# Patient Record
Sex: Female | Born: 1940
Health system: Southern US, Community
[De-identification: ages and names within clinical notes are randomized; demographics above are authoritative.]

## PROBLEM LIST (undated history)

## (undated) DIAGNOSIS — K449 Diaphragmatic hernia without obstruction or gangrene: Secondary | ICD-10-CM

## (undated) DIAGNOSIS — E222 Syndrome of inappropriate secretion of antidiuretic hormone: Secondary | ICD-10-CM

## (undated) DIAGNOSIS — Z87442 Personal history of urinary calculi: Secondary | ICD-10-CM

## (undated) DIAGNOSIS — K589 Irritable bowel syndrome without diarrhea: Secondary | ICD-10-CM

## (undated) DIAGNOSIS — K279 Peptic ulcer, site unspecified, unspecified as acute or chronic, without hemorrhage or perforation: Secondary | ICD-10-CM

## (undated) DIAGNOSIS — F419 Anxiety disorder, unspecified: Secondary | ICD-10-CM

## (undated) DIAGNOSIS — I720 Aneurysm of carotid artery: Secondary | ICD-10-CM

## (undated) DIAGNOSIS — J439 Emphysema, unspecified: Secondary | ICD-10-CM

## (undated) DIAGNOSIS — R519 Headache, unspecified: Secondary | ICD-10-CM

## (undated) DIAGNOSIS — C7931 Secondary malignant neoplasm of brain: Secondary | ICD-10-CM

## (undated) DIAGNOSIS — T4145XA Adverse effect of unspecified anesthetic, initial encounter: Secondary | ICD-10-CM

## (undated) DIAGNOSIS — C3490 Malignant neoplasm of unspecified part of unspecified bronchus or lung: Secondary | ICD-10-CM

## (undated) DIAGNOSIS — J449 Chronic obstructive pulmonary disease, unspecified: Secondary | ICD-10-CM

## (undated) DIAGNOSIS — J189 Pneumonia, unspecified organism: Secondary | ICD-10-CM

## (undated) DIAGNOSIS — R0602 Shortness of breath: Secondary | ICD-10-CM

## (undated) DIAGNOSIS — Z9981 Dependence on supplemental oxygen: Secondary | ICD-10-CM

## (undated) DIAGNOSIS — D500 Iron deficiency anemia secondary to blood loss (chronic): Secondary | ICD-10-CM

## (undated) DIAGNOSIS — I5189 Other ill-defined heart diseases: Secondary | ICD-10-CM

## (undated) DIAGNOSIS — F329 Major depressive disorder, single episode, unspecified: Secondary | ICD-10-CM

## (undated) DIAGNOSIS — C801 Malignant (primary) neoplasm, unspecified: Secondary | ICD-10-CM

## (undated) DIAGNOSIS — K5790 Diverticulosis of intestine, part unspecified, without perforation or abscess without bleeding: Secondary | ICD-10-CM

## (undated) DIAGNOSIS — IMO0002 Reserved for concepts with insufficient information to code with codable children: Secondary | ICD-10-CM

## (undated) DIAGNOSIS — I719 Aortic aneurysm of unspecified site, without rupture: Secondary | ICD-10-CM

## (undated) DIAGNOSIS — E039 Hypothyroidism, unspecified: Secondary | ICD-10-CM

## (undated) DIAGNOSIS — F32A Depression, unspecified: Secondary | ICD-10-CM

## (undated) DIAGNOSIS — G500 Trigeminal neuralgia: Secondary | ICD-10-CM

## (undated) HISTORY — PX: CHOLECYSTECTOMY: SHX55

## (undated) HISTORY — DX: Emphysema, unspecified: J43.9

## (undated) HISTORY — DX: Peptic ulcer, site unspecified, unspecified as acute or chronic, without hemorrhage or perforation: K27.9

## (undated) HISTORY — DX: Other ill-defined heart diseases: I51.89

## (undated) HISTORY — DX: Irritable bowel syndrome, unspecified: K58.9

## (undated) HISTORY — DX: Chronic obstructive pulmonary disease, unspecified: J44.9

## (undated) HISTORY — PX: FRACTURE SURGERY: SHX138

## (undated) HISTORY — DX: Malignant neoplasm of unspecified part of unspecified bronchus or lung: C34.90

## (undated) HISTORY — PX: OTHER SURGICAL HISTORY: SHX169

## (undated) HISTORY — DX: Aneurysm of carotid artery: I72.0

## (undated) HISTORY — DX: Diverticulosis of intestine, part unspecified, without perforation or abscess without bleeding: K57.90

## (undated) HISTORY — DX: Syndrome of inappropriate secretion of antidiuretic hormone: E22.2

## (undated) HISTORY — DX: Trigeminal neuralgia: G50.0

## (undated) HISTORY — DX: Depression, unspecified: F32.A

## (undated) HISTORY — DX: Anxiety disorder, unspecified: F41.9

## (undated) HISTORY — PX: CATARACT EXTRACTION: SUR2

## (undated) HISTORY — DX: Aortic aneurysm of unspecified site, without rupture: I71.9

## (undated) HISTORY — DX: Hypothyroidism, unspecified: E03.9

## (undated) HISTORY — PX: BRAIN SURGERY: SHX531

## (undated) HISTORY — PX: ABDOMINAL HYSTERECTOMY: SHX81

## (undated) HISTORY — DX: Malignant (primary) neoplasm, unspecified: C80.1

## (undated) HISTORY — DX: Diaphragmatic hernia without obstruction or gangrene: K44.9

## (undated) HISTORY — PX: TUBAL LIGATION: SHX77

## (undated) HISTORY — DX: Iron deficiency anemia secondary to blood loss (chronic): D50.0

## (undated) HISTORY — PX: KNEE SURGERY: SHX244

## (undated) HISTORY — DX: Major depressive disorder, single episode, unspecified: F32.9

## (undated) HISTORY — DX: Secondary malignant neoplasm of brain: C79.31

---

## 2001-04-17 ENCOUNTER — Other Ambulatory Visit: Admission: RE | Admit: 2001-04-17 | Discharge: 2001-04-17 | Payer: Self-pay | Admitting: Family Medicine

## 2007-08-16 ENCOUNTER — Emergency Department (HOSPITAL_COMMUNITY): Admission: EM | Admit: 2007-08-16 | Discharge: 2007-08-16 | Payer: Self-pay | Admitting: Emergency Medicine

## 2008-12-06 LAB — HM DEXA SCAN

## 2009-06-18 IMAGING — CR DG WRIST COMPLETE 3+V*L*
4 series · 4 of 4 positions shown · non-contrast
Comparison: none

CLINICAL DATA: Fall.   Left wrist injury and pain.
LEFT WRIST:

[view not recorded (1 of 4)]
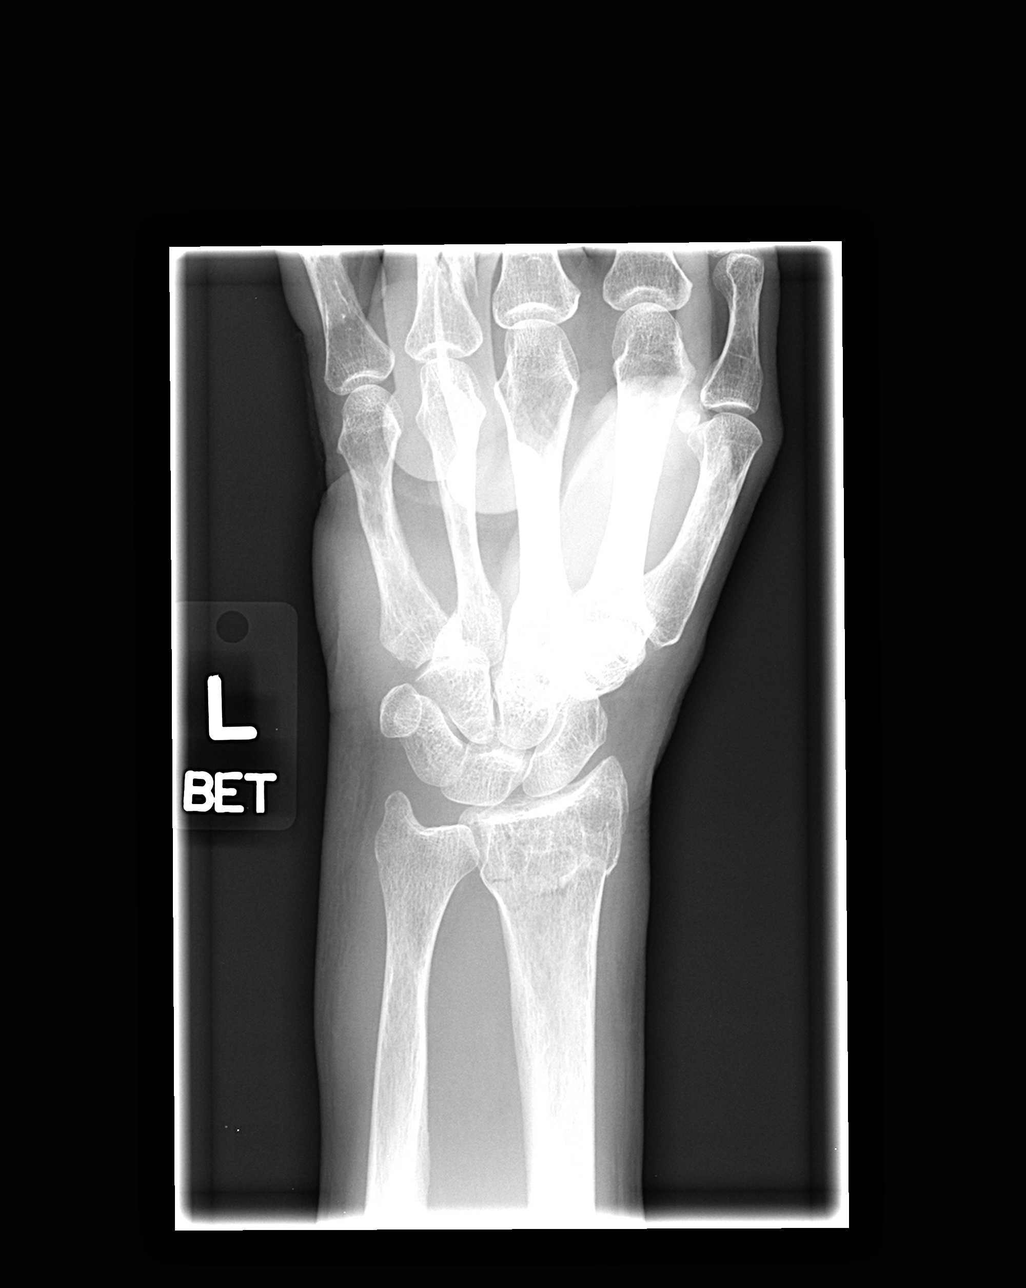

[view not recorded (2 of 4)]
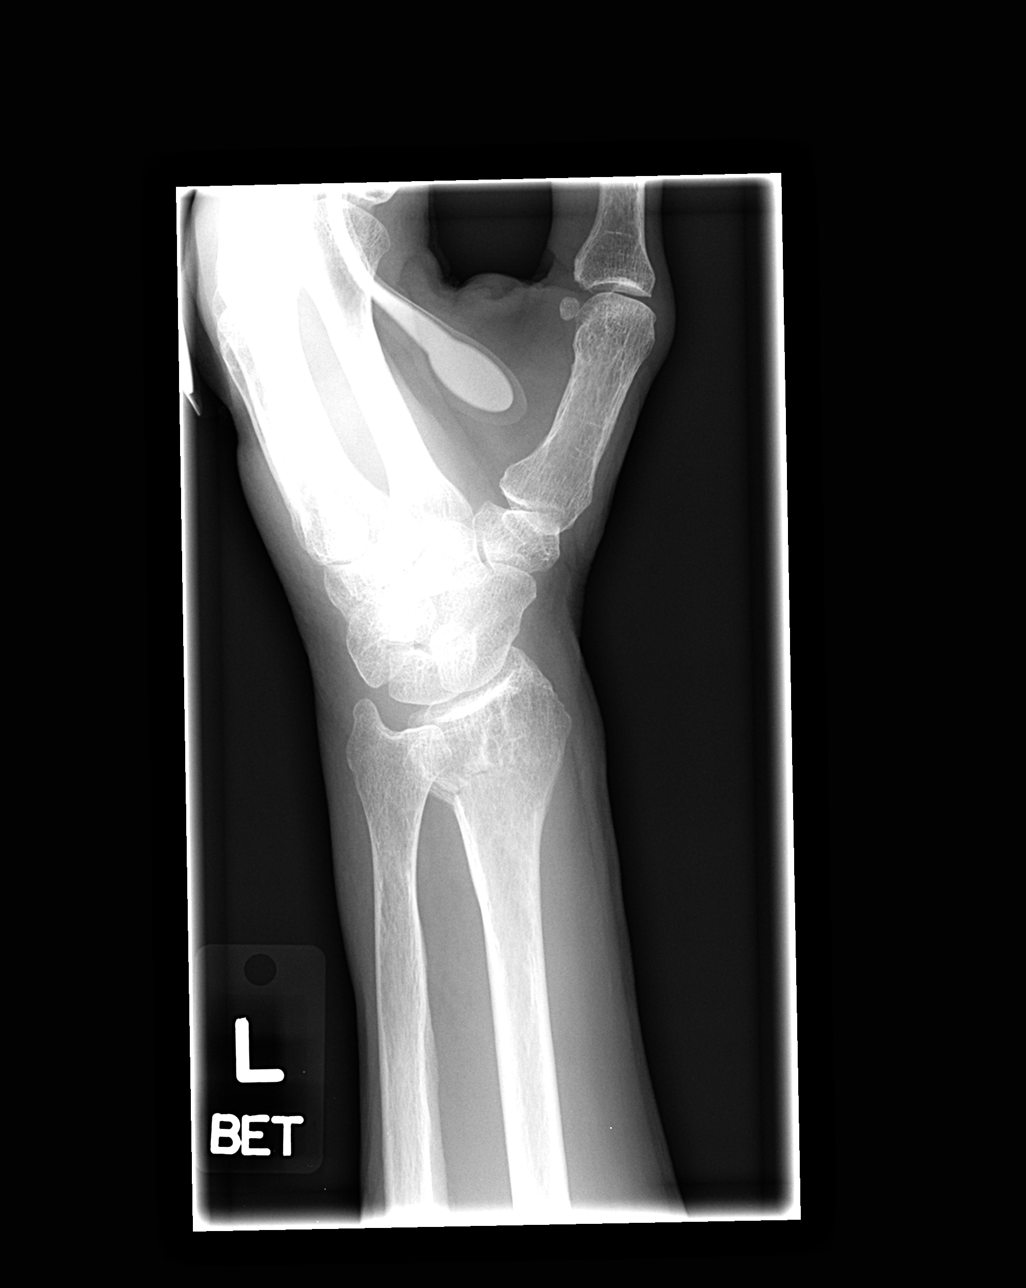

[view not recorded (3 of 4)]
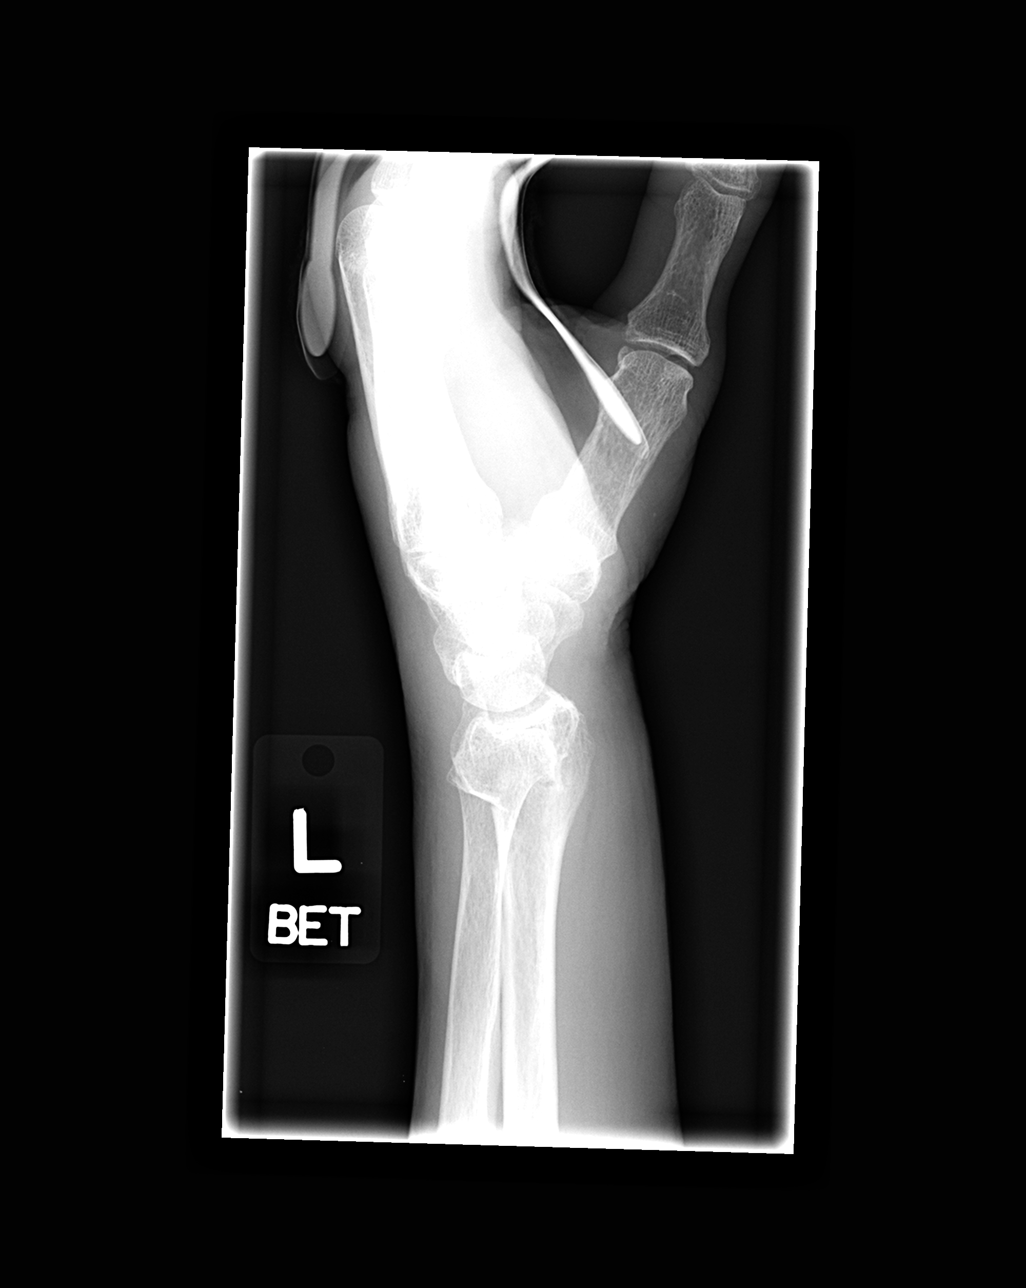

[view not recorded (4 of 4)]
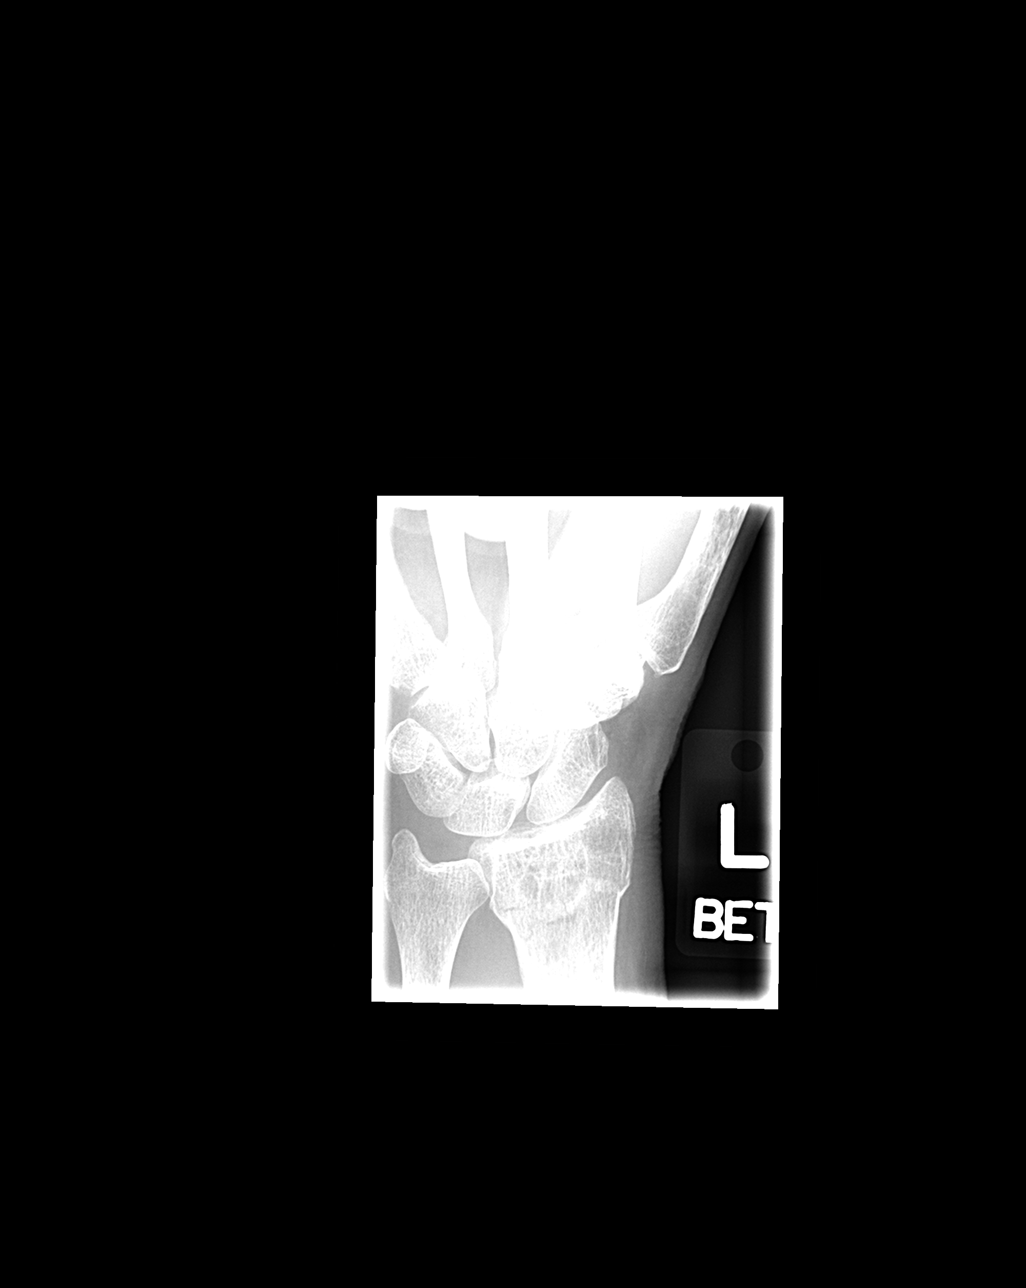

[4 of 4 positions shown; findings below may reference images not displayed]

FINDINGS: A fracture of the distal radial metaphysis is seen with extension into the distal radial and ulnar joint.  There is mild dorsal angulation of the distal articular surface of the radius.  No other fractures are identified and there is no evidence of dislocation.
IMPRESSION: Distal radial metaphyseal fracture, with involvement of the distal radial-ulnar joint and dorsal angulation.

## 2009-06-18 IMAGING — CR DG NASAL BONES 3+V
3 series · 3 of 3 positions shown · non-contrast
Comparison: none

CLINICAL DATA: Fall. Nasal trauma and pain.
 NASAL BONES ? 3 VIEW:

[view not recorded (1 of 3)]
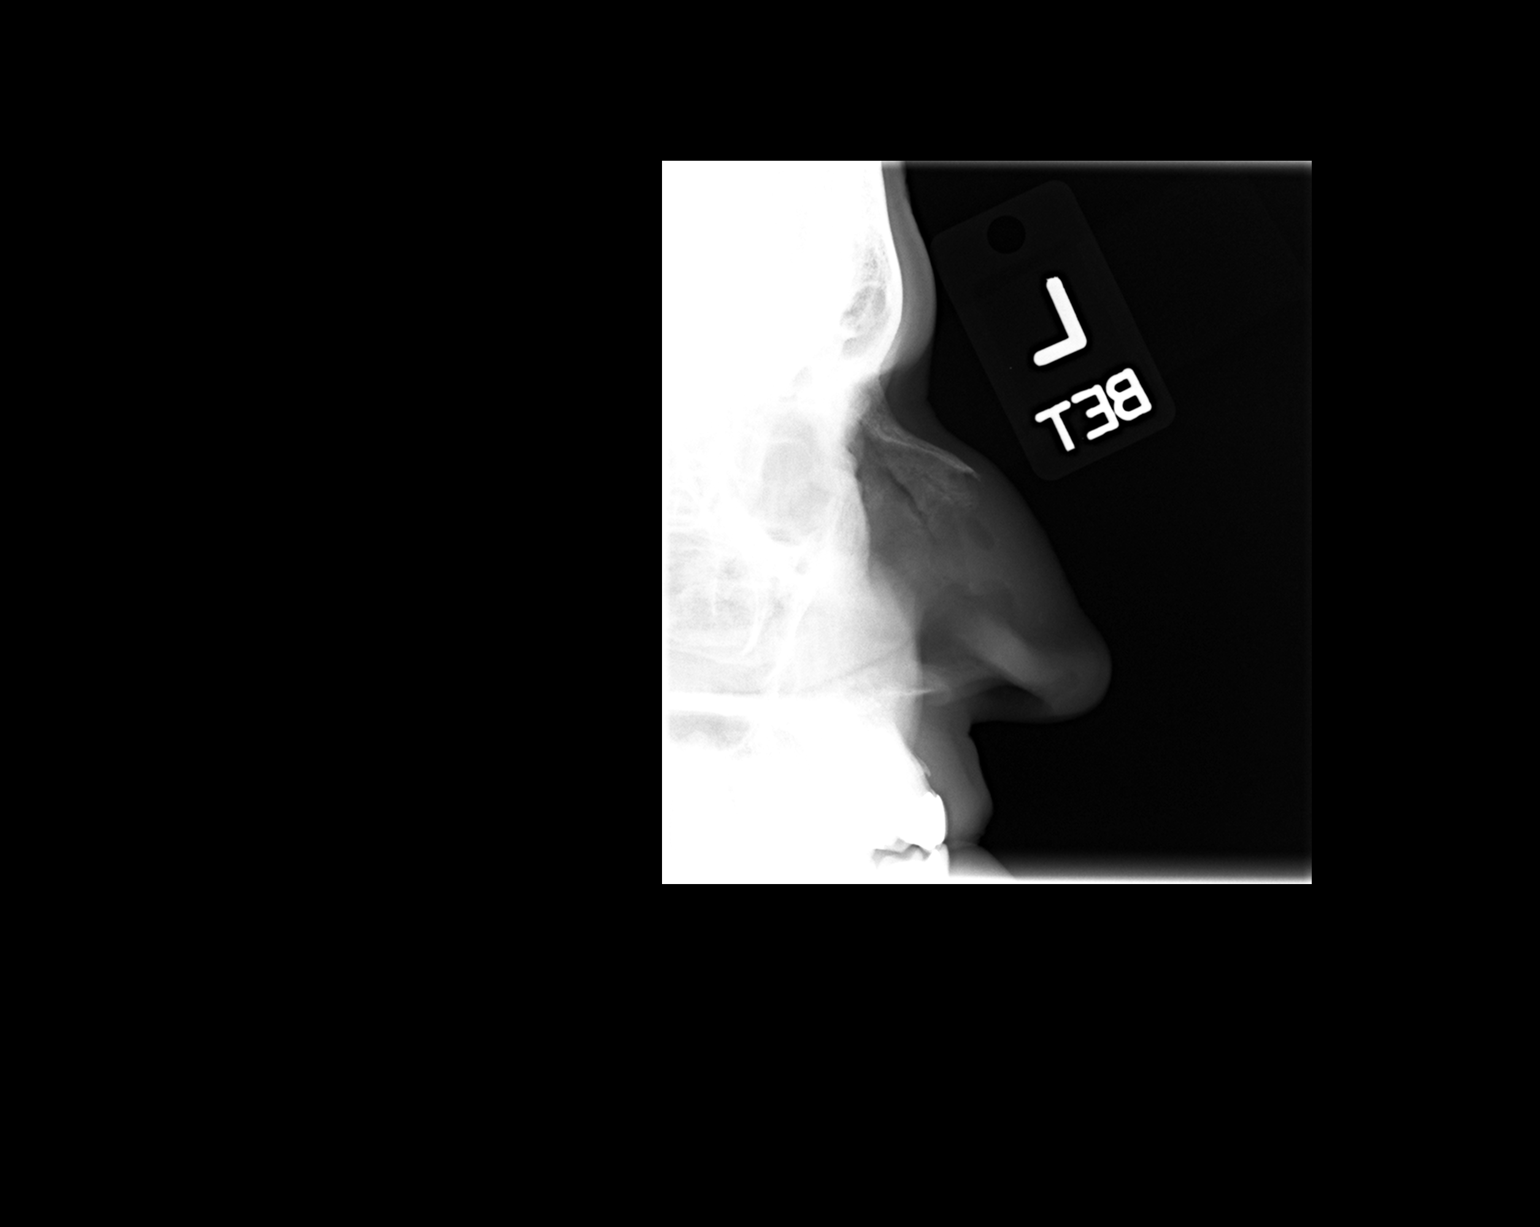

[view not recorded (2 of 3)]
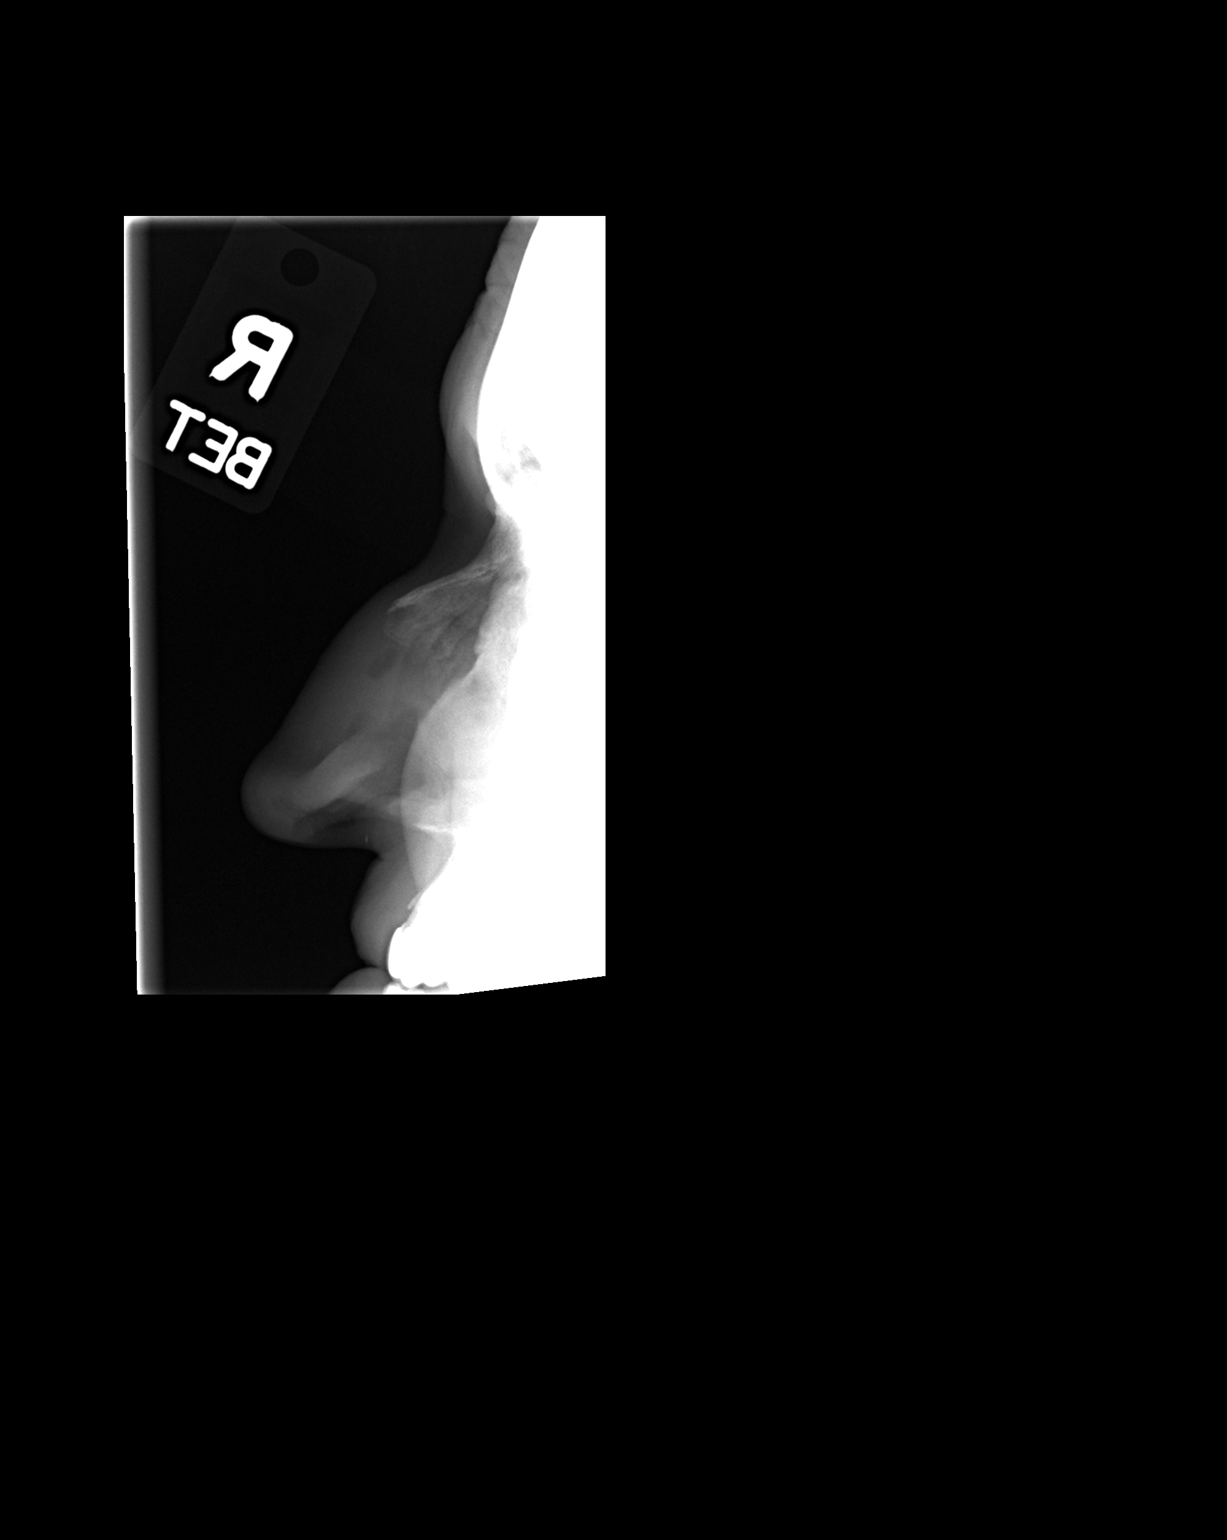

[view not recorded (3 of 3)]
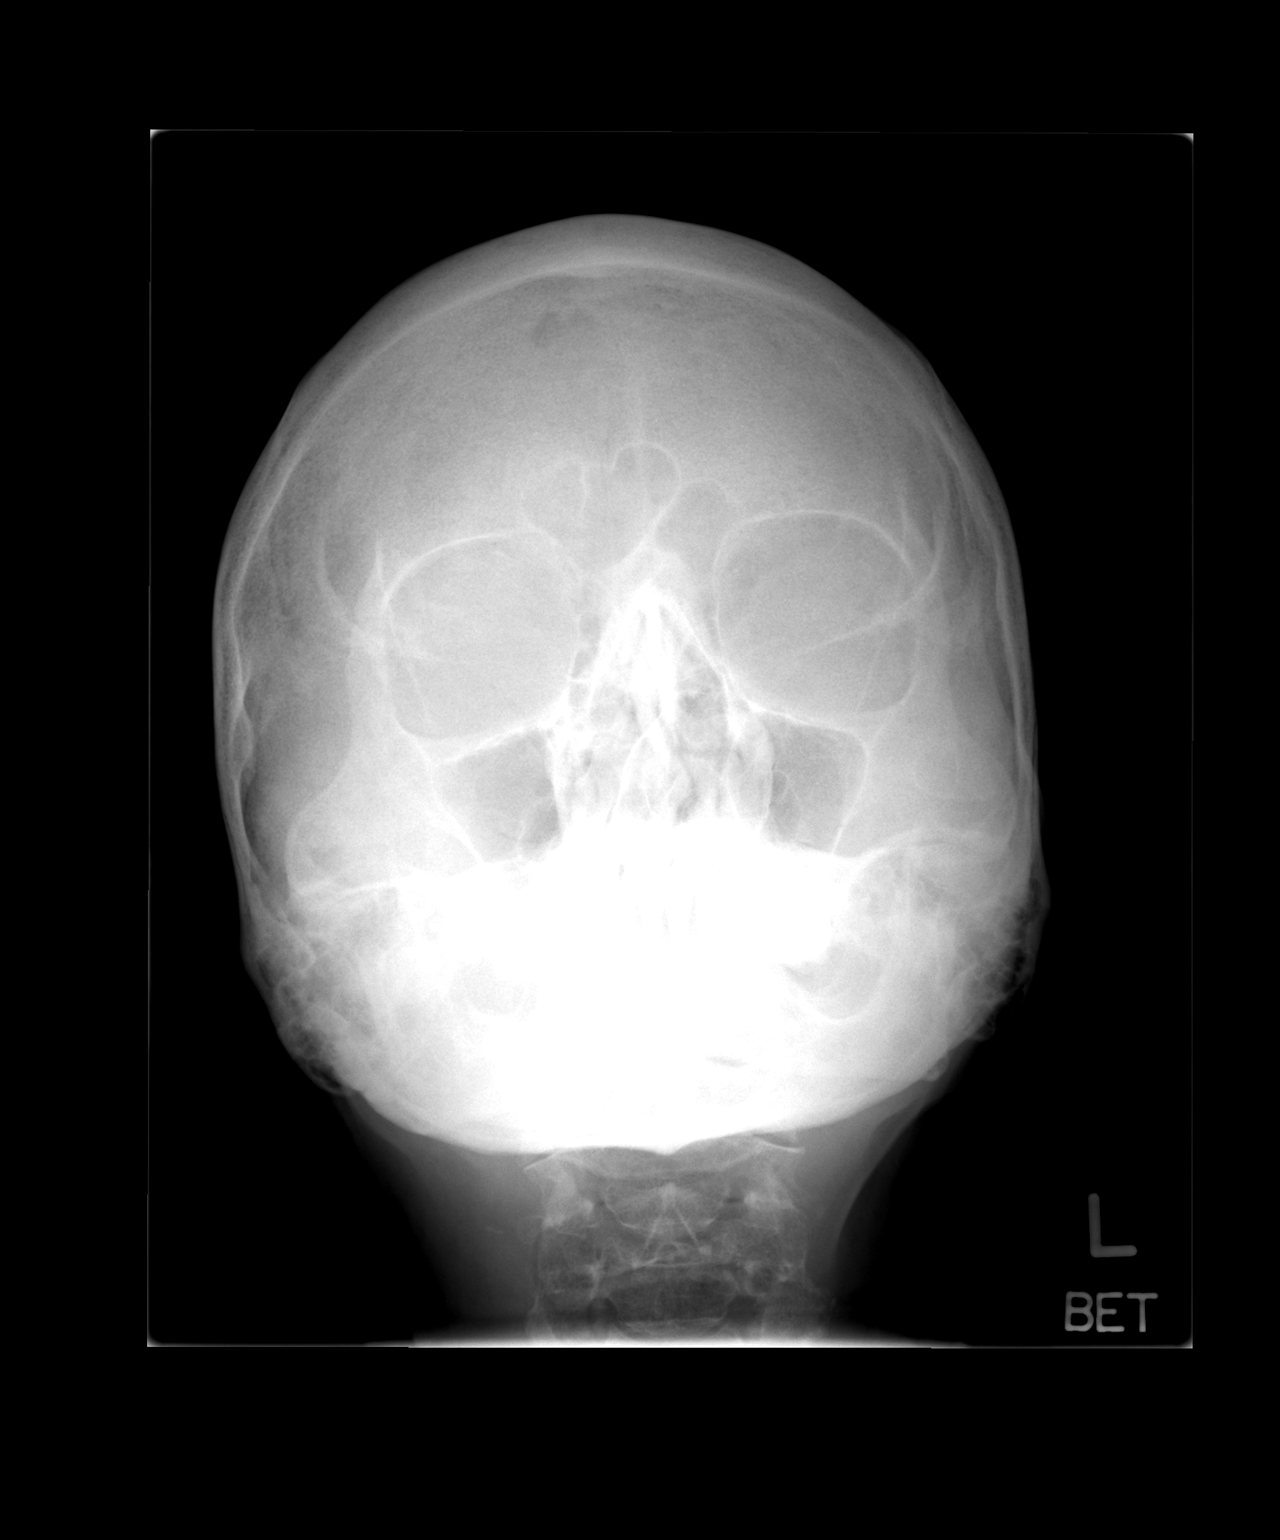

[3 of 3 positions shown; findings below may reference images not displayed]

FINDINGS: There is no evidence of nasal bone fracture.  Anterior maxillary spine is intact.
IMPRESSION: No evidence of fracture.

## 2009-07-08 LAB — HM MAMMOGRAPHY

## 2009-10-08 DIAGNOSIS — T8859XA Other complications of anesthesia, initial encounter: Secondary | ICD-10-CM

## 2009-10-08 DIAGNOSIS — T4145XA Adverse effect of unspecified anesthetic, initial encounter: Secondary | ICD-10-CM

## 2009-10-08 HISTORY — DX: Adverse effect of unspecified anesthetic, initial encounter: T41.45XA

## 2009-10-08 HISTORY — DX: Other complications of anesthesia, initial encounter: T88.59XA

## 2009-10-08 HISTORY — PX: COLONOSCOPY: SHX174

## 2009-11-01 ENCOUNTER — Ambulatory Visit: Payer: Self-pay | Admitting: Gastroenterology

## 2009-11-01 DIAGNOSIS — R1310 Dysphagia, unspecified: Secondary | ICD-10-CM | POA: Insufficient documentation

## 2009-11-09 ENCOUNTER — Encounter: Payer: Self-pay | Admitting: Gastroenterology

## 2009-11-11 ENCOUNTER — Ambulatory Visit (HOSPITAL_COMMUNITY): Admission: RE | Admit: 2009-11-11 | Discharge: 2009-11-11 | Payer: Self-pay | Admitting: Gastroenterology

## 2009-11-11 ENCOUNTER — Ambulatory Visit: Payer: Self-pay | Admitting: Gastroenterology

## 2009-11-13 HISTORY — PX: ESOPHAGOGASTRODUODENOSCOPY: SHX1529

## 2009-11-14 ENCOUNTER — Encounter: Payer: Self-pay | Admitting: Gastroenterology

## 2009-11-18 ENCOUNTER — Telehealth (INDEPENDENT_AMBULATORY_CARE_PROVIDER_SITE_OTHER): Payer: Self-pay

## 2009-11-21 ENCOUNTER — Ambulatory Visit (HOSPITAL_COMMUNITY): Admission: RE | Admit: 2009-11-21 | Discharge: 2009-11-21 | Payer: Self-pay | Admitting: Gastroenterology

## 2009-11-21 ENCOUNTER — Ambulatory Visit: Payer: Self-pay | Admitting: Gastroenterology

## 2009-11-21 HISTORY — PX: ESOPHAGOGASTRODUODENOSCOPY: SHX1529

## 2009-12-30 ENCOUNTER — Ambulatory Visit: Payer: Self-pay | Admitting: Gastroenterology

## 2009-12-30 DIAGNOSIS — Z91041 Radiographic dye allergy status: Secondary | ICD-10-CM | POA: Insufficient documentation

## 2009-12-30 DIAGNOSIS — Z8719 Personal history of other diseases of the digestive system: Secondary | ICD-10-CM | POA: Insufficient documentation

## 2010-01-06 ENCOUNTER — Telehealth (INDEPENDENT_AMBULATORY_CARE_PROVIDER_SITE_OTHER): Payer: Self-pay

## 2010-03-28 ENCOUNTER — Telehealth (INDEPENDENT_AMBULATORY_CARE_PROVIDER_SITE_OTHER): Payer: Self-pay

## 2010-03-28 ENCOUNTER — Encounter: Payer: Self-pay | Admitting: Gastroenterology

## 2010-08-03 ENCOUNTER — Ambulatory Visit: Payer: Self-pay | Admitting: Gastroenterology

## 2010-08-22 ENCOUNTER — Telehealth (INDEPENDENT_AMBULATORY_CARE_PROVIDER_SITE_OTHER): Payer: Self-pay

## 2010-10-31 ENCOUNTER — Encounter
Admission: RE | Admit: 2010-10-31 | Discharge: 2010-10-31 | Payer: Self-pay | Source: Home / Self Care | Attending: Family Medicine | Admitting: Family Medicine

## 2010-11-01 ENCOUNTER — Encounter
Admission: RE | Admit: 2010-11-01 | Discharge: 2010-11-01 | Payer: Self-pay | Source: Home / Self Care | Attending: Family Medicine | Admitting: Family Medicine

## 2010-11-07 ENCOUNTER — Other Ambulatory Visit: Payer: Self-pay | Admitting: Thoracic Surgery

## 2010-11-07 ENCOUNTER — Ambulatory Visit
Admission: RE | Admit: 2010-11-07 | Discharge: 2010-11-07 | Payer: Self-pay | Source: Home / Self Care | Attending: Thoracic Surgery | Admitting: Thoracic Surgery

## 2010-11-07 DIAGNOSIS — R918 Other nonspecific abnormal finding of lung field: Secondary | ICD-10-CM

## 2010-11-08 NOTE — Letter (Signed)
November 07, 2010  Priscille Heidelberg. Pamalee Leyden, MD [address] [place], [place] [address]  Re:  BARBETTE, MCGLAUN                 DOB:  03-21-1941  Dear Dr. Tanya Nones:  I appreciate the opportunity of seeing this 70 year old smoker, had a history of left chest pain and cough, and no fever or chills, and was thought to have a left-sided pleurisy.  She underwent a chest x-ray which was normal on November 06, 2010 , but because of continued pain she had a CT scan to rule out pulmonary embolus and showed a 2-cm spiculated mass in the left lower lobe.  She is referred here for evaluation.  She has had no hemoptysis, fever, chills, excessive sputum.  PAST MEDICAL HISTORY:  Significant for trigeminal neuralgia on the left side and hypothyroidism.  She has vitamin D deficiency.  She also has history of tobacco abuse, diverticulitis.  MEDICATIONS: 1. Levothyroxine 25 mcg day. 2. Fosamax 70 mg weekly. 3. Celexa 40 mg daily. 4. Albuterol p.r.n.  ALLERGIES:  She is allergic to sulfa and contrast dyes.  SOCIAL HISTORY:  She had a previous hysterectomy, cholecystectomy, and a Bartholin gland cyst.  FAMILY HISTORY:  Noncontributory.  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 Smokes half to one pack a day.  Does not drink alcohol on a regular basis.  REVIEW OF SYSTEMS:  GENERAL:  She is 5 feet 5-1/2 inches.  In general, her weight has been stable. CARDIAC:  She has no angina or atrial fibrillation. PULMONARY:  See history of present illness.  No hemoptysis. GI:  No nausea, vomiting, constipation, or diarrhea. GU:  No kidney disease, dysuria, or frequent urination. VASCULAR:  No claudication, DVT, TIAs. NEUROLOGIC:  No dizziness, headaches, blackouts, or seizures. MUSCULOSKELETAL:  No arthritis. PSYCHIATRIC:  No depression or nervousness. ENT:  No change in eyesight or hearing. HEMATOLOGIC:  No problems with bleeding, clotting disorders, or anemia.  PHYSICAL EXAMINATION:   General:  She had a thin Caucasian female in no acute distress.  Vital Signs:  Her blood pressure is 115/68, pulse 76, respirations 20, sats were 92% on room air.  Head, Eyes, Ears, Nose, and Throat:  Unremarkable.  Neck:  Supple without thyromegaly.  Chest: There are bilateral wheezes and increased AP diameter and distant breath sounds.  Heart:  Regular sinus rhythm.  No murmurs.  Abdomen:  Soft. Extremities:  Pulses are 2+.  There is no clubbing or edema. Neurologic:  She is oriented x3.  Sensory and motor intact.  Cranial nerves intact.  ASSESSMENT AND PLAN:  I am somewhat concerned about this left lower lobe nodule, particularly in an ongoing smoker.  I think to workup we need to do a PET scan and a pulmonary function test with DLCO.  I will see her back again after that to make further recommendations.  I appreciate the opportunity of seeing the patient.  Sincerely,  Ines Bloomer, M.D. Electronically Signed  DPB/MEDQ  D:  11/07/2010  T:  11/08/2010  Job:  [id number]

## 2010-11-09 NOTE — Letter (Signed)
Summary: TCS/EGD ORDER  TCS/EGD ORDER   Imported By: Ricard Dillon 11/09/2009 12:42:09  _____________________________________________________________________  External Attachment:    Type:   Image     Comment:   External Document

## 2010-11-09 NOTE — Progress Notes (Signed)
Summary: omeprazole rx  Phone Note Call from Patient Call back at Home Phone [phone] Call back at [phone]   Caller: Patient Summary of Call: pt called- she is requesting her omeprazole Rx sent to [facility] for mail order. The fax number for them is [phone] Initial call taken by: Hendricks Limes LPN,  August 22, 2010 11:57 AM     Appended Document: omeprazole rx    Prescriptions: OMEPRAZOLE 20 MG CPDR (OMEPRAZOLE) 1 by mouth every morning  #90 x 3   Entered and Authorized by:   Joselyn Arrow FNP-BC   Signed by:   Joselyn Arrow FNP-BC on 08/22/2010   Method used:   Printed then faxed to ...       [facility]  [address] [phone]* (retail)       [address]       [place], [place]  [address]       Ph: [phone] or [phone]       Fax: [phone]   RxID:   [id number]     Appended Document: omeprazole rx rx faxed to fax number given by pt.

## 2010-11-09 NOTE — Miscellaneous (Signed)
Summary: Orders Update  Clinical Lists Changes  Orders: Added new Test order of T-Helicobactor Pylori Antigen Stool ([id number]) - Signed 

## 2010-11-09 NOTE — Assessment & Plan Note (Signed)
Summary: DYSPHAGIA, SCREENING   Visit Type:  Follow-up Visit Primary Care Provider:  Dr. Tanya Nones  Chief Complaint:  dysphagia.  History of Present Illness: Problems swallowing-don't do bread or dry meat well. Problems: none if doesn't eat bread.  Eating cheerios instead ground flax seed. no problems with heartburn. Takign OMP once a day.   Current Medications (verified): 1)  Levothyroxine Sodium 25 Mcg Tabs (Levothyroxine Sodium) .... Once Daily 2)  Celexa 20 Mg Tabs (Citalopram Hydrobromide) .... Once Daily 3)  Vitamin D 4)  Fosamax 70 Mg Tabs (Alendronate Sodium) .... Q Week 5)  Omeprazole 20 Mg Cpdr (Omeprazole) .Marland Kitchen.. 1 By Mouth Every Morning  Allergies (verified): 1)  ! Sulfa 2)  * Contrast Dye  Past History:  Past Medical History: COPD Anxiety Disorder, ON CELEXA FOR 2 YEARS Hypothyroidism PUD 30 years TCS 1997-no polyps, or problems TCS 2011-hyperplastic polyp DYSPHAGIA **ESOPHAGEAL WEB-EGD/DIL 2011  Vital Signs:  Patient profile:   70 year old female Height:      65 inches Weight:      133.50 pounds BMI:     22.30 Temp:     98.6 degrees F oral Pulse rate:   72 / minute BP sitting:   120 / 80  (left arm) Cuff size:   regular  Vitals Entered By: Cloria Spring LPN (August 03, 2010 4:16 PM)  Physical Exam  General:  Well developed, well nourished, no acute distress. Head:  Normocephalic and atraumatic. Lungs:  Clear throughout to auscultation. Heart:  Regular rate and rhythm; no murmurs. Abdomen:  Soft, nontender and nondistended. Normal bowel sounds.  Impression & Recommendations:  Problem # 1:  DYSPHAGIA UNSPECIFIED (ICD-787.20)  Avoid bread and dry meat. Continue OMP. Consider BPE if Sx get worse. OPV in 6 mos.   Orders: Est. Patient Level II ([address])  Problem # 2:  SCREENING, COLON CANCER (ICD-V76.51) Assessment: Comment Only TCS in 10-15 years in benefits outweigh the risks.  CC: PCP  Appended Document: DYSPHAGIA, SCREENING TCS REMINDER IS  IN THE COMPUTER

## 2010-11-09 NOTE — Assessment & Plan Note (Signed)
Summary: H. PYLORI, DYSPHAGIA   Visit Type:  Follow-up Visit Primary Care Provider:  Tanya Nones, M.D.  Chief Complaint:  follow up.  History of Present Illness: Swallowing is better. Stomach is better. Sometimes she feels soo empty. Some days worse than others. Eats 3 meals a days and ?something in middle. Started Fosamax.   Current Medications (verified): 1)  Levothyroxine Sodium 25 Mcg Tabs (Levothyroxine Sodium) .... Once Daily 2)  Celexa 20 Mg Tabs (Citalopram Hydrobromide) .... Once Daily 3)  Vitamin D 4)  Fosamax 70 Mg Tabs (Alendronate Sodium) .... Q Week  Allergies (verified): 1)  ! Sulfa 2)  * Contrast Dye  Past History:  Past Medical History: COPD Anxiety Disorder, ON CELEXA FOR 2 YEARS Hypothyroidism PUD 30 years TCS 1997-no polyps, or problems TCS 2011-hyperplastic polyp ESOPHAGEAL WEB-EGD/DIL 2011  Vital Signs:  Patient profile:   70 year old female Height:      65 inches Weight:      122 pounds BMI:     20.38 Temp:     97.7 degrees F oral Pulse rate:   72 / minute BP sitting:   110 / 70  (left arm) Cuff size:   regular  Vitals Entered By: Hendricks Limes LPN (December 30, 2009 9:36 AM)  Physical Exam  General:  Well developed, well nourished, no acute distress. Head:  Normocephalic and atraumatic. Lungs:  Clear throughout to auscultation. Heart:  Regular rate and rhythm; no murmurs. Abdomen:  Soft, nontender and nondistended. Normal bowel sounds. Neurologic:  Alert and  oriented x4;  grossly normal neurologically.  Impression & Recommendations:  Problem # 1:  HELICOBACTER PYLORI GASTRITIS, HX OF (ICD-V12.79) Assessment Improved  Finished Abx. Continue OMP daily. OPV in 6 mos. encouarged pt to snack between meals to maintain weight.  Orders: Est. Patient Level II ([address])  Problem # 2:  DYSPHAGIA UNSPECIFIED (ICD-787.20) Assessment: Improved  2o to esophageal web. PT HAD LOW BP WITH EGD THE FIRST TIME: DEM 75/V3, BUT NOT THE SECOND TIME: DEM  25/V3. Repeat dilations as needed. Drink plenty of water with Fosamax and stay upright for 30 minutes. OPV in 6 mos.  CC: PCP  Orders: Est. Patient Level II ([address])  Problem # 3:  SCREENING, COLON CANCER (ICD-V76.51) Assessment: Comment Only HYPERPLASTIC POLYP 2011. TCS IN 10-15 YEARS IF BENEFITS OUTWEIGH THE RISKS. Prescriptions: OMEPRAZOLE 20 MG CPDR (OMEPRAZOLE) 1 by mouth every morning  #30 x 5   Entered and Authorized by:   West Bali MD   Signed by:   West Bali MD on 12/30/2009   Method used:   [facility] to        [facility]. [phone]* (retail)       [address]       [place], [place]  [address]       Ph: [phone] or [phone]       Fax: [phone]   RxID:   [id number]   Appended Document: H. PYLORI, DYSPHAGIA NOV 2010 123 LBS, PMHX: IBS     CR 0.65 ALB 4.6 AST 17 ALT 12 HB 14.9 PLT 331 TSH 4.050

## 2010-11-09 NOTE — Progress Notes (Signed)
Summary: abd pain  Phone Note Call from Patient Call back at Home Phone [phone]   Caller: Patient Summary of Call: pt called- stated she was having alot of abd pain. wants to know if there are any other recomendations that she can try? change in meds? please advise Initial call taken by: Hendricks Limes LPN,  March 28, 2010 1:08 PM     Appended Document: abd pain Please call pt. She should continue omeprazole two times a day and avoid gastric irritants. Will check an H. pylori stool antigen to make sure her stomach infection is gone. She may use Maalox or Mylanta prn for additonal pain relief.  Appended Document: abd pain pt aware, stool container and order at front desk. Included copy of gastric irritant HO.

## 2010-11-09 NOTE — Progress Notes (Signed)
Summary: Pt concerned about BP drop when had last EGD  Phone Note Call from Patient   Caller: Patient Summary of Call: Pt called and is very anxious about having another EGD. She said her BP dropped quite a bit last time and she is worried about having another one. Said she has not felt good since the procedure. She said she had some chest congestion, and i recommended she see her PCP for that. She wants to know does Dr. Darrick Penna still recommend her doing it Monday, and what to expect about BP dropping again. Please advise. OK to leave message on machine. Initial call taken by: Cloria Spring LPN,  November 18, 2009 10:31 AM     Appended Document: Pt concerned about BP drop when had last EGD Called pt. She's in with her doctor. Will call to discuss.   Appended Document: Pt concerned about BP drop when had last EGD Spoke with pt. Reassured her we will monitor her BP and give her plenty of lfuids. Low BP likely 2o to to combined procedure, meds, and mild volume depletion. EGD MON.

## 2010-11-09 NOTE — Letter (Signed)
Summary: EGD ORDER  EGD ORDER   Imported By: Ave Filter 11/14/2009 09:14:49  _____________________________________________________________________  External Attachment:    Type:   Image     Comment:   External Document

## 2010-11-09 NOTE — Progress Notes (Signed)
Summary: abd pain  Phone Note Call from Patient Call back at Home Phone [phone]   Caller: Patient Summary of Call: pt called- has started a new job on Monday and ever since she started it she has been having alot of pain in her stomach everytime she eats with some nausea.She said it just started this week and that she was fine last week when she had her OV. Pt wants to know if there is anything else she can do?please advise Initial call taken by: Hendricks Limes LPN,  January 06, 2010 11:56 AM     Appended Document: abd pain Please call pt. She should continue omeprazole and avoid gastric irritants.  Appended Document: abd pain left detailed message on machine. pt stated it was ok to do so.

## 2010-11-09 NOTE — Assessment & Plan Note (Signed)
Summary: DYSPHAGIA   Visit Type:  Initial Consult Primary Care Provider:  Tanya Nones, M.D.  Chief Complaint:  difficulty swallowing and needs tcs too.  History of Present Illness: Been on Fosamax for 2 years. Problems swallowing bread and meats since  1 years. Right now a little bit more. No weight loss since daughter had cancer. No heartburn or indigestion.  Only pain if feel like food gets stuck. Christmas morning nausea. No vomiting.  Pt had pain after eating in upper abd approximately 1 month ago. Last TCS 13 years ago. Congested for pat 2-3 weeks  Preventive Screening-Counseling & Management  Alcohol-Tobacco     Smoking Status: current  Current Medications (verified): 1)  Levothyroxine Sodium 25 Mcg Tabs (Levothyroxine Sodium) .... Once Daily 2)  Celexa 20 Mg Tabs (Citalopram Hydrobromide) .... Once Daily 3)  Vitamin D 4)  Fosamax 70 Mg Tabs (Alendronate Sodium) .... Q Week  Allergies (verified): 1)  ! Sulfa  Past History:  Past Medical History: COPD Anxiety Disorder, ON CELEXA FOR 2 YEARS Hypothyroidism PUD 30 years TCS 1997-no polyps, or problems  Family History: No FH of Colon Cancer OR POLYPS Mom had osetoporosis Sister had liver CA Family History of Breast Cancer: daughter No Family History of Ovarian Cancer: No Family History of Pancreatic Cancer: No Family History of Stomach Cancer: No Family History of Uterine Cancer:  Social History: Occupation: unemployed, worked at [employer] now laid off from [employer] Patient currently smokes: 1 pk/d to 1+ Divorced, 3 kids: youngest: 55 yo twins Smoking Status:  current  Review of Systems  The patient denies chest pain, melena, and hematochezia.         Per HPI otherwise all systems negative.  Vital Signs:  Patient profile:   70 year old female Height:      65 inches Weight:      121 pounds BMI:     20.21 Temp:     98.0 degrees F oral Pulse rate:   72 / minute BP sitting:   102 / 60  (right arm) Cuff  size:   regular  Vitals Entered By: Hendricks Limes LPN (November 01, 2009 3:18 PM)  Physical Exam  General:  Well developed, well nourished, no acute distress. Head:  Normocephalic and atraumatic. Eyes:  PERRLA, no icterus. Mouth:  No deformity or lesions. Neck:  Supple; no masses. Lungs:  Clear throughout to auscultation. Heart:  Regular rate and rhythm; no murmurs Abdomen:  Soft, nontender and nondistended.  Normal bowel sounds. thin.   Extremities:  No edema or deformities noted. Neurologic:  Alert and  oriented x4;  grossly normal neurologically.  Impression & Recommendations:  Problem # 1:  DYSPHAGIA UNSPECIFIED (ICD-787.20) Assessment Unchanged  Solid dysphagia-differential include mass, stricture, ring or 1o esophageal motility disorder.  OPV in 3 mos.  CC: PCP  Orders: New Patient Level III ([address])  Problem # 2:  SCREENING, COLON CANCER (ICD-V76.51) Assessment: Unchanged  TCS next week. Prep instruction given.   Orders: New Patient Level III [phone])

## 2010-11-13 ENCOUNTER — Ambulatory Visit (HOSPITAL_COMMUNITY)
Admission: RE | Admit: 2010-11-13 | Discharge: 2010-11-13 | Disposition: A | Discharge status: Home / Self Care | Payer: Medicare Other | Source: Ambulatory Visit | Attending: Thoracic Surgery | Admitting: Thoracic Surgery

## 2010-11-13 DIAGNOSIS — J984 Other disorders of lung: Secondary | ICD-10-CM | POA: Insufficient documentation

## 2010-11-13 DIAGNOSIS — R918 Other nonspecific abnormal finding of lung field: Secondary | ICD-10-CM

## 2010-11-13 LAB — GLUCOSE, CAPILLARY: Glucose-Capillary: 85 mg/dL (ref 70–99)

## 2010-11-13 MED ORDER — FLUDEOXYGLUCOSE F - 18 (FDG) INJECTION: 18.9000 | Freq: Once | INTRAVENOUS | Status: AC | PRN

## 2010-11-15 ENCOUNTER — Ambulatory Visit (INDEPENDENT_AMBULATORY_CARE_PROVIDER_SITE_OTHER): Payer: MEDICARE | Admitting: Thoracic Surgery

## 2010-11-15 DIAGNOSIS — D491 Neoplasm of unspecified behavior of respiratory system: Secondary | ICD-10-CM

## 2010-11-17 NOTE — Letter (Signed)
November 15, 2010  Priscille Heidelberg. Pamalee Leyden, MD [address] [place], [place] [address]  Re:  [name], [name]                 DOB:  [birth year]  Dear Dr. Tanya Nones:  I appreciate the opportunity of seeing the patient.  We saw her back today after her PET scan, pulmonary function tests.  Her pulmonary function tests showed an FVC of 2.32, 71% predicted, but FEV-1 was 1.05 or 42% predicted, and a diffusion capacity corrected is 41%.  So, she has severe obstructive pulmonary disease.  Her PET scan was positive in this left lower lobe superior segmental lesion, but it was not a high rate of activity.  There also was increased uptake in her seventh and eighth ribs on the left side.  She gives no history of direct trauma to that, but this is the area where she first started having pain in her left, pain which led to her subsequent workup.  I think the next step is to do a bronchoscopy and we have generally scheduled this under electromagnetic navigation, and we will do it on November 27, 2010, at [facility].  I appreciate the opportunity of seeing the patient and I will keep you informed as her workup progresses.  Ines Bloomer, M.D. Electronically Signed  DPB/MEDQ  D:  11/15/2010  T:  11/16/2010  Job:  [id number]

## 2010-11-23 ENCOUNTER — Encounter (HOSPITAL_COMMUNITY)
Admission: RE | Admit: 2010-11-23 | Discharge: 2010-11-23 | Disposition: A | Discharge status: Home / Self Care | Payer: MEDICARE | Source: Ambulatory Visit | Attending: Thoracic Surgery | Admitting: Thoracic Surgery

## 2010-11-23 DIAGNOSIS — Z01812 Encounter for preprocedural laboratory examination: Secondary | ICD-10-CM | POA: Insufficient documentation

## 2010-11-23 DIAGNOSIS — Z01810 Encounter for preprocedural cardiovascular examination: Secondary | ICD-10-CM | POA: Insufficient documentation

## 2010-11-23 LAB — COMPREHENSIVE METABOLIC PANEL
ALT: 15 U/L (ref 0–35)
AST: 19 U/L (ref 0–37)
Albumin: 4.2 g/dL (ref 3.5–5.2)
Alkaline Phosphatase: 65 U/L (ref 39–117)
BUN: 5 mg/dL — ABNORMAL LOW (ref 6–23)
CO2: 31 mEq/L (ref 19–32)
Calcium: 10.1 mg/dL (ref 8.4–10.5)
Chloride: 99 mEq/L (ref 96–112)
Creatinine, Ser: 0.61 mg/dL (ref 0.4–1.2)
GFR calc Af Amer: >60 mL/min (ref >60)
GFR calc non Af Amer: >60 mL/min (ref >60)
Glucose, Bld: 86 mg/dL (ref 70–99)
Potassium: 4.5 mEq/L (ref 3.5–5.1)
Sodium: 138 mEq/L (ref 135–145)
Total Bilirubin: 0.3 mg/dL (ref 0.3–1.2)
Total Protein: 7 g/dL (ref 6.0–8.3)

## 2010-11-23 LAB — PROTIME-INR
INR: 0.96 (ref 0.00–1.49)
Prothrombin Time: 13 seconds (ref 11.6–15.2)

## 2010-11-23 LAB — CBC
HCT: 41.4 % (ref 36.0–46.0)
Hemoglobin: 14 g/dL (ref 12.0–15.0)
MCH: 29.9 pg (ref 26.0–34.0)
MCHC: 33.8 g/dL (ref 30.0–36.0)
MCV: 88.5 fL (ref 78.0–100.0)
Platelets: 331 10*3/uL (ref 150–400)
RBC: 4.68 MIL/uL (ref 3.87–5.11)
RDW: 13.2 % (ref 11.5–15.5)
WBC: 6.9 10*3/uL (ref 4.0–10.5)

## 2010-11-23 LAB — SURGICAL PCR SCREEN
MRSA, PCR: NEGATIVE
Staphylococcus aureus: NEGATIVE

## 2010-11-23 LAB — APTT: aPTT: 31 seconds (ref 24–37)

## 2010-11-27 ENCOUNTER — Other Ambulatory Visit: Payer: Self-pay | Admitting: Thoracic Surgery

## 2010-11-27 ENCOUNTER — Ambulatory Visit (HOSPITAL_COMMUNITY)
Admission: RE | Admit: 2010-11-27 | Discharge: 2010-11-27 | Disposition: A | Discharge status: Home / Self Care | Payer: MEDICARE | Source: Ambulatory Visit | Attending: Thoracic Surgery | Admitting: Thoracic Surgery

## 2010-11-27 ENCOUNTER — Ambulatory Visit (HOSPITAL_COMMUNITY): Payer: MEDICARE

## 2010-11-27 DIAGNOSIS — Z01818 Encounter for other preprocedural examination: Secondary | ICD-10-CM | POA: Insufficient documentation

## 2010-11-27 DIAGNOSIS — G500 Trigeminal neuralgia: Secondary | ICD-10-CM | POA: Insufficient documentation

## 2010-11-27 DIAGNOSIS — R918 Other nonspecific abnormal finding of lung field: Secondary | ICD-10-CM

## 2010-11-27 DIAGNOSIS — K219 Gastro-esophageal reflux disease without esophagitis: Secondary | ICD-10-CM | POA: Insufficient documentation

## 2010-11-27 DIAGNOSIS — K589 Irritable bowel syndrome without diarrhea: Secondary | ICD-10-CM | POA: Insufficient documentation

## 2010-11-27 DIAGNOSIS — F17200 Nicotine dependence, unspecified, uncomplicated: Secondary | ICD-10-CM | POA: Insufficient documentation

## 2010-11-27 DIAGNOSIS — C3430 Malignant neoplasm of lower lobe, unspecified bronchus or lung: Secondary | ICD-10-CM | POA: Insufficient documentation

## 2010-11-27 DIAGNOSIS — J45909 Unspecified asthma, uncomplicated: Secondary | ICD-10-CM | POA: Insufficient documentation

## 2010-11-27 DIAGNOSIS — D381 Neoplasm of uncertain behavior of trachea, bronchus and lung: Secondary | ICD-10-CM

## 2010-11-28 ENCOUNTER — Ambulatory Visit (INDEPENDENT_AMBULATORY_CARE_PROVIDER_SITE_OTHER): Payer: MEDICARE | Admitting: Thoracic Surgery

## 2010-11-28 DIAGNOSIS — C3430 Malignant neoplasm of lower lobe, unspecified bronchus or lung: Secondary | ICD-10-CM

## 2010-11-28 NOTE — Letter (Signed)
November 28, 2010  Priscille Heidelberg. Pamalee Leyden, MD [address] [place], [place] [address]  Re:  ANATALIA, KRONK                 DOB:  1940-11-08  Dear Dr. Tanya Nones:  I saw the patient back today.  Her blood pressure was 124/65, pulse 70, respirations 20, and sats were 94% on room air.  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  Her pulmonary function tests were satisfactory for a segmentectomy, although I would not recommend that she get anything more than that.  I plan to do this on December 21, 2010, at [facility].  I appreciate the opportunity of seeing the patient.  Sincerely,  Ines Bloomer, M.D. Electronically Signed  DPB/MEDQ  D:  11/28/2010  T:  11/28/2010  Job:  [id number]

## 2010-11-29 LAB — CULTURE, RESPIRATORY W GRAM STAIN
Culture: NO GROWTH
Culture: NO GROWTH

## 2010-11-29 LAB — CULTURE, RESPIRATORY

## 2010-11-29 NOTE — Op Note (Signed)
  [name], [name]                 ACCOUNT NO.:  [id number]  MEDICAL RECORD NO.:  [id number]           PATIENT TYPE:  O  LOCATION:  XRAY                         FACILITY:  [facility]  PHYSICIAN:  Ines Bloomer, M.D. DATE OF BIRTH:  1941-02-08  DATE OF PROCEDURE:  11/27/2010 DATE OF DISCHARGE:  11/27/2010                              OPERATIVE REPORT   PREOPERATIVE DIAGNOSIS:  Superior segmental left lower lobe lesion.  POSTOPERATIVE DIAGNOSIS:  Superior segmental left lower lobe lesion.  OPERATION PERFORMED:  Fiberoptic bronchoscopy with endobronchial ultrasound.  DESCRIPTION OF PROCEDURE:  After general anesthesia, the video bronchoscope was passed through the endotracheal tube.  The carina was in the midline.  The right upper lobe, right middle lobe, and right lower lobe orifices were normal.  The left mainstem, left upper lobe, and left lower lobe orifices were normal.  This patient had irregular lesions superior segment of left lower lobe.  It was through the video scope was passed the locatable guide with the extended working channel and we did automatic registration starting in the trachea, then going the carina and then going to all branches on the right side and all branches on the left side and automatic registration was done.  We then started the navigation. Irrigation down to the superior segment and there were 3 branches in the superior segment and the inferior one at the right choice.  We then navigated out to be in 1 to 1.5 cm from the mass.  We then pulled locatable guide and passed the needle brush twice and then sent that for frozen for on-site cytology.  After that had been done, we then used the biopsy forceps and 2 multiple biopsies of the area.  We put in the locatable guide back in to be sure we twice more to be sure overall within 1 to 1.5 cm of lesion.  Finally we passed the needle brush out through this extended working channel and 2 brushings with that  for permanent section.  After this had been completed, BAL was done with 30 mL saline, scoring in the saline and then withdrawn back in resection and sending that for cytology and culture.  Initial report was adenocarcinoma of the rapid cytology.  The extended working channel was then removed and the bronchoscope was removed and the patient was awakened and returned to the recovery room.     Ines Bloomer, M.D.     DPB/MEDQ  D:  11/27/2010  T:  11/28/2010  Job:  [id number]  Electronically Signed by Jovita Gamma M.D. on 11/29/2010 09:15:30 AM

## 2010-12-07 DIAGNOSIS — C3490 Malignant neoplasm of unspecified part of unspecified bronchus or lung: Secondary | ICD-10-CM | POA: Insufficient documentation

## 2010-12-07 HISTORY — PX: LUNG CANCER SURGERY: SHX702

## 2010-12-07 HISTORY — DX: Malignant neoplasm of unspecified part of unspecified bronchus or lung: C34.90

## 2010-12-19 ENCOUNTER — Encounter (HOSPITAL_COMMUNITY)
Admission: RE | Admit: 2010-12-19 | Discharge: 2010-12-19 | Disposition: A | Discharge status: Home / Self Care | Payer: Medicare Other | Source: Ambulatory Visit | Attending: Thoracic Surgery | Admitting: Thoracic Surgery

## 2010-12-19 ENCOUNTER — Ambulatory Visit (HOSPITAL_COMMUNITY)
Admission: RE | Admit: 2010-12-19 | Discharge: 2010-12-19 | Disposition: A | Discharge status: Home / Self Care | Payer: Medicare Other | Source: Ambulatory Visit | Attending: Thoracic Surgery | Admitting: Thoracic Surgery

## 2010-12-19 ENCOUNTER — Other Ambulatory Visit: Payer: Self-pay | Admitting: Thoracic Surgery

## 2010-12-19 DIAGNOSIS — Z01812 Encounter for preprocedural laboratory examination: Secondary | ICD-10-CM | POA: Insufficient documentation

## 2010-12-19 DIAGNOSIS — Z01818 Encounter for other preprocedural examination: Secondary | ICD-10-CM | POA: Insufficient documentation

## 2010-12-19 DIAGNOSIS — R911 Solitary pulmonary nodule: Secondary | ICD-10-CM

## 2010-12-19 DIAGNOSIS — Z01810 Encounter for preprocedural cardiovascular examination: Secondary | ICD-10-CM | POA: Insufficient documentation

## 2010-12-19 LAB — BLOOD GAS, ARTERIAL
Acid-Base Excess: 4.1 mmol/L — ABNORMAL HIGH (ref 0.0–2.0)
Bicarbonate: 28.4 mEq/L — ABNORMAL HIGH (ref 20.0–24.0)
Drawn by: 206361
FIO2: 0.21 %
O2 Saturation: 93.6 %
Patient temperature: 98.6
TCO2: 29.8 mmol/L (ref 0–100)
pCO2 arterial: 45.1 mmHg — ABNORMAL HIGH (ref 35.0–45.0)
pH, Arterial: 7.416 — ABNORMAL HIGH (ref 7.350–7.400)
pO2, Arterial: 65.6 mmHg — ABNORMAL LOW (ref 80.0–100.0)

## 2010-12-19 LAB — URINALYSIS, ROUTINE W REFLEX MICROSCOPIC
Bilirubin Urine: NEGATIVE
Glucose, UA: NEGATIVE mg/dL
Hgb urine dipstick: NEGATIVE
Ketones, ur: NEGATIVE mg/dL
Nitrite: NEGATIVE
Protein, ur: NEGATIVE mg/dL
Specific Gravity, Urine: 1.015 (ref 1.005–1.030)
Urobilinogen, UA: 0.2 mg/dL (ref 0.0–1.0)
pH: 6 (ref 5.0–8.0)

## 2010-12-19 LAB — COMPREHENSIVE METABOLIC PANEL
ALT: 14 U/L (ref 0–35)
AST: 18 U/L (ref 0–37)
Albumin: 4 g/dL (ref 3.5–5.2)
Alkaline Phosphatase: 67 U/L (ref 39–117)
BUN: 7 mg/dL (ref 6–23)
CO2: 31 mEq/L (ref 19–32)
Calcium: 9.4 mg/dL (ref 8.4–10.5)
Chloride: 99 mEq/L (ref 96–112)
Creatinine, Ser: 0.81 mg/dL (ref 0.4–1.2)
GFR calc Af Amer: >60 mL/min (ref >60)
GFR calc non Af Amer: >60 mL/min (ref >60)
Glucose, Bld: 103 mg/dL — ABNORMAL HIGH (ref 70–99)
Potassium: 4 mEq/L (ref 3.5–5.1)
Sodium: 137 mEq/L (ref 135–145)
Total Bilirubin: 0.2 mg/dL — ABNORMAL LOW (ref 0.3–1.2)
Total Protein: 6.5 g/dL (ref 6.0–8.3)

## 2010-12-19 LAB — CBC
HCT: 41.1 % (ref 36.0–46.0)
Hemoglobin: 13.8 g/dL (ref 12.0–15.0)
MCH: 29.7 pg (ref 26.0–34.0)
MCHC: 33.6 g/dL (ref 30.0–36.0)
MCV: 88.4 fL (ref 78.0–100.0)
Platelets: 327 10*3/uL (ref 150–400)
RBC: 4.65 MIL/uL (ref 3.87–5.11)
RDW: 13.3 % (ref 11.5–15.5)
WBC: 8.5 10*3/uL (ref 4.0–10.5)

## 2010-12-19 LAB — APTT: aPTT: 33 seconds (ref 24–37)

## 2010-12-19 LAB — PROTIME-INR
INR: 0.95 (ref 0.00–1.49)
Prothrombin Time: 12.9 seconds (ref 11.6–15.2)

## 2010-12-19 LAB — ABO/RH: ABO/RH(D): O NEG

## 2010-12-21 ENCOUNTER — Other Ambulatory Visit: Payer: Self-pay | Admitting: Thoracic Surgery

## 2010-12-21 ENCOUNTER — Inpatient Hospital Stay (HOSPITAL_COMMUNITY)
Admission: RE | Admit: 2010-12-21 | Discharge: 2010-12-26 | DRG: 164 | Disposition: A | Discharge status: Home / Self Care | Payer: Medicare Other | Source: Ambulatory Visit | Attending: Thoracic Surgery | Admitting: Thoracic Surgery

## 2010-12-21 ENCOUNTER — Inpatient Hospital Stay (HOSPITAL_COMMUNITY): Payer: Medicare Other

## 2010-12-21 DIAGNOSIS — G500 Trigeminal neuralgia: Secondary | ICD-10-CM | POA: Diagnosis present

## 2010-12-21 DIAGNOSIS — C3430 Malignant neoplasm of lower lobe, unspecified bronchus or lung: Secondary | ICD-10-CM

## 2010-12-21 DIAGNOSIS — J4489 Other specified chronic obstructive pulmonary disease: Secondary | ICD-10-CM | POA: Diagnosis present

## 2010-12-21 DIAGNOSIS — F17200 Nicotine dependence, unspecified, uncomplicated: Secondary | ICD-10-CM | POA: Diagnosis present

## 2010-12-21 DIAGNOSIS — K219 Gastro-esophageal reflux disease without esophagitis: Secondary | ICD-10-CM | POA: Diagnosis present

## 2010-12-21 DIAGNOSIS — K5732 Diverticulitis of large intestine without perforation or abscess without bleeding: Secondary | ICD-10-CM | POA: Diagnosis present

## 2010-12-21 DIAGNOSIS — E039 Hypothyroidism, unspecified: Secondary | ICD-10-CM | POA: Diagnosis present

## 2010-12-21 DIAGNOSIS — J449 Chronic obstructive pulmonary disease, unspecified: Secondary | ICD-10-CM | POA: Diagnosis present

## 2010-12-21 DIAGNOSIS — E559 Vitamin D deficiency, unspecified: Secondary | ICD-10-CM | POA: Diagnosis present

## 2010-12-21 LAB — MRSA PCR SCREENING: MRSA by PCR: NEGATIVE

## 2010-12-22 ENCOUNTER — Inpatient Hospital Stay (HOSPITAL_COMMUNITY): Payer: Medicare Other

## 2010-12-22 LAB — POCT I-STAT 3, ART BLOOD GAS (G3+)
Acid-Base Excess: 3 mmol/L — ABNORMAL HIGH (ref 0.0–2.0)
Bicarbonate: 29.7 mEq/L — ABNORMAL HIGH (ref 20.0–24.0)
O2 Saturation: 96 %
Patient temperature: 98.3
TCO2: 31 mmol/L (ref 0–100)
pCO2 arterial: 54.4 mmHg — ABNORMAL HIGH (ref 35.0–45.0)
pH, Arterial: 7.344 — ABNORMAL LOW (ref 7.350–7.400)
pO2, Arterial: 91 mmHg (ref 80.0–100.0)

## 2010-12-22 LAB — BASIC METABOLIC PANEL
BUN: 6 mg/dL (ref 6–23)
CO2: 28 mEq/L (ref 19–32)
Calcium: 8 mg/dL — ABNORMAL LOW (ref 8.4–10.5)
Chloride: 100 mEq/L (ref 96–112)
Creatinine, Ser: 0.5 mg/dL (ref 0.4–1.2)
GFR calc Af Amer: >60 mL/min (ref >60)
GFR calc non Af Amer: >60 mL/min (ref >60)
Glucose, Bld: 138 mg/dL — ABNORMAL HIGH (ref 70–99)
Potassium: 4.1 mEq/L (ref 3.5–5.1)
Sodium: 133 mEq/L — ABNORMAL LOW (ref 135–145)

## 2010-12-22 LAB — CBC
HCT: 34.6 % — ABNORMAL LOW (ref 36.0–46.0)
Hemoglobin: 11.4 g/dL — ABNORMAL LOW (ref 12.0–15.0)
MCH: 29.5 pg (ref 26.0–34.0)
MCHC: 32.9 g/dL (ref 30.0–36.0)
MCV: 89.4 fL (ref 78.0–100.0)
Platelets: 258 10*3/uL (ref 150–400)
RBC: 3.87 MIL/uL (ref 3.87–5.11)
RDW: 13.5 % (ref 11.5–15.5)
WBC: 12.6 10*3/uL — ABNORMAL HIGH (ref 4.0–10.5)

## 2010-12-23 ENCOUNTER — Inpatient Hospital Stay (HOSPITAL_COMMUNITY): Payer: Medicare Other

## 2010-12-23 LAB — COMPREHENSIVE METABOLIC PANEL
ALT: 15 U/L (ref 0–35)
AST: 22 U/L (ref 0–37)
Albumin: 2.7 g/dL — ABNORMAL LOW (ref 3.5–5.2)
Alkaline Phosphatase: 50 U/L (ref 39–117)
BUN: <1 mg/dL — ABNORMAL LOW (ref 6–23)
CO2: 32 mEq/L (ref 19–32)
Calcium: 8.3 mg/dL — ABNORMAL LOW (ref 8.4–10.5)
Chloride: 98 mEq/L (ref 96–112)
Creatinine, Ser: 0.47 mg/dL (ref 0.4–1.2)
GFR calc Af Amer: >60 mL/min (ref >60)
GFR calc non Af Amer: >60 mL/min (ref >60)
Glucose, Bld: 145 mg/dL — ABNORMAL HIGH (ref 70–99)
Potassium: 3.9 mEq/L (ref 3.5–5.1)
Sodium: 133 mEq/L — ABNORMAL LOW (ref 135–145)
Total Bilirubin: 0.4 mg/dL (ref 0.3–1.2)
Total Protein: 5.2 g/dL — ABNORMAL LOW (ref 6.0–8.3)

## 2010-12-23 LAB — CBC
HCT: 32.8 % — ABNORMAL LOW (ref 36.0–46.0)
Hemoglobin: 10.8 g/dL — ABNORMAL LOW (ref 12.0–15.0)
MCH: 29.6 pg (ref 26.0–34.0)
MCHC: 32.9 g/dL (ref 30.0–36.0)
MCV: 89.9 fL (ref 78.0–100.0)
Platelets: 227 10*3/uL (ref 150–400)
RBC: 3.65 MIL/uL — ABNORMAL LOW (ref 3.87–5.11)
RDW: 13.5 % (ref 11.5–15.5)
WBC: 10 10*3/uL (ref 4.0–10.5)

## 2010-12-23 LAB — TYPE AND SCREEN
ABO/RH(D): O NEG
Antibody Screen: NEGATIVE
Unit division: 0
Unit division: 0

## 2010-12-23 NOTE — H&P (Signed)
[name], [name]                 ACCOUNT NO.:  [id number]  MEDICAL RECORD NO.:  [id number]           PATIENT TYPE:  I  LOCATION:  [place]                         FACILITY:  [facility]  PHYSICIAN:  Ines Bloomer, M.D. DATE OF BIRTH:  1941-04-06  DATE OF ADMISSION: DATE OF DISCHARGE:                             HISTORY & PHYSICAL   CHIEF COMPLAINT:  Left lower lobe mass.  HISTORY OF PRESENT ILLNESS:  This 70 year old smoker has a history of cough and chest pain and found to have left-sided pleurisy.  Chest x-ray which was normal but she had continued pain.  A CT scan was found to rule out pulmonary embolus which showed a 2-cm spiculated mass on the left lower lobe in the superior segment.  She has no hemoptysis, fever, chills, or sputum.  She underwent electromagnetic navigation bronchoscopy which revealed cancer and a PET scan showed no evidence of any spread.  She has a significant history of trigeminal neuralgia on the left side and hypothyroidism and vitamin D deficiency.  She also history of tobacco abuse and diverticulitis.  MEDICATION:  Levothyroxine for hypothyroidism, Celexa, albuterol, and Fosamax.  ALLERGIES:  She is allergic to SULFA and IVP DYES.  PAST SURGICAL HISTORY:  She had previous hysterectomy, cholecystectomy, and a Bartholin gland cyst.  FAMILY HISTORY:  Noncontributory.  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  She smokes half a pack of cigarettes a day and has tried to stop.  She does not drink alcohol on a regular basis.  REVIEW OF SYSTEMS:  She is 5 feet and half and her weight has been stable.  Cardiac:  No angina or atrial fibrillation.  Pulmonary:  See history of present illness.  No hemoptysis.  GI:  No nausea, vomiting, constipation, or diarrhea.  GU:  No kidney disease, dysuria, or frequent urination.  Vascular:  No claudication, DVT, TIAs.  Neurologic:  No dizziness, headaches, blackouts, or seizures.   Musculoskeletal: Arthritis.  PHYSICAL EXAMINATION:  GENERAL:  She is a thin Caucasian female in no acute distress. VITAL SIGNS:  Her blood pressure is 115/68, pulse 76, respirations 20, sats were 92%. HEENT:  Head is atraumatic.  Eyes, pupils equal and react to light and accommodation.  Extraocular movements are normal.  Ears, tympanic membranes intact.  Nose, there is no septal deviation.  Throat without lesion. NECK:  Supple without thyromegaly. CHEST:  There is some bilateral wheezes.  Increased AP diameter and distal breath sounds. HEART:  Regular sinus rhythm.  No murmur. ABDOMEN:  Soft.  There is no hepatosplenomegaly. EXTREMITIES:  Pulses 2+.  There is no clubbing or edema. NEUROLOGIC:  She is oriented x3.  Sensory and motor intact.  Cranial nerves intact.  IMPRESSION: 1. Adenocarcinoma, left lower lobe superior segment. 2. Chronic obstructive pulmonary disease.  She has a FVC of 2.32 and     FEV-1 of 1.05, and a diffusion capacity of 40%, which corrects to     42%. 3. Hypothyroidism. 4. Vitamin D deficiency. 5. Trigeminal neuralgia, left. 6. Diverticulitis.  PLAN:  Left lower lobe superior segmentectomy.     Dorinda Hill  Delrae Alfred, M.D.     DPB/MEDQ  D:  12/18/2010  T:  12/19/2010  Job:  [id number]  Electronically Signed by Jovita Gamma M.D. on 12/23/2010 10:19:57 AM

## 2010-12-23 NOTE — Op Note (Signed)
[name], [name]                 ACCOUNT NO.:  [id number]  MEDICAL RECORD NO.:  [id number]           PATIENT TYPE:  I  LOCATION:  2312                         FACILITY:  [facility]  PHYSICIAN:  Ines Bloomer, M.D. 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  OPERATION PERFORMED:  Left VATS, minithoracotomy.  Left lower lobe superior segmentectomy.  SURGEON:  Ines Bloomer, MD.  FIRST ASSISTANT:  Zadie Rhine, PA-C.  ANESTHESIA:  General anesthesia.  This patient underwent electromagnetic navigational biopsy and had a biopsy-proven adenocarcinoma in the superior segment of left lower lobe. Was brought to the operating room for resection.  On pulmonary function test, had a FEV1 of only 1.05 or about 50% of predicted, so we wanted to do a conservative procedure.  PROCEDURE:  After general anesthesia, patient was turned in left lateral thoracotomy position, was prepped and draped in usual sterile manner. Two trocar sites made in the anterior and posterior axillary line.  At the seventh intercostal space, two trocars were inserted and the lung was deflated.  We made a third incision of the triangle of auscultation, partially dividing the latissimus and reflecting the serratus anterior and entered in the fifth intercostal space.  A small tube was placed in the interspace, and the lesion was palpated in the superior segment. Dissection was started in the segment.  We dissected down to the pulmonary artery and multiple inflammatory nodes.  We dissected out several 11L nodes.  We then divided the fissure with a Covidien in order to use it as [employer].   After this had been done, we then dissected out several more 10L and 11Ll nodes and then dissected into the AP window, getting some five nodes.  Attention was then turned to the two superior tunneled branches, and both were ligated proximally with zero silk, clipped and divided.  Exposure then made that was around the superior segmental bronchus, and this was dissected out as a 11L node.  The bronchus was stable with a Ethicon 80B35 stapler.  Finally, the superior pulmonary vein was doubly clipped and divided.  We then came across the fissure line with a Covidien 60 stapler with Seamguard reinforcements with two applications.  We checked for an air leak and did not have any major air leak, but applied CoSeal to the staple line. A #24 chest tube was brought in anteriorly and sutured in place with 0 silk and a 28 chest tube posteriorly and sutured in place with 0 silk. A Marcaine block with intercostal nerve block was done in the usual fashion.  Single On-Q inserted in the usual fashion.  We then drilled through the sixth rib and passed the sixth riband passed pericostals through the sixth rib and around the fifth rib; #1 Vicryl in muscle layer, 2-0 Vicryl in subcutaneous tissue, and Dermabond for the skin.  The patient  was transferred to recovery room in stable condition.     Ines Bloomer, M.D.     DPB/MEDQ  D:  12/21/2010  T:  12/22/2010  Job:  [id number]  Electronically Signed by Jovita Gamma M.D. on 12/23/2010 10:20:03 AM

## 2010-12-24 ENCOUNTER — Inpatient Hospital Stay (HOSPITAL_COMMUNITY): Payer: Medicare Other

## 2010-12-25 ENCOUNTER — Inpatient Hospital Stay (HOSPITAL_COMMUNITY): Payer: Medicare Other

## 2010-12-25 LAB — BASIC METABOLIC PANEL
BUN: 5 mg/dL — ABNORMAL LOW (ref 6–23)
CO2: 33 mEq/L — ABNORMAL HIGH (ref 19–32)
Calcium: 8.6 mg/dL (ref 8.4–10.5)
Chloride: 96 mEq/L (ref 96–112)
Creatinine, Ser: 0.43 mg/dL (ref 0.4–1.2)
GFR calc Af Amer: >60 mL/min (ref >60)
GFR calc non Af Amer: >60 mL/min (ref >60)
Glucose, Bld: 99 mg/dL (ref 70–99)
Potassium: 3.8 mEq/L (ref 3.5–5.1)
Sodium: 134 mEq/L — ABNORMAL LOW (ref 135–145)

## 2010-12-25 LAB — CBC
HCT: 32.3 % — ABNORMAL LOW (ref 36.0–46.0)
Hemoglobin: 10.7 g/dL — ABNORMAL LOW (ref 12.0–15.0)
MCH: 29.6 pg (ref 26.0–34.0)
MCHC: 33.1 g/dL (ref 30.0–36.0)
MCV: 89.2 fL (ref 78.0–100.0)
Platelets: 277 10*3/uL (ref 150–400)
RBC: 3.62 MIL/uL — ABNORMAL LOW (ref 3.87–5.11)
RDW: 13.4 % (ref 11.5–15.5)
WBC: 7.2 10*3/uL (ref 4.0–10.5)

## 2010-12-26 ENCOUNTER — Inpatient Hospital Stay (HOSPITAL_COMMUNITY): Payer: Medicare Other

## 2010-12-26 LAB — FUNGUS CULTURE W SMEAR: Fungal Smear: NONE SEEN

## 2010-12-31 NOTE — Discharge Summary (Signed)
[name], [name]                 ACCOUNT NO.:  [id number]  MEDICAL RECORD NO.:  [id number]           PATIENT TYPE:  LOCATION:                                 FACILITY:  PHYSICIAN:  Ines Bloomer, M.D. DATE OF BIRTH:  04-26-1941  DATE OF ADMISSION: DATE OF DISCHARGE:                              DISCHARGE SUMMARY   FINAL DIAGNOSIS:  Left lower lobe mass, positive for adenocarcinoma with bronchioalveolar features, well differentiated, T1b N0.  SECONDARY DIAGNOSES: 1. Hypothyroidism. 2. History of trigeminal neuralgia on the left side. 3. Vitamin D deficiency. 4. History of tobacco abuse. 5. Diverticulitis. 6. Status post hysterectomy. 7. Status post cholecystectomy. 8. Status post removal of Bartholin gland cyst.  IN-HOSPITAL OPERATIONS AND PROCEDURES:  Left video-assisted thoracoscopic surgery with left thoracotomy, left lower lobe superior segmentectomy with lymph node biopsy.  HISTORY AND PHYSICAL AND HOSPITAL COURSE:  The patient is a 70 year old smoker who has a history of cough and chest pain who was found to have left-sided pleurisy.  Chest x-ray obtained was normal.  The patient continued to have pain.  CT scan done for follow up to rule out pulmonary embolism showed a 2 cm spiculated mass on the left lower lobe in the superior segment.  The patient has no hemoptysis, fever, chills, or sputum.  She underwent electromagnetic navigation bronchoscopy which revealed a cancer.  PET scan done showed no evidence of any spread.  The patient had significant history of trigeminal neuralgia on the left side and hypothyroidism and vitamin D deficiency.  Dr. Edwyna Shell discussed with the patient undergoing left thoracotomy for left lower lobe superior segmentectomy.  He discussed risks and benefits with the patient.  The patient acknowledged her understanding and agreed to proceed.  Surgery was scheduled for December 21, 2010.  For further details of the patient's past medical  history and physical exam, please see dictated H and P.  The patient was taken to the operating room on December 21, 2010, where she underwent left video-assisted thoracoscopic surgery with left thoracotomy, left lower lobe superior segmentectomy with lymph node biopsy.  The patient tolerated this procedure well and was transferred to the intensive care unit in stable condition.  Postoperatively, the patient was able to be extubated following surgery.  Postextubation, she was noted to be alert and oriented x4, neuro intact.  The patient was noted to be hemodynamically stable postoperatively.  The patient's pathology report came back positive for adenocarcinoma with bronchioalveolar features, well differentiated, T1b N0.  During the patient's postoperative course, daily chest x-rays were obtained.  Chest x-rays noted to be stable with no pneumothorax noted.  No air leak noted.  One chest tube was discontinued postop day 2 and remaining chest tube placed to water seal at that time.  Followup chest x-ray remained stable with no pneumothorax.  The patient had no air leak and remaining chest tube was discontinued postop day 3.  The patient's most recent chest x-ray, December 25, 2010, remained stable with no pneumothorax. During this time, the patient was encouraged to use her incentive spirometer.  She is currently on 1 L nasal cannula  and will continue to work to wean off to maintain O2 saturations greater than 88% on room air.  Vital signs continued to be followed postoperatively.  She has remained afebrile in normal sinus rhythm.  Blood pressure stable.  She has been up ambulating well without difficulty.  She is tolerating diet well.  No nausea or vomiting noted.  All incisions are clean, dry, and intact and healing well.  On December 25, 2010, postop day 4, the patient's lab work shows white blood cell count 7.2, hemoglobin 10.7, hematocrit 32.3, platelet count 277,000.  Sodium 134, potassium  3.8, chloride of 96, bicarbonate 33, BUN of 5, creatinine 0.43, glucose of 99.  The patient is tentatively ready for discharge to home in the a.m. pending she remains stable.  FOLLOWUP APPOINTMENTS:  A followup appointment has been arranged with Dr. Edwyna Shell for January 02, 2011, at 2:30 p.m.  The patient will need to obtain PA and lateral chest x-ray 45 minutes prior to this appointment.  ACTIVITY:  The patient instructed no driving until released to do so, no lifting over 10 pounds.  She is told to ambulate 3-4 times per day, progress as tolerated, and continue her breathing exercises.  INCISIONAL CARE:  The patient is told to shower washing her incisions using soap and water.  She is to contact the office if she develops any drainage or opening from any of her incision sites.  DIET:  The patient educated on diet to be low fat, low salt.  DISCHARGE MEDICATIONS: 1. Percocet 5/325 one to two tablets q.4 p.r.n. pain. 2. Citalopram 40 mg, take half a tablet in the evening. 3. Calcium and vitamin D daily. 4. Levoxyl 25 mcg daily. 5. Mucinex 400 mg half a tablet in the morning and 1 tablet every     evening. 6. Omeprazole 20 mg daily. 7. Ventolin inhaler 90 mcg 2 puffs q.4 h. p.r.n. 8. Vitamin D3 5000 units b.i.d. 9. Zyrtec over-the-counter 1 tablet daily.     Sol Blazing, PA   ______________________________ Ines Bloomer, M.D.    KMD/MEDQ  D:  12/25/2010  T:  12/26/2010  Job:  [id number]  Electronically Signed by Cameron Proud PA on 12/27/2010 01:36:22 PM Electronically Signed by Jovita Gamma M.D. on 12/31/2010 09:51:38 AM

## 2011-01-01 ENCOUNTER — Other Ambulatory Visit: Payer: Self-pay | Admitting: Thoracic Surgery

## 2011-01-01 DIAGNOSIS — C3430 Malignant neoplasm of lower lobe, unspecified bronchus or lung: Secondary | ICD-10-CM

## 2011-01-02 ENCOUNTER — Ambulatory Visit (INDEPENDENT_AMBULATORY_CARE_PROVIDER_SITE_OTHER): Payer: Self-pay | Admitting: Thoracic Surgery

## 2011-01-02 ENCOUNTER — Ambulatory Visit
Admission: RE | Admit: 2011-01-02 | Discharge: 2011-01-02 | Disposition: A | Discharge status: Home / Self Care | Payer: Medicare Other | Source: Ambulatory Visit | Attending: Thoracic Surgery | Admitting: Thoracic Surgery

## 2011-01-02 DIAGNOSIS — C3490 Malignant neoplasm of unspecified part of unspecified bronchus or lung: Secondary | ICD-10-CM

## 2011-01-02 DIAGNOSIS — C3430 Malignant neoplasm of lower lobe, unspecified bronchus or lung: Secondary | ICD-10-CM

## 2011-01-03 NOTE — Assessment & Plan Note (Signed)
OFFICE VISIT  Ruth, Sanford DOB:  09/19/1941                                        January 02, 2011 CHART #:  [id number]  HISTORY:  The patient is a 69 year old female who is a former smoker, having quit recently, who was diagnosed with a left lower lobe lung mass and admitted for resection on December 21, 2010.  At that time, she underwent a left video-assisted thoracoscopic surgery with minithoracotomy and left lower lobe superior segmentectomy done by Dr. Edwyna Shell.  Pathology revealed this to be an adenocarcinoma with bronchial alveolar features, well differentiated, spanning 2.2 cm.  The surgical resection margins appeared negative for carcinoma and lymph nodes at multiple levels were all negative for carcinoma.  The patient was staged as a T1 lesion.  Currently, she does have some moderate pain at times. This is mostly in the region of the incision but there is some anterior left-sided chest discomfort as well at times.  She has some mild shortness of breath with exertion.  She is returning to her activities slowly but steadily.  She has had no fevers, chills, or other constitutional symptoms.  Chest x-ray was obtained and shows a left-sided effusion which is moderate in size but appeared stable.  She has no evidence of congestive failure or new acute findings.  PHYSICAL EXAMINATION:  VITAL SIGNS:  Blood pressure 113/73, pulse 84, respirations 18, oxygen saturation is 90-92% on room air. GENERAL:  Well-developed adult female, in no acute distress. CHEST:  Incisions were inspected.  They are healing well without evidence of infection.  Chest tube sutures were removed by myself on today's date.  They appeared to be healing well.  Pulmonary examination reveals diminished breath sounds in the left base, otherwise clear.  No wheezes, no rhonchi. CARDIAC:  Regular rate and rhythm.  Normal S1 and S2.  ASSESSMENT:  [name] is stable and making steady  progress.  We will refer her to Dr. Mariel Sanford for Oncology opinion but at this time it appears as though she will probably not require any adjuvant therapy. Dr. Edwyna Shell will see her again in 2 weeks with a BP, chest x-ray to follow up on her surgical recovery.  She can start driving next week which we informed her and she has a continued lifting restriction of less than 20 pounds.  [name], P.A.-C.  [name]  D:  01/02/2011  T:  01/03/2011  Job:  [id number]  cc:   [name]. Ruth Sleet, MD

## 2011-01-09 LAB — AFB CULTURE WITH SMEAR (NOT AT ARMC): Acid Fast Smear: NONE SEEN

## 2011-01-16 ENCOUNTER — Other Ambulatory Visit (HOSPITAL_COMMUNITY): Payer: Self-pay | Admitting: Oncology

## 2011-01-16 ENCOUNTER — Encounter (HOSPITAL_COMMUNITY): Payer: Medicare Other | Attending: Oncology | Admitting: Oncology

## 2011-01-16 ENCOUNTER — Other Ambulatory Visit: Payer: Self-pay | Admitting: Thoracic Surgery

## 2011-01-16 ENCOUNTER — Ambulatory Visit: Payer: Medicare Other | Admitting: Thoracic Surgery

## 2011-01-16 DIAGNOSIS — C3430 Malignant neoplasm of lower lobe, unspecified bronchus or lung: Secondary | ICD-10-CM

## 2011-01-16 DIAGNOSIS — C3490 Malignant neoplasm of unspecified part of unspecified bronchus or lung: Secondary | ICD-10-CM

## 2011-01-17 ENCOUNTER — Ambulatory Visit
Admission: RE | Admit: 2011-01-17 | Discharge: 2011-01-17 | Disposition: A | Discharge status: Home / Self Care | Payer: Medicare Other | Source: Ambulatory Visit | Attending: Thoracic Surgery | Admitting: Thoracic Surgery

## 2011-01-17 ENCOUNTER — Ambulatory Visit (INDEPENDENT_AMBULATORY_CARE_PROVIDER_SITE_OTHER): Payer: Self-pay | Admitting: Thoracic Surgery

## 2011-01-17 DIAGNOSIS — C3430 Malignant neoplasm of lower lobe, unspecified bronchus or lung: Secondary | ICD-10-CM

## 2011-01-17 DIAGNOSIS — C3490 Malignant neoplasm of unspecified part of unspecified bronchus or lung: Secondary | ICD-10-CM

## 2011-01-18 NOTE — Assessment & Plan Note (Signed)
OFFICE VISIT  ADELHEID, [name] DOB:  1941-08-14                                        January 17, 2011 CHART #:  [id number]  HISTORY:  The patient is a 70 year old female smoker who underwent left video-assisted thoracoscopic surgery with mini thoracotomy and left lower lobe superior segmentectomy by Dr. Edwyna Shell on December 21, 2010. Pathology revealed this to be adenocarcinoma with bronchioalveolar features, well differentiated, spanning 2.2 cm.  The surgical resection margins were negative for carcinoma, and there was no evidence of carcinoma and the multiple lymph node levels stations taken.  She is a T1 code.  She has been seen by Dr. Mariel Sleet and his current plan per the patient is to get a repeat CT scan in approximately 6 months. Currently, she overall feels fairly well.  Her breathing is pretty comfortable, but she does have some significant amount of hoarseness that does bother her some.  It is not improving.  It is fairly stable throughout the day and has no significant changes that she can relate.  Chest x-ray was obtained on today's date.  It shows some surgical changes in the left lower lung fields, but no acute infiltrates or masses.  There may be some pleural effusion in the left base as well.  PHYSICAL EXAMINATION:  VITAL SIGNS:  Blood pressure 110/70, pulse 82, respirations 16, oxygen saturation is 93% on room air. GENERAL APPEARANCE:  Well-developed well-developed female in no acute distress.  She is hoarse when talking. PULMONARY:  Slightly diminished breath sounds in the left base, greater than the right base, otherwise fairly clear. CARDIAC:  Regular rate and rhythm.  Incisions are inspected well-healed without evidence of infection.  ASSESSMENT:  [name] is making continued improvement.  She is still using some pain medication.  Hoarseness is her primary issue, which she feels require dressing, and we will make arrangements for her to  be seen by an ENT physician to rule out the possibility of recurrent nerve injury and an evaluation for hoarseness.  She will be seen by [place] in approximately 4 weeks with a repeat chest x-ray.  Rowe Clack, P.A.-C.  [name]  D:  01/17/2011  T:  01/18/2011  Job:  [id number]  cc:   Ladona Horns. Mariel Sleet, MD

## 2011-01-22 DIAGNOSIS — Z0279 Encounter for issue of other medical certificate: Secondary | ICD-10-CM

## 2011-02-01 ENCOUNTER — Encounter (HOSPITAL_COMMUNITY)
Admission: RE | Admit: 2011-02-01 | Discharge: 2011-02-01 | Disposition: A | Discharge status: Home / Self Care | Payer: Medicare Other | Source: Ambulatory Visit | Attending: Otolaryngology | Admitting: Otolaryngology

## 2011-02-01 ENCOUNTER — Ambulatory Visit (HOSPITAL_COMMUNITY)
Admission: RE | Admit: 2011-02-01 | Discharge: 2011-02-01 | Disposition: A | Discharge status: Home / Self Care | Payer: Medicare Other | Source: Ambulatory Visit | Attending: Otolaryngology | Admitting: Otolaryngology

## 2011-02-01 ENCOUNTER — Ambulatory Visit: Payer: Medicare Other | Admitting: Gastroenterology

## 2011-02-01 ENCOUNTER — Other Ambulatory Visit (HOSPITAL_COMMUNITY): Payer: Self-pay | Admitting: Otolaryngology

## 2011-02-01 DIAGNOSIS — R490 Dysphonia: Secondary | ICD-10-CM

## 2011-02-01 DIAGNOSIS — F17200 Nicotine dependence, unspecified, uncomplicated: Secondary | ICD-10-CM | POA: Insufficient documentation

## 2011-02-01 DIAGNOSIS — J438 Other emphysema: Secondary | ICD-10-CM | POA: Insufficient documentation

## 2011-02-01 DIAGNOSIS — Z01811 Encounter for preprocedural respiratory examination: Secondary | ICD-10-CM | POA: Insufficient documentation

## 2011-02-01 DIAGNOSIS — Z01812 Encounter for preprocedural laboratory examination: Secondary | ICD-10-CM | POA: Insufficient documentation

## 2011-02-01 LAB — CBC
HCT: 39.5 % (ref 36.0–46.0)
Hemoglobin: 13.1 g/dL (ref 12.0–15.0)
MCH: 28.9 pg (ref 26.0–34.0)
MCHC: 33.2 g/dL (ref 30.0–36.0)
MCV: 87.2 fL (ref 78.0–100.0)
Platelets: 377 10*3/uL (ref 150–400)
RBC: 4.53 MIL/uL (ref 3.87–5.11)
RDW: 12.7 % (ref 11.5–15.5)
WBC: 7.1 10*3/uL (ref 4.0–10.5)

## 2011-02-01 LAB — BASIC METABOLIC PANEL
BUN: 4 mg/dL — ABNORMAL LOW (ref 6–23)
CO2: 32 mEq/L (ref 19–32)
Calcium: 10 mg/dL (ref 8.4–10.5)
Chloride: 98 mEq/L (ref 96–112)
Creatinine, Ser: 0.54 mg/dL (ref 0.4–1.2)
GFR calc Af Amer: >60 mL/min (ref >60)
GFR calc non Af Amer: >60 mL/min (ref >60)
Glucose, Bld: 86 mg/dL (ref 70–99)
Potassium: 4.8 mEq/L (ref 3.5–5.1)
Sodium: 135 mEq/L (ref 135–145)

## 2011-02-01 LAB — SURGICAL PCR SCREEN
MRSA, PCR: NEGATIVE
Staphylococcus aureus: NEGATIVE

## 2011-02-05 ENCOUNTER — Observation Stay (HOSPITAL_COMMUNITY)
Admission: RE | Admit: 2011-02-05 | Discharge: 2011-02-06 | Disposition: A | Discharge status: Home / Self Care | Payer: Medicare Other | Source: Ambulatory Visit | Attending: Otolaryngology | Admitting: Otolaryngology

## 2011-02-05 DIAGNOSIS — R3589 Other polyuria: Principal | ICD-10-CM | POA: Insufficient documentation

## 2011-02-05 DIAGNOSIS — J381 Polyp of vocal cord and larynx: Secondary | ICD-10-CM | POA: Insufficient documentation

## 2011-02-05 DIAGNOSIS — J3801 Paralysis of vocal cords and larynx, unilateral: Secondary | ICD-10-CM | POA: Insufficient documentation

## 2011-02-05 DIAGNOSIS — R358 Other polyuria: Secondary | ICD-10-CM | POA: Insufficient documentation

## 2011-02-05 DIAGNOSIS — J438 Other emphysema: Secondary | ICD-10-CM | POA: Insufficient documentation

## 2011-02-05 DIAGNOSIS — K219 Gastro-esophageal reflux disease without esophagitis: Secondary | ICD-10-CM | POA: Insufficient documentation

## 2011-02-05 DIAGNOSIS — Z01818 Encounter for other preprocedural examination: Secondary | ICD-10-CM | POA: Insufficient documentation

## 2011-02-05 DIAGNOSIS — Z01812 Encounter for preprocedural laboratory examination: Secondary | ICD-10-CM | POA: Insufficient documentation

## 2011-02-06 HISTORY — PX: OTHER SURGICAL HISTORY: SHX169

## 2011-02-07 NOTE — Op Note (Signed)
NAMEHYDIE, [name] NO.:  [id number]  MEDICAL RECORD NO.:  [id number]           PATIENT TYPE:  O  LOCATION:  5157                         FACILITY:  [facility]  PHYSICIAN:  Antony Contras, MD     DATE OF BIRTH:  03-08-1941  DATE OF PROCEDURE:  02/05/2011 DATE OF DISCHARGE:                              OPERATIVE REPORT   PREOPERATIVE DIAGNOSES: 1. Hoarseness. 2. Left vocal cord paralysis.  POSTOPERATIVE DIAGNOSES: 1. Hoarseness. 2. Left vocal cord paralysis. 3. Left vocal cord polyp.  PROCEDURE: 1. Suspended micro direct laryngoscopy with bilateral vocal cord     Radiesse injections. 2. Left vocal cord biopsy.  SURGEON:  Antony Contras, MD  ANESTHESIA:  General jet Venturi ventilation anesthesia.  COMPLICATIONS:  None.  INDICATIONS:  The patient is a 70 year old white female who underwent left thoracotomy on December 21, 2010, to remove a tumor in the chest, and has had a breathy dysphonia ever since.  She can get strangled when swallowing as well.  In the office, she was found to have an immobile and thin left vocal cord with a glottic gap with phonation.  Thus, she presents to the operating room for surgical management.  FINDINGS:  The left vocal cord was found to be thin in muscle tone compared to the right.  There was a small polyp on the superior surface of the mid cord as well.  DESCRIPTION OF PROCEDURE:  The patient was identified in the holding room and informed consent having been obtained including discussion of risks, benefits, alternatives, the patient was brought to the operative suite and put on the operating table in supine position.  Anesthesia was induced and the patient was maintained via mask ventilation.  The patient was given intravenous steroids during the case.  The eyes were taped closed and the bed was turned 90 degrees from Anesthesia.  Once under anesthesia, a tooth guard was placed and a Storz laryngoscope was placed in a  supraglottic position and suspended on a Mayo stand using a Lewy arm.  Jet Venturi ventilation was then initiated and maintained through the remainder of the case.  A 0-degree telescope was used to make a preoperative photograph.  The operating microscope was brought into view and epinephrine soaked pledget was placed against the left vocal cord for a minute or so and then removed.  The superior surface polyp was then removed with a cup forceps and passed to Nursing for pathology.  The epinephrine pledget was held against the vocal cord again to help with bleeding.  After this, the Radiesse injection was then used starting on the left side and then adding some to the right side as well.  The left side totaled 0.5 mL and the right side totaled 0.2 mL.  About to thirds of the injection was placed posteriorly and about one third anteriorly in each vocal cord.  This resulted in what appeared to be a good filling of the vocal cords.  Thus, the airway was suctioned and then sprayed with topical lidocaine.  A postoperative photograph was made with a 0-degree telescope.  Laryngoscope was  taken off suspension and removed from the patient's mouth while suctioning the airway.  She was turned back to Anesthesia under mask ventilation and moved to the recovery room in stable condition.     Antony Contras, MD     DDB/MEDQ  D:  02/05/2011  T:  02/06/2011  Job:  [id number]  Electronically Signed by Christia Reading MD on 02/07/2011 06:23:41 PM

## 2011-02-13 ENCOUNTER — Other Ambulatory Visit: Payer: Self-pay | Admitting: Thoracic Surgery

## 2011-02-13 DIAGNOSIS — C3430 Malignant neoplasm of lower lobe, unspecified bronchus or lung: Secondary | ICD-10-CM

## 2011-02-14 ENCOUNTER — Ambulatory Visit
Admission: RE | Admit: 2011-02-14 | Discharge: 2011-02-14 | Disposition: A | Discharge status: Home / Self Care | Payer: Medicare Other | Source: Ambulatory Visit | Attending: Thoracic Surgery | Admitting: Thoracic Surgery

## 2011-02-14 ENCOUNTER — Ambulatory Visit (INDEPENDENT_AMBULATORY_CARE_PROVIDER_SITE_OTHER): Payer: Self-pay | Admitting: Thoracic Surgery

## 2011-02-14 DIAGNOSIS — C3430 Malignant neoplasm of lower lobe, unspecified bronchus or lung: Secondary | ICD-10-CM

## 2011-02-14 DIAGNOSIS — C3490 Malignant neoplasm of unspecified part of unspecified bronchus or lung: Secondary | ICD-10-CM

## 2011-02-15 NOTE — Assessment & Plan Note (Signed)
OFFICE VISIT  Ruth, Sanford DOB:  May 14, 1941                                        Feb 14, 2011 CHART #:  [id number]  HISTORY OF PRESENTING ILLNESS:  This is a 70 year old Caucasian female who is status post left lower lobe superior segmentectomy for adenocarcinoma.  Lymph nodes were negative for carcinoma.  The patient was last seen in the office on January 17, 2011.  Her only complaint was occasional pain and hoarseness.  She has been seen and evaluated by Dr. Charlsie Quest regarding hoarseness.  She was found to have left vocal cord paralysis and a left vocal cord polyp.  She underwent a laryngoscopy with bilateral vocal cord Radiesse injections and a left vocal cord biopsy on February 05, 2011.  The patient states that her hoarseness has improved.  She is able to cough and swallow better.  She denies any fever, chills, chest pain or shortness of breath.  She has recently returned to work as well.  PHYSICAL EXAMINATION:  General:  This is a 70 year old Caucasian female who is in no acute distress, who is alert, oriented, and cooperative. Latest vital signs are as follows:  BP 117/74, heart rate 73, respiration rate 20, O2 saturation 92% on room air.  Cardiovascular: Regular rate and rhythm.  No murmurs, gallops, or rubs.  Pulmonary: Clear to auscultation bilaterally.  No rales, wheezes, or rhonchi.  Left posterior chest wound is clean, dry, and well healed.  Chest x-ray done showed stable postsurgical changes of the left lower lobe, stable emphysema.  No acute or metastatic disease findings.  IMPRESSION AND PLAN:  The patient has no evidence of recurrence of her left lung cancer.  She is going to return to see Dr. Edwyna Shell in the office in 2 months with a chest x-ray.  She is to contact the office in the interim if there are any questions, problems, or concerns.  She also has an appointment to see Dr. Charlsie Quest next week regarding followup from the laryngoscopy,  etc.  Ruth Fudge, PA  DZ/MEDQ  D:  02/14/2011  T:  02/15/2011  Job:  [id number]  cc:   Dr. Twana First. Mariel Sleet, MD

## 2011-02-20 ENCOUNTER — Encounter: Payer: Self-pay | Admitting: Gastroenterology

## 2011-02-21 ENCOUNTER — Encounter: Payer: Self-pay | Admitting: Gastroenterology

## 2011-02-21 ENCOUNTER — Ambulatory Visit (INDEPENDENT_AMBULATORY_CARE_PROVIDER_SITE_OTHER): Payer: Medicare Other | Admitting: Gastroenterology

## 2011-02-21 DIAGNOSIS — R1013 Epigastric pain: Secondary | ICD-10-CM

## 2011-02-21 DIAGNOSIS — R1310 Dysphagia, unspecified: Secondary | ICD-10-CM | POA: Insufficient documentation

## 2011-02-21 DIAGNOSIS — R1319 Other dysphagia: Secondary | ICD-10-CM

## 2011-02-21 DIAGNOSIS — R1314 Dysphagia, pharyngoesophageal phase: Secondary | ICD-10-CM

## 2011-02-21 NOTE — Assessment & Plan Note (Signed)
Esophageal dysphagia with history of prior esophageal web/stricture. Last EGD a little over one year ago. Recommend barium study with pill. Patient in agreement and prefers noninvasive studies if possible. In the meantime she should chew her food thoroughly and be careful when taking Fosamax as well as other pills. Sit upright at least thirty minutes after pills and take with at least one glass of water.

## 2011-02-21 NOTE — Assessment & Plan Note (Signed)
Given h/o PUD and epigastric pain. UGI at time of BPE. Continue omeprazole.

## 2011-02-21 NOTE — Progress Notes (Signed)
Primary Care Physician: [name], MD, MD  Primary Gastroenterologist:    Chief Complaint  Patient presents with  . Gastrophageal Reflux    HPI: [name] is a 70 y.o. female here for further evaluation of dysphagia. Since her last office visit here she was diagnosed with adenocarcinoma of the lung. She underwent left VATS with minithoracotomy, left lower lobe superior segmentectomy. She states she developed vocal cord paralysis related to the surgery. Couple weeks ago she had a laryngoscopy with bilateral vocal cord Radiesse injections. Vocal cords are still swollen and too early to tell if injections are working. She states since her vocal cord paralysis she's had concerns about her swallowing. She was told she would not be able to cough or sneeze. She is concerned that she will get strangled or choke. These type symptoms have been better since her injections a couple weeks ago. She notes in the past couple months she's had increasing difficulty with food getting lodged when she eats. Especially true with breads and meats. Denies any odynophagia. She does have some epigastric pain when she. No nausea or vomiting. Bowel movements are fairly good at this point. No constipation or diarrhea. No melena or rectal bleeding.  Lung cancer followed by Dr. Mariel Sleet. No XRT or chemo needed.    Current Outpatient Prescriptions  Medication Sig Dispense Refill  . citalopram (CELEXA) 20 MG tablet Take 20 mg by mouth daily.        Marland Kitchen levothyroxine (SYNTHROID, LEVOTHROID) 25 MCG tablet Take 25 mcg by mouth daily.        Marland Kitchen omeprazole (PRILOSEC) 20 MG capsule Take 20 mg by mouth daily.        Marland Kitchen alendronate (FOSAMAX) 70 MG tablet Take 70 mg by mouth every 7 (seven) days. Take with a full glass of water on an empty stomach.         Allergies as of 02/21/2011 - Review Complete 02/21/2011  Allergen Reaction Noted  . Iohexol  10/31/2010  . Sulfonamide derivatives      ROS:  General: Negative for  anorexia, weight loss, fever, chills, fatigue, weakness. ENT: Negative for nasal congestion. See HPI. CV: Negative for chest pain, angina, palpitations, dyspnea on exertion, peripheral edema.  Respiratory: Negative for dyspnea at rest, dyspnea on exertion, cough, sputum, wheezing.  GI: See history of present illness. GU:  Negative for dysuria, hematuria, urinary incontinence, urinary frequency, nocturnal urination.  Endo: Negative for unusual weight change.    Physical Examination:   BP 95/60  Pulse 69  Temp(Src) 97.4 F (36.3 C) (Temporal)  Ht 5\' 5"  (1.651 m)  Wt 128 lb 12.8 oz (58.423 kg)  BMI 21.43 kg/m2  General: Well-nourished, well-developed in no acute distress.  Eyes: No icterus. Mouth: Oropharyngeal mucosa moist and pink , no lesions erythema or exudate. Lungs: Clear to auscultation bilaterally.  Heart: Regular rate and rhythm, no murmurs rubs or gallops.  Abdomen: Bowel sounds are normal, nontender, nondistended, no hepatosplenomegaly or masses, no abdominal bruits or hernia , no rebound or guarding.   Extremities: No lower extremity edema.  Neuro: Alert and oriented x 4   Skin: Warm and dry, no jaundice.   Psych: Alert and cooperative, normal mood and affect.

## 2011-02-22 NOTE — Progress Notes (Signed)
Cc to PCP 

## 2011-02-27 ENCOUNTER — Ambulatory Visit (HOSPITAL_COMMUNITY)
Admission: RE | Admit: 2011-02-27 | Discharge: 2011-02-27 | Disposition: A | Discharge status: Home / Self Care | Payer: Medicare Other | Source: Ambulatory Visit | Attending: Gastroenterology | Admitting: Gastroenterology

## 2011-02-27 DIAGNOSIS — K449 Diaphragmatic hernia without obstruction or gangrene: Secondary | ICD-10-CM | POA: Insufficient documentation

## 2011-02-27 DIAGNOSIS — R1013 Epigastric pain: Secondary | ICD-10-CM

## 2011-02-27 DIAGNOSIS — R0789 Other chest pain: Secondary | ICD-10-CM | POA: Insufficient documentation

## 2011-03-13 ENCOUNTER — Encounter: Payer: Self-pay | Admitting: Gastroenterology

## 2011-03-14 ENCOUNTER — Telehealth: Payer: Self-pay

## 2011-03-14 NOTE — Telephone Encounter (Signed)
Pt called and said she would like to know what to do. She had UGI and BPE  And they were OK. Pt says she feels a choking sensation/discomfort when she tries to eat. Just cannot hardly eat anything. She says she is taking the Omeprazole once a day. Please advise!

## 2011-03-15 ENCOUNTER — Other Ambulatory Visit: Payer: Self-pay

## 2011-03-15 ENCOUNTER — Telehealth: Payer: Self-pay | Admitting: Gastroenterology

## 2011-03-15 DIAGNOSIS — Z539 Procedure and treatment not carried out, unspecified reason: Secondary | ICD-10-CM

## 2011-03-15 NOTE — Telephone Encounter (Signed)
Pt informed. Scheduled for 1:00 pm on 03/23/2011 per Dr. Darrick Penna.

## 2011-03-15 NOTE — Telephone Encounter (Signed)
Please call pt. If she is having problems swallowing, schedule for Egd/dil on June 15. She should follow a soft diet. If she continues with swallowing difficulty after dil then she will need a MBS and/or a referral to the ENT doc.

## 2011-03-15 NOTE — Telephone Encounter (Signed)
Pt called to let Dr. Darrick Penna know that she had part of a lung removed earlier this year and her vocal cord was damaged. She is already seeing ENT Dr. Jenne Pane in [place].

## 2011-03-15 NOTE — Telephone Encounter (Signed)
Pt informed. Scheduled with Kim at 1:00 pm on 03/23/2011 per Dr. Darrick Penna

## 2011-03-19 ENCOUNTER — Telehealth: Payer: Self-pay

## 2011-03-19 NOTE — Telephone Encounter (Signed)
Pt called and left message with Darl Pikes that she would not be able to keep appt on Fri for the EGD because of work schedule. Said she will reschedule later.

## 2011-03-21 NOTE — Telephone Encounter (Signed)
Pt cancelled EGD/dil.

## 2011-03-21 NOTE — Progress Notes (Signed)
No order needed. Pt decided not to do EGD now.

## 2011-03-23 ENCOUNTER — Encounter: Payer: Medicare Other | Admitting: Gastroenterology

## 2011-04-16 ENCOUNTER — Ambulatory Visit: Payer: Medicare Other | Admitting: Gastroenterology

## 2011-04-16 ENCOUNTER — Other Ambulatory Visit: Payer: Self-pay | Admitting: Thoracic Surgery

## 2011-04-16 DIAGNOSIS — C3430 Malignant neoplasm of lower lobe, unspecified bronchus or lung: Secondary | ICD-10-CM

## 2011-04-17 ENCOUNTER — Ambulatory Visit (INDEPENDENT_AMBULATORY_CARE_PROVIDER_SITE_OTHER): Payer: Medicare Other | Admitting: Thoracic Surgery

## 2011-04-17 ENCOUNTER — Ambulatory Visit
Admission: RE | Admit: 2011-04-17 | Discharge: 2011-04-17 | Disposition: A | Discharge status: Home / Self Care | Payer: Medicare Other | Source: Ambulatory Visit | Attending: Thoracic Surgery | Admitting: Thoracic Surgery

## 2011-04-17 DIAGNOSIS — C3430 Malignant neoplasm of lower lobe, unspecified bronchus or lung: Secondary | ICD-10-CM

## 2011-04-17 DIAGNOSIS — C3490 Malignant neoplasm of unspecified part of unspecified bronchus or lung: Secondary | ICD-10-CM

## 2011-04-17 NOTE — Assessment & Plan Note (Signed)
OFFICE VISIT  [name], [name] DOB:  08-20-1941                                        April 17, 2011 CHART #:  [id number]  The patient returns today and her chest x-ray looks good.  She has resolution of just normal postoperative changes.  Her blood pressure is 104/62, pulse 72, respirations 16, sats were 94%.  Plan to see her back again in 2 months in October when she gets her CT scan for final check. Her only problem is mild swelling of the ankles, and I told her to see her medical doctor if that got worse.  Her lungs were clear to auscultation and percussion.   Dictation ended at this point.  Ines Bloomer, M.D. Electronically Signed  DPB/MEDQ  D:  04/17/2011  T:  04/17/2011  Job:  [id number]

## 2011-05-11 ENCOUNTER — Encounter: Payer: Self-pay | Admitting: Family Medicine

## 2011-05-11 DIAGNOSIS — G500 Trigeminal neuralgia: Secondary | ICD-10-CM | POA: Insufficient documentation

## 2011-06-22 NOTE — Discharge Summary (Signed)
  [name], [name] NO.:  [id number]  MEDICAL RECORD NO.:  [id number]  LOCATION:  5157                         FACILITY:  [facility]  PHYSICIAN:  Antony Contras, MD     DATE OF BIRTH:  07/11/1941  DATE OF ADMISSION:  02/05/2011 DATE OF DISCHARGE:  02/06/2011                              DISCHARGE SUMMARY   ADMISSION DIAGNOSES:  Hoarseness and left vocal cord paralysis.  DISCHARGE DIAGNOSES:  Hoarseness and left vocal cord paralysis.  PROCEDURE:  Suspended microdirect laryngoscopy with Radiesse vocal cord injections.  HISTORY OF PRESENT ILLNESS:  The patient is a 70 year old female who presented to the operating room for vocal fold Radiesse injections due to left vocal cord paralysis.  She has breathy dysphonia.  HOSPITAL COURSE:  The patient was taken to the operating room for the above procedure.  For details of the procedure, please see the dictated operative note.  After surgery, she was observed overnight in the hospital.  She was kept on continuous pulse ox monitoring.  She was started on a regular diet, but kept on complete voice rest.  She is breathing well after surgery and did well overnight.  On the following day, she felt good with comfortable breathing and a good cough.  She was tolerating oral intake very well and was felt stable for discharge home.  INSTRUCTIONS:  The patient was asked to increase activity slowly.  She had no dietary restrictions.  She was asked not to talk for 2 days and then use gentle voice only.  DISCHARGE MEDICATIONS:  Home medicines.  FOLLOWUP:  She will follow up with Dr. Jenne Pane in 2 weeks.     Antony Contras, MD     DDB/MEDQ  D:  05/02/2011  T:  05/03/2011  Job:  [id number]  Electronically Signed by Christia Reading MD on 06/22/2011 12:31:27 PM

## 2011-07-18 ENCOUNTER — Other Ambulatory Visit (HOSPITAL_COMMUNITY): Payer: Self-pay | Admitting: Oncology

## 2011-07-18 ENCOUNTER — Other Ambulatory Visit (HOSPITAL_COMMUNITY): Payer: Self-pay | Admitting: *Deleted

## 2011-07-18 ENCOUNTER — Encounter (HOSPITAL_COMMUNITY): Payer: Medicare Other | Attending: Oncology

## 2011-07-18 DIAGNOSIS — C3490 Malignant neoplasm of unspecified part of unspecified bronchus or lung: Secondary | ICD-10-CM | POA: Insufficient documentation

## 2011-07-18 DIAGNOSIS — C3430 Malignant neoplasm of lower lobe, unspecified bronchus or lung: Secondary | ICD-10-CM

## 2011-07-18 LAB — CBC
HCT: 39.7 % (ref 36.0–46.0)
Hemoglobin: 13.1 g/dL (ref 12.0–15.0)
MCH: 28.9 pg (ref 26.0–34.0)
MCHC: 33 g/dL (ref 30.0–36.0)
MCV: 87.4 fL (ref 78.0–100.0)
Platelets: 353 10*3/uL (ref 150–400)
RBC: 4.54 MIL/uL (ref 3.87–5.11)
RDW: 13.3 % (ref 11.5–15.5)
WBC: 7.6 10*3/uL (ref 4.0–10.5)

## 2011-07-18 LAB — DIFFERENTIAL
Basophils Absolute: 0 10*3/uL (ref 0.0–0.1)
Basophils Relative: 0 % (ref 0–1)
Eosinophils Absolute: 0.1 10*3/uL (ref 0.0–0.7)
Eosinophils Relative: 1 % (ref 0–5)
Lymphocytes Relative: 21 % (ref 12–46)
Lymphs Abs: 1.6 10*3/uL (ref 0.7–4.0)
Monocytes Absolute: 0.5 10*3/uL (ref 0.1–1.0)
Monocytes Relative: 7 % (ref 3–12)
Neutro Abs: 5.3 10*3/uL (ref 1.7–7.7)
Neutrophils Relative %: 70 % (ref 43–77)

## 2011-07-18 LAB — COMPREHENSIVE METABOLIC PANEL
ALT: 10 U/L (ref 0–35)
AST: 17 U/L (ref 0–37)
Albumin: 3.9 g/dL (ref 3.5–5.2)
Alkaline Phosphatase: 69 U/L (ref 39–117)
BUN: 11 mg/dL (ref 6–23)
CO2: 30 mEq/L (ref 19–32)
Calcium: 9.4 mg/dL (ref 8.4–10.5)
Chloride: 99 mEq/L (ref 96–112)
Creatinine, Ser: 0.52 mg/dL (ref 0.50–1.10)
GFR calc Af Amer: >90 mL/min (ref >90)
GFR calc non Af Amer: >90 mL/min (ref >90)
Glucose, Bld: 94 mg/dL (ref 70–99)
Potassium: 4.1 mEq/L (ref 3.5–5.1)
Sodium: 135 mEq/L (ref 135–145)
Total Bilirubin: 0.3 mg/dL (ref 0.3–1.2)
Total Protein: 6.7 g/dL (ref 6.0–8.3)

## 2011-07-18 NOTE — Progress Notes (Signed)
Labs drawn today for cbc,diff,cmp 

## 2011-07-19 ENCOUNTER — Ambulatory Visit (HOSPITAL_COMMUNITY)
Admission: RE | Admit: 2011-07-19 | Discharge: 2011-07-19 | Disposition: A | Discharge status: Home / Self Care | Payer: Medicare Other | Source: Ambulatory Visit | Attending: Oncology | Admitting: Oncology

## 2011-07-19 ENCOUNTER — Encounter (HOSPITAL_COMMUNITY): Payer: Self-pay

## 2011-07-19 ENCOUNTER — Other Ambulatory Visit (HOSPITAL_COMMUNITY): Payer: Medicare Other

## 2011-07-19 ENCOUNTER — Ambulatory Visit (HOSPITAL_COMMUNITY): Admission: RE | Admit: 2011-07-19 | Payer: Medicare Other | Source: Ambulatory Visit

## 2011-07-19 DIAGNOSIS — J438 Other emphysema: Secondary | ICD-10-CM | POA: Insufficient documentation

## 2011-07-19 DIAGNOSIS — C3490 Malignant neoplasm of unspecified part of unspecified bronchus or lung: Secondary | ICD-10-CM | POA: Insufficient documentation

## 2011-07-19 DIAGNOSIS — Z902 Acquired absence of lung [part of]: Secondary | ICD-10-CM | POA: Insufficient documentation

## 2011-07-23 ENCOUNTER — Encounter (HOSPITAL_COMMUNITY): Payer: Self-pay

## 2011-07-23 ENCOUNTER — Encounter (HOSPITAL_BASED_OUTPATIENT_CLINIC_OR_DEPARTMENT_OTHER): Payer: Medicare Other

## 2011-07-23 VITALS — BP 113/70 | HR 71 | Temp 97.7°F | Wt 128.0 lb

## 2011-07-23 DIAGNOSIS — C3490 Malignant neoplasm of unspecified part of unspecified bronchus or lung: Secondary | ICD-10-CM

## 2011-07-23 NOTE — Progress Notes (Signed)
This office note has been dictated.

## 2011-07-23 NOTE — Progress Notes (Signed)
CC:   Claude Manges, MD Ines Bloomer, M.D.  IDENTIFYING STATEMENT:  The patient is a 70 year old woman with lung cancer who presents for followup and to discuss results of recent scans.  INTERVAL HISTORY:  The patient reports that since her last followup visit she has had no major health issues.  She is a little stressed at home.  She has had no loss in weight.  She denies shortness of breath, cough, hemoptysis or chest pain.  We reviewed results of a recent CT chest performed on 07/19/2011.  This showed surgical clips involving the superior segment of the left lower lobe without suspicious pulmonary nodules.  Diffuse emphysematous changes were noted.  Mediastinum had stable appearance with no significant pleural effusion evident.  The pleura was mildly thickened medially in the left hemithorax adjacent to the resection site.  The visualized upper abdomen had stable appearance.  No suspicious osseous lesions were identified.  A T8 compression fracture appeared unchanged.  MEDICATIONS:  Reviewed and recorded.  ALLERGIES:  Cipro, contrast dye, sulfonamides, Prozac.  PMH, FAMILY AND SOCIAL HISTORY:  Unchanged.  REVIEW OF SYSTEMS:  Ten-point review of systems negative.  PHYSICAL EXAMINATION:  The patient is well-appearing, well-nourished woman in no distress.  Vitals:  Pulse 71, blood pressure 113/70, temperature 97.7, respirations 20, weight 128.1 pounds.  HEENT:  Head is atraumatic, normocephalic.  Sclerae anicteric.  Mouth moist.  Neck: Supple.  Chest:  Clear.  Well-healed thoracotomy scar.  Abdomen:  Soft, nontender.  Bowel sounds present.  Lymph Nodes:  No palpable cervical, inguinal or axillary adenopathy.  Extremities:  No calf tenderness. Pulses present and symmetrical.  CNS:  Nonfocal.  LABORATORY DATA:  On 07/18/2011, white cell count 7.6, hemoglobin 13.1, hematocrit 39.7, platelets 353.  Sodium 135, potassium 4.1, chloride 99, CO2 of 30, BUN 11, creatinine  0.52, glucose 94.  T bilirubin 0.3, alkaline phosphatase 69, AST 17, ALT 10, calcium 9.4.  IMPRESSION AND PLAN:  Mrs. 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  The tumor was 2.2 cm with no visual space invasion or margin involvement.  There was no LVI invasion.  None of 14 lymph nodes sampled had evidence of malignancy. The patient was not offered adjuvant chemotherapy.  Recent CT scan of the chest indicates no evidence of recurrence.  The patient follows up in 6 months' time with a non contrast CT scan. She has history of chronic obstructive pulmonary disease.  She also has mild depression for which she takes Celexa.  I spent more than half the time reviewing the patient's results and answering questions.    ______________________________ Laurice Record, M.D. LIO/MEDQ  D:  07/23/2011  T:  07/23/2011  Job:  [id number]

## 2011-07-23 NOTE — Patient Instructions (Signed)
[facility]  Discharge Instructions  RECOMMENDATIONS MADE BY THE CONSULTANT AND ANY TEST RESULTS WILL BE SENT TO YOUR REFERRING DOCTOR.   EXAM FINDINGS BY MD TODAY AND SIGNS AND SYMPTOMS TO REPORT TO CLINIC OR PRIMARY MD:  Exam per Dr. Dalene Carrow.   MEDICATIONS PRESCRIBED: n/a  INSTRUCTIONS GIVEN AND DISCUSSED: Other  Return in 6 months.  SPECIAL INSTRUCTIONS/FOLLOW-UP: Xray Studies Needed  Before next visit and labs also.   I acknowledge that I have been informed and understand all the instructions given to me and received a copy. I do not have any more questions at this time, but understand that I may call the Specialty Clinic at [facility] at [phone] during business hours should I have any further questions or need assistance in obtaining follow-up care.    __________________________________________  _____________  __________ Signature of Patient or Authorized Representative            Date                   Time    __________________________________________ Nurse's Signature

## 2011-07-25 ENCOUNTER — Ambulatory Visit (INDEPENDENT_AMBULATORY_CARE_PROVIDER_SITE_OTHER): Payer: Medicare Other | Admitting: Thoracic Surgery

## 2011-07-25 ENCOUNTER — Encounter: Payer: Self-pay | Admitting: Thoracic Surgery

## 2011-07-25 VITALS — BP 106/65 | HR 74 | Resp 18 | Ht 65.5 in | Wt 128.6 lb

## 2011-07-25 DIAGNOSIS — C3430 Malignant neoplasm of lower lobe, unspecified bronchus or lung: Secondary | ICD-10-CM

## 2011-07-25 NOTE — Progress Notes (Signed)
HPI the patient returns today for followup. CT scan in 6 months since her resection he is negative for recurrence. 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 She will be followed by Dr. Laurie Panda. I will see her again if she has any future problems.   Current Outpatient Prescriptions  Medication Sig Dispense Refill  . ALPRAZolam (XANAX) 0.5 MG tablet Take 0.5 mg by mouth 3 (three) times daily as needed. As needed for anxiety      . calcium citrate-vitamin D (CITRACAL+D) 315-200 MG-UNIT per tablet Take 1 tablet by mouth daily.        . citalopram (CELEXA) 20 MG tablet Take 20 mg by mouth daily.        Marland Kitchen levothyroxine (SYNTHROID, LEVOTHROID) 25 MCG tablet Take 25 mcg by mouth daily.        Marland Kitchen omeprazole (PRILOSEC) 20 MG capsule Take 20 mg by mouth as needed. For gastric reflux         Review of Systems: Unchanged  Physical Exam  Cardiovascular: Normal rate, regular rhythm and normal heart sounds.   Pulmonary/Chest: Effort normal and breath sounds normal.     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 Mariel Sleet

## 2011-11-13 ENCOUNTER — Telehealth (HOSPITAL_COMMUNITY): Payer: Self-pay | Admitting: Oncology

## 2011-12-14 ENCOUNTER — Other Ambulatory Visit: Payer: Self-pay | Admitting: Physician Assistant

## 2011-12-14 ENCOUNTER — Ambulatory Visit
Admission: RE | Admit: 2011-12-14 | Discharge: 2011-12-14 | Disposition: A | Discharge status: Home / Self Care | Payer: Medicare Other | Source: Ambulatory Visit | Attending: Physician Assistant | Admitting: Physician Assistant

## 2011-12-14 DIAGNOSIS — Z85118 Personal history of other malignant neoplasm of bronchus and lung: Secondary | ICD-10-CM

## 2012-01-07 ENCOUNTER — Telehealth (HOSPITAL_COMMUNITY): Payer: Self-pay | Admitting: Oncology

## 2012-01-08 ENCOUNTER — Other Ambulatory Visit (HOSPITAL_COMMUNITY): Payer: Self-pay

## 2012-01-23 ENCOUNTER — Encounter (HOSPITAL_COMMUNITY): Payer: Medicare Other | Attending: Hematology and Oncology

## 2012-01-23 DIAGNOSIS — Z09 Encounter for follow-up examination after completed treatment for conditions other than malignant neoplasm: Secondary | ICD-10-CM | POA: Insufficient documentation

## 2012-01-23 DIAGNOSIS — Z85118 Personal history of other malignant neoplasm of bronchus and lung: Secondary | ICD-10-CM | POA: Insufficient documentation

## 2012-01-23 DIAGNOSIS — C3490 Malignant neoplasm of unspecified part of unspecified bronchus or lung: Secondary | ICD-10-CM

## 2012-01-23 DIAGNOSIS — C3430 Malignant neoplasm of lower lobe, unspecified bronchus or lung: Secondary | ICD-10-CM

## 2012-01-23 LAB — COMPREHENSIVE METABOLIC PANEL
ALT: 10 U/L (ref 0–35)
AST: 14 U/L (ref 0–37)
Albumin: 4.1 g/dL (ref 3.5–5.2)
Alkaline Phosphatase: 80 U/L (ref 39–117)
BUN: 9 mg/dL (ref 6–23)
CO2: 29 mEq/L (ref 19–32)
Calcium: 10 mg/dL (ref 8.4–10.5)
Chloride: 101 mEq/L (ref 96–112)
Creatinine, Ser: 0.62 mg/dL (ref 0.50–1.10)
GFR calc Af Amer: >90 mL/min (ref >90)
GFR calc non Af Amer: 89 mL/min — ABNORMAL LOW (ref >90)
Glucose, Bld: 99 mg/dL (ref 70–99)
Potassium: 4.8 mEq/L (ref 3.5–5.1)
Sodium: 138 mEq/L (ref 135–145)
Total Bilirubin: 0.5 mg/dL (ref 0.3–1.2)
Total Protein: 6.8 g/dL (ref 6.0–8.3)

## 2012-01-23 LAB — CBC
HCT: 41 % (ref 36.0–46.0)
Hemoglobin: 13.4 g/dL (ref 12.0–15.0)
MCH: 29.3 pg (ref 26.0–34.0)
MCHC: 32.7 g/dL (ref 30.0–36.0)
MCV: 89.5 fL (ref 78.0–100.0)
Platelets: 352 10*3/uL (ref 150–400)
RBC: 4.58 MIL/uL (ref 3.87–5.11)
RDW: 14 % (ref 11.5–15.5)
WBC: 7.7 10*3/uL (ref 4.0–10.5)

## 2012-01-23 LAB — DIFFERENTIAL
Basophils Absolute: 0.1 10*3/uL (ref 0.0–0.1)
Basophils Relative: 1 % (ref 0–1)
Eosinophils Absolute: 0.2 10*3/uL (ref 0.0–0.7)
Eosinophils Relative: 2 % (ref 0–5)
Lymphocytes Relative: 17 % (ref 12–46)
Lymphs Abs: 1.3 10*3/uL (ref 0.7–4.0)
Monocytes Absolute: 0.5 10*3/uL (ref 0.1–1.0)
Monocytes Relative: 6 % (ref 3–12)
Neutro Abs: 5.8 10*3/uL (ref 1.7–7.7)
Neutrophils Relative %: 75 % (ref 43–77)

## 2012-01-24 ENCOUNTER — Ambulatory Visit (HOSPITAL_COMMUNITY)
Admission: RE | Admit: 2012-01-24 | Discharge: 2012-01-24 | Disposition: A | Discharge status: Home / Self Care | Payer: Medicare Other | Source: Ambulatory Visit | Attending: Hematology and Oncology | Admitting: Hematology and Oncology

## 2012-01-24 ENCOUNTER — Encounter (HOSPITAL_COMMUNITY): Payer: Self-pay

## 2012-01-24 DIAGNOSIS — J4489 Other specified chronic obstructive pulmonary disease: Secondary | ICD-10-CM | POA: Insufficient documentation

## 2012-01-24 DIAGNOSIS — J449 Chronic obstructive pulmonary disease, unspecified: Secondary | ICD-10-CM | POA: Insufficient documentation

## 2012-01-24 DIAGNOSIS — N200 Calculus of kidney: Secondary | ICD-10-CM | POA: Insufficient documentation

## 2012-01-24 DIAGNOSIS — C3490 Malignant neoplasm of unspecified part of unspecified bronchus or lung: Secondary | ICD-10-CM

## 2012-01-24 DIAGNOSIS — K5730 Diverticulosis of large intestine without perforation or abscess without bleeding: Secondary | ICD-10-CM | POA: Insufficient documentation

## 2012-01-28 ENCOUNTER — Encounter (HOSPITAL_BASED_OUTPATIENT_CLINIC_OR_DEPARTMENT_OTHER): Payer: Medicare Other | Admitting: Oncology

## 2012-01-28 VITALS — BP 119/75 | HR 80 | Temp 97.9°F | Wt 127.3 lb

## 2012-01-28 DIAGNOSIS — C3430 Malignant neoplasm of lower lobe, unspecified bronchus or lung: Secondary | ICD-10-CM

## 2012-01-28 DIAGNOSIS — C3490 Malignant neoplasm of unspecified part of unspecified bronchus or lung: Secondary | ICD-10-CM

## 2012-01-28 NOTE — Patient Instructions (Signed)
[name]  [id number] 1941/09/01 Dr. Glenford Peers   [facility]  Discharge Instructions  RECOMMENDATIONS MADE BY THE CONSULTANT AND ANY TEST RESULTS WILL BE SENT TO YOUR REFERRING DOCTOR.   EXAM FINDINGS BY MD TODAY AND SIGNS AND SYMPTOMS TO REPORT TO CLINIC OR PRIMARY MD: You are doing well.  Scans and blood work were good.  MEDICATIONS PRESCRIBED: none   INSTRUCTIONS GIVEN AND DISCUSSED: Other :  Report increased shortness of breath, blood in sputum, bone pain, etc.  SPECIAL INSTRUCTIONS/FOLLOW-UP: Lab work Needed in October, Xray Studies Needed in October and Return to Clinic for follow-up after labs and scans.   I acknowledge that I have been informed and understand all the instructions given to me and received a copy. I do not have any more questions at this time, but understand that I may call the Specialty Clinic at [facility] at [phone] during business hours should I have any further questions or need assistance in obtaining follow-up care.    __________________________________________  _____________  __________ Signature of Patient or Authorized Representative            Date                   Time    __________________________________________ Nurse's Signature

## 2012-01-28 NOTE — Progress Notes (Signed)
This office note has been dictated.

## 2012-01-28 NOTE — Progress Notes (Signed)
Labs drawn

## 2012-01-28 NOTE — Progress Notes (Signed)
CC:   [name], M.D. 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  Primary was 2.2 cm.  No lymphovascular space invasion was noted.  Margins were clear.  No perineural space invasion was noted, and 14 nodes were negative.  She is here for routine follow-up.  Her CT scan the other day including chest, abdomen and pelvis were all negative for recurrent disease or new disease.  Her labs were also excellent the other day.  She occasionally has a little chest discomfort in the site of the scar area.  It comes and goes.  It does not keep her from doing what she wants and she remains fully functional.  She just did not like the barium that she had to swallow the other day for the scan.  It gave her nausea, diarrhea and abdominal discomfort. That has now passed.  PHYSICAL EXAMINATION:  Her vital signs are all very stable.  She looks great.  She is asymptomatic.  She has no lymphadenopathy in the cervical, supraclavicular, infraclavicular, axillary, or inguinal areas. Her lungs are clear.  Heart shows a regular rhythm and rate without murmur, rub or gallop.  Breast exam is negative.  Abdomen is soft and nontender without organomegaly.  Bowel sounds are normal.  She has not had mammography for 2 years, so we will set that up.  We will see her back in 6 months with an unenhanced CT of the chest and some blood work.    ______________________________ Ladona Horns. Mariel Sleet, MD ESN/MEDQ  D:  01/28/2012  T:  01/28/2012  Job:  [id number]

## 2012-02-06 ENCOUNTER — Other Ambulatory Visit: Payer: Self-pay | Admitting: Family Medicine

## 2012-02-06 DIAGNOSIS — Z139 Encounter for screening, unspecified: Secondary | ICD-10-CM

## 2012-02-11 ENCOUNTER — Ambulatory Visit (INDEPENDENT_AMBULATORY_CARE_PROVIDER_SITE_OTHER): Payer: Medicare Other | Admitting: Gastroenterology

## 2012-02-11 ENCOUNTER — Encounter: Payer: Self-pay | Admitting: Gastroenterology

## 2012-02-11 ENCOUNTER — Ambulatory Visit (HOSPITAL_COMMUNITY)
Admission: RE | Admit: 2012-02-11 | Discharge: 2012-02-11 | Disposition: A | Discharge status: Home / Self Care | Payer: Medicare Other | Source: Ambulatory Visit | Attending: Family Medicine | Admitting: Family Medicine

## 2012-02-11 VITALS — BP 105/61 | HR 79 | Temp 97.0°F | Ht 65.0 in | Wt 123.4 lb

## 2012-02-11 DIAGNOSIS — R1013 Epigastric pain: Secondary | ICD-10-CM

## 2012-02-11 DIAGNOSIS — R1310 Dysphagia, unspecified: Secondary | ICD-10-CM

## 2012-02-11 DIAGNOSIS — K3189 Other diseases of stomach and duodenum: Secondary | ICD-10-CM

## 2012-02-11 DIAGNOSIS — R1314 Dysphagia, pharyngoesophageal phase: Secondary | ICD-10-CM

## 2012-02-11 DIAGNOSIS — Z1231 Encounter for screening mammogram for malignant neoplasm of breast: Secondary | ICD-10-CM | POA: Insufficient documentation

## 2012-02-11 DIAGNOSIS — R1319 Other dysphagia: Secondary | ICD-10-CM

## 2012-02-11 DIAGNOSIS — I729 Aneurysm of unspecified site: Secondary | ICD-10-CM

## 2012-02-11 DIAGNOSIS — Z139 Encounter for screening, unspecified: Secondary | ICD-10-CM

## 2012-02-11 MED ORDER — PANTOPRAZOLE SODIUM 40 MG PO TBEC: 40.0000 mg | DELAYED_RELEASE_TABLET | Freq: Every day | ORAL | Status: DC

## 2012-02-11 NOTE — Assessment & Plan Note (Signed)
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 Appears present from Feb 2012, with measurements at that time: right common iliac artery measuring 2 cm and the infrarenal  abdominal aorta measuring 3.0 x 2.8 cm. Now, right common iliac artery aneurysm measuring 2.3 cm, mildly increased, infrarenal abdominal aortic aneurysm 3.4, mild increase.  Unclear if pt is being followed by vascular specialists for this. Unaware of this until after pt left. Will contact her to find out.

## 2012-02-11 NOTE — Progress Notes (Signed)
Pt has evidence of aortic and iliac artery aneurysms on CT. This is not a new finding. Can we see if she is aware of this, and if anyone is following her for this?

## 2012-02-11 NOTE — Progress Notes (Signed)
Referring Provider: [name], MD Primary Care Physician:  [name], MD, MD Primary Gastroenterologist: Dr. Darrick Penna   Chief Complaint  Patient presents with  . Abdominal Pain    HPI:   70 year old female last seen one year ago. Here for f/u of dysphagia, epigastric pain. Hx of PUD in past; most recent EGD Feb 2011 with +H.pylori, gastric ulceration, s/p dilation. UGI/BPE performed after last visit, tiny sliding hiatal hernia, otherwise negative. Was actually set up for an EGD due to continued dysphagia in June 2012, yet this was cancelled. For unclear reasons, decided to cancel.  Notes epigastric pain, worse with eating, flares intermittently. Poor to no appetite. + early satiety. Intermittent dysphagia, down 5 lbs from May 2012. Occasional odynophagia depending on types of food. Gave up bread and careful with potatoes. Ran out of omeprazole few weeks ago. Was taking 1-2 times per day.   Hx of lung cancer, s/p surgery as outlined below. Followed by Dr. Mariel Sleet. April 2013 CT chest/abd:   CT chest IMPRESSION:  Postoperative changes in the left lower lobe. No change. No  evidence of recurrent or metastatic disease.  CT abdomen IMPRESSION:  No evidence of metastatic disease on this unenhanced study.  Colonic diverticulosis.  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  Right nephrolithiasis.   Past Medical History  Diagnosis Date  . COPD (chronic obstructive pulmonary disease)   . Anxiety disorder   . Hypothyroidism   . PUD (peptic ulcer disease)     100yrs  . Dysphagia   . IBS (irritable bowel syndrome)   . Diverticulosis   . Trigeminal neuralgia   . Diastolic dysfunction   . Depression   . Allergic rhinitis   . Adenocarcinoma of lung 12/2010    left lung/surg only    Past Surgical History  Procedure Date  . 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 Esophagogastroduodenoscopy 11/13/2009    w/dilation to  14mm, gastric ulceration (H.Pylori) s/p treatment  . Esophagogastroduodenoscopy 11/21/2009    distal esophageal web, gastritis  . Lung cancer surgery 12/2010    Left VATS, minithoracotomy, LLL superior segmentectomy  . Vocal cord surgery 02/06/2011    laryngoscopy with bilateral vocal cord Radiesse injection for vocal cord paralysis  . Abdominal hysterectomy   . Cholecystectomy   . Tubal ligation   . Cataract extraction   . Fatty tumor removal from lt groin     Current Outpatient Prescriptions  Medication Sig Dispense Refill  . ALPRAZolam (XANAX) 0.5 MG tablet Take 0.5 mg by mouth 3 (three) times daily as needed. As needed for anxiety      . calcium citrate-vitamin D (CITRACAL+D) 315-200 MG-UNIT per tablet Take 1 tablet by mouth daily.        . fish oil-omega-3 fatty acids 1000 MG capsule Take 2 g by mouth daily.      Marland Kitchen levothyroxine (SYNTHROID, LEVOTHROID) 25 MCG tablet Take 25 mcg by mouth daily.        . pravastatin (PRAVACHOL) 20 MG tablet Take 20 mg by mouth daily.      . citalopram (CELEXA) 20 MG tablet Take 20 mg by mouth daily.        . pantoprazole (PROTONIX) 40 MG tablet Take 1 tablet (40 mg total) by mouth daily.  30 tablet  1    Allergies as of 02/11/2012 - Review Complete 02/11/2012  Allergen Reaction Noted  . Ciprofloxacin hcl Hives 07/23/2011  . Iohexol  10/31/2010  .  Sulfonamide derivatives    . Prozac (fluoxetine hcl) Rash 05/11/2011    Family History  Problem Relation Age of Onset  . Liver cancer Sister   . Pulmonary embolism Mother   . Heart attack Father     History   Social History  . Marital Status: Single    Spouse Name: N/A    Number of Children: N/A  . Years of Education: N/A   Social History Main Topics  . Smoking status: Former Smoker    Quit date: 12/21/2010  . Smokeless tobacco: Never Used  . Alcohol Use: No  . Drug Use: No  . Sexually Active: None   Other Topics Concern  . None   Social History Narrative  . None    Review of  Systems: Gen: SEE HPI  CV: Denies chest pain, palpitations, syncope, peripheral edema, and claudication. Resp: Denies dyspnea at rest, cough, wheezing, coughing up blood, and pleurisy. GI: SEE HPI Derm: Denies rash, itching, dry skin Psych: Denies depression, anxiety, memory loss, confusion. No homicidal or suicidal ideation.  Heme: Denies bruising, bleeding, and enlarged lymph nodes.  Physical Exam: BP 105/61  Pulse 79  Temp(Src) 97 F (36.1 C) (Temporal)  Ht 5\' 5"  (1.651 m)  Wt 123 lb 6.4 oz (55.974 kg)  BMI 20.53 kg/m2 General:   Alert and oriented. No distress noted. Pleasant and cooperative.  Head:  Normocephalic and atraumatic. Eyes:  Conjuctiva clear without scleral icterus. Mouth:  Oral mucosa pink and moist.  Neck:  Supple, without mass or thyromegaly. Heart:  S1, S2 present without murmurs, rubs, or gallops. Regular rate and rhythm. Abdomen:  +BS, soft, non-tender and non-distended. No rebound or guarding. No HSM or masses noted. Msk:  Symmetrical without gross deformities. Normal posture. Extremities:  Without edema. Neurologic:  Alert and  oriented x4;  grossly normal neurologically. Skin:  Intact without significant lesions or rashes. Cervical Nodes:  No significant cervical adenopathy. Psych:  Alert and cooperative. Normal mood and affect.

## 2012-02-11 NOTE — Assessment & Plan Note (Signed)
Hx of dilations in past. Last EGD Feb 2011. Proceed with dilation at time of EGD.

## 2012-02-11 NOTE — Patient Instructions (Signed)
Stop Omeprazole. Start taking Protonix each morning, 30 minutes before breakfast. 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 The prescription has been sent to your pharmacy.  Please call us back with good times for you to do the upper endoscopy. We need to do this in the very near future due to your symptoms.  Further recommendations after the endoscopy is completed.

## 2012-02-11 NOTE — Assessment & Plan Note (Addendum)
71 year old female with hx of PUD in remote past, most recent EGD in Feb 2011 with + gastric ulcer, + h.pylori, s/p dilation for dysphagia. UGI/BPE subsequently performed in 2012 without significant findings. Pt was set up for EGD due to continued dysphagia, but she cancelled for unclear reasons. Returns today with + early satiety, epigastric pain worsened with eating X 3 weeks. Down 5 lbs from last visit in May 2012. Needs upper GI evaluation to assess for ulcers, gastritis, esophageal web, ring, or stricture. Low likelihood of malignancy.   Proceed with upper endoscopy/dilation in the near future with Dr. Darrick Penna. The risks, benefits, and alternatives have been discussed in detail with patient. They have stated understanding and desire to proceed.  Stop Prilosec. Pt ran out recently, will trial Protonix (possible interaction with Prilosec and Celexa)

## 2012-02-12 NOTE — Progress Notes (Signed)
Pt said she was not aware of the aneurysms. She said she knew that her PCP was not aware.

## 2012-02-12 NOTE — Progress Notes (Signed)
Faxed to PCP

## 2012-02-12 NOTE — Progress Notes (Signed)
Doris, Don't know if you sent a note with your addendum:) Just wanted to see if pt was aware of aneurysm hx, and if she is being followed by anyone?

## 2012-02-13 ENCOUNTER — Telehealth: Payer: Self-pay

## 2012-02-13 NOTE — Telephone Encounter (Signed)
Pt left VM and I returned her call. She is very concerned about the aneurysms because she had a friend die in her 59's with an aneurysm. She said that she is so glad that Gerrit Halls, NP is following up on it, because it has not been mentioned to her. She has appt with Dr. Tanya Nones on Friday. She just wanted Tobi Bastos to know she is very concerned and she is so appreciative that she is following up. She will wait to hear back from [place] today.

## 2012-02-13 NOTE — Telephone Encounter (Signed)
Called and informed Pt. She is feeling better about it. She does want to see Dr. Edwyna Shell or someone in that group. She is aware that Crystal will be contacting her.

## 2012-02-13 NOTE — Progress Notes (Signed)
Pt to be referred to cardiology for assessment, following.

## 2012-02-13 NOTE — Telephone Encounter (Signed)
Appears she has been seen by Dr. Edwyna Shell, who I believe is a cardiothoracic surgeon.  At this point, we can refer to a cardiologist or vascular specialist. I am unsure if Dr. Edwyna Shell follows this or not. We can set her up to see cardiology, because they will do serial scans/ultrasounds to monitor the aneurysms. They are not large enough to require surgery. Also, reassure her they have not progressed much since first seen on a scan.

## 2012-02-14 ENCOUNTER — Telehealth: Payer: Self-pay | Admitting: Gastroenterology

## 2012-02-14 NOTE — Telephone Encounter (Signed)
Referral faxed [facility]

## 2012-02-14 NOTE — Telephone Encounter (Signed)
Open in error

## 2012-02-14 NOTE — Telephone Encounter (Signed)
Pt needs to be referred to [facility]- records faxed- tried to call pt but no answer

## 2012-02-18 ENCOUNTER — Telehealth: Payer: Self-pay

## 2012-02-18 NOTE — Telephone Encounter (Signed)
Called pt to schedule- she stated that she had been thinking about it and would like to wait until after she sees the surgeon- will call me back next week if she wants to proceed with the EGD

## 2012-02-18 NOTE — Telephone Encounter (Signed)
Pt informed. Said not to schedule on Mon or tues next week.

## 2012-02-18 NOTE — Telephone Encounter (Signed)
Pt called and said she is having some nausea. It was very bad over the week-end. She did not eat that much of anything either. She is taking the Protonix qd and said that is helping more than the Omeprazole. She said she had H. Pylori x 2 years ago and wonders if she might have it again. She still has discomfort when swallowing foods and she does not have much of an appetite. ( She has appt with [facility] next week).  Please advise !

## 2012-02-18 NOTE — Telephone Encounter (Signed)
On my office visit, I had wanted her scheduled for an upper endoscopy with dilation. Not sure what happened to this? That is what she needs.

## 2012-02-18 NOTE — Telephone Encounter (Signed)
Continue Protonix daily.  Avoid fatty, greasy foods.  Chew foods well, take small bites. Sit upright after eating for at least an hour.  Ultimately needs EGD. Please let pt know this is what we recommend, but I understand her wanting to wait until she sees cardiologist.

## 2012-02-18 NOTE — Telephone Encounter (Signed)
Pt informed

## 2012-02-25 ENCOUNTER — Encounter: Payer: Self-pay | Admitting: Vascular Surgery

## 2012-02-26 ENCOUNTER — Encounter: Payer: Self-pay | Admitting: Vascular Surgery

## 2012-02-26 ENCOUNTER — Ambulatory Visit (INDEPENDENT_AMBULATORY_CARE_PROVIDER_SITE_OTHER): Payer: Medicare Other | Admitting: Vascular Surgery

## 2012-02-26 VITALS — BP 108/56 | HR 107 | Temp 98.0°F | Ht 65.5 in | Wt 123.4 lb

## 2012-02-26 DIAGNOSIS — I714 Abdominal aortic aneurysm, without rupture: Secondary | ICD-10-CM | POA: Insufficient documentation

## 2012-02-26 NOTE — Progress Notes (Signed)
[facility]  Vascular Consult Note    Patient name: [name] MRN: [id number] DOB: 1941-01-19 Sex: female   Referred by: Tanya Nones  Reason for referral: AAA  HISTORY OF PRESENT ILLNESS: The patient has today for discussion of her abdominal aortic aneurysm. As was discovered incidentally with imaging for evaluation of metastatic workup for lung cancer. 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 She is here for further discussion of this. This had been seen on prior scans with slight continued dilatation. She denies history of cardiac disease denies history of stroke. He has had resection for lung cancer and March 2012  Past Medical History  Diagnosis Date  . COPD (chronic obstructive pulmonary disease)   . Anxiety disorder   . Hypothyroidism   . PUD (peptic ulcer disease)     37yrs  . Dysphagia   . IBS (irritable bowel syndrome)   . Diverticulosis   . Trigeminal neuralgia   . Diastolic dysfunction   . Depression   . Allergic rhinitis   . Adenocarcinoma of lung 12/2010    left lung/surg only    Past Surgical History  Procedure Date  . 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 Esophagogastroduodenoscopy 11/13/2009    w/dilation to 14mm, gastric ulceration (H.Pylori) s/p treatment  . Esophagogastroduodenoscopy 11/21/2009    distal esophageal web, gastritis  . Lung cancer surgery 12/2010    Left VATS, minithoracotomy, LLL superior segmentectomy  . Vocal cord surgery 02/06/2011    laryngoscopy with bilateral vocal cord Radiesse injection for vocal cord paralysis  . Abdominal hysterectomy   . Cholecystectomy   . Tubal ligation   . Cataract extraction   . Fatty tumor removal from lt groin     History   Social History  . Marital Status: Single    Spouse Name: N/A    Number of Children: N/A  . Years of Education: N/A    Occupational History  . Not on file.   Social History Main Topics  . Smoking status: Current Everyday Smoker -- 0.5 packs/day for 20 years    Types: Cigarettes  . Smokeless tobacco: Never Used   Comment: smoking cessation info given and reviewed   . Alcohol Use: Yes     occasional  . Drug Use: No  . Sexually Active: Not on file   Other Topics Concern  . Not on file   Social History Narrative  . No narrative on file    Family History  Problem Relation Age of Onset  . Liver cancer Sister   . Pulmonary embolism Mother   . Heart attack Father   . Hypertension Father     Allergies  Allergen Reactions  . Ciprofloxacin Hcl Hives  . Iohexol      Desc: Per [facility], pt is allergic to IV contrast, no type of reaction was available. Pt cannot remember but first reacted around 15 yrs ago from an IVP. Notes were date from 2009 at urology center., Onset Date: [id number]   . Sulfonamide Derivatives   . Prozac (Fluoxetine Hcl) Rash    Prior to Admission medications   Medication Sig Start Date End Date Taking? Authorizing Provider  ALPRAZolam Prudy Feeler) 0.5 MG tablet Take 0.5 mg by mouth 3 (three) times daily as needed. As needed for anxiety   Yes Historical Provider, MD  calcium citrate-vitamin D (CITRACAL+D) 315-200  MG-UNIT per tablet Take 1 tablet by mouth daily.     Yes Historical Provider, MD  citalopram (CELEXA) 20 MG tablet Take 20 mg by mouth daily.     Yes Historical Provider, MD  fish oil-omega-3 fatty acids 1000 MG capsule Take 2 g by mouth daily.   Yes Historical Provider, MD  levothyroxine (SYNTHROID, LEVOTHROID) 25 MCG tablet Take 25 mcg by mouth daily.     Yes Historical Provider, MD  pantoprazole (PROTONIX) 40 MG tablet Take 1 tablet (40 mg total) by mouth daily. 02/11/12 02/10/13 Yes Nira Retort, NP  pravastatin (PRAVACHOL) 20 MG tablet Take 20 mg by mouth daily.   Yes Historical Provider, MD     REVIEW OF SYSTEMS: Negative except for a past medical  history PHYSICAL EXAMINATION:  Filed Vitals:   02/26/12 0903  BP: 108/56  Pulse: 107  Temp: 98 F (36.7 C)    General: The patient appears their stated age. Pulmonary: There is a good air exchange bilaterally without wheezing or rales. Abdomen: Soft and non-tender with normal pitch bowel sounds. Prominent aortic pulsation no tenderness Musculoskeletal: There are no major deformities.  There is no significant extremity pain. Neurologic: No focal weakness or paresthesias are detected, Skin: There are no ulcer or rashes noted. Psychiatric: The patient has normal affect. Cardiovascular: There is a regular rate and rhythm without significant murmur appreciated.  Diagnostic Studies: CT scan reviewed as outlined above   Medication Changes: None  Assessment:  Infrarenal abdominal aortic aneurysm and iliac artery aneurysm  Plan: I discussed this at length with the patient. I explained that these were small and that we would recommend yearly surveillance. If she does have other imaging studies for followup of her lung cancer then we would defer the ultrasound if we get this information. I explained symptoms of leaking aneurysm and she knows to report immediately to the emergency room should this occur. I explained this would be quite unlikely at the small size of her current aortic and right common iliac artery aneurysm. Pain otherwise she should have no restrictions other than good blood pressure control. She actually has low blood pressure. We will see her again in one year for continued followup  Jarica Plass 5/21/201311:26 AM

## 2012-02-27 NOTE — Progress Notes (Signed)
Addended by: Sharee Pimple on: 02/27/2012 09:37 AM   Modules accepted: Orders

## 2012-02-29 ENCOUNTER — Other Ambulatory Visit: Payer: Self-pay | Admitting: Gastroenterology

## 2012-02-29 DIAGNOSIS — R1013 Epigastric pain: Secondary | ICD-10-CM

## 2012-03-10 ENCOUNTER — Encounter (HOSPITAL_COMMUNITY): Payer: Self-pay | Admitting: Pharmacy Technician

## 2012-03-13 MED ORDER — SODIUM CHLORIDE 0.45 % IV SOLN
Freq: Once | INTRAVENOUS | Status: AC
  Administered 2012-03-14: 11:00:00 via INTRAVENOUS

## 2012-03-14 ENCOUNTER — Encounter (HOSPITAL_COMMUNITY): Payer: Self-pay | Admitting: *Deleted

## 2012-03-14 ENCOUNTER — Telehealth: Payer: Self-pay | Admitting: Gastroenterology

## 2012-03-14 ENCOUNTER — Encounter (HOSPITAL_COMMUNITY)
Admission: RE | Disposition: A | Discharge status: Home / Self Care | Payer: Self-pay | Source: Ambulatory Visit | Attending: Gastroenterology

## 2012-03-14 ENCOUNTER — Ambulatory Visit (HOSPITAL_COMMUNITY)
Admission: RE | Admit: 2012-03-14 | Discharge: 2012-03-14 | Disposition: A | Discharge status: Home / Self Care | Payer: Medicare Other | Source: Ambulatory Visit | Attending: Gastroenterology | Admitting: Gastroenterology

## 2012-03-14 DIAGNOSIS — K2990 Gastroduodenitis, unspecified, without bleeding: Secondary | ICD-10-CM

## 2012-03-14 DIAGNOSIS — A048 Other specified bacterial intestinal infections: Secondary | ICD-10-CM | POA: Insufficient documentation

## 2012-03-14 DIAGNOSIS — R1013 Epigastric pain: Secondary | ICD-10-CM

## 2012-03-14 DIAGNOSIS — R1310 Dysphagia, unspecified: Secondary | ICD-10-CM | POA: Insufficient documentation

## 2012-03-14 DIAGNOSIS — K2970 Gastritis, unspecified, without bleeding: Secondary | ICD-10-CM

## 2012-03-14 DIAGNOSIS — K2940 Chronic atrophic gastritis without bleeding: Secondary | ICD-10-CM | POA: Insufficient documentation

## 2012-03-14 DIAGNOSIS — K222 Esophageal obstruction: Secondary | ICD-10-CM

## 2012-03-14 DIAGNOSIS — J4489 Other specified chronic obstructive pulmonary disease: Secondary | ICD-10-CM | POA: Insufficient documentation

## 2012-03-14 DIAGNOSIS — J449 Chronic obstructive pulmonary disease, unspecified: Secondary | ICD-10-CM | POA: Insufficient documentation

## 2012-03-14 DIAGNOSIS — K449 Diaphragmatic hernia without obstruction or gangrene: Secondary | ICD-10-CM | POA: Insufficient documentation

## 2012-03-14 HISTORY — PX: ESOPHAGOGASTRODUODENOSCOPY: SHX1529

## 2012-03-14 SURGERY — ESOPHAGOGASTRODUODENOSCOPY (EGD) WITH ESOPHAGEAL DILATION
Anesthesia: Moderate Sedation

## 2012-03-14 MED ORDER — MINERAL OIL PO OIL
TOPICAL_OIL | ORAL | Status: AC
  Filled 2012-03-14: qty 60

## 2012-03-14 MED ORDER — STERILE WATER FOR IRRIGATION IR SOLN
Status: DC | PRN
  Administered 2012-03-14: 11:00:00

## 2012-03-14 MED ORDER — BUTAMBEN-TETRACAINE-BENZOCAINE 2-2-14 % EX AERO
INHALATION_SPRAY | CUTANEOUS | Status: DC | PRN
  Administered 2012-03-14: 2 via TOPICAL

## 2012-03-14 MED ORDER — MIDAZOLAM HCL 5 MG/5ML IJ SOLN
INTRAMUSCULAR | Status: DC | PRN
  Administered 2012-03-14: 2 mg via INTRAVENOUS

## 2012-03-14 MED ORDER — MEPERIDINE HCL 100 MG/ML IJ SOLN
INTRAMUSCULAR | Status: AC
  Filled 2012-03-14: qty 1

## 2012-03-14 MED ORDER — MIDAZOLAM HCL 5 MG/5ML IJ SOLN
INTRAMUSCULAR | Status: AC
  Filled 2012-03-14: qty 10

## 2012-03-14 MED ORDER — MEPERIDINE HCL 100 MG/ML IJ SOLN
INTRAMUSCULAR | Status: DC | PRN
  Administered 2012-03-14 (×2): 25 mg via INTRAVENOUS

## 2012-03-14 NOTE — Op Note (Signed)
[facility] [address] [place], [place]  [address]  ENDOSCOPY PROCEDURE REPORT  PATIENT:  [name], [name]  MR#:  [id number] BIRTHDATE:  [birth year], 70 yrs. old  GENDER:  female  ENDOSCOPIST:  Jonette Eva, MD ASSISTANT: Referred by:  Lynnea Ferrier, M.D.  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  The EG-[id number] ([id number]) endoscope was introduced through the mouth and advanced to the second portion of the duodenum.  The instrument was slowly withdrawn as the mucosa was carefully examined.  Prior to withdrawal of the scope, the guidwire was placed.  The esophagus was dilated successfully.  The patient was recovered in endoscopy and discharged home in satisfactory condition. <<PROCEDUREIMAGES>>  A stricture was found in the distal esophagus.  A 2-3 CM hiatal hernia was found.  Mild gastritis was found & BIOPSIED VIA COLD FORCEPS. NL DUODENUM. Dilation was then performed at the total esophagus.  1) Dilator:  Savary over guidewire  Size(s):  12.8-16 MM Resistance:  minimal  Heme:  none Appearance:  COMPLICATIONS:  None  ENDOSCOPIC IMPRESSION: 1) Stricture in the distal esophagus 2) SMALL Hiatal hernia 3) Mild gastritis  RECOMMENDATIONS: AWAIT BIOPSIES LOW FAT DIET PROTONIX 30 MINUTES PRIOR TO MEALS OPV IN 3 MOS  REPEAT EXAM:  No  ______________________________ Jonette Eva, MD  CC:  n. eSIGNED:   Nyashia Raney at 03/14/2012 01:33 PM  Unice Cobble, [phone]

## 2012-03-14 NOTE — Discharge Instructions (Signed)
I dilated your esophagus. You have a stricture near the base of your esophagus. You have a small hiatal hernia. You have gastritis. I biopsied your stomach.   CONTINUE PROTONIX. TAKE 30 MINUTES PRIOR TO YOUR FIRST MEAL.  FOLLOW A LOW FAT DIET. SEE INFO BELOW.  YOUR BIOPSY WILL BE BACK IN 7 DAYS.  FOLLOW UP IN 3 MOS.   UPPER ENDOSCOPY AFTER CARE Read the instructions outlined below and refer to this sheet in the next week. These discharge instructions provide you with general information on caring for yourself after you leave the hospital. While your treatment has been planned according to the most current medical practices available, unavoidable complications occasionally occur. If you have any problems or questions after discharge, call DR. Akhila Mahnken, [phone].  ACTIVITY  You may resume your regular activity, but move at a slower pace for the next 24 hours.   Take frequent rest periods for the next 24 hours.   Walking will help get rid of the air and reduce the bloated feeling in your belly (abdomen).   No driving for 24 hours (because of the medicine (anesthesia) used during the test).   You may shower.   Do not sign any important legal documents or operate any machinery for 24 hours (because of the anesthesia used during the test).    NUTRITION  Drink plenty of fluids.   You may resume your normal diet as instructed by your doctor.   Begin with a light meal and progress to your normal diet. Heavy or fried foods are harder to digest and may make you feel sick to your stomach (nauseated).   Avoid alcoholic beverages for 24 hours or as instructed.    MEDICATIONS  You may resume your normal medications.   WHAT YOU CAN EXPECT TODAY  Some feelings of bloating in the abdomen.   Passage of more gas than usual.    IF YOU HAD A BIOPSY TAKEN DURING THE UPPER ENDOSCOPY:  Eat a soft diet IF YOU HAVE NAUSEA, BLOATING, ABDOMINAL PAIN, OR VOMITING.    FINDING OUT THE  RESULTS OF YOUR TEST Not all test results are available during your visit. DR. Darrick Penna WILL CALL YOU WITHIN 7 DAYS OF YOUR PROCEDUE WITH YOUR RESULTS. Do not assume everything is normal if you have not heard from DR. Babe Clenney IN ONE WEEK, CALL HER OFFICE AT [phone].  SEEK IMMEDIATE MEDICAL ATTENTION AND CALL THE OFFICE: [phone] IF:  You have more than a spotting of blood in your stool.   Your belly is swollen (abdominal distention).   You are nauseated or vomiting.   You have a temperature over 101F.   You have abdominal pain or discomfort that is severe or gets worse throughout the day.    Low-Fat Diet BREADS, CEREALS, PASTA, RICE, DRIED PEAS, AND BEANS These products are high in carbohydrates and most are low in fat. Therefore, they can be increased in the diet as substitutes for fatty foods. They too, however, contain calories and should not be eaten in excess. Cereals can be eaten for snacks as well as for breakfast.  Include foods that contain fiber (fruits, vegetables, whole grains, and legumes). Research shows that fiber may lower blood cholesterol levels, especially the water-soluble fiber found in fruits, vegetables, oat products, and legumes. FRUITS AND VEGETABLES It is good to eat fruits and vegetables. Besides being sources of fiber, both are rich in vitamins and some minerals. They help you get the daily allowances of these nutrients. Fruits and  vegetables can be used for snacks and desserts. MEATS Limit lean meat, chicken, [place], and fish to no more than 6 ounces per day. Beef, Pork, and Lamb Use lean cuts of beef, pork, and lamb. Lean cuts include:  Extra-lean ground beef.  Arm roast.  Sirloin tip.  Center-cut ham.  Round steak.  Loin chops.  Rump roast.  Tenderloin.  Trim all fat off the outside of meats before cooking. It is not necessary to severely decrease the intake of red meat, but lean choices should be made. Lean meat is rich in protein and contains  a highly absorbable form of iron. Premenopausal women, in particular, should avoid reducing lean red meat because this could increase the risk for low red blood cells (iron-deficiency anemia).  Chicken and [place] These are good sources of protein. The fat of poultry can be reduced by removing the skin and underlying fat layers before cooking. Chicken and [place] can be substituted for lean red meat in the diet. Poultry should not be fried or covered with high-fat sauces. Fish and Shellfish Fish is a good source of protein. Shellfish contain cholesterol, but they usually are low in saturated fatty acids. The preparation of fish is important. Like chicken and [place], they should not be fried or covered with high-fat sauces. EGGS Egg whites contain no fat or cholesterol. They can be eaten often. Try 1 to 2 egg whites instead of whole eggs in recipes or use egg substitutes that do not contain yolk.  MILK AND DAIRY PRODUCTS Use skim or 1% milk instead of 2% or whole milk. Decrease whole milk, natural, and processed cheeses. Use nonfat or low-fat (2%) cottage cheese or low-fat cheeses made from vegetable oils. Choose nonfat or low-fat (1 to 2%) yogurt. Experiment with evaporated skim milk in recipes that call for heavy cream. Substitute low-fat yogurt or low-fat cottage cheese for sour cream in dips and salad dressings. Have at least 2 servings of low-fat dairy products, such as 2 glasses of skim (or 1%) milk each day to help get your daily calcium intake.  FATS AND OILS Butterfat, lard, and beef fats are high in saturated fat and cholesterol. These should be avoided.Vegetable fats do not contain cholesterol. AVOID coconut oil, palm oil, and palm kernel oil, WHICH are very high in saturated fats. These should be limited. These fats are often used in bakery goods, processed foods, popcorn, oils, and nondairy creamers. Vegetable shortenings and some peanut butters contain hydrogenated oils, which are also  saturated fats. Read the labels on these foods and check for saturated vegetable oils.  Desirable liquid vegetable oils are corn oil, cottonseed oil, olive oil, canola oil, safflower oil, soybean oil, and sunflower oil. Peanut oil is not as good, but small amounts are acceptable. Buy a heart-healthy tub margarine that has no partially hydrogenated oils in the ingredients. AVOID Mayonnaise and salad dressings often are made from unsaturated fats.  OTHER EATING TIPS Snacks  Most sweets should be limited as snacks. They tend to be rich in calories and fats, and their caloric content outweighs their nutritional value. Some good choices in snacks are graham crackers, melba toast, soda crackers, bagels (no egg), English muffins, fruits, and vegetables. These snacks are preferable to snack crackers, [place] fries, and chips. Popcorn should be air-popped or cooked in small amounts of liquid vegetable oil.  Desserts Eat fruit, low-fat yogurt, and fruit ices instead of pastries, cake, and cookies. Sherbet, angel food cake, gelatin dessert, frozen low-fat yogurt, or other frozen  products that do not contain saturated fat (pure fruit juice bars, frozen ice pops) are also acceptable.   COOKING METHODS Choose those methods that use little or no fat. They include: Poaching.  Braising.  Steaming.  Grilling.  Baking.  Stir-frying.  Broiling.  Microwaving.  Foods can be cooked in a nonstick pan without added fat, or use a nonfat cooking spray in regular cookware. Limit fried foods and avoid frying in saturated fat. Add moisture to lean meats by using water, broth, cooking wines, and other nonfat or low-fat sauces along with the cooking methods mentioned above. Soups and stews should be chilled after cooking. The fat that forms on top after a few hours in the refrigerator should be skimmed off. When preparing meals, avoid using excess salt. Salt can contribute to raising blood pressure in some people.  EATING  AWAY FROM HOME Order entres, potatoes, and vegetables without sauces or butter. When meat exceeds the size of a deck of cards (3 to 4 ounces), the rest can be taken home for another meal. Choose vegetable or fruit salads and ask for low-calorie salad dressings to be served on the side. Use dressings sparingly. Limit high-fat toppings, such as bacon, crumbled eggs, cheese, sunflower seeds, and olives. Ask for heart-healthy tub margarine instead of butter.   Gastritis  Gastritis is an inflammation (the body's way of reacting to injury and/or infection) of the stomach. It is often caused by viral or bacterial (germ) infections. It can also be caused BY ASPIRIN, BC/GOODY POWDER'S, (IBUPROFEN) MOTRIN, OR ALEVE (NAPROXEN), chemicals (including alcohol), SPICY FOODS, and medications. This illness may be associated with generalized malaise (feeling tired, not well), UPPER ABDOMINAL STOMACH cramps, and fever. One common bacterial cause of gastritis is an organism known as H. Pylori. This can be treated with antibiotics.   Hiatal Hernia A hiatal hernia occurs when a part of the stomach slides above the diaphragm. The diaphragm is the thin muscle separating the belly (abdomen) from the chest. A hiatal hernia can be something you are born with or develop over time. Hiatal hernias may allow stomach acid to flow back into your esophagus, the tube which carries food from your mouth to your stomach. If this acid causes problems it is called GERD (gastro-esophageal reflux disease).   SYMPTOMS Common symptoms of GERD are heartburn (burning in your chest). This is worse when lying down or bending over. It may also cause belching and indigestion. Some of the things which make GERD worse are:  Increased weight pushes on stomach making acid rise more easily.   Smoking markedly increases acid production.   Alcohol decreases lower esophageal sphincter pressure (valve between stomach and esophagus), allowing acid from  stomach into esophagus.   Late evening meals and going to bed with a full stomach increases pressure.   Anything that causes an increase in acid production.    HOME CARE INSTRUCTIONS  Try to achieve and maintain an ideal body weight.   Avoid drinking alcoholic beverages.   DO NOT smokE.   Do not wear tight clothing around your chest or stomach.   Eat smaller meals and eat more frequently. This keeps your stomach from getting too full. Eat slowly.   Do not lie down for 2 or 3 hours after eating. Do not eat or drink anything 1 to 2 hours before going to bed.   Avoid caffeine beverages (colas, coffee, cocoa, tea), fatty foods, citrus fruits and all other foods and drinks that contain acid and that  seem to increase the problems.   Avoid bending over, especially after eating OR STRAINING. Anything that increases the pressure in your belly increases the amount of acid that may be pushed up into your esophagus.    ESOPHAGEAL STRICTURE  Esophageal strictures can be caused by stomach acid backing up into the tube that carries food from the mouth down to the stomach (lower esophagus).  TREATMENT There are a number of non-prescription medicines used to treat reflux/stricture, including: Antacids.  Proton-pump inhibitors: PROTONIX   HOME CARE INSTRUCTIONS Eat 2-3 hours before going to bed.  Try to reach and maintain a healthy weight.  Do not eat just a few very large meals. Instead, eat 4 TO 6 smaller meals throughout the day.  Try to identify foods and beverages that make your symptoms worse, and avoid these.  Avoid tight clothing.  Do not exercise right after eating.

## 2012-03-14 NOTE — Telephone Encounter (Signed)
Darl Pikes from endo called Friday afternoon. Patient needs 3 month f/u with [facility] only E30 visit. Please schedule.

## 2012-03-14 NOTE — H&P (Signed)
Primary Care Physician:  [name], MD, MD Primary Gastroenterologist:  Dr. Darrick Penna  Pre-Procedure History & Physical: HPI:  [name] is a 71 y.o. female here for DYSPHAGIA.   Past Medical History  Diagnosis Date  . COPD (chronic obstructive pulmonary disease)   . Anxiety disorder   . Hypothyroidism   . PUD (peptic ulcer disease)     57yrs  . Dysphagia   . IBS (irritable bowel syndrome)   . Diverticulosis   . Trigeminal neuralgia   . Diastolic dysfunction   . Depression   . Allergic rhinitis   . Adenocarcinoma of lung 12/2010    left lung/surg only    Past Surgical History  Procedure Date  . 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 Esophagogastroduodenoscopy 11/13/2009    w/dilation to 14mm, gastric ulceration (H.Pylori) s/p treatment  . Esophagogastroduodenoscopy 11/21/2009    distal esophageal web, gastritis  . Lung cancer surgery 12/2010    Left VATS, minithoracotomy, LLL superior segmentectomy  . Vocal cord surgery 02/06/2011    laryngoscopy with bilateral vocal cord Radiesse injection for vocal cord paralysis  . Abdominal hysterectomy   . Cholecystectomy   . Tubal ligation   . Cataract extraction   . Fatty tumor removal from lt groin     Prior to Admission medications   Medication Sig Start Date End Date Taking? Authorizing Provider  ALPRAZolam Prudy Feeler) 0.5 MG tablet Take 0.25 mg by mouth daily as needed. Anxiety   Yes Historical Provider, MD  calcium citrate-vitamin D (CITRACAL+D) 315-200 MG-UNIT per tablet Take 1 tablet by mouth daily.    Yes Historical Provider, MD  fish oil-omega-3 fatty acids 1000 MG capsule Take 1 g by mouth daily.    Yes Historical Provider, MD  levothyroxine (SYNTHROID, LEVOTHROID) 25 MCG tablet Take 25 mcg by mouth daily.     Yes Historical Provider, MD  pantoprazole (PROTONIX) 40 MG tablet Take 1 tablet (40 mg total) by mouth daily. 02/11/12 02/10/13 Yes Nira Retort, NP  pravastatin (PRAVACHOL)  20 MG tablet Take 20 mg by mouth daily.   Yes Historical Provider, MD    Allergies as of 02/29/2012 - Review Complete 02/26/2012  Allergen Reaction Noted  . Ciprofloxacin hcl Hives 07/23/2011  . Iohexol  10/31/2010  . Sulfonamide derivatives    . Prozac (fluoxetine hcl) Rash 05/11/2011    Family History  Problem Relation Age of Onset  . Liver cancer Sister   . Pulmonary embolism Mother   . Heart attack Father   . Hypertension Father     History   Social History  . Marital Status: Divorced    Spouse Name: N/A    Number of Children: N/A  . Years of Education: N/A   Occupational History  . Not on file.   Social History Main Topics  . Smoking status: Current Everyday Smoker -- 0.5 packs/day for 20 years    Types: Cigarettes  . Smokeless tobacco: Never Used   Comment: smoking cessation info given and reviewed   . Alcohol Use: Yes     occasional  . Drug Use: No  . Sexually Active: Not on file   Other Topics Concern  . Not on file   Social History Narrative  . No narrative on file    Review of Systems: See HPI, otherwise negative ROS   Physical Exam: BP 113/71  Pulse 67  Temp(Src) 97.4 F (36.3 C) (Oral)  Resp 20  Ht 5' 5.5" (  1.664 m)  Wt 123 lb (55.792 kg)  BMI 20.16 kg/m2  SpO2 93% General:   Alert,  pleasant and cooperative in NAD Head:  Normocephalic and atraumatic. Neck:  Supple;  Lungs:  Clear throughout to auscultation.    Heart:  Regular rate and rhythm. Abdomen:  Soft, nontender and nondistended. Normal bowel sounds, without guarding, and without rebound.   Neurologic:  Alert and  oriented x4;  grossly normal neurologically.  Impression/Plan:     DYSPHAGIA  PLAN:  EGD/DIL TODAY

## 2012-03-18 ENCOUNTER — Telehealth: Payer: Self-pay | Admitting: Gastroenterology

## 2012-03-18 NOTE — Telephone Encounter (Signed)
CALL PT. She has STILL HAS H. Pylori gastritis. She has FAILED THERAPY WITH THE OTHER ABX REGIMEN. SHE needs PYLERA 3 PILLS QID FOR 10 DAYS, #QS, RFX0. She should take PROTONIX BID for 10 days the once daily. The meds cAn cause nausea, vomiting, abd cramps, loose stools, black colored stools, and metallic taste in her mouth. THE PYLERA CAN BE EXPENSIVE, BUT IT IS THE ONLY DRUG THAT WILL CLEAR UP HER INFECTION.  OPV IN 3 MOS 3 30 DYSPHAGIA/H PYLORI.

## 2012-03-18 NOTE — Telephone Encounter (Signed)
Pt will have pharmacist fax over request for refill.

## 2012-03-18 NOTE — Telephone Encounter (Signed)
Results Cc to PCP & f/u opv is in the computer 

## 2012-03-18 NOTE — Telephone Encounter (Signed)
Reminder in epic to follow up with SF in 3 months in E30 

## 2012-03-18 NOTE — Telephone Encounter (Signed)
Called and informed pt. Called Rx to [place] at [facility].

## 2012-03-18 NOTE — Telephone Encounter (Signed)
Pt called to see if her results were back and she also had questions about if she was suppose to continue with protonix. If so, she is out and asked for samples. Pt can be reached at [phone]

## 2012-03-18 NOTE — Telephone Encounter (Signed)
Reminder in epic to follow up in 3 months in E30 with SF/dysphagia/H Pylori

## 2012-03-19 ENCOUNTER — Telehealth: Payer: Self-pay | Admitting: Gastroenterology

## 2012-03-19 MED ORDER — BISMUTH SUBSALICYLATE 262 MG PO CHEW: CHEWABLE_TABLET | ORAL | Status: AC

## 2012-03-19 MED ORDER — TETRACYCLINE HCL 500 MG PO CAPS: ORAL_CAPSULE | ORAL | Status: AC

## 2012-03-19 MED ORDER — METRONIDAZOLE 500 MG PO TABS: ORAL_TABLET | ORAL | Status: AC

## 2012-03-19 NOTE — Telephone Encounter (Signed)
Patient stating that her insurance does NOT pay for Pyleria and she cant afford it so what else can she do please advise?

## 2012-03-19 NOTE — Telephone Encounter (Signed)
Called pt and told her the Rx's have been sent to [facility]. Call if problems.

## 2012-03-19 NOTE — Telephone Encounter (Signed)
PLEASE CALL PT. SHE NEEDS THE ABX CONTAINED IN THE PYLERA. THERE ARE NO OTHER ALTERNATIVES. SHE WAS TREATED WITH AMOXICILLIN AND BIAXIN AND FAILED TO GET RID OF THE BACTERIA. SHE NEEDS TO GET THE REGIMEN FROM [facility]. THEY CAN DISPENSE THE COMPONENTS INDIVIDUALLY AND IT SHOULD BE CHEAPER. THEY ALSO DELIVER.  SHE WILL NEED TO TAKE PROTONIX TWICE DAILY FOR 14 DAYS WHILE TAKING THE ABX. IT IS IMPORTANT THAT SHE TAKES ALL THE ABX AS PRESCRIBED. SHE SHOULD CALL WITH QUESTIONS OR CONCERNS.  QUADRUPLE rX PER UP TO DATE GUIDELINES.

## 2012-03-25 ENCOUNTER — Telehealth: Payer: Self-pay | Admitting: Gastroenterology

## 2012-03-25 NOTE — Telephone Encounter (Signed)
Spoke to pharmacist at [facility], he said pt was given the 500 mg Flagyl . It was their error, they typed only one daily. Pt is aware to take one tab bid.

## 2012-03-25 NOTE — Telephone Encounter (Signed)
Pt called this morning about her medication. She said the math doesn't add up. She has 28 pills and says take one a day for 14 days. She wants to clarify if she should be take two a day. Please call her at [phone]

## 2012-03-25 NOTE — Telephone Encounter (Signed)
REVIEWED.  

## 2012-03-25 NOTE — Telephone Encounter (Signed)
Called pt. She said it just dawned on her. The Flagyl bottle said one daily for 14 days and she has 28 pills. I told her it should be bid x 14 days. She started it on Friday and has only been taking one tablet daily.

## 2012-04-03 ENCOUNTER — Telehealth: Payer: Self-pay | Admitting: Gastroenterology

## 2012-04-03 MED ORDER — PANTOPRAZOLE SODIUM 40 MG PO TBEC: 40.0000 mg | DELAYED_RELEASE_TABLET | Freq: Every day | ORAL | Status: DC

## 2012-04-03 NOTE — Telephone Encounter (Signed)
I wrote for once daily. Twice daily only while on hpylori treatment. Please fax rx as I could not find [facility] in epic.

## 2012-04-03 NOTE — Telephone Encounter (Signed)
Patient called about getting more RF thru [facility] mail order. She is needing more protonix. She also had other questions regarding her other medications about h pylori. Please advise and call her back at [phone] or [phone]

## 2012-04-03 NOTE — Telephone Encounter (Signed)
Faxed prescription to [facility].

## 2012-04-03 NOTE — Addendum Note (Signed)
Addended by: Tiffany Kocher on: 04/03/2012 04:08 PM   Modules accepted: Orders

## 2012-04-03 NOTE — Telephone Encounter (Signed)
Pt needs the prescription for Protonix faxed to the [facility] Order at [phone].  ( she said she is completing the meds for H. Pylori.

## 2012-04-16 NOTE — Progress Notes (Signed)
EGD/DIL MAY 2013 H PYLORI GASTRITIS FAILED ABO RX -->PYLERA COMPONENTS FOR 10 DAYS

## 2012-06-16 ENCOUNTER — Encounter: Payer: Self-pay | Admitting: Gastroenterology

## 2012-07-28 ENCOUNTER — Encounter (HOSPITAL_COMMUNITY): Payer: Medicare Other | Attending: Oncology

## 2012-07-28 DIAGNOSIS — C3430 Malignant neoplasm of lower lobe, unspecified bronchus or lung: Secondary | ICD-10-CM

## 2012-07-28 DIAGNOSIS — J4489 Other specified chronic obstructive pulmonary disease: Secondary | ICD-10-CM | POA: Insufficient documentation

## 2012-07-28 DIAGNOSIS — C3490 Malignant neoplasm of unspecified part of unspecified bronchus or lung: Secondary | ICD-10-CM

## 2012-07-28 DIAGNOSIS — R911 Solitary pulmonary nodule: Secondary | ICD-10-CM | POA: Insufficient documentation

## 2012-07-28 DIAGNOSIS — Z85118 Personal history of other malignant neoplasm of bronchus and lung: Secondary | ICD-10-CM | POA: Insufficient documentation

## 2012-07-28 DIAGNOSIS — J449 Chronic obstructive pulmonary disease, unspecified: Secondary | ICD-10-CM | POA: Insufficient documentation

## 2012-07-28 DIAGNOSIS — Z09 Encounter for follow-up examination after completed treatment for conditions other than malignant neoplasm: Secondary | ICD-10-CM | POA: Insufficient documentation

## 2012-07-28 LAB — CBC
HCT: 42.8 % (ref 36.0–46.0)
Hemoglobin: 14.8 g/dL (ref 12.0–15.0)
MCH: 30.6 pg (ref 26.0–34.0)
MCHC: 34.6 g/dL (ref 30.0–36.0)
MCV: 88.4 fL (ref 78.0–100.0)
Platelets: 318 10*3/uL (ref 150–400)
RBC: 4.84 MIL/uL (ref 3.87–5.11)
RDW: 13.4 % (ref 11.5–15.5)
WBC: 8.4 10*3/uL (ref 4.0–10.5)

## 2012-07-28 LAB — COMPREHENSIVE METABOLIC PANEL
ALT: 11 U/L (ref 0–35)
AST: 15 U/L (ref 0–37)
Albumin: 4.1 g/dL (ref 3.5–5.2)
Alkaline Phosphatase: 64 U/L (ref 39–117)
BUN: 10 mg/dL (ref 6–23)
CO2: 28 mEq/L (ref 19–32)
Calcium: 9.6 mg/dL (ref 8.4–10.5)
Chloride: 97 mEq/L (ref 96–112)
Creatinine, Ser: 0.63 mg/dL (ref 0.50–1.10)
GFR calc Af Amer: >90 mL/min (ref >90)
GFR calc non Af Amer: 89 mL/min — ABNORMAL LOW (ref >90)
Glucose, Bld: 102 mg/dL — ABNORMAL HIGH (ref 70–99)
Potassium: 4.2 mEq/L (ref 3.5–5.1)
Sodium: 136 mEq/L (ref 135–145)
Total Bilirubin: 0.4 mg/dL (ref 0.3–1.2)
Total Protein: 7 g/dL (ref 6.0–8.3)

## 2012-07-28 NOTE — Progress Notes (Signed)
Labs drawn today for cbc,cmp 

## 2012-07-29 ENCOUNTER — Ambulatory Visit (HOSPITAL_COMMUNITY)
Admission: RE | Admit: 2012-07-29 | Discharge: 2012-07-29 | Disposition: A | Discharge status: Home / Self Care | Payer: Medicare Other | Source: Ambulatory Visit | Attending: Oncology | Admitting: Oncology

## 2012-07-29 DIAGNOSIS — J449 Chronic obstructive pulmonary disease, unspecified: Secondary | ICD-10-CM | POA: Insufficient documentation

## 2012-07-29 DIAGNOSIS — J4489 Other specified chronic obstructive pulmonary disease: Secondary | ICD-10-CM | POA: Insufficient documentation

## 2012-07-29 DIAGNOSIS — C3490 Malignant neoplasm of unspecified part of unspecified bronchus or lung: Secondary | ICD-10-CM | POA: Insufficient documentation

## 2012-07-29 DIAGNOSIS — I7090 Unspecified atherosclerosis: Secondary | ICD-10-CM | POA: Insufficient documentation

## 2012-08-01 ENCOUNTER — Encounter (HOSPITAL_BASED_OUTPATIENT_CLINIC_OR_DEPARTMENT_OTHER): Payer: Medicare Other | Admitting: Oncology

## 2012-08-01 ENCOUNTER — Encounter (HOSPITAL_COMMUNITY): Payer: Self-pay | Admitting: Oncology

## 2012-08-01 VITALS — BP 119/70 | HR 64 | Temp 97.4°F | Resp 18 | Wt 122.8 lb

## 2012-08-01 DIAGNOSIS — R911 Solitary pulmonary nodule: Secondary | ICD-10-CM

## 2012-08-01 DIAGNOSIS — C3490 Malignant neoplasm of unspecified part of unspecified bronchus or lung: Secondary | ICD-10-CM

## 2012-08-01 DIAGNOSIS — C3430 Malignant neoplasm of lower lobe, unspecified bronchus or lung: Secondary | ICD-10-CM

## 2012-08-01 NOTE — Patient Instructions (Addendum)
[facility]  Discharge Instructions  RECOMMENDATIONS MADE BY THE CONSULTANT AND ANY TEST RESULTS WILL BE SENT TO YOUR REFERRING DOCTOR.   Return to clinic in 3 months for lab work, then CT scan, then to see MD. Report any issues/concerns to clinic prior to appointment if necessary.   I acknowledge that I have been informed and understand all the instructions given to me and received a copy. I do not have any more questions at this time, but understand that I may call the Specialty Clinic at [facility] at [phone] during business hours should I have any further questions or need assistance in obtaining follow-up care.    __________________________________________  _____________  __________ Signature of Patient or Authorized Representative            Date                   Time    __________________________________________ Nurse's Signature

## 2012-08-01 NOTE — Progress Notes (Signed)
Ruth Grosser, MD [address] [place] [address]  1. Adenocarcinoma of lung     CURRENT THERAPY: Observation  INTERVAL HISTORY: [name] 71 y.o. 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 Primary was 2.2 cm. No lymphovascular space invasion was noted. Margins were clear. No perineural space invasion was noted, and 14 nodes were negative.  [name] is here to review her most recent CT scan.  I personally reviewed and went over radiographic studies with the patient.  Unfortunately, her CT scan showed a new nonspecific 8 mm subpleural nodule that is new in the exterior aspect of the left lower lobe.  Therefore, she will require a CT scan in approximately 3 months time for close followup of this new nodule. I spent some time discussing this new diagnosis with the patient. She understands and 96% time lung nodules are benign, but with her strong history in this new finding, will require close attention on followup. I will schedule her for CT of the chest without contrast in 12 weeks time for followup of his pulmonary nodule.  I personally reviewed and went over laboratory results with the patient.  I reviewed the patient's laboratory work with her. Her laboratory work is truly unremarkable with a complete blood count within normal limits. Her complete metabolic panel is unremarkable as well.  She denies any complaints.  She denies any cough or hemoptysis.  She denies any pain.    Complete ROS questioning is negative.     Past Medical History  Diagnosis Date  . COPD (chronic obstructive pulmonary disease)   . Anxiety disorder   . Hypothyroidism   . PUD (peptic ulcer disease)     53yrs  . Dysphagia   . IBS (irritable bowel syndrome)   . Diverticulosis   . Trigeminal neuralgia   . Diastolic dysfunction   . Depression   . Allergic rhinitis     . 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     is allergic to ciprofloxacin hcl; iohexol; prozac; and sulfonamide derivatives.  [name] had no medications administered during this visit.  Past Surgical History  Procedure Date  . 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 Esophagogastroduodenoscopy 11/13/2009    w/dilation to 14mm, gastric ulceration (H.Pylori) s/p treatment  . Esophagogastroduodenoscopy 11/21/2009    distal esophageal web, gastritis  . Lung cancer surgery 12/2010    Left VATS, minithoracotomy, LLL superior segmentectomy  . Vocal cord surgery 02/06/2011    laryngoscopy with bilateral vocal cord Radiesse injection for vocal cord paralysis  . Abdominal hysterectomy   . Cholecystectomy   . Tubal ligation   . Cataract extraction   . Fatty tumor removal from lt groin     Denies any headaches, dizziness, double vision, fevers, chills, night sweats, nausea, vomiting, diarrhea, constipation, chest pain, heart palpitations, shortness of breath, blood in stool, black tarry stool, urinary pain, urinary burning, urinary frequency, hematuria.   PHYSICAL EXAMINATION  ECOG PERFORMANCE STATUS: 0 - Asymptomatic  Filed Vitals:   08/01/12 1000  BP: 119/70  Pulse: 64  Temp: 97.4 F (36.3 C)  Resp: 18    GENERAL:alert, no  distress, well nourished, well developed, comfortable, cooperative and smiling SKIN: skin color, texture, turgor are normal, no rashes or significant lesions HEAD: Normocephalic, No masses, lesions, tenderness or abnormalities EYES: normal, Conjunctiva are pink and non-injected EARS: External ears normal OROPHARYNX:lips, buccal mucosa, and tongue normal and mucous membranes are moist  NECK: supple, no  adenopathy, thyroid normal size, non-tender, without nodularity, no stridor, non-tender, trachea midline LYMPH:  no palpable lymphadenopathy BREAST:not examined LUNGS: clear to auscultation and percussion HEART: regular rate & rhythm, no murmurs, no gallops, S1 normal and S2 normal ABDOMEN:abdomen soft, non-tender and normal bowel sounds BACK: Back symmetric, no curvature., No CVA tenderness EXTREMITIES:less then 2 second capillary refill, no joint deformities, effusion, or inflammation, no edema, no skin discoloration, no clubbing, no cyanosis  NEURO: alert & oriented x 3 with fluent speech, no focal motor/sensory deficits, gait normal   LABORATORY DATA: CBC    Component Value Date/Time   WBC 8.4 07/28/2012 0844   RBC 4.84 07/28/2012 0844   HGB 14.8 07/28/2012 0844   HCT 42.8 07/28/2012 0844   PLT 318 07/28/2012 0844   MCV 88.4 07/28/2012 0844   MCH 30.6 07/28/2012 0844   MCHC 34.6 07/28/2012 0844   RDW 13.4 07/28/2012 0844   LYMPHSABS 1.3 01/23/2012 0918   MONOABS 0.5 01/23/2012 0918   EOSABS 0.2 01/23/2012 0918   BASOSABS 0.1 01/23/2012 0918      Chemistry      Component Value Date/Time   NA 136 07/28/2012 0844   K 4.2 07/28/2012 0844   CL 97 07/28/2012 0844   CO2 28 07/28/2012 0844   BUN 10 07/28/2012 0844   CREATININE 0.63 07/28/2012 0844      Component Value Date/Time   CALCIUM 9.6 07/28/2012 0844   ALKPHOS 64 07/28/2012 0844   AST 15 07/28/2012 0844   ALT 11 07/28/2012 0844   BILITOT 0.4 07/28/2012 0844       RADIOGRAPHIC STUDIES:  07/29/2012  *RADIOLOGY REPORT*  Clinical Data: History of lung adenocarcinoma status post surgical  resection. Follow-up study.  CT CHEST WITHOUT CONTRAST  Technique: Multidetector CT imaging of the chest was performed  following the standard protocol without IV contrast.  Comparison: Chest CT 01/24/2012.  Findings:  Mediastinum: Heart size is normal. A small amount of pericardial  fluid and/or thickening, similar to prior  examination, unlikely to  be of hemodynamic significance at this time. No associated  pericardial calcification. There is atherosclerosis of the thoracic  aorta, the great vessels of the mediastinum and the coronary  arteries, including calcified atherosclerotic plaque in the the  left anterior descending coronary artery. No pathologically  enlarged mediastinal or hilar lymph nodes. Please note that  accurate exclusion of hilar adenopathy is limited on noncontrast CT  scans. Esophagus is unremarkable in appearance.  Lungs/Pleura: Postoperative changes of wedge resection are noted in  the superior segment of the left lower lobe. There is an area of  linear scarring in the posteromedial aspect of the left lower lobe  that is similar to the prior examination. However, there is a new  8 mm nodule along this region that is subpleural in location (image  39 of series 3). Along the suture line itself there is no definite  focal nodularity. 3 mm nodule in the medial aspect of the left  lower lobe (image 50 of series 3) is highly nonspecific, and is  similar in retrospect compared to prior examinations. No acute  consolidative airspace disease. No pleural effusions. Scarring in  the inferior segment of the lingula and lateral segment of the  right middle lobe. A background of mild diffuse bronchial wall  thickening with moderate centrilobular and mild paraseptal  emphysema.  Upper Abdomen: Atherosclerosis.  Musculoskeletal: There are no aggressive appearing lytic or blastic  lesions noted in the visualized portions of the skeleton. Old  compression fracture at T8 with approximately 50% loss of anterior  vertebral body height is unchanged.  IMPRESSION:  1. Interval development of an 8 mm subpleural nodule in the  posterior aspect of the left lower lobe. This is nonspecific,  however metastatic disease or a second primary lesion is not  excluded and close attention on follow-up studies is  recommended.  At this time, a follow-up noncontrast chest CT in 3 months is  recommended.  2. Atherosclerosis, including left anterior descending coronary  artery disease. Assessment for potential risk factor modification,  dietary therapy or pharmacologic therapy may be warranted, if  clinically indicated.  3. Diffuse bronchial wall thickening with moderate centrilobular  and mild paraseptal emphysema; findings compatible with underlying  COPD.  4. Additional incidental findings, as above.  These results were called by telephone on 07/29/2012 at 09:33 a.m.  to Dr. Mariel Sleet, who verbally acknowledged these results.  Original Report Authenticated By: Florencia Reasons, M.D.     ASSESSMENT:  1. 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 Primary was 2.2 cm. No lymphovascular space invasion was noted. Margins were clear. No perineural space invasion was noted, and 14 nodes were negative.   PLAN:  1. I personally reviewed and went over laboratory results with the patient. 2. I personally reviewed and went over radiographic studies with the patient. 3. CT scan of chest without contrast in 12 weeks to follow-up on new pulmonary nodule.  4. Lab work in 3 months: CBC diff, CMET 5. Return in 3 months following CT scan for follow-up    All questions were answered. The patient knows to call the clinic with any problems, questions or concerns. We can certainly see the patient much sooner if necessary.  The patient and plan discussed with Glenford Peers, MD and he is in agreement with the aforementioned.  Jasmin Winberry

## 2012-08-11 ENCOUNTER — Other Ambulatory Visit: Payer: Self-pay | Admitting: Family Medicine

## 2012-08-11 DIAGNOSIS — M810 Age-related osteoporosis without current pathological fracture: Secondary | ICD-10-CM

## 2012-08-18 ENCOUNTER — Ambulatory Visit (HOSPITAL_COMMUNITY)
Admission: RE | Admit: 2012-08-18 | Discharge: 2012-08-18 | Disposition: A | Discharge status: Home / Self Care | Payer: Medicare Other | Source: Ambulatory Visit | Attending: Family Medicine | Admitting: Family Medicine

## 2012-08-18 DIAGNOSIS — J449 Chronic obstructive pulmonary disease, unspecified: Secondary | ICD-10-CM | POA: Insufficient documentation

## 2012-08-18 DIAGNOSIS — J4489 Other specified chronic obstructive pulmonary disease: Secondary | ICD-10-CM | POA: Insufficient documentation

## 2012-08-18 DIAGNOSIS — Z85118 Personal history of other malignant neoplasm of bronchus and lung: Secondary | ICD-10-CM | POA: Insufficient documentation

## 2012-08-18 DIAGNOSIS — M810 Age-related osteoporosis without current pathological fracture: Secondary | ICD-10-CM | POA: Insufficient documentation

## 2012-08-26 ENCOUNTER — Encounter (HOSPITAL_COMMUNITY): Payer: Self-pay | Admitting: Pharmacy Technician

## 2012-08-28 ENCOUNTER — Encounter (HOSPITAL_COMMUNITY)
Admission: RE | Admit: 2012-08-28 | Discharge: 2012-08-28 | Disposition: A | Discharge status: Home / Self Care | Payer: Medicare Other | Source: Ambulatory Visit | Attending: Ophthalmology | Admitting: Ophthalmology

## 2012-08-28 ENCOUNTER — Encounter (HOSPITAL_COMMUNITY): Payer: Self-pay

## 2012-08-28 LAB — BASIC METABOLIC PANEL
BUN: 8 mg/dL (ref 6–23)
CO2: 33 mEq/L — ABNORMAL HIGH (ref 19–32)
Calcium: 10.7 mg/dL — ABNORMAL HIGH (ref 8.4–10.5)
Chloride: 97 mEq/L (ref 96–112)
Creatinine, Ser: 0.66 mg/dL (ref 0.50–1.10)
GFR calc Af Amer: >90 mL/min (ref >90)
GFR calc non Af Amer: 87 mL/min — ABNORMAL LOW (ref >90)
Glucose, Bld: 69 mg/dL — ABNORMAL LOW (ref 70–99)
Potassium: 4.5 mEq/L (ref 3.5–5.1)
Sodium: 135 mEq/L (ref 135–145)

## 2012-08-28 LAB — HEMOGLOBIN AND HEMATOCRIT, BLOOD
HCT: 42.7 % (ref 36.0–46.0)
Hemoglobin: 14.6 g/dL (ref 12.0–15.0)

## 2012-08-28 MED ORDER — CYCLOPENTOLATE-PHENYLEPHRINE 0.2-1 % OP SOLN: 1.0000 [drp] | OPHTHALMIC | Status: DC

## 2012-08-28 MED ORDER — LIDOCAINE HCL 3.5 % OP GEL: 1.0000 "application " | Freq: Once | OPHTHALMIC | Status: DC

## 2012-08-28 MED ORDER — TETRACAINE HCL 0.5 % OP SOLN: 1.0000 [drp] | OPHTHALMIC | Status: DC

## 2012-08-28 MED ORDER — PHENYLEPHRINE HCL 2.5 % OP SOLN: 1.0000 [drp] | OPHTHALMIC | Status: DC

## 2012-08-28 NOTE — Patient Instructions (Addendum)
Your procedure is scheduled on:  09/02/2012  Report to [facility] at  7:30    AM.  Call this number if you have problems the morning of surgery: [phone]   Remember:   Do not eat or drink :After Midnight.    Take these medicines the morning of surgery with A SIP OF WATER: Levothyroxine   Do not wear jewelry, make-up or nail polish.  Do not wear lotions, powders, or perfumes. You may wear deodorant.  Do not shave 48 hours prior to surgery.  Do not bring valuables to the hospital.  Contacts, dentures or bridgework may not be worn into surgery.  Patients discharged the day of surgery will not be allowed to drive home.  Name and phone number of your driver:    Please read over the following fact sheets that you were given: Pain Booklet, Surgical Site Infection Prevention, Anesthesia Post-op Instructions and Care and Recovery After Surgery  Cataract Surgery  A cataract is a clouding of the lens of the eye. When a lens becomes cloudy, vision is reduced based on the degree and nature of the clouding. Surgery may be needed to improve vision. Surgery removes the cloudy lens and usually replaces it with a substitute lens (intraocular lens, IOL). LET YOUR EYE DOCTOR KNOW ABOUT:  Allergies to food or medicine.   Medicines taken including herbs, eyedrops, over-the-counter medicines, and creams.   Use of steroids (by mouth or creams).   Previous problems with anesthetics or numbing medicine.   History of bleeding problems or blood clots.   Previous surgery.   Other health problems, including diabetes and kidney problems.   Possibility of pregnancy, if this applies.  RISKS AND COMPLICATIONS  Infection.   Inflammation of the eyeball (endophthalmitis) that can spread to both eyes (sympathetic ophthalmia).   Poor wound healing.   If an IOL is inserted, it can later fall out of proper position. This is very uncommon.   Clouding of the part of your eye that holds an IOL in place.  This is called an "after-cataract." These are uncommon, but easily treated.  BEFORE THE PROCEDURE  Do not eat or drink anything except small amounts of water for 8 to 12 before your surgery, or as directed by your caregiver.   Unless you are told otherwise, continue any eyedrops you have been prescribed.   Talk to your primary caregiver about all other medicines that you take (both prescription and non-prescription). In some cases, you may need to stop or change medicines near the time of your surgery. This is most important if you are taking blood-thinning medicine.Do not stop medicines unless you are told to do so.   Arrange for someone to drive you to and from the procedure.   Do not put contact lenses in either eye on the day of your surgery.  PROCEDURE There is more than one method for safely removing a cataract. Your doctor can explain the differences and help determine which is best for you. Phacoemulsification surgery is the most common form of cataract surgery.  An injection is given behind the eye or eyedrops are given to make this a painless procedure.   A small cut (incision) is made on the edge of the clear, dome-shaped surface that covers the front of the eye (cornea).   A tiny probe is painlessly inserted into the eye. This device gives off ultrasound waves that soften and break up the cloudy center of the lens. This makes it easier for  the cloudy lens to be removed by suction.   An IOL may be implanted.   The normal lens of the eye is covered by a clear capsule. Part of that capsule is intentionally left in the eye to support the IOL.   Your surgeon may or may not use stitches to close the incision.  There are other forms of cataract surgery that require a larger incision and stiches to close the eye. This approach is taken in cases where the doctor feels that the cataract cannot be easily removed using phacoemulsification. AFTER THE PROCEDURE  When an IOL is  implanted, it does not need care. It becomes a permanent part of your eye and cannot be seen or felt.   Your doctor will schedule follow-up exams to check on your progress.   Review your other medicines with your doctor to see which can be resumed after surgery.   Use eyedrops or take medicine as prescribed by your doctor.  Document Released: 09/13/2011 Document Reviewed: 09/10/2011 [facility] Patient Information 2012 [place], [place].  .Cataract Surgery Care After Refer to this sheet in the next few weeks. These instructions provide you with information on caring for yourself after your procedure. Your caregiver may also give you more specific instructions. Your treatment has been planned according to current medical practices, but problems sometimes occur. Call your caregiver if you have any problems or questions after your procedure.  HOME CARE INSTRUCTIONS   Avoid strenuous activities as directed by your caregiver.   Ask your caregiver when you can resume driving.   Use eyedrops or other medicines to help healing and control pressure inside your eye as directed by your caregiver.   Only take over-the-counter or prescription medicines for pain, discomfort, or fever as directed by your caregiver.   Do not to touch or rub your eyes.   You may be instructed to use a protective shield during the first few days and nights after surgery. If not, wear sunglasses to protect your eyes. This is to protect the eye from pressure or from being accidentally bumped.   Keep the area around your eye clean and dry. Avoid swimming or allowing water to hit you directly in the face while showering. Keep soap and shampoo out of your eyes.   Do not bend or lift heavy objects. Bending increases pressure in the eye. You can walk, climb stairs, and do light household chores.   Do not put a contact lens into the eye that had surgery until your caregiver says it is okay to do so.   Ask your doctor when you can  return to work. This will depend on the kind of work that you do. If you work in a dusty environment, you may be advised to wear protective eyewear for a period of time.   Ask your caregiver when it will be safe to engage in sexual activity.   Continue with your regular eye exams as directed by your caregiver.  What to expect:  It is normal to feel itching and mild discomfort for a few days after cataract surgery. Some fluid discharge is also common, and your eye may be sensitive to light and touch.   After 1 to 2 days, even moderate discomfort should disappear. In most cases, healing will take about 6 weeks.   If you received an intraocular lens (IOL), you may notice that colors are very bright or have a blue tinge. Also, if you have been in bright sunlight, everything may appear  reddish for a few hours. If you see these color tinges, it is because your lens is clear and no longer cloudy. Within a few months after receiving an IOL, these extra colors should go away. When you have healed, you will probably need new glasses.  SEEK MEDICAL CARE IF:   You have increased bruising around your eye.   You have discomfort not helped by medicine.  SEEK IMMEDIATE MEDICAL CARE IF:   You have a fever.   You have a worsening or sudden vision loss.   You have redness, swelling, or increasing pain in the eye.   You have a thick discharge from the eye that had surgery.  MAKE SURE YOU:  Understand these instructions.   Will watch your condition.   Will get help right away if you are not doing well or get worse.  Document Released: 04/13/2005 Document Revised: 09/13/2011 Document Reviewed: 05/18/2011 [facility] Patient Information 2012 [place], [place].

## 2012-09-01 MED ORDER — TETRACAINE HCL 0.5 % OP SOLN
OPHTHALMIC | Status: AC
  Filled 2012-09-01: qty 2

## 2012-09-01 MED ORDER — FLURBIPROFEN SODIUM 0.03 % OP SOLN
OPHTHALMIC | Status: AC
  Filled 2012-09-01: qty 2.5

## 2012-09-01 MED ORDER — CYCLOPENTOLATE-PHENYLEPHRINE 0.2-1 % OP SOLN
OPHTHALMIC | Status: AC
  Filled 2012-09-01: qty 2

## 2012-09-01 MED ORDER — PHENYLEPHRINE HCL 2.5 % OP SOLN
OPHTHALMIC | Status: AC
  Filled 2012-09-01: qty 2

## 2012-09-02 ENCOUNTER — Encounter (HOSPITAL_COMMUNITY)
Admission: RE | Disposition: A | Discharge status: Home / Self Care | Payer: Self-pay | Source: Ambulatory Visit | Attending: Ophthalmology

## 2012-09-02 ENCOUNTER — Ambulatory Visit (HOSPITAL_COMMUNITY)
Admission: RE | Admit: 2012-09-02 | Discharge: 2012-09-02 | Disposition: A | Discharge status: Home / Self Care | Payer: Medicare Other | Source: Ambulatory Visit | Attending: Ophthalmology | Admitting: Ophthalmology

## 2012-09-02 ENCOUNTER — Encounter (HOSPITAL_COMMUNITY): Payer: Self-pay | Admitting: Anesthesiology

## 2012-09-02 ENCOUNTER — Ambulatory Visit (HOSPITAL_COMMUNITY): Payer: Medicare Other | Admitting: Anesthesiology

## 2012-09-02 ENCOUNTER — Encounter (HOSPITAL_COMMUNITY): Payer: Self-pay | Admitting: *Deleted

## 2012-09-02 DIAGNOSIS — F17200 Nicotine dependence, unspecified, uncomplicated: Secondary | ICD-10-CM | POA: Insufficient documentation

## 2012-09-02 DIAGNOSIS — Z01810 Encounter for preprocedural cardiovascular examination: Secondary | ICD-10-CM | POA: Insufficient documentation

## 2012-09-02 DIAGNOSIS — H2510 Age-related nuclear cataract, unspecified eye: Secondary | ICD-10-CM | POA: Insufficient documentation

## 2012-09-02 DIAGNOSIS — J449 Chronic obstructive pulmonary disease, unspecified: Secondary | ICD-10-CM | POA: Insufficient documentation

## 2012-09-02 DIAGNOSIS — Z01812 Encounter for preprocedural laboratory examination: Secondary | ICD-10-CM | POA: Insufficient documentation

## 2012-09-02 DIAGNOSIS — J4489 Other specified chronic obstructive pulmonary disease: Secondary | ICD-10-CM | POA: Insufficient documentation

## 2012-09-02 HISTORY — PX: CATARACT EXTRACTION W/PHACO: SHX586

## 2012-09-02 SURGERY — PHACOEMULSIFICATION, CATARACT, WITH IOL INSERTION
Anesthesia: Monitor Anesthesia Care | Site: Eye | Laterality: Left | Wound class: Clean

## 2012-09-02 MED ORDER — FLURBIPROFEN SODIUM 0.03 % OP SOLN
1.0000 [drp] | OPHTHALMIC | Status: AC
  Administered 2012-09-02 (×3): 1 [drp] via OPHTHALMIC

## 2012-09-02 MED ORDER — CYCLOPENTOLATE-PHENYLEPHRINE 0.2-1 % OP SOLN
1.0000 [drp] | OPHTHALMIC | Status: AC
  Administered 2012-09-02 (×3): 1 [drp] via OPHTHALMIC

## 2012-09-02 MED ORDER — EPINEPHRINE HCL 1 MG/ML IJ SOLN
INTRAOCULAR | Status: DC | PRN
  Administered 2012-09-02: 09:00:00

## 2012-09-02 MED ORDER — PHENYLEPHRINE HCL 2.5 % OP SOLN
1.0000 [drp] | OPHTHALMIC | Status: AC
  Administered 2012-09-02 (×3): 1 [drp] via OPHTHALMIC

## 2012-09-02 MED ORDER — MIDAZOLAM HCL 2 MG/2ML IJ SOLN
INTRAMUSCULAR | Status: AC
  Filled 2012-09-02: qty 2

## 2012-09-02 MED ORDER — MIDAZOLAM HCL 2 MG/2ML IJ SOLN
1.0000 mg | INTRAMUSCULAR | Status: DC | PRN
  Administered 2012-09-02: 2 mg via INTRAVENOUS

## 2012-09-02 MED ORDER — LACTATED RINGERS IV SOLN
INTRAVENOUS | Status: DC
  Administered 2012-09-02: 08:00:00 via INTRAVENOUS

## 2012-09-02 MED ORDER — PROVISC 10 MG/ML IO SOLN
INTRAOCULAR | Status: DC | PRN
  Administered 2012-09-02: 8.5 mg via INTRAOCULAR

## 2012-09-02 MED ORDER — TETRACAINE HCL 0.5 % OP SOLN
1.0000 [drp] | OPHTHALMIC | Status: AC
  Administered 2012-09-02 (×3): 1 [drp] via OPHTHALMIC

## 2012-09-02 MED ORDER — BSS IO SOLN
INTRAOCULAR | Status: DC | PRN
  Administered 2012-09-02: 15 mL via INTRAOCULAR

## 2012-09-02 SURGICAL SUPPLY — 23 items
CAPSULAR TENSION RING-AMO (OPHTHALMIC RELATED) IMPLANT
CLOTH BEACON ORANGE TIMEOUT ST (SAFETY) ×1 IMPLANT
EYE SHIELD UNIVERSAL CLEAR (GAUZE/BANDAGES/DRESSINGS) ×1 IMPLANT
GLOVE BIO SURGEON STRL SZ 6.5 (GLOVE) ×1 IMPLANT
GLOVE ECLIPSE 6.5 STRL STRAW (GLOVE) IMPLANT
GLOVE ECLIPSE 7.0 STRL STRAW (GLOVE) IMPLANT
GLOVE EXAM NITRILE LRG STRL (GLOVE) IMPLANT
GLOVE EXAM NITRILE MD LF STRL (GLOVE) ×1 IMPLANT
GLOVE SKINSENSE NS SZ6.5 (GLOVE)
GLOVE SKINSENSE STRL SZ6.5 (GLOVE) IMPLANT
HEALON 5 0.6 ML (INTRAOCULAR LENS) IMPLANT
KIT VITRECTOMY (OPHTHALMIC RELATED) IMPLANT
PAD ARMBOARD 7.5X6 YLW CONV (MISCELLANEOUS) ×1 IMPLANT
PROC W NO LENS (INTRAOCULAR LENS)
PROC W SPEC LENS (INTRAOCULAR LENS)
PROCESS W NO LENS (INTRAOCULAR LENS) IMPLANT
PROCESS W SPEC LENS (INTRAOCULAR LENS) IMPLANT
RING MALYGIN (MISCELLANEOUS) IMPLANT
SIGHTPATH CAT PROC W REG LENS (Ophthalmic Related) ×2 IMPLANT
TAPE SURG TRANSPORE 1 IN (GAUZE/BANDAGES/DRESSINGS) IMPLANT
TAPE SURGICAL TRANSPORE 1 IN (GAUZE/BANDAGES/DRESSINGS) ×1
VISCOELASTIC ADDITIONAL (OPHTHALMIC RELATED) IMPLANT
WATER STERILE IRR 250ML POUR (IV SOLUTION) ×1 IMPLANT

## 2012-09-02 NOTE — Anesthesia Postprocedure Evaluation (Signed)
  Anesthesia Post-op Note  Patient: [name]  Procedure(s) Performed: Procedure(s) (LRB) with comments: CATARACT EXTRACTION PHACO AND INTRAOCULAR LENS PLACEMENT (IOC) (Left) - CDE:10.35  Patient Location: PACU and Short Stay  Anesthesia Type:MAC  Level of Consciousness: awake, alert  and oriented  Airway and Oxygen Therapy: Patient Spontanous Breathing  Post-op Pain: none  Post-op Assessment: Post-op Vital signs reviewed, Patient's Cardiovascular Status Stable, Respiratory Function Stable, Patent Airway and No signs of Nausea or vomiting  Post-op Vital Signs: Reviewed and stable  Complications: No apparent anesthesia complications

## 2012-09-02 NOTE — H&P (Signed)
The patient was re examined and there is no change in the patients condition since the original H and P. 

## 2012-09-02 NOTE — Transfer of Care (Signed)
Immediate Anesthesia Transfer of Care Note  Patient: [name]  Procedure(s) Performed: Procedure(s) (LRB) with comments: CATARACT EXTRACTION PHACO AND INTRAOCULAR LENS PLACEMENT (IOC) (Left) - CDE:10.35  Patient Location: PACU and Short Stay  Anesthesia Type:MAC  Level of Consciousness: awake  Airway & Oxygen Therapy: Patient Spontanous Breathing  Post-op Assessment: Report given to PACU RN  Post vital signs: Reviewed  Complications: No apparent anesthesia complications

## 2012-09-02 NOTE — Op Note (Signed)
Patient brought to the operating room and prepped and draped in the usual manner.  Lid speculum inserted in left eye.  Stab incision made at the twelve o'clock position.  Provisc instilled in the anterior chamber.   A 2.4 mm. Stab incision was made temporally.  An anterior capsulotomy was done with a bent 25 gauge needle.  The nucleus was hydrodissected.  The Phaco tip was inserted in the anterior chamber and the nucleus was emulsified.  CDE was 10.35.  The cortical material was then removed with the I and A tip.  Posterior capsule was the polished.  The anterior chamber was deepened with Provisc.  A 20.0 Diopter Rayner 570C IOL was then inserted in the capsular bag.  Provisc was then removed with the I and A tip.  The wound was then hydrated.  Patient sent to the Recovery Room in good condition with follow up in my office.

## 2012-09-02 NOTE — Anesthesia Preprocedure Evaluation (Signed)
Anesthesia Evaluation  Patient identified by MRN, date of birth, ID band Patient awake    Reviewed: Allergy & Precautions, H&P , NPO status , Patient's Chart, lab work & pertinent test results  Airway Mallampati: II      Dental  (+) Teeth Intact   Pulmonary COPD (emphysema, Lung Cancer)Current Smoker,  breath sounds clear to auscultation        Cardiovascular Rhythm:Regular Rate:Normal     Neuro/Psych PSYCHIATRIC DISORDERS Depression  Neuromuscular disease    GI/Hepatic PUD, GERD-  Medicated,  Endo/Other  Hypothyroidism   Renal/GU      Musculoskeletal   Abdominal   Peds  Hematology   Anesthesia Other Findings   Reproductive/Obstetrics                           Anesthesia Physical Anesthesia Plan  ASA: III  Anesthesia Plan: MAC   Post-op Pain Management:    Induction: Intravenous  Airway Management Planned: Nasal Cannula  Additional Equipment:   Intra-op Plan:   Post-operative Plan:   Informed Consent: I have reviewed the patients History and Physical, chart, labs and discussed the procedure including the risks, benefits and alternatives for the proposed anesthesia with the patient or authorized representative who has indicated his/her understanding and acceptance.     Plan Discussed with:   Anesthesia Plan Comments:         Anesthesia Quick Evaluation

## 2012-09-03 ENCOUNTER — Encounter (HOSPITAL_COMMUNITY): Payer: Self-pay | Admitting: Ophthalmology

## 2012-09-03 IMAGING — CT CT ANGIO CHEST
3 of 6 series · 14 of 36 positions shown · IV contrast ([ID] OMNI [id number])
Comparison: None.

CLINICAL DATA: Shortness of breath.  Question PE.

CT ANGIOGRAPHY CHEST WITH CONTRAST
TECHNIQUE: Multidetector CT imaging of the chest was performed
using the standard protocol during bolus administration of
intravenous contrast.  Multiplanar CT image reconstructions
including MIPs were obtained to evaluate the vascular anatomy.

[Series 4: pe study (id) · axial · 0.66mm/px · z∈[-197,-47]mm · 3 of 122 slices shown]
[im 31/122  lung]
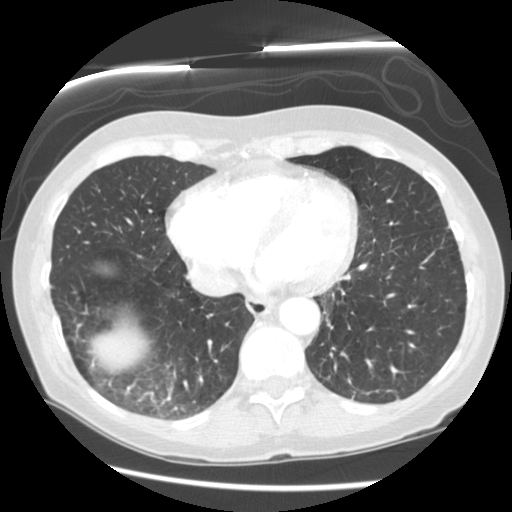
[im 61/122  lung]
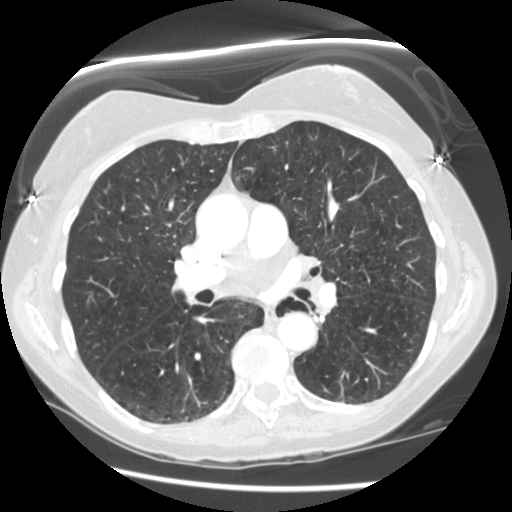
[im 91/122  lung]
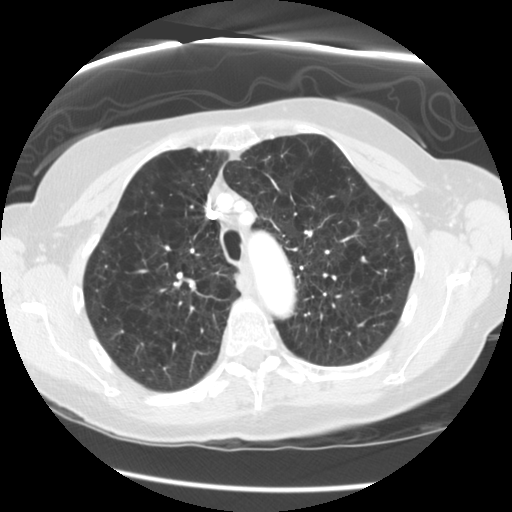

[Series 6: pe thin 1.25 · axial · 0.66mm/px · z∈[-240,-4]mm · 8 of 243 slices shown]
[im 27/243  lung]
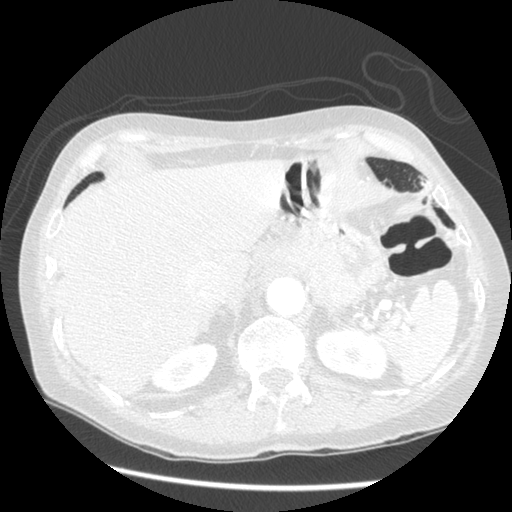
[im 54/243  mediastinal]
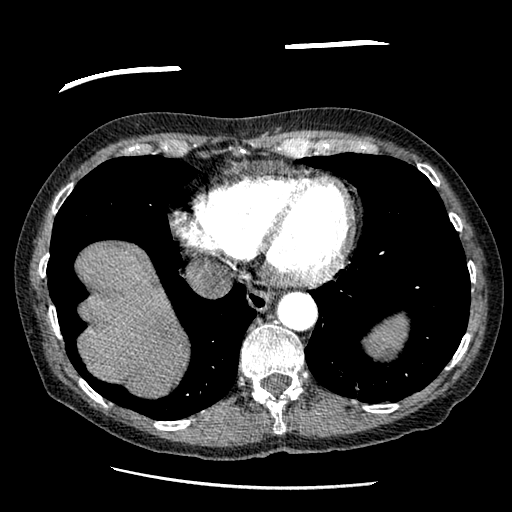
[im 81/243  lung]
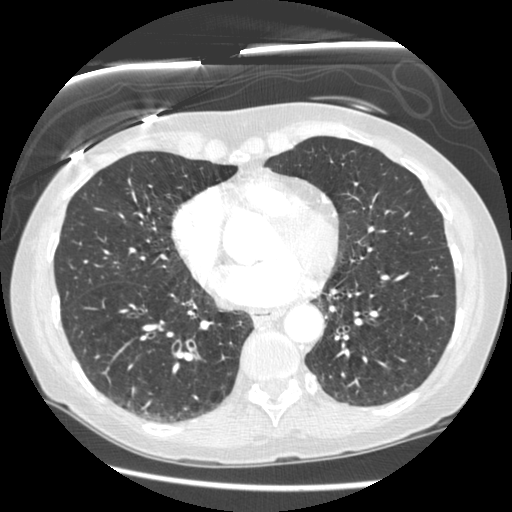
[im 108/243  mediastinal]
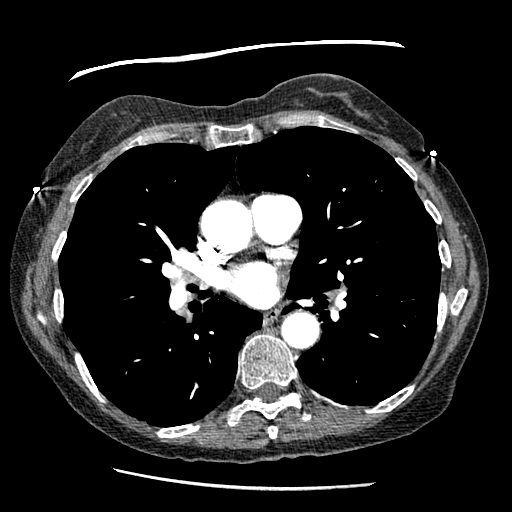
[im 135/243  lung]
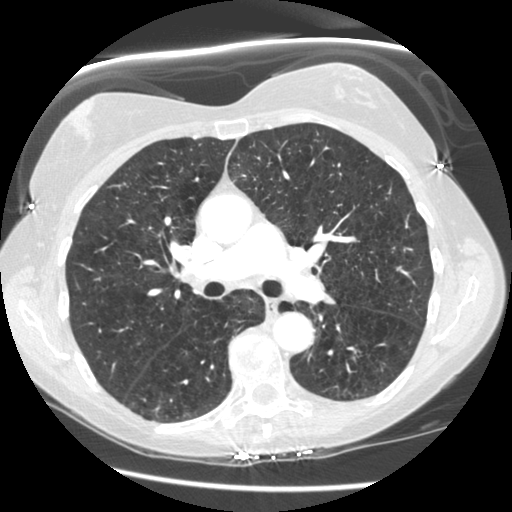
[im 162/243  mediastinal]
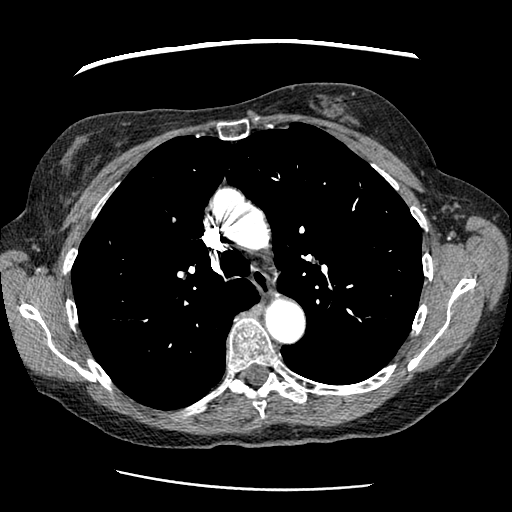
[im 189/243  lung]
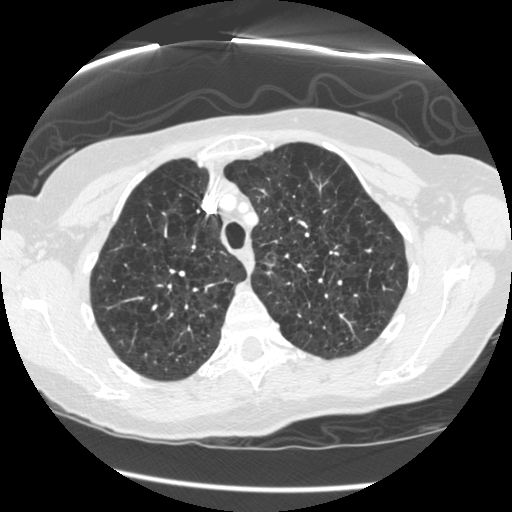
[im 216/243  mediastinal]
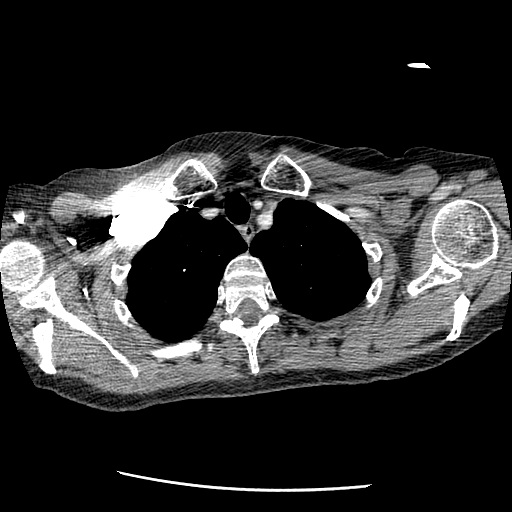

[Series 600: coronals · coronal · 0.66mm/px · 3 of 109 slices shown]
[im 22/109  mediastinal]
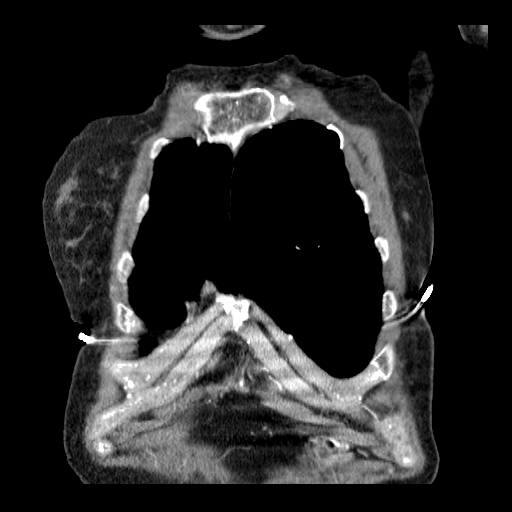
[im 44/109  mediastinal]
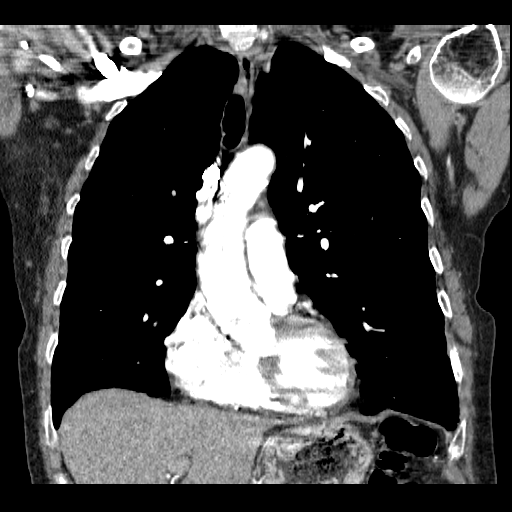
[im 65/109  mediastinal]
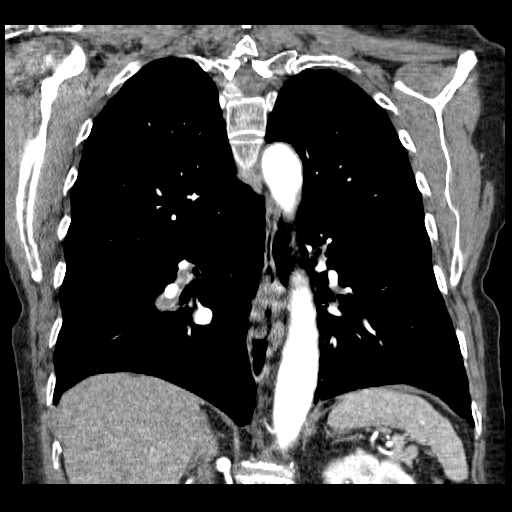

[14 of 36 positions shown; findings below may reference images not displayed]

Contrast:  100 ml Vmnipaque-2MM

Note:  The patient has a known contrast allergy.  The patient
received an appropriate 13 hour steroid premedication regimen and
the contrast bolus was well tolerated without complicating
features.

BUN and creatinine were obtained on site at [HOSPITAL] at
[HOSPITAL].
Results:  BUN 12 mg/dL,  Creatinine 0.5 mg/dL.
FINDINGS: There is no filling defect in the opacified pulmonary
arteries to suggest the presence of an acute pulmonary embolus.  No
thoracic aortic aneurysm.  There is no dissection of the thoracic
aorta.

No axillary lymphadenopathy.  No mediastinal lymphadenopathy.
There is a increased volume of lymphoid tissue in the hilar regions
bilaterally.

The heart size is normal.  There is trace anterior pericardial
fluid.  No evidence for pleural effusion.

Lung windows reveal advanced emphysema bilaterally.  No focal
airspace consolidation to suggest pyogenic pneumonia.  The patient
does have a 2.0 cm focus of spiculated focal soft tissue prominence
in the superior segment of the left lower lobe.

Imaging through the upper abdomen reveals a 9 mm hypervascular
lesion in the subcapsular right liver on image 122. Bone windows
reveal no worrisome lytic or sclerotic osseous lesions.

Review of the MIP images confirms the above findings.
IMPRESSION: No CT evidence for acute pulmonary embolus.

2.0 cm spiculated opacity the superior segment the left lower lobe.
This could be related to scarring, but given the focal appearance
and the lack of similar findings elsewhere in the markedly
emphysematous lungs, neoplasm is a distinct concern.  PET CT may
prove helpful to further evaluate.

9 mm hypervascular lesion in the right liver cannot be further
characterized.  This may well be a benign finding such as cavernous
hemangioma, but the etiology for this lesion cannot be confirmed on
today's exam.  If warranted, dedicated abdominal MRI without and
with contrast may prove helpful to further characterize.

## 2012-09-15 IMAGING — PT NM PET TUM IMG INITIAL (PI) SKULL BASE T - THIGH
6 series · 25 of 25 positions shown · non-contrast
Comparison: Chest CT 11/01/2010

CLINICAL DATA: Initial treatment strategy for left lower lobe
spiculated nodule.

NUCLEAR MEDICINE PET CT INITIAL (PI) SKULL BASE TO THIGH
TECHNIQUE: 18.9 mCi F-18 FDG was injected intravenously via the
right antecubital IV site.  Full-ring PET imaging was performed
from the skull base through the mid-thighs 53  minutes after
injection.  CT data was obtained and used for attenuation
correction and anatomic localization only.  (This was not acquired
as a diagnostic CT examination.)
Fasting Blood Glucose:  85

[Series 1: pet ac · axial · 3.3mm · 4.69mm/px · z∈[-870,+0]mm · 5 of 267 slices shown]
[im 1/267]
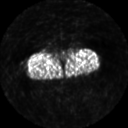
[im 67/267]
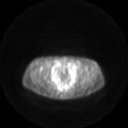
[im 134/267]
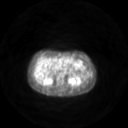
[im 200/267]
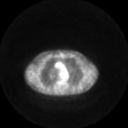
[im 267/267]
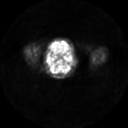

[Series 2: pet nac · axial · 3.3mm · 4.69mm/px · z∈[-870,+0]mm · 6 of 267 slices shown]
[im 1/267]
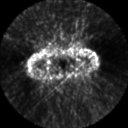
[im 54/267]
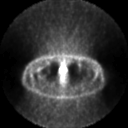
[im 107/267]
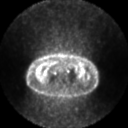
[im 160/267]
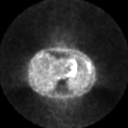
[im 213/267]
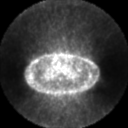
[im 267/267]
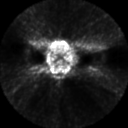

[Series 2: ct images · axial · 3.8mm · 0.98mm/px · z∈[-870,+0]mm · 5 of 266 slices shown]
[im 1/266]
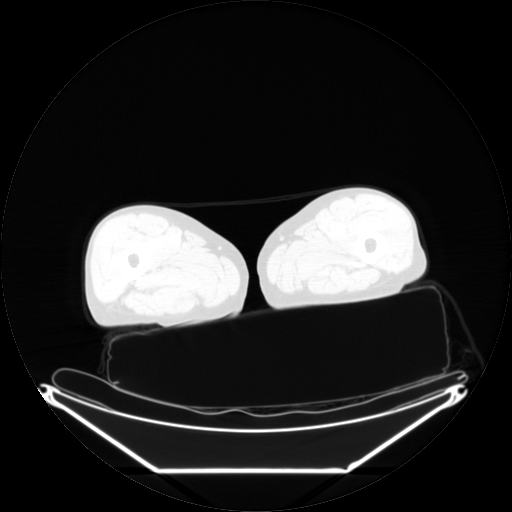
[im 67/266]
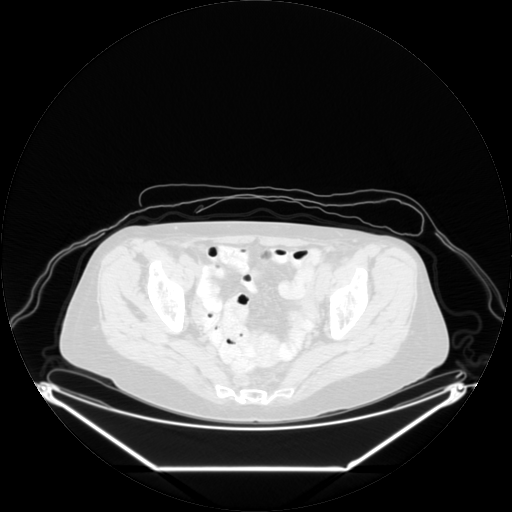
[im 133/266]
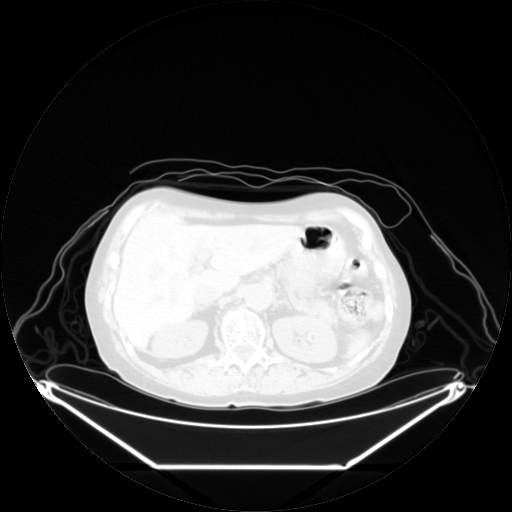
[im 199/266]
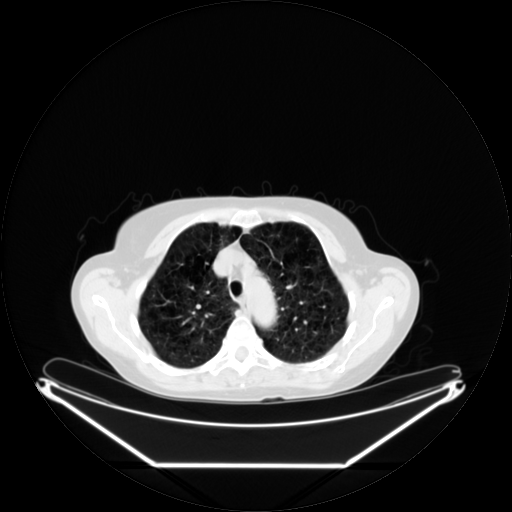
[im 266/266  brain]
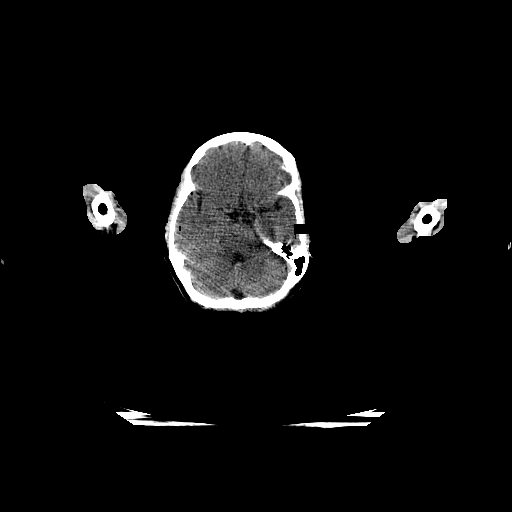

[Series 123: mip · coronal · 3.3mm · 4.69mm/px · 1 of 30 slices shown]
[im 1/30]
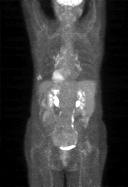

[Series 151: reformatted · axial · 3.3mm · 3.91mm/px · z∈[-870,+0]mm · 6 of 267 slices shown (1 of 2)]
[im 1/267]
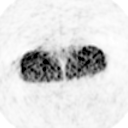
[im 54/267]
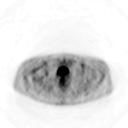
[im 107/267]
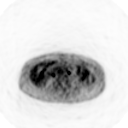
[im 160/267]
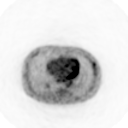
[im 213/267]
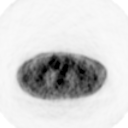
[im 267/267]
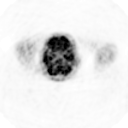

[Series 153: reformatted · coronal · 4.7mm · 6.98mm/px · 2 of 71 slices shown (2 of 2)]
[im 1/71]
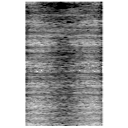
[im 71/71]
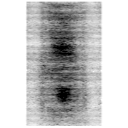

[25 of 25 positions shown; findings below may reference images not displayed]

FINDINGS: The previously reported left lower lobe spiculated
nodular opacity demonstrates low-level F D G uptake, S U V maximum
2.4.

There is low-level F D G uptake within the left lateral seventh and
eighth ribs, S U V maximum 4.3.

Physiologic F D G uptake is noted within the nondilated renal
collecting systems and bowel.  No area of abnormal F D G uptake is
noted within the neck, abdomen, or pelvis.

Review CT images obtained for attenuation correction purposes
demonstrates extensive atherosclerotic aortic calcification and
other vascular calcification with aneurysmal dilatation of the
right common iliac artery measuring 2 cm and the infrarenal
abdominal aorta measuring 3.0 x 2.8 cm.  2 mm nonobstructive right
mid renal calculus or cortical calcification is incidentally noted.
IMPRESSION: Low-level F D G uptake within the left lower lobe area of pulmonary
parenchymal nodularity, which may be seen with infectious or
neoplastic etiologies.

Moderate F D G uptake within the left lateral seventh and eighth
ribs.  On retrospective review of the recent diagnostic exam, there
is minimal cortical irregularity involving the left lateral eighth
rib, for which the differential considerations would primarily
include fracture or less likely neoplasm.  Correlate with any
history of trauma to this area.

## 2012-09-29 IMAGING — CR DG CHEST 1V PORT
1 series · 1 of 1 positions shown · non-contrast
Comparison: Chest x-ray dated 11/27/2010

CLINICAL DATA: Post bronchoscopy.  Left lower lobe pulmonary
nodule.

PORTABLE CHEST - 1 VIEW

[view not recorded]
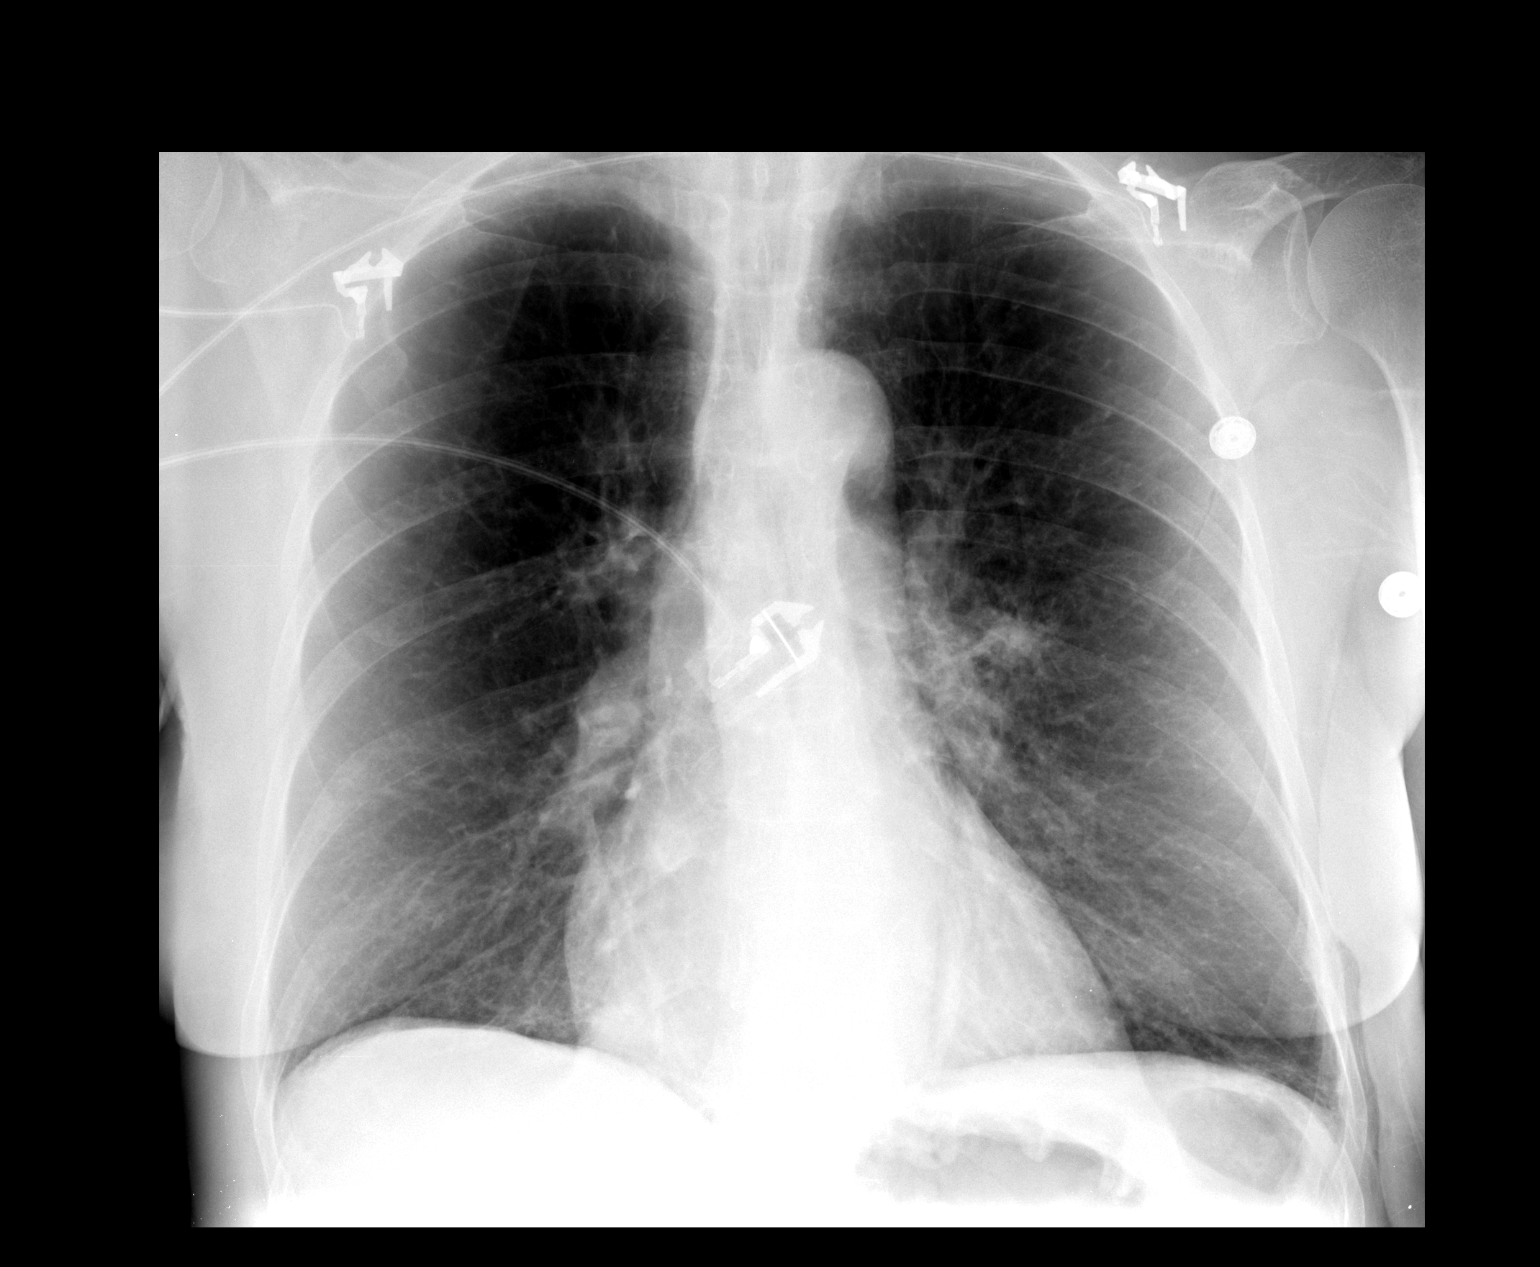

[1 of 1 positions shown; findings below may reference images not displayed]

FINDINGS: There is no pneumothorax or parenchymal hemorrhage.
There is slight increased density just lateral to the left hilum
which appears to be new but this probably a confluence of vascular
structures which overlapped because of the slight  lordotic view.

Heart size and vascularity are normal.
IMPRESSION: No acute abnormality.  No hemorrhage or pneumothorax.  The
previously visualized ill-defined density in the left midzone is
not appreciable on this study.

## 2012-09-29 IMAGING — CR DG CHEST 2V
2 series · 2 of 2 positions shown · non-contrast
Comparison: None.

CLINICAL DATA: Preop.  Lung mass.

CHEST - 2 VIEW

[view not recorded (1 of 2)]
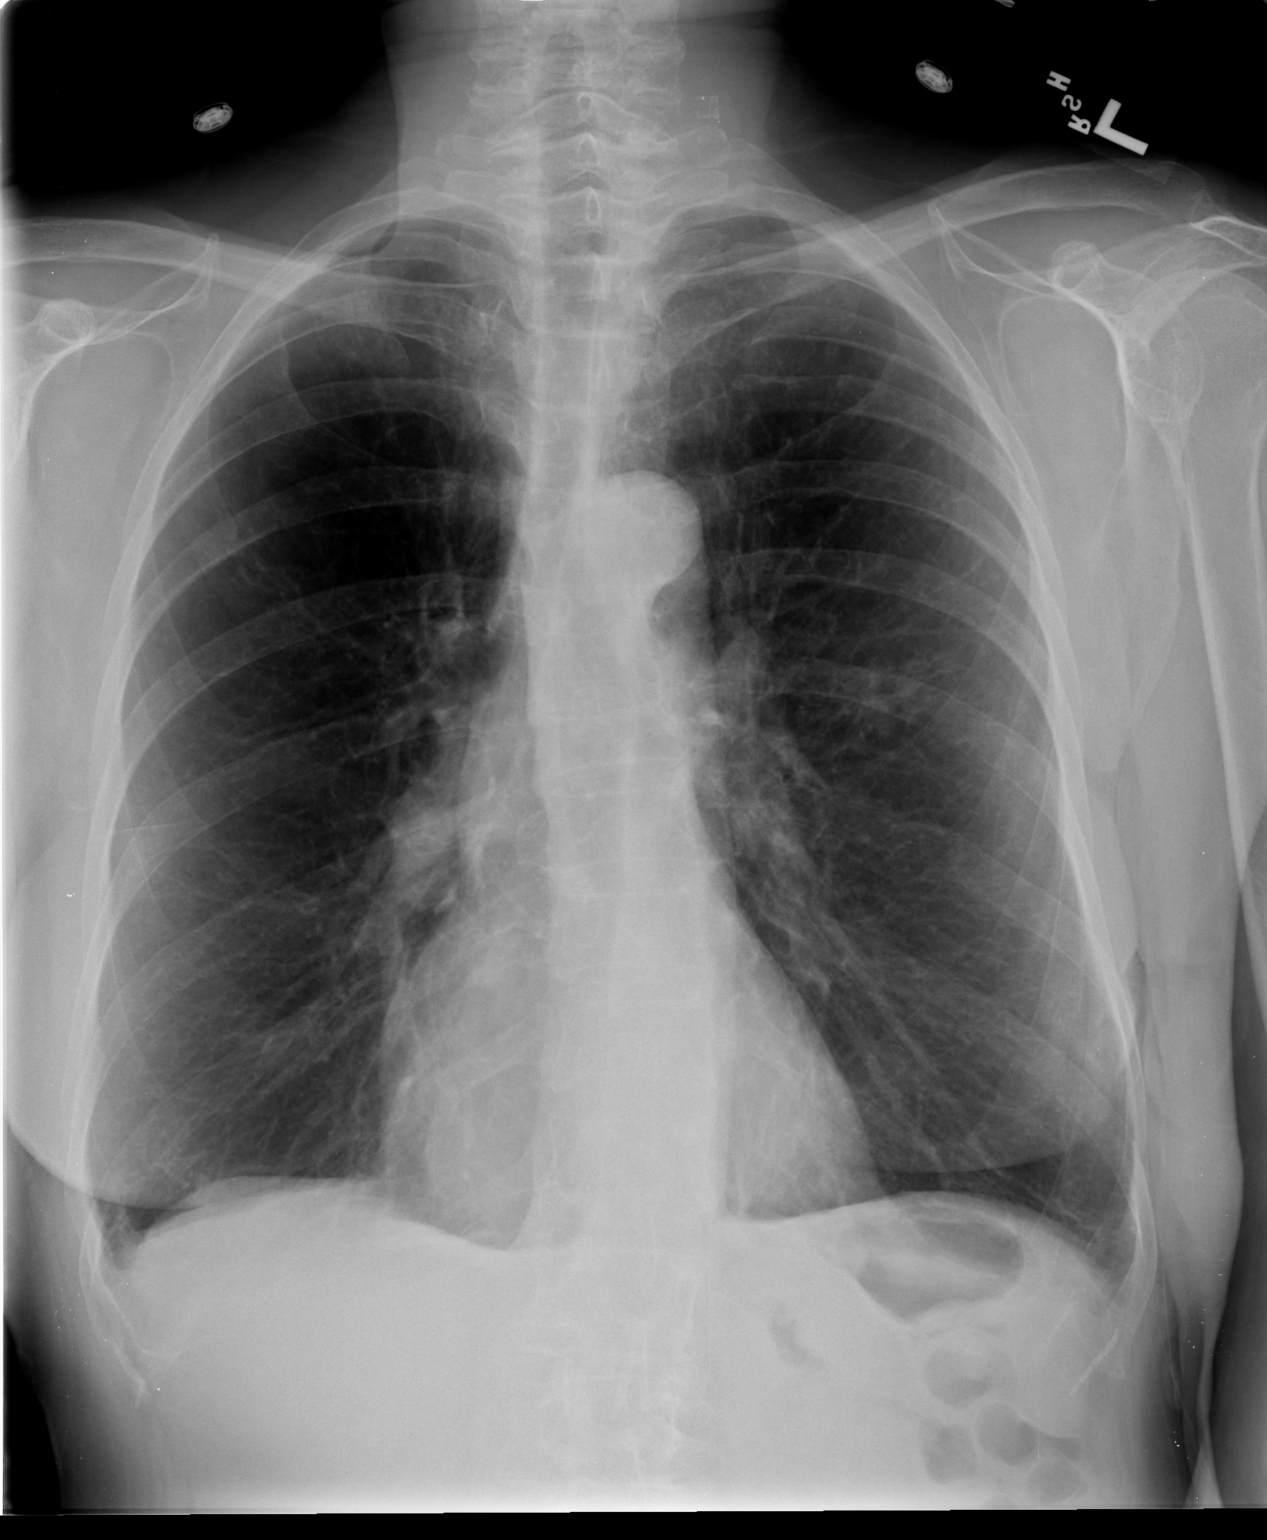

[view not recorded (2 of 2)]
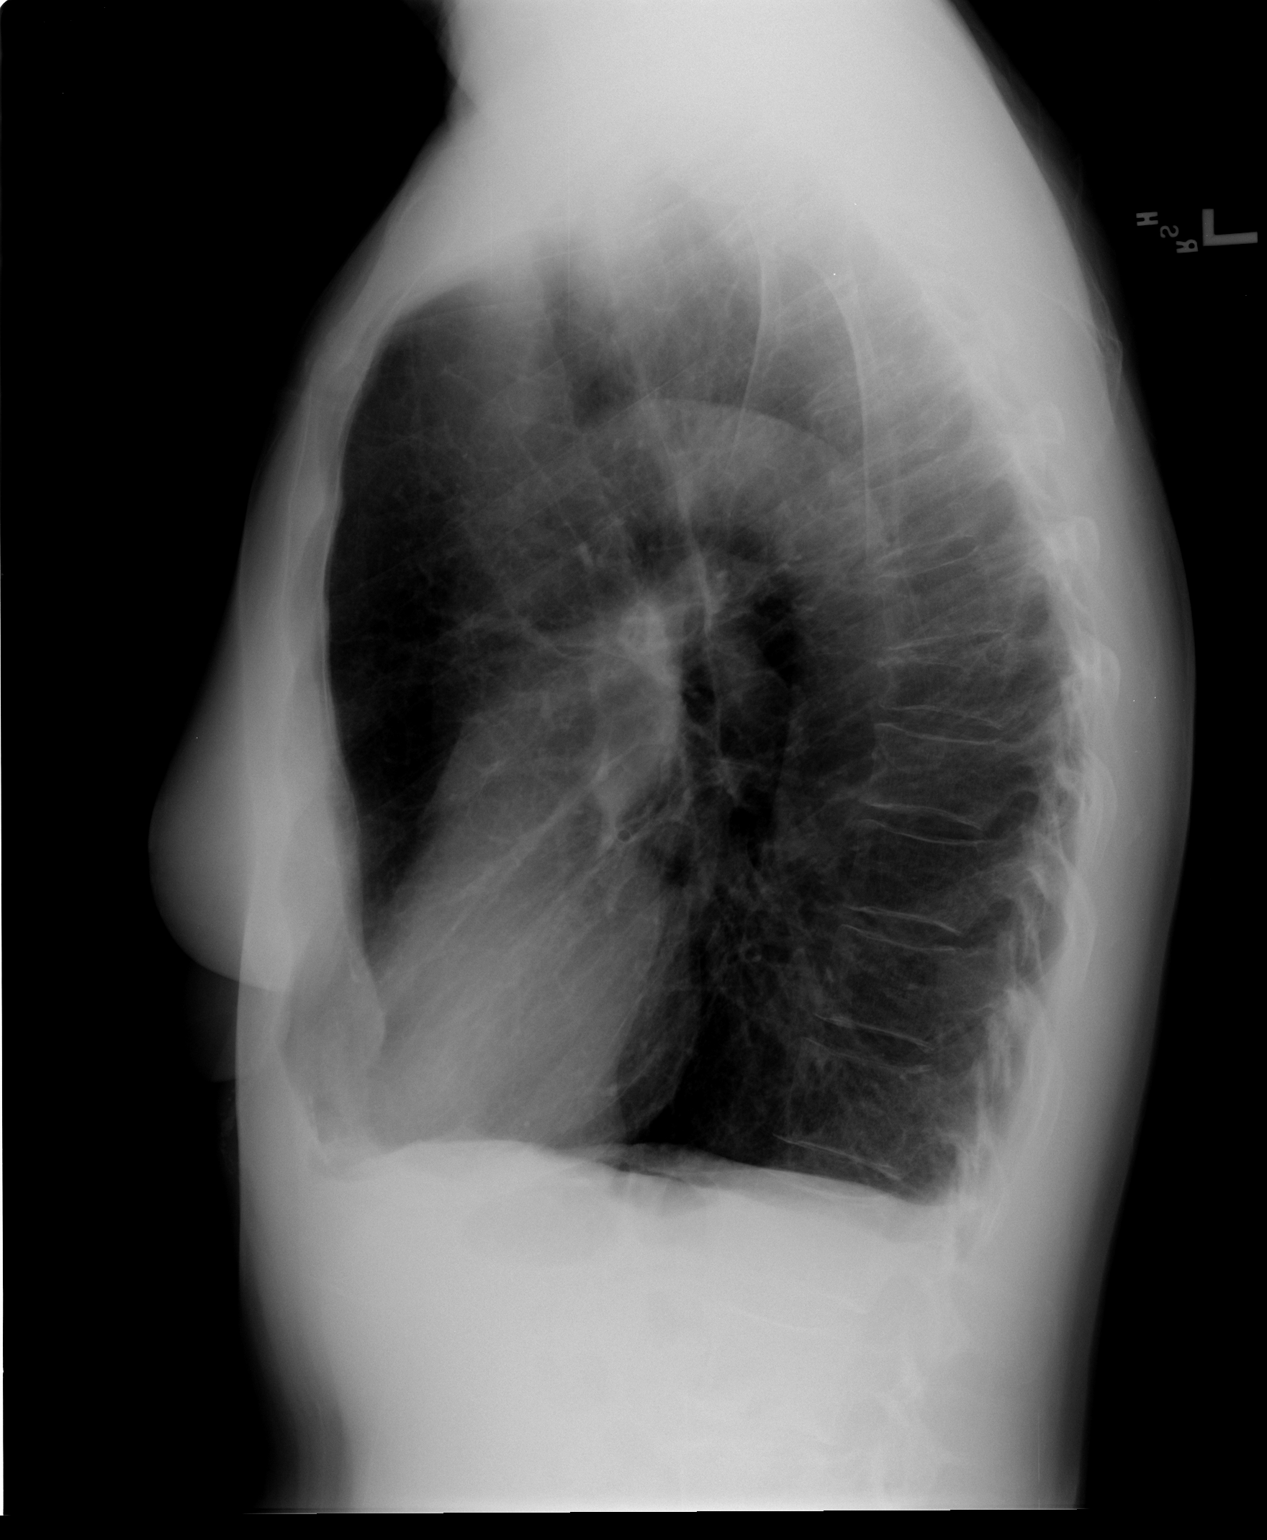

[2 of 2 positions shown; findings below may reference images not displayed]

FINDINGS: There is an ill-defined patchy density in the left
perihilar region corresponding to the known lung mass.  Otherwise,
clear lungs.  No pneumothorax.  No pleural effusion.
Hyperaeration.  Emphysema.
IMPRESSION: Findings related to COPD.  Left lower lobe lung mass is noted.

Stable mid level thoracic compression deformity.

## 2012-10-23 IMAGING — CR DG CHEST 1V PORT
1 series · 1 of 1 positions shown · non-contrast
Comparison: Chest x-ray of 12/19/2010

CLINICAL DATA: Postop left VATS

PORTABLE CHEST - 1 VIEW

[AP]
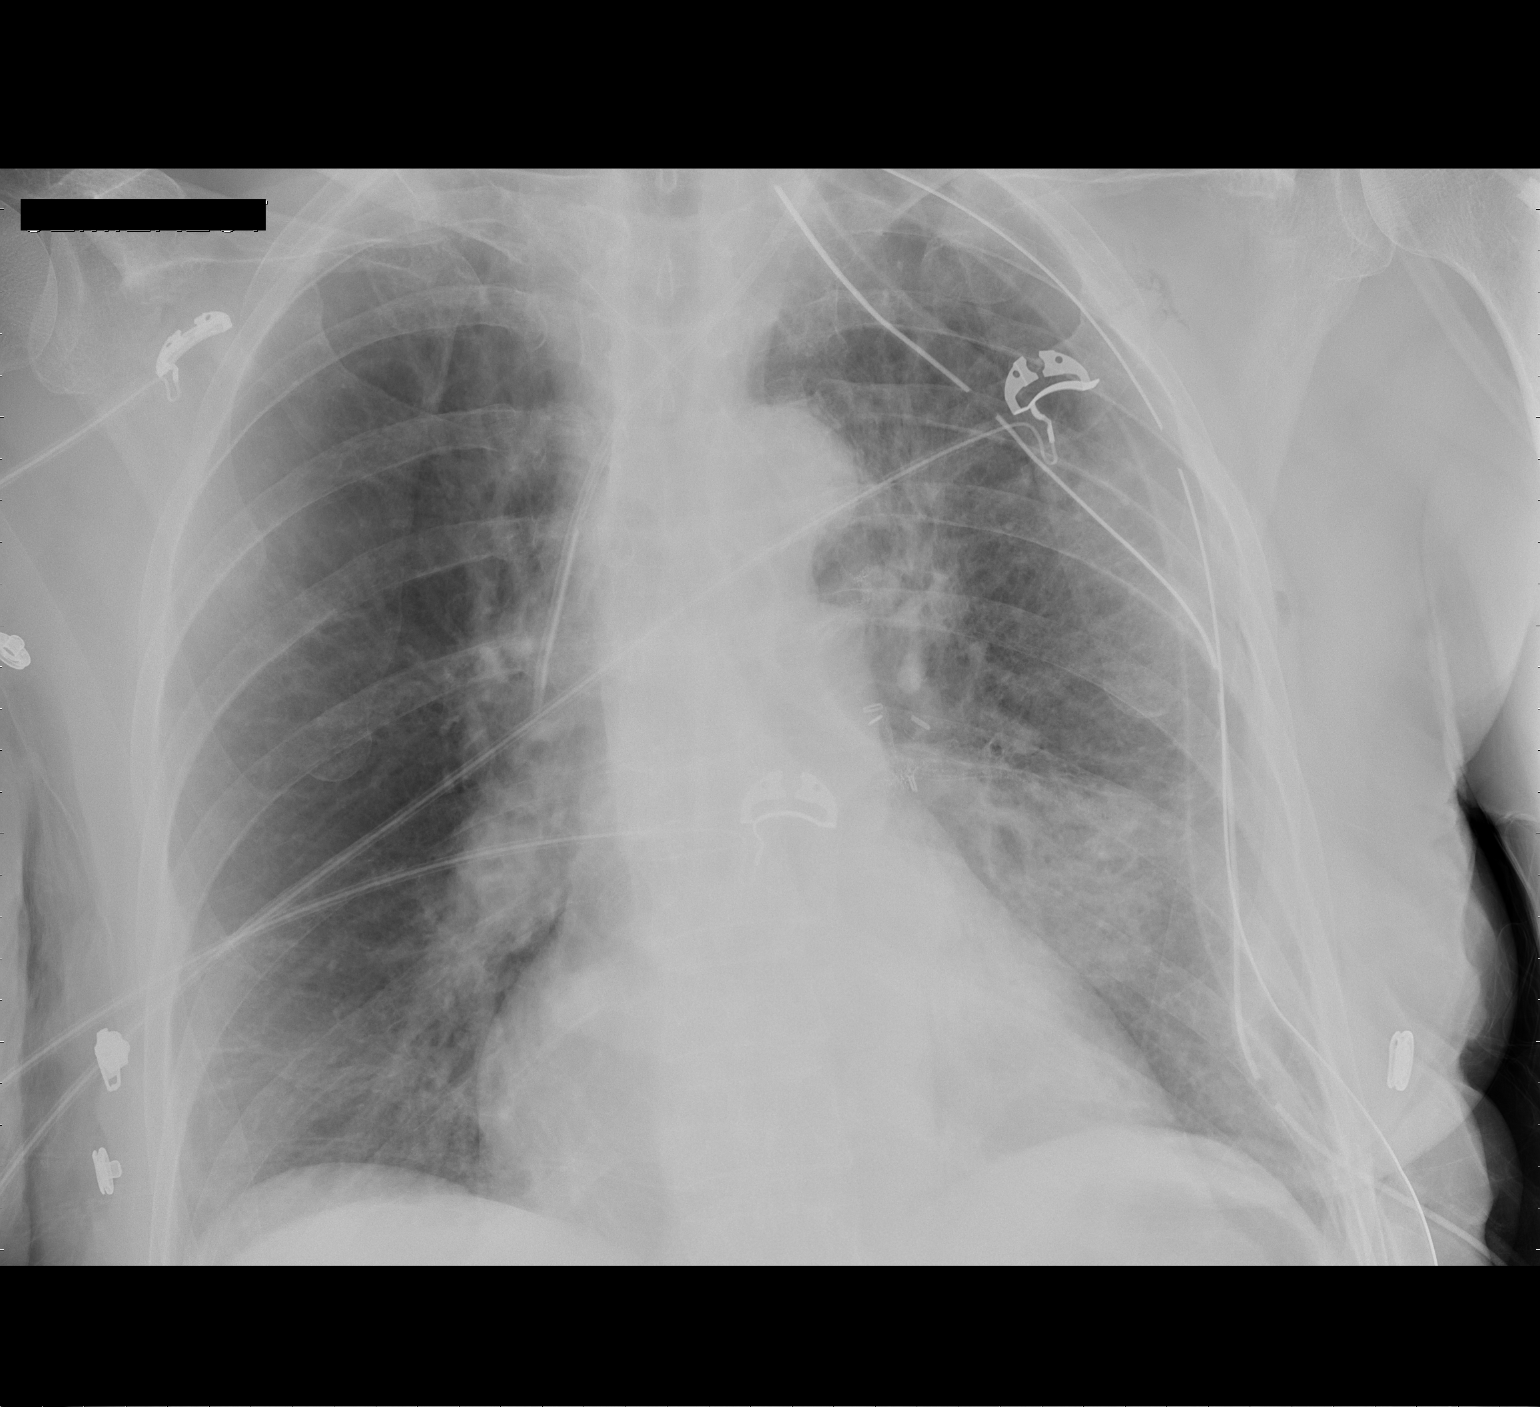

[1 of 1 positions shown; findings below may reference images not displayed]

FINDINGS: The left lung lesion has been resected and two left chest
tubes are present.  No pneumothorax is seen.  There is however a
new opacity at the left lung base which may be postoperative in
nature but pneumonia cannot be excluded.  The right lung is clear.
Left IJ central venous catheter tip is seen to the mid SVC.  Heart
size is stable.
IMPRESSION: 1.  Status post left VATS with two left chest tubes present.  No
pneumothorax.
2.  New opacity at the left base presumably postoperative.  Cannot
exclude pneumonia .
3.  Left IJ central venous catheter tip in lower SVC.

## 2012-10-24 IMAGING — CR DG CHEST 1V PORT
1 series · 1 of 1 positions shown · non-contrast
Comparison: 12/21/2010.

CLINICAL DATA: Post left VATS.

PORTABLE CHEST - 1 VIEW

[AP]
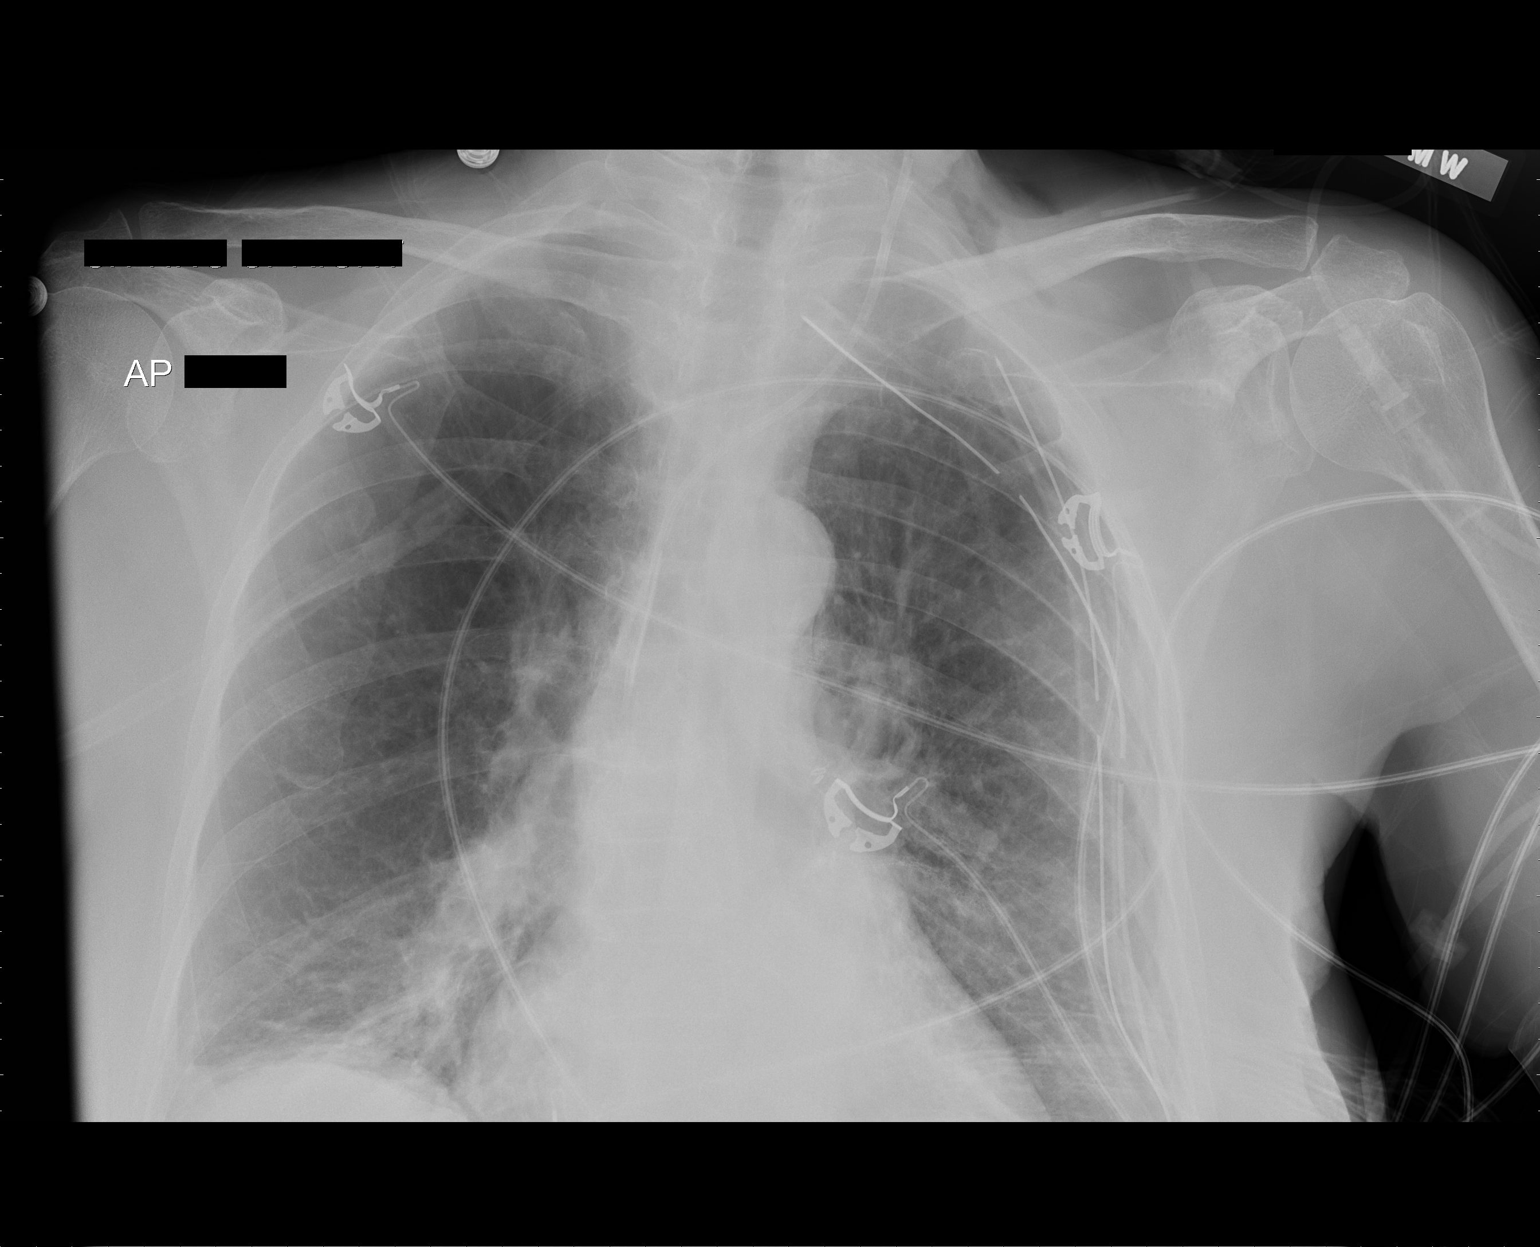

[1 of 1 positions shown; findings below may reference images not displayed]

FINDINGS: Two left chest tubes are in place.  No pneumothorax is
evident.  Tip of left internal jugular venous catheter is in the
lower mid superior vena cava.  Subcutaneous air is seen in the
lower left neck and upper lateral left hemithorax.  There is
elevation of the right hemidiaphragm with increased atelectasis in
the right lower lung.  Minimal left basilar atelectasis is seen.
The infiltrative density in the lower left midlung zone on prior
study is no longer seen.  There is stable minimal cardiac
silhouette enlargement.
IMPRESSION: 2 left chest tubes are in place.  No pneumothorax.  Tip of left
internal jugular venous catheter is in lower mid superior vena
cava. Subcutaneous air is present in the lower left neck and upper
lateral left hemithorax.

Increased atelectasis and infiltrate in right lower lung.  Minimal
left basilar atelectasis.  Patchy infiltrative density in lower
left midlung area on previous study is no longer evident.

## 2012-10-25 IMAGING — CR DG CHEST 1V PORT
1 series · 1 of 1 positions shown · non-contrast
Comparison: 12/22/2010.

CLINICAL DATA: Follow-up radiograph.  Evaluate support apparatus.
VATS.

PORTABLE CHEST - 1 VIEW

[view not recorded]
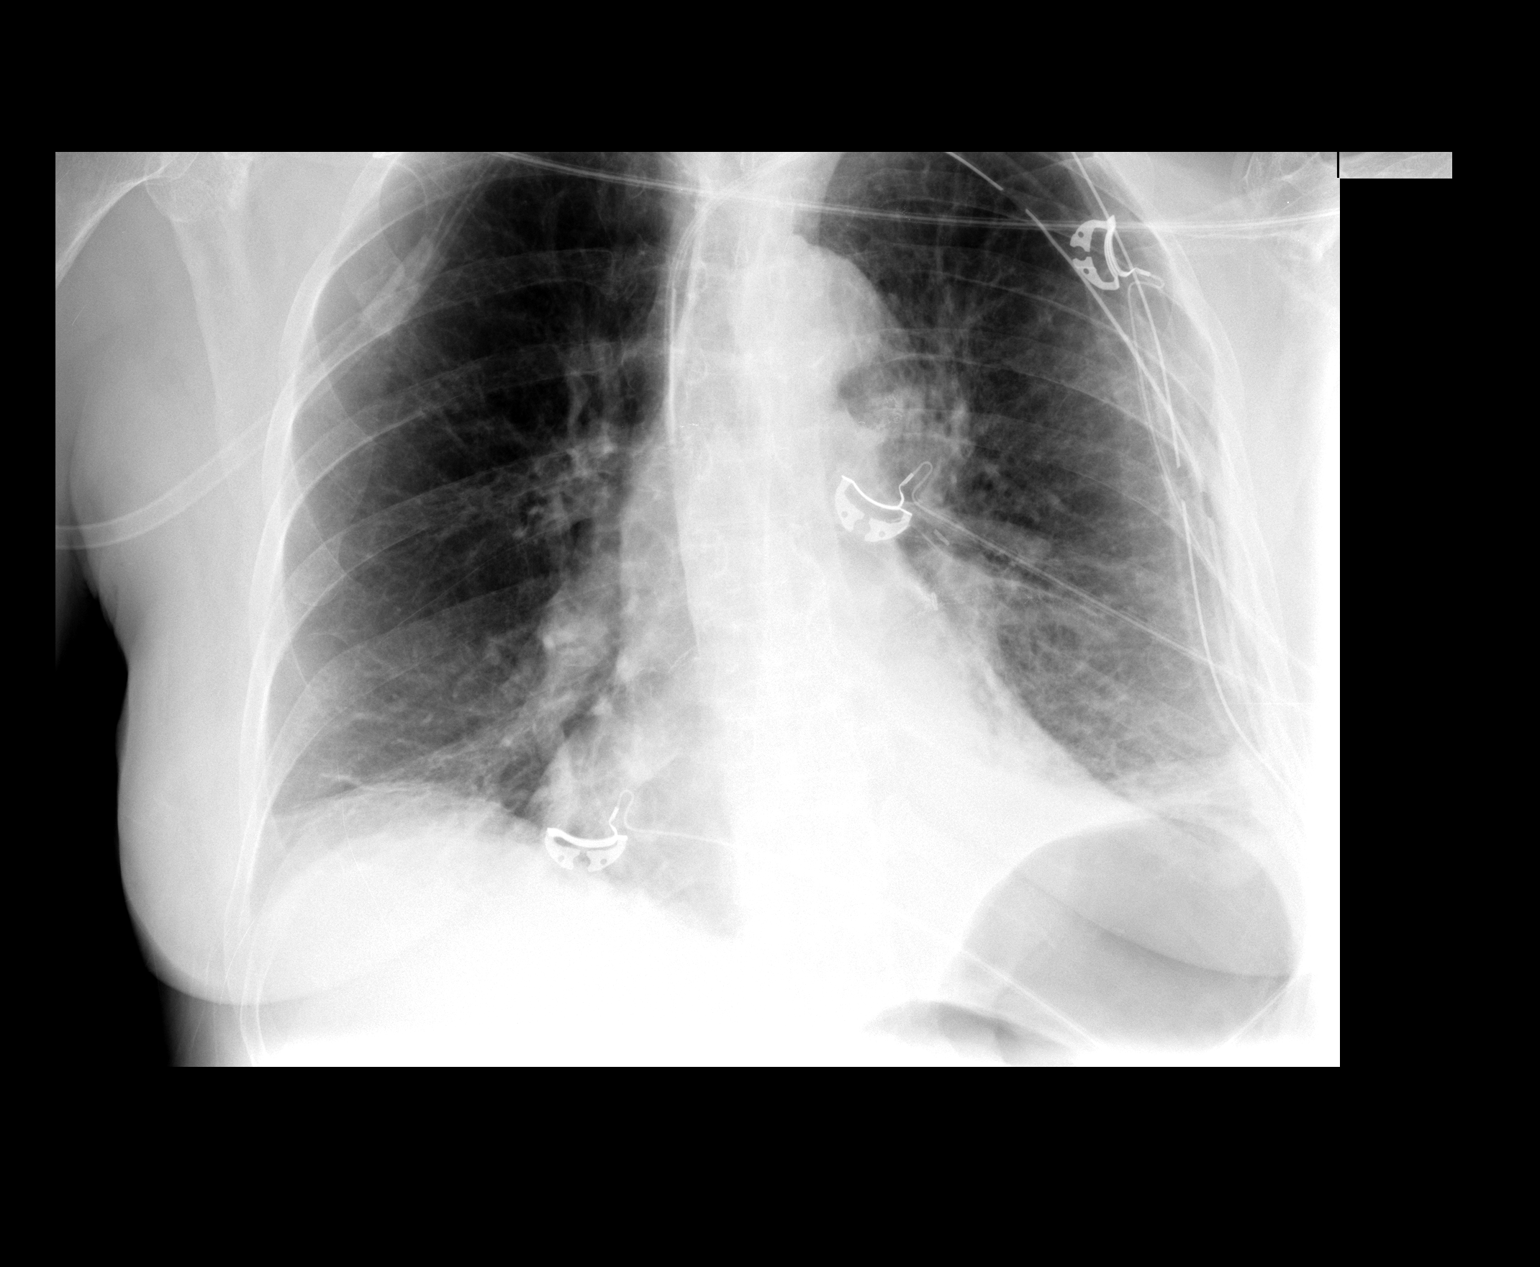

[1 of 1 positions shown; findings below may reference images not displayed]

FINDINGS: A tiny left apical pneumothorax with dual left
thoracostomy tubes directed apically.  Left IJ central line
terminates in the SVC.  Right lung appears better aerated at the
base with decreasing atelectasis.  There is increasing atelectasis
at the left lung base and in the retrocardiac region.
IMPRESSION: 1.  Tiny left apical pneumothorax status post VATS with dual left
thoracostomy tubes.
2.  Increasing left basilar atelectasis and improving right basilar
atelectasis.
3.  Unchanged left IJ central line.

## 2012-10-26 IMAGING — CR DG CHEST 1V PORT
1 series · 1 of 1 positions shown · non-contrast
Comparison: Yesterday

CLINICAL DATA: Postop

PORTABLE CHEST - 1 VIEW

[view not recorded]
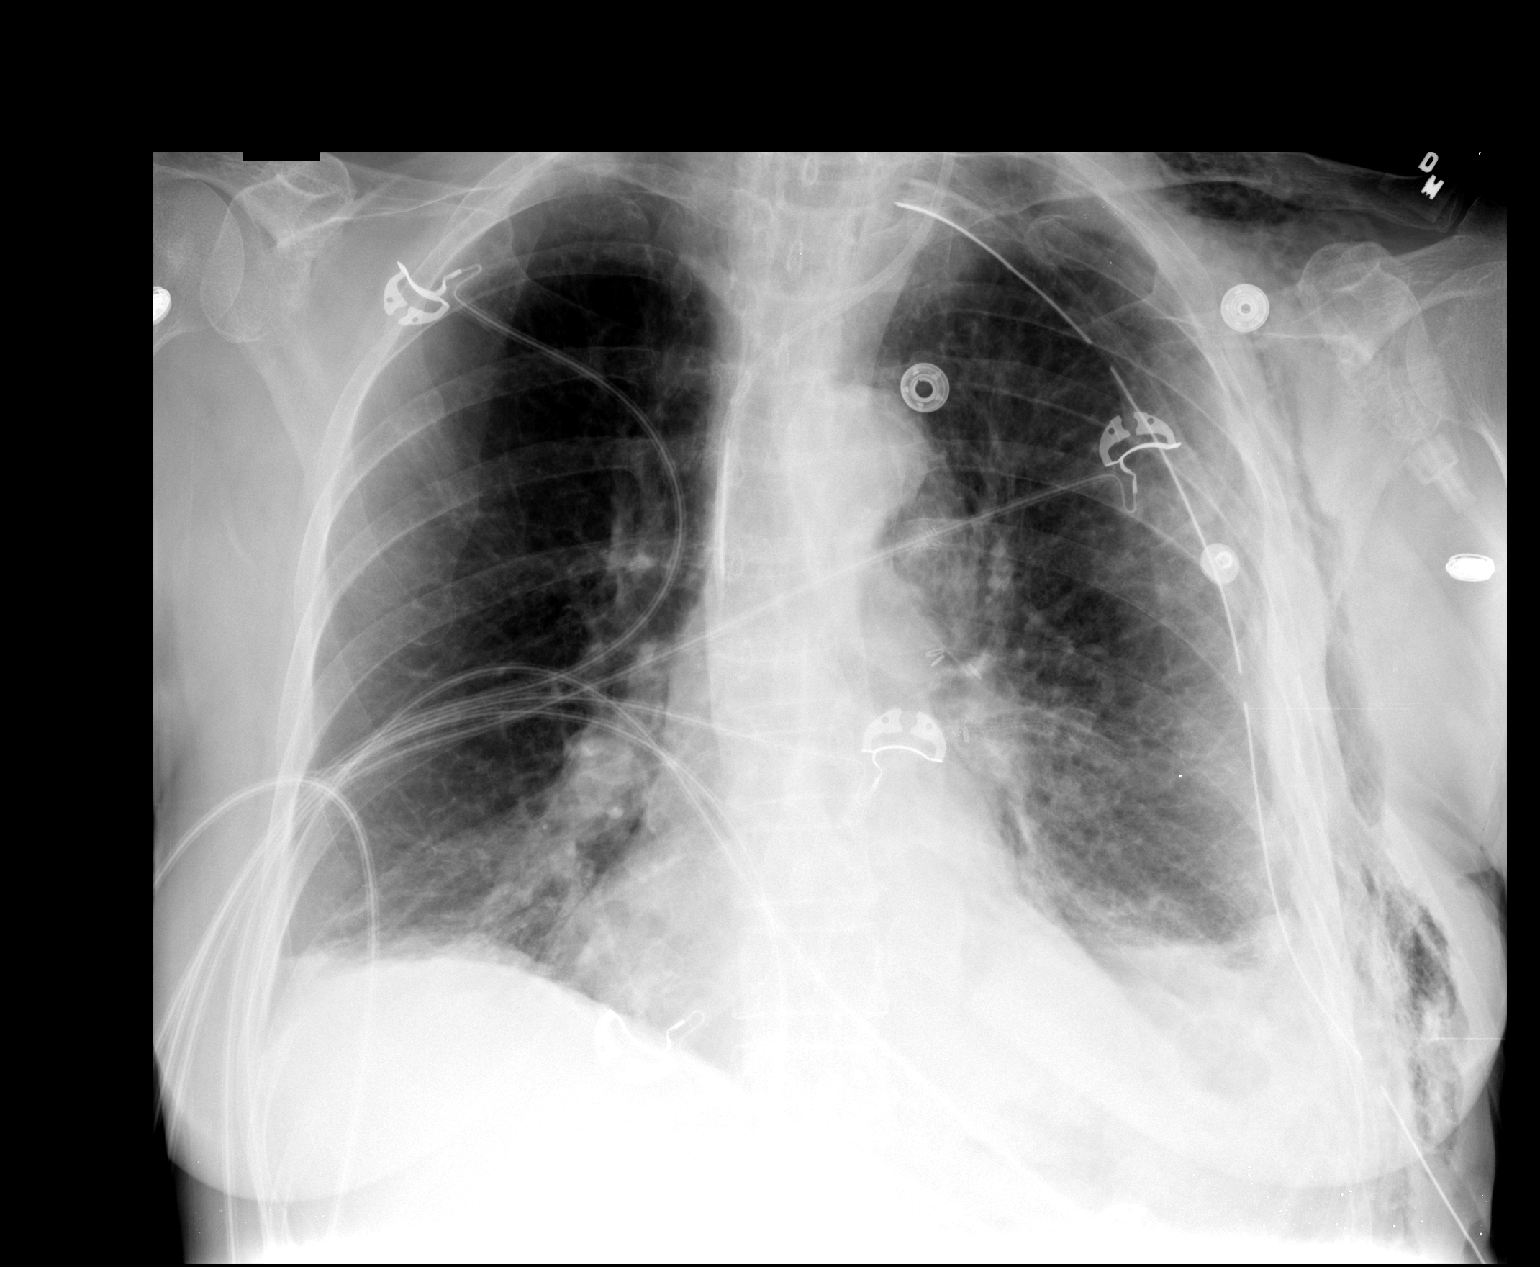

[1 of 1 positions shown; findings below may reference images not displayed]

FINDINGS: Tiny left pneumothorax persists.  Stable left chest tube.
Bibasilar opacities unchanged.  Upper normal heart size.  Left neck
venous catheter unchanged.
IMPRESSION: Stable basilar opacities and tiny left pneumothorax.

## 2012-10-27 IMAGING — CR DG CHEST 2V
2 series · 2 of 2 positions shown · non-contrast
Comparison: 12/24/2010

CLINICAL DATA: Post surgery for left upper lobe mass - chest tube
removed

CHEST - 2 VIEW

[w chest pa]
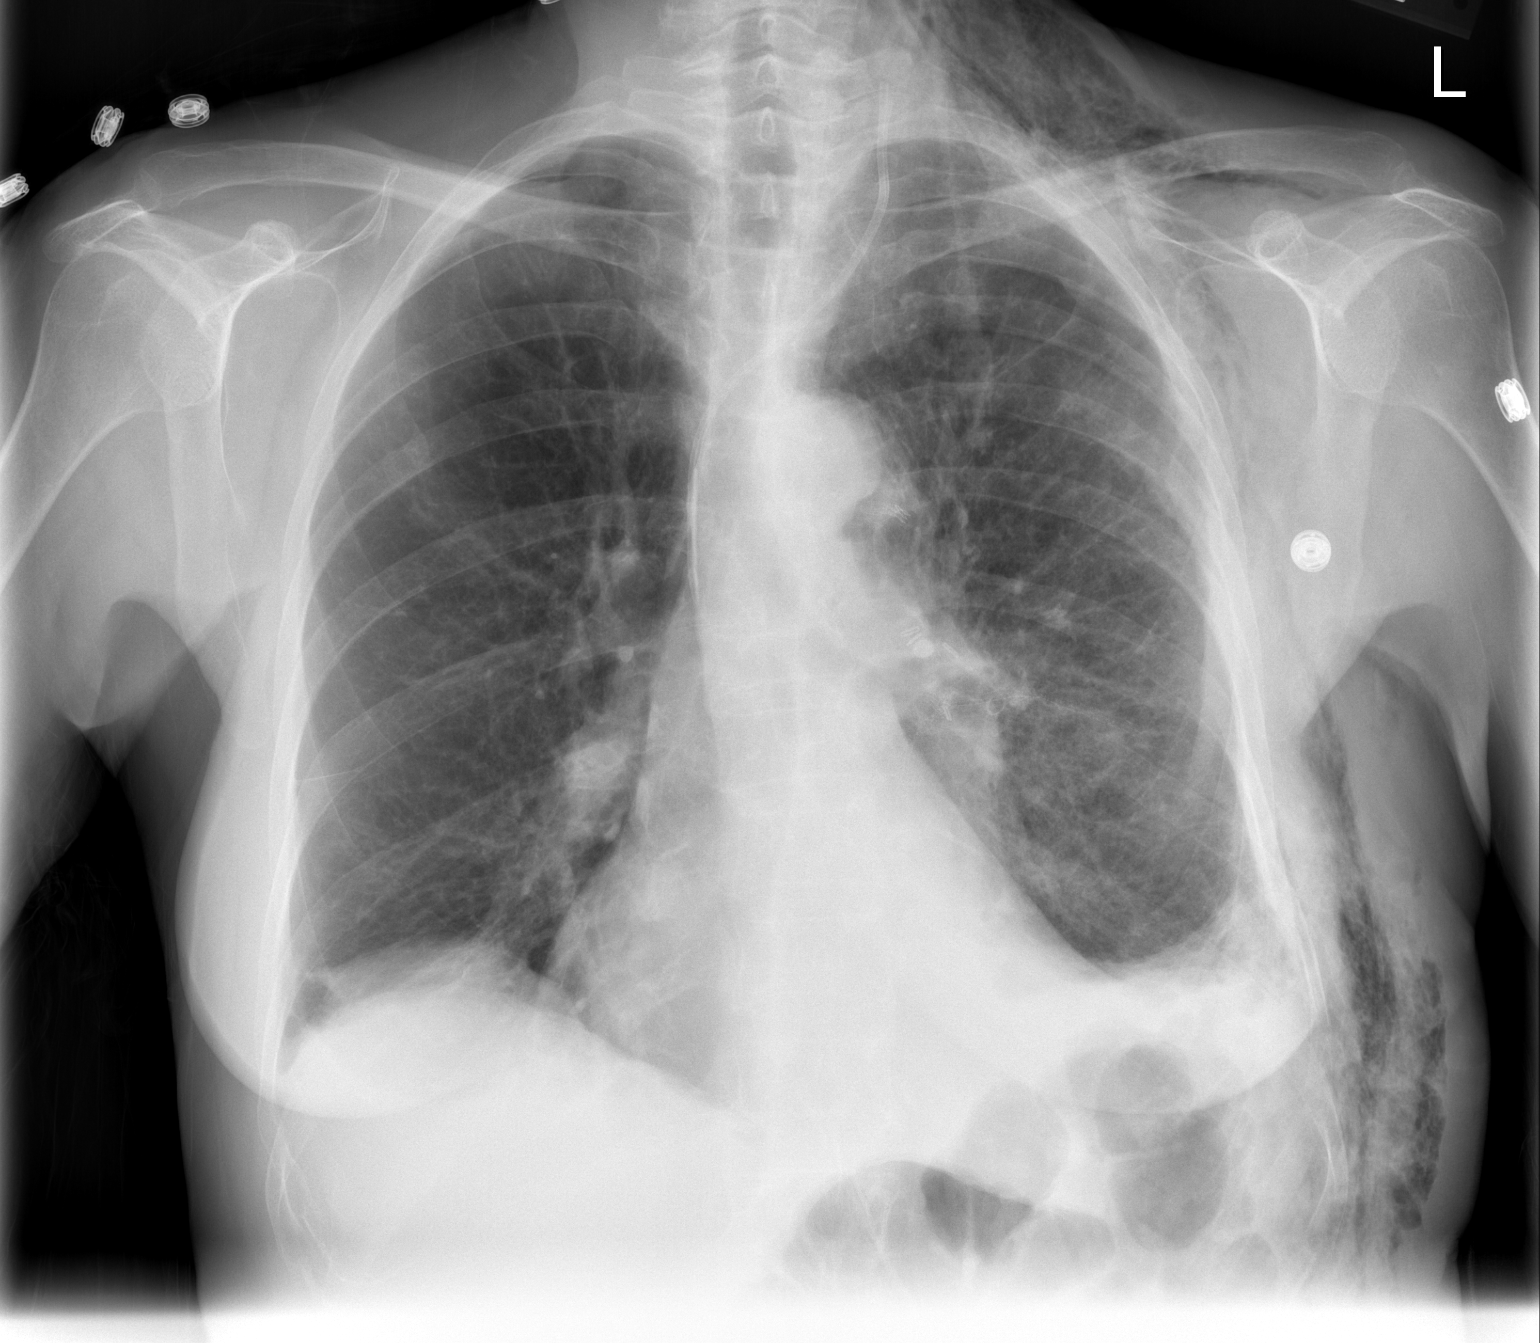

[w chest lat]
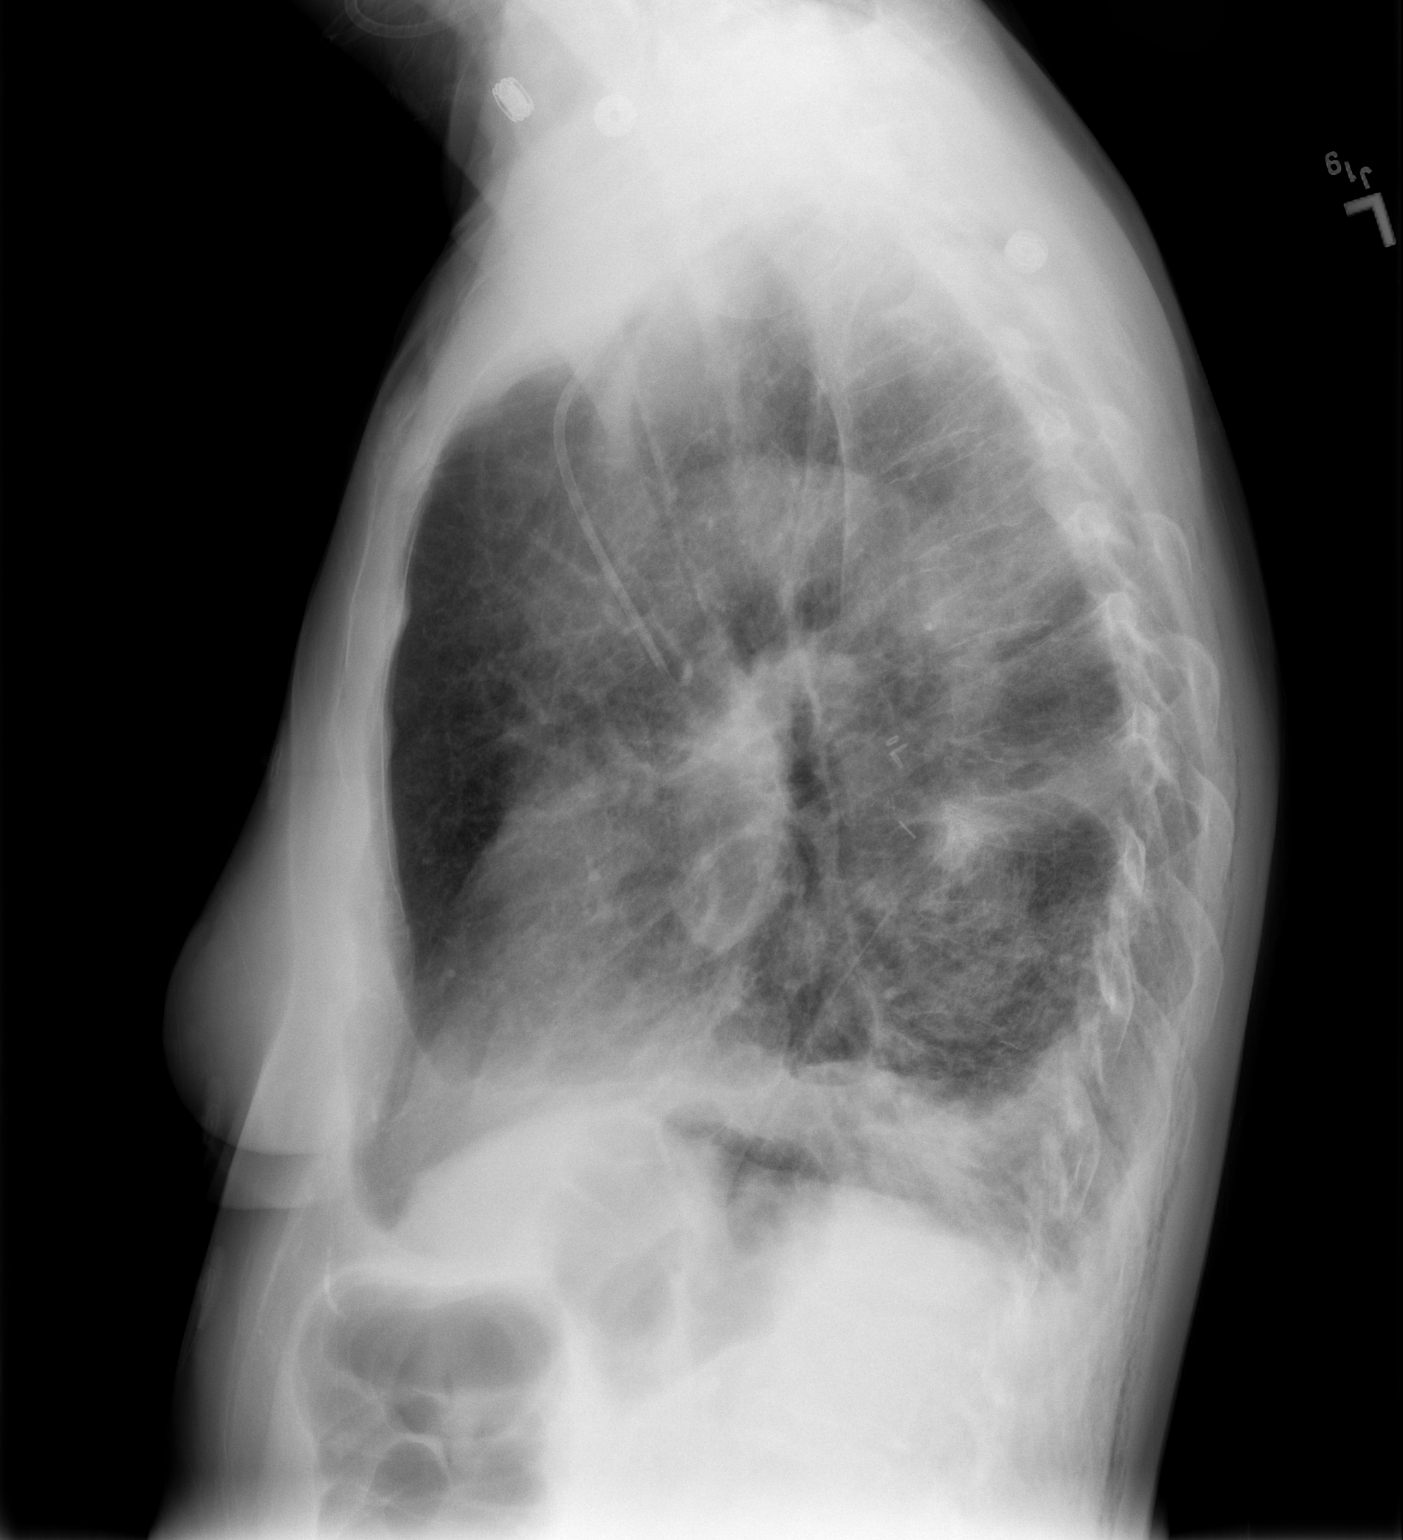

[2 of 2 positions shown; findings below may reference images not displayed]

FINDINGS: Left chest tube removed.  There is subcutaneous emphysema
again noted.  No visible pneumothorax.  There is still some left
lung airspace density and pleural reaction.  Right lung clear.
IMPRESSION: 1.  No visible pneumothorax following left chest tube removal.
Subcutaneous emphysema about the same.

2.  No new findings.

## 2012-10-28 IMAGING — CR DG CHEST 2V
2 series · 2 of 2 positions shown · non-contrast
Comparison: Chest x-ray 12/25/2010.

CLINICAL DATA: Left lung lesion resection.

CHEST - 2 VIEW

[w chest pa]
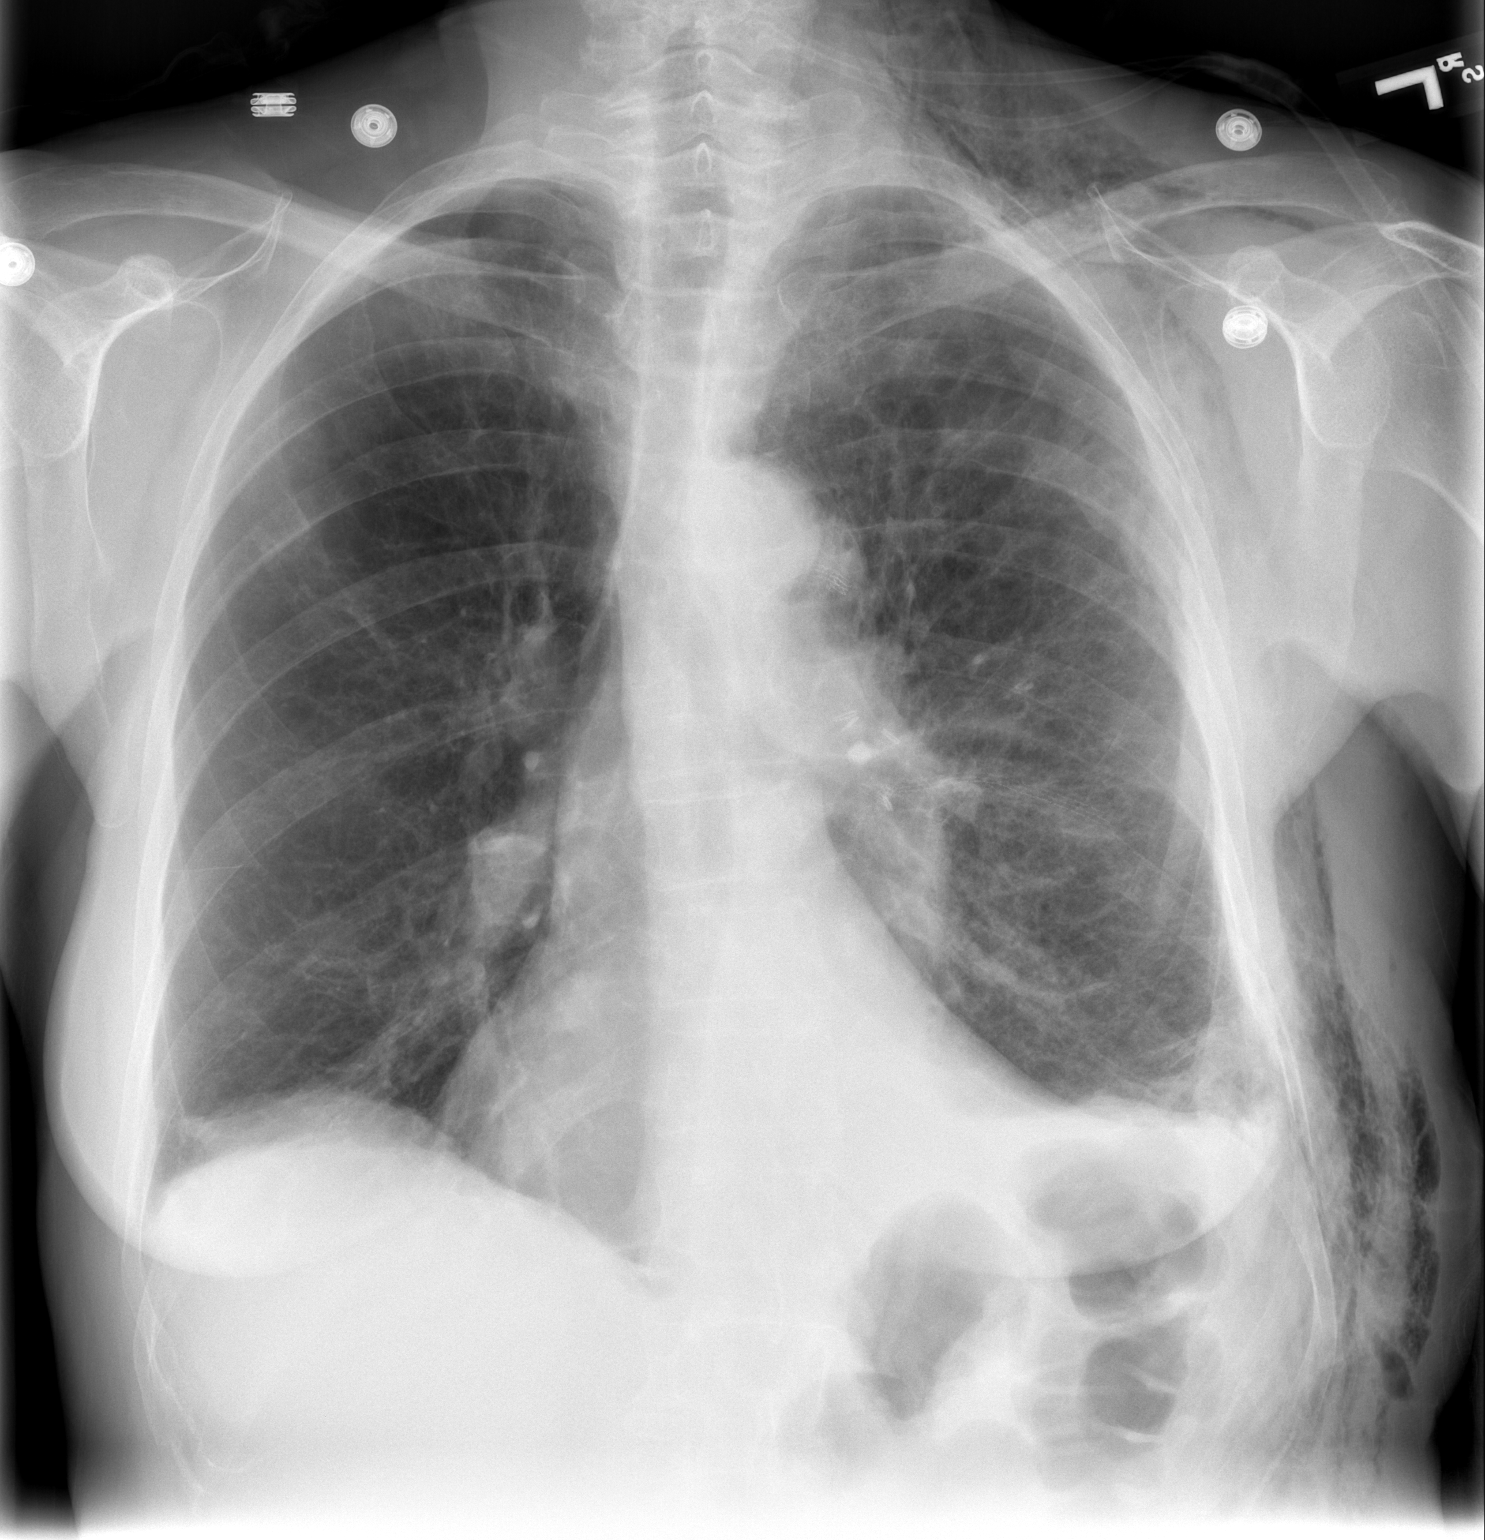

[w chest lat]
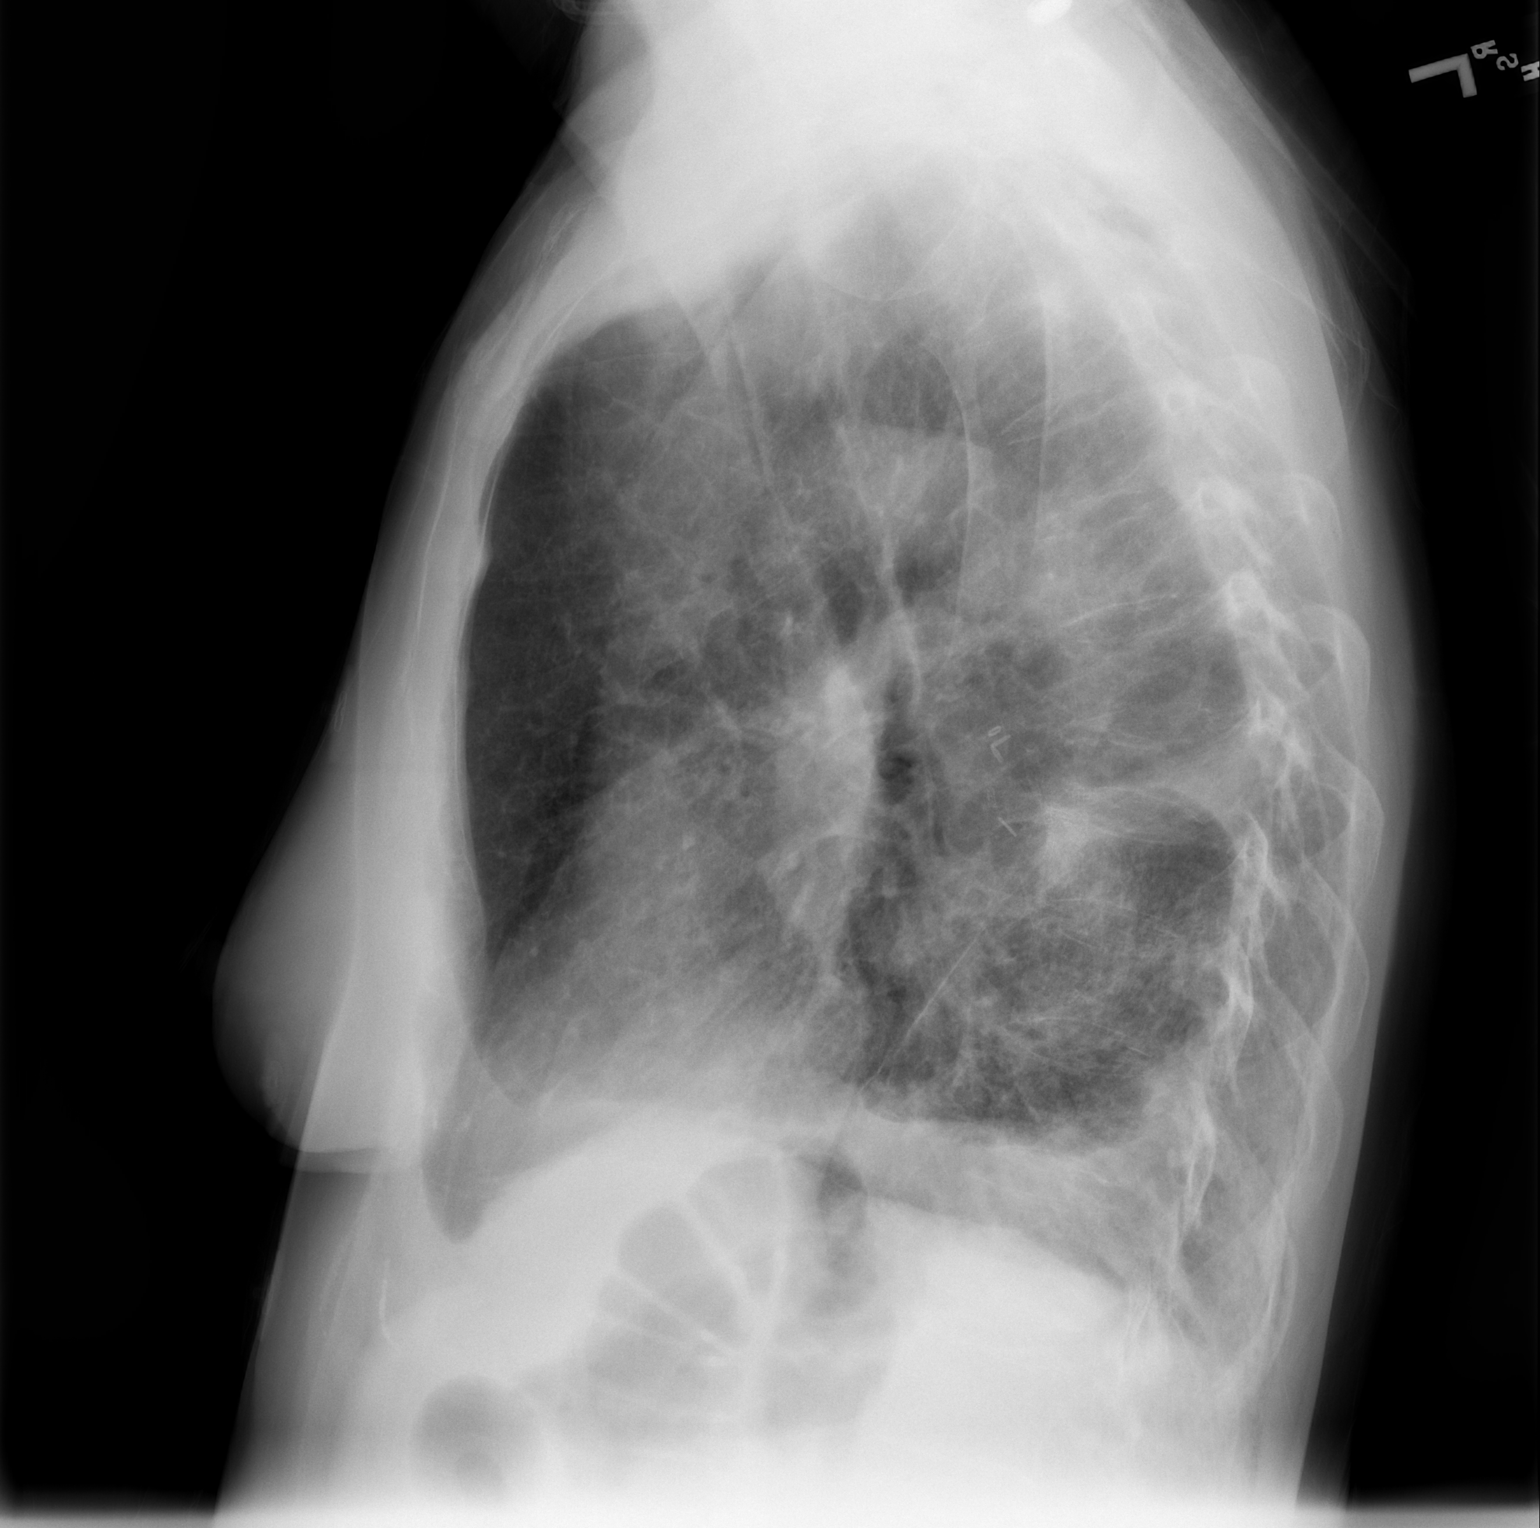

[2 of 2 positions shown; findings below may reference images not displayed]

FINDINGS: The left IJ catheter has been removed.  Persistent
subcutaneous emphysema.  No obvious left-sided pneumothorax.  Lung
aeration is stable with streaky bibasilar atelectasis.  Stable
postoperative changes in the left lung.
IMPRESSION: 1.  Removal of left IJ catheter.
2.  Persistent subcutaneous emphysema without obvious pneumothorax.
3.  Patchy bibasilar atelectasis.

## 2012-10-31 ENCOUNTER — Ambulatory Visit (HOSPITAL_COMMUNITY)
Admission: RE | Admit: 2012-10-31 | Discharge: 2012-10-31 | Disposition: A | Discharge status: Home / Self Care | Payer: Medicare Other | Source: Ambulatory Visit | Attending: Oncology | Admitting: Oncology

## 2012-10-31 ENCOUNTER — Encounter (HOSPITAL_COMMUNITY): Payer: Medicare Other | Attending: Oncology

## 2012-10-31 DIAGNOSIS — C3490 Malignant neoplasm of unspecified part of unspecified bronchus or lung: Secondary | ICD-10-CM

## 2012-10-31 DIAGNOSIS — R911 Solitary pulmonary nodule: Secondary | ICD-10-CM | POA: Insufficient documentation

## 2012-10-31 DIAGNOSIS — Z85118 Personal history of other malignant neoplasm of bronchus and lung: Secondary | ICD-10-CM | POA: Insufficient documentation

## 2012-10-31 DIAGNOSIS — Z09 Encounter for follow-up examination after completed treatment for conditions other than malignant neoplasm: Secondary | ICD-10-CM | POA: Insufficient documentation

## 2012-10-31 LAB — CBC WITH DIFFERENTIAL/PLATELET
Basophils Absolute: 0.1 10*3/uL (ref 0.0–0.1)
Basophils Relative: 1 % (ref 0–1)
Eosinophils Absolute: 0.2 10*3/uL (ref 0.0–0.7)
Eosinophils Relative: 4 % (ref 0–5)
HCT: 44.7 % (ref 36.0–46.0)
Hemoglobin: 15 g/dL (ref 12.0–15.0)
Lymphocytes Relative: 22 % (ref 12–46)
Lymphs Abs: 1.4 10*3/uL (ref 0.7–4.0)
MCH: 30.5 pg (ref 26.0–34.0)
MCHC: 33.6 g/dL (ref 30.0–36.0)
MCV: 91 fL (ref 78.0–100.0)
Monocytes Absolute: 0.4 10*3/uL (ref 0.1–1.0)
Monocytes Relative: 7 % (ref 3–12)
Neutro Abs: 4.4 10*3/uL (ref 1.7–7.7)
Neutrophils Relative %: 67 % (ref 43–77)
Platelets: 348 10*3/uL (ref 150–400)
RBC: 4.91 MIL/uL (ref 3.87–5.11)
RDW: 13.3 % (ref 11.5–15.5)
WBC: 6.6 10*3/uL (ref 4.0–10.5)

## 2012-10-31 LAB — COMPREHENSIVE METABOLIC PANEL
ALT: 12 U/L (ref 0–35)
AST: 21 U/L (ref 0–37)
Albumin: 4.2 g/dL (ref 3.5–5.2)
Alkaline Phosphatase: 64 U/L (ref 39–117)
BUN: 10 mg/dL (ref 6–23)
CO2: 31 mEq/L (ref 19–32)
Calcium: 10.4 mg/dL (ref 8.4–10.5)
Chloride: 99 mEq/L (ref 96–112)
Creatinine, Ser: 0.66 mg/dL (ref 0.50–1.10)
GFR calc Af Amer: >90 mL/min (ref >90)
GFR calc non Af Amer: 87 mL/min — ABNORMAL LOW (ref >90)
Glucose, Bld: 107 mg/dL — ABNORMAL HIGH (ref 70–99)
Potassium: 3.9 mEq/L (ref 3.5–5.1)
Sodium: 138 mEq/L (ref 135–145)
Total Bilirubin: 0.4 mg/dL (ref 0.3–1.2)
Total Protein: 7.5 g/dL (ref 6.0–8.3)

## 2012-10-31 NOTE — Progress Notes (Signed)
Labs drawn today for cbc/diff,cmp 

## 2012-11-04 ENCOUNTER — Encounter (HOSPITAL_COMMUNITY): Payer: Self-pay | Admitting: Oncology

## 2012-11-04 ENCOUNTER — Encounter (HOSPITAL_BASED_OUTPATIENT_CLINIC_OR_DEPARTMENT_OTHER): Payer: Medicare Other | Admitting: Oncology

## 2012-11-04 VITALS — BP 110/65 | HR 67 | Temp 97.8°F | Resp 18 | Wt 126.0 lb

## 2012-11-04 DIAGNOSIS — Z85118 Personal history of other malignant neoplasm of bronchus and lung: Secondary | ICD-10-CM

## 2012-11-04 DIAGNOSIS — C3490 Malignant neoplasm of unspecified part of unspecified bronchus or lung: Secondary | ICD-10-CM

## 2012-11-04 IMAGING — CR DG CHEST 2V
2 series · 2 of 2 positions shown · non-contrast
Comparison: 12/26/2010

CLINICAL DATA: Lung cancer.  Shortness of breath and pain.

CHEST - 2 VIEW

[w chest pa]
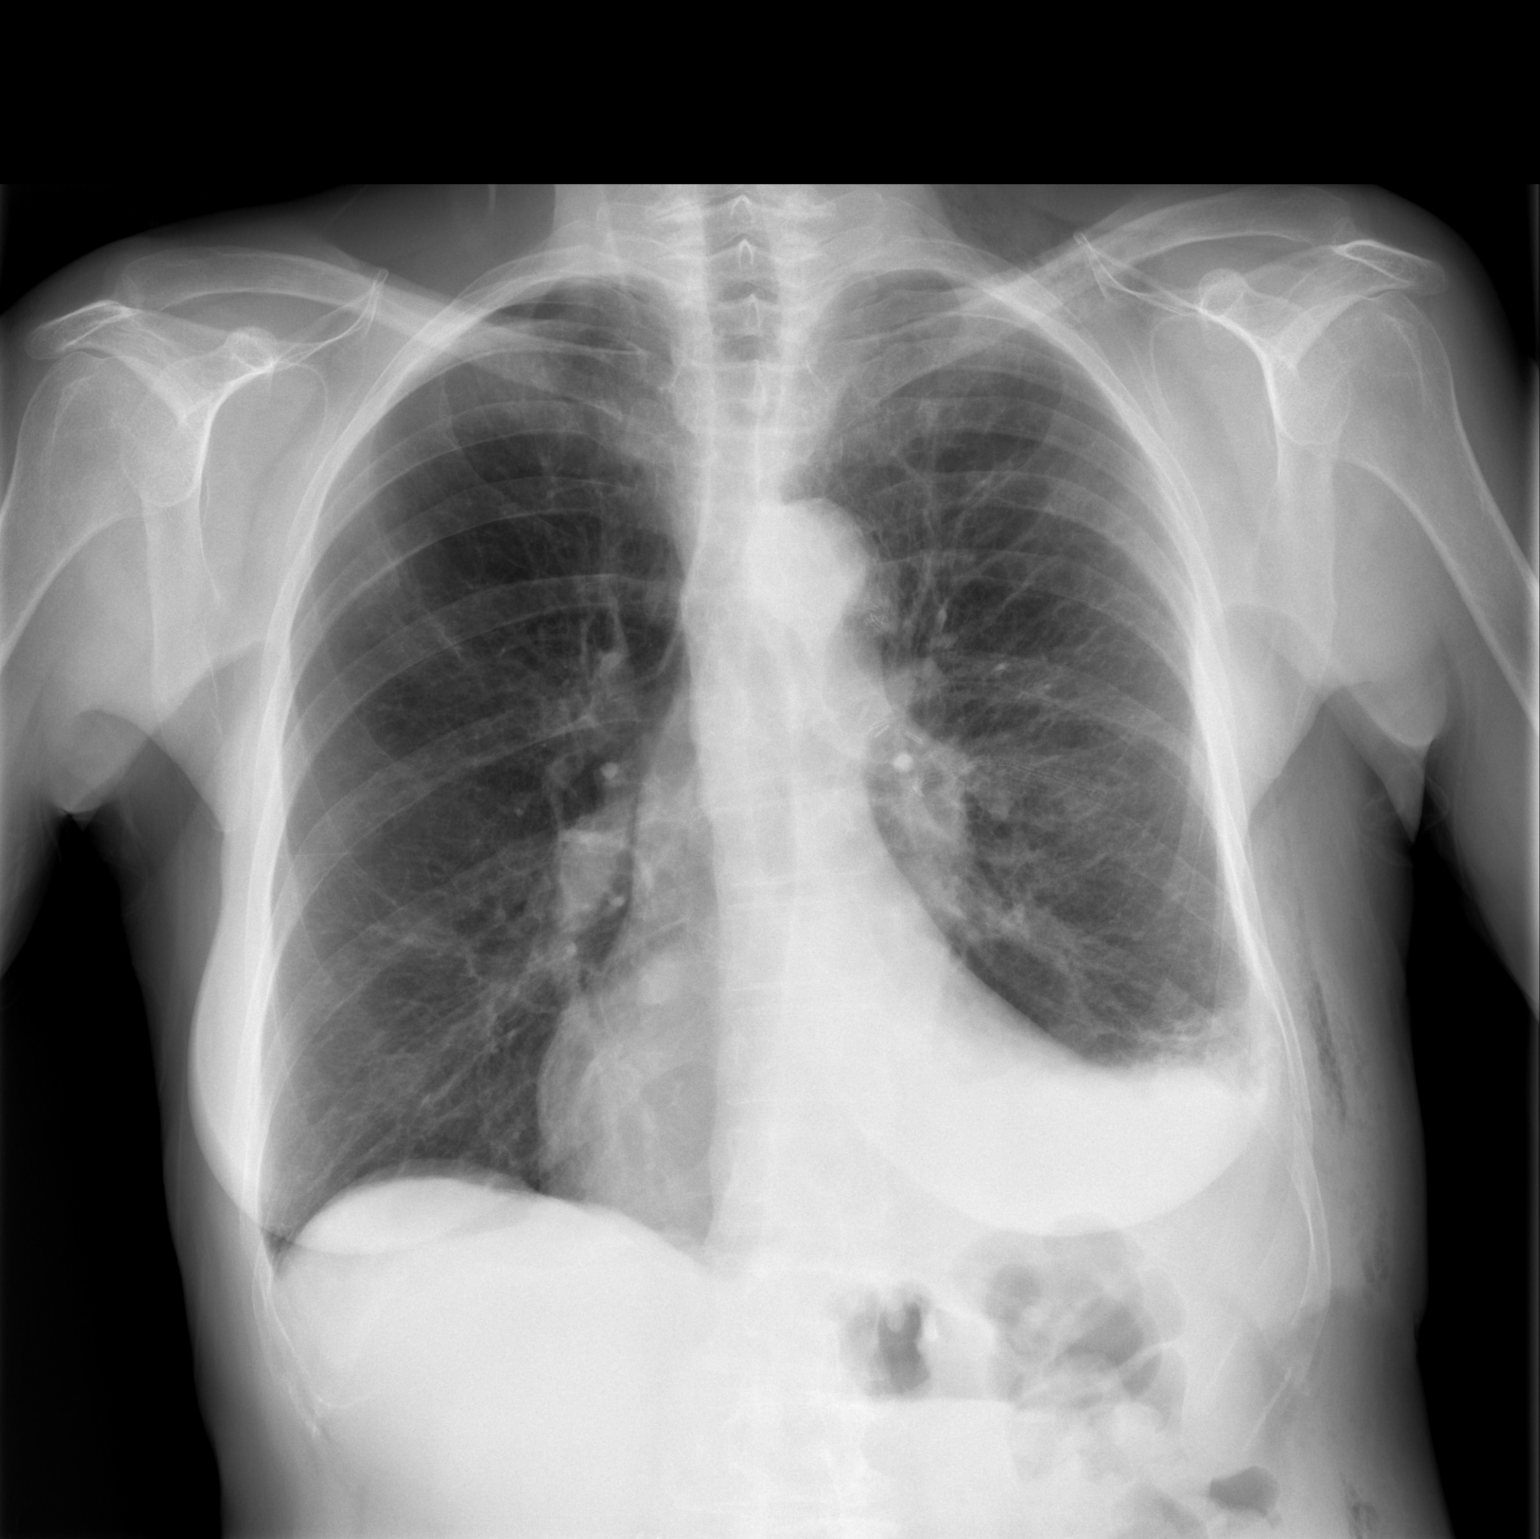

[w chest lat]
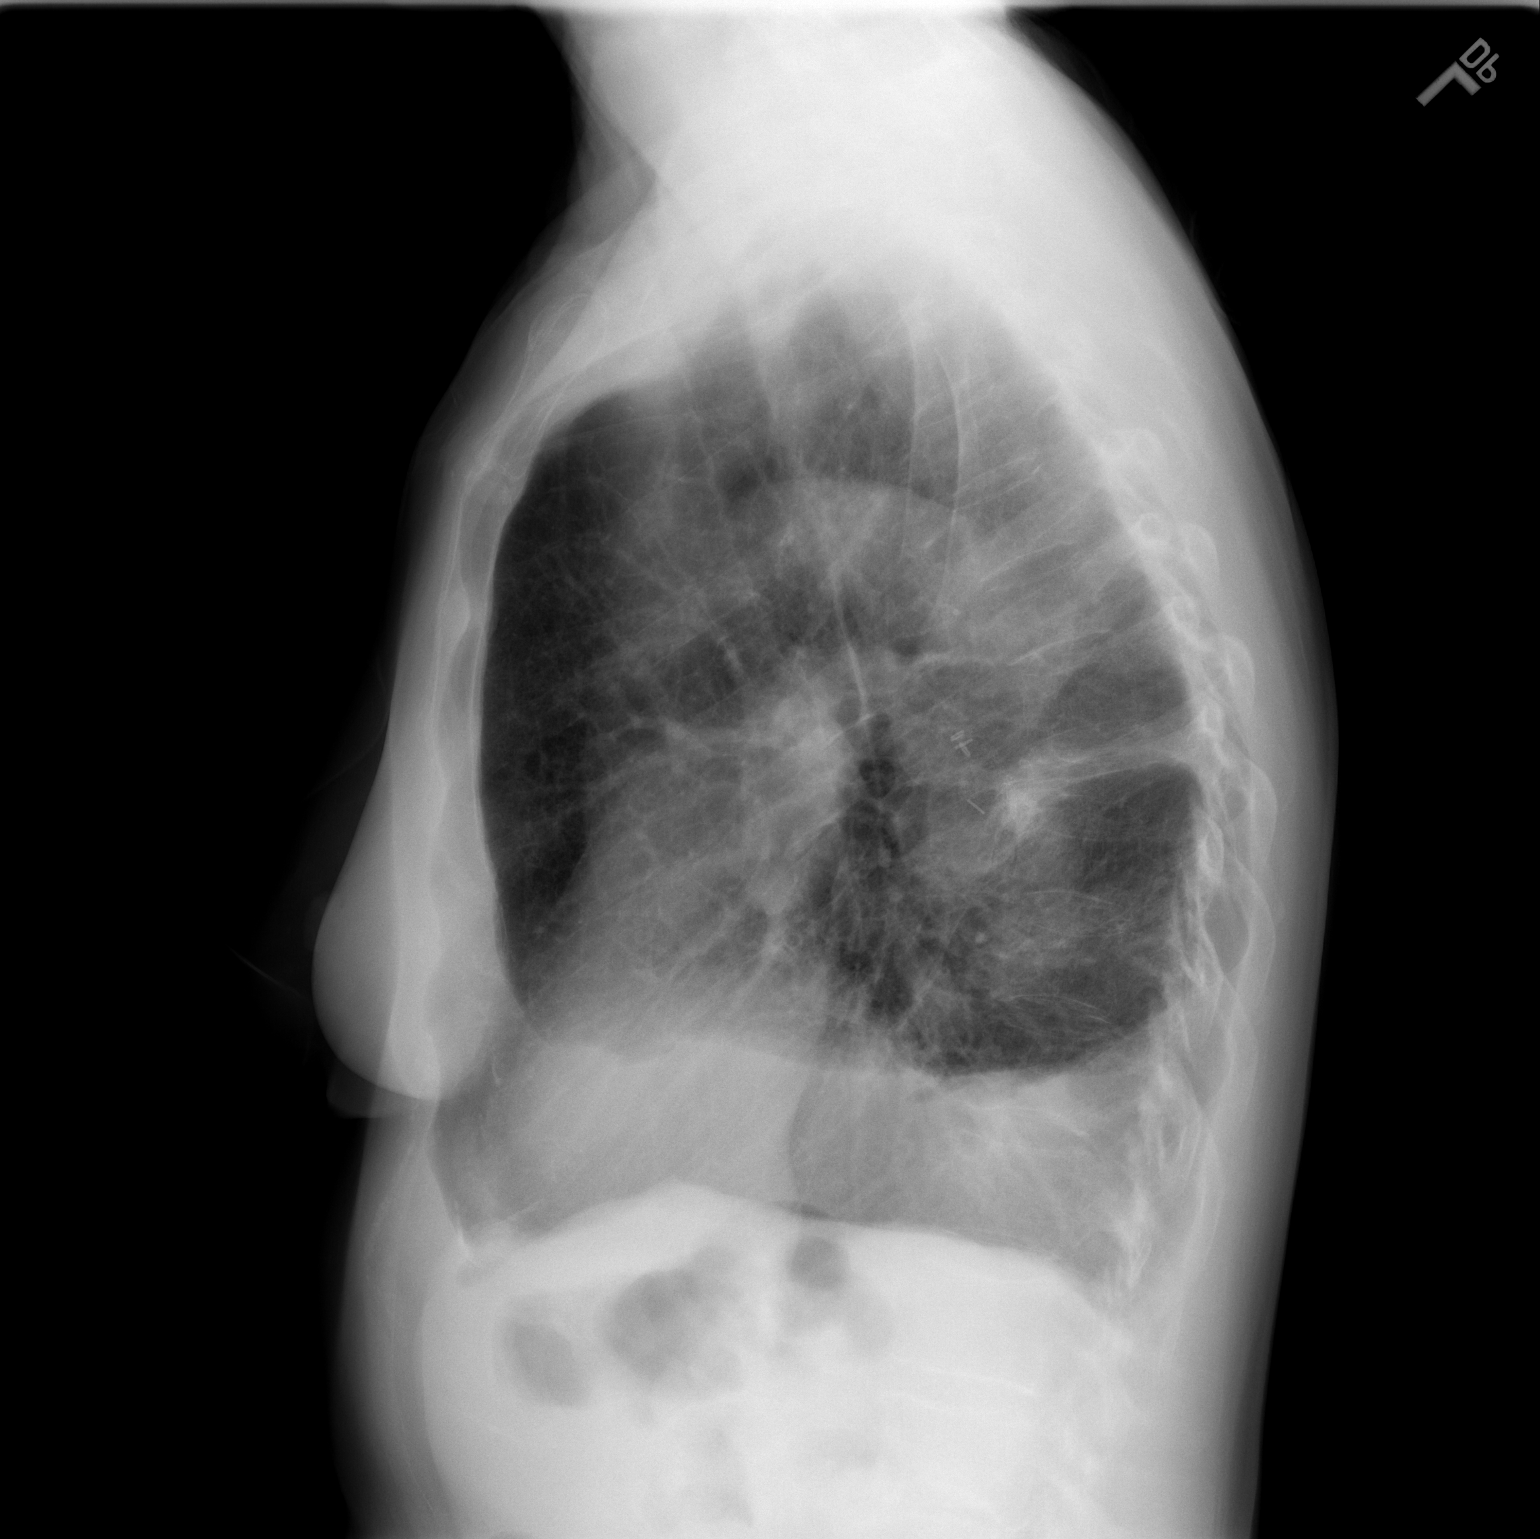

[2 of 2 positions shown; findings below may reference images not displayed]

FINDINGS: Postsurgical changes are seen in the left hemithorax with
surgical clips seen in the hilum.  There is a left pleural effusion
which has increased in size slightly compared to most recent exam.
No pneumothorax is seen.  The right lung is clear and previously
described atelectasis has resolved.  Decreasing subcutaneous
emphysema is noted along the left hemithorax compared to most
recent exam.  The upper abdomen is unchanged.  The bones remain
osteopenic and compression fractures are seen in the spine.
IMPRESSION: Postsurgical changes noted in the left hemithorax with slight
interval increase in the size of the left pleural effusion.  No
pneumothorax is seen and there is decreasing subcutaneous emphysema
along the left chest wall.

## 2012-11-04 NOTE — Patient Instructions (Addendum)
[facility] Discharge Instructions  RECOMMENDATIONS MADE BY THE CONSULTANT AND ANY TEST RESULTS WILL BE SENT TO YOUR REFERRING PHYSICIAN.  EXAM FINDINGS BY THE PHYSICIAN TODAY AND SIGNS OR SYMPTOMS TO REPORT TO CLINIC OR PRIMARY PHYSICIAN: Exam and discussion by MD.  [name] are doing well.  Scans were good.  MEDICATIONS PRESCRIBED:  none  INSTRUCTIONS GIVEN AND DISCUSSED: Report unusual shortness of breath, blood on your sputum, bone pain, etc.  SPECIAL INSTRUCTIONS/FOLLOW-UP: 1 year for labs, CT Scan and to see PA.  Thank you for choosing [facility] to provide your oncology and hematology care.  To afford each patient quality time with our providers, please arrive at least 15 minutes before your scheduled appointment time.  With your help, our goal is to use those 15 minutes to complete the necessary work-up to ensure our physicians have the information they need to help with your evaluation and healthcare recommendations.    Effective January 1st, 2014, we ask that you re-schedule your appointment with our physicians should you arrive 10 or more minutes late for your appointment.  We strive to give you quality time with our providers, and arriving late affects you and other patients whose appointments are after yours.    Again, thank you for choosing [facility].  Our hope is that these requests will decrease the amount of time that you wait before being seen by our physicians.       _____________________________________________________________  Should you have questions after your visit to [facility], please contact our office at [phone] between the hours of 8:30 a.m. and 5:00 p.m.  Voicemails left after 4:30 p.m. will not be returned until the following business day.  For prescription refill requests, have your pharmacy contact our office with your prescription refill request.

## 2012-11-04 NOTE — Progress Notes (Signed)
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 Thus far she has no evidence for recurrent disease or new disease. She had 14 lymph nodes negative and no perineural space invasion with clear margins and no LV I.  Her laboratory work her CAT scan the other day were outstanding. Her oncology review of systems remains negative. Her weight is stable. Her mammography in 2013 was also negative.  BP 110/65  Pulse 67  Temp 97.8 F (36.6 C) (Oral)  Resp 18  Wt 126 lb (57.153 kg)  She is in no acute distress. Lungs still show hyperresonance to percussion but clear breath sounds. She does have diminished breath sounds. Skin exam shows benign seborrheic keratosis left upper shoulder that skin color. She has no lymphadenopathy in the cervical, subclavicular, infraclavicular, axillary or inguinal areas. Breast exam is negative for masses. Heart shows a regular rhythm and rate without murmur rub or gallop. Abdomen remains soft and nontender without hepatosplenomegaly. She has no peripheral edema the arms or legs.  We will bring her back in one year for lab work and a CT scan of the chest and routine followup.

## 2012-11-19 IMAGING — CR DG CHEST 2V
2 series · 2 of 2 positions shown · non-contrast
Comparison: 01/02/2011

CLINICAL DATA: Lung cancer, shortness of breath.

CHEST - 2 VIEW

[view not recorded (1 of 2)]
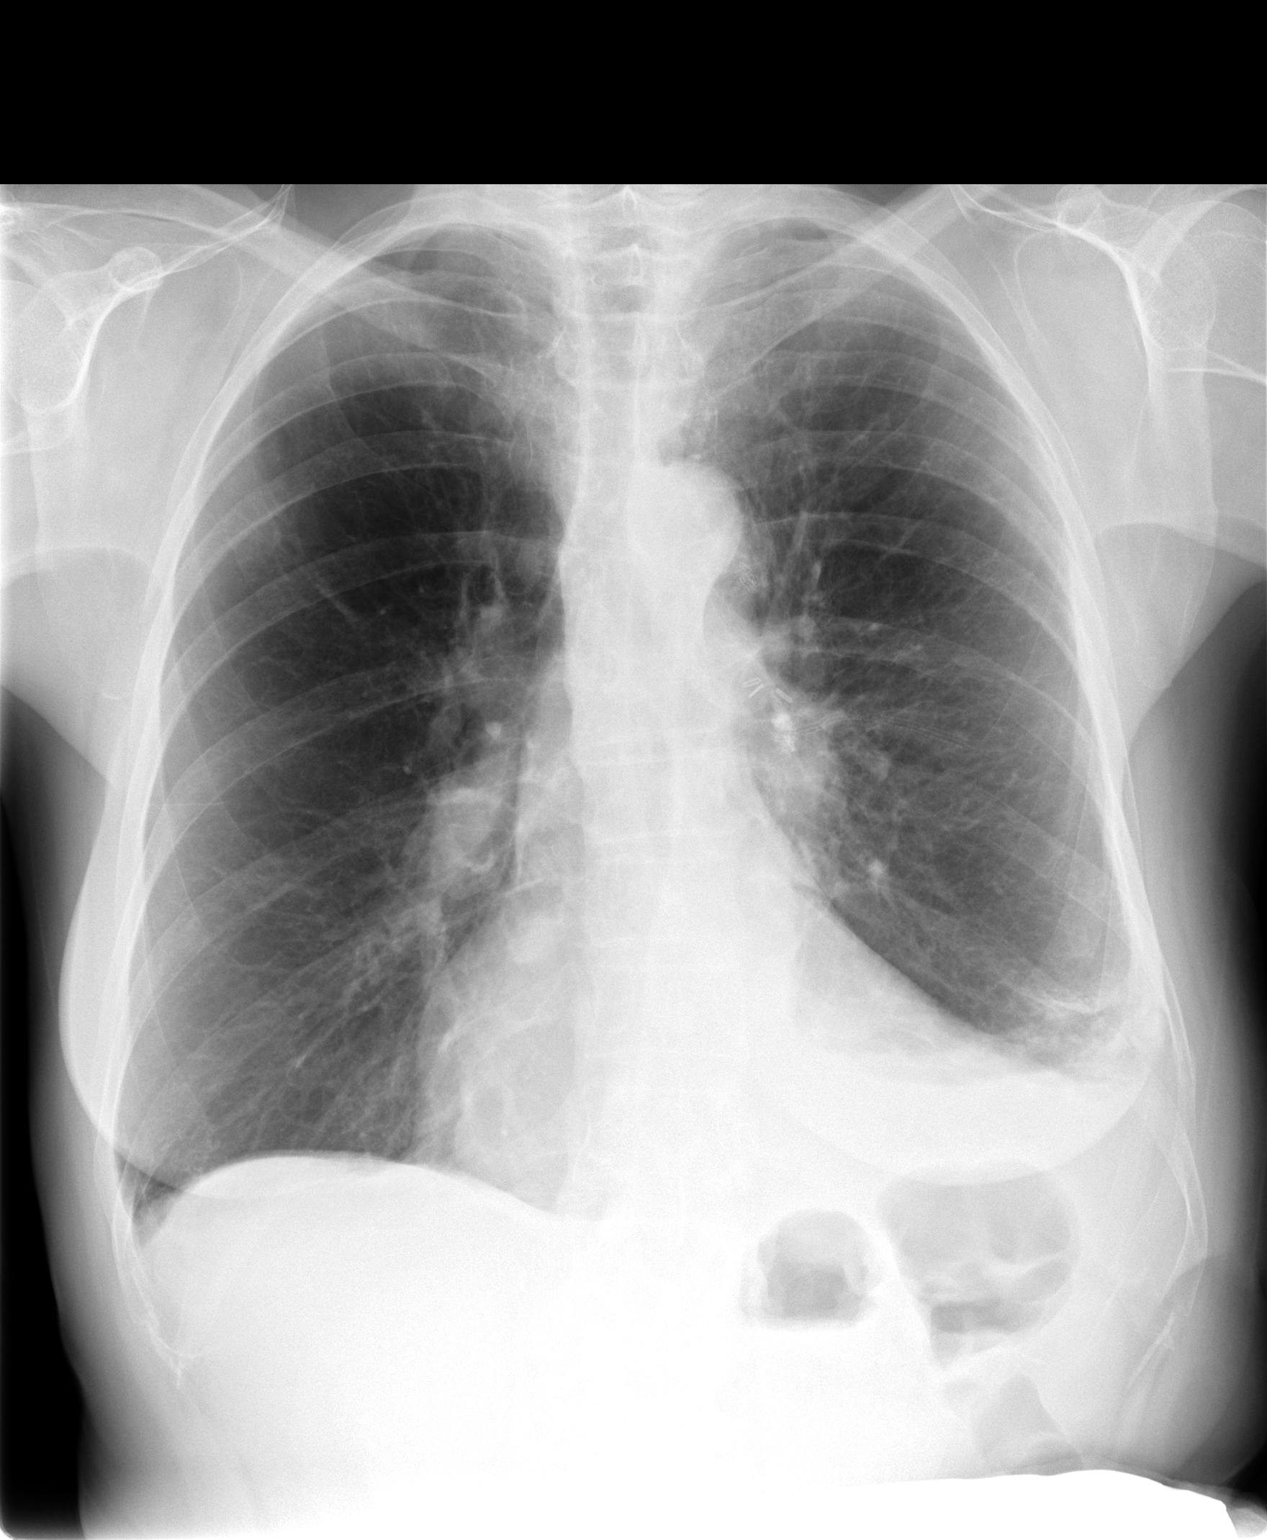

[view not recorded (2 of 2)]
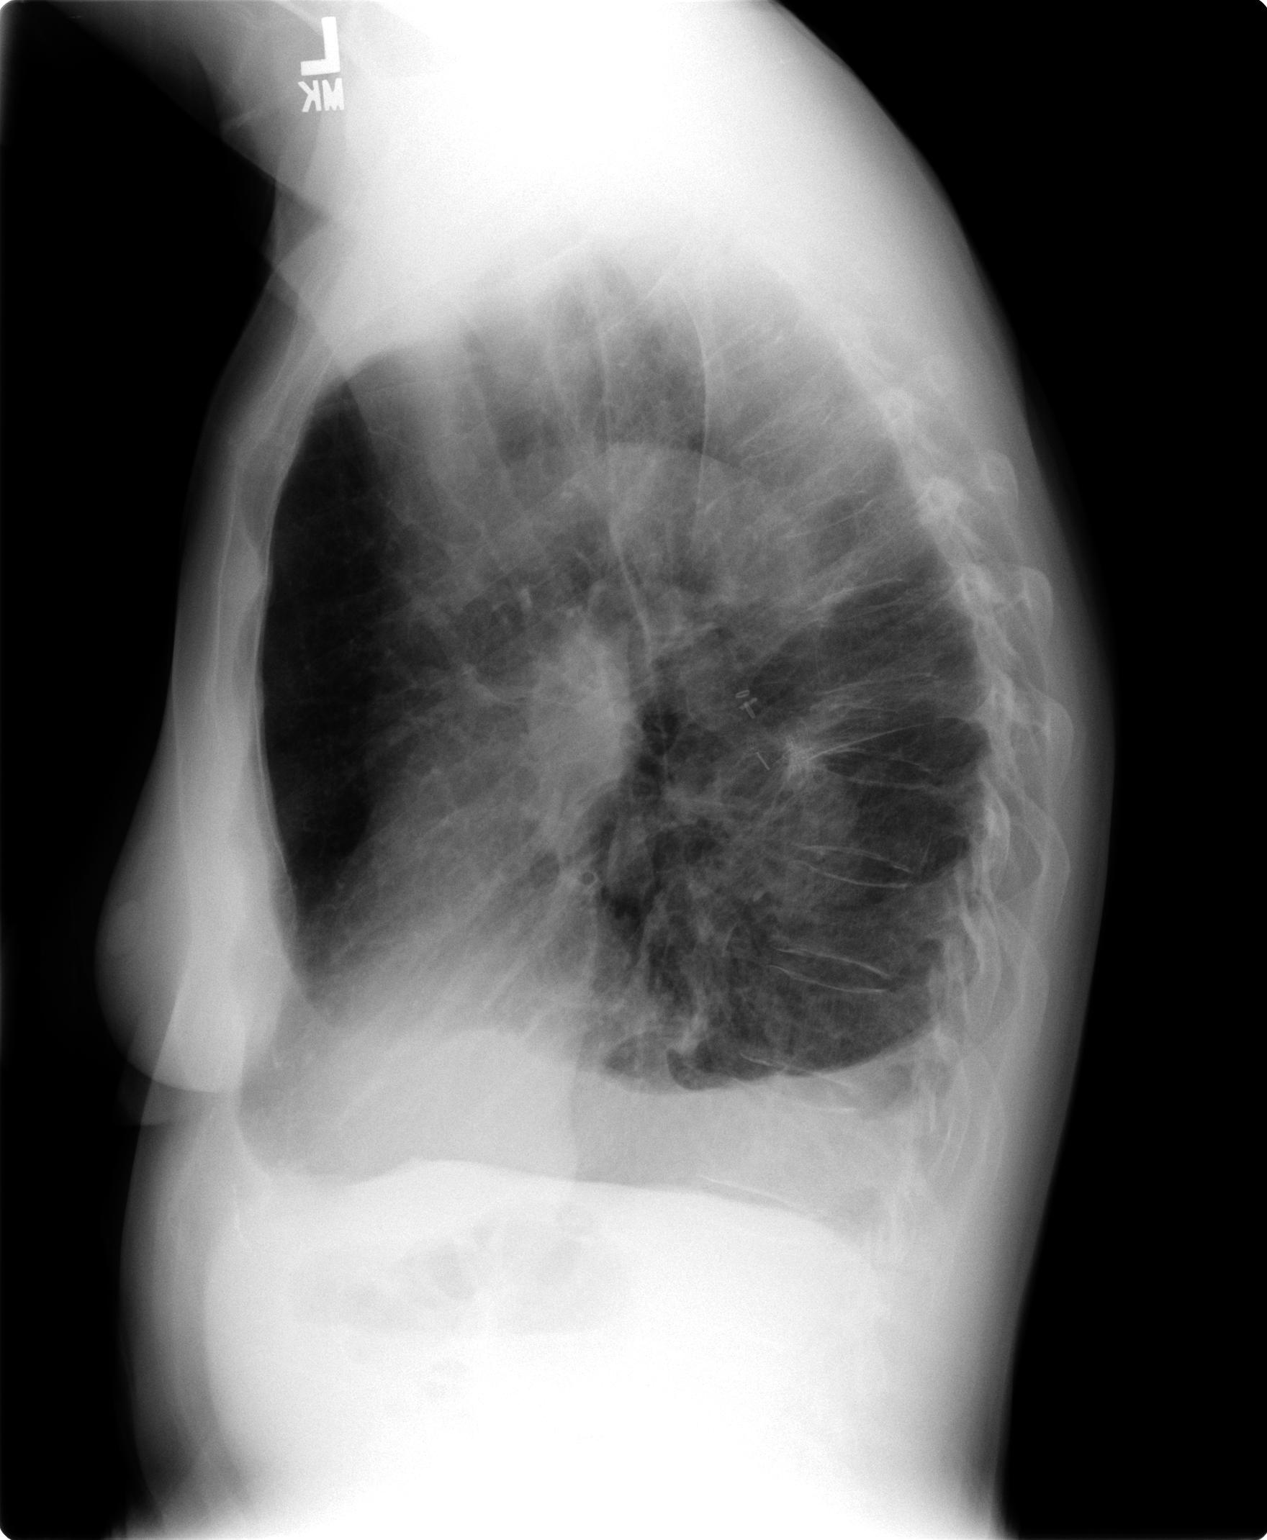

[2 of 2 positions shown; findings below may reference images not displayed]

FINDINGS: Trachea is midline.  Heart size stable.  There are
postoperative changes and volume loss in the left hemithorax.
Small left pleural effusion has decreased slightly in size in the
interval.  Left basilar airspace disease is mild. Mid thoracic
compression fracture again noted.
IMPRESSION: 1.  Small left pleural effusion, slightly decreased from
01/02/2011.
2. Mild left basilar airspace disease, stable.

## 2012-12-04 IMAGING — CR DG CHEST 2V
2 series · 2 of 2 positions shown · non-contrast
Comparison: 01/17/2011

CLINICAL DATA: Preop.

CHEST - 2 VIEW

[view not recorded (1 of 2)]
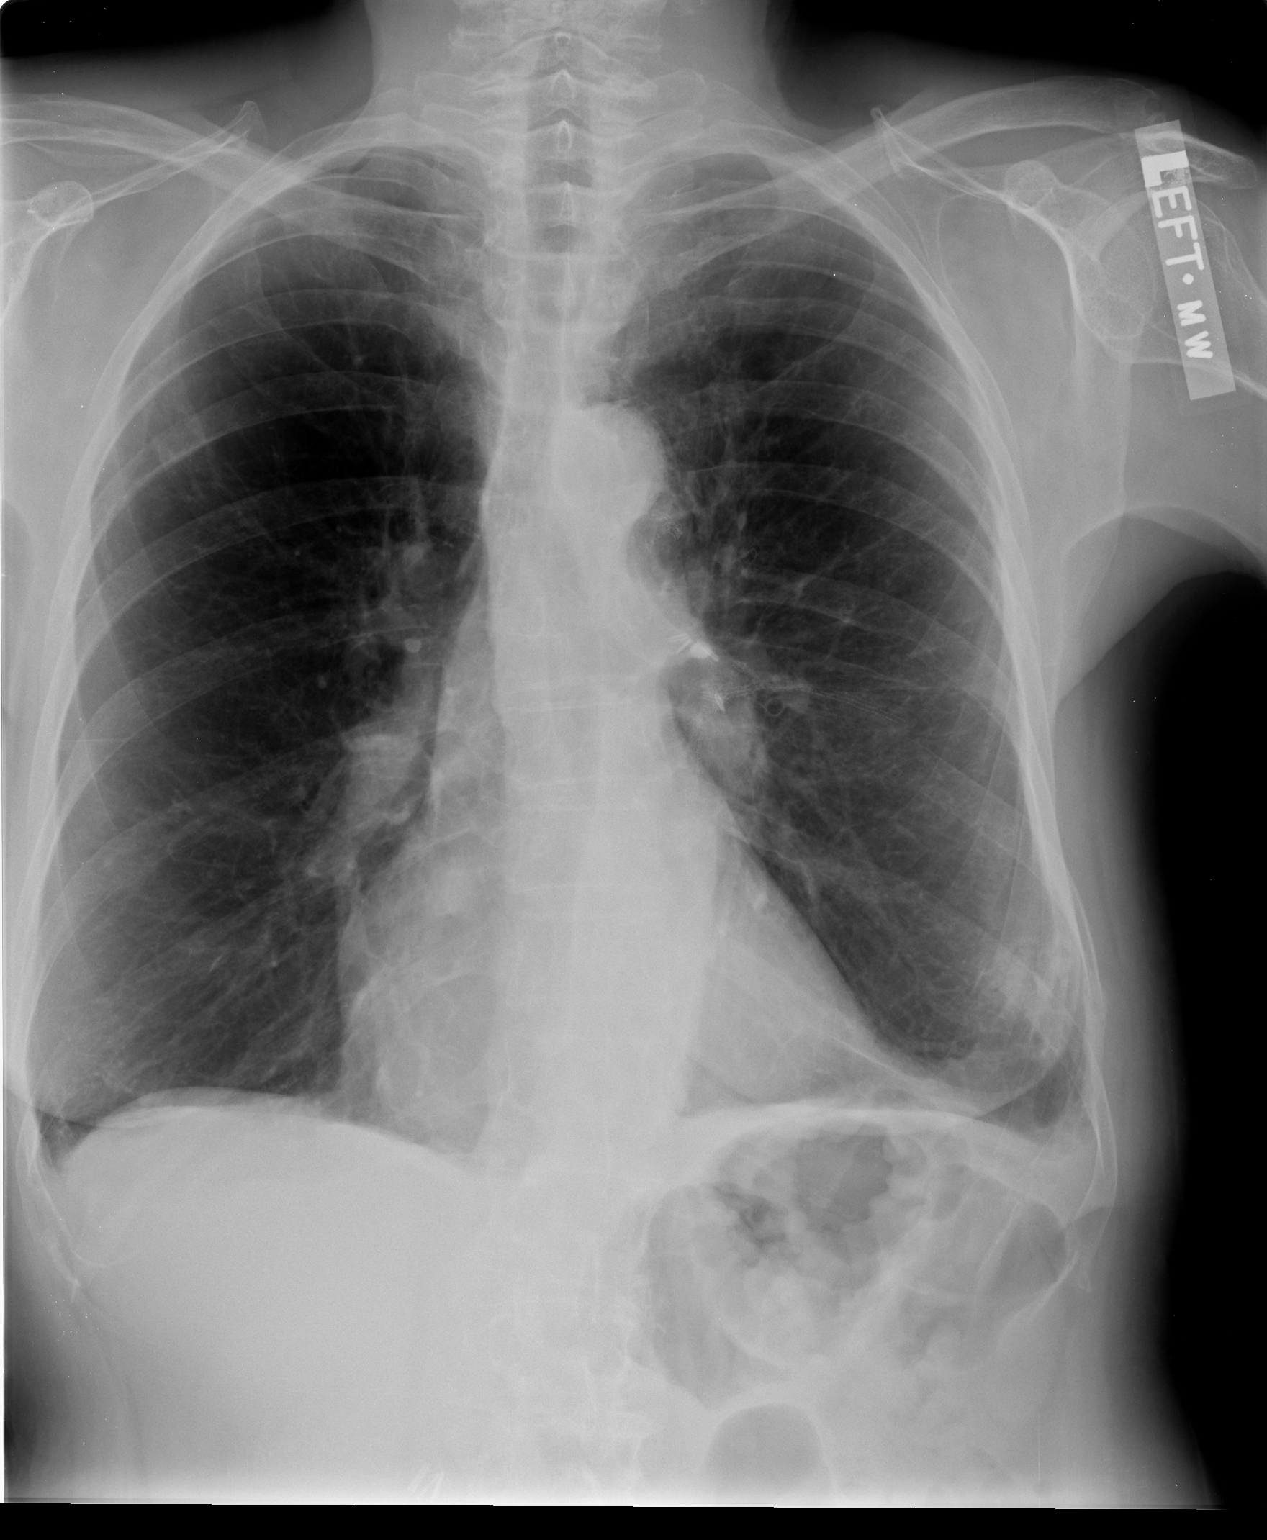

[view not recorded (2 of 2)]
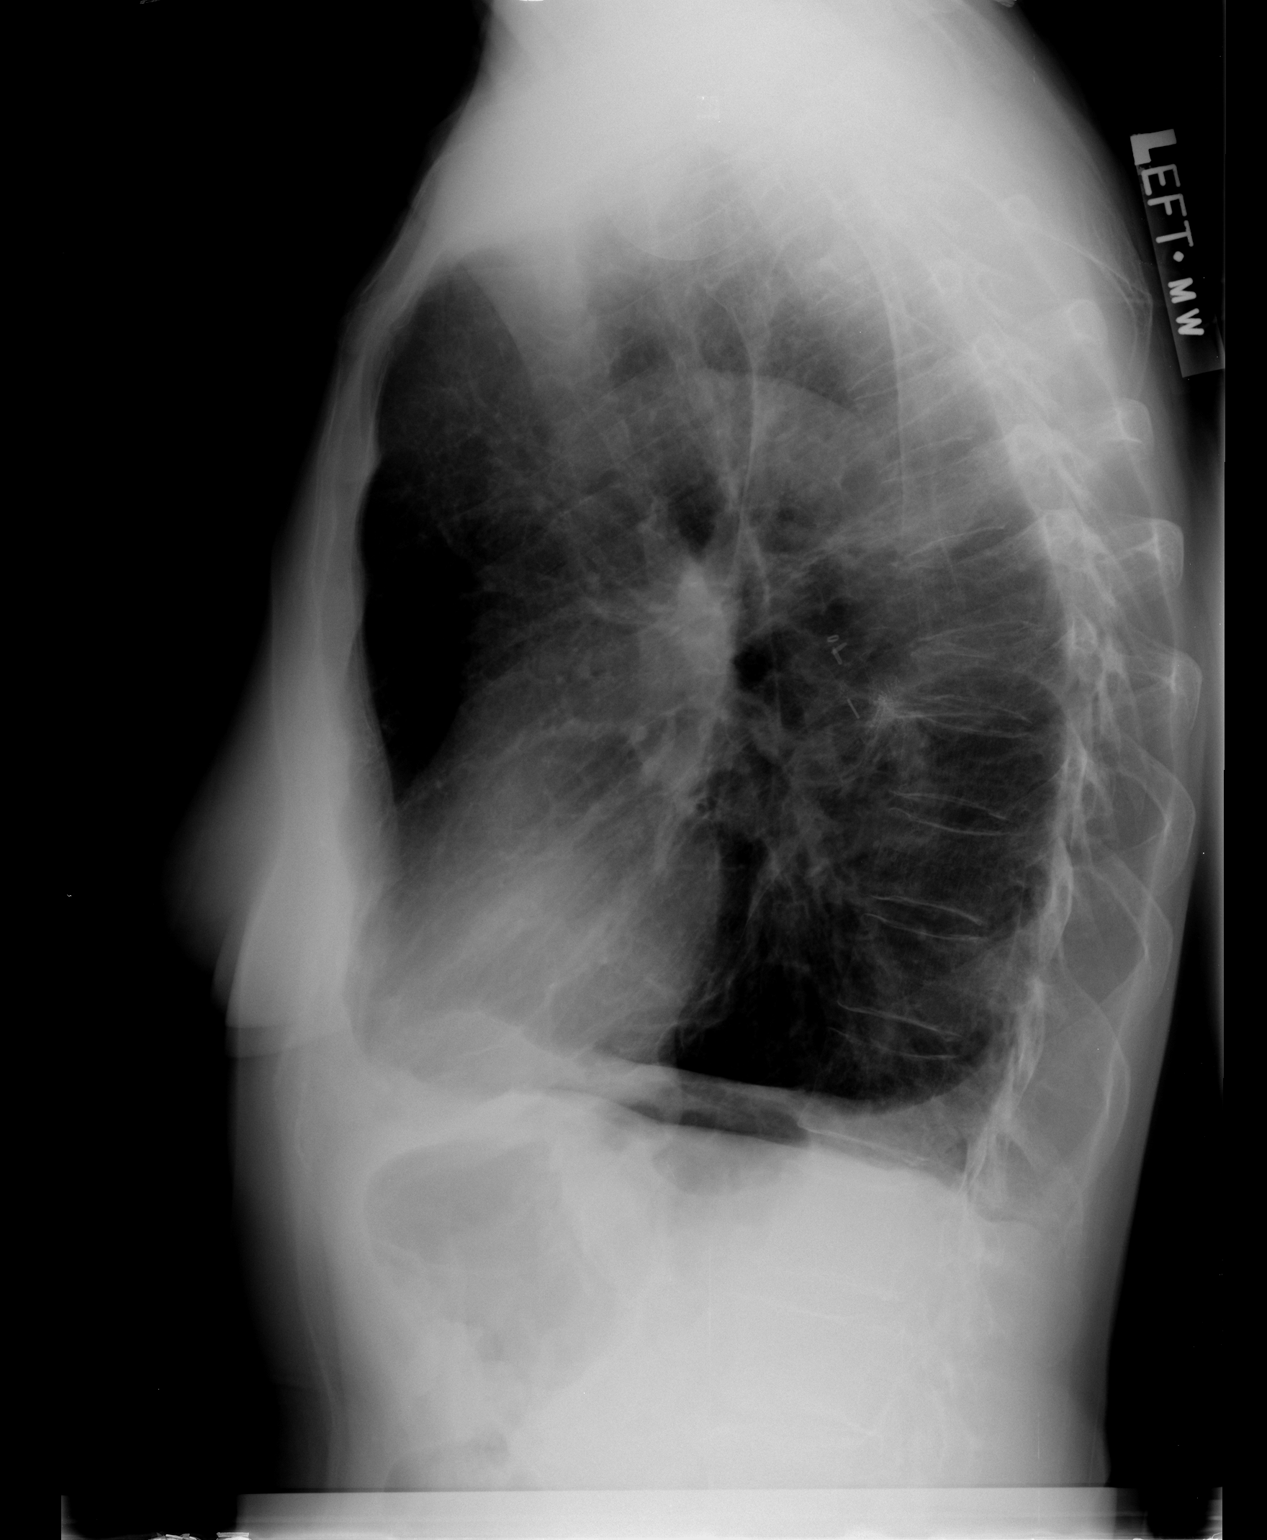

[2 of 2 positions shown; findings below may reference images not displayed]

FINDINGS: Trachea is midline.  Heart size normal.  There are
postoperative changes and volume loss in the left hemithorax, with
some residual atelectasis and pleural fluid at the left
costophrenic angle. Emphysema.  Mid thoracic fashion fracture
appears stable.
IMPRESSION: Postoperative changes in the left hemithorax with residual
atelectasis and pleural fluid at the left costophrenic angle.
Developing pleural parenchymal scarring is also considered.

## 2012-12-17 IMAGING — CR DG CHEST 2V
2 series · 2 of 2 positions shown · non-contrast
Comparison: [HOSPITAL] nuclear medicine PET-CT
11/13/2010, [HOSPITAL] chest x-ray 02/01/2011.

CLINICAL DATA: Left lower lobe cancer post surgery [DATE].

CHEST - 2 VIEW

[view not recorded (1 of 2)]
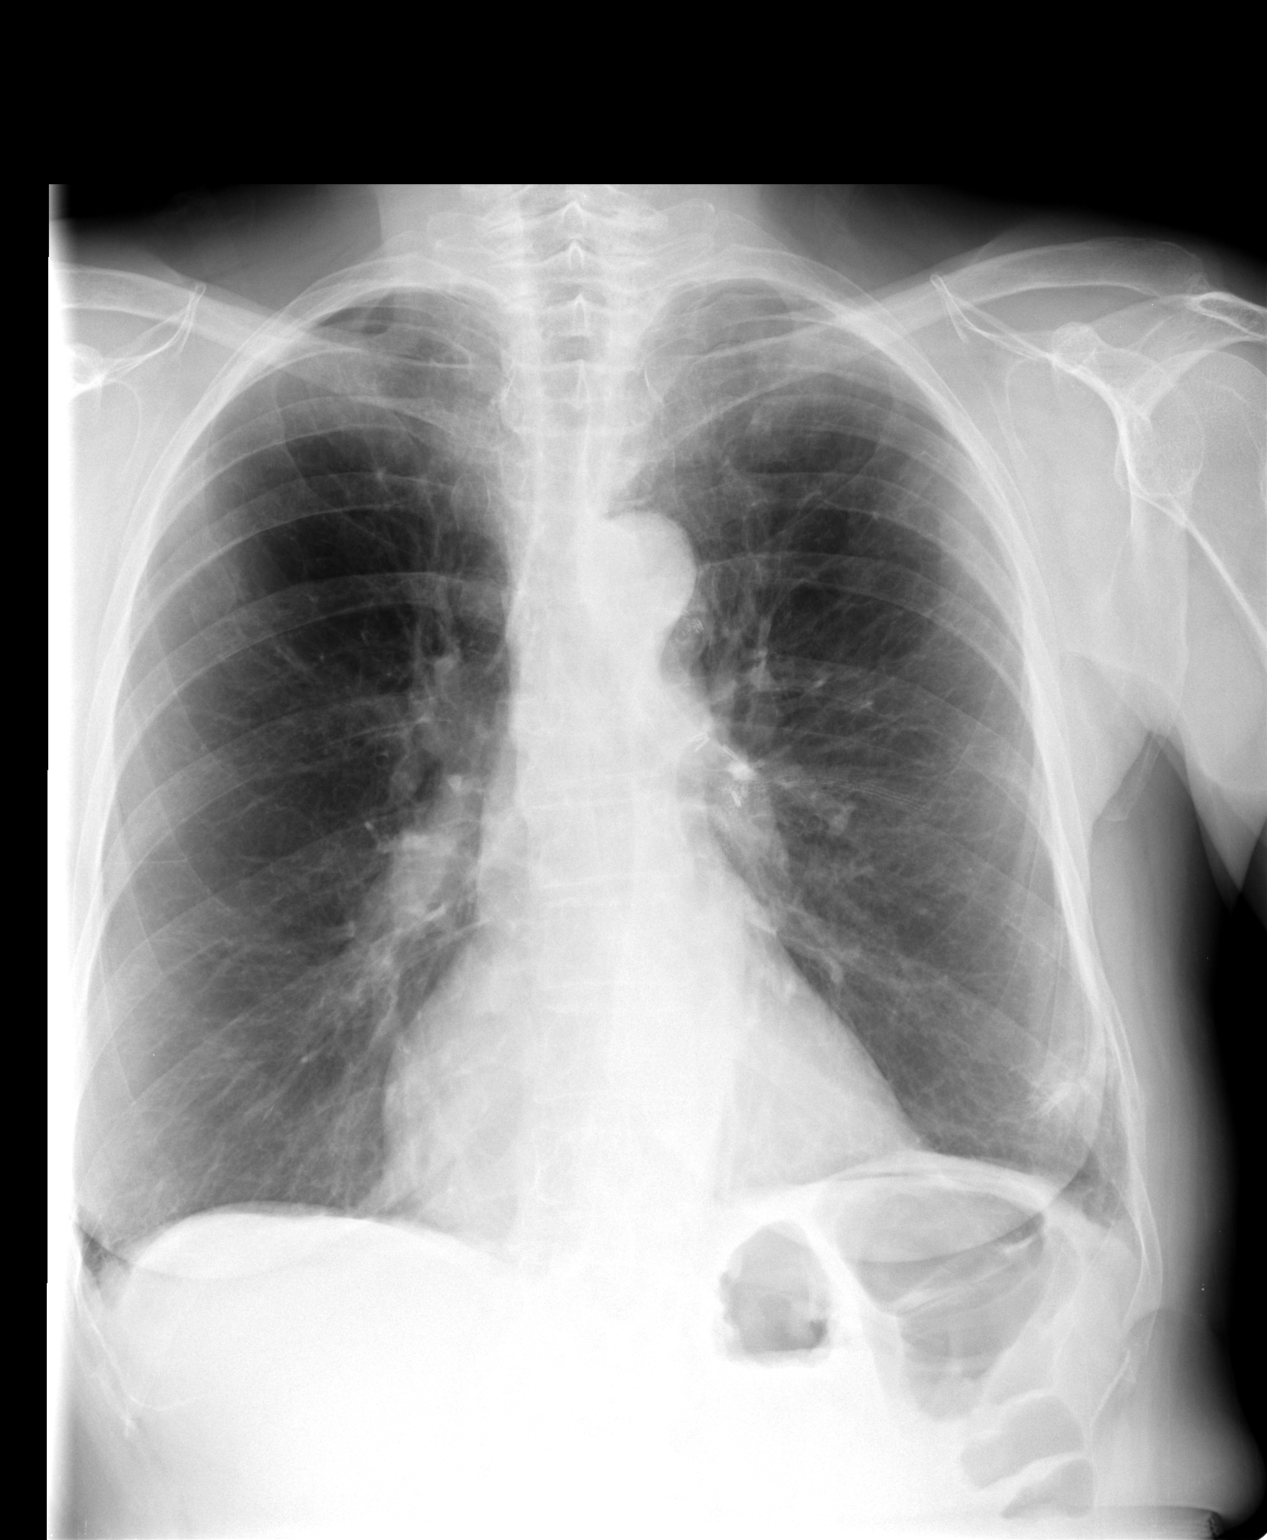

[view not recorded (2 of 2)]
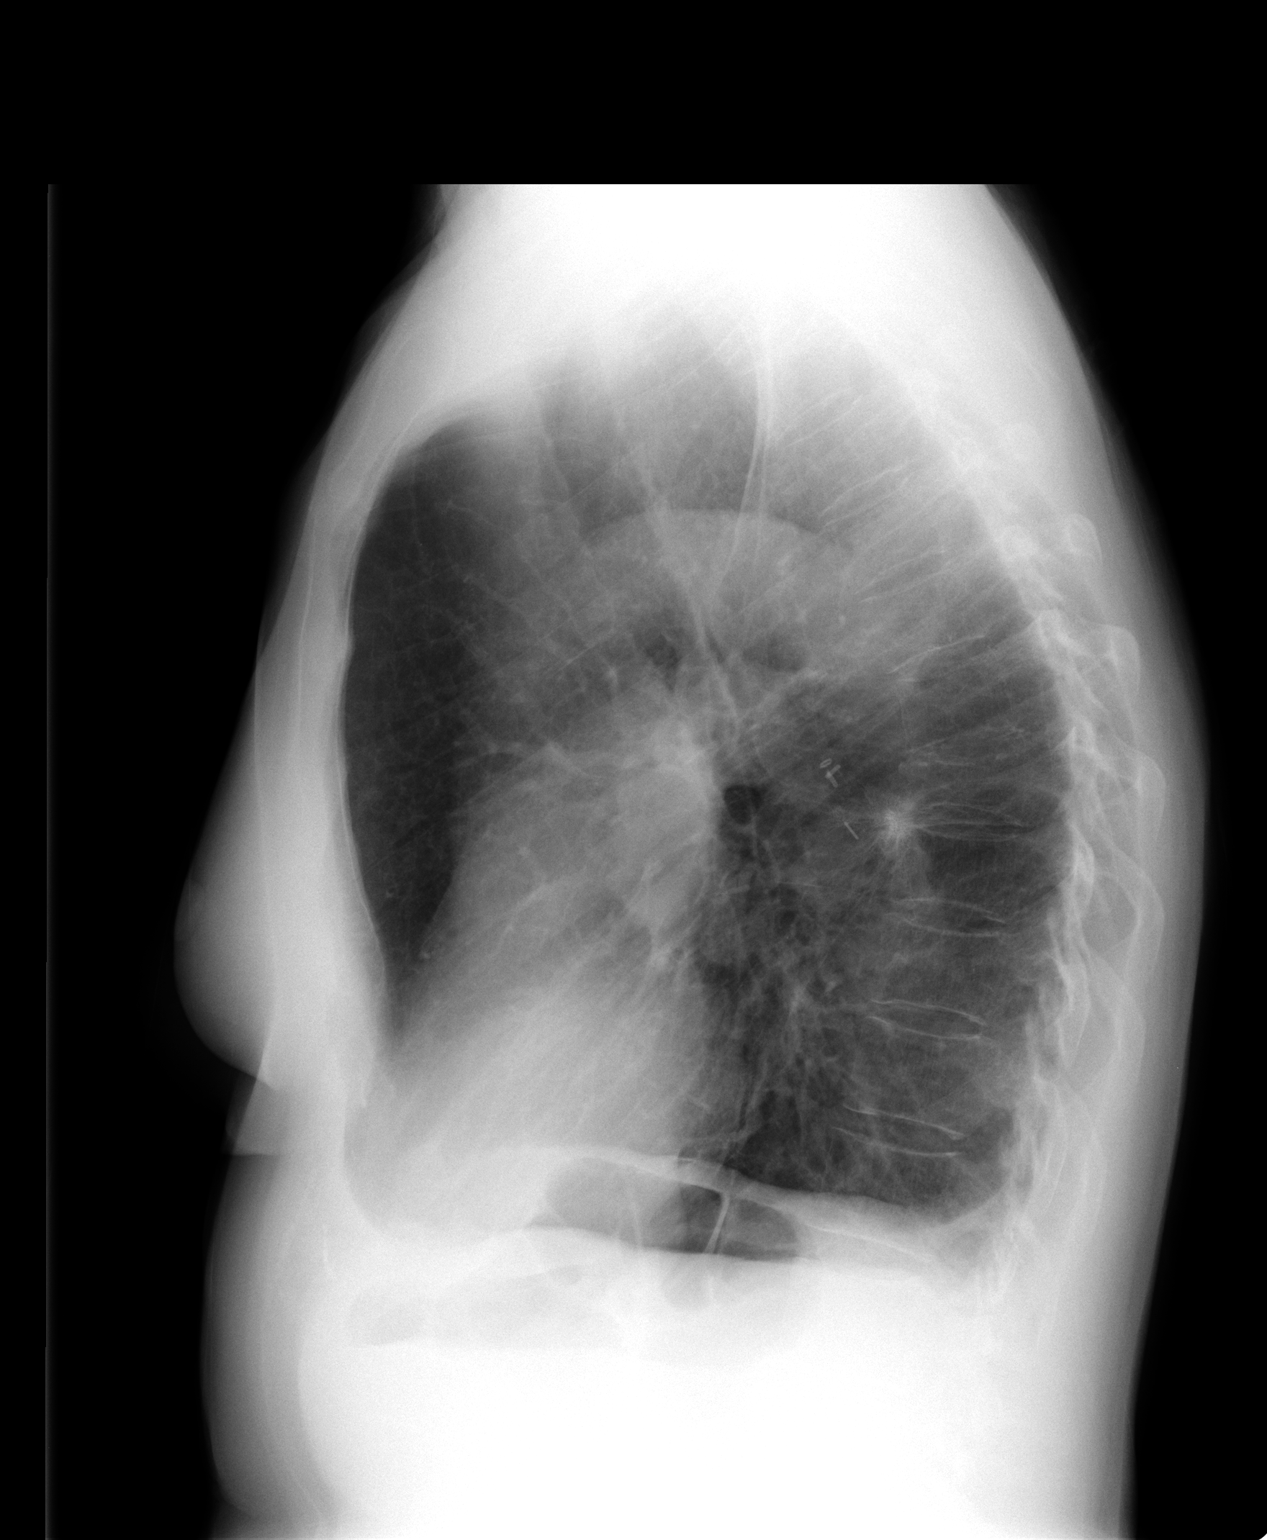

[2 of 2 positions shown; findings below may reference images not displayed]

FINDINGS: Stable left lower lobe postsurgical change with slight
linear scarring lateral left lung base and focal scarring in region
of previous lesion superior segment left lower lobe.

Stable emphysema.

Lungs otherwise clear.

Stable heart size upper limits of normal.

Mediastinum, hila, remaining pleura, dorsal osteopenia with slight
compression fracture mid dorsal vertebra without retropulsion,
slight pectus excavatum are otherwise unremarkable.
IMPRESSION: 1.  Since 02/01/2011, stable left lower lobe postsurgical change
and two areas of scarring (left lateral lung base and previous
superior segment left lower lobe region).
2.  Stable emphysema.
3.  No additional acute or metastatic disease findings.

## 2012-12-30 IMAGING — RF DG UGI W/ HIGH DENSITY W/KUB
16 of 24 series · 16 of 24 positions shown · non-contrast
Comparison: None

CLINICAL DATA: Epigastric pain, history ulcer disease, mid chest
pain, question mid thoracic esophageal dysphagia

UPPER GI SERIES WITH AIR/HIGH DENSITY BARIUM AND KUB:
TECHNIQUE: Routine air contrast upper GI series performed following ingestion
of effervescent granules and high density barium.  Scout image was
performed.
Fluoroscopy time:  2.7 minutes

[Series 1: run · 1 of 1 slices shown (1 of 16)]
[im 1/1]
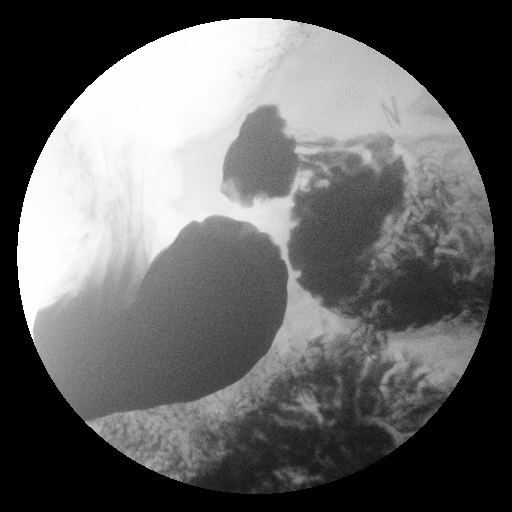

[Series 3: run · 1 of 1 slices shown (2 of 16)]
[im 1/1]
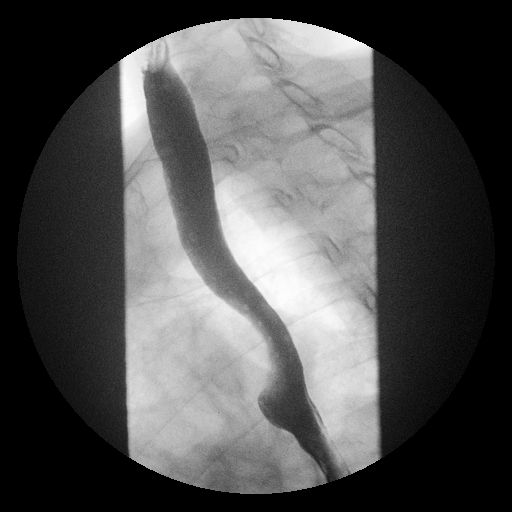

[Series 4: run · 1 of 1 slices shown (3 of 16)]
[im 1/1]
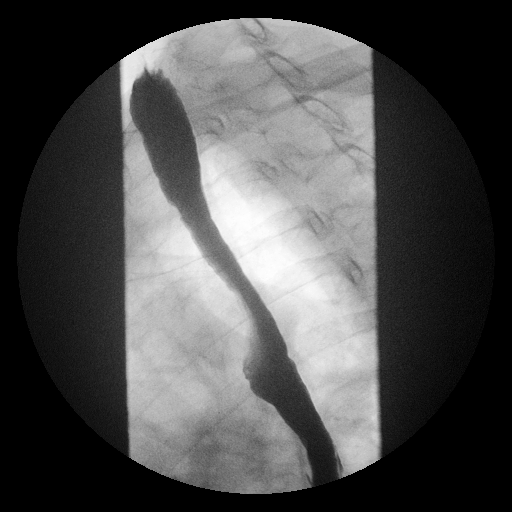

[Series 6: run · 1 of 1 slices shown (4 of 16)]
[im 1/1]
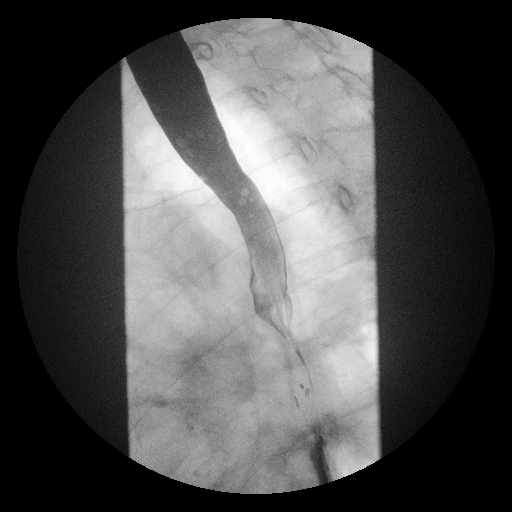

[Series 7: run · 1 of 1 slices shown (5 of 16)]
[im 1/1]
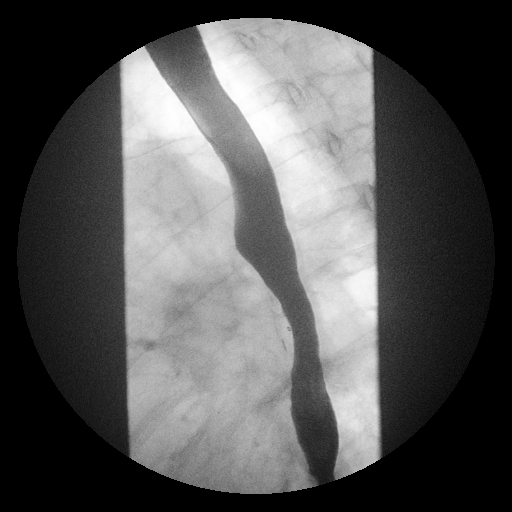

[Series 9: run · 1 of 1 slices shown (6 of 16)]
[im 1/1]
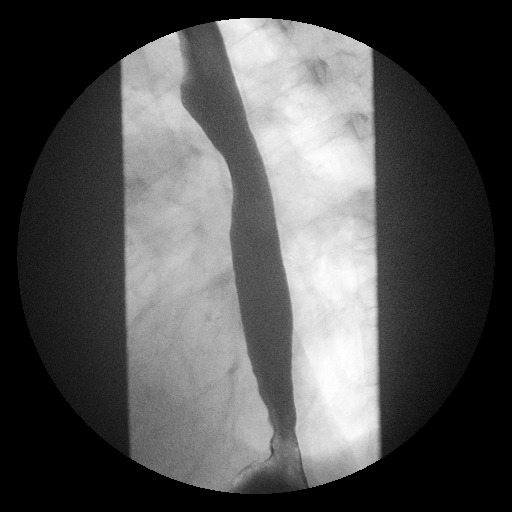

[Series 10: run · 1 of 1 slices shown (7 of 16)]
[im 1/1]
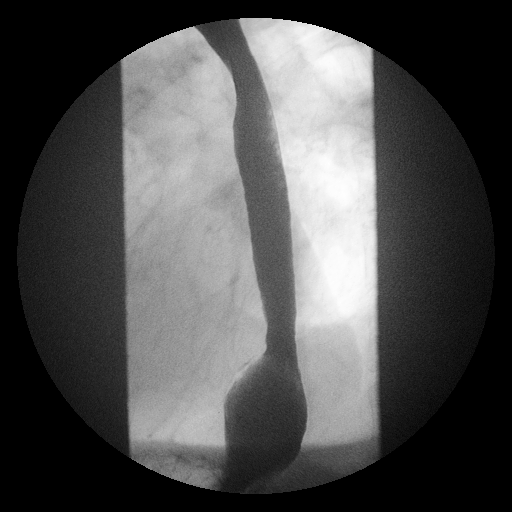

[Series 12: run · 1 of 1 slices shown (8 of 16)]
[im 1/1]
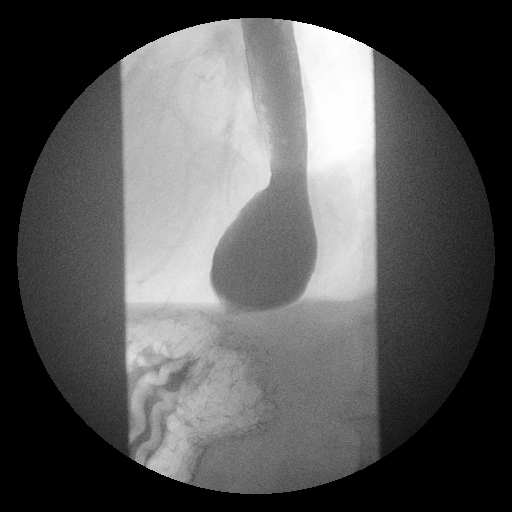

[Series 13: run · 1 of 1 slices shown (9 of 16)]
[im 1/1]
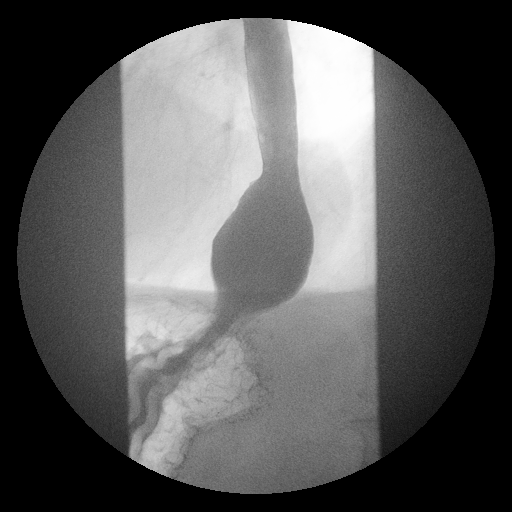

[Series 15: run · 1 of 1 slices shown (10 of 16)]
[im 1/1]
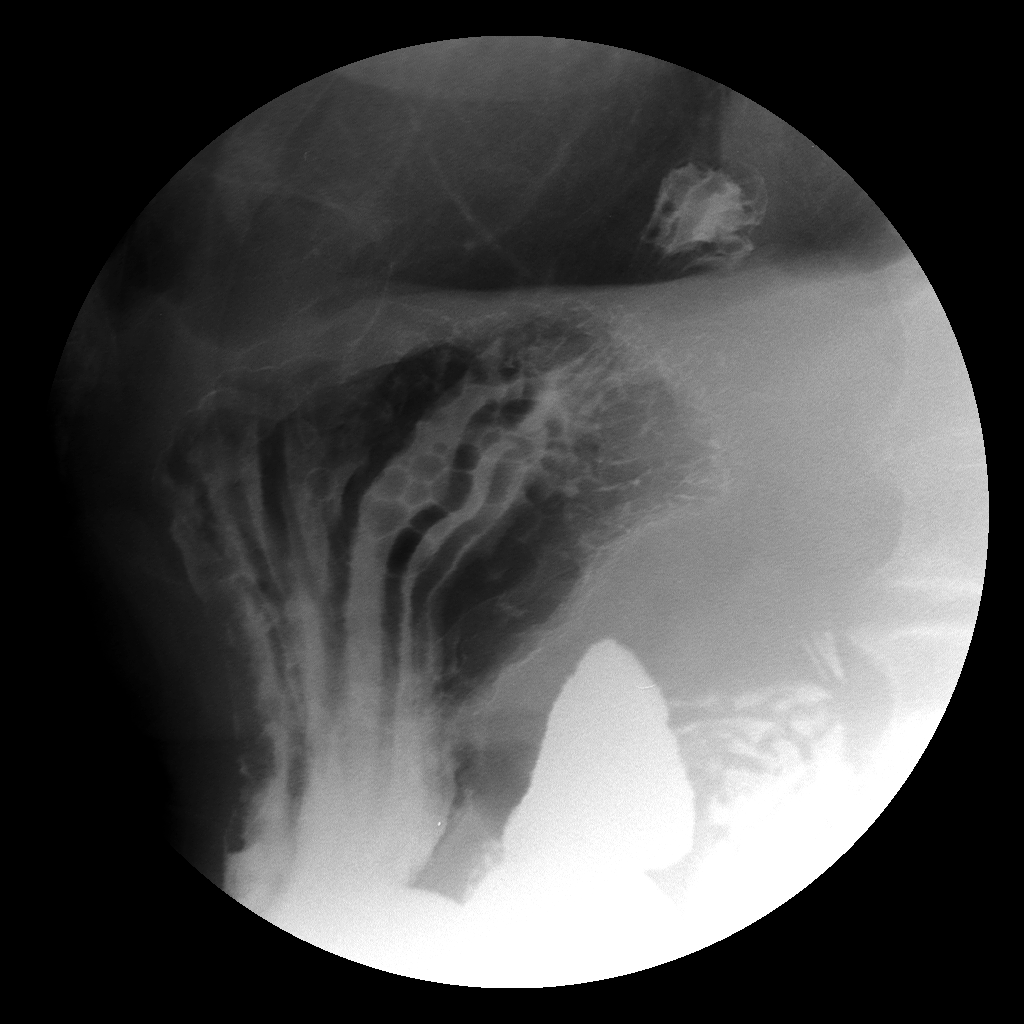

[Series 16: run · 1 of 1 slices shown (11 of 16)]
[im 1/1]
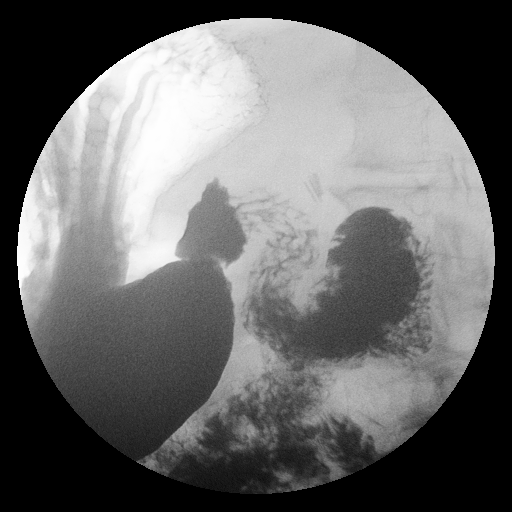

[Series 18: run · 1 of 1 slices shown (12 of 16)]
[im 1/1]
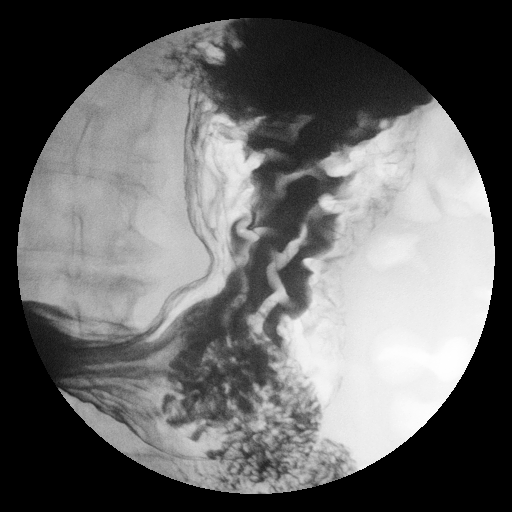

[Series 19: run · 1 of 1 slices shown (13 of 16)]
[im 1/1]
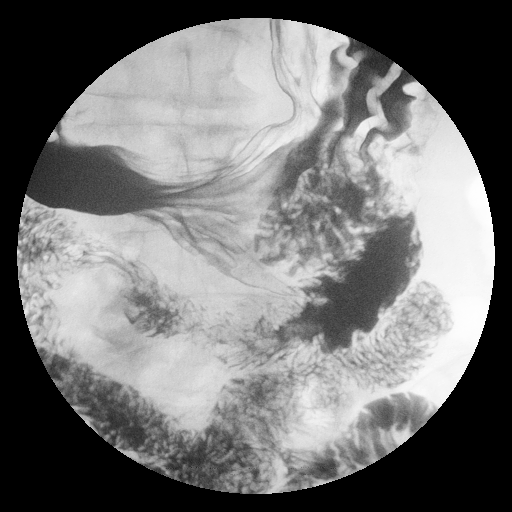

[Series 21: run · 1 of 1 slices shown (14 of 16)]
[im 1/1]
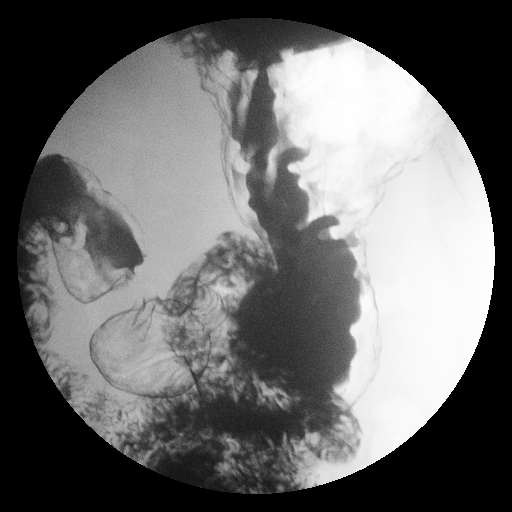

[Series 22: run · 1 of 1 slices shown (15 of 16)]
[im 1/1]
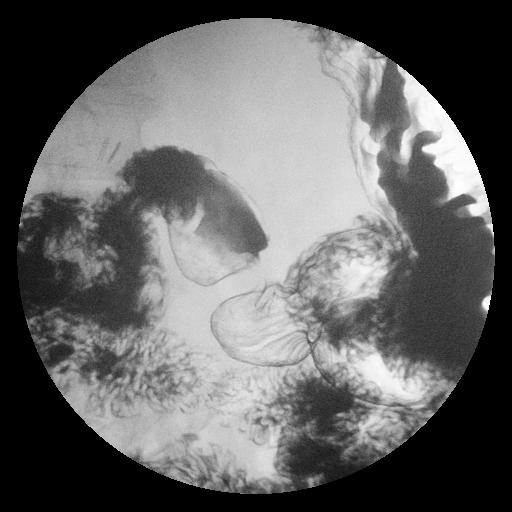

[Series 24: run · 1 of 1 slices shown (16 of 16)]
[im 1/1]
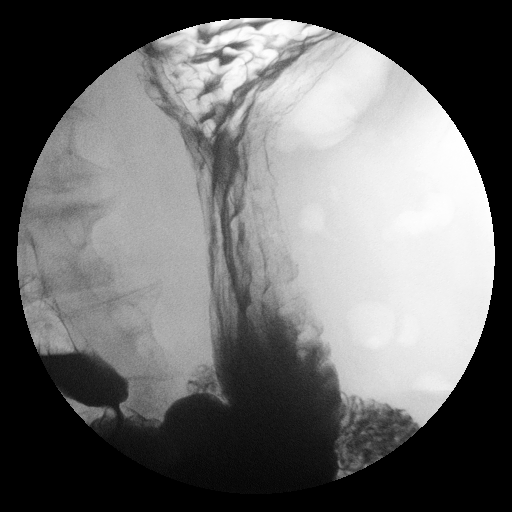

[16 of 24 positions shown; findings below may reference images not displayed]

FINDINGS: Normal esophageal distention and motility.
No esophageal mass or stricture.
Few scattered tertiary and secondary waves noted.
12.5 mm diameter barium tablet passes from oral cavity stomach
without delay.
Small sliding hiatal hernia noted.
Stomach distends with normal rugal fold pattern.
No gastric mass or ulcer ulceration definitely visualized.
No gastric outlet obstruction.
Duodenal bulb and sweep normal appearance.
Visualized jejunal loops unremarkable.
No gastroesophageal reflux identified during exam.
Targeted rapid sequence imaging of the cervical esophagus and
hypopharynx shows no laryngeal penetration or aspiration.
IMPRESSION: Tiny sliding hiatal hernia.
Otherwise negative exam.

## 2013-02-17 IMAGING — CR DG CHEST 2V
2 series · 2 of 2 positions shown · non-contrast
Comparison: Chest x-ray of 02/14/2011

CLINICAL DATA: Left lung surgery in December 2010, follow-up

CHEST - 2 VIEW

[w chest pa]
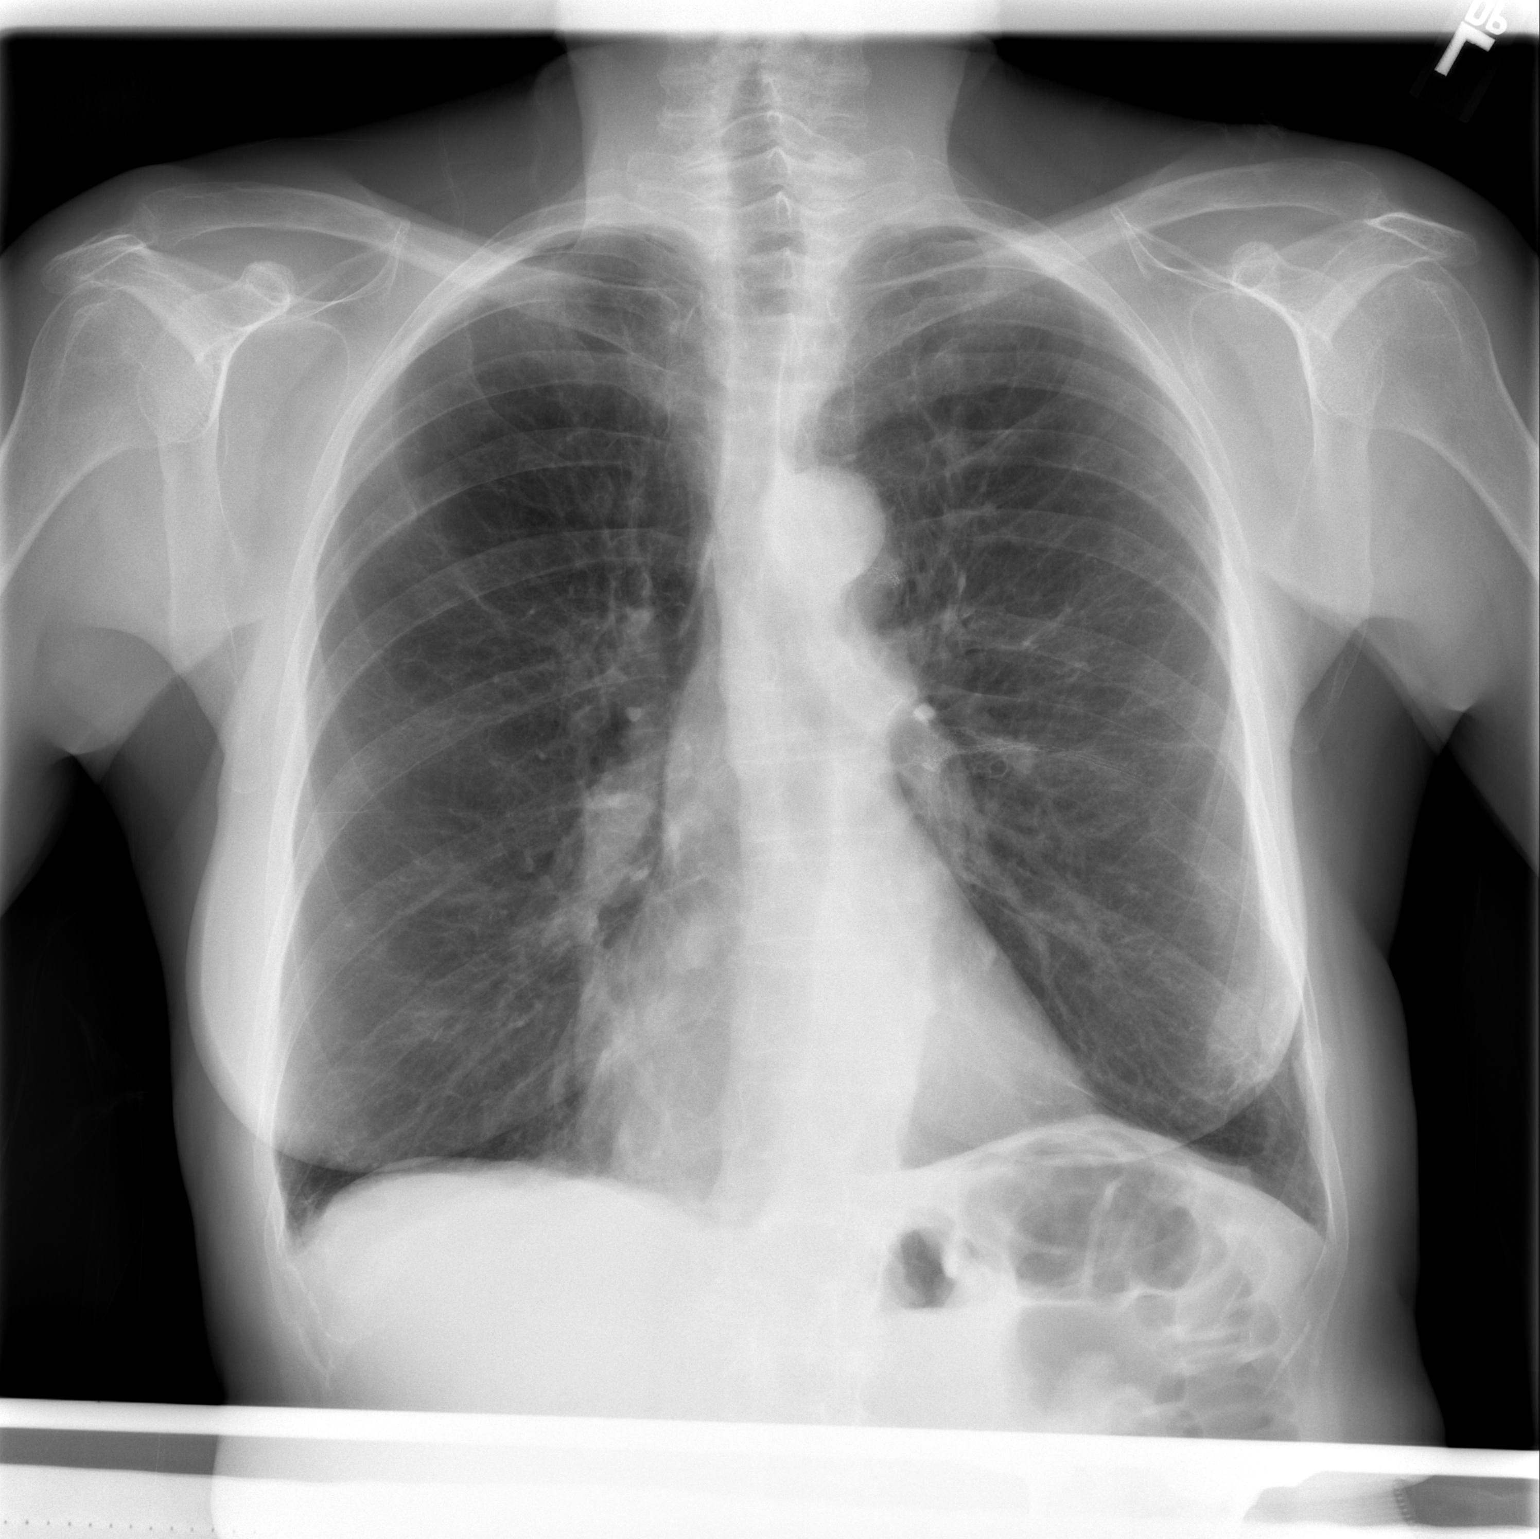

[w chest lat]
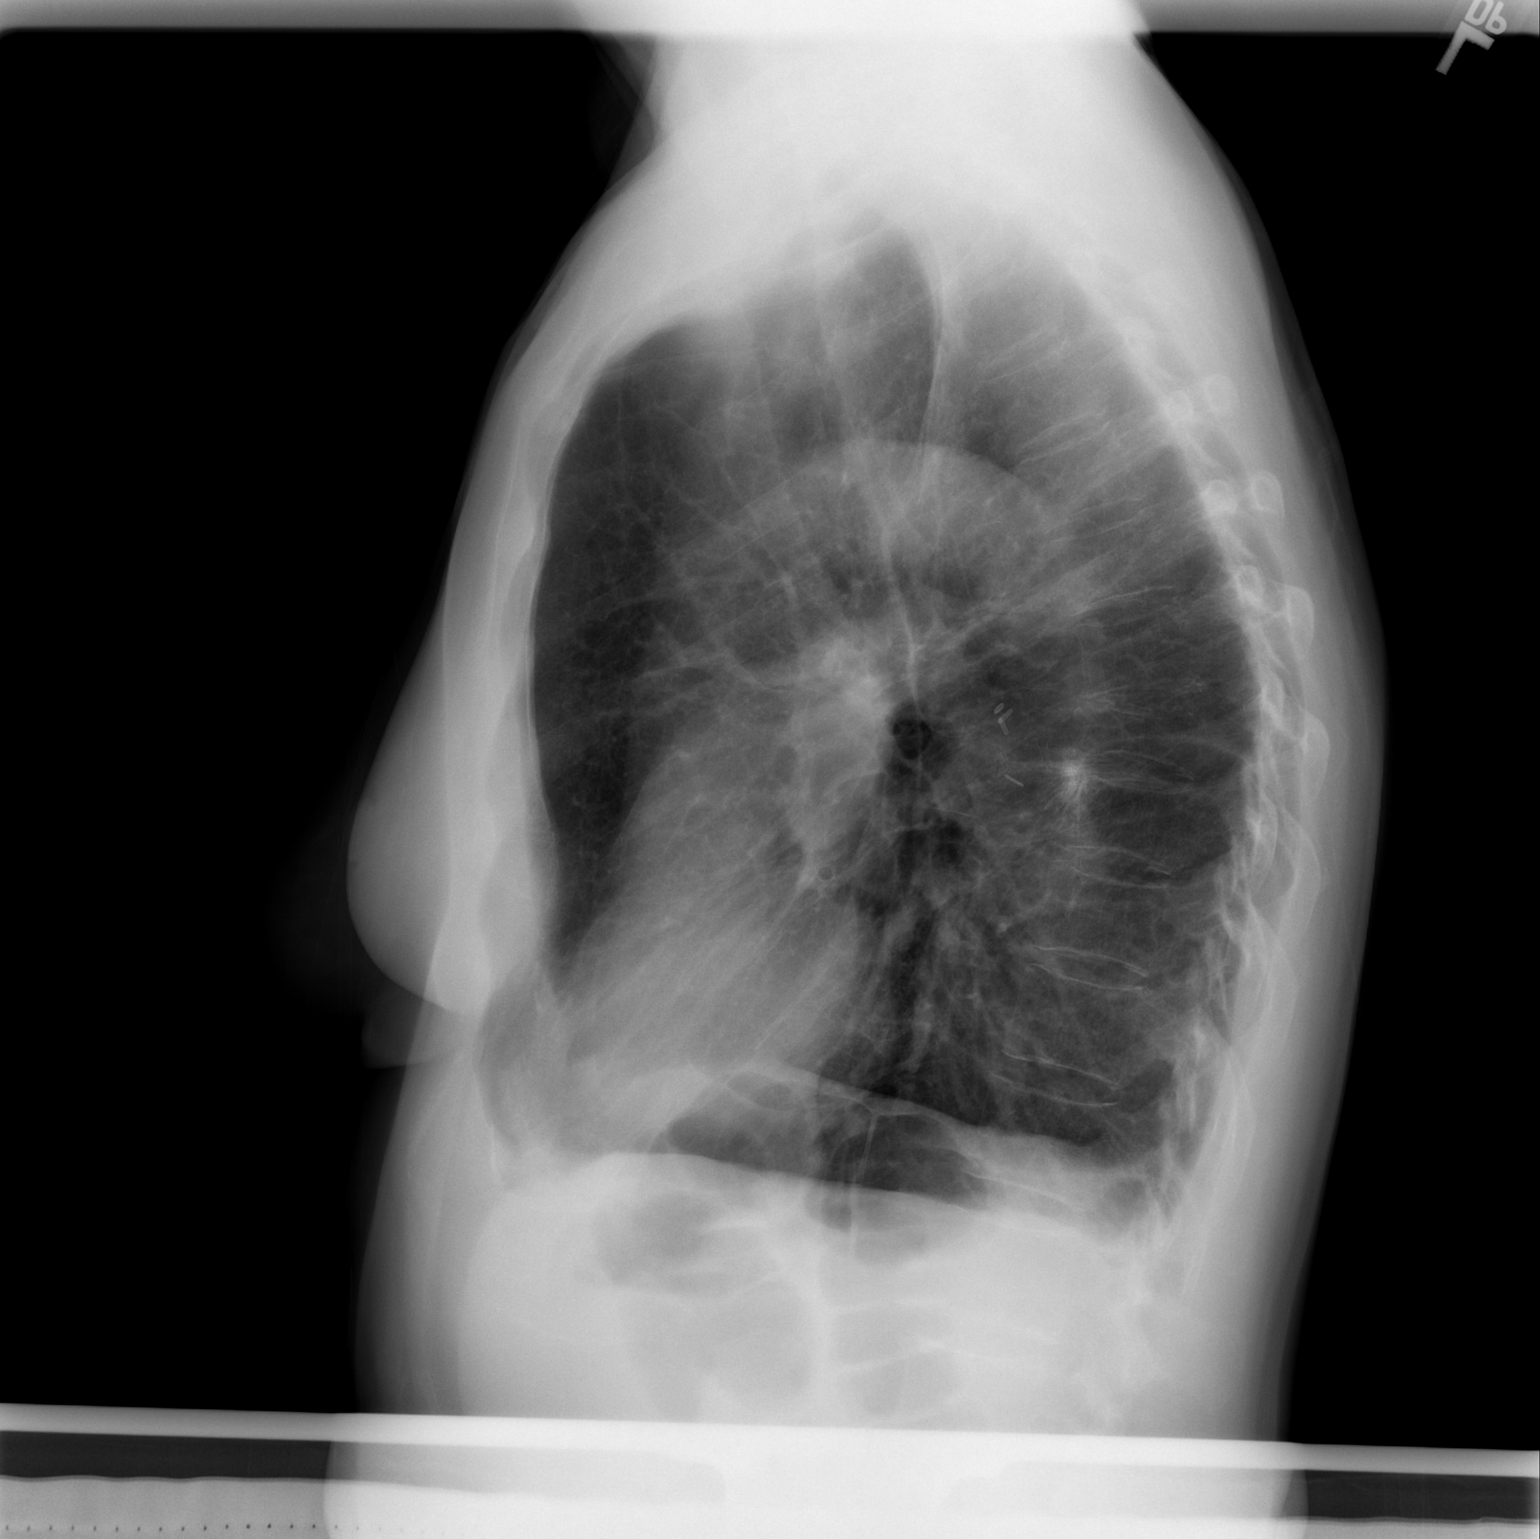

[2 of 2 positions shown; findings below may reference images not displayed]

FINDINGS: The lungs remain clear but hyperaerated.  A vague opacity
peripherally at the left lung base is stable and most consistent
with scarring.  Surgical clips overlie the left hilum.  The heart
is within upper limits of normal.  A compressed mid thoracic
vertebral body appears stable.
IMPRESSION: COPD.  Stable probable scarring at the left lung base.

## 2013-03-03 ENCOUNTER — Ambulatory Visit: Payer: Medicare Other | Admitting: Neurosurgery

## 2013-03-03 ENCOUNTER — Encounter (INDEPENDENT_AMBULATORY_CARE_PROVIDER_SITE_OTHER): Payer: Medicare Other | Admitting: *Deleted

## 2013-03-03 DIAGNOSIS — I714 Abdominal aortic aneurysm, without rupture: Secondary | ICD-10-CM

## 2013-03-04 ENCOUNTER — Other Ambulatory Visit: Payer: Self-pay | Admitting: *Deleted

## 2013-03-04 DIAGNOSIS — I714 Abdominal aortic aneurysm, without rupture: Secondary | ICD-10-CM

## 2013-03-09 ENCOUNTER — Encounter: Payer: Self-pay | Admitting: Vascular Surgery

## 2013-03-11 ENCOUNTER — Other Ambulatory Visit: Payer: Self-pay | Admitting: Family Medicine

## 2013-03-11 DIAGNOSIS — Z139 Encounter for screening, unspecified: Secondary | ICD-10-CM

## 2013-03-13 ENCOUNTER — Ambulatory Visit (HOSPITAL_COMMUNITY)
Admission: RE | Admit: 2013-03-13 | Discharge: 2013-03-13 | Disposition: A | Discharge status: Home / Self Care | Payer: Medicare Other | Source: Ambulatory Visit | Attending: Family Medicine | Admitting: Family Medicine

## 2013-03-13 DIAGNOSIS — Z139 Encounter for screening, unspecified: Secondary | ICD-10-CM

## 2013-03-13 DIAGNOSIS — Z1231 Encounter for screening mammogram for malignant neoplasm of breast: Secondary | ICD-10-CM | POA: Insufficient documentation

## 2013-03-31 ENCOUNTER — Telehealth: Payer: Self-pay | Admitting: Family Medicine

## 2013-03-31 ENCOUNTER — Telehealth: Payer: Self-pay

## 2013-03-31 MED ORDER — CITALOPRAM HYDROBROMIDE 40 MG PO TABS: 40.0000 mg | ORAL_TABLET | Freq: Every day | ORAL | Status: DC

## 2013-03-31 NOTE — Telephone Encounter (Signed)
Rx Refilled  

## 2013-03-31 NOTE — Telephone Encounter (Signed)
Pt called to say the Protonix helped a lot. She has been out of it for awhile, but she would like to have it to keep on hand. Please send script to her mail order pharmacy which is listed.

## 2013-04-01 MED ORDER — PANTOPRAZOLE SODIUM 40 MG PO TBEC: 40.0000 mg | DELAYED_RELEASE_TABLET | Freq: Every day | ORAL | Status: DC

## 2013-04-01 NOTE — Telephone Encounter (Signed)
Done

## 2013-05-05 ENCOUNTER — Telehealth: Payer: Self-pay | Admitting: Family Medicine

## 2013-05-06 MED ORDER — CITALOPRAM HYDROBROMIDE 40 MG PO TABS: 40.0000 mg | ORAL_TABLET | Freq: Every day | ORAL | Status: DC

## 2013-05-06 NOTE — Telephone Encounter (Signed)
Filled for 90 day supply.

## 2013-05-07 ENCOUNTER — Ambulatory Visit (INDEPENDENT_AMBULATORY_CARE_PROVIDER_SITE_OTHER): Payer: Medicare Other | Admitting: Family Medicine

## 2013-05-07 ENCOUNTER — Encounter: Payer: Self-pay | Admitting: Family Medicine

## 2013-05-07 VITALS — BP 130/78 | HR 80 | Temp 98.2°F | Resp 16 | Wt 129.0 lb

## 2013-05-07 DIAGNOSIS — L209 Atopic dermatitis, unspecified: Secondary | ICD-10-CM

## 2013-05-07 DIAGNOSIS — L2089 Other atopic dermatitis: Secondary | ICD-10-CM

## 2013-05-07 MED ORDER — TRIAMCINOLONE ACETONIDE 0.1 % EX CREA: TOPICAL_CREAM | Freq: Two times a day (BID) | CUTANEOUS | Status: DC

## 2013-05-07 NOTE — Progress Notes (Signed)
Subjective:    Patient ID: [name], female    DOB: [birth year], 72 y.o.   MRN: [id number]  HPI Patient reports a one-month history of a scaly erythematous rash that is disseminated all over her legs and thighs. There possibly 1 cm erythematous patches with fine white scale. It is worse behind her knees. It is itchy.  She has tried soaking in salt water and using moisturizers without benefit.  It has the appearance of DSAP. Past Medical History  Diagnosis Date  . COPD (chronic obstructive pulmonary disease)   . Anxiety disorder   . Hypothyroidism   . PUD (peptic ulcer disease)     49yrs  . Dysphagia   . IBS (irritable bowel syndrome)   . Diverticulosis   . Trigeminal neuralgia   . Diastolic dysfunction   . Depression   . Allergic rhinitis   . 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 Hiatal hernia   . Aortic aneurysm    Current Outpatient Prescriptions on File Prior to Visit  Medication Sig Dispense Refill  . ALPRAZolam (XANAX) 0.5 MG tablet Take 0.25 mg by mouth daily as needed. Anxiety      . calcium citrate-vitamin D (CITRACAL+D) 315-200 MG-UNIT per tablet Take 1 tablet by mouth daily.       . Cholecalciferol (VITAMIN D-3) 5000 UNITS TABS Take 1 tablet by mouth daily.      . citalopram (CELEXA) 40 MG tablet Take 1 tablet (40 mg total) by mouth daily.  90 tablet  3  . fish oil-omega-3 fatty acids 1000 MG capsule Take 1 g by mouth daily.       Marland Kitchen levothyroxine (SYNTHROID, LEVOTHROID) 25 MCG tablet Take 25 mcg by mouth daily.        . Multiple Vitamin (MULTIVITAMIN WITH MINERALS) TABS Take 1 tablet by mouth daily.      . pantoprazole (PROTONIX) 40 MG tablet Take 1 tablet (40 mg total) by mouth daily.  90 tablet  3   No current facility-administered medications on file prior to visit.   Allergies  Allergen Reactions  . Ciprofloxacin Hcl Hives  . Iohexol      Desc: Per [facility], pt is allergic to IV contrast, no type of reaction was available. Pt  cannot remember but first reacted around 15 yrs ago from an IVP. Notes were date from 2009 at urology center., Onset Date: [id number]   . Prozac (Fluoxetine Hcl) Rash  . Sulfonamide Derivatives Hives and Rash   History   Social History  . Marital Status: Divorced    Spouse Name: N/A    Number of Children: N/A  . Years of Education: N/A   Occupational History  . Not on file.   Social History Main Topics  . Smoking status: Current Every Day Smoker -- 0.50 packs/day for 20 years    Types: Cigarettes  . Smokeless tobacco: Never Used     Comment: smoking cessation info given and reviewed   . Alcohol Use: Yes     Comment: occasional  . Drug Use: No  . Sexually Active: Yes    Birth Control/ Protection: Surgical   Other Topics Concern  . Not on file   Social History Narrative  . No narrative on file      Review of Systems  All other systems reviewed and are negative.       Objective:   Physical Exam  Vitals reviewed. Constitutional: She appears well-developed and well-nourished.  Cardiovascular:  Normal rate, regular rhythm and normal heart sounds.   No murmur heard. Pulmonary/Chest: Effort normal and breath sounds normal. No respiratory distress. She has no wheezes. She has no rales.  Abdominal: Soft. Bowel sounds are normal. She exhibits no distension. There is no tenderness. There is no rebound.  Skin: Rash noted.   1 cm abdomen is tools with fine white scale on thighs and shins and behind both knees.        Assessment & Plan:  1. Atopic dermatitis Eczema versus DSAP.  Trial of triamcinolone 0.1% cream apply twice a day for 10 days. Recheck in 2 weeks. - triamcinolone cream (KENALOG) 0.1 %; Apply topically 2 (two) times daily.  Dispense: 45 g; Refill: 1

## 2013-05-21 IMAGING — CT CT CHEST W/O CM
2 of 3 series · 15 of 36 positions shown, 18 images · non-contrast
Comparison: Chest CT 11/01/2010 and PET CT 11/13/2010.

CLINICAL DATA: Lung cancer status post partial lung resection.

CT CHEST WITHOUT CONTRAST
TECHNIQUE: Multidetector CT imaging of the chest was performed
following the standard protocol without IV contrast.

[Series 2: chestroutine 5.0 b40f · axial · 0.62mm/px · z∈[-344,-74]mm · 12 of 64 slices shown, 15 images]
[im 5/64  mediastinal]
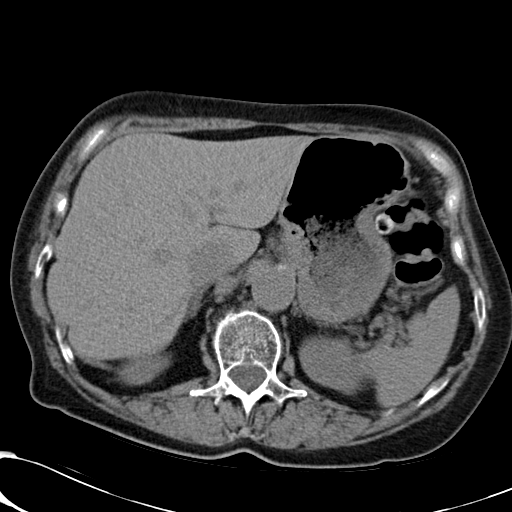
[im 5/64  lung]
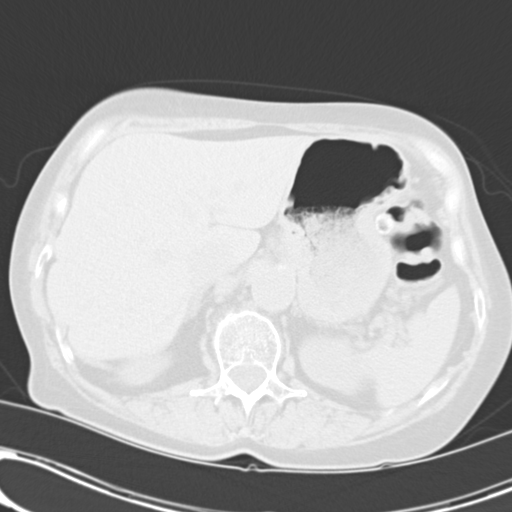
[im 10/64  lung]
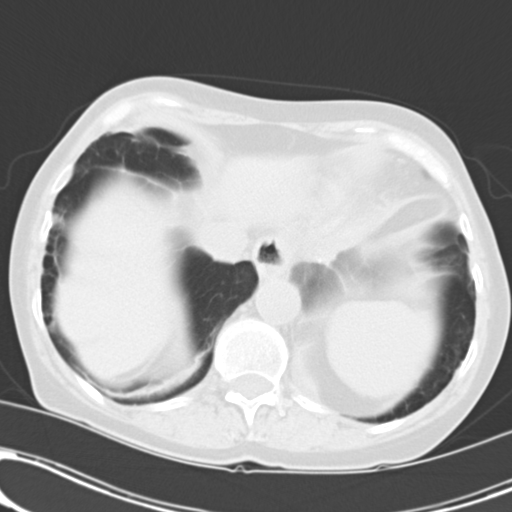
[im 15/64  lung]
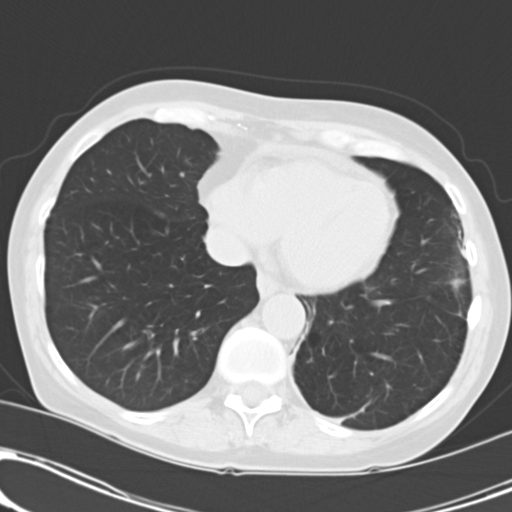
[im 19/64  lung]
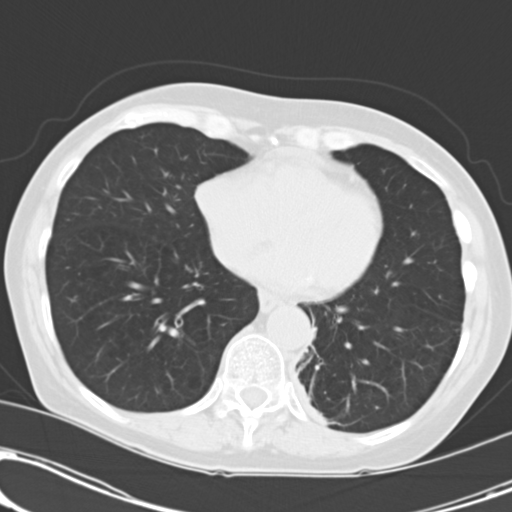
[im 24/64  mediastinal]
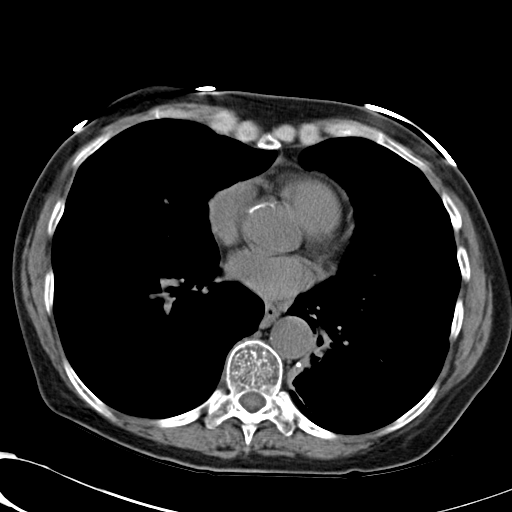
[im 24/64  lung]
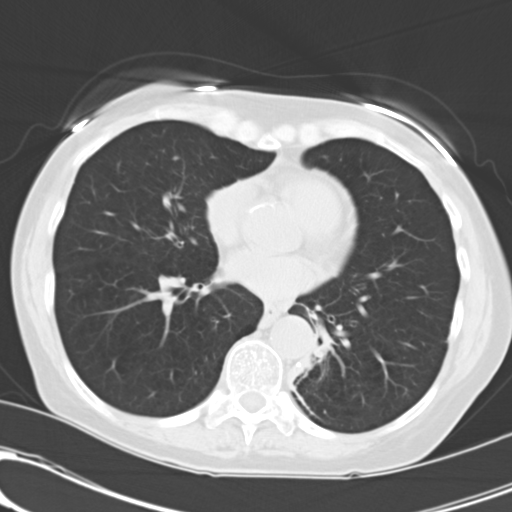
[im 29/64  lung]
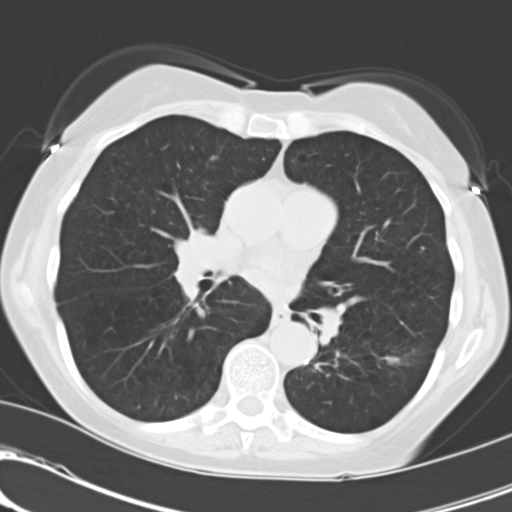
[im 36/64  lung]
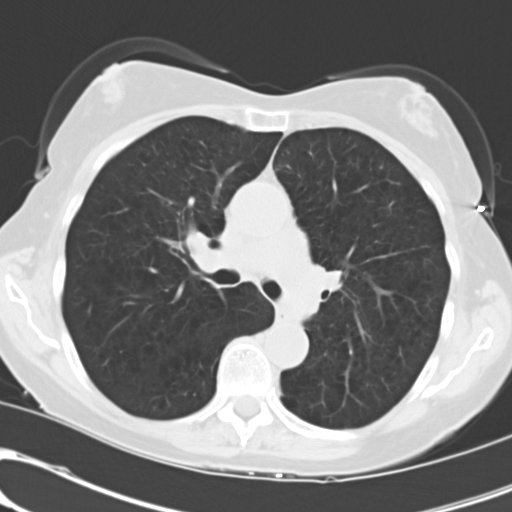
[im 40/64  lung]
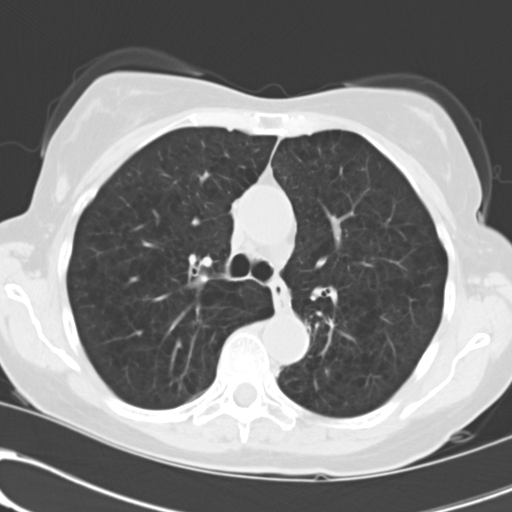
[im 45/64  mediastinal]
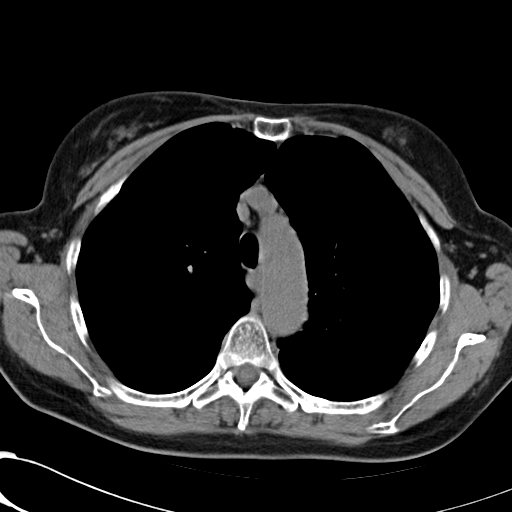
[im 45/64  lung]
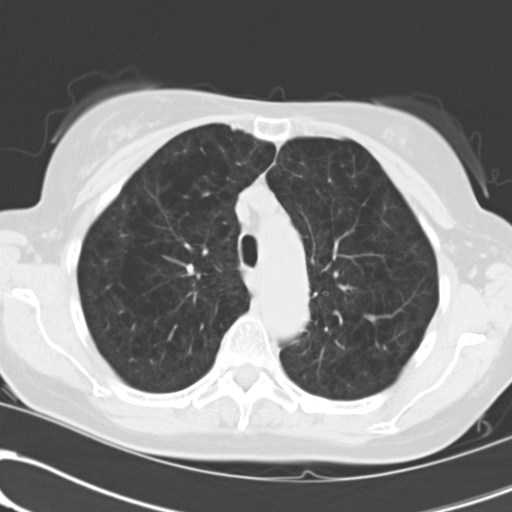
[im 50/64  lung]
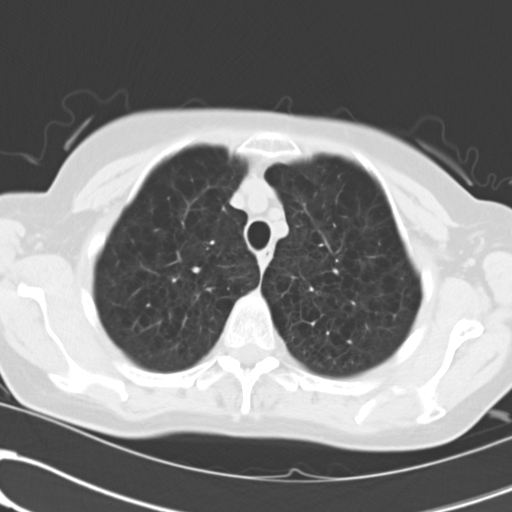
[im 54/64  lung]
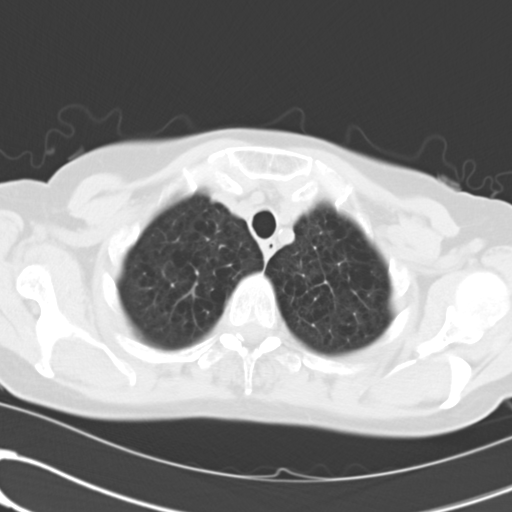
[im 59/64  lung]
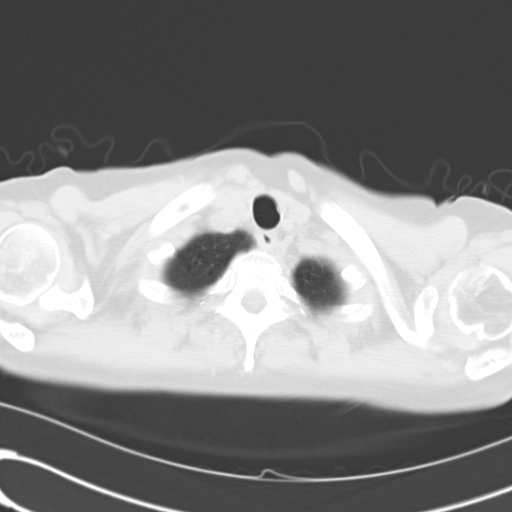

[Series 4: mpr coro 3mm · coronal · 0.63mm/px · 3 of 82 slices shown]
[im 17/82  lung]
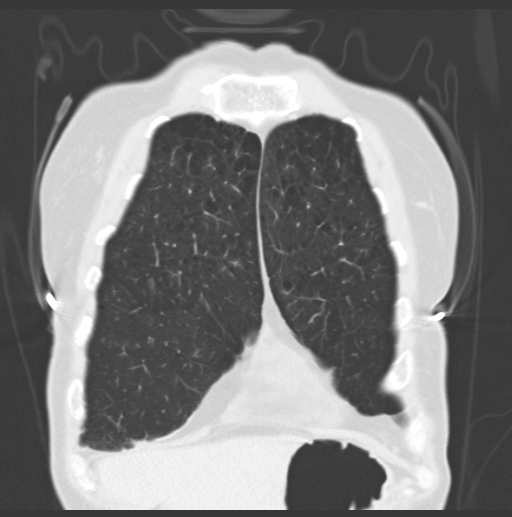
[im 33/82  lung]
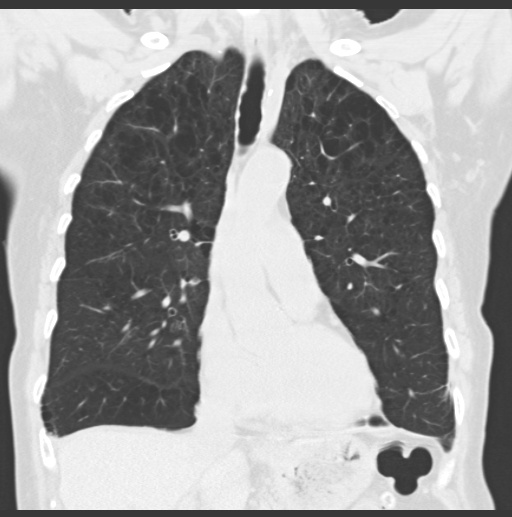
[im 49/82  lung]
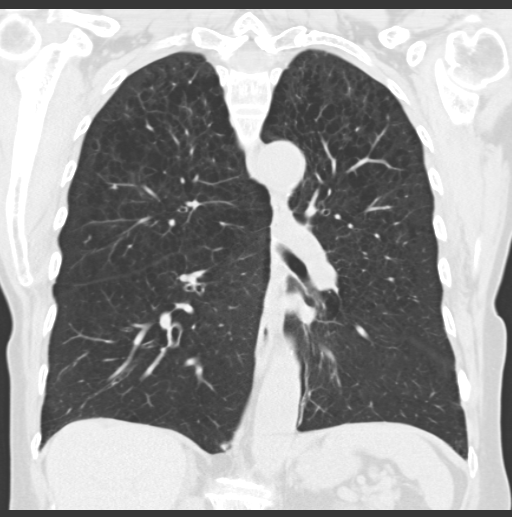

[15 of 36 positions shown; findings below may reference images not displayed]

FINDINGS: The patient has undergone apparent wedge resection of the
irregular nodule involving the superior segment left lower lobe.
There is mild linear scarring adjacent to the surgical clips.
There are no suspicious pulmonary nodules.  Diffuse emphysematous
changes are noted.

Within the limitations of noncontrast technique, the mediastinum
has a stable appearance.  Lymphoid tissue in the hilar regions
appears unchanged.  There is a small amount of pericardial fluid.
No significant pleural effusion is present.  There is mild pleural
thickening medially in the left hemithorax adjacent to the
resection site.

The visualized upper abdomen has a stable appearance.  There is a
small hiatal hernia.  No suspicious osseous lesions are identified.
T8 compression fracture appears unchanged.
IMPRESSION: 1.  Interval wedge resection of left lower lobe spiculated nodule.
2.  There is mild scarring within the operative bed.  No signs of
local recurrence or metastatic disease.
3.  Diffuse emphysema.
4.  Stable T8 compression fracture.

## 2013-05-25 ENCOUNTER — Telehealth: Payer: Self-pay | Admitting: Family Medicine

## 2013-05-26 NOTE — Telephone Encounter (Signed)
Yes she can keep using TAC 0.1% cream bid for up to 2 more weeks, but I would not use it daily for 3 months.

## 2013-05-26 NOTE — Telephone Encounter (Signed)
Pt aware.

## 2013-06-02 ENCOUNTER — Other Ambulatory Visit: Payer: Self-pay | Admitting: Family Medicine

## 2013-06-02 ENCOUNTER — Telehealth: Payer: Self-pay | Admitting: Family Medicine

## 2013-06-02 MED ORDER — CARBAMAZEPINE ER 100 MG PO TB12: 100.0000 mg | ORAL_TABLET | Freq: Two times a day (BID) | ORAL | Status: DC

## 2013-06-02 NOTE — Telephone Encounter (Signed)
Patient aware and appt made 

## 2013-06-02 NOTE — Telephone Encounter (Signed)
Ok to refill 

## 2013-06-02 NOTE — Telephone Encounter (Signed)
ok 

## 2013-06-02 NOTE — Telephone Encounter (Signed)
Try tegretol 100 mg pobid and recheck here in 3 weeks

## 2013-06-03 NOTE — Telephone Encounter (Signed)
Meds refilled.

## 2013-06-22 ENCOUNTER — Ambulatory Visit (INDEPENDENT_AMBULATORY_CARE_PROVIDER_SITE_OTHER): Payer: Medicare Other | Admitting: Family Medicine

## 2013-06-22 ENCOUNTER — Encounter: Payer: Self-pay | Admitting: Family Medicine

## 2013-06-22 VITALS — BP 110/76 | HR 68 | Temp 97.9°F | Resp 16 | Wt 133.0 lb

## 2013-06-22 DIAGNOSIS — G500 Trigeminal neuralgia: Secondary | ICD-10-CM

## 2013-06-22 DIAGNOSIS — G4700 Insomnia, unspecified: Secondary | ICD-10-CM

## 2013-06-22 NOTE — Progress Notes (Signed)
Subjective:    Patient ID: [name], female    DOB: Nov 30, 1940, 72 y.o.   MRN: [id number]  HPI  Patient called last week complaining of pain in the left side of her face along the distribution of the trigeminal nerve. She has a history of trigeminal neuralgia. I gave her a prescription for Tegretol extended release 100 mg by mouth twice a day. She states that helped numb the pain but it also made her very disoriented, somnolent, and confused. Therefore, she stopped the medication. She states that she has not slept for over a year. She can only sleep one to 2 hours at the time. She never gets a full night sleep. She wonders if this could be causing some mild confusion and lethargy.  Unfortunately the pain associate with her trigeminal neuralgia seems to be worsening. She is hesitant to take Tegretol because of confusion and disorientation the cause. However at that time she also was not sleeping. Past Medical History  Diagnosis Date  . COPD (chronic obstructive pulmonary disease)   . Anxiety disorder   . Hypothyroidism   . PUD (peptic ulcer disease)     46yrs  . Dysphagia   . IBS (irritable bowel syndrome)   . Diverticulosis   . Trigeminal neuralgia   . Diastolic dysfunction   . Depression   . Allergic rhinitis   . 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 Hiatal hernia   . Aortic aneurysm    Current Outpatient Prescriptions on File Prior to Visit  Medication Sig Dispense Refill  . ALPRAZolam (XANAX) 0.5 MG tablet TAKE 1 TABLET EVERY 8 HOURS AS NEEDED  30 tablet  2  . calcium citrate-vitamin D (CITRACAL+D) 315-200 MG-UNIT per tablet Take 1 tablet by mouth daily.       . Cholecalciferol (VITAMIN D-3) 5000 UNITS TABS Take 1 tablet by mouth daily.      . citalopram (CELEXA) 40 MG tablet Take 1 tablet (40 mg total) by mouth daily.  90 tablet  3  . fish oil-omega-3 fatty acids 1000 MG capsule Take 1 g by mouth daily.       Marland Kitchen levothyroxine (SYNTHROID, LEVOTHROID) 25 MCG  tablet Take 25 mcg by mouth daily.        . Multiple Vitamin (MULTIVITAMIN WITH MINERALS) TABS Take 1 tablet by mouth daily.      . pantoprazole (PROTONIX) 40 MG tablet Take 1 tablet (40 mg total) by mouth daily.  90 tablet  3  . triamcinolone cream (KENALOG) 0.1 % Apply topically 2 (two) times daily.  45 g  1  . carbamazepine (TEGRETOL-XR) 100 MG 12 hr tablet Take 1 tablet (100 mg total) by mouth 2 (two) times daily.  60 tablet  0   No current facility-administered medications on file prior to visit.   Past Surgical History  Procedure Laterality Date  . 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 Esophagogastroduodenoscopy  11/13/2009    w/dilation to 14mm, gastric ulceration (H.Pylori) s/p treatment  . Esophagogastroduodenoscopy  11/21/2009    distal esophageal web, gastritis  . Lung cancer surgery  12/2010    Left VATS, minithoracotomy, LLL superior segmentectomy  . Vocal cord surgery  02/06/2011    laryngoscopy with bilateral vocal cord Radiesse injection for vocal cord paralysis  . Abdominal hysterectomy    . Cholecystectomy    . Tubal ligation    . Cataract extraction    .  Fatty tumor removal from lt groin    . Cataract extraction w/phaco  09/02/2012    Procedure: CATARACT EXTRACTION PHACO AND INTRAOCULAR LENS PLACEMENT (IOC);  Surgeon: Loraine Leriche T. Nile Riggs, MD;  Location: [facility];  Service: Ophthalmology;  Laterality: Left;  CDE:10.35   Allergies  Allergen Reactions  . Ciprofloxacin Hcl Hives  . Iohexol      Desc: Per [facility], pt is allergic to IV contrast, no type of reaction was available. Pt cannot remember but first reacted around 15 yrs ago from an IVP. Notes were date from 2009 at urology center., Onset Date: [id number]   . Prozac [Fluoxetine Hcl] Rash  . Sulfonamide Derivatives Hives and Rash   History   Social History  . Marital Status: Divorced    Spouse Name: N/A    Number of Children: N/A  . Years of Education: N/A    Occupational History  . Not on file.   Social History Main Topics  . Smoking status: Current Every Day Smoker -- 0.50 packs/day for 20 years    Types: Cigarettes  . Smokeless tobacco: Never Used     Comment: smoking cessation info given and reviewed   . Alcohol Use: Yes     Comment: occasional  . Drug Use: No  . Sexual Activity: Yes    Birth Control/ Protection: Surgical   Other Topics Concern  . Not on file   Social History Narrative  . No narrative on file     Review of Systems  All other systems reviewed and are negative.       Objective:   Physical Exam  Vitals reviewed. Constitutional: She is oriented to person, place, and time.  Cardiovascular: Normal rate, regular rhythm and normal heart sounds.  Exam reveals no gallop and no friction rub.   No murmur heard. Pulmonary/Chest: Effort normal and breath sounds normal. No respiratory distress. She has no wheezes. She has no rales.  Abdominal: Soft. Bowel sounds are normal. She exhibits no distension. There is no tenderness. There is no rebound and no guarding.  Neurological: She is alert and oriented to person, place, and time. She has normal reflexes. She displays normal reflexes. No cranial nerve deficit. She exhibits normal muscle tone. Coordination normal.          Assessment & Plan:  1. Trigeminal neuralgia I will obtain an MRI of the brain to rule out a lesion on the left trigeminal nerve.  I will also try to treat the patient's insomnia and see that, if after she receives a good week of sleep, can we reintroduce the Tegretol to treat the pain - MR Brain W Wo Contrast; Future  2. Insomnia Xanax 0.5 mg by mouth each bedtime as needed for insomnia. Recheck in one week.

## 2013-06-25 ENCOUNTER — Other Ambulatory Visit: Payer: Self-pay | Admitting: Family Medicine

## 2013-06-25 ENCOUNTER — Ambulatory Visit (HOSPITAL_COMMUNITY)
Admission: RE | Admit: 2013-06-25 | Discharge: 2013-06-25 | Disposition: A | Discharge status: Home / Self Care | Payer: Medicare Other | Source: Ambulatory Visit | Attending: Family Medicine | Admitting: Family Medicine

## 2013-06-25 DIAGNOSIS — Z85118 Personal history of other malignant neoplasm of bronchus and lung: Secondary | ICD-10-CM | POA: Insufficient documentation

## 2013-06-25 DIAGNOSIS — R220 Localized swelling, mass and lump, head: Secondary | ICD-10-CM | POA: Insufficient documentation

## 2013-06-25 DIAGNOSIS — G500 Trigeminal neuralgia: Secondary | ICD-10-CM

## 2013-06-25 DIAGNOSIS — I720 Aneurysm of carotid artery: Secondary | ICD-10-CM

## 2013-06-25 DIAGNOSIS — H9209 Otalgia, unspecified ear: Secondary | ICD-10-CM | POA: Insufficient documentation

## 2013-06-25 LAB — POCT I-STAT CREATININE: Creatinine, Ser: 0.9 mg/dL (ref 0.50–1.10)

## 2013-06-25 MED ORDER — GADOBENATE DIMEGLUMINE 529 MG/ML IV SOLN
12.0000 mL | Freq: Once | INTRAVENOUS | Status: AC | PRN
  Administered 2013-06-25: 12 mL via INTRAVENOUS

## 2013-07-02 ENCOUNTER — Ambulatory Visit (HOSPITAL_COMMUNITY): Admission: RE | Admit: 2013-07-02 | Payer: Medicare Other | Source: Ambulatory Visit

## 2013-07-03 ENCOUNTER — Telehealth: Payer: Self-pay | Admitting: Family Medicine

## 2013-07-03 NOTE — Telephone Encounter (Signed)
Pt said that her left ear lobe is swollen. She said that a CMA noticed it when she was helping her with her neighbor. She wants to know if this has something to do with her trigeminal neuralgia. She also wants to know if there is anything to worry about with it.

## 2013-07-05 ENCOUNTER — Telehealth: Payer: Self-pay | Admitting: Family Medicine

## 2013-07-05 MED ORDER — ACETYLCYSTEINE 20 % IN SOLN: RESPIRATORY_TRACT | Status: DC

## 2013-07-05 NOTE — Telephone Encounter (Signed)
Pt called, has CT scheduled for Monday , pharmacy had not received script for mucomyst Called multiple pharmacies only available at [facility] Discussed with pt, will start Sunday 9/28

## 2013-07-05 NOTE — Telephone Encounter (Signed)
Not due to trigeminal neuralgia.  NTBS if worse.

## 2013-07-06 ENCOUNTER — Other Ambulatory Visit: Payer: Self-pay | Admitting: Family Medicine

## 2013-07-06 ENCOUNTER — Ambulatory Visit (HOSPITAL_COMMUNITY)
Admission: RE | Admit: 2013-07-06 | Discharge: 2013-07-06 | Disposition: A | Discharge status: Home / Self Care | Payer: Medicare Other | Source: Ambulatory Visit | Attending: Family Medicine | Admitting: Family Medicine

## 2013-07-06 DIAGNOSIS — I720 Aneurysm of carotid artery: Secondary | ICD-10-CM

## 2013-07-06 MED ORDER — DIPHENHYDRAMINE HCL 50 MG PO CAPS: 50.0000 mg | ORAL_CAPSULE | Freq: Four times a day (QID) | ORAL | Status: DC | PRN

## 2013-07-06 MED ORDER — PREDNISONE 50 MG PO TABS: ORAL_TABLET | ORAL | Status: DC

## 2013-07-06 NOTE — Telephone Encounter (Signed)
Pt aware and will call back if she wants an appt

## 2013-07-06 NOTE — Telephone Encounter (Signed)
Meds sent to pharm for prior use to CT

## 2013-07-08 ENCOUNTER — Ambulatory Visit (HOSPITAL_COMMUNITY)
Admission: RE | Admit: 2013-07-08 | Discharge: 2013-07-08 | Disposition: A | Discharge status: Home / Self Care | Payer: Medicare Other | Source: Ambulatory Visit | Attending: Family Medicine | Admitting: Family Medicine

## 2013-07-08 DIAGNOSIS — I720 Aneurysm of carotid artery: Secondary | ICD-10-CM | POA: Insufficient documentation

## 2013-07-08 DIAGNOSIS — R220 Localized swelling, mass and lump, head: Secondary | ICD-10-CM | POA: Insufficient documentation

## 2013-07-08 MED ORDER — IOHEXOL 350 MG/ML SOLN
100.0000 mL | Freq: Once | INTRAVENOUS | Status: AC | PRN
  Administered 2013-07-08: 80 mL via INTRAVENOUS

## 2013-07-08 NOTE — Progress Notes (Signed)
PT IS ALLERGIC TO IV CONTRAST. PATIENT WAS GIVEN 13 HOUR PRE MED REGIMEN FOR CONTRAST ALLERGY.

## 2013-07-09 ENCOUNTER — Other Ambulatory Visit: Payer: Self-pay | Admitting: Family Medicine

## 2013-07-09 DIAGNOSIS — I720 Aneurysm of carotid artery: Secondary | ICD-10-CM

## 2013-07-10 ENCOUNTER — Telehealth: Payer: Self-pay | Admitting: Family Medicine

## 2013-07-10 NOTE — Telephone Encounter (Signed)
Spoke to pt and answered questions

## 2013-07-14 ENCOUNTER — Telehealth: Payer: Self-pay | Admitting: Family Medicine

## 2013-07-14 NOTE — Telephone Encounter (Signed)
She has questions about her MRI results.Please call

## 2013-07-15 NOTE — Telephone Encounter (Signed)
Questions answered.

## 2013-08-04 ENCOUNTER — Other Ambulatory Visit: Payer: Self-pay | Admitting: Family Medicine

## 2013-08-04 MED ORDER — ALPRAZOLAM 0.5 MG PO TABS: 0.5000 mg | ORAL_TABLET | Freq: Three times a day (TID) | ORAL | Status: DC | PRN

## 2013-08-04 NOTE — Addendum Note (Signed)
Addended by: Donne Anon on: 08/04/2013 01:08 PM   Modules accepted: Orders

## 2013-08-04 NOTE — Telephone Encounter (Signed)
Xanax refill printed for faxing to mail order

## 2013-08-04 NOTE — Telephone Encounter (Signed)
Wants all through mail order.  ?? Alprazolam??  Do you want to order 90 day supply of that??  Over due for routine office visit and lab work.  Does have appt 11/4. OK 90 day mail order refills??

## 2013-08-06 ENCOUNTER — Encounter: Payer: Medicare Other | Admitting: Vascular Surgery

## 2013-08-10 ENCOUNTER — Encounter: Payer: Self-pay | Admitting: Vascular Surgery

## 2013-08-11 ENCOUNTER — Encounter (INDEPENDENT_AMBULATORY_CARE_PROVIDER_SITE_OTHER): Payer: Self-pay

## 2013-08-11 ENCOUNTER — Other Ambulatory Visit: Payer: Self-pay | Admitting: *Deleted

## 2013-08-11 ENCOUNTER — Ambulatory Visit: Payer: Medicare Other | Admitting: Family Medicine

## 2013-08-11 ENCOUNTER — Ambulatory Visit (INDEPENDENT_AMBULATORY_CARE_PROVIDER_SITE_OTHER): Payer: Medicare Other | Admitting: Vascular Surgery

## 2013-08-11 ENCOUNTER — Encounter: Payer: Self-pay | Admitting: Vascular Surgery

## 2013-08-11 VITALS — BP 104/77 | HR 69 | Ht 65.5 in | Wt 134.0 lb

## 2013-08-11 DIAGNOSIS — I720 Aneurysm of carotid artery: Secondary | ICD-10-CM | POA: Insufficient documentation

## 2013-08-11 NOTE — Progress Notes (Signed)
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 I seen her in the past for evaluation of his small abdominal aortic aneurysm and this is been followed with serial ultrasounds. Size of this is just over 3 cm. She recently was undergoing workup for trigeminal neuralgia with an MR I have her brain. 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 The trigeminal neuralgia is on the left side. She had no known history of carotid aneurysm and has had no symptoms related to this. Specifically no amaurosis fugax transient ischemic attack or stroke. She has no neck pain on the right side. She is right-handed. She does have a history of prior left lung surgery for cancer and has had no evidence of recurrent disease. 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  Past Medical History  Diagnosis Date  . COPD (chronic obstructive pulmonary disease)   . Anxiety disorder   . Hypothyroidism   . PUD (peptic ulcer disease)     43yrs  . Dysphagia   . IBS (irritable bowel syndrome)   . Diverticulosis   . Trigeminal neuralgia   . Diastolic dysfunction   . Depression   . Allergic rhinitis   . 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 Hiatal hernia   . Aortic aneurysm     History  Substance Use Topics  . Smoking status: Former Smoker -- 0.50 packs/day for 20 years    Types: Cigarettes    Quit date: 12/21/2010  . Smokeless tobacco: Never Used     Comment: smoking cessation info given and reviewed   . Alcohol Use: No     Comment: occasional    Family History  Problem Relation Age of Onset  . Liver cancer Sister   . Cancer Sister   . Pulmonary embolism Mother   . Heart attack Father   . Hypertension Father   . Cancer Brother   . Cancer Daughter     Allergies  Allergen Reactions  . Ciprofloxacin Hcl Hives  . Iohexol      Desc: [facility], pt is allergic to IV contrast, no type of reaction was available. Pt cannot remember but first reacted around 15 yrs ago from an IVP. Notes were date from 2009 at urology center., Onset Date: 16109604   . Prozac [Fluoxetine Hcl] Rash  . Sulfonamide Derivatives Hives and Rash    Current outpatient prescriptions:acetylcysteine (MUCOMYST) 20 % nebulizer solution, 600mg  BID 1 day prior to CT and day of, Disp: 9 mL, Rfl: 0;  alendronate (FOSAMAX) 70 MG tablet, Take 1 tablet by mouth  weekly, Disp: 12 tablet, Rfl: 3;  ALPRAZolam (XANAX) 0.5 MG tablet, Take 1 tablet (0.5 mg total) by mouth 3 (three) times daily as needed for anxiety., Disp: 30 tablet, Rfl: 2 calcium citrate-vitamin D (CITRACAL+D) 315-200 MG-UNIT per tablet, Take 1 tablet by mouth daily. , Disp: , Rfl: ;  carbamazepine (TEGRETOL-XR) 100 MG 12 hr tablet, Take 1 tablet (100 mg total) by mouth 2 (two) times daily., Disp: 60 tablet, Rfl: 0;  Cholecalciferol (VITAMIN D-3) 5000 UNITS TABS, Take 1 tablet by mouth daily., Disp: , Rfl: ;  citalopram (CELEXA) 40 MG tablet, Take 1 tablet (40 mg total) by mouth daily., Disp: 90 tablet, Rfl: 3 diphenhydrAMINE (BENADRYL) 50 MG capsule, Take 1 capsule (50 mg total) by mouth every  6 (six) hours as needed for itching., Disp: 30 capsule, Rfl: 0;  fish oil-omega-3 fatty acids 1000 MG capsule, Take 1 g by mouth daily. , Disp: , Rfl: ;  levothyroxine (SYNTHROID, LEVOTHROID) 25 MCG tablet, Take 1 tablet by mouth  every day. Equivalent to Levoxyl, Disp: 90 tablet, Rfl: 3 Multiple Vitamin (MULTIVITAMIN WITH MINERALS) TABS, Take 1 tablet by mouth daily., Disp: , Rfl: ;  pantoprazole (PROTONIX) 40 MG tablet, Take 1 tablet (40 mg total) by mouth daily., Disp: 90 tablet, Rfl: 3;  predniSONE (DELTASONE) 50 MG tablet, 1 tab po 13 hours, 7 hours and 1 hour prior to CT scan., Disp: 3 tablet, Rfl: 0;  triamcinolone cream (KENALOG) 0.1 %, Apply topically 2 (two) times daily., Disp: 45 g, Rfl: 1  BP 104/77  Pulse 69  Ht  5' 5.5" (1.664 m)  Wt 134 lb (60.782 kg)  BMI 21.95 kg/m2  SpO2 94%  Body mass index is 21.95 kg/(m^2).       Physical exam: Well-developed well-nourished thin white female no acute distress Her aneurysm is palpable at the angle of her jaw on the right and is nontender. She has no bruits bilaterally. Neurologically she is grossly intact Respirations equal and nonlabored with no wheezing Heart regular rate and rhythm Abdomen soft nontender no aneurysm palpable Pulse status 2+ radial and 2+ dorsalis pedis pulses bilaterally Skin without ulcers or rashes  Angiogram of her neck was reviewed which was ordered following the MRI. This does show a 3 cm saccular aneurysm. She is extremely tortuous internal carotid artery after the bifurcation and this aneurysm actually arises off a lower loop of the tortuous carotid artery. 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 I do not feel comfortable with observation of this to explain the only option for treatment would be surgery. I explained that with her extreme redundancy that I feel very likely that we could exclude the aneurysm with end-to-end anastomosis of the normal internal carotid artery. I explained a slight risk of stroke in 1-2% a slight risk of cranial nerve injury as well. She understands and wished to proceed as soon as possible. We have scheduled her surgery for November 14th

## 2013-08-13 ENCOUNTER — Encounter (HOSPITAL_COMMUNITY): Payer: Self-pay | Admitting: Pharmacist

## 2013-08-13 ENCOUNTER — Other Ambulatory Visit: Payer: Self-pay

## 2013-08-17 ENCOUNTER — Ambulatory Visit (INDEPENDENT_AMBULATORY_CARE_PROVIDER_SITE_OTHER): Payer: Medicare Other | Admitting: Family Medicine

## 2013-08-17 ENCOUNTER — Encounter: Payer: Self-pay | Admitting: Family Medicine

## 2013-08-17 VITALS — BP 110/72 | HR 78 | Temp 98.0°F | Resp 18 | Wt 134.0 lb

## 2013-08-17 DIAGNOSIS — J209 Acute bronchitis, unspecified: Secondary | ICD-10-CM

## 2013-08-17 MED ORDER — AZITHROMYCIN 250 MG PO TABS: ORAL_TABLET | ORAL | Status: DC

## 2013-08-17 NOTE — Progress Notes (Signed)
Subjective:    Patient ID: [name], female    DOB: Jun 22, 1941, 72 y.o.   MRN: [id number]  HPI Patient two-day history of rhinorrhea cough productive of yellow sputum and mild shortness of breath. She denies any fever or pleurisy. She denies any sore throat or otalgia or sinus pain. 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 Past Medical History  Diagnosis Date  . COPD (chronic obstructive pulmonary disease)   . Anxiety disorder   . Hypothyroidism   . PUD (peptic ulcer disease)     82yrs  . Dysphagia   . IBS (irritable bowel syndrome)   . Diverticulosis   . Trigeminal neuralgia   . Diastolic dysfunction   . Depression   . Allergic rhinitis   . 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 Hiatal hernia   . Aortic aneurysm    Past Surgical History  Procedure Laterality Date  . 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 Esophagogastroduodenoscopy  11/13/2009    w/dilation to 14mm, gastric ulceration (H.Pylori) s/p treatment  . Esophagogastroduodenoscopy  11/21/2009    distal esophageal web, gastritis  . Lung cancer surgery  12/2010    Left VATS, minithoracotomy, LLL superior segmentectomy  . Vocal cord surgery  02/06/2011    laryngoscopy with bilateral vocal cord Radiesse injection for vocal cord paralysis  . Abdominal hysterectomy    . Cholecystectomy    . Tubal ligation    . Cataract extraction    . Fatty tumor removal from lt groin    . Cataract extraction w/phaco  09/02/2012    Procedure: CATARACT EXTRACTION PHACO AND INTRAOCULAR LENS PLACEMENT (IOC);  Surgeon: Loraine Leriche T. Nile Riggs, MD;  Location: [facility];  Service: Ophthalmology;  Laterality: Left;  CDE:10.35   Current Outpatient Prescriptions on File Prior to Visit  Medication Sig Dispense Refill  . alendronate (FOSAMAX) 70 MG tablet Take 70 mg by mouth once a week. Take with a full glass of water on an empty stomach. Take  on sundays      . ALPRAZolam (XANAX) 0.5 MG tablet Take 0.5 mg by mouth at bedtime as needed for sleep.      . calcium citrate-vitamin D (CITRACAL+D) 315-200 MG-UNIT per tablet Take 1 tablet by mouth daily.       . Cholecalciferol (VITAMIN D-3) 5000 UNITS TABS Take 5,000 Units by mouth daily.       . citalopram (CELEXA) 40 MG tablet Take 40 mg by mouth at bedtime.      . diphenhydrAMINE (BENADRYL) 25 mg capsule Take 25 mg by mouth at bedtime as needed for sleep.      . fish oil-omega-3 fatty acids 1000 MG capsule Take 1 g by mouth daily.       Marland Kitchen ibuprofen (ADVIL,MOTRIN) 200 MG tablet Take 400 mg by mouth every 6 (six) hours as needed (for pain).      Marland Kitchen levothyroxine (SYNTHROID, LEVOTHROID) 25 MCG tablet Take 25 mcg by mouth daily before breakfast.      . Multiple Vitamin (MULTIVITAMIN WITH MINERALS) TABS Take 1 tablet by mouth daily.       No current facility-administered medications on file prior to visit.   Allergies  Allergen Reactions  . Ciprofloxacin Hcl Hives  . Iohexol      Desc: Per [facility], pt is allergic to IV contrast, no type of reaction was available. Pt  cannot remember but first reacted around 15 yrs ago from an IVP. Notes were date from 2009 at urology center., Onset Date: [id number]   . Prozac [Fluoxetine Hcl] Rash  . Sulfonamide Derivatives Hives and Rash   History   Social History  . Marital Status: Divorced    Spouse Name: N/A    Number of Children: N/A  . Years of Education: N/A   Occupational History  . Not on file.   Social History Main Topics  . Smoking status: Former Smoker -- 0.50 packs/day for 20 years    Types: Cigarettes    Quit date: 12/21/2010  . Smokeless tobacco: Never Used     Comment: smoking cessation info given and reviewed   . Alcohol Use: No     Comment: occasional  . Drug Use: No  . Sexual Activity: Yes    Birth Control/ Protection: Surgical   Other Topics Concern  . Not on file   Social History Narrative  . No narrative  on file      Review of Systems  All other systems reviewed and are negative.       Objective:   Physical Exam  Vitals reviewed. HENT:  Right Ear: External ear normal.  Left Ear: External ear normal.  Nose: Nose normal.  Mouth/Throat: Oropharynx is clear and moist. No oropharyngeal exudate.  Eyes: Conjunctivae are normal.  Neck: Neck supple.  Cardiovascular: Normal rate, regular rhythm and normal heart sounds.   Pulmonary/Chest: Effort normal and breath sounds normal. No respiratory distress. She has no wheezes. She has no rales.  Abdominal: Soft. Bowel sounds are normal.  Lymphadenopathy:    She has no cervical adenopathy.          Assessment & Plan:  1. Acute bronchitis I believe this is most likely viral. Therefore I recommended tincture of time. I recommended the patient get Mucinex DM and take 1 tablet every 12 hours as needed for cough. I anticipate self limited resolution within one week. If the patient gets worse or develops fever, I did give her a prescription for Z-Pak due to the upcoming surgery with instructions on when to fil l. If she develops a fever she should call the surgeon can postpone the procedure. - azithromycin (ZITHROMAX) 250 MG tablet; 2 tabs poqday 1, 1 tab poqday 2-5  Dispense: 6 tablet; Refill: 0

## 2013-08-19 ENCOUNTER — Telehealth: Payer: Self-pay

## 2013-08-19 ENCOUNTER — Encounter (HOSPITAL_COMMUNITY)
Admission: RE | Admit: 2013-08-19 | Discharge: 2013-08-19 | Disposition: A | Discharge status: Home / Self Care | Payer: Medicare Other | Source: Ambulatory Visit | Attending: Vascular Surgery | Admitting: Vascular Surgery

## 2013-08-19 ENCOUNTER — Encounter (HOSPITAL_COMMUNITY): Payer: Self-pay | Admitting: Pharmacy Technician

## 2013-08-19 ENCOUNTER — Encounter (HOSPITAL_COMMUNITY): Payer: Self-pay

## 2013-08-19 ENCOUNTER — Encounter (HOSPITAL_COMMUNITY)
Admission: RE | Admit: 2013-08-19 | Discharge: 2013-08-19 | Disposition: A | Discharge status: Home / Self Care | Payer: Medicare Other | Source: Ambulatory Visit | Attending: Anesthesiology | Admitting: Anesthesiology

## 2013-08-19 DIAGNOSIS — Z01812 Encounter for preprocedural laboratory examination: Secondary | ICD-10-CM | POA: Insufficient documentation

## 2013-08-19 DIAGNOSIS — Z01818 Encounter for other preprocedural examination: Secondary | ICD-10-CM | POA: Insufficient documentation

## 2013-08-19 HISTORY — DX: Adverse effect of unspecified anesthetic, initial encounter: T41.45XA

## 2013-08-19 HISTORY — DX: Shortness of breath: R06.02

## 2013-08-19 LAB — CBC
HCT: 39.8 % (ref 36.0–46.0)
Hemoglobin: 13.8 g/dL (ref 12.0–15.0)
MCH: 30.9 pg (ref 26.0–34.0)
MCHC: 34.7 g/dL (ref 30.0–36.0)
MCV: 89.2 fL (ref 78.0–100.0)
Platelets: 286 10*3/uL (ref 150–400)
RBC: 4.46 MIL/uL (ref 3.87–5.11)
RDW: 13 % (ref 11.5–15.5)
WBC: 7.7 10*3/uL (ref 4.0–10.5)

## 2013-08-19 LAB — URINALYSIS, ROUTINE W REFLEX MICROSCOPIC
Glucose, UA: NEGATIVE mg/dL
Hgb urine dipstick: NEGATIVE
Ketones, ur: 15 mg/dL — AB
Leukocytes, UA: NEGATIVE
Nitrite: NEGATIVE
Protein, ur: NEGATIVE mg/dL
Specific Gravity, Urine: 1.025 (ref 1.005–1.030)
Urobilinogen, UA: 1 mg/dL (ref 0.0–1.0)
pH: 7 (ref 5.0–8.0)

## 2013-08-19 LAB — COMPREHENSIVE METABOLIC PANEL
ALT: 16 U/L (ref 0–35)
AST: 18 U/L (ref 0–37)
Albumin: 3.8 g/dL (ref 3.5–5.2)
Alkaline Phosphatase: 61 U/L (ref 39–117)
BUN: 13 mg/dL (ref 6–23)
CO2: 29 mEq/L (ref 19–32)
Calcium: 9.5 mg/dL (ref 8.4–10.5)
Chloride: 95 mEq/L — ABNORMAL LOW (ref 96–112)
Creatinine, Ser: 0.62 mg/dL (ref 0.50–1.10)
GFR calc Af Amer: >90 mL/min (ref >90)
GFR calc non Af Amer: 88 mL/min — ABNORMAL LOW (ref >90)
Glucose, Bld: 103 mg/dL — ABNORMAL HIGH (ref 70–99)
Potassium: 4 mEq/L (ref 3.5–5.1)
Sodium: 134 mEq/L — ABNORMAL LOW (ref 135–145)
Total Bilirubin: 0.2 mg/dL — ABNORMAL LOW (ref 0.3–1.2)
Total Protein: 7.2 g/dL (ref 6.0–8.3)

## 2013-08-19 LAB — TYPE AND SCREEN
ABO/RH(D): O NEG
Antibody Screen: NEGATIVE

## 2013-08-19 LAB — PROTIME-INR
INR: 0.93 (ref 0.00–1.49)
Prothrombin Time: 12.3 seconds (ref 11.6–15.2)

## 2013-08-19 LAB — APTT: aPTT: 36 seconds (ref 24–37)

## 2013-08-19 LAB — SURGICAL PCR SCREEN
MRSA, PCR: NEGATIVE
Staphylococcus aureus: NEGATIVE

## 2013-08-19 NOTE — Progress Notes (Addendum)
PCP: Dr. Loa [facility]  Pt. States Sunday she started having chest congestion, no fever,with yellow tinged sputum. On Monday Dr. Tanya Nones called in a Z-Pack for her to start taking for 5 days. States the sputum is thin, loosen and slight yellow and states she feels better. Left message for triage nurse at Dr. Bosie Helper office to give them this update on pt.  Carol Pullium from Dr. Bosie Helper office called, stated she received the message I left and will follow up with him.

## 2013-08-19 NOTE — Telephone Encounter (Addendum)
Rec'd phone call from [place], [place] at Preadmission Testing at [facility].  Reported pt. has been treated by her PCP for chest congestion and productive cough of yellow-tinged sputum; stated did not have fever.  Is presently taking a  5 day course of Z-Pak, per order PCP.  Pt. reported to the Pre-op nurse she felt the mucus was more loose, thinner, and lighter in color.  Call placed to Pre-op nurse; stated that a chest xray had been done today.  Will monitor for final results of CXR, and will make Dr. Arbie Cookey aware of symptoms.

## 2013-08-19 NOTE — Pre-Procedure Instructions (Signed)
Ayva CHISA KUSHNER  08/19/2013   Your procedure is scheduled on:  Friday Nov. 14  Report to [facility] Main Entrance "A" at 5:30 AM.  Call this number if you have problems the morning of surgery: [phone]   Remember:   Do not eat food or drink liquids after midnight.   Take these medicines the morning of surgery with A SIP OF WATER: levothyroxine   Do not wear jewelry, make-up or nail polish.  Do not wear lotions, powders, or perfumes. You may wear deodorant.  Do not shave 48 hours prior to surgery. Men may shave face and neck.  Do not bring valuables to the hospital.  [facility] is not responsible  for any belongings or valuables.               Contacts, dentures or bridgework may not be worn into surgery.  Leave suitcase in the car. After surgery it may be brought to your room.  For patients admitted to the hospital, discharge time is determined by your                treatment team.               Patients discharged the day of surgery will not be allowed to drive  home.  Name and phone number of your driver:   Special Instructions: Shower using CHG 2 nights before surgery and the night before surgery.  If you shower the day of surgery use CHG.  Use special wash - you have one bottle of CHG for all showers.  You should use approximately 1/3 of the bottle for each shower.   Please read over the following fact sheets that you were given: Pain Booklet, Coughing and Deep Breathing, MRSA Information and Surgical Site Infection Prevention

## 2013-08-20 MED ORDER — DEXTROSE 5 % IV SOLN
1.5000 g | INTRAVENOUS | Status: AC
  Administered 2013-08-21: 1.5 g via INTRAVENOUS
  Filled 2013-08-20: qty 1.5

## 2013-08-20 NOTE — Telephone Encounter (Signed)
Chest xray results reviewed with Dr. Arbie Cookey; "no evidence of pneumonia or CHF or other acute cardiopulmonary abnormality."  Dr. Arbie Cookey made aware that pt. hd previous symptoms of chest congestion and rec'd a 5 day Z-pak per PCP, and verbalized that symptoms were improving.  Plan is proceed with surgery tomorrow.  Notified pt. Of  plan to proceed with resection of right carotid aneurysm 11/14.  Verb. understanding.

## 2013-08-20 NOTE — Progress Notes (Signed)
Anesthesia chart review:  Patient is a 72 year old female scheduled for right carotid aneurysm resection on 08/21/13 by Dr. Arbie Cookey.  Other history includes former smoker, COPD, hypothyroidism, IBS, lung adenocarcinoma s/p LLL segmentectomy '12, PUD, diverticulosis, depression, hiatal hernia, dysphagia, diastolic dysfunction, anxiety, hypotension during colonoscopy, AAA (3.1 cm) and right CIA aneurysm (2.3 cm) by ultrasound 03/03/13. Recent Zpack for bronchitis.  PCP is Dr. Lynnea Ferrier.  EKG on 08/28/12 showed NSR, anteroseptal infarct (cited on prior 11/23/10 EKG), non-specific T wave abnormality.  CXR on 08/19/13 showed: There is hyperinflation consistent with COPD. There is no evidence of pneumonia or CHF or other acute cardiopulmonary abnormality. There are no findings suspicious for recurrent malignancy.   Preoperative labs noted.   At her PAT appointment patient reported feeling improvement in URI symptoms.  Dr. Bosie Helper office was notified.  CXR unremarkable, WBC WNL, afebrile.  Dr Arbie Cookey would like to proceed as planned.  Patient will be further evaluated by Dr. Arbie Cookey and her assigned anesthesiologist on the day of surgery.  Velna [facility] Short Stay Center/Anesthesiology Phone [phone] 08/20/2013 9:53 AM

## 2013-08-21 ENCOUNTER — Encounter (HOSPITAL_COMMUNITY)
Admission: RE | Disposition: A | Discharge status: Home / Self Care | Payer: Self-pay | Source: Ambulatory Visit | Attending: Vascular Surgery

## 2013-08-21 ENCOUNTER — Encounter (HOSPITAL_COMMUNITY): Payer: Self-pay | Admitting: *Deleted

## 2013-08-21 ENCOUNTER — Inpatient Hospital Stay (HOSPITAL_COMMUNITY): Payer: Medicare Other | Admitting: Certified Registered"

## 2013-08-21 ENCOUNTER — Encounter (HOSPITAL_COMMUNITY): Payer: Medicare Other | Admitting: Vascular Surgery

## 2013-08-21 ENCOUNTER — Inpatient Hospital Stay (HOSPITAL_COMMUNITY)
Admission: RE | Admit: 2013-08-21 | Discharge: 2013-08-25 | DRG: 253 | Disposition: A | Discharge status: Home / Self Care | Payer: Medicare Other | Source: Ambulatory Visit | Attending: Vascular Surgery | Admitting: Vascular Surgery

## 2013-08-21 DIAGNOSIS — Z79899 Other long term (current) drug therapy: Secondary | ICD-10-CM

## 2013-08-21 DIAGNOSIS — F329 Major depressive disorder, single episode, unspecified: Secondary | ICD-10-CM | POA: Diagnosis present

## 2013-08-21 DIAGNOSIS — Z87891 Personal history of nicotine dependence: Secondary | ICD-10-CM

## 2013-08-21 DIAGNOSIS — Z01818 Encounter for other preprocedural examination: Secondary | ICD-10-CM

## 2013-08-21 DIAGNOSIS — Z01812 Encounter for preprocedural laboratory examination: Secondary | ICD-10-CM

## 2013-08-21 DIAGNOSIS — E039 Hypothyroidism, unspecified: Secondary | ICD-10-CM | POA: Diagnosis present

## 2013-08-21 DIAGNOSIS — B370 Candidal stomatitis: Secondary | ICD-10-CM | POA: Diagnosis not present

## 2013-08-21 DIAGNOSIS — Z8711 Personal history of peptic ulcer disease: Secondary | ICD-10-CM

## 2013-08-21 DIAGNOSIS — J449 Chronic obstructive pulmonary disease, unspecified: Secondary | ICD-10-CM | POA: Diagnosis present

## 2013-08-21 DIAGNOSIS — J4489 Other specified chronic obstructive pulmonary disease: Secondary | ICD-10-CM | POA: Diagnosis present

## 2013-08-21 DIAGNOSIS — G500 Trigeminal neuralgia: Secondary | ICD-10-CM | POA: Diagnosis present

## 2013-08-21 DIAGNOSIS — I959 Hypotension, unspecified: Secondary | ICD-10-CM | POA: Diagnosis not present

## 2013-08-21 DIAGNOSIS — F3289 Other specified depressive episodes: Secondary | ICD-10-CM | POA: Diagnosis present

## 2013-08-21 DIAGNOSIS — I720 Aneurysm of carotid artery: Principal | ICD-10-CM | POA: Diagnosis present

## 2013-08-21 DIAGNOSIS — F411 Generalized anxiety disorder: Secondary | ICD-10-CM | POA: Diagnosis present

## 2013-08-21 DIAGNOSIS — Z85118 Personal history of other malignant neoplasm of bronchus and lung: Secondary | ICD-10-CM

## 2013-08-21 HISTORY — PX: ENDARTERECTOMY: SHX5162

## 2013-08-21 LAB — CREATININE, SERUM
Creatinine, Ser: 0.55 mg/dL (ref 0.50–1.10)
GFR calc Af Amer: >90 mL/min (ref >90)
GFR calc non Af Amer: >90 mL/min (ref >90)

## 2013-08-21 LAB — CBC
HCT: 35.7 % — ABNORMAL LOW (ref 36.0–46.0)
Hemoglobin: 12.2 g/dL (ref 12.0–15.0)
MCH: 31 pg (ref 26.0–34.0)
MCHC: 34.2 g/dL (ref 30.0–36.0)
MCV: 90.6 fL (ref 78.0–100.0)
Platelets: 254 10*3/uL (ref 150–400)
RBC: 3.94 MIL/uL (ref 3.87–5.11)
RDW: 13.2 % (ref 11.5–15.5)
WBC: 8.1 10*3/uL (ref 4.0–10.5)

## 2013-08-21 SURGERY — ENDARTERECTOMY, CAROTID
Anesthesia: General | Site: Neck | Laterality: Right | Wound class: Clean

## 2013-08-21 MED ORDER — ALPRAZOLAM 0.25 MG PO TABS
0.5000 mg | ORAL_TABLET | Freq: Every evening | ORAL | Status: DC | PRN
  Administered 2013-08-24: 0.5 mg via ORAL
  Filled 2013-08-21: qty 2

## 2013-08-21 MED ORDER — ACETAMINOPHEN 325 MG PO TABS
325.0000 mg | ORAL_TABLET | ORAL | Status: DC | PRN
  Administered 2013-08-22 – 2013-08-24 (×4): 650 mg via ORAL
  Filled 2013-08-21 (×4): qty 2

## 2013-08-21 MED ORDER — SODIUM CHLORIDE 0.9 % IV SOLN: 500.0000 mL | Freq: Once | INTRAVENOUS | Status: AC | PRN

## 2013-08-21 MED ORDER — NEOSTIGMINE METHYLSULFATE 1 MG/ML IJ SOLN
INTRAMUSCULAR | Status: DC | PRN
  Administered 2013-08-21: 4 mg via INTRAVENOUS

## 2013-08-21 MED ORDER — SENNOSIDES-DOCUSATE SODIUM 8.6-50 MG PO TABS
1.0000 | ORAL_TABLET | Freq: Every evening | ORAL | Status: DC | PRN
  Filled 2013-08-21: qty 1

## 2013-08-21 MED ORDER — SODIUM CHLORIDE 0.9 % IV SOLN
INTRAVENOUS | Status: DC
  Administered 2013-08-21: 21:00:00 via INTRAVENOUS

## 2013-08-21 MED ORDER — HEPARIN SODIUM (PORCINE) 1000 UNIT/ML IJ SOLN
INTRAMUSCULAR | Status: DC | PRN
  Administered 2013-08-21: 6000 [IU] via INTRAVENOUS

## 2013-08-21 MED ORDER — LIDOCAINE HCL (PF) 1 % IJ SOLN
INTRAMUSCULAR | Status: AC
  Filled 2013-08-21: qty 30

## 2013-08-21 MED ORDER — HYDROMORPHONE HCL PF 1 MG/ML IJ SOLN
INTRAMUSCULAR | Status: AC
  Administered 2013-08-21: 0.25 mg via INTRAVENOUS
  Filled 2013-08-21: qty 1

## 2013-08-21 MED ORDER — SODIUM CHLORIDE 0.9 % IR SOLN
Status: DC | PRN
  Administered 2013-08-21: 08:00:00

## 2013-08-21 MED ORDER — PROMETHAZINE HCL 25 MG/ML IJ SOLN
12.5000 mg | Freq: Four times a day (QID) | INTRAMUSCULAR | Status: DC | PRN
  Administered 2013-08-21 – 2013-08-22 (×2): 12.5 mg via INTRAVENOUS
  Administered 2013-08-23: 25 mg via INTRAVENOUS
  Filled 2013-08-21 (×3): qty 1

## 2013-08-21 MED ORDER — MAGNESIUM SULFATE 40 MG/ML IJ SOLN
2.0000 g | Freq: Once | INTRAMUSCULAR | Status: AC | PRN
  Filled 2013-08-21: qty 50

## 2013-08-21 MED ORDER — GLYCOPYRROLATE 0.2 MG/ML IJ SOLN
INTRAMUSCULAR | Status: DC | PRN
  Administered 2013-08-21: 0.2 mg via INTRAVENOUS
  Administered 2013-08-21: 0.6 mg via INTRAVENOUS

## 2013-08-21 MED ORDER — PHENYLEPHRINE HCL 10 MG/ML IJ SOLN
10.0000 mg | INTRAVENOUS | Status: DC | PRN
  Administered 2013-08-21: 10 ug/min via INTRAVENOUS

## 2013-08-21 MED ORDER — OXYCODONE HCL 5 MG/5ML PO SOLN: 5.0000 mg | Freq: Once | ORAL | Status: DC | PRN

## 2013-08-21 MED ORDER — PHENOL 1.4 % MT LIQD: 1.0000 | OROMUCOSAL | Status: DC | PRN

## 2013-08-21 MED ORDER — HYDRALAZINE HCL 20 MG/ML IJ SOLN: 10.0000 mg | INTRAMUSCULAR | Status: DC | PRN

## 2013-08-21 MED ORDER — ROCURONIUM BROMIDE 100 MG/10ML IV SOLN
INTRAVENOUS | Status: DC | PRN
  Administered 2013-08-21: 40 mg via INTRAVENOUS

## 2013-08-21 MED ORDER — LIDOCAINE HCL (CARDIAC) 20 MG/ML IV SOLN
INTRAVENOUS | Status: DC | PRN
  Administered 2013-08-21: 80 mg via INTRAVENOUS

## 2013-08-21 MED ORDER — OXYCODONE HCL 5 MG PO TABS: 5.0000 mg | ORAL_TABLET | Freq: Once | ORAL | Status: DC | PRN

## 2013-08-21 MED ORDER — MIDAZOLAM HCL 2 MG/2ML IJ SOLN: 1.0000 mg | INTRAMUSCULAR | Status: DC | PRN

## 2013-08-21 MED ORDER — LACTATED RINGERS IV SOLN
INTRAVENOUS | Status: DC | PRN
  Administered 2013-08-21 (×2): via INTRAVENOUS

## 2013-08-21 MED ORDER — PROTAMINE SULFATE 10 MG/ML IV SOLN
INTRAVENOUS | Status: DC | PRN
  Administered 2013-08-21: 20 mg via INTRAVENOUS
  Administered 2013-08-21: 10 mg via INTRAVENOUS
  Administered 2013-08-21: 20 mg via INTRAVENOUS

## 2013-08-21 MED ORDER — METOPROLOL TARTRATE 1 MG/ML IV SOLN: 2.0000 mg | INTRAVENOUS | Status: DC | PRN

## 2013-08-21 MED ORDER — ONDANSETRON HCL 4 MG/2ML IJ SOLN
4.0000 mg | Freq: Four times a day (QID) | INTRAMUSCULAR | Status: DC | PRN
  Administered 2013-08-21 – 2013-08-24 (×3): 4 mg via INTRAVENOUS
  Filled 2013-08-21 (×3): qty 2

## 2013-08-21 MED ORDER — HYDROMORPHONE HCL PF 1 MG/ML IJ SOLN
0.2500 mg | INTRAMUSCULAR | Status: DC | PRN
  Administered 2013-08-21 (×2): 0.25 mg via INTRAVENOUS

## 2013-08-21 MED ORDER — 0.9 % SODIUM CHLORIDE (POUR BTL) OPTIME
TOPICAL | Status: DC | PRN
  Administered 2013-08-21: 2000 mL

## 2013-08-21 MED ORDER — DOCUSATE SODIUM 100 MG PO CAPS
100.0000 mg | ORAL_CAPSULE | Freq: Every day | ORAL | Status: DC
  Administered 2013-08-22 – 2013-08-25 (×4): 100 mg via ORAL
  Filled 2013-08-21 (×4): qty 1

## 2013-08-21 MED ORDER — OXYCODONE-ACETAMINOPHEN 5-325 MG PO TABS: 1.0000 | ORAL_TABLET | ORAL | Status: DC | PRN

## 2013-08-21 MED ORDER — ALUM & MAG HYDROXIDE-SIMETH 200-200-20 MG/5ML PO SUSP: 15.0000 mL | ORAL | Status: DC | PRN

## 2013-08-21 MED ORDER — DIPHENHYDRAMINE HCL 25 MG PO CAPS: 25.0000 mg | ORAL_CAPSULE | Freq: Every evening | ORAL | Status: DC | PRN

## 2013-08-21 MED ORDER — ACETAMINOPHEN 650 MG RE SUPP: 325.0000 mg | RECTAL | Status: DC | PRN

## 2013-08-21 MED ORDER — DOPAMINE-DEXTROSE 3.2-5 MG/ML-% IV SOLN: 3.0000 ug/kg/min | INTRAVENOUS | Status: DC

## 2013-08-21 MED ORDER — ADULT MULTIVITAMIN W/MINERALS CH
1.0000 | ORAL_TABLET | Freq: Every day | ORAL | Status: DC
  Administered 2013-08-22 – 2013-08-25 (×4): 1 via ORAL
  Filled 2013-08-21 (×5): qty 1

## 2013-08-21 MED ORDER — ASPIRIN EC 325 MG PO TBEC
325.0000 mg | DELAYED_RELEASE_TABLET | Freq: Every day | ORAL | Status: DC
  Administered 2013-08-22 – 2013-08-25 (×4): 325 mg via ORAL
  Filled 2013-08-21 (×4): qty 1

## 2013-08-21 MED ORDER — GUAIFENESIN-DM 100-10 MG/5ML PO SYRP: 15.0000 mL | ORAL_SOLUTION | ORAL | Status: DC | PRN

## 2013-08-21 MED ORDER — PROPOFOL 10 MG/ML IV BOLUS
INTRAVENOUS | Status: DC | PRN
  Administered 2013-08-21: 100 mg via INTRAVENOUS

## 2013-08-21 MED ORDER — PANTOPRAZOLE SODIUM 40 MG PO TBEC
40.0000 mg | DELAYED_RELEASE_TABLET | Freq: Every day | ORAL | Status: DC
  Administered 2013-08-22 – 2013-08-25 (×4): 40 mg via ORAL
  Filled 2013-08-21 (×4): qty 1

## 2013-08-21 MED ORDER — SODIUM CHLORIDE 0.9 % IV SOLN: INTRAVENOUS | Status: DC

## 2013-08-21 MED ORDER — MORPHINE SULFATE 2 MG/ML IJ SOLN
2.0000 mg | INTRAMUSCULAR | Status: DC | PRN
  Administered 2013-08-21 – 2013-08-22 (×2): 2 mg via INTRAVENOUS
  Filled 2013-08-21 (×2): qty 1

## 2013-08-21 MED ORDER — FENTANYL CITRATE 0.05 MG/ML IJ SOLN
INTRAMUSCULAR | Status: DC | PRN
  Administered 2013-08-21: 100 ug via INTRAVENOUS
  Administered 2013-08-21: 50 ug via INTRAVENOUS

## 2013-08-21 MED ORDER — FENTANYL CITRATE 0.05 MG/ML IJ SOLN: 50.0000 ug | Freq: Once | INTRAMUSCULAR | Status: DC

## 2013-08-21 MED ORDER — LABETALOL HCL 5 MG/ML IV SOLN
10.0000 mg | INTRAVENOUS | Status: DC | PRN
  Filled 2013-08-21: qty 4

## 2013-08-21 MED ORDER — ARTIFICIAL TEARS OP OINT
TOPICAL_OINTMENT | OPHTHALMIC | Status: DC | PRN
  Administered 2013-08-21: 1 via OPHTHALMIC

## 2013-08-21 MED ORDER — IBUPROFEN 400 MG PO TABS
400.0000 mg | ORAL_TABLET | Freq: Four times a day (QID) | ORAL | Status: DC | PRN
  Filled 2013-08-21: qty 1

## 2013-08-21 MED ORDER — ENOXAPARIN SODIUM 30 MG/0.3ML ~~LOC~~ SOLN
30.0000 mg | SUBCUTANEOUS | Status: DC
  Administered 2013-08-21 – 2013-08-24 (×4): 30 mg via SUBCUTANEOUS
  Filled 2013-08-21 (×5): qty 0.3

## 2013-08-21 MED ORDER — POTASSIUM CHLORIDE CRYS ER 20 MEQ PO TBCR: 20.0000 meq | EXTENDED_RELEASE_TABLET | Freq: Once | ORAL | Status: AC | PRN

## 2013-08-21 MED ORDER — BISACODYL 10 MG RE SUPP: 10.0000 mg | Freq: Every day | RECTAL | Status: DC | PRN

## 2013-08-21 MED ORDER — ZOLPIDEM TARTRATE 5 MG PO TABS: 5.0000 mg | ORAL_TABLET | Freq: Every evening | ORAL | Status: DC | PRN

## 2013-08-21 MED ORDER — LEVOTHYROXINE SODIUM 25 MCG PO TABS
25.0000 ug | ORAL_TABLET | Freq: Every day | ORAL | Status: DC
  Administered 2013-08-23 – 2013-08-25 (×3): 25 ug via ORAL
  Filled 2013-08-21 (×5): qty 1

## 2013-08-21 MED ORDER — DEXTROSE 5 % IV SOLN
1.5000 g | Freq: Two times a day (BID) | INTRAVENOUS | Status: AC
  Administered 2013-08-21 – 2013-08-22 (×2): 1.5 g via INTRAVENOUS
  Filled 2013-08-21 (×2): qty 1.5

## 2013-08-21 MED ORDER — ONDANSETRON HCL 4 MG/2ML IJ SOLN
INTRAMUSCULAR | Status: DC | PRN
  Administered 2013-08-21: 4 mg via INTRAVENOUS

## 2013-08-21 MED ORDER — OXYCODONE-ACETAMINOPHEN 5-325 MG PO TABS
1.0000 | ORAL_TABLET | ORAL | Status: DC | PRN
  Administered 2013-08-21: 2 via ORAL
  Administered 2013-08-22: 1 via ORAL
  Administered 2013-08-22: 2 via ORAL
  Administered 2013-08-23: 1 via ORAL
  Administered 2013-08-23: 2 via ORAL
  Filled 2013-08-21: qty 2
  Filled 2013-08-21 (×2): qty 1
  Filled 2013-08-21 (×2): qty 2

## 2013-08-21 MED ORDER — CITALOPRAM HYDROBROMIDE 40 MG PO TABS
40.0000 mg | ORAL_TABLET | Freq: Every day | ORAL | Status: DC
  Administered 2013-08-21 – 2013-08-24 (×4): 40 mg via ORAL
  Filled 2013-08-21 (×5): qty 1

## 2013-08-21 SURGICAL SUPPLY — 46 items
APL SKNCLS STERI-STRIP NONHPOA (GAUZE/BANDAGES/DRESSINGS) ×1
BENZOIN TINCTURE PRP APPL 2/3 (GAUZE/BANDAGES/DRESSINGS) ×2 IMPLANT
CANISTER SUCTION 2500CC (MISCELLANEOUS) ×2 IMPLANT
CATH ROBINSON RED A/P 18FR (CATHETERS) ×2 IMPLANT
CLIP LIGATING EXTRA MED SLVR (CLIP) ×2 IMPLANT
CLIP LIGATING EXTRA SM BLUE (MISCELLANEOUS) ×2 IMPLANT
COVER SURGICAL LIGHT HANDLE (MISCELLANEOUS) ×2 IMPLANT
CRADLE DONUT ADULT HEAD (MISCELLANEOUS) ×2 IMPLANT
DECANTER SPIKE VIAL GLASS SM (MISCELLANEOUS) IMPLANT
DRAIN HEMOVAC 1/8 X 5 (WOUND CARE) IMPLANT
DRAPE WARM FLUID 44X44 (DRAPE) ×2 IMPLANT
DRSG COVADERM 4X8 (GAUZE/BANDAGES/DRESSINGS) ×1 IMPLANT
ELECT REM PT RETURN 9FT ADLT (ELECTROSURGICAL) ×2
ELECTRODE REM PT RTRN 9FT ADLT (ELECTROSURGICAL) ×1 IMPLANT
EVACUATOR SILICONE 100CC (DRAIN) IMPLANT
GEL ULTRASOUND 20GR AQUASONIC (MISCELLANEOUS) IMPLANT
GLOVE BIOGEL PI IND STRL 6.5 (GLOVE) IMPLANT
GLOVE BIOGEL PI IND STRL 7.5 (GLOVE) IMPLANT
GLOVE BIOGEL PI INDICATOR 6.5 (GLOVE) ×1
GLOVE BIOGEL PI INDICATOR 7.5 (GLOVE) ×1
GLOVE SS BIOGEL STRL SZ 7 (GLOVE) IMPLANT
GLOVE SS BIOGEL STRL SZ 7.5 (GLOVE) ×1 IMPLANT
GLOVE SUPERSENSE BIOGEL SZ 7 (GLOVE) ×1
GLOVE SUPERSENSE BIOGEL SZ 7.5 (GLOVE) ×1
GLOVE SURG SS PI 6.5 STRL IVOR (GLOVE) ×1 IMPLANT
GOWN STRL NON-REIN LRG LVL3 (GOWN DISPOSABLE) ×6 IMPLANT
KIT BASIN OR (CUSTOM PROCEDURE TRAY) ×2 IMPLANT
KIT ROOM TURNOVER OR (KITS) ×2 IMPLANT
NEEDLE 22X1 1/2 (OR ONLY) (NEEDLE) IMPLANT
NS IRRIG 1000ML POUR BTL (IV SOLUTION) ×4 IMPLANT
PACK CAROTID (CUSTOM PROCEDURE TRAY) ×2 IMPLANT
PAD ARMBOARD 7.5X6 YLW CONV (MISCELLANEOUS) ×4 IMPLANT
SHUNT CAROTID BYPASS 10 (VASCULAR PRODUCTS) IMPLANT
SHUNT CAROTID BYPASS 12FRX15.5 (VASCULAR PRODUCTS) IMPLANT
STRIP CLOSURE SKIN 1/2X4 (GAUZE/BANDAGES/DRESSINGS) ×2 IMPLANT
SUT ETHILON 3 0 PS 1 (SUTURE) IMPLANT
SUT PROLENE 6 0 CC (SUTURE) ×2 IMPLANT
SUT SILK 3 0 (SUTURE)
SUT SILK 3-0 18XBRD TIE 12 (SUTURE) IMPLANT
SUT VIC AB 3-0 SH 27 (SUTURE) ×4
SUT VIC AB 3-0 SH 27X BRD (SUTURE) ×2 IMPLANT
SUT VICRYL 4-0 PS2 18IN ABS (SUTURE) ×2 IMPLANT
SYR CONTROL 10ML LL (SYRINGE) IMPLANT
TOWEL OR 17X24 6PK STRL BLUE (TOWEL DISPOSABLE) ×2 IMPLANT
TOWEL OR 17X26 10 PK STRL BLUE (TOWEL DISPOSABLE) ×2 IMPLANT
WATER STERILE IRR 1000ML POUR (IV SOLUTION) ×2 IMPLANT

## 2013-08-21 NOTE — Transfer of Care (Signed)
Immediate Anesthesia Transfer of Care Note  Patient: [name]  Procedure(s) Performed: Procedure(s): RIGHT CAROTID ANEURYSM RESECTION (Right)  Patient Location: PACU  Anesthesia Type:General  Level of Consciousness: awake, alert , oriented and patient cooperative  Airway & Oxygen Therapy: Patient connected to face mask oxygen  Post-op Assessment: Report given to PACU RN  Post vital signs: stable  Complications: No apparent anesthesia complications

## 2013-08-21 NOTE — Op Note (Signed)
OPERATIVE REPORT  DATE OF SURGERY: 08/21/2013  PATIENT: [name], 72 y.o. female MRN: [id number]  DOB: 1941/06/27  PRE-OPERATIVE DIAGNOSIS: Right internal carotid 3 cm aneurysm  POST-OPERATIVE DIAGNOSIS:  Same  PROCEDURE: Resection of right internal carotid aneurysm with primary end-to-end repair  SURGEON:  Gretta Began, M.D.  PHYSICIAN ASSISTANT: Collins  ANESTHESIA:  Gen.  EBL: Minimal ml  Total I/O In: 1000 [I.V.:1000] Out: -   BLOOD ADMINISTERED: None  DRAINS: None  SPECIMEN: Right carotid aneurysm  COUNTS CORRECT:  YES  PLAN OF CARE: PACU neurologically intact   PATIENT DISPOSITION:  PACU - hemodynamically stable  PROCEDURE DETAILS: The patient is a 72 year old female who had an MRI for evaluation of left trigeminal neuralgia. MRI showed right internal carotid aneurysm she subsequently underwent a CT scan for further evaluation of this and saw me for further recommendation. Patient had a large 3 cm artery aneurysm that arose off tortuosity of her internal carotid artery. Do to the size I felt that observation was not appropriate. There was no option for any endovascular treatment of this. I recommended resection and restore continuity with end-to-end internal carotid anastomosis due to the tortuosity.  The patient was taken to the operating room placed supine position where the area of the right neck was prepped and draped in usual sterile fashion an incision was made just below the earlobe anterior sternocleidomastoid and carried down to the platysma with electrocautery. Surgical mass effect posteriorly and the carotid sheath was opened. The internal carotid external carotid and common carotid arteries were identified. The external and internal carotid were circled with vessel loops. There was minimal atherosclerotic changes in the carotid arteries. The patient had a large aneurysm and this was exposed. The hypoglossal and vagus nerves were divided and  preserved. The internal carotid could be identified entering aneurysm as was seen on CT scan on the posterior aspect of the aneurysm. The internal carotid exit of the aneurysm just anterior to this on the lower pole of the aneurysm as well. This then looped around the aneurysm. The internal carotid was controlled distal to the aneurysm as well. The caliber was smaller on the internal carotid distal to the aneurysm. The patient was given 7 6000 intravenous heparin and after adequate circulation time the internal carotid artery was occluded at the bifurcation and also above the aneurysm. Care was taken to keep the blood pressure and adequate level. The internal carotid artery was divided as it entered the aneurysm and as it exited the aneurysm. The aneurysm was opened in the lower portion was resected. It was no backbleeding from any other branch in the aneurysm. The internal carotid distally as was the smaller with some spasm. A 2-1/2 and 3 dilator passed easily and there was good backbleeding. The the distal internal carotid was spatulated. The internal carotid above the bifurcation was cut to the appropriate length and was also spatulated and sewn end to end to the distal internal carotid artery with a running 6-0 Prolene suture. Prior to completion of the closure the usual flushing maneuvers were undertaken. Anastomosis was completed and flow is restored to the internal carotid artery. Carotid occlusion time was 14 minutes. There was excellent hemostasis. The patient was given 50 mg of protamine to reverse the heparin. Wound irrigated with saline. Hemostasis daily cautery. Wounds were closed with several 3-0 Vicryl sutures reapproximated sternocleidomastoid over the carotid sheath. The platysma was closed running through buccal suture and the skin was closed with 4 subcuticular  Vicryl stitch. Benzoin and Steri-Strips were applied. The patient was taken to the recovery room after being awakened in the operating  room neurologically intact.   Gretta Began, M.D. 08/21/2013 9:30 AM

## 2013-08-21 NOTE — Anesthesia Preprocedure Evaluation (Addendum)
Anesthesia Evaluation  Patient identified by MRN, date of birth, ID band Patient awake    Reviewed: Allergy & Precautions, H&P , NPO status , Patient's Chart, lab work & pertinent test results, reviewed documented beta blocker date and time   History of Anesthesia Complications (+) AWARENESS UNDER ANESTHESIA  Airway Mallampati: I TM Distance: >3 FB Neck ROM: Full    Dental   Pulmonary shortness of breath and with exertion, COPDformer smoker,  + rhonchi         Cardiovascular + Peripheral Vascular Disease Rhythm:Regular Rate:Normal     Neuro/Psych Depression  Neuromuscular disease    GI/Hepatic hiatal hernia, PUD,   Endo/Other  Hypothyroidism   Renal/GU      Musculoskeletal   Abdominal   Peds  Hematology   Anesthesia Other Findings   Reproductive/Obstetrics                         Anesthesia Physical Anesthesia Plan  ASA: III  Anesthesia Plan: General   Post-op Pain Management:    Induction: Intravenous  Airway Management Planned: Oral ETT  Additional Equipment: Arterial line  Intra-op Plan:   Post-operative Plan: Extubation in OR  Informed Consent: I have reviewed the patients History and Physical, chart, labs and discussed the procedure including the risks, benefits and alternatives for the proposed anesthesia with the patient or authorized representative who has indicated his/her understanding and acceptance.     Plan Discussed with: CRNA and Surgeon  Anesthesia Plan Comments:         Anesthesia Quick Evaluation

## 2013-08-21 NOTE — H&P (View-Only) (Signed)
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 I seen her in the past for evaluation of his small abdominal aortic aneurysm and this is been followed with serial ultrasounds. Size of this is just over 3 cm. She recently was undergoing workup for trigeminal neuralgia with an MR I have her brain. 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 The trigeminal neuralgia is on the left side. She had no known history of carotid aneurysm and has had no symptoms related to this. Specifically no amaurosis fugax transient ischemic attack or stroke. She has no neck pain on the right side. She is right-handed. She does have a history of prior left lung surgery for cancer and has had no evidence of recurrent disease. 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  Past Medical History  Diagnosis Date  . COPD (chronic obstructive pulmonary disease)   . Anxiety disorder   . Hypothyroidism   . PUD (peptic ulcer disease)     30yrs  . Dysphagia   . IBS (irritable bowel syndrome)   . Diverticulosis   . Trigeminal neuralgia   . Diastolic dysfunction   . Depression   . Allergic rhinitis   . 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 Hiatal hernia   . Aortic aneurysm     History  Substance Use Topics  . Smoking status: Former Smoker -- 0.50 packs/day for 20 years    Types: Cigarettes    Quit date: 12/21/2010  . Smokeless tobacco: Never Used     Comment: smoking cessation info given and reviewed   . Alcohol Use: No     Comment: occasional    Family History  Problem Relation Age of Onset  . Liver cancer Sister   . Cancer Sister   . Pulmonary embolism Mother   . Heart attack Father   . Hypertension Father   . Cancer Brother   . Cancer Daughter     Allergies  Allergen Reactions  . Ciprofloxacin Hcl Hives  . Iohexol      Desc: [facility], pt is allergic to IV contrast, no type of reaction was available. Pt cannot remember but first reacted around 15 yrs ago from an IVP. Notes were date from 2009 at urology center., Onset Date: [id number]   . Prozac [Fluoxetine Hcl] Rash  . Sulfonamide Derivatives Hives and Rash    Current outpatient prescriptions:acetylcysteine (MUCOMYST) 20 % nebulizer solution, 600mg BID 1 day prior to CT and day of, Disp: 9 mL, Rfl: 0;  alendronate (FOSAMAX) 70 MG tablet, Take 1 tablet by mouth  weekly, Disp: 12 tablet, Rfl: 3;  ALPRAZolam (XANAX) 0.5 MG tablet, Take 1 tablet (0.5 mg total) by mouth 3 (three) times daily as needed for anxiety., Disp: 30 tablet, Rfl: 2 calcium citrate-vitamin D (CITRACAL+D) 315-200 MG-UNIT per tablet, Take 1 tablet by mouth daily. , Disp: , Rfl: ;  carbamazepine (TEGRETOL-XR) 100 MG 12 hr tablet, Take 1 tablet (100 mg total) by mouth 2 (two) times daily., Disp: 60 tablet, Rfl: 0;  Cholecalciferol (VITAMIN D-3) 5000 UNITS TABS, Take 1 tablet by mouth daily., Disp: , Rfl: ;  citalopram (CELEXA) 40 MG tablet, Take 1 tablet (40 mg total) by mouth daily., Disp: 90 tablet, Rfl: 3 diphenhydrAMINE (BENADRYL) 50 MG capsule, Take 1 capsule (50 mg total) by mouth every   6 (six) hours as needed for itching., Disp: 30 capsule, Rfl: 0;  fish oil-omega-3 fatty acids 1000 MG capsule, Take 1 g by mouth daily. , Disp: , Rfl: ;  levothyroxine (SYNTHROID, LEVOTHROID) 25 MCG tablet, Take 1 tablet by mouth  every day. Equivalent to Levoxyl, Disp: 90 tablet, Rfl: 3 Multiple Vitamin (MULTIVITAMIN WITH MINERALS) TABS, Take 1 tablet by mouth daily., Disp: , Rfl: ;  pantoprazole (PROTONIX) 40 MG tablet, Take 1 tablet (40 mg total) by mouth daily., Disp: 90 tablet, Rfl: 3;  predniSONE (DELTASONE) 50 MG tablet, 1 tab po 13 hours, 7 hours and 1 hour prior to CT scan., Disp: 3 tablet, Rfl: 0;  triamcinolone cream (KENALOG) 0.1 %, Apply topically 2 (two) times daily., Disp: 45 g, Rfl: 1  BP 104/77  Pulse 69  Ht  5' 5.5" (1.664 m)  Wt 134 lb (60.782 kg)  BMI 21.95 kg/m2  SpO2 94%  Body mass index is 21.95 kg/(m^2).       Physical exam: Well-developed well-nourished thin white female no acute distress Her aneurysm is palpable at the angle of her jaw on the right and is nontender. She has no bruits bilaterally. Neurologically she is grossly intact Respirations equal and nonlabored with no wheezing Heart regular rate and rhythm Abdomen soft nontender no aneurysm palpable Pulse status 2+ radial and 2+ dorsalis pedis pulses bilaterally Skin without ulcers or rashes  Angiogram of her neck was reviewed which was ordered following the MRI. This does show a 3 cm saccular aneurysm. She is extremely tortuous internal carotid artery after the bifurcation and this aneurysm actually arises off a lower loop of the tortuous carotid artery. 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 I do not feel comfortable with observation of this to explain the only option for treatment would be surgery. I explained that with her extreme redundancy that I feel very likely that we could exclude the aneurysm with end-to-end anastomosis of the normal internal carotid artery. I explained a slight risk of stroke in 1-2% a slight risk of cranial nerve injury as well. She understands and wished to proceed as soon as possible. We have scheduled her surgery for November 14th 

## 2013-08-21 NOTE — Interval H&P Note (Signed)
History and Physical Interval Note:  08/21/2013 7:19 AM  [name]  has presented today for surgery, with the diagnosis of RIGHT CAROTID ANEURYSM  The various methods of treatment have been discussed with the patient and family. After consideration of risks, benefits and other options for treatment, the patient has consented to  Procedure(s): RIGHT CAROTID ANEURYSM RESECTION (Right) as a surgical intervention .  The patient's history has been reviewed, patient examined, no change in status, stable for surgery.  I have reviewed the patient's chart and labs.  Questions were answered to the patient's satisfaction.     [name]

## 2013-08-21 NOTE — Anesthesia Postprocedure Evaluation (Signed)
  Anesthesia Post-op Note  Patient: [name]  Procedure(s) Performed: Procedure(s): RIGHT CAROTID ANEURYSM RESECTION (Right)  Patient Location: PACU  Anesthesia Type:General  Level of Consciousness: awake and alert   Airway and Oxygen Therapy: Patient Spontanous Breathing  Post-op Pain: mild  Post-op Assessment: Post-op Vital signs reviewed, Patient's Cardiovascular Status Stable, Respiratory Function Stable, Patent Airway, No signs of Nausea or vomiting and Pain level controlled  Post-op Vital Signs: Reviewed and stable  Complications: No apparent anesthesia complications

## 2013-08-21 NOTE — Anesthesia Procedure Notes (Signed)
Procedure Name: Intubation Date/Time: 08/21/2013 7:34 AM Performed by: Ellin Goodie Pre-anesthesia Checklist: Patient identified, Emergency Drugs available, Suction available, Patient being monitored and Timeout performed Patient Re-evaluated:Patient Re-evaluated prior to inductionOxygen Delivery Method: Circle system utilized Preoxygenation: Pre-oxygenation with 100% oxygen Intubation Type: IV induction Ventilation: Mask ventilation without difficulty Laryngoscope Size: Mac and 3 Grade View: Grade I Tube type: Oral Tube size: 7.5 mm Number of attempts: 1 Airway Equipment and Method: Stylet Placement Confirmation: ETT inserted through vocal cords under direct vision,  positive ETCO2 and breath sounds checked- equal and bilateral Secured at: 21 cm Tube secured with: Tape Dental Injury: Teeth and Oropharynx as per pre-operative assessment  Comments: Easy atraumatic induction and intubation with MAC 3 blade.  Dr. Gypsy Balsam verified placement of ETT.  Carlynn Herald, CRNA

## 2013-08-21 NOTE — Progress Notes (Signed)
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 female who is Day of Surgery . Patient is doing well.  Patient denies headache; Patient denies difficulty swallowing; denies weakness in upper or lower extremities; Pt. denies other symptoms of stroke or TIA.   Significant Diagnostic Studies: CBC Lab Results  Component Value Date   WBC 7.7 08/19/2013   HGB 13.8 08/19/2013   HCT 39.8 08/19/2013   MCV 89.2 08/19/2013   PLT 286 08/19/2013    BMET    Component Value Date/Time   NA 134* 08/19/2013 1202   K 4.0 08/19/2013 1202   CL 95* 08/19/2013 1202   CO2 29 08/19/2013 1202   GLUCOSE 103* 08/19/2013 1202   BUN 13 08/19/2013 1202   CREATININE 0.62 08/19/2013 1202   CALCIUM 9.5 08/19/2013 1202   GFRNONAA 88* 08/19/2013 1202   GFRAA >90 08/19/2013 1202    COAG Lab Results  Component Value Date   INR 0.93 08/19/2013   INR 0.95 12/19/2010   INR 0.96 11/23/2010   No results found for this basename: PTT      Intake/Output Summary (Last 24 hours) at 08/21/13 1610 Last data filed at 08/21/13 1600  Gross per 24 hour  Intake   1760 ml  Output      0 ml  Net   1760 ml    Physical Exam:  BP Readings from Last 3 Encounters:  08/21/13 116/57  08/21/13 116/57  08/19/13 98/59   Temp Readings from Last 3 Encounters:  08/21/13 98.2 F (36.8 C) Oral  08/21/13 98.2 F (36.8 C) Oral  08/19/13 98.1 F (36.7 C)    SpO2 Readings from Last 3 Encounters:  08/21/13 99%  08/21/13 99%  08/19/13 92%   Pulse Readings from Last 3 Encounters:  08/21/13 70  08/21/13 70  08/19/13 78    Pt is A&O x 3 Speech is fluent right Neck Wound is soft with dressing intact  Patient with Positive tongue deviation and Negative facial droop Pt has good and equal strength in all extremities  Assessment/Plan::  [name] is a 72 y.o. 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

## 2013-08-21 NOTE — Progress Notes (Signed)
   CARE MANAGEMENT NOTE 08/21/2013  Patient:  [name], [name]   Account Number:  [id number]  Date Initiated:  08/21/2013  Documentation initiated by:  Darlyne Russian  Subjective/Objective Assessment:   Patient admitted with right carotid aneurysm.     Action/Plan:   08/21/2013 to surgery for resection of right carotid endarectomy  NCM to follow for discharg planning needs.   Anticipated DC Date:     Anticipated DC Plan:           Choice offered to / List presented to:             Status of service:  In process, will continue to follow Medicare Important Message given?   (If response is "NO", the following Medicare IM given date fields will be blank) Date Medicare IM given:   Date Additional Medicare IM given:    Discharge Disposition:    Per UR Regulation:  Reviewed for med. necessity/level of care/duration of stay  If discussed at Long Length of Stay Meetings, dates discussed:    Comments:

## 2013-08-21 NOTE — Progress Notes (Signed)
Utilization review completed.  

## 2013-08-22 LAB — CBC
HCT: 33.9 % — ABNORMAL LOW (ref 36.0–46.0)
Hemoglobin: 11.7 g/dL — ABNORMAL LOW (ref 12.0–15.0)
MCH: 31.3 pg (ref 26.0–34.0)
MCHC: 34.5 g/dL (ref 30.0–36.0)
MCV: 90.6 fL (ref 78.0–100.0)
Platelets: 258 10*3/uL (ref 150–400)
RBC: 3.74 MIL/uL — ABNORMAL LOW (ref 3.87–5.11)
RDW: 13.3 % (ref 11.5–15.5)
WBC: 6 10*3/uL (ref 4.0–10.5)

## 2013-08-22 LAB — BASIC METABOLIC PANEL
BUN: 3 mg/dL — ABNORMAL LOW (ref 6–23)
CO2: 27 mEq/L (ref 19–32)
Calcium: 8.2 mg/dL — ABNORMAL LOW (ref 8.4–10.5)
Chloride: 103 mEq/L (ref 96–112)
Creatinine, Ser: 0.56 mg/dL (ref 0.50–1.10)
GFR calc Af Amer: >90 mL/min (ref >90)
GFR calc non Af Amer: >90 mL/min (ref >90)
Glucose, Bld: 86 mg/dL (ref 70–99)
Potassium: 4.2 mEq/L (ref 3.5–5.1)
Sodium: 137 mEq/L (ref 135–145)

## 2013-08-22 LAB — GLUCOSE, CAPILLARY: Glucose-Capillary: 114 mg/dL — ABNORMAL HIGH (ref 70–99)

## 2013-08-22 MED ORDER — SODIUM CHLORIDE 0.9 % IV SOLN
INTRAVENOUS | Status: AC
  Administered 2013-08-22: 1000 mL via INTRAVENOUS

## 2013-08-22 NOTE — Progress Notes (Addendum)
[facility]  Daily Progress Note  Assessment/Planning: POD #1 s/p R carotid aneurysm excision and primary repair of carotid artery   Hypotensive this AM: will bolus fluid and add dopamine drip as needed to temporarize likely carotid body mediated hypotension  Home once hemodynamically stable  Subjective  - 1 Day Post-Op  No complaints  Objective Filed Vitals:   08/22/13 0404 08/22/13 0645 08/22/13 0655 08/22/13 0800  BP: 84/45  89/46   Pulse: 63  70   Temp: 98.5 F (36.9 C)   98.3 F (36.8 C)  TempSrc: Oral   Oral  Resp: 15  19   Height:      Weight:      SpO2: 96% 89% 86%     Intake/Output Summary (Last 24 hours) at 08/22/13 0854 Last data filed at 08/22/13 0800  Gross per 24 hour  Intake 3427.5 ml  Output   2075 ml  Net 1352.5 ml    PULM  CTAB CV  RRR GI  soft, NTND NECK R neck soft, bandaged NEURO CN gross intact, min R tongue deviation, Motor intact and symmetric  Laboratory CBC    Component Value Date/Time   WBC 6.0 08/22/2013 0421   HGB 11.7* 08/22/2013 0421   HCT 33.9* 08/22/2013 0421   PLT 258 08/22/2013 0421    BMET    Component Value Date/Time   NA 137 08/22/2013 0421   K 4.2 08/22/2013 0421   CL 103 08/22/2013 0421   CO2 27 08/22/2013 0421   GLUCOSE 86 08/22/2013 0421   BUN 3* 08/22/2013 0421   CREATININE 0.56 08/22/2013 0421   CALCIUM 8.2* 08/22/2013 0421   GFRNONAA >90 08/22/2013 0421   GFRAA >90 08/22/2013 0421    Leonides Sake, MD [facility] [place] Office: [phone] Pager: [phone]  08/22/2013, 8:54 AM  Addendum  Ceasar Mons Vitals:   08/23/13 0009 08/23/13 0428 08/23/13 0801 08/23/13 1258  BP: 90/49 99/58 90/51  104/67  Pulse: 76 74 63 100  Temp: 97.8 F (36.6 C) 98 F (36.7 C) 98.3 F (36.8 C) 97.3 F (36.3 C)  TempSrc: Oral Oral Oral Oral  Resp: 13 19 13 18   Height:      Weight:      SpO2: 96% 92% 91% 90%    I rechecked this patient's BP with an  appropriately size BP cuff and she has a normal BP (SBP 123) and off oxygen at this time.  I will D/C her at this time.  Leonides Sake, MD [facility] Office: [phone] Pager: [phone]  08/23/2013, 2:33 PM

## 2013-08-22 NOTE — Progress Notes (Signed)
Called son Reuel Boom to update him on patient's status.  Will continue to monitor.  Vivi Martens RN

## 2013-08-23 NOTE — Progress Notes (Signed)
[facility]  Daily Progress Note  Assessment/Planning: POD #2 s/p R carotid aneurysm resection, primary carotid repair   BP more stable last 24 hours: SBP ~100, if stable throughout afternoon, will d/c in late afternoon  Transfer to 2W while waiting  Subjective  - 2 Days Post-Op  No complaints  Objective Filed Vitals:   08/22/13 1917 08/23/13 0009 08/23/13 0428 08/23/13 0801  BP: 99/54 90/49 99/58  90/51  Pulse: 81 76 74 63  Temp: 98.5 F (36.9 C) 97.8 F (36.6 C) 98 F (36.7 C)   TempSrc: Oral Oral Oral   Resp: 22 13 19 13   Height:      Weight:      SpO2: 93% 96% 92% 91%    Intake/Output Summary (Last 24 hours) at 08/23/13 0809 Last data filed at 08/23/13 0800  Gross per 24 hour  Intake    360 ml  Output   1600 ml  Net  -1240 ml    PULM  CTAB, SaO2 > 92% on RA CV  RRR GI  soft, NTND NECK Bandaged, no obvious hematoma NEURO MS 5/5 sym, mild tongue deviation to R  Laboratory CBC    Component Value Date/Time   WBC 6.0 08/22/2013 0421   HGB 11.7* 08/22/2013 0421   HCT 33.9* 08/22/2013 0421   PLT 258 08/22/2013 0421    BMET    Component Value Date/Time   NA 137 08/22/2013 0421   K 4.2 08/22/2013 0421   CL 103 08/22/2013 0421   CO2 27 08/22/2013 0421   GLUCOSE 86 08/22/2013 0421   BUN 3* 08/22/2013 0421   CREATININE 0.56 08/22/2013 0421   CALCIUM 8.2* 08/22/2013 0421   GFRNONAA >90 08/22/2013 0421   GFRAA >90 08/22/2013 0421    Leonides Sake, MD [facility] [place] Office: [phone] Pager: [phone]  08/23/2013, 8:09 AM

## 2013-08-23 NOTE — Progress Notes (Signed)
Pt tx 2 west per MD oder, pt VSS, pt verbalized understanding of tx, pt tol well, report called to receiving RN, all questions answered

## 2013-08-23 NOTE — Progress Notes (Signed)
Pt with continued nausea despite admin of ordered PRN, and other comfort measures.  Pt does not wish to go home alone this evening.  MD Imogene Burn agreeable to Pt staying another night.  Will con't to monitor and intervene.

## 2013-08-24 ENCOUNTER — Encounter (HOSPITAL_COMMUNITY): Payer: Self-pay | Admitting: Vascular Surgery

## 2013-08-24 ENCOUNTER — Telehealth: Payer: Self-pay | Admitting: Vascular Surgery

## 2013-08-24 MED ORDER — FLUTICASONE PROPIONATE 50 MCG/ACT NA SUSP
2.0000 | Freq: Two times a day (BID) | NASAL | Status: DC | PRN
  Administered 2013-08-24: 2 via NASAL
  Filled 2013-08-24 (×2): qty 16

## 2013-08-24 NOTE — Progress Notes (Signed)
08/24/2013 1500 Nursing note  Incentive spirometry education provided to patient with teach back. Pt. Performed returned demonstration of correct procedure. IS achieved, 1000. Encouraged use with attempts to wean o2. Will continue to monitor patient.  Tayah Idrovo, Blanchard Kelch

## 2013-08-24 NOTE — Progress Notes (Signed)
08/24/2013 1200 Nursing note  Attempted to wean pt. To room air. Pt. Oxygen saturation at rest on room air 86%. Placed back on 1L [place]. Oxygen saturation raised to 92%.  Zerah Hilyer, Blanchard Kelch

## 2013-08-24 NOTE — Telephone Encounter (Addendum)
Message copied by Fredrich Birks on Mon Aug 24, 2013  9:23 AM ------      Message from: Marlowe Shores      Created: Fri Aug 21, 2013  3:52 PM       2 week F/U carotid aneurysm resection - Early ------  08/24/13: lm for patient, dpm

## 2013-08-24 NOTE — Progress Notes (Signed)
  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 Passing flatus.   PHYSICAL EXAM: BP Readings from Last 3 Encounters:  08/24/13 112/59  08/24/13 112/59  08/19/13 98/59   Temp Readings from Last 3 Encounters:  08/24/13 98.2 F (36.8 C) Oral  08/24/13 98.2 F (36.8 C) Oral  08/19/13 98.1 F (36.7 C)    Pulse Readings from Last 3 Encounters:  08/24/13 80  08/24/13 80  08/19/13 78   SpO2 Readings from Last 3 Encounters:  08/24/13 91%  08/24/13 91%  08/19/13 92%     Intake/Output Summary (Last 24 hours) at 08/24/13 0908 Last data filed at 08/23/13 1700  Gross per 24 hour  Intake    360 ml  Output      0 ml  Net    360 ml    Neck: incision healing well,no hematoma Neuro Exam: upper and lower extremities 5/5 motor, no facial assymmetry, speech fluent Lungs with some rhonchi  LABS: Lab Results  Component Value Date   WBC 6.0 08/22/2013   HGB 11.7* 08/22/2013   HCT 33.9* 08/22/2013   MCV 90.6 08/22/2013   PLT 258 08/22/2013   Lab Results  Component Value Date   CREATININE 0.56 08/22/2013   Lab Results  Component Value Date   INR 0.93 08/19/2013     ASSESSMENT: PLAN 3 Days Post-Op 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 Will accept O2 SAT 90  IS DC percocet and just try tylenol Ambulate  [name] 08/24/2013 9:08 AM

## 2013-08-25 MED ORDER — NYSTATIN 100000 UNIT/ML MT SUSP: 5.0000 mL | Freq: Four times a day (QID) | OROMUCOSAL | Status: DC

## 2013-08-25 NOTE — Discharge Summary (Signed)
[facility] Discharge Summary   Patient ID:  [name] MRN: [id number] DOB/AGE: [birth year] 72 y.o.  Admit date: 08/21/2013 Discharge date: 08/25/2013 Date of Surgery: 08/21/2013 Surgeon: Surgeon(s): Larina Earthly, MD  Admission Diagnosis: rIGHT CAROTID ANEURYSM  Discharge Diagnoses:  rIGHT CAROTID ANEURYSM nausea  Secondary Diagnoses: Past Medical History  Diagnosis Date  . COPD (chronic obstructive pulmonary disease)   . Anxiety disorder   . Hypothyroidism   . PUD (peptic ulcer disease)     27yrs  . Dysphagia   . IBS (irritable bowel syndrome)   . Diverticulosis   . Trigeminal neuralgia   . Diastolic dysfunction   . Depression   . Allergic rhinitis   . 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 Hiatal hernia   . Aortic aneurysm   . Complication of anesthesia 2011    bloodpressure dropped during colonoscopy, not problems since  . Shortness of breath     with exertion    Procedure(s): RIGHT CAROTID ANEURYSM RESECTION  Discharged Condition: good  HPI:  [name] is a 72 y.o. female was seen by Dr Early for evaluation of right carotid aneurysm. She was seen  in the past for evaluation of her small abdominal aortic aneurysm and this is been followed with serial ultrasounds. Size of this is just over 3 cm. She recently was undergoing workup for trigeminal neuralgia with an MR of her brain. 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 The trigeminal neuralgia is on the left side. She had no known history of carotid aneurysm and has had no symptoms related to this. Specifically no amaurosis fugax transient ischemic attack or stroke. She has no neck pain on the right side. She is right-handed. She does have a history of prior left lung surgery for cancer and has had no evidence of recurrent disease. 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 She is admitted for resection of right carotid aneurysm.  Hospital Course:  [name] is a 71 y.o. female is S/P  RIGHT CAROTID ANEURYSM RESECTION Extubated: POD # 0 Post-op the pt developed nausea and O2 sats were down into 80's without O2. Her nausea resolved after percocet was discontinued o2 Sat improved to 90 on RA Physical exam: Pt is A&O x 3 Gait is normal Speech is fluent right Neck Wound is healed Positive tongue deviation is resolving Negative facial droop Pt has good and equal strength in all extremities  Post-op wounds healing well Pt. Ambulating, voiding and taking PO diet without difficulty. Pt pain controlled with PO pain meds. Labs as below Complications:thrush in mouth - nystatin ordered  Consults:     Significant Diagnostic Studies: CBC Lab Results  Component Value Date   WBC 6.0 08/22/2013   HGB 11.7* 08/22/2013   HCT 33.9* 08/22/2013   MCV 90.6 08/22/2013   PLT 258 08/22/2013    BMET    Component Value Date/Time   NA 137 08/22/2013 0421   K 4.2 08/22/2013 0421   CL 103 08/22/2013 0421   CO2 27 08/22/2013 0421   GLUCOSE 86 08/22/2013 0421   BUN 3* 08/22/2013 0421   CREATININE 0.56 08/22/2013 0421   CALCIUM 8.2* 08/22/2013 0421   GFRNONAA >90 08/22/2013 0421   GFRAA >90 08/22/2013 0421   COAG Lab Results  Component Value Date   INR  0.93 08/19/2013   INR 0.95 12/19/2010   INR 0.96 11/23/2010     Disposition:  Discharge to :Home Discharge Orders   Future Appointments Provider Department Dept Phone   08/27/2013 10:15 AM [name], MD [facility] [phone]   09/08/2013 8:45 AM Larina Earthly, MD [facility] [phone]   10/26/2013 9:00 AM [facility] [phone]   10/26/2013 10:15 AM [facility] [address] IMAGING [phone]   Patient to arrive 15 minutes prior to appointment time.   10/28/2013 11:30 AM Ellouise Newer, PA-C [facility] [phone]   03/09/2014 8:30 AM [facility] [address] [phone]   Eat a light meal the night before the exam but please avoid gaseous foods.   Nothing to eat or drink for at least 8 hours prior to the exam. No gum chewing or smoking the morning of the exam. Please take your morning medications with small sips of water, especially blood pressure medication. If you have several vascular lab exams and will see physician, please bring a snack with you.   03/09/2014 9:30 AM Larina Earthly, MD [facility] [phone]   Future Orders Complete By Expires   Call MD for:  redness, tenderness, or signs of infection (pain, swelling, bleeding, redness, odor or green/yellow discharge around incision site)  As directed    Call MD for:  redness, tenderness, or signs of infection (pain, swelling, bleeding, redness, odor or green/yellow discharge around incision site)  As directed    Call MD for:  severe or increased pain, loss or decreased feeling  in affected limb(s)  As directed    Call MD for:  severe or increased pain, loss or decreased feeling  in affected limb(s)  As directed    Call MD for:  temperature >100.5  As directed    Call MD for:  temperature >100.5  As directed    CAROTID Sugery: Call MD for difficulty swallowing or speaking; weakness in arms or legs that is a new symtom; severe headache.  If you have increased swelling in the neck and/or  are having difficulty breathing, CALL 911  As directed    Driving Restrictions  As directed    Comments:     No driving for 2 weeks   Driving Restrictions  As directed    Comments:     No driving while on narcotics   Increase activity slowly  As directed    Comments:     Walk with assistance use walker or cane as needed   Increase activity slowly  As directed    Comments:     Walk with assistance use walker or cane as needed   Lifting restrictions  As directed    Comments:     No  lifting for 4 weeks   Lifting restrictions  As directed    Comments:     No lifting for 2 weeks   May shower   As directed    Scheduling Instructions:     Sunday   may wash over wound with mild soap and water  As directed    No dressing needed  As directed    Remove dressing in 24 hours  As directed    Resume previous diet  As directed    Resume previous diet  As directed        Medication List  alendronate 70 MG tablet  Commonly known as:  FOSAMAX  Take 70 mg by mouth once a week. Take with a full glass of water on an empty stomach. Take on sundays     ALPRAZolam 0.5 MG tablet  Commonly known as:  XANAX  Take 0.5 mg by mouth at bedtime as needed for sleep.     azithromycin 250 MG tablet  Commonly known as:  ZITHROMAX  2 tabs poqday 1, 1 tab poqday 2-5     calcium citrate-vitamin D 315-200 MG-UNIT per tablet  Commonly known as:  CITRACAL+D  Take 1 tablet by mouth daily.     citalopram 40 MG tablet  Commonly known as:  CELEXA  Take 40 mg by mouth at bedtime.     diphenhydrAMINE 25 mg capsule  Commonly known as:  BENADRYL  Take 25 mg by mouth at bedtime as needed for sleep.     fish oil-omega-3 fatty acids 1000 MG capsule  Take 1 g by mouth daily.     ibuprofen 200 MG tablet  Commonly known as:  ADVIL,MOTRIN  Take 400 mg by mouth every 6 (six) hours as needed (for pain).     levothyroxine 25 MCG tablet  Commonly known as:  SYNTHROID, LEVOTHROID  Take 25 mcg by mouth daily before breakfast.     multivitamin with minerals Tabs tablet  Take 1 tablet by mouth daily.     nystatin 100000 UNIT/ML suspension  Commonly known as:  MYCOSTATIN  Take 5 mLs (500,000 Units total) by mouth 4 (four) times daily.     oxyCODONE-acetaminophen 5-325 MG per tablet  Commonly known as:  PERCOCET/ROXICET  Take 1-2 tablets by mouth every 4 (four) hours as needed for moderate pain.     Vitamin D-3 5000 UNITS Tabs  Take 5,000 Units by mouth daily.       Verbal and  written Discharge instructions given to the patient. Wound care per Discharge AVS     Follow-up Information   Follow up with EARLY, TODD, MD In 2 weeks. (office will arrange)    Specialty:  Vascular Surgery   Contact information:   [address] [place] [address] [phone]       Signed: Marlowe Shores 08/25/2013, 10:48 AM

## 2013-08-25 NOTE — Progress Notes (Signed)
08/25/2013 12:17 PM Nursing note Discharge avs form, medications already taken today and those due this evening given and explained to patient. rx given and reviewed with patient. Follow up appointments, incision site care and when to call MD reviewed. D/c iv line. D/c tele. D/c home with family per orders.  Josiah Nieto, Blanchard Kelch

## 2013-08-25 NOTE — Progress Notes (Addendum)
08/25/2013 11:50 AM Nursing note Pt. Resting room air saturation initially noted at 89% with PA at bedside. RN called to room asked by pt. To recheck on room air. Oxygen saturation 84% on room air. Pt. Ambulated in hallway on room air, saturations for majority of walk remained between 88-89% on room air. Saturations at end of walk dropped to 85%. 2LNC [employer] and increased to 92%. Della Goo [facility] paged and made aware. No orders received. Verbal orders ok to discharge patient on room air without oxygen. Pt updated on plan.   Apollonia Amini, Blanchard Kelch

## 2013-08-25 NOTE — Progress Notes (Addendum)
[facility] [place]  Subjective  - I'm feeling a lot better.  No nausea since Yesterday morning.     Objective 123/72 69 98.5 F (36.9 C) (Oral) 18 95%  Intake/Output Summary (Last 24 hours) at 08/25/13 0745 Last data filed at 08/24/13 1700  Gross per 24 hour  Intake    480 ml  Output      0 ml  Net    480 ml    5/5 strength all four extremities. Right neck incision clean and dry No facial droop, no tongue deviation.   Assessment/Planning: Procedure(s): RIGHT CAROTID ANEURYSM RESECTION  4 Days Post-OpSurgeon(s): Larina Earthly, MD O2 1/2 liter Nursing will wean and ambulate later this am.  IS is being utilized. Possible D/C home today.     Clinton Gallant [facility] 08/25/2013 7:45 AM --  Laboratory Lab Results: No results found for this basename: WBC, HGB, HCT, PLT,  in the last 72 hours BMET No results found for this basename: NA, K, CL, CO2, GLUCOSE, BUN, CREATININE, CALCIUM,  in the last 72 hours  COAG Lab Results  Component Value Date   INR 0.93 08/19/2013   INR 0.95 12/19/2010   INR 0.96 11/23/2010   No results found for this basename: PTT   Addendum  I have independently interviewed and examined the patient, and I agree with the physician assistant's findings.  Pt has severe COPD and prior pulm surg.  I suspect her baseline Sa02 is decreased.  Pt has no dyspnea with ambulation.  She can be d/c today.  Her neuro exam is completely unchanged.  Leonides Sake, MD [facility] [place] Office: [phone] Pager: [phone]  08/25/2013, 10:59 AM

## 2013-08-27 ENCOUNTER — Encounter: Payer: Medicare Other | Admitting: Family Medicine

## 2013-09-07 ENCOUNTER — Encounter: Payer: Self-pay | Admitting: Vascular Surgery

## 2013-09-08 ENCOUNTER — Ambulatory Visit (INDEPENDENT_AMBULATORY_CARE_PROVIDER_SITE_OTHER): Payer: Medicare Other | Admitting: Vascular Surgery

## 2013-09-08 ENCOUNTER — Encounter: Payer: Self-pay | Admitting: Vascular Surgery

## 2013-09-08 VITALS — BP 110/67 | HR 74 | Ht 66.0 in | Wt 136.0 lb

## 2013-09-08 DIAGNOSIS — Z48812 Encounter for surgical aftercare following surgery on the circulatory system: Secondary | ICD-10-CM

## 2013-09-08 DIAGNOSIS — I720 Aneurysm of carotid artery: Secondary | ICD-10-CM

## 2013-09-08 NOTE — Progress Notes (Signed)
The patient presents today for followup of her right carotid artery aneurysm resection on 08/21/2013. She did quite well was discharged home on postoperative day #1. She denies any neurologic deficits. Her peri-incisional numbness is resolving.  On physical exam her neck incision is well healed. She is grossly intact neurologically. She does have soft bilateral carotid bruits.  Impression and plan stable followup after carotid aneurysm resection. Will be seen again in 6 months with carotid duplex. Patient also has a known small abdominal aortic aneurysm and will have ultrasound followup as scheduled at the same setting. He will notify us should she develop any neurologic deficits or other difficulties

## 2013-09-08 NOTE — Addendum Note (Signed)
Addended by: Adria Dill L on: 09/08/2013 10:53 AM   Modules accepted: Orders

## 2013-09-17 ENCOUNTER — Encounter: Payer: Self-pay | Admitting: *Deleted

## 2013-09-21 ENCOUNTER — Telehealth: Payer: Self-pay

## 2013-09-21 NOTE — Telephone Encounter (Signed)
Pt is going to have a CT scan in January to follow up on lung cancer. She would like to know if she should have the hernia x-ray also. Please advise

## 2013-09-24 NOTE — Telephone Encounter (Signed)
PLEASE CALL PT. DR. Sabah Zucco DOES NOT NO ADDITIONAL XRAYS.

## 2013-09-29 NOTE — Telephone Encounter (Signed)
Called and informed pt.  

## 2013-10-15 ENCOUNTER — Other Ambulatory Visit (HOSPITAL_COMMUNITY): Payer: Self-pay | Admitting: Oncology

## 2013-10-15 DIAGNOSIS — C3490 Malignant neoplasm of unspecified part of unspecified bronchus or lung: Secondary | ICD-10-CM

## 2013-10-16 IMAGING — CR DG CHEST 2V
2 series · 2 of 2 positions shown · non-contrast
Comparison: CT chest dated 07/19/2011

CLINICAL DATA: Left chest pain, history of lung cancer

CHEST - 2 VIEW

[w chest pa]
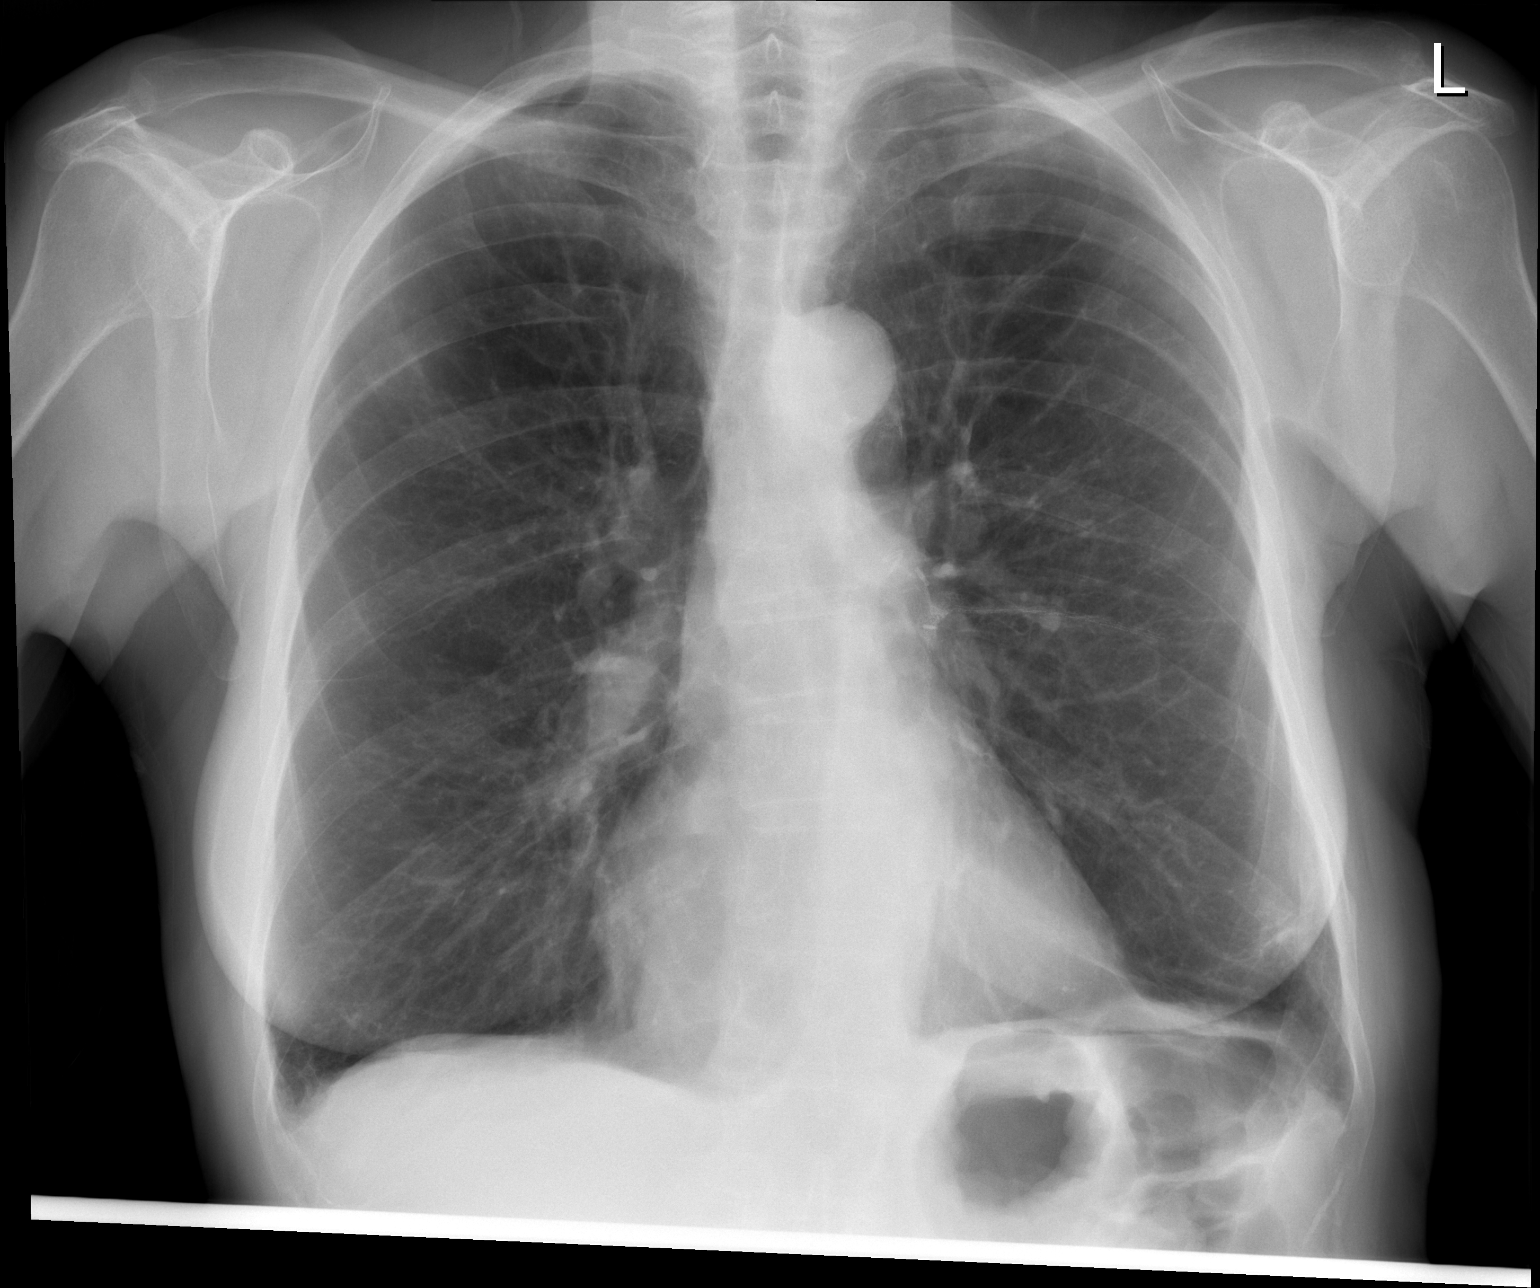

[w chest lat]
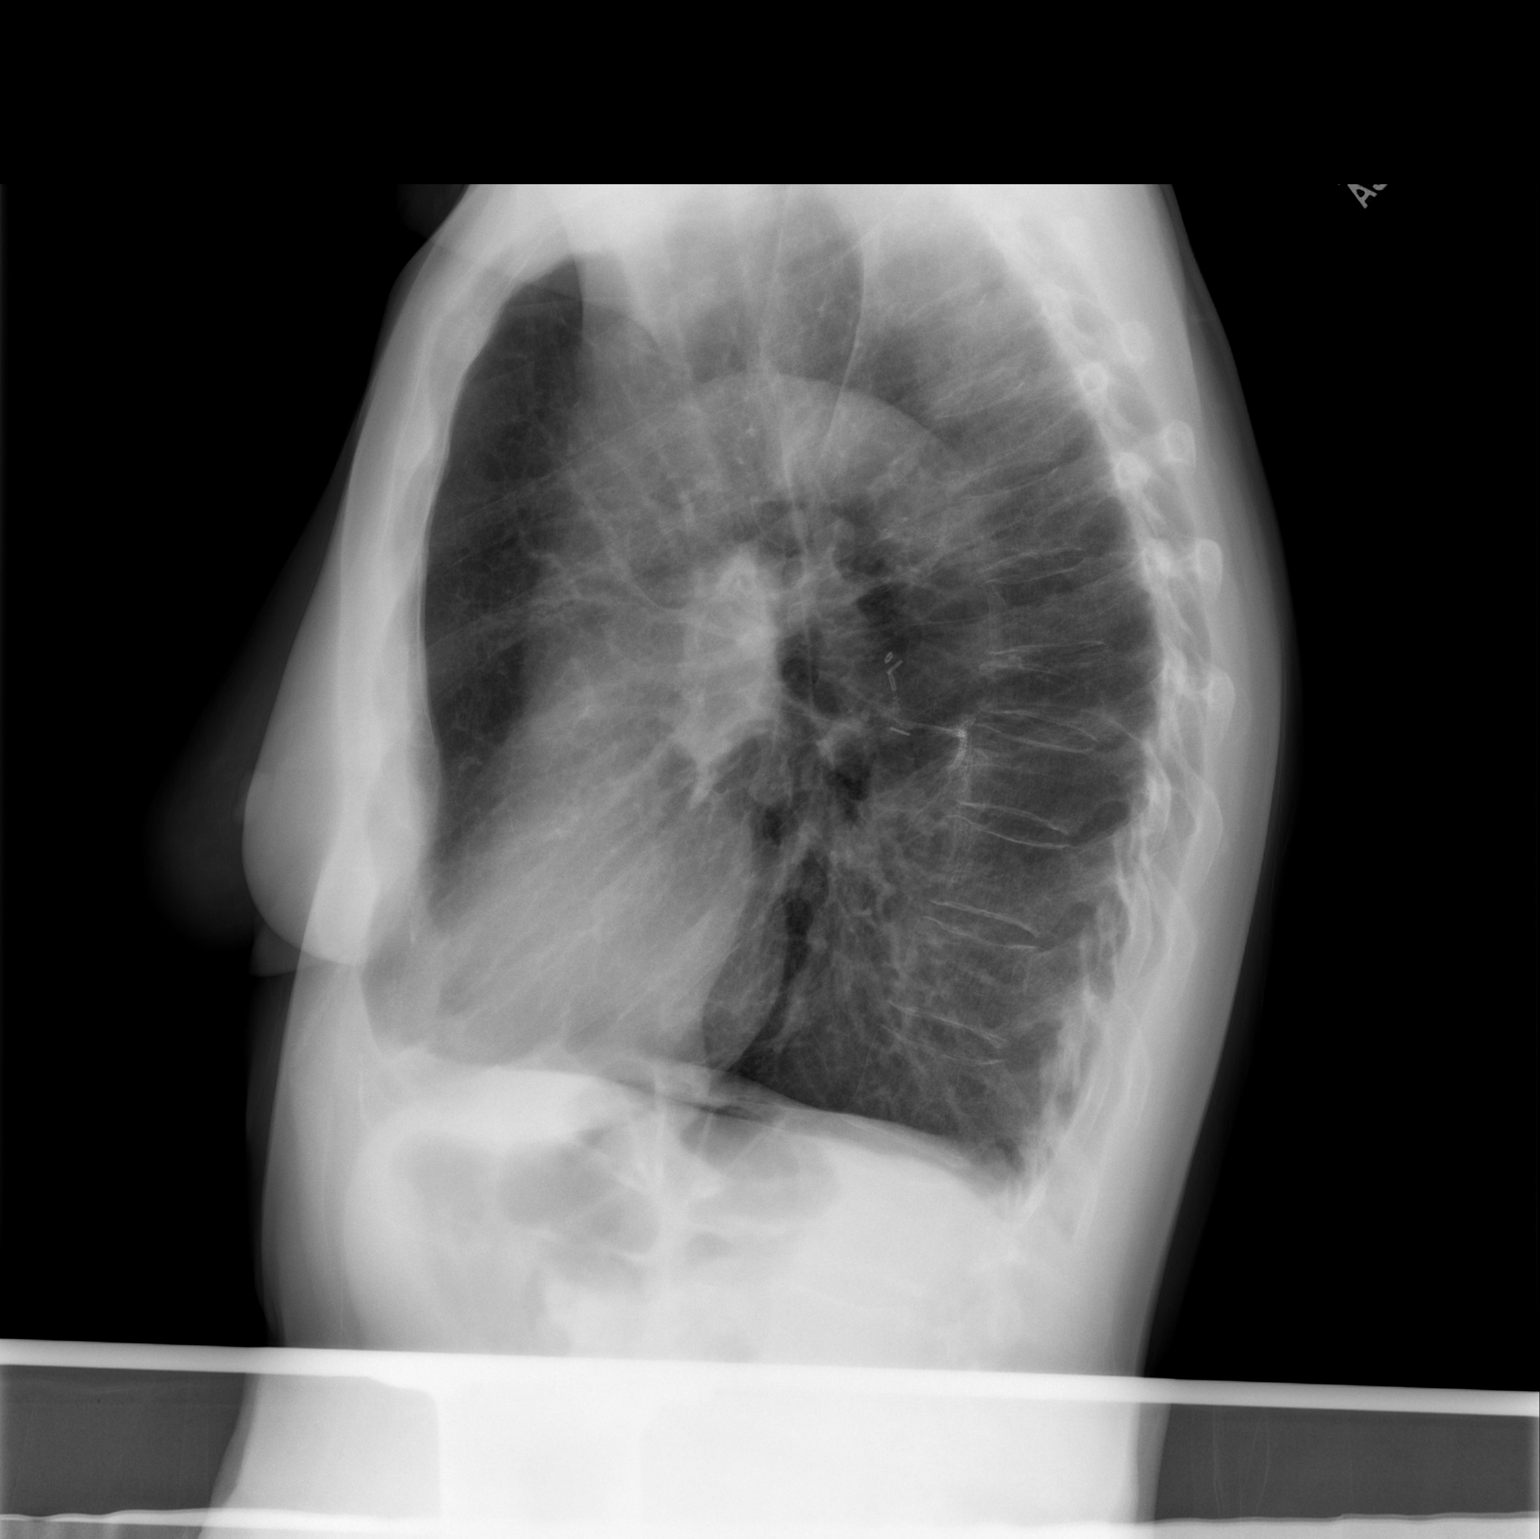

[2 of 2 positions shown; findings below may reference images not displayed]

FINDINGS: Postsurgical changes in the left mid lung.  Chronic
interstitial markings/emphysematous changes. No pleural effusion or
pneumothorax.

Cardiomediastinal silhouette is within normal limits.  Surgical
clips overlying the left hilum.

Moderate compression deformity at T8, unchanged.
IMPRESSION: Postsurgical changes in the left mid lung.

Chronic interstitial markings/emphysematous changes.

## 2013-10-26 ENCOUNTER — Other Ambulatory Visit (HOSPITAL_COMMUNITY): Payer: Self-pay | Admitting: Hematology and Oncology

## 2013-10-26 ENCOUNTER — Other Ambulatory Visit (HOSPITAL_COMMUNITY): Payer: Self-pay | Admitting: Oncology

## 2013-10-26 ENCOUNTER — Encounter (HOSPITAL_COMMUNITY): Payer: Medicare HMO | Attending: Hematology and Oncology

## 2013-10-26 ENCOUNTER — Ambulatory Visit (HOSPITAL_COMMUNITY)
Admission: RE | Admit: 2013-10-26 | Discharge: 2013-10-26 | Disposition: A | Discharge status: Home / Self Care | Payer: Medicare HMO | Source: Ambulatory Visit | Attending: Oncology | Admitting: Oncology

## 2013-10-26 DIAGNOSIS — Z09 Encounter for follow-up examination after completed treatment for conditions other than malignant neoplasm: Secondary | ICD-10-CM | POA: Insufficient documentation

## 2013-10-26 DIAGNOSIS — Z9889 Other specified postprocedural states: Secondary | ICD-10-CM | POA: Insufficient documentation

## 2013-10-26 DIAGNOSIS — C3490 Malignant neoplasm of unspecified part of unspecified bronchus or lung: Secondary | ICD-10-CM

## 2013-10-26 DIAGNOSIS — Z85118 Personal history of other malignant neoplasm of bronchus and lung: Secondary | ICD-10-CM | POA: Insufficient documentation

## 2013-10-26 DIAGNOSIS — C3430 Malignant neoplasm of lower lobe, unspecified bronchus or lung: Secondary | ICD-10-CM

## 2013-10-26 LAB — COMPREHENSIVE METABOLIC PANEL
ALT: 10 U/L (ref 0–35)
AST: 16 U/L (ref 0–37)
Albumin: 3.8 g/dL (ref 3.5–5.2)
Alkaline Phosphatase: 65 U/L (ref 39–117)
BUN: 11 mg/dL (ref 6–23)
CO2: 29 mEq/L (ref 19–32)
Calcium: 9.6 mg/dL (ref 8.4–10.5)
Chloride: 102 mEq/L (ref 96–112)
Creatinine, Ser: 0.75 mg/dL (ref 0.50–1.10)
GFR calc Af Amer: >90 mL/min (ref >90)
GFR calc non Af Amer: 83 mL/min — ABNORMAL LOW (ref >90)
Glucose, Bld: 97 mg/dL (ref 70–99)
Potassium: 4.6 mEq/L (ref 3.7–5.3)
Sodium: 140 mEq/L (ref 137–147)
Total Bilirubin: 0.4 mg/dL (ref 0.3–1.2)
Total Protein: 7.1 g/dL (ref 6.0–8.3)

## 2013-10-26 LAB — CBC WITH DIFFERENTIAL/PLATELET
Basophils Absolute: 0 10*3/uL (ref 0.0–0.1)
Basophils Relative: 1 % (ref 0–1)
Eosinophils Absolute: 0.2 10*3/uL (ref 0.0–0.7)
Eosinophils Relative: 4 % (ref 0–5)
HCT: 41.5 % (ref 36.0–46.0)
Hemoglobin: 14.6 g/dL (ref 12.0–15.0)
Lymphocytes Relative: 30 % (ref 12–46)
Lymphs Abs: 1.7 10*3/uL (ref 0.7–4.0)
MCH: 31.6 pg (ref 26.0–34.0)
MCHC: 35.2 g/dL (ref 30.0–36.0)
MCV: 89.8 fL (ref 78.0–100.0)
Monocytes Absolute: 0.4 10*3/uL (ref 0.1–1.0)
Monocytes Relative: 6 % (ref 3–12)
Neutro Abs: 3.4 10*3/uL (ref 1.7–7.7)
Neutrophils Relative %: 60 % (ref 43–77)
Platelets: 270 10*3/uL (ref 150–400)
RBC: 4.62 MIL/uL (ref 3.87–5.11)
RDW: 13.1 % (ref 11.5–15.5)
WBC: 5.7 10*3/uL (ref 4.0–10.5)

## 2013-10-26 NOTE — Progress Notes (Signed)
Labs drawn today for cbc/diff,cmp 

## 2013-10-27 NOTE — Progress Notes (Signed)
[name], MD [address] [place] [address]  Adenocarcinoma of lung - Plan: CBC with Differential, Comprehensive metabolic panel, CT Chest Wo Contrast  CURRENT THERAPY: Surveillance per NCCN guidelines  INTERVAL HISTORY: Mattingly S Belanger 73 y.o. female returns for  regular  visit for followup of Stage IB bronchoalveolar carcinoma,. Well-differentiated the left lower lobe status post resection on 12/21/2010.  She had 14 lymph nodes negative and no perineural space invasion with clear margins and no LV I. Thus far she has no evidence for recurrent disease or new disease.      Adenocarcinoma of lung   12/21/2010 Initial Diagnosis S/P left lower lobe resection for a well-differentiated Stage IB bronchoalveolar cancer. 0/14 lymph nodes.  No perineural invasion or LVI.  negative margins.    She is S/P a resection of right internal carotid aneurysm with primary end-to-end repair by Dr. Donnetta Hutching on 08/21/2013.  I personally reviewed and went over laboratory results with the patient.  Results follow below.   I personally reviewed and went over radiographic studies with the patient.  CT of chest is negative for any intrathoracic indications of recurrence or new malignancy.  The full report follows below.  NCCN guidelines for Non-Small Cell Lung Cancer Surveillance are as follows:  A. H+P every 6-12 months  B. CT chest every 6-12 months x 2 years and then annually   C. PET scan not typically indicated for surveillance  She asks if her Ct of chest evaluated her abdominal aortic aneurysm and she was educated that the CT of chest does not image that far down into the abdomen and it only catches the very most superior section of the abdomen.  As a result, I cannot update her on her AAA.  She explains that she sees Dr. Donnetta Hutching this coming summer for follow-up on her arterectomy previously performed and to follow-up on her AAA.  Oncologically, she denies any complaints and ROS  questioning is negative.   Past Medical History  Diagnosis Date  . COPD (chronic obstructive pulmonary disease)   . Anxiety disorder   . Hypothyroidism   . PUD (peptic ulcer disease)     40yrs  . Dysphagia   . IBS (irritable bowel syndrome)   . Diverticulosis   . Trigeminal neuralgia   . Diastolic dysfunction   . Depression   . Allergic rhinitis   . 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 Hiatal hernia   . Aortic aneurysm   . Complication of anesthesia 2011    bloodpressure dropped during colonoscopy, not problems since  . Shortness of breath     with exertion  . Aneurysm of carotid artery     corrected by surgery 08/21/13    has CONTRAST DYE ALLERGY; Epigastric pain; Esophageal dysphagia; Trigeminal neuralgia; Adenocarcinoma of lung; Dyspepsia; Aneurysm; Abdominal aneurysm without mention of rupture; and Aneurysm of neck on her problem list.     is allergic to ciprofloxacin hcl; iohexol; prozac; and sulfonamide derivatives.  Ms. Derryberry had no medications administered during this visit.  Past Surgical History  Procedure Laterality Date  . 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 Esophagogastroduodenoscopy  11/13/2009    w/dilation to 36mm, gastric ulceration (H.Pylori) s/p treatment  . Esophagogastroduodenoscopy  11/21/2009    distal esophageal web, gastritis  . Lung cancer surgery  12/2010    Left VATS, minithoracotomy, LLL superior segmentectomy  . Vocal  cord surgery  02/06/2011    laryngoscopy with bilateral vocal cord Radiesse injection for vocal cord paralysis  . Abdominal hysterectomy    . Cholecystectomy    . Tubal ligation    . Cataract extraction    . Fatty tumor removal from lt groin    . Cataract extraction w/phaco  09/02/2012    Procedure: CATARACT EXTRACTION PHACO AND INTRAOCULAR LENS PLACEMENT (IOC);  Surgeon: Elta Guadeloupe T. Gershon Crane, MD;  Location: [facility];  Service: Ophthalmology;  Laterality: Left;  CDE:10.35    . Endarterectomy Right 08/21/2013    Procedure: RIGHT CAROTID ANEURYSM RESECTION;  Surgeon: Rosetta Posner, MD;  Location: [facility] OR;  Service: Vascular;  Laterality: Right;    Denies any headaches, dizziness, double vision, fevers, chills, night sweats, nausea, vomiting, diarrhea, constipation, chest pain, heart palpitations, shortness of breath, blood in stool, black tarry stool, urinary pain, urinary burning, urinary frequency, hematuria.   PHYSICAL EXAMINATION  ECOG PERFORMANCE STATUS: 0 - Asymptomatic  Filed Vitals:   10/28/13 1125  BP: 118/79  Pulse: 67  Temp: 97.4 F (36.3 C)  Resp: 20    GENERAL:alert, healthy, no distress, well nourished, well developed, comfortable, cooperative and smiling SKIN: skin color, texture, turgor are normal, no rashes or significant lesions HEAD: Normocephalic, No masses, lesions, tenderness or abnormalities EYES: normal, PERRLA, EOMI, Conjunctiva are pink and non-injected EARS: External ears normal OROPHARYNX:mucous membranes are moist  NECK: supple, no adenopathy, thyroid normal size, non-tender, without nodularity, no stridor, non-tender, trachea midline, right surgical incision is well healed LYMPH:  no palpable lymphadenopathy BREAST:not examined LUNGS: clear to auscultation and percussion HEART: regular rate & rhythm, no murmurs, no gallops, S1 normal and S2 normal ABDOMEN:non-tender and normal bowel sounds BACK: Back symmetric, no curvature. EXTREMITIES:less then 2 second capillary refill, no joint deformities, effusion, or inflammation, no edema, no skin discoloration, no clubbing, no cyanosis  NEURO: alert & oriented x 3 with fluent speech, no focal motor/sensory deficits, gait normal    LABORATORY DATA: CBC    Component Value Date/Time   WBC 5.7 10/26/2013 0841   RBC 4.62 10/26/2013 0841   HGB 14.6 10/26/2013 0841   HCT 41.5 10/26/2013 0841   PLT 270 10/26/2013 0841   MCV 89.8 10/26/2013 0841   MCH 31.6 10/26/2013 0841   MCHC 35.2  10/26/2013 0841   RDW 13.1 10/26/2013 0841   LYMPHSABS 1.7 10/26/2013 0841   MONOABS 0.4 10/26/2013 0841   EOSABS 0.2 10/26/2013 0841   BASOSABS 0.0 10/26/2013 0841      Chemistry      Component Value Date/Time   NA 140 10/26/2013 0841   K 4.6 10/26/2013 0841   CL 102 10/26/2013 0841   CO2 29 10/26/2013 0841   BUN 11 10/26/2013 0841   CREATININE 0.75 10/26/2013 0841      Component Value Date/Time   CALCIUM 9.6 10/26/2013 0841   ALKPHOS 65 10/26/2013 0841   AST 16 10/26/2013 0841   ALT 10 10/26/2013 0841   BILITOT 0.4 10/26/2013 0841       RADIOGRAPHIC STUDIES:  10/26/2013  CLINICAL DATA: Status post partial left lower lobectomy for lung  cancer 2012. Restaging.  EXAM:  CT CHEST WITHOUT CONTRAST  TECHNIQUE:  Multidetector CT imaging of the chest was performed following the  standard protocol without IV contrast.  COMPARISON: Chest radiograph 08/19/2013, chest CT 10/31/2012,  07/19/2011 and 11/01/2010.  FINDINGS:  Emphysematous changes are reidentified with evidence of partial left  lower lobectomy. Scarring at the left lung base  image 35 is stable.  Subpleural curvilinear dependent atelectasis and or scarring is  noted at the left lower lobe, with apparent interval resolution of  the previously seen 8 mm nodular area of more focal presumed  scarring or atelectasis medially. Curvilinear right lower lobe  subpleural scarring/ atelectasis again noted. No new pulmonary  nodule, mass, or consolidation.  No new lytic or sclerotic osseous lesion. T8 compression deformity  reidentified. Mild atheromatous aortic calcification without  aneurysm. Small pericardial fluid is stable. Great vessels are  normal in caliber. No mediastinal or hilar lymphadenopathy allowing  for decreased conspicuity of lymph nodes due to lack of contrast.  IMPRESSION:  Resolution of nodular component of presumed left lower lobe  subpleural scarring/ atelectasis status post partial left lower  lobectomy. No new  CT evidence for recurrent malignancy or acute  intrathoracic abnormality.  Electronically Signed  By: Conchita Paris M.D.  On: 10/26/2013 12:03     ASSESSMENT:  1. Stage IB bronchoalveolar carcinoma,. Well-differentiated the left lower lobe status post resection on 12/21/2010.  She had 14 lymph nodes negative and no perineural space invasion with clear margins and no LV I. Thus far she has no evidence for recurrent disease or new disease.   Patient Active Problem List   Diagnosis Date Noted  . Aneurysm of neck 08/11/2013  . 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 Dyspepsia 02/11/2012  . Aneurysm 02/11/2012  . Trigeminal neuralgia   . Epigastric pain 02/21/2011  . Esophageal dysphagia 02/21/2011  . Adenocarcinoma of lung 12/07/2010  . CONTRAST DYE ALLERGY 12/30/2009    PLAN:  1. I personally reviewed and went over laboratory results with the patient. 2. I personally reviewed and went over radiographic studies with the patient. 3. Next screening mammogram is due in June 2015 4. Labs in 1 year: CBC diff, CMET 5. CT chest without contrast in 1 year 6. Return in 1 year for follow-up.   THERAPY PLAN:  We will follow NCCN guidelines for surveillance os NSCLC. NCCN guidelines for Non-Small Cell Lung Cancer Surveillance are as follows:  A. H+P every 6-12 months  B. CT chest every 6-12 months x 2 years and then annually   C. PET scan not typically indicated for surveillance   All questions were answered. The patient knows to call the clinic with any problems, questions or concerns. We can certainly see the patient much sooner if necessary.  Patient and plan discussed with Dr. Farrel Gobble and he is in agreement with the aforementioned.   Mariapaula Krist

## 2013-10-28 ENCOUNTER — Encounter (HOSPITAL_COMMUNITY): Payer: Self-pay | Admitting: Oncology

## 2013-10-28 ENCOUNTER — Encounter (HOSPITAL_BASED_OUTPATIENT_CLINIC_OR_DEPARTMENT_OTHER): Payer: Medicare HMO | Admitting: Oncology

## 2013-10-28 VITALS — BP 118/79 | HR 67 | Temp 97.4°F | Resp 20 | Wt 139.1 lb

## 2013-10-28 DIAGNOSIS — Z85118 Personal history of other malignant neoplasm of bronchus and lung: Secondary | ICD-10-CM

## 2013-10-28 DIAGNOSIS — C3490 Malignant neoplasm of unspecified part of unspecified bronchus or lung: Secondary | ICD-10-CM

## 2013-10-28 NOTE — Patient Instructions (Signed)
[facility] Discharge Instructions  RECOMMENDATIONS MADE BY THE CONSULTANT AND ANY TEST RESULTS WILL BE SENT TO YOUR REFERRING PHYSICIAN.  Return in one year for doctor's appointment and lab work. CT Scan 1 year.    Thank you for choosing [facility] to provide your oncology and hematology care.  To afford each patient quality time with our providers, please arrive at least 15 minutes before your scheduled appointment time.  With your help, our goal is to use those 15 minutes to complete the necessary work-up to ensure our physicians have the information they need to help with your evaluation and healthcare recommendations.    Effective January 1st, 2014, we ask that you re-schedule your appointment with our physicians should you arrive 10 or more minutes late for your appointment.  We strive to give you quality time with our providers, and arriving late affects you and other patients whose appointments are after yours.    Again, thank you for choosing [facility].  Our hope is that these requests will decrease the amount of time that you wait before being seen by our physicians.       _____________________________________________________________  Should you have questions after your visit to [facility], please contact our office at [phone] between the hours of 8:30 a.m. and 5:00 p.m.  Voicemails left after 4:30 p.m. will not be returned until the following business day.  For prescription refill requests, have your pharmacy contact our office with your prescription refill request.

## 2013-11-02 ENCOUNTER — Telehealth (HOSPITAL_COMMUNITY): Payer: Self-pay | Admitting: Oncology

## 2013-11-02 NOTE — Telephone Encounter (Signed)
MAILED 2014 CHARGES TO PT AT HER REQUEST

## 2013-11-26 IMAGING — CT CT ABDOMEN W/O CM
2 of 3 series · 16 of 46 positions shown, 18 images · non-contrast
Comparison: 07/19/2011 chest CT.  PET CT 11/13/2010.

CT CHEST

CLINICAL DATA: Lung cancer.  Staging.

CT CHEST AND ABDOMEN WITHOUT CONTRAST
TECHNIQUE: Multidetector CT imaging of the chest and abdomen was p
erformed following the standard
protocol without intravenous contrast.

[Series 2: cap w/o 5.0 b40f · axial · non-contrast · 0.69mm/px · z∈[+486,+876]mm · 13 of 90 slices shown, 15 images]
[im 6/90  soft-tissue]
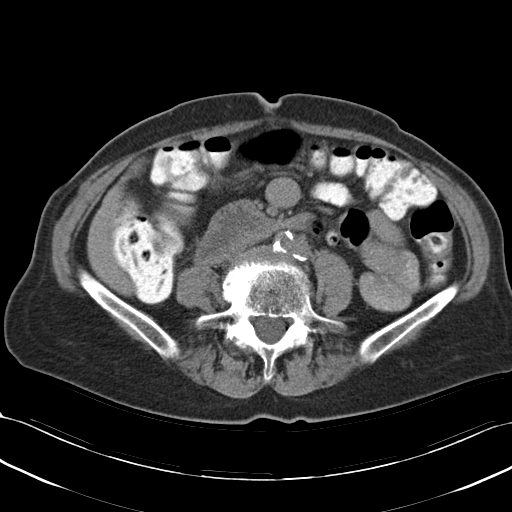
[im 6/90  bone]
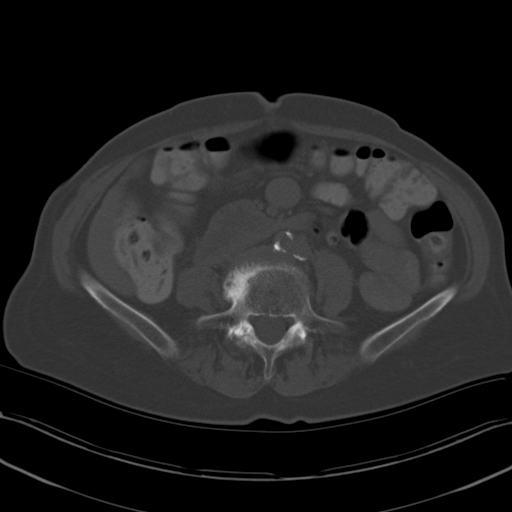
[im 12/90  soft-tissue]
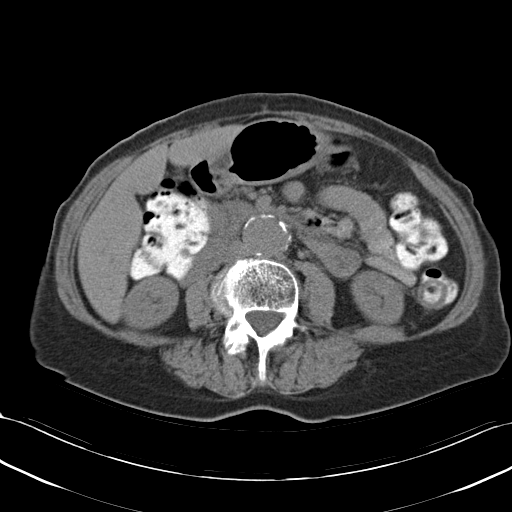
[im 18/90  soft-tissue]
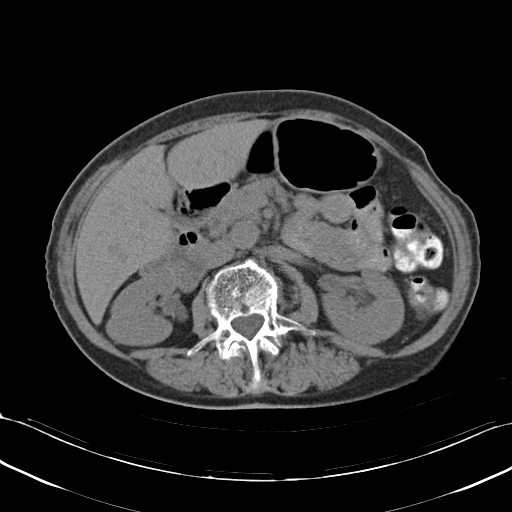
[im 26/90  soft-tissue]
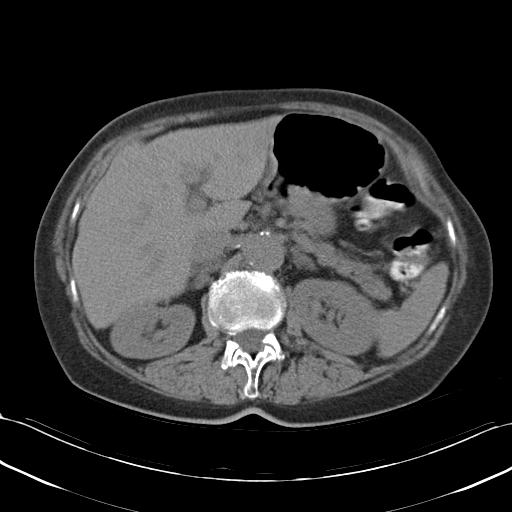
[im 32/90  soft-tissue]
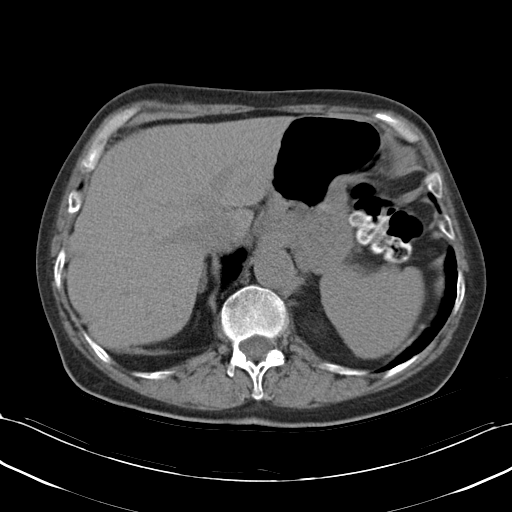
[im 38/90  soft-tissue]
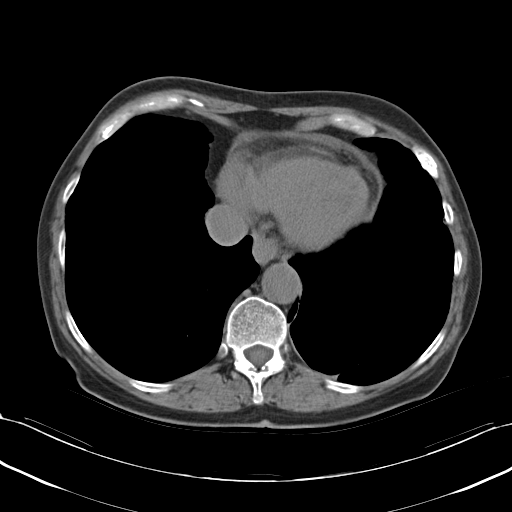
[im 46/90  soft-tissue]
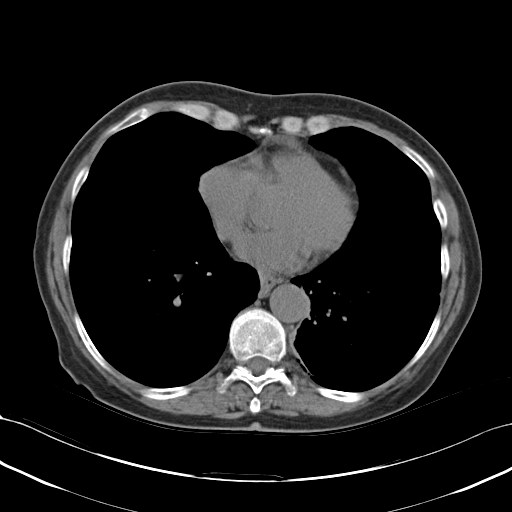
[im 52/90  soft-tissue]
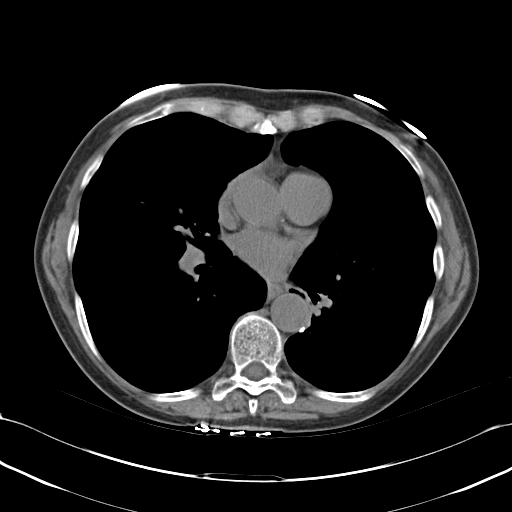
[im 58/90  soft-tissue]
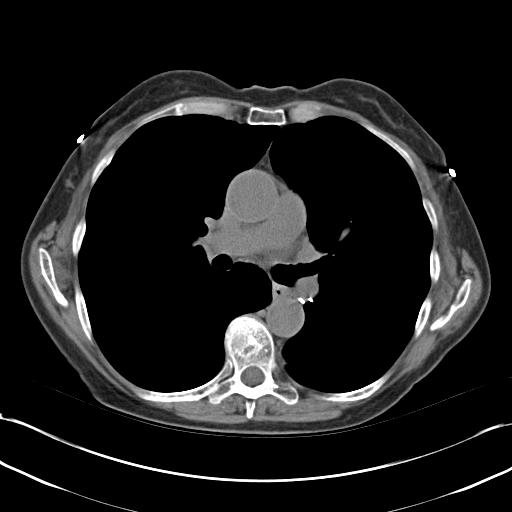
[im 58/90  bone]
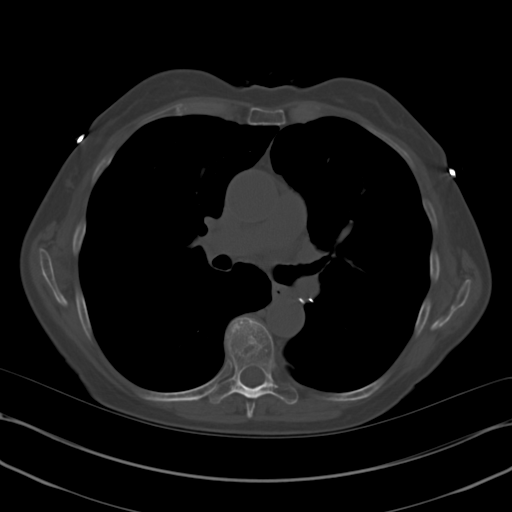
[im 64/90  soft-tissue]
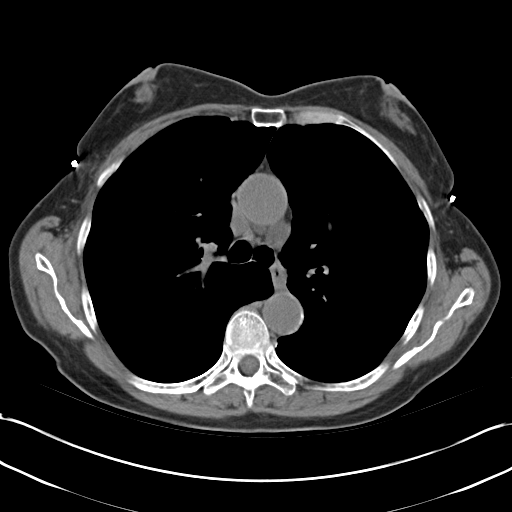
[im 72/90  soft-tissue]
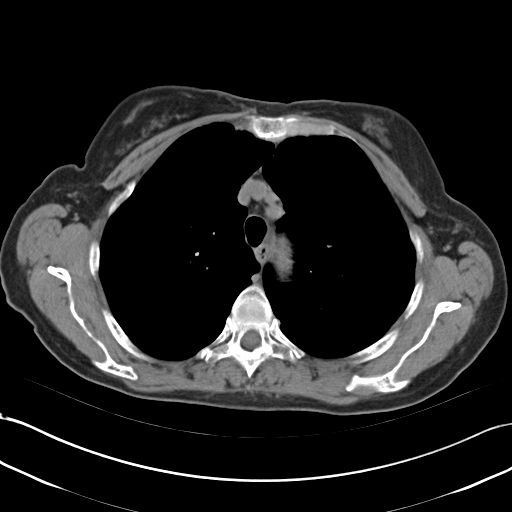
[im 78/90  soft-tissue]
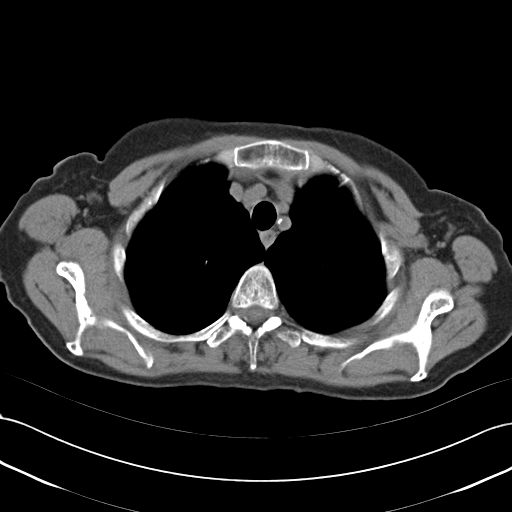
[im 84/90  soft-tissue]
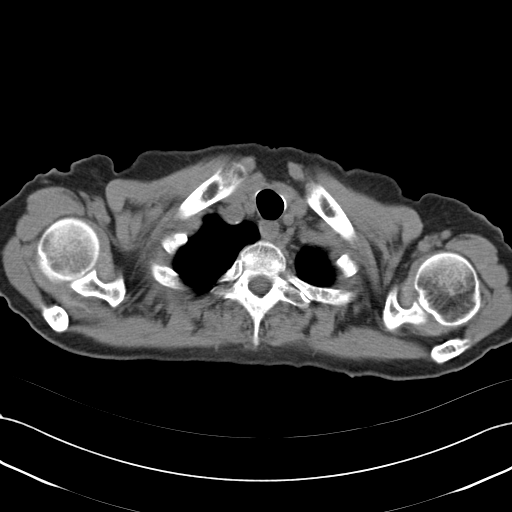

[Series 4: mpr coro cap (id) · coronal · 0.67mm/px · 3 of 78 slices shown]
[im 26/78  soft-tissue]
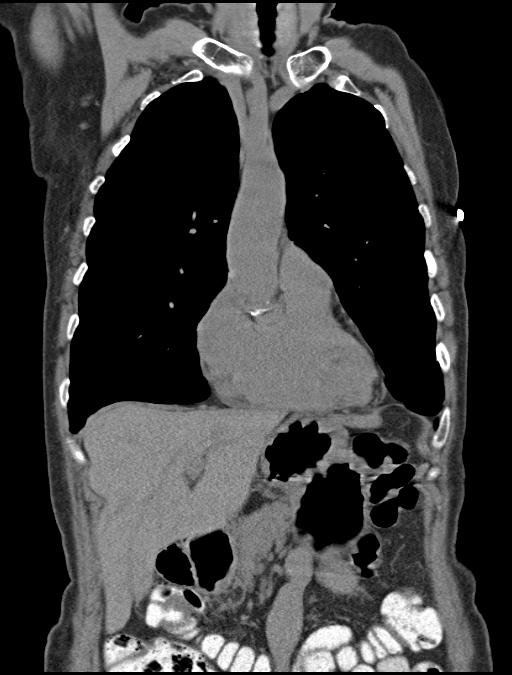
[im 35/78  soft-tissue]
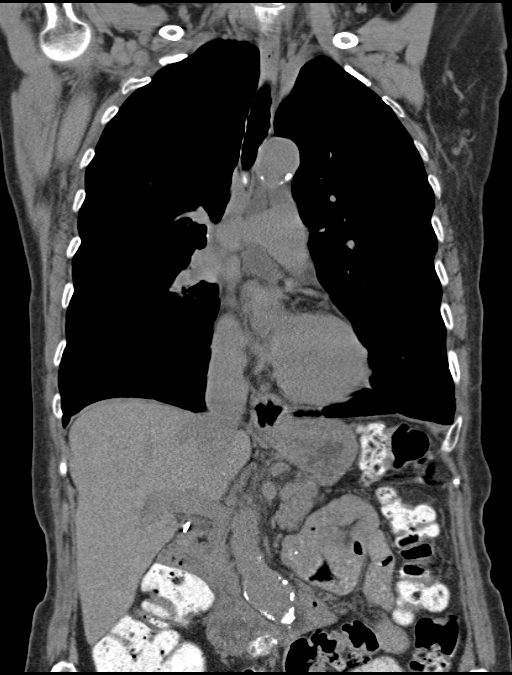
[im 43/78  soft-tissue]
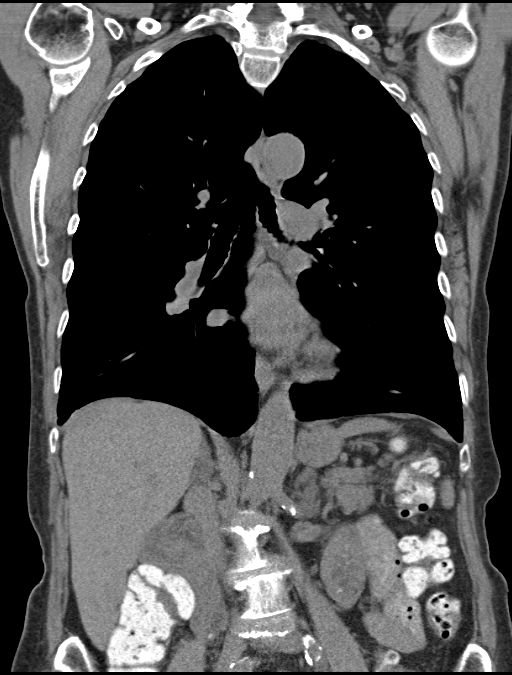

[16 of 46 positions shown; findings below may reference images not displayed]

FINDINGS: Changes of prior wedge resection of the left lower lobe
nodule.  Areas of scarring in the left lower lobe are stable when
compared to prior study.  No suspicious pulmonary nodule to suggest
recurrent or residual disease.  COPD changes.  Right lung is clear.

Heart is normal size. Aorta is normal caliber.  Stable mild
pericardial fluid or pericardial thickening.  Small hiatal hernia.

No mediastinal, hilar, or axillary adenopathy.  Visualized thyroid
and chest wall soft tissues unremarkable.

No acute bony abnormality.  Stable mild compression fracture the
mid thoracic spine.
IMPRESSION: Postoperative changes in the left lower lobe.  No change.  No
evidence of recurrent or metastatic disease.

COPD.

CT ABDOMEN
FINDINGS: Prior cholecystectomy.  The unenhanced appearance of the
liver, spleen, pancreas, adrenals and left kidney are unremarkable.
Sensitivity for detecting small or subtle lesions is decreased
without IV contrast.  Small nonobstructing stone in the mid pole of
the right kidney.  No hydronephrosis.

Mild infrarenal abdominal aortic aneurysm, measuring up to 3.4 cm.
Right common iliac artery aneurysm measures 2.3 cm.

Descending colonic diverticulosis.  No active diverticulitis.
Small bowel is decompressed.  No free fluid, free air or
adenopathy.  No acute bony abnormality.  Degenerative changes in
the lumbar spine.
IMPRESSION: No evidence of metastatic disease on this unenhanced study.

Colonic diverticulosis.

Abdominal aortic and right common iliac artery aneurysms.

Right nephrolithiasis.

## 2013-11-30 ENCOUNTER — Encounter: Payer: Self-pay | Admitting: Family Medicine

## 2013-12-01 ENCOUNTER — Encounter: Payer: Self-pay | Admitting: Family Medicine

## 2013-12-02 ENCOUNTER — Other Ambulatory Visit: Payer: Self-pay | Admitting: Family Medicine

## 2013-12-02 ENCOUNTER — Encounter: Payer: Self-pay | Admitting: Family Medicine

## 2013-12-02 MED ORDER — LEVOTHYROXINE SODIUM 25 MCG PO TABS: 25.0000 ug | ORAL_TABLET | Freq: Every day | ORAL | Status: DC

## 2013-12-02 MED ORDER — ALENDRONATE SODIUM 70 MG PO TABS
70.0000 mg | ORAL_TABLET | ORAL | Status: DC
Start: 2013-12-02 — End: 2015-03-22

## 2013-12-02 MED ORDER — CITALOPRAM HYDROBROMIDE 40 MG PO TABS: 40.0000 mg | ORAL_TABLET | Freq: Every day | ORAL | Status: DC

## 2013-12-12 ENCOUNTER — Encounter (HOSPITAL_COMMUNITY): Payer: Self-pay | Admitting: Emergency Medicine

## 2013-12-12 ENCOUNTER — Emergency Department (HOSPITAL_COMMUNITY): Payer: Medicare HMO

## 2013-12-12 ENCOUNTER — Emergency Department (HOSPITAL_COMMUNITY)
Admission: EM | Admit: 2013-12-12 | Discharge: 2013-12-12 | Disposition: A | Discharge status: Home / Self Care | Payer: Medicare HMO | Attending: Emergency Medicine | Admitting: Emergency Medicine

## 2013-12-12 DIAGNOSIS — IMO0002 Reserved for concepts with insufficient information to code with codable children: Secondary | ICD-10-CM

## 2013-12-12 DIAGNOSIS — J4489 Other specified chronic obstructive pulmonary disease: Secondary | ICD-10-CM | POA: Insufficient documentation

## 2013-12-12 DIAGNOSIS — S32009A Unspecified fracture of unspecified lumbar vertebra, initial encounter for closed fracture: Secondary | ICD-10-CM | POA: Insufficient documentation

## 2013-12-12 DIAGNOSIS — Y93E9 Activity, other interior property and clothing maintenance: Secondary | ICD-10-CM | POA: Insufficient documentation

## 2013-12-12 DIAGNOSIS — J449 Chronic obstructive pulmonary disease, unspecified: Secondary | ICD-10-CM | POA: Insufficient documentation

## 2013-12-12 DIAGNOSIS — Z87891 Personal history of nicotine dependence: Secondary | ICD-10-CM | POA: Insufficient documentation

## 2013-12-12 DIAGNOSIS — R296 Repeated falls: Secondary | ICD-10-CM | POA: Insufficient documentation

## 2013-12-12 DIAGNOSIS — Z79899 Other long term (current) drug therapy: Secondary | ICD-10-CM | POA: Insufficient documentation

## 2013-12-12 DIAGNOSIS — Y929 Unspecified place or not applicable: Secondary | ICD-10-CM | POA: Insufficient documentation

## 2013-12-12 DIAGNOSIS — E039 Hypothyroidism, unspecified: Secondary | ICD-10-CM | POA: Insufficient documentation

## 2013-12-12 DIAGNOSIS — F329 Major depressive disorder, single episode, unspecified: Secondary | ICD-10-CM | POA: Insufficient documentation

## 2013-12-12 DIAGNOSIS — Z8719 Personal history of other diseases of the digestive system: Secondary | ICD-10-CM | POA: Insufficient documentation

## 2013-12-12 DIAGNOSIS — Z7983 Long term (current) use of bisphosphonates: Secondary | ICD-10-CM | POA: Insufficient documentation

## 2013-12-12 DIAGNOSIS — Z8711 Personal history of peptic ulcer disease: Secondary | ICD-10-CM | POA: Insufficient documentation

## 2013-12-12 DIAGNOSIS — F3289 Other specified depressive episodes: Secondary | ICD-10-CM | POA: Insufficient documentation

## 2013-12-12 MED ORDER — ACETAMINOPHEN 325 MG PO TABS
650.0000 mg | ORAL_TABLET | Freq: Once | ORAL | Status: AC
  Administered 2013-12-12: 650 mg via ORAL
  Filled 2013-12-12: qty 2

## 2013-12-12 MED ORDER — HYDROCODONE-ACETAMINOPHEN 5-325 MG PO TABS: 1.0000 | ORAL_TABLET | Freq: Four times a day (QID) | ORAL | Status: DC | PRN

## 2013-12-12 MED ORDER — OXYCODONE-ACETAMINOPHEN 5-325 MG PO TABS
1.0000 | ORAL_TABLET | Freq: Once | ORAL | Status: DC
  Filled 2013-12-12: qty 1

## 2013-12-12 NOTE — ED Provider Notes (Signed)
CSN: [id number]     Arrival date & time 12/12/13  1022 History  This chart was scribed for Merryl Hacker, MD by Roxan Diesel, ED scribe.  This patient was seen in room [facility]17/[facility]17 and the patient's care was started at 10:53 AM.   Chief Complaint  Patient presents with  . Back Pain    The history is provided by the patient. No language interpreter was used.    HPI Comments: [name] is a 73 y.o. female who presents to the Emergency Department complaining of back pain that began this morning.  Pt reports she was trying to move a love seat and fell onto her buttocks and suddenly developed constant moderate-to-severe pain to her lower back. Pain is 6-7/10 at rest but becomes 10/10 at its worst.  It is worsened by flattening her legs and certain movements.  Pain does not radiate into legs.  She has been able to walk.  She denies weakness or numbness in legs or bowel or bladder dysfunction.  She denies previous h/o back injuries.    She denies hitting her head or any other injury.    Past Medical History  Diagnosis Date  . COPD (chronic obstructive pulmonary disease)   . Anxiety disorder   . Hypothyroidism   . PUD (peptic ulcer disease)     21yrs  . Dysphagia   . IBS (irritable bowel syndrome)   . Diverticulosis   . Trigeminal neuralgia   . Diastolic dysfunction   . Depression   . Allergic rhinitis   . 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 Hiatal hernia   . Aortic aneurysm   . Complication of anesthesia 2011    bloodpressure dropped during colonoscopy, not problems since  . Shortness of breath     with exertion  . Aneurysm of carotid artery     corrected by surgery 08/21/13    Past Surgical History  Procedure Laterality Date  . 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 Esophagogastroduodenoscopy  11/13/2009    w/dilation to 35mm, gastric ulceration (H.Pylori) s/p treatment  . Esophagogastroduodenoscopy   11/21/2009    distal esophageal web, gastritis  . Lung cancer surgery  12/2010    Left VATS, minithoracotomy, LLL superior segmentectomy  . Vocal cord surgery  02/06/2011    laryngoscopy with bilateral vocal cord Radiesse injection for vocal cord paralysis  . Abdominal hysterectomy    . Cholecystectomy    . Tubal ligation    . Cataract extraction    . Fatty tumor removal from lt groin    . Cataract extraction w/phaco  09/02/2012    Procedure: CATARACT EXTRACTION PHACO AND INTRAOCULAR LENS PLACEMENT (IOC);  Surgeon: Elta Guadeloupe T. Gershon Crane, MD;  Location: [facility];  Service: Ophthalmology;  Laterality: Left;  CDE:10.35  . Endarterectomy Right 08/21/2013    Procedure: RIGHT CAROTID ANEURYSM RESECTION;  Surgeon: Rosetta Posner, MD;  Location: [facility] OR;  Service: Vascular;  Laterality: Right;    Family History  Problem Relation Age of Onset  . Liver cancer Sister   . Cancer Sister   . Pulmonary embolism Mother   . Heart attack Father   . Hypertension Father   . Cancer Brother   . Cancer Daughter     History  Substance Use Topics  . Smoking status: Former Smoker -- 0.50 packs/day for 20 years    Types: Cigarettes    Quit date: 12/21/2010  .  Smokeless tobacco: Never Used     Comment: smoking cessation info given and reviewed   . Alcohol Use: Yes     Comment: seldom    OB History   Grav Para Term Preterm Abortions TAB SAB Ect Mult Living                   Review of Systems  Respiratory: Negative for chest tightness and shortness of breath.   Cardiovascular: Negative for chest pain.  Gastrointestinal: Negative for abdominal pain.  Genitourinary: Negative for dysuria and decreased urine volume.  Musculoskeletal: Positive for back pain.  Skin: Negative for wound.  Neurological: Negative for headaches.  Psychiatric/Behavioral: Negative for confusion.  All other systems reviewed and are negative.      Allergies  Ciprofloxacin hcl; Iohexol; Prozac; and Sulfonamide derivatives  Home  Medications   Current Outpatient Rx  Name  Route  Sig  Dispense  Refill  . alendronate (FOSAMAX) 70 MG tablet   Oral   Take 1 tablet (70 mg total) by mouth once a week. Take with a full glass of water on an empty stomach. Take on sundays   12 tablet   4   . calcium citrate-vitamin D (CITRACAL+D) 315-200 MG-UNIT per tablet   Oral   Take 1 tablet by mouth daily.          . Cholecalciferol (VITAMIN D-3) 5000 UNITS TABS   Oral   Take 5,000 Units by mouth daily.          . citalopram (CELEXA) 40 MG tablet   Oral   Take 1 tablet (40 mg total) by mouth at bedtime.   90 tablet   4   . diphenhydrAMINE (BENADRYL) 25 mg capsule   Oral   Take 25 mg by mouth at bedtime as needed for sleep.         . fish oil-omega-3 fatty acids 1000 MG capsule   Oral   Take 1 g by mouth daily.          Marland Kitchen ibuprofen (ADVIL,MOTRIN) 200 MG tablet   Oral   Take 400 mg by mouth every 6 (six) hours as needed (for pain).         Marland Kitchen levothyroxine (SYNTHROID, LEVOTHROID) 25 MCG tablet   Oral   Take 1 tablet (25 mcg total) by mouth daily before breakfast.   90 tablet   3   . Multiple Vitamin (MULTIVITAMIN WITH MINERALS) TABS   Oral   Take 1 tablet by mouth daily.         Marland Kitchen senna-docusate (SENOKOT-S) 8.6-50 MG per tablet   Oral   Take 1 tablet by mouth at bedtime as needed for mild constipation.         Marland Kitchen HYDROcodone-acetaminophen (NORCO/VICODIN) 5-325 MG per tablet   Oral   Take 1-2 tablets by mouth every 6 (six) hours as needed for moderate pain.   30 tablet   0    BP 116/62  Pulse 91  Temp(Src) 98.2 F (36.8 C) (Oral)  Resp 20  Ht 5\' 6"  (1.676 m)  Wt 139 lb (63.05 kg)  BMI 22.45 kg/m2  SpO2 91%  Physical Exam  Nursing note and vitals reviewed. Constitutional: She is oriented to person, place, and time. She appears well-developed and well-nourished. No distress.  Elderly  HENT:  Head: Normocephalic and atraumatic.  Neck: Normal range of motion.  No midline C-spine  tenderness  Cardiovascular: Normal rate, regular rhythm and normal heart sounds.   Pulmonary/Chest:  Effort normal and breath sounds normal. No respiratory distress. She has no wheezes.  Abdominal: Soft. Bowel sounds are normal. There is no tenderness. There is no rebound.  Musculoskeletal:  There is midline upper lumbar tenderness without step off or deformity,   Neurological: She is alert and oriented to person, place, and time.  5 out of 5 strength in hip flexion and knee extension, normal deep tendon reflexes bilateral lower extremities  Skin: Skin is warm and dry.  Psychiatric: She has a normal mood and affect.    ED Course  Procedures (including critical care time)    COORDINATION OF CARE: 10:57 AM-Discussed treatment plan which includes x-ray with pt at bedside and pt agreed to plan.     Labs Review Labs Reviewed - No data to display  Imaging Review Dg Lumbar Spine Complete  12/12/2013   CLINICAL DATA:  Back pain and flank pain  EXAM: LUMBAR SPINE - COMPLETE 4+ VIEW  COMPARISON:  None  FINDINGS: There is a scoliosis deformity involving the lumbar spine. Multi level disc space narrowing and ventral endplate spurring is identified compatible with degenerative disc disease. T12 compression deformity appears new from the previous examination. No retropulsion of fracture fragments identified. There is calcified atherosclerotic disease involving the abdominal aorta. Aneurysmal dilatation of the aorta measures up to 4.1 cm.  IMPRESSION: 1. Scoliosis and multilevel degenerative disc disease. 2. T12 compression deformity is age indeterminate but is new from 08/19/2013 3. Atherosclerotic disease with abdominal aortic aneurysm. Recommend follow up by [place] in 1year. This recommendation follows ACR consensus guidelines: White Paper of the ACR Incidental Findings Committee II on Vascular Findings. Joellyn Rued [id number]; 24:[id number].   Electronically Signed   By: Kerby Moors M.D.   On: 12/12/2013  11:56   Dg Pelvis 1-2 Views  12/12/2013   CLINICAL DATA:  Hip pain after fall.  EXAM: PELVIS - 1-2 VIEW  COMPARISON:  11/13/2010  FINDINGS: Pelvic bony ring is intact. No gross abnormality to the SI joints. Disc space changes at L4-L5. Sclerosis at the right hip joint and unclear if this is related to the acetabulum or femoral head. No evidence for a gross fracture or dislocation. Mild irregularity along the superior aspect of the right femoral head and neck appears to be degenerative in etiology.  IMPRESSION: No acute bone abnormality to the pelvis.  Nonspecific sclerosis in the right hip. This may be better characterized with a dedicated right hip examination.   Electronically Signed   By: Markus Daft M.D.   On: 12/12/2013 12:00     EKG Interpretation None      MDM   Final diagnoses:  Compression fracture    Patient presents with back pain following an injury. She is nontoxic and nonfocal on exam. She has been ambulatory. Exam notable for tenderness at the upper L/ lower T-spine without step off or deformity. Patient was given pain medications. X-rays suggestive of subacute versus acute compression fracture. Given patient's tenderness, suspect this occurred today.  Discussed diagnosis with patient. Patient will be given pain medications. I also wrote her a prescription for a corset brace for comfort.  She is to followup with her primary care physician. She was also referred to neurosurgery if she has continued pain.  After history, exam, and medical workup I feel the patient has been appropriately medically screened and is safe for discharge home. Pertinent diagnoses were discussed with the patient. Patient was given return precautions.   I personally performed the services described  in this documentation, which was scribed in my presence. The recorded information has been reviewed and is accurate.    Merryl Hacker, MD 12/13/13 [phone]

## 2013-12-12 NOTE — ED Notes (Signed)
Pt reports was moving a love seat this morning and fell onto concrete floor.  C/O pain to lower back.

## 2013-12-12 NOTE — ED Notes (Signed)
Pt alert & oriented x4, stable gait. Patient given discharge instructions, paperwork & prescription(s). Patient  instructed to stop at the registration desk to finish any additional paperwork. Patient verbalized understanding. Pt left department w/ no further questions. 

## 2013-12-12 NOTE — Discharge Instructions (Signed)
Back, Compression Fracture °A compression fracture happens when a force is put upon the length of your spine. Slipping and falling on your bottom are examples of such a force. When this happens, sometimes the force is great enough to compress the building blocks (vertebral bodies) of your spine. Although this causes a lot of pain, this can usually be treated at home, unless your caregiver feels hospitalization is needed for pain control. °Your backbone (spinal column) is made up of 24 main vertebral bodies in addition to the sacrum and coccyx (see illustration). These are held together by tough fibrous tissues (ligaments) and by support of your muscles. Nerve roots pass through the openings between the vertebrae. A sudden wrenching move, injury, or a fall may cause a compression fracture of one of the vertebral bodies. This may result in back pain or spread of pain into the belly (abdomen), the buttocks, and down the leg into the foot. Pain may also be created by muscle spasm alone. °Large studies have been undertaken to determine the best possible course of action to help your back following injury and also to prevent future problems. The recommendations are as follows. °FOLLOWING A COMPRESSION FRACTURE: °Do the following only if advised by your caregiver.  °· If a back brace has been suggested or provided, wear it as directed. °· DO NOT stop wearing the back brace unless instructed by your caregiver. °· When allowed to return to regular activities, avoid a sedentary life style. Actively exercise. Sporadic weekend binges of tennis, racquetball, water skiing, may actually aggravate or create problems, especially if you are not in condition for that activity. °· Avoid sports requiring sudden body movements until you are in condition for them. Swimming and walking are safer activities. °· Maintain good posture. °· Avoid obesity. °· If not already done, you should have a DEXA scan. Based on the results, be treated for  osteoporosis. °FOLLOWING ACUTE (SUDDEN) INJURY: °· Only take over-the-counter or prescription medicines for pain, discomfort, or fever as directed by your caregiver. °· Use bed rest for only the most extreme acute episode. Prolonged bed rest may aggravate your condition. Ice used for acute conditions is effective. Use a large plastic bag filled with ice. Wrap it in a towel. This also provides excellent pain relief. This may be continuous. Or use it for 30 minutes every 2 hours during acute phase, then as needed. Heat for 30 minutes prior to activities is helpful. °· As soon as the acute phase (the time when your back is too painful for you to do normal activities) is over, it is important to resume normal activities and work hardening programs. Back injuries can cause potentially marked changes in lifestyle. So it is important to attack these problems aggressively. °· See your caregiver for continued problems. He or she can help or refer you for appropriate exercises, physical therapy and work hardening if needed. °· If you are given narcotic medications for your condition, for the next 24 hours DO NOT: °· Drive °· Operate machinery or power tools. °· Sign legal documents. °· DO NOT drink alcohol, take sleeping pills or other medications that may interfere with treatment. °If your caregiver has given you a follow-up appointment, it is very important to keep that appointment. Not keeping the appointment could result in a chronic or permanent injury, pain, and disability. If there is any problem keeping the appointment, you must call back to this facility for assistance.  °SEEK IMMEDIATE MEDICAL CARE IF: °· You develop numbness,   tingling, weakness, or problems with the use of your arms or legs. °· You develop severe back pain not relieved with medications. °· You have changes in bowel or bladder control. °· You have increasing pain in any areas of the body. °Document Released: 09/24/2005 Document Revised: 12/17/2011  Document Reviewed: 04/28/2008 °[employer]® Patient Information ©2014 [employer]. ° °

## 2013-12-13 ENCOUNTER — Emergency Department (HOSPITAL_COMMUNITY): Payer: Medicare HMO

## 2013-12-13 ENCOUNTER — Encounter (HOSPITAL_COMMUNITY): Payer: Self-pay | Admitting: Emergency Medicine

## 2013-12-13 ENCOUNTER — Observation Stay (HOSPITAL_COMMUNITY)
Admission: EM | Admit: 2013-12-13 | Discharge: 2013-12-15 | Disposition: A | Discharge status: Home / Self Care | Payer: Medicare HMO | Attending: Family Medicine | Admitting: Family Medicine

## 2013-12-13 DIAGNOSIS — S22000A Wedge compression fracture of unspecified thoracic vertebra, initial encounter for closed fracture: Secondary | ICD-10-CM

## 2013-12-13 DIAGNOSIS — Z91041 Radiographic dye allergy status: Secondary | ICD-10-CM

## 2013-12-13 DIAGNOSIS — I729 Aneurysm of unspecified site: Secondary | ICD-10-CM

## 2013-12-13 DIAGNOSIS — E039 Hypothyroidism, unspecified: Secondary | ICD-10-CM | POA: Insufficient documentation

## 2013-12-13 DIAGNOSIS — I5189 Other ill-defined heart diseases: Secondary | ICD-10-CM | POA: Diagnosis present

## 2013-12-13 DIAGNOSIS — G500 Trigeminal neuralgia: Secondary | ICD-10-CM | POA: Insufficient documentation

## 2013-12-13 DIAGNOSIS — R1013 Epigastric pain: Secondary | ICD-10-CM

## 2013-12-13 DIAGNOSIS — Z87891 Personal history of nicotine dependence: Secondary | ICD-10-CM | POA: Insufficient documentation

## 2013-12-13 DIAGNOSIS — R55 Syncope and collapse: Secondary | ICD-10-CM

## 2013-12-13 DIAGNOSIS — J9601 Acute respiratory failure with hypoxia: Secondary | ICD-10-CM

## 2013-12-13 DIAGNOSIS — C3490 Malignant neoplasm of unspecified part of unspecified bronchus or lung: Secondary | ICD-10-CM

## 2013-12-13 DIAGNOSIS — R1319 Other dysphagia: Secondary | ICD-10-CM

## 2013-12-13 DIAGNOSIS — K5900 Constipation, unspecified: Secondary | ICD-10-CM | POA: Insufficient documentation

## 2013-12-13 DIAGNOSIS — J4489 Other specified chronic obstructive pulmonary disease: Secondary | ICD-10-CM | POA: Insufficient documentation

## 2013-12-13 DIAGNOSIS — Z8711 Personal history of peptic ulcer disease: Secondary | ICD-10-CM | POA: Insufficient documentation

## 2013-12-13 DIAGNOSIS — F411 Generalized anxiety disorder: Secondary | ICD-10-CM | POA: Insufficient documentation

## 2013-12-13 DIAGNOSIS — J309 Allergic rhinitis, unspecified: Secondary | ICD-10-CM | POA: Insufficient documentation

## 2013-12-13 DIAGNOSIS — K449 Diaphragmatic hernia without obstruction or gangrene: Secondary | ICD-10-CM | POA: Insufficient documentation

## 2013-12-13 DIAGNOSIS — F3289 Other specified depressive episodes: Secondary | ICD-10-CM | POA: Insufficient documentation

## 2013-12-13 DIAGNOSIS — T148XXA Other injury of unspecified body region, initial encounter: Secondary | ICD-10-CM

## 2013-12-13 DIAGNOSIS — Z9849 Cataract extraction status, unspecified eye: Secondary | ICD-10-CM | POA: Insufficient documentation

## 2013-12-13 DIAGNOSIS — I5030 Unspecified diastolic (congestive) heart failure: Secondary | ICD-10-CM | POA: Insufficient documentation

## 2013-12-13 DIAGNOSIS — F329 Major depressive disorder, single episode, unspecified: Secondary | ICD-10-CM | POA: Insufficient documentation

## 2013-12-13 DIAGNOSIS — E871 Hypo-osmolality and hyponatremia: Secondary | ICD-10-CM | POA: Insufficient documentation

## 2013-12-13 DIAGNOSIS — I951 Orthostatic hypotension: Secondary | ICD-10-CM

## 2013-12-13 DIAGNOSIS — IMO0002 Reserved for concepts with insufficient information to code with codable children: Secondary | ICD-10-CM

## 2013-12-13 DIAGNOSIS — K5730 Diverticulosis of large intestine without perforation or abscess without bleeding: Secondary | ICD-10-CM | POA: Insufficient documentation

## 2013-12-13 DIAGNOSIS — I509 Heart failure, unspecified: Secondary | ICD-10-CM | POA: Insufficient documentation

## 2013-12-13 DIAGNOSIS — J9600 Acute respiratory failure, unspecified whether with hypoxia or hypercapnia: Principal | ICD-10-CM | POA: Insufficient documentation

## 2013-12-13 DIAGNOSIS — I720 Aneurysm of carotid artery: Secondary | ICD-10-CM

## 2013-12-13 DIAGNOSIS — J449 Chronic obstructive pulmonary disease, unspecified: Secondary | ICD-10-CM

## 2013-12-13 DIAGNOSIS — W19XXXA Unspecified fall, initial encounter: Secondary | ICD-10-CM | POA: Insufficient documentation

## 2013-12-13 DIAGNOSIS — I519 Heart disease, unspecified: Secondary | ICD-10-CM

## 2013-12-13 DIAGNOSIS — Z961 Presence of intraocular lens: Secondary | ICD-10-CM | POA: Insufficient documentation

## 2013-12-13 DIAGNOSIS — R0902 Hypoxemia: Secondary | ICD-10-CM

## 2013-12-13 DIAGNOSIS — R1310 Dysphagia, unspecified: Secondary | ICD-10-CM | POA: Insufficient documentation

## 2013-12-13 DIAGNOSIS — S22009A Unspecified fracture of unspecified thoracic vertebra, initial encounter for closed fracture: Secondary | ICD-10-CM | POA: Insufficient documentation

## 2013-12-13 DIAGNOSIS — I714 Abdominal aortic aneurysm, without rupture, unspecified: Secondary | ICD-10-CM | POA: Insufficient documentation

## 2013-12-13 DIAGNOSIS — K589 Irritable bowel syndrome without diarrhea: Secondary | ICD-10-CM | POA: Insufficient documentation

## 2013-12-13 DIAGNOSIS — Z85118 Personal history of other malignant neoplasm of bronchus and lung: Secondary | ICD-10-CM | POA: Insufficient documentation

## 2013-12-13 LAB — CBC WITH DIFFERENTIAL/PLATELET
Basophils Absolute: 0 10*3/uL (ref 0.0–0.1)
Basophils Relative: 0 % (ref 0–1)
Eosinophils Absolute: 0.1 10*3/uL (ref 0.0–0.7)
Eosinophils Relative: 2 % (ref 0–5)
HCT: 42.2 % (ref 36.0–46.0)
Hemoglobin: 14.2 g/dL (ref 12.0–15.0)
Lymphocytes Relative: 9 % — ABNORMAL LOW (ref 12–46)
Lymphs Abs: 0.8 10*3/uL (ref 0.7–4.0)
MCH: 30.2 pg (ref 26.0–34.0)
MCHC: 33.6 g/dL (ref 30.0–36.0)
MCV: 89.8 fL (ref 78.0–100.0)
Monocytes Absolute: 0.5 10*3/uL (ref 0.1–1.0)
Monocytes Relative: 6 % (ref 3–12)
Neutro Abs: 6.6 10*3/uL (ref 1.7–7.7)
Neutrophils Relative %: 83 % — ABNORMAL HIGH (ref 43–77)
Platelets: 233 10*3/uL (ref 150–400)
RBC: 4.7 MIL/uL (ref 3.87–5.11)
RDW: 13.4 % (ref 11.5–15.5)
WBC: 8 10*3/uL (ref 4.0–10.5)

## 2013-12-13 LAB — URINALYSIS, ROUTINE W REFLEX MICROSCOPIC
Bilirubin Urine: NEGATIVE
Glucose, UA: NEGATIVE mg/dL
Hgb urine dipstick: NEGATIVE
Leukocytes, UA: NEGATIVE
Nitrite: NEGATIVE
Protein, ur: NEGATIVE mg/dL
Specific Gravity, Urine: >1.03 — ABNORMAL HIGH (ref 1.005–1.030)
Urobilinogen, UA: 0.2 mg/dL (ref 0.0–1.0)
pH: 6 (ref 5.0–8.0)

## 2013-12-13 LAB — BASIC METABOLIC PANEL
BUN: 13 mg/dL (ref 6–23)
CO2: 30 mEq/L (ref 19–32)
Calcium: 9.9 mg/dL (ref 8.4–10.5)
Chloride: 97 mEq/L (ref 96–112)
Creatinine, Ser: 0.67 mg/dL (ref 0.50–1.10)
GFR calc Af Amer: >90 mL/min (ref >90)
GFR calc non Af Amer: 86 mL/min — ABNORMAL LOW (ref >90)
Glucose, Bld: 114 mg/dL — ABNORMAL HIGH (ref 70–99)
Potassium: 4.5 mEq/L (ref 3.7–5.3)
Sodium: 136 mEq/L — ABNORMAL LOW (ref 137–147)

## 2013-12-13 LAB — LACTIC ACID, PLASMA: Lactic Acid, Venous: 0.8 mmol/L (ref 0.5–2.2)

## 2013-12-13 LAB — TROPONIN I: Troponin I: <0.3 ng/mL (ref <0.30)

## 2013-12-13 MED ORDER — SODIUM CHLORIDE 0.9 % IV SOLN
INTRAVENOUS | Status: DC
  Administered 2013-12-13 – 2013-12-14 (×2): via INTRAVENOUS

## 2013-12-13 MED ORDER — IPRATROPIUM-ALBUTEROL 0.5-2.5 (3) MG/3ML IN SOLN
3.0000 mL | Freq: Once | RESPIRATORY_TRACT | Status: AC
  Administered 2013-12-13: 3 mL via RESPIRATORY_TRACT
  Filled 2013-12-13: qty 3

## 2013-12-13 MED ORDER — SODIUM CHLORIDE 0.9 % IV BOLUS (SEPSIS)
250.0000 mL | Freq: Once | INTRAVENOUS | Status: AC
  Administered 2013-12-13: 250 mL via INTRAVENOUS

## 2013-12-13 MED ORDER — ALBUTEROL SULFATE (2.5 MG/3ML) 0.083% IN NEBU
2.5000 mg | INHALATION_SOLUTION | Freq: Once | RESPIRATORY_TRACT | Status: AC
Start: 2013-12-13 — End: 2013-12-13
  Administered 2013-12-13: 2.5 mg via RESPIRATORY_TRACT
  Filled 2013-12-13: qty 3

## 2013-12-13 NOTE — ED Notes (Signed)
Patient reports seen yesterday in ED and diagnosed with compression fracture. Complaining of feeling lightheaded. Also reports oxygen saturation at home was reading in upper 70s-80s.

## 2013-12-13 NOTE — ED Provider Notes (Signed)
CSN: [id number]     Arrival date & time 12/13/13  1936 History   First MD Initiated Contact with Patient 12/13/13 1951     Chief Complaint  Patient presents with  . Fatigue  . Dizziness     HPI Pt was seen at 2000. Per pt and her family, c/o gradual onset and worsening of persistent generalized weakness and fatigue that began earlier today. Has been associated with lightheadedness/near syncope. Pt states she "just didn't feel well today" so she checked her O2 Sat and "it was in the 70's." States she "usually runs low, but not that low." Family confirms this. Pt states she did take "some pain medicine" today for a new compression fracture in her back "after I fell yesterday." Denies CP/palpitations, no SOB/cough, no abd pain, no N/V/D, no fevers.       Past Medical History  Diagnosis Date  . COPD (chronic obstructive pulmonary disease)   . Anxiety disorder   . Hypothyroidism   . PUD (peptic ulcer disease)     89yrs  . Dysphagia   . IBS (irritable bowel syndrome)   . Diverticulosis   . Trigeminal neuralgia   . Diastolic dysfunction   . Depression   . Allergic rhinitis   . 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 Hiatal hernia   . Aortic aneurysm   . Complication of anesthesia 2011    bloodpressure dropped during colonoscopy, not problems since  . Shortness of breath     with exertion  . Aneurysm of carotid artery     corrected by surgery 08/21/13   Past Surgical History  Procedure Laterality Date  . 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 Esophagogastroduodenoscopy  11/13/2009    w/dilation to 47mm, gastric ulceration (H.Pylori) s/p treatment  . Esophagogastroduodenoscopy  11/21/2009    distal esophageal web, gastritis  . Lung cancer surgery  12/2010    Left VATS, minithoracotomy, LLL superior segmentectomy  . 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 Abdominal hysterectomy    . Cholecystectomy    . Tubal ligation    . Cataract extraction    . Fatty tumor removal from lt groin    . Cataract extraction w/phaco  09/02/2012    Procedure: CATARACT EXTRACTION PHACO AND INTRAOCULAR LENS PLACEMENT (IOC);  Surgeon: Elta Guadeloupe T. Gershon Crane, MD;  Location: [facility];  Service: Ophthalmology;  Laterality: Left;  CDE:10.35  . Endarterectomy Right 08/21/2013    Procedure: RIGHT CAROTID ANEURYSM RESECTION;  Surgeon: Rosetta Posner, MD;  Location: [facility] OR;  Service: Vascular;  Laterality: Right;   Family History  Problem Relation Age of Onset  . Liver cancer Sister   . Cancer Sister   . Pulmonary embolism Mother   . Heart attack Father   . Hypertension Father   . Cancer Brother   . Cancer Daughter    History  Substance Use Topics  . Smoking status: Former Smoker -- 0.50 packs/day for 20 years    Types: Cigarettes    Quit date: 12/21/2010  . Smokeless tobacco: Never Used     Comment: smoking cessation info given and reviewed   . Alcohol Use: Yes     Comment: seldom    Review of Systems ROS: Statement: All systems negative except as marked or noted in the HPI; Constitutional: Negative for fever and chills. +generalized weakness/fatigue. ; ;  Eyes: Negative for eye pain, redness and discharge. ; ; ENMT: Negative for ear pain, hoarseness, nasal congestion, sinus pressure and sore throat. ; ; Cardiovascular: Negative for chest pain, palpitations, diaphoresis, dyspnea and peripheral edema. ; ; Respiratory: Negative for cough, wheezing and stridor. ; ; Gastrointestinal: Negative for nausea, vomiting, diarrhea, abdominal pain, blood in stool, hematemesis, jaundice and rectal bleeding. . ; ; Genitourinary: Negative for dysuria, flank pain and hematuria. ; ; Musculoskeletal: Negative for neck pain. Negative for swelling and trauma.; ; Skin: Negative for pruritus, rash, abrasions, blisters, bruising and skin lesion.; ; Neuro: +lightheadedness, near syncope.  Negative for headache and neck stiffness. Negative for weakness, altered level of consciousness , altered mental status, extremity weakness, paresthesias, involuntary movement, seizure and syncope.      Allergies  Ciprofloxacin hcl; Iohexol; Prozac; and Sulfonamide derivatives  Home Medications   Current Outpatient Rx  Name  Route  Sig  Dispense  Refill  . alendronate (FOSAMAX) 70 MG tablet   Oral   Take 1 tablet (70 mg total) by mouth once a week. Take with a full glass of water on an empty stomach. Take on sundays   12 tablet   4   . calcium citrate-vitamin D (CITRACAL+D) 315-200 MG-UNIT per tablet   Oral   Take 1 tablet by mouth daily.          . Cholecalciferol (VITAMIN D-3) 5000 UNITS TABS   Oral   Take 5,000 Units by mouth daily.          . citalopram (CELEXA) 40 MG tablet   Oral   Take 1 tablet (40 mg total) by mouth at bedtime.   90 tablet   4   . diphenhydrAMINE (BENADRYL) 25 mg capsule   Oral   Take 25 mg by mouth at bedtime as needed for sleep.         . fish oil-omega-3 fatty acids 1000 MG capsule   Oral   Take 1 g by mouth daily.          Marland Kitchen HYDROcodone-acetaminophen (NORCO/VICODIN) 5-325 MG per tablet   Oral   Take 1-2 tablets by mouth every 6 (six) hours as needed for moderate pain.   30 tablet   0   . ibuprofen (ADVIL,MOTRIN) 200 MG tablet   Oral   Take 400 mg by mouth every 6 (six) hours as needed (for pain).         Marland Kitchen levothyroxine (SYNTHROID, LEVOTHROID) 25 MCG tablet   Oral   Take 1 tablet (25 mcg total) by mouth daily before breakfast.   90 tablet   3   . Multiple Vitamin (MULTIVITAMIN WITH MINERALS) TABS   Oral   Take 1 tablet by mouth daily.         Marland Kitchen senna-docusate (SENOKOT-S) 8.6-50 MG per tablet   Oral   Take 1 tablet by mouth at bedtime as needed for mild constipation.          BP 107/62  Pulse 87  Temp(Src) 98.7 F (37.1 C) (Oral)  Resp 16  Ht 5\' 6"  (1.676 m)  Wt 139 lb (63.05 kg)  BMI 22.45 kg/m2  SpO2  95% Filed Vitals:   12/13/13 2007 12/13/13 2126 12/13/13 2127 12/13/13 2129  BP: 107/62     Pulse: 87     Temp:      TempSrc:      Resp:      Height:      Weight:      SpO2:  79% 95% 95%    Physical Exam 2005: Physical examination:  Nursing notes reviewed; Vital signs and O2 SAT reviewed;  Constitutional: Thin, frail. In no acute distress; Head:  Normocephalic, atraumatic; Eyes: EOMI, PERRL, No scleral icterus; ENMT: Mouth and pharynx normal, Mucous membranes dry; Neck: Supple, Full range of motion, No lymphadenopathy; Cardiovascular: Regular rate and rhythm, No gallop; Respiratory: Breath sounds diminished & equal bilaterally, No wheezes.  Speaking full sentences, Normal respiratory effort/excursion. No retrax or access mm use.; Chest: Nontender, Movement normal; Abdomen: Soft, Nontender, Nondistended, Normal bowel sounds; Genitourinary: No CVA tenderness; Extremities: Pulses normal, No tenderness, No edema, No calf edema or asymmetry.; Neuro: AA&Ox3, Major CN grossly intact. No facial droop. Speech clear. Grips equal. Strength 5/5 equal bilat UE's and LE's. Moves all extremities spontaneously without apparent gross focal motor deficits.; Skin: Color normal, Warm, Dry.    ED Course  Procedures      EKG Interpretation   Date/Time:  Sunday December 13 2013 19:50:44 EDT Ventricular Rate:  78 PR Interval:  152 QRS Duration: 88 QT Interval:  364 QTC Calculation: 414 R Axis:   41 Text Interpretation:  Normal sinus rhythm Baseline wander Possible  Anterior infarct (cited on or before 28-Aug-2012) Abnormal ECG When  compared with ECG of 28-Aug-2012 10:16, Nonspecific T wave abnormality no  longer evident in Anterior leads Confirmed by [facility]  MD, Nunzio Cory  [phone]) on 12/13/2013 8:25:54 PM      MDM  MDM Reviewed: previous chart, nursing note and vitals Reviewed previous: labs, ECG and x-ray Interpretation: labs, ECG and x-ray    Results for orders placed during the hospital  encounter of 12/13/13  URINALYSIS, ROUTINE W REFLEX MICROSCOPIC      Result Value Ref Range   Color, Urine YELLOW  YELLOW   APPearance CLEAR  CLEAR   Specific Gravity, Urine >1.030 (*) 1.005 - 1.030   pH 6.0  5.0 - 8.0   Glucose, UA NEGATIVE  NEGATIVE mg/dL   Hgb urine dipstick NEGATIVE  NEGATIVE   Bilirubin Urine NEGATIVE  NEGATIVE   Ketones, ur TRACE (*) NEGATIVE mg/dL   Protein, ur NEGATIVE  NEGATIVE mg/dL   Urobilinogen, UA 0.2  0.0 - 1.0 mg/dL   Nitrite NEGATIVE  NEGATIVE   Leukocytes, UA NEGATIVE  NEGATIVE  CBC WITH DIFFERENTIAL      Result Value Ref Range   WBC 8.0  4.0 - 10.5 K/uL   RBC 4.70  3.87 - 5.11 MIL/uL   Hemoglobin 14.2  12.0 - 15.0 g/dL   HCT 42.2  36.0 - 46.0 %   MCV 89.8  78.0 - 100.0 fL   MCH 30.2  26.0 - 34.0 pg   MCHC 33.6  30.0 - 36.0 g/dL   RDW 13.4  11.5 - 15.5 %   Platelets 233  150 - 400 K/uL   Neutrophils Relative % 83 (*) 43 - 77 %   Neutro Abs 6.6  1.7 - 7.7 K/uL   Lymphocytes Relative 9 (*) 12 - 46 %   Lymphs Abs 0.8  0.7 - 4.0 K/uL   Monocytes Relative 6  3 - 12 %   Monocytes Absolute 0.5  0.1 - 1.0 K/uL   Eosinophils Relative 2  0 - 5 %   Eosinophils Absolute 0.1  0.0 - 0.7 K/uL   Basophils Relative 0  0 - 1 %   Basophils Absolute 0.0  0.0 - 0.1 K/uL  BASIC METABOLIC PANEL      Result Value Ref Range  Sodium 136 (*) 137 - 147 mEq/L   Potassium 4.5  3.7 - 5.3 mEq/L   Chloride 97  96 - 112 mEq/L   CO2 30  19 - 32 mEq/L   Glucose, Bld 114 (*) 70 - 99 mg/dL   BUN 13  6 - 23 mg/dL   Creatinine, Ser 0.67  0.50 - 1.10 mg/dL   Calcium 9.9  8.4 - 10.5 mg/dL   GFR calc non Af Amer 86 (*) >90 mL/min   GFR calc Af Amer >90  >90 mL/min  TROPONIN I      Result Value Ref Range   Troponin I <0.30  <0.30 ng/mL  LACTIC ACID, PLASMA      Result Value Ref Range   Lactic Acid, Venous 0.8  0.5 - 2.2 mmol/L   Dg Lumbar Spine Complete 12/12/2013   CLINICAL DATA:  Back pain and flank pain  EXAM: LUMBAR SPINE - COMPLETE 4+ VIEW  COMPARISON:  None   FINDINGS: There is a scoliosis deformity involving the lumbar spine. Multi level disc space narrowing and ventral endplate spurring is identified compatible with degenerative disc disease. T12 compression deformity appears new from the previous examination. No retropulsion of fracture fragments identified. There is calcified atherosclerotic disease involving the abdominal aorta. Aneurysmal dilatation of the aorta measures up to 4.1 cm.  IMPRESSION: 1. Scoliosis and multilevel degenerative disc disease. 2. T12 compression deformity is age indeterminate but is new from 08/19/2013 3. Atherosclerotic disease with abdominal aortic aneurysm. Recommend follow up by [place] in 1year. This recommendation follows ACR consensus guidelines: White Paper of the ACR Incidental Findings Committee II on Vascular Findings. Joellyn Rued [id number]; 28:315-176.   Electronically Signed   By: Kerby Moors M.D.   On: 12/12/2013 11:56   Dg Chest Port 1 View 12/13/2013   CLINICAL DATA:  Short of breath.  Hypoxia.  EXAM: PORTABLE CHEST - 1 VIEW  COMPARISON:  08/19/2013  FINDINGS: Lungs are hyperexpanded. There is prominence of the bronchovascular markings, but no consolidation or edema. Scarring is noted in the left lung base, stable. No pleural effusion or pneumothorax.  There are surgical vascular clips along the left hilum, stable.  Cardiac silhouette is normal in size. No mediastinal or hilar masses.  Bony thorax is demineralized but intact.  IMPRESSION: No acute cardiopulmonary disease.  COPD.   Electronically Signed   By: Lajean Manes M.D.   On: 12/13/2013 20:44     2145:  Pt orthostatic on VS, unsteady with standing; will dose gentle IVF. O2 Sat progressively drops as pt moves from lying, sitting, standing, and walking. Walking O2 Sats dropped to 72% R/A. Pt does not have N/C O2 at home. Will dose neb, as pt does have hx of COPD, and maintain O2 N/C. Dx and testing d/w pt and family.  Questions answered.  Verb understanding, agreeable  to observation admit.  T/C to [facility] Dr. Shanon Brow, case discussed, including:  HPI, pertinent PM/SHx, VS/PE, dx testing, ED course and treatment:  Agreeable to observation admit, requests to write temporary orders, obtain medical bed to team 1.       Alfonzo Feller, DO 12/14/13 [phone]

## 2013-12-13 NOTE — H&P (Signed)
PCP:   PICKARD,WARREN TOM, MD   Chief Complaint:  o2 sats low at home  HPI: 73 yo female h/o lung cancer s/p resection, copd, diastolic chf comes in today because she was getting some dizziness with activity.  Checked her oxygen sats and it was mid 35s.  Denies any sob or feveres or cough.  No swelling.  Pt was seen in ED yesterday dx with compression fracture after a fall and started on narcotics.  This is helping her pain, but does make her sedated.  She is chronically not on oxygen, and has never been told she needs it.    Review of Systems:  Positive and negative as per HPI otherwise all other systems are negative  Past Medical History: Past Medical History  Diagnosis Date  . COPD (chronic obstructive pulmonary disease)   . Anxiety disorder   . Hypothyroidism   . PUD (peptic ulcer disease)     50yrs  . Dysphagia   . IBS (irritable bowel syndrome)   . Diverticulosis   . Trigeminal neuralgia   . Diastolic dysfunction   . Depression   . Allergic rhinitis   . 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 Hiatal hernia   . Aortic aneurysm   . Complication of anesthesia 2011    bloodpressure dropped during colonoscopy, not problems since  . Shortness of breath     with exertion  . Aneurysm of carotid artery     corrected by surgery 08/21/13   Past Surgical History  Procedure Laterality Date  . 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 Esophagogastroduodenoscopy  11/13/2009    w/dilation to 41mm, gastric ulceration (H.Pylori) s/p treatment  . Esophagogastroduodenoscopy  11/21/2009    distal esophageal web, gastritis  . Lung cancer surgery  12/2010    Left VATS, minithoracotomy, LLL superior segmentectomy  . Vocal cord surgery  02/06/2011    laryngoscopy with bilateral vocal cord Radiesse injection for vocal cord paralysis  . Abdominal hysterectomy    . Cholecystectomy    . Tubal ligation    . Cataract extraction    . Fatty  tumor removal from lt groin    . Cataract extraction w/phaco  09/02/2012    Procedure: CATARACT EXTRACTION PHACO AND INTRAOCULAR LENS PLACEMENT (IOC);  Surgeon: Elta Guadeloupe T. Gershon Crane, MD;  Location: [facility];  Service: Ophthalmology;  Laterality: Left;  CDE:10.35  . Endarterectomy Right 08/21/2013    Procedure: RIGHT CAROTID ANEURYSM RESECTION;  Surgeon: Rosetta Posner, MD;  Location: [facility];  Service: Vascular;  Laterality: Right;    Medications: Prior to Admission medications   Medication Sig Start Date End Date Taking? Authorizing Provider  alendronate (FOSAMAX) 70 MG tablet Take 1 tablet (70 mg total) by mouth once a week. Take with a full glass of water on an empty stomach. Take on sundays 12/02/13  Yes [name], MD  calcium citrate-vitamin D (CITRACAL+D) 315-200 MG-UNIT per tablet Take 1 tablet by mouth daily.    Yes Historical Provider, MD  Cholecalciferol (VITAMIN D-3) 5000 UNITS TABS Take 5,000 Units by mouth daily.    Yes Historical Provider, MD  citalopram (CELEXA) 40 MG tablet Take 1 tablet (40 mg total) by mouth at bedtime. 12/02/13  Yes [name], MD  diphenhydrAMINE (BENADRYL) 25 mg capsule Take 25 mg by mouth at bedtime as needed for sleep.   Yes Historical Provider, MD  fish oil-omega-3 fatty acids 1000 MG  capsule Take 1 g by mouth daily.    Yes Historical Provider, MD  HYDROcodone-acetaminophen (NORCO/VICODIN) 5-325 MG per tablet Take 1-2 tablets by mouth every 6 (six) hours as needed for moderate pain. 12/12/13  Yes Merryl Hacker, MD  ibuprofen (ADVIL,MOTRIN) 200 MG tablet Take 400 mg by mouth every 6 (six) hours as needed (for pain).   Yes Historical Provider, MD  levothyroxine (SYNTHROID, LEVOTHROID) 25 MCG tablet Take 1 tablet (25 mcg total) by mouth daily before breakfast. 12/02/13  Yes [name], MD  Multiple Vitamin (MULTIVITAMIN WITH MINERALS) TABS Take 1 tablet by mouth daily.   Yes Historical Provider, MD  senna-docusate (SENOKOT-S) 8.6-50 MG per tablet Take  1 tablet by mouth at bedtime as needed for mild constipation.   Yes Historical Provider, MD    Allergies:   Allergies  Allergen Reactions  . Ciprofloxacin Hcl Hives  . Iohexol      Desc: Per [facility], pt is allergic to IV contrast, no type of reaction was available. Pt cannot remember but first reacted around 15 yrs ago from an IVP. Notes were date from 2009 at urology center., Onset Date: [id number]   . Prozac [Fluoxetine Hcl] Rash  . Sulfonamide Derivatives Hives and Rash    Social History:  reports that she quit smoking about 2 years ago. Her smoking use included Cigarettes. She has a 10 pack-year smoking history. She has never used smokeless tobacco. She reports that she drinks alcohol. She reports that she does not use illicit drugs.  Family History: Family History  Problem Relation Age of Onset  . Liver cancer Sister   . Cancer Sister   . Pulmonary embolism Mother   . Heart attack Father   . Hypertension Father   . Cancer Brother   . Cancer Daughter     Physical Exam: Filed Vitals:   12/13/13 2007 12/13/13 2126 12/13/13 2127 12/13/13 2129  BP: 107/62     Pulse: 87     Temp:      TempSrc:      Resp:      Height:      Weight:      SpO2:  79% 95% 95%   General appearance: alert, cooperative and no distress Head: Normocephalic, without obvious abnormality, atraumatic Eyes: negative Nose: Nares normal. Septum midline. Mucosa normal. No drainage or sinus tenderness. Neck: no JVD and supple, symmetrical, trachea midline Lungs: clear to auscultation bilaterally Heart: regular rate and rhythm, S1, S2 normal, no murmur, click, rub or gallop Abdomen: soft, non-tender; bowel sounds normal; no masses,  no organomegaly Extremities: extremities normal, atraumatic, no cyanosis or edema Pulses: 2+ and symmetric Skin: Skin color, texture, turgor normal. No rashes or lesions Neurologic: Grossly normal   Labs on Admission:   Recent Labs  12/13/13 2030  NA 136*  K  4.5  CL 97  CO2 30  GLUCOSE 114*  BUN 13  CREATININE 0.67  CALCIUM 9.9    Recent Labs  12/13/13 2030  WBC 8.0  NEUTROABS 6.6  HGB 14.2  HCT 42.2  MCV 89.8  PLT 233    Recent Labs  12/13/13 2030  TROPONINI <0.30   Radiolgical Exams on Admission: Dg Lumbar Spine Complete  12/12/2013   CLINICAL DATA:  Back pain and flank pain  EXAM: LUMBAR SPINE - COMPLETE 4+ VIEW  COMPARISON:  None  FINDINGS: There is a scoliosis deformity involving the lumbar spine. Multi level disc space narrowing and ventral endplate spurring is identified compatible with  degenerative disc disease. T12 compression deformity appears new from the previous examination. No retropulsion of fracture fragments identified. There is calcified atherosclerotic disease involving the abdominal aorta. Aneurysmal dilatation of the aorta measures up to 4.1 cm.  IMPRESSION: 1. Scoliosis and multilevel degenerative disc disease. 2. T12 compression deformity is age indeterminate but is new from 08/19/2013 3. Atherosclerotic disease with abdominal aortic aneurysm. Recommend follow up by [place] in 1year. This recommendation follows ACR consensus guidelines: White Paper of the ACR Incidental Findings Committee II on Vascular Findings. Joellyn Rued [id number]; 36:067-703.   Electronically Signed   By: Kerby Moors M.D.   On: 12/12/2013 11:56   Dg Pelvis 1-2 Views  12/12/2013   CLINICAL DATA:  Hip pain after fall.  EXAM: PELVIS - 1-2 VIEW  COMPARISON:  11/13/2010  FINDINGS: Pelvic bony ring is intact. No gross abnormality to the SI joints. Disc space changes at L4-L5. Sclerosis at the right hip joint and unclear if this is related to the acetabulum or femoral head. No evidence for a gross fracture or dislocation. Mild irregularity along the superior aspect of the right femoral head and neck appears to be degenerative in etiology.  IMPRESSION: No acute bone abnormality to the pelvis.  Nonspecific sclerosis in the right hip. This may be better  characterized with a dedicated right hip examination.   Electronically Signed   By: Markus Daft M.D.   On: 12/12/2013 12:00   Dg Chest Port 1 View  12/13/2013   CLINICAL DATA:  Short of breath.  Hypoxia.  EXAM: PORTABLE CHEST - 1 VIEW  COMPARISON:  08/19/2013  FINDINGS: Lungs are hyperexpanded. There is prominence of the bronchovascular markings, but no consolidation or edema. Scarring is noted in the left lung base, stable. No pleural effusion or pneumothorax.  There are surgical vascular clips along the left hilum, stable.  Cardiac silhouette is normal in size. No mediastinal or hilar masses.  Bony thorax is demineralized but intact.  IMPRESSION: No acute cardiopulmonary disease.  COPD.   Electronically Signed   By: Lajean Manes M.D.   On: 12/13/2013 20:44    Assessment/Plan  73 yo female with hypoxia likely due to decrease in respiratory drive from recent use of narcotics with h/o copd and lung resection  Principal Problem:   Hypoxia-  Highly suspect this is due to pain meds that was just started yesterday.  She says she has not been eating or drinking well either since she tried to move her couch, fell and hurt her back.  No signs of infection or edema.  May  Need home oxygen temporarily, or could try managing pain with just nsaids.  obs overnight.  Will give gentle ivf for mild dehydration.  Active Problems:   Adenocarcinoma of lung   Diastolic dysfunction   Compression fracture    Yida Hyams A 12/13/2013, 9:48 PM

## 2013-12-14 ENCOUNTER — Encounter (HOSPITAL_COMMUNITY): Payer: Self-pay | Admitting: *Deleted

## 2013-12-14 DIAGNOSIS — K5900 Constipation, unspecified: Secondary | ICD-10-CM | POA: Diagnosis present

## 2013-12-14 DIAGNOSIS — S22000A Wedge compression fracture of unspecified thoracic vertebra, initial encounter for closed fracture: Secondary | ICD-10-CM

## 2013-12-14 DIAGNOSIS — E871 Hypo-osmolality and hyponatremia: Secondary | ICD-10-CM | POA: Diagnosis present

## 2013-12-14 DIAGNOSIS — J9600 Acute respiratory failure, unspecified whether with hypoxia or hypercapnia: Principal | ICD-10-CM

## 2013-12-14 DIAGNOSIS — S22009A Unspecified fracture of unspecified thoracic vertebra, initial encounter for closed fracture: Secondary | ICD-10-CM

## 2013-12-14 DIAGNOSIS — J449 Chronic obstructive pulmonary disease, unspecified: Secondary | ICD-10-CM

## 2013-12-14 DIAGNOSIS — J9601 Acute respiratory failure with hypoxia: Secondary | ICD-10-CM | POA: Diagnosis present

## 2013-12-14 LAB — TROPONIN I
Troponin I: <0.3 ng/mL (ref <0.30)
Troponin I: <0.3 ng/mL (ref <0.30)
Troponin I: <0.3 ng/mL (ref <0.30)

## 2013-12-14 LAB — URINE CULTURE
Colony Count: NO GROWTH
Culture: NO GROWTH

## 2013-12-14 LAB — BASIC METABOLIC PANEL
BUN: 11 mg/dL (ref 6–23)
CO2: 30 mEq/L (ref 19–32)
Calcium: 9.4 mg/dL (ref 8.4–10.5)
Chloride: 97 mEq/L (ref 96–112)
Creatinine, Ser: 0.59 mg/dL (ref 0.50–1.10)
GFR calc Af Amer: >90 mL/min (ref >90)
GFR calc non Af Amer: 89 mL/min — ABNORMAL LOW (ref >90)
Glucose, Bld: 144 mg/dL — ABNORMAL HIGH (ref 70–99)
Potassium: 3.9 mEq/L (ref 3.7–5.3)
Sodium: 136 mEq/L — ABNORMAL LOW (ref 137–147)

## 2013-12-14 LAB — CBC
HCT: 39.3 % (ref 36.0–46.0)
Hemoglobin: 13.2 g/dL (ref 12.0–15.0)
MCH: 30 pg (ref 26.0–34.0)
MCHC: 33.6 g/dL (ref 30.0–36.0)
MCV: 89.3 fL (ref 78.0–100.0)
Platelets: 221 10*3/uL (ref 150–400)
RBC: 4.4 MIL/uL (ref 3.87–5.11)
RDW: 13.4 % (ref 11.5–15.5)
WBC: 8.6 10*3/uL (ref 4.0–10.5)

## 2013-12-14 MED ORDER — DIPHENHYDRAMINE HCL 25 MG PO CAPS
25.0000 mg | ORAL_CAPSULE | Freq: Every evening | ORAL | Status: DC | PRN
  Administered 2013-12-14: 25 mg via ORAL
  Filled 2013-12-14: qty 1

## 2013-12-14 MED ORDER — SENNOSIDES-DOCUSATE SODIUM 8.6-50 MG PO TABS
1.0000 | ORAL_TABLET | Freq: Every evening | ORAL | Status: DC | PRN
  Administered 2013-12-14: 1 via ORAL
  Filled 2013-12-14: qty 1

## 2013-12-14 MED ORDER — HYDROCODONE-ACETAMINOPHEN 5-325 MG PO TABS
1.0000 | ORAL_TABLET | Freq: Four times a day (QID) | ORAL | Status: DC | PRN
  Administered 2013-12-14: 1 via ORAL
  Filled 2013-12-14: qty 1

## 2013-12-14 MED ORDER — HYDROGEN PEROXIDE 3 % EX SOLN
CUTANEOUS | Status: AC
  Filled 2013-12-14: qty 473

## 2013-12-14 MED ORDER — MAGNESIUM HYDROXIDE 400 MG/5ML PO SUSP
30.0000 mL | Freq: Every day | ORAL | Status: DC | PRN
  Administered 2013-12-14: 30 mL via ORAL
  Filled 2013-12-14: qty 30

## 2013-12-14 MED ORDER — IPRATROPIUM BROMIDE 0.02 % IN SOLN: 0.5000 mg | Freq: Four times a day (QID) | RESPIRATORY_TRACT | Status: DC

## 2013-12-14 MED ORDER — CITALOPRAM HYDROBROMIDE 20 MG PO TABS
40.0000 mg | ORAL_TABLET | Freq: Every day | ORAL | Status: DC
  Administered 2013-12-14: 40 mg via ORAL
  Filled 2013-12-14: qty 2

## 2013-12-14 MED ORDER — IPRATROPIUM BROMIDE 0.02 % IN SOLN
RESPIRATORY_TRACT | Status: AC
  Administered 2013-12-14: 0.5 mg
  Filled 2013-12-14: qty 2.5

## 2013-12-14 MED ORDER — ALBUTEROL SULFATE (2.5 MG/3ML) 0.083% IN NEBU: 2.5000 mg | INHALATION_SOLUTION | Freq: Four times a day (QID) | RESPIRATORY_TRACT | Status: DC

## 2013-12-14 MED ORDER — IPRATROPIUM-ALBUTEROL 0.5-2.5 (3) MG/3ML IN SOLN
3.0000 mL | Freq: Four times a day (QID) | RESPIRATORY_TRACT | Status: DC
  Administered 2013-12-14 (×3): 3 mL via RESPIRATORY_TRACT
  Filled 2013-12-14 (×3): qty 3

## 2013-12-14 MED ORDER — LEVOTHYROXINE SODIUM 25 MCG PO TABS
25.0000 ug | ORAL_TABLET | Freq: Every day | ORAL | Status: DC
  Administered 2013-12-14 – 2013-12-15 (×2): 25 ug via ORAL
  Filled 2013-12-14 (×2): qty 1

## 2013-12-14 MED ORDER — SODIUM CHLORIDE 0.9 % IV SOLN: INTRAVENOUS | Status: DC

## 2013-12-14 MED ORDER — ALBUTEROL SULFATE (2.5 MG/3ML) 0.083% IN NEBU: 2.5000 mg | INHALATION_SOLUTION | RESPIRATORY_TRACT | Status: DC | PRN

## 2013-12-14 MED ORDER — HYDROCODONE-ACETAMINOPHEN 5-325 MG PO TABS
1.0000 | ORAL_TABLET | Freq: Four times a day (QID) | ORAL | Status: DC | PRN
  Administered 2013-12-14 (×2): 1 via ORAL
  Filled 2013-12-14 (×2): qty 1

## 2013-12-14 MED ORDER — CITALOPRAM HYDROBROMIDE 20 MG PO TABS
20.0000 mg | ORAL_TABLET | Freq: Every day | ORAL | Status: DC
  Administered 2013-12-14: 20 mg via ORAL
  Filled 2013-12-14: qty 1

## 2013-12-14 MED ORDER — ALBUTEROL SULFATE (2.5 MG/3ML) 0.083% IN NEBU
INHALATION_SOLUTION | RESPIRATORY_TRACT | Status: AC
  Administered 2013-12-14: 2.5 mg
  Filled 2013-12-14: qty 3

## 2013-12-14 MED ORDER — IBUPROFEN 400 MG PO TABS
400.0000 mg | ORAL_TABLET | Freq: Four times a day (QID) | ORAL | Status: DC | PRN
Start: 2013-12-14 — End: 2013-12-15

## 2013-12-14 MED ORDER — IPRATROPIUM-ALBUTEROL 0.5-2.5 (3) MG/3ML IN SOLN
3.0000 mL | Freq: Three times a day (TID) | RESPIRATORY_TRACT | Status: DC
  Administered 2013-12-15 (×2): 3 mL via RESPIRATORY_TRACT
  Filled 2013-12-14: qty 3

## 2013-12-14 MED ORDER — ADULT MULTIVITAMIN W/MINERALS CH
1.0000 | ORAL_TABLET | Freq: Every day | ORAL | Status: DC
  Administered 2013-12-14 – 2013-12-15 (×2): 1 via ORAL
  Filled 2013-12-14 (×2): qty 1

## 2013-12-14 MED ORDER — ALBUTEROL SULFATE (2.5 MG/3ML) 0.083% IN NEBU: 2.5000 mg | INHALATION_SOLUTION | RESPIRATORY_TRACT | Status: DC

## 2013-12-14 NOTE — Progress Notes (Signed)
Patient ambulated in hallway with assistance. O2 saturation 90% at start of ambulation. Sats dropped to 84% with 2L [place]. Patient tolerated ambulation fair.

## 2013-12-14 NOTE — Progress Notes (Signed)
[facility] PROGRESS NOTE  [name] VPX:[id number] DOB: December 23, 1940 DOA: 12/13/2013 PCP: [name], MD  Assessment/Plan: Principal Problem:  Acute respiratory failure with hypoxia- Highly suspect this is due to pain meds recently started and taken every 6 hours. Pt with hx COPD (not on home oxygen, lung ca s/p resection 2012 and hypoxia after carotid aneurysm surgery i11/14. Continues with hypoxia with ambulation and oxygen supplemenation. Will provide incentive spirometry. Will control back pain without oversedating by decreasing narcotics. Consider nebs. Wells score 1.5. Doubt PE. Monitor   Active Problems:  Compression fracture: T12 compression deformity age indeterminate but new from 11/14. Pain management as above. PT evaluation. May need short term snf.   Hyponatremia: mild. Likely related to decreased po intake over last few day. Will gently hydrate. Recheck in am.   Diastolic dysfunction: chart review unable to find recent echo. Not on medication at home. Monitor intake and output. Appears stable  Adenocarcinoma of lung: s/p resection 2012. Recent follow up 12/14 remains cancer free.   Abdominal aortic aneurysm: stable. Follow up US 6 months.   COPD: not on home oxygen or inhalers. No wheezing on exam. Hx of some hypoxia with exertion noted in chart. Monitor.    Code Status: full Family Communication: none present Disposition Plan: home when ready   Consultants:  none  Procedures:  none  Antibiotics:  none  HPI/Subjective: Lying in bed and reports feeling "a very little bit better". Continues to complain of pain with movement and breathing and coughing.   Objective: Filed Vitals:   12/14/13 0405  BP: 103/67  Pulse: 84  Temp: 98.1 F (36.7 C)  Resp: 18    Intake/Output Summary (Last 24 hours) at 12/14/13 1223 Last data filed at 12/14/13 0847  Gross per 24 hour  Intake 1167.5 ml  Output      1 ml  Net 1166.5 ml   Filed Weights   12/13/13 1940 12/14/13 0405  Weight: 63.05 kg (139 lb) 61.1 kg (134 lb 11.2 oz)    Exam:   General:  Thin frail NAD  Cardiovascular: RRR No MGR no LE edema  Respiratory: normal effort but slightly shallow. Poor cough effort. BS diminished throughout. No wheeze no crackles  Abdomen: flat soft +BS non-tender to palpation  Musculoskeletal: no clubbing or cyanosis   Data Reviewed: Basic Metabolic Panel:  Recent Labs Lab 12/13/13 2030 12/14/13 0152  NA 136* 136*  K 4.5 3.9  CL 97 97  CO2 30 30  GLUCOSE 114* 144*  BUN 13 11  CREATININE 0.67 0.59  CALCIUM 9.9 9.4   Liver Function Tests: No results found for this basename: AST, ALT, ALKPHOS, BILITOT, PROT, ALBUMIN,  in the last 168 hours No results found for this basename: LIPASE, AMYLASE,  in the last 168 hours No results found for this basename: AMMONIA,  in the last 168 hours CBC:  Recent Labs Lab 12/13/13 2030 12/14/13 0152  WBC 8.0 8.6  NEUTROABS 6.6  --   HGB 14.2 13.2  HCT 42.2 39.3  MCV 89.8 89.3  PLT 233 221   Cardiac Enzymes:  Recent Labs Lab 12/13/13 2030 12/14/13 0152 12/14/13 0816  TROPONINI <0.30 <0.30 <0.30   BNP (last 3 results) No results found for this basename: PROBNP,  in the last 8760 hours CBG: No results found for this basename: GLUCAP,  in the last 168 hours  No results found for this or any previous visit (from the past 240 hour(s)).   Studies: Dg Chest [facility]  1 View  12/13/2013   CLINICAL DATA:  Short of breath.  Hypoxia.  EXAM: PORTABLE CHEST - 1 VIEW  COMPARISON:  08/19/2013  FINDINGS: Lungs are hyperexpanded. There is prominence of the bronchovascular markings, but no consolidation or edema. Scarring is noted in the left lung base, stable. No pleural effusion or pneumothorax.  There are surgical vascular clips along the left hilum, stable.  Cardiac silhouette is normal in size. No mediastinal or hilar masses.  Bony thorax is demineralized but intact.  IMPRESSION: No acute  cardiopulmonary disease.  COPD.   Electronically Signed   By: Lajean Manes M.D.   On: 12/13/2013 20:44    Scheduled Meds: . citalopram  40 mg Oral QHS  . ipratropium-albuterol  3 mL Nebulization Q6H  . levothyroxine  25 mcg Oral QAC breakfast  . multivitamin with minerals  1 tablet Oral Daily   Continuous Infusions: . sodium chloride 50 mL/hr at 12/14/13 1130    Principal Problem:   Acute respiratory failure with hypoxia Active Problems:   Adenocarcinoma of lung   Abdominal aneurysm without mention of rupture   Hypoxia   Diastolic dysfunction   Compression fracture   Constipation   Hyponatremia    Time spent: 35 minutes    [facility] Pager [phone]. If 7PM-7AM, please contact night-coverage at [email or web address], password [facility] 12/14/2013, 12:23 PM  LOS: 1 day

## 2013-12-14 NOTE — Progress Notes (Signed)
Patient seen, independently examined and chart reviewed. I agree with exam, assessment and plan discussed with Ruth Carrel, NP.  Very pleasant 73 year old woman with history of apparently subclinical COPD, lung cancer status post resection 2012, not on home oxygen or inhalers presented to the emergency department with history of dizziness and generalized weakness, checked her oxygen at home and found it was quite low in the 70s. She fell 3/7 and sustained a vertebral compression fracture and was discharged home on pain medication which she has been taking regularly. She was noted to be orthostatic in significantly hypoxic in the emergency department without any history to suggest acute pulmonary abnormality. No chest pain.  PMH COPD, lung cancer status post lung resection  Subjective: She feels better today. Pain is manageable. She has not had any narcotics since this morning. Breathing fine.  Objective: Appears calm and comfortable. Speech fluent and clear. 92% on 2 L. Cardiovascular regular rate and rhythm. Respiratory clear to auscultation bilaterally. No wheezes, rales or rhonchi. Normal respiratory effort. No lower extremity edema.  Basic metabolic panel unremarkable. Troponins negative. CBC unremarkable. Urine culture pending but urinalysis was unremarkable. Lumbar spine films from day prior to admission showed T12 compression deformity without complicating features. Atherosclerotic disease with abdominal aortic aneurysm was also seen. Chest x-ray no acute disease. COPD seen. EKG showed normal sinus rhythm, anterior infarct, old compared to previous study 08/28/2012.  Acute respiratory failure with hypoxia seems to be improving. There are no signs or symptoms of infection or acute pulmonary process. Well's score = 1 (h/o malignancy). Her history is most suggestive of acute on chronic hypoxia secondary to COPD and history of lung surgery (see EDP HPI) complicated by scheduled administration of  narcotics. She is clinically improving. Plan to lower the dose of narcotics, encouraged and so spirometry, wean oxygen as tolerated. She may require home oxygen because of her underlying COPD. Recheck in the morning and likely discharge him at that time.  Her pain seems to be better controlled now and she is unable to ambulate. Outpatient referral to PT recommended if pain does not improve could consider followup with neurosurgery or interventional radiology.  Ruth Hodgkins, MD [facility] [phone]

## 2013-12-14 NOTE — Progress Notes (Addendum)
PT lying in bed no resp distress , no wheezes noted , FiO2 on 4 lpm sat 91 percent.

## 2013-12-14 NOTE — Progress Notes (Signed)
Patient ambulated in hallway with NT. O2 saturation 91% RA prior to ambulation. O2 dropped to 84%. Returned patient to room and reapplied O2 @ 2L [place].

## 2013-12-14 NOTE — Progress Notes (Signed)
UR completed 

## 2013-12-14 NOTE — Care Management Note (Signed)
    Page 1 of 1   12/14/2013     2:02:58 PM   CARE MANAGEMENT NOTE 12/14/2013  Patient:  [name], [name]   Account Number:  [id number]  Date Initiated:  12/14/2013  Documentation initiated by:  Theophilus Kinds  Subjective/Objective Assessment:   Pt admitted from home with hypoxia. Pt lives alone in a senior citizen apartment building and has neighbors who are nearby to help as needed. Pt is independent with ADL'.s     Action/Plan:   Will continue to follow for discharge planning needs.   Anticipated DC Date:  12/15/2013   Anticipated DC Plan:  [place]  CM consult      Choice offered to / List presented to:             Status of service:  Completed, signed off Medicare Important Message given?   (If response is "NO", the following Medicare IM given date fields will be blank) Date Medicare IM given:   Date Additional Medicare IM given:    Discharge Disposition:  HOME/SELF CARE  Per UR Regulation:    If discussed at Long Length of Stay Meetings, dates discussed:    Comments:  12/14/13 [place], RN BSN CM

## 2013-12-15 DIAGNOSIS — J449 Chronic obstructive pulmonary disease, unspecified: Secondary | ICD-10-CM

## 2013-12-15 DIAGNOSIS — J4489 Other specified chronic obstructive pulmonary disease: Secondary | ICD-10-CM

## 2013-12-15 DIAGNOSIS — Z9981 Dependence on supplemental oxygen: Secondary | ICD-10-CM

## 2013-12-15 HISTORY — DX: Dependence on supplemental oxygen: Z99.81

## 2013-12-15 LAB — BASIC METABOLIC PANEL
BUN: 7 mg/dL (ref 6–23)
CO2: 27 mEq/L (ref 19–32)
Calcium: 9.4 mg/dL (ref 8.4–10.5)
Chloride: 98 mEq/L (ref 96–112)
Creatinine, Ser: 0.45 mg/dL — ABNORMAL LOW (ref 0.50–1.10)
GFR calc Af Amer: >90 mL/min (ref >90)
GFR calc non Af Amer: >90 mL/min (ref >90)
Glucose, Bld: 112 mg/dL — ABNORMAL HIGH (ref 70–99)
Potassium: 4.4 mEq/L (ref 3.7–5.3)
Sodium: 136 mEq/L — ABNORMAL LOW (ref 137–147)

## 2013-12-15 MED ORDER — DSS 100 MG PO CAPS: 100.0000 mg | ORAL_CAPSULE | Freq: Two times a day (BID) | ORAL | Status: DC

## 2013-12-15 MED ORDER — POLYETHYLENE GLYCOL 3350 17 G PO PACK
17.0000 g | PACK | Freq: Two times a day (BID) | ORAL | Status: DC
  Administered 2013-12-15: 17 g via ORAL
  Filled 2013-12-15: qty 1

## 2013-12-15 MED ORDER — DOCUSATE SODIUM 100 MG PO CAPS
100.0000 mg | ORAL_CAPSULE | Freq: Two times a day (BID) | ORAL | Status: DC
  Administered 2013-12-15: 100 mg via ORAL
  Filled 2013-12-15: qty 1

## 2013-12-15 MED ORDER — HYDROCODONE-ACETAMINOPHEN 5-325 MG PO TABS: 1.0000 | ORAL_TABLET | Freq: Four times a day (QID) | ORAL | Status: DC | PRN

## 2013-12-15 MED ORDER — POLYETHYLENE GLYCOL 3350 17 G PO PACK: 17.0000 g | PACK | Freq: Two times a day (BID) | ORAL | Status: DC

## 2013-12-15 NOTE — Evaluation (Addendum)
Physical Therapy Evaluation Patient Details Name: [name] MRN: [id number] DOB: 01-02-1941 Today's Date: 12/15/2013 Time: 1829-9371 PT Time Calculation (min): 26 min  PT Assessment / Plan / Recommendation History of Present Illness  Pt was admitted on 12-13-13 with acute respiratory failure.  She has a hx of lung CA and has had a resection of the right lung.  She has a T12 compression fx which is age indeterminate. She stated that last Saturday she tried to move a sofa and threw herself off balance, landing on her back. Her  back began to hurt and she was seen in the ED at that time.  She was sent home with pain med.  Her pain is well controlled with pain med.  She lives independently in a [place] complex.  She is active there and maintains herself on an exercise program.  Clinical Impression   Pt was seen for evaluation.  She is very pleasant and cooperative, no back pain at this time.  She is independent with all mobility and gait is stable with no assistive device.  She was on supplemental O2 at the time of my visit, 2 L/min.  Her O2 sat=94%.  Off of O2, on RA her O2 stabilized at 91%.  However, after gait, her O2 sat dropped to 80%.  The O2 was replaced at 2 L/min and it took about 2 min for the O2 sat to rebound to 92%.  She had no dyspnea during gait.  I recommended that she not try to move any more pieces of furniture by herself.    PT Assessment  Patent does not need any further PT services    Follow Up Recommendations  No PT follow up               Equipment Recommendations  None recommended by PT             Precautions / Restrictions Precautions Precautions: None Restrictions Weight Bearing Restrictions: No         Mobility  Bed Mobility Overal bed mobility: Modified Independent Transfers Overall transfer level: Independent Equipment used: None Ambulation/Gait Ambulation/Gait assistance: Independent Ambulation Distance (Feet): 200  Feet Assistive device: None Gait Pattern/deviations: WFL(Within Functional Limits) Gait velocity interpretation: at or above normal speed for age/gender            Acute Rehab PT Goals PT Goal Formulation: No goals set, d/c therapy  Visit Information  Last PT Received On: 12/15/13 History of Present Illness: Pt was admitted on 12-13-13 with acute bronchitis.  She has a hx of lung CA and has had a resection of the right lung.  She has a T12 compression fx which is age indeterminate....this fx causes intermittent discomfort.  She lives independently in a [place] complex.       Prior [place] expects to be discharged to:: Private residence Living Arrangements: Alone Available Help at Discharge: Friend(s);Available 24 hours/day Type of Home: Apartment Home Access: Level entry Home Layout: One level Home Equipment: None Prior Function Level of Independence: Independent Communication Communication: No difficulties    Cognition  Cognition Arousal/Alertness: Awake/alert Behavior During Therapy: WFL for tasks assessed/performed Overall Cognitive Status: Within Functional Limits for tasks assessed    Extremity/Trunk Assessment Lower Extremity Assessment Lower Extremity Assessment: Overall WFL for tasks assessed   Balance Balance Overall balance assessment: Independent  End of Session PT - End of Session Equipment Utilized During Treatment: Gait belt Activity Tolerance: Patient tolerated treatment  well Patient left: in bed;with call bell/phone within reach  GP Functional Assessment Tool Used: clin ical judgement Functional Limitation: Mobility: Walking and moving around Mobility: Walking and Moving Around Current Status (F9012): 0 percent impaired, limited or restricted Mobility: Walking and Moving Around Goal Status (Q2411): 0 percent impaired, limited or restricted Mobility: Walking and Moving Around Discharge Status [phone]): 0  percent impaired, limited or restricted   Demetrios Isaacs L 12/15/2013, 9:07 AM

## 2013-12-15 NOTE — Discharge Summary (Signed)
Physician Discharge Summary  [name] FYB:[id number] DOB: 1940/11/07 DOA: 12/13/2013  PCP: [name], MD  Admit date: 12/13/2013 Discharge date: 12/15/2013  Time spent: 40 minutes minutes  Recommendations for Outpatient Follow-up:  1. Follow up with PCP 1 week for evaluation of respiratory status and pain management.    Discharge Diagnoses:  Principal Problem:   Acute respiratory failure with hypoxia Active Problems:   Adenocarcinoma of lung   Abdominal aneurysm without mention of rupture   Hypoxia   Diastolic dysfunction   Compression fracture   Constipation   Hyponatremia   COPD (chronic obstructive pulmonary disease)   Compression fracture of thoracic vertebra   Discharge Condition: stable  Diet recommendation: heart healthy carb modified  Filed Weights   12/13/13 1940 12/14/13 0405 12/15/13 5277  Weight: 63.05 kg (139 lb) 61.1 kg (134 lb 11.2 oz) 62.1 kg (136 lb 14.5 oz)    History of present illness:  73 yo female h/o lung cancer s/p resection, copd, diastolic chf presented on 12/13/13 because she was getting some dizziness with activity. Checked her oxygen sats and it was mid 48s. Denied any sob or feveres or cough. No swelling. Pt was seen in ED 12/12/13 dx with compression fracture after a fall and started on narcotics. This was helping her pain, but does make her sedated as she was taking 2 tabs every 6 hours. She was not on oxygen chronically at home, and has never been told she needs it   Hospital Course:  Principal Problem:  Acute respiratory failure with hypoxia- Highly suspect this is due to pain meds recently started and taken every 6 hours. 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 She was admitted and provided with oxygen supplementation and incentive spirometry and narcotics held initially. She was also provided with nebs. No signs of infection and doubt PE.  She quickly  improved. She likely has some underlying chronic lung disease as well.  Continued with hypoxia with ambulation. She will be discharged with home oxygen. She has been instructed to use pain medication judiciously and the dose has been decreased.  Recommend close follow up with PCP for evaluation of respiratory status.   Active Problems:  Compression fracture: T12 compression deformity age indeterminate but new from 11/14. Pain management as above. PT evaluation without HH recommendation. Able to ambulate at discharge. If fails to continue to improve or pain difficult to manage may consider OP follow up with IR or neurosurgery.    Hyponatremia: mild. Likely related to decreased po intake over last few day. Provided with IV fluids.   Diastolic dysfunction: chart review unable to find recent echo. Not on medication at home. Remained stable at discharge.   Adenocarcinoma of lung: s/p resection 2012. Recent follow up 12/14 remains cancer free.   Abdominal aortic aneurysm: stable. Follow up US 6 months.   COPD: not on home oxygen or inhalers. See #1.  No wheezing on exam. Hx of some hypoxia with exertion noted in chart. Will discharge on home oxygen. Recommend close OP follow up to monitor respiratory status.     Procedures: none Consultations:  none  Discharge Exam: Filed Vitals:   12/15/13 0638  BP: 120/76  Pulse: 77  Temp: 97.3 F (36.3 C)  Resp: 20    General: thin NAD Cardiovascular: RR No MGR No LE edema PPP Respiratory: normal effort. BS distant but clear bilaterally no wheeze  Discharge Instructions  Discharge  Orders   Future Appointments Provider Department Dept Phone   03/16/2014 9:30 AM [facility] [place] [facility] [address] [phone]   Eat a light meal the night before the exam. Nothing to eat or drink for at least 8 hours before exam. No gum chewing, or smoking the morning of the exam. Please take your morning medications with small sips of water,  especially blood pressure medication *Very Important* Please wear 2 piece clothing   03/16/2014 10:30 AM [facility] [place] [facility] [address] [phone]   03/16/2014 11:30 AM Rosetta Posner, MD [facility] [phone]   10/28/2014 10:40 AM [place] [phone]   10/28/2014 11:00 AM [facility] [address] [facility] [phone]   Liquids only 4 hours prior to your exam. Any medications can be taken as usual. Please arrive 15 min prior to your scheduled exam time.   11/01/2014 11:00 AM [facility] Covering Provider [place] [phone]   Future Orders Complete By Expires   Diet - low sodium heart healthy  As directed    Discharge instructions  As directed    Comments:     Take pain medicine ONLY as needed.  Take other medications as directed.  Follow up with PCP 1-2 weeks for evaluation of pain management   Increase activity slowly  As directed        Medication List         alendronate 70 MG tablet  Commonly known as:  FOSAMAX  Take 1 tablet (70 mg total) by mouth once a week. Take with a full glass of water on an empty stomach. Take on sundays     calcium citrate-vitamin D 315-200 MG-UNIT per tablet  Commonly known as:  CITRACAL+D  Take 1 tablet by mouth daily.     citalopram 40 MG tablet  Commonly known as:  CELEXA  Take 1 tablet (40 mg total) by mouth at bedtime.     diphenhydrAMINE 25 mg capsule  Commonly known as:  BENADRYL  Take 25 mg by mouth at bedtime as needed for sleep.     DSS 100 MG Caps  Take 100 mg by mouth 2 (two) times daily.     fish oil-omega-3 fatty acids 1000 MG capsule  Take 1 g by mouth daily.     HYDROcodone-acetaminophen 5-325 MG per tablet  Commonly known as:  NORCO/VICODIN  Take 1 tablet by mouth every 6 (six) hours as needed for moderate pain.     ibuprofen 200 MG tablet  Commonly known as:  ADVIL,MOTRIN  Take 400 mg by mouth every 6 (six) hours as needed (for  pain).     levothyroxine 25 MCG tablet  Commonly known as:  SYNTHROID, LEVOTHROID  Take 1 tablet (25 mcg total) by mouth daily before breakfast.     multivitamin with minerals Tabs tablet  Take 1 tablet by mouth daily.     polyethylene glycol packet  Commonly known as:  MIRALAX / GLYCOLAX  Take 17 g by mouth 2 (two) times daily.     senna-docusate 8.6-50 MG per tablet  Commonly known as:  Senokot-S  Take 1 tablet by mouth at bedtime as needed for mild constipation.     Vitamin D-3 5000 UNITS Tabs  Take 5,000 Units by mouth daily.       Allergies  Allergen Reactions  . Ciprofloxacin Hcl Hives  . Iohexol      Desc: Per [facility], pt  is allergic to IV contrast, no type of reaction was available. Pt cannot remember but first reacted around 15 yrs ago from an IVP. Notes were date from 2009 at urology center., [place] Date: [id number]   . Prozac [Fluoxetine Hcl] Rash  . Sulfonamide Derivatives Hives and Rash       Follow-up Information   Follow up with [facility] TOM, MD. Schedule an appointment as soon as possible for a visit in 1 week. (for evaluation of respiratory status and pain managemtne)    Specialty:  Family Medicine   Contact information:   [address] [place] [address] [phone]        The results of significant diagnostics from this hospitalization (including imaging, microbiology, ancillary and laboratory) are listed below for reference.    Significant Diagnostic Studies: Dg Lumbar Spine Complete  12/12/2013   CLINICAL DATA:  Back pain and flank pain  EXAM: LUMBAR SPINE - COMPLETE 4+ VIEW  COMPARISON:  None  FINDINGS: There is a scoliosis deformity involving the lumbar spine. Multi level disc space narrowing and ventral endplate spurring is identified compatible with degenerative disc disease. T12 compression deformity appears new from the previous examination. No retropulsion of fracture fragments identified. There is calcified  atherosclerotic disease involving the abdominal aorta. Aneurysmal dilatation of the aorta measures up to 4.1 cm.  IMPRESSION: 1. Scoliosis and multilevel degenerative disc disease. 2. T12 compression deformity is age indeterminate but is new from 08/19/2013 3. Atherosclerotic disease with abdominal aortic aneurysm. Recommend follow up by [place] in 1year. This recommendation follows ACR consensus guidelines: White Paper of the ACR Incidental Findings Committee II on Vascular Findings. Joellyn Rued [id number]; 03:546-568.   Electronically Signed   By: Kerby Moors M.D.   On: 12/12/2013 11:56   Dg Pelvis 1-2 Views  12/12/2013   CLINICAL DATA:  Hip pain after fall.  EXAM: PELVIS - 1-2 VIEW  COMPARISON:  11/13/2010  FINDINGS: Pelvic bony ring is intact. No gross abnormality to the SI joints. Disc space changes at L4-L5. Sclerosis at the right hip joint and unclear if this is related to the acetabulum or femoral head. No evidence for a gross fracture or dislocation. Mild irregularity along the superior aspect of the right femoral head and neck appears to be degenerative in etiology.  IMPRESSION: No acute bone abnormality to the pelvis.  Nonspecific sclerosis in the right hip. This may be better characterized with a dedicated right hip examination.   Electronically Signed   By: Markus Daft M.D.   On: 12/12/2013 12:00   Dg Chest Port 1 View  12/13/2013   CLINICAL DATA:  Short of breath.  Hypoxia.  EXAM: PORTABLE CHEST - 1 VIEW  COMPARISON:  08/19/2013  FINDINGS: Lungs are hyperexpanded. There is prominence of the bronchovascular markings, but no consolidation or edema. Scarring is noted in the left lung base, stable. No pleural effusion or pneumothorax.  There are surgical vascular clips along the left hilum, stable.  Cardiac silhouette is normal in size. No mediastinal or hilar masses.  Bony thorax is demineralized but intact.  IMPRESSION: No acute cardiopulmonary disease.  COPD.   Electronically Signed   By: Lajean Manes  M.D.   On: 12/13/2013 20:44    Microbiology: Recent Results (from the past 240 hour(s))  URINE CULTURE     Status: None   Collection Time    12/13/13  8:45 PM      Result Value Ref Range Status   Specimen Description  URINE, CATHETERIZED   Final   Special Requests NONE   Final   Culture  Setup Time     Final   Value: 12/13/2013 23:30     Performed at [facility] Count     Final   Value: NO GROWTH     Performed at [facility]   Culture     Final   Value: NO GROWTH     Performed at [facility]   Report Status 12/14/2013 FINAL   Final     Labs: Basic Metabolic Panel:  Recent Labs Lab 12/13/13 2030 12/14/13 0152 12/15/13 0534  NA 136* 136* 136*  K 4.5 3.9 4.4  CL 97 97 98  CO2 30 30 27   GLUCOSE 114* 144* 112*  BUN 13 11 7   CREATININE 0.67 0.59 0.45*  CALCIUM 9.9 9.4 9.4   Liver Function Tests: No results found for this basename: AST, ALT, ALKPHOS, BILITOT, PROT, ALBUMIN,  in the last 168 hours No results found for this basename: LIPASE, AMYLASE,  in the last 168 hours No results found for this basename: AMMONIA,  in the last 168 hours CBC:  Recent Labs Lab 12/13/13 2030 12/14/13 0152  WBC 8.0 8.6  NEUTROABS 6.6  --   HGB 14.2 13.2  HCT 42.2 39.3  MCV 89.8 89.3  PLT 233 221   Cardiac Enzymes:  Recent Labs Lab 12/13/13 2030 12/14/13 0152 12/14/13 0816 12/14/13 1419  TROPONINI <0.30 <0.30 <0.30 <0.30   BNP: BNP (last 3 results) No results found for this basename: PROBNP,  in the last 8760 hours CBG: No results found for this basename: GLUCAP,  in the last 168 hours     Signed:  Radene Gunning  [facility] 12/15/2013, 12:55 PM

## 2013-12-15 NOTE — Discharge Summary (Signed)
Patient seen, independently examined and chart reviewed. I agree with exam, assessment and plan discussed with Ruth Carrel, NP.  Subjective: She feels better. Back pain is much better. Ambulating without difficulty. Was noted to be hypoxic. No paresthesias, no weakness. Does note some constipation, chronic problem.  Objective: Afebrile, vital signs are stable. Stable hypoxia. Cardiovascular regular rate and rhythm no murmur, rub or gallop. Respiratory clear to auscultation bilaterally. No wheezes, rales or rhonchi. Normal respiratory effort. Excellent bilateral lower choice strength.  Basic metabolic panel unremarkable.  Plan discharge home today. In regard to her vertebral fracture, she has minimal pain, ambulates well and at this point is stable for discharge. As previously documented, [name] has subclinical COPD and h/o lung cancer with resection. In past she has had hypoxia (after surgery) and keeps an oximeter at home, she reports O2 sat in 80s with exertion in past. There are no signs or symptoms to suggest acute pulmonary pathology, rather it appears she simply has a component of chronic hypoxia related to her underlying illness. Plan discharge home with home oxygen.  Murray Hodgkins, MD [facility] [phone]

## 2013-12-17 ENCOUNTER — Emergency Department (HOSPITAL_COMMUNITY)
Admission: EM | Admit: 2013-12-17 | Discharge: 2013-12-17 | Disposition: A | Discharge status: Home / Self Care | Payer: Medicare HMO | Attending: Emergency Medicine | Admitting: Emergency Medicine

## 2013-12-17 ENCOUNTER — Emergency Department (HOSPITAL_COMMUNITY): Payer: Medicare HMO

## 2013-12-17 ENCOUNTER — Encounter: Payer: Self-pay | Admitting: Family Medicine

## 2013-12-17 ENCOUNTER — Encounter (HOSPITAL_COMMUNITY): Payer: Self-pay | Admitting: Emergency Medicine

## 2013-12-17 DIAGNOSIS — F329 Major depressive disorder, single episode, unspecified: Secondary | ICD-10-CM | POA: Insufficient documentation

## 2013-12-17 DIAGNOSIS — Z9981 Dependence on supplemental oxygen: Secondary | ICD-10-CM | POA: Insufficient documentation

## 2013-12-17 DIAGNOSIS — E039 Hypothyroidism, unspecified: Secondary | ICD-10-CM | POA: Insufficient documentation

## 2013-12-17 DIAGNOSIS — R252 Cramp and spasm: Secondary | ICD-10-CM | POA: Insufficient documentation

## 2013-12-17 DIAGNOSIS — Z85118 Personal history of other malignant neoplasm of bronchus and lung: Secondary | ICD-10-CM | POA: Insufficient documentation

## 2013-12-17 DIAGNOSIS — Z8719 Personal history of other diseases of the digestive system: Secondary | ICD-10-CM | POA: Insufficient documentation

## 2013-12-17 DIAGNOSIS — J449 Chronic obstructive pulmonary disease, unspecified: Secondary | ICD-10-CM | POA: Insufficient documentation

## 2013-12-17 DIAGNOSIS — Z79899 Other long term (current) drug therapy: Secondary | ICD-10-CM | POA: Insufficient documentation

## 2013-12-17 DIAGNOSIS — Z8679 Personal history of other diseases of the circulatory system: Secondary | ICD-10-CM | POA: Insufficient documentation

## 2013-12-17 DIAGNOSIS — Z8781 Personal history of (healed) traumatic fracture: Secondary | ICD-10-CM | POA: Insufficient documentation

## 2013-12-17 DIAGNOSIS — J4489 Other specified chronic obstructive pulmonary disease: Secondary | ICD-10-CM | POA: Insufficient documentation

## 2013-12-17 DIAGNOSIS — Z87891 Personal history of nicotine dependence: Secondary | ICD-10-CM | POA: Insufficient documentation

## 2013-12-17 DIAGNOSIS — F411 Generalized anxiety disorder: Secondary | ICD-10-CM | POA: Insufficient documentation

## 2013-12-17 DIAGNOSIS — Z8711 Personal history of peptic ulcer disease: Secondary | ICD-10-CM | POA: Insufficient documentation

## 2013-12-17 DIAGNOSIS — F3289 Other specified depressive episodes: Secondary | ICD-10-CM | POA: Insufficient documentation

## 2013-12-17 HISTORY — DX: Dependence on supplemental oxygen: Z99.81

## 2013-12-17 HISTORY — DX: Reserved for concepts with insufficient information to code with codable children: IMO0002

## 2013-12-17 LAB — CBC WITH DIFFERENTIAL/PLATELET
Basophils Absolute: 0 10*3/uL (ref 0.0–0.1)
Basophils Relative: 0 % (ref 0–1)
Eosinophils Absolute: 0.1 10*3/uL (ref 0.0–0.7)
Eosinophils Relative: 1 % (ref 0–5)
HCT: 41.1 % (ref 36.0–46.0)
Hemoglobin: 13.9 g/dL (ref 12.0–15.0)
Lymphocytes Relative: 12 % (ref 12–46)
Lymphs Abs: 0.9 10*3/uL (ref 0.7–4.0)
MCH: 30.4 pg (ref 26.0–34.0)
MCHC: 33.8 g/dL (ref 30.0–36.0)
MCV: 89.9 fL (ref 78.0–100.0)
Monocytes Absolute: 0.5 10*3/uL (ref 0.1–1.0)
Monocytes Relative: 7 % (ref 3–12)
Neutro Abs: 5.9 10*3/uL (ref 1.7–7.7)
Neutrophils Relative %: 80 % — ABNORMAL HIGH (ref 43–77)
Platelets: 276 10*3/uL (ref 150–400)
RBC: 4.57 MIL/uL (ref 3.87–5.11)
RDW: 13.2 % (ref 11.5–15.5)
WBC: 7.3 10*3/uL (ref 4.0–10.5)

## 2013-12-17 LAB — COMPREHENSIVE METABOLIC PANEL
ALT: 13 U/L (ref 0–35)
AST: 18 U/L (ref 0–37)
Albumin: 4 g/dL (ref 3.5–5.2)
Alkaline Phosphatase: 70 U/L (ref 39–117)
BUN: 19 mg/dL (ref 6–23)
CO2: 30 mEq/L (ref 19–32)
Calcium: 10 mg/dL (ref 8.4–10.5)
Chloride: 94 mEq/L — ABNORMAL LOW (ref 96–112)
Creatinine, Ser: 0.52 mg/dL (ref 0.50–1.10)
GFR calc Af Amer: >90 mL/min (ref >90)
GFR calc non Af Amer: >90 mL/min (ref >90)
Glucose, Bld: 101 mg/dL — ABNORMAL HIGH (ref 70–99)
Potassium: 4.9 mEq/L (ref 3.7–5.3)
Sodium: 136 mEq/L — ABNORMAL LOW (ref 137–147)
Total Bilirubin: 0.4 mg/dL (ref 0.3–1.2)
Total Protein: 8 g/dL (ref 6.0–8.3)

## 2013-12-17 LAB — URINALYSIS, ROUTINE W REFLEX MICROSCOPIC
Bilirubin Urine: NEGATIVE
Glucose, UA: NEGATIVE mg/dL
Hgb urine dipstick: NEGATIVE
Ketones, ur: 15 mg/dL — AB
Leukocytes, UA: NEGATIVE
Nitrite: NEGATIVE
Protein, ur: NEGATIVE mg/dL
Specific Gravity, Urine: 1.02 (ref 1.005–1.030)
Urobilinogen, UA: 0.2 mg/dL (ref 0.0–1.0)
pH: 6 (ref 5.0–8.0)

## 2013-12-17 LAB — CK: Total CK: 82 U/L (ref 7–177)

## 2013-12-17 LAB — TROPONIN I: Troponin I: <0.3 ng/mL (ref <0.30)

## 2013-12-17 MED ORDER — TECHNETIUM TC 99M DIETHYLENETRIAME-PENTAACETIC ACID
40.0000 | Freq: Once | INTRAVENOUS | Status: AC | PRN
  Administered 2013-12-17: 40 via INTRAVENOUS

## 2013-12-17 MED ORDER — TECHNETIUM TO 99M ALBUMIN AGGREGATED
6.0000 | Freq: Once | INTRAVENOUS | Status: AC | PRN
  Administered 2013-12-17: 6 via INTRAVENOUS

## 2013-12-17 MED ORDER — HYDROCODONE-ACETAMINOPHEN 5-325 MG PO TABS
2.0000 | ORAL_TABLET | Freq: Once | ORAL | Status: AC
  Administered 2013-12-17: 2 via ORAL
  Filled 2013-12-17: qty 2

## 2013-12-17 NOTE — ED Provider Notes (Signed)
CSN: [id number]     Arrival date & time 12/17/13  1230 History   First MD Initiated Contact with Patient 12/17/13 1509     Chief Complaint  Patient presents with  . Back Pain     (Consider location/radiation/quality/duration/timing/severity/associated sxs/prior Treatment) HPI Comments: Patient presents from home with bilateral lower extremity spasms worsening for the past 2 days. She was admitted to the hospital and discharged 2 days ago for a thoracic compression fracture and found to be hypoxic and discharge on home oxygen. She reports intermittent cramping in her bilateral thighs lasting a few seconds at a time it comes and goes. Today she called EMS because she couldn't get up the pain was so bad. His been hesitant to take her pain medication because she thinks it makes her confused and sleepy. She denies any back pain currently. She denies any focal weakness, numbness or tingling. No chest pain or shortness of breath. She feels her breathing is at baseline. She was hypoxic in triage but was not wearing her oxygen. She denies any new falls.  The history is provided by the patient.    Past Medical History  Diagnosis Date  . COPD (chronic obstructive pulmonary disease)   . Anxiety disorder   . Hypothyroidism   . PUD (peptic ulcer disease)     27yrs  . Dysphagia   . IBS (irritable bowel syndrome)   . Diverticulosis   . Trigeminal neuralgia   . Diastolic dysfunction   . Depression   . Allergic rhinitis   . 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 Hiatal hernia   . Aortic aneurysm   . Complication of anesthesia 2011    bloodpressure dropped during colonoscopy, not problems since  . Shortness of breath     with exertion  . Aneurysm of carotid artery     corrected by surgery 08/21/13  . Compression fracture   . On home O2 12/15/2013    chronic hypoxia   Past Surgical History  Procedure Laterality Date  . Colonoscopy  2011    hyperplastic polyp, 3-4 small cecal  AVMs, nonbleeding  . Esophagogastroduodenoscopy  11/13/2009    w/dilation to 39mm, gastric ulceration (H.Pylori) s/p treatment  . Esophagogastroduodenoscopy  11/21/2009    distal esophageal web, gastritis  . Lung cancer surgery  12/2010    Left VATS, minithoracotomy, LLL superior segmentectomy  . Vocal cord surgery  02/06/2011    laryngoscopy with bilateral vocal cord Radiesse injection for vocal cord paralysis  . Abdominal hysterectomy    . Cholecystectomy    . Tubal ligation    . Cataract extraction    . Fatty tumor removal from lt groin    . Cataract extraction w/phaco  09/02/2012    Procedure: CATARACT EXTRACTION PHACO AND INTRAOCULAR LENS PLACEMENT (IOC);  Surgeon: Elta Guadeloupe T. Gershon Crane, MD;  Location: [facility];  Service: Ophthalmology;  Laterality: Left;  CDE:10.35  . Endarterectomy Right 08/21/2013    Procedure: RIGHT CAROTID ANEURYSM RESECTION;  Surgeon: Rosetta Posner, MD;  Location: [facility] OR;  Service: Vascular;  Laterality: Right;   Family History  Problem Relation Age of Onset  . Liver cancer Sister   . Cancer Sister   . Pulmonary embolism Mother   . Heart attack Father   . Hypertension Father   . Cancer Brother   . Cancer Daughter    History  Substance Use Topics  . Smoking status: Former Smoker -- 0.50 packs/day for 20 years  Types: Cigarettes    Quit date: 12/21/2010  . Smokeless tobacco: Former Systems developer     Comment: smoking cessation info given and reviewed   . Alcohol Use: Yes     Comment: seldom   OB History   Grav Para Term Preterm Abortions TAB SAB Ect Mult Living   2 2 2       2      Review of Systems  Constitutional: Positive for activity change. Negative for fever and appetite change.  HENT: Negative for congestion and rhinorrhea.   Respiratory: Negative for cough, chest tightness and shortness of breath.   Cardiovascular: Negative for chest pain.  Gastrointestinal: Negative for nausea, vomiting and abdominal pain.  Genitourinary: Negative for dysuria, hematuria,  vaginal bleeding and vaginal discharge.  Musculoskeletal: Positive for back pain. Negative for arthralgias and myalgias.  Skin: Negative for rash.  Neurological: Positive for weakness. Negative for dizziness and headaches.  A complete 10 system review of systems was obtained and all systems are negative except as noted in the HPI and PMH.      Allergies  Ciprofloxacin hcl; Iohexol; Prozac; and Sulfonamide derivatives  Home Medications   Current Outpatient Rx  Name  Route  Sig  Dispense  Refill  . alendronate (FOSAMAX) 70 MG tablet   Oral   Take 1 tablet (70 mg total) by mouth once a week. Take with a full glass of water on an empty stomach. Take on sundays   12 tablet   4   . calcium citrate-vitamin D (CITRACAL+D) 315-200 MG-UNIT per tablet   Oral   Take 1 tablet by mouth daily.          . Cholecalciferol (VITAMIN D-3) 5000 UNITS TABS   Oral   Take 5,000 Units by mouth daily.          . citalopram (CELEXA) 40 MG tablet   Oral   Take 1 tablet (40 mg total) by mouth at bedtime.   90 tablet   4   . diphenhydrAMINE (BENADRYL) 25 mg capsule   Oral   Take 25 mg by mouth at bedtime as needed for sleep.         Marland Kitchen docusate sodium 100 MG CAPS   Oral   Take 100 mg by mouth 2 (two) times daily.   10 capsule   0   . fish oil-omega-3 fatty acids 1000 MG capsule   Oral   Take 1 g by mouth daily.          Marland Kitchen HYDROcodone-acetaminophen (NORCO/VICODIN) 5-325 MG per tablet   Oral   Take 1 tablet by mouth every 6 (six) hours as needed for moderate pain.   30 tablet   0   . levothyroxine (SYNTHROID, LEVOTHROID) 25 MCG tablet   Oral   Take 1 tablet (25 mcg total) by mouth daily before breakfast.   90 tablet   3   . Multiple Vitamin (MULTIVITAMIN WITH MINERALS) TABS   Oral   Take 1 tablet by mouth daily.         . polyethylene glycol (MIRALAX / GLYCOLAX) packet   Oral   Take 17 g by mouth 2 (two) times daily.   14 each   0   . senna-docusate (SENOKOT-S) 8.6-50  MG per tablet   Oral   Take 1 tablet by mouth at bedtime as needed for mild constipation.         Marland Kitchen ibuprofen (ADVIL,MOTRIN) 200 MG tablet   Oral   Take 400  mg by mouth every 6 (six) hours as needed (for pain).          BP 113/60  Pulse 86  Temp(Src) 98.3 F (36.8 C) (Oral)  Resp 18  Ht 5\' 6"  (1.676 m)  Wt 134 lb (60.782 kg)  BMI 21.64 kg/m2  SpO2 91% Physical Exam  Constitutional: She is oriented to person, place, and time. She appears well-developed and well-nourished. No distress.  HENT:  Head: Normocephalic and atraumatic.  Mouth/Throat: Oropharynx is clear and moist. No oropharyngeal exudate.  Eyes: Conjunctivae and EOM are normal. Pupils are equal, round, and reactive to light.  Neck: Normal range of motion. Neck supple.  Cardiovascular: Normal rate, regular rhythm and normal heart sounds.   Pulmonary/Chest: Effort normal and breath sounds normal. No respiratory distress.  Abdominal: Soft. There is no tenderness. There is no rebound and no guarding.  Musculoskeletal: Normal range of motion. She exhibits no edema and no tenderness.  No midline T or L tenderness. 5/5 strength in bilateral lower extremities. Ankle plantar and dorsiflexion intact. Great toe extension intact bilaterally. +2 DP and PT pulses. +2 patellar reflexes bilaterally. Normal gait.   Neurological: She is alert and oriented to person, place, and time. No cranial nerve deficit. She exhibits normal muscle tone. Coordination normal.  Skin: Skin is warm.    ED Course  Procedures (including critical care time) Labs Review Labs Reviewed  CBC WITH DIFFERENTIAL - Abnormal; Notable for the following:    Neutrophils Relative % 80 (*)    All other components within normal limits  COMPREHENSIVE METABOLIC PANEL - Abnormal; Notable for the following:    Sodium 136 (*)    Chloride 94 (*)    Glucose, Bld 101 (*)    All other components within normal limits  URINALYSIS, ROUTINE W REFLEX MICROSCOPIC -  Abnormal; Notable for the following:    Ketones, ur 15 (*)    All other components within normal limits  TROPONIN I  CK   Imaging Review Dg Chest 1 View  12/17/2013   CLINICAL DATA:  Back pain weakness shortness of breath  EXAM: CHEST - 1 VIEW  COMPARISON:  DG CHEST 1V PORT dated 12/13/2013 Mild cardiac enlargement stable.  FINDINGS: Mild background interstitial change stable. No consolidation or effusion.  IMPRESSION: No acute findings.  Mild chronic interstitial lung disease.   Electronically Signed   By: Skipper Cliche M.D.   On: 12/17/2013 17:03   Nm Pulmonary Perf And Vent  12/17/2013   CLINICAL DATA:  Hypoxia.  History of lung carcinoma.  EXAM: NUCLEAR MEDICINE VENTILATION - PERFUSION LUNG SCAN  TECHNIQUE: Ventilation images were obtained in multiple projections using inhaled aerosol technetium 99 M DTPA. Perfusion images were obtained in multiple projections after intravenous injection of Tc-11m MAA.  RADIOPHARMACEUTICALS:  40 MCi Tc-62m DTPA aerosol and 6 mCi Tc-28m MAA  COMPARISON:  Chest radiograph 12/17/2013  FINDINGS: Ventilation: There is patchy lung uptake has fairly diffuse and symmetric bilaterally. No discrete or dominant ventilatory defect is seen.  Perfusion: There is mild patchy accumulation radiotracer the lungs, with small, non segmental defects, less prominent than the defects seen on ventilation.  IMPRESSION: 1. Low probability ventilation perfusion study for pulmonary thromboembolism. 2. There are patchy areas of relative decreased profusion with smaller patchy areas of bilateral decreased ambulation. This suggests small airways disease.   Electronically Signed   By: Lajean Manes M.D.   On: 12/17/2013 17:54   US Venous Img Lower Bilateral  12/17/2013   CLINICAL  DATA:  Lower extremity pain  EXAM: BILATERAL LOWER EXTREMITY VENOUS DOPPLER ULTRASOUND  TECHNIQUE: Gray-scale sonography with graded compression, as well as color Doppler and duplex ultrasound, were performed to evaluate  the deep venous system from the level of the common femoral vein through the popliteal and proximal calf veins. Spectral Doppler was utilized to evaluate flow at rest and with distal augmentation maneuvers.  COMPARISON:  None.  FINDINGS: Thrombus within deep veins:  None visualized.  Compressibility of deep veins:  Normal.  Duplex waveform respiratory phasicity:  Normal.  Duplex waveform response to augmentation:  Normal.  Venous reflux:  None visualized.  Other findings:  None visualized.  IMPRESSION: Negative for DVT   Electronically Signed   By: Daryll Brod M.D.   On: 12/17/2013 16:38     EKG Interpretation None      MDM   Final diagnoses:  Leg cramps   Lower extremity cramping for the past 2 days. Recent diagnosis of lumbar compression fracture. No focal weakness on exam  Electrolytes normal. Dopplers negative for DVT. Distal pulses intact.  Normal strength and sensation.  CK normal. VQ scan low probability for PE. Patient stable on home oxygen without hypoxia.  She denies any leg cramps currently. She is able to ambulate. There is no evidence of thrombosis or arterial insufficiency. No evidence of cauda equina or cord compression. She is encouraged to take her pain medication she has at home. She has a history of ulcers so she is unable to tolerate anti-inflammatories. Hesitant to prescribed muscle relaxers given her age.  No cramps currently, able to ambulate. Increase fluid intake, follow up with PCP.  BP 113/60  Pulse 86  Temp(Src) 98.3 F (36.8 C) (Oral)  Resp 18  Ht 5\' 6"  (1.676 m)  Wt 134 lb (60.782 kg)  BMI 21.64 kg/m2  SpO2 91%    Ezequiel Essex, MD 12/17/13 2035

## 2013-12-17 NOTE — Discharge Instructions (Signed)
Leg Cramps Increase your fluid intake and take your pain medication as prescribed. Follow up with Dr. Dennard Schaumann. Return to the ED if you develop new or worsening symptoms. Leg cramps that occur during exercise can be caused by poor circulation or dehydration. However, muscle cramps that occur at rest or during the night are usually not due to any serious medical problem. Heat cramps may cause muscle spasms during hot weather.  CAUSES There is no clear cause for muscle cramps. However, dehydration may be a factor for those who do not drink enough fluids and those who exercise in the heat. Imbalances in the level of sodium, potassium, calcium or magnesium in the muscle tissue may also be a factor. Some medications, such as water pills (diuretics), may cause loss of chemicals that the body needs (like sodium and potassium) and cause muscle cramps. TREATMENT   Make sure your diet has enough fluids and essential minerals for the muscle to work normally.  Avoid strenuous exercise for several days if you have been having frequent leg cramps.  Stretch and massage the cramped muscle for several minutes.  Some medicines may be helpful in some patients with night cramps. Only take over-the-counter or prescription medicines as directed by your caregiver. SEEK IMMEDIATE MEDICAL CARE IF:   Your leg cramps become worse.  Your foot becomes cold, numb, or blue. Document Released: 11/01/2004 Document Revised: 12/17/2011 Document Reviewed: 10/19/2008 [facility] Patient Information 2014 [place].

## 2013-12-17 NOTE — ED Notes (Addendum)
Patient brought in via EMS from home. Alert and oriented. Airway patent. Patient c/o lower back pain and spasms of upper legs. Per patient was here on 12/12/2013 after fall in which she had compression fractures and was admitted. Per patient was discharged on Tuesday. Patient reports being unable to get up to go to bathroom today due to pain. Patient 02 sat 85% on room air. Per patient was sent home with oxygen when discharged Tuesday. Denies any shortness of breath. Patient placed on 2 liters of oxygen via [place] in triage.

## 2013-12-18 ENCOUNTER — Telehealth: Payer: Self-pay | Admitting: Family Medicine

## 2013-12-18 MED ORDER — CYCLOBENZAPRINE HCL 10 MG PO TABS: 10.0000 mg | ORAL_TABLET | Freq: Three times a day (TID) | ORAL | Status: DC | PRN

## 2013-12-18 NOTE — Telephone Encounter (Signed)
Med sent to pharm and pt aware per vm. Pt has an appt 12/22/13

## 2013-12-18 NOTE — Telephone Encounter (Signed)
Sure flexeril 10 mg poq 8 hrs prn pain. If no better, needs to be seen

## 2013-12-18 NOTE — Telephone Encounter (Signed)
Pt was seen in er yesterday for severe leg pain and cramping. They did dopplers of her legs and she has no blockage or clots and they released her to home. She is still having a lot of leg pain/cramps and wants to know if we can call her in a muscle relaxer to see if that will help? She was very tearful on the phone.

## 2013-12-21 ENCOUNTER — Ambulatory Visit (INDEPENDENT_AMBULATORY_CARE_PROVIDER_SITE_OTHER): Payer: Medicare HMO | Admitting: Family Medicine

## 2013-12-21 ENCOUNTER — Encounter: Payer: Self-pay | Admitting: Family Medicine

## 2013-12-21 VITALS — BP 100/68 | HR 98 | Temp 97.6°F | Resp 16

## 2013-12-21 DIAGNOSIS — M545 Low back pain, unspecified: Secondary | ICD-10-CM

## 2013-12-21 DIAGNOSIS — M5430 Sciatica, unspecified side: Secondary | ICD-10-CM

## 2013-12-21 MED ORDER — PREDNISONE 20 MG PO TABS: ORAL_TABLET | ORAL | Status: DC

## 2013-12-21 MED ORDER — DIAZEPAM 10 MG PO TABS: 10.0000 mg | ORAL_TABLET | Freq: Three times a day (TID) | ORAL | Status: DC | PRN

## 2013-12-21 NOTE — Progress Notes (Signed)
Subjective:    Patient ID: [name], female    DOB: 1941-08-26, 73 y.o.   MRN: [id number]  HPI Patient fell March 7 and move a couch. She sustained a T12 compression fracture. However she does complain of severe pain in her lower lumbar spine that is radiating down both legs. She is complaining of pain in the posterior and anterior portion of both legs with standing. She denies any saddle anesthesia. She denies any leg weakness or numbness. She does complain of radiating pain that shoots from her hip to her feet. This occurs in both legs. She was tried hydrocodone, Flexeril, without relief. She denies any neurogenic claudication. I have reviewed the x-rays of her pelvis and her lumbar spine films from the hospital. Past Medical History  Diagnosis Date  . COPD (chronic obstructive pulmonary disease)   . Anxiety disorder   . Hypothyroidism   . PUD (peptic ulcer disease)     53yrs  . Dysphagia   . IBS (irritable bowel syndrome)   . Diverticulosis   . Trigeminal neuralgia   . Diastolic dysfunction   . Depression   . Allergic rhinitis   . 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 Hiatal hernia   . Aortic aneurysm   . Complication of anesthesia 2011    bloodpressure dropped during colonoscopy, not problems since  . Shortness of breath     with exertion  . Aneurysm of carotid artery     corrected by surgery 08/21/13  . Compression fracture   . On home O2 12/15/2013    chronic hypoxia   Current Outpatient Prescriptions on File Prior to Visit  Medication Sig Dispense Refill  . alendronate (FOSAMAX) 70 MG tablet Take 1 tablet (70 mg total) by mouth once a week. Take with a full glass of water on an empty stomach. Take on sundays  12 tablet  4  . calcium citrate-vitamin D (CITRACAL+D) 315-200 MG-UNIT per tablet Take 1 tablet by mouth daily.       . Cholecalciferol (VITAMIN D-3) 5000 UNITS TABS Take 5,000 Units by mouth daily.       . citalopram (CELEXA) 40 MG tablet  Take 1 tablet (40 mg total) by mouth at bedtime.  90 tablet  4  . cyclobenzaprine (FLEXERIL) 10 MG tablet Take 1 tablet (10 mg total) by mouth every 8 (eight) hours as needed for muscle spasms.  30 tablet  0  . diphenhydrAMINE (BENADRYL) 25 mg capsule Take 25 mg by mouth at bedtime as needed for sleep.      Marland Kitchen docusate sodium 100 MG CAPS Take 100 mg by mouth 2 (two) times daily.  10 capsule  0  . fish oil-omega-3 fatty acids 1000 MG capsule Take 1 g by mouth daily.       Marland Kitchen HYDROcodone-acetaminophen (NORCO/VICODIN) 5-325 MG per tablet Take 1 tablet by mouth every 6 (six) hours as needed for moderate pain.  30 tablet  0  . ibuprofen (ADVIL,MOTRIN) 200 MG tablet Take 400 mg by mouth every 6 (six) hours as needed (for pain).      Marland Kitchen levothyroxine (SYNTHROID, LEVOTHROID) 25 MCG tablet Take 1 tablet (25 mcg total) by mouth daily before breakfast.  90 tablet  3  . Multiple Vitamin (MULTIVITAMIN WITH MINERALS) TABS Take 1 tablet by mouth daily.      . polyethylene glycol (MIRALAX / GLYCOLAX) packet Take 17 g by mouth 2 (two) times daily.  14 each  0  .  senna-docusate (SENOKOT-S) 8.6-50 MG per tablet Take 1 tablet by mouth at bedtime as needed for mild constipation.       No current facility-administered medications on file prior to visit.   Allergies  Allergen Reactions  . Ciprofloxacin Hcl Hives  . Iohexol      Desc: Per [facility], pt is allergic to IV contrast, no type of reaction was available. Pt cannot remember but first reacted around 15 yrs ago from an IVP. Notes were date from 2009 at urology center., Onset Date: [id number]   . Prozac [Fluoxetine Hcl] Rash  . Sulfonamide Derivatives Hives and Rash   History   Social History  . Marital Status: Divorced    Spouse Name: N/A    Number of Children: N/A  . Years of Education: N/A   Occupational History  . Not on file.   Social History Main Topics  . Smoking status: Former Smoker -- 0.50 packs/day for 20 years    Types: Cigarettes     Quit date: 12/21/2010  . Smokeless tobacco: Former Systems developer     Comment: smoking cessation info given and reviewed   . Alcohol Use: Yes     Comment: seldom  . Drug Use: No  . Sexual Activity: Yes    Birth Control/ Protection: Surgical   Other Topics Concern  . Not on file   Social History Narrative  . No narrative on file      Review of Systems  All other systems reviewed and are negative.       Objective:   Physical Exam  Vitals reviewed. Constitutional: She is oriented to person, place, and time.  Cardiovascular: Normal rate, regular rhythm and normal heart sounds.   Pulmonary/Chest: Effort normal and breath sounds normal. No respiratory distress. She has no wheezes. She has no rales.  Musculoskeletal:       Thoracic back: She exhibits decreased range of motion and tenderness.       Lumbar back: She exhibits decreased range of motion, tenderness, pain and spasm. She exhibits no bony tenderness.  Neurological: She is alert and oriented to person, place, and time. She has normal reflexes. She displays normal reflexes. No cranial nerve deficit. She exhibits normal muscle tone. Coordination normal.          Assessment & Plan:  Sciatica - Plan: predniSONE (DELTASONE) 20 MG tablet, diazepam (VALIUM) 10 MG tablet  Low back pain  I am concerned the patient may have suffered a slipped disc around the level of L4-L5 causing radiating pain into her legs. I instructed patient on a prednisone taper pack. I have given her Valium 5-10 mg every 8 hours as needed for muscle spasms. She is to recheck with me later this week if her symptoms continue to worsen or recommend an MRI of the lumbar spine

## 2013-12-22 ENCOUNTER — Inpatient Hospital Stay: Payer: Medicare HMO | Admitting: Family Medicine

## 2013-12-24 ENCOUNTER — Telehealth: Payer: Self-pay | Admitting: Family Medicine

## 2013-12-24 ENCOUNTER — Encounter: Payer: Self-pay | Admitting: Family Medicine

## 2013-12-24 DIAGNOSIS — M545 Low back pain, unspecified: Secondary | ICD-10-CM

## 2013-12-24 NOTE — Telephone Encounter (Signed)
Please schedule MRI of l-spine.

## 2013-12-24 NOTE — Telephone Encounter (Signed)
The leg spams are doing better but, cant get up without a walker. Should they get a MRI? And she she thinks they should get one. Has more pain in leg since spasm stopped  Call [phone]

## 2013-12-24 NOTE — Telephone Encounter (Signed)
MRI ordered and pt aware

## 2013-12-25 ENCOUNTER — Telehealth: Payer: Self-pay | Admitting: Family Medicine

## 2013-12-25 NOTE — Telephone Encounter (Signed)
ok 

## 2013-12-25 NOTE — Telephone Encounter (Signed)
Call back number is [phone] Pt is needing a refill on her pain medication and she is not sure of the name of it.

## 2013-12-25 NOTE — Telephone Encounter (Signed)
?   OK to Refill  

## 2013-12-26 ENCOUNTER — Other Ambulatory Visit: Payer: Self-pay | Admitting: Family Medicine

## 2013-12-28 ENCOUNTER — Ambulatory Visit (HOSPITAL_COMMUNITY)
Admission: RE | Admit: 2013-12-28 | Discharge: 2013-12-28 | Disposition: A | Discharge status: Home / Self Care | Payer: Medicare HMO | Source: Ambulatory Visit | Attending: Family Medicine | Admitting: Family Medicine

## 2013-12-28 DIAGNOSIS — W19XXXA Unspecified fall, initial encounter: Secondary | ICD-10-CM | POA: Insufficient documentation

## 2013-12-28 DIAGNOSIS — I719 Aortic aneurysm of unspecified site, without rupture: Secondary | ICD-10-CM | POA: Insufficient documentation

## 2013-12-28 DIAGNOSIS — M48061 Spinal stenosis, lumbar region without neurogenic claudication: Secondary | ICD-10-CM | POA: Insufficient documentation

## 2013-12-28 DIAGNOSIS — M545 Low back pain, unspecified: Secondary | ICD-10-CM | POA: Insufficient documentation

## 2013-12-28 DIAGNOSIS — S32009A Unspecified fracture of unspecified lumbar vertebra, initial encounter for closed fracture: Secondary | ICD-10-CM | POA: Insufficient documentation

## 2013-12-28 DIAGNOSIS — M5126 Other intervertebral disc displacement, lumbar region: Secondary | ICD-10-CM | POA: Insufficient documentation

## 2013-12-28 DIAGNOSIS — M439 Deforming dorsopathy, unspecified: Secondary | ICD-10-CM | POA: Insufficient documentation

## 2013-12-28 MED ORDER — HYDROCODONE-ACETAMINOPHEN 5-325 MG PO TABS: 1.0000 | ORAL_TABLET | Freq: Four times a day (QID) | ORAL | Status: DC | PRN

## 2013-12-28 MED ORDER — CYCLOBENZAPRINE HCL 10 MG PO TABS
10.0000 mg | ORAL_TABLET | Freq: Three times a day (TID) | ORAL | Status: DC | PRN
Start: 2013-12-28 — End: 2014-05-31

## 2013-12-28 NOTE — Telephone Encounter (Signed)
Meds refilled.

## 2013-12-29 ENCOUNTER — Ambulatory Visit (HOSPITAL_COMMUNITY): Payer: Medicare HMO

## 2013-12-29 ENCOUNTER — Telehealth: Payer: Self-pay | Admitting: Family Medicine

## 2013-12-29 DIAGNOSIS — M545 Low back pain, unspecified: Secondary | ICD-10-CM

## 2013-12-29 NOTE — Telephone Encounter (Signed)
I received a call from radiology at [facility].  The results are dictated as follows: T11 12: 7 mm retropulsed bony fragments result in moderate canal  stenosis, conus medullaris deformity. Lateral recess effacement may  affect the traversing L1 nerves. Mild neural foraminal narrowing.  T12-L1: No disc bulge. Mild facet arthropathy without canal stenosis  or neural foraminal narrowing. T2 bright facet effusions are likely  reactive.  L1-2: Annular bulging, mild facet arthropathy without canal stenosis  or neural foraminal narrowing.  L2-3: 4 mm broad-based disc bulge asymmetric to the left. Minimal  annular fissure. Mild facet arthropathy and ligamentum flavum  redundancy without canal stenosis though slight left lateral recess  effacement may affect the traversing left L3 nerve. No neural  foraminal narrowing.  L3-4: 3 mm broad-based disc bulge, moderate facet arthropathy and  ligamentum flavum redundancy. Mild canal stenosis including partial  effacement of the right lateral recess which may affect the  traversing right L4 nerve. Moderate right, minimal left neural  foraminal narrowing.  L4-5: 2 mm broad-based disc bulge with superimposed moderate right  subarticular to extra foraminal broad-based disc protrusion which  appears to encroach upon the exited right L4 nerve. Moderate to  severe right, mild to moderate left facet arthropathy with  ligamentum flavum redundancy. No canal stenosis. Moderate to severe  right, mild left neural foraminal narrowing.  L5-S1: Approximate 3 x 3 mm left central disc extrusion with 3 mm of  subligamentous superior extent, this may reflect free fragment,  sagittal 10/15. Disc material appears to contact the traversing left  S1 nerve. No canal stenosis. Moderate facet arthropathy. Mild right  neural foraminal narrowing.  IMPRESSION:  Acute to subacute severe L1 burst fracture with 7 mm retropulsed  bony fragments resulting in moderate canal stenosis  and conus  medullaris deformity without convincing evidence of spinal cord  edema.   I contacted the patient immediately.  Patient states that she is having pain around the level of L5 in her lower back. She is also having burning pain radiating down her right leg into her right foot. Due to the pain she is having to walk using a walker.  She denies any pain in the left leg. She denies any weakness in the left leg. Patient states that she is able to move her right leg without difficulty. She denies any weakness in the right leg at the present time. She denies any saddle anesthesia. She denies any bowel or bladder incontinence. She denies any symptoms of cauda equina syndrome.  I explained the situation to the patient. I explained that if she begins to develop any symptoms of cauda equina syndrome including weakness in the legs, saddle anesthesia, bowel or bladder incontinence she is to go immediately to the emergency room. Otherwise I am going to schedule her for a neurosurgical consultation as an outpatient as soon as possible.  At the present time the patient does not have any symptoms that require ER evaluation for emergent neurosurgery.  Patient understands the situation and agrees go to the emergency room if symptoms worsen or she develops symptoms consistent with cauda equina syndrome.  We will schedule a neurosurgical consultation as soon as possible as an outpatient.

## 2013-12-30 ENCOUNTER — Encounter: Payer: Self-pay | Admitting: Family Medicine

## 2013-12-31 DIAGNOSIS — S22001A Stable burst fracture of unspecified thoracic vertebra, initial encounter for closed fracture: Secondary | ICD-10-CM | POA: Insufficient documentation

## 2014-01-01 ENCOUNTER — Other Ambulatory Visit: Payer: Self-pay | Admitting: Neurosurgery

## 2014-01-05 ENCOUNTER — Other Ambulatory Visit: Payer: Self-pay | Admitting: Family Medicine

## 2014-01-06 MED ORDER — HYDROCODONE-ACETAMINOPHEN 5-325 MG PO TABS: 1.0000 | ORAL_TABLET | Freq: Four times a day (QID) | ORAL | Status: DC | PRN

## 2014-01-06 NOTE — Telephone Encounter (Signed)
RX printed, left up front and patient aware to pick up via mychart 

## 2014-01-07 ENCOUNTER — Encounter (HOSPITAL_COMMUNITY)
Admission: RE | Admit: 2014-01-07 | Discharge: 2014-01-07 | Disposition: A | Discharge status: Home / Self Care | Payer: Medicare HMO | Source: Ambulatory Visit | Attending: Neurosurgery | Admitting: Neurosurgery

## 2014-01-07 ENCOUNTER — Encounter (HOSPITAL_COMMUNITY): Payer: Self-pay

## 2014-01-07 ENCOUNTER — Other Ambulatory Visit (HOSPITAL_COMMUNITY): Payer: Self-pay | Admitting: *Deleted

## 2014-01-07 DIAGNOSIS — Z01812 Encounter for preprocedural laboratory examination: Secondary | ICD-10-CM | POA: Insufficient documentation

## 2014-01-07 LAB — SURGICAL PCR SCREEN
MRSA, PCR: NEGATIVE
Staphylococcus aureus: NEGATIVE

## 2014-01-07 LAB — CBC
HCT: 40.1 % (ref 36.0–46.0)
Hemoglobin: 13.7 g/dL (ref 12.0–15.0)
MCH: 30.4 pg (ref 26.0–34.0)
MCHC: 34.2 g/dL (ref 30.0–36.0)
MCV: 89.1 fL (ref 78.0–100.0)
Platelets: 327 10*3/uL (ref 150–400)
RBC: 4.5 MIL/uL (ref 3.87–5.11)
RDW: 13.3 % (ref 11.5–15.5)
WBC: 5.9 10*3/uL (ref 4.0–10.5)

## 2014-01-07 LAB — BASIC METABOLIC PANEL
BUN: 8 mg/dL (ref 6–23)
CO2: 27 mEq/L (ref 19–32)
Calcium: 10 mg/dL (ref 8.4–10.5)
Chloride: 99 mEq/L (ref 96–112)
Creatinine, Ser: 0.52 mg/dL (ref 0.50–1.10)
GFR calc Af Amer: >90 mL/min (ref >90)
GFR calc non Af Amer: >90 mL/min (ref >90)
Glucose, Bld: 101 mg/dL — ABNORMAL HIGH (ref 70–99)
Potassium: 4.5 mEq/L (ref 3.7–5.3)
Sodium: 138 mEq/L (ref 137–147)

## 2014-01-07 NOTE — Pre-Procedure Instructions (Signed)
[place]  01/07/2014   Your procedure is scheduled on:  Wednesday, January 13, 2014 at 12:55 PM.   Report to [facility] Entrance "A" Admitting Office at 9:55 AM.   Call this number if you have problems the morning of surgery: [phone]   Remember:   Do not eat food or drink liquids after midnight Tuesday, 01/12/14.   Take these medicines the morning of surgery with A SIP OF WATER: levothyroxine (SYNTHROID, LEVOTHROID), predniSONE (DELTASONE), HYDROcodone-acetaminophen (NORCO/VICODIN) - if needed, diazepam (VALIUM) - if needed, cyclobenzaprine (FLEXERIL) - if needed.  Stop Vitamins, Fish Oil and Ibuprofen as of today.               Do not wear jewelry, make-up or nail polish.  Do not wear lotions, powders, or perfumes. You may wear deodorant.  Do not shave 48 hours prior to surgery.   Do not bring valuables to the hospital.  [facility] is not responsible                  for any belongings or valuables.               Contacts, dentures or bridgework may not be worn into surgery.  Leave suitcase in the car. After surgery it may be brought to your room.  For patients admitted to the hospital, discharge time is determined by your                treatment team.            Special Instructions: [facility] - Preparing for Surgery  Before surgery, you can play an important role.  Because skin is not sterile, your skin needs to be as free of germs as possible.  You can reduce the number of germs on you skin by washing with CHG (chlorahexidine gluconate) soap before surgery.  CHG is an antiseptic cleaner which kills germs and bonds with the skin to continue killing germs even after washing.  Please DO NOT use if you have an allergy to CHG or antibacterial soaps.  If your skin becomes reddened/irritated stop using the CHG and inform your nurse when you arrive at Short Stay.  Do not shave (including legs and underarms) for at least 48 hours prior to the first CHG shower.  You may  shave your face.  Please follow these instructions carefully:   1.  Shower with CHG Soap the night before surgery and the                                morning of Surgery.  2.  If you choose to wash your hair, wash your hair first as usual with your       normal shampoo.  3.  After you shampoo, rinse your hair and body thoroughly to remove the                      Shampoo.  4.  Use CHG as you would any other liquid soap.  You can apply chg directly       to the skin and wash gently with scrungie or a clean washcloth.  5.  Apply the CHG Soap to your body ONLY FROM THE NECK DOWN.        Do not use on open wounds or open sores.  Avoid contact with your eyes, ears, mouth and genitals (private parts).  Wash genitals (private parts) with your normal soap.  6.  Wash thoroughly, paying special attention to the area where your surgery        will be performed.  7.  Thoroughly rinse your body with warm water from the neck down.  8.  DO NOT shower/wash with your normal soap after using and rinsing off       the CHG Soap.  9.  Pat yourself dry with a clean towel.            10.  Wear clean pajamas.            11.  Place clean sheets on your bed the night of your first shower and do not        sleep with pets.  Day of Surgery  Do not apply any lotions the morning of surgery.  Please wear clean clothes to the hospital/surgery center.     Please read over the following fact sheets that you were given: Pain Booklet, Coughing and Deep Breathing, MRSA Information and Surgical Site Infection Prevention

## 2014-01-07 NOTE — Progress Notes (Signed)
Pt's PCP is Dr. Dennard Schaumann and oncologist is Dr. Sheldon Silvan.

## 2014-01-08 ENCOUNTER — Other Ambulatory Visit: Payer: Self-pay | Admitting: Family Medicine

## 2014-01-08 DIAGNOSIS — M5430 Sciatica, unspecified side: Secondary | ICD-10-CM

## 2014-01-11 MED ORDER — DIAZEPAM 10 MG PO TABS: 10.0000 mg | ORAL_TABLET | Freq: Three times a day (TID) | ORAL | Status: DC | PRN

## 2014-01-11 NOTE — Telephone Encounter (Signed)
rx was printed and faxed to pharmacy 

## 2014-01-12 ENCOUNTER — Other Ambulatory Visit: Payer: Medicare HMO

## 2014-01-12 ENCOUNTER — Other Ambulatory Visit: Payer: Self-pay | Admitting: Neurosurgery

## 2014-01-12 DIAGNOSIS — S22009A Unspecified fracture of unspecified thoracic vertebra, initial encounter for closed fracture: Secondary | ICD-10-CM

## 2014-01-12 MED ORDER — CEFAZOLIN SODIUM-DEXTROSE 2-3 GM-% IV SOLR
2.0000 g | INTRAVENOUS | Status: AC
  Administered 2014-01-13 (×2): 2 g via INTRAVENOUS

## 2014-01-13 ENCOUNTER — Ambulatory Visit
Admission: RE | Admit: 2014-01-13 | Discharge: 2014-01-13 | Disposition: A | Discharge status: Home / Self Care | Payer: Medicare HMO | Source: Ambulatory Visit | Attending: Neurosurgery | Admitting: Neurosurgery

## 2014-01-13 ENCOUNTER — Encounter (HOSPITAL_COMMUNITY): Payer: Self-pay | Admitting: Surgery

## 2014-01-13 ENCOUNTER — Encounter (HOSPITAL_COMMUNITY): Payer: Medicare HMO | Admitting: Anesthesiology

## 2014-01-13 ENCOUNTER — Encounter (HOSPITAL_COMMUNITY)
Admission: RE | Disposition: A | Discharge status: Skilled Nursing Facility | Payer: Self-pay | Source: Ambulatory Visit | Attending: Neurosurgery

## 2014-01-13 ENCOUNTER — Inpatient Hospital Stay (HOSPITAL_COMMUNITY): Payer: Medicare HMO

## 2014-01-13 ENCOUNTER — Inpatient Hospital Stay (HOSPITAL_COMMUNITY)
Admission: RE | Admit: 2014-01-13 | Discharge: 2014-01-18 | DRG: 460 | Disposition: A | Discharge status: Skilled Nursing Facility | Payer: Medicare HMO | Source: Ambulatory Visit | Attending: Neurosurgery | Admitting: Neurosurgery

## 2014-01-13 ENCOUNTER — Inpatient Hospital Stay (HOSPITAL_COMMUNITY): Payer: Medicare HMO | Admitting: Anesthesiology

## 2014-01-13 DIAGNOSIS — S22009A Unspecified fracture of unspecified thoracic vertebra, initial encounter for closed fracture: Secondary | ICD-10-CM

## 2014-01-13 DIAGNOSIS — F329 Major depressive disorder, single episode, unspecified: Secondary | ICD-10-CM | POA: Diagnosis present

## 2014-01-13 DIAGNOSIS — I739 Peripheral vascular disease, unspecified: Secondary | ICD-10-CM | POA: Diagnosis present

## 2014-01-13 DIAGNOSIS — Z881 Allergy status to other antibiotic agents status: Secondary | ICD-10-CM

## 2014-01-13 DIAGNOSIS — W19XXXA Unspecified fall, initial encounter: Secondary | ICD-10-CM | POA: Diagnosis present

## 2014-01-13 DIAGNOSIS — R339 Retention of urine, unspecified: Secondary | ICD-10-CM | POA: Diagnosis not present

## 2014-01-13 DIAGNOSIS — S22081A Stable burst fracture of T11-T12 vertebra, initial encounter for closed fracture: Secondary | ICD-10-CM | POA: Diagnosis present

## 2014-01-13 DIAGNOSIS — Z9981 Dependence on supplemental oxygen: Secondary | ICD-10-CM

## 2014-01-13 DIAGNOSIS — F3289 Other specified depressive episodes: Secondary | ICD-10-CM | POA: Diagnosis present

## 2014-01-13 DIAGNOSIS — G9741 Accidental puncture or laceration of dura during a procedure: Secondary | ICD-10-CM | POA: Diagnosis not present

## 2014-01-13 DIAGNOSIS — IMO0002 Reserved for concepts with insufficient information to code with codable children: Secondary | ICD-10-CM | POA: Diagnosis not present

## 2014-01-13 DIAGNOSIS — J4489 Other specified chronic obstructive pulmonary disease: Secondary | ICD-10-CM | POA: Diagnosis present

## 2014-01-13 DIAGNOSIS — Z888 Allergy status to other drugs, medicaments and biological substances status: Secondary | ICD-10-CM

## 2014-01-13 DIAGNOSIS — Z87891 Personal history of nicotine dependence: Secondary | ICD-10-CM

## 2014-01-13 DIAGNOSIS — J449 Chronic obstructive pulmonary disease, unspecified: Secondary | ICD-10-CM | POA: Diagnosis present

## 2014-01-13 DIAGNOSIS — K279 Peptic ulcer, site unspecified, unspecified as acute or chronic, without hemorrhage or perforation: Secondary | ICD-10-CM | POA: Diagnosis present

## 2014-01-13 DIAGNOSIS — I959 Hypotension, unspecified: Secondary | ICD-10-CM | POA: Diagnosis not present

## 2014-01-13 DIAGNOSIS — E039 Hypothyroidism, unspecified: Secondary | ICD-10-CM | POA: Diagnosis present

## 2014-01-13 HISTORY — PX: BACK SURGERY: SHX140

## 2014-01-13 LAB — TYPE AND SCREEN
ABO/RH(D): O NEG
Antibody Screen: NEGATIVE

## 2014-01-13 SURGERY — POSTERIOR LUMBAR FUSION 3 LEVEL
Anesthesia: General

## 2014-01-13 MED ORDER — ARTIFICIAL TEARS OP OINT
TOPICAL_OINTMENT | OPHTHALMIC | Status: AC
  Filled 2014-01-13: qty 3.5

## 2014-01-13 MED ORDER — LIDOCAINE-EPINEPHRINE 1 %-1:100000 IJ SOLN
INTRAMUSCULAR | Status: DC | PRN
  Administered 2014-01-13: 7.5 mL via INTRADERMAL

## 2014-01-13 MED ORDER — SODIUM CHLORIDE 0.9 % IJ SOLN
3.0000 mL | Freq: Two times a day (BID) | INTRAMUSCULAR | Status: DC
  Administered 2014-01-15 – 2014-01-17 (×3): 3 mL via INTRAVENOUS

## 2014-01-13 MED ORDER — DIAZEPAM 5 MG PO TABS
10.0000 mg | ORAL_TABLET | Freq: Three times a day (TID) | ORAL | Status: DC | PRN
  Administered 2014-01-16 – 2014-01-18 (×3): 10 mg via ORAL
  Filled 2014-01-13 (×3): qty 2

## 2014-01-13 MED ORDER — ROCURONIUM BROMIDE 100 MG/10ML IV SOLN
INTRAVENOUS | Status: DC | PRN
  Administered 2014-01-13 (×8): 10 mg via INTRAVENOUS
  Administered 2014-01-13: 40 mg via INTRAVENOUS

## 2014-01-13 MED ORDER — CITALOPRAM HYDROBROMIDE 40 MG PO TABS
40.0000 mg | ORAL_TABLET | Freq: Every day | ORAL | Status: DC
  Administered 2014-01-13 – 2014-01-17 (×5): 40 mg via ORAL
  Filled 2014-01-13 (×7): qty 1

## 2014-01-13 MED ORDER — ONDANSETRON HCL 4 MG/2ML IJ SOLN
4.0000 mg | INTRAMUSCULAR | Status: DC | PRN
  Administered 2014-01-14 (×2): 4 mg via INTRAVENOUS
  Filled 2014-01-13 (×2): qty 2

## 2014-01-13 MED ORDER — MEPERIDINE HCL 25 MG/ML IJ SOLN: 6.2500 mg | INTRAMUSCULAR | Status: DC | PRN

## 2014-01-13 MED ORDER — PROMETHAZINE HCL 25 MG/ML IJ SOLN: 6.2500 mg | INTRAMUSCULAR | Status: DC | PRN

## 2014-01-13 MED ORDER — PHENYLEPHRINE 40 MCG/ML (10ML) SYRINGE FOR IV PUSH (FOR BLOOD PRESSURE SUPPORT)
PREFILLED_SYRINGE | INTRAVENOUS | Status: AC
  Filled 2014-01-13: qty 10

## 2014-01-13 MED ORDER — MORPHINE SULFATE 2 MG/ML IJ SOLN
1.0000 mg | INTRAMUSCULAR | Status: DC | PRN
  Administered 2014-01-14: 2 mg via INTRAVENOUS
  Filled 2014-01-13: qty 1

## 2014-01-13 MED ORDER — ONDANSETRON HCL 4 MG/2ML IJ SOLN
INTRAMUSCULAR | Status: AC
  Filled 2014-01-13: qty 2

## 2014-01-13 MED ORDER — PROPOFOL 10 MG/ML IV BOLUS
INTRAVENOUS | Status: AC
  Filled 2014-01-13: qty 20

## 2014-01-13 MED ORDER — PHENOL 1.4 % MT LIQD: 1.0000 | OROMUCOSAL | Status: DC | PRN

## 2014-01-13 MED ORDER — OXYCODONE HCL 5 MG/5ML PO SOLN: 5.0000 mg | Freq: Once | ORAL | Status: DC | PRN

## 2014-01-13 MED ORDER — CYCLOBENZAPRINE HCL 10 MG PO TABS
ORAL_TABLET | ORAL | Status: AC
  Filled 2014-01-13: qty 1

## 2014-01-13 MED ORDER — NEOSTIGMINE METHYLSULFATE 1 MG/ML IJ SOLN
INTRAMUSCULAR | Status: DC | PRN
  Administered 2014-01-13: 3 mg via INTRAVENOUS

## 2014-01-13 MED ORDER — ALENDRONATE SODIUM 70 MG PO TABS: 70.0000 mg | ORAL_TABLET | ORAL | Status: DC

## 2014-01-13 MED ORDER — CYCLOBENZAPRINE HCL 10 MG PO TABS
10.0000 mg | ORAL_TABLET | Freq: Three times a day (TID) | ORAL | Status: DC | PRN
  Administered 2014-01-13 – 2014-01-18 (×7): 10 mg via ORAL
  Filled 2014-01-13 (×6): qty 1

## 2014-01-13 MED ORDER — LACTATED RINGERS IV SOLN
INTRAVENOUS | Status: DC | PRN
  Administered 2014-01-13 (×3): via INTRAVENOUS

## 2014-01-13 MED ORDER — CEFAZOLIN SODIUM-DEXTROSE 2-3 GM-% IV SOLR
INTRAVENOUS | Status: AC
  Filled 2014-01-13: qty 50

## 2014-01-13 MED ORDER — PHENYLEPHRINE HCL 10 MG/ML IJ SOLN
INTRAMUSCULAR | Status: DC | PRN
  Administered 2014-01-13: 80 ug via INTRAVENOUS

## 2014-01-13 MED ORDER — OXYCODONE HCL 5 MG/5ML PO SOLN: 5.0000 mg | Freq: Once | ORAL | Status: AC | PRN

## 2014-01-13 MED ORDER — GLYCOPYRROLATE 0.2 MG/ML IJ SOLN
INTRAMUSCULAR | Status: DC | PRN
  Administered 2014-01-13: 0.4 mg via INTRAVENOUS

## 2014-01-13 MED ORDER — LIDOCAINE HCL (CARDIAC) 20 MG/ML IV SOLN
INTRAVENOUS | Status: DC | PRN
  Administered 2014-01-13: 30 mg via INTRAVENOUS

## 2014-01-13 MED ORDER — ROCURONIUM BROMIDE 50 MG/5ML IV SOLN
INTRAVENOUS | Status: AC
  Filled 2014-01-13: qty 1

## 2014-01-13 MED ORDER — ACETAMINOPHEN 650 MG RE SUPP: 650.0000 mg | RECTAL | Status: DC | PRN

## 2014-01-13 MED ORDER — VITAMIN D3 25 MCG (1000 UNIT) PO TABS
5000.0000 [IU] | ORAL_TABLET | Freq: Every day | ORAL | Status: DC
  Administered 2014-01-14 – 2014-01-16 (×3): 5000 [IU] via ORAL
  Filled 2014-01-13 (×5): qty 5

## 2014-01-13 MED ORDER — SODIUM CHLORIDE 0.9 % IV SOLN: 250.0000 mL | INTRAVENOUS | Status: DC

## 2014-01-13 MED ORDER — MENTHOL 3 MG MT LOZG: 1.0000 | LOZENGE | OROMUCOSAL | Status: DC | PRN

## 2014-01-13 MED ORDER — FENTANYL CITRATE 0.05 MG/ML IJ SOLN
INTRAMUSCULAR | Status: DC | PRN
  Administered 2014-01-13: 150 ug via INTRAVENOUS
  Administered 2014-01-13: 25 ug via INTRAVENOUS
  Administered 2014-01-13: 50 ug via INTRAVENOUS
  Administered 2014-01-13: 25 ug via INTRAVENOUS
  Administered 2014-01-13 (×4): 50 ug via INTRAVENOUS

## 2014-01-13 MED ORDER — HYDROMORPHONE HCL PF 1 MG/ML IJ SOLN
0.2500 mg | INTRAMUSCULAR | Status: DC | PRN
  Administered 2014-01-13 (×2): 0.5 mg via INTRAVENOUS

## 2014-01-13 MED ORDER — OXYCODONE HCL 5 MG PO TABS
5.0000 mg | ORAL_TABLET | Freq: Once | ORAL | Status: AC | PRN
  Administered 2014-01-13: 5 mg via ORAL

## 2014-01-13 MED ORDER — SUCCINYLCHOLINE CHLORIDE 20 MG/ML IJ SOLN
INTRAMUSCULAR | Status: AC
  Filled 2014-01-13: qty 1

## 2014-01-13 MED ORDER — PHENYLEPHRINE HCL 10 MG/ML IJ SOLN
10.0000 mg | INTRAMUSCULAR | Status: DC | PRN
  Administered 2014-01-13: 50 ug/min via INTRAVENOUS

## 2014-01-13 MED ORDER — LIDOCAINE HCL (CARDIAC) 20 MG/ML IV SOLN
INTRAVENOUS | Status: AC
  Filled 2014-01-13: qty 5

## 2014-01-13 MED ORDER — SODIUM CHLORIDE 0.9 % IJ SOLN: 3.0000 mL | INTRAMUSCULAR | Status: DC | PRN

## 2014-01-13 MED ORDER — SURGIFOAM 100 EX MISC
CUTANEOUS | Status: DC | PRN
  Administered 2014-01-13: 17:00:00 via TOPICAL

## 2014-01-13 MED ORDER — OXYCODONE-ACETAMINOPHEN 5-325 MG PO TABS
1.0000 | ORAL_TABLET | ORAL | Status: DC | PRN
  Administered 2014-01-13: 2 via ORAL
  Administered 2014-01-14 – 2014-01-18 (×9): 1 via ORAL
  Filled 2014-01-13 (×7): qty 1
  Filled 2014-01-13: qty 2
  Filled 2014-01-13 (×2): qty 1

## 2014-01-13 MED ORDER — 0.9 % SODIUM CHLORIDE (POUR BTL) OPTIME
TOPICAL | Status: DC | PRN
  Administered 2014-01-13: 1000 mL

## 2014-01-13 MED ORDER — HEPARIN SODIUM (PORCINE) 5000 UNIT/ML IJ SOLN
5000.0000 [IU] | Freq: Three times a day (TID) | INTRAMUSCULAR | Status: DC
  Administered 2014-01-14 – 2014-01-18 (×14): 5000 [IU] via SUBCUTANEOUS
  Filled 2014-01-13 (×16): qty 1

## 2014-01-13 MED ORDER — HYDROMORPHONE HCL PF 1 MG/ML IJ SOLN
INTRAMUSCULAR | Status: AC
  Filled 2014-01-13: qty 1

## 2014-01-13 MED ORDER — OXYCODONE HCL 5 MG PO TABS
ORAL_TABLET | ORAL | Status: AC
  Filled 2014-01-13: qty 1

## 2014-01-13 MED ORDER — ACETAMINOPHEN 325 MG PO TABS
650.0000 mg | ORAL_TABLET | ORAL | Status: DC | PRN
  Administered 2014-01-16 – 2014-01-17 (×2): 650 mg via ORAL
  Filled 2014-01-13 (×2): qty 2

## 2014-01-13 MED ORDER — CEFAZOLIN SODIUM 1-5 GM-% IV SOLN
1.0000 g | Freq: Three times a day (TID) | INTRAVENOUS | Status: AC
  Administered 2014-01-14 (×2): 1 g via INTRAVENOUS
  Filled 2014-01-13 (×2): qty 50

## 2014-01-13 MED ORDER — SODIUM CHLORIDE 0.9 % IV SOLN
INTRAVENOUS | Status: DC
Start: 2014-01-13 — End: 2014-01-18
  Administered 2014-01-13: 23:00:00 via INTRAVENOUS

## 2014-01-13 MED ORDER — FENTANYL CITRATE 0.05 MG/ML IJ SOLN
INTRAMUSCULAR | Status: AC
  Filled 2014-01-13: qty 5

## 2014-01-13 MED ORDER — HYDROMORPHONE HCL PF 1 MG/ML IJ SOLN: 0.2500 mg | INTRAMUSCULAR | Status: DC | PRN

## 2014-01-13 MED ORDER — SENNOSIDES-DOCUSATE SODIUM 8.6-50 MG PO TABS: 1.0000 | ORAL_TABLET | Freq: Every evening | ORAL | Status: DC | PRN

## 2014-01-13 MED ORDER — BUPIVACAINE HCL (PF) 0.5 % IJ SOLN
INTRAMUSCULAR | Status: DC | PRN
  Administered 2014-01-13: 7.5 mL

## 2014-01-13 MED ORDER — LEVOTHYROXINE SODIUM 25 MCG PO TABS
25.0000 ug | ORAL_TABLET | Freq: Every day | ORAL | Status: DC
  Administered 2014-01-14 – 2014-01-18 (×5): 25 ug via ORAL
  Filled 2014-01-13 (×6): qty 1

## 2014-01-13 MED ORDER — POLYETHYLENE GLYCOL 3350 17 G PO PACK
17.0000 g | PACK | Freq: Two times a day (BID) | ORAL | Status: DC
  Administered 2014-01-13 – 2014-01-18 (×10): 17 g via ORAL
  Filled 2014-01-13 (×11): qty 1

## 2014-01-13 MED ORDER — DOCUSATE SODIUM 100 MG PO CAPS
100.0000 mg | ORAL_CAPSULE | Freq: Two times a day (BID) | ORAL | Status: DC
  Administered 2014-01-13 – 2014-01-18 (×10): 100 mg via ORAL
  Filled 2014-01-13 (×9): qty 1

## 2014-01-13 MED ORDER — LACTATED RINGERS IV SOLN
INTRAVENOUS | Status: DC
  Administered 2014-01-13: 11:00:00 via INTRAVENOUS

## 2014-01-13 MED ORDER — ADULT MULTIVITAMIN W/MINERALS CH
1.0000 | ORAL_TABLET | Freq: Every day | ORAL | Status: DC
  Administered 2014-01-14 – 2014-01-16 (×3): 1 via ORAL
  Filled 2014-01-13 (×5): qty 1

## 2014-01-13 MED ORDER — SODIUM CHLORIDE 0.9 % IR SOLN
Status: DC | PRN
  Administered 2014-01-13: 17:00:00

## 2014-01-13 MED ORDER — CALCIUM CARBONATE-VITAMIN D 500-200 MG-UNIT PO TABS
1.0000 | ORAL_TABLET | Freq: Every day | ORAL | Status: DC
  Administered 2014-01-14 – 2014-01-18 (×4): 1 via ORAL
  Filled 2014-01-13 (×6): qty 1

## 2014-01-13 MED ORDER — PROPOFOL 10 MG/ML IV BOLUS
INTRAVENOUS | Status: DC | PRN
  Administered 2014-01-13: 100 mg via INTRAVENOUS

## 2014-01-13 MED ORDER — ONDANSETRON HCL 4 MG/2ML IJ SOLN
INTRAMUSCULAR | Status: DC | PRN
  Administered 2014-01-13: 4 mg via INTRAVENOUS

## 2014-01-13 MED ORDER — THROMBIN 5000 UNITS EX SOLR
OROMUCOSAL | Status: DC | PRN
  Administered 2014-01-13 (×2): via TOPICAL

## 2014-01-13 MED ORDER — OXYCODONE HCL 5 MG PO TABS: 5.0000 mg | ORAL_TABLET | Freq: Once | ORAL | Status: DC | PRN

## 2014-01-13 MED ORDER — SENNA 8.6 MG PO TABS
1.0000 | ORAL_TABLET | Freq: Two times a day (BID) | ORAL | Status: DC
  Administered 2014-01-13 – 2014-01-18 (×10): 8.6 mg via ORAL
  Filled 2014-01-13 (×14): qty 1

## 2014-01-13 MED ORDER — ALBUMIN HUMAN 5 % IV SOLN
INTRAVENOUS | Status: DC | PRN
  Administered 2014-01-13 (×2): via INTRAVENOUS

## 2014-01-13 MED ORDER — DOCUSATE SODIUM 100 MG PO CAPS: 100.0000 mg | ORAL_CAPSULE | Freq: Two times a day (BID) | ORAL | Status: DC

## 2014-01-13 SURGICAL SUPPLY — 85 items
ADH SKN CLS APL DERMABOND .7 (GAUZE/BANDAGES/DRESSINGS) ×2
APL SKNCLS STERI-STRIP NONHPOA (GAUZE/BANDAGES/DRESSINGS) ×1
APL SRG 60D 8 XTD TIP BNDBL (TIP) ×1
BAG DECANTER FOR FLEXI CONT (MISCELLANEOUS) ×2 IMPLANT
BENZOIN TINCTURE PRP APPL 2/3 (GAUZE/BANDAGES/DRESSINGS) ×1 IMPLANT
BLADE SURG 11 STRL SS (BLADE) ×2 IMPLANT
BLADE SURG ROTATE 9660 (MISCELLANEOUS) IMPLANT
BUR MATCHSTICK NEURO 3.0 LAGG (BURR) ×2 IMPLANT
CANISTER SUCT 3000ML (MISCELLANEOUS) ×2 IMPLANT
CATH FOLEY 2WAY SLVR  5CC 14FR (CATHETERS)
CATH FOLEY 2WAY SLVR 5CC 14FR (CATHETERS) ×1 IMPLANT
CONT SPEC 4OZ CLIKSEAL STRL BL (MISCELLANEOUS) ×2 IMPLANT
COVER BACK TABLE 24X17X13 BIG (DRAPES) IMPLANT
COVER TABLE BACK 60X90 (DRAPES) ×2 IMPLANT
DECANTER SPIKE VIAL GLASS SM (MISCELLANEOUS) ×2 IMPLANT
DERMABOND ADVANCED (GAUZE/BANDAGES/DRESSINGS) ×2
DERMABOND ADVANCED .7 DNX12 (GAUZE/BANDAGES/DRESSINGS) IMPLANT
DRAPE C-ARM 42X72 X-RAY (DRAPES) ×3 IMPLANT
DRAPE C-ARMOR (DRAPES) ×2 IMPLANT
DRAPE LAPAROTOMY 100X72X124 (DRAPES) ×2 IMPLANT
DRAPE MICROSCOPE LEICA (MISCELLANEOUS) ×1 IMPLANT
DRAPE POUCH INSTRU U-SHP 10X18 (DRAPES) ×2 IMPLANT
DRAPE SURG 17X23 STRL (DRAPES) ×2 IMPLANT
DRSG OPSITE POSTOP 4X10 (GAUZE/BANDAGES/DRESSINGS) ×1 IMPLANT
DURAPREP 26ML APPLICATOR (WOUND CARE) ×2 IMPLANT
DURASEAL APPLICATOR TIP (TIP) ×1 IMPLANT
DURASEAL SPINE SEALANT 3ML (MISCELLANEOUS) ×1 IMPLANT
ELECT REM PT RETURN 9FT ADLT (ELECTROSURGICAL) ×2
ELECTRODE REM PT RTRN 9FT ADLT (ELECTROSURGICAL) ×1 IMPLANT
GAUZE SPONGE 4X4 16PLY XRAY LF (GAUZE/BANDAGES/DRESSINGS) ×1 IMPLANT
GLOVE BIO SURGEON STRL SZ8 (GLOVE) ×1 IMPLANT
GLOVE BIOGEL PI IND STRL 7.5 (GLOVE) ×1 IMPLANT
GLOVE BIOGEL PI INDICATOR 7.5 (GLOVE) ×2
GLOVE ECLIPSE 7.0 STRL STRAW (GLOVE) ×3 IMPLANT
GLOVE ECLIPSE 7.5 STRL STRAW (GLOVE) ×3 IMPLANT
GLOVE EXAM NITRILE LRG STRL (GLOVE) IMPLANT
GLOVE EXAM NITRILE MD LF STRL (GLOVE) ×1 IMPLANT
GLOVE EXAM NITRILE XL STR (GLOVE) IMPLANT
GLOVE EXAM NITRILE XS STR PU (GLOVE) IMPLANT
GOWN BRE IMP SLV AUR LG STRL (GOWN DISPOSABLE) ×2 IMPLANT
GOWN BRE IMP SLV AUR XL STRL (GOWN DISPOSABLE) IMPLANT
GOWN STRL REIN 2XL LVL4 (GOWN DISPOSABLE) IMPLANT
GOWN STRL REUS W/ TWL LRG LVL3 (GOWN DISPOSABLE) IMPLANT
GOWN STRL REUS W/ TWL XL LVL3 (GOWN DISPOSABLE) IMPLANT
GOWN STRL REUS W/TWL LRG LVL3 (GOWN DISPOSABLE) ×4
GOWN STRL REUS W/TWL XL LVL3 (GOWN DISPOSABLE) ×4
HEMOSTAT POWDER KIT SURGIFOAM (HEMOSTASIS) ×2 IMPLANT
HEMOSTAT POWDER SURGIFOAM 1G (HEMOSTASIS) ×2 IMPLANT
KIT BASIN OR (CUSTOM PROCEDURE TRAY) ×2 IMPLANT
KIT ROOM TURNOVER OR (KITS) ×2 IMPLANT
MARKER SKIN DUAL TIP RULER LAB (MISCELLANEOUS) ×1 IMPLANT
NDL HYPO 18GX1.5 BLUNT FILL (NEEDLE) IMPLANT
NDL HYPO 25X1 1.5 SAFETY (NEEDLE) ×1 IMPLANT
NDL SPNL 18GX3.5 QUINCKE PK (NEEDLE) IMPLANT
NEEDLE HYPO 18GX1.5 BLUNT FILL (NEEDLE) IMPLANT
NEEDLE HYPO 25X1 1.5 SAFETY (NEEDLE) ×2 IMPLANT
NEEDLE SPNL 18GX3.5 QUINCKE PK (NEEDLE) ×2 IMPLANT
NEEDLE TARGETING (NEEDLE) ×8 IMPLANT
NS IRRIG 1000ML POUR BTL (IV SOLUTION) ×2 IMPLANT
PACK LAMINECTOMY NEURO (CUSTOM PROCEDURE TRAY) ×2 IMPLANT
PAD ARMBOARD 7.5X6 YLW CONV (MISCELLANEOUS) ×6 IMPLANT
ROD PERC PREBENT 160MM (Rod) ×1 IMPLANT
ROD STRAIGHT PERC 180MM (Rod) ×1 IMPLANT
RUBBERBAND STERILE (MISCELLANEOUS) ×4 IMPLANT
SCREW PATHFINDER 5.5X35 (Screw) ×2 IMPLANT
SCREW PATHFINDER 5.5X40MM (Screw) ×2 IMPLANT
SCREW PATHFINDER MIS 5.5X45 (Screw) ×4 IMPLANT
SPONGE GAUZE 4X4 12PLY (GAUZE/BANDAGES/DRESSINGS) IMPLANT
SPONGE LAP 4X18 X RAY DECT (DISPOSABLE) IMPLANT
SPONGE SURGIFOAM ABS GEL 100 (HEMOSTASIS) ×2 IMPLANT
STAPLER SKIN PROX WIDE 3.9 (STAPLE) ×1 IMPLANT
STRIP CLOSURE SKIN 1/2X4 (GAUZE/BANDAGES/DRESSINGS) IMPLANT
SUT VIC AB 0 CT1 18XCR BRD8 (SUTURE) ×2 IMPLANT
SUT VIC AB 0 CT1 8-18 (SUTURE) ×8
SUT VIC AB 2-0 CT1 18 (SUTURE) IMPLANT
SUT VIC AB 3-0 FS2 27 (SUTURE) ×1 IMPLANT
SUT VIC AB 3-0 SH 8-18 (SUTURE) ×1 IMPLANT
SUT VICRYL 3-0 RB1 18 ABS (SUTURE) ×4 IMPLANT
SYR 20ML ECCENTRIC (SYRINGE) ×2 IMPLANT
SYR 3ML LL SCALE MARK (SYRINGE) IMPLANT
TOP CLSR SEQUOIA (Orthopedic Implant) ×8 IMPLANT
TOWEL OR 17X24 6PK STRL BLUE (TOWEL DISPOSABLE) ×2 IMPLANT
TOWEL OR 17X26 10 PK STRL BLUE (TOWEL DISPOSABLE) ×2 IMPLANT
TRAY FOLEY CATH 14FRSI W/METER (CATHETERS) ×1 IMPLANT
WATER STERILE IRR 1000ML POUR (IV SOLUTION) ×2 IMPLANT

## 2014-01-13 NOTE — Transfer of Care (Signed)
Immediate Anesthesia Transfer of Care Note  Patient: [name]  Procedure(s) Performed: Procedure(s) with comments: Thoracic Tweleve Laminectomy Transpedicular Decompression with Thoracic Ten to Lumbar Two Pedicle Screw Stabilization (N/A) - Thoracic Tweleve Laminectomy Transpedicular Decompression with Thoracic Ten to Lumbar Two Pedicle Screw Stabilization  Patient Location: PACU  Anesthesia Type:General  Level of Consciousness: awake, alert  and oriented  Airway & Oxygen Therapy: Patient Spontanous Breathing and Patient connected to nasal cannula oxygen  Post-op Assessment: Report given to PACU RN, Post -op Vital signs reviewed and stable and Patient moving all extremities  Post vital signs: Reviewed and stable  Complications: No apparent anesthesia complications

## 2014-01-13 NOTE — Anesthesia Preprocedure Evaluation (Addendum)
Anesthesia Evaluation  Patient identified by MRN, date of birth, ID band Patient awake    Reviewed: Allergy & Precautions, H&P , NPO status , Patient's Chart, lab work & pertinent test results  History of Anesthesia Complications Negative for: history of anesthetic complications  Airway Mallampati: II TM Distance: >3 FB Neck ROM: Full    Dental  (+) Chipped, Poor Dentition, Dental Advisory Given   Pulmonary shortness of breath and Long-Term Oxygen Therapy, COPD oxygen dependent, former smoker (quit '12),  S/p LLLobectomy for adenoca breath sounds clear to auscultation  + decreased breath sounds      Cardiovascular - angina+ Peripheral Vascular Disease (s/p CEA, AAA 3.1cm) Rhythm:Regular Rate:Normal     Neuro/Psych Depression  Neuromuscular disease    GI/Hepatic Neg liver ROS, hiatal hernia, PUD,   Endo/Other  Hypothyroidism   Renal/GU negative Renal ROS     Musculoskeletal   Abdominal   Peds  Hematology   Anesthesia Other Findings   Reproductive/Obstetrics                         Anesthesia Physical Anesthesia Plan  ASA: III  Anesthesia Plan: General   Post-op Pain Management:    Induction: Intravenous  Airway Management Planned: Oral ETT  Additional Equipment:   Intra-op Plan:   Post-operative Plan: Extubation in OR  Informed Consent: I have reviewed the patients History and Physical, chart, labs and discussed the procedure including the risks, benefits and alternatives for the proposed anesthesia with the patient or authorized representative who has indicated his/her understanding and acceptance.   Dental advisory given  Plan Discussed with: Surgeon and CRNA  Anesthesia Plan Comments: (Plan routine monitors, GETA)        Anesthesia Quick Evaluation

## 2014-01-13 NOTE — H&P (Signed)
History of Present Illness:  1. back pain  [name] is a 73 year old woman initially seen in the office. She comes in after experiencing a fall at the beginning of this month while trying to move a love seat. She says while trying to move it she fell backwards onto her butt, and since then has had fairly significant back pain. She says the pain also radiates to the inside of her right leg and she does get some burning on the lateral aspect of her right calf. She has several similar symptoms on the left side, however the right is much worse than left. Initially she was walking with a walker, however because of the significant pain she is really not been walking much at all. When she is up, she says her legs feel like they're going to give out and she will fall. She has not had any changes in bladder function. She has no upper extremity symptoms.   PAST MEDICAL/SURGICAL HISTORY (Detailed)  Disease/disorder Onset Date Management Date Comments    Lung surgery  CRR 12/31/2013 -    Hysterectomy, total  CRR 12/31/2013 -    Cholecystectomy      Aneurysm clipping/resection    Cancer, lung    CRR 12/31/2013 -  COPD       PAST MEDICAL HISTORY, SURGICAL HISTORY, FAMILY HISTORY, SOCIAL HISTORY AND REVIEW OF SYSTEMS  I have reviewed the patient's past medical, surgical, family and social history as well as the comprehensive review of systems as included on the [facility] history form dated 12/31/2013, which I have signed.  Family History (Detailed)  Relationship Family Member Name Deceased Age at Death Condition Onset Age Cause of Death  Father  Y 88 Heart disease  Y  Mother  Y 91 Broken hip  N  SOCIAL HISTORY (Detailed)  Tobacco use reviewed.  Preferred language is Unknown.  Smoking status: Former smoker.  SMOKING STATUS  Use Status Type Smoking Status Usage Per Day Years Used Total Pack Years  yes Cigarette Former smoker  20   CESSATION  Type Date Quit Longest Tobacco  Free Cessation Method  Cigarette 10/08/1992    HOME ENVIRONMENT/SAFETY  The Patient has fallen 1 times in the last year.  The fall(s) resulted in injury. Details: Back injury.   MEDICATIONS(added, continued or stopped this visit):    Started Medication Directions Instruction Stopped   citalopram 40 mg tablet take 1 tablet by oral route every bedtime     hydrocodone 5 mg-acetaminophen 325 mg tablet take 1 tablet by oral route every 6 hours as needed for pain     levothyroxine 25 mcg tablet take 1 tablet by oral route every day     Valium 10 mg tablet take 1/2 tablet by oral route every 8 hours as needed for muscle spasms    ALLERGIES:  Ingredient Reaction Medication Name Comment  IOVERSOL Unknown    SULFA (SULFONAMIDE ANTIBIOTICS) Hives    CIPROFLOXACIN Hives    Reviewed, updated.  REVIEW OF SYSTEMS  System Neg/Pos Details  Constitutional Negative Chills, fatigue, fever, malaise, night sweats, weight gain and weight loss.  ENMT Positive Wear glasses.  ENMT Negative Ear drainage, hearing loss, nasal drainage, otalgia, sinus pressure and sore throat.  Eyes Negative Eye discharge, eye pain and vision changes.  Respiratory Positive Emphysema.  Respiratory Negative Chronic cough, cough, dyspnea, known TB exposure and wheezing.  Cardio Negative Chest pain, claudication, edema and irregular heartbeat/palpitations.  GI Positive Abdominal pain.  GI  Negative Blood in stool, change in stool pattern, constipation, decreased appetite, diarrhea, heartburn, nausea and vomiting.  GU Negative Dysuria, hematuria, hot flashes, irregular menses, polyuria, urinary frequency, urinary incontinence and urinary retention.  Endocrine Positive Thyroid disease.  Endocrine Negative Cold intolerance, heat intolerance, polydipsia and polyphagia.  Neuro Negative Dizziness, extremity weakness, gait disturbance, headache, memory impairment, numbness in extremity, seizures and tremors.  Psych Negative Anxiety,  depression and insomnia.  Integumentary Negative Brittle hair, brittle nails, change in shape/size of mole(s), hair loss, hirsutism, hives, pruritus, rash and skin lesion.  MS Negative Back pain, joint pain, joint swelling, muscle weakness and neck pain.  Hema/Lymph Negative Easy bleeding, easy bruising and lymphadenopathy.  Allergic/Immuno Negative Contact allergy, environmental allergies, food allergies and seasonal allergies.  Reproductive Negative Breast discharge, breast lump(s), dysmenorrhea, dyspareunia, history of abnormal PAP smear and vaginal discharge.  Vitals  Date Temp F BP Pulse Ht In Wt Lb BMI BSA Pain Score  12/31/2013  107/69 84 66 100 16.14  8/10   PHYSICAL EXAM  Awake, alert, oriented Speech fluent CN grossly intact Motor exam: 4/5 Hip flexors bilaterally 5/5 knee extensors bilaterally 4+/5 PF bilaterally 2-3/5 DF bilaterally   DIAGNOSTIC RESULTS  MRI of the thoracic spine demonstrates a burst fracture of T12 with a retropulsed bone fragment causing significant spinal canal compromise. There is approximately 80-90% loss of vertebral body height at T12. There is slight kyphosis of the thoracic spine above this level.  MRI also incidentally demonstrates a 3 x 3.5 cm infrarenal abdominal aortic aneurysm.   CT also demonstrates the T12 fracture with development of some kyphosis at this level. There is also wedge compression fracture of T8.   IMPRESSION  73 year old woman with T12 burst fracture and retropulsed bone fragment, with clinical symptoms suggestive of myelopathy and physical exam findings of proximal weakness versus giveaway weakness.   PLAN - Transpedicular T12 decompression and stabilization with pedicle screws extending from T10 to L2   MRI findings were reviewed in detail with the patient and her friend. I explained that her symptoms are likely a result of the retropulsed bone fragment causing compression of the conus. Also given the slight kyphosis and  degree of compression, I do believe that she does require stabilization of the thoracolumbar junction. I therefore recommended the surgery above. The risks and benefits of the surgery were explained in detail to the patient and her friend including the risk of spinal cord injury causing worsening weakness or paralysis, bladder dysfunction, bleeding and infection, and need for future surgery. The patient and her friend understood our discussion and are willing to proceed. All their questions were answered.

## 2014-01-13 NOTE — Progress Notes (Signed)
Patient on call light and requested that her pulse ox be checked as patient felt her oxygen was low. Patient stated "I wear oxygen at home only when I feel like I need it, I don't wear it all the time." Nurse checked O2 and sats were 89% on room air. Nurse placed patient on 2 liters of O2 [place] and sats increased to 95%. Patient denied having any current shortness of breath. Son at chair side. Will continue to closely monitor.

## 2014-01-13 NOTE — Progress Notes (Signed)
PHARMACIST - PHYSICIAN COMMUNICATION  CONCERNING: P&T Medication Policy Regarding Oral Bisphosphonates  RECOMMENDATION: Your order for alendronate (Fosamax) has been discontinued at this time.  If the patient's post-hospital medical condition warrants safe use of this class of drugs, please resume the pre-hospital regimen upon discharge.  DESCRIPTION:  Alendronate (Fosamax), ibandronate (Boniva), and risedronate (Actonel) can cause severe esophageal erosions in patients who are unable to remain upright at least 30 minutes after taking this medication.   Since brief interruptions in therapy are thought to have minimal impact on bone mineral density, the Pharmacy & Therapeutics Committee has established that bisphosphonate orders should be routinely discontinued during hospitalization.   To override this safety policy and permit administration of Boniva, Fosamax, or Actonel in the hospital, prescribers must write "DO NOT HOLD" in the comments section when placing the order for this class of medications.    

## 2014-01-13 NOTE — Anesthesia Procedure Notes (Signed)
Procedure Name: Intubation Date/Time: 01/13/2014 4:02 PM Performed by: Trixie Deis A Pre-anesthesia Checklist: Patient identified, Timeout performed, Emergency Drugs available, Suction available and Patient being monitored Patient Re-evaluated:Patient Re-evaluated prior to inductionOxygen Delivery Method: Circle system utilized Preoxygenation: Pre-oxygenation with 100% oxygen Intubation Type: IV induction Ventilation: Mask ventilation without difficulty Laryngoscope Size: Mac and 3 Grade View: Grade I Tube type: Oral Tube size: 7.5 mm Number of attempts: 1 Airway Equipment and Method: Stylet Placement Confirmation: ETT inserted through vocal cords under direct vision,  positive ETCO2 and breath sounds checked- equal and bilateral Secured at: 21 cm Tube secured with: Tape Dental Injury: Teeth and Oropharynx as per pre-operative assessment

## 2014-01-13 NOTE — Op Note (Signed)
PREOP DIAGNOSIS: T12 Burst Fracture   POSTOP DIAGNOSIS: Same  PROCEDURE: 1. T12 laminectomy with bilateral facetectomy and bilateral transpedicular decompression of thecal sac 2. Nonsegmental pedicle screw instrumentation at T10, T11, L1, L2 3. Use of intraoperative microscope for microdissection  SURGEON: Dr. Consuella Lose, MD  ASSISTANT: Dr. Sherley Bounds M.D.  ANESTHESIA: General Endotracheal  EBL: 500cc  SPECIMENS: None  DRAINS: None  COMPLICATIONS: Small durotomy on the lateral edge of the right T12 nerve root without active CSF leak seen  CONDITION: Hemodynamically stable to post anesthesia care unit  HISTORY: [name] is a 73 y.o. female who initially presented to the outpatient clinic with back pain several weeks after a fall. She had undergone MRI scan of the lumbar spine which demonstrated a T12 burst fracture with a significant portion of retropulsed bone into the canal. Her exam demonstrated some weakness of both lower extremities, and she stated her legs were giving out and was therefore getting around in a wheelchair. Given these findings, operative decompression stabilization was indicated. The risks and benefits of the surgery were explained in detail to the patient. After all her questions were answered, consent was obtained.  PROCEDURE IN DETAIL: After informed consent was obtained and witnessed, the patient was brought to the operating room. After induction of general anesthesia, the patient was positioned on the operative table in the prone position. All pressure points were meticulously padded. A midline skin incision extending from the mid thoracic spine to the lumbar spine was marked out and prepped and draped in the usual sterile fashion.  After timeout was conducted, skin was infiltrated with local anesthetic. Skin incision was then opened sharply and Bovie electrocautery was used to dissect the cutaneous tissue until the lumbodorsal fascia was  identified. The skin was then elevated in the suprafascial plane. Finder needle was then introduced and intraoperative fluoroscopy was used to confirm placement at the level of the T12 pedicle. The fascia was then opened and subperiosteal dissection was carried out on the T12 lamina. The T12 bilateral transverse processes were identified. Self-retaining retractor was then placed. The microscope was then draped sterilely and brought into the field, and the decompression was done under the microscope using microdissection.  Using a high-speed drill and Kerrison rongeurs, a complete T12 laminectomy and facetectomy was completed. The lateral edge of the thecal sac was then identified, and the right-sided T12 pedicle was drilled out flush with the posterior edge of the vertebral body. Large pieces of retropulsed bone were easily identified significantly compromising the spinal canal. Using curettes, these bone fragments were tapped anteriorly to free up the spinal canal. After this was done, there did appear to be more bone fragments past the midline which were not able to be decompressed from the left-sided approach. At this point, a similar procedure was carried out to complete a right-sided pediculectomy. Injuring out the pedicle, a small durotomy was created on the shoulder of the right T12 nerve root. No significant CSF leak however was seen. Once the pedicle was removed, a similar procedure using curettes was used to decompress the thecal sac of retropulsed bone fragments.  Having completed the decompression, the microscope was removed and under intraoperative fluoroscopy guidance, Jamshidi needles were used to cannulate the pedicles at bilateral T10 and T11. K wires were then introduced, and under lateral fluoroscopy the pedicles were tapped and 5.5 x 35 screws were placed at T10 and T11. Using a similar procedure, Jamshidi needles were introduced into the pedicles of L1  and L2. Again under lateral  fluoroscopy, these pedicles were tapped and 5.5 x 45 mm screws were placed in L1 and L2.   At this point, the decompression site was inspected, and good hemostasis was achieved using morcellized Gelfoam. A layer of DuraSeal was then applied because of the small right-sided T12 durotomy.  Rods were then selected and bent to have a slight kyphotic shape, and these are then passed underneath the fascia through the screw heads. Setscrews were then placed and final tightened. Towers were then removed.  At this point, the wound is irrigated with copious amounts of bacitracin irrigation. The fascial openings for the screws as was the decompression were closed using interrupted 0 Vicryl stitches. The skin was then reapproximated using interrupted 0 Vicryl stitches, and skin was closed with a subcutaneous layer of interrupted 3-0 Vicryl stitches. A layered Dermabond was then applied, and a sterile dressing was placed. The patient was then transferred to the stretcher, extubated, and taken to the postanesthesia care unit in stable hemodynamic condition.  At the end of the case all sponge, needle, and instrument counts were correct.

## 2014-01-14 MED ORDER — SODIUM CHLORIDE 0.9 % IV BOLUS (SEPSIS)
500.0000 mL | Freq: Once | INTRAVENOUS | Status: AC
  Administered 2014-01-14: 500 mL via INTRAVENOUS

## 2014-01-14 MED ORDER — PANTOPRAZOLE SODIUM 40 MG PO TBEC
40.0000 mg | DELAYED_RELEASE_TABLET | Freq: Every day | ORAL | Status: DC
  Administered 2014-01-14 – 2014-01-18 (×5): 40 mg via ORAL
  Filled 2014-01-14 (×5): qty 1

## 2014-01-14 NOTE — Evaluation (Addendum)
Occupational Therapy Evaluation Patient Details Name: [name] MRN: [id number] DOB: 08/30/1941 Today's Date: 01/14/2014    History of Present Illness T12 laminectomy with bilateral facetectomy and bilateral transpedicular decompression of thecal sac; Nonsegmental pedicle screw instrumentation at T10, T11, L1, L2 due to T12 burst fracture with a significant portion of retropulsed bone into the canal which she sustained when sustaining a fall while moving a loveseat.   Clinical Impression   Pt admitted with above. She demonstrates the below listed deficits and will benefit from continued OT to maximize safety and independence with BADLs.  Bed level evaluation completed with OT as pt waiting on son to bring TLSO.  Pt instructed in back precautions and log rolling technique - pain is currently 8/10 in abdomen.  Assisted pt with repositioning and RN notified.  Pt lives alone and has been managing at home since fall ~ 5 wks ago and has been managing her TLSO at home with min A from neighbors.  Anticipate she will progress well with OT, however, will wait until able to progress to OOB activity to make definitive discharge recommendations.      Follow Up Recommendations  Supervision/Assistance - 24 hour ; ? SNF   Equipment Recommendations  3 in 1 bedside comode;Tub/shower bench    Recommendations for Other Services       Precautions / Restrictions Precautions Precautions: Back Precaution Comments: Pt was instructed in back precautions  Required Braces or Orthoses: Spinal Brace Spinal Brace: Thoracolumbosacral orthotic Other Brace/Splint: Son currently has TLSO and pt is waiting on him to bring it      Mobility Bed Mobility Overal bed mobility: Needs Assistance Bed Mobility: Rolling Rolling: Min assist         General bed mobility comments: Pt instructed in log rolling.  Reliant on bed rails for rolling  Transfers                 General transfer comment: Not attempted  at this time due to waiting on TLSO    Balance                                            ADL Overall ADL's : Needs assistance/impaired Eating/Feeding: Set up;Bed level   Grooming: Wash/dry hands;Wash/dry face;Oral care;Brushing hair;Set up;Bed level   Upper Body Bathing: Moderate assistance;Bed level   Lower Body Bathing: Maximal assistance;Bed level   Upper Body Dressing : Maximal assistance;Bed level   Lower Body Dressing: Total assistance;Bed level   Toilet Transfer: Total assistance   Toileting- Clothing Manipulation and Hygiene: Total assistance;Bed level         General ADL Comments: Pt does not have brace in room at this time.  Assisted pt with bed mobility in attempts to reduce pain and increase comfort.  Instructed pt in back precautions and log rolling      Vision                     Perception     Praxis      Pertinent Vitals/Pain 8/10 abdomen.  RN notified and pt repositioned     Hand Dominance Right   Extremity/Trunk Assessment Upper Extremity Assessment Upper Extremity Assessment: Overall WFL for tasks assessed   Lower Extremity Assessment Lower Extremity Assessment: Defer to PT evaluation       Communication Communication Communication: No difficulties  Cognition Arousal/Alertness: Awake/alert Behavior During Therapy: WFL for tasks assessed/performed Overall Cognitive Status: Within Functional Limits for tasks assessed                     General Comments       Exercises       Shoulder Instructions      Home Living Family/patient expects to be discharged to:: Private residence Living Arrangements: Alone Available Help at Discharge: Friend(s);Available PRN/intermittently Type of Home: Apartment Home Access: Stairs to enter Entrance Stairs-Number of Steps: 1   Home Layout: One level     Bathroom Shower/Tub: Tub/shower unit;Curtain Shower/tub characteristics: Architectural technologist:  Handicapped height     Home Equipment: Environmental consultant - 2 wheels;Wheelchair - manual   Additional Comments: Pt lives in a retirement community      Prior Functioning/Environment Level of Independence: Independent with assistive device(s)        Comments: She has been ambulating with walker x 5 weeks due to pain, and has been taking sponge bathes due to fear of another fall and pain.  Prior to this fall, pt was fully independent    OT Diagnosis: Generalized weakness;Acute pain   OT Problem List: Decreased strength;Decreased activity tolerance;Decreased knowledge of use of DME or AE;Decreased knowledge of precautions;Pain   OT Treatment/Interventions: Self-care/ADL training;DME and/or AE instruction;Therapeutic activities;Patient/family education    OT Goals(Current goals can be found in the care plan section) Acute Rehab OT Goals Patient Stated Goal: to get better OT Goal Formulation: With patient Time For Goal Achievement: 01/21/14 Potential to Achieve Goals: Good ADL Goals Pt Will Perform Grooming: with supervision;standing Pt Will Perform Upper Body Bathing: with set-up;sitting Pt Will Perform Lower Body Bathing: with supervision;sit to/from stand;with adaptive equipment Pt Will Perform Upper Body Dressing: with supervision;sitting Pt Will Perform Lower Body Dressing: with supervision;with adaptive equipment;sit to/from stand Pt Will Transfer to Toilet: with supervision;ambulating;regular height toilet;bedside commode;grab bars Pt Will Perform Toileting - Clothing Manipulation and hygiene: with supervision;with adaptive equipment;sit to/from stand Pt Will Perform Tub/Shower Transfer: Tub transfer;with min assist;tub bench;rolling walker;ambulating Additional ADL Goal #1: Pt will be independent with back precautions during BADLs and functional mobility   OT Frequency: Min 2X/week   Barriers to D/C: Decreased caregiver support          Co-evaluation              End of  Session Nurse Communication: Mobility status;Patient requests pain meds  Activity Tolerance: Patient limited by pain;Other (comment) (no having TLSO) Patient left: in chair;with call bell/phone within reach;with bed alarm set   Time: 5945-8592 OT Time Calculation (min): 20 min Charges:  OT General Charges $OT Visit: 1 Procedure OT Evaluation $Initial OT Evaluation Tier I: 1 Procedure OT Treatments $Therapeutic Activity: 8-22 mins G-Codes:    Annye Forrey M Gabrianna Fassnacht 2014/01/22, 1:25 PM

## 2014-01-14 NOTE — Progress Notes (Signed)
No issues overnight. Pt has appropriate back pain controlled with current meds. No new c/o, still has "soreness" in the legs.  EXAM:  BP 92/58  Pulse 99  Temp(Src) 97.7 F (36.5 C) (Oral)  Resp 18  Ht 5\' 6"  (1.676 m)  Wt 59.421 kg (131 lb)  BMI 21.15 kg/m2  SpO2 100%  Awake, alert, oriented  Speech fluent, appropriate  CN grossly intact  5/5 BUE 4/5 Bilat IP 4+/5 bilat Q 5/5 bilat PF 1-2/5 bilat DF Wound dressing clean, dry   IMPRESSION:  73 y.o. female POD#1 s/p T12 decompression, T10-L2 stabilization - Neurologically at baseline with BLE weakness - Slightly hypotensive but asymptomatic  PLAN: - D/C foley today - OOB with PT/OT - 500 cc fluid bolus

## 2014-01-14 NOTE — Progress Notes (Signed)
Patient complaining of epigastric pain and feels like there could be a blockage. She is taking stool softeners and laxatives but states she has not had a bowel movement in about a week. She also states that she is burping a lot. Rande Brunt Romelo Sciandra

## 2014-01-14 NOTE — Evaluation (Signed)
Physical Therapy Evaluation Patient Details Name: [name] MRN: [id number] DOB: December 28, 1940 Today's Date: 01/14/2014   History of Present Illness  T12 laminectomy with bilateral facetectomy and bilateral transpedicular decompression of thecal sac; Nonsegmental pedicle screw instrumentation at T10, T11, L1, L2 due to T12 burst fracture with a significant portion of retropulsed bone into the canal which she sustained when sustaining a fall while moving a loveseat.  Clinical Impression  Pt very painful with any movement and requires A for all movements.  Pt only able to tolerate sitting EOB today 2/2 pain and seeming anxious.  Spoke with RN about clarifying brace orders as pt states she was allowed to put brace on prior to surgery, however no orders for brace use after surgery.  At this time feel SNF is safest D/C option for pt instead of home where she will be alone at times.  Will continue to follow.      Follow Up Recommendations SNF    Equipment Recommendations   (TBD)    Recommendations for Other Services       Precautions / Restrictions Precautions Precautions: Back Precaution Comments: Pt was instructed in back precautions  Required Braces or Orthoses: Spinal Brace Spinal Brace: Thoracolumbosacral orthotic Other Brace/Splint: pt states she can take brace on/off in sitting, though states she typicall left brace on and slept in brace.  Spoke with RN about getting MD to clarify brace orders.   Restrictions Weight Bearing Restrictions: No      Mobility  Bed Mobility Overal bed mobility: Needs Assistance Bed Mobility: Rolling;Sidelying to Sit;Sit to Sidelying Rolling: Min assist Sidelying to sit: Mod assist     Sit to sidelying: Mod assist General bed mobility comments: cues for sequencing and log roll.  pt relies heavily on bed rails to A with bringing trunk up to sitting.  A with movement of LEs and A with bringing trunk up to sitting.    Transfers                  General transfer comment: Not attempted at this time due to waiting on TLSO  Ambulation/Gait                Stairs            Wheelchair Mobility    Modified Rankin (Stroke Patients Only)       Balance Overall balance assessment: Needs assistance;History of Falls Sitting-balance support: Bilateral upper extremity supported;Feet supported Sitting balance-Leahy Scale: Poor Sitting balance - Comments: pt with posterior lean and relies on Bil UEs to hold self in sitting.  Attempted to have pt wt shift anteriorly, but pt states too painful.  Attempted to don brace sitting EOB however pt states too painful to fasten bottom strap.                                       Pertinent Vitals/Pain 10/10 with mobility.  Premedicated.      Home Living Family/patient expects to be discharged to:: Private residence Living Arrangements: Alone Available Help at Discharge: Friend(s);Available PRN/intermittently Type of Home: Apartment Home Access: Stairs to enter   Entrance Stairs-Number of Steps: 1 Home Layout: One level Home Equipment: Walker - 2 wheels;Wheelchair - manual Additional Comments: Pt lives in a retirement community    Prior Function Level of Independence: Independent with assistive device(s)         Comments: She  has been ambulating with walker x 5 weeks due to pain, and has been taking sponge bathes due to fear of another fall and pain.  Prior to this fall, pt was fully independent     Hand Dominance   Dominant Hand: Right    Extremity/Trunk Assessment   Upper Extremity Assessment: Defer to OT evaluation           Lower Extremity Assessment:  (Limited by pain)      Cervical / Trunk Assessment: Normal  Communication   Communication: No difficulties  Cognition Arousal/Alertness: Awake/alert Behavior During Therapy: Anxious Overall Cognitive Status: Within Functional Limits for tasks assessed                       General Comments      Exercises        Assessment/Plan    PT Assessment Patient needs continued PT services  PT Diagnosis Difficulty walking;Acute pain   PT Problem List Decreased strength;Decreased activity tolerance;Decreased balance;Decreased mobility;Decreased knowledge of use of DME;Decreased knowledge of precautions;Pain  PT Treatment Interventions DME instruction;Gait training;Stair training;Functional mobility training;Therapeutic activities;Therapeutic exercise;Balance training;Neuromuscular re-education;Patient/family education   PT Goals (Current goals can be found in the Care Plan section) Acute Rehab PT Goals Patient Stated Goal: to get better PT Goal Formulation: With patient Time For Goal Achievement: 01/28/14 Potential to Achieve Goals: Good    Frequency Min 3X/week   Barriers to discharge Decreased caregiver support      Co-evaluation               End of Session Equipment Utilized During Treatment: Back brace;Oxygen Activity Tolerance: Patient limited by pain Patient left: in bed;with call bell/phone within reach Nurse Communication: Mobility status         Time: 2811-8867 PT Time Calculation (min): 41 min   Charges:   PT Evaluation $Initial PT Evaluation Tier I: 1 Procedure PT Treatments $Therapeutic Activity: 23-37 mins   PT G CodesCatarina Sanford, [place] [phone] 01/14/2014, 3:33 PM

## 2014-01-14 NOTE — Progress Notes (Signed)
Utilization review completed. Najla Aughenbaugh, RN, BSN. 

## 2014-01-15 MED ORDER — BISACODYL 10 MG RE SUPP
10.0000 mg | Freq: Once | RECTAL | Status: AC
  Administered 2014-01-15: 10 mg via RECTAL
  Filled 2014-01-15: qty 1

## 2014-01-15 NOTE — Progress Notes (Signed)
Patient was due to void at [place]. Bladder scanner showed 522mL. In and out catheter received 548mL at 0115. Patient will have six more hours to void before a foley catheter will need to be placed. Will continue to monitor. Rande Brunt Gerren Hoffmeier

## 2014-01-15 NOTE — Progress Notes (Signed)
Physical Therapy Treatment Patient Details Name: [name] MRN: [id number] DOB: 11/05/1940 Today's Date: 01/15/2014    History of Present Illness T12 laminectomy with bilateral facetectomy and bilateral transpedicular decompression of thecal sac; Nonsegmental pedicle screw instrumentation at T10, T11, L1, L2 due to T12 burst fracture with a significant portion of retropulsed bone into the canal which she sustained when sustaining a fall while moving a loveseat.    PT Comments    Patient motivated for therapy today. Patient required A for OOB and with standing. Once patient started gait training patient became fatigue. Oxygen was monitored during treatment. Patient requires A with mobility for mobility and safe technique. Patient would benefit from SNF to help progress towards goals.    Follow Up Recommendations  SNF     Equipment Recommendations       Recommendations for Other Services       Precautions / Restrictions Precautions Precautions: Back Required Braces or Orthoses: Spinal Brace Spinal Brace: Thoracolumbosacral orthotic Other Brace/Splint: Patient requires A donning/doffing brace. Patient applies brace in sitting Restrictions Weight Bearing Restrictions: No    Mobility  Bed Mobility Overal bed mobility: Needs Assistance Bed Mobility: Rolling;Sidelying to Sit;Sit to Sidelying Rolling: Min assist Sidelying to sit: Mod assist     Sit to sidelying: Mod assist General bed mobility comments: Patient requires mod A for bed mobility and to bring upright from sidleying. Patient relies on bed rails to A.   Transfers Overall transfer level: Needs assistance Equipment used: Rolling walker (2 wheeled) Transfers: Sit to/from Stand Sit to Stand: Mod assist         General transfer comment: Mod + 2 for physical A  sit to stand. Patient requires A to power up and cues for hand placement  Ambulation/Gait Ambulation/Gait assistance: Mod assist Ambulation Distance  (Feet): 20 Feet Assistive device: Rolling walker (2 wheeled) Gait Pattern/deviations: Step-to pattern;Decreased stride length     General Gait Details: patient limited by fatigue. Patient required mod A for gait technique and steering RW   Stairs            Wheelchair Mobility    Modified Rankin (Stroke Patients Only)       Balance Overall balance assessment: Needs assistance;History of Falls Sitting-balance support: Bilateral upper extremity supported;Feet supported Sitting balance-Leahy Scale: Poor Sitting balance - Comments: Patient required Bilateral UE support while brace being donned.   Standing balance support: Bilateral upper extremity supported Standing balance-Leahy Scale: Poor Standing balance comment: patient relies heavily on RW with UE while in standing position                    Cognition Arousal/Alertness: Awake/alert Behavior During Therapy: WFL for tasks assessed/performed Overall Cognitive Status: Within Functional Limits for tasks assessed                      Exercises      General Comments        Pertinent Vitals/Pain Patient denies pain. O2 monitored throughout treatment.     Home Living                      Prior Function            PT Goals (current goals can now be found in the care plan section) Progress towards PT goals: Progressing toward goals    Frequency  Min 3X/week    PT Plan Current plan remains appropriate    Co-evaluation  End of Session Equipment Utilized During Treatment: Gait belt;Back brace Activity Tolerance: Patient limited by fatigue Patient left: in chair;with call bell/phone within reach;with chair alarm set;with nursing/sitter in room     Time: 1355-1428 PT Time Calculation (min): 33 min  Charges:                       G Codes:      [place], SPTA 01/15/2014, 2:59 PM

## 2014-01-15 NOTE — Progress Notes (Signed)
Seen and agreed 01/15/2014 Tonia Brooms Nelissa Bolduc PTA [phone] pager [phone] office

## 2014-01-15 NOTE — Progress Notes (Signed)
Occupational Therapy Treatment Patient Details Name: [name] MRN: [id number] DOB: [birth year] Today's Date: 01/15/2014    History of present illness 73 y.o amditted for T12 laminectomy with bilateral facetectomy and bilateral transpedicular decompression of thecal sac; Nonsegmental pedicle screw instrumentation at T10, T11, L1, L2 due to T12 burst fracture with a significant portion of retropulsed bone into the canal which she sustained when sustaining a fall while moving a loveseat.  Pt with h/o COPD and lung CA   OT comments  Pt making slow progress with OT due to pain.  She requires max - total A with LB ADLs.  Recommend SNF, and she is agreeable.   Follow Up Recommendations  SNF;Supervision/Assistance - 24 hour    Equipment Recommendations  3 in 1 bedside comode;Tub/shower bench    Recommendations for Other Services      Precautions / Restrictions Precautions Precautions: Back Required Braces or Orthoses: Spinal Brace Spinal Brace: Thoracolumbosacral orthotic Other Brace/Splint: Patient requires A donning/doffing brace. Patient applies brace in sitting Restrictions Weight Bearing Restrictions: No       Mobility Bed Mobility Overal bed mobility: Needs Assistance Bed Mobility: Sit to Sidelying Rolling: Min assist Sidelying to sit: Mod assist     Sit to sidelying: Max assist General bed mobility comments: Max Assist for sit to sidelying due to pain.   Transfers Overall transfer level: Needs assistance Equipment used: Rolling walker (2 wheeled) Transfers: Sit to/from [facility] Sit to Stand: Mod assist Stand pivot transfers: Min assist       General transfer comment: min A and incrased time.  Cues for proper hand placement    Balance Overall balance assessment: Needs assistance Sitting-balance support: Bilateral upper extremity supported;Feet supported Sitting balance-Leahy Scale: Poor Sitting balance - Comments: Patient required Bilateral  UE support while brace being donned.   Standing balance support: Bilateral upper extremity supported Standing balance-Leahy Scale: Poor Standing balance comment: Pt relies pushes hard through RW with UEs                   ADL               Lower Body Bathing: Maximal assistance;Sit to/from stand       Lower Body Dressing: Total assistance;Sit to/from stand                 General ADL Comments: Pt unable to access feet by crossing ankles over knees.  Discussed need for further rehab at discharge and she agrees.       Vision                     Perception     Praxis      Cognition   Behavior During Therapy: Anxious Overall Cognitive Status: Within Functional Limits for tasks assessed                       Extremity/Trunk Assessment               Exercises     Shoulder Instructions       General Comments      Pertinent Vitals/ Pain       8/10 back pain with movement.  Pt repositioned.   Home Living  Prior Functioning/Environment              Frequency Min 2X/week     Progress Toward Goals  OT Goals(current goals can now be found in the care plan section)  Progress towards OT goals: Progressing toward goals  ADL Goals Pt Will Perform Grooming: with supervision;standing Pt Will Perform Upper Body Bathing: with set-up;sitting Pt Will Perform Lower Body Bathing: with supervision;sit to/from stand;with adaptive equipment Pt Will Perform Upper Body Dressing: with supervision;sitting Pt Will Perform Lower Body Dressing: with supervision;with adaptive equipment;sit to/from stand Pt Will Transfer to Toilet: with supervision;ambulating;regular height toilet;bedside commode;grab bars Pt Will Perform Toileting - Clothing Manipulation and hygiene: with supervision;with adaptive equipment;sit to/from stand Pt Will Perform Tub/Shower Transfer: Tub transfer;with min  assist;tub bench;rolling walker;ambulating Additional ADL Goal #1: Pt will be independent with back precautions during BADLs and functional mobility   Plan Discharge plan remains appropriate    Co-evaluation                 End of Session Equipment Utilized During Treatment: Back brace;Rolling walker;Oxygen   Activity Tolerance Patient limited by pain   Patient Left in bed;with call bell/phone within reach   Nurse Communication Mobility status        Time: 7026-3785 OT Time Calculation (min): 31 min  Charges: OT General Charges $OT Visit: 1 Procedure OT Treatments $Therapeutic Activity: 23-37 mins  Kreig Parson M Roby Donaway 01/15/2014, 3:37 PM

## 2014-01-15 NOTE — Anesthesia Postprocedure Evaluation (Signed)
  Anesthesia Post-op Note  Patient: Coreena S Zadrozny  Procedure(s) Performed: Procedure(s) with comments: Thoracic Tweleve Laminectomy Transpedicular Decompression with Thoracic Ten to Lumbar Two Pedicle Screw Stabilization (N/A) - Thoracic Tweleve Laminectomy Transpedicular Decompression with Thoracic Ten to Lumbar Two Pedicle Screw Stabilization  Patient Location: PACU  Anesthesia Type:General  Level of Consciousness: awake  Airway and Oxygen Therapy: Patient Spontanous Breathing  Post-op Pain: mild  Post-op Assessment: Post-op Vital signs reviewed, Patient's Cardiovascular Status Stable, Respiratory Function Stable, Patent Airway, No signs of Nausea or vomiting and Pain level controlled  Post-op Vital Signs: Reviewed and stable  Last Vitals:  Filed Vitals:   01/15/14 0600  BP: 101/54  Pulse: 95  Temp: 37.1 C  Resp: 18    Complications: No apparent anesthesia complications

## 2014-01-15 NOTE — Progress Notes (Signed)
No issues overnight. Foley d/c'ed yesterday however pt unable to void and bladder scan demonstrated >500cc urine so Foley replaced. She has been up with assistance today.  EXAM:  BP 99/47  Pulse 117  Temp(Src) 97.8 F (36.6 C) (Oral)  Resp 20  Ht 5\' 6"  (1.676 m)  Wt 59.421 kg (131 lb)  BMI 21.15 kg/m2  SpO2 85%  Awake, alert, oriented  Speech fluent, appropriate  CN grossly intact  4/5 proximal BLE 5/5 PF 1-2/5 DF Wound c/d/i  IMPRESSION:  73 y.o. female POD#2 T12 decompression, T10-L2 stabilization - Neurologically at baseline with BLE weakness - Urinary retention postop  PLAN: - Cont to mobilize with PT/OT.  - Plan on placement in SNF when available - Keep foley for weekend, can attempt trial of void again on Monday.

## 2014-01-16 MED ORDER — FLEET ENEMA 7-19 GM/118ML RE ENEM
1.0000 | ENEMA | Freq: Once | RECTAL | Status: AC
  Administered 2014-01-16: 1 via RECTAL
  Filled 2014-01-16: qty 1

## 2014-01-16 MED ORDER — FLUCONAZOLE 200 MG PO TABS
200.0000 mg | ORAL_TABLET | ORAL | Status: DC
  Administered 2014-01-16 – 2014-01-18 (×3): 200 mg via ORAL
  Filled 2014-01-16 (×4): qty 1

## 2014-01-16 MED ORDER — MAGIC MOUTHWASH
30.0000 mL | Freq: Four times a day (QID) | ORAL | Status: DC | PRN
  Administered 2014-01-16 – 2014-01-17 (×3): 30 mL via ORAL
  Filled 2014-01-16 (×2): qty 30

## 2014-01-16 NOTE — Progress Notes (Signed)
Overall stable. Patient reports better pain control today. Still has some weakness in both feet and legs. Foley is been replaced.  Afebrile. Vital stable.  Motor and sensory examination stable. Wound clean and dry. Chest and abdomen benign.  Status post thoracic/lumbar decompression and fusion. Continue efforts at mobilization. Patient will require skilled nursing facility placement versus inpatient rehabilitation for further convalescence.

## 2014-01-17 MED ORDER — ENSURE COMPLETE PO LIQD
237.0000 mL | Freq: Two times a day (BID) | ORAL | Status: DC
  Administered 2014-01-17 – 2014-01-18 (×3): 237 mL via ORAL

## 2014-01-17 NOTE — Progress Notes (Signed)
No new issues or problems overnight. Patient's pain stable.  Afebrile. Vitals are stable. Motor examination stable. Dressing clean and dry. Chest and abdomen benign.  Progressing slowly. Continue efforts at rehabilitation.

## 2014-01-18 MED ORDER — FLUCONAZOLE 200 MG PO TABS: 200.0000 mg | ORAL_TABLET | Freq: Every day | ORAL | Status: DC

## 2014-01-18 MED ORDER — OXYCODONE-ACETAMINOPHEN 5-325 MG/5ML PO SOLN: 7.5000 mL | Freq: Four times a day (QID) | ORAL | Status: DC | PRN

## 2014-01-18 NOTE — Progress Notes (Signed)
UR complete.  Theo Reither RN, MSN 

## 2014-01-18 NOTE — Progress Notes (Signed)
Report given to [facility] from [place] in [place]. All belongs sent with patient and family. Patient appears in stable condition for discharge. Transport to facility by family.   Ave Filter, RN

## 2014-01-18 NOTE — Progress Notes (Signed)
Clinical Social Work Department BRIEF PSYCHOSOCIAL ASSESSMENT 01/18/2014  Patient:  [name], [name]     Account Number:  [id number]     Admit date:  11/01/2014  Clinical Social Worker:  Pete Pelt, [place]  Date/Time:  01/18/2014 10:41 AM  Referred by:  Physician  Date Referred:  01/16/2014 Referred for  SNF Placement   Other Referral:  A Interview type:  Patient Other interview type:    PSYCHOSOCIAL DATA Living Status:  ALONE Admitted from facility:   Level of care:   Primary support name:  Donja Tipping [phone] Primary support relationship to patient:  CHILD, ADULT Degree of support available:   Pt stated that she has a good support system from her family, friends, and neighbors.    CURRENT CONCERNS Current Concerns  Post-Acute Placement   Other Concerns:    SOCIAL WORK ASSESSMENT / PLAN CSW met with the Pt at the bedside to discuss the Pt's d/c planning to SNF facility. CSW introduced self and d/c planning role. Pt was sepaking with MD and were expecting CSW. CSW then met with the Pt for d/c planning. Pt stated that she is aware that rehabiliation is needed and is agreeable to CSW searching for SNF placement. Pt stated that "she is unsure of which county to search in as she is from [facility] and her son lives in [place]. CSW explained that CSW could Pt information to both counties and Pt could then choose when offers come in. Pt agreed to duel search. CSW will proceed to [employer] and share offers when available.   Assessment/plan status:  Information/Referral to [employer] Other assessment/ plan:   Information/referral to community resources:   CSW provided a SNF listing for the surrounding areas and will update with bed offers when available.    PATIENT'S/FAMILY'S RESPONSE TO PLAN OF CARE: Pt is unsure about which county but is leaning toward [facility]. Pt is appreciative for assistance and stated "I just  need to get better." CSW will assist with d/c planning to SNF today.        [facility]  4N 1-16;  [phone] Phone: [phone]

## 2014-01-18 NOTE — Progress Notes (Addendum)
Pt to be d/c today to [facility] in [place].   Pt and family agreeable. Confirmed plans with facility.  Plan transfer via personal vehicle. CSW confirmed from Pt that Pt feels she would be able to transport safely.   No further Needs at this time.     [facility]  4N 1-16;  [phone] Phone: [phone]

## 2014-01-18 NOTE — Discharge Summary (Signed)
Physician Discharge Summary  Patient ID: [name] MRN: [id number] DOB/AGE: Apr 27, 1941 73 y.o.  Admit date: 01/13/2014 Discharge date: 01/18/2014  Admission Diagnoses: T12 burst fracture  Discharge Diagnoses: Same Active Problems:   T12 burst fracture   Discharged Condition: Stable  Hospital Course:  [name] is a 73 y.o. female electively admitted after undergoing surgical decompression and stabilization of her T12 burst fracture suffered after a fall several weeks ago. The procedure was done without complication, and she was found to be at her neurologic baseline postoperatively. She did have bilateral lower extremity weakness both proximally and distally preop which was stable postop. She convalesced essentially without palpitation, was seen by physical and occupational therapy and was deemed a good candidate for placement of skilled nursing facility. She did have an issue with urinary retention for which a Foley was reinserted. She was otherwise stable for transfer on POD# 5, tolerating diet with pain under control.  Treatments: Surgery - T12 laminectomy and transpedicular decompression, T10-L2 stabilization  Discharge Exam: Blood pressure 123/73, pulse 81, temperature 97.4 F (36.3 C), temperature source Oral, resp. rate 18, height 5\' 6"  (1.676 m), weight 59.421 kg (131 lb), SpO2 98.00%. Awake, alert, oriented Speech fluent, appropriate CN grossly intact 4/5 bilat IP, 5/5 Knee ext, 5/5 Bilat PF, 1-2/5 bilat DF Wound c/d/i  Follow-up: Follow-up in my office [facility] [phone]) in 2-3 weeks  Disposition: 01-Home or Self Care   Future Appointments Provider Department Dept Phone   03/16/2014 9:30 AM [facility] [place] [phone]   Eat a light meal the night before the exam. Nothing to eat or drink for at least 8 hours before exam. No gum chewing, or smoking the morning of the exam. Please take your morning  medications with small sips of water, especially blood pressure medication *Very Important* Please wear 2 piece clothing   03/16/2014 10:30 AM [facility] [place] [facility] [address] [phone]   03/16/2014 11:30 AM Rosetta Posner, MD [facility] [phone]   10/28/2014 10:40 AM [place] [phone]   10/28/2014 11:00 AM [facility] [address] IMAGING [phone]   Liquids only 4 hours prior to your exam. Any medications can be taken as usual. Please arrive 15 min prior to your scheduled exam time.   11/01/2014 11:00 AM [facility] Covering Provider [place] [phone]       Medication List    STOP taking these medications       HYDROcodone-acetaminophen 5-325 MG per tablet  Commonly known as:  NORCO/VICODIN     predniSONE 20 MG tablet  Commonly known as:  DELTASONE      TAKE these medications       alendronate 70 MG tablet  Commonly known as:  FOSAMAX  Take 1 tablet (70 mg total) by mouth once a week. Take with a full glass of water on an empty stomach. Take on sundays     calcium citrate-vitamin D 315-200 MG-UNIT per tablet  Commonly known as:  CITRACAL+D  Take 1 tablet by mouth daily.     citalopram 40 MG tablet  Commonly known as:  CELEXA  Take 1 tablet (40 mg total) by mouth at bedtime.     cyclobenzaprine 10 MG tablet  Commonly known as:  FLEXERIL  Take 1 tablet (10 mg total) by mouth every 8 (eight) hours as needed for muscle spasms.     diazepam  10 MG tablet  Commonly known as:  VALIUM  Take 1 tablet (10 mg total) by mouth every 8 (eight) hours as needed (for muscle spasms).     diphenhydrAMINE 25 mg capsule  Commonly known as:  BENADRYL  Take 25 mg by mouth at bedtime as needed for sleep.     DSS 100 MG Caps  Take 100 mg by mouth 2 (two) times daily.     fish oil-omega-3 fatty acids 1000 MG capsule  Take 1 g by mouth daily.     fluconazole 200 MG tablet  Commonly known as:   DIFLUCAN  Take 1 tablet (200 mg total) by mouth daily.     ibuprofen 200 MG tablet  Commonly known as:  ADVIL,MOTRIN  Take 400 mg by mouth every 6 (six) hours as needed (for pain).     levothyroxine 25 MCG tablet  Commonly known as:  SYNTHROID, LEVOTHROID  Take 1 tablet (25 mcg total) by mouth daily before breakfast.     multivitamin with minerals Tabs tablet  Take 1 tablet by mouth daily.     oxyCODONE-acetaminophen 5-325 MG/5ML solution  Commonly known as:  ROXICET  Take 7.5 mLs by mouth every 6 (six) hours as needed for severe pain.     polyethylene glycol packet  Commonly known as:  MIRALAX / GLYCOLAX  Take 17 g by mouth 2 (two) times daily.     senna-docusate 8.6-50 MG per tablet  Commonly known as:  Senokot-S  Take 1 tablet by mouth at bedtime as needed for mild constipation.     Vitamin D-3 5000 UNITS Tabs  Take 5,000 Units by mouth daily.         SignedConsuella Lose 01/18/2014, 9:03 AM

## 2014-01-18 NOTE — Progress Notes (Signed)
No issues overnight. Pts pain is under reasonable control. She does c/o some left facial twitching occasionally but otherwise is ok.   EXAM:  BP 123/73  Pulse 81  Temp(Src) 97.4 F (36.3 C) (Oral)  Resp 18  Ht 5\' 6"  (1.676 m)  Wt 59.421 kg (131 lb)  BMI 21.15 kg/m2  SpO2 98%  Awake, alert, oriented  Speech fluent, appropriate  CN grossly intact  BLE weakness is stable Wound c/d/i  IMPRESSION:  73 y.o. female POD# 5 s/p T12 fracture decompression / stabilization - stable BLE weakness - will need placement at SNF - Uriniary retention postop requiring Foley re-insertion  PLAN: - D/C foley, attempt trial of void again. If unable, can replace foley and d/c with leg-bag to SNF - Cont mobilization with PT/OT. - Medically stable for transfer to SNF

## 2014-01-18 NOTE — Progress Notes (Addendum)
Clinical Social Work Department CLINICAL SOCIAL WORK PLACEMENT NOTE 01/18/2014  Patient:  [name], [name]  Account Number:  [id number] [place] date:  11/01/2014  Clinical Social Worker:  Pete Pelt, CLINICAL SOCIAL WORKER  Date/time:  01/18/2014 10:52 AM  Clinical Social Work is seeking post-discharge placement for this patient at the following level of care:   [place]   (*CSW will update this form in Epic as items are completed)   01/18/2014  Patient/family provided with [place] Department of Clinical Social Work's list of facilities offering this level of care within the geographic area requested by the patient (or if unable, by the patient's family).  01/18/2014  Patient/family informed of their freedom to choose among providers that offer the needed level of care, that participate in Medicare, Medicaid or managed care program needed by the patient, have an available bed and are willing to accept the patient.  01/18/2014  Patient/family informed of [facility]' ownership interest in [facility], as well as of the fact that they are under no obligation to receive care at this facility.  PASARR submitted to EDS on 01/18/2014 PASARR number received from EDS on 01/18/2014  FL2 transmitted to all facilities in geographic area requested by pt/family on  01/18/2014 FL2 transmitted to all facilities within larger geographic area on 01/18/2014  Patient informed that his/her managed care company has contracts with or will negotiate with  certain facilities, including the following:     Patient/family informed of bed offers received:  01/18/2014 Patient chooses bed at [place] Physician recommends and patient chooses bed at    Patient to be transferred to [place] on  01/18/2014 Patient to be transferred to facility by Personal Vehicle via family  The following physician request were entered in Epic:   Additional Comments:    [place]  [place] 1-16;  [id number] Phone: [phone]

## 2014-01-18 NOTE — Progress Notes (Signed)
Pt seen. Assessment to follow, however CSW received a call from the Pt and Pt has spoken with [facility] in [place] and Pt would like to be considered for that facility. Facility stated they could possibly take Pt today and will work on authorization. Facility will contact CSW as soon as Josem Kaufmann is received.     CSW will continue to follow Pt for d/c planning.    [facility]  4N 1-16;  [phone] Phone: [phone]

## 2014-02-18 ENCOUNTER — Encounter: Payer: Self-pay | Admitting: Family Medicine

## 2014-02-18 NOTE — Telephone Encounter (Signed)
This encounter was created in error - please disregard.

## 2014-02-18 NOTE — Telephone Encounter (Signed)
When that pt was in the rehab place they doubled her levothyroxine (SYNTHROID, LEVOTHROID) 25 MCG tablet  I think she has a question about it  [phone]

## 2014-03-09 ENCOUNTER — Encounter: Payer: Self-pay | Admitting: Family Medicine

## 2014-03-09 ENCOUNTER — Ambulatory Visit: Payer: Medicare Other | Admitting: Vascular Surgery

## 2014-03-09 ENCOUNTER — Telehealth: Payer: Self-pay | Admitting: Family Medicine

## 2014-03-09 ENCOUNTER — Other Ambulatory Visit (HOSPITAL_COMMUNITY): Payer: Medicare Other

## 2014-03-09 NOTE — Telephone Encounter (Signed)
Discontinue O2 letter sent to [facility].  Left pt message that they will be coming to pick up equipment

## 2014-03-09 NOTE — Telephone Encounter (Signed)
ok 

## 2014-03-09 NOTE — Telephone Encounter (Signed)
Pt has had Oxygen since leaving hospital.  She has not been using.  It is costing her over $50/mth to have.  [place]  nurse states her respiration and O2 levels have been fine.  Can we send D/C order to DME company.  [facility]  Fax # [phone]

## 2014-03-15 ENCOUNTER — Ambulatory Visit (INDEPENDENT_AMBULATORY_CARE_PROVIDER_SITE_OTHER): Payer: Medicare HMO | Admitting: Family Medicine

## 2014-03-15 ENCOUNTER — Encounter: Payer: Self-pay | Admitting: Family Medicine

## 2014-03-15 ENCOUNTER — Encounter: Payer: Self-pay | Admitting: Vascular Surgery

## 2014-03-15 VITALS — BP 90/60 | HR 82 | Temp 96.9°F | Resp 18 | Ht 66.0 in | Wt 125.0 lb

## 2014-03-15 DIAGNOSIS — Z13220 Encounter for screening for lipoid disorders: Secondary | ICD-10-CM

## 2014-03-15 DIAGNOSIS — Z09 Encounter for follow-up examination after completed treatment for conditions other than malignant neoplasm: Secondary | ICD-10-CM

## 2014-03-15 DIAGNOSIS — R300 Dysuria: Secondary | ICD-10-CM

## 2014-03-15 LAB — LIPID PANEL
Cholesterol: 248 mg/dL — ABNORMAL HIGH (ref 0–200)
HDL: 62 mg/dL (ref >39)
LDL Cholesterol: 163 mg/dL — ABNORMAL HIGH (ref 0–99)
Total CHOL/HDL Ratio: 4 Ratio
Triglycerides: 115 mg/dL (ref <150)
VLDL: 23 mg/dL (ref 0–40)

## 2014-03-15 LAB — URINALYSIS, MICROSCOPIC ONLY
Casts: NONE SEEN
Crystals: NONE SEEN

## 2014-03-15 LAB — URINALYSIS, ROUTINE W REFLEX MICROSCOPIC
Bilirubin Urine: NEGATIVE
Glucose, UA: NEGATIVE mg/dL
Ketones, ur: NEGATIVE mg/dL
Leukocytes, UA: NEGATIVE
Nitrite: NEGATIVE
Protein, ur: NEGATIVE mg/dL
Specific Gravity, Urine: 1.02 (ref 1.005–1.030)
Urobilinogen, UA: 0.2 mg/dL (ref 0.0–1.0)
pH: 6 (ref 5.0–8.0)

## 2014-03-15 LAB — CBC WITH DIFFERENTIAL/PLATELET
Basophils Absolute: 0.1 10*3/uL (ref 0.0–0.1)
Basophils Relative: 1 % (ref 0–1)
Eosinophils Absolute: 0.2 10*3/uL (ref 0.0–0.7)
Eosinophils Relative: 3 % (ref 0–5)
HCT: 40.5 % (ref 36.0–46.0)
Hemoglobin: 13.4 g/dL (ref 12.0–15.0)
Lymphocytes Relative: 27 % (ref 12–46)
Lymphs Abs: 1.6 10*3/uL (ref 0.7–4.0)
MCH: 29.3 pg (ref 26.0–34.0)
MCHC: 33.1 g/dL (ref 30.0–36.0)
MCV: 88.4 fL (ref 78.0–100.0)
Monocytes Absolute: 0.4 10*3/uL (ref 0.1–1.0)
Monocytes Relative: 7 % (ref 3–12)
Neutro Abs: 3.7 10*3/uL (ref 1.7–7.7)
Neutrophils Relative %: 62 % (ref 43–77)
Platelets: 337 10*3/uL (ref 150–400)
RBC: 4.58 MIL/uL (ref 3.87–5.11)
RDW: 13.5 % (ref 11.5–15.5)
WBC: 5.9 10*3/uL (ref 4.0–10.5)

## 2014-03-15 LAB — COMPLETE METABOLIC PANEL WITH GFR
ALT: 10 U/L (ref 0–35)
AST: 17 U/L (ref 0–37)
Albumin: 4.2 g/dL (ref 3.5–5.2)
Alkaline Phosphatase: 68 U/L (ref 39–117)
BUN: 10 mg/dL (ref 6–23)
CO2: 30 mEq/L (ref 19–32)
Calcium: 9.7 mg/dL (ref 8.4–10.5)
Chloride: 101 mEq/L (ref 96–112)
Creat: 0.52 mg/dL (ref 0.50–1.10)
GFR, Est African American: >89 mL/min
GFR, Est Non African American: >89 mL/min
Glucose, Bld: 76 mg/dL (ref 70–99)
Potassium: 4.9 mEq/L (ref 3.5–5.3)
Sodium: 137 mEq/L (ref 135–145)
Total Bilirubin: 0.4 mg/dL (ref 0.2–1.2)
Total Protein: 6.9 g/dL (ref 6.0–8.3)

## 2014-03-15 LAB — TSH: TSH: 4.226 u[IU]/mL (ref 0.350–4.500)

## 2014-03-15 NOTE — Addendum Note (Signed)
Addended by: WRAY, [place] on: 03/15/2014 12:52 PM   Modules accepted: Orders

## 2014-03-15 NOTE — Progress Notes (Signed)
Subjective:    Patient ID: [name], female    DOB: 13-Mar-1941, 73 y.o.   MRN: [id number]  HPI  Patient was admitted to the hospital for surgery in April for a T12 burst vertebral fracture.  I have copied relevant portions of the discharge summary below: Admit date: 01/13/2014  Discharge date: 01/18/2014  Admission Diagnoses: T12 burst fracture  Discharge Diagnoses: Same  Active Problems:  T12 burst fracture  Discharged Condition: Stable  Hospital Course:  [name] is a 73 y.o. female electively admitted after undergoing surgical decompression and stabilization of her T12 burst fracture suffered after a fall several weeks ago. The procedure was done without complication, and she was found to be at her neurologic baseline postoperatively. She did have bilateral lower extremity weakness both proximally and distally preop which was stable postop. She convalesced essentially without palpitation, was seen by physical and occupational therapy and was deemed a good candidate for placement of skilled nursing facility. She did have an issue with urinary retention for which a Foley was reinserted. She was otherwise stable for transfer on POD# 5, tolerating diet with pain under control.  Treatments: Surgery - T12 laminectomy and transpedicular decompression, T10-L2 stabilization  Discharge Exam:  Blood pressure 123/73, pulse 81, temperature 97.4 F (36.3 C), temperature source Oral, resp. rate 18, height 5\' 6"  (1.676 m), weight 59.421 kg (131 lb), SpO2 98.00%.  Awake, alert, oriented  Speech fluent, appropriate  CN grossly intact  4/5 bilat IP, 5/5 Knee ext, 5/5 Bilat PF, 1-2/5 bilat DF  Wound c/d/i  Follow-up: Follow-up in my office [facility] [phone]) in 2-3 weeks  Disposition: 01-Home or Self Care  Patient has now been home for 3 weeks.  She is requiring very little pain medication. Physical therapy is coming to her home health from rehabilitation. She is  voiding without difficulty. She requiring stool softeners but is going to the bathroom a daily basis. She denies any medical problems other than dysuria and foul-smelling urine. This is been going on for the last 2 days. She denies any hematuria or fevers. Past Medical History  Diagnosis Date  . COPD (chronic obstructive pulmonary disease)   . Anxiety disorder   . Hypothyroidism   . PUD (peptic ulcer disease)     67yrs  . Dysphagia   . IBS (irritable bowel syndrome)   . Diverticulosis   . Trigeminal neuralgia   . Diastolic dysfunction   . Allergic rhinitis   . 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 Hiatal hernia   . Aortic aneurysm   . Complication of anesthesia 2011    bloodpressure dropped during colonoscopy, not problems since  . Shortness of breath     with exertion  . Aneurysm of carotid artery     corrected by surgery 08/21/13  . Compression fracture   . On home O2 12/15/2013    chronic hypoxia  . Depression    Current Outpatient Prescriptions on File Prior to Visit  Medication Sig Dispense Refill  . alendronate (FOSAMAX) 70 MG tablet Take 1 tablet (70 mg total) by mouth once a week. Take with a full glass of water on an empty stomach. Take on sundays  12 tablet  4  . calcium citrate-vitamin D (CITRACAL+D) 315-200 MG-UNIT per tablet Take 1 tablet by mouth daily.       . Cholecalciferol (VITAMIN D-3) 5000 UNITS TABS Take 5,000 Units by mouth daily.       Marland Kitchen  citalopram (CELEXA) 40 MG tablet Take 1 tablet (40 mg total) by mouth at bedtime.  90 tablet  4  . cyclobenzaprine (FLEXERIL) 10 MG tablet Take 1 tablet (10 mg total) by mouth every 8 (eight) hours as needed for muscle spasms.  30 tablet  0  . diphenhydrAMINE (BENADRYL) 25 mg capsule Take 25 mg by mouth at bedtime as needed for sleep.      Marland Kitchen docusate sodium 100 MG CAPS Take 100 mg by mouth 2 (two) times daily.  10 capsule  0  . fish oil-omega-3 fatty acids 1000 MG capsule Take 1 g by mouth daily.       Marland Kitchen  ibuprofen (ADVIL,MOTRIN) 200 MG tablet Take 400 mg by mouth every 6 (six) hours as needed (for pain).      Marland Kitchen levothyroxine (SYNTHROID, LEVOTHROID) 25 MCG tablet Take 1 tablet (25 mcg total) by mouth daily before breakfast.  90 tablet  3  . Multiple Vitamin (MULTIVITAMIN WITH MINERALS) TABS Take 1 tablet by mouth daily.      . polyethylene glycol (MIRALAX / GLYCOLAX) packet Take 17 g by mouth 2 (two) times daily.  14 each  0  . senna-docusate (SENOKOT-S) 8.6-50 MG per tablet Take 1 tablet by mouth at bedtime as needed for mild constipation.      . diazepam (VALIUM) 10 MG tablet Take 1 tablet (10 mg total) by mouth every 8 (eight) hours as needed (for muscle spasms).  30 tablet  0   No current facility-administered medications on file prior to visit.   Allergies  Allergen Reactions  . Ciprofloxacin Hcl Hives  . Iohexol      Desc: Per [facility], pt is allergic to IV contrast, no type of reaction was available. Pt cannot remember but first reacted around 15 yrs ago from an IVP. Notes were date from 2009 at urology center., Onset Date: [id number]   . Prozac [Fluoxetine Hcl] Rash  . Sulfonamide Derivatives Hives and Rash   History   Social History  . Marital Status: Divorced    Spouse Name: N/A    Number of Children: N/A  . Years of Education: N/A   Occupational History  . Not on file.   Social History Main Topics  . Smoking status: Former Smoker -- 0.50 packs/day for 20 years    Types: Cigarettes    Quit date: 12/21/2010  . Smokeless tobacco: Never Used     Comment: smoking cessation info given and reviewed   . Alcohol Use: Yes     Comment: seldom  . Drug Use: No  . Sexual Activity: Yes    Birth Control/ Protection: Surgical   Other Topics Concern  . Not on file   Social History Narrative  . No narrative on file      Review of Systems  All other systems reviewed and are negative.      Objective:   Physical Exam  Vitals reviewed. Constitutional: She is  oriented to person, place, and time. She appears well-developed and well-nourished.  Neck: Neck supple. No JVD present. No thyromegaly present.  Cardiovascular: Normal rate, regular rhythm, normal heart sounds and intact distal pulses.   Pulmonary/Chest: Effort normal and breath sounds normal. No respiratory distress. She has no wheezes. She has no rales. She exhibits no tenderness.  Abdominal: Soft. Bowel sounds are normal. She exhibits no distension. There is no tenderness. There is no rebound and no guarding.  Musculoskeletal: She exhibits no edema.  Lymphadenopathy:    She has  no cervical adenopathy.  Neurological: She is alert and oriented to person, place, and time. She has normal reflexes. She displays normal reflexes. No cranial nerve deficit. She exhibits normal muscle tone. Coordination normal.  Psychiatric: She has a normal mood and affect. Her behavior is normal. Judgment and thought content normal.          Assessment & Plan:  1. Hospital discharge follow-up Patient is overdue for fasting lab work including a CBC, CMP, TSH to evaluate her hypothyroidism, and a fasting lipid panel.  Otherwise she is doing well. Her pain is well controlled. She is progressing with her physical therapy. The remainder of her review of systems is negative. - COMPLETE METABOLIC PANEL WITH GFR - TSH - CBC with Differential  2. Dysuria I will check urinalysis rule out urinary tract infection. Urinalysis only shows a trace of blood but no evidence of UTI. Therefore no indication for treatment. - Urinalysis, Routine w reflex microscopic  3. Screening cholesterol level - Lipid panel

## 2014-03-16 ENCOUNTER — Ambulatory Visit (HOSPITAL_COMMUNITY)
Admission: RE | Admit: 2014-03-16 | Discharge: 2014-03-16 | Disposition: A | Discharge status: Home / Self Care | Payer: Medicare HMO | Source: Ambulatory Visit | Attending: Vascular Surgery | Admitting: Vascular Surgery

## 2014-03-16 ENCOUNTER — Ambulatory Visit (INDEPENDENT_AMBULATORY_CARE_PROVIDER_SITE_OTHER): Payer: Medicare HMO | Admitting: Vascular Surgery

## 2014-03-16 ENCOUNTER — Encounter: Payer: Self-pay | Admitting: Vascular Surgery

## 2014-03-16 ENCOUNTER — Ambulatory Visit (INDEPENDENT_AMBULATORY_CARE_PROVIDER_SITE_OTHER)
Admission: RE | Admit: 2014-03-16 | Discharge: 2014-03-16 | Disposition: A | Discharge status: Home / Self Care | Payer: Medicare HMO | Source: Ambulatory Visit | Attending: Vascular Surgery | Admitting: Vascular Surgery

## 2014-03-16 VITALS — BP 107/69 | HR 69 | Resp 14 | Ht 65.0 in | Wt 121.4 lb

## 2014-03-16 DIAGNOSIS — I714 Abdominal aortic aneurysm, without rupture, unspecified: Secondary | ICD-10-CM

## 2014-03-16 DIAGNOSIS — I720 Aneurysm of carotid artery: Secondary | ICD-10-CM

## 2014-03-16 DIAGNOSIS — I6529 Occlusion and stenosis of unspecified carotid artery: Secondary | ICD-10-CM

## 2014-03-16 DIAGNOSIS — Z48812 Encounter for surgical aftercare following surgery on the circulatory system: Secondary | ICD-10-CM

## 2014-03-16 LAB — URINE CULTURE
Colony Count: NO GROWTH
Organism ID, Bacteria: NO GROWTH

## 2014-03-16 NOTE — Progress Notes (Signed)
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 Since my last visit with her she has suffered a fall and a spinal fracture requiring instrumentation. She is walking with a walker and is slowly recovering from this.  Past Medical History  Diagnosis Date  . COPD (chronic obstructive pulmonary disease)   . Anxiety disorder   . Hypothyroidism   . PUD (peptic ulcer disease)     21yrs  . Dysphagia   . IBS (irritable bowel syndrome)   . Diverticulosis   . Trigeminal neuralgia   . Diastolic dysfunction   . Allergic rhinitis   . 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 Hiatal hernia   . Aortic aneurysm   . Complication of anesthesia 2011    bloodpressure dropped during colonoscopy, not problems since  . Shortness of breath     with exertion  . Aneurysm of carotid artery     corrected by surgery 08/21/13  . Compression fracture   . On home O2 12/15/2013    chronic hypoxia  . Depression     History  Substance Use Topics  . Smoking status: Former Smoker -- 0.50 packs/day for 20 years    Types: Cigarettes    Quit date: 12/21/2010  . Smokeless tobacco: Never Used     Comment: smoking cessation info given and reviewed   . Alcohol Use: Yes     Comment: seldom    Family History  Problem Relation Age of Onset  . Liver cancer Sister   . Cancer Sister   . Pulmonary embolism Mother   . Heart attack Father   . Hypertension Father   . Cancer Brother   . Cancer Daughter     Allergies  Allergen Reactions  . Ciprofloxacin Hcl Hives  . Iohexol      Desc: Per [facility], pt is allergic to IV contrast, no type of reaction was available. Pt cannot remember but first reacted around 15 yrs ago from an IVP. Notes were date from 2009 at urology center., Onset Date: [id number]   . Prozac [Fluoxetine Hcl] Rash  . Sulfonamide Derivatives Hives and Rash     Current outpatient prescriptions:alendronate (FOSAMAX) 70 MG tablet, Take 1 tablet (70 mg total) by mouth once a week. Take with a full glass of water on an empty stomach. Take on sundays, Disp: 12 tablet, Rfl: 4;  calcium citrate-vitamin D (CITRACAL+D) 315-200 MG-UNIT per tablet, Take 1 tablet by mouth daily. , Disp: , Rfl: ;  Cholecalciferol (VITAMIN D-3) 5000 UNITS TABS, Take 5,000 Units by mouth daily. , Disp: , Rfl:  citalopram (CELEXA) 40 MG tablet, Take 1 tablet (40 mg total) by mouth at bedtime., Disp: 90 tablet, Rfl: 4;  fish oil-omega-3 fatty acids 1000 MG capsule, Take 1 g by mouth daily. , Disp: , Rfl: ;  ibuprofen (ADVIL,MOTRIN) 200 MG tablet, Take 400 mg by mouth every 6 (six) hours as needed (for pain)., Disp: , Rfl:  levothyroxine (SYNTHROID, LEVOTHROID) 25 MCG tablet, Take 1 tablet (25 mcg total) by mouth daily before breakfast., Disp: 90 tablet, Rfl: 3;  Multiple Vitamin (MULTIVITAMIN WITH MINERALS) TABS, Take 1 tablet by mouth daily., Disp: , Rfl: ;  polyethylene glycol (MIRALAX / GLYCOLAX) packet, Take 17 g by mouth 2 (two) times daily., Disp: 14 each, Rfl: 0 senna-docusate (SENOKOT-S) 8.6-50 MG per tablet, Take 1 tablet by mouth at bedtime as  needed for mild constipation., Disp: , Rfl: ;  cyclobenzaprine (FLEXERIL) 10 MG tablet, Take 1 tablet (10 mg total) by mouth every 8 (eight) hours as needed for muscle spasms., Disp: 30 tablet, Rfl: 0;  diazepam (VALIUM) 10 MG tablet, Take 1 tablet (10 mg total) by mouth every 8 (eight) hours as needed (for muscle spasms)., Disp: 30 tablet, Rfl: 0 diphenhydrAMINE (BENADRYL) 25 mg capsule, Take 25 mg by mouth at bedtime as needed for sleep., Disp: , Rfl: ;  docusate sodium 100 MG CAPS, Take 100 mg by mouth 2 (two) times daily., Disp: 10 capsule, Rfl: 0  BP 107/69  Pulse 69  Resp 14  Ht 5\' 5"  (1.651 m)  Wt 121 lb 6.4 oz (55.067 kg)  BMI 20.20 kg/m2  Body mass index is 20.2 kg/(m^2).       Physical exam well-developed well-nourished  thin white female in no acute distress Her right neck incision is well-healed she has no bruits present Abdomen is soft with non-tense no tenderness and no evidence of large aneurysm Grossly intact neurologically Skin without ulcers or rashes  Carotid duplex today was reviewed with the patient in our office. This shows no evidence of stenosis at the resection site and no evidence of recurrent aneurysm in her carotid artery  Duplex of her aorta reveals no enlargement with an aneurysm is quite small at 3.2 cm  Impression and plan stable followup of resection of carotid aneurysm November 2014 and stable followup of small aneurysm. We will see her again in one year for repeat carotid and aortic duplex.

## 2014-03-16 NOTE — Addendum Note (Signed)
Addended by: Mena Goes on: 03/16/2014 04:27 PM   Modules accepted: Orders

## 2014-03-18 ENCOUNTER — Encounter: Payer: Self-pay | Admitting: Family Medicine

## 2014-03-18 MED ORDER — ROPINIROLE HCL 0.5 MG PO TABS: 0.5000 mg | ORAL_TABLET | Freq: Three times a day (TID) | ORAL | Status: DC

## 2014-03-18 MED ORDER — ATORVASTATIN CALCIUM 20 MG PO TABS: 20.0000 mg | ORAL_TABLET | Freq: Every day | ORAL | Status: DC

## 2014-03-19 ENCOUNTER — Telehealth: Payer: Self-pay | Admitting: *Deleted

## 2014-03-19 NOTE — Telephone Encounter (Signed)
Agnes from [employer] called inquiring about pts medications Flexeril, states high risk medication for elderly pts, but pt stated that she is not taking but as needed, Citalopram pt is on 40 mg and agnes states she is taking too much max recommended dose should be 20mg . And diazepam states it is harmful to elderly, we went over the med list for confirmation and she states will get to pharmacist and let him know exactly what pt is taking and what the medications is for.

## 2014-03-25 ENCOUNTER — Encounter: Payer: Self-pay | Admitting: Family Medicine

## 2014-03-26 ENCOUNTER — Encounter: Payer: Self-pay | Admitting: Family Medicine

## 2014-03-26 ENCOUNTER — Other Ambulatory Visit: Payer: Self-pay | Admitting: Family Medicine

## 2014-03-26 NOTE — Telephone Encounter (Signed)
Refill appropriate and filled per protocol. 

## 2014-04-08 ENCOUNTER — Observation Stay (HOSPITAL_COMMUNITY)
Admission: EM | Admit: 2014-04-08 | Discharge: 2014-04-10 | Disposition: A | Discharge status: Home Health | Payer: Medicare HMO | Attending: Orthopedic Surgery | Admitting: Orthopedic Surgery

## 2014-04-08 ENCOUNTER — Emergency Department (HOSPITAL_COMMUNITY): Payer: Medicare HMO

## 2014-04-08 ENCOUNTER — Encounter (HOSPITAL_COMMUNITY): Payer: Self-pay | Admitting: Emergency Medicine

## 2014-04-08 DIAGNOSIS — E871 Hypo-osmolality and hyponatremia: Secondary | ICD-10-CM | POA: Insufficient documentation

## 2014-04-08 DIAGNOSIS — S0083XA Contusion of other part of head, initial encounter: Secondary | ICD-10-CM | POA: Insufficient documentation

## 2014-04-08 DIAGNOSIS — Z9981 Dependence on supplemental oxygen: Secondary | ICD-10-CM | POA: Insufficient documentation

## 2014-04-08 DIAGNOSIS — C3490 Malignant neoplasm of unspecified part of unspecified bronchus or lung: Secondary | ICD-10-CM

## 2014-04-08 DIAGNOSIS — J9610 Chronic respiratory failure, unspecified whether with hypoxia or hypercapnia: Secondary | ICD-10-CM | POA: Insufficient documentation

## 2014-04-08 DIAGNOSIS — I76 Septic arterial embolism: Secondary | ICD-10-CM | POA: Insufficient documentation

## 2014-04-08 DIAGNOSIS — J4489 Other specified chronic obstructive pulmonary disease: Secondary | ICD-10-CM | POA: Insufficient documentation

## 2014-04-08 DIAGNOSIS — I714 Abdominal aortic aneurysm, without rupture, unspecified: Secondary | ICD-10-CM | POA: Insufficient documentation

## 2014-04-08 DIAGNOSIS — Z85118 Personal history of other malignant neoplasm of bronchus and lung: Secondary | ICD-10-CM | POA: Insufficient documentation

## 2014-04-08 DIAGNOSIS — S62109A Fracture of unspecified carpal bone, unspecified wrist, initial encounter for closed fracture: Secondary | ICD-10-CM

## 2014-04-08 DIAGNOSIS — J449 Chronic obstructive pulmonary disease, unspecified: Secondary | ICD-10-CM | POA: Insufficient documentation

## 2014-04-08 DIAGNOSIS — J9621 Acute and chronic respiratory failure with hypoxia: Secondary | ICD-10-CM

## 2014-04-08 DIAGNOSIS — F411 Generalized anxiety disorder: Secondary | ICD-10-CM | POA: Insufficient documentation

## 2014-04-08 DIAGNOSIS — J42 Unspecified chronic bronchitis: Secondary | ICD-10-CM

## 2014-04-08 DIAGNOSIS — S52509A Unspecified fracture of the lower end of unspecified radius, initial encounter for closed fracture: Principal | ICD-10-CM | POA: Insufficient documentation

## 2014-04-08 DIAGNOSIS — I739 Peripheral vascular disease, unspecified: Secondary | ICD-10-CM | POA: Insufficient documentation

## 2014-04-08 DIAGNOSIS — K449 Diaphragmatic hernia without obstruction or gangrene: Secondary | ICD-10-CM | POA: Insufficient documentation

## 2014-04-08 DIAGNOSIS — I519 Heart disease, unspecified: Secondary | ICD-10-CM | POA: Insufficient documentation

## 2014-04-08 DIAGNOSIS — F329 Major depressive disorder, single episode, unspecified: Secondary | ICD-10-CM | POA: Insufficient documentation

## 2014-04-08 DIAGNOSIS — M62838 Other muscle spasm: Secondary | ICD-10-CM | POA: Insufficient documentation

## 2014-04-08 DIAGNOSIS — K5900 Constipation, unspecified: Secondary | ICD-10-CM | POA: Insufficient documentation

## 2014-04-08 DIAGNOSIS — F3289 Other specified depressive episodes: Secondary | ICD-10-CM | POA: Insufficient documentation

## 2014-04-08 DIAGNOSIS — K5730 Diverticulosis of large intestine without perforation or abscess without bleeding: Secondary | ICD-10-CM | POA: Insufficient documentation

## 2014-04-08 DIAGNOSIS — S52609A Unspecified fracture of lower end of unspecified ulna, initial encounter for closed fracture: Principal | ICD-10-CM

## 2014-04-08 DIAGNOSIS — W19XXXA Unspecified fall, initial encounter: Secondary | ICD-10-CM

## 2014-04-08 DIAGNOSIS — Z79899 Other long term (current) drug therapy: Secondary | ICD-10-CM | POA: Insufficient documentation

## 2014-04-08 DIAGNOSIS — W101XXA Fall (on)(from) sidewalk curb, initial encounter: Secondary | ICD-10-CM | POA: Insufficient documentation

## 2014-04-08 DIAGNOSIS — Z902 Acquired absence of lung [part of]: Secondary | ICD-10-CM | POA: Insufficient documentation

## 2014-04-08 DIAGNOSIS — I5189 Other ill-defined heart diseases: Secondary | ICD-10-CM

## 2014-04-08 DIAGNOSIS — S1093XA Contusion of unspecified part of neck, initial encounter: Secondary | ICD-10-CM

## 2014-04-08 DIAGNOSIS — E039 Hypothyroidism, unspecified: Secondary | ICD-10-CM | POA: Insufficient documentation

## 2014-04-08 DIAGNOSIS — K589 Irritable bowel syndrome without diarrhea: Secondary | ICD-10-CM | POA: Insufficient documentation

## 2014-04-08 DIAGNOSIS — Z87891 Personal history of nicotine dependence: Secondary | ICD-10-CM | POA: Insufficient documentation

## 2014-04-08 DIAGNOSIS — S0003XA Contusion of scalp, initial encounter: Secondary | ICD-10-CM | POA: Insufficient documentation

## 2014-04-08 DIAGNOSIS — I9589 Other hypotension: Secondary | ICD-10-CM | POA: Insufficient documentation

## 2014-04-08 DIAGNOSIS — D62 Acute posthemorrhagic anemia: Secondary | ICD-10-CM | POA: Insufficient documentation

## 2014-04-08 DIAGNOSIS — S62102B Fracture of unspecified carpal bone, left wrist, initial encounter for open fracture: Secondary | ICD-10-CM

## 2014-04-08 NOTE — ED Notes (Signed)
Pt. missed her step and fell on pavement this evening , denies LOC , alert and oriented / respirations unlabored , presents with pain / swelling /deformity at left wrist.

## 2014-04-09 ENCOUNTER — Encounter (HOSPITAL_COMMUNITY)
Admission: EM | Disposition: A | Discharge status: Home Health | Payer: Self-pay | Source: Home / Self Care | Attending: Emergency Medicine

## 2014-04-09 ENCOUNTER — Emergency Department (HOSPITAL_COMMUNITY): Payer: Medicare HMO

## 2014-04-09 ENCOUNTER — Encounter (HOSPITAL_COMMUNITY): Payer: Self-pay | Admitting: Anesthesiology

## 2014-04-09 ENCOUNTER — Observation Stay (HOSPITAL_COMMUNITY): Payer: Medicare HMO

## 2014-04-09 ENCOUNTER — Observation Stay (HOSPITAL_COMMUNITY): Payer: Medicare HMO | Admitting: Anesthesiology

## 2014-04-09 ENCOUNTER — Encounter (HOSPITAL_COMMUNITY): Payer: Medicare HMO | Admitting: Anesthesiology

## 2014-04-09 DIAGNOSIS — C3490 Malignant neoplasm of unspecified part of unspecified bronchus or lung: Secondary | ICD-10-CM

## 2014-04-09 DIAGNOSIS — S62109A Fracture of unspecified carpal bone, unspecified wrist, initial encounter for closed fracture: Secondary | ICD-10-CM

## 2014-04-09 DIAGNOSIS — W19XXXA Unspecified fall, initial encounter: Secondary | ICD-10-CM

## 2014-04-09 HISTORY — PX: ORIF WRIST FRACTURE: SHX2133

## 2014-04-09 HISTORY — PX: PERCUTANEOUS PINNING: SHX2209

## 2014-04-09 LAB — BASIC METABOLIC PANEL
Anion gap: 14 (ref 5–15)
BUN: 12 mg/dL (ref 6–23)
CO2: 24 mEq/L (ref 19–32)
Calcium: 9.8 mg/dL (ref 8.4–10.5)
Chloride: 98 mEq/L (ref 96–112)
Creatinine, Ser: 0.46 mg/dL — ABNORMAL LOW (ref 0.50–1.10)
GFR calc Af Amer: >90 mL/min (ref >90)
GFR calc non Af Amer: >90 mL/min (ref >90)
Glucose, Bld: 107 mg/dL — ABNORMAL HIGH (ref 70–99)
Potassium: 4.4 mEq/L (ref 3.7–5.3)
Sodium: 136 mEq/L — ABNORMAL LOW (ref 137–147)

## 2014-04-09 LAB — CBC
HCT: 37.6 % (ref 36.0–46.0)
Hemoglobin: 12.5 g/dL (ref 12.0–15.0)
MCH: 28.8 pg (ref 26.0–34.0)
MCHC: 33.2 g/dL (ref 30.0–36.0)
MCV: 86.6 fL (ref 78.0–100.0)
Platelets: 315 10*3/uL (ref 150–400)
RBC: 4.34 MIL/uL (ref 3.87–5.11)
RDW: 13.3 % (ref 11.5–15.5)
WBC: 10 10*3/uL (ref 4.0–10.5)

## 2014-04-09 LAB — URINALYSIS, ROUTINE W REFLEX MICROSCOPIC
Bilirubin Urine: NEGATIVE
Glucose, UA: NEGATIVE mg/dL
Hgb urine dipstick: NEGATIVE
Ketones, ur: NEGATIVE mg/dL
Leukocytes, UA: NEGATIVE
Nitrite: NEGATIVE
Protein, ur: NEGATIVE mg/dL
Specific Gravity, Urine: 1.016 (ref 1.005–1.030)
Urobilinogen, UA: 0.2 mg/dL (ref 0.0–1.0)
pH: 6 (ref 5.0–8.0)

## 2014-04-09 SURGERY — OPEN REDUCTION INTERNAL FIXATION (ORIF) WRIST FRACTURE
Anesthesia: Monitor Anesthesia Care | Laterality: Left

## 2014-04-09 MED ORDER — IPRATROPIUM-ALBUTEROL 0.5-2.5 (3) MG/3ML IN SOLN
3.0000 mL | Freq: Four times a day (QID) | RESPIRATORY_TRACT | Status: DC
  Administered 2014-04-09: 3 mL via RESPIRATORY_TRACT
  Filled 2014-04-09: qty 3

## 2014-04-09 MED ORDER — METOCLOPRAMIDE HCL 5 MG/ML IJ SOLN: 5.0000 mg | Freq: Three times a day (TID) | INTRAMUSCULAR | Status: DC | PRN

## 2014-04-09 MED ORDER — METOCLOPRAMIDE HCL 10 MG PO TABS: 5.0000 mg | ORAL_TABLET | Freq: Three times a day (TID) | ORAL | Status: DC | PRN

## 2014-04-09 MED ORDER — OXYCODONE HCL 5 MG PO TABS
10.0000 mg | ORAL_TABLET | ORAL | Status: DC | PRN
  Administered 2014-04-09 – 2014-04-10 (×5): 10 mg via ORAL
  Filled 2014-04-09 (×4): qty 2

## 2014-04-09 MED ORDER — TETANUS-DIPHTHERIA TOXOIDS TD 5-2 LFU IM INJ
0.5000 mL | INJECTION | Freq: Once | INTRAMUSCULAR | Status: DC
  Filled 2014-04-09: qty 0.5

## 2014-04-09 MED ORDER — PHENYLEPHRINE HCL 10 MG/ML IJ SOLN
INTRAMUSCULAR | Status: DC | PRN
  Administered 2014-04-09 (×5): 80 ug via INTRAVENOUS

## 2014-04-09 MED ORDER — SODIUM CHLORIDE 0.9 % IV BOLUS (SEPSIS)
1000.0000 mL | Freq: Once | INTRAVENOUS | Status: AC
  Administered 2014-04-09: 1000 mL via INTRAVENOUS

## 2014-04-09 MED ORDER — FENTANYL CITRATE 0.05 MG/ML IJ SOLN
INTRAMUSCULAR | Status: AC
  Filled 2014-04-09: qty 5

## 2014-04-09 MED ORDER — FENTANYL CITRATE 0.05 MG/ML IJ SOLN
50.0000 ug | Freq: Once | INTRAMUSCULAR | Status: AC
  Administered 2014-04-09: 50 ug via INTRAVENOUS
  Filled 2014-04-09: qty 2

## 2014-04-09 MED ORDER — TETANUS-DIPHTH-ACELL PERTUSSIS 5-2.5-18.5 LF-MCG/0.5 IM SUSP
0.5000 mL | Freq: Once | INTRAMUSCULAR | Status: AC
  Administered 2014-04-09: 0.5 mL via INTRAMUSCULAR

## 2014-04-09 MED ORDER — LEVOTHYROXINE SODIUM 25 MCG PO TABS
25.0000 ug | ORAL_TABLET | Freq: Every day | ORAL | Status: DC
  Administered 2014-04-10: 25 ug via ORAL
  Filled 2014-04-09 (×3): qty 1

## 2014-04-09 MED ORDER — FENTANYL CITRATE 0.05 MG/ML IJ SOLN
50.0000 ug | Freq: Once | INTRAMUSCULAR | Status: AC
  Administered 2014-04-09: 50 ug via INTRAVENOUS

## 2014-04-09 MED ORDER — LIDOCAINE HCL (CARDIAC) 20 MG/ML IV SOLN
INTRAVENOUS | Status: DC | PRN
  Administered 2014-04-09: 30 mg via INTRAVENOUS

## 2014-04-09 MED ORDER — FENTANYL CITRATE 0.05 MG/ML IJ SOLN
INTRAMUSCULAR | Status: AC
  Administered 2014-04-09: 50 ug via INTRAVENOUS
  Filled 2014-04-09: qty 2

## 2014-04-09 MED ORDER — ROPINIROLE HCL 0.5 MG PO TABS
0.5000 mg | ORAL_TABLET | Freq: Three times a day (TID) | ORAL | Status: DC
  Administered 2014-04-09 – 2014-04-10 (×2): 0.5 mg via ORAL
  Filled 2014-04-09 (×6): qty 1

## 2014-04-09 MED ORDER — CEFAZOLIN SODIUM-DEXTROSE 2-3 GM-% IV SOLR
INTRAVENOUS | Status: DC | PRN
  Administered 2014-04-09: 2 g via INTRAVENOUS

## 2014-04-09 MED ORDER — ONDANSETRON HCL 4 MG PO TABS: 4.0000 mg | ORAL_TABLET | Freq: Three times a day (TID) | ORAL | Status: DC | PRN

## 2014-04-09 MED ORDER — DOCUSATE SODIUM 100 MG PO CAPS: 100.0000 mg | ORAL_CAPSULE | Freq: Two times a day (BID) | ORAL | Status: DC

## 2014-04-09 MED ORDER — DOCUSATE SODIUM 100 MG PO CAPS
100.0000 mg | ORAL_CAPSULE | Freq: Every day | ORAL | Status: DC
  Administered 2014-04-10: 100 mg via ORAL
  Filled 2014-04-09: qty 1

## 2014-04-09 MED ORDER — ONDANSETRON HCL 4 MG PO TABS: 4.0000 mg | ORAL_TABLET | Freq: Four times a day (QID) | ORAL | Status: DC | PRN

## 2014-04-09 MED ORDER — PNEUMOCOCCAL VAC POLYVALENT 25 MCG/0.5ML IJ INJ
0.5000 mL | INJECTION | INTRAMUSCULAR | Status: DC
  Filled 2014-04-09: qty 0.5

## 2014-04-09 MED ORDER — DEXTROSE-NACL 5-0.45 % IV SOLN: INTRAVENOUS | Status: AC

## 2014-04-09 MED ORDER — PROPOFOL INFUSION 10 MG/ML OPTIME
INTRAVENOUS | Status: DC | PRN
  Administered 2014-04-09: 50 ug/kg/min via INTRAVENOUS

## 2014-04-09 MED ORDER — FENTANYL CITRATE 0.05 MG/ML IJ SOLN
INTRAMUSCULAR | Status: DC | PRN
  Administered 2014-04-09: 25 ug via INTRAVENOUS

## 2014-04-09 MED ORDER — LACTATED RINGERS IV SOLN
INTRAVENOUS | Status: DC | PRN
  Administered 2014-04-09: 13:00:00 via INTRAVENOUS

## 2014-04-09 MED ORDER — POLYETHYLENE GLYCOL 3350 17 G PO PACK
17.0000 g | PACK | Freq: Every day | ORAL | Status: DC
  Administered 2014-04-10: 17 g via ORAL
  Filled 2014-04-09 (×2): qty 1

## 2014-04-09 MED ORDER — FENTANYL CITRATE 0.05 MG/ML IJ SOLN
25.0000 ug | INTRAMUSCULAR | Status: DC | PRN
Start: 2014-04-09 — End: 2014-04-10

## 2014-04-09 MED ORDER — PROPOFOL 10 MG/ML IV BOLUS
INTRAVENOUS | Status: AC
  Filled 2014-04-09: qty 20

## 2014-04-09 MED ORDER — ROPIVACAINE HCL 5 MG/ML IJ SOLN
INTRAMUSCULAR | Status: DC | PRN
  Administered 2014-04-09: 35 mL via EPIDURAL

## 2014-04-09 MED ORDER — CEFAZOLIN SODIUM-DEXTROSE 2-3 GM-% IV SOLR
2.0000 g | Freq: Four times a day (QID) | INTRAVENOUS | Status: AC
  Administered 2014-04-09 – 2014-04-10 (×3): 2 g via INTRAVENOUS
  Filled 2014-04-09 (×3): qty 50

## 2014-04-09 MED ORDER — CITALOPRAM HYDROBROMIDE 40 MG PO TABS
40.0000 mg | ORAL_TABLET | Freq: Every day | ORAL | Status: DC
  Administered 2014-04-09: 40 mg via ORAL
  Filled 2014-04-09 (×2): qty 1

## 2014-04-09 MED ORDER — IPRATROPIUM-ALBUTEROL 0.5-2.5 (3) MG/3ML IN SOLN: 3.0000 mL | RESPIRATORY_TRACT | Status: DC | PRN

## 2014-04-09 MED ORDER — OXYCODONE HCL 5 MG PO TABS: 10.0000 mg | ORAL_TABLET | ORAL | Status: DC | PRN

## 2014-04-09 MED ORDER — OXYCODONE HCL 5 MG PO TABS
ORAL_TABLET | ORAL | Status: AC
  Administered 2014-04-09: 10 mg via ORAL
  Filled 2014-04-09: qty 2

## 2014-04-09 MED ORDER — ETOMIDATE 2 MG/ML IV SOLN
10.0000 mg | Freq: Once | INTRAVENOUS | Status: DC
  Filled 2014-04-09: qty 10

## 2014-04-09 MED ORDER — ATORVASTATIN CALCIUM 20 MG PO TABS
20.0000 mg | ORAL_TABLET | Freq: Every day | ORAL | Status: DC
  Administered 2014-04-10: 20 mg via ORAL
  Filled 2014-04-09 (×2): qty 1

## 2014-04-09 MED ORDER — ONDANSETRON HCL 4 MG/2ML IJ SOLN: 4.0000 mg | Freq: Four times a day (QID) | INTRAMUSCULAR | Status: DC | PRN

## 2014-04-09 MED ORDER — LACTATED RINGERS IV SOLN
INTRAVENOUS | Status: DC
  Administered 2014-04-09: 12:00:00 via INTRAVENOUS
  Administered 2014-04-10: 10 mL/h via INTRAVENOUS

## 2014-04-09 MED ORDER — SENNOSIDES-DOCUSATE SODIUM 8.6-50 MG PO TABS
1.0000 | ORAL_TABLET | Freq: Every day | ORAL | Status: DC
  Administered 2014-04-09: 1 via ORAL
  Filled 2014-04-09: qty 1

## 2014-04-09 MED ORDER — MORPHINE SULFATE 2 MG/ML IJ SOLN
2.0000 mg | INTRAMUSCULAR | Status: DC | PRN
  Administered 2014-04-09 – 2014-04-10 (×4): 2 mg via INTRAVENOUS
  Filled 2014-04-09 (×4): qty 1

## 2014-04-09 MED ORDER — ETOMIDATE 2 MG/ML IV SOLN
INTRAVENOUS | Status: AC | PRN
  Administered 2014-04-09: 10 mg via INTRAVENOUS

## 2014-04-09 MED ORDER — ONDANSETRON HCL 4 MG/2ML IJ SOLN
INTRAMUSCULAR | Status: DC | PRN
  Administered 2014-04-09: 4 mg via INTRAVENOUS

## 2014-04-09 SURGICAL SUPPLY — 64 items
APL SKNCLS STERI-STRIP NONHPOA (GAUZE/BANDAGES/DRESSINGS) ×1
BANDAGE ELASTIC 3 VELCRO ST LF (GAUZE/BANDAGES/DRESSINGS) ×2 IMPLANT
BANDAGE ELASTIC 4 VELCRO ST LF (GAUZE/BANDAGES/DRESSINGS) ×2 IMPLANT
BENZOIN TINCTURE PRP APPL 2/3 (GAUZE/BANDAGES/DRESSINGS) ×2 IMPLANT
BIT DRILL 2.2 SS TIBIAL (BIT) ×2 IMPLANT
BLADE SURG ROTATE 9660 (MISCELLANEOUS) IMPLANT
BNDG CMPR 9X4 STRL LF SNTH (GAUZE/BANDAGES/DRESSINGS) ×1
BNDG COHESIVE 4X5 TAN STRL (GAUZE/BANDAGES/DRESSINGS) ×2 IMPLANT
BNDG ESMARK 4X9 LF (GAUZE/BANDAGES/DRESSINGS) ×2 IMPLANT
CORDS BIPOLAR (ELECTRODE) ×2 IMPLANT
COVER SURGICAL LIGHT HANDLE (MISCELLANEOUS) ×4 IMPLANT
CUFF TOURNIQUET SINGLE 18IN (TOURNIQUET CUFF) ×2 IMPLANT
CUFF TOURNIQUET SINGLE 24IN (TOURNIQUET CUFF) IMPLANT
DECANTER SPIKE VIAL GLASS SM (MISCELLANEOUS) IMPLANT
DRAPE IMP U-DRAPE 54X76 (DRAPES) ×1 IMPLANT
DRAPE OEC MINIVIEW 54X84 (DRAPES) ×1 IMPLANT
DRAPE U-SHAPE 47X51 STRL (DRAPES) ×2 IMPLANT
ELECT REM PT RETURN 9FT ADLT (ELECTROSURGICAL)
ELECTRODE REM PT RTRN 9FT ADLT (ELECTROSURGICAL) IMPLANT
GAUZE XEROFORM 1X8 LF (GAUZE/BANDAGES/DRESSINGS) ×1 IMPLANT
GLOVE BIO SURGEON STRL SZ7.5 (GLOVE) ×3 IMPLANT
GLOVE BIOGEL PI IND STRL 8 (GLOVE) ×1 IMPLANT
GLOVE BIOGEL PI INDICATOR 8 (GLOVE) ×2
GOWN STRL REUS W/ TWL LRG LVL3 (GOWN DISPOSABLE) ×1 IMPLANT
GOWN STRL REUS W/ TWL XL LVL3 (GOWN DISPOSABLE) IMPLANT
GOWN STRL REUS W/TWL LRG LVL3 (GOWN DISPOSABLE) ×2
GOWN STRL REUS W/TWL XL LVL3 (GOWN DISPOSABLE)
K-WIRE 1.6 (WIRE) ×4
K-WIRE FX5X1.6XNS BN SS (WIRE) ×2
KIT BASIN OR (CUSTOM PROCEDURE TRAY) ×2 IMPLANT
KIT ROOM TURNOVER OR (KITS) ×2 IMPLANT
KWIRE FX5X1.6XNS BN SS (WIRE) IMPLANT
MANIFOLD NEPTUNE II (INSTRUMENTS) ×1 IMPLANT
NDL HYPO 25GX1X1/2 BEV (NEEDLE) IMPLANT
NEEDLE HYPO 25GX1X1/2 BEV (NEEDLE) ×2 IMPLANT
NS IRRIG 1000ML POUR BTL (IV SOLUTION) ×4 IMPLANT
PACK ORTHO EXTREMITY (CUSTOM PROCEDURE TRAY) ×2 IMPLANT
PAD ARMBOARD 7.5X6 YLW CONV (MISCELLANEOUS) ×4 IMPLANT
PAD CAST 4YDX4 CTTN HI CHSV (CAST SUPPLIES) ×1 IMPLANT
PADDING CAST COTTON 4X4 STRL (CAST SUPPLIES) ×2
PEG LOCKING SMOOTH 2.2X14 (Peg) ×2 IMPLANT
PEG LOCKING SMOOTH 2.2X16 (Screw) ×1 IMPLANT
PEG LOCKING SMOOTH 2.2X18 (Peg) ×2 IMPLANT
PEG LOCKING SMOOTH 2.2X20 (Screw) ×3 IMPLANT
PLATE STANDARD DVR LEFT (Plate) ×2 IMPLANT
PLATE STD DVR LT 24X51 (Plate) IMPLANT
SCREW  LP NL 2.7X13MM (Screw) ×1 IMPLANT
SCREW 2.7X14MM (Screw) ×2 IMPLANT
SCREW BN 14X2.7XNONLOCK 3 LD (Screw) IMPLANT
SCREW LP NL 2.7X13MM (Screw) IMPLANT
SCREW NLOCK 2.7X14 (Screw) ×2 IMPLANT
SPONGE GAUZE 4X4 12PLY (GAUZE/BANDAGES/DRESSINGS) ×2 IMPLANT
SPONGE LAP 4X18 X RAY DECT (DISPOSABLE) ×1 IMPLANT
STRIP CLOSURE SKIN 1/2X4 (GAUZE/BANDAGES/DRESSINGS) ×2 IMPLANT
SUCTION FRAZIER TIP 8 FR DISP (SUCTIONS) ×1
SUCTION TUBE FRAZIER 8FR DISP (SUCTIONS) IMPLANT
SUT MNCRL AB 4-0 PS2 18 (SUTURE) ×2 IMPLANT
SYR CONTROL 10ML LL (SYRINGE) IMPLANT
TOWEL OR 17X24 6PK STRL BLUE (TOWEL DISPOSABLE) ×2 IMPLANT
TOWEL OR 17X26 10 PK STRL BLUE (TOWEL DISPOSABLE) ×2 IMPLANT
TOWEL OR NON WOVEN STRL DISP B (DISPOSABLE) ×2 IMPLANT
TUBE CONNECTING 12X1/4 (SUCTIONS) ×2 IMPLANT
WATER STERILE IRR 1000ML POUR (IV SOLUTION) ×2 IMPLANT
YANKAUER SUCT BULB TIP NO VENT (SUCTIONS) IMPLANT

## 2014-04-09 NOTE — Transfer of Care (Signed)
Immediate Anesthesia Transfer of Care Note  Patient: [name]  Procedure(s) Performed: Procedure(s): OPEN REDUCTION INTERNAL FIXATION (ORIF) WRIST FRACTURE (Left) PERCUTANEOUS PINNING EXTREMITY (Left)  Patient Location: PACU  Anesthesia Type:General and Regional  Level of Consciousness: awake, alert , oriented and sedated  Airway & Oxygen Therapy: Patient Spontanous Breathing and Patient connected to nasal cannula oxygen  Post-op Assessment: Report given to PACU RN, Post -op Vital signs reviewed and stable and Patient moving all extremities  Post vital signs: Reviewed and stable  Complications: No apparent anesthesia complications

## 2014-04-09 NOTE — Consult Note (Signed)
[facility] Initial Consult Note  [name]  KGU:[id number]  DOB: 14-Dec-1940  DOA: 04/08/2014 DOS: the patient was seen and examined on 04/09/2014   Referring physician: Dr. Percell Miller PCP: [name], MD   Reason for consult: Medical management  HPI: [name] is a 73 y.o. female with Past medical history of  COPD, anxiety disorder, hypothyroidism, adenocarcinoma of the lung, aortic aneurysm, carotid aneurysm, recent vertebral compression fracture. Patient presented with complaints of a mechanical fall. She was walking and when she was climbing down the curb she lost her balance and fell down. She had injury to her wrist. She denies any injury to her head or neck. She denies any new focal neurological deficit. She denies any chest pain dizziness lightheadedness shortness of breath cough nausea vomiting diarrhea or burning urination. She denies any recent change in her medication.  Review of Systems: as mentioned in the history of present illness.  A Comprehensive review of the other systems is negative.  Past Medical History  Diagnosis Date  . COPD (chronic obstructive pulmonary disease)   . Anxiety disorder   . Hypothyroidism   . PUD (peptic ulcer disease)     82yrs  . Dysphagia   . IBS (irritable bowel syndrome)   . Diverticulosis   . Trigeminal neuralgia   . Diastolic dysfunction   . Allergic rhinitis   . 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 Hiatal hernia   . Aortic aneurysm   . Complication of anesthesia 2011    bloodpressure dropped during colonoscopy, not problems since  . Shortness of breath     with exertion  . Aneurysm of carotid artery     corrected by surgery 08/21/13  . Compression fracture   . On home O2 12/15/2013    chronic hypoxia  . Depression    Past Surgical History  Procedure Laterality Date  . 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 Esophagogastroduodenoscopy  11/13/2009     w/dilation to 75mm, gastric ulceration (H.Pylori) s/p treatment  . Esophagogastroduodenoscopy  11/21/2009    distal esophageal web, gastritis  . Lung cancer surgery  12/2010    Left VATS, minithoracotomy, LLL superior segmentectomy  . Vocal cord surgery  02/06/2011    laryngoscopy with bilateral vocal cord Radiesse injection for vocal cord paralysis  . Abdominal hysterectomy    . Cholecystectomy    . Tubal ligation    . Cataract extraction    . Fatty tumor removal from lt groin    . Cataract extraction w/phaco  09/02/2012    Procedure: CATARACT EXTRACTION PHACO AND INTRAOCULAR LENS PLACEMENT (IOC);  Surgeon: Elta Guadeloupe T. Gershon Crane, MD;  Location: [facility];  Service: Ophthalmology;  Laterality: Left;  CDE:10.35  . Endarterectomy Right 08/21/2013    Procedure: RIGHT CAROTID ANEURYSM RESECTION;  Surgeon: Rosetta Posner, MD;  Location: [facility] OR;  Service: Vascular;  Laterality: Right;  . Fracture surgery Left     hand and left ring finger   Social History:  reports that she quit smoking about 3 years ago. Her smoking use included Cigarettes. She has a 10 pack-year smoking history. She has never used smokeless tobacco. She reports that she drinks alcohol. She reports that she does not use illicit drugs. Patient is coming from home  Allergies  Allergen Reactions  . Ciprofloxacin Hcl Hives  . Iohexol      Desc: Per [facility], pt is allergic to IV  contrast, no type of reaction was available. Pt cannot remember but first reacted around 15 yrs ago from an IVP. Notes were date from 2009 at urology center., Onset Date: [id number]   . Prozac [Fluoxetine Hcl] Rash  . Sulfonamide Derivatives Hives and Rash    Family History  Problem Relation Age of Onset  . Liver cancer Sister   . Cancer Sister   . Pulmonary embolism Mother   . Heart attack Father   . Hypertension Father   . Cancer Brother   . Cancer Daughter     Prior to Admission medications   Medication Sig Start Date End Date Taking? Authorizing  Provider  alendronate (FOSAMAX) 70 MG tablet Take 1 tablet (70 mg total) by mouth once a week. Take with a full glass of water on an empty stomach. Take on sundays 12/02/13  Yes [name], MD  atorvastatin (LIPITOR) 20 MG tablet Take 1 tablet (20 mg total) by mouth daily. 03/18/14  Yes [name], MD  calcium citrate-vitamin D (CITRACAL+D) 315-200 MG-UNIT per tablet Take 1 tablet by mouth daily.    Yes Historical Provider, MD  Cholecalciferol (VITAMIN D-3) 5000 UNITS TABS Take 5,000 Units by mouth daily.    Yes Historical Provider, MD  citalopram (CELEXA) 40 MG tablet Take 1 tablet (40 mg total) by mouth at bedtime. 12/02/13  Yes [name], MD  cyclobenzaprine (FLEXERIL) 10 MG tablet Take 1 tablet (10 mg total) by mouth every 8 (eight) hours as needed for muscle spasms. 12/28/13  Yes [name], MD  docusate sodium (COLACE) 100 MG capsule Take 100 mg by mouth daily.   Yes Historical Provider, MD  fish oil-omega-3 fatty acids 1000 MG capsule Take 1 g by mouth daily.    Yes Historical Provider, MD  ibuprofen (ADVIL,MOTRIN) 200 MG tablet Take 400 mg by mouth every 6 (six) hours as needed (for pain).   Yes Historical Provider, MD  levothyroxine (SYNTHROID, LEVOTHROID) 25 MCG tablet Take 1 tablet (25 mcg total) by mouth daily before breakfast. 12/02/13  Yes [name], MD  Multiple Vitamin (MULTIVITAMIN WITH MINERALS) TABS Take 1 tablet by mouth daily.   Yes Historical Provider, MD  polyethylene glycol (MIRALAX / GLYCOLAX) packet Take 17 g by mouth daily.   Yes Historical Provider, MD  rOPINIRole (REQUIP) 0.5 MG tablet Take 1 tablet (0.5 mg total) by mouth 3 (three) times daily. 03/18/14  Yes [name], MD  senna-docusate (SENOKOT-S) 8.6-50 MG per tablet Take 1 tablet by mouth at bedtime.    Yes Historical Provider, MD    Physical Exam: Filed Vitals:   04/09/14 0430 04/09/14 0445 04/09/14 0500 04/09/14 0530  BP: 119/71  112/66 129/77  Pulse: 82  82 89  Temp:    98.5  F (36.9 C)  TempSrc:    Oral  Resp:    20  SpO2: 93% 95%  94%    General: Alert, Awake and Oriented to Time, Place and Person. Appear in mild distress Eyes: PERRL ENT: Oral Mucosa clearmoist. Neck: No JVD,   Cardiovascular: S1 and S2 Present, no Murmur, Peripheral Pulses Present Respiratory: Bilateral Air entry equal and Decreased, Clear to Auscultation,  No Crackles, no wheezes Abdomen: Bowel Sound Present, Soft and Non tender  Skin: No Rash Extremities: No Pedal edema, no calf tenderness Left wrist wrapped Neurologic: Grossly Unremarkable.  Labs:  Basic Metabolic Panel:  Recent Labs Lab 04/09/14 0445  NA 136*  K 4.4  CL 98  CO2  24  GLUCOSE 107*  BUN 12  CREATININE 0.46*  CALCIUM 9.8   Liver Function Tests: No results found for this basename: AST, ALT, ALKPHOS, BILITOT, PROT, ALBUMIN,  in the last 168 hours No results found for this basename: LIPASE, AMYLASE,  in the last 168 hours No results found for this basename: AMMONIA,  in the last 168 hours CBC:  Recent Labs Lab 04/09/14 0445  WBC 10.0  HGB 12.5  HCT 37.6  MCV 86.6  PLT 315   Cardiac Enzymes: No results found for this basename: CKTOTAL, CKMB, CKMBINDEX, TROPONINI,  in the last 168 hours  BNP (last 3 results) No results found for this basename: PROBNP,  in the last 8760 hours CBG: No results found for this basename: GLUCAP,  in the last 168 hours  Radiological Exams: Dg Wrist Complete Left  04/09/2014   CLINICAL DATA:  post reduction  EXAM: LEFT WRIST - COMPLETE 3+ VIEW  COMPARISON:  Prior radiograph from 04/08/2014  FINDINGS: Splinting material now seen overlying the left wrist, somewhat limiting evaluation for fine osseous detail.  Comminuted fractures of the distal left radius and ulna are again seen, overall improved in alignment as compared to prior study. There remains approximately in 9 mm of radial displacement at the distal radial fracture. There is approximately 2 mm of radial displacement  at the distal ulnar fracture. Slight dorsal angulation is present. Comminuted fracture fragments again seen. Diffuse soft tissue swelling present. No new fracture.  IMPRESSION: Improved alignment about previously identified comminuted distal radial and ulnar fractures status post splinting and reduction.   Electronically Signed   By: Jeannine Boga M.D.   On: 04/09/2014 03:59   Dg Wrist Complete Left  04/08/2014   CLINICAL DATA:  Distal radius pain after fall  EXAM: LEFT WRIST - COMPLETE 3+ VIEW  COMPARISON:  08/16/2007  FINDINGS: There are acute fractures to the distal radius and ulna with displacement at the level of the metaphysis by 100%. The distal fragments are displaced posteriorly and radially. Radiocarpal alignment is maintained. Osteopenia.  IMPRESSION: Markedly displaced distal radius and ulna fractures.   Electronically Signed   By: Jorje Guild M.D.   On: 04/08/2014 22:37   Ct Head Wo Contrast  04/09/2014   CLINICAL DATA:  Fall  EXAM: CT HEAD WITHOUT CONTRAST  TECHNIQUE: Contiguous axial images were obtained from the base of the skull through the vertex without intravenous contrast.  COMPARISON:  Prior CT from 07/08/2013  FINDINGS: Diffuse prominence of the CSF containing spaces is compatible with generalized cerebral atrophy. Mild chronic microvascular ischemic changes present.  There is no acute intracranial hemorrhage or infarct. No mass lesion or midline shift. Gray-white matter differentiation is well maintained. Ventricles are normal in size without evidence of hydrocephalus. CSF containing spaces are within normal limits. No extra-axial fluid collection.  The calvarium is intact.  Orbital soft tissues are within normal limits.  The paranasal sinuses are clear.  Small left mastoid effusion noted.  Small left forehead contusion is present.  IMPRESSION: 1. No acute intracranial process. 2. Small left forehead contusion. 3. Generalized cerebral atrophy with mild chronic microvascular  ischemic disease. 4. Small left mastoid effusion.   Electronically Signed   By: Jeannine Boga M.D.   On: 04/09/2014 03:20    Assessment/Plan Active Problems:   Wrist fracture   1. Fall  Wrist fracture Patient is presenting with an open wrist fracture. At the time of my evaluation the wrist was wrapped. Orthopedic is a primary  admitting him for the patient. Further recommendation by orthopedic. CT of the head negative for any acute abnormality. Patient doesn't appear to have any focal neurological deficit. We'll continue to monitor.  2. COPD Patient appears to have borderline hypoxia. At present she does not appear in any respiratory distress. Add incentive spirometry and Combivent. And she continues to remain hypoxic she may require oxygen evaluation at the time of the discharge.  3. Hypothyroidism Continue Synthroid  4. constipation Continue bowel regimen  We will follow the patient.  Author: Berle Mull, MD [facility] Pager: [phone] 04/09/2014 6:53 AM    If 7PM-7AM, please contact night-coverage [email or web address] Password [email or web address]

## 2014-04-09 NOTE — Anesthesia Procedure Notes (Addendum)
Anesthesia Regional Block:  Supraclavicular block  Pre-Anesthetic Checklist: ,, timeout performed, Correct Patient, Correct Site, Correct Laterality, Correct Procedure, Correct Position, site marked, Risks and benefits discussed, pre-op evaluation, post-op pain management  Laterality: Left  Prep: Maximum Sterile Barrier Precautions used and chloraprep       Needles:  Injection technique: Single-shot  Needle Type: Echogenic Stimulator Needle     Needle Length: 5cm 5 cm Needle Gauge: 22 and 22 G    Additional Needles:  Procedures: ultrasound guided (picture in chart) Supraclavicular block Narrative:  Start time: 04/09/2014 12:48 PM End time: 04/09/2014 1:00 PM Injection made incrementally with aspirations every 5 mL. Anesthesiologist: Fitzgerald,MD  Additional Notes: 2% Lidocaine skin wheel. Intercostobrachial block with 8cc of 0.5% Ropivicaine plain.   Procedure Name: MAC Performed by: Scheryl Darter Pre-anesthesia Checklist: Patient identified, Emergency Drugs available, Suction available, Patient being monitored and Timeout performed Patient Re-evaluated:Patient Re-evaluated prior to inductionOxygen Delivery Method: Simple face mask Intubation Type: IV induction Placement Confirmation: positive ETCO2

## 2014-04-09 NOTE — Discharge Summary (Signed)
Physician Discharge Summary  Patient ID: [name] MRN: [id number] DOB/AGE: Mar 26, 1941 73 y.o.  Admit date: 04/08/2014 Discharge date: 04/09/2014  Admission Diagnoses:  <principal problem not specified>  Discharge Diagnoses:  Active Problems:   Wrist fracture   Past Medical History  Diagnosis Date  . COPD (chronic obstructive pulmonary disease)   . Anxiety disorder   . Hypothyroidism   . PUD (peptic ulcer disease)     6yrs  . Dysphagia   . IBS (irritable bowel syndrome)   . Diverticulosis   . Trigeminal neuralgia   . Diastolic dysfunction   . Allergic rhinitis   . 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 Hiatal hernia   . Aortic aneurysm   . Complication of anesthesia 2011    bloodpressure dropped during colonoscopy, not problems since  . Shortness of breath     with exertion  . Aneurysm of carotid artery     corrected by surgery 08/21/13  . Compression fracture   . On home O2 12/15/2013    chronic hypoxia  . Depression     Surgeries: Procedure(s): OPEN REDUCTION INTERNAL FIXATION (ORIF) WRIST FRACTURE PERCUTANEOUS PINNING EXTREMITY on 04/08/2014 - 04/09/2014   Consultants (if any): Treatment Team:  Renette Butters, MD Sharyon Medicus, MD  Discharged Condition: Improved  Hospital Course: [name] is an 73 y.o. female who was admitted 04/08/2014 with a diagnosis of <principal problem not specified> and went to the operating room on 04/08/2014 - 04/09/2014 and underwent the above named procedures.    She was given perioperative antibiotics:  Anti-infectives   None    .  She was given sequential compression devices, early ambulation, and otherwise not indicated for DVT prophylaxis.  She benefited maximally from the hospital stay and there were no complications.    Recent vital signs:  Filed Vitals:   04/09/14 1315  BP:   Pulse: 85  Temp:   Resp: 18    Recent laboratory studies:  Lab Results  Component Value Date   HGB 12.5  04/09/2014   HGB 13.4 03/15/2014   HGB 13.7 01/07/2014   Lab Results  Component Value Date   WBC 10.0 04/09/2014   PLT 315 04/09/2014   Lab Results  Component Value Date   INR 0.93 08/19/2013   Lab Results  Component Value Date   NA 136* 04/09/2014   K 4.4 04/09/2014   CL 98 04/09/2014   CO2 24 04/09/2014   BUN 12 04/09/2014   CREATININE 0.46* 04/09/2014   GLUCOSE 107* 04/09/2014    Discharge Medications:     Medication List         alendronate 70 MG tablet  Commonly known as:  FOSAMAX  Take 1 tablet (70 mg total) by mouth once a week. Take with a full glass of water on an empty stomach. Take on sundays     atorvastatin 20 MG tablet  Commonly known as:  LIPITOR  Take 1 tablet (20 mg total) by mouth daily.     calcium citrate-vitamin D 315-200 MG-UNIT per tablet  Commonly known as:  CITRACAL+D  Take 1 tablet by mouth daily.     citalopram 40 MG tablet  Commonly known as:  CELEXA  Take 1 tablet (40 mg total) by mouth at bedtime.     cyclobenzaprine 10 MG tablet  Commonly known as:  FLEXERIL  Take 1 tablet (10 mg total) by mouth every 8 (eight) hours as needed for muscle spasms.  docusate sodium 100 MG capsule  Commonly known as:  COLACE  Take 100 mg by mouth daily.     docusate sodium 100 MG capsule  Commonly known as:  COLACE  Take 1 capsule (100 mg total) by mouth 2 (two) times daily.     fish oil-omega-3 fatty acids 1000 MG capsule  Take 1 g by mouth daily.     ibuprofen 200 MG tablet  Commonly known as:  ADVIL,MOTRIN  Take 400 mg by mouth every 6 (six) hours as needed (for pain).     levothyroxine 25 MCG tablet  Commonly known as:  SYNTHROID, LEVOTHROID  Take 1 tablet (25 mcg total) by mouth daily before breakfast.     multivitamin with minerals Tabs tablet  Take 1 tablet by mouth daily.     ondansetron 4 MG tablet  Commonly known as:  ZOFRAN  Take 1 tablet (4 mg total) by mouth every 8 (eight) hours as needed for nausea or vomiting.     oxyCODONE 5 MG  immediate release tablet  Commonly known as:  Oxy IR/ROXICODONE  Take 2 tablets (10 mg total) by mouth every 4 (four) hours as needed for severe pain.     polyethylene glycol packet  Commonly known as:  MIRALAX / GLYCOLAX  Take 17 g by mouth daily.     rOPINIRole 0.5 MG tablet  Commonly known as:  REQUIP  Take 1 tablet (0.5 mg total) by mouth 3 (three) times daily.     senna-docusate 8.6-50 MG per tablet  Commonly known as:  Senokot-S  Take 1 tablet by mouth at bedtime.     Vitamin D-3 5000 UNITS Tabs  Take 5,000 Units by mouth daily.        Diagnostic Studies: Dg Wrist Complete Left  05-08-2014   CLINICAL DATA:  post reduction  EXAM: LEFT WRIST - COMPLETE 3+ VIEW  COMPARISON:  Prior radiograph from 04/08/2014  FINDINGS: Splinting material now seen overlying the left wrist, somewhat limiting evaluation for fine osseous detail.  Comminuted fractures of the distal left radius and ulna are again seen, overall improved in alignment as compared to prior study. There remains approximately in 9 mm of radial displacement at the distal radial fracture. There is approximately 2 mm of radial displacement at the distal ulnar fracture. Slight dorsal angulation is present. Comminuted fracture fragments again seen. Diffuse soft tissue swelling present. No new fracture.  IMPRESSION: Improved alignment about previously identified comminuted distal radial and ulnar fractures status post splinting and reduction.   Electronically Signed   By: Jeannine Boga M.D.   On: May 08, 2014 03:59   Dg Wrist Complete Left  04/08/2014   CLINICAL DATA:  Distal radius pain after fall  EXAM: LEFT WRIST - COMPLETE 3+ VIEW  COMPARISON:  08/16/2007  FINDINGS: There are acute fractures to the distal radius and ulna with displacement at the level of the metaphysis by 100%. The distal fragments are displaced posteriorly and radially. Radiocarpal alignment is maintained. Osteopenia.  IMPRESSION: Markedly displaced distal radius  and ulna fractures.   Electronically Signed   By: Jorje Guild M.D.   On: 04/08/2014 22:37   Ct Head Wo Contrast  May 08, 2014   CLINICAL DATA:  Fall  EXAM: CT HEAD WITHOUT CONTRAST  TECHNIQUE: Contiguous axial images were obtained from the base of the skull through the vertex without intravenous contrast.  COMPARISON:  Prior CT from 07/08/2013  FINDINGS: Diffuse prominence of the CSF containing spaces is compatible with generalized cerebral atrophy. Mild  chronic microvascular ischemic changes present.  There is no acute intracranial hemorrhage or infarct. No mass lesion or midline shift. Gray-white matter differentiation is well maintained. Ventricles are normal in size without evidence of hydrocephalus. CSF containing spaces are within normal limits. No extra-axial fluid collection.  The calvarium is intact.  Orbital soft tissues are within normal limits.  The paranasal sinuses are clear.  Small left mastoid effusion noted.  Small left forehead contusion is present.  IMPRESSION: 1. No acute intracranial process. 2. Small left forehead contusion. 3. Generalized cerebral atrophy with mild chronic microvascular ischemic disease. 4. Small left mastoid effusion.   Electronically Signed   By: Jeannine Boga M.D.   On: 04/09/2014 03:20    Disposition: 03-Skilled Nursing Facility        Follow-up Information   Follow up with Edmonia Lynch, D, MD In 2 weeks.   Specialty:  Orthopedic Surgery   Contact information:   [address] [place] [address] [phone]        Signed: Renette Butters 04/09/2014, 3:57 PM

## 2014-04-09 NOTE — Op Note (Signed)
04/08/2014 - 04/09/2014  3:51 PM  PATIENT:  [name]    PRE-OPERATIVE DIAGNOSIS:  left wrist fracture  POST-OPERATIVE DIAGNOSIS:  Same  PROCEDURE:  OPEN REDUCTION INTERNAL FIXATION (ORIF) WRIST FRACTURE, PERCUTANEOUS PINNING EXTREMITY  SURGEON:  Shereda Graw, D, MD  ASSISTANT: Joya Gaskins OPA  ANESTHESIA:   regional  PREOPERATIVE INDICATIONS:  [name] is a  73 y.o. female with a diagnosis of left wrist fracture who failed conservative measures and elected for surgical management.    The risks benefits and alternatives were discussed with the patient preoperatively including but not limited to the risks of infection, bleeding, nerve injury, cardiopulmonary complications, the need for revision surgery, among others, and the patient was willing to proceed.  OPERATIVE IMPLANTS: DVR plate  OPERATIVE FINDINGS: stable  BLOOD LOSS: min  COMPLICATIONS: none  TOURNIQUET TIME: 62min  OPERATIVE PROCEDURE:  Patient was identified in the preoperative holding area and site was marked by me She was transported to the operating theater and placed on the table in supine position taking care to pad all bony prominences. After a preincinduction time out anesthesia was induced. The left upper extremity was prepped and draped in normal sterile fashion and a pre-incision timeout was performed. She received ancef for preoperative antibiotics.   I made a 6 cm incision centered over her FCR tendon dissected down carefully to the level of the extensor tendon sheath and incise this longitudinally and retracted the FCR radially and incised the dorsal aspect of the sheath. I bluntly dissected the FPL muscle belly away from the brachioradialis and then sharply incised the quadratus tendon from the distal radius and from the wrist capsule. Elevated this off the bone the fractures medially visible. I released the brachioradialis from its insertion. I then debrided the fracture and performed a manual  reduction.  There was significant comminution of the dorsal cortex as well as 3 articular pieces. I reduced and pinned all of these into place.   I selected a standard plate and I places on the bone. In place required some moving but eventually was happy with the position of it I then placed 2 pegs and then 2 shaft screws stabilizing it there. I used the plate to assist in obtaining volar tilt. As again happy with fluoroscopic imaging and I placed the remainder of the distal pegs and shaft screws.    the ulna was unstable at this point side elected to have pain the ulnar head onto the shaft. I placed a K wire in retrograde fashion and was happy with his placement.  I thoroughly irrigated the wound and closed the pronator over top of the plate and then closed the skin in layers with absorbable stitch. Sterile dressing was applied using the PACU in stable condition.  POST OPERATIVE PLAN: Splint full-time elevate.

## 2014-04-09 NOTE — Progress Notes (Signed)
RT assessment doe at this time. Patient stated she takes MDIs PRN at home, and feels like that is all she needs here. BBS coarse, but good air movement, SAT 96% on 2 LNC. Changed to q4 PRN. Patient stated she would call as needed

## 2014-04-09 NOTE — ED Notes (Signed)
Paged Ortho for [place]

## 2014-04-09 NOTE — Discharge Instructions (Signed)
Keep your dressing clean, dry and intact.   Elevate your left arm

## 2014-04-09 NOTE — H&P (Signed)
ORTHOPAEDIC H&P  REQUESTING PHYSICIAN: Ninetta Lights, MD  Chief Complaint: left wrist injury  HPI: [name] is a 73 y.o. female who complains of  Mechanical fall yesterday evening. She had a 54m volar inside out hole. The wrist was irrigated and reduced/splinted in the ED. She had some mild tingling that has recovered  Past Medical History  Diagnosis Date  . COPD (chronic obstructive pulmonary disease)   . Anxiety disorder   . Hypothyroidism   . PUD (peptic ulcer disease)     89yr . Dysphagia   . IBS (irritable bowel syndrome)   . Diverticulosis   . Trigeminal neuralgia   . Diastolic dysfunction   . Allergic rhinitis   . 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 Hiatal hernia   . Aortic aneurysm   . Complication of anesthesia 2011    bloodpressure dropped during colonoscopy, not problems since  . Shortness of breath     with exertion  . Aneurysm of carotid artery     corrected by surgery 08/21/13  . Compression fracture   . On home O2 12/15/2013    chronic hypoxia  . Depression    Past Surgical History  Procedure Laterality Date  . 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 Esophagogastroduodenoscopy  11/13/2009    w/dilation to 1425mgastric ulceration (H.Pylori) s/p treatment  . Esophagogastroduodenoscopy  11/21/2009    distal esophageal web, gastritis  . Lung cancer surgery  12/2010    Left VATS, minithoracotomy, LLL superior segmentectomy  . Vocal cord surgery  02/06/2011    laryngoscopy with bilateral vocal cord Radiesse injection for vocal cord paralysis  . Abdominal hysterectomy    . Cholecystectomy    . Tubal ligation    . Cataract extraction    . Fatty tumor removal from lt groin    . Cataract extraction w/phaco  09/02/2012    Procedure: CATARACT EXTRACTION PHACO AND INTRAOCULAR LENS PLACEMENT (IOC);  Surgeon: MarElta Guadeloupe ShaGershon CraneD;  Location: [facility];  Service: Ophthalmology;  Laterality: Left;   CDE:10.35  . Endarterectomy Right 08/21/2013    Procedure: RIGHT CAROTID ANEURYSM RESECTION;  Surgeon: TodRosetta PosnerD;  Location: [facility];  Service: Vascular;  Laterality: Right;  . Fracture surgery Left     hand and left ring finger   History   Social History  . Marital Status: Divorced    Spouse Name: N/A    Number of Children: N/A  . Years of Education: N/A   Social History Main Topics  . Smoking status: Former Smoker -- 0.50 packs/day for 20 years    Types: Cigarettes    Quit date: 12/21/2010  . Smokeless tobacco: Never Used     Comment: smoking cessation info given and reviewed   . Alcohol Use: Yes     Comment: seldom  . Drug Use: No  . Sexual Activity: Yes    Birth Control/ Protection: Surgical   Other Topics Concern  . None   Social History Narrative  . None   Family History  Problem Relation Age of Onset  . Liver cancer Sister   . Cancer Sister   . Pulmonary embolism Mother   . Heart attack Father   . Hypertension Father   . Cancer Brother   . Cancer Daughter    Allergies  Allergen Reactions  . Ciprofloxacin Hcl Hives  . Iohexol  Desc: Per [facility], pt is allergic to IV contrast, no type of reaction was available. Pt cannot remember but first reacted around 15 yrs ago from an IVP. Notes were date from 2009 at urology center., Onset Date: [id number]   . Prozac [Fluoxetine Hcl] Rash  . Sulfonamide Derivatives Hives and Rash   Prior to Admission medications   Medication Sig Start Date End Date Taking? Authorizing Provider  alendronate (FOSAMAX) 70 MG tablet Take 1 tablet (70 mg total) by mouth once a week. Take with a full glass of water on an empty stomach. Take on sundays 12/02/13  Yes [name], MD  atorvastatin (LIPITOR) 20 MG tablet Take 1 tablet (20 mg total) by mouth daily. 03/18/14  Yes [name], MD  calcium citrate-vitamin D (CITRACAL+D) 315-200 MG-UNIT per tablet Take 1 tablet by mouth daily.    Yes Historical Provider, MD    Cholecalciferol (VITAMIN D-3) 5000 UNITS TABS Take 5,000 Units by mouth daily.    Yes Historical Provider, MD  citalopram (CELEXA) 40 MG tablet Take 1 tablet (40 mg total) by mouth at bedtime. 12/02/13  Yes [name], MD  cyclobenzaprine (FLEXERIL) 10 MG tablet Take 1 tablet (10 mg total) by mouth every 8 (eight) hours as needed for muscle spasms. 12/28/13  Yes [name], MD  docusate sodium (COLACE) 100 MG capsule Take 100 mg by mouth daily.   Yes Historical Provider, MD  fish oil-omega-3 fatty acids 1000 MG capsule Take 1 g by mouth daily.    Yes Historical Provider, MD  ibuprofen (ADVIL,MOTRIN) 200 MG tablet Take 400 mg by mouth every 6 (six) hours as needed (for pain).   Yes Historical Provider, MD  levothyroxine (SYNTHROID, LEVOTHROID) 25 MCG tablet Take 1 tablet (25 mcg total) by mouth daily before breakfast. 12/02/13  Yes [name], MD  Multiple Vitamin (MULTIVITAMIN WITH MINERALS) TABS Take 1 tablet by mouth daily.   Yes Historical Provider, MD  polyethylene glycol (MIRALAX / GLYCOLAX) packet Take 17 g by mouth daily.   Yes Historical Provider, MD  rOPINIRole (REQUIP) 0.5 MG tablet Take 1 tablet (0.5 mg total) by mouth 3 (three) times daily. 03/18/14  Yes [name], MD  senna-docusate (SENOKOT-S) 8.6-50 MG per tablet Take 1 tablet by mouth at bedtime.    Yes Historical Provider, MD   Dg Wrist Complete Left  04/09/2014   CLINICAL DATA:  post reduction  EXAM: LEFT WRIST - COMPLETE 3+ VIEW  COMPARISON:  Prior radiograph from 04/08/2014  FINDINGS: Splinting material now seen overlying the left wrist, somewhat limiting evaluation for fine osseous detail.  Comminuted fractures of the distal left radius and ulna are again seen, overall improved in alignment as compared to prior study. There remains approximately in 9 mm of radial displacement at the distal radial fracture. There is approximately 2 mm of radial displacement at the distal ulnar fracture. Slight dorsal angulation  is present. Comminuted fracture fragments again seen. Diffuse soft tissue swelling present. No new fracture.  IMPRESSION: Improved alignment about previously identified comminuted distal radial and ulnar fractures status post splinting and reduction.   Electronically Signed   By: Jeannine Boga M.D.   On: 04/09/2014 03:59   Dg Wrist Complete Left  04/08/2014   CLINICAL DATA:  Distal radius pain after fall  EXAM: LEFT WRIST - COMPLETE 3+ VIEW  COMPARISON:  08/16/2007  FINDINGS: There are acute fractures to the distal radius and ulna with displacement at the level of the  metaphysis by 100%. The distal fragments are displaced posteriorly and radially. Radiocarpal alignment is maintained. Osteopenia.  IMPRESSION: Markedly displaced distal radius and ulna fractures.   Electronically Signed   By: Jorje Guild M.D.   On: 04/08/2014 22:37   Ct Head Wo Contrast  04/09/2014   CLINICAL DATA:  Fall  EXAM: CT HEAD WITHOUT CONTRAST  TECHNIQUE: Contiguous axial images were obtained from the base of the skull through the vertex without intravenous contrast.  COMPARISON:  Prior CT from 07/08/2013  FINDINGS: Diffuse prominence of the CSF containing spaces is compatible with generalized cerebral atrophy. Mild chronic microvascular ischemic changes present.  There is no acute intracranial hemorrhage or infarct. No mass lesion or midline shift. Gray-white matter differentiation is well maintained. Ventricles are normal in size without evidence of hydrocephalus. CSF containing spaces are within normal limits. No extra-axial fluid collection.  The calvarium is intact.  Orbital soft tissues are within normal limits.  The paranasal sinuses are clear.  Small left mastoid effusion noted.  Small left forehead contusion is present.  IMPRESSION: 1. No acute intracranial process. 2. Small left forehead contusion. 3. Generalized cerebral atrophy with mild chronic microvascular ischemic disease. 4. Small left mastoid effusion.    Electronically Signed   By: Jeannine Boga M.D.   On: 04/09/2014 03:20    Positive ROS: All other systems have been reviewed and were otherwise negative with the exception of those mentioned in the HPI and as above.  Labs cbc  Recent Labs  04/09/14 0445  WBC 10.0  HGB 12.5  HCT 37.6  PLT 315    Labs inflam No results found for this basename: ESR, CRP,  in the last 72 hours  Labs coag No results found for this basename: INR, PT, PTT,  in the last 72 hours   Recent Labs  04/09/14 0445  NA 136*  K 4.4  CL 98  CO2 24  GLUCOSE 107*  BUN 12  CREATININE 0.46*  CALCIUM 9.8    Physical Exam: Filed Vitals:   04/09/14 0530  BP: 129/77  Pulse: 89  Temp: 98.5 F (36.9 C)  Resp: 20   General: Alert, no acute distress Cardiovascular: No pedal edema Respiratory: No cyanosis, no use of accessory musculature GI: No organomegaly, abdomen is soft and non-tender Neurologic: Sensation intact distally Psychiatric: Patient is competent for consent with normal mood and affect Lymphatic: No axillary or cervical lymphadenopathy  MUSCULOSKELETAL:  LUE: splint intact, Fingers are NVI, +EPL/FPL/IO Other extremities are atraumatic with painless ROM and NVI.  Assessment: Left distal radius fx and distal ulna fx  Plan: ORIF of wrist today and Irrigation of wound Weight Bearing Status: NWB PT VTE px: SCD's and ambulation   Edmonia Lynch, D, MD Cell [phone]   04/09/2014 7:20 AM

## 2014-04-09 NOTE — Anesthesia Postprocedure Evaluation (Signed)
Anesthesia Post Note  Patient: [name]  Procedure(s) Performed: Procedure(s) (LRB): OPEN REDUCTION INTERNAL FIXATION (ORIF) WRIST FRACTURE (Left) PERCUTANEOUS PINNING EXTREMITY (Left)  Anesthesia type: MAC  Patient location: PACU  Post pain: Pain level controlled and Adequate analgesia  Post assessment: Post-op Vital signs reviewed, Patient's Cardiovascular Status Stable and Respiratory Function Stable  Last Vitals:  Filed Vitals:   04/09/14 1630  BP: 104/58  Pulse: 67  Temp:   Resp: 16    Post vital signs: Reviewed and stable  Level of consciousness: awake, alert  and oriented  Complications: No apparent anesthesia complications

## 2014-04-09 NOTE — Anesthesia Preprocedure Evaluation (Addendum)
Anesthesia Evaluation  Patient identified by MRN, date of birth, ID band Patient awake    Reviewed: Allergy & Precautions, H&P , NPO status , Patient's Chart, lab work & pertinent test results  Airway Mallampati: II TM Distance: >3 FB Neck ROM: Full    Dental no notable dental hx. (+) Teeth Intact, Dental Advisory Given   Pulmonary shortness of breath, COPD oxygen dependent, former smoker,  breath sounds clear to auscultation  Pulmonary exam normal       Cardiovascular + Peripheral Vascular Disease negative cardio ROS  Rhythm:Regular Rate:Normal     Neuro/Psych Anxiety Depression negative neurological ROS  negative psych ROS   GI/Hepatic Neg liver ROS, hiatal hernia, PUD,   Endo/Other  negative endocrine ROSHypothyroidism   Renal/GU negative Renal ROS  negative genitourinary   Musculoskeletal   Abdominal   Peds  Hematology negative hematology ROS (+)   Anesthesia Other Findings   Reproductive/Obstetrics negative OB ROS                          Anesthesia Physical Anesthesia Plan  ASA: III  Anesthesia Plan: MAC and Regional   Post-op Pain Management:    Induction: Intravenous  Airway Management Planned: Simple Face Mask  Additional Equipment:   Intra-op Plan:   Post-operative Plan:   Informed Consent: I have reviewed the patients History and Physical, chart, labs and discussed the procedure including the risks, benefits and alternatives for the proposed anesthesia with the patient or authorized representative who has indicated his/her understanding and acceptance.   Dental advisory given  Plan Discussed with: CRNA and Surgeon  Anesthesia Plan Comments:        Anesthesia Quick Evaluation

## 2014-04-09 NOTE — ED Provider Notes (Signed)
CSN: [id number]     Arrival date & time 04/08/14  2114 History   First MD Initiated Contact with Patient 04/09/14 0012     Chief Complaint  Patient presents with  . Fall     (Consider location/radiation/quality/duration/timing/severity/associated sxs/prior Treatment) HPI This patient is a 73 yo woman with multiple chronic medical problems who presents after a fall. The patient was ambulating while using her umbrella as a make shift cane. She tried to step off of a curb and tumbled, landing on her left side. She struck her head but, denies LOC. She is not on antiplatelet or anticoagulation therapy.   Her greatest complaint is pain and deformity of the left wrist. She has 10/10 pain here which is aching and nonradiating but made worse with any attemps to move the left forearm or wrist or hand. Denies paresthesias and motor weakness. Denies history of similar sx.   Past Medical History  Diagnosis Date  . COPD (chronic obstructive pulmonary disease)   . Anxiety disorder   . Hypothyroidism   . PUD (peptic ulcer disease)     50yrs  . Dysphagia   . IBS (irritable bowel syndrome)   . Diverticulosis   . Trigeminal neuralgia   . Diastolic dysfunction   . Allergic rhinitis   . 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 Hiatal hernia   . Aortic aneurysm   . Complication of anesthesia 2011    bloodpressure dropped during colonoscopy, not problems since  . Shortness of breath     with exertion  . Aneurysm of carotid artery     corrected by surgery 08/21/13  . Compression fracture   . On home O2 12/15/2013    chronic hypoxia  . Depression    Past Surgical History  Procedure Laterality Date  . 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 Esophagogastroduodenoscopy  11/13/2009    w/dilation to 44mm, gastric ulceration (H.Pylori) s/p treatment  . Esophagogastroduodenoscopy  11/21/2009    distal esophageal web, gastritis  . Lung cancer surgery   12/2010    Left VATS, minithoracotomy, LLL superior segmentectomy  . Vocal cord surgery  02/06/2011    laryngoscopy with bilateral vocal cord Radiesse injection for vocal cord paralysis  . Abdominal hysterectomy    . Cholecystectomy    . Tubal ligation    . Cataract extraction    . Fatty tumor removal from lt groin    . Cataract extraction w/phaco  09/02/2012    Procedure: CATARACT EXTRACTION PHACO AND INTRAOCULAR LENS PLACEMENT (IOC);  Surgeon: Elta Guadeloupe T. Gershon Crane, MD;  Location: [facility];  Service: Ophthalmology;  Laterality: Left;  CDE:10.35  . Endarterectomy Right 08/21/2013    Procedure: RIGHT CAROTID ANEURYSM RESECTION;  Surgeon: Rosetta Posner, MD;  Location: [facility] OR;  Service: Vascular;  Laterality: Right;  . Fracture surgery Left     hand and left ring finger   Family History  Problem Relation Age of Onset  . Liver cancer Sister   . Cancer Sister   . Pulmonary embolism Mother   . Heart attack Father   . Hypertension Father   . Cancer Brother   . Cancer Daughter    History  Substance Use Topics  . Smoking status: Former Smoker -- 0.50 packs/day for 20 years    Types: Cigarettes    Quit date: 12/21/2010  . Smokeless tobacco: Never Used     Comment: smoking cessation info given and  reviewed   . Alcohol Use: Yes     Comment: seldom   OB History   Grav Para Term Preterm Abortions TAB SAB Ect Mult Living   2 2 2       2      Review of Systems  Ten point review of symptoms performed and is negative with the exception of symptoms noted above.     Allergies  Ciprofloxacin hcl; Iohexol; Prozac; and Sulfonamide derivatives  Home Medications   Prior to Admission medications   Medication Sig Start Date End Date Taking? Authorizing Provider  alendronate (FOSAMAX) 70 MG tablet Take 1 tablet (70 mg total) by mouth once a week. Take with a full glass of water on an empty stomach. Take on sundays 12/02/13  Yes [name], MD  atorvastatin (LIPITOR) 20 MG tablet Take 1 tablet (20  mg total) by mouth daily. 03/18/14  Yes [name], MD  calcium citrate-vitamin D (CITRACAL+D) 315-200 MG-UNIT per tablet Take 1 tablet by mouth daily.    Yes Historical Provider, MD  Cholecalciferol (VITAMIN D-3) 5000 UNITS TABS Take 5,000 Units by mouth daily.    Yes Historical Provider, MD  citalopram (CELEXA) 40 MG tablet Take 1 tablet (40 mg total) by mouth at bedtime. 12/02/13  Yes [name], MD  cyclobenzaprine (FLEXERIL) 10 MG tablet Take 1 tablet (10 mg total) by mouth every 8 (eight) hours as needed for muscle spasms. 12/28/13  Yes [name], MD  docusate sodium (COLACE) 100 MG capsule Take 100 mg by mouth daily.   Yes Historical Provider, MD  fish oil-omega-3 fatty acids 1000 MG capsule Take 1 g by mouth daily.    Yes Historical Provider, MD  ibuprofen (ADVIL,MOTRIN) 200 MG tablet Take 400 mg by mouth every 6 (six) hours as needed (for pain).   Yes Historical Provider, MD  levothyroxine (SYNTHROID, LEVOTHROID) 25 MCG tablet Take 1 tablet (25 mcg total) by mouth daily before breakfast. 12/02/13  Yes [name], MD  Multiple Vitamin (MULTIVITAMIN WITH MINERALS) TABS Take 1 tablet by mouth daily.   Yes Historical Provider, MD  polyethylene glycol (MIRALAX / GLYCOLAX) packet Take 17 g by mouth daily.   Yes Historical Provider, MD  rOPINIRole (REQUIP) 0.5 MG tablet Take 1 tablet (0.5 mg total) by mouth 3 (three) times daily. 03/18/14  Yes [name], MD  senna-docusate (SENOKOT-S) 8.6-50 MG per tablet Take 1 tablet by mouth at bedtime.    Yes Historical Provider, MD   BP 115/65  Pulse 82  Temp(Src) 98.3 F (36.8 C) (Oral)  Resp 16  SpO2 96% Physical Exam Gen: well developed and well nourished appearing Head: NCAT Eyes: PERL, EOMI Nose: no epistaixis or rhinorrhea Mouth/throat: mucosa is moist and pink Neck: supple, no stridor Lungs: CTA B, no wheezing, rhonchi or rales CV: RRR, no murmur, extremities appear well perfused.  Abd: soft, notender,  nondistended Back: no ttp, no cva ttp Skin: warm and dry Ext: normal to inspection, no dependent edema Neuro: CN ii-xii grossly intact, no focal deficits Psyche; normal affect,  calm and cooperative.  ED Course  Procedural sedation Date/Time: 04/09/2014 4:36 AM Performed by: Elyn Peers Authorized by: Elyn Peers Consent: Verbal consent obtained. written consent obtained. Risks and benefits: risks, benefits and alternatives were discussed Consent given by: patient Patient understanding: patient states understanding of the procedure being performed Patient consent: the patient's understanding of the procedure matches consent given Procedure consent: procedure consent matches procedure scheduled Relevant documents: relevant  documents present and verified Test results: test results available and properly labeled Site marked: the operative site was not marked Imaging studies: imaging studies available Patient identity confirmed: verbally with patient, arm band and hospital-assigned identification number Time out: Immediately prior to procedure a "time out" was called to verify the correct patient, procedure, equipment, support staff and site/side marked as required. Local anesthesia used: no Patient sedated: yes Sedatives: etomidate Vitals: Vital signs were monitored during sedation. Patient tolerance: Patient tolerated the procedure well with no immediate complications. Comments: Please see RN notes for stop and start times.   Reduction of dislocation Date/Time: 04/09/2014 4:37 AM Performed by: Elyn Peers Authorized by: Elyn Peers Consent: Verbal consent obtained. written consent obtained. Risks and benefits: risks, benefits and alternatives were discussed Consent given by: patient Patient understanding: patient states understanding of the procedure being performed Patient consent: the patient's understanding of the procedure matches consent given Procedure consent: procedure  consent matches procedure scheduled Relevant documents: relevant documents present and verified Test results: test results available and properly labeled Site marked: the operative site was not marked Imaging studies: imaging studies available Patient identity confirmed: verbally with patient, arm band and hospital-assigned identification number Time out: Immediately prior to procedure a "time out" was called to verify the correct patient, procedure, equipment, support staff and site/side marked as required. Local anesthesia used: no Patient sedated: yes Sedatives: etomidate Analgesia: fentanyl Vitals: Vital signs were monitored during sedation. Patient tolerance: Patient tolerated the procedure well with no immediate complications. Comments: Please see RN notes for start and stop times of procedure.  Post reduction film shows excellent reduction.    (including critical care time) Labs Review Labs Reviewed - No data to display  Imaging Review Dg Wrist Complete Left  04/08/2014   CLINICAL DATA:  Distal radius pain after fall  EXAM: LEFT WRIST - COMPLETE 3+ VIEW  COMPARISON:  08/16/2007  FINDINGS: There are acute fractures to the distal radius and ulna with displacement at the level of the metaphysis by 100%. The distal fragments are displaced posteriorly and radially. Radiocarpal alignment is maintained. Osteopenia.  IMPRESSION: Markedly displaced distal radius and ulna fractures.   Electronically Signed   By: Jorje Guild M.D.   On: 04/08/2014 22:37      MDM   Patient s/p mechanical fall with hematoma of the head and open left wrist fracture which has been reduced in the ED. We have treated prophylactically with Ancef. Case discussed with Dr. Percell Miller who will admit.     Elyn Peers, MD 04/09/14 [phone]

## 2014-04-10 ENCOUNTER — Observation Stay (HOSPITAL_COMMUNITY): Payer: Medicare HMO

## 2014-04-10 DIAGNOSIS — J42 Unspecified chronic bronchitis: Secondary | ICD-10-CM

## 2014-04-10 DIAGNOSIS — R0902 Hypoxemia: Secondary | ICD-10-CM

## 2014-04-10 DIAGNOSIS — I519 Heart disease, unspecified: Secondary | ICD-10-CM

## 2014-04-10 DIAGNOSIS — J9620 Acute and chronic respiratory failure, unspecified whether with hypoxia or hypercapnia: Secondary | ICD-10-CM

## 2014-04-10 DIAGNOSIS — S62109B Fracture of unspecified carpal bone, unspecified wrist, initial encounter for open fracture: Secondary | ICD-10-CM

## 2014-04-10 DIAGNOSIS — J9621 Acute and chronic respiratory failure with hypoxia: Secondary | ICD-10-CM

## 2014-04-10 LAB — BASIC METABOLIC PANEL
Anion gap: 12 (ref 5–15)
BUN: 9 mg/dL (ref 6–23)
CO2: 26 mEq/L (ref 19–32)
Calcium: 8.7 mg/dL (ref 8.4–10.5)
Chloride: 94 mEq/L — ABNORMAL LOW (ref 96–112)
Creatinine, Ser: 0.42 mg/dL — ABNORMAL LOW (ref 0.50–1.10)
GFR calc Af Amer: >90 mL/min (ref >90)
GFR calc non Af Amer: >90 mL/min (ref >90)
Glucose, Bld: 106 mg/dL — ABNORMAL HIGH (ref 70–99)
Potassium: 4.3 mEq/L (ref 3.7–5.3)
Sodium: 132 mEq/L — ABNORMAL LOW (ref 137–147)

## 2014-04-10 LAB — CBC
HCT: 35.5 % — ABNORMAL LOW (ref 36.0–46.0)
Hemoglobin: 11.6 g/dL — ABNORMAL LOW (ref 12.0–15.0)
MCH: 28.4 pg (ref 26.0–34.0)
MCHC: 32.7 g/dL (ref 30.0–36.0)
MCV: 86.8 fL (ref 78.0–100.0)
Platelets: 262 10*3/uL (ref 150–400)
RBC: 4.09 MIL/uL (ref 3.87–5.11)
RDW: 13.2 % (ref 11.5–15.5)
WBC: 8.7 10*3/uL (ref 4.0–10.5)

## 2014-04-10 LAB — PRO B NATRIURETIC PEPTIDE: Pro B Natriuretic peptide (BNP): 307.5 pg/mL — ABNORMAL HIGH (ref 0–125)

## 2014-04-10 LAB — TROPONIN I: Troponin I: <0.3 ng/mL (ref <0.30)

## 2014-04-10 NOTE — Care Management Note (Addendum)
CARE MANAGEMENT NOTE 04/10/2014  Patient:  [name], [name]   Account Number:  [id number]  Date Initiated:  04/10/2014  Documentation initiated by:  Ricki Miller  Subjective/Objective Assessment:   73 yr old female s/p left wrist ORIF     Action/Plan:   Patient will require oxygen for home. Sats 77-88% on room air.CM contacted patient's pharmacy- [facility]-[phone]. They will deliver to patient's room. Case Manager faxed order to them @ [phone]   Anticipated DC Date:  04/10/2014   Anticipated DC Plan:  [place]  CM consult      PAC Choice  DURABLE MEDICAL EQUIPMENT   Choice offered to / List presented to:     DME arranged  [facility]      DME agency  [facility]     HH arranged  NA      Status of service:  Completed, signed off Medicare Important Message given?   (If response is "NO", the following Medicare IM given date fields will be blank) Date Medicare IM given:   Medicare IM given by:   Date Additional Medicare IM given:   Additional Medicare IM given by:    Discharge Disposition:  HOME/SELF CARE

## 2014-04-10 NOTE — Consult Note (Addendum)
[facility] Medical Consultation  PIETRINA JAGODZINSKI SJG:[id number] DOB: [birth year] DOA: 04/08/2014 PCP: [name], MD   Requesting physician: Biagio Borg Date of consultation: 04/10/2014 Reason for consultation: hypoxia  Impression/Recommendations Active Problems:   Wrist fracture   Acute versus chronic hypoxic respiratory failure. Ddx includes COPD (previously on home oxygen), acute diastolic heart failure, although this is less likely  -  Chest x-ray:  Scarring at bilateral bases, otherwise no acute problems -  BMP and CBC:  With mild hyponatremia due to dehydration and mild acute blood loss anemia -  BNP only 307 -  Troponin neg -  ECG:  NSR without ischemic changes -  Walking pulse ox to check oxygen levels:  Needs 2L  -  Agree that if the above work up is unremarkable, she will likely need to be discharged with home oxygen.  Case management consulted and order for home oxygen placed. -  I will follow up these tests this afternoon.    Left wrist fractures -  Per orthopedics  -  Would minimize narcotic pain medications if possible  Depression/anxiety, hypothyroid, PUD, remain stable.    Acute blood loss anemia, repeat CBC in 1-2 weeks and if still anemic, further evaluation if necessary  Hyponatremia, mild and asymptomatic and likely due to post-operative dehydration.  Encourage PO intake  Patient very unsteady on her feet.  Will offer home health PT/OT or inpatient consults before discharge if she prefers.    Patient stable for discharge once PT/OT and oxygen arranged.  Chief Complaint:  Fall with left wrist fracture  HPI:  The patient is a 73 y.o. year-old female with history of COPD on home oxygen, lung cancer s/p resection, diastolic dysfunction, depression/ anxiety disorder, hypothyroidism, peptic ulcer disease, abdominal aortic aneurysm, who presents with left wrist injury.  She has been on home oxygen several times previously, once right after her  carotid artery surgery in the fall 2014, then again after a fall with vertebral fx in March 2015. She was on 2L O2 for a while but was able to transition to room air and has been doing well since then.  She denies recent wheezing, cough, SOB, worsened DOE, lower extremity edema, orthopnea, PND, or fevers.  She as recently transitioned from using a walker to a cane one week prior to admission.  She was using her cane to walk down some steps and had a fall on outstretched left hand which resulted in left distal radius and distal ulnar fractures.  She underwent ORIF of the left wrist in pinning on 7/3 by Doctor Edmonia Lynch. Postoperatively, she had some mild hypotension to 82/48 and her blood pressures have remained mildly low.  Although she was initially placed on nasal cannula, they attempted to transition her to room air and her oxygen saturations decreased to 83-88%. She was replaced on oxygen and internal medicine has been called for consultation.  She denies any change to the way she feels she is breathing.  She states every time she has been prescribed pain medication or had a procedure, her oxygen levels drop.  Denies CP, cough, wheeze, fever, sensation of SOB.     Review of Systems:  12-point ROS negative except for persistent left hand pain and as noted in HPI.    Past Medical History  Diagnosis Date  . COPD (chronic obstructive pulmonary disease)   . Anxiety disorder   . Hypothyroidism   . PUD (peptic ulcer disease)     65yrs  . Dysphagia   .  IBS (irritable bowel syndrome)   . Diverticulosis   . Trigeminal neuralgia   . Diastolic dysfunction   . Allergic rhinitis   . 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 Hiatal hernia   . Aortic aneurysm   . Complication of anesthesia 2011    bloodpressure dropped during colonoscopy, not problems since  . Shortness of breath     with exertion  . Aneurysm of carotid artery     corrected by surgery 08/21/13  . Compression  fracture   . On home O2 12/15/2013    chronic hypoxia  . Depression    Past Surgical History  Procedure Laterality Date  . 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 Esophagogastroduodenoscopy  11/13/2009    w/dilation to 39mm, gastric ulceration (H.Pylori) s/p treatment  . Esophagogastroduodenoscopy  11/21/2009    distal esophageal web, gastritis  . Lung cancer surgery  12/2010    Left VATS, minithoracotomy, LLL superior segmentectomy  . Vocal cord surgery  02/06/2011    laryngoscopy with bilateral vocal cord Radiesse injection for vocal cord paralysis  . Abdominal hysterectomy    . Cholecystectomy    . Tubal ligation    . Cataract extraction    . Fatty tumor removal from lt groin    . Cataract extraction w/phaco  09/02/2012    Procedure: CATARACT EXTRACTION PHACO AND INTRAOCULAR LENS PLACEMENT (IOC);  Surgeon: Elta Guadeloupe T. Gershon Crane, MD;  Location: [facility];  Service: Ophthalmology;  Laterality: Left;  CDE:10.35  . Endarterectomy Right 08/21/2013    Procedure: RIGHT CAROTID ANEURYSM RESECTION;  Surgeon: Rosetta Posner, MD;  Location: [facility];  Service: Vascular;  Laterality: Right;  . Fracture surgery Left     hand and left ring finger   Social History:  reports that she quit smoking about 3 years ago. Her smoking use included Cigarettes. She has a 10 pack-year smoking history. She has never used smokeless tobacco. She reports that she drinks alcohol. She reports that she does not use illicit drugs.  Allergies  Allergen Reactions  . Ciprofloxacin Hcl Hives  . Iohexol      Desc: Per [facility], pt is allergic to IV contrast, no type of reaction was available. Pt cannot remember but first reacted around 15 yrs ago from an IVP. Notes were date from 2009 at urology center., Onset Date: [id number]   . Prozac [Fluoxetine Hcl] Rash  . Sulfonamide Derivatives Hives and Rash   Family History  Problem Relation Age of Onset  . Liver cancer Sister   . Cancer Sister    . Pulmonary embolism Mother   . Heart attack Father   . Hypertension Father   . Cancer Brother   . Cancer Daughter     Prior to Admission medications   Medication Sig Start Date End Date Taking? Authorizing Provider  alendronate (FOSAMAX) 70 MG tablet Take 1 tablet (70 mg total) by mouth once a week. Take with a full glass of water on an empty stomach. Take on sundays 12/02/13  Yes [name], MD  atorvastatin (LIPITOR) 20 MG tablet Take 1 tablet (20 mg total) by mouth daily. 03/18/14  Yes [name], MD  calcium citrate-vitamin D (CITRACAL+D) 315-200 MG-UNIT per tablet Take 1 tablet by mouth daily.    Yes Historical Provider, MD  Cholecalciferol (VITAMIN D-3) 5000 UNITS TABS Take 5,000 Units by mouth daily.    Yes Historical Provider, MD  citalopram (  CELEXA) 40 MG tablet Take 1 tablet (40 mg total) by mouth at bedtime. 12/02/13  Yes [name], MD  cyclobenzaprine (FLEXERIL) 10 MG tablet Take 1 tablet (10 mg total) by mouth every 8 (eight) hours as needed for muscle spasms. 12/28/13  Yes [name], MD  docusate sodium (COLACE) 100 MG capsule Take 100 mg by mouth daily.   Yes Historical Provider, MD  fish oil-omega-3 fatty acids 1000 MG capsule Take 1 g by mouth daily.    Yes Historical Provider, MD  ibuprofen (ADVIL,MOTRIN) 200 MG tablet Take 400 mg by mouth every 6 (six) hours as needed (for pain).   Yes Historical Provider, MD  levothyroxine (SYNTHROID, LEVOTHROID) 25 MCG tablet Take 1 tablet (25 mcg total) by mouth daily before breakfast. 12/02/13  Yes [name], MD  Multiple Vitamin (MULTIVITAMIN WITH MINERALS) TABS Take 1 tablet by mouth daily.   Yes Historical Provider, MD  polyethylene glycol (MIRALAX / GLYCOLAX) packet Take 17 g by mouth daily.   Yes Historical Provider, MD  rOPINIRole (REQUIP) 0.5 MG tablet Take 1 tablet (0.5 mg total) by mouth 3 (three) times daily. 03/18/14  Yes [name], MD  senna-docusate (SENOKOT-S) 8.6-50 MG per tablet Take 1  tablet by mouth at bedtime.    Yes Historical Provider, MD  docusate sodium (COLACE) 100 MG capsule Take 1 capsule (100 mg total) by mouth 2 (two) times daily. 04/09/14   Renette Butters, MD  ondansetron (ZOFRAN) 4 MG tablet Take 1 tablet (4 mg total) by mouth every 8 (eight) hours as needed for nausea or vomiting. 04/09/14   Renette Butters, MD  oxyCODONE (OXY IR/ROXICODONE) 5 MG immediate release tablet Take 2 tablets (10 mg total) by mouth every 4 (four) hours as needed for severe pain. 04/09/14   Renette Butters, MD   Physical Exam: Blood pressure 93/55, pulse 75, temperature 97.9 F (36.6 C), temperature source Oral, resp. rate 18, height 5\' 5"  (1.651 m), weight 56.7 kg (125 lb), SpO2 88.00%. Filed Vitals:   04/10/14 0204 04/10/14 0603 04/10/14 0900 04/10/14 0910  BP: 97/58 93/55    Pulse: 68 75    Temp: 97.7 F (36.5 C) 97.9 F (36.6 C)    TempSrc: Oral Oral    Resp: 18 18    Height:      Weight:      SpO2: 95% 96% 83% 88%     General:  CF, NAD  Eyes: PERRL, anicteric, noninjected  ENT: NCAT, MMM  Neck: no JVP, no thyromegaly or nodules  Cardiovascular: RRR, no mrg, 2+ pulses, warm extremities  Respiratory: Diminished bilateral BS with dry rare rales at the bilateral bases, no rhonchi, wheeze, or increased WOB  Abdomen: NABS, soft, ND/NT  Skin: ecchymoses of the left digits, otherwise no rash or ulceration  Musculoskeletal: normal tone and bulk, left arm splinted  Psychiatric: A&Ox4, normal affected  Neurologic: CN II-XII grossly intact, strength 5/5 except left arm not tested, SITLT  Labs on Admission:  Basic Metabolic Panel:  Recent Labs Lab 04/09/14 0445  NA 136*  K 4.4  CL 98  CO2 24  GLUCOSE 107*  BUN 12  CREATININE 0.46*  CALCIUM 9.8   Liver Function Tests: No results found for this basename: AST, ALT, ALKPHOS, BILITOT, PROT, ALBUMIN,  in the last 168 hours No results found for this basename: LIPASE, AMYLASE,  in the last 168 hours No results  found for this basename: AMMONIA,  in the last 168  hours CBC:  Recent Labs Lab 04/09/14 0445  WBC 10.0  HGB 12.5  HCT 37.6  MCV 86.6  PLT 315   Cardiac Enzymes: No results found for this basename: CKTOTAL, CKMB, CKMBINDEX, TROPONINI,  in the last 168 hours BNP: No components found with this basename: POCBNP,  CBG: No results found for this basename: GLUCAP,  in the last 168 hours  Radiological Exams on Admission:  Wrist 2 Views Left  04/09/2014   CLINICAL DATA:  Postop radial fracture fixation  EXAM: LEFT WRIST - 2 VIEW  COMPARISON:  None.  FINDINGS: Interval ORIF of a comminuted and displaced distal radial metaphysis fracture transfixed by a malleable side plate and multiple interlocking screws. The alignment is near anatomic.  There is a K-wire transfixing a distal ulnar arm metaphysis fracture.  There is no other fracture or dislocation.  IMPRESSION: 1. Interval ORIF of a distal radial metaphysis fracture. 2. Interval K-wire placement transfixing a distal ulnar metaphysis fracture.   Electronically Signed   By: Kathreen Devoid   On: 04/09/2014 16:30   Dg Wrist Complete Left  04/09/2014   CLINICAL DATA:  post reduction  EXAM: LEFT WRIST - COMPLETE 3+ VIEW  COMPARISON:  Prior radiograph from 04/08/2014  FINDINGS: Splinting material now seen overlying the left wrist, somewhat limiting evaluation for fine osseous detail.  Comminuted fractures of the distal left radius and ulna are again seen, overall improved in alignment as compared to prior study. There remains approximately in 9 mm of radial displacement at the distal radial fracture. There is approximately 2 mm of radial displacement at the distal ulnar fracture. Slight dorsal angulation is present. Comminuted fracture fragments again seen. Diffuse soft tissue swelling present. No new fracture.  IMPRESSION: Improved alignment about previously identified comminuted distal radial and ulnar fractures status post splinting and reduction.    Electronically Signed   By: Jeannine Boga M.D.   On: 04/09/2014 03:59   Dg Wrist Complete Left  04/08/2014   CLINICAL DATA:  Distal radius pain after fall  EXAM: LEFT WRIST - COMPLETE 3+ VIEW  COMPARISON:  08/16/2007  FINDINGS: There are acute fractures to the distal radius and ulna with displacement at the level of the metaphysis by 100%. The distal fragments are displaced posteriorly and radially. Radiocarpal alignment is maintained. Osteopenia.  IMPRESSION: Markedly displaced distal radius and ulna fractures.   Electronically Signed   By: Jorje Guild M.D.   On: 04/08/2014 22:37   Ct Head Wo Contrast  04/09/2014   CLINICAL DATA:  Fall  EXAM: CT HEAD WITHOUT CONTRAST  TECHNIQUE: Contiguous axial images were obtained from the base of the skull through the vertex without intravenous contrast.  COMPARISON:  Prior CT from 07/08/2013  FINDINGS: Diffuse prominence of the CSF containing spaces is compatible with generalized cerebral atrophy. Mild chronic microvascular ischemic changes present.  There is no acute intracranial hemorrhage or infarct. No mass lesion or midline shift. Gray-white matter differentiation is well maintained. Ventricles are normal in size without evidence of hydrocephalus. CSF containing spaces are within normal limits. No extra-axial fluid collection.  The calvarium is intact.  Orbital soft tissues are within normal limits.  The paranasal sinuses are clear.  Small left mastoid effusion noted.  Small left forehead contusion is present.  IMPRESSION: 1. No acute intracranial process. 2. Small left forehead contusion. 3. Generalized cerebral atrophy with mild chronic microvascular ischemic disease. 4. Small left mastoid effusion.   Electronically Signed   By: Pincus Badder.D.  On: 04/09/2014 03:20   Dg Chest Port 1 View  04/10/2014   CLINICAL DATA:  hypoxia, SOB  EXAM: PORTABLE CHEST - 1 VIEW  COMPARISON:  Portable chest radiograph 12/17/2013.  FINDINGS: Low lung volumes.  Cardiac silhouette stable. Bibasilar linear densities left greater than right. No regions of consolidation nor focal infiltrates. No acute osseous abnormalities.  IMPRESSION: Bibasilar scarring versus atelectasis left greater than right. Otherwise no acute cardiopulmonary disease.   Electronically Signed   By: Margaree Mackintosh M.D.   On: 04/10/2014 11:04    EKG: Independently reviewed. pending  Time spent: 52  Janece Canterbury [facility] Pager [phone]  If 7PM-7AM, please contact night-coverage [email or web address] Password [email or web address] 04/10/2014, 11:15 AM

## 2014-04-10 NOTE — Progress Notes (Signed)
Subjective: 1 Day Post-Op Procedure(s) (LRB): OPEN REDUCTION INTERNAL FIXATION (ORIF) WRIST FRACTURE (Left) PERCUTANEOUS PINNING EXTREMITY (Left) Patient reports pain as moderate.    Objective: Vital signs in last 24 hours: Temp:  [97.7 F (36.5 C)-98.2 F (36.8 C)] 97.9 F (36.6 C) (07/04 0603) Pulse Rate:  [65-87] 75 (07/04 0603) Resp:  [10-24] 18 (07/04 0603) BP: (82-122)/(46-69) 93/55 mmHg (07/04 0603) SpO2:  [83 %-97 %] 88 % (07/04 0910) Weight:  [56.7 kg (125 lb)] 56.7 kg (125 lb) (07/03 2205)  Intake/Output from previous day: 07/03 0701 - 07/04 0700 In: 1300 [P.O.:600; I.V.:700] Out: 1975 [OBSJG:2836; Blood:50] Intake/Output this shift:     Recent Labs  04/09/14 0445  HGB 12.5    Recent Labs  04/09/14 0445  WBC 10.0  RBC 4.34  HCT 37.6  PLT 315    Recent Labs  04/09/14 0445  NA 136*  K 4.4  CL 98  CO2 24  BUN 12  CREATININE 0.46*  GLUCOSE 107*  CALCIUM 9.8   No results found for this basename: LABPT, INR,  in the last 72 hours  Neurovascular intact Sensation intact distally cap refill less than 2 seconds.  Moving all fingers but painful as expected.  Some swelling of finger but joint creases present.  Assessment/Plan: 1 Day Post-Op Procedure(s) (LRB): OPEN REDUCTION INTERNAL FIXATION (ORIF) WRIST FRACTURE (Left) PERCUTANEOUS PINNING EXTREMITY (Left) Advance diet Discharge home if cleared by hospitalist.  May need home O2 to be determined by consultant.  [name] 04/10/2014, 10:28 AM

## 2014-04-10 NOTE — Progress Notes (Addendum)
SATURATION QUALIFICATIONS: (This note is used to comply with regulatory documentation for home oxygen)  Patient Saturations on Room Air at Rest = 83%  Patient Saturations on Room Air while Ambulating (standing) = 88%  Patient Saturations on 2 Liters of oxygen while Ambulating (standing) = 96%  Please briefly explain why patient needs home oxygen: Patient has history of COPD and has used home oxygen in the past. She was unable to ambulate during this assessment due to shortness of breath, but was able to stand for 2-3 minutes at a time. With deep breathing sitting and standing, saturation never went above 88% on room air. Patient required 2L of oxygen to maintain a saturation above 88%. PRIDDY, [name]

## 2014-04-12 ENCOUNTER — Encounter (HOSPITAL_COMMUNITY): Payer: Self-pay | Admitting: Orthopedic Surgery

## 2014-04-30 ENCOUNTER — Encounter: Payer: Self-pay | Admitting: Family Medicine

## 2014-04-30 MED ORDER — TRIAMCINOLONE ACETONIDE 0.1 % EX CREA: 1.0000 "application " | TOPICAL_CREAM | Freq: Two times a day (BID) | CUTANEOUS | Status: DC

## 2014-04-30 NOTE — Telephone Encounter (Signed)
Med sent to phar and pt aware

## 2014-05-19 DIAGNOSIS — G959 Disease of spinal cord, unspecified: Secondary | ICD-10-CM | POA: Insufficient documentation

## 2014-05-23 ENCOUNTER — Encounter: Payer: Self-pay | Admitting: Family Medicine

## 2014-05-24 ENCOUNTER — Encounter: Payer: Self-pay | Admitting: Family Medicine

## 2014-05-27 ENCOUNTER — Encounter: Payer: Self-pay | Admitting: Family Medicine

## 2014-05-30 ENCOUNTER — Encounter: Payer: Self-pay | Admitting: Family Medicine

## 2014-05-31 MED ORDER — CYCLOBENZAPRINE HCL 10 MG PO TABS: 10.0000 mg | ORAL_TABLET | Freq: Three times a day (TID) | ORAL | Status: DC | PRN

## 2014-05-31 NOTE — Telephone Encounter (Signed)
Prescription sent to pharmacy.

## 2014-06-01 IMAGING — CT CT CHEST W/O CM
2 of 3 series · 15 of 36 positions shown, 18 images · non-contrast
Comparison: Chest CT 01/24/2012.

CLINICAL DATA: History of lung adenocarcinoma status post surgical
resection.  Follow-up study.

CT CHEST WITHOUT CONTRAST
TECHNIQUE: Multidetector CT imaging of the chest was performed
following the standard protocol without IV contrast.

[Series 2: chestroutine 5.0 b40f · axial · 0.63mm/px · z∈[-334,-68]mm · 12 of 63 slices shown, 15 images]
[im 5/63  mediastinal]
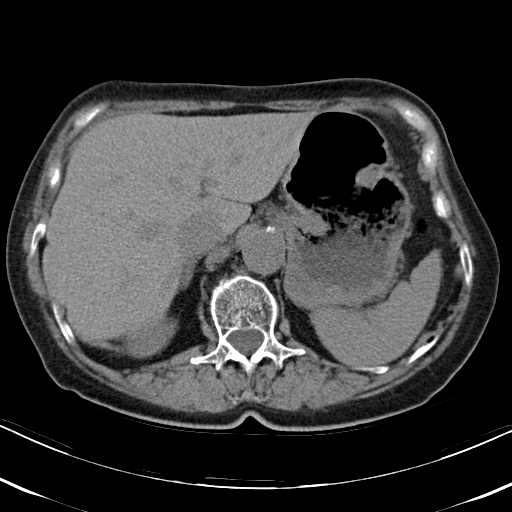
[im 5/63  lung]
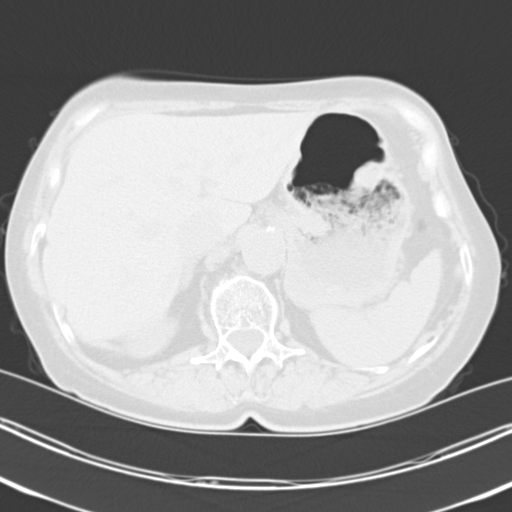
[im 10/63  lung]
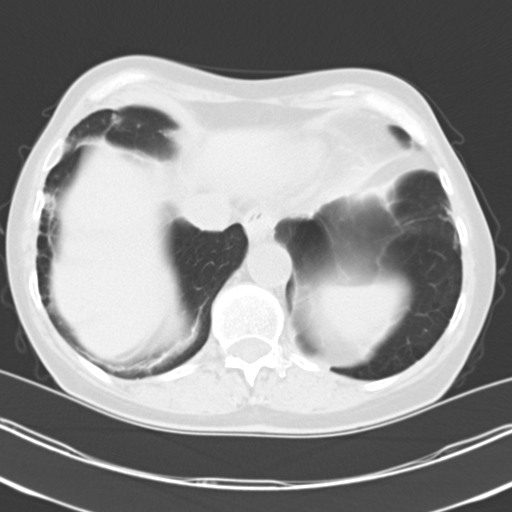
[im 14/63  lung]
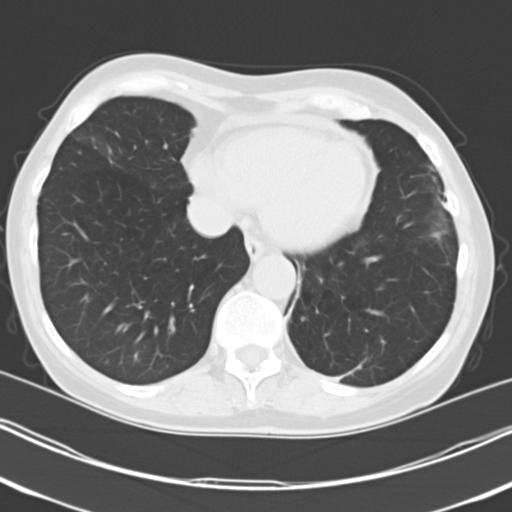
[im 19/63  lung]
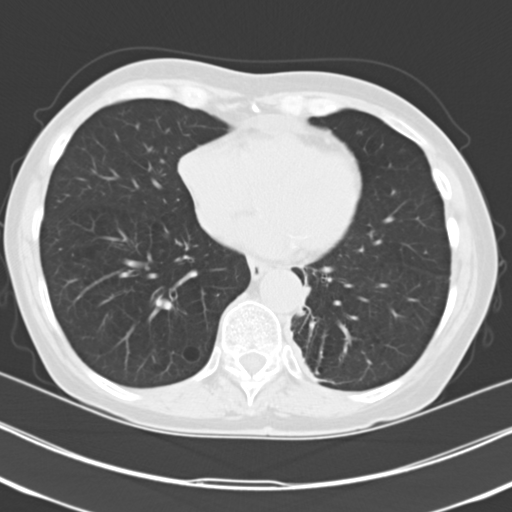
[im 23/63  mediastinal]
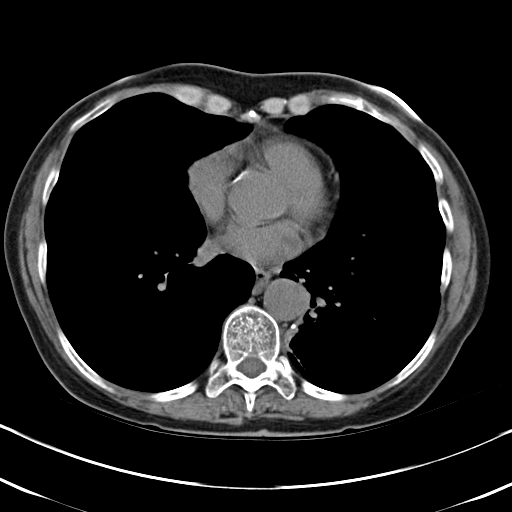
[im 23/63  lung]
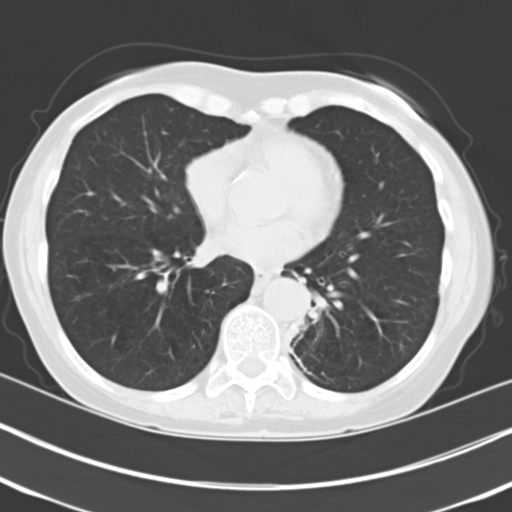
[im 28/63  lung]
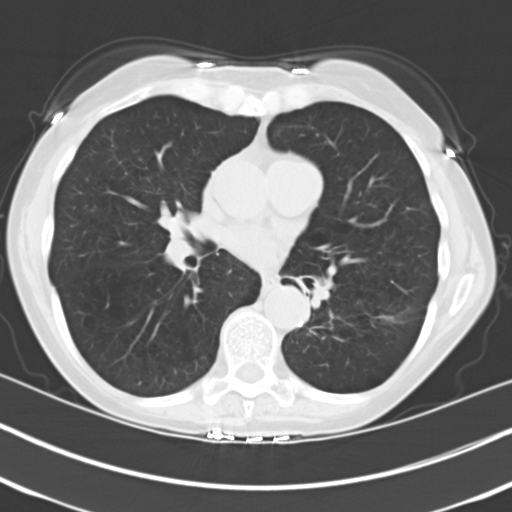
[im 35/63  lung]
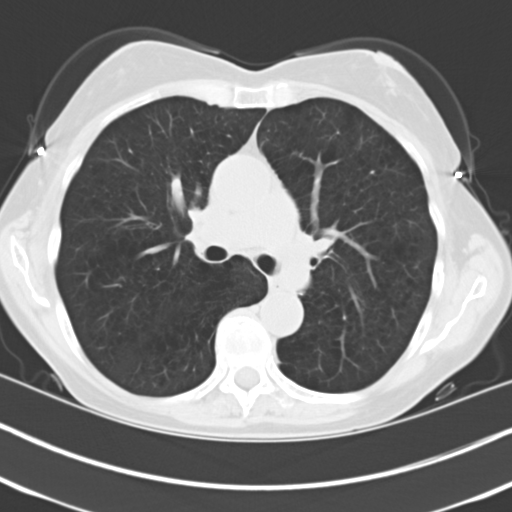
[im 40/63  lung]
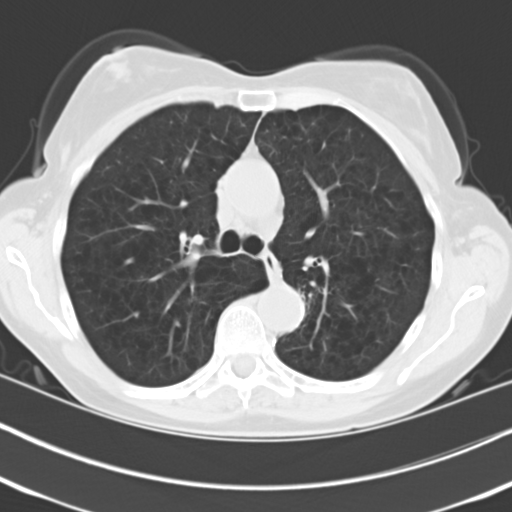
[im 44/63  mediastinal]
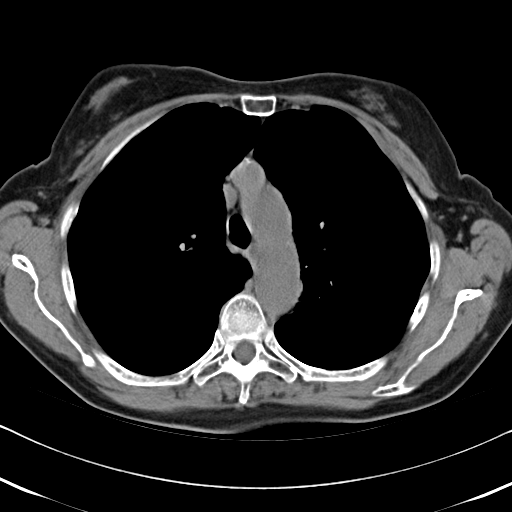
[im 44/63  lung]
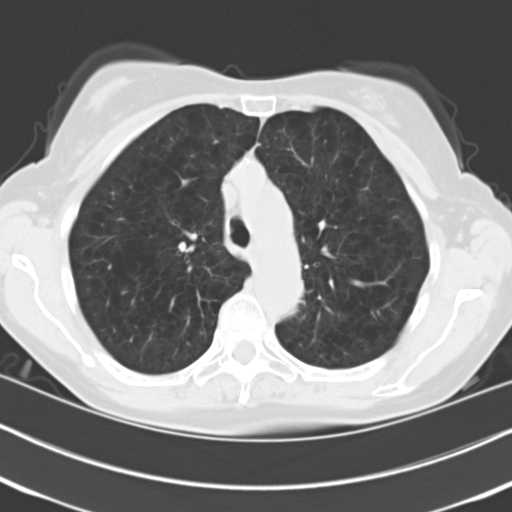
[im 49/63  lung]
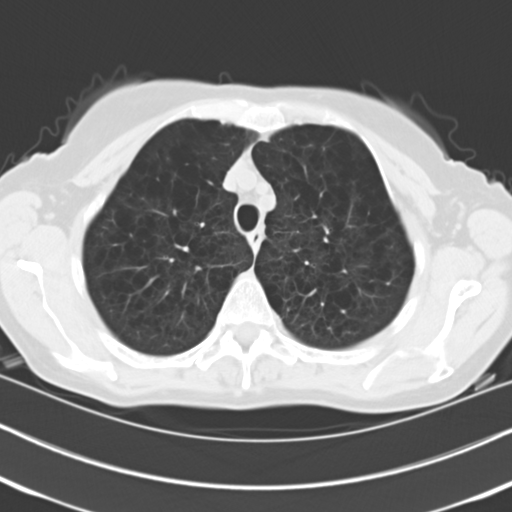
[im 53/63  lung]
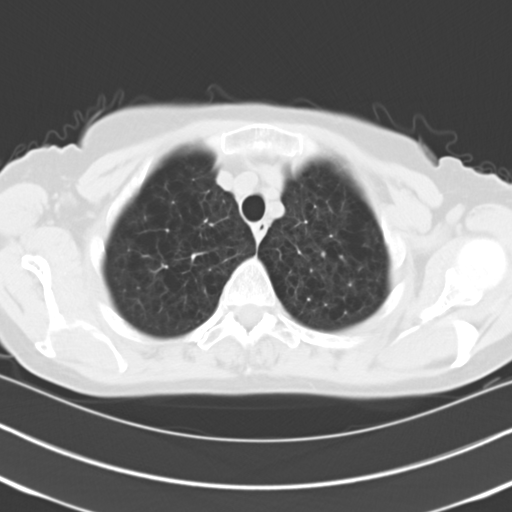
[im 58/63  lung]
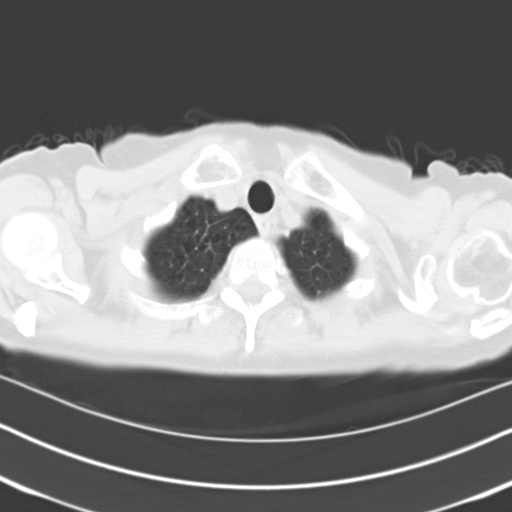

[Series 4: mpr coro 3mm · coronal · 0.62mm/px · 3 of 74 slices shown]
[im 15/74  lung]
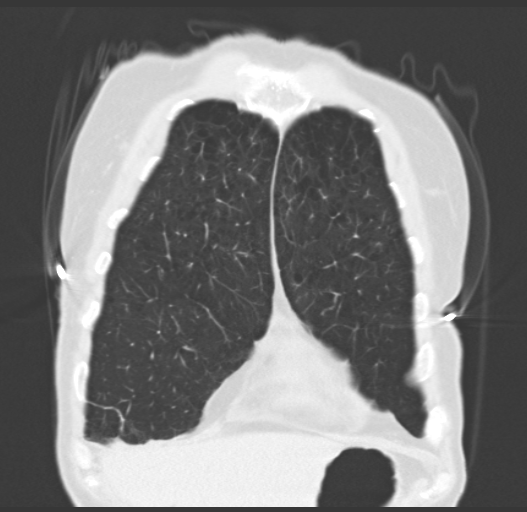
[im 30/74  lung]
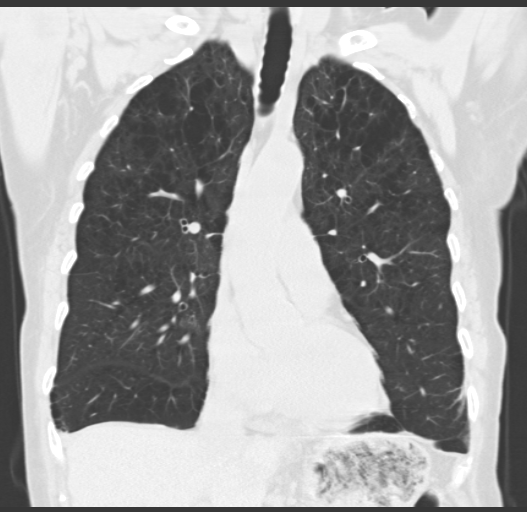
[im 44/74  lung]
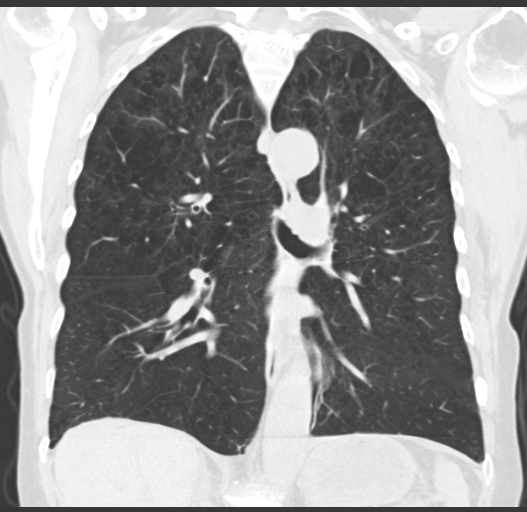

[15 of 36 positions shown; findings below may reference images not displayed]

FINDINGS: Mediastinum: Heart size is normal. A small amount of pericardial
fluid and/or thickening, similar to prior examination, unlikely to
be of hemodynamic significance at this time.  No associated
pericardial calcification. There is atherosclerosis of the thoracic
aorta, the great vessels of the mediastinum and the coronary
arteries, including calcified atherosclerotic plaque in the the
left anterior descending coronary artery. No pathologically
enlarged mediastinal or hilar lymph nodes. Please note that
accurate exclusion of hilar adenopathy is limited on noncontrast CT
scans.  Esophagus is unremarkable in appearance.

Lungs/Pleura: Postoperative changes of wedge resection are noted in
the superior segment of the left lower lobe.  There is an area of
linear scarring in the posteromedial aspect of the left lower lobe
that is similar to the prior examination.  However, there is a new
8 mm nodule along this region that is subpleural in location (image
39 of series 3).  Along the suture line itself there is no definite
focal nodularity.  3 mm nodule in the medial aspect of the left
lower lobe (image 50 of series 3) is highly nonspecific, and is
similar in retrospect compared to prior examinations.  No acute
consolidative airspace disease.  No pleural effusions.  Scarring in
the inferior segment of the lingula and lateral segment of the
right middle lobe.  A background of mild diffuse bronchial wall
thickening with moderate centrilobular and mild paraseptal
emphysema.

Upper Abdomen: Atherosclerosis.

Musculoskeletal: There are no aggressive appearing lytic or blastic
lesions noted in the visualized portions of the skeleton.  Old
compression fracture at T8 with approximately 50% loss of anterior
vertebral body height is unchanged.
IMPRESSION: 1.  Interval development of an 8 mm subpleural nodule in the
posterior aspect of the left lower lobe.  This is nonspecific,
however metastatic disease or a second primary lesion is not
excluded and close attention on follow-up studies is recommended.
At this time, a follow-up noncontrast chest CT in 3 months is
recommended.
2. Atherosclerosis, including left anterior descending coronary
artery disease.  Assessment for potential risk factor modification,
dietary therapy or pharmacologic therapy may be warranted, if
clinically indicated.
3.  Diffuse bronchial wall thickening with moderate centrilobular
and mild paraseptal emphysema; findings compatible with underlying
COPD.
4.  Additional incidental findings, as above.

to Dr. Giro, who verbally acknowledged these results.

## 2014-06-16 ENCOUNTER — Encounter: Payer: Self-pay | Admitting: Family Medicine

## 2014-07-01 ENCOUNTER — Other Ambulatory Visit: Payer: Self-pay | Admitting: Family Medicine

## 2014-07-01 DIAGNOSIS — Z139 Encounter for screening, unspecified: Secondary | ICD-10-CM

## 2014-07-19 ENCOUNTER — Ambulatory Visit (INDEPENDENT_AMBULATORY_CARE_PROVIDER_SITE_OTHER): Payer: Medicare HMO | Admitting: *Deleted

## 2014-07-19 DIAGNOSIS — Z23 Encounter for immunization: Secondary | ICD-10-CM

## 2014-07-22 ENCOUNTER — Encounter: Payer: Self-pay | Admitting: Family Medicine

## 2014-07-22 ENCOUNTER — Other Ambulatory Visit: Payer: Self-pay | Admitting: Family Medicine

## 2014-07-22 MED ORDER — CYCLOBENZAPRINE HCL 10 MG PO TABS: 10.0000 mg | ORAL_TABLET | Freq: Three times a day (TID) | ORAL | Status: DC | PRN

## 2014-07-28 ENCOUNTER — Ambulatory Visit (HOSPITAL_COMMUNITY)
Admission: RE | Admit: 2014-07-28 | Discharge: 2014-07-28 | Disposition: A | Discharge status: Home / Self Care | Payer: Medicare HMO | Source: Ambulatory Visit | Attending: Family Medicine | Admitting: Family Medicine

## 2014-07-28 ENCOUNTER — Encounter: Payer: Self-pay | Admitting: Gastroenterology

## 2014-07-28 ENCOUNTER — Ambulatory Visit (INDEPENDENT_AMBULATORY_CARE_PROVIDER_SITE_OTHER): Payer: Medicare HMO | Admitting: Gastroenterology

## 2014-07-28 ENCOUNTER — Other Ambulatory Visit: Payer: Self-pay

## 2014-07-28 VITALS — BP 117/69 | HR 81 | Temp 98.4°F | Ht 65.0 in | Wt 131.8 lb

## 2014-07-28 DIAGNOSIS — Z1231 Encounter for screening mammogram for malignant neoplasm of breast: Secondary | ICD-10-CM | POA: Diagnosis present

## 2014-07-28 DIAGNOSIS — K2970 Gastritis, unspecified, without bleeding: Principal | ICD-10-CM

## 2014-07-28 DIAGNOSIS — B9681 Helicobacter pylori [H. pylori] as the cause of diseases classified elsewhere: Secondary | ICD-10-CM

## 2014-07-28 DIAGNOSIS — K5900 Constipation, unspecified: Secondary | ICD-10-CM

## 2014-07-28 DIAGNOSIS — R1013 Epigastric pain: Secondary | ICD-10-CM

## 2014-07-28 DIAGNOSIS — Z139 Encounter for screening, unspecified: Secondary | ICD-10-CM

## 2014-07-28 MED ORDER — LUBIPROSTONE 8 MCG PO CAPS: 8.0000 ug | ORAL_CAPSULE | Freq: Two times a day (BID) | ORAL | Status: DC

## 2014-07-28 NOTE — Patient Instructions (Signed)
Please complete the breath test when you are able.   Start taking Amitiza 1 gelcap WITH FOOD twice a day. I have provided samples for you and sent a prescription.   We have scheduled you for an xray to further evaluate your esophagus and stomach.  I would like to see you back in 3 months!

## 2014-07-28 NOTE — Progress Notes (Signed)
Referring Provider: [name], MD Primary Care Physician:  [name], MD Primary GI: Dr. Oneida Alar   Chief Complaint  Patient presents with  . Dysphagia  . Constipation  . Abdominal Pain    HPI:   [name] presents today with history of dysphagia, requiring several dilations in the past. Most recently in 2013 with stricture in distal esophagus. +H.pylori at that time, treated with Pylera. Unable to have a BM without help. Miralax and stool softeners without much relief. Has a "Buddha belly". History of H.pylori gastritis, failing treatment in the past, requiring Pylera last round. Finished Pylera.  Has had 3 major surgeries this year. Feels like a lot of pressure in abdomen. Feels like she may have a large hiatal hernia. When certain foods get to stomach, it is uncomfortable. No problems with veggies, soft foods. Breads are difficult. No rectal bleeding. No PPI.   Had noticed historically a tenderness in LLQ but has improved with increased laxative use.   Past Medical History  Diagnosis Date  . COPD (chronic obstructive pulmonary disease)   . Anxiety disorder   . Hypothyroidism   . PUD (peptic ulcer disease)     86yrs  . Dysphagia   . IBS (irritable bowel syndrome)   . Diverticulosis   . Trigeminal neuralgia   . Diastolic dysfunction   . Allergic rhinitis   . 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 Hiatal hernia   . Aortic aneurysm   . Complication of anesthesia 2011    bloodpressure dropped during colonoscopy, not problems since  . Shortness of breath     with exertion  . Aneurysm of carotid artery     corrected by surgery 08/21/13  . Compression fracture   . On home O2 12/15/2013    chronic hypoxia  . Depression     Past Surgical History  Procedure Laterality Date  . 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 Esophagogastroduodenoscopy  11/13/2009    w/dilation to 75mm, gastric ulceration  (H.Pylori) s/p treatment  . Esophagogastroduodenoscopy  11/21/2009    distal esophageal web, gastritis  . Lung cancer surgery  12/2010    Left VATS, minithoracotomy, LLL superior segmentectomy  . Vocal cord surgery  02/06/2011    laryngoscopy with bilateral vocal cord Radiesse injection for vocal cord paralysis  . Abdominal hysterectomy    . Cholecystectomy    . Tubal ligation    . Cataract extraction    . Fatty tumor removal from lt groin    . Cataract extraction w/phaco  09/02/2012    Procedure: CATARACT EXTRACTION PHACO AND INTRAOCULAR LENS PLACEMENT (IOC);  Surgeon: Elta Guadeloupe T. Gershon Crane, MD;  Location: [facility];  Service: Ophthalmology;  Laterality: Left;  CDE:10.35  . Endarterectomy Right 08/21/2013    Procedure: RIGHT CAROTID ANEURYSM RESECTION;  Surgeon: Rosetta Posner, MD;  Location: [facility] OR;  Service: Vascular;  Laterality: Right;  . Fracture surgery Left     hand and left ring finger  . Orif wrist fracture Left 04/09/2014    Procedure: OPEN REDUCTION INTERNAL FIXATION (ORIF) WRIST FRACTURE;  Surgeon: Renette Butters, MD;  Location: [place];  Service: Orthopedics;  Laterality: Left;  . Percutaneous pinning Left 04/09/2014    Procedure: PERCUTANEOUS PINNING EXTREMITY;  Surgeon: Renette Butters, MD;  Location: [place];  Service: Orthopedics;  Laterality: Left;  . Esophagogastroduodenoscopy  03/14/12    OMV:[id number] in the  distal esophagus/Mild gastritis/small HH. + H.pylori, prescribed Pylera. Finished treatment.   . Back surgery  January 13, 2014    Current Outpatient Prescriptions  Medication Sig Dispense Refill  . alendronate (FOSAMAX) 70 MG tablet Take 1 tablet (70 mg total) by mouth once a week. Take with a full glass of water on an empty stomach. Take on sundays  12 tablet  4  . atorvastatin (LIPITOR) 20 MG tablet Take 1 tablet (20 mg total) by mouth daily.  90 tablet  3  . calcium citrate-vitamin D (CITRACAL+D) 315-200 MG-UNIT per tablet Take 1 tablet by mouth daily.       . Cholecalciferol  (VITAMIN D-3) 5000 UNITS TABS Take 5,000 Units by mouth daily.       . citalopram (CELEXA) 40 MG tablet Take 1 tablet (40 mg total) by mouth at bedtime.  90 tablet  4  . cyclobenzaprine (FLEXERIL) 10 MG tablet Take 1 tablet (10 mg total) by mouth every 8 (eight) hours as needed for muscle spasms.  30 tablet  1  . docusate sodium (COLACE) 100 MG capsule Take 100 mg by mouth daily.      . fish oil-omega-3 fatty acids 1000 MG capsule Take 1 g by mouth daily.       Marland Kitchen ibuprofen (ADVIL,MOTRIN) 200 MG tablet Take 400 mg by mouth every 6 (six) hours as needed (for pain).      Marland Kitchen levothyroxine (SYNTHROID, LEVOTHROID) 25 MCG tablet Take 1 tablet (25 mcg total) by mouth daily before breakfast.  90 tablet  3  . Multiple Vitamin (MULTIVITAMIN WITH MINERALS) TABS Take 1 tablet by mouth daily.      . polyethylene glycol (MIRALAX / GLYCOLAX) packet Take 17 g by mouth daily.      Marland Kitchen senna-docusate (SENOKOT-S) 8.6-50 MG per tablet Take 1 tablet by mouth at bedtime.       . docusate sodium (COLACE) 100 MG capsule Take 1 capsule (100 mg total) by mouth 2 (two) times daily.  60 capsule  0  . lubiprostone (AMITIZA) 8 MCG capsule Take 1 capsule (8 mcg total) by mouth 2 (two) times daily with a meal.  60 capsule  3   No current facility-administered medications for this visit.    Allergies as of 07/28/2014 - Review Complete 07/28/2014  Allergen Reaction Noted  . Ciprofloxacin hcl Hives 07/23/2011  . Iohexol  10/31/2010  . Prozac [fluoxetine hcl] Rash 05/11/2011  . Sulfonamide derivatives Hives and Rash     Family History  Problem Relation Age of Onset  . Liver cancer Sister   . Cancer Sister   . Pulmonary embolism Mother   . Heart attack Father   . Hypertension Father   . Cancer Brother   . Cancer Daughter   . Colon cancer Neg Hx     History   Social History  . Marital Status: Divorced    Spouse Name: N/A    Number of Children: N/A  . Years of Education: N/A   Social History Main Topics  . Smoking  status: Former Smoker -- 0.50 packs/day for 20 years    Types: Cigarettes    Quit date: 12/21/2010  . Smokeless tobacco: Never Used     Comment: smoking cessation info given and reviewed   . Alcohol Use: Yes     Comment: seldom  . Drug Use: No  . Sexual Activity: Yes    Birth Control/ Protection: Surgical   Other Topics Concern  . None   Social History  Narrative  . None    Review of Systems: As mentioned in HPI  Physical Exam: BP 117/69  Pulse 81  Temp(Src) 98.4 F (36.9 C) (Oral)  Ht 5\' 5"  (1.651 m)  Wt 131 lb 12.8 oz (59.784 kg)  BMI 21.93 kg/m2 General:   Alert and oriented. No distress noted. Pleasant and cooperative.  Head:  Normocephalic and atraumatic. Eyes:  Conjuctiva clear without scleral icterus. Mouth:  Oral mucosa pink and moist. Good dentition. No lesions. Heart:  S1, S2 present without murmurs, rubs, or gallops. Regular rate and rhythm. Abdomen:  +BS, soft, non-tender and non-distended. No rebound or guarding. No HSM or masses noted. Msk:  Symmetrical without gross deformities. Normal posture. Extremities:  Without edema. Neurologic:  Alert and  oriented x4;  grossly normal neurologically. Skin:  Intact without significant lesions or rashes. Psych:  Alert and cooperative. Normal mood and affect.

## 2014-07-30 ENCOUNTER — Encounter: Payer: Self-pay | Admitting: Gastroenterology

## 2014-07-30 DIAGNOSIS — K2970 Gastritis, unspecified, without bleeding: Principal | ICD-10-CM

## 2014-07-30 DIAGNOSIS — B9681 Helicobacter pylori [H. pylori] as the cause of diseases classified elsewhere: Secondary | ICD-10-CM | POA: Insufficient documentation

## 2014-07-30 NOTE — Assessment & Plan Note (Signed)
With known history of gastritis. Currently not on a PPI. Likely add PPI after urea breath test. Notes "pressure" and sensation of food "sticking" in epigastric region. NO true esophageal dysphagia. Proceed with BPE to further assess esophagus, stomach, ? Hiatal hernia.

## 2014-07-30 NOTE — Assessment & Plan Note (Signed)
Start Amitiza 8 mcg po BID. Samples and prescription provided. 3 month follow-up.

## 2014-07-30 NOTE — Assessment & Plan Note (Signed)
Treatment failure in remote past, +H.pylori in 2013 requiring Pylera. No PPI currently, so we will proceed with urea breath test to document eradication.

## 2014-08-02 ENCOUNTER — Ambulatory Visit (HOSPITAL_COMMUNITY)
Admission: RE | Admit: 2014-08-02 | Discharge: 2014-08-02 | Disposition: A | Discharge status: Home / Self Care | Payer: Medicare HMO | Source: Ambulatory Visit | Attending: Gastroenterology | Admitting: Gastroenterology

## 2014-08-02 DIAGNOSIS — R1310 Dysphagia, unspecified: Secondary | ICD-10-CM | POA: Diagnosis not present

## 2014-08-02 DIAGNOSIS — K449 Diaphragmatic hernia without obstruction or gangrene: Secondary | ICD-10-CM | POA: Diagnosis not present

## 2014-08-02 DIAGNOSIS — B9681 Helicobacter pylori [H. pylori] as the cause of diseases classified elsewhere: Secondary | ICD-10-CM

## 2014-08-02 DIAGNOSIS — K2970 Gastritis, unspecified, without bleeding: Secondary | ICD-10-CM

## 2014-08-03 NOTE — Progress Notes (Signed)
cc'ed to pcp °

## 2014-08-04 ENCOUNTER — Encounter: Payer: Self-pay | Admitting: Family Medicine

## 2014-08-04 ENCOUNTER — Other Ambulatory Visit: Payer: Self-pay | Admitting: Family Medicine

## 2014-08-04 LAB — H. PYLORI BREATH TEST: H. pylori Breath Test: DETECTED — AB

## 2014-08-05 ENCOUNTER — Encounter: Payer: Self-pay | Admitting: Family Medicine

## 2014-08-05 MED ORDER — CYCLOBENZAPRINE HCL 10 MG PO TABS: 10.0000 mg | ORAL_TABLET | Freq: Three times a day (TID) | ORAL | Status: DC | PRN

## 2014-08-05 NOTE — Telephone Encounter (Signed)
Med sent to pharm and pt aware via mychart

## 2014-08-09 ENCOUNTER — Encounter: Payer: Self-pay | Admitting: Gastroenterology

## 2014-08-09 ENCOUNTER — Telehealth: Payer: Self-pay | Admitting: Gastroenterology

## 2014-08-09 NOTE — Telephone Encounter (Signed)
PATIENT CALLED INQUIRING RESULTS FROM PROCEDURE LAST WEEK.

## 2014-08-10 NOTE — Telephone Encounter (Signed)
Pt returned call and is aware.

## 2014-08-10 NOTE — Telephone Encounter (Signed)
LMOM to call. Samples of the Amitiza 24 mcg at front to take bid with food.

## 2014-08-10 NOTE — Progress Notes (Signed)
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 NO mass or obstruction. Needs to be on a PPI. However, I need to discuss with Infectious Disease a good regimen for treatment of H.pylori, as she has failed the second round of treatment. ______

## 2014-08-10 NOTE — Telephone Encounter (Signed)
Routing to Laban Emperor, NP for results.

## 2014-08-10 NOTE — Telephone Encounter (Signed)
Please see result notes for further details. However, she does need to increase Amitiza to 24 mcg po BID. Please provide samples and let her know when they are available to be picked up. I sent an email to her regarding this.

## 2014-08-10 NOTE — Telephone Encounter (Signed)
Routing to Doris 

## 2014-08-10 NOTE — Progress Notes (Signed)
Quick Note:  H.pylori breath test positive. Failed treatment X 2 (last with Pylera). Will reach out to ID. ______

## 2014-08-10 NOTE — Progress Notes (Signed)
Quick Note:  LMOM to call. ______ 

## 2014-08-10 NOTE — Progress Notes (Signed)
Quick Note:  PT is aware and that we will call her when Vicente Males discusses with Infectious Disease. ______

## 2014-08-16 ENCOUNTER — Encounter: Payer: Self-pay | Admitting: Gastroenterology

## 2014-08-17 ENCOUNTER — Encounter: Payer: Self-pay | Admitting: Gastroenterology

## 2014-08-18 ENCOUNTER — Telehealth: Payer: Self-pay

## 2014-08-18 MED ORDER — PANTOPRAZOLE SODIUM 40 MG PO TBEC: 40.0000 mg | DELAYED_RELEASE_TABLET | Freq: Two times a day (BID) | ORAL | Status: DC

## 2014-08-18 MED ORDER — RIFABUTIN 150 MG PO CAPS: 300.0000 mg | ORAL_CAPSULE | Freq: Two times a day (BID) | ORAL | Status: DC

## 2014-08-18 MED ORDER — AMOXICILLIN 500 MG PO TABS: 1000.0000 mg | ORAL_TABLET | Freq: Two times a day (BID) | ORAL | Status: DC

## 2014-08-18 NOTE — Telephone Encounter (Signed)
Pt called and asked if Laban Emperor, NP has had a chance to discuss the H. Pylori with Infectious Disease. She is waiting on an answer.  She also said that sometimes she gets shortness of breath and her O2 is normal and she is wondering if the hiatal hernia could be contributing to that.   She said OK to call her back or send her a message in [place].

## 2014-08-18 NOTE — Telephone Encounter (Signed)
Spoke with ID. Options somewhat limited due to fluoroquinolone allergy. Reviewed up-to-date. Due to 2 prior treatment failures, we will go with Rifabutin 300 mg po BID, Amoxicillin 1 g BID, and Protonix 40 mg po BID for 10 days total. I sent this to her pharmacy.

## 2014-08-18 NOTE — Telephone Encounter (Signed)
Yes. Please let her know that I spoke with ID, and I am trying to figure out the best regimen due to her drug allergies. I will be addressing it today. Thanks!  Also, as for the SOB, I would follow-up with PCP.

## 2014-08-18 NOTE — Telephone Encounter (Signed)
I called and informed pt.

## 2014-08-18 NOTE — Addendum Note (Signed)
Addended by: Orvil Feil on: 08/18/2014 03:14 PM   Modules accepted: Orders

## 2014-08-18 NOTE — Telephone Encounter (Signed)
LMOM to call.

## 2014-08-18 NOTE — Telephone Encounter (Signed)
Pt is aware.  

## 2014-08-23 NOTE — Progress Notes (Signed)
Addressed with ID. See separate documentation.

## 2014-08-24 ENCOUNTER — Telehealth: Payer: Self-pay | Admitting: Gastroenterology

## 2014-08-24 MED ORDER — ONDANSETRON HCL 4 MG PO TABS: 4.0000 mg | ORAL_TABLET | Freq: Three times a day (TID) | ORAL | Status: DC | PRN

## 2014-08-24 NOTE — Telephone Encounter (Signed)
I sent in Zofran. It is very important she complete the entire course of abx. Hopefully, the Zofran will help.

## 2014-08-24 NOTE — Addendum Note (Signed)
Addended by: Orvil Feil on: 08/24/2014 01:21 PM   Modules accepted: Orders

## 2014-08-24 NOTE — Telephone Encounter (Signed)
Pt is aware.  

## 2014-08-24 NOTE — Telephone Encounter (Signed)
Pt has been taking antibiotics and is starting to feel queasy and wondered if the antibiotics could be causing that. Please call patient at [phone]

## 2014-08-24 NOTE — Telephone Encounter (Signed)
Pt is taking the antibiotics with food and still feeling sick to the stomach. Please advise!

## 2014-08-25 ENCOUNTER — Telehealth: Payer: Self-pay

## 2014-08-25 MED ORDER — PROMETHAZINE HCL 12.5 MG PO TABS: 12.5000 mg | ORAL_TABLET | Freq: Four times a day (QID) | ORAL | Status: DC | PRN

## 2014-08-25 NOTE — Telephone Encounter (Signed)
Pt left a Vm and said the Zofran would cost her $17.00 but the insurance will cover phenergan and that is what she was given in the hospital. Can we went  Prescription for phenergan?

## 2014-08-25 NOTE — Telephone Encounter (Signed)
Pt is aware.  

## 2014-08-25 NOTE — Telephone Encounter (Signed)
I sent in Phenergan 12.5 mg every 6-8 hours prn nausea.

## 2014-08-25 NOTE — Telephone Encounter (Signed)
Pt is calling to see if we can call in Phenergan in place of the Zofran. Please advise

## 2014-09-03 IMAGING — CT CT CHEST W/O CM
2 of 3 series · 15 of 36 positions shown, 18 images · non-contrast
Comparison: 07/29/2012

CLINICAL DATA: Follow-up left pulmonary nodule.  Prior history of
lung adenocarcinoma.

CT CHEST WITHOUT CONTRAST
TECHNIQUE: Multidetector CT imaging of the chest was performed
following the standard protocol without IV contrast.

[Series 3: chestroutine 5.0 b40f · axial · 0.61mm/px · z∈[-374,-109]mm · 12 of 63 slices shown, 15 images]
[im 5/63  mediastinal]
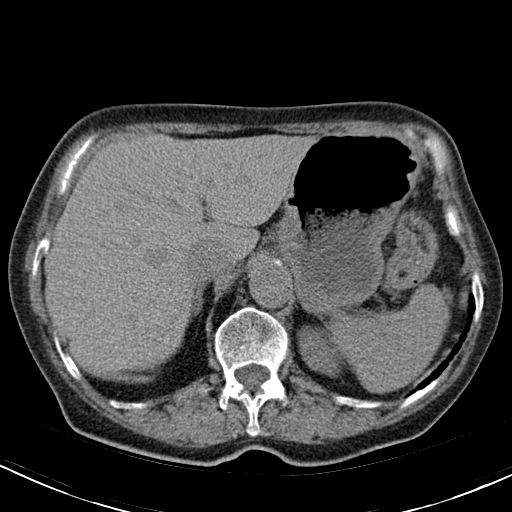
[im 5/63  lung]
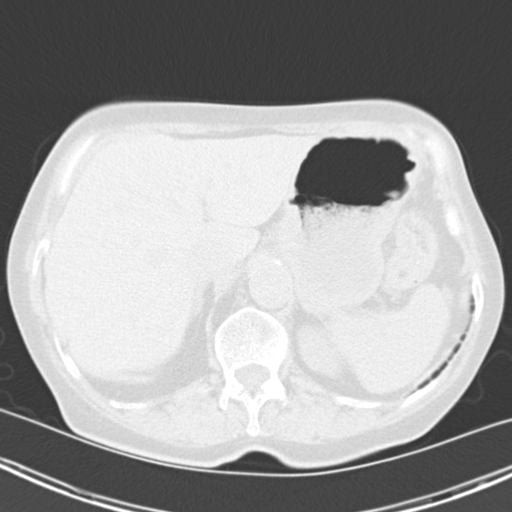
[im 10/63  lung]
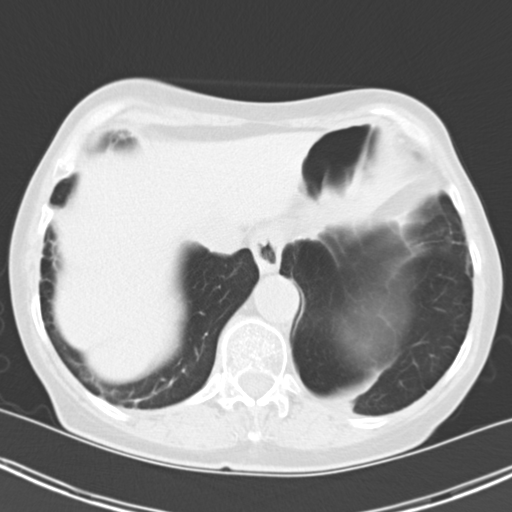
[im 14/63  lung]
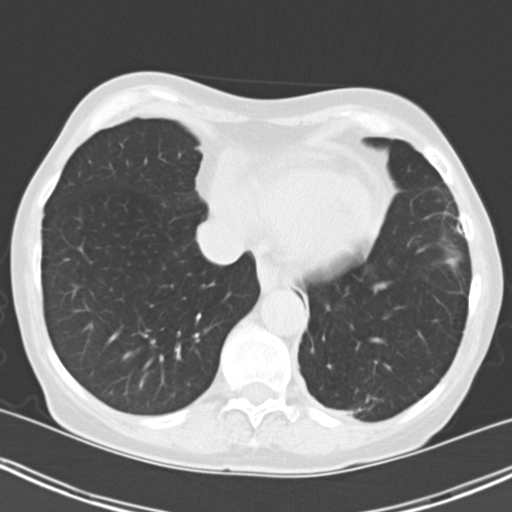
[im 19/63  lung]
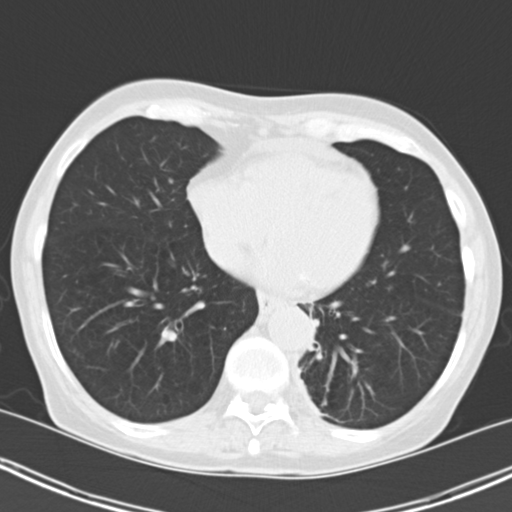
[im 23/63  mediastinal]
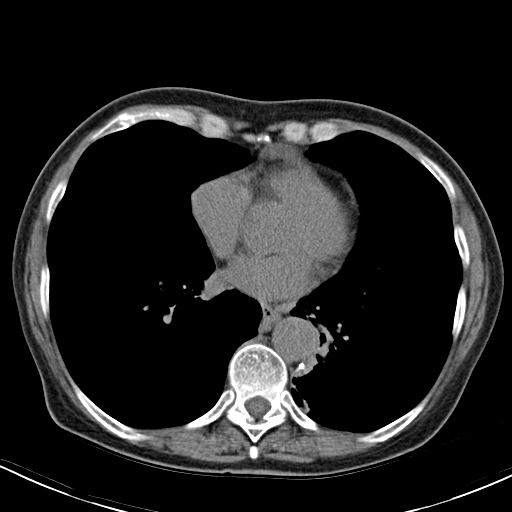
[im 23/63  lung]
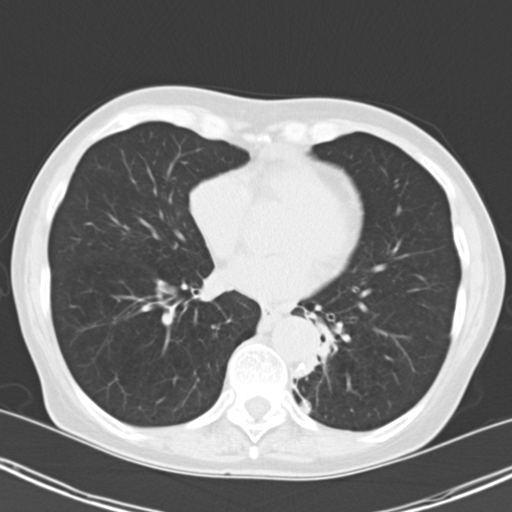
[im 28/63  lung]
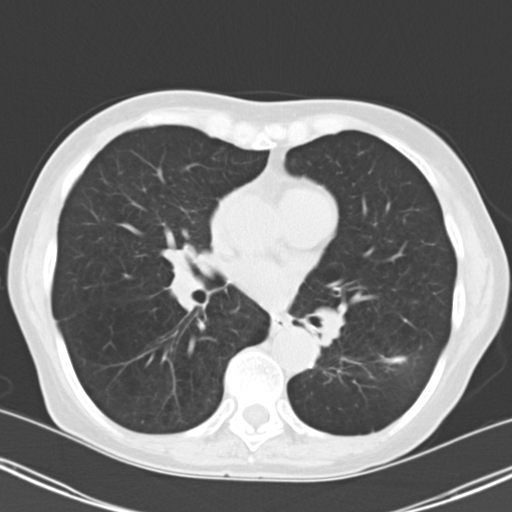
[im 35/63  lung]
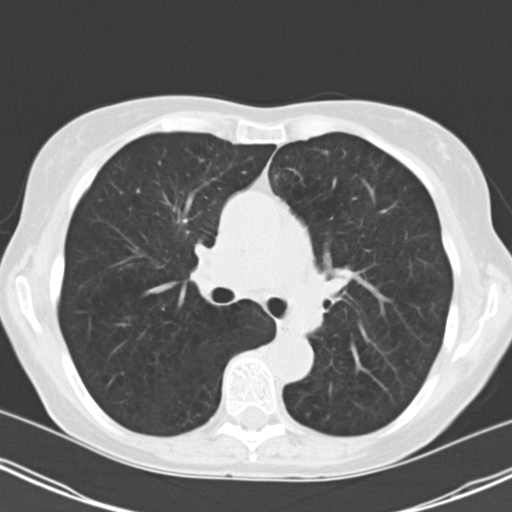
[im 40/63  lung]
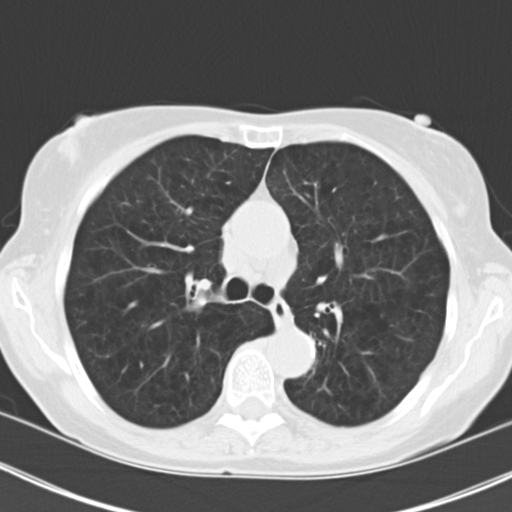
[im 44/63  mediastinal]
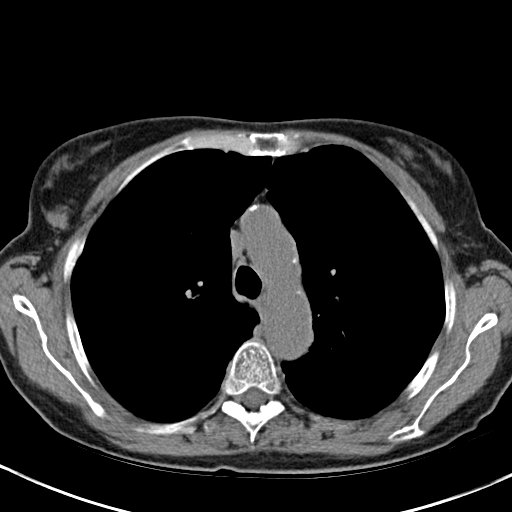
[im 44/63  lung]
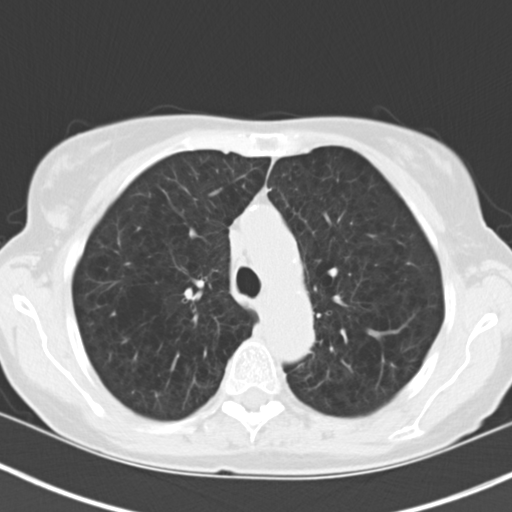
[im 49/63  lung]
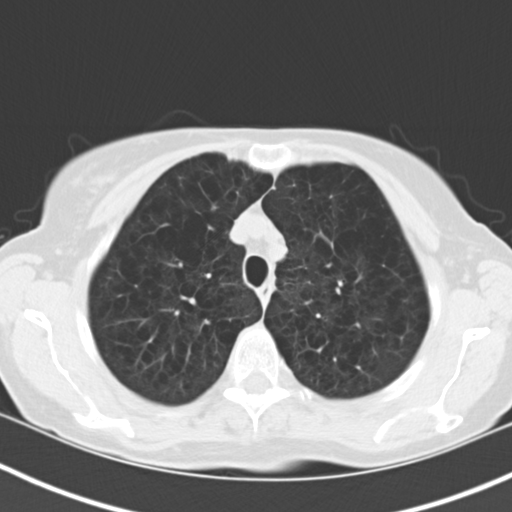
[im 53/63  lung]
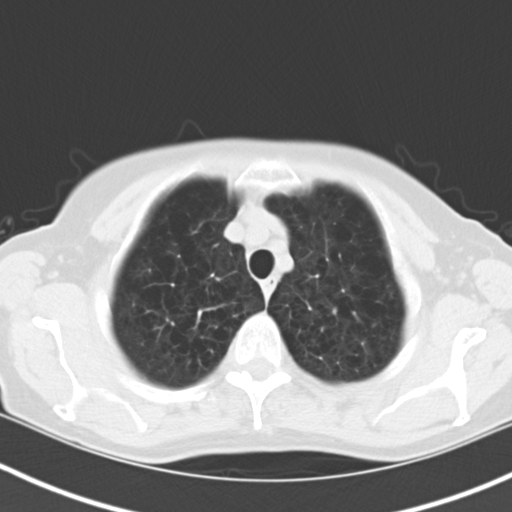
[im 58/63  lung]
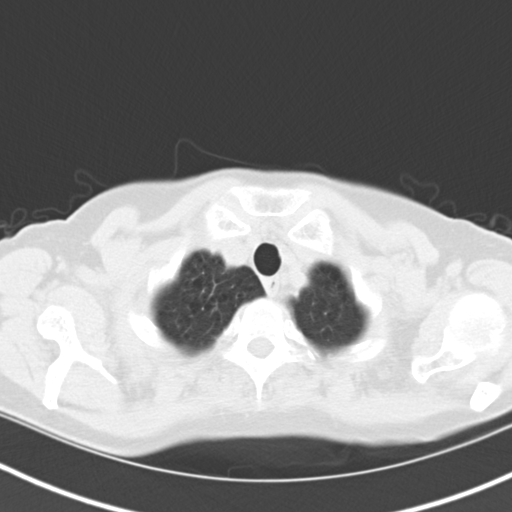

[Series 5: mpr coro 3mm · coronal · 0.64mm/px · 3 of 78 slices shown]
[im 16/78  lung]
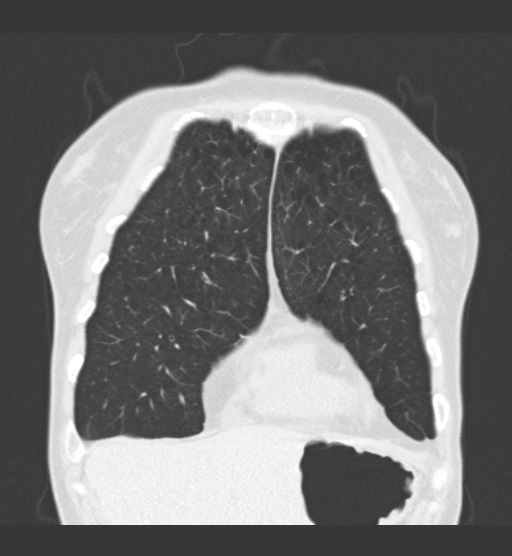
[im 31/78  lung]
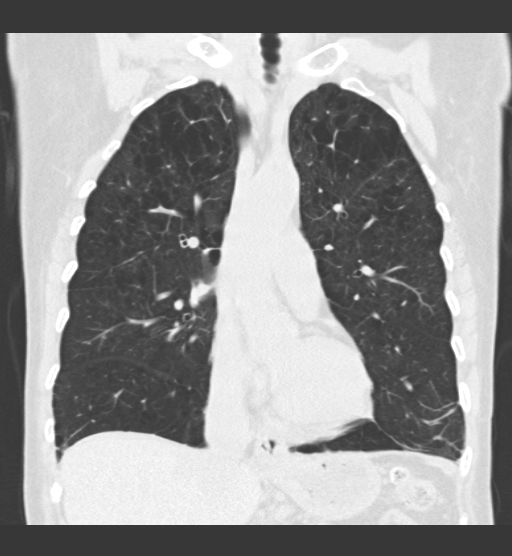
[im 47/78  lung]
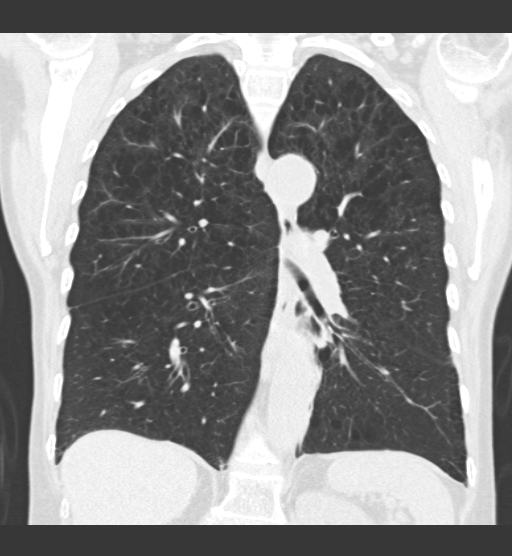

[15 of 36 positions shown; findings below may reference images not displayed]

FINDINGS: Severe pulmonary emphysema again noted.  Pleural -
parenchymal scarring in both lung bases is stable.

A peripheral nodule is again seen in the superior segment of the
left lower lobe on image 41 and is stable.  No new, enlarging, are
otherwise suspicious pulmonary nodules are identified.

There is no evidence of pulmonary infiltrate or central
endobronchial lesion.  No evidence of pleural or pericardial
effusion.  Mild pericardial thickening is stable.  No evidence of
hilar mediastinal masses on this noncontrast study.  No adenopathy
seen elsewhere within the thorax.

Adrenal glands are incompletely visualized on the study.  No
suspicious bone lesions are identified.
IMPRESSION: 1.  Stable 8 mm subpleural pulmonary nodule in the superior segment
of the left lower lobe.
2.  No new or progressive disease.
3.  Stable bibasilar scarring and severe pulmonary emphysema.

## 2014-09-21 ENCOUNTER — Telehealth: Payer: Self-pay | Admitting: *Deleted

## 2014-09-21 NOTE — Telephone Encounter (Signed)
Received call from patient.   Reports that she stumped her toe on coffee table and fell into wall. Reports that she thinks she sprained muscle in side when she fell.   Reports that she now has noted that her oxygen saturation is dropping while she is up moving about, but when she sits down and rests, it returns to normal.   Requested to have MD advise.

## 2014-09-23 NOTE — Telephone Encounter (Signed)
NTBS to evaluate for possible cause.

## 2014-09-23 NOTE — Telephone Encounter (Signed)
left patient message the she needs to be seen

## 2014-09-27 ENCOUNTER — Encounter: Payer: Self-pay | Admitting: Family Medicine

## 2014-09-27 ENCOUNTER — Encounter: Payer: Self-pay | Admitting: Gastroenterology

## 2014-09-28 ENCOUNTER — Telehealth (HOSPITAL_COMMUNITY): Payer: Self-pay | Admitting: *Deleted

## 2014-09-28 ENCOUNTER — Telehealth: Payer: Self-pay

## 2014-09-28 NOTE — Telephone Encounter (Signed)
Patient reports urgent care visit for congestion/cough. Chest x-ray verbally reported  to her that it showed scarring vs pneumonia treated with antibiotic. She is scheduled for CT scan in January 21st

## 2014-09-28 NOTE — Telephone Encounter (Signed)
I called pt. She has not had a good BM for one week. She just has little balls some on some days. She is taking Amitiza 24 mcg bid.  She no longer takes the Miralax. Please advise!

## 2014-09-28 NOTE — Telephone Encounter (Signed)
Pt would like for DS to call her. She has question about her constipation. Her call back number is [phone]

## 2014-09-28 NOTE — Telephone Encounter (Signed)
Add supplemental fiber such as Metamucil or Benefiber daily. Drink at least 8 glasses of water a day. Start a probiotic. Add Miralax once to twice per day in addition to Amitiza. Non-urgent OV.

## 2014-09-29 ENCOUNTER — Encounter: Payer: Self-pay | Admitting: Gastroenterology

## 2014-09-29 NOTE — Telephone Encounter (Signed)
Pt is aware. Routing to [place] to schedule OV appt.

## 2014-09-29 NOTE — Telephone Encounter (Signed)
APPOINTMENT MADE AND LETTER SENT °

## 2014-10-03 ENCOUNTER — Encounter: Payer: Self-pay | Admitting: Family Medicine

## 2014-10-04 ENCOUNTER — Other Ambulatory Visit: Payer: Self-pay | Admitting: Family Medicine

## 2014-10-04 ENCOUNTER — Encounter: Payer: Self-pay | Admitting: Family Medicine

## 2014-10-04 MED ORDER — NYSTATIN 100000 UNIT/ML MT SUSP: 5.0000 mL | Freq: Four times a day (QID) | OROMUCOSAL | Status: DC

## 2014-10-05 ENCOUNTER — Encounter: Payer: Self-pay | Admitting: Family Medicine

## 2014-10-05 ENCOUNTER — Ambulatory Visit (INDEPENDENT_AMBULATORY_CARE_PROVIDER_SITE_OTHER): Payer: Medicare HMO | Admitting: Family Medicine

## 2014-10-05 VITALS — BP 108/64 | HR 80 | Temp 98.0°F | Resp 18 | Ht 66.0 in | Wt 133.0 lb

## 2014-10-05 DIAGNOSIS — J441 Chronic obstructive pulmonary disease with (acute) exacerbation: Secondary | ICD-10-CM

## 2014-10-05 MED ORDER — PREDNISONE 20 MG PO TABS: ORAL_TABLET | ORAL | Status: DC

## 2014-10-05 MED ORDER — ALBUTEROL SULFATE HFA 108 (90 BASE) MCG/ACT IN AERS: 2.0000 | INHALATION_SPRAY | Freq: Four times a day (QID) | RESPIRATORY_TRACT | Status: DC | PRN

## 2014-10-05 MED ORDER — LEVOFLOXACIN 500 MG PO TABS: 500.0000 mg | ORAL_TABLET | Freq: Every day | ORAL | Status: DC

## 2014-10-05 NOTE — Progress Notes (Signed)
Subjective:    Patient ID: [name], female    DOB: [birth year], 73 y.o.   MRN: [id number]  HPI Patient has a history of lung cancer her left lower lobe. In 2012 she underwent left lower lobe lung resection for treatment of this cancer. She also has a 30+ pack year history of smoking although she is quit. Patient states that she has had dyspnea on exertion for quite some time. However it is acutely worsened since her fall that she experienced in July. She reports oxygen desaturations with any activity. On room air today she is 88%. With ambulation she quickly drops to 82% walking less than 30 feet. On 2 L via nasal cannula oxygen level rises to 95% at rest. She was recently seen in urgent care and was diagnosed with left lower lobe pneumonia and was started on Levaquin for 10 days. Her cough is improving however on examination today she has bibasilar crackles and rales, decreased breath sounds and occasional expiratory wheezing. Past Medical History  Diagnosis Date  . COPD (chronic obstructive pulmonary disease)   . Anxiety disorder   . Hypothyroidism   . PUD (peptic ulcer disease)     68yrs  . Dysphagia   . IBS (irritable bowel syndrome)   . Diverticulosis   . Trigeminal neuralgia   . Diastolic dysfunction   . Allergic rhinitis   . 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 Hiatal hernia   . Aortic aneurysm   . Complication of anesthesia 2011    bloodpressure dropped during colonoscopy, not problems since  . Shortness of breath     with exertion  . Aneurysm of carotid artery     corrected by surgery 08/21/13  . Compression fracture   . On home O2 12/15/2013    chronic hypoxia  . Depression    Past Surgical History  Procedure Laterality Date  . 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 Esophagogastroduodenoscopy  11/13/2009    w/dilation to 14mm, gastric ulceration (H.Pylori) s/p treatment  . Esophagogastroduodenoscopy   11/21/2009    distal esophageal web, gastritis  . Lung cancer surgery  12/2010    Left VATS, minithoracotomy, LLL superior segmentectomy  . Vocal cord surgery  02/06/2011    laryngoscopy with bilateral vocal cord Radiesse injection for vocal cord paralysis  . Abdominal hysterectomy    . Cholecystectomy    . Tubal ligation    . Cataract extraction    . Fatty tumor removal from lt groin    . Cataract extraction w/phaco  09/02/2012    Procedure: CATARACT EXTRACTION PHACO AND INTRAOCULAR LENS PLACEMENT (IOC);  Surgeon: Elta Guadeloupe T. Gershon Crane, MD;  Location: [facility];  Service: Ophthalmology;  Laterality: Left;  CDE:10.35  . Endarterectomy Right 08/21/2013    Procedure: RIGHT CAROTID ANEURYSM RESECTION;  Surgeon: Rosetta Posner, MD;  Location: [facility] OR;  Service: Vascular;  Laterality: Right;  . Fracture surgery Left     hand and left ring finger  . Orif wrist fracture Left 04/09/2014    Procedure: OPEN REDUCTION INTERNAL FIXATION (ORIF) WRIST FRACTURE;  Surgeon: Renette Butters, MD;  Location: [place];  Service: Orthopedics;  Laterality: Left;  . Percutaneous pinning Left 04/09/2014    Procedure: PERCUTANEOUS PINNING EXTREMITY;  Surgeon: Renette Butters, MD;  Location: [place];  Service: Orthopedics;  Laterality: Left;  . Esophagogastroduodenoscopy  03/14/12    WUJ:[id number] in the distal esophagus/Mild gastritis/small HH. +  H.pylori, prescribed Pylera. Finished treatment.   . Back surgery  January 13, 2014   Current Outpatient Prescriptions on File Prior to Visit  Medication Sig Dispense Refill  . alendronate (FOSAMAX) 70 MG tablet Take 1 tablet (70 mg total) by mouth once a week. Take with a full glass of water on an empty stomach. Take on sundays 12 tablet 4  . atorvastatin (LIPITOR) 20 MG tablet Take 1 tablet (20 mg total) by mouth daily. 90 tablet 3  . calcium citrate-vitamin D (CITRACAL+D) 315-200 MG-UNIT per tablet Take 1 tablet by mouth daily.     . Cholecalciferol (VITAMIN D-3) 5000 UNITS TABS Take 5,000  Units by mouth daily.     . citalopram (CELEXA) 40 MG tablet Take 1 tablet (40 mg total) by mouth at bedtime. 90 tablet 4  . cyclobenzaprine (FLEXERIL) 10 MG tablet Take 1 tablet (10 mg total) by mouth every 8 (eight) hours as needed for muscle spasms. 30 tablet 4  . docusate sodium (COLACE) 100 MG capsule Take 100 mg by mouth daily.    . fish oil-omega-3 fatty acids 1000 MG capsule Take 1 g by mouth daily.     Marland Kitchen ibuprofen (ADVIL,MOTRIN) 200 MG tablet Take 400 mg by mouth every 6 (six) hours as needed (for pain).    Marland Kitchen levothyroxine (SYNTHROID, LEVOTHROID) 25 MCG tablet Take 1 tablet (25 mcg total) by mouth daily before breakfast. 90 tablet 3  . lubiprostone (AMITIZA) 8 MCG capsule Take 1 capsule (8 mcg total) by mouth 2 (two) times daily with a meal. 60 capsule 3  . Multiple Vitamin (MULTIVITAMIN WITH MINERALS) TABS Take 1 tablet by mouth daily.    Marland Kitchen nystatin (MYCOSTATIN) 100000 UNIT/ML suspension Take 5 mLs (500,000 Units total) by mouth 4 (four) times daily. 120 mL 0  . ondansetron (ZOFRAN) 4 MG tablet Take 1 tablet (4 mg total) by mouth every 8 (eight) hours as needed for nausea or vomiting. 30 tablet 1  . pantoprazole (PROTONIX) 40 MG tablet Take 1 tablet (40 mg total) by mouth 2 (two) times daily before a meal. With your antibiotics for 10 days. 20 tablet 0  . polyethylene glycol (MIRALAX / GLYCOLAX) packet Take 17 g by mouth daily.    . promethazine (PHENERGAN) 12.5 MG tablet Take 1 tablet (12.5 mg total) by mouth every 6 (six) hours as needed for nausea or vomiting. 30 tablet 0  . rifabutin (MYCOBUTIN) 150 MG capsule Take 2 capsules (300 mg total) by mouth 2 (two) times daily. Take with Amoxicllin and Protonix twice a day. 40 capsule 0  . senna-docusate (SENOKOT-S) 8.6-50 MG per tablet Take 1 tablet by mouth at bedtime.      No current facility-administered medications on file prior to visit.   Allergies  Allergen Reactions  . Ciprofloxacin Hcl Hives  . Iohexol      Desc: [facility], pt is allergic to IV contrast, no type of reaction was available. Pt cannot remember but first reacted around 15 yrs ago from an IVP. Notes were date from 2009 at urology center., Onset Date: [id number]   . Prozac [Fluoxetine Hcl] Rash  . Sulfonamide Derivatives Hives and Rash   History   Social History  . Marital Status: Divorced    Spouse Name: N/A    Number of Children: N/A  . Years of Education: N/A   Occupational History  . Not on file.   Social History Main Topics  . Smoking status: Former Smoker -- 0.50 packs/day for  20 years    Types: Cigarettes    Quit date: 12/21/2010  . Smokeless tobacco: Never Used     Comment: smoking cessation info given and reviewed   . Alcohol Use: Yes     Comment: seldom  . Drug Use: No  . Sexual Activity: Yes    Birth Control/ Protection: Surgical   Other Topics Concern  . Not on file   Social History Narrative      Review of Systems  All other systems reviewed and are negative.      Objective:   Physical Exam  Cardiovascular: Normal rate, regular rhythm and normal heart sounds.   Pulmonary/Chest: She has decreased breath sounds. She has wheezes. She has rales.  Abdominal: Soft. Bowel sounds are normal.  Musculoskeletal: She exhibits no edema.  Vitals reviewed.   Pulmonary function tests show an FEV1 to FVC ratio of 29%. FEV1 is 0.77 L consistent with 36% of predicted. This would qualify the patient for borderline stage IV COPD      Assessment & Plan:  COPD exacerbation - Plan: predniSONE (DELTASONE) 20 MG tablet, levofloxacin (LEVAQUIN) 500 MG tablet, albuterol (PROVENTIL HFA;VENTOLIN HFA) 108 (90 BASE) MCG/ACT inhaler  Patient has hypoxia and apparently a COPD exacerbation. I will start the patient on a prednisone taper pack in addition I will continue her Levaquin for another 7 days. I will start the patient on albuterol 2 puffs inhaled every 4-6 hours as needed. Also arranged for the patient to have 2 L of oxygen  via nasal cannula at home. Recheck in one week or immediately if worse.

## 2014-10-11 ENCOUNTER — Ambulatory Visit (INDEPENDENT_AMBULATORY_CARE_PROVIDER_SITE_OTHER): Payer: PPO | Admitting: Family Medicine

## 2014-10-11 ENCOUNTER — Encounter: Payer: Self-pay | Admitting: Family Medicine

## 2014-10-11 VITALS — BP 120/70 | HR 78 | Temp 97.7°F | Resp 18 | Ht 66.0 in | Wt 131.0 lb

## 2014-10-11 DIAGNOSIS — J441 Chronic obstructive pulmonary disease with (acute) exacerbation: Secondary | ICD-10-CM

## 2014-10-11 NOTE — Progress Notes (Signed)
Subjective:    Patient ID: [name], female    DOB: 04/15/1941, 74 y.o.   MRN: [id number]  HPI 10/05/14 Patient has a history of lung cancer her left lower lobe. In 2012 she underwent left lower lobe lung resection for treatment of this cancer. She also has a 30+ pack year history of smoking although she is quit. Patient states that she has had dyspnea on exertion for quite some time. However it is acutely worsened since her fall that she experienced in July. She reports oxygen desaturations with any activity. On room air today she is 88%. With ambulation she quickly drops to 82% walking less than 30 feet. On 2 L via nasal cannula oxygen level rises to 95% at rest. She was recently seen in urgent care and was diagnosed with left lower lobe pneumonia and was started on Levaquin for 10 days. Her cough is improving however on examination today she has bibasilar crackles and rales, decreased breath sounds and occasional expiratory wheezing.  At that time, my plan was:  Patient has hypoxia and apparently a COPD exacerbation. I will start the patient on a prednisone taper pack in addition I will continue her Levaquin for another 7 days. I will start the patient on albuterol 2 puffs inhaled every 4-6 hours as needed. Also arranged for the patient to have 2 L of oxygen via nasal cannula at home. Recheck in one week or immediately if worse.  10/11/14 Patient's breathing seems much better today. Pulse oximetry shows a SPO2 of 95% on 2 L at rest. She is 93% on room air at rest. With ambulation over 120 feet her pulse oximetry drops to 87% on room air but this is much improved compared to last week when she dropped to 82% after only 30 feet. She still has some faint Rales in the left lower lobe but it has much improved.  Past Medical History  Diagnosis Date  . COPD (chronic obstructive pulmonary disease)   . Anxiety disorder   . Hypothyroidism   . PUD (peptic ulcer disease)     95yrs  . Dysphagia   .  IBS (irritable bowel syndrome)   . Diverticulosis   . Trigeminal neuralgia   . Diastolic dysfunction   . Allergic rhinitis   . 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 Hiatal hernia   . Aortic aneurysm   . Complication of anesthesia 2011    bloodpressure dropped during colonoscopy, not problems since  . Shortness of breath     with exertion  . Aneurysm of carotid artery     corrected by surgery 08/21/13  . Compression fracture   . On home O2 12/15/2013    chronic hypoxia  . Depression    Past Surgical History  Procedure Laterality Date  . 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 Esophagogastroduodenoscopy  11/13/2009    w/dilation to 62mm, gastric ulceration (H.Pylori) s/p treatment  . Esophagogastroduodenoscopy  11/21/2009    distal esophageal web, gastritis  . Lung cancer surgery  12/2010    Left VATS, minithoracotomy, LLL superior segmentectomy  . Vocal cord surgery  02/06/2011    laryngoscopy with bilateral vocal cord Radiesse injection for vocal cord paralysis  . Abdominal hysterectomy    . Cholecystectomy    . Tubal ligation    . Cataract extraction    . Fatty tumor removal from lt groin    . Cataract extraction  w/phaco  09/02/2012    Procedure: CATARACT EXTRACTION PHACO AND INTRAOCULAR LENS PLACEMENT (IOC);  Surgeon: Elta Guadeloupe T. Gershon Crane, MD;  Location: [facility];  Service: Ophthalmology;  Laterality: Left;  CDE:10.35  . Endarterectomy Right 08/21/2013    Procedure: RIGHT CAROTID ANEURYSM RESECTION;  Surgeon: Rosetta Posner, MD;  Location: [facility];  Service: Vascular;  Laterality: Right;  . Fracture surgery Left     hand and left ring finger  . Orif wrist fracture Left 04/09/2014    Procedure: OPEN REDUCTION INTERNAL FIXATION (ORIF) WRIST FRACTURE;  Surgeon: Renette Butters, MD;  Location: [place];  Service: Orthopedics;  Laterality: Left;  . Percutaneous pinning Left 04/09/2014    Procedure: PERCUTANEOUS PINNING EXTREMITY;  Surgeon:  Renette Butters, MD;  Location: [place];  Service: Orthopedics;  Laterality: Left;  . Esophagogastroduodenoscopy  03/14/12    YDX:AJOINOMVE in the distal esophagus/Mild gastritis/small HH. + H.pylori, prescribed Pylera. Finished treatment.   . Back surgery  January 13, 2014   Current Outpatient Prescriptions on File Prior to Visit  Medication Sig Dispense Refill  . albuterol (PROVENTIL HFA;VENTOLIN HFA) 108 (90 BASE) MCG/ACT inhaler Inhale 2 puffs into the lungs every 6 (six) hours as needed for wheezing or shortness of breath. 1 Inhaler 0  . alendronate (FOSAMAX) 70 MG tablet Take 1 tablet (70 mg total) by mouth once a week. Take with a full glass of water on an empty stomach. Take on sundays 12 tablet 4  . atorvastatin (LIPITOR) 20 MG tablet Take 1 tablet (20 mg total) by mouth daily. 90 tablet 3  . calcium citrate-vitamin D (CITRACAL+D) 315-200 MG-UNIT per tablet Take 1 tablet by mouth daily.     . Cholecalciferol (VITAMIN D-3) 5000 UNITS TABS Take 5,000 Units by mouth daily.     . citalopram (CELEXA) 40 MG tablet Take 1 tablet (40 mg total) by mouth at bedtime. 90 tablet 4  . cyclobenzaprine (FLEXERIL) 10 MG tablet Take 1 tablet (10 mg total) by mouth every 8 (eight) hours as needed for muscle spasms. 30 tablet 4  . docusate sodium (COLACE) 100 MG capsule Take 100 mg by mouth daily.    . fish oil-omega-3 fatty acids 1000 MG capsule Take 1 g by mouth daily.     Marland Kitchen ibuprofen (ADVIL,MOTRIN) 200 MG tablet Take 400 mg by mouth every 6 (six) hours as needed (for pain).    Marland Kitchen levofloxacin (LEVAQUIN) 500 MG tablet Take 1 tablet (500 mg total) by mouth daily. 7 tablet 0  . levothyroxine (SYNTHROID, LEVOTHROID) 25 MCG tablet Take 1 tablet (25 mcg total) by mouth daily before breakfast. 90 tablet 3  . lubiprostone (AMITIZA) 8 MCG capsule Take 1 capsule (8 mcg total) by mouth 2 (two) times daily with a meal. 60 capsule 3  . Multiple Vitamin (MULTIVITAMIN WITH MINERALS) TABS Take 1 tablet by mouth daily.    Marland Kitchen  nystatin (MYCOSTATIN) 100000 UNIT/ML suspension Take 5 mLs (500,000 Units total) by mouth 4 (four) times daily. 120 mL 0  . ondansetron (ZOFRAN) 4 MG tablet Take 1 tablet (4 mg total) by mouth every 8 (eight) hours as needed for nausea or vomiting. 30 tablet 1  . pantoprazole (PROTONIX) 40 MG tablet Take 1 tablet (40 mg total) by mouth 2 (two) times daily before a meal. With your antibiotics for 10 days. 20 tablet 0  . polyethylene glycol (MIRALAX / GLYCOLAX) packet Take 17 g by mouth daily.    . promethazine (PHENERGAN) 12.5 MG tablet Take 1 tablet (  12.5 mg total) by mouth every 6 (six) hours as needed for nausea or vomiting. 30 tablet 0  . rifabutin (MYCOBUTIN) 150 MG capsule Take 2 capsules (300 mg total) by mouth 2 (two) times daily. Take with Amoxicllin and Protonix twice a day. 40 capsule 0  . senna-docusate (SENOKOT-S) 8.6-50 MG per tablet Take 1 tablet by mouth at bedtime.      No current facility-administered medications on file prior to visit.   Allergies  Allergen Reactions  . Ciprofloxacin Hcl Hives  . Iohexol      Desc: Per [facility], pt is allergic to IV contrast, no type of reaction was available. Pt cannot remember but first reacted around 15 yrs ago from an IVP. Notes were date from 2009 at urology center., Onset Date: [id number]   . Prozac [Fluoxetine Hcl] Rash  . Sulfonamide Derivatives Hives and Rash   History   Social History  . Marital Status: Divorced    Spouse Name: N/A    Number of Children: N/A  . Years of Education: N/A   Occupational History  . Not on file.   Social History Main Topics  . Smoking status: Former Smoker -- 0.50 packs/day for 20 years    Types: Cigarettes    Quit date: 12/21/2010  . Smokeless tobacco: Never Used     Comment: smoking cessation info given and reviewed   . Alcohol Use: Yes     Comment: seldom  . Drug Use: No  . Sexual Activity: Yes    Birth Control/ Protection: Surgical   Other Topics Concern  . Not on file    Social History Narrative      Review of Systems  All other systems reviewed and are negative.      Objective:   Physical Exam  Cardiovascular: Normal rate, regular rhythm and normal heart sounds.   Pulmonary/Chest: She has no decreased breath sounds. She has no wheezes. She has rales in the left lower field.  Abdominal: Soft. Bowel sounds are normal.  Musculoskeletal: She exhibits no edema.  Vitals reviewed.  10/05/14 Pulmonary function tests show an FEV1 to FVC ratio of 29%. FEV1 is 0.77 L consistent with 36% of predicted. This would qualify the patient for borderline stage IV COPD  10/11/13 Repeat pulmonary function test performed today with a patient in a more stable condition reveal     FEV1 to FVC ratio 33% with an FEV1 of 0.85 L consistent with 40% of predicted. Therefore the patient has stage III COPD Assessment & Plan:  COPD exacerbation  Patient has improved dramatically. She still requires oxygen with ambulation she drops to 87% on room air with ambulation. However her trajectory shows significant improvement in just one week. I would like to continue oxygen for an additional 1-2 weeks with ambulation and then I believe she may be able to discontinue the oxygen altogether. I will recheck the patient 2 weeks. Based on the results of her pulmonary function test, I will start the patient on stiolto 2 inh qday.

## 2014-10-15 ENCOUNTER — Encounter: Payer: Self-pay | Admitting: Family Medicine

## 2014-10-20 ENCOUNTER — Encounter: Payer: Self-pay | Admitting: Family Medicine

## 2014-10-25 ENCOUNTER — Encounter: Payer: Self-pay | Admitting: Family Medicine

## 2014-10-25 ENCOUNTER — Ambulatory Visit (INDEPENDENT_AMBULATORY_CARE_PROVIDER_SITE_OTHER): Payer: PPO | Admitting: Family Medicine

## 2014-10-25 VITALS — BP 110/68 | HR 74 | Temp 98.0°F | Resp 20 | Ht 66.0 in | Wt 133.0 lb

## 2014-10-25 DIAGNOSIS — K5900 Constipation, unspecified: Secondary | ICD-10-CM

## 2014-10-25 DIAGNOSIS — K5909 Other constipation: Secondary | ICD-10-CM

## 2014-10-25 DIAGNOSIS — J441 Chronic obstructive pulmonary disease with (acute) exacerbation: Secondary | ICD-10-CM

## 2014-10-25 LAB — LIPID PANEL
Cholesterol: 217 mg/dL — ABNORMAL HIGH (ref 0–200)
HDL: 73 mg/dL (ref >39)
LDL Cholesterol: 127 mg/dL — ABNORMAL HIGH (ref 0–99)
Total CHOL/HDL Ratio: 3 Ratio
Triglycerides: 86 mg/dL (ref <150)
VLDL: 17 mg/dL (ref 0–40)

## 2014-10-25 LAB — CBC WITH DIFFERENTIAL/PLATELET
Basophils Absolute: 0.1 10*3/uL (ref 0.0–0.1)
Basophils Relative: 1 % (ref 0–1)
Eosinophils Absolute: 0.3 10*3/uL (ref 0.0–0.7)
Eosinophils Relative: 4 % (ref 0–5)
HCT: 40.9 % (ref 36.0–46.0)
Hemoglobin: 13.2 g/dL (ref 12.0–15.0)
Lymphocytes Relative: 23 % (ref 12–46)
Lymphs Abs: 1.4 10*3/uL (ref 0.7–4.0)
MCH: 28.8 pg (ref 26.0–34.0)
MCHC: 32.3 g/dL (ref 30.0–36.0)
MCV: 89.3 fL (ref 78.0–100.0)
MPV: 8.8 fL (ref 8.6–12.4)
Monocytes Absolute: 0.5 10*3/uL (ref 0.1–1.0)
Monocytes Relative: 8 % (ref 3–12)
Neutro Abs: 4 10*3/uL (ref 1.7–7.7)
Neutrophils Relative %: 64 % (ref 43–77)
Platelets: 338 10*3/uL (ref 150–400)
RBC: 4.58 MIL/uL (ref 3.87–5.11)
RDW: 14.1 % (ref 11.5–15.5)
WBC: 6.3 10*3/uL (ref 4.0–10.5)

## 2014-10-25 LAB — TSH: TSH: 3.596 u[IU]/mL (ref 0.350–4.500)

## 2014-10-25 LAB — COMPLETE METABOLIC PANEL WITH GFR
ALT: 11 U/L (ref 0–35)
AST: 16 U/L (ref 0–37)
Albumin: 4 g/dL (ref 3.5–5.2)
Alkaline Phosphatase: 91 U/L (ref 39–117)
BUN: 7 mg/dL (ref 6–23)
CO2: 29 mEq/L (ref 19–32)
Calcium: 9.7 mg/dL (ref 8.4–10.5)
Chloride: 97 mEq/L (ref 96–112)
Creat: 0.62 mg/dL (ref 0.50–1.10)
GFR, Est African American: >89 mL/min
GFR, Est Non African American: >89 mL/min
Glucose, Bld: 78 mg/dL (ref 70–99)
Potassium: 4.5 mEq/L (ref 3.5–5.3)
Sodium: 135 mEq/L (ref 135–145)
Total Bilirubin: 0.6 mg/dL (ref 0.2–1.2)
Total Protein: 7 g/dL (ref 6.0–8.3)

## 2014-10-25 NOTE — Progress Notes (Signed)
Subjective:    Patient ID: [name], female    DOB: 29-Jun-1941, 74 y.o.   MRN: [id number]  HPI 10/05/14 Patient has a history of lung cancer her left lower lobe. In 2012 she underwent left lower lobe lung resection for treatment of this cancer. She also has a 30+ pack year history of smoking although she is quit. Patient states that she has had dyspnea on exertion for quite some time. However it is acutely worsened since her fall that she experienced in July. She reports oxygen desaturations with any activity. On room air today she is 88%. With ambulation she quickly drops to 82% walking less than 30 feet. On 2 L via nasal cannula oxygen level rises to 95% at rest. She was recently seen in urgent care and was diagnosed with left lower lobe pneumonia and was started on Levaquin for 10 days. Her cough is improving however on examination today she has bibasilar crackles and rales, decreased breath sounds and occasional expiratory wheezing.  At that time, my plan was:  Patient has hypoxia and apparently a COPD exacerbation. I will start the patient on a prednisone taper pack in addition I will continue her Levaquin for another 7 days. I will start the patient on albuterol 2 puffs inhaled every 4-6 hours as needed. Also arranged for the patient to have 2 L of oxygen via nasal cannula at home. Recheck in one week or immediately if worse.  10/11/14 Patient's breathing seems much better today. Pulse oximetry shows a SPO2 of 95% on 2 L at rest. She is 93% on room air at rest. With ambulation over 120 feet her pulse oximetry drops to 87% on room air but this is much improved compared to last week when she dropped to 82% after only 30 feet. She still has some faint Rales in the left lower lobe but it has much improved.   At that time, my plan was: Patient has improved dramatically. She still requires oxygen with ambulation she drops to 87% on room air with ambulation. However her trajectory shows significant  improvement in just one week. I would like to continue oxygen for an additional 1-2 weeks with ambulation and then I believe she may be able to discontinue the oxygen altogether. I will recheck the patient 2 weeks. Based on the results of her pulmonary function test, I will start the patient on stiolto 2 inh qday.   10/25/13 Patient's progress has plateaued. She is 92% on room air. However with ambulation she quickly drops to 86%. On examination today she continues to have faint left basilar crackles Rales. However there is no wheezing to explain the patient's hypoxia such as though was at her previous office visits. She has not been completely compliant with stiolto but she denies any benefit on this medication. She is scheduled to have a CT scan of her lungs on Thursday as a follow-up annually for her lung cancer. She also complains of chronic constipation for several months. She is only able to go if she takes stimulant laxatives. She also complains of left lower quadrant abdominal pain. She denies any melena or hematochezia. She has tried and failed Amitiza. She is tried and failed MiraLAX. Past Medical History  Diagnosis Date  . COPD (chronic obstructive pulmonary disease)   . Anxiety disorder   . Hypothyroidism   . PUD (peptic ulcer disease)     53yrs  . Dysphagia   . IBS (irritable bowel syndrome)   . Diverticulosis   .  Trigeminal neuralgia   . Diastolic dysfunction   . Allergic rhinitis   . 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 Hiatal hernia   . Aortic aneurysm   . Complication of anesthesia 2011    bloodpressure dropped during colonoscopy, not problems since  . Shortness of breath     with exertion  . Aneurysm of carotid artery     corrected by surgery 08/21/13  . Compression fracture   . On home O2 12/15/2013    chronic hypoxia  . Depression    Past Surgical History  Procedure Laterality Date  . Colonoscopy  2011    hyperplastic polyp, 3-4 small cecal  AVMs, nonbleeding  . Esophagogastroduodenoscopy  11/13/2009    w/dilation to 37mm, gastric ulceration (H.Pylori) s/p treatment  . Esophagogastroduodenoscopy  11/21/2009    distal esophageal web, gastritis  . Lung cancer surgery  12/2010    Left VATS, minithoracotomy, LLL superior segmentectomy  . Vocal cord surgery  02/06/2011    laryngoscopy with bilateral vocal cord Radiesse injection for vocal cord paralysis  . Abdominal hysterectomy    . Cholecystectomy    . Tubal ligation    . Cataract extraction    . Fatty tumor removal from lt groin    . Cataract extraction w/phaco  09/02/2012    Procedure: CATARACT EXTRACTION PHACO AND INTRAOCULAR LENS PLACEMENT (IOC);  Surgeon: Elta Guadeloupe T. Gershon Crane, MD;  Location: [facility];  Service: Ophthalmology;  Laterality: Left;  CDE:10.35  . Endarterectomy Right 08/21/2013    Procedure: RIGHT CAROTID ANEURYSM RESECTION;  Surgeon: Rosetta Posner, MD;  Location[facility] OR;  Service: Vascular;  Laterality: Right;  . Fracture surgery Left     hand and left ring finger  . Orif wrist fracture Left 04/09/2014    Procedure: OPEN REDUCTION INTERNAL FIXATION (ORIF) WRIST FRACTURE;  Surgeon: Renette Butters, MD;  Location: [place];  Service: Orthopedics;  Laterality: Left;  . Percutaneous pinning Left 04/09/2014    Procedure: PERCUTANEOUS PINNING EXTREMITY;  Surgeon: Renette Butters, MD;  Location: [place];  Service: Orthopedics;  Laterality: Left;  . Esophagogastroduodenoscopy  03/14/12    EXB:[id number] in the distal esophagus/Mild gastritis/small HH. + H.pylori, prescribed Pylera. Finished treatment.   . Back surgery  January 13, 2014   Current Outpatient Prescriptions on File Prior to Visit  Medication Sig Dispense Refill  . albuterol (PROVENTIL HFA;VENTOLIN HFA) 108 (90 BASE) MCG/ACT inhaler Inhale 2 puffs into the lungs every 6 (six) hours as needed for wheezing or shortness of breath. 1 Inhaler 0  . calcium citrate-vitamin D (CITRACAL+D) 315-200 MG-UNIT per tablet Take 1 tablet by mouth  daily.     . Cholecalciferol (VITAMIN D-3) 5000 UNITS TABS Take 5,000 Units by mouth daily.     . citalopram (CELEXA) 40 MG tablet Take 1 tablet (40 mg total) by mouth at bedtime. 90 tablet 4  . cyclobenzaprine (FLEXERIL) 10 MG tablet Take 1 tablet (10 mg total) by mouth every 8 (eight) hours as needed for muscle spasms. 30 tablet 4  . docusate sodium (COLACE) 100 MG capsule Take 100 mg by mouth daily.    . fish oil-omega-3 fatty acids 1000 MG capsule Take 1 g by mouth daily.     Marland Kitchen ibuprofen (ADVIL,MOTRIN) 200 MG tablet Take 400 mg by mouth every 6 (six) hours as needed (for pain).    Marland Kitchen levothyroxine (SYNTHROID, LEVOTHROID) 25 MCG tablet Take 1 tablet (25 mcg total) by mouth daily before breakfast. 90 tablet  3  . Multiple Vitamin (MULTIVITAMIN WITH MINERALS) TABS Take 1 tablet by mouth daily.    Marland Kitchen alendronate (FOSAMAX) 70 MG tablet Take 1 tablet (70 mg total) by mouth once a week. Take with a full glass of water on an empty stomach. Take on sundays (Patient not taking: Reported on 10/25/2014) 12 tablet 4  . pantoprazole (PROTONIX) 40 MG tablet Take 1 tablet (40 mg total) by mouth 2 (two) times daily before a meal. With your antibiotics for 10 days. (Patient not taking: Reported on 10/25/2014) 20 tablet 0  . promethazine (PHENERGAN) 12.5 MG tablet Take 1 tablet (12.5 mg total) by mouth every 6 (six) hours as needed for nausea or vomiting. (Patient not taking: Reported on 10/25/2014) 30 tablet 0   No current facility-administered medications on file prior to visit.   Allergies  Allergen Reactions  . Ciprofloxacin Hcl Hives  . Iohexol      Desc: Per [facility], pt is allergic to IV contrast, no type of reaction was available. Pt cannot remember but first reacted around 15 yrs ago from an IVP. Notes were date from 2009 at urology center., Onset Date: [id number]   . Prozac [Fluoxetine Hcl] Rash  . Sulfonamide Derivatives Hives and Rash   History   Social History  . Marital Status: Divorced     Spouse Name: N/A    Number of Children: N/A  . Years of Education: N/A   Occupational History  . Not on file.   Social History Main Topics  . Smoking status: Former Smoker -- 0.50 packs/day for 20 years    Types: Cigarettes    Quit date: 12/21/2010  . Smokeless tobacco: Never Used     Comment: smoking cessation info given and reviewed   . Alcohol Use: Yes     Comment: seldom  . Drug Use: No  . Sexual Activity: Yes    Birth Control/ Protection: Surgical   Other Topics Concern  . Not on file   Social History Narrative      Review of Systems  All other systems reviewed and are negative.      Objective:   Physical Exam  Cardiovascular: Normal rate, regular rhythm and normal heart sounds.   Pulmonary/Chest: She has no decreased breath sounds. She has no wheezes. She has rales in the left lower field.  Abdominal: Soft. Bowel sounds are normal.  Musculoskeletal: She exhibits no edema.  Vitals reviewed.  10/05/14 Pulmonary function tests show an FEV1 to FVC ratio of 29%. FEV1 is 0.77 L consistent with 36% of predicted. This would qualify the patient for borderline stage IV COPD  10/11/13 Repeat pulmonary function test performed today with a patient in a more stable condition reveal     FEV1 to FVC ratio 33% with an FEV1 of 0.85 L consistent with 40% of predicted. Therefore the patient has stage III COPD Assessment & Plan:  Chronic constipation - Plan: COMPLETE METABOLIC PANEL WITH GFR, CBC with Differential, Lipid panel, TSH  COPD exacerbation  I believe her left lower quadrant pain is likely chronic constipation. I will check her thyroid tests today to make sure that her levothyroxine is appropriately dosed. I will increase linzess to 290 mcg poqday.  Hopefully if I can get the patient to go the restroom regularly, her abdominal pain will subside. If not we will proceed with a CT scan of the abdomen and possibly a colonoscopy. I would like the patient to get a CT scan of her  chest because  on examination today there is no definitive cause for her hypoxia. If her chest x-ray is stable, her hypoxia is due to COPD and also her previous lung resection for lung cancer and she will need chronic oxygen therapy.

## 2014-10-26 ENCOUNTER — Encounter: Payer: Self-pay | Admitting: *Deleted

## 2014-10-27 ENCOUNTER — Telehealth: Payer: Self-pay | Admitting: *Deleted

## 2014-10-27 NOTE — Telephone Encounter (Signed)
Received a fax from [facility] mgmt with [facility] for [facility]-Thorax W/O dye that ordered by Dr. Annia Belt authorization number [id number]  Procedure: CT- 27517- CT Thorax w/o dye  Dx: C34.90-Malignant neoplasm of unsp part of unsp bronchus or lung  Number of visits: 1  Start Date: 10/28/14 expires on 04/26/2015  [facility] and Dr. Philmore Pali has received a fax also.

## 2014-10-28 ENCOUNTER — Ambulatory Visit (HOSPITAL_COMMUNITY)
Admission: RE | Admit: 2014-10-28 | Discharge: 2014-10-28 | Disposition: A | Discharge status: Home / Self Care | Payer: PPO | Source: Ambulatory Visit | Attending: Oncology | Admitting: Oncology

## 2014-10-28 ENCOUNTER — Other Ambulatory Visit (HOSPITAL_COMMUNITY): Payer: Medicare HMO

## 2014-10-28 ENCOUNTER — Telehealth: Payer: Self-pay | Admitting: Family Medicine

## 2014-10-28 ENCOUNTER — Encounter: Payer: Self-pay | Admitting: Family Medicine

## 2014-10-28 DIAGNOSIS — Z923 Personal history of irradiation: Secondary | ICD-10-CM | POA: Insufficient documentation

## 2014-10-28 DIAGNOSIS — C3490 Malignant neoplasm of unspecified part of unspecified bronchus or lung: Secondary | ICD-10-CM | POA: Insufficient documentation

## 2014-10-28 DIAGNOSIS — Z08 Encounter for follow-up examination after completed treatment for malignant neoplasm: Secondary | ICD-10-CM | POA: Diagnosis not present

## 2014-10-28 DIAGNOSIS — J432 Centrilobular emphysema: Secondary | ICD-10-CM | POA: Insufficient documentation

## 2014-10-28 DIAGNOSIS — Z9221 Personal history of antineoplastic chemotherapy: Secondary | ICD-10-CM | POA: Insufficient documentation

## 2014-10-28 NOTE — Telephone Encounter (Signed)
Patient aware of results via mychart

## 2014-10-28 NOTE — Telephone Encounter (Signed)
Show severe copd.  Continue stiolto and oxygen.  Scarring on the left pleura.  No cancer.

## 2014-10-28 NOTE — Telephone Encounter (Signed)
Pt would like for you to review her CT scan done on 10/28/14 by [facility]. She will call their office as well for results but stated you told her you wanted to look at it.

## 2014-10-28 NOTE — Telephone Encounter (Signed)
Patient is calling to see if ct scan results were in  [phone]

## 2014-10-30 NOTE — Progress Notes (Signed)
-  Rescheduled-  [name]  

## 2014-10-30 NOTE — Assessment & Plan Note (Addendum)
Stage IB bronchoalveolar carcinoma,. Well-differentiated the left lower lobe status post resection on 12/21/2010. She had 14 lymph nodes negative and no perineural space invasion with clear margins and no LV I.  Thus far she has no evidence for recurrent disease or new disease. Recent CT chest on 10/28/2014 demonstrates NED but with left pleural thickening favored to benign.  Repeat imaging in 6 months and return in 6 months for follow-up.  If re-imaging of chest in 6 months is negative, will return to annual CT imaging per NCCN guidelines and and annual follow-up.

## 2014-11-01 ENCOUNTER — Encounter: Payer: Self-pay | Admitting: Family Medicine

## 2014-11-01 ENCOUNTER — Encounter (HOSPITAL_COMMUNITY): Payer: Self-pay | Admitting: Oncology

## 2014-11-01 ENCOUNTER — Ambulatory Visit (HOSPITAL_COMMUNITY): Payer: Medicare HMO | Admitting: Oncology

## 2014-11-02 ENCOUNTER — Telehealth: Payer: Self-pay

## 2014-11-02 NOTE — Telephone Encounter (Signed)
Pt is aware.  

## 2014-11-02 NOTE — Telephone Encounter (Signed)
Will need to discuss this more at her next visit. Could trial the Linzess once each morning with Miralax each evening.

## 2014-11-02 NOTE — Telephone Encounter (Signed)
Opened in error

## 2014-11-02 NOTE — Telephone Encounter (Signed)
Pt called and said she has had a lot of trouble with constipation. She has tried Amitiza 8 and 24 mcg and that did not help. PCP gave her samples of Linzess 290 mcg and that did not help.  She said the only thing that she can get to work is the [employer] and she has to take 2 of those at the time.   She has not had a good BM in several days and hates to keep doing the Correctol.  Her tummy feels sore.   She has recently had pneumonia and is on oxygen again.   She has appt with Laban Emperor, NP on 11/10/2014 at 10:00 AM.  Please advise!

## 2014-11-08 ENCOUNTER — Encounter (HOSPITAL_COMMUNITY): Payer: PPO | Attending: Oncology | Admitting: Oncology

## 2014-11-08 ENCOUNTER — Encounter (HOSPITAL_COMMUNITY): Payer: Self-pay | Admitting: Oncology

## 2014-11-08 VITALS — BP 102/65 | HR 78 | Temp 97.6°F | Resp 18 | Wt 138.5 lb

## 2014-11-08 DIAGNOSIS — Z85118 Personal history of other malignant neoplasm of bronchus and lung: Secondary | ICD-10-CM

## 2014-11-08 DIAGNOSIS — C3490 Malignant neoplasm of unspecified part of unspecified bronchus or lung: Secondary | ICD-10-CM

## 2014-11-08 DIAGNOSIS — J432 Centrilobular emphysema: Secondary | ICD-10-CM

## 2014-11-08 DIAGNOSIS — J439 Emphysema, unspecified: Secondary | ICD-10-CM

## 2014-11-08 HISTORY — DX: Emphysema, unspecified: J43.9

## 2014-11-08 NOTE — Patient Instructions (Signed)
[place] at [facility]  Discharge Instructions:  I reviewed your CT scan with you.  I printed a copy of the report for you. Repeat CT of chest in 6 months.  Return for follow-up in 6 months to review CT scan results.  _______________________________________________________________  Thank you for choosing [place] at [facility] to provide your oncology and hematology care.  To afford each patient quality time with our providers, please arrive at least 15 minutes before your scheduled appointment.  You need to re-schedule your appointment if you arrive 10 or more minutes late.  We strive to give you quality time with our providers, and arriving late affects you and other patients whose appointments are after yours.  Also, if you no show three or more times for appointments you may be dismissed from the clinic.  Again, thank you for choosing [place] [facility] [place] hope is that these requests will allow you access to exceptional care and in a timely manner. _______________________________________________________________  If you have questions after your visit, please contact our office at [phone] between the hours of 8:30 a.m. and 5:00 p.m. Voicemails left after 4:30 p.m. will not be returned until the following business day. _______________________________________________________________  For prescription refill requests, have your pharmacy contact our office. _______________________________________________________________  Recommendations made by the consultant and any test results will be sent to your referring physician. _______________________________________________________________

## 2014-11-08 NOTE — Progress Notes (Signed)
Ruth Fraction, MD [address] [place] [address]  Adenocarcinoma of lung, unspecified laterality - Plan: CT Chest Wo Contrast  CURRENT THERAPY: Surveillance per NCCN guidelines  INTERVAL HISTORY: [name] 74 y.o. female returns for followup of Stage IB bronchoalveolar carcinoma,. Well-differentiated the left lower lobe status post resection on 12/21/2010. She had 14 lymph nodes negative and no perineural space invasion with clear margins and no LV I. Thus far she has no evidence for recurrent disease or new disease.      Adenocarcinoma of lung   12/21/2010 Initial Diagnosis S/P left lower lobe resection for a well-differentiated Stage IB bronchoalveolar cancer. 0/14 lymph nodes.  No perineural invasion or LVI.  negative margins.   I personally reviewed and went over laboratory results with the patient.  The results are noted within this dictation.  I personally reviewed and went over radiographic studies with the patient.  The results are noted within this dictation.  CT of chest demonstrates increased nodular pleural parenchymal thickening at the left lung base is favored to represent benign pleural reaction.  NED.  With this information and her history of NSCLC, we will perform a CT of chest in 6 months.  If all is well at that time, then we will go back to annual surveillance per NCCN guidelines.   The patient is agreeable to this plan.  Unfortunately, she has had a bad 2015 year with multiple issues.  I hope 2016 is better for the patient.   Oncologically, she denies any complaints and ROS questioning is negative.  Past Medical History  Diagnosis Date  . COPD (chronic obstructive pulmonary disease)   . Anxiety disorder   . Hypothyroidism   . PUD (peptic ulcer disease)     79yrs  . Dysphagia   . IBS (irritable bowel syndrome)   . Diverticulosis   . Trigeminal neuralgia   . Diastolic dysfunction   . Allergic rhinitis   . Adenocarcinoma  of lung 12/2010    left lung/surg only  . Hiatal hernia   . Aortic aneurysm   . Complication of anesthesia 2011    bloodpressure dropped during colonoscopy, not problems since  . Shortness of breath     with exertion  . Aneurysm of carotid artery     corrected by surgery 08/21/13  . Compression fracture   . On home O2 12/15/2013    chronic hypoxia  . Depression     has CONTRAST DYE ALLERGY; Epigastric pain; Esophageal dysphagia; Trigeminal neuralgia; Adenocarcinoma of lung; Dyspepsia; Aneurysm; Abdominal aneurysm without mention of rupture; Aneurysm of neck; Hypoxia; Diastolic dysfunction; Compression fracture; Constipation; Acute respiratory failure with hypoxia; Hyponatremia; COPD (chronic obstructive pulmonary disease); Compression fracture of thoracic vertebra; T12 burst fracture; Occlusion and stenosis of carotid artery without mention of cerebral infarction; Wrist fracture; Acute on chronic respiratory failure with hypoxia; and Helicobacter pylori gastritis on her problem list.     is allergic to ciprofloxacin hcl; iohexol; prozac; and sulfonamide derivatives.  [name] does not currently have medications on file.  Past Surgical History  Procedure Laterality Date  . 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 Esophagogastroduodenoscopy  11/13/2009    w/dilation to 35mm, gastric ulceration (H.Pylori) s/p treatment  . Esophagogastroduodenoscopy  11/21/2009    distal esophageal web, gastritis  . Lung cancer surgery  12/2010    Left VATS, minithoracotomy, LLL superior segmentectomy  . Vocal cord  surgery  02/06/2011    laryngoscopy with bilateral vocal cord Radiesse injection for vocal cord paralysis  . Abdominal hysterectomy    . Cholecystectomy    . Tubal ligation    . Cataract extraction    . Fatty tumor removal from lt groin    . Cataract extraction w/phaco  09/02/2012    Procedure: CATARACT EXTRACTION PHACO AND INTRAOCULAR LENS PLACEMENT (IOC);   Surgeon: Elta Guadeloupe T. Gershon Crane, MD;  Location: [facility];  Service: Ophthalmology;  Laterality: Left;  CDE:10.35  . Endarterectomy Right 08/21/2013    Procedure: RIGHT CAROTID ANEURYSM RESECTION;  Surgeon: Rosetta Posner, MD;  Location: [facility] OR;  Service: Vascular;  Laterality: Right;  . Fracture surgery Left     hand and left ring finger  . Orif wrist fracture Left 04/09/2014    Procedure: OPEN REDUCTION INTERNAL FIXATION (ORIF) WRIST FRACTURE;  Surgeon: Renette Butters, MD;  Location: [facility];  Service: Orthopedics;  Laterality: Left;  . Percutaneous pinning Left 04/09/2014    Procedure: PERCUTANEOUS PINNING EXTREMITY;  Surgeon: Renette Butters, MD;  Location: [facility];  Service: Orthopedics;  Laterality: Left;  . Esophagogastroduodenoscopy  03/14/12    XFG:HWEXHBZJI in the distal esophagus/Mild gastritis/small HH. + H.pylori, prescribed Pylera. Finished treatment.   . Back surgery  January 13, 2014    Denies any headaches, dizziness, double vision, fevers, chills, night sweats, nausea, vomiting, diarrhea, constipation, chest pain, heart palpitations, shortness of breath, blood in stool, black tarry stool, urinary pain, urinary burning, urinary frequency, hematuria.   PHYSICAL EXAMINATION  ECOG PERFORMANCE STATUS: 1 - Symptomatic but completely ambulatory  Filed Vitals:   11/08/14 1100  BP: 102/65  Pulse: 78  Temp: 97.6 F (36.4 C)  Resp: 18    GENERAL:alert, no distress, well nourished, well developed, comfortable, cooperative and smiling, Diamond in place giving O2. SKIN: skin color, texture, turgor are normal, no rashes or significant lesions HEAD: Normocephalic, No masses, lesions, tenderness or abnormalities EYES: normal, PERRLA, EOMI, Conjunctiva are pink and non-injected EARS: External ears normal OROPHARYNX:lips, buccal mucosa, and tongue normal and mucous membranes are moist  NECK: supple, thyroid normal size, non-tender, without nodularity, no stridor, non-tender, trachea midline LYMPH:  not  examined BREAST:not examined LUNGS: clear to auscultation  HEART: regular rate & rhythm, no murmurs and no gallops ABDOMEN:abdomen soft and normal bowel sounds BACK: Back symmetric, no curvature. EXTREMITIES:less then 2 second capillary refill, no joint deformities, effusion, or inflammation, no cyanosis  NEURO: alert & oriented x 3 with fluent speech, no focal motor/sensory deficits, gait normal with a rolling walker for stability.    LABORATORY DATA: CBC    Component Value Date/Time   WBC 6.3 10/25/2014 1127   RBC 4.58 10/25/2014 1127   HGB 13.2 10/25/2014 1127   HCT 40.9 10/25/2014 1127   PLT 338 10/25/2014 1127   MCV 89.3 10/25/2014 1127   MCH 28.8 10/25/2014 1127   MCHC 32.3 10/25/2014 1127   RDW 14.1 10/25/2014 1127   LYMPHSABS 1.4 10/25/2014 1127   MONOABS 0.5 10/25/2014 1127   EOSABS 0.3 10/25/2014 1127   BASOSABS 0.1 10/25/2014 1127      Chemistry      Component Value Date/Time   NA 135 10/25/2014 1127   K 4.5 10/25/2014 1127   CL 97 10/25/2014 1127   CO2 29 10/25/2014 1127   BUN 7 10/25/2014 1127   CREATININE 0.62 10/25/2014 1127   CREATININE 0.42* 04/10/2014 1130      Component Value Date/Time   CALCIUM  9.7 10/25/2014 1127   ALKPHOS 91 10/25/2014 1127   AST 16 10/25/2014 1127   ALT 11 10/25/2014 1127   BILITOT 0.6 10/25/2014 1127       RADIOGRAPHIC STUDIES:  Ct Chest Wo Contrast  10/28/2014   CLINICAL DATA:  Annual surveillance for non-small cell lung cancer. Surgery 2012. No new chemotherapy. No radiation therapy. Subsequent treatment strategy.  EXAM: CT CHEST WITHOUT CONTRAST  TECHNIQUE: Multidetector CT imaging of the chest was performed following the standard protocol without IV contrast.  COMPARISON:  Chest CT 10/26/2013  FINDINGS: Mediastinum/Nodes: No axillary supraclavicular lymphadenopathy. No mediastinal hilar lymphadenopathy. No pericardial discussed at trace pericardial effusion is unchanged. The distal esophagus is gas-filled and not  changed from prior.  Lungs/Pleura: Extensive centrilobular emphysema. No suspicious pulmonary nodules. There is linear scarring and pleural parenchymal thickening at the left lung base which is similar prior. One focus of nodular pleural parenchymal thickening is increased compared to prior measuring 9 mm (image 46, series 3). Surgical clips in the left infrahilar region.  Upper abdomen: Limited view of the liver, kidneys, pancreas are unremarkable. Normal adrenal glands.  Musculoskeletal: No aggressive osseous lesion. Posterior thoracic and lumbar fusion spanning a severe compression fracture at T12 with no interval change.  IMPRESSION: 1. Increased nodular pleural parenchymal thickening at the left lung base is favored to represent benign pleural reaction. Recommend attention on follow-up. 2. Stable postsurgical change in the left infrahilar lung. 3. Severe centrilobular emphysema. 4. No mediastinal adenopathy.   Electronically Signed   By: Suzy Bouchard M.D.   On: 10/28/2014 12:45      ASSESSMENT AND PLAN:  Adenocarcinoma of lung Stage IB bronchoalveolar carcinoma,. Well-differentiated the left lower lobe status post resection on 12/21/2010. She had 14 lymph nodes negative and no perineural space invasion with clear margins and no LV I.  Thus far she has no evidence for recurrent disease or new disease. Recent CT chest on 10/28/2014 demonstrates NED but with left pleural thickening favored to benign.  Repeat imaging in 6 months and return in 6 months for follow-up.  If re-imaging of chest in 6 months is negative, will return to annual CT imaging per NCCN guidelines and and annual follow-up.   THERAPY PLAN:  Due to increased nodular pleural parenchymal thickening at the left lung base, I will repeat CT imaging in 6 months (compared to one year).  In 6 months, if CT shows NED, will revert back to annual CT imaging per NCCN guidelines.   NCCN guidelines for Non-Small Cell Lung Cancer Surveillance are  as follows:  A. H+P every 6-12 months and CT of chest every 6 months for the first two years  B. Low-dose spiral CT chest annually after first two years of surveillance CTs   C. PET scan not typically indicated for surveillance  D. Smoking cessation advice, counseling, and pharmacotherapy    All questions were answered. The patient knows to call the clinic with any problems, questions or concerns. We can certainly see the patient much sooner if necessary.  Patient and plan discussed with Dr. Ancil Linsey and she is in agreement with the aforementioned.   Wells Mabe 11/08/2014

## 2014-11-08 NOTE — Assessment & Plan Note (Signed)
Stage IB bronchoalveolar carcinoma,. Well-differentiated the left lower lobe status post resection on 12/21/2010. She had 14 lymph nodes negative and no perineural space invasion with clear margins and no LV I.  Thus far she has no evidence for recurrent disease or new disease. Recent CT chest on 10/28/2014 demonstrates NED but with left pleural thickening favored to benign.  Repeat imaging in 6 months and return in 6 months for follow-up.  If re-imaging of chest in 6 months is negative, will return to annual CT imaging per NCCN guidelines and and annual follow-up.

## 2014-11-10 ENCOUNTER — Ambulatory Visit (INDEPENDENT_AMBULATORY_CARE_PROVIDER_SITE_OTHER): Payer: PPO | Admitting: Gastroenterology

## 2014-11-10 ENCOUNTER — Other Ambulatory Visit: Payer: Self-pay

## 2014-11-10 ENCOUNTER — Encounter: Payer: Self-pay | Admitting: Gastroenterology

## 2014-11-10 VITALS — BP 112/61 | HR 76 | Temp 97.4°F | Ht 65.0 in | Wt 138.8 lb

## 2014-11-10 DIAGNOSIS — R194 Change in bowel habit: Secondary | ICD-10-CM

## 2014-11-10 DIAGNOSIS — K2970 Gastritis, unspecified, without bleeding: Secondary | ICD-10-CM

## 2014-11-10 DIAGNOSIS — R1032 Left lower quadrant pain: Secondary | ICD-10-CM

## 2014-11-10 DIAGNOSIS — B9681 Helicobacter pylori [H. pylori] as the cause of diseases classified elsewhere: Secondary | ICD-10-CM

## 2014-11-10 MED ORDER — PREDNISONE 50 MG PO TABS: ORAL_TABLET | ORAL | Status: DC

## 2014-11-10 MED ORDER — PEG-KCL-NACL-NASULF-NA ASC-C 100 G PO SOLR: 1.0000 | ORAL | Status: DC

## 2014-11-10 NOTE — Patient Instructions (Signed)
I have scheduled you for a CT scan of your abdomen.   Instructions for pre-medication prior to CT:  Take Prednisone 50 mg 13 hours before scan Take Prednisone 50 mg 7 hours before scan Take Prednisone 50 mg one hour before scan ALONG WITH Benadryl 50 mg (this is over the counter).   We have scheduled you for a colonoscopy with Dr. Oneida Alar.

## 2014-11-10 NOTE — Progress Notes (Signed)
Referring Provider: [name], MD Primary Care Physician:  [name], MD  Primary GI: Dr. Oneida Alar   Chief Complaint  Patient presents with  . Constipation    HPI:   [name] is a 74 y.o. female presenting today with a history of dysphagia requiring multiple dilatations, constipation. History of H.pylori failing treatment with Prevpac and Pylera. Recent breath test positive, treated with Rifabutin, Amoxicillin, and Protonix for 10 days total. Will need breath test at some point in the future.     Been on oxygen prn since the end of December 2015.   Best thing that works is Environmental consultant. No improvement with Amitiza 24 mcg and Linzess 290 mcg. Entire abdomen is tender, LLQ now radiates across entire lower abdomen. Sometimes upper abdominal spasms. No rectal bleeding. Last colonoscopy in 2011.    Past Medical History  Diagnosis Date  . COPD (chronic obstructive pulmonary disease)   . Anxiety disorder   . Hypothyroidism   . PUD (peptic ulcer disease)     52yr  . Dysphagia   . IBS (irritable bowel syndrome)   . Diverticulosis   . Trigeminal neuralgia   . Diastolic dysfunction   . Allergic rhinitis   . 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 Hiatal hernia   . Aortic aneurysm   . Complication of anesthesia 2011    bloodpressure dropped during colonoscopy, not problems since  . Shortness of breath     with exertion  . Aneurysm of carotid artery     corrected by surgery 08/21/13  . Compression fracture   . On home O2 12/15/2013    chronic hypoxia  . Depression   . Emphysema lung 11/08/2014    Past Surgical History  Procedure Laterality Date  . 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 Esophagogastroduodenoscopy  11/13/2009    w/dilation to 116m gastric ulceration (H.Pylori) s/p treatment  . Esophagogastroduodenoscopy  11/21/2009    distal esophageal web, gastritis  . Lung cancer  surgery  12/2010    Left VATS, minithoracotomy, LLL superior segmentectomy  . Vocal cord surgery  02/06/2011    laryngoscopy with bilateral vocal cord Radiesse injection for vocal cord paralysis  . Abdominal hysterectomy    . Cholecystectomy    . Tubal ligation    . Cataract extraction    . Fatty tumor removal from lt groin    . Cataract extraction w/phaco  09/02/2012    Procedure: CATARACT EXTRACTION PHACO AND INTRAOCULAR LENS PLACEMENT (IOC);  Surgeon: MaElta Guadeloupe. ShGershon CraneMD;  Location: [facility];  Service: Ophthalmology;  Laterality: Left;  CDE:10.35  . Endarterectomy Right 08/21/2013    Procedure: RIGHT CAROTID ANEURYSM RESECTION;  Surgeon: ToRosetta PosnerMD;  [facility];  Service: Vascular;  Laterality: Right;  . Fracture surgery Left     hand and left ring finger  . Orif wrist fracture Left 04/09/2014    Procedure: OPEN REDUCTION INTERNAL FIXATION (ORIF) WRIST FRACTURE;  Surgeon: TiRenette ButtersMD;  [facility] Service: [facility];  Laterality: Left;  . Percutaneous pinning Left 04/09/2014    Procedure: PERCUTANEOUS PINNING EXTREMITY;  Surgeon: TiRenette ButtersMD;  [facility] Service: [facility];  Laterality: Left;  . Esophagogastroduodenoscopy  03/14/12    SLRSW:NIOEVOJJKn the distal esophagus/Mild gastritis/small HH. + H.pylori, prescribed Pylera. Finished treatment.   . Back surgery  January 13, 2014  Current Outpatient Prescriptions  Medication Sig Dispense Refill  . alendronate (FOSAMAX) 70 MG tablet Take 1 tablet (70 mg total) by mouth once a week. Take with a full glass of water on an empty stomach. Take on sundays 12 tablet 4  . calcium citrate-vitamin D (CITRACAL+D) 315-200 MG-UNIT per tablet Take 1 tablet by mouth daily.     . Cholecalciferol (VITAMIN D-3) 5000 UNITS TABS Take 5,000 Units by mouth daily.     . citalopram (CELEXA) 40 MG tablet Take 1 tablet (40 mg total) by mouth at bedtime. 90 tablet 4  . cyclobenzaprine (FLEXERIL) 10 MG tablet Take 1 tablet (10  mg total) by mouth every 8 (eight) hours as needed for muscle spasms. (Patient taking differently: Take 10 mg by mouth at bedtime. ) 30 tablet 4  . fish oil-omega-3 fatty acids 1000 MG capsule Take 1 g by mouth daily.     Marland Kitchen ibuprofen (ADVIL,MOTRIN) 200 MG tablet Take 400 mg by mouth every 6 (six) hours as needed (for pain).    Marland Kitchen levothyroxine (SYNTHROID, LEVOTHROID) 25 MCG tablet Take 1 tablet (25 mcg total) by mouth daily before breakfast. 90 tablet 3  . Multiple Vitamin (MULTIVITAMIN WITH MINERALS) TABS Take 1 tablet by mouth daily.    . Oxcarbazepine (TRILEPTAL) 300 MG tablet Take 300 mg by mouth 2 (two) times daily.    . peg 3350 powder (MOVIPREP) 100 G SOLR Take 1 kit (200 g total) by mouth as directed. 1 kit 0  . predniSONE (DELTASONE) 50 MG tablet Take 1 tablet 13 hours before scan, 7 hours before scan, and 1 hour before scan. 3 tablet 0   No current facility-administered medications for this visit.    Allergies as of 11/10/2014 - Review Complete 11/10/2014  Allergen Reaction Noted  . Ciprofloxacin hcl Hives 07/23/2011  . Iohexol  10/31/2010  . Prozac [fluoxetine hcl] Rash 05/11/2011  . Sulfonamide derivatives Hives and Rash     Family History  Problem Relation Age of Onset  . Liver cancer Sister   . Cancer Sister   . Pulmonary embolism Mother   . Heart attack Father   . Hypertension Father   . Cancer Brother   . Cancer Daughter   . Colon cancer Neg Hx     History   Social History  . Marital Status: Divorced    Spouse Name: N/A    Number of Children: N/A  . Years of Education: N/A   Social History Main Topics  . Smoking status: Former Smoker -- 0.50 packs/day for 20 years    Types: Cigarettes    Quit date: 12/21/2010  . Smokeless tobacco: Never Used     Comment: smoking cessation info given and reviewed   . Alcohol Use: Yes     Comment: seldom  . Drug Use: No  . Sexual Activity: Yes    Birth Control/ Protection: Surgical   Other Topics Concern  . None    Social History Narrative    Review of Systems: Gen: Denies fever, chills, anorexia. Denies fatigue, weakness, weight loss.  CV: occasional palpitations Resp: occasional DOE  GI: see HPI Derm: Denies rash, itching, dry skin Psych: Denies depression, anxiety, memory loss, confusion. No homicidal or suicidal ideation.  Heme: Denies bruising, bleeding, and enlarged lymph nodes.  Physical Exam: BP 112/61 mmHg  Pulse 76  Temp(Src) 97.4 F (36.3 C)  Ht 5' 5"  (1.651 m)  Wt 138 lb 12.8 oz (62.959 kg)  BMI 23.10 kg/m2 General:   Alert  and oriented. No distress noted. Pleasant and cooperative.  Head:  Normocephalic and atraumatic. Eyes:  Conjuctiva clear without scleral icterus. Mouth:  Oral mucosa pink and moist.  Heart:  S1, S2 present without murmurs, rubs, or gallops.  Abdomen:  +BS, soft, mild TTP LLQ and non-distended. No rebound or guarding. No HSM or masses noted. Msk:  Symmetrical without gross deformities. Normal posture. Extremities:  Without edema. Neurologic:  Alert and  oriented x4;  grossly normal neurologically. Skin:  Intact without significant lesions or rashes. Psych:  Alert and cooperative. Normal mood and affect.

## 2014-11-11 ENCOUNTER — Telehealth: Payer: Self-pay | Admitting: *Deleted

## 2014-11-11 ENCOUNTER — Telehealth: Payer: Self-pay

## 2014-11-11 NOTE — Telephone Encounter (Signed)
Called pt to notify her of her CT appt. She is scheduled for 11/19/2014. PA # [id number] Valid 11/19/2014-05/20/2015.   Pt know to pick up oral contrast

## 2014-11-11 NOTE — Telephone Encounter (Signed)
Received fax from [facility] care mgmt with authorization for pt to have a CT scan that was placed by Dr. Barney Drain, MD Gastroenterologist with authorization number [id number]   DX: R19.4-change in bowel habit        R10.32- Left lower quadrant pain  Number of visits: 1  Procedure: 74177=CT Abd&Pelv w/Contrast  Start Date: 11/19/14- expires on 05/20/15  Treating provider: Barney Drain, MD  Place of service: [facility]  Dr. Oneida Alar and [name] also received [facility] authorization

## 2014-11-12 ENCOUNTER — Encounter: Payer: Self-pay | Admitting: Gastroenterology

## 2014-11-14 NOTE — Assessment & Plan Note (Signed)
74 year old female with persistent, worsening constipation despite more aggressive bowel regimen to include trial of Amitiza and Linzess. Abdominal discomfort worsening but without rectal bleeding. Last colonoscopy in 2011. Doubt occult malignancy, but as symptoms are worsening/persisting, will proceed with early interval colonoscopy.   Proceed with colonoscopy with Dr. Oneida Alar in the near future. The risks, benefits, and alternatives have been discussed in detail with the patient. They state understanding and desire to proceed.

## 2014-11-14 NOTE — Assessment & Plan Note (Signed)
LLQ pain worsening, no acute physical findings on exam. CT now. Premedicate with Prednisone and Benadryl due to dye allergy.

## 2014-11-14 NOTE — Assessment & Plan Note (Signed)
Treatment failure with Prevpac and Pylera. Recently completed third round of treatment. Will need breath test at some point in the future.

## 2014-11-15 ENCOUNTER — Encounter: Payer: Self-pay | Admitting: Gastroenterology

## 2014-11-15 ENCOUNTER — Other Ambulatory Visit: Payer: Self-pay

## 2014-11-15 MED ORDER — DIPHENHYDRAMINE HCL 50 MG PO TABS: 50.0000 mg | ORAL_TABLET | Freq: Every evening | ORAL | Status: DC | PRN

## 2014-11-15 MED ORDER — DIPHENHYDRAMINE HCL 50 MG PO TABS: 50.0000 mg | ORAL_TABLET | Freq: Once | ORAL | Status: DC

## 2014-11-16 NOTE — Progress Notes (Signed)
cc'ed to pcp °

## 2014-11-17 ENCOUNTER — Ambulatory Visit (HOSPITAL_COMMUNITY): Payer: PPO

## 2014-11-19 ENCOUNTER — Ambulatory Visit (HOSPITAL_COMMUNITY)
Admission: RE | Admit: 2014-11-19 | Discharge: 2014-11-19 | Disposition: A | Discharge status: Home / Self Care | Payer: PPO | Source: Ambulatory Visit | Attending: Gastroenterology | Admitting: Gastroenterology

## 2014-11-19 ENCOUNTER — Telehealth: Payer: Self-pay | Admitting: Family Medicine

## 2014-11-19 DIAGNOSIS — K449 Diaphragmatic hernia without obstruction or gangrene: Secondary | ICD-10-CM | POA: Insufficient documentation

## 2014-11-19 DIAGNOSIS — R194 Change in bowel habit: Secondary | ICD-10-CM

## 2014-11-19 DIAGNOSIS — N200 Calculus of kidney: Secondary | ICD-10-CM | POA: Insufficient documentation

## 2014-11-19 DIAGNOSIS — R10819 Abdominal tenderness, unspecified site: Secondary | ICD-10-CM | POA: Diagnosis present

## 2014-11-19 MED ORDER — IOHEXOL 300 MG/ML  SOLN
100.0000 mL | Freq: Once | INTRAMUSCULAR | Status: AC | PRN
  Administered 2014-11-19: 100 mL via INTRAVENOUS

## 2014-11-19 NOTE — Telephone Encounter (Signed)
[phone] [facility] sent patient to get ct of her stomach today, she would like for dr pickard to check to see if results were in and she needs to be concerned about anything

## 2014-11-21 ENCOUNTER — Encounter: Payer: Self-pay | Admitting: Gastroenterology

## 2014-11-23 ENCOUNTER — Encounter (HOSPITAL_COMMUNITY)
Admission: RE | Disposition: A | Discharge status: Home / Self Care | Payer: Self-pay | Source: Ambulatory Visit | Attending: Gastroenterology

## 2014-11-23 ENCOUNTER — Encounter: Payer: Self-pay | Admitting: Family Medicine

## 2014-11-23 ENCOUNTER — Ambulatory Visit (HOSPITAL_COMMUNITY)
Admission: RE | Admit: 2014-11-23 | Discharge: 2014-11-23 | Disposition: A | Discharge status: Home / Self Care | Payer: PPO | Source: Ambulatory Visit | Attending: Gastroenterology | Admitting: Gastroenterology

## 2014-11-23 ENCOUNTER — Encounter (HOSPITAL_COMMUNITY): Payer: Self-pay | Admitting: *Deleted

## 2014-11-23 DIAGNOSIS — Z9981 Dependence on supplemental oxygen: Secondary | ICD-10-CM | POA: Diagnosis not present

## 2014-11-23 DIAGNOSIS — F329 Major depressive disorder, single episode, unspecified: Secondary | ICD-10-CM | POA: Insufficient documentation

## 2014-11-23 DIAGNOSIS — K3189 Other diseases of stomach and duodenum: Secondary | ICD-10-CM | POA: Insufficient documentation

## 2014-11-23 DIAGNOSIS — E039 Hypothyroidism, unspecified: Secondary | ICD-10-CM | POA: Insufficient documentation

## 2014-11-23 DIAGNOSIS — Z888 Allergy status to other drugs, medicaments and biological substances status: Secondary | ICD-10-CM | POA: Insufficient documentation

## 2014-11-23 DIAGNOSIS — R194 Change in bowel habit: Secondary | ICD-10-CM | POA: Diagnosis present

## 2014-11-23 DIAGNOSIS — E785 Hyperlipidemia, unspecified: Secondary | ICD-10-CM | POA: Insufficient documentation

## 2014-11-23 DIAGNOSIS — K5730 Diverticulosis of large intestine without perforation or abscess without bleeding: Secondary | ICD-10-CM | POA: Insufficient documentation

## 2014-11-23 DIAGNOSIS — Z882 Allergy status to sulfonamides status: Secondary | ICD-10-CM | POA: Insufficient documentation

## 2014-11-23 DIAGNOSIS — F17210 Nicotine dependence, cigarettes, uncomplicated: Secondary | ICD-10-CM | POA: Insufficient documentation

## 2014-11-23 DIAGNOSIS — K648 Other hemorrhoids: Secondary | ICD-10-CM | POA: Diagnosis not present

## 2014-11-23 DIAGNOSIS — J449 Chronic obstructive pulmonary disease, unspecified: Secondary | ICD-10-CM | POA: Insufficient documentation

## 2014-11-23 DIAGNOSIS — Z85118 Personal history of other malignant neoplasm of bronchus and lung: Secondary | ICD-10-CM | POA: Insufficient documentation

## 2014-11-23 HISTORY — PX: COLONOSCOPY: SHX5424

## 2014-11-23 SURGERY — COLONOSCOPY
Anesthesia: Moderate Sedation

## 2014-11-23 MED ORDER — MIDAZOLAM HCL 5 MG/5ML IJ SOLN
INTRAMUSCULAR | Status: DC
Start: 2014-11-23 — End: 2014-11-23
  Filled 2014-11-23: qty 10

## 2014-11-23 MED ORDER — MEPERIDINE HCL 100 MG/ML IJ SOLN
INTRAMUSCULAR | Status: DC | PRN
  Administered 2014-11-23: 25 mg via INTRAVENOUS

## 2014-11-23 MED ORDER — MEPERIDINE HCL 100 MG/ML IJ SOLN
INTRAMUSCULAR | Status: AC
  Filled 2014-11-23: qty 2

## 2014-11-23 MED ORDER — MIDAZOLAM HCL 5 MG/5ML IJ SOLN
INTRAMUSCULAR | Status: DC | PRN
  Administered 2014-11-23: 2 mg via INTRAVENOUS
  Administered 2014-11-23: 1 mg via INTRAVENOUS

## 2014-11-23 MED ORDER — SODIUM CHLORIDE 0.9 % IV SOLN
INTRAVENOUS | Status: DC
  Administered 2014-11-23: 08:00:00 via INTRAVENOUS

## 2014-11-23 MED ORDER — STERILE WATER FOR IRRIGATION IR SOLN
Status: DC | PRN
  Administered 2014-11-23: 09:00:00

## 2014-11-23 NOTE — Op Note (Addendum)
[facility] [address] [place], [address]   COLONOSCOPY PROCEDURE REPORT  PATIENT: [name], [name]  MR#: [id number] BIRTHDATE: 01/20/1941 , 72  yrs. old GENDER: female ENDOSCOPIST: Danie Binder, MD REFERRED [id number] Dennard Schaumann, M.D. PROCEDURE DATE:  Dec 04, 2014 PROCEDURE:   Colonoscopy, diagnostic INDICATIONS:change in bowel habits. MEDICATIONS: Demerol 25 mg IV and Versed 3 mg IV  DESCRIPTION OF PROCEDURE:    Physical exam was performed.  Informed consent was obtained from the patient after explaining the benefits, risks, and alternatives to procedure.  The patient was connected to monitor and placed in left lateral position. Continuous oxygen was provided by nasal cannula and IV medicine administered through an indwelling cannula.  After administration of sedation and rectal exam, the patients rectum was intubated and the EC-[id number] ([id number])  colonoscope was advanced under direct visualization to the ileum.  The scope was removed slowly by carefully examining the color, texture, anatomy, and integrity mucosa on the way out.  The patient was recovered in endoscopy and discharged home in satisfactory condition.    COLON FINDINGS: The colon was redundant.  , There was moderate diverticulosis noted in the sigmoid colon with associated muscular hypertrophy and tortuosity.  , and Moderate sized internal hemorrhoids were found.  PREP QUALITY: good.  CECAL W/D TIME: 18       minutes COMPLICATIONS: None  ENDOSCOPIC IMPRESSION: 1.   The LEFT colon IS redundant. CHANGE IN BOWEL HABITS MOTS LIKELY DUE TO FUNCTIONAL CONSTIPATION. 2.   Moderate diverticulosis in the sigmoid colon 3.   Moderate sized internal hemorrhoids  RECOMMENDATIONS: INSTRUCTIONS GIVEN TO MANAGE CONSTIPATION. USE [employer] STOOL SOFTENER TO HAVE A BM 2-3 TIMES A WEEK. DRINK WATER TO KEEP URINE LIGHT YELLOW. FOLLOW A HIGH FIBER DIET. FOLLOW UP IN 6 MOS.  Next colonoscopy in 10-15 years IF THE  BENEFITS OUTWEIGH THE RISKS.   _______________________________ Lorrin MaisDanie Binder, MD Dec 04, 2014 3:04 PM   CPT CODES: ICD CODES:  The ICD and CPT codes recommended by this software are interpretations from the data that the clinical staff has captured with the software.  The verification of the translation of this report to the ICD and CPT codes and modifiers is the sole responsibility of the health care institution and practicing physician where this report was generated.  [employer]. will not be held responsible for the validity of the ICD and CPT codes included on this report.  AMA assumes no liability for data contained or not contained herein. CPT is a Designer, television/film set of the [employer].

## 2014-11-23 NOTE — H&P (Signed)
Primary Care Physician:  [name], MD Primary Gastroenterologist:  Dr. Oneida Alar  Pre-Procedure History & Physical: HPI:  KELSE PLOCH is a 74 y.o. female here for Change in bowel habits.  Past Medical History  Diagnosis Date  . COPD (chronic obstructive pulmonary disease)   . Anxiety disorder   . Hypothyroidism   . PUD (peptic ulcer disease)     52yr  . Dysphagia   . IBS (irritable bowel syndrome)   . Diverticulosis   . Trigeminal neuralgia   . Diastolic dysfunction   . Allergic rhinitis   . 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 Hiatal hernia   . Aortic aneurysm   . Complication of anesthesia 2011    bloodpressure dropped during colonoscopy, not problems since  . Shortness of breath     with exertion  . Aneurysm of carotid artery     corrected by surgery 08/21/13  . Compression fracture   . On home O2 12/15/2013    chronic hypoxia  . Depression   . Emphysema lung 11/08/2014    Past Surgical History  Procedure Laterality Date  . 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 Esophagogastroduodenoscopy  11/13/2009    w/dilation to 126m gastric ulceration (H.Pylori) s/p treatment  . Esophagogastroduodenoscopy  11/21/2009    distal esophageal web, gastritis  . Lung cancer surgery  12/2010    Left VATS, minithoracotomy, LLL superior segmentectomy  . Vocal cord surgery  02/06/2011    laryngoscopy with bilateral vocal cord Radiesse injection for vocal cord paralysis  . Abdominal hysterectomy    . Cholecystectomy    . Tubal ligation    . Cataract extraction    . Fatty tumor removal from lt groin    . Cataract extraction w/phaco  09/02/2012    Procedure: CATARACT EXTRACTION PHACO AND INTRAOCULAR LENS PLACEMENT (IOC);  Surgeon: MaElta Guadeloupe. ShGershon CraneMD;  Location: [facility];  Service: Ophthalmology;  Laterality: Left;  CDE:10.35  . Endarterectomy Right 08/21/2013    Procedure: RIGHT CAROTID ANEURYSM RESECTION;  Surgeon: ToRosetta PosnerMD;  [facility];  Service: Vascular;  Laterality: Right;  . Fracture surgery Left     hand and left ring finger  . Orif wrist fracture Left 04/09/2014    Procedure: OPEN REDUCTION INTERNAL FIXATION (ORIF) WRIST FRACTURE;  Surgeon: TiRenette ButtersMD;  [facility];  Laterality: Left;  . Percutaneous pinning Left 04/09/2014    Procedure: PERCUTANEOUS PINNING EXTREMITY;  Surgeon: TiRenette ButtersMD;  [facility] [facility];  Laterality: Left;  . Esophagogastroduodenoscopy  03/14/12    SLJME:[id number] the distal esophagus/Mild gastritis/small HH. + H.pylori, prescribed Pylera. Finished treatment.   . Back surgery  January 13, 2014    Prior to Admission medications   Medication Sig Start Date End Date Taking? Authorizing Provider  alendronate (FOSAMAX) 70 MG tablet Take 1 tablet (70 mg total) by mouth once a week. Take with a full glass of water on an empty stomach. Take on sundays 12/02/13  Yes WaSusy FrizzleMD  calcium citrate-vitamin D (CITRACAL+D) 315-200 MG-UNIT per tablet Take 1 tablet by mouth daily.    Yes Historical Provider, MD  Cholecalciferol (VITAMIN D-3) 5000 UNITS TABS Take 5,000 Units by mouth daily.    Yes Historical Provider, MD  citalopram (CELEXA) 40 MG tablet Take 1 tablet (40 mg total) by mouth at bedtime. 12/02/13  Yes  [name], MD  cyclobenzaprine (FLEXERIL) 10 MG tablet Take 1 tablet (10 mg total) by mouth every 8 (eight) hours as needed for muscle spasms. Patient taking differently: Take 10 mg by mouth at bedtime.  08/05/14  Yes [name], MD  diphenhydrAMINE (BENADRYL) 50 MG tablet Take 1 tablet (50 mg total) by mouth once. Take 1 hour prior to CT 11/15/14  Yes Orvil Feil, NP  diphenhydrAMINE (BENADRYL) 50 MG tablet Take 1 tablet (50 mg total) by mouth at bedtime as needed for itching. Take 1 tablet 1 hour prior to study 11/15/14  Yes Danie Binder, MD  fish oil-omega-3 fatty acids 1000 MG capsule Take 1 g  by mouth daily.    Yes Historical Provider, MD  ibuprofen (ADVIL,MOTRIN) 200 MG tablet Take 400 mg by mouth every 6 (six) hours as needed (for pain).   Yes Historical Provider, MD  levothyroxine (SYNTHROID, LEVOTHROID) 25 MCG tablet Take 1 tablet (25 mcg total) by mouth daily before breakfast. 12/02/13  Yes [name], MD  Multiple Vitamin (MULTIVITAMIN WITH MINERALS) TABS Take 1 tablet by mouth daily.   Yes Historical Provider, MD  Oxcarbazepine (TRILEPTAL) 300 MG tablet Take 300 mg by mouth 2 (two) times daily. 08/11/14  Yes Historical Provider, MD  peg 3350 powder (MOVIPREP) 100 G SOLR Take 1 kit (200 g total) by mouth as directed. 11/10/14  Yes Danie Binder, MD  predniSONE (DELTASONE) 50 MG tablet Take 1 tablet 13 hours before scan, 7 hours before scan, and 1 hour before scan. 11/10/14  Yes Orvil Feil, NP    Allergies as of 11/10/2014 - Review Complete 11/10/2014  Allergen Reaction Noted  . Ciprofloxacin hcl Hives 07/23/2011  . Iohexol  10/31/2010  . Prozac [fluoxetine hcl] Rash 05/11/2011  . Sulfonamide derivatives Hives and Rash     Family History  Problem Relation Age of Onset  . Liver cancer Sister   . Cancer Sister   . Pulmonary embolism Mother   . Heart attack Father   . Hypertension Father   . Cancer Brother   . Cancer Daughter   . Colon cancer Neg Hx     History   Social History  . Marital Status: Divorced    Spouse Name: N/A  . Number of Children: N/A  . Years of Education: N/A   Occupational History  . Not on file.   Social History Main Topics  . Smoking status: Former Smoker -- 0.50 packs/day for 20 years    Types: Cigarettes    Quit date: 12/21/2010  . Smokeless tobacco: Never Used     Comment: smoking cessation info given and reviewed   . Alcohol Use: Yes     Comment: seldom  . Drug Use: No  . Sexual Activity: Yes    Birth Control/ Protection: Surgical   Other Topics Concern  . Not on file   Social History Narrative    Review of  Systems: See HPI, otherwise negative ROS   Physical Exam: BP 111/74 mmHg  Pulse 73  Temp(Src) 97.9 F (36.6 C) (Oral)  Resp 18  SpO2 94% General:   Alert,  pleasant and cooperative in NAD Head:  Normocephalic and atraumatic. Neck:  Supple; Lungs:  Clear throughout to auscultation.    Heart:  Regular rate and rhythm. Abdomen:  Soft, nontender and nondistended. Normal bowel sounds, without guarding, and without rebound.   Neurologic:  Alert and  oriented x4;  grossly normal neurologically.  Impression/Plan:  Change in bowel habits  PLAN:  1. TCS TODAY

## 2014-11-23 NOTE — Progress Notes (Addendum)
REVIEWED-RESULTS OF CT DISCUSSED IN ENDO.

## 2014-11-23 NOTE — Telephone Encounter (Signed)
No dangerous findings.  Does show large stool burden and possible constipation and some coincidental findings of a small aneurysm and hernia

## 2014-11-23 NOTE — Discharge Instructions (Signed)
You DID NOT HAVE ANY POLYPS. YOU HAVE DIVERTICULOSIS IN YOUR LEFT. You have LARGE EXTERNAL hemorrhoids.   SEE INSTRUCTIONS BELOW TO MANAGE YOUR CONSTIPATION.  TAKE [employer] STOOL SOFTENER TO HAVE A BM 2-3 TIMES A WEEK.  DRINK WATER TO KEEP URINE LIGHT YELLOW.  FOLLOW A HIGH FIBER DIET. SEE INFO BELOW.  FOLLOW UP IN 6 MOS.  Next colonoscopy in 10-15 years IF THE BENEFITS OUTWEIGH THE RISKS.   Colonoscopy Care After Read the instructions outlined below and refer to this sheet in the next week. These discharge instructions provide you with general information on caring for yourself after you leave the hospital. While your treatment has been planned according to the most current medical practices available, unavoidable complications occasionally occur. If you have any problems or questions after discharge, call DR. Lizbet Cirrincione, [phone].  ACTIVITY  You may resume your regular activity, but move at a slower pace for the next 24 hours.   Take frequent rest periods for the next 24 hours.   Walking will help get rid of the air and reduce the bloated feeling in your belly (abdomen).   No driving for 24 hours (because of the medicine (anesthesia) used during the test).   You may shower.   Do not sign any important legal documents or operate any machinery for 24 hours (because of the anesthesia used during the test).    NUTRITION  Drink plenty of fluids.   You may resume your normal diet as instructed by your doctor.   Begin with a light meal and progress to your normal diet. Heavy or fried foods are harder to digest and may make you feel sick to your stomach (nauseated).   Avoid alcoholic beverages for 24 hours or as instructed.    MEDICATIONS  You may resume your normal medications.   WHAT YOU CAN EXPECT TODAY  Some feelings of bloating in the abdomen.   Passage of more gas than usual.   Spotting of blood in your stool or on the toilet paper  .  IF YOU HAD POLYPS  REMOVED DURING THE COLONOSCOPY:  Eat a soft diet IF YOU HAVE NAUSEA, BLOATING, ABDOMINAL PAIN, OR VOMITING.    FINDING OUT THE RESULTS OF YOUR TEST Not all test results are available during your visit. DR. Oneida Alar WILL CALL YOU WITHIN 7 DAYS OF YOUR PROCEDUE WITH YOUR RESULTS. Do not assume everything is normal if you have not heard from DR. Cristo Ausburn IN ONE WEEK, CALL HER OFFICE AT [phone].  SEEK IMMEDIATE MEDICAL ATTENTION AND CALL THE OFFICE: [phone] IF:  You have more than a spotting of blood in your stool.   Your belly is swollen (abdominal distention).   You are nauseated or vomiting.   You have a temperature over 101F.   You have abdominal pain or discomfort that is severe or gets worse throughout the day.   Constipation in Adults Constipation is having fewer than 2 bowel movements per week. Usually, the stools are hard. As we grow older, constipation is more common. If you try to fix constipation with laxatives, the problem may get worse. This is because laxatives taken over a long period of time make the colon muscles weaker. A low-fiber diet, not taking in enough fluids, and taking some medicines may make these problems worse.  HOME CARE INSTRUCTIONS  Constipation is usually best cared for without medicines. Increasing dietary fiber and eating more fruits and vegetables is the best way to manage constipation.   Slowly increase fiber intake  to 25 to 38 grams per day. Whole grains, fruits, vegetables, and legumes are good sources of fiber. A dietitian can further help you incorporate high-fiber foods into your diet.   Drink enough water and fluids to keep your urine clear or pale yellow.   A fiber supplement may be added to your diet if you cannot get enough fiber from foods.   Increasing your activities also helps improve regularity.   Stronger measures, such as magnesium sulfate, should be avoided if possible. This may cause uncontrollable diarrhea. Using magnesium  sulfate may not allow you time to make it to the bathroom.    High-Fiber Diet A high-fiber diet changes your normal diet to include more whole grains, legumes, fruits, and vegetables. Changes in the diet involve replacing refined carbohydrates with unrefined foods. The calorie level of the diet is essentially unchanged. The Dietary Reference Intake (recommended amount) for adult males is 38 grams per day. For adult females, it is 25 grams per day. Pregnant and lactating women should consume 28 grams of fiber per day. Fiber is the intact part of a plant that is not broken down during digestion. Functional fiber is fiber that has been isolated from the plant to provide a beneficial effect in the body. PURPOSE  Increase stool bulk.   Ease and regulate bowel movements.   Lower cholesterol.  INDICATIONS THAT YOU NEED MORE FIBER  Constipation and hemorrhoids.   Uncomplicated diverticulosis (intestine condition) and irritable bowel syndrome.   Weight management.   As a protective measure against hardening of the arteries (atherosclerosis), diabetes, and cancer.   GUIDELINES FOR INCREASING FIBER IN THE DIET  Start adding fiber to the diet slowly. A gradual increase of about 5 more grams (2 slices of whole-wheat bread, 2 servings of most fruits or vegetables, or 1 bowl of high-fiber cereal) per day is best. Too rapid an increase in fiber may result in constipation, flatulence, and bloating.   Drink enough water and fluids to keep your urine clear or pale yellow. Water, juice, or caffeine-free drinks are recommended. Not drinking enough fluid may cause constipation.   Eat a variety of high-fiber foods rather than one type of fiber.   Try to increase your intake of fiber through using high-fiber foods rather than fiber pills or supplements that contain small amounts of fiber.   The goal is to change the types of food eaten. Do not supplement your present diet with high-fiber foods, but replace  foods in your present diet.  INCLUDE A VARIETY OF FIBER SOURCES  Replace refined and processed grains with whole grains, canned fruits with fresh fruits, and incorporate other fiber sources. White rice, white breads, and most bakery goods contain little or no fiber.   Brown whole-grain rice, buckwheat oats, and many fruits and vegetables are all good sources of fiber. These include: broccoli, Brussels sprouts, cabbage, cauliflower, beets, sweet potatoes, white potatoes (skin on), carrots, tomatoes, eggplant, squash, berries, fresh fruits, and dried fruits.   Cereals appear to be the richest source of fiber. Cereal fiber is found in whole grains and bran. Bran is the fiber-rich outer coat of cereal grain, which is largely removed in refining. In whole-grain cereals, the bran remains. In breakfast cereals, the largest amount of fiber is found in those with "bran" in their names. The fiber content is sometimes indicated on the label.   You may need to include additional fruits and vegetables each day.   In baking, for 1 cup white flour, you  may use the following substitutions:   1 cup whole-wheat flour minus 2 tablespoons.   1/2 cup white flour plus 1/2 cup whole-wheat flour.   Diverticulosis Diverticulosis is a common condition that develops when small pouches (diverticula) form in the wall of the colon. The risk of diverticulosis increases with age. It happens more often in people who eat a low-fiber diet. Most individuals with diverticulosis have no symptoms. Those individuals with symptoms usually experience belly (abdominal) pain, constipation, or loose stools (diarrhea).  HOME CARE INSTRUCTIONS  Increase the amount of fiber in your diet as directed by your caregiver or dietician. This may reduce symptoms of diverticulosis.   Drink at least 6 to 8 glasses of water each day to prevent constipation.   Try not to strain when you have a bowel movement.   THERE IS NO NEED TO Avoid nuts and  seeds to prevent complications.   FOODS HAVING HIGH FIBER CONTENT INCLUDE:  Fruits. Apple, peach, pear, tangerine, raisins, prunes.   Vegetables. Brussels sprouts, asparagus, broccoli, cabbage, carrot, cauliflower, romaine lettuce, spinach, summer squash, tomato, winter squash, zucchini.   Starchy Vegetables. Baked beans, kidney beans, lima beans, split peas, lentils, potatoes (with skin).   Grains. Whole wheat bread, brown rice, bran flake cereal, plain oatmeal, white rice, shredded wheat, bran muffins.    Hemorrhoids Hemorrhoids are dilated (enlarged) veins around the rectum. Sometimes clots will form in the veins. This makes them swollen and painful. These are called thrombosed hemorrhoids. Causes of hemorrhoids include:  Constipation.   Straining to have a bowel movement.   HEAVY LIFTING HOME CARE INSTRUCTIONS  Eat a well balanced diet and drink 6 to 8 glasses of water every day to avoid constipation. You may also use a bulk laxative.   Avoid straining to have bowel movements.   Keep anal area dry and clean.   Do not use a donut shaped pillow or sit on the toilet for long periods. This increases blood pooling and pain.   Move your bowels when your body has the urge; this will require less straining and will decrease pain and pressure.

## 2014-11-23 NOTE — Telephone Encounter (Signed)
Pt had colonoscopy this morning. Will send message through mychart to make sure they went over her results.

## 2014-11-24 ENCOUNTER — Encounter: Payer: Self-pay | Admitting: Family Medicine

## 2014-11-24 ENCOUNTER — Encounter (HOSPITAL_COMMUNITY): Payer: Self-pay | Admitting: Gastroenterology

## 2014-11-25 ENCOUNTER — Encounter: Payer: Self-pay | Admitting: Family Medicine

## 2014-12-01 ENCOUNTER — Encounter: Payer: Self-pay | Admitting: Family Medicine

## 2014-12-01 ENCOUNTER — Telehealth: Payer: Self-pay

## 2014-12-01 NOTE — Telephone Encounter (Signed)
Pt is aware.  

## 2014-12-01 NOTE — Telephone Encounter (Signed)
PLEASE CALL PT. 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 It is not likely she will benefit from the supplement.

## 2014-12-01 NOTE — Telephone Encounter (Signed)
Pt called and asked if the info on Leaky gut from Dr. Irena Cords was legitimate. She said it reminds her of her symptoms of constipation. Sore belly, and a lot of gas. He recommends some supplement and she would like to know Dr. Oneida Alar opinion on it. Please advise!

## 2014-12-01 NOTE — Telephone Encounter (Signed)
LMOM to call.

## 2014-12-03 ENCOUNTER — Encounter: Payer: Self-pay | Admitting: Family Medicine

## 2014-12-04 ENCOUNTER — Encounter: Payer: Self-pay | Admitting: Family Medicine

## 2014-12-05 ENCOUNTER — Encounter: Payer: Self-pay | Admitting: Family Medicine

## 2014-12-06 ENCOUNTER — Encounter: Payer: Self-pay | Admitting: Family Medicine

## 2014-12-06 MED ORDER — CILIDINIUM-CHLORDIAZEPOXIDE 2.5-5 MG PO CAPS: 1.0000 | ORAL_CAPSULE | Freq: Three times a day (TID) | ORAL | Status: DC

## 2014-12-06 MED ORDER — LEVOTHYROXINE SODIUM 25 MCG PO TABS: 25.0000 ug | ORAL_TABLET | Freq: Every day | ORAL | Status: DC

## 2014-12-06 MED ORDER — CITALOPRAM HYDROBROMIDE 40 MG PO TABS: 40.0000 mg | ORAL_TABLET | Freq: Every day | ORAL | Status: DC

## 2014-12-06 NOTE — Telephone Encounter (Signed)
Levothyroxine and Celexa sent to [facility]

## 2014-12-07 ENCOUNTER — Encounter: Payer: Self-pay | Admitting: Family Medicine

## 2014-12-08 ENCOUNTER — Encounter: Payer: Self-pay | Admitting: Family Medicine

## 2014-12-08 ENCOUNTER — Telehealth: Payer: Self-pay | Admitting: Family Medicine

## 2014-12-08 NOTE — Telephone Encounter (Signed)
Pt says pharmacy does not have the Librax.  I called pharmacy, they did have. BUT under Medicare Part D plans it falls under a category of medications that are just not covered in any way.  Her cost is almost $100.  She does not have that. Do you have nay other recommendations?

## 2014-12-09 MED ORDER — HYOSCYAMINE SULFATE 0.125 MG PO TABS: 0.1250 mg | ORAL_TABLET | Freq: Four times a day (QID) | ORAL | Status: DC | PRN

## 2014-12-09 NOTE — Telephone Encounter (Signed)
Try levsin 0.125 mg po q 6 hrs prn abd pain.

## 2014-12-09 NOTE — Telephone Encounter (Signed)
Pt called and made aware of med change.  Rx to pharmacy

## 2014-12-10 ENCOUNTER — Encounter: Payer: Self-pay | Admitting: Family Medicine

## 2014-12-13 ENCOUNTER — Encounter: Payer: Self-pay | Admitting: Family Medicine

## 2014-12-15 ENCOUNTER — Encounter: Payer: Self-pay | Admitting: Family Medicine

## 2014-12-16 ENCOUNTER — Encounter: Payer: Self-pay | Admitting: Family Medicine

## 2014-12-17 ENCOUNTER — Encounter: Payer: Self-pay | Admitting: Family Medicine

## 2014-12-17 MED ORDER — HYOSCYAMINE SULFATE 0.125 MG PO TABS: 0.1250 mg | ORAL_TABLET | Freq: Four times a day (QID) | ORAL | Status: DC | PRN

## 2014-12-17 NOTE — Telephone Encounter (Signed)
Med sent to pharm and pt aware 

## 2014-12-20 ENCOUNTER — Encounter: Payer: Self-pay | Admitting: Family Medicine

## 2014-12-20 MED ORDER — OXCARBAZEPINE 300 MG PO TABS: 300.0000 mg | ORAL_TABLET | Freq: Two times a day (BID) | ORAL | Status: DC

## 2014-12-22 ENCOUNTER — Telehealth: Payer: Self-pay | Admitting: Family Medicine

## 2014-12-22 ENCOUNTER — Encounter: Payer: Self-pay | Admitting: Family Medicine

## 2014-12-22 NOTE — Telephone Encounter (Signed)
Patient is calling to say that the pharmacy is not filling her hyoscyamine because of insurance, and she fell again would like to talk about this as well  [phone]

## 2014-12-23 NOTE — Telephone Encounter (Signed)
See mychart messages

## 2014-12-24 ENCOUNTER — Encounter: Payer: Self-pay | Admitting: Family Medicine

## 2014-12-27 ENCOUNTER — Encounter: Payer: Self-pay | Admitting: Family Medicine

## 2014-12-27 ENCOUNTER — Encounter (HOSPITAL_COMMUNITY): Payer: Self-pay | Admitting: Oncology

## 2014-12-27 ENCOUNTER — Encounter: Payer: Self-pay | Admitting: Vascular Surgery

## 2014-12-27 ENCOUNTER — Encounter: Payer: Self-pay | Admitting: Gastroenterology

## 2015-01-05 ENCOUNTER — Encounter: Payer: Self-pay | Admitting: Family Medicine

## 2015-01-06 ENCOUNTER — Encounter: Payer: Self-pay | Admitting: Family Medicine

## 2015-01-12 ENCOUNTER — Encounter: Payer: Self-pay | Admitting: Gastroenterology

## 2015-01-19 ENCOUNTER — Encounter: Payer: Self-pay | Admitting: Family Medicine

## 2015-01-19 ENCOUNTER — Other Ambulatory Visit (HOSPITAL_COMMUNITY): Payer: Self-pay | Admitting: Otolaryngology

## 2015-01-19 DIAGNOSIS — R1314 Dysphagia, pharyngoesophageal phase: Secondary | ICD-10-CM

## 2015-01-20 ENCOUNTER — Telehealth: Payer: Self-pay | Admitting: Family Medicine

## 2015-01-20 NOTE — Telephone Encounter (Signed)
PA submitted.

## 2015-01-20 NOTE — Telephone Encounter (Signed)
Pt needs Prior Auth for her Cyclobenzaprine (Flexeril). (not Oxcarbazepine (Trileptal))  Juliann Pulse from [facility] has called.  You can contact them at [phone]  Or online at [email or web address].

## 2015-01-25 ENCOUNTER — Encounter: Payer: Self-pay | Admitting: Family Medicine

## 2015-01-25 MED ORDER — HYOSCYAMINE SULFATE 0.125 MG PO TABS: 0.1250 mg | ORAL_TABLET | Freq: Four times a day (QID) | ORAL | Status: DC | PRN

## 2015-01-26 ENCOUNTER — Ambulatory Visit (HOSPITAL_COMMUNITY)
Admission: RE | Admit: 2015-01-26 | Discharge: 2015-01-26 | Disposition: A | Discharge status: Home / Self Care | Payer: PPO | Source: Ambulatory Visit | Attending: Otolaryngology | Admitting: Otolaryngology

## 2015-01-26 ENCOUNTER — Other Ambulatory Visit (HOSPITAL_COMMUNITY): Payer: Self-pay | Admitting: Otolaryngology

## 2015-01-26 DIAGNOSIS — R1314 Dysphagia, pharyngoesophageal phase: Secondary | ICD-10-CM

## 2015-01-26 DIAGNOSIS — Z8711 Personal history of peptic ulcer disease: Secondary | ICD-10-CM | POA: Diagnosis not present

## 2015-01-26 DIAGNOSIS — Z85118 Personal history of other malignant neoplasm of bronchus and lung: Secondary | ICD-10-CM | POA: Diagnosis not present

## 2015-01-26 DIAGNOSIS — E039 Hypothyroidism, unspecified: Secondary | ICD-10-CM | POA: Insufficient documentation

## 2015-01-26 DIAGNOSIS — K449 Diaphragmatic hernia without obstruction or gangrene: Secondary | ICD-10-CM | POA: Diagnosis not present

## 2015-01-26 DIAGNOSIS — J439 Emphysema, unspecified: Secondary | ICD-10-CM | POA: Diagnosis not present

## 2015-01-26 NOTE — Progress Notes (Signed)
Speech Language Pathology Patient Details Name: [name] MRN: [id number] DOB: [birth year] Today's Date: 01/26/2015 Time: 7494-4967 SLP Time Calculation (min) (ACUTE ONLY): 25 min       MBS completed. For results and recommendations, go to chart review, then imaging and click on DG swallow function.      Orbie Pyo Blaine.[employer] [phone]

## 2015-01-26 NOTE — Telephone Encounter (Signed)
PA approved from 01/21/15 until 10/08/2015 - will fax to pharm

## 2015-01-28 ENCOUNTER — Encounter: Payer: Self-pay | Admitting: Family Medicine

## 2015-01-28 MED ORDER — CYCLOBENZAPRINE HCL 10 MG PO TABS: 10.0000 mg | ORAL_TABLET | Freq: Three times a day (TID) | ORAL | Status: DC | PRN

## 2015-02-06 ENCOUNTER — Encounter: Payer: Self-pay | Admitting: Family Medicine

## 2015-02-07 ENCOUNTER — Encounter: Payer: Self-pay | Admitting: Family Medicine

## 2015-02-08 ENCOUNTER — Encounter: Payer: Self-pay | Admitting: Family Medicine

## 2015-02-08 ENCOUNTER — Ambulatory Visit (INDEPENDENT_AMBULATORY_CARE_PROVIDER_SITE_OTHER): Payer: PPO | Admitting: Family Medicine

## 2015-02-08 VITALS — BP 130/86 | HR 78 | Temp 98.1°F | Resp 18 | Ht 66.0 in | Wt 133.0 lb

## 2015-02-08 DIAGNOSIS — J438 Other emphysema: Secondary | ICD-10-CM

## 2015-02-08 MED ORDER — TIOTROPIUM BROMIDE-OLODATEROL 2.5-2.5 MCG/ACT IN AERS: 2.0000 | INHALATION_SPRAY | Freq: Every day | RESPIRATORY_TRACT | Status: DC

## 2015-02-08 NOTE — Progress Notes (Signed)
Subjective:    Patient ID: [name], female    DOB: [birth year], 74 y.o.   MRN: [id number]  HPI 10/05/14 Patient has a history of lung cancer her left lower lobe. In 2012 she underwent left lower lobe lung resection for treatment of this cancer. She also has a 30+ pack year history of smoking although she is quit. Patient states that she has had dyspnea on exertion for quite some time. However it is acutely worsened since her fall that she experienced in July. She reports oxygen desaturations with any activity. On room air today she is 88%. With ambulation she quickly drops to 82% walking less than 30 feet. On 2 L via nasal cannula oxygen level rises to 95% at rest. She was recently seen in urgent care and was diagnosed with left lower lobe pneumonia and was started on Levaquin for 10 days. Her cough is improving however on examination today she has bibasilar crackles and rales, decreased breath sounds and occasional expiratory wheezing.  At that time, my plan was:  Patient has hypoxia and apparently a COPD exacerbation. I will start the patient on a prednisone taper pack in addition I will continue her Levaquin for another 7 days. I will start the patient on albuterol 2 puffs inhaled every 4-6 hours as needed. Also arranged for the patient to have 2 L of oxygen via nasal cannula at home. Recheck in one week or immediately if worse.  10/11/14 Patient's breathing seems much better today. Pulse oximetry shows a SPO2 of 95% on 2 L at rest. She is 93% on room air at rest. With ambulation over 120 feet her pulse oximetry drops to 87% on room air but this is much improved compared to last week when she dropped to 82% after only 30 feet. She still has some faint Rales in the left lower lobe but it has much improved.   At that time, my plan was: Patient has improved dramatically. She still requires oxygen with ambulation she drops to 87% on room air with ambulation. However her trajectory shows significant  improvement in just one week. I would like to continue oxygen for an additional 1-2 weeks with ambulation and then I believe she may be able to discontinue the oxygen altogether. I will recheck the patient 2 weeks. Based on the results of her pulmonary function test, I will start the patient on stiolto 2 inh qday.   10/25/13 Patient's progress has plateaued. She is 92% on room air. However with ambulation she quickly drops to 86%. On examination today she continues to have faint left basilar crackles Rales. However there is no wheezing to explain the patient's hypoxia such as though was at her previous office visits. She has not been completely compliant with stiolto but she denies any benefit on this medication. She is scheduled to have a CT scan of her lungs on Thursday as a follow-up annually for her lung cancer. She also complains of chronic constipation for several months. She is only able to go if she takes stimulant laxatives. She also complains of left lower quadrant abdominal pain. She denies any melena or hematochezia. She has tried and failed Amitiza. She is tried and failed MiraLAX.  At that time, my plan was: I believe her left lower quadrant pain is likely chronic constipation. I will check her thyroid tests today to make sure that her levothyroxine is appropriately dosed. I will increase linzess to 290 mcg poqday.  Hopefully if I can get the  patient to go the restroom regularly, her abdominal pain will subside. If not we will proceed with a CT scan of the abdomen and possibly a colonoscopy. I would like the patient to get a CT scan of her chest because on examination today there is no definitive cause for her hypoxia. If her chest x-ray is stable, her hypoxia is due to COPD and also her previous lung resection for lung cancer and she will need chronic oxygen therapy.  02/08/15 With ambulation today, her SpO2 was 83 % on room air.  At rest she is 93% on room air.  She is not using stiolto regularly.   She has not been using her oxygen. She states that she feels fine.  However today on ambulation she is proved to be hypoxic. On examination she has bibasilar crackles consistent with atelectasis. She has kyphosis and poor posture. She sits in the chair stooped over drastically reducing her lung volumes. Past Medical History  Diagnosis Date  . COPD (chronic obstructive pulmonary disease)   . Anxiety disorder   . Hypothyroidism   . PUD (peptic ulcer disease)     27yr  . Dysphagia   . IBS (irritable bowel syndrome)   . Diverticulosis   . Trigeminal neuralgia   . Diastolic dysfunction   . Allergic rhinitis   . 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 Hiatal hernia   . Aortic aneurysm   . Complication of anesthesia 2011    bloodpressure dropped during colonoscopy, not problems since  . Shortness of breath     with exertion  . Aneurysm of carotid artery     corrected by surgery 08/21/13  . Compression fracture   . On home O2 12/15/2013    chronic hypoxia  . Depression   . Emphysema lung 11/08/2014   Past Surgical History  Procedure Laterality Date  . 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 Esophagogastroduodenoscopy  11/13/2009    w/dilation to 137m gastric ulceration (H.Pylori) s/p treatment  . Esophagogastroduodenoscopy  11/21/2009    distal esophageal web, gastritis  . Lung cancer surgery  12/2010    Left VATS, minithoracotomy, LLL superior segmentectomy  . Vocal cord surgery  02/06/2011    laryngoscopy with bilateral vocal cord Radiesse injection for vocal cord paralysis  . Abdominal hysterectomy    . Cholecystectomy    . Tubal ligation    . Cataract extraction    . Fatty tumor removal from lt groin    . Cataract extraction w/phaco  09/02/2012    Procedure: CATARACT EXTRACTION PHACO AND INTRAOCULAR LENS PLACEMENT (IOC);  Surgeon: MaElta Guadeloupe. ShGershon CraneMD;  Location: [facility];  Service: Ophthalmology;  Laterality: Left;  CDE:10.35    . Endarterectomy Right 08/21/2013    Procedure: RIGHT CAROTID ANEURYSM RESECTION;  Surgeon: ToRosetta PosnerMD;  [facility];  Service: Vascular;  Laterality: Right;  . Fracture surgery Left     hand and left ring finger  . Orif wrist fracture Left 04/09/2014    Procedure: OPEN REDUCTION INTERNAL FIXATION (ORIF) WRIST FRACTURE;  Surgeon: TiRenette ButtersMD;  [facility];  Laterality: Left;  . Percutaneous pinning Left 04/09/2014    Procedure: PERCUTANEOUS PINNING EXTREMITY;  Surgeon: TiRenette ButtersMD;  [facility];  Laterality: Left;  . Esophagogastroduodenoscopy  03/14/12    SLQQV:[id number] the distal esophagus/Mild gastritis/small HH. + H.pylori, prescribed Pylera.  Finished treatment.   . Back surgery  January 13, 2014  . Colonoscopy N/A 11/23/2014    Procedure: COLONOSCOPY;  Surgeon: Danie Binder, MD;  [facility]: [facility];  Service: Endoscopy;  Laterality: N/A;  930    Current Outpatient Prescriptions on File Prior to Visit  Medication Sig Dispense Refill  . alendronate (FOSAMAX) 70 MG tablet Take 1 tablet (70 mg total) by mouth once a week. Take with a full glass of water on an empty stomach. Take on sundays 12 tablet 4  . calcium citrate-vitamin D (CITRACAL+D) 315-200 MG-UNIT per tablet Take 1 tablet by mouth daily.     . Cholecalciferol (VITAMIN D-3) 5000 UNITS TABS Take 5,000 Units by mouth daily.     . citalopram (CELEXA) 40 MG tablet Take 1 tablet (40 mg total) by mouth at bedtime. 90 tablet 4  . cyclobenzaprine (FLEXERIL) 10 MG tablet Take 1 tablet (10 mg total) by mouth every 8 (eight) hours as needed for muscle spasms. 90 tablet 4  . diphenhydrAMINE (BENADRYL) 50 MG tablet Take 1 tablet (50 mg total) by mouth at bedtime as needed for itching. Take 1 tablet 1 hour prior to study 1 tablet 0  . fish oil-omega-3 fatty acids 1000 MG capsule Take 1 g by mouth daily.     . hyoscyamine (LEVSIN, ANASPAZ) 0.125 MG tablet Take 1  tablet (0.125 mg total) by mouth every 6 (six) hours as needed. 120 tablet 5  . ibuprofen (ADVIL,MOTRIN) 200 MG tablet Take 400 mg by mouth every 6 (six) hours as needed (for pain).    Marland Kitchen levothyroxine (SYNTHROID, LEVOTHROID) 25 MCG tablet Take 1 tablet (25 mcg total) by mouth daily before breakfast. 90 tablet 4  . Multiple Vitamin (MULTIVITAMIN WITH MINERALS) TABS Take 1 tablet by mouth daily.    . Oxcarbazepine (TRILEPTAL) 300 MG tablet Take 1 tablet (300 mg total) by mouth 2 (two) times daily. 180 tablet 3   No current facility-administered medications on file prior to visit.   Allergies  Allergen Reactions  . Ciprofloxacin Hcl Hives  . Iohexol      Desc: Per [facility], pt is allergic to IV contrast, no type of reaction was available. Pt cannot remember but first reacted around 15 yrs ago from an IVP. Notes were date from 2009 at urology center., Onset Date: [id number]   . Prozac [Fluoxetine Hcl] Rash  . Sulfonamide Derivatives Hives and Rash   History   Social History  . Marital Status: Divorced    Spouse Name: N/A  . Number of Children: N/A  . Years of Education: N/A   Occupational History  . Not on file.   Social History Main Topics  . Smoking status: Former Smoker -- 0.50 packs/day for 20 years    Types: Cigarettes    Quit date: 12/21/2010  . Smokeless tobacco: Never Used     Comment: smoking cessation info given and reviewed   . Alcohol Use: Yes     Comment: seldom  . Drug Use: No  . Sexual Activity: Yes    Birth Control/ Protection: Surgical   Other Topics Concern  . Not on file   Social History Narrative      Review of Systems  All other systems reviewed and are negative.      Objective:   Physical Exam  Cardiovascular: Normal rate, regular rhythm and normal heart sounds.   Pulmonary/Chest: She has no decreased breath sounds. She has no wheezes. She has rales in the left  lower field.  Abdominal: Soft. Bowel sounds are normal.  Musculoskeletal:  She exhibits no edema.  Vitals reviewed.  10/05/14 Pulmonary function tests show an FEV1 to FVC ratio of 29%. FEV1 is 0.77 L consistent with 36% of predicted. This would qualify the patient for borderline stage IV COPD  10/11/13 Repeat pulmonary function test performed today with a patient in a more stable condition reveal     FEV1 to FVC ratio 33% with an FEV1 of 0.85 L consistent with 40% of predicted. Therefore the patient has stage III COPD Assessment & Plan:  Other emphysema - Plan: Tiotropium Bromide-Olodaterol (STIOLTO RESPIMAT) 2.5-2.5 MCG/ACT AERS   I explained to the patient that she has stage III COPD. She is oxygen dependent. I recommended 2 L via nasal cannula with ambulation permanently. I recommended using a back brace to help improve her posture. I recommended incentive spirometry exercises at home to help clear the atelectasis and prevent infection. I recommended compliance with stiolto to prevent future COPD exacerbations and improve lung function.   Recheck in 6 months or as needed

## 2015-02-25 ENCOUNTER — Telehealth: Payer: Self-pay | Admitting: Family Medicine

## 2015-02-25 ENCOUNTER — Encounter: Payer: Self-pay | Admitting: Family Medicine

## 2015-02-25 NOTE — Telephone Encounter (Signed)
Noted  

## 2015-02-25 NOTE — Telephone Encounter (Signed)
Patient calling to let you know that she is still having the back pain with no relief, wants to discuss some type of pain medication [phone]

## 2015-02-25 NOTE — Telephone Encounter (Signed)
[name], [name] call to let you know that she found some muscle relaxer medication that she is going to try, and will contact you if that does not work, you don't have to call her back [phone]

## 2015-02-28 ENCOUNTER — Encounter: Payer: Self-pay | Admitting: Family Medicine

## 2015-03-01 ENCOUNTER — Encounter: Payer: Self-pay | Admitting: Family Medicine

## 2015-03-04 ENCOUNTER — Encounter: Payer: Self-pay | Admitting: Family Medicine

## 2015-03-04 MED ORDER — TRAMADOL HCL 50 MG PO TABS: 50.0000 mg | ORAL_TABLET | Freq: Three times a day (TID) | ORAL | Status: DC | PRN

## 2015-03-04 NOTE — Telephone Encounter (Signed)
Med called to pharm and pt aware via mychart

## 2015-03-07 ENCOUNTER — Encounter: Payer: Self-pay | Admitting: Family Medicine

## 2015-03-08 ENCOUNTER — Encounter: Payer: Self-pay | Admitting: Family Medicine

## 2015-03-09 ENCOUNTER — Encounter: Payer: Self-pay | Admitting: Family Medicine

## 2015-03-10 ENCOUNTER — Other Ambulatory Visit: Payer: Self-pay | Admitting: Family Medicine

## 2015-03-10 ENCOUNTER — Encounter: Payer: Self-pay | Admitting: Family Medicine

## 2015-03-10 DIAGNOSIS — M545 Low back pain, unspecified: Secondary | ICD-10-CM

## 2015-03-14 ENCOUNTER — Ambulatory Visit (HOSPITAL_COMMUNITY)
Admission: RE | Admit: 2015-03-14 | Discharge: 2015-03-14 | Disposition: A | Discharge status: Home / Self Care | Payer: PPO | Source: Ambulatory Visit | Attending: Family Medicine | Admitting: Family Medicine

## 2015-03-14 ENCOUNTER — Encounter: Payer: Self-pay | Admitting: Family Medicine

## 2015-03-14 ENCOUNTER — Ambulatory Visit (INDEPENDENT_AMBULATORY_CARE_PROVIDER_SITE_OTHER): Payer: PPO | Admitting: Family Medicine

## 2015-03-14 VITALS — BP 128/76 | HR 84 | Temp 97.8°F | Resp 18 | Ht 66.0 in | Wt 143.0 lb

## 2015-03-14 DIAGNOSIS — J449 Chronic obstructive pulmonary disease, unspecified: Secondary | ICD-10-CM | POA: Insufficient documentation

## 2015-03-14 DIAGNOSIS — M545 Low back pain, unspecified: Secondary | ICD-10-CM

## 2015-03-14 DIAGNOSIS — M419 Scoliosis, unspecified: Secondary | ICD-10-CM | POA: Diagnosis not present

## 2015-03-14 DIAGNOSIS — R296 Repeated falls: Secondary | ICD-10-CM | POA: Diagnosis not present

## 2015-03-14 DIAGNOSIS — R918 Other nonspecific abnormal finding of lung field: Secondary | ICD-10-CM | POA: Insufficient documentation

## 2015-03-14 DIAGNOSIS — R0600 Dyspnea, unspecified: Secondary | ICD-10-CM

## 2015-03-14 DIAGNOSIS — R42 Dizziness and giddiness: Secondary | ICD-10-CM | POA: Insufficient documentation

## 2015-03-14 DIAGNOSIS — R29898 Other symptoms and signs involving the musculoskeletal system: Secondary | ICD-10-CM | POA: Diagnosis not present

## 2015-03-14 DIAGNOSIS — R0602 Shortness of breath: Secondary | ICD-10-CM | POA: Insufficient documentation

## 2015-03-14 NOTE — Progress Notes (Signed)
Subjective:    Patient ID: [name], female    DOB: [birth year], 74 y.o.   MRN: [id number]  HPI Patient presents today complaining of dyspnea on exertion for last 2 weeks. She has a history of stage III COPD. She is compliant using stiolto.  She has been fairly compliant wearing oxygen at home. As her shortness of breath has worsened, she has been less and less active. This is lead to deconditioning and weakness in both legs. The deconditioning and weakness in both legs is subsequently led to several falls. She also reports intermittent pain in her lower back after a fall. Although this is gradually improved over the last week. Today on examination she has no tenderness to palpation over the spinous processes in the lumbar thoracic spine. However she has tremendous weakness upon standing and great difficulty standing without holding onto the desk or a walker. She is ambulating using a walker. She was not doing this several weeks ago. On examination she also has diminished breath sounds throughout with a neck support wheezes and faint left basilar crackles Past Medical History  Diagnosis Date  . COPD (chronic obstructive pulmonary disease)   . Anxiety disorder   . Hypothyroidism   . PUD (peptic ulcer disease)     2yr  . Dysphagia   . IBS (irritable bowel syndrome)   . Diverticulosis   . Trigeminal neuralgia   . Diastolic dysfunction   . Allergic rhinitis   . 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 Hiatal hernia   . Aortic aneurysm   . Complication of anesthesia 2011    bloodpressure dropped during colonoscopy, not problems since  . Shortness of breath     with exertion  . Aneurysm of carotid artery     corrected by surgery 08/21/13  . Compression fracture   . On home O2 12/15/2013    chronic hypoxia  . Depression   . Emphysema lung 11/08/2014   Past Surgical History  Procedure Laterality Date  . Colonoscopy  2011    hyperplastic polyp, 3-4 small cecal AVMs,  nonbleeding  . Esophagogastroduodenoscopy  11/13/2009    w/dilation to 14m gastric ulceration (H.Pylori) s/p treatment  . Esophagogastroduodenoscopy  11/21/2009    distal esophageal web, gastritis  . Lung cancer surgery  12/2010    Left VATS, minithoracotomy, LLL superior segmentectomy  . Vocal cord surgery  02/06/2011    laryngoscopy with bilateral vocal cord Radiesse injection for vocal cord paralysis  . Abdominal hysterectomy    . Cholecystectomy    . Tubal ligation    . Cataract extraction    . Fatty tumor removal from lt groin    . Cataract extraction w/phaco  09/02/2012    Procedure: CATARACT EXTRACTION PHACO AND INTRAOCULAR LENS PLACEMENT (IOC);  Surgeon: MaElta Guadeloupe. ShGershon CraneMD;  Location: [facility];  Service: Ophthalmology;  Laterality: Left;  CDE:10.35  . Endarterectomy Right 08/21/2013    Procedure: RIGHT CAROTID ANEURYSM RESECTION;  Surgeon: ToRosetta PosnerMD;  [facility];  Service: Vascular;  Laterality: Right;  . Fracture surgery Left     hand and left ring finger  . Orif wrist fracture Left 04/09/2014    Procedure: OPEN REDUCTION INTERNAL FIXATION (ORIF) WRIST FRACTURE;  Surgeon: TiRenette ButtersMD;  [facility];  Laterality: Left;  . Percutaneous pinning Left 04/09/2014    Procedure: PERCUTANEOUS PINNING EXTREMITY;  Surgeon: TiRenette ButtersMD;  [facility]  Service: Orthopedics;  Laterality: Left;  . Esophagogastroduodenoscopy  03/14/12    WEX:[id number] in the distal esophagus/Mild gastritis/small HH. + H.pylori, prescribed Pylera. Finished treatment.   . Back surgery  January 13, 2014  . Colonoscopy N/A 11/23/2014    Procedure: COLONOSCOPY;  Surgeon: Danie Binder, MD;  [facility];  Service: Endoscopy;  Laterality: N/A;  930    Current Outpatient Prescriptions on File Prior to Visit  Medication Sig Dispense Refill  . alendronate (FOSAMAX) 70 MG tablet Take 1 tablet (70 mg total) by mouth once a week. Take with a full glass of water  on an empty stomach. Take on sundays 12 tablet 4  . calcium citrate-vitamin D (CITRACAL+D) 315-200 MG-UNIT per tablet Take 1 tablet by mouth daily.     . Cholecalciferol (VITAMIN D-3) 5000 UNITS TABS Take 5,000 Units by mouth daily.     . citalopram (CELEXA) 40 MG tablet Take 1 tablet (40 mg total) by mouth at bedtime. 90 tablet 4  . cyclobenzaprine (FLEXERIL) 10 MG tablet Take 1 tablet (10 mg total) by mouth every 8 (eight) hours as needed for muscle spasms. 90 tablet 4  . diphenhydrAMINE (BENADRYL) 50 MG tablet Take 1 tablet (50 mg total) by mouth at bedtime as needed for itching. Take 1 tablet 1 hour prior to study 1 tablet 0  . fish oil-omega-3 fatty acids 1000 MG capsule Take 1 g by mouth daily.     . hyoscyamine (LEVSIN, ANASPAZ) 0.125 MG tablet Take 1 tablet (0.125 mg total) by mouth every 6 (six) hours as needed. 120 tablet 5  . ibuprofen (ADVIL,MOTRIN) 200 MG tablet Take 400 mg by mouth every 6 (six) hours as needed (for pain).    Marland Kitchen levothyroxine (SYNTHROID, LEVOTHROID) 25 MCG tablet Take 1 tablet (25 mcg total) by mouth daily before breakfast. 90 tablet 4  . Multiple Vitamin (MULTIVITAMIN WITH MINERALS) TABS Take 1 tablet by mouth daily.    . Oxcarbazepine (TRILEPTAL) 300 MG tablet Take 1 tablet (300 mg total) by mouth 2 (two) times daily. 180 tablet 3  . Tiotropium Bromide-Olodaterol (STIOLTO RESPIMAT) 2.5-2.5 MCG/ACT AERS Inhale 2 Inhalers into the lungs daily. 1 Inhaler 5  . traMADol (ULTRAM) 50 MG tablet Take 1 tablet (50 mg total) by mouth every 8 (eight) hours as needed. 30 tablet 0   No current facility-administered medications on file prior to visit.   Allergies  Allergen Reactions  . Ciprofloxacin Hcl Hives  . Iohexol      Desc: Per [facility], pt is allergic to IV contrast, no type of reaction was available. Pt cannot remember but first reacted around 15 yrs ago from an IVP. Notes were date from 2009 at urology center., Onset Date: [id number]   . Prozac [Fluoxetine Hcl]  Rash  . Sulfonamide Derivatives Hives and Rash   History   Social History  . Marital Status: Divorced    Spouse Name: N/A  . Number of Children: N/A  . Years of Education: N/A   Occupational History  . Not on file.   Social History Main Topics  . Smoking status: Former Smoker -- 0.50 packs/day for 20 years    Types: Cigarettes    Quit date: 12/21/2010  . Smokeless tobacco: Never Used     Comment: smoking cessation info given and reviewed   . Alcohol Use: Yes     Comment: seldom  . Drug Use: No  . Sexual Activity: Yes    Birth Control/ Protection: Surgical  Other Topics Concern  . Not on file   Social History Narrative      Review of Systems  All other systems reviewed and are negative.      Objective:   Physical Exam  Constitutional: She appears well-developed and well-nourished.  Neck: Neck supple.  Cardiovascular: Normal rate, regular rhythm and normal heart sounds.   Pulmonary/Chest: Effort normal. She has decreased breath sounds. She has wheezes.  Abdominal: Soft. Bowel sounds are normal.  Vitals reviewed.         Assessment & Plan:  Dyspnea - Plan: DG Chest 2 View  Weakness of both legs  Falls frequently  I believe patient's dyspnea has led to deconditioning and weakness in both legs. I believe the deconditioning and weakness in her legs has led to falls due to disequilibrium. I believe the falls strained an injured her lower back. I believe therefore we need to 2 puffs inhaled every 6 hours and prednisone for 7 days. Once her breathing is better, I would recommend home physical therapy to try to improve the strength in her legs and to correct her deconditioning. Hopefully this will help with her disequilibrium and poor balance.  We'll await the results of the chest x-ray

## 2015-03-15 ENCOUNTER — Encounter: Payer: Self-pay | Admitting: Family Medicine

## 2015-03-15 ENCOUNTER — Other Ambulatory Visit: Payer: Self-pay | Admitting: Family Medicine

## 2015-03-15 MED ORDER — PREDNISONE 20 MG PO TABS: 40.0000 mg | ORAL_TABLET | Freq: Every day | ORAL | Status: DC

## 2015-03-15 MED ORDER — ALBUTEROL SULFATE HFA 108 (90 BASE) MCG/ACT IN AERS: 2.0000 | INHALATION_SPRAY | Freq: Four times a day (QID) | RESPIRATORY_TRACT | Status: DC | PRN

## 2015-03-17 ENCOUNTER — Encounter: Payer: Self-pay | Admitting: Family Medicine

## 2015-03-18 ENCOUNTER — Encounter: Payer: Self-pay | Admitting: Vascular Surgery

## 2015-03-18 ENCOUNTER — Encounter: Payer: Self-pay | Admitting: Family Medicine

## 2015-03-21 ENCOUNTER — Encounter: Payer: Self-pay | Admitting: Family Medicine

## 2015-03-22 ENCOUNTER — Encounter: Payer: Self-pay | Admitting: Family Medicine

## 2015-03-22 ENCOUNTER — Ambulatory Visit (INDEPENDENT_AMBULATORY_CARE_PROVIDER_SITE_OTHER)
Admission: RE | Admit: 2015-03-22 | Discharge: 2015-03-22 | Disposition: A | Discharge status: Home / Self Care | Payer: PPO | Source: Ambulatory Visit | Attending: Vascular Surgery | Admitting: Vascular Surgery

## 2015-03-22 ENCOUNTER — Ambulatory Visit (INDEPENDENT_AMBULATORY_CARE_PROVIDER_SITE_OTHER): Payer: PPO | Admitting: Vascular Surgery

## 2015-03-22 ENCOUNTER — Encounter: Payer: Self-pay | Admitting: Vascular Surgery

## 2015-03-22 ENCOUNTER — Ambulatory Visit (INDEPENDENT_AMBULATORY_CARE_PROVIDER_SITE_OTHER): Payer: PPO | Admitting: Family Medicine

## 2015-03-22 ENCOUNTER — Ambulatory Visit (HOSPITAL_COMMUNITY)
Admission: RE | Admit: 2015-03-22 | Discharge: 2015-03-22 | Disposition: A | Discharge status: Home / Self Care | Payer: PPO | Source: Ambulatory Visit | Attending: Vascular Surgery | Admitting: Vascular Surgery

## 2015-03-22 VITALS — BP 106/68 | HR 72 | Temp 98.3°F | Resp 18 | Ht 66.0 in | Wt 140.0 lb

## 2015-03-22 VITALS — BP 121/74 | HR 76 | Ht 66.0 in | Wt 137.5 lb

## 2015-03-22 DIAGNOSIS — I723 Aneurysm of iliac artery: Secondary | ICD-10-CM | POA: Insufficient documentation

## 2015-03-22 DIAGNOSIS — I720 Aneurysm of carotid artery: Secondary | ICD-10-CM

## 2015-03-22 DIAGNOSIS — I714 Abdominal aortic aneurysm, without rupture, unspecified: Secondary | ICD-10-CM

## 2015-03-22 DIAGNOSIS — R29898 Other symptoms and signs involving the musculoskeletal system: Secondary | ICD-10-CM | POA: Diagnosis not present

## 2015-03-22 DIAGNOSIS — I6523 Occlusion and stenosis of bilateral carotid arteries: Secondary | ICD-10-CM | POA: Diagnosis not present

## 2015-03-22 NOTE — Progress Notes (Signed)
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 Also for follow-up of known small infrarenal abdominal aortic aneurysm. She is walking with a walker today. She has had a prior back fracture and has some weakness associated with this. She reports that after my last visit with her she had a fall resulting in a left wrist fracture requiring patent plating. She has no symptoms are full to visit her aneurysm in his had no neurologic deficits  Past Medical History  Diagnosis Date  . COPD (chronic obstructive pulmonary disease)   . Anxiety disorder   . Hypothyroidism   . PUD (peptic ulcer disease)     40yr  . Dysphagia   . IBS (irritable bowel syndrome)   . Diverticulosis   . Trigeminal neuralgia   . Diastolic dysfunction   . Allergic rhinitis   . 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 Hiatal hernia   . Aortic aneurysm   . Complication of anesthesia 2011    bloodpressure dropped during colonoscopy, not problems since  . Shortness of breath     with exertion  . Aneurysm of carotid artery     corrected by surgery 08/21/13  . Compression fracture   . On home O2 12/15/2013    chronic hypoxia  . Depression   . Emphysema lung 11/08/2014    History  Substance Use Topics  . Smoking status: Former Smoker -- 0.50 packs/day for 20 years    Types: Cigarettes    Quit date: 12/21/2010  . Smokeless tobacco: Never Used     Comment: smoking cessation info given and reviewed   . Alcohol Use: 0.0 oz/week    0 Standard drinks or equivalent per week     Comment: seldom    Family History  Problem Relation Age of Onset  . Liver cancer Sister   . Cancer Sister   . Pulmonary embolism Mother   . Heart attack Father   . Hypertension Father   . Cancer Brother   . Cancer Daughter   . Colon cancer Neg Hx     Allergies  Allergen Reactions  . Ciprofloxacin Hcl Hives  . Iohexol      Desc: Per [facility], pt is allergic to IV contrast, no type of  reaction was available. Pt cannot remember but first reacted around 15 yrs ago from an IVP. Notes were date from 2009 at urology center., Onset Date: [id number]  . Prozac [Fluoxetine Hcl] Rash  . Sulfonamide Derivatives Hives and Rash     Current outpatient prescriptions:  .  calcium citrate-vitamin D (CITRACAL+D) 315-200 MG-UNIT per tablet, Take 1 tablet by mouth daily. , Disp: , Rfl:  .  Cholecalciferol (VITAMIN D-3) 5000 UNITS TABS, Take 5,000 Units by mouth daily. , Disp: , Rfl:  .  citalopram (CELEXA) 40 MG tablet, Take 1 tablet (40 mg total) by mouth at bedtime., Disp: 90 tablet, Rfl: 4 .  cyclobenzaprine (FLEXERIL) 10 MG tablet, Take 1 tablet (10 mg total) by mouth every 8 (eight) hours as needed for muscle spasms., Disp: 90 tablet, Rfl: 4 .  fish oil-omega-3 fatty acids 1000 MG capsule, Take 1 g by mouth daily. , Disp: , Rfl:  .  hyoscyamine (LEVSIN, ANASPAZ) 0.125 MG tablet, Take 1 tablet (0.125 mg total) by mouth every 6 (six) hours as needed., Disp: 120 tablet, Rfl: 5 .  levothyroxine (SYNTHROID, LEVOTHROID) 25 MCG tablet, Take 1 tablet (25 mcg total) by mouth daily before  breakfast., Disp: 90 tablet, Rfl: 4 .  Multiple Vitamin (MULTIVITAMIN WITH MINERALS) TABS, Take 1 tablet by mouth daily., Disp: , Rfl:  .  Oxcarbazepine (TRILEPTAL) 300 MG tablet, Take 1 tablet (300 mg total) by mouth 2 (two) times daily., Disp: 180 tablet, Rfl: 3 .  predniSONE (DELTASONE) 20 MG tablet, Take 2 tablets (40 mg total) by mouth daily with breakfast. X 7 days, Disp: 14 tablet, Rfl: 0 .  Tiotropium Bromide-Olodaterol (STIOLTO RESPIMAT) 2.5-2.5 MCG/ACT AERS, Inhale 2 Inhalers into the lungs daily., Disp: 1 Inhaler, Rfl: 5 .  traMADol (ULTRAM) 50 MG tablet, Take 1 tablet (50 mg total) by mouth every 8 (eight) hours as needed., Disp: 30 tablet, Rfl: 0 .  albuterol (PROVENTIL HFA;VENTOLIN HFA) 108 (90 BASE) MCG/ACT inhaler, Inhale 2 puffs into the lungs every 6 (six) hours as needed for wheezing or shortness of  breath. (Patient not taking: Reported on 03/22/2015), Disp: 1 Inhaler, Rfl: 2 .  alendronate (FOSAMAX) 70 MG tablet, Take 1 tablet (70 mg total) by mouth once a week. Take with a full glass of water on an empty stomach. Take on sundays (Patient not taking: Reported on 03/22/2015), Disp: 12 tablet, Rfl: 4 .  diphenhydrAMINE (BENADRYL) 50 MG tablet, Take 1 tablet (50 mg total) by mouth at bedtime as needed for itching. Take 1 tablet 1 hour prior to study (Patient not taking: Reported on 03/22/2015), Disp: 1 tablet, Rfl: 0 .  ibuprofen (ADVIL,MOTRIN) 200 MG tablet, Take 400 mg by mouth every 6 (six) hours as needed (for pain)., Disp: , Rfl:   Filed Vitals:   03/22/15 1121  BP: 121/74  Pulse: 76  Height: '5\' 6"'$  (1.676 m)  Weight: 137 lb 8 oz (62.37 kg)  SpO2: 94%    Body mass index is 22.2 kg/(m^2).       On physical exam will well-nourished female no acute distress Grossly intact neurologically Right neck Incision is well-healed with normal ulceration and no carotid bruits bilaterally Abdomen soft and nontender. No bruits present and do not palpate an aneurysm Easily palpable dorsalis pedis pulses bilaterally  She underwent noninvasive vascular laboratory studies neuropathy today and I reviewed them with her. This shows maximal diameter of the infrarenal aorta at 3.3 cm with no significant change since her study from one year ago. Carotid duplex shows widely patent area of carotid resection from her prior aneurysm and no evidence of stenosis or aneurysmal left carotid system  Pression and plan stable from resection of right carotid aneurysm and no change in infrarenal abdominal aortic aneurysm.  She was reassured these findings will be seen again in 2 years with repeat ultrasound and physical exam

## 2015-03-22 NOTE — Progress Notes (Signed)
Subjective:    Patient ID: [name], female    DOB: [birth year], 74 y.o.   MRN: [id number]  HPI 03/14/15 Patient presents today complaining of dyspnea on exertion for last 2 weeks. She has a history of stage III COPD. She is compliant using stiolto.  She has been fairly compliant wearing oxygen at home. As her shortness of breath has worsened, she has been less and less active. This has lead to deconditioning and weakness in both legs. The deconditioning and weakness in both legs is subsequently led to several falls. She also reports intermittent pain in her lower back after a fall. Although this is gradually improved over the last week. Today on examination she has no tenderness to palpation over the spinous processes in the lumbar thoracic spine. However she has tremendous weakness upon standing and great difficulty standing without holding onto the desk or a walker. She is ambulating using a walker. She was not doing this several weeks ago. On examination she also has diminished breath sounds throughout with wheezes and faint left basilar crackles.    At that time, my plan was: I believe patient's dyspnea has led to deconditioning and weakness in both legs. I believe the deconditioning and weakness in her legs has led to falls due to disequilibrium. I believe the falls strained an injured her lower back. I believe therefore we need to focus on treating COPD by using albuterol 2 puffs inhaled every 6 hours and prednisone for 7 days. Once her breathing is better, I would recommend home physical therapy to try to improve the strength in her legs and to correct her deconditioning. Hopefully this will help with her disequilibrium and poor balance.  We'll await the results of the chest x-ray.   Patient is here today for follow-up. Her breathing is better. The chest x-ray revealed no infiltrate. The patient is now off the prednisone. Her breathing is back to her baseline COPD. She still very weak. She  still is trace deconditioning. Muscle strength in her hip flexors and hip extensors is 3-4 over 5 at best. She has a difficult time standing from a chair without holding on. She has to hold onto a walker to walk. She has fallen several times at home recently Past Medical History  Diagnosis Date  . COPD (chronic obstructive pulmonary disease)   . Anxiety disorder   . Hypothyroidism   . PUD (peptic ulcer disease)     63yr  . Dysphagia   . IBS (irritable bowel syndrome)   . Diverticulosis   . Trigeminal neuralgia   . Diastolic dysfunction   . Allergic rhinitis   . 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 Hiatal hernia   . Aortic aneurysm   . Complication of anesthesia 2011    bloodpressure dropped during colonoscopy, not problems since  . Shortness of breath     with exertion  . Aneurysm of carotid artery     corrected by surgery 08/21/13  . Compression fracture   . On home O2 12/15/2013    chronic hypoxia  . Depression   . Emphysema lung 11/08/2014   Past Surgical History  Procedure Laterality Date  . 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 Esophagogastroduodenoscopy  11/13/2009    w/dilation to 179m gastric ulceration (H.Pylori) s/p treatment  . Esophagogastroduodenoscopy  11/21/2009    distal esophageal web, gastritis  . Lung cancer surgery  12/2010  Left VATS, minithoracotomy, LLL superior segmentectomy  . Vocal cord surgery  02/06/2011    laryngoscopy with bilateral vocal cord Radiesse injection for vocal cord paralysis  . Abdominal hysterectomy    . Cholecystectomy    . Tubal ligation    . Cataract extraction    . Fatty tumor removal from lt groin    . Cataract extraction w/phaco  09/02/2012    Procedure: CATARACT EXTRACTION PHACO AND INTRAOCULAR LENS PLACEMENT (IOC);  Surgeon: Elta Guadeloupe T. Gershon Crane, MD;  Location: [facility];  Service: Ophthalmology;  Laterality: Left;  CDE:10.35  . Endarterectomy Right 08/21/2013     Procedure: RIGHT CAROTID ANEURYSM RESECTION;  Surgeon: Rosetta Posner, MD;  Location: [facility] OR;  Service: Vascular;  Laterality: Right;  . Fracture surgery Left     hand and left ring finger  . Orif wrist fracture Left 04/09/2014    Procedure: OPEN REDUCTION INTERNAL FIXATION (ORIF) WRIST FRACTURE;  Surgeon: Renette Butters, MD;  Location: [facility];  Service: Orthopedics;  Laterality: Left;  . Percutaneous pinning Left 04/09/2014    Procedure: PERCUTANEOUS PINNING EXTREMITY;  Surgeon: Renette Butters, MD;  Location: [facility];  Service: Orthopedics;  Laterality: Left;  . Esophagogastroduodenoscopy  03/14/12    BMW:[id number] in the distal esophagus/Mild gastritis/small HH. + H.pylori, prescribed Pylera. Finished treatment.   . Back surgery  January 13, 2014  . Colonoscopy N/A 11/23/2014    Procedure: COLONOSCOPY;  Surgeon: Danie Binder, MD;  Location: [facility];  Service: Endoscopy;  Laterality: N/A;  930    Current Outpatient Prescriptions on File Prior to Visit  Medication Sig Dispense Refill  . calcium citrate-vitamin D (CITRACAL+D) 315-200 MG-UNIT per tablet Take 1 tablet by mouth daily.     . Cholecalciferol (VITAMIN D-3) 5000 UNITS TABS Take 5,000 Units by mouth daily.     . citalopram (CELEXA) 40 MG tablet Take 1 tablet (40 mg total) by mouth at bedtime. 90 tablet 4  . cyclobenzaprine (FLEXERIL) 10 MG tablet Take 1 tablet (10 mg total) by mouth every 8 (eight) hours as needed for muscle spasms. 90 tablet 4  . diphenhydrAMINE (BENADRYL) 50 MG tablet Take 1 tablet (50 mg total) by mouth at bedtime as needed for itching. Take 1 tablet 1 hour prior to study 1 tablet 0  . fish oil-omega-3 fatty acids 1000 MG capsule Take 1 g by mouth daily.     . hyoscyamine (LEVSIN, ANASPAZ) 0.125 MG tablet Take 1 tablet (0.125 mg total) by mouth every 6 (six) hours as needed. 120 tablet 5  . ibuprofen (ADVIL,MOTRIN) 200 MG tablet Take 400 mg by mouth every 6 (six) hours as needed (for pain).    Marland Kitchen levothyroxine  (SYNTHROID, LEVOTHROID) 25 MCG tablet Take 1 tablet (25 mcg total) by mouth daily before breakfast. 90 tablet 4  . Multiple Vitamin (MULTIVITAMIN WITH MINERALS) TABS Take 1 tablet by mouth daily.    . Oxcarbazepine (TRILEPTAL) 300 MG tablet Take 1 tablet (300 mg total) by mouth 2 (two) times daily. 180 tablet 3  . predniSONE (DELTASONE) 20 MG tablet Take 2 tablets (40 mg total) by mouth daily with breakfast. X 7 days 14 tablet 0  . Tiotropium Bromide-Olodaterol (STIOLTO RESPIMAT) 2.5-2.5 MCG/ACT AERS Inhale 2 Inhalers into the lungs daily. 1 Inhaler 5  . traMADol (ULTRAM) 50 MG tablet Take 1 tablet (50 mg total) by mouth every 8 (eight) hours as needed. 30 tablet 0   No current facility-administered medications on file prior to visit.  Allergies  Allergen Reactions  . Ciprofloxacin Hcl Hives  . Iohexol      Desc: Per [facility], pt is allergic to IV contrast, no type of reaction was available. Pt cannot remember but first reacted around 15 yrs ago from an IVP. Notes were date from 2009 at urology center., Onset Date: [id number]   . Prozac [Fluoxetine Hcl] Rash  . Sulfonamide Derivatives Hives and Rash   History   Social History  . Marital Status: Divorced    Spouse Name: N/A  . Number of Children: N/A  . Years of Education: N/A   Occupational History  . Not on file.   Social History Main Topics  . Smoking status: Former Smoker -- 0.50 packs/day for 20 years    Types: Cigarettes    Quit date: 12/21/2010  . Smokeless tobacco: Never Used     Comment: smoking cessation info given and reviewed   . Alcohol Use: 0.0 oz/week    0 Standard drinks or equivalent per week     Comment: seldom  . Drug Use: No  . Sexual Activity: Yes    Birth Control/ Protection: Surgical   Other Topics Concern  . Not on file   Social History Narrative      Review of Systems  All other systems reviewed and are negative.      Objective:   Physical Exam  Constitutional: She appears  well-developed and well-nourished.  Neck: Neck supple.  Cardiovascular: Normal rate, regular rhythm and normal heart sounds.   Pulmonary/Chest: Effort normal. She has decreased breath sounds. She has wheezes.  Abdominal: Soft. Bowel sounds are normal.  Vitals reviewed.         Assessment & Plan:  Leg weakness, bilateral - Plan: Ambulatory referral to Physical Therapy  I believe the patient's dyspnea is now at her baseline. Now we need to focus on physical therapy to improve her leg strength. I believe this will help her disequilibrium and help prevent future falls. I will schedule her to receive home health physical therapy.

## 2015-03-25 ENCOUNTER — Telehealth: Payer: Self-pay | Admitting: Family Medicine

## 2015-03-25 NOTE — Telephone Encounter (Signed)
[name] from care [place] calling regarding this patient  Please call her at [phone]

## 2015-03-29 ENCOUNTER — Encounter: Payer: Self-pay | Admitting: Family Medicine

## 2015-03-29 NOTE — Telephone Encounter (Signed)
lmtrc

## 2015-03-30 ENCOUNTER — Other Ambulatory Visit: Payer: Self-pay

## 2015-03-30 MED ORDER — LUBIPROSTONE 24 MCG PO CAPS: 24.0000 ug | ORAL_CAPSULE | Freq: Two times a day (BID) | ORAL | Status: DC

## 2015-05-09 ENCOUNTER — Encounter: Payer: Self-pay | Admitting: Family Medicine

## 2015-05-09 ENCOUNTER — Ambulatory Visit: Payer: PPO | Admitting: Gastroenterology

## 2015-05-09 ENCOUNTER — Telehealth: Payer: Self-pay | Admitting: *Deleted

## 2015-05-09 ENCOUNTER — Ambulatory Visit (HOSPITAL_COMMUNITY)
Admission: RE | Admit: 2015-05-09 | Discharge: 2015-05-09 | Disposition: A | Discharge status: Home / Self Care | Payer: PPO | Source: Ambulatory Visit | Attending: Oncology | Admitting: Oncology

## 2015-05-09 DIAGNOSIS — J439 Emphysema, unspecified: Secondary | ICD-10-CM | POA: Insufficient documentation

## 2015-05-09 DIAGNOSIS — R918 Other nonspecific abnormal finding of lung field: Secondary | ICD-10-CM | POA: Diagnosis not present

## 2015-05-09 DIAGNOSIS — Z08 Encounter for follow-up examination after completed treatment for malignant neoplasm: Secondary | ICD-10-CM | POA: Diagnosis present

## 2015-05-09 DIAGNOSIS — C3490 Malignant neoplasm of unspecified part of unspecified bronchus or lung: Secondary | ICD-10-CM | POA: Diagnosis not present

## 2015-05-09 DIAGNOSIS — Z902 Acquired absence of lung [part of]: Secondary | ICD-10-CM | POA: Insufficient documentation

## 2015-05-09 NOTE — Telephone Encounter (Signed)
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

## 2015-05-11 ENCOUNTER — Encounter (HOSPITAL_COMMUNITY): Payer: PPO | Admitting: Hematology & Oncology

## 2015-05-11 ENCOUNTER — Encounter (HOSPITAL_COMMUNITY): Payer: Self-pay | Admitting: Hematology & Oncology

## 2015-05-11 VITALS — BP 112/74 | HR 70 | Temp 98.2°F | Resp 20 | Wt 138.2 lb

## 2015-05-11 DIAGNOSIS — Z1239 Encounter for other screening for malignant neoplasm of breast: Secondary | ICD-10-CM

## 2015-05-11 IMAGING — CT CT ANGIO NECK
1 of 7 series · 2 of 33 positions shown · IV contrast (Omnipaque 300)
Comparison: MRI 06/25/2013. CT 11/13/2010.

CLINICAL DATA: Vascular lesion of the right neck demonstrated at
MRI. History of lung cancer.

EXAM:
CT ANGIOGRAPHY NECK
TECHNIQUE: Multidetector CT imaging of the neck was performed using the
standard protocol during bolus administration of intravenous
contrast. Multiplanar CT image reconstructions including MIPs were
obtained to evaluate the vascular anatomy. Carotid stenosis
measurements (when applicable) are obtained utilizing NASCET
criteria, using the distal internal carotid diameter as the
denominator.
CONTRAST:  80mL OMNIPAQUE IOHEXOL 350 MG/ML SOLN

[Series 6: cta neck cp portal/pac · axial · portal-venous · 0.34mm/px · z∈[+101,+181]mm · 2 of 121 slices shown]
[im 41/121  soft-tissue]
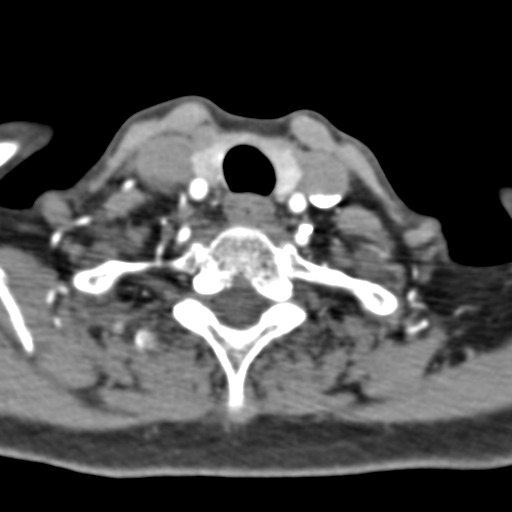
[im 81/121  bone]
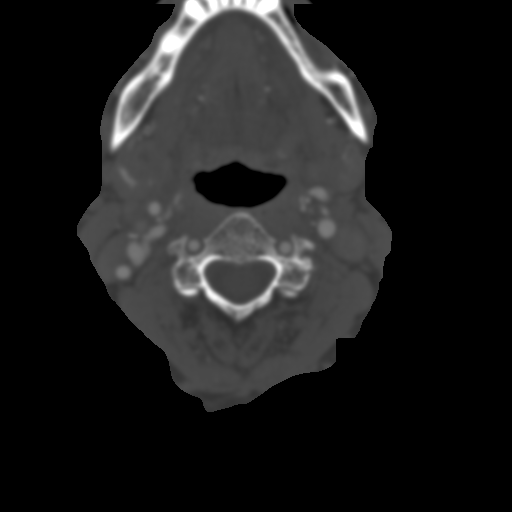

[2 of 33 positions shown; findings below may reference images not displayed]

FINDINGS: The aortic arch appears normal. Branching pattern of the
brachiocephalic vessels from the arch is normal. No origin stenoses.
Lung apices show advanced emphysema.

The right common carotid artery is widely patent to the bifurcation.
No bifurcation stenosis or irregularity. Beginning 3 cm from the
bifurcation, there is a large aneurysm of the internal carotid
artery which measures 2.8 cm cephalocaudal, 2 cm front to back and
2.2 cm right to left. The entire internal carotid artery empties
into this aneurysm. The ICA outflow is from the inferior aspect of
the aneurysm. There is mild aneurysmal dilatation and irregularity
of the vessel extending [DATE] cm from the outflow. Beyond
that, the cervical internal carotid artery is normal. There external
carotid artery is normal.

The left common carotid artery is widely patent to the bifurcation.
There is mild atherosclerotic calcification at the carotid
bifurcation but no stenosis or irregularity. The cervical internal
carotid artery is tortuous beneath the skullbase but there is no
aneurysm or irregularity.

Both vertebral artery origins are widely patent. Both vertebral
arteries are widely patent through the cervical region without
aneurysm or irregularity.

No subclavian stenosis is seen.

Review of the MIP images confirms the above findings.
IMPRESSION: Large saccular aneurysm of the right internal carotid artery
beginning 3 cm distal to the bifurcation. The entire vessel empties
into the aneurysm, with the outflow vessel at the inferior aspect of
the aneurysm. The 1st 1 cm of the outflow portion of the ICA does
show some irregularity and minor aneurysmal dilatation.

## 2015-05-11 NOTE — Progress Notes (Signed)
Ruth Fraction, MD [address] [place] [address]  Breast cancer screening - Plan: MM SCREENING BREAST TOMO BILATERAL, CANCELED: MM Digital Screening  CURRENT THERAPY: Surveillance per NCCN guidelines  INTERVAL HISTORY: [name] 74 y.o. female returns for followup of Stage IB bronchoalveolar carcinoma,. Well-differentiated the left lower lobe status post resection on 12/21/2010. She had 14 lymph nodes negative and no perineural space invasion with clear margins and no LV I. Thus far she has no evidence for recurrent disease or new disease.      Adenocarcinoma of lung   12/21/2010 Initial Diagnosis S/P left lower lobe resection for a well-differentiated Stage IB bronchoalveolar cancer. 0/14 lymph nodes.  No perineural invasion or LVI.  negative margins.    Oncologically, she denies any complaints and ROS questioning is negative.   She has no complaints today. Last CT of the chest was on August 1 of this year. He is here today to review the results. She denies any change in her breathing. She denies any new pain. He denies any significant change in her appetite or energy level.  Past Medical History  Diagnosis Date  . COPD (chronic obstructive pulmonary disease)   . Anxiety disorder   . Hypothyroidism   . PUD (peptic ulcer disease)     19yr  . Dysphagia   . IBS (irritable bowel syndrome)   . Diverticulosis   . Trigeminal neuralgia   . Diastolic dysfunction   . Allergic rhinitis   . 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 Hiatal hernia   . Aortic aneurysm   . Complication of anesthesia 2011    bloodpressure dropped during colonoscopy, not problems since  . Shortness of breath     with exertion  . Aneurysm of carotid artery     corrected by surgery 08/21/13  . Compression fracture   . On home O2 12/15/2013    chronic hypoxia  . Depression   . Emphysema lung 11/08/2014    has CONTRAST DYE ALLERGY; Epigastric pain;  Esophageal dysphagia; Trigeminal neuralgia; Adenocarcinoma of lung; Dyspepsia; Aneurysm; Abdominal aneurysm without mention of rupture; Aneurysm of neck; Hypoxia; Diastolic dysfunction; Compression fracture; Constipation; Acute respiratory failure with hypoxia; Hyponatremia; COPD (chronic obstructive pulmonary disease); Compression fracture of thoracic vertebra; T12 burst fracture; Occlusion and stenosis of carotid artery without mention of cerebral infarction; Wrist fracture; Acute on chronic respiratory failure with hypoxia; Helicobacter pylori gastritis; Emphysema lung; Change in bowel habits; Abdominal pain, left lower quadrant; and HLD (hyperlipidemia) on her problem list.     is allergic to ciprofloxacin hcl; iohexol; prozac; and sulfonamide derivatives.  [name] not currently have medications on file.  Past Surgical History  Procedure Laterality Date  . 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 Esophagogastroduodenoscopy  11/13/2009    w/dilation to 135m gastric ulceration (H.Pylori) s/p treatment  . Esophagogastroduodenoscopy  11/21/2009    distal esophageal web, gastritis  . Lung cancer surgery  12/2010    Left VATS, minithoracotomy, LLL superior segmentectomy  . Vocal cord surgery  02/06/2011    laryngoscopy with bilateral vocal cord Radiesse injection for vocal cord paralysis  . Abdominal hysterectomy    . Cholecystectomy    . Tubal ligation    . Cataract extraction    . Fatty tumor removal from lt groin    . Cataract extraction w/phaco  09/02/2012    Procedure:  CATARACT EXTRACTION PHACO AND INTRAOCULAR LENS PLACEMENT (IOC);  Surgeon: Elta Guadeloupe T. Gershon Crane, MD;  Location: [facility];  Service: Ophthalmology;  Laterality: Left;  CDE:10.35  . Endarterectomy Right 08/21/2013    Procedure: RIGHT CAROTID ANEURYSM RESECTION;  Surgeon: Rosetta Posner, MD;  Location: [facility] OR;  Service: Vascular;  Laterality: Right;  . Fracture surgery Left     hand and left ring  finger  . Orif wrist fracture Left 04/09/2014    Procedure: OPEN REDUCTION INTERNAL FIXATION (ORIF) WRIST FRACTURE;  Surgeon: Renette Butters, MD;  Location: [place];  Service: Orthopedics;  Laterality: Left;  . Percutaneous pinning Left 04/09/2014    Procedure: PERCUTANEOUS PINNING EXTREMITY;  Surgeon: Renette Butters, MD;  Location: [place];  Service: Orthopedics;  Laterality: Left;  . Esophagogastroduodenoscopy  03/14/12    [id number] in the distal esophagus/Mild gastritis/small HH. + H.pylori, prescribed Pylera. Finished treatment.   . Back surgery  January 13, 2014  . Colonoscopy N/A 11/23/2014    Procedure: COLONOSCOPY;  Surgeon: Danie Binder, MD;  [facility];  Service: Endoscopy;  Laterality: N/A;  930     Denies any headaches, dizziness, double vision, fevers, chills, night sweats, nausea, vomiting, diarrhea, constipation, chest pain, heart palpitations, shortness of breath, blood in stool, black tarry stool, urinary pain, urinary burning, urinary frequency, hematuria. 14 point review of systems was performed and is negative except as detailed under history of present illness and above    PHYSICAL EXAMINATION  ECOG PERFORMANCE STATUS: 1 - Symptomatic but completely ambulatory  Filed Vitals:   05/11/15 1100  BP: 112/74  Pulse: 70  Temp: 98.2 F (36.8 C)  Resp: 20    GENERAL:alert, no distress, well nourished, well developed, comfortable, cooperative and smiling, [place] in place giving O2. SKIN: skin color, texture, turgor are normal, no rashes or significant lesions HEAD: Normocephalic, No masses, lesions, tenderness or abnormalities EYES: normal, PERRLA, EOMI, Conjunctiva are pink and non-injected EARS: External ears normal OROPHARYNX:lips, buccal mucosa, and tongue normal and mucous membranes are moist  NECK: supple, thyroid normal size, non-tender, without nodularity, no stridor, non-tender, trachea midline LYMPH:  No palpable adenopathy in the neck or  supraclavicular regions LUNGS: clear to auscultation  HEART: regular rate & rhythm, no murmurs and no gallops ABDOMEN:abdomen soft and normal bowel sounds BACK: Back symmetric, no curvature. EXTREMITIES:less then 2 second capillary refill, no joint deformities, effusion, or inflammation, no cyanosis  NEURO: alert & oriented x 3 with fluent speech, no focal motor/sensory deficits, gait normal with a rolling walker for stability.   LABORATORY DATA: CBC    Component Value Date/Time   WBC 5.6 06/01/2015 0823   RBC 4.40 06/01/2015 0823   HGB 12.9 06/01/2015 0823   HCT 37.6 06/01/2015 0823   PLT 289 06/01/2015 0823   MCV 85.5 06/01/2015 0823   MCH 29.3 06/01/2015 0823   MCHC 34.3 06/01/2015 0823   RDW 12.4 06/01/2015 0823   LYMPHSABS 1.4 10/25/2014 1127   MONOABS 0.5 10/25/2014 1127   EOSABS 0.3 10/25/2014 1127   BASOSABS 0.1 10/25/2014 1127      Chemistry      Component Value Date/Time   NA 132* 06/14/2015 1140   K 5.1 06/14/2015 1140   CL 94* 06/14/2015 1140   CO2 30 06/14/2015 1140   BUN 11 06/14/2015 1140   CREATININE 0.49* 06/14/2015 1140   CREATININE 0.65 06/01/2015 0823      Component Value Date/Time   CALCIUM 10.2 06/14/2015 1140   ALKPHOS  91 10/25/2014 1127   AST 16 10/25/2014 1127   ALT 11 10/25/2014 1127   BILITOT 0.6 10/25/2014 1127       RADIOGRAPHIC STUDIES:  CLINICAL DATA: 74 year old female former smoker with a history of lung cancer (left lower lobe adenocarcinoma) diagnosed in 2012, presenting for restaging. Nodular pleural parenchymal thickening at the left lung base on prior CT study.  EXAM: CT CHEST WITHOUT CONTRAST  TECHNIQUE: Multidetector CT imaging of the chest was performed following the standard protocol without IV contrast.  COMPARISON: Most recent chest CT from 10/28/2014. Chest radiograph from 03/14/2015.  IMPRESSION: 1. Status post left lower lobe wedge resection. Benign scarring in the medial/basilar left lower lobe.  No evidence of local tumor recurrence or metastatic disease in the chest. 2. Irregular 0.5 cm superior segment right lower lobe pulmonary nodule, stable since at least 10/31/2012. 3. Moderate to severe centrilobular emphysema.   Electronically Signed  By: Ilona Sorrel M.D.  On: 05/09/2015 12:19   ASSESSMENT AND PLAN:  Stage IB bronchoalveolar carcinoma,. Well-differentiated the left lower lobe status post resection on 12/21/2010. Former smoker Centrilobular emphysema   I reviewed her CT imaging with her. I advised her that there is no evidence of recurrent disease. She is aware that she has COPD. I have recommended return for ongoing observation and physical exam in 6 months to which she is agreeable.  NCCN guidelines for Non-Small Cell Lung Cancer Surveillance are as follows:  A. H+P every 6-12 months and CT of chest every 6 months for the first two years  B. Low-dose spiral CT chest annually after first two years of surveillance CTs   C. PET scan not typically indicated for surveillance  D. Smoking cessation advice, counseling, and pharmacotherapy   All questions were answered. The patient knows to call the clinic with any problems, questions or concerns. We can certainly see the patient much sooner if necessary. //  This document serves as a record of services personally performed by Ancil Linsey, MD. It was created on her behalf by Janace Hoard, a trained medical scribe. The creation of this record is based on the scribe's personal observations and the provider's statements to them. This document has been checked and approved by the attending provider.  I have reviewed the above documentation for accuracy and completeness, and I agree with the above.  This note was electronically signed.  Kelby Fam. Whitney Muse, MD

## 2015-05-11 NOTE — Patient Instructions (Signed)
..  [place] at [facility] Discharge Instructions  RECOMMENDATIONS MADE BY THE CONSULTANT AND ANY TEST RESULTS WILL BE SENT TO YOUR REFERRING PHYSICIAN.  Seen and discussion with Dr. Whitney Muse. Call the cancer center with any questions and/or concerns that you have.  Follow up visit in 6 months.    Thank you for choosing [place] at [facility] to provide your oncology and hematology care.  To afford each patient quality time with our provider, please arrive at least 15 minutes before your scheduled appointment time.    You need to re-schedule your appointment should you arrive 10 or more minutes late.  We strive to give you quality time with our providers, and arriving late affects you and other patients whose appointments are after yours.  Also, if you no show three or more times for appointments you may be dismissed from the clinic at the providers discretion.     Again, thank you for choosing [facility].  Our hope is that these requests will decrease the amount of time that you wait before being seen by our physicians.       _____________________________________________________________  Should you have questions after your visit to [facility], please contact our office at [phone] between the hours of 8:30 a.m. and 4:30 p.m.  Voicemails left after 4:30 p.m. will not be returned until the following business day.  For prescription refill requests, have your pharmacy contact our office.

## 2015-05-16 ENCOUNTER — Ambulatory Visit: Payer: PPO | Admitting: Gastroenterology

## 2015-06-01 ENCOUNTER — Emergency Department (HOSPITAL_COMMUNITY)
Admission: EM | Admit: 2015-06-01 | Discharge: 2015-06-01 | Disposition: A | Discharge status: Home / Self Care | Payer: PPO | Attending: Emergency Medicine | Admitting: Emergency Medicine

## 2015-06-01 ENCOUNTER — Emergency Department (HOSPITAL_COMMUNITY): Payer: PPO

## 2015-06-01 ENCOUNTER — Encounter (HOSPITAL_COMMUNITY): Payer: Self-pay | Admitting: Emergency Medicine

## 2015-06-01 DIAGNOSIS — Z85118 Personal history of other malignant neoplasm of bronchus and lung: Secondary | ICD-10-CM | POA: Diagnosis not present

## 2015-06-01 DIAGNOSIS — Z8719 Personal history of other diseases of the digestive system: Secondary | ICD-10-CM | POA: Diagnosis not present

## 2015-06-01 DIAGNOSIS — F329 Major depressive disorder, single episode, unspecified: Secondary | ICD-10-CM | POA: Diagnosis not present

## 2015-06-01 DIAGNOSIS — R110 Nausea: Secondary | ICD-10-CM | POA: Diagnosis not present

## 2015-06-01 DIAGNOSIS — R531 Weakness: Secondary | ICD-10-CM | POA: Diagnosis not present

## 2015-06-01 DIAGNOSIS — E039 Hypothyroidism, unspecified: Secondary | ICD-10-CM | POA: Insufficient documentation

## 2015-06-01 DIAGNOSIS — R55 Syncope and collapse: Secondary | ICD-10-CM | POA: Insufficient documentation

## 2015-06-01 DIAGNOSIS — Z8781 Personal history of (healed) traumatic fracture: Secondary | ICD-10-CM | POA: Insufficient documentation

## 2015-06-01 DIAGNOSIS — Z79899 Other long term (current) drug therapy: Secondary | ICD-10-CM | POA: Insufficient documentation

## 2015-06-01 DIAGNOSIS — F419 Anxiety disorder, unspecified: Secondary | ICD-10-CM | POA: Insufficient documentation

## 2015-06-01 DIAGNOSIS — Z8679 Personal history of other diseases of the circulatory system: Secondary | ICD-10-CM | POA: Insufficient documentation

## 2015-06-01 DIAGNOSIS — Z87891 Personal history of nicotine dependence: Secondary | ICD-10-CM | POA: Diagnosis not present

## 2015-06-01 DIAGNOSIS — J449 Chronic obstructive pulmonary disease, unspecified: Secondary | ICD-10-CM | POA: Insufficient documentation

## 2015-06-01 DIAGNOSIS — Z9981 Dependence on supplemental oxygen: Secondary | ICD-10-CM | POA: Insufficient documentation

## 2015-06-01 LAB — CBC
HCT: 37.6 % (ref 36.0–46.0)
Hemoglobin: 12.9 g/dL (ref 12.0–15.0)
MCH: 29.3 pg (ref 26.0–34.0)
MCHC: 34.3 g/dL (ref 30.0–36.0)
MCV: 85.5 fL (ref 78.0–100.0)
Platelets: 289 K/uL (ref 150–400)
RBC: 4.4 MIL/uL (ref 3.87–5.11)
RDW: 12.4 % (ref 11.5–15.5)
WBC: 5.6 K/uL (ref 4.0–10.5)

## 2015-06-01 LAB — URINALYSIS, ROUTINE W REFLEX MICROSCOPIC
Bilirubin Urine: NEGATIVE
Glucose, UA: NEGATIVE mg/dL
Hgb urine dipstick: NEGATIVE
Ketones, ur: NEGATIVE mg/dL
Leukocytes, UA: NEGATIVE
Nitrite: NEGATIVE
Protein, ur: NEGATIVE mg/dL
Specific Gravity, Urine: 1.015 (ref 1.005–1.030)
Urobilinogen, UA: 0.2 mg/dL (ref 0.0–1.0)
pH: 5.5 (ref 5.0–8.0)

## 2015-06-01 LAB — BASIC METABOLIC PANEL
Anion gap: 5 (ref 5–15)
BUN: 9 mg/dL (ref 6–20)
CO2: 30 mmol/L (ref 22–32)
Calcium: 9.6 mg/dL (ref 8.9–10.3)
Chloride: 91 mmol/L — ABNORMAL LOW (ref 101–111)
Creatinine, Ser: 0.65 mg/dL (ref 0.44–1.00)
GFR calc Af Amer: >60 mL/min (ref >60)
GFR calc non Af Amer: >60 mL/min (ref >60)
Glucose, Bld: 126 mg/dL — ABNORMAL HIGH (ref 65–99)
Potassium: 4.8 mmol/L (ref 3.5–5.1)
Sodium: 126 mmol/L — ABNORMAL LOW (ref 135–145)

## 2015-06-01 LAB — TROPONIN I: Troponin I: <0.03 ng/mL

## 2015-06-01 LAB — TSH: TSH: 4.232 u[IU]/mL (ref 0.350–4.500)

## 2015-06-01 LAB — T4, FREE: Free T4: 0.97 ng/dL (ref 0.61–1.12)

## 2015-06-01 MED ORDER — SODIUM CHLORIDE 0.9 % IV BOLUS (SEPSIS)
1000.0000 mL | Freq: Once | INTRAVENOUS | Status: AC
  Administered 2015-06-01: 1000 mL via INTRAVENOUS

## 2015-06-01 NOTE — ED Notes (Signed)
Patient arrives via EMS from home with c/o near syncope at home. Patient was up to restroom when she suddenly felt dizzy and sat on commode and could not get back up. Palpated BP in 61P systolic. Patient alert/oriented x 4.

## 2015-06-01 NOTE — ED Provider Notes (Signed)
CSN: [id number]     Arrival date & time 06/01/15  0806 History  This chart was scribed for Carmin Muskrat, MD by Terressa Koyanagi, ED Scribe. This patient was seen in room APA01/APA01 and the patient's care was started at 8:41 AM.   Chief Complaint  Patient presents with  . Near Syncope   The history is provided by the patient. No language interpreter was used.   PCP: [name], MD HPI Comments: [name] is a 74 y.o. female brought in by ambulance, with PMHx noted below including stage III COPD, aortic aneurysm, chronic hypoxia and on 2L of O2 at home, emphysema lung, and ambualate with walker since 01/2015, who presents to the Emergency Department complaining of near syncope with associated lightheadedness and nausea onset this morning. Pt specifies the chronology of events: (1) pt was watching television when she began to feel nauseas; (2) pt then went to the restroom and began to feel light headed; (3) thereafter, pt sat down on the commode and was unable to get up from the commode. Pt reports having a bowel movement while in the commode and denies any blood in stool. Pt further complains of recurrent weakness over the past few weeks. Pt denies chest pain, vomiting, any new vision changes (pt reports vision changes since April 2016 after pt suffered a fall which resulted in head trauma), fever, or any other Sx at this time.   Past Medical History  Diagnosis Date  . COPD (chronic obstructive pulmonary disease)   . Anxiety disorder   . Hypothyroidism   . PUD (peptic ulcer disease)     16yr  . Dysphagia   . IBS (irritable bowel syndrome)   . Diverticulosis   . Trigeminal neuralgia   . Diastolic dysfunction   . Allergic rhinitis   . 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 Hiatal hernia   . Aortic aneurysm   . Complication of anesthesia 2011    bloodpressure dropped during colonoscopy, not problems since  . Shortness of breath     with exertion  . Aneurysm  of carotid artery     corrected by surgery 08/21/13  . Compression fracture   . On home O2 12/15/2013    chronic hypoxia  . Depression   . Emphysema lung 11/08/2014   Past Surgical History  Procedure Laterality Date  . 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 Esophagogastroduodenoscopy  11/13/2009    w/dilation to 175m gastric ulceration (H.Pylori) s/p treatment  . Esophagogastroduodenoscopy  11/21/2009    distal esophageal web, gastritis  . Lung cancer surgery  12/2010    Left VATS, minithoracotomy, LLL superior segmentectomy  . Vocal cord surgery  02/06/2011    laryngoscopy with bilateral vocal cord Radiesse injection for vocal cord paralysis  . Abdominal hysterectomy    . Cholecystectomy    . Tubal ligation    . Cataract extraction    . Fatty tumor removal from lt groin    . Cataract extraction w/phaco  09/02/2012    Procedure: CATARACT EXTRACTION PHACO AND INTRAOCULAR LENS PLACEMENT (IOC);  Surgeon: MaElta Guadeloupe. ShGershon CraneMD;  Location: [facility];  Service: Ophthalmology;  Laterality: Left;  CDE:10.35  . Endarterectomy Right 08/21/2013    Procedure: RIGHT CAROTID ANEURYSM RESECTION;  Surgeon: ToRosetta PosnerMD;  [facility];  Service: Vascular;  Laterality: Right;  . Fracture surgery Left     hand and left ring finger  .  Orif wrist fracture Left 04/09/2014    Procedure: OPEN REDUCTION INTERNAL FIXATION (ORIF) WRIST FRACTURE;  Surgeon: Renette Butters, MD;  Location: [place];  Service: Orthopedics;  Laterality: Left;  . Percutaneous pinning Left 04/09/2014    Procedure: PERCUTANEOUS PINNING EXTREMITY;  Surgeon: Renette Butters, MD;  Location: [place];  Service: Orthopedics;  Laterality: Left;  . Esophagogastroduodenoscopy  03/14/12    LSL:[id number] in the distal esophagus/Mild gastritis/small HH. + H.pylori, prescribed Pylera. Finished treatment.   . Back surgery  January 13, 2014  . Colonoscopy N/A 11/23/2014    Procedure: COLONOSCOPY;  Surgeon: Danie Binder,  MD;  [facility];  Service: Endoscopy;  Laterality: N/A;  930    Family History  Problem Relation Age of Onset  . Liver cancer Sister   . Cancer Sister   . Pulmonary embolism Mother   . Heart attack Father   . Hypertension Father   . Cancer Brother   . Cancer Daughter   . Colon cancer Neg Hx    Social History  Substance Use Topics  . Smoking status: Former Smoker -- 0.50 packs/day for 20 years    Types: Cigarettes    Quit date: 12/21/2010  . Smokeless tobacco: Never Used     Comment: smoking cessation info given and reviewed   . Alcohol Use: 0.0 oz/week    0 Standard drinks or equivalent per week     Comment: seldom   OB History    Gravida Para Term Preterm AB TAB SAB Ectopic Multiple Living   '2 2 2       2     '$ Review of Systems  Constitutional: Negative for fever.       Per HPI, otherwise negative  HENT:       Per HPI, otherwise negative  Eyes: Negative for visual disturbance.  Respiratory:       Per HPI, otherwise negative  Cardiovascular: Negative for chest pain.       Per HPI, otherwise negative  Gastrointestinal: Positive for nausea. Negative for vomiting and blood in stool.  Endocrine:       Negative aside from HPI  Genitourinary:       Neg aside from HPI   Musculoskeletal:       Per HPI, otherwise negative  Skin: Negative.   Neurological: Positive for weakness and light-headedness. Negative for syncope (near syncope ).   Allergies  Ciprofloxacin hcl; Iohexol; Prozac; and Sulfonamide derivatives  Home Medications   Prior to Admission medications   Medication Sig Start Date End Date Taking? Authorizing Provider  calcium citrate-vitamin D (CITRACAL+D) 315-200 MG-UNIT per tablet Take 1 tablet by mouth daily.     Historical Provider, MD  Cholecalciferol (VITAMIN D-3) 5000 UNITS TABS Take 5,000 Units by mouth daily.     Historical Provider, MD  citalopram (CELEXA) 40 MG tablet Take 1 tablet (40 mg total) by mouth at bedtime. 12/06/14   [name], MD  cyclobenzaprine (FLEXERIL) 10 MG tablet Take 1 tablet (10 mg total) by mouth every 8 (eight) hours as needed for muscle spasms. Patient not taking: Reported on 05/11/2015 01/28/15   [name], MD  diphenhydrAMINE (BENADRYL) 50 MG tablet Take 1 tablet (50 mg total) by mouth at bedtime as needed for itching. Take 1 tablet 1 hour prior to study 11/15/14   Danie Binder, MD  fish oil-omega-3 fatty acids 1000 MG capsule Take 1 g by mouth daily.     Historical Provider, MD  hyoscyamine (LEVSIN, ANASPAZ) 0.125 MG tablet Take 1 tablet (0.125 mg total) by mouth every 6 (six) hours as needed. 01/25/15   [name], MD  ibuprofen (ADVIL,MOTRIN) 200 MG tablet Take 400 mg by mouth every 6 (six) hours as needed (for pain).    Historical Provider, MD  levothyroxine (SYNTHROID, LEVOTHROID) 25 MCG tablet Take 1 tablet (25 mcg total) by mouth daily before breakfast. 12/06/14   [name], MD  lubiprostone (AMITIZA) 24 MCG capsule Take 1 capsule (24 mcg total) by mouth 2 (two) times daily with a meal. 03/30/15   Carlis Stable, NP  Multiple Vitamin (MULTIVITAMIN WITH MINERALS) TABS Take 1 tablet by mouth daily.    Historical Provider, MD  Oxcarbazepine (TRILEPTAL) 300 MG tablet Take 1 tablet (300 mg total) by mouth 2 (two) times daily. 12/20/14   [name], MD  predniSONE (DELTASONE) 20 MG tablet Take 2 tablets (40 mg total) by mouth daily with breakfast. X 7 days Patient not taking: Reported on 05/11/2015 03/15/15   [name], MD  Tiotropium Bromide-Olodaterol (STIOLTO RESPIMAT) 2.5-2.5 MCG/ACT AERS Inhale 2 Inhalers into the lungs daily. 02/08/15   [name], MD  traMADol (ULTRAM) 50 MG tablet Take 1 tablet (50 mg total) by mouth every 8 (eight) hours as needed. Patient not taking: Reported on 05/11/2015 03/04/15   [name], MD   Triage Vitals: BP 102/34 mmHg  Pulse 63  Temp(Src) 97.4 F (36.3 C) (Oral)  Resp 18  Ht '5\' 6"'$  (1.676 m)  Wt 133 lb (60.328 kg)  BMI 21.48  kg/m2  SpO2 93% Physical Exam  Constitutional: She is oriented to person, place, and time. She appears well-developed and well-nourished. No distress.  HENT:  Head: Normocephalic and atraumatic.  Eyes: Conjunctivae and EOM are normal.  Cardiovascular: Normal rate and regular rhythm.   Pulmonary/Chest: Effort normal. No stridor. No respiratory distress.  Clear but diminished breath sounds.   Abdominal: She exhibits no distension.  Musculoskeletal: She exhibits no edema.  Neurological: She is alert and oriented to person, place, and time. No cranial nerve deficit.  Patient speaks in full goal oriented sentences. DTRs normal and symmetric. Equal grip strength bilateral with 5/5 strength against resistance in upper and lower extremities. No sensory or motor deficits appreciated.   Skin: Skin is warm and dry.  Psychiatric: She has a normal mood and affect.  Nursing note and vitals reviewed.   ED Course  Procedures (including critical care time) COORDINATION OF CARE: 8:47 AM: Discussed treatment plan which includes fluids, labs, with pt at bedside; patient verbalizes understanding and agrees with treatment plan.  Patient hypotensive, 86/52, IV fluids started  Results for orders placed or performed during the hospital encounter of 06/01/15  CBC  Result Value Ref Range   WBC 5.6 4.0 - 10.5 K/uL   RBC 4.40 3.87 - 5.11 MIL/uL   Hemoglobin 12.9 12.0 - 15.0 g/dL   HCT 37.6 36.0 - 46.0 %   MCV 85.5 78.0 - 100.0 fL   MCH 29.3 26.0 - 34.0 pg   MCHC 34.3 30.0 - 36.0 g/dL   RDW 12.4 11.5 - 15.5 %   Platelets 289 150 - 400 K/uL    1:20 PM Patient awake and alert, sitting upright, eating. Blood pressure 329 systolic. We discussed all findings at length  MDM  Patient presents after an episode of near syncope, in the context of generalized weakness that has been persistent for some time. Patient's multiple Corbitt's, including  COPD, decreased in her capacity at baseline. Patient had no  evidence for trauma here, no evidence for infection, and lung sounds were clear, though diminished. Patient's evaluation was largely reassuring, and she improved substantially with IV fluids. There is some suspicion for dehydration as contributory, with no other evidence for acute systemic pathology. Given her improvement, she was discharged in stable condition to primary care follow-up  I personally performed the services described in this documentation, which was scribed in my presence. The recorded information has been reviewed and is accurate.    Carmin Muskrat, MD 06/01/15 1322

## 2015-06-01 NOTE — ED Notes (Signed)
Pt ambulated with 1 person assist. States she uses a walker at home.

## 2015-06-01 NOTE — Discharge Instructions (Signed)
As discussed, your evaluation today has been largely reassuring.  But, it is important that you monitor your condition carefully, and do not hesitate to return to the ED if you develop new, or concerning changes in your condition.  Otherwise, please follow-up with your physician for appropriate ongoing care.   Near-Syncope Near-syncope (commonly known as near fainting) is sudden weakness, dizziness, or feeling like you might pass out. During an episode of near-syncope, you may also develop pale skin, have tunnel vision, or feel sick to your stomach (nauseous). Near-syncope may occur when getting up after sitting or while standing for a long time. It is caused by a sudden decrease in blood flow to the brain. This decrease can result from various causes or triggers, most of which are not serious. However, because near-syncope can sometimes be a sign of something serious, a medical evaluation is required. The specific cause is often not determined. HOME CARE INSTRUCTIONS  Monitor your condition for any changes. The following actions may help to alleviate any discomfort you are experiencing:  Have someone stay with you until you feel stable.  Lie down right away and prop your feet up if you start feeling like you might faint. Breathe deeply and steadily. Wait until all the symptoms have passed. Most of these episodes last only a few minutes. You may feel tired for several hours.   Drink enough fluids to keep your urine clear or pale yellow.   If you are taking blood pressure or heart medicine, get up slowly when seated or lying down. Take several minutes to sit and then stand. This can reduce dizziness.  Follow up with your health care provider as directed. SEEK IMMEDIATE MEDICAL CARE IF:   You have a severe headache.   You have unusual pain in the chest, abdomen, or back.   You are bleeding from the mouth or rectum, or you have black or tarry stool.   You have an irregular or very fast  heartbeat.   You have repeated fainting or have seizure-like jerking during an episode.   You faint when sitting or lying down.   You have confusion.   You have difficulty walking.   You have severe weakness.   You have vision problems.  MAKE SURE YOU:   Understand these instructions.  Will watch your condition.  Will get help right away if you are not doing well or get worse. Document Released: 09/24/2005 Document Revised: 09/29/2013 Document Reviewed: 02/27/2013 [facility] Patient Information 2015 [place], [place]. This information is not intended to replace advice given to you by your health care provider. Make sure you discuss any questions you have with your health care provider.

## 2015-06-03 ENCOUNTER — Ambulatory Visit (INDEPENDENT_AMBULATORY_CARE_PROVIDER_SITE_OTHER): Payer: PPO | Admitting: Family Medicine

## 2015-06-03 ENCOUNTER — Encounter: Payer: Self-pay | Admitting: Family Medicine

## 2015-06-03 VITALS — BP 110/72 | HR 68 | Temp 97.9°F | Resp 18 | Ht 66.0 in | Wt 140.0 lb

## 2015-06-03 DIAGNOSIS — E871 Hypo-osmolality and hyponatremia: Secondary | ICD-10-CM | POA: Diagnosis not present

## 2015-06-03 LAB — BASIC METABOLIC PANEL WITH GFR
BUN: 9 mg/dL (ref 7–25)
CO2: 27 mmol/L (ref 20–31)
Calcium: 9.5 mg/dL (ref 8.6–10.4)
Chloride: 90 mmol/L — ABNORMAL LOW (ref 98–110)
Creat: 0.51 mg/dL — ABNORMAL LOW (ref 0.60–0.93)
GFR, Est African American: >89 mL/min (ref >=60)
GFR, Est Non African American: >89 mL/min (ref >=60)
Glucose, Bld: 81 mg/dL (ref 70–99)
Potassium: 4.7 mmol/L (ref 3.5–5.3)
Sodium: 127 mmol/L — ABNORMAL LOW (ref 135–146)

## 2015-06-03 MED ORDER — SODIUM BICARBONATE 650 MG PO TABS: 650.0000 mg | ORAL_TABLET | Freq: Two times a day (BID) | ORAL | Status: DC

## 2015-06-03 NOTE — Progress Notes (Signed)
Subjective:    Patient ID: [name], female    DOB: [birth year], 74 y.o.   MRN: [id number]  HPI Patient went to the emergency room earlier this week. She states that while she was in the bathroom, she became extremely weak and lightheaded and dizzy like she was going to pass out and they called EMS and per the patient's report her systolic blood pressure was only in the 50s. She was taken to the hospital. The first documented blood pressure I can find in the hospital, her systolic blood pressure was in the 82X with her diastolic blood pressure in the 50s. She was given IV fluids and her blood pressure improved. However she was found also to be hyponatremic with a sodium level of 126. Her dizziness and presyncope was attributed to dehydration and she was discharged home. She is here today for follow-up. Patient states she continues to feel dizzy. She denies any presyncope but mainly complains of dizziness and weakness. Her sodium levels have always been in the 135 range previously. Past Medical History  Diagnosis Date  . COPD (chronic obstructive pulmonary disease)   . Anxiety disorder   . Hypothyroidism   . PUD (peptic ulcer disease)     49yr  . Dysphagia   . IBS (irritable bowel syndrome)   . Diverticulosis   . Trigeminal neuralgia   . Diastolic dysfunction   . Allergic rhinitis   . 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 Hiatal hernia   . Aortic aneurysm   . Complication of anesthesia 2011    bloodpressure dropped during colonoscopy, not problems since  . Shortness of breath     with exertion  . Aneurysm of carotid artery     corrected by surgery 08/21/13  . Compression fracture   . On home O2 12/15/2013    chronic hypoxia  . Depression   . Emphysema lung 11/08/2014   Past Surgical History  Procedure Laterality Date  . 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 Esophagogastroduodenoscopy  11/13/2009    w/dilation to 114m  gastric ulceration (H.Pylori) s/p treatment  . Esophagogastroduodenoscopy  11/21/2009    distal esophageal web, gastritis  . Lung cancer surgery  12/2010    Left VATS, minithoracotomy, LLL superior segmentectomy  . Vocal cord surgery  02/06/2011    laryngoscopy with bilateral vocal cord Radiesse injection for vocal cord paralysis  . Abdominal hysterectomy    . Cholecystectomy    . Tubal ligation    . Cataract extraction    . Fatty tumor removal from lt groin    . Cataract extraction w/phaco  09/02/2012    Procedure: CATARACT EXTRACTION PHACO AND INTRAOCULAR LENS PLACEMENT (IOC);  Surgeon: MaElta Guadeloupe. ShGershon CraneMD;  [facility]: [facility];  Service: Ophthalmology;  Laterality: Left;  CDE:10.35  . Endarterectomy Right 08/21/2013    Procedure: RIGHT CAROTID ANEURYSM RESECTION;  Surgeon: ToRosetta PosnerMD;  [facility];  Service: Vascular;  Laterality: Right;  . Fracture surgery Left     hand and left ring finger  . Orif wrist fracture Left 04/09/2014    Procedure: OPEN REDUCTION INTERNAL FIXATION (ORIF) WRIST FRACTURE;  Surgeon: TiRenette ButtersMD;  [facility]: [facility];  Laterality: Left;  . Percutaneous pinning Left 04/09/2014    Procedure: PERCUTANEOUS PINNING EXTREMITY;  Surgeon: TiRenette ButtersMD;  [facility]: [facility];  Laterality: Left;  .  Esophagogastroduodenoscopy  03/14/12    ZCH:[id number] in the distal esophagus/Mild gastritis/small HH. + H.pylori, prescribed Pylera. Finished treatment.   . Back surgery  January 13, 2014  . Colonoscopy N/A 11/23/2014    Procedure: COLONOSCOPY;  Surgeon: Danie Binder, MD;  [facility];  Service: Endoscopy;  Laterality: N/A;  930    Current Outpatient Prescriptions on File Prior to Visit  Medication Sig Dispense Refill  . calcium citrate-vitamin D (CITRACAL+D) 315-200 MG-UNIT per tablet Take 1 tablet by mouth daily.     . citalopram (CELEXA) 40 MG tablet Take 1 tablet (40 mg total) by mouth at bedtime. 90 tablet  4  . dextromethorphan-guaiFENesin (MUCINEX DM) 30-600 MG per 12 hr tablet Take 1 tablet by mouth 2 (two) times daily as needed for cough.    . Flaxseed, Linseed, (FLAXSEED OIL) OIL Take 1 capsule by mouth daily.    . hyoscyamine (LEVSIN, ANASPAZ) 0.125 MG tablet Take 1 tablet (0.125 mg total) by mouth every 6 (six) hours as needed. (Patient taking differently: Take 0.125 mg by mouth every 6 (six) hours as needed (stomach). ) 120 tablet 5  . ibuprofen (ADVIL,MOTRIN) 200 MG tablet Take 400 mg by mouth every 6 (six) hours as needed (for pain).    Marland Kitchen lactobacillus acidophilus (BACID) TABS tablet Take 1 tablet by mouth daily.    Marland Kitchen levothyroxine (SYNTHROID, LEVOTHROID) 25 MCG tablet Take 1 tablet (25 mcg total) by mouth daily before breakfast. 90 tablet 4  . loperamide (IMODIUM) 2 MG capsule Take 2 mg by mouth as needed for diarrhea or loose stools.    . lubiprostone (AMITIZA) 24 MCG capsule Take 1 capsule (24 mcg total) by mouth 2 (two) times daily with a meal. 60 capsule 3  . Methenamine-Sodium Salicylate (CYSTEX PO) Take 1 tablet by mouth daily as needed (UTI).     . Multiple Vitamin (MULTIVITAMIN WITH MINERALS) TABS Take 1 tablet by mouth daily.    Marland Kitchen omeprazole (PRILOSEC) 40 MG capsule Take 40 mg by mouth daily.    . Oxcarbazepine (TRILEPTAL) 300 MG tablet Take 1 tablet (300 mg total) by mouth 2 (two) times daily. 180 tablet 3  . Tiotropium Bromide-Olodaterol (STIOLTO RESPIMAT) 2.5-2.5 MCG/ACT AERS Inhale 2 Inhalers into the lungs daily. 1 Inhaler 5   No current facility-administered medications on file prior to visit.   Allergies  Allergen Reactions  . Ciprofloxacin Hcl Hives  . Iohexol      Desc: Per [facility], pt is allergic to IV contrast, no type of reaction was available. Pt cannot remember but first reacted around 15 yrs ago from an IVP. Notes were date from 2009 at urology center., Onset Date: [id number]   . Prozac [Fluoxetine Hcl] Rash  . Sulfonamide Derivatives Hives and Rash    Social History   Social History  . Marital Status: Divorced    Spouse Name: N/A  . Number of Children: N/A  . Years of Education: N/A   Occupational History  . Not on file.   Social History Main Topics  . Smoking status: Former Smoker -- 0.50 packs/day for 20 years    Types: Cigarettes    Quit date: 12/21/2010  . Smokeless tobacco: Never Used     Comment: smoking cessation info given and reviewed   . Alcohol Use: 0.0 oz/week    0 Standard drinks or equivalent per week     Comment: seldom  . Drug Use: No  . Sexual Activity: Yes    Birth Control/ Protection:  Surgical   Other Topics Concern  . Not on file   Social History Narrative      Review of Systems  All other systems reviewed and are negative.      Objective:   Physical Exam  Constitutional: She appears well-developed and well-nourished.  Neck: Neck supple. No thyromegaly present.  Cardiovascular: Normal rate, regular rhythm and normal heart sounds.   Pulmonary/Chest: Effort normal and breath sounds normal. No respiratory distress. She has no wheezes. She has no rales.  Abdominal: Soft. Bowel sounds are normal. She exhibits no distension. There is no tenderness. There is no rebound and no guarding.  Musculoskeletal: She exhibits no edema.  Lymphadenopathy:    She has no cervical adenopathy.  Vitals reviewed.         Assessment & Plan:  Hyponatremia - Plan: sodium bicarbonate 650 MG tablet, BASIC METABOLIC PANEL WITH GFR, Osmolality, Osmolality, urine, Sodium, urine, random, Cortisol  Patient's presyncope certainly seems associated with her low blood pressure. 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 Therefore I'm concerned about possible adrenal insufficiency if she truly had hypovolemic hyponatremia. Therefore I will check a random cortisol. If her cortisol level is low, her sodium load, and her potassium elevated, we may need to undertake further testing for  adrenal insufficiency and possibly start the patient on mineralocorticoid. Meanwhile I will treat the patient for hyponatremia with sodium bicarbonate 650 mg by mouth twice a day and recheck her next week to see if her dizziness improves. I will also evaluate for SIADH with serum osmolarity, urine osmolarity, serum sodium, and a urine sodium.

## 2015-06-04 LAB — CORTISOL: Cortisol, Plasma: 7.9 ug/dL

## 2015-06-04 LAB — OSMOLALITY: Osmolality: 263 mOsm/kg — ABNORMAL LOW (ref 275–300)

## 2015-06-04 LAB — OSMOLALITY, URINE: Osmolality, Ur: 655 mOsm/kg (ref 390–1090)

## 2015-06-04 LAB — SODIUM, URINE, RANDOM: Sodium, Ur: 99 mmol/L (ref 28–272)

## 2015-06-06 ENCOUNTER — Encounter: Payer: Self-pay | Admitting: Family Medicine

## 2015-06-07 ENCOUNTER — Encounter: Payer: Self-pay | Admitting: Family Medicine

## 2015-06-07 ENCOUNTER — Ambulatory Visit (INDEPENDENT_AMBULATORY_CARE_PROVIDER_SITE_OTHER): Payer: PPO | Admitting: Family Medicine

## 2015-06-07 ENCOUNTER — Ambulatory Visit (HOSPITAL_COMMUNITY)
Admission: RE | Admit: 2015-06-07 | Discharge: 2015-06-07 | Disposition: A | Discharge status: Home / Self Care | Payer: PPO | Source: Ambulatory Visit | Attending: Family Medicine | Admitting: Family Medicine

## 2015-06-07 VITALS — BP 128/74 | HR 76 | Temp 97.9°F | Resp 20 | Ht 66.0 in | Wt 134.0 lb

## 2015-06-07 DIAGNOSIS — R0989 Other specified symptoms and signs involving the circulatory and respiratory systems: Secondary | ICD-10-CM | POA: Diagnosis not present

## 2015-06-07 DIAGNOSIS — E871 Hypo-osmolality and hyponatremia: Secondary | ICD-10-CM

## 2015-06-07 DIAGNOSIS — J449 Chronic obstructive pulmonary disease, unspecified: Secondary | ICD-10-CM | POA: Insufficient documentation

## 2015-06-07 DIAGNOSIS — R05 Cough: Secondary | ICD-10-CM | POA: Diagnosis not present

## 2015-06-07 DIAGNOSIS — R918 Other nonspecific abnormal finding of lung field: Secondary | ICD-10-CM

## 2015-06-07 LAB — BASIC METABOLIC PANEL WITH GFR
BUN: 9 mg/dL (ref 7–25)
CO2: 28 mmol/L (ref 20–31)
Calcium: 9.7 mg/dL (ref 8.6–10.4)
Chloride: 89 mmol/L — ABNORMAL LOW (ref 98–110)
Creat: 0.58 mg/dL — ABNORMAL LOW (ref 0.60–0.93)
GFR, Est African American: >89 mL/min (ref >=60)
GFR, Est Non African American: >89 mL/min (ref >=60)
Glucose, Bld: 70 mg/dL (ref 70–99)
Potassium: 4.7 mmol/L (ref 3.5–5.3)
Sodium: 126 mmol/L — ABNORMAL LOW (ref 135–146)

## 2015-06-07 NOTE — Progress Notes (Signed)
Subjective:    Patient ID: [name], female    DOB: Apr 02, 1941, 74 y.o.   MRN: [id number]  HPI 06/03/15 Patient went to the emergency room earlier this week. She states that while she was in the bathroom, she became extremely weak and lightheaded and dizzy like she was going to pass out and they called EMS and per the patient's report her systolic blood pressure was only in the 50s. She was taken to the hospital. The first documented blood pressure I can find in the hospital, her systolic blood pressure was in the 40J with her diastolic blood pressure in the 50s. She was given IV fluids and her blood pressure improved. However she was found also to be hyponatremic with a sodium level of 126. Her dizziness and presyncope was attributed to dehydration and she was discharged home. She is here today for follow-up. Patient states she continues to feel dizzy. She denies any presyncope but mainly complains of dizziness and weakness. Her sodium levels have always been in the 135 range previously.  At that time, my plan was: Patient's presyncope certainly seems associated with her low blood pressure. 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 Therefore I'm concerned about possible adrenal insufficiency if she truly had hypovolemic hyponatremia. Therefore I will check a random cortisol. If her cortisol level is low, her sodium load, and her potassium elevated, we may need to undertake further testing for adrenal insufficiency and possibly start the patient on mineralocorticoid. Meanwhile I will treat the patient for hyponatremia with sodium bicarbonate 650 mg by mouth twice a day and recheck her next week to see if her dizziness improves. I will also evaluate for SIADH with serum osmolarity, urine osmolarity, serum sodium, and a urine sodium.  06/07/15  labs revealed persistent hyponatremia with a sodium level of 127. Urine osmolarity was elevated at greater than 650 with a  serum osmolality less than 260. This is consistent with SIADH. Cortisol level was normal suggesting against adrenal insufficiency. I also thought the patient was hypovolemic. Therefore I started the patient on sodium bicarbonate in addition to asking her to drink fluids. Her blood pressure today is better at 128/74 although she is still complaining of dizziness and fatigue. She is also developing a worsening cough. On pulmonary exam today there are persistent left basilar crackles which are chronic for this patient given her previous lung surgery. However she also has some faint right basilar crackles that are new and were not present in there earlier this week Past Medical History  Diagnosis Date  . COPD (chronic obstructive pulmonary disease)   . Anxiety disorder   . Hypothyroidism   . PUD (peptic ulcer disease)     20yr  . Dysphagia   . IBS (irritable bowel syndrome)   . Diverticulosis   . Trigeminal neuralgia   . Diastolic dysfunction   . Allergic rhinitis   . 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 Hiatal hernia   . Aortic aneurysm   . Complication of anesthesia 2011    bloodpressure dropped during colonoscopy, not problems since  . Shortness of breath     with exertion  . Aneurysm of carotid artery     corrected by surgery 08/21/13  . Compression fracture   . On home O2 12/15/2013    chronic hypoxia  . Depression   . Emphysema lung 11/08/2014   Past Surgical History  Procedure Laterality Date  .  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 Esophagogastroduodenoscopy  11/13/2009    w/dilation to 54m, gastric ulceration (H.Pylori) s/p treatment  . Esophagogastroduodenoscopy  11/21/2009    distal esophageal web, gastritis  . Lung cancer surgery  12/2010    Left VATS, minithoracotomy, LLL superior segmentectomy  . Vocal cord surgery  02/06/2011    laryngoscopy with bilateral vocal cord Radiesse injection for vocal cord paralysis  .  Abdominal hysterectomy    . Cholecystectomy    . Tubal ligation    . Cataract extraction    . Fatty tumor removal from lt groin    . Cataract extraction w/phaco  09/02/2012    Procedure: CATARACT EXTRACTION PHACO AND INTRAOCULAR LENS PLACEMENT (IOC);  Surgeon: MElta GuadeloupeT. SGershon Crane MD;  [facility];  Service: Ophthalmology;  Laterality: Left;  CDE:10.35  . Endarterectomy Right 08/21/2013    Procedure: RIGHT CAROTID ANEURYSM RESECTION;  Surgeon: TRosetta Posner MD;  [facility];  Service: Vascular;  Laterality: Right;  . Fracture surgery Left     hand and left ring finger  . Orif wrist fracture Left 04/09/2014    Procedure: OPEN REDUCTION INTERNAL FIXATION (ORIF) WRIST FRACTURE;  Surgeon: TRenette Butters MD;  [place] [facility]  Service: Orthopedics;  Laterality: Left;  . Percutaneous pinning Left 04/09/2014    Procedure: PERCUTANEOUS PINNING EXTREMITY;  Surgeon: TRenette Butters MD;  [place] [facility]  Service: Orth[place]ics;  Laterality: Left;  . Esophagogastroduodenoscopy  03/14/12    SDVV:[id number] the distal esophagus/Mild gastritis/small HH. + H.pylori, prescribed Pylera. Finished treatment.   . Back surgery  January 13, 2014  . Colonoscopy N/A 11/23/2014    Procedure: COLONOSCOPY;  Surgeon: SDanie Binder MD;  [facility];  Service: Endoscopy;  Laterality: N/A;  930    Current Outpatient Prescriptions on File Prior to Visit  Medication Sig Dispense Refill  . calcium citrate-vitamin D (CITRACAL+D) 315-200 MG-UNIT per tablet Take 1 tablet by mouth daily.     . citalopram (CELEXA) 40 MG tablet Take 1 tablet (40 mg total) by mouth at bedtime. 90 tablet 4  . dextromethorphan-guaiFENesin (MUCINEX DM) 30-600 MG per 12 hr tablet Take 1 tablet by mouth 2 (two) times daily as needed for cough.    . Flaxseed, Linseed, (FLAXSEED OIL) OIL Take 1 capsule by mouth daily.    . hyoscyamine (LEVSIN, ANASPAZ) 0.125 MG tablet Take 1 tablet (0.125 mg total) by mouth every 6 (six) hours as needed.  (Patient taking differently: Take 0.125 mg by mouth every 6 (six) hours as needed (stomach). ) 120 tablet 5  . ibuprofen (ADVIL,MOTRIN) 200 MG tablet Take 400 mg by mouth every 6 (six) hours as needed (for pain).    .Marland Kitchenlactobacillus acidophilus (BACID) TABS tablet Take 1 tablet by mouth daily.    .Marland Kitchenlevothyroxine (SYNTHROID, LEVOTHROID) 25 MCG tablet Take 1 tablet (25 mcg total) by mouth daily before breakfast. 90 tablet 4  . loperamide (IMODIUM) 2 MG capsule Take 2 mg by mouth as needed for diarrhea or loose stools.    . lubiprostone (AMITIZA) 24 MCG capsule Take 1 capsule (24 mcg total) by mouth 2 (two) times daily with a meal. 60 capsule 3  . Methenamine-Sodium Salicylate (CYSTEX PO) Take 1 tablet by mouth daily as needed (UTI).     . Multiple Vitamin (MULTIVITAMIN WITH MINERALS) TABS Take 1 tablet by mouth daily.    .Marland Kitchenomeprazole (PRILOSEC) 40 MG capsule Take 40  mg by mouth daily.    . Oxcarbazepine (TRILEPTAL) 300 MG tablet Take 1 tablet (300 mg total) by mouth 2 (two) times daily. 180 tablet 3  . sodium bicarbonate 650 MG tablet Take 1 tablet (650 mg total) by mouth 2 (two) times daily. 60 tablet 1  . Tiotropium Bromide-Olodaterol (STIOLTO RESPIMAT) 2.5-2.5 MCG/ACT AERS Inhale 2 Inhalers into the lungs daily. 1 Inhaler 5   No current facility-administered medications on file prior to visit.   Allergies  Allergen Reactions  . Ciprofloxacin Hcl Hives  . Iohexol      Desc: Per [facility], pt is allergic to IV contrast, no type of reaction was available. Pt cannot remember but first reacted around 15 yrs ago from an IVP. Notes were date from 2009 at urology center., Onset Date: [id number]   . Prozac [Fluoxetine Hcl] Rash  . Sulfonamide Derivatives Hives and Rash   Social History   Social History  . Marital Status: Divorced    Spouse Name: N/A  . Number of Children: N/A  . Years of Education: N/A   Occupational History  . Not on file.   Social History Main Topics  . Smoking  status: Former Smoker -- 0.50 packs/day for 20 years    Types: Cigarettes    Quit date: 12/21/2010  . Smokeless tobacco: Never Used     Comment: smoking cessation info given and reviewed   . Alcohol Use: 0.0 oz/week    0 Standard drinks or equivalent per week     Comment: seldom  . Drug Use: No  . Sexual Activity: Yes    Birth Control/ Protection: Surgical   Other Topics Concern  . Not on file   Social History Narrative      Review of Systems  All other systems reviewed and are negative.      Objective:   Physical Exam  Constitutional: She appears well-developed and well-nourished.  Neck: Neck supple. No thyromegaly present.  Cardiovascular: Normal rate, regular rhythm and normal heart sounds.   Pulmonary/Chest: Effort normal and breath sounds normal. No respiratory distress. She has no wheezes. She has no rales.  Abdominal: Soft. Bowel sounds are normal. She exhibits no distension. There is no tenderness. There is no rebound and no guarding.  Musculoskeletal: She exhibits no edema.  Lymphadenopathy:    She has no cervical adenopathy.  Vitals reviewed.         Assessment & Plan:  Hyponatremia - Plan: BASIC METABOLIC PANEL WITH GFR  Lung field abnormal finding on examination - Plan: DG Chest 2 View  I believe the patient's dizziness if fatigue is related to hyponatremia. I will recheck a BMP today to monitor her sodium level on the sodium bicarbonate. Is still hyponatremic, we may need to institute a fluid restriction. Today on examination the patient does not appear to be dehydrated. I am concerned that there is a new abnormal finding on her pulmonary exam. Therefore I'll obtain a chest x-ray. I do not feel that the patient has pulmonary edema but I am concerned about a possible early lung infection given her severe COPD

## 2015-06-08 ENCOUNTER — Encounter: Payer: Self-pay | Admitting: Family Medicine

## 2015-06-09 ENCOUNTER — Encounter: Payer: Self-pay | Admitting: Family Medicine

## 2015-06-10 ENCOUNTER — Encounter: Payer: Self-pay | Admitting: Family Medicine

## 2015-06-14 ENCOUNTER — Other Ambulatory Visit: Payer: PPO

## 2015-06-14 DIAGNOSIS — R7989 Other specified abnormal findings of blood chemistry: Secondary | ICD-10-CM

## 2015-06-14 LAB — BASIC METABOLIC PANEL
BUN: 11 mg/dL (ref 7–25)
CO2: 30 mmol/L (ref 20–31)
Calcium: 10.2 mg/dL (ref 8.6–10.4)
Chloride: 94 mmol/L — ABNORMAL LOW (ref 98–110)
Creat: 0.49 mg/dL — ABNORMAL LOW (ref 0.60–0.93)
Glucose, Bld: 82 mg/dL (ref 70–99)
Potassium: 5.1 mmol/L (ref 3.5–5.3)
Sodium: 132 mmol/L — ABNORMAL LOW (ref 135–146)

## 2015-06-16 ENCOUNTER — Encounter: Payer: Self-pay | Admitting: Family Medicine

## 2015-06-18 ENCOUNTER — Encounter (HOSPITAL_COMMUNITY): Payer: Self-pay | Admitting: Hematology & Oncology

## 2015-06-22 IMAGING — CR DG CHEST 2V
2 series · 2 of 2 positions shown · non-contrast
Comparison: CT scan of the chest dated October 31, 2012 and PA and
lateral chest x-ray dated December 14, 2011.

CLINICAL DATA: Preop right carotid aneurysm resection.

EXAM:
CHEST  2 VIEW

[w chest pa]
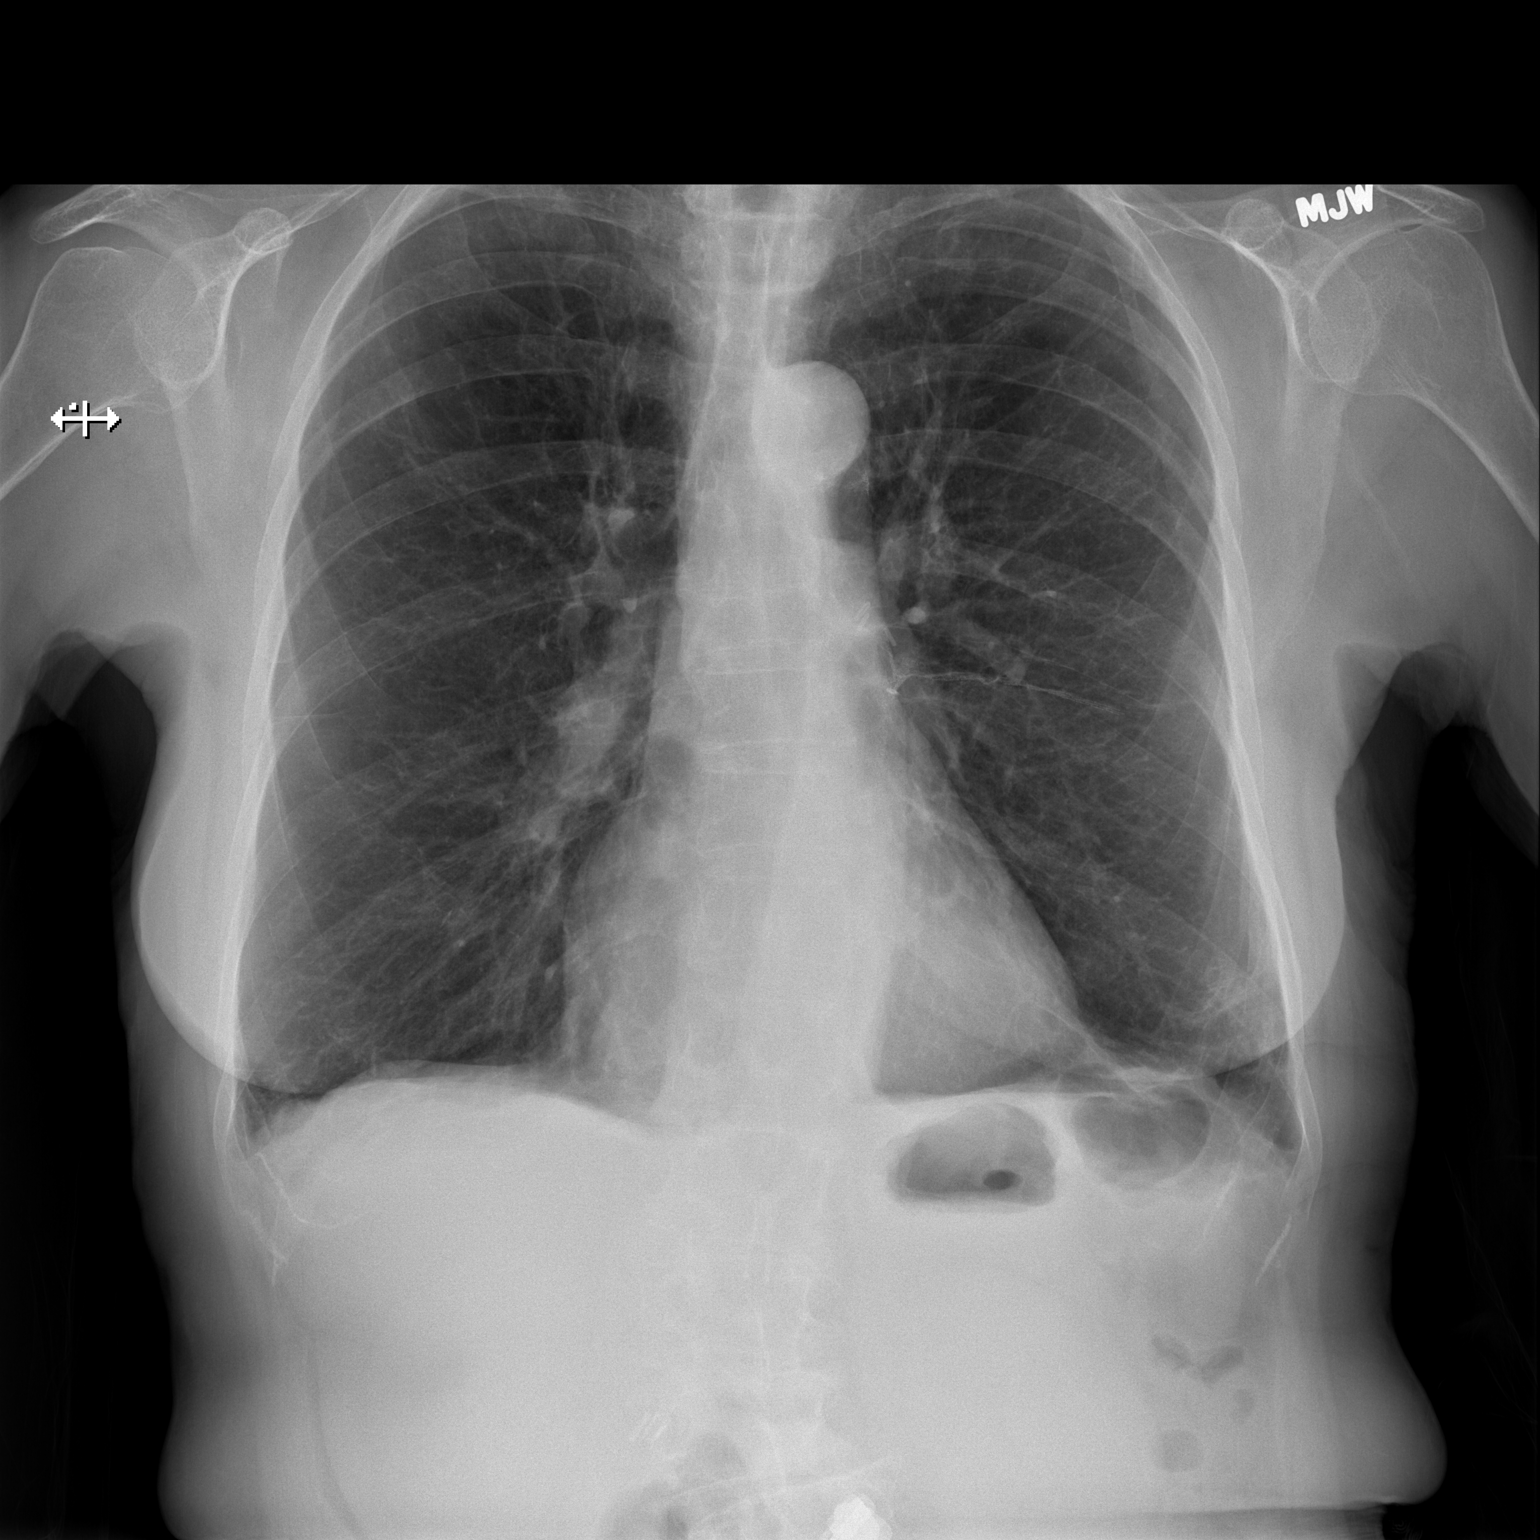

[w chest lat]
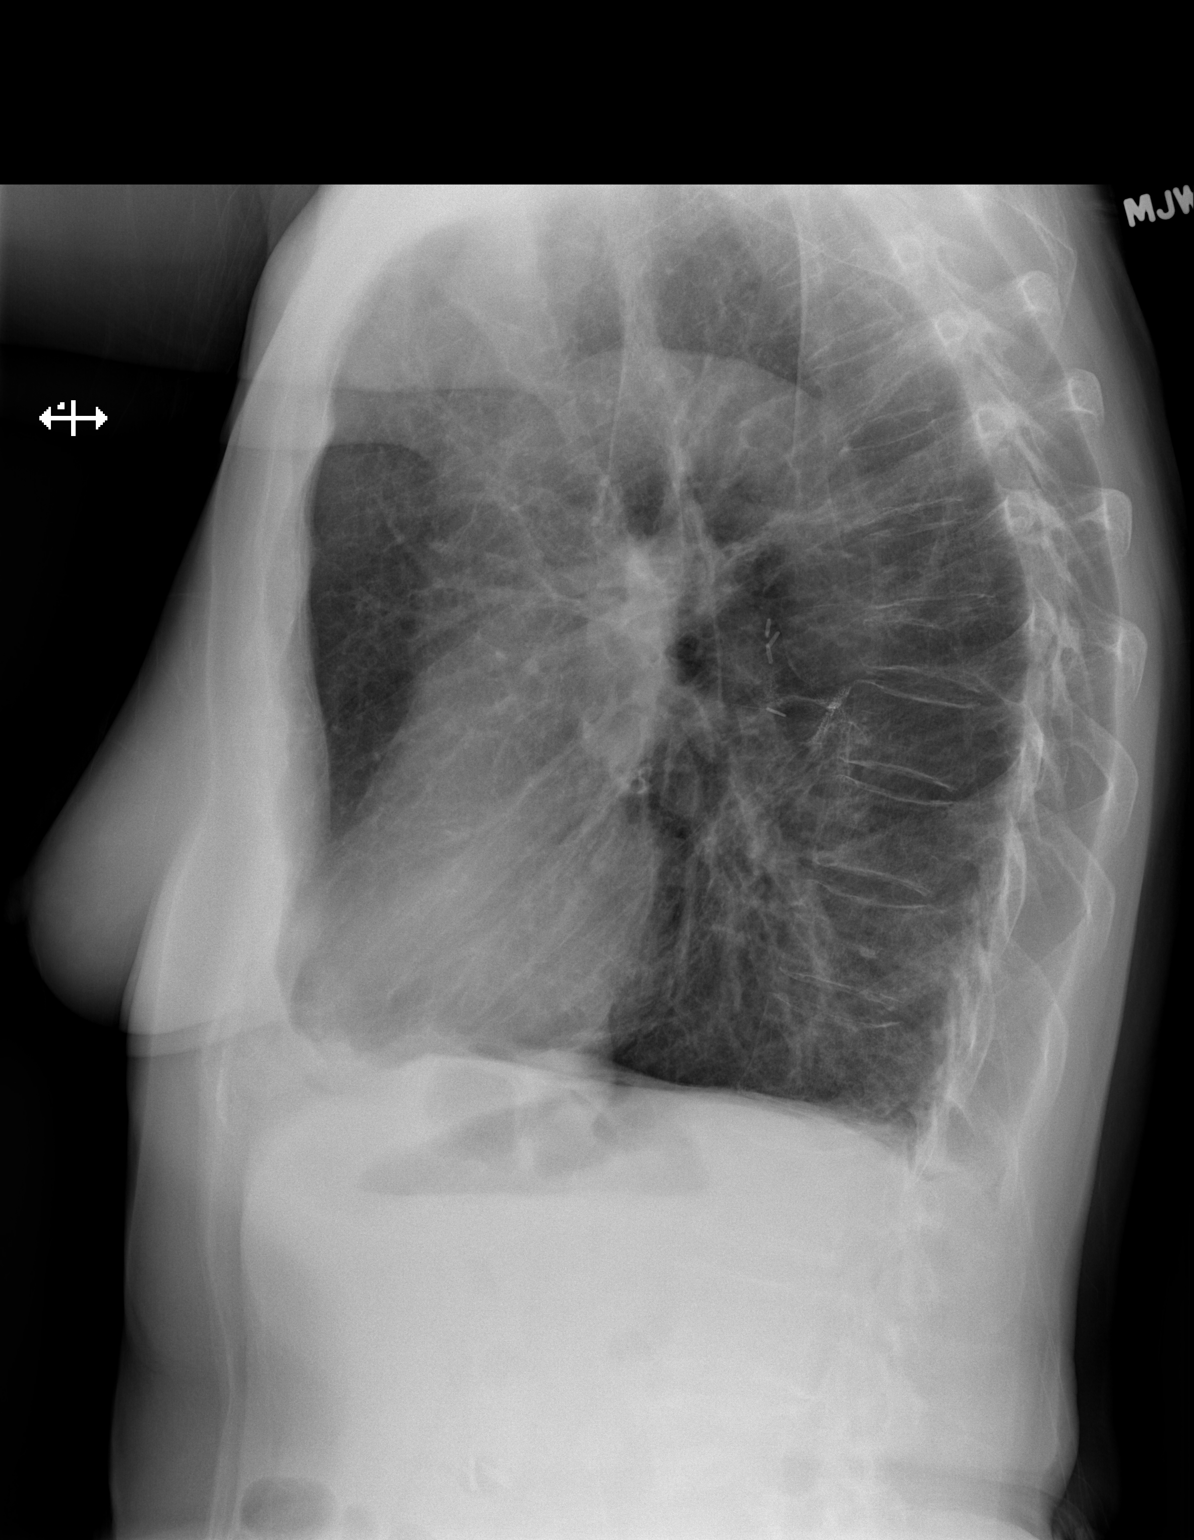

[2 of 2 positions shown; findings below may reference images not displayed]

FINDINGS: The lungs are hyperinflated with hemidiaphragm flattening consistent
with COPD. There are surgical clips on the left that lie posterior
to that hilar structures there is no evidence of a pneumo thorax or
pneumomediastinum. There is minimal fibrotic change at the left lung
base which appears stable. The cardiac silhouette is normal in size.
The pulmonary vascularity is not engorged. There is mild stable
wedge compression of a mid thoracic vertebral body.
IMPRESSION: There is hyperinflation consistent with COPD. There is no evidence
of pneumonia or CHF or other acute cardiopulmonary abnormality.
There are no findings suspicious for recurrent malignancy.

## 2015-07-03 ENCOUNTER — Encounter: Payer: Self-pay | Admitting: Family Medicine

## 2015-07-12 ENCOUNTER — Encounter: Payer: Self-pay | Admitting: Family Medicine

## 2015-07-12 ENCOUNTER — Ambulatory Visit (INDEPENDENT_AMBULATORY_CARE_PROVIDER_SITE_OTHER): Payer: PPO | Admitting: Family Medicine

## 2015-07-12 VITALS — BP 128/80 | HR 78 | Temp 97.9°F | Resp 16 | Ht 66.0 in | Wt 134.0 lb

## 2015-07-12 DIAGNOSIS — Z23 Encounter for immunization: Secondary | ICD-10-CM | POA: Diagnosis not present

## 2015-07-12 DIAGNOSIS — E871 Hypo-osmolality and hyponatremia: Secondary | ICD-10-CM | POA: Diagnosis not present

## 2015-07-12 LAB — BASIC METABOLIC PANEL WITH GFR
BUN: 13 mg/dL (ref 7–25)
CO2: 27 mmol/L (ref 20–31)
Calcium: 9.9 mg/dL (ref 8.6–10.4)
Chloride: 92 mmol/L — ABNORMAL LOW (ref 98–110)
Creat: 0.55 mg/dL — ABNORMAL LOW (ref 0.60–0.93)
GFR, Est African American: >89 mL/min (ref >=60)
GFR, Est Non African American: >89 mL/min (ref >=60)
Glucose, Bld: 79 mg/dL (ref 70–99)
Potassium: 4.8 mmol/L (ref 3.5–5.3)
Sodium: 128 mmol/L — ABNORMAL LOW (ref 135–146)

## 2015-07-12 MED ORDER — LUBIPROSTONE 24 MCG PO CAPS: 24.0000 ug | ORAL_CAPSULE | Freq: Two times a day (BID) | ORAL | Status: DC

## 2015-07-12 MED ORDER — OMEPRAZOLE 40 MG PO CPDR: 40.0000 mg | DELAYED_RELEASE_CAPSULE | Freq: Every day | ORAL | Status: DC

## 2015-07-12 NOTE — Progress Notes (Signed)
Subjective:    Patient ID: [name], female    DOB: 12/06/1940, 74 y.o.   MRN: [id number]  HPI 06/03/15 Patient went to the emergency room earlier this week. She states that while she was in the bathroom, she became extremely weak and lightheaded and dizzy like she was going to pass out and they called EMS and per the patient's report her systolic blood pressure was only in the 50s. She was taken to the hospital. The first documented blood pressure I can find in the hospital, her systolic blood pressure was in the 16B with her diastolic blood pressure in the 50s. She was given IV fluids and her blood pressure improved. However she was found also to be hyponatremic with a sodium level of 126. Her dizziness and presyncope was attributed to dehydration and she was discharged home. She is here today for follow-up. Patient states she continues to feel dizzy. She denies any presyncope but mainly complains of dizziness and weakness. Her sodium levels have always been in the 135 range previously.  At that time, my plan was: Patient's presyncope certainly seems associated with her low blood pressure. 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 Therefore I'm concerned about possible adrenal insufficiency if she truly had hypovolemic hyponatremia. Therefore I will check a random cortisol. If her cortisol level is low, her sodium load, and her potassium elevated, we may need to undertake further testing for adrenal insufficiency and possibly start the patient on mineralocorticoid. Meanwhile I will treat the patient for hyponatremia with sodium bicarbonate 650 mg by mouth twice a day and recheck her next week to see if her dizziness improves. I will also evaluate for SIADH with serum osmolarity, urine osmolarity, serum sodium, and a urine sodium.  06/07/15  labs revealed persistent hyponatremia with a sodium level of 127. Urine osmolarity was elevated at greater than 650 with a  serum osmolality less than 260. This is consistent with SIADH. Cortisol level was normal suggesting against adrenal insufficiency. I also thought the patient was hypovolemic. Therefore I started the patient on sodium bicarbonate in addition to asking her to drink fluids. Her blood pressure today is better at 128/74 although she is still complaining of dizziness and fatigue. She is also developing a worsening cough. On pulmonary exam today there are persistent left basilar crackles which are chronic for this patient given her previous lung surgery. However she also has some faint right basilar crackles that are new and were not present in there earlier this week.  At that time, my plan was: I believe the patient's dizziness and fatigue is related to hyponatremia. I will recheck a BMP today to monitor her sodium level on the sodium bicarbonate. Is still hyponatremic, we may need to institute a fluid restriction. Today on examination the patient does not appear to be dehydrated. I am concerned that there is a new abnormal finding on her pulmonary exam. Therefore I'll obtain a chest x-ray. I do not feel that the patient has pulmonary edema but I am concerned about a possible early lung infection given her severe COPD  07/12/15 Patient is restricting her fluid to less than 1200 mL per day. She is also trying to consume a higher sodium diet. She discontinued the sodium bicarbonate tablets. She feels much better with fluid restriction. She denies any dizziness or falls. Her fatigue has improved. She refuses a flu shot even though I strongly recommended that given her significant pulmonary past medical  history including COPD requiring oxygen as well as lung cancer. Today she is 94% on room air without oxygen which is a marked improvement for this patient Past Medical History  Diagnosis Date  . COPD (chronic obstructive pulmonary disease)   . Anxiety disorder   . Hypothyroidism   . PUD (peptic ulcer disease)      70yr  . Dysphagia   . IBS (irritable bowel syndrome)   . Diverticulosis   . Trigeminal neuralgia   . Diastolic dysfunction   . Allergic rhinitis   . 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 Hiatal hernia   . Aortic aneurysm   . Complication of anesthesia 2011    bloodpressure dropped during colonoscopy, not problems since  . Shortness of breath     with exertion  . Aneurysm of carotid artery     corrected by surgery 08/21/13  . Compression fracture   . On home O2 12/15/2013    chronic hypoxia  . Depression   . Emphysema lung 11/08/2014   Past Surgical History  Procedure Laterality Date  . 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 Esophagogastroduodenoscopy  11/13/2009    w/dilation to 124m gastric ulceration (H.Pylori) s/p treatment  . Esophagogastroduodenoscopy  11/21/2009    distal esophageal web, gastritis  . Lung cancer surgery  12/2010    Left VATS, minithoracotomy, LLL superior segmentectomy  . Vocal cord surgery  02/06/2011    laryngoscopy with bilateral vocal cord Radiesse injection for vocal cord paralysis  . Abdominal hysterectomy    . Cholecystectomy    . Tubal ligation    . Cataract extraction    . Fatty tumor removal from lt groin    . Cataract extraction w/phaco  09/02/2012    Procedure: CATARACT EXTRACTION PHACO AND INTRAOCULAR LENS PLACEMENT (IOC);  Surgeon: MaElta Guadeloupe. ShGershon CraneMD;  Location: [facility];  Service: Ophthalmology;  Laterality: Left;  CDE:10.35  . Endarterectomy Right 08/21/2013    Procedure: RIGHT CAROTID ANEURYSM RESECTION;  Surgeon: ToRosetta PosnerMD;  [facility];  Service: Vascular;  Laterality: Right;  . Fracture surgery Left     hand and left ring finger  . Orif wrist fracture Left 04/09/2014    Procedure: OPEN REDUCTION INTERNAL FIXATION (ORIF) WRIST FRACTURE;  Surgeon: TiRenette ButtersMD;  [facility];  Laterality: Left;  . Percutaneous pinning Left 04/09/2014     Procedure: PERCUTANEOUS PINNING EXTREMITY;  Surgeon: TiRenette ButtersMD;  [facility];  Laterality: Left;  . Esophagogastroduodenoscopy  03/14/12    [id number] the distal esophagus/Mild gastritis/small HH. + H.pylori, prescribed Pylera. Finished treatment.   . Back surgery  January 13, 2014  . Colonoscopy N/A 11/23/2014    Procedure: COLONOSCOPY;  Surgeon: SaDanie BinderMD;  [facility];  Service: Endoscopy;  Laterality: N/A;  930    Current Outpatient Prescriptions on File Prior to Visit  Medication Sig Dispense Refill  . calcium citrate-vitamin D (CITRACAL+D) 315-200 MG-UNIT per tablet Take 1 tablet by mouth daily.     . citalopram (CELEXA) 40 MG tablet Take 1 tablet (40 mg total) by mouth at bedtime. 90 tablet 4  . dextromethorphan-guaiFENesin (MUCINEX DM) 30-600 MG per 12 hr tablet Take 1 tablet by mouth 2 (two) times daily as needed for cough.    . Flaxseed, Linseed, (FLAXSEED OIL) OIL Take 1 capsule by mouth daily.    .Marland Kitchen  hyoscyamine (LEVSIN, ANASPAZ) 0.125 MG tablet Take 1 tablet (0.125 mg total) by mouth every 6 (six) hours as needed. (Patient taking differently: Take 0.125 mg by mouth every 6 (six) hours as needed (stomach). ) 120 tablet 5  . ibuprofen (ADVIL,MOTRIN) 200 MG tablet Take 400 mg by mouth every 6 (six) hours as needed (for pain).    Marland Kitchen lactobacillus acidophilus (BACID) TABS tablet Take 1 tablet by mouth daily.    Marland Kitchen levothyroxine (SYNTHROID, LEVOTHROID) 25 MCG tablet Take 1 tablet (25 mcg total) by mouth daily before breakfast. 90 tablet 4  . loperamide (IMODIUM) 2 MG capsule Take 2 mg by mouth as needed for diarrhea or loose stools.    . lubiprostone (AMITIZA) 24 MCG capsule Take 1 capsule (24 mcg total) by mouth 2 (two) times daily with a meal. 60 capsule 3  . Methenamine-Sodium Salicylate (CYSTEX PO) Take 1 tablet by mouth daily as needed (UTI).     . Multiple Vitamin (MULTIVITAMIN WITH MINERALS) TABS Take 1 tablet by mouth daily.     Marland Kitchen omeprazole (PRILOSEC) 40 MG capsule Take 40 mg by mouth daily.    . Oxcarbazepine (TRILEPTAL) 300 MG tablet Take 1 tablet (300 mg total) by mouth 2 (two) times daily. 180 tablet 3  . sodium bicarbonate 650 MG tablet Take 1 tablet (650 mg total) by mouth 2 (two) times daily. 60 tablet 1  . Tiotropium Bromide-Olodaterol (STIOLTO RESPIMAT) 2.5-2.5 MCG/ACT AERS Inhale 2 Inhalers into the lungs daily. 1 Inhaler 5   No current facility-administered medications on file prior to visit.   Allergies  Allergen Reactions  . Ciprofloxacin Hcl Hives  . Iohexol      Desc: Per [facility], pt is allergic to IV contrast, no type of reaction was available. Pt cannot remember but first reacted around 15 yrs ago from an IVP. Notes were date from 2009 at urology center., Onset Date: [id number]   . Prozac [Fluoxetine Hcl] Rash  . Sulfonamide Derivatives Hives and Rash   Social History   Social History  . Marital Status: Divorced    Spouse Name: N/A  . Number of Children: N/A  . Years of Education: N/A   Occupational History  . Not on file.   Social History Main Topics  . Smoking status: Former Smoker -- 0.50 packs/day for 20 years    Types: Cigarettes    Quit date: 12/21/2010  . Smokeless tobacco: Never Used     Comment: smoking cessation info given and reviewed   . Alcohol Use: 0.0 oz/week    0 Standard drinks or equivalent per week     Comment: seldom  . Drug Use: No  . Sexual Activity: Yes    Birth Control/ Protection: Surgical   Other Topics Concern  . Not on file   Social History Narrative      Review of Systems  All other systems reviewed and are negative.      Objective:   Physical Exam  Constitutional: She appears well-developed and well-nourished.  Neck: Neck supple. No thyromegaly present.  Cardiovascular: Normal rate, regular rhythm and normal heart sounds.   Pulmonary/Chest: Effort normal and breath sounds normal. No respiratory distress. She has no wheezes.  She has no rales.  Abdominal: Soft. Bowel sounds are normal. She exhibits no distension. There is no tenderness. There is no rebound and no guarding.  Musculoskeletal: She exhibits no edema.  Lymphadenopathy:    She has no cervical adenopathy.  Vitals reviewed.  Assessment & Plan:   Hyponatremia - Plan: BASIC METABOLIC PANEL WITH GFR  I will recheck a BMP today. If the patient's sodium level is greater than 131 fluid restriction, I will continue the fluid restriction and discontinue the sodium bicarbonate tablets. Patient seems to be doing well otherwise. I strongly recommended a flu shot but she declined today.

## 2015-07-12 NOTE — Addendum Note (Signed)
Addended by: Shary Decamp B on: 07/12/2015 11:28 AM   Modules accepted: Orders

## 2015-07-14 ENCOUNTER — Encounter: Payer: Self-pay | Admitting: Family Medicine

## 2015-07-14 DIAGNOSIS — E871 Hypo-osmolality and hyponatremia: Secondary | ICD-10-CM

## 2015-07-14 MED ORDER — SODIUM BICARBONATE 650 MG PO TABS: 650.0000 mg | ORAL_TABLET | Freq: Two times a day (BID) | ORAL | Status: DC

## 2015-07-19 ENCOUNTER — Encounter: Payer: Self-pay | Admitting: Family Medicine

## 2015-07-26 ENCOUNTER — Encounter: Payer: Self-pay | Admitting: Family Medicine

## 2015-08-01 ENCOUNTER — Other Ambulatory Visit (HOSPITAL_COMMUNITY): Payer: Self-pay | Admitting: Hematology & Oncology

## 2015-08-01 ENCOUNTER — Ambulatory Visit (HOSPITAL_COMMUNITY)
Admission: RE | Admit: 2015-08-01 | Discharge: 2015-08-01 | Disposition: A | Discharge status: Home / Self Care | Payer: PPO | Source: Ambulatory Visit | Attending: Family Medicine | Admitting: Family Medicine

## 2015-08-01 ENCOUNTER — Encounter: Payer: Self-pay | Admitting: Family Medicine

## 2015-08-01 ENCOUNTER — Ambulatory Visit (INDEPENDENT_AMBULATORY_CARE_PROVIDER_SITE_OTHER): Payer: PPO | Admitting: Family Medicine

## 2015-08-01 ENCOUNTER — Ambulatory Visit (HOSPITAL_COMMUNITY): Payer: PPO

## 2015-08-01 ENCOUNTER — Ambulatory Visit (HOSPITAL_COMMUNITY)
Admission: RE | Admit: 2015-08-01 | Discharge: 2015-08-01 | Disposition: A | Discharge status: Home / Self Care | Payer: PPO | Source: Ambulatory Visit | Attending: Hematology & Oncology | Admitting: Hematology & Oncology

## 2015-08-01 VITALS — BP 124/80 | HR 68 | Temp 98.3°F | Resp 16 | Ht 66.0 in | Wt 134.0 lb

## 2015-08-01 DIAGNOSIS — Z85118 Personal history of other malignant neoplasm of bronchus and lung: Secondary | ICD-10-CM | POA: Diagnosis not present

## 2015-08-01 DIAGNOSIS — Z1231 Encounter for screening mammogram for malignant neoplasm of breast: Secondary | ICD-10-CM

## 2015-08-01 DIAGNOSIS — R091 Pleurisy: Secondary | ICD-10-CM

## 2015-08-01 DIAGNOSIS — R0781 Pleurodynia: Secondary | ICD-10-CM | POA: Diagnosis not present

## 2015-08-01 NOTE — Progress Notes (Signed)
Subjective:    Patient ID: [name], female    DOB: 05-Jul-1941, 74 y.o.   MRN: [id number]  HPI Starting last week, the patient developed pain underneath her right scapula. It would hurt worse to take a deep breath in or to cough hard. She has full range of motion in her right shoulder. She has no pain with abduction. She has a negative empty can sign. She has negative Hawkins sign. She has negative O'Brien sign. She has no pain with internal or external rotation of her right shoulder. However on exam today she does have faint right basilar and right mid lung field crackles which are not present anywhere else on her pulmonary exam. This is a new finding. She does have a history of lung cancer but that surgery was performed in her left lung. Past Medical History  Diagnosis Date  . COPD (chronic obstructive pulmonary disease) ([place])   . Anxiety disorder   . Hypothyroidism   . PUD (peptic ulcer disease)     62yr  . Dysphagia   . IBS (irritable bowel syndrome)   . Diverticulosis   . Trigeminal neuralgia   . Diastolic dysfunction   . Allergic rhinitis   . [place] ([facility]    left lung/surg only  . Hiatal hernia   . Aortic aneurysm ([facility]   . Complication of anesthesia 2011    bloodpressure dropped during colonoscopy, not problems since  . Shortness of breath     with exertion  . 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[facility]     corrected by surgery 08/21/13  . Compression fracture   . On home O2 12/15/2013    chronic hypoxia  . Depression   . [place]physe[place] lung ([facility] 11/08/2014   Past Surgical History  Procedure Laterality Date  . 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 Esophagogastroduodenoscopy  11/13/2009    w/dilation to 176m gastric ulceration (H.Pylori) s/p treatment  . Esophagogastroduodenoscopy  11/21/2009    distal esophageal web, gastritis  . Lung cancer surgery  12/2010    Left VATS, minithoracotomy, LLL superior segmentectomy    . Vocal cord surgery  02/06/2011    laryngoscopy with bilateral vocal cord Radiesse injection for vocal cord paralysis  . Abdominal hysterectomy    . Cholecystectomy    . Tubal ligation    . Cataract extraction    . Fatty tumor removal from lt groin    . Cataract extraction w/phaco  09/02/2012    Procedure: CATARACT EXTRACTION PHACO AND INTRAOCULAR LENS PLACEMENT (IOC);  Surgeon: MaElta Guadeloupe. ShGershon CraneMD;  [facility];  Service: Ophthalmology;  Laterality: Left;  CDE:10.35  . Endarterectomy Right 08/21/2013    Procedure: RIGHT CAROTID ANEURYSM RESECTION;  Surgeon: ToRosetta PosnerMD;  [facility];  Service: Vascular;  Laterality: Right;  . Fracture surgery Left     hand and left ring finger  . Orif wrist fracture Left 04/09/2014    Procedure: OPEN REDUCTION INTERNAL FIXATION (ORIF) WRIST FRACTURE;  Surgeon: TiRenette ButtersMD;  [facility]: [facility];  Laterality: Left;  . Percutaneous pinning Left 04/09/2014    Procedure: PERCUTANEOUS PINNING EXTREMITY;  Surgeon: TiRenette ButtersMD;  [place] [facility] [facility]: [facility];  Laterality: Left;  . Esophagogastroduodenoscopy  03/14/12    [id number] the distal esophagus/Mild gastritis/small HH. + H.pylori, prescribed Pylera. Finished treatment.   . Back surgery  January 13, 2014  . Colonoscopy  N/A 11/23/2014    Procedure: COLONOSCOPY;  Surgeon: Danie Binder, MD;  [facility];  Service: Endoscopy;  Laterality: N/A;  930    Current Outpatient Prescriptions on File Prior to Visit  Medication Sig Dispense Refill  . calcium citrate-vitamin D (CITRACAL+D) 315-200 MG-UNIT per tablet Take 1 tablet by mouth daily.     . citalopram (CELEXA) 40 MG tablet Take 1 tablet (40 mg total) by mouth at bedtime. 90 tablet 4  . dextromethorphan-guaiFENesin (MUCINEX DM) 30-600 MG per 12 hr tablet Take 1 tablet by mouth 2 (two) times daily as needed for cough.    . Flaxseed, Linseed, (FLAXSEED OIL) OIL Take 1 capsule by mouth daily.     . hyoscyamine (LEVSIN, ANASPAZ) 0.125 MG tablet Take 1 tablet (0.125 mg total) by mouth every 6 (six) hours as needed. (Patient taking differently: Take 0.125 mg by mouth every 6 (six) hours as needed (stomach). ) 120 tablet 5  . ibuprofen (ADVIL,MOTRIN) 200 MG tablet Take 400 mg by mouth every 6 (six) hours as needed (for pain).    Marland Kitchen lactobacillus acidophilus (BACID) TABS tablet Take 1 tablet by mouth daily.    Marland Kitchen levothyroxine (SYNTHROID, LEVOTHROID) 25 MCG tablet Take 1 tablet (25 mcg total) by mouth daily before breakfast. 90 tablet 4  . loperamide (IMODIUM) 2 MG capsule Take 2 mg by mouth as needed for diarrhea or loose stools.    . lubiprostone (AMITIZA) 24 MCG capsule Take 1 capsule (24 mcg total) by mouth 2 (two) times daily with a meal. 180 capsule 3  . Methenamine-Sodium Salicylate (CYSTEX PO) Take 1 tablet by mouth daily as needed (UTI).     . Multiple Vitamin (MULTIVITAMIN WITH MINERALS) TABS Take 1 tablet by mouth daily.    Marland Kitchen omeprazole (PRILOSEC) 40 MG capsule Take 40 mg by mouth daily.    Marland Kitchen omeprazole (PRILOSEC) 40 MG capsule Take 1 capsule (40 mg total) by mouth daily. 90 capsule 3  . Oxcarbazepine (TRILEPTAL) 300 MG tablet Take 1 tablet (300 mg total) by mouth 2 (two) times daily. 180 tablet 3  . sodium bicarbonate 650 MG tablet Take 1 tablet (650 mg total) by mouth 2 (two) times daily. 60 tablet 1  . Tiotropium Bromide-Olodaterol (STIOLTO RESPIMAT) 2.5-2.5 MCG/ACT AERS Inhale 2 Inhalers into the lungs daily. 1 Inhaler 5   No current facility-administered medications on file prior to visit.   Allergies  Allergen Reactions  . Ciprofloxacin Hcl Hives  . Iohexol      Desc: Per [facility], pt is allergic to IV contrast, no type of reaction was available. Pt cannot remember but first reacted around 15 yrs ago from an IVP. Notes were date from 2009 at urology center., Onset Date: [id number]   . Prozac [Fluoxetine Hcl] Rash  . Sulfonamide Derivatives Hives and Rash   Social  History   Social History  . Marital Status: Divorced    Spouse Name: N/A  . Number of Children: N/A  . Years of Education: N/A   Occupational History  . Not on file.   Social History Main Topics  . Smoking status: Former Smoker -- 0.50 packs/day for 20 years    Types: Cigarettes    Quit date: 12/21/2010  . Smokeless tobacco: Never Used     Comment: smoking cessation info given and reviewed   . Alcohol Use: 0.0 oz/week    0 Standard drinks or equivalent per week     Comment: seldom  . Drug Use: No  .  Sexual Activity: Yes    Birth Control/ Protection: Surgical   Other Topics Concern  . Not on file   Social History Narrative      Review of Systems  All other systems reviewed and are negative.      Objective:   Physical Exam  Constitutional: She appears well-developed and well-nourished.  HENT:  Right Ear: External ear normal.  Left Ear: External ear normal.  Mouth/Throat: Oropharynx is clear and moist.  Neck: Neck supple.  Cardiovascular: Normal rate, regular rhythm and normal heart sounds.   Pulmonary/Chest: Effort normal. No stridor. No respiratory distress. She has no wheezes. She has rales. She exhibits no tenderness.  Abdominal: Soft. Bowel sounds are normal.  Lymphadenopathy:    She has no cervical adenopathy.  Vitals reviewed.         Assessment & Plan:  Pleurisy - Plan: DG Chest 2 View  It could certainly be possible that this could be a pulled intercostal muscle on the right side. However I am unable to reproduce the pain with any muscle challenge test. Given the abnormal pulmonary exam in the right basilar crackles in the right mid lung field crackles, I feel it is important we get a chest x-ray to immediately rule out walking pneumonia, etc. The patient will go for the chest x-ray now.

## 2015-08-02 ENCOUNTER — Encounter: Payer: Self-pay | Admitting: Family Medicine

## 2015-08-03 ENCOUNTER — Ambulatory Visit (HOSPITAL_COMMUNITY): Payer: PPO

## 2015-08-15 ENCOUNTER — Other Ambulatory Visit: Payer: PPO

## 2015-08-15 DIAGNOSIS — E871 Hypo-osmolality and hyponatremia: Secondary | ICD-10-CM

## 2015-08-15 LAB — BASIC METABOLIC PANEL
BUN: 12 mg/dL (ref 7–25)
CO2: 26 mmol/L (ref 20–31)
Calcium: 9.5 mg/dL (ref 8.6–10.4)
Chloride: 98 mmol/L (ref 98–110)
Creat: 0.48 mg/dL — ABNORMAL LOW (ref 0.60–0.93)
Glucose, Bld: 81 mg/dL (ref 70–99)
Potassium: 4.6 mmol/L (ref 3.5–5.3)
Sodium: 135 mmol/L (ref 135–146)

## 2015-08-16 ENCOUNTER — Other Ambulatory Visit: Payer: Self-pay | Admitting: Family Medicine

## 2015-08-16 NOTE — Telephone Encounter (Signed)
Refill appropriate and filled per protocol. 

## 2015-08-29 IMAGING — CT CT CHEST W/O CM
2 of 3 series · 15 of 36 positions shown, 18 images · non-contrast
Comparison: Chest radiograph 08/19/2013, chest CT 10/31/2012,
07/19/2011 and 11/01/2010.

CLINICAL DATA: Status post partial left lower lobectomy for lung
cancer 1211. Restaging.

EXAM:
CT CHEST WITHOUT CONTRAST
TECHNIQUE: Multidetector CT imaging of the chest was performed following the
standard protocol without IV contrast.

[Series 2: chestroutine 5.0 b40f · axial · 0.63mm/px · z∈[-317,-52]mm · 12 of 63 slices shown, 15 images]
[im 5/63  mediastinal]
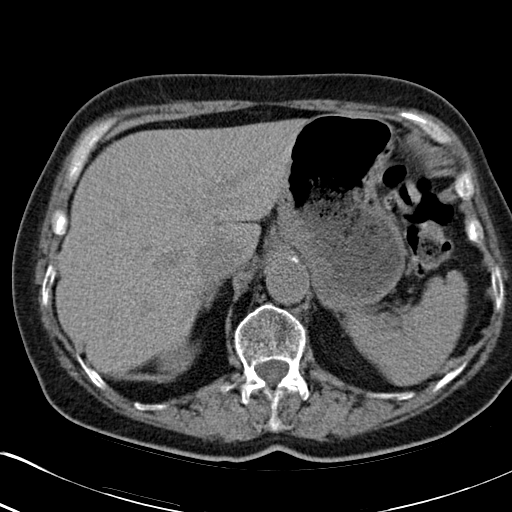
[im 5/63  lung]
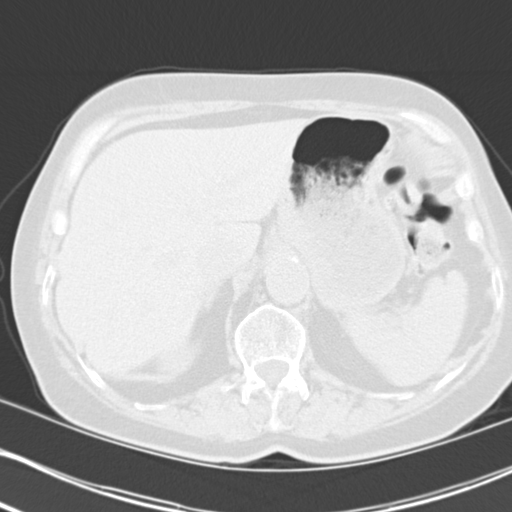
[im 10/63  lung]
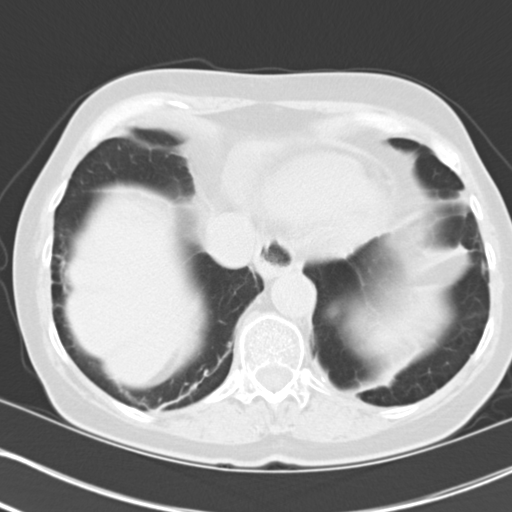
[im 14/63  lung]
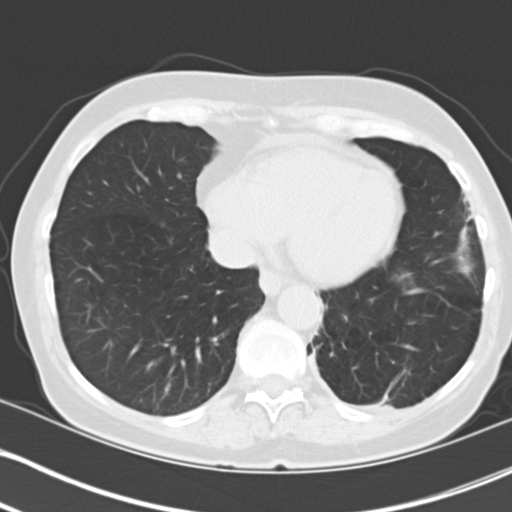
[im 19/63  lung]
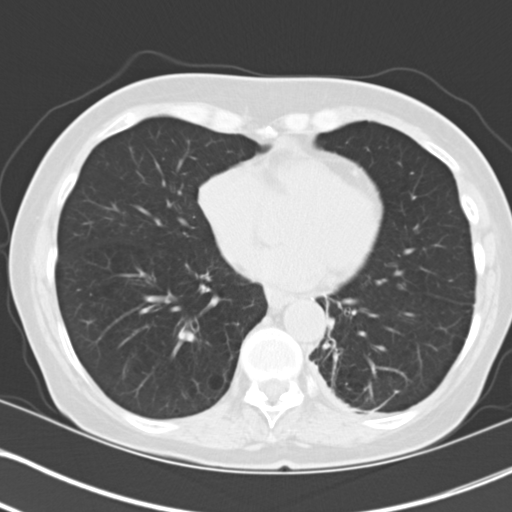
[im 23/63  mediastinal]
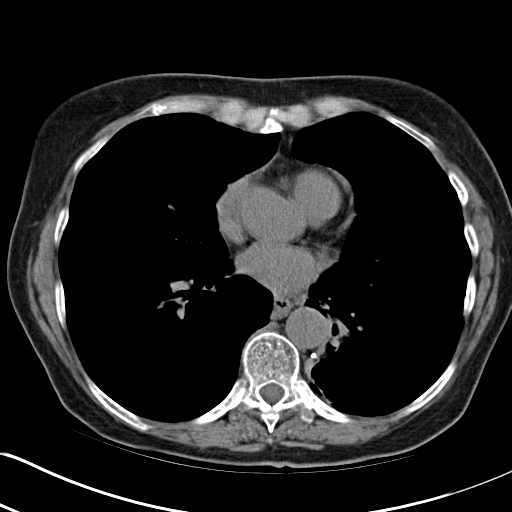
[im 23/63  lung]
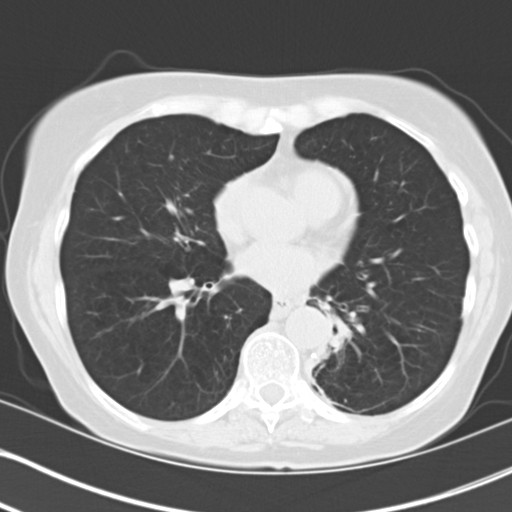
[im 28/63  lung]
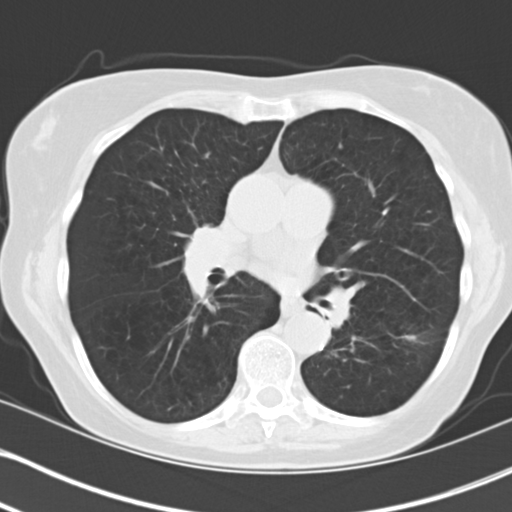
[im 35/63  lung]
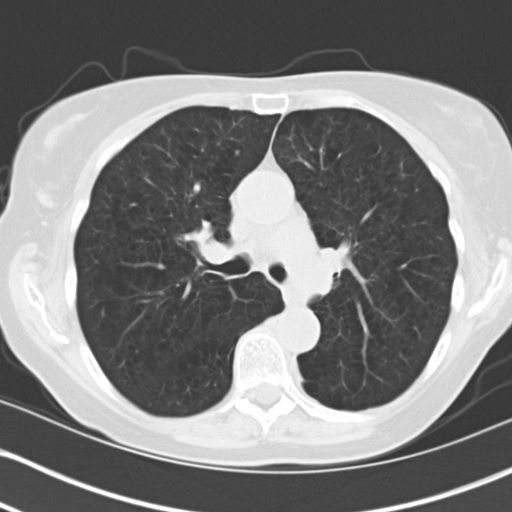
[im 40/63  lung]
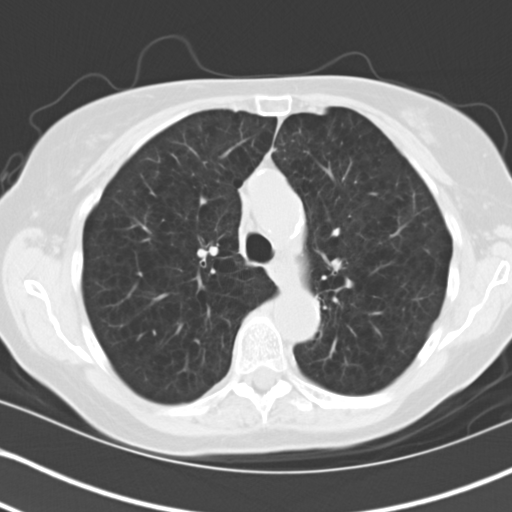
[im 44/63  mediastinal]
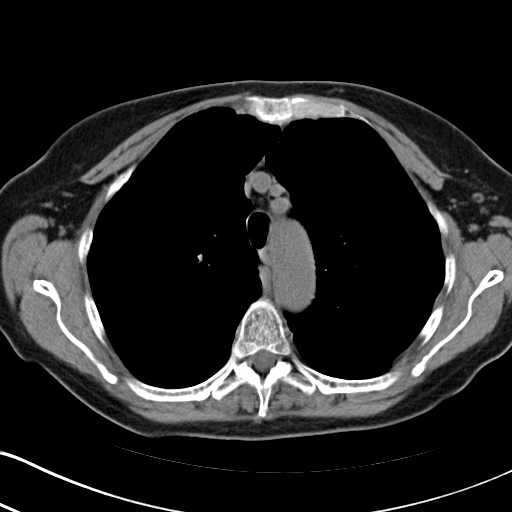
[im 44/63  lung]
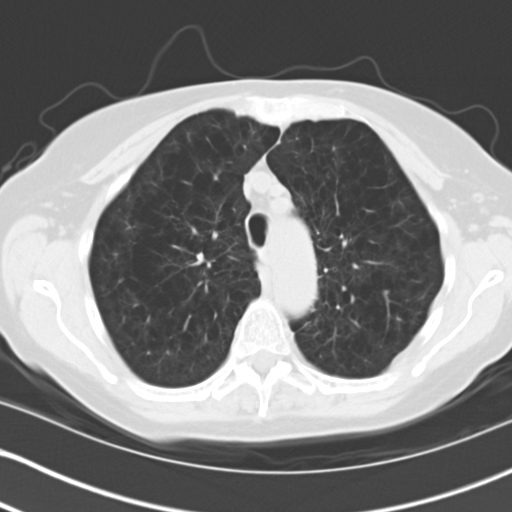
[im 49/63  lung]
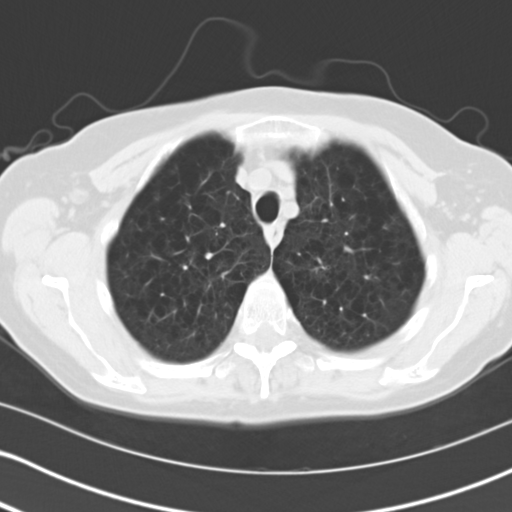
[im 53/63  lung]
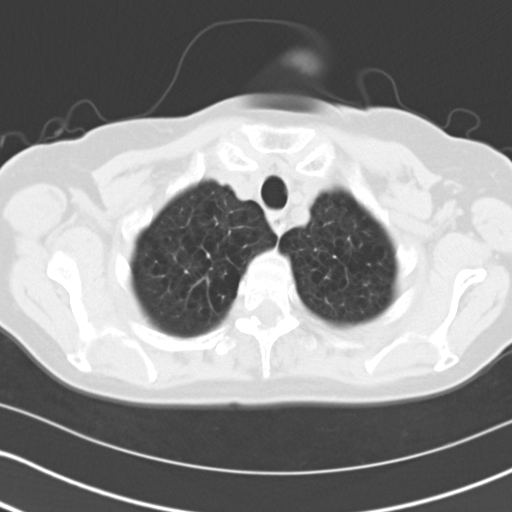
[im 58/63  lung]
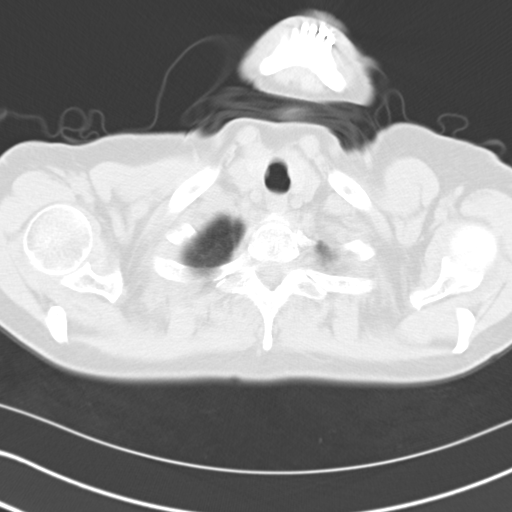

[Series 4: mpr coro 3mm · coronal · 0.62mm/px · 3 of 80 slices shown]
[im 16/80  lung]
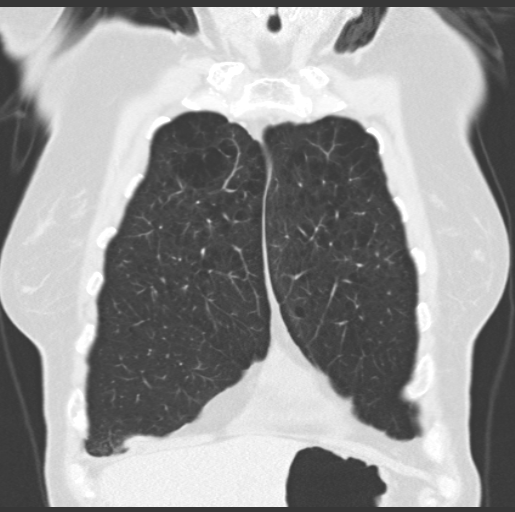
[im 32/80  lung]
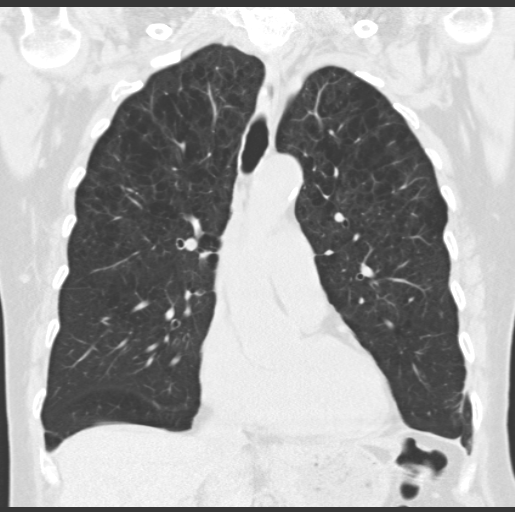
[im 48/80  lung]
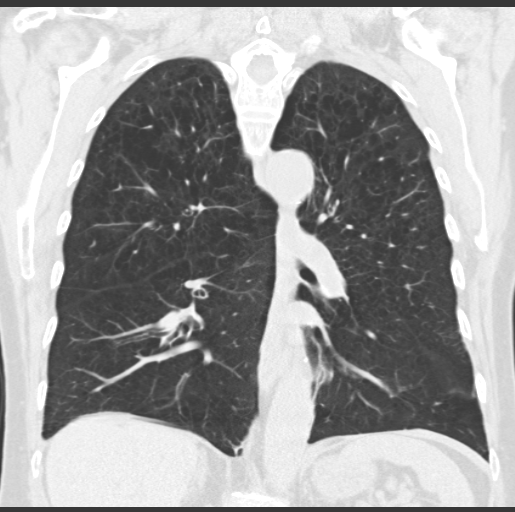

[15 of 36 positions shown; findings below may reference images not displayed]

FINDINGS: Emphysematous changes are reidentified with evidence of partial left
lower lobectomy. Scarring at the left lung base image 35 is stable.
Subpleural curvilinear dependent atelectasis and or scarring is
noted at the left lower lobe, with apparent interval resolution of
the previously seen 8 mm nodular area of more focal presumed
scarring or atelectasis medially. Curvilinear right lower lobe
subpleural scarring/ atelectasis again noted. No new pulmonary
nodule, mass, or consolidation.

No new lytic or sclerotic osseous lesion. T8 compression deformity
reidentified. Mild atheromatous aortic calcification without
aneurysm. Small pericardial fluid is stable. Great vessels are
normal in caliber. No mediastinal or hilar lymphadenopathy allowing
for decreased conspicuity of lymph nodes due to lack of contrast.
IMPRESSION: Resolution of nodular component of presumed left lower lobe
subpleural scarring/ atelectasis status post partial left lower
lobectomy. No new CT evidence for recurrent malignancy or acute
intrathoracic abnormality.

## 2015-09-20 ENCOUNTER — Other Ambulatory Visit: Payer: PPO

## 2015-09-20 ENCOUNTER — Encounter: Payer: PPO | Admitting: Family Medicine

## 2015-09-20 ENCOUNTER — Encounter (HOSPITAL_COMMUNITY)
Admission: RE | Disposition: A | Discharge status: Home / Self Care | Payer: Self-pay | Source: Ambulatory Visit | Attending: Ophthalmology

## 2015-09-20 ENCOUNTER — Ambulatory Visit (HOSPITAL_COMMUNITY)
Admission: RE | Admit: 2015-09-20 | Discharge: 2015-09-20 | Disposition: A | Discharge status: Home / Self Care | Payer: PPO | Source: Ambulatory Visit | Attending: Ophthalmology | Admitting: Ophthalmology

## 2015-09-20 DIAGNOSIS — Z79899 Other long term (current) drug therapy: Secondary | ICD-10-CM

## 2015-09-20 DIAGNOSIS — E871 Hypo-osmolality and hyponatremia: Secondary | ICD-10-CM

## 2015-09-20 DIAGNOSIS — H26492 Other secondary cataract, left eye: Secondary | ICD-10-CM | POA: Diagnosis not present

## 2015-09-20 HISTORY — PX: YAG LASER APPLICATION: SHX6189

## 2015-09-20 LAB — COMPREHENSIVE METABOLIC PANEL
ALT: 15 U/L (ref 6–29)
AST: 18 U/L (ref 10–35)
Albumin: 4.3 g/dL (ref 3.6–5.1)
Alkaline Phosphatase: 77 U/L (ref 33–130)
BUN: 11 mg/dL (ref 7–25)
CO2: 31 mmol/L (ref 20–31)
Calcium: 10.1 mg/dL (ref 8.6–10.4)
Chloride: 96 mmol/L — ABNORMAL LOW (ref 98–110)
Creat: 0.56 mg/dL — ABNORMAL LOW (ref 0.60–0.93)
Glucose, Bld: 86 mg/dL (ref 70–99)
Potassium: 5 mmol/L (ref 3.5–5.3)
Sodium: 134 mmol/L — ABNORMAL LOW (ref 135–146)
Total Bilirubin: 0.5 mg/dL (ref 0.2–1.2)
Total Protein: 6.8 g/dL (ref 6.1–8.1)

## 2015-09-20 SURGERY — TREATMENT, USING YAG LASER
Anesthesia: LOCAL | Laterality: Left

## 2015-09-20 MED ORDER — TROPICAMIDE 1 % OP SOLN
1.0000 [drp] | OPHTHALMIC | Status: AC
  Administered 2015-09-20 (×3): 1 [drp] via OPHTHALMIC

## 2015-09-20 NOTE — Op Note (Signed)
[name] T. Gershon Crane, MD  Procedure: Yag Capsulotomy  Yag Laser Self Test Completedyes. Procedure: Posterior Capsulotomy, Eye Protection Worn by Staff yes. Laser In Use Sign on Door yes.  Laser: Nd:YAG Spot Size: Fixed Burst Mode: III Power Setting: 3.4 mJ/burst Number of shots: 17 Total energy delivered: 57.21 mJ   The patient tolerated the procedure without difficulty. No complications were encountered.   The patient was discharged home with the instructions to continue all her current glaucoma medications, if any.   Patient instructed to go to office at 0100 for intraocular pressure check.  Patient verbalizes understanding of discharge instructions Yes.  .     Pre-Operative Diagnosis: After-Cataract, obscuring vision, 366.53 OS Post-Operative Diagnosis: After-Cataract, obscuring vision, 366.53 OS Date of Cataract Surgery: 09/02/2013

## 2015-09-20 NOTE — Discharge Instructions (Signed)
[name]  09/20/2015     Instructions    Activity: No Restrictions.   Diet: Resume Diet you were on at home.   Pain Medication: Tylenol if Needed.   CONTACT YOUR DOCTOR IF YOU HAVE PAIN, REDNESS IN YOUR EYE, OR DECREASED VISION.   Follow-up:today with Rutherford Guys, MD.   Dr. Gershon Crane: [phone]  Dr. Iona Hansen: [phone]  Dr. Geoffry Paradise: [phone]   If you find that you cannot contact your physician, but feel that your signs and   Symptoms warrant a physician's attention, call the Emergency Room at   [phone] [phone].   OtherFollow up with Dr. Gershon Crane today at 1:00 PM..

## 2015-09-20 NOTE — H&P (Signed)
The patient was re examined and there is no change in the patients condition since the original H and P. 

## 2015-09-21 ENCOUNTER — Encounter (HOSPITAL_COMMUNITY): Payer: Self-pay | Admitting: Ophthalmology

## 2015-09-22 ENCOUNTER — Encounter: Payer: Self-pay | Admitting: Family Medicine

## 2015-09-23 ENCOUNTER — Encounter: Payer: Self-pay | Admitting: Family Medicine

## 2015-09-23 ENCOUNTER — Encounter: Payer: Self-pay | Admitting: Vascular Surgery

## 2015-09-26 ENCOUNTER — Encounter: Payer: Self-pay | Admitting: Family Medicine

## 2015-10-15 IMAGING — CR DG PELVIS 1-2V
1 series · 1 of 1 positions shown · non-contrast
Comparison: 11/13/2010

CLINICAL DATA: Hip pain after fall.

EXAM:
PELVIS - 1-2 VIEW

[view not recorded]
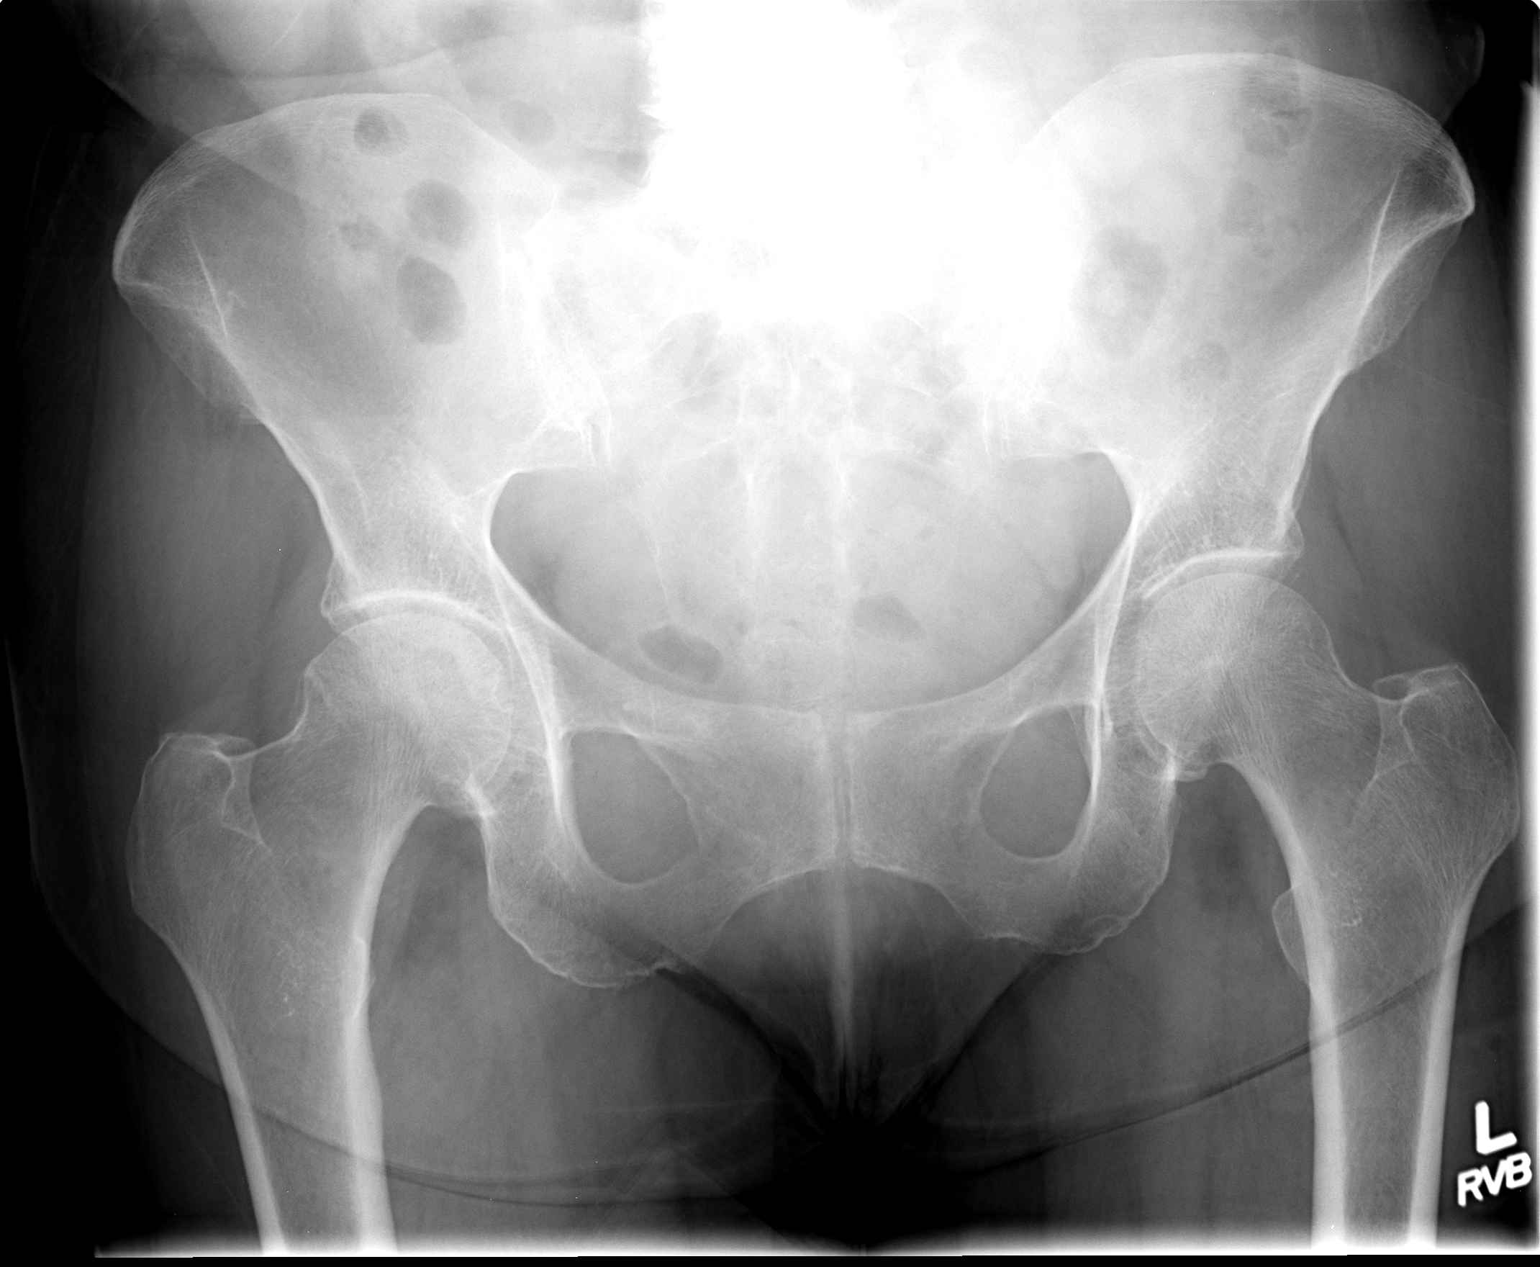

[1 of 1 positions shown; findings below may reference images not displayed]

FINDINGS: Pelvic bony ring is intact. No gross abnormality to the SI joints.
Disc space changes at L4-L5. Sclerosis at the right hip joint and
unclear if this is related to the acetabulum or femoral head. No
evidence for a gross fracture or dislocation. Mild irregularity
along the superior aspect of the right femoral head and neck appears
to be degenerative in etiology.
IMPRESSION: No acute bone abnormality to the pelvis.

Nonspecific sclerosis in the right hip. This may be better
characterized with a dedicated right hip examination.

## 2015-10-15 IMAGING — CR DG LUMBAR SPINE COMPLETE 4+V
5 series · 5 of 5 positions shown · non-contrast
Comparison: None

CLINICAL DATA: Back pain and flank pain

EXAM:
LUMBAR SPINE - COMPLETE 4+ VIEW

[view not recorded (1 of 5)]
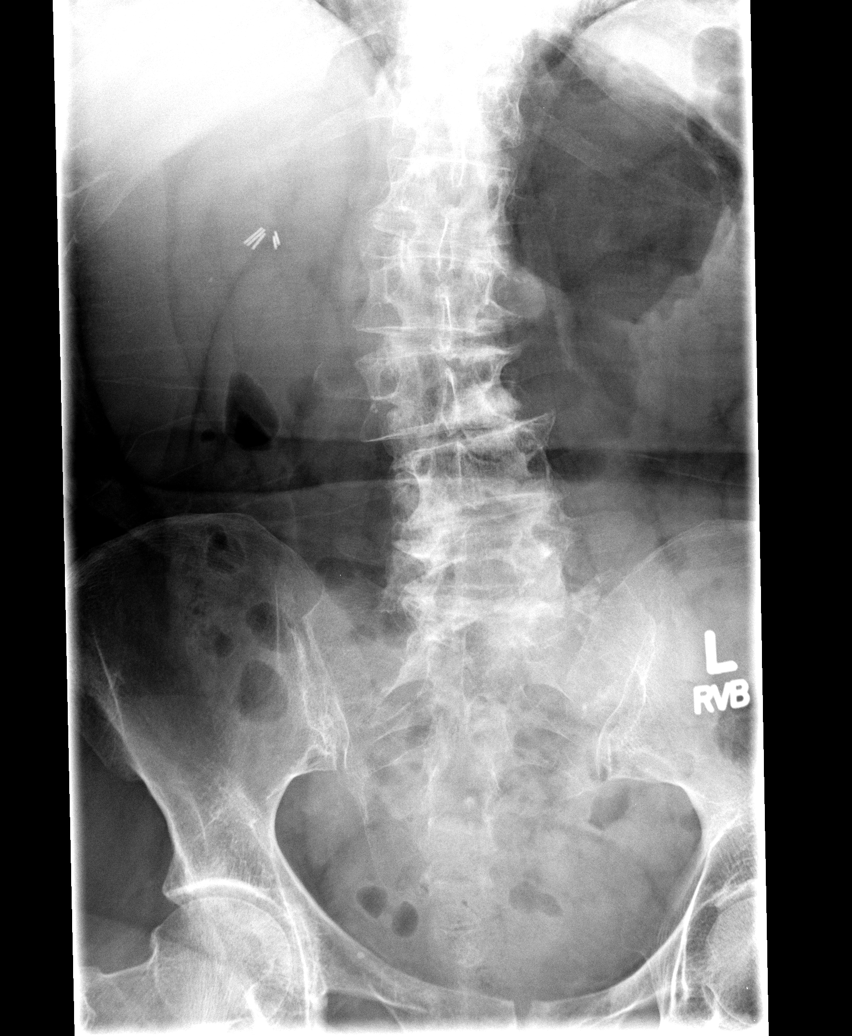

[view not recorded (2 of 5)]
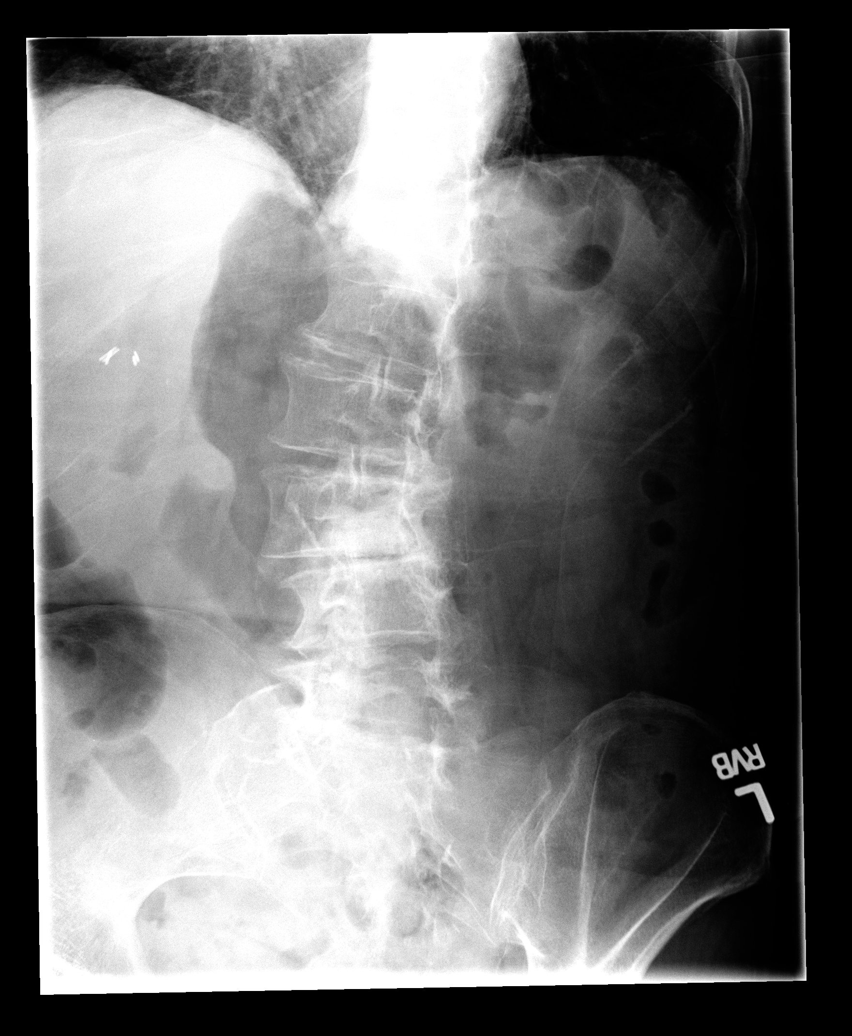

[view not recorded (3 of 5)]
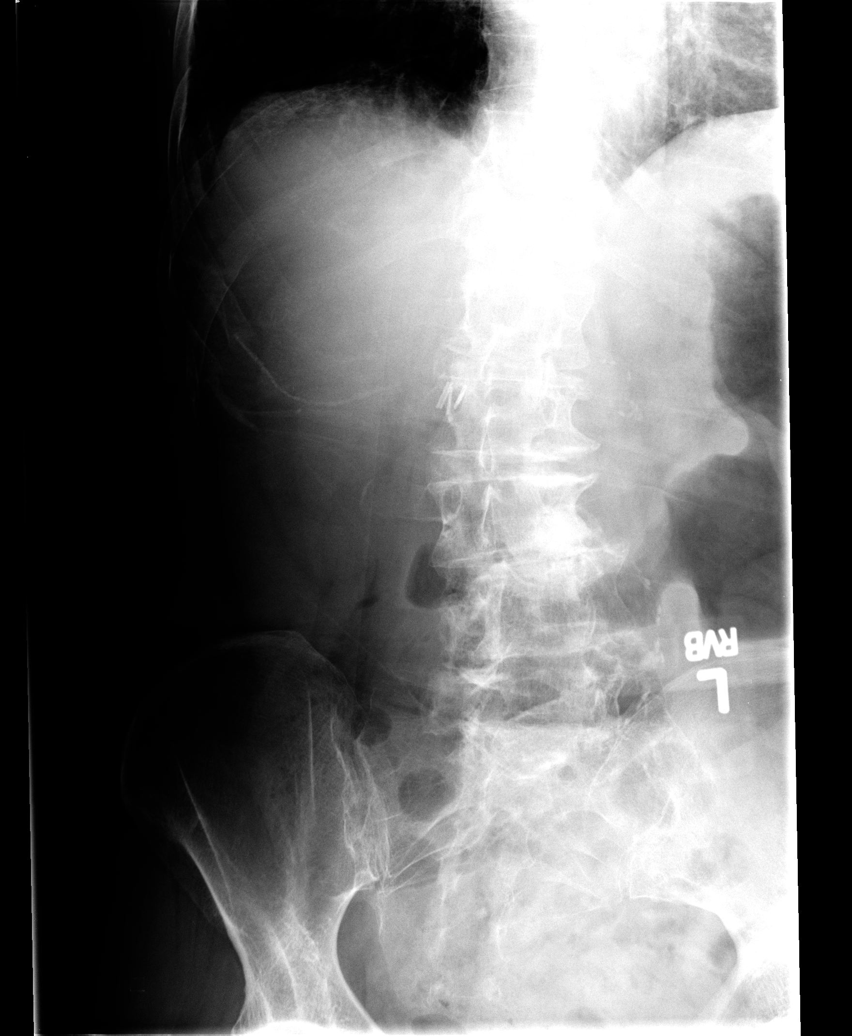

[view not recorded (4 of 5)]
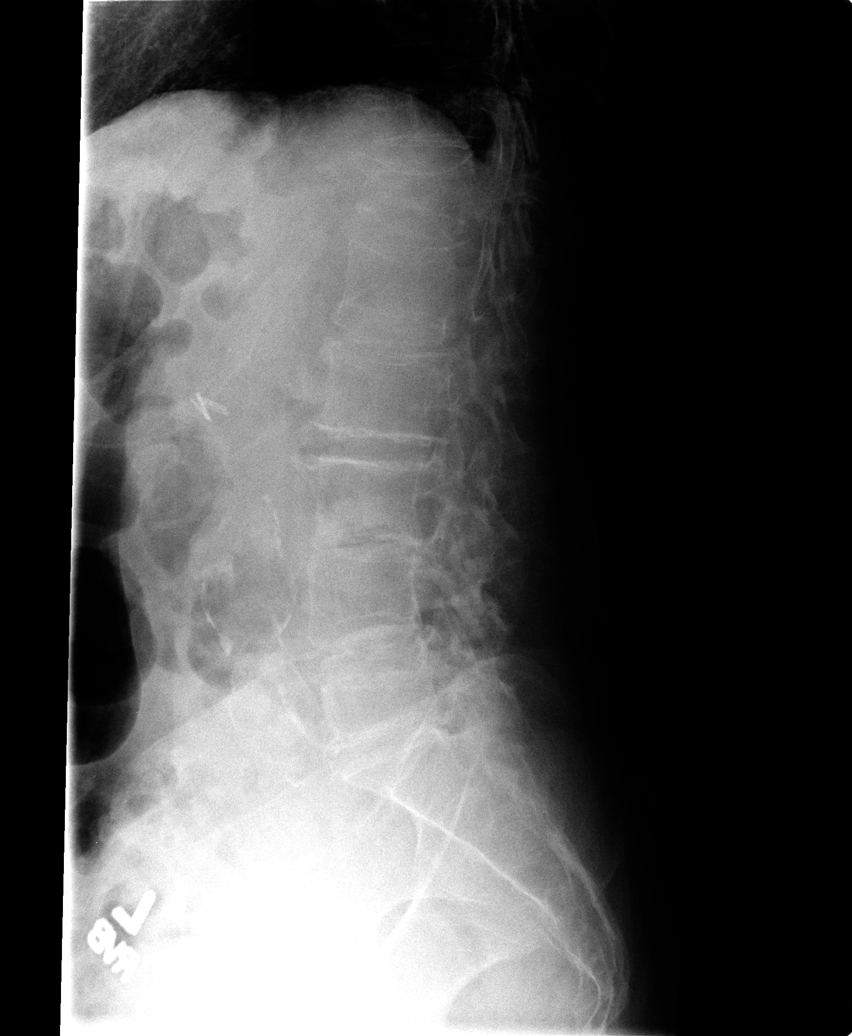

[view not recorded (5 of 5)]
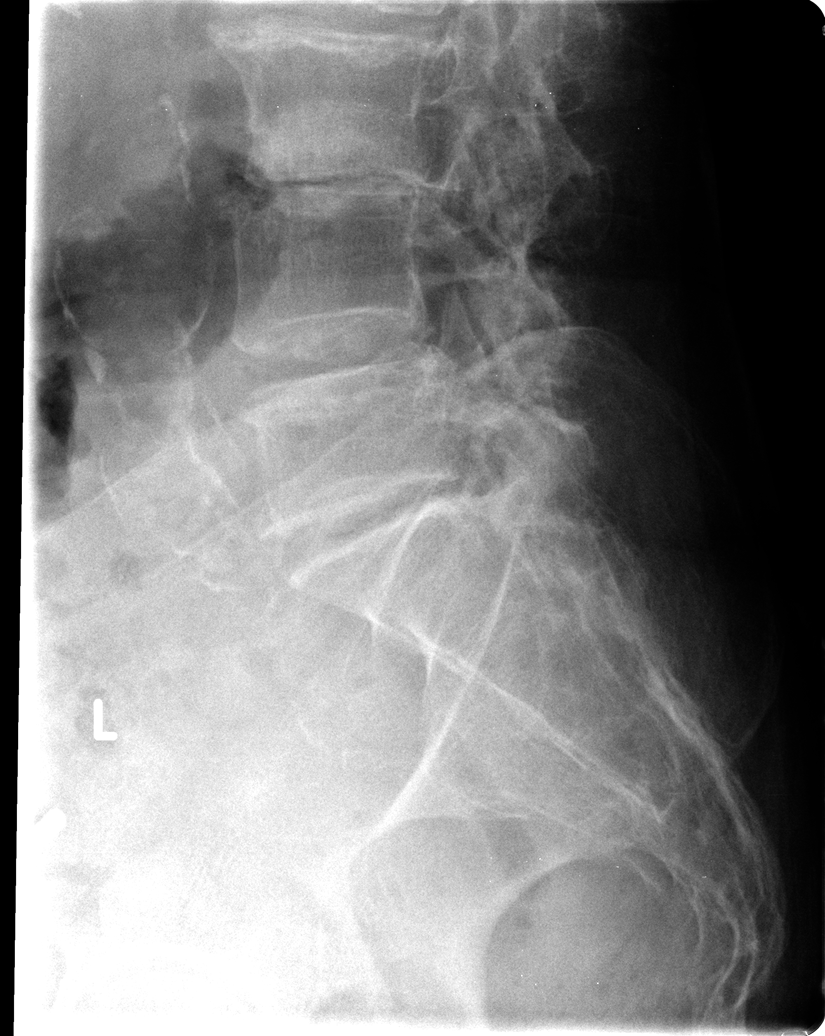

[5 of 5 positions shown; findings below may reference images not displayed]

FINDINGS: There is a scoliosis deformity involving the lumbar spine. Multi
level disc space narrowing and ventral endplate spurring is
identified compatible with degenerative disc disease. T12
compression deformity appears new from the previous examination. No
retropulsion of fracture fragments identified. There is calcified
atherosclerotic disease involving the abdominal aorta. Aneurysmal
dilatation of the aorta measures up to 4.1 cm.
IMPRESSION: 1. Scoliosis and multilevel degenerative disc disease.
2. T12 compression deformity is age indeterminate but is new from
08/19/2013
3. Atherosclerotic disease with abdominal aortic aneurysm. Recommend
follow up by US in 8year. This recommendation follows ACR consensus
guidelines: White Paper of the ACR Incidental Findings Committee II

## 2015-10-16 IMAGING — CR DG CHEST 1V PORT
1 series · 1 of 1 positions shown · non-contrast
Comparison: 08/19/2013

CLINICAL DATA: Short of breath.  Hypoxia.

EXAM:
PORTABLE CHEST - 1 VIEW

[portable]
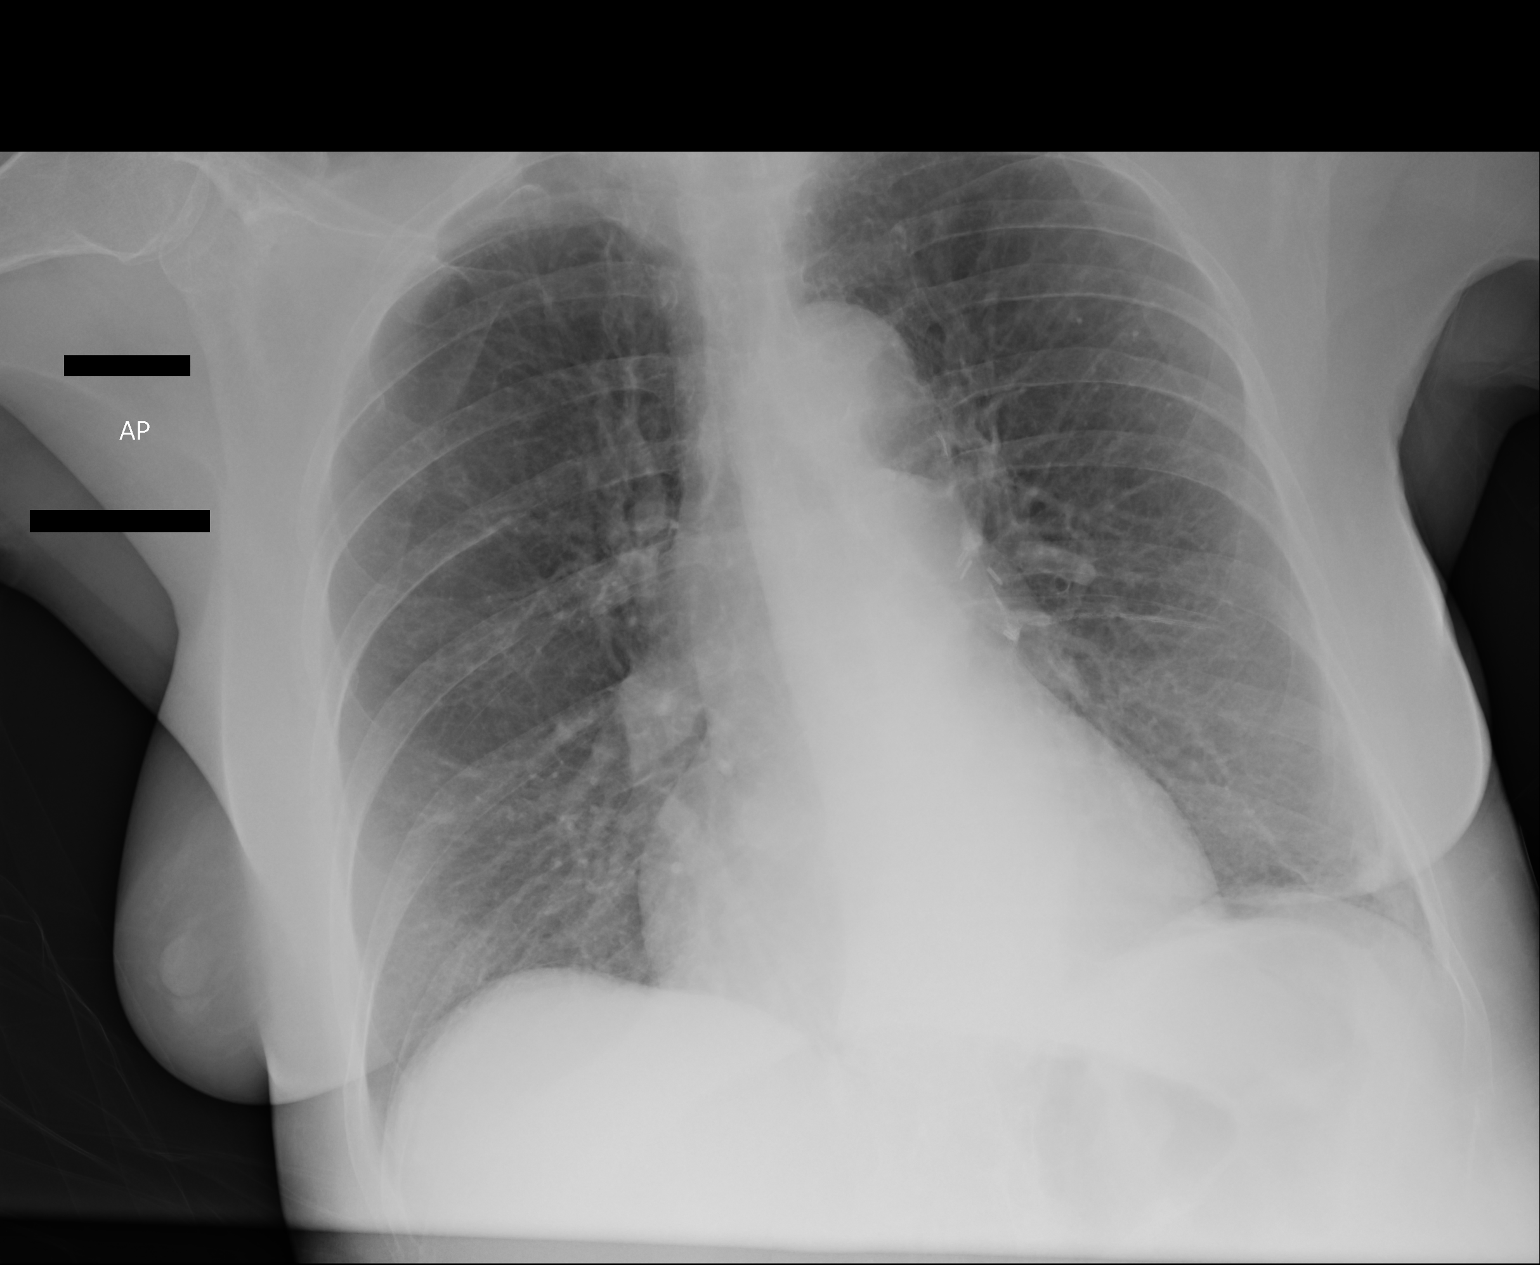

[1 of 1 positions shown; findings below may reference images not displayed]

FINDINGS: Lungs are hyperexpanded. There is prominence of the bronchovascular
markings, but no consolidation or edema. Scarring is noted in the
left lung base, stable. No pleural effusion or pneumothorax.

There are surgical vascular clips along the left hilum, stable.

Cardiac silhouette is normal in size. No mediastinal or hilar
masses.

Bony thorax is demineralized but intact.
IMPRESSION: No acute cardiopulmonary disease.  COPD.

## 2015-10-20 IMAGING — US US EXTREM LOW VENOUS BILAT
1 series · 14 of 24 positions shown · non-contrast
Comparison: None.

CLINICAL DATA: Lower extremity pain

EXAM:
BILATERAL LOWER EXTREMITY VENOUS DOPPLER ULTRASOUND
TECHNIQUE: Gray-scale sonography with graded compression, as well as color
Doppler and duplex ultrasound, were performed to evaluate the deep
venous system from the level of the common femoral vein through the
popliteal and proximal calf veins. Spectral Doppler was utilized to
evaluate flow at rest and with distal augmentation maneuvers.

[Series 1: us extrem low venous bilat · 0.05mm/px · 14 of 69 slices shown]
[im 1/69]
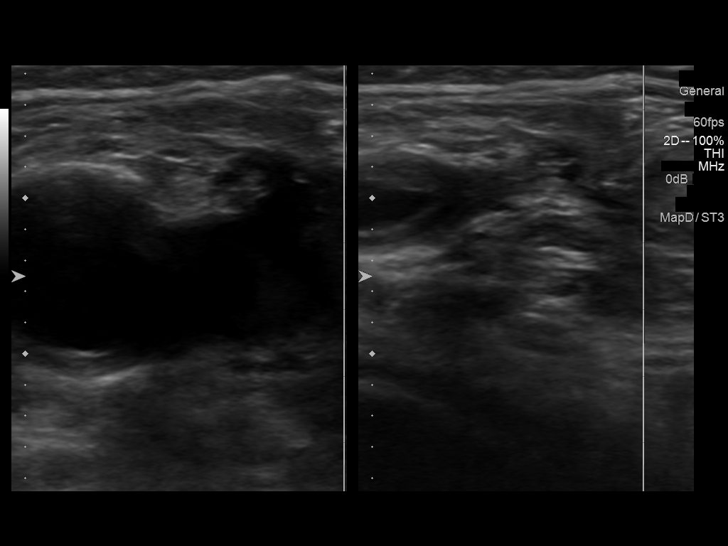
[im 6/69]
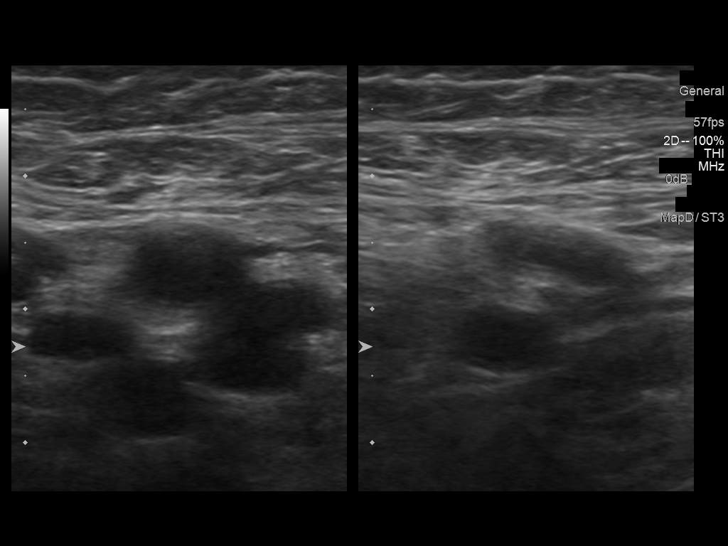
[im 12/69]
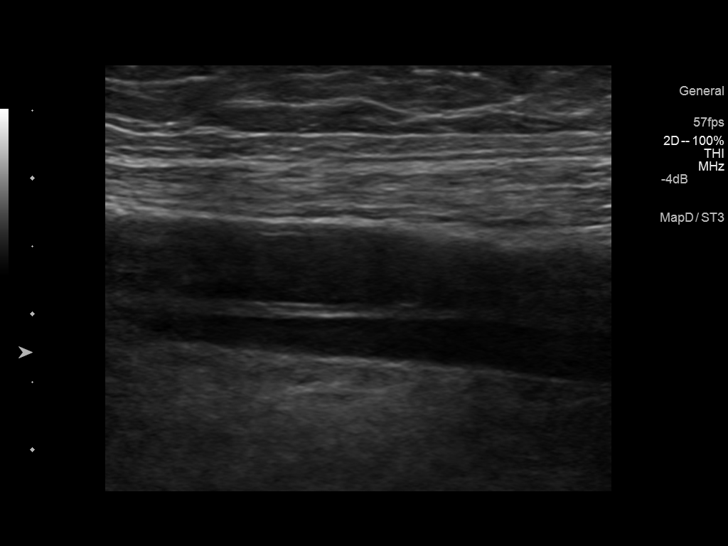
[im 18/69]
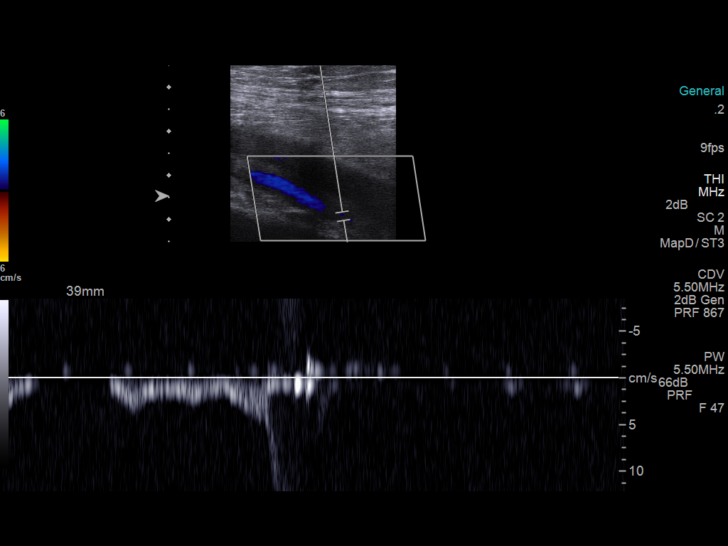
[im 21/69]
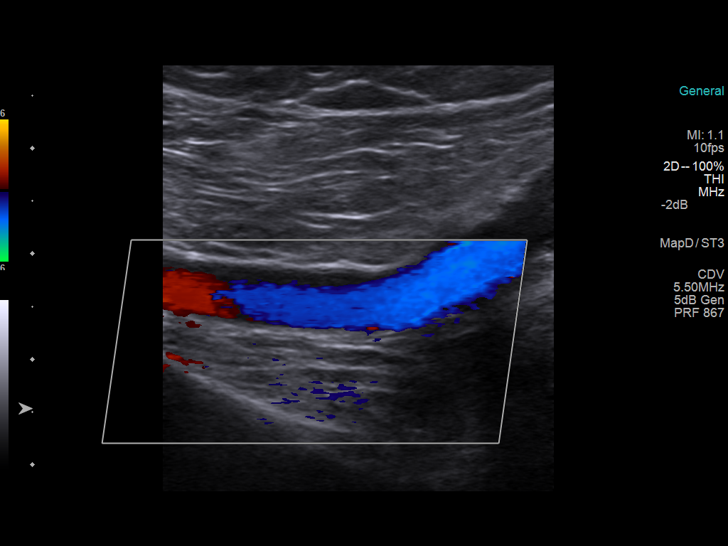
[im 27/69]
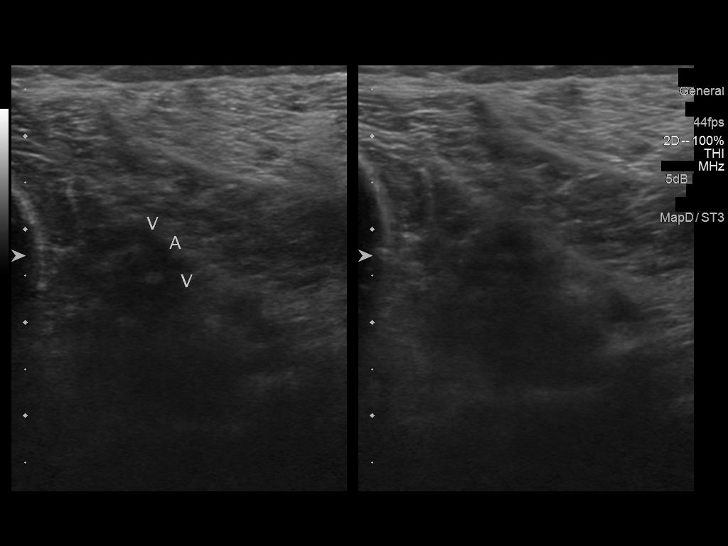
[im 33/69]
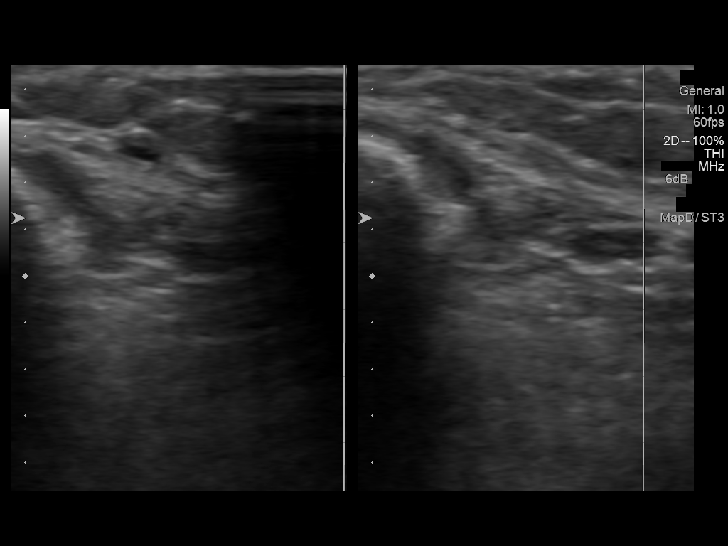
[im 36/69]
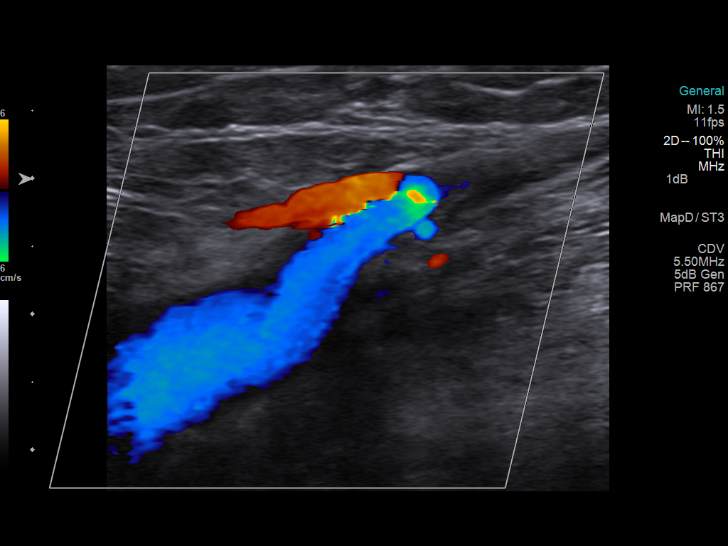
[im 42/69]
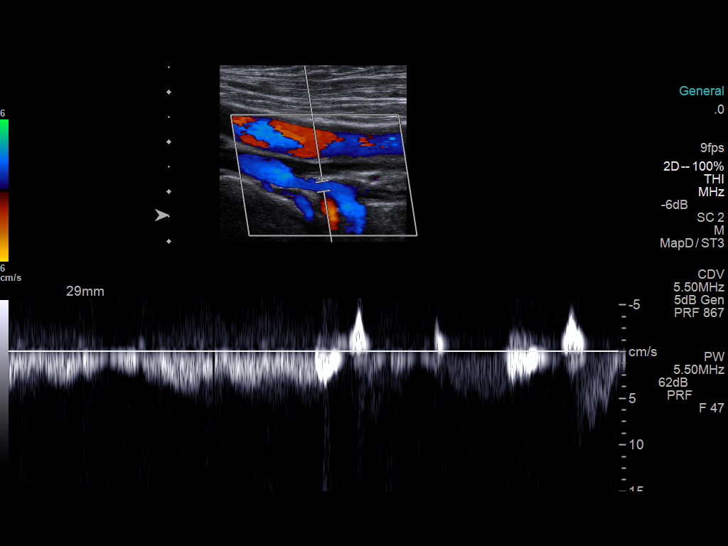
[im 48/69]
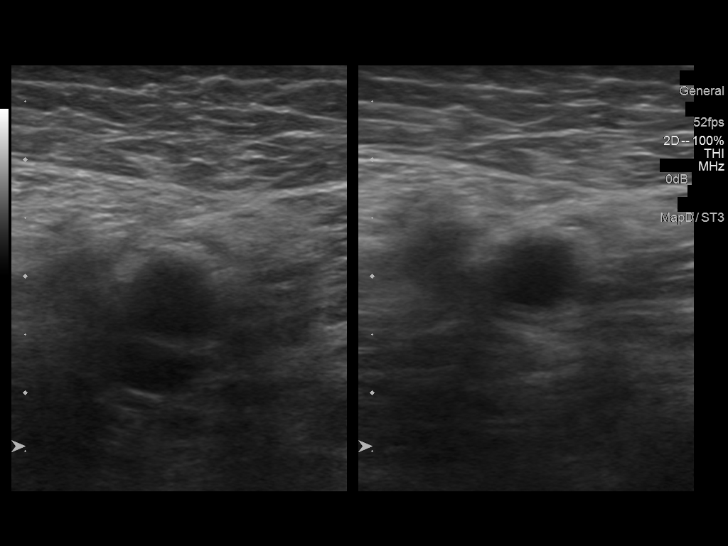
[im 54/69]
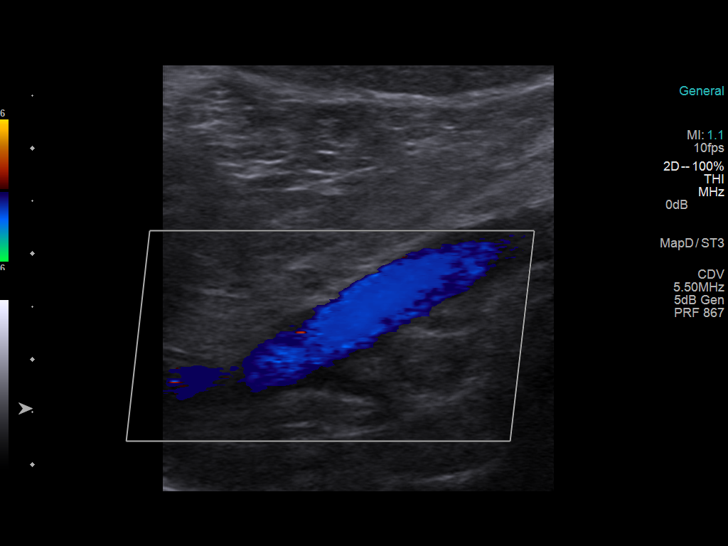
[im 57/69]
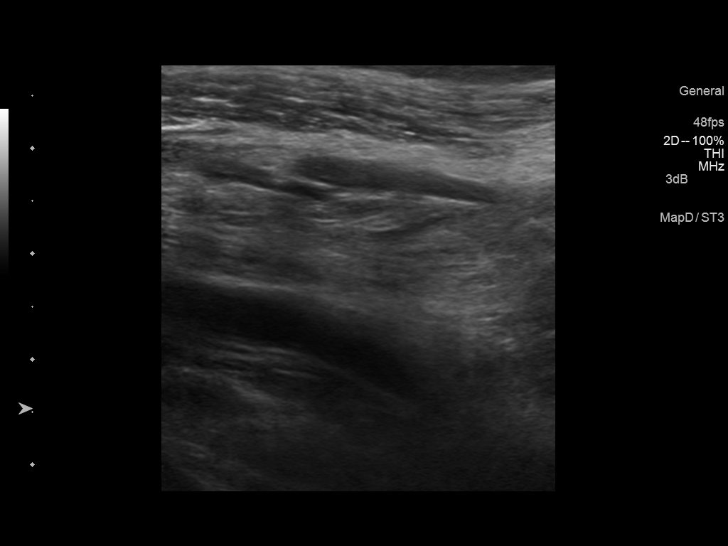
[im 63/69]
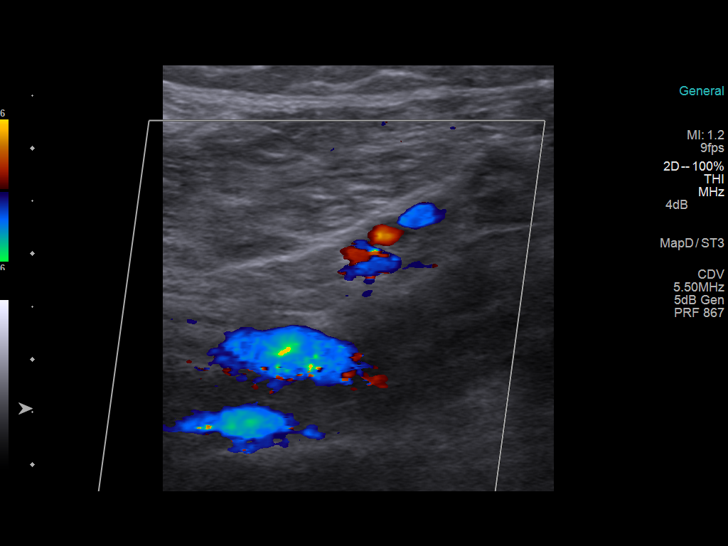
[im 69/69]
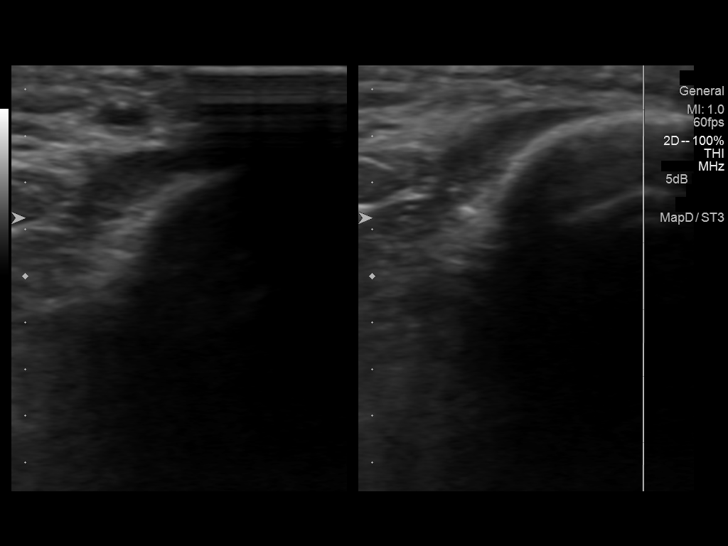

[14 of 24 positions shown; findings below may reference images not displayed]

FINDINGS: Thrombus within deep veins:  None visualized.

Compressibility of deep veins:  Normal.

Duplex waveform respiratory phasicity:  Normal.

Duplex waveform response to augmentation:  Normal.

Venous reflux:  None visualized.

Other findings:  None visualized.
IMPRESSION: Negative for DVT

## 2015-10-20 IMAGING — CR DG CHEST 1V
1 series · 1 of 1 positions shown · non-contrast
Comparison: DG CHEST 1V PORT dated 12/13/2013 Mild cardiac
enlargement stable.

CLINICAL DATA: Back pain weakness shortness of breath

EXAM:
CHEST - 1 VIEW

[ap]
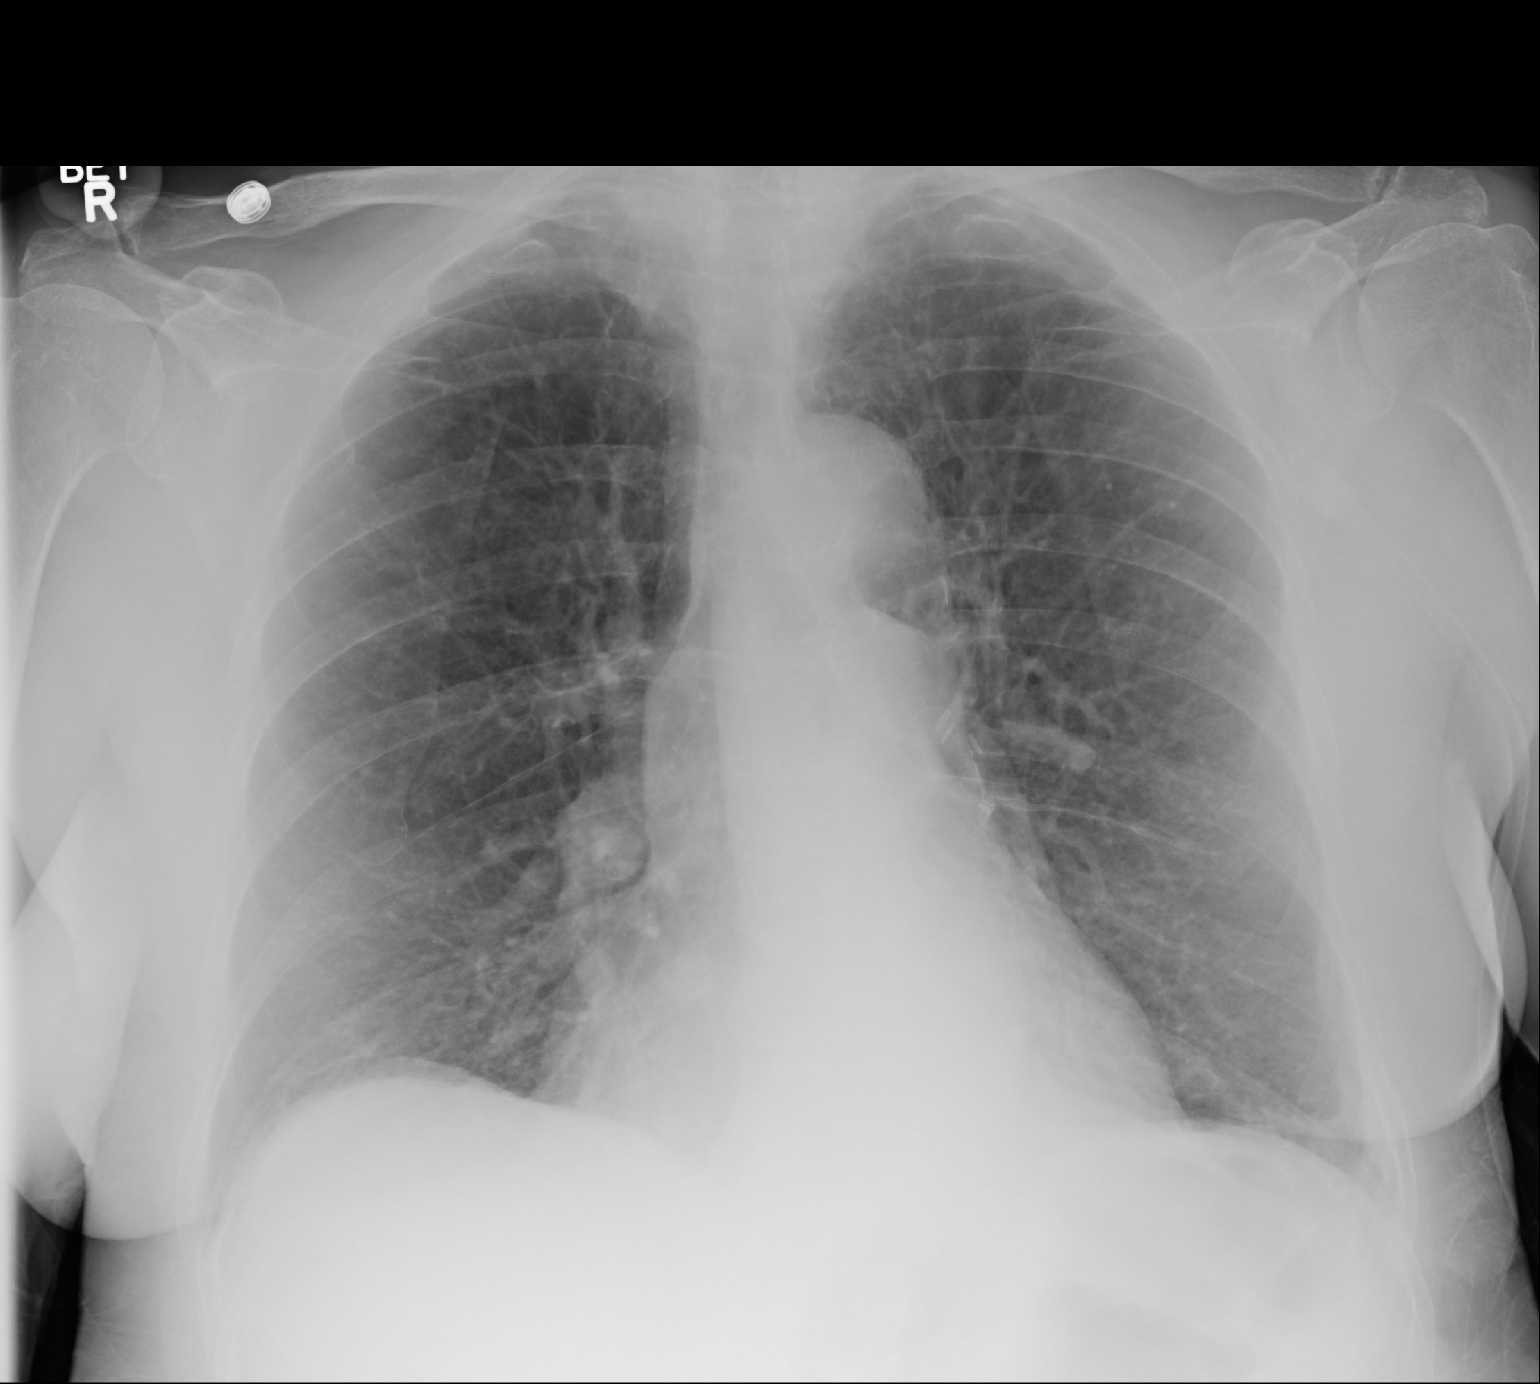

[1 of 1 positions shown; findings below may reference images not displayed]

FINDINGS: Mild background interstitial change stable. No consolidation or
effusion.
IMPRESSION: No acute findings.  Mild chronic interstitial lung disease.

## 2015-10-21 ENCOUNTER — Other Ambulatory Visit: Payer: PPO

## 2015-10-21 DIAGNOSIS — E871 Hypo-osmolality and hyponatremia: Secondary | ICD-10-CM | POA: Diagnosis not present

## 2015-10-21 LAB — BASIC METABOLIC PANEL
BUN: 10 mg/dL (ref 7–25)
CO2: 30 mmol/L (ref 20–31)
Calcium: 9.9 mg/dL (ref 8.6–10.4)
Chloride: 95 mmol/L — ABNORMAL LOW (ref 98–110)
Creat: 0.5 mg/dL — ABNORMAL LOW (ref 0.60–0.93)
Glucose, Bld: 76 mg/dL (ref 70–99)
Potassium: 4.7 mmol/L (ref 3.5–5.3)
Sodium: 133 mmol/L — ABNORMAL LOW (ref 135–146)

## 2015-10-24 ENCOUNTER — Encounter: Payer: Self-pay | Admitting: Family Medicine

## 2015-10-31 IMAGING — MR MR LUMBAR SPINE W/O CM
4 of 5 series · 11 of 48 positions shown · non-contrast
Comparison: DG LUMBAR SPINE COMPLETE dated 12/12/2013; CT ABDOMEN W/O
CM dated 01/24/2012

CLINICAL DATA: Recent fall, low back pain.

EXAM:
MRI LUMBAR SPINE WITHOUT CONTRAST
TECHNIQUE: Multiplanar, multisequence MR imaging was performed. No intravenous
contrast was administered.

[Series 3: T2 · sagittal · 4.0mm · 0.51mm/px · 3 of 15 slices shown (1 of 3)]
[im 3/15]
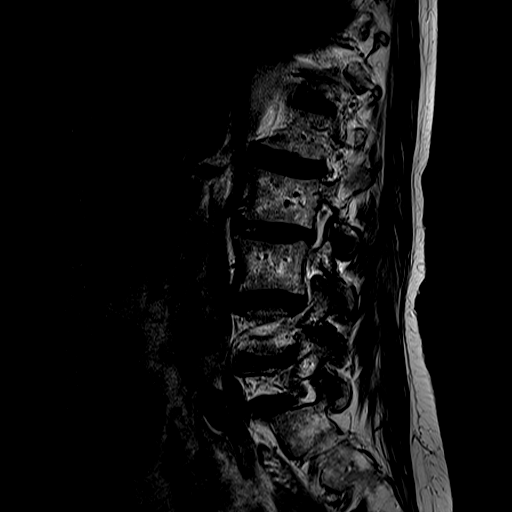
[im 9/15]
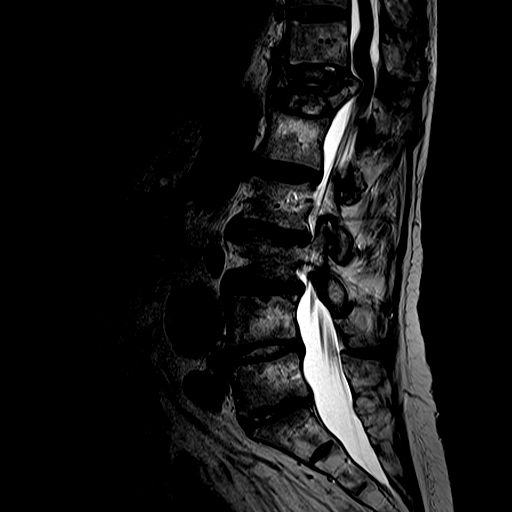
[im 15/15]
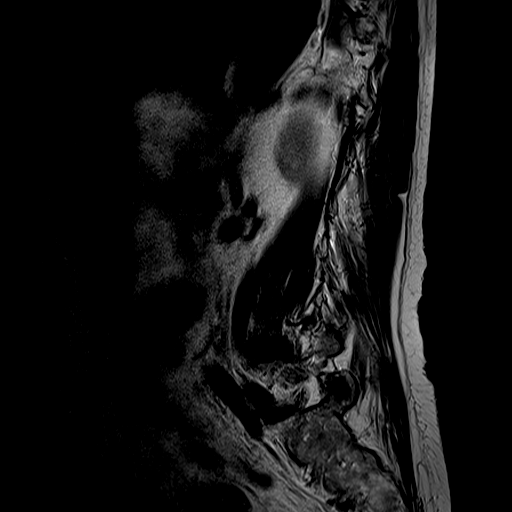

[Series 4: T1 · sagittal · 4.0mm · 0.32mm/px · 2 of 15 slices shown]
[im 1/15]
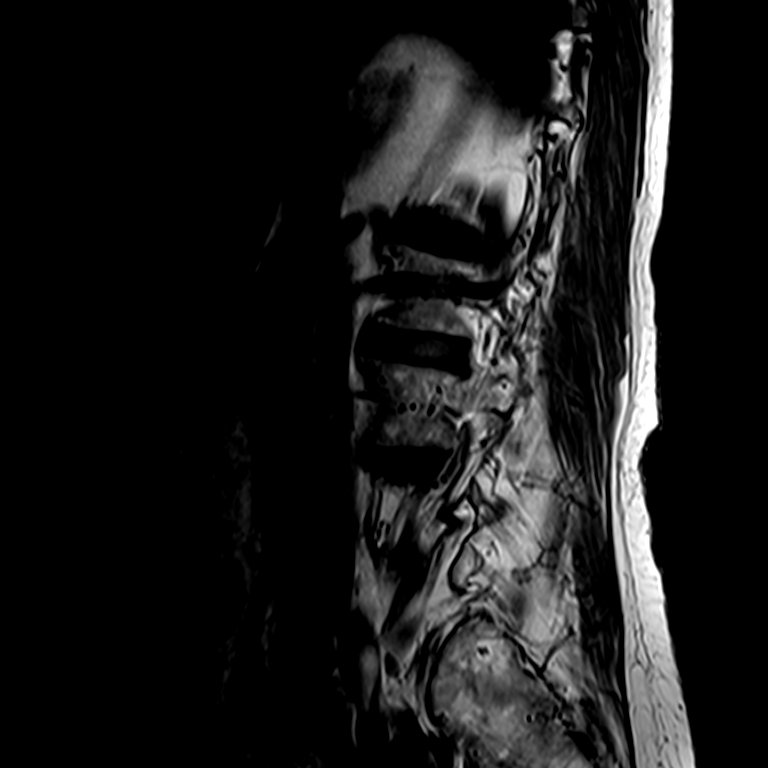
[im 8/15]
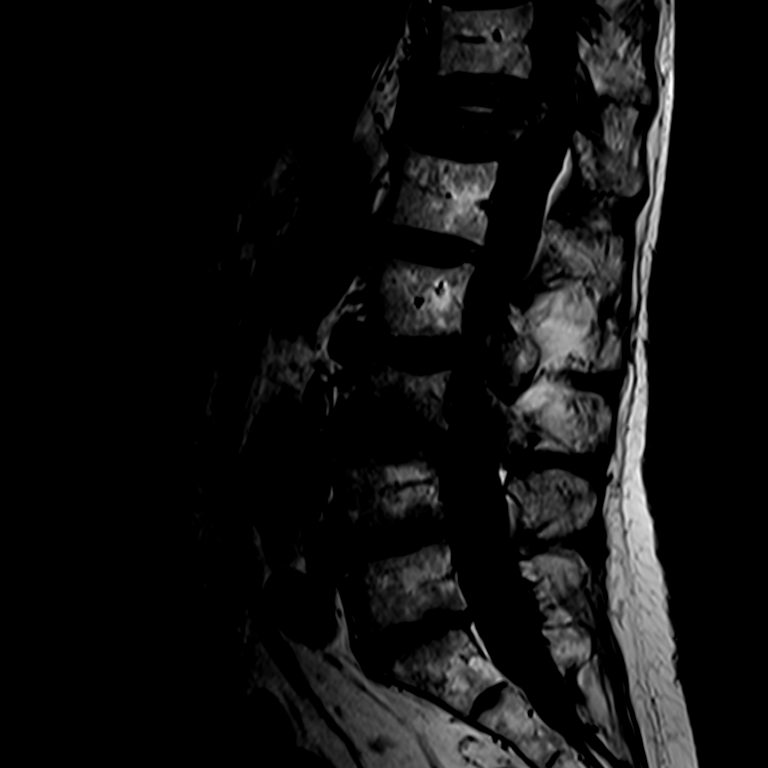

[Series 5: T2 · sagittal · 4.0mm · 0.41mm/px · 3 of 20 slices shown (2 of 3)]
[im 4/20]
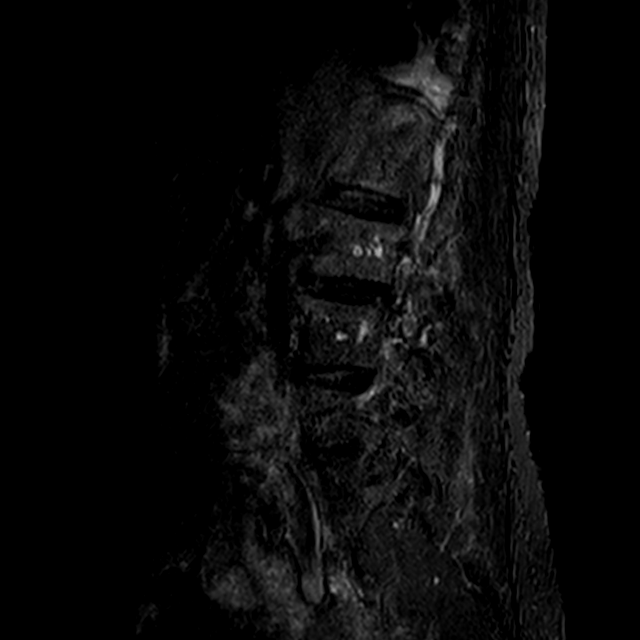
[im 10/20]
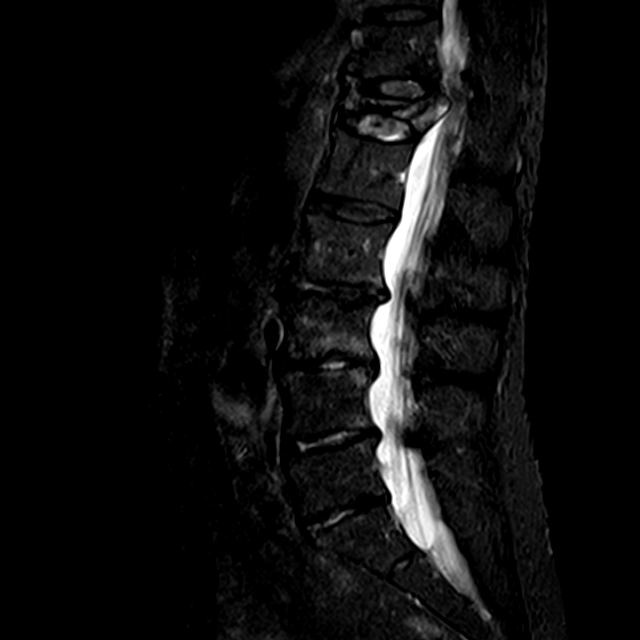
[im 16/20]
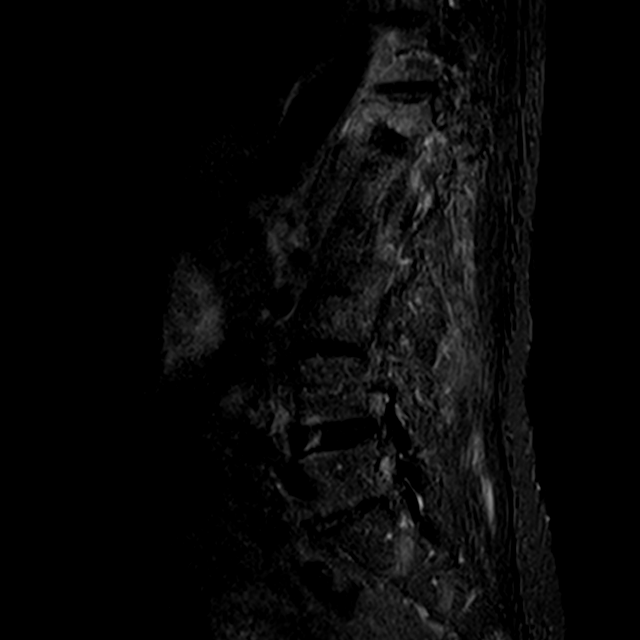

[Series 6: T2 · axial · 4.0mm · 0.25mm/px · z∈[-34,+117]mm · 3 of 44 slices shown (3 of 3)]
[im 7/44]
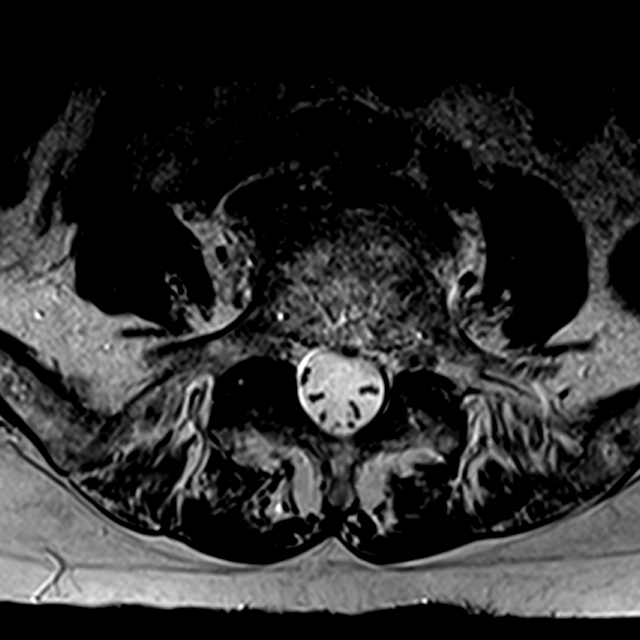
[im 22/44]
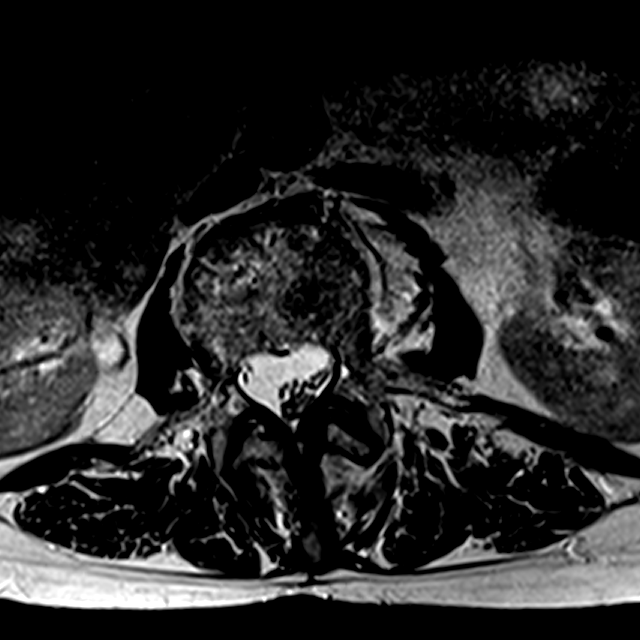
[im 37/44]
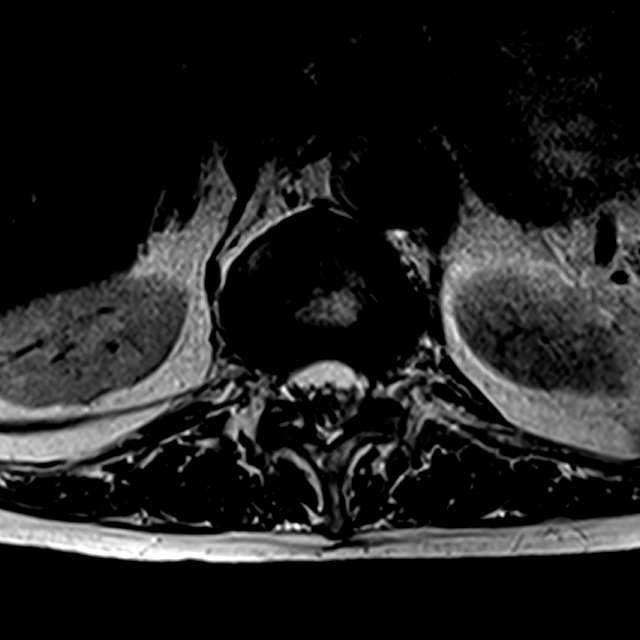

[11 of 48 positions shown; findings below may reference images not displayed]

FINDINGS: Using the reference level of the last well-formed intervertebral
disc as L5-S1, severe T12 burst fracture as seen on prior lumbar
radiograph demonstrates intermediate to low T1 signal, intermediate
to bright T2 signal suggesting acute to subacute fracture.
Retropulsed bony fragments result in conus medullaris deformity
without MR findings of cord edema or syrinx. No malalignment. Mid
lumbar dextroscoliosis inferred on the axial images.

Low T1 signal within the inferior endplate of L3 towards the left
may reflect scratch acute discogenic endplate changes. Moderate to
severe L3-4 degenerative disc disease, moderate L2-3 and L5-S1
degenerative disc disease. Moderate chronic discogenic endplate
change at L2-3, mild at L4-5 and L5-S1.

Cauda equina is nonsuspicious. Infrarenal aortic aneurysm measures 3
x 3.5 cm (AP by transverse). Aneurysm appears to extend into the
right Common iliac artery. Small hiatal hernia Moderate paraspinal
muscle atrophy.

Level by level evaluation:

T11 12: 7 mm retropulsed bony fragments result in moderate canal
stenosis, conus medullaris deformity. Lateral recess effacement may
affect the traversing L1 nerves. Mild neural foraminal narrowing.

T12-L1: No disc bulge. Mild facet arthropathy without canal stenosis
or neural foraminal narrowing. T2 bright facet effusions are likely
reactive.

L1-2: Annular bulging, mild facet arthropathy without canal stenosis
or neural foraminal narrowing.

L2-3: 4 mm broad-based disc bulge asymmetric to the left. Minimal
annular fissure. Mild facet arthropathy and ligamentum flavum
redundancy without canal stenosis though slight left lateral recess
effacement may affect the traversing left L3 nerve. No neural
foraminal narrowing.

L3-4: 3 mm broad-based disc bulge, moderate facet arthropathy and
ligamentum flavum redundancy. Mild canal stenosis including partial
effacement of the right lateral recess which may affect the
traversing right L4 nerve. Moderate right, minimal left neural
foraminal narrowing.

L4-5: 2 mm broad-based disc bulge with superimposed moderate right
subarticular to extra foraminal broad-based disc protrusion which
appears to encroach upon the exited right L4 nerve. Moderate to
severe right, mild to moderate left facet arthropathy with
ligamentum flavum redundancy. No canal stenosis. Moderate to severe
right, mild left neural foraminal narrowing.

L5-S1: Approximate 3 x 3 mm left central disc extrusion with 3 mm of
subligamentous superior extent, this may reflect free fragment,
sagittal [DATE]. Disc material appears to contact the traversing left
S1 nerve. No canal stenosis. Moderate facet arthropathy. Mild right
neural foraminal narrowing.
IMPRESSION: Acute to subacute severe L1 burst fracture with 7 mm retropulsed
bony fragments resulting in moderate canal stenosis and conus
medullaris deformity without convincing evidence of spinal cord
edema.

No malalignment. Dextroscoliosis on this nonweightbearing
examination.

Degenerative change of the lumbar spine. Multilevel canal stenosis,
worst at T11-12. Neural foraminal narrowing T11-12, L3-4 through
L5-S1: Moderate to severe on the right at L4-5 associated with
moderate disc protrusion.

Apparent 3 x 3 mm left central L5-S1 disc extrusion may reflect free
fragment and, could affect the traversing left S1 nerve.

3 x 3.5 cm infrarenal aortic aneurysm appears to extend to the right
Common iliac artery, recommend abdominal aortic ultrasound for
further characterization.

These results will be called to the ordering clinician or
representative by the Radiologist Assistant, and communication
documented in the PACS Dashboard.

  By: Edones Capuder

## 2015-11-04 ENCOUNTER — Encounter: Payer: Self-pay | Admitting: Family Medicine

## 2015-11-04 ENCOUNTER — Encounter (HOSPITAL_COMMUNITY): Payer: Self-pay | Admitting: Hematology & Oncology

## 2015-11-05 DIAGNOSIS — C3490 Malignant neoplasm of unspecified part of unspecified bronchus or lung: Secondary | ICD-10-CM | POA: Diagnosis not present

## 2015-11-05 DIAGNOSIS — J9621 Acute and chronic respiratory failure with hypoxia: Secondary | ICD-10-CM | POA: Diagnosis not present

## 2015-11-05 DIAGNOSIS — R0902 Hypoxemia: Secondary | ICD-10-CM | POA: Diagnosis not present

## 2015-11-05 DIAGNOSIS — R0602 Shortness of breath: Secondary | ICD-10-CM | POA: Diagnosis not present

## 2015-11-14 ENCOUNTER — Encounter (HOSPITAL_COMMUNITY): Payer: PPO | Attending: Hematology & Oncology | Admitting: Hematology & Oncology

## 2015-11-14 ENCOUNTER — Other Ambulatory Visit (HOSPITAL_COMMUNITY): Payer: Self-pay | Admitting: Oncology

## 2015-11-14 ENCOUNTER — Encounter (HOSPITAL_COMMUNITY): Payer: Self-pay | Admitting: Hematology & Oncology

## 2015-11-14 VITALS — BP 126/81 | HR 65 | Temp 97.8°F | Resp 16 | Wt 135.6 lb

## 2015-11-14 DIAGNOSIS — C3432 Malignant neoplasm of lower lobe, left bronchus or lung: Secondary | ICD-10-CM | POA: Diagnosis not present

## 2015-11-14 DIAGNOSIS — J432 Centrilobular emphysema: Secondary | ICD-10-CM

## 2015-11-14 DIAGNOSIS — C3492 Malignant neoplasm of unspecified part of left bronchus or lung: Secondary | ICD-10-CM

## 2015-11-14 NOTE — Progress Notes (Signed)
[name], MD [place] [id number]  No diagnosis found.  CURRENT THERAPY: Surveillance per NCCN guidelines  INTERVAL HISTORY: [name] 75 y.o. female returns for followup of Stage IB bronchoalveolar carcinoma,. Well-differentiated the left lower lobe status post resection on 12/21/2010. She had 14 lymph nodes negative and no perineural space invasion with clear margins and no LV I. Thus far she has no evidence for recurrent disease or new disease.      Adenocarcinoma of lung ([place])   12/21/2010 Initial Diagnosis S/P left lower lobe resection for a well-differentiated Stage IB bronchoalveolar cancer. 0/14 lymph nodes.  No perineural invasion or LVI.  negative margins.    Oncologically, she denies any complaints and ROS questioning is negative.   [name] is here alone and ambulates with the assistance of a walker. I personally reviewed and discussed imaging studies with the patient, last CT was in August of last year. She will be due for another CT this upcoming August.   She has been feeling and eating well. She is afraid to walk without her walker until she is much stronger. She does not see a pulmonologist, only her PCP Dr. Dennard Schaumann. She continues to use oxygen at home, noting that she is able to go periods of time without it. She does still have episodes of shortness of breath. She does not walk outside because she lives in an apartment complex, instead she walks on a glider 1 mile/day. Denies current bowel issues, however she does admit some bowel problems when taking antibiotics with prior pneumonia.  Her last mammogram was performed on 08/01/15.   Past Medical History  Diagnosis Date  . COPD (chronic obstructive pulmonary disease) ([place])   . Anxiety disorder   . Hypothyroidism   . PUD (peptic ulcer disease)     85yr  . Dysphagia   . IBS (irritable bowel syndrome)   . Diverticulosis   . Trigeminal neuralgia   . Diastolic  dysfunction   . Allergic rhinitis   . Adenocarcinoma of lung ([place] 12/2010    left lung/surg only  . Hiatal hernia   . Aortic aneurysm ([place]   . Complication of anesthesia 2011    bloodpressure dropped during colonoscopy, not problems since  . Shortness of breath     with exertion  . 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 Compression fracture   . On home O2 12/15/2013    chronic hypoxia  . Depression   . Emphysema lung ([facility] 11/08/2014    has CONTRAST DYE ALLERGY; Epigastric pain; Esophageal dysphagia; Trigeminal neuralgia; Adenocarcinoma of lung ([place]; Dyspepsia; Aneurysm ([place]; Abdominal aneurysm without mention of rupture; 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[place]; Hypoxia; Diastolic dysfunction; Compression fracture; Constipation; Acute respiratory failure with hypoxia ([facility]; Hyponatremia; COPD (chronic obstructive pulmonary disease) ([place]; Compression fracture of thoracic vertebra ([place]; T12 burst fracture ([place]; Occlusion and stenosis of carotid artery without mention of cerebral infarction; Wrist fracture; Acute on chronic respiratory failure with hypoxia ([facility]; Helicobacter pylori gastritis; Emphysema lung ([place]; Change in bowel habits; Abdominal pain, left lower quadrant; and HLD (hyperlipidemia) on her problem list.     is allergic to ciprofloxacin hcl; iohexol; prozac; and sulfonamide derivatives.  Ms. BAguirredoes not currently have medications on file.  Past Surgical History  Procedure Laterality Date  . 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 Esophagogastroduodenoscopy  11/13/2009  w/dilation to 34m, gastric ulceration (H.Pylori) s/p treatment  . Esophagogastroduodenoscopy  11/21/2009    distal esophageal web, gastritis  . Lung cancer surgery  12/2010    Left VATS, minithoracotomy, LLL superior segmentectomy  . Vocal cord surgery  02/06/2011    laryngoscopy with bilateral vocal cord Radiesse injection for vocal cord paralysis    . Abdominal hysterectomy    . Cholecystectomy    . Tubal ligation    . Cataract extraction    . Fatty tumor removal from lt groin    . Cataract extraction w/phaco  09/02/2012    Procedure: CATARACT EXTRACTION PHACO AND INTRAOCULAR LENS PLACEMENT (IOC);  Surgeon: MElta GuadeloupeT. SGershon Crane MD;  [facility]: [facility];  Service: Ophthalmology;  Laterality: Left;  CDE:10.35  . Endarterectomy Right 08/21/2013    Procedure: RIGHT CAROTID ANEURYSM RESECTION;  Surgeon: TRosetta Posner MD;  [facility];  Service: Vascular;  Laterality: Right;  . Fracture surgery Left     hand and left ring finger  . Orif wrist fracture Left 04/09/2014    Procedure: OPEN REDUCTION INTERNAL FIXATION (ORIF) WRIST FRACTURE;  Surgeon: TRenette Butters MD;  [facility]  Service: Orthopedics;  Laterality: Left;  . Percutaneous pinning Left 04/09/2014    Procedure: PERCUTANEOUS PINNING EXTREMITY;  Surgeon: TRenette Butters MD;  [facility]  Service: Orthopedics;  Laterality: Left;  . Esophagogastroduodenoscopy  03/14/12    SDGU:YQIHKVQQVin the distal esophagus/Mild gastritis/small HH. + H.pylori, prescribed Pylera. Finished treatment.   . Back surgery  January 13, 2014  . Colonoscopy N/A 11/23/2014    Procedure: COLONOSCOPY;  Surgeon: SDanie Binder MD;  [facility];  Service: Endoscopy;  Laterality: N/A;  930   . Yag laser application Left [id number]   Procedure: YAG LASER APPLICATION;  Surgeon: MRutherford Guys MD;  [facility];  Service: Ophthalmology;  Laterality: Left;    Denies any headaches, dizziness, double vision, fevers, chills, night sweats, nausea, vomiting, diarrhea, constipation, chest pain, heart palpitations, shortness of breath, blood in stool, black tarry stool, urinary pain, urinary burning, urinary frequency, hematuria. 14 point review of systems was performed and is negative except as detailed under history of present illness and above    PHYSICAL EXAMINATION  ECOG PERFORMANCE STATUS: 1 -  Symptomatic but completely ambulatory  Filed Vitals:   11/14/15 1214  BP: 126/81  Pulse: 65  Temp: 97.8 F (36.6 C)  Resp: 16   GENERAL:alert, no distress, well nourished, well developed, comfortable, cooperative and smiling, [place] in place giving O2. SKIN: skin color, texture, turgor are normal, no rashes or significant lesions HEAD: Normocephalic, No masses, lesions, tenderness or abnormalities EYES: normal, PERRLA, EOMI, Conjunctiva are pink and non-injected EARS: External ears normal OROPHARYNX:lips, buccal mucosa, and tongue normal and mucous membranes are moist  NECK: supple, thyroid normal size, non-tender, without nodularity, no stridor, non-tender, trachea midline LYMPH:  No palpable adenopathy in the neck or supraclavicular regions LUNGS: clear to auscultation  HEART: regular rate & rhythm, no murmurs and no gallops ABDOMEN:abdomen soft and normal bowel sounds BACK: Back symmetric, no curvature. EXTREMITIES:less then 2 second capillary refill, no joint deformities, effusion, or inflammation, no cyanosis  NEURO: alert & oriented x 3 with fluent speech, no focal motor/sensory deficits, gait normal with a rolling walker for stability.   LABORATORY DATA: I have reviewed the data as listed. CBC    Component Value Date/Time   WBC 5.6 06/01/2015 0823   RBC 4.40 06/01/2015 0823  HGB 12.9 06/01/2015 0823   HCT 37.6 06/01/2015 0823   PLT 289 06/01/2015 0823   MCV 85.5 06/01/2015 0823   MCH 29.3 06/01/2015 0823   MCHC 34.3 06/01/2015 0823   RDW 12.4 06/01/2015 0823   LYMPHSABS 1.4 10/25/2014 1127   MONOABS 0.5 10/25/2014 1127   EOSABS 0.3 10/25/2014 1127   BASOSABS 0.1 10/25/2014 1127      Chemistry      Component Value Date/Time   NA 133* 10/21/2015 1247   K 4.7 10/21/2015 1247   CL 95* 10/21/2015 1247   CO2 30 10/21/2015 1247   BUN 10 10/21/2015 1247   CREATININE 0.50* 10/21/2015 1247   CREATININE 0.65 06/01/2015 0823      Component Value Date/Time   CALCIUM  9.9 10/21/2015 1247   ALKPHOS 77 09/20/2015 0956   AST 18 09/20/2015 0956   ALT 15 09/20/2015 0956   BILITOT 0.5 09/20/2015 0956       RADIOGRAPHIC STUDIES: I have personally reviewed the radiological images as listed and agreed with the findings in the report.  Study Result     CLINICAL DATA: Screening.  EXAM: DIGITAL SCREENING BILATERAL MAMMOGRAM WITH 3D TOMO WITH CAD  COMPARISON: Previous exam(s).  ACR Breast Density Category c: The breast tissue is heterogeneously dense, which may obscure small masses.  FINDINGS: There are no findings suspicious for malignancy. Images were processed with CAD.  IMPRESSION: No mammographic evidence of malignancy. A result letter of this screening mammogram will be mailed directly to the patient.  RECOMMENDATION: Screening mammogram in one year. (Code:SM-B-01Y)  BI-RADS CATEGORY 1: Negative.   Electronically Signed  By: Lovey Newcomer M.D.  On: 08/04/2015 10:50    Study Result     CLINICAL DATA: Right-sided pleuritic type chest pain. History of previous lung carcinoma  EXAM: CHEST 2 VIEW  COMPARISON: June 07, 2015  FINDINGS: There is scarring in the lung bases which is stable. There is no edema or consolidation. There is postoperative change on the left with evidence of previous left lower lobectomy. No new opacity. Heart size and pulmonary vascularity are normal. No adenopathy. There is postoperative change at multiple levels in the lower thoracic and visualized upper lumbar region. There is marked collapse of the T12 vertebral body, stable. There is moderate anterior wedging of the T8 vertebral body, stable. No pneumothorax.  IMPRESSION: Stable bibasilar lung scarring. No edema or consolidation. No change in cardiac silhouette.   Electronically Signed  By: Lowella Grip III M.D.  On: 08/01/2015 15:40   CLINICAL DATA: 75 year old female former smoker with a history of lung  cancer (left lower lobe adenocarcinoma) diagnosed in 2012, presenting for restaging. Nodular pleural parenchymal thickening at the left lung base on prior CT study.  EXAM: CT CHEST WITHOUT CONTRAST  TECHNIQUE: Multidetector CT imaging of the chest was performed following the standard protocol without IV contrast.  COMPARISON: Most recent chest CT from 10/28/2014. Chest radiograph from 03/14/2015.  IMPRESSION: 1. Status post left lower lobe wedge resection. Benign scarring in the medial/basilar left lower lobe. No evidence of local tumor recurrence or metastatic disease in the chest. 2. Irregular 0.5 cm superior segment right lower lobe pulmonary nodule, stable since at least 10/31/2012. 3. Moderate to severe centrilobular emphysema.   Electronically Signed  By: Ilona Sorrel M.D.  On: 05/09/2015 12:19   ASSESSMENT AND PLAN:  Stage IB bronchoalveolar carcinoma,. Well-differentiated the left lower lobe status post resection on 12/21/2010. Former smoker Centrilobular emphysema   I reviewed her CT imaging  with her. I advised her that there is no evidence of recurrent disease. She is aware that she has COPD. I have recommended return for ongoing observation and physical exam in 6 months to which she is agreeable. She will be due for repeat CT imaging in August. We have ordered this for her today. She will follow-up with [place] post imaging to review.   NCCN guidelines for Non-Small Cell Lung Cancer Surveillance are as follows:  A. H+P every 6-12 months and CT of chest every 6 months for the first two years  B. Low-dose spiral CT chest annually after first two years of surveillance CTs   C. PET scan not typically indicated for surveillance  D. Smoking cessation advice, counseling, and pharmacotherapy  All questions were answered. The patient knows to call the clinic with any problems, questions or concerns. We can certainly see the patient much sooner if necessary.   This  document serves as a record of services personally performed by Ancil Linsey, MD. It was created on her behalf by Arlyce Harman, a trained medical scribe. The creation of this record is based on the scribe's personal observations and the provider's statements to them. This document has been checked and approved by the attending provider.  I have reviewed the above documentation for accuracy and completeness, and I agree with the above.  This note was electronically signed.  Kelby Fam. Whitney Muse, MD

## 2015-11-14 NOTE — Patient Instructions (Addendum)
[place] at [facility] Discharge Instructions  RECOMMENDATIONS MADE BY THE CONSULTANT AND ANY TEST RESULTS WILL BE SENT TO YOUR REFERRING PHYSICIAN.   Exam and discussion by Dr Whitney Muse today Chest CT scan scheduled do not need this until august. You have a stable small stable nodule in the right lower lung. Return to see the doctor after scans in august  Please call the clinic if you have any questions or concerns    Thank you for choosing [place] at [facility] to provide your oncology and hematology care.  To afford each patient quality time with our provider, please arrive at least 15 minutes before your scheduled appointment time.   Beginning January 23rd 2017 lab work for the [facility] will be done in the  Main lab at [facility] on 1st floor. If you have a lab appointment with the [facility] please come in thru the  Main Entrance and check in at the main information desk  You need to re-schedule your appointment should you arrive 10 or more minutes late.  We strive to give you quality time with our providers, and arriving late affects you and other patients whose appointments are after yours.  Also, if you no show three or more times for appointments you may be dismissed from the clinic at the providers discretion.     Again, thank you for choosing [facility].  Our hope is that these requests will decrease the amount of time that you wait before being seen by our physicians.       _____________________________________________________________  Should you have questions after your visit to [facility], please contact our office at [phone] between the hours of 8:30 a.m. and 4:30 p.m.  Voicemails left after 4:30 p.m. will not be returned until the following business day.  For prescription refill requests, have your pharmacy contact our office.

## 2015-11-16 IMAGING — CT CT T SPINE W/O CM
4 of 5 series · 17 of 34 positions shown, 19 images · non-contrast
Comparison: CT chest 10/26/2013, radiographs 12/31/2013

CLINICAL DATA: T12 burst fracture. Back pain. Fall 4 weeks ago.
History of lung cancer.

EXAM:
CT THORACIC SPINE WITHOUT CONTRAST
TECHNIQUE: Multidetector CT imaging of the thoracic spine was performed without
intravenous contrast administration. Multiplanar CT image
reconstructions were also generated.

[Series 3: t spine bone · axial · 0.32mm/px · z∈[-270,-68]mm · 5 of 123 slices shown, 7 images]
[im 21/123  soft-tissue]
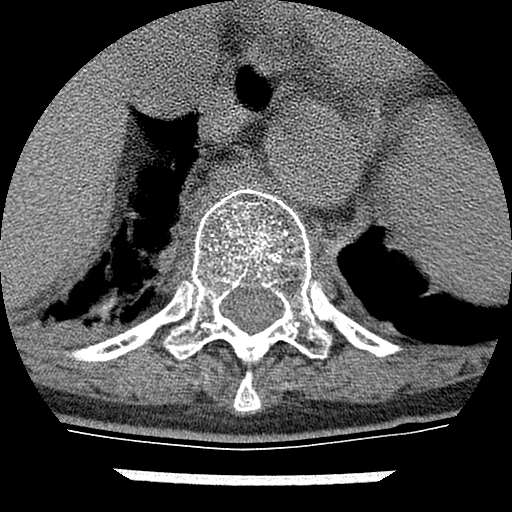
[im 21/123  bone]
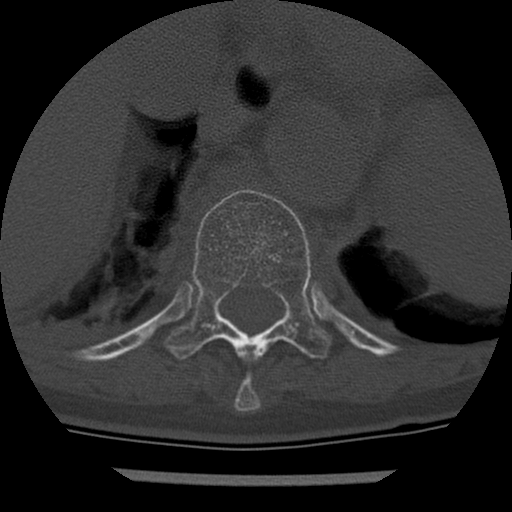
[im 41/123  bone]
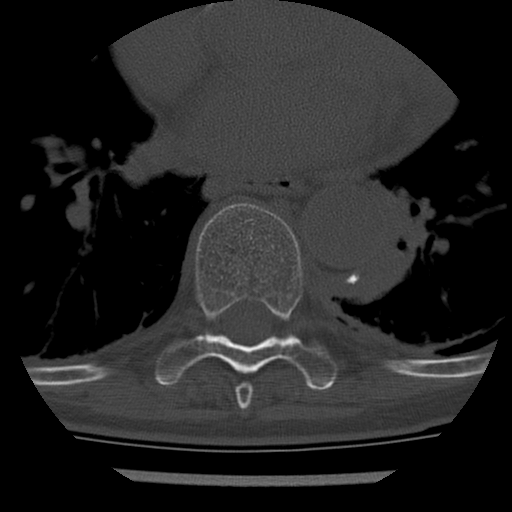
[im 62/123  bone]
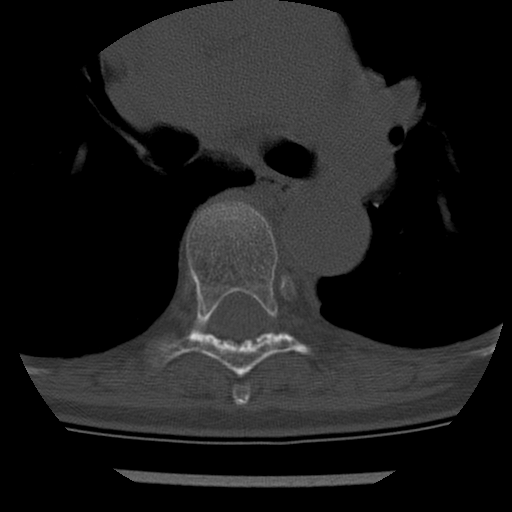
[im 82/123  bone]
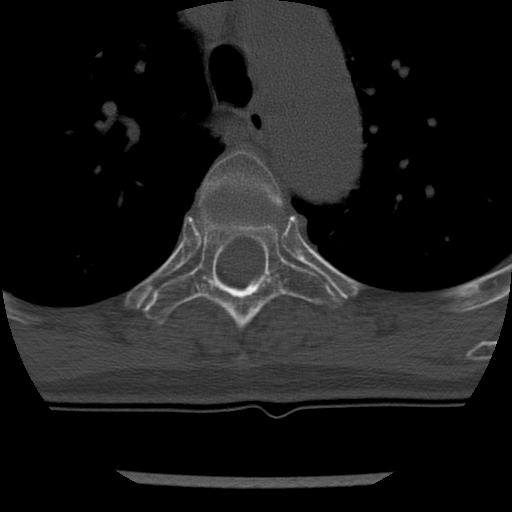
[im 102/123  soft-tissue]
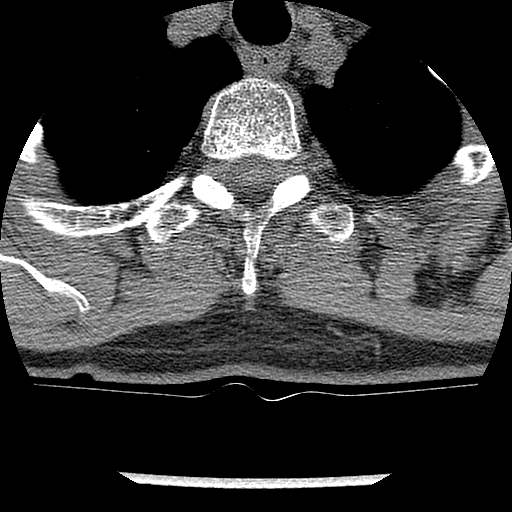
[im 102/123  bone]
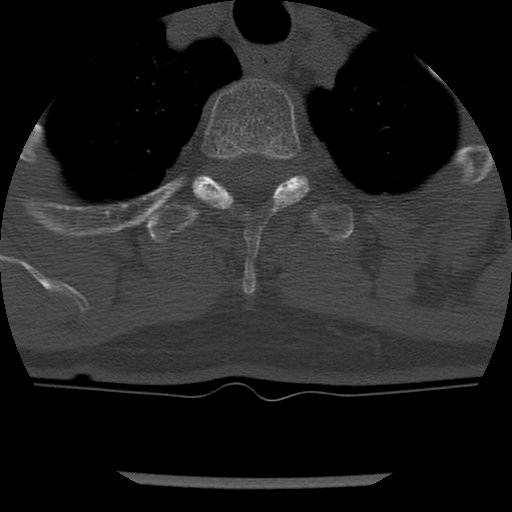

[Series 4: t spine soft · axial · 0.32mm/px · z∈[-270,-168]mm · 3 of 123 slices shown]
[im 21/123  soft-tissue]
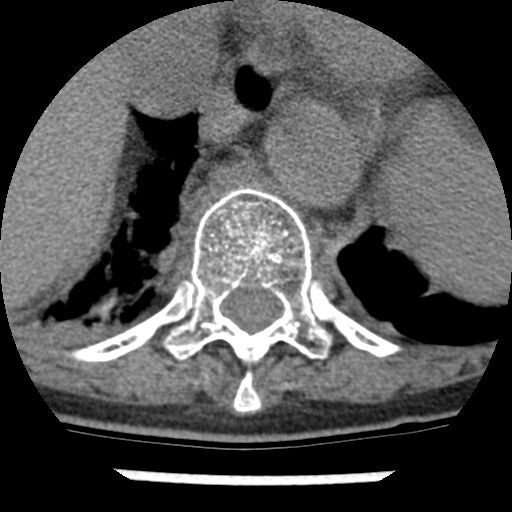
[im 41/123  soft-tissue]
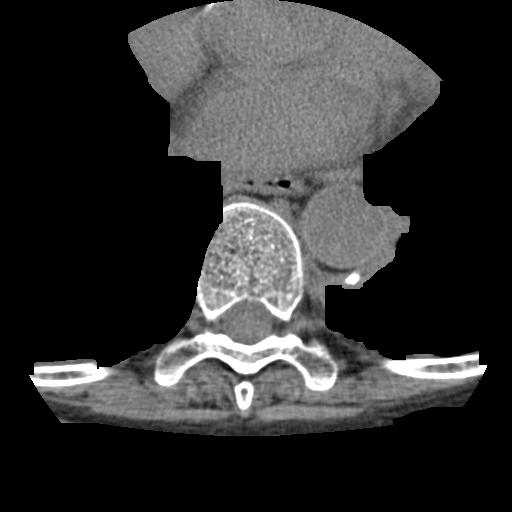
[im 62/123  soft-tissue]
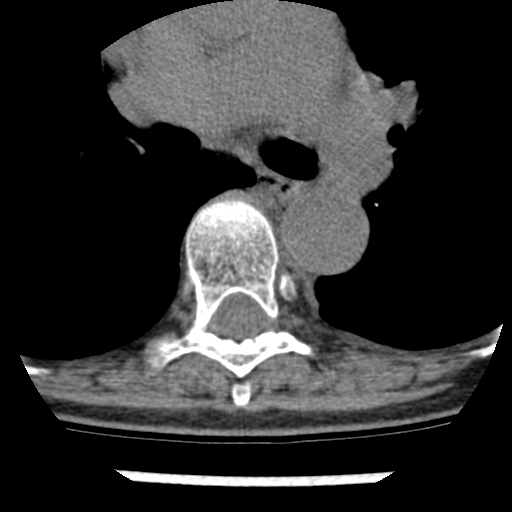

[Series 200: cor · coronal · 0.61mm/px · 3 of 74 slices shown]
[im 15/74  bone]
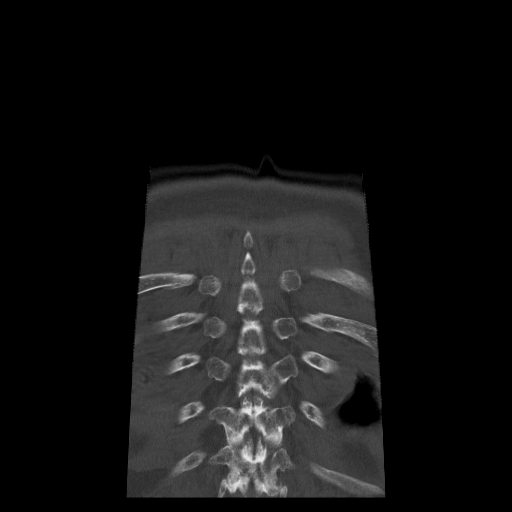
[im 30/74  bone]
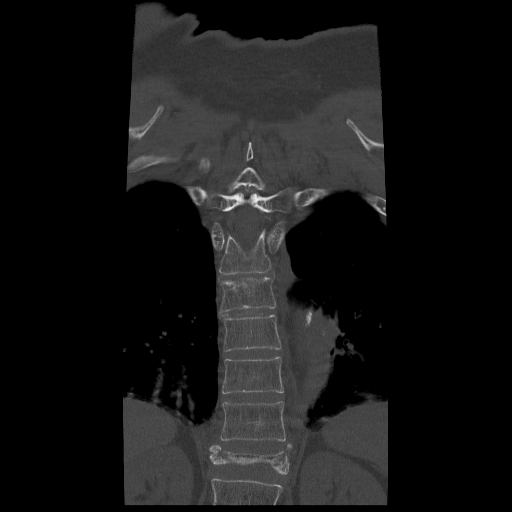
[im 44/74  bone]
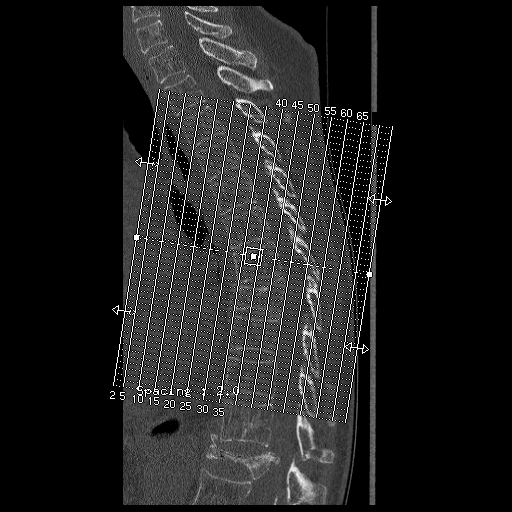

[Series 201: cor 2 · coronal · 0.61mm/px · 6 of 60 slices shown]
[im 9/60  bone]
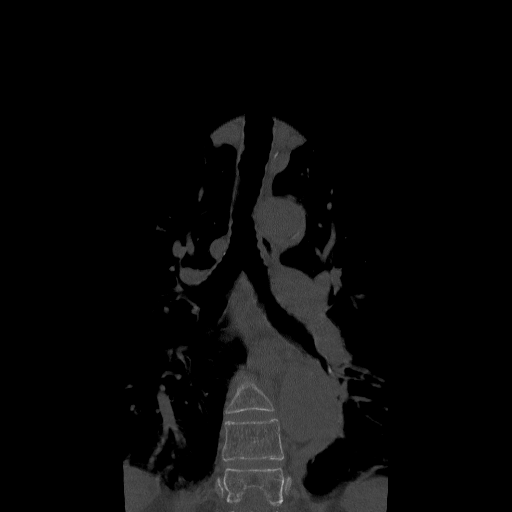
[im 17/60  bone]
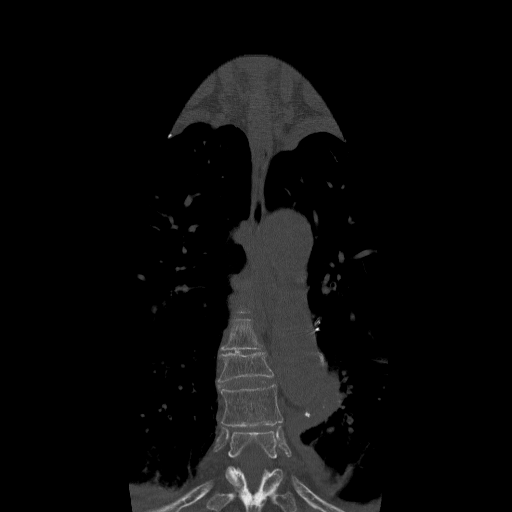
[im 18/60  soft-tissue]
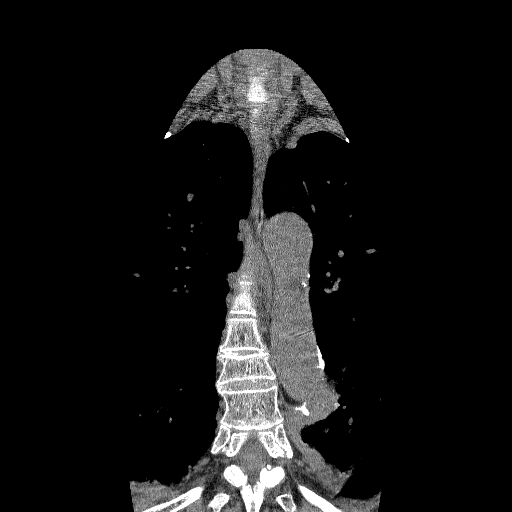
[im 26/60  bone]
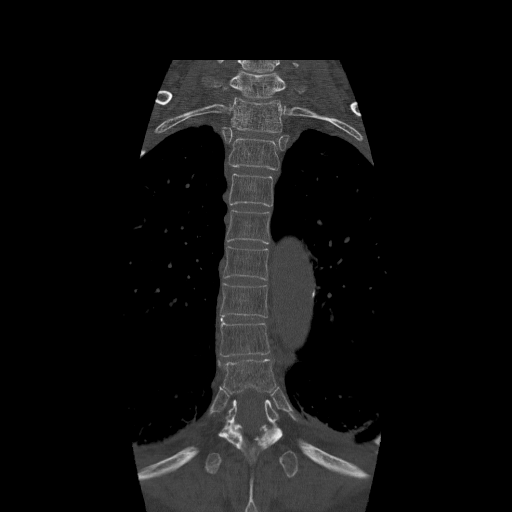
[im 34/60  bone]
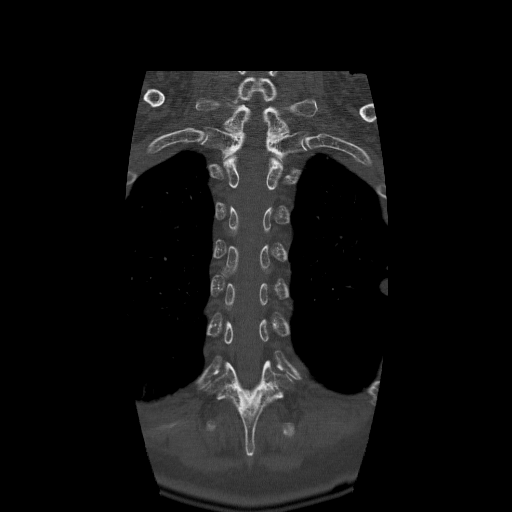
[im 43/60  bone]
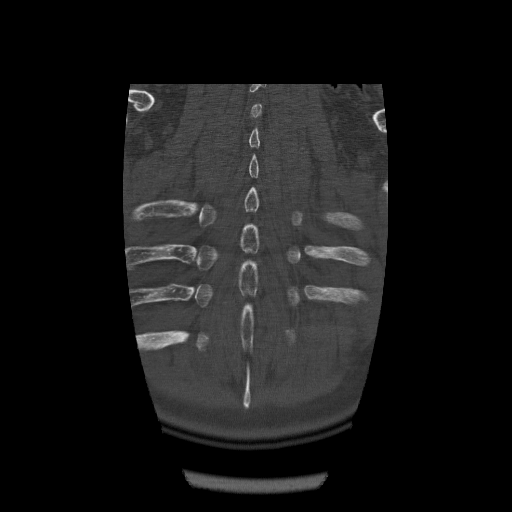

[17 of 34 positions shown; findings below may reference images not displayed]

FINDINGS: Advanced changes of emphysema. Mild bibasilar atelectasis or
scarring.

Mild to moderate compression fracture T8 is unchanged from the CT of
10/26/2013. No retropulsion into the spinal canal. This appears
benign.

Severe burst fracture T12, not present on the prior CT. There is
retropulsion of bone into the canal causing marked spinal stenosis.
There is some sclerotic change in the vertebral body indicated
healing. No definite tumor mass identified. Bone is retropulsed into
the canal by 12 mm. There is approximately 65% stenosis of the
spinal canal due to bony retropulsion.

No other fracture or metastatic disease identified. No other levels
of spinal stenosis are identified.
IMPRESSION: Severe burst fracture T12. Bony retropulsion into the canal narrows
the canal by 65% diameter stenosis.

Benign chronic fracture of T8.

Severe emphysema.

## 2015-11-17 ENCOUNTER — Encounter: Payer: Self-pay | Admitting: Family Medicine

## 2015-11-18 ENCOUNTER — Encounter: Payer: Self-pay | Admitting: Family Medicine

## 2015-11-18 DIAGNOSIS — J449 Chronic obstructive pulmonary disease, unspecified: Secondary | ICD-10-CM

## 2015-12-06 DIAGNOSIS — C3490 Malignant neoplasm of unspecified part of unspecified bronchus or lung: Secondary | ICD-10-CM | POA: Diagnosis not present

## 2015-12-06 DIAGNOSIS — R0902 Hypoxemia: Secondary | ICD-10-CM | POA: Diagnosis not present

## 2015-12-06 DIAGNOSIS — R0602 Shortness of breath: Secondary | ICD-10-CM | POA: Diagnosis not present

## 2015-12-06 DIAGNOSIS — J9621 Acute and chronic respiratory failure with hypoxia: Secondary | ICD-10-CM | POA: Diagnosis not present

## 2015-12-20 ENCOUNTER — Other Ambulatory Visit: Payer: PPO

## 2015-12-20 ENCOUNTER — Encounter: Payer: Self-pay | Admitting: Pulmonary Disease

## 2015-12-20 ENCOUNTER — Ambulatory Visit (INDEPENDENT_AMBULATORY_CARE_PROVIDER_SITE_OTHER): Payer: PPO | Admitting: Pulmonary Disease

## 2015-12-20 VITALS — BP 108/78 | HR 89 | Ht 66.0 in | Wt 134.8 lb

## 2015-12-20 DIAGNOSIS — J449 Chronic obstructive pulmonary disease, unspecified: Secondary | ICD-10-CM | POA: Diagnosis not present

## 2015-12-20 DIAGNOSIS — R0609 Other forms of dyspnea: Secondary | ICD-10-CM

## 2015-12-20 DIAGNOSIS — C3492 Malignant neoplasm of unspecified part of left bronchus or lung: Secondary | ICD-10-CM | POA: Diagnosis not present

## 2015-12-20 DIAGNOSIS — R0600 Dyspnea, unspecified: Secondary | ICD-10-CM | POA: Insufficient documentation

## 2015-12-20 DIAGNOSIS — R0902 Hypoxemia: Secondary | ICD-10-CM

## 2015-12-20 MED ORDER — BUDESONIDE-FORMOTEROL FUMARATE 160-4.5 MCG/ACT IN AERO: 2.0000 | INHALATION_SPRAY | Freq: Two times a day (BID) | RESPIRATORY_TRACT | Status: DC

## 2015-12-20 MED ORDER — ALBUTEROL SULFATE HFA 108 (90 BASE) MCG/ACT IN AERS: 2.0000 | INHALATION_SPRAY | Freq: Four times a day (QID) | RESPIRATORY_TRACT | Status: DC | PRN

## 2015-12-20 NOTE — Assessment & Plan Note (Addendum)
Patient with severe COPD. Spirometry (January 2016) FEV1 0.85 or 40%. Chest x-ray (October 2016) severe COPD changes. Slowly getting worse the last year. On oxygen 2 L 24/7 since Christmas 2015. Only  uses albuterol when necessary. Plan : 1. Start Symbicort, 160/4.5, 2 puffs twice a day. Rinse mouth with each use. 2. Continue albuterol only as when necessary. She was using albuterol as her maintenance medicine. 3. Needs PFT. We'll need ABG later on. 4. We'll need chest x-ray on follow-up. 5. Check alpha-1 level. 6. Mentioned about pulmonary rehabilitation. She tells me she tries to walk everyday at least a mile. Something to consider on follow-up. She lives in [place]. 7. Up-to-date with pneumonia vaccine. Got the flu shot 2016. 8. She was asking about stem cell transplant with copd. told her we need to maximize her medications. Mentioned about lung reduction surgery with copd.

## 2015-12-20 NOTE — Patient Instructions (Signed)
1. We will start you on Symbicort, 160/4.5,2 puffs twice a day. Rinse mouth after each use. 2. Continue with albuterol,  2 puffs,every4 hours as needed for dyspna. 3.  We will do blood work today to check if you have an inherited form of copd. 4. We will schedule you for a breathing test. 5. Continue using oxygen  to keep your oxygen level more than 88%.  Return to clinic in 2 mos.

## 2015-12-20 NOTE — Assessment & Plan Note (Signed)
Likely from severe COPD. Will consider cardiac w/u on f/u. May need echo.

## 2015-12-20 NOTE — Assessment & Plan Note (Signed)
Stage IB bronchoalveolar carcinoma,. Well-differentiated the left lower lobe status post resection on 12/21/2010. She had 14 lymph nodes negative and no perineural space invasion with clear margins and no LV I.  Thus far she has no evidence for recurrent disease or new disease.   Chest x-ray October 2016-no obvious mass.  We'll need a repeat x-ray middle of 2017. Might need a chest CT scan.

## 2015-12-20 NOTE — Progress Notes (Signed)
Subjective:    Patient ID: [name], female    DOB: 23-Jan-1941, 75 y.o.   MRN: [id number]  HPI  This is the case of [name], 75 y.o. Female, who was referred by Dr. Jenna Luo  in consultation regarding her COPD.   As you very well know, patient has a 60-pack-year smoking history, quit  in 2012  when she was diagnosd wih lung cancer.  She had left lower lobe lobectomy in 2012.Cancer free.  Patient was diagnosed with COPD in 20s. She was started on oxygen in Christmas of 2015. Her dyspnea has slowly gotten worse the last year. Has not been admitted nor has had a flare of COPD the last year.  She only uses albuterol daily for symptoms. Tried nebulizer before. She prefers MDIs.  Uses oxygen, 2 L, 24/7, since Christmas 2015. She has a pulse oximeter.   Patient had spirometry done in  January 2016 with an FEV1  of 0.85 or 40%. Chest x-ray in October/2016  Showing severe COPD changes.   Review of Systems  Constitutional: Negative.  Negative for fever and unexpected weight change.  HENT: Positive for congestion, postnasal drip and rhinorrhea. Negative for dental problem, ear pain, nosebleeds, sinus pressure, sneezing, sore throat and trouble swallowing.   Eyes: Negative.  Negative for redness and itching.  Respiratory: Positive for cough, shortness of breath and wheezing.   Cardiovascular: Positive for palpitations and leg swelling.  Gastrointestinal: Negative.  Negative for nausea and vomiting.  Endocrine: Negative.   Genitourinary: Negative.  Negative for dysuria.  Musculoskeletal: Positive for joint swelling and arthralgias.  Skin: Negative.  Negative for rash.  Allergic/Immunologic: Negative.   Neurological: Negative.  Negative for headaches.  Hematological: Negative.  Does not bruise/bleed easily.  Psychiatric/Behavioral: Negative.  Negative for dysphoric mood. The patient is not nervous/anxious.   All other systems reviewed and are negative.  Past Medical  History  Diagnosis Date  . COPD (chronic obstructive pulmonary disease) ([place])   . Anxiety disorder   . Hypothyroidism   . PUD (peptic ulcer disease)     51yr  . Dysphagia   . IBS (irritable bowel syndrome)   . Diverticulosis   . Trigeminal neuralgia   . Diastolic dysfunction   . Allergic rhinitis   . Adenocarcinoma of lung ([facility] 12[place]2010    left lung/surg only  . Hiatal hernia   . [place]ortic aneurysm ([facility] [place]   . Complication of anesthesia 2011    bloodpressure dropped during colonoscopy, not problems since  . Shortness of breath     with exertion  . 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[facility]     corrected by surgery 08/21/13  . Compression fracture   . On home O2 12/15/2013    chronic hypoxia  . Depression   . Emphysema lung ([facility] 11/08/2014     Family History  Problem Relation Age of Onset  . Liver cancer Sister   . Cancer Sister   . Pulmonary embolism Mother   . Heart attack Father   . Hypertension Father   . Cancer Brother   . Cancer Daughter   . Colon cancer Neg Hx      Past Surgical History  Procedure Laterality Date  . 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 Esophagogastroduodenoscopy  11/13/2009    w/dilation to 128m gastric ulceration (H.Pylori) s/p treatment  . Esophagogastroduodenoscopy  11/21/2009    distal esophageal web, gastritis  . Lung cancer surgery  12/2010    Left VATS, minithoracotomy, LLL superior segmentectomy  . Vocal cord surgery  02/06/2011    laryngoscopy with bilateral vocal cord Radiesse injection for vocal cord paralysis  . Abdominal hysterectomy    . Cholecystectomy    . Tubal ligation    . Cataract extraction    . Fatty tumor removal from lt groin    . Cataract extraction w/phaco  09/02/2012    Procedure: CATARACT EXTRACTION PHACO AND INTRAOCULAR LENS PLACEMENT (IOC);  Surgeon: Elta Guadeloupe T. Gershon Crane, MD;  Location: [facility];  Service: Ophthalmology;  Laterality: Left;  CDE:10.35  . Endarterectomy Right 08/21/2013     Procedure: RIGHT CAROTID ANEURYSM RESECTION;  Surgeon: Rosetta Posner, MD;  Location[facility] OR;  Service: Vascular;  Laterality: Right;  . Fracture surgery Left     hand and left ring finger  . Orif wrist fracture Left 04/09/2014    Procedure: OPEN REDUCTION INTERNAL FIXATION (ORIF) WRIST FRACTURE;  Surgeon: Renette Butters, MD;  Location: [place];  Service: Orthopedics;  Laterality: Left;  . Percutaneous pinning Left 04/09/2014    Procedure: PERCUTANEOUS PINNING EXTREMITY;  Surgeon: Renette Butters, MD;  Location: [place];  Service: Orthopedics;  Laterality: Left;  . Esophagogastroduodenoscopy  03/14/12    OZD:[id number] in the distal esophagus/Mild gastritis/small HH. + H.pylori, prescribed Pylera. Finished treatment.   . Back surgery  January 13, 2014  . Colonoscopy N/A 11/23/2014    Procedure: COLONOSCOPY;  Surgeon: Danie Binder, MD;  [facility];  Service: Endoscopy;  Laterality: N/A;  930   . Yag laser application Left 25/95/6387    Procedure: YAG LASER APPLICATION;  Surgeon: Rutherford Guys, MD;  Location: [facility];  Service: Ophthalmology;  Laterality: Left;    Social History   Social History  . Marital Status: Divorced    Spouse Name: N/A  . Number of Children: N/A  . Years of Education: N/A   Occupational History  . Not on file.   Social History Main Topics  . Smoking status: Former Smoker -- 0.50 packs/day for 20 years    Types: Cigarettes    Quit date: 12/21/2010  . Smokeless tobacco: Never Used     Comment: smoking cessation info given and reviewed   . Alcohol Use: 0.0 oz/week    0 Standard drinks or equivalent per week     Comment: seldom  . Drug Use: No  . Sexual Activity: Yes    Birth Control/ Protection: Surgical   Other Topics Concern  . 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 (-) ETOH.   Allergies  Allergen Reactions  . Ciprofloxacin Hcl Hives  . Iohexol      Desc: Per [facility], pt is allergic to IV contrast, no  type of reaction was available. Pt cannot remember but first reacted around 15 yrs ago from an IVP. Notes were date from 2009 at urology center., Onset Date: [id number]   . Prozac [Fluoxetine Hcl] Rash  . Sulfonamide Derivatives Hives and Rash     Outpatient Prescriptions Prior to Visit  Medication Sig Dispense Refill  . calcium citrate-vitamin D (CITRACAL+D) 315-200 MG-UNIT per tablet Take 1 tablet by mouth daily.     . citalopram (CELEXA) 40 MG tablet Take 1 tablet (40 mg total) by mouth at bedtime. 90 tablet 4  . Flaxseed, Linseed, (FLAXSEED OIL) OIL Take 1 capsule by mouth daily.    Marland Kitchen levothyroxine (SYNTHROID, LEVOTHROID) 25 MCG tablet Take  1 tablet (25 mcg total) by mouth daily before breakfast. 90 tablet 4  . omeprazole (PRILOSEC) 20 MG capsule Take 20 mg by mouth daily.    . sodium bicarbonate 650 MG tablet TAKE (1) TABLET BY MOUTH TWICE DAILY. (Patient not taking: Reported on 11/14/2015) 60 tablet 0   No facility-administered medications prior to visit.   Meds ordered this encounter  Medications  . AMITIZA 24 MCG capsule    Sig: Take 1 tablet by mouth 2 (two) times daily.  Marland Kitchen omeprazole (PRILOSEC) 40 MG capsule    Sig: Take 1 capsule by mouth daily.  . budesonide-formoterol (SYMBICORT) 160-4.5 MCG/ACT inhaler    Sig: Inhale 2 puffs into the lungs 2 (two) times daily.    Dispense:  1 Inhaler    Refill:  6  . albuterol (PROVENTIL HFA;VENTOLIN HFA) 108 (90 Base) MCG/ACT inhaler    Sig: Inhale 2 puffs into the lungs every 6 (six) hours as needed for wheezing or shortness of breath.    Dispense:  1 Inhaler    Refill:  6          Objective:   Physical Exam   Vitals:  Filed Vitals:   12/20/15 1117  BP: 108/78  Pulse: 89  Height: '5\' 6"'$  (1.676 m)  Weight: 134 lb 12.8 oz (61.145 kg)  SpO2: 91%    Constitutional/General:  Pleasant, well-nourished, well-developed, not in any distress,  Comfortably seating.  Well kempt. Chronically ill appearing.   Body mass index is 21.77  kg/(m^2). Wt Readings from Last 3 Encounters:  12/20/15 134 lb 12.8 oz (61.145 kg)  11/14/15 135 lb 9.6 oz (61.508 kg)  08/01/15 134 lb (60.782 kg)     HEENT: Pupils equal and reactive to light and accommodation. Anicteric sclerae. Normal nasal mucosa.   No oral  lesions,  mouth clear,  oropharynx clear, no postnasal drip. (-) Oral thrush. No dental caries.  Airway - Mallampati class III  Neck: No masses. Midline trachea. No JVD, (-) LAD. (-) bruits appreciated.  Respiratory/Chest: Grossly normal chest. (-) deformity. (-) Accessory muscle use.  Symmetric expansion. (-) Tenderness on palpation.  Resonant on percussion.  Diminished BS on both lower lung zones. occl wheezing,some bibasilar  crackles, (-) rhonchi (-) egophony  Cardiovascular: Regular rate and  rhythm, heart sounds normal, no murmur or gallops, no peripheral edema  Gastrointestinal:  Normal bowel sounds. Soft, non-tender. No hepatosplenomegaly.  (-) masses.   Musculoskeletal:  Normal muscle tone. Normal gait.   Extremities: Grossly normal. (-) clubbing, cyanosis.  (-) edema  Skin: (-) rash,lesions seen.   Neurological/Psychiatric : alert, oriented to time, place, person. Normal mood and affect           Assessment & Plan:  COPD (chronic obstructive pulmonary disease) Patient with severe COPD. Spirometry (January 2016) FEV1 0.85 or 40%. Chest x-ray (October 2016) severe COPD changes. Slowly getting worse the last year. On oxygen 2 L 24/7 since Christmas 2015. Only  uses albuterol when necessary. Plan : 1. Start Symbicort, 160/4.5, 2 puffs twice a day. Rinse mouth with each use. 2. Continue albuterol only as when necessary. She was using albuterol as her maintenance medicine. 3. Needs PFT. We'll need ABG later on. 4. We'll need chest x-ray on follow-up. 5. Check alpha-1 level. 6. Mentioned about pulmonary rehabilitation. She tells me she tries to walk everyday at least a mile. Something to consider  on follow-up. She lives in [place]. 7. Up-to-date with pneumonia vaccine. Got the flu shot 2016. 8. She  was asking about stem cell transplant with copd. told her we need to maximize her medications. Mentioned about lung reduction surgery with copd.   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 Well-differentiated the left lower lobe status post resection on 12/21/2010. She had 14 lymph nodes negative and no perineural space invasion with clear margins and no LV I.  Thus far she has no evidence for recurrent disease or new disease.   Chest x-ray October 2016-no obvious mass.  We'll need a repeat x-ray middle of 2017. Might need a chest CT scan.  Hypoxia Patient uses 2 L oxygen, nasal cannula, 24/7. DME is [facility]. Patient has a pulse oximeter. Keep O2 sats ration more than 88%. I anticipate, she'll probably just need oxygen with exertion. Her O2 sats on room air sitting was 92%.  Exertional dyspnea Likely from severe COPD. Will consider cardiac w/u on f/u. May need echo.     Thank you very much for letting me participate in this patient's care. Please do not hesitate to give me a call if you have any questions or concerns regarding the treatment plan.   Patient will follow up with me in 2 mos.    Monica Becton, MD Pulmonary and [place] Pager: [phone] Office: [phone], Fax: [phone]

## 2015-12-20 NOTE — Assessment & Plan Note (Signed)
Patient uses 2 L oxygen, nasal cannula, 24/7. DME is [facility]. Patient has a pulse oximeter. Keep O2 sats ration more than 88%. I anticipate, she'll probably just need oxygen with exertion. Her O2 sats on room air sitting was 92%.

## 2015-12-23 ENCOUNTER — Encounter: Payer: Self-pay | Admitting: Family Medicine

## 2015-12-24 LAB — ALPHA-1 ANTITRYPSIN PHENOTYPE: A-1 Antitrypsin: 153 mg/dL (ref 83–199)

## 2016-01-03 DIAGNOSIS — R0602 Shortness of breath: Secondary | ICD-10-CM | POA: Diagnosis not present

## 2016-01-03 DIAGNOSIS — J9621 Acute and chronic respiratory failure with hypoxia: Secondary | ICD-10-CM | POA: Diagnosis not present

## 2016-01-03 DIAGNOSIS — C3490 Malignant neoplasm of unspecified part of unspecified bronchus or lung: Secondary | ICD-10-CM | POA: Diagnosis not present

## 2016-01-03 DIAGNOSIS — R0902 Hypoxemia: Secondary | ICD-10-CM | POA: Diagnosis not present

## 2016-01-10 ENCOUNTER — Ambulatory Visit (INDEPENDENT_AMBULATORY_CARE_PROVIDER_SITE_OTHER): Payer: PPO | Admitting: Pulmonary Disease

## 2016-01-10 DIAGNOSIS — J449 Chronic obstructive pulmonary disease, unspecified: Secondary | ICD-10-CM

## 2016-01-10 LAB — PULMONARY FUNCTION TEST
DL/VA % pred: 43 %
DL/VA: 2.07 ml/min/mmHg/L
DLCO cor % pred: 36 %
DLCO cor: 8.53 ml/min/mmHg
DLCO unc % pred: 38 %
DLCO unc: 9.01 ml/min/mmHg
FEF 25-75 Post: 0.49 L/sec
FEF 25-75 Pre: 0.42 L/sec
FEF2575-%Change-Post: 17 %
FEF2575-%Pred-Post: 29 %
FEF2575-%Pred-Pre: 25 %
FEV1-%Change-Post: 6 %
FEV1-%Pred-Post: 48 %
FEV1-%Pred-Pre: 45 %
FEV1-Post: 1.01 L
FEV1-Pre: 0.95 L
FEV1FVC-%Change-Post: 0 %
FEV1FVC-%Pred-Pre: 54 %
FEV6-%Change-Post: 8 %
FEV6-%Pred-Post: 90 %
FEV6-%Pred-Pre: 83 %
FEV6-Post: 2.39 L
FEV6-Pre: 2.21 L
FEV6FVC-%Change-Post: 1 %
FEV6FVC-%Pred-Post: 100 %
FEV6FVC-%Pred-Pre: 99 %
FVC-%Change-Post: 6 %
FVC-%Pred-Post: 90 %
FVC-%Pred-Pre: 84 %
FVC-Post: 2.49 L
FVC-Pre: 2.34 L
Post FEV1/FVC ratio: 40 %
Post FEV6/FVC ratio: 96 %
Pre FEV1/FVC ratio: 41 %
Pre FEV6/FVC Ratio: 95 %
RV % pred: 140 %
RV: 3.17 L
TLC % pred: 118 %
TLC: 5.89 L

## 2016-01-10 NOTE — Progress Notes (Signed)
PFT done today. 

## 2016-01-18 ENCOUNTER — Other Ambulatory Visit: Payer: Self-pay | Admitting: Family Medicine

## 2016-01-18 MED ORDER — CITALOPRAM HYDROBROMIDE 40 MG PO TABS: 40.0000 mg | ORAL_TABLET | Freq: Every day | ORAL | Status: DC

## 2016-01-18 NOTE — Telephone Encounter (Signed)
Medication called/sent to requested pharmacy  

## 2016-01-19 ENCOUNTER — Other Ambulatory Visit: Payer: Self-pay | Admitting: *Deleted

## 2016-01-19 MED ORDER — CITALOPRAM HYDROBROMIDE 40 MG PO TABS: 40.0000 mg | ORAL_TABLET | Freq: Every day | ORAL | Status: DC

## 2016-01-19 NOTE — Telephone Encounter (Signed)
Received fax requesting refill on celexa.   Refill appropriate and filled per protocol.

## 2016-02-03 DIAGNOSIS — J9621 Acute and chronic respiratory failure with hypoxia: Secondary | ICD-10-CM | POA: Diagnosis not present

## 2016-02-03 DIAGNOSIS — C3490 Malignant neoplasm of unspecified part of unspecified bronchus or lung: Secondary | ICD-10-CM | POA: Diagnosis not present

## 2016-02-03 DIAGNOSIS — R0902 Hypoxemia: Secondary | ICD-10-CM | POA: Diagnosis not present

## 2016-02-03 DIAGNOSIS — R0602 Shortness of breath: Secondary | ICD-10-CM | POA: Diagnosis not present

## 2016-02-06 DIAGNOSIS — M545 Low back pain: Secondary | ICD-10-CM | POA: Diagnosis not present

## 2016-02-09 IMAGING — CR DG WRIST COMPLETE 3+V*L*
4 series · 4 of 4 positions shown · non-contrast
Comparison: 08/16/2007

CLINICAL DATA: Distal radius pain after fall

EXAM:
LEFT WRIST - COMPLETE 3+ VIEW

[x wrist pa left]
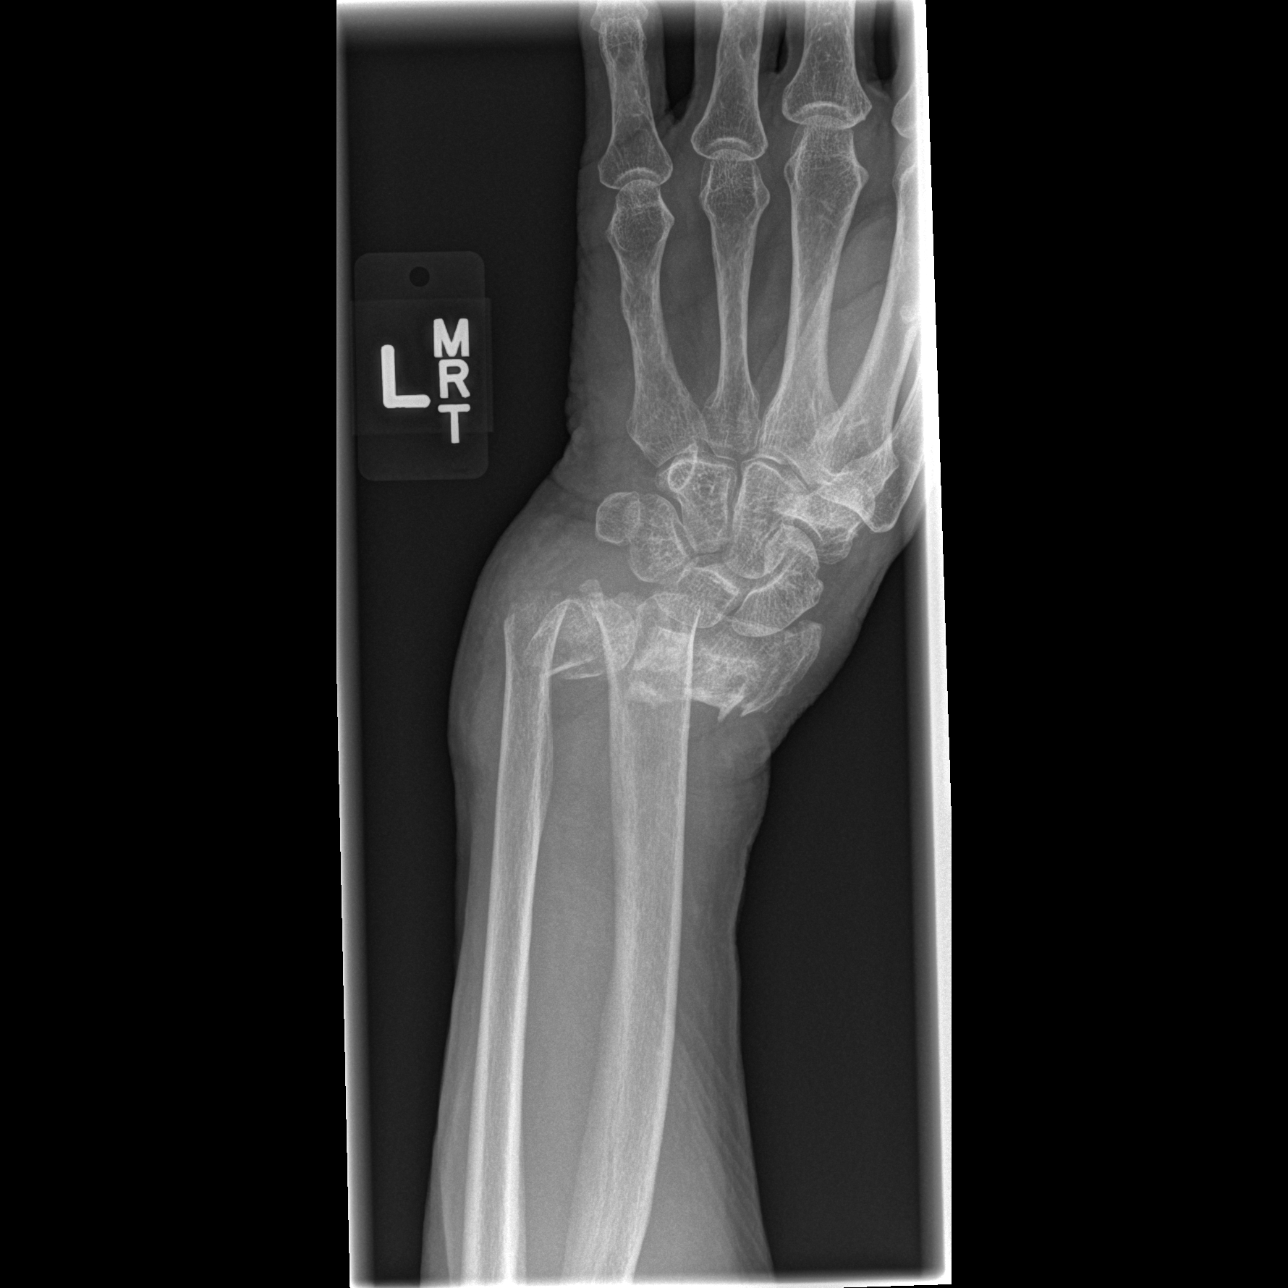

[x wrist obl left]
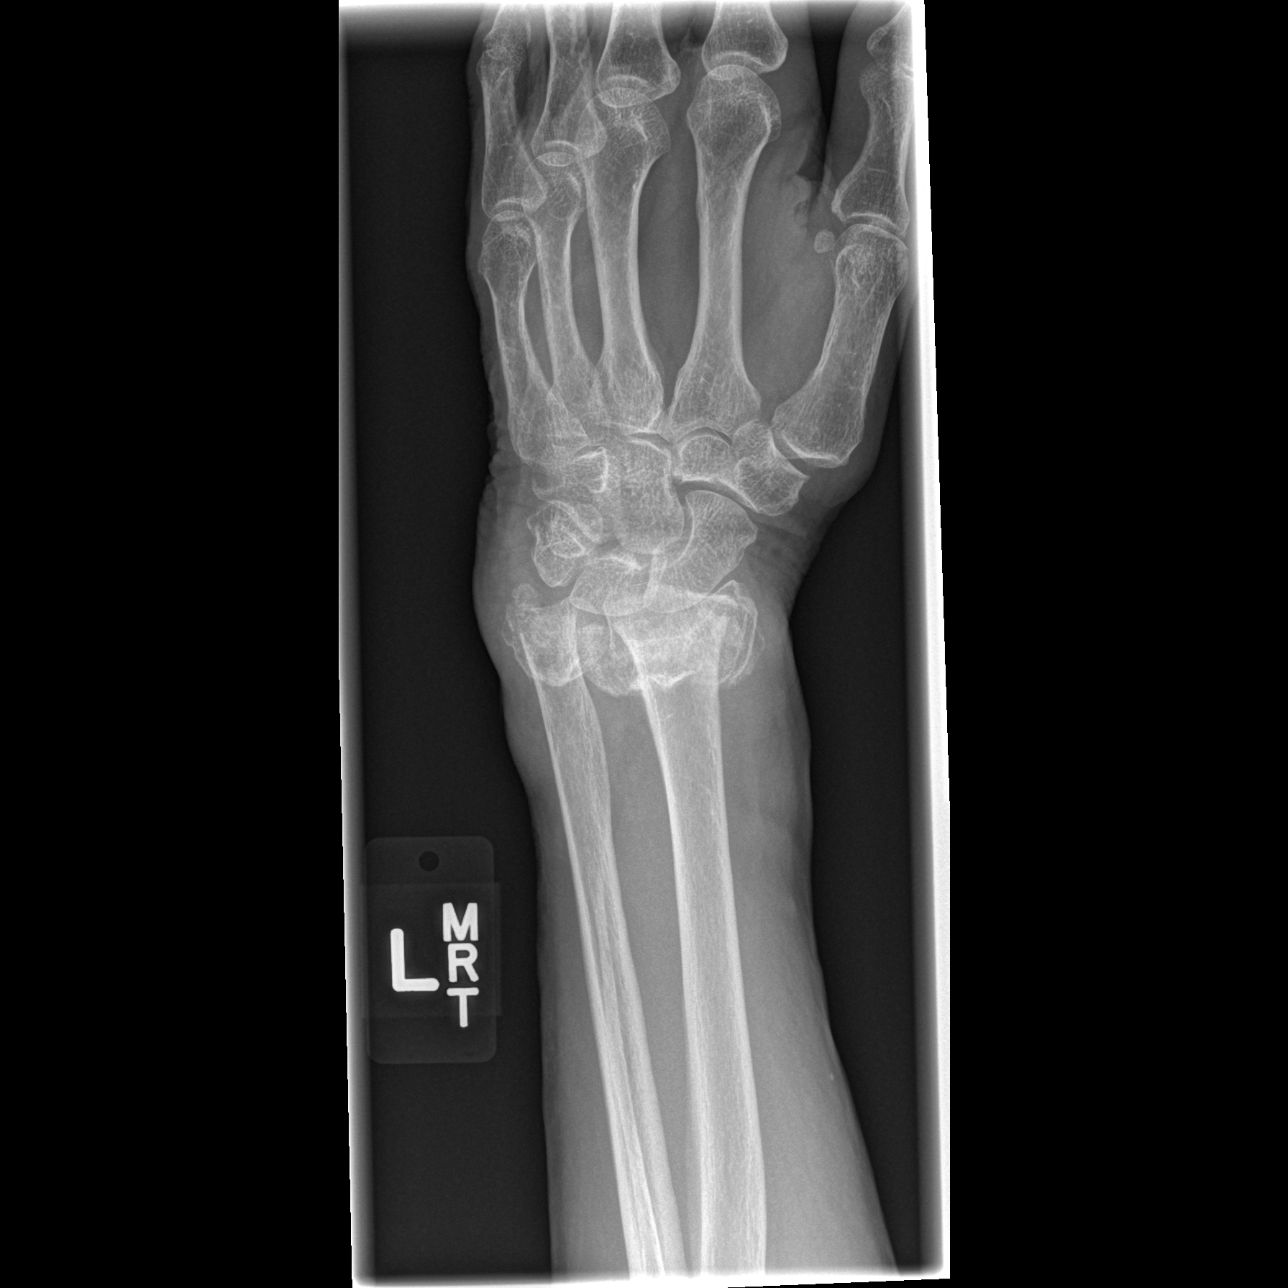

[x wrist lat left]
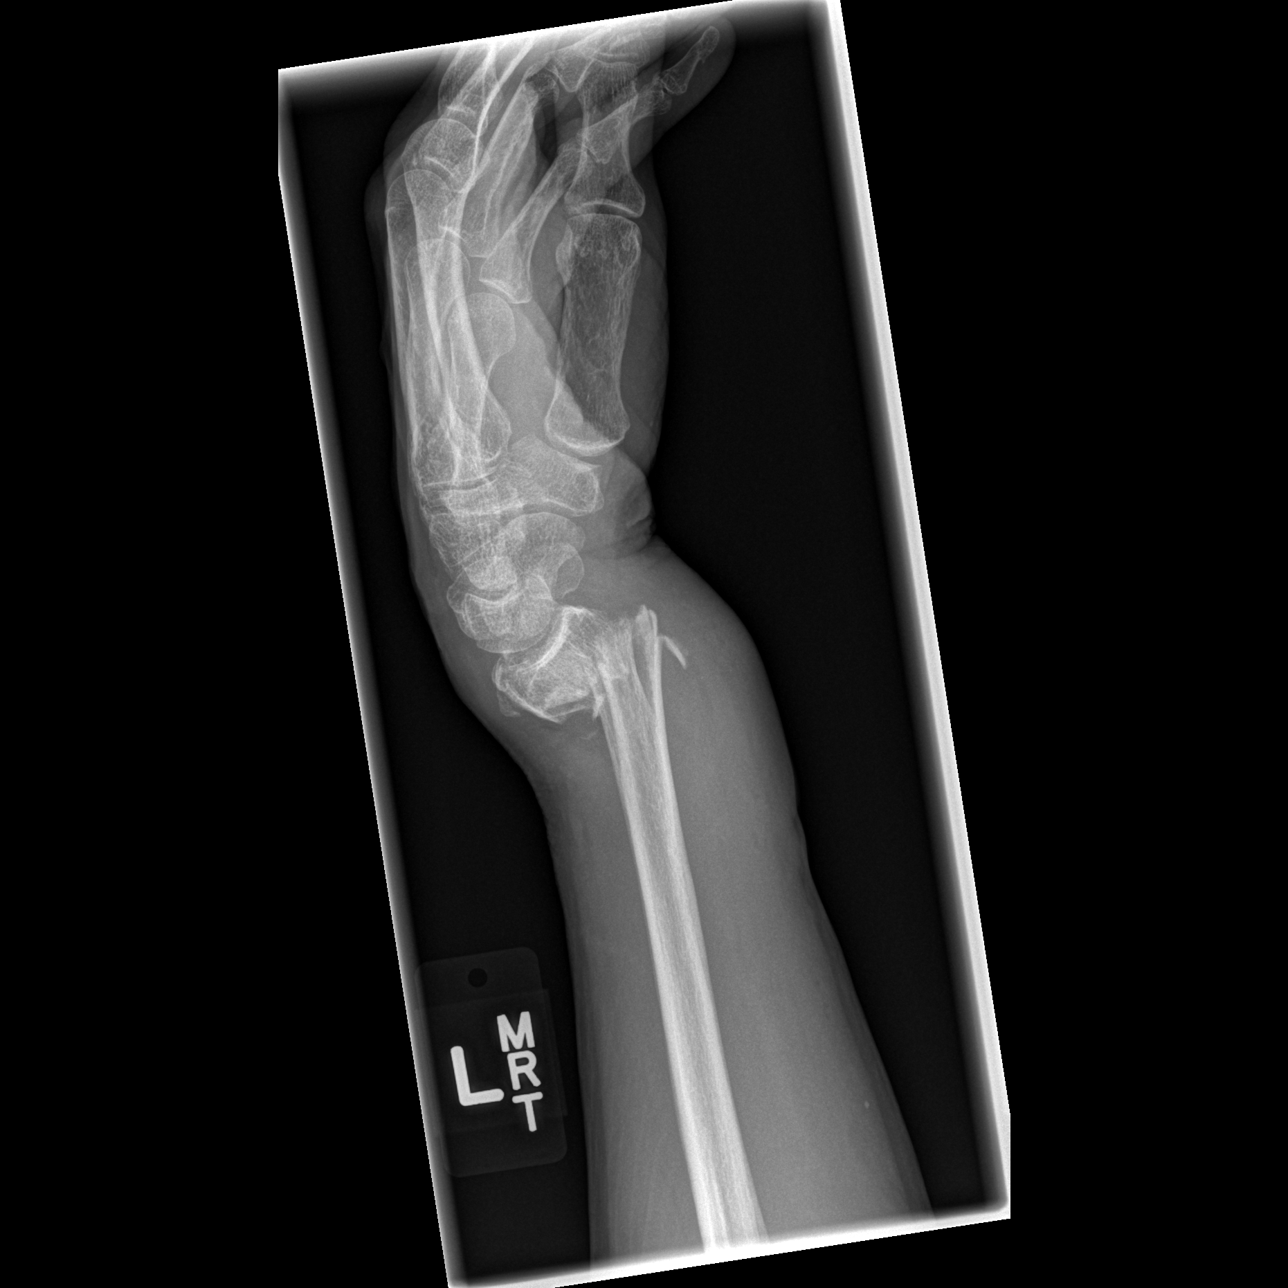

[x navicular]
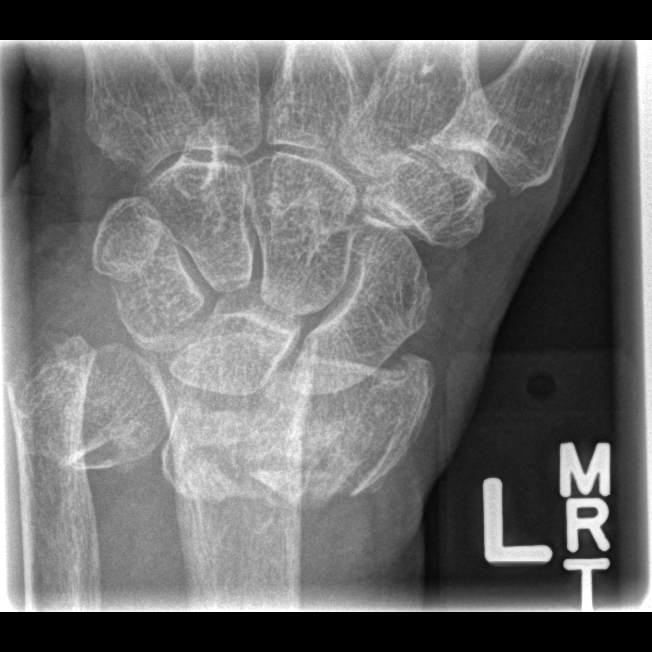

[4 of 4 positions shown; findings below may reference images not displayed]

FINDINGS: There are acute fractures to the distal radius and ulna with
displacement at the level of the metaphysis by 100%. The distal
fragments are displaced posteriorly and radially. Radiocarpal
alignment is maintained. Osteopenia.
IMPRESSION: Markedly displaced distal radius and ulna fractures.

## 2016-02-10 IMAGING — CT CT HEAD W/O CM
1 of 2 series · 15 of 30 positions shown, 19 images · non-contrast
Comparison: Prior CT from 07/08/2013

CLINICAL DATA: Fall

EXAM:
CT HEAD WITHOUT CONTRAST
TECHNIQUE: Contiguous axial images were obtained from the base of the skull
through the vertex without intravenous contrast.

[Series 3: head 2.0 h70h · axial · 0.44mm/px · z∈[-97,+37]mm · 15 of 75 slices shown, 19 images]
[im 4/75  brain]
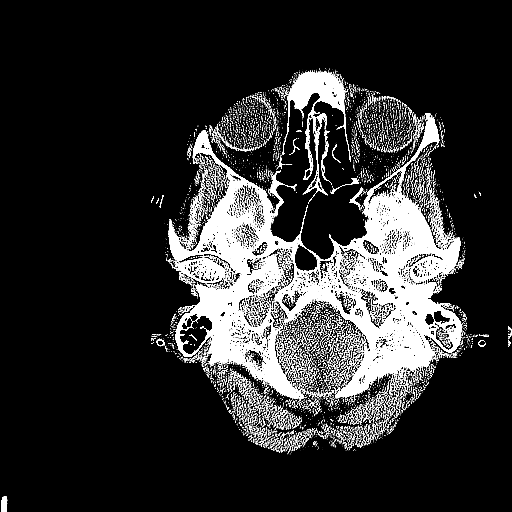
[im 4/75  bone]
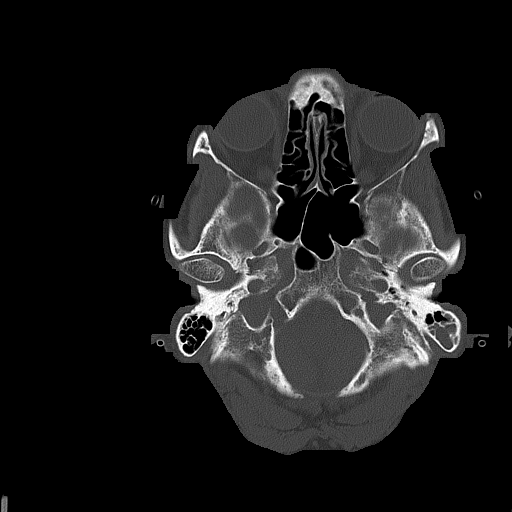
[im 8/75  brain]
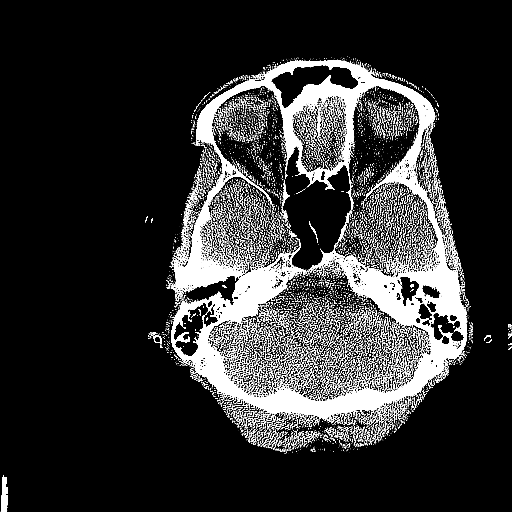
[im 15/75  brain]
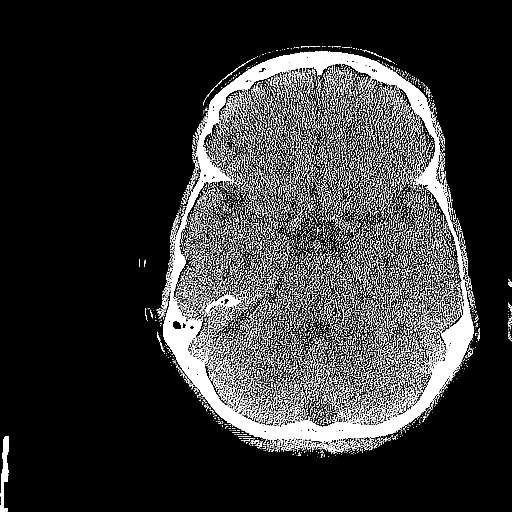
[im 19/75  brain]
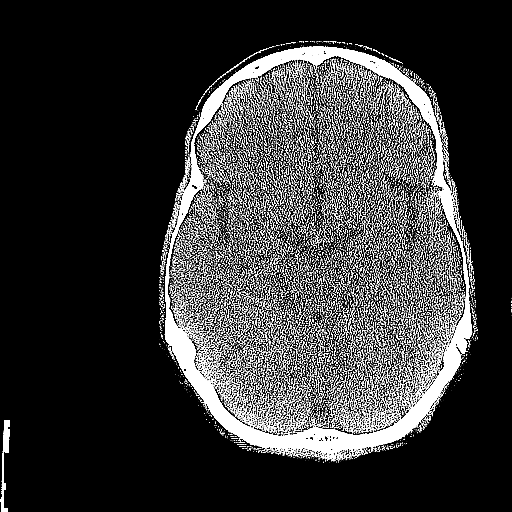
[im 23/75  brain]
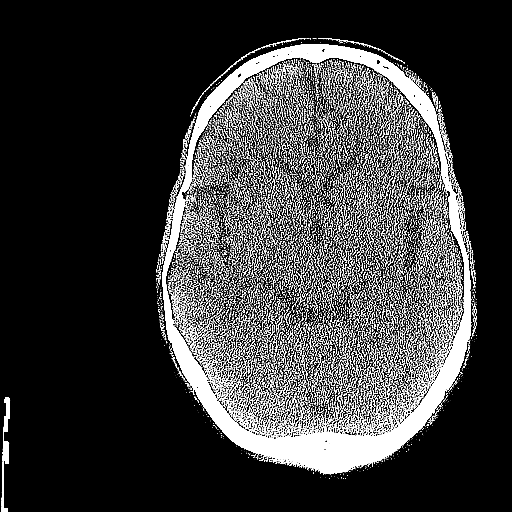
[im 23/75  bone]
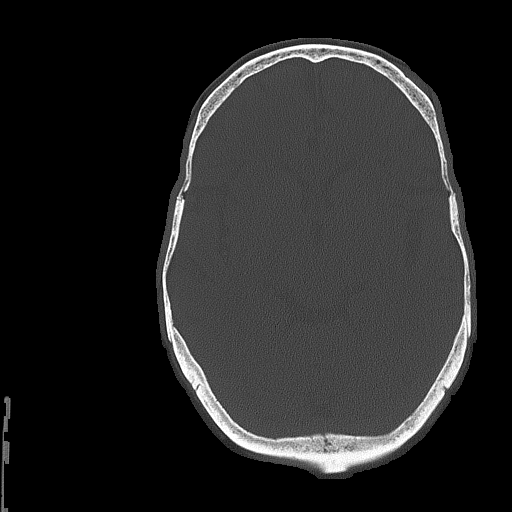
[im 26/75  brain]
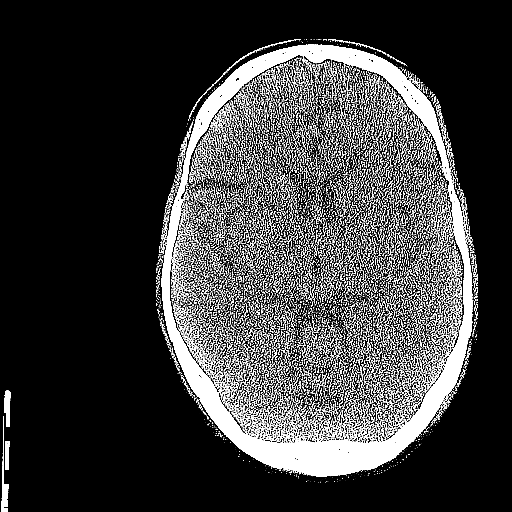
[im 34/75  brain]
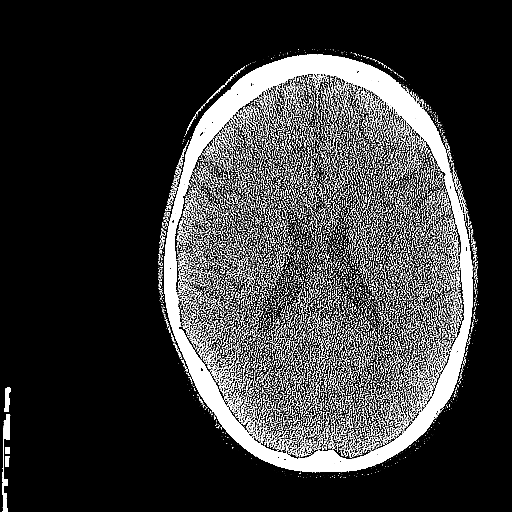
[im 38/75  brain]
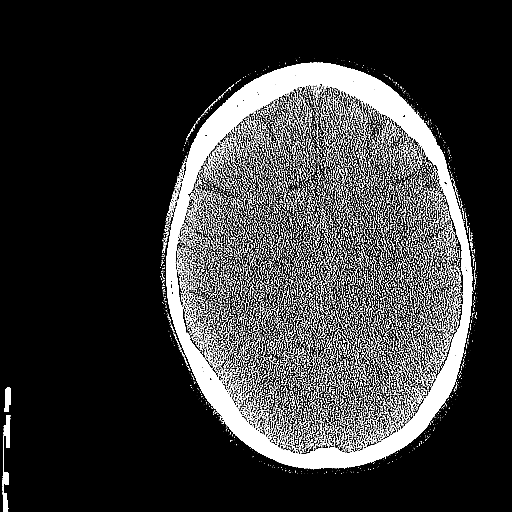
[im 41/75  brain]
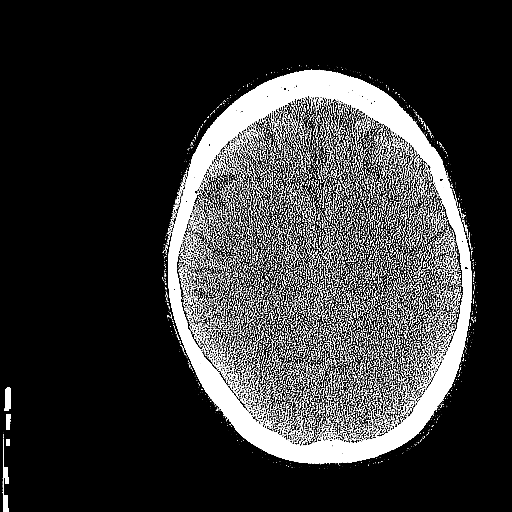
[im 41/75  bone]
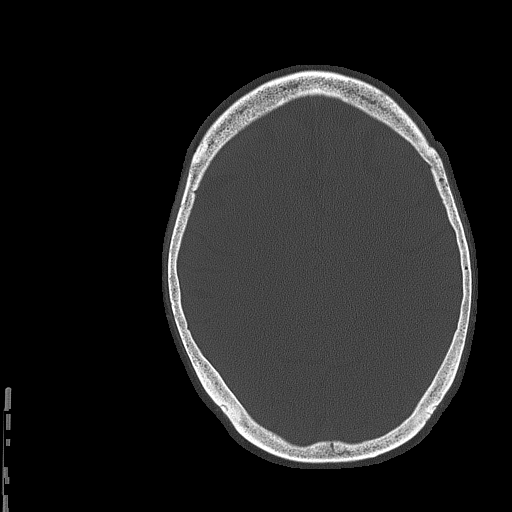
[im 49/75  brain]
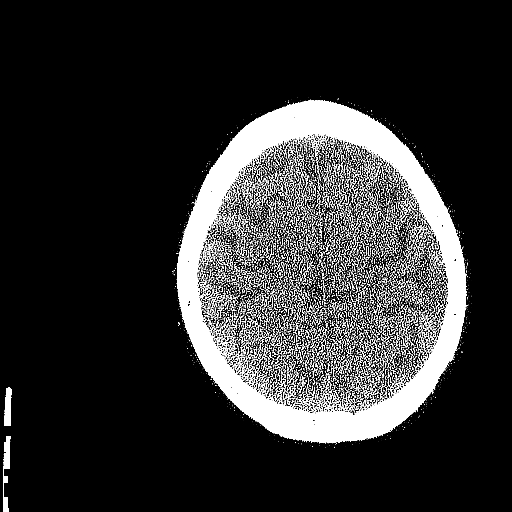
[im 52/75  brain]
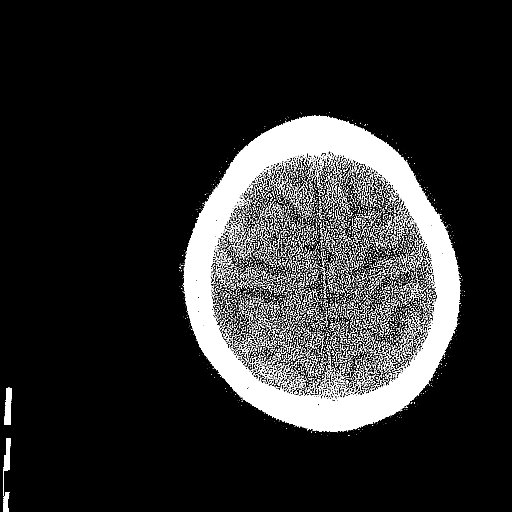
[im 56/75  brain]
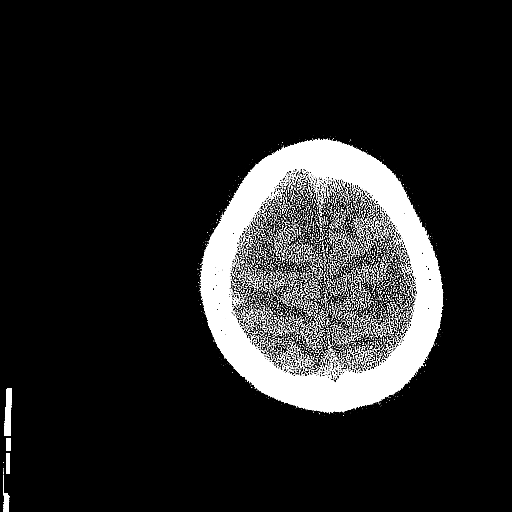
[im 60/75  brain]
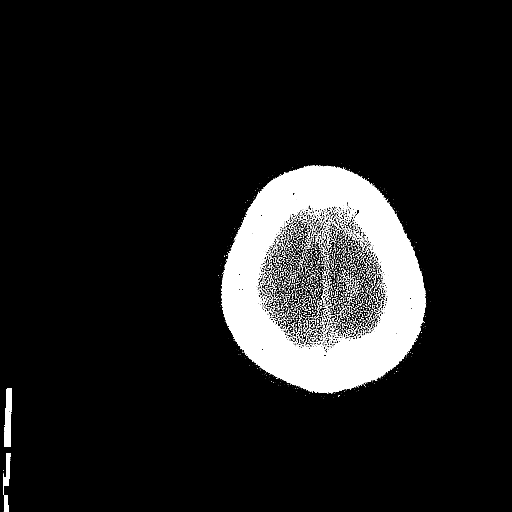
[im 60/75  bone]
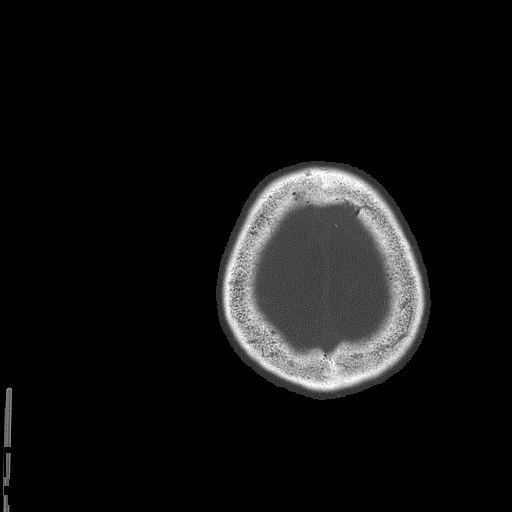
[im 67/75  brain]
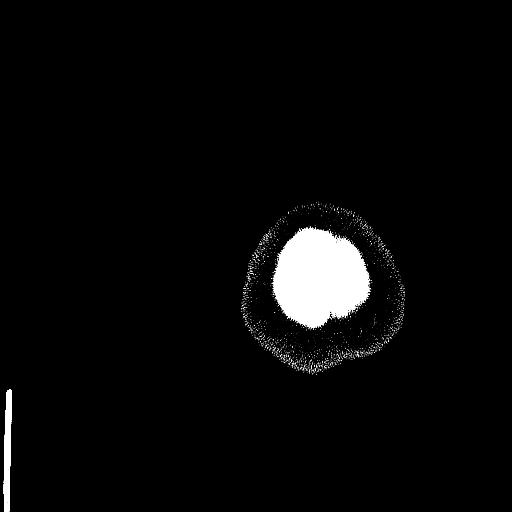
[im 71/75  brain]
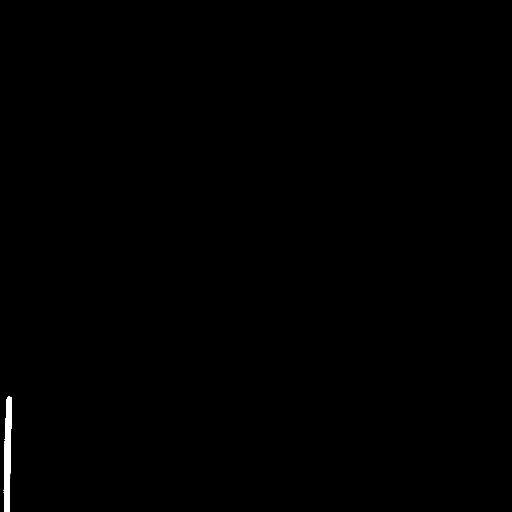

[15 of 30 positions shown; findings below may reference images not displayed]

FINDINGS: Diffuse prominence of the CSF containing spaces is compatible with
generalized cerebral atrophy. Mild chronic microvascular ischemic
changes present.

There is no acute intracranial hemorrhage or infarct. No mass lesion
or midline shift. Gray-white matter differentiation is well
maintained. Ventricles are normal in size without evidence of
hydrocephalus. CSF containing spaces are within normal limits. No
extra-axial fluid collection.

The calvarium is intact.

Orbital soft tissues are within normal limits.

The paranasal sinuses are clear.  Small left mastoid effusion noted.

Small left forehead contusion is present.
IMPRESSION: 1. No acute intracranial process.
2. Small left forehead contusion.
3. Generalized cerebral atrophy with mild chronic microvascular
ischemic disease.
4. Small left mastoid effusion.

## 2016-02-10 IMAGING — CR DG WRIST 2V*L*
2 series · 2 of 2 positions shown · non-contrast
Comparison: None.

CLINICAL DATA: Postop radial fracture fixation

EXAM:
LEFT WRIST - 2 VIEW

[no exam (1 of 2)]
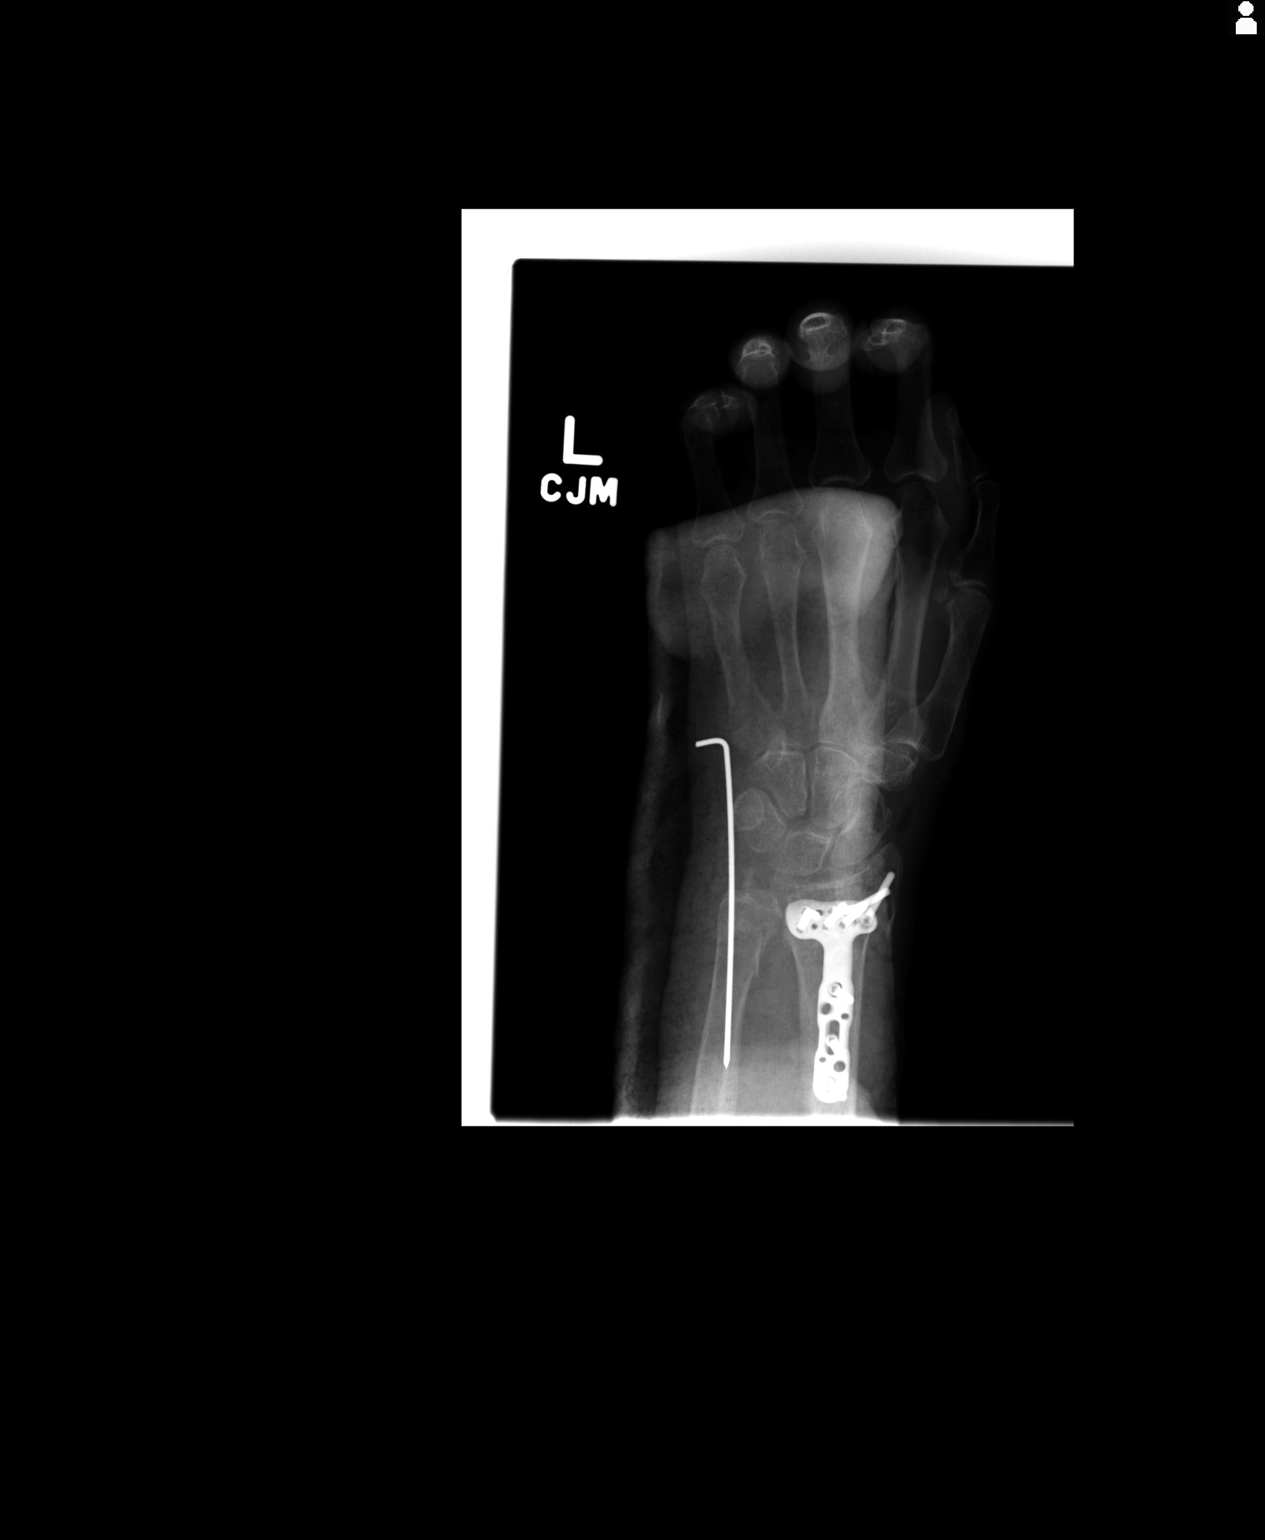

[no exam (2 of 2)]
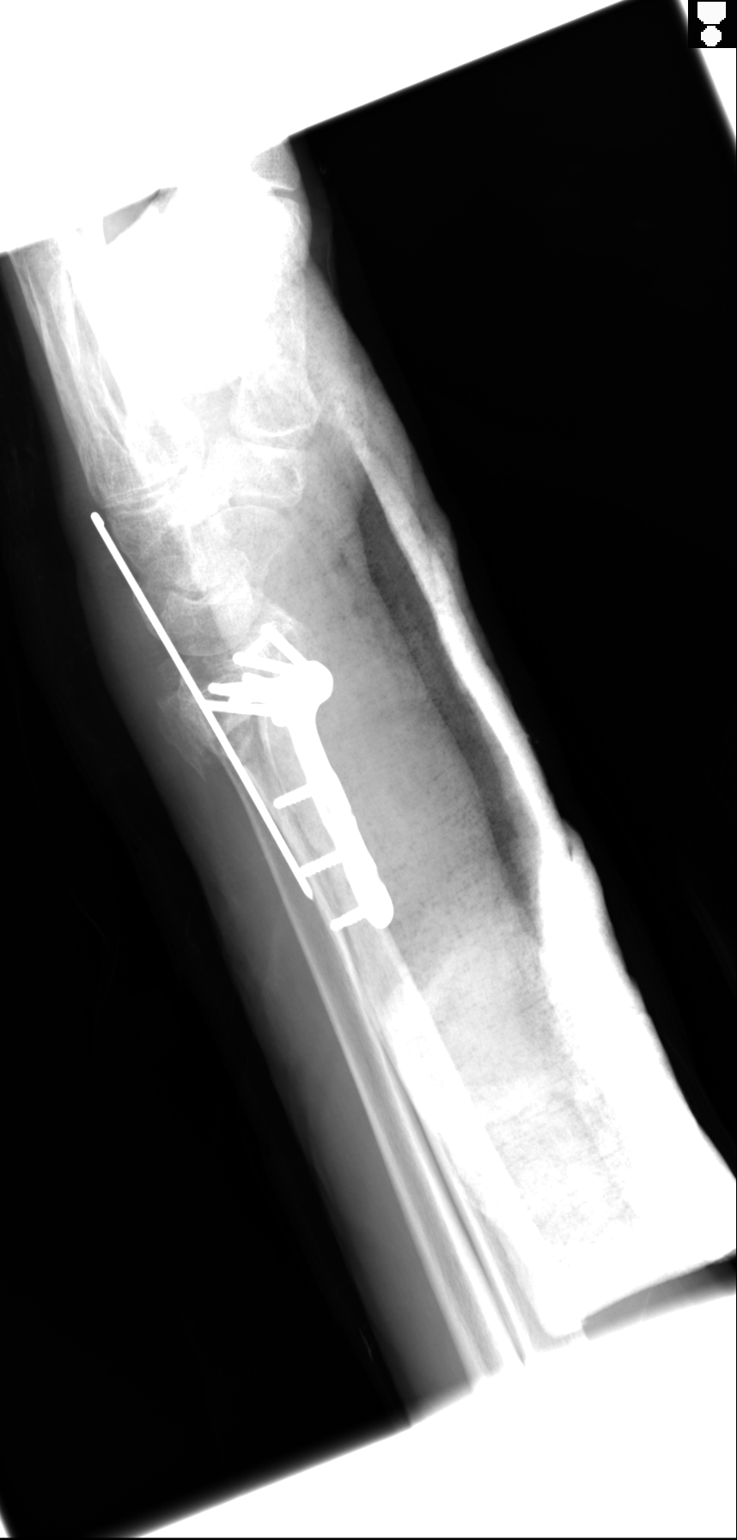

[2 of 2 positions shown; findings below may reference images not displayed]

FINDINGS: Interval ORIF of a comminuted and displaced distal radial metaphysis
fracture transfixed by a malleable side plate and multiple
interlocking screws. The alignment is near anatomic.

There is a K-wire transfixing a distal ulnar arm metaphysis
fracture.

There is no other fracture or dislocation.
IMPRESSION: 1. Interval ORIF of a distal radial metaphysis fracture.
2. Interval K-wire placement transfixing a distal ulnar metaphysis
fracture.

## 2016-02-10 IMAGING — CR DG WRIST COMPLETE 3+V*L*
3 series · 3 of 3 positions shown · non-contrast
Comparison: Prior radiograph from 04/08/2014

CLINICAL DATA: post reduction

EXAM:
LEFT WRIST - COMPLETE 3+ VIEW

[x wrist pa left]
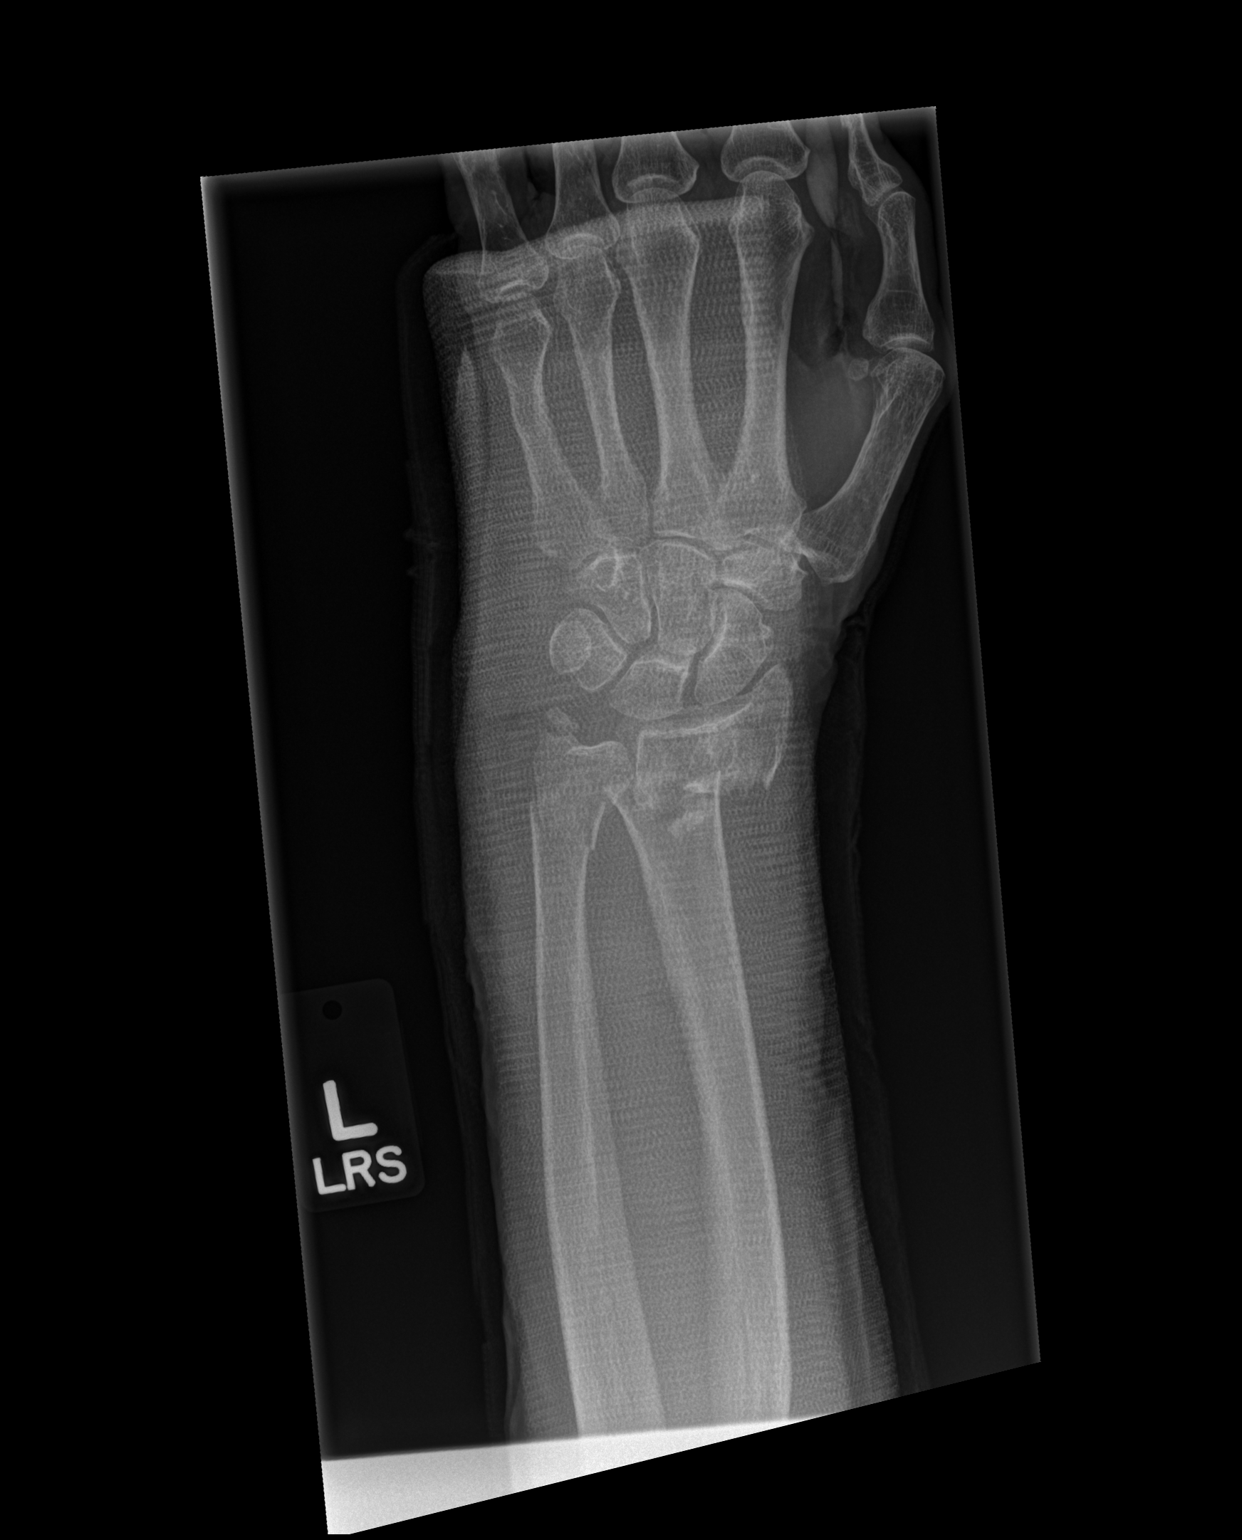

[x wrist obl left]
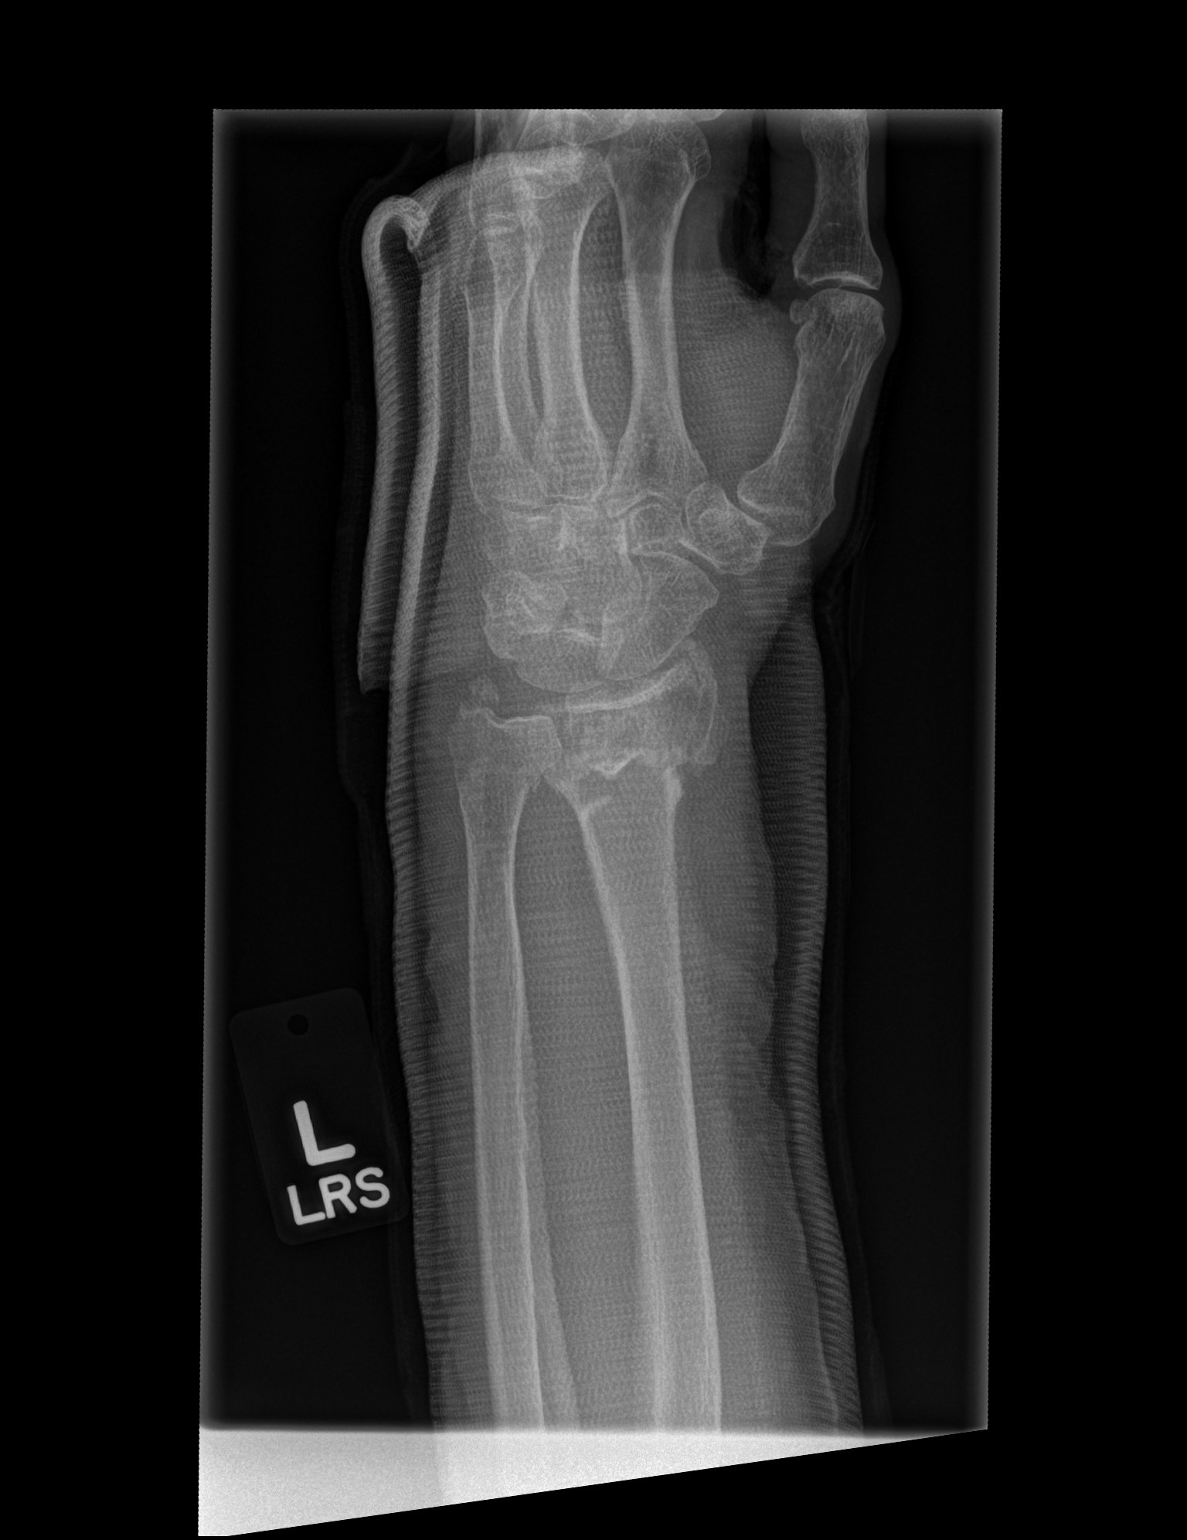

[x wrist lat left]
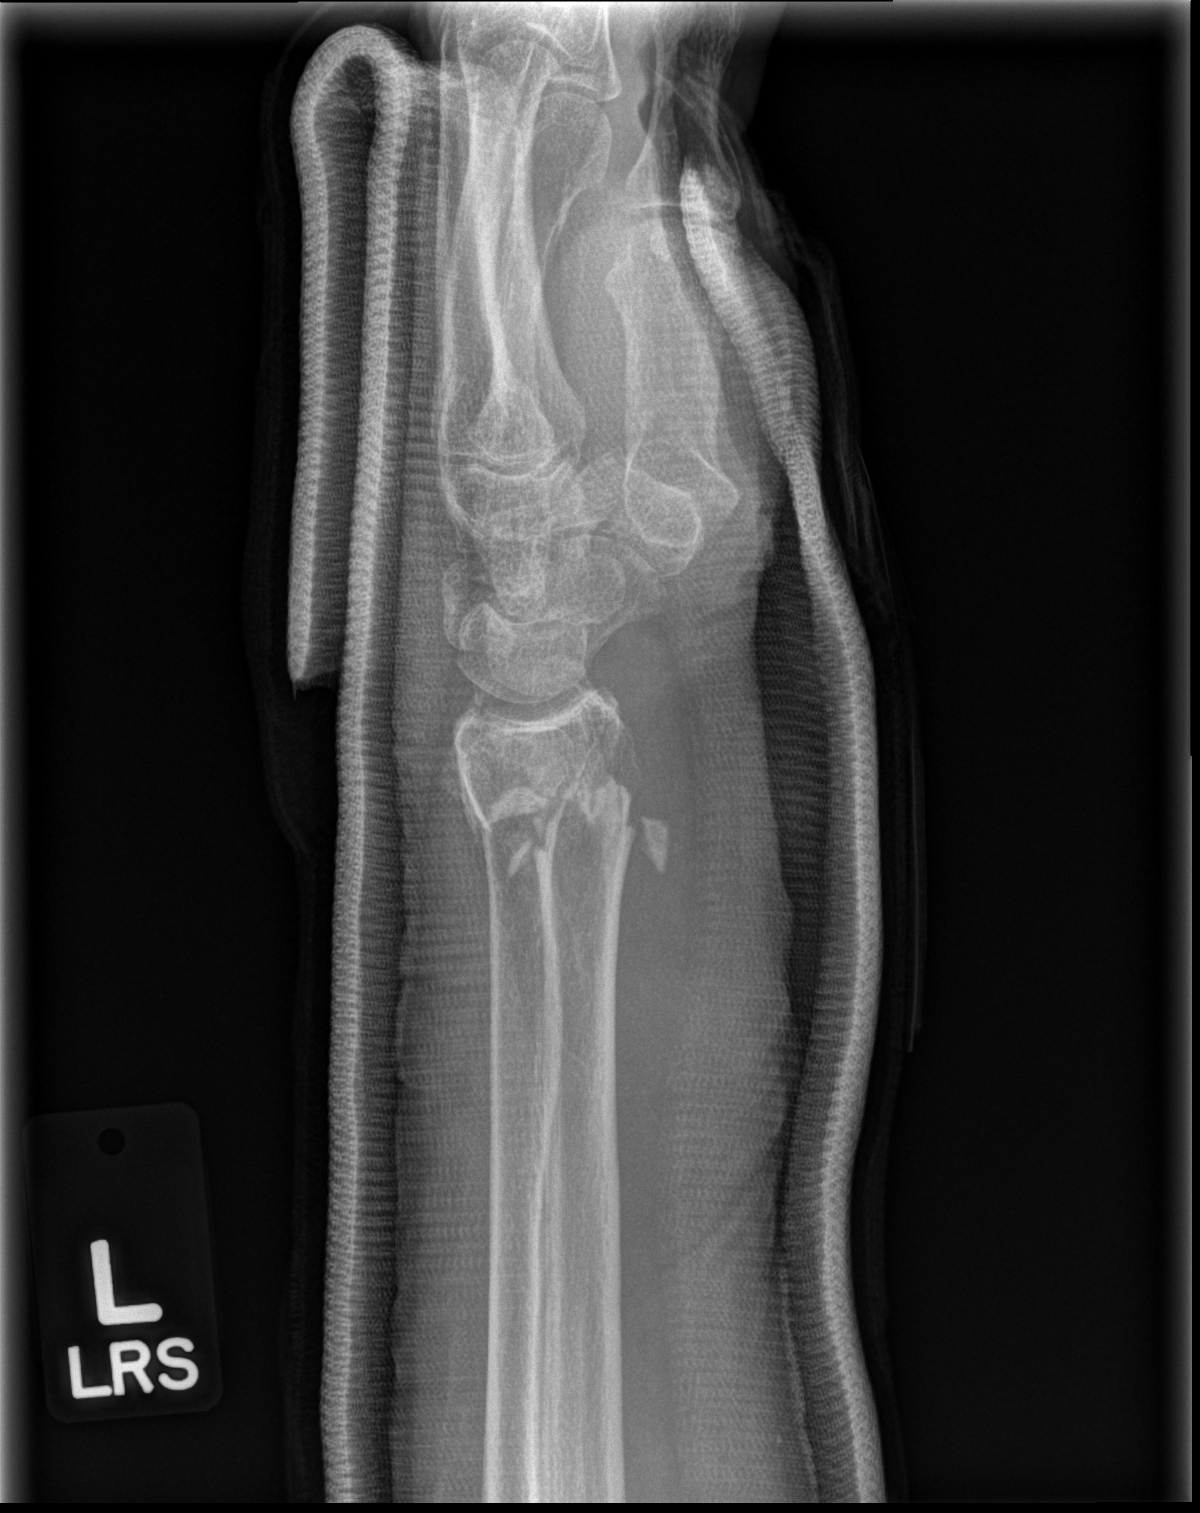

[3 of 3 positions shown; findings below may reference images not displayed]

FINDINGS: Splinting material now seen overlying the left wrist, somewhat
limiting evaluation for fine osseous detail.

Comminuted fractures of the distal left radius and ulna are again
seen, overall improved in alignment as compared to prior study.
There remains approximately in 9 mm of radial displacement at the
distal radial fracture. There is approximately 2 mm of radial
displacement at the distal ulnar fracture. Slight dorsal angulation
is present. Comminuted fracture fragments again seen. Diffuse soft
tissue swelling present. No new fracture.
IMPRESSION: Improved alignment about previously identified comminuted distal
radial and ulnar fractures status post splinting and reduction.

## 2016-02-11 IMAGING — CR DG CHEST 1V PORT
1 series · 1 of 1 positions shown · non-contrast
Comparison: Portable chest radiograph 12/17/2013.

CLINICAL DATA: hypoxia, SOB

EXAM:
PORTABLE CHEST - 1 VIEW

[portable]
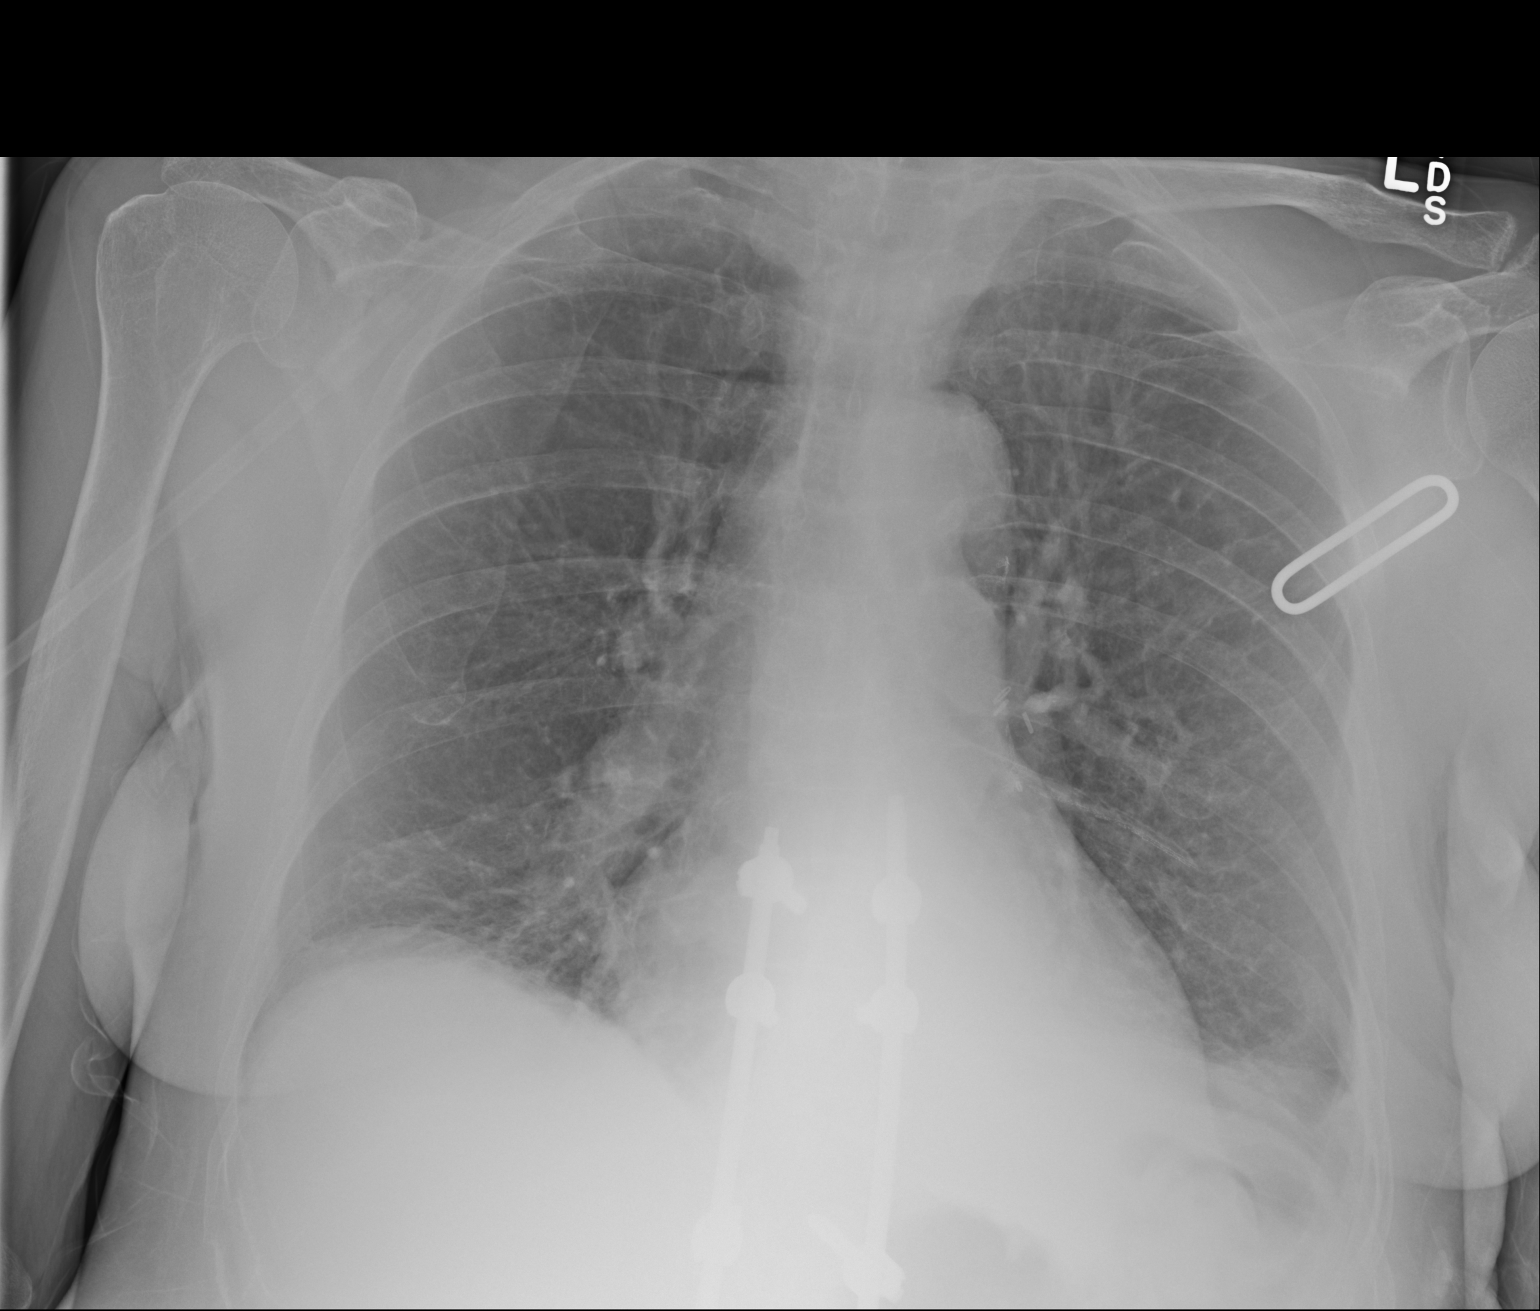

[1 of 1 positions shown; findings below may reference images not displayed]

FINDINGS: Low lung volumes. Cardiac silhouette stable. Bibasilar linear
densities left greater than right. No regions of consolidation nor
focal infiltrates. No acute osseous abnormalities.
IMPRESSION: Bibasilar scarring versus atelectasis left greater than right.
Otherwise no acute cardiopulmonary disease.

## 2016-02-16 ENCOUNTER — Encounter: Payer: Self-pay | Admitting: Pulmonary Disease

## 2016-02-16 ENCOUNTER — Other Ambulatory Visit (INDEPENDENT_AMBULATORY_CARE_PROVIDER_SITE_OTHER): Payer: PPO

## 2016-02-16 ENCOUNTER — Ambulatory Visit (INDEPENDENT_AMBULATORY_CARE_PROVIDER_SITE_OTHER): Payer: PPO | Admitting: Pulmonary Disease

## 2016-02-16 ENCOUNTER — Telehealth: Payer: Self-pay | Admitting: Pulmonary Disease

## 2016-02-16 VITALS — BP 102/68 | HR 90 | Ht 66.0 in | Wt 131.0 lb

## 2016-02-16 DIAGNOSIS — J209 Acute bronchitis, unspecified: Secondary | ICD-10-CM | POA: Insufficient documentation

## 2016-02-16 DIAGNOSIS — J9621 Acute and chronic respiratory failure with hypoxia: Secondary | ICD-10-CM | POA: Diagnosis not present

## 2016-02-16 DIAGNOSIS — R0602 Shortness of breath: Secondary | ICD-10-CM | POA: Diagnosis not present

## 2016-02-16 DIAGNOSIS — C3490 Malignant neoplasm of unspecified part of unspecified bronchus or lung: Secondary | ICD-10-CM | POA: Diagnosis not present

## 2016-02-16 DIAGNOSIS — J439 Emphysema, unspecified: Secondary | ICD-10-CM

## 2016-02-16 DIAGNOSIS — R5383 Other fatigue: Secondary | ICD-10-CM

## 2016-02-16 DIAGNOSIS — C3492 Malignant neoplasm of unspecified part of left bronchus or lung: Secondary | ICD-10-CM

## 2016-02-16 DIAGNOSIS — R0902 Hypoxemia: Secondary | ICD-10-CM | POA: Diagnosis not present

## 2016-02-16 DIAGNOSIS — J449 Chronic obstructive pulmonary disease, unspecified: Secondary | ICD-10-CM | POA: Diagnosis not present

## 2016-02-16 DIAGNOSIS — G4710 Hypersomnia, unspecified: Secondary | ICD-10-CM

## 2016-02-16 LAB — CBC WITH DIFFERENTIAL/PLATELET
Basophils Absolute: 0 10*3/uL (ref 0.0–0.1)
Basophils Relative: 0.4 % (ref 0.0–3.0)
Eosinophils Absolute: 0.2 10*3/uL (ref 0.0–0.7)
Eosinophils Relative: 1.9 % (ref 0.0–5.0)
HCT: 34 % — ABNORMAL LOW (ref 36.0–46.0)
Hemoglobin: 11.1 g/dL — ABNORMAL LOW (ref 12.0–15.0)
Lymphocytes Relative: 14.7 % (ref 12.0–46.0)
Lymphs Abs: 1.4 10*3/uL (ref 0.7–4.0)
MCHC: 32.6 g/dL (ref 30.0–36.0)
MCV: 80.7 fl (ref 78.0–100.0)
Monocytes Absolute: 0.6 10*3/uL (ref 0.1–1.0)
Monocytes Relative: 6.4 % (ref 3.0–12.0)
Neutro Abs: 7.2 10*3/uL (ref 1.4–7.7)
Neutrophils Relative %: 76.6 % (ref 43.0–77.0)
Platelets: 560 10*3/uL — ABNORMAL HIGH (ref 150.0–400.0)
RBC: 4.22 Mil/uL (ref 3.87–5.11)
RDW: 14.6 % (ref 11.5–15.5)
WBC: 9.4 10*3/uL (ref 4.0–10.5)

## 2016-02-16 LAB — BASIC METABOLIC PANEL
BUN: 13 mg/dL (ref 6–23)
CO2: 27 mEq/L (ref 19–32)
Calcium: 9.9 mg/dL (ref 8.4–10.5)
Chloride: 95 mEq/L — ABNORMAL LOW (ref 96–112)
Creatinine, Ser: 0.58 mg/dL (ref 0.40–1.20)
GFR: 107.85 mL/min (ref >60.00)
Glucose, Bld: 102 mg/dL — ABNORMAL HIGH (ref 70–99)
Potassium: 4.5 mEq/L (ref 3.5–5.1)
Sodium: 131 mEq/L — ABNORMAL LOW (ref 135–145)

## 2016-02-16 LAB — TSH: TSH: 3.63 u[IU]/mL (ref 0.35–4.50)

## 2016-02-16 MED ORDER — PREDNISONE 20 MG PO TABS: 20.0000 mg | ORAL_TABLET | Freq: Every day | ORAL | Status: DC

## 2016-02-16 MED ORDER — AEROCHAMBER MV MISC: Status: DC

## 2016-02-16 MED ORDER — DOXYCYCLINE HYCLATE 100 MG PO TABS: 100.0000 mg | ORAL_TABLET | Freq: Two times a day (BID) | ORAL | Status: DC

## 2016-02-16 NOTE — Assessment & Plan Note (Signed)
Recent congestion, cough, sputum. No fever. Doxy for 1 week and pred '20mg'$ /d x 5 d if not better. Pt to call if worse.

## 2016-02-16 NOTE — Assessment & Plan Note (Addendum)
Patient with severe COPD. PFT (01/2016)  FEV1  0.95  45%, DLCO 38%.  Spirometry (January 2016) FEV1 0.85 or 40%. Chest x-ray (October 2016) severe COPD changes. Better with symbicort.  Only  uses albuterol when necessary. Plan : 1. cont Symbicort, 160/4.5, 2 puffs twice a day. Rinse mouth with each use. Add spacer.  2. Continue albuterol only as when necessary. She was using albuterol as her maintenance medicine. 3.check alpha one level today 4. We'll need chest x-ray on follow-up. 5. Mentioned about pulmonary rehabilitation. She tells me she tries to walk everyday at least a mile. Something to consider on follow-up. She lives in [place]. 6. Up-to-date with pneumonia vaccine. Got the flu shot 2016. 7. Recent congestion/inc mucus. No fever. Will trx pred and abx if she worsens. Pt to call if not better.  8. Needs ONO on RA. If sig desatn, will need sleep study. If not, resume O2 at HS.

## 2016-02-16 NOTE — Assessment & Plan Note (Signed)
Stage IB bronchoalveolar carcinoma,. Well-differentiated the left lower lobe status post resection on 12/21/2010. She had 14 lymph nodes negative and no perineural space invasion with clear margins and no LV I.  Thus far she has no evidence for recurrent disease or new disease.   Chest x-ray October 2016-no obvious mass.  We'll need a repeat x-ray middle of 2017. Might need a chest CT scan.

## 2016-02-16 NOTE — Telephone Encounter (Signed)
Spoke with patient-she states she was told that someone from our office will contact her about the ONO order from [facility]. Pt is aware that order has been placed to [facility] but no information provided as to which DME she can expect to hear from about the ONO. Pt is aware that I will send message to [facility] to advise on company name.

## 2016-02-16 NOTE — Patient Instructions (Signed)
1. Continue Symbicort, 2 puffs twice a day. We will give you spacer for you to  use each time use your Symbicort. 2. Continue albuterol, 2 puffs every 4 hours as needed for dyspnea. 3. Check your oxygen level. Keep more than 88%. 4. We will prescribe you doxycycline, 100 mg per tablet, twice a day for 7 days and prednisone, 20 mg per day for 5 days if your symptoms worsen the next couple days. Please keep [place] informed. 5. We will order an overnight test to determine if she'll need oxygen when he sleepy at night. 6. We will do blood work today and we'll call you with results.  Return to clinic in 45mo.

## 2016-02-16 NOTE — Assessment & Plan Note (Signed)
Patient uses 2 L oxygen, nasal cannula, 24/7. DME is [facility]. Patient has a pulse oximeter. Keep O2 sats ration more than 88%. I anticipate, she'll probably just need oxygen with exertion. Her O2 sats on room air sitting was 92%. Plan for ONO on RA. May need sleep study.

## 2016-02-16 NOTE — Progress Notes (Signed)
Subjective:    Patient ID: [name], female    DOB: 1941/06/30, 75 y.o.   MRN: [id number]  HPI  This is the case of [name], 75 y.o. Female, who was referred by Dr. Jenna Sanford  in consultation regarding her COPD.   As you very well know, patient has a 60-pack-year smoking history, quit  in 2012  when she was diagnosd wih lung cancer.  She had left lower lobe lobectomy in 2012.Cancer free.  Patient was diagnosed with COPD in 19s. She was started on oxygen in Christmas of 2015. Her dyspnea has slowly gotten worse the last year. Has not been admitted nor has had a flare of COPD the last year.  She only uses albuterol daily for symptoms. Tried nebulizer before. She prefers MDIs.  Uses oxygen, 2 L, 24/7, since Christmas 2015. She has a pulse oximeter.   Patient had spirometry done in  January 2016 with an FEV1  of 0.85 or 40%. Chest x-ray in October/2016  Showing severe COPD changes.  DATA PFT (01/2016)  FEV1  0.95  45%,  DLCO 38%.   ROV (02/16/2016). Pt is here for f/u on her copd and sob. Since last seen, she states she feels better. Uses her symbicort. No issues. Recent more cough and congestion. No sick contacts. Checks O2 at daytime > usually > 88%. Uses O2 prn. Have not been admitted nor been on abx since last seen.   Review of Systems  Constitutional: Negative.  Negative for fever and unexpected weight change.  HENT: Positive for congestion, postnasal drip and rhinorrhea. Negative for dental problem, ear pain, nosebleeds, sinus pressure, sneezing, sore throat and trouble swallowing.   Eyes: Negative.  Negative for redness and itching.  Respiratory: Positive for cough, shortness of breath and wheezing.   Cardiovascular: Positive for palpitations and leg swelling.  Gastrointestinal: Negative.  Negative for nausea and vomiting.  Endocrine: Negative.   Genitourinary: Negative.  Negative for dysuria.  Musculoskeletal: Positive for joint swelling and arthralgias.    Skin: Negative.  Negative for rash.  Allergic/Immunologic: Negative.   Neurological: Negative.  Negative for headaches.  Hematological: Negative.  Does not bruise/bleed easily.  Psychiatric/Behavioral: Negative.  Negative for dysphoric mood. The patient is not nervous/anxious.   All other systems reviewed and are negative.    Objective:   Physical Exam   Vitals:  Filed Vitals:   02/16/16 1104  BP: 102/68  Pulse: 90  Height: '5\' 6"'$  (1.676 m)  Weight: 131 lb (59.421 kg)  SpO2: 91%    Constitutional/General:  Pleasant, well-nourished, well-developed, not in any distress,  Comfortably seating.  Well kempt. Chronically ill appearing.   Body mass index is 21.15 kg/(m^2). Wt Readings from Last 3 Encounters:  02/16/16 131 lb (59.421 kg)  12/20/15 134 lb 12.8 oz (61.145 kg)  11/14/15 135 lb 9.6 oz (61.508 kg)     HEENT: Pupils equal and reactive to light and accommodation. Anicteric sclerae. Normal nasal mucosa.   No oral  lesions,  mouth clear,  oropharynx clear, no postnasal drip. (-) Oral thrush. No dental caries.  Airway - Mallampati class III  Neck: No masses. Midline trachea. No JVD, (-) LAD. (-) bruits appreciated.  Respiratory/Chest: Grossly normal chest. (-) deformity. (-) Accessory muscle use.  Symmetric expansion. (-) Tenderness on palpation.  Resonant on percussion.  Diminished BS on both lower lung zones. occl wheezing,some bibasilar  crackles, (-) rhonchi (-) egophony  Cardiovascular: Regular rate and  rhythm, heart sounds normal, no  murmur or gallops, no peripheral edema  Gastrointestinal:  Normal bowel sounds. Soft, non-tender. No hepatosplenomegaly.  (-) masses.   Musculoskeletal:  Normal muscle tone. Normal gait.   Extremities: Grossly normal. (-) clubbing, cyanosis.  (-) edema  Skin: (-) rash,lesions seen.   Neurological/Psychiatric : alert, oriented to time, place, person. Normal mood and affect           Assessment & Plan:  COPD  (chronic obstructive pulmonary disease) Patient with severe COPD. PFT (01/2016)  FEV1  0.95  45%, DLCO 38%.  Spirometry (January 2016) FEV1 0.85 or 40%. Chest x-ray (October 2016) severe COPD changes. Better with symbicort.  Only  uses albuterol when necessary. Plan : 1. cont Symbicort, 160/4.5, 2 puffs twice a day. Rinse mouth with each use. Add spacer.  2. Continue albuterol only as when necessary. She was using albuterol as her maintenance medicine. 3.check alpha one level today 4. We'll need chest x-ray on follow-up. 5. Mentioned about pulmonary rehabilitation. She tells me she tries to walk everyday at least a mile. Something to consider on follow-up. She lives in [place]. 6. Up-to-date with pneumonia vaccine. Got the flu shot 2016. 7. Recent congestion/inc mucus. No fever. Will trx pred and abx if she worsens. Pt to call if not better.  8. Needs ONO on RA. If sig desatn, will need sleep study. If not, resume O2 at HS.    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 Well-differentiated the left lower lobe status post resection on 12/21/2010. She had 14 lymph nodes negative and no perineural space invasion with clear margins and no LV I.  Thus far she has no evidence for recurrent disease or new disease.   Chest x-ray October 2016-no obvious mass.  We'll need a repeat x-ray middle of 2017. Might need a chest CT scan.    Hypoxia Patient uses 2 L oxygen, nasal cannula, 24/7. DME is [facility]. Patient has a pulse oximeter. Keep O2 sats ration more than 88%. I anticipate, she'll probably just need oxygen with exertion. Her O2 sats on room air sitting was 92%. Plan for ONO on RA. May need sleep study.     Hypersomnia Has hypersomnia, snoring fatigue. Plan for ONO. If sig desatn, will need ONO.   Acute bronchitis Recent congestion, cough, sputum. No fever. Doxy for 1 week and pred '20mg'$ /d x 5 d if not better. Pt to call if worse.    I spent at  least 25 minutes with the patient today and more than 50% was spent counseling the patient regarding disease and management and facilitating labs and medications.          Patient will follow up with me in 4 os.    Monica Becton, MD Pulmonary and [place] Pager: [phone] Office: [phone], Fax: [phone]

## 2016-02-16 NOTE — Assessment & Plan Note (Signed)
Has hypersomnia, snoring fatigue. Plan for ONO. If sig desatn, will need ONO.

## 2016-02-17 ENCOUNTER — Telehealth: Payer: Self-pay | Admitting: Pulmonary Disease

## 2016-02-17 DIAGNOSIS — J209 Acute bronchitis, unspecified: Secondary | ICD-10-CM

## 2016-02-17 MED ORDER — DOXYCYCLINE HYCLATE 100 MG PO TABS: 100.0000 mg | ORAL_TABLET | Freq: Two times a day (BID) | ORAL | Status: DC

## 2016-02-17 MED ORDER — PREDNISONE 20 MG PO TABS: 20.0000 mg | ORAL_TABLET | Freq: Every day | ORAL | Status: AC

## 2016-02-17 NOTE — Telephone Encounter (Signed)
Pt was seen by AD on 02/16/16:  Patient Instructions     1. Continue Symbicort, 2 puffs twice a day. We will give you spacer for you to use each time use your Symbicort. 2. Continue albuterol, 2 puffs every 4 hours as needed for dyspnea. 3. Check your oxygen level. Keep more than 88%. 4. We will prescribe you doxycycline, 100 mg per tablet, twice a day for 7 days and prednisone, 20 mg per day for 5 days if your symptoms worsen the next couple days. Please keep [place] informed. 5. We will order an overnight test to determine if she'll need oxygen when he sleepy at night. 6. We will do blood work today and we'll call you with results.  Return to clinic in 5mo.   ------  Spoke with pt.  [facility]  [facility]  [facility]    Called [facility] spoke with NMerrilee Seashore  Doxy and prednisone rxs cancelled.  Nicked verbalized understanding and voiced no further questions or concerns at this time.  [facility] and voiced no further questions or concerns at this time.

## 2016-02-17 NOTE — Telephone Encounter (Signed)
I called and spoke with the patient.  She is aware that the Order was sent to [facility] (d/t her ins) and that if she has not heard from them the first part of next week to contact them.  I provided their phone number to the patient.

## 2016-02-22 LAB — ALPHA-1 ANTITRYPSIN PHENOTYPE: A-1 Antitrypsin: 197 mg/dL (ref 83–199)

## 2016-02-23 ENCOUNTER — Encounter: Payer: Self-pay | Admitting: Family Medicine

## 2016-02-29 ENCOUNTER — Emergency Department (HOSPITAL_COMMUNITY)
Admission: EM | Admit: 2016-02-29 | Discharge: 2016-02-29 | Disposition: A | Discharge status: Home / Self Care | Payer: PPO | Attending: Emergency Medicine | Admitting: Emergency Medicine

## 2016-02-29 ENCOUNTER — Emergency Department (HOSPITAL_COMMUNITY): Payer: PPO

## 2016-02-29 ENCOUNTER — Encounter (HOSPITAL_COMMUNITY): Payer: Self-pay | Admitting: *Deleted

## 2016-02-29 DIAGNOSIS — Z8679 Personal history of other diseases of the circulatory system: Secondary | ICD-10-CM | POA: Diagnosis not present

## 2016-02-29 DIAGNOSIS — E039 Hypothyroidism, unspecified: Secondary | ICD-10-CM | POA: Insufficient documentation

## 2016-02-29 DIAGNOSIS — Z8781 Personal history of (healed) traumatic fracture: Secondary | ICD-10-CM | POA: Diagnosis not present

## 2016-02-29 DIAGNOSIS — Z8711 Personal history of peptic ulcer disease: Secondary | ICD-10-CM | POA: Diagnosis not present

## 2016-02-29 DIAGNOSIS — J439 Emphysema, unspecified: Secondary | ICD-10-CM | POA: Insufficient documentation

## 2016-02-29 DIAGNOSIS — Z87891 Personal history of nicotine dependence: Secondary | ICD-10-CM | POA: Diagnosis not present

## 2016-02-29 DIAGNOSIS — R197 Diarrhea, unspecified: Secondary | ICD-10-CM | POA: Insufficient documentation

## 2016-02-29 DIAGNOSIS — Z8719 Personal history of other diseases of the digestive system: Secondary | ICD-10-CM | POA: Insufficient documentation

## 2016-02-29 DIAGNOSIS — Z85118 Personal history of other malignant neoplasm of bronchus and lung: Secondary | ICD-10-CM | POA: Insufficient documentation

## 2016-02-29 DIAGNOSIS — F329 Major depressive disorder, single episode, unspecified: Secondary | ICD-10-CM | POA: Diagnosis not present

## 2016-02-29 DIAGNOSIS — Z9981 Dependence on supplemental oxygen: Secondary | ICD-10-CM | POA: Insufficient documentation

## 2016-02-29 DIAGNOSIS — Z79899 Other long term (current) drug therapy: Secondary | ICD-10-CM | POA: Insufficient documentation

## 2016-02-29 DIAGNOSIS — R0981 Nasal congestion: Secondary | ICD-10-CM | POA: Diagnosis not present

## 2016-02-29 DIAGNOSIS — F419 Anxiety disorder, unspecified: Secondary | ICD-10-CM | POA: Insufficient documentation

## 2016-02-29 DIAGNOSIS — Z7951 Long term (current) use of inhaled steroids: Secondary | ICD-10-CM | POA: Diagnosis not present

## 2016-02-29 DIAGNOSIS — Z8669 Personal history of other diseases of the nervous system and sense organs: Secondary | ICD-10-CM | POA: Diagnosis not present

## 2016-02-29 DIAGNOSIS — J40 Bronchitis, not specified as acute or chronic: Secondary | ICD-10-CM

## 2016-02-29 DIAGNOSIS — R000 Tachycardia, unspecified: Secondary | ICD-10-CM | POA: Diagnosis not present

## 2016-02-29 MED ORDER — AZITHROMYCIN 250 MG PO TABS: ORAL_TABLET | ORAL | Status: DC

## 2016-02-29 NOTE — ED Notes (Signed)
Pt here with c/o productive cough and feeling bad.  Pt advises she has completed a course of Doxycycline and isn't any better.

## 2016-02-29 NOTE — ED Notes (Signed)
Pt here with complaint of congestion and productive cough since May 11th.  Pt on O2 prn and has been using it more frequently.

## 2016-02-29 NOTE — ED Provider Notes (Signed)
CSN: [id number]     Arrival date & time 02/29/16  9323 History   First MD Initiated Contact with Patient 02/29/16 1000     Chief Complaint  Patient presents with  . Nasal Congestion  . Respiratory Distress     (Consider location/radiation/quality/duration/timing/severity/associated sxs/prior Treatment) HPI Patient is a 75 year old female with a history of COPD, chronic respiratory failure, adenocarcinoma of the lung status post left lower lobe lobectomy that presents to the ED because her pulse ox read in the 70s this morning. Patient states that she was recently seen by her pulmonologist a few weeks ago and was given a course of doxycycline to combat a probable early case of COPD exacerbation. She states that she took the antibiotics which helped initially, but then symptoms returned 2 days ago. She is having increased coughing with sputum production. She reports no shortness of breath or chest pain. She hasn't no nausea vomiting and reports some diarrhea after taking the antibiotic.   Past Medical History  Diagnosis Date  . COPD (chronic obstructive pulmonary disease) ([place])   . Anxiety disorder   . Hypothyroidism   . PUD (peptic ulcer disease)     53yr  . Dysphagia   . IBS (irritable bowel syndrome)   . Diverticulosis   . Trigeminal neuralgia   . Diastolic dysfunction   . Allergic rhinitis   . [place] lung ([place]    left lung/surg only  . Hiatal hernia   . Aortic aneurysm ([place]   . Complication of anesthesia 2011    bloodpressure dropped during colonoscopy, not problems since  . Shortness of breath     with exertion  . 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 Compression fracture   . On home O2 12/15/2013    chronic hypoxia  . Depression   . Emphysema lung ([place] 11/08/2014   Past Surgical History  Procedure Laterality Date  . 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  Esophagogastroduodenoscopy  11/13/2009    w/dilation to 156m gastric ulceration (H.Pylori) s/p treatment  . Esophagogastroduodenoscopy  11/21/2009    distal esophageal web, gastritis  . Lung cancer surgery  12/2010    Left VATS, minithoracotomy, LLL superior segmentectomy  . Vocal cord surgery  02/06/2011    laryngoscopy with bilateral vocal cord Radiesse injection for vocal cord paralysis  . Abdominal hysterectomy    . Cholecystectomy    . Tubal ligation    . Cataract extraction    . Fatty tumor removal from lt groin    . Cataract extraction w/phaco  09/02/2012    Procedure: CATARACT EXTRACTION PHACO AND INTRAOCULAR LENS PLACEMENT (IOC);  Surgeon: MaElta Guadeloupe. ShGershon CraneMD;  Location: [facility];  Service: Ophthalmology;  Laterality: Left;  CDE:10.35  . Endarterectomy Right 08/21/2013    Procedure: RIGHT CAROTID ANEURYSM RESECTION;  Surgeon: ToRosetta PosnerMD;  [facility];  Service: Vascular;  Laterality: Right;  . Fracture surgery Left     hand and left ring finger  . Orif wrist fracture Left 04/09/2014    Procedure: OPEN REDUCTION INTERNAL FIXATION (ORIF) WRIST FRACTURE;  Surgeon: TiRenette ButtersMD;  [facility];  Laterality: Left;  . Percutaneous pinning Left 04/09/2014    Procedure: PERCUTANEOUS PINNING EXTREMITY;  Surgeon: TiRenette ButtersMD;  [facility];  Laterality: Left;  . Esophagogastroduodenoscopy  03/14/12    SLFTD:DUKGURKYH  in the distal esophagus/Mild gastritis/small HH. + H.pylori, prescribed Pylera. Finished treatment.   . Back surgery  January 13, 2014  . Colonoscopy N/A 11/23/2014    Procedure: COLONOSCOPY;  Surgeon: Danie Binder, MD;  [facility];  Service: Endoscopy;  Laterality: N/A;  930   . Yag laser application Left 34/28/7681    Procedure: YAG LASER APPLICATION;  Surgeon: Rutherford Guys, MD;  Location: [facility];  Service: Ophthalmology;  Laterality: Left;   Family History  Problem Relation Age of Onset  .  Liver cancer Sister   . Cancer Sister   . Pulmonary embolism Mother   . Heart attack Father   . Hypertension Father   . Cancer Brother   . Cancer Daughter   . Colon cancer Neg Hx    Social History  Substance Use Topics  . Smoking status: Former Smoker -- 0.50 packs/day for 20 years    Types: Cigarettes    Quit date: 12/21/2010  . Smokeless tobacco: Never Used     Comment: smoking cessation info given and reviewed   . Alcohol Use: 0.0 oz/week    0 Standard drinks or equivalent per week     Comment: seldom   OB History    Gravida Para Term Preterm AB TAB SAB Ectopic Multiple Living   '2 2 2       2     '$ Review of Systems  Constitutional: Negative for fever, chills and diaphoresis.  Respiratory: Positive for cough. Negative for chest tightness, shortness of breath and wheezing.   Cardiovascular: Negative for chest pain and leg swelling.  Gastrointestinal: Positive for diarrhea. Negative for nausea, vomiting and constipation.  Neurological: Negative for dizziness, syncope and light-headedness.  All other systems reviewed and are negative.     Allergies  Ciprofloxacin hcl; Iohexol; Prozac; and Sulfonamide derivatives  Home Medications   Prior to Admission medications   Medication Sig Start Date End Date Taking? Authorizing Provider  albuterol (PROVENTIL HFA;VENTOLIN HFA) 108 (90 Base) MCG/ACT inhaler Inhale 2 puffs into the lungs every 6 (six) hours as needed for wheezing or shortness of breath. 12/20/15  Yes Jose Angelo A Corrie Dandy, MD  budesonide-formoterol [facility]) 160-4.5 MCG/ACT inhaler Inhale 2 puffs into the lungs 2 (two) times daily. 12/20/15  Yes Jose Shirl Harris, MD  calcium citrate-vitamin D (CITRACAL+D) 315-200 MG-UNIT per tablet Take 1 tablet by mouth daily.    Yes Historical Provider, MD  citalopram (CELEXA) 40 MG tablet Take 1 tablet (40 mg total) by mouth at bedtime. 01/19/16  Yes [name], MD  cyclobenzaprine (FLEXERIL) 10 MG tablet Take 10 mg by  mouth 3 (three) times daily as needed for muscle spasms.   Yes Historical Provider, MD  Flaxseed, Linseed, (FLAXSEED OIL) OIL Take 1 capsule by mouth daily.   Yes Historical Provider, MD  levothyroxine (SYNTHROID, LEVOTHROID) 25 MCG tablet Take 1 tablet (25 mcg total) by mouth daily before breakfast. 12/06/14  Yes [name], MD  omeprazole (PRILOSEC) 40 MG capsule Take 40 mg by mouth daily.  10/11/15  Yes Historical Provider, MD  azithromycin (ZITHROMAX) 250 MG tablet Take 2 pills ('500mg'$ ) today. Then take 1 pill ('250mg'$ ) daily for four days starting tomorrow. 02/29/16   Mariel Aloe, MD  Spacer/Aero-Holding Chambers (AEROCHAMBER MV) inhaler Use as instructed 02/16/16   Rush Landmark, MD   BP 127/92 mmHg  Pulse 99  Temp(Src) 99.4 F (37.4 C) (Oral)  Resp 18  Ht '5\' 5"'$  (  1.651 m)  Wt 58.968 kg  BMI 21.63 kg/m2  SpO2 98% Physical Exam  Constitutional: She is oriented to person, place, and time. She appears well-developed and well-nourished.  Eyes: Conjunctivae and EOM are normal. Pupils are equal, round, and reactive to light.  Neck: Normal range of motion. Neck supple.  Cardiovascular: Normal rate, regular rhythm and normal pulses.  Exam reveals distant heart sounds.   No murmur heard. Pulmonary/Chest: Effort normal. She has decreased breath sounds in the left lower field.  Abdominal: Soft. Bowel sounds are normal. She exhibits no distension. There is no tenderness. There is no rebound.  Musculoskeletal: Normal range of motion.  Lymphadenopathy:    She has no cervical adenopathy.  Neurological: She is alert and oriented to person, place, and time.  Skin: Skin is warm.    ED Course  Procedures (including critical care time) Labs Review Labs Reviewed - No data to display  Imaging Review Dg Chest 2 View  02/29/2016  CLINICAL DATA:  Hypoxia and tachycardia EXAM: CHEST  2 VIEW COMPARISON:  08/01/2015 FINDINGS: Cardiac shadow is stable. A small hiatal hernia is noted. Fixation  rods in the thoracolumbar spine are again seen. Chronic interstitial changes are noted without focal infiltrate or sizable effusion. Compression deformity at T8 is again noted and stable. IMPRESSION: No active cardiopulmonary disease. Electronically Signed   By: Inez Catalina M.D.   On: 02/29/2016 10:52   I have personally reviewed and evaluated these images and lab results as part of my medical decision-making.   EKG Interpretation   Date/Time:  Wednesday Feb 29 2016 09:50:26 EDT Ventricular Rate:  99 PR Interval:  148 QRS Duration: 94 QT Interval:  350 QTC Calculation: 449 R Axis:   -7 Text Interpretation:  Sinus rhythm Anterior infarct, old Nonspecific T  wave abnormality, improved in Inferolateral leads Otherwise no significant  change Confirmed by KNOTT MD, DANIEL ([id number]) on 02/29/2016 10:15:13 AM      MDM   Final diagnoses:  Bronchitis    Patient symptoms consistent with bronchitis/COPD exacerbation. Currently not wheezing on exam. Patient was oxygenating very well on home 2 L of oxygen. No shortness of breath or chest pain. Chest x-ray with nonsignificant and stable findings. EKG unremarkable and stable. Wells PE score of 0. Will treat patient with a 5 day course of antibiotics. Recommend follow-up with pulmonologist.    Mariel Aloe, MD 02/29/16 1700  [name], MD 02/29/16 [phone]

## 2016-02-29 NOTE — Discharge Instructions (Signed)
We evaluated to for your coughing and low oxygen level. We will treat you with a round of antibiotics. Please take Azithromycin for 5 days. Please follow up with her pulmonologist for continued management of her pulmonary disease. If symptoms worsen, please do not hesitate to return for reevaluation.

## 2016-03-01 ENCOUNTER — Emergency Department (HOSPITAL_COMMUNITY)
Admission: EM | Admit: 2016-03-01 | Discharge: 2016-03-01 | Disposition: A | Discharge status: Home / Self Care | Payer: PPO | Attending: Emergency Medicine | Admitting: Emergency Medicine

## 2016-03-01 ENCOUNTER — Encounter (HOSPITAL_COMMUNITY): Payer: Self-pay | Admitting: Emergency Medicine

## 2016-03-01 DIAGNOSIS — E039 Hypothyroidism, unspecified: Secondary | ICD-10-CM | POA: Diagnosis not present

## 2016-03-01 DIAGNOSIS — J40 Bronchitis, not specified as acute or chronic: Secondary | ICD-10-CM | POA: Diagnosis not present

## 2016-03-01 DIAGNOSIS — Z87891 Personal history of nicotine dependence: Secondary | ICD-10-CM | POA: Diagnosis not present

## 2016-03-01 DIAGNOSIS — J449 Chronic obstructive pulmonary disease, unspecified: Secondary | ICD-10-CM | POA: Insufficient documentation

## 2016-03-01 DIAGNOSIS — F329 Major depressive disorder, single episode, unspecified: Secondary | ICD-10-CM | POA: Insufficient documentation

## 2016-03-01 DIAGNOSIS — R0602 Shortness of breath: Secondary | ICD-10-CM | POA: Diagnosis not present

## 2016-03-01 MED ORDER — ALBUTEROL SULFATE (2.5 MG/3ML) 0.083% IN NEBU: 2.5000 mg | INHALATION_SOLUTION | Freq: Four times a day (QID) | RESPIRATORY_TRACT | Status: DC | PRN

## 2016-03-01 MED ORDER — PREDNISONE 20 MG PO TABS: 20.0000 mg | ORAL_TABLET | Freq: Every day | ORAL | Status: DC

## 2016-03-01 MED ORDER — IPRATROPIUM-ALBUTEROL 0.5-2.5 (3) MG/3ML IN SOLN
3.0000 mL | Freq: Once | RESPIRATORY_TRACT | Status: AC
  Administered 2016-03-01: 3 mL via RESPIRATORY_TRACT
  Filled 2016-03-01: qty 3

## 2016-03-01 MED ORDER — PREDNISONE 20 MG PO TABS
40.0000 mg | ORAL_TABLET | Freq: Once | ORAL | Status: AC
  Administered 2016-03-01: 40 mg via ORAL
  Filled 2016-03-01: qty 2

## 2016-03-01 NOTE — ED Notes (Signed)
Pt c/o increasing shortness of breath over the last month with productive cough.  States she was tx with Doxycycline by her pulmonologist and was seen at [facility] ED yesterday and dx with bronchitis, but seems no better.  Pt is on home o2 at 2L.

## 2016-03-01 NOTE — Discharge Instructions (Signed)
Continue your antibiotic and inhaler. I will also prescribe prednisone and a nebulizer machine with the appropriate medication. You may find this helpful for your breathing and wheezing

## 2016-03-01 NOTE — ED Provider Notes (Signed)
CSN: [id number]     Arrival date & time 03/01/16  0718 History   First MD Initiated Contact with Patient 03/01/16 0730     Chief Complaint  Patient presents with  . Shortness of Breath     (Consider location/radiation/quality/duration/timing/severity/associated sxs/prior Treatment) HPI...Marland KitchenMarland KitchenProductive cough for 3 weeks. Patient was originally prescribed doxycycline which seemed to help, but this was ultimately discontinued. She was started on Zithromax yesterday at [facility]. She has been using her inhalers. Chest x-ray at that time was negative for pneumonia. She lives by herself in an apartment and is oxygen dependent. No fevers, sweats, chills. She is still eating and ambulatory. Severity of symptoms is moderate.  Past Medical History  Diagnosis Date  . COPD (chronic obstructive pulmonary disease) ([place])   . Anxiety disorder   . Hypothyroidism   . PUD (peptic ulcer disease)     52yr  . Dysphagia   . IBS (irritable bowel syndrome)   . Diverticulosis   . Trigeminal neuralgia   . Diastolic dysfunction   . Allergic rhinitis   . Adenocarcinoma of lung ([place] 12/[place]    left lung/surg only  . Hiatal hernia   . [place] ([place]   . Complication of anesthesia 2011    bloodpressure dropped during colonoscopy, not problems since  . Shortness of breath     with exertion  . 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 Compression fracture   . On home O2 12/15/2013    chronic hypoxia  . Depression   . Emphysema lung ([place] 11/08/2014   Past Surgical History  Procedure Laterality Date  . 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 Esophagogastroduodenoscopy  11/13/2009    w/dilation to 119m gastric ulceration (H.Pylori) s/p treatment  . Esophagogastroduodenoscopy  11/21/2009    distal esophageal web, gastritis  . Lung cancer surgery  12/2010    Left VATS, minithoracotomy, LLL superior segmentectomy  . Vocal cord surgery   02/06/2011    laryngoscopy with bilateral vocal cord Radiesse injection for vocal cord paralysis  . Abdominal hysterectomy    . Cholecystectomy    . Tubal ligation    . Cataract extraction    . Fatty tumor removal from lt groin    . Cataract extraction w/phaco  09/02/2012    Procedure: CATARACT EXTRACTION PHACO AND INTRAOCULAR LENS PLACEMENT (IOC);  Surgeon: MaElta Guadeloupe. ShGershon CraneMD;  [facility]: [facility];  Service: Ophthalmology;  Laterality: Left;  CDE:10.35  . Endarterectomy Right 08/21/2013    Procedure: RIGHT CAROTID ANEURYSM RESECTION;  Surgeon: ToRosetta PosnerMD;  [facility];  Service: Vascular;  Laterality: Right;  . Fracture surgery Left     hand and left ring finger  . Orif wrist fracture Left 04/09/2014    Procedure: OPEN REDUCTION INTERNAL FIXATION (ORIF) WRIST FRACTURE;  Surgeon: TiRenette ButtersMD;  [facility]: [facility]ics;  Laterality: Left;  . Percutaneous pinning Left 04/09/2014    Procedure: PERCUTANEOUS PINNING EXTREMITY;  Surgeon: TiRenette ButtersMD;  [facility]: [facility];  Laterality: Left;  . Esophagogastroduodenoscopy  03/14/12    SLVHQ:[id number] the distal esophagus/Mild gastritis/small HH. + H.pylori, prescribed Pylera. Finished treatment.   . Back surgery  January 13, 2014  . Colonoscopy N/A 11/23/2014    Procedure: COLONOSCOPY;  Surgeon: SaDanie BinderMD;  [facility];  Service: Endoscopy;  Laterality: N/A;  930   .  Yag laser application Left 91/47/8295    Procedure: YAG LASER APPLICATION;  Surgeon: Rutherford Guys, MD;  Location: [facility];  Service: Ophthalmology;  Laterality: Left;   Family History  Problem Relation Age of Onset  . Liver cancer Sister   . Cancer Sister   . Pulmonary embolism Mother   . Heart attack Father   . Hypertension Father   . Cancer Brother   . Cancer Daughter   . Colon cancer Neg Hx    Social History  Substance Use Topics  . Smoking status: Former Smoker -- 0.50 packs/day for 20 years     Types: Cigarettes    Quit date: 12/21/2010  . Smokeless tobacco: Never Used     Comment: smoking cessation info given and reviewed   . Alcohol Use: 0.0 oz/week    0 Standard drinks or equivalent per week     Comment: seldom   OB History    Gravida Para Term Preterm AB TAB SAB Ectopic Multiple Living   '2 2 2       2     '$ Review of Systems  All other systems reviewed and are negative.     Allergies  Ciprofloxacin hcl; Iohexol; Prozac; and Sulfonamide derivatives  Home Medications   Prior to Admission medications   Medication Sig Start Date End Date Taking? Authorizing Provider  albuterol (PROVENTIL HFA;VENTOLIN HFA) 108 (90 Base) MCG/ACT inhaler Inhale 2 puffs into the lungs every 6 (six) hours as needed for wheezing or shortness of breath. 12/20/15   [place], MD  albuterol (PROVENTIL) (2.5 MG/3ML) 0.083% nebulizer solution Take 3 mLs (2.5 mg total) by nebulization every 6 (six) hours as needed for wheezing or shortness of breath. 03/01/16   Nat Christen, MD  azithromycin (ZITHROMAX) 250 MG tablet Take 2 pills ('500mg'$ ) today. Then take 1 pill ('250mg'$ ) daily for four days starting tomorrow. 02/29/16   Mariel Aloe, MD  budesonide-formoterol [facility]) 160-4.5 MCG/ACT inhaler Inhale 2 puffs into the lungs 2 (two) times daily. 12/20/15   Jose Shirl Harris, MD  calcium citrate-vitamin D (CITRACAL+D) 315-200 MG-UNIT per tablet Take 1 tablet by mouth daily.     Historical Provider, MD  citalopram (CELEXA) 40 MG tablet Take 1 tablet (40 mg total) by mouth at bedtime. 01/19/16   [name], MD  cyclobenzaprine (FLEXERIL) 10 MG tablet Take 10 mg by mouth 3 (three) times daily as needed for muscle spasms.    Historical Provider, MD  Flaxseed, Linseed, (FLAXSEED OIL) OIL Take 1 capsule by mouth daily.    Historical Provider, MD  levothyroxine (SYNTHROID, LEVOTHROID) 25 MCG tablet Take 1 tablet (25 mcg total) by mouth daily before breakfast. 12/06/14   [name], MD   omeprazole (PRILOSEC) 40 MG capsule Take 40 mg by mouth daily.  10/11/15   Historical Provider, MD  predniSONE (DELTASONE) 20 MG tablet Take 1 tablet (20 mg total) by mouth daily with breakfast. 03/01/16   Nat Christen, MD  Spacer/Aero-Holding Chambers (AEROCHAMBER MV) inhaler Use as instructed 02/16/16   Rush Landmark, MD   BP 112/60 mmHg  Pulse 83  Temp(Src) 98.3 F (36.8 C) (Oral)  Resp 21  Ht '5\' 3"'$  (1.6 m)  Wt 130 lb (58.968 kg)  BMI 23.03 kg/m2  SpO2 96% Physical Exam  Constitutional: She is oriented to person, place, and time.  No respiratory distress  HENT:  Head: Normocephalic and atraumatic.  Eyes: Conjunctivae and EOM are normal. Pupils are  equal, round, and reactive to light.  Neck: Normal range of motion. Neck supple.  Cardiovascular: Normal rate and regular rhythm.   Pulmonary/Chest: Effort normal and breath sounds normal.  Abdominal: Soft. Bowel sounds are normal.  Musculoskeletal: Normal range of motion.  Neurological: She is alert and oriented to person, place, and time.  Skin: Skin is warm and dry.  Psychiatric: She has a normal mood and affect. Her behavior is normal.  Nursing note and vitals reviewed.   ED Course  Procedures (including critical care time) Labs Review Labs Reviewed  CULTURE, EXPECTORATED SPUTUM-ASSESSMENT    Imaging Review Dg Chest 2 View  02/29/2016  CLINICAL DATA:  Hypoxia and tachycardia EXAM: CHEST  2 VIEW COMPARISON:  08/01/2015 FINDINGS: Cardiac shadow is stable. A small hiatal hernia is noted. Fixation rods in the thoracolumbar spine are again seen. Chronic interstitial changes are noted without focal infiltrate or sizable effusion. Compression deformity at T8 is again noted and stable. IMPRESSION: No active cardiopulmonary disease. Electronically Signed   By: Inez Catalina M.D.   On: 02/29/2016 10:52   I have personally reviewed and evaluated these images and lab results as part of my medical decision-making.   EKG  Interpretation   Date/Time:  Thursday Mar 01 2016 07:35:14 EDT Ventricular Rate:  102 PR Interval:  152 QRS Duration: 98 QT Interval:  333 QTC Calculation: 434 R Axis:   -8 Text Interpretation:  Sinus tachycardia Anterior infarct, old Borderline  repolarization abnormality Baseline wander in lead(s) V6 Confirmed by Inez Stantz   MD, Moira Umholtz ([id number]) on 03/01/2016 8:15:00 AM      MDM   Final diagnoses:  Bronchitis    Patient is hemodynamically stable. Her vital signs are acceptable. Chest x-ray reviewed from yesterday. No pneumonia noted. She was given a nebulizer treatment in the ED and prednisone orally. Discharge medication prednisone and albuterol nebulizer via machine    Nat Christen, MD 03/01/16 [phone]

## 2016-03-02 ENCOUNTER — Encounter: Payer: Self-pay | Admitting: Family Medicine

## 2016-03-04 DIAGNOSIS — R0602 Shortness of breath: Secondary | ICD-10-CM | POA: Diagnosis not present

## 2016-03-04 DIAGNOSIS — R0902 Hypoxemia: Secondary | ICD-10-CM | POA: Diagnosis not present

## 2016-03-04 DIAGNOSIS — J9621 Acute and chronic respiratory failure with hypoxia: Secondary | ICD-10-CM | POA: Diagnosis not present

## 2016-03-04 DIAGNOSIS — C3490 Malignant neoplasm of unspecified part of unspecified bronchus or lung: Secondary | ICD-10-CM | POA: Diagnosis not present

## 2016-03-04 LAB — CULTURE, RESPIRATORY W GRAM STAIN

## 2016-03-05 ENCOUNTER — Telehealth (HOSPITAL_BASED_OUTPATIENT_CLINIC_OR_DEPARTMENT_OTHER): Payer: Self-pay | Admitting: Emergency Medicine

## 2016-03-05 NOTE — Telephone Encounter (Signed)
Post ED Visit - Positive Culture Follow-up  Culture report reviewed by antimicrobial stewardship pharmacist:  '[]'$  Elenor Quinones, Pharm.D. '[]'$  Heide Guile, Pharm.D., BCPS '[]'$  Parks Neptune, Pharm.D. '[]'$  Alycia Rossetti, Pharm.D., BCPS '[x]'$  [place], [place].D., BCPS, AAHIVP '[]'$  Legrand Como, Pharm.D., BCPS, AAHIVP '[]'$  Milus Glazier, Pharm.D. '[]'$  Stephens November, [place].D.  Positive respiratory culture Treated with azthromycin ,organism sensitive to the same and no further patient follow-up is required at this time.  Hazle Nordmann 03/05/2016, 9:14 AM

## 2016-03-06 ENCOUNTER — Telehealth: Payer: Self-pay | Admitting: Pulmonary Disease

## 2016-03-06 NOTE — Telephone Encounter (Signed)
Patient states that she had ONO test done last week, calling to get results.   Sent message to [facility] to send ONO results. Awaiting results.

## 2016-03-08 ENCOUNTER — Encounter: Payer: Self-pay | Admitting: Pulmonary Disease

## 2016-03-08 NOTE — Telephone Encounter (Signed)
Called [facility] to have ONO results faxed to our office- was on hold X10 minutes while rep was looking for results.  Wcb.

## 2016-03-08 NOTE — Telephone Encounter (Signed)
Patient called back for results. She can be reached at [phone]

## 2016-03-08 NOTE — Telephone Encounter (Signed)
Results not in chart. Called [facility] and had to leave message with answering service to request results be faxed over. Spoke with pt and advised. Also let her know that if she feels her O2 levels are significantly low she can call for an appt to be seen and evaluated. Pt voiced understanding.  Will hold in triage to check on ONO results.

## 2016-03-09 NOTE — Telephone Encounter (Signed)
ONO results not in AD's folder up front, or in his cubby. Called [facility] X3 for ONO results.  These are being refaxed to verifiex fax # at [facility].   Will continue to hold in triage to follow up on results.

## 2016-03-12 ENCOUNTER — Encounter: Payer: Self-pay | Admitting: Pulmonary Disease

## 2016-03-12 NOTE — Telephone Encounter (Signed)
Patient called regarding results -prm

## 2016-03-12 NOTE — Telephone Encounter (Signed)
Attempted to call the pt three separate times. Line was busy. ONO results have been placed in AD's look at folder to address when he returns to the office on 03/13/16.

## 2016-03-13 ENCOUNTER — Telehealth: Payer: Self-pay | Admitting: Pulmonary Disease

## 2016-03-13 DIAGNOSIS — H524 Presbyopia: Secondary | ICD-10-CM | POA: Diagnosis not present

## 2016-03-13 DIAGNOSIS — Z961 Presence of intraocular lens: Secondary | ICD-10-CM | POA: Diagnosis not present

## 2016-03-13 NOTE — Telephone Encounter (Signed)
Please advise Dr Corrie Dandy on ONO results. These are in your look at folder. Thanks.

## 2016-03-13 NOTE — Telephone Encounter (Signed)
   I saw ONO.  Vita Barley will call pt with results.   AD

## 2016-03-13 NOTE — Telephone Encounter (Signed)
LMTCB

## 2016-03-13 NOTE — Telephone Encounter (Signed)
LMTCB  Jose Shirl Harris, MD at 03/13/2016 10:44 AM     Status: Signed       Expand All Collapse All     ONO on RA : O2 sats <= 88% was 8 mins 30 sec. ONO pattern suggestive of mild OSA.  1. pls tell pt she needs to be wearing her O2 2L at hs. She is on O2 2L 24/7.  2. We will hold off on sleep study for now unless she gets more symptomatic -- has fatigue, sleepiness, tiredness. At that point, we can order a sleep study, unless she is symptomatic now.   Thanks.  AD

## 2016-03-13 NOTE — Telephone Encounter (Signed)
Patient returning our call-prm

## 2016-03-13 NOTE — Telephone Encounter (Signed)
Do you have these results? Will you call pt?

## 2016-03-13 NOTE — Telephone Encounter (Signed)
Patient notified of ONO results. Patient states she would like to hold off on the Sleep Study for now.  Patient states she will call us back if she decides to do Sleep Study. Nothing further needed.

## 2016-03-13 NOTE — Telephone Encounter (Signed)
   ONO on RA : O2 sats <= 88% was 8 mins 30 sec. ONO pattern suggestive of mild OSA.  1. pls tell pt she needs to be wearing her O2 2L at hs.  She is on O2 2L 24/7.  2.  We will hold off on sleep study for now unless she gets more symptomatic -- has fatigue, sleepiness, tiredness. At that point, we can order a sleep study, unless she is symptomatic now.   Thanks.  AD

## 2016-03-14 ENCOUNTER — Other Ambulatory Visit: Payer: Self-pay

## 2016-03-14 MED ORDER — LEVOTHYROXINE SODIUM 25 MCG PO TABS: 25.0000 ug | ORAL_TABLET | Freq: Every day | ORAL | Status: DC

## 2016-03-14 NOTE — Telephone Encounter (Signed)
Attempted to contact pt. Received a message that my call could not be completed as dialed. Will try back later.

## 2016-03-15 NOTE — Telephone Encounter (Signed)
ATC pt, line busy, wcb

## 2016-03-16 ENCOUNTER — Encounter: Payer: Self-pay | Admitting: Family Medicine

## 2016-03-16 MED ORDER — LEVOTHYROXINE SODIUM 25 MCG PO TABS: 25.0000 ug | ORAL_TABLET | Freq: Every day | ORAL | Status: DC

## 2016-03-16 NOTE — Telephone Encounter (Signed)
ATC x 3 - line busy.  WCB

## 2016-03-19 NOTE — Telephone Encounter (Signed)
Attempted to call pt. Line was busy. Will try back. 

## 2016-03-20 NOTE — Telephone Encounter (Signed)
[name], CMA at 03/13/2016 5:12 PM     Status: Signed       Expand All Collapse All   Patient notified of ONO results. Patient states she would like to hold off on the Sleep Study for now.  Patient states she will call us back if she decides to do Sleep Study. Nothing further needed.

## 2016-03-22 ENCOUNTER — Encounter: Payer: Self-pay | Admitting: Family Medicine

## 2016-03-26 MED ORDER — ALPRAZOLAM 0.5 MG PO TABS: 0.5000 mg | ORAL_TABLET | Freq: Two times a day (BID) | ORAL | Status: DC | PRN

## 2016-03-26 NOTE — Telephone Encounter (Signed)
Medication called/sent to requested pharmacy  

## 2016-03-30 ENCOUNTER — Encounter: Payer: Self-pay | Admitting: Family Medicine

## 2016-03-30 ENCOUNTER — Ambulatory Visit (INDEPENDENT_AMBULATORY_CARE_PROVIDER_SITE_OTHER): Payer: PPO | Admitting: Family Medicine

## 2016-03-30 VITALS — BP 110/68 | HR 74 | Temp 98.1°F | Resp 18 | Ht 66.0 in | Wt 126.0 lb

## 2016-03-30 DIAGNOSIS — E871 Hypo-osmolality and hyponatremia: Secondary | ICD-10-CM | POA: Diagnosis not present

## 2016-03-30 DIAGNOSIS — J441 Chronic obstructive pulmonary disease with (acute) exacerbation: Secondary | ICD-10-CM

## 2016-03-30 LAB — CBC WITH DIFFERENTIAL/PLATELET
Basophils Absolute: 77 cells/uL (ref 0–200)
Basophils Relative: 1 %
Eosinophils Absolute: 616 cells/uL — ABNORMAL HIGH (ref 15–500)
Eosinophils Relative: 8 %
HCT: 38.3 % (ref 35.0–45.0)
Hemoglobin: 11.7 g/dL — ABNORMAL LOW (ref 12.0–15.0)
Lymphocytes Relative: 23 %
Lymphs Abs: 1771 cells/uL (ref 850–3900)
MCH: 24.7 pg — ABNORMAL LOW (ref 27.0–33.0)
MCHC: 30.5 g/dL — ABNORMAL LOW (ref 32.0–36.0)
MCV: 80.8 fL (ref 80.0–100.0)
MPV: 8.6 fL (ref 7.5–12.5)
Monocytes Absolute: 770 cells/uL (ref 200–950)
Monocytes Relative: 10 %
Neutro Abs: 4466 cells/uL (ref 1500–7800)
Neutrophils Relative %: 58 %
Platelets: 508 10*3/uL — ABNORMAL HIGH (ref 140–400)
RBC: 4.74 MIL/uL (ref 3.80–5.10)
RDW: 15.8 % — ABNORMAL HIGH (ref 11.0–15.0)
WBC: 7.7 10*3/uL (ref 3.8–10.8)

## 2016-03-30 LAB — COMPLETE METABOLIC PANEL WITH GFR
ALT: 17 U/L (ref 6–29)
AST: 22 U/L (ref 10–35)
Albumin: 4.4 g/dL (ref 3.6–5.1)
Alkaline Phosphatase: 86 U/L (ref 33–130)
BUN: 6 mg/dL — ABNORMAL LOW (ref 7–25)
CO2: 27 mmol/L (ref 20–31)
Calcium: 9.7 mg/dL (ref 8.6–10.4)
Chloride: 95 mmol/L — ABNORMAL LOW (ref 98–110)
Creat: 0.54 mg/dL — ABNORMAL LOW (ref 0.60–0.93)
GFR, Est African American: >89 mL/min (ref >=60)
GFR, Est Non African American: >89 mL/min (ref >=60)
Glucose, Bld: 88 mg/dL (ref 70–99)
Potassium: 4.9 mmol/L (ref 3.5–5.3)
Sodium: 137 mmol/L (ref 135–146)
Total Bilirubin: 0.4 mg/dL (ref 0.2–1.2)
Total Protein: 7.4 g/dL (ref 6.1–8.1)

## 2016-03-30 MED ORDER — AZITHROMYCIN 250 MG PO TABS: ORAL_TABLET | ORAL | Status: DC

## 2016-03-30 MED ORDER — CEFUROXIME AXETIL 500 MG PO TABS: 500.0000 mg | ORAL_TABLET | Freq: Two times a day (BID) | ORAL | Status: DC

## 2016-03-30 MED ORDER — PREDNISONE 20 MG PO TABS: ORAL_TABLET | ORAL | Status: DC

## 2016-03-30 NOTE — Progress Notes (Signed)
Subjective:    Patient ID: [name], female    DOB: [birth year], 75 y.o.   MRN: [id number]  HPI Patient reports worsening cough over the last 4 weeks. She's been treated for bronchitis with doxycycline, prednisone, and a Z-Pak without improvement. She reports increasing shortness of breath and wheezing. She is back on her oxygen. Her pulmonologist just started her on Symbicort. On examination today she has diminished breath sounds bilaterally with faint bibasilar crackles and rhonchorous breath sounds and some expiratory wheezing. In late May had a sputum culture that revealed Haemophilus influenza. Also reports right-sided sciatica with pain radiating from her posterior hip down to her knee. Overdue to recheck a sodium level Past Medical History  Diagnosis Date  . COPD (chronic obstructive pulmonary disease) ([place])   . Anxiety disorder   . Hypothyroidism   . PUD (peptic ulcer disease)     22yr  . Dysphagia   . IBS (irritable bowel syndrome)   . Diverticulosis   . Trigeminal neuralgia   . Diastolic dysfunction   . Allergic rhinitis   . Adenocarcinoma of lung ([place] 12[place]    left lung/surg only  . Hiatal hernia   . Aortic aneurysm ([place]   . Complication of anesthesia 2011    bloodpressure dropped during colonoscopy, not problems since  . Shortness of breath     with exertion  . 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[place]     corrected by surgery 08/21/13  . Compression fracture   . On home O2 12/15/2013    chronic hypoxia  . Depression   . [place]physema lung ([place] 11/08/2014   Past Surgical History  Procedure Laterality Date  . 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 Esophagogastroduodenoscopy  11/13/2009    w/dilation to 156m gastric ulceration (H.Pylori) s/p treatment  . Esophagogastroduodenoscopy  11/21/2009    distal esophageal web, gastritis  . Lung cancer surgery  12/2010    Left VATS, minithoracotomy, LLL superior segmentectomy  . Vocal cord  surgery  02/06/2011    laryngoscopy with bilateral vocal cord Radiesse injection for vocal cord paralysis  . Abdominal hysterectomy    . Cholecystectomy    . Tubal ligation    . Cataract extraction    . Fatty tumor removal from lt groin    . Cataract extraction w/phaco  09/02/2012    Procedure: CATARACT EXTRACTION PHACO AND INTRAOCULAR LENS PLACEMENT (IOC);  Surgeon: MaElta Guadeloupe. ShGershon CraneMD;  Location: [facility];  Service: Ophthalmology;  Laterality: Left;  CDE:10.35  . Endarterectomy Right 08/21/2013    Procedure: RIGHT CAROTID ANEURYSM RESECTION;  Surgeon: ToRosetta PosnerMD;  [facility];  Service: Vascular;  Laterality: Right;  . Fracture surgery Left     hand and left ring finger  . Orif wrist fracture Left 04/09/2014    Procedure: OPEN REDUCTION INTERNAL FIXATION (ORIF) WRIST FRACTURE;  Surgeon: TiRenette ButtersMD;  [facility]: [facility];  Laterality: Left;  . Percutaneous pinning Left 04/09/2014    Procedure: PERCUTANEOUS PINNING EXTREMITY;  Surgeon: TiRenette ButtersMD;  [facility]: [facility];  Laterality: Left;  . Esophagogastroduodenoscopy  03/14/12    SLBOF:BPZWCHENIn the distal esophagus/Mild gastritis/small HH. + H.pylori, prescribed Pylera. Finished treatment.   . Back surgery  January 13, 2014  . Colonoscopy N/A 11/23/2014    Procedure: COLONOSCOPY;  Surgeon: SaDanie BinderMD;  [facility];  Service: Endoscopy;  Laterality: N/A;  930   . Yag laser application Left 92/42/6834    Procedure: YAG LASER APPLICATION;  Surgeon: Rutherford Guys, MD;  Location: [facility];  Service: Ophthalmology;  Laterality: Left;   Current Outpatient Prescriptions on File Prior to Visit  Medication Sig Dispense Refill  . albuterol (PROVENTIL HFA;VENTOLIN HFA) 108 (90 Base) MCG/ACT inhaler Inhale 2 puffs into the lungs every 6 (six) hours as needed for wheezing or shortness of breath. 1 Inhaler 6  . albuterol (PROVENTIL) (2.5 MG/3ML) 0.083% nebulizer solution Take 3  mLs (2.5 mg total) by nebulization every 6 (six) hours as needed for wheezing or shortness of breath. 75 mL 12  . ALPRAZolam (XANAX) 0.5 MG tablet Take 1 tablet (0.5 mg total) by mouth 2 (two) times daily as needed for anxiety or sleep. 60 tablet 1  . budesonide-formoterol (SYMBICORT) 160-4.5 MCG/ACT inhaler Inhale 2 puffs into the lungs 2 (two) times daily. 1 Inhaler 6  . calcium citrate-vitamin D (CITRACAL+D) 315-200 MG-UNIT per tablet Take 1 tablet by mouth daily.     . citalopram (CELEXA) 40 MG tablet Take 1 tablet (40 mg total) by mouth at bedtime. 90 tablet 4  . cyclobenzaprine (FLEXERIL) 10 MG tablet Take 10 mg by mouth 3 (three) times daily as needed for muscle spasms.    . Flaxseed, Linseed, (FLAXSEED OIL) OIL Take 1 capsule by mouth daily.    Marland Kitchen levothyroxine (SYNTHROID, LEVOTHROID) 25 MCG tablet Take 1 tablet (25 mcg total) by mouth daily before breakfast. 90 tablet 4  . omeprazole (PRILOSEC) 40 MG capsule Take 40 mg by mouth daily.     Marland Kitchen Spacer/Aero-Holding Chambers (AEROCHAMBER MV) inhaler Use as instructed 1 each 0   No current facility-administered medications on file prior to visit.   Allergies  Allergen Reactions  . Ciprofloxacin Hcl Hives  . Iohexol      Desc: Per [facility], pt is allergic to IV contrast, no type of reaction was available. Pt cannot remember but first reacted around 15 yrs ago from an IVP. Notes were date from 2009 at urology center., Onset Date: [id number]   . Prozac [Fluoxetine Hcl] Rash  . Sulfonamide Derivatives Hives and Rash   Social History   Social History  . Marital Status: Divorced    Spouse Name: N/A  . Number of Children: N/A  . Years of Education: N/A   Occupational History  . Not on file.   Social History Main Topics  . Smoking status: Former Smoker -- 0.50 packs/day for 20 years    Types: Cigarettes    Quit date: 12/21/2010  . Smokeless tobacco: Never Used     Comment: smoking cessation info given and reviewed   . Alcohol  Use: 0.0 oz/week    0 Standard drinks or equivalent per week     Comment: seldom  . Drug Use: No  . Sexual Activity: Yes    Birth Control/ Protection: Surgical   Other Topics Concern  . Not on file   Social History Narrative      Review of Systems  All other systems reviewed and are negative.      Objective:   Physical Exam  Neck: No JVD present.  Cardiovascular: Normal rate, regular rhythm and normal heart sounds.   Pulmonary/Chest: Effort normal. She has wheezes. She has rales.  Abdominal: Soft. Bowel sounds are normal.  Musculoskeletal: She exhibits no edema.  Lymphadenopathy:    She has no cervical adenopathy.  Vitals reviewed.         Assessment &  Plan:  Hyponatremia - Plan: CBC with Differential/Platelet, COMPLETE METABOLIC PANEL WITH GFR  COPD exacerbation (HCC) - Plan: cefUROXime (CEFTIN) 500 MG tablet, azithromycin (ZITHROMAX) 250 MG tablet, predniSONE (DELTASONE) 20 MG tablet  Given her history of hyponatremia will monitor her sodium level. She has a COPD exacerbation and possibly community-acquired pneumonia. Given her history of allergies to fluoroquinolones, I will treat the patient with Ceftin 500 mg by mouth twice a day for 10 days in addition to a Z-Pak to cover atypicals. I will also treat her for a COPD exacerbation with prednisone 40 mg a day for 7 days. Recheck in one week or sooner if worse. I believe the prednisone will likely help her sciatica

## 2016-04-01 DIAGNOSIS — R0602 Shortness of breath: Secondary | ICD-10-CM | POA: Diagnosis not present

## 2016-04-01 DIAGNOSIS — J449 Chronic obstructive pulmonary disease, unspecified: Secondary | ICD-10-CM | POA: Diagnosis not present

## 2016-04-02 ENCOUNTER — Encounter: Payer: Self-pay | Admitting: Pulmonary Disease

## 2016-04-02 ENCOUNTER — Encounter: Payer: Self-pay | Admitting: Family Medicine

## 2016-04-04 DIAGNOSIS — R0602 Shortness of breath: Secondary | ICD-10-CM | POA: Diagnosis not present

## 2016-04-04 DIAGNOSIS — C3490 Malignant neoplasm of unspecified part of unspecified bronchus or lung: Secondary | ICD-10-CM | POA: Diagnosis not present

## 2016-04-04 DIAGNOSIS — J9621 Acute and chronic respiratory failure with hypoxia: Secondary | ICD-10-CM | POA: Diagnosis not present

## 2016-04-04 DIAGNOSIS — R0902 Hypoxemia: Secondary | ICD-10-CM | POA: Diagnosis not present

## 2016-04-09 ENCOUNTER — Encounter: Payer: Self-pay | Admitting: Family Medicine

## 2016-04-09 ENCOUNTER — Ambulatory Visit: Payer: PPO | Admitting: Family Medicine

## 2016-04-09 ENCOUNTER — Ambulatory Visit (INDEPENDENT_AMBULATORY_CARE_PROVIDER_SITE_OTHER): Payer: PPO | Admitting: Family Medicine

## 2016-04-09 VITALS — BP 110/62 | HR 98 | Temp 98.3°F | Resp 16 | Ht 66.0 in | Wt 127.0 lb

## 2016-04-09 DIAGNOSIS — M25551 Pain in right hip: Secondary | ICD-10-CM

## 2016-04-09 DIAGNOSIS — J449 Chronic obstructive pulmonary disease, unspecified: Secondary | ICD-10-CM | POA: Diagnosis not present

## 2016-04-09 NOTE — Progress Notes (Signed)
Subjective:    Patient ID: [name], female    DOB: 01-12-1941, 75 y.o.   MRN: [id number]  HPI 03/30/16 Patient reports worsening cough over the last 4 weeks. She's been treated for bronchitis with doxycycline, prednisone, and a Z-Pak without improvement. She reports increasing shortness of breath and wheezing. She is back on her oxygen. Her pulmonologist just started her on Symbicort. On examination today she has diminished breath sounds bilaterally with faint bibasilar crackles and rhonchorous breath sounds and some expiratory wheezing. In late May had a sputum culture that revealed Haemophilus influenza. Also reports right-sided sciatica with pain radiating from her posterior hip down to her knee. Overdue to recheck a sodium level.  At that time, my plan was: Given her history of hyponatremia will monitor her sodium level. She has a COPD exacerbation and possibly community-acquired pneumonia. Given her history of allergies to fluoroquinolones, I will treat the patient with Ceftin 500 mg by mouth twice a day for 10 days in addition to a Z-Pak to cover atypicals. I will also treat her for a COPD exacerbation with prednisone 40 mg a day for 7 days. Recheck in one week or sooner if worse. I believe the prednisone will likely help her sciatica  04/09/16 Patient is here today for follow-up. Her cough has resolved. Her breathing is better although she still reports dyspnea on exertion and shortness of breath with minimal activity. She continues to require oxygen. She is currently on Symbicort. She also continues to complain of right posterior hip pain that radiates into her medial thigh. This did not improve on prednisone  Past Medical History  Diagnosis Date  . COPD (chronic obstructive pulmonary disease) ([place])   . Anxiety disorder   . Hypothyroidism   . PUD (peptic ulcer disease)     41yr  . Dysphagia   . IBS (irritable bowel syndrome)   . Diverticulosis   . Trigeminal neuralgia   .  Diastolic dysfunction   . Allergic rhinitis   . [place]    left lung/surg only  . Hiatal hernia   . [place]ortic [place] ([place]   . Complication of anesthesia 2011    bloodpressure dropped during colonoscopy, not problems since  . Shortness of breath     with exertion  . 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[place]     corrected by surgery 08/21/13  . Compression fracture   . On home O2 12/15/2013    chronic hypoxia  . Depression   . [place] lung [place] 11/08/2014   Past Surgical History  Procedure Laterality Date  . 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 Esophagogastroduodenoscopy  11/13/2009    w/dilation to 11m gastric ulceration (H.Pylori) s/p treatment  . Esophagogastroduodenoscopy  11/21/2009    distal esophageal web, gastritis  . Lung cancer surgery  12/2010    Left VATS, minithoracotomy, LLL superior segmentectomy  . Vocal cord surgery  02/06/2011    laryngoscopy with bilateral vocal cord Radiesse injection for vocal cord paralysis  . Abdominal hysterectomy    . Cholecystectomy    . Tubal ligation    . Cataract extraction    . Fatty tumor removal from lt groin    . Cataract extraction w/phaco  09/02/2012    Procedure: CATARACT EXTRACTION PHACO AND INTRAOCULAR LENS PLACEMENT (IOC);  Surgeon: MaElta Guadeloupe. ShGershon CraneMD;  [facility];  Service: Ophthalmology;  Laterality: Left;  CDE:10.35  . Endarterectomy Right 08/21/2013  Procedure: RIGHT CAROTID ANEURYSM RESECTION;  Surgeon: Rosetta Posner, MD;  Location[facility] OR;  Service: Vascular;  Laterality: Right;  . Fracture surgery Left     hand and left ring finger  . Orif wrist fracture Left 04/09/2014    Procedure: OPEN REDUCTION INTERNAL FIXATION (ORIF) WRIST FRACTURE;  Surgeon: Renette Butters, MD;  Location: [place];  Service: Orthopedics;  Laterality: Left;  . Percutaneous pinning Left 04/09/2014    Procedure: PERCUTANEOUS PINNING EXTREMITY;  Surgeon: Renette Butters, MD;  Location:  [place];  Service: Orthopedics;  Laterality: Left;  . Esophagogastroduodenoscopy  03/14/12    [id number] in the distal esophagus/Mild gastritis/small HH. + H.pylori, prescribed Pylera. Finished treatment.   . Back surgery  January 13, 2014  . Colonoscopy N/A 11/23/2014    Procedure: COLONOSCOPY;  Surgeon: Danie Binder, MD;  [facility];  Service: Endoscopy;  Laterality: N/A;  930   . Yag laser application Left 25/95/6387    Procedure: YAG LASER APPLICATION;  Surgeon: Rutherford Guys, MD;  Location: [facility];  Service: Ophthalmology;  Laterality: Left;   Current Outpatient Prescriptions on File Prior to Visit  Medication Sig Dispense Refill  . albuterol (PROVENTIL HFA;VENTOLIN HFA) 108 (90 Base) MCG/ACT inhaler Inhale 2 puffs into the lungs every 6 (six) hours as needed for wheezing or shortness of breath. 1 Inhaler 6  . albuterol (PROVENTIL) (2.5 MG/3ML) 0.083% nebulizer solution Take 3 mLs (2.5 mg total) by nebulization every 6 (six) hours as needed for wheezing or shortness of breath. 75 mL 12  . ALPRAZolam (XANAX) 0.5 MG tablet Take 1 tablet (0.5 mg total) by mouth 2 (two) times daily as needed for anxiety or sleep. 60 tablet 1  . azithromycin (ZITHROMAX) 250 MG tablet 2 tabs poqday1, 1 tab poqday 2-5 6 tablet 0  . budesonide-formoterol (SYMBICORT) 160-4.5 MCG/ACT inhaler Inhale 2 puffs into the lungs 2 (two) times daily. 1 Inhaler 6  . calcium citrate-vitamin D (CITRACAL+D) 315-200 MG-UNIT per tablet Take 1 tablet by mouth daily.     . cefUROXime (CEFTIN) 500 MG tablet Take 1 tablet (500 mg total) by mouth 2 (two) times daily with a meal. 20 tablet 0  . citalopram (CELEXA) 40 MG tablet Take 1 tablet (40 mg total) by mouth at bedtime. 90 tablet 4  . cyclobenzaprine (FLEXERIL) 10 MG tablet Take 10 mg by mouth 3 (three) times daily as needed for muscle spasms.    . Flaxseed, Linseed, (FLAXSEED OIL) OIL Take 1 capsule by mouth daily.    Marland Kitchen levothyroxine (SYNTHROID, LEVOTHROID) 25 MCG tablet  Take 1 tablet (25 mcg total) by mouth daily before breakfast. 90 tablet 4  . omeprazole (PRILOSEC) 40 MG capsule Take 40 mg by mouth daily.     Marland Kitchen Spacer/Aero-Holding Chambers (AEROCHAMBER MV) inhaler Use as instructed 1 each 0   No current facility-administered medications on file prior to visit.   Allergies  Allergen Reactions  . Ciprofloxacin Hcl Hives  . Iohexol      Desc: Per [facility], pt is allergic to IV contrast, no type of reaction was available. Pt cannot remember but first reacted around 15 yrs ago from an IVP. Notes were date from 2009 at urology center., Onset Date: [id number]   . Prozac [Fluoxetine Hcl] Rash  . Sulfonamide Derivatives Hives and Rash   Social History   Social History  . Marital Status: Divorced    Spouse Name: N/A  . Number of Children: N/A  . Years of Education:  N/A   Occupational History  . Not on file.   Social History Main Topics  . Smoking status: Former Smoker -- 0.50 packs/day for 20 years    Types: Cigarettes    Quit date: 12/21/2010  . Smokeless tobacco: Never Used     Comment: smoking cessation info given and reviewed   . Alcohol Use: 0.0 oz/week    0 Standard drinks or equivalent per week     Comment: seldom  . Drug Use: No  . Sexual Activity: Yes    Birth Control/ Protection: Surgical   Other Topics Concern  . Not on file   Social History Narrative      Review of Systems  All other systems reviewed and are negative.      Objective:   Physical Exam  Neck: No JVD present.  Cardiovascular: Normal rate, regular rhythm and normal heart sounds.   Pulmonary/Chest: Effort normal. She has wheezes. She has rales.  Abdominal: Soft. Bowel sounds are normal.  Musculoskeletal: She exhibits no edema.  Lymphadenopathy:    She has no cervical adenopathy.  Vitals reviewed.         Assessment & Plan:  Right hip pain - Plan: DG HIP UNILAT WITH PELVIS 2-3 VIEWS RIGHT  Chronic obstructive pulmonary disease, unspecified  COPD type ([place])  Discontinue Symbicort and switch the patient to anoro one inhalation a day and recheck in 3 weeks to reassess her breathing on 2 long-acting bronchodilators. 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

## 2016-04-11 ENCOUNTER — Ambulatory Visit (HOSPITAL_COMMUNITY)
Admission: RE | Admit: 2016-04-11 | Discharge: 2016-04-11 | Disposition: A | Discharge status: Home / Self Care | Payer: PPO | Source: Ambulatory Visit | Attending: Family Medicine | Admitting: Family Medicine

## 2016-04-11 DIAGNOSIS — M1611 Unilateral primary osteoarthritis, right hip: Secondary | ICD-10-CM | POA: Insufficient documentation

## 2016-04-11 DIAGNOSIS — M25751 Osteophyte, right hip: Secondary | ICD-10-CM | POA: Insufficient documentation

## 2016-04-11 DIAGNOSIS — M25551 Pain in right hip: Secondary | ICD-10-CM | POA: Insufficient documentation

## 2016-04-13 ENCOUNTER — Encounter: Payer: Self-pay | Admitting: Family Medicine

## 2016-04-13 ENCOUNTER — Ambulatory Visit: Payer: PPO | Admitting: Family Medicine

## 2016-04-18 DIAGNOSIS — M25551 Pain in right hip: Secondary | ICD-10-CM | POA: Diagnosis not present

## 2016-04-18 DIAGNOSIS — M545 Low back pain: Secondary | ICD-10-CM | POA: Diagnosis not present

## 2016-05-01 ENCOUNTER — Ambulatory Visit (INDEPENDENT_AMBULATORY_CARE_PROVIDER_SITE_OTHER): Payer: PPO | Admitting: Family Medicine

## 2016-05-01 VITALS — BP 120/86 | HR 84 | Temp 98.4°F | Resp 20 | Ht 66.0 in | Wt 127.0 lb

## 2016-05-01 DIAGNOSIS — R0602 Shortness of breath: Secondary | ICD-10-CM | POA: Diagnosis not present

## 2016-05-01 DIAGNOSIS — J449 Chronic obstructive pulmonary disease, unspecified: Secondary | ICD-10-CM

## 2016-05-01 MED ORDER — UMECLIDINIUM-VILANTEROL 62.5-25 MCG/INH IN AEPB: 1.0000 | INHALATION_SPRAY | Freq: Every day | RESPIRATORY_TRACT | 5 refills | Status: DC

## 2016-05-01 NOTE — Progress Notes (Signed)
Subjective:    Patient ID: [name], female    DOB: 10/10/1940, 75 y.o.   MRN: [id number]  HPI 03/30/16 Patient reports worsening cough over the last 4 weeks. She's been treated for bronchitis with doxycycline, prednisone, and a Z-Pak without improvement. She reports increasing shortness of breath and wheezing. She is back on her oxygen. Her pulmonologist just started her on Symbicort. On examination today she has diminished breath sounds bilaterally with faint bibasilar crackles and rhonchorous breath sounds and some expiratory wheezing. In late May had a sputum culture that revealed Haemophilus influenza. Also reports right-sided sciatica with pain radiating from her posterior hip down to her knee. Overdue to recheck a sodium level.  At that time, my plan was: Given her history of hyponatremia will monitor her sodium level. She has a COPD exacerbation and possibly community-acquired pneumonia. Given her history of allergies to fluoroquinolones, I will treat the patient with Ceftin 500 mg by mouth twice a day for 10 days in addition to a Z-Pak to cover atypicals. I will also treat her for a COPD exacerbation with prednisone 40 mg a day for 7 days. Recheck in one week or sooner if worse. I believe the prednisone will likely help her sciatica  04/09/16 Patient is here today for follow-up. Her cough has resolved. Her breathing is better although she still reports dyspnea on exertion and shortness of breath with minimal activity. She continues to require oxygen. She is currently on Symbicort. She also continues to complain of right posterior hip pain that radiates into her medial thigh. This did not improve on prednisone.  At that time, my plan was: Discontinue Symbicort and switch the patient to anoro one inhalation a day and recheck in 3 weeks to reassess her breathing on 2 long-acting bronchodilators. Obtain an x-ray of the right hip as her pain now seems more consistent with degenerative joint  disease in the hip  05/01/16 The patient is doing much better on anoro.  Shortness of breath has improved. Her cough has improved. She denies any sputum production. She denies any fevers or chills or pleurisy. She denies any hemoptysis. Past Medical History:  Diagnosis Date  . Adenocarcinoma of lung ([place]) 12/2010   left lung/surg only  . Allergic rhinitis   . 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 Anxiety disorder   . Aortic aneurysm ([place])   . Complication of anesthesia 2011   bloodpressure dropped during colonoscopy, not problems since  . Compression fracture   . COPD (chronic obstructive pulmonary disease) ([place])   . Depression   . Diastolic dysfunction   . Diverticulosis   . Dysphagia   . Emphysema lung ([place]) 11/08/2014  . Hiatal hernia   . Hypothyroidism   . IBS (irritable bowel syndrome)   . On home O2 12/15/2013   chronic hypoxia  . PUD (peptic ulcer disease)    44yr  . Shortness of breath    with exertion  . Trigeminal neuralgia    Past Surgical History:  Procedure Laterality Date  . ABDOMINAL HYSTERECTOMY    . BACK SURGERY  January 13, 2014  . CATARACT EXTRACTION    . CATARACT EXTRACTION W/PHACO  09/02/2012   Procedure: CATARACT EXTRACTION PHACO AND INTRAOCULAR LENS PLACEMENT (IOC);  Surgeon: MElta GuadeloupeT. SGershon Crane MD;  [facility];  Service: Ophthalmology;  Laterality: Left;  CDE:10.35  . CHOLECYSTECTOMY    . COLONOSCOPY  2011   hyperplastic polyp, 3-4 small cecal  AVMs, nonbleeding  . COLONOSCOPY N/A 11/23/2014   Procedure: COLONOSCOPY;  Surgeon: Danie Binder, MD;  [facility];  Service: Endoscopy;  Laterality: N/A;  930   . ENDARTERECTOMY Right 08/21/2013   Procedure: RIGHT CAROTID ANEURYSM RESECTION;  Surgeon: Rosetta Posner, MD;  Location: [facility] OR;  Service: Vascular;  Laterality: Right;  . ESOPHAGOGASTRODUODENOSCOPY  11/13/2009   w/dilation to 56m, gastric ulceration (H.Pylori) s/p treatment  . ESOPHAGOGASTRODUODENOSCOPY   11/21/2009   distal esophageal web, gastritis  . ESOPHAGOGASTRODUODENOSCOPY  03/14/12   SOZH:[id number] the distal esophagus/Mild gastritis/small HH. + H.pylori, prescribed Pylera. Finished treatment.   . fatty tumor removal from lt groin    . FRACTURE SURGERY Left    hand and left ring finger  . LUNG CANCER SURGERY  12/2010   Left VATS, minithoracotomy, LLL superior segmentectomy  . ORIF WRIST FRACTURE Left 04/09/2014   Procedure: OPEN REDUCTION INTERNAL FIXATION (ORIF) WRIST FRACTURE;  Surgeon: TRenette Butters MD;  [place]  Service: Orthopedics;  Laterality: Left;  . PERCUTANEOUS PINNING Left 04/09/2014   Procedure: PERCUTANEOUS PINNING EXTREMITY;  Surgeon: TRenette Butters MD;  [place]  Service: Orthopedics;  Laterality: Left;  . TUBAL LIGATION    . vocal cord surgery  02/06/2011   laryngoscopy with bilateral vocal cord Radiesse injection for vocal cord paralysis  . YAG LASER APPLICATION Left 162/95/2841  Procedure: YAG LASER APPLICATION;  Surgeon: MRutherford Guys MD;  [facility];  Service: Ophthalmology;  Laterality: Left;   Current Outpatient Prescriptions on File Prior to Visit  Medication Sig Dispense Refill  . albuterol (PROVENTIL HFA;VENTOLIN HFA) 108 (90 Base) MCG/ACT inhaler Inhale 2 puffs into the lungs every 6 (six) hours as needed for wheezing or shortness of breath. 1 Inhaler 6  . albuterol (PROVENTIL) (2.5 MG/3ML) 0.083% nebulizer solution Take 3 mLs (2.5 mg total) by nebulization every 6 (six) hours as needed for wheezing or shortness of breath. 75 mL 12  . ALPRAZolam (XANAX) 0.5 MG tablet Take 1 tablet (0.5 mg total) by mouth 2 (two) times daily as needed for anxiety or sleep. 60 tablet 1  . calcium citrate-vitamin D (CITRACAL+D) 315-200 MG-UNIT per tablet Take 1 tablet by mouth daily.     . citalopram (CELEXA) 40 MG tablet Take 1 tablet (40 mg total) by mouth at bedtime. 90 tablet 4  . cyclobenzaprine (FLEXERIL) 10 MG tablet Take 10 mg by mouth 3 (three)  times daily as needed for muscle spasms.    . Flaxseed, Linseed, (FLAXSEED OIL) OIL Take 1 capsule by mouth daily.    .Marland Kitchenlevothyroxine (SYNTHROID, LEVOTHROID) 25 MCG tablet Take 1 tablet (25 mcg total) by mouth daily before breakfast. 90 tablet 4  . omeprazole (PRILOSEC) 40 MG capsule Take 40 mg by mouth daily.     .Marland KitchenSpacer/Aero-Holding Chambers (AEROCHAMBER MV) inhaler Use as instructed 1 each 0   No current facility-administered medications on file prior to visit.    Allergies  Allergen Reactions  . Ciprofloxacin Hcl Hives  . Iohexol      Desc: Per [facility], pt is allergic to IV contrast, no type of reaction was available. Pt cannot remember but first reacted around 15 yrs ago from an IVP. Notes were date from 2009 at urology center., Onset Date: [id number]  . Prozac [Fluoxetine Hcl] Rash  . Sulfonamide Derivatives Hives and Rash   Social History   Social History  . Marital status: Divorced    Spouse name: N/A  .  Number of children: N/A  . Years of education: N/A   Occupational History  . Not on file.   Social History Main Topics  . Smoking status: Former Smoker    Packs/day: 0.50    Years: 20.00    Types: Cigarettes    Quit date: 12/21/2010  . Smokeless tobacco: Never Used     Comment: smoking cessation info given and reviewed   . Alcohol use 0.0 oz/week     Comment: seldom  . Drug use: No  . Sexual activity: Yes    Birth control/ protection: Surgical   Other Topics Concern  . Not on file   Social History Narrative  . No narrative on file      Review of Systems  All other systems reviewed and are negative.      Objective:   Physical Exam  Neck: No JVD present.  Cardiovascular: Normal rate, regular rhythm and normal heart sounds.   Pulmonary/Chest: Effort normal. She has no wheezes. She has no rales.  Abdominal: Soft. Bowel sounds are normal.  Musculoskeletal: She exhibits no edema.  Lymphadenopathy:    She has no cervical adenopathy.  Vitals  reviewed.         Assessment & Plan:  Chronic obstructive pulmonary disease, unspecified COPD type ([place])  Continue anoro inh qday for COPD.

## 2016-05-02 ENCOUNTER — Telehealth: Payer: Self-pay | Admitting: Family Medicine

## 2016-05-02 DIAGNOSIS — M545 Low back pain: Secondary | ICD-10-CM | POA: Diagnosis not present

## 2016-05-02 DIAGNOSIS — M25551 Pain in right hip: Secondary | ICD-10-CM | POA: Diagnosis not present

## 2016-05-02 NOTE — Telephone Encounter (Signed)
Patient called to let [name] know that [name] did Hip injection.   CB# [phone]

## 2016-05-03 ENCOUNTER — Telehealth: Payer: Self-pay | Admitting: *Deleted

## 2016-05-03 NOTE — Telephone Encounter (Signed)
Received request from pharmacy for PA on Anoro.   Patient has tried and failed: Symbicort Stiolto Atrovent Albuterol  PA submitted.   Dx: J44.9- COPD.

## 2016-05-04 ENCOUNTER — Encounter: Payer: Self-pay | Admitting: Family Medicine

## 2016-05-04 DIAGNOSIS — C3490 Malignant neoplasm of unspecified part of unspecified bronchus or lung: Secondary | ICD-10-CM | POA: Diagnosis not present

## 2016-05-04 DIAGNOSIS — J9621 Acute and chronic respiratory failure with hypoxia: Secondary | ICD-10-CM | POA: Diagnosis not present

## 2016-05-04 DIAGNOSIS — R0602 Shortness of breath: Secondary | ICD-10-CM | POA: Diagnosis not present

## 2016-05-04 DIAGNOSIS — R0902 Hypoxemia: Secondary | ICD-10-CM | POA: Diagnosis not present

## 2016-05-08 ENCOUNTER — Other Ambulatory Visit: Payer: Self-pay | Admitting: Family Medicine

## 2016-05-08 MED ORDER — UMECLIDINIUM-VILANTEROL 62.5-25 MCG/INH IN AEPB: 1.0000 | INHALATION_SPRAY | Freq: Every day | RESPIRATORY_TRACT | 3 refills | Status: DC

## 2016-05-09 NOTE — Telephone Encounter (Signed)
PA Approved from 05/04/16-10/07/16  Pharmacy aware

## 2016-05-16 ENCOUNTER — Encounter: Payer: Self-pay | Admitting: Family Medicine

## 2016-05-17 MED ORDER — ALPRAZOLAM 0.5 MG PO TABS: 0.5000 mg | ORAL_TABLET | Freq: Two times a day (BID) | ORAL | 1 refills | Status: DC | PRN

## 2016-05-17 MED ORDER — HYOSCYAMINE SULFATE 0.125 MG PO TABS: 0.1250 mg | ORAL_TABLET | Freq: Four times a day (QID) | ORAL | 1 refills | Status: DC | PRN

## 2016-05-17 MED ORDER — CYCLOBENZAPRINE HCL 10 MG PO TABS: 10.0000 mg | ORAL_TABLET | Freq: Three times a day (TID) | ORAL | 0 refills | Status: DC | PRN

## 2016-05-24 ENCOUNTER — Telehealth: Payer: Self-pay | Admitting: Family Medicine

## 2016-05-24 MED ORDER — HYOSCYAMINE SULFATE 0.125 MG PO TABS: 0.1250 mg | ORAL_TABLET | Freq: Four times a day (QID) | ORAL | 3 refills | Status: DC | PRN

## 2016-05-24 MED ORDER — CYCLOBENZAPRINE HCL 10 MG PO TABS: 10.0000 mg | ORAL_TABLET | Freq: Three times a day (TID) | ORAL | 1 refills | Status: DC | PRN

## 2016-05-24 NOTE — Telephone Encounter (Signed)
meds sent to pharm for 90 day supply

## 2016-05-24 NOTE — Telephone Encounter (Signed)
Ok with 90 day supply

## 2016-05-24 NOTE — Telephone Encounter (Signed)
Pt mail order pharmacy calling to clarify mail order prescriptions for Cyclobenzaprine and Hyoscyamine.  Need more clarified orders and quality to be dispensed for 90 days.  Please call Lattie Haw [phone].

## 2016-05-24 NOTE — Telephone Encounter (Signed)
OK to do these medications for 90 day supply or should this be for 90 day supply

## 2016-05-28 ENCOUNTER — Telehealth (HOSPITAL_COMMUNITY): Payer: Self-pay | Admitting: Oncology

## 2016-05-28 ENCOUNTER — Ambulatory Visit (HOSPITAL_COMMUNITY)
Admission: RE | Admit: 2016-05-28 | Discharge: 2016-05-28 | Disposition: A | Discharge status: Home / Self Care | Payer: PPO | Source: Ambulatory Visit | Attending: Hematology & Oncology | Admitting: Hematology & Oncology

## 2016-05-28 DIAGNOSIS — C3492 Malignant neoplasm of unspecified part of left bronchus or lung: Secondary | ICD-10-CM

## 2016-05-28 NOTE — Telephone Encounter (Signed)
Pt called today upset because Radiology would not perform  a scheduled CT CHEST because the "order was not E signed". I spk with Gershon Mussel K and was advised to reschedule appt,offer RCATS and to let her know that we will get  Harless Nakayama involved because she should have never been turned away for an issue like this.  Spk with pt and she was understanding. Rescheduled appt to 8/28 @ 6pm and she will have her grandchild take her and if that does not work out she will call back to take [place] up on our offer of RCATS.

## 2016-06-04 ENCOUNTER — Ambulatory Visit (HOSPITAL_COMMUNITY)
Admission: RE | Admit: 2016-06-04 | Discharge: 2016-06-04 | Disposition: A | Discharge status: Home / Self Care | Payer: PPO | Source: Ambulatory Visit | Attending: Hematology & Oncology | Admitting: Hematology & Oncology

## 2016-06-04 ENCOUNTER — Encounter: Payer: Self-pay | Admitting: Family Medicine

## 2016-06-04 DIAGNOSIS — K449 Diaphragmatic hernia without obstruction or gangrene: Secondary | ICD-10-CM | POA: Insufficient documentation

## 2016-06-04 DIAGNOSIS — J479 Bronchiectasis, uncomplicated: Secondary | ICD-10-CM | POA: Insufficient documentation

## 2016-06-04 DIAGNOSIS — C3492 Malignant neoplasm of unspecified part of left bronchus or lung: Secondary | ICD-10-CM | POA: Insufficient documentation

## 2016-06-04 DIAGNOSIS — R0602 Shortness of breath: Secondary | ICD-10-CM | POA: Diagnosis not present

## 2016-06-04 DIAGNOSIS — R0902 Hypoxemia: Secondary | ICD-10-CM | POA: Diagnosis not present

## 2016-06-04 DIAGNOSIS — X58XXXA Exposure to other specified factors, initial encounter: Secondary | ICD-10-CM | POA: Insufficient documentation

## 2016-06-04 DIAGNOSIS — M4856XA Collapsed vertebra, not elsewhere classified, lumbar region, initial encounter for fracture: Secondary | ICD-10-CM | POA: Diagnosis not present

## 2016-06-04 DIAGNOSIS — M4854XA Collapsed vertebra, not elsewhere classified, thoracic region, initial encounter for fracture: Secondary | ICD-10-CM | POA: Insufficient documentation

## 2016-06-04 DIAGNOSIS — I2510 Atherosclerotic heart disease of native coronary artery without angina pectoris: Secondary | ICD-10-CM | POA: Insufficient documentation

## 2016-06-04 DIAGNOSIS — I700 Atherosclerosis of aorta: Secondary | ICD-10-CM | POA: Diagnosis not present

## 2016-06-04 DIAGNOSIS — J9621 Acute and chronic respiratory failure with hypoxia: Secondary | ICD-10-CM | POA: Diagnosis not present

## 2016-06-04 DIAGNOSIS — J432 Centrilobular emphysema: Secondary | ICD-10-CM | POA: Diagnosis not present

## 2016-06-04 DIAGNOSIS — C3490 Malignant neoplasm of unspecified part of unspecified bronchus or lung: Secondary | ICD-10-CM | POA: Diagnosis not present

## 2016-06-04 IMAGING — RF DG ESOPHAGUS
19 of 24 series · 19 of 24 positions shown · non-contrast
Comparison: None

CLINICAL DATA: Dysphagia for solids at the level of the distal
esophagus, history of hiatal hernia and prior throat stretching,
personal history of COPD, peptic ulcer disease, irritable bowel
syndrome, former smoker

EXAM:
ESOPHOGRAM / BARIUM SWALLOW / BARIUM TABLET STUDY
TECHNIQUE: Combined double contrast and single contrast examination performed
using effervescent crystals, thick barium liquid, and thin barium
liquid. The patient was observed with fluoroscopy swallowing a 13mm
barium sulphate tablet.
FLUOROSCOPY TIME:  1 min 42 seconds

[Series 1: run · 1 of 1 slices shown (1 of 19)]
[im 1/1]
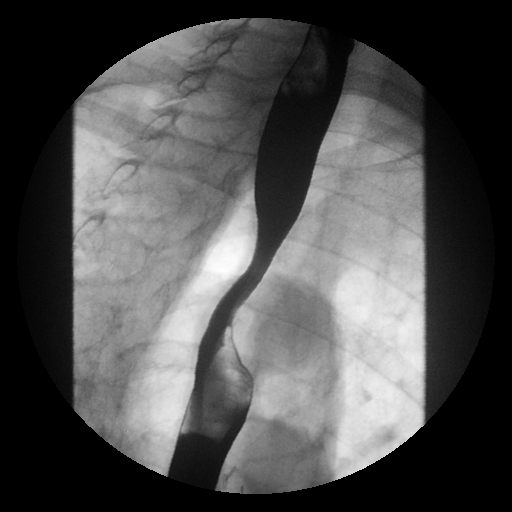

[Series 2: run · 1 of 1 slices shown (2 of 19)]
[im 1/1]
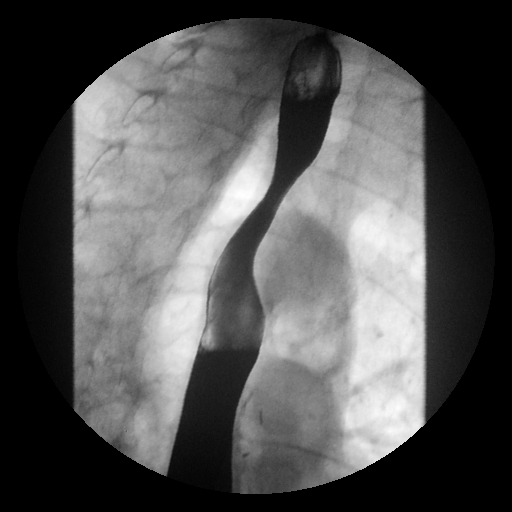

[Series 4: run · 1 of 1 slices shown (3 of 19)]
[im 1/1]
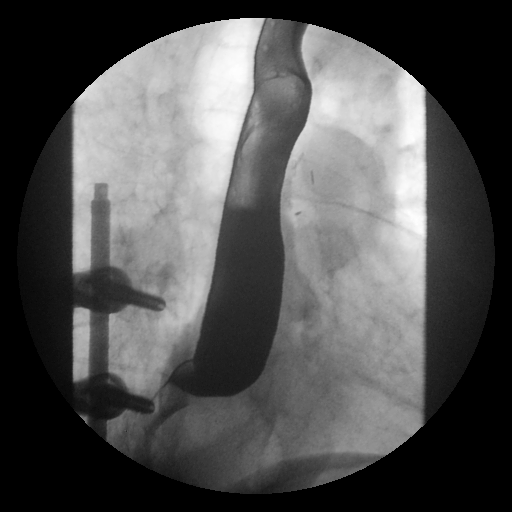

[Series 5: run · 1 of 1 slices shown (4 of 19)]
[im 1/1]
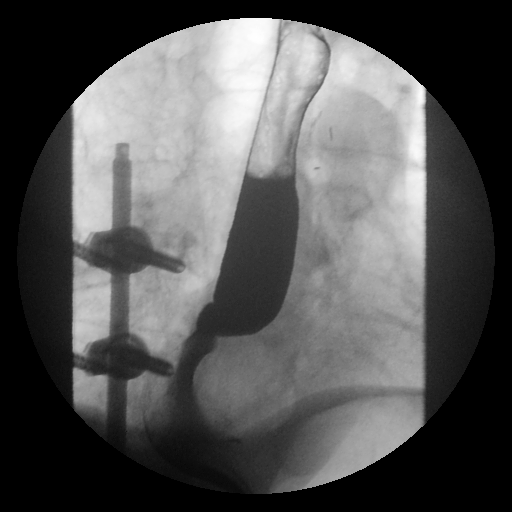

[Series 6: run · 1 of 1 slices shown (5 of 19)]
[im 1/1]
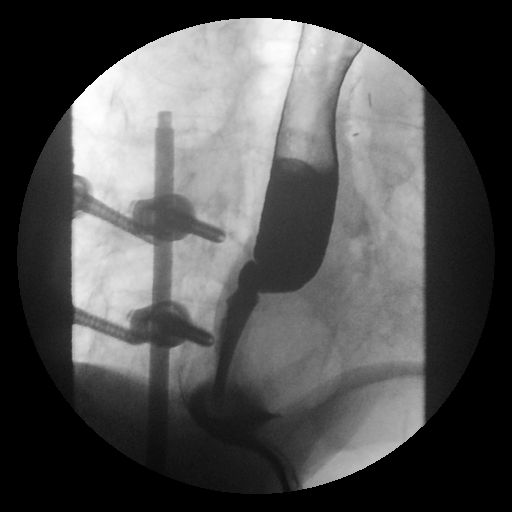

[Series 7: run · 1 of 1 slices shown (6 of 19)]
[im 1/1]
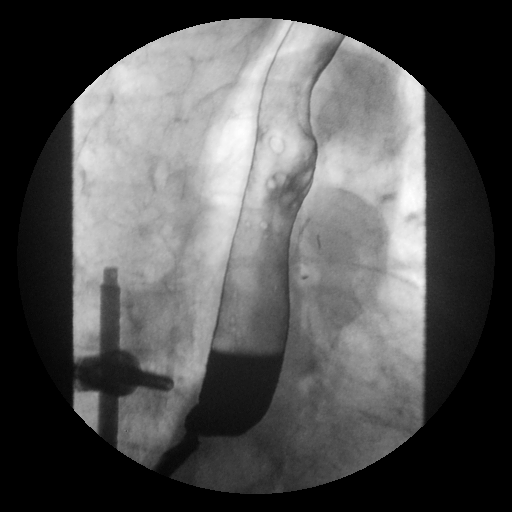

[Series 9: run · 1 of 1 slices shown (7 of 19)]
[im 1/1]
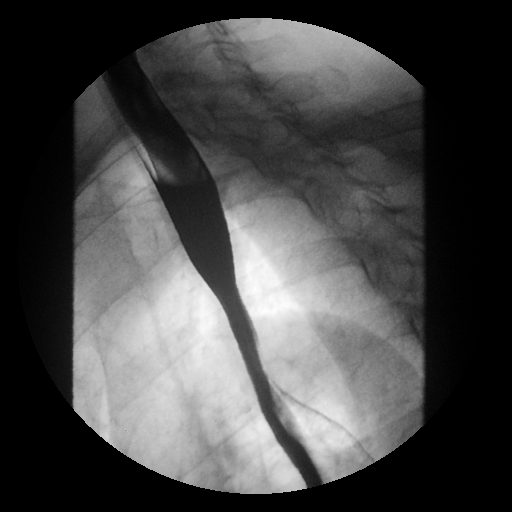

[Series 10: run · 1 of 1 slices shown (8 of 19)]
[im 1/1]
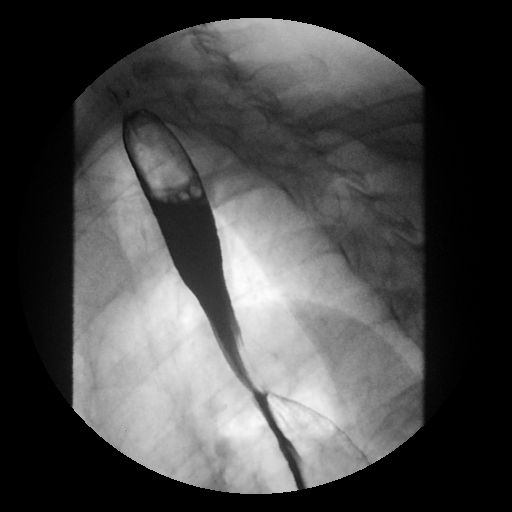

[Series 11: run · 1 of 1 slices shown (9 of 19)]
[im 1/1]
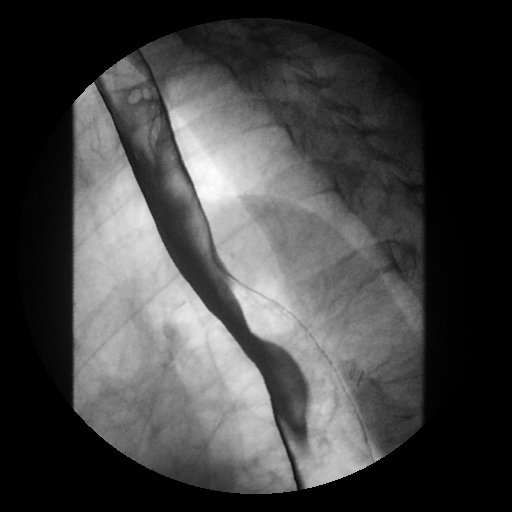

[Series 13: run · 1 of 1 slices shown (10 of 19)]
[im 1/1]
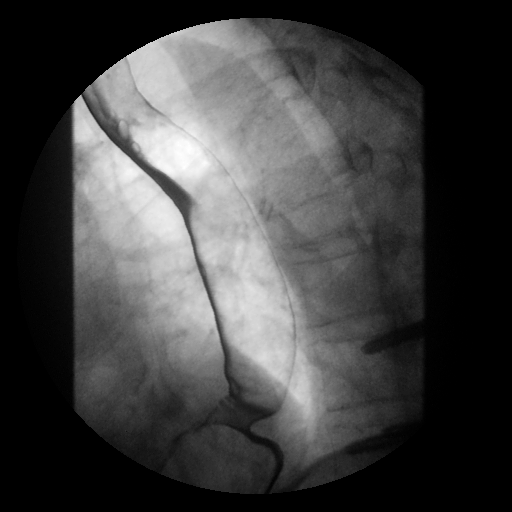

[Series 14: run · 1 of 1 slices shown (11 of 19)]
[im 1/1]
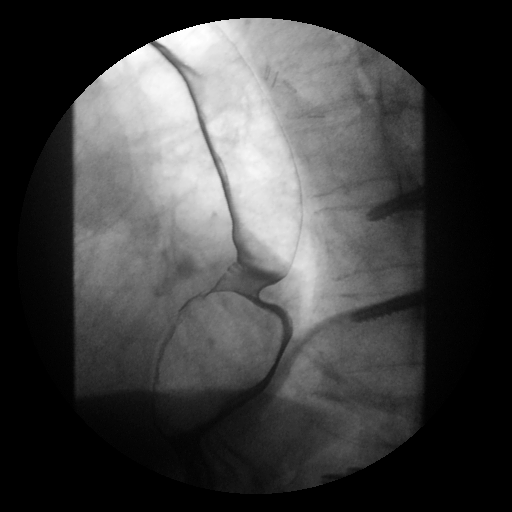

[Series 15: run · 1 of 1 slices shown (12 of 19)]
[im 1/1]
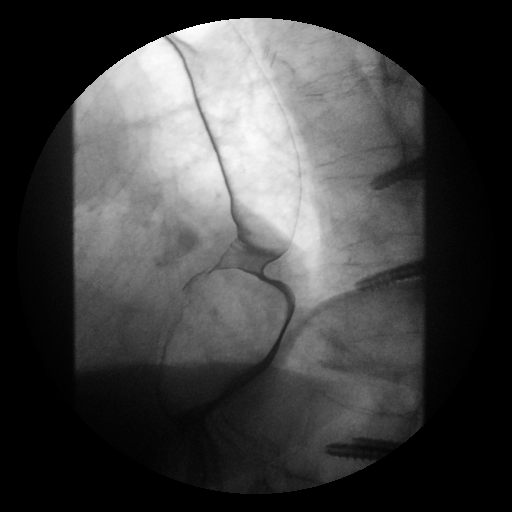

[Series 16: run · 1 of 1 slices shown (13 of 19)]
[im 1/1]
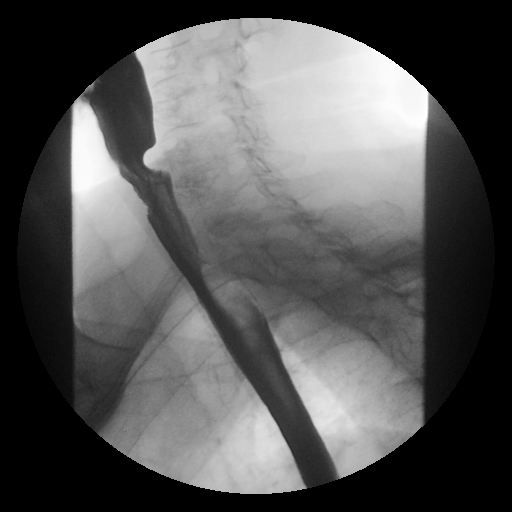

[Series 18: run · 1 of 1 slices shown (14 of 19)]
[im 1/1]
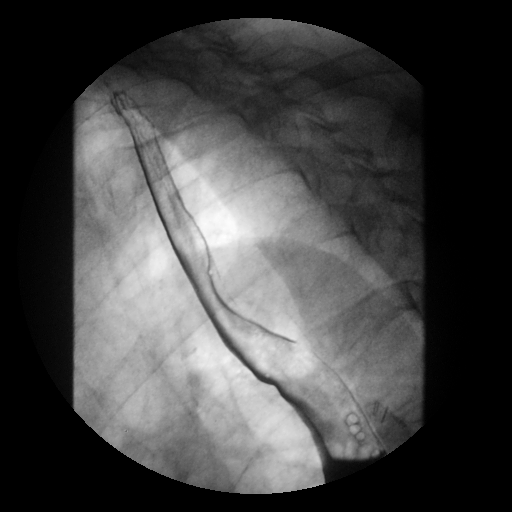

[Series 19: run · 1 of 1 slices shown (15 of 19)]
[im 1/1]
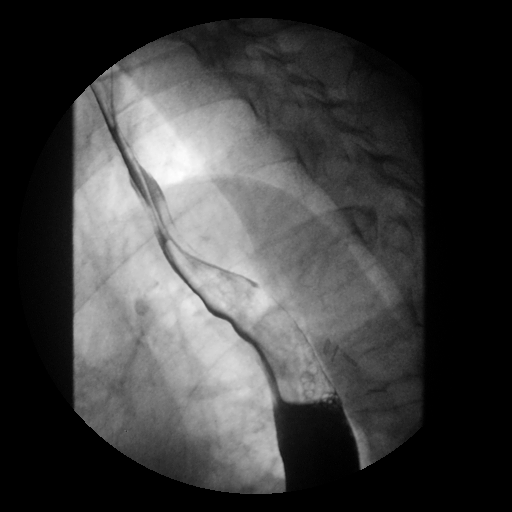

[Series 20: run · 1 of 1 slices shown (16 of 19)]
[im 1/1]
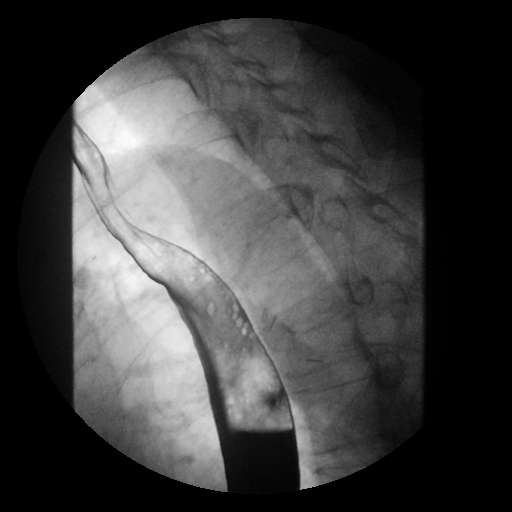

[Series 21: run · 1 of 1 slices shown (17 of 19)]
[im 1/1]
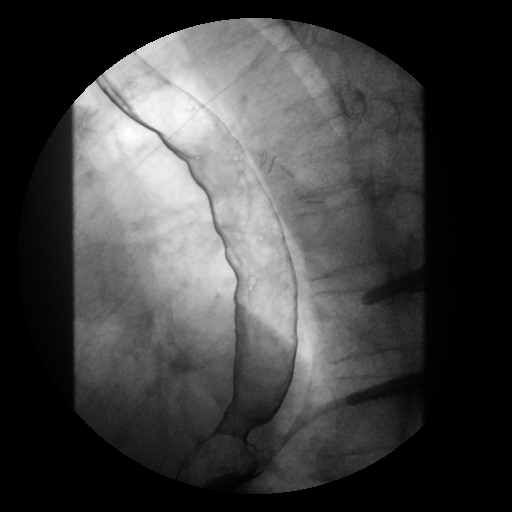

[Series 23: run · 1 of 1 slices shown (18 of 19)]
[im 1/1]
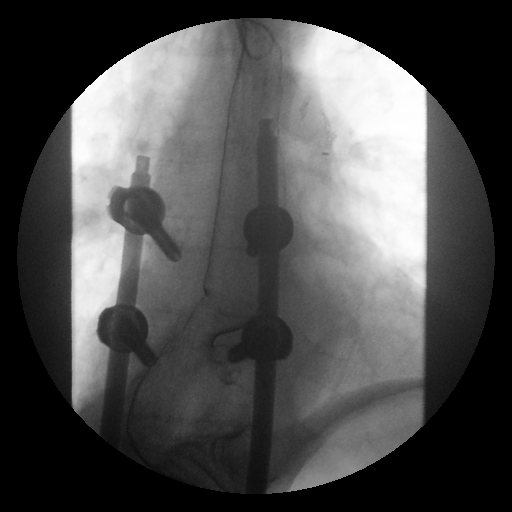

[Series 24: run · 1 of 1 slices shown (19 of 19)]
[im 1/1]
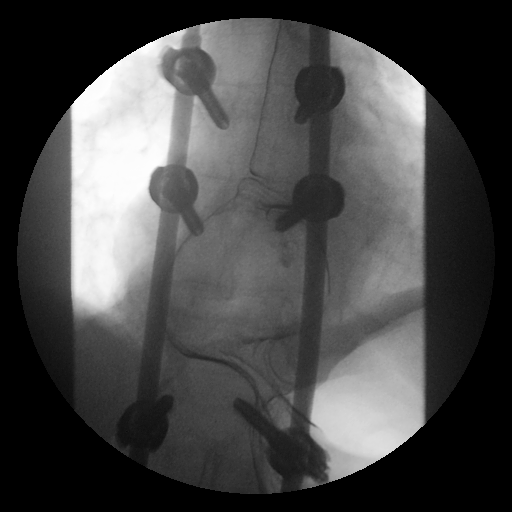

[19 of 24 positions shown; findings below may reference images not displayed]

FINDINGS: Normal esophageal distention.

No esophageal mass or stricture.

12.5 mm diameter barium tablet passes from oral cavity to stomach
without delay.

Indentation of the esophagus by the aortic arch though this is
nonobstructive.

Moderate-sized hiatal hernia.

Minimal age-related esophageal dysmotility.

Smooth appearance of esophageal mucosa on air contrast imaging
without irregularity or ulceration.

Targeted rapid sequence imaging of the cervical esophagus and
hypopharynx showed laryngeal penetration without aspiration.

Bones appear demineralized.

Atherosclerotic changes of the thoracic aorta.
IMPRESSION: No evidence of esophageal mass or obstruction.

Moderate-sized hiatal hernia.

Laryngeal penetration without aspiration.

## 2016-06-04 MED ORDER — LEVOTHYROXINE SODIUM 25 MCG PO TABS: 25.0000 ug | ORAL_TABLET | Freq: Every day | ORAL | 4 refills | Status: DC

## 2016-06-04 MED ORDER — DICYCLOMINE HCL 20 MG PO TABS: 20.0000 mg | ORAL_TABLET | Freq: Three times a day (TID) | ORAL | 1 refills | Status: DC | PRN

## 2016-06-05 ENCOUNTER — Other Ambulatory Visit (HOSPITAL_COMMUNITY): Payer: Self-pay | Admitting: Hematology & Oncology

## 2016-06-05 ENCOUNTER — Encounter (HOSPITAL_COMMUNITY): Payer: PPO | Attending: Hematology & Oncology | Admitting: Hematology & Oncology

## 2016-06-05 ENCOUNTER — Encounter: Payer: Self-pay | Admitting: Family Medicine

## 2016-06-05 ENCOUNTER — Encounter (HOSPITAL_COMMUNITY): Payer: Self-pay | Admitting: Hematology & Oncology

## 2016-06-05 VITALS — BP 133/70 | HR 77 | Temp 97.9°F | Resp 18 | Wt 128.4 lb

## 2016-06-05 DIAGNOSIS — Z1239 Encounter for other screening for malignant neoplasm of breast: Secondary | ICD-10-CM

## 2016-06-05 DIAGNOSIS — S32010A Wedge compression fracture of first lumbar vertebra, initial encounter for closed fracture: Secondary | ICD-10-CM

## 2016-06-05 DIAGNOSIS — Z85118 Personal history of other malignant neoplasm of bronchus and lung: Secondary | ICD-10-CM | POA: Diagnosis not present

## 2016-06-05 DIAGNOSIS — J201 Acute bronchitis due to Hemophilus influenzae: Secondary | ICD-10-CM

## 2016-06-05 DIAGNOSIS — Z1231 Encounter for screening mammogram for malignant neoplasm of breast: Secondary | ICD-10-CM

## 2016-06-05 MED ORDER — AZITHROMYCIN 250 MG PO TABS: ORAL_TABLET | ORAL | 0 refills | Status: DC

## 2016-06-05 NOTE — Progress Notes (Signed)
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 0/14 lymph nodes.  No perineural invasion or LVI.  negative margins.      06/05/2016 Imaging    Although the previously described ground-glass attenuation nodule in the superior segment of the right lower lobe measures slightly larger on today's examination than prior studies, this is favored to be technique related. Today's study should serve as a baseline for future followup examinations. At this time, there is no central solid component to this lesion. No new nodules are noted. 2. Stable postoperative scarring in the left lower lobe and post infectious scarring throughout the visualize lung bases, as above. 3. Areas of cylindrical bronchiectasis throughout the lung bases bilaterally        CURRENT THERAPY: Surveillance per NCCN guidelines  INTERVAL HISTORY: Katrinna S Micucci 75 y.o. female returns for followup of Stage IB bronchoalveolar carcinoma,. Well-differentiated the left lower lobe status post resection on 12/21/2010. She had 14 lymph nodes negative and no perineural space invasion with clear margins and no LV I. Thus far she has no evidence for recurrent disease or new disease.    Oncologically, she denies any complaints and ROS questioning is negative.   Ms. Mikal Plane is here alone and ambulates with the assistance of a walker. I personally reviewed and discussed imaging studies with the patient.  Patient is unaccompanied. Patients oxygen saturation today was noted to be 88%. She is not wearing her O2.   Her CT scans look good other than fracture at L1. She mentioned that she is experiencing low back pain. She states that she has been sleeping on the couch to help with her pain.  However, when she sleeps on her left side she often falls off the couch.  She states that she is feeling congested. She also notes a cough She mentioned that she saw another doctor who diagnosed her with haemophilus influenza and she was treated with z-pak. She felt better initially but it is starting back up. She is currently taking Benadryl.  She has no other concerns today. She will be due for screening mammogram again in October.   Past Medical History:  Diagnosis Date  . 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 Allergic rhinitis   . 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 Anxiety disorder   . Aortic aneurysm ([facility])   . Complication of anesthesia 2011   bloodpressure dropped during colonoscopy, not problems since  . Compression fracture   . COPD (chronic obstructive pulmonary disease) ([facility])   . Depression   . Diastolic dysfunction   . Diverticulosis   . Dysphagia   . Emphysema lung ([facility]) 11/08/2014  . Hiatal hernia   . Hypothyroidism   . IBS (irritable bowel syndrome)   . On home O2 12/15/2013   chronic hypoxia  . PUD (peptic ulcer disease)    38yr  . Shortness of breath    with exertion  . Trigeminal neuralgia     has CONTRAST DYE ALLERGY; Epigastric pain; Esophageal dysphagia; Trigeminal neuralgia; Adenocarcinoma of lung ([facility]; Dyspepsia; Aneurysm ([facility]; Abdominal aneurysm without mention of rupture; 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[facility]; Hypoxia; Diastolic dysfunction; Compression fracture; Constipation; Acute respiratory  failure with hypoxia ([place]); Hyponatremia; COPD (chronic obstructive pulmonary disease) ([place]); Compression fracture of thoracic vertebra ([place]); T12 burst fracture ([place]); Occlusion and stenosis of carotid artery without mention of cerebral infarction; Wrist fracture; Acute on chronic respiratory failure with hypoxia ([place]); Helicobacter pylori gastritis; Emphysema lung ([place]); Change in bowel habits; Abdominal pain, left lower  quadrant; HLD (hyperlipidemia); Exertional dyspnea; Hypersomnia; and Acute bronchitis on her problem list.     is allergic to ciprofloxacin hcl; iohexol; prozac [fluoxetine hcl]; and sulfonamide derivatives.  Ms. Mcmillen does not currently have medications on file.  Past Surgical History:  Procedure Laterality Date  . ABDOMINAL HYSTERECTOMY    . BACK SURGERY  January 13, 2014  . CATARACT EXTRACTION    . CATARACT EXTRACTION W/PHACO  09/02/2012   Procedure: CATARACT EXTRACTION PHACO AND INTRAOCULAR LENS PLACEMENT (IOC);  Surgeon: Elta Guadeloupe T. Gershon Crane, MD;  Location: [facility];  Service: Ophthalmology;  Laterality: Left;  CDE:10.35  . CHOLECYSTECTOMY    . 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 COLONOSCOPY N/A 11/23/2014   Procedure: COLONOSCOPY;  Surgeon: Danie Binder, MD;  Location: [facility];  Service: Endoscopy;  Laterality: N/A;  930   . ENDARTERECTOMY Right 08/21/2013   Procedure: RIGHT CAROTID ANEURYSM RESECTION;  Surgeon: Rosetta Posner, MD;  [facility];  Service: Vascular;  Laterality: Right;  . ESOPHAGOGASTRODUODENOSCOPY  11/13/2009   w/dilation to 46m, gastric ulceration (H.Pylori) s/p treatment  . ESOPHAGOGASTRODUODENOSCOPY  11/21/2009   distal esophageal web, gastritis  . ESOPHAGOGASTRODUODENOSCOPY  03/14/12   SNWG:NFAOZHYQMin the distal esophagus/Mild gastritis/small HH. + H.pylori, prescribed Pylera. Finished treatment.   . fatty tumor removal from lt groin    . FRACTURE SURGERY Left    hand and left ring finger  . LUNG CANCER SURGERY  12/2010   Left VATS, minithoracotomy, LLL superior segmentectomy  . ORIF WRIST FRACTURE Left 04/09/2014   Procedure: OPEN REDUCTION INTERNAL FIXATION (ORIF) WRIST FRACTURE;  Surgeon: TRenette Butters MD;  Location: [facility]  Service: Orthopedics;  Laterality: Left;  . PERCUTANEOUS PINNING Left 04/09/2014   Procedure: PERCUTANEOUS PINNING EXTREMITY;  Surgeon: TRenette Butters MD;  Location: [facility]  Service:  Orthopedics;  Laterality: Left;  . TUBAL LIGATION    . vocal cord surgery  02/06/2011   laryngoscopy with bilateral vocal cord Radiesse injection for vocal cord paralysis  . YAG LASER APPLICATION Left [id number]  Procedure: YAG LASER APPLICATION;  Surgeon: MRutherford Guys MD;  Location: [facility];  Service: Ophthalmology;  Laterality: Left;    Denies any headaches, dizziness, double vision, fevers, chills, night sweats, nausea, vomiting, diarrhea, constipation, chest pain, heart palpitations, shortness of breath, blood in stool, black tarry stool, urinary pain, urinary burning, urinary frequency, hematuria.  Positive for congestion. Positive for joint pain.  14 point review of systems was performed and is negative except as detailed under history of present illness and above     PHYSICAL EXAMINATION  ECOG PERFORMANCE STATUS: 1 - Symptomatic but completely ambulatory  Vitals:   06/05/16 1142  BP: 133/70  Pulse: 77  Resp: 18  Temp: 97.9 F (36.6 C)   GENERAL:alert, no distress, well nourished, well developed, comfortable, cooperative and smiling, ambulates with walker, able to get on exam table with assistance.  SKIN: skin color, texture, turgor are normal, no rashes or significant lesions HEAD: Normocephalic, No masses, lesions, tenderness or abnormalities EYES: normal, PERRLA, EOMI, Conjunctiva are pink and non-injected EARS: External ears normal OROPHARYNX:lips, buccal  mucosa, and tongue normal and mucous membranes are moist  NECK: supple, thyroid normal size, non-tender, without nodularity, no stridor, non-tender, trachea midline LYMPH:  No palpable adenopathy in the neck or supraclavicular regions LUNGS: clear to auscultation with occasional rhonchi that clear with cough HEART: regular rate & rhythm, no murmurs and no gallops ABDOMEN:abdomen soft and normal bowel sounds BACK: Back symmetric, no curvature. EXTREMITIES:less then 2 second capillary refill, no joint deformities,  effusion, or inflammation, no cyanosis  NEURO: alert & oriented x 3 with fluent speech, no focal motor/sensory deficits, gait normal with a rolling walker for stability.   LABORATORY DATA: I have reviewed the data as listed. CBC    Component Value Date/Time   WBC 7.7 03/30/2016 0831   RBC 4.74 03/30/2016 0831   HGB 11.7 (L) 03/30/2016 0831   HCT 38.3 03/30/2016 0831   PLT 508 (H) 03/30/2016 0831   MCV 80.8 03/30/2016 0831   MCH 24.7 (L) 03/30/2016 0831   MCHC 30.5 (L) 03/30/2016 0831   RDW 15.8 (H) 03/30/2016 0831   LYMPHSABS 1,771 03/30/2016 0831   MONOABS 770 03/30/2016 0831   EOSABS 616 (H) 03/30/2016 0831   BASOSABS 77 03/30/2016 0831      Chemistry      Component Value Date/Time   NA 137 03/30/2016 0831   K 4.9 03/30/2016 0831   CL 95 (L) 03/30/2016 0831   CO2 27 03/30/2016 0831   BUN 6 (L) 03/30/2016 0831   CREATININE 0.54 (L) 03/30/2016 0831      Component Value Date/Time   CALCIUM 9.7 03/30/2016 0831   ALKPHOS 86 03/30/2016 0831   AST 22 03/30/2016 0831   ALT 17 03/30/2016 0831   BILITOT 0.4 03/30/2016 0831       RADIOGRAPHIC STUDIES: I have personally reviewed the radiological images as listed and agreed with the findings in the report.  Study Result   CLINICAL DATA:  75 year old female with history of pulmonary nodules. History of lung cancer status post wedge resection in 2012. Followup study. Former smoker. Chronic shortness of breath related to COPD.  EXAM: CT CHEST WITHOUT CONTRAST  TECHNIQUE: Multidetector CT imaging of the chest was performed following the standard protocol without IV contrast.  COMPARISON:  Multiple priors, most recently chest CT 05/09/2015.  FINDINGS: Cardiovascular: Heart size is normal. Small amount of pericardial fluid and/or thickening, unlikely to be of any hemodynamic significance at this time. No associated pericardial calcification. There is aortic atherosclerosis, as well as atherosclerosis of  the great vessels of the mediastinum and the coronary arteries, including calcified atherosclerotic plaque in the left main and left circumflex coronary arteries.  Mediastinum/Nodes: No pathologically enlarged mediastinal or hilar lymph nodes. Please note that accurate exclusion of hilar adenopathy is limited on noncontrast CT scans. Small hiatal hernia. No axillary lymphadenopathy.  Lungs/Pleura: Postoperative changes of superior segmentectomy in the left lower lobe are again noted. There is some postoperative scarring and architectural distortion in the adjacent basilar segments of the left lower lobe which appears similar to prior examinations. Diffuse bronchial wall thickening with moderate to severe centrilobular emphysema. Additional areas of more profound thickening of the peribronchovascular interstitium are noted, predominantly throughout the lung bases. Some areas of architectural distortion are noted throughout the lung bases, and associated mission with areas of very mild cylindrical bronchiectasis, likely to reflect areas of chronic post infectious/inflammatory scarring. The nodule of concern on prior examinations in the superior segment of the right lower lobe appears slightly larger on today's study, currently  measuring 8 x 6 mm (mean diameter of 7 mm), however, this may simply be technique related, as today's images were reconstructed at 2 mm thickness (versus 5 mm thickness on the prior study). No other definite suspicious appearing pulmonary nodules or masses are noted. No acute consolidative airspace disease. No pleural effusions.  Upper Abdomen: Visualized portions are unremarkable.  Musculoskeletal: Chronic compression fractures are noted at T8 and T12, most severe at T12 where there is complete loss of central vertebral body height and approximately 8 mm of retropulsion of fracture fragments, which is similar to the prior examination. New compression  fracture at L1 with approximately 50% loss of central vertebral body height. Orthopedic rod and screw fixation devices in the thoracolumbar spine starting at T10, and extending below the level of imaging at L2. There are no aggressive appearing lytic or blastic lesions noted in the visualized portions of the skeleton.  IMPRESSION: 1. Although the previously described ground-glass attenuation nodule in the superior segment of the right lower lobe measures slightly larger on today's examination than prior studies, this is favored to be technique related. Today's study should serve as a baseline for future followup examinations. At this time, there is no central solid component to this lesion. No new nodules are noted. 2. Stable postoperative scarring in the left lower lobe and post infectious scarring throughout the visualize lung bases, as above. 3. Areas of cylindrical bronchiectasis throughout the lung bases bilaterally. 4. Aortic atherosclerosis, in addition to left main and left circumflex coronary artery disease. Assessment for potential risk factor modification, dietary therapy or pharmacologic therapy may be warranted, if clinically indicated. 5. Interval development of a compression fracture at L1 with approximately 50% loss of central vertebral body height. This is age indeterminate, but new compared to the prior examination. Other compression fractures at T8 and T12 appear similar to the prior examination. 6. Diffuse bronchial wall thickening with moderate to severe centrilobular emphysema; imaging findings compatible with the reported clinical history of COPD. 7. Small hiatal hernia.   Electronically Signed   By: Vinnie Langton M.D.   On: 06/05/2016 08:47     ASSESSMENT AND PLAN:  Stage IB bronchoalveolar carcinoma,. Well-differentiated the left lower lobe status post resection on 12/21/2010. Former smoker Centrilobular emphysema Compression Fracture at  L1 Cough/congestion/bronchitis, haemophilus influenza  I reviewed her CT imaging with her. I advised her that there is no evidence of recurrent disease. She is aware that she has COPD. She continues to follow with pulmonology, she has scheduled followup on September 19th.   I have prescribed her a course of zithromax for her ongoing cough, sputum culture from 5/25 was reviewed.   Will refer her to Dr. Estanislado Pandy for L1 compression fracture.    I have recommended return for ongoing observation and physical exam in 6 months to which she is agreeable. She will be due for repeat CT imaging in August. We have ordered this for her today. She will follow-up with [place] post imaging to review.   NCCN guidelines for Non-Small Cell Lung Cancer Surveillance are as follows:  A. H+P every 6-12 months and CT of chest every 6 months for the first two years  B. Low-dose spiral CT chest annually after first two years of surveillance CTs   C. PET scan not typically indicated for surveillance  D. Smoking cessation advice, counseling, and pharmacotherapy  Mammogram set up for October.  Follow up in 6 months. We discussed annual non contrast surveillance screening. After her next visit,  we will extend her follow up to annually.  All questions were answered. The patient knows to call the clinic with any problems, questions or concerns. We can certainly see the patient much sooner if necessary.   This document serves as a record of services personally performed by Ancil Linsey, MD. It was created on her behalf by Elmyra Ricks, a trained medical scribe. The creation of this record is based on the scribe's personal observations and the provider's statements to them. This document has been checked and approved by the attending provider.  I have reviewed the above documentation for accuracy and completeness, and I agree with the above.  This note was electronically signed.  Kelby Fam. Whitney Muse, MD

## 2016-06-05 NOTE — Patient Instructions (Addendum)
[place] at [facility] Discharge Instructions  RECOMMENDATIONS MADE BY THE CONSULTANT AND ANY TEST RESULTS WILL BE SENT TO YOUR REFERRING PHYSICIAN.  You saw Dr. Whitney Muse today. Mammogram will be scheduled. Follow up in 6 months with Tom. You are being referred to Dr. Estanislado Pandy for the compression fracture at L1. Zithromax was sent to [facility].    Thank you for choosing [place] at [facility] to provide your oncology and hematology care.  To afford each patient quality time with our provider, please arrive at least 15 minutes before your scheduled appointment time.   Beginning January 23rd 2017 lab work for the [facility] will be done in the  Main lab at [facility] on 1st floor. If you have a lab appointment with the [facility] please come in thru the  Main Entrance and check in at the main information desk  You need to re-schedule your appointment should you arrive 10 or more minutes late.  We strive to give you quality time with our providers, and arriving late affects you and other patients whose appointments are after yours.  Also, if you no show three or more times for appointments you may be dismissed from the clinic at the providers discretion.     Again, thank you for choosing [facility].  Our hope is that these requests will decrease the amount of time that you wait before being seen by our physicians.       _____________________________________________________________  Should you have questions after your visit to [facility], please contact our office at [phone] between the hours of 8:30 a.m. and 4:30 p.m.  Voicemails left after 4:30 p.m. will not be returned until the following business day.  For prescription refill requests, have your pharmacy contact our office.         Resources For Cancer Patients and their Caregivers ? American Cancer Society: Can assist with  transportation, wigs, general needs, runs Look Good Feel Better.        225-299-5750 ? Cancer Care: Provides financial assistance, online support groups, medication/co-pay assistance.  1-800-813-HOPE (774) 799-8803) ? [place] Assists [place] cancer patients and their families through emotional , educational and financial support.  763 109 7754 ? [place] DSS Where to apply for food stamps, Medicaid and utility assistance. (207)722-3608 ? [facility]: Transportation to medical appointments. 717-790-0081 ? Social Security Administration: May apply for disability if have a Stage IV cancer. (504)126-3282 937 478 4558 ? [employer], Disability and Transit Services: Assists with nutrition, care and transit needs. [place] Support Programs: '@10RELATIVEDAYS'$ @ > Cancer Support Group  2nd Tuesday of the month 1pm-2pm, Journey Room  > Creative Journey  3rd Tuesday of the month 1130am-1pm, Journey Room  > Look Good Feel Better  1st Wednesday of the month 10am-12 noon, Journey Room (Call [place] to register [phone])

## 2016-06-07 ENCOUNTER — Encounter: Payer: Self-pay | Admitting: Family Medicine

## 2016-06-10 ENCOUNTER — Encounter (HOSPITAL_COMMUNITY): Payer: Self-pay | Admitting: Hematology & Oncology

## 2016-06-12 ENCOUNTER — Ambulatory Visit (HOSPITAL_COMMUNITY): Payer: PPO | Admitting: Hematology & Oncology

## 2016-06-14 ENCOUNTER — Other Ambulatory Visit: Payer: Self-pay | Admitting: Family Medicine

## 2016-06-14 MED ORDER — OMEPRAZOLE 40 MG PO CPDR: 40.0000 mg | DELAYED_RELEASE_CAPSULE | Freq: Every day | ORAL | 3 refills | Status: DC

## 2016-06-19 ENCOUNTER — Other Ambulatory Visit (HOSPITAL_COMMUNITY): Payer: Self-pay | Admitting: Interventional Radiology

## 2016-06-19 ENCOUNTER — Encounter: Payer: Self-pay | Admitting: Family Medicine

## 2016-06-19 DIAGNOSIS — IMO0002 Reserved for concepts with insufficient information to code with codable children: Secondary | ICD-10-CM

## 2016-06-21 ENCOUNTER — Encounter: Payer: Self-pay | Admitting: Family Medicine

## 2016-06-22 ENCOUNTER — Encounter: Payer: Self-pay | Admitting: Family Medicine

## 2016-06-22 DIAGNOSIS — M25551 Pain in right hip: Secondary | ICD-10-CM | POA: Diagnosis not present

## 2016-06-25 ENCOUNTER — Encounter: Payer: Self-pay | Admitting: Family Medicine

## 2016-06-26 ENCOUNTER — Ambulatory Visit: Payer: PPO | Admitting: Pulmonary Disease

## 2016-06-27 ENCOUNTER — Telehealth (HOSPITAL_COMMUNITY): Payer: Self-pay | Admitting: Radiology

## 2016-06-27 NOTE — Telephone Encounter (Signed)
Pt called and wanted to cancel her consult scheduled with Dr. Estanislado Pandy on 07/04/16. She has decided not to pursue this. Appt was canceled. JM

## 2016-07-02 ENCOUNTER — Encounter: Payer: Self-pay | Admitting: Family Medicine

## 2016-07-02 MED ORDER — PROMETHAZINE HCL 25 MG PO TABS: 25.0000 mg | ORAL_TABLET | Freq: Four times a day (QID) | ORAL | 0 refills | Status: DC | PRN

## 2016-07-04 ENCOUNTER — Ambulatory Visit (HOSPITAL_COMMUNITY): Admission: RE | Admit: 2016-07-04 | Payer: PPO | Source: Ambulatory Visit

## 2016-07-05 DIAGNOSIS — R0902 Hypoxemia: Secondary | ICD-10-CM | POA: Diagnosis not present

## 2016-07-05 DIAGNOSIS — C3490 Malignant neoplasm of unspecified part of unspecified bronchus or lung: Secondary | ICD-10-CM | POA: Diagnosis not present

## 2016-07-05 DIAGNOSIS — J9621 Acute and chronic respiratory failure with hypoxia: Secondary | ICD-10-CM | POA: Diagnosis not present

## 2016-07-05 DIAGNOSIS — R0602 Shortness of breath: Secondary | ICD-10-CM | POA: Diagnosis not present

## 2016-07-12 ENCOUNTER — Encounter: Payer: Self-pay | Admitting: Family Medicine

## 2016-07-12 ENCOUNTER — Ambulatory Visit (INDEPENDENT_AMBULATORY_CARE_PROVIDER_SITE_OTHER): Payer: PPO | Admitting: Family Medicine

## 2016-07-12 VITALS — BP 126/68 | HR 76 | Temp 98.1°F | Resp 18 | Ht 66.0 in | Wt 131.0 lb

## 2016-07-12 DIAGNOSIS — G500 Trigeminal neuralgia: Secondary | ICD-10-CM

## 2016-07-12 DIAGNOSIS — Z23 Encounter for immunization: Secondary | ICD-10-CM | POA: Diagnosis not present

## 2016-07-12 MED ORDER — OXCARBAZEPINE 300 MG PO TABS: 300.0000 mg | ORAL_TABLET | Freq: Two times a day (BID) | ORAL | 3 refills | Status: DC

## 2016-07-12 NOTE — Progress Notes (Signed)
Subjective:    Patient ID: [name], female    DOB: September 27, 1941, 75 y.o.   MRN: [id number]  HPI6/23/17 Patient reports worsening cough over the last 4 weeks. She's been treated for bronchitis with doxycycline, prednisone, and a Z-Pak without improvement. She reports increasing shortness of breath and wheezing. She is back on her oxygen. Her pulmonologist just started her on Symbicort. On examination today she has diminished breath sounds bilaterally with faint bibasilar crackles and rhonchorous breath sounds and some expiratory wheezing. In late May had a sputum culture that revealed Haemophilus influenza. Also reports right-sided sciatica with pain radiating from her posterior hip down to her knee. Overdue to recheck a sodium level.  At that time, my plan was: Given her history of hyponatremia will monitor her sodium level. She has a COPD exacerbation and possibly community-acquired pneumonia. Given her history of allergies to fluoroquinolones, I will treat the patient with Ceftin 500 mg by mouth twice a day for 10 days in addition to a Z-Pak to cover atypicals. I will also treat her for a COPD exacerbation with prednisone 40 mg a day for 7 days. Recheck in one week or sooner if worse. I believe the prednisone will likely help her sciatica  04/09/16 Patient is here today for follow-up. Her cough has resolved. Her breathing is better although she still reports dyspnea on exertion and shortness of breath with minimal activity. She continues to require oxygen. She is currently on Symbicort. She also continues to complain of right posterior hip pain that radiates into her medial thigh. This did not improve on prednisone.  At that time, my plan was: Discontinue Symbicort and switch the patient to anoro one inhalation a day and recheck in 3 weeks to reassess her breathing on 2 long-acting bronchodilators. 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  Shortness of breath has improved. Her cough has improved. She denies any sputum production. She denies any fevers or chills or pleurisy. She denies any hemoptysis.  At that time, my plan was: Continue anoro inh qday for COPD.  07/12/16 Patient has a history of trigeminal neuralgia. We tried Tegretol without relief. However she saw benefit from Trileptal. Discontinue the medication last year due to hyponatremia. Over the last month, the pain has returned. The severity is waxing and waning. The pain radiates down the length of her mandible. She also has pain occasionally radiate to her nose area she denies any pain above her eyes. The pain seems to follow the second and third branch of the trigeminal nerve. It is burning and sharp in intensity. She denies any sinus pain. There is no erythema or exudate in the posterior oropharynx. There are no visible abnormalities on the roof of her mouth or on her gum. There is no lymphadenopathy or palpable mass in her neck. Left auditory canal is clear and the tympanic membrane appears clear. There is no visible deformity on the left side of her face that would explain her pain Past Medical History:  Diagnosis Date  . Adenocarcinoma of lung ([place]) 12/2010   left lung/surg only  . Allergic rhinitis   . 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 Anxiety disorder   . Aortic aneurysm ([place])   . Complication of anesthesia 2011   bloodpressure dropped during colonoscopy, not problems since  .  Compression fracture   . COPD (chronic obstructive pulmonary disease) ([place])   . Depression   . Diastolic dysfunction   . Diverticulosis   . Dysphagia   . Emphysema lung ([place]) 11/08/2014  . Hiatal hernia   . Hypothyroidism   . IBS (irritable bowel syndrome)   . On home O2 12/15/2013   chronic hypoxia  . PUD (peptic ulcer disease)    89yr  . Shortness of breath    with exertion  .  Trigeminal neuralgia    Past Surgical History:  Procedure Laterality Date  . ABDOMINAL HYSTERECTOMY    . BACK SURGERY  January 13, 2014  . CATARACT EXTRACTION    . CATARACT EXTRACTION W/PHACO  09/02/2012   Procedure: CATARACT EXTRACTION PHACO AND INTRAOCULAR LENS PLACEMENT (IOC);  Surgeon: MElta GuadeloupeT. SGershon Crane MD;  Location: [facility];  Service: Ophthalmology;  Laterality: Left;  CDE:10.35  . CHOLECYSTECTOMY    . 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 COLONOSCOPY N/A 11/23/2014   Procedure: COLONOSCOPY;  Surgeon: SDanie Binder MD;  Location: [facility] SUITE;  Service: Endoscopy;  Laterality: N/A;  930   . ENDARTERECTOMY Right 08/21/2013   Procedure: RIGHT CAROTID ANEURYSM RESECTION;  Surgeon: TRosetta Posner MD;  [facility];  Service: Vascular;  Laterality: Right;  . ESOPHAGOGASTRODUODENOSCOPY  11/13/2009   w/dilation to 15m gastric ulceration (H.Pylori) s/p treatment  . ESOPHAGOGASTRODUODENOSCOPY  11/21/2009   distal esophageal web, gastritis  . ESOPHAGOGASTRODUODENOSCOPY  03/14/12   SLZWC:HENIDPOEUn the distal esophagus/Mild gastritis/small HH. + H.pylori, prescribed Pylera. Finished treatment.   . fatty tumor removal from lt groin    . FRACTURE SURGERY Left    hand and left ring finger  . LUNG CANCER SURGERY  12/2010   Left VATS, minithoracotomy, LLL superior segmentectomy  . ORIF WRIST FRACTURE Left 04/09/2014   Procedure: OPEN REDUCTION INTERNAL FIXATION (ORIF) WRIST FRACTURE;  Surgeon: TiRenette ButtersMD;  [facility]: [facility];  Laterality: Left;  . PERCUTANEOUS PINNING Left 04/09/2014   Procedure: PERCUTANEOUS PINNING EXTREMITY;  Surgeon: TiRenette ButtersMD;  [facility]: [facility]ics;  Laterality: Left;  . TUBAL LIGATION    . vocal cord surgery  02/06/2011   laryngoscopy with bilateral vocal cord Radiesse injection for vocal cord paralysis  . YAG LASER APPLICATION Left 1223/53/6144 Procedure: YAG LASER APPLICATION;   Surgeon: MaRutherford GuysMD;  Location: [facility];  Service: Ophthalmology;  Laterality: Left;   Current Outpatient Prescriptions on File Prior to Visit  Medication Sig Dispense Refill  . albuterol (PROVENTIL HFA;VENTOLIN HFA) 108 (90 Base) MCG/ACT inhaler Inhale 2 puffs into the lungs every 6 (six) hours as needed for wheezing or shortness of breath. (Patient not taking: Reported on 06/05/2016) 1 Inhaler 6  . albuterol (PROVENTIL) (2.5 MG/3ML) 0.083% nebulizer solution Take 3 mLs (2.5 mg total) by nebulization every 6 (six) hours as needed for wheezing or shortness of breath. (Patient not taking: Reported on 06/05/2016) 75 mL 12  . ALPRAZolam (XANAX) 0.5 MG tablet Take 1 tablet (0.5 mg total) by mouth 2 (two) times daily as needed for anxiety or sleep. 60 tablet 1  . azithromycin (ZITHROMAX) 250 MG tablet Take 2 tabs by mouth on day 1 then 1 tab daily thereafter until finished. 11 each 0  . calcium citrate-vitamin D (CITRACAL+D) 315-200 MG-UNIT per tablet Take 1 tablet by mouth daily.     . citalopram (CELEXA) 40 MG tablet Take  1 tablet (40 mg total) by mouth at bedtime. 90 tablet 4  . cyclobenzaprine (FLEXERIL) 10 MG tablet Take 1 tablet (10 mg total) by mouth 3 (three) times daily as needed for muscle spasms. (Patient not taking: Reported on 06/05/2016) 180 tablet 1  . dicyclomine (BENTYL) 20 MG tablet Take 1 tablet (20 mg total) by mouth every 8 (eight) hours as needed for spasms. 90 tablet 1  . Flaxseed, Linseed, (FLAXSEED OIL) OIL Take 1 capsule by mouth daily.    Marland Kitchen levothyroxine (SYNTHROID, LEVOTHROID) 25 MCG tablet Take 1 tablet (25 mcg total) by mouth daily before breakfast. 90 tablet 4  . omeprazole (PRILOSEC) 40 MG capsule Take 1 capsule (40 mg total) by mouth daily. 90 capsule 3  . promethazine (PHENERGAN) 25 MG tablet Take 1 tablet (25 mg total) by mouth every 6 (six) hours as needed for nausea or vomiting. 20 tablet 0  . Spacer/Aero-Holding Chambers (AEROCHAMBER MV) inhaler Use as instructed 1  each 0  . umeclidinium-vilanterol (ANORO ELLIPTA) 62.5-25 MCG/INH AEPB Inhale 1 puff into the lungs daily. 90 each 3   No current facility-administered medications on file prior to visit.    Allergies  Allergen Reactions  . Ciprofloxacin Hcl Hives  . Iohexol      Desc: Per [facility], pt is allergic to IV contrast, no type of reaction was available. Pt cannot remember but first reacted around 15 yrs ago from an IVP. Notes were date from 2009 at urology center., Onset Date: [id number]   . Prozac [Fluoxetine Hcl] Rash  . Sulfonamide Derivatives Hives and Rash   Social History   Social History  . Marital status: Divorced    Spouse name: N/A  . Number of children: N/A  . Years of education: N/A   Occupational History  . Not on file.   Social History Main Topics  . Smoking status: Former Smoker    Packs/day: 0.50    Years: 20.00    Types: Cigarettes    Quit date: 12/21/2010  . Smokeless tobacco: Never Used     Comment: smoking cessation info given and reviewed   . Alcohol use 0.0 oz/week     Comment: seldom  . Drug use: No  . Sexual activity: Yes    Birth control/ protection: Surgical   Other Topics Concern  . Not on file   Social History Narrative  . No narrative on file      Review of Systems  All other systems reviewed and are negative.      Objective:   Physical Exam  Neck: No JVD present.  Cardiovascular: Normal rate, regular rhythm and normal heart sounds.   Pulmonary/Chest: Effort normal. She has no wheezes. She has no rales.  Abdominal: Soft. Bowel sounds are normal.  Musculoskeletal: She exhibits no edema.  Lymphadenopathy:    She has no cervical adenopathy.  Vitals reviewed.         Assessment & Plan:    Trigeminal neuralgia - Plan: Oxcarbazepine (TRILEPTAL) 300 MG tablet  Resume Trileptal 300 mg by mouth twice a day. Recheck in 2-4 weeks to determine efficacy. Monitor for dizziness. Patient also received her flu shot while she is here

## 2016-07-12 NOTE — Addendum Note (Signed)
Addended by: Shary Decamp B on: 07/12/2016 11:09 AM   Modules accepted: Orders

## 2016-08-04 DIAGNOSIS — J9621 Acute and chronic respiratory failure with hypoxia: Secondary | ICD-10-CM | POA: Diagnosis not present

## 2016-08-04 DIAGNOSIS — R0902 Hypoxemia: Secondary | ICD-10-CM | POA: Diagnosis not present

## 2016-08-04 DIAGNOSIS — R0602 Shortness of breath: Secondary | ICD-10-CM | POA: Diagnosis not present

## 2016-08-04 DIAGNOSIS — C3490 Malignant neoplasm of unspecified part of unspecified bronchus or lung: Secondary | ICD-10-CM | POA: Diagnosis not present

## 2016-08-06 ENCOUNTER — Ambulatory Visit (HOSPITAL_COMMUNITY): Payer: PPO

## 2016-08-13 ENCOUNTER — Encounter: Payer: Self-pay | Admitting: Family Medicine

## 2016-08-16 ENCOUNTER — Encounter: Payer: Self-pay | Admitting: Family Medicine

## 2016-08-19 ENCOUNTER — Encounter: Payer: Self-pay | Admitting: Family Medicine

## 2016-08-20 ENCOUNTER — Ambulatory Visit (HOSPITAL_COMMUNITY)
Admission: RE | Admit: 2016-08-20 | Discharge: 2016-08-20 | Disposition: A | Discharge status: Home / Self Care | Payer: PPO | Source: Ambulatory Visit | Attending: Hematology & Oncology | Admitting: Hematology & Oncology

## 2016-08-20 ENCOUNTER — Encounter: Payer: Self-pay | Admitting: Family Medicine

## 2016-08-20 DIAGNOSIS — Z1231 Encounter for screening mammogram for malignant neoplasm of breast: Secondary | ICD-10-CM

## 2016-08-22 DIAGNOSIS — S32010A Wedge compression fracture of first lumbar vertebra, initial encounter for closed fracture: Secondary | ICD-10-CM | POA: Insufficient documentation

## 2016-08-23 ENCOUNTER — Encounter: Payer: Self-pay | Admitting: Family Medicine

## 2016-08-27 ENCOUNTER — Encounter: Payer: Self-pay | Admitting: Family Medicine

## 2016-08-28 ENCOUNTER — Encounter: Payer: Self-pay | Admitting: Family Medicine

## 2016-08-30 IMAGING — CT CT CHEST W/O CM
2 of 3 series · 15 of 36 positions shown, 18 images · non-contrast
Comparison: Chest CT 10/26/2013

CLINICAL DATA: Annual surveillance for non-small cell lung cancer.
Surgery 3923. No new chemotherapy. No radiation therapy. Subsequent
treatment strategy.

EXAM:
CT CHEST WITHOUT CONTRAST
TECHNIQUE: Multidetector CT imaging of the chest was performed following the
standard protocol without IV contrast..

[Series 2: chestroutine 5.0 b40f · axial · 0.66mm/px · z∈[-418,-143]mm · 12 of 65 slices shown, 15 images]
[im 5/65  mediastinal]
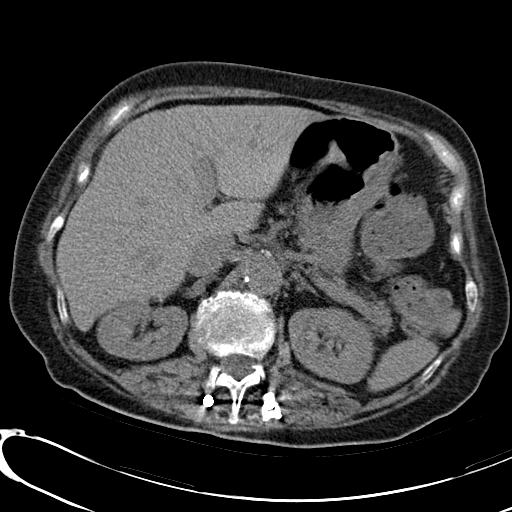
[im 5/65  lung]
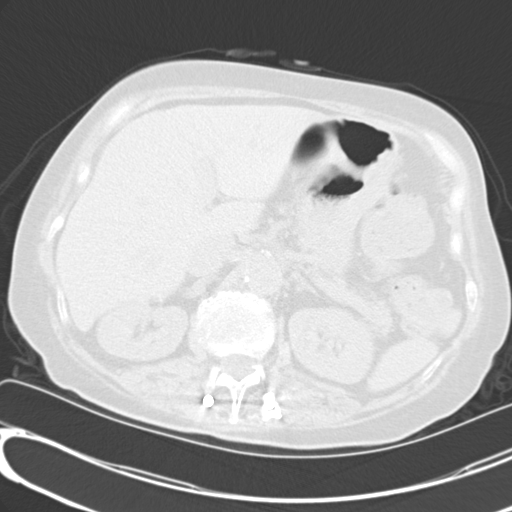
[im 10/65  lung]
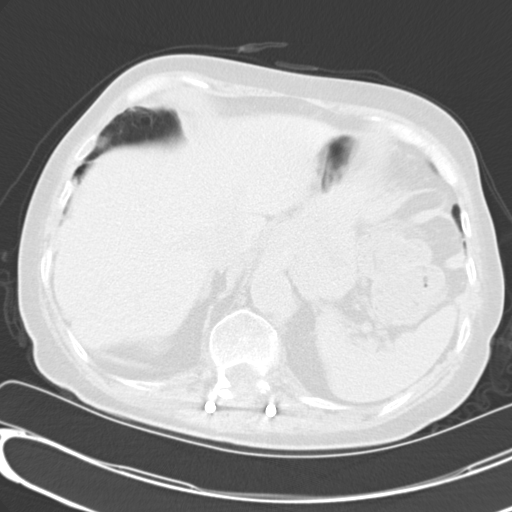
[im 15/65  lung]
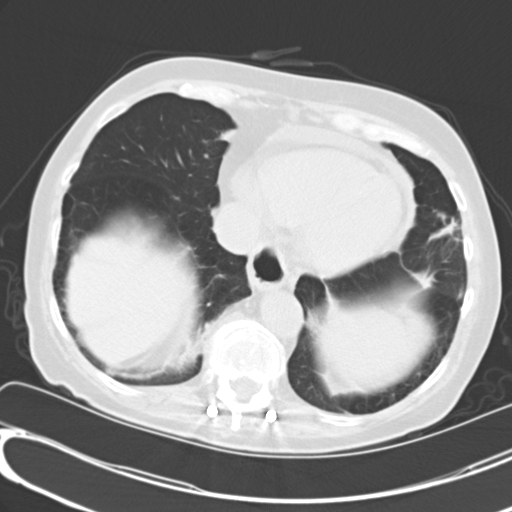
[im 19/65  lung]
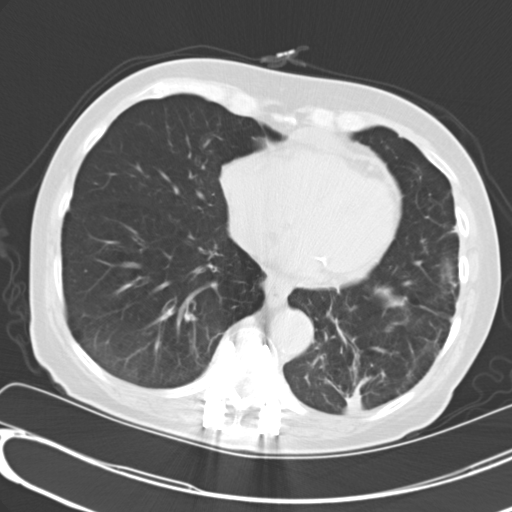
[im 24/65  mediastinal]
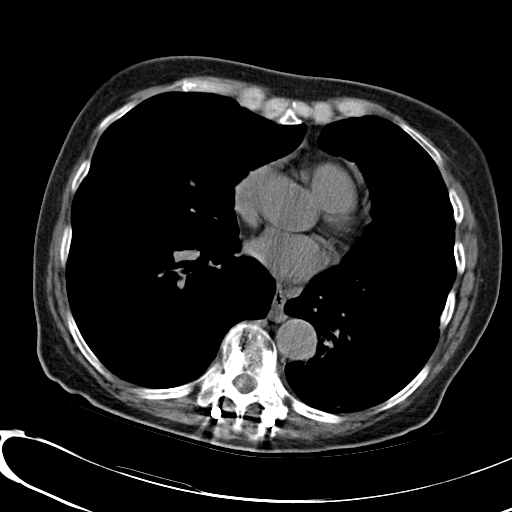
[im 24/65  lung]
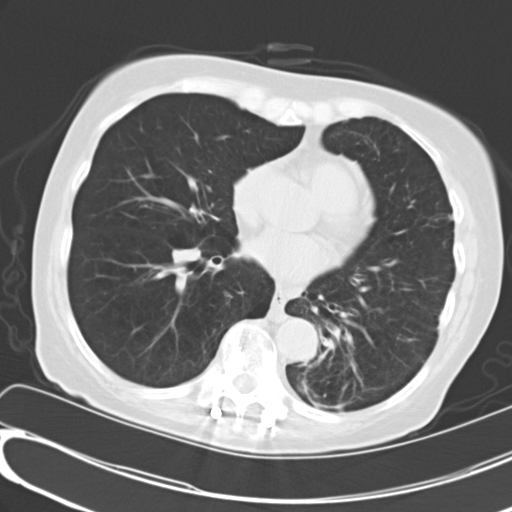
[im 29/65  lung]
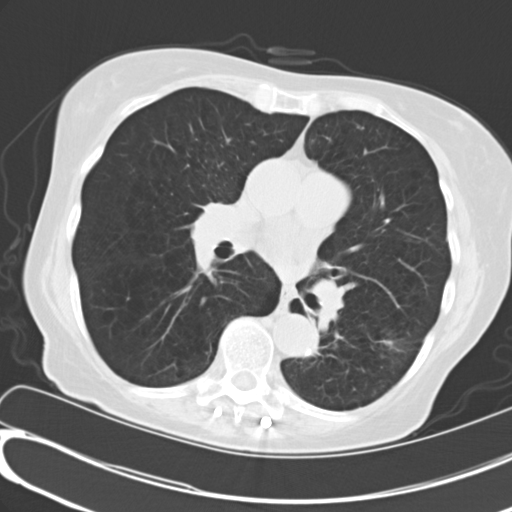
[im 36/65  lung]
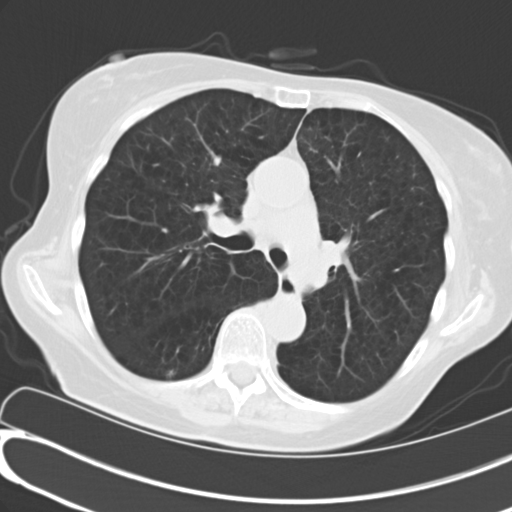
[im 41/65  lung]
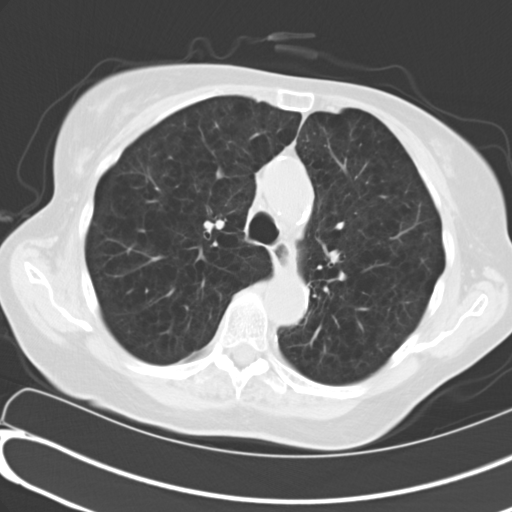
[im 46/65  mediastinal]
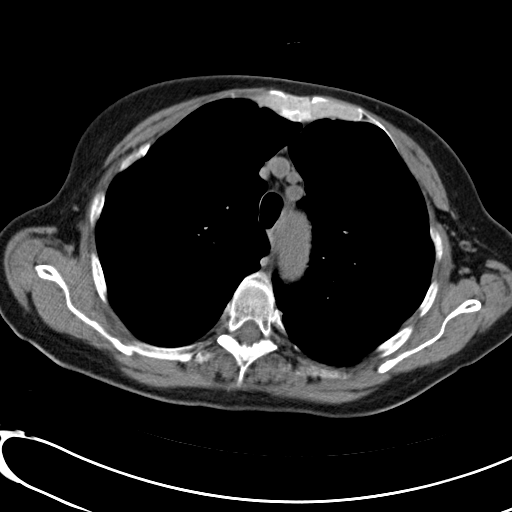
[im 46/65  lung]
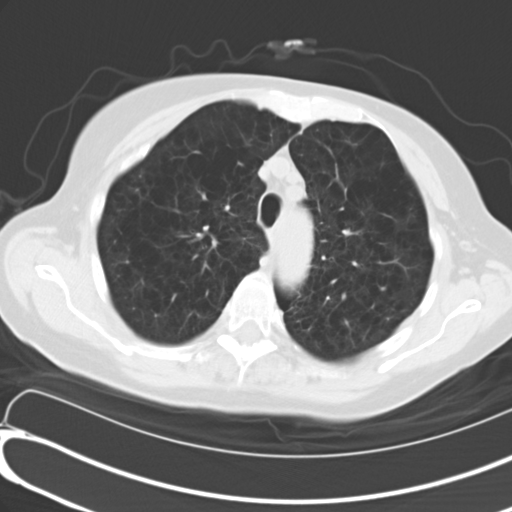
[im 50/65  lung]
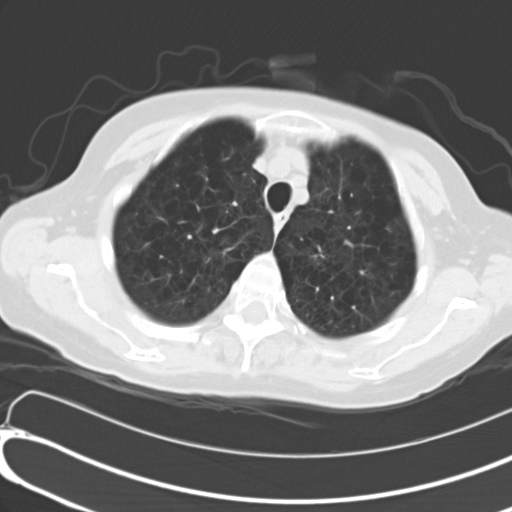
[im 55/65  lung]
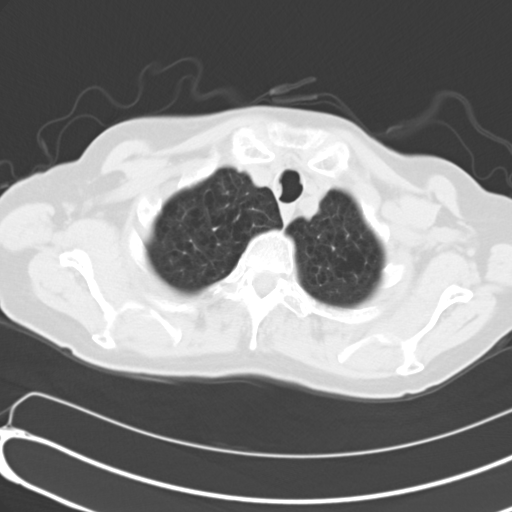
[im 60/65  lung]
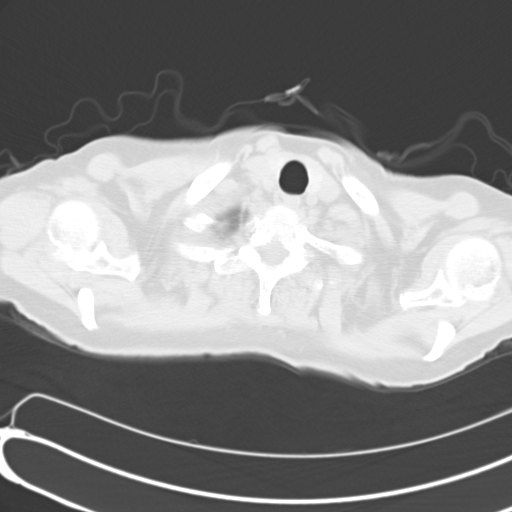

[Series 4: mpr coro 3mm · coronal · 0.60mm/px · 3 of 80 slices shown]
[im 16/80  lung]
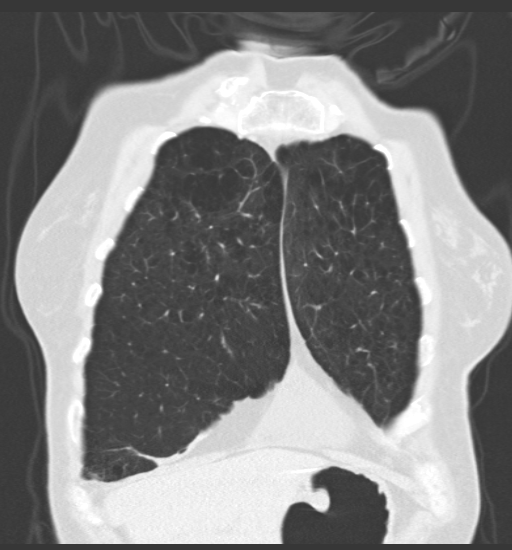
[im 32/80  lung]
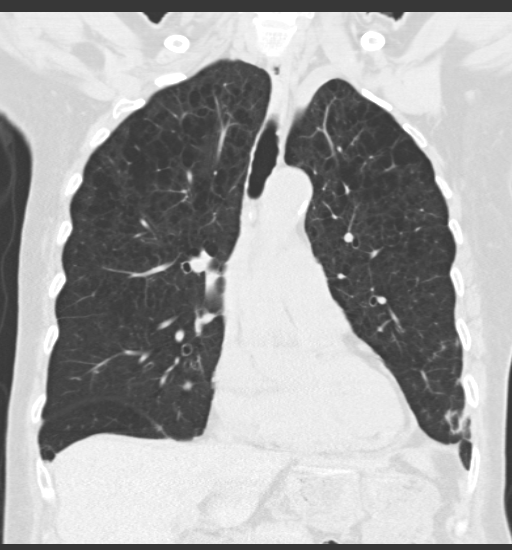
[im 48/80  lung]
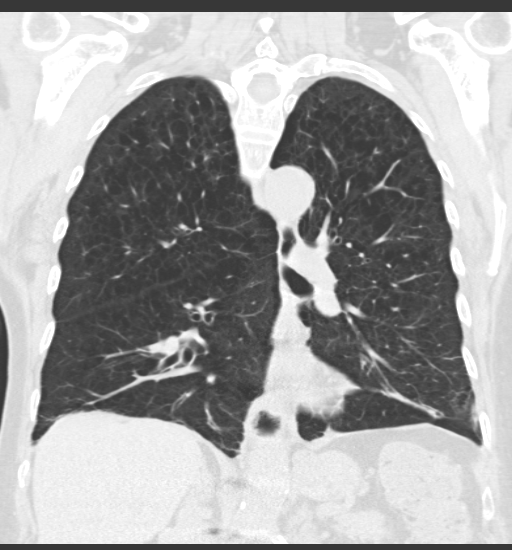

[15 of 36 positions shown; findings below may reference images not displayed]

FINDINGS: Mediastinum/Nodes: No axillary supraclavicular lymphadenopathy. No
mediastinal hilar lymphadenopathy. No pericardial discussed at trace
pericardial effusion is unchanged. The distal esophagus is
gas-filled and not changed from prior.

Lungs/Pleura: Extensive centrilobular emphysema. No suspicious
pulmonary nodules. There is linear scarring and pleural parenchymal
thickening at the left lung base which is similar prior. One focus
of nodular pleural parenchymal thickening is increased compared to
prior measuring 9 mm (image 46, series 3). Surgical clips in the
left infrahilar region.

Upper abdomen: Limited view of the liver, kidneys, pancreas are
unremarkable. Normal adrenal glands.

Musculoskeletal: No aggressive osseous lesion. Posterior thoracic
and lumbar fusion spanning a severe compression fracture at T12 with
no interval change.
IMPRESSION: 1. Increased nodular pleural parenchymal thickening at the left lung
base is favored to represent benign pleural reaction. Recommend
attention on follow-up.
2. Stable postsurgical change in the left infrahilar lung.
3. Severe centrilobular emphysema.
4. No mediastinal adenopathy.

## 2016-09-04 DIAGNOSIS — C3490 Malignant neoplasm of unspecified part of unspecified bronchus or lung: Secondary | ICD-10-CM | POA: Diagnosis not present

## 2016-09-04 DIAGNOSIS — J9621 Acute and chronic respiratory failure with hypoxia: Secondary | ICD-10-CM | POA: Diagnosis not present

## 2016-09-04 DIAGNOSIS — R0902 Hypoxemia: Secondary | ICD-10-CM | POA: Diagnosis not present

## 2016-09-04 DIAGNOSIS — R0602 Shortness of breath: Secondary | ICD-10-CM | POA: Diagnosis not present

## 2016-09-10 ENCOUNTER — Emergency Department (HOSPITAL_COMMUNITY): Payer: PPO

## 2016-09-10 ENCOUNTER — Observation Stay (HOSPITAL_COMMUNITY): Payer: PPO

## 2016-09-10 ENCOUNTER — Inpatient Hospital Stay (HOSPITAL_COMMUNITY)
Admission: EM | Admit: 2016-09-10 | Discharge: 2016-09-14 | DRG: 312 | Disposition: A | Discharge status: Home Health | Payer: PPO | Attending: Internal Medicine | Admitting: Internal Medicine

## 2016-09-10 ENCOUNTER — Encounter (HOSPITAL_COMMUNITY): Payer: Self-pay

## 2016-09-10 ENCOUNTER — Observation Stay (HOSPITAL_BASED_OUTPATIENT_CLINIC_OR_DEPARTMENT_OTHER): Payer: PPO

## 2016-09-10 DIAGNOSIS — Z85118 Personal history of other malignant neoplasm of bronchus and lung: Secondary | ICD-10-CM

## 2016-09-10 DIAGNOSIS — R9431 Abnormal electrocardiogram [ECG] [EKG]: Secondary | ICD-10-CM | POA: Diagnosis not present

## 2016-09-10 DIAGNOSIS — J441 Chronic obstructive pulmonary disease with (acute) exacerbation: Secondary | ICD-10-CM | POA: Diagnosis not present

## 2016-09-10 DIAGNOSIS — G8929 Other chronic pain: Secondary | ICD-10-CM | POA: Diagnosis present

## 2016-09-10 DIAGNOSIS — I519 Heart disease, unspecified: Secondary | ICD-10-CM | POA: Diagnosis not present

## 2016-09-10 DIAGNOSIS — Z9049 Acquired absence of other specified parts of digestive tract: Secondary | ICD-10-CM

## 2016-09-10 DIAGNOSIS — E039 Hypothyroidism, unspecified: Secondary | ICD-10-CM | POA: Diagnosis not present

## 2016-09-10 DIAGNOSIS — W06XXXA Fall from bed, initial encounter: Secondary | ICD-10-CM | POA: Diagnosis present

## 2016-09-10 DIAGNOSIS — I5189 Other ill-defined heart diseases: Secondary | ICD-10-CM | POA: Diagnosis present

## 2016-09-10 DIAGNOSIS — J449 Chronic obstructive pulmonary disease, unspecified: Secondary | ICD-10-CM | POA: Diagnosis present

## 2016-09-10 DIAGNOSIS — F419 Anxiety disorder, unspecified: Secondary | ICD-10-CM | POA: Diagnosis not present

## 2016-09-10 DIAGNOSIS — G4710 Hypersomnia, unspecified: Secondary | ICD-10-CM | POA: Diagnosis not present

## 2016-09-10 DIAGNOSIS — C3492 Malignant neoplasm of unspecified part of left bronchus or lung: Secondary | ICD-10-CM | POA: Diagnosis not present

## 2016-09-10 DIAGNOSIS — J209 Acute bronchitis, unspecified: Secondary | ICD-10-CM | POA: Diagnosis not present

## 2016-09-10 DIAGNOSIS — M6281 Muscle weakness (generalized): Secondary | ICD-10-CM

## 2016-09-10 DIAGNOSIS — J438 Other emphysema: Secondary | ICD-10-CM | POA: Diagnosis not present

## 2016-09-10 DIAGNOSIS — S22000A Wedge compression fracture of unspecified thoracic vertebra, initial encounter for closed fracture: Secondary | ICD-10-CM | POA: Diagnosis present

## 2016-09-10 DIAGNOSIS — Z9981 Dependence on supplemental oxygen: Secondary | ICD-10-CM

## 2016-09-10 DIAGNOSIS — I714 Abdominal aortic aneurysm, without rupture: Secondary | ICD-10-CM | POA: Diagnosis present

## 2016-09-10 DIAGNOSIS — J439 Emphysema, unspecified: Secondary | ICD-10-CM

## 2016-09-10 DIAGNOSIS — J440 Chronic obstructive pulmonary disease with acute lower respiratory infection: Secondary | ICD-10-CM | POA: Diagnosis present

## 2016-09-10 DIAGNOSIS — Z91041 Radiographic dye allergy status: Secondary | ICD-10-CM

## 2016-09-10 DIAGNOSIS — I729 Aneurysm of unspecified site: Secondary | ICD-10-CM | POA: Diagnosis not present

## 2016-09-10 DIAGNOSIS — Z882 Allergy status to sulfonamides status: Secondary | ICD-10-CM

## 2016-09-10 DIAGNOSIS — Z87891 Personal history of nicotine dependence: Secondary | ICD-10-CM

## 2016-09-10 DIAGNOSIS — Z79899 Other long term (current) drug therapy: Secondary | ICD-10-CM

## 2016-09-10 DIAGNOSIS — F513 Sleepwalking [somnambulism]: Secondary | ICD-10-CM | POA: Diagnosis not present

## 2016-09-10 DIAGNOSIS — F329 Major depressive disorder, single episode, unspecified: Secondary | ICD-10-CM | POA: Diagnosis not present

## 2016-09-10 DIAGNOSIS — S3992XA Unspecified injury of lower back, initial encounter: Secondary | ICD-10-CM | POA: Diagnosis not present

## 2016-09-10 DIAGNOSIS — E871 Hypo-osmolality and hyponatremia: Secondary | ICD-10-CM | POA: Diagnosis not present

## 2016-09-10 DIAGNOSIS — J9611 Chronic respiratory failure with hypoxia: Secondary | ICD-10-CM | POA: Diagnosis present

## 2016-09-10 DIAGNOSIS — R51 Headache: Secondary | ICD-10-CM | POA: Diagnosis not present

## 2016-09-10 DIAGNOSIS — I110 Hypertensive heart disease with heart failure: Secondary | ICD-10-CM | POA: Diagnosis not present

## 2016-09-10 DIAGNOSIS — M4854XA Collapsed vertebra, not elsewhere classified, thoracic region, initial encounter for fracture: Secondary | ICD-10-CM | POA: Diagnosis not present

## 2016-09-10 DIAGNOSIS — K589 Irritable bowel syndrome without diarrhea: Secondary | ICD-10-CM | POA: Diagnosis not present

## 2016-09-10 DIAGNOSIS — R55 Syncope and collapse: Secondary | ICD-10-CM | POA: Diagnosis not present

## 2016-09-10 DIAGNOSIS — S3993XA Unspecified injury of pelvis, initial encounter: Secondary | ICD-10-CM | POA: Diagnosis not present

## 2016-09-10 DIAGNOSIS — I5032 Chronic diastolic (congestive) heart failure: Secondary | ICD-10-CM | POA: Diagnosis not present

## 2016-09-10 DIAGNOSIS — M545 Low back pain: Secondary | ICD-10-CM | POA: Diagnosis present

## 2016-09-10 DIAGNOSIS — R079 Chest pain, unspecified: Secondary | ICD-10-CM | POA: Diagnosis not present

## 2016-09-10 DIAGNOSIS — R0789 Other chest pain: Secondary | ICD-10-CM | POA: Diagnosis present

## 2016-09-10 DIAGNOSIS — R102 Pelvic and perineal pain: Secondary | ICD-10-CM | POA: Diagnosis not present

## 2016-09-10 DIAGNOSIS — Z881 Allergy status to other antibiotic agents status: Secondary | ICD-10-CM

## 2016-09-10 DIAGNOSIS — Z8249 Family history of ischemic heart disease and other diseases of the circulatory system: Secondary | ICD-10-CM | POA: Diagnosis not present

## 2016-09-10 DIAGNOSIS — R519 Headache, unspecified: Secondary | ICD-10-CM

## 2016-09-10 DIAGNOSIS — C3490 Malignant neoplasm of unspecified part of unspecified bronchus or lung: Secondary | ICD-10-CM | POA: Diagnosis present

## 2016-09-10 DIAGNOSIS — S299XXA Unspecified injury of thorax, initial encounter: Secondary | ICD-10-CM | POA: Diagnosis not present

## 2016-09-10 DIAGNOSIS — R0781 Pleurodynia: Secondary | ICD-10-CM

## 2016-09-10 DIAGNOSIS — M546 Pain in thoracic spine: Secondary | ICD-10-CM | POA: Diagnosis not present

## 2016-09-10 LAB — CBC WITH DIFFERENTIAL/PLATELET
Basophils Absolute: 0 10*3/uL (ref 0.0–0.1)
Basophils Relative: 0 %
Eosinophils Absolute: 0.2 10*3/uL (ref 0.0–0.7)
Eosinophils Relative: 2 %
HCT: 31.6 % — ABNORMAL LOW (ref 36.0–46.0)
Hemoglobin: 10 g/dL — ABNORMAL LOW (ref 12.0–15.0)
Lymphocytes Relative: 11 %
Lymphs Abs: 0.8 10*3/uL (ref 0.7–4.0)
MCH: 24.3 pg — ABNORMAL LOW (ref 26.0–34.0)
MCHC: 31.6 g/dL (ref 30.0–36.0)
MCV: 76.7 fL — ABNORMAL LOW (ref 78.0–100.0)
Monocytes Absolute: 0.8 10*3/uL (ref 0.1–1.0)
Monocytes Relative: 10 %
Neutro Abs: 5.7 10*3/uL (ref 1.7–7.7)
Neutrophils Relative %: 77 %
Platelets: 393 10*3/uL (ref 150–400)
RBC: 4.12 MIL/uL (ref 3.87–5.11)
RDW: 16.3 % — ABNORMAL HIGH (ref 11.5–15.5)
WBC: 7.5 10*3/uL (ref 4.0–10.5)

## 2016-09-10 LAB — ECHOCARDIOGRAM COMPLETE
E decel time: 458 msec
E/e' ratio: 9.39
FS: 41 % (ref 28–44)
Height: 65 in
IVS/LV PW RATIO, ED: 0.87
LA ID, A-P, ES: 31 mm
LA diam end sys: 31 mm
LA diam index: 1.82 cm/m2
LA vol A4C: 35.7 ml
LA vol index: 24.5 mL/m2
LA vol: 41.7 mL
LV E/e' medial: 9.39
LV E/e'average: 9.39
LV PW d: 11 mm — AB (ref 0.6–1.1)
LV dias vol index: 28 mL/m2
LV dias vol: 48 mL (ref 46–106)
LV e' LATERAL: 7.18 cm/s
LV sys vol index: 8 mL/m2
LV sys vol: 14 mL (ref 14–42)
LVOT SV: 87 mL
LVOT VTI: 25 cm
LVOT area: 3.46 cm2
LVOT diameter: 21 mm
LVOT peak grad rest: 7 mmHg
LVOT peak vel: 134 cm/s
Lateral S' vel: 15.8 cm/s
MV Dec: 458
MV pk A vel: 109 m/s
MV pk E vel: 67.4 m/s
RV sys press: 44 mmHg
Reg peak vel: 300 cm/s
Simpson's disk: 71
Stroke v: 34 ml
TAPSE: 20.7 mm
TDI e' lateral: 7.18
TDI e' medial: 7.51
TR max vel: 300 cm/s
Weight: 2215.18 oz

## 2016-09-10 LAB — URINALYSIS, ROUTINE W REFLEX MICROSCOPIC
Bilirubin Urine: NEGATIVE
Glucose, UA: NEGATIVE mg/dL
Hgb urine dipstick: NEGATIVE
Ketones, ur: NEGATIVE mg/dL
Leukocytes, UA: NEGATIVE
Nitrite: NEGATIVE
Protein, ur: NEGATIVE mg/dL
Specific Gravity, Urine: <1.005 — ABNORMAL LOW (ref 1.005–1.030)
pH: 6 (ref 5.0–8.0)

## 2016-09-10 LAB — BASIC METABOLIC PANEL
Anion gap: 7 (ref 5–15)
Anion gap: 7 (ref 5–15)
BUN: 7 mg/dL (ref 6–20)
BUN: 6 mg/dL (ref 6–20)
CO2: 29 mmol/L (ref 22–32)
CO2: 27 mmol/L (ref 22–32)
Calcium: 9.4 mg/dL (ref 8.9–10.3)
Calcium: 8.8 mg/dL — ABNORMAL LOW (ref 8.9–10.3)
Chloride: 88 mmol/L — ABNORMAL LOW (ref 101–111)
Chloride: 93 mmol/L — ABNORMAL LOW (ref 101–111)
Creatinine, Ser: 0.47 mg/dL (ref 0.44–1.00)
Creatinine, Ser: 0.39 mg/dL — ABNORMAL LOW (ref 0.44–1.00)
GFR calc Af Amer: >60 mL/min (ref >60)
GFR calc Af Amer: >60 mL/min (ref >60)
GFR calc non Af Amer: >60 mL/min (ref >60)
GFR calc non Af Amer: >60 mL/min (ref >60)
Glucose, Bld: 98 mg/dL (ref 65–99)
Glucose, Bld: 96 mg/dL (ref 65–99)
Potassium: 4.3 mmol/L (ref 3.5–5.1)
Potassium: 4 mmol/L (ref 3.5–5.1)
Sodium: 124 mmol/L — ABNORMAL LOW (ref 135–145)
Sodium: 127 mmol/L — ABNORMAL LOW (ref 135–145)

## 2016-09-10 LAB — CBC
HCT: 30.7 % — ABNORMAL LOW (ref 36.0–46.0)
Hemoglobin: 9.6 g/dL — ABNORMAL LOW (ref 12.0–15.0)
MCH: 23.7 pg — ABNORMAL LOW (ref 26.0–34.0)
MCHC: 31.3 g/dL (ref 30.0–36.0)
MCV: 75.8 fL — ABNORMAL LOW (ref 78.0–100.0)
Platelets: 461 10*3/uL — ABNORMAL HIGH (ref 150–400)
RBC: 4.05 MIL/uL (ref 3.87–5.11)
RDW: 16 % — ABNORMAL HIGH (ref 11.5–15.5)
WBC: 9.1 10*3/uL (ref 4.0–10.5)

## 2016-09-10 LAB — GLUCOSE, CAPILLARY: Glucose-Capillary: 86 mg/dL (ref 65–99)

## 2016-09-10 LAB — POC OCCULT BLOOD, ED: Fecal Occult Bld: NEGATIVE

## 2016-09-10 LAB — TROPONIN I: Troponin I: <0.03 ng/mL (ref <0.03)

## 2016-09-10 MED ORDER — SODIUM CHLORIDE 0.9 % IV SOLN
Freq: Once | INTRAVENOUS | Status: AC
  Administered 2016-09-10: 04:00:00 via INTRAVENOUS

## 2016-09-10 MED ORDER — HYDROCODONE-ACETAMINOPHEN 5-325 MG PO TABS
1.0000 | ORAL_TABLET | Freq: Once | ORAL | Status: AC
  Administered 2016-09-10: 1 via ORAL
  Filled 2016-09-10: qty 1

## 2016-09-10 MED ORDER — HYDROCODONE-ACETAMINOPHEN 5-325 MG PO TABS
1.0000 | ORAL_TABLET | Freq: Four times a day (QID) | ORAL | Status: DC | PRN
  Administered 2016-09-10 – 2016-09-12 (×6): 1 via ORAL
  Filled 2016-09-10 (×7): qty 1

## 2016-09-10 MED ORDER — SODIUM CHLORIDE 0.9 % IV SOLN: 250.0000 mL | INTRAVENOUS | Status: DC | PRN

## 2016-09-10 MED ORDER — SODIUM CHLORIDE 0.9 % IV BOLUS (SEPSIS)
500.0000 mL | Freq: Once | INTRAVENOUS | Status: AC
  Administered 2016-09-10: 500 mL via INTRAVENOUS

## 2016-09-10 MED ORDER — DICYCLOMINE HCL 10 MG PO CAPS
20.0000 mg | ORAL_CAPSULE | Freq: Three times a day (TID) | ORAL | Status: DC | PRN
  Administered 2016-09-10: 20 mg via ORAL
  Filled 2016-09-10: qty 2

## 2016-09-10 MED ORDER — DICYCLOMINE HCL 20 MG PO TABS
20.0000 mg | ORAL_TABLET | Freq: Three times a day (TID) | ORAL | Status: DC | PRN
  Filled 2016-09-10: qty 1

## 2016-09-10 MED ORDER — SODIUM CHLORIDE 0.9% FLUSH
3.0000 mL | Freq: Two times a day (BID) | INTRAVENOUS | Status: DC
  Administered 2016-09-10 – 2016-09-14 (×6): 3 mL via INTRAVENOUS

## 2016-09-10 MED ORDER — SODIUM CHLORIDE 0.9% FLUSH
3.0000 mL | Freq: Two times a day (BID) | INTRAVENOUS | Status: DC
  Administered 2016-09-12 – 2016-09-14 (×4): 3 mL via INTRAVENOUS

## 2016-09-10 MED ORDER — SODIUM CHLORIDE 0.9 % IV SOLN: INTRAVENOUS | Status: DC

## 2016-09-10 MED ORDER — CITALOPRAM HYDROBROMIDE 20 MG PO TABS
40.0000 mg | ORAL_TABLET | Freq: Every day | ORAL | Status: DC
  Administered 2016-09-10 – 2016-09-12 (×3): 40 mg via ORAL
  Filled 2016-09-10 (×3): qty 2

## 2016-09-10 MED ORDER — SODIUM CHLORIDE 0.9% FLUSH: 3.0000 mL | INTRAVENOUS | Status: DC | PRN

## 2016-09-10 MED ORDER — SODIUM CHLORIDE 0.9 % IV SOLN
INTRAVENOUS | Status: AC
  Administered 2016-09-10: 05:00:00 via INTRAVENOUS

## 2016-09-10 MED ORDER — PANTOPRAZOLE SODIUM 40 MG PO TBEC
40.0000 mg | DELAYED_RELEASE_TABLET | Freq: Every day | ORAL | Status: DC
  Administered 2016-09-10 – 2016-09-14 (×5): 40 mg via ORAL
  Filled 2016-09-10 (×5): qty 1

## 2016-09-10 MED ORDER — LEVOTHYROXINE SODIUM 25 MCG PO TABS
25.0000 ug | ORAL_TABLET | Freq: Every day | ORAL | Status: DC
  Administered 2016-09-11 – 2016-09-14 (×4): 25 ug via ORAL
  Filled 2016-09-10 (×5): qty 1

## 2016-09-10 NOTE — Care Management Note (Signed)
Case Management Note  Patient Details  Name: [name] MRN: [id number] Date of Birth: 19-Nov-1940  Subjective/Objective:                  Pt admitted with syncope. She is from home, lives alone and has a granddaughter and neighbors for support. She uses walker with ambulation and has neb and home oxygen at home PTA. She has PCP, transportation to appointments and no difficulty affording medications. She plans to return home at [place]. She is agreeable to [facility]. She has used [facility] in the past and would like to use them again. She is aware [facility] is now known as [facility].  Action/Plan: Pt plans to return home with [facility] serivces.  Vicky, of [facility], is aware of referral and will obtain pt info from Chart.  Will cont to follow.   Expected Discharge Date:      09/11/2016            Expected Discharge Plan:  [place]  In-House Referral:  NA  Discharge planning Services  CM Consult  Post Acute Care Choice:  Home Health Choice offered to:  Patient  HH Arranged:  RN, PT [facility] Agency:  [facility] (know known as [facility])  Status of Service:  In process, will continue to follow  Sherald Barge, RN 09/10/2016, 2:58 PM

## 2016-09-10 NOTE — Care Management Obs Status (Signed)
[facility] NOTIFICATION   Patient Details  Name: ALFREDIA DESANCTIS MRN: [id number] Date of Birth: 1941/03/23   Medicare Observation Status Notification Given:  Yes    Sherald Barge, RN 09/10/2016, 1:46 PM

## 2016-09-10 NOTE — Progress Notes (Signed)
*  PRELIMINARY RESULTS* Echocardiogram 2D Echocardiogram has been performed.  Samuel Germany 09/10/2016, 10:36 AM

## 2016-09-10 NOTE — Evaluation (Signed)
Physical Therapy Evaluation Patient Details Name: [name] MRN: [id number] DOB: July 27, 1941 Today's Date: 09/10/2016   History of Present Illness  75 y.o. female with medical history significant of Copd on home oxygen, AAA, diastolic CHF, HTN comes in after possibly passing out after going to the toilet in the middle of the night. Pt reports when she takes her flexeril at night it makes her very sleepy and loopy.  Tonight she got up to go to the bathroom and she was sitting on the toilet when she could barely stay awake, she got up with her walker as she was afraid to fall and on the way back to her bed she collapsed to the floor.  She is unsure if she passed out, as she was very somunlent and does not remember.  She was able to get some help from her neighbor.  She denies any chest pain, she has chronic back pain, she has been able to ambulate several times since arrival to the ED.  She only takes APAP for her chronic pain at home.  Pt referred for admission for possible syncope.  Thoracic XR shows question of mild new compression deformity of vertebral body T6  Clinical Impression  Pt received in bed, and is agreeable to PT evaluation.  Pt expressed that she is normally modified independent with all functional mobility with the use of a Rollator walker.  Pt states she uses O2 at home.  She does not drive anymore, but has assistance from her family and neighbors to run errands and get to the grocery store.  During today's evaluation, she required supervision for log roll to get out of bed, and Min A for sit<>stand with vc's for correct technique and hand placement.  She was able to ambulate about 9f with RW and Min A, but she is limited due to increased pain.  Discussed d/c planning with pt, and she states that her granddaughter would be able to help out if she needs someone to be with her at home.  Also recommend HHPT upon d/c to continue to increased strength, endurance, and functional mobility.      Follow Up Recommendations Home health PT;Supervision/Assistance - 24 hour;Other (comment) (Pt stated that her granddaughter is a CNA, and may be able to assist with this.)    Equipment Recommendations  None recommended by PT    Recommendations for Other Services       Precautions / Restrictions Precautions Precautions: Fall Precaution Comments: 3-4 falls in the past 6 months.  Spinal Precautions - log roll Restrictions Weight Bearing Restrictions: No      Mobility  Bed Mobility Overal bed mobility: Needs Assistance Bed Mobility: Rolling;Supine to Sit Rolling: Supervision   Supine to sit: Supervision;HOB elevated (vc's for log roll technique. )        Transfers Overall transfer level: Needs assistance Equipment used: Rolling walker (2 wheeled) Transfers: Sit to/from Stand Sit to Stand: Min assist            Ambulation/Gait Ambulation/Gait assistance: Min assist;Min guard Ambulation Distance (Feet): 8 Feet Assistive device: Rolling walker (2 wheeled) Gait Pattern/deviations: Step-to pattern;Trunk flexed     General Gait Details: distance limited due to increased pain today.  RN present during PT tx to administer pain medication.   Stairs            Wheelchair Mobility    Modified Rankin (Stroke Patients Only)       Balance Overall balance assessment: Needs assistance Sitting-balance support: Bilateral  upper extremity supported;Feet supported Sitting balance-Leahy Scale: Good     Standing balance support: Bilateral upper extremity supported Standing balance-Leahy Scale: Fair                               Pertinent Vitals/Pain Pain Assessment: 0-10 Pain Score: 4  Pain Location: Thoracic area Pain Descriptors / Indicators: Constant Pain Intervention(s): Limited activity within patient's tolerance;Monitored during session;Repositioned;RN gave pain meds during session    Home Living   Living Arrangements: Alone Available Help  at Discharge: Available PRN/intermittently (Pt states she has peopel in her neighborhood that assist her. ) Type of Home: Apartment ([place]) Home Access: Stairs to enter   [employer] of Steps: 1 Home Layout: One level Home Equipment: Grab bars - tub/shower;Walker - 2 wheels;Walker - 4 wheels;Cane - single point;Shower seat      Prior Function Level of Independence: Independent with assistive device(s)   Gait / Transfers Assistance Needed: Pt uses rollator for ambulation.    ADL's / Homemaking Assistance Needed: Pt does not have a car anymore. Granddaughter and a few other neighbors assist her with running errands.          Hand Dominance   Dominant Hand: Right    Extremity/Trunk Assessment   Upper Extremity Assessment: Generalized weakness           Lower Extremity Assessment: Generalized weakness         Communication   Communication: No difficulties  Cognition Arousal/Alertness: Awake/alert Behavior During Therapy: WFL for tasks assessed/performed Overall Cognitive Status: Within Functional Limits for tasks assessed                      General Comments      Exercises     Assessment/Plan    PT Assessment Patient needs continued PT services  PT Problem List Decreased strength;Decreased activity tolerance;Decreased balance;Decreased mobility;Decreased knowledge of use of DME;Decreased safety awareness;Decreased knowledge of precautions;Cardiopulmonary status limiting activity;Pain          PT Treatment Interventions DME instruction;Gait training;Functional mobility training;Therapeutic activities;Therapeutic exercise;Balance training;Patient/family education    PT Goals (Current goals can be found in the Care Plan section)  Acute Rehab PT Goals Patient Stated Goal: Pt wants to go home.  PT Goal Formulation: With patient Time For Goal Achievement: 09/17/16 Potential to Achieve Goals: Good    Frequency Min 3X/week    Barriers to discharge Decreased caregiver support      Co-evaluation               End of Session Equipment Utilized During Treatment: Gait belt;Oxygen Activity Tolerance: Patient limited by pain Patient left: in chair;with call bell/phone within reach Nurse Communication: Mobility status Jenny Reichmann, RN notified of pt's location, and mobility status, as well as rec's for log roll due to possible acute compresison fx.  )    Functional Assessment Tool Used: [employer] "6-clicks"  Functional Limitation: Mobility: Walking and moving around Mobility: Walking and Moving Around Current Status [phone]): At least 40 percent but less than 60 percent impaired, limited or restricted Mobility: Walking and Moving Around Goal Status [phone]): At least 20 percent but less than 40 percent impaired, limited or restricted    Time: 1500-1529 PT Time Calculation (min) (ACUTE ONLY): 29 min   Charges:   PT Evaluation $PT Eval Low Complexity: 1 Procedure PT Treatments $Therapeutic Activity: 8-22 mins   PT G Codes:  PT G-Codes **NOT FOR INPATIENT CLASS** Functional Assessment Tool Used: [employer] "6-clicks"  Functional Limitation: Mobility: Walking and moving around Mobility: Walking and Moving Around Current Status [phone]): At least 40 percent but less than 60 percent impaired, limited or restricted Mobility: Walking and Moving Around Goal Status [phone]): At least 20 percent but less than 40 percent impaired, limited or restricted    Beth Nixie Laube, PT, DPT X: [phone]

## 2016-09-10 NOTE — Progress Notes (Signed)
Subjective: Patient admitted this morning, see detailed H&P by Dr. Shanon Brow 75 y.o. female with medical history significant of Copd on home oxygen, AAA, diastolic CHF, HTN comes in after possibly passing out after going to the toilet in the middle of the night. Pt reports when she takes her flexeril at night it makes her very sleepy and loopy.  Tonight she got up to go to the bathroom and she was sitting on the toilet when she could barely stay awake, she got up with her walker as she was afraid to fall and on the way back to her bed she collapsed to the floor.  She is unsure if she passed out, as she was very somunlent and does not remember.  She was able to get some help from her neighbor.  She denies any chest pain, she has chronic back pain, she has been able to ambulate several times since arrival to the ED.  She only takes APAP for her chronic pain at home.  Pt referred for admission for possible syncope.  This morning patient  less drowsy, complains of left-sided rib pain on deep breathing.  Vitals:   09/10/16 0350 09/10/16 0445  BP: 137/89 125/66  Pulse: 76 78  Resp: 17 20  Temp:  97.7 F (36.5 C)      A/P Syncope versus hypersomnolence from polypharmacy Left-sided chest pain  History seems more like hypersomnolence as patient has been on Flexeril, Trileptal, alprazolam, Celexa, Phenergan. Will restart Celexa, Vicodin when necessary for pain We'll check left-sided rib series x-ray    [place] Pager- [phone]

## 2016-09-10 NOTE — ED Provider Notes (Signed)
[facility] DEPT Provider Note   CSN: [id number] Arrival date & time: 09/10/16  0116     History   Chief Complaint Chief Complaint  Patient presents with  . Fall    HPI [name] is a 75 y.o. female.  Patient with history of COPD on home oxygen, lung cancer s/p resection, AAA, chronic back pain presenting with diffuse back pain after having a fall tonight while "sleepwalking".  States she got up to use the bathroom, remembers exiting the bathroom with her walker and the next thing she knows, she was on the floor.  She was able to get up and call her neighbor. Patient believes she was "sleepwalking" because she took Flexeril around 9 PM which she does not take on a regular basis and this has caused sleepwalking before. She denies any dizziness or lightheadedness. She denies any chest pain or shortness of breath. She complains of diffuse back pain and has a hard time determining what is new pain and what is old pain. States her entire back hurts. She denies loss of consciousness but states she did hit her head. She does not take any blood thinners. She has some rib pain with deep breathing. No focal weakness, numbness or tingling. No bowel or bladder incontinence. No tongue biting.   The history is provided by the patient.  Fall  Pertinent negatives include no chest pain, no abdominal pain, no headaches and no shortness of breath.    Past Medical History:  Diagnosis Date  . Adenocarcinoma of lung ([place]) 12/2010   left lung/surg only  . Allergic rhinitis   . 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 Anxiety disorder   . Aortic aneurysm ([place])   . Complication of anesthesia 2011   bloodpressure dropped during colonoscopy, not problems since  . Compression fracture   . COPD (chronic obstructive pulmonary disease) ([place])   . Depression   . Diastolic dysfunction   . Diverticulosis   . Dysphagia   . Emphysema lung ([place]) 11/08/2014  . Hiatal hernia   .  Hypothyroidism   . IBS (irritable bowel syndrome)   . On home O2 12/15/2013   chronic hypoxia  . PUD (peptic ulcer disease)    16yr  . Shortness of breath    with exertion  . Trigeminal neuralgia     Patient Active Problem List   Diagnosis Date Noted  . Hypersomnia 02/16/2016  . Acute bronchitis 02/16/2016  . Exertional dyspnea 12/20/2015  . HLD (hyperlipidemia) 11/23/2014  . Change in bowel habits 11/10/2014  . Abdominal pain, left lower quadrant 11/10/2014  . Emphysema lung ([place] 11/08/2014  . Helicobacter pylori gastritis 07/30/2014  . Acute on chronic respiratory failure with hypoxia ([place] 04/10/2014  . Wrist fracture 04/09/2014  . 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 T12 burst fracture ([place] 01/13/2014  . Constipation 12/14/2013  . Acute respiratory failure with hypoxia ([place] 12/14/2013  . Hyponatremia 12/14/2013  . COPD (chronic obstructive pulmonary disease) ([place] 12/14/2013  . Compression fracture of thoracic vertebra ([place] 12/14/2013  . Hypoxia 12/13/2013  . Compression fracture 12/13/2013  . Diastolic dysfunction   . 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 Dyspepsia 02/11/2012  . Aneurysm ([place] 02/11/2012  . Trigeminal neuralgia   . Epigastric pain 02/21/2011  . Esophageal dysphagia 02/21/2011  . Adenocarcinoma of lung ([place] 12/07/2010  . CONTRAST DYE ALLERGY  12/30/2009    Past Surgical History:  Procedure Laterality Date  . ABDOMINAL HYSTERECTOMY    . BACK SURGERY  January 13, 2014  . CATARACT EXTRACTION    . CATARACT EXTRACTION W/PHACO  09/02/2012   Procedure: CATARACT EXTRACTION PHACO AND INTRAOCULAR LENS PLACEMENT (IOC);  Surgeon: Elta Guadeloupe T. Gershon Crane, MD;  Location: [facility]S;  Service: Ophthalmology;  Laterality: Left;  CDE:10.35  . CHOLECYSTECTOMY    . 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 COLONOSCOPY N/A  11/23/2014   Procedure: COLONOSCOPY;  Surgeon: Danie Binder, MD;  Location: [facility] END[facility] SUITE;  Service: Endoscopy;  Laterality: N/A;  930   . ENDARTERECTOMY Right 08/21/2013   Procedure: RIGHT CAROTID ANEURYSM RESECTION;  Surgeon: Rosetta Posner, MD;  Location: [facility] OR;  Service: Vascular;  Laterality: Right;  . ESOPHAGOGASTRODUODENOSCOPY  11/13/2009   w/dilation to 62m, gastric ulceration (H.Pylori) s/p treatment  . ESOPHAGOGASTRODUODENOSCOPY  11/21/2009   distal esophageal web, gastritis  . ESOPHAGOGASTRODUODENOSCOPY  03/14/12   STKW:IOXBDZHGDin the distal esophagus/Mild gastritis/small HH. + H.pylori, prescribed Pylera. Finished treatment.   . fatty tumor removal from lt groin    . FRACTURE SURGERY Left    hand and left ring finger  . LUNG CANCER SURGERY  12/2010   Left VATS, minithoracotomy, LLL superior segmentectomy  . ORIF WRIST FRACTURE Left 04/09/2014   Procedure: OPEN REDUCTION INTERNAL FIXATION (ORIF) WRIST FRACTURE;  Surgeon: TRenette Butters MD;  [facility]  Service: Orthopedics;  Laterality: Left;  . PERCUTANEOUS PINNING Left 04/09/2014   Procedure: PERCUTANEOUS PINNING EXTREMITY;  Surgeon: TRenette Butters MD;  [place] [facility]  Service: Orthopedics;  Laterality: Left;  . TUBAL LIGATION    . vocal cord surgery  02/06/2011   laryngoscopy with bilateral vocal cord Radiesse injection for vocal cord paralysis  . YAG LASER APPLICATION Left [id number]  Procedure: YAG LASER APPLICATION;  Surgeon: MRutherford Guys MD;  Location: [facility] ORS;  Service: Ophthalmology;  Laterality: Left;    OB History    Gravida Para Term Preterm AB Living   '2 2 2     2   '$ SAB TAB Ectopic Multiple Live Births                   Home Medications    Prior to Admission medications   Medication Sig Start Date End Date Taking? Authorizing Provider  albuterol (PROVENTIL HFA;VENTOLIN HFA) 108 (90 Base) MCG/ACT inhaler Inhale 2 puffs into the lungs every 6 (six) hours as needed for wheezing or shortness of breath.  12/20/15   JOneida MD  albuterol (PROVENTIL) (2.5 MG/3ML) 0.083% nebulizer solution Take 3 mLs (2.5 mg total) by nebulization every 6 (six) hours as needed for wheezing or shortness of breath. 03/01/16   BNat Christen MD  ALPRAZolam (Duanne Moron 0.5 MG tablet Take 1 tablet (0.5 mg total) by mouth 2 (two) times daily as needed for anxiety or sleep. 05/17/16   WSusy Frizzle MD  AMITIZA 24 MCG capsule Take 24 mcg by mouth 2 (two) times daily with a meal.  06/22/16   Historical Provider, MD  calcium citrate-vitamin D (CITRACAL+D) 315-200 MG-UNIT per tablet Take 1 tablet by mouth daily.     Historical Provider, MD  citalopram (CELEXA) 40 MG tablet Take 1 tablet (40 mg total) by mouth at bedtime. 01/19/16   WSusy Frizzle MD  cyclobenzaprine (FLEXERIL) 10 MG tablet Take 1 tablet (10 mg total) by mouth  3 (three) times daily as needed for muscle spasms. 05/24/16   [name], MD  dicyclomine (BENTYL) 20 MG tablet Take 1 tablet (20 mg total) by mouth every 8 (eight) hours as needed for spasms. 06/04/16   [name], MD  Flaxseed, Linseed, (FLAXSEED OIL) OIL Take 1 capsule by mouth daily.    Historical Provider, MD  levothyroxine (SYNTHROID, LEVOTHROID) 25 MCG tablet Take 1 tablet (25 mcg total) by mouth daily before breakfast. 06/04/16   [name], MD  omeprazole (PRILOSEC) 40 MG capsule Take 1 capsule (40 mg total) by mouth daily. 06/14/16   [name], MD  Oxcarbazepine (TRILEPTAL) 300 MG tablet Take 1 tablet (300 mg total) by mouth 2 (two) times daily. 07/12/16   [name], MD  promethazine (PHENERGAN) 25 MG tablet Take 1 tablet (25 mg total) by mouth every 6 (six) hours as needed for nausea or vomiting. 07/02/16   [name], MD  Spacer/Aero-Holding Chambers (AEROCHAMBER MV) inhaler Use as instructed 02/16/16   Rush Landmark, MD  umeclidinium-vilanterol [facility] ELLIPTA) 62.5-25 MCG/INH AEPB Inhale 1 puff into the lungs daily. 05/08/16   [name], MD     Family History Family History  Problem Relation Age of Onset  . Pulmonary embolism Mother   . Heart attack Father   . Hypertension Father   . Liver cancer Sister   . Cancer Sister   . Cancer Brother   . Cancer Daughter   . Colon cancer Neg Hx     Social History Social History  Substance Use Topics  . Smoking status: Former Smoker    Packs/day: 0.50    Years: 20.00    Types: Cigarettes    Quit date: 12/21/2010  . Smokeless tobacco: Never Used     Comment: smoking cessation info given and reviewed   . Alcohol use 0.0 oz/week     Comment: seldom     Allergies   Ciprofloxacin hcl; Iohexol; Prozac [fluoxetine hcl]; and Sulfonamide derivatives   Review of Systems Review of Systems  Constitutional: Negative for activity change, appetite change and fever.  HENT: Negative for congestion and rhinorrhea.   Eyes: Negative for visual disturbance.  Respiratory: Negative for cough, chest tightness and shortness of breath.   Cardiovascular: Negative for chest pain and palpitations.  Gastrointestinal: Negative for abdominal pain, nausea and vomiting.  Genitourinary: Negative for dysuria, vaginal bleeding and vaginal discharge.  Musculoskeletal: Positive for arthralgias, back pain and myalgias. Negative for neck pain.  Neurological: Negative for dizziness, weakness, light-headedness and headaches.   A complete 10 system review of systems was obtained and all systems are negative except as noted in the HPI and PMH.    Physical Exam Updated Vital Signs BP 130/73 (BP Location: Left Arm)   Pulse 76   Temp 97.9 F (36.6 C) (Oral)   Resp 22   Ht '5\' 5"'$  (1.651 m)   Wt 130 lb (59 kg)   SpO2 97%   BMI 21.63 kg/m   Physical Exam  Constitutional: She is oriented to person, place, and time. She appears well-developed and well-nourished. No distress.  uncomfortable  HENT:  Head: Normocephalic and atraumatic.  Mouth/Throat: Oropharynx is clear and moist. No oropharyngeal exudate.   No scalp hematoma or lacerations  Eyes: Conjunctivae and EOM are normal. Pupils are equal, round, and reactive to light.  Neck: Normal range of motion. Neck supple.  No C spine tenderness  Cardiovascular: Normal rate, regular rhythm, normal  heart sounds and intact distal pulses.   No murmur heard. Pulmonary/Chest: Effort normal and breath sounds normal. No respiratory distress. She has no wheezes. She exhibits no tenderness.  Abdominal: Soft. There is no tenderness. There is no rebound and no guarding.  Musculoskeletal: Normal range of motion. She exhibits tenderness. She exhibits no edema.  Well healed TL incision.  Diffuse tenderness down T and L spine. No stepoffs. FROM hips without pain. Pelvis stable. 5/5 strength in bilateral lower extremities. Ankle plantar and dorsiflexion intact. Great toe extension intact bilaterally. +2 DP and PT pulses. +2 patellar reflexes bilaterally.    Neurological: She is alert and oriented to person, place, and time. No cranial nerve deficit. She exhibits normal muscle tone. Coordination normal.   5/5 strength throughout. CN 2-12 intact.Equal grip strength.   Skin: Skin is warm. Capillary refill takes less than 2 seconds.  Psychiatric: She has a normal mood and affect. Her behavior is normal.  Nursing note and vitals reviewed.    ED Treatments / Results  Labs (all labs ordered are listed, but only abnormal results are displayed) Labs Reviewed  CBC WITH DIFFERENTIAL/PLATELET - Abnormal; Notable for the following:       Result Value   Hemoglobin 10.0 (*)    HCT 31.6 (*)    MCV 76.7 (*)    MCH 24.3 (*)    RDW 16.3 (*)    All other components within normal limits  BASIC METABOLIC PANEL - Abnormal; Notable for the following:    Sodium 124 (*)    Chloride 88 (*)    All other components within normal limits  TROPONIN I  URINALYSIS, ROUTINE W REFLEX MICROSCOPIC (NOT AT [facility])  BASIC METABOLIC PANEL  CBC  POC OCCULT BLOOD, ED    EKG  EKG  Interpretation  Date/Time:  Monday September 10 2016 02:00:59 EST Ventricular Rate:  69 PR Interval:    QRS Duration: 111 QT Interval:  458 QTC Calculation: 491 R Axis:   -17 Text Interpretation:  Sinus rhythm Anterior infarct, old Nonspecific T abnormalities, lateral leads No significant change was found Confirmed by Wyvonnia Dusky  MD, Deldrick Linch [phone]) on 09/10/2016 2:07:14 AM       Radiology Dg Chest 2 View  Result Date: 09/10/2016 CLINICAL DATA:  Status post fall backwards, with concern for chest injury. Initial encounter. EXAM: CHEST  2 VIEW COMPARISON:  Chest radiograph performed 02/29/2016, and CT of the chest performed 06/04/2016 FINDINGS: The lungs are well-aerated. Mild bibasilar atelectasis is noted. Known pulmonary nodules are better characterized on prior CT. There is no evidence of pleural effusion or pneumothorax. The heart is mildly enlarged. No acute osseous abnormalities are seen. Thoracolumbar spinal fusion hardware is partially imaged. Multiple chronic compression deformities are noted along the thoracic and upper lumbar spine. IMPRESSION: 1. No displaced rib fracture seen. Multiple chronic compression deformities along the thoracic and upper lumbar spine. 2. Mild bibasilar atelectasis noted. Known pulmonary nodules are better characterized on prior CT. Electronically Signed   By: Garald Balding M.D.   On: 09/10/2016 03:10   Dg Thoracic Spine 2 View  Result Date: 09/10/2016 CLINICAL DATA:  Acute onset of upper back pain, after fall backwards. Initial encounter. EXAM: THORACIC SPINE 2 VIEWS COMPARISON:  Chest radiograph performed 02/29/2016, and CT of the chest performed 06/04/2016 FINDINGS: There is question of mild new compression deformity of vertebral body T6, which could be acute or subacute in nature. Mild compression deformity of T8 is stable in appearance. Compression deformities of T12  and L1 are also unchanged. Underlying thoracolumbar spinal fusion hardware is again noted  extending inferiorly from T10. Intervertebral disc spaces are grossly preserved. The visualized portions of both lungs are clear. The mediastinum is unremarkable in appearance. IMPRESSION: Question of mild new compression deformity of vertebral body T6, which could be acute or subacute in nature. Chronic compression deformities of T8, T12 and L1 are unchanged, with underlying thoracolumbar spinal fusion hardware. Electronically Signed   By: Garald Balding M.D.   On: 09/10/2016 03:13   Dg Lumbar Spine Complete  Result Date: 09/10/2016 CLINICAL DATA:  Acute onset of lower back pain, after fall backwards. Initial encounter. EXAM: LUMBAR SPINE - COMPLETE 4+ VIEW COMPARISON:  Lumbar spine radiographs performed 03/14/2015, and CT of the chest performed 06/04/2016 FINDINGS: Compression deformities of vertebral bodies at T12 and L1 are relatively stable from the prior CT. Mild degenerative change is noted along the lower lumbar spine. Thoracolumbar spinal fusion hardware is again noted at T10-L2. 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 Clips are noted within the right upper quadrant, reflecting prior cholecystectomy. IMPRESSION: 1. No evidence of acute fracture or subluxation along the lumbar spine. 2. Chronic compression deformities of T12 and L1 are relatively stable from the prior CT. Mild degenerative change along the lower lumbar spine. Thoracolumbar spinal fusion hardware at T10-L2. 3. **An incidental finding of potential clinical significance has been found. 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 This has increased in size from 3.4 cm in 2016. Recommend followup by ultrasound in 2 years. This recommendation follows ACR consensus guidelines: White Paper of the ACR Incidental Findings Committee II on Vascular Findings. J Am Coll Radiol 2013;  10:789-794.** Electronically Signed   By: Garald Balding M.D.   On: 09/10/2016 03:17   Dg Pelvis 1-2 Views  Result Date: 09/10/2016 CLINICAL DATA:  Status post fall, with pelvic pain. Initial encounter. EXAM: PELVIS - 1-2 VIEW COMPARISON:  CT of the abdomen and pelvis from 11/19/2014 FINDINGS: There is no evidence of fracture or dislocation. Axial joint space narrowing is noted at the right hip, with mild underlying sclerosis. The left hip joint is unremarkable in appearance. Mild degenerative change is noted at the lower lumbar spine. The sacroiliac joints are grossly unremarkable. The visualized bowel gas pattern is unremarkable in appearance. IMPRESSION: 1. No evidence of fracture or dislocation. 2. Axial joint space narrowing at the right hip, with mild associated sclerosis. 3. Mild degenerative change at the lower lumbar spine. Electronically Signed   By: Garald Balding M.D.   On: 09/10/2016 03:18    Procedures Procedures (including critical care time)  Medications Ordered in ED Medications  HYDROcodone-acetaminophen (NORCO/VICODIN) 5-325 MG per tablet 1 tablet (not administered)     Initial Impression / Assessment and Plan / ED Course  I have reviewed the triage vital signs and the nursing notes.  Pertinent labs & imaging results that were available during my care of the patient were reviewed by me and considered in my medical decision making (see chart for details).  Patient with back pain after fall while "sleepwalking".  She remembers walking out of bathroom. Did not have any dizziness or lightheadedness.  Question episode of syncope.  EKG is unchanged. No Brugada, no prolonged QT. Denies LOC, denies headache. No blood thinner use.   Labs show hyponatremia of 124. Patient states she's had issues with this  in the past and had medications adjustments. X-ray shows stable hardware. Multiple stable compression fractures. Possible new compression fracture at T6. Patient informed of  enlarging aortic aneurysm need for ultrasound follow-up. Hemoglobin slightly decreased. FOBT negative.  Concern for syncope without prodrome. We'll hydrate for hyponatremia Observation admission d/w Dr. Shanon Brow.   Final Clinical Impressions(s) / ED Diagnoses   Final diagnoses:  Syncope, unspecified syncope type  Hyponatremia    New Prescriptions New Prescriptions   No medications on file     Ezequiel Essex, MD 09/10/16 [phone]

## 2016-09-10 NOTE — ED Triage Notes (Signed)
Patient complaining of pain in her back and in her hips.  States that she fell around [employer] while sleep walking.  Got up to have a bowel movement and was sitting on the toilet and fell asleep.  Got up and was walking back to bed with the use of the walker.  Fell asleep walking and fell straight backwards.

## 2016-09-10 NOTE — H&P (Signed)
History and Physical    [name] WCB:[id number] DOB: Feb 01, 1941 DOA: 09/10/2016  PCP: [name], MD  Patient coming from:  home  Chief Complaint:   Passed out  HPI: [name] is a 75 y.o. female with medical history significant of Copd on home oxygen, AAA, diastolic CHF, HTN comes in after possibly passing out after going to the toilet in the middle of the night. Pt reports when she takes her flexeril at night it makes her very sleepy and loopy.  Tonight she got up to go to the bathroom and she was sitting on the toilet when she could barely stay awake, she got up with her walker as she was afraid to fall and on the way back to her bed she collapsed to the floor.  She is unsure if she passed out, as she was very somunlent and does not remember.  She was able to get some help from her neighbor.  She denies any chest pain, she has chronic back pain, she has been able to ambulate several times since arrival to the ED.  She only takes APAP for her chronic pain at home.  Pt referred for admission for possible syncope.  Review of Systems: As per HPI otherwise 10 point review of systems negative.   Past Medical History:  Diagnosis Date  . Adenocarcinoma of lung ([place]) 12/2010   left lung/surg only  . Allergic rhinitis   . 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 Anxiety disorder   . Aortic aneurysm ([place])   . Complication of anesthesia 2011   bloodpressure dropped during colonoscopy, not problems since  . Compression fracture   . COPD (chronic obstructive pulmonary disease) ([place])   . Depression   . Diastolic dysfunction   . Diverticulosis   . Dysphagia   . Emphysema lung ([place]) 11/08/2014  . Hiatal hernia   . Hypothyroidism   . IBS (irritable bowel syndrome)   . On home O2 12/15/2013   chronic hypoxia  . PUD (peptic ulcer disease)    63yr  . Shortness of breath    with exertion  . Trigeminal neuralgia     Past Surgical History:  Procedure  Laterality Date  . ABDOMINAL HYSTERECTOMY    . BACK SURGERY  January 13, 2014  . CATARACT EXTRACTION    . CATARACT EXTRACTION W/PHACO  09/02/2012   Procedure: CATARACT EXTRACTION PHACO AND INTRAOCULAR LENS PLACEMENT (IOC);  Surgeon: MElta GuadeloupeT. SGershon Crane MD;  Location: [facility];  Service: Ophthalmology;  Laterality: Left;  CDE:10.35  . CHOLECYSTECTOMY    . 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 COLONOSCOPY N/A 11/23/2014   Procedure: COLONOSCOPY;  Surgeon: SDanie Binder MD;  Location: [facility];  Service: Endoscopy;  Laterality: N/A;  930   . ENDARTERECTOMY Right 08/21/2013   Procedure: RIGHT CAROTID ANEURYSM RESECTION;  Surgeon: TRosetta Posner MD;  [facility];  Service: Vascular;  Laterality: Right;  . ESOPHAGOGASTRODUODENOSCOPY  11/13/2009   w/dilation to 127m gastric ulceration (H.Pylori) s/p treatment  . ESOPHAGOGASTRODUODENOSCOPY  11/21/2009   distal esophageal web, gastritis  . ESOPHAGOGASTRODUODENOSCOPY  03/14/12   SLOHY:[id number] the distal esophagus/Mild gastritis/small HH. + H.pylori, prescribed Pylera. Finished treatment.   . fatty tumor removal from lt groin    . FRACTURE SURGERY Left    hand and left ring finger  . LUNG CANCER SURGERY  12/2010   Left VATS, minithoracotomy, LLL  superior segmentectomy  . ORIF WRIST FRACTURE Left 04/09/2014   Procedure: OPEN REDUCTION INTERNAL FIXATION (ORIF) WRIST FRACTURE;  Surgeon: Renette Butters, MD;  Location: [facility];  Service: Orthopedics;  Laterality: Left;  . PERCUTANEOUS PINNING Left 04/09/2014   Procedure: PERCUTANEOUS PINNING EXTREMITY;  Surgeon: Renette Butters, MD;  Location: [facility];  Service: Orthopedics;  Laterality: Left;  . TUBAL LIGATION    . vocal cord surgery  02/06/2011   laryngoscopy with bilateral vocal cord Radiesse injection for vocal cord paralysis  . YAG LASER APPLICATION Left 95/18/8416   Procedure: YAG LASER APPLICATION;  Surgeon: Rutherford Guys, MD;  Location: [facility];  Service:  Ophthalmology;  Laterality: Left;     reports that she quit smoking about 5 years ago. Her smoking use included Cigarettes. She has a 10.00 pack-year smoking history. She has never used smokeless tobacco. She reports that she drinks alcohol. She reports that she does not use drugs.  Allergies  Allergen Reactions  . Ciprofloxacin Hcl Hives  . Iohexol      Desc: Per [facility], pt is allergic to IV contrast, no type of reaction was available. Pt cannot remember but first reacted around 15 yrs ago from an IVP. Notes were date from 2009 at urology center., Onset Date: [id number]   . Prozac [Fluoxetine Hcl] Rash  . Sulfonamide Derivatives Hives and Rash    Family History  Problem Relation Age of Onset  . Pulmonary embolism Mother   . Heart attack Father   . Hypertension Father   . Liver cancer Sister   . Cancer Sister   . Cancer Brother   . Cancer Daughter   . Colon cancer Neg Hx     Prior to Admission medications   Medication Sig Start Date End Date Taking? Authorizing Provider  albuterol (PROVENTIL HFA;VENTOLIN HFA) 108 (90 Base) MCG/ACT inhaler Inhale 2 puffs into the lungs every 6 (six) hours as needed for wheezing or shortness of breath. 12/20/15   [place], MD  albuterol (PROVENTIL) (2.5 MG/3ML) 0.083% nebulizer solution Take 3 mLs (2.5 mg total) by nebulization every 6 (six) hours as needed for wheezing or shortness of breath. 03/01/16   Nat Christen, MD  ALPRAZolam Duanne Moron) 0.5 MG tablet Take 1 tablet (0.5 mg total) by mouth 2 (two) times daily as needed for anxiety or sleep. 05/17/16   [name], MD  AMITIZA 24 MCG capsule Take 24 mcg by mouth 2 (two) times daily with a meal.  06/22/16   Historical Provider, MD  calcium citrate-vitamin D (CITRACAL+D) 315-200 MG-UNIT per tablet Take 1 tablet by mouth daily.     Historical Provider, MD  citalopram (CELEXA) 40 MG tablet Take 1 tablet (40 mg total) by mouth at bedtime. 01/19/16   [name], MD    cyclobenzaprine (FLEXERIL) 10 MG tablet Take 1 tablet (10 mg total) by mouth 3 (three) times daily as needed for muscle spasms. 05/24/16   [name], MD  dicyclomine (BENTYL) 20 MG tablet Take 1 tablet (20 mg total) by mouth every 8 (eight) hours as needed for spasms. 06/04/16   [name], MD  Flaxseed, Linseed, (FLAXSEED OIL) OIL Take 1 capsule by mouth daily.    Historical Provider, MD  levothyroxine (SYNTHROID, LEVOTHROID) 25 MCG tablet Take 1 tablet (25 mcg total) by mouth daily before breakfast. 06/04/16   [name], MD  omeprazole (PRILOSEC) 40 MG capsule Take 1 capsule (40 mg total) by mouth daily. 06/14/16  [name], MD  Oxcarbazepine (TRILEPTAL) 300 MG tablet Take 1 tablet (300 mg total) by mouth 2 (two) times daily. 07/12/16   [name], MD  promethazine (PHENERGAN) 25 MG tablet Take 1 tablet (25 mg total) by mouth every 6 (six) hours as needed for nausea or vomiting. 07/02/16   [name], MD  Spacer/Aero-Holding Chambers (AEROCHAMBER MV) inhaler Use as instructed 02/16/16   Rush Landmark, MD  umeclidinium-vilanterol [facility] ELLIPTA) 62.5-25 MCG/INH AEPB Inhale 1 puff into the lungs daily. 05/08/16   [name], MD    Physical Exam: Vitals:   09/10/16 0137 09/10/16 0138 09/10/16 0350  BP:  130/73 137/89  Pulse:  76 76  Resp:  22 17  Temp:  97.9 F (36.6 C)   TempSrc:  Oral   SpO2:  97% 95%  Weight: 59 kg (130 lb)    Height: '5\' 5"'$  (1.651 m)     Constitutional: NAD, calm, comfortable Vitals:   09/10/16 0137 09/10/16 0138 09/10/16 0350  BP:  130/73 137/89  Pulse:  76 76  Resp:  22 17  Temp:  97.9 F (36.6 C)   TempSrc:  Oral   SpO2:  97% 95%  Weight: 59 kg (130 lb)    Height: '5\' 5"'$  (1.651 m)     Eyes: PERRL, lids and conjunctivae normal ENMT: Mucous membranes are moist. Posterior pharynx clear of any exudate or lesions.Normal dentition.  Neck: normal, supple, no masses, no thyromegaly Respiratory: clear to auscultation  bilaterally, no wheezing, no crackles. Normal respiratory effort. No accessory muscle use.  Cardiovascular: Regular rate and rhythm, no murmurs / rubs / gallops. No extremity edema. 2+ pedal pulses. No carotid bruits.  Abdomen: no tenderness, no masses palpated. No hepatosplenomegaly. Bowel sounds positive.  Musculoskeletal: no clubbing / cyanosis. No joint deformity upper and lower extremities. Good ROM, no contractures. Normal muscle tone.  Skin: no rashes, lesions, ulcers. No induration Neurologic: CN 2-12 grossly intact. Sensation intact, DTR normal. Strength 5/5 in all 4.  Psychiatric: Normal judgment and insight. Alert and oriented x 3. Normal mood.    Labs on Admission: I have personally reviewed following labs and imaging studies  CBC:  Recent Labs Lab 09/10/16 0226  WBC 7.5  NEUTROABS 5.7  HGB 10.0*  HCT 31.6*  MCV 76.7*  PLT 932   Basic Metabolic Panel:  Recent Labs Lab 09/10/16 0226  NA 124*  K 4.3  CL 88*  CO2 29  GLUCOSE 98  BUN 7  CREATININE 0.47  CALCIUM 9.4   GFR: Estimated Creatinine Clearance: 54.7 mL/min (by C-G formula based on SCr of 0.47 mg/dL).  Cardiac Enzymes:  Recent Labs Lab 09/10/16 0226  TROPONINI <0.03    Urine analysis:    Component Value Date/Time   COLORURINE YELLOW 06/01/2015 1145   APPEARANCEUR CLEAR 06/01/2015 1145   LABSPEC 1.015 06/01/2015 1145   PHURINE 5.5 06/01/2015 1145   GLUCOSEU NEGATIVE 06/01/2015 1145   HGBUR NEGATIVE 06/01/2015 1145   BILIRUBINUR NEGATIVE 06/01/2015 1145   KETONESUR NEGATIVE 06/01/2015 1145   PROTEINUR NEGATIVE 06/01/2015 1145   UROBILINOGEN 0.2 06/01/2015 1145   NITRITE NEGATIVE 06/01/2015 1145   LEUKOCYTESUR NEGATIVE 06/01/2015 1145     Radiological Exams on Admission: Dg Chest 2 View  Result Date: 09/10/2016 CLINICAL DATA:  Status post fall backwards, with concern for chest injury. Initial encounter. EXAM: CHEST  2 VIEW COMPARISON:  Chest radiograph performed 02/29/2016, and CT of  the chest performed 06/04/2016 FINDINGS: The  lungs are well-aerated. Mild bibasilar atelectasis is noted. Known pulmonary nodules are better characterized on prior CT. There is no evidence of pleural effusion or pneumothorax. The heart is mildly enlarged. No acute osseous abnormalities are seen. Thoracolumbar spinal fusion hardware is partially imaged. Multiple chronic compression deformities are noted along the thoracic and upper lumbar spine. IMPRESSION: 1. No displaced rib fracture seen. Multiple chronic compression deformities along the thoracic and upper lumbar spine. 2. Mild bibasilar atelectasis noted. Known pulmonary nodules are better characterized on prior CT. Electronically Signed   By: Garald Balding M.D.   On: 09/10/2016 03:10   Dg Thoracic Spine 2 View  Result Date: 09/10/2016 CLINICAL DATA:  Acute onset of upper back pain, after fall backwards. Initial encounter. EXAM: THORACIC SPINE 2 VIEWS COMPARISON:  Chest radiograph performed 02/29/2016, and CT of the chest performed 06/04/2016 FINDINGS: There is question of mild new compression deformity of vertebral body T6, which could be acute or subacute in nature. Mild compression deformity of T8 is stable in appearance. Compression deformities of T12 and L1 are also unchanged. Underlying thoracolumbar spinal fusion hardware is again noted extending inferiorly from T10. Intervertebral disc spaces are grossly preserved. The visualized portions of both lungs are clear. The mediastinum is unremarkable in appearance. IMPRESSION: Question of mild new compression deformity of vertebral body T6, which could be acute or subacute in nature. Chronic compression deformities of T8, T12 and L1 are unchanged, with underlying thoracolumbar spinal fusion hardware. Electronically Signed   By: Garald Balding M.D.   On: 09/10/2016 03:13   Dg Lumbar Spine Complete  Result Date: 09/10/2016 CLINICAL DATA:  Acute onset of lower back pain, after fall backwards. Initial  encounter. EXAM: LUMBAR SPINE - COMPLETE 4+ VIEW COMPARISON:  Lumbar spine radiographs performed 03/14/2015, and CT of the chest performed 06/04/2016 FINDINGS: Compression deformities of vertebral bodies at T12 and L1 are relatively stable from the prior CT. Mild degenerative change is noted along the lower lumbar spine. Thoracolumbar spinal fusion hardware is again noted at T10-L2. 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 Clips are noted within the right upper quadrant, reflecting prior cholecystectomy. IMPRESSION: 1. No evidence of acute fracture or subluxation along the lumbar spine. 2. Chronic compression deformities of T12 and L1 are relatively stable from the prior CT. Mild degenerative change along the lower lumbar spine. Thoracolumbar spinal fusion hardware at T10-L2. 3. **An incidental finding of potential clinical significance has been found. 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 This has increased in size from 3.4 cm in 2016. Recommend followup by ultrasound in 2 years. This recommendation follows ACR consensus guidelines: White Paper of the ACR Incidental Findings Committee II on Vascular Findings. J Am Coll Radiol 2013; 10:789-794.** Electronically Signed   By: Garald Balding M.D.   On: 09/10/2016 03:17   Dg Pelvis 1-2 Views  Result Date: 09/10/2016 CLINICAL DATA:  Status post fall, with pelvic pain. Initial encounter. EXAM: PELVIS - 1-2 VIEW COMPARISON:  CT of the abdomen and pelvis from 11/19/2014 FINDINGS: There is no evidence of fracture or dislocation. Axial joint space narrowing is noted at the right hip, with mild underlying sclerosis. The left hip joint is unremarkable in appearance. Mild degenerative change is noted at the lower lumbar spine. The sacroiliac joints are grossly unremarkable. The visualized bowel gas pattern is  unremarkable in appearance. IMPRESSION: 1. No evidence  of fracture or dislocation. 2. Axial joint space narrowing at the right hip, with mild associated sclerosis. 3. Mild degenerative change at the lower lumbar spine. Electronically Signed   By: Garald Balding M.D.   On: 09/10/2016 03:18    EKG: Independently reviewed.  nsr no acute issues   Assessment/Plan 75 yo female with multiple medical issues comes in after possible syncopal episode /fall  Principal Problem:   Syncope possible - sounds like per history this is due to medication affect.  Pt not orthostatic.  ekg nsr.  Trop neg.  She has not had an echo in years here, will order echo but this is probably low yield.  Pt with no focal neurological deficits now, so will not proceed with any neuro imaging.  Cont on tele monitoring tonight.  She has ambulated several times here without any difficulty with resolution of her symptoms by this time.    Active Problems:   Adenocarcinoma of lung ([place])- noted   Aneurysm ([place])- noted and stable   Diastolic dysfunction- compensated and stable at this time   Hyponatremia- check ua, provide some gentle ivf overnight.   COPD (chronic obstructive pulmonary disease) ([place])- noted, stable   Compression fracture of thoracic vertebra ([place])- may be one that is new, pt not having a significant amount of pain at this time   clarify home meds  DVT prophylaxis: scds  Code Status:  full Family Communication: none  Disposition Plan: per day team Consults called:  none Admission status:  observation   Ambur Province A MD [facility]  If 7PM-7AM, please contact night-coverage [email or web address] Password [email or web address]  09/10/2016, 4:32 AM

## 2016-09-11 ENCOUNTER — Observation Stay (HOSPITAL_COMMUNITY): Payer: PPO

## 2016-09-11 ENCOUNTER — Ambulatory Visit: Payer: PPO | Admitting: Family Medicine

## 2016-09-11 ENCOUNTER — Telehealth: Payer: Self-pay | Admitting: Family Medicine

## 2016-09-11 DIAGNOSIS — F329 Major depressive disorder, single episode, unspecified: Secondary | ICD-10-CM | POA: Diagnosis not present

## 2016-09-11 DIAGNOSIS — J441 Chronic obstructive pulmonary disease with (acute) exacerbation: Secondary | ICD-10-CM | POA: Diagnosis not present

## 2016-09-11 DIAGNOSIS — E039 Hypothyroidism, unspecified: Secondary | ICD-10-CM | POA: Diagnosis not present

## 2016-09-11 DIAGNOSIS — R51 Headache: Secondary | ICD-10-CM | POA: Diagnosis not present

## 2016-09-11 DIAGNOSIS — W06XXXA Fall from bed, initial encounter: Secondary | ICD-10-CM | POA: Diagnosis not present

## 2016-09-11 DIAGNOSIS — Z9981 Dependence on supplemental oxygen: Secondary | ICD-10-CM | POA: Diagnosis not present

## 2016-09-11 DIAGNOSIS — J438 Other emphysema: Secondary | ICD-10-CM | POA: Diagnosis not present

## 2016-09-11 DIAGNOSIS — J209 Acute bronchitis, unspecified: Secondary | ICD-10-CM | POA: Diagnosis not present

## 2016-09-11 DIAGNOSIS — I729 Aneurysm of unspecified site: Secondary | ICD-10-CM | POA: Diagnosis not present

## 2016-09-11 DIAGNOSIS — M545 Low back pain: Secondary | ICD-10-CM | POA: Diagnosis not present

## 2016-09-11 DIAGNOSIS — Z8249 Family history of ischemic heart disease and other diseases of the circulatory system: Secondary | ICD-10-CM | POA: Diagnosis not present

## 2016-09-11 DIAGNOSIS — J449 Chronic obstructive pulmonary disease, unspecified: Secondary | ICD-10-CM | POA: Diagnosis not present

## 2016-09-11 DIAGNOSIS — Z85118 Personal history of other malignant neoplasm of bronchus and lung: Secondary | ICD-10-CM | POA: Diagnosis not present

## 2016-09-11 DIAGNOSIS — I110 Hypertensive heart disease with heart failure: Secondary | ICD-10-CM | POA: Diagnosis not present

## 2016-09-11 DIAGNOSIS — J439 Emphysema, unspecified: Secondary | ICD-10-CM | POA: Diagnosis not present

## 2016-09-11 DIAGNOSIS — Z79899 Other long term (current) drug therapy: Secondary | ICD-10-CM | POA: Diagnosis not present

## 2016-09-11 DIAGNOSIS — G8929 Other chronic pain: Secondary | ICD-10-CM | POA: Diagnosis not present

## 2016-09-11 DIAGNOSIS — Z87891 Personal history of nicotine dependence: Secondary | ICD-10-CM | POA: Diagnosis not present

## 2016-09-11 DIAGNOSIS — K589 Irritable bowel syndrome without diarrhea: Secondary | ICD-10-CM | POA: Diagnosis not present

## 2016-09-11 DIAGNOSIS — I5032 Chronic diastolic (congestive) heart failure: Secondary | ICD-10-CM | POA: Diagnosis not present

## 2016-09-11 DIAGNOSIS — E871 Hypo-osmolality and hyponatremia: Secondary | ICD-10-CM | POA: Diagnosis not present

## 2016-09-11 DIAGNOSIS — C3492 Malignant neoplasm of unspecified part of left bronchus or lung: Secondary | ICD-10-CM | POA: Diagnosis not present

## 2016-09-11 DIAGNOSIS — I714 Abdominal aortic aneurysm, without rupture: Secondary | ICD-10-CM | POA: Diagnosis not present

## 2016-09-11 DIAGNOSIS — R55 Syncope and collapse: Secondary | ICD-10-CM | POA: Diagnosis not present

## 2016-09-11 DIAGNOSIS — M4854XA Collapsed vertebra, not elsewhere classified, thoracic region, initial encounter for fracture: Secondary | ICD-10-CM | POA: Diagnosis not present

## 2016-09-11 DIAGNOSIS — J9611 Chronic respiratory failure with hypoxia: Secondary | ICD-10-CM | POA: Diagnosis not present

## 2016-09-11 DIAGNOSIS — F513 Sleepwalking [somnambulism]: Secondary | ICD-10-CM | POA: Diagnosis not present

## 2016-09-11 DIAGNOSIS — G4710 Hypersomnia, unspecified: Secondary | ICD-10-CM | POA: Diagnosis not present

## 2016-09-11 DIAGNOSIS — J440 Chronic obstructive pulmonary disease with acute lower respiratory infection: Secondary | ICD-10-CM | POA: Diagnosis not present

## 2016-09-11 DIAGNOSIS — M6281 Muscle weakness (generalized): Secondary | ICD-10-CM | POA: Diagnosis not present

## 2016-09-11 DIAGNOSIS — Z9049 Acquired absence of other specified parts of digestive tract: Secondary | ICD-10-CM | POA: Diagnosis not present

## 2016-09-11 DIAGNOSIS — F419 Anxiety disorder, unspecified: Secondary | ICD-10-CM | POA: Diagnosis not present

## 2016-09-11 LAB — GLUCOSE, CAPILLARY: Glucose-Capillary: 109 mg/dL — ABNORMAL HIGH (ref 65–99)

## 2016-09-11 MED ORDER — GUAIFENESIN ER 600 MG PO TB12
1200.0000 mg | ORAL_TABLET | Freq: Two times a day (BID) | ORAL | Status: DC
  Administered 2016-09-11 – 2016-09-14 (×7): 1200 mg via ORAL
  Filled 2016-09-11 (×7): qty 2

## 2016-09-11 MED ORDER — ONDANSETRON HCL 4 MG/2ML IJ SOLN
4.0000 mg | Freq: Four times a day (QID) | INTRAMUSCULAR | Status: DC | PRN
  Administered 2016-09-11 – 2016-09-12 (×5): 4 mg via INTRAVENOUS
  Filled 2016-09-11 (×5): qty 2

## 2016-09-11 MED ORDER — IPRATROPIUM-ALBUTEROL 0.5-2.5 (3) MG/3ML IN SOLN
3.0000 mL | Freq: Four times a day (QID) | RESPIRATORY_TRACT | Status: DC
  Administered 2016-09-11: 3 mL via RESPIRATORY_TRACT
  Filled 2016-09-11 (×2): qty 3

## 2016-09-11 MED ORDER — METHYLPREDNISOLONE SODIUM SUCC 125 MG IJ SOLR
60.0000 mg | Freq: Four times a day (QID) | INTRAMUSCULAR | Status: DC
  Administered 2016-09-11 – 2016-09-13 (×9): 60 mg via INTRAVENOUS
  Filled 2016-09-11 (×9): qty 2

## 2016-09-11 MED ORDER — ALPRAZOLAM 0.5 MG PO TABS
0.5000 mg | ORAL_TABLET | Freq: Two times a day (BID) | ORAL | Status: DC | PRN
  Administered 2016-09-11 – 2016-09-13 (×2): 0.5 mg via ORAL
  Filled 2016-09-11 (×2): qty 1

## 2016-09-11 MED ORDER — ENOXAPARIN SODIUM 40 MG/0.4ML ~~LOC~~ SOLN
40.0000 mg | SUBCUTANEOUS | Status: DC
  Administered 2016-09-11 – 2016-09-13 (×3): 40 mg via SUBCUTANEOUS
  Filled 2016-09-11 (×3): qty 0.4

## 2016-09-11 NOTE — Progress Notes (Addendum)
[facility]  PROGRESS NOTE  [name] OZH:[id number] DOB: 1941-04-14 DOA: 09/10/2016 PCP: [name], MD   Brief HPI:   *75 y.o.femalewith medical history significant of Copd on home oxygen, AAA, diastolic CHF, HTN comes in after possiblypassing out after going to the toilet in the middle of the night. Pt reports when she takes her flexeril at night it makes her very sleepy and loopy. Tonight she got up to go to the bathroom and she was sitting on the toilet when she could barely stay awake, she got up with her walker as she was afraid to fall and on the way back to her bed she collapsed to the floor. She is unsure if she passed out, as she was very somunlent and does not remember. She was able to get some help from her neighbor. She denies any chest pain, she has chronic back pain, she has been able to ambulate several times since arrival to the ED. She only takes APAP for her chronic pain at home. Pt referred for admission for possible syncope.    Subjective   Patient feels more alert this morning, but started having cough with yellow phlegm. Also had one episode of vomiting last night. Rib series x-rays was negative for fracture   Assessment/Plan:     1. Syncope versus mechanical fall from hypersomnolence- patient was on multiple medications at home which was causing hypersomnolence. She was on Flexeril, Trileptal, alprazolam, Celexa, Phenergan. We restarted Celexa and Vicodin for pain. No further episodes of falls in the hospital. Telemetry showing normal sinus rhythm 2. Hyponatremia- sodium was 124 improved to 127. Will check BMP in a.m. 3. Acute bronchitis/COPD exacerbation- patient coughing up yellow colored phlegm, will start DuoNeb's, salmeterol, Mucinex 4. Headache- patient complains of headache, hit her head on the floor at home. CT head obtained today was negative for acute abnormality. 5. Left side chest pain- pain started after fall at home rib series  x-ray showed no acute fracture. Continue Vicodin when necessary for left-sided chest pain.     DVT prophylaxis: Lovenox  Code Status: Full code  Family Communication: Discussed with patient   Disposition Plan: Home when medically stable   Consultants:  None  Procedures:  None  Continuous infusions  KVO    Antibiotics:   Anti-infectives    None       Objective   Vitals:   09/10/16 2200 09/11/16 0200 09/11/16 0600 09/11/16 1433  BP: 128/62 120/68 101/67 125/72  Pulse: 82 83 96 89  Resp: '20 20 20 16  '$ Temp: 98 F (36.7 C) 97.9 F (36.6 C) 98.4 F (36.9 C) 98 F (36.7 C)  TempSrc: Oral Oral Oral Oral  SpO2: 97% 95% 92% 96%  Weight:   63.4 kg (139 lb 12.4 oz)   Height:        Intake/Output Summary (Last 24 hours) at 09/11/16 1602 Last data filed at 09/11/16 0300  Gross per 24 hour  Intake                3 ml  Output             2375 ml  Net            -2372 ml   Filed Weights   09/10/16 0137 09/10/16 0445 09/11/16 0600  Weight: 59 kg (130 lb) 62.8 kg (138 lb 7.2 oz) 63.4 kg (139 lb 12.4 oz)     Physical Examination:  General exam: Appears calm and comfortable. Respiratory  system: Bilateral rhonchi on auscultation Cardiovascular system:  RRR. No  murmurs, rubs, gallops. No pedal edema. GI system: Abdomen is nondistended, soft and nontender. No organomegaly.  Central nervous system. No focal neurological deficits. 5 x 5 power in all extremities. Skin: No rashes, lesions or ulcers. Psychiatry: Alert, oriented x 3.Judgement and insight appear normal. Affect normal.    Data Reviewed: I have personally reviewed following labs and imaging studies  CBG:  Recent Labs Lab 09/10/16 0755 09/11/16 0812  GLUCAP 86 109*    CBC:  Recent Labs Lab 09/10/16 0226 09/10/16 0602  WBC 7.5 9.1  NEUTROABS 5.7  --   HGB 10.0* 9.6*  HCT 31.6* 30.7*  MCV 76.7* 75.8*  PLT 393 461*    Basic Metabolic Panel:  Recent Labs Lab 09/10/16 0226  09/10/16 0602  NA 124* 127*  K 4.3 4.0  CL 88* 93*  CO2 29 27  GLUCOSE 98 96  BUN 7 6  CREATININE 0.47 0.39*  CALCIUM 9.4 8.8*    No results found for this or any previous visit (from the past 240 hour(s)).   Liver Function Tests: No results for input(s): AST, ALT, ALKPHOS, BILITOT, PROT, ALBUMIN in the last 168 hours. No results for input(s): LIPASE, AMYLASE in the last 168 hours. No results for input(s): AMMONIA in the last 168 hours.  Cardiac Enzymes:  Recent Labs Lab 09/10/16 0226  TROPONINI <0.03      Studies: Dg Chest 2 View  Result Date: 09/10/2016 CLINICAL DATA:  Status post fall backwards, with concern for chest injury. Initial encounter. EXAM: CHEST  2 VIEW COMPARISON:  Chest radiograph performed 02/29/2016, and CT of the chest performed 06/04/2016 FINDINGS: The lungs are well-aerated. Mild bibasilar atelectasis is noted. Known pulmonary nodules are better characterized on prior CT. There is no evidence of pleural effusion or pneumothorax. The heart is mildly enlarged. No acute osseous abnormalities are seen. Thoracolumbar spinal fusion hardware is partially imaged. Multiple chronic compression deformities are noted along the thoracic and upper lumbar spine. IMPRESSION: 1. No displaced rib fracture seen. Multiple chronic compression deformities along the thoracic and upper lumbar spine. 2. Mild bibasilar atelectasis noted. Known pulmonary nodules are better characterized on prior CT. Electronically Signed   By: Garald Balding M.D.   On: 09/10/2016 03:10   Dg Ribs Unilateral Left  Result Date: 09/10/2016 CLINICAL DATA:  Fall from bed this morning with left-sided chest pain, initial encounter EXAM: LEFT RIBS - 2 VIEW COMPARISON:  None. FINDINGS: No fracture or other bone lesions are seen involving the ribs. Bibasilar atelectatic changes are again seen. IMPRESSION: No acute rib abnormality noted. Electronically Signed   By: Inez Catalina M.D.   On: 09/10/2016 11:03   Dg  Thoracic Spine 2 View  Result Date: 09/10/2016 CLINICAL DATA:  Acute onset of upper back pain, after fall backwards. Initial encounter. EXAM: THORACIC SPINE 2 VIEWS COMPARISON:  Chest radiograph performed 02/29/2016, and CT of the chest performed 06/04/2016 FINDINGS: There is question of mild new compression deformity of vertebral body T6, which could be acute or subacute in nature. Mild compression deformity of T8 is stable in appearance. Compression deformities of T12 and L1 are also unchanged. Underlying thoracolumbar spinal fusion hardware is again noted extending inferiorly from T10. Intervertebral disc spaces are grossly preserved. The visualized portions of both lungs are clear. The mediastinum is unremarkable in appearance. IMPRESSION: Question of mild new compression deformity of vertebral body T6, which could be acute or subacute in nature. Chronic compression  deformities of T8, T12 and L1 are unchanged, with underlying thoracolumbar spinal fusion hardware. Electronically Signed   By: Garald Balding M.D.   On: 09/10/2016 03:13   Dg Lumbar Spine Complete  Result Date: 09/10/2016 CLINICAL DATA:  Acute onset of lower back pain, after fall backwards. Initial encounter. EXAM: LUMBAR SPINE - COMPLETE 4+ VIEW COMPARISON:  Lumbar spine radiographs performed 03/14/2015, and CT of the chest performed 06/04/2016 FINDINGS: Compression deformities of vertebral bodies at T12 and L1 are relatively stable from the prior CT. Mild degenerative change is noted along the lower lumbar spine. Thoracolumbar spinal fusion hardware is again noted at T10-L2. 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 Clips are noted within the right upper quadrant, reflecting prior cholecystectomy. IMPRESSION: 1. No evidence of acute fracture or subluxation along the lumbar spine. 2. Chronic compression deformities of T12 and L1 are relatively stable from the  prior CT. Mild degenerative change along the lower lumbar spine. Thoracolumbar spinal fusion hardware at T10-L2. 3. **An incidental finding of potential clinical significance has been found. 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 This has increased in size from 3.4 cm in 2016. Recommend followup by ultrasound in 2 years. This recommendation follows ACR consensus guidelines: White Paper of the ACR Incidental Findings Committee II on Vascular Findings. J Am Coll Radiol 2013; 10:789-794.** Electronically Signed   By: Garald Balding M.D.   On: 09/10/2016 03:17   Dg Pelvis 1-2 Views  Result Date: 09/10/2016 CLINICAL DATA:  Status post fall, with pelvic pain. Initial encounter. EXAM: PELVIS - 1-2 VIEW COMPARISON:  CT of the abdomen and pelvis from 11/19/2014 FINDINGS: There is no evidence of fracture or dislocation. Axial joint space narrowing is noted at the right hip, with mild underlying sclerosis. The left hip joint is unremarkable in appearance. Mild degenerative change is noted at the lower lumbar spine. The sacroiliac joints are grossly unremarkable. The visualized bowel gas pattern is unremarkable in appearance. IMPRESSION: 1. No evidence of fracture or dislocation. 2. Axial joint space narrowing at the right hip, with mild associated sclerosis. 3. Mild degenerative change at the lower lumbar spine. Electronically Signed   By: Garald Balding M.D.   On: 09/10/2016 03:18   Ct Head Wo Contrast  Result Date: 09/11/2016 CLINICAL DATA:  Headache and nausea. EXAM: CT HEAD WITHOUT CONTRAST TECHNIQUE: Contiguous axial images were obtained from the base of the skull through the vertex without intravenous contrast. COMPARISON:  04/09/2014 FINDINGS: Brain: No evidence of acute infarction, hemorrhage, hydrocephalus, extra-axial collection or mass lesion/mass effect. Vascular: No hyperdense vessel or unexpected calcification. Skull: Normal.  Negative for fracture or focal lesion. Sinuses/Orbits: No acute finding. Other: None. IMPRESSION: 1. No acute intracranial abnormalities.  Normal brain. Electronically Signed   By: Kerby Moors M.D.   On: 09/11/2016 11:29    Scheduled Meds: . citalopram  40 mg Oral QHS  . guaiFENesin  1,200 mg Oral BID  . ipratropium-albuterol  3 mL Nebulization Q6H  . levothyroxine  25 mcg Oral QAC breakfast  . methylPREDNISolone (SOLU-MEDROL) injection  60 mg Intravenous Q6H  . pantoprazole  40 mg Oral Daily  . sodium chloride flush  3 mL Intravenous Q12H  . sodium chloride flush  3 mL Intravenous Q12H      Time spent: 25 min  [facility] Pager [phone]. If 7PM-7AM, please contact  night-coverage at [email or web address], Office  [phone]  password [email or web address] 09/11/2016, 4:02 PM  LOS: 0 days

## 2016-09-11 NOTE — Telephone Encounter (Signed)
rec'd approval from [place]  # [id number] for  G0299 - [facility]/Hospice on Rn ea 15 min  1 visit 09/12/16-11/10/16  G0151 -  hhcp-Serv of tp, ea 15 min  1 visit  09/12/16-2/3-18  Dx J44.9 COPD, I50.30 CHF and I10 HTN

## 2016-09-12 ENCOUNTER — Encounter: Payer: Self-pay | Admitting: Family Medicine

## 2016-09-12 DIAGNOSIS — I729 Aneurysm of unspecified site: Secondary | ICD-10-CM

## 2016-09-12 LAB — CBC
HCT: 34.4 % — ABNORMAL LOW (ref 36.0–46.0)
Hemoglobin: 10.8 g/dL — ABNORMAL LOW (ref 12.0–15.0)
MCH: 24.3 pg — ABNORMAL LOW (ref 26.0–34.0)
MCHC: 31.4 g/dL (ref 30.0–36.0)
MCV: 77.5 fL — ABNORMAL LOW (ref 78.0–100.0)
Platelets: 455 10*3/uL — ABNORMAL HIGH (ref 150–400)
RBC: 4.44 MIL/uL (ref 3.87–5.11)
RDW: 16.8 % — ABNORMAL HIGH (ref 11.5–15.5)
WBC: 13 10*3/uL — ABNORMAL HIGH (ref 4.0–10.5)

## 2016-09-12 LAB — BASIC METABOLIC PANEL
Anion gap: 8 (ref 5–15)
BUN: 11 mg/dL (ref 6–20)
CO2: 31 mmol/L (ref 22–32)
Calcium: 9.6 mg/dL (ref 8.9–10.3)
Chloride: 91 mmol/L — ABNORMAL LOW (ref 101–111)
Creatinine, Ser: 0.39 mg/dL — ABNORMAL LOW (ref 0.44–1.00)
GFR calc Af Amer: >60 mL/min (ref >60)
GFR calc non Af Amer: >60 mL/min (ref >60)
Glucose, Bld: 140 mg/dL — ABNORMAL HIGH (ref 65–99)
Potassium: 4.1 mmol/L (ref 3.5–5.1)
Sodium: 130 mmol/L — ABNORMAL LOW (ref 135–145)

## 2016-09-12 LAB — GLUCOSE, CAPILLARY: Glucose-Capillary: 140 mg/dL — ABNORMAL HIGH (ref 65–99)

## 2016-09-12 MED ORDER — DOCUSATE SODIUM 100 MG PO CAPS
100.0000 mg | ORAL_CAPSULE | Freq: Every day | ORAL | Status: DC
  Administered 2016-09-12 – 2016-09-14 (×3): 100 mg via ORAL
  Filled 2016-09-12 (×3): qty 1

## 2016-09-12 MED ORDER — SENNA 8.6 MG PO TABS
1.0000 | ORAL_TABLET | Freq: Every day | ORAL | Status: DC
  Administered 2016-09-12 – 2016-09-14 (×3): 8.6 mg via ORAL
  Filled 2016-09-12 (×3): qty 1

## 2016-09-12 MED ORDER — IPRATROPIUM-ALBUTEROL 0.5-2.5 (3) MG/3ML IN SOLN
3.0000 mL | Freq: Four times a day (QID) | RESPIRATORY_TRACT | Status: DC
  Administered 2016-09-12 – 2016-09-14 (×9): 3 mL via RESPIRATORY_TRACT
  Filled 2016-09-12 (×10): qty 3

## 2016-09-12 MED ORDER — IPRATROPIUM-ALBUTEROL 0.5-2.5 (3) MG/3ML IN SOLN
3.0000 mL | RESPIRATORY_TRACT | Status: DC | PRN
  Administered 2016-09-12: 3 mL via RESPIRATORY_TRACT
  Filled 2016-09-12: qty 3

## 2016-09-12 NOTE — Care Management (Signed)
CM met with pt today and discussed plan to return home with [facility] service's. Pt discussed not having 24/7 supervision but will have neighbors to check in on her often and she can call them if needed. Pt encouraged to get up and ambulate while here in hospital to better be prepared for DC home. CM will cont to follow.

## 2016-09-12 NOTE — Progress Notes (Signed)
PT Cancellation Note  Patient Details Name: [name] MRN: [id number] DOB: 1940-12-17   Cancelled Treatment:    Reason Eval/Treat Not Completed: Fatigue/lethargy limiting ability to participate (Pt stated she has already been on walk with nurse tech and sat up for 2 hours earlier, c/o nausea following lunch.  RN aware)  Attempted PT session.  Pt laying supine in bed stated she feels nauseous currently following lunch.  Pt stated she had been on walk earlier with nurse tech and has sat up for 2 hours earlier.  Did not wish to participate with therapy currently due to nausea and fatigue.  [address] LPTA; [place]  Aldona Lento 09/12/2016, 2:05 PM

## 2016-09-12 NOTE — Progress Notes (Signed)
PROGRESS NOTE    [name]  XJO:[id number] DOB: 24-Sep-1941 DOA: 09/10/2016 PCP: [name], MD   Brief Narrative:  75 y.o.femalewith medical history significant of Copd on home oxygen, AAA, diastolic CHF, HTN comes in after possiblypassing out after going to the toilet in the middle of the night. Pt reports when she takes her flexeril at night it makes her very sleepy and loopy. Tonight she got up to go to the bathroom and she was sitting on the toilet when she could barely stay awake, she got up with her walker as she was afraid to fall and on the way back to her bed she collapsed to the floor. She is unsure if she passed out, as she was very somunlent and does not remember. She was able to get some help from her neighbor. She denies any chest pain, she has chronic back pain, she has been able to ambulate several times since arrival to the ED. She only takes APAP for her chronic pain at home. Pt referred for admission for possible syncope.   Assessment & Plan:   Principal Problem:   Syncope Active Problems:   Adenocarcinoma of lung (HCC)   Aneurysm (HCC)   Diastolic dysfunction   Hyponatremia   COPD (chronic obstructive pulmonary disease) (HCC)   Compression fracture of thoracic vertebra (HCC)   Rib pain on left side  Syncope versus mechanical fall from hypersomnolence - patient was on multiple medications at home which was causing hypersomnolence - previously was on Flexeril, Trileptal, alprazolam, Celexa, Phenergan - restarted Celexa and Vicodin for pain - No further episodes of falls in the hospital - Telemetry showing normal sinus rhythm but upon review showing few PVC's and missed beats  Hyponatremia - sodium 124>>127>>130 - Will check BMP in a.m  Acute bronchitis/COPD exacerbation - patient coughing up yellow colored phlegm - will start DuoNeb's, salmeterol, Mucinex - patient states breathing has improved - on 3L [place] at home- currently on  3.5L  Headache - patient complains of headache - hit her head on the floor at home - CT head obtained today was negative for acute abnormality - no headache noted today  Left side chest pain - pain started after fall at home rib series x-ray showed no acute fracture - Continue Vicodin when necessary for left-sided chest pain.   DVT prophylaxis: Lovenox Code Status: Full code Family Communication: Discussed with patient Disposition Plan: likely discharge tomorrow   Consultants:   None  Procedures:   None  Antimicrobials:   None    Subjective: Patient discussed at length with me her medical history.  Has had a great deal of stress recently concerning her best friends death.  Says she is worried about her own health.  Has not had a syncopal episode since admission.  Still feels very weak; has history of hyponatremia.  Unclear if she has any cardiac history- was told once she had early heartbeats.  Objective: Vitals:   09/12/16 1005 09/12/16 1157 09/12/16 1410 09/12/16 1502  BP: 102/64  112/69   Pulse: 95  92   Resp: 16  16   Temp: 97.5 F (36.4 C)  97.4 F (36.3 C)   TempSrc: Oral  Oral   SpO2: 94% 90% 96% 90%  Weight:      Height:        Intake/Output Summary (Last 24 hours) at 09/12/16 1549 Last data filed at 09/12/16 1300  Gross per 24 hour  Intake  380 ml  Output             1450 ml  Net            -1070 ml   Filed Weights   09/10/16 0445 09/11/16 0600 09/12/16 0600  Weight: 62.8 kg (138 lb 7.2 oz) 63.4 kg (139 lb 12.4 oz) 63.1 kg (139 lb 1.8 oz)   Telemetry: frequent PVC's  Examination:  General exam: Appears calm and comfortable  Respiratory system: Respiratory effort normal. Faint wheezing on exhalation, soft breath sounds Cardiovascular system: S1 & S2 heard, RRR. No JVD, murmurs, rubs, gallops or clicks. No pedal edema. Gastrointestinal system: Abdomen is nondistended, soft and nontender. No organomegaly or masses felt. Normal  bowel sounds heard. Central nervous system: Alert and oriented. No focal neurological deficits. Extremities: Symmetric 5 x 5 power. Skin: No rashes, lesions or ulcers Psychiatry: Judgement and insight appear normal. Mood & affect appropriate.     Data Reviewed: I have personally reviewed following labs and imaging studies  CBC:  Recent Labs Lab 09/10/16 0226 09/10/16 0602 09/12/16 0624  WBC 7.5 9.1 13.0*  NEUTROABS 5.7  --   --   HGB 10.0* 9.6* 10.8*  HCT 31.6* 30.7* 34.4*  MCV 76.7* 75.8* 77.5*  PLT 393 461* 315*   Basic Metabolic Panel:  Recent Labs Lab 09/10/16 0226 09/10/16 0602 09/12/16 0624  NA 124* 127* 130*  K 4.3 4.0 4.1  CL 88* 93* 91*  CO2 '29 27 31  '$ GLUCOSE 98 96 140*  BUN '7 6 11  '$ CREATININE 0.47 0.39* 0.39*  CALCIUM 9.4 8.8* 9.6   GFR: Estimated Creatinine Clearance: 54.7 mL/min (by C-G formula based on SCr of 0.39 mg/dL (L)). Liver Function Tests: No results for input(s): AST, ALT, ALKPHOS, BILITOT, PROT, ALBUMIN in the last 168 hours. No results for input(s): LIPASE, AMYLASE in the last 168 hours. No results for input(s): AMMONIA in the last 168 hours. Coagulation Profile: No results for input(s): INR, PROTIME in the last 168 hours. Cardiac Enzymes:  Recent Labs Lab 09/10/16 0226  TROPONINI <0.03   BNP (last 3 results) No results for input(s): PROBNP in the last 8760 hours. HbA1C: No results for input(s): HGBA1C in the last 72 hours. CBG:  Recent Labs Lab 09/10/16 0755 09/11/16 0812 09/12/16 0740  GLUCAP 86 109* 140*   Lipid Profile: No results for input(s): CHOL, HDL, LDLCALC, TRIG, CHOLHDL, LDLDIRECT in the last 72 hours. Thyroid Function Tests: No results for input(s): TSH, T4TOTAL, FREET4, T3FREE, THYROIDAB in the last 72 hours. Anemia Panel: No results for input(s): VITAMINB12, FOLATE, FERRITIN, TIBC, IRON, RETICCTPCT in the last 72 hours. Sepsis Labs: No results for input(s): PROCALCITON, LATICACIDVEN in the last 168  hours.  No results found for this or any previous visit (from the past 240 hour(s)).       Radiology Studies: Ct Head Wo Contrast  Result Date: 09/11/2016 CLINICAL DATA:  Headache and nausea. EXAM: CT HEAD WITHOUT CONTRAST TECHNIQUE: Contiguous axial images were obtained from the base of the skull through the vertex without intravenous contrast. COMPARISON:  04/09/2014 FINDINGS: Brain: No evidence of acute infarction, hemorrhage, hydrocephalus, extra-axial collection or mass lesion/mass effect. Vascular: No hyperdense vessel or unexpected calcification. Skull: Normal. Negative for fracture or focal lesion. Sinuses/Orbits: No acute finding. Other: None. IMPRESSION: 1. No acute intracranial abnormalities.  Normal brain. Electronically Signed   By: Kerby Moors M.D.   On: 09/11/2016 11:29        Scheduled Meds: . citalopram  40 mg Oral QHS  . docusate sodium  100 mg Oral Daily  . enoxaparin (LOVENOX) injection  40 mg Subcutaneous Q24H  . guaiFENesin  1,200 mg Oral BID  . ipratropium-albuterol  3 mL Nebulization QID  . levothyroxine  25 mcg Oral QAC breakfast  . methylPREDNISolone (SOLU-MEDROL) injection  60 mg Intravenous Q6H  . pantoprazole  40 mg Oral Daily  . senna  1 tablet Oral Daily  . sodium chloride flush  3 mL Intravenous Q12H  . sodium chloride flush  3 mL Intravenous Q12H   Continuous Infusions:   LOS: 1 day    Time spent: 35 minutes    Loretha Stapler, MD [facility] Pager [phone]  If 7PM-7AM, please contact night-coverage [email or web address] Password [facility] 09/12/2016, 3:49 PM

## 2016-09-12 NOTE — Progress Notes (Signed)
The patient was ambulated by staff approx. 50 feet and she sat up int chair to eat her lunch. Patient tolerated the activity without difficulty.  Notified Dr. Adair Patter about the activity and she verbalized understanding.

## 2016-09-12 NOTE — Care Management Important Message (Signed)
Important Message  Patient Details  Name: [name] MRN: [id number] Date of Birth: August 12, 1941   Medicare Important Message Given:  Yes    Sherald Barge, RN 09/12/2016, 2:58 PM

## 2016-09-13 DIAGNOSIS — J438 Other emphysema: Secondary | ICD-10-CM

## 2016-09-13 LAB — GLUCOSE, CAPILLARY: Glucose-Capillary: 137 mg/dL — ABNORMAL HIGH (ref 65–99)

## 2016-09-13 MED ORDER — SODIUM CHLORIDE 0.9 % IV SOLN
INTRAVENOUS | Status: DC
  Administered 2016-09-13: 17:00:00 via INTRAVENOUS

## 2016-09-13 MED ORDER — ACETAMINOPHEN ER 650 MG PO TBCR: 650.0000 mg | EXTENDED_RELEASE_TABLET | Freq: Three times a day (TID) | ORAL | 0 refills | Status: DC | PRN

## 2016-09-13 MED ORDER — CITALOPRAM HYDROBROMIDE 20 MG PO TABS
20.0000 mg | ORAL_TABLET | Freq: Every day | ORAL | Status: DC
  Administered 2016-09-13: 20 mg via ORAL
  Filled 2016-09-13: qty 1

## 2016-09-13 NOTE — Progress Notes (Signed)
PROGRESS NOTE    [name]  XNA:[id number] DOB: [birth year] DOA: 09/10/2016 PCP: [name], MD   Brief Narrative:  75 y.o.femalewith medical history significant of Copd on home oxygen, AAA, diastolic CHF, HTN comes in after possiblypassing out after going to the toilet in the middle of the night. Pt reports when she takes her flexeril at night it makes her very sleepy and loopy. Tonight she got up to go to the bathroom and she was sitting on the toilet when she could barely stay awake, she got up with her walker as she was afraid to fall and on the way back to her bed she collapsed to the floor. She is unsure if she passed out, as she was very somunlent and does not remember. She was able to get some help from her neighbor. She denies any chest pain, she has chronic back pain, she has been able to ambulate several times since arrival to the ED. She only takes APAP for her chronic pain at home. Pt referred for admission for possible syncope.   Assessment & Plan:   Principal Problem:   Syncope Active Problems:   Adenocarcinoma of lung (HCC)   Aneurysm (HCC)   Diastolic dysfunction   Hyponatremia   COPD (chronic obstructive pulmonary disease) (HCC)   Compression fracture of thoracic vertebra (HCC)   Rib pain on left side  Syncope versus mechanical fall from hypersomnolence - patient was on multiple medications at home which was causing hypersomnolence - previously was on Flexeril, Trileptal, alprazolam, Celexa, Phenergan - restarted Celexa and Vicodin for pain - No further episodes of falls in the hospital - Telemetry showing normal sinus rhythm but upon review showing few PVC's and missed beats -she is mildly orthostatic, on ivf  Hyponatremia - sodium 124>>127>>130 celexa decreased from '40mg'$  to '20mg'$  - Will check BMP in a.m -tsh level and cortisol level pending, cortisol level may not be reliable since patient has been on steroids  Acute bronchitis/COPD  exacerbation/chronic hypoxic respiratory failure on home o2 - patient coughing up yellow colored phlegm initially on presentation, no culture obtained - she is started on DuoNeb's, salmeterol, Mucinex, she does not have fever, abx was not given. - patient states breathing has improved - on 3L [place] at home- currently on 3.5L  Headache - patient complains of headache - hit her head on the floor at home - CT head obtained today was negative for acute abnormality - no headache noted today  Left side chest pain - pain started after fall at home rib series x-ray showed no acute fracture - Continue Vicodin when necessary for left-sided chest pain.  Anxiety/polypharmacy: Most of her home psych meds has stopped due to significant drowsy on presentation  she is on decreased dose of celexa.  DVT prophylaxis: Lovenox Code Status: Full code Family Communication: Discussed with patient Disposition Plan: likely discharge tomorrow with home health   Consultants:   None  Procedures:   None  Antimicrobials:   None    Subjective: Patient is very anxious, report still feeling weak.  Objective: Vitals:   09/13/16 0728 09/13/16 0900 09/13/16 1106 09/13/16 1321  BP:  118/60  125/60  Pulse:  97  90  Resp:  18  18  Temp:  97.8 F (36.6 C)  97.9 F (36.6 C)  TempSrc:  Oral  Oral  SpO2: 95% 93% 93% 90%  Weight:      Height:        Intake/Output Summary (Last 24 hours) at  09/13/16 1423 Last data filed at 09/13/16 1029  Gross per 24 hour  Intake              240 ml  Output              900 ml  Net             -660 ml   Filed Weights   09/10/16 0445 09/11/16 0600 09/12/16 0600  Weight: 62.8 kg (138 lb 7.2 oz) 63.4 kg (139 lb 12.4 oz) 63.1 kg (139 lb 1.8 oz)   Telemetry: frequent PVC's  Examination:  General exam: anxious Respiratory system: Respiratory effort normal. Faint wheezing on exhalation, soft breath sounds Cardiovascular system: S1 & S2 heard, RRR. No JVD, murmurs,  rubs, gallops or clicks. No pedal edema. Gastrointestinal system: Abdomen is nondistended, soft and nontender. No organomegaly or masses felt. Normal bowel sounds heard. Central nervous system: Alert and oriented. No focal neurological deficits. Extremities: Symmetric 5 x 5 power. Skin: No rashes, lesions or ulcers Psychiatry: Judgement and insight appear normal. Mood & affect appropriate.     Data Reviewed: I have personally reviewed following labs and imaging studies  CBC:  Recent Labs Lab 09/10/16 0226 09/10/16 0602 09/12/16 0624  WBC 7.5 9.1 13.0*  NEUTROABS 5.7  --   --   HGB 10.0* 9.6* 10.8*  HCT 31.6* 30.7* 34.4*  MCV 76.7* 75.8* 77.5*  PLT 393 461* 737*   Basic Metabolic Panel:  Recent Labs Lab 09/10/16 0226 09/10/16 0602 09/12/16 0624  NA 124* 127* 130*  K 4.3 4.0 4.1  CL 88* 93* 91*  CO2 '29 27 31  '$ GLUCOSE 98 96 140*  BUN '7 6 11  '$ CREATININE 0.47 0.39* 0.39*  CALCIUM 9.4 8.8* 9.6   GFR: Estimated Creatinine Clearance: 54.7 mL/min (by C-G formula based on SCr of 0.39 mg/dL (L)). Liver Function Tests: No results for input(s): AST, ALT, ALKPHOS, BILITOT, PROT, ALBUMIN in the last 168 hours. No results for input(s): LIPASE, AMYLASE in the last 168 hours. No results for input(s): AMMONIA in the last 168 hours. Coagulation Profile: No results for input(s): INR, PROTIME in the last 168 hours. Cardiac Enzymes:  Recent Labs Lab 09/10/16 0226  TROPONINI <0.03   BNP (last 3 results) No results for input(s): PROBNP in the last 8760 hours. HbA1C: No results for input(s): HGBA1C in the last 72 hours. CBG:  Recent Labs Lab 09/10/16 0755 09/11/16 0812 09/12/16 0740 09/13/16 0750  GLUCAP 86 109* 140* 137*   Lipid Profile: No results for input(s): CHOL, HDL, LDLCALC, TRIG, CHOLHDL, LDLDIRECT in the last 72 hours. Thyroid Function Tests: No results for input(s): TSH, T4TOTAL, FREET4, T3FREE, THYROIDAB in the last 72 hours. Anemia Panel: No results for  input(s): VITAMINB12, FOLATE, FERRITIN, TIBC, IRON, RETICCTPCT in the last 72 hours. Sepsis Labs: No results for input(s): PROCALCITON, LATICACIDVEN in the last 168 hours.  No results found for this or any previous visit (from the past 240 hour(s)).       Radiology Studies: No results found.      Scheduled Meds: . citalopram  20 mg Oral QHS  . docusate sodium  100 mg Oral Daily  . enoxaparin (LOVENOX) injection  40 mg Subcutaneous Q24H  . guaiFENesin  1,200 mg Oral BID  . ipratropium-albuterol  3 mL Nebulization QID  . levothyroxine  25 mcg Oral QAC breakfast  . pantoprazole  40 mg Oral Daily  . senna  1 tablet Oral Daily  . sodium chloride flush  3 mL Intravenous Q12H  . sodium chloride flush  3 mL Intravenous Q12H   Continuous Infusions:   LOS: 2 days    Time spent: 35 minutes    Asiana Benninger, MD PhD [facility] Pager [phone]  If 7PM-7AM, please contact night-coverage [email or web address] Password [email or web address] 09/13/2016, 2:23 PM

## 2016-09-14 DIAGNOSIS — M6281 Muscle weakness (generalized): Secondary | ICD-10-CM

## 2016-09-14 DIAGNOSIS — Z79899 Other long term (current) drug therapy: Secondary | ICD-10-CM

## 2016-09-14 DIAGNOSIS — R55 Syncope and collapse: Principal | ICD-10-CM

## 2016-09-14 DIAGNOSIS — E871 Hypo-osmolality and hyponatremia: Secondary | ICD-10-CM

## 2016-09-14 DIAGNOSIS — J449 Chronic obstructive pulmonary disease, unspecified: Secondary | ICD-10-CM

## 2016-09-14 LAB — CBC
HCT: 29.4 % — ABNORMAL LOW (ref 36.0–46.0)
Hemoglobin: 9.1 g/dL — ABNORMAL LOW (ref 12.0–15.0)
MCH: 23.9 pg — ABNORMAL LOW (ref 26.0–34.0)
MCHC: 31 g/dL (ref 30.0–36.0)
MCV: 77.4 fL — ABNORMAL LOW (ref 78.0–100.0)
Platelets: 494 10*3/uL — ABNORMAL HIGH (ref 150–400)
RBC: 3.8 MIL/uL — ABNORMAL LOW (ref 3.87–5.11)
RDW: 17.3 % — ABNORMAL HIGH (ref 11.5–15.5)
WBC: 9.2 10*3/uL (ref 4.0–10.5)

## 2016-09-14 LAB — BASIC METABOLIC PANEL
Anion gap: 8 (ref 5–15)
BUN: 12 mg/dL (ref 6–20)
CO2: 30 mmol/L (ref 22–32)
Calcium: 8.8 mg/dL — ABNORMAL LOW (ref 8.9–10.3)
Chloride: 94 mmol/L — ABNORMAL LOW (ref 101–111)
Creatinine, Ser: 0.43 mg/dL — ABNORMAL LOW (ref 0.44–1.00)
GFR calc Af Amer: >60 mL/min (ref >60)
GFR calc non Af Amer: >60 mL/min (ref >60)
Glucose, Bld: 93 mg/dL (ref 65–99)
Potassium: 3.9 mmol/L (ref 3.5–5.1)
Sodium: 132 mmol/L — ABNORMAL LOW (ref 135–145)

## 2016-09-14 LAB — CORTISOL: Cortisol, Plasma: 4.4 ug/dL

## 2016-09-14 LAB — MAGNESIUM: Magnesium: 1.9 mg/dL (ref 1.7–2.4)

## 2016-09-14 LAB — GLUCOSE, CAPILLARY: Glucose-Capillary: 98 mg/dL (ref 65–99)

## 2016-09-14 LAB — TSH: TSH: 6.882 u[IU]/mL — ABNORMAL HIGH (ref 0.350–4.500)

## 2016-09-14 MED ORDER — CITALOPRAM HYDROBROMIDE 40 MG PO TABS: 20.0000 mg | ORAL_TABLET | Freq: Every day | ORAL | 4 refills | Status: DC

## 2016-09-14 MED ORDER — CYCLOBENZAPRINE HCL 10 MG PO TABS: 10.0000 mg | ORAL_TABLET | Freq: Two times a day (BID) | ORAL | 1 refills | Status: DC | PRN

## 2016-09-14 NOTE — Care Management Note (Signed)
Case Management Note  Patient Details  Name: [name] MRN: [id number] Date of Birth: 1941/06/20  Expected Discharge Date:  09/14/16               Expected Discharge Plan:  [place]  In-House Referral:  NA  Discharge planning Services  CM Consult  Post Acute Care Choice:  Home Health Choice offered to:  Patient  HH Arranged:  RN, PT [place] Agency:  [facility] (know known as [facility])  Status of Service:  Completed, signed off  Additional Comments: Pt discharging home today with [facility] services through [facility]. Pt is aware that [facility] has 48hrs to initiate services. Vickie, of [facility], is aware of DC today and will obtain pt info from chart. PT has port oxygen to return home with.   Sherald Barge, RN 09/14/2016, 11:08 AM

## 2016-09-14 NOTE — Progress Notes (Signed)
Pt's IV catheter removed and intact. Pt's IV site clean dry and intact. Discharge instructions including medications and follow up appointments were reviewed and discussed with patient. Pt verbalized understanding of discharge instructions including medications and follow up appointments. All questions were answered and no further questions at this time. Pt in stable condition and in no acute distress at time of discharge. Pt will be escorted by nurse tech.

## 2016-09-14 NOTE — Discharge Summary (Signed)
Physician Discharge Summary  [name] XFG:[id number] DOB: 10/16/1940 DOA: 09/10/2016  PCP: [name], MD  Admit date: 09/10/2016 Discharge date: 09/14/2016  Admitted From: home Disposition:  home  Recommendations for Outpatient Follow-up:  1. Follow up with PCP in 1-2 weeks   Home Health:RN Equipment/Devices:Home Oxygen 3-3.5 L.  Discharge Condition:Stable CODE STATUS: Full code Diet recommendation: Heart healthy    Discharge Diagnoses:  Principal Problem:   Syncope   Active Problems:   Hyponatremia   Adenocarcinoma of lung (HCC)   Aneurysm (HCC)   Diastolic dysfunction   COPD (chronic obstructive pulmonary disease) (HCC)   Compression fracture of thoracic vertebra (HCC)   Rib pain on left side   Polypharmacy  Brief narrative/history of present illness 75 y.o.femalewith medical history of COPD on home oxygen, AAA, diastolic CHF, HTN presented with possible syncope while going to the bathroom in the mill of the night. Pt reports when she takes her flexeril at night it makes her very sleepy and loopy. Tonight she got up to go to the bathroom and she was sitting on the toilet when she could barely stay awake, she got up with her walker as she was afraid to fall and on the way back to her bed she collapsed to the floor. Patient not sure if she passed out since she was very somnolent. She was able to get some help from her neighbor. She denies any chest pain,  has chronic back pain, she has been able to ambulate several times since arrival to the ED. She only takes APAP for her chronic pain at home. Pt referred for admission for possible syncope.  Hospital course Vasovagal syncope versus mechanical fall --Patient is on multiple home medications including Flexeril,? Trileptal, alprazolam, Celexa and Phenergan which can contribute to hypersomnolence. -Patient has been stable on telemetry (except for a few PVCs). Was also mildly orthostatic for which he  received IV fluids. -CT on admission was unremarkable for acute injury. 2-D echo showed normal EF with grade 1 diastolic dysfunction, mild LVH. -I have reduced her Flexeril to twice daily (was 3 times a day), reduced dose of Celexa to 20 mg (was 40 mg) and discontinue Phenergan. -May resume alprazolam. I do not see Trileptal on her home medication list. -Patient has been ambulatory in the hallway without any help. I will arrange home health to monitor her medications.  ? Acute bronchitis/mild COPD exacerbation.  reportedly was coughing up yellow phlegm on admission. Received DuoNeb's and Mucinex. Maintaining O2 sat on 3-2.5 L. Stable to be discharged home.  Left-sided chest pain and headache Secondary to fall. Head CT unremarkable. Chest x-ray negative for any fractures.  Anxiety and polypharmacy Home medications adjusted/minimized.  Patient stable to be discharged home. She lives alone and informs that her granddaughter will be staying with her for some time.  Family communication: None at bedside  consults: None  Discharge Instructions     Medication List    STOP taking these medications   promethazine 25 MG tablet Commonly known as:  PHENERGAN     TAKE these medications   acetaminophen 650 MG CR tablet Commonly known as:  TYLENOL Take 1 tablet (650 mg total) by mouth every 8 (eight) hours as needed for pain. What changed:  how much to take   albuterol 108 (90 Base) MCG/ACT inhaler Commonly known as:  PROVENTIL HFA;VENTOLIN HFA Inhale 2 puffs into the lungs every 6 (six) hours as needed for wheezing or shortness of breath.   albuterol (  2.5 MG/3ML) 0.083% nebulizer solution Commonly known as:  PROVENTIL Take 3 mLs (2.5 mg total) by nebulization every 6 (six) hours as needed for wheezing or shortness of breath.   ALPRAZolam 0.5 MG tablet Commonly known as:  XANAX Take 1 tablet (0.5 mg total) by mouth 2 (two) times daily as needed for anxiety or sleep.   AMITIZA 24  MCG capsule Generic drug:  lubiprostone Take 48 mcg by mouth 2 (two) times daily with a meal.   calcium citrate-vitamin D 315-200 MG-UNIT tablet Commonly known as:  CITRACAL+D Take 1 tablet by mouth daily.   citalopram 40 MG tablet Commonly known as:  CELEXA Take 0.5 tablets (20 mg total) by mouth at bedtime. What changed:  how much to take   cyclobenzaprine 10 MG tablet Commonly known as:  FLEXERIL Take 1 tablet (10 mg total) by mouth 3 times/day as needed-between meals & bedtime for muscle spasms. What changed:  when to take this   dicyclomine 20 MG tablet Commonly known as:  BENTYL Take 1 tablet (20 mg total) by mouth every 8 (eight) hours as needed for spasms.   levothyroxine 25 MCG tablet Commonly known as:  SYNTHROID, LEVOTHROID Take 1 tablet (25 mcg total) by mouth daily before breakfast.   multivitamin with minerals Tabs tablet Take 1 tablet by mouth daily.   omeprazole 40 MG capsule Commonly known as:  PRILOSEC Take 1 capsule (40 mg total) by mouth daily.   umeclidinium-vilanterol 62.5-25 MCG/INH Aepb Commonly known as:  ANORO ELLIPTA Inhale 1 puff into the lungs daily.      Follow-up Information    PICKARD,WARREN TOM, MD Follow up in 1 week(s).   Specialty:  Family Medicine Why:  hospital discharge follow up, repeat sodium level at follow up Contact information: [address] [place] [address] [phone]          Allergies  Allergen Reactions  . Ciprofloxacin Hcl Hives  . Iohexol      Desc: Per [facility], pt is allergic to IV contrast, no type of reaction was available. Pt cannot remember but first reacted around 15 yrs ago from an IVP. Notes were date from 2009 at urology center., Onset Date: [id number]   . Prozac [Fluoxetine Hcl] Rash  . Sulfonamide Derivatives Hives and Rash      Procedures/Studies: Dg Chest 2 View  Result Date: 09/10/2016 CLINICAL DATA:  Status post fall backwards, with concern for chest injury.  Initial encounter. EXAM: CHEST  2 VIEW COMPARISON:  Chest radiograph performed 02/29/2016, and CT of the chest performed 06/04/2016 FINDINGS: The lungs are well-aerated. Mild bibasilar atelectasis is noted. Known pulmonary nodules are better characterized on prior CT. There is no evidence of pleural effusion or pneumothorax. The heart is mildly enlarged. No acute osseous abnormalities are seen. Thoracolumbar spinal fusion hardware is partially imaged. Multiple chronic compression deformities are noted along the thoracic and upper lumbar spine. IMPRESSION: 1. No displaced rib fracture seen. Multiple chronic compression deformities along the thoracic and upper lumbar spine. 2. Mild bibasilar atelectasis noted. Known pulmonary nodules are better characterized on prior CT. Electronically Signed   By: Garald Balding M.D.   On: 09/10/2016 03:10   Dg Ribs Unilateral Left  Result Date: 09/10/2016 CLINICAL DATA:  Fall from bed this morning with left-sided chest pain, initial encounter EXAM: LEFT RIBS - 2 VIEW COMPARISON:  None. FINDINGS: No fracture or other bone lesions are seen involving the ribs. Bibasilar atelectatic changes are again seen. IMPRESSION: No  acute rib abnormality noted. Electronically Signed   By: Inez Catalina M.D.   On: 09/10/2016 11:03   Dg Thoracic Spine 2 View  Result Date: 09/10/2016 CLINICAL DATA:  Acute onset of upper back pain, after fall backwards. Initial encounter. EXAM: THORACIC SPINE 2 VIEWS COMPARISON:  Chest radiograph performed 02/29/2016, and CT of the chest performed 06/04/2016 FINDINGS: There is question of mild new compression deformity of vertebral body T6, which could be acute or subacute in nature. Mild compression deformity of T8 is stable in appearance. Compression deformities of T12 and L1 are also unchanged. Underlying thoracolumbar spinal fusion hardware is again noted extending inferiorly from T10. Intervertebral disc spaces are grossly preserved. The visualized portions  of both lungs are clear. The mediastinum is unremarkable in appearance. IMPRESSION: Question of mild new compression deformity of vertebral body T6, which could be acute or subacute in nature. Chronic compression deformities of T8, T12 and L1 are unchanged, with underlying thoracolumbar spinal fusion hardware. Electronically Signed   By: Garald Balding M.D.   On: 09/10/2016 03:13   Dg Lumbar Spine Complete  Result Date: 09/10/2016 CLINICAL DATA:  Acute onset of lower back pain, after fall backwards. Initial encounter. EXAM: LUMBAR SPINE - COMPLETE 4+ VIEW COMPARISON:  Lumbar spine radiographs performed 03/14/2015, and CT of the chest performed 06/04/2016 FINDINGS: Compression deformities of vertebral bodies at T12 and L1 are relatively stable from the prior CT. Mild degenerative change is noted along the lower lumbar spine. Thoracolumbar spinal fusion hardware is again noted at T10-L2. 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 Clips are noted within the right upper quadrant, reflecting prior cholecystectomy. IMPRESSION: 1. No evidence of acute fracture or subluxation along the lumbar spine. 2. Chronic compression deformities of T12 and L1 are relatively stable from the prior CT. Mild degenerative change along the lower lumbar spine. Thoracolumbar spinal fusion hardware at T10-L2. 3. **An incidental finding of potential clinical significance has been found. 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 This has increased in size from 3.4 cm in 2016. Recommend followup by ultrasound in 2 years. This recommendation follows ACR consensus guidelines: White Paper of the ACR Incidental Findings Committee II on Vascular Findings. J Am Coll Radiol 2013; 10:789-794.** Electronically Signed   By: Garald Balding M.D.   On: 09/10/2016 03:17   Dg Pelvis 1-2  Views  Result Date: 09/10/2016 CLINICAL DATA:  Status post fall, with pelvic pain. Initial encounter. EXAM: PELVIS - 1-2 VIEW COMPARISON:  CT of the abdomen and pelvis from 11/19/2014 FINDINGS: There is no evidence of fracture or dislocation. Axial joint space narrowing is noted at the right hip, with mild underlying sclerosis. The left hip joint is unremarkable in appearance. Mild degenerative change is noted at the lower lumbar spine. The sacroiliac joints are grossly unremarkable. The visualized bowel gas pattern is unremarkable in appearance. IMPRESSION: 1. No evidence of fracture or dislocation. 2. Axial joint space narrowing at the right hip, with mild associated sclerosis. 3. Mild degenerative change at the lower lumbar spine. Electronically Signed   By: Garald Balding M.D.   On: 09/10/2016 03:18   Ct Head Wo Contrast  Result Date: 09/11/2016 CLINICAL DATA:  Headache and nausea. EXAM: CT HEAD WITHOUT CONTRAST TECHNIQUE: Contiguous axial images were obtained from the base of the skull through the vertex without intravenous contrast. COMPARISON:  04/09/2014 FINDINGS: Brain: No  evidence of acute infarction, hemorrhage, hydrocephalus, extra-axial collection or mass lesion/mass effect. Vascular: No hyperdense vessel or unexpected calcification. Skull: Normal. Negative for fracture or focal lesion. Sinuses/Orbits: No acute finding. Other: None. IMPRESSION: 1. No acute intracranial abnormalities.  Normal brain. Electronically Signed   By: Kerby Moors M.D.   On: 09/11/2016 11:29   Mm Screening Breast Tomo Bilateral  Result Date: 08/22/2016 CLINICAL DATA:  Screening. EXAM: 2D DIGITAL SCREENING BILATERAL MAMMOGRAM WITH CAD AND ADJUNCT TOMO COMPARISON:  Previous exam(s). ACR Breast Density Category b: There are scattered areas of fibroglandular density. FINDINGS: There are no findings suspicious for malignancy. Images were processed with CAD. IMPRESSION: No mammographic evidence of malignancy. A result  letter of this screening mammogram will be mailed directly to the patient. RECOMMENDATION: Screening mammogram in one year. (Code:SM-B-01Y) BI-RADS CATEGORY  1: Negative. Electronically Signed   By: Lajean Manes M.D.   On: 08/22/2016 16:48    2d echo Study Conclusions  - Left ventricle: The cavity size was normal. Wall thickness was   increased in a pattern of mild LVH. Systolic function was normal.   The estimated ejection fraction was in the range of 60% to 65%.   Wall motion was normal; there were no regional wall motion   abnormalities. Doppler parameters are consistent with abnormal   left ventricular relaxation (grade 1 diastolic dysfunction). - Aortic valve: Mildly calcified annulus. Trileaflet. - Mitral valve: Systolic bowing without prolapse. There was no   significant regurgitation. - Right atrium: Central venous pressure (est): 3 mm Hg. - Atrial septum: The interatrial septum was hypermobile. No defect   or patent foramen ovale was identified. - Tricuspid valve: There was trivial regurgitation. - Pulmonary arteries: PA peak pressure: 39 mm Hg (S). - Pericardium, extracardiac: There was no pericardial effusion.  Impressions:  - Mild LVH with LVEF 60-65%. Grade 1 diastolic dysfunction.   Systolic bowing of the mitral valve without definitive prolapse.   No significant mitral regurgitation. Trivial tricuspid   regurgitation with PASP estimated 39 mmHg. Hypermobile   interatrial septum without obvious PFO or ASD.   Subjective: No overnight issues, feels better   Discharge Exam: Vitals:   09/13/16 2134 09/14/16 0412  BP: 116/62 122/69  Pulse: 91 82  Resp: 20 18  Temp: 97.7 F (36.5 C) 97.4 F (36.3 C)   Vitals:   09/13/16 1911 09/13/16 2134 09/14/16 0412 09/14/16 0739  BP:  116/62 122/69   Pulse:  91 82   Resp:  20 18   Temp:  97.7 F (36.5 C) 97.4 F (36.3 C)   TempSrc:  Oral Oral   SpO2: 95% 94% 92% (!) 1%  Weight:   61.1 kg (134 lb 12.8 oz)    Height:        General:  not in acute distress HEENT: moist mucosa, supple neck Cardiovascular: RRR, S1/S2 +, no rubs, no gallops Respiratory: clear b/l, no added sounds GI: Soft, NT, ND Extremities: Warm, no edema,    The results of significant diagnostics from this hospitalization (including imaging, microbiology, ancillary and laboratory) are listed below for reference.     Microbiology: No results found for this or any previous visit (from the past 240 hour(s)).   Labs: BNP (last 3 results) No results for input(s): BNP in the last 8760 hours. Basic Metabolic Panel:  Recent Labs Lab 09/10/16 0226 09/10/16 0602 09/12/16 0624 09/14/16 0600  NA 124* 127* 130* 132*  K 4.3 4.0 4.1 3.9  CL 88* 93* 91* 94*  CO2 '29 27 31 30  '$ GLUCOSE 98 96 140* 93  BUN '7 6 11 12  '$ CREATININE 0.47 0.39* 0.39* 0.43*  CALCIUM 9.4 8.8* 9.6 8.8*  MG  --   --   --  1.9   Liver Function Tests: No results for input(s): AST, ALT, ALKPHOS, BILITOT, PROT, ALBUMIN in the last 168 hours. No results for input(s): LIPASE, AMYLASE in the last 168 hours. No results for input(s): AMMONIA in the last 168 hours. CBC:  Recent Labs Lab 09/10/16 0226 09/10/16 0602 09/12/16 0624 09/14/16 0600  WBC 7.5 9.1 13.0* 9.2  NEUTROABS 5.7  --   --   --   HGB 10.0* 9.6* 10.8* 9.1*  HCT 31.6* 30.7* 34.4* 29.4*  MCV 76.7* 75.8* 77.5* 77.4*  PLT 393 461* 455* 494*   Cardiac Enzymes:  Recent Labs Lab 09/10/16 0226  TROPONINI <0.03   BNP: Invalid input(s): POCBNP CBG:  Recent Labs Lab 09/10/16 0755 09/11/16 0812 09/12/16 0740 09/13/16 0750 09/14/16 0734  GLUCAP 86 109* 140* 137* 98   D-Dimer No results for input(s): DDIMER in the last 72 hours. Hgb A1c No results for input(s): HGBA1C in the last 72 hours. Lipid Profile No results for input(s): CHOL, HDL, LDLCALC, TRIG, CHOLHDL, LDLDIRECT in the last 72 hours. Thyroid function studies  Recent Labs  09/14/16 0600  TSH 6.882*   Anemia work  up No results for input(s): VITAMINB12, FOLATE, FERRITIN, TIBC, IRON, RETICCTPCT in the last 72 hours. Urinalysis    Component Value Date/Time   COLORURINE STRAW (A) 09/10/2016 1831   APPEARANCEUR CLEAR 09/10/2016 1831   LABSPEC <1.005 (L) 09/10/2016 1831   PHURINE 6.0 09/10/2016 1831   GLUCOSEU NEGATIVE 09/10/2016 1831   HGBUR NEGATIVE 09/10/2016 1831   BILIRUBINUR NEGATIVE 09/10/2016 1831   KETONESUR NEGATIVE 09/10/2016 1831   PROTEINUR NEGATIVE 09/10/2016 1831   UROBILINOGEN 0.2 06/01/2015 1145   NITRITE NEGATIVE 09/10/2016 1831   LEUKOCYTESUR NEGATIVE 09/10/2016 1831   Sepsis Labs Invalid input(s): PROCALCITONIN,  WBC,  LACTICIDVEN Microbiology No results found for this or any previous visit (from the past 240 hour(s)).   Time coordinating discharge: < 30 minutes  SIGNED:   Louellen Molder, MD  [facility] 09/14/2016, 10:30 AM Pager   If 7PM-7AM, please contact night-coverage [email or web address] Password [email or web address]

## 2016-09-16 ENCOUNTER — Encounter: Payer: Self-pay | Admitting: Family Medicine

## 2016-09-17 ENCOUNTER — Encounter: Payer: Self-pay | Admitting: Family Medicine

## 2016-09-18 ENCOUNTER — Telehealth: Payer: Self-pay | Admitting: Family Medicine

## 2016-09-18 NOTE — Telephone Encounter (Signed)
Pt called [facility] wanting to know if she could get something for her back pain?

## 2016-09-18 NOTE — Telephone Encounter (Signed)
Tramadol 50 q 6 hrs prn pain (30)

## 2016-09-19 ENCOUNTER — Telehealth: Payer: Self-pay | Admitting: Family Medicine

## 2016-09-19 MED ORDER — TRAMADOL HCL 50 MG PO TABS: 50.0000 mg | ORAL_TABLET | Freq: Four times a day (QID) | ORAL | 0 refills | Status: DC | PRN

## 2016-09-19 NOTE — Telephone Encounter (Signed)
Received a call from [facility] from [facility] stating that she did go out to pt's house and assess her situation and informed pt of cost as she does have a copay for the services, she qualified for services but denied services due to cost of copay and she just wanted to let us know that she will not be receiving services for home health.

## 2016-09-19 NOTE — Telephone Encounter (Signed)
Medication called/sent to requested pharmacy and pt aware 

## 2016-09-21 ENCOUNTER — Ambulatory Visit (INDEPENDENT_AMBULATORY_CARE_PROVIDER_SITE_OTHER): Payer: PPO | Admitting: Family Medicine

## 2016-09-21 ENCOUNTER — Ambulatory Visit (HOSPITAL_COMMUNITY)
Admission: RE | Admit: 2016-09-21 | Discharge: 2016-09-21 | Disposition: A | Discharge status: Home / Self Care | Payer: PPO | Source: Ambulatory Visit | Attending: Family Medicine | Admitting: Family Medicine

## 2016-09-21 ENCOUNTER — Encounter: Payer: Self-pay | Admitting: Family Medicine

## 2016-09-21 VITALS — BP 126/78 | HR 86 | Temp 98.8°F | Resp 20 | Ht 66.0 in | Wt 136.0 lb

## 2016-09-21 DIAGNOSIS — I700 Atherosclerosis of aorta: Secondary | ICD-10-CM | POA: Diagnosis not present

## 2016-09-21 DIAGNOSIS — J189 Pneumonia, unspecified organism: Secondary | ICD-10-CM | POA: Diagnosis not present

## 2016-09-21 DIAGNOSIS — J984 Other disorders of lung: Secondary | ICD-10-CM | POA: Diagnosis not present

## 2016-09-21 DIAGNOSIS — J449 Chronic obstructive pulmonary disease, unspecified: Secondary | ICD-10-CM | POA: Insufficient documentation

## 2016-09-21 DIAGNOSIS — M8000XA Age-related osteoporosis with current pathological fracture, unspecified site, initial encounter for fracture: Secondary | ICD-10-CM | POA: Diagnosis not present

## 2016-09-21 DIAGNOSIS — J441 Chronic obstructive pulmonary disease with (acute) exacerbation: Secondary | ICD-10-CM | POA: Diagnosis not present

## 2016-09-21 DIAGNOSIS — R0989 Other specified symptoms and signs involving the circulatory and respiratory systems: Secondary | ICD-10-CM | POA: Diagnosis not present

## 2016-09-21 DIAGNOSIS — R05 Cough: Secondary | ICD-10-CM | POA: Diagnosis not present

## 2016-09-21 IMAGING — CT CT ABD-PELV W/ CM
2 of 4 series · 17 of 46 positions shown, 19 images · IV contrast (Omnipaque 300)
Comparison: CT chest, abdomen and pelvis 01/24/2012.

CLINICAL DATA: Left-sided abdominal tenderness and blow June 2014.

EXAM:
CT ABDOMEN AND PELVIS WITH CONTRAST
TECHNIQUE: Multidetector CT imaging of the abdomen and pelvis was performed
using the standard protocol following bolus administration of
intravenous contrast.
CONTRAST:  100 mL OMNIPAQUE IOHEXOL 300 MG/ML  SOLN

[Series 2: abd_pel_with 5.0 b40f · axial · 0.66mm/px · z∈[+610,+980]mm · 14 of 82 slices shown, 16 images]
[im 4/82  soft-tissue]
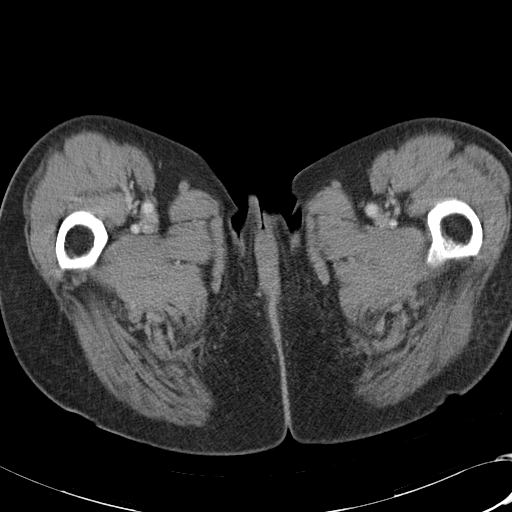
[im 4/82  bone]
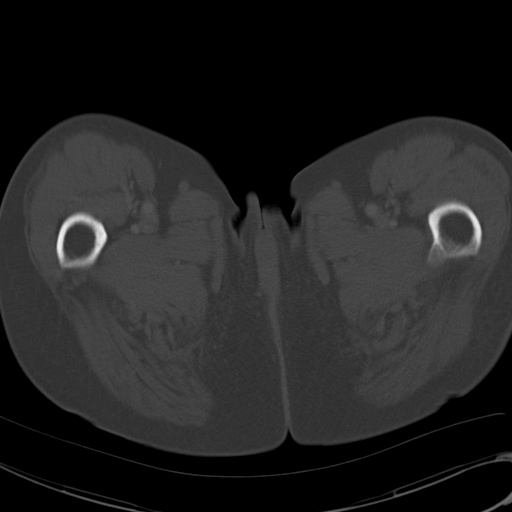
[im 12/82  soft-tissue]
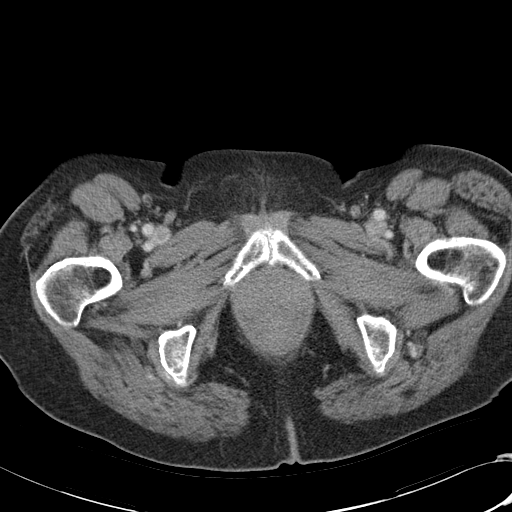
[im 16/82  soft-tissue]
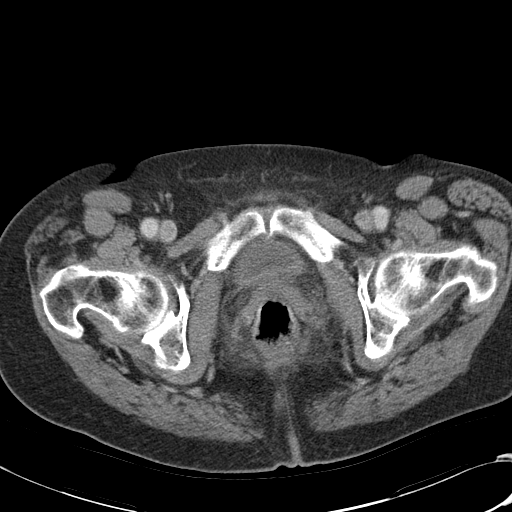
[im 24/82  soft-tissue]
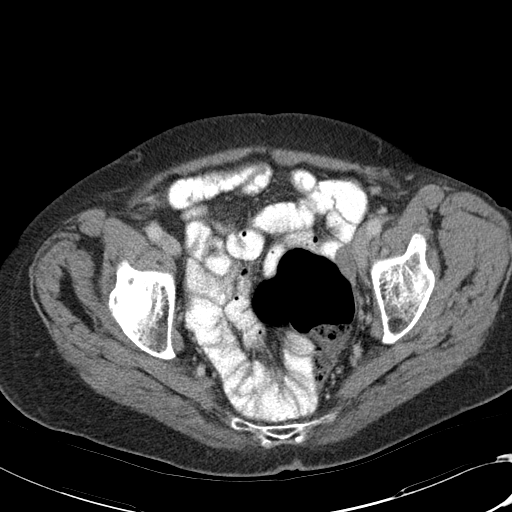
[im 28/82  soft-tissue]
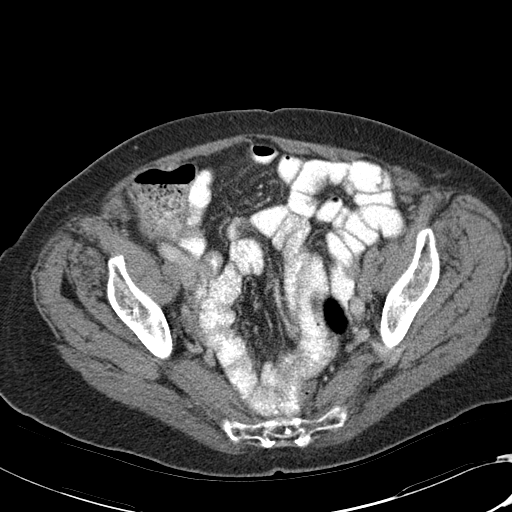
[im 31/82  soft-tissue]
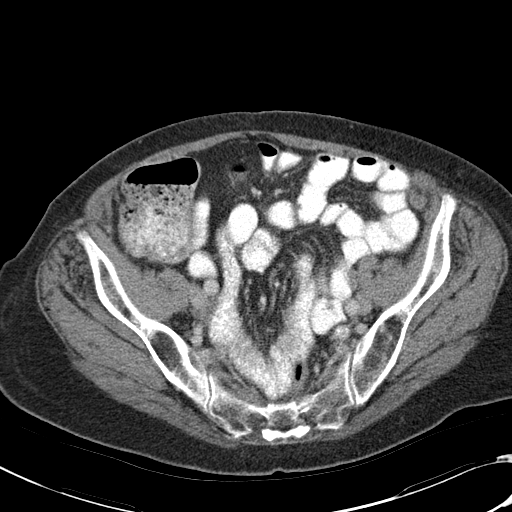
[im 39/82  soft-tissue]
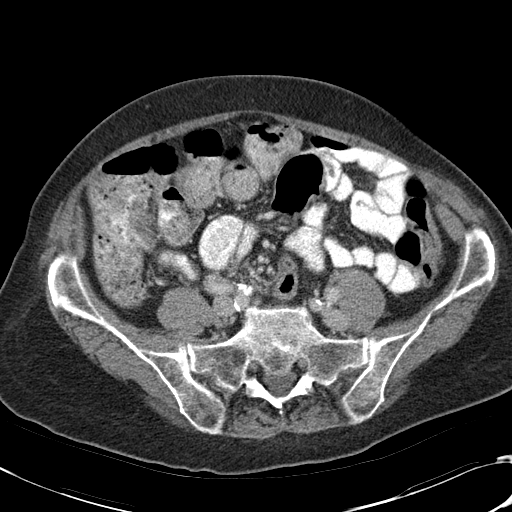
[im 43/82  soft-tissue]
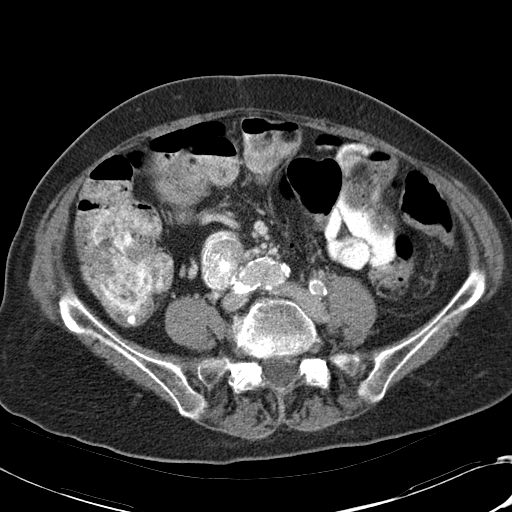
[im 51/82  soft-tissue]
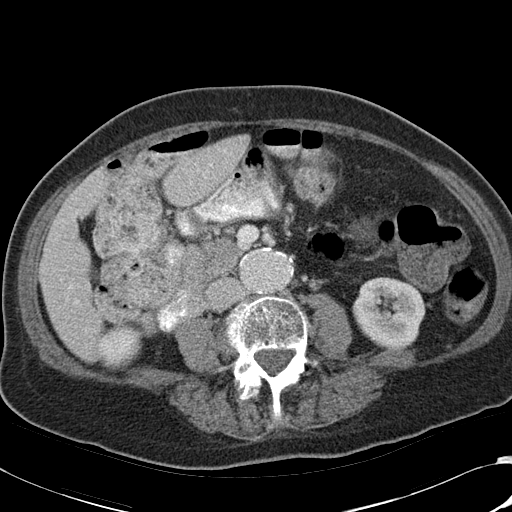
[im 51/82  bone]
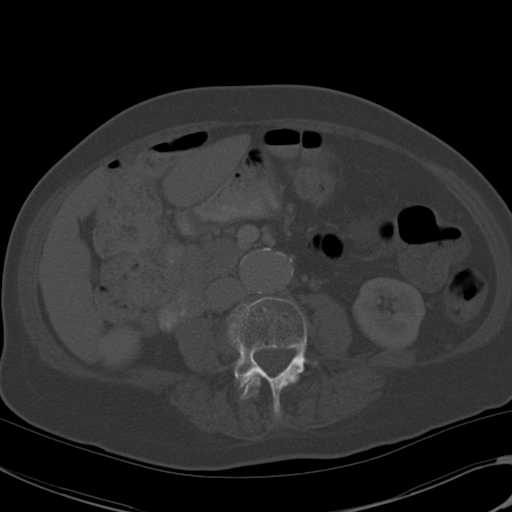
[im 55/82  soft-tissue]
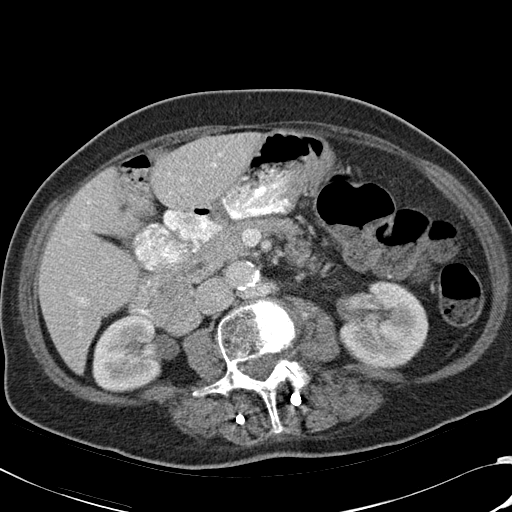
[im 62/82  soft-tissue]
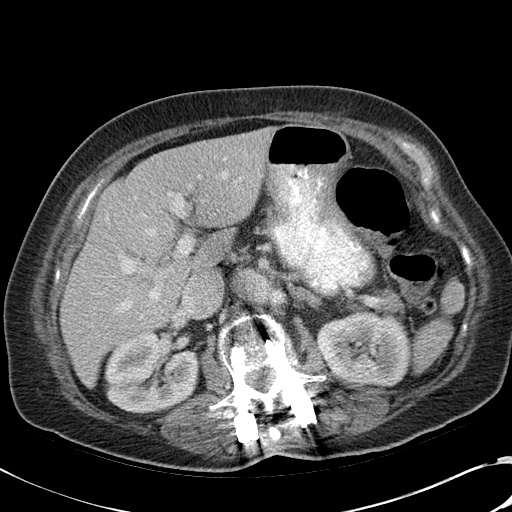
[im 66/82  soft-tissue]
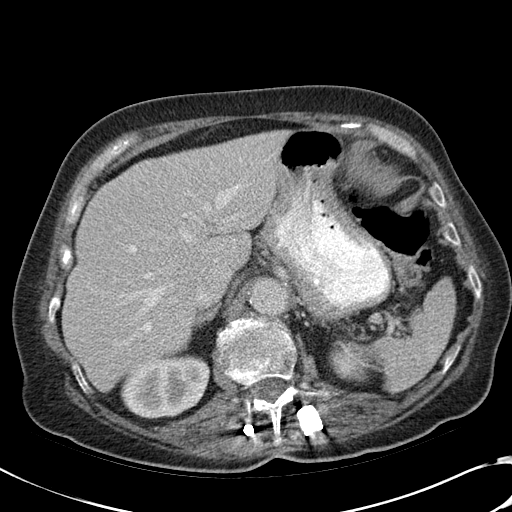
[im 70/82  soft-tissue]
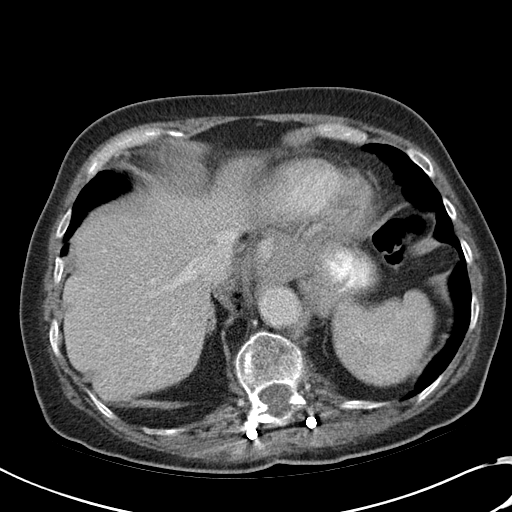
[im 78/82  soft-tissue]
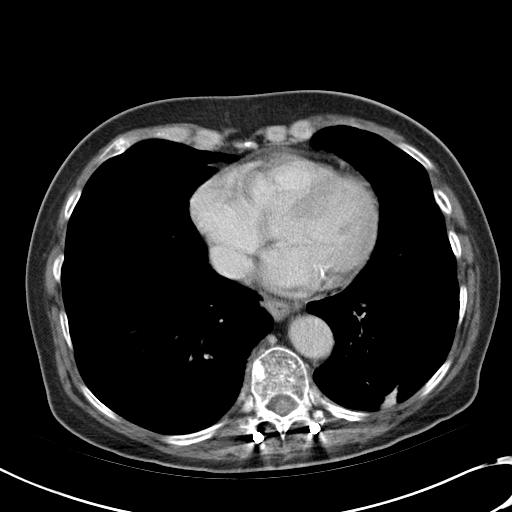

[Series 3: abd_pel_with 3.0 spo cor · coronal · 0.75mm/px · 3 of 85 slices shown]
[im 29/85  soft-tissue]
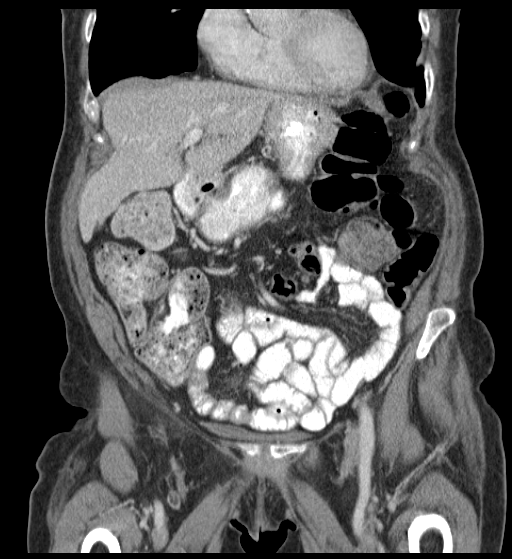
[im 38/85  soft-tissue]
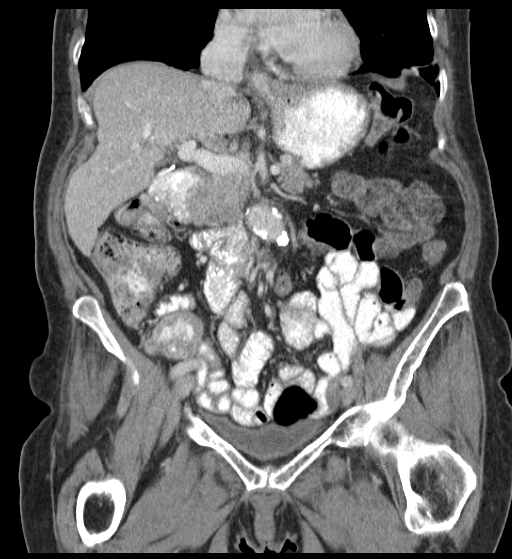
[im 47/85  soft-tissue]
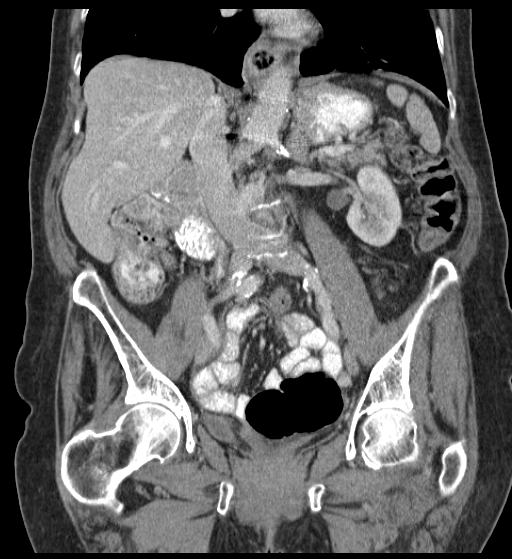

[17 of 46 positions shown; findings below may reference images not displayed]

FINDINGS: Mild bibasilar atelectasis or scar is identified. There is a small
hiatal hernia. Small pericardial effusion is identified. No pleural
effusion.

The patient is status post cholecystectomy. The liver, spleen,
pancreas, adrenal glands and left kidney all appear normal. Punctate
nonobstructing stone midpole right kidney is noted. Infrarenal
abdominal aorta aneurysm measuring 3.4 cm and right common iliac
artery aneurysm measuring 2.3 cm are unchanged.

There is a very large volume of stool in the ascending and
transverse colon. The stomach and small bowel are unremarkable. No
lymphadenopathy or fluid is identified.

Again seen is postoperative change of partial thoracolumbar fusion
for a T12 compression fracture. Convex right lumbar scoliosis and
multilevel spondylosis are noted. No worrisome bony lesion is seen.
IMPRESSION: No acute abnormality.

Large volume of stool ascending and transverse colon.

No change in 3.4 cm infrarenal aortic and 2.3 cm right common iliac
artery aneurysms.

Punctate nonobstructing stone right kidney, unchanged.

Small hiatal hernia.

## 2016-09-21 MED ORDER — CEFUROXIME AXETIL 500 MG PO TABS: 500.0000 mg | ORAL_TABLET | Freq: Two times a day (BID) | ORAL | 0 refills | Status: DC

## 2016-09-21 MED ORDER — LEVOTHYROXINE SODIUM 50 MCG PO TABS: 25.0000 ug | ORAL_TABLET | Freq: Every day | ORAL | 3 refills | Status: DC

## 2016-09-21 MED ORDER — AZITHROMYCIN 1 G PO PACK: 1.0000 g | PACK | Freq: Once | ORAL | Status: DC

## 2016-09-21 MED ORDER — LEVOTHYROXINE SODIUM 50 MCG PO TABS: 50.0000 ug | ORAL_TABLET | Freq: Every day | ORAL | 3 refills | Status: DC

## 2016-09-21 NOTE — Progress Notes (Signed)
Subjective:    Patient ID: [name], female    DOB: June 03, 1941, 75 y.o.   MRN: [id number]  HPI Patient was recently admitted to the hospital after a possible syncopal episode. X-rays revealed a new compression fracture at T6, 3 old compression fracture seen in the thoracic spine. Otherwise workup was unremarkable. They recommended decreasing her Flexeril twice a day. TSH obtained in the hospital was elevated at 6.6. No other medication changes were made. She is here today for follow-up. Patient appears fatigued and tired. She reports pain in the middle of her back with inspiration. On examination today she has prominent bibasilar crackles. Recent x-ray in the hospital did show mild atelectasis however her exam findings or pronounced and do not seem consistent with atelectasis. It makes be concerned that due to possible poor inspiration secondary to her recent vertebral fracture, she may have developed a secondary pneumonia in the regions of atelectasis. She does report a cough productive of green mucus and worsening shortness of breath even though she is on 2 L. She quit bisphosphonate therapy under the direction of her dentist. Past Medical History:  Diagnosis Date  . 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 Allergic rhinitis   . 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 Anxiety disorder   . Aortic aneurysm ([place])   . Complication of anesthesia 2011   bloodpressure dropped during colonoscopy, not problems since  . Compression fracture   . COPD (chronic obstructive pulmonary disease) ([place])   . Depression   . Diastolic dysfunction   . Diverticulosis   . Dysphagia   . Emphysema lung ([place]) 11/08/2014  . Hiatal hernia   . Hypothyroidism   . IBS (irritable bowel syndrome)   . On home O2 12/15/2013   chronic hypoxia  . PUD (peptic ulcer disease)    72yr  . Shortness of breath    with exertion  . Trigeminal neuralgia    Past Surgical  History:  Procedure Laterality Date  . ABDOMINAL HYSTERECTOMY    . BACK SURGERY  January 13, 2014  . CATARACT EXTRACTION    . CATARACT EXTRACTION W/PHACO  09/02/2012   Procedure: CATARACT EXTRACTION PHACO AND INTRAOCULAR LENS PLACEMENT (IOC);  Surgeon: MElta GuadeloupeT. SGershon Crane MD;  Location: [facility];  Service: Ophthalmology;  Laterality: Left;  CDE:10.35  . CHOLECYSTECTOMY    . 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 COLONOSCOPY N/A 11/23/2014   Procedure: COLONOSCOPY;  Surgeon: SDanie Binder MD;  [facility]: [facility];  Service: Endoscopy;  Laterality: N/A;  930   . ENDARTERECTOMY Right 08/21/2013   Procedure: RIGHT CAROTID ANEURYSM RESECTION;  Surgeon: TRosetta Posner MD;  [facility];  Service: Vascular;  Laterality: Right;  . ESOPHAGOGASTRODUODENOSCOPY  11/13/2009   w/dilation to 175m gastric ulceration (H.Pylori) s/p treatment  . ESOPHAGOGASTRODUODENOSCOPY  11/21/2009   distal esophageal web, gastritis  . ESOPHAGOGASTRODUODENOSCOPY  03/14/12   SLYWV:[id number] the distal esophagus/Mild gastritis/small HH. + H.pylori, prescribed Pylera. Finished treatment.   . fatty tumor removal from lt groin    . FRACTURE SURGERY Left    hand and left ring finger  . LUNG CANCER SURGERY  12/2010   Left VATS, minithoracotomy, LLL superior segmentectomy  . ORIF WRIST FRACTURE Left 04/09/2014   Procedure: OPEN REDUCTION INTERNAL FIXATION (ORIF) WRIST FRACTURE;  Surgeon: TiRenette ButtersMD;  [facility] Service: Orth[facility]ics;  Laterality: Left;  . PERCUTANEOUS PINNING Left 04/09/2014   Procedure: PERCUTANEOUS PINNING EXTREMITY;  Surgeon: Renette Butters, MD;  Location: [facility];  Service: Orthopedics;  Laterality: Left;  . TUBAL LIGATION    . vocal cord surgery  02/06/2011   laryngoscopy with bilateral vocal cord Radiesse injection for vocal cord paralysis  . YAG LASER APPLICATION Left 95/63/8756   Procedure: YAG LASER APPLICATION;  Surgeon: Rutherford Guys, MD;  [facility]: [facility];  Service: Ophthalmology;  Laterality: Left;   Current Outpatient Prescriptions on File Prior to Visit  Medication Sig Dispense Refill  . acetaminophen (TYLENOL) 650 MG CR tablet Take 1 tablet (650 mg total) by mouth every 8 (eight) hours as needed for pain. 30 tablet 0  . albuterol (PROVENTIL HFA;VENTOLIN HFA) 108 (90 Base) MCG/ACT inhaler Inhale 2 puffs into the lungs every 6 (six) hours as needed for wheezing or shortness of breath. 1 Inhaler 6  . albuterol (PROVENTIL) (2.5 MG/3ML) 0.083% nebulizer solution Take 3 mLs (2.5 mg total) by nebulization every 6 (six) hours as needed for wheezing or shortness of breath. 75 mL 12  . ALPRAZolam (XANAX) 0.5 MG tablet Take 1 tablet (0.5 mg total) by mouth 2 (two) times daily as needed for anxiety or sleep. 60 tablet 1  . AMITIZA 24 MCG capsule Take 48 mcg by mouth 2 (two) times daily with a meal.     . calcium citrate-vitamin D (CITRACAL+D) 315-200 MG-UNIT per tablet Take 1 tablet by mouth daily.     . citalopram (CELEXA) 40 MG tablet Take 0.5 tablets (20 mg total) by mouth at bedtime. 90 tablet 4  . Multiple Vitamin (MULTIVITAMIN WITH MINERALS) TABS tablet Take 1 tablet by mouth daily.    Marland Kitchen omeprazole (PRILOSEC) 40 MG capsule Take 1 capsule (40 mg total) by mouth daily. 90 capsule 3  . traMADol (ULTRAM) 50 MG tablet Take 1 tablet (50 mg total) by mouth every 6 (six) hours as needed. 30 tablet 0  . umeclidinium-vilanterol (ANORO ELLIPTA) 62.5-25 MCG/INH AEPB Inhale 1 puff into the lungs daily. 90 each 3   No current facility-administered medications on file prior to visit.    Allergies  Allergen Reactions  . Ciprofloxacin Hcl Hives  . Iohexol      Desc: Per [facility], pt is allergic to IV contrast, no type of reaction was available. Pt cannot remember but first reacted around 15 yrs ago from an IVP. Notes were date from 2009 at urology center., [place] Date: [id number]   . Prozac [Fluoxetine Hcl] Rash  . Sulfonamide Derivatives Hives and  Rash   Social History   Social History  . Marital status: Divorced    Spouse name: N/A  . Number of children: N/A  . Years of education: N/A   Occupational History  . Not on file.   Social History Main Topics  . Smoking status: Former Smoker    Packs/day: 0.50    Years: 20.00    Types: Cigarettes    Quit date: 12/21/2010  . Smokeless tobacco: Never Used     Comment: smoking cessation info given and reviewed   . Alcohol use 0.0 oz/week     Comment: seldom  . Drug use: No  . Sexual activity: Yes    Birth control/ protection: Surgical   Other Topics Concern  . Not on file   Social History Narrative  . No narrative on file      Review of Systems  All other systems reviewed and are negative.  Objective:   Physical Exam  Constitutional: She appears well-developed and well-nourished. No distress.  HENT:  Mouth/Throat: No oropharyngeal exudate.  Eyes: Conjunctivae are normal.  Neck: Neck supple. No JVD present.  Cardiovascular: Normal rate, regular rhythm and normal heart sounds.   Pulmonary/Chest: No accessory muscle usage. No tachypnea. No respiratory distress. She has no decreased breath sounds. She has rhonchi in the right middle field, the right lower field, the left middle field and the left lower field.  Abdominal: Soft. Bowel sounds are normal. She exhibits no distension. There is no tenderness. There is no rebound and no guarding.  Musculoskeletal: She exhibits no edema.  Skin: She is not diaphoretic.  Vitals reviewed.         Assessment & Plan:  Community acquired pneumonia, unspecified laterality - Plan: azithromycin (ZITHROMAX) powder 1 g, cefUROXime (CEFTIN) 500 MG tablet, DG Chest 2 View  Patient has vertebral fracture at T6 that is new. I believe this is causing her mid back pain. As a result of this, the patient has not been taking deep inspirations. She has underlying moderate to severe COPD. I believe all these factors together have possibly  produce community-acquired pneumonia explaining her exam findings. I will the patient to go immediately and get a chest x-ray. Meanwhile begin Ceftin 500 mg by mouth twice a day for 10 days and a Z-Pak to cover possible community-acquired pneumonia. Discontinue Flexeril and Bentyl. She's been using these medicines sparingly anyway but in case sedation was the cause of her syncope on to clean up her medicine list. Increase levothyroxine to 50 g a day and recheck TSH in 6 weeks. I will see the patient back on Monday. She is to seek medical attention immediately if symptoms worsen. Also discussed with the patient possibly starting her back on bisphosphonate therapy. She would prefer Prolia. Given the 4 vertebral fractures that she is suffered, I believe that she would be an excellent candidate for Prolia to prevent future osteoporotic fractures. I will have the scheduled

## 2016-09-24 ENCOUNTER — Ambulatory Visit (INDEPENDENT_AMBULATORY_CARE_PROVIDER_SITE_OTHER): Payer: PPO | Admitting: Family Medicine

## 2016-09-24 ENCOUNTER — Encounter: Payer: Self-pay | Admitting: Family Medicine

## 2016-09-24 VITALS — BP 110/78 | HR 90 | Temp 97.9°F | Resp 90 | Ht 66.0 in | Wt 133.0 lb

## 2016-09-24 DIAGNOSIS — J441 Chronic obstructive pulmonary disease with (acute) exacerbation: Secondary | ICD-10-CM

## 2016-09-24 DIAGNOSIS — J189 Pneumonia, unspecified organism: Secondary | ICD-10-CM

## 2016-09-24 MED ORDER — ALBUTEROL SULFATE HFA 108 (90 BASE) MCG/ACT IN AERS: 2.0000 | INHALATION_SPRAY | Freq: Four times a day (QID) | RESPIRATORY_TRACT | 6 refills | Status: DC | PRN

## 2016-09-24 MED ORDER — PREDNISONE 20 MG PO TABS: ORAL_TABLET | ORAL | 0 refills | Status: DC

## 2016-09-24 NOTE — Progress Notes (Signed)
Subjective:    Patient ID: [name], female    DOB: [birth year], 75 y.o.   MRN: [id number]  HPI  09/21/16 Patient was recently admitted to the hospital after a possible syncopal episode. X-rays revealed a new compression fracture at T6, 3 old compression fracture seen in the thoracic spine. Otherwise workup was unremarkable. They recommended decreasing her Flexeril twice a day. TSH obtained in the hospital was elevated at 6.6. No other medication changes were made. She is here today for follow-up. Patient appears fatigued and tired. She reports pain in the middle of her back with inspiration. On examination today she has prominent bibasilar crackles. Recent x-ray in the hospital did show mild atelectasis however her exam findings or pronounced and do not seem consistent with atelectasis. It makes be concerned that due to possible poor inspiration secondary to her recent vertebral fracture, she may have developed a secondary pneumonia in the regions of atelectasis. She does report a cough productive of green mucus and worsening shortness of breath even though she is on 2 L. She quit bisphosphonate therapy under the direction of her dentist.  At that time, my plan was: Patient has vertebral fracture at T6 that is new. I believe this is causing her mid back pain. As a result of this, the patient has not been taking deep inspirations. She has underlying moderate to severe COPD. I believe all these factors together have possibly produce community-acquired pneumonia explaining her exam findings. I will the patient to go immediately and get a chest x-ray. Meanwhile begin Ceftin 500 mg by mouth twice a day for 10 days and a Z-Pak to cover possible community-acquired pneumonia. Discontinue Flexeril and Bentyl. She's been using these medicines sparingly anyway but in case sedation was the cause of her syncope on to clean up her medicine list. Increase levothyroxine to 50 g a day and recheck TSH in 6 weeks. I  will see the patient back on Monday. She is to seek medical attention immediately if symptoms worsen. Also discussed with the patient possibly starting her back on bisphosphonate therapy. She would prefer Prolia. Given the 4 vertebral fractures that she is suffered, I believe that she would be an excellent candidate for Prolia to prevent future osteoporotic fractures. I will have the scheduled  09/24/16 Chest x-ray did not show any definite infiltrate although it did show worsening atelectasis in the right lower lobe. However the patient's breathing is worsening. Today she is wheezing audibly on exam. She has diminished breath sounds. Pulse oximetry is 89% at rest on 2 L. She continues to have prominent bibasilar crackles right greater than left. She denies any chest pain although she does report bilateral pleurisy Past Medical History:  Diagnosis Date  . 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 Allergic rhinitis   . 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 Anxiety disorder   . Aortic aneurysm ([place])   . Complication of anesthesia 2011   bloodpressure dropped during colonoscopy, not problems since  . Compression fracture   . COPD (chronic obstructive pulmonary disease) ([place])   . Depression   . Diastolic dysfunction   . Diverticulosis   . Dysphagia   . Emphysema lung ([place]) 11/08/2014  . Hiatal hernia   . Hypothyroidism   . IBS (irritable bowel syndrome)   . On home O2 12/15/2013   chronic hypoxia  . PUD (peptic ulcer disease)    12yr  .  Shortness of breath    with exertion  . Trigeminal neuralgia    Past Surgical History:  Procedure Laterality Date  . ABDOMINAL HYSTERECTOMY    . BACK SURGERY  January 13, 2014  . CATARACT EXTRACTION    . CATARACT EXTRACTION W/PHACO  09/02/2012   Procedure: CATARACT EXTRACTION PHACO AND INTRAOCULAR LENS PLACEMENT (IOC);  Surgeon: Elta Guadeloupe T. Gershon Crane, MD;  Location: [facility];  Service: Ophthalmology;  Laterality:  Left;  CDE:10.35  . CHOLECYSTECTOMY    . 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 COLONOSCOPY N/A 11/23/2014   Procedure: COLONOSCOPY;  Surgeon: Danie Binder, MD;  [facility];  Service: Endoscopy;  Laterality: N/A;  930   . ENDARTERECTOMY Right 08/21/2013   Procedure: RIGHT CAROTID ANEURYSM RESECTION;  Surgeon: Rosetta Posner, MD;  [facility];  Service: Vascular;  Laterality: Right;  . ESOPHAGOGASTRODUODENOSCOPY  11/13/2009   w/dilation to 75m, gastric ulceration (H.Pylori) s/p treatment  . ESOPHAGOGASTRODUODENOSCOPY  11/21/2009   distal esophageal web, gastritis  . ESOPHAGOGASTRODUODENOSCOPY  03/14/12   SSEG:[id number] the distal esophagus/Mild gastritis/small HH. + H.pylori, prescribed Pylera. Finished treatment.   . fatty tumor removal from lt groin    . FRACTURE SURGERY Left    hand and left ring finger  . LUNG CANCER SURGERY  12/2010   Left VATS, minithoracotomy, LLL superior segmentectomy  . ORIF WRIST FRACTURE Left 04/09/2014   Procedure: OPEN REDUCTION INTERNAL FIXATION (ORIF) WRIST FRACTURE;  Surgeon: TRenette Butters MD;  [facility]  Service: Orthopedics;  Laterality: Left;  . PERCUTANEOUS PINNING Left 04/09/2014   Procedure: PERCUTANEOUS PINNING EXTREMITY;  Surgeon: TRenette Butters MD;  [place]  Service: Orthopedics;  Laterality: Left;  . TUBAL LIGATION    . vocal cord surgery  02/06/2011   laryngoscopy with bilateral vocal cord Radiesse injection for vocal cord paralysis  . YAG LASER APPLICATION Left 173/71/0626  Procedure: YAG LASER APPLICATION;  Surgeon: MRutherford Guys MD;  [facility];  Service: Ophthalmology;  Laterality: Left;   Current Outpatient Prescriptions on File Prior to Visit  Medication Sig Dispense Refill  . acetaminophen (TYLENOL) 650 MG CR tablet Take 1 tablet (650 mg total) by mouth every 8 (eight) hours as needed for pain. 30 tablet 0  . albuterol (PROVENTIL) (2.5 MG/3ML) 0.083% nebulizer  solution Take 3 mLs (2.5 mg total) by nebulization every 6 (six) hours as needed for wheezing or shortness of breath. 75 mL 12  . ALPRAZolam (XANAX) 0.5 MG tablet Take 1 tablet (0.5 mg total) by mouth 2 (two) times daily as needed for anxiety or sleep. 60 tablet 1  . AMITIZA 24 MCG capsule Take 48 mcg by mouth 2 (two) times daily with a meal.     . calcium citrate-vitamin D (CITRACAL+D) 315-200 MG-UNIT per tablet Take 1 tablet by mouth daily.     . cefUROXime (CEFTIN) 500 MG tablet Take 1 tablet (500 mg total) by mouth 2 (two) times daily with a meal. 20 tablet 0  . citalopram (CELEXA) 40 MG tablet Take 0.5 tablets (20 mg total) by mouth at bedtime. 90 tablet 4  . levothyroxine (SYNTHROID, LEVOTHROID) 50 MCG tablet Take 1 tablet (50 mcg total) by mouth daily before breakfast. 90 tablet 3  . Multiple Vitamin (MULTIVITAMIN WITH MINERALS) TABS tablet Take 1 tablet by mouth daily.    .Marland Kitchenomeprazole (PRILOSEC) 40 MG capsule Take 1 capsule (40 mg total) by mouth daily. 90 capsule  3  . traMADol (ULTRAM) 50 MG tablet Take 1 tablet (50 mg total) by mouth every 6 (six) hours as needed. 30 tablet 0  . umeclidinium-vilanterol (ANORO ELLIPTA) 62.5-25 MCG/INH AEPB Inhale 1 puff into the lungs daily. 90 each 3   No current facility-administered medications on file prior to visit.    Allergies  Allergen Reactions  . Ciprofloxacin Hcl Hives  . Iohexol      Desc: Per [facility], pt is allergic to IV contrast, no type of reaction was available. Pt cannot remember but first reacted around 15 yrs ago from an IVP. Notes were date from 2009 at urology center., [place] Date: [id number]   . Prozac [Fluoxetine Hcl] Rash  . Sulfonamide Derivatives Hives and Rash   Social History   Social History  . Marital status: Divorced    Spouse name: N/A  . Number of children: N/A  . Years of education: N/A   Occupational History  . Not on file.   Social History Main Topics  . Smoking status: Former Smoker     Packs/day: 0.50    Years: 20.00    Types: Cigarettes    Quit date: 12/21/2010  . Smokeless tobacco: Never Used     Comment: smoking cessation info given and reviewed   . Alcohol use 0.0 oz/week     Comment: seldom  . Drug use: No  . Sexual activity: Yes    Birth control/ protection: Surgical   Other Topics Concern  . Not on file   Social History Narrative  . No narrative on file      Review of Systems  All other systems reviewed and are negative.      Objective:   Physical Exam  Constitutional: She appears well-developed and well-nourished. No distress.  HENT:  Mouth/Throat: No oropharyngeal exudate.  Eyes: Conjunctivae are normal.  Neck: Neck supple. No JVD present.  Cardiovascular: Normal rate, regular rhythm and normal heart sounds.   Pulmonary/Chest: No accessory muscle usage. No tachypnea. No respiratory distress. She has no decreased breath sounds. She has wheezes. She has rhonchi in the right middle field, the right lower field, the left middle field and the left lower field.  Abdominal: Soft. Bowel sounds are normal. She exhibits no distension. There is no tenderness. There is no rebound and no guarding.  Musculoskeletal: She exhibits no edema.  Skin: She is not diaphoretic.  Vitals reviewed.         Assessment & Plan:  Community acquired pneumonia, unspecified laterality  COPD exacerbation ([place])  Continue Ceftin and azithromycin. Supplement with prednisone taper pack as I believe she is now having COPD exacerbation on top of the community-acquired pneumonia. Add albuterol 2 puffs inhaled every 6 hours. Recheck here on Friday or immediately if worse. She can also go to the hospital immediately if worse

## 2016-09-26 ENCOUNTER — Encounter: Payer: Self-pay | Admitting: Family Medicine

## 2016-09-28 ENCOUNTER — Ambulatory Visit (INDEPENDENT_AMBULATORY_CARE_PROVIDER_SITE_OTHER): Payer: PPO | Admitting: Family Medicine

## 2016-09-28 ENCOUNTER — Encounter: Payer: Self-pay | Admitting: Family Medicine

## 2016-09-28 VITALS — BP 112/84 | HR 94 | Temp 97.8°F | Resp 20 | Ht 66.0 in

## 2016-09-28 DIAGNOSIS — J441 Chronic obstructive pulmonary disease with (acute) exacerbation: Secondary | ICD-10-CM | POA: Diagnosis not present

## 2016-09-28 DIAGNOSIS — J189 Pneumonia, unspecified organism: Secondary | ICD-10-CM

## 2016-09-28 NOTE — Progress Notes (Signed)
Subjective:    Patient ID: [name], female    DOB: [birth year], 75 y.o.   MRN: [id number]  HPI  09/21/16 Patient was recently admitted to the hospital after a possible syncopal episode. X-rays revealed a new compression fracture at T6, 3 old compression fracture seen in the thoracic spine. Otherwise workup was unremarkable. They recommended decreasing her Flexeril twice a day. TSH obtained in the hospital was elevated at 6.6. No other medication changes were made. She is here today for follow-up. Patient appears fatigued and tired. She reports pain in the middle of her back with inspiration. On examination today she has prominent bibasilar crackles. Recent x-ray in the hospital did show mild atelectasis however her exam findings or pronounced and do not seem consistent with atelectasis. It makes be concerned that due to possible poor inspiration secondary to her recent vertebral fracture, she may have developed a secondary pneumonia in the regions of atelectasis. She does report a cough productive of green mucus and worsening shortness of breath even though she is on 2 L. She quit bisphosphonate therapy under the direction of her dentist.  At that time, my plan was: Patient has vertebral fracture at T6 that is new. I believe this is causing her mid back pain. As a result of this, the patient has not been taking deep inspirations. She has underlying moderate to severe COPD. I believe all these factors together have possibly produce community-acquired pneumonia explaining her exam findings. I will the patient to go immediately and get a chest x-ray. Meanwhile begin Ceftin 500 mg by mouth twice a day for 10 days and a Z-Pak to cover possible community-acquired pneumonia. Discontinue Flexeril and Bentyl. She's been using these medicines sparingly anyway but in case sedation was the cause of her syncope on to clean up her medicine list. Increase levothyroxine to 50 g a day and recheck TSH in 6 weeks. I  will see the patient back on Monday. She is to seek medical attention immediately if symptoms worsen. Also discussed with the patient possibly starting her back on bisphosphonate therapy. She would prefer Prolia. Given the 4 vertebral fractures that she is suffered, I believe that she would be an excellent candidate for Prolia to prevent future osteoporotic fractures. I will have the scheduled  09/24/16 Chest x-ray did not show any definite infiltrate although it did show worsening atelectasis in the right lower lobe. However the patient's breathing is worsening. Today she is wheezing audibly on exam. She has diminished breath sounds. Pulse oximetry is 89% at rest on 2 L. She continues to have prominent bibasilar crackles right greater than left. She denies any chest pain although she does report bilateral pleurisy.   At that time, my plan was: Continue Ceftin and azithromycin. Supplement with prednisone taper pack as I believe she is now having COPD exacerbation on top of the community-acquired pneumonia. Add albuterol 2 puffs inhaled every 6 hours. Recheck here on Friday or immediately if worse. She can also go to the hospital immediately if worse  09/28/16 Patient's breathing is much better. Pulse oximetry has gone from 89% on 2 L up to 93% on 2 L. Her cough is no longer productive of green or yellow sputum. The cough has improved dramatically and her shortness of breath is better. On examination there are no longer any expiratory wheezes. The right basilar crackles disappeared. Past Medical History:  Diagnosis Date  . 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 Allergic  rhinitis   . 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 Anxiety disorder   . Aortic aneurysm ([place])   . Complication of anesthesia 2011   bloodpressure dropped during colonoscopy, not problems since  . Compression fracture   . COPD (chronic obstructive pulmonary disease) ([place])   .  Depression   . Diastolic dysfunction   . Diverticulosis   . Dysphagia   . Emphysema lung ([place]) 11/08/2014  . Hiatal hernia   . Hypothyroidism   . IBS (irritable bowel syndrome)   . On home O2 12/15/2013   chronic hypoxia  . PUD (peptic ulcer disease)    27yr  . Shortness of breath    with exertion  . Trigeminal neuralgia    Past Surgical History:  Procedure Laterality Date  . ABDOMINAL HYSTERECTOMY    . BACK SURGERY  January 13, 2014  . CATARACT EXTRACTION    . CATARACT EXTRACTION W/PHACO  09/02/2012   Procedure: CATARACT EXTRACTION PHACO AND INTRAOCULAR LENS PLACEMENT (IOC);  Surgeon: MElta GuadeloupeT. SGershon Crane MD;  Location: [facility];  Service: Ophthalmology;  Laterality: Left;  CDE:10.35  . CHOLECYSTECTOMY    . 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 COLONOSCOPY N/A 11/23/2014   Procedure: COLONOSCOPY;  Surgeon: SDanie Binder MD;  Location: [facility];  Service: Endoscopy;  Laterality: N/A;  930   . ENDARTERECTOMY Right 08/21/2013   Procedure: RIGHT CAROTID ANEURYSM RESECTION;  Surgeon: TRosetta Posner MD;  [facility];  Service: Vascular;  Laterality: Right;  . ESOPHAGOGASTRODUODENOSCOPY  11/13/2009   w/dilation to 145m gastric ulceration (H.Pylori) s/p treatment  . ESOPHAGOGASTRODUODENOSCOPY  11/21/2009   distal esophageal web, gastritis  . ESOPHAGOGASTRODUODENOSCOPY  03/14/12   [id number] the distal esophagus/Mild gastritis/small HH. + H.pylori, prescribed Pylera. Finished treatment.   . fatty tumor removal from lt groin    . FRACTURE SURGERY Left    hand and left ring finger  . LUNG CANCER SURGERY  12/2010   Left VATS, minithoracotomy, LLL superior segmentectomy  . ORIF WRIST FRACTURE Left 04/09/2014   Procedure: OPEN REDUCTION INTERNAL FIXATION (ORIF) WRIST FRACTURE;  Surgeon: TiRenette ButtersMD;  [facility] Service: Orthopedics;  Laterality: Left;  . PERCUTANEOUS PINNING Left 04/09/2014   Procedure: PERCUTANEOUS PINNING EXTREMITY;   Surgeon: TiRenette ButtersMD;  [facility];  Laterality: Left;  . TUBAL LIGATION    . vocal cord surgery  02/06/2011   laryngoscopy with bilateral vocal cord Radiesse injection for vocal cord paralysis  . YAG LASER APPLICATION Left 1281/19/1478 Procedure: YAG LASER APPLICATION;  Surgeon: MaRutherford GuysMD;  Location: [facility];  Service: Ophthalmology;  Laterality: Left;   Current Outpatient Prescriptions on File Prior to Visit  Medication Sig Dispense Refill  . acetaminophen (TYLENOL) 650 MG CR tablet Take 1 tablet (650 mg total) by mouth every 8 (eight) hours as needed for pain. 30 tablet 0  . albuterol (PROVENTIL HFA;VENTOLIN HFA) 108 (90 Base) MCG/ACT inhaler Inhale 2 puffs into the lungs every 6 (six) hours as needed for wheezing or shortness of breath. 1 Inhaler 6  . albuterol (PROVENTIL) (2.5 MG/3ML) 0.083% nebulizer solution Take 3 mLs (2.5 mg total) by nebulization every 6 (six) hours as needed for wheezing or shortness of breath. 75 mL 12  . ALPRAZolam (XANAX) 0.5 MG tablet Take 1 tablet (0.5 mg total) by mouth 2 (two) times daily as needed  for anxiety or sleep. 60 tablet 1  . AMITIZA 24 MCG capsule Take 48 mcg by mouth 2 (two) times daily with a meal.     . calcium citrate-vitamin D (CITRACAL+D) 315-200 MG-UNIT per tablet Take 1 tablet by mouth daily.     . cefUROXime (CEFTIN) 500 MG tablet Take 1 tablet (500 mg total) by mouth 2 (two) times daily with a meal. 20 tablet 0  . citalopram (CELEXA) 40 MG tablet Take 0.5 tablets (20 mg total) by mouth at bedtime. 90 tablet 4  . levothyroxine (SYNTHROID, LEVOTHROID) 50 MCG tablet Take 1 tablet (50 mcg total) by mouth daily before breakfast. 90 tablet 3  . Multiple Vitamin (MULTIVITAMIN WITH MINERALS) TABS tablet Take 1 tablet by mouth daily.    Marland Kitchen omeprazole (PRILOSEC) 40 MG capsule Take 1 capsule (40 mg total) by mouth daily. 90 capsule 3  . predniSONE (DELTASONE) 20 MG tablet 3 tabs poqday 1-2, 2 tabs poqday 3-4, 1  tab poqday 5-6 12 tablet 0  . traMADol (ULTRAM) 50 MG tablet Take 1 tablet (50 mg total) by mouth every 6 (six) hours as needed. 30 tablet 0  . umeclidinium-vilanterol (ANORO ELLIPTA) 62.5-25 MCG/INH AEPB Inhale 1 puff into the lungs daily. 90 each 3   No current facility-administered medications on file prior to visit.    Allergies  Allergen Reactions  . Ciprofloxacin Hcl Hives  . Iohexol      Desc: Per [facility], pt is allergic to IV contrast, no type of reaction was available. Pt cannot remember but first reacted around 15 yrs ago from an IVP. Notes were date from 2009 at urology center., [place] Date: [id number]   . Prozac [Fluoxetine Hcl] Rash  . Sulfonamide Derivatives Hives and Rash   Social History   Social History  . Marital status: Divorced    Spouse name: N/A  . Number of children: N/A  . Years of education: N/A   Occupational History  . Not on file.   Social History Main Topics  . Smoking status: Former Smoker    Packs/day: 0.50    Years: 20.00    Types: Cigarettes    Quit date: 12/21/2010  . Smokeless tobacco: Never Used     Comment: smoking cessation info given and reviewed   . Alcohol use 0.0 oz/week     Comment: seldom  . Drug use: No  . Sexual activity: Yes    Birth control/ protection: Surgical   Other Topics Concern  . Not on file   Social History Narrative  . No narrative on file      Review of Systems  All other systems reviewed and are negative.      Objective:   Physical Exam  Constitutional: She appears well-developed and well-nourished. No distress.  HENT:  Mouth/Throat: No oropharyngeal exudate.  Eyes: Conjunctivae are normal.  Neck: Neck supple. No JVD present.  Cardiovascular: Normal rate, regular rhythm and normal heart sounds.   Pulmonary/Chest: No accessory muscle usage. No tachypnea. No respiratory distress. She has no decreased breath sounds. She has no wheezes. She has no rhonchi. She has no rales.  Abdominal: Soft.  Bowel sounds are normal. She exhibits no distension. There is no tenderness. There is no rebound and no guarding.  Musculoskeletal: She exhibits no edema.  Skin: She is not diaphoretic.  Vitals reviewed.         Assessment & Plan:  Community acquired pneumonia, unspecified laterality  COPD exacerbation ([place])  Clinically the patient is much  better today than she was. Finished prednisone and antibiotics. Use albuterol every 6 hours only as needed. I will schedule the patient for Prolia given her history of osteoporosis and multiple vertebral fractures

## 2016-10-02 ENCOUNTER — Encounter: Payer: Self-pay | Admitting: Family Medicine

## 2016-10-03 ENCOUNTER — Encounter: Payer: Self-pay | Admitting: Family Medicine

## 2016-10-03 ENCOUNTER — Other Ambulatory Visit: Payer: Self-pay | Admitting: Family Medicine

## 2016-10-03 NOTE — Telephone Encounter (Signed)
Ok to refill 

## 2016-10-03 NOTE — Telephone Encounter (Signed)
ok 

## 2016-10-04 ENCOUNTER — Encounter: Payer: Self-pay | Admitting: Family Medicine

## 2016-10-04 ENCOUNTER — Other Ambulatory Visit: Payer: Self-pay | Admitting: Family Medicine

## 2016-10-04 DIAGNOSIS — R0602 Shortness of breath: Secondary | ICD-10-CM | POA: Diagnosis not present

## 2016-10-04 DIAGNOSIS — C3490 Malignant neoplasm of unspecified part of unspecified bronchus or lung: Secondary | ICD-10-CM | POA: Diagnosis not present

## 2016-10-04 DIAGNOSIS — R0902 Hypoxemia: Secondary | ICD-10-CM | POA: Diagnosis not present

## 2016-10-04 DIAGNOSIS — J9621 Acute and chronic respiratory failure with hypoxia: Secondary | ICD-10-CM | POA: Diagnosis not present

## 2016-10-04 MED ORDER — ALPRAZOLAM 0.5 MG PO TABS: 0.5000 mg | ORAL_TABLET | Freq: Two times a day (BID) | ORAL | 1 refills | Status: DC | PRN

## 2016-10-09 ENCOUNTER — Encounter: Payer: Self-pay | Admitting: Family Medicine

## 2016-10-11 ENCOUNTER — Encounter: Payer: Self-pay | Admitting: Family Medicine

## 2016-10-13 ENCOUNTER — Other Ambulatory Visit: Payer: Self-pay | Admitting: Family Medicine

## 2016-10-15 ENCOUNTER — Encounter: Payer: Self-pay | Admitting: Family Medicine

## 2016-10-16 ENCOUNTER — Encounter: Payer: Self-pay | Admitting: Family Medicine

## 2016-10-19 ENCOUNTER — Encounter (HOSPITAL_COMMUNITY): Payer: Self-pay | Admitting: Emergency Medicine

## 2016-10-19 ENCOUNTER — Observation Stay (HOSPITAL_COMMUNITY)
Admission: EM | Admit: 2016-10-19 | Discharge: 2016-10-20 | Disposition: A | Discharge status: Home / Self Care | Payer: PPO | Attending: Internal Medicine | Admitting: Internal Medicine

## 2016-10-19 ENCOUNTER — Emergency Department (HOSPITAL_COMMUNITY): Payer: PPO

## 2016-10-19 DIAGNOSIS — F419 Anxiety disorder, unspecified: Secondary | ICD-10-CM | POA: Insufficient documentation

## 2016-10-19 DIAGNOSIS — I714 Abdominal aortic aneurysm, without rupture: Secondary | ICD-10-CM | POA: Insufficient documentation

## 2016-10-19 DIAGNOSIS — J449 Chronic obstructive pulmonary disease, unspecified: Secondary | ICD-10-CM | POA: Insufficient documentation

## 2016-10-19 DIAGNOSIS — Z888 Allergy status to other drugs, medicaments and biological substances status: Secondary | ICD-10-CM | POA: Diagnosis not present

## 2016-10-19 DIAGNOSIS — Z9981 Dependence on supplemental oxygen: Secondary | ICD-10-CM | POA: Diagnosis not present

## 2016-10-19 DIAGNOSIS — Z9842 Cataract extraction status, left eye: Secondary | ICD-10-CM | POA: Insufficient documentation

## 2016-10-19 DIAGNOSIS — E871 Hypo-osmolality and hyponatremia: Secondary | ICD-10-CM | POA: Insufficient documentation

## 2016-10-19 DIAGNOSIS — G500 Trigeminal neuralgia: Secondary | ICD-10-CM | POA: Insufficient documentation

## 2016-10-19 DIAGNOSIS — I110 Hypertensive heart disease with heart failure: Secondary | ICD-10-CM | POA: Insufficient documentation

## 2016-10-19 DIAGNOSIS — K589 Irritable bowel syndrome without diarrhea: Secondary | ICD-10-CM | POA: Diagnosis not present

## 2016-10-19 DIAGNOSIS — R0602 Shortness of breath: Secondary | ICD-10-CM | POA: Diagnosis not present

## 2016-10-19 DIAGNOSIS — Z9889 Other specified postprocedural states: Secondary | ICD-10-CM | POA: Insufficient documentation

## 2016-10-19 DIAGNOSIS — Z881 Allergy status to other antibiotic agents status: Secondary | ICD-10-CM | POA: Insufficient documentation

## 2016-10-19 DIAGNOSIS — K449 Diaphragmatic hernia without obstruction or gangrene: Secondary | ICD-10-CM | POA: Insufficient documentation

## 2016-10-19 DIAGNOSIS — I5032 Chronic diastolic (congestive) heart failure: Secondary | ICD-10-CM | POA: Diagnosis not present

## 2016-10-19 DIAGNOSIS — E860 Dehydration: Secondary | ICD-10-CM | POA: Insufficient documentation

## 2016-10-19 DIAGNOSIS — Z882 Allergy status to sulfonamides status: Secondary | ICD-10-CM | POA: Diagnosis not present

## 2016-10-19 DIAGNOSIS — Z85118 Personal history of other malignant neoplasm of bronchus and lung: Secondary | ICD-10-CM | POA: Diagnosis not present

## 2016-10-19 DIAGNOSIS — Z8711 Personal history of peptic ulcer disease: Secondary | ICD-10-CM | POA: Insufficient documentation

## 2016-10-19 DIAGNOSIS — Z86010 Personal history of colonic polyps: Secondary | ICD-10-CM | POA: Insufficient documentation

## 2016-10-19 DIAGNOSIS — F329 Major depressive disorder, single episode, unspecified: Secondary | ICD-10-CM | POA: Insufficient documentation

## 2016-10-19 DIAGNOSIS — A084 Viral intestinal infection, unspecified: Principal | ICD-10-CM | POA: Insufficient documentation

## 2016-10-19 DIAGNOSIS — Z87891 Personal history of nicotine dependence: Secondary | ICD-10-CM | POA: Diagnosis not present

## 2016-10-19 DIAGNOSIS — E039 Hypothyroidism, unspecified: Secondary | ICD-10-CM | POA: Insufficient documentation

## 2016-10-19 LAB — CBC WITH DIFFERENTIAL/PLATELET
Basophils Absolute: 0 10*3/uL (ref 0.0–0.1)
Basophils Relative: 1 %
Eosinophils Absolute: 0.1 10*3/uL (ref 0.0–0.7)
Eosinophils Relative: 1 %
HCT: 31.1 % — ABNORMAL LOW (ref 36.0–46.0)
Hemoglobin: 10.2 g/dL — ABNORMAL LOW (ref 12.0–15.0)
Lymphocytes Relative: 13 %
Lymphs Abs: 0.9 10*3/uL (ref 0.7–4.0)
MCH: 24.8 pg — ABNORMAL LOW (ref 26.0–34.0)
MCHC: 32.8 g/dL (ref 30.0–36.0)
MCV: 75.5 fL — ABNORMAL LOW (ref 78.0–100.0)
Monocytes Absolute: 0.5 10*3/uL (ref 0.1–1.0)
Monocytes Relative: 7 %
Neutro Abs: 5 10*3/uL (ref 1.7–7.7)
Neutrophils Relative %: 78 %
Platelets: 399 10*3/uL (ref 150–400)
RBC: 4.12 MIL/uL (ref 3.87–5.11)
RDW: 16.6 % — ABNORMAL HIGH (ref 11.5–15.5)
WBC: 6.5 10*3/uL (ref 4.0–10.5)

## 2016-10-19 LAB — COMPREHENSIVE METABOLIC PANEL
ALT: 17 U/L (ref 14–54)
AST: 21 U/L (ref 15–41)
Albumin: 4.1 g/dL (ref 3.5–5.0)
Alkaline Phosphatase: 82 U/L (ref 38–126)
Anion gap: 8 (ref 5–15)
BUN: 10 mg/dL (ref 6–20)
CO2: 27 mmol/L (ref 22–32)
Calcium: 9.4 mg/dL (ref 8.9–10.3)
Chloride: 91 mmol/L — ABNORMAL LOW (ref 101–111)
Creatinine, Ser: 0.43 mg/dL — ABNORMAL LOW (ref 0.44–1.00)
GFR calc Af Amer: >60 mL/min (ref >60)
GFR calc non Af Amer: >60 mL/min (ref >60)
Glucose, Bld: 125 mg/dL — ABNORMAL HIGH (ref 65–99)
Potassium: 3.9 mmol/L (ref 3.5–5.1)
Sodium: 126 mmol/L — ABNORMAL LOW (ref 135–145)
Total Bilirubin: 0.2 mg/dL — ABNORMAL LOW (ref 0.3–1.2)
Total Protein: 7 g/dL (ref 6.5–8.1)

## 2016-10-19 LAB — TROPONIN I: Troponin I: <0.03 ng/mL (ref <0.03)

## 2016-10-19 MED ORDER — ONDANSETRON HCL 4 MG/2ML IJ SOLN
4.0000 mg | Freq: Once | INTRAMUSCULAR | Status: AC
  Administered 2016-10-19: 4 mg via INTRAVENOUS
  Filled 2016-10-19: qty 2

## 2016-10-19 MED ORDER — ALBUTEROL SULFATE (2.5 MG/3ML) 0.083% IN NEBU
5.0000 mg | INHALATION_SOLUTION | Freq: Once | RESPIRATORY_TRACT | Status: AC
Start: 2016-10-19 — End: 2016-10-19
  Administered 2016-10-19: 5 mg via RESPIRATORY_TRACT
  Filled 2016-10-19: qty 6

## 2016-10-19 MED ORDER — SODIUM CHLORIDE 0.9 % IV BOLUS (SEPSIS)
1000.0000 mL | Freq: Once | INTRAVENOUS | Status: AC
  Administered 2016-10-19: 1000 mL via INTRAVENOUS

## 2016-10-19 MED ORDER — IPRATROPIUM BROMIDE 0.02 % IN SOLN
0.5000 mg | RESPIRATORY_TRACT | Status: AC
  Administered 2016-10-19: 0.5 mg via RESPIRATORY_TRACT
  Filled 2016-10-19: qty 2.5

## 2016-10-19 MED ORDER — ONDANSETRON HCL 4 MG/2ML IJ SOLN
4.0000 mg | Freq: Once | INTRAMUSCULAR | Status: AC | PRN
  Administered 2016-10-19: 4 mg via INTRAVENOUS
  Filled 2016-10-19: qty 2

## 2016-10-19 NOTE — ED Provider Notes (Signed)
[facility] DEPT Provider Note   CSN: [id number] Arrival date & time: 10/19/16  1947   By signing my name below, I, Charolotte Eke, attest that this documentation has been prepared under the direction and in the presence of Noemi Chapel, MD. Electronically Signed: Charolotte Eke, Scribe. 10/19/16. 8:42 PM.   History   Chief Complaint Chief Complaint  Patient presents with  . Shortness of Breath    HPI [name] is a 76 y.o. female with h/o COPD and CA who presents to the Emergency Department complaining of worsening, gradual, weakness that began since her last hospitalization on 09/10/16 from a compression fracture. Pt has associated dry heaves, hot flashes, and shortness of breath. She is on 2 lpm Oxygen at home all the time. 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 She denies fevers, chills, and leg swelling. She is a former smoker.  The history is provided by the patient and a relative. No language interpreter was used.    Past Medical History:  Diagnosis Date  . Adenocarcinoma of lung ([place]) 12/2010   left lung/surg only  . Allergic rhinitis   . 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 Anxiety disorder   . Aortic aneurysm ([place])   . Complication of anesthesia 2011   bloodpressure dropped during colonoscopy, not problems since  . Compression fracture   . COPD (chronic obstructive pulmonary disease) ([place])   . Depression   . Diastolic dysfunction   . Diverticulosis   . Dysphagia   . Emphysema lung ([place]) 11/08/2014  . Hiatal hernia   . Hypothyroidism   . IBS (irritable bowel syndrome)   . On home O2 12/15/2013   chronic hypoxia  . PUD (peptic ulcer disease)    45yr  . Shortness of breath    with exertion  . Trigeminal neuralgia     Patient Active Problem List   Diagnosis Date Noted  . Polypharmacy 09/14/2016  . Muscle weakness (generalized)   . Syncope 09/10/2016  . Rib  pain on left side   . Hypersomnia 02/16/2016  . Acute bronchitis 02/16/2016  . Exertional dyspnea 12/20/2015  . HLD (hyperlipidemia) 11/23/2014  . Change in bowel habits 11/10/2014  . Abdominal pain, left lower quadrant 11/10/2014  . Emphysema lung ([facility] 11/08/2014  . Helicobacter pylori gastritis 07/30/2014  . Acute on chronic respiratory failure with hypoxia ([facility] 04/10/2014  . Wrist fracture 04/09/2014  . 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 T12 burst fracture ([place] 01/13/2014  . Constipation 12/14/2013  . Acute respiratory failure with hypoxia ([facility] 12/14/2013  . Hyponatremia 12/14/2013  . COPD (chronic obstructive pulmonary disease) ([place] 12/14/2013  . Compression fracture of thoracic vertebra ([facility] 12/14/2013  . Hypoxia 12/13/2013  . Compression fracture 12/13/2013  . Diastolic dysfunction   . 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 Dyspepsia 02/11/2012  . Aneurysm ([facility] 02/11/2012  . Trigeminal neuralgia   . Epigastric pain 02/21/2011  . Esophageal dysphagia 02/21/2011  . Adenocarcinoma of lung ([facility] 12/07/2010  . CONTRAST DYE ALLERGY 12/30/2009    Past Surgical History:  Procedure Laterality Date  . ABDOMINAL HYSTERECTOMY    . BACK SURGERY  January 13, 2014  . CATARACT EXTRACTION    . CATARACT EXTRACTION W/PHACO  09/02/2012   Procedure: CATARACT EXTRACTION PHACO AND INTRAOCULAR LENS PLACEMENT (  [place]);  Surgeon: Elta Guadeloupe T. Gershon Crane, MD;  Location: [facility];  Service: Ophthalmology;  Laterality: Left;  CDE:10.35  . CHOLECYSTECTOMY    . 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 COLONOSCOPY N/A 11/23/2014   Procedure: COLONOSCOPY;  Surgeon: Danie Binder, MD;  Location: [facility] SUITE;  Service: Endoscopy;  Laterality: N/A;  930   . ENDARTERECTOMY Right 08/21/2013   Procedure: RIGHT CAROTID ANEURYSM RESECTION;  Surgeon: Rosetta Posner, MD;  Location: [facility] OR;  Service: Vascular;  Laterality: Right;  . ESOPHAGOGASTRODUODENOSCOPY  11/13/2009   w/dilation to 55m, gastric ulceration (H.Pylori) s/p treatment  . ESOPHAGOGASTRODUODENOSCOPY  11/21/2009   distal esophageal web, gastritis  . ESOPHAGOGASTRODUODENOSCOPY  03/14/12   SION:[id number] the distal esophagus/Mild gastritis/small HH. + H.pylori, prescribed Pylera. Finished treatment.   . fatty tumor removal from lt groin    . FRACTURE SURGERY Left    hand and left ring finger  . LUNG CANCER SURGERY  12/2010   Left VATS, minithoracotomy, LLL superior segmentectomy  . ORIF WRIST FRACTURE Left 04/09/2014   Procedure: OPEN REDUCTION INTERNAL FIXATION (ORIF) WRIST FRACTURE;  Surgeon: TRenette Butters MD;  [facility]  Service: Orthopedics;  Laterality: Left;  . PERCUTANEOUS PINNING Left 04/09/2014   Procedure: PERCUTANEOUS PINNING EXTREMITY;  Surgeon: TRenette Butters MD;  [facility]  Service: Orthopedics;  Laterality: Left;  . TUBAL LIGATION    . vocal cord surgery  02/06/2011   laryngoscopy with bilateral vocal cord Radiesse injection for vocal cord paralysis  . YAG LASER APPLICATION Left 124/40/1027  Procedure: YAG LASER APPLICATION;  Surgeon: MRutherford Guys MD;  Location: [facility];  Service: Ophthalmology;  Laterality: Left;    OB History    Gravida Para Term Preterm AB Living   '2 2 2     2   '$ SAB TAB Ectopic Multiple Live Births                   Home Medications    Prior to Admission medications   Medication Sig Start Date End Date Taking? Authorizing Provider  acetaminophen (TYLENOL) 650 MG CR tablet Take 1 tablet (650 mg total) by mouth every 8 (eight) hours as needed for pain. Patient taking differently: Take 500-650 mg by mouth every 8 (eight) hours as needed for pain.  09/13/16  Yes FFlorencia Reasons MD  albuterol (PROVENTIL HFA;VENTOLIN HFA) 108 (90 Base) MCG/ACT inhaler Inhale 2 puffs into the lungs every 6 (six) hours as needed for wheezing or shortness of  breath. 09/24/16  Yes WSusy Frizzle MD  albuterol (PROVENTIL) (2.5 MG/3ML) 0.083% nebulizer solution Take 3 mLs (2.5 mg total) by nebulization every 6 (six) hours as needed for wheezing or shortness of breath. 03/01/16  Yes BNat Christen MD  ALPRAZolam (Duanne Moron 0.5 MG tablet Take 1 tablet (0.5 mg total) by mouth 2 (two) times daily as needed for anxiety or sleep. Patient taking differently: Take 0.5 mg by mouth at bedtime as needed for anxiety or sleep.  10/04/16  Yes WSusy Frizzle MD  calcium citrate-vitamin D (CITRACAL+D) 315-200 MG-UNIT per tablet Take 1 tablet by mouth daily.    Yes Historical Provider, MD  citalopram (CELEXA) 40 MG tablet Take 0.5 tablets (20 mg total) by mouth at bedtime. 09/14/16  Yes Nishant Dhungel, MD  levothyroxine (SYNTHROID, LEVOTHROID) 50 MCG tablet Take 1 tablet (50 mcg total) by mouth daily before breakfast. 09/21/16  Yes WSusy Frizzle MD  Multiple Vitamin (MULTIVITAMIN WITH MINERALS) TABS tablet Take 1 tablet by mouth daily.   Yes Historical Provider, MD  omeprazole (PRILOSEC) 40 MG capsule Take 1 capsule (40 mg total) by mouth daily. 06/14/16  Yes [name], MD  Oxcarbazepine (TRILEPTAL) 300 MG tablet Take 300 mg by mouth 2 (two) times daily. 09/22/16  Yes Historical Provider, MD  traMADol (ULTRAM) 50 MG tablet TAKE (1) TABLET BY MOUTH EVERY 6 HOURS AS NEEDED. 10/04/16  Yes [name], MD  umeclidinium-vilanterol [facility] ELLIPTA) 62.5-25 MCG/INH AEPB Inhale 1 puff into the lungs daily. 05/08/16  Yes [name], MD  cefUROXime (CEFTIN) 500 MG tablet Take 1 tablet (500 mg total) by mouth 2 (two) times daily with a meal. Patient not taking: Reported on 10/19/2016 09/21/16   [name], MD    Family History Family History  Problem Relation Age of Onset  . Pulmonary embolism Mother   . Heart attack Father   . Hypertension Father   . Liver cancer Sister   . Cancer Sister   . Cancer Brother   . Cancer Daughter   . Colon cancer Neg Hx      Social History Social History  Substance Use Topics  . Smoking status: Former Smoker    Packs/day: 0.50    Years: 20.00    Types: Cigarettes    Quit date: 12/21/2010  . Smokeless tobacco: Never Used     Comment: smoking cessation info given and reviewed   . Alcohol use 0.0 oz/week     Comment: seldom     Allergies   Ciprofloxacin hcl; Iohexol; Prozac [fluoxetine hcl]; and Sulfonamide derivatives   Review of Systems Review of Systems  Constitutional: Negative for chills and fever.       Hot flashes.  Respiratory: Positive for shortness of breath.   Cardiovascular: Negative for leg swelling.  Neurological: Positive for weakness.  All other systems reviewed and are negative.    Physical Exam Updated Vital Signs BP 151/86 (BP Location: Left Arm)   Pulse 82   Temp 98.5 F (36.9 C) (Oral)   Resp 23   Ht '5\' 5"'$  (1.651 m)   Wt 132 lb (59.9 kg)   SpO2 93%   BMI 21.97 kg/m   Physical Exam  Constitutional: She appears well-developed and well-nourished. No distress.  HENT:  Head: Normocephalic and atraumatic.  Mouth/Throat: Oropharynx is clear and moist. No oropharyngeal exudate.  Clear oropharynx.  Eyes: Conjunctivae and EOM are normal. Pupils are equal, round, and reactive to light. Right eye exhibits no discharge. Left eye exhibits no discharge. No scleral icterus.  Normal conjunctiva.  Neck: Normal range of motion. Neck supple. No JVD present. No thyromegaly present.  Cardiovascular: Normal rate and regular rhythm.   Pulmonary/Chest: She has wheezes.  Expiratory wheezing all lung fields. Rales bilaterally. She speaks in single long one breath sentences without use of accessory muscles.  Abdominal: Soft. Bowel sounds are normal. She exhibits no distension and no mass. There is no tenderness.  Musculoskeletal: Normal range of motion. She exhibits no edema or tenderness.  Lymphadenopathy:    She has no cervical adenopathy.  Neurological: She is alert.  Coordination normal.  Skin: Skin is warm and dry. No rash noted. No erythema.  Psychiatric: She has a normal mood and affect. Her behavior is normal.  Nursing note and vitals reviewed.    ED Treatments / Results   DIAGNOSTIC STUDIES: Oxygen Saturation is 93% on [place] at 2 lpm, low by my  interpretation.    COORDINATION OF CARE: 8:39 PM Discussed treatment plan with pt at bedside and pt agreed to plan.   Labs (all labs ordered are listed, but only abnormal results are displayed) Labs Reviewed  CBC WITH DIFFERENTIAL/PLATELET - Abnormal; Notable for the following:       Result Value   Hemoglobin 10.2 (*)    HCT 31.1 (*)    MCV 75.5 (*)    MCH 24.8 (*)    RDW 16.6 (*)    All other components within normal limits  COMPREHENSIVE METABOLIC PANEL - Abnormal; Notable for the following:    Sodium 126 (*)    Chloride 91 (*)    Glucose, Bld 125 (*)    Creatinine, Ser 0.43 (*)    Total Bilirubin 0.2 (*)    All other components within normal limits  TROPONIN I  URINALYSIS, ROUTINE W REFLEX MICROSCOPIC    EKG  EKG Interpretation  Date/Time:  Friday October 19 2016 20:19:13 EST Ventricular Rate:  85 PR Interval:    QRS Duration: 97 QT Interval:  396 QTC Calculation: 471 R Axis:   -49 Text Interpretation:  Sinus rhythm LAD, consider left anterior fascicular block Anterior infarct, old since last tracing no significant change Confirmed by Teneisha Gignac  MD, Malloree Raboin ([id number]) on 10/19/2016 9:08:21 PM       Radiology Dg Chest 2 View  Result Date: 10/19/2016 CLINICAL DATA:  Increased shortness of breath with weakness today. History of lung cancer. EXAM: CHEST  2 VIEW COMPARISON:  09/21/2016 FINDINGS: Shallow inspiration with underlying emphysematous changes. Linear fibrosis or atelectasis in the lung bases similar previous study. Normal heart size and pulmonary vascularity. No focal airspace disease or consolidation the lungs. Surgical clips in the left mid lung. No blunting of costophrenic  angles. No pneumothorax. Calcified and tortuous aorta. Esophageal hiatal hernia behind the heart. Postoperative changes in the thoracolumbar spine. Multiple thoracic vertebral compression deformities, similar to prior study. IMPRESSION: Unchanged appearance since prior study. Emphysematous changes in the lungs with linear atelectasis or fibrosis in the lung bases. Esophageal hiatal hernia behind the heart. No focal consolidation. Electronically Signed   By: Lucienne Capers M.D.   On: 10/19/2016 21:37    Procedures Procedures (including critical care time)  Medications Ordered in ED Medications  sodium chloride 0.9 % bolus 1,000 mL (not administered)  ondansetron (ZOFRAN) injection 4 mg (not administered)  albuterol (PROVENTIL) (2.5 MG/3ML) 0.083% nebulizer solution 5 mg (5 mg Nebulization Given 10/19/16 2047)  ipratropium (ATROVENT) nebulizer solution 0.5 mg (0.5 mg Nebulization Given 10/19/16 2047)  ondansetron (ZOFRAN) injection 4 mg (4 mg Intravenous Given 10/19/16 2112)     Initial Impression / Assessment and Plan / ED Course  I have reviewed the triage vital signs and the nursing notes.  Pertinent labs & imaging results that were available during my care of the patient were reviewed by me and considered in my medical decision making (see chart for details).  Clinical Course     The patient does have fairly severe COPD however she had minimal wheezing. She was also noted to have some rales however her x-ray explained this with what appears to be some fibrosis of the lungs. There is no signs of pneumonia, no significant leukocytosis, she did have hyponatremia which doesn't part explain her generalized weakness. The urinalysis is pending, I discussed her care with the hospitalist who will admit the patient to the hospital. She feels unsafe going home with her imbalance and generalized weakness. She still  continues to be nauseated despite Zofran that she has improved slightly.  Final  Clinical Impressions(s) / ED Diagnoses   Final diagnoses:  Hyponatremia    New Prescriptions New Prescriptions   No medications on file   I personally performed the services described in this documentation, which was scribed in my presence. The recorded information has been reviewed and is accurate.        Noemi Chapel, MD 10/19/16 [phone]

## 2016-10-19 NOTE — ED Triage Notes (Signed)
Pt with COPD that has experienced increased shortness of breath today. Pt also c/o generalized weakness and dry heaves.

## 2016-10-19 NOTE — Progress Notes (Signed)
Patient had nausea and vomiting at end of neb she finished  tx.

## 2016-10-20 DIAGNOSIS — E871 Hypo-osmolality and hyponatremia: Secondary | ICD-10-CM | POA: Diagnosis not present

## 2016-10-20 LAB — COMPREHENSIVE METABOLIC PANEL
ALT: 15 U/L (ref 14–54)
AST: 19 U/L (ref 15–41)
Albumin: 3.9 g/dL (ref 3.5–5.0)
Alkaline Phosphatase: 82 U/L (ref 38–126)
Anion gap: 6 (ref 5–15)
BUN: 7 mg/dL (ref 6–20)
CO2: 29 mmol/L (ref 22–32)
Calcium: 9.1 mg/dL (ref 8.9–10.3)
Chloride: 96 mmol/L — ABNORMAL LOW (ref 101–111)
Creatinine, Ser: 0.41 mg/dL — ABNORMAL LOW (ref 0.44–1.00)
GFR calc Af Amer: >60 mL/min (ref >60)
GFR calc non Af Amer: >60 mL/min (ref >60)
Glucose, Bld: 119 mg/dL — ABNORMAL HIGH (ref 65–99)
Potassium: 3.9 mmol/L (ref 3.5–5.1)
Sodium: 131 mmol/L — ABNORMAL LOW (ref 135–145)
Total Bilirubin: 0.4 mg/dL (ref 0.3–1.2)
Total Protein: 6.6 g/dL (ref 6.5–8.1)

## 2016-10-20 LAB — URINALYSIS, ROUTINE W REFLEX MICROSCOPIC
Bilirubin Urine: NEGATIVE
Glucose, UA: NEGATIVE mg/dL
Hgb urine dipstick: NEGATIVE
Ketones, ur: NEGATIVE mg/dL
Leukocytes, UA: NEGATIVE
Nitrite: NEGATIVE
Protein, ur: NEGATIVE mg/dL
Specific Gravity, Urine: 1.013 (ref 1.005–1.030)
pH: 7 (ref 5.0–8.0)

## 2016-10-20 LAB — CBC
HCT: 30.5 % — ABNORMAL LOW (ref 36.0–46.0)
Hemoglobin: 9.9 g/dL — ABNORMAL LOW (ref 12.0–15.0)
MCH: 24.5 pg — ABNORMAL LOW (ref 26.0–34.0)
MCHC: 32.5 g/dL (ref 30.0–36.0)
MCV: 75.5 fL — ABNORMAL LOW (ref 78.0–100.0)
Platelets: 427 10*3/uL — ABNORMAL HIGH (ref 150–400)
RBC: 4.04 MIL/uL (ref 3.87–5.11)
RDW: 16.5 % — ABNORMAL HIGH (ref 11.5–15.5)
WBC: 8.3 10*3/uL (ref 4.0–10.5)

## 2016-10-20 LAB — OSMOLALITY, URINE: Osmolality, Ur: 300 mOsm/kg (ref 300–900)

## 2016-10-20 MED ORDER — CITALOPRAM HYDROBROMIDE 20 MG PO TABS: 20.0000 mg | ORAL_TABLET | Freq: Every day | ORAL | Status: DC

## 2016-10-20 MED ORDER — ENOXAPARIN SODIUM 40 MG/0.4ML ~~LOC~~ SOLN: 40.0000 mg | SUBCUTANEOUS | Status: DC

## 2016-10-20 MED ORDER — SODIUM CHLORIDE 0.9 % IV BOLUS (SEPSIS)
500.0000 mL | Freq: Once | INTRAVENOUS | Status: AC
  Administered 2016-10-20: 500 mL via INTRAVENOUS

## 2016-10-20 MED ORDER — PANTOPRAZOLE SODIUM 40 MG PO TBEC
40.0000 mg | DELAYED_RELEASE_TABLET | Freq: Every day | ORAL | Status: DC
  Administered 2016-10-20: 40 mg via ORAL
  Filled 2016-10-20: qty 1

## 2016-10-20 MED ORDER — ACETAMINOPHEN 325 MG PO TABS
650.0000 mg | ORAL_TABLET | Freq: Three times a day (TID) | ORAL | Status: DC | PRN
  Administered 2016-10-20: 650 mg via ORAL
  Filled 2016-10-20: qty 2

## 2016-10-20 MED ORDER — LEVOTHYROXINE SODIUM 50 MCG PO TABS
50.0000 ug | ORAL_TABLET | Freq: Every day | ORAL | Status: DC
  Administered 2016-10-20: 50 ug via ORAL
  Filled 2016-10-20: qty 1

## 2016-10-20 MED ORDER — OXCARBAZEPINE 300 MG PO TABS
ORAL_TABLET | ORAL | Status: AC
  Filled 2016-10-20: qty 1

## 2016-10-20 MED ORDER — OXCARBAZEPINE 300 MG PO TABS
300.0000 mg | ORAL_TABLET | Freq: Two times a day (BID) | ORAL | Status: DC
  Administered 2016-10-20 (×2): 300 mg via ORAL
  Filled 2016-10-20 (×6): qty 1

## 2016-10-20 MED ORDER — LORAZEPAM 2 MG/ML IJ SOLN
0.5000 mg | Freq: Four times a day (QID) | INTRAMUSCULAR | Status: DC | PRN
  Administered 2016-10-20: 0.5 mg via INTRAVENOUS
  Filled 2016-10-20: qty 1

## 2016-10-20 MED ORDER — ALBUTEROL SULFATE (2.5 MG/3ML) 0.083% IN NEBU: 2.5000 mg | INHALATION_SOLUTION | Freq: Four times a day (QID) | RESPIRATORY_TRACT | Status: DC | PRN

## 2016-10-20 MED ORDER — ONDANSETRON HCL 4 MG PO TABS: 4.0000 mg | ORAL_TABLET | Freq: Four times a day (QID) | ORAL | 0 refills | Status: DC | PRN

## 2016-10-20 MED ORDER — ONDANSETRON HCL 4 MG/2ML IJ SOLN
4.0000 mg | Freq: Once | INTRAMUSCULAR | Status: AC
  Administered 2016-10-20: 4 mg via INTRAVENOUS

## 2016-10-20 MED ORDER — IPRATROPIUM-ALBUTEROL 0.5-2.5 (3) MG/3ML IN SOLN: 3.0000 mL | Freq: Four times a day (QID) | RESPIRATORY_TRACT | Status: DC | PRN

## 2016-10-20 MED ORDER — ONDANSETRON HCL 4 MG/2ML IJ SOLN
INTRAMUSCULAR | Status: AC
  Administered 2016-10-20: 2 mg
  Filled 2016-10-20: qty 2

## 2016-10-20 MED ORDER — ONDANSETRON HCL 4 MG PO TABS: 4.0000 mg | ORAL_TABLET | Freq: Four times a day (QID) | ORAL | Status: DC | PRN

## 2016-10-20 MED ORDER — ONDANSETRON HCL 4 MG/2ML IJ SOLN
4.0000 mg | Freq: Four times a day (QID) | INTRAMUSCULAR | Status: DC | PRN
  Administered 2016-10-20: 4 mg via INTRAVENOUS
  Filled 2016-10-20: qty 2

## 2016-10-20 MED ORDER — SODIUM CHLORIDE 0.9 % IV SOLN
INTRAVENOUS | Status: DC
  Administered 2016-10-20: 100 mL/h via INTRAVENOUS

## 2016-10-20 NOTE — H&P (Signed)
TRH H&P    Patient Demographics:    [name], is a 76 y.o. female  MRN: [id number]  DOB - Feb 15, 1941  Admit Date - 10/19/2016  Referring MD/NP/PA: Dr. Sabra Heck  Outpatient Primary MD for the patient is [facility] TOM, MD  Patient coming from: Home  Chief Complaint  Patient presents with  . Shortness of Breath      HPI:    [name]  is a 76 y.o. female, With history of COPD on home O2, AAA, diastolic CHF, hypertension who came to the hospital with intractable nausea and vomiting. Patient also complains of mild shortness of breath , she is on chronic home O2. 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 No chemotherapy or radiation treatment 5 years ago. She denies fever chills. She denies abdominal pain.  In the ED level for sodium 126.    Review of systems:    In addition to the HPI above,   No Headache, No changes with Vision or hearing, No problems swallowing food or Liquids, No Chest pain,  No Blood in stool or Urine, No dysuria, No new skin rashes or bruises, No new joints pains-aches,  No new weakness, tingling, numbness in any extremity, No recent weight gain or loss, No polyuria, polydypsia or polyphagia, No significant Mental Stressors.  A full 10 point Review of Systems was done, except as stated above, all other Review of Systems were negative.   With Past History of the following :    Past Medical History:  Diagnosis Date  . Adenocarcinoma of lung ([place]) 12/2010   left lung/surg only  . Allergic rhinitis   . 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 Anxiety disorder   . Aortic aneurysm ([place])   . Complication of anesthesia 2011   bloodpressure dropped during colonoscopy, not problems since  . Compression fracture   . COPD (chronic obstructive pulmonary disease) ([place])   . Depression   . Diastolic  dysfunction   . Diverticulosis   . Dysphagia   . Emphysema lung ([place]) 11/08/2014  . Hiatal hernia   . Hypothyroidism   . IBS (irritable bowel syndrome)   . On home O2 12/15/2013   chronic hypoxia  . PUD (peptic ulcer disease)    8yr  . Shortness of breath    with exertion  . Trigeminal neuralgia       Past Surgical History:  Procedure Laterality Date  . ABDOMINAL HYSTERECTOMY    . BACK SURGERY  January 13, 2014  . CATARACT EXTRACTION    . CATARACT EXTRACTION W/PHACO  09/02/2012   Procedure: CATARACT EXTRACTION PHACO AND INTRAOCULAR LENS PLACEMENT (IOC);  Surgeon: MElta GuadeloupeT. SGershon Crane MD;  Location: [facility]S;  Service: Ophthalmology;  Laterality: Left;  CDE:10.35  . CHOLECYSTECTOMY    . 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 COLONOSCOPY N/A 11/23/2014   Procedure: COLONOSCOPY;  Surgeon: SDanie Binder MD;  Location: [facility] END[facility] SUITE;  Service: Endoscopy;  Laterality: N/A;  930   . ENDARTERECTOMY Right 08/21/2013   Procedure: RIGHT CAROTID ANEURYSM RESECTION;  Surgeon: Rosetta Posner, MD;  Location: [facility] OR;  Service: Vascular;  Laterality: Right;  . ESOPHAGOGASTRODUODENOSCOPY  11/13/2009   w/dilation to 19m, gastric ulceration (H.Pylori) s/p treatment  . ESOPHAGOGASTRODUODENOSCOPY  11/21/2009   distal esophageal web, gastritis  . ESOPHAGOGASTRODUODENOSCOPY  03/14/12   SUXL:KGMWNUUVOin the distal esophagus/Mild gastritis/small HH. + H.pylori, prescribed Pylera. Finished treatment.   . fatty tumor removal from lt groin    . FRACTURE SURGERY Left    hand and left ring finger  . LUNG CANCER SURGERY  12/2010   Left VATS, minithoracotomy, LLL superior segmentectomy  . ORIF WRIST FRACTURE Left 04/09/2014   Procedure: OPEN REDUCTION INTERNAL FIXATION (ORIF) WRIST FRACTURE;  Surgeon: TRenette Butters MD;  Location: [facility]  Service: Orthopedics;  Laterality: Left;  . PERCUTANEOUS PINNING Left 04/09/2014   Procedure: PERCUTANEOUS PINNING EXTREMITY;  Surgeon: TRenette Butters MD;  [facility]  Service: Orthopedics;  Laterality: Left;  . TUBAL LIGATION    . vocal cord surgery  02/06/2011   laryngoscopy with bilateral vocal cord Radiesse injection for vocal cord paralysis  . YAG LASER APPLICATION Left [id number]/4403  Procedure: YAG LASER APPLICATION;  Surgeon: MRutherford Guys MD;  Location: [facility];  Service: Ophthalmology;  Laterality: Left;      Social History:      Social History  Substance Use Topics  . Smoking status: Former Smoker    Packs/day: 0.50    Years: 20.00    Types: Cigarettes    Quit date: 12/21/2010  . Smokeless tobacco: Never Used     Comment: smoking cessation info given and reviewed   . Alcohol use 0.0 oz/week     Comment: seldom       Family History :     Family History  Problem Relation Age of Onset  . Pulmonary embolism Mother   . Heart attack Father   . Hypertension Father   . Liver cancer Sister   . Cancer Sister   . Cancer Brother   . Cancer Daughter   . Colon cancer Neg Hx       Home Medications:   Prior to Admission medications   Medication Sig Start Date End Date Taking? Authorizing Provider  acetaminophen (TYLENOL) 650 MG CR tablet Take 1 tablet (650 mg total) by mouth every 8 (eight) hours as needed for pain. Patient taking differently: Take 500-650 mg by mouth every 8 (eight) hours as needed for pain.  09/13/16  Yes FFlorencia Reasons MD  albuterol (PROVENTIL HFA;VENTOLIN HFA) 108 (90 Base) MCG/ACT inhaler Inhale 2 puffs into the lungs every 6 (six) hours as needed for wheezing or shortness of breath. 09/24/16  Yes WSusy Frizzle MD  albuterol (PROVENTIL) (2.5 MG/3ML) 0.083% nebulizer solution Take 3 mLs (2.5 mg total) by nebulization every 6 (six) hours as needed for wheezing or shortness of breath. 03/01/16  Yes BNat Christen MD  ALPRAZolam (Duanne Moron 0.5 MG tablet Take 1 tablet (0.5 mg total) by mouth 2 (two) times daily as needed for anxiety or sleep. Patient taking differently: Take 0.5 mg by mouth at bedtime as  needed for anxiety or sleep.  10/04/16  Yes WSusy Frizzle MD  calcium citrate-vitamin D (CITRACAL+D) 315-200 MG-UNIT per tablet Take 1 tablet by mouth daily.    Yes Historical Provider, MD  citalopram (CELEXA) 40 MG tablet Take 0.5 tablets (20 mg total) by mouth at  bedtime. 09/14/16  Yes Nishant Dhungel, MD  levothyroxine (SYNTHROID, LEVOTHROID) 50 MCG tablet Take 1 tablet (50 mcg total) by mouth daily before breakfast. 09/21/16  Yes [name], MD  Multiple Vitamin (MULTIVITAMIN WITH MINERALS) TABS tablet Take 1 tablet by mouth daily.   Yes Historical Provider, MD  omeprazole (PRILOSEC) 40 MG capsule Take 1 capsule (40 mg total) by mouth daily. 06/14/16  Yes [name], MD  Oxcarbazepine (TRILEPTAL) 300 MG tablet Take 300 mg by mouth 2 (two) times daily. 09/22/16  Yes Historical Provider, MD  traMADol (ULTRAM) 50 MG tablet TAKE (1) TABLET BY MOUTH EVERY 6 HOURS AS NEEDED. 10/04/16  Yes [name], MD  umeclidinium-vilanterol [facility] ELLIPTA) 62.5-25 MCG/INH AEPB Inhale 1 puff into the lungs daily. 05/08/16  Yes [name], MD  cefUROXime (CEFTIN) 500 MG tablet Take 1 tablet (500 mg total) by mouth 2 (two) times daily with a meal. Patient not taking: Reported on 10/19/2016 09/21/16   [name], MD     Allergies:     Allergies  Allergen Reactions  . Ciprofloxacin Hcl Hives  . Iohexol      Desc: Per [facility], pt is allergic to IV contrast, no type of reaction was available. Pt cannot remember but first reacted around 15 yrs ago from an IVP. Notes were date from 2009 at urology center., Onset Date: [id number]   . Prozac [Fluoxetine Hcl] Rash  . Sulfonamide Derivatives Hives and Rash     Physical Exam:   Vitals  Blood pressure 126/68, pulse 99, temperature 98.5 F (36.9 C), temperature source Oral, resp. rate 24, height '5\' 5"'$  (1.651 m), weight 59.9 kg (132 lb), SpO2 (!) 88 %.  1.  General: Caucasian female in no acute distress  2. Psychiatric:   Intact judgement and  insight, awake alert, oriented x 3.  3. Neurologic: No focal neurological deficits, all cranial nerves intact.Strength 5/5 all 4 extremities, sensation intact all 4 extremities, plantars down going.  4. Eyes :  anicteric sclerae, moist conjunctivae with no lid lag. PERRLA.  5. ENMT:  Oropharynx clear with moist mucous membranes and good dentition  6. Neck:  supple, no cervical lymphadenopathy appriciated, No thyromegaly  7. Respiratory : Normal respiratory effort, good air movement bilaterally,clear to  auscultation bilaterally  8. Cardiovascular : RRR, no gallops, rubs or murmurs, no leg edema  9. Gastrointestinal:  Positive bowel sounds, abdomen soft, non-tender to palpation,no hepatosplenomegaly, no rigidity or guarding       10. Skin:  No cyanosis, normal texture and turgor, no rash, lesions or ulcers  11.Musculoskeletal:  Good muscle tone,  joints appear normal , no effusions,  normal range of motion    Data Review:    CBC  Recent Labs Lab 10/19/16 2057  WBC 6.5  HGB 10.2*  HCT 31.1*  PLT 399  MCV 75.5*  MCH 24.8*  MCHC 32.8  RDW 16.6*  LYMPHSABS 0.9  MONOABS 0.5  EOSABS 0.1  BASOSABS 0.0   ------------------------------------------------------------------------------------------------------------------  Chemistries   Recent Labs Lab 10/19/16 2057  NA 126*  K 3.9  CL 91*  CO2 27  GLUCOSE 125*  BUN 10  CREATININE 0.43*  CALCIUM 9.4  AST 21  ALT 17  ALKPHOS 82  BILITOT 0.2*   ------------------------------------------------------------------------------------------------------------------  ------------------------------------------------------------------------------------------------------------------ GFR: Estimated Creatinine Clearance: 54.7 mL/min (by C-G formula based on SCr of 0.43 mg/dL (L)). Liver Function Tests:  Recent Labs Lab 10/19/16 2057  AST 21  ALT 17  ALKPHOS 82  BILITOT 0.2*  PROT 7.0    ALBUMIN 4.1   No results for input(s): LIPASE, AMYLASE in the last 168 hours. No results for input(s): AMMONIA in the last 168 hours. Coagulation Profile: No results for input(s): INR, PROTIME in the last 168 hours. Cardiac Enzymes:  Recent Labs Lab 10/19/16 2057  TROPONINI <0.03    --------------------------------------------------------------------------------------------------------------- Urine analysis:    Component Value Date/Time   COLORURINE YELLOW 10/20/2016 0143   APPEARANCEUR HAZY (A) 10/20/2016 0143   LABSPEC 1.013 10/20/2016 0143   PHURINE 7.0 10/20/2016 0143   GLUCOSEU NEGATIVE 10/20/2016 0143   HGBUR NEGATIVE 10/20/2016 0143   BILIRUBINUR NEGATIVE 10/20/2016 0143   KETONESUR NEGATIVE 10/20/2016 0143   PROTEINUR NEGATIVE 10/20/2016 0143   UROBILINOGEN 0.2 06/01/2015 1145   NITRITE NEGATIVE 10/20/2016 0143   LEUKOCYTESUR NEGATIVE 10/20/2016 0143      Imaging Results:    Dg Chest 2 View  Result Date: 10/19/2016 CLINICAL DATA:  Increased shortness of breath with weakness today. History of lung cancer. EXAM: CHEST  2 VIEW COMPARISON:  09/21/2016 FINDINGS: Shallow inspiration with underlying emphysematous changes. Linear fibrosis or atelectasis in the lung bases similar previous study. Normal heart size and pulmonary vascularity. No focal airspace disease or consolidation the lungs. Surgical clips in the left mid lung. No blunting of costophrenic angles. No pneumothorax. Calcified and tortuous aorta. Esophageal hiatal hernia behind the heart. Postoperative changes in the thoracolumbar spine. Multiple thoracic vertebral compression deformities, similar to prior study. IMPRESSION: Unchanged appearance since prior study. Emphysematous changes in the lungs with linear atelectasis or fibrosis in the lung bases. Esophageal hiatal hernia behind the heart. No focal consolidation. Electronically Signed   By: Lucienne Capers M.D.   On: 10/19/2016 21:37    My personal review  of EKG: Rhythm NSR   Assessment & Plan:    Active Problems:   Hyponatremia   1. Intractable nausea vomiting- likely viral gastroenteritis, continue Zofran when necessary. Start IV normal saline at 100 mL per hour 2. Hyponatremia- patient has history of hyponatremia, sodium 126 secondary to nausea and vomiting. Will check serum and urine osmolality. Follow BMP in a.m. 3. COPD- no wheezing on auscultation, stable. Start DuoNeb nebulizers every 6 hours when necessary. 4. Anxiety-patient takes Xanax at home, will start Ativan 0.5 mg IV every 6 hours when necessary   DVT Prophylaxis-   Lovenox   AM Labs Ordered, also please review Full Orders  Family Communication: No family present at bedside  Code Status: Full code  Admission status: Observation  Time spent in minutes : 60 minutes   Valena Ivanov S M.D on 10/20/2016 at 2:45 AM  Between 7am to 7pm - Pager - [phone]. After 7pm go to [email or web address] - password [facility]  [facility] - Office  [phone]

## 2016-10-20 NOTE — Progress Notes (Signed)
Pt in room resting, door open, call light within reach. Nothing needed at this time.

## 2016-10-20 NOTE — Discharge Summary (Signed)
RAYONNA HELDMAN HYQ:[id number] DOB: Apr 01, 1941 DOA: 10/19/2016  PCP: [name], MD  Admit date: 10/19/2016  Discharge date: 10/20/2016  Admitted From: Home Disposition:  Home   Recommendations for Outpatient Follow-up:   Follow up with PCP in 1-2 weeks  PCP Please obtain BMP/CBC, 2 view CXR in 1week,  (see Discharge instructions)   PCP Please follow up on the following pending results: None   Home Health: None   Equipment/Devices: None  Consultations: None Discharge Condition: Stable   CODE STATUS: Full   Diet Recommendation:  Heart Healthy    Chief Complaint  Patient presents with  . Shortness of Breath     Brief history of present illness from the day of admission and additional interim summary     [name]  is a 76 y.o. female, With history of COPD on home O2, AAA, diastolic CHF, hypertension who came to the hospital with intractable nausea and vomiting. Patient also complains of mild shortness of breath , she is on chronic home O2. 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 No chemotherapy or radiation treatment 5 years ago. She denies fever chills. She denies abdominal pain.  Hospital issues addressed    1.  Intractable nausea vomiting- Due to viral gastroenteritis, resolved with supportive care which included bowel rest, Zofran and IV fluids. Symptom-free tolerating diet will be discharged home with close PCP follow-up. 2. Hyponatremia- acute on chronic due to dehydration from nausea vomiting, much improved after IV fluids PCP to recheck in a week. 3. COPD- no wheezing on auscultation, stable. No acute issues at this time she is not short of breath, continue home regimen as before unchanged.           4.    Anxiety. Stable continue home regimen  unchanged.   Discharge diagnosis     Active Problems:   Hyponatremia    Discharge instructions    Discharge Instructions    Diet - low sodium heart healthy    Complete by:  As directed    Discharge instructions    Complete by:  As directed    Follow with Primary MD Jenna Luo TOM, MD in 7 days   Get CBC, CMP   checked  by Primary MD or SNF MD in 5-7 days ( we routinely change or add medications that can affect your baseline labs and fluid status, therefore we recommend that you get the mentioned basic workup next visit with your PCP, your PCP may decide not to get them or add new tests based on their clinical decision)   Activity: As tolerated with Full fall precautions use walker/cane & assistance as needed   Disposition Home    Diet:     Heart Healthy   For Heart failure patients - Check your Weight same time everyday, if you gain over 2 pounds, or you develop in leg swelling, experience more shortness of breath or chest pain, call your Primary MD immediately. Follow Cardiac Low Salt Diet and 1.5 lit/day fluid  restriction.   On your next visit with your primary care physician please Get Medicines reviewed and adjusted.   Please request your Prim.MD to go over all Hospital Tests and Procedure/Radiological results at the follow up, please get all Hospital records sent to your Prim MD by signing hospital release before you go home.   If you experience worsening of your admission symptoms, develop shortness of breath, life threatening emergency, suicidal or homicidal thoughts you must seek medical attention immediately by calling 911 or calling your MD immediately  if symptoms less severe.  You Must read complete instructions/literature along with all the possible adverse reactions/side effects for all the Medicines you take and that have been prescribed to you. Take any new Medicines after you have completely understood and accpet all the possible adverse reactions/side  effects.   Do not drive, operate heavy machinery, perform activities at heights, swimming or participation in water activities or provide baby sitting services if your were admitted for syncope or siezures until you have seen by Primary MD or a Neurologist and advised to do so again.  Do not drive when taking Pain medications.    Do not take more than prescribed Pain, Sleep and Anxiety Medications  Special Instructions: If you have smoked or chewed Tobacco  in the last 2 yrs please stop smoking, stop any regular Alcohol  and or any Recreational drug use.  Wear Seat belts while driving.   Please note  You were cared for by a hospitalist during your hospital stay. If you have any questions about your discharge medications or the care you received while you were in the hospital after you are discharged, you can call the unit and asked to speak with the hospitalist on call if the hospitalist that took care of you is not available. Once you are discharged, your primary care physician will handle any further medical issues. Please note that NO REFILLS for any discharge medications will be authorized once you are discharged, as it is imperative that you return to your primary care physician (or establish a relationship with a primary care physician if you do not have one) for your aftercare needs so that they can reassess your need for medications and monitor your lab values.   Increase activity slowly    Complete by:  As directed       Discharge Medications   Allergies as of 10/20/2016      Reactions   Ciprofloxacin Hcl Hives   Iohexol     Desc: Per [facility], pt is allergic to IV contrast, no type of reaction was available. Pt cannot remember but first reacted around 15 yrs ago from an IVP. Notes were date from 2009 at urology center., Onset Date: [id number]   Prozac [fluoxetine Hcl] Rash   Sulfonamide Derivatives Hives, Rash      Medication List    TAKE these medications     acetaminophen 650 MG CR tablet Commonly known as:  TYLENOL Take 1 tablet (650 mg total) by mouth every 8 (eight) hours as needed for pain. What changed:  how much to take   albuterol (2.5 MG/3ML) 0.083% nebulizer solution Commonly known as:  PROVENTIL Take 3 mLs (2.5 mg total) by nebulization every 6 (six) hours as needed for wheezing or shortness of breath.   albuterol 108 (90 Base) MCG/ACT inhaler Commonly known as:  PROVENTIL HFA;VENTOLIN HFA Inhale 2 puffs into the lungs every 6 (six) hours as needed for wheezing or shortness of breath.  ALPRAZolam 0.5 MG tablet Commonly known as:  XANAX Take 1 tablet (0.5 mg total) by mouth 2 (two) times daily as needed for anxiety or sleep. What changed:  when to take this   calcium citrate-vitamin D 315-200 MG-UNIT tablet Commonly known as:  CITRACAL+D Take 1 tablet by mouth daily.   cefUROXime 500 MG tablet Commonly known as:  CEFTIN Take 1 tablet (500 mg total) by mouth 2 (two) times daily with a meal.   citalopram 40 MG tablet Commonly known as:  CELEXA Take 0.5 tablets (20 mg total) by mouth at bedtime.   levothyroxine 50 MCG tablet Commonly known as:  SYNTHROID, LEVOTHROID Take 1 tablet (50 mcg total) by mouth daily before breakfast.   multivitamin with minerals Tabs tablet Take 1 tablet by mouth daily.   omeprazole 40 MG capsule Commonly known as:  PRILOSEC Take 1 capsule (40 mg total) by mouth daily.   ondansetron 4 MG tablet Commonly known as:  ZOFRAN Take 1 tablet (4 mg total) by mouth every 6 (six) hours as needed for nausea.   Oxcarbazepine 300 MG tablet Commonly known as:  TRILEPTAL Take 300 mg by mouth 2 (two) times daily.   traMADol 50 MG tablet Commonly known as:  ULTRAM TAKE (1) TABLET BY MOUTH EVERY 6 HOURS AS NEEDED.   umeclidinium-vilanterol 62.5-25 MCG/INH Aepb Commonly known as:  ANORO ELLIPTA Inhale 1 puff into the lungs daily.       Follow-up Information    PICKARD,WARREN TOM, MD. Schedule  an appointment as soon as possible for a visit in 1 week(s).   Specialty:  Family Medicine Contact information: [address] [place] [address] [phone]           Major procedures and Radiology Reports - PLEASE review detailed and final reports thoroughly  -       Dg Chest 2 View  Result Date: 10/19/2016 CLINICAL DATA:  Increased shortness of breath with weakness today. History of lung cancer. EXAM: CHEST  2 VIEW COMPARISON:  09/21/2016 FINDINGS: Shallow inspiration with underlying emphysematous changes. Linear fibrosis or atelectasis in the lung bases similar previous study. Normal heart size and pulmonary vascularity. No focal airspace disease or consolidation the lungs. Surgical clips in the left mid lung. No blunting of costophrenic angles. No pneumothorax. Calcified and tortuous aorta. Esophageal hiatal hernia behind the heart. Postoperative changes in the thoracolumbar spine. Multiple thoracic vertebral compression deformities, similar to prior study. IMPRESSION: Unchanged appearance since prior study. Emphysematous changes in the lungs with linear atelectasis or fibrosis in the lung bases. Esophageal hiatal hernia behind the heart. No focal consolidation. Electronically Signed   By: Lucienne Capers M.D.   On: 10/19/2016 21:37     Micro Results     No results found for this or any previous visit (from the past 240 hour(s)).  Today   Subjective    Marquetta Nathaniel today has no headache,no chest abdominal pain,no new weakness tingling or numbness, feels much better wants to go home today.     Objective   Blood pressure (!) 117/53, pulse 98, temperature 98.5 F (36.9 C), temperature source Oral, resp. rate (!) 24, height '5\' 5"'$  (1.651 m), weight 60.8 kg (134 lb 0.6 oz), SpO2 97 %.   Intake/Output Summary (Last 24 hours) at 10/20/16 0825 Last data filed at 10/20/16 0700  Gross per 24 hour  Intake          1358.33 ml  Output  0 ml  Net           1358.33 ml    Exam Awake Alert, Oriented x 3, No new F.N deficits, Normal affect [place].AT,PERRAL Supple Neck,No JVD, No cervical lymphadenopathy appriciated.  Symmetrical Chest wall movement, Good air movement bilaterally, CTAB RRR,No Gallops,Rubs or new Murmurs, No Parasternal Heave +ve B.Sounds, Abd Soft, Non tender, No organomegaly appriciated, No rebound -guarding or rigidity. No Cyanosis, Clubbing or edema, No new Rash or bruise   Data Review   CBC w Diff: Lab Results  Component Value Date   WBC 8.3 10/20/2016   HGB 9.9 (L) 10/20/2016   HCT 30.5 (L) 10/20/2016   PLT 427 (H) 10/20/2016   LYMPHOPCT 13 10/19/2016   MONOPCT 7 10/19/2016   EOSPCT 1 10/19/2016   BASOPCT 1 10/19/2016    CMP: Lab Results  Component Value Date   NA 131 (L) 10/20/2016   K 3.9 10/20/2016   CL 96 (L) 10/20/2016   CO2 29 10/20/2016   BUN 7 10/20/2016   CREATININE 0.41 (L) 10/20/2016   CREATININE 0.54 (L) 03/30/2016   PROT 6.6 10/20/2016   ALBUMIN 3.9 10/20/2016   BILITOT 0.4 10/20/2016   ALKPHOS 82 10/20/2016   AST 19 10/20/2016   ALT 15 10/20/2016  .   Total Time in preparing paper work, data evaluation and todays exam - 35 minutes  Thurnell Lose M.D on 10/20/2016 at 8:25 AM  [facility]   Office  [phone]

## 2016-10-20 NOTE — Discharge Instructions (Signed)
Follow with Primary MD Jenna Luo TOM, MD in 7 days   Get CBC, CMP   checked  by Primary MD or SNF MD in 5-7 days ( we routinely change or add medications that can affect your baseline labs and fluid status, therefore we recommend that you get the mentioned basic workup next visit with your PCP, your PCP may decide not to get them or add new tests based on their clinical decision)   Activity: As tolerated with Full fall precautions use walker/cane & assistance as needed   Disposition Home    Diet:     Heart Healthy   For Heart failure patients - Check your Weight same time everyday, if you gain over 2 pounds, or you develop in leg swelling, experience more shortness of breath or chest pain, call your Primary MD immediately. Follow Cardiac Low Salt Diet and 1.5 lit/day fluid restriction.   On your next visit with your primary care physician please Get Medicines reviewed and adjusted.   Please request your Prim.MD to go over all Hospital Tests and Procedure/Radiological results at the follow up, please get all Hospital records sent to your Prim MD by signing hospital release before you go home.   If you experience worsening of your admission symptoms, develop shortness of breath, life threatening emergency, suicidal or homicidal thoughts you must seek medical attention immediately by calling 911 or calling your MD immediately  if symptoms less severe.  You Must read complete instructions/literature along with all the possible adverse reactions/side effects for all the Medicines you take and that have been prescribed to you. Take any new Medicines after you have completely understood and accpet all the possible adverse reactions/side effects.   Do not drive, operate heavy machinery, perform activities at heights, swimming or participation in water activities or provide baby sitting services if your were admitted for syncope or siezures until you have seen by Primary MD or a Neurologist and  advised to do so again.  Do not drive when taking Pain medications.    Do not take more than prescribed Pain, Sleep and Anxiety Medications  Special Instructions: If you have smoked or chewed Tobacco  in the last 2 yrs please stop smoking, stop any regular Alcohol  and or any Recreational drug use.  Wear Seat belts while driving.   Please note  You were cared for by a hospitalist during your hospital stay. If you have any questions about your discharge medications or the care you received while you were in the hospital after you are discharged, you can call the unit and asked to speak with the hospitalist on call if the hospitalist that took care of you is not available. Once you are discharged, your primary care physician will handle any further medical issues. Please note that NO REFILLS for any discharge medications will be authorized once you are discharged, as it is imperative that you return to your primary care physician (or establish a relationship with a primary care physician if you do not have one) for your aftercare needs so that they can reassess your need for medications and monitor your lab values.

## 2016-10-20 NOTE — Progress Notes (Addendum)
Pt resting in bed with door open and bed in lowest position. Nothing needed at this time.

## 2016-10-20 NOTE — Progress Notes (Signed)
Pt being discharged home. Instructions including medications and F/U appts given to pt; understanding verbalized. PIV removed; no complications. Pt is currently awaiting son to pick her up.

## 2016-10-22 ENCOUNTER — Encounter: Payer: Self-pay | Admitting: Family Medicine

## 2016-10-22 DIAGNOSIS — R109 Unspecified abdominal pain: Secondary | ICD-10-CM

## 2016-10-22 LAB — OSMOLALITY: Osmolality: 274 mOsm/kg — ABNORMAL LOW (ref 275–295)

## 2016-11-01 ENCOUNTER — Other Ambulatory Visit: Payer: Self-pay | Admitting: Family Medicine

## 2016-11-01 ENCOUNTER — Ambulatory Visit (INDEPENDENT_AMBULATORY_CARE_PROVIDER_SITE_OTHER): Payer: PPO | Admitting: Family Medicine

## 2016-11-01 ENCOUNTER — Encounter: Payer: Self-pay | Admitting: Family Medicine

## 2016-11-01 VITALS — BP 112/68 | HR 84 | Temp 98.1°F | Resp 20 | Ht 66.0 in | Wt 132.0 lb

## 2016-11-01 DIAGNOSIS — J449 Chronic obstructive pulmonary disease, unspecified: Secondary | ICD-10-CM

## 2016-11-01 DIAGNOSIS — Z09 Encounter for follow-up examination after completed treatment for conditions other than malignant neoplasm: Secondary | ICD-10-CM

## 2016-11-01 DIAGNOSIS — R5381 Other malaise: Secondary | ICD-10-CM

## 2016-11-01 DIAGNOSIS — J439 Emphysema, unspecified: Secondary | ICD-10-CM

## 2016-11-01 DIAGNOSIS — E222 Syndrome of inappropriate secretion of antidiuretic hormone: Secondary | ICD-10-CM | POA: Insufficient documentation

## 2016-11-01 DIAGNOSIS — E039 Hypothyroidism, unspecified: Secondary | ICD-10-CM

## 2016-11-01 DIAGNOSIS — J9621 Acute and chronic respiratory failure with hypoxia: Secondary | ICD-10-CM

## 2016-11-01 DIAGNOSIS — D649 Anemia, unspecified: Secondary | ICD-10-CM | POA: Diagnosis not present

## 2016-11-01 DIAGNOSIS — I5189 Other ill-defined heart diseases: Secondary | ICD-10-CM

## 2016-11-01 DIAGNOSIS — E871 Hypo-osmolality and hyponatremia: Secondary | ICD-10-CM

## 2016-11-01 DIAGNOSIS — I6529 Occlusion and stenosis of unspecified carotid artery: Secondary | ICD-10-CM

## 2016-11-01 LAB — CBC WITH DIFFERENTIAL/PLATELET
Basophils Absolute: 0 cells/uL (ref 0–200)
Basophils Relative: 0 %
Eosinophils Absolute: 222 cells/uL (ref 15–500)
Eosinophils Relative: 3 %
HCT: 32.9 % — ABNORMAL LOW (ref 35.0–45.0)
Hemoglobin: 10.2 g/dL — ABNORMAL LOW (ref 12.0–15.0)
Lymphocytes Relative: 19 %
Lymphs Abs: 1406 cells/uL (ref 850–3900)
MCH: 24.1 pg — ABNORMAL LOW (ref 27.0–33.0)
MCHC: 31 g/dL — ABNORMAL LOW (ref 32.0–36.0)
MCV: 77.8 fL — ABNORMAL LOW (ref 80.0–100.0)
MPV: 8.2 fL (ref 7.5–12.5)
Monocytes Absolute: 518 cells/uL (ref 200–950)
Monocytes Relative: 7 %
Neutro Abs: 5254 cells/uL (ref 1500–7800)
Neutrophils Relative %: 71 %
Platelets: 497 10*3/uL — ABNORMAL HIGH (ref 140–400)
RBC: 4.23 MIL/uL (ref 3.80–5.10)
RDW: 16.5 % — ABNORMAL HIGH (ref 11.0–15.0)
WBC: 7.4 10*3/uL (ref 3.8–10.8)

## 2016-11-01 LAB — COMPLETE METABOLIC PANEL WITH GFR
ALT: 11 U/L (ref 6–29)
AST: 15 U/L (ref 10–35)
Albumin: 4.3 g/dL (ref 3.6–5.1)
Alkaline Phosphatase: 78 U/L (ref 33–130)
BUN: 9 mg/dL (ref 7–25)
CO2: 25 mmol/L (ref 20–31)
Calcium: 9.7 mg/dL (ref 8.6–10.4)
Chloride: 96 mmol/L — ABNORMAL LOW (ref 98–110)
Creat: 0.4 mg/dL — ABNORMAL LOW (ref 0.60–0.93)
GFR, Est African American: >89 mL/min (ref >=60)
GFR, Est Non African American: >89 mL/min (ref >=60)
Glucose, Bld: 94 mg/dL (ref 70–99)
Potassium: 4.6 mmol/L (ref 3.5–5.3)
Sodium: 132 mmol/L — ABNORMAL LOW (ref 135–146)
Total Bilirubin: 0.3 mg/dL (ref 0.2–1.2)
Total Protein: 6.7 g/dL (ref 6.1–8.1)

## 2016-11-01 LAB — TSH: TSH: 1.25 mIU/L

## 2016-11-01 NOTE — Progress Notes (Signed)
Subjective:    Patient ID: [name], female    DOB: Aug 14, 1941, 76 y.o.   MRN: [id number]  HPI Patient is here today for hospital follow-up. She was recently admitted to the hospital. I have copied relevant portions of the discharge summary and included them below for my reference:  Admit date: 10/19/2016  Discharge date: 10/20/2016  Admitted From: Home Disposition:  Home   Recommendations for Outpatient Follow-up:   Follow up with PCP in 1-2 weeks  PCP Please obtain BMP/CBC, 2 view CXR in 1week,  (see Discharge instructions)   PCP Please follow up on the following pending results: None   Home Health: None   Equipment/Devices: None  Consultations: None Discharge Condition: Stable   CODE STATUS: Full   Diet Recommendation:  Heart Healthy       Chief Complaint  Patient presents with  . Shortness of Breath     Brief history of present illness from the day of admission and additional interim summary    [name] a 76 y.o.female,With history of COPD on home O2, AAA, diastolic CHF, hypertension who came to the hospital with intractable nausea and vomiting. Patient also complains of mild shortness of breath , she is on chronic home O2. 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 No chemotherapy or radiation treatment 5 years ago. She denies fever chills. She denies abdominal pain.  Hospital issues addressed    1.  Intractable nausea vomiting- Due to viral gastroenteritis, resolved with supportive care which included bowel rest, Zofran and IV fluids. Symptom-free tolerating diet will be discharged home with close PCP follow-up. 2. Hyponatremia- acute on chronic due to dehydration from nausea vomiting, much improved after IV fluids PCP to recheck in a week. 3. COPD-no wheezing on auscultation, stable. No acute issues at this time she is not short of breath, continue home regimen as before unchanged.           4.     Anxiety. Stable continue home regimen unchanged.  11/01/16 Patient is here today for follow-up.  She feels much better. However she is extremely weak. Whenever she stands up to walk her heart begins to race. She denies any chest pain. She denies any angina. She denies any dizziness. She denies any syncope. However she does need to rest. She appears very frail and deconditioned. She is walking with a walker and requiring oxygen. I believe she would be a good candidate for pulmonary rehabilitation/physical therapy to improve her deconditioning. She also appears to be protein calorie malnourished. She states that she start drinking a protein supplement on a daily basis.  Past Medical History:  Diagnosis Date  . Adenocarcinoma of lung ([place]) 12/2010   left lung/surg only  . Allergic rhinitis   . 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 Anxiety disorder   . Aortic aneurysm ([place])   . Complication of anesthesia 2011   bloodpressure dropped during colonoscopy, not problems since  . Compression fracture   . COPD (chronic obstructive pulmonary disease) ([place])   . Depression   . Diastolic dysfunction   . Diverticulosis   . Dysphagia   . Emphysema lung ([place]) 11/08/2014  . Hiatal hernia   . Hypothyroidism   . IBS (irritable bowel syndrome)   . On home O2 12/15/2013   chronic hypoxia  . PUD (peptic ulcer disease)    7yr  . Shortness of breath    with exertion  .  Trigeminal neuralgia    Past Surgical History:  Procedure Laterality Date  . ABDOMINAL HYSTERECTOMY    . BACK SURGERY  January 13, 2014  . CATARACT EXTRACTION    . CATARACT EXTRACTION W/PHACO  09/02/2012   Procedure: CATARACT EXTRACTION PHACO AND INTRAOCULAR LENS PLACEMENT (IOC);  Surgeon: Elta Guadeloupe T. Gershon Crane, MD;  Location: [facility];  Service: Ophthalmology;  Laterality: Left;  CDE:10.35  . CHOLECYSTECTOMY    . 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 COLONOSCOPY N/A 11/23/2014     Procedure: COLONOSCOPY;  Surgeon: Danie Binder, MD;  Location: [facility];  Service: Endoscopy;  Laterality: N/A;  930   . ENDARTERECTOMY Right 08/21/2013   Procedure: RIGHT CAROTID ANEURYSM RESECTION;  Surgeon: Rosetta Posner, MD;  [facility];  Service: Vascular;  Laterality: Right;  . ESOPHAGOGASTRODUODENOSCOPY  11/13/2009   w/dilation to 82m, gastric ulceration (H.Pylori) s/p treatment  . ESOPHAGOGASTRODUODENOSCOPY  11/21/2009   distal esophageal web, gastritis  . ESOPHAGOGASTRODUODENOSCOPY  03/14/12   SALP:[id number] the distal esophagus/Mild gastritis/small HH. + H.pylori, prescribed Pylera. Finished treatment.   . fatty tumor removal from lt groin    . FRACTURE SURGERY Left    hand and left ring finger  . LUNG CANCER SURGERY  12/2010   Left VATS, minithoracotomy, LLL superior segmentectomy  . ORIF WRIST FRACTURE Left 04/09/2014   Procedure: OPEN REDUCTION INTERNAL FIXATION (ORIF) WRIST FRACTURE;  Surgeon: TRenette Butters MD;  Location: [facility]  Service: Orthopedics;  Laterality: Left;  . PERCUTANEOUS PINNING Left 04/09/2014   Procedure: PERCUTANEOUS PINNING EXTREMITY;  Surgeon: TRenette Butters MD;  [facility]  Service: Orthopedics;  Laterality: Left;  . TUBAL LIGATION    . vocal cord surgery  02/06/2011   laryngoscopy with bilateral vocal cord Radiesse injection for vocal cord paralysis  . YAG LASER APPLICATION Left 135/32/9924  Procedure: YAG LASER APPLICATION;  Surgeon: MRutherford Guys MD;  Location: [facility];  Service: Ophthalmology;  Laterality: Left;   Current Outpatient Prescriptions on File Prior to Visit  Medication Sig Dispense Refill  . acetaminophen (TYLENOL) 650 MG CR tablet Take 1 tablet (650 mg total) by mouth every 8 (eight) hours as needed for pain. (Patient taking differently: Take 500-650 mg by mouth every 8 (eight) hours as needed for pain. ) 30 tablet 0  . albuterol (PROVENTIL HFA;VENTOLIN HFA) 108 (90 Base) MCG/ACT inhaler Inhale 2 puffs into the lungs  every 6 (six) hours as needed for wheezing or shortness of breath. 1 Inhaler 6  . albuterol (PROVENTIL) (2.5 MG/3ML) 0.083% nebulizer solution Take 3 mLs (2.5 mg total) by nebulization every 6 (six) hours as needed for wheezing or shortness of breath. 75 mL 12  . ALPRAZolam (XANAX) 0.5 MG tablet Take 1 tablet (0.5 mg total) by mouth 2 (two) times daily as needed for anxiety or sleep. (Patient taking differently: Take 0.5 mg by mouth at bedtime as needed for anxiety or sleep. ) 60 tablet 1  . calcium citrate-vitamin D (CITRACAL+D) 315-200 MG-UNIT per tablet Take 1 tablet by mouth daily.     . cefUROXime (CEFTIN) 500 MG tablet Take 1 tablet (500 mg total) by mouth 2 (two) times daily with a meal. (Patient not taking: Reported on 10/19/2016) 20 tablet 0  . citalopram (CELEXA) 40 MG tablet Take 0.5 tablets (20 mg total) by mouth at bedtime. 90 tablet 4  . levothyroxine (SYNTHROID, LEVOTHROID) 50 MCG tablet Take 1 tablet (50 mcg total) by  mouth daily before breakfast. 90 tablet 3  . Multiple Vitamin (MULTIVITAMIN WITH MINERALS) TABS tablet Take 1 tablet by mouth daily.    Marland Kitchen omeprazole (PRILOSEC) 40 MG capsule Take 1 capsule (40 mg total) by mouth daily. 90 capsule 3  . ondansetron (ZOFRAN) 4 MG tablet Take 1 tablet (4 mg total) by mouth every 6 (six) hours as needed for nausea. 20 tablet 0  . Oxcarbazepine (TRILEPTAL) 300 MG tablet Take 300 mg by mouth 2 (two) times daily.    . traMADol (ULTRAM) 50 MG tablet TAKE (1) TABLET BY MOUTH EVERY 6 HOURS AS NEEDED. 30 tablet 0  . umeclidinium-vilanterol (ANORO ELLIPTA) 62.5-25 MCG/INH AEPB Inhale 1 puff into the lungs daily. 90 each 3   No current facility-administered medications on file prior to visit.    Allergies  Allergen Reactions  . Ciprofloxacin Hcl Hives  . Iohexol      Desc: Per [facility], pt is allergic to IV contrast, no type of reaction was available. Pt cannot remember but first reacted around 15 yrs ago from an IVP. Notes were date from  2009 at urology center., Onset Date: [id number]   . Prozac [Fluoxetine Hcl] Rash  . Sulfonamide Derivatives Hives and Rash   Social History   Social History  . Marital status: Divorced    Spouse name: N/A  . Number of children: N/A  . Years of education: N/A   Occupational History  . Not on file.   Social History Main Topics  . Smoking status: Former Smoker    Packs/day: 0.50    Years: 20.00    Types: Cigarettes    Quit date: 12/21/2010  . Smokeless tobacco: Never Used     Comment: smoking cessation info given and reviewed   . Alcohol use 0.0 oz/week     Comment: seldom  . Drug use: No  . Sexual activity: Yes    Birth control/ protection: Surgical   Other Topics Concern  . Not on file   Social History Narrative  . No narrative on file       Review of Systems  All other systems reviewed and are negative.      Objective:   Physical Exam  Constitutional: She appears well-developed and well-nourished. No distress.  Cardiovascular: Normal rate, regular rhythm and normal heart sounds.   No murmur heard. Pulmonary/Chest: Effort normal. No respiratory distress. She has no wheezes. She has no rales.  Abdominal: Soft. Bowel sounds are normal. She exhibits no distension. There is no tenderness. There is no rebound and no guarding.  Musculoskeletal: She exhibits no edema.  Skin: She is not diaphoretic.  Vitals reviewed.         Assessment & Plan:  Hypothyroidism, unspecified type - Plan: [facility]  Hospital discharge follow-up - Plan: CBC with Differential/Platelet, COMPLETE METABOLIC PANEL WITH GFR  Chronic obstructive pulmonary disease, unspecified COPD type (HCC)  Hyponatremia  SIADH (syndrome of inappropriate ADH production) ([place])  I would like to recheck the patient's sodium level. If the sodium level is above 130 this is pretty typical for this patient. If sodium level is less than 130, we will need to institute fluid restriction to try to help address her  SIADH. I will also monitor a CBC. I will schedule the patient for pulmonary rehabilitation/physical therapy to try to improve her deconditioning. While checking her lab work, I will also check a TSH particular given the palpitations she is experiencing with exercise. However I believe the majority of this patient's  problem is deconditioning. I've encouraged her to drink a can of ensure twice a day to ensure that she is getting adequate protein

## 2016-11-02 ENCOUNTER — Encounter: Payer: Self-pay | Admitting: Family Medicine

## 2016-11-03 LAB — IRON AND TIBC
%SAT: 5 % — ABNORMAL LOW (ref 11–50)
Iron: 23 ug/dL — ABNORMAL LOW (ref 45–160)
TIBC: 505 ug/dL — ABNORMAL HIGH (ref 250–450)
UIBC: 482 ug/dL — ABNORMAL HIGH (ref 125–400)

## 2016-11-04 DIAGNOSIS — C3490 Malignant neoplasm of unspecified part of unspecified bronchus or lung: Secondary | ICD-10-CM | POA: Diagnosis not present

## 2016-11-04 DIAGNOSIS — R0602 Shortness of breath: Secondary | ICD-10-CM | POA: Diagnosis not present

## 2016-11-04 DIAGNOSIS — J9621 Acute and chronic respiratory failure with hypoxia: Secondary | ICD-10-CM | POA: Diagnosis not present

## 2016-11-04 DIAGNOSIS — R0902 Hypoxemia: Secondary | ICD-10-CM | POA: Diagnosis not present

## 2016-11-05 ENCOUNTER — Encounter: Payer: Self-pay | Admitting: Family Medicine

## 2016-11-06 ENCOUNTER — Encounter: Payer: Self-pay | Admitting: Family Medicine

## 2016-11-08 ENCOUNTER — Encounter: Payer: Self-pay | Admitting: Family Medicine

## 2016-11-09 ENCOUNTER — Other Ambulatory Visit: Payer: Self-pay | Admitting: Family Medicine

## 2016-11-13 ENCOUNTER — Encounter: Payer: Self-pay | Admitting: Family Medicine

## 2016-11-14 ENCOUNTER — Encounter: Payer: Self-pay | Admitting: Family Medicine

## 2016-11-14 MED ORDER — ONDANSETRON HCL 4 MG PO TABS: 4.0000 mg | ORAL_TABLET | Freq: Three times a day (TID) | ORAL | 0 refills | Status: DC | PRN

## 2016-11-15 ENCOUNTER — Encounter: Payer: Self-pay | Admitting: Family Medicine

## 2016-11-19 ENCOUNTER — Other Ambulatory Visit: Payer: PPO

## 2016-11-19 DIAGNOSIS — D649 Anemia, unspecified: Secondary | ICD-10-CM

## 2016-11-20 ENCOUNTER — Encounter: Payer: Self-pay | Admitting: Family Medicine

## 2016-11-20 LAB — FECAL OCCULT BLOOD, IMMUNOCHEMICAL
Fecal Occult Blood: NEGATIVE
Fecal Occult Blood: NEGATIVE
Fecal Occult Blood: POSITIVE — AB

## 2016-11-23 ENCOUNTER — Encounter: Payer: Self-pay | Admitting: Family Medicine

## 2016-11-26 ENCOUNTER — Encounter: Payer: Self-pay | Admitting: Family Medicine

## 2016-11-27 ENCOUNTER — Encounter: Payer: Self-pay | Admitting: Family Medicine

## 2016-11-27 ENCOUNTER — Telehealth (HOSPITAL_COMMUNITY): Payer: Self-pay | Admitting: *Deleted

## 2016-11-27 NOTE — Telephone Encounter (Signed)
What can I order for her, if anything? No labs were entered at her last visit.

## 2016-11-27 NOTE — Telephone Encounter (Signed)
None needed  TK

## 2016-11-27 NOTE — Telephone Encounter (Signed)
Patient states she wants to request the following lab: CBC. She states if she has it here it will save her a trip to [facility] [facility]. She has been hospitalized recently and needs her Hgb checked. Can I order this?

## 2016-11-27 NOTE — Telephone Encounter (Signed)
Yes. That will be fine!

## 2016-11-28 ENCOUNTER — Other Ambulatory Visit (HOSPITAL_COMMUNITY): Payer: Self-pay

## 2016-11-28 DIAGNOSIS — C3492 Malignant neoplasm of unspecified part of left bronchus or lung: Secondary | ICD-10-CM

## 2016-11-28 NOTE — Telephone Encounter (Signed)
Appointment made for labs at 10 am on 12/05/16. Patient verbalized understanding.

## 2016-11-29 ENCOUNTER — Encounter: Payer: Self-pay | Admitting: Family Medicine

## 2016-11-30 ENCOUNTER — Encounter: Payer: Self-pay | Admitting: Family Medicine

## 2016-12-02 DIAGNOSIS — J449 Chronic obstructive pulmonary disease, unspecified: Secondary | ICD-10-CM | POA: Diagnosis not present

## 2016-12-02 DIAGNOSIS — R0602 Shortness of breath: Secondary | ICD-10-CM | POA: Diagnosis not present

## 2016-12-04 ENCOUNTER — Encounter: Payer: Self-pay | Admitting: Family Medicine

## 2016-12-05 ENCOUNTER — Encounter (HOSPITAL_COMMUNITY): Payer: PPO

## 2016-12-05 ENCOUNTER — Encounter (HOSPITAL_COMMUNITY): Payer: Self-pay | Admitting: Oncology

## 2016-12-05 ENCOUNTER — Encounter (HOSPITAL_COMMUNITY): Payer: PPO | Attending: Oncology | Admitting: Oncology

## 2016-12-05 VITALS — BP 134/74 | HR 80 | Resp 16 | Ht 65.0 in | Wt 132.0 lb

## 2016-12-05 DIAGNOSIS — D509 Iron deficiency anemia, unspecified: Secondary | ICD-10-CM | POA: Insufficient documentation

## 2016-12-05 DIAGNOSIS — C3492 Malignant neoplasm of unspecified part of left bronchus or lung: Secondary | ICD-10-CM

## 2016-12-05 DIAGNOSIS — C3432 Malignant neoplasm of lower lobe, left bronchus or lung: Secondary | ICD-10-CM | POA: Diagnosis not present

## 2016-12-05 DIAGNOSIS — C3490 Malignant neoplasm of unspecified part of unspecified bronchus or lung: Secondary | ICD-10-CM | POA: Diagnosis not present

## 2016-12-05 DIAGNOSIS — K922 Gastrointestinal hemorrhage, unspecified: Secondary | ICD-10-CM

## 2016-12-05 DIAGNOSIS — D500 Iron deficiency anemia secondary to blood loss (chronic): Secondary | ICD-10-CM | POA: Diagnosis not present

## 2016-12-05 DIAGNOSIS — R0602 Shortness of breath: Secondary | ICD-10-CM | POA: Diagnosis not present

## 2016-12-05 DIAGNOSIS — J9621 Acute and chronic respiratory failure with hypoxia: Secondary | ICD-10-CM | POA: Diagnosis not present

## 2016-12-05 DIAGNOSIS — R0902 Hypoxemia: Secondary | ICD-10-CM | POA: Diagnosis not present

## 2016-12-05 HISTORY — DX: Iron deficiency anemia secondary to blood loss (chronic): D50.0

## 2016-12-05 LAB — CBC WITH DIFFERENTIAL/PLATELET
Basophils Absolute: 0 10*3/uL (ref 0.0–0.1)
Basophils Relative: 1 %
Eosinophils Absolute: 0.1 10*3/uL (ref 0.0–0.7)
Eosinophils Relative: 2 %
HCT: 35.9 % — ABNORMAL LOW (ref 36.0–46.0)
Hemoglobin: 11.7 g/dL — ABNORMAL LOW (ref 12.0–15.0)
Lymphocytes Relative: 21 %
Lymphs Abs: 1.4 10*3/uL (ref 0.7–4.0)
MCH: 25.7 pg — ABNORMAL LOW (ref 26.0–34.0)
MCHC: 32.6 g/dL (ref 30.0–36.0)
MCV: 78.9 fL (ref 78.0–100.0)
Monocytes Absolute: 0.5 10*3/uL (ref 0.1–1.0)
Monocytes Relative: 8 %
Neutro Abs: 4.5 10*3/uL (ref 1.7–7.7)
Neutrophils Relative %: 68 %
Platelets: 417 10*3/uL — ABNORMAL HIGH (ref 150–400)
RBC: 4.55 MIL/uL (ref 3.87–5.11)
RDW: 18.8 % — ABNORMAL HIGH (ref 11.5–15.5)
WBC: 6.5 10*3/uL (ref 4.0–10.5)

## 2016-12-05 LAB — IRON AND TIBC
Iron: 45 ug/dL (ref 28–170)
Saturation Ratios: 10 % — ABNORMAL LOW (ref 10.4–31.8)
TIBC: 442 ug/dL (ref 250–450)
UIBC: 397 ug/dL

## 2016-12-05 LAB — FERRITIN: Ferritin: 59 ng/mL (ref 11–307)

## 2016-12-05 NOTE — Assessment & Plan Note (Addendum)
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  She has been started on PO ferrous sulfate by primary care provider in Jan 2018.  She denies any intolerance issues to PO iron at this time.  She is taking 1 ferrous sulfate 325 mg daily.  I have reviewed the common side effects of this intervention including constipation, dark stool, nausea/vomiting, abdominal pain.  Labs today: CBC diff, iron/TIBC, ferritin.  I personally reviewed and went over laboratory results with the patient.  The results are noted within this dictation.  HGB is significantly improved, but still minimally anemic.  Platelet count is nearly normal as well.  Both indicative of improving iron deficiency.  I will send a copy of lab results to PCP when iron studies are reported.  If there is concern for intolerance issues or inadequate response to oral iron, we will be glad to see the patient back sooner than planned for consideration of IV iron replacement.  Based upon laboratory results today, I suspect she is going to experience a decent response to oral iron replacement therapy.  She denies any intolerance issues at this time.  Her iron studies may not be significantly improved at this point in time but her improvement in hemoglobin and thrombocytosis is certainly indicative of a response with oral iron replacement.

## 2016-12-05 NOTE — Assessment & Plan Note (Addendum)
Stage IB bronchoalveolar carcinoma,. Well-differentiated the left lower lobe status post resection on 12/21/2010. She had 14 lymph nodes negative and no perineural space invasion with clear margins and no LVI. Thus far she has no evidence for recurrent disease or new disease.  Labs today: CBC diff.  I personally reviewed and went over laboratory results with the patient.  The results are noted within this dictation.  I personally reviewed and went over radiographic studies with the patient.  The results are noted within this dictation.  CT chest in August 2017 was negative for any recurrence of disease.  Order is placed for CT chest wo contrast in August 2018 for annual surveillance in accordance with the NCCN guidelines.  Return in 6 months for follow-up.  If CT scan is stable, and the patient is doing well, we will decrease her frequency of follow-up appointments to every 12 months with CT imaging.

## 2016-12-05 NOTE — Progress Notes (Signed)
Ruth Fraction, MD [address] [place] [address]  Adenocarcinoma of left lung [facility]) - Plan: CT Chest Wo Contrast  Iron deficiency anemia due to chronic blood loss - Plan: Iron and TIBC, Ferritin  CURRENT THERAPY: Surveillance per NCCN guidelines  INTERVAL HISTORY: [name] 76 y.o. female returns for followup of Stage IB bronchoalveolar carcinoma,. Well-differentiated the left lower lobe status post resection on 12/21/2010. She had 14 lymph nodes negative and no perineural space invasion with clear margins and no LV I. Thus far she has no evidence for recurrent disease or new disease.      Adenocarcinoma of lung ([name])   12/21/2010 Initial Diagnosis    S/P left lower lobe resection for a well-differentiated Stage IB bronchoalveolar cancer. 0/14 lymph nodes.  No perineural invasion or LVI.  negative margins.      06/05/2016 Imaging    Although the previously described ground-glass attenuation nodule in the superior segment of the right lower lobe measures slightly larger on today's examination than prior studies, this is favored to be technique related. Today's study should serve as a baseline for future followup examinations. At this time, there is no central solid component to this lesion. No new nodules are noted. 2. Stable postoperative scarring in the left lower lobe and post infectious scarring throughout the visualize lung bases, as above. 3. Areas of cylindrical bronchiectasis throughout the lung bases bilaterally        From an oncology standpoint, she is doing well.  Her appetite is fair.  She notes that her appetite is actually improved.  Her weight is stable.  She denies any hemoptysis or cough.  She denies any unintentional weight loss.  She denies any new pains.  She was recently diagnosed with iron deficiency anemia and this is being managed by primary care physician.  She has been started on ferrous sulfate daily and she is  tolerating this well.  She is educated about the common side effects of ferrous sulfate.  She denies any intolerances at this time.  She denies any oncology complaints.  Review of Systems  Constitutional: Negative.  Negative for chills, fever and weight loss.  HENT: Negative.   Eyes: Negative.   Respiratory: Negative.  Negative for cough and hemoptysis.   Cardiovascular: Negative.  Negative for chest pain.  Gastrointestinal: Negative.  Negative for constipation, diarrhea, nausea and vomiting.  Genitourinary: Negative.   Musculoskeletal: Negative.   Skin: Negative.   Neurological: Negative.  Negative for weakness.  Endo/Heme/Allergies: Negative.   Psychiatric/Behavioral: Negative.     Past Medical History:  Diagnosis Date  . Adenocarcinoma of lung ([name]) 12/2010   left lung/surg only  . Allergic rhinitis   . 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 Anxiety disorder   . Aortic aneurysm ([place])   . Complication of anesthesia 2011   bloodpressure dropped during colonoscopy, not problems since  . Compression fracture   . COPD (chronic obstructive pulmonary disease) ([place])   . Depression   . Diastolic dysfunction   . Diverticulosis   . Dysphagia   . Emphysema lung ([place]) 11/08/2014  . Hiatal hernia   . Hypothyroidism   . IBS (irritable bowel syndrome)   . Iron deficiency anemia due to chronic blood loss 12/05/2016  . On home O2 12/15/2013   chronic hypoxia  . PUD (peptic ulcer disease)    40yr  . Shortness of breath  with exertion  . SIADH (syndrome of inappropriate ADH production) ([place])   . Trigeminal neuralgia     Past Surgical History:  Procedure Laterality Date  . ABDOMINAL HYSTERECTOMY    . BACK SURGERY  January 13, 2014  . CATARACT EXTRACTION    . CATARACT EXTRACTION W/PHACO  09/02/2012   Procedure: CATARACT EXTRACTION PHACO AND INTRAOCULAR LENS PLACEMENT (IOC);  Surgeon: Elta Guadeloupe T. Gershon Crane, MD;  Location: [facility] ORS;  Service: Ophthalmology;   Laterality: Left;  CDE:10.35  . CHOLECYSTECTOMY    . 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 COLONOSCOPY N/A 11/23/2014   Procedure: COLONOSCOPY;  Surgeon: Danie Binder, MD;  Location: [facility] ENDO SUITE;  Service: Endoscopy;  Laterality: N/A;  930   . ENDARTERECTOMY Right 08/21/2013   Procedure: RIGHT CAROTID ANEURYSM RESECTION;  Surgeon: Rosetta Posner, MD;  Location: [facility] OR;  Service: Vascular;  Laterality: Right;  . ESOPHAGOGASTRODUODENOSCOPY  11/13/2009   w/dilation to 71m, gastric ulceration (H.Pylori) s/p treatment  . ESOPHAGOGASTRODUODENOSCOPY  11/21/2009   distal esophageal web, gastritis  . ESOPHAGOGASTRODUODENOSCOPY  03/14/12   SBTD:VVOHYWVPXin the distal esophagus/Mild gastritis/small HH. + H.pylori, prescribed Pylera. Finished treatment.   . fatty tumor removal from lt groin    . FRACTURE SURGERY Left    hand and left ring finger  . LUNG CANCER SURGERY  12/2010   Left VATS, minithoracotomy, LLL superior segmentectomy  . ORIF WRIST FRACTURE Left 04/09/2014   Procedure: OPEN REDUCTION INTERNAL FIXATION (ORIF) WRIST FRACTURE;  Surgeon: TRenette Butters MD;  Location: [facility]  Service: Orthopedics;  Laterality: Left;  . PERCUTANEOUS PINNING Left 04/09/2014   Procedure: PERCUTANEOUS PINNING EXTREMITY;  Surgeon: TRenette Butters MD;  [facility]: [facility]  Service: Orthopedics;  Laterality: Left;  . TUBAL LIGATION    . vocal cord surgery  02/06/2011   laryngoscopy with bilateral vocal cord Radiesse injection for vocal cord paralysis  . YAG LASER APPLICATION Left 110/62/6948  Procedure: YAG LASER APPLICATION;  Surgeon: MRutherford Guys MD;  Location: [facility];  Service: Ophthalmology;  Laterality: Left;    Family History  Problem Relation Age of Onset  . Pulmonary embolism Mother   . Heart attack Father   . Hypertension Father   . Liver cancer Sister   . Cancer Sister   . Cancer Brother   . Cancer Daughter   . Colon cancer Neg Hx     Social History    Social History  . Marital status: Divorced    Spouse name: N/A  . Number of children: N/A  . Years of education: N/A   Social History Main Topics  . Smoking status: Former Smoker    Packs/day: 0.50    Years: 20.00    Types: Cigarettes    Quit date: 12/21/2010  . Smokeless tobacco: Never Used     Comment: smoking cessation info given and reviewed   . Alcohol use 0.0 oz/week     Comment: seldom  . Drug use: No  . Sexual activity: Yes    Birth control/ protection: Surgical   Other Topics Concern  . None   Social History Narrative  . None     PHYSICAL EXAMINATION  ECOG PERFORMANCE STATUS: 1 - Symptomatic but completely ambulatory  Vitals:   12/05/16 1204  BP: 134/74  Pulse: 80  Resp: 16    GENERAL:alert, no distress, well nourished, well developed, comfortable, cooperative, smiling and unaccompanied. SKIN: skin color, texture, turgor are normal, no rashes  or significant lesions HEAD: Normocephalic, No masses, lesions, tenderness or abnormalities EYES: normal, EOMI, Conjunctiva are pink and non-injected EARS: External ears normal OROPHARYNX:lips, buccal mucosa, and tongue normal and mucous membranes are moist  NECK: supple, trachea midline LYMPH:  no palpable lymphadenopathy BREAST:not examined LUNGS: clear to auscultation  HEART: regular rate & rhythm ABDOMEN:abdomen soft and normal bowel sounds BACK: Back symmetric, no curvature. EXTREMITIES:less then 2 second capillary refill, no joint deformities, effusion, or inflammation, no skin discoloration, no cyanosis  NEURO: alert & oriented x 3 with fluent speech, no focal motor/sensory deficits, walker for stability.   LABORATORY DATA: CBC    Component Value Date/Time   WBC 6.5 12/05/2016 0955   RBC 4.55 12/05/2016 0955   HGB 11.7 (L) 12/05/2016 0955   HCT 35.9 (L) 12/05/2016 0955   PLT 417 (H) 12/05/2016 0955   MCV 78.9 12/05/2016 0955   MCH 25.7 (L) 12/05/2016 0955   MCHC 32.6 12/05/2016 0955   RDW 18.8  (H) 12/05/2016 0955   LYMPHSABS 1.4 12/05/2016 0955   MONOABS 0.5 12/05/2016 0955   EOSABS 0.1 12/05/2016 0955   BASOSABS 0.0 12/05/2016 0955      Chemistry      Component Value Date/Time   NA 132 (L) 11/01/2016 1056   K 4.6 11/01/2016 1056   CL 96 (L) 11/01/2016 1056   CO2 25 11/01/2016 1056   BUN 9 11/01/2016 1056   CREATININE 0.40 (L) 11/01/2016 1056      Component Value Date/Time   CALCIUM 9.7 11/01/2016 1056   ALKPHOS 78 11/01/2016 1056   AST 15 11/01/2016 1056   ALT 11 11/01/2016 1056   BILITOT 0.3 11/01/2016 1056     Lab Results  Component Value Date   IRON 23 (L) 11/01/2016   TIBC 505 (H) 11/01/2016     PENDING LABS:   RADIOGRAPHIC STUDIES:  No results found.   PATHOLOGY:    ASSESSMENT AND PLAN:  Adenocarcinoma of lung ([place]) Stage IB bronchoalveolar carcinoma,. Well-differentiated the left lower lobe status post resection on 12/21/2010. She had 14 lymph nodes negative and no perineural space invasion with clear margins and no LVI. Thus far she has no evidence for recurrent disease or new disease.  Labs today: CBC diff.  I personally reviewed and went over laboratory results with the patient.  The results are noted within this dictation.  I personally reviewed and went over radiographic studies with the patient.  The results are noted within this dictation.  CT chest in August 2017 was negative for any recurrence of disease.  Order is placed for CT chest wo contrast in August 2018 for annual surveillance in accordance with the NCCN guidelines.  Return in 6 months for follow-up.  If CT scan is stable, and the patient is doing well, we will decrease her frequency of follow-up appointments to every 12 months with CT imaging.  Iron deficiency anemia due to chronic blood loss Iron deficiency anemia, secondary to presumed blood loss with 1 positive fecal occult stool testing in Jan 2018.  She has been started on PO ferrous sulfate by primary care provider in  Jan 2018.  She denies any intolerance issues to PO iron at this time.  She is taking 1 ferrous sulfate 325 mg daily.  I have reviewed the common side effects of this intervention including constipation, dark stool, nausea/vomiting, abdominal pain.  Labs today: CBC diff, iron/TIBC, ferritin.  I personally reviewed and went over laboratory results with the patient.  The results are  noted within this dictation.  HGB is significantly improved, but still minimally anemic.  Platelet count is nearly normal as well.  Both indicative of improving iron deficiency.  I will send a copy of lab results to PCP when iron studies are reported.  If there is concern for intolerance issues or inadequate response to oral iron, we will be glad to see the patient back sooner than planned for consideration of IV iron replacement.  Based upon laboratory results today, I suspect she is going to experience a decent response to oral iron replacement therapy.  She denies any intolerance issues at this time.  Her iron studies may not be significantly improved at this point in time but her improvement in hemoglobin and thrombocytosis is certainly indicative of a response with oral iron replacement.   ORDERS PLACED FOR THIS ENCOUNTER: Orders Placed This Encounter  Procedures  . CT Chest Wo Contrast  . Iron and TIBC  . Ferritin    MEDICATIONS PRESCRIBED THIS ENCOUNTER: No orders of the defined types were placed in this encounter.   THERAPY PLAN:  NCCN guidelines for Non-Small Cell Lung Cancer Surveillance in the setting of clinical/radiographic remission are as follows (5.2017):  A. Stage I-II (primary treatment included surgery +/- chemotherapy):   1. H+P and chest CT +/- contrast every 6 months for 2-3 years, then H+P and low-dose non-contrast-enhanced chest CT annually  B. Stage I-II (primary treatment included RT) or Stage III or Stage IV (oligometastatic with all sites treated with definitive intent)   1. H+P and  chest CT +/- contrast every 3-6 months for 3 years, then H+P and chest CT +/- contrast every 6 months for 2 years, then H+P and low-dose non-contrast-enhanced chest CT annually    A. Residual or new radiographic abnormalities may require more frequent imaging  C. Smoking cessation advice, counseling, and pharmacotherapy  D. PET/CT or Brain MRI is not routinely indicated.   All questions were answered. The patient knows to call the clinic with any problems, questions or concerns. We can certainly see the patient much sooner if necessary.  Patient and plan discussed with Dr. Twana First and she is in agreement with the aforementioned.   This note is electronically signed by: Doy Mince 12/05/2016 1:31 PM

## 2016-12-05 NOTE — Patient Instructions (Signed)
[place] at [facility] Discharge Instructions  RECOMMENDATIONS MADE BY THE CONSULTANT AND ANY TEST RESULTS WILL BE SENT TO YOUR REFERRING PHYSICIAN.  You were seen today by Kirby Crigler PA-C.  CT chest in August of this year. Return in August for follow up and CT results.   Thank you for choosing [place] at [facility] to provide your oncology and hematology care.  To afford each patient quality time with our provider, please arrive at least 15 minutes before your scheduled appointment time.    If you have a lab appointment with the [facility] please come in thru the  Main Entrance and check in at the main information desk  You need to re-schedule your appointment should you arrive 10 or more minutes late.  We strive to give you quality time with our providers, and arriving late affects you and other patients whose appointments are after yours.  Also, if you no show three or more times for appointments you may be dismissed from the clinic at the providers discretion.     Again, thank you for choosing [facility].  Our hope is that these requests will decrease the amount of time that you wait before being seen by our physicians.       _____________________________________________________________  Should you have questions after your visit to [facility], please contact our office at [phone] between the hours of 8:30 a.m. and 4:30 p.m.  Voicemails left after 4:30 p.m. will not be returned until the following business day.  For prescription refill requests, have your pharmacy contact our office.       Resources For Cancer Patients and their Caregivers ? American Cancer Society: Can assist with transportation, wigs, general needs, runs Look Good Feel Better.        (615)255-5994 ? Cancer Care: Provides financial assistance, online support groups, medication/co-pay assistance.  1-800-813-HOPE  (256) 561-6034) ? [place] Assists [place] cancer patients and their families through emotional , educational and financial support.  941-419-5287 ? [place] DSS Where to apply for food stamps, Medicaid and utility assistance. 6617054542 ? [facility]: Transportation to medical appointments. 208-016-2191 ? Social Security Administration: May apply for disability if have a Stage IV cancer. (450) 122-4469 (213)867-2067 ? [employer], Disability and Transit Services: Assists with nutrition, care and transit needs. [place] Support Programs: '@10RELATIVEDAYS'$ @ > Cancer Support Group  2nd Tuesday of the month 1pm-2pm, Journey Room  > Creative Journey  3rd Tuesday of the month 1130am-1pm, Journey Room  > Look Good Feel Better  1st Wednesday of the month 10am-12 noon, Journey Room (Call [place] to register [phone])

## 2016-12-06 ENCOUNTER — Other Ambulatory Visit: Payer: PPO

## 2016-12-07 ENCOUNTER — Encounter: Payer: Self-pay | Admitting: Family Medicine

## 2016-12-07 ENCOUNTER — Other Ambulatory Visit: Payer: PPO

## 2016-12-10 ENCOUNTER — Encounter: Payer: Self-pay | Admitting: Family Medicine

## 2016-12-21 ENCOUNTER — Encounter: Payer: Self-pay | Admitting: Family Medicine

## 2016-12-26 ENCOUNTER — Telehealth: Payer: Self-pay | Admitting: Family Medicine

## 2016-12-26 NOTE — Telephone Encounter (Signed)
Requesting a refill on Flexeril and Xanax - Ok to refill??  Flexeril was DC'd on 09/21/16 but pt states that she still need to take it occasionally. Ok to send to mail order?

## 2016-12-27 ENCOUNTER — Encounter: Payer: Self-pay | Admitting: Family Medicine

## 2016-12-27 MED ORDER — CYCLOBENZAPRINE HCL 10 MG PO TABS: 10.0000 mg | ORAL_TABLET | Freq: Two times a day (BID) | ORAL | 1 refills | Status: DC | PRN

## 2016-12-27 MED ORDER — ALPRAZOLAM 0.5 MG PO TABS: 0.5000 mg | ORAL_TABLET | Freq: Two times a day (BID) | ORAL | 2 refills | Status: DC | PRN

## 2016-12-27 NOTE — Telephone Encounter (Signed)
ok 

## 2016-12-27 NOTE — Telephone Encounter (Signed)
Xanax called to [facility] and pt aware

## 2016-12-27 NOTE — Telephone Encounter (Signed)
Flexeril sent to mail order and message left for pt as to what pharm she would like her xanax called into.

## 2016-12-30 DIAGNOSIS — J449 Chronic obstructive pulmonary disease, unspecified: Secondary | ICD-10-CM | POA: Diagnosis not present

## 2016-12-30 DIAGNOSIS — R0602 Shortness of breath: Secondary | ICD-10-CM | POA: Diagnosis not present

## 2017-01-01 ENCOUNTER — Ambulatory Visit (INDEPENDENT_AMBULATORY_CARE_PROVIDER_SITE_OTHER): Payer: PPO | Admitting: Family Medicine

## 2017-01-01 DIAGNOSIS — M810 Age-related osteoporosis without current pathological fracture: Secondary | ICD-10-CM

## 2017-01-01 MED ORDER — DENOSUMAB 60 MG/ML ~~LOC~~ SOLN
60.0000 mg | Freq: Once | SUBCUTANEOUS | Status: AC
  Administered 2017-01-01: 60 mg via SUBCUTANEOUS

## 2017-01-01 NOTE — Patient Instructions (Signed)
Prolia injection given - left arm - pt waited approx 10 minutes with no obvious reactions.

## 2017-01-02 DIAGNOSIS — C3490 Malignant neoplasm of unspecified part of unspecified bronchus or lung: Secondary | ICD-10-CM | POA: Diagnosis not present

## 2017-01-02 DIAGNOSIS — R0902 Hypoxemia: Secondary | ICD-10-CM | POA: Diagnosis not present

## 2017-01-02 DIAGNOSIS — J9621 Acute and chronic respiratory failure with hypoxia: Secondary | ICD-10-CM | POA: Diagnosis not present

## 2017-01-02 DIAGNOSIS — R0602 Shortness of breath: Secondary | ICD-10-CM | POA: Diagnosis not present

## 2017-01-03 ENCOUNTER — Encounter (HOSPITAL_COMMUNITY): Payer: PPO

## 2017-01-03 ENCOUNTER — Encounter: Payer: Self-pay | Admitting: Vascular Surgery

## 2017-01-04 ENCOUNTER — Encounter: Payer: Self-pay | Admitting: Family Medicine

## 2017-01-09 ENCOUNTER — Encounter: Payer: Self-pay | Admitting: Family Medicine

## 2017-01-14 IMAGING — DX DG CHEST 2V
2 series · 2 of 2 positions shown · non-contrast
Comparison: PA and lateral chest x-ray September 27, 2014

CLINICAL DATA: Shortness of breath and dizziness, abnormal chest
exam

EXAM:
CHEST  2 VIEW

[chest pa]
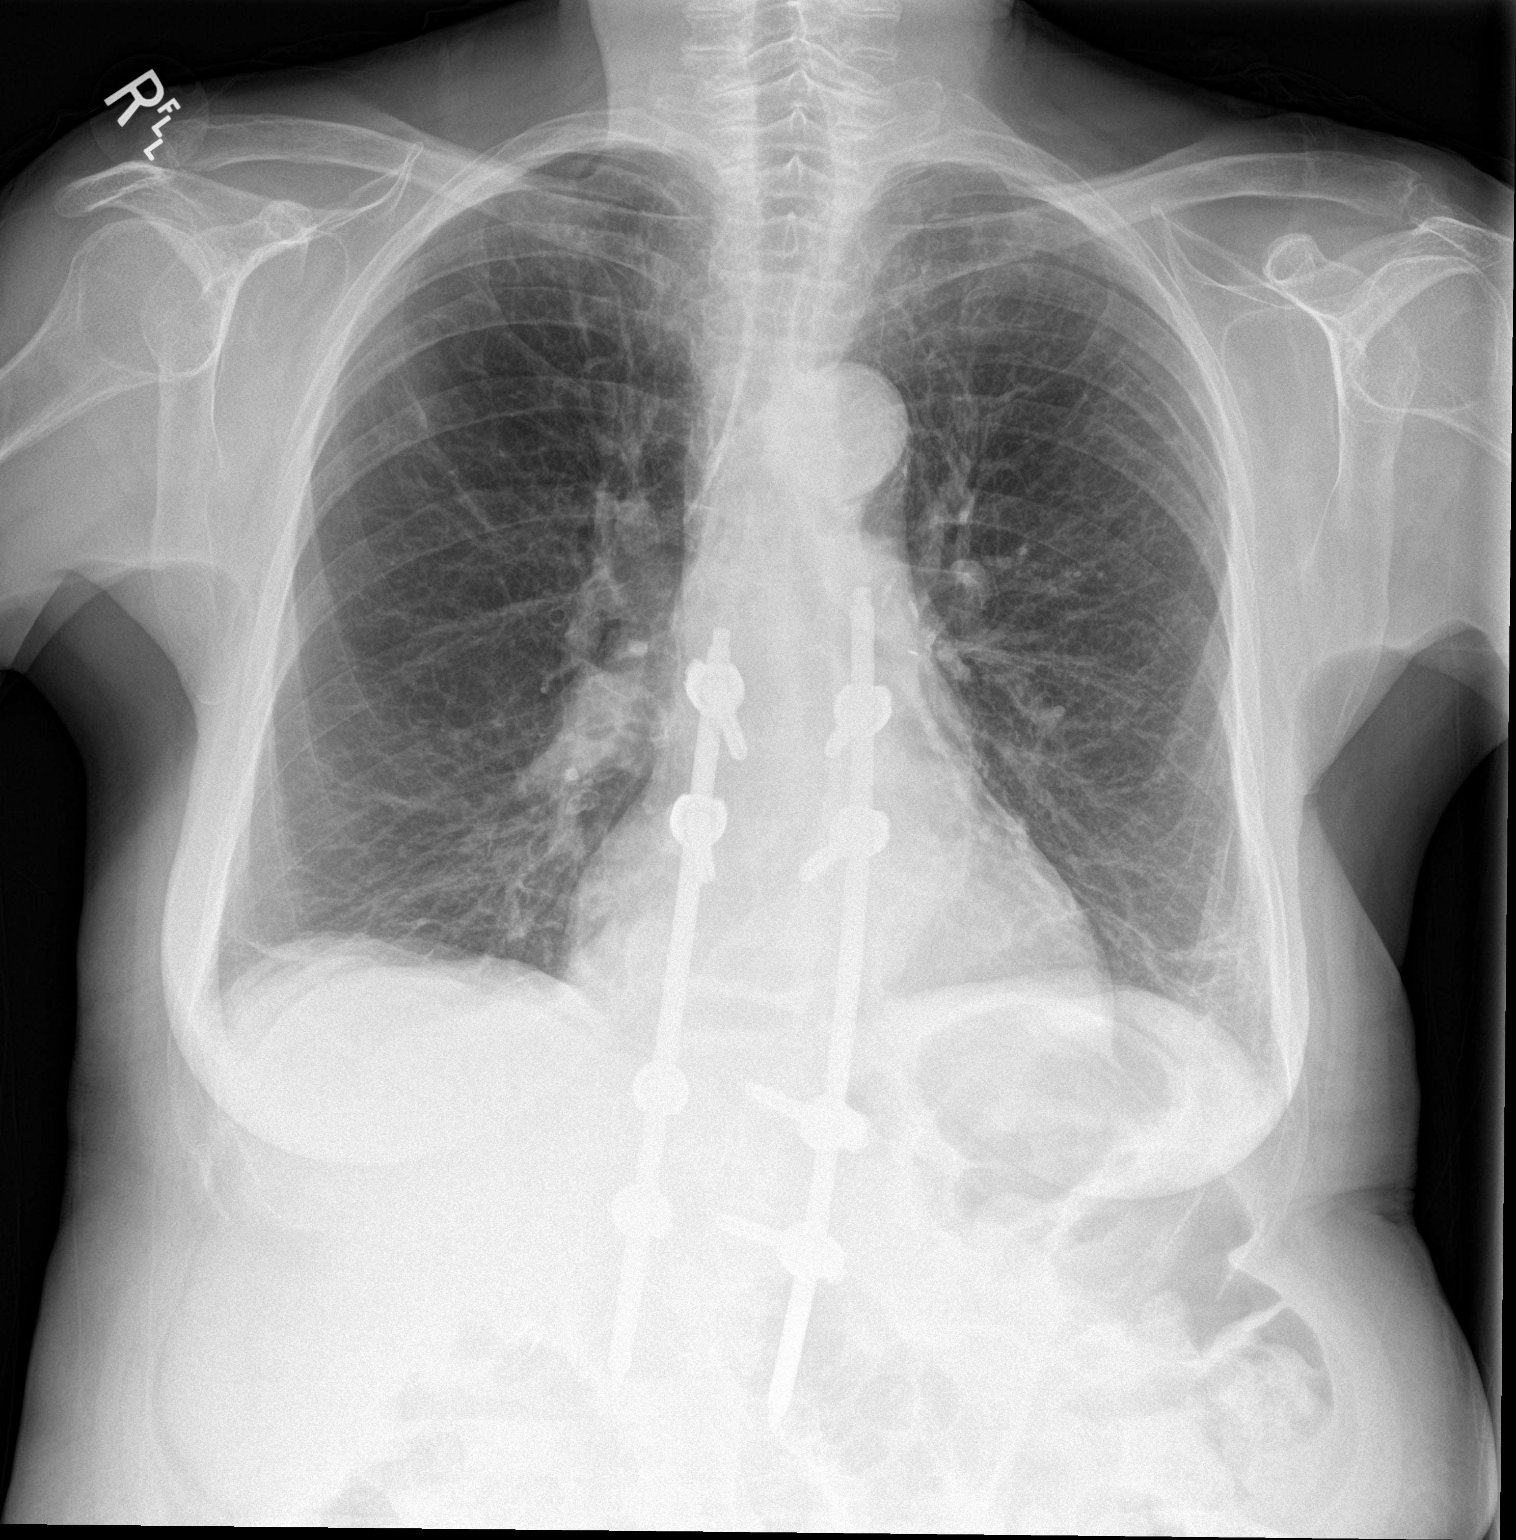

[chest lat]
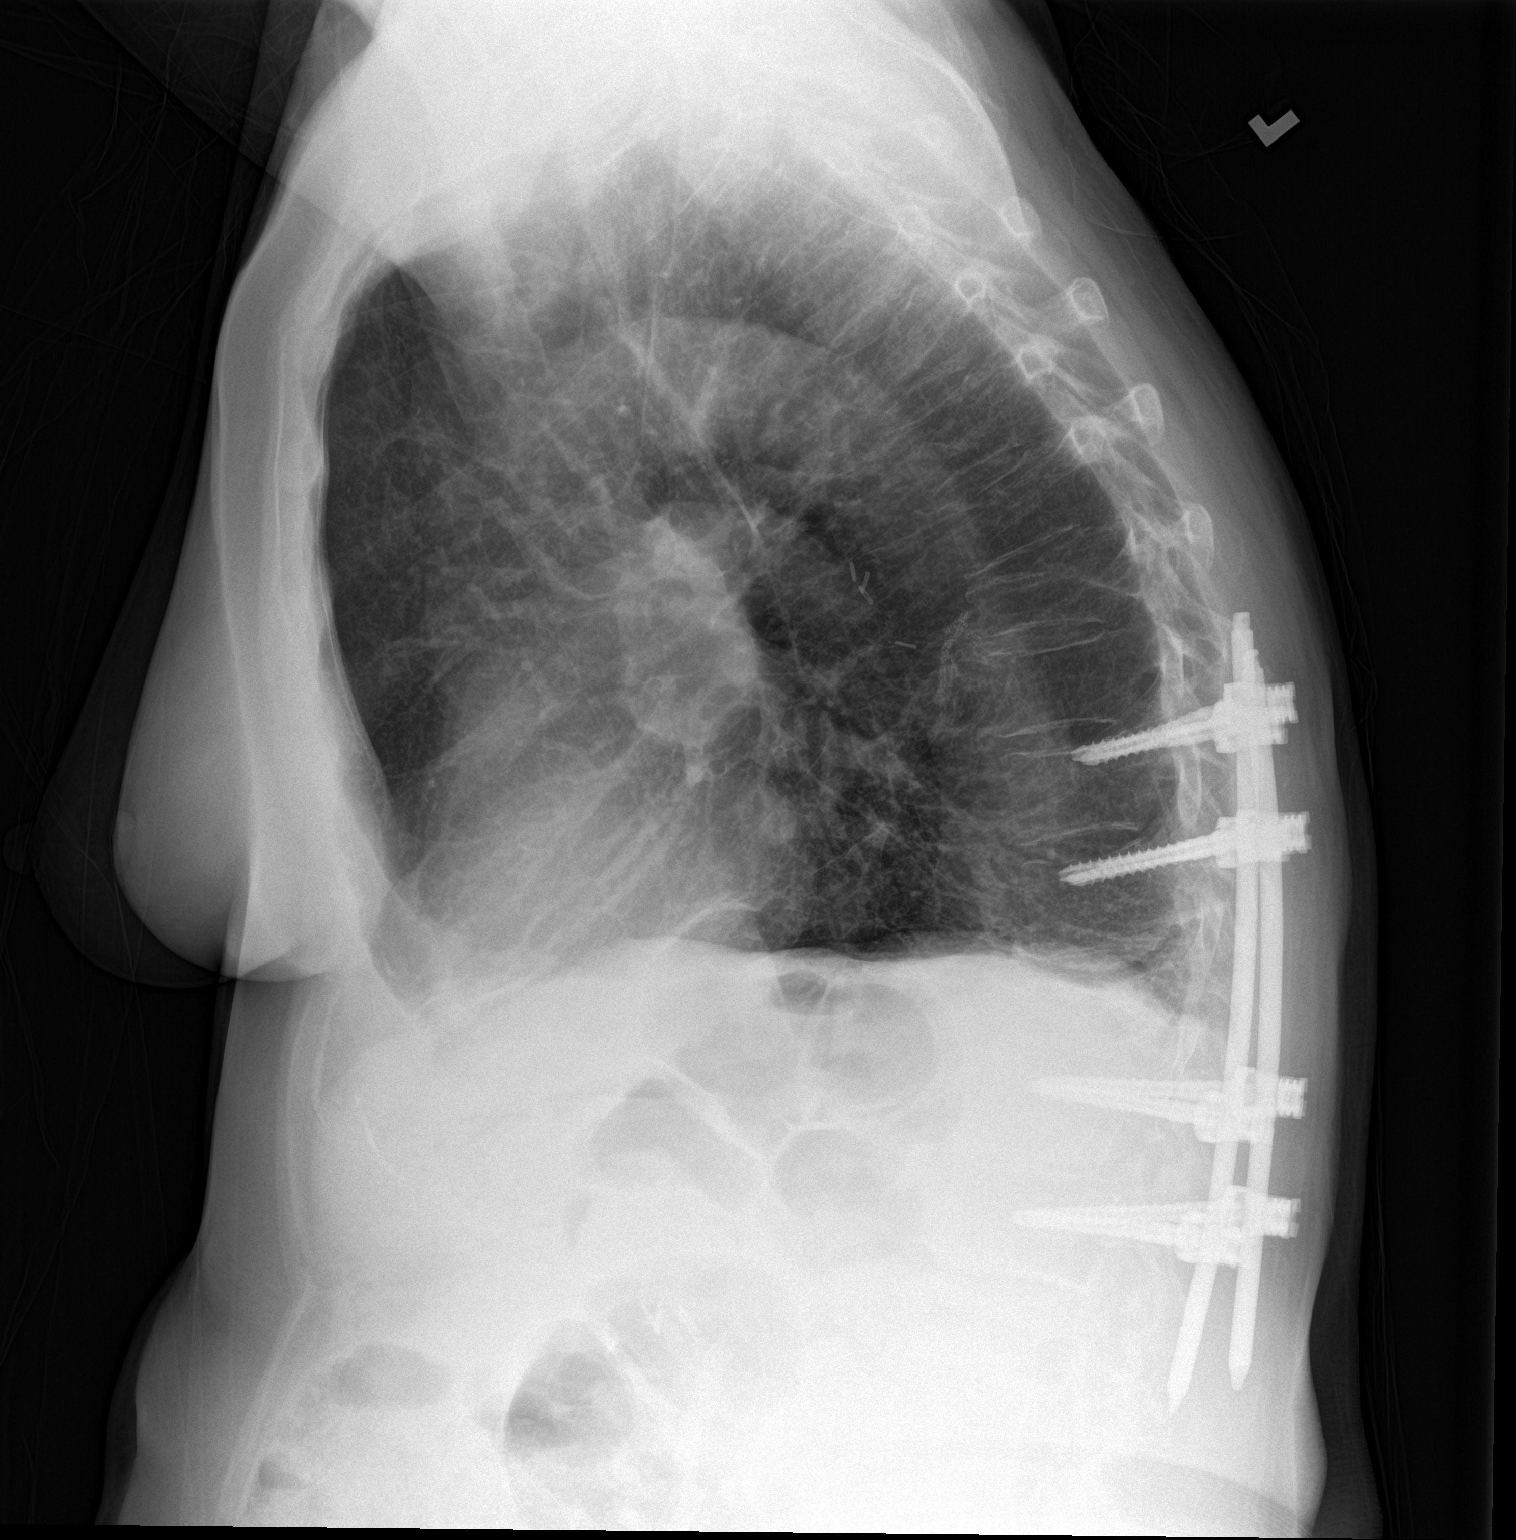

[2 of 2 positions shown; findings below may reference images not displayed]

FINDINGS: The lungs are mildly hyperinflated with hemidiaphragm flattening.
There is minimal scarring versus subsegmental atelectasis at both
lung bases. Previously demonstrated lower lobe infiltrate on the
left has resolved. The heart is normal in size. The pulmonary
vascularity is not engorged. There is tortuosity of the descending
thoracic aorta. The patient has undergone previous posterior fusion
of the thoracolumbar junction from T10- L2 due to T12 wedge
compression. There is a stable unfused wedge compression of
approximately T8.
IMPRESSION: COPD with bibasilar scarring or subsegmental atelectasis. There is
no pneumonia nor CHF.

## 2017-01-14 IMAGING — DX DG LUMBAR SPINE COMPLETE 4+V
5 series · 5 of 5 positions shown · non-contrast
Comparison: 11/19/2014 abdominal pelvic CT. 01/13/2014 and prior
radiographs

CLINICAL DATA: 73-year-old female with lumbar pain. History of
recent falls. Patient with thoracolumbar fusion.

EXAM:
LUMBAR SPINE - COMPLETE 4+ VIEW

[l-spine ap]
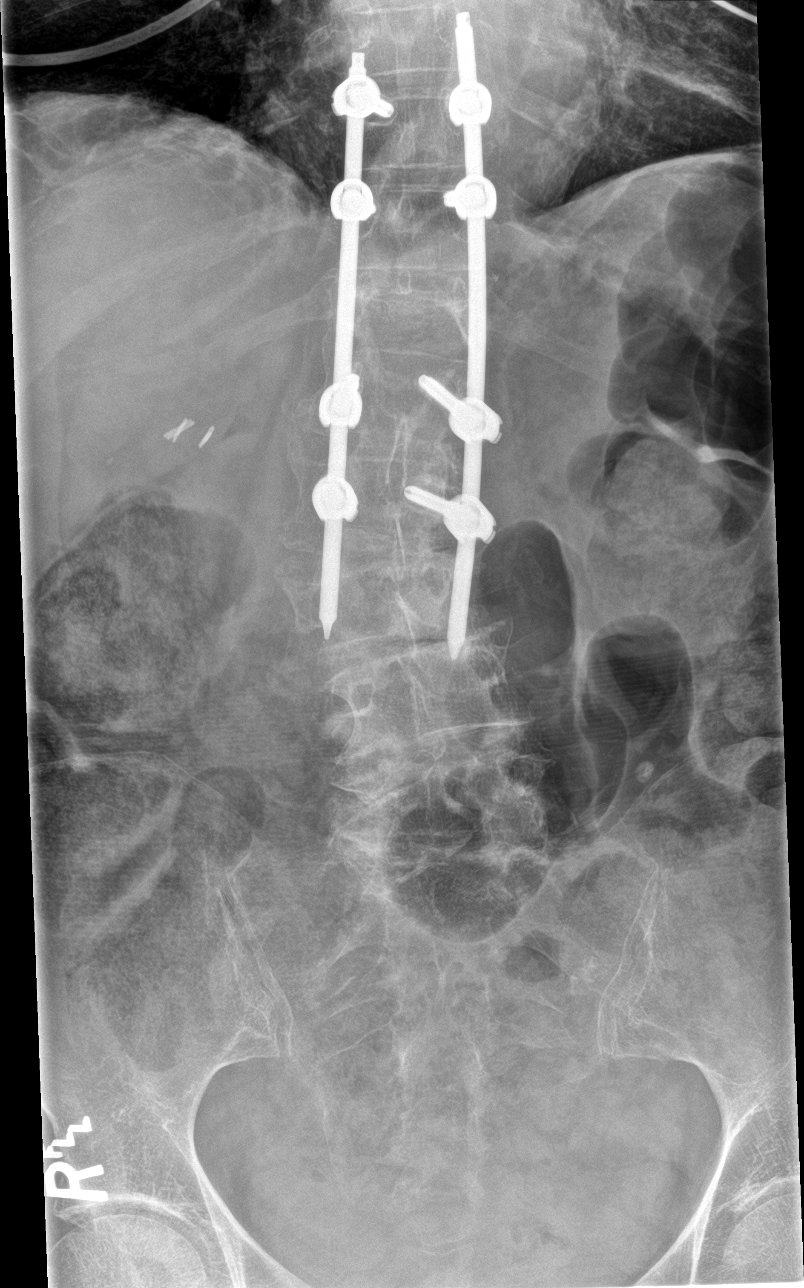

[l-spine obl (1 of 2)]
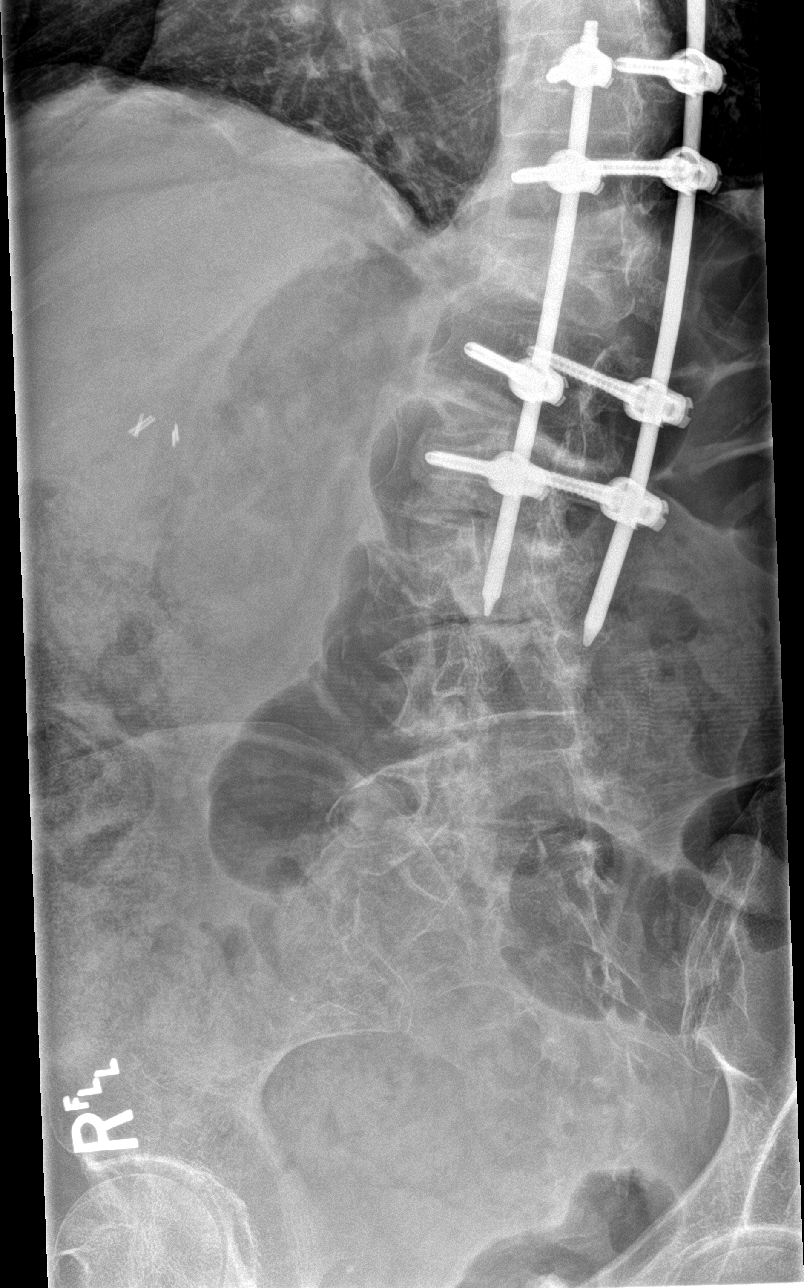

[l-spine obl (2 of 2)]
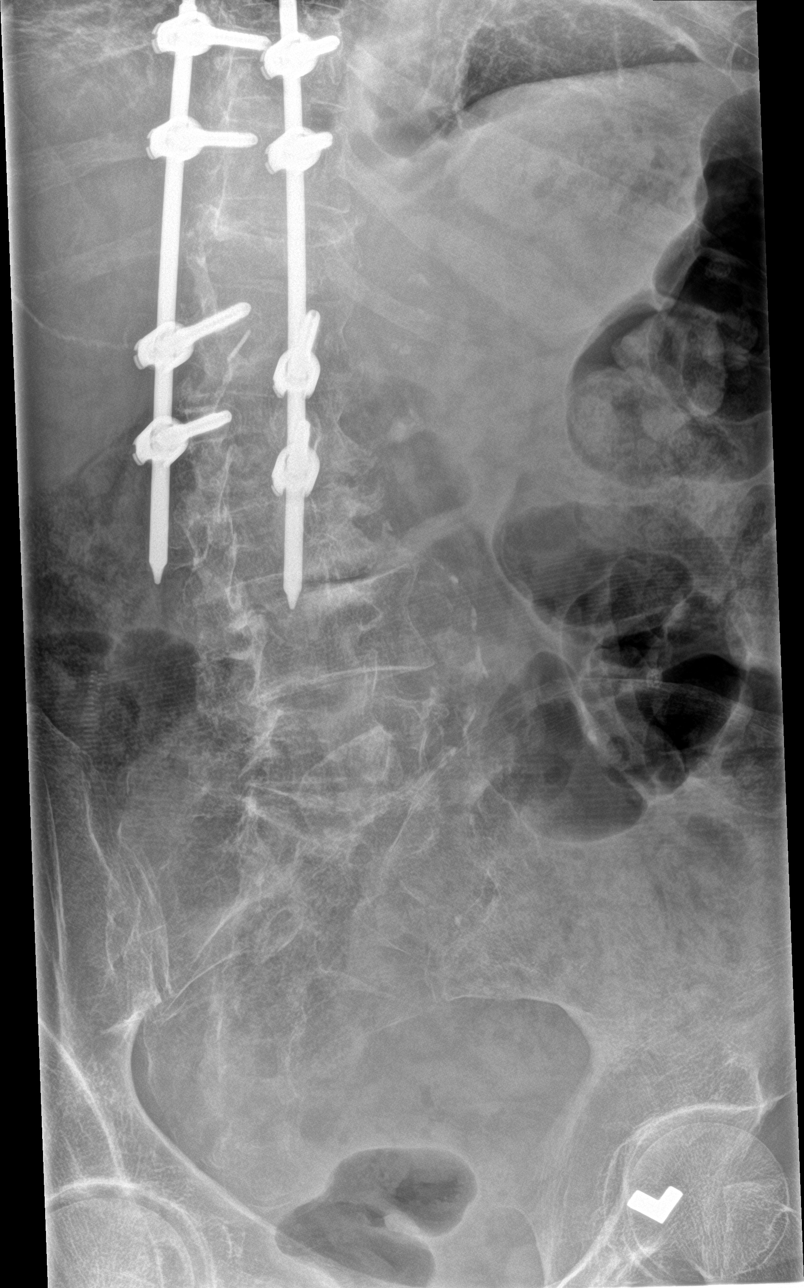

[l-spine lat]
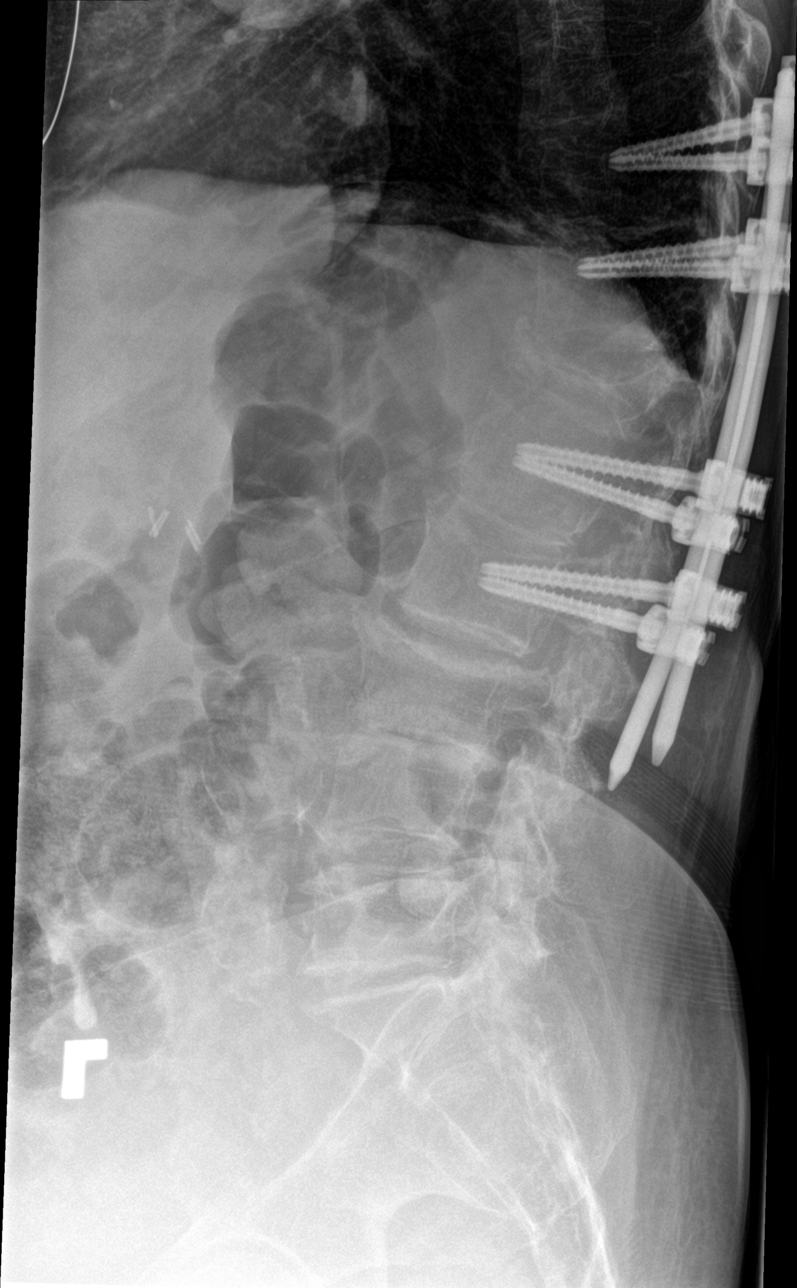

[l-spine spot]
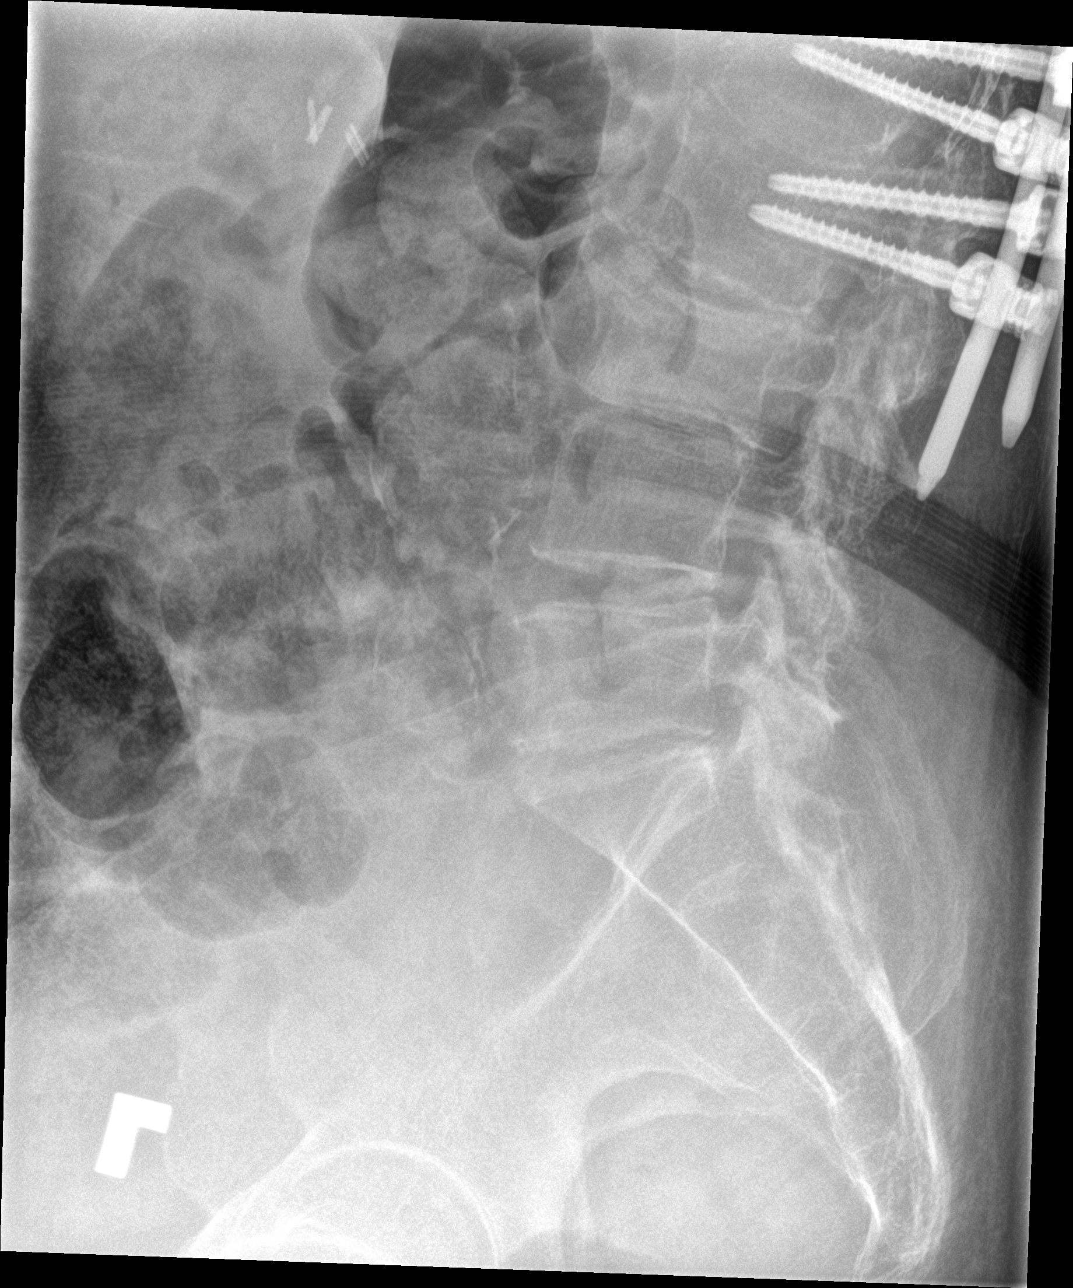

[5 of 5 positions shown; findings below may reference images not displayed]

FINDINGS: Posterior rod and pedicular screw fixation is again identified from
T10-L2.

A 90% T12 compression fracture is unchanged.

Moderate -severe degenerative disc disease at L2-3 and L4-5 again
noted as well as mild- moderate degenerative disc disease/facet
arthropathy within the lower lumbar spine.

There is no evidence of acute fracture.

A moderate apex right scoliosis is noted with unchanged 12 mm right
subluxation of L3 on L4.
IMPRESSION: No evidence of acute abnormality.

Unchanged T12 compression fracture and 12 mm right subluxation of L3
on L4.

Multilevel degenerative changes, lumbar scoliosis and posterior
fusion from T10-L2 again noted.

## 2017-01-30 DIAGNOSIS — J449 Chronic obstructive pulmonary disease, unspecified: Secondary | ICD-10-CM | POA: Diagnosis not present

## 2017-01-30 DIAGNOSIS — R0602 Shortness of breath: Secondary | ICD-10-CM | POA: Diagnosis not present

## 2017-02-02 DIAGNOSIS — R0602 Shortness of breath: Secondary | ICD-10-CM | POA: Diagnosis not present

## 2017-02-02 DIAGNOSIS — J9621 Acute and chronic respiratory failure with hypoxia: Secondary | ICD-10-CM | POA: Diagnosis not present

## 2017-02-02 DIAGNOSIS — C3490 Malignant neoplasm of unspecified part of unspecified bronchus or lung: Secondary | ICD-10-CM | POA: Diagnosis not present

## 2017-02-02 DIAGNOSIS — R0902 Hypoxemia: Secondary | ICD-10-CM | POA: Diagnosis not present

## 2017-02-06 DIAGNOSIS — J3489 Other specified disorders of nose and nasal sinuses: Secondary | ICD-10-CM | POA: Diagnosis not present

## 2017-02-07 ENCOUNTER — Encounter: Payer: Self-pay | Admitting: Pulmonary Disease

## 2017-02-08 NOTE — Telephone Encounter (Signed)
AD please advise.

## 2017-02-13 ENCOUNTER — Encounter: Payer: Self-pay | Admitting: Family Medicine

## 2017-03-01 DIAGNOSIS — R0602 Shortness of breath: Secondary | ICD-10-CM | POA: Diagnosis not present

## 2017-03-01 DIAGNOSIS — J449 Chronic obstructive pulmonary disease, unspecified: Secondary | ICD-10-CM | POA: Diagnosis not present

## 2017-03-04 DIAGNOSIS — R0602 Shortness of breath: Secondary | ICD-10-CM | POA: Diagnosis not present

## 2017-03-04 DIAGNOSIS — C3490 Malignant neoplasm of unspecified part of unspecified bronchus or lung: Secondary | ICD-10-CM | POA: Diagnosis not present

## 2017-03-04 DIAGNOSIS — R0902 Hypoxemia: Secondary | ICD-10-CM | POA: Diagnosis not present

## 2017-03-04 DIAGNOSIS — J9621 Acute and chronic respiratory failure with hypoxia: Secondary | ICD-10-CM | POA: Diagnosis not present

## 2017-03-08 ENCOUNTER — Encounter: Payer: Self-pay | Admitting: Family Medicine

## 2017-03-11 IMAGING — CT CT CHEST W/O CM
2 of 3 series · 15 of 36 positions shown, 18 images · non-contrast
Comparison: Most recent chest CT from 10/28/2014. Chest radiograph
from 03/14/2015.

CLINICAL DATA: 73-year-old female former smoker with a history of
lung cancer (left lower lobe adenocarcinoma) diagnosed in 2152,
presenting for restaging. Nodular pleural parenchymal thickening at
the left lung base on prior CT study.

EXAM:
CT CHEST WITHOUT CONTRAST
TECHNIQUE: Multidetector CT imaging of the chest was performed following the
standard protocol without IV contrast.

[Series 2: chestroutine 5.0 b40f · axial · 0.66mm/px · z∈[+1008,+1273]mm · 12 of 63 slices shown, 15 images]
[im 5/63  mediastinal]
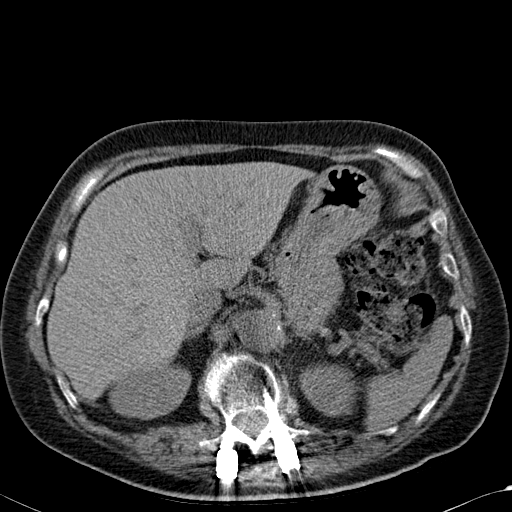
[im 5/63  lung]
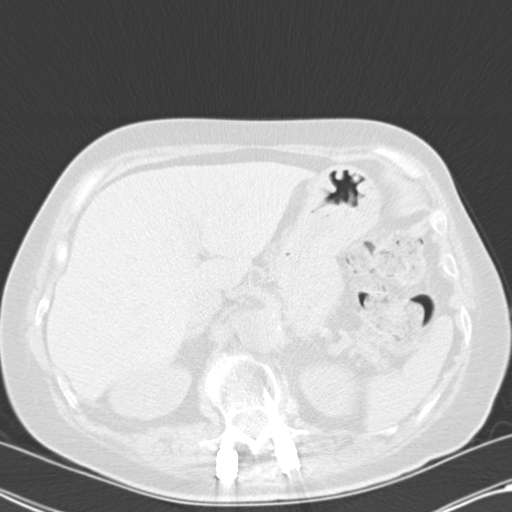
[im 10/63  lung]
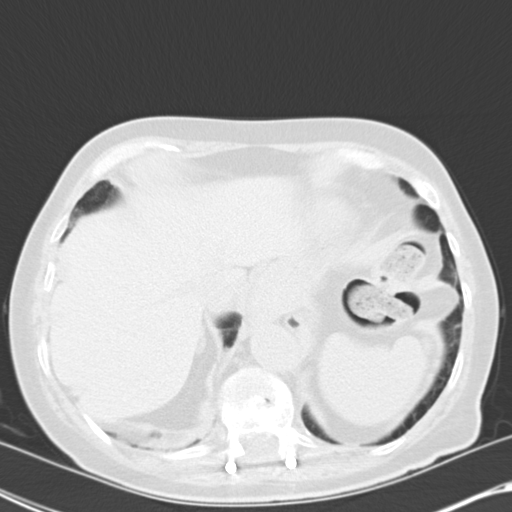
[im 14/63  lung]
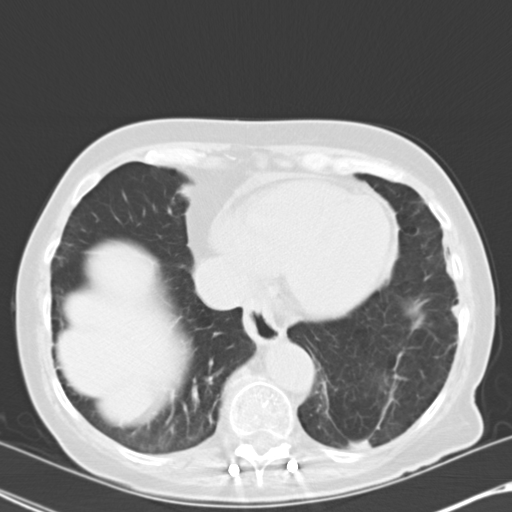
[im 19/63  lung]
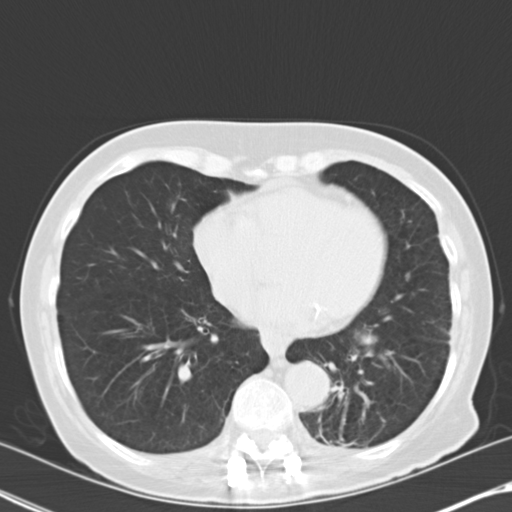
[im 23/63  mediastinal]
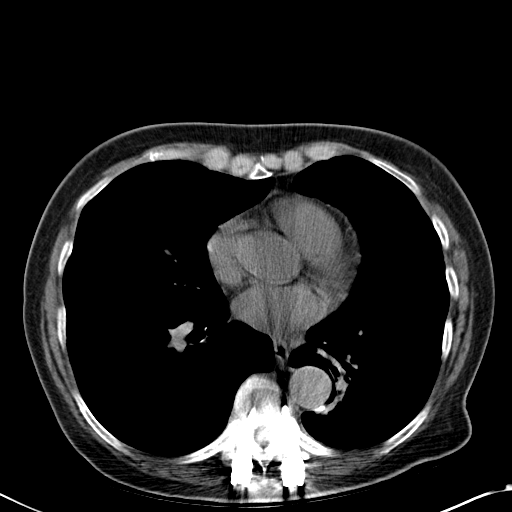
[im 23/63  lung]
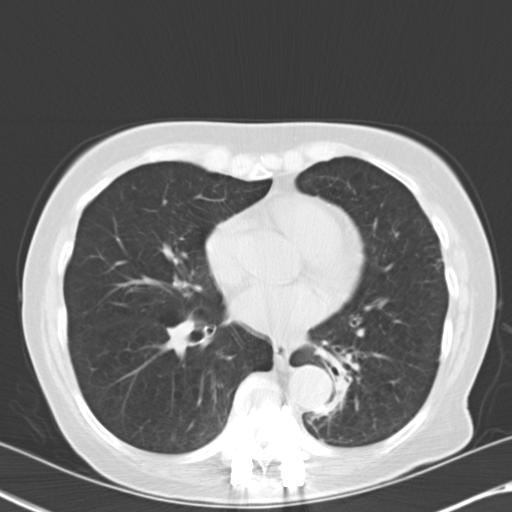
[im 28/63  lung]
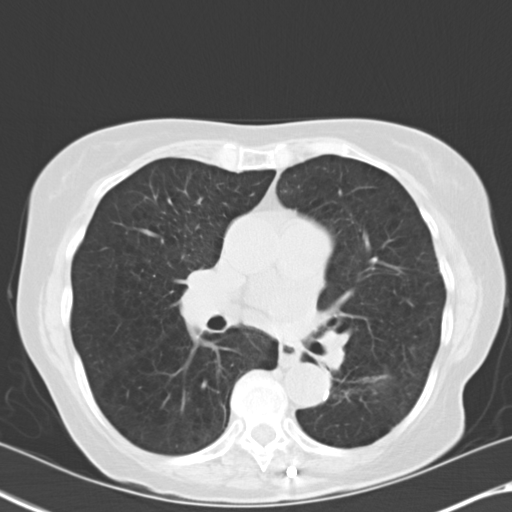
[im 35/63  lung]
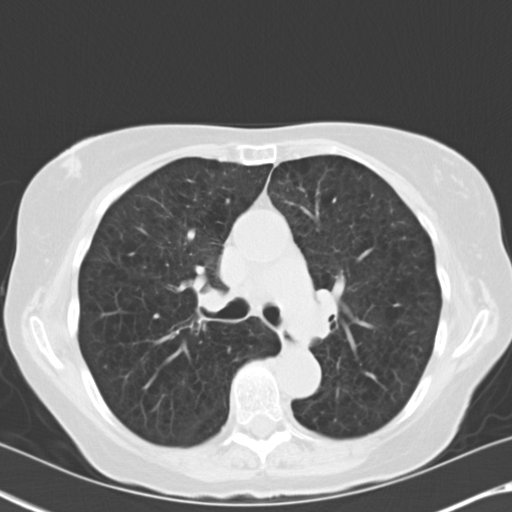
[im 40/63  lung]
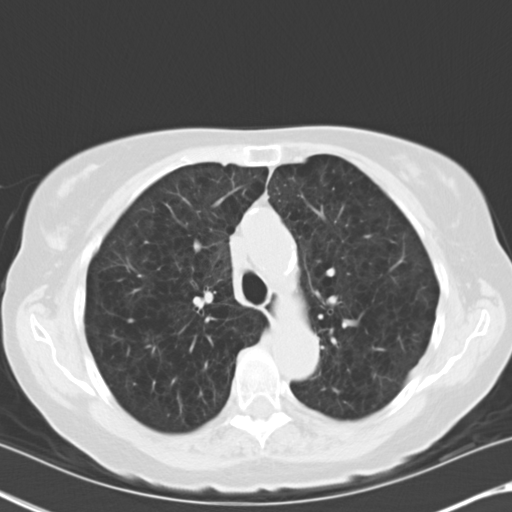
[im 44/63  mediastinal]
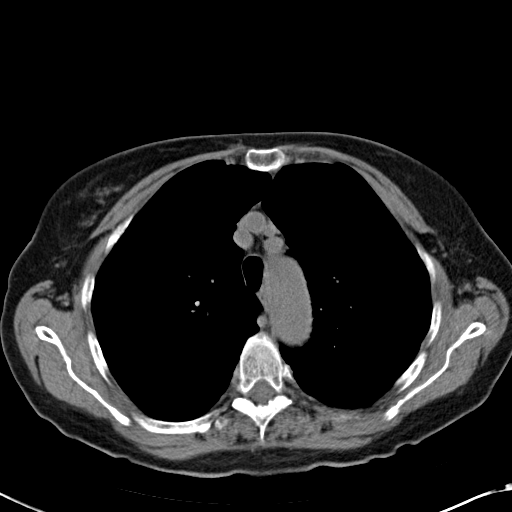
[im 44/63  lung]
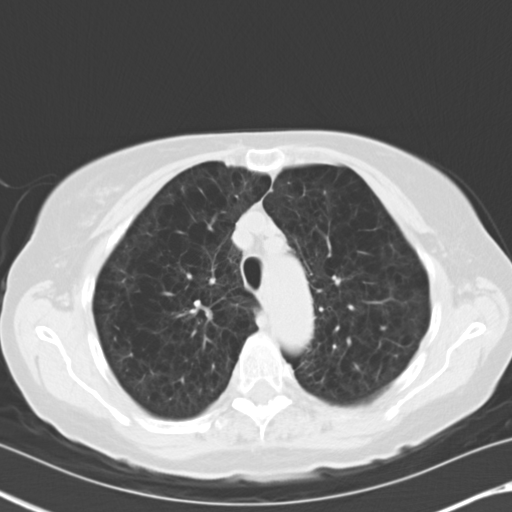
[im 49/63  lung]
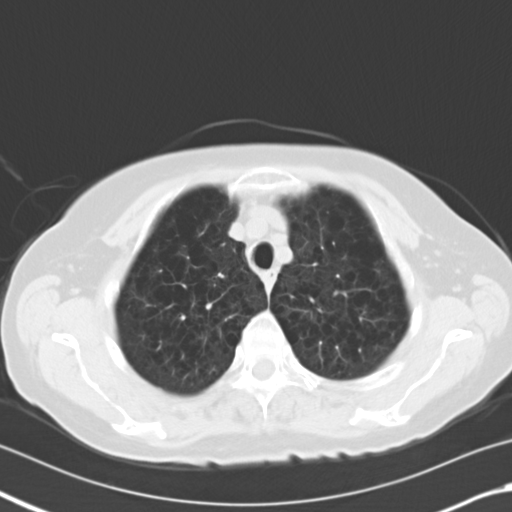
[im 53/63  lung]
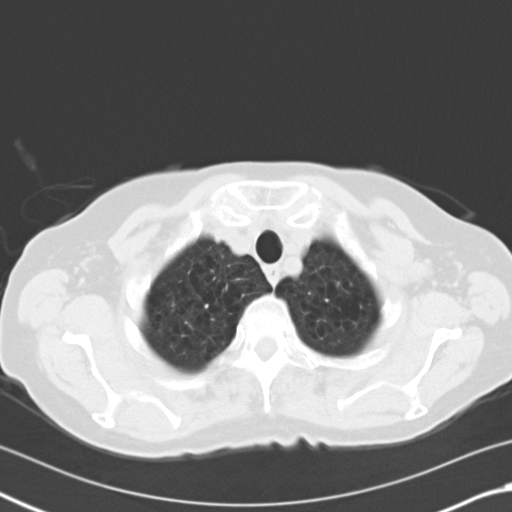
[im 58/63  lung]
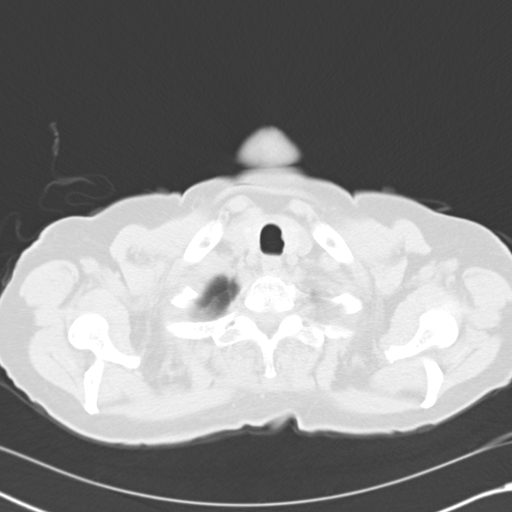

[Series 4: mpr coro 3mm · coronal · 0.61mm/px · 3 of 80 slices shown]
[im 16/80  lung]
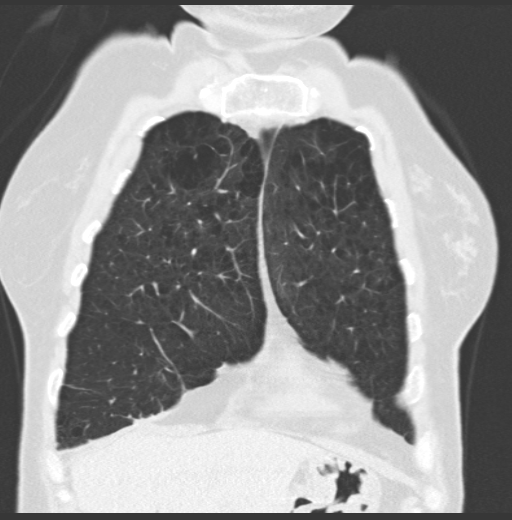
[im 32/80  lung]
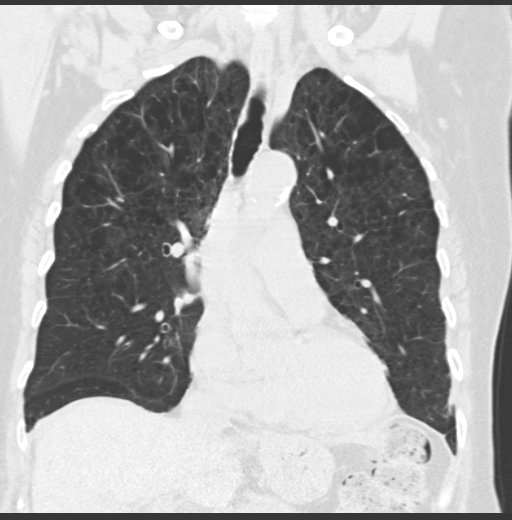
[im 48/80  lung]
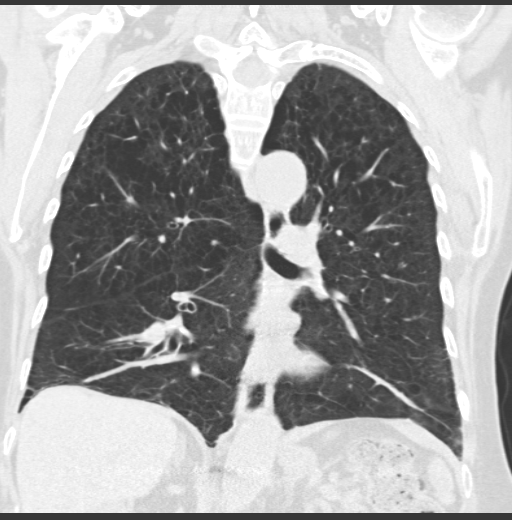

[15 of 36 positions shown; findings below may reference images not displayed]

FINDINGS: Mediastinum/Nodes: Normal heart size. Mild pericardial fluid/
thickening, decreased. Atherosclerotic nonaneurysmal thoracic aorta.
Normal caliber pulmonary arteries. Normal visualized thyroid. Normal
esophagus. No axillary, mediastinal or gross hilar lymphadenopathy,
noting limited sensitivity for the detection of hilar adenopathy on
this noncontrast study.

Lungs/Pleura: No pneumothorax. No pleural effusion. Stable moderate
to severe upper lobe predominant centrilobular emphysema. Irregular
0.5 cm pulmonary nodule in the superior segment right lower lobe
(series 3/image 32), stable since at least 10/31/2012 chest CT.
Stable subsegmental scarring versus atelectasis in the basilar right
lower lobe. Stable scarring versus atelectasis in the lingula. The
patient is status post wedge resection in the superior left lower
lobe. There is curvilinear scarring in the medial and basilar left
lower lobe, which is a less nodular in the interval, in keeping with
benign scarring. No new significant pulmonary nodules or lung
masses. No new focal lung consolidation.

Upper abdomen: Unremarkable.

Musculoskeletal: Stable moderate compression fracture of the T8
vertebral body, with approximately 50% loss of anterior vertebral
body height. Stable severe T12 vertebral body compression fracture
with approximately 90% loss of vertebral body height. Partially
visualized bilateral posterior spinal fusion from the T10 level into
the upper lumbar spine. No suspicious focal osseous lesions. Mild
degenerative changes in the lower thoracic spine.
IMPRESSION: 1. Status post left lower lobe wedge resection. Benign scarring in
the medial/basilar left lower lobe. No evidence of local tumor
recurrence or metastatic disease in the chest.
2. Irregular 0.5 cm superior segment right lower lobe pulmonary
nodule, stable since at least 10/31/2012.
3. Moderate to severe centrilobular emphysema.

## 2017-03-20 DIAGNOSIS — Z961 Presence of intraocular lens: Secondary | ICD-10-CM | POA: Diagnosis not present

## 2017-03-26 ENCOUNTER — Other Ambulatory Visit (HOSPITAL_COMMUNITY): Payer: PPO

## 2017-03-26 ENCOUNTER — Ambulatory Visit: Payer: PPO | Admitting: Vascular Surgery

## 2017-03-26 ENCOUNTER — Encounter (HOSPITAL_COMMUNITY): Payer: PPO

## 2017-04-03 IMAGING — DX DG CHEST 2V
2 series · 2 of 2 positions shown · non-contrast
Comparison: 05/09/2015 and 03/14/2015

CLINICAL DATA: Near syncope.

EXAM:
CHEST  2 VIEW

[chest pa]
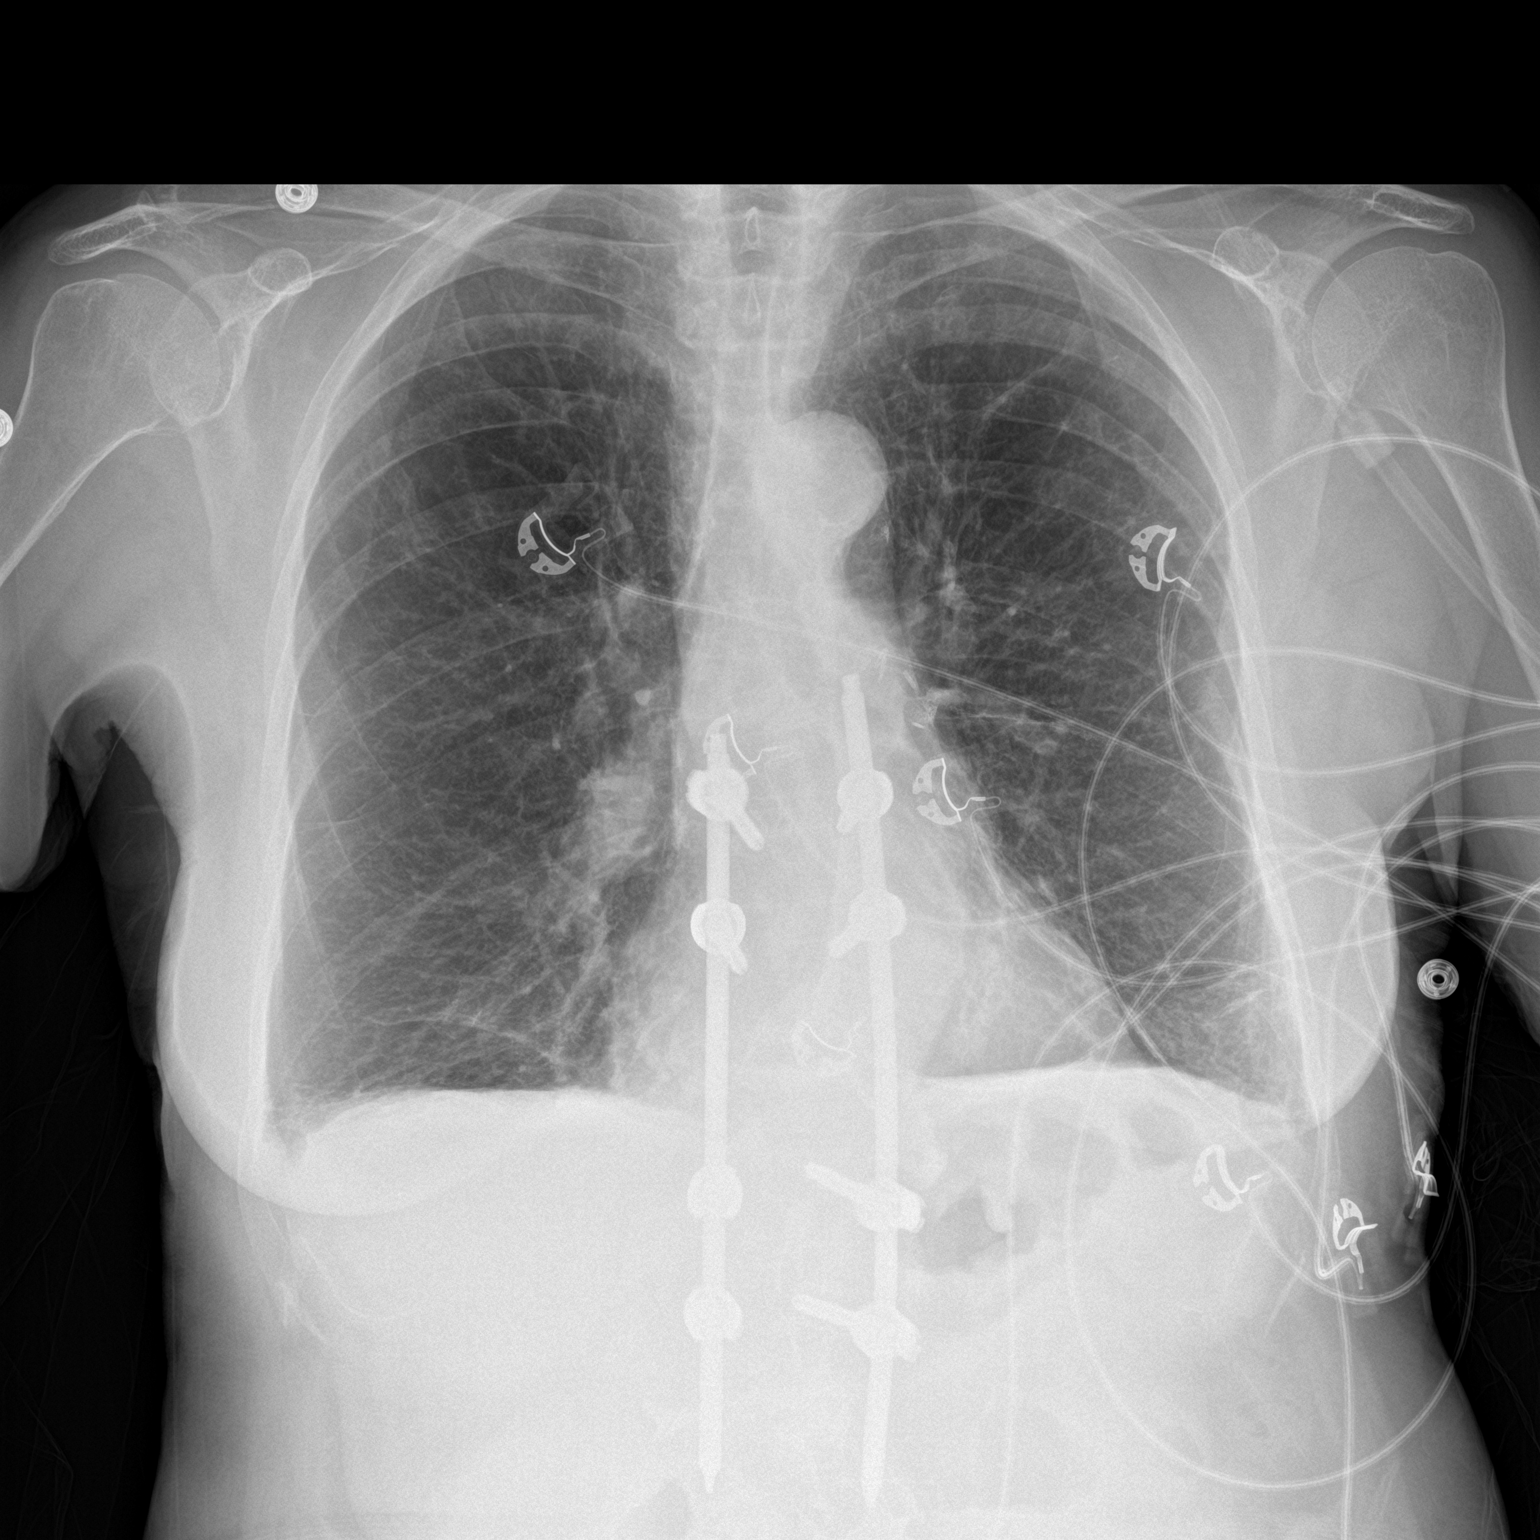

[chest lat]
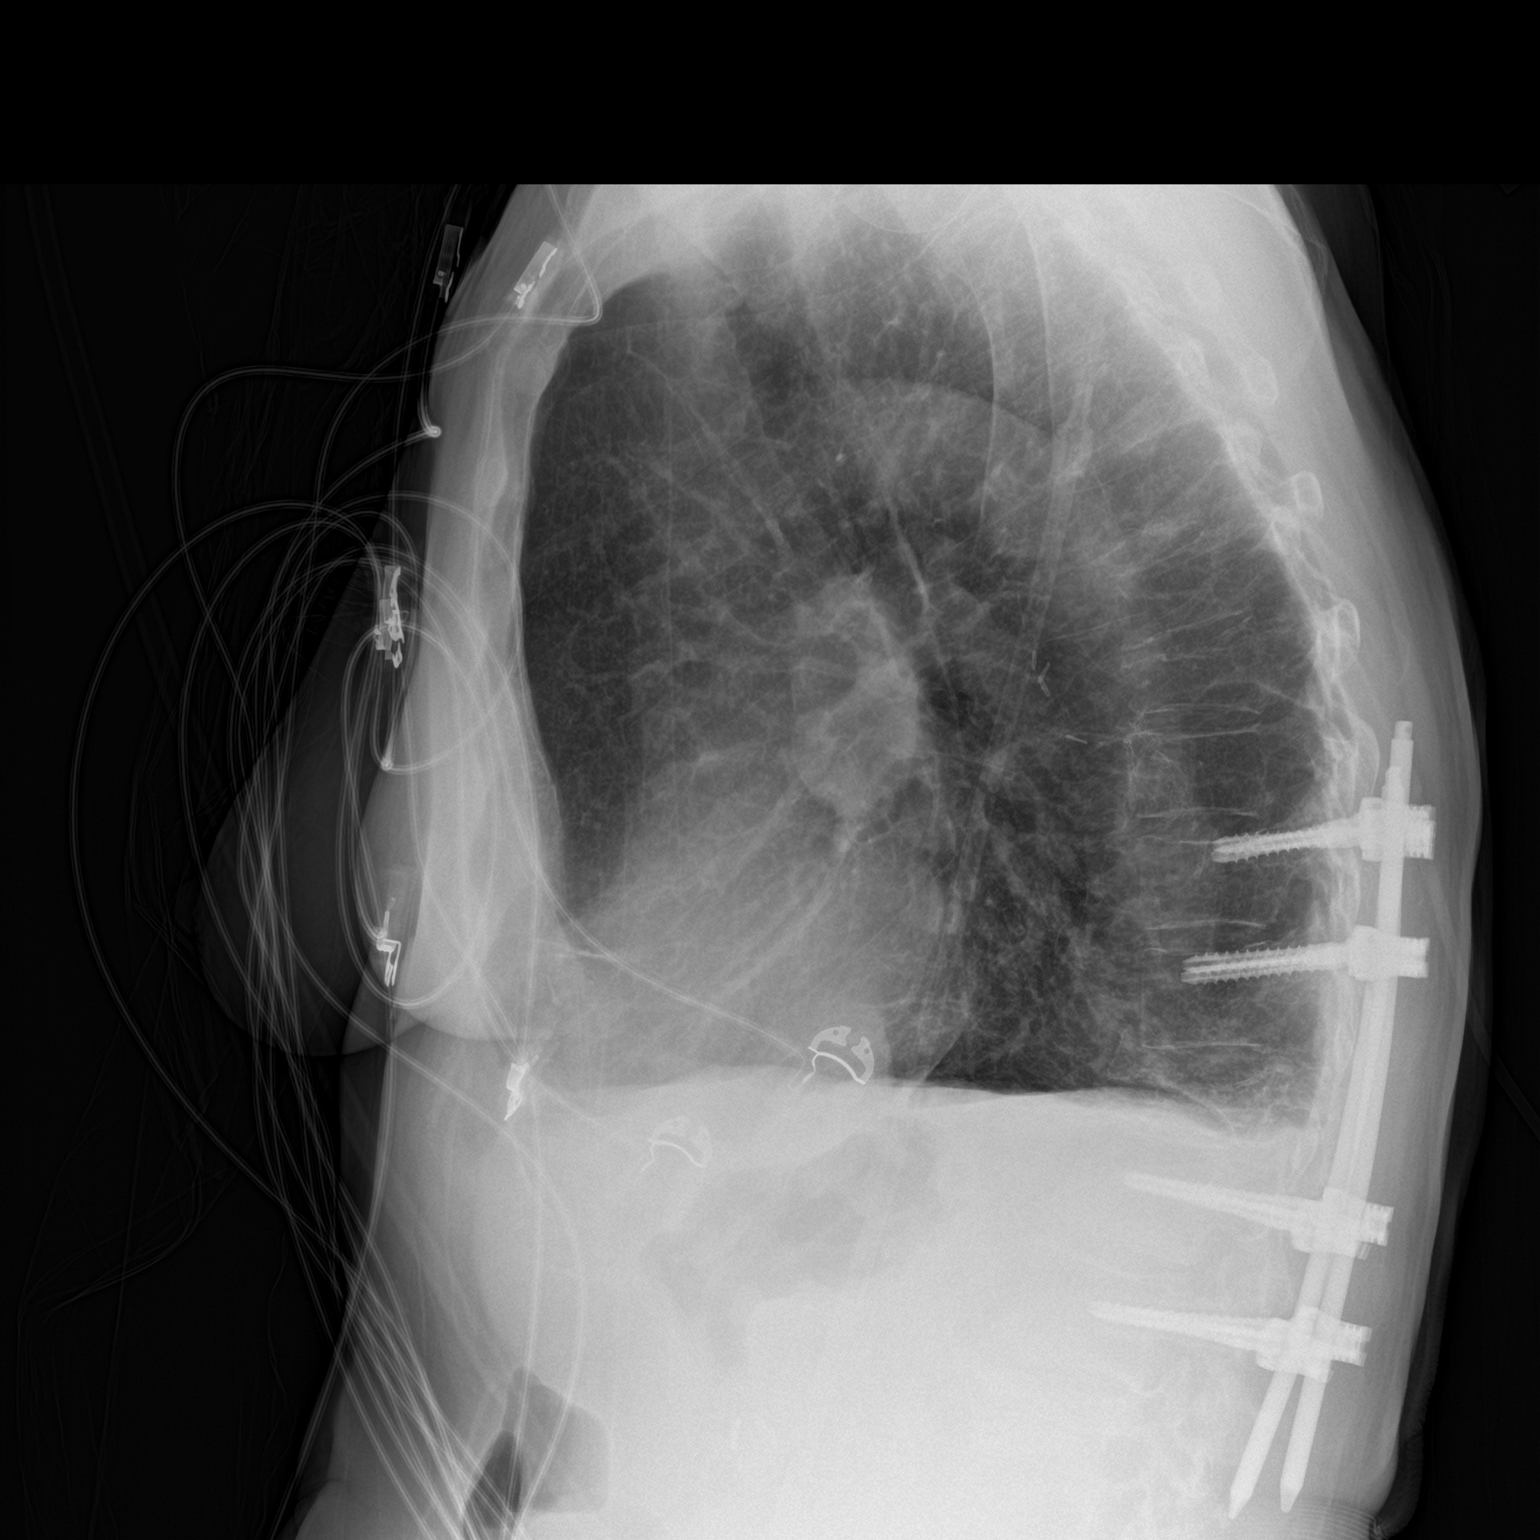

[2 of 2 positions shown; findings below may reference images not displayed]

FINDINGS: Surgical rods in the thoracolumbar spine. Lucency in the upper lungs
compatible with emphysema. Chronic linear and reticular densities at
the left costophrenic angle. Negative for acute airspace disease or
pulmonary edema. Heart size is normal. Stable vertebral body
compression fractures.
IMPRESSION: No active cardiopulmonary disease.

Emphysematous changes.

## 2017-04-04 DIAGNOSIS — R0602 Shortness of breath: Secondary | ICD-10-CM | POA: Diagnosis not present

## 2017-04-04 DIAGNOSIS — J9621 Acute and chronic respiratory failure with hypoxia: Secondary | ICD-10-CM | POA: Diagnosis not present

## 2017-04-04 DIAGNOSIS — C3490 Malignant neoplasm of unspecified part of unspecified bronchus or lung: Secondary | ICD-10-CM | POA: Diagnosis not present

## 2017-04-04 DIAGNOSIS — R0902 Hypoxemia: Secondary | ICD-10-CM | POA: Diagnosis not present

## 2017-04-09 IMAGING — DX DG CHEST 2V
2 series · 2 of 2 positions shown · non-contrast
Comparison: PA and lateral chest x-ray June 01, 2015

CLINICAL DATA: Cough, bibasilar crackles on chest exam, history of
COPD

EXAM:
CHEST  2 VIEW

[chest pa]
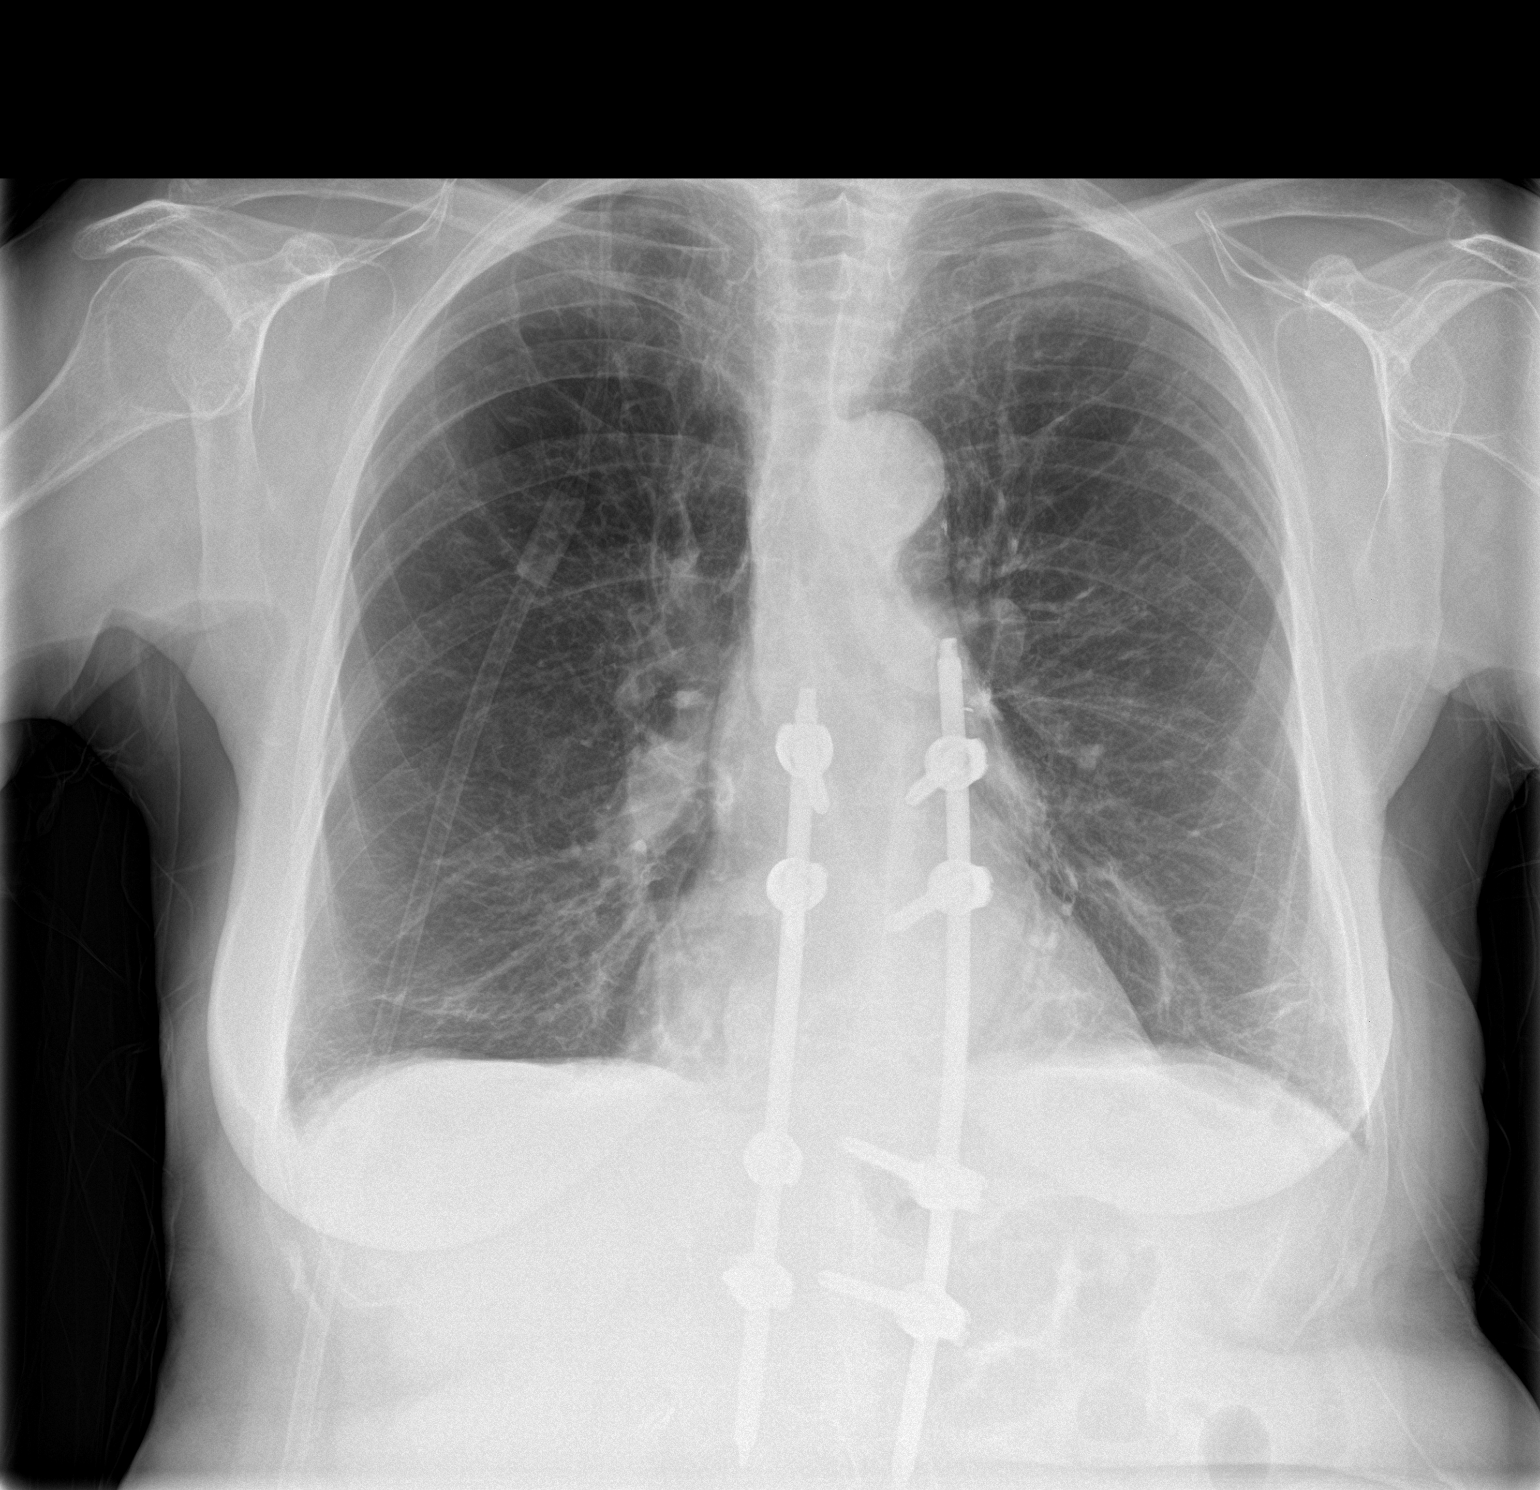

[chest lat]
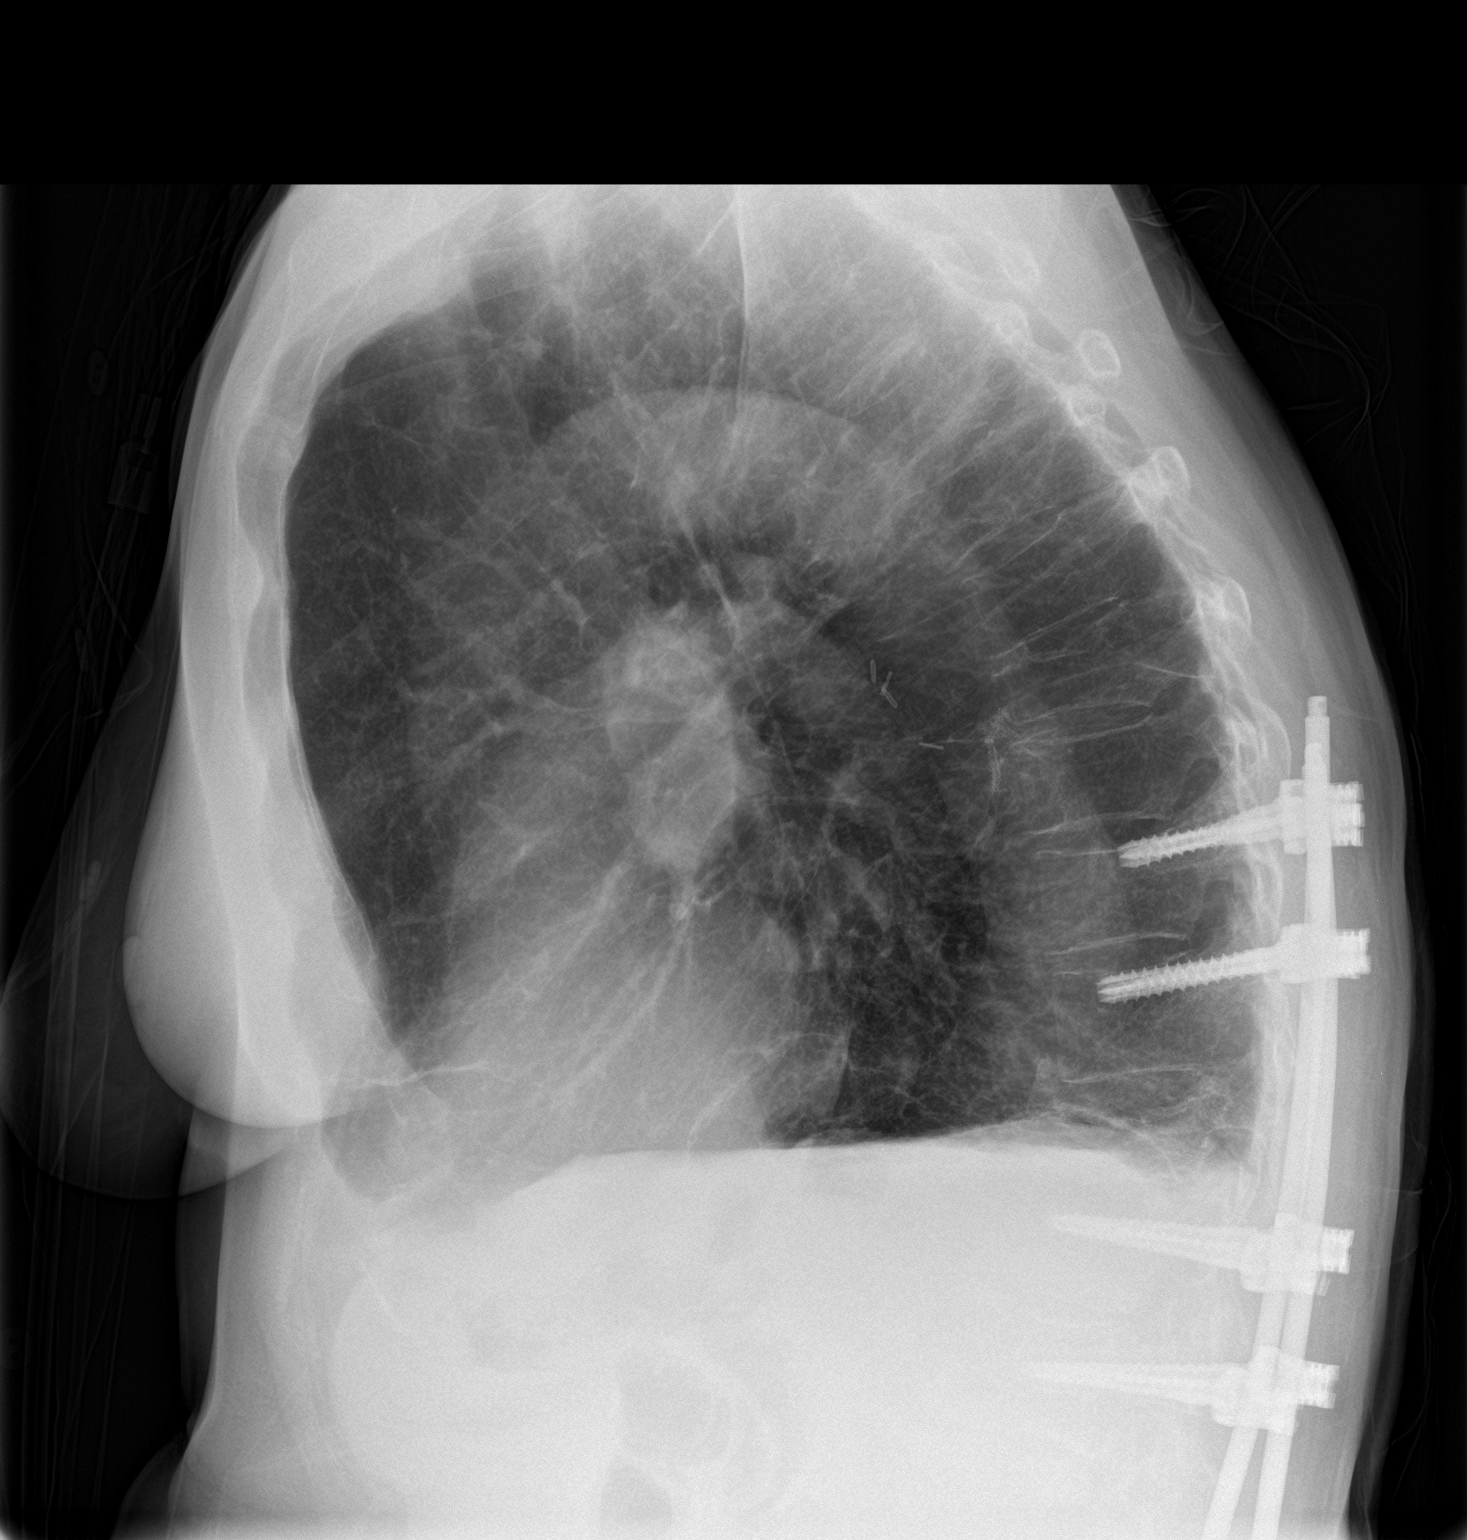

[2 of 2 positions shown; findings below may reference images not displayed]

FINDINGS: The lungs are adequately inflated. The interstitial markings are
chronically increased. There is subsegmental atelectasis versus
scarring at the lung bases. The heart is normal in size. The
pulmonary vascularity is not engorged. The mediastinum is normal in
width. There is no pleural effusion. High-grade T12 compression
fracture is again demonstrated. The patient has undergone posterior
fusion from T10 and T11 into the upper thoracolumbar spine. There is
compression of the body of T8 which is stable.
IMPRESSION: COPD and chronic pulmonary fibrotic change. No acute cardiopulmonary
abnormality is observed.

## 2017-04-22 ENCOUNTER — Telehealth (HOSPITAL_COMMUNITY): Payer: Self-pay | Admitting: *Deleted

## 2017-04-22 NOTE — Telephone Encounter (Signed)
Pt aware that per Kirby Crigler PA-C. That we can move her scan up.

## 2017-04-22 NOTE — Telephone Encounter (Signed)
-----   Message from [facility], PA-C sent at 04/22/2017  3:58 PM EDT ----- Regarding: RE: MOVING CT SCAN UP? Go ahead and move scan up TK ----- Message ----- From: Farley Ly, LPN Sent: 11/07/8655  11:48 AM To: Baird Cancer, PA-C Subject: MOVING CT SCAN UP?                             Tom, pt called stating that she has a CT scheduled for 05/29/17, but that she is concerned and needs to have the CT moved up. Pt states that she is having pain on her right side under her rib cage and has had the pain for about a month. She states that she is concerned because it reminds her of the same pain she had on her left side when she was diagnosed with lung cancer.  I advised the pt that you were out of the clinic and that you would be back Monday and I would speak to about the above then.   Thanks,  Caryl Pina

## 2017-04-25 ENCOUNTER — Other Ambulatory Visit: Payer: Self-pay | Admitting: Family Medicine

## 2017-04-25 MED ORDER — UMECLIDINIUM-VILANTEROL 62.5-25 MCG/INH IN AEPB: 1.0000 | INHALATION_SPRAY | Freq: Every day | RESPIRATORY_TRACT | 3 refills | Status: DC

## 2017-04-30 ENCOUNTER — Encounter: Payer: Self-pay | Admitting: Vascular Surgery

## 2017-05-04 DIAGNOSIS — J9621 Acute and chronic respiratory failure with hypoxia: Secondary | ICD-10-CM | POA: Diagnosis not present

## 2017-05-04 DIAGNOSIS — R0602 Shortness of breath: Secondary | ICD-10-CM | POA: Diagnosis not present

## 2017-05-04 DIAGNOSIS — R0902 Hypoxemia: Secondary | ICD-10-CM | POA: Diagnosis not present

## 2017-05-04 DIAGNOSIS — C3490 Malignant neoplasm of unspecified part of unspecified bronchus or lung: Secondary | ICD-10-CM | POA: Diagnosis not present

## 2017-05-07 ENCOUNTER — Encounter: Payer: Self-pay | Admitting: Family Medicine

## 2017-05-07 ENCOUNTER — Other Ambulatory Visit: Payer: Self-pay

## 2017-05-07 ENCOUNTER — Other Ambulatory Visit: Payer: Self-pay | Admitting: Family Medicine

## 2017-05-07 DIAGNOSIS — I714 Abdominal aortic aneurysm, without rupture, unspecified: Secondary | ICD-10-CM

## 2017-05-07 DIAGNOSIS — R058 Other specified cough: Secondary | ICD-10-CM

## 2017-05-07 DIAGNOSIS — R05 Cough: Secondary | ICD-10-CM

## 2017-05-07 DIAGNOSIS — I720 Aneurysm of carotid artery: Secondary | ICD-10-CM

## 2017-05-09 ENCOUNTER — Ambulatory Visit (HOSPITAL_COMMUNITY): Payer: PPO

## 2017-05-09 ENCOUNTER — Encounter: Payer: Self-pay | Admitting: Family Medicine

## 2017-05-09 ENCOUNTER — Ambulatory Visit (HOSPITAL_COMMUNITY)
Admission: RE | Admit: 2017-05-09 | Discharge: 2017-05-09 | Disposition: A | Discharge status: Home / Self Care | Payer: PPO | Source: Ambulatory Visit | Attending: Oncology | Admitting: Oncology

## 2017-05-09 DIAGNOSIS — J439 Emphysema, unspecified: Secondary | ICD-10-CM | POA: Diagnosis not present

## 2017-05-09 DIAGNOSIS — C3492 Malignant neoplasm of unspecified part of left bronchus or lung: Secondary | ICD-10-CM

## 2017-05-09 DIAGNOSIS — R918 Other nonspecific abnormal finding of lung field: Secondary | ICD-10-CM | POA: Insufficient documentation

## 2017-05-09 DIAGNOSIS — I2510 Atherosclerotic heart disease of native coronary artery without angina pectoris: Secondary | ICD-10-CM | POA: Insufficient documentation

## 2017-05-09 DIAGNOSIS — I700 Atherosclerosis of aorta: Secondary | ICD-10-CM | POA: Diagnosis not present

## 2017-05-09 DIAGNOSIS — R29890 Loss of height: Secondary | ICD-10-CM | POA: Diagnosis not present

## 2017-05-09 DIAGNOSIS — J432 Centrilobular emphysema: Secondary | ICD-10-CM | POA: Diagnosis not present

## 2017-05-09 DIAGNOSIS — J438 Other emphysema: Secondary | ICD-10-CM | POA: Insufficient documentation

## 2017-05-09 DIAGNOSIS — M4854XA Collapsed vertebra, not elsewhere classified, thoracic region, initial encounter for fracture: Secondary | ICD-10-CM | POA: Diagnosis not present

## 2017-05-13 ENCOUNTER — Encounter (HOSPITAL_COMMUNITY): Payer: PPO | Attending: Oncology | Admitting: Oncology

## 2017-05-13 ENCOUNTER — Other Ambulatory Visit (HOSPITAL_COMMUNITY)
Admission: RE | Admit: 2017-05-13 | Discharge: 2017-05-13 | Disposition: A | Discharge status: Home / Self Care | Payer: PPO | Source: Ambulatory Visit | Attending: Family Medicine | Admitting: Family Medicine

## 2017-05-13 ENCOUNTER — Encounter (HOSPITAL_COMMUNITY): Payer: PPO

## 2017-05-13 VITALS — BP 109/59 | HR 87 | Resp 20 | Ht 65.0 in | Wt 131.0 lb

## 2017-05-13 DIAGNOSIS — Z85118 Personal history of other malignant neoplasm of bronchus and lung: Secondary | ICD-10-CM | POA: Diagnosis not present

## 2017-05-13 DIAGNOSIS — R05 Cough: Secondary | ICD-10-CM

## 2017-05-13 DIAGNOSIS — D508 Other iron deficiency anemias: Secondary | ICD-10-CM

## 2017-05-13 DIAGNOSIS — C3490 Malignant neoplasm of unspecified part of unspecified bronchus or lung: Secondary | ICD-10-CM

## 2017-05-13 DIAGNOSIS — D509 Iron deficiency anemia, unspecified: Secondary | ICD-10-CM | POA: Diagnosis not present

## 2017-05-13 DIAGNOSIS — R058 Other specified cough: Secondary | ICD-10-CM

## 2017-05-13 LAB — CBC WITH DIFFERENTIAL/PLATELET
Basophils Absolute: 0 10*3/uL (ref 0.0–0.1)
Basophils Relative: 1 %
Eosinophils Absolute: 0.2 10*3/uL (ref 0.0–0.7)
Eosinophils Relative: 3 %
HCT: 40.4 % (ref 36.0–46.0)
Hemoglobin: 13.8 g/dL (ref 12.0–15.0)
Lymphocytes Relative: 18 %
Lymphs Abs: 1.2 10*3/uL (ref 0.7–4.0)
MCH: 30.7 pg (ref 26.0–34.0)
MCHC: 34.2 g/dL (ref 30.0–36.0)
MCV: 90 fL (ref 78.0–100.0)
Monocytes Absolute: 0.5 10*3/uL (ref 0.1–1.0)
Monocytes Relative: 8 %
Neutro Abs: 4.7 10*3/uL (ref 1.7–7.7)
Neutrophils Relative %: 70 %
Platelets: 270 10*3/uL (ref 150–400)
RBC: 4.49 MIL/uL (ref 3.87–5.11)
RDW: 13 % (ref 11.5–15.5)
WBC: 6.6 10*3/uL (ref 4.0–10.5)

## 2017-05-13 LAB — FERRITIN: Ferritin: 31 ng/mL (ref 11–307)

## 2017-05-13 LAB — COMPREHENSIVE METABOLIC PANEL
ALT: 14 U/L (ref 14–54)
AST: 19 U/L (ref 15–41)
Albumin: 4.4 g/dL (ref 3.5–5.0)
Alkaline Phosphatase: 42 U/L (ref 38–126)
Anion gap: 9 (ref 5–15)
BUN: 11 mg/dL (ref 6–20)
CO2: 28 mmol/L (ref 22–32)
Calcium: 9.8 mg/dL (ref 8.9–10.3)
Chloride: 99 mmol/L — ABNORMAL LOW (ref 101–111)
Creatinine, Ser: 0.49 mg/dL (ref 0.44–1.00)
GFR calc Af Amer: >60 mL/min (ref >60)
GFR calc non Af Amer: >60 mL/min (ref >60)
Glucose, Bld: 107 mg/dL — ABNORMAL HIGH (ref 65–99)
Potassium: 4 mmol/L (ref 3.5–5.1)
Sodium: 136 mmol/L (ref 135–145)
Total Bilirubin: 0.6 mg/dL (ref 0.3–1.2)
Total Protein: 7.5 g/dL (ref 6.5–8.1)

## 2017-05-13 LAB — IRON AND TIBC
Iron: 98 ug/dL (ref 28–170)
Saturation Ratios: 26 % (ref 10.4–31.8)
TIBC: 378 ug/dL (ref 250–450)
UIBC: 280 ug/dL

## 2017-05-13 LAB — TSH: TSH: 1.763 u[IU]/mL (ref 0.350–4.500)

## 2017-05-13 NOTE — Progress Notes (Signed)
[name], MD [address] [place] [address]  Adenocarcinoma of lung, unspecified laterality [facility])  Other iron deficiency anemia - Plan: CBC with Differential, Comprehensive metabolic panel, Iron and TIBC, Ferritin, TSH  CURRENT THERAPY: Surveillance per NCCN guidelines  INTERVAL HISTORY: [name] 76 y.o. female returns for followup of Stage IB bronchoalveolar carcinoma,. Well-differentiated the left lower lobe status post resection on 12/21/2010. She had 14 lymph nodes negative and no perineural space invasion with clear margins and no LV I. Thus far she has no evidence for recurrent disease or new disease.      Adenocarcinoma of lung ([place])   12/21/2010 Initial Diagnosis    S/P left lower lobe resection for a well-differentiated Stage IB bronchoalveolar cancer. 0/14 lymph nodes.  No perineural invasion or LVI.  negative margins.      06/05/2016 Imaging    Although the previously described ground-glass attenuation nodule in the superior segment of the right lower lobe measures slightly larger on today's examination than prior studies, this is favored to be technique related. Today's study should serve as a baseline for future followup examinations. At this time, there is no central solid component to this lesion. No new nodules are noted. 2. Stable postoperative scarring in the left lower lobe and post infectious scarring throughout the visualize lung bases, as above. 3. Areas of cylindrical bronchiectasis throughout the lung bases bilaterally       05/09/2017 Imaging    IMPRESSION: 1. Status post left lower lobe wedge resection. No findings to suggest local recurrence of disease or definite metastatic disease in the thorax. Previously noted ground-glass attenuation nodule in the superior segment of the right lower lobe appears slightly smaller than the prior study, suggesting a benign etiology. 2. Diffuse bronchial wall thickening with  moderate to severe centrilobular and paraseptal emphysema; imaging findings suggestive of underlying COPD. 3. Aortic atherosclerosis, in addition to left main and left circumflex coronary artery disease. Assessment for potential risk factor modification, dietary therapy or pharmacologic therapy may be warranted, if clinically indicated. 4. Increasing anterior pericardial fluid and/or thickening. This is unlikely to be of hemodynamic significance at this time. No associated pericardial calcification. 5. New (but non acute) compression fracture of T6 with approximately 90% loss of anterior vertebral body height, in addition to multiple other chronic compression fractures of the visualize the thoracolumbar spine.       Today for continued follow-up. She states that she's been having back pain that radiates to her ribs on the right side. She has chronic shortness of breath and is oxygen dependent. She continues take her ferrous sulfate daily however she complains of constipation secondary to iron. She denies any chest pain, abdominal pain, focal weakness.  Review of Systems  Constitutional: Negative.  Negative for chills, fever and weight loss.  HENT: Negative.   Eyes: Negative.   Respiratory: Negative.  Negative for cough and hemoptysis.   Cardiovascular: Negative.  Negative for chest pain.  Gastrointestinal: Negative.  Negative for constipation, diarrhea, nausea and vomiting.  Genitourinary: Negative.   Musculoskeletal: Positive for back pain.  Skin: Negative.   Neurological: Negative.  Negative for weakness.  Endo/Heme/Allergies: Negative.   Psychiatric/Behavioral: Negative.     Past Medical History:  Diagnosis Date  . Adenocarcinoma of lung ([place]) 12/2010   left lung/surg only  . Allergic rhinitis   . 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)    corrected by surgery 08/21/13  . Anxiety disorder   .  Aortic aneurysm ([place])   . Complication of anesthesia 2011   bloodpressure dropped  during colonoscopy, not problems since  . Compression fracture   . COPD (chronic obstructive pulmonary disease) ([place])   . Depression   . Diastolic dysfunction   . Diverticulosis   . Dysphagia   . Emphysema lung ([place]) 11/08/2014  . Hiatal hernia   . Hypothyroidism   . IBS (irritable bowel syndrome)   . Iron deficiency anemia due to chronic blood loss 12/05/2016  . On home O2 12/15/2013   chronic hypoxia  . PUD (peptic ulcer disease)    50yrs  . Shortness of breath    with exertion  . SIADH (syndrome of inappropriate ADH production) ([place])   . Trigeminal neuralgia     Past Surgical History:  Procedure Laterality Date  . ABDOMINAL HYSTERECTOMY    . BACK SURGERY  January 13, 2014  . CATARACT EXTRACTION    . CATARACT EXTRACTION W/PHACO  09/02/2012   Procedure: CATARACT EXTRACTION PHACO AND INTRAOCULAR LENS PLACEMENT (IOC);  Surgeon: Elta Guadeloupe T. Gershon Crane, MD;  Location: [facility] ORS;  Service: Ophthalmology;  Laterality: Left;  CDE:10.35  . CHOLECYSTECTOMY    . 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 COLONOSCOPY N/A 11/23/2014   Procedure: COLONOSCOPY;  Surgeon: Danie Binder, MD;  Location: [facility] ENDO SUITE;  Service: Endoscopy;  Laterality: N/A;  930   . ENDARTERECTOMY Right 08/21/2013   Procedure: RIGHT CAROTID ANEURYSM RESECTION;  Surgeon: Rosetta Posner, MD;  Location: [facility] OR;  Service: Vascular;  Laterality: Right;  . ESOPHAGOGASTRODUODENOSCOPY  11/13/2009   w/dilation to 5mm, gastric ulceration (H.Pylori) s/p treatment  . ESOPHAGOGASTRODUODENOSCOPY  11/21/2009   distal esophageal web, gastritis  . ESOPHAGOGASTRODUODENOSCOPY  03/14/12   SEG:[id number] in the distal esophagus/Mild gastritis/small HH. + H.pylori, prescribed Pylera. Finished treatment.   . fatty tumor removal from lt groin    . FRACTURE SURGERY Left    hand and left ring finger  . LUNG CANCER SURGERY  12/2010   Left VATS, minithoracotomy, LLL superior segmentectomy  . ORIF WRIST FRACTURE Left  04/09/2014   Procedure: OPEN REDUCTION INTERNAL FIXATION (ORIF) WRIST FRACTURE;  Surgeon: Renette Butters, MD;  Location: [place];  Service: Orthopedics;  Laterality: Left;  . PERCUTANEOUS PINNING Left 04/09/2014   Procedure: PERCUTANEOUS PINNING EXTREMITY;  Surgeon: Renette Butters, MD;  Location: [place];  Service: Orthopedics;  Laterality: Left;  . TUBAL LIGATION    . vocal cord surgery  02/06/2011   laryngoscopy with bilateral vocal cord Radiesse injection for vocal cord paralysis  . YAG LASER APPLICATION Left 73/71/0626   Procedure: YAG LASER APPLICATION;  Surgeon: Rutherford Guys, MD;  Location: [facility] ORS;  Service: Ophthalmology;  Laterality: Left;    Family History  Problem Relation Age of Onset  . Pulmonary embolism Mother   . Heart attack Father   . Hypertension Father   . Liver cancer Sister   . Cancer Sister   . Cancer Brother   . Cancer Daughter   . Colon cancer Neg Hx     Social History   Social History  . Marital status: Divorced    Spouse name: N/A  . Number of children: N/A  . Years of education: N/A   Social History Main Topics  . Smoking status: Former Smoker    Packs/day: 0.50    Years: 20.00    Types: Cigarettes    Quit date: 12/21/2010  . Smokeless tobacco: Never Used  Comment: smoking cessation info given and reviewed   . Alcohol use 0.0 oz/week     Comment: seldom  . Drug use: No  . Sexual activity: Yes    Birth control/ protection: Surgical   Other Topics Concern  . Not on file   Social History Narrative  . No narrative on file     PHYSICAL EXAMINATION  ECOG PERFORMANCE STATUS: 1 - Symptomatic but completely ambulatory  Vitals:   05/13/17 1401  BP: (!) 109/59  Pulse: 87  Resp: 20    Constitutional: Well-developed, well-nourished, and in no distress.  On nasal cannula oxygen. HENT:  Head: Normocephalic and atraumatic.  Mouth/Throat: No oropharyngeal exudate. Mucosa moist. Eyes: Pupils are equal, round, and reactive to light. Conjunctivae  are normal. No scleral icterus.  Neck: Normal range of motion. Neck supple. No JVD present.  Cardiovascular: Normal rate, regular rhythm and normal heart sounds.  Exam reveals no gallop and no friction rub.   No murmur heard. Pulmonary/Chest: Effort normal and breath sounds normal. No respiratory distress. No wheezes.No rales.  Abdominal: Soft. Bowel sounds are normal. No distension. There is no tenderness. There is no guarding.  Musculoskeletal: No edema or tenderness. Mild kyphosis.  Lymphadenopathy:    No cervical or supraclavicular adenopathy.  Neurological: Alert and oriented to person, place, and time. No cranial nerve deficit.  Skin: Skin is warm and dry. No rash noted. No erythema. No pallor.  Psychiatric: Affect and judgment normal.    LABORATORY DATA: CBC    Component Value Date/Time   WBC 6.5 12/05/2016 0955   RBC 4.55 12/05/2016 0955   HGB 11.7 (L) 12/05/2016 0955   HCT 35.9 (L) 12/05/2016 0955   PLT 417 (H) 12/05/2016 0955   MCV 78.9 12/05/2016 0955   MCH 25.7 (L) 12/05/2016 0955   MCHC 32.6 12/05/2016 0955   RDW 18.8 (H) 12/05/2016 0955   LYMPHSABS 1.4 12/05/2016 0955   MONOABS 0.5 12/05/2016 0955   EOSABS 0.1 12/05/2016 0955   BASOSABS 0.0 12/05/2016 0955      Chemistry      Component Value Date/Time   NA 132 (L) 11/01/2016 1056   K 4.6 11/01/2016 1056   CL 96 (L) 11/01/2016 1056   CO2 25 11/01/2016 1056   BUN 9 11/01/2016 1056   CREATININE 0.40 (L) 11/01/2016 1056      Component Value Date/Time   CALCIUM 9.7 11/01/2016 1056   ALKPHOS 78 11/01/2016 1056   AST 15 11/01/2016 1056   ALT 11 11/01/2016 1056   BILITOT 0.3 11/01/2016 1056     Lab Results  Component Value Date   IRON 45 12/05/2016   TIBC 442 12/05/2016   FERRITIN 59 12/05/2016     PENDING LABS:   RADIOGRAPHIC STUDIES:  Ct Chest Wo Contrast  Result Date: 05/09/2017 CLINICAL DATA:  76 year old female with new right-sided posterior chest pain for the past 2 weeks. History of stage  IB adenocarcinoma of the lung status post wedge resection in March 2012. Followup study. EXAM: CT CHEST WITHOUT CONTRAST TECHNIQUE: Multidetector CT imaging of the chest was performed following the standard protocol without IV contrast. COMPARISON:  Chest CT 05/27/2016. FINDINGS: Cardiovascular: Heart size is normal. Focal area of pericardial fluid and/or thickening anterior to the right ventricle, increased compared to the prior study, now measuring up to 13 mm in thickness. No associated pericardial calcification. There is aortic atherosclerosis, as well as atherosclerosis of the great vessels of the mediastinum and the coronary arteries, including calcified atherosclerotic plaque  in the left main and left circumflex coronary arteries. Calcifications of the mitral annulus. Mediastinum/Nodes: No pathologically enlarged mediastinal or hilar lymph nodes. Please note that accurate exclusion of hilar adenopathy is limited on noncontrast CT scans. Esophagus is unremarkable in appearance. No axillary lymphadenopathy. Lungs/Pleura: Postoperative changes of wedge resection are again noted in the left lower lobe. Previously noted ground-glass attenuation nodule in the superior segment of the right lower lobe has decreased in size compared to the prior examination, currently measuring 6 x 3 mm (axial image 56 of series 4) previously 8 x 6 mm on 05/27/2016. No other new suspicious appearing pulmonary nodules or masses. Diffuse bronchial wall thickening with moderate to severe centrilobular and paraseptal emphysema. No acute consolidative airspace disease. No pleural effusions. Linear scarring in the right middle lobe, inferior segment of the lingula and medial aspect of the left lower lobe. Upper Abdomen: 5 mm nonobstructive calculus in the interpolar collecting system of the right kidney. Aortic atherosclerosis. Musculoskeletal: Orthopedic fixation hardware in the thoracolumbar spine again noted an incompletely imaged.  Multiple compression fractures at T6, T8, T12 and L1, which appear unchanged compared to the prior study, with exception of T6 which is new and associated with 90% loss of anterior vertebral body height. Although this is new compared to the prior CT scan, this was evident in retrospect on prior chest radiographs, and is not an acute finding. There are no aggressive appearing lytic or blastic lesions noted in the visualized portions of the skeleton. IMPRESSION: 1. Status post left lower lobe wedge resection. No findings to suggest local recurrence of disease or definite metastatic disease in the thorax. Previously noted ground-glass attenuation nodule in the superior segment of the right lower lobe appears slightly smaller than the prior study, suggesting a benign etiology. 2. Diffuse bronchial wall thickening with moderate to severe centrilobular and paraseptal emphysema; imaging findings suggestive of underlying COPD. 3. Aortic atherosclerosis, in addition to left main and left circumflex coronary artery disease. Assessment for potential risk factor modification, dietary therapy or pharmacologic therapy may be warranted, if clinically indicated. 4. Increasing anterior pericardial fluid and/or thickening. This is unlikely to be of hemodynamic significance at this time. No associated pericardial calcification. 5. New (but non acute) compression fracture of T6 with approximately 90% loss of anterior vertebral body height, in addition to multiple other chronic compression fractures of the visualize the thoracolumbar spine. Aortic Atherosclerosis (ICD10-I70.0) and Emphysema (ICD10-J43.9). Electronically Signed   By: Vinnie Langton M.D.   On: 05/09/2017 14:10     PATHOLOGY:    ASSESSMENT AND PLAN:  1. Stage IB bronchoalveolar carcinoma,. Well-differentiated the left lower lobe status post resection on 12/21/2010.  -Reviewed patient's CT chest in detail with her today. She has no evidence of recurrent  disease.  2. Iron deficiency anemia -No labs were performed today therefore I will order labs to be done. -Continue oral ferrous sulfate for now. However if she is found to have low ferritin below 100, I have discussed with patient placing her on IV Feraheme since she does have severe constipation with oral iron, and she is agreeable.  Return to clinic in 6 months for follow-up. Labs on her next visit as well.  ORDERS PLACED FOR THIS ENCOUNTER: Orders Placed This Encounter  Procedures  . CBC with Differential  . Comprehensive metabolic panel  . Iron and TIBC  . Ferritin  . TSH    THERAPY PLAN:  NCCN guidelines for Non-Small Cell Lung Cancer Surveillance in the setting of  clinical/radiographic remission are as follows (5.2017):  A. Stage I-II (primary treatment included surgery +/- chemotherapy):   1. H+P and chest CT +/- contrast every 6 months for 2-3 years, then H+P and low-dose non-contrast-enhanced chest CT annually  B. Stage I-II (primary treatment included RT) or Stage III or Stage IV (oligometastatic with all sites treated with definitive intent)   1. H+P and chest CT +/- contrast every 3-6 months for 3 years, then H+P and chest CT +/- contrast every 6 months for 2 years, then H+P and low-dose non-contrast-enhanced chest CT annually    A. Residual or new radiographic abnormalities may require more frequent imaging  C. Smoking cessation advice, counseling, and pharmacotherapy  D. PET/CT or Brain MRI is not routinely indicated.   All questions were answered. The patient knows to call the clinic with any problems, questions or concerns. We can certainly see the patient much sooner if necessary.    This note is electronically signed by: Twana First, MD 05/13/2017 2:20 PM

## 2017-05-14 NOTE — Progress Notes (Signed)
Please call the patient and let her know that her hemoglobin is normal and she does not need extra iron. Tell her that her TSH is normal.

## 2017-05-15 ENCOUNTER — Ambulatory Visit (HOSPITAL_COMMUNITY)
Admission: RE | Admit: 2017-05-15 | Discharge: 2017-05-15 | Disposition: A | Discharge status: Home / Self Care | Payer: PPO | Source: Ambulatory Visit | Attending: Vascular Surgery | Admitting: Vascular Surgery

## 2017-05-15 ENCOUNTER — Ambulatory Visit (INDEPENDENT_AMBULATORY_CARE_PROVIDER_SITE_OTHER): Payer: PPO | Admitting: Vascular Surgery

## 2017-05-15 ENCOUNTER — Encounter: Payer: Self-pay | Admitting: Vascular Surgery

## 2017-05-15 ENCOUNTER — Encounter (HOSPITAL_COMMUNITY): Payer: Self-pay | Admitting: Oncology

## 2017-05-15 VITALS — BP 104/70 | HR 68 | Temp 97.9°F | Resp 18 | Ht 65.0 in | Wt 131.3 lb

## 2017-05-15 DIAGNOSIS — I714 Abdominal aortic aneurysm, without rupture, unspecified: Secondary | ICD-10-CM

## 2017-05-15 DIAGNOSIS — I6529 Occlusion and stenosis of unspecified carotid artery: Secondary | ICD-10-CM

## 2017-05-15 DIAGNOSIS — I720 Aneurysm of carotid artery: Secondary | ICD-10-CM | POA: Insufficient documentation

## 2017-05-15 DIAGNOSIS — I6522 Occlusion and stenosis of left carotid artery: Secondary | ICD-10-CM | POA: Diagnosis not present

## 2017-05-15 LAB — VAS US CAROTID
LEFT ECA DIAS: 13 cm/s
LEFT VERTEBRAL DIAS: -18 cm/s
Left CCA dist dias: 25 cm/s
Left CCA dist sys: 67 cm/s
Left CCA prox dias: 21 cm/s
Left CCA prox sys: 71 cm/s
Left ICA dist dias: -28 cm/s
Left ICA dist sys: -67 cm/s
Left ICA prox dias: -30 cm/s
Left ICA prox sys: -71 cm/s
RIGHT CCA MID DIAS: 24 cm/s
RIGHT ECA DIAS: -14 cm/s
RIGHT VERTEBRAL DIAS: -16 cm/s
Right CCA prox dias: 21 cm/s
Right CCA prox sys: 90 cm/s
Right cca dist sys: -61 cm/s

## 2017-05-15 NOTE — Progress Notes (Signed)
Vascular and Vein Specialist of [facility]  Patient name: [name] MRN: [id number] DOB: 17-Aug-1941 Sex: female  REASON FOR VISIT: Follow-up small abdominal aortic aneurysm and prior resection of right carotid artery aneurysm  HPI: [name] is a 76 y.o. female here for follow-up. She is walking with a walker. She's had multiple falls and multiple compression fractures of her spine. She does look quite good today. She is on oxygen related to her COPD. She has no symptoms referable to her aorta or carotid or neurologic deficits  Past Medical History:  Diagnosis Date  . Adenocarcinoma of lung ([place]) 12/2010   left lung/surg only  . Allergic rhinitis   . 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 Anxiety disorder   . Aortic aneurysm ([place])   . Complication of anesthesia 2011   bloodpressure dropped during colonoscopy, not problems since  . Compression fracture   . COPD (chronic obstructive pulmonary disease) ([place])   . Depression   . Diastolic dysfunction   . Diverticulosis   . Dysphagia   . Emphysema lung ([place]) 11/08/2014  . Hiatal hernia   . Hypothyroidism   . IBS (irritable bowel syndrome)   . Iron deficiency anemia due to chronic blood loss 12/05/2016  . On home O2 12/15/2013   chronic hypoxia  . PUD (peptic ulcer disease)    29yrs  . Shortness of breath    with exertion  . SIADH (syndrome of inappropriate ADH production) ([place])   . Trigeminal neuralgia     Family History  Problem Relation Age of Onset  . Pulmonary embolism Mother   . Heart attack Father   . Hypertension Father   . Liver cancer Sister   . Cancer Sister   . Cancer Brother   . Cancer Daughter   . Colon cancer Neg Hx     SOCIAL HISTORY: Social History  Substance Use Topics  . Smoking status: Former Smoker    Packs/day: 0.50    Years: 20.00    Types: Cigarettes    Quit date: 12/21/2010  . Smokeless tobacco: Never Used   Comment: smoking cessation info given and reviewed   . Alcohol use 0.0 oz/week     Comment: seldom    Allergies  Allergen Reactions  . Ciprofloxacin Hcl Hives  . Iohexol      Desc: Per [facility], pt is allergic to IV contrast, no type of reaction was available. Pt cannot remember but first reacted around 15 yrs ago from an IVP. Notes were date from 2009 at urology center., Onset Date: [id number]   . Prozac [Fluoxetine Hcl] Rash  . Sulfonamide Derivatives Hives and Rash    Current Outpatient Prescriptions  Medication Sig Dispense Refill  . acetaminophen (TYLENOL) 650 MG CR tablet Take 1 tablet (650 mg total) by mouth every 8 (eight) hours as needed for pain. (Patient taking differently: Take 500-650 mg by mouth every 8 (eight) hours as needed for pain. ) 30 tablet 0  . albuterol (PROVENTIL) (2.5 MG/3ML) 0.083% nebulizer solution Take 3 mLs (2.5 mg total) by nebulization every 6 (six) hours as needed for wheezing or shortness of breath. 75 mL 12  . ALPRAZolam (XANAX) 0.5 MG tablet Take 1 tablet (0.5 mg total) by mouth 2 (two) times daily as needed for anxiety or sleep. 60 tablet 2  . calcium citrate-vitamin D (CITRACAL+D) 315-200 MG-UNIT per tablet Take 1 tablet by mouth daily.     . citalopram (  CELEXA) 40 MG tablet Take 0.5 tablets (20 mg total) by mouth at bedtime. 90 tablet 4  . levothyroxine (SYNTHROID, LEVOTHROID) 50 MCG tablet Take 1 tablet (50 mcg total) by mouth daily before breakfast. 90 tablet 3  . Multiple Vitamin (MULTIVITAMIN WITH MINERALS) TABS tablet Take 1 tablet by mouth daily.    [name] omeprazole (PRILOSEC) 40 MG capsule Take 1 capsule (40 mg total) by mouth daily. 90 capsule 3  . ondansetron (ZOFRAN) 4 MG tablet Take 1 tablet (4 mg total) by mouth 3 (three) times daily as needed for nausea or vomiting. 90 tablet 0  . umeclidinium-vilanterol (ANORO ELLIPTA) 62.5-25 MCG/INH AEPB Inhale 1 puff into the lungs daily. 90 each 3  . cyclobenzaprine (FLEXERIL) 10 MG tablet Take 1  tablet (10 mg total) by mouth 3 times/day as needed-between meals & bedtime for muscle spasms. (Patient not taking: Reported on 05/13/2017) 180 tablet 1  . Oxcarbazepine (TRILEPTAL) 300 MG tablet Take 300 mg by mouth 2 (two) times daily.     No current facility-administered medications for this visit.     REVIEW OF SYSTEMS:  [X]  denotes positive finding, [ ]  denotes negative finding Cardiac  Comments:  Chest pain or chest pressure:    Shortness of breath upon exertion: x   Short of breath when lying flat:    Irregular heart rhythm:        Vascular    Pain in calf, thigh, or hip brought on by ambulation: x   Pain in feet at night that wakes you up from your sleep:  x   Blood clot in your veins:    Leg swelling:           PHYSICAL EXAM: Vitals:   05/15/17 1009  BP: 104/70  Pulse: 68  Resp: 18  Temp: 97.9 F (36.6 C)  TempSrc: Oral  SpO2: 91%  Weight: 131 lb 4.8 oz (59.6 kg)  Height: 5\' 5"  (1.651 m)    GENERAL: The patient is a well-nourished female, in no acute distress. The vital signs are documented above. CARDIOVASCULAR: Right neck incision is healed with no bruits in her bilateral carotids. No aneurysm palpable in her abdomen. 2+ dorsalis pedis pulses bilaterally PULMONARY: There is good air exchange  MUSCULOSKELETAL: There are no major deformities or cyanosis. NEUROLOGIC: No focal weakness or paresthesias are detected. SKIN: There are no ulcers or rashes noted. PSYCHIATRIC: The patient has a normal affect.  DATA:  Carotid duplex shows widely patent carotid bilaterally. Tortuosity on the left  Aortic ultrasound reveals no change with maximum diameter 3.3 cm  MEDICAL ISSUES: Patient reassured these findings. Will see [place] again in 2 years with repeat carotid duplex and aortic duplex    Rosetta Posner, MD [facility] of [facility] Tel [phone] Pager [phone]

## 2017-05-16 LAB — CULTURE, RESPIRATORY W GRAM STAIN: Culture: NORMAL

## 2017-05-16 LAB — CULTURE, RESPIRATORY

## 2017-05-17 ENCOUNTER — Other Ambulatory Visit (HOSPITAL_COMMUNITY): Payer: Self-pay | Admitting: Adult Health

## 2017-05-17 ENCOUNTER — Encounter: Payer: Self-pay | Admitting: Family Medicine

## 2017-05-20 ENCOUNTER — Encounter: Payer: Self-pay | Admitting: Family Medicine

## 2017-05-21 MED ORDER — ALPRAZOLAM 0.5 MG PO TABS: 0.5000 mg | ORAL_TABLET | Freq: Two times a day (BID) | ORAL | 2 refills | Status: DC | PRN

## 2017-05-24 NOTE — Addendum Note (Signed)
Addended by: Lianne Cure A on: 05/24/2017 03:44 PM   Modules accepted: Orders

## 2017-05-29 ENCOUNTER — Ambulatory Visit (HOSPITAL_COMMUNITY): Payer: PPO

## 2017-06-03 ENCOUNTER — Ambulatory Visit (HOSPITAL_COMMUNITY): Payer: PPO

## 2017-06-03 IMAGING — DX DG CHEST 2V
2 series · 2 of 2 positions shown · non-contrast
Comparison: June 07, 2015

CLINICAL DATA: Right-sided pleuritic type chest pain. History of
previous lung carcinoma

EXAM:
CHEST  2 VIEW

[chest pa]
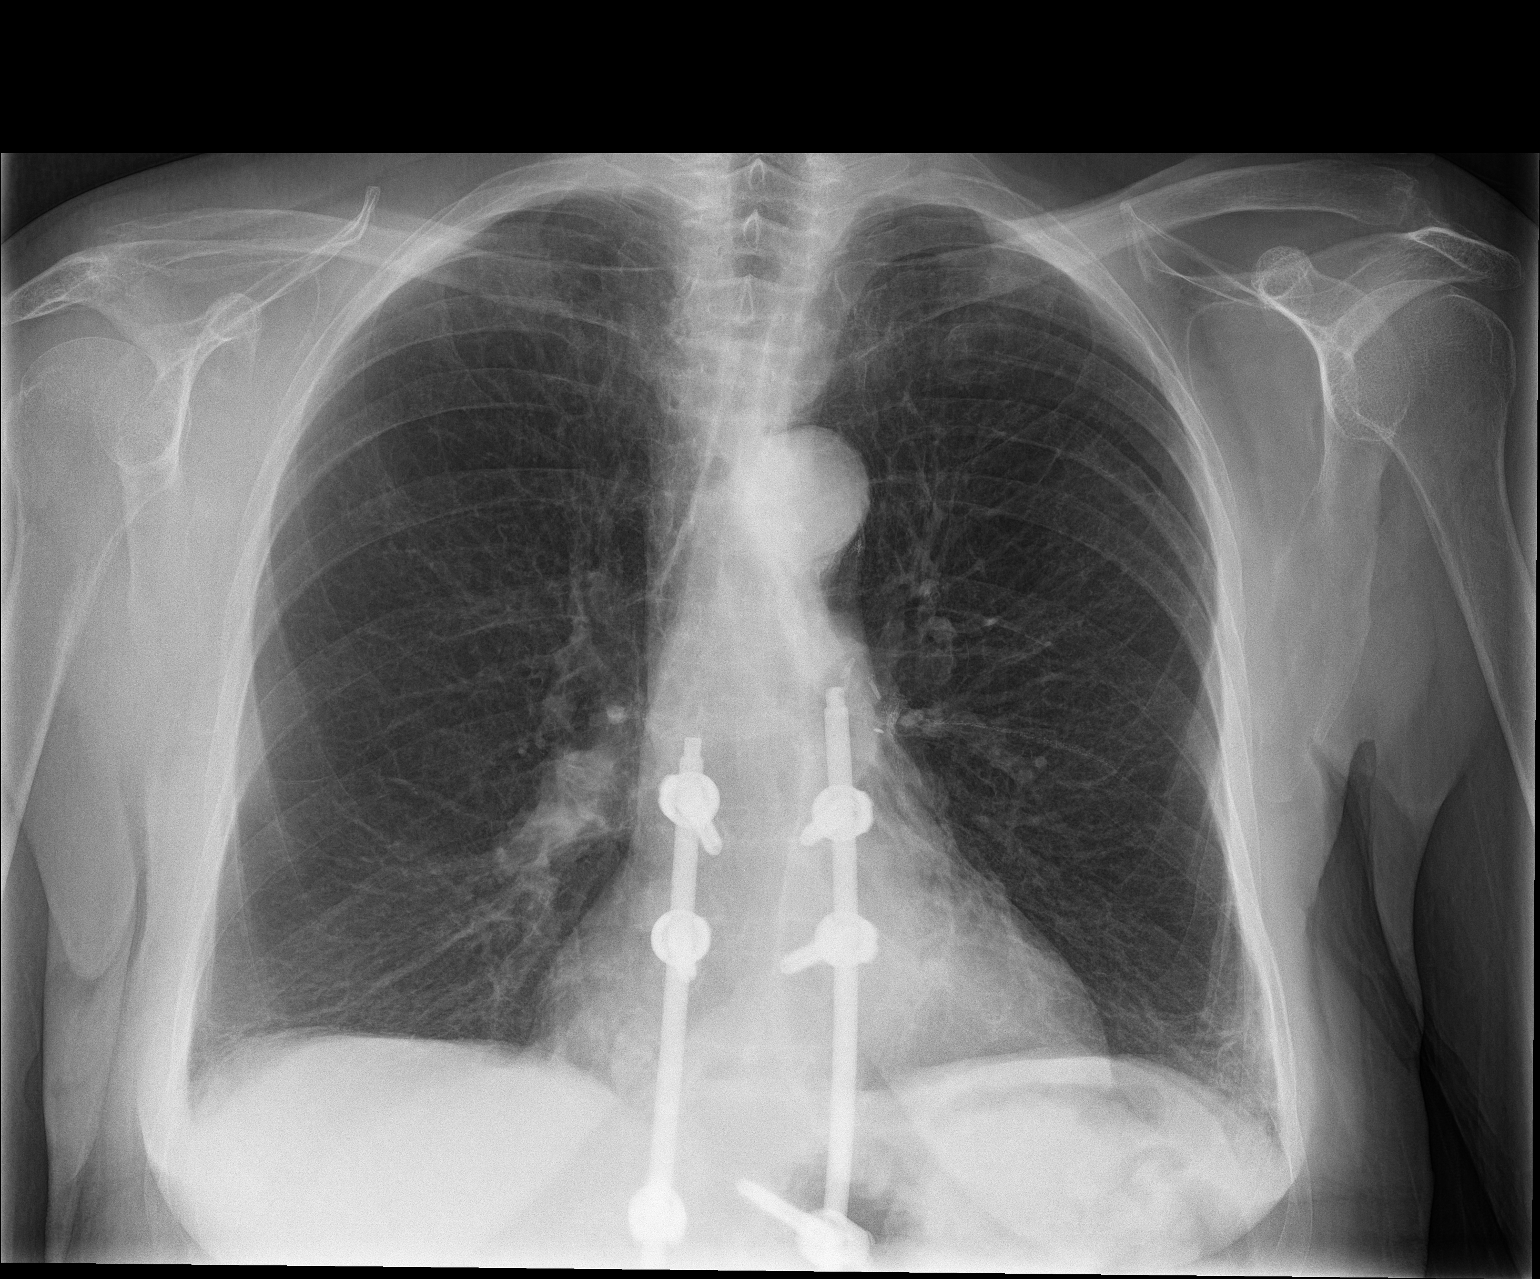

[chest lat]
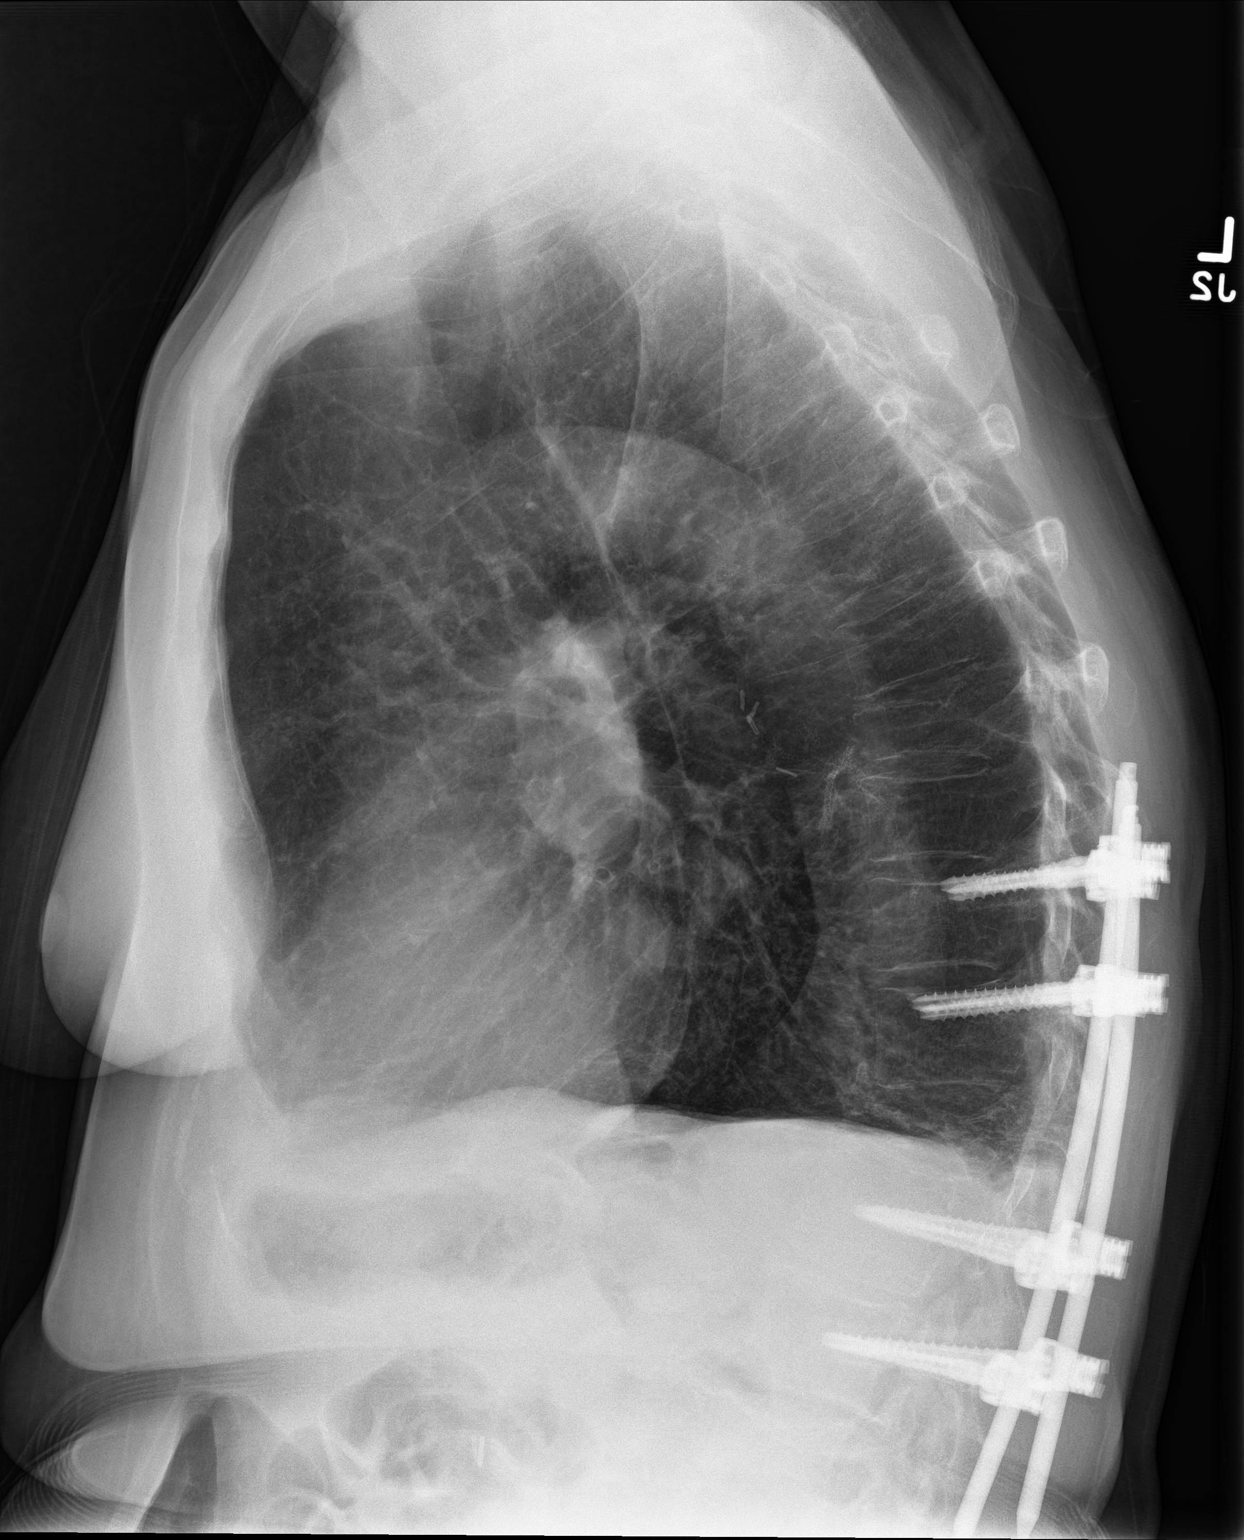

[2 of 2 positions shown; findings below may reference images not displayed]

FINDINGS: There is scarring in the lung bases which is stable. There is no
edema or consolidation. There is postoperative change on the left
with evidence of previous left lower lobectomy. No new opacity.
Heart size and pulmonary vascularity are normal. No adenopathy.
There is postoperative change at multiple levels in the lower
thoracic and visualized upper lumbar region. There is marked
collapse of the T12 vertebral body, stable. There is moderate
anterior wedging of the T8 vertebral body, stable. No pneumothorax.
IMPRESSION: Stable bibasilar lung scarring. No edema or consolidation. No change
in cardiac silhouette.

## 2017-06-04 DIAGNOSIS — C3490 Malignant neoplasm of unspecified part of unspecified bronchus or lung: Secondary | ICD-10-CM | POA: Diagnosis not present

## 2017-06-04 DIAGNOSIS — R0602 Shortness of breath: Secondary | ICD-10-CM | POA: Diagnosis not present

## 2017-06-04 DIAGNOSIS — R0902 Hypoxemia: Secondary | ICD-10-CM | POA: Diagnosis not present

## 2017-06-04 DIAGNOSIS — J9621 Acute and chronic respiratory failure with hypoxia: Secondary | ICD-10-CM | POA: Diagnosis not present

## 2017-06-11 ENCOUNTER — Ambulatory Visit (INDEPENDENT_AMBULATORY_CARE_PROVIDER_SITE_OTHER): Payer: PPO | Admitting: Family Medicine

## 2017-06-11 ENCOUNTER — Encounter: Payer: Self-pay | Admitting: Family Medicine

## 2017-06-11 VITALS — BP 124/60 | HR 70 | Temp 97.9°F | Resp 14 | Ht 65.0 in | Wt 133.0 lb

## 2017-06-11 DIAGNOSIS — I3139 Other pericardial effusion (noninflammatory): Secondary | ICD-10-CM

## 2017-06-11 DIAGNOSIS — I313 Pericardial effusion (noninflammatory): Secondary | ICD-10-CM

## 2017-06-11 DIAGNOSIS — J449 Chronic obstructive pulmonary disease, unspecified: Secondary | ICD-10-CM

## 2017-06-11 DIAGNOSIS — R5381 Other malaise: Secondary | ICD-10-CM

## 2017-06-11 DIAGNOSIS — M810 Age-related osteoporosis without current pathological fracture: Secondary | ICD-10-CM

## 2017-06-11 DIAGNOSIS — Z23 Encounter for immunization: Secondary | ICD-10-CM

## 2017-06-11 DIAGNOSIS — E871 Hypo-osmolality and hyponatremia: Secondary | ICD-10-CM

## 2017-06-11 DIAGNOSIS — E039 Hypothyroidism, unspecified: Secondary | ICD-10-CM

## 2017-06-11 DIAGNOSIS — E222 Syndrome of inappropriate secretion of antidiuretic hormone: Secondary | ICD-10-CM

## 2017-06-11 NOTE — Progress Notes (Signed)
Subjective:    Patient ID: [name], female    DOB: Dec 14, 1940, 76 y.o.   MRN: [id number]  HPI  Patient is here today at my request for a checkup. Past medical history is significant for COPD/emphysema requiring oxygen. She also has a history of osteoporosis. She is tried  prolia in the past but stopped due to cost. She has a history of hypothyroidism. She also has a history of hyperlipidemia and SIADH with hyponatremia.  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 She's been cancer free for many years. Recently was seen at her oncology office where a CT scan was obtained for surveillance. Results are listed below.  IMPRESSION: 1. Status post left lower lobe wedge resection. No findings to suggest local recurrence of disease or definite metastatic disease in the thorax. Previously noted ground-glass attenuation nodule in the superior segment of the right lower lobe appears slightly smaller than the prior study, suggesting a benign etiology. 2. Diffuse bronchial wall thickening with moderate to severe centrilobular and paraseptal emphysema; imaging findings suggestive of underlying COPD. 3. Aortic atherosclerosis, in addition to left main and left circumflex coronary artery disease. Assessment for potential risk factor modification, dietary therapy or pharmacologic therapy may be warranted, if clinically indicated. 4. Increasing anterior pericardial fluid and/or thickening. This is unlikely to be of hemodynamic significance at this time. No associated pericardial calcification. 5. New (but non acute) compression fracture of T6 with approximately 90% loss of anterior vertebral body height, in addition to multiple other chronic compression fractures of the visualize the thoracolumbar spine.  Past Medical History:  Diagnosis Date  . Adenocarcinoma of lung ([place]) 12/2010   left lung/surg only  . Allergic rhinitis   . 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 Anxiety disorder   . Aortic aneurysm ([place])   . Complication of anesthesia 2011   bloodpressure dropped during colonoscopy, not problems since  . Compression fracture   . COPD (chronic obstructive pulmonary disease) ([place])   . Depression   . Diastolic dysfunction   . Diverticulosis   . Dysphagia   . Emphysema lung ([place]) 11/08/2014  . Hiatal hernia   . Hypothyroidism   . IBS (irritable bowel syndrome)   . Iron deficiency anemia due to chronic blood loss 12/05/2016  . On home O2 12/15/2013   chronic hypoxia  . PUD (peptic ulcer disease)    20yr  . Shortness of breath    with exertion  . SIADH (syndrome of inappropriate ADH production) ([place]   . Trigeminal neuralgia    Past Surgical History:  Procedure Laterality Date  . ABDOMINAL HYSTERECTOMY    . BACK SURGERY  January 13, 2014  . CATARACT EXTRACTION    . CATARACT EXTRACTION W/PHACO  09/02/2012   Procedure: CATARACT EXTRACTION PHACO AND INTRAOCULAR LENS PLACEMENT (IOC);  Surgeon: MElta GuadeloupeT. SGershon Crane MD;  Location: [facility];  Service: Ophthalmology;  Laterality: Left;  CDE:10.35  . CHOLECYSTECTOMY    . 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 COLONOSCOPY N/A 11/23/2014   Procedure: COLONOSCOPY;  Surgeon: SDanie Binder MD;  [facility]: [facility];  Service: Endoscopy;  Laterality: N/A;  930   . ENDARTERECTOMY Right 08/21/2013   Procedure: RIGHT CAROTID ANEURYSM RESECTION;  Surgeon: TRosetta Posner MD;  [place] [facility]  Service: Vascular;  Laterality: Right;  . ESOPHAGOGASTRODUODENOSCOPY  11/13/2009  w/dilation to 71m, gastric ulceration (H.Pylori) s/p treatment  . ESOPHAGOGASTRODUODENOSCOPY  11/21/2009   distal esophageal web, gastritis  . ESOPHAGOGASTRODUODENOSCOPY  03/14/12   SLPF:XTKWIOXBDin the distal esophagus/Mild gastritis/small HH. + H.pylori, prescribed Pylera. Finished treatment.   . fatty tumor removal from lt groin    . FRACTURE  SURGERY Left    hand and left ring finger  . LUNG CANCER SURGERY  12/2010   Left VATS, minithoracotomy, LLL superior segmentectomy  . ORIF WRIST FRACTURE Left 04/09/2014   Procedure: OPEN REDUCTION INTERNAL FIXATION (ORIF) WRIST FRACTURE;  Surgeon: TRenette Butters MD;  [facility]  Service: Orthopedics;  Laterality: Left;  . PERCUTANEOUS PINNING Left 04/09/2014   Procedure: PERCUTANEOUS PINNING EXTREMITY;  Surgeon: TRenette Butters MD;  [place] [facility]  Service: Orthopedics;  Laterality: Left;  . TUBAL LIGATION    . vocal cord surgery  02/06/2011   laryngoscopy with bilateral vocal cord Radiesse injection for vocal cord paralysis  . YAG LASER APPLICATION Left 153/29/9242  Procedure: YAG LASER APPLICATION;  Surgeon: MRutherford Guys MD;  Location: [facility];  Service: Ophthalmology;  Laterality: Left;   Current Outpatient Prescriptions on File Prior to Visit  Medication Sig Dispense Refill  . acetaminophen (TYLENOL) 650 MG CR tablet Take 1 tablet (650 mg total) by mouth every 8 (eight) hours as needed for pain. (Patient taking differently: Take 500-650 mg by mouth every 8 (eight) hours as needed for pain. ) 30 tablet 0  . albuterol (PROVENTIL) (2.5 MG/3ML) 0.083% nebulizer solution Take 3 mLs (2.5 mg total) by nebulization every 6 (six) hours as needed for wheezing or shortness of breath. 75 mL 12  . ALPRAZolam (XANAX) 0.5 MG tablet Take 1 tablet (0.5 mg total) by mouth 2 (two) times daily as needed for anxiety or sleep. 60 tablet 2  . calcium citrate-vitamin D (CITRACAL+D) 315-200 MG-UNIT per tablet Take 1 tablet by mouth daily.     . citalopram (CELEXA) 40 MG tablet Take 0.5 tablets (20 mg total) by mouth at bedtime. 90 tablet 4  . levothyroxine (SYNTHROID, LEVOTHROID) 50 MCG tablet Take 1 tablet (50 mcg total) by mouth daily before breakfast. 90 tablet 3  . Multiple Vitamin (MULTIVITAMIN WITH MINERALS) TABS tablet Take 1 tablet by mouth daily.    .Marland Kitchenomeprazole (PRILOSEC) 40 MG capsule Take 1  capsule (40 mg total) by mouth daily. 90 capsule 3  . ondansetron (ZOFRAN) 4 MG tablet Take 1 tablet (4 mg total) by mouth 3 (three) times daily as needed for nausea or vomiting. 90 tablet 0  . umeclidinium-vilanterol (ANORO ELLIPTA) 62.5-25 MCG/INH AEPB Inhale 1 puff into the lungs daily. 90 each 3   No current facility-administered medications on file prior to visit.    Allergies  Allergen Reactions  . Ciprofloxacin Hcl Hives  . Iohexol      Desc: Per [facility], pt is allergic to IV contrast, no type of reaction was available. Pt cannot remember but first reacted around 15 yrs ago from an IVP. Notes were date from 2009 at urology center., Onset Date: [id number]  . Prozac [Fluoxetine Hcl] Rash  . Sulfonamide Derivatives Hives and Rash   Social History   Social History  . Marital status: Divorced    Spouse name: N/A  . Number of children: N/A  . Years of education: N/A   Occupational History  . Not on file.   Social History Main Topics  . Smoking status: Former Smoker    Packs/day: 0.50  Years: 20.00    Types: Cigarettes    Quit date: 12/21/2010  . Smokeless tobacco: Never Used     Comment: smoking cessation info given and reviewed   . Alcohol use 0.0 oz/week     Comment: seldom  . Drug use: No  . Sexual activity: Yes    Birth control/ protection: Surgical   Other Topics Concern  . Not on file   Social History Narrative  . No narrative on file    Review of Systems  All other systems reviewed and are negative.      Objective:   Physical Exam  Constitutional: She appears well-developed and well-nourished. No distress.  Neck: No JVD present. No thyromegaly present.  Cardiovascular: Normal rate, regular rhythm and normal heart sounds.   No murmur heard. Pulmonary/Chest: Effort normal. She has wheezes.  Abdominal: Soft. Bowel sounds are normal. She exhibits no distension. There is no tenderness. There is no rebound and no guarding.  Musculoskeletal: She  exhibits no edema.  Lymphadenopathy:    She has no cervical adenopathy.  Skin: She is not diaphoretic.          Assessment & Plan:  Age related osteoporosis, unspecified pathological fracture presence - Plan: DG Bone Density  Chronic obstructive pulmonary disease, unspecified COPD type ([place])  Hyponatremia - Plan: CBC with Differential/Platelet, COMPLETE METABOLIC PANEL WITH GFR  SIADH (syndrome of inappropriate ADH production) ([place])  Hypothyroidism, unspecified type - Plan: TSH  Physical deconditioning  Need for immunization against influenza - Plan: Flu Vaccine QUAD 36+ mos IM  Pericardial effusion - Plan: ECHOCARDIOGRAM COMPLETE  First, I am concerned about the numerous vertebral fractures and her documented history of osteoporosis. I will have the patient resume prolia in addition to 1200 mg a day of calcium and 1000 units a day of vitamin D. I will also repeat a bone density test. Patient recently had lab work obtained at her oncologist which is listed below: Hospital Outpatient Visit on 05/15/2017  Component Date Value Ref Range Status  . Right CCA prox sys 05/15/2017 90  cm/s Final  . Right CCA prox dias 05/15/2017 21  cm/s Final  . Right cca dist sys 05/15/2017 -61  cm/s Final  . Left CCA prox sys 05/15/2017 71  cm/s Final  . Left CCA prox dias 05/15/2017 21  cm/s Final  . Left CCA dist sys 05/15/2017 67  cm/s Final  . Left CCA dist dias 05/15/2017 25  cm/s Final  . Left ICA prox sys 05/15/2017 -71  cm/s Final  . Left ICA prox dias 05/15/2017 -30  cm/s Final  . Left ICA dist sys 05/15/2017 -67  cm/s Final  . Left ICA dist dias 05/15/2017 -28  cm/s Final  . RIGHT CCA MID DIAS 05/15/2017 24.00  cm/s Final  . RIGHT ECA DIAS 05/15/2017 -14.00  cm/s Final  . RIGHT VERTEBRAL DIAS 05/15/2017 -16.00  cm/s Final  . LEFT ECA DIAS 05/15/2017 13.00  cm/s Final  . LEFT VERTEBRAL DIAS 05/15/2017 -18.00  cm/s Final  Lab on 05/13/2017  Component Date Value Ref Range Status  .  WBC 05/13/2017 6.6  4.0 - 10.5 K/uL Final  . RBC 05/13/2017 4.49  3.87 - 5.11 MIL/uL Final  . Hemoglobin 05/13/2017 13.8  12.0 - 15.0 g/dL Final  . HCT 05/13/2017 40.4  36.0 - 46.0 % Final  . MCV 05/13/2017 90.0  78.0 - 100.0 fL Final  . MCH 05/13/2017 30.7  26.0 - 34.0 pg Final  . MCHC 05/13/2017  34.2  30.0 - 36.0 g/dL Final  . RDW 05/13/2017 13.0  11.5 - 15.5 % Final  . Platelets 05/13/2017 270  150 - 400 K/uL Final  . Neutrophils Relative % 05/13/2017 70  % Final  . Neutro Abs 05/13/2017 4.7  1.7 - 7.7 K/uL Final  . Lymphocytes Relative 05/13/2017 18  % Final  . Lymphs Abs 05/13/2017 1.2  0.7 - 4.0 K/uL Final  . Monocytes Relative 05/13/2017 8  % Final  . Monocytes Absolute 05/13/2017 0.5  0.1 - 1.0 K/uL Final  . Eosinophils Relative 05/13/2017 3  % Final  . Eosinophils Absolute 05/13/2017 0.2  0.0 - 0.7 K/uL Final  . Basophils Relative 05/13/2017 1  % Final  . Basophils Absolute 05/13/2017 0.0  0.0 - 0.1 K/uL Final  . Sodium 05/13/2017 136  135 - 145 mmol/L Final  . Potassium 05/13/2017 4.0  3.5 - 5.1 mmol/L Final  . Chloride 05/13/2017 99* 101 - 111 mmol/L Final  . CO2 05/13/2017 28  22 - 32 mmol/L Final  . Glucose, Bld 05/13/2017 107* 65 - 99 mg/dL Final  . BUN 05/13/2017 11  6 - 20 mg/dL Final  . Creatinine, Ser 05/13/2017 0.49  0.44 - 1.00 mg/dL Final  . Calcium 05/13/2017 9.8  8.9 - 10.3 mg/dL Final  . Total Protein 05/13/2017 7.5  6.5 - 8.1 g/dL Final  . Albumin 05/13/2017 4.4  3.5 - 5.0 g/dL Final  . AST 05/13/2017 19  15 - 41 U/L Final  . ALT 05/13/2017 14  14 - 54 U/L Final  . Alkaline Phosphatase 05/13/2017 42  38 - 126 U/L Final  . Total Bilirubin 05/13/2017 0.6  0.3 - 1.2 mg/dL Final  . GFR calc non Af Amer 05/13/2017 >60  >60 mL/min Final  . GFR calc Af Amer 05/13/2017 >60  >60 mL/min Final   Comment: (NOTE) The eGFR has been calculated using the CKD EPI equation. This calculation has not been validated in all clinical situations. eGFR's persistently <60 mL/min  signify possible Chronic Kidney Disease.   . Anion gap 05/13/2017 9  5 - 15 Final  . Iron 05/13/2017 98  28 - 170 ug/dL Final  . TIBC 05/13/2017 378  250 - 450 ug/dL Final  . Saturation Ratios 05/13/2017 26  10.4 - 31.8 % Final  . UIBC 05/13/2017 280  ug/dL Final   Performed at [facility] [address] [place], [place] [address]  . Ferritin 05/13/2017 31  11 - 307 ng/mL Final   Performed at [facility] [address], [place], [place] [address]  . TSH 05/13/2017 1.763  0.350 - 4.500 uIU/mL Final   Performed by a 3rd Generation assay with a functional sensitivity of <=0.01 uIU/mL.  Marland Kitchen Specimen Description 05/13/2017 SPU   Final  . Special Requests 05/13/2017 NONE   Final  . Gram Stain 05/13/2017    Final                   Value:MODERATE WBC PRESENT, PREDOMINANTLY PMN FEW SQUAMOUS EPITHELIAL CELLS PRESENT FEW GRAM POSITIVE COCCI FEW GRAM POSITIVE RODS RARE GRAM NEGATIVE RODS   . Culture 05/13/2017    Final                   Value:Consistent with normal respiratory flora. Performed at [facility], [place] [address], [place], [place] [address]   . Report Status 05/13/2017 05/16/2017 FINAL   Final   Thyroid is appropriately  dosed on her current dose of levothyroxine. Anemia is stable. Sodium level is normal. Therefore I'm very pleased with her lab work. However I'm concerned by the pericardial effusion seen on CAT scan. She has no history of a myocardial infarction. She has no history of a collagen vascular disease. There is no evidence of a malignancy causing the pericardial infusion. She denies any recent infection. Therefore I'm going to obtain an echocardiogram to quantify the volume of the pericardial effusion as well as to see cardiac output. If there is diminished cardiac output or sizable pericardial effusion, we may need to proceed with cardiology consultation for diagnostic tap. However the present time, the patient is asymptomatic and denies any chest pain or  shortness of breath. There is also coronary artery disease seen on her CT scan but again she is asymptomatic and in less there is diminished cardiac output, I see no reason to send the patient for a stress test of her heart.

## 2017-06-14 ENCOUNTER — Ambulatory Visit (HOSPITAL_COMMUNITY)
Admission: RE | Admit: 2017-06-14 | Discharge: 2017-06-14 | Disposition: A | Discharge status: Home / Self Care | Payer: PPO | Source: Ambulatory Visit | Attending: Family Medicine | Admitting: Family Medicine

## 2017-06-14 ENCOUNTER — Encounter: Payer: Self-pay | Admitting: Family Medicine

## 2017-06-14 DIAGNOSIS — J449 Chronic obstructive pulmonary disease, unspecified: Secondary | ICD-10-CM | POA: Insufficient documentation

## 2017-06-14 DIAGNOSIS — C3490 Malignant neoplasm of unspecified part of unspecified bronchus or lung: Secondary | ICD-10-CM | POA: Diagnosis not present

## 2017-06-14 DIAGNOSIS — Z87891 Personal history of nicotine dependence: Secondary | ICD-10-CM | POA: Insufficient documentation

## 2017-06-14 DIAGNOSIS — I3139 Other pericardial effusion (noninflammatory): Secondary | ICD-10-CM

## 2017-06-14 DIAGNOSIS — I729 Aneurysm of unspecified site: Secondary | ICD-10-CM | POA: Insufficient documentation

## 2017-06-14 DIAGNOSIS — E785 Hyperlipidemia, unspecified: Secondary | ICD-10-CM | POA: Diagnosis not present

## 2017-06-14 DIAGNOSIS — I313 Pericardial effusion (noninflammatory): Secondary | ICD-10-CM | POA: Insufficient documentation

## 2017-06-14 NOTE — Progress Notes (Signed)
*  PRELIMINARY RESULTS* Echocardiogram 2D Echocardiogram has been performed.  Leavy Cella 06/14/2017, 3:21 PM

## 2017-06-17 ENCOUNTER — Encounter: Payer: Self-pay | Admitting: *Deleted

## 2017-06-18 ENCOUNTER — Encounter: Payer: Self-pay | Admitting: Family Medicine

## 2017-06-19 ENCOUNTER — Encounter: Payer: Self-pay | Admitting: Family Medicine

## 2017-06-20 ENCOUNTER — Encounter: Payer: Self-pay | Admitting: Family Medicine

## 2017-06-20 ENCOUNTER — Other Ambulatory Visit: Payer: Self-pay | Admitting: *Deleted

## 2017-06-20 DIAGNOSIS — R5381 Other malaise: Secondary | ICD-10-CM

## 2017-06-24 ENCOUNTER — Telehealth (HOSPITAL_COMMUNITY): Payer: Self-pay | Admitting: Family Medicine

## 2017-06-24 NOTE — Telephone Encounter (Signed)
917pt wanted to change the appt to 07/05/16

## 2017-06-25 ENCOUNTER — Ambulatory Visit (HOSPITAL_COMMUNITY)
Admission: RE | Admit: 2017-06-25 | Discharge: 2017-06-25 | Disposition: A | Discharge status: Home / Self Care | Payer: PPO | Source: Ambulatory Visit | Attending: Family Medicine | Admitting: Family Medicine

## 2017-06-25 DIAGNOSIS — E559 Vitamin D deficiency, unspecified: Secondary | ICD-10-CM | POA: Insufficient documentation

## 2017-06-25 DIAGNOSIS — M810 Age-related osteoporosis without current pathological fracture: Secondary | ICD-10-CM | POA: Diagnosis not present

## 2017-06-25 DIAGNOSIS — Z780 Asymptomatic menopausal state: Secondary | ICD-10-CM | POA: Diagnosis not present

## 2017-06-28 ENCOUNTER — Encounter: Payer: Self-pay | Admitting: Family Medicine

## 2017-07-02 ENCOUNTER — Ambulatory Visit (HOSPITAL_COMMUNITY): Payer: PPO | Admitting: Physical Therapy

## 2017-07-03 ENCOUNTER — Telehealth: Payer: Self-pay | Admitting: Family Medicine

## 2017-07-03 NOTE — Telephone Encounter (Signed)
Patient contacted office yesterday in reference to her Prolia shot. She states that she paid almost $225 for the one she had in March. She states that if she was going to be charged that amount again on Friday that she might not be able to get the shot since she is on a fixed income and her children and grandchildren are holding their hands out for money. I looked at patients statement from last shot and she paid $212.99 for the Prolia and $10 for the admin fee. She states that her insurance company told her that she should have only paid $75 that we must have coded it as an office visit or to her medical plan. I advised patient that we didn't charge an office visit that we charged insurance for the prolia $2100 and admin fee $60.00. Insurance paid all of the prolia except $212.99 and NRP to patient and on the admin fee they NRP $10.00. I told patient that I would check with Maudie Mercury and see what the Summary of Benefits from [facility] stated. She states that Maudie Mercury told her she would owe $85.00 for the injection in March. Maudie Mercury was out of the office until the morning when I will talk with her.

## 2017-07-03 NOTE — Telephone Encounter (Signed)
I spoke with Maudie Mercury this am. She states that she never tells the patient what their portion is since she doesn't know if they have a co-insurance is going to pay and most of the time if she tells the patient that they will owe 20% they won't come in and have shot. She did state that she tells them that if they receive a bill that they can make payments on their account.   I called Mrs. Blow back and advised her of mine and Kim's conversation. I also read the summary benefit for the injection she is scheduled for on Friday. It states that the patient is subject to 20% co-insurance up to $3400 out of pocket max ($625.31 met). Patient states that she wouldn't have just made up numbers an that she is going to call the insurance company again to see if she can get clarification from them since they told her yesterday that she should have only paid $75 on last prolia shot. Patient is going to return my call after she talks with them and let us know if she is going to proceed on Friday for the injection. I also told her that once she receives the bill she can make payments on it she didn't have to pay it all at once. She states she knows that.

## 2017-07-05 ENCOUNTER — Ambulatory Visit: Payer: Self-pay

## 2017-07-05 ENCOUNTER — Encounter: Payer: Self-pay | Admitting: Family Medicine

## 2017-07-05 ENCOUNTER — Ambulatory Visit (INDEPENDENT_AMBULATORY_CARE_PROVIDER_SITE_OTHER): Payer: PPO | Admitting: Family Medicine

## 2017-07-05 ENCOUNTER — Encounter (HOSPITAL_COMMUNITY): Payer: Self-pay

## 2017-07-05 ENCOUNTER — Ambulatory Visit (HOSPITAL_COMMUNITY): Payer: PPO | Attending: Family Medicine

## 2017-07-05 VITALS — BP 130/80 | HR 67 | Temp 98.0°F | Resp 18 | Ht 66.0 in | Wt 136.0 lb

## 2017-07-05 DIAGNOSIS — M810 Age-related osteoporosis without current pathological fracture: Secondary | ICD-10-CM | POA: Diagnosis not present

## 2017-07-05 DIAGNOSIS — J9621 Acute and chronic respiratory failure with hypoxia: Secondary | ICD-10-CM | POA: Diagnosis not present

## 2017-07-05 DIAGNOSIS — M546 Pain in thoracic spine: Secondary | ICD-10-CM

## 2017-07-05 DIAGNOSIS — K047 Periapical abscess without sinus: Secondary | ICD-10-CM

## 2017-07-05 DIAGNOSIS — R2681 Unsteadiness on feet: Secondary | ICD-10-CM | POA: Insufficient documentation

## 2017-07-05 DIAGNOSIS — R293 Abnormal posture: Secondary | ICD-10-CM

## 2017-07-05 DIAGNOSIS — R0902 Hypoxemia: Secondary | ICD-10-CM | POA: Diagnosis not present

## 2017-07-05 DIAGNOSIS — R0602 Shortness of breath: Secondary | ICD-10-CM | POA: Diagnosis not present

## 2017-07-05 DIAGNOSIS — M6281 Muscle weakness (generalized): Secondary | ICD-10-CM | POA: Diagnosis not present

## 2017-07-05 DIAGNOSIS — C3490 Malignant neoplasm of unspecified part of unspecified bronchus or lung: Secondary | ICD-10-CM | POA: Diagnosis not present

## 2017-07-05 MED ORDER — DENOSUMAB 60 MG/ML ~~LOC~~ SOLN
60.0000 mg | Freq: Once | SUBCUTANEOUS | Status: AC
  Administered 2017-07-05: 60 mg via SUBCUTANEOUS

## 2017-07-05 NOTE — Therapy (Signed)
[place], [place], [address] Phone: [phone]   Fax:  [phone]  Physical Therapy Evaluation  Patient Details  Name: [name] MRN: [id number] Date of Birth: November 18, 1940 Referring Provider: Jenna Luo, MD  Encounter Date: 07/05/2017      PT End of Session - 07/05/17 1120    Visit Number 1   Number of Visits 13   Date for PT Re-Evaluation 07/26/17   Authorization Type [employer]   Authorization Time Period 07/05/17 to 08/16/17   Authorization - Visit Number 1   Authorization - Number of Visits 10   PT Start Time 0947   PT Stop Time 1027   PT Time Calculation (min) 40 min   Equipment Utilized During Treatment Gait belt   Activity Tolerance Patient tolerated treatment well;No increased pain   Behavior During Therapy WFL for tasks assessed/performed      Past Medical History:  Diagnosis Date  . Adenocarcinoma of lung ([place]) 12/2010   left lung/surg only  . Allergic rhinitis   . 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 Anxiety disorder   . Aortic aneurysm ([place])   . Complication of anesthesia 2011   bloodpressure dropped during colonoscopy, not problems since  . Compression fracture   . COPD (chronic obstructive pulmonary disease) ([place])   . Depression   . Diastolic dysfunction   . Diverticulosis   . Dysphagia   . Emphysema lung ([place]) 11/08/2014  . Hiatal hernia   . Hypothyroidism   . IBS (irritable bowel syndrome)   . Iron deficiency anemia due to chronic blood loss 12/05/2016  . On home O2 12/15/2013   chronic hypoxia  . PUD (peptic ulcer disease)    26yrs  . Shortness of breath    with exertion  . SIADH (syndrome of inappropriate ADH production) ([place])   . Trigeminal neuralgia     Past Surgical History:  Procedure Laterality Date  . ABDOMINAL HYSTERECTOMY    . BACK SURGERY  January 13, 2014  . CATARACT EXTRACTION    . CATARACT EXTRACTION W/PHACO   09/02/2012   Procedure: CATARACT EXTRACTION PHACO AND INTRAOCULAR LENS PLACEMENT (IOC);  Surgeon: Elta Guadeloupe T. Gershon Crane, MD;  Location: [facility];  Service: Ophthalmology;  Laterality: Left;  CDE:10.35  . CHOLECYSTECTOMY    . 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 COLONOSCOPY N/A 11/23/2014   Procedure: COLONOSCOPY;  Surgeon: Danie Binder, MD;  [facility];  Service: Endoscopy;  Laterality: N/A;  930   . ENDARTERECTOMY Right 08/21/2013   Procedure: RIGHT CAROTID ANEURYSM RESECTION;  Surgeon: Rosetta Posner, MD;  Location: [facility] OR;  Service: Vascular;  Laterality: Right;  . ESOPHAGOGASTRODUODENOSCOPY  11/13/2009   w/dilation to 20mm, gastric ulceration (H.Pylori) s/p treatment  . ESOPHAGOGASTRODUODENOSCOPY  11/21/2009   distal esophageal web, gastritis  . ESOPHAGOGASTRODUODENOSCOPY  03/14/12   VOZ:[id number] in the distal esophagus/Mild gastritis/small HH. + H.pylori, prescribed Pylera. Finished treatment.   . fatty tumor removal from lt groin    . FRACTURE SURGERY Left    hand and left ring finger  . LUNG CANCER SURGERY  12/2010   Left VATS, minithoracotomy, LLL superior segmentectomy  . ORIF WRIST FRACTURE Left 04/09/2014   Procedure: OPEN REDUCTION INTERNAL FIXATION (ORIF) WRIST FRACTURE;  Surgeon: Renette Butters, MD;  Location: [facility];  Service: Orthopedics;  Laterality: Left;  . PERCUTANEOUS PINNING Left 04/09/2014  Procedure: PERCUTANEOUS PINNING EXTREMITY;  Surgeon: Renette Butters, MD;  Location: [facility];  Service: Orthopedics;  Laterality: Left;  . TUBAL LIGATION    . vocal cord surgery  02/06/2011   laryngoscopy with bilateral vocal cord Radiesse injection for vocal cord paralysis  . YAG LASER APPLICATION Left 02/58/5277   Procedure: YAG LASER APPLICATION;  Surgeon: Rutherford Guys, MD;  [facility]: [facility];  Service: Ophthalmology;  Laterality: Left;    There were no vitals filed for this visit.       Subjective Assessment - 07/05/17 0953     Subjective Pt states that 3 years ago, she broke her T12 vertebrae from a fall. Since then she hasn't been as active due to her pain. Her last fall was in December 2017 and broke T6. She states that her her legs are pretty strong, her back has gotten weaker. She is having the most difficulty perfoming household duties (cleaning, cooking, etc.) due to her back muscle spasm. Her R leg hurts a little more than the L. She's been using a RW since her back surgery in 2015; she had gotten back to a [facility] until her last fall.    Limitations Standing;House hold activities;Walking   How long can you sit comfortably? not long   How long can you stand comfortably? 15 mins or <   How long can you walk comfortably? 5 mins with RW   Patient Stated Goals walk without a RW and get over fear of falling   Currently in Pain? Yes   Pain Score 1    Pain Location Back   Pain Orientation Mid;Right   Pain Descriptors / Indicators Aching   Pain Type Chronic pain   Pain Onset More than a month ago   Pain Frequency Intermittent   Aggravating Factors  walking, standing, sitting for too long   Pain Relieving Factors laying flat   Effect of Pain on Daily Activities increases            [facility] PT Assessment - 07/05/17 0001      Assessment   Medical Diagnosis physical deconditioning   Referring Provider Jenna Luo, MD   Onset Date/Surgical Date --  progressively getting worse over last 3 years   Next MD Visit 07/05/17   Prior Therapy rehab and HHPT following back surgery     Precautions   Precautions Fall     Balance Screen   Has the patient fallen in the past 6 months No   Has the patient had a decrease in activity level because of a fear of falling?  Yes   Is the patient reluctant to leave their home because of a fear of falling?  Yes     Prior Function   Level of Independence Independent;Independent with basic ADLs   Vocation Retired   Leisure cook     ROM / Strength   AROM / PROM / Strength  Strength     Strength   Right Hip Flexion 4+/5   Right Hip Extension 4/5   Right Hip ABduction 4-/5   Left Hip Flexion 5/5   Left Hip Extension 4/5   Left Hip ABduction 4/5   Right Knee Flexion 5/5   Right Knee Extension 5/5   Left Knee Flexion 5/5   Left Knee Extension 5/5   Right Ankle Dorsiflexion 4+/5   Left Ankle Dorsiflexion 5/5     Palpation   Spinal mobility not assessed   Palpation comment thoracic and lumbar paraspinals, periscapular musculature  Ambulation/Gait   Ambulation Distance (Feet) 50 Feet  around gym   Assistive device Rolling walker   Gait Pattern Step-through pattern;Decreased arm swing - right;Decreased arm swing - left;Decreased stride length;Decreased hip/knee flexion - right;Decreased hip/knee flexion - left;Trunk flexed  decreased hip extension BLE     6 Minute Walk- Baseline   6 Minute Walk- Baseline --  to be assessed next visit     Balance   Balance Assessed Yes     Static Standing Balance   Static Standing - Balance Support No upper extremity supported   Static Standing Balance -  Activities  Single Leg Stance - Right Leg;Single Leg Stance - Left Leg   Static Standing - Comment/# of Minutes R: 1 sec or <, L: 5 sec or <     Standardized Balance Assessment   Standardized Balance Assessment Five Times Sit to Stand;Timed Up and Go Test;Berg Balance Test   Five times sit to stand comments  16 sec from chair, no UE     Berg Balance Test   Berg comment: to be assessed next visit     Timed Up and Go Test   Normal TUG (seconds) 19.7  RW         Objective measurements completed on examination: See above findings.            PT Education - 07/05/17 1026    Education provided Yes   Education Details exam findings, POC, HEP   Person(s) Educated Patient   Methods Explanation;Demonstration;Handout   Comprehension Verbalized understanding;Returned demonstration          PT Short Term Goals - 07/05/17 1215      PT SHORT  TERM GOAL #1   Title Pt will be independent with HEP and perform consistently in order to maximize overall function.    Time 3   Period Weeks   Status New   Target Date 07/26/17     PT SHORT TERM GOAL #2   Title Pt will have at least 4+/5 MMT of all muscle groups tested in order to maximize gait and decrease risk for falls.   Time 3   Period Weeks   Status New     PT SHORT TERM GOAL #3   Title Pt will be able to demonstrate proper setup and use of lumbar roll while sitting to maximize posture and decrease pain.   Time 3   Period Weeks   Status New           PT Long Term Goals - 07/05/17 1216      PT LONG TERM GOAL #1   Title Pt will report an improved tolerance to standing and walking by at least 50% (to at least 30 mins standing and 10 mins walking) to maximize her ability to perform chores at home.    Time 6   Period Weeks   Status New   Target Date 08/16/17     PT LONG TERM GOAL #2   Title Pt will be able to perform SLS on BLE for at least 10 sec to maximize gait and balance.    Time 6   Period Weeks   Status New     PT LONG TERM GOAL #3   Title Pt will have an improved 6MWT by 175ft or > and with LRAD to demo improve overall tolerance to movement in order to maximize participation at home.    Time 6   Period Weeks   Status New  PT LONG TERM GOAL #4   Title Pt will score 52/56 on BERG to demo improved balance and decrease risk for falls.    Time 6   Period Weeks   Status New                Plan - 07/05/17 1123    Clinical Impression Statement Pt is pleasant 76 YO F who presents to [facility] with c/o mid-back pain and general deconditioning s/p falls in 12/23/13 and December 24, 2015. Pt has T6 compression fracture from the fall in December and had surgery for her T12 compression fracture that stemmed from her fall in December 23, 2013 (per chart review, pt had T12 laminectomy with bilateral facetectomy, non-segmental pedicle screw instrumentation at T10, T11, T12, L1, L2 in 12/23/2013). Pt  currently presents with deficits in BLE strength, especially of proximal musculature, functional strength, balance, gait, posture, and difficulty performing IADLs due to pain. Pt also had increased soft tissue restrictions of bil lumbar and thoracic paraspinals. Pt scored below the average for her age-matched peers for 5xSTS and TUG as evidenced above. Pt needs skilled PT intervention to address these deficits in order to maximize overall function and QOL.   History and Personal Factors relevant to plan of care: on 2L O2; h/o lung cancer, aortic aneurysm, COPD; motivated, was very active prior to falls.   Clinical Presentation Stable   Clinical Presentation due to: compression fracture T6, deficits in posture, gait, balance, MMT, functional strength, difficulty with IADLs; soft tissue restrictions   Clinical Decision Making Low   Rehab Potential Fair   PT Frequency 2x / week   PT Duration 6 weeks   PT Treatment/Interventions ADLs/Self Care Home Management;Cryotherapy;Electrical Stimulation;Moist Heat;DME Instruction;Gait training;Stair training;Functional mobility training;Therapeutic activities;Therapeutic exercise;Balance training;Neuromuscular re-education;Patient/family education;Manual techniques;Passive range of motion;Dry needling;Energy conservation;Taping   PT Next Visit Plan Review goals, HEP; perform 6MWT, BERG; avoid flexion as pt has compression fracture; begin decompression 1-5, functional BLE strengthening, postural strengthening, and balance activities.   PT Home Exercise Plan eval: standing scapular retraction and sitting with lumbar roll   Consulted and Agree with Plan of Care Patient      Patient will benefit from skilled therapeutic intervention in order to improve the following deficits and impairments:  Cardiopulmonary status limiting activity, Decreased activity tolerance, Decreased balance, Decreased endurance, Decreased mobility, Decreased range of motion, Decreased strength,  Hypomobility, Increased muscle spasms, Impaired flexibility, Improper body mechanics, Postural dysfunction, Pain  Visit Diagnosis: Pain in thoracic spine - Plan: PT plan of care cert/re-cert  Abnormal posture - Plan: PT plan of care cert/re-cert  Muscle weakness (generalized) - Plan: PT plan of care cert/re-cert  Unsteadiness on feet - Plan: PT plan of care cert/re-cert      G-Codes - 95/09/32 12-23-1216    Functional Assessment Tool Used (Outpatient Only) clinical judgement, 5xSTS, TUG, SLS, MMT   Functional Limitation Mobility: Walking and moving around   Mobility: Walking and Moving Around Current Status [phone]) At least 60 percent but less than 80 percent impaired, limited or restricted   Mobility: Walking and Moving Around Goal Status [phone]) At least 20 percent but less than 40 percent impaired, limited or restricted       Problem List Patient Active Problem List   Diagnosis Date Noted  . Iron deficiency anemia 12/05/2016  . SIADH (syndrome of inappropriate ADH production) ([place])   . Polypharmacy 09/14/2016  . Muscle weakness (generalized)   . Syncope 09/10/2016  . Rib pain on left side   . Hypersomnia 02/16/2016  .  Acute bronchitis 02/16/2016  . Exertional dyspnea 12/20/2015  . HLD (hyperlipidemia) 11/23/2014  . Change in bowel habits 11/10/2014  . Abdominal pain, left lower quadrant 11/10/2014  . Emphysema lung ([place]) 11/08/2014  . Helicobacter pylori gastritis 07/30/2014  . Acute on chronic respiratory failure with hypoxia ([place]) 04/10/2014  . Wrist fracture 04/09/2014  . 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 T12 burst fracture ([place]) 01/13/2014  . Constipation 12/14/2013  . Acute respiratory failure with hypoxia ([place]) 12/14/2013  . Hyponatremia 12/14/2013  . COPD (chronic obstructive pulmonary disease) ([place]) 12/14/2013  . Compression fracture of thoracic vertebra ([place]) 12/14/2013  . Hypoxia 12/13/2013  .  Compression fracture 12/13/2013  . Diastolic dysfunction   . 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 Dyspepsia 02/11/2012  . Aneurysm ([place]) 02/11/2012  . Trigeminal neuralgia   . Epigastric pain 02/21/2011  . Esophageal dysphagia 02/21/2011  . Adenocarcinoma of lung ([place]) 12/07/2010  . CONTRAST DYE ALLERGY 12/30/2009       [name] PT, DPT  [facility] [address] [place], [place], [address] Phone: [phone]   Fax:  [phone]  Name: [name] MRN: [id number] Date of Birth: [birth year]

## 2017-07-05 NOTE — Progress Notes (Signed)
Subjective:    Patient ID: [name], female    DOB: November 08, 1940, 76 y.o.   MRN: [id number]  HPI  The patient reports pain in her right upper canine tooth/incisor area. The gum above this tooth is erythematous, indurated, tender to the touch, and friable. She called her dentist who started her on amoxicillin. She is yet to follow-up with the dentist. She denies any facial pain or headache Past Medical History:  Diagnosis Date  . 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 Allergic rhinitis   . 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 Anxiety disorder   . Aortic aneurysm ([place])   . Complication of anesthesia 2011   bloodpressure dropped during colonoscopy, not problems since  . Compression fracture   . COPD (chronic obstructive pulmonary disease) ([place])   . Depression   . Diastolic dysfunction   . Diverticulosis   . Dysphagia   . Emphysema lung ([place]) 11/08/2014  . Hiatal hernia   . Hypothyroidism   . IBS (irritable bowel syndrome)   . Iron deficiency anemia due to chronic blood loss 12/05/2016  . On home O2 12/15/2013   chronic hypoxia  . PUD (peptic ulcer disease)    53yrs  . Shortness of breath    with exertion  . SIADH (syndrome of inappropriate ADH production) ([place])   . Trigeminal neuralgia    Past Surgical History:  Procedure Laterality Date  . ABDOMINAL HYSTERECTOMY    . BACK SURGERY  January 13, 2014  . CATARACT EXTRACTION    . CATARACT EXTRACTION W/PHACO  09/02/2012   Procedure: CATARACT EXTRACTION PHACO AND INTRAOCULAR LENS PLACEMENT (IOC);  Surgeon: Elta Guadeloupe T. Gershon Crane, MD;  Location: [facility];  Service: Ophthalmology;  Laterality: Left;  CDE:10.35  . CHOLECYSTECTOMY    . 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 COLONOSCOPY N/A 11/23/2014   Procedure: COLONOSCOPY;  Surgeon: Danie Binder, MD;  [facility];  Service: Endoscopy;  Laterality: N/A;  930   . ENDARTERECTOMY Right  08/21/2013   Procedure: RIGHT CAROTID ANEURYSM RESECTION;  Surgeon: Rosetta Posner, MD;  Location: [facility] OR;  Service: Vascular;  Laterality: Right;  . ESOPHAGOGASTRODUODENOSCOPY  11/13/2009   w/dilation to 47mm, gastric ulceration (H.Pylori) s/p treatment  . ESOPHAGOGASTRODUODENOSCOPY  11/21/2009   distal esophageal web, gastritis  . ESOPHAGOGASTRODUODENOSCOPY  03/14/12   AST:[id number] in the distal esophagus/Mild gastritis/small HH. + H.pylori, prescribed Pylera. Finished treatment.   . fatty tumor removal from lt groin    . FRACTURE SURGERY Left    hand and left ring finger  . LUNG CANCER SURGERY  12/2010   Left VATS, minithoracotomy, LLL superior segmentectomy  . ORIF WRIST FRACTURE Left 04/09/2014   Procedure: OPEN REDUCTION INTERNAL FIXATION (ORIF) WRIST FRACTURE;  Surgeon: Renette Butters, MD;  Location: [facility];  Service: Orthopedics;  Laterality: Left;  . PERCUTANEOUS PINNING Left 04/09/2014   Procedure: PERCUTANEOUS PINNING EXTREMITY;  Surgeon: Renette Butters, MD;  Location: [facility];  Service: Orthopedics;  Laterality: Left;  . TUBAL LIGATION    . vocal cord surgery  02/06/2011   laryngoscopy with bilateral vocal cord Radiesse injection for vocal cord paralysis  . YAG LASER APPLICATION Left 98/92/1194   Procedure: YAG LASER APPLICATION;  Surgeon: Rutherford Guys, MD;  Location: [facility];  Service: Ophthalmology;  Laterality: Left;   Current Outpatient Prescriptions on File Prior to Visit  Medication  Sig Dispense Refill  . acetaminophen (TYLENOL) 650 MG CR tablet Take 1 tablet (650 mg total) by mouth every 8 (eight) hours as needed for pain. (Patient taking differently: Take 500-650 mg by mouth every 8 (eight) hours as needed for pain. ) 30 tablet 0  . albuterol (PROVENTIL) (2.5 MG/3ML) 0.083% nebulizer solution Take 3 mLs (2.5 mg total) by nebulization every 6 (six) hours as needed for wheezing or shortness of breath. 75 mL 12  . ALPRAZolam (XANAX) 0.5 MG tablet Take 1 tablet (0.5 mg total) by mouth 2  (two) times daily as needed for anxiety or sleep. 60 tablet 2  . calcium citrate-vitamin D (CITRACAL+D) 315-200 MG-UNIT per tablet Take 1 tablet by mouth daily.     . citalopram (CELEXA) 40 MG tablet Take 0.5 tablets (20 mg total) by mouth at bedtime. 90 tablet 4  . levothyroxine (SYNTHROID, LEVOTHROID) 50 MCG tablet Take 1 tablet (50 mcg total) by mouth daily before breakfast. 90 tablet 3  . magnesium oxide (MAG-OX) 400 MG tablet Take 400 mg by mouth 2 (two) times daily.    . Multiple Vitamin (MULTIVITAMIN WITH MINERALS) TABS tablet Take 1 tablet by mouth daily.    Marland Kitchen omeprazole (PRILOSEC) 40 MG capsule Take 1 capsule (40 mg total) by mouth daily. 90 capsule 3  . ondansetron (ZOFRAN) 4 MG tablet Take 1 tablet (4 mg total) by mouth 3 (three) times daily as needed for nausea or vomiting. 90 tablet 0  . umeclidinium-vilanterol (ANORO ELLIPTA) 62.5-25 MCG/INH AEPB Inhale 1 puff into the lungs daily. 90 each 3  . VITAMIN K PO Take by mouth.     No current facility-administered medications on file prior to visit.    Allergies  Allergen Reactions  . Ciprofloxacin Hcl Hives  . Iohexol      Desc: Per [facility], pt is allergic to IV contrast, no type of reaction was available. Pt cannot remember but first reacted around 15 yrs ago from an IVP. Notes were date from 2009 at urology center., Onset Date: [id number]   . Prozac [Fluoxetine Hcl] Rash  . Sulfonamide Derivatives Hives and Rash   Social History   Social History  . Marital status: Divorced    Spouse name: N/A  . Number of children: N/A  . Years of education: N/A   Occupational History  . Not on file.   Social History Main Topics  . Smoking status: Former Smoker    Packs/day: 0.50    Years: 20.00    Types: Cigarettes    Quit date: 12/21/2010  . Smokeless tobacco: Never Used     Comment: smoking cessation info given and reviewed   . Alcohol use 0.0 oz/week     Comment: seldom  . Drug use: No  . Sexual activity: Yes    Birth  control/ protection: Surgical   Other Topics Concern  . Not on file   Social History Narrative  . No narrative on file    Review of Systems  All other systems reviewed and are negative.      Objective:   Physical Exam  Constitutional: She appears well-developed and well-nourished. No distress.  Neck: No JVD present. No thyromegaly present.  Cardiovascular: Normal rate, regular rhythm and normal heart sounds.   No murmur heard. Pulmonary/Chest: Effort normal. She has wheezes.  Abdominal: Soft. Bowel sounds are normal. She exhibits no distension. There is no tenderness. There is no rebound and no guarding.  Musculoskeletal: She exhibits no edema.  Lymphadenopathy:  She has no cervical adenopathy.  Skin: She is not diaphoretic.   The gum above her right upper canine is erythematous swollen and friable. It bleeds easily gently being touched with the tongue depressor. It is also tender and painful. There is no purulence that I'm able to express.       Assessment & Plan:  Infected tooth  Patient is already appropriately treated with amoxicillin by her dentist. I recommended she complete the amoxicillin and follow with the dentist next week to discuss having the tooth pulled to prevent future recurrences. She also received her prolia injection today.

## 2017-07-05 NOTE — Patient Instructions (Signed)
  Scapular Retraction  Wrap an elastic band around a door knob or banister. Grab the ends of the band with both hands with your arms extended. With good posture, pull the band backwards and squeeze your shoulder blades together for 3 seconds. Make sure your elbows stay close to your body.    Perform 2x/day, 2-3 sets of 10 reps with red band   Seated Posture with Lumbar Roll  Place the lumbar roll as shown in the picture on the left.  Slide your bottom to the back of the chair and sit up straight so the roll provides support in the natural curve of your lower back.  Keep your shoulders back and head in a neutral position.  Maintain this position at all time when sitting.

## 2017-07-05 NOTE — Addendum Note (Signed)
Addended by: Shary Decamp B on: 07/05/2017 02:18 PM   Modules accepted: Orders

## 2017-07-09 ENCOUNTER — Ambulatory Visit (HOSPITAL_COMMUNITY): Payer: PPO | Attending: Family Medicine

## 2017-07-09 ENCOUNTER — Encounter (HOSPITAL_COMMUNITY): Payer: Self-pay

## 2017-07-09 DIAGNOSIS — R293 Abnormal posture: Secondary | ICD-10-CM | POA: Diagnosis not present

## 2017-07-09 DIAGNOSIS — M6281 Muscle weakness (generalized): Secondary | ICD-10-CM

## 2017-07-09 DIAGNOSIS — M546 Pain in thoracic spine: Secondary | ICD-10-CM

## 2017-07-09 DIAGNOSIS — R2681 Unsteadiness on feet: Secondary | ICD-10-CM

## 2017-07-09 NOTE — Therapy (Signed)
[place], [place], [address] Phone: [phone]   Fax:  [phone]  Physical Therapy Treatment  Patient Details  Name: [name] MRN: [id number] Date of Birth: [birth year] Referring Provider: Jenna Luo, MD  Encounter Date: 07/09/2017      PT End of Session - 07/09/17 1042    Visit Number 2   Number of Visits 13   Date for PT Re-Evaluation 07/26/17   Authorization Type [employer]   Authorization Time Period 07/05/17 to 08/16/17   Authorization - Visit Number 2   Authorization - Number of Visits 10   PT Start Time 6962   PT Stop Time 1123   PT Time Calculation (min) 43 min   Equipment Utilized During Treatment Gait belt   Activity Tolerance Patient tolerated treatment well;No increased pain   Behavior During Therapy WFL for tasks assessed/performed      Past Medical History:  Diagnosis Date  . Adenocarcinoma of lung ([place]) 12/2010   left lung/surg only  . Allergic rhinitis   . 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 Anxiety disorder   . Aortic aneurysm ([place])   . Complication of anesthesia 2011   bloodpressure dropped during colonoscopy, not problems since  . Compression fracture   . COPD (chronic obstructive pulmonary disease) ([place])   . Depression   . Diastolic dysfunction   . Diverticulosis   . Dysphagia   . Emphysema lung ([place]) 11/08/2014  . Hiatal hernia   . Hypothyroidism   . IBS (irritable bowel syndrome)   . Iron deficiency anemia due to chronic blood loss 12/05/2016  . On home O2 12/15/2013   chronic hypoxia  . PUD (peptic ulcer disease)    38yrs  . Shortness of breath    with exertion  . SIADH (syndrome of inappropriate ADH production) ([place])   . Trigeminal neuralgia     Past Surgical History:  Procedure Laterality Date  . ABDOMINAL HYSTERECTOMY    . BACK SURGERY  January 13, 2014  . CATARACT EXTRACTION    . CATARACT EXTRACTION W/PHACO   09/02/2012   Procedure: CATARACT EXTRACTION PHACO AND INTRAOCULAR LENS PLACEMENT (IOC);  Surgeon: Elta Guadeloupe T. Gershon Crane, MD;  Location: [facility];  Service: Ophthalmology;  Laterality: Left;  CDE:10.35  . CHOLECYSTECTOMY    . 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 COLONOSCOPY N/A 11/23/2014   Procedure: COLONOSCOPY;  Surgeon: Danie Binder, MD;  [facility];  Service: Endoscopy;  Laterality: N/A;  930   . ENDARTERECTOMY Right 08/21/2013   Procedure: RIGHT CAROTID ANEURYSM RESECTION;  Surgeon: Rosetta Posner, MD;  Location: [facility] OR;  Service: Vascular;  Laterality: Right;  . ESOPHAGOGASTRODUODENOSCOPY  11/13/2009   w/dilation to 41mm, gastric ulceration (H.Pylori) s/p treatment  . ESOPHAGOGASTRODUODENOSCOPY  11/21/2009   distal esophageal web, gastritis  . ESOPHAGOGASTRODUODENOSCOPY  03/14/12   XBM:[id number] in the distal esophagus/Mild gastritis/small HH. + H.pylori, prescribed Pylera. Finished treatment.   . fatty tumor removal from lt groin    . FRACTURE SURGERY Left    hand and left ring finger  . LUNG CANCER SURGERY  12/2010   Left VATS, minithoracotomy, LLL superior segmentectomy  . ORIF WRIST FRACTURE Left 04/09/2014   Procedure: OPEN REDUCTION INTERNAL FIXATION (ORIF) WRIST FRACTURE;  Surgeon: Renette Butters, MD;  Location: [facility];  Service: Orthopedics;  Laterality: Left;  . PERCUTANEOUS PINNING Left 04/09/2014  Procedure: PERCUTANEOUS PINNING EXTREMITY;  Surgeon: Renette Butters, MD;  Location: [facility];  Service: Orthopedics;  Laterality: Left;  . TUBAL LIGATION    . vocal cord surgery  02/06/2011   laryngoscopy with bilateral vocal cord Radiesse injection for vocal cord paralysis  . YAG LASER APPLICATION Left 14/43/1540   Procedure: YAG LASER APPLICATION;  Surgeon: Rutherford Guys, MD;  [facility]: [facility];  Service: Ophthalmology;  Laterality: Left;    There were no vitals filed for this visit.      Subjective Assessment - 07/09/17 1040     Subjective Pt stated she got spasm on Rt side lower back while putting on shoes today, no reports of pain just tightness and weakness.     Patient Stated Goals walk without a RW and get over fear of falling   Currently in Pain? Yes   Pain Score 1    Pain Location Back   Pain Orientation Lower;Right   Pain Descriptors / Indicators Dull;Tightness   Pain Onset More than a month ago   Pain Frequency Intermittent   Aggravating Factors  walking, standing, sitting for too long   Pain Relieving Factors laying flat    Effect of Pain on Daily Activities increases            [facility] PT Assessment - 07/09/17 0001      6 Minute Walk- Baseline   6 Minute Walk- Baseline yes  678 ft with RW, no rest breaks or LOB noted     Standardized Balance Assessment   Standardized Balance Assessment Berg Balance Test     Berg Balance Test   Sit to Stand Able to stand without using hands and stabilize independently  very cautious   Standing Unsupported Able to stand safely 2 minutes   Sitting with Back Unsupported but Feet Supported on Floor or Stool Able to sit safely and securely 2 minutes   Stand to Sit Sits safely with minimal use of hands  able to complete, cautious, fear of falling   Transfers Able to transfer safely, minor use of hands  able to complete, cautious, fear of falling   Standing Unsupported with Eyes Closed Able to stand 10 seconds safely   Standing Ubsupported with Feet Together Able to place feet together independently and stand 1 minute safely   From Standing, Reach Forward with Outstretched Arm Can reach forward >12 cm safely (5")   From Standing Position, Pick up Object from Floor Able to pick up shoe safely and easily   From Standing Position, Turn to Look Behind Over each Shoulder Turn sideways only but maintains balance   Turn 360 Degrees Able to turn 360 degrees safely but slowly   Standing Unsupported, Alternately Place Feet on Step/Stool Able to complete 4 steps without aid  or supervision  24.15"   Standing Unsupported, One Foot in Front Able to plae foot ahead of the other independently and hold 30 seconds  cautious   Standing on One Leg Tries to lift leg/unable to hold 3 seconds but remains standing independently  Lt 5' Rt 3"   Total Score 45                     [facility] Adult PT Treatment/Exercise - 07/09/17 0001      Exercises   Exercises Lumbar     Lumbar Exercises: Seated   Sit to Stand 5 reps   Sit to Stand Limitations no HHA, eccentric control   Other Seated Lumbar Exercises lumbar  roll support while sitting during session   Other Seated Lumbar Exercises scapular retraciton wiht RTB 10x                  PT Short Term Goals - 07/05/17 1215      PT SHORT TERM GOAL #1   Title Pt will be independent with HEP and perform consistently in order to maximize overall function.    Time 3   Period Weeks   Status New   Target Date 07/26/17     PT SHORT TERM GOAL #2   Title Pt will have at least 4+/5 MMT of all muscle groups tested in order to maximize gait and decrease risk for falls.   Time 3   Period Weeks   Status New     PT SHORT TERM GOAL #3   Title Pt will be able to demonstrate proper setup and use of lumbar roll while sitting to maximize posture and decrease pain.   Time 3   Period Weeks   Status New           PT Long Term Goals - 07/05/17 1216      PT LONG TERM GOAL #1   Title Pt will report an improved tolerance to standing and walking by at least 50% (to at least 30 mins standing and 10 mins walking) to maximize her ability to perform chores at home.    Time 6   Period Weeks   Status New   Target Date 08/16/17     PT LONG TERM GOAL #2   Title Pt will be able to perform SLS on BLE for at least 10 sec to maximize gait and balance.    Time 6   Period Weeks   Status New     PT LONG TERM GOAL #3   Title Pt will have an improved 6MWT by 13ft or > and with LRAD to demo improve overall tolerance to  movement in order to maximize participation at home.    Time 6   Period Weeks   Status New     PT LONG TERM GOAL #4   Title Pt will score 52/56 on BERG to demo improved balance and decrease risk for falls.    Time 6   Period Weeks   Status New               Plan - 07/09/17 1247    Clinical Impression Statement Reviewed goals, assured complaince and copy of eval given to pt.  Pt educated on importance of awareness of posture and compliance with lumbar roll to assist, verballized understanding.  6MWT complete with ability to ambulate 678 feet with RW, no LOB episodes noted though does present with postural weakness with fatigue.  BERG balance assessed with score of 45/52.  Pt presents with increased risk of fall during BERG and any standing NBOS activities.  Min A for safety thorugh session.  No reports of pain, was limited by fatigue at EOS.     Rehab Potential Fair   PT Duration 6 weeks   PT Treatment/Interventions ADLs/Self Care Home Management;Cryotherapy;Electrical Stimulation;Moist Heat;DME Instruction;Gait training;Stair training;Functional mobility training;Therapeutic activities;Therapeutic exercise;Balance training;Neuromuscular re-education;Patient/family education;Manual techniques;Passive range of motion;Dry needling;Energy conservation;Taping   PT Next Visit Plan avoid flexion as pt has compression fracture; Next session begin decompression 1-5, functional BLE strengthening, postural strengthening, and balance activities.   PT Home Exercise Plan eval: standing scapular retraction and sitting with lumbar roll      Patient will  benefit from skilled therapeutic intervention in order to improve the following deficits and impairments:  Cardiopulmonary status limiting activity, Decreased activity tolerance, Decreased balance, Decreased endurance, Decreased mobility, Decreased range of motion, Decreased strength, Hypomobility, Increased muscle spasms, Impaired flexibility, Improper  body mechanics, Postural dysfunction, Pain  Visit Diagnosis: Pain in thoracic spine  Abnormal posture  Muscle weakness (generalized)  Unsteadiness on feet     Problem List Patient Active Problem List   Diagnosis Date Noted  . Iron deficiency anemia 12/05/2016  . SIADH (syndrome of inappropriate ADH production) ([place])   . Polypharmacy 09/14/2016  . Muscle weakness (generalized)   . Syncope 09/10/2016  . Rib pain on left side   . Hypersomnia 02/16/2016  . Acute bronchitis 02/16/2016  . Exertional dyspnea 12/20/2015  . HLD (hyperlipidemia) 11/23/2014  . Change in bowel habits 11/10/2014  . Abdominal pain, left lower quadrant 11/10/2014  . Emphysema lung ([place]) 11/08/2014  . Helicobacter pylori gastritis 07/30/2014  . Acute on chronic respiratory failure with hypoxia ([place]) 04/10/2014  . Wrist fracture 04/09/2014  . 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 T12 burst fracture ([place]) 01/13/2014  . Constipation 12/14/2013  . Acute respiratory failure with hypoxia ([place]) 12/14/2013  . Hyponatremia 12/14/2013  . COPD (chronic obstructive pulmonary disease) ([place]) 12/14/2013  . Compression fracture of thoracic vertebra ([place]) 12/14/2013  . Hypoxia 12/13/2013  . Compression fracture 12/13/2013  . Diastolic dysfunction   . 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 Dyspepsia 02/11/2012  . Aneurysm ([place]) 02/11/2012  . Trigeminal neuralgia   . Epigastric pain 02/21/2011  . Esophageal dysphagia 02/21/2011  . Adenocarcinoma of lung ([place]) 12/07/2010  . CONTRAST DYE ALLERGY 12/30/2009   Ihor Austin, LPTA; [place]  Aldona Lento 07/09/2017, 12:54 PM  [facility] [place], [place], [address] Phone: [phone]   Fax:  [phone]  Name: KAILEA DANNEMILLER MRN: [id number] Date of Birth: [birth year]

## 2017-07-11 ENCOUNTER — Ambulatory Visit (HOSPITAL_COMMUNITY): Payer: PPO

## 2017-07-11 DIAGNOSIS — M546 Pain in thoracic spine: Secondary | ICD-10-CM

## 2017-07-11 DIAGNOSIS — M6281 Muscle weakness (generalized): Secondary | ICD-10-CM

## 2017-07-11 DIAGNOSIS — R293 Abnormal posture: Secondary | ICD-10-CM

## 2017-07-11 DIAGNOSIS — R2681 Unsteadiness on feet: Secondary | ICD-10-CM

## 2017-07-11 NOTE — Therapy (Signed)
[place], [place], [address] Phone: [phone]   Fax:  [phone]  Physical Therapy Treatment  Patient Details  Name: [name] MRN: [id number] Date of Birth: October 07, 1941 Referring Provider: Jenna Luo, MD  Encounter Date: 07/11/2017      PT End of Session - 07/11/17 1029    Visit Number 3   Number of Visits 13   Date for PT Re-Evaluation 07/26/17   Authorization Type [employer]   Authorization Time Period 07/05/17 to 08/16/17   Authorization - Visit Number 3   Authorization - Number of Visits 10   PT Start Time 1030   PT Stop Time 1112   PT Time Calculation (min) 42 min   Equipment Utilized During Treatment Gait belt   Activity Tolerance Patient tolerated treatment well;No increased pain   Behavior During Therapy WFL for tasks assessed/performed      Past Medical History:  Diagnosis Date  . 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 Allergic rhinitis   . 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 Anxiety disorder   . Aortic aneurysm ([place])   . Complication of anesthesia 2011   bloodpressure dropped during colonoscopy, not problems since  . Compression fracture   . COPD (chronic obstructive pulmonary disease) ([place])   . Depression   . Diastolic dysfunction   . Diverticulosis   . Dysphagia   . Emphysema lung ([place]) 11/08/2014  . Hiatal hernia   . Hypothyroidism   . IBS (irritable bowel syndrome)   . Iron deficiency anemia due to chronic blood loss 12/05/2016  . On home O2 12/15/2013   chronic hypoxia  . PUD (peptic ulcer disease)    29yrs  . Shortness of breath    with exertion  . SIADH (syndrome of inappropriate ADH production) ([place])   . Trigeminal neuralgia     Past Surgical History:  Procedure Laterality Date  . ABDOMINAL HYSTERECTOMY    . BACK SURGERY  January 13, 2014  . CATARACT EXTRACTION    . CATARACT EXTRACTION W/PHACO   09/02/2012   Procedure: CATARACT EXTRACTION PHACO AND INTRAOCULAR LENS PLACEMENT (IOC);  Surgeon: Elta Guadeloupe T. Gershon Crane, MD;  [facility];  Service: Ophthalmology;  Laterality: Left;  CDE:10.35  . CHOLECYSTECTOMY    . 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 COLONOSCOPY N/A 11/23/2014   Procedure: COLONOSCOPY;  Surgeon: Danie Binder, MD;  [facility];  Service: Endoscopy;  Laterality: N/A;  930   . ENDARTERECTOMY Right 08/21/2013   Procedure: RIGHT CAROTID ANEURYSM RESECTION;  Surgeon: Rosetta Posner, MD;  Location: [facility] OR;  Service: Vascular;  Laterality: Right;  . ESOPHAGOGASTRODUODENOSCOPY  11/13/2009   w/dilation to 61mm, gastric ulceration (H.Pylori) s/p treatment  . ESOPHAGOGASTRODUODENOSCOPY  11/21/2009   distal esophageal web, gastritis  . ESOPHAGOGASTRODUODENOSCOPY  03/14/12   EHU:[id number] in the distal esophagus/Mild gastritis/small HH. + H.pylori, prescribed Pylera. Finished treatment.   . fatty tumor removal from lt groin    . FRACTURE SURGERY Left    hand and left ring finger  . LUNG CANCER SURGERY  12/2010   Left VATS, minithoracotomy, LLL superior segmentectomy  . ORIF WRIST FRACTURE Left 04/09/2014   Procedure: OPEN REDUCTION INTERNAL FIXATION (ORIF) WRIST FRACTURE;  Surgeon: Renette Butters, MD;  Location: [facility];  Service: Orthopedics;  Laterality: Left;  . PERCUTANEOUS PINNING Left 04/09/2014  Procedure: PERCUTANEOUS PINNING EXTREMITY;  Surgeon: Renette Butters, MD;  Location: [facility];  Service: Orthopedics;  Laterality: Left;  . TUBAL LIGATION    . vocal cord surgery  02/06/2011   laryngoscopy with bilateral vocal cord Radiesse injection for vocal cord paralysis  . YAG LASER APPLICATION Left 31/54/0086   Procedure: YAG LASER APPLICATION;  Surgeon: Rutherford Guys, MD;  [facility];  Service: Ophthalmology;  Laterality: Left;    There were no vitals filed for this visit.      Subjective Assessment - 07/11/17 1030     Subjective Pt states that she is having some low back pain this morning. She reports she felt good following last session.    Patient Stated Goals walk without a RW and get over fear of falling   Currently in Pain? Yes   Pain Score 2    Pain Location Back   Pain Orientation Lower;Right   Pain Descriptors / Indicators Spasm   Pain Type Chronic pain   Pain Onset More than a month ago   Pain Frequency Intermittent   Aggravating Factors  walking, standing, sitting for too long   Pain Relieving Factors laying flat   Effect of Pain on Daily Activities increases            [facility] PT Assessment - 07/11/17 0001      ROM / Strength   AROM / PROM / Strength PROM     PROM   Overall PROM Comments assessed in supine   Right Hip External Rotation  --  WNL   Right Hip Internal Rotation  --  ~20-25 deg, mild reproduction of symptoms   Left Hip External Rotation  --  WNL   Left Hip Internal Rotation  --  WNL             [facility] Adult PT Treatment/Exercise - 07/11/17 0001      Lumbar Exercises: Standing   Other Standing Lumbar Exercises UE flexion on wall 2x10   Other Standing Lumbar Exercises bil staggered stance and UE flexion x10 reps each     Lumbar Exercises: Supine   Bridge 10 reps   Bridge Limitations 2 sets; glute max bridge   Other Supine Lumbar Exercises decompression exercises 1-5 x10 reps each     Lumbar Exercises: Sidelying   Clam 10 reps   Clam Limitations 2 sets, RTB     Manual Therapy   Manual Therapy Joint mobilization   Manual therapy comments completed separate rest of treatment   Joint Mobilization R manual hip distraction 10x10" bouts               PT Education - 07/11/17 1113    Education provided Yes   Education Details added decompression 1-5, bridging, sidelying clams to HEP   Person(s) Educated Patient   Methods Explanation;Demonstration;Handout   Comprehension Verbalized understanding;Returned demonstration          PT Short Term  Goals - 07/05/17 1215      PT SHORT TERM GOAL #1   Title Pt will be independent with HEP and perform consistently in order to maximize overall function.    Time 3   Period Weeks   Status New   Target Date 07/26/17     PT SHORT TERM GOAL #2   Title Pt will have at least 4+/5 MMT of all muscle groups tested in order to maximize gait and decrease risk for falls.   Time 3   Period Weeks   Status  New     PT SHORT TERM GOAL #3   Title Pt will be able to demonstrate proper setup and use of lumbar roll while sitting to maximize posture and decrease pain.   Time 3   Period Weeks   Status New           PT Long Term Goals - 07/05/17 1216      PT LONG TERM GOAL #1   Title Pt will report an improved tolerance to standing and walking by at least 50% (to at least 30 mins standing and 10 mins walking) to maximize her ability to perform chores at home.    Time 6   Period Weeks   Status New   Target Date 08/16/17     PT LONG TERM GOAL #2   Title Pt will be able to perform SLS on BLE for at least 10 sec to maximize gait and balance.    Time 6   Period Weeks   Status New     PT LONG TERM GOAL #3   Title Pt will have an improved 6MWT by 190ft or > and with LRAD to demo improve overall tolerance to movement in order to maximize participation at home.    Time 6   Period Weeks   Status New     PT LONG TERM GOAL #4   Title Pt will score 52/56 on BERG to demo improved balance and decrease risk for falls.    Time 6   Period Weeks   Status New               Plan - 07/11/17 1114    Clinical Impression Statement Introduced pt to decompression exercises 1-5 this date with great tolerance; added to HEP. Assessed pt's R hip which was slightly limited in IR and reproduced her symptoms slightly. Performed manual hip distraction to address this with good response. BLE strengthening performed to end session. Continue POC as planned.   Rehab Potential Fair   PT Duration 6 weeks   PT  Treatment/Interventions ADLs/Self Care Home Management;Cryotherapy;Electrical Stimulation;Moist Heat;DME Instruction;Gait training;Stair training;Functional mobility training;Therapeutic activities;Therapeutic exercise;Balance training;Neuromuscular re-education;Patient/family education;Manual techniques;Passive range of motion;Dry needling;Energy conservation;Taping   PT Next Visit Plan avoid flexion as pt has compression continue functional BLE strengthening, postural strengthening, and balance activities.   PT Home Exercise Plan eval: standing scapular retraction and sitting with lumbar roll; 10/4: decompression 1-5, bridging, sidelying clams   Consulted and Agree with Plan of Care Patient      Patient will benefit from skilled therapeutic intervention in order to improve the following deficits and impairments:  Cardiopulmonary status limiting activity, Decreased activity tolerance, Decreased balance, Decreased endurance, Decreased mobility, Decreased range of motion, Decreased strength, Hypomobility, Increased muscle spasms, Impaired flexibility, Improper body mechanics, Postural dysfunction, Pain  Visit Diagnosis: Pain in thoracic spine  Abnormal posture  Muscle weakness (generalized)  Unsteadiness on feet     Problem List Patient Active Problem List   Diagnosis Date Noted  . Iron deficiency anemia 12/05/2016  . SIADH (syndrome of inappropriate ADH production) ([place])   . Polypharmacy 09/14/2016  . Muscle weakness (generalized)   . Syncope 09/10/2016  . Rib pain on left side   . Hypersomnia 02/16/2016  . Acute bronchitis 02/16/2016  . Exertional dyspnea 12/20/2015  . HLD (hyperlipidemia) 11/23/2014  . Change in bowel habits 11/10/2014  . Abdominal pain, left lower quadrant 11/10/2014  . Emphysema lung ([place]) 11/08/2014  . Helicobacter pylori gastritis 07/30/2014  . Acute on  chronic respiratory failure with hypoxia ([place]) 04/10/2014  . Wrist fracture 04/09/2014  . Occlusion  and stenosis of carotid artery without mention of cerebral infarction 03/16/2014  . T12 burst fracture ([place]) 01/13/2014  . Constipation 12/14/2013  . Acute respiratory failure with hypoxia ([place]) 12/14/2013  . Hyponatremia 12/14/2013  . COPD (chronic obstructive pulmonary disease) ([place]) 12/14/2013  . Compression fracture of thoracic vertebra ([place]) 12/14/2013  . Hypoxia 12/13/2013  . Compression fracture 12/13/2013  . Diastolic dysfunction   . 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 Dyspepsia 02/11/2012  . Aneurysm ([place]) 02/11/2012  . Trigeminal neuralgia   . Epigastric pain 02/21/2011  . Esophageal dysphagia 02/21/2011  . Adenocarcinoma of lung ([place]) 12/07/2010  . CONTRAST DYE ALLERGY 12/30/2009        Geraldine Solar PT, DPT  [facility] [address] [place], [place], [address] Phone: [phone]   Fax:  [phone]  Name: LAEL WETHERBEE MRN: [id number] Date of Birth: 1940/10/17

## 2017-07-11 NOTE — Patient Instructions (Signed)
  BRIDGING  While lying on your back, tighten your lower abdominals, squeeze your buttocks and then raise your buttocks off the floor/bed as creating a "Bridge" with your body. Hold and then lower yourself and repeat.  Perform 1x/day, 2-3 sets of 10 reps   ELASTIC BAND - SIDELYING CLAM - [place]   While lying on your side with your knees bent and an elastic band wrapped around your knees, draw up the top knee while keeping contact of your feet together as shown.   Do not let your pelvis roll back during the lifting movement.    Perform 1x/day, 2-3 sets of 10 reps each side with RTB

## 2017-07-16 ENCOUNTER — Ambulatory Visit (HOSPITAL_COMMUNITY): Payer: PPO | Admitting: Physical Therapy

## 2017-07-16 ENCOUNTER — Telehealth (HOSPITAL_COMMUNITY): Payer: Self-pay

## 2017-07-16 DIAGNOSIS — R2681 Unsteadiness on feet: Secondary | ICD-10-CM

## 2017-07-16 DIAGNOSIS — R293 Abnormal posture: Secondary | ICD-10-CM

## 2017-07-16 DIAGNOSIS — M546 Pain in thoracic spine: Secondary | ICD-10-CM

## 2017-07-16 DIAGNOSIS — M6281 Muscle weakness (generalized): Secondary | ICD-10-CM

## 2017-07-16 NOTE — Therapy (Signed)
[place], [place], [address] Phone: [phone]   Fax:  [phone]  Physical Therapy Treatment  Patient Details  Name: [name] MRN: [id number] Date of Birth: [birth year] Referring Provider: Jenna Luo, MD  Encounter Date: 07/16/2017      PT End of Session - 07/16/17 1103    Visit Number 4   Number of Visits 13   Date for PT Re-Evaluation 07/26/17   Authorization Type [employer]   Authorization Time Period 07/05/17 to 08/16/17   Authorization - Visit Number 4   Authorization - Number of Visits 10   PT Start Time 4166   PT Stop Time 1120   PT Time Calculation (min) 45 min   Equipment Utilized During Treatment Gait belt   Activity Tolerance Patient tolerated treatment well;No increased pain   Behavior During Therapy WFL for tasks assessed/performed      Past Medical History:  Diagnosis Date  . Adenocarcinoma of lung ([place]) 12/2010   left lung/surg only  . Allergic rhinitis   . 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 Anxiety disorder   . Aortic aneurysm ([place])   . Complication of anesthesia 2011   bloodpressure dropped during colonoscopy, not problems since  . Compression fracture   . COPD (chronic obstructive pulmonary disease) ([place])   . Depression   . Diastolic dysfunction   . Diverticulosis   . Dysphagia   . Emphysema lung ([place]) 11/08/2014  . Hiatal hernia   . Hypothyroidism   . IBS (irritable bowel syndrome)   . Iron deficiency anemia due to chronic blood loss 12/05/2016  . On home O2 12/15/2013   chronic hypoxia  . PUD (peptic ulcer disease)    80yrs  . Shortness of breath    with exertion  . SIADH (syndrome of inappropriate ADH production) ([place])   . Trigeminal neuralgia     Past Surgical History:  Procedure Laterality Date  . ABDOMINAL HYSTERECTOMY    . BACK SURGERY  January 13, 2014  . CATARACT EXTRACTION    . CATARACT EXTRACTION W/PHACO   09/02/2012   Procedure: CATARACT EXTRACTION PHACO AND INTRAOCULAR LENS PLACEMENT (IOC);  Surgeon: Elta Guadeloupe T. Gershon Crane, MD;  Location: [facility];  Service: Ophthalmology;  Laterality: Left;  CDE:10.35  . CHOLECYSTECTOMY    . 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 COLONOSCOPY N/A 11/23/2014   Procedure: COLONOSCOPY;  Surgeon: Danie Binder, MD;  [facility];  Service: Endoscopy;  Laterality: N/A;  930   . ENDARTERECTOMY Right 08/21/2013   Procedure: RIGHT CAROTID ANEURYSM RESECTION;  Surgeon: Rosetta Posner, MD;  Location: [facility] OR;  Service: Vascular;  Laterality: Right;  . ESOPHAGOGASTRODUODENOSCOPY  11/13/2009   w/dilation to 35mm, gastric ulceration (H.Pylori) s/p treatment  . ESOPHAGOGASTRODUODENOSCOPY  11/21/2009   distal esophageal web, gastritis  . ESOPHAGOGASTRODUODENOSCOPY  03/14/12   [id number] in the distal esophagus/Mild gastritis/small HH. + H.pylori, prescribed Pylera. Finished treatment.   . fatty tumor removal from lt groin    . FRACTURE SURGERY Left    hand and left ring finger  . LUNG CANCER SURGERY  12/2010   Left VATS, minithoracotomy, LLL superior segmentectomy  . ORIF WRIST FRACTURE Left 04/09/2014   Procedure: OPEN REDUCTION INTERNAL FIXATION (ORIF) WRIST FRACTURE;  Surgeon: Renette Butters, MD;  Location: [facility];  Service: Orthopedics;  Laterality: Left;  . PERCUTANEOUS PINNING Left 04/09/2014  Procedure: PERCUTANEOUS PINNING EXTREMITY;  Surgeon: Renette Butters, MD;  Location: [facility];  Service: Orthopedics;  Laterality: Left;  . TUBAL LIGATION    . vocal cord surgery  02/06/2011   laryngoscopy with bilateral vocal cord Radiesse injection for vocal cord paralysis  . YAG LASER APPLICATION Left 72/53/6644   Procedure: YAG LASER APPLICATION;  Surgeon: Rutherford Guys, MD;  Location: [facility];  Service: Ophthalmology;  Laterality: Left;    There were no vitals filed for this visit.      Subjective Assessment - 07/16/17 1048     Subjective Pt states she's not really in pain she is more in discomfort but it is much better than before.  She could acturally cook at the stove this weekend.     Limitations Standing;House hold activities;Walking   How long can you sit comfortably? not long   How long can you stand comfortably? 15 mins or <   How long can you walk comfortably? 5 mins with RW   Patient Stated Goals walk without a RW and get over fear of falling   Currently in Pain? No/denies   Pain Onset More than a month ago                         [facility] Adult PT Treatment/Exercise - 07/16/17 0001      Lumbar Exercises: Standing   Other Standing Lumbar Exercises UE flexion on wall 10   Other Standing Lumbar Exercises bil staggered stance and UE flexion x10 reps each     Lumbar Exercises: Seated   Other Seated Lumbar Exercises scapular and cervical retraction x 10; feet push into floor raise up in tall posture x 5      Lumbar Exercises: Supine   Bridge 10 reps   Other Supine Lumbar Exercises decompression exercises 1-5 x10 reps each   Other Supine Lumbar Exercises t-band decompression exercisses      Manual Therapy   Manual Therapy --   Manual therapy comments --   Joint Mobilization --                  PT Short Term Goals - 07/05/17 1215      PT SHORT TERM GOAL #1   Title Pt will be independent with HEP and perform consistently in order to maximize overall function.    Time 3   Period Weeks   Status New   Target Date 07/26/17     PT SHORT TERM GOAL #2   Title Pt will have at least 4+/5 MMT of all muscle groups tested in order to maximize gait and decrease risk for falls.   Time 3   Period Weeks   Status New     PT SHORT TERM GOAL #3   Title Pt will be able to demonstrate proper setup and use of lumbar roll while sitting to maximize posture and decrease pain.   Time 3   Period Weeks   Status New           PT Long Term Goals - 07/05/17 1216      PT LONG TERM GOAL  #1   Title Pt will report an improved tolerance to standing and walking by at least 50% (to at least 30 mins standing and 10 mins walking) to maximize her ability to perform chores at home.    Time 6   Period Weeks   Status New   Target Date 08/16/17  PT LONG TERM GOAL #2   Title Pt will be able to perform SLS on BLE for at least 10 sec to maximize gait and balance.    Time 6   Period Weeks   Status New     PT LONG TERM GOAL #3   Title Pt will have an improved 6MWT by 182ft or > and with LRAD to demo improve overall tolerance to movement in order to maximize participation at home.    Time 6   Period Weeks   Status New     PT LONG TERM GOAL #4   Title Pt will score 52/56 on BERG to demo improved balance and decrease risk for falls.    Time 6   Period Weeks   Status New               Plan - 07/16/17 1104    Clinical Impression Statement Added decompression exercises with t-band exercises and reviewed previous exercises.  Worked on pt confidence standing without assistive device.     Rehab Potential Fair   PT Duration 6 weeks   PT Treatment/Interventions ADLs/Self Care Home Management;Cryotherapy;Electrical Stimulation;Moist Heat;DME Instruction;Gait training;Stair training;Functional mobility training;Therapeutic activities;Therapeutic exercise;Balance training;Neuromuscular re-education;Patient/family education;Manual techniques;Passive range of motion;Dry needling;Energy conservation;Taping   PT Next Visit Plan avoid flexion as pt has compression continue functional BLE strengthening, postural strengthening, and balance activities.   PT Home Exercise Plan eval: standing scapular retraction and sitting with lumbar roll; 10/4: decompression 1-5, bridging, sidelying clams   Consulted and Agree with Plan of Care Patient      Patient will benefit from skilled therapeutic intervention in order to improve the following deficits and impairments:  Cardiopulmonary status  limiting activity, Decreased activity tolerance, Decreased balance, Decreased endurance, Decreased mobility, Decreased range of motion, Decreased strength, Hypomobility, Increased muscle spasms, Impaired flexibility, Improper body mechanics, Postural dysfunction, Pain  Visit Diagnosis: Pain in thoracic spine  Abnormal posture  Muscle weakness (generalized)  Unsteadiness on feet     Problem List Patient Active Problem List   Diagnosis Date Noted  . Iron deficiency anemia 12/05/2016  . SIADH (syndrome of inappropriate ADH production) ([place])   . Polypharmacy 09/14/2016  . Muscle weakness (generalized)   . Syncope 09/10/2016  . Rib pain on left side   . Hypersomnia 02/16/2016  . Acute bronchitis 02/16/2016  . Exertional dyspnea 12/20/2015  . HLD (hyperlipidemia) 11/23/2014  . Change in bowel habits 11/10/2014  . Abdominal pain, left lower quadrant 11/10/2014  . Emphysema lung ([place]) 11/08/2014  . Helicobacter pylori gastritis 07/30/2014  . Acute on chronic respiratory failure with hypoxia ([place]) 04/10/2014  . Wrist fracture 04/09/2014  . 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 T12 burst fracture ([place]) 01/13/2014  . Constipation 12/14/2013  . Acute respiratory failure with hypoxia ([place]) 12/14/2013  . Hyponatremia 12/14/2013  . COPD (chronic obstructive pulmonary disease) ([place]) 12/14/2013  . Compression fracture of thoracic vertebra ([place]) 12/14/2013  . Hypoxia 12/13/2013  . Compression fracture 12/13/2013  . Diastolic dysfunction   . 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 Dyspepsia 02/11/2012  . Aneurysm ([place]) 02/11/2012  . Trigeminal neuralgia   . Epigastric pain 02/21/2011  . Esophageal dysphagia 02/21/2011  . Adenocarcinoma of lung ([place]) 12/07/2010  . CONTRAST DYE ALLERGY 12/30/2009    Rayetta Humphrey, PT CLT [phone] 07/16/2017, 11:24 AM  [facility] [address]  [place], [place], [address] Phone: [phone]   Fax:  [phone]  Name: [name] MRN: [id number] Date of Birth: 11-28-1940

## 2017-07-16 NOTE — Telephone Encounter (Signed)
Patient came on the wrong date and will be seen on today

## 2017-07-17 ENCOUNTER — Encounter (HOSPITAL_COMMUNITY): Payer: PPO

## 2017-07-19 ENCOUNTER — Ambulatory Visit (HOSPITAL_COMMUNITY): Payer: PPO

## 2017-07-19 NOTE — Telephone Encounter (Signed)
Spoke to patient this morning to confirm clinic opening. Patient stated that transportation services were not happening today and she is unable to make her appointment. Patient wishes to reschedule.  [place] PT, DPT 9:14 AM, 07/19/17 [phone]

## 2017-07-23 ENCOUNTER — Other Ambulatory Visit: Payer: Self-pay | Admitting: Family Medicine

## 2017-07-23 ENCOUNTER — Encounter (HOSPITAL_COMMUNITY): Payer: Self-pay

## 2017-07-23 ENCOUNTER — Ambulatory Visit (HOSPITAL_COMMUNITY): Payer: PPO

## 2017-07-23 DIAGNOSIS — Z1231 Encounter for screening mammogram for malignant neoplasm of breast: Secondary | ICD-10-CM

## 2017-07-23 DIAGNOSIS — R293 Abnormal posture: Secondary | ICD-10-CM

## 2017-07-23 DIAGNOSIS — M6281 Muscle weakness (generalized): Secondary | ICD-10-CM

## 2017-07-23 DIAGNOSIS — R2681 Unsteadiness on feet: Secondary | ICD-10-CM

## 2017-07-23 DIAGNOSIS — M546 Pain in thoracic spine: Secondary | ICD-10-CM | POA: Diagnosis not present

## 2017-07-23 NOTE — Therapy (Signed)
[place], [place], [address] Phone: [phone]   Fax:  [phone]  Physical Therapy Treatment  Patient Details  Name: [name] MRN: [id number] Date of Birth: March 30, 1941 Referring Provider: Jenna Luo, MD  Encounter Date: 07/23/2017      PT End of Session - 07/23/17 1131    Visit Number 5   Number of Visits 13   Date for PT Re-Evaluation 07/26/17   Authorization Type [employer]   Authorization Time Period 07/05/17 to 08/16/17   Authorization - Visit Number 5   Authorization - Number of Visits 10   PT Start Time 8127   PT Stop Time 1115   PT Time Calculation (min) 40 min   Equipment Utilized During Treatment Gait belt   Activity Tolerance Patient tolerated treatment well;No increased pain   Behavior During Therapy WFL for tasks assessed/performed      Past Medical History:  Diagnosis Date  . 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 Allergic rhinitis   . 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 Anxiety disorder   . Aortic aneurysm ([place])   . Complication of anesthesia 2011   bloodpressure dropped during colonoscopy, not problems since  . Compression fracture   . COPD (chronic obstructive pulmonary disease) ([place])   . Depression   . Diastolic dysfunction   . Diverticulosis   . Dysphagia   . Emphysema lung ([place]) 11/08/2014  . Hiatal hernia   . Hypothyroidism   . IBS (irritable bowel syndrome)   . Iron deficiency anemia due to chronic blood loss 12/05/2016  . On home O2 12/15/2013   chronic hypoxia  . PUD (peptic ulcer disease)    55yrs  . Shortness of breath    with exertion  . SIADH (syndrome of inappropriate ADH production) ([place])   . Trigeminal neuralgia     Past Surgical History:  Procedure Laterality Date  . ABDOMINAL HYSTERECTOMY    . BACK SURGERY  January 13, 2014  . CATARACT EXTRACTION    . CATARACT EXTRACTION W/PHACO   09/02/2012   Procedure: CATARACT EXTRACTION PHACO AND INTRAOCULAR LENS PLACEMENT (IOC);  Surgeon: Elta Guadeloupe T. Gershon Crane, MD;  Location: [facility];  Service: Ophthalmology;  Laterality: Left;  CDE:10.35  . CHOLECYSTECTOMY    . 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 COLONOSCOPY N/A 11/23/2014   Procedure: COLONOSCOPY;  Surgeon: Danie Binder, MD;  [facility];  Service: Endoscopy;  Laterality: N/A;  930   . ENDARTERECTOMY Right 08/21/2013   Procedure: RIGHT CAROTID ANEURYSM RESECTION;  Surgeon: Rosetta Posner, MD;  Location: [facility] OR;  Service: Vascular;  Laterality: Right;  . ESOPHAGOGASTRODUODENOSCOPY  11/13/2009   w/dilation to 85mm, gastric ulceration (H.Pylori) s/p treatment  . ESOPHAGOGASTRODUODENOSCOPY  11/21/2009   distal esophageal web, gastritis  . ESOPHAGOGASTRODUODENOSCOPY  03/14/12   [id number] in the distal esophagus/Mild gastritis/small HH. + H.pylori, prescribed Pylera. Finished treatment.   . fatty tumor removal from lt groin    . FRACTURE SURGERY Left    hand and left ring finger  . LUNG CANCER SURGERY  12/2010   Left VATS, minithoracotomy, LLL superior segmentectomy  . ORIF WRIST FRACTURE Left 04/09/2014   Procedure: OPEN REDUCTION INTERNAL FIXATION (ORIF) WRIST FRACTURE;  Surgeon: Renette Butters, MD;  Location: [facility];  Service: Orthopedics;  Laterality: Left;  . PERCUTANEOUS PINNING Left 04/09/2014  Procedure: PERCUTANEOUS PINNING EXTREMITY;  Surgeon: Renette Butters, MD;  Location: [facility];  Service: Orthopedics;  Laterality: Left;  . TUBAL LIGATION    . vocal cord surgery  02/06/2011   laryngoscopy with bilateral vocal cord Radiesse injection for vocal cord paralysis  . YAG LASER APPLICATION Left 43/32/9518   Procedure: YAG LASER APPLICATION;  Surgeon: Rutherford Guys, MD;  [facility];  Service: Ophthalmology;  Laterality: Left;    There were no vitals filed for this visit.      Subjective Assessment - 07/23/17 1038     Subjective Pt notes she was in a car for four hours and her back is a little more tensed up today.    Currently in Pain? Yes   Pain Score 1    Pain Location Back   Pain Descriptors / Indicators Aching;Spasm                         [facility] Adult PT Treatment/Exercise - 07/23/17 0001      Lumbar Exercises: Standing   Other Standing Lumbar Exercises UE flexion on wall 10   Other Standing Lumbar Exercises bil staggered stance and UE flexion x10 reps each     Lumbar Exercises: Supine   Clam 20 reps   Clam Limitations Red Tband, alternate   Bridge 10 reps   Other Supine Lumbar Exercises decompression exercises 1-5 x10 reps each     Lumbar Exercises: Sidelying   Clam 10 reps   Clam Limitations 2 sets, RTB                  PT Short Term Goals - 07/05/17 1215      PT SHORT TERM GOAL #1   Title Pt will be independent with HEP and perform consistently in order to maximize overall function.    Time 3   Period Weeks   Status New   Target Date 07/26/17     PT SHORT TERM GOAL #2   Title Pt will have at least 4+/5 MMT of all muscle groups tested in order to maximize gait and decrease risk for falls.   Time 3   Period Weeks   Status New     PT SHORT TERM GOAL #3   Title Pt will be able to demonstrate proper setup and use of lumbar roll while sitting to maximize posture and decrease pain.   Time 3   Period Weeks   Status New           PT Long Term Goals - 07/05/17 1216      PT LONG TERM GOAL #1   Title Pt will report an improved tolerance to standing and walking by at least 50% (to at least 30 mins standing and 10 mins walking) to maximize her ability to perform chores at home.    Time 6   Period Weeks   Status New   Target Date 08/16/17     PT LONG TERM GOAL #2   Title Pt will be able to perform SLS on BLE for at least 10 sec to maximize gait and balance.    Time 6   Period Weeks   Status New     PT LONG TERM GOAL #3   Title Pt will have an  improved 6MWT by 137ft or > and with LRAD to demo improve overall tolerance to movement in order to maximize participation at home.    Time 6   Period Weeks  Status New     PT LONG TERM GOAL #4   Title Pt will score 52/56 on BERG to demo improved balance and decrease risk for falls.    Time 6   Period Weeks   Status New               Plan - 07/23/17 1132    Clinical Impression Statement [name] tolerated session well today. She continues to have thoracic mobility deficits noted with exercises in standing. Worked on core activation in supine with hip strengthening exercise to improve support during functional tasks.    Rehab Potential Fair   PT Duration 6 weeks   PT Treatment/Interventions ADLs/Self Care Home Management;Cryotherapy;Electrical Stimulation;Moist Heat;DME Instruction;Gait training;Stair training;Functional mobility training;Therapeutic activities;Therapeutic exercise;Balance training;Neuromuscular re-education;Patient/family education;Manual techniques;Passive range of motion;Dry needling;Energy conservation;Taping   PT Next Visit Plan Next session initiate Hip strenghening in standing. avoid flexion as pt has compression continue functional BLE strengthening, postural strengthening, and balance activities.   PT Home Exercise Plan eval: standing scapular retraction and sitting with lumbar roll; 10/4: decompression 1-5, bridging, sidelying clams   Consulted and Agree with Plan of Care Patient      Patient will benefit from skilled therapeutic intervention in order to improve the following deficits and impairments:  Cardiopulmonary status limiting activity, Decreased activity tolerance, Decreased balance, Decreased endurance, Decreased mobility, Decreased range of motion, Decreased strength, Hypomobility, Increased muscle spasms, Impaired flexibility, Improper body mechanics, Postural dysfunction, Pain  Visit Diagnosis: Pain in thoracic spine  Abnormal posture  Muscle  weakness (generalized)  Unsteadiness on feet     Problem List Patient Active Problem List   Diagnosis Date Noted  . Iron deficiency anemia 12/05/2016  . SIADH (syndrome of inappropriate ADH production) ([place])   . Polypharmacy 09/14/2016  . Muscle weakness (generalized)   . Syncope 09/10/2016  . Rib pain on left side   . Hypersomnia 02/16/2016  . Acute bronchitis 02/16/2016  . Exertional dyspnea 12/20/2015  . HLD (hyperlipidemia) 11/23/2014  . Change in bowel habits 11/10/2014  . Abdominal pain, left lower quadrant 11/10/2014  . Emphysema lung ([place]) 11/08/2014  . Helicobacter pylori gastritis 07/30/2014  . Acute on chronic respiratory failure with hypoxia ([place]) 04/10/2014  . Wrist fracture 04/09/2014  . 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 T12 burst fracture ([place]) 01/13/2014  . Constipation 12/14/2013  . Acute respiratory failure with hypoxia ([place]) 12/14/2013  . Hyponatremia 12/14/2013  . COPD (chronic obstructive pulmonary disease) ([place]) 12/14/2013  . Compression fracture of thoracic vertebra ([place]) 12/14/2013  . Hypoxia 12/13/2013  . Compression fracture 12/13/2013  . Diastolic dysfunction   . 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 Dyspepsia 02/11/2012  . Aneurysm ([place]) 02/11/2012  . Trigeminal neuralgia   . Epigastric pain 02/21/2011  . Esophageal dysphagia 02/21/2011  . Adenocarcinoma of lung ([place]) 12/07/2010  . CONTRAST DYE ALLERGY 12/30/2009   Starr Lake PT, DPT 11:35 AM, 07/23/17 [place], [place], [address] Phone: [phone]   Fax:  [phone]  Name: [name] MRN: [id number] Date of Birth: [birth year]

## 2017-07-25 ENCOUNTER — Encounter (HOSPITAL_COMMUNITY): Payer: Self-pay

## 2017-07-25 ENCOUNTER — Ambulatory Visit (HOSPITAL_COMMUNITY): Payer: PPO

## 2017-07-25 DIAGNOSIS — M546 Pain in thoracic spine: Secondary | ICD-10-CM

## 2017-07-25 DIAGNOSIS — M6281 Muscle weakness (generalized): Secondary | ICD-10-CM

## 2017-07-25 DIAGNOSIS — R2681 Unsteadiness on feet: Secondary | ICD-10-CM

## 2017-07-25 DIAGNOSIS — R293 Abnormal posture: Secondary | ICD-10-CM

## 2017-07-25 NOTE — Therapy (Signed)
[place], [place], [address] Phone: [phone]   Fax:  [phone]  Physical Therapy Treatment  Patient Details  Name: [name] MRN: [id number] Date of Birth: [birth year] Referring Provider: Jenna Luo, MD  Encounter Date: 07/25/2017      PT End of Session - 07/25/17 1123    Visit Number 6   Number of Visits 13   Date for PT Re-Evaluation 07/26/17   Authorization Type [employer]   Authorization Time Period 07/05/17 to 08/16/17   Authorization - Visit Number 6   Authorization - Number of Visits 10   PT Start Time 7062   PT Stop Time 1115   PT Time Calculation (min) 40 min   Equipment Utilized During Treatment Gait belt   Activity Tolerance Patient tolerated treatment well;No increased pain   Behavior During Therapy WFL for tasks assessed/performed      Past Medical History:  Diagnosis Date  . 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 Allergic rhinitis   . 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 Anxiety disorder   . Aortic aneurysm ([place])   . Complication of anesthesia 2011   bloodpressure dropped during colonoscopy, not problems since  . Compression fracture   . COPD (chronic obstructive pulmonary disease) ([place])   . Depression   . Diastolic dysfunction   . Diverticulosis   . Dysphagia   . Emphysema lung ([place]) 11/08/2014  . Hiatal hernia   . Hypothyroidism   . IBS (irritable bowel syndrome)   . Iron deficiency anemia due to chronic blood loss 12/05/2016  . On home O2 12/15/2013   chronic hypoxia  . PUD (peptic ulcer disease)    61yrs  . Shortness of breath    with exertion  . SIADH (syndrome of inappropriate ADH production) ([place])   . Trigeminal neuralgia     Past Surgical History:  Procedure Laterality Date  . ABDOMINAL HYSTERECTOMY    . BACK SURGERY  January 13, 2014  . CATARACT EXTRACTION    . CATARACT EXTRACTION W/PHACO   09/02/2012   Procedure: CATARACT EXTRACTION PHACO AND INTRAOCULAR LENS PLACEMENT (IOC);  Surgeon: Elta Guadeloupe T. Gershon Crane, MD;  Location: [facility];  Service: Ophthalmology;  Laterality: Left;  CDE:10.35  . CHOLECYSTECTOMY    . 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 COLONOSCOPY N/A 11/23/2014   Procedure: COLONOSCOPY;  Surgeon: Danie Binder, MD;  [facility];  Service: Endoscopy;  Laterality: N/A;  930   . ENDARTERECTOMY Right 08/21/2013   Procedure: RIGHT CAROTID ANEURYSM RESECTION;  Surgeon: Rosetta Posner, MD;  Location[facility];  Service: Vascular;  Laterality: Right;  . ESOPHAGOGASTRODUODENOSCOPY  11/13/2009   w/dilation to 53mm, gastric ulceration (H.Pylori) s/p treatment  . ESOPHAGOGASTRODUODENOSCOPY  11/21/2009   distal esophageal web, gastritis  . ESOPHAGOGASTRODUODENOSCOPY  03/14/12   BJS:[id number] in the distal esophagus/Mild gastritis/small HH. + H.pylori, prescribed Pylera. Finished treatment.   . fatty tumor removal from lt groin    . FRACTURE SURGERY Left    hand and left ring finger  . LUNG CANCER SURGERY  12/2010   Left VATS, minithoracotomy, LLL superior segmentectomy  . ORIF WRIST FRACTURE Left 04/09/2014   Procedure: OPEN REDUCTION INTERNAL FIXATION (ORIF) WRIST FRACTURE;  Surgeon: Renette Butters, MD;  Location: [place];  Service: Orthopedics;  Laterality: Left;  . PERCUTANEOUS PINNING Left 04/09/2014  Procedure: PERCUTANEOUS PINNING EXTREMITY;  Surgeon: Renette Butters, MD;  Location: [place];  Service: Orthopedics;  Laterality: Left;  . TUBAL LIGATION    . vocal cord surgery  02/06/2011   laryngoscopy with bilateral vocal cord Radiesse injection for vocal cord paralysis  . YAG LASER APPLICATION Left 21/19/4174   Procedure: YAG LASER APPLICATION;  Surgeon: Rutherford Guys, MD;  Location: [facility];  Service: Ophthalmology;  Laterality: Left;    There were no vitals filed for this visit.      Subjective Assessment - 07/25/17 1036     Subjective Pt stated she spent 2 hours working on her flowers and has some soreness in back today, pain scale .5/10.  Reports compliance wiht HEP daily and has walked a mile this morning   Patient Stated Goals walk without a RW and get over fear of falling   Currently in Pain? Yes   Pain Score 1    Pain Location Back   Pain Orientation Lower;Left   Pain Descriptors / Indicators Aching;Sore   Pain Type Chronic pain   Pain Onset More than a month ago   Pain Frequency Intermittent   Aggravating Factors  walking, standing, sitting for too long   Pain Relieving Factors laying flat   Effect of Pain on Daily Activities increases                         [facility] Adult PT Treatment/Exercise - 07/25/17 0001      Lumbar Exercises: Standing   Functional Squats 10 reps   Other Standing Lumbar Exercises UE flexion on wall 10   Other Standing Lumbar Exercises tandem stance 3x 30"; side step 4RT in // bars; isometric abd BLE 10x 3"     Lumbar Exercises: Seated   Sit to Stand 10 reps   Sit to Stand Limitations no HHA, eccentric control     Lumbar Exercises: Supine   Clam 20 reps   Clam Limitations Red Tband   Bridge 20 reps   Other Supine Lumbar Exercises reports complaince with decompression tband HEP                  PT Short Term Goals - 07/05/17 1215      PT SHORT TERM GOAL #1   Title Pt will be independent with HEP and perform consistently in order to maximize overall function.    Time 3   Period Weeks   Status New   Target Date 07/26/17     PT SHORT TERM GOAL #2   Title Pt will have at least 4+/5 MMT of all muscle groups tested in order to maximize gait and decrease risk for falls.   Time 3   Period Weeks   Status New     PT SHORT TERM GOAL #3   Title Pt will be able to demonstrate proper setup and use of lumbar roll while sitting to maximize posture and decrease pain.   Time 3   Period Weeks   Status New           PT Long Term Goals - 07/05/17  1216      PT LONG TERM GOAL #1   Title Pt will report an improved tolerance to standing and walking by at least 50% (to at least 30 mins standing and 10 mins walking) to maximize her ability to perform chores at home.    Time 6   Period Weeks   Status New  Target Date 08/16/17     PT LONG TERM GOAL #2   Title Pt will be able to perform SLS on BLE for at least 10 sec to maximize gait and balance.    Time 6   Period Weeks   Status New     PT LONG TERM GOAL #3   Title Pt will have an improved 6MWT by 112ft or > and with LRAD to demo improve overall tolerance to movement in order to maximize participation at home.    Time 6   Period Weeks   Status New     PT LONG TERM GOAL #4   Title Pt will score 52/56 on BERG to demo improved balance and decrease risk for falls.    Time 6   Period Weeks   Status New               Plan - 07/25/17 1124    Clinical Impression Statement Pt progressing well towards goals with reports of pain mininal and compliance with HEP/walking progress.  Progressed to standing CKC for hip strengthening, pt able to complete with good form following initial instruction and cueing for posture.  Min A with balance activities for safety.     Rehab Potential Fair   PT Frequency 2x / week   PT Duration 6 weeks   PT Next Visit Plan Next session begin forward lunges and progress to standing theraband posture strengthening.  Continue focus wiht BLE strengthenig, posture strengthening and balance activiteis.     PT Home Exercise Plan eval: standing scapular retraction and sitting with lumbar roll; 10/4: decompression 1-5, bridging, sidelying clams      Patient will benefit from skilled therapeutic intervention in order to improve the following deficits and impairments:  Cardiopulmonary status limiting activity, Decreased activity tolerance, Decreased balance, Decreased endurance, Decreased mobility, Decreased range of motion, Decreased strength, Hypomobility,  Increased muscle spasms, Impaired flexibility, Improper body mechanics, Postural dysfunction, Pain  Visit Diagnosis: Pain in thoracic spine  Abnormal posture  Muscle weakness (generalized)  Unsteadiness on feet     Problem List Patient Active Problem List   Diagnosis Date Noted  . Iron deficiency anemia 12/05/2016  . SIADH (syndrome of inappropriate ADH production) ([place])   . Polypharmacy 09/14/2016  . Muscle weakness (generalized)   . Syncope 09/10/2016  . Rib pain on left side   . Hypersomnia 02/16/2016  . Acute bronchitis 02/16/2016  . Exertional dyspnea 12/20/2015  . HLD (hyperlipidemia) 11/23/2014  . Change in bowel habits 11/10/2014  . Abdominal pain, left lower quadrant 11/10/2014  . Emphysema lung ([place]) 11/08/2014  . Helicobacter pylori gastritis 07/30/2014  . Acute on chronic respiratory failure with hypoxia ([place]) 04/10/2014  . Wrist fracture 04/09/2014  . 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 T12 burst fracture ([place]) 01/13/2014  . Constipation 12/14/2013  . Acute respiratory failure with hypoxia ([place]) 12/14/2013  . Hyponatremia 12/14/2013  . COPD (chronic obstructive pulmonary disease) ([place]) 12/14/2013  . Compression fracture of thoracic vertebra ([place]) 12/14/2013  . Hypoxia 12/13/2013  . Compression fracture 12/13/2013  . Diastolic dysfunction   . 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 Dyspepsia 02/11/2012  . Aneurysm ([place]) 02/11/2012  . Trigeminal neuralgia   . Epigastric pain 02/21/2011  . Esophageal dysphagia 02/21/2011  . Adenocarcinoma of lung ([place]) 12/07/2010  . CONTRAST DYE ALLERGY 12/30/2009   Ihor Austin, [place]; [place]  Ihor Austin  Denice Paradise 07/25/2017, 11:29 AM  [place], [place], [address] Phone: [phone]   Fax:  [phone]  Name: JAZARA SWINEY MRN: [id number] Date of Birth: 1941/07/15

## 2017-07-30 ENCOUNTER — Ambulatory Visit (HOSPITAL_COMMUNITY): Payer: PPO

## 2017-07-30 ENCOUNTER — Encounter (HOSPITAL_COMMUNITY): Payer: Self-pay

## 2017-07-30 DIAGNOSIS — R293 Abnormal posture: Secondary | ICD-10-CM

## 2017-07-30 DIAGNOSIS — M546 Pain in thoracic spine: Secondary | ICD-10-CM | POA: Diagnosis not present

## 2017-07-30 DIAGNOSIS — R2681 Unsteadiness on feet: Secondary | ICD-10-CM

## 2017-07-30 DIAGNOSIS — M6281 Muscle weakness (generalized): Secondary | ICD-10-CM

## 2017-07-30 NOTE — Therapy (Signed)
[place], [place], [address] Phone: [phone]   Fax:  [phone]  Physical Therapy Treatment /Re-assessment  Patient Details  Name: [name] MRN: [id number] Date of Birth: Sep 29, 1941 Referring Provider: Jenna Luo, MD  Encounter Date: 07/30/2017      PT End of Session - 07/30/17 1121    Visit Number 7   Number of Visits 13   Date for PT Re-Evaluation 08/15/17   Authorization Type [employer]   Authorization Time Period 07/05/17 to 08/16/17   Authorization - Visit Number 7   Authorization - Number of Visits 10   PT Start Time 8242   PT Stop Time 1118   PT Time Calculation (min) 46 min   Equipment Utilized During Treatment Gait belt   Activity Tolerance Patient tolerated treatment well;No increased pain   Behavior During Therapy WFL for tasks assessed/performed      Past Medical History:  Diagnosis Date  . 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 Allergic rhinitis   . 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 Anxiety disorder   . Aortic aneurysm ([place])   . Complication of anesthesia 2011   bloodpressure dropped during colonoscopy, not problems since  . Compression fracture   . COPD (chronic obstructive pulmonary disease) ([place])   . Depression   . Diastolic dysfunction   . Diverticulosis   . Dysphagia   . Emphysema lung ([place]) 11/08/2014  . Hiatal hernia   . Hypothyroidism   . IBS (irritable bowel syndrome)   . Iron deficiency anemia due to chronic blood loss 12/05/2016  . On home O2 12/15/2013   chronic hypoxia  . PUD (peptic ulcer disease)    19yr  . Shortness of breath    with exertion  . SIADH (syndrome of inappropriate ADH production) ([place]   . Trigeminal neuralgia     Past Surgical History:  Procedure Laterality Date  . ABDOMINAL HYSTERECTOMY    . BACK SURGERY  January 13, 2014  . CATARACT EXTRACTION    . CATARACT  EXTRACTION W/PHACO  09/02/2012   Procedure: CATARACT EXTRACTION PHACO AND INTRAOCULAR LENS PLACEMENT (IOC);  Surgeon: MElta GuadeloupeT. SGershon Crane MD;  Location: [facility];  Service: Ophthalmology;  Laterality: Left;  CDE:10.35  . CHOLECYSTECTOMY    . 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 COLONOSCOPY N/A 11/23/2014   Procedure: COLONOSCOPY;  Surgeon: SDanie Binder MD;  [facility];  Service: Endoscopy;  Laterality: N/A;  930   . ENDARTERECTOMY Right 08/21/2013   Procedure: RIGHT CAROTID ANEURYSM RESECTION;  Surgeon: TRosetta Posner MD;  [facility];  Service: Vascular;  Laterality: Right;  . ESOPHAGOGASTRODUODENOSCOPY  11/13/2009   w/dilation to 152m gastric ulceration (H.Pylori) s/p treatment  . ESOPHAGOGASTRODUODENOSCOPY  11/21/2009   distal esophageal web, gastritis  . ESOPHAGOGASTRODUODENOSCOPY  03/14/12   SLPNT:IRWERXVQMn the distal esophagus/Mild gastritis/small HH. + H.pylori, prescribed Pylera. Finished treatment.   . fatty tumor removal from lt groin    . FRACTURE SURGERY Left    hand and left ring finger  . LUNG CANCER SURGERY  12/2010   Left VATS, minithoracotomy, LLL superior segmentectomy  . ORIF WRIST FRACTURE Left 04/09/2014   Procedure: OPEN REDUCTION INTERNAL FIXATION (ORIF) WRIST FRACTURE;  Surgeon: TiRenette ButtersMD;  [facility] Service: [facility];  Laterality: Left;  . PERCUTANEOUS PINNING Left 04/09/2014  Procedure: PERCUTANEOUS PINNING EXTREMITY;  Surgeon: Timothy D Murphy, MD;  [facility];  Service: Orthopedics;  Laterality: Left;  . TUBAL LIGATION    . vocal cord surgery  02/06/2011   laryngoscopy with bilateral vocal cord Radiesse injection for vocal cord paralysis  . YAG LASER APPLICATION Left 09/20/2015   Procedure: YAG LASER APPLICATION;  Surgeon: Mark Shapiro, MD;  Location[facility];  Service: Ophthalmology;  Laterality: Left;    There were no vitals filed for this visit.      Subjective Assessment -  07/30/17 1031    Subjective Pt states that she is feeling better today but still tightness in lower Rt back.    Currently in Pain? Yes   Pain Score 1    Pain Location Back   Pain Orientation Right            [facility] PT Assessment - 07/30/17 0001      Strength   Right Hip Flexion 5/5   Right Hip Extension 4+/5   Right Hip ABduction 4/5   Left Hip Flexion 5/5   Left Hip Extension 5/5   Left Hip ABduction 4/5   Right Ankle Dorsiflexion 4+/5   Left Ankle Dorsiflexion 5/5     Flexibility   Soft Tissue Assessment /Muscle Length no     Palpation   Palpation comment thoracic and lumbar paraspinals, periscapular musculature                     [facility] Adult PT Treatment/Exercise - 07/30/17 0001      Lumbar Exercises: Standing   Heel Raises 10 reps   Functional Squats 10 reps   Lifting Limitations Forward step up x 10 reps   Forward Lunge 5 reps   Forward Lunge Limitations both   Wall Slides 10 reps   Scapular Retraction 10 reps;Theraband   Theraband Level (Scapular Retraction) Level 2 (Red)   Row 10 reps;Theraband   Theraband Level (Row) Level 2 (Red)   Shoulder Extension 10 reps;Theraband   Theraband Level (Shoulder Extension) Level 2 (Red)   Other Standing Lumbar Exercises UE flexion on wall 10   Other Standing Lumbar Exercises tandem stance 3x 30"; side step 4RT in // bars; isometric abd BLE 10x 3"                  PT Short Term Goals - 07/30/17 1048      PT SHORT TERM GOAL #1   Title Pt will be independent with HEP and perform consistently in order to maximize overall function.    Baseline Pt reports completing daily    Time 3   Period Weeks   Status On-going     PT SHORT TERM GOAL #2   Title Pt will have at least 4+/5 MMT of all muscle groups tested in order to maximize gait and decrease risk for falls.   Baseline Improvement hip flexion; bilateral hip abduction 4/5   Time 3   Period Weeks   Status Partially Met     PT SHORT TERM GOAL  #3   Title Pt will be able to demonstrate proper setup and use of lumbar roll while sitting to maximize posture and decrease pain.   Baseline Pt knowledgeable in support for low back in seated.    Time 3   Period Weeks   Status Achieved           PT Long Term Goals - 07/05/17 1216      PT LONG   TERM GOAL #1   Title Pt will report an improved tolerance to standing and walking by at least 50% (to at least 30 mins standing and 10 mins walking) to maximize her ability to perform chores at home.    Time 6   Period Weeks   Status New   Target Date 08/16/17     PT LONG TERM GOAL #2   Title Pt will be able to perform SLS on BLE for at least 10 sec to maximize gait and balance.    Time 6   Period Weeks   Status New     PT LONG TERM GOAL #3   Title Pt will have an improved 6MWT by 100ft or > and with LRAD to demo improve overall tolerance to movement in order to maximize participation at home.    Time 6   Period Weeks   Status New     PT LONG TERM GOAL #4   Title Pt will score 52/56 on BERG to demo improved balance and decrease risk for falls.    Time 6   Period Weeks   Status New               Plan - 07/30/17 1122    Clinical Impression Statement Completed re-assessment today with improvement in hip flexion and extension, however, continued limitation hip abduction strength. Patient tolerated addition of postural strenghening exercises today to improve mechanics and decrease stress lumbar spine. Patient continues to have reports of "tightness" Rt lumbar region. Good progress towards goals and plan to resume further balance and strengthening exercises next session.    Rehab Potential Fair   PT Frequency 2x / week   PT Duration 6 weeks   PT Next Visit Plan Continues focus with dynamic balance, hip abduction strength and posture    PT Home Exercise Plan eval: standing scapular retraction and sitting with lumbar roll; 10/4: decompression 1-5, bridging, sidelying clams       Patient will benefit from skilled therapeutic intervention in order to improve the following deficits and impairments:  Cardiopulmonary status limiting activity, Decreased activity tolerance, Decreased balance, Decreased endurance, Decreased mobility, Decreased range of motion, Decreased strength, Hypomobility, Increased muscle spasms, Impaired flexibility, Improper body mechanics, Postural dysfunction, Pain  Visit Diagnosis: Pain in thoracic spine  Abnormal posture  Muscle weakness (generalized)  Unsteadiness on feet     Problem List Patient Active Problem List   Diagnosis Date Noted  . Iron deficiency anemia 12/05/2016  . SIADH (syndrome of inappropriate ADH production) ([facility])   . Polypharmacy 09/14/2016  . Muscle weakness (generalized)   . Syncope 09/10/2016  . Rib pain on left side   . Hypersomnia 02/16/2016  . Acute bronchitis 02/16/2016  . Exertional dyspnea 12/20/2015  . HLD (hyperlipidemia) 11/23/2014  . Change in bowel habits 11/10/2014  . Abdominal pain, left lower quadrant 11/10/2014  . Emphysema lung ([facility]) 11/08/2014  . Helicobacter pylori gastritis 07/30/2014  . Acute on chronic respiratory failure with hypoxia ([facility]) 04/10/2014  . Wrist fracture 04/09/2014  . 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 T12 burst fracture ([facility]) 01/13/2014  . Constipation 12/14/2013  . Acute respiratory failure with hypoxia ([facility]) 12/14/2013  . Hyponatremia 12/14/2013  . COPD (chronic obstructive pulmonary disease) ([facility]) 12/14/2013  . Compression fracture of thoracic vertebra ([facility]) 12/14/2013  . Hypoxia 12/13/2013  . Compression fracture 12/13/2013  . Diastolic dysfunction   . 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 Abdominal aneurysm without   mention of rupture 02/26/2012  . Dyspepsia 02/11/2012  . Aneurysm ([facility]) 02/11/2012  . Trigeminal neuralgia   . Epigastric pain 02/21/2011  . Esophageal dysphagia 02/21/2011  .  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 CONTRAST DYE ALLERGY 12/30/2009   Siona Taft PT, DPT 11:28 AM, 07/30/17 [phone]  [facility] [address] [place], [place], [address] Phone: [phone]   Fax:  [phone]  Name: Lauren S Keay MRN: [id number] Date of Birth: 03/03/1941   

## 2017-08-01 ENCOUNTER — Encounter: Payer: Self-pay | Admitting: Family Medicine

## 2017-08-02 ENCOUNTER — Ambulatory Visit (HOSPITAL_COMMUNITY): Payer: PPO

## 2017-08-02 ENCOUNTER — Encounter (HOSPITAL_COMMUNITY): Payer: Self-pay

## 2017-08-02 DIAGNOSIS — R2681 Unsteadiness on feet: Secondary | ICD-10-CM

## 2017-08-02 DIAGNOSIS — M546 Pain in thoracic spine: Secondary | ICD-10-CM

## 2017-08-02 DIAGNOSIS — R293 Abnormal posture: Secondary | ICD-10-CM

## 2017-08-02 DIAGNOSIS — M6281 Muscle weakness (generalized): Secondary | ICD-10-CM

## 2017-08-02 NOTE — Therapy (Signed)
[place], [place], [address] Phone: [phone]   Fax:  [phone]  Physical Therapy Treatment  Patient Details  Name: [name] MRN: [id number] Date of Birth: Mar 01, 1941 Referring Provider: Jenna Luo, MD  Encounter Date: 08/02/2017      PT End of Session - 08/02/17 1113    Visit Number 8   Number of Visits 13   Date for PT Re-Evaluation 08/15/17   Authorization Type [employer]   Authorization Time Period 07/05/17 to 08/16/17   Authorization - Visit Number 8   Authorization - Number of Visits 10   PT Start Time 9417   PT Stop Time 1113   PT Time Calculation (min) 40 min   Equipment Utilized During Treatment Gait belt   Activity Tolerance Patient tolerated treatment well;No increased pain   Behavior During Therapy WFL for tasks assessed/performed      Past Medical History:  Diagnosis Date  . Adenocarcinoma of lung ([place]) 12/2010   left lung/surg only  . Allergic rhinitis   . 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 Anxiety disorder   . Aortic aneurysm ([place])   . Complication of anesthesia 2011   bloodpressure dropped during colonoscopy, not problems since  . Compression fracture   . COPD (chronic obstructive pulmonary disease) ([place])   . Depression   . Diastolic dysfunction   . Diverticulosis   . Dysphagia   . Emphysema lung ([place]) 11/08/2014  . Hiatal hernia   . Hypothyroidism   . IBS (irritable bowel syndrome)   . Iron deficiency anemia due to chronic blood loss 12/05/2016  . On home O2 12/15/2013   chronic hypoxia  . PUD (peptic ulcer disease)    32yr  . Shortness of breath    with exertion  . SIADH (syndrome of inappropriate ADH production) ([facility]   . Trigeminal neuralgia     Past Surgical History:  Procedure Laterality Date  . ABDOMINAL HYSTERECTOMY    . BACK SURGERY  January 13, 2014  . CATARACT EXTRACTION    . CATARACT EXTRACTION W/PHACO   09/02/2012   Procedure: CATARACT EXTRACTION PHACO AND INTRAOCULAR LENS PLACEMENT (IOC);  Surgeon: MElta GuadeloupeT. SGershon Crane MD;  Location: [facility];  Service: Ophthalmology;  Laterality: Left;  CDE:10.35  . CHOLECYSTECTOMY    . 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 COLONOSCOPY N/A 11/23/2014   Procedure: COLONOSCOPY;  Surgeon: SDanie Binder MD;  [facility];  Service: Endoscopy;  Laterality: N/A;  930   . ENDARTERECTOMY Right 08/21/2013   Procedure: RIGHT CAROTID ANEURYSM RESECTION;  Surgeon: TRosetta Posner MD;  [facility];  Service: Vascular;  Laterality: Right;  . ESOPHAGOGASTRODUODENOSCOPY  11/13/2009   w/dilation to 17m gastric ulceration (H.Pylori) s/p treatment  . ESOPHAGOGASTRODUODENOSCOPY  11/21/2009   distal esophageal web, gastritis  . ESOPHAGOGASTRODUODENOSCOPY  03/14/12   SLEYC:XKGYJEHUDn the distal esophagus/Mild gastritis/small HH. + H.pylori, prescribed Pylera. Finished treatment.   . fatty tumor removal from lt groin    . FRACTURE SURGERY Left    hand and left ring finger  . LUNG CANCER SURGERY  12/2010   Left VATS, minithoracotomy, LLL superior segmentectomy  . ORIF WRIST FRACTURE Left 04/09/2014   Procedure: OPEN REDUCTION INTERNAL FIXATION (ORIF) WRIST FRACTURE;  Surgeon: TiRenette ButtersMD;  [facility] Service: [facility];  Laterality: Left;  . PERCUTANEOUS PINNING Left 04/09/2014  Procedure: PERCUTANEOUS PINNING EXTREMITY;  Surgeon: Renette Butters, MD;  Location: [facility];  Service: Orthopedics;  Laterality: Left;  . TUBAL LIGATION    . vocal cord surgery  02/06/2011   laryngoscopy with bilateral vocal cord Radiesse injection for vocal cord paralysis  . YAG LASER APPLICATION Left 40/98/1191   Procedure: YAG LASER APPLICATION;  Surgeon: Rutherford Guys, MD;  Location: [facility];  Service: Ophthalmology;  Laterality: Left;    There were no vitals filed for this visit.      Subjective Assessment - 08/02/17 1035     Subjective Pt reports stiffness in her LBP. She denies any pain, just reports tightness.    Currently in Pain? No/denies              [facility] Adult PT Treatment/Exercise - 08/02/17 0001      Lumbar Exercises: Stretches   Single Knee to Chest Stretch 2 reps;30 seconds   Single Knee to Chest Stretch Limitations BLE   Double Knee to Chest Stretch 2 reps;30 seconds     Lumbar Exercises: Standing   Lifting Limitations Forward step up x 10 reps each, 4" step   Scapular Retraction 15 reps;Theraband   Theraband Level (Scapular Retraction) Level 2 (Red)   Shoulder Extension 15 reps;Theraband   Theraband Level (Shoulder Extension) Level 2 (Red)   Other Standing Lumbar Exercises march with pulldown with RTB 5x3" holds each; SLS with vectors x3RT each   Other Standing Lumbar Exercises tandem stance and palov press with RTB x10 reps each; tandem stance and UE flexion with 1# wt bar x10 reps each     Lumbar Exercises: Seated   Other Seated Lumbar Exercises band pulls with RTB x15 reps              PT Education - 08/02/17 1035    Education provided Yes   Education Details exercise technique, cotinue HEP   Person(s) Educated Patient   Methods Explanation;Demonstration   Comprehension Verbalized understanding;Returned demonstration          PT Short Term Goals - 07/30/17 1048      PT SHORT TERM GOAL #1   Title Pt will be independent with HEP and perform consistently in order to maximize overall function.    Baseline Pt reports completing daily    Time 3   Period Weeks   Status On-going     PT SHORT TERM GOAL #2   Title Pt will have at least 4+/5 MMT of all muscle groups tested in order to maximize gait and decrease risk for falls.   Baseline Improvement hip flexion; bilateral hip abduction 4/5   Time 3   Period Weeks   Status Partially Met     PT SHORT TERM GOAL #3   Title Pt will be able to demonstrate proper setup and use of lumbar roll while sitting to maximize posture  and decrease pain.   Baseline Pt knowledgeable in support for low back in seated.    Time 3   Period Weeks   Status Achieved           PT Long Term Goals - 07/05/17 1216      PT LONG TERM GOAL #1   Title Pt will report an improved tolerance to standing and walking by at least 50% (to at least 30 mins standing and 10 mins walking) to maximize her ability to perform chores at home.    Time 6   Period Weeks   Status New   Target  Date 08/16/17     PT LONG TERM GOAL #2   Title Pt will be able to perform SLS on BLE for at least 10 sec to maximize gait and balance.    Time 6   Period Weeks   Status New     PT LONG TERM GOAL #3   Title Pt will have an improved 6MWT by 139f or > and with LRAD to demo improve overall tolerance to movement in order to maximize participation at home.    Time 6   Period Weeks   Status New     PT LONG TERM GOAL #4   Title Pt will score 52/56 on BERG to demo improved balance and decrease risk for falls.    Time 6   Period Weeks   Status New               Plan - 08/02/17 1109    Clinical Impression Statement Session focused on improving mobility, posture, balance and functional strength this date. Pt tolerated well and was challenged during balance activities. She reported improved symptoms at EOS, she was just slightly limited due to her breathing.   Rehab Potential Fair   PT Frequency 2x / week   PT Duration 6 weeks   PT Next Visit Plan Continues focus with dynamic balance, hip abduction strength and posture; add Y's on wall with lift-off and trial wall push-ups   PT Home Exercise Plan eval: standing scapular retraction and sitting with lumbar roll; 10/4: decompression 1-5, bridging, sidelying clams   Consulted and Agree with Plan of Care Patient      Patient will benefit from skilled therapeutic intervention in order to improve the following deficits and impairments:  Cardiopulmonary status limiting activity, Decreased activity  tolerance, Decreased balance, Decreased endurance, Decreased mobility, Decreased range of motion, Decreased strength, Hypomobility, Increased muscle spasms, Impaired flexibility, Improper body mechanics, Postural dysfunction, Pain  Visit Diagnosis: Pain in thoracic spine  Abnormal posture  Muscle weakness (generalized)  Unsteadiness on feet     Problem List Patient Active Problem List   Diagnosis Date Noted  . Iron deficiency anemia 12/05/2016  . SIADH (syndrome of inappropriate ADH production) ([place]   . Polypharmacy 09/14/2016  . Muscle weakness (generalized)   . Syncope 09/10/2016  . Rib pain on left side   . Hypersomnia 02/16/2016  . Acute bronchitis 02/16/2016  . Exertional dyspnea 12/20/2015  . HLD (hyperlipidemia) 11/23/2014  . Change in bowel habits 11/10/2014  . Abdominal pain, left lower quadrant 11/10/2014  . Emphysema lung ([place] 11/08/2014  . Helicobacter pylori gastritis 07/30/2014  . Acute on chronic respiratory failure with hypoxia ([place] 04/10/2014  . Wrist fracture 04/09/2014  . 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 T12 burst fracture ([facility] 01/13/2014  . Constipation 12/14/2013  . Acute respiratory failure with hypoxia ([facility] 12/14/2013  . Hyponatremia 12/14/2013  . COPD (chronic obstructive pulmonary disease) ([facility] [place] 12/14/2013  . Compression fracture of thoracic vertebra ([place] 12/14/2013  . Hypoxia 12/13/2013  . Compression fracture 12/13/2013  . Diastolic dysfunction   . 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 Dyspepsia 02/11/2012  . Aneurysm ([place] 02/11/2012  . Trigeminal neuralgia   . Epigastric pain 02/21/2011  . Esophageal dysphagia 02/21/2011  . Adenocarcinoma of lung ([place] 12/07/2010  . CONTRAST DYE ALLERGY 12/30/2009      BGeraldine SolarPT, DPT  [facility]  [address] [place], [place], [address] Phone: [phone]   Fax:  [phone]  Name: [name] MRN: [id number] Date of Birth: [birth year]

## 2017-08-04 DIAGNOSIS — C3490 Malignant neoplasm of unspecified part of unspecified bronchus or lung: Secondary | ICD-10-CM | POA: Diagnosis not present

## 2017-08-04 DIAGNOSIS — R0602 Shortness of breath: Secondary | ICD-10-CM | POA: Diagnosis not present

## 2017-08-04 DIAGNOSIS — R0902 Hypoxemia: Secondary | ICD-10-CM | POA: Diagnosis not present

## 2017-08-04 DIAGNOSIS — J9621 Acute and chronic respiratory failure with hypoxia: Secondary | ICD-10-CM | POA: Diagnosis not present

## 2017-08-06 ENCOUNTER — Encounter (HOSPITAL_COMMUNITY): Payer: Self-pay

## 2017-08-06 ENCOUNTER — Ambulatory Visit (HOSPITAL_COMMUNITY): Payer: PPO

## 2017-08-06 DIAGNOSIS — M6281 Muscle weakness (generalized): Secondary | ICD-10-CM

## 2017-08-06 DIAGNOSIS — M546 Pain in thoracic spine: Secondary | ICD-10-CM | POA: Diagnosis not present

## 2017-08-06 DIAGNOSIS — R293 Abnormal posture: Secondary | ICD-10-CM

## 2017-08-06 DIAGNOSIS — R2681 Unsteadiness on feet: Secondary | ICD-10-CM

## 2017-08-06 NOTE — Therapy (Signed)
[place], [place], [address] Phone: [phone]   Fax:  [phone]  Physical Therapy Treatment  Patient Details  Name: [name] MRN: [id number] Date of Birth: 1941/02/27 Referring Provider: Jenna Luo, MD  Encounter Date: 08/06/2017      PT End of Session - 08/06/17 1040    Visit Number 9   Number of Visits 13   Date for PT Re-Evaluation 08/15/17   Authorization Type [employer]   Authorization Time Period 07/05/17 to 08/16/17   Authorization - Visit Number 9   Authorization - Number of Visits 10   PT Start Time 0017   PT Stop Time 1116   PT Time Calculation (min) 41 min   Equipment Utilized During Treatment Gait belt   Activity Tolerance Patient tolerated treatment well;No increased pain   Behavior During Therapy WFL for tasks assessed/performed      Past Medical History:  Diagnosis Date  . 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 Allergic rhinitis   . 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 Anxiety disorder   . Aortic aneurysm ([place])   . Complication of anesthesia 2011   bloodpressure dropped during colonoscopy, not problems since  . Compression fracture   . COPD (chronic obstructive pulmonary disease) ([place])   . Depression   . Diastolic dysfunction   . Diverticulosis   . Dysphagia   . Emphysema lung ([place]) 11/08/2014  . Hiatal hernia   . Hypothyroidism   . IBS (irritable bowel syndrome)   . Iron deficiency anemia due to chronic blood loss 12/05/2016  . On home O2 12/15/2013   chronic hypoxia  . PUD (peptic ulcer disease)    18yr  . Shortness of breath    with exertion  . SIADH (syndrome of inappropriate ADH production) ([place]   . Trigeminal neuralgia     Past Surgical History:  Procedure Laterality Date  . ABDOMINAL HYSTERECTOMY    . BACK SURGERY  January 13, 2014  . CATARACT EXTRACTION    . CATARACT EXTRACTION W/PHACO   09/02/2012   Procedure: CATARACT EXTRACTION PHACO AND INTRAOCULAR LENS PLACEMENT (IOC);  Surgeon: MElta GuadeloupeT. SGershon Crane MD;  Location: [facility];  Service: Ophthalmology;  Laterality: Left;  CDE:10.35  . CHOLECYSTECTOMY    . 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 COLONOSCOPY N/A 11/23/2014   Procedure: COLONOSCOPY;  Surgeon: SDanie Binder MD;  [facility];  Service: Endoscopy;  Laterality: N/A;  930   . ENDARTERECTOMY Right 08/21/2013   Procedure: RIGHT CAROTID ANEURYSM RESECTION;  Surgeon: TRosetta Posner MD;  [facility];  Service: Vascular;  Laterality: Right;  . ESOPHAGOGASTRODUODENOSCOPY  11/13/2009   w/dilation to 172m gastric ulceration (H.Pylori) s/p treatment  . ESOPHAGOGASTRODUODENOSCOPY  11/21/2009   distal esophageal web, gastritis  . ESOPHAGOGASTRODUODENOSCOPY  03/14/12   SLCBS:[id number] the distal esophagus/Mild gastritis/small HH. + H.pylori, prescribed Pylera. Finished treatment.   . fatty tumor removal from lt groin    . FRACTURE SURGERY Left    hand and left ring finger  . LUNG CANCER SURGERY  12/2010   Left VATS, minithoracotomy, LLL superior segmentectomy  . ORIF WRIST FRACTURE Left 04/09/2014   Procedure: OPEN REDUCTION INTERNAL FIXATION (ORIF) WRIST FRACTURE;  Surgeon: TiRenette ButtersMD;  [facility] Service: [facility];  Laterality: Left;  . PERCUTANEOUS PINNING Left 04/09/2014  Procedure: PERCUTANEOUS PINNING EXTREMITY;  Surgeon: Renette Butters, MD;  Location: [facility];  Service: Orthopedics;  Laterality: Left;  . TUBAL LIGATION    . vocal cord surgery  02/06/2011   laryngoscopy with bilateral vocal cord Radiesse injection for vocal cord paralysis  . YAG LASER APPLICATION Left 21/19/4174   Procedure: YAG LASER APPLICATION;  Surgeon: Rutherford Guys, MD;  [facility];  Service: Ophthalmology;  Laterality: Left;    There were no vitals filed for this visit.      Subjective Assessment - 08/06/17 1031     Subjective Pt reports she has stiffness in upper and lower back, no reports of pain.     Patient Stated Goals walk without a RW and get over fear of falling   Currently in Pain? No/denies         08/06/17 0001  Lumbar Exercises: Stretches  Single Knee to Chest Stretch 2 reps;30 seconds  Single Knee to Chest Stretch Limitations BLE  Double Knee to Chest Stretch 2 reps;30 seconds  Lumbar Exercises: Standing  Scapular Retraction 15 reps;Theraband  Theraband Level (Scapular Retraction) Level 2 (Red)  Row Theraband;15 reps  Theraband Level (Row) Level 2 (Red)  Shoulder Extension 15 reps;Theraband  Theraband Level (Shoulder Extension) Level 2 (Red)  Other Standing Lumbar Exercises march with pulldown with RTB 5x3" holds each; SLS with vectors x3RT each  Other Standing Lumbar Exercises y at wall 10x3", wall push ups 10x (cueing for form); tandem stance with palov press with RTB 10x each position; sidestep 3RT         [facility] Adult PT Treatment/Exercise - 08/06/17 0001      Lumbar Exercises: Stretches   Single Knee to Chest Stretch 2 reps;30 seconds   Single Knee to Chest Stretch Limitations BLE   Double Knee to Chest Stretch 2 reps;30 seconds     Lumbar Exercises: Standing   Scapular Retraction 15 reps;Theraband   Theraband Level (Scapular Retraction) Level 2 (Red)   Row Theraband;15 reps   Theraband Level (Row) Level 2 (Red)   Shoulder Extension 15 reps;Theraband   Theraband Level (Shoulder Extension) Level 2 (Red)   Other Standing Lumbar Exercises march with pulldown with RTB 5x3" holds each; SLS with vectors x3RT each   Other Standing Lumbar Exercises y at wall 10x3", wall push ups 10x (cueing for form); tandem stance with palov press with RTB 10x each position; sidestep 3RT                  PT Short Term Goals - 07/30/17 1048      PT SHORT TERM GOAL #1   Title Pt will be independent with HEP and perform consistently in order to maximize overall function.     Baseline Pt reports completing daily    Time 3   Period Weeks   Status On-going     PT SHORT TERM GOAL #2   Title Pt will have at least 4+/5 MMT of all muscle groups tested in order to maximize gait and decrease risk for falls.   Baseline Improvement hip flexion; bilateral hip abduction 4/5   Time 3   Period Weeks   Status Partially Met     PT SHORT TERM GOAL #3   Title Pt will be able to demonstrate proper setup and use of lumbar roll while sitting to maximize posture and decrease pain.   Baseline Pt knowledgeable in support for low back in seated.    Time 3   Period Weeks  Status Achieved           PT Long Term Goals - 07/05/17 1216      PT LONG TERM GOAL #1   Title Pt will report an improved tolerance to standing and walking by at least 50% (to at least 30 mins standing and 10 mins walking) to maximize her ability to perform chores at home.    Time 6   Period Weeks   Status New   Target Date 08/16/17     PT LONG TERM GOAL #2   Title Pt will be able to perform SLS on BLE for at least 10 sec to maximize gait and balance.    Time 6   Period Weeks   Status New     PT LONG TERM GOAL #3   Title Pt will have an improved 6MWT by 164f or > and with LRAD to demo improve overall tolerance to movement in order to maximize participation at home.    Time 6   Period Weeks   Status New     PT LONG TERM GOAL #4   Title Pt will score 52/56 on BERG to demo improved balance and decrease risk for falls.    Time 6   Period Weeks   Status New               Plan - 08/06/17 1118    Clinical Impression Statement Continued session focus improving spinal mobilty, posture and functional strengthening and balance training.  Added CKC for postural strengthening with min cueing for form and proper technqiue.  Min A for safety with balance activites.  No reports of increased pain, was limited by fatigue with activities.     Rehab Potential Fair   PT Frequency 2x / week   PT  Duration 6 weeks   PT Treatment/Interventions ADLs/Self Care Home Management;Cryotherapy;Electrical Stimulation;Moist Heat;DME Instruction;Gait training;Stair training;Functional mobility training;Therapeutic activities;Therapeutic exercise;Balance training;Neuromuscular re-education;Patient/family education;Manual techniques;Passive range of motion;Dry needling;Energy conservation;Taping   PT Next Visit Plan Continues focus with dynamic balance, hip abduction strength and posture; continue CKC postural strengthenuing.  Add resistnace wiht standing sidestep next session.     PT Home Exercise Plan eval: standing scapular retraction and sitting with lumbar roll; 10/4: decompression 1-5, bridging, sidelying clams      Patient will benefit from skilled therapeutic intervention in order to improve the following deficits and impairments:  Cardiopulmonary status limiting activity, Decreased activity tolerance, Decreased balance, Decreased endurance, Decreased mobility, Decreased range of motion, Decreased strength, Hypomobility, Increased muscle spasms, Impaired flexibility, Improper body mechanics, Postural dysfunction, Pain  Visit Diagnosis: Pain in thoracic spine  Abnormal posture  Muscle weakness (generalized)  Unsteadiness on feet     Problem List Patient Active Problem List   Diagnosis Date Noted  . Iron deficiency anemia 12/05/2016  . SIADH (syndrome of inappropriate ADH production) ([facility]   . Polypharmacy 09/14/2016  . Muscle weakness (generalized)   . Syncope 09/10/2016  . Rib pain on left side   . Hypersomnia 02/16/2016  . Acute bronchitis 02/16/2016  . Exertional dyspnea 12/20/2015  . HLD (hyperlipidemia) 11/23/2014  . Change in bowel habits 11/10/2014  . Abdominal pain, left lower quadrant 11/10/2014  . Emphysema lung ([place] 11/08/2014  . Helicobacter pylori gastritis 07/30/2014  . Acute on chronic respiratory failure with hypoxia ([place] 04/10/2014  . Wrist fracture  04/09/2014  . 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 T12 burst fracture ([place] 01/13/2014  . Constipation 12/14/2013  .  Acute respiratory failure with hypoxia ([place]) 12/14/2013  . Hyponatremia 12/14/2013  . COPD (chronic obstructive pulmonary disease) ([place]) 12/14/2013  . Compression fracture of thoracic vertebra ([place]) 12/14/2013  . Hypoxia 12/13/2013  . Compression fracture 12/13/2013  . Diastolic dysfunction   . 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 Dyspepsia 02/11/2012  . Aneurysm ([place]) 02/11/2012  . Trigeminal neuralgia   . Epigastric pain 02/21/2011  . Esophageal dysphagia 02/21/2011  . Adenocarcinoma of lung ([place]) 12/07/2010  . CONTRAST DYE ALLERGY 12/30/2009   Ihor Austin, LPTA; [facility]  Aldona Lento 08/06/2017, 11:37 AM  [place], [place], [address] Phone: [phone]   Fax:  [phone]  Name: SAPIR LAVEY MRN: [id number] Date of Birth: [birth year]

## 2017-08-09 ENCOUNTER — Encounter (HOSPITAL_COMMUNITY): Payer: Self-pay

## 2017-08-09 ENCOUNTER — Ambulatory Visit (HOSPITAL_COMMUNITY): Payer: PPO | Attending: Family Medicine

## 2017-08-09 DIAGNOSIS — M6281 Muscle weakness (generalized): Secondary | ICD-10-CM | POA: Diagnosis not present

## 2017-08-09 DIAGNOSIS — R293 Abnormal posture: Secondary | ICD-10-CM | POA: Diagnosis not present

## 2017-08-09 DIAGNOSIS — R2681 Unsteadiness on feet: Secondary | ICD-10-CM | POA: Insufficient documentation

## 2017-08-09 DIAGNOSIS — M546 Pain in thoracic spine: Secondary | ICD-10-CM | POA: Diagnosis not present

## 2017-08-09 NOTE — Therapy (Signed)
[place], [place], [address] Phone: [phone]   Fax:  [phone]  Physical Therapy Treatment  Patient Details  Name: [name] MRN: [id number] Date of Birth: Dec 25, 1940 Referring Provider: Jenna Luo, MD  Encounter Date: 08/09/2017      PT End of Session - 08/09/17 0951    Visit Number 10   Number of Visits 13   Date for PT Re-Evaluation 08/15/17   Authorization Type [employer]; Gcode complete on 10/23 visit #7   Authorization Time Period 07/05/17 to 08/16/17   Authorization - Visit Number 10   Authorization - Number of Visits 17   PT Start Time 0948   PT Stop Time 1032   PT Time Calculation (min) 44 min   Equipment Utilized During Treatment Gait belt   Activity Tolerance Patient tolerated treatment well;No increased pain   Behavior During Therapy WFL for tasks assessed/performed      Past Medical History:  Diagnosis Date  . Adenocarcinoma of lung ([place]) 12/2010   left lung/surg only  . Allergic rhinitis   . 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 Anxiety disorder   . Aortic aneurysm ([place])   . Complication of anesthesia 2011   bloodpressure dropped during colonoscopy, not problems since  . Compression fracture   . COPD (chronic obstructive pulmonary disease) ([place])   . Depression   . Diastolic dysfunction   . Diverticulosis   . Dysphagia   . Emphysema lung ([place]) 11/08/2014  . Hiatal hernia   . Hypothyroidism   . IBS (irritable bowel syndrome)   . Iron deficiency anemia due to chronic blood loss 12/05/2016  . On home O2 12/15/2013   chronic hypoxia  . PUD (peptic ulcer disease)    72yr  . Shortness of breath    with exertion  . SIADH (syndrome of inappropriate ADH production) ([place]   . Trigeminal neuralgia     Past Surgical History:  Procedure Laterality Date  . ABDOMINAL HYSTERECTOMY    . BACK SURGERY  January 13, 2014  . CATARACT EXTRACTION     . CATARACT EXTRACTION W/PHACO  09/02/2012   Procedure: CATARACT EXTRACTION PHACO AND INTRAOCULAR LENS PLACEMENT (IOC);  Surgeon: MElta GuadeloupeT. SGershon Crane MD;  Location: [facility];  Service: Ophthalmology;  Laterality: Left;  CDE:10.35  . CHOLECYSTECTOMY    . 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 COLONOSCOPY N/A 11/23/2014   Procedure: COLONOSCOPY;  Surgeon: SDanie Binder MD;  Location: [facility];  Service: Endoscopy;  Laterality: N/A;  930   . ENDARTERECTOMY Right 08/21/2013   Procedure: RIGHT CAROTID ANEURYSM RESECTION;  Surgeon: TRosetta Posner MD;  [facility];  Service: Vascular;  Laterality: Right;  . ESOPHAGOGASTRODUODENOSCOPY  11/13/2009   w/dilation to 142m gastric ulceration (H.Pylori) s/p treatment  . ESOPHAGOGASTRODUODENOSCOPY  11/21/2009   distal esophageal web, gastritis  . ESOPHAGOGASTRODUODENOSCOPY  03/14/12   SLHEN:IDPOEUMPNn the distal esophagus/Mild gastritis/small HH. + H.pylori, prescribed Pylera. Finished treatment.   . fatty tumor removal from lt groin    . FRACTURE SURGERY Left    hand and left ring finger  . LUNG CANCER SURGERY  12/2010   Left VATS, minithoracotomy, LLL superior segmentectomy  . ORIF WRIST FRACTURE Left 04/09/2014   Procedure: OPEN REDUCTION INTERNAL FIXATION (ORIF) WRIST FRACTURE;  Surgeon: TiRenette ButtersMD;  [facility] Service: Orthopedics;  Laterality: Left;  .  PERCUTANEOUS PINNING Left 04/09/2014   Procedure: PERCUTANEOUS PINNING EXTREMITY;  Surgeon: Renette Butters, MD;  Location: [facility];  Service: Orthopedics;  Laterality: Left;  . TUBAL LIGATION    . vocal cord surgery  02/06/2011   laryngoscopy with bilateral vocal cord Radiesse injection for vocal cord paralysis  . YAG LASER APPLICATION Left 24/82/5003   Procedure: YAG LASER APPLICATION;  Surgeon: Rutherford Guys, MD;  Location: [facility];  Service: Ophthalmology;  Laterality: Left;    There were no vitals filed for this visit.      Subjective  Assessment - 08/09/17 0951    Subjective Pt stated she is feeling good today, no reports of pain.     Patient Stated Goals walk without a RW and get over fear of falling   Currently in Pain? No/denies                         [facility] Adult PT Treatment/Exercise - 08/09/17 0001      Lumbar Exercises: Standing   Scapular Retraction 15 reps;Theraband   Theraband Level (Scapular Retraction) Level 3 (Green)   Row Theraband;15 reps   Theraband Level (Row) Level 3 (Green)   Shoulder Extension 15 reps;Theraband   Theraband Level (Shoulder Extension) Level 3 (Green)   Other Standing Lumbar Exercises march with pulldown with RTB 5x3" holds each; SLS with vectors x3RT each   Other Standing Lumbar Exercises y at wall 10x3", wall push ups 10x (cueing for form); tandem stance with palov press with RTB 10x each position; sidestep with RTB 2RT                  PT Short Term Goals - 07/30/17 1048      PT SHORT TERM GOAL #1   Title Pt will be independent with HEP and perform consistently in order to maximize overall function.    Baseline Pt reports completing daily    Time 3   Period Weeks   Status On-going     PT SHORT TERM GOAL #2   Title Pt will have at least 4+/5 MMT of all muscle groups tested in order to maximize gait and decrease risk for falls.   Baseline Improvement hip flexion; bilateral hip abduction 4/5   Time 3   Period Weeks   Status Partially Met     PT SHORT TERM GOAL #3   Title Pt will be able to demonstrate proper setup and use of lumbar roll while sitting to maximize posture and decrease pain.   Baseline Pt knowledgeable in support for low back in seated.    Time 3   Period Weeks   Status Achieved           PT Long Term Goals - 07/05/17 1216      PT LONG TERM GOAL #1   Title Pt will report an improved tolerance to standing and walking by at least 50% (to at least 30 mins standing and 10 mins walking) to maximize her ability to perform chores  at home.    Time 6   Period Weeks   Status New   Target Date 08/16/17     PT LONG TERM GOAL #2   Title Pt will be able to perform SLS on BLE for at least 10 sec to maximize gait and balance.    Time 6   Period Weeks   Status New     PT LONG TERM GOAL #3   Title Pt will  have an improved 6MWT by 114f or > and with LRAD to demo improve overall tolerance to movement in order to maximize participation at home.    Time 6   Period Weeks   Status New     PT LONG TERM GOAL #4   Title Pt will score 52/56 on BERG to demo improved balance and decrease risk for falls.    Time 6   Period Weeks   Status New               Plan - 08/09/17 1526    Clinical Impression Statement Continued session focus with spinal mobility, postural and proximal strengthening and balance training.  Pt continues to require cueing for posture awareness through session to reduce forward rolled shoulders.  Pt able to demonstrate good form with theraband postural strengtheing, given theraband and printout to add to HEP.  Added theraband with sidestep for gluteal strengtheing, cueing to reduce ER with activity.  No reports of pain through session, was lmited by fatigue with activities.   Rehab Potential Fair   PT Frequency 2x / week   PT Duration 6 weeks   PT Treatment/Interventions ADLs/Self Care Home Management;Cryotherapy;Electrical Stimulation;Moist Heat;DME Instruction;Gait training;Stair training;Functional mobility training;Therapeutic activities;Therapeutic exercise;Balance training;Neuromuscular re-education;Patient/family education;Manual techniques;Passive range of motion;Dry needling;Energy conservation;Taping   PT Next Visit Plan Reviewe form/technique with theraband postural strenghtening given as HEP.  Continues focus with dynamic balance, hip abduction strength and posture; continue CKC postural strengthenuing.     PT Home Exercise Plan eval: standing scapular retraction and sitting with lumbar roll;  10/4: decompression 1-5, bridging, sidelying clams; 11/02: theraband posture strengthening.        Patient will benefit from skilled therapeutic intervention in order to improve the following deficits and impairments:  Cardiopulmonary status limiting activity, Decreased activity tolerance, Decreased balance, Decreased endurance, Decreased mobility, Decreased range of motion, Decreased strength, Hypomobility, Increased muscle spasms, Impaired flexibility, Improper body mechanics, Postural dysfunction, Pain  Visit Diagnosis: Pain in thoracic spine  Abnormal posture  Muscle weakness (generalized)  Unsteadiness on feet     Problem List Patient Active Problem List   Diagnosis Date Noted  . Iron deficiency anemia 12/05/2016  . SIADH (syndrome of inappropriate ADH production) ([place]   . Polypharmacy 09/14/2016  . Muscle weakness (generalized)   . Syncope 09/10/2016  . Rib pain on left side   . Hypersomnia 02/16/2016  . Acute bronchitis 02/16/2016  . Exertional dyspnea 12/20/2015  . HLD (hyperlipidemia) 11/23/2014  . Change in bowel habits 11/10/2014  . Abdominal pain, left lower quadrant 11/10/2014  . Emphysema lung ([place] 11/08/2014  . Helicobacter pylori gastritis 07/30/2014  . Acute on chronic respiratory failure with hypoxia ([place] 04/10/2014  . Wrist fracture 04/09/2014  . 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 T12 burst fracture ([place] 01/13/2014  . Constipation 12/14/2013  . Acute respiratory failure with hypoxia ([place] 12/14/2013  . Hyponatremia 12/14/2013  . COPD (chronic obstructive pulmonary disease) ([place] 12/14/2013  . Compression fracture of thoracic vertebra ([place] 12/14/2013  . Hypoxia 12/13/2013  . Compression fracture 12/13/2013  . Diastolic dysfunction   . 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 Dyspepsia 02/11/2012  . Aneurysm ([place] 02/11/2012  .  Trigeminal neuralgia   . Epigastric pain 02/21/2011  . Esophageal dysphagia 02/21/2011  . Adenocarcinoma of lung ([place] 12/07/2010  . CONTRAST DYE ALLERGY 12/30/2009   CIhor Austin LSparta CElizabeth CNickola Major  Tessie Eke 08/09/2017, 4:31 PM  [facility] [address] [place], [place], [address] Phone: [phone]   Fax:  [phone]  Name: KELAIAH ESCALONA MRN: [id number] Date of Birth: [birth year]

## 2017-08-13 ENCOUNTER — Ambulatory Visit (HOSPITAL_COMMUNITY): Payer: PPO

## 2017-08-13 ENCOUNTER — Encounter (HOSPITAL_COMMUNITY): Payer: Self-pay

## 2017-08-13 DIAGNOSIS — M6281 Muscle weakness (generalized): Secondary | ICD-10-CM

## 2017-08-13 DIAGNOSIS — M546 Pain in thoracic spine: Secondary | ICD-10-CM | POA: Diagnosis not present

## 2017-08-13 DIAGNOSIS — R293 Abnormal posture: Secondary | ICD-10-CM

## 2017-08-13 DIAGNOSIS — R2681 Unsteadiness on feet: Secondary | ICD-10-CM

## 2017-08-13 NOTE — Therapy (Signed)
[place], [place], [address] Phone: [phone]   Fax:  [phone]  Physical Therapy Treatment  Patient Details  Name: [name] MRN: [id number] Date of Birth: 1940-12-05 Referring Provider: Jenna Luo, MD   Encounter Date: 08/13/2017  PT End of Session - 08/13/17 0950    Visit Number  11    Number of Visits  13    Date for PT Re-Evaluation  08/15/17    Authorization Type  [employer]; Gcode complete on 10/23 visit #7    Authorization Time Period  07/05/17 to 08/16/17    Authorization - Visit Number  11    Authorization - Number of Visits  13    PT Start Time  0945    PT Stop Time  1033    PT Time Calculation (min)  48 min    Equipment Utilized During Treatment  Gait belt    Activity Tolerance  Patient tolerated treatment well;No increased pain    Behavior During Therapy  WFL for tasks assessed/performed       Past Medical History:  Diagnosis Date  . 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 Allergic rhinitis   . 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 Anxiety disorder   . Aortic aneurysm ([place])   . Complication of anesthesia 2011   bloodpressure dropped during colonoscopy, not problems since  . Compression fracture   . COPD (chronic obstructive pulmonary disease) ([place])   . Depression   . Diastolic dysfunction   . Diverticulosis   . Dysphagia   . Emphysema lung ([place]) 11/08/2014  . Hiatal hernia   . Hypothyroidism   . IBS (irritable bowel syndrome)   . Iron deficiency anemia due to chronic blood loss 12/05/2016  . On home O2 12/15/2013   chronic hypoxia  . PUD (peptic ulcer disease)    54yr  . Shortness of breath    with exertion  . SIADH (syndrome of inappropriate ADH production) ([place]   . Trigeminal neuralgia     Past Surgical History:  Procedure Laterality Date  . ABDOMINAL HYSTERECTOMY    . BACK SURGERY  January 13, 2014  .  CATARACT EXTRACTION    . CHOLECYSTECTOMY    . 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 ESOPHAGOGASTRODUODENOSCOPY  11/13/2009   w/dilation to 1100m gastric ulceration (H.Pylori) s/p treatment  . ESOPHAGOGASTRODUODENOSCOPY  11/21/2009   distal esophageal web, gastritis  . ESOPHAGOGASTRODUODENOSCOPY  03/14/12   SLWPV:[id number] the distal esophagus/Mild gastritis/small HH. + H.pylori, prescribed Pylera. Finished treatment.   . fatty tumor removal from lt groin    . FRACTURE SURGERY Left    hand and left ring finger  . LUNG CANCER SURGERY  12/2010   Left VATS, minithoracotomy, LLL superior segmentectomy  . TUBAL LIGATION    . vocal cord surgery  02/06/2011   laryngoscopy with bilateral vocal cord Radiesse injection for vocal cord paralysis    There were no vitals filed for this visit.  Subjective Assessment - 08/13/17 0949    Subjective  Pt states that she has some tension in her lower back this morning. She rates it 0.5/10.    Patient Stated Goals  walk without a RW and get over fear of falling    Currently in Pain?  Yes    Pain Score  -- 0.5   0.5   Pain  Location  Back    Pain Orientation  Right    Pain Descriptors / Indicators  Aching    Pain Type  Chronic pain    Pain Onset  More than a month ago    Pain Frequency  Constant    Aggravating Factors   walking, standing, sitting for too long    Pain Relieving Factors  laying flat    Effect of Pain on Daily Activities  increases            [facility] Adult PT Treatment/Exercise - 08/13/17 0001      Lumbar Exercises: Stretches   Single Knee to Chest Stretch  2 reps;30 seconds    Single Knee to Chest Stretch Limitations  BLE    Double Knee to Chest Stretch  --    ITB Stretch Limitations  corner stretch 4x15"      Lumbar Exercises: Aerobic   UBE (Upper Arm Bike)  x3 mins retro for posture      Lumbar Exercises: Standing   Scapular Retraction  15 reps;Theraband    Theraband Level (Scapular  Retraction)  Level 3 (Green)    Shoulder Extension  15 reps;Theraband    Theraband Level (Shoulder Extension)  Level 3 (Green)    Shoulder Adduction Limitations  wall push-ups x10 reps    Other Standing Lumbar Exercises  standing hip abd x15 reps each with RTB    Other Standing Lumbar Exercises  SLS with vectors x5RT each on firm; tandem stance and UE flexion with 3# DB x10 reps each; march on foam 5x3" holds each      Lumbar Exercises: Seated   Other Seated Lumbar Exercises  D2 PNF with RTB x15 reps each    Other Seated Lumbar Exercises  band pulls with GTB x15 reps while sitting with lumbar roll           PT Education - 08/13/17 0950    Education provided  Yes       PT Short Term Goals - 07/30/17 1048      PT SHORT TERM GOAL #1   Title  Pt will be independent with HEP and perform consistently in order to maximize overall function.     Baseline  Pt reports completing daily     Time  3    Period  Weeks    Status  On-going      PT SHORT TERM GOAL #2   Title  Pt will have at least 4+/5 MMT of all muscle groups tested in order to maximize gait and decrease risk for falls.    Baseline  Improvement hip flexion; bilateral hip abduction 4/5    Time  3    Period  Weeks    Status  Partially Met      PT SHORT TERM GOAL #3   Title  Pt will be able to demonstrate proper setup and use of lumbar roll while sitting to maximize posture and decrease pain.    Baseline  Pt knowledgeable in support for low back in seated.     Time  3    Period  Weeks    Status  Achieved        PT Long Term Goals - 07/05/17 1216      PT LONG TERM GOAL #1   Title  Pt will report an improved tolerance to standing and walking by at least 50% (to at least 30 mins standing and 10 mins walking) to maximize her ability to perform chores at  home.     Time  6    Period  Weeks    Status  New    Target Date  08/16/17      PT LONG TERM GOAL #2   Title  Pt will be able to perform SLS on BLE for at least 10  sec to maximize gait and balance.     Time  6    Period  Weeks    Status  New      PT LONG TERM GOAL #3   Title  Pt will have an improved 6MWT by 147f or > and with LRAD to demo improve overall tolerance to movement in order to maximize participation at home.     Time  6    Period  Weeks    Status  New      PT LONG TERM GOAL #4   Title  Pt will score 52/56 on BERG to demo improved balance and decrease risk for falls.     Time  6    Period  Weeks    Status  New            Plan - 08/13/17 1036    Clinical Impression Statement  Session focused on postural awareness, scapular strengthening and stabilization as well as hip strengthening and dynamic balance. She demo'd good carry-over of postural theraband exercises. Pt continues to be challenged by balance activities, especially with her RLE. Pt c/o increased tightness in lumbar spine which [facility] pt reported resolution of symptoms following. Pt due for reassessment next visit.    Rehab Potential  Fair    PT Frequency  2x / week    PT Duration  6 weeks    PT Treatment/Interventions  ADLs/Self Care Home Management;Cryotherapy;Electrical Stimulation;Moist Heat;DME Instruction;Gait training;Stair training;Functional mobility training;Therapeutic activities;Therapeutic exercise;Balance training;Neuromuscular re-education;Patient/family education;Manual techniques;Passive range of motion;Dry needling;Energy conservation;Taping    PT Next Visit Plan  reassess    PT Home Exercise Plan  eval: standing scapular retraction and sitting with lumbar roll; 10/4: decompression 1-5, bridging, sidelying clams; 11/02: theraband posture strengthening.      Consulted and Agree with Plan of Care  Patient       Patient will benefit from skilled therapeutic intervention in order to improve the following deficits and impairments:  Cardiopulmonary status limiting activity, Decreased activity tolerance, Decreased balance, Decreased endurance,  Decreased mobility, Decreased range of motion, Decreased strength, Hypomobility, Increased muscle spasms, Impaired flexibility, Improper body mechanics, Postural dysfunction, Pain  Visit Diagnosis: Pain in thoracic spine  Abnormal posture  Muscle weakness (generalized)  Unsteadiness on feet     Problem List Patient Active Problem List   Diagnosis Date Noted  . Iron deficiency anemia 12/05/2016  . SIADH (syndrome of inappropriate ADH production) ([place]   . Polypharmacy 09/14/2016  . Muscle weakness (generalized)   . Syncope 09/10/2016  . Rib pain on left side   . Hypersomnia 02/16/2016  . Acute bronchitis 02/16/2016  . Exertional dyspnea 12/20/2015  . HLD (hyperlipidemia) 11/23/2014  . Change in bowel habits 11/10/2014  . Abdominal pain, left lower quadrant 11/10/2014  . Emphysema lung ([place] 11/08/2014  . Helicobacter pylori gastritis 07/30/2014  . Acute on chronic respiratory failure with hypoxia ([place] 04/10/2014  . Wrist fracture 04/09/2014  . 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 T12 burst fracture ([place] 01/13/2014  . Constipation 12/14/2013  . Acute respiratory failure with hypoxia ([place] 12/14/2013  . Hyponatremia 12/14/2013  .  COPD (chronic obstructive pulmonary disease) ([place]) 12/14/2013  . Compression fracture of thoracic vertebra ([place]) 12/14/2013  . Hypoxia 12/13/2013  . Compression fracture 12/13/2013  . Diastolic dysfunction   . 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 Dyspepsia 02/11/2012  . Aneurysm ([place]) 02/11/2012  . Trigeminal neuralgia   . Epigastric pain 02/21/2011  . Esophageal dysphagia 02/21/2011  . Adenocarcinoma of lung ([place]) 12/07/2010  . CONTRAST DYE ALLERGY 12/30/2009       Geraldine Solar PT, DPT  [facility] [address] [place], [place], [address] Phone: [phone]   Fax:   [phone]  Name: [name] MRN: [id number] Date of Birth: July 10, 1941

## 2017-08-15 ENCOUNTER — Ambulatory Visit (HOSPITAL_COMMUNITY): Payer: PPO

## 2017-08-15 ENCOUNTER — Encounter (HOSPITAL_COMMUNITY): Payer: Self-pay

## 2017-08-15 ENCOUNTER — Encounter: Payer: Self-pay | Admitting: Family Medicine

## 2017-08-15 DIAGNOSIS — M546 Pain in thoracic spine: Secondary | ICD-10-CM | POA: Diagnosis not present

## 2017-08-15 DIAGNOSIS — M6281 Muscle weakness (generalized): Secondary | ICD-10-CM

## 2017-08-15 DIAGNOSIS — R2681 Unsteadiness on feet: Secondary | ICD-10-CM

## 2017-08-15 DIAGNOSIS — R293 Abnormal posture: Secondary | ICD-10-CM

## 2017-08-15 NOTE — Therapy (Signed)
[place], [place], [address] Phone: [phone]   Fax:  [phone]  Physical Therapy Treatment/Discharge Summary  Patient Details  Name: [name] MRN: [id number] Date of Birth: [birth year] Referring Provider: Jenna Luo, MD   Encounter Date: 08/15/2017  PT End of Session - 08/15/17 0942    Visit Number  12    Number of Visits  13    Date for PT Re-Evaluation  08/15/17    Authorization Type  [employer]; Gcode complete on 10/23 visit #7    Authorization Time Period  07/05/17 to 08/16/17    Authorization - Visit Number  12    Authorization - Number of Visits  13    PT Start Time  0948    PT Stop Time  1036    PT Time Calculation (min)  48 min    Equipment Utilized During Treatment  Gait belt    Activity Tolerance  Patient tolerated treatment well;No increased pain    Behavior During Therapy  WFL for tasks assessed/performed       Past Medical History:  Diagnosis Date  . 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 Allergic rhinitis   . 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 Anxiety disorder   . Aortic aneurysm ([place])   . Complication of anesthesia 2011   bloodpressure dropped during colonoscopy, not problems since  . Compression fracture   . COPD (chronic obstructive pulmonary disease) ([place])   . Depression   . Diastolic dysfunction   . Diverticulosis   . Dysphagia   . Emphysema lung ([place]) 11/08/2014  . Hiatal hernia   . Hypothyroidism   . IBS (irritable bowel syndrome)   . Iron deficiency anemia due to chronic blood loss 12/05/2016  . On home O2 12/15/2013   chronic hypoxia  . PUD (peptic ulcer disease)    20yr  . Shortness of breath    with exertion  . SIADH (syndrome of inappropriate ADH production) ([place]   . Trigeminal neuralgia     Past Surgical History:  Procedure Laterality Date  . ABDOMINAL HYSTERECTOMY    . BACK SURGERY   January 13, 2014  . CATARACT EXTRACTION    . CHOLECYSTECTOMY    . 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 ESOPHAGOGASTRODUODENOSCOPY  11/13/2009   w/dilation to 116m gastric ulceration (H.Pylori) s/p treatment  . ESOPHAGOGASTRODUODENOSCOPY  11/21/2009   distal esophageal web, gastritis  . ESOPHAGOGASTRODUODENOSCOPY  03/14/12   SLGLO:VFIEPPIRJn the distal esophagus/Mild gastritis/small HH. + H.pylori, prescribed Pylera. Finished treatment.   . fatty tumor removal from lt groin    . FRACTURE SURGERY Left    hand and left ring finger  . LUNG CANCER SURGERY  12/2010   Left VATS, minithoracotomy, LLL superior segmentectomy  . TUBAL LIGATION    . vocal cord surgery  02/06/2011   laryngoscopy with bilateral vocal cord Radiesse injection for vocal cord paralysis    There were no vitals filed for this visit.  Subjective Assessment - 08/15/17 0943    Subjective  Pt reports feeling well today. She denies any pain.     Patient Stated Goals  walk without a RW and get over fear of falling    Currently in Pain?  No/denies    Pain Onset  More than a month ago         [facility]  PT Assessment - 08/15/17 0001      Assessment   Medical Diagnosis  physical deconditioning    Referring Provider  Jenna Luo, MD    Next MD Visit  07/05/17    Prior Therapy  rehab and HHPT following back surgery      Strength   Right Hip Extension  4+/5    Right Hip ABduction  4/5 was 4-   was 4-   Left Hip ABduction  4+/5 was 4   was 4   Right Ankle Dorsiflexion  5/5 was4+   was4+     6 Minute Walk- Baseline   6 Minute Walk- Baseline  yes was 623f; 11/8: 912f  was 67836f11/8: 918f23f  Balance   Balance Assessed  Yes      Static Standing Balance   Static Standing - Balance Support  No upper extremity supported    Static Standing Balance -  Activities   Single Leg Stance - Right Leg;Single Leg Stance - Left Leg    Static Standing - Comment/# of Minutes  R: 8 sec or < L:  7 sec or <      Standardized Balance Assessment   Standardized Balance Assessment  Berg Balance Test      Berg Balance Test   Sit to Stand  Able to stand without using hands and stabilize independently    Standing Unsupported  Able to stand safely 2 minutes    Sitting with Back Unsupported but Feet Supported on Floor or Stool  Able to sit safely and securely 2 minutes    Stand to Sit  Sits safely with minimal use of hands    Transfers  Able to transfer safely, minor use of hands    Standing Unsupported with Eyes Closed  Able to stand 10 seconds safely    Standing Ubsupported with Feet Together  Able to place feet together independently and stand 1 minute safely    From Standing, Reach Forward with Outstretched Arm  Can reach confidently >25 cm (10")    From Standing Position, Pick up Object from Floor  Able to pick up shoe safely and easily    From Standing Position, Turn to Look Behind Over each Shoulder  Looks behind one side only/other side shows less weight shift    Turn 360 Degrees  Able to turn 360 degrees safely but slowly    Standing Unsupported, Alternately Place Feet on Step/Stool  Able to stand independently and safely and complete 8 steps in 20 seconds    Standing Unsupported, One Foot in [place] foot tandem independently and hold 30 seconds    Standing on One Leg  Able to lift leg independently and hold 5-10 seconds    Total Score  52           PT Education - 08/15/17 0942    Education provided  Yes    Education Details  discharge plans, updated HEP    Person(s) Educated  Patient    Methods  Explanation;Handout    Comprehension  Verbalized understanding       PT Short Term Goals - 08/15/17 0952      PT SHORT TERM GOAL #1   Title  Pt will be independent with HEP and perform consistently in order to maximize overall function.     Baseline  11/8: Pt reports completing daily     Time  3    Period  Weeks  Status  Achieved      PT SHORT TERM GOAL #2    Title  Pt will have at least 4+/5 MMT of all muscle groups tested in order to maximize gait and decrease risk for falls.    Baseline  11/8: Improvement hip flexion; bilateral hip abduction 4/5    Time  3    Period  Weeks    Status  Partially Met      PT SHORT TERM GOAL #3   Title  Pt will be able to demonstrate proper setup and use of lumbar roll while sitting to maximize posture and decrease pain.    Baseline  11/8: Pt knowledgeable in support for low back in seated.     Time  3    Period  Weeks    Status  Achieved        PT Long Term Goals - 08/15/17 8466      PT LONG TERM GOAL #1   Title  Pt will report an improved tolerance to standing and walking by at least 50% (to at least 30 mins standing and 10 mins walking) to maximize her ability to perform chores at home.     Baseline  11/8: 10 mins standing; 10 mins walking with rollator, but feels that she could probably walk further    Time  6    Period  Weeks    Status  Partially Met      PT LONG TERM GOAL #2   Title  Pt will be able to perform SLS on BLE for at least 10 sec to maximize gait and balance.     Baseline  11/8: R: 8 sec or <, L: 7 sec or <    Time  6    Period  Weeks    Status  On-going      PT LONG TERM GOAL #3   Title  Pt will have an improved 6MWT by 169f or > and with LRAD to demo improve overall tolerance to movement in order to maximize participation at home.     Baseline  11/8: 912fwith rollator; was 67828f  Time  6    Period  Weeks    Status  Achieved      PT LONG TERM GOAL #4   Title  Pt will score 52/56 on BERG to demo improved balance and decrease risk for falls.     Baseline  11/8: 52/56    Time  6    Period  Weeks    Status  Achieved            Plan - 08/15/17 1039    Clinical Impression Statement  PT reassessed pt's goals and outcome measures this date. Pt has made tremendous progress towards all of her goals as illustrated above. Pt's only remaining limiting factors is her SLS  and her tolerance to standing statically. Pt's R hip abd is still 4/5, but all other MMT is at least 4+/5 and she scored 52/56 on the BERG putting her WNL for her balance. At this time, pt is safe for discharge due to progress made. She was given updated HEP to continue to address balance and strengthening. She was encouraged to attend her local gym and return to her PLOF; she verbalized understanding.    Rehab Potential  Fair    PT Frequency  2x / week    PT Duration  6 weeks    PT Treatment/Interventions  ADLs/Self Care Home Management;Cryotherapy;Electrical Stimulation;Moist  Heat;DME Instruction;Gait training;Stair training;Functional mobility training;Therapeutic activities;Therapeutic exercise;Balance training;Neuromuscular re-education;Patient/family education;Manual techniques;Passive range of motion;Dry needling;Energy conservation;Taping    PT Next Visit Plan  discharged.    PT Home Exercise Plan  eval: standing scapular retraction and sitting with lumbar roll; 10/4: decompression 1-5, bridging, sidelying clams; 11/02: theraband posture strengthening; 08-25-2023: STS, sidestep with band, standing hip abd, SLS, tandem stance    Consulted and Agree with Plan of Care  Patient       Patient will benefit from skilled therapeutic intervention in order to improve the following deficits and impairments:  Cardiopulmonary status limiting activity, Decreased activity tolerance, Decreased balance, Decreased endurance, Decreased mobility, Decreased range of motion, Decreased strength, Hypomobility, Increased muscle spasms, Impaired flexibility, Improper body mechanics, Postural dysfunction, Pain  Visit Diagnosis: Pain in thoracic spine  Abnormal posture  Muscle weakness (generalized)  Unsteadiness on feet   G-Codes - 2017/08/24 1044    Functional Assessment Tool Used (Outpatient Only)  clinical judgement, 5xSTS, TUG, SLS, MMT    Functional Limitation  Mobility: Walking and moving around    Mobility:  Walking and Moving Around Goal Status [phone])  At least 20 percent but less than 40 percent impaired, limited or restricted    Mobility: Walking and Moving Around Discharge Status [phone])  At least 1 percent but less than 20 percent impaired, limited or restricted       Problem List Patient Active Problem List   Diagnosis Date Noted  . Iron deficiency anemia 12/05/2016  . SIADH (syndrome of inappropriate ADH production) ([place])   . Polypharmacy 09/14/2016  . Muscle weakness (generalized)   . Syncope 09/10/2016  . Rib pain on left side   . Hypersomnia 02/16/2016  . Acute bronchitis 02/16/2016  . Exertional dyspnea 12/20/2015  . HLD (hyperlipidemia) 11/23/2014  . Change in bowel habits 11/10/2014  . Abdominal pain, left lower quadrant 11/10/2014  . Emphysema lung ([place]) 11/08/2014  . Helicobacter pylori gastritis 07/30/2014  . Acute on chronic respiratory failure with hypoxia ([place]) 04/10/2014  . Wrist fracture 04/09/2014  . 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 T12 burst fracture ([place]) 01/13/2014  . Constipation 12/14/2013  . Acute respiratory failure with hypoxia ([place]) 12/14/2013  . Hyponatremia 12/14/2013  . COPD (chronic obstructive pulmonary disease) ([place]) 12/14/2013  . Compression fracture of thoracic vertebra ([place]) 12/14/2013  . Hypoxia 12/13/2013  . Compression fracture 12/13/2013  . Diastolic dysfunction   . 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 Dyspepsia 02/11/2012  . Aneurysm ([place]) 02/11/2012  . Trigeminal neuralgia   . Epigastric pain 02/21/2011  . Esophageal dysphagia 02/21/2011  . Adenocarcinoma of lung ([place]) 12/07/2010  . CONTRAST DYE ALLERGY 12/30/2009     PHYSICAL THERAPY DISCHARGE SUMMARY  Visits from Start of Care: 12  Current functional level related to goals / functional outcomes: See clinical impression above   Remaining deficits: See   Clinical impression above   Education / Equipment: HEP Plan: Patient agrees to discharge.  Patient goals were met. Patient is being discharged due to meeting the stated rehab goals.  ?????       Geraldine Solar PT, [facility] [address] [place], [place], [address] Phone: [phone]   Fax:  [phone]  Name: [name] MRN: [id number] Date of Birth: [birth year]

## 2017-08-15 NOTE — Patient Instructions (Addendum)
  SIT TO STAND - NO SUPPORT  Start by scooting close to the front of the chair.  Next, lean forward at your trunk and reach forward with your arms and rise to standing without using your hands to push off from the chair or other object.   Use your arms as a counter-balance by reaching forward when in sitting and lower them as you approach standing.   Perform 1x/day, 2-3 sets of 10 reps    lateral walk at pole or counter   Standing at a support surface (role or counter top) use hands to assist with balance. Perform sideways walking down and back until you feel that you need to take break.   It may be help to place a chair close to allow for safe rest breaks.   Add band around knees or ankles to increase difficulty  Perform 1x/day, 2-3 sets of 10 reps     LOOPED ELASTIC BAND HIP ABDUCTION  While standing with an elastic band looped around your ankles, move the target leg out to the side as shown.   Perform 1x/day, 2-3 sets of 10 reps      SINGLE LEG STANCE - SLS  Stand on one leg and maintain your balance.  Perform 1x/day, 2-3 sets of 10 reps holding for as long as you can on each leg  TANDEM STANCE WITH SUPPORT  Stand in front of a chair, table or counter top for support. Then place the heel of one foot so that it is touching the toes of the other foot. Maintain your balance in this position.   Perform 1x/day, 2-3 sets of 10 reps holding for as long as you can on each leg

## 2017-08-28 ENCOUNTER — Telehealth (HOSPITAL_COMMUNITY): Payer: Self-pay | Admitting: Family Medicine

## 2017-08-28 NOTE — Telephone Encounter (Signed)
08/28/17  Pt had call about her account because her insurance company had sent a letter that charges had been denied because of a Code DC135.  She wanted to know what that code was and I sent the information to my manager and she didn't know.  I left a message for the patient that her September bill was pain in full and there was a balance for October, looks like the insurance hasn't paid yet.  I gave the number for billing dept in [place], [place], let her know she can call that number and they can help maybe help with that code.

## 2017-09-02 ENCOUNTER — Ambulatory Visit (HOSPITAL_COMMUNITY): Payer: PPO

## 2017-09-04 ENCOUNTER — Encounter (HOSPITAL_COMMUNITY): Payer: Self-pay

## 2017-09-04 ENCOUNTER — Ambulatory Visit (HOSPITAL_COMMUNITY)
Admission: RE | Admit: 2017-09-04 | Discharge: 2017-09-04 | Disposition: A | Discharge status: Home / Self Care | Payer: PPO | Source: Ambulatory Visit | Attending: Family Medicine | Admitting: Family Medicine

## 2017-09-04 DIAGNOSIS — Z1231 Encounter for screening mammogram for malignant neoplasm of breast: Secondary | ICD-10-CM | POA: Insufficient documentation

## 2017-09-04 DIAGNOSIS — R0902 Hypoxemia: Secondary | ICD-10-CM | POA: Diagnosis not present

## 2017-09-04 DIAGNOSIS — R0602 Shortness of breath: Secondary | ICD-10-CM | POA: Diagnosis not present

## 2017-09-04 DIAGNOSIS — J9621 Acute and chronic respiratory failure with hypoxia: Secondary | ICD-10-CM | POA: Diagnosis not present

## 2017-09-04 DIAGNOSIS — C3490 Malignant neoplasm of unspecified part of unspecified bronchus or lung: Secondary | ICD-10-CM | POA: Diagnosis not present

## 2017-10-03 ENCOUNTER — Telehealth: Payer: Self-pay | Admitting: *Deleted

## 2017-10-03 MED ORDER — CEPHALEXIN 500 MG PO CAPS: 500.0000 mg | ORAL_CAPSULE | Freq: Two times a day (BID) | ORAL | 0 refills | Status: DC

## 2017-10-03 NOTE — Telephone Encounter (Signed)
cipro 250 bid for 3 days

## 2017-10-03 NOTE — Telephone Encounter (Signed)
Per patient chart, allergy to Cipro and Sulfa noted.   Please advise.

## 2017-10-03 NOTE — Telephone Encounter (Signed)
Received call from patient.   States that she thinks she is developing UTI, but is unable to come to office for visit D/T sick relative.   States that she has lower abd pain, urinary urgency and frequency with some incontinence. Reports that she does have Dx of interstitial cystitis, but has not seen any fever/hematuria.  Requested MD to send ABTx to [facility] to have delivered.  if approved.

## 2017-10-03 NOTE — Telephone Encounter (Signed)
Per MD, give Keflex 500mg  PO BID x5 days.   Call placed to patient and patient made aware.   Prescription sent to pharmacy.

## 2017-10-31 ENCOUNTER — Encounter: Payer: Self-pay | Admitting: Family Medicine

## 2017-10-31 ENCOUNTER — Other Ambulatory Visit: Payer: Self-pay | Admitting: Family Medicine

## 2017-10-31 MED ORDER — CITALOPRAM HYDROBROMIDE 40 MG PO TABS: 20.0000 mg | ORAL_TABLET | Freq: Every day | ORAL | 4 refills | Status: DC

## 2017-10-31 MED ORDER — LEVOTHYROXINE SODIUM 50 MCG PO TABS: 50.0000 ug | ORAL_TABLET | Freq: Every day | ORAL | 3 refills | Status: DC

## 2017-10-31 NOTE — Telephone Encounter (Signed)
Ok to refill??  Last office visit 07/05/2017.  Last refill 05/21/2017, #2 refills.

## 2017-11-05 ENCOUNTER — Encounter: Payer: Self-pay | Admitting: Family Medicine

## 2017-11-13 ENCOUNTER — Other Ambulatory Visit (HOSPITAL_COMMUNITY): Payer: Self-pay | Admitting: *Deleted

## 2017-11-13 DIAGNOSIS — C3490 Malignant neoplasm of unspecified part of unspecified bronchus or lung: Secondary | ICD-10-CM

## 2017-11-14 ENCOUNTER — Inpatient Hospital Stay (HOSPITAL_COMMUNITY): Payer: PPO | Attending: Oncology

## 2017-11-14 ENCOUNTER — Other Ambulatory Visit: Payer: Self-pay

## 2017-11-14 ENCOUNTER — Inpatient Hospital Stay (HOSPITAL_COMMUNITY): Payer: PPO | Attending: Internal Medicine | Admitting: Internal Medicine

## 2017-11-14 ENCOUNTER — Encounter (HOSPITAL_COMMUNITY): Payer: Self-pay | Admitting: Internal Medicine

## 2017-11-14 VITALS — BP 112/67 | HR 71 | Temp 98.5°F | Resp 16 | Ht 62.0 in | Wt 125.0 lb

## 2017-11-14 DIAGNOSIS — R1310 Dysphagia, unspecified: Secondary | ICD-10-CM

## 2017-11-14 DIAGNOSIS — J449 Chronic obstructive pulmonary disease, unspecified: Secondary | ICD-10-CM | POA: Diagnosis not present

## 2017-11-14 DIAGNOSIS — M810 Age-related osteoporosis without current pathological fracture: Secondary | ICD-10-CM | POA: Diagnosis not present

## 2017-11-14 DIAGNOSIS — I739 Peripheral vascular disease, unspecified: Secondary | ICD-10-CM | POA: Diagnosis not present

## 2017-11-14 DIAGNOSIS — Z87891 Personal history of nicotine dependence: Secondary | ICD-10-CM

## 2017-11-14 DIAGNOSIS — M549 Dorsalgia, unspecified: Secondary | ICD-10-CM | POA: Diagnosis not present

## 2017-11-14 DIAGNOSIS — D509 Iron deficiency anemia, unspecified: Secondary | ICD-10-CM | POA: Diagnosis not present

## 2017-11-14 DIAGNOSIS — G8929 Other chronic pain: Secondary | ICD-10-CM | POA: Diagnosis not present

## 2017-11-14 DIAGNOSIS — C3490 Malignant neoplasm of unspecified part of unspecified bronchus or lung: Secondary | ICD-10-CM

## 2017-11-14 DIAGNOSIS — R1312 Dysphagia, oropharyngeal phase: Secondary | ICD-10-CM

## 2017-11-14 DIAGNOSIS — Z85118 Personal history of other malignant neoplasm of bronchus and lung: Secondary | ICD-10-CM | POA: Diagnosis not present

## 2017-11-14 DIAGNOSIS — R200 Anesthesia of skin: Secondary | ICD-10-CM | POA: Diagnosis not present

## 2017-11-14 LAB — CBC WITH DIFFERENTIAL/PLATELET
Basophils Absolute: 0 10*3/uL (ref 0.0–0.1)
Basophils Relative: 1 %
Eosinophils Absolute: 0.1 10*3/uL (ref 0.0–0.7)
Eosinophils Relative: 2 %
HCT: 40.6 % (ref 36.0–46.0)
Hemoglobin: 13.3 g/dL (ref 12.0–15.0)
Lymphocytes Relative: 24 %
Lymphs Abs: 1.3 10*3/uL (ref 0.7–4.0)
MCH: 29.6 pg (ref 26.0–34.0)
MCHC: 32.8 g/dL (ref 30.0–36.0)
MCV: 90.4 fL (ref 78.0–100.0)
Monocytes Absolute: 0.5 10*3/uL (ref 0.1–1.0)
Monocytes Relative: 10 %
Neutro Abs: 3.5 10*3/uL (ref 1.7–7.7)
Neutrophils Relative %: 63 %
Platelets: 339 10*3/uL (ref 150–400)
RBC: 4.49 MIL/uL (ref 3.87–5.11)
RDW: 13.3 % (ref 11.5–15.5)
WBC: 5.5 10*3/uL (ref 4.0–10.5)

## 2017-11-14 LAB — COMPREHENSIVE METABOLIC PANEL
ALT: 17 U/L (ref 14–54)
AST: 21 U/L (ref 15–41)
Albumin: 4.5 g/dL (ref 3.5–5.0)
Alkaline Phosphatase: 38 U/L (ref 38–126)
Anion gap: 11 (ref 5–15)
BUN: 10 mg/dL (ref 6–20)
CO2: 26 mmol/L (ref 22–32)
Calcium: 9.7 mg/dL (ref 8.9–10.3)
Chloride: 96 mmol/L — ABNORMAL LOW (ref 101–111)
Creatinine, Ser: 0.49 mg/dL (ref 0.44–1.00)
GFR calc Af Amer: >60 mL/min (ref >60)
GFR calc non Af Amer: >60 mL/min (ref >60)
Glucose, Bld: 100 mg/dL — ABNORMAL HIGH (ref 65–99)
Potassium: 4.3 mmol/L (ref 3.5–5.1)
Sodium: 133 mmol/L — ABNORMAL LOW (ref 135–145)
Total Bilirubin: 0.7 mg/dL (ref 0.3–1.2)
Total Protein: 7.2 g/dL (ref 6.5–8.1)

## 2017-11-14 LAB — IRON AND TIBC
Iron: 49 ug/dL (ref 28–170)
Saturation Ratios: 11 % (ref 10.4–31.8)
TIBC: 435 ug/dL (ref 250–450)
UIBC: 386 ug/dL

## 2017-11-14 LAB — FERRITIN: Ferritin: 10 ng/mL — ABNORMAL LOW (ref 11–307)

## 2017-11-14 NOTE — Progress Notes (Signed)
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   HISTORY OF PRESENT ILLNESS: Today, she returns continuing to have chronic back pain.  She  is now off of oxygen her energy level have improved 75% appetite has returned to 100% normal.  She continues to have some numbness and burning involving the right foot and has trouble chewing and swallowing to solid foods.  She kept her weight stable though.  And continues to follow with her primary care physician on a regular basis.  Her past medical history is significant for COPD, peripheral vascular disease and compression fracture of the thoracic vertebra. She is status post carotid endarterectomy for carotid artery aneurysm. She quit smoking.  She denies any change in her bowel habits no melena, hematochezia.   ROS all other systems review is negative. Physical Exam  Constitutional: She is oriented to person, place, and time. She appears well-developed and well-nourished. No distress.  HENT:  Head: Normocephalic and atraumatic.  Eyes: Conjunctivae and EOM are normal. Pupils are equal, round, and reactive to light.  Neck: Normal range of motion. Neck supple. No thyromegaly present.  Cardiovascular: Normal rate and regular rhythm.  Pulmonary/Chest: Effort normal. No stridor. No respiratory distress. She has no wheezes. She has no rales.  Abdominal: Soft. Bowel sounds are normal. She exhibits no distension.  Musculoskeletal: Normal range of motion. She exhibits no edema, tenderness or deformity.  Lymphadenopathy:    She has no cervical adenopathy.  Neurological: She is alert and oriented to person, place, and time. A sensory deficit (Right foot) is present.  Skin: No rash noted. No pallor.  Psychiatric: She has a normal mood and affect. Her behavior is normal. Judgment and thought content  normal.    ASSESSMENT AND PLAN:  1. 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 Patient is 6+ years out Without evidence of disease recurrence. 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 Dysphagia to solids-needs workup  Reviewed the CT scan from August 2018 that shows no signs of recurrent disease.  Previously noted groundglass attenuation in the superior segment of the right lower lobe appears smaller.  She does have diffuse bronchial wall thickening with emphysematous changes.  2. Incidentally noted on the CT were atherosclerotic changes involving the left main and left circumflex coronary artery and increasing anterior pericardial fluid of unclear significance-patient is being followed by cardiology and I would defer further management to them.  She is hemodynamically stable and has no chest pain..  I do not suspect the pericardial fluid to be related to malignancy.   3. 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  On the CT from August 2018, there appears to be a new compression fracture of T6 vertebra with 90% loss of anterior vertebral body height in addition to previously known multiple other chronic compression fractures in the thoracolumbar spine.  I would defer further management to primary care.  4. Iron deficiency anemia- Hemoglobin today is normal. Iron panel is pending-we will contact her with results when available.  She stopped taking iron as recommended at her last visit when iron panel was noted to be normal.  Her colonoscopy last was in 2016 which showed moderate diverticulosis  and hemorrhoids.  She was recommended a 10-15-year follow-up colonoscopy.   5.  Patient complains of dysphagia to solids this is been  present for several weeks.  We will refer her to GI for consideration of an EGD.  Plan: Repeat CT scan high resolution noncontrast in August 2019 for surveillance for lung cancer.   Return for follow-up in 1 year, order CT scan prior to next visit. Keep appointments with primary care physician and cardiology.

## 2017-11-20 NOTE — Progress Notes (Signed)
REVIEWED-NO ADDITIONAL RECOMMENDATIONS. 

## 2017-11-21 ENCOUNTER — Encounter: Payer: Self-pay | Admitting: Gastroenterology

## 2017-11-21 ENCOUNTER — Inpatient Hospital Stay (HOSPITAL_COMMUNITY): Payer: PPO

## 2017-11-21 VITALS — BP 119/67 | HR 67 | Temp 97.7°F | Resp 18 | Wt 124.0 lb

## 2017-11-21 DIAGNOSIS — D509 Iron deficiency anemia, unspecified: Secondary | ICD-10-CM | POA: Diagnosis not present

## 2017-11-21 DIAGNOSIS — D508 Other iron deficiency anemias: Secondary | ICD-10-CM

## 2017-11-21 MED ORDER — SODIUM CHLORIDE 0.9 % IV SOLN
Freq: Once | INTRAVENOUS | Status: AC
  Administered 2017-11-21: 10:00:00 via INTRAVENOUS

## 2017-11-21 MED ORDER — SODIUM CHLORIDE 0.9 % IV SOLN
510.0000 mg | Freq: Once | INTRAVENOUS | Status: AC
  Administered 2017-11-21: 510 mg via INTRAVENOUS
  Filled 2017-11-21: qty 17

## 2017-11-21 NOTE — Progress Notes (Signed)
Tolerated infusion w/o adverse reaction.  Alert, in no distress.  VSS.  Discharged ambulatory.  

## 2017-11-22 ENCOUNTER — Telehealth: Payer: Self-pay | Admitting: Family Medicine

## 2017-11-22 NOTE — Telephone Encounter (Signed)
Pt is wanting you to look at her current scans and labs that were done at the cancer center, they told her to go and have an iron transfusion because her iron was low. Just wants to make sure you are in the loop and wants to know if you are concerned at all with any of her results.

## 2017-11-25 NOTE — Telephone Encounter (Signed)
Message sent via my chart

## 2017-11-25 NOTE — Telephone Encounter (Signed)
CT shows a few things l'd like to talk to her about: 3. Aortic atherosclerosis, in addition to left main and left circumflex coronary artery disease. Assessment for potential risk factor modification, dietary therapy or pharmacologic therapy may be warranted, if clinically indicated. 4. Increasing anterior pericardial fluid and/or thickening. This is unlikely to be of hemodynamic significance at this time. No associated pericardial calcification. 5. New (but non acute) compression fracture of T6 with approximately 90% loss of anterior vertebral body height, in addition to multiple other chronic compression fractures of the visualize the thoracolumbar spine.  I'd like to see her at ov.

## 2017-11-26 ENCOUNTER — Telehealth: Payer: Self-pay | Admitting: Gastroenterology

## 2017-11-26 NOTE — Telephone Encounter (Signed)
Opened in error

## 2017-11-27 ENCOUNTER — Encounter: Payer: Self-pay | Admitting: Gastroenterology

## 2017-11-27 ENCOUNTER — Ambulatory Visit: Payer: PPO | Admitting: Gastroenterology

## 2017-11-27 VITALS — BP 118/60 | HR 95 | Temp 97.0°F | Ht 65.0 in | Wt 124.2 lb

## 2017-11-27 DIAGNOSIS — D509 Iron deficiency anemia, unspecified: Secondary | ICD-10-CM

## 2017-11-27 DIAGNOSIS — R1319 Other dysphagia: Secondary | ICD-10-CM | POA: Insufficient documentation

## 2017-11-27 DIAGNOSIS — R1310 Dysphagia, unspecified: Secondary | ICD-10-CM

## 2017-11-27 DIAGNOSIS — K2970 Gastritis, unspecified, without bleeding: Secondary | ICD-10-CM | POA: Diagnosis not present

## 2017-11-27 DIAGNOSIS — B9681 Helicobacter pylori [H. pylori] as the cause of diseases classified elsewhere: Secondary | ICD-10-CM | POA: Diagnosis not present

## 2017-11-27 DIAGNOSIS — R195 Other fecal abnormalities: Secondary | ICD-10-CM | POA: Diagnosis not present

## 2017-11-27 NOTE — Patient Instructions (Signed)
1. We will be in touch regarding upcoming work up. You will need upper endoscopy at minimum but will consider colonoscopy as well.

## 2017-11-27 NOTE — Progress Notes (Addendum)
Primary Care Physician:  [name], MD Referring provider: Creola Corn, MD  Primary Gastroenterologist:  Barney Drain, MD  REVIEWED. EMR REVIEWED FROM 2011 TO PRESENT. PT NEED EGD/DIL FOR DYSPHAGIA. NO NEED FOR TCS. CONSIDER GIVENS CAPSULE ENDOSCOPY.   Chief Complaint  Patient presents with  . Repeat EGD    doing fine    HPI:  [name] is a 77 y.o. female here at the request of Dr. Sherrine Maples [facility]) for further evaluation of dysphagia, IDA, heme positive stool. Last seen in 11/2014 by [place]. She has a history of dysphagia requiring multiple dilations in the past. History of H. pylori failing treatment with Prevpac and Pylera. A third treatment including Rifabutin, Amoxicillin, and Protonix done. Still needs follow up to confirm H.pylori eradication. Last colonoscopy 3 years ago as outlined in [facility].   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 History of iron deficiency anemia. She had iron infusion 11/21/17.  Her Hgb had been in the 10-11 range in 2017 and was down to 9.9 10/2016. Now up to 13.3. Ferritin 59 11/2016 and down to 10 in 11/2017.   BM not daily but 4-5 times per week. Manages naturally with shakes with greens. Intentional 10 pound weight loss. Patient is proud of her daily exercise, 3 miles daily on glider at home. She suffered compression fractures of thoracic spine in 2017 and has since been to rehab and is feeling much stronger. States her oxygen requirements have been decreased as well.   She denies abdominal pain, melena, brbpr. She does have dysphagia to bread, meat, potatoes and has to be very careful if she chooses to eat them. She cannot eat late at night or she develops epigastric cramping. No n/v.   Current Outpatient Medications  Medication Sig Dispense Refill  . acetaminophen (TYLENOL) 650 MG CR tablet Take 1 tablet (650 mg total)  by mouth every 8 (eight) hours as needed for pain. (Patient taking differently: Take 500-650 mg by mouth every 8 (eight) hours as needed for pain. ) 30 tablet 0  . ALPRAZolam (XANAX) 0.5 MG tablet TAKE (1) TABLET BY MOUTH TWICE A DAY AS NEEDED. (Patient taking differently: Take at bed time as needed) 60 tablet 0  . CALCIUM PO Take by mouth daily.    . citalopram (CELEXA) 40 MG tablet Take 0.5 tablets (20 mg total) by mouth at bedtime. 90 tablet 4  . levothyroxine (SYNTHROID, LEVOTHROID) 50 MCG tablet Take 1 tablet (50 mcg total) by mouth daily before breakfast. 90 tablet 3  . magnesium oxide (MAG-OX) 400 MG tablet Take 400 mg by mouth 2 (two) times daily.    . Multiple Vitamin (MULTIVITAMIN WITH MINERALS) TABS tablet Take 1 tablet by mouth daily.    . ondansetron (ZOFRAN) 4 MG tablet Take 1 tablet (4 mg total) by mouth 3 (three) times daily as needed for nausea or vomiting. 90 tablet 0  . umeclidinium-vilanterol (ANORO ELLIPTA) 62.5-25 MCG/INH AEPB Inhale 1 puff into the lungs daily. 90 each 3  . VITAMIN K PO Take by mouth daily.      No current facility-administered medications for this visit.     Allergies as of 11/27/2017 - Review Complete 11/27/2017  Allergen Reaction Noted  . Ciprofloxacin hcl Hives 07/23/2011  . Iohexol  10/31/2010  . Prozac [fluoxetine hcl] Rash 05/11/2011  . Sulfonamide derivatives Hives and Rash     Past Medical  History:  Diagnosis Date  . 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 Allergic rhinitis   . 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 Anxiety disorder   . Aortic aneurysm ([place])   . Complication of anesthesia 2011   bloodpressure dropped during colonoscopy, not problems since  . Compression fracture   . COPD (chronic obstructive pulmonary disease) ([place])   . Depression   . Diastolic dysfunction   . Diverticulosis   . Dysphagia   . Emphysema lung ([place]) 11/08/2014  . Hiatal hernia   . Hypothyroidism    . IBS (irritable bowel syndrome)   . Iron deficiency anemia due to chronic blood loss 12/05/2016  . On home O2 12/15/2013   chronic hypoxia  . PUD (peptic ulcer disease)    20yrs  . Shortness of breath    with exertion  . SIADH (syndrome of inappropriate ADH production) ([place])   . Trigeminal neuralgia     Past Surgical History:  Procedure Laterality Date  . ABDOMINAL HYSTERECTOMY    . BACK SURGERY  January 13, 2014  . CATARACT EXTRACTION    . CATARACT EXTRACTION W/PHACO  09/02/2012   Procedure: CATARACT EXTRACTION PHACO AND INTRAOCULAR LENS PLACEMENT (IOC);  Surgeon: Elta Guadeloupe T. Gershon Crane, MD;  Location: [facility];  Service: Ophthalmology;  Laterality: Left;  CDE:10.35  . CHOLECYSTECTOMY    . 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 COLONOSCOPY N/A 11/23/2014   Dr. Oneida Alar: moderate diverticulosis, hemorrhoids, redundant colon. next TCS in 10-15 years.   Marland Kitchen ENDARTERECTOMY Right 08/21/2013   Procedure: RIGHT CAROTID ANEURYSM RESECTION;  Surgeon: Rosetta Posner, MD;  Location: [facility] OR;  Service: Vascular;  Laterality: Right;  . ESOPHAGOGASTRODUODENOSCOPY  11/13/2009   w/dilation to 28mm, gastric ulceration (H.Pylori) s/p treatment  . ESOPHAGOGASTRODUODENOSCOPY  11/21/2009   distal esophageal web, gastritis  . ESOPHAGOGASTRODUODENOSCOPY  03/14/12   [id number] in the distal esophagus/Mild gastritis/small HH. + H.pylori, prescribed Pylera. Finished treatment.   . fatty tumor removal from lt groin    . FRACTURE SURGERY Left    hand and left ring finger  . LUNG CANCER SURGERY  12/2010   Left VATS, minithoracotomy, LLL superior segmentectomy  . ORIF WRIST FRACTURE Left 04/09/2014   Procedure: OPEN REDUCTION INTERNAL FIXATION (ORIF) WRIST FRACTURE;  Surgeon: Renette Butters, MD;  Location: [place];  Service: Orthopedics;  Laterality: Left;  . PERCUTANEOUS PINNING Left 04/09/2014   Procedure: PERCUTANEOUS PINNING EXTREMITY;  Surgeon: Renette Butters, MD;  Location: [place];   Service: Orthopedics;  Laterality: Left;  . TUBAL LIGATION    . vocal cord surgery  02/06/2011   laryngoscopy with bilateral vocal cord Radiesse injection for vocal cord paralysis  . YAG LASER APPLICATION Left 61/44/3154   Procedure: YAG LASER APPLICATION;  Surgeon: Rutherford Guys, MD;  Location: [facility];  Service: Ophthalmology;  Laterality: Left;    Family History  Problem Relation Age of Onset  . Pulmonary embolism Mother   . Heart attack Father   . Hypertension Father   . Liver cancer Sister   . Cancer Sister   . Cancer Brother   . Cancer Daughter   . Colon cancer Neg Hx     Social History   Socioeconomic History  . Marital status: Divorced    Spouse name: Not on file  . Number of children: Not on file  . Years of education: Not on file  .  Highest education level: Not on file  Social Needs  . Financial resource strain: Not on file  . Food insecurity - worry: Not on file  . Food insecurity - inability: Not on file  . Transportation needs - medical: Not on file  . Transportation needs - non-medical: Not on file  Occupational History  . Not on file  Tobacco Use  . Smoking status: Former Smoker    Packs/day: 0.50    Years: 20.00    Pack years: 10.00    Types: Cigarettes    Last attempt to quit: 12/21/2010    Years since quitting: 6.9  . Smokeless tobacco: Never Used  . Tobacco comment: smoking cessation info given and reviewed   Substance and Sexual Activity  . Alcohol use: Yes    Alcohol/week: 0.0 oz    Comment: seldom  . Drug use: No  . Sexual activity: Yes    Birth control/protection: Surgical  Other Topics Concern  . Not on file  Social History Narrative  . Not on file      ROS:  General: Negative for anorexia, unintentional weight loss, fever, chills, fatigue, weakness. Eyes: Negative for vision changes.  ENT: Negative for hoarseness, difficulty swallowing , nasal congestion. CV: Negative for chest pain, angina, palpitations, dyspnea on exertion,  peripheral edema.  Respiratory: Negative for dyspnea at rest, dyspnea on exertion, cough, sputum, wheezing.  GI: See history of present illness. GU:  Negative for dysuria, hematuria, urinary incontinence, urinary frequency, nocturnal urination.  MS: Negative for joint pain, low back pain.  Derm: Negative for rash or itching.  Neuro: Negative for weakness, abnormal sensation, seizure, frequent headaches, memory loss, confusion.  Psych: Negative for anxiety, depression, suicidal ideation, hallucinations.  Endo: Negative for unusual weight change.  Heme: Negative for bruising or bleeding. Allergy: Negative for rash or hives.    Physical Examination:  BP 118/60   Pulse 95   Temp (!) 97 F (36.1 C) (Oral)   Ht 5\' 5"  (1.651 m)   Wt 124 lb 3.2 oz (56.3 kg)   BMI 20.67 kg/m    General: Well-nourished, well-developed in no acute distress.  Head: Normocephalic, atraumatic.   Eyes: Conjunctiva pink, no icterus. Mouth: Oropharyngeal mucosa moist and pink , no lesions erythema or exudate. Neck: Supple without thyromegaly, masses, or lymphadenopathy.  Lungs: Clear to auscultation bilaterally.  Heart: Regular rate and rhythm, no murmurs rubs or gallops.  Abdomen: Bowel sounds are normal, nontender, nondistended, no hepatosplenomegaly or masses, no abdominal bruits or    hernia , no rebound or guarding.   Rectal: not performed Extremities: No lower extremity edema. No clubbing or deformities.  Neuro: Alert and oriented x 4 , grossly normal neurologically.  Skin: Warm and dry, no rash or jaundice.   Psych: Alert and cooperative, normal mood and affect.  Labs: Lab Results  Component Value Date   IRON 49 11/14/2017   TIBC 435 11/14/2017   FERRITIN 10 (L) 11/14/2017   Ferritin 31 six months ago  Lab Results  Component Value Date   WBC 5.5 11/14/2017   HGB 13.3 11/14/2017   HCT 40.6 11/14/2017   MCV 90.4 11/14/2017   PLT 339 11/14/2017   Lab Results  Component Value Date    CREATININE 0.49 11/14/2017   BUN 10 11/14/2017   NA 133 (L) 11/14/2017   K 4.3 11/14/2017   CL 96 (L) 11/14/2017   CO2 26 11/14/2017   Lab Results  Component Value Date   ALT 17 11/14/2017  AST 21 11/14/2017   ALKPHOS 38 11/14/2017   BILITOT 0.7 11/14/2017     Imaging Studies: No results found.

## 2017-11-28 ENCOUNTER — Encounter: Payer: Self-pay | Admitting: Gastroenterology

## 2017-11-28 NOTE — Progress Notes (Signed)
CC'ED TO PCP 

## 2017-11-28 NOTE — Assessment & Plan Note (Signed)
Very pleasant 77 year old female presenting for further evaluation of iron deficiency, human quote positive stool, dysphagia. Hemoglobin typically in the 10 to 11 range throughout 2017 but dropped to nine range in early 2018. She was started on iron. Her hemoglobin's improved to the normal range however over the past year her ferritin has declined. She received an iron infusion recently. One year ago she was having positive. He also complains of solid food dysphagia. History of esophageal strictures in the past requiring dilation. She also has a history of H. pylori, treated three times due to resistance. She has not had follow-up to make sure it was eradicated.  At minimum she needs an upper endoscopy with esophageal dilation and to follow up on H. pylori. Patient's last colonoscopy was three years ago, she would like to have a colonoscopy along with her upper endoscopy if it's felt to be necessary. To discuss with Dr. Oneida Alar as colonoscopy is greater than 1 years old and likely should be updated due to heme positive stool and decline in ferritin. Further recommendations to follow.  I have discussed the risks, alternatives, benefits with regards to but not limited to the risk of reaction to medication, bleeding, infection, perforation and the patient is agreeable to proceed. Written consent to be obtained.

## 2017-12-03 ENCOUNTER — Encounter: Payer: Self-pay | Admitting: Family Medicine

## 2017-12-03 NOTE — Progress Notes (Signed)
Please let patient know that Dr. Oneida Alar recommends EGD/ED for dysphagia. Schedule if patient agreeable.   No need for colonoscopy right now BUT she may need capsule study but will await EGD findings first.

## 2017-12-04 ENCOUNTER — Other Ambulatory Visit: Payer: Self-pay

## 2017-12-04 ENCOUNTER — Encounter: Payer: Self-pay | Admitting: *Deleted

## 2017-12-04 ENCOUNTER — Telehealth: Payer: Self-pay | Admitting: Gastroenterology

## 2017-12-04 DIAGNOSIS — R1310 Dysphagia, unspecified: Secondary | ICD-10-CM

## 2017-12-04 NOTE — Progress Notes (Signed)
Pt is aware and willing to schedule the EGD/ED.

## 2017-12-04 NOTE — Telephone Encounter (Signed)
Spoke with pt and verbally discussed pt EGD instructions with her. This has also been mailed to her.

## 2017-12-04 NOTE — Progress Notes (Signed)
LMOM to call.

## 2017-12-04 NOTE — Telephone Encounter (Signed)
See separate note. Pt is aware and has been scheduled for procedure.

## 2017-12-04 NOTE — Telephone Encounter (Signed)
Pt called to let MS know that 3/8 at 0730 for her EGD would be ok and she is arranging for transportation.

## 2017-12-04 NOTE — Telephone Encounter (Signed)
Pt was returning a call from DS. [phone]

## 2017-12-04 NOTE — Progress Notes (Signed)
Spoke with pt and EGD scheduled for 12/13/17 at 8:30am.

## 2017-12-13 ENCOUNTER — Ambulatory Visit (HOSPITAL_COMMUNITY)
Admission: RE | Admit: 2017-12-13 | Discharge: 2017-12-13 | Disposition: A | Discharge status: Home / Self Care | Payer: PPO | Source: Ambulatory Visit | Attending: Gastroenterology | Admitting: Gastroenterology

## 2017-12-13 ENCOUNTER — Other Ambulatory Visit: Payer: Self-pay

## 2017-12-13 ENCOUNTER — Encounter (HOSPITAL_COMMUNITY)
Admission: RE | Disposition: A | Discharge status: Home / Self Care | Payer: Self-pay | Source: Ambulatory Visit | Attending: Gastroenterology

## 2017-12-13 ENCOUNTER — Encounter (HOSPITAL_COMMUNITY): Payer: Self-pay | Admitting: *Deleted

## 2017-12-13 DIAGNOSIS — Z888 Allergy status to other drugs, medicaments and biological substances status: Secondary | ICD-10-CM | POA: Insufficient documentation

## 2017-12-13 DIAGNOSIS — Q394 Esophageal web: Secondary | ICD-10-CM | POA: Insufficient documentation

## 2017-12-13 DIAGNOSIS — Z9981 Dependence on supplemental oxygen: Secondary | ICD-10-CM | POA: Insufficient documentation

## 2017-12-13 DIAGNOSIS — E039 Hypothyroidism, unspecified: Secondary | ICD-10-CM | POA: Insufficient documentation

## 2017-12-13 DIAGNOSIS — K2950 Unspecified chronic gastritis without bleeding: Secondary | ICD-10-CM | POA: Insufficient documentation

## 2017-12-13 DIAGNOSIS — B9681 Helicobacter pylori [H. pylori] as the cause of diseases classified elsewhere: Secondary | ICD-10-CM | POA: Diagnosis not present

## 2017-12-13 DIAGNOSIS — Z79899 Other long term (current) drug therapy: Secondary | ICD-10-CM | POA: Diagnosis not present

## 2017-12-13 DIAGNOSIS — R1310 Dysphagia, unspecified: Secondary | ICD-10-CM | POA: Insufficient documentation

## 2017-12-13 DIAGNOSIS — Z8619 Personal history of other infectious and parasitic diseases: Secondary | ICD-10-CM | POA: Insufficient documentation

## 2017-12-13 DIAGNOSIS — Z902 Acquired absence of lung [part of]: Secondary | ICD-10-CM | POA: Insufficient documentation

## 2017-12-13 DIAGNOSIS — I5030 Unspecified diastolic (congestive) heart failure: Secondary | ICD-10-CM | POA: Diagnosis not present

## 2017-12-13 DIAGNOSIS — K449 Diaphragmatic hernia without obstruction or gangrene: Secondary | ICD-10-CM | POA: Insufficient documentation

## 2017-12-13 DIAGNOSIS — F419 Anxiety disorder, unspecified: Secondary | ICD-10-CM | POA: Diagnosis not present

## 2017-12-13 DIAGNOSIS — F329 Major depressive disorder, single episode, unspecified: Secondary | ICD-10-CM | POA: Insufficient documentation

## 2017-12-13 DIAGNOSIS — K2970 Gastritis, unspecified, without bleeding: Secondary | ICD-10-CM

## 2017-12-13 DIAGNOSIS — Z87891 Personal history of nicotine dependence: Secondary | ICD-10-CM | POA: Diagnosis not present

## 2017-12-13 DIAGNOSIS — Z85118 Personal history of other malignant neoplasm of bronchus and lung: Secondary | ICD-10-CM | POA: Diagnosis not present

## 2017-12-13 DIAGNOSIS — Z881 Allergy status to other antibiotic agents status: Secondary | ICD-10-CM | POA: Insufficient documentation

## 2017-12-13 DIAGNOSIS — J449 Chronic obstructive pulmonary disease, unspecified: Secondary | ICD-10-CM | POA: Diagnosis not present

## 2017-12-13 DIAGNOSIS — Z882 Allergy status to sulfonamides status: Secondary | ICD-10-CM | POA: Insufficient documentation

## 2017-12-13 DIAGNOSIS — Z79890 Hormone replacement therapy (postmenopausal): Secondary | ICD-10-CM | POA: Diagnosis not present

## 2017-12-13 DIAGNOSIS — E222 Syndrome of inappropriate secretion of antidiuretic hormone: Secondary | ICD-10-CM | POA: Diagnosis not present

## 2017-12-13 HISTORY — PX: ESOPHAGOGASTRODUODENOSCOPY: SHX5428

## 2017-12-13 HISTORY — PX: SAVORY DILATION: SHX5439

## 2017-12-13 SURGERY — EGD (ESOPHAGOGASTRODUODENOSCOPY)
Anesthesia: Moderate Sedation

## 2017-12-13 MED ORDER — OMEPRAZOLE 20 MG PO CPDR: DELAYED_RELEASE_CAPSULE | ORAL | 3 refills | Status: DC

## 2017-12-13 MED ORDER — LIDOCAINE VISCOUS 2 % MT SOLN
OROMUCOSAL | Status: AC
  Filled 2017-12-13: qty 15

## 2017-12-13 MED ORDER — LIDOCAINE VISCOUS 2 % MT SOLN
OROMUCOSAL | Status: DC | PRN
  Administered 2017-12-13: 1 via OROMUCOSAL

## 2017-12-13 MED ORDER — SODIUM CHLORIDE 0.9 % IV SOLN
INTRAVENOUS | Status: DC
  Administered 2017-12-13: 08:00:00 via INTRAVENOUS

## 2017-12-13 MED ORDER — MIDAZOLAM HCL 5 MG/5ML IJ SOLN
INTRAMUSCULAR | Status: AC
  Filled 2017-12-13: qty 10

## 2017-12-13 MED ORDER — MEPERIDINE HCL 100 MG/ML IJ SOLN
INTRAMUSCULAR | Status: DC | PRN
  Administered 2017-12-13: 25 mg via INTRAVENOUS
  Administered 2017-12-13 (×2): 12.5 mg via INTRAVENOUS

## 2017-12-13 MED ORDER — MIDAZOLAM HCL 5 MG/5ML IJ SOLN
INTRAMUSCULAR | Status: DC | PRN
  Administered 2017-12-13 (×3): 1 mg via INTRAVENOUS

## 2017-12-13 MED ORDER — STERILE WATER FOR IRRIGATION IR SOLN
Status: DC | PRN
  Administered 2017-12-13: 09:00:00

## 2017-12-13 MED ORDER — MINERAL OIL PO OIL
TOPICAL_OIL | ORAL | Status: AC
  Filled 2017-12-13: qty 30

## 2017-12-13 MED ORDER — MEPERIDINE HCL 100 MG/ML IJ SOLN
INTRAMUSCULAR | Status: AC
  Filled 2017-12-13: qty 2

## 2017-12-13 NOTE — Discharge Instructions (Signed)
I dilated your esophagus. You have a stricture near the base of your esophagus.  You have mild gastritis & A small HIATAL HERNIA. I biopsied your stomach.   DRINK WATER TO KEEP YOUR URINE LIGHT YELLOW.  START OMEPRAZOLE.  TAKE 30 MINUTES PRIOR TO YOUR FIRST MEAL.  YOUR BIOPSY RESULTS WILL BE AVAILABLE IN 7 DAYS WITH YOUR RESULTS. YOU WILL LIKELY NEED THE CAMERA PILL STUDY. AFTER THE STUDY IS COMPLETE WE WILL RE-START THE IRON.  FOLLOW UP IN 4 MOS.  UPPER ENDOSCOPY AFTER CARE Read the instructions outlined below and refer to this sheet in the next week. These discharge instructions provide you with general information on caring for yourself after you leave the hospital. While your treatment has been planned according to the most current medical practices available, unavoidable complications occasionally occur. If you have any problems or questions after discharge, call DR. Jennine Peddy, [phone].  ACTIVITY  You may resume your regular activity, but move at a slower pace for the next 24 hours.   Take frequent rest periods for the next 24 hours.   Walking will help get rid of the air and reduce the bloated feeling in your belly (abdomen).   No driving for 24 hours (because of the medicine (anesthesia) used during the test).   You may shower.   Do not sign any important legal documents or operate any machinery for 24 hours (because of the anesthesia used during the test).    NUTRITION  Drink plenty of fluids.   You may resume your normal diet as instructed by your doctor.   Begin with a light meal and progress to your normal diet. Heavy or fried foods are harder to digest and may make you feel sick to your stomach (nauseated).   Avoid alcoholic beverages for 24 hours or as instructed.    MEDICATIONS  You may resume your normal medications.   WHAT YOU CAN EXPECT TODAY  Some feelings of bloating in the abdomen.   Passage of more gas than usual.    IF YOU HAD A BIOPSY  TAKEN DURING THE UPPER ENDOSCOPY:  Eat a soft diet IF YOU HAVE NAUSEA, BLOATING, ABDOMINAL PAIN, OR VOMITING.    FINDING OUT THE RESULTS OF YOUR TEST Not all test results are available during your visit. DR. Oneida Alar WILL CALL YOU WITHIN 14 DAYS OF YOUR PROCEDUE WITH YOUR RESULTS. Do not assume everything is normal if you have not heard from DR. Totiana Everson, CALL HER OFFICE AT [phone].  SEEK IMMEDIATE MEDICAL ATTENTION AND CALL THE OFFICE: [phone] IF:  You have more than a spotting of blood in your stool.   Your belly is swollen (abdominal distention).   You are nauseated or vomiting.   You have a temperature over 101F.   You have abdominal pain or discomfort that is severe or gets worse throughout the day.  Gastritis  Gastritis is an inflammation (the body's way of reacting to injury and/or infection) of the stomach. It is often caused by viral or bacterial (germ) infections. It can also be caused BY ASPIRIN, BC/GOODY POWDER'S, (IBUPROFEN) MOTRIN, OR ALEVE (NAPROXEN), chemicals (including alcohol), SPICY FOODS, and medications. This illness may be associated with generalized malaise (feeling tired, not well), UPPER ABDOMINAL STOMACH cramps, and fever. One common bacterial cause of gastritis is an organism known as H. Pylori. This can be treated with antibiotics.   ESOPHAGEAL STRICTURE  Esophageal strictures can be caused by stomach acid backing up into the tube that carries food from the  mouth down to the stomach (lower esophagus).  TREATMENT There are a number of medicines used to treat reflux/stricture, including: Antacids.  Proton-pump inhibitors: PRILOSEC  HOME CARE INSTRUCTIONS Eat 2-3 hours before going to bed.  Try to reach and maintain a healthy weight.  Do not eat just a few very large meals. Instead, eat 4 TO 6 smaller meals throughout the day.  Try to identify foods and beverages that make your symptoms worse, and avoid these.  Avoid tight clothing.  Do not  exercise right after eating.

## 2017-12-13 NOTE — H&P (Signed)
Primary Care Physician:  [name], MD Primary Gastroenterologist:  Dr. Oneida Alar  Pre-Procedure History & Physical: HPI:  [name] is a 77 y.o. female here for DYSPHAGIA.  Past Medical History:  Diagnosis Date  . 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 Allergic rhinitis   . 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 Anxiety disorder   . Aortic aneurysm ([place])   . Complication of anesthesia 2011   bloodpressure dropped during colonoscopy, not problems since  . Compression fracture   . COPD (chronic obstructive pulmonary disease) ([place])   . Depression   . Diastolic dysfunction   . Diverticulosis   . Dysphagia   . Emphysema lung ([place]) 11/08/2014  . Hiatal hernia   . Hypothyroidism   . IBS (irritable bowel syndrome)   . Iron deficiency anemia due to chronic blood loss 12/05/2016  . On home O2 12/15/2013   chronic hypoxia  . PUD (peptic ulcer disease)    44yrs  . Shortness of breath    with exertion  . SIADH (syndrome of inappropriate ADH production) ([place])   . Trigeminal neuralgia     Past Surgical History:  Procedure Laterality Date  . ABDOMINAL HYSTERECTOMY    . BACK SURGERY  January 13, 2014  . CATARACT EXTRACTION    . CATARACT EXTRACTION W/PHACO  09/02/2012   Procedure: CATARACT EXTRACTION PHACO AND INTRAOCULAR LENS PLACEMENT (IOC);  Surgeon: Elta Guadeloupe T. Gershon Crane, MD;  Location: [facility];  Service: Ophthalmology;  Laterality: Left;  CDE:10.35  . CHOLECYSTECTOMY    . 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 COLONOSCOPY N/A 11/23/2014   Dr. Oneida Alar: moderate diverticulosis, hemorrhoids, redundant colon. next TCS in 10-15 years.   Marland Kitchen ENDARTERECTOMY Right 08/21/2013   Procedure: RIGHT CAROTID ANEURYSM RESECTION;  Surgeon: Rosetta Posner, MD;  Location: [facility] OR;  Service: Vascular;  Laterality: Right;  . ESOPHAGOGASTRODUODENOSCOPY  11/13/2009   w/dilation to 52mm, gastric ulceration (H.Pylori) s/p  treatment  . ESOPHAGOGASTRODUODENOSCOPY  11/21/2009   distal esophageal web, gastritis  . ESOPHAGOGASTRODUODENOSCOPY  03/14/12   [id number] in the distal esophagus/Mild gastritis/small HH. + H.pylori, prescribed Pylera. Finished treatment.   . fatty tumor removal from lt groin    . FRACTURE SURGERY Left    hand and left ring finger  . LUNG CANCER SURGERY  12/2010   Left VATS, minithoracotomy, LLL superior segmentectomy  . ORIF WRIST FRACTURE Left 04/09/2014   Procedure: OPEN REDUCTION INTERNAL FIXATION (ORIF) WRIST FRACTURE;  Surgeon: Renette Butters, MD;  Location: [place];  Service: Orthopedics;  Laterality: Left;  . PERCUTANEOUS PINNING Left 04/09/2014   Procedure: PERCUTANEOUS PINNING EXTREMITY;  Surgeon: Renette Butters, MD;  Location: [place];  Service: Orthopedics;  Laterality: Left;  . TUBAL LIGATION    . vocal cord surgery  02/06/2011   laryngoscopy with bilateral vocal cord Radiesse injection for vocal cord paralysis  . YAG LASER APPLICATION Left 81/19/1478   Procedure: YAG LASER APPLICATION;  Surgeon: Rutherford Guys, MD;  Location: [facility];  Service: Ophthalmology;  Laterality: Left;    Prior to Admission medications   Medication Sig Start Date End Date Taking? Authorizing Provider  acetaminophen (TYLENOL) 325 MG tablet Take 650 mg by mouth every 6 (six) hours as needed (FOR PAIN.).   Yes [provider]  ALPRAZolam (XANAX) 0.5 MG tablet TAKE (1) TABLET BY MOUTH TWICE A DAY AS NEEDED. Patient taking differently:  TAKE 1 TABLET (0.5 MG) BY MOUTH AT BEDTIME 10/31/17  Yes [name], MD  Calcium-Magnesium-Vitamin D (CITRACAL CALCIUM+D) 600-40-500 MG-MG-UNIT TB24 Take 1 tablet by mouth daily.   Yes [provider]  cholecalciferol (VITAMIN D) 1000 units tablet Take 1,000 Units by mouth daily.   Yes [provider]  citalopram (CELEXA) 40 MG tablet Take 0.5 tablets (20 mg total) by mouth at bedtime. 10/31/17  Yes [name], MD  levothyroxine (SYNTHROID,  LEVOTHROID) 50 MCG tablet Take 1 tablet (50 mcg total) by mouth daily before breakfast. 10/31/17  Yes [name], MD  magnesium oxide (MAG-OX) 400 MG tablet Take 400 mg by mouth 2 (two) times daily.   Yes [provider]  Misc Natural Products (SUPER GREENS PO) Take 1 Scoop by mouth every evening.   Yes [provider]  Multiple Vitamin (MULTIVITAMIN WITH MINERALS) TABS tablet Take 1 tablet by mouth daily.   Yes [provider]  ondansetron (ZOFRAN) 4 MG tablet Take 1 tablet (4 mg total) by mouth 3 (three) times daily as needed for nausea or vomiting. 11/14/16  Yes [name], MD  umeclidinium-vilanterol (ANORO ELLIPTA) 62.5-25 MCG/INH AEPB Inhale 1 puff into the lungs daily. 04/25/17  Yes [name], MD    Allergies as of 12/04/2017 - Review Complete 11/27/2017  Allergen Reaction Noted  . Ciprofloxacin hcl Hives 07/23/2011  . Iohexol  10/31/2010  . Prozac [fluoxetine hcl] Rash 05/11/2011  . Sulfonamide derivatives Hives and Rash     Family History  Problem Relation Age of Onset  . Pulmonary embolism Mother   . Heart attack Father   . Hypertension Father   . Liver cancer Sister   . Cancer Sister   . Cancer Brother   . Cancer Daughter   . Colon cancer Neg Hx     Social History   Socioeconomic History  . Marital status: Divorced    Spouse name: Not on file  . Number of children: Not on file  . Years of education: Not on file  . Highest education level: Not on file  Social Needs  . Financial resource strain: Not on file  . Food insecurity - worry: Not on file  . Food insecurity - inability: Not on file  . Transportation needs - medical: Not on file  . Transportation needs - non-medical: Not on file  Occupational History  . Not on file  Tobacco Use  . Smoking status: Former Smoker    Packs/day: 0.50    Years: 20.00    Pack years: 10.00    Types: Cigarettes    Last attempt to quit: 12/21/2010    Years since quitting: 6.9  .  Smokeless tobacco: Never Used  . Tobacco comment: smoking cessation info given and reviewed   Substance and Sexual Activity  . Alcohol use: Yes    Alcohol/week: 0.0 oz    Comment: seldom  . Drug use: No  . Sexual activity: Yes    Birth control/protection: Surgical  Other Topics Concern  . Not on file  Social History Narrative  . Not on file    Review of Systems: See HPI, otherwise negative ROS   Physical Exam: BP 103/62   Pulse 63   Temp 97.7 F (36.5 C) (Oral)   Resp 15   Ht 5\' 5"  (1.651 m)   Wt 119 lb (54 kg)   SpO2 95%   BMI 19.80 kg/m  General:   Alert,  pleasant and cooperative in  NAD Head:  Normocephalic and atraumatic. Neck:  Supple; Lungs:  Clear throughout to auscultation.    Heart:  Regular rate and rhythm. Abdomen:  Soft, nontender and nondistended. Normal bowel sounds, without guarding, and without rebound.   Neurologic:  Alert and  oriented x4;  grossly normal neurologically.  Impression/Plan:     DYSPHAGIA  PLAN:  EGD/DIL TODAY DISCUSSED PROCEDURE, BENEFITS, & RISKS: < 1% chance of medication reaction, bleeding, OR perforation.

## 2017-12-13 NOTE — Op Note (Signed)
[facility] Patient Name: [name] Procedure Date: 12/13/2017 8:04 AM MRN: [id number] Date of Birth: August 27, 1941 Attending MD: Barney Drain MD, MD CSN: [id number] Age: 77 Admit Type: Outpatient Procedure:                Upper GI endoscopy WITH ESOPHAGEAL DILATION/COLD                            FORCEPS BIOPSY Indications:              Dysphagia, Follow-up of Helicobacter pylori-RxX3. Providers:                Barney Drain MD, MD, Charlsie Quest. Theda Sers RN, RN,                            Nelma Rothman, Technician Referring MD:             Cammie Mcgee. [place] Medicines:                Meperidine 50 mg IV, Midazolam 3 mg IV Complications:            No immediate complications. Estimated Blood Loss:     Estimated blood loss was minimal. Procedure:                Pre-Anesthesia Assessment:                           - Prior to the procedure, a History and Physical                            was performed, and patient medications and                            allergies were reviewed. The patient's tolerance of                            previous anesthesia was also reviewed. The risks                            and benefits of the procedure and the sedation                            options and risks were discussed with the patient.                            All questions were answered, and informed consent                            was obtained. Prior Anticoagulants: The patient has                            taken no previous anticoagulant or antiplatelet                            agents. ASA Grade Assessment: II - A patient with  mild systemic disease. After reviewing the risks                            and benefits, the patient was deemed in                            satisfactory condition to undergo the procedure.                            After obtaining informed consent, the endoscope was                            passed under direct vision. Throughout  the                            procedure, the patient's blood pressure, pulse, and                            oxygen saturations were monitored continuously. The                            [id number] ([id number]) scope was introduced through the                            mouth, and advanced to the second part of duodenum.                            The upper GI endoscopy was somewhat difficult due                            to the patient's bp being low and iv being                            POSITIONAL. Successful completion of the procedure                            was aided by increasing the dose of sedation                            medication. The patient tolerated the procedure                            fairly well. 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 A guidewire was placed and the       scope was withdrawn. Dilation was performed with a Savary dilator with       mild resistance at 15 mm, 16 mm and 17 mm.      A small hiatal hernia was present.      Diffuse mild inflammation characterized by congestion (edema) and       erythema was found on the greater curvature of the stomach and in the  gastric antrum. Biopsies were taken with a cold forceps for Helicobacter       pylori testing.      The examined duodenum was normal. 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                           - Small hiatal hernia.                           - MILD Gastritis. Moderate Sedation:      Moderate (conscious) sedation was administered by the endoscopy nurse       and supervised by the endoscopist. The following parameters were       monitored: oxygen saturation, heart rate, blood pressure, and response       to care. Total physician intraservice time was 33 minutes. Recommendation:           - Await pathology results.                           - Return  to my office in 4 months.                           - Resume previous diet.                           - Continue present medications.                           - Patient has a contact number available for                            emergencies. The signs and symptoms of potential                            delayed complications were discussed with the                            patient. Return to normal activities tomorrow.                            Written discharge instructions were provided to the                            patient. Procedure Code(s):        --- Professional ---                           [phone], Esophagogastroduodenoscopy, flexible,                            transoral; with insertion of guide wire followed by                            passage of dilator(s) through esophagus over guide  wire                           [id number], Esophagogastroduodenoscopy, flexible,                            transoral; with biopsy, single or multiple                           [id number], Moderate sedation services provided by the                            same physician or other qualified health care                            professional performing the diagnostic or                            therapeutic service that the sedation supports,                            requiring the presence of an independent trained                            observer to assist in the monitoring of the                            patient's level of consciousness and physiological                            status; initial 15 minutes of intraservice time,                            patient age 56 years or older                           [phone], Moderate sedation services; each additional                            15 minutes intraservice time Diagnosis Code(s):        --- Professional ---                           Q39.4, Esophageal web                           K44.9, Diaphragmatic  hernia without obstruction or                            gangrene                           K29.70, Gastritis, unspecified, without bleeding                           R13.10, Dysphagia, unspecified  K47.30, Helicobacter pylori [H. pylori] as the                            cause of diseases classified elsewhere CPT copyright 2016 [employer]. All rights reserved. The codes documented in this report are preliminary and upon coder review may  be revised to meet current compliance requirements. Barney Drain, MD Barney Drain MD, MD 12/13/2017 9:28:20 AM This report has been signed electronically. Number of Addenda: 0

## 2017-12-16 ENCOUNTER — Other Ambulatory Visit: Payer: Self-pay

## 2017-12-16 DIAGNOSIS — D509 Iron deficiency anemia, unspecified: Secondary | ICD-10-CM

## 2017-12-16 DIAGNOSIS — K922 Gastrointestinal hemorrhage, unspecified: Secondary | ICD-10-CM

## 2017-12-17 NOTE — Progress Notes (Signed)
Noted  

## 2017-12-19 ENCOUNTER — Telehealth: Payer: Self-pay | Admitting: Family Medicine

## 2017-12-19 NOTE — Telephone Encounter (Signed)
Called patient to reschedule appt due to so many scheduled new patients in one day.

## 2017-12-23 ENCOUNTER — Telehealth: Payer: Self-pay | Admitting: Family Medicine

## 2017-12-23 NOTE — Telephone Encounter (Signed)
CB# [phone] Patient would like a prescription called into [facility] for her Prolia this will save her money. She would come here and get the shot this is due the end of this month. Please advise patient.

## 2017-12-24 ENCOUNTER — Other Ambulatory Visit (HOSPITAL_COMMUNITY): Payer: Self-pay

## 2017-12-24 DIAGNOSIS — D509 Iron deficiency anemia, unspecified: Secondary | ICD-10-CM

## 2017-12-25 ENCOUNTER — Encounter (HOSPITAL_COMMUNITY): Payer: Self-pay | Admitting: Gastroenterology

## 2017-12-25 MED ORDER — DENOSUMAB 60 MG/ML ~~LOC~~ SOLN: 60.0000 mg | Freq: Once | SUBCUTANEOUS | 0 refills | Status: AC

## 2017-12-25 NOTE — Telephone Encounter (Signed)
Pt aware via vm that med was sent to pharm and she can come here to get the injection

## 2017-12-26 ENCOUNTER — Inpatient Hospital Stay (HOSPITAL_COMMUNITY): Payer: PPO | Attending: Internal Medicine

## 2017-12-26 ENCOUNTER — Ambulatory Visit (HOSPITAL_COMMUNITY)
Admission: RE | Admit: 2017-12-26 | Discharge: 2017-12-26 | Disposition: A | Discharge status: Home / Self Care | Payer: PPO | Source: Ambulatory Visit | Attending: Gastroenterology | Admitting: Gastroenterology

## 2017-12-26 ENCOUNTER — Encounter (HOSPITAL_COMMUNITY)
Admission: RE | Disposition: A | Discharge status: Home / Self Care | Payer: Self-pay | Source: Ambulatory Visit | Attending: Gastroenterology

## 2017-12-26 DIAGNOSIS — D509 Iron deficiency anemia, unspecified: Secondary | ICD-10-CM | POA: Diagnosis not present

## 2017-12-26 DIAGNOSIS — C3432 Malignant neoplasm of lower lobe, left bronchus or lung: Secondary | ICD-10-CM | POA: Insufficient documentation

## 2017-12-26 DIAGNOSIS — D649 Anemia, unspecified: Secondary | ICD-10-CM

## 2017-12-26 HISTORY — PX: GIVENS CAPSULE STUDY: SHX5432

## 2017-12-26 LAB — CBC WITH DIFFERENTIAL/PLATELET
Basophils Absolute: 0 10*3/uL (ref 0.0–0.1)
Basophils Relative: 1 %
Eosinophils Absolute: 0.2 10*3/uL (ref 0.0–0.7)
Eosinophils Relative: 4 %
HCT: 43.1 % (ref 36.0–46.0)
Hemoglobin: 13.9 g/dL (ref 12.0–15.0)
Lymphocytes Relative: 21 %
Lymphs Abs: 1 10*3/uL (ref 0.7–4.0)
MCH: 29.1 pg (ref 26.0–34.0)
MCHC: 32.3 g/dL (ref 30.0–36.0)
MCV: 90.4 fL (ref 78.0–100.0)
Monocytes Absolute: 0.4 10*3/uL (ref 0.1–1.0)
Monocytes Relative: 9 %
Neutro Abs: 3.1 10*3/uL (ref 1.7–7.7)
Neutrophils Relative %: 65 %
Platelets: 348 10*3/uL (ref 150–400)
RBC: 4.77 MIL/uL (ref 3.87–5.11)
RDW: 13.5 % (ref 11.5–15.5)
WBC: 4.7 10*3/uL (ref 4.0–10.5)

## 2017-12-26 LAB — IRON AND TIBC
Iron: 110 ug/dL (ref 28–170)
Saturation Ratios: 31 % (ref 10.4–31.8)
TIBC: 350 ug/dL (ref 250–450)
UIBC: 240 ug/dL

## 2017-12-26 LAB — COMPREHENSIVE METABOLIC PANEL
ALT: 14 U/L (ref 14–54)
AST: 18 U/L (ref 15–41)
Albumin: 4.3 g/dL (ref 3.5–5.0)
Alkaline Phosphatase: 52 U/L (ref 38–126)
Anion gap: 11 (ref 5–15)
BUN: 9 mg/dL (ref 6–20)
CO2: 27 mmol/L (ref 22–32)
Calcium: 9.4 mg/dL (ref 8.9–10.3)
Chloride: 94 mmol/L — ABNORMAL LOW (ref 101–111)
Creatinine, Ser: 0.55 mg/dL (ref 0.44–1.00)
GFR calc Af Amer: >60 mL/min (ref >60)
GFR calc non Af Amer: >60 mL/min (ref >60)
Glucose, Bld: 89 mg/dL (ref 65–99)
Potassium: 4 mmol/L (ref 3.5–5.1)
Sodium: 132 mmol/L — ABNORMAL LOW (ref 135–145)
Total Bilirubin: 0.8 mg/dL (ref 0.3–1.2)
Total Protein: 7.3 g/dL (ref 6.5–8.1)

## 2017-12-26 LAB — FERRITIN: Ferritin: 87 ng/mL (ref 11–307)

## 2017-12-26 SURGERY — IMAGING PROCEDURE, GI TRACT, INTRALUMINAL, VIA CAPSULE

## 2017-12-27 ENCOUNTER — Encounter (HOSPITAL_COMMUNITY): Payer: Self-pay | Admitting: Gastroenterology

## 2017-12-30 NOTE — Procedures (Signed)
   PATIENT DATA:   GASTRIC PASSAGE TIME: 44 m, SB PASSAGE TIME: 8 M capsule returns to STOMACH FROM 00:52:53 UNTIL THE END OF THE STUDY  RESULTS: LIMITED views of gastric mucosa due to retained contents. GASTRITIS, MILD. LIMITED VIEW OF PROXIMAL SMALL BOWEL-No ULCERS, masses or AVMs seen. No old blood or fresh blood in the stomach, OR small bowel.  DIAGNOSIS: MILD GASTRITIS  Plan: 1. KUB TO CHECK THAT CAPSULE HAS PASSED. IF SO,NEEDS EGD TO PLACE GIVENS CAPSULE INTO THE SMALL BOWEL. 2. OPV IN JUL 2019.

## 2018-01-01 IMAGING — CR DG CHEST 2V
2 series · 2 of 2 positions shown · non-contrast
Comparison: 08/01/2015

CLINICAL DATA: Hypoxia and tachycardia

EXAM:
CHEST  2 VIEW

[chest pa]
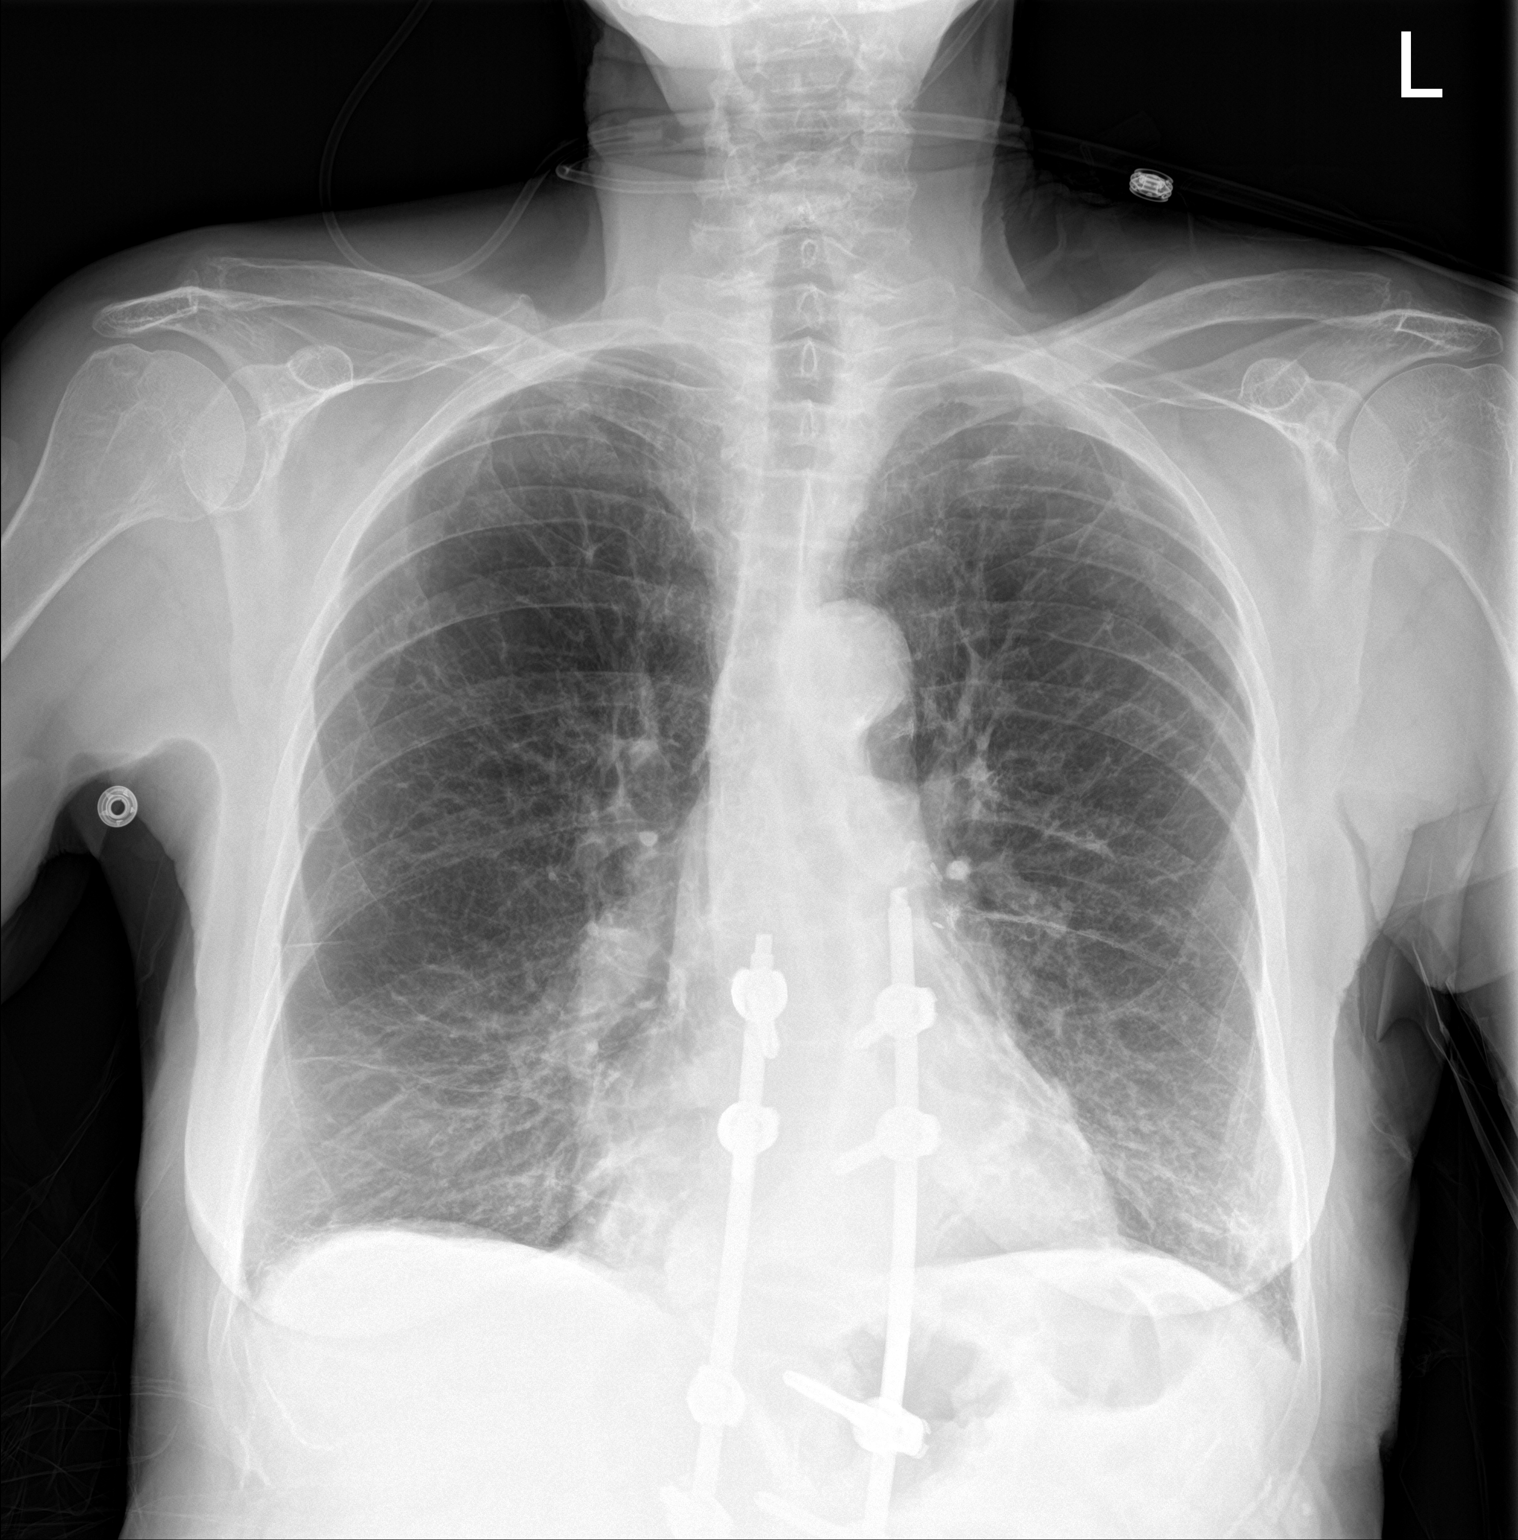

[chest lat]
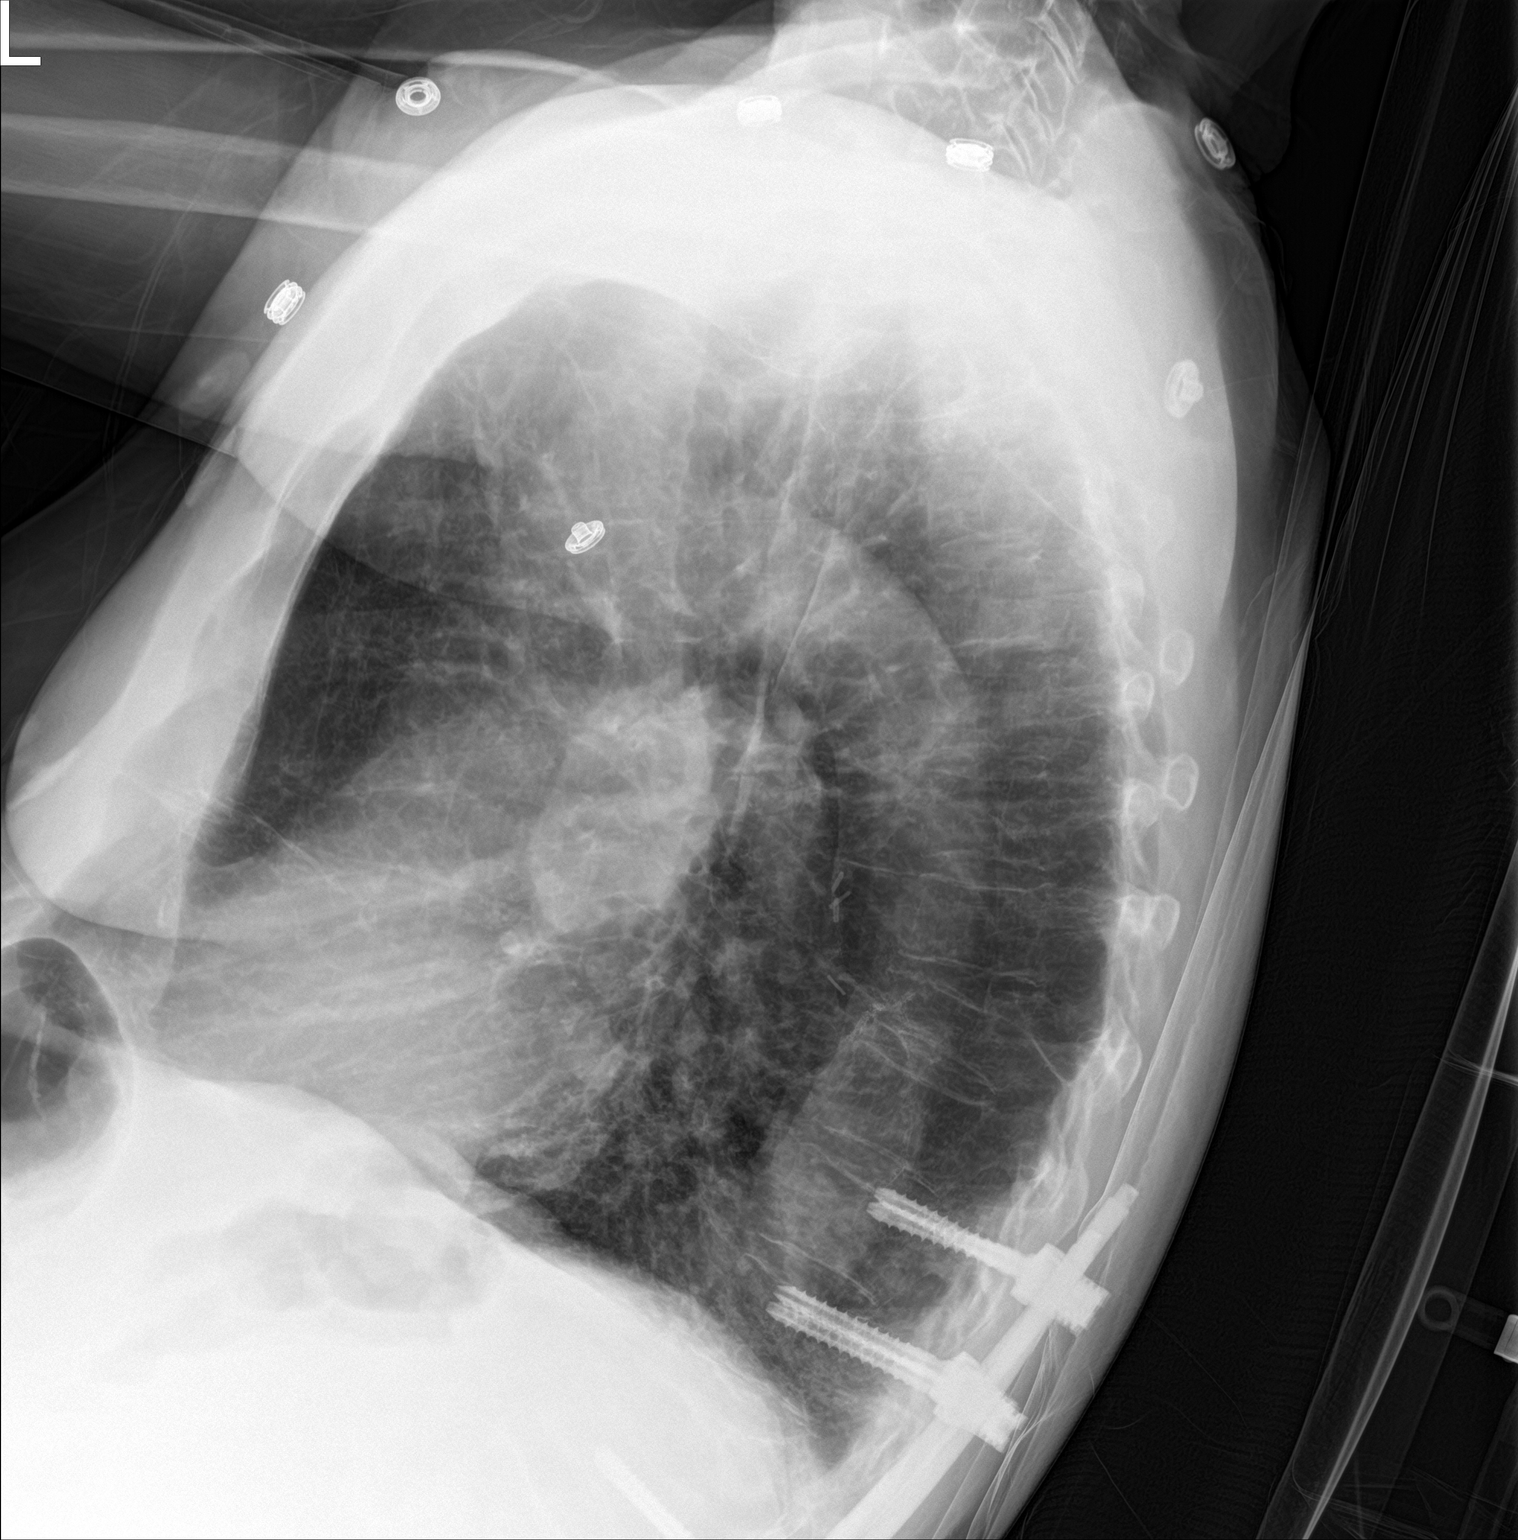

[2 of 2 positions shown; findings below may reference images not displayed]

FINDINGS: Cardiac shadow is stable. A small hiatal hernia is noted. Fixation
rods in the thoracolumbar spine are again seen. Chronic interstitial
changes are noted without focal infiltrate or sizable effusion.
Compression deformity at T8 is again noted and stable.
IMPRESSION: No active cardiopulmonary disease.

## 2018-01-02 ENCOUNTER — Telehealth: Payer: Self-pay | Admitting: *Deleted

## 2018-01-02 ENCOUNTER — Ambulatory Visit: Payer: PPO | Admitting: Family Medicine

## 2018-01-02 DIAGNOSIS — D509 Iron deficiency anemia, unspecified: Secondary | ICD-10-CM

## 2018-01-02 NOTE — Telephone Encounter (Signed)
Patient called in requesting her GIVENS capsule study results. Please advise Dr. Oneida Alar. Thanks

## 2018-01-02 NOTE — Telephone Encounter (Signed)
PLEASE CALL PT. THE GIVENS STUDY WAS NOT COMPLETE. THE CAPSULE STAYED IN HER STOMACH. SHE NEEDS A KUB TO CONFIRM IT HAS PASSED AND THEN WE WILL SCHEDULE AN  EGD TO PLACE THE GIVENS CAPSULE IN HER SMALL BOWEL.

## 2018-01-03 ENCOUNTER — Ambulatory Visit (HOSPITAL_COMMUNITY)
Admission: RE | Admit: 2018-01-03 | Discharge: 2018-01-03 | Disposition: A | Discharge status: Home / Self Care | Payer: PPO | Source: Ambulatory Visit | Attending: Gastroenterology | Admitting: Gastroenterology

## 2018-01-03 DIAGNOSIS — D509 Iron deficiency anemia, unspecified: Secondary | ICD-10-CM | POA: Insufficient documentation

## 2018-01-03 DIAGNOSIS — K5641 Fecal impaction: Secondary | ICD-10-CM | POA: Insufficient documentation

## 2018-01-03 DIAGNOSIS — K5900 Constipation, unspecified: Secondary | ICD-10-CM | POA: Diagnosis not present

## 2018-01-03 NOTE — Telephone Encounter (Signed)
Spoke with pt and is aware of below. She states she can go on Tuesday for KUB. Orders placed.

## 2018-01-06 ENCOUNTER — Other Ambulatory Visit: Payer: Self-pay

## 2018-01-06 DIAGNOSIS — D649 Anemia, unspecified: Secondary | ICD-10-CM

## 2018-01-06 NOTE — Progress Notes (Signed)
cc'd to pcp 

## 2018-01-07 ENCOUNTER — Ambulatory Visit (INDEPENDENT_AMBULATORY_CARE_PROVIDER_SITE_OTHER): Payer: PPO | Admitting: Family Medicine

## 2018-01-07 ENCOUNTER — Ambulatory Visit: Payer: PPO | Admitting: Gastroenterology

## 2018-01-07 ENCOUNTER — Ambulatory Visit: Payer: PPO | Admitting: Family Medicine

## 2018-01-07 VITALS — BP 110/72 | HR 70 | Temp 97.6°F | Resp 14 | Ht 66.0 in | Wt 116.0 lb

## 2018-01-07 DIAGNOSIS — M810 Age-related osteoporosis without current pathological fracture: Secondary | ICD-10-CM

## 2018-01-07 DIAGNOSIS — E871 Hypo-osmolality and hyponatremia: Secondary | ICD-10-CM

## 2018-01-07 DIAGNOSIS — J449 Chronic obstructive pulmonary disease, unspecified: Secondary | ICD-10-CM

## 2018-01-07 DIAGNOSIS — R0609 Other forms of dyspnea: Secondary | ICD-10-CM

## 2018-01-07 DIAGNOSIS — E222 Syndrome of inappropriate secretion of antidiuretic hormone: Secondary | ICD-10-CM

## 2018-01-07 DIAGNOSIS — R0600 Dyspnea, unspecified: Secondary | ICD-10-CM

## 2018-01-07 MED ORDER — DENOSUMAB 60 MG/ML ~~LOC~~ SOLN
60.0000 mg | Freq: Once | SUBCUTANEOUS | Status: AC
  Administered 2018-01-07: 60 mg via SUBCUTANEOUS

## 2018-01-07 NOTE — Progress Notes (Signed)
Subjective:    Patient ID: [name], female    DOB: May 18, 1941, 77 y.o.   MRN: [id number]  HPI  Please see my office visit from September.  Patient had a CT scan of the chest in August 2018 to monitor for recurrence of her lung cancer.  I have copied the findings of the CT scan below:   IMPRESSION: 1. Status post left lower lobe wedge resection. No findings to suggest local recurrence of disease or definite metastatic disease in the thorax. Previously noted ground-glass attenuation nodule in the superior segment of the right lower lobe appears slightly smaller than the prior study, suggesting a benign etiology. 2. Diffuse bronchial wall thickening with moderate to severe centrilobular and paraseptal emphysema; imaging findings suggestive of underlying COPD. 3. Aortic atherosclerosis, in addition to left main and left circumflex coronary artery disease. Assessment for potential risk factor modification, dietary therapy or pharmacologic therapy may be warranted, if clinically indicated. 4. Increasing anterior pericardial fluid and/or thickening. This is unlikely to be of hemodynamic significance at this time. No associated pericardial calcification. 5. New (but non acute) compression fracture of T6 with approximately 90% loss of anterior vertebral body height, in addition to multiple other chronic compression fractures of the visualize the thoracolumbar spine.  At that time, due to the multiple vertebral fractures, we resume Prolia and I recommended that the patient take calcium and vitamin D.  I also recommended an echocardiogram of the heart to evaluate the pericardial effusion more thoroughly.  Regarding calcifications in the left main and left circumflex coronary artery, I recommended clinical monitoring because the patient was asymptomatic at the time.  She presents today stating that she is becoming more short of breath with activity.  She used to walk to the mailbox up a  mild incline.  However she can no longer do this because of increasing shortness of breath with exertion.  She is now unable to walk her dog due to shortness of breath with activity.  Patient has severe underlying COPD/emphysema.  She is on chronic oxygen and anoro.  This is most likely the explanation however given the pericardial effusion as well as calcification in her coronary arteries, patient was concerned that there may be a component that is cardiac in nature to her shortness of breath.  She also reports atypical left-sided chest pain which does not sound anginal in nature Past Medical History:  Diagnosis Date  . Adenocarcinoma of lung ([place]) 12/2010   left lung/surg only  . Allergic rhinitis   . 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 Anxiety disorder   . Aortic aneurysm ([place])   . Complication of anesthesia 2011   bloodpressure dropped during colonoscopy, not problems since  . Compression fracture   . COPD (chronic obstructive pulmonary disease) ([place])   . Depression   . Diastolic dysfunction   . Diverticulosis   . Dysphagia   . Emphysema lung ([place]) 11/08/2014  . Hiatal hernia   . Hypothyroidism   . IBS (irritable bowel syndrome)   . Iron deficiency anemia due to chronic blood loss 12/05/2016  . On home O2 12/15/2013   chronic hypoxia  . PUD (peptic ulcer disease)    42yrs  . Shortness of breath    with exertion  . SIADH (syndrome of inappropriate ADH production) ([place])   . Trigeminal neuralgia    Past Surgical History:  Procedure Laterality Date  . ABDOMINAL HYSTERECTOMY    . BACK  SURGERY  January 13, 2014  . CATARACT EXTRACTION    . CATARACT EXTRACTION W/PHACO  09/02/2012   Procedure: CATARACT EXTRACTION PHACO AND INTRAOCULAR LENS PLACEMENT (IOC);  Surgeon: Elta Guadeloupe T. Gershon Crane, MD;  Location: [facility];  Service: Ophthalmology;  Laterality: Left;  CDE:10.35  . CHOLECYSTECTOMY    . COLONOSCOPY  2011   hyperplastic polyp, 3-4 small cecal AVMs, nonbleeding    . COLONOSCOPY N/A 11/23/2014   Dr. Oneida Alar: moderate diverticulosis, hemorrhoids, redundant colon. next TCS in 10-15 years.   Marland Kitchen ENDARTERECTOMY Right 08/21/2013   Procedure: RIGHT CAROTID ANEURYSM RESECTION;  Surgeon: Rosetta Posner, MD;  Location: [facility];  Service: Vascular;  Laterality: Right;  . ESOPHAGOGASTRODUODENOSCOPY  11/13/2009   w/dilation to 4mm, gastric ulceration (H.Pylori) s/p treatment  . ESOPHAGOGASTRODUODENOSCOPY  11/21/2009   distal esophageal web, gastritis  . ESOPHAGOGASTRODUODENOSCOPY  03/14/12   [id number] in the distal esophagus/Mild gastritis/small HH. + H.pylori, prescribed Pylera. Finished treatment.   . ESOPHAGOGASTRODUODENOSCOPY N/A 12/13/2017   Procedure: ESOPHAGOGASTRODUODENOSCOPY (EGD);  Surgeon: Danie Binder, MD;  [facility];  Service: Endoscopy;  Laterality: N/A;  8:30am  . fatty tumor removal from lt groin    . FRACTURE SURGERY Left    hand and left ring finger  . GIVENS CAPSULE STUDY N/A 12/26/2017   Procedure: GIVENS CAPSULE STUDY;  Surgeon: Danie Binder, MD;  [facility];  Service: Endoscopy;  Laterality: N/A;  7:30am  . LUNG CANCER SURGERY  12/2010   Left VATS, minithoracotomy, LLL superior segmentectomy  . ORIF WRIST FRACTURE Left 04/09/2014   Procedure: OPEN REDUCTION INTERNAL FIXATION (ORIF) WRIST FRACTURE;  Surgeon: Renette Butters, MD;  Location: [place];  Service: Orthopedics;  Laterality: Left;  . PERCUTANEOUS PINNING Left 04/09/2014   Procedure: PERCUTANEOUS PINNING EXTREMITY;  Surgeon: Renette Butters, MD;  Location: [place];  Service: Orthopedics;  Laterality: Left;  . SAVORY DILATION N/A 12/13/2017   Procedure: SAVORY DILATION;  Surgeon: Danie Binder, MD;  [facility];  Service: Endoscopy;  Laterality: N/A;  . TUBAL LIGATION    . vocal cord surgery  02/06/2011   laryngoscopy with bilateral vocal cord Radiesse injection for vocal cord paralysis  . YAG LASER APPLICATION Left 10/10/7251   Procedure: YAG LASER  APPLICATION;  Surgeon: Rutherford Guys, MD;  Location: [facility];  Service: Ophthalmology;  Laterality: Left;   Current Outpatient Medications on File Prior to Visit  Medication Sig Dispense Refill  . acetaminophen (TYLENOL) 325 MG tablet Take 650 mg by mouth every 6 (six) hours as needed (FOR PAIN.).    Marland Kitchen ALPRAZolam (XANAX) 0.5 MG tablet TAKE (1) TABLET BY MOUTH TWICE A DAY AS NEEDED. (Patient taking differently: TAKE 1 TABLET (0.5 MG) BY MOUTH AT BEDTIME) 60 tablet 0  . Calcium-Magnesium-Vitamin D (CITRACAL CALCIUM+D) 600-40-500 MG-MG-UNIT TB24 Take 1 tablet by mouth daily.    . cholecalciferol (VITAMIN D) 1000 units tablet Take 1,000 Units by mouth daily.    . citalopram (CELEXA) 40 MG tablet Take 0.5 tablets (20 mg total) by mouth at bedtime. 90 tablet 4  . levothyroxine (SYNTHROID, LEVOTHROID) 50 MCG tablet Take 1 tablet (50 mcg total) by mouth daily before breakfast. 90 tablet 3  . magnesium oxide (MAG-OX) 400 MG tablet Take 400 mg by mouth 2 (two) times daily.    . Misc Natural Products (SUPER GREENS PO) Take 1 Scoop by mouth every evening.    . Multiple Vitamin (MULTIVITAMIN WITH MINERALS) TABS tablet Take 1 tablet by mouth  daily.    . omeprazole (PRILOSEC) 20 MG capsule 1 PO 30 MINS PRIOR TO BREAKFAST. 90 capsule 3  . ondansetron (ZOFRAN) 4 MG tablet Take 1 tablet (4 mg total) by mouth 3 (three) times daily as needed for nausea or vomiting. 90 tablet 0  . umeclidinium-vilanterol (ANORO ELLIPTA) 62.5-25 MCG/INH AEPB Inhale 1 puff into the lungs daily. 90 each 3   No current facility-administered medications on file prior to visit.    Allergies  Allergen Reactions  . Ciprofloxacin Hcl Hives  . Iohexol      Desc: Per [facility], pt is allergic to IV contrast, no type of reaction was available. Pt cannot remember but first reacted around 15 yrs ago from an IVP. Notes were date from 2009 at urology center., [place] Date: [id number]   . Prozac [Fluoxetine Hcl] Rash  . Sulfonamide Derivatives  Hives and Rash   Social History   Socioeconomic History  . Marital status: Divorced    Spouse name: Not on file  . Number of children: Not on file  . Years of education: Not on file  . Highest education level: Not on file  Occupational History  . Not on file  Social Needs  . Financial resource strain: Not on file  . Food insecurity:    Worry: Not on file    Inability: Not on file  . Transportation needs:    Medical: Not on file    Non-medical: Not on file  Tobacco Use  . Smoking status: Former Smoker    Packs/day: 0.50    Years: 20.00    Pack years: 10.00    Types: Cigarettes    Last attempt to quit: 12/21/2010    Years since quitting: 7.0  . Smokeless tobacco: Never Used  . Tobacco comment: smoking cessation info given and reviewed   Substance and Sexual Activity  . Alcohol use: Yes    Alcohol/week: 0.0 oz    Comment: seldom  . Drug use: No  . Sexual activity: Yes    Birth control/protection: Surgical  Lifestyle  . Physical activity:    Days per week: Not on file    Minutes per session: Not on file  . Stress: Not on file  Relationships  . Social connections:    Talks on phone: Not on file    Gets together: Not on file    Attends religious service: Not on file    Active member of club or organization: Not on file    Attends meetings of clubs or organizations: Not on file    Relationship status: Not on file  . Intimate partner violence:    Fear of current or ex partner: Not on file    Emotionally abused: Not on file    Physically abused: Not on file    Forced sexual activity: Not on file  Other Topics Concern  . Not on file  Social History Narrative  . Not on file     Past Medical History:  Diagnosis Date  . Adenocarcinoma of lung ([place]) 12/2010   left lung/surg only  . Allergic rhinitis   . 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 Anxiety disorder   . Aortic aneurysm ([place])   . Complication of anesthesia 2011    bloodpressure dropped during colonoscopy, not problems since  . Compression fracture   . COPD (chronic obstructive pulmonary disease) ([place])   . Depression   . Diastolic dysfunction   . Diverticulosis   .  Dysphagia   . Emphysema lung ([place]) 11/08/2014  . Hiatal hernia   . Hypothyroidism   . IBS (irritable bowel syndrome)   . Iron deficiency anemia due to chronic blood loss 12/05/2016  . On home O2 12/15/2013   chronic hypoxia  . PUD (peptic ulcer disease)    66yrs  . Shortness of breath    with exertion  . SIADH (syndrome of inappropriate ADH production) ([place])   . Trigeminal neuralgia    Past Surgical History:  Procedure Laterality Date  . ABDOMINAL HYSTERECTOMY    . BACK SURGERY  January 13, 2014  . CATARACT EXTRACTION    . CATARACT EXTRACTION W/PHACO  09/02/2012   Procedure: CATARACT EXTRACTION PHACO AND INTRAOCULAR LENS PLACEMENT (IOC);  Surgeon: Elta Guadeloupe T. Gershon Crane, MD;  [facility];  Service: Ophthalmology;  Laterality: Left;  CDE:10.35  . CHOLECYSTECTOMY    . 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 COLONOSCOPY N/A 11/23/2014   Dr. Oneida Alar: moderate diverticulosis, hemorrhoids, redundant colon. next TCS in 10-15 years.   Marland Kitchen ENDARTERECTOMY Right 08/21/2013   Procedure: RIGHT CAROTID ANEURYSM RESECTION;  Surgeon: Rosetta Posner, MD;  Location: [facility] OR;  Service: Vascular;  Laterality: Right;  . ESOPHAGOGASTRODUODENOSCOPY  11/13/2009   w/dilation to 77mm, gastric ulceration (H.Pylori) s/p treatment  . ESOPHAGOGASTRODUODENOSCOPY  11/21/2009   distal esophageal web, gastritis  . ESOPHAGOGASTRODUODENOSCOPY  03/14/12   VOJ:[id number] in the distal esophagus/Mild gastritis/small HH. + H.pylori, prescribed Pylera. Finished treatment.   . ESOPHAGOGASTRODUODENOSCOPY N/A 12/13/2017   Procedure: ESOPHAGOGASTRODUODENOSCOPY (EGD);  Surgeon: Danie Binder, MD;  [facility];  Service: Endoscopy;  Laterality: N/A;  8:30am  . fatty tumor removal from lt groin     . FRACTURE SURGERY Left    hand and left ring finger  . GIVENS CAPSULE STUDY N/A 12/26/2017   Procedure: GIVENS CAPSULE STUDY;  Surgeon: Danie Binder, MD;  [facility];  Service: Endoscopy;  Laterality: N/A;  7:30am  . LUNG CANCER SURGERY  12/2010   Left VATS, minithoracotomy, LLL superior segmentectomy  . ORIF WRIST FRACTURE Left 04/09/2014   Procedure: OPEN REDUCTION INTERNAL FIXATION (ORIF) WRIST FRACTURE;  Surgeon: Renette Butters, MD;  Location: [place];  Service: Orthopedics;  Laterality: Left;  . PERCUTANEOUS PINNING Left 04/09/2014   Procedure: PERCUTANEOUS PINNING EXTREMITY;  Surgeon: Renette Butters, MD;  Location: [place];  Service: Orthopedics;  Laterality: Left;  . SAVORY DILATION N/A 12/13/2017   Procedure: SAVORY DILATION;  Surgeon: Danie Binder, MD;  [facility];  Service: Endoscopy;  Laterality: N/A;  . TUBAL LIGATION    . vocal cord surgery  02/06/2011   laryngoscopy with bilateral vocal cord Radiesse injection for vocal cord paralysis  . YAG LASER APPLICATION Left 99/37/1696   Procedure: YAG LASER APPLICATION;  Surgeon: Rutherford Guys, MD;  [facility];  Service: Ophthalmology;  Laterality: Left;   Current Outpatient Medications on File Prior to Visit  Medication Sig Dispense Refill  . acetaminophen (TYLENOL) 325 MG tablet Take 650 mg by mouth every 6 (six) hours as needed (FOR PAIN.).    Marland Kitchen ALPRAZolam (XANAX) 0.5 MG tablet TAKE (1) TABLET BY MOUTH TWICE A DAY AS NEEDED. (Patient taking differently: TAKE 1 TABLET (0.5 MG) BY MOUTH AT BEDTIME) 60 tablet 0  . Calcium-Magnesium-Vitamin D (CITRACAL CALCIUM+D) 600-40-500 MG-MG-UNIT TB24 Take 1 tablet by mouth daily.    . cholecalciferol (VITAMIN D) 1000 units tablet Take 1,000 Units by  mouth daily.    . citalopram (CELEXA) 40 MG tablet Take 0.5 tablets (20 mg total) by mouth at bedtime. 90 tablet 4  . levothyroxine (SYNTHROID, LEVOTHROID) 50 MCG tablet Take 1 tablet (50 mcg total) by mouth daily before  breakfast. 90 tablet 3  . magnesium oxide (MAG-OX) 400 MG tablet Take 400 mg by mouth 2 (two) times daily.    . Misc Natural Products (SUPER GREENS PO) Take 1 Scoop by mouth every evening.    . Multiple Vitamin (MULTIVITAMIN WITH MINERALS) TABS tablet Take 1 tablet by mouth daily.    Marland Kitchen omeprazole (PRILOSEC) 20 MG capsule 1 PO 30 MINS PRIOR TO BREAKFAST. 90 capsule 3  . ondansetron (ZOFRAN) 4 MG tablet Take 1 tablet (4 mg total) by mouth 3 (three) times daily as needed for nausea or vomiting. 90 tablet 0  . umeclidinium-vilanterol (ANORO ELLIPTA) 62.5-25 MCG/INH AEPB Inhale 1 puff into the lungs daily. 90 each 3   No current facility-administered medications on file prior to visit.    Allergies  Allergen Reactions  . Ciprofloxacin Hcl Hives  . Iohexol      Desc: Per [facility], pt is allergic to IV contrast, no type of reaction was available. Pt cannot remember but first reacted around 15 yrs ago from an IVP. Notes were date from 2009 at urology center., [place] Date: [id number]   . Prozac [Fluoxetine Hcl] Rash  . Sulfonamide Derivatives Hives and Rash   Social History   Socioeconomic History  . Marital status: Divorced    Spouse name: Not on file  . Number of children: Not on file  . Years of education: Not on file  . Highest education level: Not on file  Occupational History  . Not on file  Social Needs  . Financial resource strain: Not on file  . Food insecurity:    Worry: Not on file    Inability: Not on file  . Transportation needs:    Medical: Not on file    Non-medical: Not on file  Tobacco Use  . Smoking status: Former Smoker    Packs/day: 0.50    Years: 20.00    Pack years: 10.00    Types: Cigarettes    Last attempt to quit: 12/21/2010    Years since quitting: 7.0  . Smokeless tobacco: Never Used  . Tobacco comment: smoking cessation info given and reviewed   Substance and Sexual Activity  . Alcohol use: Yes    Alcohol/week: 0.0 oz    Comment: seldom  .  Drug use: No  . Sexual activity: Yes    Birth control/protection: Surgical  Lifestyle  . Physical activity:    Days per week: Not on file    Minutes per session: Not on file  . Stress: Not on file  Relationships  . Social connections:    Talks on phone: Not on file    Gets together: Not on file    Attends religious service: Not on file    Active member of club or organization: Not on file    Attends meetings of clubs or organizations: Not on file    Relationship status: Not on file  . Intimate partner violence:    Fear of current or ex partner: Not on file    Emotionally abused: Not on file    Physically abused: Not on file    Forced sexual activity: Not on file  Other Topics Concern  . Not on file  Social History Narrative  . Not  on file    Review of Systems  All other systems reviewed and are negative.      Objective:   Physical Exam  Constitutional: She appears well-developed and well-nourished. No distress.  Neck: No JVD present. No thyromegaly present.  Cardiovascular: Normal rate, regular rhythm and normal heart sounds.  No murmur heard. Pulmonary/Chest: Effort normal. She has wheezes.  Abdominal: Soft. Bowel sounds are normal. She exhibits no distension. There is no tenderness. There is no rebound and no guarding.  Musculoskeletal: She exhibits no edema.  Lymphadenopathy:    She has no cervical adenopathy.  Skin: She is not diaphoretic.          Assessment & Plan:  Age related osteoporosis, unspecified pathological fracture presence - Plan: denosumab (PROLIA) injection 60 mg  SIADH (syndrome of inappropriate ADH production) ([place])  Chronic obstructive pulmonary disease, unspecified COPD type ([place])  Hyponatremia  Exertional dyspnea  Given the patient's increasing exertional dyspnea, I believe cardiology consultation is warranted.  Most likely her dyspnea on exertion is related to deconditioning, severe COPD, and her chronic hypoxia.  However she does  have noted calcification in her coronary arteries as well as a chronic idiopathic pericardial effusion.  I believe stress testing is reasonable to rule out reversible ischemia as a potential contributing factor to her dyspnea on exertion.  Regarding her hyponatremia, her most recent sodium was 132.  I have recommended restricting her fluids to less than 1200 mL a day.  This is due to her underlying SIADH.  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  I recommended 1200 mg a day of calcium despite the calcification seen on her CT scan because the benefit outweighs the risk.  Also recommended continuing vitamin D.  Regarding her COPD, she is not smoking, she is on oxygen, I recommended continuing anoro.  I do not see benefit at switching to Trelegy given the fact the patient does not have frequent exacerbations requiring prednisone.

## 2018-01-17 ENCOUNTER — Encounter: Payer: Self-pay | Admitting: Family Medicine

## 2018-01-17 ENCOUNTER — Other Ambulatory Visit: Payer: Self-pay | Admitting: Family Medicine

## 2018-01-17 MED ORDER — CITALOPRAM HYDROBROMIDE 40 MG PO TABS: 20.0000 mg | ORAL_TABLET | Freq: Every day | ORAL | 4 refills | Status: DC

## 2018-01-17 MED ORDER — ALPRAZOLAM 0.5 MG PO TABS: 0.5000 mg | ORAL_TABLET | Freq: Two times a day (BID) | ORAL | 0 refills | Status: DC | PRN

## 2018-01-17 MED ORDER — LUBIPROSTONE 24 MCG PO CAPS: 24.0000 ug | ORAL_CAPSULE | Freq: Two times a day (BID) | ORAL | 3 refills | Status: DC

## 2018-01-17 MED ORDER — LEVOTHYROXINE SODIUM 50 MCG PO TABS: 50.0000 ug | ORAL_TABLET | Freq: Every day | ORAL | 3 refills | Status: DC

## 2018-01-17 NOTE — Telephone Encounter (Signed)
Ok to refill??  Last office visit 01/07/2018.  Last refill 10/31/2017.

## 2018-01-22 ENCOUNTER — Ambulatory Visit: Payer: PPO | Admitting: Family Medicine

## 2018-01-28 ENCOUNTER — Telehealth: Payer: Self-pay | Admitting: Gastroenterology

## 2018-01-28 ENCOUNTER — Other Ambulatory Visit (HOSPITAL_COMMUNITY): Payer: Self-pay

## 2018-01-28 DIAGNOSIS — D500 Iron deficiency anemia secondary to blood loss (chronic): Secondary | ICD-10-CM

## 2018-01-28 NOTE — Telephone Encounter (Signed)
Noted  

## 2018-01-28 NOTE — Telephone Encounter (Signed)
Pt called to let MB know that she will keep her procedure date with SF on 5/20. She had doubts at one time and was going to cancel, but has changed her mine.

## 2018-01-29 ENCOUNTER — Ambulatory Visit: Payer: PPO | Admitting: Family Medicine

## 2018-02-07 ENCOUNTER — Encounter: Payer: Self-pay | Admitting: Family Medicine

## 2018-02-12 ENCOUNTER — Encounter: Payer: Self-pay | Admitting: Cardiovascular Disease

## 2018-02-12 ENCOUNTER — Ambulatory Visit: Payer: PPO | Admitting: Cardiovascular Disease

## 2018-02-12 VITALS — BP 118/70 | HR 67 | Ht 65.0 in | Wt 114.0 lb

## 2018-02-12 DIAGNOSIS — R0600 Dyspnea, unspecified: Secondary | ICD-10-CM

## 2018-02-12 DIAGNOSIS — I2510 Atherosclerotic heart disease of native coronary artery without angina pectoris: Secondary | ICD-10-CM | POA: Diagnosis not present

## 2018-02-12 DIAGNOSIS — R0602 Shortness of breath: Secondary | ICD-10-CM

## 2018-02-12 DIAGNOSIS — J439 Emphysema, unspecified: Secondary | ICD-10-CM

## 2018-02-12 DIAGNOSIS — R0609 Other forms of dyspnea: Secondary | ICD-10-CM | POA: Diagnosis not present

## 2018-02-12 DIAGNOSIS — Z77120 Contact with and (suspected) exposure to mold (toxic): Secondary | ICD-10-CM

## 2018-02-12 DIAGNOSIS — I309 Acute pericarditis, unspecified: Secondary | ICD-10-CM

## 2018-02-12 IMAGING — DX DG HIP (WITH OR WITHOUT PELVIS) 2-3V*R*
3 series · 3 of 3 positions shown · non-contrast
Comparison: Coronal and sagittal reconstructed images through the
pelvis and hips from an abdominal and pelvic CT scan November 19, 2014

CLINICAL DATA: Chronic right hip pain with no known injury;
symptoms radiate from the hip joint region into the femur.

EXAM:
DG HIP (WITH OR WITHOUT PELVIS) 2-3V RIGHT

[pelvis ap]
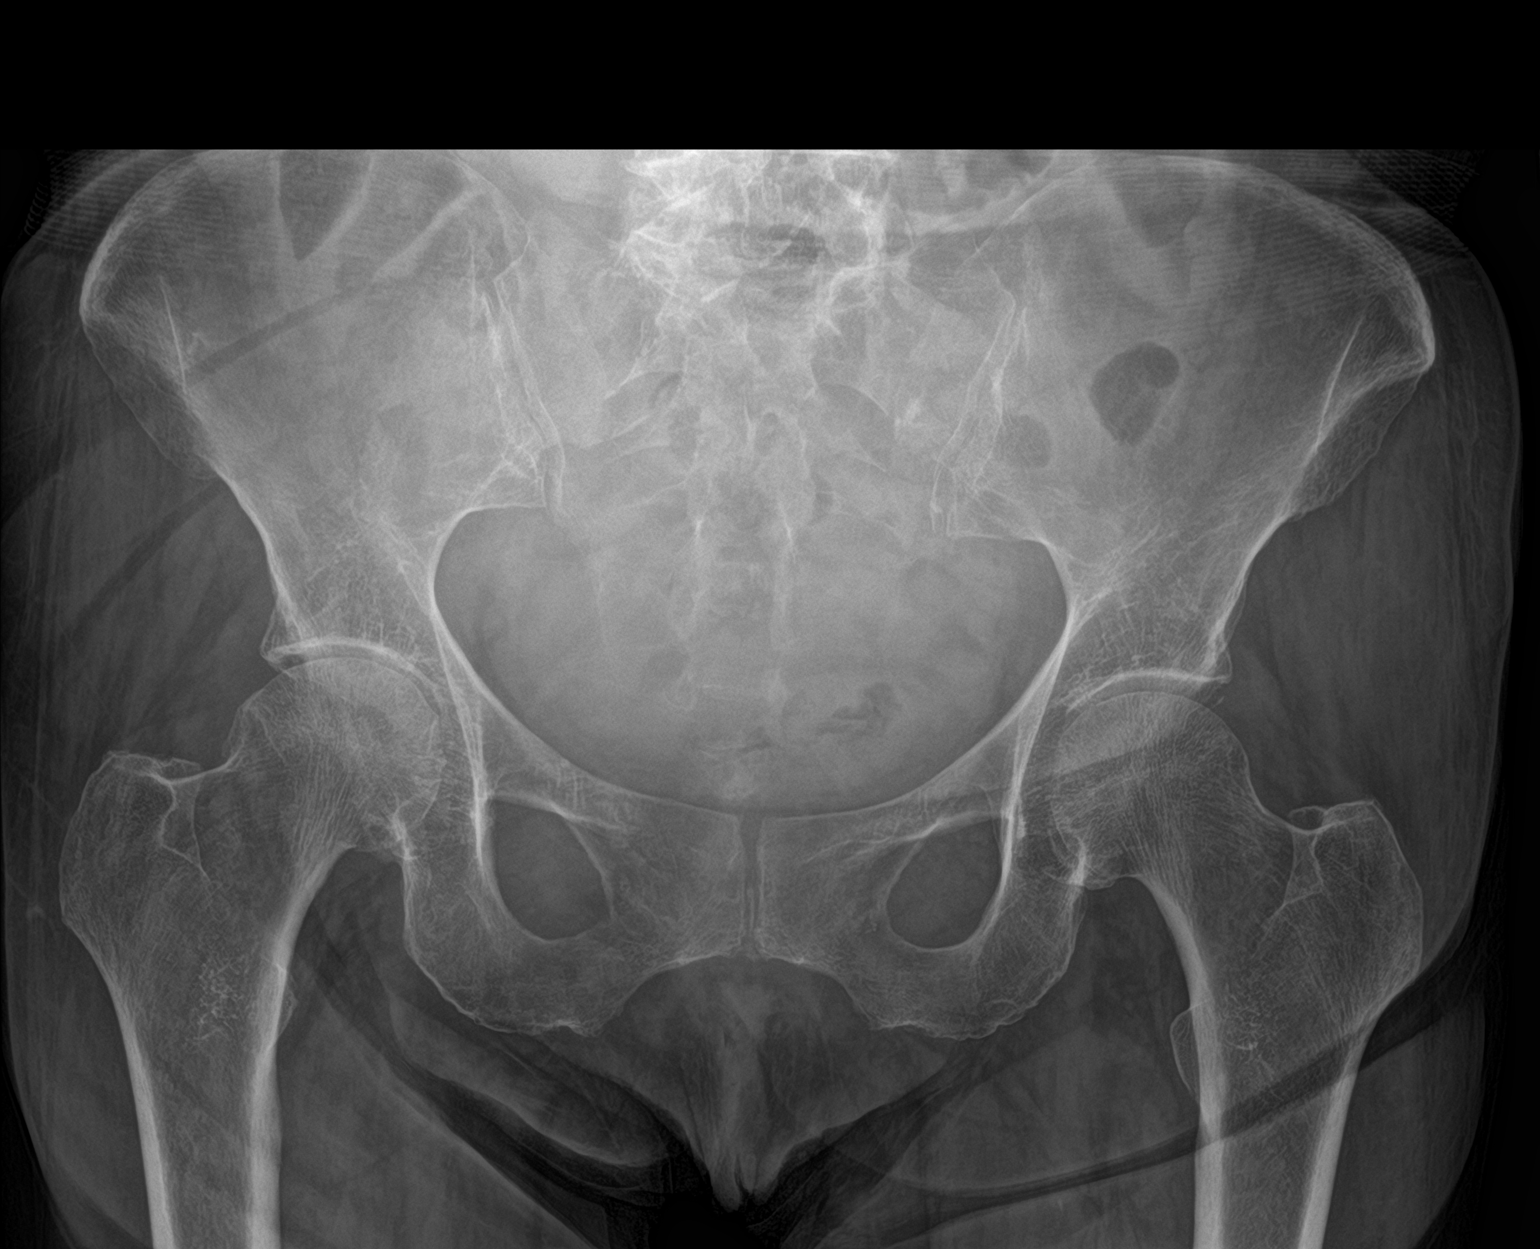

[hip ap]
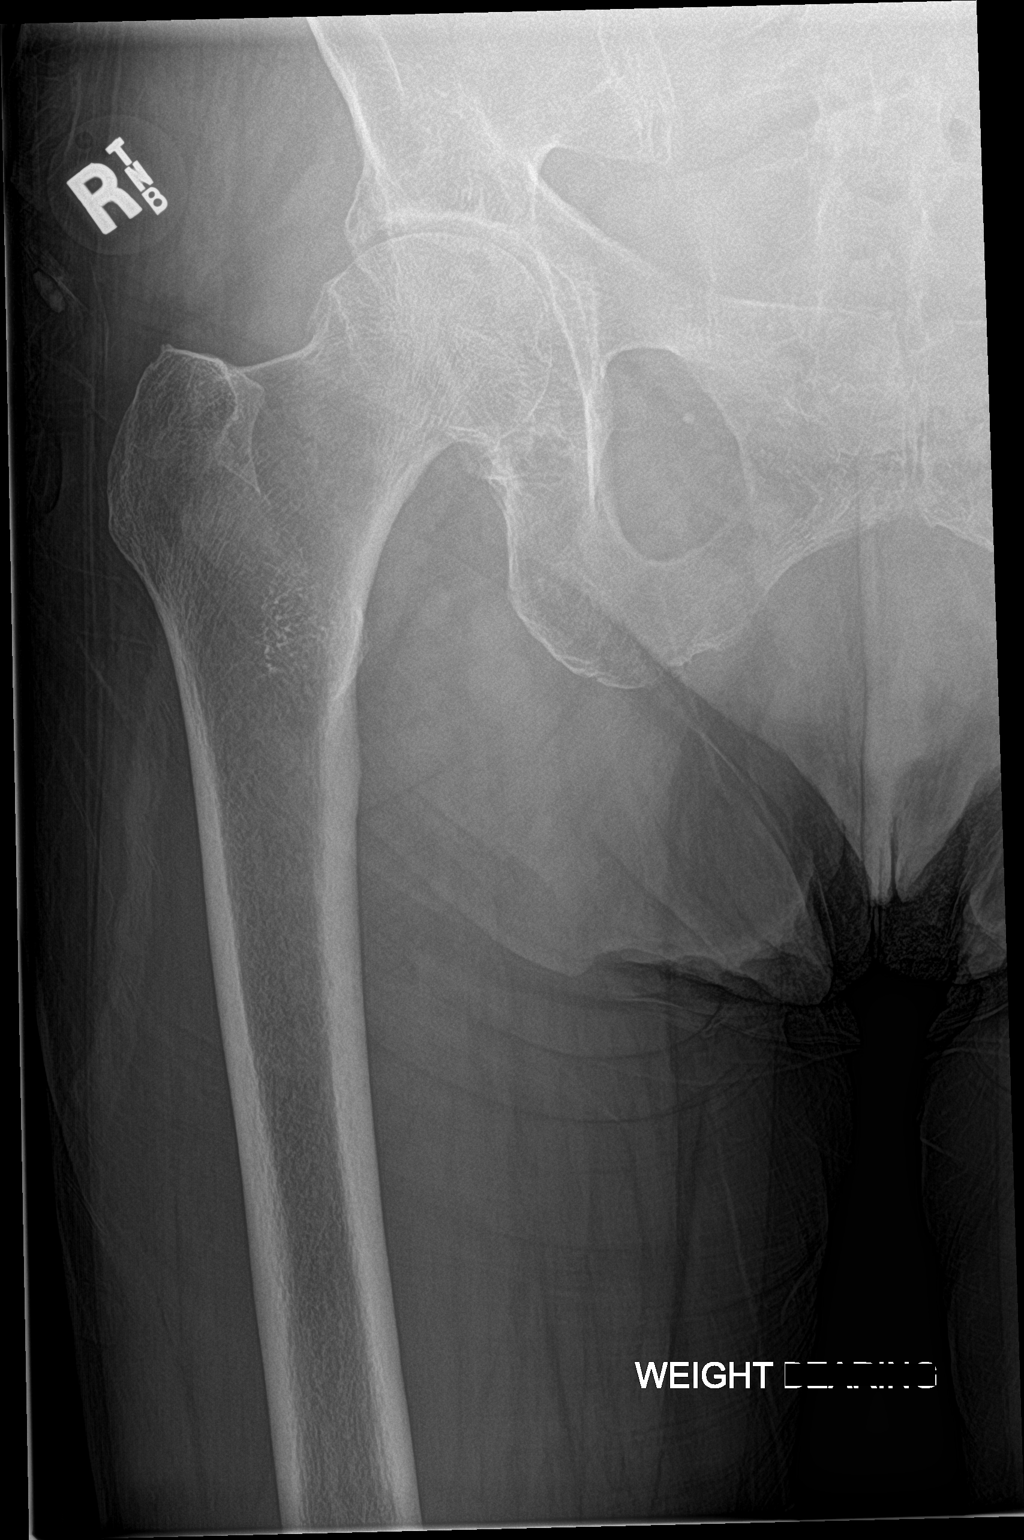

[hip lat]
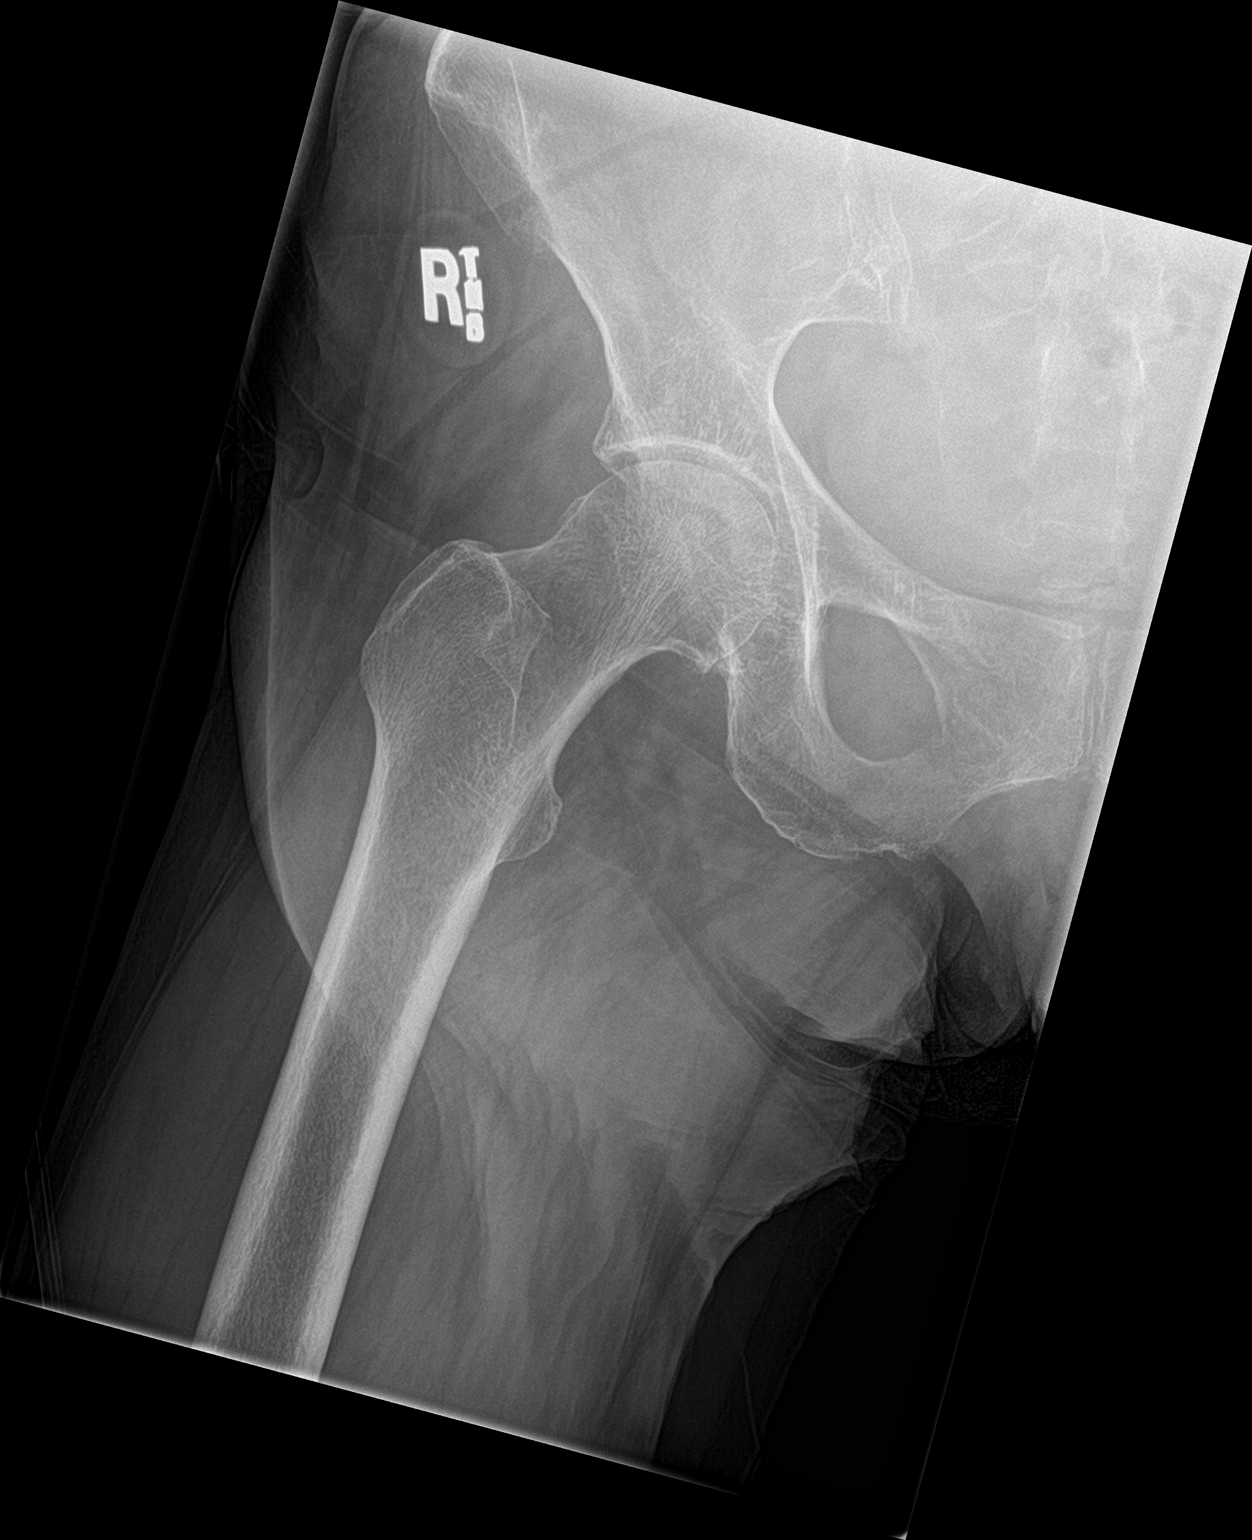

[3 of 3 positions shown; findings below may reference images not displayed]

FINDINGS: The bony pelvis is subjectively osteopenic. There is no acute or
healing fracture. There is no lytic nor blastic lesion.

There is moderate symmetric narrowing of the right hip joint. The
articular surfaces of the femoral head and acetabulum remains
smoothly rounded. Small osteophytes arise from the superior and
inferior articular margins of the femoral head. The femoral neck,
intertrochanteric, and subtrochanteric regions appear normal.
IMPRESSION: There is moderate osteoarthritic joint space loss of the right hip
with small femoral head marginal osteophytes. There is no acute bony
abnormality.

## 2018-02-12 NOTE — Patient Instructions (Signed)
Medication Instructions:  Your physician recommends that you continue on your current medications as directed. Please refer to the Current Medication list given to you today.   Labwork: NONE  Testing/Procedures: Your physician has requested that you have an echocardiogram. Echocardiography is a painless test that uses sound waves to create images of your heart. It provides your doctor with information about the size and shape of your heart and how well your heart's chambers and valves are working. This procedure takes approximately one hour. There are no restrictions for this procedure.  Your physician has requested that you have a lexiscan myoview. For further information please visit [email or web address]. Please follow instruction sheet, as given.    Follow-Up: Your physician recommends that you schedule a follow-up appointment in: 2 MONTHS    Any Other Special Instructions Will Be Listed Below (If Applicable).     If you need a refill on your cardiac medications before your next appointment, please call your pharmacy.

## 2018-02-12 NOTE — Progress Notes (Signed)
CARDIOLOGY CONSULT NOTE  Patient ID: [name] MRN: [id number] DOB/AGE: 1940/12/08 77 y.o.  Admit date: (Not on file) Primary Physician: [name], MD Referring Physician: [name], MD  Reason for Consultation: Coronary artery calcifications and exertional dyspnea  HPI: [name] is a 77 y.o. female who is being seen today for the evaluation of coronary artery calcifications and exertional dyspnea at the request of [name], MD.   She underwent a CT scan of the chest in August 2018 to monitor for recurrence of lung cancer.  It demonstrated aortic atherosclerosis in addition to left main and left circumflex coronary disease.  It also demonstrated increasing anterior pericardial fluid and/or thickening.  She has also been complaining of progressive exertional dyspnea as per PCP note review dated 01/07/2018.  She apparently used to be able to walk to her mailbox up a mild incline but can no longer do so due to increasing exertional dyspnea.  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  She appears to have a chronic pericardial effusion.  Most recent echocardiogram dated 06/14/2017 demonstrated normal left ventricular systolic function and regional wall motion, LVEF 60 to 65%, mild LVH, grade 1 diastolic dysfunction, and a small localized pericardial effusion adjacent to the right atrium with no evidence of tamponade physiology.  She has 3.36 cm distal abdominal aortic dilatation as noted by ultrasound on 05/15/2017.  Carotid Dopplers on 05/15/2017 showed 1 to 39% stenosis of the left internal carotid artery which is unchanged from the prior exam.  She has a history of right carotid endarterectomy in November 2014.  I personally reviewed the ECG performed today which demonstrated sinus bradycardia, 59 bpm, late R wave transition, right axis deviation, and diffuse nonspecific T wave abnormality's.  She has been experiencing  exertional dyspnea for the past 3 months.  It began occurring before she required the use of oxygen.  She denies chest pain but does have an occasional "air bubble" sensation in her chest which she thinks might be related to gas.  She also said she found out about 2 months ago that she was exposed to black mold in her apartment complex.  She is originally from the [facility] and moved to [facility] after graduating from high school.  She is divorced.  Her daughter died of breast cancer at the age of 74 years ago on 02/15/2018.  She also has twin boys who were 72 months younger than her daughter.     Allergies  Allergen Reactions  . Ciprofloxacin Hcl Hives  . Iohexol      Desc: Per [facility], pt is allergic to IV contrast, no type of reaction was available. Pt cannot remember but first reacted around 15 yrs ago from an IVP. Notes were date from 2009 at urology center., Onset Date: [id number]   . Prozac [Fluoxetine Hcl] Rash  . Sulfonamide Derivatives Hives and Rash    Current Outpatient Medications  Medication Sig Dispense Refill  . acetaminophen (TYLENOL) 325 MG tablet Take 650 mg by mouth every 6 (six) hours as needed (FOR PAIN.).    Marland Kitchen ALPRAZolam (XANAX) 0.5 MG tablet Take 1 tablet (0.5 mg total) by mouth 2 (two) times daily as needed for anxiety. 60 tablet 0  . Calcium-Magnesium-Vitamin D (CITRACAL CALCIUM+D) 600-40-500 MG-MG-UNIT TB24 Take 1 tablet by mouth daily.    . cholecalciferol (VITAMIN D) 1000 units tablet Take 1,000 Units by mouth daily.    Marland Kitchen  citalopram (CELEXA) 40 MG tablet Take 0.5 tablets (20 mg total) by mouth at bedtime. 90 tablet 4  . levothyroxine (SYNTHROID, LEVOTHROID) 50 MCG tablet Take 1 tablet (50 mcg total) by mouth daily before breakfast. 90 tablet 3  . lubiprostone (AMITIZA) 24 MCG capsule Take 1 capsule (24 mcg total) by mouth 2 (two) times daily with a meal. 180 capsule 3  . magnesium oxide (MAG-OX) 400 MG tablet Take 400 mg by mouth 2 (two)  times daily.    . Misc Natural Products (SUPER GREENS PO) Take 1 Scoop by mouth every evening.    . Multiple Vitamin (MULTIVITAMIN WITH MINERALS) TABS tablet Take 1 tablet by mouth daily.    Marland Kitchen omeprazole (PRILOSEC) 20 MG capsule 1 PO 30 MINS PRIOR TO BREAKFAST. 90 capsule 3  . ondansetron (ZOFRAN) 4 MG tablet Take 1 tablet (4 mg total) by mouth 3 (three) times daily as needed for nausea or vomiting. 90 tablet 0  . umeclidinium-vilanterol (ANORO ELLIPTA) 62.5-25 MCG/INH AEPB Inhale 1 puff into the lungs daily. 90 each 3   No current facility-administered medications for this visit.     Past Medical History:  Diagnosis Date  . Adenocarcinoma of lung ([place]) 12/2010   left lung/surg only  . Allergic rhinitis   . 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 Anxiety disorder   . Aortic aneurysm ([place])   . Complication of anesthesia 2011   bloodpressure dropped during colonoscopy, not problems since  . Compression fracture   . COPD (chronic obstructive pulmonary disease) ([place])   . Depression   . Diastolic dysfunction   . Diverticulosis   . Dysphagia   . Emphysema lung ([place]) 11/08/2014  . Hiatal hernia   . Hypothyroidism   . IBS (irritable bowel syndrome)   . Iron deficiency anemia due to chronic blood loss 12/05/2016  . On home O2 12/15/2013   chronic hypoxia  . PUD (peptic ulcer disease)    28yrs  . Shortness of breath    with exertion  . SIADH (syndrome of inappropriate ADH production) ([place])   . Trigeminal neuralgia     Past Surgical History:  Procedure Laterality Date  . ABDOMINAL HYSTERECTOMY    . BACK SURGERY  January 13, 2014  . CATARACT EXTRACTION    . CATARACT EXTRACTION W/PHACO  09/02/2012   Procedure: CATARACT EXTRACTION PHACO AND INTRAOCULAR LENS PLACEMENT (IOC);  Surgeon: Elta Guadeloupe T. Gershon Crane, MD;  Location: [facility];  Service: Ophthalmology;  Laterality: Left;  CDE:10.35  . CHOLECYSTECTOMY    . COLONOSCOPY  2011   hyperplastic polyp, 3-4 small cecal  AVMs, nonbleeding  . COLONOSCOPY N/A 11/23/2014   Dr. Oneida Alar: moderate diverticulosis, hemorrhoids, redundant colon. next TCS in 10-15 years.   Marland Kitchen ENDARTERECTOMY Right 08/21/2013   Procedure: RIGHT CAROTID ANEURYSM RESECTION;  Surgeon: Rosetta Posner, MD;  Location: [facility] OR;  Service: Vascular;  Laterality: Right;  . ESOPHAGOGASTRODUODENOSCOPY  11/13/2009   w/dilation to 73mm, gastric ulceration (H.Pylori) s/p treatment  . ESOPHAGOGASTRODUODENOSCOPY  11/21/2009   distal esophageal web, gastritis  . ESOPHAGOGASTRODUODENOSCOPY  03/14/12   DZH:GDJMEQAST in the distal esophagus/Mild gastritis/small HH. + H.pylori, prescribed Pylera. Finished treatment.   . ESOPHAGOGASTRODUODENOSCOPY N/A 12/13/2017   Procedure: ESOPHAGOGASTRODUODENOSCOPY (EGD);  Surgeon: Danie Binder, MD;  [facility];  Service: Endoscopy;  Laterality: N/A;  8:30am  . fatty tumor removal from lt groin    . FRACTURE SURGERY Left    hand and left ring finger  .  GIVENS CAPSULE STUDY N/A 12/26/2017   Procedure: GIVENS CAPSULE STUDY;  Surgeon: Danie Binder, MD;  [facility];  Service: Endoscopy;  Laterality: N/A;  7:30am  . LUNG CANCER SURGERY  12/2010   Left VATS, minithoracotomy, LLL superior segmentectomy  . ORIF WRIST FRACTURE Left 04/09/2014   Procedure: OPEN REDUCTION INTERNAL FIXATION (ORIF) WRIST FRACTURE;  Surgeon: Renette Butters, MD;  Location: [place];  Service: Orthopedics;  Laterality: Left;  . PERCUTANEOUS PINNING Left 04/09/2014   Procedure: PERCUTANEOUS PINNING EXTREMITY;  Surgeon: Renette Butters, MD;  Location: [place];  Service: Orthopedics;  Laterality: Left;  . SAVORY DILATION N/A 12/13/2017   Procedure: SAVORY DILATION;  Surgeon: Danie Binder, MD;  [facility];  Service: Endoscopy;  Laterality: N/A;  . TUBAL LIGATION    . vocal cord surgery  02/06/2011   laryngoscopy with bilateral vocal cord Radiesse injection for vocal cord paralysis  . YAG LASER APPLICATION Left 07/37/1062    Procedure: YAG LASER APPLICATION;  Surgeon: Rutherford Guys, MD;  Location: [facility];  Service: Ophthalmology;  Laterality: Left;    Social History   Socioeconomic History  . Marital status: Divorced    Spouse name: Not on file  . Number of children: Not on file  . Years of education: Not on file  . Highest education level: Not on file  Occupational History  . Not on file  Social Needs  . Financial resource strain: Not on file  . Food insecurity:    Worry: Not on file    Inability: Not on file  . Transportation needs:    Medical: Not on file    Non-medical: Not on file  Tobacco Use  . Smoking status: Former Smoker    Packs/day: 0.50    Years: 20.00    Pack years: 10.00    Types: Cigarettes    Last attempt to quit: 12/21/2010    Years since quitting: 7.1  . Smokeless tobacco: Never Used  . Tobacco comment: smoking cessation info given and reviewed   Substance and Sexual Activity  . Alcohol use: Yes    Alcohol/week: 0.0 oz    Comment: seldom  . Drug use: No  . Sexual activity: Yes    Birth control/protection: Surgical  Lifestyle  . Physical activity:    Days per week: Not on file    Minutes per session: Not on file  . Stress: Not on file  Relationships  . Social connections:    Talks on phone: Not on file    Gets together: Not on file    Attends religious service: Not on file    Active member of club or organization: Not on file    Attends meetings of clubs or organizations: Not on file    Relationship status: Not on file  . Intimate partner violence:    Fear of current or ex partner: Not on file    Emotionally abused: Not on file    Physically abused: Not on file    Forced sexual activity: Not on file  Other Topics Concern  . Not on file  Social History Narrative  . Not on file     No family history of premature CAD in 1st degree relatives.  Current Meds  Medication Sig  . acetaminophen (TYLENOL) 325 MG tablet Take 650 mg by mouth every 6 (six) hours as  needed (FOR PAIN.).  Marland Kitchen ALPRAZolam (XANAX) 0.5 MG tablet Take 1 tablet (0.5 mg total) by mouth 2 (  two) times daily as needed for anxiety.  . Calcium-Magnesium-Vitamin D (CITRACAL CALCIUM+D) 600-40-500 MG-MG-UNIT TB24 Take 1 tablet by mouth daily.  . cholecalciferol (VITAMIN D) 1000 units tablet Take 1,000 Units by mouth daily.  . citalopram (CELEXA) 40 MG tablet Take 0.5 tablets (20 mg total) by mouth at bedtime.  Marland Kitchen levothyroxine (SYNTHROID, LEVOTHROID) 50 MCG tablet Take 1 tablet (50 mcg total) by mouth daily before breakfast.  . lubiprostone (AMITIZA) 24 MCG capsule Take 1 capsule (24 mcg total) by mouth 2 (two) times daily with a meal.  . magnesium oxide (MAG-OX) 400 MG tablet Take 400 mg by mouth 2 (two) times daily.  . Misc Natural Products (SUPER GREENS PO) Take 1 Scoop by mouth every evening.  . Multiple Vitamin (MULTIVITAMIN WITH MINERALS) TABS tablet Take 1 tablet by mouth daily.  Marland Kitchen omeprazole (PRILOSEC) 20 MG capsule 1 PO 30 MINS PRIOR TO BREAKFAST.  Marland Kitchen ondansetron (ZOFRAN) 4 MG tablet Take 1 tablet (4 mg total) by mouth 3 (three) times daily as needed for nausea or vomiting.  . umeclidinium-vilanterol (ANORO ELLIPTA) 62.5-25 MCG/INH AEPB Inhale 1 puff into the lungs daily.      Review of systems complete and found to be negative unless listed above in HPI    Physical exam Blood pressure 118/70, pulse 67, height 5\' 5"  (1.651 m), weight 114 lb (51.7 kg), SpO2 92 %. General: NAD Neck: No JVD, no thyromegaly or thyroid nodule.  Lungs: Diminished sounds throughout with poor air movement, no crackles or wheezes. CV: Nondisplaced PMI. Regular rate and rhythm, normal S1/S2, no S3/S4, no murmur.  No peripheral edema.    Abdomen: Soft, nontender, no distention.  Skin: Intact without lesions or rashes.  Neurologic: Alert and oriented x 3.  Psych: Normal affect. Extremities: No clubbing or cyanosis.  HEENT: Normal.   ECG: Most recent ECG reviewed.   Labs: Lab Results  Component  Value Date/Time   K 4.0 12/26/2017 08:08 AM   BUN 9 12/26/2017 08:08 AM   CREATININE 0.55 12/26/2017 08:08 AM   CREATININE 0.40 (L) 11/01/2016 10:56 AM   ALT 14 12/26/2017 08:08 AM   TSH 1.763 05/13/2017 02:31 PM   TSH 1.25 11/01/2016 10:56 AM   HGB 13.9 12/26/2017 08:08 AM     Lipids: Lab Results  Component Value Date/Time   LDLCALC 127 (H) 10/25/2014 11:27 AM   CHOL 217 (H) 10/25/2014 11:27 AM   TRIG 86 10/25/2014 11:27 AM   HDL 73 10/25/2014 11:27 AM        ASSESSMENT AND PLAN:  1.  Progressive exertional dyspnea with coronary artery calcifications and pericardial effusion: All symptoms may be explained by severe COPD and potentially relatively recent exposure to black mold. She certainly has risk factors for ischemic heart disease. I will proceed with a nuclear myocardial perfusion imaging study to evaluate for ischemic heart disease (Lexiscan Myoview). I will also obtain an echocardiogram to assess for interval changes in the size of the pericardial effusion noted in September 2018.  2.  COPD with emphysema and recent black mold exposure: This may also be contributing to progressive exertional dyspnea.  She may need to see a pulmonologist and may require bronchoscopy.    Disposition: Follow up in 2 months  Signed: Kate Sable, M.D., F.A.C.C.  02/12/2018, 9:14 AM

## 2018-02-13 ENCOUNTER — Encounter: Payer: Self-pay | Admitting: Family Medicine

## 2018-02-13 DIAGNOSIS — J449 Chronic obstructive pulmonary disease, unspecified: Secondary | ICD-10-CM

## 2018-02-13 DIAGNOSIS — Z77120 Contact with and (suspected) exposure to mold (toxic): Secondary | ICD-10-CM

## 2018-02-14 ENCOUNTER — Encounter: Payer: Self-pay | Admitting: Family Medicine

## 2018-02-14 ENCOUNTER — Encounter: Payer: Self-pay | Admitting: Cardiovascular Disease

## 2018-02-18 ENCOUNTER — Encounter: Payer: Self-pay | Admitting: Family Medicine

## 2018-02-21 ENCOUNTER — Encounter (HOSPITAL_BASED_OUTPATIENT_CLINIC_OR_DEPARTMENT_OTHER)
Admission: RE | Admit: 2018-02-21 | Discharge: 2018-02-21 | Disposition: A | Discharge status: Home / Self Care | Payer: PPO | Source: Ambulatory Visit | Attending: Cardiovascular Disease | Admitting: Cardiovascular Disease

## 2018-02-21 ENCOUNTER — Encounter: Payer: Self-pay | Admitting: Family Medicine

## 2018-02-21 ENCOUNTER — Telehealth: Payer: Self-pay | Admitting: *Deleted

## 2018-02-21 ENCOUNTER — Other Ambulatory Visit: Payer: Self-pay | Admitting: Family Medicine

## 2018-02-21 ENCOUNTER — Encounter (HOSPITAL_COMMUNITY)
Admission: RE | Admit: 2018-02-21 | Discharge: 2018-02-21 | Disposition: A | Discharge status: Home / Self Care | Payer: PPO | Source: Ambulatory Visit | Attending: Cardiovascular Disease | Admitting: Cardiovascular Disease

## 2018-02-21 ENCOUNTER — Ambulatory Visit (HOSPITAL_COMMUNITY)
Admission: RE | Admit: 2018-02-21 | Discharge: 2018-02-21 | Disposition: A | Discharge status: Home / Self Care | Payer: PPO | Source: Ambulatory Visit | Attending: Cardiovascular Disease | Admitting: Cardiovascular Disease

## 2018-02-21 DIAGNOSIS — R0602 Shortness of breath: Secondary | ICD-10-CM | POA: Diagnosis not present

## 2018-02-21 DIAGNOSIS — Z87891 Personal history of nicotine dependence: Secondary | ICD-10-CM | POA: Insufficient documentation

## 2018-02-21 DIAGNOSIS — R0609 Other forms of dyspnea: Secondary | ICD-10-CM | POA: Insufficient documentation

## 2018-02-21 DIAGNOSIS — R062 Wheezing: Secondary | ICD-10-CM

## 2018-02-21 DIAGNOSIS — I313 Pericardial effusion (noninflammatory): Secondary | ICD-10-CM | POA: Insufficient documentation

## 2018-02-21 DIAGNOSIS — J449 Chronic obstructive pulmonary disease, unspecified: Secondary | ICD-10-CM | POA: Insufficient documentation

## 2018-02-21 DIAGNOSIS — Z85118 Personal history of other malignant neoplasm of bronchus and lung: Secondary | ICD-10-CM | POA: Insufficient documentation

## 2018-02-21 DIAGNOSIS — I309 Acute pericarditis, unspecified: Secondary | ICD-10-CM | POA: Diagnosis not present

## 2018-02-21 DIAGNOSIS — I081 Rheumatic disorders of both mitral and tricuspid valves: Secondary | ICD-10-CM | POA: Insufficient documentation

## 2018-02-21 DIAGNOSIS — E785 Hyperlipidemia, unspecified: Secondary | ICD-10-CM | POA: Insufficient documentation

## 2018-02-21 DIAGNOSIS — Z77120 Contact with and (suspected) exposure to mold (toxic): Secondary | ICD-10-CM

## 2018-02-21 DIAGNOSIS — R0600 Dyspnea, unspecified: Secondary | ICD-10-CM

## 2018-02-21 LAB — NM MYOCAR MULTI W/SPECT W/WALL MOTION / EF
LV dias vol: 67 mL (ref 46–106)
LV sys vol: 21 mL
Peak HR: 83 {beats}/min
RATE: 0.53
Rest HR: 59 {beats}/min
SDS: 3
SRS: 6
SSS: 9
TID: 1

## 2018-02-21 MED ORDER — TECHNETIUM TC 99M TETROFOSMIN IV KIT
10.0000 | PACK | Freq: Once | INTRAVENOUS | Status: AC | PRN
  Administered 2018-02-21: 9.7 via INTRAVENOUS

## 2018-02-21 MED ORDER — TECHNETIUM TC 99M TETROFOSMIN IV KIT
30.0000 | PACK | Freq: Once | INTRAVENOUS | Status: AC | PRN
  Administered 2018-02-21: 31 via INTRAVENOUS

## 2018-02-21 MED ORDER — REGADENOSON 0.4 MG/5ML IV SOLN
INTRAVENOUS | Status: AC
  Administered 2018-02-21: 0.4 mg via INTRAVENOUS
  Filled 2018-02-21: qty 5

## 2018-02-21 MED ORDER — SODIUM CHLORIDE 0.9% FLUSH
INTRAVENOUS | Status: AC
  Administered 2018-02-21: 10 mL via INTRAVENOUS
  Filled 2018-02-21: qty 10

## 2018-02-21 NOTE — Telephone Encounter (Signed)
Patient informed. 

## 2018-02-21 NOTE — Progress Notes (Signed)
*  PRELIMINARY RESULTS* Echocardiogram 2D Echocardiogram has been performed.  [name] 02/21/2018, 9:35 AM

## 2018-02-21 NOTE — Telephone Encounter (Signed)
-----   Message from Herminio Commons, MD sent at 02/21/2018  2:41 PM EDT ----- Normal pumping function. Very small fluid collection near heart.

## 2018-02-21 NOTE — Telephone Encounter (Signed)
-----   Message from Herminio Commons, MD sent at 02/21/2018  3:24 PM EDT ----- Low risk for blockages.

## 2018-02-24 ENCOUNTER — Encounter (HOSPITAL_COMMUNITY)
Admission: RE | Disposition: A | Discharge status: Home / Self Care | Payer: Self-pay | Source: Ambulatory Visit | Attending: Gastroenterology

## 2018-02-24 ENCOUNTER — Ambulatory Visit (HOSPITAL_COMMUNITY)
Admission: RE | Admit: 2018-02-24 | Discharge: 2018-02-24 | Disposition: A | Discharge status: Home / Self Care | Payer: PPO | Source: Ambulatory Visit | Attending: Gastroenterology | Admitting: Gastroenterology

## 2018-02-24 ENCOUNTER — Encounter (HOSPITAL_COMMUNITY): Payer: Self-pay | Admitting: *Deleted

## 2018-02-24 ENCOUNTER — Other Ambulatory Visit: Payer: Self-pay

## 2018-02-24 ENCOUNTER — Inpatient Hospital Stay (HOSPITAL_COMMUNITY): Payer: PPO | Attending: Hematology

## 2018-02-24 DIAGNOSIS — R1310 Dysphagia, unspecified: Secondary | ICD-10-CM | POA: Diagnosis not present

## 2018-02-24 DIAGNOSIS — K589 Irritable bowel syndrome without diarrhea: Secondary | ICD-10-CM | POA: Insufficient documentation

## 2018-02-24 DIAGNOSIS — Q394 Esophageal web: Secondary | ICD-10-CM | POA: Diagnosis not present

## 2018-02-24 DIAGNOSIS — E039 Hypothyroidism, unspecified: Secondary | ICD-10-CM | POA: Insufficient documentation

## 2018-02-24 DIAGNOSIS — Z9981 Dependence on supplemental oxygen: Secondary | ICD-10-CM | POA: Insufficient documentation

## 2018-02-24 DIAGNOSIS — F419 Anxiety disorder, unspecified: Secondary | ICD-10-CM | POA: Diagnosis not present

## 2018-02-24 DIAGNOSIS — E871 Hypo-osmolality and hyponatremia: Secondary | ICD-10-CM | POA: Insufficient documentation

## 2018-02-24 DIAGNOSIS — D509 Iron deficiency anemia, unspecified: Secondary | ICD-10-CM | POA: Diagnosis not present

## 2018-02-24 DIAGNOSIS — K2970 Gastritis, unspecified, without bleeding: Secondary | ICD-10-CM | POA: Diagnosis not present

## 2018-02-24 DIAGNOSIS — Z79890 Hormone replacement therapy (postmenopausal): Secondary | ICD-10-CM | POA: Insufficient documentation

## 2018-02-24 DIAGNOSIS — K3189 Other diseases of stomach and duodenum: Secondary | ICD-10-CM | POA: Diagnosis not present

## 2018-02-24 DIAGNOSIS — K311 Adult hypertrophic pyloric stenosis: Secondary | ICD-10-CM | POA: Insufficient documentation

## 2018-02-24 DIAGNOSIS — D649 Anemia, unspecified: Secondary | ICD-10-CM | POA: Diagnosis not present

## 2018-02-24 DIAGNOSIS — Z8711 Personal history of peptic ulcer disease: Secondary | ICD-10-CM | POA: Diagnosis not present

## 2018-02-24 DIAGNOSIS — Z85118 Personal history of other malignant neoplasm of bronchus and lung: Secondary | ICD-10-CM | POA: Insufficient documentation

## 2018-02-24 DIAGNOSIS — J439 Emphysema, unspecified: Secondary | ICD-10-CM | POA: Insufficient documentation

## 2018-02-24 DIAGNOSIS — Z9049 Acquired absence of other specified parts of digestive tract: Secondary | ICD-10-CM | POA: Diagnosis not present

## 2018-02-24 DIAGNOSIS — M810 Age-related osteoporosis without current pathological fracture: Secondary | ICD-10-CM | POA: Insufficient documentation

## 2018-02-24 DIAGNOSIS — F329 Major depressive disorder, single episode, unspecified: Secondary | ICD-10-CM | POA: Diagnosis not present

## 2018-02-24 DIAGNOSIS — Z87891 Personal history of nicotine dependence: Secondary | ICD-10-CM | POA: Diagnosis not present

## 2018-02-24 DIAGNOSIS — Z79899 Other long term (current) drug therapy: Secondary | ICD-10-CM | POA: Insufficient documentation

## 2018-02-24 DIAGNOSIS — D500 Iron deficiency anemia secondary to blood loss (chronic): Secondary | ICD-10-CM

## 2018-02-24 HISTORY — PX: GIVENS CAPSULE STUDY: SHX5432

## 2018-02-24 HISTORY — PX: ESOPHAGOGASTRODUODENOSCOPY: SHX5428

## 2018-02-24 HISTORY — PX: ESOPHAGEAL DILATION: SHX303

## 2018-02-24 LAB — CBC WITH DIFFERENTIAL/PLATELET
Basophils Absolute: 0 10*3/uL (ref 0.0–0.1)
Basophils Relative: 1 %
Eosinophils Absolute: 0.2 10*3/uL (ref 0.0–0.7)
Eosinophils Relative: 3 %
HCT: 39.3 % (ref 36.0–46.0)
Hemoglobin: 13 g/dL (ref 12.0–15.0)
Lymphocytes Relative: 25 %
Lymphs Abs: 1.2 10*3/uL (ref 0.7–4.0)
MCH: 29.4 pg (ref 26.0–34.0)
MCHC: 33.1 g/dL (ref 30.0–36.0)
MCV: 88.9 fL (ref 78.0–100.0)
Monocytes Absolute: 0.4 10*3/uL (ref 0.1–1.0)
Monocytes Relative: 9 %
Neutro Abs: 2.9 10*3/uL (ref 1.7–7.7)
Neutrophils Relative %: 62 %
Platelets: 279 10*3/uL (ref 150–400)
RBC: 4.42 MIL/uL (ref 3.87–5.11)
RDW: 14.4 % (ref 11.5–15.5)
WBC: 4.7 10*3/uL (ref 4.0–10.5)

## 2018-02-24 LAB — IRON AND TIBC
Iron: 96 ug/dL (ref 28–170)
Saturation Ratios: 26 % (ref 10.4–31.8)
TIBC: 367 ug/dL (ref 250–450)
UIBC: 271 ug/dL

## 2018-02-24 LAB — FERRITIN: Ferritin: 35 ng/mL (ref 11–307)

## 2018-02-24 SURGERY — EGD (ESOPHAGOGASTRODUODENOSCOPY)
Anesthesia: Moderate Sedation

## 2018-02-24 MED ORDER — MEPERIDINE HCL 100 MG/ML IJ SOLN
INTRAMUSCULAR | Status: DC | PRN
  Administered 2018-02-24 (×2): 25 mg

## 2018-02-24 MED ORDER — SODIUM CHLORIDE 0.9 % IV SOLN
INTRAVENOUS | Status: DC
  Administered 2018-02-24: 10:00:00 via INTRAVENOUS

## 2018-02-24 MED ORDER — MIDAZOLAM HCL 5 MG/5ML IJ SOLN
INTRAMUSCULAR | Status: DC | PRN
  Administered 2018-02-24: 1 mg via INTRAVENOUS
  Administered 2018-02-24: 2 mg via INTRAVENOUS

## 2018-02-24 MED ORDER — MEPERIDINE HCL 100 MG/ML IJ SOLN
INTRAMUSCULAR | Status: AC
  Filled 2018-02-24: qty 1

## 2018-02-24 MED ORDER — MIDAZOLAM HCL 5 MG/5ML IJ SOLN
INTRAMUSCULAR | Status: AC
  Filled 2018-02-24: qty 5

## 2018-02-24 MED ORDER — LIDOCAINE VISCOUS HCL 2 % MT SOLN
OROMUCOSAL | Status: AC
  Filled 2018-02-24: qty 15

## 2018-02-24 MED ORDER — MINERAL OIL PO OIL
TOPICAL_OIL | ORAL | Status: AC
  Filled 2018-02-24: qty 30

## 2018-02-24 NOTE — Discharge Instructions (Signed)
I dilated your esophagus. You have a stricture near the top and base of your esophagus. You have mild gastritis. I PLACED the CAPSULE TO EVALUATE YOUR SMALL BOWEL.     CONTINUE OMEPRAZOLE.  YOUR RESULTS WILL BE AVAILABLE BY MAY 29.   FOLLOW UP IN JUN 2019. UPPER ENDOSCOPY AFTER CARE Read the instructions outlined below and refer to this sheet in the next week. These discharge instructions provide you with general information on caring for yourself after you leave the hospital. While your treatment has been planned according to the most current medical practices available, unavoidable complications occasionally occur. If you have any problems or questions after discharge, call DR. Quintina Hakeem, [phone].  ACTIVITY  You may resume your regular activity, but move at a slower pace for the next 24 hours.   Take frequent rest periods for the next 24 hours.   Walking will help get rid of the air and reduce the bloated feeling in your belly (abdomen).   No driving for 24 hours (because of the medicine (anesthesia) used during the test).   You may shower.   Do not sign any important legal documents or operate any machinery for 24 hours (because of the anesthesia used during the test).    NUTRITION  Drink plenty of fluids.   You may resume your normal diet as instructed by your doctor.   Begin with a light meal and progress to your normal diet. Heavy or fried foods are harder to digest and may make you feel sick to your stomach (nauseated).   Avoid alcoholic beverages for 24 hours or as instructed.    MEDICATIONS  You may resume your normal medications.   WHAT YOU CAN EXPECT TODAY  Some feelings of bloating in the abdomen.   Passage of more gas than usual.    IF YOU HAD A BIOPSY TAKEN DURING THE UPPER ENDOSCOPY:    Eat a soft diet IF YOU HAVE NAUSEA, BLOATING, ABDOMINAL PAIN, OR VOMITING.    FINDING OUT THE RESULTS OF YOUR TEST Not all test results are available during  your visit. DR. Oneida Alar WILL CALL YOU WITHIN 14 DAYS OF YOUR PROCEDUE WITH YOUR RESULTS. Do not assume everything is normal if you have not heard from DR. Montrae Braithwaite, CALL HER OFFICE AT [phone].  SEEK IMMEDIATE MEDICAL ATTENTION AND CALL THE OFFICE: [phone] IF:  You have more than a spotting of blood in your stool.   Your belly is swollen (abdominal distention).   You are nauseated or vomiting.   You have a temperature over 101F.   You have abdominal pain or discomfort that is severe or gets worse throughout the day.     Gastritis  Gastritis is an inflammation (the body's way of reacting to injury and/or infection) of the stomach. It is often caused by bacterial (germ) infections. It can also be caused BY ASPIRIN, BC/GOODY POWDER'S, (IBUPROFEN) MOTRIN, OR ALEVE (NAPROXEN), chemicals (including alcohol), SPICY FOODS, and medications. This illness may be associated with generalized malaise (feeling tired, not well), UPPER ABDOMINAL STOMACH cramps, and fever. One common bacterial cause of gastritis is an organism known as H. Pylori. This can be treated with antibiotics.     ESOPHAGEAL STRICTURE Esophageal strictures can be caused by stomach acid backing up into the tube that carries food from the mouth down to the stomach (lower esophagus).  TREATMENT There are a number of medicines used to treat reflux/stricture, including: Antacids.  Proton-pump inhibitors: OMEPRAZOLE  ZANTAC OR PEPCID  HOME CARE INSTRUCTIONS  Eat 2-3 hours before going to bed.  Try to reach and maintain a healthy weight.  Do not eat just a few very large meals. Instead, eat 4 TO 6 smaller meals throughout the day.  Try to identify foods and beverages that make your symptoms worse, and avoid these.  Avoid tight clothing.  Do not exercise right after eating.

## 2018-02-24 NOTE — Op Note (Signed)
[facility] Patient Name: [name] Procedure Date: 02/24/2018 10:08 AM MRN: [id number] Date of Birth: 09-22-1941 Attending MD: Barney Drain MD, MD CSN: [id number] Age: 77 Admit Type: Outpatient Procedure:                Upper GI endoscopy with GIVENS CAPSULE PLACEMENT                            FOLLOWED BY ESOPHAGEAL DILATION Indications:              Dysphagia, Anemia Providers:                Barney Drain MD, MD, Janeece Riggers, RN, Aram Candela Referring MD:             Cammie Mcgee. [place] Medicines:                Meperidine 50 mg IV, Midazolam 5 mg IV Complications:            No immediate complications. Estimated Blood Loss:     Estimated blood loss was minimal. Procedure:                Pre-Anesthesia Assessment:                           - Prior to the procedure, a History and Physical                            was performed, and patient medications and                            allergies were reviewed. The patient's tolerance of                            previous anesthesia was also reviewed. The risks                            and benefits of the procedure and the sedation                            options and risks were discussed with the patient.                            All questions were answered, and informed consent                            was obtained. Prior Anticoagulants: The patient has                            taken no previous anticoagulant or antiplatelet                            agents. ASA Grade Assessment: II - A patient with                            mild systemic disease. After reviewing the risks  and benefits, the patient was deemed in                            satisfactory condition to undergo the procedure.                            After obtaining informed consent, the endoscope was                            passed under direct vision. Throughout the                            procedure, the patient's  blood pressure, pulse, and                            oxygen saturations were monitored continuously. The                            EG-[id number] ([id number]) scope was introduced through the                            mouth, and advanced to the second part of duodenum.                            The upper GI endoscopy was somewhat difficult due                            to challenging esophageal intubation because of                            abnormal patient anatomy. The patient tolerated the                            procedure well. Scope In: 10:40:16 AM Scope Out: 11:09:33 AM Total Procedure Duration: 0 hours 29 minutes 17 seconds  Findings:      A web was found in the proximal esophagus and in the distal esophagus. A       guidewire was placed and the scope was withdrawn. Dilation was performed       with a Savary dilator with mild resistance at 17 mm and 18 mm. Estimated       blood loss: none.      Patchy mild inflammation characterized by congestion (edema) and       erythema was found in the entire examined stomach.      A benign-appearing, intrinsic mild stenosis was found at the pylorus.       This was traversed. MILD TEAR CREATED WHEN PASSING GIVENS CAPSULE INTO       DUODENUM. Small amount of BLEEDING NOTED INITIALLY ON GIVENS IMAGES AND       ON EXAM.      A mild post-ulcer deformity was found in the duodenal bulb CREATING AN       ACUTE ANGLE AT D1/D2.      The second portion of the duodenum was normal. 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 Dilated.                           - MILD Gastritis.                           - MILD PYLORIC stenosis.                           - MILD Duodenal deformity AT D1/D2. Moderate Sedation:      Moderate (conscious) sedation was administered by the endoscopy nurse       and supervised by the endoscopist. The following parameters were       monitored: oxygen  saturation, heart rate, blood pressure, and response       to care. Total physician intraservice time was 39 minutes. Recommendation:           - NPOAND THEN IN 2 HOURS CAN HAVE CLEAR LIQUIDS AND                            IN 4 HOURS LIGHT LUNCH. NOTHING RED UNTIL STUDY IS                            COMPLETE. THEN advance diet as tolerated.                           - Continue present medications.                           - Return to my office in 4 months.                           - Patient has a contact number available for                            emergencies. The signs and symptoms of potential                            delayed complications were discussed with the                            patient. Return to normal activities tomorrow.                            Written discharge instructions were provided to the                            patient. Procedure Code(s):        --- Professional ---                           [phone], Esophagogastroduodenoscopy, flexible,  transoral; with insertion of guide wire followed by                            passage of dilator(s) through esophagus over guide                            wire                           G0500, Moderate sedation services provided by the                            same physician or other qualified health care                            professional performing a gastrointestinal                            endoscopic service that sedation supports,                            requiring the presence of an independent trained                            observer to assist in the monitoring of the                            patient's level of consciousness and physiological                            status; initial 15 minutes of intra-service time;                            patient age 57 years or older (additional time may                            be reported with [phone], as appropriate)                            [phone], Moderate sedation services provided by the                            same physician or other qualified health care                            professional performing the diagnostic or                            therapeutic service that the sedation supports,                            requiring the presence of an independent trained                            observer  to assist in the monitoring of the                            patient's level of consciousness and physiological                            status; each additional 15 minutes intraservice                            time (List separately in addition to code for                            primary service)                           [phone], Moderate sedation services provided by the                            same physician or other qualified health care                            professional performing the diagnostic or                            therapeutic service that the sedation supports,                            requiring the presence of an independent trained                            observer to assist in the monitoring of the                            patient's level of consciousness and physiological                            status; each additional 15 minutes intraservice                            time (List separately in addition to code for                            primary service) Diagnosis Code(s):        --- Professional ---                           Q39.4, Esophageal web                           K29.70, Gastritis, unspecified, without bleeding                           K31.1, Adult hypertrophic pyloric stenosis                           K31.89, Other diseases  of stomach and duodenum                           R13.10, Dysphagia, unspecified                           D64.9, Anemia, unspecified CPT copyright 2017 [employer]. All rights reserved. The codes  documented in this report are preliminary and upon coder review may  be revised to meet current compliance requirements. Barney Drain, MD Barney Drain MD, MD 02/24/2018 11:26:15 AM This report has been signed electronically. Number of Addenda: 0

## 2018-02-24 NOTE — H&P (Signed)
Primary Care Physician:  [name], MD Primary Gastroenterologist:  Dr. Oneida Alar  Pre-Procedure History & Physical: HPI:  [name] is a 77 y.o. female here for [facility].  Past Medical History:  Diagnosis Date  . Adenocarcinoma of lung ([place]) 12/2010   left lung/surg only  . Allergic rhinitis   . 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 Anxiety disorder   . Aortic aneurysm ([place])   . Complication of anesthesia 2011   bloodpressure dropped during colonoscopy, not problems since  . Compression fracture   . COPD (chronic obstructive pulmonary disease) ([place])   . Depression   . Diastolic dysfunction   . Diverticulosis   . Dysphagia   . Emphysema lung ([place]) 11/08/2014  . Hiatal hernia   . Hypothyroidism   . IBS (irritable bowel syndrome)   . Iron deficiency anemia due to chronic blood loss 12/05/2016  . On home O2 12/15/2013   chronic hypoxia  . PUD (peptic ulcer disease)    15yrs  . Shortness of breath    with exertion  . SIADH (syndrome of inappropriate ADH production) ([place])   . Trigeminal neuralgia     Past Surgical History:  Procedure Laterality Date  . ABDOMINAL HYSTERECTOMY    . BACK SURGERY  January 13, 2014  . CATARACT EXTRACTION    . CATARACT EXTRACTION W/PHACO  09/02/2012   Procedure: CATARACT EXTRACTION PHACO AND INTRAOCULAR LENS PLACEMENT (IOC);  Surgeon: Elta Guadeloupe T. Gershon Crane, MD;  Location: [facility];  Service: Ophthalmology;  Laterality: Left;  CDE:10.35  . CHOLECYSTECTOMY    . 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 COLONOSCOPY N/A 11/23/2014   Dr. Oneida Alar: moderate diverticulosis, hemorrhoids, redundant colon. next TCS in 10-15 years.   Marland Kitchen ENDARTERECTOMY Right 08/21/2013   Procedure: RIGHT CAROTID ANEURYSM RESECTION;  Surgeon: Rosetta Posner, MD;  Location: [facility] OR;  Service: Vascular;  Laterality: Right;  . ESOPHAGOGASTRODUODENOSCOPY  11/13/2009   w/dilation to 81mm, gastric ulceration (H.Pylori)  s/p treatment  . ESOPHAGOGASTRODUODENOSCOPY  11/21/2009   distal esophageal web, gastritis  . ESOPHAGOGASTRODUODENOSCOPY  03/14/12   NTI:[id number] in the distal esophagus/Mild gastritis/small HH. + H.pylori, prescribed Pylera. Finished treatment.   . ESOPHAGOGASTRODUODENOSCOPY N/A 12/13/2017   Procedure: ESOPHAGOGASTRODUODENOSCOPY (EGD);  Surgeon: Danie Binder, MD;  Location: [facility];  Service: Endoscopy;  Laterality: N/A;  8:30am  . fatty tumor removal from lt groin    . FRACTURE SURGERY Left    hand and left ring finger  . GIVENS CAPSULE STUDY N/A 12/26/2017   Procedure: GIVENS CAPSULE STUDY;  Surgeon: Danie Binder, MD;  Location: [facility];  Service: Endoscopy;  Laterality: N/A;  7:30am  . LUNG CANCER SURGERY  12/2010   Left VATS, minithoracotomy, LLL superior segmentectomy  . ORIF WRIST FRACTURE Left 04/09/2014   Procedure: OPEN REDUCTION INTERNAL FIXATION (ORIF) WRIST FRACTURE;  Surgeon: Renette Butters, MD;  Location: [place];  Service: Orthopedics;  Laterality: Left;  . PERCUTANEOUS PINNING Left 04/09/2014   Procedure: PERCUTANEOUS PINNING EXTREMITY;  Surgeon: Renette Butters, MD;  Location: [place];  Service: Orthopedics;  Laterality: Left;  . SAVORY DILATION N/A 12/13/2017   Procedure: SAVORY DILATION;  Surgeon: Danie Binder, MD;  Location: [facility];  Service: Endoscopy;  Laterality: N/A;  . TUBAL LIGATION    . vocal cord surgery  02/06/2011   laryngoscopy with bilateral vocal cord Radiesse injection for vocal cord paralysis  .  YAG LASER APPLICATION Left 29/51/8841   Procedure: YAG LASER APPLICATION;  Surgeon: Rutherford Guys, MD;  Location: [facility];  Service: Ophthalmology;  Laterality: Left;    Prior to Admission medications   Medication Sig Start Date End Date Taking? Authorizing Provider  ALPRAZolam Duanne Moron) 0.5 MG tablet Take 1 tablet (0.5 mg total) by mouth 2 (two) times daily as needed for anxiety. Patient taking differently: Take 0.5 mg by mouth at bedtime.  01/17/18   Yes [name], MD  Calcium-Magnesium-Vitamin D (CITRACAL CALCIUM+D) 600-40-500 MG-MG-UNIT TB24 Take 1 tablet by mouth daily.   Yes [provider]  citalopram (CELEXA) 40 MG tablet Take 0.5 tablets (20 mg total) by mouth at bedtime. 01/17/18  Yes [name], MD  levothyroxine (SYNTHROID, LEVOTHROID) 50 MCG tablet Take 1 tablet (50 mcg total) by mouth daily before breakfast. 01/17/18  Yes [name], MD  lubiprostone (AMITIZA) 24 MCG capsule Take 1 capsule (24 mcg total) by mouth 2 (two) times daily with a meal. 01/17/18  Yes [name], MD  magnesium oxide (MAG-OX) 400 MG tablet Take 400 mg by mouth every evening.    Yes [provider]  Misc Natural Products (SUPER GREENS PO) Take 1 Scoop by mouth every evening.   Yes [provider]  Multiple Vitamin (MULTIVITAMIN WITH MINERALS) TABS tablet Take 1 tablet by mouth daily.   Yes [provider]  omeprazole (PRILOSEC) 20 MG capsule 1 PO 30 MINS PRIOR TO BREAKFAST. Patient taking differently: Take 20 mg by mouth daily.  12/13/17  Yes Lakin Rhine L, MD  umeclidinium-vilanterol (ANORO ELLIPTA) 62.5-25 MCG/INH AEPB Inhale 1 puff into the lungs daily. 04/25/17  Yes [name], MD  acetaminophen (TYLENOL) 325 MG tablet Take 650 mg by mouth every 6 (six) hours as needed for moderate pain or headache.     [provider]  ondansetron (ZOFRAN) 4 MG tablet Take 1 tablet (4 mg total) by mouth 3 (three) times daily as needed for nausea or vomiting. Patient not taking: Reported on 02/17/2018 11/14/16   [name], MD    Allergies as of 01/06/2018 - Review Complete 12/13/2017  Allergen Reaction Noted  . Ciprofloxacin hcl Hives 07/23/2011  . Iohexol  10/31/2010  . Prozac [fluoxetine hcl] Rash 05/11/2011  . Sulfonamide derivatives Hives and Rash     Family History  Problem Relation Age of Onset  . Pulmonary embolism Mother   . Heart attack Father   . Hypertension Father   .  Liver cancer Sister   . Cancer Sister   . Cancer Brother   . Cancer Daughter   . Colon cancer Neg Hx     Social History   Socioeconomic History  . Marital status: Divorced    Spouse name: Not on file  . Number of children: Not on file  . Years of education: Not on file  . Highest education level: Not on file  Occupational History  . Not on file  Social Needs  . Financial resource strain: Not on file  . Food insecurity:    Worry: Not on file    Inability: Not on file  . Transportation needs:    Medical: Not on file    Non-medical: Not on file  Tobacco Use  . Smoking status: Former Smoker    Packs/day: 0.50    Years: 20.00    Pack years: 10.00    Types: Cigarettes    Last attempt to quit: 12/21/2010    Years  since quitting: 7.1  . Smokeless tobacco: Never Used  . Tobacco comment: smoking cessation info given and reviewed   Substance and Sexual Activity  . Alcohol use: Yes    Alcohol/week: 0.0 oz    Comment: seldom  . Drug use: No  . Sexual activity: Yes    Birth control/protection: Surgical  Lifestyle  . Physical activity:    Days per week: Not on file    Minutes per session: Not on file  . Stress: Not on file  Relationships  . Social connections:    Talks on phone: Not on file    Gets together: Not on file    Attends religious service: Not on file    Active member of club or organization: Not on file    Attends meetings of clubs or organizations: Not on file    Relationship status: Not on file  . Intimate partner violence:    Fear of current or ex partner: Not on file    Emotionally abused: Not on file    Physically abused: Not on file    Forced sexual activity: Not on file  Other Topics Concern  . Not on file  Social History Narrative  . Not on file    Review of Systems: See HPI, otherwise negative ROS   Physical Exam: BP 124/67   Pulse 60   Temp 97.8 F (36.6 C) (Oral)   Resp 16   SpO2 97%  General:   Alert,  pleasant and cooperative in  NAD Head:  Normocephalic and atraumatic. Neck:  Supple; Lungs:  Clear throughout to auscultation.    Heart:  Regular rate and rhythm. Abdomen:  Soft, nontender and nondistended. Normal bowel sounds, without guarding, and without rebound.   Neurologic:  Alert and  oriented x4;  grossly normal neurologically.  Impression/Plan:     DYSPHAGIA/ANEMIA  PLAN:  GIVENS CAPSULE PLACEMENT/EGD/DIL TODAY. DISCUSSED PROCEDURE, BENEFITS, & RISKS: < 1% chance of medication reaction, PERFORATION, CAPSULE RETENTION REQUIRING SURGERY TO RETRIEVE IT, OR bleeding.

## 2018-02-26 ENCOUNTER — Ambulatory Visit (HOSPITAL_COMMUNITY): Payer: PPO | Admitting: Internal Medicine

## 2018-02-26 ENCOUNTER — Ambulatory Visit (HOSPITAL_COMMUNITY): Payer: PPO | Admitting: Adult Health

## 2018-02-27 ENCOUNTER — Encounter (HOSPITAL_COMMUNITY): Payer: Self-pay | Admitting: Gastroenterology

## 2018-02-28 ENCOUNTER — Encounter: Payer: Self-pay | Admitting: Family Medicine

## 2018-02-28 MED ORDER — LUBIPROSTONE 24 MCG PO CAPS: 24.0000 ug | ORAL_CAPSULE | Freq: Two times a day (BID) | ORAL | 3 refills | Status: DC

## 2018-03-05 ENCOUNTER — Other Ambulatory Visit: Payer: Self-pay

## 2018-03-05 ENCOUNTER — Other Ambulatory Visit (HOSPITAL_COMMUNITY)
Admission: RE | Admit: 2018-03-05 | Discharge: 2018-03-05 | Disposition: A | Discharge status: Home / Self Care | Payer: PPO | Source: Ambulatory Visit | Attending: Family Medicine | Admitting: Family Medicine

## 2018-03-05 ENCOUNTER — Encounter (HOSPITAL_COMMUNITY): Payer: Self-pay | Admitting: Internal Medicine

## 2018-03-05 ENCOUNTER — Inpatient Hospital Stay (HOSPITAL_BASED_OUTPATIENT_CLINIC_OR_DEPARTMENT_OTHER): Payer: PPO | Admitting: Internal Medicine

## 2018-03-05 VITALS — BP 107/62 | HR 62 | Temp 98.1°F | Resp 18 | Wt 117.2 lb

## 2018-03-05 DIAGNOSIS — R062 Wheezing: Secondary | ICD-10-CM | POA: Diagnosis not present

## 2018-03-05 DIAGNOSIS — D500 Iron deficiency anemia secondary to blood loss (chronic): Secondary | ICD-10-CM

## 2018-03-05 DIAGNOSIS — J449 Chronic obstructive pulmonary disease, unspecified: Secondary | ICD-10-CM | POA: Diagnosis not present

## 2018-03-05 DIAGNOSIS — M810 Age-related osteoporosis without current pathological fracture: Secondary | ICD-10-CM

## 2018-03-05 DIAGNOSIS — D509 Iron deficiency anemia, unspecified: Secondary | ICD-10-CM

## 2018-03-05 DIAGNOSIS — E871 Hypo-osmolality and hyponatremia: Secondary | ICD-10-CM | POA: Diagnosis not present

## 2018-03-05 DIAGNOSIS — C3492 Malignant neoplasm of unspecified part of left bronchus or lung: Secondary | ICD-10-CM

## 2018-03-05 DIAGNOSIS — Z77120 Contact with and (suspected) exposure to mold (toxic): Secondary | ICD-10-CM

## 2018-03-05 DIAGNOSIS — Z85118 Personal history of other malignant neoplasm of bronchus and lung: Secondary | ICD-10-CM

## 2018-03-05 LAB — CBC WITH DIFFERENTIAL/PLATELET
Basophils Absolute: 0 10*3/uL (ref 0.0–0.1)
Basophils Relative: 1 %
Eosinophils Absolute: 0.1 10*3/uL (ref 0.0–0.7)
Eosinophils Relative: 2 %
HCT: 41 % (ref 36.0–46.0)
Hemoglobin: 13.8 g/dL (ref 12.0–15.0)
Lymphocytes Relative: 21 %
Lymphs Abs: 1.3 10*3/uL (ref 0.7–4.0)
MCH: 29.9 pg (ref 26.0–34.0)
MCHC: 33.7 g/dL (ref 30.0–36.0)
MCV: 88.9 fL (ref 78.0–100.0)
Monocytes Absolute: 0.6 10*3/uL (ref 0.1–1.0)
Monocytes Relative: 10 %
Neutro Abs: 3.9 10*3/uL (ref 1.7–7.7)
Neutrophils Relative %: 66 %
Platelets: 297 10*3/uL (ref 150–400)
RBC: 4.61 MIL/uL (ref 3.87–5.11)
RDW: 14.7 % (ref 11.5–15.5)
WBC: 5.9 10*3/uL (ref 4.0–10.5)

## 2018-03-05 LAB — BASIC METABOLIC PANEL
Anion gap: 6 (ref 5–15)
BUN: 12 mg/dL (ref 6–20)
CO2: 30 mmol/L (ref 22–32)
Calcium: 8.9 mg/dL (ref 8.9–10.3)
Chloride: 93 mmol/L — ABNORMAL LOW (ref 101–111)
Creatinine, Ser: 0.47 mg/dL (ref 0.44–1.00)
GFR calc Af Amer: >60 mL/min (ref >60)
GFR calc non Af Amer: >60 mL/min (ref >60)
Glucose, Bld: 117 mg/dL — ABNORMAL HIGH (ref 65–99)
Potassium: 4.1 mmol/L (ref 3.5–5.1)
Sodium: 129 mmol/L — ABNORMAL LOW (ref 135–145)

## 2018-03-05 NOTE — Progress Notes (Signed)
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 Stage IB bronchoalveolar carcinoma,. Well-differentiated the left lower lobe status post resection on 12/21/2010. CT of chest done 05/09/2017 showed  1. Status post left lower lobe wedge resection. No findings to suggest local recurrence of disease or definite metastatic disease in the thorax. Previously noted ground-glass attenuation nodule in the superior segment of the right lower lobe appears slightly smaller than the prior study, suggesting a benign etiology. 2. Diffuse bronchial wall thickening with moderate to severe centrilobular and paraseptal emphysema; imaging findings suggestive of underlying COPD.  She is set up for CT of chest in August 2019 and will RTC to go over results.   2. Iron deficiency anemia.  Pt was last treated with IV iron in February 2019.  Labs done 03/05/2018 showed WBC 5.9 HB 13.8 and plts 297,000.  Ferritin level is 35.  She was given the option of repeat IV iron or continuation of oral iron.  She desires to continue oral iron.  She will have repeat labs in August 2019.  She has also been seen by GI and had upper EGD and Giv[place] Capsule study done on 02/24/2018 that showed distal esophageal webs that were dilated, mild gastritis and mild pyloric stenosis.  She also had a mild duodenal deformity noted.  Follow-up with Dr. Oneida Alar as directed.    3.  Osteoporosis.  This was noted on Dexa done 06/25/2017.  Will discuss option of Prolia if not  discussed by Dr. Dennard Schaumann, who was ordering physician.    4.  Hyponatremia.  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  Sodium 129.  Will repeat labs in 05/2018.  5.  Health maintenance.  Continue to follow-up with PCP, GI as directed.  Continue mammogram screenings as recommended.    Interval History:  77 year old female previously followed by Dr. Talbert Cage for Stage IB bronchoalveolar carcinoma,. Well-differentiated the left lower lobe status post resection on 12/21/2010. She had 14 lymph nodes negative and no perineural space invasion with clear margins and no LV I.   Current Status:  Pt is seen today for follow-up to go over labs.   She denies any blood in stool or urine.  She reports recent capsule study with Dr. Oneida Alar.      Adenocarcinoma of lung ([place])   12/21/2010 Initial Diagnosis    S/P left lower lobe resection for a well-differentiated Stage IB bronchoalveolar cancer. 0/14 lymph nodes.  No perineural invasion or LVI.  negative margins.      06/05/2016 Imaging    Although the previously described ground-glass attenuation nodule in the superior segment of the right lower lobe measures slightly larger on today's examination than prior studies, this is favored to be technique related. Today's study should serve as a baseline for future followup examinations. At this time, there is no central solid component to this lesion. No new nodules are noted. 2. Stable postoperative scarring in the left lower lobe and post infectious scarring throughout the visualize lung bases, as above. 3. Areas of cylindrical bronchiectasis throughout the lung bases bilaterally  05/09/2017 Imaging    IMPRESSION: 1. Status post left lower lobe wedge resection. No findings to suggest local recurrence of disease or definite metastatic disease in the thorax. Previously noted ground-glass attenuation nodule in the superior segment of the right lower lobe appears slightly smaller than the prior study, suggesting a benign  etiology. 2. Diffuse bronchial wall thickening with moderate to severe centrilobular and paraseptal emphysema; imaging findings suggestive of underlying COPD. 3. Aortic atherosclerosis, in addition to left main and left circumflex coronary artery disease. Assessment for potential risk factor modification, dietary therapy or pharmacologic therapy may be warranted, if clinically indicated. 4. Increasing anterior pericardial fluid and/or thickening. This is unlikely to be of hemodynamic significance at this time. No associated pericardial calcification. 5. New (but non acute) compression fracture of T6 with approximately 90% loss of anterior vertebral body height, in addition to multiple other chronic compression fractures of the visualize the thoracolumbar spine.        Problem List Patient Active Problem List   Diagnosis Date Noted  . Esophageal dysphagia [R13.10] 11/27/2017  . Heme positive stool [R19.5] 11/27/2017  . Iron deficiency anemia [D50.9] 12/05/2016  . SIADH (syndrome of inappropriate ADH production) ([place]) [E22.2]   . Polypharmacy [Z79.899] 09/14/2016  . Muscle weakness (generalized) [M62.81]   . Syncope [R55] 09/10/2016  . Rib pain on left side [R07.81]   . Hypersomnia [G47.10] 02/16/2016  . Acute bronchitis [J20.9] 02/16/2016  . Exertional dyspnea [R06.09] 12/20/2015  . HLD (hyperlipidemia) [E78.5] 11/23/2014  . Change in bowel habits [R19.4] 11/10/2014  . Abdominal pain, left lower quadrant [R10.32] 11/10/2014  . Emphysema lung ([place]) [J43.9] 11/08/2014  . Helicobacter pylori gastritis [K29.70, B96.81] 07/30/2014  . Acute on chronic respiratory failure with hypoxia ([place]) [J96.21] 04/10/2014  . Wrist fracture [S62.109A] 04/09/2014  . Occlusion and stenosis of carotid artery without mention of cerebral infarction [I65.29] 03/16/2014  . T12 burst fracture ([place]) [D66.440H] 01/13/2014  . Constipation [K59.00] 12/14/2013  . Acute respiratory failure with hypoxia  ([place]) [J96.01] 12/14/2013  . Hyponatremia [E87.1] 12/14/2013  . COPD (chronic obstructive pulmonary disease) ([place]) [J44.9] 12/14/2013  . Compression fracture of thoracic vertebra ([place]) [S22.000A] 12/14/2013  . Hypoxia [R09.02] 12/13/2013  . Compression fracture [IMO0002] 12/13/2013  . Diastolic dysfunction [K74.25]   . 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[place]) [I72.0] 08/11/2013  . Abdominal aneurysm without mention of rupture [I71.4] 02/26/2012  . Dyspepsia [R10.13] 02/11/2012  . Aneurysm ([place]) [I72.9] 02/11/2012  . Trigeminal neuralgia [G50.0]   . Epigastric pain [R10.13] 02/21/2011  . Dysphagia [R13.10] 02/21/2011  . Adenocarcinoma of lung ([place]) [C34.90] 12/07/2010  . HELICOBACTER PYLORI GASTRITIS, HX OF [Z87.19] 12/30/2009  . CONTRAST DYE ALLERGY [Z91.041] 12/30/2009    Past Medical History Past Medical History:  Diagnosis Date  . Adenocarcinoma of lung ([place]) 12/2010   left lung/surg only  . Allergic rhinitis   . 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 Anxiety disorder   . Aortic aneurysm ([place])   . Complication of anesthesia 2011   bloodpressure dropped during colonoscopy, not problems since  . Compression fracture   . COPD (chronic obstructive pulmonary disease) ([place])   . Depression   . Diastolic dysfunction   . Diverticulosis   . Dysphagia   . Emphysema lung ([place]) 11/08/2014  . Hiatal hernia   . Hypothyroidism   . IBS (irritable bowel syndrome)   . Iron deficiency anemia due to chronic blood loss 12/05/2016  . On home O2 12/15/2013   chronic  hypoxia  . PUD (peptic ulcer disease)    72yr  . Shortness of breath    with exertion  . SIADH (syndrome of inappropriate ADH production) ([place]   . Trigeminal neuralgia     Past Surgical History Past Surgical History:  Procedure Laterality Date  . ABDOMINAL HYSTERECTOMY    . BACK SURGERY  January 13, 2014  . CATARACT EXTRACTION    . CATARACT EXTRACTION W/PHACO  09/02/2012   Procedure: CATARACT EXTRACTION PHACO  AND INTRAOCULAR LENS PLACEMENT (IOC);  Surgeon: MElta GuadeloupeT. SGershon Crane MD;  Location: [facility];  Service: Ophthalmology;  Laterality: Left;  CDE:10.35  . CHOLECYSTECTOMY    . 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 COLONOSCOPY N/A 11/23/2014   Dr. FOneida Alar moderate diverticulosis, hemorrhoids, redundant colon. next TCS in 10-15 years.   .Marland KitchenENDARTERECTOMY Right 08/21/2013   Procedure: RIGHT CAROTID ANEURYSM RESECTION;  Surgeon: TRosetta Posner MD;  [place] [facility]  Service: Vascular;  Laterality: Right;  . ESOPHAGEAL DILATION  02/24/2018   Procedure: ESOPHAGEAL DILATION;  Surgeon: FDanie Binder MD;  [facility];  Service: Endoscopy;;  . ESOPHAGOGASTRODUODENOSCOPY  11/13/2009   w/dilation to 170m gastric ulceration (H.Pylori) s/p treatment  . ESOPHAGOGASTRODUODENOSCOPY  11/21/2009   distal esophageal web, gastritis  . ESOPHAGOGASTRODUODENOSCOPY  03/14/12   SLBRA:XENMMHWKGn the distal esophagus/Mild gastritis/small HH. + H.pylori, prescribed Pylera. Finished treatment.   . ESOPHAGOGASTRODUODENOSCOPY N/A 12/13/2017   Procedure: ESOPHAGOGASTRODUODENOSCOPY (EGD);  Surgeon: FiDanie BinderMD;  [facility];  Service: Endoscopy;  Laterality: N/A;  8:30am  . ESOPHAGOGASTRODUODENOSCOPY N/A 02/24/2018   Procedure: ESOPHAGOGASTRODUODENOSCOPY (EGD);  Surgeon: FiDanie BinderMD;  [facility];  Service: Endoscopy;  Laterality: N/A;  11:00am  . fatty tumor removal from lt groin    . FRACTURE SURGERY Left    hand and left ring finger  . GIVENS CAPSULE STUDY N/A 12/26/2017   Procedure: GIVENS CAPSULE STUDY;  Surgeon: FiDanie BinderMD;  [facility];  Service: Endoscopy;  Laterality: N/A;  7:30am  . GIVENS CAPSULE STUDY N/A 02/24/2018   Procedure: GIVENS CAPSULE STUDY;  Surgeon: FiDanie BinderMD;  [facility];  Service: Endoscopy;  Laterality: N/A;  . LUNG CANCER SURGERY  12/2010   Left VATS, minithoracotomy, LLL superior  segmentectomy  . ORIF WRIST FRACTURE Left 04/09/2014   Procedure: OPEN REDUCTION INTERNAL FIXATION (ORIF) WRIST FRACTURE;  Surgeon: TiRenette ButtersMD;  [place] [facility] Service: Orthopedics;  Laterality: Left;  . PERCUTANEOUS PINNING Left 04/09/2014   Procedure: PERCUTANEOUS PINNING EXTREMITY;  Surgeon: TiRenette ButtersMD;  [place] [facility] Service: Orthopedics;  Laterality: Left;  . SAVORY DILATION N/A 12/13/2017   Procedure: SAVORY DILATION;  Surgeon: FiDanie BinderMD;  [facility];  Service: Endoscopy;  Laterality: N/A;  . TUBAL LIGATION    . vocal cord surgery  02/06/2011   laryngoscopy with bilateral vocal cord Radiesse injection for vocal cord paralysis  . YAG LASER APPLICATION Left 1288/08/314 Procedure: YAG LASER APPLICATION;  Surgeon: MaRutherford GuysMD;  Location: [facility];  Service: Ophthalmology;  Laterality: Left;    Family History Family History  Problem Relation Age of Onset  . Pulmonary embolism Mother   . Heart attack Father   . Hypertension Father   . Liver cancer Sister   . Cancer Sister   . Cancer Brother   . Cancer Daughter   .  Colon cancer Neg Hx      Social History  reports that she quit smoking about 7 years ago. Her smoking use included cigarettes. She has a 10.00 pack-year smoking history. She has never used smokeless tobacco. She reports that she drinks alcohol. She reports that she does not use drugs.  Medications  Current Outpatient Medications:  .  acetaminophen (TYLENOL) 325 MG tablet, Take 650 mg by mouth every 6 (six) hours as needed for moderate pain or headache. , Disp: , Rfl:  .  ALPRAZolam (XANAX) 0.5 MG tablet, Take 1 tablet (0.5 mg total) by mouth 2 (two) times daily as needed for anxiety. (Patient taking differently: Take 0.5 mg by mouth at bedtime. ), Disp: 60 tablet, Rfl: 0 .  Calcium-Magnesium-Vitamin D (CITRACAL CALCIUM+D) 600-40-500 MG-MG-UNIT TB24, Take 1 tablet by mouth daily., Disp: , Rfl:  .  citalopram (CELEXA) 40 MG  tablet, Take 0.5 tablets (20 mg total) by mouth at bedtime., Disp: 90 tablet, Rfl: 4 .  levothyroxine (SYNTHROID, LEVOTHROID) 50 MCG tablet, Take 1 tablet (50 mcg total) by mouth daily before breakfast., Disp: 90 tablet, Rfl: 3 .  lubiprostone (AMITIZA) 24 MCG capsule, Take 1 capsule (24 mcg total) by mouth 2 (two) times daily with a meal., Disp: 180 capsule, Rfl: 3 .  magnesium oxide (MAG-OX) 400 MG tablet, Take 400 mg by mouth every evening. , Disp: , Rfl:  .  Misc Natural Products (SUPER GREENS PO), Take 1 Scoop by mouth every evening., Disp: , Rfl:  .  Multiple Vitamin (MULTIVITAMIN WITH MINERALS) TABS tablet, Take 1 tablet by mouth daily., Disp: , Rfl:  .  omeprazole (PRILOSEC) 20 MG capsule, 1 PO 30 MINS PRIOR TO BREAKFAST. (Patient taking differently: Take 20 mg by mouth daily. ), Disp: 90 capsule, Rfl: 3 .  ondansetron (ZOFRAN) 4 MG tablet, Take 1 tablet (4 mg total) by mouth 3 (three) times daily as needed for nausea or vomiting., Disp: 90 tablet, Rfl: 0 .  umeclidinium-vilanterol (ANORO ELLIPTA) 62.5-25 MCG/INH AEPB, Inhale 1 puff into the lungs daily., Disp: 90 each, Rfl: 3  Allergies Ciprofloxacin hcl; Iohexol; Prozac [fluoxetine hcl]; and Sulfonamide derivatives  Review of Systems Review of Systems - Oncology ROS as per HPI otherwise 12 point ROS is negative.   Physical Exam  Vitals Wt Readings from Last 3 Encounters:  03/05/18 117 lb 3.2 oz (53.2 kg)  02/12/18 114 lb (51.7 kg)  01/07/18 116 lb (52.6 kg)   Temp Readings from Last 3 Encounters:  03/05/18 98.1 F (36.7 C) (Oral)  02/24/18 97.8 F (36.6 C) (Oral)  01/07/18 97.6 F (36.4 C) (Oral)   BP Readings from Last 3 Encounters:  03/05/18 107/62  02/24/18 (!) 91/55  02/12/18 118/70   Pulse Readings from Last 3 Encounters:  03/05/18 62  02/24/18 65  02/12/18 67    Constitutional: Well-developed, well-nourished, and in no distress.   HENT: Head: Normocephalic and atraumatic.  Mouth/Throat: No oropharyngeal  exudate. Mucosa moist. Eyes: Pupils are equal, round, and reactive to light. Conjunctivae are normal. No scleral icterus.  Neck: Normal range of motion. Neck supple. No JVD present.  Cardiovascular: Normal rate, regular rhythm and normal heart sounds.  Exam reveals no gallop and no friction rub.   No murmur heard. Pulmonary/Chest: Effort normal and breath sounds normal. No respiratory distress. No wheezes.No rales.  Abdominal: Soft. Bowel sounds are normal. No distension. There is no tenderness. There is no guarding.  Musculoskeletal: No edema or tenderness.  Lymphadenopathy: No cervical, axillary or  supraclavicular adenopathy.  Neurological: Alert and oriented to person, place, and time. No cranial nerve deficit.  Skin: Skin is warm and dry. No rash noted. No erythema. No pallor.  Psychiatric: Affect and judgment normal.   [facility] Outpatient Visit on 03/05/2018  Component Date Value Ref Range Status  . WBC 03/05/2018 5.9  4.0 - 10.5 K/uL Final  . RBC 03/05/2018 4.61  3.87 - 5.11 MIL/uL Final  . Hemoglobin 03/05/2018 13.8  12.0 - 15.0 g/dL Final  . HCT 03/05/2018 41.0  36.0 - 46.0 % Final  . MCV 03/05/2018 88.9  78.0 - 100.0 fL Final  . MCH 03/05/2018 29.9  26.0 - 34.0 pg Final  . MCHC 03/05/2018 33.7  30.0 - 36.0 g/dL Final  . RDW 03/05/2018 14.7  11.5 - 15.5 % Final  . Platelets 03/05/2018 297  150 - 400 K/uL Final  . Neutrophils Relative % 03/05/2018 66  % Final  . Neutro Abs 03/05/2018 3.9  1.7 - 7.7 K/uL Final  . Lymphocytes Relative 03/05/2018 21  % Final  . Lymphs Abs 03/05/2018 1.3  0.7 - 4.0 K/uL Final  . Monocytes Relative 03/05/2018 10  % Final  . Monocytes Absolute 03/05/2018 0.6  0.1 - 1.0 K/uL Final  . Eosinophils Relative 03/05/2018 2  % Final  . Eosinophils Absolute 03/05/2018 0.1  0.0 - 0.7 K/uL Final  . Basophils Relative 03/05/2018 1  % Final  . Basophils Absolute 03/05/2018 0.0  0.0 - 0.1 K/uL Final   Performed at [facility], [address]  [place], [place] [address]  . Sodium 03/05/2018 129* 135 - 145 mmol/L Final  . Potassium 03/05/2018 4.1  3.5 - 5.1 mmol/L Final  . Chloride 03/05/2018 93* 101 - 111 mmol/L Final  . CO2 03/05/2018 30  22 - 32 mmol/L Final  . Glucose, Bld 03/05/2018 117* 65 - 99 mg/dL Final  . BUN 03/05/2018 12  6 - 20 mg/dL Final  . Creatinine, Ser 03/05/2018 0.47  0.44 - 1.00 mg/dL Final  . Calcium 03/05/2018 8.9  8.9 - 10.3 mg/dL Final  . GFR calc non Af Amer 03/05/2018 >60  >60 mL/min Final  . GFR calc Af Amer 03/05/2018 >60  >60 mL/min Final   Comment: (NOTE) The eGFR has been calculated using the CKD EPI equation. This calculation has not been validated in all clinical situations. eGFR's persistently <60 mL/min signify possible Chronic Kidney Disease.   Georgiann Hahn gap 03/05/2018 6  5 - 15 Final   Performed at [facility], [address], [place], [place] [address]     Pathology Orders Placed This Encounter  Procedures  . CT Chest Wo Contrast    Standing Status:   Future    Standing Expiration Date:   03/06/2019    Order Specific Question:   Preferred imaging location?    Answer:   [facility]    Order Specific Question:   Radiology Contrast Protocol - do NOT remove file path    Answer:   [email or web address]  . CBC with Differential/Platelet    Standing Status:   Future    Standing Expiration Date:   03/06/2019  . Comprehensive metabolic panel    Standing Status:   Future    Standing Expiration Date:   03/06/2019  . Lactate dehydrogenase    Standing Status:   Future    Standing Expiration Date:   03/06/2019  . Ferritin    Standing Status:   Future    Standing Expiration Date:  03/06/2019       Zoila Shutter MD

## 2018-03-05 NOTE — Patient Instructions (Signed)
[facility] at [facility] Discharge Instructions  Today you saw Dr. Higgs.    Thank you for choosing [facility] at [facility] to provide your oncology and hematology care.  To afford each patient quality time with our provider, please arrive at least 15 minutes before your scheduled appointment time.   If you have a lab appointment with the Cancer Center please come in thru the  Main Entrance and check in at the main information desk  You need to re-schedule your appointment should you arrive 10 or more minutes late.  We strive to give you quality time with our providers, and arriving late affects you and other patients whose appointments are after yours.  Also, if you no show three or more times for appointments you may be dismissed from the clinic at the providers discretion.     Again, thank you for choosing [facility].  Our hope is that these requests will decrease the amount of time that you wait before being seen by our physicians.       _____________________________________________________________  Should you have questions after your visit to [facility], please contact our office at [phone] between the hours of 8:30 a.m. and 4:30 p.m.  Voicemails left after 4:30 p.m. will not be returned until the following business day.  For prescription refill requests, have your pharmacy contact our office.       Resources For Cancer Patients and their Caregivers ? American Cancer Society: Can assist with transportation, wigs, general needs, runs Look Good Feel Better.        1-888-227-6333 ? Cancer Care: Provides financial assistance, online support groups, medication/co-pay assistance.  1-800-813-HOPE (4673) ? [facility] Assists [place] cancer patients and their families through emotional , educational and financial support.  336-427-4357 ? [place] DSS Where to apply for  food stamps, Medicaid and utility assistance. 336-342-1394 ? [facility]: Transportation to medical appointments. 336-347-2287 ? Social Security Administration: May apply for disability if have a Stage IV cancer. 336-342-7796 1-800-772-1213 ? [place] Aging, Disability and Transit Services: Assists with nutrition, care and transit needs. 336-349-2343  Cancer Center Support Programs:   > Cancer Support Group  2nd Tuesday of the month 1pm-2pm, Journey Room   > Creative Journey  3rd Tuesday of the month 1130am-1pm, Journey Room    

## 2018-03-06 ENCOUNTER — Other Ambulatory Visit: Payer: Self-pay

## 2018-03-06 ENCOUNTER — Emergency Department (HOSPITAL_COMMUNITY): Payer: PPO

## 2018-03-06 ENCOUNTER — Encounter (INDEPENDENT_AMBULATORY_CARE_PROVIDER_SITE_OTHER): Payer: Self-pay

## 2018-03-06 ENCOUNTER — Encounter (HOSPITAL_COMMUNITY): Payer: Self-pay | Admitting: Emergency Medicine

## 2018-03-06 ENCOUNTER — Other Ambulatory Visit: Payer: Self-pay | Admitting: Family Medicine

## 2018-03-06 ENCOUNTER — Emergency Department (HOSPITAL_COMMUNITY)
Admission: EM | Admit: 2018-03-06 | Discharge: 2018-03-06 | Disposition: A | Discharge status: Home / Self Care | Payer: PPO | Attending: Emergency Medicine | Admitting: Emergency Medicine

## 2018-03-06 DIAGNOSIS — Y999 Unspecified external cause status: Secondary | ICD-10-CM | POA: Insufficient documentation

## 2018-03-06 DIAGNOSIS — S82001A Unspecified fracture of right patella, initial encounter for closed fracture: Secondary | ICD-10-CM | POA: Insufficient documentation

## 2018-03-06 DIAGNOSIS — S82034A Nondisplaced transverse fracture of right patella, initial encounter for closed fracture: Secondary | ICD-10-CM | POA: Diagnosis not present

## 2018-03-06 DIAGNOSIS — S0031XA Abrasion of nose, initial encounter: Secondary | ICD-10-CM | POA: Diagnosis not present

## 2018-03-06 DIAGNOSIS — Z79899 Other long term (current) drug therapy: Secondary | ICD-10-CM | POA: Insufficient documentation

## 2018-03-06 DIAGNOSIS — S0992XA Unspecified injury of nose, initial encounter: Secondary | ICD-10-CM | POA: Insufficient documentation

## 2018-03-06 DIAGNOSIS — Y939 Activity, unspecified: Secondary | ICD-10-CM | POA: Insufficient documentation

## 2018-03-06 DIAGNOSIS — Z85118 Personal history of other malignant neoplasm of bronchus and lung: Secondary | ICD-10-CM | POA: Insufficient documentation

## 2018-03-06 DIAGNOSIS — E039 Hypothyroidism, unspecified: Secondary | ICD-10-CM | POA: Insufficient documentation

## 2018-03-06 DIAGNOSIS — S8991XA Unspecified injury of right lower leg, initial encounter: Secondary | ICD-10-CM | POA: Diagnosis present

## 2018-03-06 DIAGNOSIS — E871 Hypo-osmolality and hyponatremia: Secondary | ICD-10-CM

## 2018-03-06 DIAGNOSIS — Z87891 Personal history of nicotine dependence: Secondary | ICD-10-CM | POA: Diagnosis not present

## 2018-03-06 DIAGNOSIS — Y929 Unspecified place or not applicable: Secondary | ICD-10-CM | POA: Insufficient documentation

## 2018-03-06 DIAGNOSIS — M25561 Pain in right knee: Secondary | ICD-10-CM | POA: Diagnosis not present

## 2018-03-06 DIAGNOSIS — J449 Chronic obstructive pulmonary disease, unspecified: Secondary | ICD-10-CM | POA: Diagnosis not present

## 2018-03-06 DIAGNOSIS — W06XXXA Fall from bed, initial encounter: Secondary | ICD-10-CM | POA: Diagnosis not present

## 2018-03-06 MED ORDER — OXYCODONE-ACETAMINOPHEN 5-325 MG PO TABS
2.0000 | ORAL_TABLET | Freq: Once | ORAL | Status: AC
  Administered 2018-03-06: 2 via ORAL
  Filled 2018-03-06: qty 2

## 2018-03-06 MED ORDER — HYDROCODONE-ACETAMINOPHEN 5-325 MG PO TABS: 1.0000 | ORAL_TABLET | Freq: Four times a day (QID) | ORAL | 0 refills | Status: DC | PRN

## 2018-03-06 NOTE — Discharge Instructions (Signed)
Keep your leg elevated as much as possible.  Ice for 20 minutes every 2 hours while awake for the next 2 days.  Hydrocodone is prescribed as needed for pain.  Follow-up with Dr. Aline Brochure in the orthopedic clinic in the next 3 days.  His contact information has been provided in this discharge summary for you to call and make these arrangements.

## 2018-03-06 NOTE — ED Triage Notes (Signed)
Pt states she rolled out of bed while asleep and c/o right knee pain and nose pain with bleeding at times.

## 2018-03-06 NOTE — ED Provider Notes (Signed)
[facility] EMERGENCY DEPARTMENT Provider Note   CSN: [id number] Arrival date & time: 03/06/18  0010     History   Chief Complaint Chief Complaint  Patient presents with  . Fall    HPI [name] is a 77 y.o. female.  Patient is a 77 year old female with history of COPD, aortic aneurysm, lung cancer.  She presents for evaluation after a fall.  She reports falling out of bed this evening and injuring her right knee.  She also reports striking her nose on the bed when she fell.  She denies any loss of consciousness, neck pain, or other injury.  The history is provided by the patient.  Fall  This is a new problem. The current episode started less than 1 hour ago. The problem occurs constantly. The problem has not changed since onset.Pertinent negatives include no chest pain and no abdominal pain. The symptoms are aggravated by walking. Nothing relieves the symptoms.    Past Medical History:  Diagnosis Date  . Adenocarcinoma of lung ([place]) 12/2010   left lung/surg only  . Allergic rhinitis   . 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 Anxiety disorder   . Aortic aneurysm ([place])   . Complication of anesthesia 2011   bloodpressure dropped during colonoscopy, not problems since  . Compression fracture   . COPD (chronic obstructive pulmonary disease) ([place])   . Depression   . Diastolic dysfunction   . Diverticulosis   . Dysphagia   . Emphysema lung ([place]) 11/08/2014  . Hiatal hernia   . Hypothyroidism   . IBS (irritable bowel syndrome)   . Iron deficiency anemia due to chronic blood loss 12/05/2016  . On home O2 12/15/2013   chronic hypoxia  . PUD (peptic ulcer disease)    72yrs  . Shortness of breath    with exertion  . SIADH (syndrome of inappropriate ADH production) ([place])   . Trigeminal neuralgia     Patient Active Problem List   Diagnosis Date Noted  . Esophageal dysphagia 11/27/2017  . Heme positive stool 11/27/2017  . Iron deficiency  anemia 12/05/2016  . SIADH (syndrome of inappropriate ADH production) ([place])   . Polypharmacy 09/14/2016  . Muscle weakness (generalized)   . Syncope 09/10/2016  . Rib pain on left side   . Hypersomnia 02/16/2016  . Acute bronchitis 02/16/2016  . Exertional dyspnea 12/20/2015  . HLD (hyperlipidemia) 11/23/2014  . Change in bowel habits 11/10/2014  . Abdominal pain, left lower quadrant 11/10/2014  . Emphysema lung ([place]) 11/08/2014  . Helicobacter pylori gastritis 07/30/2014  . Acute on chronic respiratory failure with hypoxia ([place]) 04/10/2014  . Wrist fracture 04/09/2014  . 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 T12 burst fracture ([place]) 01/13/2014  . Constipation 12/14/2013  . Acute respiratory failure with hypoxia ([place]) 12/14/2013  . Hyponatremia 12/14/2013  . COPD (chronic obstructive pulmonary disease) ([place]) 12/14/2013  . Compression fracture of thoracic vertebra ([place]) 12/14/2013  . Hypoxia 12/13/2013  . Compression fracture 12/13/2013  . Diastolic dysfunction   . 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 Dyspepsia 02/11/2012  . Aneurysm ([place]) 02/11/2012  . Trigeminal neuralgia   . Epigastric pain 02/21/2011  . Dysphagia 02/21/2011  . Adenocarcinoma of lung ([place]) 12/07/2010  . HELICOBACTER PYLORI GASTRITIS, HX OF 12/30/2009  . CONTRAST DYE ALLERGY 12/30/2009    Past Surgical History:  Procedure  Laterality Date  . ABDOMINAL HYSTERECTOMY    . BACK SURGERY  January 13, 2014  . CATARACT EXTRACTION    . CATARACT EXTRACTION W/PHACO  09/02/2012   Procedure: CATARACT EXTRACTION PHACO AND INTRAOCULAR LENS PLACEMENT (IOC);  Surgeon: Elta Guadeloupe T. Gershon Crane, MD;  Location: [facility];  Service: Ophthalmology;  Laterality: Left;  CDE:10.35  . CHOLECYSTECTOMY    . 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 COLONOSCOPY N/A 11/23/2014   Dr. Oneida Alar:  moderate diverticulosis, hemorrhoids, redundant colon. next TCS in 10-15 years.   Marland Kitchen ENDARTERECTOMY Right 08/21/2013   Procedure: RIGHT CAROTID ANEURYSM RESECTION;  Surgeon: Rosetta Posner, MD;  Location: [place];  Service: Vascular;  Laterality: Right;  . ESOPHAGEAL DILATION  02/24/2018   Procedure: ESOPHAGEAL DILATION;  Surgeon: Danie Binder, MD;  [facility];  Service: Endoscopy;;  . ESOPHAGOGASTRODUODENOSCOPY  11/13/2009   w/dilation to 47mm, gastric ulceration (H.Pylori) s/p treatment  . ESOPHAGOGASTRODUODENOSCOPY  11/21/2009   distal esophageal web, gastritis  . ESOPHAGOGASTRODUODENOSCOPY  03/14/12   FOY:DXAJOINOM in the distal esophagus/Mild gastritis/small HH. + H.pylori, prescribed Pylera. Finished treatment.   . ESOPHAGOGASTRODUODENOSCOPY N/A 12/13/2017   Procedure: ESOPHAGOGASTRODUODENOSCOPY (EGD);  Surgeon: Danie Binder, MD;  [facility];  Service: Endoscopy;  Laterality: N/A;  8:30am  . ESOPHAGOGASTRODUODENOSCOPY N/A 02/24/2018   Procedure: ESOPHAGOGASTRODUODENOSCOPY (EGD);  Surgeon: Danie Binder, MD;  [facility];  Service: Endoscopy;  Laterality: N/A;  11:00am  . fatty tumor removal from lt groin    . FRACTURE SURGERY Left    hand and left ring finger  . GIVENS CAPSULE STUDY N/A 12/26/2017   Procedure: GIVENS CAPSULE STUDY;  Surgeon: Danie Binder, MD;  [facility];  Service: Endoscopy;  Laterality: N/A;  7:30am  . GIVENS CAPSULE STUDY N/A 02/24/2018   Procedure: GIVENS CAPSULE STUDY;  Surgeon: Danie Binder, MD;  [facility];  Service: Endoscopy;  Laterality: N/A;  . LUNG CANCER SURGERY  12/2010   Left VATS, minithoracotomy, LLL superior segmentectomy  . ORIF WRIST FRACTURE Left 04/09/2014   Procedure: OPEN REDUCTION INTERNAL FIXATION (ORIF) WRIST FRACTURE;  Surgeon: Renette Butters, MD;  Location: [place];  Service: Orthopedics;  Laterality: Left;  . PERCUTANEOUS PINNING Left 04/09/2014   Procedure: PERCUTANEOUS PINNING  EXTREMITY;  Surgeon: Renette Butters, MD;  Location: [place];  Service: Orthopedics;  Laterality: Left;  . SAVORY DILATION N/A 12/13/2017   Procedure: SAVORY DILATION;  Surgeon: Danie Binder, MD;  [facility];  Service: Endoscopy;  Laterality: N/A;  . TUBAL LIGATION    . vocal cord surgery  02/06/2011   laryngoscopy with bilateral vocal cord Radiesse injection for vocal cord paralysis  . YAG LASER APPLICATION Left 76/72/0947   Procedure: YAG LASER APPLICATION;  Surgeon: Rutherford Guys, MD;  [facility];  Service: Ophthalmology;  Laterality: Left;     OB History    Gravida  2   Para  2   Term  2   Preterm      AB      Living  2     SAB      TAB      Ectopic      Multiple      Live Births               Home Medications    Prior to Admission medications   Medication Sig Start Date End Date  Taking? Authorizing Provider  acetaminophen (TYLENOL) 325 MG tablet Take 650 mg by mouth every 6 (six) hours as needed for moderate pain or headache.     [provider]  ALPRAZolam Duanne Moron) 0.5 MG tablet Take 1 tablet (0.5 mg total) by mouth 2 (two) times daily as needed for anxiety. Patient taking differently: Take 0.5 mg by mouth at bedtime.  01/17/18   [name], MD  Calcium-Magnesium-Vitamin D (CITRACAL CALCIUM+D) 600-40-500 MG-MG-UNIT TB24 Take 1 tablet by mouth daily.    [provider]  citalopram (CELEXA) 40 MG tablet Take 0.5 tablets (20 mg total) by mouth at bedtime. 01/17/18   [name], MD  levothyroxine (SYNTHROID, LEVOTHROID) 50 MCG tablet Take 1 tablet (50 mcg total) by mouth daily before breakfast. 01/17/18   [name], MD  lubiprostone (AMITIZA) 24 MCG capsule Take 1 capsule (24 mcg total) by mouth 2 (two) times daily with a meal. 02/28/18   [name], MD  magnesium oxide (MAG-OX) 400 MG tablet Take 400 mg by mouth every evening.     [provider]  Misc Natural Products (SUPER GREENS PO) Take 1  Scoop by mouth every evening.    [provider]  Multiple Vitamin (MULTIVITAMIN WITH MINERALS) TABS tablet Take 1 tablet by mouth daily.    [provider]  omeprazole (PRILOSEC) 20 MG capsule 1 PO 30 MINS PRIOR TO BREAKFAST. Patient taking differently: Take 20 mg by mouth daily.  12/13/17   Fields, Marga Melnick, MD  ondansetron (ZOFRAN) 4 MG tablet Take 1 tablet (4 mg total) by mouth 3 (three) times daily as needed for nausea or vomiting. 11/14/16   [name], MD  umeclidinium-vilanterol (ANORO ELLIPTA) 62.5-25 MCG/INH AEPB Inhale 1 puff into the lungs daily. 04/25/17   [name], MD    Family History Family History  Problem Relation Age of Onset  . Pulmonary embolism Mother   . Heart attack Father   . Hypertension Father   . Liver cancer Sister   . Cancer Sister   . Cancer Brother   . Cancer Daughter   . Colon cancer Neg Hx     Social History Social History   Tobacco Use  . Smoking status: Former Smoker    Packs/day: 0.50    Years: 20.00    Pack years: 10.00    Types: Cigarettes    Last attempt to quit: 12/21/2010    Years since quitting: 7.2  . Smokeless tobacco: Never Used  . Tobacco comment: smoking cessation info given and reviewed   Substance Use Topics  . Alcohol use: Yes    Alcohol/week: 0.0 oz    Comment: seldom  . Drug use: No     Allergies   Ciprofloxacin hcl; Iohexol; Prozac [fluoxetine hcl]; and Sulfonamide derivatives   Review of Systems Review of Systems  Cardiovascular: Negative for chest pain.  Gastrointestinal: Negative for abdominal pain.  All other systems reviewed and are negative.    Physical Exam Updated Vital Signs BP 114/72   Pulse 85   Temp 98.4 F (36.9 C) (Oral)   Resp 18   Ht 5\' 5"  (1.651 m)   Wt 53.1 kg (117 lb)   SpO2 95%   BMI 19.47 kg/m   Physical Exam  Constitutional: She is oriented to person, place, and time. She appears well-developed and well-nourished. No distress.  HENT:  Head:  Normocephalic and atraumatic.  There are abrasions and mild swelling to the bridge of the nose,  however no significant deformity.  There is no septal hematoma.  Neck: Normal range of motion. Neck supple.  Cardiovascular: Normal rate and regular rhythm. Exam reveals no gallop and no friction rub.  No murmur heard. Pulmonary/Chest: Effort normal and breath sounds normal. No respiratory distress. She has no wheezes.  Abdominal: Soft. Bowel sounds are normal. She exhibits no distension. There is no tenderness.  Musculoskeletal: Normal range of motion.  There is swelling overlying the patella of the right knee.  There is significant discomfort with any range of motion which limits the exam.  Distal PMS is intact.  Neurological: She is alert and oriented to person, place, and time.  Skin: Skin is warm and dry. She is not diaphoretic.  Nursing note and vitals reviewed.    ED Treatments / Results  Labs (all labs ordered are listed, but only abnormal results are displayed) Labs Reviewed - No data to display  EKG None  Radiology Dg Nasal Bones  Result Date: 03/06/2018 CLINICAL DATA:  77 y/o F; fall from bed landing face first. Distant history of fracture. EXAM: NASAL BONES - 3+ VIEW COMPARISON:  None. FINDINGS: There is angulation of the nasal bones without displacement, probably representing chronic fracture deformity. No fluid level within the paranasal sinuses or mastoid air cells identified. IMPRESSION: Mild angulation nasal bones without displacement, probably representing chronic fracture deformity. Electronically Signed   By: Kristine Garbe M.D.   On: 03/06/2018 04:18   Dg Knee Complete 4 Views Right  Result Date: 03/06/2018 CLINICAL DATA:  Right knee pain/injury EXAM: RIGHT KNEE - COMPLETE 4+ VIEW COMPARISON:  None. FINDINGS: Mildly comminuted, distracted transverse patellar fracture, best visualized on the lateral view. Associated prepatellar soft tissue swelling. IMPRESSION:  Transverse patellar fracture. Electronically Signed   By: Julian Hy M.D.   On: 03/06/2018 01:37    Procedures Procedures (including critical care time)  Medications Ordered in ED Medications  oxyCODONE-acetaminophen (PERCOCET/ROXICET) 5-325 MG per tablet 2 tablet (2 tablets Oral Given 03/06/18 0415)     Initial Impression / Assessment and Plan / ED Course  I have reviewed the triage vital signs and the nursing notes.  Pertinent labs & imaging results that were available during my care of the patient were reviewed by me and considered in my medical decision making (see chart for details).  X-rays reveal a transverse fracture of the right patella.  She will be placed in a knee immobilizer and is to follow-up with orthopedics.  I had a long discussion with the patient regarding the disposition.  She lives by herself and believes she can make due at home.  She was offered home health referral but has declined.    She will be placed in a knee immobilizer and advised to follow-up with orthopedics in the next 3 to 4 days.  Final Clinical Impressions(s) / ED Diagnoses   Final diagnoses:  None    ED Discharge Orders    None       Veryl Speak, MD 03/06/18 [phone]

## 2018-03-06 NOTE — ED Notes (Signed)
Pt in x-ray at this time

## 2018-03-07 ENCOUNTER — Encounter: Payer: Self-pay | Admitting: Family Medicine

## 2018-03-07 ENCOUNTER — Telehealth: Payer: Self-pay | Admitting: Orthopedic Surgery

## 2018-03-07 NOTE — Telephone Encounter (Signed)
Patient called today at 11:57 stating she needed to schedule an appointment.  She went to ED yesterday and she has Transverse patellar fracture.  Would you review your schedule and advise when you could see her?  Thanks

## 2018-03-07 NOTE — Telephone Encounter (Signed)
Give appt tues 930

## 2018-03-08 LAB — ASPERGILLUS ANTIBODY BY IMMUNODIFF
Aspergillus flavus: NEGATIVE
Aspergillus fumigatus, IgG: NEGATIVE
Aspergillus niger: NEGATIVE

## 2018-03-11 ENCOUNTER — Telehealth: Payer: Self-pay | Admitting: General Practice

## 2018-03-11 ENCOUNTER — Telehealth: Payer: Self-pay | Admitting: Gastroenterology

## 2018-03-11 ENCOUNTER — Ambulatory Visit: Payer: PPO | Admitting: Orthopedic Surgery

## 2018-03-11 ENCOUNTER — Encounter: Payer: Self-pay | Admitting: Orthopedic Surgery

## 2018-03-11 VITALS — BP 129/72 | HR 82 | Ht 65.0 in

## 2018-03-11 DIAGNOSIS — S82041A Displaced comminuted fracture of right patella, initial encounter for closed fracture: Secondary | ICD-10-CM

## 2018-03-11 DIAGNOSIS — D649 Anemia, unspecified: Secondary | ICD-10-CM

## 2018-03-11 NOTE — Progress Notes (Signed)
NEW PATIENT OFFICE VISIT   Chief Complaint  Patient presents with  . Knee Pain    right patella fracture date of injury 03/05/18     MEDICAL DECISION SECTION  xrays ordered? NO  My independent reading of xrays:  4 view of the right knee  I see a transverse comminuted patella fracture slight displacement.   Encounter Diagnosis  Name Primary?  . Closed displaced comminuted fracture of right patella, initial encounter, DOI 03/05/2018 Yes     PLAN:  Open treatment internal fixation right patella  The procedure has been fully reviewed with the patient; The risks and benefits of surgery have been discussed and explained and understood. Alternative treatment has also been reviewed, questions were encouraged and answered. The postoperative plan is also been reviewed.   I have advised the patient that she is at high risk for complications related to the surgery based on her medical history as well as her poor bone density she is on calcium and vitamin D.   Chief Complaint  Patient presents with  . Knee Pain    right patella fracture date of injury 03/05/18    77 year old female with a history of lung cancer several fractures currently ambulates with a  walker lives alone and is on home oxygen since 2016, history of emphysema fell out of bed on 29 May injured her right patella x-rays show a, transverse comminuted displaced fracture she has demonstrated loss of integrity of the extensor mechanism presents with pain over the patella some swelling of the right ankle with ecchymosis as well as tenderness over the right patella with loss of integrity of her ability to stand on the right leg.  HAD ECHO: 5/19 EF 60%   Review of Systems  Respiratory: Positive for shortness of breath.   Cardiovascular: Negative.   Musculoskeletal: Positive for back pain and joint pain.  All other systems reviewed and are negative.    Past Medical History:  Diagnosis Date  . Adenocarcinoma of lung  ([place]) 12/2010   left lung/surg only  . Allergic rhinitis   . 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 Anxiety disorder   . Aortic aneurysm ([place])   . Complication of anesthesia 2011   bloodpressure dropped during colonoscopy, not problems since  . Compression fracture   . COPD (chronic obstructive pulmonary disease) ([place])   . Depression   . Diastolic dysfunction   . Diverticulosis   . Dysphagia   . Emphysema lung ([place]) 11/08/2014  . Hiatal hernia   . Hypothyroidism   . IBS (irritable bowel syndrome)   . Iron deficiency anemia due to chronic blood loss 12/05/2016  . On home O2 12/15/2013   chronic hypoxia  . PUD (peptic ulcer disease)    24yrs  . Shortness of breath    with exertion  . SIADH (syndrome of inappropriate ADH production) ([place])   . Trigeminal neuralgia     Past Surgical History:  Procedure Laterality Date  . ABDOMINAL HYSTERECTOMY    . BACK SURGERY  January 13, 2014  . CATARACT EXTRACTION    . CATARACT EXTRACTION W/PHACO  09/02/2012   Procedure: CATARACT EXTRACTION PHACO AND INTRAOCULAR LENS PLACEMENT (IOC);  Surgeon: Elta Guadeloupe T. Gershon Crane, MD;  Location[facility];  Service: Ophthalmology;  Laterality: Left;  CDE:10.35  . CHOLECYSTECTOMY    . 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 COLONOSCOPY N/A 11/23/2014   Dr. Oneida Alar: moderate diverticulosis,  hemorrhoids, redundant colon. next TCS in 10-15 years.   Marland Kitchen ENDARTERECTOMY Right 08/21/2013   Procedure: RIGHT CAROTID ANEURYSM RESECTION;  Surgeon: Rosetta Posner, MD;  Location: [place];  Service: Vascular;  Laterality: Right;  . ESOPHAGEAL DILATION  02/24/2018   Procedure: ESOPHAGEAL DILATION;  Surgeon: Danie Binder, MD;  [facility];  Service: Endoscopy;;  . ESOPHAGOGASTRODUODENOSCOPY  11/13/2009   w/dilation to 56mm, gastric ulceration (H.Pylori) s/p treatment  . ESOPHAGOGASTRODUODENOSCOPY  11/21/2009   distal esophageal web, gastritis  .  ESOPHAGOGASTRODUODENOSCOPY  03/14/12   QTM:[id number] in the distal esophagus/Mild gastritis/small HH. + H.pylori, prescribed Pylera. Finished treatment.   . ESOPHAGOGASTRODUODENOSCOPY N/A 12/13/2017   Procedure: ESOPHAGOGASTRODUODENOSCOPY (EGD);  Surgeon: Danie Binder, MD;  [facility];  Service: Endoscopy;  Laterality: N/A;  8:30am  . ESOPHAGOGASTRODUODENOSCOPY N/A 02/24/2018   Procedure: ESOPHAGOGASTRODUODENOSCOPY (EGD);  Surgeon: Danie Binder, MD;  [facility];  Service: Endoscopy;  Laterality: N/A;  11:00am  . fatty tumor removal from lt groin    . FRACTURE SURGERY Left    hand and left ring finger  . GIVENS CAPSULE STUDY N/A 12/26/2017   Procedure: GIVENS CAPSULE STUDY;  Surgeon: Danie Binder, MD;  [facility];  Service: Endoscopy;  Laterality: N/A;  7:30am  . GIVENS CAPSULE STUDY N/A 02/24/2018   Procedure: GIVENS CAPSULE STUDY;  Surgeon: Danie Binder, MD;  [facility];  Service: Endoscopy;  Laterality: N/A;  . LUNG CANCER SURGERY  12/2010   Left VATS, minithoracotomy, LLL superior segmentectomy  . ORIF WRIST FRACTURE Left 04/09/2014   Procedure: OPEN REDUCTION INTERNAL FIXATION (ORIF) WRIST FRACTURE;  Surgeon: Renette Butters, MD;  Location: [place];  Service: Orthopedics;  Laterality: Left;  . PERCUTANEOUS PINNING Left 04/09/2014   Procedure: PERCUTANEOUS PINNING EXTREMITY;  Surgeon: Renette Butters, MD;  Location: [place];  Service: Orthopedics;  Laterality: Left;  . SAVORY DILATION N/A 12/13/2017   Procedure: SAVORY DILATION;  Surgeon: Danie Binder, MD;  [facility];  Service: Endoscopy;  Laterality: N/A;  . TUBAL LIGATION    . vocal cord surgery  02/06/2011   laryngoscopy with bilateral vocal cord Radiesse injection for vocal cord paralysis  . YAG LASER APPLICATION Left 62/56/3893   Procedure: YAG LASER APPLICATION;  Surgeon: Rutherford Guys, MD;  [facility];  Service: Ophthalmology;  Laterality: Left;    Family  History  Problem Relation Age of Onset  . Pulmonary embolism Mother   . Heart attack Father   . Hypertension Father   . Liver cancer Sister   . Cancer Sister   . Cancer Brother   . Cancer Daughter   . Colon cancer Neg Hx    Social History   Tobacco Use  . Smoking status: Former Smoker    Packs/day: 0.50    Years: 20.00    Pack years: 10.00    Types: Cigarettes    Last attempt to quit: 12/21/2010    Years since quitting: 7.2  . Smokeless tobacco: Never Used  . Tobacco comment: smoking cessation info given and reviewed   Substance Use Topics  . Alcohol use: Yes    Alcohol/week: 0.0 oz    Comment: seldom  . Drug use: No    Allergies  Allergen Reactions  . Ciprofloxacin Hcl Hives  . Iohexol Other (See Comments)     Desc: Per [facility], pt is allergic to IV contrast, no type of reaction was available. Pt cannot  remember but first reacted around 15 yrs ago from an IVP. Notes were date from 2009 at urology center., Onset Date: [id number]   . Prozac [Fluoxetine Hcl] Rash  . Sulfonamide Derivatives Hives and Rash     Current Meds  Medication Sig  . acetaminophen (TYLENOL) 325 MG tablet Take 650 mg by mouth every 6 (six) hours as needed for moderate pain or headache.   . ALPRAZolam (XANAX) 0.5 MG tablet Take 1 tablet (0.5 mg total) by mouth 2 (two) times daily as needed for anxiety. (Patient taking differently: Take 0.5 mg by mouth at bedtime. )  . Calcium-Magnesium-Vitamin D (CITRACAL CALCIUM+D) 600-40-500 MG-MG-UNIT TB24 Take 1 tablet by mouth daily.  . citalopram (CELEXA) 40 MG tablet Take 0.5 tablets (20 mg total) by mouth at bedtime.  Marland Kitchen HYDROcodone-acetaminophen (NORCO) 5-325 MG tablet Take 1-2 tablets by mouth every 6 (six) hours as needed.  Marland Kitchen levothyroxine (SYNTHROID, LEVOTHROID) 50 MCG tablet Take 1 tablet (50 mcg total) by mouth daily before breakfast.  . lubiprostone (AMITIZA) 24 MCG capsule Take 1 capsule (24 mcg total) by mouth 2 (two) times daily with a meal.    . magnesium oxide (MAG-OX) 400 MG tablet Take 400 mg by mouth every evening.   . Misc Natural Products (SUPER GREENS PO) Take 1 Scoop by mouth every evening.  . Multiple Vitamin (MULTIVITAMIN WITH MINERALS) TABS tablet Take 1 tablet by mouth daily.  Marland Kitchen omeprazole (PRILOSEC) 20 MG capsule 1 PO 30 MINS PRIOR TO BREAKFAST. (Patient taking differently: Take 20 mg by mouth daily. )  . ondansetron (ZOFRAN) 4 MG tablet Take 1 tablet (4 mg total) by mouth 3 (three) times daily as needed for nausea or vomiting.  . umeclidinium-vilanterol (ANORO ELLIPTA) 62.5-25 MCG/INH AEPB Inhale 1 puff into the lungs daily.    BP 129/72   Pulse 82   Ht 5\' 5"  (1.651 m)   BMI 19.47 kg/m   Physical Exam  Constitutional: She is oriented to person, place, and time. She appears well-developed and well-nourished. No distress.  HENT:  Head: Normocephalic and atraumatic.  Right Ear: External ear normal.  Left Ear: External ear normal.  Nose: Nose normal.  Mouth/Throat: Oropharynx is clear and moist.  Eyes: Pupils are equal, round, and reactive to light. Conjunctivae and EOM are normal. Right eye exhibits no discharge. Left eye exhibits no discharge. No scleral icterus.  Neck: Normal range of motion. Neck supple. No JVD present. No tracheal deviation present. No thyromegaly present.  Cardiovascular: Normal rate, regular rhythm and intact distal pulses. PMI is not displaced.  Pulmonary/Chest: Effort normal. No respiratory distress. She exhibits no tenderness.  Abdominal: Soft. Bowel sounds are normal. She exhibits no distension and no mass.  Musculoskeletal:       Arms:      Legs: Lymphadenopathy:    She has no cervical adenopathy.       Right cervical: No superficial cervical adenopathy present.      Left cervical: No superficial cervical adenopathy present.       Right: No supraclavicular adenopathy present.       Left: No supraclavicular adenopathy present.  Neurological: She is alert and oriented to person,  place, and time. She has normal strength and normal reflexes. She displays no atrophy and no tremor. No cranial nerve deficit or sensory deficit. She exhibits normal muscle tone. Coordination normal.  Skin: Skin is warm, dry and intact. Capillary refill takes less than 2 seconds. No rash noted. Rash is not nodular and not pustular.  She is not diaphoretic. No cyanosis. Nails show no clubbing.  Psychiatric: She has a normal mood and affect. Her speech is normal and behavior is normal. Judgment and thought content normal. Cognition and memory are normal.        Arther Abbott, MD  03/11/2018 2:38 PM

## 2018-03-11 NOTE — Telephone Encounter (Signed)
LMOM for a return call.  

## 2018-03-11 NOTE — Procedures (Signed)
  PATIENT DATA: SB PASSAGE TIME: 4H 45m  RESULTS: LIMITED views of gastric mucosa due to retained contents.  RARE RED BLOOD ON INITIAL VIEWS DUE TO TEAR CREATED BY THE CAPSULE BEING INTRODUCED INTO THE BULB. No ULCERS, masses or AVMs seen.  Occasional lymphangiectasia: 1:56:44. LIMITED VIEWS OF THE COLON DUE TO RETAINED CONTENTS. No old blood in the stomach, small bowel, or colon.  DIAGNOSIS: NORMAL GIVENS STUDY  Plan: 1. CBC IN 3 MOS 2. OPV IN 3 MOS

## 2018-03-11 NOTE — Telephone Encounter (Addendum)
PLEASE CALL PT. HER GIVENS SHOWS NO SOURCE FOR BLOOD LOSS WAS IDENTIFIED IN HER SMALL BOWEL. HER BLOOD COUNT WAS NORMAL IN LATE MAY. SHE SHOULD REPEAT IT IN 3 MOS ONE WEEK PRIOR TO OPV.

## 2018-03-11 NOTE — Telephone Encounter (Signed)
Routing to [place] to call the patient

## 2018-03-11 NOTE — Telephone Encounter (Signed)
Patient called requesting the results from her Givens Capsule that was placed during her EGD on 5/20.  Routing to Dr. Oneida Alar for results  The patient can be reached at [phone].

## 2018-03-12 NOTE — Telephone Encounter (Signed)
Pt is aware of results. Said she fell off of bed recently and hurt her right knee and Dr.Harrison will be doing surgery on it soon. She just wanted to let Dr. Oneida Alar know.

## 2018-03-12 NOTE — Telephone Encounter (Signed)
See other phone note dated 03/11/18

## 2018-03-12 NOTE — Telephone Encounter (Signed)
REVIEWED-NO ADDITIONAL RECOMMENDATIONS. 

## 2018-03-12 NOTE — Telephone Encounter (Signed)
Reminder in epic °

## 2018-03-13 ENCOUNTER — Telehealth: Payer: Self-pay | Admitting: Radiology

## 2018-03-13 NOTE — Telephone Encounter (Signed)
Patient called, she has had some pain from hitting her nose during same fall she had the patella fracture, was not sure if Dr Aline Brochure could take care of this for her. I have advised her primary care could look at this for her, and let her know if she needs to see ENT   To you [facility]

## 2018-03-14 ENCOUNTER — Encounter (HOSPITAL_COMMUNITY)
Admission: RE | Admit: 2018-03-14 | Discharge: 2018-03-14 | Disposition: A | Discharge status: Home / Self Care | Payer: PPO | Source: Ambulatory Visit | Attending: Orthopedic Surgery | Admitting: Orthopedic Surgery

## 2018-03-14 ENCOUNTER — Other Ambulatory Visit (HOSPITAL_COMMUNITY): Payer: PPO

## 2018-03-14 ENCOUNTER — Encounter (HOSPITAL_COMMUNITY): Payer: Self-pay

## 2018-03-14 ENCOUNTER — Encounter: Payer: Self-pay | Admitting: Orthopedic Surgery

## 2018-03-17 ENCOUNTER — Other Ambulatory Visit: Payer: Self-pay | Admitting: Radiology

## 2018-03-17 DIAGNOSIS — S82041A Displaced comminuted fracture of right patella, initial encounter for closed fracture: Secondary | ICD-10-CM

## 2018-03-17 NOTE — Telephone Encounter (Signed)
Patient called with questions including fasting instructions regarding tomorrow's surgery; ph# [phone]

## 2018-03-17 NOTE — Telephone Encounter (Signed)
I have advised her NPO after midnight.

## 2018-03-18 ENCOUNTER — Ambulatory Visit (HOSPITAL_COMMUNITY): Payer: PPO

## 2018-03-18 ENCOUNTER — Ambulatory Visit (HOSPITAL_COMMUNITY): Payer: PPO | Admitting: Anesthesiology

## 2018-03-18 ENCOUNTER — Encounter (HOSPITAL_COMMUNITY)
Admission: RE | Disposition: A | Discharge status: Home / Self Care | Payer: Self-pay | Source: Ambulatory Visit | Attending: Orthopedic Surgery

## 2018-03-18 ENCOUNTER — Ambulatory Visit (HOSPITAL_COMMUNITY)
Admission: RE | Admit: 2018-03-18 | Discharge: 2018-03-18 | Disposition: A | Discharge status: Home / Self Care | Payer: PPO | Source: Ambulatory Visit | Attending: Orthopedic Surgery | Admitting: Orthopedic Surgery

## 2018-03-18 ENCOUNTER — Other Ambulatory Visit: Payer: Self-pay

## 2018-03-18 ENCOUNTER — Encounter (HOSPITAL_COMMUNITY): Payer: Self-pay | Admitting: *Deleted

## 2018-03-18 DIAGNOSIS — Z888 Allergy status to other drugs, medicaments and biological substances status: Secondary | ICD-10-CM | POA: Insufficient documentation

## 2018-03-18 DIAGNOSIS — Z85118 Personal history of other malignant neoplasm of bronchus and lung: Secondary | ICD-10-CM | POA: Insufficient documentation

## 2018-03-18 DIAGNOSIS — Z9981 Dependence on supplemental oxygen: Secondary | ICD-10-CM | POA: Insufficient documentation

## 2018-03-18 DIAGNOSIS — S82041D Displaced comminuted fracture of right patella, subsequent encounter for closed fracture with routine healing: Secondary | ICD-10-CM | POA: Diagnosis not present

## 2018-03-18 DIAGNOSIS — W06XXXA Fall from bed, initial encounter: Secondary | ICD-10-CM | POA: Insufficient documentation

## 2018-03-18 DIAGNOSIS — S82001A Unspecified fracture of right patella, initial encounter for closed fracture: Secondary | ICD-10-CM | POA: Diagnosis not present

## 2018-03-18 DIAGNOSIS — Z87891 Personal history of nicotine dependence: Secondary | ICD-10-CM | POA: Insufficient documentation

## 2018-03-18 DIAGNOSIS — Z8719 Personal history of other diseases of the digestive system: Secondary | ICD-10-CM | POA: Insufficient documentation

## 2018-03-18 DIAGNOSIS — E039 Hypothyroidism, unspecified: Secondary | ICD-10-CM | POA: Insufficient documentation

## 2018-03-18 DIAGNOSIS — Z79899 Other long term (current) drug therapy: Secondary | ICD-10-CM | POA: Diagnosis not present

## 2018-03-18 DIAGNOSIS — F419 Anxiety disorder, unspecified: Secondary | ICD-10-CM | POA: Insufficient documentation

## 2018-03-18 DIAGNOSIS — Z882 Allergy status to sulfonamides status: Secondary | ICD-10-CM | POA: Diagnosis not present

## 2018-03-18 DIAGNOSIS — J449 Chronic obstructive pulmonary disease, unspecified: Secondary | ICD-10-CM | POA: Insufficient documentation

## 2018-03-18 DIAGNOSIS — S82091A Other fracture of right patella, initial encounter for closed fracture: Secondary | ICD-10-CM

## 2018-03-18 DIAGNOSIS — Y92003 Bedroom of unspecified non-institutional (private) residence as the place of occurrence of the external cause: Secondary | ICD-10-CM | POA: Insufficient documentation

## 2018-03-18 DIAGNOSIS — S82001D Unspecified fracture of right patella, subsequent encounter for closed fracture with routine healing: Secondary | ICD-10-CM | POA: Diagnosis not present

## 2018-03-18 DIAGNOSIS — S82041A Displaced comminuted fracture of right patella, initial encounter for closed fracture: Secondary | ICD-10-CM | POA: Diagnosis not present

## 2018-03-18 DIAGNOSIS — F329 Major depressive disorder, single episode, unspecified: Secondary | ICD-10-CM | POA: Diagnosis not present

## 2018-03-18 DIAGNOSIS — K589 Irritable bowel syndrome without diarrhea: Secondary | ICD-10-CM | POA: Diagnosis not present

## 2018-03-18 HISTORY — PX: ORIF PATELLA: SHX5033

## 2018-03-18 LAB — POCT I-STAT 4, (NA,K, GLUC, HGB,HCT)
Glucose, Bld: 95 mg/dL (ref 65–99)
HCT: 39 % (ref 36.0–46.0)
Hemoglobin: 13.3 g/dL (ref 12.0–15.0)
Potassium: 4.2 mmol/L (ref 3.5–5.1)
Sodium: 136 mmol/L (ref 135–145)

## 2018-03-18 SURGERY — OPEN REDUCTION INTERNAL FIXATION (ORIF) PATELLA
Anesthesia: Spinal | Laterality: Right

## 2018-03-18 MED ORDER — PROMETHAZINE HCL 25 MG/ML IJ SOLN: 6.2500 mg | INTRAMUSCULAR | Status: DC | PRN

## 2018-03-18 MED ORDER — HYDROCODONE-ACETAMINOPHEN 7.5-325 MG PO TABS: 1.0000 | ORAL_TABLET | Freq: Once | ORAL | Status: DC | PRN

## 2018-03-18 MED ORDER — FENTANYL CITRATE (PF) 100 MCG/2ML IJ SOLN
INTRAMUSCULAR | Status: DC | PRN
  Administered 2018-03-18: 20 ug via INTRATHECAL
  Administered 2018-03-18: 17.5 ug via INTRAVENOUS

## 2018-03-18 MED ORDER — LACTATED RINGERS IV SOLN
INTRAVENOUS | Status: DC
  Administered 2018-03-18: 12:00:00 via INTRAVENOUS

## 2018-03-18 MED ORDER — PROPOFOL 500 MG/50ML IV EMUL
INTRAVENOUS | Status: DC | PRN
  Administered 2018-03-18: 25 ug/kg/min via INTRAVENOUS

## 2018-03-18 MED ORDER — BUPIVACAINE-EPINEPHRINE (PF) 0.5% -1:200000 IJ SOLN
INTRAMUSCULAR | Status: DC | PRN
  Administered 2018-03-18: 40 mL

## 2018-03-18 MED ORDER — BUPIVACAINE-EPINEPHRINE (PF) 0.5% -1:200000 IJ SOLN
INTRAMUSCULAR | Status: AC
  Filled 2018-03-18: qty 60

## 2018-03-18 MED ORDER — FENTANYL CITRATE (PF) 100 MCG/2ML IJ SOLN
INTRAMUSCULAR | Status: AC
  Filled 2018-03-18: qty 2

## 2018-03-18 MED ORDER — MEPERIDINE HCL 50 MG/ML IJ SOLN: 6.2500 mg | INTRAMUSCULAR | Status: DC | PRN

## 2018-03-18 MED ORDER — EPINEPHRINE PF 1 MG/ML IJ SOLN
INTRAMUSCULAR | Status: AC
  Filled 2018-03-18: qty 1

## 2018-03-18 MED ORDER — CEFAZOLIN SODIUM-DEXTROSE 2-4 GM/100ML-% IV SOLN
2.0000 g | INTRAVENOUS | Status: AC
  Administered 2018-03-18: 2 g via INTRAVENOUS
  Filled 2018-03-18: qty 100

## 2018-03-18 MED ORDER — LACTATED RINGERS IV SOLN: INTRAVENOUS | Status: DC

## 2018-03-18 MED ORDER — EPHEDRINE SULFATE 50 MG/ML IJ SOLN
INTRAMUSCULAR | Status: AC
  Filled 2018-03-18: qty 1

## 2018-03-18 MED ORDER — SODIUM CHLORIDE 0.9 % IR SOLN
Status: DC | PRN
  Administered 2018-03-18: 1000 mL

## 2018-03-18 MED ORDER — SODIUM CHLORIDE 0.9 % IJ SOLN
INTRAMUSCULAR | Status: AC
  Filled 2018-03-18: qty 10

## 2018-03-18 MED ORDER — BUPIVACAINE IN DEXTROSE 0.75-8.25 % IT SOLN
INTRATHECAL | Status: DC | PRN
  Administered 2018-03-18: 12.5 mg via INTRATHECAL

## 2018-03-18 MED ORDER — HYDROMORPHONE HCL 1 MG/ML IJ SOLN: 0.2500 mg | INTRAMUSCULAR | Status: DC | PRN

## 2018-03-18 MED ORDER — HYDROCODONE-ACETAMINOPHEN 7.5-325 MG PO TABS: 1.0000 | ORAL_TABLET | ORAL | 0 refills | Status: DC | PRN

## 2018-03-18 MED ORDER — CHLORHEXIDINE GLUCONATE 4 % EX LIQD: 60.0000 mL | Freq: Once | CUTANEOUS | Status: DC

## 2018-03-18 MED ORDER — PROPOFOL 10 MG/ML IV BOLUS
INTRAVENOUS | Status: AC
  Filled 2018-03-18: qty 20

## 2018-03-18 MED ORDER — BUPIVACAINE IN DEXTROSE 0.75-8.25 % IT SOLN
INTRATHECAL | Status: AC
Start: 2018-03-18 — End: ?
  Filled 2018-03-18: qty 2

## 2018-03-18 SURGICAL SUPPLY — 50 items
18 gauge wire ×1 IMPLANT
BANDAGE ESMARK 6X9 LF (GAUZE/BANDAGES/DRESSINGS) ×1 IMPLANT
BIT DRILL 2.8X128 (BIT) ×1 IMPLANT
BLADE SURG SZ10 CARB STEEL (BLADE) ×2 IMPLANT
BNDG CMPR 9X6 STRL LF SNTH (GAUZE/BANDAGES/DRESSINGS) ×2
BNDG COHESIVE 4X5 TAN STRL (GAUZE/BANDAGES/DRESSINGS) ×1 IMPLANT
BNDG ESMARK 6X9 LF (GAUZE/BANDAGES/DRESSINGS) ×4
CHLORAPREP W/TINT 26ML (MISCELLANEOUS) ×2 IMPLANT
CLOTH BEACON ORANGE TIMEOUT ST (SAFETY) ×2 IMPLANT
COVER LIGHT HANDLE STERIS (MISCELLANEOUS) ×4 IMPLANT
CUFF TOURNIQUET SINGLE 24IN (TOURNIQUET CUFF) ×1 IMPLANT
DECANTER SPIKE VIAL GLASS SM (MISCELLANEOUS) ×4 IMPLANT
DRAPE C-ARM FOLDED MOBILE STRL (DRAPES) ×2 IMPLANT
DRAPE HALF SHEET 40X57 (DRAPES) ×1 IMPLANT
DRAPE INCISE IOBAN 66X45 STRL (DRAPES) ×2 IMPLANT
DRAPE PROXIMA HALF (DRAPES) ×2 IMPLANT
DRSG MEPILEX BORDER 4X12 (GAUZE/BANDAGES/DRESSINGS) ×2 IMPLANT
ELECT REM PT RETURN 9FT ADLT (ELECTROSURGICAL) ×2
ELECTRODE REM PT RTRN 9FT ADLT (ELECTROSURGICAL) ×1 IMPLANT
GLOVE BIO SURGEON STRL SZ7 (GLOVE) ×1 IMPLANT
GLOVE BIOGEL PI IND STRL 7.0 (GLOVE) ×1 IMPLANT
GLOVE BIOGEL PI INDICATOR 7.0 (GLOVE) ×2
GLOVE ECLIPSE 6.5 STRL STRAW (GLOVE) ×1 IMPLANT
GLOVE SKINSENSE NS SZ8.0 LF (GLOVE) ×1
GLOVE SKINSENSE STRL SZ8.0 LF (GLOVE) ×1 IMPLANT
GLOVE SS N UNI LF 8.5 STRL (GLOVE) ×2 IMPLANT
GOWN STRL REUS W/ TWL LRG LVL3 (GOWN DISPOSABLE) ×2 IMPLANT
GOWN STRL REUS W/TWL LRG LVL3 (GOWN DISPOSABLE) ×4
GOWN STRL REUS W/TWL XL LVL3 (GOWN DISPOSABLE) ×2 IMPLANT
IMMOBILIZER KNEE 19 UNV (ORTHOPEDIC SUPPLIES) ×1 IMPLANT
INST SET MINOR BONE (KITS) ×2 IMPLANT
K-WIRE DBL PT .062 (WIRE) ×4 IMPLANT
K-wire ×2 IMPLANT
KIT TURNOVER KIT A (KITS) ×2 IMPLANT
MANIFOLD NEPTUNE II (INSTRUMENTS) ×2 IMPLANT
NDL HYPO 21X1.5 SAFETY (NEEDLE) ×1 IMPLANT
NEEDLE HYPO 21X1.5 SAFETY (NEEDLE) ×2 IMPLANT
NS IRRIG 1000ML POUR BTL (IV SOLUTION) ×2 IMPLANT
PACK BASIC LIMB (CUSTOM PROCEDURE TRAY) ×2 IMPLANT
PAD ARMBOARD 7.5X6 YLW CONV (MISCELLANEOUS) ×2 IMPLANT
PASSER SUT SWANSON 36MM LOOP (INSTRUMENTS) ×2 IMPLANT
SET BASIN LINEN APH (SET/KITS/TRAYS/PACK) ×2 IMPLANT
SPONGE LAP 18X18 X RAY DECT (DISPOSABLE) ×2 IMPLANT
STAPLER VISISTAT 35W (STAPLE) ×1 IMPLANT
SUT ETHIBOND 5 LR DA (SUTURE) ×2 IMPLANT
SUT MON AB 0 CT1 (SUTURE) ×2 IMPLANT
SUT VIC AB 1 CT1 27 (SUTURE) ×2
SUT VIC AB 1 CT1 27XBRD ANTBC (SUTURE) IMPLANT
SYR 30ML LL (SYRINGE) ×2 IMPLANT
SYR BULB IRRIGATION 50ML (SYRINGE) ×2 IMPLANT

## 2018-03-18 NOTE — Anesthesia Procedure Notes (Deleted)
Spinal

## 2018-03-18 NOTE — H&P (Signed)
Outpatient surgery history and physical   Chief Complaint  Patient presents with  . Knee Pain      right patella fracture date of injury 03/05/18        MEDICAL DECISION SECTION  xrays ordered? NO   My independent reading of xrays:  4 view of the right knee   I see a transverse comminuted patella fracture slight displacement.         Encounter Diagnosis  Name Primary?  . Closed displaced comminuted fracture of right patella, initial encounter, DOI 03/05/2018 Yes        PLAN:  Open treatment internal fixation right patella   The procedure has been fully reviewed with the patient; The risks and benefits of surgery have been discussed and explained and understood. Alternative treatment has also been reviewed, questions were encouraged and answered. The postoperative plan is also been reviewed.     I have advised the patient that she is at high risk for complications related to the surgery based on her medical history as well as her poor bone density she is on calcium and vitamin D.         Chief Complaint  Patient presents with  . Knee Pain      right patella fracture date of injury 03/05/18      77 year old female with a history of lung cancer several fractures currently ambulates with a  walker lives alone and is on home oxygen since 2016, history of emphysema fell out of bed on 29 May injured her right patella x-rays show a, transverse comminuted displaced fracture she has demonstrated loss of integrity of the extensor mechanism presents with pain over the patella some swelling of the right ankle with ecchymosis as well as tenderness over the right patella with loss of integrity of her ability to stand on the right leg.   HAD ECHO: 5/19 EF 60%     Review of Systems  Respiratory: Positive for shortness of breath.   Cardiovascular: Negative.   Musculoskeletal: Positive for back pain and joint pain.  All other systems reviewed and are negative.           Past Medical  History:  Diagnosis Date  . 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 Allergic rhinitis    . 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 Anxiety disorder    . Aortic aneurysm ([place])    . Complication of anesthesia 2011    bloodpressure dropped during colonoscopy, not problems since  . Compression fracture    . COPD (chronic obstructive pulmonary disease) ([place])    . Depression    . Diastolic dysfunction    . Diverticulosis    . Dysphagia    . Emphysema lung ([place]) 11/08/2014  . Hiatal hernia    . Hypothyroidism    . IBS (irritable bowel syndrome)    . Iron deficiency anemia due to chronic blood loss 12/05/2016  . On home O2 12/15/2013    chronic hypoxia  . PUD (peptic ulcer disease)      71yrs  . Shortness of breath      with exertion  . SIADH (syndrome of inappropriate ADH production) ([place])    . Trigeminal neuralgia             Past Surgical History:  Procedure Laterality Date  . ABDOMINAL HYSTERECTOMY      . BACK SURGERY   January 13, 2014  . CATARACT EXTRACTION      . CATARACT EXTRACTION W/PHACO   09/02/2012    Procedure: CATARACT EXTRACTION PHACO AND INTRAOCULAR LENS PLACEMENT (IOC);  Surgeon: Elta Guadeloupe T. Gershon Crane, MD;  Location: [facility];  Service: Ophthalmology;  Laterality: Left;  CDE:10.35  . CHOLECYSTECTOMY      . 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 COLONOSCOPY N/A 11/23/2014    Dr. Oneida Alar: moderate diverticulosis, hemorrhoids, redundant colon. next TCS in 10-15 years.   Marland Kitchen ENDARTERECTOMY Right 08/21/2013    Procedure: RIGHT CAROTID ANEURYSM RESECTION;  Surgeon: Rosetta Posner, MD;  Location: [place];  Service: Vascular;  Laterality: Right;  . ESOPHAGEAL DILATION   02/24/2018    Procedure: ESOPHAGEAL DILATION;  Surgeon: Danie Binder, MD;  [facility];  Service: Endoscopy;;  . ESOPHAGOGASTRODUODENOSCOPY   11/13/2009    w/dilation to 47mm, gastric ulceration (H.Pylori) s/p treatment   . ESOPHAGOGASTRODUODENOSCOPY   11/21/2009    distal esophageal web, gastritis  . ESOPHAGOGASTRODUODENOSCOPY   03/14/12    VOH:[id number] in the distal esophagus/Mild gastritis/small HH. + H.pylori, prescribed Pylera. Finished treatment.   . ESOPHAGOGASTRODUODENOSCOPY N/A 12/13/2017    Procedure: ESOPHAGOGASTRODUODENOSCOPY (EGD);  Surgeon: Danie Binder, MD;  [facility];  Service: Endoscopy;  Laterality: N/A;  8:30am  . ESOPHAGOGASTRODUODENOSCOPY N/A 02/24/2018    Procedure: ESOPHAGOGASTRODUODENOSCOPY (EGD);  Surgeon: Danie Binder, MD;  [facility];  Service: Endoscopy;  Laterality: N/A;  11:00am  . fatty tumor removal from lt groin      . FRACTURE SURGERY Left      hand and left ring finger  . GIVENS CAPSULE STUDY N/A 12/26/2017    Procedure: GIVENS CAPSULE STUDY;  Surgeon: Danie Binder, MD;  [facility];  Service: Endoscopy;  Laterality: N/A;  7:30am  . GIVENS CAPSULE STUDY N/A 02/24/2018    Procedure: GIVENS CAPSULE STUDY;  Surgeon: Danie Binder, MD;  [facility];  Service: Endoscopy;  Laterality: N/A;  . LUNG CANCER SURGERY   12/2010    Left VATS, minithoracotomy, LLL superior segmentectomy  . ORIF WRIST FRACTURE Left 04/09/2014    Procedure: OPEN REDUCTION INTERNAL FIXATION (ORIF) WRIST FRACTURE;  Surgeon: Renette Butters, MD;  Location: [place];  Service: Orthopedics;  Laterality: Left;  . PERCUTANEOUS PINNING Left 04/09/2014    Procedure: PERCUTANEOUS PINNING EXTREMITY;  Surgeon: Renette Butters, MD;  Location: [place];  Service: Orthopedics;  Laterality: Left;  . SAVORY DILATION N/A 12/13/2017    Procedure: SAVORY DILATION;  Surgeon: Danie Binder, MD;  [facility];  Service: Endoscopy;  Laterality: N/A;  . TUBAL LIGATION      . vocal cord surgery   02/06/2011    laryngoscopy with bilateral vocal cord Radiesse injection for vocal cord paralysis  . YAG LASER APPLICATION Left 69/48/5462    Procedure: YAG LASER APPLICATION;   Surgeon: Rutherford Guys, MD;  Location: [facility];  Service: Ophthalmology;  Laterality: Left;           Family History  Problem Relation Age of Onset  . Pulmonary embolism Mother    . Heart attack Father    . Hypertension Father    . Liver cancer Sister    . Cancer Sister    . Cancer Brother    . Cancer Daughter    . Colon cancer Neg Hx      Social History  Tobacco Use  . Smoking status: Former Smoker      Packs/day: 0.50      Years: 20.00      Pack years: 10.00      Types: Cigarettes      Last attempt to quit: 12/21/2010      Years since quitting: 7.2  . Smokeless tobacco: Never Used  . Tobacco comment: smoking cessation info given and reviewed   Substance Use Topics  . Alcohol use: Yes      Alcohol/week: 0.0 oz      Comment: seldom  . Drug use: No           Allergies  Allergen Reactions  . Ciprofloxacin Hcl Hives  . Iohexol Other (See Comments)       Desc: Per [facility], pt is allergic to IV contrast, no type of reaction was available. Pt cannot remember but first reacted around 15 yrs ago from an IVP. Notes were date from 2009 at urology center., Onset Date: [id number]    . Prozac [Fluoxetine Hcl] Rash  . Sulfonamide Derivatives Hives and Rash        Active Medications      Current Meds  Medication Sig  . acetaminophen (TYLENOL) 325 MG tablet Take 650 mg by mouth every 6 (six) hours as needed for moderate pain or headache.   . ALPRAZolam (XANAX) 0.5 MG tablet Take 1 tablet (0.5 mg total) by mouth 2 (two) times daily as needed for anxiety. (Patient taking differently: Take 0.5 mg by mouth at bedtime. )  . Calcium-Magnesium-Vitamin D (CITRACAL CALCIUM+D) 600-40-500 MG-MG-UNIT TB24 Take 1 tablet by mouth daily.  . citalopram (CELEXA) 40 MG tablet Take 0.5 tablets (20 mg total) by mouth at bedtime.  Marland Kitchen HYDROcodone-acetaminophen (NORCO) 5-325 MG tablet Take 1-2 tablets by mouth every 6 (six) hours as needed.  Marland Kitchen levothyroxine (SYNTHROID, LEVOTHROID) 50 MCG  tablet Take 1 tablet (50 mcg total) by mouth daily before breakfast.  . lubiprostone (AMITIZA) 24 MCG capsule Take 1 capsule (24 mcg total) by mouth 2 (two) times daily with a meal.  . magnesium oxide (MAG-OX) 400 MG tablet Take 400 mg by mouth every evening.   . Misc Natural Products (SUPER GREENS PO) Take 1 Scoop by mouth every evening.  . Multiple Vitamin (MULTIVITAMIN WITH MINERALS) TABS tablet Take 1 tablet by mouth daily.  Marland Kitchen omeprazole (PRILOSEC) 20 MG capsule 1 PO 30 MINS PRIOR TO BREAKFAST. (Patient taking differently: Take 20 mg by mouth daily. )  . ondansetron (ZOFRAN) 4 MG tablet Take 1 tablet (4 mg total) by mouth 3 (three) times daily as needed for nausea or vomiting.  . umeclidinium-vilanterol (ANORO ELLIPTA) 62.5-25 MCG/INH AEPB Inhale 1 puff into the lungs daily.        BP 129/72   Pulse 82   Ht 5\' 5"  (1.651 m)   BMI 19.47 kg/m    Physical Exam  Constitutional: She is oriented to person, place, and time. She appears well-developed and well-nourished. No distress.  HENT:  Head: Normocephalic and atraumatic.  Right Ear: External ear normal.  Left Ear: External ear normal.  Nose: Nose normal.  Mouth/Throat: Oropharynx is clear and moist.  Eyes: Pupils are equal, round, and reactive to light. Conjunctivae and EOM are normal. Right eye exhibits no discharge. Left eye exhibits no discharge. No scleral icterus.  Neck: Normal range of motion. Neck supple. No JVD present. No tracheal deviation present. No thyromegaly present.  Cardiovascular: Normal rate, regular rhythm and intact distal pulses.  PMI is not displaced.  Pulmonary/Chest: Effort normal. No respiratory distress. She exhibits no tenderness.  Abdominal: Soft. Bowel sounds are normal. She exhibits no distension and no mass.  Musculoskeletal:       Arms:      Legs: Lymphadenopathy:    She has no cervical adenopathy.       Right cervical: No superficial cervical adenopathy present.      Left cervical: No superficial  cervical adenopathy present.       Right: No supraclavicular adenopathy present.       Left: No supraclavicular adenopathy present.  Neurological: She is alert and oriented to person, place, and time. She has normal strength and normal reflexes. She displays no atrophy and no tremor. No cranial nerve deficit or sensory deficit. She exhibits normal muscle tone. Coordination normal.  Skin: Skin is warm, dry and intact. Capillary refill takes less than 2 seconds. No rash noted. Rash is not nodular and not pustular. She is not diaphoretic. No cyanosis. Nails show no clubbing.  Psychiatric: She has a normal mood and affect. Her speech is normal and behavior is normal. Judgment and thought content normal. Cognition and memory are normal.              Arther Abbott, MD

## 2018-03-18 NOTE — Discharge Instructions (Signed)

## 2018-03-18 NOTE — Interval H&P Note (Signed)
History and Physical Interval Note:  03/18/2018 1:10 PM  [name]  has presented today for surgery, with the diagnosis of right patella fracture  The various methods of treatment have been discussed with the patient and family. After consideration of risks, benefits and other options for treatment, the patient has consented to  Procedure(s): OPEN REDUCTION INTERNAL (ORIF) FIXATION PATELLA (Right) as a surgical intervention .  The patient's history has been reviewed, patient examined, no change in status, stable for surgery.  I have reviewed the patient's chart and labs.  Questions were answered to the patient's satisfaction.     Arther Abbott

## 2018-03-18 NOTE — Anesthesia Procedure Notes (Signed)
Spinal  Patient location during procedure: pre-op Start time: 03/18/2018 1:24 PM End time: 03/18/2018 1:26 PM Staffing Anesthesiologist: Vena Rua, MD Preanesthetic Checklist Completed: patient identified, site marked, surgical consent, pre-op evaluation, timeout performed, IV checked, risks and benefits discussed and monitors and equipment checked Spinal Block Patient position: sitting Prep: Betadine Patient monitoring: heart rate, continuous pulse ox and blood pressure Approach: midline Location: L4-5 Injection technique: single-shot Needle Needle type: Pencan  Needle gauge: 24 G Needle length: 10 cm Needle insertion depth: 5 cm Additional Notes Prep and drape.1 cc1%at approxL4/5 below levelofsurgicallumbar rods.Left scoliosis.#24g Pencan x one.  Clear CSF. Heme/parasthesia free.  positive csf at half and full dose. Back cleaned.  Supine with VSS.  BGC

## 2018-03-18 NOTE — Anesthesia Procedure Notes (Signed)
Procedure Name: MAC Date/Time: 03/18/2018 1:24 PM Performed by: Vista Deck, CRNA Pre-anesthesia Checklist: Patient identified, Emergency Drugs available, Suction available, Timeout performed and Patient being monitored Patient Re-evaluated:Patient Re-evaluated prior to induction Oxygen Delivery Method: Nasal Cannula

## 2018-03-18 NOTE — Anesthesia Postprocedure Evaluation (Signed)
Anesthesia Post Note  Patient: [name]  Procedure(s) Performed: OPEN REDUCTION INTERNAL (ORIF) FIXATION RIGHT PATELLA (Right )  Patient location during evaluation: PACU Anesthesia Type: Spinal Level of consciousness: awake and alert and patient cooperative Respiratory status: spontaneous breathing Cardiovascular status: stable Postop Assessment: spinal receding and no apparent nausea or vomiting Anesthetic complications: no     Last Vitals:  Vitals:   03/18/18 1530 03/18/18 1545  BP: 116/62   Pulse: (!) 58 60  Resp: 11 13  Temp:    SpO2: 99% 99%    Last Pain:  Vitals:   03/18/18 1530  TempSrc:   PainSc: 0-No pain                 Tisa Weisel

## 2018-03-18 NOTE — Op Note (Signed)
03/18/2018  2:50 PM  PATIENT:  Athira S Standlee  77 y.o. female  PRE-OPERATIVE DIAGNOSIS:  right patella fracture  POST-OPERATIVE DIAGNOSIS:  right patella fracture  PROCEDURE:  Procedure(s): OPEN REDUCTION INTERNAL (ORIF) FIXATION RIGHT PATELLA (Right) [phone]  Implants 2 #0.62 K wires with 1 18-gauge wire in figure-of-eight fashion with one #5 Ethibond cerclage suture  Operative findings comminuted fracture transverse right patella with one vertical component of the inferior fragment  1 osteochondral depression fracture inferomedial patella  Details of surgery.  The patient was identified in preop her chart was reviewed her site was confirmed his right knee and marked.  In the PACU the patient had straight leg raise with a 45 degree extensor lag  Patient was taken to surgery for spinal anesthesia.  She was placed supine a tourniquet was placed on her right thigh the leg was prepped and draped sterilely.  Timeout was completed.  The limb was exsanguinated with a 6 inch Esmarch and the tourniquet was elevated to 300 mmHg  A midline incision was made down to the extensor mechanism.  The fracture was not visualized from the anterior superior aspect of the patella but a medial arthrotomy was performed and the fracture was visualized inferiorly.  It was anatomically reduced except for a small 5-7 Miller midair chondral defect on the posterior medial inferior part of the patella  I placed a clamp and 2 K wires in the superior half of the patella and then passed a number 18-gauge wire and took an x-ray.  X-ray look good.  The knee was flexed and extended 125 degrees the fracture was well reduced  The wires were bent cut and then the 18-gauge wire was finally tightened cut and buried.  All points of the wires were buried.  #5 suture was then passed in the same fashion as the 18-gauge wire in cerclage fashion.  The wound was irrigated and closed with #1 Vicryl for the arthrotomy followed by 0  Monocryl for the subcu layer and staples for the skin.  We injected into the joint to Marcaine with epinephrine 60 cc  I was assisted by Simonne Maffucci  Spinal anesthetic  We injected epinephrine with Marcaine a total of 60 cc  No specimens  Case was contaminated by a gnat on the back table  SURGEON:  Surgeon(s) and Role:    Carole Civil, MD - Primary  PHYSICIAN ASSISTANT:    EBL:  20 mL    COUNTS: Correct YES  TOURNIQUET:   Total Tourniquet Time Documented: Thigh (Right) - 48 minutes Total: Thigh (Right) - 48 minutes   DICTATION: .Viviann Spare Dictation  PLAN OF CARE: Discharge to home after PACU  PATIENT DISPOSITION:  PACU - hemodynamically stable.   Delay start of Pharmacological VTE agent (>24hrs) due to surgical blood loss or risk of bleeding: not applicable

## 2018-03-18 NOTE — Brief Op Note (Signed)
03/18/2018  2:50 PM  PATIENT:  [name]  77 y.o. female  PRE-OPERATIVE DIAGNOSIS:  right patella fracture  POST-OPERATIVE DIAGNOSIS:  right patella fracture  PROCEDURE:  Procedure(s): OPEN REDUCTION INTERNAL (ORIF) FIXATION RIGHT PATELLA (Right)   Implants 2 #0.62 K wires with 1 18-gauge wire in figure-of-eight fashion with one #5 Ethibond cerclage suture  Operative findings comminuted fracture transverse right patella with one vertical component of the inferior fragment  1 osteochondral depression fracture inferomedial patella  I was assisted by Simonne Maffucci  Spinal anesthetic  We injected epinephrine with Marcaine a total of 60 cc  No specimens  Case was contaminated by a gnat on the back table  SURGEON:  Surgeon(s) and Role:    Carole Civil, MD - Primary  PHYSICIAN ASSISTANT:    EBL:  20 mL    COUNTS: Correct YES  TOURNIQUET:   Total Tourniquet Time Documented: Thigh (Right) - 48 minutes Total: Thigh (Right) - 48 minutes   DICTATION: .Viviann Spare Dictation  PLAN OF CARE: Discharge to home after PACU  PATIENT DISPOSITION:  PACU - hemodynamically stable.   Delay start of Pharmacological VTE agent (>24hrs) due to surgical blood loss or risk of bleeding: not applicable

## 2018-03-18 NOTE — Anesthesia Preprocedure Evaluation (Signed)
Anesthesia Evaluation  Patient identified by MRN, date of birth, ID band Patient awake    Reviewed: Allergy & Precautions, H&P , NPO status , Patient's Chart, lab work & pertinent test results, reviewed documented beta blocker date and time   History of Anesthesia Complications (+) history of anesthetic complications  Airway Mallampati: II  TM Distance: >3 FB Neck ROM: full    Dental no notable dental hx. (+) Poor Dentition, Missing   Pulmonary neg pulmonary ROS, shortness of breath, with exertion and Long-Term Oxygen Therapy, COPD,  COPD inhaler and oxygen dependent, former smoker,    Pulmonary exam normal breath sounds clear to auscultation       Cardiovascular Exercise Tolerance: Good + Peripheral Vascular Disease  negative cardio ROS   Rhythm:regular Rate:Normal     Neuro/Psych Anxiety Depression  Neuromuscular disease negative neurological ROS  negative psych ROS   GI/Hepatic negative GI ROS, Neg liver ROS, hiatal hernia, PUD, Patient received Oral Contrast Agents,  Endo/Other  negative endocrine ROSHypothyroidism   Renal/GU negative Renal ROS  negative genitourinary   Musculoskeletal   Abdominal   Peds  Hematology negative hematology ROS (+) anemia ,   Anesthesia Other Findings h/o hypotension from anesthesia for Endo- resolved 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 No anticoagulation Extensive thoracic/upper lumbar rods/screws  Reproductive/Obstetrics negative OB ROS                             Anesthesia Physical Anesthesia Plan  ASA: IV  Anesthesia Plan: Spinal   Post-op Pain Management:    Induction:   PONV Risk Score and Plan:   Airway Management Planned:   Additional Equipment:   Intra-op Plan:   Post-operative Plan:   Informed Consent: I have reviewed the patients History and Physical, chart, labs and discussed the procedure including the risks, benefits and  alternatives for the proposed anesthesia with the patient or authorized representative who has indicated his/her understanding and acceptance.   Dental Advisory Given  Plan Discussed with: CRNA and Anesthesiologist  Anesthesia Plan Comments:         Anesthesia Quick Evaluation

## 2018-03-18 NOTE — Transfer of Care (Signed)
Immediate Anesthesia Transfer of Care Note  Patient: [name]  Procedure(s) Performed: OPEN REDUCTION INTERNAL (ORIF) FIXATION RIGHT PATELLA (Right )  Patient Location: PACU  Anesthesia Type:Spinal  Level of Consciousness: awake, alert  and patient cooperative  Airway & Oxygen Therapy: Patient Spontanous Breathing and Patient connected to nasal cannula oxygen  Post-op Assessment: Report given to RN and Post -op Vital signs reviewed and stable  Post vital signs: Reviewed and stable  Last Vitals:  Vitals Value Taken Time  BP    Temp    Pulse    Resp    SpO2      Last Pain:  Vitals:   03/18/18 1117  TempSrc: Oral  PainSc: 0-No pain         Complications: No apparent anesthesia complications

## 2018-03-19 ENCOUNTER — Telehealth: Payer: Self-pay | Admitting: Radiology

## 2018-03-19 ENCOUNTER — Encounter (HOSPITAL_COMMUNITY): Payer: Self-pay | Admitting: Orthopedic Surgery

## 2018-03-19 NOTE — Addendum Note (Signed)
Addendum  created 03/19/18 1232 by Vista Deck, CRNA   Attestation recorded in [place], [place] filed

## 2018-03-19 NOTE — Telephone Encounter (Signed)
Patient complains of spasms of her leg/ knee surgery yesterday. Is asking if she can have something to help with this. Please advise

## 2018-03-20 MED ORDER — CYCLOBENZAPRINE HCL 10 MG PO TABS: 10.0000 mg | ORAL_TABLET | Freq: Three times a day (TID) | ORAL | 0 refills | Status: DC | PRN

## 2018-03-20 NOTE — Telephone Encounter (Signed)
Call in flexeril 10 q6

## 2018-03-22 ENCOUNTER — Encounter (HOSPITAL_COMMUNITY): Payer: Self-pay

## 2018-03-22 ENCOUNTER — Other Ambulatory Visit: Payer: Self-pay

## 2018-03-22 ENCOUNTER — Emergency Department (HOSPITAL_COMMUNITY): Payer: PPO

## 2018-03-22 ENCOUNTER — Inpatient Hospital Stay (HOSPITAL_COMMUNITY)
Admission: EM | Admit: 2018-03-22 | Discharge: 2018-03-25 | DRG: 920 | Disposition: A | Discharge status: Home Health | Payer: PPO | Attending: Orthopedic Surgery | Admitting: Orthopedic Surgery

## 2018-03-22 ENCOUNTER — Encounter: Payer: Self-pay | Admitting: Orthopedic Surgery

## 2018-03-22 DIAGNOSIS — R700 Elevated erythrocyte sedimentation rate: Secondary | ICD-10-CM | POA: Diagnosis present

## 2018-03-22 DIAGNOSIS — E039 Hypothyroidism, unspecified: Secondary | ICD-10-CM | POA: Diagnosis present

## 2018-03-22 DIAGNOSIS — Z87891 Personal history of nicotine dependence: Secondary | ICD-10-CM

## 2018-03-22 DIAGNOSIS — G500 Trigeminal neuralgia: Secondary | ICD-10-CM | POA: Diagnosis present

## 2018-03-22 DIAGNOSIS — Y838 Other surgical procedures as the cause of abnormal reaction of the patient, or of later complication, without mention of misadventure at the time of the procedure: Secondary | ICD-10-CM | POA: Diagnosis present

## 2018-03-22 DIAGNOSIS — Z9842 Cataract extraction status, left eye: Secondary | ICD-10-CM | POA: Diagnosis not present

## 2018-03-22 DIAGNOSIS — Z888 Allergy status to other drugs, medicaments and biological substances status: Secondary | ICD-10-CM | POA: Diagnosis not present

## 2018-03-22 DIAGNOSIS — R0902 Hypoxemia: Secondary | ICD-10-CM | POA: Diagnosis present

## 2018-03-22 DIAGNOSIS — E871 Hypo-osmolality and hyponatremia: Secondary | ICD-10-CM | POA: Diagnosis present

## 2018-03-22 DIAGNOSIS — Z90710 Acquired absence of both cervix and uterus: Secondary | ICD-10-CM | POA: Diagnosis not present

## 2018-03-22 DIAGNOSIS — Z79890 Hormone replacement therapy (postmenopausal): Secondary | ICD-10-CM | POA: Diagnosis not present

## 2018-03-22 DIAGNOSIS — J449 Chronic obstructive pulmonary disease, unspecified: Secondary | ICD-10-CM | POA: Diagnosis not present

## 2018-03-22 DIAGNOSIS — Z961 Presence of intraocular lens: Secondary | ICD-10-CM | POA: Diagnosis present

## 2018-03-22 DIAGNOSIS — F419 Anxiety disorder, unspecified: Secondary | ICD-10-CM | POA: Diagnosis not present

## 2018-03-22 DIAGNOSIS — Z91041 Radiographic dye allergy status: Secondary | ICD-10-CM | POA: Diagnosis not present

## 2018-03-22 DIAGNOSIS — Z9981 Dependence on supplemental oxygen: Secondary | ICD-10-CM

## 2018-03-22 DIAGNOSIS — F329 Major depressive disorder, single episode, unspecified: Secondary | ICD-10-CM | POA: Diagnosis present

## 2018-03-22 DIAGNOSIS — T8189XA Other complications of procedures, not elsewhere classified, initial encounter: Principal | ICD-10-CM | POA: Diagnosis present

## 2018-03-22 DIAGNOSIS — L03115 Cellulitis of right lower limb: Secondary | ICD-10-CM | POA: Diagnosis present

## 2018-03-22 DIAGNOSIS — M7989 Other specified soft tissue disorders: Secondary | ICD-10-CM | POA: Diagnosis not present

## 2018-03-22 DIAGNOSIS — Z85118 Personal history of other malignant neoplasm of bronchus and lung: Secondary | ICD-10-CM | POA: Diagnosis not present

## 2018-03-22 DIAGNOSIS — K589 Irritable bowel syndrome without diarrhea: Secondary | ICD-10-CM | POA: Diagnosis not present

## 2018-03-22 DIAGNOSIS — Z79899 Other long term (current) drug therapy: Secondary | ICD-10-CM

## 2018-03-22 DIAGNOSIS — Z881 Allergy status to other antibiotic agents status: Secondary | ICD-10-CM

## 2018-03-22 DIAGNOSIS — Z8711 Personal history of peptic ulcer disease: Secondary | ICD-10-CM

## 2018-03-22 DIAGNOSIS — Z4789 Encounter for other orthopedic aftercare: Secondary | ICD-10-CM | POA: Diagnosis not present

## 2018-03-22 DIAGNOSIS — D509 Iron deficiency anemia, unspecified: Secondary | ICD-10-CM | POA: Diagnosis not present

## 2018-03-22 DIAGNOSIS — Z882 Allergy status to sulfonamides status: Secondary | ICD-10-CM | POA: Diagnosis not present

## 2018-03-22 DIAGNOSIS — T8149XA Infection following a procedure, other surgical site, initial encounter: Secondary | ICD-10-CM | POA: Diagnosis present

## 2018-03-22 DIAGNOSIS — T8141XA Infection following a procedure, superficial incisional surgical site, initial encounter: Secondary | ICD-10-CM | POA: Diagnosis not present

## 2018-03-22 LAB — CBC WITH DIFFERENTIAL/PLATELET
Basophils Absolute: 0 10*3/uL (ref 0.0–0.1)
Basophils Relative: 0 %
Eosinophils Absolute: 0.1 10*3/uL (ref 0.0–0.7)
Eosinophils Relative: 1 %
HCT: 34.7 % — ABNORMAL LOW (ref 36.0–46.0)
Hemoglobin: 11.2 g/dL — ABNORMAL LOW (ref 12.0–15.0)
Lymphocytes Relative: 13 %
Lymphs Abs: 0.9 10*3/uL (ref 0.7–4.0)
MCH: 30.1 pg (ref 26.0–34.0)
MCHC: 32.3 g/dL (ref 30.0–36.0)
MCV: 93.3 fL (ref 78.0–100.0)
Monocytes Absolute: 0.6 10*3/uL (ref 0.1–1.0)
Monocytes Relative: 8 %
Neutro Abs: 5.5 10*3/uL (ref 1.7–7.7)
Neutrophils Relative %: 78 %
Platelets: 414 10*3/uL — ABNORMAL HIGH (ref 150–400)
RBC: 3.72 MIL/uL — ABNORMAL LOW (ref 3.87–5.11)
RDW: 14.5 % (ref 11.5–15.5)
WBC: 7.2 10*3/uL (ref 4.0–10.5)

## 2018-03-22 LAB — BASIC METABOLIC PANEL
Anion gap: 9 (ref 5–15)
BUN: 12 mg/dL (ref 6–20)
CO2: 29 mmol/L (ref 22–32)
Calcium: 9 mg/dL (ref 8.9–10.3)
Chloride: 91 mmol/L — ABNORMAL LOW (ref 101–111)
Creatinine, Ser: 0.56 mg/dL (ref 0.44–1.00)
GFR calc Af Amer: >60 mL/min (ref >60)
GFR calc non Af Amer: >60 mL/min (ref >60)
Glucose, Bld: 105 mg/dL — ABNORMAL HIGH (ref 65–99)
Potassium: 3.9 mmol/L (ref 3.5–5.1)
Sodium: 129 mmol/L — ABNORMAL LOW (ref 135–145)

## 2018-03-22 LAB — LACTIC ACID, PLASMA
Lactic Acid, Venous: 0.7 mmol/L (ref 0.5–1.9)
Lactic Acid, Venous: 1.2 mmol/L (ref 0.5–1.9)

## 2018-03-22 LAB — SEDIMENTATION RATE: Sed Rate: 45 mm/hr — ABNORMAL HIGH (ref 0–22)

## 2018-03-22 MED ORDER — CYCLOBENZAPRINE HCL 10 MG PO TABS
10.0000 mg | ORAL_TABLET | Freq: Three times a day (TID) | ORAL | Status: DC | PRN
  Administered 2018-03-22 – 2018-03-25 (×4): 10 mg via ORAL
  Filled 2018-03-22 (×4): qty 1

## 2018-03-22 MED ORDER — LEVOTHYROXINE SODIUM 50 MCG PO TABS
50.0000 ug | ORAL_TABLET | Freq: Every day | ORAL | Status: DC
  Administered 2018-03-23 – 2018-03-25 (×3): 50 ug via ORAL
  Filled 2018-03-22 (×3): qty 1

## 2018-03-22 MED ORDER — LUBIPROSTONE 24 MCG PO CAPS
24.0000 ug | ORAL_CAPSULE | Freq: Two times a day (BID) | ORAL | Status: DC
  Administered 2018-03-23 – 2018-03-25 (×5): 24 ug via ORAL
  Filled 2018-03-22 (×5): qty 1

## 2018-03-22 MED ORDER — VANCOMYCIN HCL IN DEXTROSE 1-5 GM/200ML-% IV SOLN
1000.0000 mg | Freq: Once | INTRAVENOUS | Status: AC
  Administered 2018-03-22: 1000 mg via INTRAVENOUS
  Filled 2018-03-22: qty 200

## 2018-03-22 MED ORDER — CALCIUM CARBONATE-VITAMIN D 500-200 MG-UNIT PO TABS
1.0000 | ORAL_TABLET | Freq: Every day | ORAL | Status: DC
  Administered 2018-03-22 – 2018-03-25 (×4): 1 via ORAL
  Filled 2018-03-22 (×4): qty 1

## 2018-03-22 MED ORDER — FERROUS SULFATE 325 (65 FE) MG PO TABS
325.0000 mg | ORAL_TABLET | Freq: Every day | ORAL | Status: DC
  Administered 2018-03-23 – 2018-03-25 (×3): 325 mg via ORAL
  Filled 2018-03-22 (×3): qty 1

## 2018-03-22 MED ORDER — ONDANSETRON HCL 4 MG PO TABS
4.0000 mg | ORAL_TABLET | Freq: Three times a day (TID) | ORAL | Status: DC | PRN
  Filled 2018-03-22: qty 1

## 2018-03-22 MED ORDER — CALCIUM-MAGNESIUM-VITAMIN D ER 600-40-500 MG-MG-UNIT PO TB24: 1.0000 | ORAL_TABLET | Freq: Every day | ORAL | Status: DC

## 2018-03-22 MED ORDER — MAGNESIUM OXIDE 400 (241.3 MG) MG PO TABS
400.0000 mg | ORAL_TABLET | Freq: Every evening | ORAL | Status: DC
  Administered 2018-03-22 – 2018-03-24 (×3): 400 mg via ORAL
  Filled 2018-03-22 (×7): qty 1

## 2018-03-22 MED ORDER — HYDROCODONE-ACETAMINOPHEN 7.5-325 MG PO TABS
1.0000 | ORAL_TABLET | ORAL | Status: DC | PRN
  Administered 2018-03-22 – 2018-03-25 (×10): 1 via ORAL
  Filled 2018-03-22 (×10): qty 1

## 2018-03-22 MED ORDER — SODIUM CHLORIDE 0.9 % IV SOLN
INTRAVENOUS | Status: DC
  Administered 2018-03-22: 19:00:00 via INTRAVENOUS

## 2018-03-22 MED ORDER — IBUPROFEN 400 MG PO TABS
200.0000 mg | ORAL_TABLET | Freq: Four times a day (QID) | ORAL | Status: DC | PRN
  Administered 2018-03-24: 200 mg via ORAL
  Filled 2018-03-22: qty 1

## 2018-03-22 MED ORDER — HEPARIN SODIUM (PORCINE) 5000 UNIT/ML IJ SOLN
5000.0000 [IU] | Freq: Three times a day (TID) | INTRAMUSCULAR | Status: DC
  Administered 2018-03-22 – 2018-03-24 (×7): 5000 [IU] via SUBCUTANEOUS
  Filled 2018-03-22 (×7): qty 1

## 2018-03-22 MED ORDER — SODIUM CHLORIDE 0.9 % IV SOLN
INTRAVENOUS | Status: DC
  Administered 2018-03-22 – 2018-03-23 (×3): via INTRAVENOUS

## 2018-03-22 MED ORDER — UMECLIDINIUM-VILANTEROL 62.5-25 MCG/INH IN AEPB
1.0000 | INHALATION_SPRAY | Freq: Every day | RESPIRATORY_TRACT | Status: DC
  Administered 2018-03-23 – 2018-03-24 (×2): 1 via RESPIRATORY_TRACT
  Filled 2018-03-22: qty 14

## 2018-03-22 MED ORDER — ADULT MULTIVITAMIN W/MINERALS CH
1.0000 | ORAL_TABLET | Freq: Every day | ORAL | Status: DC
  Administered 2018-03-22 – 2018-03-25 (×4): 1 via ORAL
  Filled 2018-03-22 (×4): qty 1

## 2018-03-22 MED ORDER — CITALOPRAM HYDROBROMIDE 20 MG PO TABS
20.0000 mg | ORAL_TABLET | Freq: Every day | ORAL | Status: DC
  Administered 2018-03-22 – 2018-03-24 (×3): 20 mg via ORAL
  Filled 2018-03-22 (×3): qty 1

## 2018-03-22 MED ORDER — ACETAMINOPHEN 325 MG PO TABS: 650.0000 mg | ORAL_TABLET | Freq: Four times a day (QID) | ORAL | Status: DC | PRN

## 2018-03-22 MED ORDER — PANTOPRAZOLE SODIUM 40 MG PO TBEC
40.0000 mg | DELAYED_RELEASE_TABLET | Freq: Every day | ORAL | Status: DC
  Administered 2018-03-22 – 2018-03-25 (×4): 40 mg via ORAL
  Filled 2018-03-22 (×4): qty 1

## 2018-03-22 MED ORDER — ALPRAZOLAM 0.5 MG PO TABS
0.5000 mg | ORAL_TABLET | Freq: Two times a day (BID) | ORAL | Status: DC | PRN
  Administered 2018-03-22 – 2018-03-24 (×3): 0.5 mg via ORAL
  Filled 2018-03-22 (×3): qty 1

## 2018-03-22 NOTE — ED Triage Notes (Addendum)
Pt received knee cap repair surgery on Tuesday from Dr. Aline Brochure. Pt is now having intermittent pain, tingling, and swelling to leg. Leg discoloration noted. Bruising on right foot is normal per patient. Is on home O2 at 2L [place] at all times. O2 level 88-92% on 3L O2

## 2018-03-22 NOTE — H&P (Signed)
[name] is an 77 y.o. female.   Chief Complaint: right knee swelling and redness HPI: 77 yo female s/p orif right patella on 6/11 presented to er with increased swelling and "redness" right knee x 3 days w/o increase in pain  , denied fever or increase in pain associated with swelling in the foot.  Past Medical History:  Diagnosis Date  . Adenocarcinoma of lung ([place]) 12/2010   left lung/surg only  . Allergic rhinitis   . 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 Anxiety disorder   . Aortic aneurysm ([place])   . Complication of anesthesia 2011   bloodpressure dropped during colonoscopy, not problems since  . Compression fracture   . COPD (chronic obstructive pulmonary disease) ([place])   . Depression   . Diastolic dysfunction   . Diverticulosis   . Dysphagia   . Emphysema lung ([place]) 11/08/2014  . Hiatal hernia   . Hypothyroidism   . IBS (irritable bowel syndrome)   . Iron deficiency anemia due to chronic blood loss 12/05/2016  . On home O2 12/15/2013   chronic hypoxia  . PUD (peptic ulcer disease)    72yr  . Shortness of breath    with exertion  . SIADH (syndrome of inappropriate ADH production) ([place]   . Trigeminal neuralgia     Past Surgical History:  Procedure Laterality Date  . ABDOMINAL HYSTERECTOMY    . BACK SURGERY  January 13, 2014  . CATARACT EXTRACTION    . CATARACT EXTRACTION W/PHACO  09/02/2012   Procedure: CATARACT EXTRACTION PHACO AND INTRAOCULAR LENS PLACEMENT (IOC);  Surgeon: MElta GuadeloupeT. SGershon Crane MD;  Location: [facility];  Service: Ophthalmology;  Laterality: Left;  CDE:10.35  . CHOLECYSTECTOMY    . 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 COLONOSCOPY N/A 11/23/2014   Dr. FOneida Alar moderate diverticulosis, hemorrhoids, redundant colon. next TCS in 10-15 years.   .Marland KitchenENDARTERECTOMY Right 08/21/2013   Procedure: RIGHT CAROTID ANEURYSM RESECTION;  Surgeon: TRosetta Posner MD;  [facility]  Service: Vascular;   Laterality: Right;  . ESOPHAGEAL DILATION  02/24/2018   Procedure: ESOPHAGEAL DILATION;  Surgeon: FDanie Binder MD;  [facility];  Service: Endoscopy;;  . ESOPHAGOGASTRODUODENOSCOPY  11/13/2009   w/dilation to 164m gastric ulceration (H.Pylori) s/p treatment  . ESOPHAGOGASTRODUODENOSCOPY  11/21/2009   distal esophageal web, gastritis  . ESOPHAGOGASTRODUODENOSCOPY  03/14/12   [facility]:[id number] the distal esophagus/Mild gastritis/small HH. + H.pylori, prescribed Pylera. Finished treatment.   . ESOPHAGOGASTRODUODENOSCOPY N/A 12/13/2017   Procedure: ESOPHAGOGASTRODUODENOSCOPY (EGD);  Surgeon: FiDanie BinderMD;  [facility];  Service: Endoscopy;  Laterality: N/A;  8:30am  . ESOPHAGOGASTRODUODENOSCOPY N/A 02/24/2018   Procedure: ESOPHAGOGASTRODUODENOSCOPY (EGD);  Surgeon: FiDanie BinderMD;  [facility];  Service: Endoscopy;  Laterality: N/A;  11:00am  . fatty tumor removal from lt groin    . FRACTURE SURGERY Left    hand and left ring finger  . GIVENS CAPSULE STUDY N/A 12/26/2017   Procedure: GIVENS CAPSULE STUDY;  Surgeon: FiDanie BinderMD;  [facility];  Service: Endoscopy;  Laterality: N/A;  7:30am  . GIVENS CAPSULE STUDY N/A 02/24/2018   Procedure: GIVENS CAPSULE STUDY;  Surgeon: FiDanie BinderMD;  [facility];  Service: Endoscopy;  Laterality: N/A;  . KNEE SURGERY     patella tendon repair june 2019 Kendrick Haapala  . LUNG CANCER SURGERY  12/2010   Left VATS, minithoracotomy, LLL superior segmentectomy  . ORIF PATELLA Right 03/18/2018   Procedure: OPEN REDUCTION INTERNAL (ORIF) FIXATION RIGHT PATELLA;  Surgeon: Carole Civil, MD;  Location: [facility];  Service: Orthopedics;  Laterality: Right;  . ORIF WRIST FRACTURE Left 04/09/2014   Procedure: OPEN REDUCTION INTERNAL FIXATION (ORIF) WRIST FRACTURE;  Surgeon: Renette Butters, MD;  Location: [facility];  Service: Orthopedics;  Laterality: Left;  . PERCUTANEOUS PINNING Left 04/09/2014    Procedure: PERCUTANEOUS PINNING EXTREMITY;  Surgeon: Renette Butters, MD;  Location: [facility];  Service: Orthopedics;  Laterality: Left;  . SAVORY DILATION N/A 12/13/2017   Procedure: SAVORY DILATION;  Surgeon: Danie Binder, MD;  [facility];  Service: Endoscopy;  Laterality: N/A;  . TUBAL LIGATION    . vocal cord surgery  02/06/2011   laryngoscopy with bilateral vocal cord Radiesse injection for vocal cord paralysis  . YAG LASER APPLICATION Left 17/51/0258   Procedure: YAG LASER APPLICATION;  Surgeon: Rutherford Guys, MD;  Location: [facility];  Service: Ophthalmology;  Laterality: Left;    Family History  Problem Relation Age of Onset  . Pulmonary embolism Mother   . Heart attack Father   . Hypertension Father   . Liver cancer Sister   . Cancer Sister   . Cancer Brother   . Cancer Daughter   . Colon cancer Neg Hx    Social History:  reports that she quit smoking about 7 years ago. Her smoking use included cigarettes. She has a 10.00 pack-year smoking history. She has never used smokeless tobacco. She reports that she drinks alcohol. She reports that she does not use drugs.  Allergies:  Allergies  Allergen Reactions  . Ciprofloxacin Hcl Hives  . Iohexol Other (See Comments)     Desc: Per [facility], pt is allergic to IV contrast, no type of reaction was available. Pt cannot remember but first reacted around 15 yrs ago from an IVP. Notes were date from 2009 at urology center., Onset Date: [id number]   . Prozac [Fluoxetine Hcl] Rash  . Sulfonamide Derivatives Hives and Rash     (Not in a hospital admission)  Results for orders placed or performed during the hospital encounter of 03/22/18 (from the past 48 hour(s))  Basic metabolic panel     Status: Abnormal   Collection Time: 03/22/18  5:35 PM  Result Value Ref Range   Sodium 129 (L) 135 - 145 mmol/L   Potassium 3.9 3.5 - 5.1 mmol/L   Chloride 91 (L) 101 - 111 mmol/L   CO2 29 22 - 32 mmol/L   Glucose, Bld 105 (H) 65  - 99 mg/dL   BUN 12 6 - 20 mg/dL   Creatinine, Ser 0.56 0.44 - 1.00 mg/dL   Calcium 9.0 8.9 - 10.3 mg/dL   GFR calc non Af Amer >60 >60 mL/min   GFR calc Af Amer >60 >60 mL/min    Comment: (NOTE) The eGFR has been calculated using the CKD EPI equation. This calculation has not been validated in all clinical situations. eGFR's persistently <60 mL/min signify possible Chronic Kidney Disease.    Anion gap 9 5 - 15    Comment: Performed at [facility], [address] [place], [place] [address]  Lactic acid, plasma     Status: None   Collection Time: 03/22/18  5:35 PM  Result Value Ref Range   Lactic Acid, Venous 0.7 0.5 - 1.9 mmol/L    Comment: Performed at [employer] [facility], [address], [place], [place] [address]  CBC with Differential     Status: Abnormal   Collection Time: 03/22/18  5:35 PM  Result Value Ref Range   WBC 7.2 4.0 - 10.5 K/uL   RBC 3.72 (L) 3.87 - 5.11 MIL/uL   Hemoglobin 11.2 (L) 12.0 - 15.0 g/dL   HCT 34.7 (L) 36.0 - 46.0 %   MCV 93.3 78.0 - 100.0 fL   MCH 30.1 26.0 - 34.0 pg   MCHC 32.3 30.0 - 36.0 g/dL   RDW 14.5 11.5 - 15.5 %   Platelets 414 (H) 150 - 400 K/uL   Neutrophils Relative % 78 %   Neutro Abs 5.5 1.7 - 7.7 K/uL   Lymphocytes Relative 13 %   Lymphs Abs 0.9 0.7 - 4.0 K/uL   Monocytes Relative 8 %   Monocytes Absolute 0.6 0.1 - 1.0 K/uL   Eosinophils Relative 1 %   Eosinophils Absolute 0.1 0.0 - 0.7 K/uL   Basophils Relative 0 %   Basophils Absolute 0.0 0.0 - 0.1 K/uL    Comment: Performed at [facility], [address], [place], [place] [address]    ESR 45 5 DAYS POST OP IS EXPECTED  CRP PENDING    Dg Knee Complete 4 Views Right  Result Date: 03/22/2018 CLINICAL DATA:  Patella fracture, swelling and redness EXAM: RIGHT KNEE - COMPLETE 4+ VIEW COMPARISON:  03/06/2018 FINDINGS: Interval cerclage wire and pin fixation of the patella for comminuted slightly separated mid patella fracture. No significant bridging callus. Decreased knee effusion.  Increased pre and infrapatellar soft tissue edema. IMPRESSION: 1. Interval surgical fixation of mid patellar fracture. No significant bridging callus at this time. 2. Large amount of soft tissue swelling about the knee, most prominent over the pre and infrapatellar soft tissues. Minimal foci of supra and infrapatellar soft tissue gas, possibly resolving operative air. Electronically Signed   By: Donavan Foil M.D.   On: 03/22/2018 18:08    Review of Systems  Constitutional: Negative for chills, fever and malaise/fatigue.  Respiratory: Negative for shortness of breath.   Cardiovascular: Negative for chest pain.  Neurological: Negative for dizziness and tingling.  All other systems reviewed and are negative.   Blood pressure 139/86, temperature 98.4 F (36.9 C), temperature source Oral, resp. rate 16, SpO2 94 %. Physical Exam  Constitutional: She is oriented to person, place, and time. She appears well-nourished.  Eyes: Right eye exhibits no discharge. Left eye exhibits no discharge. No scleral icterus.  Neck: Neck supple. No JVD present. No tracheal deviation present.  Cardiovascular: Intact distal pulses.  Respiratory: Effort normal. No stridor.  GI: Soft. She exhibits no distension.  Musculoskeletal:       Legs: RIGHT AND LEFT UPPER EXTREM NORMAL ALIGNMENT ROM STABOLITY AND STRENGTH   LEFT LOWER NORMAL ALIGNMENT NORMAL ROM NORMAL MOTOR AND NORMAL STABILITY    Neurological: She is alert and oriented to person, place, and time. She has normal reflexes. She exhibits normal muscle tone. Coordination normal.  Skin: Skin is warm and dry. No rash noted. No erythema. No pallor.  Psychiatric: She has a normal mood and affect. Her behavior is normal. Thought content normal.     Assessment/Plan Possible cellulitis, looks ok right now, however need to treat to prevent further surgical intervention  [place], MD 03/22/2018, 7:02 PM

## 2018-03-22 NOTE — ED Provider Notes (Signed)
[facility] EMERGENCY DEPARTMENT Provider Note   CSN: [id number] Arrival date & time: 03/22/18  1610     History   Chief Complaint Chief Complaint  Patient presents with  . Post-op Problem    HPI [name] is a 77 y.o. female.  HPI  Pt was seen at 1710. Per pt, c/o gradual onset and worsening of persistent right knee "swelling" and "redness" over the past 4 days. Pt s/p right patellar fx repair on 03/18/2018. Pt states she has been wearing her knee immobilizer and elevating her leg as instructed. Denies fevers, no focal motor weakness, no new injury.   Past Medical History:  Diagnosis Date  . Adenocarcinoma of lung ([place]) 12/2010   left lung/surg only  . Allergic rhinitis   . 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 Anxiety disorder   . Aortic aneurysm ([place])   . Complication of anesthesia 2011   bloodpressure dropped during colonoscopy, not problems since  . Compression fracture   . COPD (chronic obstructive pulmonary disease) ([place])   . Depression   . Diastolic dysfunction   . Diverticulosis   . Dysphagia   . Emphysema lung ([place]) 11/08/2014  . Hiatal hernia   . Hypothyroidism   . IBS (irritable bowel syndrome)   . Iron deficiency anemia due to chronic blood loss 12/05/2016  . On home O2 12/15/2013   chronic hypoxia  . PUD (peptic ulcer disease)    25yrs  . Shortness of breath    with exertion  . SIADH (syndrome of inappropriate ADH production) ([place])   . Trigeminal neuralgia     Patient Active Problem List   Diagnosis Date Noted  . Cellulitis, wound, post-operative 03/22/2018  . Fracture of right patella   . Normocytic anemia   . Esophageal dysphagia 11/27/2017  . Heme positive stool 11/27/2017  . Iron deficiency anemia 12/05/2016  . SIADH (syndrome of inappropriate ADH production) ([place])   . Polypharmacy 09/14/2016  . Muscle weakness (generalized)   . Syncope 09/10/2016  . Rib pain on left side   . Hypersomnia 02/16/2016  .  Acute bronchitis 02/16/2016  . Exertional dyspnea 12/20/2015  . HLD (hyperlipidemia) 11/23/2014  . Change in bowel habits 11/10/2014  . Abdominal pain, left lower quadrant 11/10/2014  . Emphysema lung ([place]) 11/08/2014  . Helicobacter pylori gastritis 07/30/2014  . Acute on chronic respiratory failure with hypoxia ([place]) 04/10/2014  . Wrist fracture 04/09/2014  . 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 T12 burst fracture ([place]) 01/13/2014  . Constipation 12/14/2013  . Acute respiratory failure with hypoxia ([place]) 12/14/2013  . Hyponatremia 12/14/2013  . COPD (chronic obstructive pulmonary disease) ([place]) 12/14/2013  . Compression fracture of thoracic vertebra ([place]) 12/14/2013  . Hypoxia 12/13/2013  . Compression fracture 12/13/2013  . Diastolic dysfunction   . 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 Dyspepsia 02/11/2012  . Aneurysm ([place]) 02/11/2012  . Trigeminal neuralgia   . Epigastric pain 02/21/2011  . Dysphagia 02/21/2011  . Adenocarcinoma of lung ([place]) 12/07/2010  . HELICOBACTER PYLORI GASTRITIS, HX OF 12/30/2009  . CONTRAST DYE ALLERGY 12/30/2009    Past Surgical History:  Procedure Laterality Date  . ABDOMINAL HYSTERECTOMY    . BACK SURGERY  January 13, 2014  . CATARACT EXTRACTION    . CATARACT EXTRACTION W/PHACO  09/02/2012   Procedure: CATARACT EXTRACTION PHACO AND INTRAOCULAR LENS PLACEMENT (  [place]);  Surgeon: Elta Guadeloupe T. Gershon Crane, MD;  Location: [facility];  Service: Ophthalmology;  Laterality: Left;  CDE:10.35  . CHOLECYSTECTOMY    . 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 COLONOSCOPY N/A 11/23/2014   Dr. Oneida Alar: moderate diverticulosis, hemorrhoids, redundant colon. next TCS in 10-15 years.   Marland Kitchen ENDARTERECTOMY Right 08/21/2013   Procedure: RIGHT CAROTID ANEURYSM RESECTION;  Surgeon: Rosetta Posner, MD;  Location: [place];  Service:  Vascular;  Laterality: Right;  . ESOPHAGEAL DILATION  02/24/2018   Procedure: ESOPHAGEAL DILATION;  Surgeon: Danie Binder, MD;  Location: [facility];  Service: Endoscopy;;  . ESOPHAGOGASTRODUODENOSCOPY  11/13/2009   w/dilation to 48mm, gastric ulceration (H.Pylori) s/p treatment  . ESOPHAGOGASTRODUODENOSCOPY  11/21/2009   distal esophageal web, gastritis  . ESOPHAGOGASTRODUODENOSCOPY  03/14/12   UDJ:SHFWYOVZC in the distal esophagus/Mild gastritis/small HH. + H.pylori, prescribed Pylera. Finished treatment.   . ESOPHAGOGASTRODUODENOSCOPY N/A 12/13/2017   Procedure: ESOPHAGOGASTRODUODENOSCOPY (EGD);  Surgeon: Danie Binder, MD;  Location[facility];  Service: Endoscopy;  Laterality: N/A;  8:30am  . ESOPHAGOGASTRODUODENOSCOPY N/A 02/24/2018   Procedure: ESOPHAGOGASTRODUODENOSCOPY (EGD);  Surgeon: Danie Binder, MD;  [facility];  Service: Endoscopy;  Laterality: N/A;  11:00am  . fatty tumor removal from lt groin    . FRACTURE SURGERY Left    hand and left ring finger  . GIVENS CAPSULE STUDY N/A 12/26/2017   Procedure: GIVENS CAPSULE STUDY;  Surgeon: Danie Binder, MD;  [facility];  Service: Endoscopy;  Laterality: N/A;  7:30am  . GIVENS CAPSULE STUDY N/A 02/24/2018   Procedure: GIVENS CAPSULE STUDY;  Surgeon: Danie Binder, MD;  [facility];  Service: Endoscopy;  Laterality: N/A;  . KNEE SURGERY     patella tendon repair june 2019 [place]  . LUNG CANCER SURGERY  12/2010   Left VATS, minithoracotomy, LLL superior segmentectomy  . ORIF PATELLA Right 03/18/2018   Procedure: OPEN REDUCTION INTERNAL (ORIF) FIXATION RIGHT PATELLA;  Surgeon: Carole Civil, MD;  Location: [facility];  Service: Orthopedics;  Laterality: Right;  . ORIF WRIST FRACTURE Left 04/09/2014   Procedure: OPEN REDUCTION INTERNAL FIXATION (ORIF) WRIST FRACTURE;  Surgeon: Renette Butters, MD;  Location: [place];  Service: Orthopedics;  Laterality: Left;  . PERCUTANEOUS PINNING Left  04/09/2014   Procedure: PERCUTANEOUS PINNING EXTREMITY;  Surgeon: Renette Butters, MD;  Location: [place];  Service: Orthopedics;  Laterality: Left;  . SAVORY DILATION N/A 12/13/2017   Procedure: SAVORY DILATION;  Surgeon: Danie Binder, MD;  [facility];  Service: Endoscopy;  Laterality: N/A;  . TUBAL LIGATION    . vocal cord surgery  02/06/2011   laryngoscopy with bilateral vocal cord Radiesse injection for vocal cord paralysis  . YAG LASER APPLICATION Left 58/85/0277   Procedure: YAG LASER APPLICATION;  Surgeon: Rutherford Guys, MD;  Location: [facility];  Service: Ophthalmology;  Laterality: Left;     OB History    Gravida  2   Para  2   Term  2   Preterm      AB      Living  2     SAB      TAB      Ectopic      Multiple      Live Births               Home Medications    Prior to Admission medications  Medication Sig Start Date End Date Taking? Authorizing Provider  ALPRAZolam Duanne Moron) 0.5 MG tablet Take 1 tablet (0.5 mg total) by mouth 2 (two) times daily as needed for anxiety. Patient taking differently: Take 0.5 mg by mouth at bedtime.  01/17/18  Yes [name], MD  Calcium-Magnesium-Vitamin D (CITRACAL CALCIUM+D) 600-40-500 MG-MG-UNIT TB24 Take 1 tablet by mouth daily.   Yes [provider]  citalopram (CELEXA) 40 MG tablet Take 0.5 tablets (20 mg total) by mouth at bedtime. 01/17/18  Yes [name], MD  ferrous sulfate 325 (65 FE) MG tablet Take 325 mg by mouth daily with breakfast.   Yes [provider]  HYDROcodone-acetaminophen (NORCO) 7.5-325 MG tablet Take 1 tablet by mouth every 4 (four) hours as needed for up to 7 days for moderate pain. 03/18/18 03/25/18 Yes Carole Civil, MD  ibuprofen (ADVIL,MOTRIN) 200 MG tablet Take 200 mg by mouth every 6 (six) hours as needed for headache or mild pain.   Yes [provider]  levothyroxine (SYNTHROID, LEVOTHROID) 50 MCG tablet Take 1 tablet (50 mcg total) by mouth  daily before breakfast. 01/17/18  Yes Pickard, Cammie Mcgee, MD  lubiprostone (AMITIZA) 24 MCG capsule Take 1 capsule (24 mcg total) by mouth 2 (two) times daily with a meal. 02/28/18  Yes [name], MD  magnesium oxide (MAG-OX) 400 MG tablet Take 400 mg by mouth every evening.    Yes [provider]  Multiple Vitamin (MULTIVITAMIN WITH MINERALS) TABS tablet Take 1 tablet by mouth daily.   Yes [provider]  omeprazole (PRILOSEC) 20 MG capsule 1 PO 30 MINS PRIOR TO BREAKFAST. Patient taking differently: Take 20 mg by mouth daily.  12/13/17  Yes Fields, Sandi L, MD  ondansetron (ZOFRAN) 4 MG tablet Take 1 tablet (4 mg total) by mouth 3 (three) times daily as needed for nausea or vomiting. 11/14/16  Yes [name], MD  umeclidinium-vilanterol (ANORO ELLIPTA) 62.5-25 MCG/INH AEPB Inhale 1 puff into the lungs daily. 04/25/17  Yes [name], MD  acetaminophen (TYLENOL) 325 MG tablet Take 650 mg by mouth every 6 (six) hours as needed for moderate pain or headache.     [provider]  cyclobenzaprine (FLEXERIL) 10 MG tablet Take 1 tablet (10 mg total) by mouth every 8 (eight) hours as needed for muscle spasms. 03/20/18   Carole Civil, MD    Family History Family History  Problem Relation Age of Onset  . Pulmonary embolism Mother   . Heart attack Father   . Hypertension Father   . Liver cancer Sister   . Cancer Sister   . Cancer Brother   . Cancer Daughter   . Colon cancer Neg Hx     Social History Social History   Tobacco Use  . Smoking status: Former Smoker    Packs/day: 0.50    Years: 20.00    Pack years: 10.00    Types: Cigarettes    Last attempt to quit: 12/21/2010    Years since quitting: 7.2  . Smokeless tobacco: Never Used  . Tobacco comment: smoking cessation info given and reviewed   Substance Use Topics  . Alcohol use: Yes    Alcohol/week: 0.0 oz    Comment: seldom  . Drug use: No     Allergies   Ciprofloxacin hcl;  Iohexol; Prozac [fluoxetine hcl]; and Sulfonamide derivatives   Review of Systems Review of Systems ROS: Statement: All systems negative except as marked or noted in the  HPI; Constitutional: Negative for fever and chills. ; ; Eyes: Negative for eye pain, redness and discharge. ; ; ENMT: Negative for ear pain, hoarseness, nasal congestion, sinus pressure and sore throat. ; ; Cardiovascular: Negative for chest pain, palpitations, diaphoresis, dyspnea and peripheral edema. ; ; Respiratory: Negative for cough, wheezing and stridor. ; ; Gastrointestinal: Negative for nausea, vomiting, diarrhea, abdominal pain, blood in stool, hematemesis, jaundice and rectal bleeding. . ; ; Genitourinary: Negative for dysuria, flank pain and hematuria. ; ; Musculoskeletal: +swelling and redness right knee. Negative for back pain and neck pain. Negative for deformity.; ; Skin: Negative for pruritus, abrasions, blisters, bruising and skin lesion.; ; Neuro: Negative for headache, lightheadedness and neck stiffness. Negative for weakness, altered level of consciousness, altered mental status, extremity weakness, paresthesias, involuntary movement, seizure and syncope.       Physical Exam Updated Vital Signs BP 139/86   Temp 98.4 F (36.9 C) (Oral)   Resp 16   SpO2 94%   Physical Exam 1715: Physical examination:  Nursing notes reviewed; Vital signs and O2 SAT reviewed;  Constitutional: Well developed, Well nourished, Well hydrated, In no acute distress; Head:  Normocephalic, atraumatic; Eyes: EOMI, PERRL, No scleral icterus; ENMT: Mouth and pharynx normal, Mucous membranes moist; Neck: Supple, Full range of motion, No lymphadenopathy; Cardiovascular: Regular rate and rhythm, No gallop; Respiratory: Breath sounds clear & equal bilaterally, No wheezes.  Speaking full sentences with ease, Normal respiratory effort/excursion; Chest: Nontender, Movement normal; Abdomen: Soft, Nontender, Nondistended, Normal bowel sounds;  Genitourinary: No CVA tenderness; Extremities: Peripheral pulses normal, No tenderness, +warmth, erythema and localized edema to right patellar area, no streaking. +1 edema to right foot/ankle with ecchymosis to toes. NT right ankle/foot. Muscles compartments soft..; Neuro: AA&Ox3, Major CN grossly intact.  Speech clear. No gross focal motor or sensory deficits in extremities.; Skin: Color normal, Warm, Dry.   ED Treatments / Results  Labs (all labs ordered are listed, but only abnormal results are displayed)   EKG None  Radiology   Procedures Procedures (including critical care time)  Medications Ordered in ED Medications  vancomycin (VANCOCIN) IVPB 1000 mg/200 mL premix (has no administration in time range)  0.9 %  sodium chloride infusion (has no administration in time range)  heparin injection 5,000 Units (has no administration in time range)  0.9 %  sodium chloride infusion (has no administration in time range)     Initial Impression / Assessment and Plan / ED Course  I have reviewed the triage vital signs and the nursing notes.  Pertinent labs & imaging results that were available during my care of the patient were reviewed by me and considered in my medical decision making (see chart for details).  MDM Reviewed: previous chart, nursing note and vitals Reviewed previous: labs Interpretation: labs and x-ray Total time providing critical care: 30-74 minutes. This excludes time spent performing separately reportable procedures and services. Consults: orthopedics   CRITICAL CARE Performed by: Alfonzo Feller Total critical care time: 35 minutes Critical care time was exclusive of separately billable procedures and treating other patients. Critical care was necessary to treat or prevent imminent or life-threatening deterioration. Critical care was time spent personally by me on the following activities: development of treatment plan with patient and/or surrogate as  well as nursing, discussions with consultants, evaluation of patient's response to treatment, examination of patient, obtaining history from patient or surrogate, ordering and performing treatments and interventions, ordering and review of laboratory studies, ordering and review of radiographic studies, pulse  oximetry and re-evaluation of patient's condition.   Results for orders placed or performed during the hospital encounter of 45/85/92  Basic metabolic panel  Result Value Ref Range   Sodium 129 (L) 135 - 145 mmol/L   Potassium 3.9 3.5 - 5.1 mmol/L   Chloride 91 (L) 101 - 111 mmol/L   CO2 29 22 - 32 mmol/L   Glucose, Bld 105 (H) 65 - 99 mg/dL   BUN 12 6 - 20 mg/dL   Creatinine, Ser 0.56 0.44 - 1.00 mg/dL   Calcium 9.0 8.9 - 10.3 mg/dL   GFR calc non Af Amer >60 >60 mL/min   GFR calc Af Amer >60 >60 mL/min   Anion gap 9 5 - 15  Lactic acid, plasma  Result Value Ref Range   Lactic Acid, Venous 0.7 0.5 - 1.9 mmol/L  CBC with Differential  Result Value Ref Range   WBC 7.2 4.0 - 10.5 K/uL   RBC 3.72 (L) 3.87 - 5.11 MIL/uL   Hemoglobin 11.2 (L) 12.0 - 15.0 g/dL   HCT 34.7 (L) 36.0 - 46.0 %   MCV 93.3 78.0 - 100.0 fL   MCH 30.1 26.0 - 34.0 pg   MCHC 32.3 30.0 - 36.0 g/dL   RDW 14.5 11.5 - 15.5 %   Platelets 414 (H) 150 - 400 K/uL   Neutrophils Relative % 78 %   Neutro Abs 5.5 1.7 - 7.7 K/uL   Lymphocytes Relative 13 %   Lymphs Abs 0.9 0.7 - 4.0 K/uL   Monocytes Relative 8 %   Monocytes Absolute 0.6 0.1 - 1.0 K/uL   Eosinophils Relative 1 %   Eosinophils Absolute 0.1 0.0 - 0.7 K/uL   Basophils Relative 0 %   Basophils Absolute 0.0 0.0 - 0.1 K/uL    Dg Knee Complete 4 Views Right Result Date: 03/22/2018 CLINICAL DATA:  Patella fracture, swelling and redness EXAM: RIGHT KNEE - COMPLETE 4+ VIEW COMPARISON:  03/06/2018 FINDINGS: Interval cerclage wire and pin fixation of the patella for comminuted slightly separated mid patella fracture. No significant bridging callus. Decreased  knee effusion. Increased pre and infrapatellar soft tissue edema. IMPRESSION: 1. Interval surgical fixation of mid patellar fracture. No significant bridging callus at this time. 2. Large amount of soft tissue swelling about the knee, most prominent over the pre and infrapatellar soft tissues. Minimal foci of supra and infrapatellar soft tissue gas, possibly resolving operative air. Electronically Signed   By: Donavan Foil M.D.   On: 03/22/2018 18:08    1850:  Recurrent hyponatremia on labs; judicious IVF started. IV vancomycin ordered to tx cellulitis. T/C returned from Ortho Dr. Aline Brochure, case discussed, including:  HPI, pertinent PM/SHx, VS/PE, dx testing, ED course and treatment:  Agreeable to admit.       Final Clinical Impressions(s) / ED Diagnoses   Final diagnoses:  Cellulitis, wound, post-operative  Hyponatremia    ED Discharge Orders    None       Francine Graven, DO 03/27/18 1548

## 2018-03-23 LAB — SURGICAL PCR SCREEN
MRSA, PCR: NEGATIVE
Staphylococcus aureus: NEGATIVE

## 2018-03-23 LAB — C-REACTIVE PROTEIN: CRP: 8.3 mg/dL — ABNORMAL HIGH (ref <1.0)

## 2018-03-23 MED ORDER — VANCOMYCIN HCL IN DEXTROSE 1-5 GM/200ML-% IV SOLN
1000.0000 mg | Freq: Two times a day (BID) | INTRAVENOUS | Status: DC
  Administered 2018-03-23 (×2): 1000 mg via INTRAVENOUS
  Filled 2018-03-23 (×3): qty 200

## 2018-03-24 ENCOUNTER — Encounter: Payer: Self-pay | Admitting: Orthopedic Surgery

## 2018-03-24 LAB — CBC
HCT: 32.7 % — ABNORMAL LOW (ref 36.0–46.0)
Hemoglobin: 10.4 g/dL — ABNORMAL LOW (ref 12.0–15.0)
MCH: 29.8 pg (ref 26.0–34.0)
MCHC: 31.8 g/dL (ref 30.0–36.0)
MCV: 93.7 fL (ref 78.0–100.0)
Platelets: 417 10*3/uL — ABNORMAL HIGH (ref 150–400)
RBC: 3.49 MIL/uL — ABNORMAL LOW (ref 3.87–5.11)
RDW: 14.5 % (ref 11.5–15.5)
WBC: 4.6 10*3/uL (ref 4.0–10.5)

## 2018-03-24 LAB — BASIC METABOLIC PANEL
Anion gap: 6 (ref 5–15)
BUN: 7 mg/dL (ref 6–20)
CO2: 32 mmol/L (ref 22–32)
Calcium: 8.8 mg/dL — ABNORMAL LOW (ref 8.9–10.3)
Chloride: 99 mmol/L — ABNORMAL LOW (ref 101–111)
Creatinine, Ser: 0.42 mg/dL — ABNORMAL LOW (ref 0.44–1.00)
GFR calc Af Amer: >60 mL/min (ref >60)
GFR calc non Af Amer: >60 mL/min (ref >60)
Glucose, Bld: 93 mg/dL (ref 65–99)
Potassium: 4.5 mmol/L (ref 3.5–5.1)
Sodium: 137 mmol/L (ref 135–145)

## 2018-03-24 MED ORDER — VANCOMYCIN HCL IN DEXTROSE 1-5 GM/200ML-% IV SOLN
1000.0000 mg | INTRAVENOUS | Status: DC
  Administered 2018-03-24: 1000 mg via INTRAVENOUS
  Filled 2018-03-24: qty 200

## 2018-03-24 NOTE — Plan of Care (Signed)
progressing 

## 2018-03-24 NOTE — Evaluation (Signed)
Physical Therapy Evaluation Patient Details Name: [name] MRN: [id number] DOB: 1941-01-12 Today's Date: 03/24/2018   History of Present Illness  [name] is an 77 y.o. female,  s/p orif right patella on 6/11 presented to er with increased swelling and "redness" right knee x 3 days w/o increase in pain  , denied fever or increase in pain associated with swelling in the foot.    Clinical Impression  Patient limited to a few steps at bedside mostly due to c/o lightheadedness while standing, has difficulty advancing RLE due to weakness and fair/poor standing balance, had to shuffle on LLE to prevent putting body weight on RLE to prevent right knee pain.  Patient tolerated sitting up in chair with BLE elevated after therapy.  Patient will benefit from continued physical therapy in hospital and recommended venue below to increase strength, balance, endurance for safe ADLs and gait.    Follow Up Recommendations Home health PT;Supervision - Intermittent    Equipment Recommendations  None recommended by PT    Recommendations for Other Services       Precautions / Restrictions Precautions Precautions: Fall Required Braces or Orthoses: Knee Immobilizer - Right Knee Immobilizer - Right: On when out of bed or walking Restrictions Weight Bearing Restrictions: Yes RLE Weight Bearing: Weight bearing as tolerated      Mobility  Bed Mobility Overal bed mobility: Needs Assistance Bed Mobility: Supine to Sit     Supine to sit: Supervision     General bed mobility comments: with head of bed raised  Transfers Overall transfer level: Needs assistance Equipment used: Rolling walker (2 wheeled) Transfers: Sit to/from [facility] Sit to Stand: Supervision Stand pivot transfers: Min guard          Ambulation/Gait Ambulation/Gait assistance: Herbalist (Feet): 5 Feet Assistive device: Rolling walker (2 wheeled) Gait Pattern/deviations: Decreased  step length - right;Decreased stance time - right;Decreased stride length Gait velocity: slow   General Gait Details: patient limited to 8-9 labord short steps with mostly dragging RLE due to weakness, limited mostly due to c/o lightheadedness when standing  Stairs            Wheelchair Mobility    Modified Rankin (Stroke Patients Only)       Balance Overall balance assessment: Needs assistance Sitting-balance support: Feet supported;No upper extremity supported Sitting balance-Leahy Scale: Good     Standing balance support: Bilateral upper extremity supported;During functional activity Standing balance-Leahy Scale: Fair                               Pertinent Vitals/Pain Pain Assessment: No/denies pain    Home Living Family/patient expects to be discharged to:: Private residence Living Arrangements: Alone Available Help at Discharge: Family Type of Home: Apartment Home Access: Ramped entrance;Stairs to enter Entrance Stairs-Rails: None Entrance Stairs-Number of Steps: 1 Home Layout: One level Home Equipment: [place] - 4 wheels;Cane - single point;Shower seat;Wheelchair - manual Additional Comments: manual wheelchair is borrowed    Prior Function Level of Independence: Independent with assistive device(s)         Comments: household gait with RW     Hand Dominance        Extremity/Trunk Assessment   Upper Extremity Assessment Upper Extremity Assessment: Generalized weakness    Lower Extremity Assessment Lower Extremity Assessment: Generalized weakness;RLE deficits/detail;LLE deficits/detail RLE Deficits / Details: grossly -3/5 LLE Deficits / Details: grossly 4+/5  Cervical / Trunk Assessment Cervical / Trunk Assessment: Normal  Communication   Communication: No difficulties  Cognition Arousal/Alertness: Awake/alert Behavior During Therapy: WFL for tasks assessed/performed Overall Cognitive Status: Within Functional Limits for  tasks assessed                                        General Comments      Exercises     Assessment/Plan    PT Assessment Patient needs continued PT services  PT Problem List Decreased strength;Decreased activity tolerance;Decreased balance;Decreased mobility;Decreased range of motion       PT Treatment Interventions Gait training;Stair training;Functional mobility training;Therapeutic activities;Therapeutic exercise;Patient/family education    PT Goals (Current goals can be found in the Care Plan section)  Acute Rehab PT Goals Patient Stated Goal: return home with neighbors to assist PT Goal Formulation: With patient Time For Goal Achievement: 03/31/18 Potential to Achieve Goals: Good    Frequency Min 3X/week   Barriers to discharge        Co-evaluation               AM-PAC PT "6 Clicks" Daily Activity  Outcome Measure Difficulty turning over in bed (including adjusting bedclothes, sheets and blankets)?: None Difficulty moving from lying on back to sitting on the side of the bed? : A Little Difficulty sitting down on and standing up from a chair with arms (e.g., wheelchair, bedside commode, etc,.)?: A Little Help needed moving to and from a bed to chair (including a wheelchair)?: A Little Help needed walking in hospital room?: A Little Help needed climbing 3-5 steps with a railing? : A Lot 6 Click Score: 18    End of Session   Activity Tolerance: Patient tolerated treatment well;Patient limited by fatigue Patient left: in chair;with call bell/phone within reach Nurse Communication: Mobility status PT Visit Diagnosis: Unsteadiness on feet (R26.81);Other abnormalities of gait and mobility (R26.89);Muscle weakness (generalized) (M62.81)    Time: 0981-1914 PT Time Calculation (min) (ACUTE ONLY): 24 min   Charges:   PT Evaluation $PT Eval Moderate Complexity: 1 Mod PT Treatments $Therapeutic Activity: 23-37 mins   PT G Codes:         2:59 PM, 04/14/18 Lonell Grandchild, MPT Physical Therapist with [facility] [phone] office [phone] mobile phone

## 2018-03-24 NOTE — Plan of Care (Signed)
  Problem: Acute Rehab PT Goals(only PT should resolve) Goal: Pt Will Go Supine/Side To Sit Outcome: Progressing Flowsheets (Taken 03/24/2018 1507) Pt will go Supine/Side to Sit: with modified independence Goal: Patient Will Transfer Sit To/From Stand Outcome: Progressing Flowsheets (Taken 03/24/2018 1507) Patient will transfer sit to/from stand: with supervision Goal: Pt Will Transfer Bed To Chair/Chair To Bed Outcome: Progressing Flowsheets (Taken 03/24/2018 1507) Pt will Transfer Bed to Chair/Chair to Bed: with supervision Goal: Pt Will Ambulate Outcome: Progressing Flowsheets (Taken 03/24/2018 1507) Pt will Ambulate: 25 feet;with supervision;with rolling walker   3:08 PM, 03/24/18 Lonell Grandchild, MPT Physical Therapist with [facility] [phone] office [phone] mobile phone.

## 2018-03-24 NOTE — Progress Notes (Signed)
Patient ID: [name], female   DOB: 1940-10-16, 77 y.o.   MRN: [id number] 03/22/2018 date of admission  Hospital day 2  Vancomycin day 2  Diagnosis cellulitis right knee Diagnosis hyponatremia  Initial sed rate was 45 CRP was 8  BP 124/69 (BP Location: Left Arm)   Pulse 75   Temp 98.6 F (37 C) (Oral)   Resp 17   Ht 5\' 5"  (1.651 m)   Wt 116 lb 10 oz (52.9 kg)   SpO2 100%   BMI 19.41 kg/m   White count and repeat sed rate CRP and creatinine pending (creatinine normal)  Patient feels good cellulitis is improving  Recommend ambulation today and discharge tomorrow, spoke with pharmacy and recommend 1 dose per day

## 2018-03-24 NOTE — Care Management Important Message (Signed)
Important Message  Patient Details  Name: KARIYA LAVERGNE MRN: [id number] Date of Birth: [birth year]   Medicare Important Message Given:  Yes    Shelda Altes 03/24/2018, 11:37 AM

## 2018-03-24 NOTE — Progress Notes (Signed)
Pharmacy Antibiotic Note  [name] is a 77 y.o. female admitted on 03/22/2018 with cellulitis.  Pharmacy has been consulted for Vancomycin dosing.  Originally on 1gm IV every 12 hours.  Spoke with Dr. Aline Brochure will adjust dose based upon patient parameters.  Plan: Vancomycin 1gm IV every 24 hours.  Goal trough 10-15 mcg/mL. Monitor labs, micro and vitals.   Height: 5\' 5"  (165.1 cm) Weight: 116 lb 10 oz (52.9 kg) IBW/kg (Calculated) : 57  Temp (24hrs), Avg:98.6 F (37 C), Min:98.4 F (36.9 C), Max:98.7 F (37.1 C)  Recent Labs  Lab 03/22/18 1735 03/22/18 1915 03/24/18 0634  WBC 7.2  --   --   CREATININE 0.56  --  0.42*  LATICACIDVEN 0.7 1.2  --     Estimated Creatinine Clearance: 50 mL/min (A) (by C-G formula based on SCr of 0.42 mg/dL (L)).    Allergies  Allergen Reactions  . Ciprofloxacin Hcl Hives  . Iohexol Other (See Comments)     Desc: Per [facility], pt is allergic to IV contrast, no type of reaction was available. Pt cannot remember but first reacted around 15 yrs ago from an IVP. Notes were date from 2009 at urology center., Onset Date: [id number]   . Prozac [Fluoxetine Hcl] Rash  . Sulfonamide Derivatives Hives and Rash   Antimicrobials this admission: Vanc 6/15 >>    >>   Dose adjustments this admission: 6/17 changed to 1gm IV every 24 hours.  Microbiology results:  BCx:   UCx:    Sputum:   6/15 MRSA PCR: (-)  Thank you for allowing pharmacy to be a part of this patient's care.  Pricilla Larsson 03/24/2018 8:16 AM

## 2018-03-25 DIAGNOSIS — E871 Hypo-osmolality and hyponatremia: Secondary | ICD-10-CM

## 2018-03-25 LAB — BASIC METABOLIC PANEL
Anion gap: 6 (ref 5–15)
BUN: 6 mg/dL (ref 6–20)
CO2: 31 mmol/L (ref 22–32)
Calcium: 8.8 mg/dL — ABNORMAL LOW (ref 8.9–10.3)
Chloride: 99 mmol/L — ABNORMAL LOW (ref 101–111)
Creatinine, Ser: 0.5 mg/dL (ref 0.44–1.00)
GFR calc Af Amer: >60 mL/min (ref >60)
GFR calc non Af Amer: >60 mL/min (ref >60)
Glucose, Bld: 84 mg/dL (ref 65–99)
Potassium: 4.2 mmol/L (ref 3.5–5.1)
Sodium: 136 mmol/L (ref 135–145)

## 2018-03-25 LAB — C-REACTIVE PROTEIN: CRP: 3.3 mg/dL — ABNORMAL HIGH (ref <1.0)

## 2018-03-25 MED ORDER — HYDROCODONE-ACETAMINOPHEN 7.5-325 MG PO TABS: 1.0000 | ORAL_TABLET | ORAL | 0 refills | Status: AC | PRN

## 2018-03-25 MED ORDER — CEPHALEXIN 250 MG PO CAPS: 250.0000 mg | ORAL_CAPSULE | Freq: Four times a day (QID) | ORAL | 1 refills | Status: AC

## 2018-03-25 NOTE — Discharge Instructions (Signed)
Wear knee brace all the time except to bathe  Do not bend the knee  Weight bearing in brace as tolerated with walker

## 2018-03-25 NOTE — Progress Notes (Signed)
Removed IV-clean, dry, intact. Reviewed d/c paperwork with patient. Reviewed new medications and where to pick up. Answered all questions. Josh the NT wheeled stable patient and belongings including an oxygen tank to main entrance where she was picked up by friend.

## 2018-03-25 NOTE — Plan of Care (Signed)
progressing 

## 2018-03-25 NOTE — Discharge Summary (Addendum)
Discharge summary  03/22/2018 admitting date  03/25/2018 discharge date  Admit diagnosis cellulitis right knee and hyponatremia  Discharge diagnosis same  Procedures none  History 77 year old female had right knee patella fracture with treatment of open treatment internal fixation presented to the ER several days later with cellulitis and hyponatremia she was admitted for correction of her hyponatremia and IV antibiotics for her cellulitis  She was afebrile her white count was 7 she did have an elevated sed rate and C-reactive protein of 45 and 8.3 she was started on IV vancomycin which she continued until discharge.  She made significant improvements never had a fever white count was never higher than 7  She will be discharged to home  She is weightbearing as tolerated  BP 107/75 (BP Location: Right Arm)   Pulse 82   Temp 97.9 F (36.6 C) (Oral)   Resp 18   Ht 5\' 5"  (1.651 m)   Wt 116 lb 10 oz (52.9 kg)   SpO2 96%   BMI 19.41 kg/m   Today's exam: Her wound was clean dry and intact her cellulitis is all but resolved she is neurovascularly intact negative Homans sign   CBC Latest Ref Rng & Units 03/24/2018 03/22/2018 03/18/2018  WBC 4.0 - 10.5 K/uL 4.6 7.2 -  Hemoglobin 12.0 - 15.0 g/dL 10.4(L) 11.2(L) 13.3  Hematocrit 36.0 - 46.0 % 32.7(L) 34.7(L) 39.0  Platelets 150 - 400 K/uL 417(H) 414(H) -   BMP Latest Ref Rng & Units 03/25/2018 03/24/2018 03/22/2018  Glucose 65 - 99 mg/dL 84 93 105(H)  BUN 6 - 20 mg/dL 6 7 12   Creatinine 0.44 - 1.00 mg/dL 0.50 0.42(L) 0.56  Sodium 135 - 145 mmol/L 136 137 129(L)  Potassium 3.5 - 5.1 mmol/L 4.2 4.5 3.9  Chloride 101 - 111 mmol/L 99(L) 99(L) 91(L)  CO2 22 - 32 mmol/L 31 32 29  Calcium 8.9 - 10.3 mg/dL 8.8(L) 8.8(L) 9.0    She will be discharged with the following medications:  Allergies as of 03/25/2018      Reactions   Ciprofloxacin Hcl Hives   Iohexol Other (See Comments)    Desc: Per [facility], pt is allergic to IV  contrast, no type of reaction was available. Pt cannot remember but first reacted around 15 yrs ago from an IVP. Notes were date from 2009 at urology center., Onset Date: [id number]   Prozac [fluoxetine Hcl] Rash   Sulfonamide Derivatives Hives, Rash      Medication List    TAKE these medications   acetaminophen 325 MG tablet Commonly known as:  TYLENOL Take 650 mg by mouth every 6 (six) hours as needed for moderate pain or headache.   ALPRAZolam 0.5 MG tablet Commonly known as:  XANAX Take 1 tablet (0.5 mg total) by mouth 2 (two) times daily as needed for anxiety. What changed:  when to take this   cephALEXin 250 MG capsule Commonly known as:  KEFLEX Take 1 capsule (250 mg total) by mouth 4 (four) times daily for 7 days.   citalopram 40 MG tablet Commonly known as:  CELEXA Take 0.5 tablets (20 mg total) by mouth at bedtime.   CITRACAL CALCIUM+D 600-40-500 MG-MG-UNIT Tb24 Generic drug:  Calcium-Magnesium-Vitamin D Take 1 tablet by mouth daily.   cyclobenzaprine 10 MG tablet Commonly known as:  FLEXERIL Take 1 tablet (10 mg total) by mouth every 8 (eight) hours as needed for muscle spasms.   ferrous sulfate 325 (65 FE) MG tablet Take 325 mg by  mouth daily with breakfast.   HYDROcodone-acetaminophen 7.5-325 MG tablet Commonly known as:  NORCO Take 1 tablet by mouth every 4 (four) hours as needed for up to 7 days for moderate pain.   ibuprofen 200 MG tablet Commonly known as:  ADVIL,MOTRIN Take 200 mg by mouth every 6 (six) hours as needed for headache or mild pain.   levothyroxine 50 MCG tablet Commonly known as:  SYNTHROID, LEVOTHROID Take 1 tablet (50 mcg total) by mouth daily before breakfast.   lubiprostone 24 MCG capsule Commonly known as:  AMITIZA Take 1 capsule (24 mcg total) by mouth 2 (two) times daily with a meal.   magnesium oxide 400 MG tablet Commonly known as:  MAG-OX Take 400 mg by mouth every evening.   multivitamin with minerals Tabs tablet Take 1  tablet by mouth daily.   omeprazole 20 MG capsule Commonly known as:  PRILOSEC 1 PO 30 MINS PRIOR TO BREAKFAST. What changed:    how much to take  how to take this  when to take this  additional instructions   ondansetron 4 MG tablet Commonly known as:  ZOFRAN Take 1 tablet (4 mg total) by mouth 3 (three) times daily as needed for nausea or vomiting.   umeclidinium-vilanterol 62.5-25 MCG/INH Aepb Commonly known as:  ANORO ELLIPTA Inhale 1 puff into the lungs daily.      Follow-up Information    Carole Civil, MD Follow up.   Specialties:  Orthopedic Surgery, Radiology Contact information: [address] [place] [address] [phone]           Signed: Arther Abbott 03/25/2018, 7:27 AM   Her follow-up will be:  1 week from today

## 2018-03-25 NOTE — Care Management Note (Signed)
Case Management Note  Patient Details  Name: [name] MRN: [id number] Date of Birth: [birth year]  Subjective/Objective:           Admitted with cellulitis. Pt from home, ind pta. Has home oxygen. No HH pta.          Action/Plan: DC home today. Pt recommended for [facility] PT. She is agreeable and has chosen [facility] from list of providers, aware [place] has 48 hs to make first visit. Tim, [facility] rep, aware of referral and will pull pt info from chart.   Expected Discharge Date:  03/25/18               Expected Discharge Plan:  [place]  In-House Referral:  NA  Discharge planning Services  CM Consult  Post Acute Care Choice:  Home Health Choice offered to:  Patient  HH Arranged:  PT [place] Agency:  [facility]  Status of Service:  Completed, signed off  If discussed at [employer], dates discussed:    Additional Comments:  Sherald Barge, RN 03/25/2018, 9:05 AM

## 2018-03-26 ENCOUNTER — Other Ambulatory Visit: Payer: Self-pay | Admitting: Orthopedic Surgery

## 2018-03-26 ENCOUNTER — Ambulatory Visit (INDEPENDENT_AMBULATORY_CARE_PROVIDER_SITE_OTHER): Payer: Self-pay | Admitting: Orthopedic Surgery

## 2018-03-26 ENCOUNTER — Encounter: Payer: Self-pay | Admitting: Orthopedic Surgery

## 2018-03-26 VITALS — BP 107/68 | HR 112 | Temp 98.1°F | Ht 65.0 in

## 2018-03-26 DIAGNOSIS — Z9889 Other specified postprocedural states: Secondary | ICD-10-CM

## 2018-03-26 DIAGNOSIS — Z967 Presence of other bone and tendon implants: Secondary | ICD-10-CM | POA: Diagnosis not present

## 2018-03-26 DIAGNOSIS — Z8781 Personal history of (healed) traumatic fracture: Secondary | ICD-10-CM

## 2018-03-26 NOTE — Progress Notes (Signed)
FOLLOW UP  POST OP AND POST DISCHARGE   Surgery date was June 11  Readmitted June 15 discharge June 18  Received vancomycin  Initial white count 7 C-reactive protein 8.3 sed rate 45  Repeat C-reactive protein 3.3 white count 3.49  Knee looks fine she does have some bruising around the knee the area of cellulitis in the hospital has actually gotten better  Placed in a hinged knee brace economy hinged wrap on  Follow-up in a week x-rays

## 2018-03-31 ENCOUNTER — Telehealth: Payer: Self-pay | Admitting: Gastroenterology

## 2018-03-31 NOTE — Telephone Encounter (Signed)
Pt called back to reschedule her OV until Sept. And said for DS to disregard the previous call.

## 2018-03-31 NOTE — Telephone Encounter (Signed)
Pt called asking for DS. She has questions about her givens capsule. Please call her at [phone]

## 2018-03-31 NOTE — Telephone Encounter (Signed)
I spoke with pt and she was aware of results and plan. She is in wheelchair now due to a fall.  She has rescheduled her appt to 06/25/2018.  She is aware we will send orders for the blood work prior to her appt. She has recently had blood work from the orhopedic dr and they are in epic.

## 2018-04-01 ENCOUNTER — Other Ambulatory Visit: Payer: Self-pay

## 2018-04-01 ENCOUNTER — Ambulatory Visit (INDEPENDENT_AMBULATORY_CARE_PROVIDER_SITE_OTHER): Payer: PPO | Admitting: Orthopedic Surgery

## 2018-04-01 DIAGNOSIS — Z8781 Personal history of (healed) traumatic fracture: Secondary | ICD-10-CM

## 2018-04-01 DIAGNOSIS — Z967 Presence of other bone and tendon implants: Secondary | ICD-10-CM

## 2018-04-01 DIAGNOSIS — Z9889 Other specified postprocedural states: Secondary | ICD-10-CM

## 2018-04-01 NOTE — Telephone Encounter (Signed)
Pt is aware. Orders are on file for 05/09/2018 from Dr. Walden Field.

## 2018-04-01 NOTE — Progress Notes (Signed)
Routine postop visit  Surgery date June 11  Procedure open treatment internal fixation right patella fracture  Patient admitted to the hospital for cellulitis treated with vancomycin and then oral antibiotics comes in today for wound check and suture removal  Currently in a hinged knee brace weightbearing as tolerated  Her knee looks great her suture line looks good we will remove the staples.  She finished her antibiotics.  Follow-up 4 weeks for x-ray right knee AP and lateral  Encounter Diagnosis  Name Primary?  . S/P ORIF (open reduction internal fixation) fracture right patella 03/18/18 Yes    Continue hinged bracing weight-bear as tolerated active range of motion

## 2018-04-01 NOTE — Telephone Encounter (Signed)
PLEASE CALL PT. I REVIEWED HER JUN 2019 LABS. HER BLOOD COUNT IS 10.4. SHE SHOULD REPEAT CBC/FERRITIN IN AUG 2019.

## 2018-04-02 ENCOUNTER — Telehealth: Payer: Self-pay

## 2018-04-02 NOTE — Telephone Encounter (Signed)
Pt called and wanted me to review her lab results again with her. She said she just feels so "YUCKY". She thinks it is probably from her fall and being confined so much. She has finished her antibiotics and is hoping that helps some. She is aware Dr. Oneida Alar is off this week, but I can send a message to another provider.  She said no, no need to, she just wanted me to review her labs with her. Said she is OK, and she will call back if she feels she needs something. Forwarding to Dr Oneida Alar and also FYI to Neil Crouch, PA who is doing hospital call today.

## 2018-04-03 ENCOUNTER — Other Ambulatory Visit: Payer: Self-pay

## 2018-04-03 DIAGNOSIS — R42 Dizziness and giddiness: Secondary | ICD-10-CM

## 2018-04-03 NOTE — Telephone Encounter (Signed)
Patient had surgery on June 11 to repair right patella fracture.  Prior to surgery her hemoglobin was 13.3.  Postoperatively hemoglobin declined to 10.4.   Would offer her repeat CBC at this time especially if she is feeling fatigued, lightheaded, shortness of breath.

## 2018-04-03 NOTE — Telephone Encounter (Signed)
Pt called office, she will have transportation Tuesday to have lab work done.

## 2018-04-03 NOTE — Telephone Encounter (Signed)
Pt is aware and would like to have her labs at [facility]. I have faxed the orders to [facility] lab.

## 2018-04-07 ENCOUNTER — Encounter: Payer: Self-pay | Admitting: Family Medicine

## 2018-04-07 IMAGING — CT CT CHEST W/O CM
2 of 4 series · 14 of 46 positions shown, 16 images · non-contrast
Comparison: Multiple priors, most recently chest CT 05/09/2015.

CLINICAL DATA: 74-year-old female with history of pulmonary
nodules. History of lung cancer status post wedge resection in 4914.
Followup study. Former smoker. Chronic shortness of breath related
to COPD.

EXAM:
CT CHEST WITHOUT CONTRAST
TECHNIQUE: Multidetector CT imaging of the chest was performed following the
standard protocol without IV contrast.

[Series 3: routine chest wo without · axial · non-contrast · 0.68mm/px · z∈[-307,-65]mm · 11 of 141 slices shown, 13 images]
[im 10/141  soft-tissue]
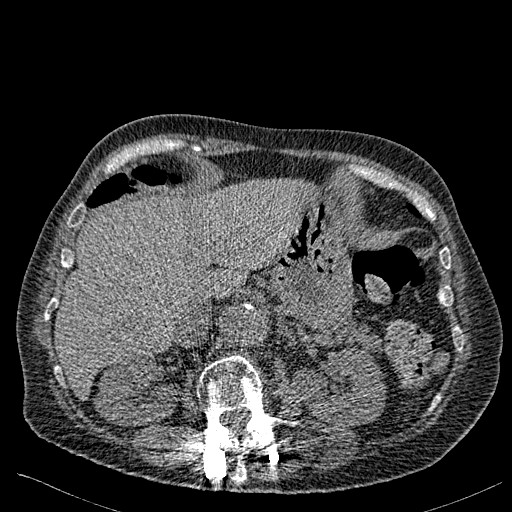
[im 10/141  bone]
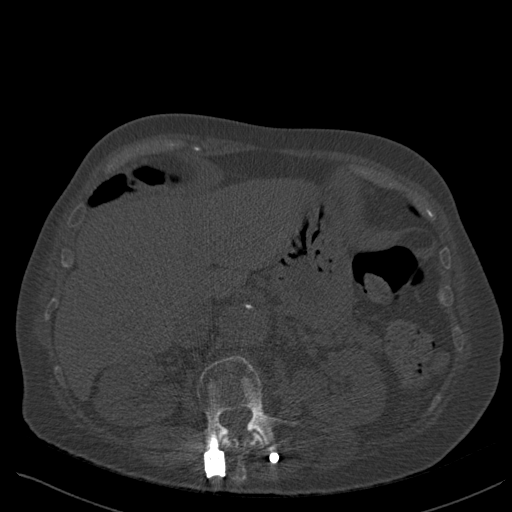
[im 19/141  soft-tissue]
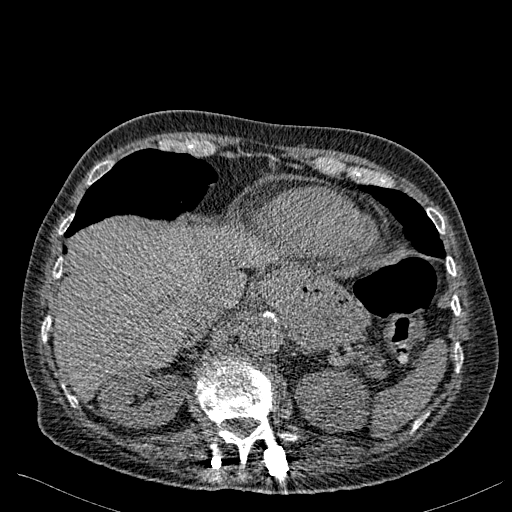
[im 38/141  soft-tissue]
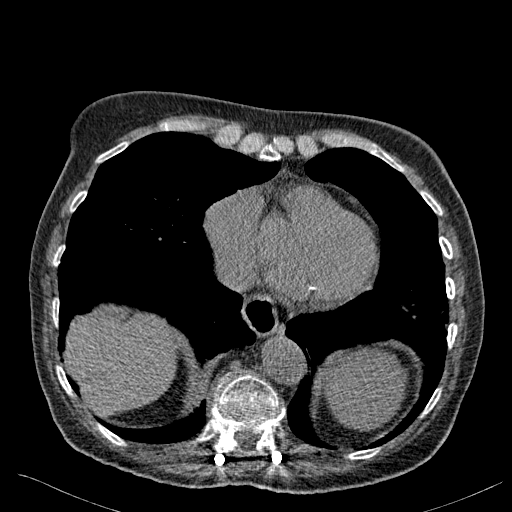
[im 47/141  soft-tissue]
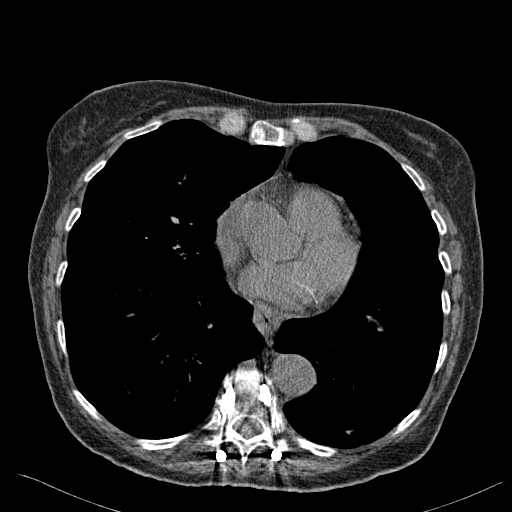
[im 57/141  soft-tissue]
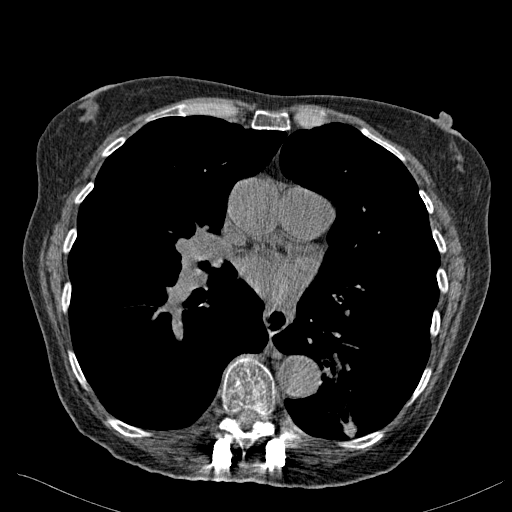
[im 75/141  soft-tissue]
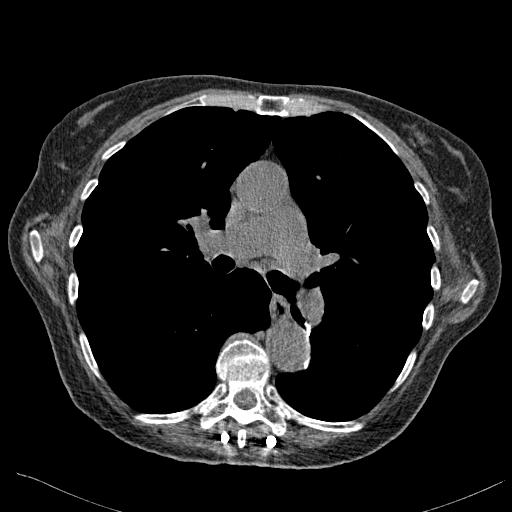
[im 85/141  soft-tissue]
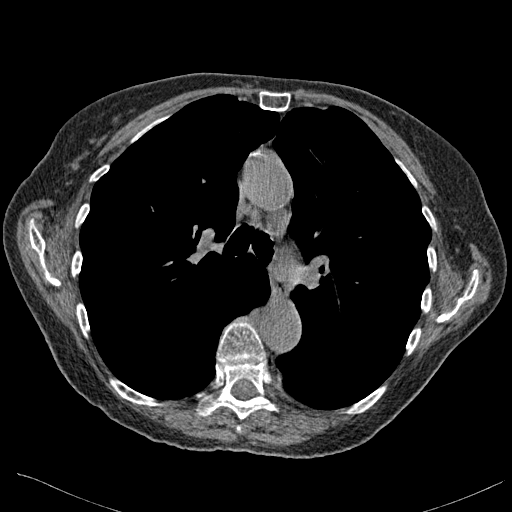
[im 94/141  soft-tissue]
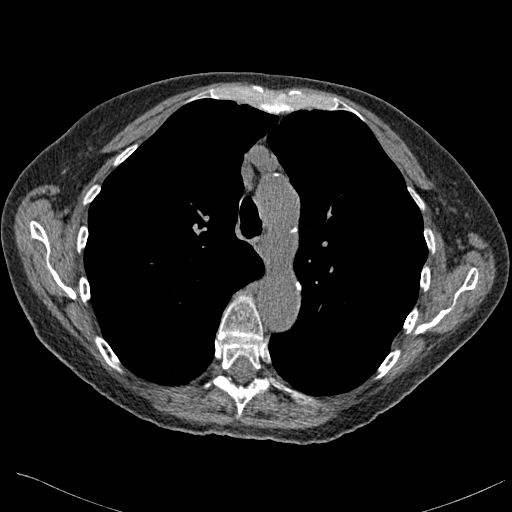
[im 103/141  soft-tissue]
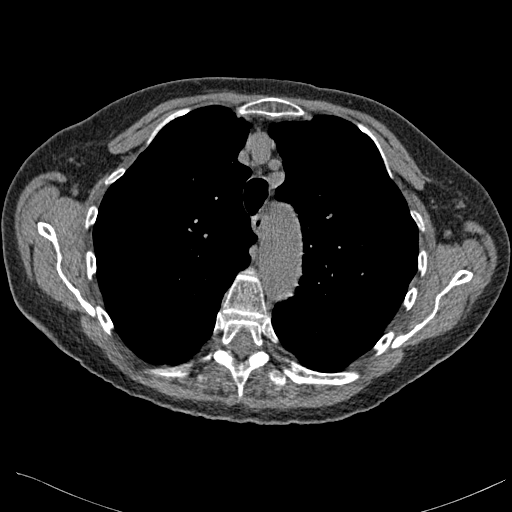
[im 103/141  bone]
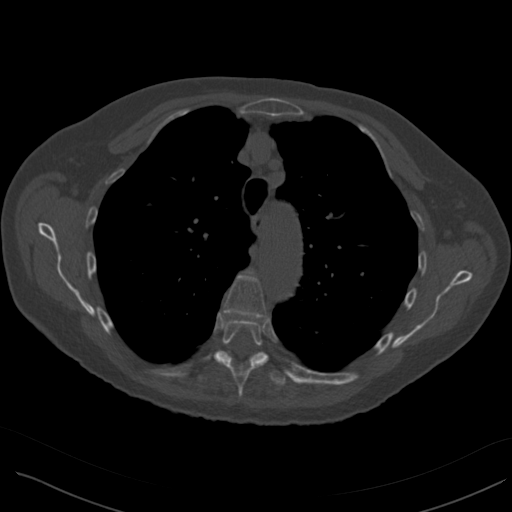
[im 122/141  soft-tissue]
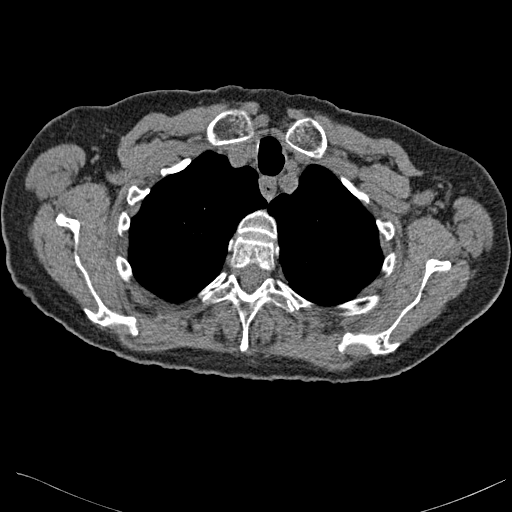
[im 131/141  soft-tissue]
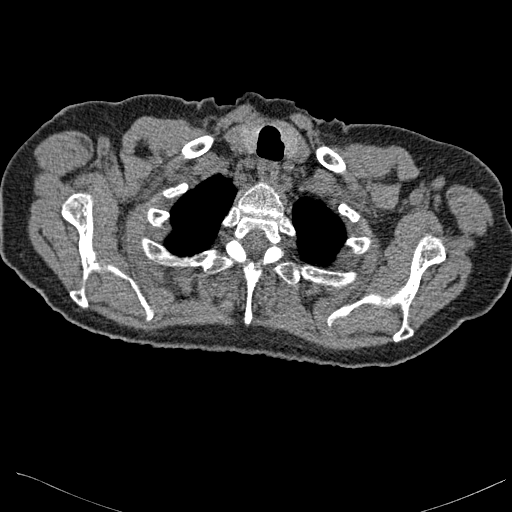

[Series 5: coronal · coronal · 0.57mm/px · 3 of 148 slices shown]
[im 50/148  soft-tissue]
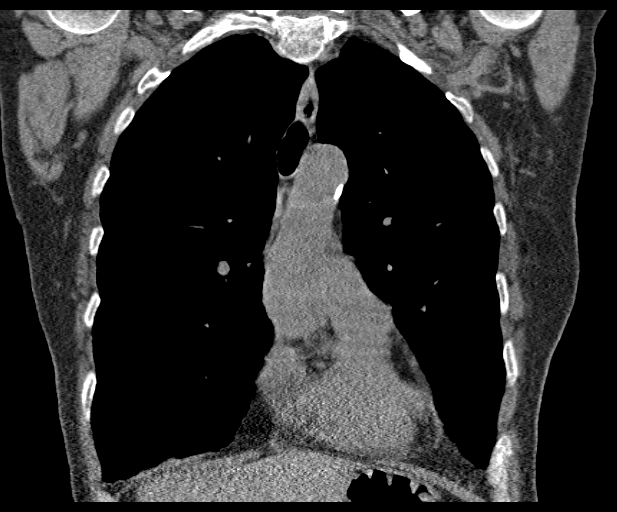
[im 66/148  soft-tissue]
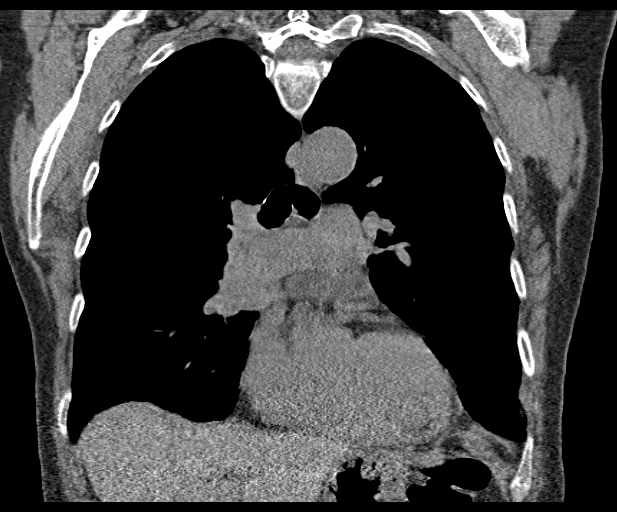
[im 82/148  soft-tissue]
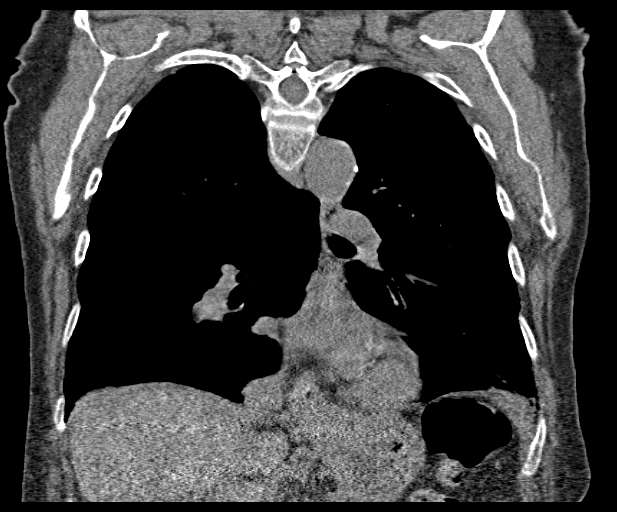

[14 of 46 positions shown; findings below may reference images not displayed]

FINDINGS: Cardiovascular: Heart size is normal. Small amount of pericardial
fluid and/or thickening, unlikely to be of any hemodynamic
significance at this time. No associated pericardial calcification.
There is aortic atherosclerosis, as well as atherosclerosis of the
great vessels of the mediastinum and the coronary arteries,
including calcified atherosclerotic plaque in the left main and left
circumflex coronary arteries.

Mediastinum/Nodes: No pathologically enlarged mediastinal or hilar
lymph nodes. Please note that accurate exclusion of hilar adenopathy
is limited on noncontrast CT scans. Small hiatal hernia. No axillary
lymphadenopathy.

Lungs/Pleura: Postoperative changes of superior segmentectomy in the
left lower lobe are again noted. There is some postoperative
scarring and architectural distortion in the adjacent basilar
segments of the left lower lobe which appears similar to prior
examinations. Diffuse bronchial wall thickening with moderate to
severe centrilobular emphysema. Additional areas of more profound
thickening of the peribronchovascular interstitium are noted,
predominantly throughout the lung bases. Some areas of architectural
distortion are noted throughout the lung bases, and associated
Vu with areas of very mild cylindrical bronchiectasis, likely
to reflect areas of chronic post infectious/inflammatory scarring.
The nodule of concern on prior examinations in the superior segment
of the right lower lobe appears slightly larger on today's study,
currently measuring 8 x 6 mm (mean diameter of 7 mm), however, this
may simply be technique related, as today's images were
reconstructed at 2 mm thickness (versus 5 mm thickness on the prior
study). No other definite suspicious appearing pulmonary nodules or
masses are noted. No acute consolidative airspace disease. No
pleural effusions.

Upper Abdomen: Visualized portions are unremarkable.

Musculoskeletal: Chronic compression fractures are noted at T8 and
T12, most severe at T12 where there is complete loss of central
vertebral body height and approximately 8 mm of retropulsion of
fracture fragments, which is similar to the prior examination. New
compression fracture at L1 with approximately 50% loss of central
vertebral body height. Orthopedic rod and screw fixation devices in
the thoracolumbar spine starting at T10, and extending below the
level of [HOSPITAL] L2. There are no aggressive appearing lytic or
blastic lesions noted in the visualized portions of the skeleton.
IMPRESSION: 1. Although the previously described ground-glass attenuation nodule
in the superior segment of the right lower lobe measures slightly
larger on today's examination than prior studies, this is favored to
be technique related. Today's study should serve as a baseline for
future followup examinations. At this time, there is no central
solid component to this lesion. No new nodules are noted.
2. Stable postoperative scarring in the left lower lobe and post
infectious scarring throughout the visualize lung bases, as above.
3. Areas of cylindrical bronchiectasis throughout the lung bases
bilaterally.
4. Aortic atherosclerosis, in addition to left main and left
circumflex coronary artery disease. Assessment for potential risk
factor modification, dietary therapy or pharmacologic therapy may be
warranted, if clinically indicated.
5. Interval development of a compression fracture at L1 with
approximately 50% loss of central vertebral body height. This is age
indeterminate, but new compared to the prior examination. Other
compression fractures at T8 and T12 appear similar to the prior
examination.
6. Diffuse bronchial wall thickening with moderate to severe
centrilobular emphysema; imaging findings compatible with the
reported clinical history of COPD.
7. Small hiatal hernia.

## 2018-04-08 ENCOUNTER — Other Ambulatory Visit (HOSPITAL_COMMUNITY)
Admission: RE | Admit: 2018-04-08 | Discharge: 2018-04-08 | Disposition: A | Discharge status: Home / Self Care | Payer: PPO | Source: Ambulatory Visit | Attending: Gastroenterology | Admitting: Gastroenterology

## 2018-04-08 ENCOUNTER — Encounter: Payer: Self-pay | Admitting: Family Medicine

## 2018-04-08 ENCOUNTER — Telehealth: Payer: Self-pay | Admitting: Gastroenterology

## 2018-04-08 DIAGNOSIS — H5213 Myopia, bilateral: Secondary | ICD-10-CM | POA: Diagnosis not present

## 2018-04-08 DIAGNOSIS — R42 Dizziness and giddiness: Secondary | ICD-10-CM | POA: Insufficient documentation

## 2018-04-08 DIAGNOSIS — H524 Presbyopia: Secondary | ICD-10-CM | POA: Diagnosis not present

## 2018-04-08 DIAGNOSIS — Z961 Presence of intraocular lens: Secondary | ICD-10-CM | POA: Diagnosis not present

## 2018-04-08 DIAGNOSIS — H52201 Unspecified astigmatism, right eye: Secondary | ICD-10-CM | POA: Diagnosis not present

## 2018-04-08 LAB — CBC WITH DIFFERENTIAL/PLATELET
Basophils Absolute: 0 10*3/uL (ref 0.0–0.1)
Basophils Relative: 1 %
Eosinophils Absolute: 0.1 10*3/uL (ref 0.0–0.7)
Eosinophils Relative: 2 %
HCT: 37.1 % (ref 36.0–46.0)
Hemoglobin: 12.2 g/dL (ref 12.0–15.0)
Lymphocytes Relative: 25 %
Lymphs Abs: 1.3 10*3/uL (ref 0.7–4.0)
MCH: 30.2 pg (ref 26.0–34.0)
MCHC: 32.9 g/dL (ref 30.0–36.0)
MCV: 91.8 fL (ref 78.0–100.0)
Monocytes Absolute: 0.4 10*3/uL (ref 0.1–1.0)
Monocytes Relative: 9 %
Neutro Abs: 3.2 10*3/uL (ref 1.7–7.7)
Neutrophils Relative %: 63 %
Platelets: 385 10*3/uL (ref 150–400)
RBC: 4.04 MIL/uL (ref 3.87–5.11)
RDW: 13.2 % (ref 11.5–15.5)
WBC: 5.1 10*3/uL (ref 4.0–10.5)

## 2018-04-08 NOTE — Progress Notes (Signed)
Pt is aware of results. 

## 2018-04-08 NOTE — Telephone Encounter (Signed)
REVIEWED. Hb NL.

## 2018-04-08 NOTE — Telephone Encounter (Signed)
Pt called to say she went to the lab and the girl there said there wasn't an order for Ferritin, but she drew 2 tubes just incase. I told her that DS was at lunch and I would let her know.

## 2018-04-08 NOTE — Telephone Encounter (Signed)
I called and informed pt that this time she only had a CBC ordered because she had been feeling so fatigued. She is aware her ferritin is to be checked in August ( previous orders on file by Dr. Walden Field).

## 2018-04-09 ENCOUNTER — Encounter: Payer: Self-pay | Admitting: Family Medicine

## 2018-04-15 ENCOUNTER — Other Ambulatory Visit: Payer: Self-pay | Admitting: Family Medicine

## 2018-04-15 ENCOUNTER — Ambulatory Visit: Payer: PPO | Admitting: Cardiovascular Disease

## 2018-04-15 ENCOUNTER — Ambulatory Visit: Payer: PPO | Admitting: Nurse Practitioner

## 2018-04-15 NOTE — Telephone Encounter (Signed)
Ok to refill??  Last office visit 01/07/2018.  Last refill 01/17/2018.

## 2018-04-17 ENCOUNTER — Telehealth: Payer: Self-pay

## 2018-04-17 MED ORDER — LINACLOTIDE 72 MCG PO CAPS: 72.0000 ug | ORAL_CAPSULE | Freq: Every day | ORAL | 3 refills | Status: DC

## 2018-04-17 NOTE — Telephone Encounter (Signed)
Pt called and said she is still having some nausea and no appetite much. She has some Zofran, but hasn't been taking it. I encouraged her to see if that would help and if she didn't have the nausea, maybe she would be able to eat better.  She also said the Amitiza 24 mcg bid has not been doing anything for her. She took Senna laxative yesterday and had a good BM. I told her I would see if we could send another Rx for constipation.  She uses [facility]. Forwarding to Neil Crouch, PA to advise in Dr. Oneida Alar absence.

## 2018-04-17 NOTE — Telephone Encounter (Signed)
If she does not want to try the linzess we can try Trulance 3mg  daily. #30 1 refill.   Ask pharmacy to void the linzess rx.

## 2018-04-17 NOTE — Telephone Encounter (Signed)
Stop Amitiza, may be contributing to her nausea. Take zofran as prescribed.   Sent in RX Linzess 19mcg daily, #30, 3 refills for constipation.   Call back in no improvement.

## 2018-04-17 NOTE — Telephone Encounter (Signed)
Pt said she has tried the Linzess before ( from PCP) and it did not work. I told her it has 3 different strengths, but she does not remember which she tried, just said it did not work.

## 2018-04-18 NOTE — Telephone Encounter (Signed)
PT is aware.

## 2018-04-18 NOTE — Telephone Encounter (Signed)
I called [name] and spoke to [employer]. He cancelled the Linzess and took the order for the Trulance. I called pt and LMOM for a return call.

## 2018-04-21 ENCOUNTER — Telehealth: Payer: Self-pay

## 2018-04-21 NOTE — Telephone Encounter (Signed)
Pt called and said she remembered that years ago she was given a prescription for Librax for IBS by one of her doctors. She has always had symptoms of diarrhea and changing to constipation, back and forth. Just recently the constipation lasted longer because of some of her meds.  She said the Librax helped her so much, she wants to know if Dr. Oneida Alar would prescribe it for her. She thinks that might be better for her than the constipation meds. Please advise!

## 2018-04-24 ENCOUNTER — Encounter: Payer: Self-pay | Admitting: Orthopedic Surgery

## 2018-04-24 ENCOUNTER — Ambulatory Visit (INDEPENDENT_AMBULATORY_CARE_PROVIDER_SITE_OTHER): Payer: PPO | Admitting: Orthopedic Surgery

## 2018-04-24 VITALS — BP 111/66 | HR 95 | Temp 99.6°F | Ht 65.0 in | Wt 111.0 lb

## 2018-04-24 DIAGNOSIS — Z9889 Other specified postprocedural states: Secondary | ICD-10-CM

## 2018-04-24 DIAGNOSIS — Z967 Presence of other bone and tendon implants: Secondary | ICD-10-CM

## 2018-04-24 DIAGNOSIS — T8149XA Infection following a procedure, other surgical site, initial encounter: Secondary | ICD-10-CM

## 2018-04-24 DIAGNOSIS — Z8781 Personal history of (healed) traumatic fracture: Secondary | ICD-10-CM

## 2018-04-24 MED ORDER — CEPHALEXIN 500 MG PO CAPS
500.0000 mg | ORAL_CAPSULE | Freq: Two times a day (BID) | ORAL | 0 refills | Status: DC
Start: 2018-04-24 — End: 2018-07-11

## 2018-04-24 MED ORDER — CILIDINIUM-CHLORDIAZEPOXIDE 2.5-5 MG PO CAPS: 1.0000 | ORAL_CAPSULE | Freq: Two times a day (BID) | ORAL | 3 refills | Status: DC | PRN

## 2018-04-24 NOTE — Progress Notes (Signed)
POSTOP VISIT  POD #37  Chief Complaint  Patient presents with  . Follow-up    R knee sx 03/18/18 has redness and warm to touch    77 year old female had open treatment internal fixation patella fracture 37 days ago.  She was admitted to the hospital for wound cellulitis treated with IV Vanco and then Keflex.  She said she started to have some aching in the knee that she had not had before and when she looked at it it looked red again she comes in for evaluation and treatment  BP 111/66   Pulse 95   Temp 99.6 F (37.6 C) (Tympanic)   Ht 5\' 5"  (1.651 m)   Wt 111 lb (50.3 kg)   BMI 18.47 kg/m   She moves her knee very well she has a 3 degree lack of extension she flexes 115 degrees I put a picture in the chart there is some redness around a lot of it seems to be subcu bruising that is resolving especially proximally the distal portion at the anteromedial portion of the wound does have what may be some cellulitis.  Her incision line looks otherwise good     Encounter Diagnoses  Name Primary?  . S/P ORIF (open reduction internal fixation) fracture right patella 03/18/18   . Postoperative wound cellulitis Yes      Postoperative plan (Work, [employer],  Meds ordered this encounter  Medications  . cephALEXin (KEFLEX) 500 MG capsule    Sig: Take 1 capsule (500 mg total) by mouth 2 (two) times daily.    Dispense:  28 capsule    Refill:  0  ,FU)  I will see her in a week to make sure that this is indeed clearing up and the picture again is in the chart for reference

## 2018-04-24 NOTE — Telephone Encounter (Signed)
PLEASE CALL PT. RX SENT FOR LIBRAX TWICE DAILY WHEN NEEDED TO CONTROL DIARRHEA. SHE SHOULD BE CAREFUL. IT CAN CAUSE DROWSINESS AND INCREASE THE RISK OF FALLING.

## 2018-04-25 NOTE — Telephone Encounter (Signed)
PT is aware.

## 2018-04-29 ENCOUNTER — Telehealth: Payer: Self-pay

## 2018-04-29 NOTE — Telephone Encounter (Addendum)
I am working on PA for Librax. LMOM for a return call from pt to discuss previous meds that she has taken instead of Librax.  Pt said she has not taken anything similar to Librax. I told her the PA will probably not be approved and she said no worries, it was going to cost around $50.00 but she just couldn't buy it at the moment.

## 2018-04-30 ENCOUNTER — Ambulatory Visit (INDEPENDENT_AMBULATORY_CARE_PROVIDER_SITE_OTHER): Payer: PPO | Admitting: Family Medicine

## 2018-04-30 ENCOUNTER — Encounter: Payer: Self-pay | Admitting: Orthopedic Surgery

## 2018-04-30 ENCOUNTER — Ambulatory Visit (INDEPENDENT_AMBULATORY_CARE_PROVIDER_SITE_OTHER): Payer: PPO

## 2018-04-30 ENCOUNTER — Encounter: Payer: Self-pay | Admitting: Family Medicine

## 2018-04-30 ENCOUNTER — Other Ambulatory Visit: Payer: Self-pay

## 2018-04-30 ENCOUNTER — Ambulatory Visit (INDEPENDENT_AMBULATORY_CARE_PROVIDER_SITE_OTHER): Payer: PPO | Admitting: Orthopedic Surgery

## 2018-04-30 VITALS — BP 118/68 | HR 80 | Temp 98.5°F | Resp 16 | Ht 65.0 in | Wt 112.1 lb

## 2018-04-30 VITALS — BP 130/73 | HR 78 | Ht 65.0 in | Wt 111.0 lb

## 2018-04-30 DIAGNOSIS — Z7689 Persons encountering health services in other specified circumstances: Secondary | ICD-10-CM

## 2018-04-30 DIAGNOSIS — Z9889 Other specified postprocedural states: Secondary | ICD-10-CM

## 2018-04-30 DIAGNOSIS — Z8781 Personal history of (healed) traumatic fracture: Secondary | ICD-10-CM

## 2018-04-30 DIAGNOSIS — Z967 Presence of other bone and tendon implants: Secondary | ICD-10-CM

## 2018-04-30 NOTE — Progress Notes (Signed)
Chief Complaint  Patient presents with  . Post-op Follow-up    right patella ORIF     Surgery date June 11 ORIF right patella patient treated for cellulitis with IV vancomycin and then oral antibiotics we finished that and she developed some more erythema around the knee who put her on Keflex she has improved her x-ray shows stable fracture fixation there is some separation of the fracture fragments but clinically she has 125 degrees of flexion full extension and excellent quadriceps function  Recommend follow-up in 4 weeks repeat x-ray continue Keflex until finished

## 2018-04-30 NOTE — Progress Notes (Signed)
Patient ID: [name], female    DOB: August 24, 1941, 77 y.o.   MRN: [id number]  Chief Complaint  Patient presents with  . Establish Care    Allergies Ciprofloxacin hcl; Iohexol; Prozac [fluoxetine hcl]; and Sulfonamide derivatives  Subjective:   [name] is a 77 y.o. female who presents to [facility] today.  HPI Patient reports that she is previously been seen by [facility] family medicine.  She was going to transfer her care to our office because she has to travel via [place] and has difficulty with transportation.  When she arrived at our office she found out that I was not going to be practicing in this office as of September 24.  She wishes today not to establish care in our office but reports that despite the transportation difficulties she will continue to be followed by her previous PCP.  She reports that she had only switch from him due to transportation issues and is always been very happy with the care that she has received from him.  Therefore she wishes to cancel this visit and will reschedule with previous physician.  She reports she has no issues today that need to be discussed and she was only coming to establish care.   Past Medical History:  Diagnosis Date  . Adenocarcinoma of lung ([place]) 12/2010   left lung/surg only  . Allergic rhinitis   . 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 Anxiety disorder   . Aortic aneurysm ([place])   . Complication of anesthesia 2011   bloodpressure dropped during colonoscopy, not problems since  . Compression fracture   . COPD (chronic obstructive pulmonary disease) ([place])   . Depression   . Diastolic dysfunction   . Diverticulosis   . Dysphagia   . Emphysema lung ([place]) 11/08/2014  . Hiatal hernia   . Hypothyroidism   . IBS (irritable bowel syndrome)   . Iron deficiency anemia due to chronic blood loss 12/05/2016  . On home O2 12/15/2013   chronic hypoxia  . PUD (peptic ulcer disease)      70yrs  . Shortness of breath    with exertion  . SIADH (syndrome of inappropriate ADH production) ([place])   . Trigeminal neuralgia     Past Surgical History:  Procedure Laterality Date  . ABDOMINAL HYSTERECTOMY    . BACK SURGERY  January 13, 2014  . CATARACT EXTRACTION    . CATARACT EXTRACTION W/PHACO  09/02/2012   Procedure: CATARACT EXTRACTION PHACO AND INTRAOCULAR LENS PLACEMENT (IOC);  Surgeon: Elta Guadeloupe T. Gershon Crane, MD;  [facility];  Service: Ophthalmology;  Laterality: Left;  CDE:10.35  . CHOLECYSTECTOMY    . 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 COLONOSCOPY N/A 11/23/2014   Dr. Oneida Alar: moderate diverticulosis, hemorrhoids, redundant colon. next TCS in 10-15 years.   Marland Kitchen ENDARTERECTOMY Right 08/21/2013   Procedure: RIGHT CAROTID ANEURYSM RESECTION;  Surgeon: Rosetta Posner, MD;  Location: [place];  Service: Vascular;  Laterality: Right;  . ESOPHAGEAL DILATION  02/24/2018   Procedure: ESOPHAGEAL DILATION;  Surgeon: Danie Binder, MD;  [facility];  Service: Endoscopy;;  . ESOPHAGOGASTRODUODENOSCOPY  11/13/2009   w/dilation to 39mm, gastric ulceration (H.Pylori) s/p treatment  . ESOPHAGOGASTRODUODENOSCOPY  11/21/2009   distal esophageal web, gastritis  . ESOPHAGOGASTRODUODENOSCOPY  03/14/12   KGY:[id number] in the distal esophagus/Mild gastritis/small HH. + H.pylori, prescribed Pylera. Finished treatment.   Marland Kitchen  ESOPHAGOGASTRODUODENOSCOPY N/A 12/13/2017   Procedure: ESOPHAGOGASTRODUODENOSCOPY (EGD);  Surgeon: Danie Binder, MD;  [facility];  Service: Endoscopy;  Laterality: N/A;  8:30am  . ESOPHAGOGASTRODUODENOSCOPY N/A 02/24/2018   Procedure: ESOPHAGOGASTRODUODENOSCOPY (EGD);  Surgeon: Danie Binder, MD;  [facility];  Service: Endoscopy;  Laterality: N/A;  11:00am  . fatty tumor removal from lt groin    . FRACTURE SURGERY Left    hand and left ring finger  . GIVENS CAPSULE STUDY N/A 12/26/2017   Procedure: GIVENS  CAPSULE STUDY;  Surgeon: Danie Binder, MD;  [facility];  Service: Endoscopy;  Laterality: N/A;  7:30am  . GIVENS CAPSULE STUDY N/A 02/24/2018   Procedure: GIVENS CAPSULE STUDY;  Surgeon: Danie Binder, MD;  [facility];  Service: Endoscopy;  Laterality: N/A;  . KNEE SURGERY     patella tendon repair june 2019 [facility]  . LUNG CANCER SURGERY  12/2010   Left VATS, minithoracotomy, LLL superior segmentectomy  . ORIF PATELLA Right 03/18/2018   Procedure: OPEN REDUCTION INTERNAL (ORIF) FIXATION RIGHT PATELLA;  Surgeon: Carole Civil, MD;  [facility];  Service: Orthopedics;  Laterality: Right;  . ORIF WRIST FRACTURE Left 04/09/2014   Procedure: OPEN REDUCTION INTERNAL FIXATION (ORIF) WRIST FRACTURE;  Surgeon: Renette Butters, MD;  Location: [place];  Service: Orthopedics;  Laterality: Left;  . PERCUTANEOUS PINNING Left 04/09/2014   Procedure: PERCUTANEOUS PINNING EXTREMITY;  Surgeon: Renette Butters, MD;  Location: [place];  Service: Orthopedics;  Laterality: Left;  . SAVORY DILATION N/A 12/13/2017   Procedure: SAVORY DILATION;  Surgeon: Danie Binder, MD;  [facility];  Service: Endoscopy;  Laterality: N/A;  . TUBAL LIGATION    . vocal cord surgery  02/06/2011   laryngoscopy with bilateral vocal cord Radiesse injection for vocal cord paralysis  . YAG LASER APPLICATION Left 41/96/2229   Procedure: YAG LASER APPLICATION;  Surgeon: Rutherford Guys, MD;  [facility];  Service: Ophthalmology;  Laterality: Left;    Family History  Problem Relation Age of Onset  . Pulmonary embolism Mother   . Heart attack Father   . Hypertension Father   . Liver cancer Sister   . Cancer Sister   . Cancer Brother   . Cancer Daughter   . Colon cancer Neg Hx      Social History   Socioeconomic History  . Marital status: Divorced    Spouse name: Not on file  . Number of children: Not on file  . Years of education: Not on file  . Highest education level: Not on  file  Occupational History  . Not on file  Social Needs  . Financial resource strain: Not on file  . Food insecurity:    Worry: Not on file    Inability: Not on file  . Transportation needs:    Medical: Not on file    Non-medical: Not on file  Tobacco Use  . Smoking status: Former Smoker    Packs/day: 0.50    Years: 20.00    Pack years: 10.00    Types: Cigarettes    Last attempt to quit: 12/21/2010    Years since quitting: 7.3  . Smokeless tobacco: Never Used  . Tobacco comment: smoking cessation info given and reviewed   Substance and Sexual Activity  . Alcohol use: Yes    Alcohol/week: 0.0 oz    Comment: seldom  . Drug use: No  . Sexual activity: Yes  Birth control/protection: Surgical  Lifestyle  . Physical activity:    Days per week: Not on file    Minutes per session: Not on file  . Stress: Not on file  Relationships  . Social connections:    Talks on phone: Not on file    Gets together: Not on file    Attends religious service: Not on file    Active member of club or organization: Not on file    Attends meetings of clubs or organizations: Not on file    Relationship status: Not on file  Other Topics Concern  . Not on file  Social History Narrative  . Not on file    Review of Systems   Objective:   BP 118/68 (BP Location: Left Arm, Patient Position: Sitting, Cuff Size: Normal)   Pulse 80   Temp 98.5 F (36.9 C) (Temporal)   Resp 16   Ht 5\' 5"  (1.651 m)   Wt 112 lb 1.9 oz (50.9 kg)   SpO2 90%   BMI 18.66 kg/m   Physical Exam   Assessment and Plan   There are no diagnoses linked to this encounter.   No follow-ups on file. Tod Persia, [place] 04/30/2018

## 2018-05-01 ENCOUNTER — Telehealth: Payer: Self-pay

## 2018-05-01 NOTE — Telephone Encounter (Signed)
Pt left Vm that she has questions about a medication. I returned her call and asked for a return call.

## 2018-05-06 ENCOUNTER — Ambulatory Visit (HOSPITAL_COMMUNITY)
Admission: RE | Admit: 2018-05-06 | Discharge: 2018-05-06 | Disposition: A | Discharge status: Home / Self Care | Payer: PPO | Source: Ambulatory Visit | Attending: Internal Medicine | Admitting: Internal Medicine

## 2018-05-06 ENCOUNTER — Encounter: Payer: Self-pay | Admitting: Cardiovascular Disease

## 2018-05-06 ENCOUNTER — Ambulatory Visit: Payer: PPO | Admitting: Cardiovascular Disease

## 2018-05-06 ENCOUNTER — Inpatient Hospital Stay (HOSPITAL_COMMUNITY): Payer: PPO | Attending: Hematology

## 2018-05-06 VITALS — BP 128/84 | HR 83 | Ht 64.0 in | Wt 113.0 lb

## 2018-05-06 DIAGNOSIS — D500 Iron deficiency anemia secondary to blood loss (chronic): Secondary | ICD-10-CM | POA: Diagnosis not present

## 2018-05-06 DIAGNOSIS — C3432 Malignant neoplasm of lower lobe, left bronchus or lung: Secondary | ICD-10-CM | POA: Insufficient documentation

## 2018-05-06 DIAGNOSIS — I2510 Atherosclerotic heart disease of native coronary artery without angina pectoris: Secondary | ICD-10-CM | POA: Insufficient documentation

## 2018-05-06 DIAGNOSIS — M4854XA Collapsed vertebra, not elsewhere classified, thoracic region, initial encounter for fracture: Secondary | ICD-10-CM | POA: Diagnosis not present

## 2018-05-06 DIAGNOSIS — C3492 Malignant neoplasm of unspecified part of left bronchus or lung: Secondary | ICD-10-CM

## 2018-05-06 DIAGNOSIS — R0600 Dyspnea, unspecified: Secondary | ICD-10-CM

## 2018-05-06 DIAGNOSIS — R0609 Other forms of dyspnea: Secondary | ICD-10-CM | POA: Diagnosis not present

## 2018-05-06 DIAGNOSIS — I309 Acute pericarditis, unspecified: Secondary | ICD-10-CM | POA: Insufficient documentation

## 2018-05-06 DIAGNOSIS — N200 Calculus of kidney: Secondary | ICD-10-CM | POA: Diagnosis not present

## 2018-05-06 DIAGNOSIS — J439 Emphysema, unspecified: Secondary | ICD-10-CM

## 2018-05-06 DIAGNOSIS — I700 Atherosclerosis of aorta: Secondary | ICD-10-CM | POA: Insufficient documentation

## 2018-05-06 LAB — COMPREHENSIVE METABOLIC PANEL
ALT: 18 U/L (ref 0–44)
AST: 18 U/L (ref 15–41)
Albumin: 4.6 g/dL (ref 3.5–5.0)
Alkaline Phosphatase: 46 U/L (ref 38–126)
Anion gap: 7 (ref 5–15)
BUN: 12 mg/dL (ref 8–23)
CO2: 30 mmol/L (ref 22–32)
Calcium: 9.9 mg/dL (ref 8.9–10.3)
Chloride: 95 mmol/L — ABNORMAL LOW (ref 98–111)
Creatinine, Ser: 0.52 mg/dL (ref 0.44–1.00)
GFR calc Af Amer: >60 mL/min (ref >60)
GFR calc non Af Amer: >60 mL/min (ref >60)
Glucose, Bld: 83 mg/dL (ref 70–99)
Potassium: 4.2 mmol/L (ref 3.5–5.1)
Sodium: 132 mmol/L — ABNORMAL LOW (ref 135–145)
Total Bilirubin: 0.5 mg/dL (ref 0.3–1.2)
Total Protein: 7.8 g/dL (ref 6.5–8.1)

## 2018-05-06 LAB — CBC WITH DIFFERENTIAL/PLATELET
Basophils Absolute: 0 10*3/uL (ref 0.0–0.1)
Basophils Relative: 0 %
Eosinophils Absolute: 0.2 10*3/uL (ref 0.0–0.7)
Eosinophils Relative: 2 %
HCT: 41.1 % (ref 36.0–46.0)
Hemoglobin: 13.8 g/dL (ref 12.0–15.0)
Lymphocytes Relative: 21 %
Lymphs Abs: 1.6 10*3/uL (ref 0.7–4.0)
MCH: 30.2 pg (ref 26.0–34.0)
MCHC: 33.6 g/dL (ref 30.0–36.0)
MCV: 89.9 fL (ref 78.0–100.0)
Monocytes Absolute: 0.5 10*3/uL (ref 0.1–1.0)
Monocytes Relative: 7 %
Neutro Abs: 5.3 10*3/uL (ref 1.7–7.7)
Neutrophils Relative %: 70 %
Platelets: 319 10*3/uL (ref 150–400)
RBC: 4.57 MIL/uL (ref 3.87–5.11)
RDW: 12.8 % (ref 11.5–15.5)
WBC: 7.6 10*3/uL (ref 4.0–10.5)

## 2018-05-06 LAB — LACTATE DEHYDROGENASE: LDH: 136 U/L (ref 98–192)

## 2018-05-06 LAB — FERRITIN: Ferritin: 59 ng/mL (ref 11–307)

## 2018-05-06 NOTE — Telephone Encounter (Signed)
REVIEWED. PLEASE CALL PT. SHE ONLY NEEDS TO TAKE TRULANCE IF SHE HAS CONSTIPATION.

## 2018-05-06 NOTE — Telephone Encounter (Signed)
Pt is aware.  

## 2018-05-06 NOTE — Telephone Encounter (Signed)
Pt said she is not needing the Trulance. She is doing better since she quit taking the pain med for her knee pain. She has 4 more days of her antibiotics . She does not want to change a thing now, will let us know if she needs anything.

## 2018-05-06 NOTE — Telephone Encounter (Signed)
LMOM to call.

## 2018-05-06 NOTE — Progress Notes (Signed)
SUBJECTIVE: The patient returns for follow-up after undergoing cardiovascular testing performed for the evaluation of exertional dyspnea, coronary artery calcifications, and pericardial effusion.  She underwent a low risk nuclear stress test on 02/21/2018 with no large ischemic territories.  Echocardiogram demonstrated normal left ventricular systolic function, LVEF 60 to 16%, grade 1 diastolic dysfunction with high ventricular filling pressures, mild tricuspid regurgitation, and a small loculated pericardial effusion adjacent to the right atrium.  Symptoms of shortness of breath have improved.  She cleaned out the black mold she discovered in her apartment which is due to a leaky toilet.  Oxygen saturations today are 92% on room air.  She denies chest pain and palpitations.  She recently fell off her bed and fractured her right knee and underwent surgery for this.      Review of Systems: As per "subjective", otherwise negative.  Allergies  Allergen Reactions  . Ciprofloxacin Hcl Hives  . Iohexol Other (See Comments)     Desc: Per [facility], pt is allergic to IV contrast, no type of reaction was available. Pt cannot remember but first reacted around 15 yrs ago from an IVP. Notes were date from 2009 at urology center., Onset Date: [id number]   . Prozac [Fluoxetine Hcl] Rash  . Sulfonamide Derivatives Hives and Rash    Current Outpatient Medications  Medication Sig Dispense Refill  . acetaminophen (TYLENOL) 325 MG tablet Take 650 mg by mouth every 6 (six) hours as needed for moderate pain or headache.     . ALPRAZolam (XANAX) 0.5 MG tablet TAKE (1) TABLET BY MOUTH TWICE A DAY AS NEEDED. 60 tablet 0  . Calcium-Magnesium-Vitamin D (CITRACAL CALCIUM+D) 600-40-500 MG-MG-UNIT TB24 Take 1 tablet by mouth daily.    . cephALEXin (KEFLEX) 500 MG capsule Take 1 capsule (500 mg total) by mouth 2 (two) times daily. 28 capsule 0  . citalopram (CELEXA) 40 MG tablet Take 0.5 tablets (20 mg  total) by mouth at bedtime. 90 tablet 4  . cyclobenzaprine (FLEXERIL) 10 MG tablet Take 1 tablet (10 mg total) by mouth every 8 (eight) hours as needed for muscle spasms. 60 tablet 0  . ferrous sulfate 325 (65 FE) MG tablet Take 325 mg by mouth daily with breakfast.    . ibuprofen (ADVIL,MOTRIN) 200 MG tablet Take 200 mg by mouth every 6 (six) hours as needed for headache or mild pain.    Marland Kitchen levothyroxine (SYNTHROID, LEVOTHROID) 50 MCG tablet Take 1 tablet (50 mcg total) by mouth daily before breakfast. 90 tablet 3  . lubiprostone (AMITIZA) 24 MCG capsule Take 1 capsule (24 mcg total) by mouth 2 (two) times daily with a meal. 180 capsule 3  . magnesium oxide (MAG-OX) 400 MG tablet Take 400 mg by mouth every evening.     . Multiple Vitamin (MULTIVITAMIN WITH MINERALS) TABS tablet Take 1 tablet by mouth daily.    Marland Kitchen omeprazole (PRILOSEC) 20 MG capsule 1 PO 30 MINS PRIOR TO BREAKFAST. (Patient taking differently: Take 20 mg by mouth daily. ) 90 capsule 3  . umeclidinium-vilanterol (ANORO ELLIPTA) 62.5-25 MCG/INH AEPB Inhale 1 puff into the lungs daily. 90 each 3   No current facility-administered medications for this visit.     Past Medical History:  Diagnosis Date  . Adenocarcinoma of lung ([place]) 12/2010   left lung/surg only  . Allergic rhinitis   . 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 Anxiety disorder   . Aortic aneurysm ([place])   .  Complication of anesthesia 2011   bloodpressure dropped during colonoscopy, not problems since  . Compression fracture   . COPD (chronic obstructive pulmonary disease) ([place])   . Depression   . Diastolic dysfunction   . Diverticulosis   . Dysphagia   . Emphysema lung ([place]) 11/08/2014  . Hiatal hernia   . Hypothyroidism   . IBS (irritable bowel syndrome)   . Iron deficiency anemia due to chronic blood loss 12/05/2016  . On home O2 12/15/2013   chronic hypoxia  . PUD (peptic ulcer disease)    63yrs  . Shortness of breath    with  exertion  . SIADH (syndrome of inappropriate ADH production) ([place])   . Trigeminal neuralgia     Past Surgical History:  Procedure Laterality Date  . ABDOMINAL HYSTERECTOMY    . BACK SURGERY  January 13, 2014  . CATARACT EXTRACTION    . CATARACT EXTRACTION W/PHACO  09/02/2012   Procedure: CATARACT EXTRACTION PHACO AND INTRAOCULAR LENS PLACEMENT (IOC);  Surgeon: Elta Guadeloupe T. Gershon Crane, MD;  [facility];  Service: Ophthalmology;  Laterality: Left;  CDE:10.35  . CHOLECYSTECTOMY    . 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 COLONOSCOPY N/A 11/23/2014   Dr. Oneida Alar: moderate diverticulosis, hemorrhoids, redundant colon. next TCS in 10-15 years.   Marland Kitchen ENDARTERECTOMY Right 08/21/2013   Procedure: RIGHT CAROTID ANEURYSM RESECTION;  Surgeon: Rosetta Posner, MD;  Location: [place];  Service: Vascular;  Laterality: Right;  . ESOPHAGEAL DILATION  02/24/2018   Procedure: ESOPHAGEAL DILATION;  Surgeon: Danie Binder, MD;  [facility];  Service: Endoscopy;;  . ESOPHAGOGASTRODUODENOSCOPY  11/13/2009   w/dilation to 47mm, gastric ulceration (H.Pylori) s/p treatment  . ESOPHAGOGASTRODUODENOSCOPY  11/21/2009   distal esophageal web, gastritis  . ESOPHAGOGASTRODUODENOSCOPY  03/14/12   HER:[id number] in the distal esophagus/Mild gastritis/small HH. + H.pylori, prescribed Pylera. Finished treatment.   . ESOPHAGOGASTRODUODENOSCOPY N/A 12/13/2017   Procedure: ESOPHAGOGASTRODUODENOSCOPY (EGD);  Surgeon: Danie Binder, MD;  [facility];  Service: Endoscopy;  Laterality: N/A;  8:30am  . ESOPHAGOGASTRODUODENOSCOPY N/A 02/24/2018   Procedure: ESOPHAGOGASTRODUODENOSCOPY (EGD);  Surgeon: Danie Binder, MD;  [facility];  Service: Endoscopy;  Laterality: N/A;  11:00am  . fatty tumor removal from lt groin    . FRACTURE SURGERY Left    hand and left ring finger  . GIVENS CAPSULE STUDY N/A 12/26/2017   Procedure: GIVENS CAPSULE STUDY;  Surgeon: Danie Binder,  MD;  [facility];  Service: Endoscopy;  Laterality: N/A;  7:30am  . GIVENS CAPSULE STUDY N/A 02/24/2018   Procedure: GIVENS CAPSULE STUDY;  Surgeon: Danie Binder, MD;  [facility];  Service: Endoscopy;  Laterality: N/A;  . KNEE SURGERY     patella tendon repair june 2019 [facility]  . LUNG CANCER SURGERY  12/2010   Left VATS, minithoracotomy, LLL superior segmentectomy  . ORIF PATELLA Right 03/18/2018   Procedure: OPEN REDUCTION INTERNAL (ORIF) FIXATION RIGHT PATELLA;  Surgeon: Carole Civil, MD;  [facility];  Service: Orthopedics;  Laterality: Right;  . ORIF WRIST FRACTURE Left 04/09/2014   Procedure: OPEN REDUCTION INTERNAL FIXATION (ORIF) WRIST FRACTURE;  Surgeon: Renette Butters, MD;  Location: [place];  Service: Orthopedics;  Laterality: Left;  . PERCUTANEOUS PINNING Left 04/09/2014   Procedure: PERCUTANEOUS PINNING EXTREMITY;  Surgeon: Renette Butters, MD;  Location: [place];  Service: Orthopedics;  Laterality: Left;  . SAVORY DILATION N/A 12/13/2017  Procedure: SAVORY DILATION;  Surgeon: Danie Binder, MD;  [facility];  Service: Endoscopy;  Laterality: N/A;  . TUBAL LIGATION    . vocal cord surgery  02/06/2011   laryngoscopy with bilateral vocal cord Radiesse injection for vocal cord paralysis  . YAG LASER APPLICATION Left 51/76/1607   Procedure: YAG LASER APPLICATION;  Surgeon: Rutherford Guys, MD;  [facility];  Service: Ophthalmology;  Laterality: Left;    Social History   Socioeconomic History  . Marital status: Divorced    Spouse name: Not on file  . Number of children: Not on file  . Years of education: Not on file  . Highest education level: Not on file  Occupational History  . Not on file  Social Needs  . Financial resource strain: Somewhat hard  . Food insecurity:    Worry: Never true    Inability: Never true  . Transportation needs:    Medical: No    Non-medical: No  Tobacco Use  . Smoking status: Former Smoker     Packs/day: 0.50    Years: 20.00    Pack years: 10.00    Types: Cigarettes    Last attempt to quit: 12/21/2010    Years since quitting: 7.3  . Smokeless tobacco: Never Used  . Tobacco comment: smoking cessation info given and reviewed   Substance and Sexual Activity  . Alcohol use: Yes    Alcohol/week: 0.0 oz    Comment: seldom  . Drug use: No  . Sexual activity: Yes    Birth control/protection: Surgical  Lifestyle  . Physical activity:    Days per week: 0 days    Minutes per session: 0 min  . Stress: Not at all  Relationships  . Social connections:    Talks on phone: Once a week    Gets together: Never    Attends religious service: Never    Active member of club or organization: No    Attends meetings of clubs or organizations: Never    Relationship status: Divorced  . Intimate partner violence:    Fear of current or ex partner: No    Emotionally abused: No    Physically abused: No    Forced sexual activity: No  Other Topics Concern  . Not on file  Social History Narrative  . Not on file     Vitals:   05/06/18 1442  BP: 128/84  Pulse: 83  SpO2: 92%  Weight: 113 lb (51.3 kg)  Height: 5\' 4"  (1.626 m)    Wt Readings from Last 3 Encounters:  05/06/18 113 lb (51.3 kg)  04/30/18 111 lb (50.3 kg)  04/30/18 112 lb 1.9 oz (50.9 kg)     PHYSICAL EXAM General: NAD HEENT: Normal. Neck: No JVD, no thyromegaly. Lungs: Diminished sounds throughout with poor air movement, no crackles or wheezes. CV: Regular rate and rhythm, normal S1/S2, no S3/S4, no murmur. No pretibial or periankle edema.   Abdomen: Soft, nontender, no distention.  Neurologic: Alert and oriented.  Psych: Normal affect. Skin: Normal. Musculoskeletal: Right knee in brace.    ECG: Reviewed above under Subjective   Labs: Lab Results  Component Value Date/Time   K 4.2 03/25/2018 05:37 AM   BUN 6 03/25/2018 05:37 AM   CREATININE 0.50 03/25/2018 05:37 AM   CREATININE 0.40 (L) 11/01/2016 10:56  AM   ALT 14 12/26/2017 08:08 AM   TSH 1.763 05/13/2017 02:31 PM   TSH 1.25 11/01/2016 10:56 AM   HGB 12.2 04/08/2018  10:50 AM     Lipids: Lab Results  Component Value Date/Time   LDLCALC 127 (H) 10/25/2014 11:27 AM   CHOL 217 (H) 10/25/2014 11:27 AM   TRIG 86 10/25/2014 11:27 AM   HDL 73 10/25/2014 11:27 AM       ASSESSMENT AND PLAN:  1.  Progressive exertional dyspnea with coronary artery calcifications and pericardial effusion: Symptomatically improved.  Oxygen saturations are normal on room air.  Nuclear stress test was low risk.  Left ventricular systolic function was normal.  There is only a small loculated pericardial effusion as noted above.  No further cardiac testing is indicated at this time.  2.  COPD with emphysema : Dramatically improved with normal O2 saturations on room air.  3.  Pericardial effusion: Small loculated effusion as noted above.   Disposition: Follow up as needed   Kate Sable, M.D., F.A.C.C.

## 2018-05-06 NOTE — Patient Instructions (Addendum)
Your physician wants you to follow-up in: as needed  with [name]      Your physician recommends that you continue on your current medications as directed. Please refer to the Current Medication list given to you today.    If you need a refill on your cardiac medications before your next appointment, please call your pharmacy.     No labs or tests today      Thank you for choosing [facility] !

## 2018-05-12 ENCOUNTER — Telehealth: Payer: Self-pay | Admitting: Orthopedic Surgery

## 2018-05-12 NOTE — Telephone Encounter (Signed)
Spoke with pt and she stated the dentist office told her that they have antibiotics in the office. They want to know what kind Dr. Aline Brochure would prescribed for this patient.  Please advise

## 2018-05-12 NOTE — Telephone Encounter (Signed)
Patient called to ask if she may be able to resume using her "Pro-glider" machine.  Phone [phone]

## 2018-05-12 NOTE — Telephone Encounter (Signed)
Spoke with pt and recommended that she listens to her body while using "Pro-glider". Let her know that regular exercise soreness is ok but if she experiences pain to discontinue use. Pt also mentioned that she has a dental appointment tomorrow 05/12/18 at [facility]ist with Dr. Tamala Julian. Would like to know if Dr. Aline Brochure would want her to take antibiotics before the procedure. States she just finished her recent round of antibiotics. Please advise.

## 2018-05-13 ENCOUNTER — Ambulatory Visit (HOSPITAL_COMMUNITY): Payer: PPO

## 2018-05-13 ENCOUNTER — Other Ambulatory Visit (HOSPITAL_COMMUNITY): Payer: PPO

## 2018-05-15 ENCOUNTER — Telehealth: Payer: Self-pay | Admitting: Gastroenterology

## 2018-05-15 NOTE — Telephone Encounter (Signed)
RECALL FOR LABS °

## 2018-05-16 ENCOUNTER — Encounter (HOSPITAL_COMMUNITY): Payer: Self-pay | Admitting: Internal Medicine

## 2018-05-16 ENCOUNTER — Inpatient Hospital Stay (HOSPITAL_COMMUNITY): Payer: PPO | Attending: Hematology | Admitting: Internal Medicine

## 2018-05-16 VITALS — BP 121/60 | HR 67 | Temp 98.0°F | Resp 16 | Wt 113.0 lb

## 2018-05-16 DIAGNOSIS — Z85118 Personal history of other malignant neoplasm of bronchus and lung: Secondary | ICD-10-CM | POA: Diagnosis not present

## 2018-05-16 DIAGNOSIS — R918 Other nonspecific abnormal finding of lung field: Secondary | ICD-10-CM

## 2018-05-16 DIAGNOSIS — D509 Iron deficiency anemia, unspecified: Secondary | ICD-10-CM

## 2018-05-16 DIAGNOSIS — M810 Age-related osteoporosis without current pathological fracture: Secondary | ICD-10-CM

## 2018-05-16 DIAGNOSIS — D500 Iron deficiency anemia secondary to blood loss (chronic): Secondary | ICD-10-CM

## 2018-05-16 DIAGNOSIS — C3492 Malignant neoplasm of unspecified part of left bronchus or lung: Secondary | ICD-10-CM

## 2018-05-16 DIAGNOSIS — R911 Solitary pulmonary nodule: Secondary | ICD-10-CM

## 2018-05-16 NOTE — Telephone Encounter (Signed)
PT was on recall for CBC and Ferritin for Dr. Oneida Alar for August 2019. Dr. Oneida Alar, please see CBC and Ferritin done on 05/06/2018 Dr. Walden Field.

## 2018-05-16 NOTE — Patient Instructions (Signed)
[facility] at [facility] Discharge Instructions  You saw Dr. Higgs today.   Thank you for choosing [facility] at [facility] to provide your oncology and hematology care.  To afford each patient quality time with our provider, please arrive at least 15 minutes before your scheduled appointment time.   If you have a lab appointment with the Cancer Center please come in thru the  Main Entrance and check in at the main information desk  You need to re-schedule your appointment should you arrive 10 or more minutes late.  We strive to give you quality time with our providers, and arriving late affects you and other patients whose appointments are after yours.  Also, if you no show three or more times for appointments you may be dismissed from the clinic at the providers discretion.     Again, thank you for choosing [facility].  Our hope is that these requests will decrease the amount of time that you wait before being seen by our physicians.       _____________________________________________________________  Should you have questions after your visit to [facility], please contact our office at [phone] between the hours of 8:00 a.m. and 4:30 p.m.  Voicemails left after 4:00 p.m. will not be returned until the following business day.  For prescription refill requests, have your pharmacy contact our office and allow 72 hours.    Cancer Center Support Programs:   > Cancer Support Group  2nd Tuesday of the month 1pm-2pm, Journey Room    

## 2018-05-16 NOTE — Progress Notes (Signed)
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 Stage IB bronchoalveolar carcinoma,. Well-differentiated the left lower lobe status post resection on 12/21/2010.   She had CT of chest done 05/06/2018 that was reviewed and showed  IMPRESSION: Stable postsurgical finding from left lower lobe wedge resection. No finding to suggest local recurrence or metastatic disease.  Stable 4 mm ground-glass nodule in the posterior aspect of the superior segment of the right lower lobe. Attention on future follow-up.  Moderate to advanced COPD.  Slight increase in pericardial effusion versus pericardial thickening. Small amount of free fluid in the anterior mediastinum/superior pericardial recess.  Stable compression fractures of T6, T8, T12 and L1 vertebral bodies.  5 mm nonobstructive calculus in the right kidney.  Aortic Atherosclerosis (ICD10-I70.0) and Emphysema (ICD10-J43.9).  I have discussed with her she will be set up for CT chest in 10/2018 for ongoing follow-up of pulmonary nodule.  She denies any cardiac history.   2, Pulmonary nodule.  Pt is set up for CT chest in 10/2018 for ongoing follow-up of pulmonary nodule.    3.  Pericardial effusion.  This was noted on CT.  She denies any cardiac history. She denies any cardiac symptoms today.    4   Iron  deficiency anemia.  Pt was last treated with IV iron in February 2019.  Labs done 05/06/2018 showed WBC 7.6 HB 13.8  plts 319,000.  Chemistries WNL with Cr 0.52, LFTS WNL, K+ 4.2.  Ferritin is 59. She desires to continue oral iron.  She has also been seen by GI and had upper EGD and Givens Capsule study done on 02/24/2018 that showed distal esophageal webs that were dilated, mild gastritis and mild pyloric stenosis.  She also had a mild duodenal deformity noted.  Follow-up with Dr. Oneida Alar as directed.  She will have repeat labs in 10/2018.    5.  Osteoporosis.  This was noted on Dexa done 06/25/2017.  She was receiving Prolia at Dr. Samella Parr office.  She desires to determine if she can get Prolia at [facility].  She is recommended for Prolia 60 mg every 6 months.  She will be set up for Dexa in 06/2019.  She should continue Calcium and vitamin D while on Prolia.    6.  Hyponatremia.  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  Sodium 132.  Follow-up with Dr. Dennard Schaumann.    7.  Health maintenance.  Continue to follow-up with PCP, GI as directed.  Continue mammogram screenings as recommended.    Interval History:  Historical data obtained from the note dated 03/05/2018.  Pt is a 77 year old female previously followed by Dr. Talbert Cage for Stage IB bronchoalveolar carcinoma,. Well-differentiated the left lower lobe status post resection on 12/21/2010. She had 14 lymph nodes negative and no perineural space invasion with clear margins and no LV I.   Current Status:  Pt is seen today for follow-up.  She is here to go over labs and scans.  She reports recent  right knee surgery.  She would like to get Prolia at this office if possible.      Adenocarcinoma of lung ([place])   12/21/2010 Initial Diagnosis    S/P left lower lobe resection for a well-differentiated Stage IB bronchoalveolar cancer. 0/14 lymph nodes.  No perineural invasion or LVI.  negative margins.    06/05/2016 Imaging    Although the previously described ground-glass attenuation  nodule in the superior segment of the right lower lobe measures slightly larger on today's examination than prior studies, this is favored to be technique related. Today's study should serve as a baseline for future followup examinations. At this time, there is no central solid component to this lesion. No new nodules are noted. 2. Stable postoperative scarring in the left lower lobe and post infectious scarring throughout the visualize lung bases, as above. 3. Areas of cylindrical bronchiectasis throughout the lung bases bilaterally     05/09/2017 Imaging    IMPRESSION: 1. Status post left lower lobe wedge resection. No findings to suggest local recurrence of disease or definite metastatic disease in the thorax. Previously noted ground-glass attenuation nodule in the superior segment of the right lower lobe appears slightly smaller than the prior study, suggesting a benign etiology. 2. Diffuse bronchial wall thickening with moderate to severe centrilobular and paraseptal emphysema; imaging findings suggestive of underlying COPD. 3. Aortic atherosclerosis, in addition to left main and left circumflex coronary artery disease. Assessment for potential risk factor modification, dietary therapy or pharmacologic therapy may be warranted, if clinically indicated. 4. Increasing anterior pericardial fluid and/or thickening. This is unlikely to be of hemodynamic significance at this time. No associated pericardial calcification. 5. New (but non acute) compression fracture of T6 with approximately 90% loss of anterior vertebral body height, in addition to multiple other chronic compression fractures of the visualize the thoracolumbar spine.      Problem List Patient Active Problem List   Diagnosis Date Noted  . S/P ORIF (open reduction internal fixation) fracture right patella 03/18/18 [Z96.7, Z87.81] 03/26/2018  . Cellulitis, wound, post-operative [T81.49XA] 03/22/2018  . Fracture of  right patella [S82.001A]   . Normocytic anemia [D64.9]   . Esophageal dysphagia [R13.10] 11/27/2017  . Heme positive stool [R19.5] 11/27/2017  . Iron deficiency anemia [D50.9] 12/05/2016  . SIADH (syndrome of inappropriate ADH production) ([place]) [E22.2]   . Polypharmacy [Z79.899] 09/14/2016  . Muscle weakness (generalized) [M62.81]   . Syncope [R55] 09/10/2016  . Rib pain on left side [R07.81]   . Hypersomnia [G47.10] 02/16/2016  . Acute bronchitis [J20.9] 02/16/2016  . Exertional dyspnea [R06.09] 12/20/2015  . HLD (hyperlipidemia) [E78.5] 11/23/2014  . Change in bowel habits [R19.4] 11/10/2014  . Abdominal pain, left lower quadrant [R10.32] 11/10/2014  . Emphysema lung ([place]) [J43.9] 11/08/2014  . Helicobacter pylori gastritis [K29.70, B96.81] 07/30/2014  . Acute on chronic respiratory failure with hypoxia ([place]) [J96.21] 04/10/2014  . Wrist fracture [S62.109A] 04/09/2014  . Occlusion and stenosis of carotid artery without mention of cerebral infarction [I65.29] 03/16/2014  . T12 burst fracture ([place]) [I71.245Y] 01/13/2014  . Constipation [K59.00] 12/14/2013  . Acute respiratory failure with hypoxia ([place]) [J96.01] 12/14/2013  . COPD (chronic obstructive pulmonary disease) ([place]) [J44.9] 12/14/2013  . Compression fracture of thoracic vertebra ([place]) [S22.000A] 12/14/2013  . Hypoxia [R09.02] 12/13/2013  . Compression fracture [IMO0002] 12/13/2013  . Diastolic dysfunction [K99.83]   . 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 Abdominal aneurysm without mention of rupture [I71.4] 02/26/2012  . Dyspepsia [R10.13] 02/11/2012  .  Aneurysm ([place]) [I72.9] 02/11/2012  . Trigeminal neuralgia [G50.0]   . Epigastric pain [R10.13] 02/21/2011  . Dysphagia [R13.10] 02/21/2011  . Adenocarcinoma of lung ([place]) [C34.90] 12/07/2010  . HELICOBACTER PYLORI GASTRITIS, HX OF [Z87.19] 12/30/2009  . CONTRAST DYE ALLERGY [Z91.041] 12/30/2009    Past Medical History Past Medical History:  Diagnosis Date   . Adenocarcinoma of lung ([place]) 12/2010   left lung/surg only  . Allergic rhinitis   . 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 Anxiety disorder   . Aortic aneurysm ([place])   . Complication of anesthesia 2011   bloodpressure dropped during colonoscopy, not problems since  . Compression fracture   . COPD (chronic obstructive pulmonary disease) ([place])   . Depression   . Diastolic dysfunction   . Diverticulosis   . Dysphagia   . Emphysema lung ([place]) 11/08/2014  . Hiatal hernia   . Hypothyroidism   . IBS (irritable bowel syndrome)   . Iron deficiency anemia due to chronic blood loss 12/05/2016  . On home O2 12/15/2013   chronic hypoxia  . PUD (peptic ulcer disease)    61yr  . Shortness of breath    with exertion  . SIADH (syndrome of inappropriate ADH production) ([place]   . Trigeminal neuralgia     Past Surgical History Past Surgical History:  Procedure Laterality Date  . ABDOMINAL HYSTERECTOMY    . BACK SURGERY  January 13, 2014  . CATARACT EXTRACTION    . CATARACT EXTRACTION W/PHACO  09/02/2012   Procedure: CATARACT EXTRACTION PHACO AND INTRAOCULAR LENS PLACEMENT (IOC);  Surgeon: MElta GuadeloupeT. SGershon Crane MD;  Location: [facility];  Service: Ophthalmology;  Laterality: Left;  CDE:10.35  . CHOLECYSTECTOMY    . 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 COLONOSCOPY N/A 11/23/2014   Dr. FOneida Alar moderate diverticulosis, hemorrhoids, redundant colon. next TCS in 10-15 years.   .Marland KitchenENDARTERECTOMY Right 08/21/2013   Procedure: RIGHT CAROTID ANEURYSM RESECTION;  Surgeon: TRosetta Posner MD;  [facility] [facility]  Service: Vascular;  Laterality: Right;  . ESOPHAGEAL DILATION  02/24/2018   Procedure: ESOPHAGEAL DILATION;  Surgeon: FDanie Binder MD;  [facility];  Service: Endoscopy;;  . ESOPHAGOGASTRODUODENOSCOPY  11/13/2009   w/dilation to 169m gastric ulceration (H.Pylori) s/p treatment  . ESOPHAGOGASTRODUODENOSCOPY  11/21/2009    distal esophageal web, gastritis  . ESOPHAGOGASTRODUODENOSCOPY  03/14/12   SLRCV:ELFYBOFBPn the distal esophagus/Mild gastritis/small HH. + H.pylori, prescribed Pylera. Finished treatment.   . ESOPHAGOGASTRODUODENOSCOPY N/A 12/13/2017   Procedure: ESOPHAGOGASTRODUODENOSCOPY (EGD);  Surgeon: FiDanie BinderMD;  [facility];  Service: Endoscopy;  Laterality: N/A;  8:30am  . ESOPHAGOGASTRODUODENOSCOPY N/A 02/24/2018   Procedure: ESOPHAGOGASTRODUODENOSCOPY (EGD);  Surgeon: FiDanie BinderMD;  [facility];  Service: Endoscopy;  Laterality: N/A;  11:00am  . fatty tumor removal from lt groin    . FRACTURE SURGERY Left    hand and left ring finger  . GIVENS CAPSULE STUDY N/A 12/26/2017   Procedure: GIVENS CAPSULE STUDY;  Surgeon: FiDanie BinderMD;  [facility];  Service: Endoscopy;  Laterality: N/A;  7:30am  . GIVENS CAPSULE STUDY N/A 02/24/2018   Procedure: GIVENS CAPSULE STUDY;  Surgeon: FiDanie BinderMD;  [facility];  Service: Endoscopy;  Laterality: N/A;  . KNEE SURGERY     patella tendon repair june 2019 [facility]  . LUNG CANCER SURGERY  12/2010   Left  VATS, minithoracotomy, LLL superior segmentectomy  . ORIF PATELLA Right 03/18/2018   Procedure: OPEN REDUCTION INTERNAL (ORIF) FIXATION RIGHT PATELLA;  Surgeon: Carole Civil, MD;  Location: [facility];  Service: Orthopedics;  Laterality: Right;  . ORIF WRIST FRACTURE Left 04/09/2014   Procedure: OPEN REDUCTION INTERNAL FIXATION (ORIF) WRIST FRACTURE;  Surgeon: Renette Butters, MD;  Location: [place];  Service: Orthopedics;  Laterality: Left;  . PERCUTANEOUS PINNING Left 04/09/2014   Procedure: PERCUTANEOUS PINNING EXTREMITY;  Surgeon: Renette Butters, MD;  Location: [place];  Service: Orthopedics;  Laterality: Left;  . SAVORY DILATION N/A 12/13/2017   Procedure: SAVORY DILATION;  Surgeon: Danie Binder, MD;  [facility];  Service: Endoscopy;  Laterality: N/A;  . TUBAL LIGATION    .  vocal cord surgery  02/06/2011   laryngoscopy with bilateral vocal cord Radiesse injection for vocal cord paralysis  . YAG LASER APPLICATION Left 33/54/5625   Procedure: YAG LASER APPLICATION;  Surgeon: Rutherford Guys, MD;  Location: [facility];  Service: Ophthalmology;  Laterality: Left;    Family History Family History  Problem Relation Age of Onset  . Pulmonary embolism Mother   . Heart attack Father   . Hypertension Father   . Liver cancer Sister   . Cancer Sister   . Cancer Brother   . Cancer Daughter   . Colon cancer Neg Hx      Social History  reports that she quit smoking about 7 years ago. Her smoking use included cigarettes. She has a 10.00 pack-year smoking history. She has never used smokeless tobacco. She reports that she drinks alcohol. She reports that she does not use drugs.  Medications  Current Outpatient Medications:  .  acetaminophen (TYLENOL) 325 MG tablet, Take 650 mg by mouth every 6 (six) hours as needed for moderate pain or headache. , Disp: , Rfl:  .  ALPRAZolam (XANAX) 0.5 MG tablet, TAKE (1) TABLET BY MOUTH TWICE A DAY AS NEEDED., Disp: 60 tablet, Rfl: 0 .  Calcium-Magnesium-Vitamin D (CITRACAL CALCIUM+D) 600-40-500 MG-MG-UNIT TB24, Take 1 tablet by mouth daily., Disp: , Rfl:  .  cephALEXin (KEFLEX) 500 MG capsule, Take 1 capsule (500 mg total) by mouth 2 (two) times daily., Disp: 28 capsule, Rfl: 0 .  citalopram (CELEXA) 40 MG tablet, Take 0.5 tablets (20 mg total) by mouth at bedtime., Disp: 90 tablet, Rfl: 4 .  cyclobenzaprine (FLEXERIL) 10 MG tablet, Take 1 tablet (10 mg total) by mouth every 8 (eight) hours as needed for muscle spasms., Disp: 60 tablet, Rfl: 0 .  ferrous sulfate 325 (65 FE) MG tablet, Take 325 mg by mouth daily with breakfast., Disp: , Rfl:  .  ibuprofen (ADVIL,MOTRIN) 200 MG tablet, Take 200 mg by mouth every 6 (six) hours as needed for headache or mild pain., Disp: , Rfl:  .  levothyroxine (SYNTHROID, LEVOTHROID) 50 MCG tablet, Take 1  tablet (50 mcg total) by mouth daily before breakfast., Disp: 90 tablet, Rfl: 3 .  lubiprostone (AMITIZA) 24 MCG capsule, Take 1 capsule (24 mcg total) by mouth 2 (two) times daily with a meal., Disp: 180 capsule, Rfl: 3 .  magnesium oxide (MAG-OX) 400 MG tablet, Take 400 mg by mouth every evening. , Disp: , Rfl:  .  Multiple Vitamin (MULTIVITAMIN WITH MINERALS) TABS tablet, Take 1 tablet by mouth daily., Disp: , Rfl:  .  omeprazole (PRILOSEC) 20 MG capsule, 1 PO 30 MINS PRIOR TO BREAKFAST. (Patient taking differently: Take 20 mg by mouth daily. ),  Disp: 90 capsule, Rfl: 3 .  umeclidinium-vilanterol (ANORO ELLIPTA) 62.5-25 MCG/INH AEPB, Inhale 1 puff into the lungs daily., Disp: 90 each, Rfl: 3  Allergies Ciprofloxacin hcl; Iohexol; Prozac [fluoxetine hcl]; and Sulfonamide derivatives  Review of Systems Review of Systems - Oncology ROS negative other than trouble swallowing and chewing problems due to dentures.     Physical Exam  Vitals Wt Readings from Last 3 Encounters:  05/16/18 113 lb (51.3 kg)  05/06/18 113 lb (51.3 kg)  04/30/18 111 lb (50.3 kg)   Temp Readings from Last 3 Encounters:  05/16/18 98 F (36.7 C) (Oral)  04/30/18 98.5 F (36.9 C) (Temporal)  04/24/18 99.6 F (37.6 C) (Tympanic)   BP Readings from Last 3 Encounters:  05/16/18 121/60  05/06/18 128/84  04/30/18 130/73   Pulse Readings from Last 3 Encounters:  05/16/18 67  05/06/18 83  04/30/18 78   Constitutional: Well-developed, well-nourished, and in no distress.   HENT: Head: Normocephalic and atraumatic.  Mouth/Throat: No oropharyngeal exudate. Mucosa moist. Eyes: Pupils are equal, round, and reactive to light. Conjunctivae are normal. No scleral icterus.  Neck: Normal range of motion. Neck supple. No JVD present.  Cardiovascular: Normal rate, regular rhythm and normal heart sounds.  Exam reveals no gallop and no friction rub.   No murmur heard. Pulmonary/Chest: Effort normal and breath sounds  normal. No respiratory distress. No wheezes.No rales.  Abdominal: Soft. Bowel sounds are normal. No distension. There is no tenderness. There is no guarding.  Musculoskeletal: No edema.  Right knee brace noted.  Pt uses walker.   Lymphadenopathy: No cervical, axillary or supraclavicular adenopathy.  Neurological: Alert and oriented to person, place, and time. No cranial nerve deficit.  Skin: Skin is warm and dry. No rash noted. No erythema. No pallor.  Psychiatric: Affect and judgment normal.   Labs No visits with results within 3 Day(s) from this visit.  Latest known visit with results is:  Appointment on 05/06/2018  Component Date Value Ref Range Status  . WBC 05/06/2018 7.6  4.0 - 10.5 K/uL Final  . RBC 05/06/2018 4.57  3.87 - 5.11 MIL/uL Final  . Hemoglobin 05/06/2018 13.8  12.0 - 15.0 g/dL Final  . HCT 05/06/2018 41.1  36.0 - 46.0 % Final  . MCV 05/06/2018 89.9  78.0 - 100.0 fL Final  . MCH 05/06/2018 30.2  26.0 - 34.0 pg Final  . MCHC 05/06/2018 33.6  30.0 - 36.0 g/dL Final  . RDW 05/06/2018 12.8  11.5 - 15.5 % Final  . Platelets 05/06/2018 319  150 - 400 K/uL Final  . Neutrophils Relative % 05/06/2018 70  % Final  . Neutro Abs 05/06/2018 5.3  1.7 - 7.7 K/uL Final  . Lymphocytes Relative 05/06/2018 21  % Final  . Lymphs Abs 05/06/2018 1.6  0.7 - 4.0 K/uL Final  . Monocytes Relative 05/06/2018 7  % Final  . Monocytes Absolute 05/06/2018 0.5  0.1 - 1.0 K/uL Final  . Eosinophils Relative 05/06/2018 2  % Final  . Eosinophils Absolute 05/06/2018 0.2  0.0 - 0.7 K/uL Final  . Basophils Relative 05/06/2018 0  % Final  . Basophils Absolute 05/06/2018 0.0  0.0 - 0.1 K/uL Final   Performed at [facility], [address] [place], [place] [address]  . Sodium 05/06/2018 132* 135 - 145 mmol/L Final  . Potassium 05/06/2018 4.2  3.5 - 5.1 mmol/L Final  . Chloride 05/06/2018 95* 98 - 111 mmol/L Final  . CO2 05/06/2018 30  22 -  32 mmol/L Final  . Glucose, Bld 05/06/2018 83  70 - 99 mg/dL  Final  . BUN 05/06/2018 12  8 - 23 mg/dL Final  . Creatinine, Ser 05/06/2018 0.52  0.44 - 1.00 mg/dL Final  . Calcium 05/06/2018 9.9  8.9 - 10.3 mg/dL Final  . Total Protein 05/06/2018 7.8  6.5 - 8.1 g/dL Final  . Albumin 05/06/2018 4.6  3.5 - 5.0 g/dL Final  . AST 05/06/2018 18  15 - 41 U/L Final  . ALT 05/06/2018 18  0 - 44 U/L Final  . Alkaline Phosphatase 05/06/2018 46  38 - 126 U/L Final  . Total Bilirubin 05/06/2018 0.5  0.3 - 1.2 mg/dL Final  . GFR calc non Af Amer 05/06/2018 >60  >60 mL/min Final  . GFR calc Af Amer 05/06/2018 >60  >60 mL/min Final   Comment: (NOTE) The eGFR has been calculated using the CKD EPI equation. This calculation has not been validated in all clinical situations. eGFR's persistently <60 mL/min signify possible Chronic Kidney Disease.   Georgiann Hahn gap 05/06/2018 7  5 - 15 Final   Performed at [facility], [address], [place], [place] [address]  . LDH 05/06/2018 136  98 - 192 U/L Final   Performed at [facility], [address] [place], [place] [address]  . Ferritin 05/06/2018 59  11 - 307 ng/mL Final   Performed at [facility] [address] [place], [place] [address]     Pathology Orders Placed This Encounter  Procedures  . CT Chest Wo Contrast    Standing Status:   Future    Standing Expiration Date:   05/15/2020    Order Specific Question:   Preferred imaging location?    Answer:   [facility]    Order Specific Question:   Radiology Contrast Protocol - do NOT remove file path    Answer:   [email or web address]  . CBC with Differential/Platelet    Standing Status:   Future    Standing Expiration Date:   05/16/2020  . Comprehensive metabolic panel    Standing Status:   Future    Standing Expiration Date:   05/16/2020  . Lactate dehydrogenase    Standing Status:   Future    Standing Expiration Date:   05/16/2020  . Ferritin    Standing Status:   Future    Standing Expiration Date:   05/16/2020        Zoila Shutter MD

## 2018-05-21 ENCOUNTER — Telehealth: Payer: Self-pay | Admitting: Orthopedic Surgery

## 2018-05-21 NOTE — Telephone Encounter (Signed)
PLEASE CALL PT. Her blood count and ferritin are normal. She does not need repeat labs in AUG 2019.

## 2018-05-21 NOTE — Telephone Encounter (Signed)
Got message off voicemail from patient asking that you call her. She has some questions.  Please call and advise.

## 2018-05-21 NOTE — Telephone Encounter (Signed)
I called pt she wants flu shot wants to know if we do them here, I told her we do not.

## 2018-05-22 ENCOUNTER — Telehealth: Payer: Self-pay

## 2018-05-22 NOTE — Telephone Encounter (Signed)
Tried multiple times to do PA in Cover My Meds for Trulance 3 mg once a day.  Kept getting error and eligibility can't be verified.  I called [phone] Cover My Meds and was given more info to call. I called [facility] @ [phone] and asked to do verbal PA. I spoke to Judson Roch who took all info and transferred me to pharmacy, [facility].  THAO completed the info for the PA and approved but said he was not sure how long the plan would allow. I will receive a fax with the info.  Case # [id number]. ( For future reference: pt said she has tried the following meds from PCP.) Linzess 72 mcg, 145 mg and 290 mcg. Amitiza, miralax and polyethylene. Not any of them worked.

## 2018-05-22 NOTE — Telephone Encounter (Signed)
LMOM to call.

## 2018-05-22 NOTE — Telephone Encounter (Signed)
Pt is aware.  

## 2018-05-29 NOTE — Progress Notes (Signed)
Admission is not a global period and can be charged

## 2018-05-30 ENCOUNTER — Ambulatory Visit (INDEPENDENT_AMBULATORY_CARE_PROVIDER_SITE_OTHER): Payer: PPO | Admitting: Orthopedic Surgery

## 2018-05-30 ENCOUNTER — Encounter: Payer: Self-pay | Admitting: Orthopedic Surgery

## 2018-05-30 ENCOUNTER — Ambulatory Visit (INDEPENDENT_AMBULATORY_CARE_PROVIDER_SITE_OTHER): Payer: PPO

## 2018-05-30 VITALS — BP 115/70 | HR 65 | Ht 65.0 in | Wt 114.0 lb

## 2018-05-30 DIAGNOSIS — Z9889 Other specified postprocedural states: Secondary | ICD-10-CM

## 2018-05-30 DIAGNOSIS — Z8781 Personal history of (healed) traumatic fracture: Secondary | ICD-10-CM | POA: Diagnosis not present

## 2018-05-30 DIAGNOSIS — Z967 Presence of other bone and tendon implants: Secondary | ICD-10-CM

## 2018-05-30 NOTE — Progress Notes (Signed)
Postop fracture care date of surgery  Chief Complaint  Patient presents with  . Follow-up    Recheck on right patella fracture, DOI 03-18-18.    Complains of right thigh pain  Complains of prominent hardware  X-rays show fracture is healing hardware is intact her range of motion is 125 flexion 5 degrees extension she is ambulatory with a walker  She also complained of some thigh pain she has a history of disc disease prior T12 fracture osteoporosis  Recommend continue weightbearing as tolerated x-ray in 4 weeks  Encounter Diagnosis  Name Primary?  . S/P ORIF (open reduction internal fixation) fracture right patella 03/18/18 Yes

## 2018-06-02 ENCOUNTER — Telehealth: Payer: Self-pay | Admitting: General Practice

## 2018-06-02 NOTE — Telephone Encounter (Signed)
Paitent would like a copy of all operative notes starting 12/2017-current.  Please mail a copy to the patient.   Routing to [employer]

## 2018-06-03 NOTE — Telephone Encounter (Signed)
done

## 2018-06-16 ENCOUNTER — Telehealth: Payer: Self-pay | Admitting: Gastroenterology

## 2018-06-16 NOTE — Telephone Encounter (Signed)
Pt called to cancel her OV with [facility] on 9/18 due to transportation. She wanted DS to know that the trulance was helping with a cup of coffee and that she will reschedule later.

## 2018-06-17 NOTE — Telephone Encounter (Signed)
REVIEWED-NO ADDITIONAL RECOMMENDATIONS. 

## 2018-06-17 NOTE — Telephone Encounter (Signed)
Noted! FYI to Dr. Oneida Alar and to Walden Field, NP.

## 2018-06-18 NOTE — Telephone Encounter (Signed)
Noted  

## 2018-06-19 ENCOUNTER — Encounter: Payer: Self-pay | Admitting: Family Medicine

## 2018-06-19 ENCOUNTER — Telehealth: Payer: Self-pay | Admitting: Family Medicine

## 2018-06-19 MED ORDER — DENOSUMAB 60 MG/ML ~~LOC~~ SOSY: 60.0000 mg | PREFILLED_SYRINGE | SUBCUTANEOUS | 0 refills | Status: DC

## 2018-06-19 NOTE — Telephone Encounter (Signed)
Medication called/sent to requested pharmacy  

## 2018-06-19 NOTE — Telephone Encounter (Signed)
Please call in prolia to [facility].

## 2018-06-20 MED ORDER — UMECLIDINIUM-VILANTEROL 62.5-25 MCG/INH IN AEPB: 1.0000 | INHALATION_SPRAY | Freq: Every day | RESPIRATORY_TRACT | 3 refills | Status: DC

## 2018-06-20 MED ORDER — ALPRAZOLAM 0.5 MG PO TABS: ORAL_TABLET | ORAL | 0 refills | Status: DC

## 2018-06-20 NOTE — Telephone Encounter (Signed)
Pt is requesting refill on Xanax   LOV: 01/07/18  LRF:   04/15/18

## 2018-06-25 ENCOUNTER — Emergency Department (HOSPITAL_COMMUNITY)
Admission: EM | Admit: 2018-06-25 | Discharge: 2018-06-25 | Disposition: A | Discharge status: Home / Self Care | Payer: PPO | Attending: Emergency Medicine | Admitting: Emergency Medicine

## 2018-06-25 ENCOUNTER — Ambulatory Visit: Payer: PPO | Admitting: Nurse Practitioner

## 2018-06-25 ENCOUNTER — Encounter (HOSPITAL_COMMUNITY): Payer: Self-pay

## 2018-06-25 ENCOUNTER — Emergency Department (HOSPITAL_COMMUNITY): Payer: PPO

## 2018-06-25 ENCOUNTER — Other Ambulatory Visit: Payer: Self-pay

## 2018-06-25 DIAGNOSIS — R0902 Hypoxemia: Secondary | ICD-10-CM | POA: Diagnosis not present

## 2018-06-25 DIAGNOSIS — E039 Hypothyroidism, unspecified: Secondary | ICD-10-CM | POA: Diagnosis not present

## 2018-06-25 DIAGNOSIS — R55 Syncope and collapse: Secondary | ICD-10-CM | POA: Diagnosis not present

## 2018-06-25 DIAGNOSIS — J439 Emphysema, unspecified: Secondary | ICD-10-CM | POA: Diagnosis not present

## 2018-06-25 DIAGNOSIS — I10 Essential (primary) hypertension: Secondary | ICD-10-CM | POA: Diagnosis not present

## 2018-06-25 DIAGNOSIS — J449 Chronic obstructive pulmonary disease, unspecified: Secondary | ICD-10-CM | POA: Diagnosis not present

## 2018-06-25 DIAGNOSIS — Z79899 Other long term (current) drug therapy: Secondary | ICD-10-CM | POA: Insufficient documentation

## 2018-06-25 DIAGNOSIS — R531 Weakness: Secondary | ICD-10-CM

## 2018-06-25 DIAGNOSIS — Z87891 Personal history of nicotine dependence: Secondary | ICD-10-CM | POA: Insufficient documentation

## 2018-06-25 DIAGNOSIS — R110 Nausea: Secondary | ICD-10-CM | POA: Diagnosis not present

## 2018-06-25 LAB — COMPREHENSIVE METABOLIC PANEL
ALT: 17 U/L (ref 0–44)
AST: 19 U/L (ref 15–41)
Albumin: 4.9 g/dL (ref 3.5–5.0)
Alkaline Phosphatase: 38 U/L (ref 38–126)
Anion gap: 11 (ref 5–15)
BUN: 13 mg/dL (ref 8–23)
CO2: 28 mmol/L (ref 22–32)
Calcium: 9.6 mg/dL (ref 8.9–10.3)
Chloride: 94 mmol/L — ABNORMAL LOW (ref 98–111)
Creatinine, Ser: 0.54 mg/dL (ref 0.44–1.00)
GFR calc Af Amer: >60 mL/min (ref >60)
GFR calc non Af Amer: >60 mL/min (ref >60)
Glucose, Bld: 111 mg/dL — ABNORMAL HIGH (ref 70–99)
Potassium: 4.2 mmol/L (ref 3.5–5.1)
Sodium: 133 mmol/L — ABNORMAL LOW (ref 135–145)
Total Bilirubin: 0.8 mg/dL (ref 0.3–1.2)
Total Protein: 7.3 g/dL (ref 6.5–8.1)

## 2018-06-25 LAB — CBC WITH DIFFERENTIAL/PLATELET
Basophils Absolute: 0 10*3/uL (ref 0.0–0.1)
Basophils Relative: 0 %
Eosinophils Absolute: 0 10*3/uL (ref 0.0–0.7)
Eosinophils Relative: 0 %
HCT: 41.4 % (ref 36.0–46.0)
Hemoglobin: 14.3 g/dL (ref 12.0–15.0)
Lymphocytes Relative: 10 %
Lymphs Abs: 0.7 10*3/uL (ref 0.7–4.0)
MCH: 30.4 pg (ref 26.0–34.0)
MCHC: 34.5 g/dL (ref 30.0–36.0)
MCV: 87.9 fL (ref 78.0–100.0)
Monocytes Absolute: 0.2 10*3/uL (ref 0.1–1.0)
Monocytes Relative: 3 %
Neutro Abs: 6 10*3/uL (ref 1.7–7.7)
Neutrophils Relative %: 87 %
Platelets: 276 10*3/uL (ref 150–400)
RBC: 4.71 MIL/uL (ref 3.87–5.11)
RDW: 12.9 % (ref 11.5–15.5)
WBC: 6.9 10*3/uL (ref 4.0–10.5)

## 2018-06-25 LAB — URINALYSIS, ROUTINE W REFLEX MICROSCOPIC
Bilirubin Urine: NEGATIVE
Glucose, UA: NEGATIVE mg/dL
Hgb urine dipstick: NEGATIVE
Ketones, ur: 20 mg/dL — AB
Leukocytes, UA: NEGATIVE
Nitrite: NEGATIVE
Protein, ur: NEGATIVE mg/dL
Specific Gravity, Urine: 1.01 (ref 1.005–1.030)
pH: 7 (ref 5.0–8.0)

## 2018-06-25 LAB — TROPONIN I: Troponin I: <0.03 ng/mL (ref <0.03)

## 2018-06-25 MED ORDER — ONDANSETRON 4 MG PO TBDP: ORAL_TABLET | ORAL | 0 refills | Status: DC

## 2018-06-25 MED ORDER — SODIUM CHLORIDE 0.9 % IV BOLUS
500.0000 mL | Freq: Once | INTRAVENOUS | Status: AC
  Administered 2018-06-25: 500 mL via INTRAVENOUS

## 2018-06-25 NOTE — ED Triage Notes (Signed)
EMS reports at noon today pt began to feel weak and thought she was going to be incontinent of stool but did not have diarrhea.  Pt on home o2 at 2liters.  O2 sat 94% on 2 liters when ems arrived.  CBG 134, temp 99.5 tympanic and bp 130/90 standing.  EMS increased her o2 to 3liters because pt had 2 PVC's on monitor.  HR 75, bp 117/74, rr 16, 02 95% on 3L.  Sinus on monitor per ems except bundle branch block in v2

## 2018-06-25 NOTE — ED Triage Notes (Signed)
Ems administered 4mg  of zofran pta.  Pt says it has helped.

## 2018-06-25 NOTE — ED Provider Notes (Signed)
[facility] EMERGENCY DEPARTMENT Provider Note   CSN: [id number] Arrival date & time: 06/25/18  1518     History   Chief Complaint Chief Complaint  Patient presents with  . Weakness    HPI [name] is a 76 y.o. female.  Patient states that she had some nausea and weakness today she did not pass out.  The history is provided by the patient. No language interpreter was used.  Weakness  Primary symptoms include no focal weakness. This is a new problem. The current episode started less than 1 hour ago. The problem has been resolved. There was no focality noted. There has been no fever. Pertinent negatives include no shortness of breath, no chest pain and no headaches. There were no medications administered prior to arrival. Associated medical issues do not include trauma.    Past Medical History:  Diagnosis Date  . Adenocarcinoma of lung ([place]) 12/2010   left lung/surg only  . Allergic rhinitis   . 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 Anxiety disorder   . Aortic aneurysm ([place])   . Complication of anesthesia 2011   bloodpressure dropped during colonoscopy, not problems since  . Compression fracture   . COPD (chronic obstructive pulmonary disease) ([place])   . Depression   . Diastolic dysfunction   . Diverticulosis   . Dysphagia   . Emphysema lung ([place]) 11/08/2014  . Hiatal hernia   . Hypothyroidism   . IBS (irritable bowel syndrome)   . Iron deficiency anemia due to chronic blood loss 12/05/2016  . On home O2 12/15/2013   chronic hypoxia  . PUD (peptic ulcer disease)    28yrs  . Shortness of breath    with exertion  . SIADH (syndrome of inappropriate ADH production) ([place])   . Trigeminal neuralgia     Patient Active Problem List   Diagnosis Date Noted  . Osteoporosis 05/16/2018  . S/P ORIF (open reduction internal fixation) fracture right patella 03/18/18 03/26/2018  . Cellulitis, wound, post-operative 03/22/2018  . Fracture of right  patella   . Normocytic anemia   . Esophageal dysphagia 11/27/2017  . Heme positive stool 11/27/2017  . Iron deficiency anemia 12/05/2016  . SIADH (syndrome of inappropriate ADH production) ([place])   . Polypharmacy 09/14/2016  . Muscle weakness (generalized)   . Syncope 09/10/2016  . Rib pain on left side   . Hypersomnia 02/16/2016  . Acute bronchitis 02/16/2016  . Exertional dyspnea 12/20/2015  . HLD (hyperlipidemia) 11/23/2014  . Change in bowel habits 11/10/2014  . Abdominal pain, left lower quadrant 11/10/2014  . Emphysema lung ([place]) 11/08/2014  . Helicobacter pylori gastritis 07/30/2014  . Acute on chronic respiratory failure with hypoxia ([place]) 04/10/2014  . Wrist fracture 04/09/2014  . 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 T12 burst fracture ([place]) 01/13/2014  . Constipation 12/14/2013  . Acute respiratory failure with hypoxia ([place]) 12/14/2013  . COPD (chronic obstructive pulmonary disease) ([place]) 12/14/2013  . Compression fracture of thoracic vertebra ([place]) 12/14/2013  . Hypoxia 12/13/2013  . Compression fracture 12/13/2013  . Diastolic dysfunction   . 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 Dyspepsia 02/11/2012  . Aneurysm ([place]) 02/11/2012  . Trigeminal neuralgia   . Epigastric pain 02/21/2011  . Dysphagia 02/21/2011  . Adenocarcinoma of lung ([place]) 12/07/2010  . HELICOBACTER PYLORI GASTRITIS, HX OF 12/30/2009  . CONTRAST DYE  ALLERGY 12/30/2009    Past Surgical History:  Procedure Laterality Date  . ABDOMINAL HYSTERECTOMY    . BACK SURGERY  January 13, 2014  . CATARACT EXTRACTION    . CATARACT EXTRACTION W/PHACO  09/02/2012   Procedure: CATARACT EXTRACTION PHACO AND INTRAOCULAR LENS PLACEMENT (IOC);  Surgeon: Elta Guadeloupe T. Gershon Crane, MD;  Location: [facility];  Service: Ophthalmology;  Laterality: Left;  CDE:10.35  . CHOLECYSTECTOMY    . COLONOSCOPY  2011    hyperplastic polyp, 3-4 small cecal AVMs, nonbleeding  . COLONOSCOPY N/A 11/23/2014   Dr. Oneida Alar: moderate diverticulosis, hemorrhoids, redundant colon. next TCS in 10-15 years.   Marland Kitchen ENDARTERECTOMY Right 08/21/2013   Procedure: RIGHT CAROTID ANEURYSM RESECTION;  Surgeon: Rosetta Posner, MD;  Location: [place];  Service: Vascular;  Laterality: Right;  . ESOPHAGEAL DILATION  02/24/2018   Procedure: ESOPHAGEAL DILATION;  Surgeon: Danie Binder, MD;  Location[facility];  Service: Endoscopy;;  . ESOPHAGOGASTRODUODENOSCOPY  11/13/2009   w/dilation to 46mm, gastric ulceration (H.Pylori) s/p treatment  . ESOPHAGOGASTRODUODENOSCOPY  11/21/2009   distal esophageal web, gastritis  . ESOPHAGOGASTRODUODENOSCOPY  03/14/12   HWE:[id number] in the distal esophagus/Mild gastritis/small HH. + H.pylori, prescribed Pylera. Finished treatment.   . ESOPHAGOGASTRODUODENOSCOPY N/A 12/13/2017   Procedure: ESOPHAGOGASTRODUODENOSCOPY (EGD);  Surgeon: Danie Binder, MD;  [facility];  Service: Endoscopy;  Laterality: N/A;  8:30am  . ESOPHAGOGASTRODUODENOSCOPY N/A 02/24/2018   Procedure: ESOPHAGOGASTRODUODENOSCOPY (EGD);  Surgeon: Danie Binder, MD;  [facility];  Service: Endoscopy;  Laterality: N/A;  11:00am  . fatty tumor removal from lt groin    . FRACTURE SURGERY Left    hand and left ring finger  . GIVENS CAPSULE STUDY N/A 12/26/2017   Procedure: GIVENS CAPSULE STUDY;  Surgeon: Danie Binder, MD;  [facility];  Service: Endoscopy;  Laterality: N/A;  7:30am  . GIVENS CAPSULE STUDY N/A 02/24/2018   Procedure: GIVENS CAPSULE STUDY;  Surgeon: Danie Binder, MD;  [facility];  Service: Endoscopy;  Laterality: N/A;  . KNEE SURGERY     patella tendon repair june 2019 [place]  . LUNG CANCER SURGERY  12/2010   Left VATS, minithoracotomy, LLL superior segmentectomy  . ORIF PATELLA Right 03/18/2018   Procedure: OPEN REDUCTION INTERNAL (ORIF) FIXATION RIGHT PATELLA;  Surgeon:  Carole Civil, MD;  Location: [facility];  Service: Orthopedics;  Laterality: Right;  . ORIF WRIST FRACTURE Left 04/09/2014   Procedure: OPEN REDUCTION INTERNAL FIXATION (ORIF) WRIST FRACTURE;  Surgeon: Renette Butters, MD;  Location: [place];  Service: Orthopedics;  Laterality: Left;  . PERCUTANEOUS PINNING Left 04/09/2014   Procedure: PERCUTANEOUS PINNING EXTREMITY;  Surgeon: Renette Butters, MD;  Location: [place];  Service: Orthopedics;  Laterality: Left;  . SAVORY DILATION N/A 12/13/2017   Procedure: SAVORY DILATION;  Surgeon: Danie Binder, MD;  [facility];  Service: Endoscopy;  Laterality: N/A;  . TUBAL LIGATION    . vocal cord surgery  02/06/2011   laryngoscopy with bilateral vocal cord Radiesse injection for vocal cord paralysis  . YAG LASER APPLICATION Left 89/38/1017   Procedure: YAG LASER APPLICATION;  Surgeon: Rutherford Guys, MD;  Location: [facility];  Service: Ophthalmology;  Laterality: Left;     OB History    Gravida  2   Para  2   Term  2   Preterm      AB      Living  2     SAB  TAB      Ectopic      Multiple      Live Births               Home Medications    Prior to Admission medications   Medication Sig Start Date End Date Taking? Authorizing Provider  acetaminophen (TYLENOL) 325 MG tablet Take 650 mg by mouth every 6 (six) hours as needed for moderate pain or headache.    Yes [provider]  ALPRAZolam (XANAX) 0.5 MG tablet TAKE (1) TABLET BY MOUTH TWICE A DAY AS NEEDED. Patient taking differently: Take 0.5 mg by mouth at bedtime. TAKE (1) TABLET BY MOUTH TWICE A DAY AS NEEDED. 06/20/18  Yes Dena Billet B, PA-C  Calcium-Magnesium-Vitamin D (CITRACAL CALCIUM+D) 600-40-500 MG-MG-UNIT TB24 Take 1 tablet by mouth daily.   Yes [provider]  citalopram (CELEXA) 40 MG tablet Take 0.5 tablets (20 mg total) by mouth at bedtime. 01/17/18  Yes [name], MD  cyclobenzaprine (FLEXERIL) 10 MG tablet Take 1 tablet (10 mg  total) by mouth every 8 (eight) hours as needed for muscle spasms. 03/20/18  Yes Carole Civil, MD  denosumab (PROLIA) 60 MG/ML SOSY injection Inject 60 mg into the skin every 6 (six) months. 06/19/18  Yes [name], MD  ferrous sulfate 325 (65 FE) MG tablet Take 325 mg by mouth daily with breakfast.   Yes [provider]  ibuprofen (ADVIL,MOTRIN) 200 MG tablet Take 200 mg by mouth every 6 (six) hours as needed for headache or mild pain.   Yes [provider]  levothyroxine (SYNTHROID, LEVOTHROID) 50 MCG tablet Take 1 tablet (50 mcg total) by mouth daily before breakfast. 01/17/18  Yes [name], MD  magnesium oxide (MAG-OX) 400 MG tablet Take 400 mg by mouth every evening.    Yes [provider]  Multiple Vitamin (MULTIVITAMIN WITH MINERALS) TABS tablet Take 1 tablet by mouth daily.   Yes [provider]  omeprazole (PRILOSEC) 20 MG capsule 1 PO 30 MINS PRIOR TO BREAKFAST. Patient taking differently: Take 20 mg by mouth daily.  12/13/17  Yes Fields, Sandi L, MD  TRULANCE 3 MG TABS Take 3 mg by mouth every morning. 05/23/18  Yes [provider]  umeclidinium-vilanterol (ANORO ELLIPTA) 62.5-25 MCG/INH AEPB Inhale 1 puff into the lungs daily. 06/20/18  Yes [name], MD  cephALEXin (KEFLEX) 500 MG capsule Take 1 capsule (500 mg total) by mouth 2 (two) times daily. Patient not taking: Reported on 05/30/2018 04/24/18   Carole Civil, MD  ondansetron (ZOFRAN ODT) 4 MG disintegrating tablet 4mg  ODT q4 hours prn nausea/vomit 06/25/18   Milton Ferguson, MD    Family History Family History  Problem Relation Age of Onset  . Pulmonary embolism Mother   . Heart attack Father   . Hypertension Father   . Liver cancer Sister   . Cancer Sister   . Cancer Brother   . Cancer Daughter   . Colon cancer Neg Hx     Social History Social History   Tobacco Use  . Smoking status: Former Smoker    Packs/day: 0.50    Years: 20.00    Pack  years: 10.00    Types: Cigarettes    Last attempt to quit: 12/21/2010    Years since quitting: 7.5  . Smokeless tobacco: Never Used  . Tobacco comment: smoking cessation info given and reviewed   Substance Use Topics  . Alcohol use: Yes  Alcohol/week: 0.0 standard drinks    Comment: seldom  . Drug use: No     Allergies   Ciprofloxacin hcl; Iohexol; Prozac [fluoxetine hcl]; and Sulfonamide derivatives   Review of Systems Review of Systems  Constitutional: Negative for appetite change and fatigue.  HENT: Negative for congestion, ear discharge and sinus pressure.   Eyes: Negative for discharge.  Respiratory: Negative for cough and shortness of breath.   Cardiovascular: Negative for chest pain.  Gastrointestinal: Negative for abdominal pain and diarrhea.  Genitourinary: Negative for frequency and hematuria.  Musculoskeletal: Negative for back pain.  Skin: Negative for rash.  Neurological: Positive for weakness. Negative for focal weakness, seizures and headaches.  Psychiatric/Behavioral: Negative for hallucinations.     Physical Exam Updated Vital Signs BP 137/80   Pulse 67   Temp 98 F (36.7 C) (Oral)   Resp 14   Ht 5\' 5"  (1.651 m)   Wt 50.8 kg   SpO2 93%   BMI 18.64 kg/m   Physical Exam  Constitutional: She is oriented to person, place, and time. She appears well-developed.  HENT:  Head: Normocephalic.  Eyes: Conjunctivae and EOM are normal. No scleral icterus.  Neck: Neck supple. No thyromegaly present.  Cardiovascular: Normal rate and regular rhythm. Exam reveals no gallop and no friction rub.  No murmur heard. Pulmonary/Chest: No stridor. She has no wheezes. She has no rales. She exhibits no tenderness.  Abdominal: She exhibits no distension. There is no tenderness. There is no rebound.  Musculoskeletal: Normal range of motion. She exhibits no edema.  Lymphadenopathy:    She has no cervical adenopathy.  Neurological: She is oriented to person, place, and  time. She exhibits normal muscle tone. Coordination normal.  Skin: No rash noted. No erythema.  Psychiatric: She has a normal mood and affect. Her behavior is normal.     ED Treatments / Results  Labs (all labs ordered are listed, but only abnormal results are displayed) Labs Reviewed  COMPREHENSIVE METABOLIC PANEL - Abnormal; Notable for the following components:      Result Value   Sodium 133 (*)    Chloride 94 (*)    Glucose, Bld 111 (*)    All other components within normal limits  URINALYSIS, ROUTINE W REFLEX MICROSCOPIC - Abnormal; Notable for the following components:   APPearance CLOUDY (*)    Ketones, ur 20 (*)    All other components within normal limits  CBC WITH DIFFERENTIAL/PLATELET  TROPONIN I    EKG None  Radiology Dg Abd Acute W/chest  Result Date: 06/25/2018 CLINICAL DATA:  Weakness diarrhea EXAM: DG ABDOMEN ACUTE W/ 1V CHEST COMPARISON:  CT 05/06/2018, radiograph 01/03/2018 FINDINGS: Single-view chest demonstrates bibasilar scarring. No acute opacity or significant pleural effusion. Stable mild cardiomegaly. No pneumothorax. Emphysematous disease. Supine and upright views of the abdomen demonstrate posterior rods and fixating screws in the thoracolumbar spine. No free air beneath the diaphragm. Mild gaseous prominence of colon with moderate fecal impaction at the rectum. No dilated small bowel. Surgical clips in the right upper quadrant. IMPRESSION: 1. Emphysematous disease with bibasilar scarring.  Mild cardiomegaly 2. Mild gaseous dilatation of colon without obstructive pattern. Moderate formed feces at the rectum. Electronically Signed   By: Donavan Foil M.D.   On: 06/25/2018 17:26    Procedures Procedures (including critical care time)  Medications Ordered in ED Medications  sodium chloride 0.9 % bolus 500 mL (0 mLs Intravenous Stopped 06/25/18 1708)     Initial Impression / Assessment and Plan /  ED Course  I have reviewed the triage vital signs and  the nursing notes.  Pertinent labs & imaging results that were available during my care of the patient were reviewed by me and considered in my medical decision making (see chart for details).     Labs and x-rays and EKG unremarkable.  Patient instructed to stay hydrated and is given some Zofran.  She will follow-up with her primary care doctor Final Clinical Impressions(s) / ED Diagnoses   Final diagnoses:  Weakness    ED Discharge Orders         Ordered    ondansetron (ZOFRAN ODT) 4 MG disintegrating tablet     06/25/18 1829           Milton Ferguson, MD 06/25/18 1832

## 2018-06-25 NOTE — Discharge Instructions (Addendum)
Follow-up with your family doctor in the next week for recheck

## 2018-06-27 ENCOUNTER — Telehealth: Payer: Self-pay | Admitting: Gastroenterology

## 2018-06-27 ENCOUNTER — Ambulatory Visit (INDEPENDENT_AMBULATORY_CARE_PROVIDER_SITE_OTHER): Payer: PPO | Admitting: Orthopedic Surgery

## 2018-06-27 ENCOUNTER — Ambulatory Visit (INDEPENDENT_AMBULATORY_CARE_PROVIDER_SITE_OTHER): Payer: PPO

## 2018-06-27 VITALS — BP 117/63 | HR 63 | Ht 63.0 in | Wt 114.0 lb

## 2018-06-27 DIAGNOSIS — Z967 Presence of other bone and tendon implants: Secondary | ICD-10-CM

## 2018-06-27 DIAGNOSIS — Z8781 Personal history of (healed) traumatic fracture: Secondary | ICD-10-CM | POA: Diagnosis not present

## 2018-06-27 DIAGNOSIS — Z9889 Other specified postprocedural states: Secondary | ICD-10-CM

## 2018-06-27 NOTE — Telephone Encounter (Signed)
Pt called to say that she has started taking Trulance and she is staying nauseated and feeling "yucky" and said she feels faint. Please call [phone]

## 2018-06-27 NOTE — Progress Notes (Signed)
Chief Complaint  Patient presents with  . Follow-up    Recheck on right knee, DOI 03-18-18.   3 months plus status post ORIF right patella fracture with tension band wire  X-ray shows fracture is healed patient's clinical exam shows 0 to 90 degrees of flexion her wound looks clean dry and intact there is no erythema or bruising  Recommend knee exercises over the next 3 months and when I see her next time we will address hip click  Encounter Diagnosis  Name Primary?  . S/P ORIF (open reduction internal fixation) fracture right patella 03/18/18 Yes

## 2018-06-27 NOTE — Telephone Encounter (Signed)
Spoke with pt. Pt started Trulance this week. She was taking it on an empty stomach and felt nauseated throughout the day. Pt was advised to eat breakfast and then take pill to see if this changes how she feels. Pt is also under medical care for a broken bone in her knee. Pt will f/u with that doctor today. Pt is aware that if her symptoms continue, she is to call back . Pt also knows that if she feels lightheaded, vomiting ect, she is to call EMS to go to the ED if alone.

## 2018-06-30 ENCOUNTER — Telehealth: Payer: Self-pay | Admitting: Gastroenterology

## 2018-06-30 NOTE — Telephone Encounter (Signed)
Forwarding FYI to Dr. Fields.  

## 2018-06-30 NOTE — Telephone Encounter (Signed)
PATIENT CALLED AND STATED THAT DR. FIELDS NEEDED TO FILL OUT PAPERS FROM HER SUPPLEMENTAL INSURANCE, [employer],  THEY WERE LAID ON YOUR CHAIR IN THE OFFICE.

## 2018-07-01 ENCOUNTER — Telehealth: Payer: Self-pay | Admitting: Gastroenterology

## 2018-07-01 NOTE — Telephone Encounter (Signed)
LL PRSECRIBED TRULANCE. CALL PT NEXT WEEK FOR UPDATE.

## 2018-07-01 NOTE — Telephone Encounter (Signed)
Dr. Oneida Alar, please see phone note of 05/01/2018. You had prescribed Trulance.

## 2018-07-01 NOTE — Telephone Encounter (Signed)
REVIEWED-NO ADDITIONAL RECOMMENDATIONS. 

## 2018-07-01 NOTE — Telephone Encounter (Signed)
See separate note, I have spoke to pt.

## 2018-07-01 NOTE — Telephone Encounter (Signed)
Please call patient back [phone]

## 2018-07-01 NOTE — Telephone Encounter (Signed)
REVIEWED. PLEASE CALL PT. IF SHE IS HAVING AN ADVERSE REACTION TO HER TRULANCE SHE SHOULD CALL THE PRESCRIBING PROVIDER.

## 2018-07-01 NOTE — Telephone Encounter (Signed)
PT said she stop taking the Trulance for now. She has a problem with her sodium sometimes and she will be seeing her PCP soon. She is doing OK for now and will call if problems or questions.

## 2018-07-01 NOTE — Telephone Encounter (Signed)
PLEASE CALL PT. SHE CAN PICK UP PAPERWORK OF FRI SEP 27.

## 2018-07-02 NOTE — Telephone Encounter (Signed)
Called patient and l/m that she can pick up Friday

## 2018-07-07 ENCOUNTER — Telehealth: Payer: Self-pay

## 2018-07-07 ENCOUNTER — Encounter: Payer: Self-pay | Admitting: Family Medicine

## 2018-07-07 NOTE — Telephone Encounter (Signed)
Pt called and has concerns about taking the flu shot. Said she is always worried because she has heard so many things about reactions. She said that one of Dr. Nona Dell pt's that she knows and is in the hospital now, recently had a bad reaction to the flu shot. She wants to know if Dr. Oneida Alar recommends her to have the flu shot with her GI problems. She is aware that Dr. Oneida Alar is off today and will be at the hospital tomorrow.  Dr. Oneida Alar, please advise!

## 2018-07-08 ENCOUNTER — Telehealth: Payer: Self-pay | Admitting: Gastroenterology

## 2018-07-08 NOTE — Telephone Encounter (Signed)
Pt is aware.  

## 2018-07-08 NOTE — Telephone Encounter (Signed)
Left Vm for a return call.  

## 2018-07-08 NOTE — Telephone Encounter (Signed)
See previous note, I have spoken to pt.

## 2018-07-08 NOTE — Telephone Encounter (Signed)
Patient returned call

## 2018-07-08 NOTE — Telephone Encounter (Signed)
PLEASE CALL PT. She should get the flu shot due to her age and lung disease. It will not affect her bowels. THE BENEFITS OF THE FLU SHOT FAR OUTWEIGH ANY SIDE EFFECTS/RISKS.

## 2018-07-10 ENCOUNTER — Encounter: Payer: Self-pay | Admitting: Family Medicine

## 2018-07-11 ENCOUNTER — Ambulatory Visit (INDEPENDENT_AMBULATORY_CARE_PROVIDER_SITE_OTHER): Payer: PPO | Admitting: Family Medicine

## 2018-07-11 ENCOUNTER — Ambulatory Visit: Payer: PPO

## 2018-07-11 ENCOUNTER — Encounter: Payer: Self-pay | Admitting: Family Medicine

## 2018-07-11 VITALS — BP 120/80 | HR 70 | Temp 97.7°F | Resp 16 | Ht 66.0 in | Wt 117.0 lb

## 2018-07-11 DIAGNOSIS — J449 Chronic obstructive pulmonary disease, unspecified: Secondary | ICD-10-CM

## 2018-07-11 DIAGNOSIS — Z23 Encounter for immunization: Secondary | ICD-10-CM

## 2018-07-11 DIAGNOSIS — M810 Age-related osteoporosis without current pathological fracture: Secondary | ICD-10-CM

## 2018-07-11 DIAGNOSIS — D649 Anemia, unspecified: Secondary | ICD-10-CM

## 2018-07-11 DIAGNOSIS — E871 Hypo-osmolality and hyponatremia: Secondary | ICD-10-CM

## 2018-07-11 MED ORDER — DENOSUMAB 60 MG/ML ~~LOC~~ SOSY
60.0000 mg | PREFILLED_SYRINGE | Freq: Once | SUBCUTANEOUS | Status: AC
  Administered 2018-07-11: 60 mg via SUBCUTANEOUS

## 2018-07-11 NOTE — Progress Notes (Signed)
Subjective:    Patient ID: [name], female    DOB: 08-Feb-1941, 77 y.o.   MRN: [id number]  HPI  Please see my office visit from September.  Patient had a CT scan of the chest in August 2018 to monitor for recurrence of her lung cancer.  I have copied the findings of the CT scan below:   IMPRESSION: 1. Status post left lower lobe wedge resection. No findings to suggest local recurrence of disease or definite metastatic disease in the thorax. Previously noted ground-glass attenuation nodule in the superior segment of the right lower lobe appears slightly smaller than the prior study, suggesting a benign etiology. 2. Diffuse bronchial wall thickening with moderate to severe centrilobular and paraseptal emphysema; imaging findings suggestive of underlying COPD. 3. Aortic atherosclerosis, in addition to left main and left circumflex coronary artery disease. Assessment for potential risk factor modification, dietary therapy or pharmacologic therapy may be warranted, if clinically indicated. 4. Increasing anterior pericardial fluid and/or thickening. This is unlikely to be of hemodynamic significance at this time. No associated pericardial calcification. 5. New (but non acute) compression fracture of T6 with approximately 90% loss of anterior vertebral body height, in addition to multiple other chronic compression fractures of the visualize the thoracolumbar spine.  01/2018 At that time, due to the multiple vertebral fractures, we resume Prolia and I recommended that the patient take calcium and vitamin D.  I also recommended an echocardiogram of the heart to evaluate the pericardial effusion more thoroughly.  Regarding calcifications in the left main and left circumflex coronary artery, I recommended clinical monitoring because the patient was asymptomatic at the time.  She presents today stating that she is becoming more short of breath with activity.  She used to walk to the mailbox  up a mild incline.  However she can no longer do this because of increasing shortness of breath with exertion.  She is now unable to walk her dog due to shortness of breath with activity.  Patient has severe underlying COPD/emphysema.  She is on chronic oxygen and anoro.  This is most likely the explanation however given the pericardial effusion as well as calcification in her coronary arteries, patient was concerned that there may be a component that is cardiac in nature to her shortness of breath.  She also reports atypical left-sided chest pain which does not sound anginal in nature.  At that time, my plan was: Given the patient's increasing exertional dyspnea, I believe cardiology consultation is warranted.  Most likely her dyspnea on exertion is related to deconditioning, severe COPD, and her chronic hypoxia.  However she does have noted calcification in her coronary arteries as well as a chronic idiopathic pericardial effusion.  I believe stress testing is reasonable to rule out reversible ischemia as a potential contributing factor to her dyspnea on exertion.  Regarding her hyponatremia, her most recent sodium was 132.  I have recommended restricting her fluids to less than 1200 mL a day.  This is due to her underlying SIADH.  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  I recommended 1200 mg a day of calcium despite the calcification seen on her CT scan because the benefit outweighs the risk.  Also recommended continuing vitamin D.  Regarding her COPD, she is not smoking, she is on oxygen, I recommended continuing anoro.  I do not see benefit at switching to Trelegy given the fact the patient does not have frequent exacerbations requiring prednisone.  07/11/18  Patient is here today for a six-month follow-up.  She has age-related osteoporosis and is currently receiving Prolia injections every 6 months.  She is due today to receive her Prolia injections.  She is  compliant with using calcium and vitamin D.  In April of this year, refer the patient to cardiology.  Echocardiogram showed a small residual pericardial effusion that appeared to be improving.  Ejection fraction was normal.  Stress test of the heart revealed no ischemic coronary artery disease.  It was a low risk study.  Recently patient suffered an episode of near syncope after looking up cleaning cabinets.  She went to the emergency room, sodium level was found to be low at 133 but for this patient has actually very good.  CBC showed that her hemoglobin had improved dramatically from the anemia she was experiencing earlier this year.  She is currently on iron tablets for replacement.  Hemoglobin was recently found to be 14.8.  Regarding her COPD, she is using Anoro on a daily basis.  She denies any recent COPD exacerbations.  She has not required antibiotics or prednisone in quite some time.  She seldom requires her rescue inhaler.  She has chronic oxygen dependent respiratory failure however and is on 2 L via nasal cannula consistently. Past Medical History:  Diagnosis Date  . Adenocarcinoma of lung ([place]) 12/2010   left lung/surg only  . Allergic rhinitis   . 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 Anxiety disorder   . Aortic aneurysm ([employer])   . Complication of anesthesia 2011   bloodpressure dropped during colonoscopy, not problems since  . Compression fracture   . COPD (chronic obstructive pulmonary disease) ([place])   . Depression   . Diastolic dysfunction   . Diverticulosis   . Dysphagia   . Emphysema lung ([place]) 11/08/2014  . Hiatal hernia   . Hypothyroidism   . IBS (irritable bowel syndrome)   . Iron deficiency anemia due to chronic blood loss 12/05/2016  . On home O2 12/15/2013   chronic hypoxia  . PUD (peptic ulcer disease)    52yrs  . Shortness of breath    with exertion  . SIADH (syndrome of inappropriate ADH production) ([place])   . Trigeminal neuralgia     Past Surgical History:  Procedure Laterality Date  . ABDOMINAL HYSTERECTOMY    . BACK SURGERY  January 13, 2014  . CATARACT EXTRACTION    . CATARACT EXTRACTION W/PHACO  09/02/2012   Procedure: CATARACT EXTRACTION PHACO AND INTRAOCULAR LENS PLACEMENT (IOC);  Surgeon: Elta Guadeloupe T. Gershon Crane, MD;  [facility]: [facility];  Service: Ophthalmology;  Laterality: Left;  CDE:10.35  . CHOLECYSTECTOMY    . 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 COLONOSCOPY N/A 11/23/2014   Dr. Oneida Alar: moderate diverticulosis, hemorrhoids, redundant colon. next TCS in 10-15 years.   Marland Kitchen ENDARTERECTOMY Right 08/21/2013   Procedure: RIGHT CAROTID ANEURYSM RESECTION;  Surgeon: Rosetta Posner, MD;  Location: [place];  Service: Vascular;  Laterality: Right;  . ESOPHAGEAL DILATION  02/24/2018   Procedure: ESOPHAGEAL DILATION;  Surgeon: Danie Binder, MD;  [facility];  Service: Endoscopy;;  . ESOPHAGOGASTRODUODENOSCOPY  11/13/2009   w/dilation to 42mm, gastric ulceration (H.Pylori) s/p treatment  . ESOPHAGOGASTRODUODENOSCOPY  11/21/2009   distal esophageal web, gastritis  . ESOPHAGOGASTRODUODENOSCOPY  03/14/12   BPZ:WCHENIDPO in the distal esophagus/Mild gastritis/small HH. + H.pylori, prescribed Pylera. Finished treatment.   . ESOPHAGOGASTRODUODENOSCOPY  N/A 12/13/2017   Procedure: ESOPHAGOGASTRODUODENOSCOPY (EGD);  Surgeon: Danie Binder, MD;  [facility];  Service: Endoscopy;  Laterality: N/A;  8:30am  . ESOPHAGOGASTRODUODENOSCOPY N/A 02/24/2018   Procedure: ESOPHAGOGASTRODUODENOSCOPY (EGD);  Surgeon: Danie Binder, MD;  [facility];  Service: Endoscopy;  Laterality: N/A;  11:00am  . fatty tumor removal from lt groin    . FRACTURE SURGERY Left    hand and left ring finger  . GIVENS CAPSULE STUDY N/A 12/26/2017   Procedure: GIVENS CAPSULE STUDY;  Surgeon: Danie Binder, MD;  [facility];  Service: Endoscopy;  Laterality: N/A;  7:30am  . GIVENS CAPSULE  STUDY N/A 02/24/2018   Procedure: GIVENS CAPSULE STUDY;  Surgeon: Danie Binder, MD;  [facility];  Service: Endoscopy;  Laterality: N/A;  . KNEE SURGERY     patella tendon repair june 2019 [facility]  . LUNG CANCER SURGERY  12/2010   Left VATS, minithoracotomy, LLL superior segmentectomy  . ORIF PATELLA Right 03/18/2018   Procedure: OPEN REDUCTION INTERNAL (ORIF) FIXATION RIGHT PATELLA;  Surgeon: Carole Civil, MD;  [facility];  Service: Orthopedics;  Laterality: Right;  . ORIF WRIST FRACTURE Left 04/09/2014   Procedure: OPEN REDUCTION INTERNAL FIXATION (ORIF) WRIST FRACTURE;  Surgeon: Renette Butters, MD;  Location: [place];  Service: Orthopedics;  Laterality: Left;  . PERCUTANEOUS PINNING Left 04/09/2014   Procedure: PERCUTANEOUS PINNING EXTREMITY;  Surgeon: Renette Butters, MD;  Location: [place];  Service: Orthopedics;  Laterality: Left;  . SAVORY DILATION N/A 12/13/2017   Procedure: SAVORY DILATION;  Surgeon: Danie Binder, MD;  [facility];  Service: Endoscopy;  Laterality: N/A;  . TUBAL LIGATION    . vocal cord surgery  02/06/2011   laryngoscopy with bilateral vocal cord Radiesse injection for vocal cord paralysis  . YAG LASER APPLICATION Left 15/17/6160   Procedure: YAG LASER APPLICATION;  Surgeon: Rutherford Guys, MD;  [facility];  Service: Ophthalmology;  Laterality: Left;   Current Outpatient Medications on File Prior to Visit  Medication Sig Dispense Refill  . acetaminophen (TYLENOL) 325 MG tablet Take 650 mg by mouth every 6 (six) hours as needed for moderate pain or headache.     . ALPRAZolam (XANAX) 0.5 MG tablet TAKE (1) TABLET BY MOUTH TWICE A DAY AS NEEDED. (Patient taking differently: Take 0.5 mg by mouth at bedtime. TAKE (1) TABLET BY MOUTH TWICE A DAY AS NEEDED.) 60 tablet 0  . Calcium-Magnesium-Vitamin D (CITRACAL CALCIUM+D) 600-40-500 MG-MG-UNIT TB24 Take 1 tablet by mouth daily.    . citalopram (CELEXA) 40 MG tablet Take 0.5 tablets (20 mg  total) by mouth at bedtime. 90 tablet 4  . denosumab (PROLIA) 60 MG/ML SOSY injection Inject 60 mg into the skin every 6 (six) months. 1 Syringe 0  . ferrous sulfate 325 (65 FE) MG tablet Take 325 mg by mouth daily with breakfast.    . ibuprofen (ADVIL,MOTRIN) 200 MG tablet Take 200 mg by mouth every 6 (six) hours as needed for headache or mild pain.    Marland Kitchen levothyroxine (SYNTHROID, LEVOTHROID) 50 MCG tablet Take 1 tablet (50 mcg total) by mouth daily before breakfast. 90 tablet 3  . magnesium oxide (MAG-OX) 400 MG tablet Take 400 mg by mouth every evening.     . Multiple Vitamin (MULTIVITAMIN WITH MINERALS) TABS tablet Take 1 tablet by mouth daily.    Marland Kitchen omeprazole (PRILOSEC) 20 MG capsule 1 PO 30 MINS PRIOR TO BREAKFAST. (  Patient taking differently: Take 20 mg by mouth daily. ) 90 capsule 3  . ondansetron (ZOFRAN ODT) 4 MG disintegrating tablet 4mg  ODT q4 hours prn nausea/vomit 6 tablet 0  . TRULANCE 3 MG TABS Take 3 mg by mouth every morning.    . umeclidinium-vilanterol (ANORO ELLIPTA) 62.5-25 MCG/INH AEPB Inhale 1 puff into the lungs daily. 90 each 3   No current facility-administered medications on file prior to visit.    Allergies  Allergen Reactions  . Ciprofloxacin Hcl Hives  . Iohexol Other (See Comments)     Desc: Per [facility], pt is allergic to IV contrast, no type of reaction was available. Pt cannot remember but first reacted around 15 yrs ago from an IVP. Notes were date from 2009 at urology center., Onset Date: [id number]   . Prozac [Fluoxetine Hcl] Rash  . Sulfonamide Derivatives Hives and Rash   Social History   Socioeconomic History  . Marital status: Divorced    Spouse name: Not on file  . Number of children: Not on file  . Years of education: Not on file  . Highest education level: Not on file  Occupational History  . Not on file  Social Needs  . Financial resource strain: Somewhat hard  . Food insecurity:    Worry: Never true    Inability: Never true  .  Transportation needs:    Medical: No    Non-medical: No  Tobacco Use  . Smoking status: Former Smoker    Packs/day: 0.50    Years: 20.00    Pack years: 10.00    Types: Cigarettes    Last attempt to quit: 12/21/2010    Years since quitting: 7.5  . Smokeless tobacco: Never Used  . Tobacco comment: smoking cessation info given and reviewed   Substance and Sexual Activity  . Alcohol use: Yes    Alcohol/week: 0.0 standard drinks    Comment: seldom  . Drug use: No  . Sexual activity: Yes    Birth control/protection: Surgical  Lifestyle  . Physical activity:    Days per week: 0 days    Minutes per session: 0 min  . Stress: Not at all  Relationships  . Social connections:    Talks on phone: Once a week    Gets together: Never    Attends religious service: Never    Active member of club or organization: No    Attends meetings of clubs or organizations: Never    Relationship status: Divorced  . Intimate partner violence:    Fear of current or ex partner: No    Emotionally abused: No    Physically abused: No    Forced sexual activity: No  Other Topics Concern  . Not on file  Social History Narrative  . Not on file     Past Medical History:  Diagnosis Date  . Adenocarcinoma of lung ([place]) 12/2010   left lung/surg only  . Allergic rhinitis   . 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 Anxiety disorder   . Aortic aneurysm ([place])   . Complication of anesthesia 2011   bloodpressure dropped during colonoscopy, not problems since  . Compression fracture   . COPD (chronic obstructive pulmonary disease) ([place])   . Depression   . Diastolic dysfunction   . Diverticulosis   . Dysphagia   . Emphysema lung ([place]) 11/08/2014  . Hiatal hernia   . Hypothyroidism   . IBS (irritable bowel syndrome)   .  Iron deficiency anemia due to chronic blood loss 12/05/2016  . On home O2 12/15/2013   chronic hypoxia  . PUD (peptic ulcer disease)    13yrs  . Shortness of  breath    with exertion  . SIADH (syndrome of inappropriate ADH production) ([place])   . Trigeminal neuralgia    Past Surgical History:  Procedure Laterality Date  . ABDOMINAL HYSTERECTOMY    . BACK SURGERY  January 13, 2014  . CATARACT EXTRACTION    . CATARACT EXTRACTION W/PHACO  09/02/2012   Procedure: CATARACT EXTRACTION PHACO AND INTRAOCULAR LENS PLACEMENT (IOC);  Surgeon: Elta Guadeloupe T. Gershon Crane, MD;  [facility];  Service: Ophthalmology;  Laterality: Left;  CDE:10.35  . CHOLECYSTECTOMY    . 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 COLONOSCOPY N/A 11/23/2014   Dr. Oneida Alar: moderate diverticulosis, hemorrhoids, redundant colon. next TCS in 10-15 years.   Marland Kitchen ENDARTERECTOMY Right 08/21/2013   Procedure: RIGHT CAROTID ANEURYSM RESECTION;  Surgeon: Rosetta Posner, MD;  Location: [place];  Service: Vascular;  Laterality: Right;  . ESOPHAGEAL DILATION  02/24/2018   Procedure: ESOPHAGEAL DILATION;  Surgeon: Danie Binder, MD;  [facility];  Service: Endoscopy;;  . ESOPHAGOGASTRODUODENOSCOPY  11/13/2009   w/dilation to 63mm, gastric ulceration (H.Pylori) s/p treatment  . ESOPHAGOGASTRODUODENOSCOPY  11/21/2009   distal esophageal web, gastritis  . ESOPHAGOGASTRODUODENOSCOPY  03/14/12   [facility]:WFUXNATFT in the distal esophagus/Mild gastritis/small HH. + H.pylori, prescribed Pylera. Finished treatment.   . ESOPHAGOGASTRODUODENOSCOPY N/A 12/13/2017   Procedure: ESOPHAGOGASTRODUODENOSCOPY (EGD);  Surgeon: Danie Binder, MD;  [facility];  Service: Endoscopy;  Laterality: N/A;  8:30am  . ESOPHAGOGASTRODUODENOSCOPY N/A 02/24/2018   Procedure: ESOPHAGOGASTRODUODENOSCOPY (EGD);  Surgeon: Danie Binder, MD;  [facility];  Service: Endoscopy;  Laterality: N/A;  11:00am  . fatty tumor removal from lt groin    . FRACTURE SURGERY Left    hand and left ring finger  . GIVENS CAPSULE STUDY N/A 12/26/2017   Procedure: GIVENS CAPSULE STUDY;  Surgeon:  Danie Binder, MD;  [facility];  Service: Endoscopy;  Laterality: N/A;  7:30am  . GIVENS CAPSULE STUDY N/A 02/24/2018   Procedure: GIVENS CAPSULE STUDY;  Surgeon: Danie Binder, MD;  [facility];  Service: Endoscopy;  Laterality: N/A;  . KNEE SURGERY     patella tendon repair june 2019 [facility]  . LUNG CANCER SURGERY  12/2010   Left VATS, minithoracotomy, LLL superior segmentectomy  . ORIF PATELLA Right 03/18/2018   Procedure: OPEN REDUCTION INTERNAL (ORIF) FIXATION RIGHT PATELLA;  Surgeon: Carole Civil, MD;  [facility];  Service: Orthopedics;  Laterality: Right;  . ORIF WRIST FRACTURE Left 04/09/2014   Procedure: OPEN REDUCTION INTERNAL FIXATION (ORIF) WRIST FRACTURE;  Surgeon: Renette Butters, MD;  Location: [place];  Service: Orthopedics;  Laterality: Left;  . PERCUTANEOUS PINNING Left 04/09/2014   Procedure: PERCUTANEOUS PINNING EXTREMITY;  Surgeon: Renette Butters, MD;  Location: [place];  Service: Orthopedics;  Laterality: Left;  . SAVORY DILATION N/A 12/13/2017   Procedure: SAVORY DILATION;  Surgeon: Danie Binder, MD;  [facility];  Service: Endoscopy;  Laterality: N/A;  . TUBAL LIGATION    . vocal cord surgery  02/06/2011   laryngoscopy with bilateral vocal cord Radiesse injection for vocal cord paralysis  . YAG LASER APPLICATION Left 73/22/0254   Procedure: YAG LASER APPLICATION;  Surgeon: Rutherford Guys, MD;  [facility];  Service: Ophthalmology;  Laterality: Left;   Current Outpatient Medications on File Prior to Visit  Medication Sig Dispense Refill  . acetaminophen (TYLENOL) 325 MG tablet Take 650 mg by mouth every 6 (six) hours as needed for moderate pain or headache.     . ALPRAZolam (XANAX) 0.5 MG tablet TAKE (1) TABLET BY MOUTH TWICE A DAY AS NEEDED. (Patient taking differently: Take 0.5 mg by mouth at bedtime. TAKE (1) TABLET BY MOUTH TWICE A DAY AS NEEDED.) 60 tablet 0  . Calcium-Magnesium-Vitamin D (CITRACAL CALCIUM+D)  600-40-500 MG-MG-UNIT TB24 Take 1 tablet by mouth daily.    . citalopram (CELEXA) 40 MG tablet Take 0.5 tablets (20 mg total) by mouth at bedtime. 90 tablet 4  . denosumab (PROLIA) 60 MG/ML SOSY injection Inject 60 mg into the skin every 6 (six) months. 1 Syringe 0  . ferrous sulfate 325 (65 FE) MG tablet Take 325 mg by mouth daily with breakfast.    . ibuprofen (ADVIL,MOTRIN) 200 MG tablet Take 200 mg by mouth every 6 (six) hours as needed for headache or mild pain.    Marland Kitchen levothyroxine (SYNTHROID, LEVOTHROID) 50 MCG tablet Take 1 tablet (50 mcg total) by mouth daily before breakfast. 90 tablet 3  . magnesium oxide (MAG-OX) 400 MG tablet Take 400 mg by mouth every evening.     . Multiple Vitamin (MULTIVITAMIN WITH MINERALS) TABS tablet Take 1 tablet by mouth daily.    Marland Kitchen omeprazole (PRILOSEC) 20 MG capsule 1 PO 30 MINS PRIOR TO BREAKFAST. (Patient taking differently: Take 20 mg by mouth daily. ) 90 capsule 3  . ondansetron (ZOFRAN ODT) 4 MG disintegrating tablet 4mg  ODT q4 hours prn nausea/vomit 6 tablet 0  . TRULANCE 3 MG TABS Take 3 mg by mouth every morning.    . umeclidinium-vilanterol (ANORO ELLIPTA) 62.5-25 MCG/INH AEPB Inhale 1 puff into the lungs daily. 90 each 3   No current facility-administered medications on file prior to visit.    Allergies  Allergen Reactions  . Ciprofloxacin Hcl Hives  . Iohexol Other (See Comments)     Desc: Per [facility], pt is allergic to IV contrast, no type of reaction was available. Pt cannot remember but first reacted around 15 yrs ago from an IVP. Notes were date from 2009 at urology center., Onset Date: [id number]   . Prozac [Fluoxetine Hcl] Rash  . Sulfonamide Derivatives Hives and Rash   Social History   Socioeconomic History  . Marital status: Divorced    Spouse name: Not on file  . Number of children: Not on file  . Years of education: Not on file  . Highest education level: Not on file  Occupational History  . Not on file  Social  Needs  . Financial resource strain: Somewhat hard  . Food insecurity:    Worry: Never true    Inability: Never true  . Transportation needs:    Medical: No    Non-medical: No  Tobacco Use  . Smoking status: Former Smoker    Packs/day: 0.50    Years: 20.00    Pack years: 10.00    Types: Cigarettes    Last attempt to quit: 12/21/2010    Years since quitting: 7.5  . Smokeless tobacco: Never Used  . Tobacco comment: smoking cessation info given and reviewed   Substance and Sexual Activity  . Alcohol use: Yes    Alcohol/week: 0.0 standard drinks    Comment: seldom  . Drug use: No  . Sexual activity:  Yes    Birth control/protection: Surgical  Lifestyle  . Physical activity:    Days per week: 0 days    Minutes per session: 0 min  . Stress: Not at all  Relationships  . Social connections:    Talks on phone: Once a week    Gets together: Never    Attends religious service: Never    Active member of club or organization: No    Attends meetings of clubs or organizations: Never    Relationship status: Divorced  . Intimate partner violence:    Fear of current or ex partner: No    Emotionally abused: No    Physically abused: No    Forced sexual activity: No  Other Topics Concern  . Not on file  Social History Narrative  . Not on file    Review of Systems  All other systems reviewed and are negative.      Objective:   Physical Exam  Constitutional: She appears well-developed and well-nourished. No distress.  Neck: No JVD present. No thyromegaly present.  Cardiovascular: Normal rate, regular rhythm and normal heart sounds.  No murmur heard. Pulmonary/Chest: Effort normal. She has wheezes.  Abdominal: Soft. Bowel sounds are normal. She exhibits no distension. There is no tenderness. There is no rebound and no guarding.  Musculoskeletal: She exhibits no edema.  Lymphadenopathy:    She has no cervical adenopathy.  Skin: She is not diaphoretic.          Assessment &  Plan:  Hyponatremia  COPD (chronic obstructive pulmonary disease) with chronic bronchitis (HCC)  Age related osteoporosis, unspecified pathological fracture presence  Normocytic anemia  I reviewed lab work recently obtained in the emergency room.  Sodium level was stable for this patient 133 and hemoglobin had improved on iron replacement.  This is very reassuring.  From a COPD standpoint, she appears stable.  Her breathing is at her baseline.  She has not had a COPD exacerbation in the last 6 months.  She is not requiring prednisone or antibiotics.  She has not experienced an increased O2 requirement.  I did encourage the patient to gradually increase her aerobic exercise as tolerated to improve her deconditioning.  Continue to encourage a 1200 cc/day fluid restriction to manage her SIADH/hyponatremia.  Patient received her Prolia injection today.  Follow-up in 6 months or as needed

## 2018-07-11 NOTE — Addendum Note (Signed)
Addended by: Shary Decamp B on: 07/11/2018 02:54 PM   Modules accepted: Orders

## 2018-07-14 IMAGING — DX DG LUMBAR SPINE COMPLETE 4+V
5 series · 5 of 5 positions shown · non-contrast
Comparison: Lumbar spine radiographs performed 03/14/2015, and CT
of the chest performed 06/04/2016

CLINICAL DATA: Acute onset of lower back pain, after fall
backwards. Initial encounter.

EXAM:
LUMBAR SPINE - COMPLETE 4+ VIEW

[l-spine obl (1 of 2)]
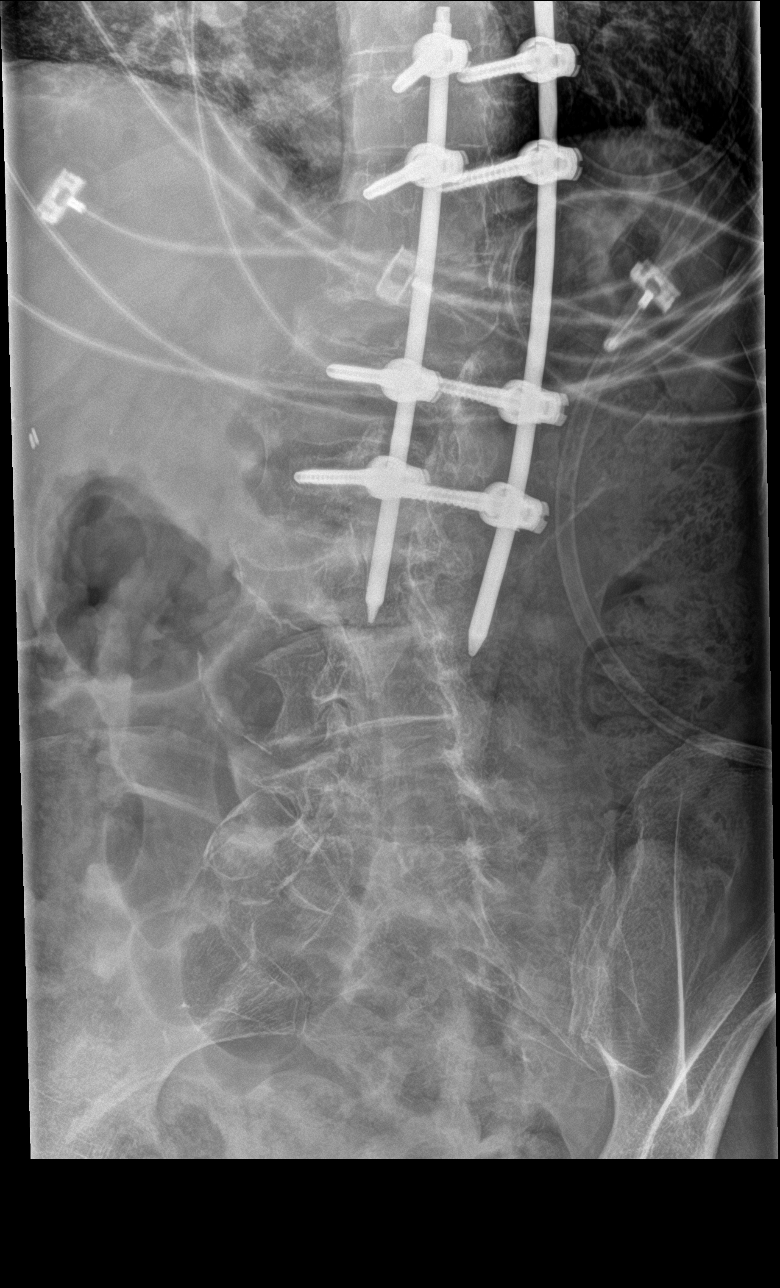

[l-spine obl (2 of 2)]
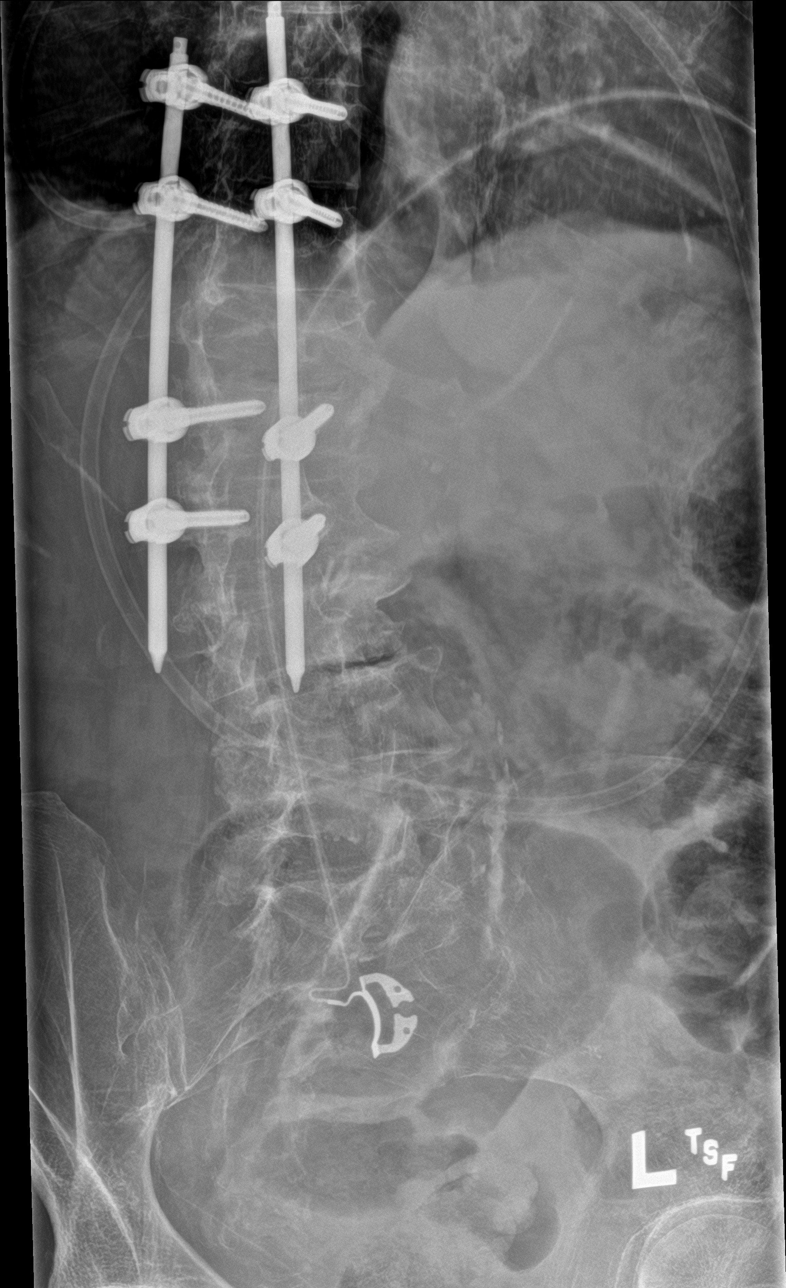

[l-spine lat]
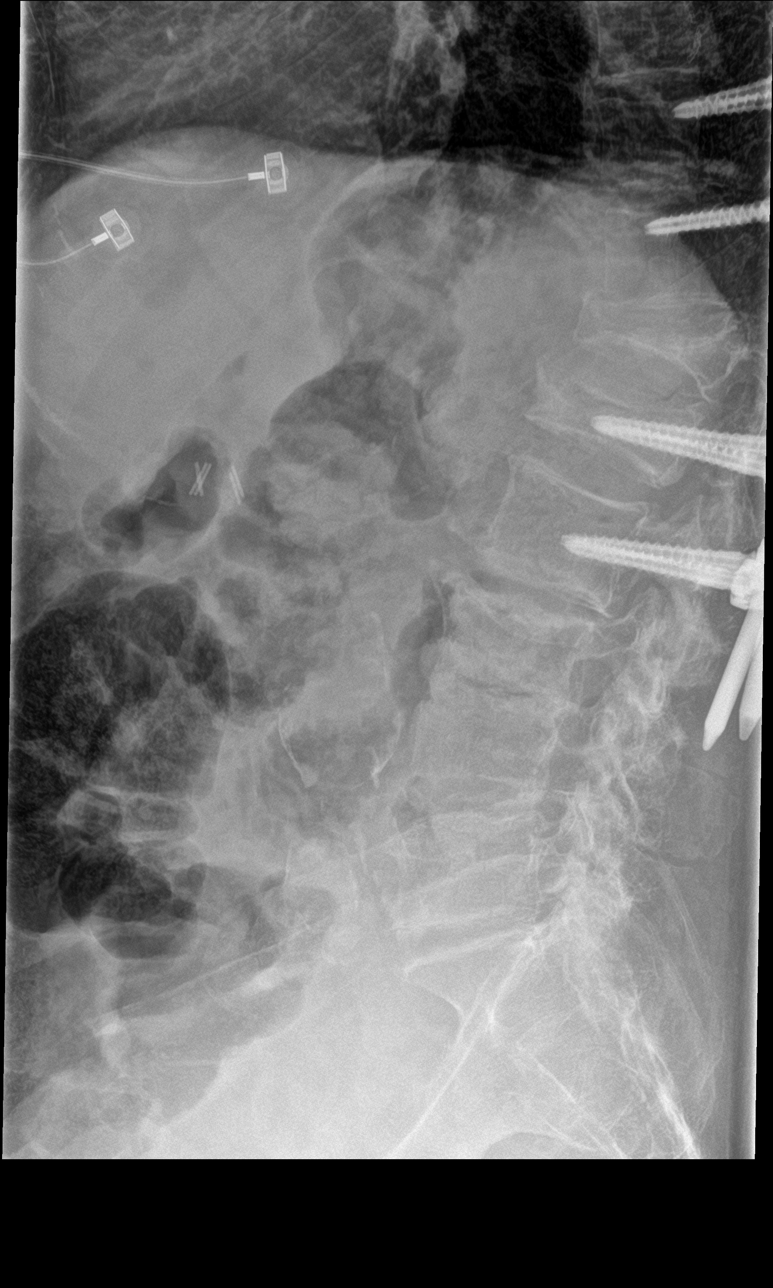

[l-spine spot]
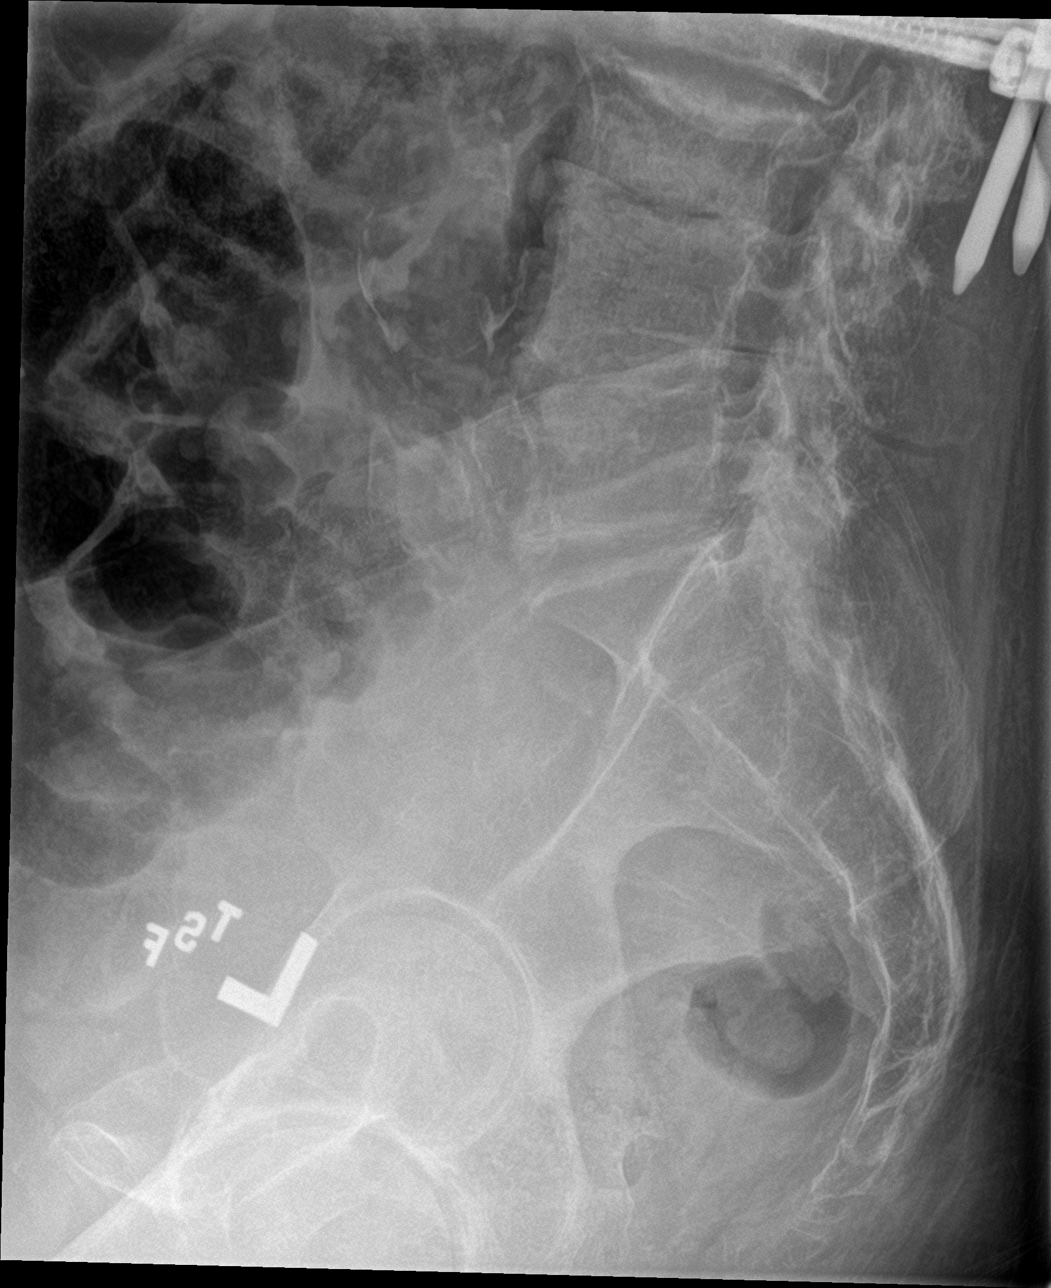

[l-spine ap]
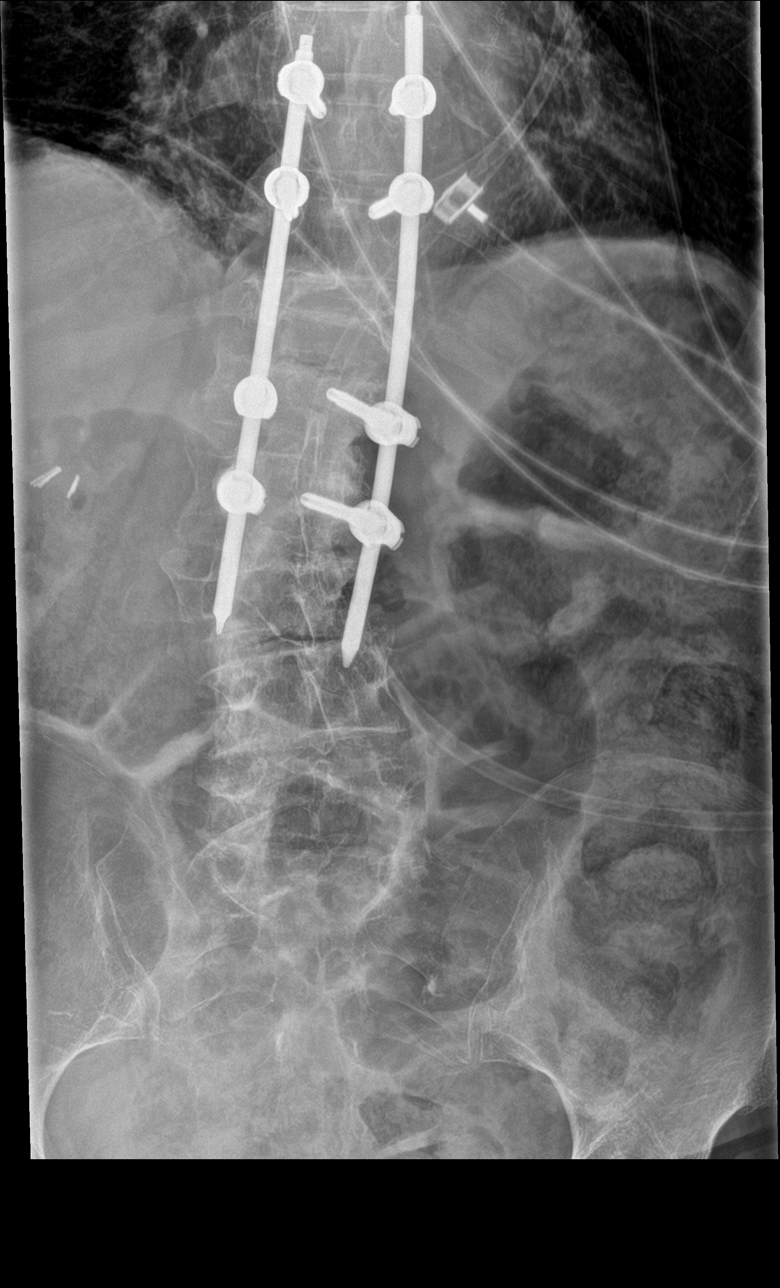

[5 of 5 positions shown; findings below may reference images not displayed]

FINDINGS: Compression deformities of vertebral bodies at T12 and L1 are
relatively stable from the prior CT. Mild degenerative change is
noted along the lower lumbar spine. Thoracolumbar spinal fusion
hardware is again noted at T10-L2.

There is aneurysmal dilatation of the distal abdominal aorta to
cm in AP dimension, defined by associated calcification. Clips are
noted within the right upper quadrant, reflecting prior
cholecystectomy.
IMPRESSION: 1. No evidence of acute fracture or subluxation along the lumbar
spine.
2. Chronic compression deformities of T12 and L1 are relatively
stable from the prior CT. Mild degenerative change along the lower
lumbar spine. Thoracolumbar spinal fusion hardware at T10-L2.
3. **An incidental finding of potential clinical significance has
been found. Aneurysmal dilatation of the distal abdominal aorta to
3.9 cm in AP dimension, with underlying aortic atherosclerosis. This
has increased in size from 3.4 cm in 8842. Recommend followup by
ultrasound in 2 years. This recommendation follows ACR consensus
guidelines: White Paper of the ACR Incidental Findings Committee II

## 2018-07-14 IMAGING — DX DG CHEST 2V
2 series · 2 of 2 positions shown · non-contrast
Comparison: Chest radiograph performed 02/29/2016, and CT of the
chest performed 06/04/2016

CLINICAL DATA: Status post fall backwards, with concern for chest
injury. Initial encounter.

EXAM:
CHEST  2 VIEW

[chest lat]
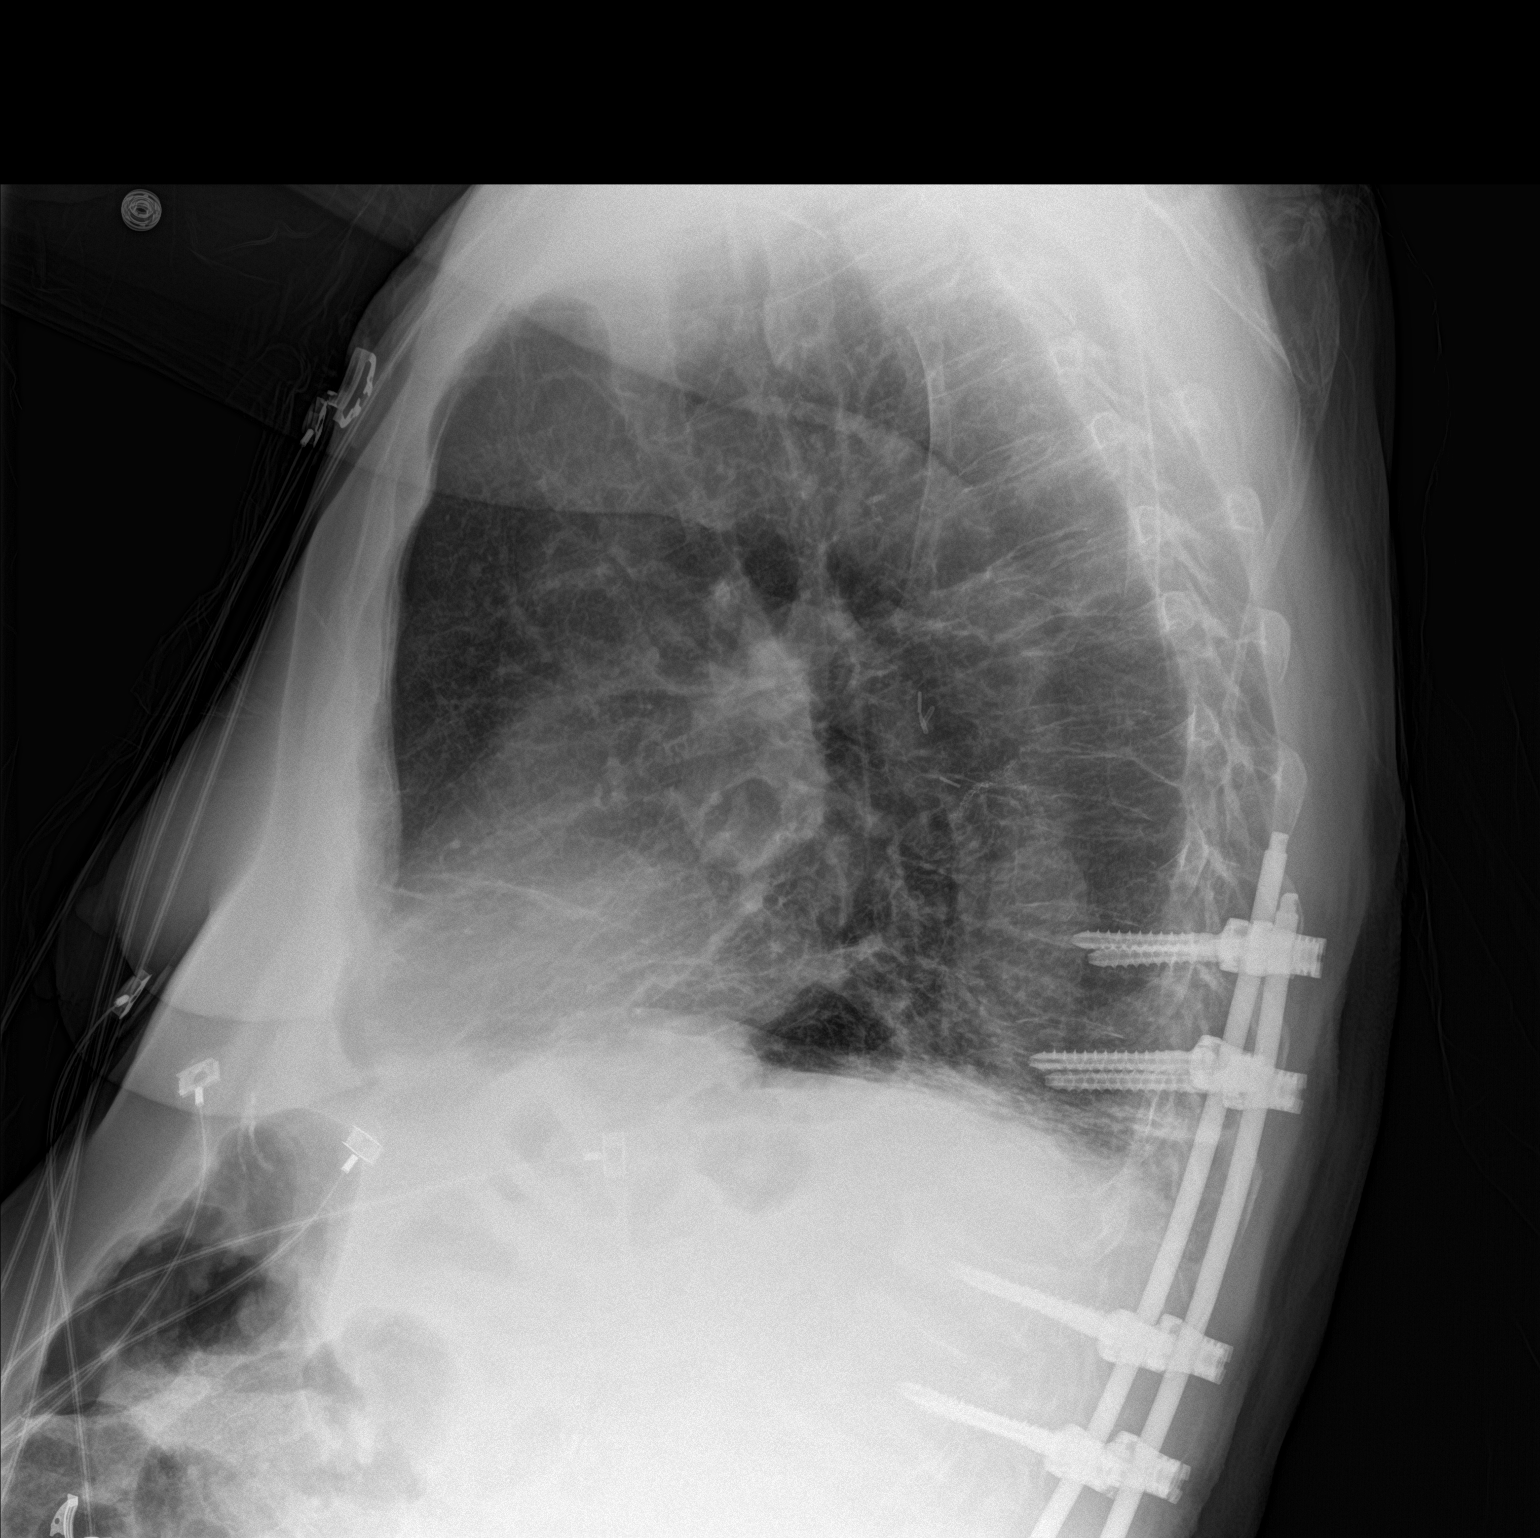

[chest ap]
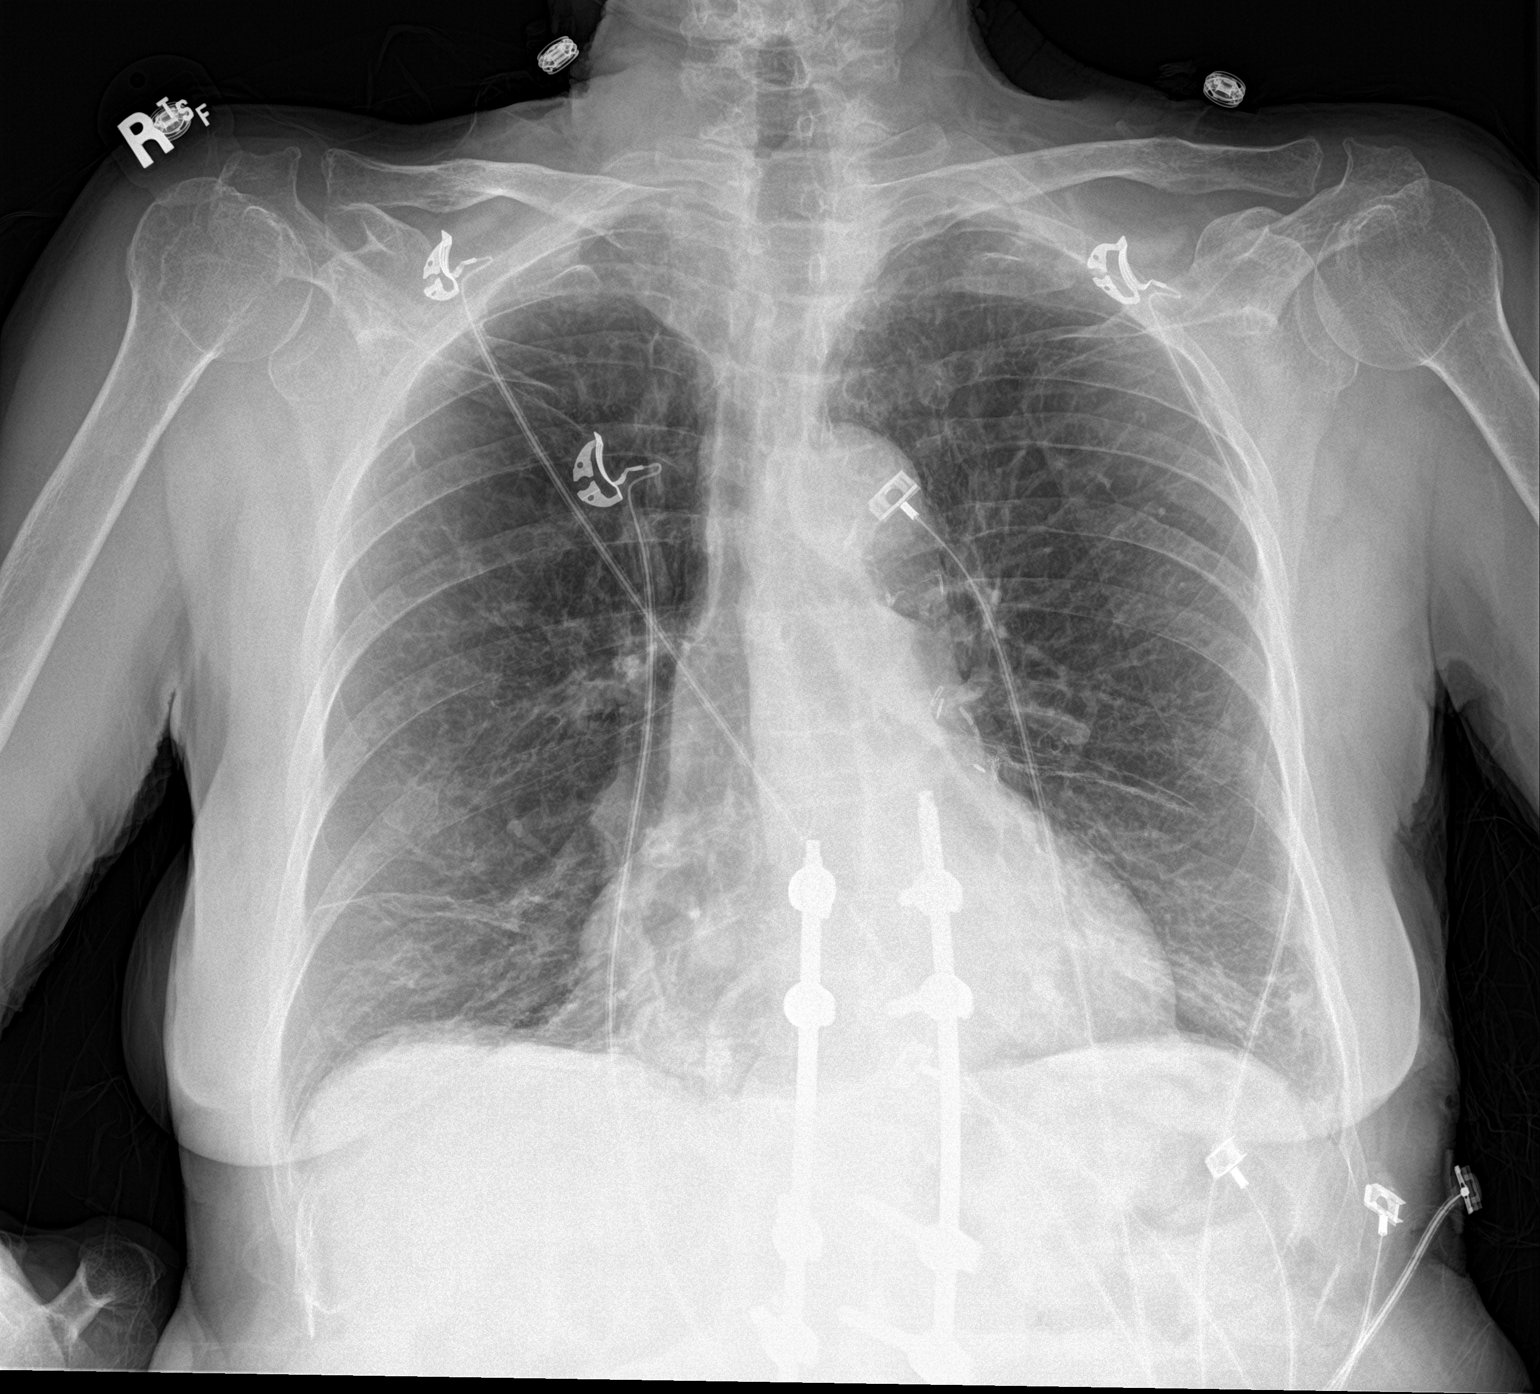

[2 of 2 positions shown; findings below may reference images not displayed]

FINDINGS: The lungs are well-aerated. Mild bibasilar atelectasis is noted.
Known pulmonary nodules are better characterized on prior CT. There
is no evidence of pleural effusion or pneumothorax.

The heart is mildly enlarged. No acute osseous abnormalities are
seen. Thoracolumbar spinal fusion hardware is partially imaged.
Multiple chronic compression deformities are noted along the
thoracic and upper lumbar spine.
IMPRESSION: 1. No displaced rib fracture seen. Multiple chronic compression
deformities along the thoracic and upper lumbar spine.
2. Mild bibasilar atelectasis noted. Known pulmonary nodules are
better characterized on prior CT.

## 2018-07-14 IMAGING — DX DG RIBS 2V*L*
2 series · 2 of 2 positions shown · non-contrast
Comparison: None.

CLINICAL DATA: Fall from bed this morning with left-sided chest
pain, initial encounter

EXAM:
LEFT RIBS - 2 VIEW

[rib pa]
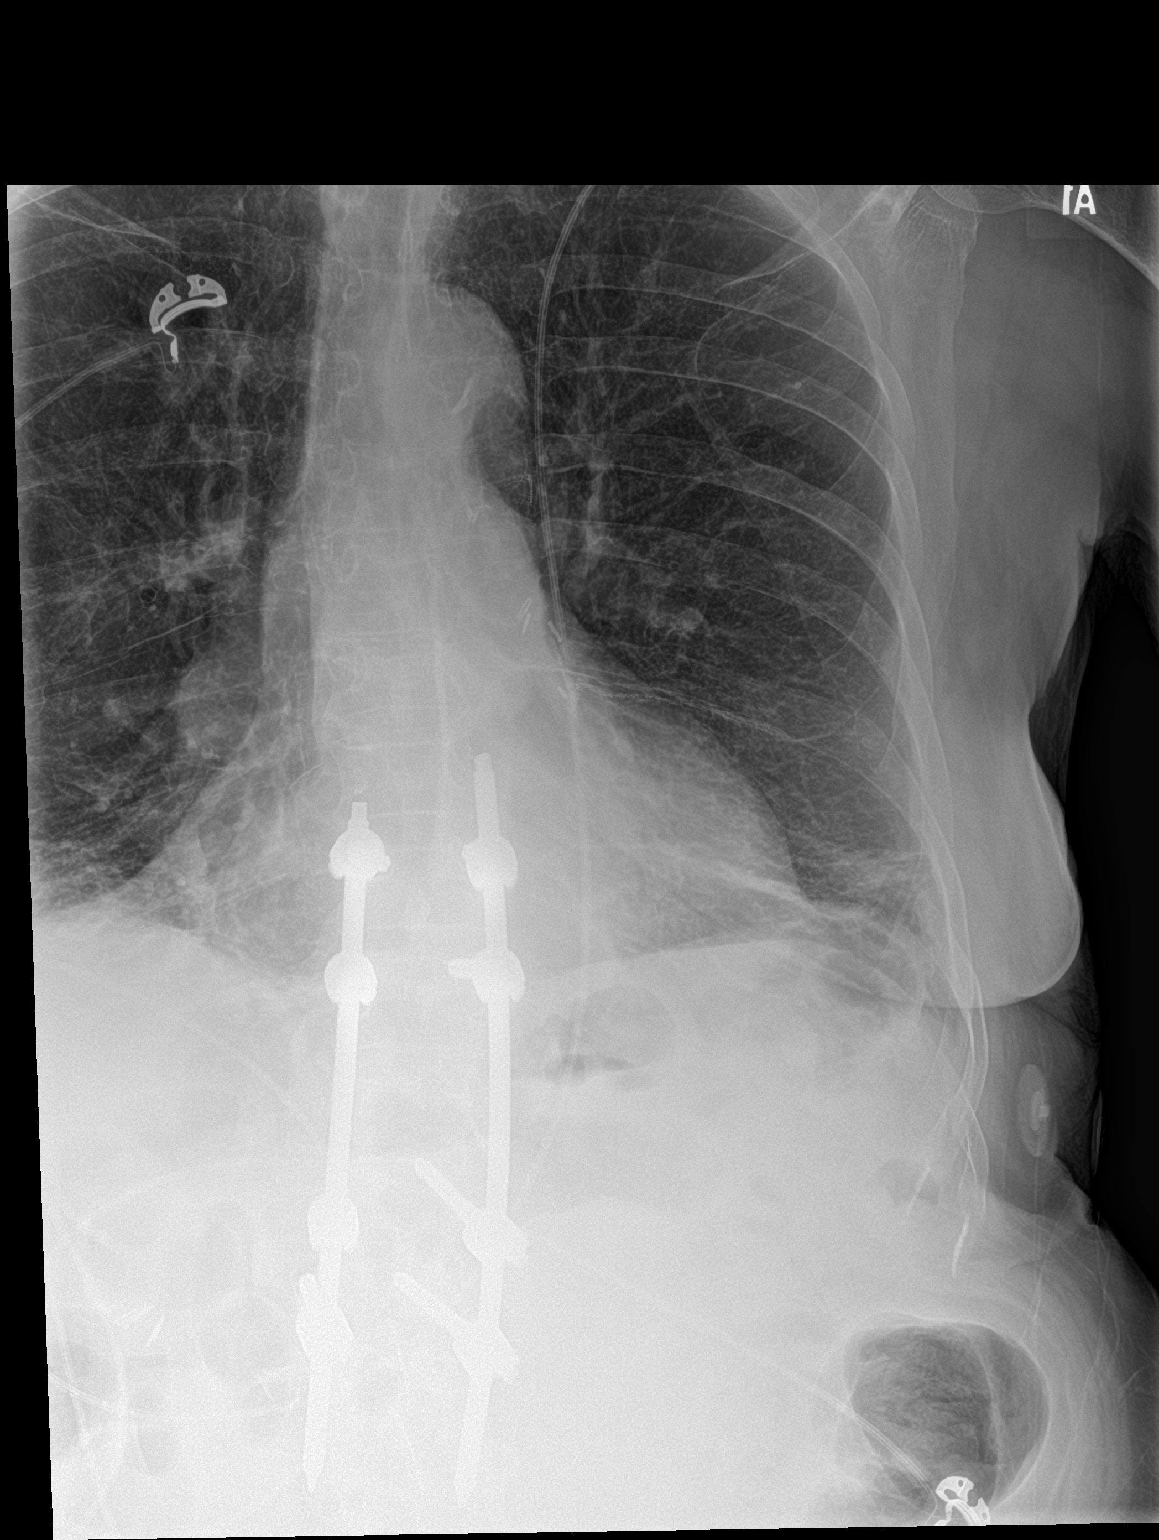

[rib pa obl]
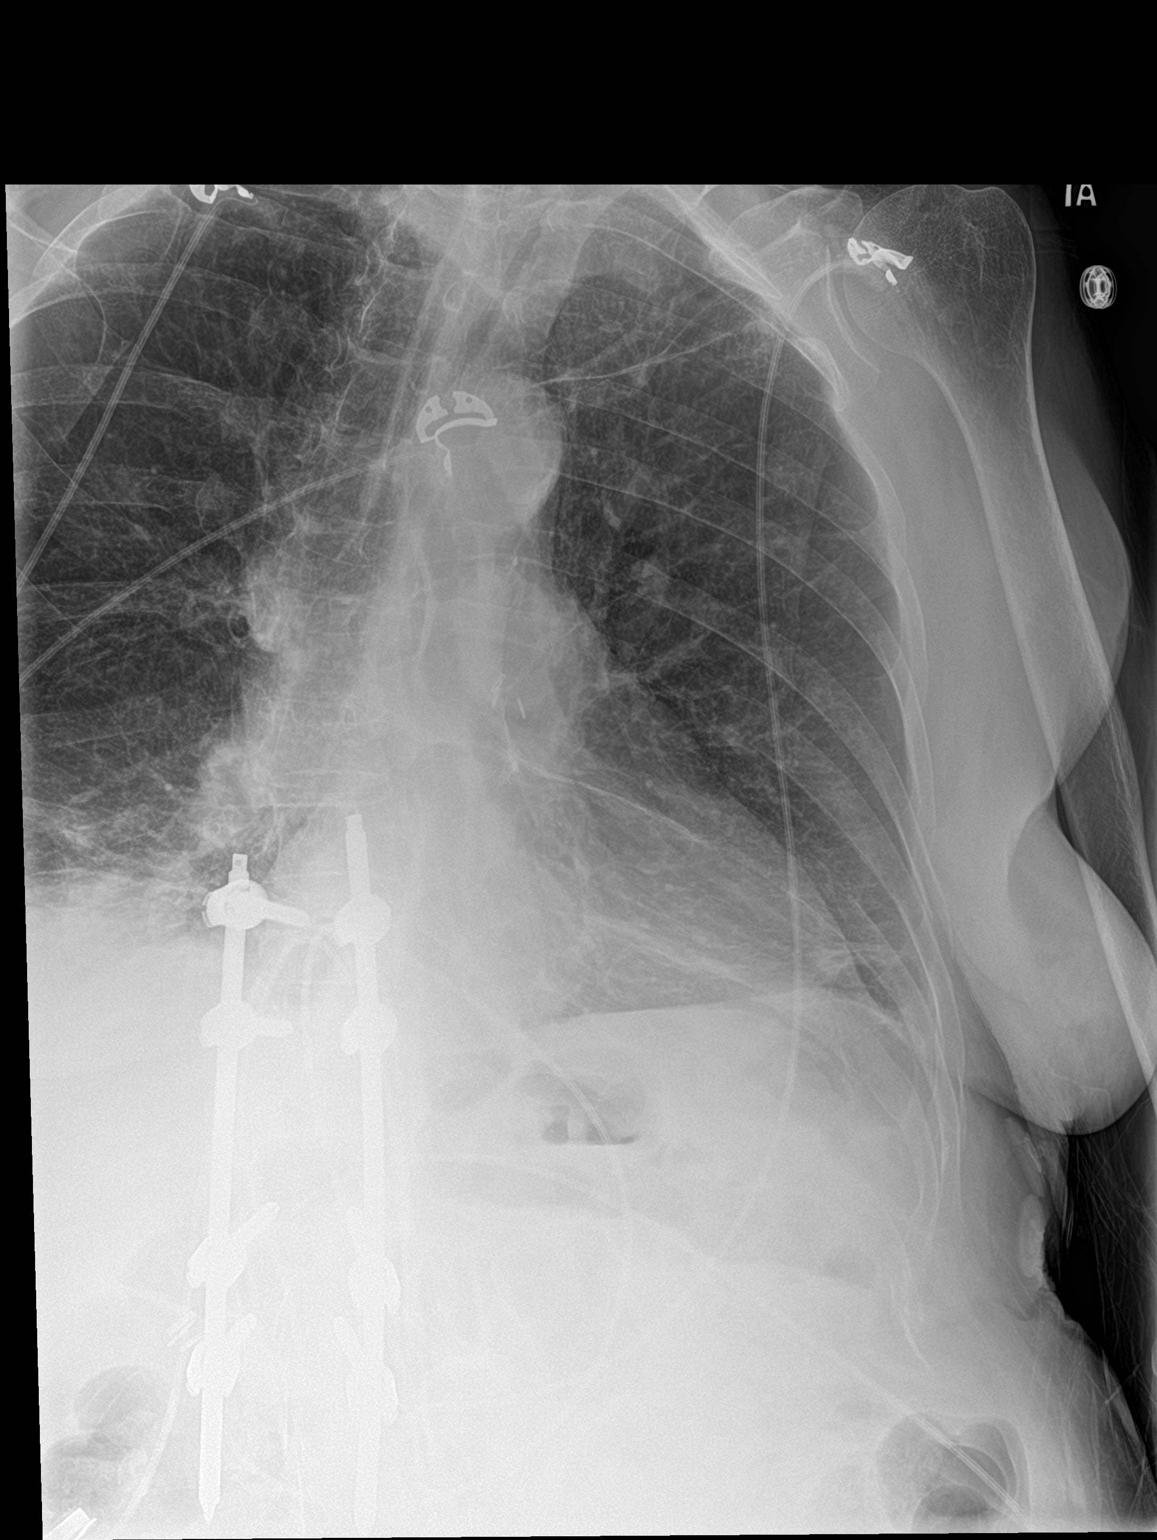

[2 of 2 positions shown; findings below may reference images not displayed]

FINDINGS: No fracture or other bone lesions are seen involving the ribs.
Bibasilar atelectatic changes are again seen.
IMPRESSION: No acute rib abnormality noted.

## 2018-07-14 IMAGING — DX DG THORACIC SPINE 2V
3 series · 3 of 3 positions shown · non-contrast
Comparison: Chest radiograph performed 02/29/2016, and CT of the
chest performed 06/04/2016

CLINICAL DATA: Acute onset of upper back pain, after fall
backwards. Initial encounter.

EXAM:
THORACIC SPINE 2 VIEWS

[t-spine ap]
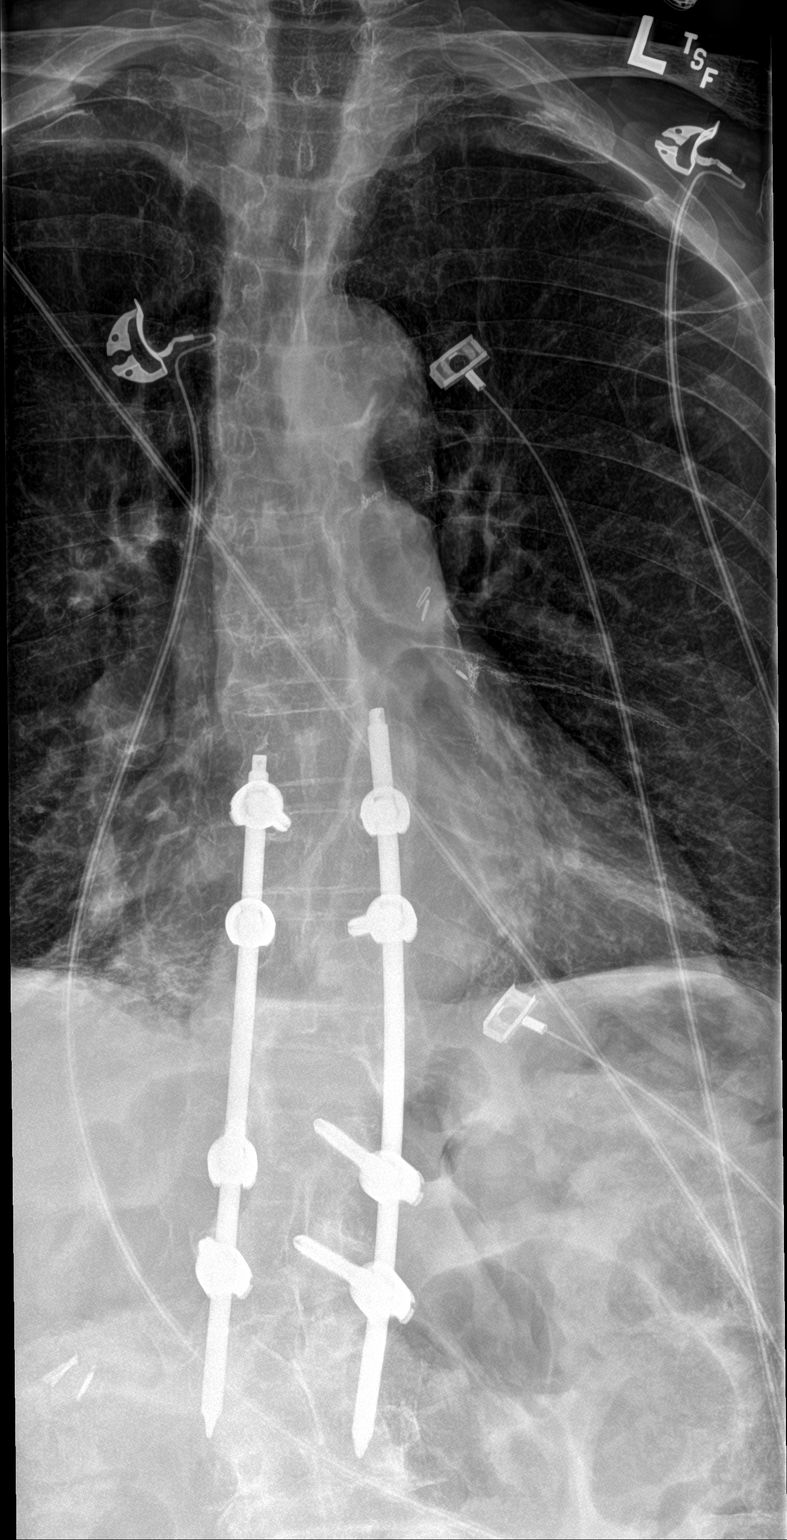

[t-spine lat]
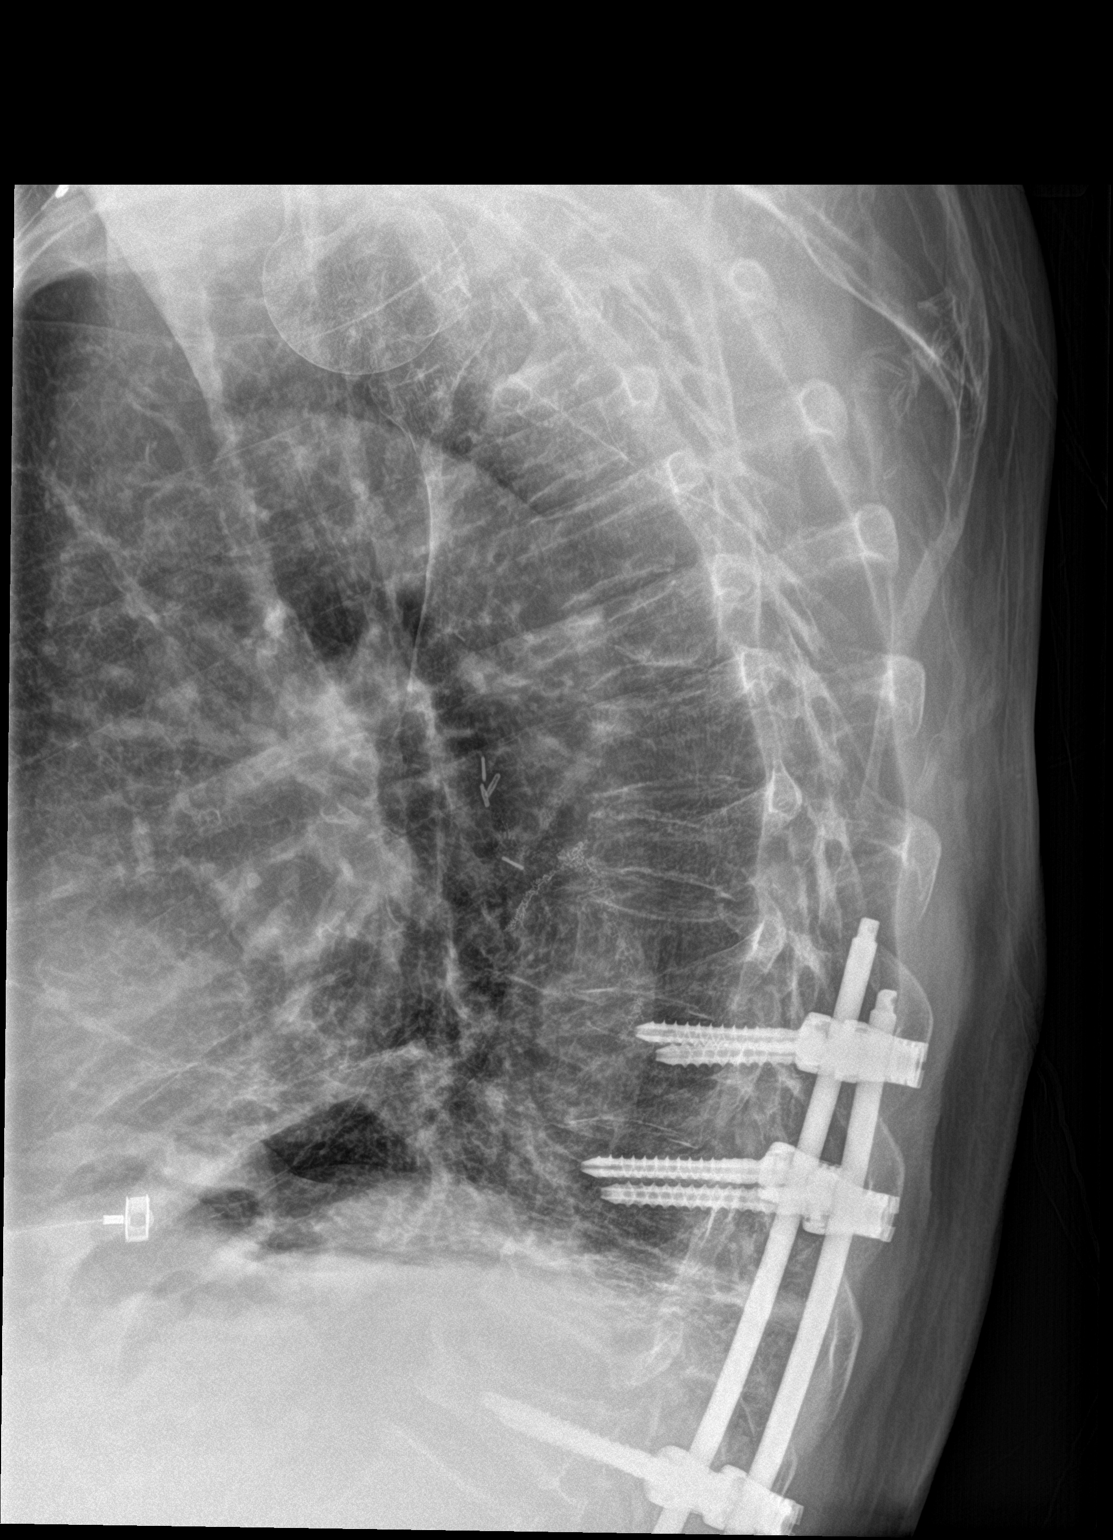

[t-spine swimmers]
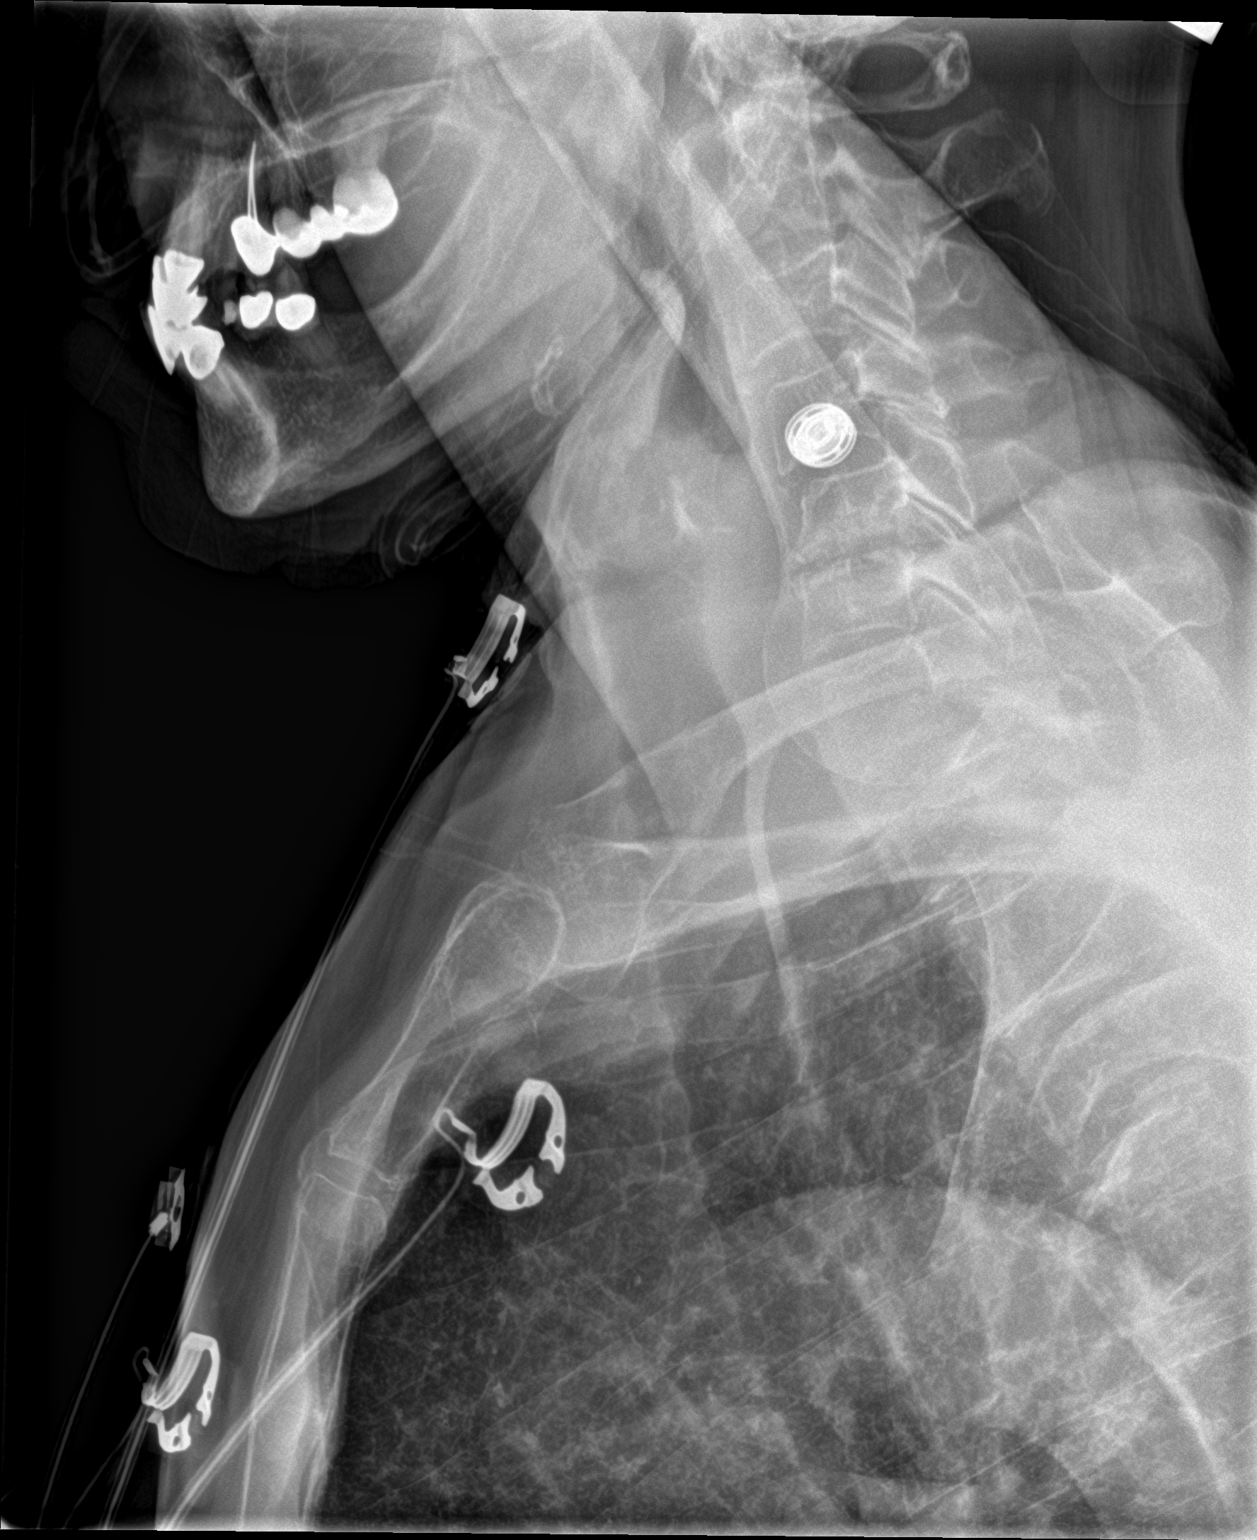

[3 of 3 positions shown; findings below may reference images not displayed]

FINDINGS: There is question of mild new compression deformity of vertebral
body T6, which could be acute or subacute in nature.

Mild compression deformity of T8 is stable in appearance.
Compression deformities of T12 and L1 are also unchanged. Underlying
thoracolumbar spinal fusion hardware is again noted extending
inferiorly from T10. Intervertebral disc spaces are grossly
preserved.

The visualized portions of both lungs are clear. The mediastinum is
unremarkable in appearance.
IMPRESSION: Question of mild new compression deformity of vertebral body T6,
which could be acute or subacute in nature. Chronic compression
deformities of T8, T12 and L1 are unchanged, with underlying
thoracolumbar spinal fusion hardware.

## 2018-07-14 IMAGING — DX DG PELVIS 1-2V
1 series · 1 of 1 positions shown · non-contrast
Comparison: CT of the abdomen and pelvis from 11/19/2014

CLINICAL DATA: Status post fall, with pelvic pain. Initial
encounter.

EXAM:
PELVIS - 1-2 VIEW

[pelvis ap]
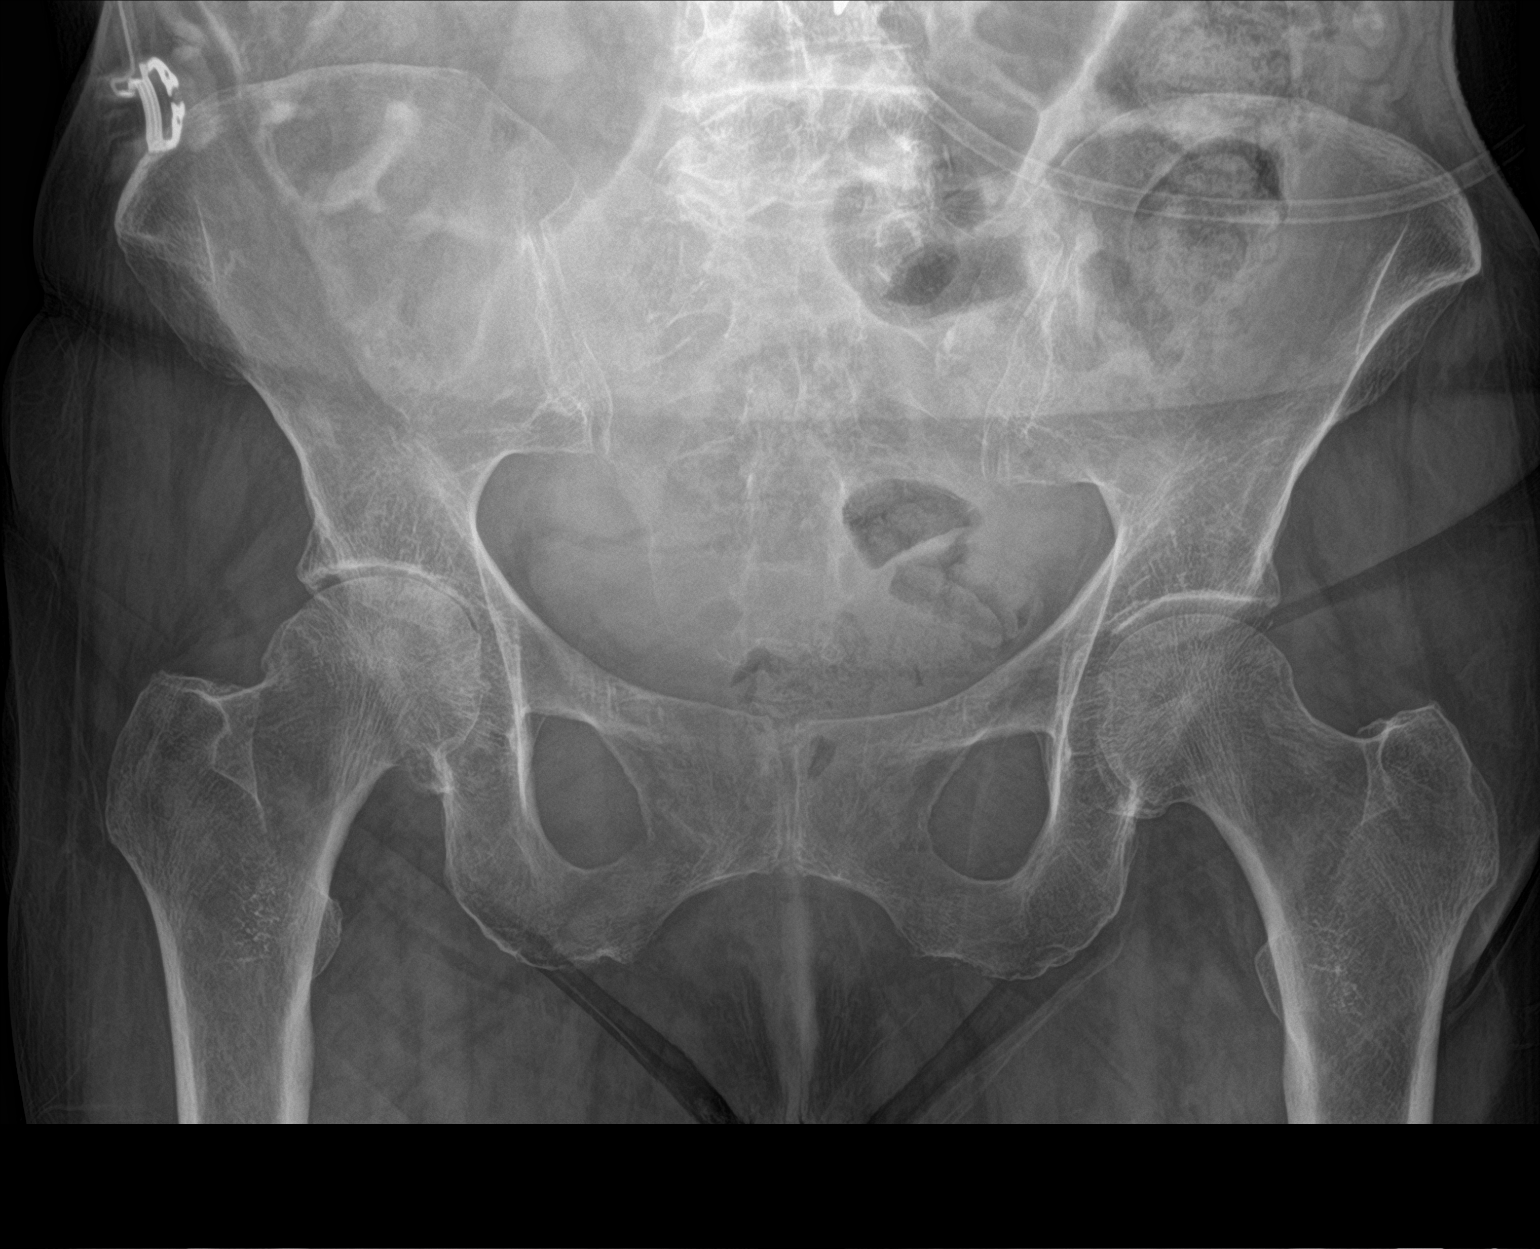

[1 of 1 positions shown; findings below may reference images not displayed]

FINDINGS: There is no evidence of fracture or dislocation.

Axial joint space narrowing is noted at the right hip, with mild
underlying sclerosis. The left hip joint is unremarkable in
appearance. Mild degenerative change is noted at the lower lumbar
spine. The sacroiliac joints are grossly unremarkable.

The visualized bowel gas pattern is unremarkable in appearance.
IMPRESSION: 1. No evidence of fracture or dislocation.
2. Axial joint space narrowing at the right hip, with mild
associated sclerosis.
3. Mild degenerative change at the lower lumbar spine.

## 2018-07-15 IMAGING — CT CT HEAD W/O CM
3 series · 16 of 47 positions shown, 19 images · non-contrast
Comparison: 04/09/2014

CLINICAL DATA: Headache and nausea.

EXAM:
CT HEAD WITHOUT CONTRAST
TECHNIQUE: Contiguous axial images were obtained from the base of the skull
through the vertex without intravenous contrast.

[Series 2: head trauma wo · axial · 0.44mm/px · z∈[+23,+153]mm · 10 of 32 slices shown, 13 images]
[im 3/32  brain]
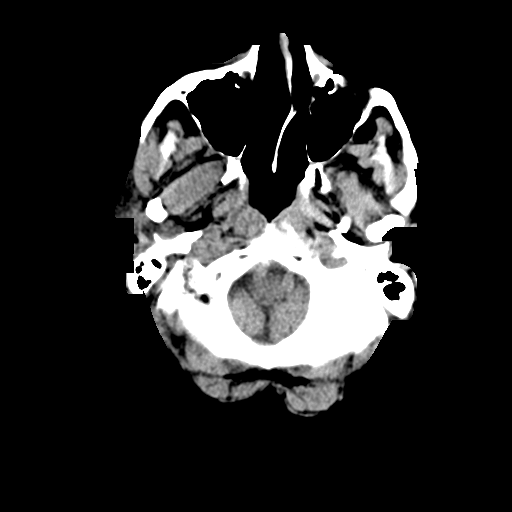
[im 3/32  bone]
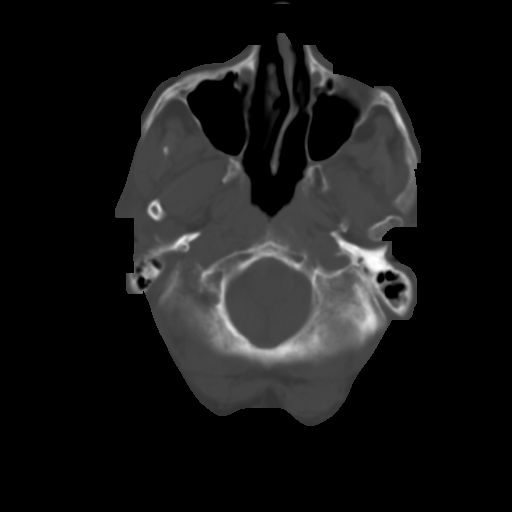
[im 6/32  brain]
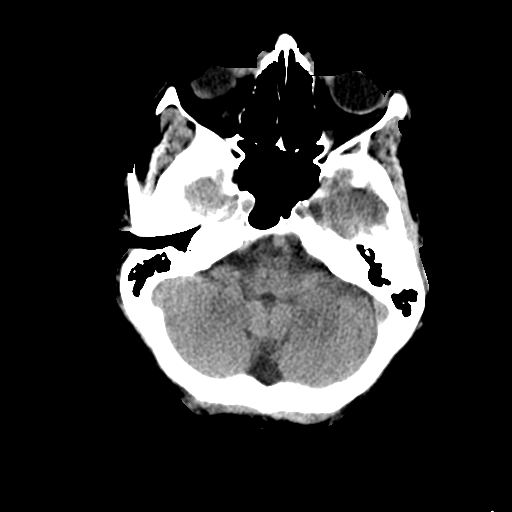
[im 9/32  brain]
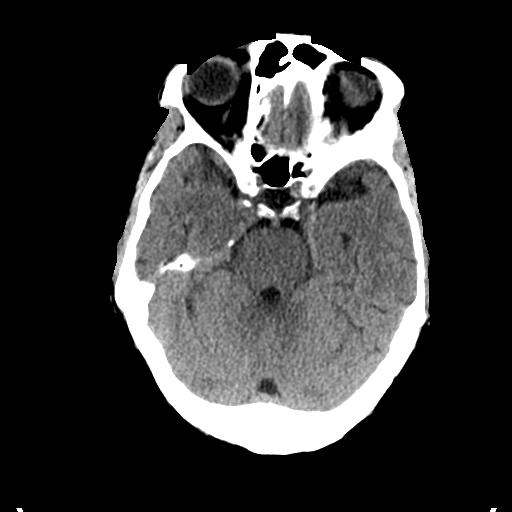
[im 11/32  brain]
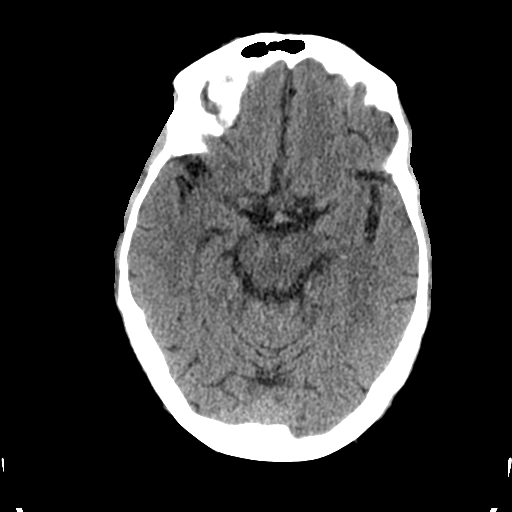
[im 14/32  brain]
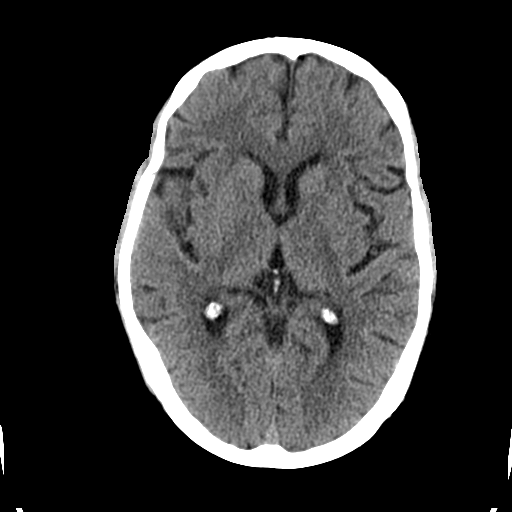
[im 14/32  bone]
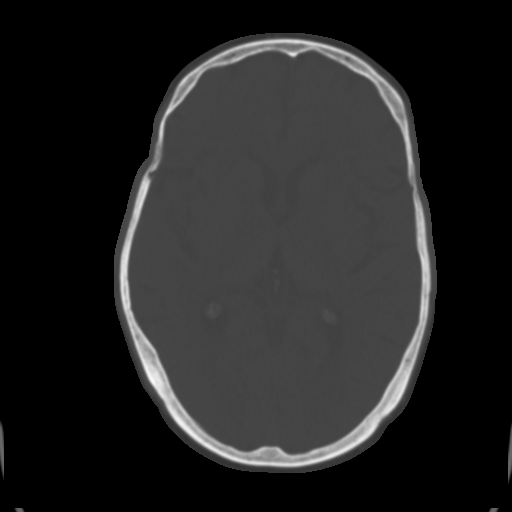
[im 18/32  brain]
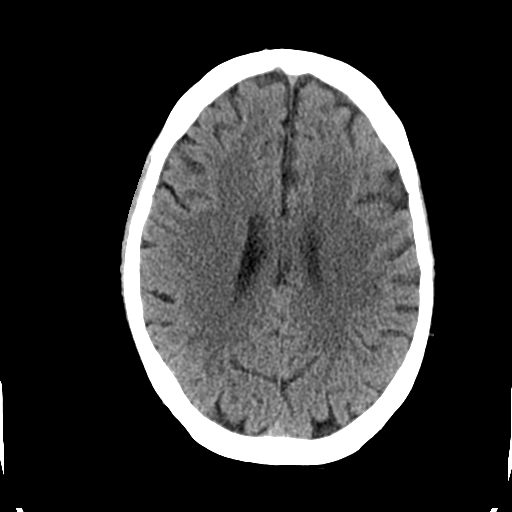
[im 21/32  brain]
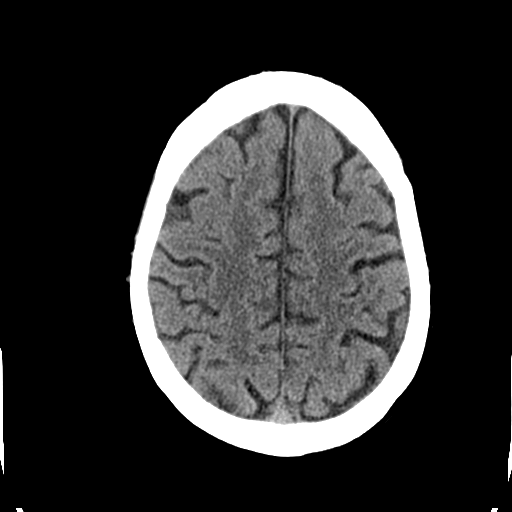
[im 24/32  brain]
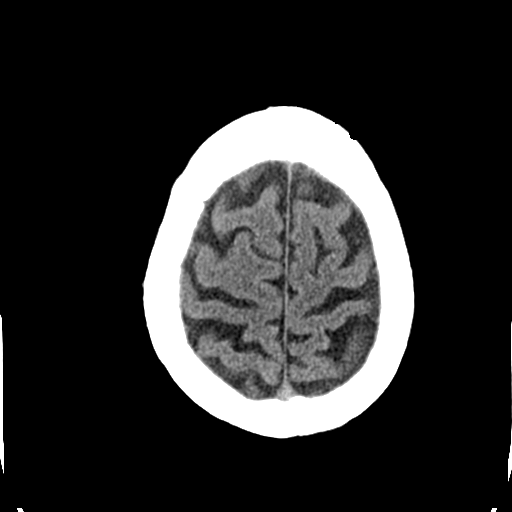
[im 26/32  brain]
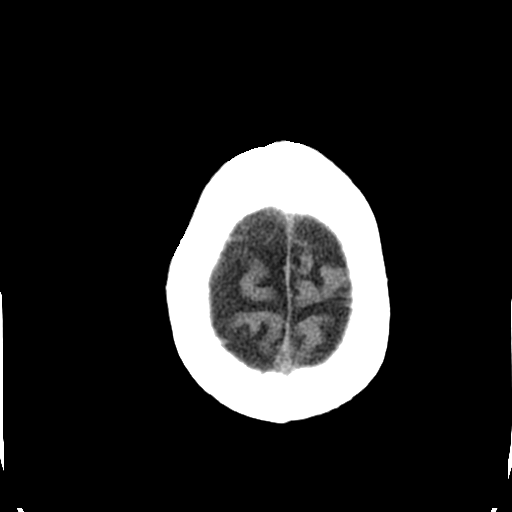
[im 26/32  bone]
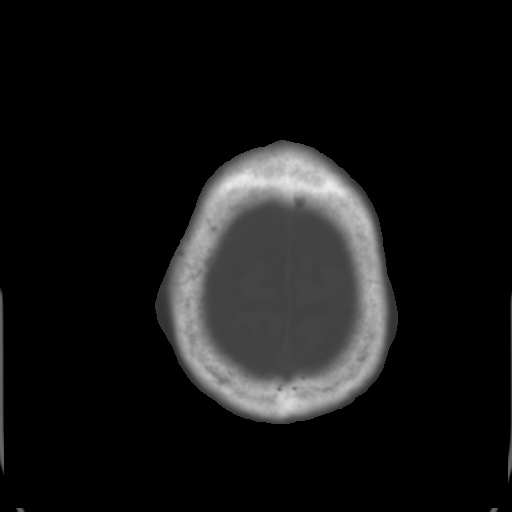
[im 29/32  brain]
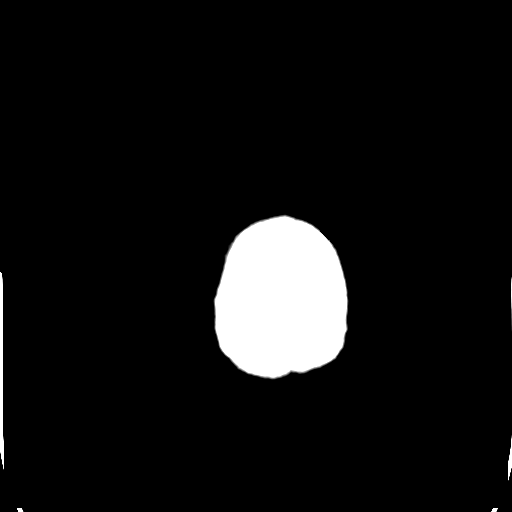

[Series 4: coronal soft tissue · coronal · 0.32mm/px · 3 of 70 slices shown]
[im 24/70  brain]
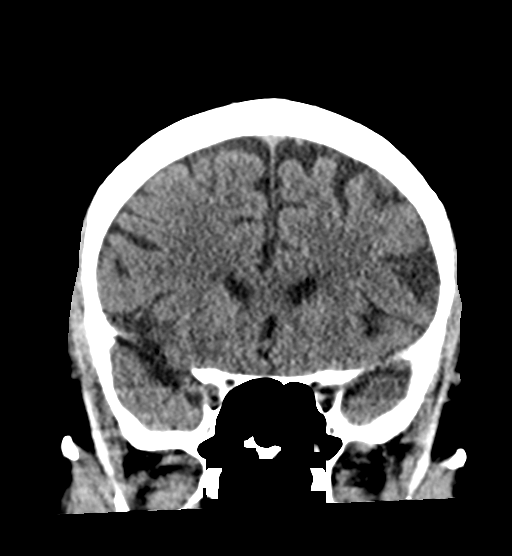
[im 31/70  brain]
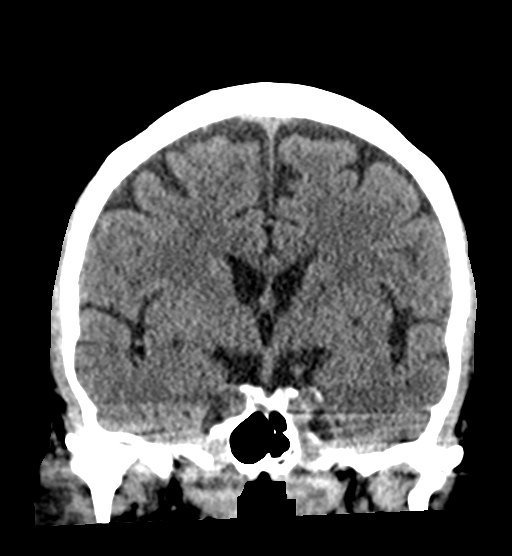
[im 39/70  brain]
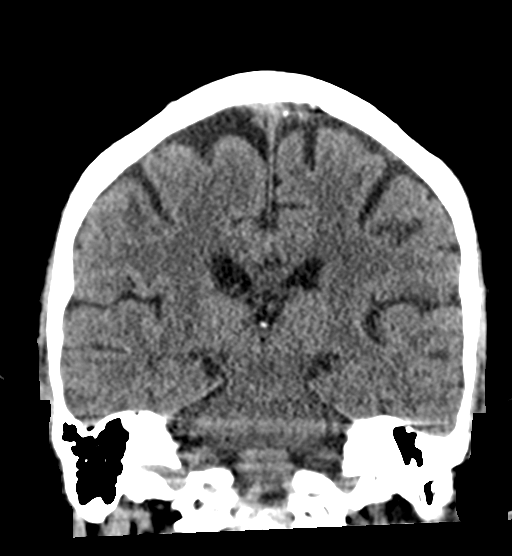

[Series 5: sagittal soft tissue · sagittal · 0.33mm/px · 3 of 55 slices shown]
[im 19/55  brain]
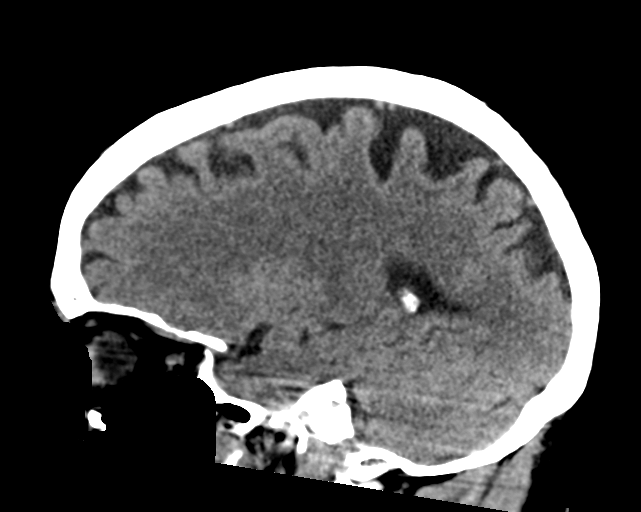
[im 28/55  brain]
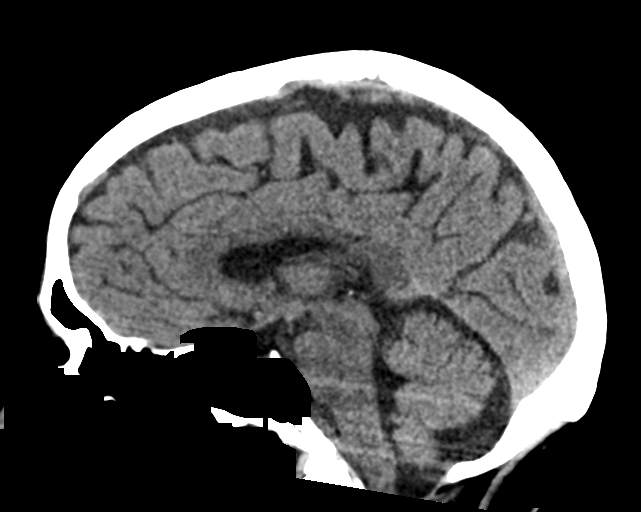
[im 37/55  brain]
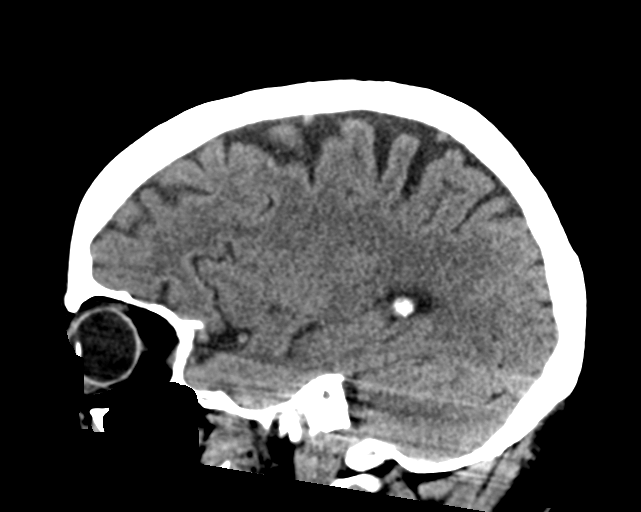

[16 of 47 positions shown; findings below may reference images not displayed]

FINDINGS: Brain: No evidence of acute infarction, hemorrhage, hydrocephalus,
extra-axial collection or mass lesion/mass effect.

Vascular: No hyperdense vessel or unexpected calcification.

Skull: Normal. Negative for fracture or focal lesion.

Sinuses/Orbits: No acute finding.

Other: None.
IMPRESSION: 1. No acute intracranial abnormalities.  Normal brain.

## 2018-07-25 IMAGING — DX DG CHEST 2V
2 series · 2 of 2 positions shown · non-contrast
Comparison: Chest x-ray of [DATE]

CLINICAL DATA: Productive cough. Bibasilar crackles on exam.
History of pneumonia, COPD, on supplemental oxygen.

EXAM:
CHEST  2 VIEW

[chest pa]
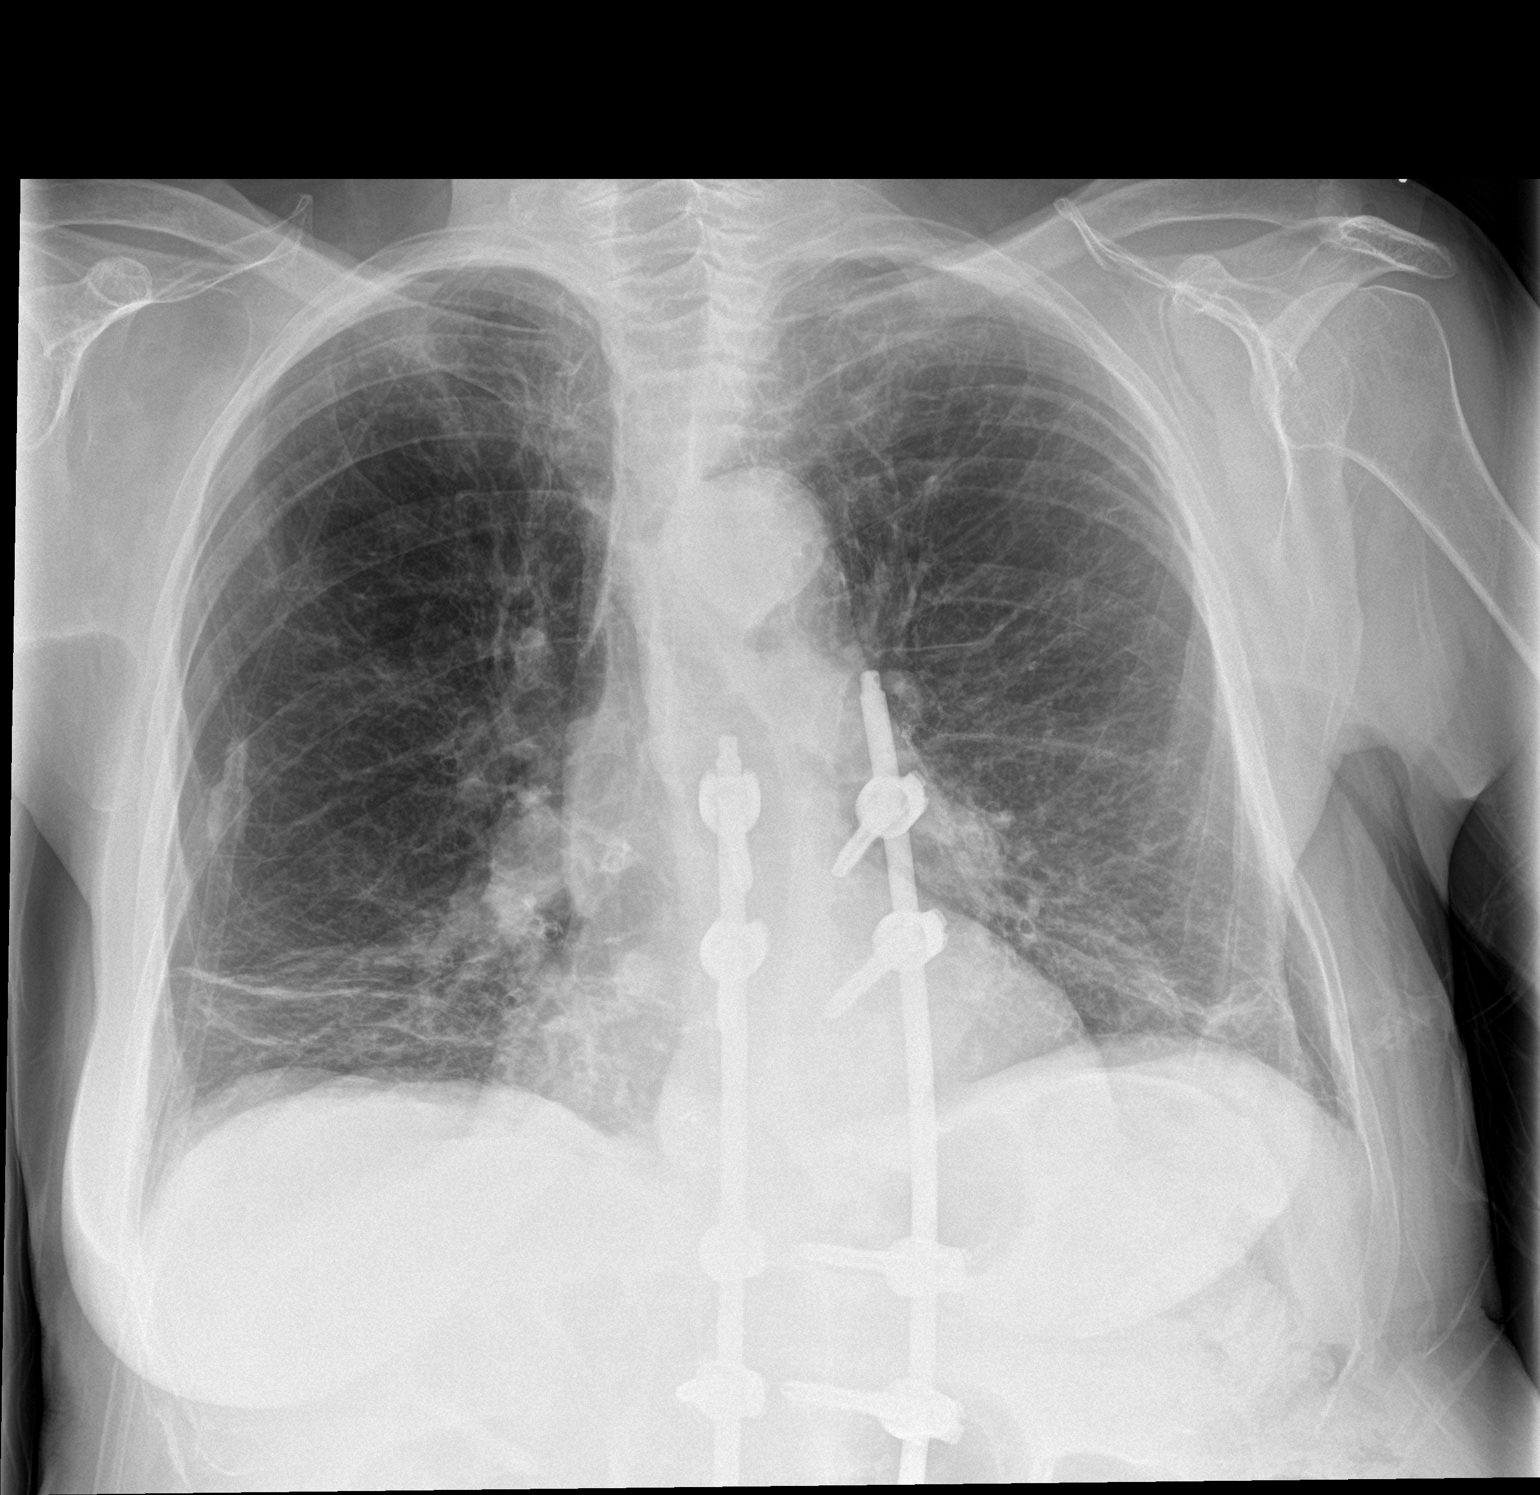

[chest lat]
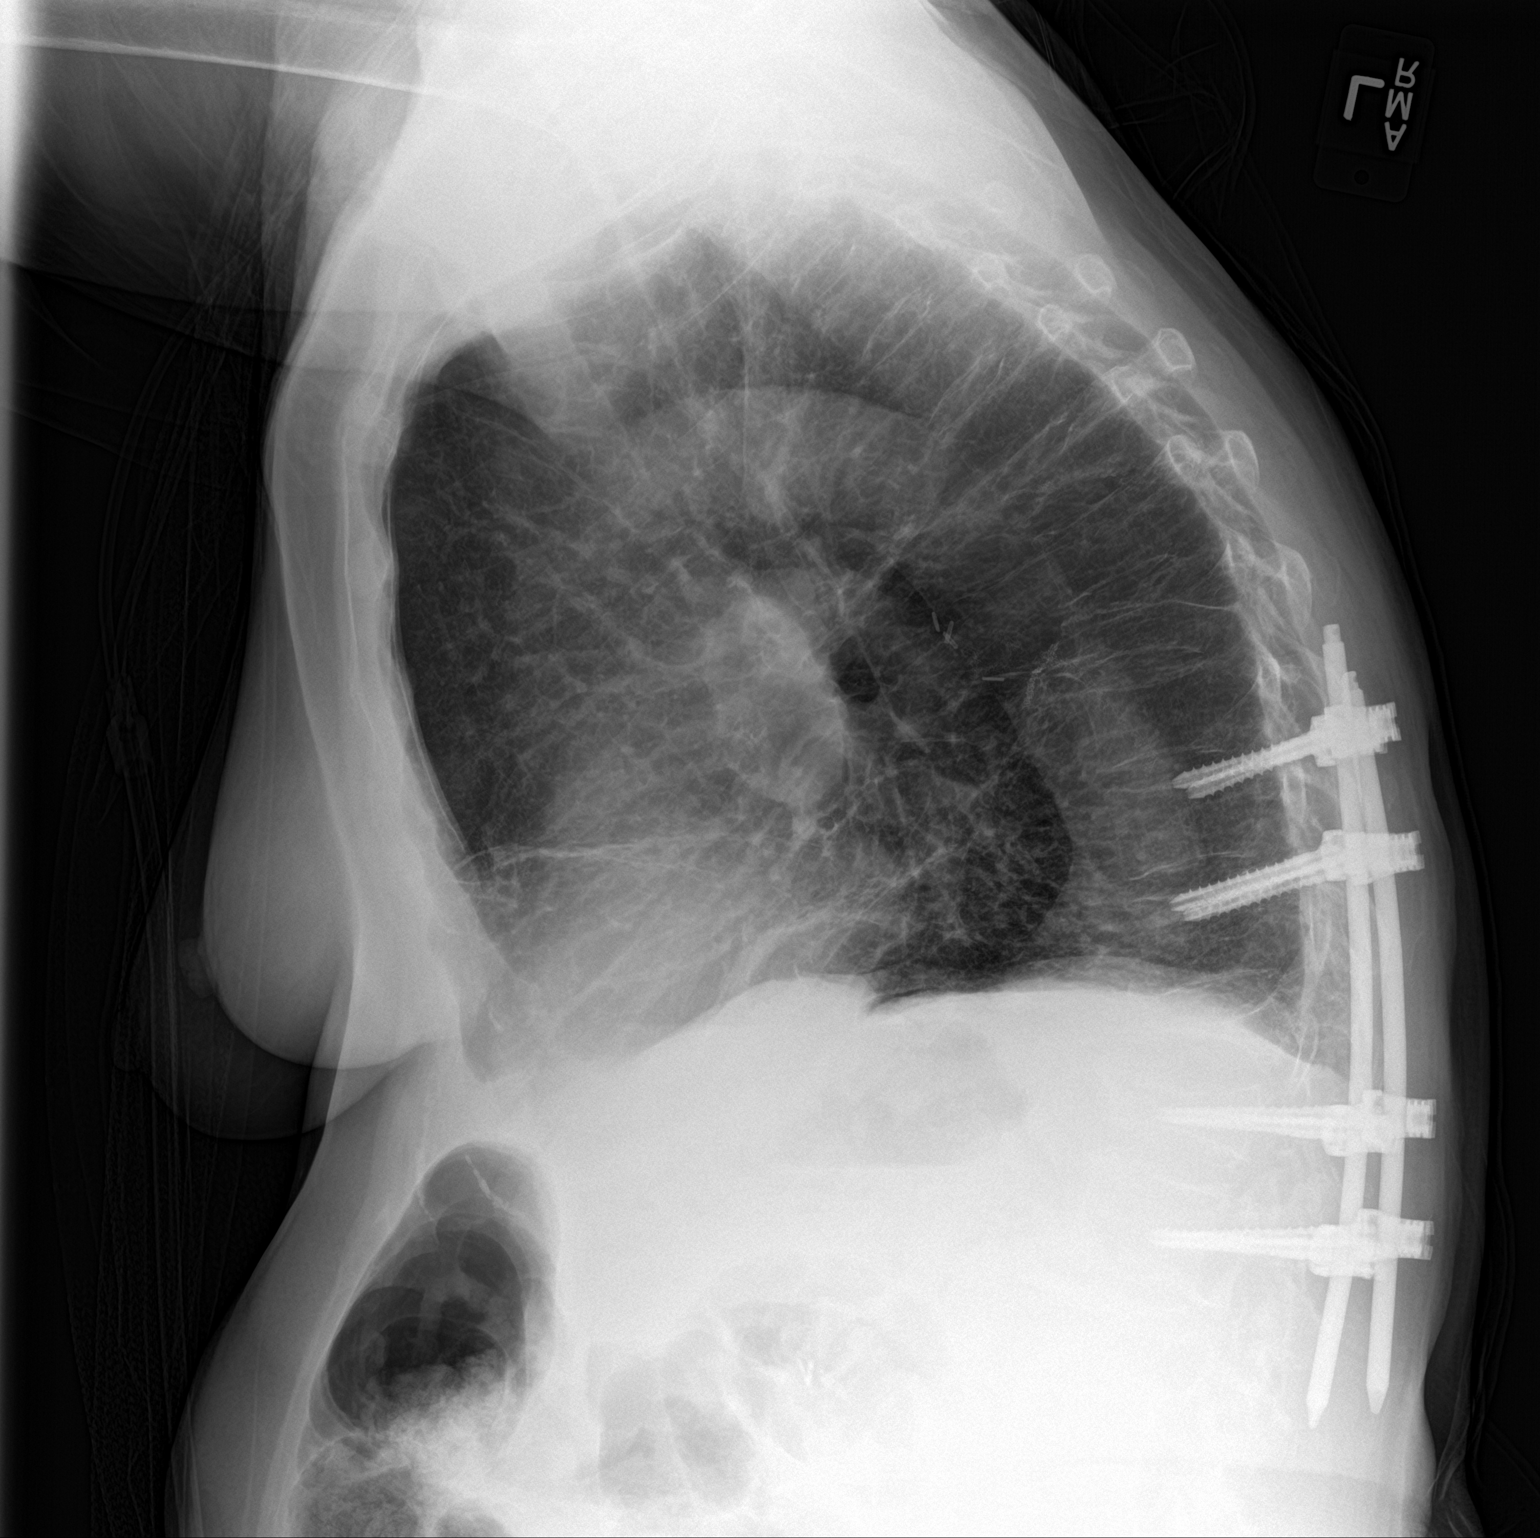

[2 of 2 positions shown; findings below may reference images not displayed]

FINDINGS: The lungs are adequately inflated. There is new increased linear
density at the right lung base. This is in addition to pre-existing
density here. On the left there is stable basilar density. There is
no pleural effusion or pneumothorax. The heart is top-normal in
size. The pulmonary vascularity is not engorged. There is
calcification in the wall of the aortic arch. The patient has
undergone posterior fusion from the mid thoracic spine inferiorly.
The bony structures exhibit no acute abnormalities.
IMPRESSION: COPD. Increased linear density at the right lung base suggests
worsening atelectasis. There is no alveolar pneumonia. No CHF.

Thoracic aortic atherosclerosis.

## 2018-08-04 ENCOUNTER — Encounter: Payer: Self-pay | Admitting: Family Medicine

## 2018-08-05 ENCOUNTER — Encounter: Payer: Self-pay | Admitting: Family Medicine

## 2018-08-05 ENCOUNTER — Ambulatory Visit (INDEPENDENT_AMBULATORY_CARE_PROVIDER_SITE_OTHER): Payer: PPO | Admitting: Family Medicine

## 2018-08-05 VITALS — BP 110/78 | HR 78 | Temp 97.6°F | Resp 14 | Ht 66.0 in | Wt 116.0 lb

## 2018-08-05 DIAGNOSIS — G500 Trigeminal neuralgia: Secondary | ICD-10-CM | POA: Diagnosis not present

## 2018-08-05 MED ORDER — OXYCODONE-ACETAMINOPHEN 5-325 MG PO TABS: 1.0000 | ORAL_TABLET | ORAL | 0 refills | Status: DC | PRN

## 2018-08-05 MED ORDER — CARBAMAZEPINE 200 MG PO TABS: 100.0000 mg | ORAL_TABLET | Freq: Two times a day (BID) | ORAL | 1 refills | Status: DC

## 2018-08-05 NOTE — Progress Notes (Signed)
Subjective:    Patient ID: [name], female    DOB: 08-Jun-1941, 77 y.o.   MRN: [id number]  HPI  Patient has a history of trigeminal neuralgia on the left side of her face.  She reports lancing shooting pain radiating into her mouth, onto her tongue, down her jaw, and behind her eyes on the left side of her face.  Pain is intense and severe.  It is similar to episodes of pain she is been experiencing off and on for 15 years.  She is requesting something today for pain.  Examination reveals no lesions inside the mouth.  There are no vesicles in her around the mouth or on the left side of the face.  There is no erythema in the eye.  There is no evidence of an ear infection.  There is no sinus tenderness to palpation.  There is no abscess tooth or infection in the mouth that would explain her pain Past Medical History:  Diagnosis Date  . Adenocarcinoma of lung ([place]) 12/2010   left lung/surg only  . Allergic rhinitis   . 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 Anxiety disorder   . Aortic aneurysm ([place])   . Complication of anesthesia 2011   bloodpressure dropped during colonoscopy, not problems since  . Compression fracture   . COPD (chronic obstructive pulmonary disease) ([place])   . Depression   . Diastolic dysfunction   . Diverticulosis   . Dysphagia   . Emphysema lung ([place]) 11/08/2014  . Hiatal hernia   . Hypothyroidism   . IBS (irritable bowel syndrome)   . Iron deficiency anemia due to chronic blood loss 12/05/2016  . On home O2 12/15/2013   chronic hypoxia  . PUD (peptic ulcer disease)    22yrs  . Shortness of breath    with exertion  . SIADH (syndrome of inappropriate ADH production) ([place])   . Trigeminal neuralgia    Past Surgical History:  Procedure Laterality Date  . ABDOMINAL HYSTERECTOMY    . BACK SURGERY  January 13, 2014  . CATARACT EXTRACTION    . CATARACT EXTRACTION W/PHACO  09/02/2012   Procedure: CATARACT EXTRACTION PHACO AND  INTRAOCULAR LENS PLACEMENT (IOC);  Surgeon: Elta Guadeloupe T. Gershon Crane, MD;  [facility]: [facility];  Service: Ophthalmology;  Laterality: Left;  CDE:10.35  . CHOLECYSTECTOMY    . 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 COLONOSCOPY N/A 11/23/2014   Dr. Oneida Alar: moderate diverticulosis, hemorrhoids, redundant colon. next TCS in 10-15 years.   Marland Kitchen ENDARTERECTOMY Right 08/21/2013   Procedure: RIGHT CAROTID ANEURYSM RESECTION;  Surgeon: Rosetta Posner, MD;  Location: [place];  Service: Vascular;  Laterality: Right;  . ESOPHAGEAL DILATION  02/24/2018   Procedure: ESOPHAGEAL DILATION;  Surgeon: Danie Binder, MD;  [facility];  Service: Endoscopy;;  . ESOPHAGOGASTRODUODENOSCOPY  11/13/2009   w/dilation to 72mm, gastric ulceration (H.Pylori) s/p treatment  . ESOPHAGOGASTRODUODENOSCOPY  11/21/2009   distal esophageal web, gastritis  . ESOPHAGOGASTRODUODENOSCOPY  03/14/12   RCV:[id number] in the distal esophagus/Mild gastritis/small HH. + H.pylori, prescribed Pylera. Finished treatment.   . ESOPHAGOGASTRODUODENOSCOPY N/A 12/13/2017   Procedure: ESOPHAGOGASTRODUODENOSCOPY (EGD);  Surgeon: Danie Binder, MD;  [facility];  Service: Endoscopy;  Laterality: N/A;  8:30am  . ESOPHAGOGASTRODUODENOSCOPY N/A 02/24/2018   Procedure: ESOPHAGOGASTRODUODENOSCOPY (EGD);  Surgeon: Danie Binder, MD;  [facility];  Service: Endoscopy;  Laterality: N/A;  11:00am  .  fatty tumor removal from lt groin    . FRACTURE SURGERY Left    hand and left ring finger  . GIVENS CAPSULE STUDY N/A 12/26/2017   Procedure: GIVENS CAPSULE STUDY;  Surgeon: Danie Binder, MD;  [facility];  Service: Endoscopy;  Laterality: N/A;  7:30am  . GIVENS CAPSULE STUDY N/A 02/24/2018   Procedure: GIVENS CAPSULE STUDY;  Surgeon: Danie Binder, MD;  [facility];  Service: Endoscopy;  Laterality: N/A;  . KNEE SURGERY     patella tendon repair june 2019 [facility]  . LUNG CANCER  SURGERY  12/2010   Left VATS, minithoracotomy, LLL superior segmentectomy  . ORIF PATELLA Right 03/18/2018   Procedure: OPEN REDUCTION INTERNAL (ORIF) FIXATION RIGHT PATELLA;  Surgeon: Carole Civil, MD;  [facility];  Service: Orthopedics;  Laterality: Right;  . ORIF WRIST FRACTURE Left 04/09/2014   Procedure: OPEN REDUCTION INTERNAL FIXATION (ORIF) WRIST FRACTURE;  Surgeon: Renette Butters, MD;  Location: [place];  Service: Orthopedics;  Laterality: Left;  . PERCUTANEOUS PINNING Left 04/09/2014   Procedure: PERCUTANEOUS PINNING EXTREMITY;  Surgeon: Renette Butters, MD;  Location: [place];  Service: Orthopedics;  Laterality: Left;  . SAVORY DILATION N/A 12/13/2017   Procedure: SAVORY DILATION;  Surgeon: Danie Binder, MD;  [facility];  Service: Endoscopy;  Laterality: N/A;  . TUBAL LIGATION    . vocal cord surgery  02/06/2011   laryngoscopy with bilateral vocal cord Radiesse injection for vocal cord paralysis  . YAG LASER APPLICATION Left 16/38/4665   Procedure: YAG LASER APPLICATION;  Surgeon: Rutherford Guys, MD;  [facility];  Service: Ophthalmology;  Laterality: Left;   Current Outpatient Medications on File Prior to Visit  Medication Sig Dispense Refill  . acetaminophen (TYLENOL) 325 MG tablet Take 650 mg by mouth every 6 (six) hours as needed for moderate pain or headache.     . ALPRAZolam (XANAX) 0.5 MG tablet TAKE (1) TABLET BY MOUTH TWICE A DAY AS NEEDED. (Patient taking differently: Take 0.5 mg by mouth at bedtime. TAKE (1) TABLET BY MOUTH TWICE A DAY AS NEEDED.) 60 tablet 0  . Calcium-Magnesium-Vitamin D (CITRACAL CALCIUM+D) 600-40-500 MG-MG-UNIT TB24 Take 1 tablet by mouth daily.    . citalopram (CELEXA) 40 MG tablet Take 0.5 tablets (20 mg total) by mouth at bedtime. 90 tablet 4  . denosumab (PROLIA) 60 MG/ML SOSY injection Inject 60 mg into the skin every 6 (six) months. 1 Syringe 0  . ferrous sulfate 325 (65 FE) MG tablet Take 325 mg by mouth daily with  breakfast.    . ibuprofen (ADVIL,MOTRIN) 200 MG tablet Take 200 mg by mouth every 6 (six) hours as needed for headache or mild pain.    Marland Kitchen levothyroxine (SYNTHROID, LEVOTHROID) 50 MCG tablet Take 1 tablet (50 mcg total) by mouth daily before breakfast. 90 tablet 3  . magnesium oxide (MAG-OX) 400 MG tablet Take 400 mg by mouth every evening.     . Multiple Vitamin (MULTIVITAMIN WITH MINERALS) TABS tablet Take 1 tablet by mouth daily.    Marland Kitchen omeprazole (PRILOSEC) 20 MG capsule 1 PO 30 MINS PRIOR TO BREAKFAST. (Patient taking differently: Take 20 mg by mouth daily. ) 90 capsule 3  . ondansetron (ZOFRAN ODT) 4 MG disintegrating tablet 4mg  ODT q4 hours prn nausea/vomit 6 tablet 0  . TRULANCE 3 MG TABS Take 3 mg by mouth every morning.    . umeclidinium-vilanterol (ANORO ELLIPTA) 62.5-25 MCG/INH AEPB Inhale 1 puff  into the lungs daily. 90 each 3   No current facility-administered medications on file prior to visit.    Allergies  Allergen Reactions  . Ciprofloxacin Hcl Hives  . Iohexol Other (See Comments)     Desc: Per [facility], pt is allergic to IV contrast, no type of reaction was available. Pt cannot remember but first reacted around 15 yrs ago from an IVP. Notes were date from 2009 at urology center., Onset Date: [id number]   . Prozac [Fluoxetine Hcl] Rash  . Sulfonamide Derivatives Hives and Rash   Social History   Socioeconomic History  . Marital status: Divorced    Spouse name: Not on file  . Number of children: Not on file  . Years of education: Not on file  . Highest education level: Not on file  Occupational History  . Not on file  Social Needs  . Financial resource strain: Somewhat hard  . Food insecurity:    Worry: Never true    Inability: Never true  . Transportation needs:    Medical: No    Non-medical: No  Tobacco Use  . Smoking status: Former Smoker    Packs/day: 0.50    Years: 20.00    Pack years: 10.00    Types: Cigarettes    Last attempt to quit: 12/21/2010     Years since quitting: 7.6  . Smokeless tobacco: Never Used  . Tobacco comment: smoking cessation info given and reviewed   Substance and Sexual Activity  . Alcohol use: Yes    Alcohol/week: 0.0 standard drinks    Comment: seldom  . Drug use: No  . Sexual activity: Yes    Birth control/protection: Surgical  Lifestyle  . Physical activity:    Days per week: 0 days    Minutes per session: 0 min  . Stress: Not at all  Relationships  . Social connections:    Talks on phone: Once a week    Gets together: Never    Attends religious service: Never    Active member of club or organization: No    Attends meetings of clubs or organizations: Never    Relationship status: Divorced  . Intimate partner violence:    Fear of current or ex partner: No    Emotionally abused: No    Physically abused: No    Forced sexual activity: No  Other Topics Concern  . Not on file  Social History Narrative  . Not on file     Past Medical History:  Diagnosis Date  . Adenocarcinoma of lung ([place]) 12/2010   left lung/surg only  . Allergic rhinitis   . 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 Anxiety disorder   . Aortic aneurysm ([place])   . Complication of anesthesia 2011   bloodpressure dropped during colonoscopy, not problems since  . Compression fracture   . COPD (chronic obstructive pulmonary disease) ([place])   . Depression   . Diastolic dysfunction   . Diverticulosis   . Dysphagia   . Emphysema lung ([place]) 11/08/2014  . Hiatal hernia   . Hypothyroidism   . IBS (irritable bowel syndrome)   . Iron deficiency anemia due to chronic blood loss 12/05/2016  . On home O2 12/15/2013   chronic hypoxia  . PUD (peptic ulcer disease)    13yrs  . Shortness of breath    with exertion  . SIADH (syndrome of inappropriate ADH production) ([place])   . Trigeminal neuralgia  Past Surgical History:  Procedure Laterality Date  . ABDOMINAL HYSTERECTOMY    . BACK SURGERY  January 13, 2014  . CATARACT EXTRACTION    . CATARACT EXTRACTION W/PHACO  09/02/2012   Procedure: CATARACT EXTRACTION PHACO AND INTRAOCULAR LENS PLACEMENT (IOC);  Surgeon: Elta Guadeloupe T. Gershon Crane, MD;  [facility];  Service: Ophthalmology;  Laterality: Left;  CDE:10.35  . CHOLECYSTECTOMY    . 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 COLONOSCOPY N/A 11/23/2014   Dr. Oneida Alar: moderate diverticulosis, hemorrhoids, redundant colon. next TCS in 10-15 years.   Marland Kitchen ENDARTERECTOMY Right 08/21/2013   Procedure: RIGHT CAROTID ANEURYSM RESECTION;  Surgeon: Rosetta Posner, MD;  Location: [facility];  Service: Vascular;  Laterality: Right;  . ESOPHAGEAL DILATION  02/24/2018   Procedure: ESOPHAGEAL DILATION;  Surgeon: Danie Binder, MD;  [facility];  Service: Endoscopy;;  . ESOPHAGOGASTRODUODENOSCOPY  11/13/2009   w/dilation to 65mm, gastric ulceration (H.Pylori) s/p treatment  . ESOPHAGOGASTRODUODENOSCOPY  11/21/2009   distal esophageal web, gastritis  . ESOPHAGOGASTRODUODENOSCOPY  03/14/12   JAS:[id number] in the distal esophagus/Mild gastritis/small HH. + H.pylori, prescribed Pylera. Finished treatment.   . ESOPHAGOGASTRODUODENOSCOPY N/A 12/13/2017   Procedure: ESOPHAGOGASTRODUODENOSCOPY (EGD);  Surgeon: Danie Binder, MD;  [facility];  Service: Endoscopy;  Laterality: N/A;  8:30am  . ESOPHAGOGASTRODUODENOSCOPY N/A 02/24/2018   Procedure: ESOPHAGOGASTRODUODENOSCOPY (EGD);  Surgeon: Danie Binder, MD;  [facility];  Service: Endoscopy;  Laterality: N/A;  11:00am  . fatty tumor removal from lt groin    . FRACTURE SURGERY Left    hand and left ring finger  . GIVENS CAPSULE STUDY N/A 12/26/2017   Procedure: GIVENS CAPSULE STUDY;  Surgeon: Danie Binder, MD;  [facility];  Service: Endoscopy;  Laterality: N/A;  7:30am  . GIVENS CAPSULE STUDY N/A 02/24/2018   Procedure: GIVENS CAPSULE STUDY;  Surgeon: Danie Binder, MD;  [facility];   Service: Endoscopy;  Laterality: N/A;  . KNEE SURGERY     patella tendon repair june 2019 [facility]  . LUNG CANCER SURGERY  12/2010   Left VATS, minithoracotomy, LLL superior segmentectomy  . ORIF PATELLA Right 03/18/2018   Procedure: OPEN REDUCTION INTERNAL (ORIF) FIXATION RIGHT PATELLA;  Surgeon: Carole Civil, MD;  [facility];  Service: Orthopedics;  Laterality: Right;  . ORIF WRIST FRACTURE Left 04/09/2014   Procedure: OPEN REDUCTION INTERNAL FIXATION (ORIF) WRIST FRACTURE;  Surgeon: Renette Butters, MD;  Location: [place];  Service: Orthopedics;  Laterality: Left;  . PERCUTANEOUS PINNING Left 04/09/2014   Procedure: PERCUTANEOUS PINNING EXTREMITY;  Surgeon: Renette Butters, MD;  Location: [place];  Service: Orthopedics;  Laterality: Left;  . SAVORY DILATION N/A 12/13/2017   Procedure: SAVORY DILATION;  Surgeon: Danie Binder, MD;  [facility];  Service: Endoscopy;  Laterality: N/A;  . TUBAL LIGATION    . vocal cord surgery  02/06/2011   laryngoscopy with bilateral vocal cord Radiesse injection for vocal cord paralysis  . YAG LASER APPLICATION Left 41/93/7902   Procedure: YAG LASER APPLICATION;  Surgeon: Rutherford Guys, MD;  [facility];  Service: Ophthalmology;  Laterality: Left;   Current Outpatient Medications on File Prior to Visit  Medication Sig Dispense Refill  . acetaminophen (TYLENOL) 325 MG tablet Take 650 mg by mouth every 6 (six) hours as needed for moderate pain or headache.     . ALPRAZolam (XANAX) 0.5 MG tablet TAKE (  1) TABLET BY MOUTH TWICE A DAY AS NEEDED. (Patient taking differently: Take 0.5 mg by mouth at bedtime. TAKE (1) TABLET BY MOUTH TWICE A DAY AS NEEDED.) 60 tablet 0  . Calcium-Magnesium-Vitamin D (CITRACAL CALCIUM+D) 600-40-500 MG-MG-UNIT TB24 Take 1 tablet by mouth daily.    . citalopram (CELEXA) 40 MG tablet Take 0.5 tablets (20 mg total) by mouth at bedtime. 90 tablet 4  . denosumab (PROLIA) 60 MG/ML SOSY injection Inject 60 mg into the skin  every 6 (six) months. 1 Syringe 0  . ferrous sulfate 325 (65 FE) MG tablet Take 325 mg by mouth daily with breakfast.    . ibuprofen (ADVIL,MOTRIN) 200 MG tablet Take 200 mg by mouth every 6 (six) hours as needed for headache or mild pain.    Marland Kitchen levothyroxine (SYNTHROID, LEVOTHROID) 50 MCG tablet Take 1 tablet (50 mcg total) by mouth daily before breakfast. 90 tablet 3  . magnesium oxide (MAG-OX) 400 MG tablet Take 400 mg by mouth every evening.     . Multiple Vitamin (MULTIVITAMIN WITH MINERALS) TABS tablet Take 1 tablet by mouth daily.    Marland Kitchen omeprazole (PRILOSEC) 20 MG capsule 1 PO 30 MINS PRIOR TO BREAKFAST. (Patient taking differently: Take 20 mg by mouth daily. ) 90 capsule 3  . ondansetron (ZOFRAN ODT) 4 MG disintegrating tablet 4mg  ODT q4 hours prn nausea/vomit 6 tablet 0  . TRULANCE 3 MG TABS Take 3 mg by mouth every morning.    . umeclidinium-vilanterol (ANORO ELLIPTA) 62.5-25 MCG/INH AEPB Inhale 1 puff into the lungs daily. 90 each 3   No current facility-administered medications on file prior to visit.    Allergies  Allergen Reactions  . Ciprofloxacin Hcl Hives  . Iohexol Other (See Comments)     Desc: [facility], pt is allergic to IV contrast, no type of reaction was available. Pt cannot remember but first reacted around 15 yrs ago from an IVP. Notes were date from 2009 at urology center., Onset Date: [id number]   . Prozac [Fluoxetine Hcl] Rash  . Sulfonamide Derivatives Hives and Rash   Social History   Socioeconomic History  . Marital status: Divorced    Spouse name: Not on file  . Number of children: Not on file  . Years of education: Not on file  . Highest education level: Not on file  Occupational History  . Not on file  Social Needs  . Financial resource strain: Somewhat hard  . Food insecurity:    Worry: Never true    Inability: Never true  . Transportation needs:    Medical: No    Non-medical: No  Tobacco Use  . Smoking status: Former Smoker     Packs/day: 0.50    Years: 20.00    Pack years: 10.00    Types: Cigarettes    Last attempt to quit: 12/21/2010    Years since quitting: 7.6  . Smokeless tobacco: Never Used  . Tobacco comment: smoking cessation info given and reviewed   Substance and Sexual Activity  . Alcohol use: Yes    Alcohol/week: 0.0 standard drinks    Comment: seldom  . Drug use: No  . Sexual activity: Yes    Birth control/protection: Surgical  Lifestyle  . Physical activity:    Days per week: 0 days    Minutes per session: 0 min  . Stress: Not at all  Relationships  . Social connections:    Talks on phone: Once a week    Gets together: Never  Attends religious service: Never    Active member of club or organization: No    Attends meetings of clubs or organizations: Never    Relationship status: Divorced  . Intimate partner violence:    Fear of current or ex partner: No    Emotionally abused: No    Physically abused: No    Forced sexual activity: No  Other Topics Concern  . Not on file  Social History Narrative  . Not on file    Review of Systems  All other systems reviewed and are negative.      Objective:   Physical Exam  Constitutional: She appears well-developed and well-nourished. No distress.  Neck: No JVD present. No thyromegaly present.  Cardiovascular: Normal rate, regular rhythm and normal heart sounds.  No murmur heard. Pulmonary/Chest: Effort normal. She has wheezes.  Abdominal: Soft. Bowel sounds are normal. She exhibits no distension. There is no tenderness. There is no rebound and no guarding.  Musculoskeletal: She exhibits no edema.  Lymphadenopathy:    She has no cervical adenopathy.  Skin: She is not diaphoretic.     MRI of the brain in 2014 revealed_ IMPRESSION: 1. 26 mm right neck mass near the angle of the mandible, favor a right carotid artery aneurysm. Recommend followup neck CTA with IV contrast.  2. Otherwise negative trigeminal nerve imaging.        Assessment & Plan:  Trigeminal neuralgia I will start the patient on Tegretol 100 mg p.o. twice daily.  She can use Percocet 5/325 1 p.o. every 4 hours as needed pain for breakthrough pain.  Recheck next week.  Uptitrate Tegretol as tolerated to 200 mg twice daily

## 2018-08-07 ENCOUNTER — Other Ambulatory Visit: Payer: Self-pay | Admitting: Physician Assistant

## 2018-08-07 NOTE — Telephone Encounter (Signed)
Last OV 08/05/2018 Last refill 06/20/2018 Ok to refill?

## 2018-08-12 ENCOUNTER — Encounter: Payer: Self-pay | Admitting: Family Medicine

## 2018-08-13 ENCOUNTER — Encounter: Payer: Self-pay | Admitting: Family Medicine

## 2018-08-13 ENCOUNTER — Other Ambulatory Visit: Payer: Self-pay | Admitting: Family Medicine

## 2018-08-13 DIAGNOSIS — Z1231 Encounter for screening mammogram for malignant neoplasm of breast: Secondary | ICD-10-CM

## 2018-08-22 IMAGING — DX DG CHEST 2V
2 series · 2 of 2 positions shown · non-contrast
Comparison: 09/21/2016

CLINICAL DATA: Increased shortness of breath with weakness today.
History of lung cancer.

EXAM:
CHEST  2 VIEW

[chest lat]
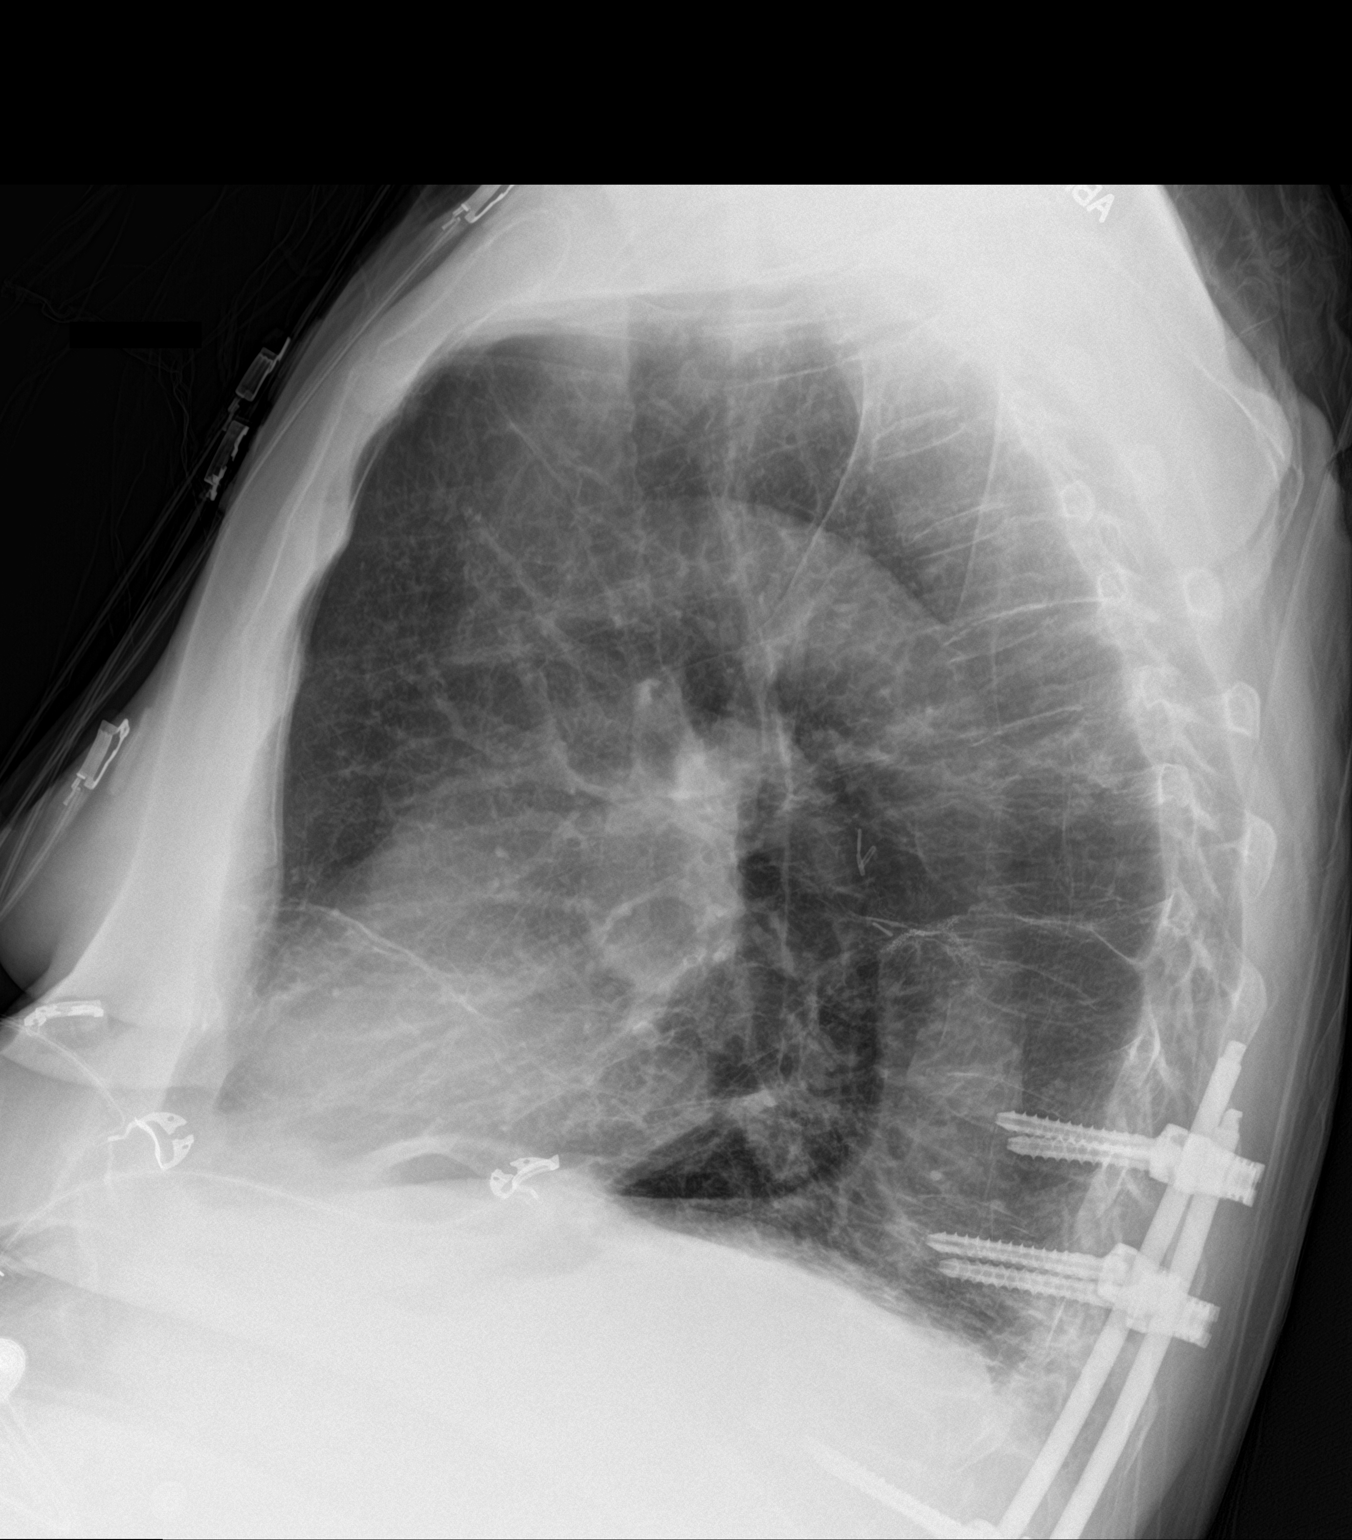

[chest ap]
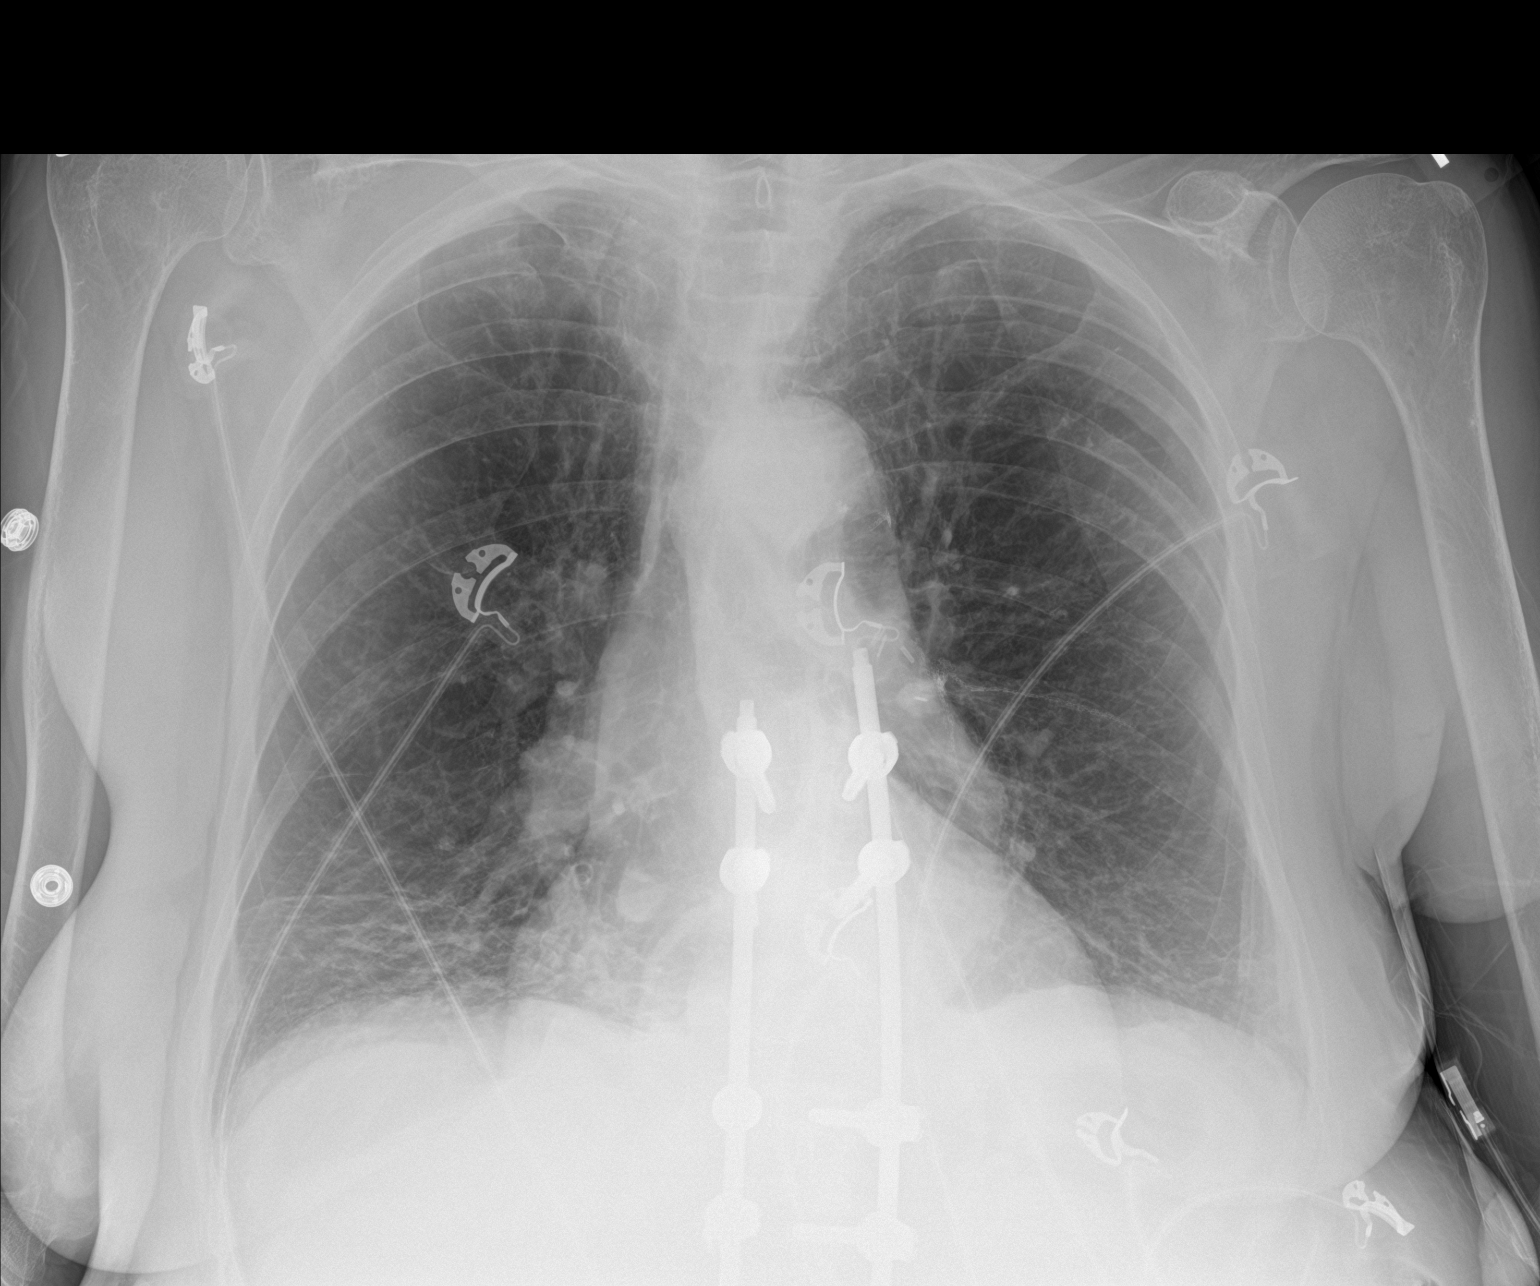

[2 of 2 positions shown; findings below may reference images not displayed]

FINDINGS: Shallow inspiration with underlying emphysematous changes. Linear
fibrosis or atelectasis in the lung bases similar previous study.
Normal heart size and pulmonary vascularity. No focal airspace
disease or consolidation the lungs. Surgical clips in the left mid
lung. No blunting of costophrenic angles. No pneumothorax. Calcified
and tortuous aorta. Esophageal hiatal hernia behind the heart.
Postoperative changes in the thoracolumbar spine. Multiple thoracic
vertebral compression deformities, similar to prior study.
IMPRESSION: Unchanged appearance since prior study. Emphysematous changes in the
lungs with linear atelectasis or fibrosis in the lung bases.
Esophageal hiatal hernia behind the heart. No focal consolidation.

## 2018-09-10 ENCOUNTER — Ambulatory Visit (HOSPITAL_COMMUNITY)
Admission: RE | Admit: 2018-09-10 | Discharge: 2018-09-10 | Disposition: A | Discharge status: Home / Self Care | Payer: PPO | Source: Ambulatory Visit | Attending: Family Medicine | Admitting: Family Medicine

## 2018-09-10 DIAGNOSIS — Z1231 Encounter for screening mammogram for malignant neoplasm of breast: Secondary | ICD-10-CM

## 2018-09-24 ENCOUNTER — Ambulatory Visit (INDEPENDENT_AMBULATORY_CARE_PROVIDER_SITE_OTHER): Payer: PPO | Admitting: Orthopedic Surgery

## 2018-09-24 ENCOUNTER — Ambulatory Visit (INDEPENDENT_AMBULATORY_CARE_PROVIDER_SITE_OTHER): Payer: PPO

## 2018-09-24 VITALS — BP 123/74 | HR 60 | Ht 66.0 in | Wt 122.0 lb

## 2018-09-24 DIAGNOSIS — Z8781 Personal history of (healed) traumatic fracture: Secondary | ICD-10-CM | POA: Diagnosis not present

## 2018-09-24 DIAGNOSIS — G8929 Other chronic pain: Secondary | ICD-10-CM | POA: Diagnosis not present

## 2018-09-24 DIAGNOSIS — M25551 Pain in right hip: Secondary | ICD-10-CM

## 2018-09-24 DIAGNOSIS — Z9889 Other specified postprocedural states: Secondary | ICD-10-CM

## 2018-09-24 NOTE — Progress Notes (Signed)
Progress Note   Patient ID: [name], female   DOB: [birth year], 77 y.o.   MRN: [id number]   Chief Complaint  Patient presents with  . Post-op Follow-up    Recheck on Right patella, S/P ORIF, DOS 03-18-18.  Marland Kitchen Hip Pain    right hip clicks     77 year old female status post open treatment internal fixation right patella on March 18, 2018 she is doing very well she is walking with a walker however she cannot resume her glider which is causing her to have right leg pain running from the knee to the hip and sometimes down to the foot  She has a history of spinal fixation which we evaluated last time for complaints of possible loose screws however we did not find any screws loose fusion looks solid hardware looked normal  She says her primary care physician told her she might need a hip replacement so we are going to look at her hip as she does complain of some right chronic hip pain    Review of Systems  Musculoskeletal: Positive for back pain and joint pain.  Neurological: Negative for tingling.     Allergies  Allergen Reactions  . Ciprofloxacin Hcl Hives  . Iohexol Other (See Comments)     Desc: Per [facility], pt is allergic to IV contrast, no type of reaction was available. Pt cannot remember but first reacted around 15 yrs ago from an IVP. Notes were date from 2009 at urology center., Onset Date: [id number]   . Prozac [Fluoxetine Hcl] Rash  . Sulfonamide Derivatives Hives and Rash     BP 123/74   Pulse 60   Ht 5\' 6"  (1.676 m)   Wt 122 lb (55.3 kg)   BMI 19.69 kg/m   Physical Exam Vitals signs reviewed.  Constitutional:      General: She is not in acute distress.    Appearance: Normal appearance. She is well-developed.  Musculoskeletal:       Legs:  Neurological:     General: No focal deficit present.     Mental Status: She is alert and oriented to person, place, and time.     Sensory: No sensory deficit.     Motor: No weakness.     Gait: Gait abnormal.    Psychiatric:        Mood and Affect: Mood normal.        Behavior: Behavior normal.        Thought Content: Thought content normal.        Judgment: Judgment normal.      Medical decisions:   Data  Imaging:   AP pelvis AP lateral right hip mild degenerative changes and joint space narrowing without osteophyte formation right and left hip weightbearing area shows some narrowing but more central narrowing is noted on the dedicated AP lateral of the hip  Encounter Diagnoses  Name Primary?  . S/P ORIF (open reduction internal fixation) fracture right patella 03/18/18 Yes  . Chronic right hip pain     PLAN:   Recommend activity modification with quadricep strengthening exercises  X-ray right knee 1 year   Arther Abbott, MD 09/24/2018 12:04 PM

## 2018-10-10 ENCOUNTER — Other Ambulatory Visit: Payer: Self-pay | Admitting: Family Medicine

## 2018-10-10 NOTE — Telephone Encounter (Signed)
Requesting refill    xanax  LOV: 08/05/18  LRF: 08/07/18

## 2018-11-03 ENCOUNTER — Encounter: Payer: Self-pay | Admitting: Family Medicine

## 2018-11-06 ENCOUNTER — Inpatient Hospital Stay (HOSPITAL_COMMUNITY): Payer: PPO | Attending: Hematology

## 2018-11-06 ENCOUNTER — Ambulatory Visit (HOSPITAL_COMMUNITY)
Admission: RE | Admit: 2018-11-06 | Discharge: 2018-11-06 | Disposition: A | Discharge status: Home / Self Care | Payer: PPO | Source: Ambulatory Visit | Attending: Internal Medicine | Admitting: Internal Medicine

## 2018-11-06 DIAGNOSIS — R918 Other nonspecific abnormal finding of lung field: Secondary | ICD-10-CM | POA: Diagnosis not present

## 2018-11-06 DIAGNOSIS — D509 Iron deficiency anemia, unspecified: Secondary | ICD-10-CM | POA: Diagnosis not present

## 2018-11-06 DIAGNOSIS — D500 Iron deficiency anemia secondary to blood loss (chronic): Secondary | ICD-10-CM

## 2018-11-06 DIAGNOSIS — R911 Solitary pulmonary nodule: Secondary | ICD-10-CM

## 2018-11-06 DIAGNOSIS — C3492 Malignant neoplasm of unspecified part of left bronchus or lung: Secondary | ICD-10-CM

## 2018-11-06 LAB — COMPREHENSIVE METABOLIC PANEL
ALT: 15 U/L (ref 0–44)
AST: 16 U/L (ref 15–41)
Albumin: 4.7 g/dL (ref 3.5–5.0)
Alkaline Phosphatase: 35 U/L — ABNORMAL LOW (ref 38–126)
Anion gap: 9 (ref 5–15)
BUN: 13 mg/dL (ref 8–23)
CO2: 28 mmol/L (ref 22–32)
Calcium: 8.8 mg/dL — ABNORMAL LOW (ref 8.9–10.3)
Chloride: 96 mmol/L — ABNORMAL LOW (ref 98–111)
Creatinine, Ser: 0.5 mg/dL (ref 0.44–1.00)
GFR calc Af Amer: >60 mL/min (ref >60)
GFR calc non Af Amer: >60 mL/min (ref >60)
Glucose, Bld: 89 mg/dL (ref 70–99)
Potassium: 4.7 mmol/L (ref 3.5–5.1)
Sodium: 133 mmol/L — ABNORMAL LOW (ref 135–145)
Total Bilirubin: 0.6 mg/dL (ref 0.3–1.2)
Total Protein: 7.6 g/dL (ref 6.5–8.1)

## 2018-11-06 LAB — CBC WITH DIFFERENTIAL/PLATELET
Abs Immature Granulocytes: 0.01 10*3/uL (ref 0.00–0.07)
Basophils Absolute: 0 10*3/uL (ref 0.0–0.1)
Basophils Relative: 1 %
Eosinophils Absolute: 0.2 10*3/uL (ref 0.0–0.5)
Eosinophils Relative: 3 %
HCT: 44 % (ref 36.0–46.0)
Hemoglobin: 14.3 g/dL (ref 12.0–15.0)
Immature Granulocytes: 0 %
Lymphocytes Relative: 24 %
Lymphs Abs: 1.5 10*3/uL (ref 0.7–4.0)
MCH: 29.5 pg (ref 26.0–34.0)
MCHC: 32.5 g/dL (ref 30.0–36.0)
MCV: 90.9 fL (ref 80.0–100.0)
Monocytes Absolute: 0.5 10*3/uL (ref 0.1–1.0)
Monocytes Relative: 8 %
Neutro Abs: 4 10*3/uL (ref 1.7–7.7)
Neutrophils Relative %: 64 %
Platelets: 290 10*3/uL (ref 150–400)
RBC: 4.84 MIL/uL (ref 3.87–5.11)
RDW: 12 % (ref 11.5–15.5)
WBC: 6.1 10*3/uL (ref 4.0–10.5)
nRBC: 0 % (ref 0.0–0.2)

## 2018-11-06 LAB — FERRITIN: Ferritin: 55 ng/mL (ref 11–307)

## 2018-11-06 LAB — LACTATE DEHYDROGENASE: LDH: 142 U/L (ref 98–192)

## 2018-11-11 ENCOUNTER — Ambulatory Visit (HOSPITAL_COMMUNITY): Payer: PPO | Admitting: Hematology

## 2018-11-11 ENCOUNTER — Ambulatory Visit (HOSPITAL_COMMUNITY): Payer: PPO

## 2018-11-13 ENCOUNTER — Other Ambulatory Visit: Payer: Self-pay

## 2018-11-13 ENCOUNTER — Encounter (HOSPITAL_COMMUNITY): Payer: Self-pay | Admitting: Hematology

## 2018-11-13 ENCOUNTER — Inpatient Hospital Stay (HOSPITAL_COMMUNITY): Payer: PPO | Attending: Hematology | Admitting: Hematology

## 2018-11-13 VITALS — BP 116/66 | HR 67 | Temp 98.2°F | Resp 16 | Wt 126.5 lb

## 2018-11-13 DIAGNOSIS — E611 Iron deficiency: Secondary | ICD-10-CM | POA: Diagnosis not present

## 2018-11-13 DIAGNOSIS — Z85118 Personal history of other malignant neoplasm of bronchus and lung: Secondary | ICD-10-CM | POA: Diagnosis not present

## 2018-11-13 DIAGNOSIS — C3492 Malignant neoplasm of unspecified part of left bronchus or lung: Secondary | ICD-10-CM

## 2018-11-13 NOTE — Assessment & Plan Note (Addendum)
1.  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 - Last CT scan on 05/07/2018 shows stable postsurgical changes in the left lower lobe.  Stable 4 mm groundglass nodule in the posterior aspect of the right lower lobe.  Slight increase in pericardial effusion versus pericardial thickening.  Stable compression fractures of the T6, T8, T12 and L1 vertebral bodies. -She denies any recent infections or hospitalizations.  No change in her cough or hemoptysis noted. - We reviewed the results of the CT chest without contrast dated 11/06/2018 which shows no recurrent or metastatic disease in the left lower lobe.  Similar right lower lobe pulmonary nodule.  Similar thoracic compression deformities and surgical changes. - She continues to be on continuous oxygen.  Physical exam was normal.  I have recommended follow-up visit in 1 year with repeat CT of the chest without contrast.  2.  Iron deficiency state: -Last Feraheme infusion was on 11/21/2017. - Ferritin today is 54.  Hemoglobin is normal at 14.

## 2018-11-13 NOTE — Patient Instructions (Addendum)
[place] at [facility] Discharge Instructions   Follow up in 1 year with a CT scan and labs   Thank you for choosing [place] at [facility] to provide your oncology and hematology care.  To afford each patient quality time with our provider, please arrive at least 15 minutes before your scheduled appointment time.   If you have a lab appointment with [facility] please come in thru the  Main Entrance and check in at the main information desk  You need to re-schedule your appointment should you arrive 10 or more minutes late.  We strive to give you quality time with our providers, and arriving late affects you and other patients whose appointments are after yours.  Also, if you no show three or more times for appointments you may be dismissed from the clinic at the providers discretion.     Again, thank you for choosing [facility].  Our hope is that these requests will decrease the amount of time that you wait before being seen by our physicians.       _____________________________________________________________  Should you have questions after your visit to [facility], please contact our office at [phone] between the hours of 8:00 a.m. and 4:30 p.m.  Voicemails left after 4:00 p.m. will not be returned until the following business day.  For prescription refill requests, have your pharmacy contact our office and allow 72 hours.    Cancer Center Support Programs:   > Cancer Support Group  2nd Tuesday of the month 1pm-2pm, Journey Room

## 2018-11-13 NOTE — Progress Notes (Signed)
[place], [place] [address]   CLINIC:  Medical Oncology/Hematology  PCP:  [name], MD [address] [place] [address] [phone]   REASON FOR VISIT: Follow-up for adenocarcinoma of the left lung AND iron deficiency anemia  CURRENT THERAPY: Observation AND intermittent iron infusions   BRIEF ONCOLOGIC HISTORY:    Adenocarcinoma of lung ([place])   12/21/2010 Initial Diagnosis    S/P left lower lobe resection for a well-differentiated Stage IB bronchoalveolar cancer. 0/14 lymph nodes.  No perineural invasion or LVI.  negative margins.    06/05/2016 Imaging    Although the previously described ground-glass attenuation nodule in the superior segment of the right lower lobe measures slightly larger on today's examination than prior studies, this is favored to be technique related. Today's study should serve as a baseline for future followup examinations. At this time, there is no central solid component to this lesion. No new nodules are noted. 2. Stable postoperative scarring in the left lower lobe and post infectious scarring throughout the visualize lung bases, as above. 3. Areas of cylindrical bronchiectasis throughout the lung bases bilaterally     05/09/2017 Imaging    IMPRESSION: 1. Status post left lower lobe wedge resection. No findings to suggest local recurrence of disease or definite metastatic disease in the thorax. Previously noted ground-glass attenuation nodule in the superior segment of the right lower lobe appears slightly smaller than the prior study, suggesting a benign etiology. 2. Diffuse bronchial wall thickening with moderate to severe centrilobular and paraseptal emphysema; imaging findings suggestive of underlying COPD. 3. Aortic atherosclerosis, in addition to left main and left circumflex coronary artery disease. Assessment for potential risk factor modification, dietary therapy or  pharmacologic therapy may be warranted, if clinically indicated. 4. Increasing anterior pericardial fluid and/or thickening. This is unlikely to be of hemodynamic significance at this time. No associated pericardial calcification. 5. New (but non acute) compression fracture of T6 with approximately 90% loss of anterior vertebral body height, in addition to multiple other chronic compression fractures of the visualize the thoracolumbar spine.      CANCER STAGING: Cancer Staging Adenocarcinoma of lung [facility]) Staging form: Lung, AJCC 7th Edition - Clinical: Stage IB - Signed by Baird Cancer, PA on 08/01/2012    INTERVAL HISTORY:  Ms. Majette 78 y.o. female returns for routine follow-up for adenocarcinoma of the left lung and iron deficiency anemia. She is doing well today. She is on continuous oxygen at home. She walks with her walker for stability. Sh ehas fallen and broken a few bones. She takes her daily calcium and magnesium. Denies any nausea, vomiting, or diarrhea. Denies any new pains. Had not noticed any recent bleeding such as epistaxis, hematuria or hematochezia. Denies recent chest pain on exertion, shortness of breath on minimal exertion, pre-syncopal episodes, or palpitations. Denies any numbness or tingling in hands or feet. Denies any recent fevers, infections, or recent hospitalizations. Patient reports appetite at 100% and energy level at 100%.    REVIEW OF SYSTEMS:  Review of Systems  All other systems reviewed and are negative.    PAST MEDICAL/SURGICAL HISTORY:  Past Medical History:  Diagnosis Date  . Adenocarcinoma of lung ([place]) 12/2010   left lung/surg only  . Allergic rhinitis   . 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 Anxiety disorder   . Aortic aneurysm ([place])   . Complication of anesthesia 2011  bloodpressure dropped during colonoscopy, not problems since  . Compression fracture   . COPD (chronic obstructive pulmonary  disease) ([place])   . Depression   . Diastolic dysfunction   . Diverticulosis   . Dysphagia   . Emphysema lung ([place]) 11/08/2014  . Hiatal hernia   . Hypothyroidism   . IBS (irritable bowel syndrome)   . Iron deficiency anemia due to chronic blood loss 12/05/2016  . On home O2 12/15/2013   chronic hypoxia  . PUD (peptic ulcer disease)    63yrs  . Shortness of breath    with exertion  . SIADH (syndrome of inappropriate ADH production) ([place])   . Trigeminal neuralgia    Past Surgical History:  Procedure Laterality Date  . ABDOMINAL HYSTERECTOMY    . BACK SURGERY  January 13, 2014  . CATARACT EXTRACTION    . CATARACT EXTRACTION W/PHACO  09/02/2012   Procedure: CATARACT EXTRACTION PHACO AND INTRAOCULAR LENS PLACEMENT (IOC);  Surgeon: Elta Guadeloupe T. Gershon Crane, MD;  Location: [facility];  Service: Ophthalmology;  Laterality: Left;  CDE:10.35  . CHOLECYSTECTOMY    . 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 COLONOSCOPY N/A 11/23/2014   Dr. Oneida Alar: moderate diverticulosis, hemorrhoids, redundant colon. next TCS in 10-15 years.   Marland Kitchen ENDARTERECTOMY Right 08/21/2013   Procedure: RIGHT CAROTID ANEURYSM RESECTION;  Surgeon: Rosetta Posner, MD;  Location: [place];  Service: Vascular;  Laterality: Right;  . ESOPHAGEAL DILATION  02/24/2018   Procedure: ESOPHAGEAL DILATION;  Surgeon: Danie Binder, MD;  [facility];  Service: Endoscopy;;  . ESOPHAGOGASTRODUODENOSCOPY  11/13/2009   w/dilation to 15mm, gastric ulceration (H.Pylori) s/p treatment  . ESOPHAGOGASTRODUODENOSCOPY  11/21/2009   distal esophageal web, gastritis  . ESOPHAGOGASTRODUODENOSCOPY  03/14/12   YIR:SWNIOEVOJ in the distal esophagus/Mild gastritis/small HH. + H.pylori, prescribed Pylera. Finished treatment.   . ESOPHAGOGASTRODUODENOSCOPY N/A 12/13/2017   Procedure: ESOPHAGOGASTRODUODENOSCOPY (EGD);  Surgeon: Danie Binder, MD;  [facility];  Service: Endoscopy;  Laterality: N/A;  8:30am  .  ESOPHAGOGASTRODUODENOSCOPY N/A 02/24/2018   Procedure: ESOPHAGOGASTRODUODENOSCOPY (EGD);  Surgeon: Danie Binder, MD;  [facility];  Service: Endoscopy;  Laterality: N/A;  11:00am  . fatty tumor removal from lt groin    . FRACTURE SURGERY Left    hand and left ring finger  . GIVENS CAPSULE STUDY N/A 12/26/2017   Procedure: GIVENS CAPSULE STUDY;  Surgeon: Danie Binder, MD;  [facility];  Service: Endoscopy;  Laterality: N/A;  7:30am  . GIVENS CAPSULE STUDY N/A 02/24/2018   Procedure: GIVENS CAPSULE STUDY;  Surgeon: Danie Binder, MD;  [facility];  Service: Endoscopy;  Laterality: N/A;  . KNEE SURGERY     patella tendon repair june 2019 [facility]  . LUNG CANCER SURGERY  12/2010   Left VATS, minithoracotomy, LLL superior segmentectomy  . ORIF PATELLA Right 03/18/2018   Procedure: OPEN REDUCTION INTERNAL (ORIF) FIXATION RIGHT PATELLA;  Surgeon: Carole Civil, MD;  Location: [facility];  Service: Orthopedics;  Laterality: Right;  . ORIF WRIST FRACTURE Left 04/09/2014   Procedure: OPEN REDUCTION INTERNAL FIXATION (ORIF) WRIST FRACTURE;  Surgeon: Renette Butters, MD;  Location: [facility];  Service: Orthopedics;  Laterality: Left;  . PERCUTANEOUS PINNING Left 04/09/2014   Procedure: PERCUTANEOUS PINNING EXTREMITY;  Surgeon: Renette Butters, MD;  Location: [place];  Service: Orthopedics;  Laterality: Left;  . SAVORY DILATION N/A 12/13/2017   Procedure: SAVORY DILATION;  Surgeon:  Fields, Marga Melnick, MD;  [facility];  Service: Endoscopy;  Laterality: N/A;  . TUBAL LIGATION    . vocal cord surgery  02/06/2011   laryngoscopy with bilateral vocal cord Radiesse injection for vocal cord paralysis  . YAG LASER APPLICATION Left 27/03/2375   Procedure: YAG LASER APPLICATION;  Surgeon: Rutherford Guys, MD;  Location[facility];  Service: Ophthalmology;  Laterality: Left;     SOCIAL HISTORY:  Social History   Socioeconomic History  . Marital status: Divorced    Spouse  name: Not on file  . Number of children: Not on file  . Years of education: Not on file  . Highest education level: Not on file  Occupational History  . Not on file  Social Needs  . Financial resource strain: Somewhat hard  . Food insecurity:    Worry: Never true    Inability: Never true  . Transportation needs:    Medical: No    Non-medical: No  Tobacco Use  . Smoking status: Former Smoker    Packs/day: 0.50    Years: 20.00    Pack years: 10.00    Types: Cigarettes    Last attempt to quit: 12/21/2010    Years since quitting: 7.9  . Smokeless tobacco: Never Used  . Tobacco comment: smoking cessation info given and reviewed   Substance and Sexual Activity  . Alcohol use: Yes    Alcohol/week: 0.0 standard drinks    Comment: seldom  . Drug use: No  . Sexual activity: Yes    Birth control/protection: Surgical  Lifestyle  . Physical activity:    Days per week: 0 days    Minutes per session: 0 min  . Stress: Not at all  Relationships  . Social connections:    Talks on phone: Once a week    Gets together: Never    Attends religious service: Never    Active member of club or organization: No    Attends meetings of clubs or organizations: Never    Relationship status: Divorced  . Intimate partner violence:    Fear of current or ex partner: No    Emotionally abused: No    Physically abused: No    Forced sexual activity: No  Other Topics Concern  . Not on file  Social History Narrative  . Not on file    FAMILY HISTORY:  Family History  Problem Relation Age of Onset  . Pulmonary embolism Mother   . Heart attack Father   . Hypertension Father   . Liver cancer Sister   . Cancer Sister   . Cancer Brother   . Cancer Daughter   . Colon cancer Neg Hx     CURRENT MEDICATIONS:  Outpatient Encounter Medications as of 11/13/2018  Medication Sig Note  . acetaminophen (TYLENOL) 325 MG tablet Take 650 mg by mouth every 6 (six) hours as needed for moderate pain or headache.     . ALPRAZolam (XANAX) 0.5 MG tablet TAKE (1) TABLET BY MOUTH TWICE A DAY AS NEEDED.   . Calcium-Magnesium-Vitamin D (CITRACAL CALCIUM+D) 600-40-500 MG-MG-UNIT TB24 Take 1 tablet by mouth daily.   . citalopram (CELEXA) 40 MG tablet Take 0.5 tablets (20 mg total) by mouth at bedtime.   Marland Kitchen denosumab (PROLIA) 60 MG/ML SOSY injection Inject 60 mg into the skin every 6 (six) months. 06/25/2018: Currently due for injection, patient has at home prepared for administration   . ferrous sulfate 325 (65 FE) MG tablet Take 325 mg by mouth  daily with breakfast.   . levothyroxine (SYNTHROID, LEVOTHROID) 50 MCG tablet Take 1 tablet (50 mcg total) by mouth daily before breakfast.   . magnesium oxide (MAG-OX) 400 MG tablet Take 400 mg by mouth every evening.    . Multiple Vitamin (MULTIVITAMIN WITH MINERALS) TABS tablet Take 1 tablet by mouth daily.   Marland Kitchen omeprazole (PRILOSEC) 20 MG capsule 1 PO 30 MINS PRIOR TO BREAKFAST. (Patient taking differently: Take 20 mg by mouth daily. )   . umeclidinium-vilanterol (ANORO ELLIPTA) 62.5-25 MCG/INH AEPB Inhale 1 puff into the lungs daily.   . [DISCONTINUED] carbamazepine (TEGRETOL) 200 MG tablet Take 0.5 tablets (100 mg total) by mouth 2 (two) times daily.   . [DISCONTINUED] oxyCODONE-acetaminophen (PERCOCET) 5-325 MG tablet Take 1-2 tablets by mouth every 4 (four) hours as needed for severe pain.   . [DISCONTINUED] TRULANCE 3 MG TABS Take 3 mg by mouth every morning.   Marland Kitchen ibuprofen (ADVIL,MOTRIN) 200 MG tablet Take 200 mg by mouth every 6 (six) hours as needed for headache or mild pain.   Marland Kitchen ondansetron (ZOFRAN ODT) 4 MG disintegrating tablet 4mg  ODT q4 hours prn nausea/vomit (Patient not taking: Reported on 11/13/2018)    No facility-administered encounter medications on file as of 11/13/2018.     ALLERGIES:  Allergies  Allergen Reactions  . Ciprofloxacin Hcl Hives  . Iohexol Other (See Comments)     Desc: Per [facility], pt is allergic to IV contrast, no type of  reaction was available. Pt cannot remember but first reacted around 15 yrs ago from an IVP. Notes were date from 2009 at urology center., Onset Date: [id number]   . Prozac [Fluoxetine Hcl] Rash  . Sulfonamide Derivatives Hives and Rash     PHYSICAL EXAM:  ECOG Performance status: 1  Vitals:   11/13/18 1100  BP: 116/66  Pulse: 67  Resp: 16  Temp: 98.2 F (36.8 C)  SpO2: 95%   Filed Weights   11/13/18 1100  Weight: 126 lb 8 oz (57.4 kg)    Physical Exam Constitutional:      Appearance: Normal appearance. She is normal weight.  Musculoskeletal: Normal range of motion.  Skin:    General: Skin is warm and dry.  Neurological:     Mental Status: She is alert and oriented to person, place, and time. Mental status is at baseline.  Psychiatric:        Mood and Affect: Mood normal.        Behavior: Behavior normal.        Thought Content: Thought content normal.        Judgment: Judgment normal.      LABORATORY DATA:  I have reviewed the labs as listed.  CBC    Component Value Date/Time   WBC 6.1 11/06/2018 1020   RBC 4.84 11/06/2018 1020   HGB 14.3 11/06/2018 1020   HCT 44.0 11/06/2018 1020   PLT 290 11/06/2018 1020   MCV 90.9 11/06/2018 1020   MCH 29.5 11/06/2018 1020   MCHC 32.5 11/06/2018 1020   RDW 12.0 11/06/2018 1020   LYMPHSABS 1.5 11/06/2018 1020   MONOABS 0.5 11/06/2018 1020   EOSABS 0.2 11/06/2018 1020   BASOSABS 0.0 11/06/2018 1020   CMP Latest Ref Rng & Units 11/06/2018 06/25/2018 05/06/2018  Glucose 70 - 99 mg/dL 89 111(H) 83  BUN 8 - 23 mg/dL 13 13 12   Creatinine 0.44 - 1.00 mg/dL 0.50 0.54 0.52  Sodium 135 - 145 mmol/L 133(L) 133(L) 132(L)  Potassium 3.5 -  5.1 mmol/L 4.7 4.2 4.2  Chloride 98 - 111 mmol/L 96(L) 94(L) 95(L)  CO2 22 - 32 mmol/L 28 28 30   Calcium 8.9 - 10.3 mg/dL 8.8(L) 9.6 9.9  Total Protein 6.5 - 8.1 g/dL 7.6 7.3 7.8  Total Bilirubin 0.3 - 1.2 mg/dL 0.6 0.8 0.5  Alkaline Phos 38 - 126 U/L 35(L) 38 46  AST 15 - 41 U/L 16 19 18     ALT 0 - 44 U/L 15 17 18        DIAGNOSTIC IMAGING:  I have independently reviewed the scans and discussed with the patient.   I have reviewed Francene Finders, NP's note and agree with the documentation.  I personally performed a face-to-face visit, made revisions and my assessment and plan is as follows.    ASSESSMENT & PLAN:   Adenocarcinoma of lung ([facility]) 1.  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 - Last CT scan on 05/07/2018 shows stable postsurgical changes in the left lower lobe.  Stable 4 mm groundglass nodule in the posterior aspect of the right lower lobe.  Slight increase in pericardial effusion versus pericardial thickening.  Stable compression fractures of the T6, T8, T12 and L1 vertebral bodies. -She denies any recent infections or hospitalizations.  No change in her cough or hemoptysis noted. - We reviewed the results of the CT chest without contrast dated 11/06/2018 which shows no recurrent or metastatic disease in the left lower lobe.  Similar right lower lobe pulmonary nodule.  Similar thoracic compression deformities and surgical changes. - She continues to be on continuous oxygen.  Physical exam was normal.  I have recommended follow-up visit in 1 year with repeat CT of the chest without contrast.  2.  Iron deficiency state: -Last Feraheme infusion was on 11/21/2017. - Ferritin today is 54.  Hemoglobin is normal at 14.      Orders placed this encounter:  Orders Placed This Encounter  Procedures  . CT Chest Wo Contrast  . CBC with Differential/Platelet  . Comprehensive metabolic panel  . Ferritin  . Iron and TIBC  . Lactate dehydrogenase      Derek Jack, MD [place] [phone]

## 2018-12-16 ENCOUNTER — Other Ambulatory Visit: Payer: Self-pay | Admitting: Family Medicine

## 2018-12-16 NOTE — Telephone Encounter (Signed)
Ok to refill Xanax??  Last office visit 08/05/2018.  Last refill 10/10/2018.

## 2018-12-23 ENCOUNTER — Encounter: Payer: Self-pay | Admitting: Family Medicine

## 2018-12-30 ENCOUNTER — Telehealth: Payer: Self-pay | Admitting: Family Medicine

## 2018-12-30 ENCOUNTER — Other Ambulatory Visit: Payer: Self-pay

## 2018-12-30 ENCOUNTER — Ambulatory Visit (INDEPENDENT_AMBULATORY_CARE_PROVIDER_SITE_OTHER): Payer: PPO | Admitting: Family Medicine

## 2018-12-30 DIAGNOSIS — J302 Other seasonal allergic rhinitis: Secondary | ICD-10-CM | POA: Diagnosis not present

## 2018-12-30 MED ORDER — FLUTICASONE PROPIONATE 50 MCG/ACT NA SUSP: 2.0000 | Freq: Every day | NASAL | 6 refills | Status: DC

## 2018-12-30 MED ORDER — DENOSUMAB 60 MG/ML ~~LOC~~ SOSY: 60.0000 mg | PREFILLED_SYRINGE | SUBCUTANEOUS | 1 refills | Status: DC

## 2018-12-30 NOTE — Progress Notes (Signed)
Subjective:    Patient ID: [name], female    DOB: 07-11-1941, 78 y.o.   MRN: [id number]  HPI Patient is being seen today via telephone visit.  Patient is at home.  She consents to be seen via telephone.  Phone call began at 2:47.  Symptoms began a couple weeks ago.  Symptoms include clear rhinorrhea and nasal congestion.  At night, she has postnasal drip that causes her to have a tickle in her throat and an occasional cough.  When she blows her nose, it has a yellowish-green color.  Occasionally she will cough up some trace amounts of yellow-green sputum.  She denies any fever.  She denies any sinus pain either in her maxillary sinuses or frontal sinuses.  She denies any otalgia.  She denies any sore throat.  She denies any shortness of breath or chest pain.  She denies any pleurisy.  She denies any hemoptysis.  Past Medical History:  Diagnosis Date  . Adenocarcinoma of lung ([place]) 12/2010   left lung/surg only  . Allergic rhinitis   . 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 Anxiety disorder   . Aortic aneurysm ([place])   . Complication of anesthesia 2011   bloodpressure dropped during colonoscopy, not problems since  . Compression fracture   . COPD (chronic obstructive pulmonary disease) ([place])   . Depression   . Diastolic dysfunction   . Diverticulosis   . Dysphagia   . Emphysema lung ([place]) 11/08/2014  . Hiatal hernia   . Hypothyroidism   . IBS (irritable bowel syndrome)   . Iron deficiency anemia due to chronic blood loss 12/05/2016  . On home O2 12/15/2013   chronic hypoxia  . PUD (peptic ulcer disease)    79yrs  . Shortness of breath    with exertion  . SIADH (syndrome of inappropriate ADH production) ([place])   . Trigeminal neuralgia    Past Surgical History:  Procedure Laterality Date  . ABDOMINAL HYSTERECTOMY    . BACK SURGERY  January 13, 2014  . CATARACT EXTRACTION    . CATARACT EXTRACTION W/PHACO  09/02/2012   Procedure: CATARACT  EXTRACTION PHACO AND INTRAOCULAR LENS PLACEMENT (IOC);  Surgeon: Elta Guadeloupe T. Gershon Crane, MD;  [facility];  Service: Ophthalmology;  Laterality: Left;  CDE:10.35  . CHOLECYSTECTOMY    . 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 COLONOSCOPY N/A 11/23/2014   Dr. Oneida Alar: moderate diverticulosis, hemorrhoids, redundant colon. next TCS in 10-15 years.   Marland Kitchen ENDARTERECTOMY Right 08/21/2013   Procedure: RIGHT CAROTID ANEURYSM RESECTION;  Surgeon: Rosetta Posner, MD;  Location: [place];  Service: Vascular;  Laterality: Right;  . ESOPHAGEAL DILATION  02/24/2018   Procedure: ESOPHAGEAL DILATION;  Surgeon: Danie Binder, MD;  [facility];  Service: Endoscopy;;  . ESOPHAGOGASTRODUODENOSCOPY  11/13/2009   w/dilation to 50mm, gastric ulceration (H.Pylori) s/p treatment  . ESOPHAGOGASTRODUODENOSCOPY  11/21/2009   distal esophageal web, gastritis  . ESOPHAGOGASTRODUODENOSCOPY  03/14/12   [facility]:HQPRFFMBW in the distal esophagus/Mild gastritis/small HH. + H.pylori, prescribed Pylera. Finished treatment.   . ESOPHAGOGASTRODUODENOSCOPY N/A 12/13/2017   Procedure: ESOPHAGOGASTRODUODENOSCOPY (EGD);  Surgeon: Danie Binder, MD;  [facility];  Service: Endoscopy;  Laterality: N/A;  8:30am  . ESOPHAGOGASTRODUODENOSCOPY N/A 02/24/2018   Procedure: ESOPHAGOGASTRODUODENOSCOPY (EGD);  Surgeon: Danie Binder, MD;  [facility];  Service: Endoscopy;  Laterality: N/A;  11:00am  . fatty tumor  removal from lt groin    . FRACTURE SURGERY Left    hand and left ring finger  . GIVENS CAPSULE STUDY N/A 12/26/2017   Procedure: GIVENS CAPSULE STUDY;  Surgeon: Danie Binder, MD;  [facility];  Service: Endoscopy;  Laterality: N/A;  7:30am  . GIVENS CAPSULE STUDY N/A 02/24/2018   Procedure: GIVENS CAPSULE STUDY;  Surgeon: Danie Binder, MD;  [facility];  Service: Endoscopy;  Laterality: N/A;  . KNEE SURGERY     patella tendon repair june 2019  [facility]  . LUNG CANCER SURGERY  12/2010   Left VATS, minithoracotomy, LLL superior segmentectomy  . ORIF PATELLA Right 03/18/2018   Procedure: OPEN REDUCTION INTERNAL (ORIF) FIXATION RIGHT PATELLA;  Surgeon: Carole Civil, MD;  [facility];  Service: Orthopedics;  Laterality: Right;  . ORIF WRIST FRACTURE Left 04/09/2014   Procedure: OPEN REDUCTION INTERNAL FIXATION (ORIF) WRIST FRACTURE;  Surgeon: Renette Butters, MD;  Location: [place];  Service: Orthopedics;  Laterality: Left;  . PERCUTANEOUS PINNING Left 04/09/2014   Procedure: PERCUTANEOUS PINNING EXTREMITY;  Surgeon: Renette Butters, MD;  Location: [place];  Service: Orthopedics;  Laterality: Left;  . SAVORY DILATION N/A 12/13/2017   Procedure: SAVORY DILATION;  Surgeon: Danie Binder, MD;  [facility];  Service: Endoscopy;  Laterality: N/A;  . TUBAL LIGATION    . vocal cord surgery  02/06/2011   laryngoscopy with bilateral vocal cord Radiesse injection for vocal cord paralysis  . YAG LASER APPLICATION Left 16/07/9603   Procedure: YAG LASER APPLICATION;  Surgeon: Rutherford Guys, MD;  [facility];  Service: Ophthalmology;  Laterality: Left;   Current Outpatient Medications on File Prior to Visit  Medication Sig Dispense Refill  . acetaminophen (TYLENOL) 325 MG tablet Take 650 mg by mouth every 6 (six) hours as needed for moderate pain or headache.     . ALPRAZolam (XANAX) 0.5 MG tablet TAKE (1) TABLET BY MOUTH TWICE A DAY AS NEEDED. 60 tablet 0  . Calcium-Magnesium-Vitamin D (CITRACAL CALCIUM+D) 600-40-500 MG-MG-UNIT TB24 Take 1 tablet by mouth daily.    . citalopram (CELEXA) 40 MG tablet Take 0.5 tablets (20 mg total) by mouth at bedtime. 90 tablet 4  . denosumab (PROLIA) 60 MG/ML SOSY injection Inject 60 mg into the skin every 6 (six) months. 1 Syringe 0  . ferrous sulfate 325 (65 FE) MG tablet Take 325 mg by mouth daily with breakfast.    . ibuprofen (ADVIL,MOTRIN) 200 MG tablet Take 200 mg by mouth every 6 (six)  hours as needed for headache or mild pain.    Marland Kitchen levothyroxine (SYNTHROID, LEVOTHROID) 50 MCG tablet TAKE 1 TABLET BEFORE BREAKFAST. 90 tablet 0  . magnesium oxide (MAG-OX) 400 MG tablet Take 400 mg by mouth every evening.     . Multiple Vitamin (MULTIVITAMIN WITH MINERALS) TABS tablet Take 1 tablet by mouth daily.    Marland Kitchen omeprazole (PRILOSEC) 20 MG capsule 1 PO 30 MINS PRIOR TO BREAKFAST. (Patient taking differently: Take 20 mg by mouth daily. ) 90 capsule 3  . ondansetron (ZOFRAN ODT) 4 MG disintegrating tablet 4mg  ODT q4 hours prn nausea/vomit (Patient not taking: Reported on 11/13/2018) 6 tablet 0  . umeclidinium-vilanterol (ANORO ELLIPTA) 62.5-25 MCG/INH AEPB Inhale 1 puff into the lungs daily. 90 each 3   No current facility-administered medications on file prior to visit.    Allergies  Allergen Reactions  . Ciprofloxacin Hcl Hives  . Iohexol Other (See Comments)  Desc: Per [facility], pt is allergic to IV contrast, no type of reaction was available. Pt cannot remember but first reacted around 15 yrs ago from an IVP. Notes were date from 2009 at urology center., Onset Date: [id number]   . Prozac [Fluoxetine Hcl] Rash  . Sulfonamide Derivatives Hives and Rash   Social History   Socioeconomic History  . Marital status: Divorced    Spouse name: Not on file  . Number of children: Not on file  . Years of education: Not on file  . Highest education level: Not on file  Occupational History  . Not on file  Social Needs  . Financial resource strain: Somewhat hard  . Food insecurity:    Worry: Never true    Inability: Never true  . Transportation needs:    Medical: No    Non-medical: No  Tobacco Use  . Smoking status: Former Smoker    Packs/day: 0.50    Years: 20.00    Pack years: 10.00    Types: Cigarettes    Last attempt to quit: 12/21/2010    Years since quitting: 8.0  . Smokeless tobacco: Never Used  . Tobacco comment: smoking cessation info given and reviewed    Substance and Sexual Activity  . Alcohol use: Yes    Alcohol/week: 0.0 standard drinks    Comment: seldom  . Drug use: No  . Sexual activity: Yes    Birth control/protection: Surgical  Lifestyle  . Physical activity:    Days per week: 0 days    Minutes per session: 0 min  . Stress: Not at all  Relationships  . Social connections:    Talks on phone: Once a week    Gets together: Never    Attends religious service: Never    Active member of club or organization: No    Attends meetings of clubs or organizations: Never    Relationship status: Divorced  . Intimate partner violence:    Fear of current or ex partner: No    Emotionally abused: No    Physically abused: No    Forced sexual activity: No  Other Topics Concern  . Not on file  Social History Narrative  . Not on file      Review of Systems  All other systems reviewed and are negative.      Objective:   Physical Exam  No physical exam was performed as this was a telephone visit      Assessment & Plan:  Seasonal allergic rhinitis, unspecified trigger  Symptoms sound like seasonal allergies, allergic rhinosinusitis.  Patient is already taking Zyrtec.  She is also taking Mucinex.  I suggested that she add Flonase 2 sprays each nostril daily.  If the patient develops fever or pain in her frontal or maxillary sinuses I would then treat the patient for sinusitis with an antibiotic.  If she develops any shortness of breath or wheezing or worsening cough, consider COPD exacerbation.  Hopefully if we treat this aggressively with Flonase we can prevent this.  Phone call concluded at  2:59.

## 2018-12-30 NOTE — Telephone Encounter (Signed)
Wants prolia sent to [facility].

## 2018-12-30 NOTE — Telephone Encounter (Signed)
Medication called/sent to requested pharmacy  

## 2019-01-14 ENCOUNTER — Ambulatory Visit: Payer: PPO

## 2019-01-26 ENCOUNTER — Encounter: Payer: Self-pay | Admitting: Family Medicine

## 2019-01-27 ENCOUNTER — Encounter: Payer: Self-pay | Admitting: Family Medicine

## 2019-01-29 ENCOUNTER — Other Ambulatory Visit: Payer: Self-pay | Admitting: Family Medicine

## 2019-01-29 ENCOUNTER — Other Ambulatory Visit: Payer: Self-pay | Admitting: Gastroenterology

## 2019-01-30 NOTE — Telephone Encounter (Signed)
Pt is requesting refill on Xanax   LOV: 12/30/2018  LRF:  12/16/2018

## 2019-02-04 NOTE — Telephone Encounter (Signed)
Lmom, waiting on a return call.  

## 2019-02-04 NOTE — Telephone Encounter (Signed)
I would like to change her to Protonix, as omeprazole and celexa (doses higher than 20 mg) interact and can cause higher levels of celexa. Please ask if she is ok with this.

## 2019-02-04 NOTE — Telephone Encounter (Signed)
Pt returned call and lmom. I tried calling pt back and lmom.

## 2019-02-04 NOTE — Telephone Encounter (Signed)
Spoke with pt, she is ok with trying Protonix.

## 2019-02-05 MED ORDER — PANTOPRAZOLE SODIUM 40 MG PO TBEC: 40.0000 mg | DELAYED_RELEASE_TABLET | Freq: Every day | ORAL | 3 refills | Status: DC

## 2019-02-05 NOTE — Addendum Note (Signed)
Addended by: Annitta Needs on: 02/05/2019 01:09 PM   Modules accepted: Orders

## 2019-03-03 ENCOUNTER — Inpatient Hospital Stay (HOSPITAL_COMMUNITY): Payer: PPO

## 2019-03-03 ENCOUNTER — Inpatient Hospital Stay (HOSPITAL_COMMUNITY)
Admission: EM | Admit: 2019-03-03 | Discharge: 2019-03-12 | DRG: 054 | Disposition: A | Discharge status: Rehabilitation Facility | Payer: PPO | Attending: Neurology | Admitting: Neurology

## 2019-03-03 ENCOUNTER — Encounter (HOSPITAL_COMMUNITY): Payer: Self-pay | Admitting: Emergency Medicine

## 2019-03-03 ENCOUNTER — Emergency Department (HOSPITAL_COMMUNITY): Payer: PPO

## 2019-03-03 DIAGNOSIS — Z9851 Tubal ligation status: Secondary | ICD-10-CM

## 2019-03-03 DIAGNOSIS — I10 Essential (primary) hypertension: Secondary | ICD-10-CM | POA: Diagnosis present

## 2019-03-03 DIAGNOSIS — I469 Cardiac arrest, cause unspecified: Secondary | ICD-10-CM | POA: Diagnosis not present

## 2019-03-03 DIAGNOSIS — Z1159 Encounter for screening for other viral diseases: Secondary | ICD-10-CM | POA: Diagnosis not present

## 2019-03-03 DIAGNOSIS — R29708 NIHSS score 8: Secondary | ICD-10-CM | POA: Diagnosis present

## 2019-03-03 DIAGNOSIS — Z881 Allergy status to other antibiotic agents status: Secondary | ICD-10-CM

## 2019-03-03 DIAGNOSIS — M2550 Pain in unspecified joint: Secondary | ICD-10-CM | POA: Diagnosis not present

## 2019-03-03 DIAGNOSIS — Z79899 Other long term (current) drug therapy: Secondary | ICD-10-CM

## 2019-03-03 DIAGNOSIS — G8191 Hemiplegia, unspecified affecting right dominant side: Secondary | ICD-10-CM | POA: Diagnosis present

## 2019-03-03 DIAGNOSIS — Z85118 Personal history of other malignant neoplasm of bronchus and lung: Secondary | ICD-10-CM | POA: Diagnosis not present

## 2019-03-03 DIAGNOSIS — R4701 Aphasia: Secondary | ICD-10-CM | POA: Diagnosis not present

## 2019-03-03 DIAGNOSIS — E039 Hypothyroidism, unspecified: Secondary | ICD-10-CM | POA: Diagnosis not present

## 2019-03-03 DIAGNOSIS — I6521 Occlusion and stenosis of right carotid artery: Secondary | ICD-10-CM | POA: Diagnosis not present

## 2019-03-03 DIAGNOSIS — Z9981 Dependence on supplemental oxygen: Secondary | ICD-10-CM

## 2019-03-03 DIAGNOSIS — I618 Other nontraumatic intracerebral hemorrhage: Secondary | ICD-10-CM | POA: Diagnosis not present

## 2019-03-03 DIAGNOSIS — I611 Nontraumatic intracerebral hemorrhage in hemisphere, cortical: Secondary | ICD-10-CM | POA: Diagnosis not present

## 2019-03-03 DIAGNOSIS — Z9842 Cataract extraction status, left eye: Secondary | ICD-10-CM | POA: Diagnosis not present

## 2019-03-03 DIAGNOSIS — M6281 Muscle weakness (generalized): Secondary | ICD-10-CM | POA: Diagnosis not present

## 2019-03-03 DIAGNOSIS — Z961 Presence of intraocular lens: Secondary | ICD-10-CM | POA: Diagnosis not present

## 2019-03-03 DIAGNOSIS — Z7401 Bed confinement status: Secondary | ICD-10-CM | POA: Diagnosis not present

## 2019-03-03 DIAGNOSIS — J432 Centrilobular emphysema: Secondary | ICD-10-CM | POA: Diagnosis not present

## 2019-03-03 DIAGNOSIS — J302 Other seasonal allergic rhinitis: Secondary | ICD-10-CM | POA: Diagnosis not present

## 2019-03-03 DIAGNOSIS — R29810 Facial weakness: Secondary | ICD-10-CM | POA: Diagnosis present

## 2019-03-03 DIAGNOSIS — R000 Tachycardia, unspecified: Secondary | ICD-10-CM | POA: Diagnosis not present

## 2019-03-03 DIAGNOSIS — J449 Chronic obstructive pulmonary disease, unspecified: Secondary | ICD-10-CM | POA: Diagnosis present

## 2019-03-03 DIAGNOSIS — C7931 Secondary malignant neoplasm of brain: Principal | ICD-10-CM | POA: Diagnosis present

## 2019-03-03 DIAGNOSIS — G936 Cerebral edema: Secondary | ICD-10-CM | POA: Diagnosis not present

## 2019-03-03 DIAGNOSIS — E222 Syndrome of inappropriate secretion of antidiuretic hormone: Secondary | ICD-10-CM | POA: Diagnosis present

## 2019-03-03 DIAGNOSIS — C3490 Malignant neoplasm of unspecified part of unspecified bronchus or lung: Secondary | ICD-10-CM | POA: Diagnosis not present

## 2019-03-03 DIAGNOSIS — D649 Anemia, unspecified: Secondary | ICD-10-CM | POA: Diagnosis not present

## 2019-03-03 DIAGNOSIS — G500 Trigeminal neuralgia: Secondary | ICD-10-CM | POA: Diagnosis present

## 2019-03-03 DIAGNOSIS — F419 Anxiety disorder, unspecified: Secondary | ICD-10-CM | POA: Diagnosis present

## 2019-03-03 DIAGNOSIS — R4781 Slurred speech: Secondary | ICD-10-CM | POA: Diagnosis not present

## 2019-03-03 DIAGNOSIS — Z8249 Family history of ischemic heart disease and other diseases of the circulatory system: Secondary | ICD-10-CM

## 2019-03-03 DIAGNOSIS — Z96653 Presence of artificial knee joint, bilateral: Secondary | ICD-10-CM | POA: Diagnosis present

## 2019-03-03 DIAGNOSIS — M810 Age-related osteoporosis without current pathological fracture: Secondary | ICD-10-CM | POA: Diagnosis not present

## 2019-03-03 DIAGNOSIS — Z8711 Personal history of peptic ulcer disease: Secondary | ICD-10-CM

## 2019-03-03 DIAGNOSIS — R531 Weakness: Secondary | ICD-10-CM | POA: Diagnosis not present

## 2019-03-03 DIAGNOSIS — Z79890 Hormone replacement therapy (postmenopausal): Secondary | ICD-10-CM | POA: Diagnosis not present

## 2019-03-03 DIAGNOSIS — I619 Nontraumatic intracerebral hemorrhage, unspecified: Secondary | ICD-10-CM | POA: Diagnosis not present

## 2019-03-03 DIAGNOSIS — R41841 Cognitive communication deficit: Secondary | ICD-10-CM | POA: Diagnosis not present

## 2019-03-03 DIAGNOSIS — Z03818 Encounter for observation for suspected exposure to other biological agents ruled out: Secondary | ICD-10-CM | POA: Diagnosis not present

## 2019-03-03 DIAGNOSIS — Z411 Encounter for cosmetic surgery: Secondary | ICD-10-CM | POA: Diagnosis not present

## 2019-03-03 DIAGNOSIS — I714 Abdominal aortic aneurysm, without rupture, unspecified: Secondary | ICD-10-CM | POA: Diagnosis present

## 2019-03-03 DIAGNOSIS — Z90710 Acquired absence of both cervix and uterus: Secondary | ICD-10-CM

## 2019-03-03 DIAGNOSIS — D509 Iron deficiency anemia, unspecified: Secondary | ICD-10-CM | POA: Diagnosis not present

## 2019-03-03 DIAGNOSIS — I69120 Aphasia following nontraumatic intracerebral hemorrhage: Secondary | ICD-10-CM | POA: Diagnosis not present

## 2019-03-03 DIAGNOSIS — R5381 Other malaise: Secondary | ICD-10-CM | POA: Diagnosis not present

## 2019-03-03 DIAGNOSIS — R1310 Dysphagia, unspecified: Secondary | ICD-10-CM | POA: Diagnosis not present

## 2019-03-03 DIAGNOSIS — Z888 Allergy status to other drugs, medicaments and biological substances status: Secondary | ICD-10-CM

## 2019-03-03 DIAGNOSIS — R112 Nausea with vomiting, unspecified: Secondary | ICD-10-CM | POA: Diagnosis not present

## 2019-03-03 DIAGNOSIS — Z800 Family history of malignant neoplasm of digestive organs: Secondary | ICD-10-CM

## 2019-03-03 DIAGNOSIS — E785 Hyperlipidemia, unspecified: Secondary | ICD-10-CM | POA: Diagnosis not present

## 2019-03-03 DIAGNOSIS — Z91041 Radiographic dye allergy status: Secondary | ICD-10-CM

## 2019-03-03 DIAGNOSIS — J439 Emphysema, unspecified: Secondary | ICD-10-CM | POA: Diagnosis present

## 2019-03-03 DIAGNOSIS — I361 Nonrheumatic tricuspid (valve) insufficiency: Secondary | ICD-10-CM | POA: Diagnosis not present

## 2019-03-03 DIAGNOSIS — Z111 Encounter for screening for respiratory tuberculosis: Secondary | ICD-10-CM | POA: Diagnosis not present

## 2019-03-03 DIAGNOSIS — K5900 Constipation, unspecified: Secondary | ICD-10-CM | POA: Diagnosis not present

## 2019-03-03 DIAGNOSIS — F411 Generalized anxiety disorder: Secondary | ICD-10-CM | POA: Diagnosis not present

## 2019-03-03 DIAGNOSIS — R58 Hemorrhage, not elsewhere classified: Secondary | ICD-10-CM | POA: Diagnosis not present

## 2019-03-03 DIAGNOSIS — R52 Pain, unspecified: Secondary | ICD-10-CM | POA: Diagnosis not present

## 2019-03-03 DIAGNOSIS — R2689 Other abnormalities of gait and mobility: Secondary | ICD-10-CM | POA: Diagnosis not present

## 2019-03-03 DIAGNOSIS — I6502 Occlusion and stenosis of left vertebral artery: Secondary | ICD-10-CM | POA: Diagnosis not present

## 2019-03-03 DIAGNOSIS — G459 Transient cerebral ischemic attack, unspecified: Secondary | ICD-10-CM | POA: Diagnosis not present

## 2019-03-03 DIAGNOSIS — I69320 Aphasia following cerebral infarction: Secondary | ICD-10-CM | POA: Diagnosis not present

## 2019-03-03 DIAGNOSIS — K277 Chronic peptic ulcer, site unspecified, without hemorrhage or perforation: Secondary | ICD-10-CM | POA: Diagnosis not present

## 2019-03-03 DIAGNOSIS — R404 Transient alteration of awareness: Secondary | ICD-10-CM | POA: Diagnosis not present

## 2019-03-03 DIAGNOSIS — Z87891 Personal history of nicotine dependence: Secondary | ICD-10-CM

## 2019-03-03 DIAGNOSIS — K5730 Diverticulosis of large intestine without perforation or abscess without bleeding: Secondary | ICD-10-CM | POA: Diagnosis not present

## 2019-03-03 DIAGNOSIS — Z882 Allergy status to sulfonamides status: Secondary | ICD-10-CM

## 2019-03-03 DIAGNOSIS — Z9049 Acquired absence of other specified parts of digestive tract: Secondary | ICD-10-CM

## 2019-03-03 LAB — COMPREHENSIVE METABOLIC PANEL
ALT: 14 U/L (ref 0–44)
AST: 19 U/L (ref 15–41)
Albumin: 4.6 g/dL (ref 3.5–5.0)
Alkaline Phosphatase: 48 U/L (ref 38–126)
Anion gap: 12 (ref 5–15)
BUN: 11 mg/dL (ref 8–23)
CO2: 28 mmol/L (ref 22–32)
Calcium: 9.5 mg/dL (ref 8.9–10.3)
Chloride: 95 mmol/L — ABNORMAL LOW (ref 98–111)
Creatinine, Ser: 0.53 mg/dL (ref 0.44–1.00)
GFR calc Af Amer: >60 mL/min (ref >60)
GFR calc non Af Amer: >60 mL/min (ref >60)
Glucose, Bld: 115 mg/dL — ABNORMAL HIGH (ref 70–99)
Potassium: 3.9 mmol/L (ref 3.5–5.1)
Sodium: 135 mmol/L (ref 135–145)
Total Bilirubin: 0.6 mg/dL (ref 0.3–1.2)
Total Protein: 7.2 g/dL (ref 6.5–8.1)

## 2019-03-03 LAB — DIFFERENTIAL
Abs Immature Granulocytes: 0.03 10*3/uL (ref 0.00–0.07)
Basophils Absolute: 0.1 10*3/uL (ref 0.0–0.1)
Basophils Relative: 1 %
Eosinophils Absolute: 0 10*3/uL (ref 0.0–0.5)
Eosinophils Relative: 0 %
Immature Granulocytes: 0 %
Lymphocytes Relative: 6 %
Lymphs Abs: 0.6 10*3/uL — ABNORMAL LOW (ref 0.7–4.0)
Monocytes Absolute: 0.3 10*3/uL (ref 0.1–1.0)
Monocytes Relative: 3 %
Neutro Abs: 8.8 10*3/uL — ABNORMAL HIGH (ref 1.7–7.7)
Neutrophils Relative %: 90 %

## 2019-03-03 LAB — CK: Total CK: 120 U/L (ref 38–234)

## 2019-03-03 LAB — CBC
HCT: 39.5 % (ref 36.0–46.0)
Hemoglobin: 13 g/dL (ref 12.0–15.0)
MCH: 30.3 pg (ref 26.0–34.0)
MCHC: 32.9 g/dL (ref 30.0–36.0)
MCV: 92.1 fL (ref 80.0–100.0)
Platelets: 298 10*3/uL (ref 150–400)
RBC: 4.29 MIL/uL (ref 3.87–5.11)
RDW: 12.5 % (ref 11.5–15.5)
WBC: 9.8 10*3/uL (ref 4.0–10.5)
nRBC: 0 % (ref 0.0–0.2)

## 2019-03-03 LAB — CBG MONITORING, ED: Glucose-Capillary: 110 mg/dL — ABNORMAL HIGH (ref 70–99)

## 2019-03-03 LAB — PROTIME-INR
INR: 0.9 (ref 0.8–1.2)
Prothrombin Time: 12.5 seconds (ref 11.4–15.2)

## 2019-03-03 LAB — APTT: aPTT: 36 seconds (ref 24–36)

## 2019-03-03 LAB — SARS CORONAVIRUS 2 BY RT PCR (HOSPITAL ORDER, PERFORMED IN ~~LOC~~ HOSPITAL LAB): SARS Coronavirus 2: NEGATIVE

## 2019-03-03 LAB — ETHANOL: Alcohol, Ethyl (B): <10 mg/dL (ref <10)

## 2019-03-03 LAB — MRSA PCR SCREENING: MRSA by PCR: NEGATIVE

## 2019-03-03 MED ORDER — SENNOSIDES-DOCUSATE SODIUM 8.6-50 MG PO TABS
1.0000 | ORAL_TABLET | Freq: Two times a day (BID) | ORAL | Status: DC
  Administered 2019-03-04 – 2019-03-12 (×9): 1 via ORAL
  Filled 2019-03-03 (×15): qty 1

## 2019-03-03 MED ORDER — CHLORHEXIDINE GLUCONATE CLOTH 2 % EX PADS
6.0000 | MEDICATED_PAD | Freq: Every day | CUTANEOUS | Status: DC
  Administered 2019-03-03 – 2019-03-10 (×6): 6 via TOPICAL

## 2019-03-03 MED ORDER — ALPRAZOLAM 0.25 MG PO TABS
0.2500 mg | ORAL_TABLET | Freq: Two times a day (BID) | ORAL | Status: DC | PRN
  Administered 2019-03-05 – 2019-03-11 (×5): 0.25 mg via ORAL
  Filled 2019-03-03 (×5): qty 1

## 2019-03-03 MED ORDER — PANTOPRAZOLE SODIUM 40 MG PO TBEC: 40.0000 mg | DELAYED_RELEASE_TABLET | Freq: Every day | ORAL | Status: DC

## 2019-03-03 MED ORDER — LEVOTHYROXINE SODIUM 50 MCG PO TABS
50.0000 ug | ORAL_TABLET | Freq: Every day | ORAL | Status: DC
  Administered 2019-03-04 – 2019-03-12 (×9): 50 ug via ORAL
  Filled 2019-03-03 (×9): qty 1

## 2019-03-03 MED ORDER — ACETAMINOPHEN 160 MG/5ML PO SOLN: 650.0000 mg | ORAL | Status: DC | PRN

## 2019-03-03 MED ORDER — ALPRAZOLAM 0.5 MG PO TABS: 0.5000 mg | ORAL_TABLET | Freq: Two times a day (BID) | ORAL | Status: DC | PRN

## 2019-03-03 MED ORDER — STROKE: EARLY STAGES OF RECOVERY BOOK
Freq: Once | Status: DC
  Filled 2019-03-03: qty 1

## 2019-03-03 MED ORDER — ONDANSETRON HCL 4 MG/2ML IJ SOLN
INTRAMUSCULAR | Status: AC
  Administered 2019-03-03: 4 mg
  Filled 2019-03-03: qty 2

## 2019-03-03 MED ORDER — ORAL CARE MOUTH RINSE
15.0000 mL | Freq: Two times a day (BID) | OROMUCOSAL | Status: DC
  Administered 2019-03-04 – 2019-03-12 (×16): 15 mL via OROMUCOSAL

## 2019-03-03 MED ORDER — NICARDIPINE HCL IN NACL 20-0.86 MG/200ML-% IV SOLN: 0.0000 mg/h | INTRAVENOUS | Status: DC

## 2019-03-03 MED ORDER — METHYLPREDNISOLONE SODIUM SUCC 125 MG IJ SOLR: 125.0000 mg | Freq: Once | INTRAMUSCULAR | Status: DC

## 2019-03-03 MED ORDER — PROMETHAZINE HCL 25 MG/ML IJ SOLN
12.5000 mg | Freq: Three times a day (TID) | INTRAMUSCULAR | Status: DC | PRN
  Administered 2019-03-03: 12.5 mg via INTRAVENOUS
  Filled 2019-03-03: qty 1

## 2019-03-03 MED ORDER — SODIUM CHLORIDE 0.9 % IV SOLN
INTRAVENOUS | Status: DC
  Administered 2019-03-03: 19:00:00 via INTRAVENOUS

## 2019-03-03 MED ORDER — PANTOPRAZOLE SODIUM 40 MG IV SOLR
40.0000 mg | Freq: Every day | INTRAVENOUS | Status: DC
  Administered 2019-03-04 – 2019-03-06 (×4): 40 mg via INTRAVENOUS
  Filled 2019-03-03 (×4): qty 40

## 2019-03-03 MED ORDER — ACETAMINOPHEN 325 MG PO TABS
650.0000 mg | ORAL_TABLET | ORAL | Status: DC | PRN
  Administered 2019-03-04 – 2019-03-11 (×6): 650 mg via ORAL
  Filled 2019-03-03 (×6): qty 2

## 2019-03-03 MED ORDER — ACETAMINOPHEN 650 MG RE SUPP: 650.0000 mg | RECTAL | Status: DC | PRN

## 2019-03-03 NOTE — ED Notes (Signed)
Pt's son Tiffney Haughton [phone]. Pls contact with updates

## 2019-03-03 NOTE — ED Provider Notes (Addendum)
[facility] Emergency Department Provider Note MRN:  [id number]  Arrival date & time: 03/03/19     Chief Complaint   Cerebrovascular Accident   History of Present Illness   [name] is a 78 y.o. year-old female with a history of adenocarcinoma of the lung, aortic aneurysm, COPD presenting to the ED with chief complaint of cerebrovascular accident.  Last seen normal by neighbors at 5 PM yesterday.  Found today with right-sided deficits, issues with speaking.  I was unable to obtain an accurate HPI, PMH, or ROS due to the patient's expressive aphasia.  Review of Systems  Positive for right-sided deficits, aphasia  Patient's Health History    Past Medical History:  Diagnosis Date  . 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 Allergic rhinitis   . 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 Anxiety disorder   . Aortic aneurysm ([place])   . Complication of anesthesia 2011   bloodpressure dropped during colonoscopy, not problems since  . Compression fracture   . COPD (chronic obstructive pulmonary disease) ([place])   . Depression   . Diastolic dysfunction   . Diverticulosis   . Dysphagia   . Emphysema lung ([place]) 11/08/2014  . Hiatal hernia   . Hypothyroidism   . IBS (irritable bowel syndrome)   . Iron deficiency anemia due to chronic blood loss 12/05/2016  . On home O2 12/15/2013   chronic hypoxia  . PUD (peptic ulcer disease)    17yrs  . Shortness of breath    with exertion  . SIADH (syndrome of inappropriate ADH production) ([place])   . Trigeminal neuralgia     Past Surgical History:  Procedure Laterality Date  . ABDOMINAL HYSTERECTOMY    . BACK SURGERY  January 13, 2014  . CATARACT EXTRACTION    . CATARACT EXTRACTION W/PHACO  09/02/2012   Procedure: CATARACT EXTRACTION PHACO AND INTRAOCULAR LENS PLACEMENT (IOC);  Surgeon: Elta Guadeloupe T. Gershon Crane, MD;  [facility];  Service: Ophthalmology;  Laterality: Left;   CDE:10.35  . CHOLECYSTECTOMY    . 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 COLONOSCOPY N/A 11/23/2014   Dr. Oneida Alar: moderate diverticulosis, hemorrhoids, redundant colon. next TCS in 10-15 years.   Marland Kitchen ENDARTERECTOMY Right 08/21/2013   Procedure: RIGHT CAROTID ANEURYSM RESECTION;  Surgeon: Rosetta Posner, MD;  Location: [place];  Service: Vascular;  Laterality: Right;  . ESOPHAGEAL DILATION  02/24/2018   Procedure: ESOPHAGEAL DILATION;  Surgeon: Danie Binder, MD;  [facility];  Service: Endoscopy;;  . ESOPHAGOGASTRODUODENOSCOPY  11/13/2009   w/dilation to 58mm, gastric ulceration (H.Pylori) s/p treatment  . ESOPHAGOGASTRODUODENOSCOPY  11/21/2009   distal esophageal web, gastritis  . ESOPHAGOGASTRODUODENOSCOPY  03/14/12   [facility]:ENIDPOEUM in the distal esophagus/Mild gastritis/small HH. + H.pylori, prescribed Pylera. Finished treatment.   . ESOPHAGOGASTRODUODENOSCOPY N/A 12/13/2017   Procedure: ESOPHAGOGASTRODUODENOSCOPY (EGD);  Surgeon: Danie Binder, MD;  [facility];  Service: Endoscopy;  Laterality: N/A;  8:30am  . ESOPHAGOGASTRODUODENOSCOPY N/A 02/24/2018   Procedure: ESOPHAGOGASTRODUODENOSCOPY (EGD);  Surgeon: Danie Binder, MD;  [facility];  Service: Endoscopy;  Laterality: N/A;  11:00am  . fatty tumor removal from lt groin    . FRACTURE SURGERY Left    hand and left ring finger  . GIVENS CAPSULE STUDY N/A 12/26/2017   Procedure: GIVENS CAPSULE STUDY;  Surgeon: Danie Binder, MD;  [facility];  Service: Endoscopy;  Laterality: N/A;  7:30am  . GIVENS CAPSULE STUDY N/A 02/24/2018   Procedure: GIVENS CAPSULE STUDY;  Surgeon: Danie Binder, MD;  [facility];  Service: Endoscopy;  Laterality: N/A;  . KNEE SURGERY     patella tendon repair june 2019 [facility]  . LUNG CANCER SURGERY  12/2010   Left VATS, minithoracotomy, LLL superior segmentectomy  . ORIF PATELLA Right 03/18/2018   Procedure: OPEN  REDUCTION INTERNAL (ORIF) FIXATION RIGHT PATELLA;  Surgeon: Carole Civil, MD;  [facility];  Service: Orthopedics;  Laterality: Right;  . ORIF WRIST FRACTURE Left 04/09/2014   Procedure: OPEN REDUCTION INTERNAL FIXATION (ORIF) WRIST FRACTURE;  Surgeon: Renette Butters, MD;  Location: [place];  Service: Orthopedics;  Laterality: Left;  . PERCUTANEOUS PINNING Left 04/09/2014   Procedure: PERCUTANEOUS PINNING EXTREMITY;  Surgeon: Renette Butters, MD;  Location: [place];  Service: Orthopedics;  Laterality: Left;  . SAVORY DILATION N/A 12/13/2017   Procedure: SAVORY DILATION;  Surgeon: Danie Binder, MD;  [facility];  Service: Endoscopy;  Laterality: N/A;  . TUBAL LIGATION    . vocal cord surgery  02/06/2011   laryngoscopy with bilateral vocal cord Radiesse injection for vocal cord paralysis  . YAG LASER APPLICATION Left 63/14/9702   Procedure: YAG LASER APPLICATION;  Surgeon: Rutherford Guys, MD;  [facility];  Service: Ophthalmology;  Laterality: Left;    Family History  Problem Relation Age of Onset  . Pulmonary embolism Mother   . Heart attack Father   . Hypertension Father   . Liver cancer Sister   . Cancer Sister   . Cancer Brother   . Cancer Daughter   . Colon cancer Neg Hx     Social History   Socioeconomic History  . Marital status: Divorced    Spouse name: Not on file  . Number of children: Not on file  . Years of education: Not on file  . Highest education level: Not on file  Occupational History  . Not on file  Social Needs  . Financial resource strain: Somewhat hard  . Food insecurity:    Worry: Never true    Inability: Never true  . Transportation needs:    Medical: No    Non-medical: No  Tobacco Use  . Smoking status: Former Smoker    Packs/day: 0.50    Years: 20.00    Pack years: 10.00    Types: Cigarettes    Last attempt to quit: 12/21/2010    Years since quitting: 8.2  . Smokeless tobacco: Never Used  . Tobacco comment: smoking  cessation info given and reviewed   Substance and Sexual Activity  . Alcohol use: Yes    Alcohol/week: 0.0 standard drinks    Comment: seldom  . Drug use: No  . Sexual activity: Yes    Birth control/protection: Surgical  Lifestyle  . Physical activity:    Days per week: 0 days    Minutes per session: 0 min  . Stress: Not at all  Relationships  . Social connections:    Talks on phone: Once a week    Gets together: Never    Attends religious service: Never    Active member of club or organization: No    Attends meetings of clubs or organizations: Never    Relationship status: Divorced  . Intimate partner violence:    Fear of current or ex partner: No    Emotionally abused: No  Physically abused: No    Forced sexual activity: No  Other Topics Concern  . Not on file  Social History Narrative  . Not on file     Physical Exam  Vital Signs and Nursing Notes reviewed Vitals:   03/03/19 1330 03/03/19 1353  BP: (!) 108/91 111/74  Pulse: 80 85  Resp: 17 (!) 23  SpO2: 96% (!) 88%    CONSTITUTIONAL:  ill-appearing, NAD NEURO:  Alert and oriented x 3, moderate right arm and right leg weakness, expressive aphasia, right visual field cut EYES:  eyes equal and reactive ENT/NECK:  no LAD, no JVD CARDIO: Regular rate, well-perfused, normal S1 and S2 PULM:  CTAB no wheezing or rhonchi GI/GU:  normal bowel sounds, non-distended, non-tender MSK/SPINE:  No gross deformities, no edema SKIN:  no rash, atraumatic PSYCH:  Appropriate speech and behavior  Diagnostic and Interventional Summary    EKG Interpretation  Date/Time:  Tuesday Mar 03 2019 13:36:47 EDT Ventricular Rate:  100 PR Interval:    QRS Duration: 98 QT Interval:  350 QTC Calculation: 452 R Axis:   -17 Text Interpretation:  Sinus tachycardia Borderline left axis deviation Probable anterolateral infarct, age indeterm Confirmed by Gerlene Fee [phone]) on 03/03/2019 1:58:03 PM      Labs Reviewed  CBG MONITORING,  ED - Abnormal; Notable for the following components:      Result Value   Glucose-Capillary 110 (*)    All other components within normal limits  SARS CORONAVIRUS 2 (HOSPITAL ORDER, [place] [facility])  ETHANOL  PROTIME-INR  APTT  CBC  DIFFERENTIAL  COMPREHENSIVE METABOLIC PANEL  RAPID URINE DRUG SCREEN, HOSP PERFORMED  URINALYSIS, ROUTINE W REFLEX MICROSCOPIC  I-STAT CHEM 8, ED    CT HEAD CODE STROKE WO CONTRAST  Final Result      Medications  ondansetron (ZOFRAN) 4 MG/2ML injection (4 mg  Given 03/03/19 1338)     Procedures Critical Care Critical Care Documentation Critical care time provided by me (excluding procedures): 35 minutes  Condition necessitating critical care: Hemorrhagic stroke with mass-effect  Components of critical care management: reviewing of prior records, laboratory and imaging interpretation, frequent re-examination and reassessment of vital signs, discussion with consulting services, facilitation of transfer   ED Course and Medical Decision Making  I have reviewed the triage vital signs and the nursing notes.  Pertinent labs & imaging results that were available during my care of the patient were reviewed by me and considered in my medical decision making (see below for details).   Clinical Course as of Mar 02 1357  Tue Mar 03, 2019  1335 Patient presenting with right-sided deficits, expressive aphasia, able to follow commands, seems to have a right visual field cut, report of neighbors seen her normal at 5 PM yesterday.  Code stroke initiated, possible LVO.   [MB]  2426 Discussions with telemetry neurologist, Dr. Cheral Marker of neurology, pharmacy with regard to patient's contrast allergy, had planned on Solu-Medrol dosing prior to immediate CTA imaging, with the idea that benefits outweigh the risk.  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  Will call neurosurgery.   [MB]    Clinical Course User Index  [MB] Maudie Flakes, MD    Excepted for ED to ED transfer by Dr. Vanita Panda at [facility].  Case discussed with Dr. Cheral Marker, still awaiting callback from neurosurgery to make them aware as well.  2:23 PM spoke with provider [facility], who is aware of the patient and will  await their arrival.   Barth Kirks. Sedonia Small, [place] [email or web address]  Final Clinical Impressions(s) / ED Diagnoses     ICD-10-CM   1. Hemorrhagic stroke [facility]) I61.9     ED Discharge Orders    None         Maudie Flakes, MD 03/03/19 1400    Maudie Flakes, MD 03/03/19 [phone]

## 2019-03-03 NOTE — ED Provider Notes (Addendum)
Patient transferred from [facility], Dr. Sedonia Sanford. She presented with expressive aphasia and right-sided weakness. Last known normal at 1700 yesterday 03/02/19.   Labs unremarkable.  CT brain shows parietal-occipital parenchymal hematoma up to 3.7 cm. Likely related to underlying hypertension or underlying vasculitis.  Surrounding edema and mass-effect mostly local minimal midline shift of 3-4 mm.  Patient hemodynamically stable. On chronic home oxygen for COPD.  Dr. Cheral Sanford, [facility] neurology was consulted.  [name], neurosurgery was also consulted.  Aware of patient arrival.  Patients son at bedside and provides majority of history. Physical Exam  BP 111/74   Pulse 85   Resp (!) 23   Ht 5\' 5"  (1.651 m)   Wt 57 kg   SpO2 (!) 88%   BMI 20.91 kg/m   Physical Exam Vitals signs and nursing note reviewed.  Constitutional:      General: She is not in acute distress.    Appearance: She is well-developed. She is not toxic-appearing or diaphoretic.  HENT:     Head: Normocephalic and atraumatic.     Nose: Nose normal.     Mouth/Throat:     Mouth: Mucous membranes are dry.  Eyes:     Pupils: Pupils are equal, round, and reactive to light.  Neck:     Musculoskeletal: Normal range of motion and neck supple. No neck rigidity or muscular tenderness.  Cardiovascular:     Rate and Rhythm: Normal rate.     Pulses: Normal pulses.     Heart sounds: Normal heart sounds. No murmur. No friction rub. No gallop.   Pulmonary:     Effort: Pulmonary effort is normal. No respiratory distress.     Breath sounds: Normal breath sounds. No stridor. No wheezing, rhonchi or rales.  Abdominal:     General: Bowel sounds are normal. There is no distension.     Tenderness: There is no abdominal tenderness. There is no guarding or rebound.  Musculoskeletal: Normal range of motion.        General: No swelling or deformity.     Right lower leg: No edema.     Left lower leg: No edema.  Lymphadenopathy:      Cervical: No cervical adenopathy.  Skin:    General: Skin is warm and dry.  Neurological:     Mental Status: She is alert.     Comments: Expressive aphagia. Unable to stick tongue out or move tongue. Unable to grip to right extremity. Unable to tell me name, date or place.    ED Course/Procedures   Clinical Course as of Mar 02 1728  Tue Mar 03, 2019  1335 Patient presenting with right-sided deficits, expressive aphasia, able to follow commands, seems to have a right visual field cut, report of neighbors seen her normal at 5 PM yesterday.  Code stroke initiated, possible LVO.   [MB]  3016 Discussions with telemetry neurologist, Dr. Cheral Sanford of neurology, pharmacy with regard to patient's contrast allergy, had planned on Solu-Medrol dosing prior to immediate CTA imaging, with the idea that benefits outweigh the risk.  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  Will call neurosurgery.   [MB]    Clinical Course User Index [MB] Ruth Flakes, MD    .Critical Care Performed by: Ruth Elm, PA-C Authorized by: Ruth Elm, PA-C   Critical care provider statement:    Critical care time (minutes):  35   Critical care was necessary to treat or prevent imminent or life-threatening deterioration of  the following conditions:  CNS failure or compromise   Critical care was time spent personally by me on the following activities:  Re-evaluation of patient's condition, review of old charts, pulse oximetry, ordering and review of radiographic studies, ordering and review of laboratory studies, ordering and performing treatments and interventions, development of treatment plan with patient or surrogate, discussions with consultants, discussions with primary provider, evaluation of patient's response to treatment, examination of patient and obtaining history from patient or surrogate   Labs Reviewed  DIFFERENTIAL - Abnormal; Notable for the following  components:      Result Value   Neutro Abs 8.8 (*)    Lymphs Abs 0.6 (*)    All other components within normal limits  COMPREHENSIVE METABOLIC PANEL - Abnormal; Notable for the following components:   Chloride 95 (*)    Glucose, Bld 115 (*)    All other components within normal limits  CBG MONITORING, ED - Abnormal; Notable for the following components:   Glucose-Capillary 110 (*)    All other components within normal limits  SARS CORONAVIRUS 2 (HOSPITAL ORDER, [place] [facility])  ETHANOL  PROTIME-INR  APTT  CBC  CK  RAPID URINE DRUG SCREEN, HOSP PERFORMED  URINALYSIS, ROUTINE W REFLEX MICROSCOPIC  I-STAT CHEM 8, ED  Ct Head Code Stroke Wo Contrast  Result Date: 03/03/2019 CLINICAL DATA:  Code stroke. Acute onset of aphasia and right-sided weakness. EXAM: CT HEAD WITHOUT CONTRAST TECHNIQUE: Contiguous axial images were obtained from the base of the skull through the vertex without intravenous contrast. COMPARISON:  CT head without contrast 09/11/2016 FINDINGS: Brain: An acute left parietal and occipital hemorrhage measures 3.5 x 3.7 x 3.0 cm. There is surrounding edema with effacement of the adjacent sulci and minimal mass effect on the posterior horn of the left lateral ventricle. 3 mm midline shift is present. No other focal hemorrhage is present. No other mass lesion or acute infarct is evident. Basal ganglia are intact. Insular ribbon is normal. No significant extra-axial fluid collection is present. Vascular: Minimal calcifications are present. There is no hyperdense vessel. Skull: Calvarium is intact. No focal lytic or blastic lesions are present. Sinuses/Orbits: The paranasal sinuses and mastoid air cells are clear. Bilateral lens replacements are noted. Globes and orbits are otherwise unremarkable. IMPRESSION: 1. Left parietal and occipital parenchymal hematoma measuring up to 3.7 cm. This is nonspecific. It is most likely related to hypertension or underlying  vasculitis in a patient of this age. The differential diagnosis includes metastatic disease or other vascular malformation. No other lesions are evident. 2. Surrounding edema and mass effect is mostly local with minimal midline shift of 3-4 mm. 3. No other focal lesions or hemorrhage. Critical Value/emergent results were called by telephone at the time of interpretation on 03/03/2019 at 1:39 pm to Dr. Gerlene Fee , who verbally acknowledged these results. Electronically Signed   By: San Morelle M.D.   On: 03/03/2019 13:46   MDM  Care transferred from [facility] with right-sided weakness and aphasia. Last known normal 5 PM yesterday evening.  Patient with parietal-occipital parenchymal hematoma with mild midline shift.  Neurosurgery were both consulted by previous provider at [facility].  Plan for evaluation by both services on arrival here.  Labs and imagine personally reviewed. Previous notes reviewed. Nursing notes reviewed.  On arrival patient with expressive aphasia and right-sided weakness.  She is unable to handgrip to right hand.  She is unable to stick out her tongue.  She  is able to shrug bilateral shoulders, however is weaker on right.  Patient keeps repeating pain and nauseous multiple times.  She is unable to tell me where she is or what year it is.  She is unable to answer any history questions.  Her son is at bedside who provides majority of history. Heart and lungs clear.  Neurosurgery, Mariana Single, NP has been consulted.  Dr. Cheral Sanford, neurology has been consulted. Hemodynamically stable at this time.  1530: On reevaluation patient continues to be hemodynamically stable.  He is on her chronic home oxygen.  Continued expressive aphasia with right-sided weakness.  Son at bedside has been updated on plan.   1545: Minerva Ends with neurosurgery has evaluated patient.  Patient does not require neurosurgical intervention at this time.  1640: Etta Quill, PA  with neurology has evaluated patient.  Plan for admission from neurology service.  MRI pending.    Admission orders from neurology service.  Appreciate consultation.  Patient continues to be hemodynamically stable.   Tarina Volk A, PA-C 03/03/19 1654    Domingo Fuson A, PA-C 03/03/19 1729    Virgel Manifold, MD 03/04/19 1215

## 2019-03-03 NOTE — ED Notes (Signed)
ED TO INPATIENT HANDOFF REPORT  ED Nurse Name and Phone #: Garnette Czech [phone]  S Name/Age/Gender [name] 78 y.o. female Room/Bed: 015C/015C  Code Status   Code Status: Full Code  Home/SNF/Other Home Patient oriented to: self Is this baseline? No   Triage Complete: Triage complete  Chief Complaint cva  Triage Note LSN 1700 by neighbors yesterday. Pt having trouble speaking and right side weakness.   Pt arrived via [facility] from [place]. Pt presents with expressive aphasia and and right sided weakness. See NIHSS for complete assessment.    Allergies Allergies  Allergen Reactions  . Ciprofloxacin Hcl Hives  . Iohexol Other (See Comments)     Desc: Per [facility], pt is allergic to IV contrast, no type of reaction was available. Pt cannot remember but first reacted around 15 yrs ago from an IVP. Notes were date from 2009 at urology center., [place] Date: [id number]   . Prozac [Fluoxetine Hcl] Rash  . Sulfonamide Derivatives Hives and Rash    Level of Care/Admitting Diagnosis ED Disposition    ED Disposition Condition [facility]: [place] [[id number]]  Level of Care: ICU [6]  Covid Evaluation: N/A  Diagnosis: ICH (intracerebral hemorrhage) [facility]) [[id number]]  Admitting Physician: Cheral Marker [place]  Attending Physician: Cheral Marker, ERIC Amey.Fanny  Estimated length of stay: 5 - 7 days  Certification:: I certify there are rare and unusual circumstances requiring inpatient admission  PT Class (Do Not Modify): Inpatient [101]  PT Acc Code (Do Not Modify): Private [1]       B Medical/Surgery History Past Medical History:  Diagnosis Date  . 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 Allergic rhinitis   . 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 Anxiety disorder   . Aortic aneurysm ([place])   . Complication of anesthesia 2011   bloodpressure dropped during colonoscopy, not  problems since  . Compression fracture   . COPD (chronic obstructive pulmonary disease) ([place])   . Depression   . Diastolic dysfunction   . Diverticulosis   . Dysphagia   . Emphysema lung ([place]) 11/08/2014  . Hiatal hernia   . Hypothyroidism   . IBS (irritable bowel syndrome)   . Iron deficiency anemia due to chronic blood loss 12/05/2016  . On home O2 12/15/2013   chronic hypoxia  . PUD (peptic ulcer disease)    59yrs  . Shortness of breath    with exertion  . SIADH (syndrome of inappropriate ADH production) ([place])   . Trigeminal neuralgia    Past Surgical History:  Procedure Laterality Date  . ABDOMINAL HYSTERECTOMY    . BACK SURGERY  January 13, 2014  . CATARACT EXTRACTION    . CATARACT EXTRACTION W/PHACO  09/02/2012   Procedure: CATARACT EXTRACTION PHACO AND INTRAOCULAR LENS PLACEMENT (IOC);  Surgeon: Elta Guadeloupe T. Gershon Crane, MD;  [facility]: [facility];  Service: Ophthalmology;  Laterality: Left;  CDE:10.35  . CHOLECYSTECTOMY    . 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 COLONOSCOPY N/A 11/23/2014   Dr. Oneida Alar: moderate diverticulosis, hemorrhoids, redundant colon. next TCS in 10-15 years.   Marland Kitchen ENDARTERECTOMY Right 08/21/2013   Procedure: RIGHT CAROTID ANEURYSM RESECTION;  Surgeon: Rosetta Posner, MD;  Location: [place];  Service: Vascular;  Laterality: Right;  . ESOPHAGEAL DILATION  02/24/2018   Procedure: ESOPHAGEAL DILATION;  Surgeon: Danie Binder, MD;  [facility];  Service: Endoscopy;;  . ESOPHAGOGASTRODUODENOSCOPY  11/13/2009   w/dilation to 58mm, gastric ulceration (H.Pylori) s/p treatment  . ESOPHAGOGASTRODUODENOSCOPY  11/21/2009   distal esophageal web, gastritis  . ESOPHAGOGASTRODUODENOSCOPY  03/14/12   WER:[id number] in the distal esophagus/Mild gastritis/small HH. + H.pylori, prescribed Pylera. Finished treatment.   . ESOPHAGOGASTRODUODENOSCOPY N/A 12/13/2017   Procedure: ESOPHAGOGASTRODUODENOSCOPY (EGD);  Surgeon: Danie Binder, MD;   [facility];  Service: Endoscopy;  Laterality: N/A;  8:30am  . ESOPHAGOGASTRODUODENOSCOPY N/A 02/24/2018   Procedure: ESOPHAGOGASTRODUODENOSCOPY (EGD);  Surgeon: Danie Binder, MD;  [facility];  Service: Endoscopy;  Laterality: N/A;  11:00am  . fatty tumor removal from lt groin    . FRACTURE SURGERY Left    hand and left ring finger  . GIVENS CAPSULE STUDY N/A 12/26/2017   Procedure: GIVENS CAPSULE STUDY;  Surgeon: Danie Binder, MD;  [facility];  Service: Endoscopy;  Laterality: N/A;  7:30am  . GIVENS CAPSULE STUDY N/A 02/24/2018   Procedure: GIVENS CAPSULE STUDY;  Surgeon: Danie Binder, MD;  [facility];  Service: Endoscopy;  Laterality: N/A;  . KNEE SURGERY     patella tendon repair june 2019 [place]  . LUNG CANCER SURGERY  12/2010   Left VATS, minithoracotomy, LLL superior segmentectomy  . ORIF PATELLA Right 03/18/2018   Procedure: OPEN REDUCTION INTERNAL (ORIF) FIXATION RIGHT PATELLA;  Surgeon: Carole Civil, MD;  [facility];  Service: Orthopedics;  Laterality: Right;  . ORIF WRIST FRACTURE Left 04/09/2014   Procedure: OPEN REDUCTION INTERNAL FIXATION (ORIF) WRIST FRACTURE;  Surgeon: Renette Butters, MD;  Location: [place];  Service: Orthopedics;  Laterality: Left;  . PERCUTANEOUS PINNING Left 04/09/2014   Procedure: PERCUTANEOUS PINNING EXTREMITY;  Surgeon: Renette Butters, MD;  Location: [place];  Service: Orthopedics;  Laterality: Left;  . SAVORY DILATION N/A 12/13/2017   Procedure: SAVORY DILATION;  Surgeon: Danie Binder, MD;  [facility];  Service: Endoscopy;  Laterality: N/A;  . TUBAL LIGATION    . vocal cord surgery  02/06/2011   laryngoscopy with bilateral vocal cord Radiesse injection for vocal cord paralysis  . YAG LASER APPLICATION Left 19/50/9326   Procedure: YAG LASER APPLICATION;  Surgeon: Rutherford Guys, MD;  [facility];  Service: Ophthalmology;  Laterality: Left;     A IV  Location/Drains/Wounds Patient Lines/Drains/Airways Status   Active Line/Drains/Airways    Name:   Placement date:   Placement time:   Site:   Days:   Peripheral IV 03/03/19 Left Hand   03/03/19    1343    Hand   less than 1   Peripheral IV 03/03/19 Left Forearm   03/03/19    1352    Forearm   less than 1   External Urinary Catheter   03/22/18    -    -   346   Incision (Closed) 03/18/18 Knee Right   03/18/18    1427     350          Intake/Output Last 24 hours No intake or output data in the 24 hours ending 03/03/19 1728  Labs/Imaging Results for orders placed or performed during the hospital encounter of 03/03/19 (from the past 48 hour(s))  CBG monitoring, ED     Status: Abnormal   Collection Time: 03/03/19  1:40 PM  Result Value Ref Range   Glucose-Capillary 110 (H) 70 - 99 mg/dL  SARS Coronavirus 2 (CEPHEID -  Performed in [facility] hospital lab), Hosp Order     Status: None   Collection Time: 03/03/19  1:43 PM  Result Value Ref Range   SARS Coronavirus 2 NEGATIVE NEGATIVE    Comment: (NOTE) If result is NEGATIVE SARS-CoV-2 target nucleic acids are NOT DETECTED. The SARS-CoV-2 RNA is generally detectable in upper and lower  respiratory specimens during the acute phase of infection. The lowest  concentration of SARS-CoV-2 viral copies this assay can detect is 250  copies / mL. A negative result does not preclude SARS-CoV-2 infection  and should not be used as the sole basis for treatment or other  patient management decisions.  A negative result may occur with  improper specimen collection / handling, submission of specimen other  than nasopharyngeal swab, presence of viral mutation(s) within the  areas targeted by this assay, and inadequate number of viral copies  (<250 copies / mL). A negative result must be combined with clinical  observations, patient history, and epidemiological information. If result is POSITIVE SARS-CoV-2 target nucleic acids are DETECTED. The  SARS-CoV-2 RNA is generally detectable in upper and lower  respiratory specimens dur ing the acute phase of infection.  Positive  results are indicative of active infection with SARS-CoV-2.  Clinical  correlation with patient history and other diagnostic information is  necessary to determine patient infection status.  Positive results do  not rule out bacterial infection or co-infection with other viruses. If result is PRESUMPTIVE POSTIVE SARS-CoV-2 nucleic acids MAY BE PRESENT.   A presumptive positive result was obtained on the submitted specimen  and confirmed on repeat testing.  While 2019 novel coronavirus  (SARS-CoV-2) nucleic acids may be present in the submitted sample  additional confirmatory testing may be necessary for epidemiological  and / or clinical management purposes  to differentiate between  SARS-CoV-2 and other Sarbecovirus currently known to infect humans.  If clinically indicated additional testing with an alternate test  methodology [phone]) is advised. The SARS-CoV-2 RNA is generally  detectable in upper and lower respiratory sp ecimens during the acute  phase of infection. The expected result is Negative. Fact Sheet for Patients:  StrictlyIdeas.no Fact Sheet for Healthcare Providers: BankingDealers.co.za This test is not yet approved or cleared by the [place] FDA and has been authorized for detection and/or diagnosis of SARS-CoV-2 by FDA under an Emergency Use Authorization (EUA).  This EUA will remain in effect (meaning this test can be used) for the duration of the COVID-19 declaration under Section 564(b)(1) of the Act, 21 U.S.C. section 360bbb-3(b)(1), unless the authorization is terminated or revoked sooner. Performed at [facility], [address], [place], [place] [address]   Ethanol     Status: None   Collection Time: 03/03/19  1:52 PM  Result Value Ref Range   Alcohol, Ethyl (B) <10 <10 mg/dL     Comment: (NOTE) Lowest detectable limit for serum alcohol is 10 mg/dL. For medical purposes only. Performed at [facility], [address], [place], [place] [address]   Protime-INR     Status: None   Collection Time: 03/03/19  1:52 PM  Result Value Ref Range   Prothrombin Time 12.5 11.4 - 15.2 seconds   INR 0.9 0.8 - 1.2    Comment: (NOTE) INR goal varies based on device and disease states. Performed at [facility], [address], [place], [place] [address]   APTT     Status: None   Collection Time: 03/03/19  1:52 PM  Result Value Ref  Range   aPTT 36 24 - 36 seconds    Comment: Performed at [facility], [address] [place], [place] [address]  CBC     Status: None   Collection Time: 03/03/19  1:52 PM  Result Value Ref Range   WBC 9.8 4.0 - 10.5 K/uL   RBC 4.29 3.87 - 5.11 MIL/uL   Hemoglobin 13.0 12.0 - 15.0 g/dL   HCT 39.5 36.0 - 46.0 %   MCV 92.1 80.0 - 100.0 fL   MCH 30.3 26.0 - 34.0 pg   MCHC 32.9 30.0 - 36.0 g/dL   RDW 12.5 11.5 - 15.5 %   Platelets 298 150 - 400 K/uL   nRBC 0.0 0.0 - 0.2 %    Comment: Performed at [facility], [address], [place], [place] [address]  Differential     Status: Abnormal   Collection Time: 03/03/19  1:52 PM  Result Value Ref Range   Neutrophils Relative % 90 %   Neutro Abs 8.8 (H) 1.7 - 7.7 K/uL   Lymphocytes Relative 6 %   Lymphs Abs 0.6 (L) 0.7 - 4.0 K/uL   Monocytes Relative 3 %   Monocytes Absolute 0.3 0.1 - 1.0 K/uL   Eosinophils Relative 0 %   Eosinophils Absolute 0.0 0.0 - 0.5 K/uL   Basophils Relative 1 %   Basophils Absolute 0.1 0.0 - 0.1 K/uL   Immature Granulocytes 0 %   Abs Immature Granulocytes 0.03 0.00 - 0.07 K/uL    Comment: Performed at [facility], [address], [place], [place] [address]  Comprehensive metabolic panel     Status: Abnormal   Collection Time: 03/03/19  1:52 PM  Result Value Ref Range   Sodium 135 135 - 145 mmol/L   Potassium 3.9 3.5 - 5.1 mmol/L   Chloride 95 (L) 98 - 111 mmol/L    CO2 28 22 - 32 mmol/L   Glucose, Bld 115 (H) 70 - 99 mg/dL   BUN 11 8 - 23 mg/dL   Creatinine, Ser 0.53 0.44 - 1.00 mg/dL   Calcium 9.5 8.9 - 10.3 mg/dL   Total Protein 7.2 6.5 - 8.1 g/dL   Albumin 4.6 3.5 - 5.0 g/dL   AST 19 15 - 41 U/L   ALT 14 0 - 44 U/L   Alkaline Phosphatase 48 38 - 126 U/L   Total Bilirubin 0.6 0.3 - 1.2 mg/dL   GFR calc non Af Amer >60 >60 mL/min   GFR calc Af Amer >60 >60 mL/min   Anion gap 12 5 - 15    Comment: Performed at [facility], [address], [place], [place] [address]  CK     Status: None   Collection Time: 03/03/19  1:52 PM  Result Value Ref Range   Total CK 120 38 - 234 U/L    Comment: Performed at [facility], [address] [place], [place] [address]   Ct Head Code Stroke Wo Contrast  Result Date: 03/03/2019 CLINICAL DATA:  Code stroke. Acute onset of aphasia and right-sided weakness. EXAM: CT HEAD WITHOUT CONTRAST TECHNIQUE: Contiguous axial images were obtained from the base of the skull through the vertex without intravenous contrast. COMPARISON:  CT head without contrast 09/11/2016 FINDINGS: Brain: An acute left parietal and occipital hemorrhage measures 3.5 x 3.7 x 3.0 cm. There is surrounding edema with effacement of the adjacent sulci and minimal mass effect on the posterior horn of the left lateral ventricle. 3 mm midline shift is present. No  other focal hemorrhage is present. No other mass lesion or acute infarct is evident. Basal ganglia are intact. Insular ribbon is normal. No significant extra-axial fluid collection is present. Vascular: Minimal calcifications are present. There is no hyperdense vessel. Skull: Calvarium is intact. No focal lytic or blastic lesions are present. Sinuses/Orbits: The paranasal sinuses and mastoid air cells are clear. Bilateral lens replacements are noted. Globes and orbits are otherwise unremarkable. IMPRESSION: 1. Left parietal and occipital parenchymal hematoma measuring up to 3.7 cm. This is nonspecific. It  is most likely related to hypertension or underlying vasculitis in a patient of this age. The differential diagnosis includes metastatic disease or other vascular malformation. No other lesions are evident. 2. Surrounding edema and mass effect is mostly local with minimal midline shift of 3-4 mm. 3. No other focal lesions or hemorrhage. Critical Value/emergent results were called by telephone at the time of interpretation on 03/03/2019 at 1:39 pm to Dr. Gerlene Fee , who verbally acknowledged these results. Electronically Signed   By: San Morelle M.D.   On: 03/03/2019 13:46    Pending Labs Unresulted Labs (From admission, onward)    Start     Ordered   03/03/19 1321  Urine rapid drug screen (hosp performed)  ONCE - STAT,   STAT     03/03/19 1320   03/03/19 1321  Urinalysis, Routine w reflex microscopic  ONCE - STAT,   STAT     03/03/19 1320          Vitals/Pain Today's Vitals   03/03/19 1344 03/03/19 1345 03/03/19 1353 03/03/19 1502  BP:   111/74   Pulse:   85   Resp:   (!) 23   SpO2:   (!) 88%   Weight:  57 kg    Height:  5\' 5"  (1.651 m)    PainSc: 0-No pain   0-No pain    Isolation Precautions No active isolations  Medications Medications  levothyroxine (SYNTHROID) tablet 50 mcg (has no administration in time range)   stroke: mapping our early stages of recovery book (has no administration in time range)  acetaminophen (TYLENOL) tablet 650 mg (has no administration in time range)    Or  acetaminophen (TYLENOL) solution 650 mg (has no administration in time range)    Or  acetaminophen (TYLENOL) suppository 650 mg (has no administration in time range)  senna-docusate (Senokot-S) tablet 1 tablet (has no administration in time range)  pantoprazole (PROTONIX) injection 40 mg (has no administration in time range)  0.9 %  sodium chloride infusion (has no administration in time range)  nicardipine (CARDENE) 20mg  in 0.86% saline 260ml IV infusion (0.1 mg/ml) (has no  administration in time range)  ondansetron (ZOFRAN) 4 MG/2ML injection (4 mg  Given 03/03/19 1338)    Mobility walks High fall risk   Focused Assessments Neuro Assessment Handoff:  Swallow screen pass? Yes    NIH Stroke Scale ( + Modified Stroke Scale Criteria)  Interval: Initial Level of Consciousness (1a.)   : Alert, keenly responsive LOC Questions (1b. )   +: Answers neither question correctly LOC Commands (1c. )   + : Performs both tasks correctly Best Gaze (2. )  +: Normal Visual (3. )  +: Complete hemianopia Facial Palsy (4. )    : Normal symmetrical movements Motor Arm, Left (5a. )   +: No drift Motor Arm, Right (5b. )   +: Drift Motor Leg, Left (6a. )   +: No drift Motor Leg, Right (6b. )   +:  No drift Limb Ataxia (7. ): Present in one limb Sensory (8. )   +: Mild-to-moderate sensory loss, patient feels pinprick is less sharp or is dull on the affected side, or there is a loss of superficial pain with pinprick, but patient is aware of being touched Best Language (9. )   +: Severe aphasia Dysarthria (10. ): Mild-to-moderate dysarthria, patient slurs at least some words and, at worst, can be understood with some difficulty Extinction/Inattention (11.)   +: Visual/tactile/auditory/spatial/personal inattention Modified SS Total  +: 9 Complete NIHSS TOTAL: 11     Neuro Assessment: Exceptions to WDL Neuro Checks:   Initial (03/03/19 1315)  Last Documented NIHSS Modified Score: 9 (03/03/19 1444) Has TPA been given? No If patient is a Neuro Trauma and patient is going to OR before floor call report to [place] nurse: [phone] or [phone]     R Recommendations: See Admitting Provider Note  Report given to:   Additional Notes:   [name] is a 78 y.o. female with history of trigeminal neuralgia, on home O2, IBS, dysphagia, diastolic dysfunction, COPD, aortic aneurysm and anxiety.  Per notes patient presented to any [facility] ED as a code stroke.  Patient was a  phasic and unable to provide history at that time.  Son was at the bedside and assisted with a history.  By report, neighbor who checks on the patient found her slightly confused yesterday around 1700 hrs.  Today the neighbor went back over, she was unable to communicate and was noted to have right-sided weakness and subsequently EMS was called.  Son also noted that the patient had been complaining of headaches since Thursday.  Patient underwent a CT while in the ED and was found to have a left parietal occipital hemorrhage.  Neurosurgery was consulted however there was no indication for neurosurgical intervention.  Patient thus was transferred to [facility] hospital for further stroke care.  Currently on consultation patient has difficulty following commands, has paraphasic errors and is unable to give any history.

## 2019-03-03 NOTE — H&P (Addendum)
Neurology H&P    History is obtained from: Notes as patient has both receptive and expressive aphasia  HPI: [name] is a 78 y.o. female with history of trigeminal neuralgia, on home O2, IBS, dysphagia, diastolic dysfunction, COPD, aortic aneurysm and anxiety.  Per notes patient presented to the [facility] ED as a code stroke.  Patient was aphasic and unable to provide history at that time.  Son was at the bedside and assisted with a history.  By report, neighbor who checks on the patient found her slightly confused yesterday around 1700 hrs.  Today when the neighbor went back over, the patient was unable to communicate and was noted to have right-sided weakness and subsequently EMS was called.  Son also noted that the patient had been complaining of headaches since Thursday. Patient underwent a CT while in the ED and was found to have a left parietal-occipital hemorrhage.  Neurosurgery was consulted and determined that there was no indication for neurosurgical intervention.  Patient was then transferred to [facility] for further care.  During the Neurology assessment, the patient has continued difficulty following commands. She has nonsensical, fluent speech and is unable to give any history.   LKW: 1700 hrs. on 03/02/2019 tpa given?: no, intracerebral hemorrhage Premorbid modified Rankin scale (mRS): 0 NIH stroke score of 8 ICH Score: 0   ROS:  Unable to obtain due to aphasia  Past Medical History:  Diagnosis Date  . 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 Allergic rhinitis   . 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)    corrected by surgery 08/21/13  . Anxiety disorder   . Aortic aneurysm ([place])   . Complication of anesthesia 2011   bloodpressure dropped during colonoscopy, not problems since  . Compression fracture   . COPD (chronic obstructive pulmonary disease) ([place])   . Depression   . Diastolic dysfunction   . Diverticulosis   . Dysphagia   .  Emphysema lung ([place]) 11/08/2014  . Hiatal hernia   . Hypothyroidism   . IBS (irritable bowel syndrome)   . Iron deficiency anemia due to chronic blood loss 12/05/2016  . On home O2 12/15/2013   chronic hypoxia  . PUD (peptic ulcer disease)    57yrs  . Shortness of breath    with exertion  . SIADH (syndrome of inappropriate ADH production) ([place])   . Trigeminal neuralgia     Family History  Problem Relation Age of Onset  . Pulmonary embolism Mother   . Heart attack Father   . Hypertension Father   . Liver cancer Sister   . Cancer Sister   . Cancer Brother   . Cancer Daughter   . Colon cancer Neg Hx      Social History:   reports that she quit smoking about 8 years ago. Her smoking use included cigarettes. She has a 10.00 pack-year smoking history. She has never used smokeless tobacco. She reports current alcohol use. She reports that she does not use drugs.  Medications No current facility-administered medications for this encounter.   Current Outpatient Medications:  .  acetaminophen (TYLENOL) 325 MG tablet, Take 650 mg by mouth every 6 (six) hours as needed for moderate pain or headache. , Disp: , Rfl:  .  ALPRAZolam (XANAX) 0.5 MG tablet, TAKE (1) TABLET BY MOUTH TWICE A DAY AS NEEDED., Disp: 60 tablet, Rfl: 2 .  Calcium-Magnesium-Vitamin D (CITRACAL CALCIUM+D) 600-40-500 MG-MG-UNIT TB24, Take 1 tablet by mouth daily.,  Disp: , Rfl:  .  citalopram (CELEXA) 40 MG tablet, TAKE (1/2) TABLET BY MOUTH AT BEDTIME., Disp: 45 tablet, Rfl: 3 .  denosumab (PROLIA) 60 MG/ML SOSY injection, Inject 60 mg into the skin every 6 (six) months., Disp: 1 Syringe, Rfl: 1 .  ferrous sulfate 325 (65 FE) MG tablet, Take 325 mg by mouth daily with breakfast., Disp: , Rfl:  .  fluticasone (FLONASE) 50 MCG/ACT nasal spray, Place 2 sprays into both nostrils daily., Disp: 16 g, Rfl: 6 .  ibuprofen (ADVIL,MOTRIN) 200 MG tablet, Take 200 mg by mouth every 6 (six) hours as needed for headache or mild pain.,  Disp: , Rfl:  .  levothyroxine (SYNTHROID, LEVOTHROID) 50 MCG tablet, TAKE 1 TABLET BEFORE BREAKFAST., Disp: 90 tablet, Rfl: 0 .  magnesium oxide (MAG-OX) 400 MG tablet, Take 400 mg by mouth every evening. , Disp: , Rfl:  .  Multiple Vitamin (MULTIVITAMIN WITH MINERALS) TABS tablet, Take 1 tablet by mouth daily., Disp: , Rfl:  .  omeprazole (PRILOSEC) 20 MG capsule, 1 PO 30 MINS PRIOR TO BREAKFAST. (Patient taking differently: Take 20 mg by mouth daily. ), Disp: 90 capsule, Rfl: 3 .  ondansetron (ZOFRAN ODT) 4 MG disintegrating tablet, 4mg  ODT q4 hours prn nausea/vomit (Patient not taking: Reported on 11/13/2018), Disp: 6 tablet, Rfl: 0 .  pantoprazole (PROTONIX) 40 MG tablet, Take 1 tablet (40 mg total) by mouth daily before breakfast., Disp: 90 tablet, Rfl: 3 .  umeclidinium-vilanterol (ANORO ELLIPTA) 62.5-25 MCG/INH AEPB, Inhale 1 puff into the lungs daily., Disp: 90 each, Rfl: 3   Exam: Current vital signs: BP 111/74   Pulse 85   Resp (!) 23   Ht 5\' 5"  (1.651 m)   Wt 57 kg   SpO2 (!) 88%   BMI 20.91 kg/m  Vital signs in last 24 hours: Pulse Rate:  [80-85] 85 (05/26 1353) Resp:  [17-23] 23 (05/26 1353) BP: (108-111)/(74-91) 111/74 (05/26 1353) SpO2:  [88 %-96 %] 88 % (05/26 1353) Weight:  [57 kg] 57 kg (05/26 1345)  Physical Exam  Constitutional: Appears well-developed and well-nourished.  Psych: A phasic Eyes: No scleral injection HENT: No OP obstrucion Head: Normocephalic.  Cardiovascular: Normal rate and regular rhythm.  Respiratory: Effort normal, non-labored breathing GI: Soft.  No distension. There is no tenderness.  Skin: WDI  Neuro: Mental Status: Patient is awake. Has dense receptive aphasia.  Cannot give a history.  Shows no signs of neglect and no dysarthria. Cranial Nerves: II: Given her aphasia this is difficult however findings are suggestive of a have a right superior quadrantanopsia III,IV, VI: EOMI without ptosis or diplopia. Pupils equal, round and  reactive to light V: Facial sensation is symmetric VII: Facial movement is symmetric.  VIII: hearing is intact to voice X: Palate elevates symmetrically XI: Shoulder shrug is symmetric. XII: tongue is midline without atrophy or fasciculations.  Motor:  moving all extremities with 5/5 strength-does seem to express that her right arm has improved Sensory: Withdraws from noxious stimuli in all 4 extremities Deep Tendon Reflexes: 2+ and symmetric in the biceps however no deep tendon reflexes at her patellae secondary to bilateral knee replacements Plantars: Toes are downgoing bilaterally.  Cerebellar: FNF and HKS are intact bilaterally  Labs I have reviewed labs in epic and the results pertinent to this consultation are:   CBC    Component Value Date/Time   WBC 9.8 03/03/2019 1352   RBC 4.29 03/03/2019 1352   HGB 13.0 03/03/2019 1352  HCT 39.5 03/03/2019 1352   PLT 298 03/03/2019 1352   MCV 92.1 03/03/2019 1352   MCH 30.3 03/03/2019 1352   MCHC 32.9 03/03/2019 1352   RDW 12.5 03/03/2019 1352   LYMPHSABS 0.6 (L) 03/03/2019 1352   MONOABS 0.3 03/03/2019 1352   EOSABS 0.0 03/03/2019 1352   BASOSABS 0.1 03/03/2019 1352    CMP     Component Value Date/Time   NA 135 03/03/2019 1352   K 3.9 03/03/2019 1352   CL 95 (L) 03/03/2019 1352   CO2 28 03/03/2019 1352   GLUCOSE 115 (H) 03/03/2019 1352   BUN 11 03/03/2019 1352   CREATININE 0.53 03/03/2019 1352   CREATININE 0.40 (L) 11/01/2016 1056   CALCIUM 9.5 03/03/2019 1352   PROT 7.2 03/03/2019 1352   ALBUMIN 4.6 03/03/2019 1352   AST 19 03/03/2019 1352   ALT 14 03/03/2019 1352   ALKPHOS 48 03/03/2019 1352   BILITOT 0.6 03/03/2019 1352   GFRNONAA >60 03/03/2019 1352   GFRNONAA >89 11/01/2016 1056   GFRAA >60 03/03/2019 1352   GFRAA >89 11/01/2016 1056    Lipid Panel     Component Value Date/Time   CHOL 217 (H) 10/25/2014 1127   TRIG 86 10/25/2014 1127   HDL 73 10/25/2014 1127   CHOLHDL 3.0 10/25/2014 1127   VLDL  17 10/25/2014 1127   LDLCALC 127 (H) 10/25/2014 1127     Imaging I have reviewed the images obtained:  CT-scan of the brain:  1. Left parietal and occipital parenchymal hematoma measuring up to 3.7 cm. This is nonspecific. It is most likely related to hypertension or underlying vasculitis in a patient of this age. The differential diagnosis also includes metastatic disease or vascular malformation. No other lesions are evident. 2. Surrounding edema and mass effect is mostly local with minimal midline shift of 3-4 mm. 3. No other focal lesions or hemorrhage.  MRI examination of the brain: Pending  CTA head and neck: Pending  Etta Quill PA-C [facility] [phone] 03/03/2019, 4:39 PM    Assessment: 78 year old female with acute left parietal-occipital hemorrhage.  1. Exam reveals dense receptive aphasia.  It appears that her facial droop and her right-sided weakness have improved.   2. DDx for etiology includes hypertensive hemorrhage occurring in an atypical location, amyloid angiopathy, underlying AVM or tumor.     Plan # Admit to neuro ICU with frequent neuro checks and BP management # MRI of the brain without contrast # CTA head and neck # Transthoracic Echo # Cardiac telemetry  # Hold any antiplatelets and place patient on SCDs  # BP goal: Systolic < 656 # HBAIC and Lipid profile # Telemetry monitoring # Frequent neuro checks # NPO until passes stroke swallow screen # Please see admission orders # Please page stroke NP  Or  PA  Or MD from 8am -4 pm  as this patient from this time will be  followed by the stroke.   You can look them up on [email or web address]  Password [email or web address]  I have seen and examined the patient. I have formulated the assessment and plan. 78 year old female with left parieto-occipital ICH. Exam reveals receptive aphasia. Will obtain MRI brain and CTA head/neck. Plan includes BP management and frequent neuro checks.  Electronically signed: Dr. Kerney Elbe

## 2019-03-03 NOTE — ED Notes (Signed)
Paged out CODE STROKE.

## 2019-03-03 NOTE — Consult Note (Signed)
Chief Complaint   Chief Complaint  Patient presents with  . Cerebrovascular Accident    HPI   Consult requested by: Dr Pilar Plate, EDP at [facility] Reason for consult: left parietoccipital stroke  HPI: [name] is a 78 y.o. female who presented to [facility] ED as a code stroke. Patient is aphasic and unable to provide any history. Son at bedside assists with history. By report, a neighbor who checks on patient found her slightly confused yesterday around 1700 yesterday. Today when the neighbor went back over, she was unable to communicate and was noted to have right sided weakness and subsequently EMS was called. Son also states patient has been complaining of a headache since Thursday. She underwent a stat head CT and was found to have a left parietoccipital hemorrhage so NSY consultation was requested.  Patient remains aphasic. Does point to head and says "pain" and says "I feel sick".  Not on blood thinning agents.  Patient Active Problem List   Diagnosis Date Noted  . Osteoporosis 05/16/2018  . S/P ORIF (open reduction internal fixation) fracture right patella 03/18/18 03/26/2018  . Cellulitis, wound, post-operative 03/22/2018  . Fracture of right patella   . Normocytic anemia   . Esophageal dysphagia 11/27/2017  . Heme positive stool 11/27/2017  . Iron deficiency anemia 12/05/2016  . SIADH (syndrome of inappropriate ADH production) (HCC)   . Polypharmacy 09/14/2016  . Muscle weakness (generalized)   . Syncope 09/10/2016  . Rib pain on left side   . Hypersomnia 02/16/2016  . Acute bronchitis 02/16/2016  . Exertional dyspnea 12/20/2015  . HLD (hyperlipidemia) 11/23/2014  . Change in bowel habits 11/10/2014  . Abdominal pain, left lower quadrant 11/10/2014  . Emphysema lung (HCC) 11/08/2014  . Helicobacter pylori gastritis 07/30/2014  . Acute on chronic respiratory failure with hypoxia (HCC) 04/10/2014  . Wrist fracture 04/09/2014  . Occlusion and stenosis of carotid artery without  mention of cerebral infarction 03/16/2014  . T12 burst fracture (HCC) 01/13/2014  . Constipation 12/14/2013  . Acute respiratory failure with hypoxia (HCC) 12/14/2013  . COPD (chronic obstructive pulmonary disease) (HCC) 12/14/2013  . Compression fracture of thoracic vertebra (HCC) 12/14/2013  . Hypoxia 12/13/2013  . Compression fracture 12/13/2013  . Diastolic dysfunction   . 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 Dyspepsia 02/11/2012  . Aneurysm (HCC) 02/11/2012  . Trigeminal neuralgia   . Epigastric pain 02/21/2011  . Dysphagia 02/21/2011  . 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 HELICOBACTER PYLORI GASTRITIS, HX OF 12/30/2009  . CONTRAST DYE ALLERGY 12/30/2009    PMH: Past Medical History:  Diagnosis Date  . 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 Allergic rhinitis   . 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HCC)    corrected by surgery 08/21/13  . Anxiety disorder   . Aortic aneurysm (HCC)   . Complication of anesthesia 2011   bloodpressure dropped during colonoscopy, not problems since  . Compression fracture   . COPD (chronic obstructive pulmonary disease) (HCC)   . Depression   . Diastolic dysfunction   . Diverticulosis   . Dysphagia   . Emphysema lung (HCC) 11/08/2014  . Hiatal hernia   . Hypothyroidism   . IBS (irritable bowel syndrome)   . Iron deficiency anemia due to chronic blood loss 12/05/2016  . On home O2 12/15/2013   chronic hypoxia  . PUD (peptic ulcer disease)    50yrs  .  Shortness of breath    with exertion  . SIADH (syndrome of inappropriate ADH production) (HCC)   . Trigeminal neuralgia     PSH: Past Surgical History:  Procedure Laterality Date  . ABDOMINAL HYSTERECTOMY    . BACK SURGERY  January 13, 2014  . CATARACT EXTRACTION    . CATARACT EXTRACTION W/PHACO  09/02/2012   Procedure: CATARACT EXTRACTION PHACO AND INTRAOCULAR LENS PLACEMENT (IOC);  Surgeon: Loraine Leriche T.  Nile Riggs, MD;  Location: [facility];  Service: Ophthalmology;  Laterality: Left;  CDE:10.35  . CHOLECYSTECTOMY    . 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 COLONOSCOPY N/A 11/23/2014   Dr. Darrick Penna: moderate diverticulosis, hemorrhoids, redundant colon. next TCS in 10-15 years.   Marland Kitchen ENDARTERECTOMY Right 08/21/2013   Procedure: RIGHT CAROTID ANEURYSM RESECTION;  Surgeon: Larina Earthly, MD;  Location: [facility] OR;  Service: Vascular;  Laterality: Right;  . ESOPHAGEAL DILATION  02/24/2018   Procedure: ESOPHAGEAL DILATION;  Surgeon: West Bali, MD;  Location: [facility];  Service: Endoscopy;;  . ESOPHAGOGASTRODUODENOSCOPY  11/13/2009   w/dilation to 14mm, gastric ulceration (H.Pylori) s/p treatment  . ESOPHAGOGASTRODUODENOSCOPY  11/21/2009   distal esophageal web, gastritis  . ESOPHAGOGASTRODUODENOSCOPY  03/14/12   ZOX:[id number] in the distal esophagus/Mild gastritis/small HH. + H.pylori, prescribed Pylera. Finished treatment.   . ESOPHAGOGASTRODUODENOSCOPY N/A 12/13/2017   Procedure: ESOPHAGOGASTRODUODENOSCOPY (EGD);  Surgeon: West Bali, MD;  Location[facility];  Service: Endoscopy;  Laterality: N/A;  8:30am  . ESOPHAGOGASTRODUODENOSCOPY N/A 02/24/2018   Procedure: ESOPHAGOGASTRODUODENOSCOPY (EGD);  Surgeon: West Bali, MD;  Location: [facility];  Service: Endoscopy;  Laterality: N/A;  11:00am  . fatty tumor removal from lt groin    . FRACTURE SURGERY Left    hand and left ring finger  . GIVENS CAPSULE STUDY N/A 12/26/2017   Procedure: GIVENS CAPSULE STUDY;  Surgeon: West Bali, MD;  [facility]: [facility];  Service: Endoscopy;  Laterality: N/A;  7:30am  . GIVENS CAPSULE STUDY N/A 02/24/2018   Procedure: GIVENS CAPSULE STUDY;  Surgeon: West Bali, MD;  [facility];  Service: Endoscopy;  Laterality: N/A;  . KNEE SURGERY     patella tendon repair june 2019 [facility]  . LUNG CANCER SURGERY  12/2010   Left VATS, minithoracotomy, LLL  superior segmentectomy  . ORIF PATELLA Right 03/18/2018   Procedure: OPEN REDUCTION INTERNAL (ORIF) FIXATION RIGHT PATELLA;  Surgeon: Vickki Hearing, MD;  Location: [facility];  Service: Orthopedics;  Laterality: Right;  . ORIF WRIST FRACTURE Left 04/09/2014   Procedure: OPEN REDUCTION INTERNAL FIXATION (ORIF) WRIST FRACTURE;  Surgeon: Sheral Apley, MD;  Location: [facility] OR;  Service: Orthopedics;  Laterality: Left;  . PERCUTANEOUS PINNING Left 04/09/2014   Procedure: PERCUTANEOUS PINNING EXTREMITY;  Surgeon: Sheral Apley, MD;  Location: [facility] OR;  Service: Orthopedics;  Laterality: Left;  . SAVORY DILATION N/A 12/13/2017   Procedure: SAVORY DILATION;  Surgeon: West Bali, MD;  [facility];  Service: Endoscopy;  Laterality: N/A;  . TUBAL LIGATION    . vocal cord surgery  02/06/2011   laryngoscopy with bilateral vocal cord Radiesse injection for vocal cord paralysis  . YAG LASER APPLICATION Left 09/20/2015   Procedure: YAG LASER APPLICATION;  Surgeon: Jethro Bolus, MD;  Location: [facility];  Service: Ophthalmology;  Laterality: Left;    (Not in a hospital admission)   SH: Social History   Tobacco Use  . Smoking status: Former  Smoker    Packs/day: 0.50    Years: 20.00    Pack years: 10.00    Types: Cigarettes    Last attempt to quit: 12/21/2010    Years since quitting: 8.2  . Smokeless tobacco: Never Used  . Tobacco comment: smoking cessation info given and reviewed   Substance Use Topics  . Alcohol use: Yes    Alcohol/week: 0.0 standard drinks    Comment: seldom  . Drug use: No    MEDS: Prior to Admission medications   Medication Sig Start Date End Date Taking? Authorizing Provider  acetaminophen (TYLENOL) 325 MG tablet Take 650 mg by mouth every 6 (six) hours as needed for moderate pain or headache.     [provider]  ALPRAZolam Prudy Feeler) 0.5 MG tablet TAKE (1) TABLET BY MOUTH TWICE A DAY AS NEEDED. 01/30/19   [name], MD  Calcium-Magnesium-Vitamin  D (CITRACAL CALCIUM+D) 600-40-500 MG-MG-UNIT TB24 Take 1 tablet by mouth daily.    [provider]  citalopram (CELEXA) 40 MG tablet TAKE (1/2) TABLET BY MOUTH AT BEDTIME. 01/30/19   [name], MD  denosumab (PROLIA) 60 MG/ML SOSY injection Inject 60 mg into the skin every 6 (six) months. 12/30/18   [name], MD  ferrous sulfate 325 (65 FE) MG tablet Take 325 mg by mouth daily with breakfast.    [provider]  fluticasone (FLONASE) 50 MCG/ACT nasal spray Place 2 sprays into both nostrils daily. 12/30/18   [name], MD  ibuprofen (ADVIL,MOTRIN) 200 MG tablet Take 200 mg by mouth every 6 (six) hours as needed for headache or mild pain.    [provider]  levothyroxine (SYNTHROID, LEVOTHROID) 50 MCG tablet TAKE 1 TABLET BEFORE BREAKFAST. 12/16/18   [name], MD  magnesium oxide (MAG-OX) 400 MG tablet Take 400 mg by mouth every evening.     [provider]  Multiple Vitamin (MULTIVITAMIN WITH MINERALS) TABS tablet Take 1 tablet by mouth daily.    [provider]  omeprazole (PRILOSEC) 20 MG capsule 1 PO 30 MINS PRIOR TO BREAKFAST. Patient taking differently: Take 20 mg by mouth daily.  12/13/17   West Bali, MD  ondansetron (ZOFRAN ODT) 4 MG disintegrating tablet 4mg  ODT q4 hours prn nausea/vomit Patient not taking: Reported on 11/13/2018 06/25/18   Bethann Berkshire, MD  pantoprazole (PROTONIX) 40 MG tablet Take 1 tablet (40 mg total) by mouth daily before breakfast. 02/05/19   Gelene Mink, NP  umeclidinium-vilanterol (ANORO ELLIPTA) 62.5-25 MCG/INH AEPB Inhale 1 puff into the lungs daily. 06/20/18   [name], MD    ALLERGY: Allergies  Allergen Reactions  . Ciprofloxacin Hcl Hives  . Iohexol Other (See Comments)     Desc: Per [facility], pt is allergic to IV contrast, no type of reaction was available. Pt cannot remember but first reacted around 15 yrs ago from an IVP. Notes were date from 2009 at urology  center., Onset Date: [id number]   . Prozac [Fluoxetine Hcl] Rash  . Sulfonamide Derivatives Hives and Rash    Social History   Tobacco Use  . Smoking status: Former Smoker    Packs/day: 0.50    Years: 20.00    Pack years: 10.00    Types: Cigarettes    Last attempt to quit: 12/21/2010    Years since quitting: 8.2  . Smokeless tobacco: Never Used  . Tobacco comment: smoking cessation info given and reviewed   Substance Use Topics  .  Alcohol use: Yes    Alcohol/week: 0.0 standard drinks    Comment: seldom     Family History  Problem Relation Age of Onset  . Pulmonary embolism Mother   . Heart attack Father   . Hypertension Father   . Liver cancer Sister   . Cancer Sister   . Cancer Brother   . Cancer Daughter   . Colon cancer Neg Hx      ROS   ROS unable to obtain, aphasic  Exam   Vitals:   03/03/19 1330 03/03/19 1353  BP: (!) 108/91 111/74  Pulse: 80 85  Resp: 17 (!) 23  SpO2: 96% (!) 88%   General appearance: elderly female resting comfortably,  GCS: 13 E4V3M6 Eyes: No scleral injection Cardiovascular: Regular rate and rhythm without murmurs, rubs, gallops. No edema or variciosities. Pulmonary: Effort normal, non-labored breathing Musculoskeletal:     Muscle tone upper extremities: Normal    Muscle tone lower extremities: Normal    Motor exam: Upper Extremities Deltoid Bicep Tricep Grip  Right 3/5 3/5 3/5 3/5  Left 5/5 5/5 5/5 5/5   Lower Extremity IP Quad PF DF EHL  Right 3/5 3/5 4/5 4-/5 4-/5  Left 5/5 5/5 5/5 5/5 5/5         Strength testing limited due to patients ability to understand commands at times. Does move at least antigravity RUE/RLE.   Neurological Mental Status:    - Patient is awake, alert    - Expressive aphasia and intermittent receptive aphasia Cranial Nerves    - II: Visual Fields difficult to assess due to difficulties understanding commands at times. PERRL    - III/IV/VI: EOMI     - V: Facial sensation is grossly normal    -  VII: Facial movement is symmetric.     - VIII: hearing is intact to voice    - X: Uvula elevates     - XI: Shoulder shrug is symmetric.    - XII: does not protrude tongue Sensory: Sensation grossly intact to LT Deep Tendon Reflexes    - 2+ and symmetric in the biceps and patellae. Plantars   - Toes are downgoing bilaterally.  Cerebellar    - FNF and HKS unable to be assessed due to difficulty understanding command   Results - Imaging/Labs   Results for orders placed or performed during the hospital encounter of 03/03/19 (from the past 48 hour(s))  CBG monitoring, ED     Status: Abnormal   Collection Time: 03/03/19  1:40 PM  Result Value Ref Range   Glucose-Capillary 110 (H) 70 - 99 mg/dL  SARS Coronavirus 2 (CEPHEID - Performed in [facility] hospital lab), Hosp Order     Status: None   Collection Time: 03/03/19  1:43 PM  Result Value Ref Range   SARS Coronavirus 2 NEGATIVE NEGATIVE    Comment: (NOTE) If result is NEGATIVE SARS-CoV-2 target nucleic acids are NOT DETECTED. The SARS-CoV-2 RNA is generally detectable in upper and lower  respiratory specimens during the acute phase of infection. The lowest  concentration of SARS-CoV-2 viral copies this assay can detect is 250  copies / mL. A negative result does not preclude SARS-CoV-2 infection  and should not be used as the sole basis for treatment or other  patient management decisions.  A negative result may occur with  improper specimen collection / handling, submission of specimen other  than nasopharyngeal swab, presence of viral mutation(s) within the  areas targeted by this assay,  and inadequate number of viral copies  (<250 copies / mL). A negative result must be combined with clinical  observations, patient history, and epidemiological information. If result is POSITIVE SARS-CoV-2 target nucleic acids are DETECTED. The SARS-CoV-2 RNA is generally detectable in upper and lower  respiratory specimens dur ing the acute  phase of infection.  Positive  results are indicative of active infection with SARS-CoV-2.  Clinical  correlation with patient history and other diagnostic information is  necessary to determine patient infection status.  Positive results do  not rule out bacterial infection or co-infection with other viruses. If result is PRESUMPTIVE POSTIVE SARS-CoV-2 nucleic acids MAY BE PRESENT.   A presumptive positive result was obtained on the submitted specimen  and confirmed on repeat testing.  While 2019 novel coronavirus  (SARS-CoV-2) nucleic acids may be present in the submitted sample  additional confirmatory testing may be necessary for epidemiological  and / or clinical management purposes  to differentiate between  SARS-CoV-2 and other Sarbecovirus currently known to infect humans.  If clinically indicated additional testing with an alternate test  methodology [phone]) is advised. The SARS-CoV-2 RNA is generally  detectable in upper and lower respiratory sp ecimens during the acute  phase of infection. The expected result is Negative. Fact Sheet for Patients:  BoilerBrush.com.cy Fact Sheet for Healthcare Providers: https://pope.com/ This test is not yet approved or cleared by the [place] FDA and has been authorized for detection and/or diagnosis of SARS-CoV-2 by FDA under an Emergency Use Authorization (EUA).  This EUA will remain in effect (meaning this test can be used) for the duration of the COVID-19 declaration under Section 564(b)(1) of the Act, 21 U.S.C. section 360bbb-3(b)(1), unless the authorization is terminated or revoked sooner. Performed at [facility], [address] [place], [place] [address]   Ethanol     Status: None   Collection Time: 03/03/19  1:52 PM  Result Value Ref Range   Alcohol, Ethyl (B) <10 <10 mg/dL    Comment: (NOTE) Lowest detectable limit for serum alcohol is 10 mg/dL. For medical purposes  only. Performed at [facility], [address] [place], [place] [address]   Protime-INR     Status: None   Collection Time: 03/03/19  1:52 PM  Result Value Ref Range   Prothrombin Time 12.5 11.4 - 15.2 seconds   INR 0.9 0.8 - 1.2    Comment: (NOTE) INR goal varies based on device and disease states. Performed at [facility], [address], [place], [place] [address]   APTT     Status: None   Collection Time: 03/03/19  1:52 PM  Result Value Ref Range   aPTT 36 24 - 36 seconds    Comment: Performed at [facility], [address] [place], [place] [address]  CBC     Status: None   Collection Time: 03/03/19  1:52 PM  Result Value Ref Range   WBC 9.8 4.0 - 10.5 K/uL   RBC 4.29 3.87 - 5.11 MIL/uL   Hemoglobin 13.0 12.0 - 15.0 g/dL   HCT 08.6 57.8 - 46.9 %   MCV 92.1 80.0 - 100.0 fL   MCH 30.3 26.0 - 34.0 pg   MCHC 32.9 30.0 - 36.0 g/dL   RDW 62.9 52.8 - 41.3 %   Platelets 298 150 - 400 K/uL   nRBC 0.0 0.0 - 0.2 %    Comment: Performed at [facility], [address] [place], [place] [address]  Differential  Status: Abnormal   Collection Time: 03/03/19  1:52 PM  Result Value Ref Range   Neutrophils Relative % 90 %   Neutro Abs 8.8 (H) 1.7 - 7.7 K/uL   Lymphocytes Relative 6 %   Lymphs Abs 0.6 (L) 0.7 - 4.0 K/uL   Monocytes Relative 3 %   Monocytes Absolute 0.3 0.1 - 1.0 K/uL   Eosinophils Relative 0 %   Eosinophils Absolute 0.0 0.0 - 0.5 K/uL   Basophils Relative 1 %   Basophils Absolute 0.1 0.0 - 0.1 K/uL   Immature Granulocytes 0 %   Abs Immature Granulocytes 0.03 0.00 - 0.07 K/uL    Comment: Performed at [facility], [address] [place], [place] [address]  Comprehensive metabolic panel     Status: Abnormal   Collection Time: 03/03/19  1:52 PM  Result Value Ref Range   Sodium 135 135 - 145 mmol/L   Potassium 3.9 3.5 - 5.1 mmol/L   Chloride 95 (L) 98 - 111 mmol/L   CO2 28 22 - 32 mmol/L   Glucose, Bld 115 (H) 70 - 99 mg/dL   BUN 11 8 - 23 mg/dL   Creatinine,  Ser 6.21 0.44 - 1.00 mg/dL   Calcium 9.5 8.9 - 30.8 mg/dL   Total Protein 7.2 6.5 - 8.1 g/dL   Albumin 4.6 3.5 - 5.0 g/dL   AST 19 15 - 41 U/L   ALT 14 0 - 44 U/L   Alkaline Phosphatase 48 38 - 126 U/L   Total Bilirubin 0.6 0.3 - 1.2 mg/dL   GFR calc non Af Amer >60 >60 mL/min   GFR calc Af Amer >60 >60 mL/min   Anion gap 12 5 - 15    Comment: Performed at [facility], [address] [place], [place] [address]  CK     Status: None   Collection Time: 03/03/19  1:52 PM  Result Value Ref Range   Total CK 120 38 - 234 U/L    Comment: Performed at [facility], [address] [place], [place] [address]    Ct Head Code Stroke Wo Contrast  Result Date: 03/03/2019 CLINICAL DATA:  Code stroke. Acute onset of aphasia and right-sided weakness. EXAM: CT HEAD WITHOUT CONTRAST TECHNIQUE: Contiguous axial images were obtained from the base of the skull through the vertex without intravenous contrast. COMPARISON:  CT head without contrast 09/11/2016 FINDINGS: Brain: An acute left parietal and occipital hemorrhage measures 3.5 x 3.7 x 3.0 cm. There is surrounding edema with effacement of the adjacent sulci and minimal mass effect on the posterior horn of the left lateral ventricle. 3 mm midline shift is present. No other focal hemorrhage is present. No other mass lesion or acute infarct is evident. Basal ganglia are intact. Insular ribbon is normal. No significant extra-axial fluid collection is present. Vascular: Minimal calcifications are present. There is no hyperdense vessel. Skull: Calvarium is intact. No focal lytic or blastic lesions are present. Sinuses/Orbits: The paranasal sinuses and mastoid air cells are clear. Bilateral lens replacements are noted. Globes and orbits are otherwise unremarkable. IMPRESSION: 1. Left parietal and occipital parenchymal hematoma measuring up to 3.7 cm. This is nonspecific. It is most likely related to hypertension or underlying vasculitis in a patient of this age. The  differential diagnosis includes metastatic disease or other vascular malformation. No other lesions are evident. 2. Surrounding edema and mass effect is mostly local with minimal midline shift of 3-4 mm. 3. No other focal lesions or hemorrhage. Critical Value/emergent  results were called by telephone at the time of interpretation on 03/03/2019 at 1:39 pm to Dr. Kennis Carina , who verbally acknowledged these results. Electronically Signed   By: Marin Roberts M.D.   On: 03/03/2019 13:46    Impression/Plan   78 y.o. female with left parietoccipital hemorrhage with minimal MLS without hydrocephalus. On exam she is awake with expressive aphasia and has right sided weakness. She is able to follow simple commands. There is no indication for NS intervention at present. Rec aggressive medical care per Neurology.   Cindra Presume, PA-C [facility]

## 2019-03-03 NOTE — ED Triage Notes (Signed)
Pt arrived via [facility] from [facility]. Pt presents with expressive aphasia and and right sided weakness. See NIHSS for complete assessment.

## 2019-03-03 NOTE — ED Notes (Signed)
Neuro surgery called through [facility] for Dr. Sedonia Small.

## 2019-03-03 NOTE — ED Triage Notes (Signed)
LSN 1700 by neighbors yesterday. Pt having trouble speaking and right side weakness.

## 2019-03-03 NOTE — ED Notes (Signed)
[facility] picking up pt for transport at this time.

## 2019-03-03 NOTE — ED Notes (Signed)
CT notified. Teleneuro activated.

## 2019-03-03 NOTE — Progress Notes (Signed)
CT CODE STROKE TIMES 1316 CALL TIME 1316 BEEPER 1323 EXAM STARTED 1324 EXAM FINISHED 1324 IMAGES SENT TO NEURO 1327 EXAM COMPLETED IN PACS [place] CALLED ALL TIMES FROM CT SCANNER.

## 2019-03-04 ENCOUNTER — Inpatient Hospital Stay (HOSPITAL_COMMUNITY): Payer: PPO

## 2019-03-04 DIAGNOSIS — I611 Nontraumatic intracerebral hemorrhage in hemisphere, cortical: Secondary | ICD-10-CM

## 2019-03-04 DIAGNOSIS — G936 Cerebral edema: Secondary | ICD-10-CM | POA: Diagnosis present

## 2019-03-04 MED ORDER — SENNOSIDES-DOCUSATE SODIUM 8.6-50 MG PO TABS
1.0000 | ORAL_TABLET | Freq: Every evening | ORAL | Status: DC | PRN
  Administered 2019-03-10: 1 via ORAL
  Filled 2019-03-04: qty 1

## 2019-03-04 MED ORDER — GADOBUTROL 1 MMOL/ML IV SOLN
5.7000 mL | Freq: Once | INTRAVENOUS | Status: AC | PRN
  Administered 2019-03-04: 5.7 mL via INTRAVENOUS

## 2019-03-04 MED ORDER — GADOBUTROL 1 MMOL/ML IV SOLN
6.0000 mL | Freq: Once | INTRAVENOUS | Status: AC | PRN
  Administered 2019-03-04: 6 mL via INTRAVENOUS

## 2019-03-04 NOTE — Evaluation (Signed)
Physical Therapy Evaluation Patient Details Name: [name] MRN: [id number] DOB: 1940-10-30 Today's Date: 03/04/2019   History of Present Illness  Pt is a 78 y/o female presents with aphasia and R sided weakness. 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 MRI pending.  PMH: anxiety, COPD on home oxgyen, depression, dsyphagia, trigeminal neuralgia, aortic aneurysm.    Clinical Impression  Pt admitted with above diagnosis. Pt currently with functional limitations due to the deficits listed below (see PT Problem List). Prior to admission, pt lives alone, uses walker. On PT evaluation, pt presents with impaired communication (aphasic), decreased cognition, dynamic balance impairments, apraxic at times, and decreased coordination. Requiring two person moderate assistance for ambulation in room, likely would require less with use of walker for external support. Recommending CIR to address deficits and maximize functional independence. Pt will benefit from skilled PT to increase their independence and safety with mobility to allow discharge to the venue listed below.    OT Adonis Brook and I have discussed the patient's current level of function related to stroke  with the patient and pt son .  They acknowledge understanding of this and do not feel the patient would be able to have their care needs met at home.  They are interested in post-acute rehab in an inpatient setting.       Follow Up Recommendations CIR;Supervision/Assistance - 24 hour    Equipment Recommendations  None recommended by PT    Recommendations for Other Services Rehab consult     Precautions / Restrictions Precautions Precautions: Fall Restrictions Weight Bearing Restrictions: No      Mobility  Bed Mobility Overal bed mobility: Needs Assistance Bed Mobility: Supine to Sit     Supine to sit: Min assist     General bed mobility comments: min assist for trunk support, safety and balance   Transfers Overall  transfer level: Needs assistance Equipment used: 2 person hand held assist Transfers: Sit to/from Stand;Stand Pivot Transfers Sit to Stand: +2 safety/equipment;Min assist Stand pivot transfers: +2 safety/equipment;Min assist       General transfer comment: assist to power up, safety and balance; reliant on B UE support   Ambulation/Gait Ambulation/Gait assistance: Mod assist;+2 physical assistance Gait Distance (Feet): 15 Feet Assistive device: 2 person hand held assist Gait Pattern/deviations: Step-through pattern;Decreased stride length;Wide base of support;Decreased dorsiflexion - right;Decreased dorsiflexion - left Gait velocity: decreased   General Gait Details: Pt with modest instability, reliant on external support, reaching for objects in room for further stabilization. Utilizing wide BOS throughout   Stairs            Wheelchair Mobility    Modified Rankin (Stroke Patients Only) Modified Rankin (Stroke Patients Only) Pre-Morbid Rankin Score: Slight disability Modified Rankin: Moderately severe disability     Balance Overall balance assessment: Needs assistance Sitting-balance support: No upper extremity supported;Feet supported Sitting balance-Leahy Scale: Fair     Standing balance support: Bilateral upper extremity supported;During functional activity Standing balance-Leahy Scale: Poor Standing balance comment: reliant on B UE and external support                             Pertinent Vitals/Pain Pain Assessment: Faces Faces Pain Scale: Hurts little more Pain Location: headache Pain Descriptors / Indicators: Discomfort Pain Intervention(s): Monitored during session;Repositioned    Home Living Family/patient expects to be discharged to:: Private residence Living Arrangements: Alone Available Help at Discharge: Family;Available PRN/intermittently(neighbors) Type of Home: Apartment Home Access: Stairs to  enter Entrance Stairs-Rails:  None Entrance Stairs-Number of Steps: 1 Home Layout: One level Home Equipment: [place] - 4 wheels;Shower seat;Other (comment)(oxygen)      Prior Function Level of Independence: Independent with assistive device(s)         Comments: limited IADLS, uses RW for mobility, med mgmt      Hand Dominance   Dominant Hand: Right    Extremity/Trunk Assessment   Upper Extremity Assessment Upper Extremity Assessment: Defer to OT evaluation    Lower Extremity Assessment Lower Extremity Assessment: RLE deficits/detail;LLE deficits/detail RLE Deficits / Details: Strength 5/5 LLE Deficits / Details: Strength 5/5       Communication   Communication: Expressive difficulties  Cognition Arousal/Alertness: Awake/alert Behavior During Therapy: Impulsive;Agitated Overall Cognitive Status: Difficult to assess(due to aphasia)                                 General Comments: pt able to verbalize her name, inconsistently following commands but overall difficult to assess due to aphaisa      General Comments      Exercises     Assessment/Plan    PT Assessment Patient needs continued PT services  PT Problem List Decreased activity tolerance;Decreased balance;Decreased mobility;Decreased coordination;Decreased cognition;Decreased safety awareness       PT Treatment Interventions DME instruction;Gait training;Functional mobility training;Therapeutic activities;Therapeutic exercise;Balance training;Patient/family education;Cognitive remediation    PT Goals (Current goals can be found in the Care Plan section)  Acute Rehab PT Goals Patient Stated Goal: none stated PT Goal Formulation: With patient Time For Goal Achievement: 03/18/19 Potential to Achieve Goals: Good    Frequency Min 4X/week   Barriers to discharge Decreased caregiver support      Co-evaluation PT/OT/SLP Co-Evaluation/Treatment: Yes Reason for Co-Treatment: Necessary to address cognition/behavior  during functional activity;For patient/therapist safety;To address functional/ADL transfers PT goals addressed during session: Mobility/safety with mobility         AM-PAC PT "6 Clicks" Mobility  Outcome Measure Help needed turning from your back to your side while in a flat bed without using bedrails?: None Help needed moving from lying on your back to sitting on the side of a flat bed without using bedrails?: A Little Help needed moving to and from a bed to a chair (including a wheelchair)?: A Little Help needed standing up from a chair using your arms (e.g., wheelchair or bedside chair)?: A Little Help needed to walk in hospital room?: A Lot Help needed climbing 3-5 steps with a railing? : A Lot 6 Click Score: 17    End of Session Equipment Utilized During Treatment: Gait belt;Oxygen Activity Tolerance: Patient tolerated treatment well Patient left: in chair;with call bell/phone within reach;with chair alarm set Nurse Communication: Mobility status PT Visit Diagnosis: Unsteadiness on feet (R26.81);Difficulty in walking, not elsewhere classified (R26.2)    Time: 9833-8250 PT Time Calculation (min) (ACUTE ONLY): 27 min   Charges:   PT Evaluation $PT Eval Moderate Complexity: 1 Mod          Ellamae Sia, [place], DPT Acute Rehabilitation Services Pager [phone] Office [phone]   Willy Eddy 03/04/2019, 11:56 AM

## 2019-03-04 NOTE — Progress Notes (Signed)
Rehab Admissions Coordinator Note:  Patient was screened by Cleatrice Burke for appropriateness for an Inpatient Acute Rehab Consult per OT and SLP recs.  At this time, we are recommending Inpatient Rehab consult.  Cleatrice Burke RN MSN 03/04/2019, 11:57 AM  I can be reached at [phone].

## 2019-03-04 NOTE — Evaluation (Signed)
Speech Language Pathology Evaluation Patient Details Name: BAYYINAH DUKEMAN MRN: [id number] DOB: 1940-11-17 Today's Date: 03/04/2019 Time: 2409-7353 SLP Time Calculation (min) (ACUTE ONLY): 18 min  Problem List:  Patient Active Problem List   Diagnosis Date Noted  . ICH (intracerebral hemorrhage) ([place]) 03/03/2019  . Osteoporosis 05/16/2018  . S/P ORIF (open reduction internal fixation) fracture right patella 03/18/18 03/26/2018  . Cellulitis, wound, post-operative 03/22/2018  . Fracture of right patella   . Normocytic anemia   . Esophageal dysphagia 11/27/2017  . Heme positive stool 11/27/2017  . Iron deficiency anemia 12/05/2016  . SIADH (syndrome of inappropriate ADH production) ([place])   . Polypharmacy 09/14/2016  . Muscle weakness (generalized)   . Syncope 09/10/2016  . Rib pain on left side   . Hypersomnia 02/16/2016  . Acute bronchitis 02/16/2016  . Exertional dyspnea 12/20/2015  . HLD (hyperlipidemia) 11/23/2014  . Change in bowel habits 11/10/2014  . Abdominal pain, left lower quadrant 11/10/2014  . Emphysema lung ([place]) 11/08/2014  . Helicobacter pylori gastritis 07/30/2014  . Acute on chronic respiratory failure with hypoxia ([place]) 04/10/2014  . Wrist fracture 04/09/2014  . 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 T12 burst fracture ([place]) 01/13/2014  . Constipation 12/14/2013  . Acute respiratory failure with hypoxia ([place]) 12/14/2013  . COPD (chronic obstructive pulmonary disease) ([place]) 12/14/2013  . Compression fracture of thoracic vertebra ([place]) 12/14/2013  . Hypoxia 12/13/2013  . Compression fracture 12/13/2013  . Diastolic dysfunction   . 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 Dyspepsia 02/11/2012  . Aneurysm ([place]) 02/11/2012  . Trigeminal neuralgia   . Epigastric pain 02/21/2011  . Dysphagia 02/21/2011  . 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  HELICOBACTER PYLORI GASTRITIS, HX OF 12/30/2009  . CONTRAST DYE ALLERGY 12/30/2009   Past Medical History:  Past Medical History:  Diagnosis Date  . 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 Allergic rhinitis   . 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 Anxiety disorder   . Aortic aneurysm ([place])   . Complication of anesthesia 2011   bloodpressure dropped during colonoscopy, not problems since  . Compression fracture   . COPD (chronic obstructive pulmonary disease) ([place])   . Depression   . Diastolic dysfunction   . Diverticulosis   . Dysphagia   . Emphysema lung ([place]) 11/08/2014  . Hiatal hernia   . Hypothyroidism   . IBS (irritable bowel syndrome)   . Iron deficiency anemia due to chronic blood loss 12/05/2016  . On home O2 12/15/2013   chronic hypoxia  . PUD (peptic ulcer disease)    75yrs  . Shortness of breath    with exertion  . SIADH (syndrome of inappropriate ADH production) ([place])   . Trigeminal neuralgia    Past Surgical History:  Past Surgical History:  Procedure Laterality Date  . ABDOMINAL HYSTERECTOMY    . BACK SURGERY  January 13, 2014  . CATARACT EXTRACTION    . CATARACT EXTRACTION W/PHACO  09/02/2012   Procedure: CATARACT EXTRACTION PHACO AND INTRAOCULAR LENS PLACEMENT (IOC);  Surgeon: Elta Guadeloupe T. Gershon Crane, MD;  Location: [facility];  Service: Ophthalmology;  Laterality: Left;  CDE:10.35  . CHOLECYSTECTOMY    . 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 COLONOSCOPY N/A 11/23/2014   Dr. Oneida Alar: moderate diverticulosis, hemorrhoids, redundant colon.  next TCS in 10-15 years.   Marland Kitchen ENDARTERECTOMY Right 08/21/2013   Procedure: RIGHT CAROTID ANEURYSM RESECTION;  Surgeon: Rosetta Posner, MD;  Location: [facility];  Service: Vascular;  Laterality: Right;  . ESOPHAGEAL DILATION  02/24/2018   Procedure: ESOPHAGEAL DILATION;  Surgeon: Danie Binder, MD;  Location: [facility];  Service: Endoscopy;;  .  ESOPHAGOGASTRODUODENOSCOPY  11/13/2009   w/dilation to 25mm, gastric ulceration (H.Pylori) s/p treatment  . ESOPHAGOGASTRODUODENOSCOPY  11/21/2009   distal esophageal web, gastritis  . ESOPHAGOGASTRODUODENOSCOPY  03/14/12   ZYS:AYTKZSWFU in the distal esophagus/Mild gastritis/small HH. + H.pylori, prescribed Pylera. Finished treatment.   . ESOPHAGOGASTRODUODENOSCOPY N/A 12/13/2017   Procedure: ESOPHAGOGASTRODUODENOSCOPY (EGD);  Surgeon: Danie Binder, MD;  [facility];  Service: Endoscopy;  Laterality: N/A;  8:30am  . ESOPHAGOGASTRODUODENOSCOPY N/A 02/24/2018   Procedure: ESOPHAGOGASTRODUODENOSCOPY (EGD);  Surgeon: Danie Binder, MD;  [facility];  Service: Endoscopy;  Laterality: N/A;  11:00am  . fatty tumor removal from lt groin    . FRACTURE SURGERY Left    hand and left ring finger  . GIVENS CAPSULE STUDY N/A 12/26/2017   Procedure: GIVENS CAPSULE STUDY;  Surgeon: Danie Binder, MD;  [facility];  Service: Endoscopy;  Laterality: N/A;  7:30am  . GIVENS CAPSULE STUDY N/A 02/24/2018   Procedure: GIVENS CAPSULE STUDY;  Surgeon: Danie Binder, MD;  [facility];  Service: Endoscopy;  Laterality: N/A;  . KNEE SURGERY     patella tendon repair june 2019 [place]  . LUNG CANCER SURGERY  12/2010   Left VATS, minithoracotomy, LLL superior segmentectomy  . ORIF PATELLA Right 03/18/2018   Procedure: OPEN REDUCTION INTERNAL (ORIF) FIXATION RIGHT PATELLA;  Surgeon: Carole Civil, MD;  Location: [facility];  Service: Orthopedics;  Laterality: Right;  . ORIF WRIST FRACTURE Left 04/09/2014   Procedure: OPEN REDUCTION INTERNAL FIXATION (ORIF) WRIST FRACTURE;  Surgeon: Renette Butters, MD;  Location: [facility];  Service: Orthopedics;  Laterality: Left;  . PERCUTANEOUS PINNING Left 04/09/2014   Procedure: PERCUTANEOUS PINNING EXTREMITY;  Surgeon: Renette Butters, MD;  Location: [facility];  Service: Orthopedics;  Laterality: Left;  . SAVORY DILATION N/A 12/13/2017    Procedure: SAVORY DILATION;  Surgeon: Danie Binder, MD;  [facility];  Service: Endoscopy;  Laterality: N/A;  . TUBAL LIGATION    . vocal cord surgery  02/06/2011   laryngoscopy with bilateral vocal cord Radiesse injection for vocal cord paralysis  . YAG LASER APPLICATION Left 93/23/5573   Procedure: YAG LASER APPLICATION;  Surgeon: Rutherford Guys, MD;  Location: [facility];  Service: Ophthalmology;  Laterality: Left;   HPI:  Pt is a 78 y.o. female with history of trigeminal neuralgia, on home O2, IBS, dysphagia, diastolic dysfunction, COPD, aortic aneurysm and anxiety.  Patient presented to [facility] ED as a code stroke secondary to aphasia and right-sided weakness. She was transferred to [facility] for further care. MRI of the brain revealed acute intraparenchymal hemorrhage centered at the left parietal convexity, estimated volume 25 cc. Surrounding vasogenic edema with mild regional mass effect without midline shift.   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 She was inconsistently able to follow 1-step commands but exhibited difficulty with 2-step commands, picture identification, and answering yes/no questions. Expressively, she was able to name pictured objects and complete some verbally-presented sentences but  she exhibited difficulty with sentence repetition, and responsive naming. Perseveration was noted throughout the evaluation with the pt frequently stating, "Mar[place], my name is" or "My name is Sharise" in response to questions. Skilled SLP services are clinically indicated at this time to improve aphasia.     SLP Assessment  SLP Recommendation/Assessment: Patient needs continued Speech Lanaguage Pathology Services SLP Visit Diagnosis: Aphasia (R47.01)    Follow Up Recommendations  Inpatient Rehab    Frequency and Duration min  2x/week  2 weeks      SLP Evaluation Cognition  Overall Cognitive Status: Difficult to assess(due to aphasia) Orientation Level: Oriented to person;Oriented to place;Oriented to situation Attention: Focused Focused Attention: Appears intact       Comprehension  Auditory Comprehension Overall Auditory Comprehension: Impaired Yes/No Questions: Impaired Basic Biographical Questions: (0/5) Complex Questions: (0/5) Commands: Impaired One Step Basic Commands: (4/5) Two Step Basic Commands: (0/4) Visual Recognition/Discrimination Discrimination: Exceptions to [facility] Photographs: Unable to indentify Reading Comprehension Reading Status: Not tested    Expression Expression Primary Mode of Expression: Verbal Verbal Expression Overall Verbal Expression: Impaired Initiation: No impairment Automatic Speech: Counting;Month of year;Day of week(With reapeated attempts) Level of Generative/Spontaneous Verbalization: Conversation Repetition: Impaired Level of Impairment: Phrase level(0/5) Naming: Impairment Responsive: (3/4) Confrontation: Within functional limits(10/10) Photographs: Unable to indentify Convergent: (0/5) Verbal Errors: Perseveration;Not aware of errors   Oral / Motor  Oral Motor/Sensory Function Overall Oral Motor/Sensory Function: Within functional limits Motor Speech Overall Motor Speech: Appears within functional limits for tasks assessed Respiration: Within functional limits Phonation: Normal Resonance: Within functional limits Articulation: Within functional limitis Intelligibility: Intelligible Motor Planning: Witnin functional limits Motor Speech Errors: Not applicable   Averey Koning I. [name], [place], [place] Office number [phone] Pager [place] 03/04/2019, 9:43 AM

## 2019-03-04 NOTE — Progress Notes (Signed)
Inpatient Rehab Admissions:  Inpatient Rehab Consult received.  I met with patient at the bedside for rehabilitation assessment and to discuss goals and expectations of an inpatient rehab admission.  She demonstrates aphasia, but is fairly accurate with yes/no questions.  She is interested in CIR but not sure whether her family would be able to provide 24/7 supervision at discharge and, given her aphasia, I think it is likely this will be necessary.  I contacted her son, Quillian Quince, and he is going to speak with his family this evening to determine whether they would be able to work out 24/7 supervision or whether they need to pursue SNF for longer term rehab.  I will follow up with him tomorrow afternoon.   Signed: Shann Medal, PT, DPT Admissions Coordinator [phone] 03/04/19  2:32 PM

## 2019-03-04 NOTE — Progress Notes (Signed)
  Speech Language Pathology Treatment: Cognitive-Linquistic(Aphasia)  Patient Details Name: [name] MRN: [id number] DOB: January 02, 1941 Today's Date: 03/04/2019 Time: 5397-6734 SLP Time Calculation (min) (ACUTE ONLY): 13 min  Assessment / Plan / Recommendation Clinical Impression  Pt participated in an aphasia treatment session. She was cooperative throughout the session but requested that tasks requiring visuals not be completed due to complaint of head/eye ache and the session was ultimately terminated prematurely per the pt's request. She answered simple yes/no questions with 60% accuracy increasing to 80% with re-phrasing. She achieved 25% accuracy with responsive naming increasing to 100% accuracy with mod cues. She inconsistently responded to open-ended questions when options were provided. SLP will continue to follow pt.    HPI HPI: Pt is a 78 y.o. female with history of trigeminal neuralgia, on home O2, IBS, dysphagia, diastolic dysfunction, COPD, aortic aneurysm and anxiety.  Patient presented to [facility] ED as a code stroke secondary to aphasia and right-sided weakness. She was transferred to [facility] for further care. MRI of the brain revealed acute intraparenchymal hemorrhage centered at the left parietal convexity, estimated volume 25 cc. Surrounding vasogenic edema with mild regional mass effect without midline shift.      SLP Plan  Continue with current plan of care  Patient needs continued Speech Lanaguage Pathology Services    Recommendations                   Follow up Recommendations: Inpatient Rehab SLP Visit Diagnosis: Aphasia (R47.01) Plan: Continue with current plan of care       Jerrelle Michelsen I. [name], [place], [place] Office number [phone] Pager [place] 03/04/2019, 9:51 AM

## 2019-03-04 NOTE — Progress Notes (Signed)
PT Cancellation Note  Patient Details Name: [name] MRN: [id number] DOB: 1941/06/22   Cancelled Treatment:    Reason Eval/Treat Not Completed: Active bedrest order   Ellamae Sia, PT, DPT Acute Rehabilitation Services Pager [phone] Office [phone]    Willy Eddy 03/04/2019, 8:08 AM

## 2019-03-04 NOTE — Progress Notes (Addendum)
STROKE TEAM PROGRESS NOTE   HISTORY OF PRESENT ILLNESS (per record) [name] is a 78 y.o. female with history of trigeminal neuralgia, on home O2, IBS, dysphagia, diastolic dysfunction, COPD, aortic aneurysm and anxiety.  Per notes patient presented to the [facility] ED as a code stroke. By report, neighbor who checks on the patient found Ruth slightly confused on 5/25. When checked on the following day, 5/26, the patient was unable to communicate and was noted to have right-sided weakness and subsequently EMS was called. Also noted that the patient had been complaining of headaches since the previous Thursday.CT in the ED and was found to have a left parietal-occipital hemorrhage.  Neurosurgery was consulted and determined that there was no indication for neurosurgical intervention.  Patient was then transferred to [facility] for further care. SUBJECTIVE (INTERVAL HISTORY) [name] is at the bedside. [name] called to update by phone. Neuro exam stable, still with some mixed aphasia, but seems to be improving some.  Blood pressure adequately controlled even without medications  OBJECTIVE Vitals:   03/04/19 0700 03/04/19 0756 03/04/19 0800 03/04/19 0900  BP: (!) 98/59  (!) 102/58 120/73  Pulse: 67  66 78  Resp: 13  14 (!) 23  Temp:  98.2 F (36.8 C)    TempSrc:  Oral    SpO2: 95%  95% 99%  Weight:      Height:        CBC:  Recent Labs  Lab 03/03/19 1352  WBC 9.8  NEUTROABS 8.8*  HGB 13.0  HCT 39.5  MCV 92.1  PLT 951    Basic Metabolic Panel:  Recent Labs  Lab 03/03/19 1352  NA 135  K 3.9  CL 95*  CO2 28  GLUCOSE 115*  BUN 11  CREATININE 0.53  CALCIUM 9.5    Lipid Panel:     Component Value Date/Time   CHOL 217 (H) 10/25/2014 1127   TRIG 86 10/25/2014 1127   HDL 73 10/25/2014 1127   CHOLHDL 3.0 10/25/2014 1127   VLDL 17 10/25/2014 1127   LDLCALC 127 (H) 10/25/2014 1127   HgbA1c: No results found for: HGBA1C Urine Drug Screen: No results found for:  LABOPIA, COCAINSCRNUR, LABBENZ, AMPHETMU, THCU, LABBARB  Alcohol Level     Component Value Date/Time   ETH <10 03/03/2019 1352    IMAGING   Ct Head Wo Contrast  Result Date: 03/04/2019 CLINICAL DATA:  Intracranial hemorrhage follow-up EXAM: CT HEAD WITHOUT CONTRAST TECHNIQUE: Contiguous axial images were obtained from the base of the skull through the vertex without intravenous contrast. COMPARISON:  Brain MRI from yesterday FINDINGS: Brain: Up to 4.2 Cm (craniocaudal) parenchymal hematoma in the left parietal lobe that is unchanged in extent when compared to brain MRI yesterday. There is a rim of edema that is also stable. Local swelling without midline shift. No hydrocephalus or extra-axial collection. No visible infarct Vascular: Atherosclerotic calcification Skull: No acute or aggressive finding Sinuses/Orbits: Bilateral cataract resection. IMPRESSION: Size stable left parietal hematoma with edema. No new abnormality or midline shift. Electronically Signed   By: Monte Fantasia M.D.   On: 03/04/2019 05:25   Mr Jodene Nam Head Wo Contrast  Result Date: 03/04/2019 CLINICAL DATA:  Initial evaluation for acute aphasia, right-sided weakness, confusion. EXAM: MRI HEAD WITHOUT CONTRAST MRA HEAD WITHOUT CONTRAST MRA NECK WITHOUT AND WITH CONTRAST TECHNIQUE: Multiplanar, multiecho pulse sequences of the brain and surrounding structures were obtained without intravenous contrast. Angiographic images of the Circle of Willis were  obtained using MRA technique without intravenous contrast. Angiographic images of the neck were obtained using MRA technique without and with intravenous contrast. Carotid stenosis measurements (when applicable) are obtained utilizing NASCET criteria, using the distal internal carotid diameter as the denominator. CONTRAST:  5.7 cc Gadavist. COMPARISON:  Prior CT from earlier the same day. FINDINGS: MRI HEAD FINDINGS Cerebral volume within normal limits for age. Minimal T2/FLAIR  hyperintensity noted within the periventricular white matter, nonspecific, felt to be within normal limits for age. Previously identified intraparenchymal hemorrhage centered at the left parietal lobe again seen, measuring 2.9 x 4.0 x 4.3 cm (estimated volume 25 cc). Surrounding vasogenic edema with mild regional mass effect. No midline shift. No intraventricular or extra-axial extension. No discernible underlying mass lesion or other abnormality. No significant dot burden to suggest cerebral amyloid angiopathy. No findings to suggest overlying trauma. Etiology unclear. No other abnormal foci of restricted diffusion to suggest acute or subacute ischemia. Gray-white matter differentiation otherwise maintained. No encephalomalacia to suggest chronic cortical infarction. No other acute or chronic intracranial hemorrhage. No mass lesion or midline shift. No hydrocephalus. No extra-axial fluid collection. Pituitary gland suprasellar region normal. Midline structures intact. Major intracranial vascular flow voids maintained. Craniocervical junction normal. No focal marrow replacing lesion. Scalp soft tissues unremarkable. Patient status post bilateral ocular lens replacement. Paranasal sinuses are clear. Small left mastoid effusion noted, of doubtful significance. MRA HEAD FINDINGS ANTERIOR CIRCULATION: Examination moderately degraded by motion artifact. Distal cervical segments of the internal carotid arteries are patent with symmetric antegrade flow. Distal cervical left ICA tortuous. Petrous, cavernous, and supraclinoid segments patent without appreciable stenosis. A1 segments patent bilaterally. Anterior cerebral arteries patent to their distal aspects without definite stenosis. Anterior communicating artery not well assessed on this motion degraded exam. No M1 occlusion. Possible short-segment mild distal left M1 stenosis noted (series 1052, image 11). Normal MCA bifurcations. Distal MCA branches well perfused and  grossly symmetric. No vascular abnormality seen underlying the partially visualized left parietal hematoma. POSTERIOR CIRCULATION: Vertebral arteries patent to the vertebrobasilar junction without stenosis. Vertebral arteries largely code dominant. Basilar widely patent to its distal aspect. Superior cerebral arteries patent bilaterally. Both of the posterior cerebral arteries primarily supplied via the basilar and are well perfused to their distal aspects. No definite flow-limiting stenosis. Prominent left posterior communicating artery noted. No visible aneurysm or other vascular abnormality on this motion degraded exam. MRA NECK FINDINGS Examination technically limited by motion artifact. Visualized aortic arch of normal caliber with normal 3 vessel morphology. No flow-limiting stenosis about the origin of the great vessels. Visualized subclavian arteries widely patent. Right common carotid artery widely patent from its origin to the bifurcation. Mild atheromatous irregularity about the right bifurcation without hemodynamically significant stenosis. Right ICA patent distally to the circle-of-Willis without stenosis or occlusion. Left common carotid artery patent from its origin to the bifurcation without stenosis. No significant atheromatous narrowing about the left bifurcation. Left ICA tortuous but patent to the circle-of-Willis without stenosis or occlusion. Both of the vertebral arteries arise from the subclavian arteries. Vertebral arteries largely code dominant. Probable short-segment moderate stenosis at the origin of the left vertebral artery (series 1084, image 14). Vertebral arteries otherwise widely patent within the neck without stenosis or occlusion. IMPRESSION: MRI HEAD IMPRESSION: 1. Stable size of acute intraparenchymal hemorrhage centered at the left parietal convexity, estimated volume 25 cc. Surrounding vasogenic edema with mild regional mass effect without midline shift. No identifiable  etiology identified on this exam. 2. Otherwise normal brain MRI  for age. MRA HEAD IMPRESSION: 1. Technically limited exam due to moderate motion degradation. 2. Grossly negative intracranial MRA. No large vessel occlusion, hemodynamically significant stenosis, or other acute vascular process. No vascular abnormality seen underlying the left parietal hemorrhage. MRA NECK IMPRESSION: 1. Wide patency of the carotid artery systems bilaterally within the neck. 2. Short-segment moderate stenosis at the origin of the left vertebral artery. Vertebral arteries otherwise widely patent within the neck. Vertebral arteries are co dominant. Electronically Signed   By: Jeannine Boga M.D.   On: 03/04/2019 01:21   Mr Jodene Nam Neck W Wo Contrast  Result Date: 03/04/2019 CLINICAL DATA:  Initial evaluation for acute aphasia, right-sided weakness, confusion. EXAM: MRI HEAD WITHOUT CONTRAST MRA HEAD WITHOUT CONTRAST MRA NECK WITHOUT AND WITH CONTRAST TECHNIQUE: Multiplanar, multiecho pulse sequences of the brain and surrounding structures were obtained without intravenous contrast. Angiographic images of the Circle of Willis were obtained using MRA technique without intravenous contrast. Angiographic images of the neck were obtained using MRA technique without and with intravenous contrast. Carotid stenosis measurements (when applicable) are obtained utilizing NASCET criteria, using the distal internal carotid diameter as the denominator. CONTRAST:  5.7 cc Gadavist. COMPARISON:  Prior CT from earlier the same day. FINDINGS: MRI HEAD FINDINGS Cerebral volume within normal limits for age. Minimal T2/FLAIR hyperintensity noted within the periventricular white matter, nonspecific, felt to be within normal limits for age. Previously identified intraparenchymal hemorrhage centered at the left parietal lobe again seen, measuring 2.9 x 4.0 x 4.3 cm (estimated volume 25 cc). Surrounding vasogenic edema with mild regional mass effect. No  midline shift. No intraventricular or extra-axial extension. No discernible underlying mass lesion or other abnormality. No significant dot burden to suggest cerebral amyloid angiopathy. No findings to suggest overlying trauma. Etiology unclear. No other abnormal foci of restricted diffusion to suggest acute or subacute ischemia. Gray-white matter differentiation otherwise maintained. No encephalomalacia to suggest chronic cortical infarction. No other acute or chronic intracranial hemorrhage. No mass lesion or midline shift. No hydrocephalus. No extra-axial fluid collection. Pituitary gland suprasellar region normal. Midline structures intact. Major intracranial vascular flow voids maintained. Craniocervical junction normal. No focal marrow replacing lesion. Scalp soft tissues unremarkable. Patient status post bilateral ocular lens replacement. Paranasal sinuses are clear. Small left mastoid effusion noted, of doubtful significance. MRA HEAD FINDINGS ANTERIOR CIRCULATION: Examination moderately degraded by motion artifact. Distal cervical segments of the internal carotid arteries are patent with symmetric antegrade flow. Distal cervical left ICA tortuous. Petrous, cavernous, and supraclinoid segments patent without appreciable stenosis. A1 segments patent bilaterally. Anterior cerebral arteries patent to their distal aspects without definite stenosis. Anterior communicating artery not well assessed on this motion degraded exam. No M1 occlusion. Possible short-segment mild distal left M1 stenosis noted (series 1052, image 11). Normal MCA bifurcations. Distal MCA branches well perfused and grossly symmetric. No vascular abnormality seen underlying the partially visualized left parietal hematoma. POSTERIOR CIRCULATION: Vertebral arteries patent to the vertebrobasilar junction without stenosis. Vertebral arteries largely code dominant. Basilar widely patent to its distal aspect. Superior cerebral arteries patent  bilaterally. Both of the posterior cerebral arteries primarily supplied via the basilar and are well perfused to their distal aspects. No definite flow-limiting stenosis. Prominent left posterior communicating artery noted. No visible aneurysm or other vascular abnormality on this motion degraded exam. MRA NECK FINDINGS Examination technically limited by motion artifact. Visualized aortic arch of normal caliber with normal 3 vessel morphology. No flow-limiting stenosis about the origin of the great vessels. Visualized subclavian arteries widely  patent. Right common carotid artery widely patent from its origin to the bifurcation. Mild atheromatous irregularity about the right bifurcation without hemodynamically significant stenosis. Right ICA patent distally to the circle-of-Willis without stenosis or occlusion. Left common carotid artery patent from its origin to the bifurcation without stenosis. No significant atheromatous narrowing about the left bifurcation. Left ICA tortuous but patent to the circle-of-Willis without stenosis or occlusion. Both of the vertebral arteries arise from the subclavian arteries. Vertebral arteries largely code dominant. Probable short-segment moderate stenosis at the origin of the left vertebral artery (series 1084, image 14). Vertebral arteries otherwise widely patent within the neck without stenosis or occlusion. IMPRESSION: MRI HEAD IMPRESSION: 1. Stable size of acute intraparenchymal hemorrhage centered at the left parietal convexity, estimated volume 25 cc. Surrounding vasogenic edema with mild regional mass effect without midline shift. No identifiable etiology identified on this exam. 2. Otherwise normal brain MRI for age. MRA HEAD IMPRESSION: 1. Technically limited exam due to moderate motion degradation. 2. Grossly negative intracranial MRA. No large vessel occlusion, hemodynamically significant stenosis, or other acute vascular process. No vascular abnormality seen underlying  the left parietal hemorrhage. MRA NECK IMPRESSION: 1. Wide patency of the carotid artery systems bilaterally within the neck. 2. Short-segment moderate stenosis at the origin of the left vertebral artery. Vertebral arteries otherwise widely patent within the neck. Vertebral arteries are co dominant. Electronically Signed   By: Jeannine Boga M.D.   On: 03/04/2019 01:21   Mr Brain Wo Contrast  Result Date: 03/04/2019 CLINICAL DATA:  Initial evaluation for acute aphasia, right-sided weakness, confusion. EXAM: MRI HEAD WITHOUT CONTRAST MRA HEAD WITHOUT CONTRAST MRA NECK WITHOUT AND WITH CONTRAST TECHNIQUE: Multiplanar, multiecho pulse sequences of the brain and surrounding structures were obtained without intravenous contrast. Angiographic images of the Circle of Willis were obtained using MRA technique without intravenous contrast. Angiographic images of the neck were obtained using MRA technique without and with intravenous contrast. Carotid stenosis measurements (when applicable) are obtained utilizing NASCET criteria, using the distal internal carotid diameter as the denominator. CONTRAST:  5.7 cc Gadavist. COMPARISON:  Prior CT from earlier the same day. FINDINGS: MRI HEAD FINDINGS Cerebral volume within normal limits for age. Minimal T2/FLAIR hyperintensity noted within the periventricular white matter, nonspecific, felt to be within normal limits for age. Previously identified intraparenchymal hemorrhage centered at the left parietal lobe again seen, measuring 2.9 x 4.0 x 4.3 cm (estimated volume 25 cc). Surrounding vasogenic edema with mild regional mass effect. No midline shift. No intraventricular or extra-axial extension. No discernible underlying mass lesion or other abnormality. No significant dot burden to suggest cerebral amyloid angiopathy. No findings to suggest overlying trauma. Etiology unclear. No other abnormal foci of restricted diffusion to suggest acute or subacute ischemia. Gray-white  matter differentiation otherwise maintained. No encephalomalacia to suggest chronic cortical infarction. No other acute or chronic intracranial hemorrhage. No mass lesion or midline shift. No hydrocephalus. No extra-axial fluid collection. Pituitary gland suprasellar region normal. Midline structures intact. Major intracranial vascular flow voids maintained. Craniocervical junction normal. No focal marrow replacing lesion. Scalp soft tissues unremarkable. Patient status post bilateral ocular lens replacement. Paranasal sinuses are clear. Small left mastoid effusion noted, of doubtful significance. MRA HEAD FINDINGS ANTERIOR CIRCULATION: Examination moderately degraded by motion artifact. Distal cervical segments of the internal carotid arteries are patent with symmetric antegrade flow. Distal cervical left ICA tortuous. Petrous, cavernous, and supraclinoid segments patent without appreciable stenosis. A1 segments patent bilaterally. Anterior cerebral arteries patent to their distal aspects without  definite stenosis. Anterior communicating artery not well assessed on this motion degraded exam. No M1 occlusion. Possible short-segment mild distal left M1 stenosis noted (series 1052, image 11). Normal MCA bifurcations. Distal MCA branches well perfused and grossly symmetric. No vascular abnormality seen underlying the partially visualized left parietal hematoma. POSTERIOR CIRCULATION: Vertebral arteries patent to the vertebrobasilar junction without stenosis. Vertebral arteries largely code dominant. Basilar widely patent to its distal aspect. Superior cerebral arteries patent bilaterally. Both of the posterior cerebral arteries primarily supplied via the basilar and are well perfused to their distal aspects. No definite flow-limiting stenosis. Prominent left posterior communicating artery noted. No visible aneurysm or other vascular abnormality on this motion degraded exam. MRA NECK FINDINGS Examination technically  limited by motion artifact. Visualized aortic arch of normal caliber with normal 3 vessel morphology. No flow-limiting stenosis about the origin of the great vessels. Visualized subclavian arteries widely patent. Right common carotid artery widely patent from its origin to the bifurcation. Mild atheromatous irregularity about the right bifurcation without hemodynamically significant stenosis. Right ICA patent distally to the circle-of-Willis without stenosis or occlusion. Left common carotid artery patent from its origin to the bifurcation without stenosis. No significant atheromatous narrowing about the left bifurcation. Left ICA tortuous but patent to the circle-of-Willis without stenosis or occlusion. Both of the vertebral arteries arise from the subclavian arteries. Vertebral arteries largely code dominant. Probable short-segment moderate stenosis at the origin of the left vertebral artery (series 1084, image 14). Vertebral arteries otherwise widely patent within the neck without stenosis or occlusion. IMPRESSION: MRI HEAD IMPRESSION: 1. Stable size of acute intraparenchymal hemorrhage centered at the left parietal convexity, estimated volume 25 cc. Surrounding vasogenic edema with mild regional mass effect without midline shift. No identifiable etiology identified on this exam. 2. Otherwise normal brain MRI for age. MRA HEAD IMPRESSION: 1. Technically limited exam due to moderate motion degradation. 2. Grossly negative intracranial MRA. No large vessel occlusion, hemodynamically significant stenosis, or other acute vascular process. No vascular abnormality seen underlying the left parietal hemorrhage. MRA NECK IMPRESSION: 1. Wide patency of the carotid artery systems bilaterally within the neck. 2. Short-segment moderate stenosis at the origin of the left vertebral artery. Vertebral arteries otherwise widely patent within the neck. Vertebral arteries are co dominant. Electronically Signed   By: Jeannine Boga M.D.   On: 03/04/2019 01:21   Ct Head Code Stroke Wo Contrast  Result Date: 03/03/2019 CLINICAL DATA:  Code stroke. Acute onset of aphasia and right-sided weakness. EXAM: CT HEAD WITHOUT CONTRAST TECHNIQUE: Contiguous axial images were obtained from the base of the skull through the vertex without intravenous contrast. COMPARISON:  CT head without contrast 09/11/2016 FINDINGS: Brain: An acute left parietal and occipital hemorrhage measures 3.5 x 3.7 x 3.0 cm. There is surrounding edema with effacement of the adjacent sulci and minimal mass effect on the posterior horn of the left lateral ventricle. 3 mm midline shift is present. No other focal hemorrhage is present. No other mass lesion or acute infarct is evident. Basal ganglia are intact. Insular ribbon is normal. No significant extra-axial fluid collection is present. Vascular: Minimal calcifications are present. There is no hyperdense vessel. Skull: Calvarium is intact. No focal lytic or blastic lesions are present. Sinuses/Orbits: The paranasal sinuses and mastoid air cells are clear. Bilateral lens replacements are noted. Globes and orbits are otherwise unremarkable. IMPRESSION: 1. Left parietal and occipital parenchymal hematoma measuring up to 3.7 cm. This is nonspecific. It is most likely related to hypertension or  underlying vasculitis in a patient of this age. The differential diagnosis includes metastatic disease or other vascular malformation. No other lesions are evident. 2. Surrounding edema and mass effect is mostly local with minimal midline shift of 3-4 mm. 3. No other focal lesions or hemorrhage. Critical Value/emergent results were called by telephone at the time of interpretation on 03/03/2019 at 1:39 pm to Dr. Gerlene Fee , who verbally acknowledged these results. Electronically Signed   By: San Morelle M.D.   On: 03/03/2019 13:46     Transthoracic Echocardiogram  00/00/2020 Pending  PHYSICAL EXAM Blood pressure  120/73, pulse 78, temperature 98.2 F (36.8 C), temperature source Oral, resp. rate (!) 23, height 5\' 5"  (1.651 m), weight 57 kg, SpO2 99 %.  Constitutional: Appears well-developed and well-nourished.  Elderly Caucasian lady not in distress Eyes: No scleral injection HENT: No OP obstrucion Head: Normocephalic.  Cardiovascular: Normal rate and regular rhythm.  Respiratory: Effort normal, non-labored breathing GI: Soft.  No distension. There is no tenderness.  Skin: WDI  Neuro: Mental Status: Patient is awake. Has mixed aphasia with nonfluent speech, but expressive worse than receptive aphasia. She fixates on certain words such as "Thursday" and repeats this as Ruth answer to subsequent questions. She has difficulty naming, but after a few tries, is able to ultimately name them.  She can follow only simple midline and one-step commands Cranial Nerves: II: Given Ruth aphasia this is difficult however she blinks to threat seen in all quadrants   III,IV, VI: EOMI without ptosis or diplopia. Pupils equal, round and reactive to light V: Facial sensation is symmetric VII: Facial movement is symmetric.  VIII: hearing is intact to voice X: Palate elevates symmetrically XI: Shoulder shrug is symmetric. XII: tongue is midline without atrophy or fasciculations.  Motor:  moving all extremities with 5/5 strength-does seem to express that Ruth right arm has improved.  Diminished fine finger movements on the right and orbits left over right upper extremity. Sensory: Withdraws from noxious stimuli in all 4 extremities Deep Tendon Reflexes: 2+ and symmetric in the biceps however no deep tendon reflexes at Ruth patellae secondary to bilateral knee replacements Plantars: Toes are downgoing bilaterally.  Cerebellar: FNF and HKS are intact bilaterally Gait: didn't test  HOME MEDICATIONS:  Medications Prior to Admission  Medication Sig Dispense Refill  . acetaminophen (TYLENOL) 325 MG tablet Take 650 mg  by mouth every 6 (six) hours as needed for moderate pain or headache.     . pantoprazole (PROTONIX) 40 MG tablet Take 1 tablet (40 mg total) by mouth daily before breakfast. 90 tablet 3  . ALPRAZolam (XANAX) 0.5 MG tablet TAKE (1) TABLET BY MOUTH TWICE A DAY AS NEEDED. (Patient taking differently: Take 0.5 mg by mouth 2 (two) times daily as needed for anxiety. ) 60 tablet 2  . Calcium-Magnesium-Vitamin D (CITRACAL CALCIUM+D) 600-40-500 MG-MG-UNIT TB24 Take 1 tablet by mouth daily.    . citalopram (CELEXA) 40 MG tablet TAKE (1/2) TABLET BY MOUTH AT BEDTIME. (Patient taking differently: Take 20 mg by mouth at bedtime. ) 45 tablet 3  . denosumab (PROLIA) 60 MG/ML SOSY injection Inject 60 mg into the skin every 6 (six) months. 1 Syringe 1  . ferrous sulfate 325 (65 FE) MG tablet Take 325 mg by mouth daily with breakfast.    . fluticasone (FLONASE) 50 MCG/ACT nasal spray Place 2 sprays into both nostrils daily. 16 g 6  . ibuprofen (ADVIL,MOTRIN) 200 MG tablet Take 200 mg by mouth every 6 (  six) hours as needed for headache or mild pain.    Marland Kitchen levothyroxine (SYNTHROID, LEVOTHROID) 50 MCG tablet TAKE 1 TABLET BEFORE BREAKFAST. (Patient taking differently: Take 50 mcg by mouth daily before breakfast. ) 90 tablet 0  . magnesium oxide (MAG-OX) 400 MG tablet Take 400 mg by mouth every evening.     . Multiple Vitamin (MULTIVITAMIN WITH MINERALS) TABS tablet Take 1 tablet by mouth daily.    Marland Kitchen omeprazole (PRILOSEC) 20 MG capsule 1 PO 30 MINS PRIOR TO BREAKFAST. (Patient taking differently: Take 20 mg by mouth daily. ) 90 capsule 3  . ondansetron (ZOFRAN ODT) 4 MG disintegrating tablet 4mg  ODT q4 hours prn nausea/vomit (Patient taking differently: Take 4 mg by mouth every 4 (four) hours as needed for nausea or vomiting. ) 6 tablet 0  . umeclidinium-vilanterol (ANORO ELLIPTA) 62.5-25 MCG/INH AEPB Inhale 1 puff into the lungs daily. 70 each 3      HOSPITAL MEDICATIONS:  .  stroke: mapping our early stages of  recovery book   Does not apply Once  . Chlorhexidine Gluconate Cloth  6 each Topical Daily  . levothyroxine  50 mcg Oral QAC breakfast  . mouth rinse  15 mL Mouth Rinse BID  . pantoprazole (PROTONIX) IV  40 mg Intravenous QHS  . senna-docusate  1 tablet Oral BID    ALLERGIES Allergies  Allergen Reactions  . Ciprofloxacin Hcl Hives  . Iohexol Other (See Comments)     Desc: Per [facility], pt is allergic to IV contrast, no type of reaction was available. Pt cannot remember but first reacted around 15 yrs ago from an IVP. Notes were date from 2009 at urology center., Onset Date: [id number]   . Prozac [Fluoxetine Hcl] Rash  . Sulfonamide Derivatives Hives and Rash   ASSESSMENT/PLAN [name] is a 78 y.o. female with history of lung cancer, trigeminal neuralgia, on home O2, IBS, dysphagia, diastolic dysfunction, COPD, aortic aneurysm and anxiety presenting with left parietal/occipital ICH. Possible etiology is HTN, yet given Ruth cancer history will need to do MR w/wo to r/o underlying lesion and would recommend repeat imaging in 3 mo. Other ddx would be CAA.   ICH-left parietal lobar hematoma with cytotoxic edema.  Etiology indeterminate amyloid versus underlying metastasis given prior history of lung cancer  Resultant  Mixed aphasia  CT head left parietal and occipital ICH  MRI head -stable IPH, !~25cc volume; some surrounding vasogenic edema  MRA head -neg for underlying vascular process  2D Echo - neg for underlying process; left vert has some moderate stenosis noted  [employer] Virus 2 neg  LDL -n/a  HgbA1c - not done; no dm2 dx  UDS - not done  VTE prophylaxis - Contraindicated d/t bleed  Diet per SLP  No thinners or ASA prior to admit; currently not indicated  Ongoing aggressive stroke risk factor management  Therapy recommendations:  pending  Disposition:  Pending  Hypertension  Stable; Cardene PRN for goal SBP<140 . Long-term BP goal  normotensive  Other Stroke Risk Factors  Advanced age  53 Cigarette smoker   Hospital day # 1  Desiree Metzger-Cihelka, ARNP-C, ANVP-BC I have personally obtained history,examined this patient, reviewed notes, independently viewed imaging studies, participated in medical decision making and plan of care.ROS completed by me personally and pertinent positives fully documented  I have made any additions or clarifications directly to the above note.  She presented with several day history of speech difficulties secondary to left parietal parenchymal hematoma  with mild cytotoxic edema.  Etiology is indeterminate possibly amyloid angiopathy versus hemorrhage and tumor metastasis given Ruth prior history of lung cancer.  Recommend close neurological monitoring with strict blood pressure control.  Check MRI scan of the brain with contrast to rule out metastasis.  I had a telephonic discussion with the patient's Sanford and updated him about Ruth condition and answered questions.  Discussed with Dr. Kathyrn Sheriff neurosurgery and agree she is not a surgical candidate at the present time This patient is critically ill and at significant risk of neurological worsening, death and care requires constant monitoring of vital signs, hemodynamics,respiratory and cardiac monitoring, extensive review of multiple databases, frequent neurological assessment, discussion with family, other specialists and medical decision making of high complexity.I have made any additions or clarifications directly to the above note.This critical care time does not reflect procedure time, or teaching time or supervisory time of PA/NP/Med Resident etc but could involve care discussion time.  I spent 30 minutes of neurocritical care time  in the care of  this patient.      Antony Contras, MD Medical Director [facility] Pager: [phone] 03/04/2019 4:13 PM  To contact Stroke Continuity provider, please refer to  [email or web address] After hours, contact General Neurology

## 2019-03-04 NOTE — Progress Notes (Signed)
  NEUROSURGERY PROGRESS NOTE   No issues overnight. Pt cont to report HA although somewhat improved.  EXAM:  BP 116/75   Pulse 67   Temp 98.3 F (36.8 C) (Oral)   Resp 16   Ht 5\' 5"  (1.651 m)   Wt 57 kg   SpO2 97%   BMI 20.91 kg/m   Awake, alert Speech fluent, naming intact, poor repetition CN grossly intact  Good strength  IMAGING: CTH shows left parietal IPH with local mass effect. MRI without contrast also shows similar hemorrhage with mild surrounding edema. No HCP.  IMPRESSION:  78 y.o. female with spontaneous left parietal IPH and resultant aphasia. Etiology unclear. 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  PLAN: - Agree with MRI w/w/o contrast. If unrevealing, would recommend repeat MRI/MRI in 3 months - Does not have any immediate neurosurgical issues.

## 2019-03-04 NOTE — Evaluation (Addendum)
Occupational Therapy Evaluation Patient Details Name: [name] MRN: [id number] DOB: November 26, 1940 Today's Date: 03/04/2019    History of Present Illness Pt is a 78 y/o female presents with aphasia and R sided weakness. 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 MRI pending.  PMH: anxiety, COPD on home oxgyen, depression, dsyphagia, trigeminal neuralgia, aortic aneurysm.     Clinical Impression   PTA patient independent with ADLs, moblity using RW and med mgmt.  Patient admitted for above and limited by problem list below, including impaired communication (aphasic), impaired balance, generalized weakness.  Difficult to assess R sided strength/coordination due to cognition, but able to use functionally during ADLs. Patient easily frustrated due to communication. Patient currently requires min assist +2 for transfers, mod assist +2 for in room mobility, mod assist for UB ADLs, max assist for LB ADLs, and min assist for grooming standing at sink. Spoke to son to confirm home setup and PLOF, who reports they can possibly arrange 24/7 support at discharge.  Patient will benefit from continued OT services while admitted and after dc at CIR level in order to maximize independence and safety prior to dc home.  Will follow.     Follow Up Recommendations  CIR;Supervision/Assistance - 24 hour    Equipment Recommendations  3 in 1 bedside commode    Recommendations for Other Services       Precautions / Restrictions Precautions Precautions: Fall Restrictions Weight Bearing Restrictions: No      Mobility Bed Mobility Overal bed mobility: Needs Assistance Bed Mobility: Supine to Sit     Supine to sit: Min assist     General bed mobility comments: min assist for trunk support, safety and balance   Transfers Overall transfer level: Needs assistance Equipment used: 2 person hand held assist Transfers: Sit to/from Stand;Stand Pivot Transfers Sit to Stand: +2 safety/equipment;Min  assist Stand pivot transfers: +2 safety/equipment;Min assist       General transfer comment: assist to power up, safety and balance; reliant on B UE support     Balance Overall balance assessment: Needs assistance Sitting-balance support: No upper extremity supported;Feet supported Sitting balance-Leahy Scale: Fair     Standing balance support: Bilateral upper extremity supported;During functional activity Standing balance-Leahy Scale: Poor Standing balance comment: reliant on B UE and external support                           ADL either performed or assessed with clinical judgement   ADL Overall ADL's : Needs assistance/impaired     Grooming: Minimal assistance;Standing Grooming Details (indicate cue type and reason): min assist to sequence handwashing task  Upper Body Bathing: Moderate assistance;Sitting   Lower Body Bathing: Maximal assistance;+2 for safety/equipment;Sit to/from stand   Upper Body Dressing : Sitting;Moderate assistance   Lower Body Dressing: Maximal assistance;Sit to/from stand;+2 for safety/equipment   Toilet Transfer: +2 for safety/equipment;Ambulation;Regular Toilet;Minimal assistance   Toileting- Clothing Manipulation and Hygiene: Moderate assistance;Sit to/from stand Toileting - Clothing Manipulation Details (indicate cue type and reason): assist for hygiene support      Functional mobility during ADLs: Moderate assistance;+2 for physical assistance;Cueing for sequencing;Cueing for safety General ADL Comments: pt limited by cognition, aphasia, and balance     Vision   Additional Comments: further assessment required     Perception     Praxis      Pertinent Vitals/Pain Pain Assessment: Faces Faces Pain Scale: Hurts little more Pain Location: headache Pain Descriptors / Indicators: Discomfort Pain Intervention(s):  Monitored during session;Repositioned     Hand Dominance Right   Extremity/Trunk Assessment Upper Extremity  Assessment Upper Extremity Assessment: Difficult to assess due to impaired cognition;Generalized weakness   Lower Extremity Assessment Lower Extremity Assessment: Defer to PT evaluation       Communication Communication Communication: Expressive difficulties   Cognition Arousal/Alertness: Awake/alert Behavior During Therapy: Impulsive;Agitated Overall Cognitive Status: Difficult to assess(due to aphasia)                                 General Comments: pt able to verbalize her name, inconsistently following commands but overall difficult to assess due to aphaisa   General Comments       Exercises     Shoulder Instructions      Home Living Family/patient expects to be discharged to:: Private residence Living Arrangements: Alone Available Help at Discharge: Family;Available PRN/intermittently(neighbors) Type of Home: Apartment Home Access: Stairs to enter Entrance Stairs-Number of Steps: 1 Entrance Stairs-Rails: None Home Layout: One level     Bathroom Shower/Tub: Teacher, early years/pre: Handicapped height Bathroom Accessibility: Yes   Home Equipment: Environmental consultant - 4 wheels;Shower seat;Other (comment)(oxygen)          Prior Functioning/Environment Level of Independence: Independent with assistive device(s)        Comments: limited IADLS, uses RW for mobility, med mgmt         OT Problem List: Decreased strength;Decreased activity tolerance;Impaired balance (sitting and/or standing);Decreased coordination;Decreased cognition;Decreased safety awareness;Decreased knowledge of precautions;Pain      OT Treatment/Interventions: Self-care/ADL training;Therapeutic exercise;Therapeutic activities;Balance training;Patient/family education;Cognitive remediation/compensation;DME and/or AE instruction;Neuromuscular education;Visual/perceptual remediation/compensation    OT Goals(Current goals can be found in the care plan section) Acute Rehab OT  Goals Patient Stated Goal: none stated Time For Goal Achievement: 03/18/19 Potential to Achieve Goals: Good  OT Frequency: Min 2X/week   Barriers to D/C:            Co-evaluation              AM-PAC OT "6 Clicks" Daily Activity     Outcome Measure Help from another person eating meals?: A Little Help from another person taking care of personal grooming?: A Little Help from another person toileting, which includes using toliet, bedpan, or urinal?: A Lot Help from another person bathing (including washing, rinsing, drying)?: A Lot Help from another person to put on and taking off regular upper body clothing?: A Lot Help from another person to put on and taking off regular lower body clothing?: A Lot 6 Click Score: 14   End of Session Equipment Utilized During Treatment: Gait belt;Oxygen Nurse Communication: Mobility status  Activity Tolerance: Patient tolerated treatment well Patient left: in chair;with call bell/phone within reach;with chair alarm set  OT Visit Diagnosis: Other abnormalities of gait and mobility (R26.89);Muscle weakness (generalized) (M62.81);Cognitive communication deficit (R41.841);Other symptoms and signs involving the nervous system (R29.898);Other symptoms and signs involving cognitive function;History of falling (Z91.81)                Time: 7893-8101 OT Time Calculation (min): 27 min Charges:  OT General Charges $OT Visit: 1 Visit OT Evaluation $OT Eval Moderate Complexity: 1 Mod  Delight Stare, OT Acute Rehabilitation Services Pager [phone] Office [phone]   Delight Stare 03/04/2019, 11:26 AM

## 2019-03-05 ENCOUNTER — Other Ambulatory Visit: Payer: Self-pay

## 2019-03-05 DIAGNOSIS — G936 Cerebral edema: Secondary | ICD-10-CM

## 2019-03-05 NOTE — NC FL2 (Addendum)
[place] MEDICAID FL2 LEVEL OF CARE SCREENING TOOL     IDENTIFICATION  Patient Name: [name] Birthdate: 01/23/1941 Sex: female Admission Date (Current Location): 03/03/2019  [facility] and [place] Number:  [employer] and Address:  [place]. [facility] [place], [place] [address], [place], [place] [address]      Provider Number: [id number]  Attending Physician Name and Address:  Garvin Fila, MD  Relative Name and Phone Number:  Quillian Quince, son, [phone]    Current Level of Care: Hospital Recommended Level of Care: [place] Prior Approval Number:    Date Approved/Denied:   PASRR Number:    Discharge Plan: SNF    Current Diagnoses: Patient Active Problem List   Diagnosis Date Noted  . Cytotoxic brain edema ([place]) 03/04/2019  . ICH (intracerebral hemorrhage) ([place]) 03/03/2019  . Osteoporosis 05/16/2018  . S/P ORIF (open reduction internal fixation) fracture right patella 03/18/18 03/26/2018  . Cellulitis, wound, post-operative 03/22/2018  . Fracture of right patella   . Normocytic anemia   . Esophageal dysphagia 11/27/2017  . Heme positive stool 11/27/2017  . Iron deficiency anemia 12/05/2016  . SIADH (syndrome of inappropriate ADH production) ([place])   . Polypharmacy 09/14/2016  . Muscle weakness (generalized)   . Syncope 09/10/2016  . Rib pain on left side   . Hypersomnia 02/16/2016  . Acute bronchitis 02/16/2016  . Exertional dyspnea 12/20/2015  . HLD (hyperlipidemia) 11/23/2014  . Change in bowel habits 11/10/2014  . Abdominal pain, left lower quadrant 11/10/2014  . Emphysema lung ([employer]) 11/08/2014  . Helicobacter pylori gastritis 07/30/2014  . Acute on chronic respiratory failure with hypoxia ([place]) 04/10/2014  . Wrist fracture 04/09/2014  . 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 T12 burst fracture ([place]) 01/13/2014  . Constipation 12/14/2013  . Acute respiratory  failure with hypoxia ([place]) 12/14/2013  . COPD (chronic obstructive pulmonary disease) ([place]) 12/14/2013  . Compression fracture of thoracic vertebra ([place]) 12/14/2013  . Hypoxia 12/13/2013  . Compression fracture 12/13/2013  . Diastolic dysfunction   . 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 Dyspepsia 02/11/2012  . Aneurysm ([place]) 02/11/2012  . Trigeminal neuralgia   . Epigastric pain 02/21/2011  . Dysphagia 02/21/2011  . Adenocarcinoma of lung ([place]) 12/07/2010  . HELICOBACTER PYLORI GASTRITIS, HX OF 12/30/2009  . CONTRAST DYE ALLERGY 12/30/2009    Orientation RESPIRATION BLADDER Height & Weight     Self, Time, Situation, Place  O2(Nasla cannula 3L) Continent Weight: 125 lb 10.6 oz (57 kg) Height:  5\' 5"  (165.1 cm)  BEHAVIORAL SYMPTOMS/MOOD NEUROLOGICAL BOWEL NUTRITION STATUS      Continent Diet(Please see DC Summary)  AMBULATORY STATUS COMMUNICATION OF NEEDS Skin   Limited Assist Verbally Normal                       Personal Care Assistance Level of Assistance  Bathing, Feeding, Dressing Bathing Assistance: Limited assistance Feeding assistance: Independent Dressing Assistance: Limited assistance     Functional Limitations Info  Sight, Hearing, Speech Sight Info: Adequate Hearing Info: Adequate Speech Info: Adequate    SPECIAL CARE FACTORS FREQUENCY  PT (By licensed PT), OT (By licensed OT)     PT Frequency: 5x/week OT Frequency: 3x/week            Contractures Contractures Info: Not present    Additional Factors Info  Code Status, Allergies Code Status  Info: Full Allergies Info: Ciprofloxacin Hcl, Iohexol, Prozac Fluoxetine Hcl, Sulfonamide Derivatives           Current Medications (03/05/2019):  This is the current hospital active medication list Current Facility-Administered Medications  Medication Dose Route Frequency Provider Last Rate Last Dose  .  stroke: mapping our early stages of  recovery book   Does not apply Once Marliss Coots, PA-C      . 0.9 %  sodium chloride infusion   Intravenous Continuous Marliss Coots, PA-C   Stopped at 03/04/19 6578  . acetaminophen (TYLENOL) tablet 650 mg  650 mg Oral Q4H PRN Kerney Elbe, MD   650 mg at 03/04/19 1445   Or  . acetaminophen (TYLENOL) solution 650 mg  650 mg Per Tube Q4H PRN Kerney Elbe, MD       Or  . acetaminophen (TYLENOL) suppository 650 mg  650 mg Rectal Q4H PRN Kerney Elbe, MD      . ALPRAZolam Duanne Moron) tablet 0.25 mg  0.25 mg Oral BID PRN Kerney Elbe, MD      . Chlorhexidine Gluconate Cloth 2 % PADS 6 each  6 each Topical Daily Kerney Elbe, MD   6 each at 03/04/19 [phone]  . levothyroxine (SYNTHROID) tablet 50 mcg  50 mcg Oral QAC breakfast Marliss Coots, PA-C   50 mcg at 03/05/19 2952  . MEDLINE mouth rinse  15 mL Mouth Rinse BID Greta Doom, MD   15 mL at 03/05/19 0911  . pantoprazole (PROTONIX) injection 40 mg  40 mg Intravenous QHS Marliss Coots, PA-C   40 mg at 03/05/19 0053  . promethazine (PHENERGAN) injection 12.5 mg  12.5 mg Intravenous Q8H PRN Kerney Elbe, MD   12.5 mg at 03/03/19 1920  . senna-docusate (Senokot-S) tablet 1 tablet  1 tablet Oral BID Marliss Coots, PA-C   1 tablet at 03/05/19 8413  . senna-docusate (Senokot-S) tablet 1 tablet  1 tablet Oral QHS PRN Garvin Fila, MD         Discharge Medications: Please see discharge summary for a list of discharge medications.  Relevant Imaging Results:  Relevant Lab Results:   Additional Information SSN: [id number]   COVID negative on 03/03/19  Benard Halsted, LCSW  I have personally obtained history,examined this patient, reviewed notes, independently viewed imaging studies, participated in medical decision making and plan of care.ROS completed by me personally and pertinent positives fully documented  I have made any additions or clarifications directly to the above note. Agree with note above.   Antony Contras, MD Medical  Director [facility] Pager: [phone] 03/06/2019 1:32 PM

## 2019-03-05 NOTE — Progress Notes (Signed)
  Speech Language Pathology Treatment: Cognitive-Linquistic(Aphasia )  Patient Details Name: [name] MRN: [id number] DOB: October 08, 1941 Today's Date: 03/05/2019 Time: 5003-7048 SLP Time Calculation (min) (ACUTE ONLY): 32 min  Assessment / Plan / Recommendation Clinical Impression  Pt was seen for aphasia treatment and was cooperative during the [place]. Her language skills have improved compared to yesterday with a notable reduction in perseveration compared to yesterday but semantic and phonemic paraphasia were demonstrated. She provided 13-19 items per concrete category during divergent naming tasks when min. cues. She achieved 100% accuracy with responsive naming and simple yes/no questions. With complex yes/no questions she achieved 80% accuracy increasing to 90% accuracy with min. cues. She completed 2-step commands with 60% accuracy increasing to 100% accuracy with repetition and cues. SLP will continue to follow pt.    HPI HPI: Pt is a 78 y.o. female with history of trigeminal neuralgia, on home O2, IBS, dysphagia, diastolic dysfunction, COPD, aortic aneurysm and anxiety.  Patient presented to [facility] ED as a code stroke secondary to aphasia and right-sided weakness. She was transferred to [facility] for further care. MRI of the brain revealed acute intraparenchymal hemorrhage centered at the left parietal convexity, estimated volume 25 cc. Surrounding vasogenic edema with mild regional mass effect without midline shift.      SLP Plan  Continue with current plan of care       Recommendations                   Follow up Recommendations: Inpatient Rehab SLP Visit Diagnosis: Aphasia (R47.01) Plan: Continue with current plan of care       Areal Cochrane I. [name], [place], [place] Office number [phone] Pager [place] 03/05/2019, 9:54 AM

## 2019-03-05 NOTE — Progress Notes (Addendum)
Physical Therapy Treatment Patient Details Name: [name] MRN: [id number] DOB: 1941-03-26 Today's Date: 03/05/2019    History of Present Illness Pt is a 78 y/o female presents with aphasia and R sided weakness. 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 MRI pending.  PMH: anxiety, COPD on home oxgyen, depression, dsyphagia, trigeminal neuralgia, aortic aneurysm.      PT Comments    Pt with notable improvements today in communication, balance, and activity tolerance. Ambulating 200 feet with walker and min guard assist. Pt reporting RLE numbness, decreased light touch noted. Continues with balance impairments, decreased cognition and decreased endurance. Based on deficits and decreased caregiver support, continue to recommend post acute rehab. Will continue to reassess.    Follow Up Recommendations  SNF (denied from CIR)     Equipment Recommendations  None recommended by PT    Recommendations for Other Services       Precautions / Restrictions Precautions Precautions: Fall Restrictions Weight Bearing Restrictions: No    Mobility  Bed Mobility Overal bed mobility: Needs Assistance Bed Mobility: Supine to Sit     Supine to sit: Min guard     General bed mobility comments: Min guard for safety  Transfers Overall transfer level: Needs assistance Equipment used: Rolling walker (2 wheeled) Transfers: Sit to/from Stand Sit to Stand: Min guard            Ambulation/Gait Ambulation/Gait assistance: Min guard Gait Distance (Feet): 200 Feet Assistive device: Rolling walker (2 wheeled) Gait Pattern/deviations: Step-through pattern;Decreased stride length Gait velocity: decreased   General Gait Details: Cues for looking up, scapular depression. Requiring less assistance with use of walker   Stairs             Wheelchair Mobility    Modified Rankin (Stroke Patients Only) Modified Rankin (Stroke Patients Only) Pre-Morbid Rankin Score: Slight  disability Modified Rankin: Moderately severe disability     Balance Overall balance assessment: Needs assistance Sitting-balance support: No upper extremity supported;Feet supported Sitting balance-Leahy Scale: Good     Standing balance support: Bilateral upper extremity supported;During functional activity Standing balance-Leahy Scale: Poor Standing balance comment: reliant on B UE and external support                            Cognition Arousal/Alertness: Awake/alert Behavior During Therapy: Impulsive;Agitated Overall Cognitive Status: Impaired/Different from baseline Area of Impairment: Problem solving;Following commands                       Following Commands: Follows multi-step commands inconsistently     Problem Solving: Requires verbal cues;Requires tactile cues General Comments: Improvement in ability to translate needs today, no perseveration noted      Exercises      General Comments  BP 134/69, HR 86-101 bpm, SpO2 91-98% on 3L O2      Pertinent Vitals/Pain Pain Assessment: Faces Faces Pain Scale: No hurt    Home Living                      Prior Function            PT Goals (current goals can now be found in the care plan section) Acute Rehab PT Goals Patient Stated Goal: none stated PT Goal Formulation: With patient Time For Goal Achievement: 03/18/19 Potential to Achieve Goals: Good Progress towards PT goals: Progressing toward goals    Frequency    Min 4X/week  PT Plan Discharge plan needs to be updated    Co-evaluation              AM-PAC PT "6 Clicks" Mobility   Outcome Measure  Help needed turning from your back to your side while in a flat bed without using bedrails?: None Help needed moving from lying on your back to sitting on the side of a flat bed without using bedrails?: A Little Help needed moving to and from a bed to a chair (including a wheelchair)?: A Little Help needed  standing up from a chair using your arms (e.g., wheelchair or bedside chair)?: A Little Help needed to walk in hospital room?: A Little Help needed climbing 3-5 steps with a railing? : A Lot 6 Click Score: 18    End of Session Equipment Utilized During Treatment: Gait belt;Oxygen Activity Tolerance: Patient tolerated treatment well Patient left: in chair;with call bell/phone within reach;with chair alarm set Nurse Communication: Mobility status PT Visit Diagnosis: Unsteadiness on feet (R26.81);Difficulty in walking, not elsewhere classified (R26.2)     Time: 8891-6945 PT Time Calculation (min) (ACUTE ONLY): 25 min  Charges:  $Gait Training: 8-22 mins $Therapeutic Activity: 8-22 mins                     [name], PT, DPT Acute Rehabilitation Services Pager [phone] Office [phone]    Willy Eddy 03/05/2019, 3:21 PM

## 2019-03-05 NOTE — Progress Notes (Addendum)
STROKE TEAM PROGRESS NOTE     SUBJECTIVE (INTERVAL HISTORY) Her RN is at the bedside. Neuro exam stable, much improved aphasia.Blood pressure adequately controlled even without medications. Ready for transfer to neuro floor.  MRI scan of the brain shows at least 5 intraparenchymal enhancing metastatic lesion the largest 1 located in the left posterior parietal lobe where there is associated hemorrhage OBJECTIVE Vitals:   03/05/19 1400 03/05/19 1500 03/05/19 1623 03/05/19 1629  BP: 103/66 103/89 111/77 111/77  Pulse: 88 78 72 72  Resp: (!) 9 20 18 16   Temp:    98 F (36.7 C)  TempSrc:    Oral  SpO2: 96% 94%  95%  Weight:      Height:        CBC:  Recent Labs  Lab 03/03/19 1352  WBC 9.8  NEUTROABS 8.8*  HGB 13.0  HCT 39.5  MCV 92.1  PLT 662    Basic Metabolic Panel:  Recent Labs  Lab 03/03/19 1352  NA 135  K 3.9  CL 95*  CO2 28  GLUCOSE 115*  BUN 11  CREATININE 0.53  CALCIUM 9.5    Lipid Panel:     Component Value Date/Time   CHOL 217 (H) 10/25/2014 1127   TRIG 86 10/25/2014 1127   HDL 73 10/25/2014 1127   CHOLHDL 3.0 10/25/2014 1127   VLDL 17 10/25/2014 1127   LDLCALC 127 (H) 10/25/2014 1127   HgbA1c: No results found for: HGBA1C Urine Drug Screen: No results found for: LABOPIA, COCAINSCRNUR, LABBENZ, AMPHETMU, THCU, LABBARB  Alcohol Level     Component Value Date/Time   ETH <10 03/03/2019 1352    IMAGING   Ct Head Wo Contrast  Result Date: 03/04/2019 CLINICAL DATA:  Intracranial hemorrhage follow-up EXAM: CT HEAD WITHOUT CONTRAST TECHNIQUE: Contiguous axial images were obtained from the base of the skull through the vertex without intravenous contrast. COMPARISON:  Brain MRI from yesterday FINDINGS: Brain: Up to 4.2 Cm (craniocaudal) parenchymal hematoma in the left parietal lobe that is unchanged in extent when compared to brain MRI yesterday. There is a rim of edema that is also stable. Local swelling without midline shift. No hydrocephalus or  extra-axial collection. No visible infarct Vascular: Atherosclerotic calcification Skull: No acute or aggressive finding Sinuses/Orbits: Bilateral cataract resection. IMPRESSION: Size stable left parietal hematoma with edema. No new abnormality or midline shift. Electronically Signed   By: Monte Fantasia M.D.   On: 03/04/2019 05:25   Mr [name] Head Wo Contrast  Result Date: 03/04/2019 CLINICAL DATA:  Initial evaluation for acute aphasia, right-sided weakness, confusion. EXAM: MRI HEAD WITHOUT CONTRAST MRA HEAD WITHOUT CONTRAST MRA NECK WITHOUT AND WITH CONTRAST TECHNIQUE: Multiplanar, multiecho pulse sequences of the brain and surrounding structures were obtained without intravenous contrast. Angiographic images of the Circle of Willis were obtained using MRA technique without intravenous contrast. Angiographic images of the neck were obtained using MRA technique without and with intravenous contrast. Carotid stenosis measurements (when applicable) are obtained utilizing NASCET criteria, using the distal internal carotid diameter as the denominator. CONTRAST:  5.7 cc Gadavist. COMPARISON:  Prior CT from earlier the same day. FINDINGS: MRI HEAD FINDINGS Cerebral volume within normal limits for age. Minimal T2/FLAIR hyperintensity noted within the periventricular white matter, nonspecific, felt to be within normal limits for age. Previously identified intraparenchymal hemorrhage centered at the left parietal lobe again seen, measuring 2.9 x 4.0 x 4.3 cm (estimated volume 25 cc). Surrounding vasogenic edema with mild regional mass effect. No midline shift. No  intraventricular or extra-axial extension. No discernible underlying mass lesion or other abnormality. No significant dot burden to suggest cerebral amyloid angiopathy. No findings to suggest overlying trauma. Etiology unclear. No other abnormal foci of restricted diffusion to suggest acute or subacute ischemia. Gray-white matter differentiation otherwise  maintained. No encephalomalacia to suggest chronic cortical infarction. No other acute or chronic intracranial hemorrhage. No mass lesion or midline shift. No hydrocephalus. No extra-axial fluid collection. Pituitary gland suprasellar region normal. Midline structures intact. Major intracranial vascular flow voids maintained. Craniocervical junction normal. No focal marrow replacing lesion. Scalp soft tissues unremarkable. Patient status post bilateral ocular lens replacement. Paranasal sinuses are clear. Small left mastoid effusion noted, of doubtful significance. MRA HEAD FINDINGS ANTERIOR CIRCULATION: Examination moderately degraded by motion artifact. Distal cervical segments of the internal carotid arteries are patent with symmetric antegrade flow. Distal cervical left ICA tortuous. Petrous, cavernous, and supraclinoid segments patent without appreciable stenosis. A1 segments patent bilaterally. Anterior cerebral arteries patent to their distal aspects without definite stenosis. Anterior communicating artery not well assessed on this motion degraded exam. No M1 occlusion. Possible short-segment mild distal left M1 stenosis noted (series 1052, image 11). Normal MCA bifurcations. Distal MCA branches well perfused and grossly symmetric. No vascular abnormality seen underlying the partially visualized left parietal hematoma. POSTERIOR CIRCULATION: Vertebral arteries patent to the vertebrobasilar junction without stenosis. Vertebral arteries largely code dominant. Basilar widely patent to its distal aspect. Superior cerebral arteries patent bilaterally. Both of the posterior cerebral arteries primarily supplied via the basilar and are well perfused to their distal aspects. No definite flow-limiting stenosis. Prominent left posterior communicating artery noted. No visible aneurysm or other vascular abnormality on this motion degraded exam. MRA NECK FINDINGS Examination technically limited by motion artifact.  Visualized aortic arch of normal caliber with normal 3 vessel morphology. No flow-limiting stenosis about the origin of the great vessels. Visualized subclavian arteries widely patent. Right common carotid artery widely patent from its origin to the bifurcation. Mild atheromatous irregularity about the right bifurcation without hemodynamically significant stenosis. Right ICA patent distally to the circle-of-Willis without stenosis or occlusion. Left common carotid artery patent from its origin to the bifurcation without stenosis. No significant atheromatous narrowing about the left bifurcation. Left ICA tortuous but patent to the circle-of-Willis without stenosis or occlusion. Both of the vertebral arteries arise from the subclavian arteries. Vertebral arteries largely code dominant. Probable short-segment moderate stenosis at the origin of the left vertebral artery (series 1084, image 14). Vertebral arteries otherwise widely patent within the neck without stenosis or occlusion. IMPRESSION: MRI HEAD IMPRESSION: 1. Stable size of acute intraparenchymal hemorrhage centered at the left parietal convexity, estimated volume 25 cc. Surrounding vasogenic edema with mild regional mass effect without midline shift. No identifiable etiology identified on this exam. 2. Otherwise normal brain MRI for age. MRA HEAD IMPRESSION: 1. Technically limited exam due to moderate motion degradation. 2. Grossly negative intracranial MRA. No large vessel occlusion, hemodynamically significant stenosis, or other acute vascular process. No vascular abnormality seen underlying the left parietal hemorrhage. MRA NECK IMPRESSION: 1. Wide patency of the carotid artery systems bilaterally within the neck. 2. Short-segment moderate stenosis at the origin of the left vertebral artery. Vertebral arteries otherwise widely patent within the neck. Vertebral arteries are co dominant. Electronically Signed   By: Jeannine Boga M.D.   On: 03/04/2019  01:21   Mr [name] Neck W Wo Contrast  Result Date: 03/04/2019 CLINICAL DATA:  Initial evaluation for acute aphasia, right-sided weakness, confusion. EXAM: MRI  HEAD WITHOUT CONTRAST MRA HEAD WITHOUT CONTRAST MRA NECK WITHOUT AND WITH CONTRAST TECHNIQUE: Multiplanar, multiecho pulse sequences of the brain and surrounding structures were obtained without intravenous contrast. Angiographic images of the Circle of Willis were obtained using MRA technique without intravenous contrast. Angiographic images of the neck were obtained using MRA technique without and with intravenous contrast. Carotid stenosis measurements (when applicable) are obtained utilizing NASCET criteria, using the distal internal carotid diameter as the denominator. CONTRAST:  5.7 cc Gadavist. COMPARISON:  Prior CT from earlier the same day. FINDINGS: MRI HEAD FINDINGS Cerebral volume within normal limits for age. Minimal T2/FLAIR hyperintensity noted within the periventricular white matter, nonspecific, felt to be within normal limits for age. Previously identified intraparenchymal hemorrhage centered at the left parietal lobe again seen, measuring 2.9 x 4.0 x 4.3 cm (estimated volume 25 cc). Surrounding vasogenic edema with mild regional mass effect. No midline shift. No intraventricular or extra-axial extension. No discernible underlying mass lesion or other abnormality. No significant dot burden to suggest cerebral amyloid angiopathy. No findings to suggest overlying trauma. Etiology unclear. No other abnormal foci of restricted diffusion to suggest acute or subacute ischemia. Gray-white matter differentiation otherwise maintained. No encephalomalacia to suggest chronic cortical infarction. No other acute or chronic intracranial hemorrhage. No mass lesion or midline shift. No hydrocephalus. No extra-axial fluid collection. Pituitary gland suprasellar region normal. Midline structures intact. Major intracranial vascular flow voids maintained.  Craniocervical junction normal. No focal marrow replacing lesion. Scalp soft tissues unremarkable. Patient status post bilateral ocular lens replacement. Paranasal sinuses are clear. Small left mastoid effusion noted, of doubtful significance. MRA HEAD FINDINGS ANTERIOR CIRCULATION: Examination moderately degraded by motion artifact. Distal cervical segments of the internal carotid arteries are patent with symmetric antegrade flow. Distal cervical left ICA tortuous. Petrous, cavernous, and supraclinoid segments patent without appreciable stenosis. A1 segments patent bilaterally. Anterior cerebral arteries patent to their distal aspects without definite stenosis. Anterior communicating artery not well assessed on this motion degraded exam. No M1 occlusion. Possible short-segment mild distal left M1 stenosis noted (series 1052, image 11). Normal MCA bifurcations. Distal MCA branches well perfused and grossly symmetric. No vascular abnormality seen underlying the partially visualized left parietal hematoma. POSTERIOR CIRCULATION: Vertebral arteries patent to the vertebrobasilar junction without stenosis. Vertebral arteries largely code dominant. Basilar widely patent to its distal aspect. Superior cerebral arteries patent bilaterally. Both of the posterior cerebral arteries primarily supplied via the basilar and are well perfused to their distal aspects. No definite flow-limiting stenosis. Prominent left posterior communicating artery noted. No visible aneurysm or other vascular abnormality on this motion degraded exam. MRA NECK FINDINGS Examination technically limited by motion artifact. Visualized aortic arch of normal caliber with normal 3 vessel morphology. No flow-limiting stenosis about the origin of the great vessels. Visualized subclavian arteries widely patent. Right common carotid artery widely patent from its origin to the bifurcation. Mild atheromatous irregularity about the right bifurcation without  hemodynamically significant stenosis. Right ICA patent distally to the circle-of-Willis without stenosis or occlusion. Left common carotid artery patent from its origin to the bifurcation without stenosis. No significant atheromatous narrowing about the left bifurcation. Left ICA tortuous but patent to the circle-of-Willis without stenosis or occlusion. Both of the vertebral arteries arise from the subclavian arteries. Vertebral arteries largely code dominant. Probable short-segment moderate stenosis at the origin of the left vertebral artery (series 1084, image 14). Vertebral arteries otherwise widely patent within the neck without stenosis or occlusion. IMPRESSION: MRI HEAD IMPRESSION: 1. Stable size of  acute intraparenchymal hemorrhage centered at the left parietal convexity, estimated volume 25 cc. Surrounding vasogenic edema with mild regional mass effect without midline shift. No identifiable etiology identified on this exam. 2. Otherwise normal brain MRI for age. MRA HEAD IMPRESSION: 1. Technically limited exam due to moderate motion degradation. 2. Grossly negative intracranial MRA. No large vessel occlusion, hemodynamically significant stenosis, or other acute vascular process. No vascular abnormality seen underlying the left parietal hemorrhage. MRA NECK IMPRESSION: 1. Wide patency of the carotid artery systems bilaterally within the neck. 2. Short-segment moderate stenosis at the origin of the left vertebral artery. Vertebral arteries otherwise widely patent within the neck. Vertebral arteries are co dominant. Electronically Signed   By: Jeannine Boga M.D.   On: 03/04/2019 01:21   Mr Brain Wo Contrast  Result Date: 03/04/2019 CLINICAL DATA:  Initial evaluation for acute aphasia, right-sided weakness, confusion. EXAM: MRI HEAD WITHOUT CONTRAST MRA HEAD WITHOUT CONTRAST MRA NECK WITHOUT AND WITH CONTRAST TECHNIQUE: Multiplanar, multiecho pulse sequences of the brain and surrounding structures  were obtained without intravenous contrast. Angiographic images of the Circle of Willis were obtained using MRA technique without intravenous contrast. Angiographic images of the neck were obtained using MRA technique without and with intravenous contrast. Carotid stenosis measurements (when applicable) are obtained utilizing NASCET criteria, using the distal internal carotid diameter as the denominator. CONTRAST:  5.7 cc Gadavist. COMPARISON:  Prior CT from earlier the same day. FINDINGS: MRI HEAD FINDINGS Cerebral volume within normal limits for age. Minimal T2/FLAIR hyperintensity noted within the periventricular white matter, nonspecific, felt to be within normal limits for age. Previously identified intraparenchymal hemorrhage centered at the left parietal lobe again seen, measuring 2.9 x 4.0 x 4.3 cm (estimated volume 25 cc). Surrounding vasogenic edema with mild regional mass effect. No midline shift. No intraventricular or extra-axial extension. No discernible underlying mass lesion or other abnormality. No significant dot burden to suggest cerebral amyloid angiopathy. No findings to suggest overlying trauma. Etiology unclear. No other abnormal foci of restricted diffusion to suggest acute or subacute ischemia. Gray-white matter differentiation otherwise maintained. No encephalomalacia to suggest chronic cortical infarction. No other acute or chronic intracranial hemorrhage. No mass lesion or midline shift. No hydrocephalus. No extra-axial fluid collection. Pituitary gland suprasellar region normal. Midline structures intact. Major intracranial vascular flow voids maintained. Craniocervical junction normal. No focal marrow replacing lesion. Scalp soft tissues unremarkable. Patient status post bilateral ocular lens replacement. Paranasal sinuses are clear. Small left mastoid effusion noted, of doubtful significance. MRA HEAD FINDINGS ANTERIOR CIRCULATION: Examination moderately degraded by motion artifact.  Distal cervical segments of the internal carotid arteries are patent with symmetric antegrade flow. Distal cervical left ICA tortuous. Petrous, cavernous, and supraclinoid segments patent without appreciable stenosis. A1 segments patent bilaterally. Anterior cerebral arteries patent to their distal aspects without definite stenosis. Anterior communicating artery not well assessed on this motion degraded exam. No M1 occlusion. Possible short-segment mild distal left M1 stenosis noted (series 1052, image 11). Normal MCA bifurcations. Distal MCA branches well perfused and grossly symmetric. No vascular abnormality seen underlying the partially visualized left parietal hematoma. POSTERIOR CIRCULATION: Vertebral arteries patent to the vertebrobasilar junction without stenosis. Vertebral arteries largely code dominant. Basilar widely patent to its distal aspect. Superior cerebral arteries patent bilaterally. Both of the posterior cerebral arteries primarily supplied via the basilar and are well perfused to their distal aspects. No definite flow-limiting stenosis. Prominent left posterior communicating artery noted. No visible aneurysm or other vascular abnormality on this motion degraded exam.  MRA NECK FINDINGS Examination technically limited by motion artifact. Visualized aortic arch of normal caliber with normal 3 vessel morphology. No flow-limiting stenosis about the origin of the great vessels. Visualized subclavian arteries widely patent. Right common carotid artery widely patent from its origin to the bifurcation. Mild atheromatous irregularity about the right bifurcation without hemodynamically significant stenosis. Right ICA patent distally to the circle-of-Willis without stenosis or occlusion. Left common carotid artery patent from its origin to the bifurcation without stenosis. No significant atheromatous narrowing about the left bifurcation. Left ICA tortuous but patent to the circle-of-Willis without stenosis  or occlusion. Both of the vertebral arteries arise from the subclavian arteries. Vertebral arteries largely code dominant. Probable short-segment moderate stenosis at the origin of the left vertebral artery (series 1084, image 14). Vertebral arteries otherwise widely patent within the neck without stenosis or occlusion. IMPRESSION: MRI HEAD IMPRESSION: 1. Stable size of acute intraparenchymal hemorrhage centered at the left parietal convexity, estimated volume 25 cc. Surrounding vasogenic edema with mild regional mass effect without midline shift. No identifiable etiology identified on this exam. 2. Otherwise normal brain MRI for age. MRA HEAD IMPRESSION: 1. Technically limited exam due to moderate motion degradation. 2. Grossly negative intracranial MRA. No large vessel occlusion, hemodynamically significant stenosis, or other acute vascular process. No vascular abnormality seen underlying the left parietal hemorrhage. MRA NECK IMPRESSION: 1. Wide patency of the carotid artery systems bilaterally within the neck. 2. Short-segment moderate stenosis at the origin of the left vertebral artery. Vertebral arteries otherwise widely patent within the neck. Vertebral arteries are co dominant. Electronically Signed   By: Jeannine Boga M.D.   On: 03/04/2019 01:21   Mr Brain W Contrast  Result Date: 03/04/2019 CLINICAL DATA:  Intracranial hemorrhage.  History of lung cancer. EXAM: MRI HEAD WITH CONTRAST TECHNIQUE: Multiplanar, multiecho pulse sequences of the brain and surrounding structures were obtained with intravenous contrast. CONTRAST:  6 mL Gadavist COMPARISON:  Noncontrast MRI of the brain 03/03/2019 FINDINGS: Postcontrast T1-weighted imaging of the brain shows multiple intraparenchymal contrast-enhancing lesions. 1. Left cerebellum 5 x 5 mm, 2:40 2. Right temporal lobe 2 mm, 2:51 3. Peripheral contrast enhancement at the site of the known intraparenchymal hematoma in the posterior left parietal lobe,  altogether measuring 3.7 x 2.9 cm, 2:87 4. Posterior left insula 5 x 5 mm, 2:91 5. Left frontal precentral gyrus 6 x 6 mm, 2:124 No contrast-enhancing lesions of the calvarium or extracranial soft tissues. IMPRESSION: 1. Five intraparenchymal metastatic lesions, the largest of which is located in the posterior left parietal lobe where the associated hemorrhage makes accurate measurement difficult. 2. Unchanged size of hematoma. Electronically Signed   By: Ulyses Jarred M.D.   On: 03/04/2019 17:57   Transthoracic Echocardiogram  02/22/2019 normal ejection fraction 60 to 65%.  No wall motion abnormalities.    PHYSICAL EXAM Blood pressure 111/77, pulse 72, temperature 98 F (36.7 C), temperature source Oral, resp. rate 16, height 5\' 5"  (1.651 m), weight 57 kg, SpO2 95 %.  Constitutional: Appears well-developed and well-nourished.  Elderly Caucasian lady not in distress Eyes: No scleral injection HENT: No OP obstrucion Head: Normocephalic.  Cardiovascular: Normal rate and regular rhythm.  Respiratory: Effort normal, non-labored breathing GI: Soft.  No distension. There is no tenderness.  Skin: WDI  Neuro: Mental Status: Patient is awake, aphasia appears much improved.  Able to orient to name/dob/age. Says she is at the [place], but has trouble with date, even when pointing to calendar on wall. Follows commands briskly  today. Speech is more fluent, but still with paraphasic errors.  Able to name objects more reliably today Cranial Nerves: II: Able to count fingers in all quadrants   III,IV, VI: EOMI without ptosis or diplopia. Pupils equal, round and reactive to light V: Facial sensation is symmetric VII: Facial movement is symmetric.  VIII: hearing is intact to voice X: Palate elevates symmetrically XI: Shoulder shrug is symmetric. XII: tongue is midline Motor:  moving all extremities with 5/5 strength.  Diminished fine finger movements on the right still noted somewhat Sensory: Intact  to LT throughout Deep Tendon Reflexes: 2+ and symmetric in the biceps however no deep tendon reflexes at her patellae secondary to bilateral knee replacements Plantars: Toes are downgoing bilaterally.  Cerebellar: FNF and HKS are intact bilaterally Gait: didn't test  HOME MEDICATIONS:  Medications Prior to Admission  Medication Sig Dispense Refill  . acetaminophen (TYLENOL) 325 MG tablet Take 650 mg by mouth every 6 (six) hours as needed for moderate pain or headache.     . ALPRAZolam (XANAX) 0.5 MG tablet TAKE (1) TABLET BY MOUTH TWICE A DAY AS NEEDED. (Patient taking differently: Take 0.5 mg by mouth 2 (two) times daily as needed for anxiety. ) 60 tablet 2  . citalopram (CELEXA) 40 MG tablet TAKE (1/2) TABLET BY MOUTH AT BEDTIME. (Patient taking differently: Take 20 mg by mouth at bedtime. ) 45 tablet 3  . fluticasone (FLONASE) 50 MCG/ACT nasal spray Place 2 sprays into both nostrils daily. 16 g 6  . levothyroxine (SYNTHROID, LEVOTHROID) 50 MCG tablet TAKE 1 TABLET BEFORE BREAKFAST. (Patient taking differently: Take 50 mcg by mouth daily before breakfast. ) 90 tablet 0  . pantoprazole (PROTONIX) 40 MG tablet Take 1 tablet (40 mg total) by mouth daily before breakfast. 90 tablet 3  . umeclidinium-vilanterol (ANORO ELLIPTA) 62.5-25 MCG/INH AEPB Inhale 1 puff into the lungs daily. 90 each 3  . Calcium-Magnesium-Vitamin D (CITRACAL CALCIUM+D) 600-40-500 MG-MG-UNIT TB24 Take 1 tablet by mouth daily.    Marland Kitchen denosumab (PROLIA) 60 MG/ML SOSY injection Inject 60 mg into the skin every 6 (six) months. 1 Syringe 1  . ferrous sulfate 325 (65 FE) MG tablet Take 325 mg by mouth daily with breakfast.    . ibuprofen (ADVIL,MOTRIN) 200 MG tablet Take 200 mg by mouth every 6 (six) hours as needed for headache or mild pain.    . magnesium oxide (MAG-OX) 400 MG tablet Take 400 mg by mouth every evening.     . Multiple Vitamin (MULTIVITAMIN WITH MINERALS) TABS tablet Take 1 tablet by mouth daily.    Marland Kitchen omeprazole  (PRILOSEC) 20 MG capsule 1 PO 30 MINS PRIOR TO BREAKFAST. (Patient taking differently: Take 20 mg by mouth daily. ) 90 capsule 3  . ondansetron (ZOFRAN ODT) 4 MG disintegrating tablet 4mg  ODT q4 hours prn nausea/vomit (Patient taking differently: Take 4 mg by mouth every 4 (four) hours as needed for nausea or vomiting. ) 6 tablet 0      HOSPITAL MEDICATIONS:  .  stroke: mapping our early stages of recovery book   Does not apply Once  . Chlorhexidine Gluconate Cloth  6 each Topical Daily  . levothyroxine  50 mcg Oral QAC breakfast  . mouth rinse  15 mL Mouth Rinse BID  . pantoprazole (PROTONIX) IV  40 mg Intravenous QHS  . senna-docusate  1 tablet Oral BID    ALLERGIES Allergies  Allergen Reactions  . Ciprofloxacin Hcl Hives  . Iohexol Other (See Comments)  Desc: Per [facility], pt is allergic to IV contrast, no type of reaction was available. Pt cannot remember but first reacted around 15 yrs ago from an IVP. Notes were date from 2009 at urology center., Onset Date: [id number]   . Prozac [Fluoxetine Hcl] Rash  . Sulfonamide Derivatives Hives and Rash   ASSESSMENT/PLAN Ms. [name] is a 78 y.o. female with history of lung cancer, trigeminal neuralgia, on home O2, IBS, dysphagia, diastolic dysfunction, COPD, aortic aneurysm and anxiety presenting with left parietal/occipital ICH. Possible etiology is HTN, yet given her cancer history MRI with contrast shows multiple enhancing metastatic lesions with hemorrhage into one such lesion as a cause of her intracerebral hemorrhage.   ICH-left parietal lobar hematoma with cytotoxic edema.  Etiology indeterminate amyloid versus underlying metastasis given prior history of lung cancer  Resultant  Mixed aphasia  CT head left parietal and occipital ICH  MRI head -stable IPH ~25cc volume; some surrounding vasogenic edema  MRI w/gad- Multiple underlying metastatic lesions.   MRA head -neg for underlying vascular process  MRI scan of  the brain with contrast : At least 5 enhancing metastatic lesions with the largest one in the left posterior parietal at the location of the hemorrhage  2D Echo - neg for underlying process; left vert has some moderate stenosis noted  [employer] Virus 2 neg  LDL -n/a  HgbA1c - not done; no dm2 dx  UDS - not done  VTE prophylaxis - Contraindicated d/t bleed  Diet per SLP  No thinners or ASA prior to admit; currently not indicated  Ongoing aggressive stroke risk factor management  Therapy recommendations:  CIR currently per rehab team  Disposition:  Pending  Hypertension  Stable . Long-term BP goal normotensive  Other Stroke Risk Factors  Advanced age  78 Cigarette smoker  Prev h/o lung cancer.   Neuro Oncology consult d/w Dr Mickeal Skinner. He will see her tomorrow    Hospital day # 2   Lenea Bywater Metzger-Cihelka, ARNP-C, ANVP-B Pager: [phone] The patient is neurologically improving with aphasia improved today but unfortunately the MRI scan of the brain with contrast shows multiple metastatic lesions.  We will consult oncology to complete cancer work-up and treatment.  I spoke to the patient's son Aaron Edelman over the phone and informed him about the MRI findings and answered questions.  Transfer the patient to the neurology floor bed.I have spent a total of  35  minutes with the patient reviewing hospital notes,  test results, labs and examining the patient as well as establishing an assessment and plan that was discussed personally with the patient.  > 50% of time was spent in direct patient care.   Antony Contras, MD    To contact Stroke Continuity provider, please refer to [email or web address] After hours, contact General Neurology

## 2019-03-05 NOTE — Progress Notes (Signed)
Inpatient Rehab Admissions Coordinator:   I spoke with pt's son today.  He and family feel like they need more time to pull together potential support for patient at discharge and are opting for SNF level for longer term rehab.  Will sign off and let CM/CSW know.   Shann Medal, PT, DPT Admissions Coordinator [phone] 03/05/19  10:09 AM

## 2019-03-06 ENCOUNTER — Other Ambulatory Visit: Payer: Self-pay | Admitting: Neurosurgery

## 2019-03-06 ENCOUNTER — Ambulatory Visit
Admit: 2019-03-06 | Discharge: 2019-03-06 | Disposition: A | Discharge status: Home / Self Care | Payer: PPO | Source: Ambulatory Visit | Attending: Radiation Oncology | Admitting: Radiation Oncology

## 2019-03-06 ENCOUNTER — Other Ambulatory Visit: Payer: Self-pay | Admitting: Radiation Therapy

## 2019-03-06 ENCOUNTER — Inpatient Hospital Stay (HOSPITAL_COMMUNITY): Payer: PPO

## 2019-03-06 ENCOUNTER — Other Ambulatory Visit: Payer: Self-pay | Admitting: Urology

## 2019-03-06 DIAGNOSIS — D496 Neoplasm of unspecified behavior of brain: Secondary | ICD-10-CM | POA: Insufficient documentation

## 2019-03-06 DIAGNOSIS — C7931 Secondary malignant neoplasm of brain: Secondary | ICD-10-CM

## 2019-03-06 DIAGNOSIS — C7949 Secondary malignant neoplasm of other parts of nervous system: Secondary | ICD-10-CM

## 2019-03-06 LAB — URINALYSIS, ROUTINE W REFLEX MICROSCOPIC
Bilirubin Urine: NEGATIVE
Glucose, UA: NEGATIVE mg/dL
Hgb urine dipstick: NEGATIVE
Ketones, ur: NEGATIVE mg/dL
Leukocytes,Ua: NEGATIVE
Nitrite: NEGATIVE
Protein, ur: NEGATIVE mg/dL
Specific Gravity, Urine: 1.012 (ref 1.005–1.030)
pH: 6 (ref 5.0–8.0)

## 2019-03-06 LAB — RAPID URINE DRUG SCREEN, HOSP PERFORMED
Amphetamines: NOT DETECTED
Barbiturates: NOT DETECTED
Benzodiazepines: POSITIVE — AB
Cocaine: NOT DETECTED
Opiates: NOT DETECTED
Tetrahydrocannabinol: NOT DETECTED

## 2019-03-06 NOTE — TOC Initial Note (Signed)
Transition of Care [facility]) - Initial/Assessment Note    Patient Details  Name: [name] MRN: [id number] Date of Birth: 28-Jun-1941  [facility] of [facility]) CM/SW Contact:    Geralynn Ochs, LCSW Phone Number: 03/06/2019, 4:19 PM  Clinical Narrative:  CSW attempted to meet with patient multiple times today but was asleep or with someone. Per MD, patient has severe expressive aphasia and seems to have memory issues, would be best to discuss care plans with the son. CSW reached out to son, Quillian Quince, via phone to discuss SNF placement. Daniel in agreement with SNF placement, would prefer placement in [facility] if the patient is going to have to come back to the hospital for a biopsy and possible treatments. CSW to fax out referral. CSW sent an email to Quillian Quince with the CMS list of facilities. CSW to follow.                 Expected Discharge Plan: Skilled Nursing Facility Barriers to Discharge: Continued Medical Work up, Ship broker   Patient Goals and CMS Choice Patient states their goals for this hospitalization and ongoing recovery are:: patient unable to state goal at this time; son wants the patient to get well and get back home CMS Medicare.gov Compare Post Acute Care list provided to:: Patient Represenative (must comment) Choice offered to / list presented to : Adult Children  Expected Discharge Plan and Services Expected Discharge Plan: [place] Acute Care Choice: [place] arrangements for the past 2 months: Single Family Home                                      Prior Living Arrangements/Services Living arrangements for the past 2 months: Single Family Home Lives with:: Self Patient language and need for interpreter reviewed:: No Do you feel safe going back to the place where you live?: Yes      Need for Family Participation in Patient Care: Yes (Comment) Care giver support system in place?: No  (comment)   Criminal Activity/Legal Involvement Pertinent to Current Situation/Hospitalization: No - Comment as needed  Activities of Daily Living Home Assistive Devices/Equipment: Walker (specify type) ADL Screening (condition at time of admission) Patient's cognitive ability adequate to safely complete daily activities?: No Is the patient deaf or have difficulty hearing?: No Does the patient have difficulty seeing, even when wearing glasses/contacts?: No Does the patient have difficulty concentrating, remembering, or making decisions?: No Patient able to express need for assistance with ADLs?: Yes Does the patient have difficulty dressing or bathing?: Yes Independently performs ADLs?: No Communication: Needs assistance Is this a change from baseline?: Change from baseline, expected to last >3 days Dressing (OT): Needs assistance Is this a change from baseline?: Change from baseline, expected to last >3 days Grooming: Needs assistance Is this a change from baseline?: Change from baseline, expected to last >3 days Feeding: Independent Bathing: Needs assistance Is this a change from baseline?: Change from baseline, expected to last >3 days Toileting: Needs assistance Is this a change from baseline?: Change from baseline, expected to last >3days In/Out Bed: Needs assistance Is this a change from baseline?: Change from baseline, expected to last >3 days Walks in Home: Needs assistance Is this a change from baseline?: Change from baseline, expected to last >3 days Does the patient have difficulty walking or climbing stairs?: Yes Weakness of Legs:  None Weakness of Arms/Hands: None  Permission Sought/Granted Permission sought to share information with : Facility Sport and exercise psychologist, Family Supports Permission granted to share information with : Yes, Verbal Permission Granted  Share Information with NAME: Quillian Quince  Permission granted to share info w AGENCY: SNF  Permission granted to  share info w Relationship: Son     Emotional Assessment   Attitude/Demeanor/Rapport: Unable to Assess Affect (typically observed): Unable to Assess Orientation: : Oriented to Self, Oriented to Place, Oriented to  Time, Oriented to Situation Alcohol / Substance Use: Not Applicable Psych Involvement: No (comment)  Admission diagnosis:  Hemorrhagic stroke [facility]) [I61.9] Patient Active Problem List   Diagnosis Date Noted  . Cytotoxic brain edema ([place]) 03/04/2019  . ICH (intracerebral hemorrhage) ([place]) 03/03/2019  . Osteoporosis 05/16/2018  . S/P ORIF (open reduction internal fixation) fracture right patella 03/18/18 03/26/2018  . Cellulitis, wound, post-operative 03/22/2018  . Fracture of right patella   . Normocytic anemia   . Esophageal dysphagia 11/27/2017  . Heme positive stool 11/27/2017  . Iron deficiency anemia 12/05/2016  . SIADH (syndrome of inappropriate ADH production) ([place])   . Polypharmacy 09/14/2016  . Muscle weakness (generalized)   . Syncope 09/10/2016  . Rib pain on left side   . Hypersomnia 02/16/2016  . Acute bronchitis 02/16/2016  . Exertional dyspnea 12/20/2015  . HLD (hyperlipidemia) 11/23/2014  . Change in bowel habits 11/10/2014  . Abdominal pain, left lower quadrant 11/10/2014  . Emphysema lung ([place]) 11/08/2014  . Helicobacter pylori gastritis 07/30/2014  . Acute on chronic respiratory failure with hypoxia ([place]) 04/10/2014  . Wrist fracture 04/09/2014  . 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 T12 burst fracture ([place]) 01/13/2014  . Constipation 12/14/2013  . Acute respiratory failure with hypoxia ([place]) 12/14/2013  . COPD (chronic obstructive pulmonary disease) ([place]) 12/14/2013  . Compression fracture of thoracic vertebra ([place]) 12/14/2013  . Hypoxia 12/13/2013  . Compression fracture 12/13/2013  . Diastolic dysfunction   . 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 Abdominal aneurysm without mention of  rupture 02/26/2012  . Dyspepsia 02/11/2012  . Aneurysm ([place]) 02/11/2012  . Trigeminal neuralgia   . Epigastric pain 02/21/2011  . Dysphagia 02/21/2011  . Adenocarcinoma of lung ([place]) 12/07/2010  . HELICOBACTER PYLORI GASTRITIS, HX OF 12/30/2009  . CONTRAST DYE ALLERGY 12/30/2009   PCP:  [name], MD Pharmacy:   [facility] [address] [place] [id number] Phone: [phone] Fax: [phone]     Social Determinants of Health (SDOH) Interventions    Readmission Risk Interventions No flowsheet data found.

## 2019-03-06 NOTE — Consult Note (Signed)
Radiation Oncology         [phone] ________________________________  Initial inpatient Consultation- Conducted via telephone due to current COVID-19 concerns for limiting patient exposure  Name: [name] MRN: [id number]  Date of Service: 03/03/2019 DOB: 03/27/1941  LK:[id number], Cammie Mcgee, MD  No ref. provider found   REFERRING PHYSICIAN: No ref. provider found  DIAGNOSIS: The encounter diagnosis was Hemorrhagic stroke ([place]).    ICD-10-CM   1. Hemorrhagic stroke (HCC) I61.9     HISTORY OF PRESENT ILLNESS: [name] is a 78 y.o. female seen at the request of Dr. Kathyrn Sheriff. When I called to speak with the patient, she was unavailable due to having additional imaging.  Therefore I called and spoke with her son, [name] to confirm the history documented in her record.  [name] confirmed that she presented to the ED at [facility]  on 03/03/2019 with acute onset aphasia and right-sided weakness. 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  This was treated with surgical resection of the left lower lobe on 12/21/2010, 14 lymph nodes negative, 2.2 cm tumor size, margins negative.  She has been followed by Dr. Delton Coombes in observation since that time with serial chest CT scans without evidence of disease recurrence or progression.  Her most recent CT Chest imaging was performed 11/06/18.    On admission, the initial head CT (without contrast due to contrast allergy) showed left parietal and occipital parenchymal hematoma measuring up to 3.7 cm, nonspecific, but felt most likely related to hypertension or underlying vasculitis in a patient of this age. Surrounding edema and mass effect mostly local with minimal midline shift of 3-4 mm; no other focal lesions or hemorrhage. A repeat head CT the next day showed the hematoma as stable. She met with Dr. Kathyrn Sheriff on 03/04/2019. Following the consultation, she  underwent brain MRI (with gadavist contrast) which revealed 5 intraparenchymal metastatic lesions, the largest of which measures 3.7 cm and is located in the posterior left parietal lobe where the associated hemorrhage makes accurate measurement difficult.     She is tentatively scheduled to undergo left craniotomy for resection of the largest lesion in the left parietal lobe under the care and direction of Dr. Kathyrn Sheriff on 03/18/2019 and we are asked to consult to consider pre-op radiation to the multiple brain metastases.  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[place])    corrected by surgery 08/21/13   Anxiety disorder    Aortic aneurysm ([place])    Complication of anesthesia 2011   bloodpressure dropped during colonoscopy, not problems since   Compression fracture    COPD (chronic obstructive pulmonary disease) (HCC)    Depression    Diastolic dysfunction    Diverticulosis    Dysphagia    Emphysema lung ([place]) 11/08/2014   Hiatal hernia    Hypothyroidism    IBS (irritable bowel syndrome)    Iron deficiency anemia due to chronic blood loss 12/05/2016   On home O2 12/15/2013   chronic hypoxia   PUD (peptic ulcer disease)    21yr   Shortness of breath    with exertion   SIADH (syndrome of inappropriate ADH production) (HCC)    Trigeminal neuralgia       PAST SURGICAL HISTORY: Past  Surgical History:  Procedure Laterality Date   ABDOMINAL HYSTERECTOMY     BACK SURGERY  January 13, 2014   CATARACT EXTRACTION     CATARACT EXTRACTION W/PHACO  09/02/2012   Procedure: CATARACT EXTRACTION PHACO AND INTRAOCULAR LENS PLACEMENT ([place]);  Surgeon: Elta Guadeloupe T. Gershon Crane, MD;  Location: [facility];  Service: Ophthalmology;  Laterality: Left;  CDE:10.35   CHOLECYSTECTOMY     COLONOSCOPY  2011   hyperplastic polyp,  3-4 small cecal AVMs, nonbleeding   COLONOSCOPY N/A 11/23/2014   Dr. Oneida Alar: moderate diverticulosis, hemorrhoids, redundant colon. next TCS in 10-15 years.    ENDARTERECTOMY Right 08/21/2013   Procedure: RIGHT CAROTID ANEURYSM RESECTION;  Surgeon: Rosetta Posner, MD;  Location: [facility];  Service: Vascular;  Laterality: Right;   ESOPHAGEAL DILATION  02/24/2018   Procedure: ESOPHAGEAL DILATION;  Surgeon: Danie Binder, MD;  [facility];  Service: Endoscopy;;   ESOPHAGOGASTRODUODENOSCOPY  11/13/2009   w/dilation to 53m, gastric ulceration (H.Pylori) s/p treatment   ESOPHAGOGASTRODUODENOSCOPY  11/21/2009   distal esophageal web, gastritis   ESOPHAGOGASTRODUODENOSCOPY  03/14/12   SOAC:ZYSAYTKZSin the distal esophagus/Mild gastritis/small HH. + H.pylori, prescribed Pylera. Finished treatment.    ESOPHAGOGASTRODUODENOSCOPY N/A 12/13/2017   Procedure: ESOPHAGOGASTRODUODENOSCOPY (EGD);  Surgeon: FDanie Binder MD;  [facility];  Service: Endoscopy;  Laterality: N/A;  8:30am   ESOPHAGOGASTRODUODENOSCOPY N/A 02/24/2018   Procedure: ESOPHAGOGASTRODUODENOSCOPY (EGD);  Surgeon: FDanie Binder MD;  [facility];  Service: Endoscopy;  Laterality: N/A;  11:00am   fatty tumor removal from lt groin     FRACTURE SURGERY Left    hand and left ring finger   GIVENS CAPSULE STUDY N/A 12/26/2017   Procedure: GIVENS CAPSULE STUDY;  Surgeon: FDanie Binder MD;  [facility];  Service: Endoscopy;  Laterality: N/A;  7:30am   GIVENS CAPSULE STUDY N/A 02/24/2018   Procedure: GIVENS CAPSULE STUDY;  Surgeon: FDanie Binder MD;  [facility];  Service: Endoscopy;  Laterality: N/A;   KNEE SURGERY     patella tendon repair june 2019 [place]   LUNG CANCER SURGERY  12/2010   Left VATS, minithoracotomy, LLL superior segmentectomy   ORIF PATELLA Right 03/18/2018   Procedure: OPEN REDUCTION INTERNAL (ORIF) FIXATION RIGHT PATELLA;  Surgeon: HCarole Civil  MD;  Location: [facility];  Service: Orthopedics;  Laterality: Right;   ORIF WRIST FRACTURE Left 04/09/2014   Procedure: OPEN REDUCTION INTERNAL FIXATION (ORIF) WRIST FRACTURE;  Surgeon: TRenette Butters MD;  [place] [facility]  Service: Orthopedics;  Laterality: Left;   PERCUTANEOUS PINNING Left 04/09/2014   Procedure: PERCUTANEOUS PINNING EXTREMITY;  Surgeon: TRenette Butters MD;  [place]  Service: Orthopedics;  Laterality: Left;   SAVORY DILATION N/A 12/13/2017   Procedure: SAVORY DILATION;  Surgeon: FDanie Binder MD;  [facility];  Service: Endoscopy;  Laterality: N/A;   TUBAL LIGATION     vocal cord surgery  02/06/2011   laryngoscopy with bilateral vocal cord Radiesse injection for vocal cord paralysis   YAG LASER APPLICATION Left 101/06/3234  Procedure: YAG LASER APPLICATION;  Surgeon: MRutherford Guys MD;  Location: [facility];  Service: Ophthalmology;  Laterality: Left;    FAMILY HISTORY: Patient's daughter died from breast cancer 16 years ago and she was her primary caregiver.   Family History  Problem Relation Age of Onset   Pulmonary embolism Mother    Heart attack Father    Hypertension Father    Liver  cancer Sister    Cancer Sister    Cancer Brother    Cancer Daughter    Colon cancer Neg Hx     SOCIAL HISTORY:  Social History   Socioeconomic History   Marital status: Divorced    Spouse name: Not on file   Number of children: Not on file   Years of education: Not on file   Highest education level: Not on file  Occupational History   Not on file  Social Needs   Financial resource strain: Somewhat hard   Food insecurity:    Worry: Never true    Inability: Never true   Transportation needs:    Medical: No    Non-medical: No  Tobacco Use   Smoking status: Former Smoker    Packs/day: 0.50    Years: 20.00    Pack years: 10.00    Types: Cigarettes    Last attempt to quit: 12/21/2010    Years since quitting: 8.2   Smokeless  tobacco: Never Used   Tobacco comment: smoking cessation info given and reviewed   Substance and Sexual Activity   Alcohol use: Yes    Alcohol/week: 0.0 standard drinks    Comment: seldom   Drug use: No   Sexual activity: Yes    Birth control/protection: Surgical  Lifestyle   Physical activity:    Days per week: 0 days    Minutes per session: 0 min   Stress: Not at all  Relationships   Social connections:    Talks on phone: Once a week    Gets together: Never    Attends religious service: Never    Active member of club or organization: No    Attends meetings of clubs or organizations: Never    Relationship status: Divorced   Intimate partner violence:    Fear of current or ex partner: No    Emotionally abused: No    Physically abused: No    Forced sexual activity: No  Other Topics Concern   Not on file  Social History Narrative   Not on file    ALLERGIES: Ciprofloxacin hcl; Iohexol; Prozac [fluoxetine hcl]; and Sulfonamide derivatives  MEDICATIONS:  Current Facility-Administered Medications  Medication Dose Route Frequency Provider Last Rate Last Dose    stroke: mapping our early stages of recovery book   Does not apply Once Marliss Coots, PA-C       0.9 %  sodium chloride infusion   Intravenous Continuous Marliss Coots, PA-C   Stopped at 03/04/19 5631   acetaminophen (TYLENOL) tablet 650 mg  650 mg Oral Q4H PRN Kerney Elbe, MD   650 mg at 03/06/19 1017   Or   acetaminophen (TYLENOL) solution 650 mg  650 mg Per Tube Q4H PRN Kerney Elbe, MD       Or   acetaminophen (TYLENOL) suppository 650 mg  650 mg Rectal Q4H PRN Kerney Elbe, MD       ALPRAZolam Duanne Moron) tablet 0.25 mg  0.25 mg Oral BID PRN Kerney Elbe, MD   0.25 mg at 03/05/19 2147   Chlorhexidine Gluconate Cloth 2 % PADS 6 each  6 each Topical Daily Kerney Elbe, MD   6 each at 03/04/19 0936   levothyroxine (SYNTHROID) tablet 50 mcg  50 mcg Oral QAC breakfast Marliss Coots, PA-C   50 mcg  at 03/06/19 0901   MEDLINE mouth rinse  15 mL Mouth Rinse BID Greta Doom, MD   15 mL at 03/06/19 0901  pantoprazole (PROTONIX) injection 40 mg  40 mg Intravenous QHS Marliss Coots, PA-C   40 mg at 03/05/19 2147   promethazine (PHENERGAN) injection 12.5 mg  12.5 mg Intravenous Q8H PRN Kerney Elbe, MD   12.5 mg at 03/03/19 1920   senna-docusate (Senokot-S) tablet 1 tablet  1 tablet Oral BID Marliss Coots, PA-C   1 tablet at 03/06/19 5750   senna-docusate (Senokot-S) tablet 1 tablet  1 tablet Oral QHS PRN Garvin Fila, MD        REVIEW OF SYSTEMS:  [name] reports that he spoke with his mother by phone earlier today and she had significant improvement in her aphasia and cognition, actually directing the phone conversation. She was able to recall names of people she had interacted with earlier in the day and was aware that she was in the hospital. She reported having mild headaches but otherwise reported feeling well in general.    PHYSICAL EXAM:  Wt Readings from Last 3 Encounters:  03/03/19 125 lb 10.6 oz (57 kg)  11/13/18 126 lb 8 oz (57.4 kg)  09/24/18 122 lb (55.3 kg)   Temp Readings from Last 3 Encounters:  03/06/19 98.5 F (36.9 C) (Oral)  11/13/18 98.2 F (36.8 C) (Oral)  08/05/18 97.6 F (36.4 C) (Oral)   BP Readings from Last 3 Encounters:  03/06/19 115/73  11/13/18 116/66  09/24/18 123/74   Pulse Readings from Last 3 Encounters:  03/06/19 76  11/13/18 67  09/24/18 60   Pain Assessment Pain Score: 3 /10  Unable to assess due to phone consult format.  KPS = 50  100 - Normal; no complaints; no evidence of disease. 90   - Able to carry on normal activity; minor signs or symptoms of disease. 80   - Normal activity with effort; some signs or symptoms of disease. 10   - Cares for self; unable to carry on normal activity or to do active work. 60   - Requires occasional assistance, but is able to care for most of his personal needs. 50   - Requires  considerable assistance and frequent medical care. 26   - Disabled; requires special care and assistance. 40   - Severely disabled; hospital admission is indicated although death not imminent. 5   - Very sick; hospital admission necessary; active supportive treatment necessary. 10   - Moribund; fatal processes progressing rapidly. 0     - Dead  Karnofsky DA, [facility] [place], Craver LS and Burchenal JH [phone]) The use of the nitrogen mustards in the palliative treatment of carcinoma: with particular reference to bronchogenic carcinoma Cancer 1 634-56  LABORATORY DATA:  Lab Results  Component Value Date   WBC 9.8 03/03/2019   HGB 13.0 03/03/2019   HCT 39.5 03/03/2019   MCV 92.1 03/03/2019   PLT 298 03/03/2019   Lab Results  Component Value Date   NA 135 03/03/2019   K 3.9 03/03/2019   CL 95 (L) 03/03/2019   CO2 28 03/03/2019   Lab Results  Component Value Date   ALT 14 03/03/2019   AST 19 03/03/2019   ALKPHOS 48 03/03/2019   BILITOT 0.6 03/03/2019     RADIOGRAPHY: Ct Abdomen Pelvis Wo Contrast  Result Date: 03/06/2019 CLINICAL DATA:  Acute onset generalized pain today. EXAM: CT CHEST, ABDOMEN AND PELVIS WITHOUT CONTRAST TECHNIQUE: Multidetector CT imaging of the chest, abdomen and pelvis was performed following the standard protocol without IV contrast. COMPARISON:  CT chest 05/06/2018 and 05/09/2017. FINDINGS: CT  CHEST FINDINGS Cardiovascular: Heart size is normal. No pericardial effusion. Calcific aortic atherosclerosis noted. Mediastinum/Nodes: No enlarged mediastinal, hilar, or axillary lymph nodes. Thyroid gland, trachea, and esophagus demonstrate no significant findings. Lungs/Pleura: Extensive centrilobular emphysematous disease is identified. A 0.4 cm nodule in the superior segment of the right lower lobe on image 65 is unchanged. No new or enlarging pulmonary nodule is identified. There is some dependent airspace disease which is greater on the right. Suture material in the left  lower lobe noted. Musculoskeletal: Severe T6 compression fracture and superior endplate compression fracture T8 are unchanged. Pedicle screws and stabilization bars from T10-L2 noted. Severe T12 and L1 compression fractures are unchanged. No new fracture identified. No lytic or sclerotic lesion. CT ABDOMEN PELVIS FINDINGS Hepatobiliary: No focal liver abnormality is seen. Status post cholecystectomy. No biliary dilatation. Pancreas: Unremarkable. No pancreatic ductal dilatation or surrounding inflammatory changes. Spleen: Normal in size without focal abnormality. Adrenals/Urinary Tract: Punctate parenchymal calcification the right kidney is unchanged. The kidneys otherwise appear. Ureters and urinary are unremarkable. Adrenal glands are. Stomach/Bowel: Stomach is within normal limits. Appendix appears normal. No evidence of bowel wall thickening, distention, or inflammatory changes. Scattered diverticulosis noted. Vascular/Lymphatic: Extensive atherosclerotic vascular disease is present. The descending abdominal aorta measures up to 3.4 cm in diameter and the right common iliac artery measures 1.4 cm. Reproductive: Status post hysterectomy. No adnexal masses. Other: None. Musculoskeletal: There is a mild compression fracture deformity of L3 which appears remote. There is convex right lumbar scoliosis and multilevel degenerative change. Advanced right hip osteoarthritis is noted. IMPRESSION: Dependent bibasilar airspace disease is greater on the right and likely represents atelectasis but could be secondary to pneumonia. No other finding to suggest acute abnormality is seen in the chest, abdomen or pelvis. Emphysema. Atherosclerosis. 3.4 cm abdominal aortic aneurysm is identified. Recommend followup by ultrasound in 3 years. This recommendation follows ACR consensus guidelines: White Paper of the ACR Incidental Findings Committee II on Vascular Findings. J Am Coll Radiol 2013; 10:789-794. Diverticulosis  diverticulitis. Remote thoracic and lumbar spine compression fractures. Electronically Signed   By: Inge Rise M.D.   On: 03/06/2019 17:40   Ct Head Wo Contrast  Result Date: 03/04/2019 CLINICAL DATA:  Intracranial hemorrhage follow-up EXAM: CT HEAD WITHOUT CONTRAST TECHNIQUE: Contiguous axial images were obtained from the base of the skull through the vertex without intravenous contrast. COMPARISON:  Brain MRI from yesterday FINDINGS: Brain: Up to 4.2 Cm (craniocaudal) parenchymal hematoma in the left parietal lobe that is unchanged in extent when compared to brain MRI yesterday. There is a rim of edema that is also stable. Local swelling without midline shift. No hydrocephalus or extra-axial collection. No visible infarct Vascular: Atherosclerotic calcification Skull: No acute or aggressive finding Sinuses/Orbits: Bilateral cataract resection. IMPRESSION: Size stable left parietal hematoma with edema. No new abnormality or midline shift. Electronically Signed   By: Monte Fantasia M.D.   On: 03/04/2019 05:25   Ct Chest Wo Contrast  Result Date: 03/06/2019 CLINICAL DATA:  Acute onset generalized pain today. EXAM: CT CHEST, ABDOMEN AND PELVIS WITHOUT CONTRAST TECHNIQUE: Multidetector CT imaging of the chest, abdomen and pelvis was performed following the standard protocol without IV contrast. COMPARISON:  CT chest 05/06/2018 and 05/09/2017. FINDINGS: CT CHEST FINDINGS Cardiovascular: Heart size is normal. No pericardial effusion. Calcific aortic atherosclerosis noted. Mediastinum/Nodes: No enlarged mediastinal, hilar, or axillary lymph nodes. Thyroid gland, trachea, and esophagus demonstrate no significant findings. Lungs/Pleura: Extensive centrilobular emphysematous disease is identified. A 0.4 cm nodule in the superior  segment of the right lower lobe on image 65 is unchanged. No new or enlarging pulmonary nodule is identified. There is some dependent airspace disease which is greater on the right.  Suture material in the left lower lobe noted. Musculoskeletal: Severe T6 compression fracture and superior endplate compression fracture T8 are unchanged. Pedicle screws and stabilization bars from T10-L2 noted. Severe T12 and L1 compression fractures are unchanged. No new fracture identified. No lytic or sclerotic lesion. CT ABDOMEN PELVIS FINDINGS Hepatobiliary: No focal liver abnormality is seen. Status post cholecystectomy. No biliary dilatation. Pancreas: Unremarkable. No pancreatic ductal dilatation or surrounding inflammatory changes. Spleen: Normal in size without focal abnormality. Adrenals/Urinary Tract: Punctate parenchymal calcification the right kidney is unchanged. The kidneys otherwise appear. Ureters and urinary are unremarkable. Adrenal glands are. Stomach/Bowel: Stomach is within normal limits. Appendix appears normal. No evidence of bowel wall thickening, distention, or inflammatory changes. Scattered diverticulosis noted. Vascular/Lymphatic: Extensive atherosclerotic vascular disease is present. The descending abdominal aorta measures up to 3.4 cm in diameter and the right common iliac artery measures 1.4 cm. Reproductive: Status post hysterectomy. No adnexal masses. Other: None. Musculoskeletal: There is a mild compression fracture deformity of L3 which appears remote. There is convex right lumbar scoliosis and multilevel degenerative change. Advanced right hip osteoarthritis is noted. IMPRESSION: Dependent bibasilar airspace disease is greater on the right and likely represents atelectasis but could be secondary to pneumonia. No other finding to suggest acute abnormality is seen in the chest, abdomen or pelvis. Emphysema. Atherosclerosis. 3.4 cm abdominal aortic aneurysm is identified. Recommend followup by ultrasound in 3 years. This recommendation follows ACR consensus guidelines: White Paper of the ACR Incidental Findings Committee II on Vascular Findings. J Am Coll Radiol 2013; 10:789-794.  Diverticulosis diverticulitis. Remote thoracic and lumbar spine compression fractures. Electronically Signed   By: Inge Rise M.D.   On: 03/06/2019 17:40   Mr Virgel Paling IY Contrast  Result Date: 03/04/2019 CLINICAL DATA:  Initial evaluation for acute aphasia, right-sided weakness, confusion. EXAM: MRI HEAD WITHOUT CONTRAST MRA HEAD WITHOUT CONTRAST MRA NECK WITHOUT AND WITH CONTRAST TECHNIQUE: Multiplanar, multiecho pulse sequences of the brain and surrounding structures were obtained without intravenous contrast. Angiographic images of the Circle of Willis were obtained using MRA technique without intravenous contrast. Angiographic images of the neck were obtained using MRA technique without and with intravenous contrast. Carotid stenosis measurements (when applicable) are obtained utilizing NASCET criteria, using the distal internal carotid diameter as the denominator. CONTRAST:  5.7 cc Gadavist. COMPARISON:  Prior CT from earlier the same day. FINDINGS: MRI HEAD FINDINGS Cerebral volume within normal limits for age. Minimal T2/FLAIR hyperintensity noted within the periventricular white matter, nonspecific, felt to be within normal limits for age. Previously identified intraparenchymal hemorrhage centered at the left parietal lobe again seen, measuring 2.9 x 4.0 x 4.3 cm (estimated volume 25 cc). Surrounding vasogenic edema with mild regional mass effect. No midline shift. No intraventricular or extra-axial extension. No discernible underlying mass lesion or other abnormality. No significant dot burden to suggest cerebral amyloid angiopathy. No findings to suggest overlying trauma. Etiology unclear. No other abnormal foci of restricted diffusion to suggest acute or subacute ischemia. Gray-white matter differentiation otherwise maintained. No encephalomalacia to suggest chronic cortical infarction. No other acute or chronic intracranial hemorrhage. No mass lesion or midline shift. No hydrocephalus. No  extra-axial fluid collection. Pituitary gland suprasellar region normal. Midline structures intact. Major intracranial vascular flow voids maintained. Craniocervical junction normal. No focal marrow replacing lesion. Scalp soft tissues unremarkable.  Patient status post bilateral ocular lens replacement. Paranasal sinuses are clear. Small left mastoid effusion noted, of doubtful significance. MRA HEAD FINDINGS ANTERIOR CIRCULATION: Examination moderately degraded by motion artifact. Distal cervical segments of the internal carotid arteries are patent with symmetric antegrade flow. Distal cervical left ICA tortuous. Petrous, cavernous, and supraclinoid segments patent without appreciable stenosis. A1 segments patent bilaterally. Anterior cerebral arteries patent to their distal aspects without definite stenosis. Anterior communicating artery not well assessed on this motion degraded exam. No M1 occlusion. Possible short-segment mild distal left M1 stenosis noted (series 1052, image 11). Normal MCA bifurcations. Distal MCA branches well perfused and grossly symmetric. No vascular abnormality seen underlying the partially visualized left parietal hematoma. POSTERIOR CIRCULATION: Vertebral arteries patent to the vertebrobasilar junction without stenosis. Vertebral arteries largely code dominant. Basilar widely patent to its distal aspect. Superior cerebral arteries patent bilaterally. Both of the posterior cerebral arteries primarily supplied via the basilar and are well perfused to their distal aspects. No definite flow-limiting stenosis. Prominent left posterior communicating artery noted. No visible aneurysm or other vascular abnormality on this motion degraded exam. MRA NECK FINDINGS Examination technically limited by motion artifact. Visualized aortic arch of normal caliber with normal 3 vessel morphology. No flow-limiting stenosis about the origin of the great vessels. Visualized subclavian arteries widely patent.  Right common carotid artery widely patent from its origin to the bifurcation. Mild atheromatous irregularity about the right bifurcation without hemodynamically significant stenosis. Right ICA patent distally to the circle-of-Willis without stenosis or occlusion. Left common carotid artery patent from its origin to the bifurcation without stenosis. No significant atheromatous narrowing about the left bifurcation. Left ICA tortuous but patent to the circle-of-Willis without stenosis or occlusion. Both of the vertebral arteries arise from the subclavian arteries. Vertebral arteries largely code dominant. Probable short-segment moderate stenosis at the origin of the left vertebral artery (series 1084, image 14). Vertebral arteries otherwise widely patent within the neck without stenosis or occlusion. IMPRESSION: MRI HEAD IMPRESSION: 1. Stable size of acute intraparenchymal hemorrhage centered at the left parietal convexity, estimated volume 25 cc. Surrounding vasogenic edema with mild regional mass effect without midline shift. No identifiable etiology identified on this exam. 2. Otherwise normal brain MRI for age. MRA HEAD IMPRESSION: 1. Technically limited exam due to moderate motion degradation. 2. Grossly negative intracranial MRA. No large vessel occlusion, hemodynamically significant stenosis, or other acute vascular process. No vascular abnormality seen underlying the left parietal hemorrhage. MRA NECK IMPRESSION: 1. Wide patency of the carotid artery systems bilaterally within the neck. 2. Short-segment moderate stenosis at the origin of the left vertebral artery. Vertebral arteries otherwise widely patent within the neck. Vertebral arteries are co dominant. Electronically Signed   By: Jeannine Boga M.D.   On: 03/04/2019 01:21   Mr Jodene Nam Neck W Wo Contrast  Result Date: 03/04/2019 CLINICAL DATA:  Initial evaluation for acute aphasia, right-sided weakness, confusion. EXAM: MRI HEAD WITHOUT CONTRAST MRA  HEAD WITHOUT CONTRAST MRA NECK WITHOUT AND WITH CONTRAST TECHNIQUE: Multiplanar, multiecho pulse sequences of the brain and surrounding structures were obtained without intravenous contrast. Angiographic images of the Circle of Willis were obtained using MRA technique without intravenous contrast. Angiographic images of the neck were obtained using MRA technique without and with intravenous contrast. Carotid stenosis measurements (when applicable) are obtained utilizing NASCET criteria, using the distal internal carotid diameter as the denominator. CONTRAST:  5.7 cc Gadavist. COMPARISON:  Prior CT from earlier the same day. FINDINGS: MRI HEAD FINDINGS Cerebral volume within normal  limits for age. Minimal T2/FLAIR hyperintensity noted within the periventricular white matter, nonspecific, felt to be within normal limits for age. Previously identified intraparenchymal hemorrhage centered at the left parietal lobe again seen, measuring 2.9 x 4.0 x 4.3 cm (estimated volume 25 cc). Surrounding vasogenic edema with mild regional mass effect. No midline shift. No intraventricular or extra-axial extension. No discernible underlying mass lesion or other abnormality. No significant dot burden to suggest cerebral amyloid angiopathy. No findings to suggest overlying trauma. Etiology unclear. No other abnormal foci of restricted diffusion to suggest acute or subacute ischemia. Gray-white matter differentiation otherwise maintained. No encephalomalacia to suggest chronic cortical infarction. No other acute or chronic intracranial hemorrhage. No mass lesion or midline shift. No hydrocephalus. No extra-axial fluid collection. Pituitary gland suprasellar region normal. Midline structures intact. Major intracranial vascular flow voids maintained. Craniocervical junction normal. No focal marrow replacing lesion. Scalp soft tissues unremarkable. Patient status post bilateral ocular lens replacement. Paranasal sinuses are clear. Small  left mastoid effusion noted, of doubtful significance. MRA HEAD FINDINGS ANTERIOR CIRCULATION: Examination moderately degraded by motion artifact. Distal cervical segments of the internal carotid arteries are patent with symmetric antegrade flow. Distal cervical left ICA tortuous. Petrous, cavernous, and supraclinoid segments patent without appreciable stenosis. A1 segments patent bilaterally. Anterior cerebral arteries patent to their distal aspects without definite stenosis. Anterior communicating artery not well assessed on this motion degraded exam. No M1 occlusion. Possible short-segment mild distal left M1 stenosis noted (series 1052, image 11). Normal MCA bifurcations. Distal MCA branches well perfused and grossly symmetric. No vascular abnormality seen underlying the partially visualized left parietal hematoma. POSTERIOR CIRCULATION: Vertebral arteries patent to the vertebrobasilar junction without stenosis. Vertebral arteries largely code dominant. Basilar widely patent to its distal aspect. Superior cerebral arteries patent bilaterally. Both of the posterior cerebral arteries primarily supplied via the basilar and are well perfused to their distal aspects. No definite flow-limiting stenosis. Prominent left posterior communicating artery noted. No visible aneurysm or other vascular abnormality on this motion degraded exam. MRA NECK FINDINGS Examination technically limited by motion artifact. Visualized aortic arch of normal caliber with normal 3 vessel morphology. No flow-limiting stenosis about the origin of the great vessels. Visualized subclavian arteries widely patent. Right common carotid artery widely patent from its origin to the bifurcation. Mild atheromatous irregularity about the right bifurcation without hemodynamically significant stenosis. Right ICA patent distally to the circle-of-Willis without stenosis or occlusion. Left common carotid artery patent from its origin to the bifurcation without  stenosis. No significant atheromatous narrowing about the left bifurcation. Left ICA tortuous but patent to the circle-of-Willis without stenosis or occlusion. Both of the vertebral arteries arise from the subclavian arteries. Vertebral arteries largely code dominant. Probable short-segment moderate stenosis at the origin of the left vertebral artery (series 1084, image 14). Vertebral arteries otherwise widely patent within the neck without stenosis or occlusion. IMPRESSION: MRI HEAD IMPRESSION: 1. Stable size of acute intraparenchymal hemorrhage centered at the left parietal convexity, estimated volume 25 cc. Surrounding vasogenic edema with mild regional mass effect without midline shift. No identifiable etiology identified on this exam. 2. Otherwise normal brain MRI for age. MRA HEAD IMPRESSION: 1. Technically limited exam due to moderate motion degradation. 2. Grossly negative intracranial MRA. No large vessel occlusion, hemodynamically significant stenosis, or other acute vascular process. No vascular abnormality seen underlying the left parietal hemorrhage. MRA NECK IMPRESSION: 1. Wide patency of the carotid artery systems bilaterally within the neck. 2. Short-segment moderate stenosis at the origin of the  left vertebral artery. Vertebral arteries otherwise widely patent within the neck. Vertebral arteries are co dominant. Electronically Signed   By: Jeannine Boga M.D.   On: 03/04/2019 01:21   Mr Brain Wo Contrast  Result Date: 03/04/2019 CLINICAL DATA:  Initial evaluation for acute aphasia, right-sided weakness, confusion. EXAM: MRI HEAD WITHOUT CONTRAST MRA HEAD WITHOUT CONTRAST MRA NECK WITHOUT AND WITH CONTRAST TECHNIQUE: Multiplanar, multiecho pulse sequences of the brain and surrounding structures were obtained without intravenous contrast. Angiographic images of the Circle of Willis were obtained using MRA technique without intravenous contrast. Angiographic images of the neck were obtained  using MRA technique without and with intravenous contrast. Carotid stenosis measurements (when applicable) are obtained utilizing NASCET criteria, using the distal internal carotid diameter as the denominator. CONTRAST:  5.7 cc Gadavist. COMPARISON:  Prior CT from earlier the same day. FINDINGS: MRI HEAD FINDINGS Cerebral volume within normal limits for age. Minimal T2/FLAIR hyperintensity noted within the periventricular white matter, nonspecific, felt to be within normal limits for age. Previously identified intraparenchymal hemorrhage centered at the left parietal lobe again seen, measuring 2.9 x 4.0 x 4.3 cm (estimated volume 25 cc). Surrounding vasogenic edema with mild regional mass effect. No midline shift. No intraventricular or extra-axial extension. No discernible underlying mass lesion or other abnormality. No significant dot burden to suggest cerebral amyloid angiopathy. No findings to suggest overlying trauma. Etiology unclear. No other abnormal foci of restricted diffusion to suggest acute or subacute ischemia. Gray-white matter differentiation otherwise maintained. No encephalomalacia to suggest chronic cortical infarction. No other acute or chronic intracranial hemorrhage. No mass lesion or midline shift. No hydrocephalus. No extra-axial fluid collection. Pituitary gland suprasellar region normal. Midline structures intact. Major intracranial vascular flow voids maintained. Craniocervical junction normal. No focal marrow replacing lesion. Scalp soft tissues unremarkable. Patient status post bilateral ocular lens replacement. Paranasal sinuses are clear. Small left mastoid effusion noted, of doubtful significance. MRA HEAD FINDINGS ANTERIOR CIRCULATION: Examination moderately degraded by motion artifact. Distal cervical segments of the internal carotid arteries are patent with symmetric antegrade flow. Distal cervical left ICA tortuous. Petrous, cavernous, and supraclinoid segments patent without  appreciable stenosis. A1 segments patent bilaterally. Anterior cerebral arteries patent to their distal aspects without definite stenosis. Anterior communicating artery not well assessed on this motion degraded exam. No M1 occlusion. Possible short-segment mild distal left M1 stenosis noted (series 1052, image 11). Normal MCA bifurcations. Distal MCA branches well perfused and grossly symmetric. No vascular abnormality seen underlying the partially visualized left parietal hematoma. POSTERIOR CIRCULATION: Vertebral arteries patent to the vertebrobasilar junction without stenosis. Vertebral arteries largely code dominant. Basilar widely patent to its distal aspect. Superior cerebral arteries patent bilaterally. Both of the posterior cerebral arteries primarily supplied via the basilar and are well perfused to their distal aspects. No definite flow-limiting stenosis. Prominent left posterior communicating artery noted. No visible aneurysm or other vascular abnormality on this motion degraded exam. MRA NECK FINDINGS Examination technically limited by motion artifact. Visualized aortic arch of normal caliber with normal 3 vessel morphology. No flow-limiting stenosis about the origin of the great vessels. Visualized subclavian arteries widely patent. Right common carotid artery widely patent from its origin to the bifurcation. Mild atheromatous irregularity about the right bifurcation without hemodynamically significant stenosis. Right ICA patent distally to the circle-of-Willis without stenosis or occlusion. Left common carotid artery patent from its origin to the bifurcation without stenosis. No significant atheromatous narrowing about the left bifurcation. Left ICA tortuous but patent to the circle-of-Willis without stenosis  or occlusion. Both of the vertebral arteries arise from the subclavian arteries. Vertebral arteries largely code dominant. Probable short-segment moderate stenosis at the origin of the left  vertebral artery (series 1084, image 14). Vertebral arteries otherwise widely patent within the neck without stenosis or occlusion. IMPRESSION: MRI HEAD IMPRESSION: 1. Stable size of acute intraparenchymal hemorrhage centered at the left parietal convexity, estimated volume 25 cc. Surrounding vasogenic edema with mild regional mass effect without midline shift. No identifiable etiology identified on this exam. 2. Otherwise normal brain MRI for age. MRA HEAD IMPRESSION: 1. Technically limited exam due to moderate motion degradation. 2. Grossly negative intracranial MRA. No large vessel occlusion, hemodynamically significant stenosis, or other acute vascular process. No vascular abnormality seen underlying the left parietal hemorrhage. MRA NECK IMPRESSION: 1. Wide patency of the carotid artery systems bilaterally within the neck. 2. Short-segment moderate stenosis at the origin of the left vertebral artery. Vertebral arteries otherwise widely patent within the neck. Vertebral arteries are co dominant. Electronically Signed   By: Jeannine Boga M.D.   On: 03/04/2019 01:21   Mr Brain W Contrast  Result Date: 03/04/2019 CLINICAL DATA:  Intracranial hemorrhage.  History of lung cancer. EXAM: MRI HEAD WITH CONTRAST TECHNIQUE: Multiplanar, multiecho pulse sequences of the brain and surrounding structures were obtained with intravenous contrast. CONTRAST:  6 mL Gadavist COMPARISON:  Noncontrast MRI of the brain 03/03/2019 FINDINGS: Postcontrast T1-weighted imaging of the brain shows multiple intraparenchymal contrast-enhancing lesions. 1. Left cerebellum 5 x 5 mm, 2:40 2. Right temporal lobe 2 mm, 2:51 3. Peripheral contrast enhancement at the site of the known intraparenchymal hematoma in the posterior left parietal lobe, altogether measuring 3.7 x 2.9 cm, 2:87 4. Posterior left insula 5 x 5 mm, 2:91 5. Left frontal precentral gyrus 6 x 6 mm, 2:124 No contrast-enhancing lesions of the calvarium or extracranial  soft tissues. IMPRESSION: 1. Five intraparenchymal metastatic lesions, the largest of which is located in the posterior left parietal lobe where the associated hemorrhage makes accurate measurement difficult. 2. Unchanged size of hematoma. Electronically Signed   By: Ulyses Jarred M.D.   On: 03/04/2019 17:57   Ct Head Code Stroke Wo Contrast  Result Date: 03/03/2019 CLINICAL DATA:  Code stroke. Acute onset of aphasia and right-sided weakness. EXAM: CT HEAD WITHOUT CONTRAST TECHNIQUE: Contiguous axial images were obtained from the base of the skull through the vertex without intravenous contrast. COMPARISON:  CT head without contrast 09/11/2016 FINDINGS: Brain: An acute left parietal and occipital hemorrhage measures 3.5 x 3.7 x 3.0 cm. There is surrounding edema with effacement of the adjacent sulci and minimal mass effect on the posterior horn of the left lateral ventricle. 3 mm midline shift is present. No other focal hemorrhage is present. No other mass lesion or acute infarct is evident. Basal ganglia are intact. Insular ribbon is normal. No significant extra-axial fluid collection is present. Vascular: Minimal calcifications are present. There is no hyperdense vessel. Skull: Calvarium is intact. No focal lytic or blastic lesions are present. Sinuses/Orbits: The paranasal sinuses and mastoid air cells are clear. Bilateral lens replacements are noted. Globes and orbits are otherwise unremarkable. IMPRESSION: 1. Left parietal and occipital parenchymal hematoma measuring up to 3.7 cm. This is nonspecific. It is most likely related to hypertension or underlying vasculitis in a patient of this age. The differential diagnosis includes metastatic disease or other vascular malformation. No other lesions are evident. 2. Surrounding edema and mass effect is mostly local with minimal midline shift of 3-4 mm.  3. No other focal lesions or hemorrhage. Critical Value/emergent results were called by telephone at the time of  interpretation on 03/03/2019 at 1:39 pm to Dr. Gerlene Fee , who verbally acknowledged these results. Electronically Signed   By: San Morelle M.D.   On: 03/03/2019 13:46      IMPRESSION/PLAN:  This visit was conducted via telephone to spare the patient unnecessary potential exposure in the healthcare setting during the current COVID-19 pandemic. 10. 78 y.o. 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 At this point, the patient would potentially benefit from radiotherapy. The options include whole brain irradiation versus stereotactic radiosurgery. There are pros and cons associated with each of these potential treatment options. Whole brain radiotherapy would treat the known metastatic deposits and help provide some reduction of risk for future brain metastases. However, whole brain radiotherapy carries potential risks including hair loss, subacute somnolence, and neurocognitive changes including a possible reduction in short-term memory. Whole brain radiotherapy also may carry a lower likelihood of tumor control at the treatment sites because of the low-dose used. Stereotactic radiosurgery carries a higher likelihood for local tumor control at the targeted sites with lower associated risk for neurocognitive changes such as memory loss. However, the use of stereotactic radiosurgery in this setting may leave the patient at increased risk for new brain metastases elsewhere in the brain as high as 50-60%. Accordingly, patients who receive stereotactic radiosurgery in this setting should undergo ongoing surveillance imaging with brain MRI more frequently in order to identify and treat new small brain metastases before they become symptomatic. Stereotactic radiosurgery does carry some different risks, including a risk of radionecrosis.  PLAN: Today, I reviewed the findings and workup thus far with the patient's son, [name] and his wife. We discussed the dilemma regarding whole  brain radiotherapy versus stereotactic radiosurgery. We discussed the pros and cons of each. We also discussed the logistics and delivery of each. We reviewed the results associated with each of the treatments described above. We discussed the current recommendation is to proceed with 3T MRI to be completed this weekend for further evaluation and treatment planning.  We will also order CT C/A/P for disease restaging given the new diagnosis of brain metastases and last imaging performed in 10/2018 which did not show evidence of disease recurrance or progression. Pending those results, we will discuss her case at the upcoming multidisciplinary brain and spine conference to formulate the final treatment recommendation. At this time, we anticipate offering a single fraction of SRS prior to proceeding with surgical resection of the largest lesion in the posterior left parietal lobe measuring 3.7 x 2.9 cm.  Dr. Mickeal Skinner is planning to meet with the patient later today to discuss findings and recommendations to date and we will plan to connect with the patient following the multidisciplinary brain tumor board to share the formal treatment recommendations at that time.  A formal consultation with the patient will be conducted in our department at the time of CT Simulation if the patient agrees to proceed with radiotherapy.  The patient's son did share that based on a conversation with his mother earlier today, she may be reluctant to proceed with treatment involving systemic chemotherapy and/or radiation based on her prior experience with her daughter's breast cancer treatment. However, after our conversation today, he feels that she would likely be more amenable to consider [place] treatment as opposed to a longer course of radiotherapy.  Given current concerns for patient exposure during the COVID-19 pandemic, this encounter  was conducted via telephone.  Unfortunately, the patient was away from her room for further testing at  the time of this consult and therefore the information obtained during this encounter was provided by her son, [name]. The time spent during this encounter was 40 minutes. The attendants for this meeting include Tyler Pita MD, Trisa Cranor PA-C, and the patient's son, [name] and his wife. During the encounter, Tyler Pita MD and Freeman Caldron PA-C were located at [facility] Radiation Oncology Department.  [name] and his wife were located at home.   Nicholos Johns, PA-C    Tyler Pita, MD  [facility] Direct Dial: [phone]   Fax: [phone] [email or web address]   Skype   LinkedIn

## 2019-03-06 NOTE — Progress Notes (Addendum)
STROKE TEAM PROGRESS NOTE     SUBJECTIVE (INTERVAL HISTORY) Her RN is at the bedside. Neuro exam stable, much improved aphasia. Blood pressure adequately controlled even without medications. Neruo Onc consulted as the MR w/gad shows ~5 intraparenchymal enhancing metastatic lesion the largest 1 located in the left posterior parietal lobe where there is associated hemorrhage.  As per discussion of neurooncology with neurosurgery patient is scheduled for brain biopsy of 1 of the lesions from Monday, 03/16/2019. she c/o of 8/10 h/a today and req Tylenol for this. She also tells me she is very religious and would appreciate pastorial care for prayer today.  OBJECTIVE Vitals:   03/05/19 1945 03/06/19 0137 03/06/19 0349 03/06/19 0810  BP: 106/70 101/61 (!) 95/59 111/69  Pulse: 74 63 70 90  Resp: 15 17 16 16   Temp: (!) 97.4 F (36.3 C) 97.8 F (36.6 C) 97.7 F (36.5 C) 98.3 F (36.8 C)  TempSrc: Oral Oral Oral Oral  SpO2: 93% 98% 96% 93%  Weight:      Height:        CBC:  Recent Labs  Lab 03/03/19 1352  WBC 9.8  NEUTROABS 8.8*  HGB 13.0  HCT 39.5  MCV 92.1  PLT 573    Basic Metabolic Panel:  Recent Labs  Lab 03/03/19 1352  NA 135  K 3.9  CL 95*  CO2 28  GLUCOSE 115*  BUN 11  CREATININE 0.53  CALCIUM 9.5    Lipid Panel:     Component Value Date/Time   CHOL 217 (H) 10/25/2014 1127   TRIG 86 10/25/2014 1127   HDL 73 10/25/2014 1127   CHOLHDL 3.0 10/25/2014 1127   VLDL 17 10/25/2014 1127   LDLCALC 127 (H) 10/25/2014 1127   HgbA1c: No results found for: HGBA1C Urine Drug Screen:     Component Value Date/Time   LABOPIA NONE DETECTED 03/06/2019 0332   COCAINSCRNUR NONE DETECTED 03/06/2019 0332   LABBENZ POSITIVE (A) 03/06/2019 0332   AMPHETMU NONE DETECTED 03/06/2019 0332   THCU NONE DETECTED 03/06/2019 0332   LABBARB NONE DETECTED 03/06/2019 0332    Alcohol Level     Component Value Date/Time   [facility] <10 03/03/2019 1352    IMAGING   Mr Brain W  Contrast  Result Date: 03/04/2019 CLINICAL DATA:  Intracranial hemorrhage.  History of lung cancer. EXAM: MRI HEAD WITH CONTRAST TECHNIQUE: Multiplanar, multiecho pulse sequences of the brain and surrounding structures were obtained with intravenous contrast. CONTRAST:  6 mL Gadavist COMPARISON:  Noncontrast MRI of the brain 03/03/2019 FINDINGS: Postcontrast T1-weighted imaging of the brain shows multiple intraparenchymal contrast-enhancing lesions. 1. Left cerebellum 5 x 5 mm, 2:40 2. Right temporal lobe 2 mm, 2:51 3. Peripheral contrast enhancement at the site of the known intraparenchymal hematoma in the posterior left parietal lobe, altogether measuring 3.7 x 2.9 cm, 2:87 4. Posterior left insula 5 x 5 mm, 2:91 5. Left frontal precentral gyrus 6 x 6 mm, 2:124 No contrast-enhancing lesions of the calvarium or extracranial soft tissues. IMPRESSION: 1. Five intraparenchymal metastatic lesions, the largest of which is located in the posterior left parietal lobe where the associated hemorrhage makes accurate measurement difficult. 2. Unchanged size of hematoma. Electronically Signed   By: Ulyses Jarred M.D.   On: 03/04/2019 17:57   Transthoracic Echocardiogram  02/22/2019 normal ejection fraction 60 to 65%.  No wall motion abnormalities.    PHYSICAL EXAM Blood pressure 111/69, pulse 90, temperature 98.3 F (36.8 C), temperature source Oral, resp. rate 16, height  5\' 5"  (1.651 m), weight 57 kg, SpO2 93 %.  Constitutional: Appears well-developed and well-nourished.  Elderly Caucasian lady not in distress Eyes: No scleral injection HENT: No OP obstrucion Head: Normocephalic.  Cardiovascular: Normal rate and regular rhythm.  Respiratory: Effort normal, non-labored breathing GI: Soft.  No distension. There is no tenderness.  Skin: WDI  Neuro: Mental Status: Alert, orient to name/dob/age/place. Still some trouble with date and naming city, but self corrects later. Follows commands briskly. Speech is  more fluent, but still with paraphasic errors.  Able to name objects. Cranial Nerves: II: Able to count fingers in all quadrants   III,IV, VI: EOMI without ptosis or diplopia. Pupils equal, round and reactive to light V: Facial sensation is symmetric VII: Facial movement is symmetric.  VIII: hearing is intact to voice X: Palate elevates symmetrically XI: Shoulder shrug is symmetric. XII: tongue is midline Motor:  moving all extremities with 5/5 strength.  Diminished fine finger movements on the right still noted somewhat Sensory: Intact to LT throughout Deep Tendon Reflexes: 2+ and symmetric in the biceps however no deep tendon reflexes at her patellae secondary to bilateral knee replacements Plantars: Toes are downgoing bilaterally.  Cerebellar: FNF and HKS are intact bilaterally Gait: didn't test  HOME MEDICATIONS:  Medications Prior to Admission  Medication Sig Dispense Refill  . acetaminophen (TYLENOL) 325 MG tablet Take 650 mg by mouth every 6 (six) hours as needed for moderate pain or headache.     . ALPRAZolam (XANAX) 0.5 MG tablet TAKE (1) TABLET BY MOUTH TWICE A DAY AS NEEDED. (Patient taking differently: Take 0.5 mg by mouth 2 (two) times daily as needed for anxiety. ) 60 tablet 2  . citalopram (CELEXA) 40 MG tablet TAKE (1/2) TABLET BY MOUTH AT BEDTIME. (Patient taking differently: Take 20 mg by mouth at bedtime. ) 45 tablet 3  . fluticasone (FLONASE) 50 MCG/ACT nasal spray Place 2 sprays into both nostrils daily. 16 g 6  . levothyroxine (SYNTHROID, LEVOTHROID) 50 MCG tablet TAKE 1 TABLET BEFORE BREAKFAST. (Patient taking differently: Take 50 mcg by mouth daily before breakfast. ) 90 tablet 0  . pantoprazole (PROTONIX) 40 MG tablet Take 1 tablet (40 mg total) by mouth daily before breakfast. 90 tablet 3  . umeclidinium-vilanterol (ANORO ELLIPTA) 62.5-25 MCG/INH AEPB Inhale 1 puff into the lungs daily. 90 each 3  . Calcium-Magnesium-Vitamin D (CITRACAL CALCIUM+D) 600-40-500  MG-MG-UNIT TB24 Take 1 tablet by mouth daily.    Marland Kitchen denosumab (PROLIA) 60 MG/ML SOSY injection Inject 60 mg into the skin every 6 (six) months. 1 Syringe 1  . ferrous sulfate 325 (65 FE) MG tablet Take 325 mg by mouth daily with breakfast.    . ibuprofen (ADVIL,MOTRIN) 200 MG tablet Take 200 mg by mouth every 6 (six) hours as needed for headache or mild pain.    . magnesium oxide (MAG-OX) 400 MG tablet Take 400 mg by mouth every evening.     . Multiple Vitamin (MULTIVITAMIN WITH MINERALS) TABS tablet Take 1 tablet by mouth daily.    Marland Kitchen omeprazole (PRILOSEC) 20 MG capsule 1 PO 30 MINS PRIOR TO BREAKFAST. (Patient taking differently: Take 20 mg by mouth daily. ) 90 capsule 3  . ondansetron (ZOFRAN ODT) 4 MG disintegrating tablet 4mg  ODT q4 hours prn nausea/vomit (Patient taking differently: Take 4 mg by mouth every 4 (four) hours as needed for nausea or vomiting. ) 6 tablet 0      HOSPITAL MEDICATIONS:  .  stroke: mapping our early stages  of recovery book   Does not apply Once  . Chlorhexidine Gluconate Cloth  6 each Topical Daily  . levothyroxine  50 mcg Oral QAC breakfast  . mouth rinse  15 mL Mouth Rinse BID  . pantoprazole (PROTONIX) IV  40 mg Intravenous QHS  . senna-docusate  1 tablet Oral BID    ALLERGIES Allergies  Allergen Reactions  . Ciprofloxacin Hcl Hives  . Iohexol Other (See Comments)     Desc: Per [facility], pt is allergic to IV contrast, no type of reaction was available. Pt cannot remember but first reacted around 15 yrs ago from an IVP. Notes were date from 2009 at urology center., Onset Date: [id number]   . Prozac [Fluoxetine Hcl] Rash  . Sulfonamide Derivatives Hives and Rash   ASSESSMENT/PLAN [name] is a 78 y.o. female with history of lung cancer, trigeminal neuralgia, on home O2, IBS, dysphagia, diastolic dysfunction, COPD, aortic aneurysm and anxiety presenting with left parietal/occipital ICH. Possible etiology is HTN, yet given her cancer history  MRI with contrast shows multiple enhancing metastatic lesions with hemorrhage into one such lesion as a cause of her intracerebral hemorrhage.   ICH-left parietal lobar hematoma with cytotoxic edema.  Etiology indeterminate amyloid versus underlying metastasis given prior history of lung cancer  Resultant  Mixed aphasia  CT head left parietal and occipital ICH  MRI head -stable IPH ~25cc volume; some surrounding vasogenic edema  MRI w/gad- Multiple underlying metastatic lesions. Neuro Onc consulted  MRA head -neg for underlying vascular process  MRI scan of the brain with contrast : At least 5 enhancing metastatic lesions with the largest one in the left posterior parietal at the location of the hemorrhage  2D Echo - neg for underlying process; left vert has some moderate stenosis noted  [employer] Virus 2 neg  LDL -n/a  HgbA1c - not done; no dm2 dx  UDS - not done  VTE prophylaxis - Contraindicated d/t bleed  Diet per SLP  No thinners or ASA prior to admit; currently not indicated  Ongoing aggressive stroke risk factor management  Therapy recommendations:  CIR denied; SNF now being sought out per case mgt  Disposition:  Pending  Hypertension  Stable . Long-term BP goal normotensive  Other Stroke Risk Factors  Advanced age  65 Cigarette smoker  Prev h/o lung cancer.    Hospital day # 3   The patient is neurologically improving with aphasia improved today but unfortunately the MRI scan of the brain with contrast shows multiple metastatic lesions. Neuro Oncology consult d/w Dr Mickeal Skinner yesterday evening; will do formal consult today. Once seen and cancer wk up and treatment plan can be devised, she will d/c to SNF for further rehab efforts. The patient's son Aaron Edelman has been informed about the MRI findings and answered questions.    [name], [name], [name] Pager: [phone] I have personally obtained history,examined this patient, reviewed  notes, independently viewed imaging studies, participated in medical decision making and plan of care.ROS completed by me personally and pertinent positives fully documented  I have made any additions or clarifications directly to the above note. Agree with note above. D/w Dr Kathyrn Sheriff and patient`s son.I have spent a total of   25 minutes with the patient reviewing hospital notes,  test results, labs and examining the patient as well as establishing an assessment and plan that was discussed personally with the patient.  > 50% of time was spent in direct patient care.  Ruth Contras, MD Medical Director [place] Pager: [phone] 03/06/2019 3:51 PM  To contact Stroke Continuity provider, please refer to [email or web address] After hours, contact General Neurology

## 2019-03-06 NOTE — Care Management Important Message (Signed)
Important Message  Patient Details  Name: [name] MRN: [id number] Date of Birth: 1941-03-18   Medicare Important Message Given:  Yes    Orbie Pyo 03/06/2019, 3:21 PM

## 2019-03-06 NOTE — Plan of Care (Signed)
Progressing towards goals

## 2019-03-06 NOTE — Progress Notes (Signed)
PT Cancellation Note  Patient Details Name: MARIANNA CID MRN: [id number] DOB: 11-May-1941   Cancelled Treatment:    Reason Eval/Treat Not Completed: Patient at procedure or test/unavailable Will follow up as schedule allows.   Leighton Ruff, PT, DPT  Acute Rehabilitation Services  Pager: [phone] Office: [phone]   Rudean Hitt 03/06/2019, 5:24 PM

## 2019-03-06 NOTE — Progress Notes (Signed)
Chaplain rec'd referral from nurse when she was on unit. Patient just informed she had brain cancer that was metastasized. Patient's demeanor was rather flat when chaplain visited, and also slightly tearful. "I don't want you to see me cry." Patient has one daughter, Dorian Pod, who died from cancer years ago, and twin sons Quillian Quince and Shanon Brow who live in driving proximity to her. She said she doesn't bother one son because his wife has medical problems. Patient lives alone in apartment with her rescue dog, Eduard Clos, a chihuahua. Eduard Clos appears to be her primary companion. Chaplain read Psalm 91 to patient. It is scripture she knows well and gains solace in. Patient was not wishing to talk about diagnosis but acknowledged headaches and difficulty in recall.  "Is this gonna get better?" she asked.  "Are you frustrated?" chaplain replied,  "No, I'm in shock." Patient also mentioned broken knee cap, and her plans to get it repaired. "I guess I don't need to worry about that now."  Doctor came in room at that time and chaplain said she would come again today if patient wants to see her again. Tamsen Snider Pager [phone]

## 2019-03-07 ENCOUNTER — Inpatient Hospital Stay (HOSPITAL_COMMUNITY): Payer: PPO

## 2019-03-07 DIAGNOSIS — C7931 Secondary malignant neoplasm of brain: Principal | ICD-10-CM

## 2019-03-07 DIAGNOSIS — I10 Essential (primary) hypertension: Secondary | ICD-10-CM

## 2019-03-07 DIAGNOSIS — Z85118 Personal history of other malignant neoplasm of bronchus and lung: Secondary | ICD-10-CM

## 2019-03-07 DIAGNOSIS — I361 Nonrheumatic tricuspid (valve) insufficiency: Secondary | ICD-10-CM

## 2019-03-07 LAB — ECHOCARDIOGRAM COMPLETE
Height: 65 in
Weight: 2010.6 oz

## 2019-03-07 MED ORDER — PANTOPRAZOLE SODIUM 40 MG PO TBEC
40.0000 mg | DELAYED_RELEASE_TABLET | Freq: Every day | ORAL | Status: DC
  Administered 2019-03-07 – 2019-03-12 (×6): 40 mg via ORAL
  Filled 2019-03-07 (×6): qty 1

## 2019-03-07 NOTE — Progress Notes (Signed)
STROKE TEAM PROGRESS NOTE     SUBJECTIVE (INTERVAL HISTORY) No family is at the bedside.  Speech much improved, near baseline. Patient is scheduled for brain biopsy of 1 of the lesions from Monday, 03/16/2019.   OBJECTIVE Vitals:   03/07/19 0320 03/07/19 0700 03/07/19 0834 03/07/19 1252  BP: 98/68 (!) 91/57 113/75 119/77  Pulse: 76 72 91 79  Resp: 17 16  16   Temp: 98.2 F (36.8 C) 98 F (36.7 C)  98.4 F (36.9 C)  TempSrc: Oral Oral  Oral  SpO2: 94% 95%  95%  Weight:      Height:        CBC:  Recent Labs  Lab 03/03/19 1352  WBC 9.8  NEUTROABS 8.8*  HGB 13.0  HCT 39.5  MCV 92.1  PLT 474    Basic Metabolic Panel:  Recent Labs  Lab 03/03/19 1352  NA 135  K 3.9  CL 95*  CO2 28  GLUCOSE 115*  BUN 11  CREATININE 0.53  CALCIUM 9.5    Lipid Panel:     Component Value Date/Time   CHOL 217 (H) 10/25/2014 1127   TRIG 86 10/25/2014 1127   HDL 73 10/25/2014 1127   CHOLHDL 3.0 10/25/2014 1127   VLDL 17 10/25/2014 1127   LDLCALC 127 (H) 10/25/2014 1127   HgbA1c: No results found for: HGBA1C Urine Drug Screen:     Component Value Date/Time   LABOPIA NONE DETECTED 03/06/2019 0332   COCAINSCRNUR NONE DETECTED 03/06/2019 0332   LABBENZ POSITIVE (A) 03/06/2019 0332   AMPHETMU NONE DETECTED 03/06/2019 0332   THCU NONE DETECTED 03/06/2019 0332   LABBARB NONE DETECTED 03/06/2019 0332    Alcohol Level     Component Value Date/Time   ETH <10 03/03/2019 1352    IMAGING  Ct Abdomen Pelvis Wo Contrast  Result Date: 03/06/2019 CLINICAL DATA:  Acute onset generalized pain today. EXAM: CT CHEST, ABDOMEN AND PELVIS WITHOUT CONTRAST TECHNIQUE: Multidetector CT imaging of the chest, abdomen and pelvis was performed following the standard protocol without IV contrast. COMPARISON:  CT chest 05/06/2018 and 05/09/2017. FINDINGS: CT CHEST FINDINGS Cardiovascular: Heart size is normal. No pericardial effusion. Calcific aortic atherosclerosis noted. Mediastinum/Nodes: No enlarged  mediastinal, hilar, or axillary lymph nodes. Thyroid gland, trachea, and esophagus demonstrate no significant findings. Lungs/Pleura: Extensive centrilobular emphysematous disease is identified. A 0.4 cm nodule in the superior segment of the right lower lobe on image 65 is unchanged. No new or enlarging pulmonary nodule is identified. There is some dependent airspace disease which is greater on the right. Suture material in the left lower lobe noted. Musculoskeletal: Severe T6 compression fracture and superior endplate compression fracture T8 are unchanged. Pedicle screws and stabilization bars from T10-L2 noted. Severe T12 and L1 compression fractures are unchanged. No new fracture identified. No lytic or sclerotic lesion. CT ABDOMEN PELVIS FINDINGS Hepatobiliary: No focal liver abnormality is seen. Status post cholecystectomy. No biliary dilatation. Pancreas: Unremarkable. No pancreatic ductal dilatation or surrounding inflammatory changes. Spleen: Normal in size without focal abnormality. Adrenals/Urinary Tract: Punctate parenchymal calcification the right kidney is unchanged. The kidneys otherwise appear. Ureters and urinary are unremarkable. Adrenal glands are. Stomach/Bowel: Stomach is within normal limits. Appendix appears normal. No evidence of bowel wall thickening, distention, or inflammatory changes. Scattered diverticulosis noted. Vascular/Lymphatic: Extensive atherosclerotic vascular disease is present. The descending abdominal aorta measures up to 3.4 cm in diameter and the right common iliac artery measures 1.4 cm. Reproductive: Status post hysterectomy. No adnexal masses. Other: None. Musculoskeletal:  There is a mild compression fracture deformity of L3 which appears remote. There is convex right lumbar scoliosis and multilevel degenerative change. Advanced right hip osteoarthritis is noted. IMPRESSION: Dependent bibasilar airspace disease is greater on the right and likely represents atelectasis but  could be secondary to pneumonia. No other finding to suggest acute abnormality is seen in the chest, abdomen or pelvis. Emphysema. Atherosclerosis. 3.4 cm abdominal aortic aneurysm is identified. Recommend followup by ultrasound in 3 years. This recommendation follows ACR consensus guidelines: White Paper of the ACR Incidental Findings Committee II on Vascular Findings. J Am Coll Radiol 2013; 10:789-794. Diverticulosis diverticulitis. Remote thoracic and lumbar spine compression fractures. Electronically Signed   By: Inge Rise M.D.   On: 03/06/2019 17:40   Ct Head Wo Contrast  Result Date: 03/04/2019 CLINICAL DATA:  Intracranial hemorrhage follow-up EXAM: CT HEAD WITHOUT CONTRAST TECHNIQUE: Contiguous axial images were obtained from the base of the skull through the vertex without intravenous contrast. COMPARISON:  Brain MRI from yesterday FINDINGS: Brain: Up to 4.2 Cm (craniocaudal) parenchymal hematoma in the left parietal lobe that is unchanged in extent when compared to brain MRI yesterday. There is a rim of edema that is also stable. Local swelling without midline shift. No hydrocephalus or extra-axial collection. No visible infarct Vascular: Atherosclerotic calcification Skull: No acute or aggressive finding Sinuses/Orbits: Bilateral cataract resection. IMPRESSION: Size stable left parietal hematoma with edema. No new abnormality or midline shift. Electronically Signed   By: Monte Fantasia M.D.   On: 03/04/2019 05:25   Ct Chest Wo Contrast  Result Date: 03/06/2019 CLINICAL DATA:  Acute onset generalized pain today. EXAM: CT CHEST, ABDOMEN AND PELVIS WITHOUT CONTRAST TECHNIQUE: Multidetector CT imaging of the chest, abdomen and pelvis was performed following the standard protocol without IV contrast. COMPARISON:  CT chest 05/06/2018 and 05/09/2017. FINDINGS: CT CHEST FINDINGS Cardiovascular: Heart size is normal. No pericardial effusion. Calcific aortic atherosclerosis noted. Mediastinum/Nodes:  No enlarged mediastinal, hilar, or axillary lymph nodes. Thyroid gland, trachea, and esophagus demonstrate no significant findings. Lungs/Pleura: Extensive centrilobular emphysematous disease is identified. A 0.4 cm nodule in the superior segment of the right lower lobe on image 65 is unchanged. No new or enlarging pulmonary nodule is identified. There is some dependent airspace disease which is greater on the right. Suture material in the left lower lobe noted. Musculoskeletal: Severe T6 compression fracture and superior endplate compression fracture T8 are unchanged. Pedicle screws and stabilization bars from T10-L2 noted. Severe T12 and L1 compression fractures are unchanged. No new fracture identified. No lytic or sclerotic lesion. CT ABDOMEN PELVIS FINDINGS Hepatobiliary: No focal liver abnormality is seen. Status post cholecystectomy. No biliary dilatation. Pancreas: Unremarkable. No pancreatic ductal dilatation or surrounding inflammatory changes. Spleen: Normal in size without focal abnormality. Adrenals/Urinary Tract: Punctate parenchymal calcification the right kidney is unchanged. The kidneys otherwise appear. Ureters and urinary are unremarkable. Adrenal glands are. Stomach/Bowel: Stomach is within normal limits. Appendix appears normal. No evidence of bowel wall thickening, distention, or inflammatory changes. Scattered diverticulosis noted. Vascular/Lymphatic: Extensive atherosclerotic vascular disease is present. The descending abdominal aorta measures up to 3.4 cm in diameter and the right common iliac artery measures 1.4 cm. Reproductive: Status post hysterectomy. No adnexal masses. Other: None. Musculoskeletal: There is a mild compression fracture deformity of L3 which appears remote. There is convex right lumbar scoliosis and multilevel degenerative change. Advanced right hip osteoarthritis is noted. IMPRESSION: Dependent bibasilar airspace disease is greater on the right and likely represents  atelectasis but could be  secondary to pneumonia. No other finding to suggest acute abnormality is seen in the chest, abdomen or pelvis. Emphysema. Atherosclerosis. 3.4 cm abdominal aortic aneurysm is identified. Recommend followup by ultrasound in 3 years. This recommendation follows ACR consensus guidelines: White Paper of the ACR Incidental Findings Committee II on Vascular Findings. J Am Coll Radiol 2013; 10:789-794. Diverticulosis diverticulitis. Remote thoracic and lumbar spine compression fractures. Electronically Signed   By: Inge Rise M.D.   On: 03/06/2019 17:40   Mr Virgel Paling HU Contrast  Result Date: 03/04/2019 CLINICAL DATA:  Initial evaluation for acute aphasia, right-sided weakness, confusion. EXAM: MRI HEAD WITHOUT CONTRAST MRA HEAD WITHOUT CONTRAST MRA NECK WITHOUT AND WITH CONTRAST TECHNIQUE: Multiplanar, multiecho pulse sequences of the brain and surrounding structures were obtained without intravenous contrast. Angiographic images of the Circle of Willis were obtained using MRA technique without intravenous contrast. Angiographic images of the neck were obtained using MRA technique without and with intravenous contrast. Carotid stenosis measurements (when applicable) are obtained utilizing NASCET criteria, using the distal internal carotid diameter as the denominator. CONTRAST:  5.7 cc Gadavist. COMPARISON:  Prior CT from earlier the same day. FINDINGS: MRI HEAD FINDINGS Cerebral volume within normal limits for age. Minimal T2/FLAIR hyperintensity noted within the periventricular white matter, nonspecific, felt to be within normal limits for age. Previously identified intraparenchymal hemorrhage centered at the left parietal lobe again seen, measuring 2.9 x 4.0 x 4.3 cm (estimated volume 25 cc). Surrounding vasogenic edema with mild regional mass effect. No midline shift. No intraventricular or extra-axial extension. No discernible underlying mass lesion or other abnormality. No  significant dot burden to suggest cerebral amyloid angiopathy. No findings to suggest overlying trauma. Etiology unclear. No other abnormal foci of restricted diffusion to suggest acute or subacute ischemia. Gray-white matter differentiation otherwise maintained. No encephalomalacia to suggest chronic cortical infarction. No other acute or chronic intracranial hemorrhage. No mass lesion or midline shift. No hydrocephalus. No extra-axial fluid collection. Pituitary gland suprasellar region normal. Midline structures intact. Major intracranial vascular flow voids maintained. Craniocervical junction normal. No focal marrow replacing lesion. Scalp soft tissues unremarkable. Patient status post bilateral ocular lens replacement. Paranasal sinuses are clear. Small left mastoid effusion noted, of doubtful significance. MRA HEAD FINDINGS ANTERIOR CIRCULATION: Examination moderately degraded by motion artifact. Distal cervical segments of the internal carotid arteries are patent with symmetric antegrade flow. Distal cervical left ICA tortuous. Petrous, cavernous, and supraclinoid segments patent without appreciable stenosis. A1 segments patent bilaterally. Anterior cerebral arteries patent to their distal aspects without definite stenosis. Anterior communicating artery not well assessed on this motion degraded exam. No M1 occlusion. Possible short-segment mild distal left M1 stenosis noted (series 1052, image 11). Normal MCA bifurcations. Distal MCA branches well perfused and grossly symmetric. No vascular abnormality seen underlying the partially visualized left parietal hematoma. POSTERIOR CIRCULATION: Vertebral arteries patent to the vertebrobasilar junction without stenosis. Vertebral arteries largely code dominant. Basilar widely patent to its distal aspect. Superior cerebral arteries patent bilaterally. Both of the posterior cerebral arteries primarily supplied via the basilar and are well perfused to their distal  aspects. No definite flow-limiting stenosis. Prominent left posterior communicating artery noted. No visible aneurysm or other vascular abnormality on this motion degraded exam. MRA NECK FINDINGS Examination technically limited by motion artifact. Visualized aortic arch of normal caliber with normal 3 vessel morphology. No flow-limiting stenosis about the origin of the great vessels. Visualized subclavian arteries widely patent. Right common carotid artery widely patent from its origin to the bifurcation. Mild  atheromatous irregularity about the right bifurcation without hemodynamically significant stenosis. Right ICA patent distally to the circle-of-Willis without stenosis or occlusion. Left common carotid artery patent from its origin to the bifurcation without stenosis. No significant atheromatous narrowing about the left bifurcation. Left ICA tortuous but patent to the circle-of-Willis without stenosis or occlusion. Both of the vertebral arteries arise from the subclavian arteries. Vertebral arteries largely code dominant. Probable short-segment moderate stenosis at the origin of the left vertebral artery (series 1084, image 14). Vertebral arteries otherwise widely patent within the neck without stenosis or occlusion. IMPRESSION: MRI HEAD IMPRESSION: 1. Stable size of acute intraparenchymal hemorrhage centered at the left parietal convexity, estimated volume 25 cc. Surrounding vasogenic edema with mild regional mass effect without midline shift. No identifiable etiology identified on this exam. 2. Otherwise normal brain MRI for age. MRA HEAD IMPRESSION: 1. Technically limited exam due to moderate motion degradation. 2. Grossly negative intracranial MRA. No large vessel occlusion, hemodynamically significant stenosis, or other acute vascular process. No vascular abnormality seen underlying the left parietal hemorrhage. MRA NECK IMPRESSION: 1. Wide patency of the carotid artery systems bilaterally within the neck.  2. Short-segment moderate stenosis at the origin of the left vertebral artery. Vertebral arteries otherwise widely patent within the neck. Vertebral arteries are co dominant. Electronically Signed   By: Jeannine Boga M.D.   On: 03/04/2019 01:21   Mr Jodene Nam Neck W Wo Contrast  Result Date: 03/04/2019 CLINICAL DATA:  Initial evaluation for acute aphasia, right-sided weakness, confusion. EXAM: MRI HEAD WITHOUT CONTRAST MRA HEAD WITHOUT CONTRAST MRA NECK WITHOUT AND WITH CONTRAST TECHNIQUE: Multiplanar, multiecho pulse sequences of the brain and surrounding structures were obtained without intravenous contrast. Angiographic images of the Circle of Willis were obtained using MRA technique without intravenous contrast. Angiographic images of the neck were obtained using MRA technique without and with intravenous contrast. Carotid stenosis measurements (when applicable) are obtained utilizing NASCET criteria, using the distal internal carotid diameter as the denominator. CONTRAST:  5.7 cc Gadavist. COMPARISON:  Prior CT from earlier the same day. FINDINGS: MRI HEAD FINDINGS Cerebral volume within normal limits for age. Minimal T2/FLAIR hyperintensity noted within the periventricular white matter, nonspecific, felt to be within normal limits for age. Previously identified intraparenchymal hemorrhage centered at the left parietal lobe again seen, measuring 2.9 x 4.0 x 4.3 cm (estimated volume 25 cc). Surrounding vasogenic edema with mild regional mass effect. No midline shift. No intraventricular or extra-axial extension. No discernible underlying mass lesion or other abnormality. No significant dot burden to suggest cerebral amyloid angiopathy. No findings to suggest overlying trauma. Etiology unclear. No other abnormal foci of restricted diffusion to suggest acute or subacute ischemia. Gray-white matter differentiation otherwise maintained. No encephalomalacia to suggest chronic cortical infarction. No other acute  or chronic intracranial hemorrhage. No mass lesion or midline shift. No hydrocephalus. No extra-axial fluid collection. Pituitary gland suprasellar region normal. Midline structures intact. Major intracranial vascular flow voids maintained. Craniocervical junction normal. No focal marrow replacing lesion. Scalp soft tissues unremarkable. Patient status post bilateral ocular lens replacement. Paranasal sinuses are clear. Small left mastoid effusion noted, of doubtful significance. MRA HEAD FINDINGS ANTERIOR CIRCULATION: Examination moderately degraded by motion artifact. Distal cervical segments of the internal carotid arteries are patent with symmetric antegrade flow. Distal cervical left ICA tortuous. Petrous, cavernous, and supraclinoid segments patent without appreciable stenosis. A1 segments patent bilaterally. Anterior cerebral arteries patent to their distal aspects without definite stenosis. Anterior communicating artery not well assessed on this motion degraded  exam. No M1 occlusion. Possible short-segment mild distal left M1 stenosis noted (series 1052, image 11). Normal MCA bifurcations. Distal MCA branches well perfused and grossly symmetric. No vascular abnormality seen underlying the partially visualized left parietal hematoma. POSTERIOR CIRCULATION: Vertebral arteries patent to the vertebrobasilar junction without stenosis. Vertebral arteries largely code dominant. Basilar widely patent to its distal aspect. Superior cerebral arteries patent bilaterally. Both of the posterior cerebral arteries primarily supplied via the basilar and are well perfused to their distal aspects. No definite flow-limiting stenosis. Prominent left posterior communicating artery noted. No visible aneurysm or other vascular abnormality on this motion degraded exam. MRA NECK FINDINGS Examination technically limited by motion artifact. Visualized aortic arch of normal caliber with normal 3 vessel morphology. No flow-limiting  stenosis about the origin of the great vessels. Visualized subclavian arteries widely patent. Right common carotid artery widely patent from its origin to the bifurcation. Mild atheromatous irregularity about the right bifurcation without hemodynamically significant stenosis. Right ICA patent distally to the circle-of-Willis without stenosis or occlusion. Left common carotid artery patent from its origin to the bifurcation without stenosis. No significant atheromatous narrowing about the left bifurcation. Left ICA tortuous but patent to the circle-of-Willis without stenosis or occlusion. Both of the vertebral arteries arise from the subclavian arteries. Vertebral arteries largely code dominant. Probable short-segment moderate stenosis at the origin of the left vertebral artery (series 1084, image 14). Vertebral arteries otherwise widely patent within the neck without stenosis or occlusion. IMPRESSION: MRI HEAD IMPRESSION: 1. Stable size of acute intraparenchymal hemorrhage centered at the left parietal convexity, estimated volume 25 cc. Surrounding vasogenic edema with mild regional mass effect without midline shift. No identifiable etiology identified on this exam. 2. Otherwise normal brain MRI for age. MRA HEAD IMPRESSION: 1. Technically limited exam due to moderate motion degradation. 2. Grossly negative intracranial MRA. No large vessel occlusion, hemodynamically significant stenosis, or other acute vascular process. No vascular abnormality seen underlying the left parietal hemorrhage. MRA NECK IMPRESSION: 1. Wide patency of the carotid artery systems bilaterally within the neck. 2. Short-segment moderate stenosis at the origin of the left vertebral artery. Vertebral arteries otherwise widely patent within the neck. Vertebral arteries are co dominant. Electronically Signed   By: Jeannine Boga M.D.   On: 03/04/2019 01:21   Mr Brain Wo Contrast  Result Date: 03/04/2019 CLINICAL DATA:  Initial evaluation  for acute aphasia, right-sided weakness, confusion. EXAM: MRI HEAD WITHOUT CONTRAST MRA HEAD WITHOUT CONTRAST MRA NECK WITHOUT AND WITH CONTRAST TECHNIQUE: Multiplanar, multiecho pulse sequences of the brain and surrounding structures were obtained without intravenous contrast. Angiographic images of the Circle of Willis were obtained using MRA technique without intravenous contrast. Angiographic images of the neck were obtained using MRA technique without and with intravenous contrast. Carotid stenosis measurements (when applicable) are obtained utilizing NASCET criteria, using the distal internal carotid diameter as the denominator. CONTRAST:  5.7 cc Gadavist. COMPARISON:  Prior CT from earlier the same day. FINDINGS: MRI HEAD FINDINGS Cerebral volume within normal limits for age. Minimal T2/FLAIR hyperintensity noted within the periventricular white matter, nonspecific, felt to be within normal limits for age. Previously identified intraparenchymal hemorrhage centered at the left parietal lobe again seen, measuring 2.9 x 4.0 x 4.3 cm (estimated volume 25 cc). Surrounding vasogenic edema with mild regional mass effect. No midline shift. No intraventricular or extra-axial extension. No discernible underlying mass lesion or other abnormality. No significant dot burden to suggest cerebral amyloid angiopathy. No findings to suggest overlying trauma. Etiology unclear.  No other abnormal foci of restricted diffusion to suggest acute or subacute ischemia. Gray-white matter differentiation otherwise maintained. No encephalomalacia to suggest chronic cortical infarction. No other acute or chronic intracranial hemorrhage. No mass lesion or midline shift. No hydrocephalus. No extra-axial fluid collection. Pituitary gland suprasellar region normal. Midline structures intact. Major intracranial vascular flow voids maintained. Craniocervical junction normal. No focal marrow replacing lesion. Scalp soft tissues unremarkable.  Patient status post bilateral ocular lens replacement. Paranasal sinuses are clear. Small left mastoid effusion noted, of doubtful significance. MRA HEAD FINDINGS ANTERIOR CIRCULATION: Examination moderately degraded by motion artifact. Distal cervical segments of the internal carotid arteries are patent with symmetric antegrade flow. Distal cervical left ICA tortuous. Petrous, cavernous, and supraclinoid segments patent without appreciable stenosis. A1 segments patent bilaterally. Anterior cerebral arteries patent to their distal aspects without definite stenosis. Anterior communicating artery not well assessed on this motion degraded exam. No M1 occlusion. Possible short-segment mild distal left M1 stenosis noted (series 1052, image 11). Normal MCA bifurcations. Distal MCA branches well perfused and grossly symmetric. No vascular abnormality seen underlying the partially visualized left parietal hematoma. POSTERIOR CIRCULATION: Vertebral arteries patent to the vertebrobasilar junction without stenosis. Vertebral arteries largely code dominant. Basilar widely patent to its distal aspect. Superior cerebral arteries patent bilaterally. Both of the posterior cerebral arteries primarily supplied via the basilar and are well perfused to their distal aspects. No definite flow-limiting stenosis. Prominent left posterior communicating artery noted. No visible aneurysm or other vascular abnormality on this motion degraded exam. MRA NECK FINDINGS Examination technically limited by motion artifact. Visualized aortic arch of normal caliber with normal 3 vessel morphology. No flow-limiting stenosis about the origin of the great vessels. Visualized subclavian arteries widely patent. Right common carotid artery widely patent from its origin to the bifurcation. Mild atheromatous irregularity about the right bifurcation without hemodynamically significant stenosis. Right ICA patent distally to the circle-of-Willis without stenosis  or occlusion. Left common carotid artery patent from its origin to the bifurcation without stenosis. No significant atheromatous narrowing about the left bifurcation. Left ICA tortuous but patent to the circle-of-Willis without stenosis or occlusion. Both of the vertebral arteries arise from the subclavian arteries. Vertebral arteries largely code dominant. Probable short-segment moderate stenosis at the origin of the left vertebral artery (series 1084, image 14). Vertebral arteries otherwise widely patent within the neck without stenosis or occlusion. IMPRESSION: MRI HEAD IMPRESSION: 1. Stable size of acute intraparenchymal hemorrhage centered at the left parietal convexity, estimated volume 25 cc. Surrounding vasogenic edema with mild regional mass effect without midline shift. No identifiable etiology identified on this exam. 2. Otherwise normal brain MRI for age. MRA HEAD IMPRESSION: 1. Technically limited exam due to moderate motion degradation. 2. Grossly negative intracranial MRA. No large vessel occlusion, hemodynamically significant stenosis, or other acute vascular process. No vascular abnormality seen underlying the left parietal hemorrhage. MRA NECK IMPRESSION: 1. Wide patency of the carotid artery systems bilaterally within the neck. 2. Short-segment moderate stenosis at the origin of the left vertebral artery. Vertebral arteries otherwise widely patent within the neck. Vertebral arteries are co dominant. Electronically Signed   By: Jeannine Boga M.D.   On: 03/04/2019 01:21   Mr Brain W Contrast  Result Date: 03/04/2019 CLINICAL DATA:  Intracranial hemorrhage.  History of lung cancer. EXAM: MRI HEAD WITH CONTRAST TECHNIQUE: Multiplanar, multiecho pulse sequences of the brain and surrounding structures were obtained with intravenous contrast. CONTRAST:  6 mL Gadavist COMPARISON:  Noncontrast MRI of the brain 03/03/2019 FINDINGS:  Postcontrast T1-weighted imaging of the brain shows multiple  intraparenchymal contrast-enhancing lesions. 1. Left cerebellum 5 x 5 mm, 2:40 2. Right temporal lobe 2 mm, 2:51 3. Peripheral contrast enhancement at the site of the known intraparenchymal hematoma in the posterior left parietal lobe, altogether measuring 3.7 x 2.9 cm, 2:87 4. Posterior left insula 5 x 5 mm, 2:91 5. Left frontal precentral gyrus 6 x 6 mm, 2:124 No contrast-enhancing lesions of the calvarium or extracranial soft tissues. IMPRESSION: 1. Five intraparenchymal metastatic lesions, the largest of which is located in the posterior left parietal lobe where the associated hemorrhage makes accurate measurement difficult. 2. Unchanged size of hematoma. Electronically Signed   By: Ulyses Jarred M.D.   On: 03/04/2019 17:57   Ct Head Code Stroke Wo Contrast  Result Date: 03/03/2019 CLINICAL DATA:  Code stroke. Acute onset of aphasia and right-sided weakness. EXAM: CT HEAD WITHOUT CONTRAST TECHNIQUE: Contiguous axial images were obtained from the base of the skull through the vertex without intravenous contrast. COMPARISON:  CT head without contrast 09/11/2016 FINDINGS: Brain: An acute left parietal and occipital hemorrhage measures 3.5 x 3.7 x 3.0 cm. There is surrounding edema with effacement of the adjacent sulci and minimal mass effect on the posterior horn of the left lateral ventricle. 3 mm midline shift is present. No other focal hemorrhage is present. No other mass lesion or acute infarct is evident. Basal ganglia are intact. Insular ribbon is normal. No significant extra-axial fluid collection is present. Vascular: Minimal calcifications are present. There is no hyperdense vessel. Skull: Calvarium is intact. No focal lytic or blastic lesions are present. Sinuses/Orbits: The paranasal sinuses and mastoid air cells are clear. Bilateral lens replacements are noted. Globes and orbits are otherwise unremarkable. IMPRESSION: 1. Left parietal and occipital parenchymal hematoma measuring up to 3.7 cm. This  is nonspecific. It is most likely related to hypertension or underlying vasculitis in a patient of this age. The differential diagnosis includes metastatic disease or other vascular malformation. No other lesions are evident. 2. Surrounding edema and mass effect is mostly local with minimal midline shift of 3-4 mm. 3. No other focal lesions or hemorrhage. Critical Value/emergent results were called by telephone at the time of interpretation on 03/03/2019 at 1:39 pm to Dr. Gerlene Fee , who verbally acknowledged these results. Electronically Signed   By: San Morelle M.D.   On: 03/03/2019 13:46   Transthoracic Echocardiogram  02/22/2019 normal ejection fraction 60 to 65%.  No wall motion abnormalities.    PHYSICAL EXAM  Temp:  [97.6 F (36.4 C)-98.4 F (36.9 C)] 98.2 F (36.8 C) (05/30 1611) Pulse Rate:  [72-93] 93 (05/30 1611) Resp:  [16-20] 20 (05/30 1611) BP: (91-119)/(57-82) 110/62 (05/30 1611) SpO2:  [92 %-95 %] 92 % (05/30 1611)  General - Well nourished, well developed, in no apparent distress.  Ophthalmologic - fundi not visualized due to noncooperation.  Cardiovascular - Regular rate and rhythm.  Mental Status -  Level of arousal and orientation to time, place, and person were intact. Language including expression, naming, repetition, comprehension was assessed and found intact.  Cranial Nerves II - XII - II - Visual field intact OU. III, IV, VI - Extraocular movements intact. V - Facial sensation intact bilaterally. VII - Facial movement intact bilaterally. VIII - Hearing & vestibular intact bilaterally. X - Palate elevates symmetrically. XI - Chin turning & shoulder shrug intact bilaterally. XII - Tongue protrusion intact.  Motor Strength - The patient's strength was normal in all extremities  and pronator drift was absent.  Bulk was normal and fasciculations were absent.   Motor Tone - Muscle tone was assessed at the neck and appendages and was  normal.  Reflexes - The patient's reflexes were symmetrical in all extremities and she had no pathological reflexes.  Sensory - Light touch, temperature/pinprick were assessed and were symmetrical.    Coordination - The patient had normal movements in the hands with no ataxia or dysmetria.  Tremor was absent  Gait and Station - deferred.    ASSESSMENT/PLAN Ms. [name] is a 78 y.o. female with history of lung cancer, trigeminal neuralgia, on home O2, IBS, dysphagia, diastolic dysfunction, COPD, aortic aneurysm and anxiety presenting with left parietal/occipital ICH. Possible etiology is HTN, yet given her cancer history MRI with contrast shows multiple enhancing metastatic lesions with hemorrhage into one such lesion as a cause of her intracerebral hemorrhage. Neuro Oncology consult.  ICH - left parietal lobar hematoma - Etiology indeterminate, CAA vs underlying metastatic hemorrhage given prior history of lung cancer  CT head left parietal and occipital ICH  MRI head -stable IPH ~25cc volume; some surrounding vasogenic edema, no evidence of typical CAA  MRA head - neg for underlying vascular process  MRI scan of the brain with contrast : At least 5 enhancing metastatic lesions with the largest one in the left posterior parietal at the location of the hemorrhage  2D Echo - EF 60 to 65%  [employer] Virus 2 neg  LDL pending  HgbA1c - pending  VTE prophylaxis - SCDs  No thinners or ASA prior to admit; currently not indicated  Ongoing aggressive stroke risk factor management  Therapy recommendations:  SNF  Disposition:  Pending  Brain metastasis  History of lung cancer  MRI w/gad - 5 small/punctate enhancement underlying metastatic lesions.   Neuro Onc consulted with plan for biopsy on Monday, 03/16/2019.   CT chest/abdomen/pelvis showed right more than left airspace disease, no metastasis  Hypertension  Stable  BP goal < 160 . 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 Recommend followup by ultrasound in 3 years.   COPD on home oxygen  Trigeminal neuralgia   Hospital day # 4   Rosalin Hawking, MD PhD Stroke Neurology 03/07/2019 4:25 PM    To contact Stroke Continuity provider, please refer to [email or web address] After hours, contact General Neurology

## 2019-03-07 NOTE — Progress Notes (Signed)
2D Echocardiogram has been performed.  [name] 03/07/2019, 9:53 AM

## 2019-03-07 NOTE — Progress Notes (Signed)
CSW called the patient's son Quillian Quince. He stated that he had not spoken with his dad or his brother about a SNF placement yet. He stated that he will be speaking with them today and would call the CSW either this afternoon or Sunday.   CSW thanked him and told him she would be expecting his call.   Domenic Schwab, MSW, [place]

## 2019-03-08 LAB — BASIC METABOLIC PANEL
Anion gap: 9 (ref 5–15)
BUN: 9 mg/dL (ref 8–23)
CO2: 28 mmol/L (ref 22–32)
Calcium: 9.7 mg/dL (ref 8.9–10.3)
Chloride: 97 mmol/L — ABNORMAL LOW (ref 98–111)
Creatinine, Ser: 0.54 mg/dL (ref 0.44–1.00)
GFR calc Af Amer: >60 mL/min (ref >60)
GFR calc non Af Amer: >60 mL/min (ref >60)
Glucose, Bld: 90 mg/dL (ref 70–99)
Potassium: 3.9 mmol/L (ref 3.5–5.1)
Sodium: 134 mmol/L — ABNORMAL LOW (ref 135–145)

## 2019-03-08 LAB — CBC
HCT: 39.3 % (ref 36.0–46.0)
Hemoglobin: 12.9 g/dL (ref 12.0–15.0)
MCH: 29.5 pg (ref 26.0–34.0)
MCHC: 32.8 g/dL (ref 30.0–36.0)
MCV: 89.9 fL (ref 80.0–100.0)
Platelets: 332 10*3/uL (ref 150–400)
RBC: 4.37 MIL/uL (ref 3.87–5.11)
RDW: 12.2 % (ref 11.5–15.5)
WBC: 6.3 10*3/uL (ref 4.0–10.5)
nRBC: 0 % (ref 0.0–0.2)

## 2019-03-08 LAB — LIPID PANEL
Cholesterol: 220 mg/dL — ABNORMAL HIGH (ref 0–200)
HDL: 54 mg/dL (ref >40)
LDL Cholesterol: 155 mg/dL — ABNORMAL HIGH (ref 0–99)
Total CHOL/HDL Ratio: 4.1 RATIO
Triglycerides: 57 mg/dL (ref <150)
VLDL: 11 mg/dL (ref 0–40)

## 2019-03-08 LAB — HEMOGLOBIN A1C
Hgb A1c MFr Bld: 5.1 % (ref 4.8–5.6)
Mean Plasma Glucose: 99.67 mg/dL

## 2019-03-08 MED ORDER — UMECLIDINIUM-VILANTEROL 62.5-25 MCG/INH IN AEPB
1.0000 | INHALATION_SPRAY | Freq: Every day | RESPIRATORY_TRACT | Status: DC
  Administered 2019-03-08 – 2019-03-11 (×4): 1 via RESPIRATORY_TRACT
  Filled 2019-03-08: qty 14

## 2019-03-08 NOTE — Progress Notes (Signed)
STROKE TEAM PROGRESS NOTE     SUBJECTIVE (INTERVAL HISTORY) No family is at the bedside.  Speech much improved, near baseline. Patient is scheduled for brain biopsy of 1 of the lesions from Monday, 03/16/2019.   OBJECTIVE Vitals:   03/07/19 1611 03/07/19 1953 03/08/19 0017 03/08/19 0337  BP: 110/62 104/66 100/68 98/70  Pulse: 93 78 68 69  Resp: 20 12 11 15   Temp: 98.2 F (36.8 C) 97.7 F (36.5 C) 97.9 F (36.6 C) 98 F (36.7 C)  TempSrc: Oral Oral Oral Oral  SpO2: 92% 95% 97% 95%  Weight:      Height:        CBC:  Recent Labs  Lab 03/03/19 1352 03/08/19 0426  WBC 9.8 6.3  NEUTROABS 8.8*  --   HGB 13.0 12.9  HCT 39.5 39.3  MCV 92.1 89.9  PLT 298 035    Basic Metabolic Panel:  Recent Labs  Lab 03/03/19 1352 03/08/19 0426  NA 135 134*  K 3.9 3.9  CL 95* 97*  CO2 28 28  GLUCOSE 115* 90  BUN 11 9  CREATININE 0.53 0.54  CALCIUM 9.5 9.7    Lipid Panel:     Component Value Date/Time   CHOL 220 (H) 03/08/2019 0426   TRIG 57 03/08/2019 0426   HDL 54 03/08/2019 0426   CHOLHDL 4.1 03/08/2019 0426   VLDL 11 03/08/2019 0426   LDLCALC 155 (H) 03/08/2019 0426   HgbA1c:  Lab Results  Component Value Date   HGBA1C 5.1 03/08/2019   Urine Drug Screen:     Component Value Date/Time   LABOPIA NONE DETECTED 03/06/2019 0332   COCAINSCRNUR NONE DETECTED 03/06/2019 0332   LABBENZ POSITIVE (A) 03/06/2019 0332   AMPHETMU NONE DETECTED 03/06/2019 0332   THCU NONE DETECTED 03/06/2019 0332   LABBARB NONE DETECTED 03/06/2019 0332    Alcohol Level     Component Value Date/Time   ETH <10 03/03/2019 1352    IMAGING  Ct Abdomen Pelvis Wo Contrast  Result Date: 03/06/2019 CLINICAL DATA:  Acute onset generalized pain today. EXAM: CT CHEST, ABDOMEN AND PELVIS WITHOUT CONTRAST TECHNIQUE: Multidetector CT imaging of the chest, abdomen and pelvis was performed following the standard protocol without IV contrast. COMPARISON:  CT chest 05/06/2018 and 05/09/2017. FINDINGS: CT  CHEST FINDINGS Cardiovascular: Heart size is normal. No pericardial effusion. Calcific aortic atherosclerosis noted. Mediastinum/Nodes: No enlarged mediastinal, hilar, or axillary lymph nodes. Thyroid gland, trachea, and esophagus demonstrate no significant findings. Lungs/Pleura: Extensive centrilobular emphysematous disease is identified. A 0.4 cm nodule in the superior segment of the right lower lobe on image 65 is unchanged. No new or enlarging pulmonary nodule is identified. There is some dependent airspace disease which is greater on the right. Suture material in the left lower lobe noted. Musculoskeletal: Severe T6 compression fracture and superior endplate compression fracture T8 are unchanged. Pedicle screws and stabilization bars from T10-L2 noted. Severe T12 and L1 compression fractures are unchanged. No new fracture identified. No lytic or sclerotic lesion. CT ABDOMEN PELVIS FINDINGS Hepatobiliary: No focal liver abnormality is seen. Status post cholecystectomy. No biliary dilatation. Pancreas: Unremarkable. No pancreatic ductal dilatation or surrounding inflammatory changes. Spleen: Normal in size without focal abnormality. Adrenals/Urinary Tract: Punctate parenchymal calcification the right kidney is unchanged. The kidneys otherwise appear. Ureters and urinary are unremarkable. Adrenal glands are. Stomach/Bowel: Stomach is within normal limits. Appendix appears normal. No evidence of bowel wall thickening, distention, or inflammatory changes. Scattered diverticulosis noted. Vascular/Lymphatic: Extensive atherosclerotic vascular disease is  present. The descending abdominal aorta measures up to 3.4 cm in diameter and the right common iliac artery measures 1.4 cm. Reproductive: Status post hysterectomy. No adnexal masses. Other: None. Musculoskeletal: There is a mild compression fracture deformity of L3 which appears remote. There is convex right lumbar scoliosis and multilevel degenerative change.  Advanced right hip osteoarthritis is noted. IMPRESSION: Dependent bibasilar airspace disease is greater on the right and likely represents atelectasis but could be secondary to pneumonia. No other finding to suggest acute abnormality is seen in the chest, abdomen or pelvis. Emphysema. Atherosclerosis. 3.4 cm abdominal aortic aneurysm is identified. Recommend followup by ultrasound in 3 years. This recommendation follows ACR consensus guidelines: White Paper of the ACR Incidental Findings Committee II on Vascular Findings. J Am Coll Radiol 2013; 10:789-794. Diverticulosis diverticulitis. Remote thoracic and lumbar spine compression fractures. Electronically Signed   By: Inge Rise M.D.   On: 03/06/2019 17:40   Ct Head Wo Contrast  Result Date: 03/04/2019 CLINICAL DATA:  Intracranial hemorrhage follow-up EXAM: CT HEAD WITHOUT CONTRAST TECHNIQUE: Contiguous axial images were obtained from the base of the skull through the vertex without intravenous contrast. COMPARISON:  Brain MRI from yesterday FINDINGS: Brain: Up to 4.2 Cm (craniocaudal) parenchymal hematoma in the left parietal lobe that is unchanged in extent when compared to brain MRI yesterday. There is a rim of edema that is also stable. Local swelling without midline shift. No hydrocephalus or extra-axial collection. No visible infarct Vascular: Atherosclerotic calcification Skull: No acute or aggressive finding Sinuses/Orbits: Bilateral cataract resection. IMPRESSION: Size stable left parietal hematoma with edema. No new abnormality or midline shift. Electronically Signed   By: Monte Fantasia M.D.   On: 03/04/2019 05:25   Ct Chest Wo Contrast  Result Date: 03/06/2019 CLINICAL DATA:  Acute onset generalized pain today. EXAM: CT CHEST, ABDOMEN AND PELVIS WITHOUT CONTRAST TECHNIQUE: Multidetector CT imaging of the chest, abdomen and pelvis was performed following the standard protocol without IV contrast. COMPARISON:  CT chest 05/06/2018 and  05/09/2017. FINDINGS: CT CHEST FINDINGS Cardiovascular: Heart size is normal. No pericardial effusion. Calcific aortic atherosclerosis noted. Mediastinum/Nodes: No enlarged mediastinal, hilar, or axillary lymph nodes. Thyroid gland, trachea, and esophagus demonstrate no significant findings. Lungs/Pleura: Extensive centrilobular emphysematous disease is identified. A 0.4 cm nodule in the superior segment of the right lower lobe on image 65 is unchanged. No new or enlarging pulmonary nodule is identified. There is some dependent airspace disease which is greater on the right. Suture material in the left lower lobe noted. Musculoskeletal: Severe T6 compression fracture and superior endplate compression fracture T8 are unchanged. Pedicle screws and stabilization bars from T10-L2 noted. Severe T12 and L1 compression fractures are unchanged. No new fracture identified. No lytic or sclerotic lesion. CT ABDOMEN PELVIS FINDINGS Hepatobiliary: No focal liver abnormality is seen. Status post cholecystectomy. No biliary dilatation. Pancreas: Unremarkable. No pancreatic ductal dilatation or surrounding inflammatory changes. Spleen: Normal in size without focal abnormality. Adrenals/Urinary Tract: Punctate parenchymal calcification the right kidney is unchanged. The kidneys otherwise appear. Ureters and urinary are unremarkable. Adrenal glands are. Stomach/Bowel: Stomach is within normal limits. Appendix appears normal. No evidence of bowel wall thickening, distention, or inflammatory changes. Scattered diverticulosis noted. Vascular/Lymphatic: Extensive atherosclerotic vascular disease is present. The descending abdominal aorta measures up to 3.4 cm in diameter and the right common iliac artery measures 1.4 cm. Reproductive: Status post hysterectomy. No adnexal masses. Other: None. Musculoskeletal: There is a mild compression fracture deformity of L3 which appears remote. There is convex  right lumbar scoliosis and multilevel  degenerative change. Advanced right hip osteoarthritis is noted. IMPRESSION: Dependent bibasilar airspace disease is greater on the right and likely represents atelectasis but could be secondary to pneumonia. No other finding to suggest acute abnormality is seen in the chest, abdomen or pelvis. Emphysema. Atherosclerosis. 3.4 cm abdominal aortic aneurysm is identified. Recommend followup by ultrasound in 3 years. This recommendation follows ACR consensus guidelines: White Paper of the ACR Incidental Findings Committee II on Vascular Findings. J Am Coll Radiol 2013; 10:789-794. Diverticulosis diverticulitis. Remote thoracic and lumbar spine compression fractures. Electronically Signed   By: Inge Rise M.D.   On: 03/06/2019 17:40   Mr Virgel Paling XV Contrast  Result Date: 03/04/2019 CLINICAL DATA:  Initial evaluation for acute aphasia, right-sided weakness, confusion. EXAM: MRI HEAD WITHOUT CONTRAST MRA HEAD WITHOUT CONTRAST MRA NECK WITHOUT AND WITH CONTRAST TECHNIQUE: Multiplanar, multiecho pulse sequences of the brain and surrounding structures were obtained without intravenous contrast. Angiographic images of the Circle of Willis were obtained using MRA technique without intravenous contrast. Angiographic images of the neck were obtained using MRA technique without and with intravenous contrast. Carotid stenosis measurements (when applicable) are obtained utilizing NASCET criteria, using the distal internal carotid diameter as the denominator. CONTRAST:  5.7 cc Gadavist. COMPARISON:  Prior CT from earlier the same day. FINDINGS: MRI HEAD FINDINGS Cerebral volume within normal limits for age. Minimal T2/FLAIR hyperintensity noted within the periventricular white matter, nonspecific, felt to be within normal limits for age. Previously identified intraparenchymal hemorrhage centered at the left parietal lobe again seen, measuring 2.9 x 4.0 x 4.3 cm (estimated volume 25 cc). Surrounding vasogenic edema with  mild regional mass effect. No midline shift. No intraventricular or extra-axial extension. No discernible underlying mass lesion or other abnormality. No significant dot burden to suggest cerebral amyloid angiopathy. No findings to suggest overlying trauma. Etiology unclear. No other abnormal foci of restricted diffusion to suggest acute or subacute ischemia. Gray-white matter differentiation otherwise maintained. No encephalomalacia to suggest chronic cortical infarction. No other acute or chronic intracranial hemorrhage. No mass lesion or midline shift. No hydrocephalus. No extra-axial fluid collection. Pituitary gland suprasellar region normal. Midline structures intact. Major intracranial vascular flow voids maintained. Craniocervical junction normal. No focal marrow replacing lesion. Scalp soft tissues unremarkable. Patient status post bilateral ocular lens replacement. Paranasal sinuses are clear. Small left mastoid effusion noted, of doubtful significance. MRA HEAD FINDINGS ANTERIOR CIRCULATION: Examination moderately degraded by motion artifact. Distal cervical segments of the internal carotid arteries are patent with symmetric antegrade flow. Distal cervical left ICA tortuous. Petrous, cavernous, and supraclinoid segments patent without appreciable stenosis. A1 segments patent bilaterally. Anterior cerebral arteries patent to their distal aspects without definite stenosis. Anterior communicating artery not well assessed on this motion degraded exam. No M1 occlusion. Possible short-segment mild distal left M1 stenosis noted (series 1052, image 11). Normal MCA bifurcations. Distal MCA branches well perfused and grossly symmetric. No vascular abnormality seen underlying the partially visualized left parietal hematoma. POSTERIOR CIRCULATION: Vertebral arteries patent to the vertebrobasilar junction without stenosis. Vertebral arteries largely code dominant. Basilar widely patent to its distal aspect. Superior  cerebral arteries patent bilaterally. Both of the posterior cerebral arteries primarily supplied via the basilar and are well perfused to their distal aspects. No definite flow-limiting stenosis. Prominent left posterior communicating artery noted. No visible aneurysm or other vascular abnormality on this motion degraded exam. MRA NECK FINDINGS Examination technically limited by motion artifact. Visualized aortic arch of normal caliber with normal  3 vessel morphology. No flow-limiting stenosis about the origin of the great vessels. Visualized subclavian arteries widely patent. Right common carotid artery widely patent from its origin to the bifurcation. Mild atheromatous irregularity about the right bifurcation without hemodynamically significant stenosis. Right ICA patent distally to the circle-of-Willis without stenosis or occlusion. Left common carotid artery patent from its origin to the bifurcation without stenosis. No significant atheromatous narrowing about the left bifurcation. Left ICA tortuous but patent to the circle-of-Willis without stenosis or occlusion. Both of the vertebral arteries arise from the subclavian arteries. Vertebral arteries largely code dominant. Probable short-segment moderate stenosis at the origin of the left vertebral artery (series 1084, image 14). Vertebral arteries otherwise widely patent within the neck without stenosis or occlusion. IMPRESSION: MRI HEAD IMPRESSION: 1. Stable size of acute intraparenchymal hemorrhage centered at the left parietal convexity, estimated volume 25 cc. Surrounding vasogenic edema with mild regional mass effect without midline shift. No identifiable etiology identified on this exam. 2. Otherwise normal brain MRI for age. MRA HEAD IMPRESSION: 1. Technically limited exam due to moderate motion degradation. 2. Grossly negative intracranial MRA. No large vessel occlusion, hemodynamically significant stenosis, or other acute vascular process. No vascular  abnormality seen underlying the left parietal hemorrhage. MRA NECK IMPRESSION: 1. Wide patency of the carotid artery systems bilaterally within the neck. 2. Short-segment moderate stenosis at the origin of the left vertebral artery. Vertebral arteries otherwise widely patent within the neck. Vertebral arteries are co dominant. Electronically Signed   By: Jeannine Boga M.D.   On: 03/04/2019 01:21   Mr Jodene Nam Neck W Wo Contrast  Result Date: 03/04/2019 CLINICAL DATA:  Initial evaluation for acute aphasia, right-sided weakness, confusion. EXAM: MRI HEAD WITHOUT CONTRAST MRA HEAD WITHOUT CONTRAST MRA NECK WITHOUT AND WITH CONTRAST TECHNIQUE: Multiplanar, multiecho pulse sequences of the brain and surrounding structures were obtained without intravenous contrast. Angiographic images of the Circle of Willis were obtained using MRA technique without intravenous contrast. Angiographic images of the neck were obtained using MRA technique without and with intravenous contrast. Carotid stenosis measurements (when applicable) are obtained utilizing NASCET criteria, using the distal internal carotid diameter as the denominator. CONTRAST:  5.7 cc Gadavist. COMPARISON:  Prior CT from earlier the same day. FINDINGS: MRI HEAD FINDINGS Cerebral volume within normal limits for age. Minimal T2/FLAIR hyperintensity noted within the periventricular white matter, nonspecific, felt to be within normal limits for age. Previously identified intraparenchymal hemorrhage centered at the left parietal lobe again seen, measuring 2.9 x 4.0 x 4.3 cm (estimated volume 25 cc). Surrounding vasogenic edema with mild regional mass effect. No midline shift. No intraventricular or extra-axial extension. No discernible underlying mass lesion or other abnormality. No significant dot burden to suggest cerebral amyloid angiopathy. No findings to suggest overlying trauma. Etiology unclear. No other abnormal foci of restricted diffusion to suggest acute  or subacute ischemia. Gray-white matter differentiation otherwise maintained. No encephalomalacia to suggest chronic cortical infarction. No other acute or chronic intracranial hemorrhage. No mass lesion or midline shift. No hydrocephalus. No extra-axial fluid collection. Pituitary gland suprasellar region normal. Midline structures intact. Major intracranial vascular flow voids maintained. Craniocervical junction normal. No focal marrow replacing lesion. Scalp soft tissues unremarkable. Patient status post bilateral ocular lens replacement. Paranasal sinuses are clear. Small left mastoid effusion noted, of doubtful significance. MRA HEAD FINDINGS ANTERIOR CIRCULATION: Examination moderately degraded by motion artifact. Distal cervical segments of the internal carotid arteries are patent with symmetric antegrade flow. Distal cervical left ICA tortuous. Petrous, cavernous, and  supraclinoid segments patent without appreciable stenosis. A1 segments patent bilaterally. Anterior cerebral arteries patent to their distal aspects without definite stenosis. Anterior communicating artery not well assessed on this motion degraded exam. No M1 occlusion. Possible short-segment mild distal left M1 stenosis noted (series 1052, image 11). Normal MCA bifurcations. Distal MCA branches well perfused and grossly symmetric. No vascular abnormality seen underlying the partially visualized left parietal hematoma. POSTERIOR CIRCULATION: Vertebral arteries patent to the vertebrobasilar junction without stenosis. Vertebral arteries largely code dominant. Basilar widely patent to its distal aspect. Superior cerebral arteries patent bilaterally. Both of the posterior cerebral arteries primarily supplied via the basilar and are well perfused to their distal aspects. No definite flow-limiting stenosis. Prominent left posterior communicating artery noted. No visible aneurysm or other vascular abnormality on this motion degraded exam. MRA NECK  FINDINGS Examination technically limited by motion artifact. Visualized aortic arch of normal caliber with normal 3 vessel morphology. No flow-limiting stenosis about the origin of the great vessels. Visualized subclavian arteries widely patent. Right common carotid artery widely patent from its origin to the bifurcation. Mild atheromatous irregularity about the right bifurcation without hemodynamically significant stenosis. Right ICA patent distally to the circle-of-Willis without stenosis or occlusion. Left common carotid artery patent from its origin to the bifurcation without stenosis. No significant atheromatous narrowing about the left bifurcation. Left ICA tortuous but patent to the circle-of-Willis without stenosis or occlusion. Both of the vertebral arteries arise from the subclavian arteries. Vertebral arteries largely code dominant. Probable short-segment moderate stenosis at the origin of the left vertebral artery (series 1084, image 14). Vertebral arteries otherwise widely patent within the neck without stenosis or occlusion. IMPRESSION: MRI HEAD IMPRESSION: 1. Stable size of acute intraparenchymal hemorrhage centered at the left parietal convexity, estimated volume 25 cc. Surrounding vasogenic edema with mild regional mass effect without midline shift. No identifiable etiology identified on this exam. 2. Otherwise normal brain MRI for age. MRA HEAD IMPRESSION: 1. Technically limited exam due to moderate motion degradation. 2. Grossly negative intracranial MRA. No large vessel occlusion, hemodynamically significant stenosis, or other acute vascular process. No vascular abnormality seen underlying the left parietal hemorrhage. MRA NECK IMPRESSION: 1. Wide patency of the carotid artery systems bilaterally within the neck. 2. Short-segment moderate stenosis at the origin of the left vertebral artery. Vertebral arteries otherwise widely patent within the neck. Vertebral arteries are co dominant.  Electronically Signed   By: Jeannine Boga M.D.   On: 03/04/2019 01:21   Mr Brain Wo Contrast  Result Date: 03/04/2019 CLINICAL DATA:  Initial evaluation for acute aphasia, right-sided weakness, confusion. EXAM: MRI HEAD WITHOUT CONTRAST MRA HEAD WITHOUT CONTRAST MRA NECK WITHOUT AND WITH CONTRAST TECHNIQUE: Multiplanar, multiecho pulse sequences of the brain and surrounding structures were obtained without intravenous contrast. Angiographic images of the Circle of Willis were obtained using MRA technique without intravenous contrast. Angiographic images of the neck were obtained using MRA technique without and with intravenous contrast. Carotid stenosis measurements (when applicable) are obtained utilizing NASCET criteria, using the distal internal carotid diameter as the denominator. CONTRAST:  5.7 cc Gadavist. COMPARISON:  Prior CT from earlier the same day. FINDINGS: MRI HEAD FINDINGS Cerebral volume within normal limits for age. Minimal T2/FLAIR hyperintensity noted within the periventricular white matter, nonspecific, felt to be within normal limits for age. Previously identified intraparenchymal hemorrhage centered at the left parietal lobe again seen, measuring 2.9 x 4.0 x 4.3 cm (estimated volume 25 cc). Surrounding vasogenic edema with mild regional mass effect. No midline  shift. No intraventricular or extra-axial extension. No discernible underlying mass lesion or other abnormality. No significant dot burden to suggest cerebral amyloid angiopathy. No findings to suggest overlying trauma. Etiology unclear. No other abnormal foci of restricted diffusion to suggest acute or subacute ischemia. Gray-white matter differentiation otherwise maintained. No encephalomalacia to suggest chronic cortical infarction. No other acute or chronic intracranial hemorrhage. No mass lesion or midline shift. No hydrocephalus. No extra-axial fluid collection. Pituitary gland suprasellar region normal. Midline  structures intact. Major intracranial vascular flow voids maintained. Craniocervical junction normal. No focal marrow replacing lesion. Scalp soft tissues unremarkable. Patient status post bilateral ocular lens replacement. Paranasal sinuses are clear. Small left mastoid effusion noted, of doubtful significance. MRA HEAD FINDINGS ANTERIOR CIRCULATION: Examination moderately degraded by motion artifact. Distal cervical segments of the internal carotid arteries are patent with symmetric antegrade flow. Distal cervical left ICA tortuous. Petrous, cavernous, and supraclinoid segments patent without appreciable stenosis. A1 segments patent bilaterally. Anterior cerebral arteries patent to their distal aspects without definite stenosis. Anterior communicating artery not well assessed on this motion degraded exam. No M1 occlusion. Possible short-segment mild distal left M1 stenosis noted (series 1052, image 11). Normal MCA bifurcations. Distal MCA branches well perfused and grossly symmetric. No vascular abnormality seen underlying the partially visualized left parietal hematoma. POSTERIOR CIRCULATION: Vertebral arteries patent to the vertebrobasilar junction without stenosis. Vertebral arteries largely code dominant. Basilar widely patent to its distal aspect. Superior cerebral arteries patent bilaterally. Both of the posterior cerebral arteries primarily supplied via the basilar and are well perfused to their distal aspects. No definite flow-limiting stenosis. Prominent left posterior communicating artery noted. No visible aneurysm or other vascular abnormality on this motion degraded exam. MRA NECK FINDINGS Examination technically limited by motion artifact. Visualized aortic arch of normal caliber with normal 3 vessel morphology. No flow-limiting stenosis about the origin of the great vessels. Visualized subclavian arteries widely patent. Right common carotid artery widely patent from its origin to the bifurcation.  Mild atheromatous irregularity about the right bifurcation without hemodynamically significant stenosis. Right ICA patent distally to the circle-of-Willis without stenosis or occlusion. Left common carotid artery patent from its origin to the bifurcation without stenosis. No significant atheromatous narrowing about the left bifurcation. Left ICA tortuous but patent to the circle-of-Willis without stenosis or occlusion. Both of the vertebral arteries arise from the subclavian arteries. Vertebral arteries largely code dominant. Probable short-segment moderate stenosis at the origin of the left vertebral artery (series 1084, image 14). Vertebral arteries otherwise widely patent within the neck without stenosis or occlusion. IMPRESSION: MRI HEAD IMPRESSION: 1. Stable size of acute intraparenchymal hemorrhage centered at the left parietal convexity, estimated volume 25 cc. Surrounding vasogenic edema with mild regional mass effect without midline shift. No identifiable etiology identified on this exam. 2. Otherwise normal brain MRI for age. MRA HEAD IMPRESSION: 1. Technically limited exam due to moderate motion degradation. 2. Grossly negative intracranial MRA. No large vessel occlusion, hemodynamically significant stenosis, or other acute vascular process. No vascular abnormality seen underlying the left parietal hemorrhage. MRA NECK IMPRESSION: 1. Wide patency of the carotid artery systems bilaterally within the neck. 2. Short-segment moderate stenosis at the origin of the left vertebral artery. Vertebral arteries otherwise widely patent within the neck. Vertebral arteries are co dominant. Electronically Signed   By: Jeannine Boga M.D.   On: 03/04/2019 01:21   Mr Brain W Contrast  Result Date: 03/04/2019 CLINICAL DATA:  Intracranial hemorrhage.  History of lung cancer. EXAM: MRI HEAD WITH  CONTRAST TECHNIQUE: Multiplanar, multiecho pulse sequences of the brain and surrounding structures were obtained with  intravenous contrast. CONTRAST:  6 mL Gadavist COMPARISON:  Noncontrast MRI of the brain 03/03/2019 FINDINGS: Postcontrast T1-weighted imaging of the brain shows multiple intraparenchymal contrast-enhancing lesions. 1. Left cerebellum 5 x 5 mm, 2:40 2. Right temporal lobe 2 mm, 2:51 3. Peripheral contrast enhancement at the site of the known intraparenchymal hematoma in the posterior left parietal lobe, altogether measuring 3.7 x 2.9 cm, 2:87 4. Posterior left insula 5 x 5 mm, 2:91 5. Left frontal precentral gyrus 6 x 6 mm, 2:124 No contrast-enhancing lesions of the calvarium or extracranial soft tissues. IMPRESSION: 1. Five intraparenchymal metastatic lesions, the largest of which is located in the posterior left parietal lobe where the associated hemorrhage makes accurate measurement difficult. 2. Unchanged size of hematoma. Electronically Signed   By: Ulyses Jarred M.D.   On: 03/04/2019 17:57   Ct Head Code Stroke Wo Contrast  Result Date: 03/03/2019 CLINICAL DATA:  Code stroke. Acute onset of aphasia and right-sided weakness. EXAM: CT HEAD WITHOUT CONTRAST TECHNIQUE: Contiguous axial images were obtained from the base of the skull through the vertex without intravenous contrast. COMPARISON:  CT head without contrast 09/11/2016 FINDINGS: Brain: An acute left parietal and occipital hemorrhage measures 3.5 x 3.7 x 3.0 cm. There is surrounding edema with effacement of the adjacent sulci and minimal mass effect on the posterior horn of the left lateral ventricle. 3 mm midline shift is present. No other focal hemorrhage is present. No other mass lesion or acute infarct is evident. Basal ganglia are intact. Insular ribbon is normal. No significant extra-axial fluid collection is present. Vascular: Minimal calcifications are present. There is no hyperdense vessel. Skull: Calvarium is intact. No focal lytic or blastic lesions are present. Sinuses/Orbits: The paranasal sinuses and mastoid air cells are clear.  Bilateral lens replacements are noted. Globes and orbits are otherwise unremarkable. IMPRESSION: 1. Left parietal and occipital parenchymal hematoma measuring up to 3.7 cm. This is nonspecific. It is most likely related to hypertension or underlying vasculitis in a patient of this age. The differential diagnosis includes metastatic disease or other vascular malformation. No other lesions are evident. 2. Surrounding edema and mass effect is mostly local with minimal midline shift of 3-4 mm. 3. No other focal lesions or hemorrhage. Critical Value/emergent results were called by telephone at the time of interpretation on 03/03/2019 at 1:39 pm to Dr. Gerlene Fee , who verbally acknowledged these results. Electronically Signed   By: San Morelle M.D.   On: 03/03/2019 13:46   Transthoracic Echocardiogram  02/22/2019 normal ejection fraction 60 to 65%.  No wall motion abnormalities.    PHYSICAL EXAM  Temp:  [97.7 F (36.5 C)-98.4 F (36.9 C)] 98 F (36.7 C) (05/31 0337) Pulse Rate:  [68-93] 69 (05/31 0337) Resp:  [11-20] 15 (05/31 0337) BP: (98-119)/(62-77) 98/70 (05/31 0337) SpO2:  [92 %-97 %] 95 % (05/31 0337)  General - Well nourished, well developed, in no apparent distress.  Ophthalmologic - fundi not visualized due to noncooperation.  Cardiovascular - Regular rate and rhythm.  Mental Status -  Level of arousal and orientation to time, place, and person were intact. Language including expression, naming, repetition, comprehension was assessed and found intact.  Cranial Nerves II - XII - II - Visual field intact OU. III, IV, VI - Extraocular movements intact. V - Facial sensation intact bilaterally. VII - Facial movement intact bilaterally. VIII - Hearing & vestibular intact bilaterally.  X - Palate elevates symmetrically. XI - Chin turning & shoulder shrug intact bilaterally. XII - Tongue protrusion intact.  Motor Strength - The patient's strength was normal in all  extremities and pronator drift was absent.  Bulk was normal and fasciculations were absent.   Motor Tone - Muscle tone was assessed at the neck and appendages and was normal.  Reflexes - The patient's reflexes were symmetrical in all extremities and she had no pathological reflexes.  Sensory - Light touch, temperature/pinprick were assessed and were symmetrical.    Coordination - The patient had normal movements in the hands with no ataxia or dysmetria.  Tremor was absent  Gait and Station - deferred.    ASSESSMENT/PLAN Ms. ARROW EMMERICH is a 78 y.o. female with history of lung cancer, trigeminal neuralgia, on home O2, IBS, dysphagia, diastolic dysfunction, COPD, aortic aneurysm and anxiety presenting with left parietal/occipital ICH. Possible etiology is HTN, yet given her cancer history MRI with contrast shows multiple enhancing metastatic lesions with hemorrhage into one such lesion as a cause of her intracerebral hemorrhage. Neuro Oncology consult.  ICH - left parietal lobar hematoma - Etiology indeterminate, CAA vs underlying metastatic hemorrhage given prior history of lung cancer  CT head left parietal and occipital ICH  MRI head -stable IPH ~25cc volume; some surrounding vasogenic edema, no evidence of typical CAA  MRA head - neg for underlying vascular process  MRI scan of the brain with contrast : At least 5 enhancing metastatic lesions with the largest one in the left posterior parietal at the location of the hemorrhage  2D Echo - EF 60 to 65%  [employer] Virus 2 neg  LDL 155  HgbA1c 5.1  VTE prophylaxis - SCDs  No thinners or ASA prior to admit; currently not indicated  Ongoing aggressive stroke risk factor management  Therapy recommendations:  SNF  Disposition:  Pending  Brain metastasis  History of lung cancer  MRI w/gad - 5 small/punctate enhancement underlying metastatic lesions.   Neuro Onc consulted with plan for biopsy on Monday, 03/16/2019.   CT  chest/abdomen/pelvis showed right more than left airspace disease, no metastasis  Hypertension  Stable  BP goal < 160 . 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 early in 2014   Other Problems  3.4 cm abdominal aortic aneurysm is identified. Recommend followup by ultrasound in 3 years.   COPD on home oxygen  Trigeminal neuralgia   Hospital day # 5   Ruth Hawking, [name] Stroke Neurology 03/08/2019 1:13 PM     To contact Stroke Continuity provider, please refer to [email or web address] After hours, contact General Neurology

## 2019-03-09 ENCOUNTER — Inpatient Hospital Stay (HOSPITAL_COMMUNITY): Payer: PPO

## 2019-03-09 DIAGNOSIS — E785 Hyperlipidemia, unspecified: Secondary | ICD-10-CM

## 2019-03-09 LAB — CBC
HCT: 40.5 % (ref 36.0–46.0)
Hemoglobin: 13.4 g/dL (ref 12.0–15.0)
MCH: 29.7 pg (ref 26.0–34.0)
MCHC: 33.1 g/dL (ref 30.0–36.0)
MCV: 89.8 fL (ref 80.0–100.0)
Platelets: 334 10*3/uL (ref 150–400)
RBC: 4.51 MIL/uL (ref 3.87–5.11)
RDW: 12.2 % (ref 11.5–15.5)
WBC: 6.5 10*3/uL (ref 4.0–10.5)
nRBC: 0 % (ref 0.0–0.2)

## 2019-03-09 LAB — BASIC METABOLIC PANEL
Anion gap: 9 (ref 5–15)
BUN: 11 mg/dL (ref 8–23)
CO2: 29 mmol/L (ref 22–32)
Calcium: 9.8 mg/dL (ref 8.9–10.3)
Chloride: 96 mmol/L — ABNORMAL LOW (ref 98–111)
Creatinine, Ser: 0.57 mg/dL (ref 0.44–1.00)
GFR calc Af Amer: >60 mL/min (ref >60)
GFR calc non Af Amer: >60 mL/min (ref >60)
Glucose, Bld: 95 mg/dL (ref 70–99)
Potassium: 4.2 mmol/L (ref 3.5–5.1)
Sodium: 134 mmol/L — ABNORMAL LOW (ref 135–145)

## 2019-03-09 MED ORDER — ATORVASTATIN CALCIUM 40 MG PO TABS
40.0000 mg | ORAL_TABLET | Freq: Every day | ORAL | Status: DC
  Administered 2019-03-09 – 2019-03-11 (×3): 40 mg via ORAL
  Filled 2019-03-09 (×3): qty 1

## 2019-03-09 MED ORDER — OXYMETAZOLINE HCL 0.05 % NA SOLN
1.0000 | Freq: Two times a day (BID) | NASAL | Status: DC | PRN
  Administered 2019-03-09: 1 via NASAL
  Filled 2019-03-09: qty 30

## 2019-03-09 MED ORDER — GADOBUTROL 1 MMOL/ML IV SOLN
6.0000 mL | Freq: Once | INTRAVENOUS | Status: AC | PRN
  Administered 2019-03-09: 6 mL via INTRAVENOUS

## 2019-03-09 NOTE — Progress Notes (Signed)
Follow up visit with patient. Offering ministry of presence. Patient says that she will be undergoing radiation and surgery.  She was in markedly better spirits than last visit on Friday.  Chaplain explained Advanced Directive forms to her as she had heard from the nurse that the patient wished to appoint one twin son over another for health care decisions.  Twin sons are Quillian Quince and Shanon Brow.  Shanon Brow has the "big heart" but is a caregiver to his wife, so she is choosing Quillian Quince to make decisions.  However, she believes that the AD was completed years ago, and that completion of another form may be unnecessary.  Will follow up later. Rev. Tamsen Snider Pager [phone]

## 2019-03-09 NOTE — TOC Progression Note (Signed)
Transition of Care [facility]) - Progression Note    Patient Details  Name: [name] MRN: [id number] Date of Birth: 05-26-1941  Transition of Care [facility]) CM/SW [place], [place] Phone Number: 03/09/2019, 10:34 AM  Clinical Narrative:  Patient's family has chosen either [employer] [facility] or [facility] for SNF. CSW contacted [facility] and they do not have any beds available at this time. [facility] is able to accept the patient. CSW reached out to [facility] [employer] to initiate authorization request. CSW to follow.    Expected Discharge Plan: [place] Barriers to Discharge: Continued Medical Work up, Ship broker  Expected Discharge Plan and Services Expected Discharge Plan: [place] Choice: [place] arrangements for the past 2 months: Single Family Home                                       Social Determinants of Health (SDOH) Interventions    Readmission Risk Interventions No flowsheet data found.

## 2019-03-09 NOTE — Progress Notes (Signed)
Physical Therapy Treatment Patient Details Name: [name] MRN: [id number] DOB: Feb 15, 1941 Today's Date: 03/09/2019    History of Present Illness Pt is a 78 y/o female presents with aphasia and R sided weakness. 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 Will likely have surgery to address mets; neurosurgery following. PMH: anxiety, COPD on home oxgyen, depression, dsyphagia, trigeminal neuralgia, aortic aneurysm.      PT Comments    Pt progressing towards goals this session. Required min A for transfers without AD and min guard A for gait using RW. Pt with some SOB that resolved with seated rest, however, pulse ox not reading this session. Reviewed LE HEP with pt. Current recommendations appropriate. Will continue to follow acutely to maximize functional mobility independence and safety.    Follow Up Recommendations  SNF     Equipment Recommendations  None recommended by PT    Recommendations for Other Services       Precautions / Restrictions Precautions Precautions: Fall Restrictions Weight Bearing Restrictions: No    Mobility  Bed Mobility Overal bed mobility: Needs Assistance Bed Mobility: Supine to Sit;Sit to Supine     Supine to sit: Min guard Sit to supine: Min guard   General bed mobility comments: Min guard to supervision for safety.   Transfers Overall transfer level: Needs assistance Equipment used: None;Rolling walker (2 wheeled) Transfers: Sit to/from [facility] Sit to Stand: Min guard Stand pivot transfers: Min assist       General transfer comment: Min guard to stand for safety. Required assist to steady during transfer to [facility] without AD.   Ambulation/Gait Ambulation/Gait assistance: Min guard Gait Distance (Feet): 125 Feet Assistive device: Rolling walker (2 wheeled) Gait  Pattern/deviations: Step-through pattern;Decreased stride length Gait velocity: decreased   General Gait Details: Required cues for upright posture. Pt with SOB following ambulation that calmed with seated rest. Unable to get reading on pulse ox.    Stairs             Wheelchair Mobility    Modified Rankin (Stroke Patients Only) Modified Rankin (Stroke Patients Only) Pre-Morbid Rankin Score: Slight disability Modified Rankin: Moderately severe disability     Balance Overall balance assessment: Needs assistance Sitting-balance support: No upper extremity supported;Feet supported Sitting balance-Leahy Scale: Good     Standing balance support: Bilateral upper extremity supported;During functional activity Standing balance-Leahy Scale: Poor Standing balance comment: Reliant on BUE support                             Cognition Arousal/Alertness: Awake/alert Behavior During Therapy: WFL for tasks assessed/performed Overall Cognitive Status: Impaired/Different from baseline Area of Impairment: Problem solving;Following commands                       Following Commands: Follows multi-step commands inconsistently     Problem Solving: Requires verbal cues;Requires tactile cues General Comments: Improvement in ability to translate needs       Exercises General Exercises - Lower Extremity Ankle Circles/Pumps: AROM;Both;10 reps;Supine Long Arc Quad: AROM;Both;10 reps;Seated    General Comments        Pertinent Vitals/Pain Pain Assessment: No/denies pain    Home Living                      Prior Function  PT Goals (current goals can now be found in the care plan section) Acute Rehab PT Goals Patient Stated Goal: to go for a walk  PT Goal Formulation: With patient Time For Goal Achievement: 03/18/19 Potential to Achieve Goals: Good Progress towards PT goals: Progressing toward goals    Frequency    Min  4X/week      PT Plan Current plan remains appropriate    Co-evaluation              AM-PAC PT "6 Clicks" Mobility   Outcome Measure  Help needed turning from your back to your side while in a flat bed without using bedrails?: None Help needed moving from lying on your back to sitting on the side of a flat bed without using bedrails?: A Little Help needed moving to and from a bed to a chair (including a wheelchair)?: A Little Help needed standing up from a chair using your arms (e.g., wheelchair or bedside chair)?: A Little Help needed to walk in hospital room?: A Little Help needed climbing 3-5 steps with a railing? : A Lot 6 Click Score: 18    End of Session Equipment Utilized During Treatment: Gait belt;Oxygen Activity Tolerance: Patient tolerated treatment well Patient left: in bed;with call bell/phone within reach;with bed alarm set Nurse Communication: Mobility status PT Visit Diagnosis: Unsteadiness on feet (R26.81);Difficulty in walking, not elsewhere classified (R26.2)     Time: 3546-5681 PT Time Calculation (min) (ACUTE ONLY): 30 min  Charges:  $Gait Training: 8-22 mins $Therapeutic Activity: 8-22 mins                     Leighton Ruff, PT, DPT  Acute Rehabilitation Services  Pager: [phone] Office: [phone]    [name] 03/09/2019, 2:59 PM

## 2019-03-09 NOTE — Progress Notes (Signed)
STROKE TEAM PROGRESS NOTE     SUBJECTIVE (INTERVAL HISTORY) Patient sitting in bed for lunch.  No complaints.  Discussed with Dr. Kathyrn Sheriff, will have MRI done today per [facility] protocol.  OBJECTIVE Vitals:   03/08/19 2314 03/09/19 0335 03/09/19 0803 03/09/19 1134  BP: 94/63 104/63 102/71 120/78  Pulse: 76 68 72 80  Resp: 12 12 15 15   Temp: 98 F (36.7 C) 97.9 F (36.6 C) 97.7 F (36.5 C) 98.1 F (36.7 C)  TempSrc: Oral Oral Oral Oral  SpO2: 95% 93% 94% 95%  Weight:      Height:        CBC:  Recent Labs  Lab 03/03/19 1352 03/08/19 0426 03/09/19 0739  WBC 9.8 6.3 6.5  NEUTROABS 8.8*  --   --   HGB 13.0 12.9 13.4  HCT 39.5 39.3 40.5  MCV 92.1 89.9 89.8  PLT 298 332 993    Basic Metabolic Panel:  Recent Labs  Lab 03/08/19 0426 03/09/19 0739  NA 134* 134*  K 3.9 4.2  CL 97* 96*  CO2 28 29  GLUCOSE 90 95  BUN 9 11  CREATININE 0.54 0.57  CALCIUM 9.7 9.8    Lipid Panel:     Component Value Date/Time   CHOL 220 (H) 03/08/2019 0426   TRIG 57 03/08/2019 0426   HDL 54 03/08/2019 0426   CHOLHDL 4.1 03/08/2019 0426   VLDL 11 03/08/2019 0426   LDLCALC 155 (H) 03/08/2019 0426   HgbA1c:  Lab Results  Component Value Date   HGBA1C 5.1 03/08/2019   Urine Drug Screen:     Component Value Date/Time   LABOPIA NONE DETECTED 03/06/2019 0332   COCAINSCRNUR NONE DETECTED 03/06/2019 0332   LABBENZ POSITIVE (A) 03/06/2019 0332   AMPHETMU NONE DETECTED 03/06/2019 0332   THCU NONE DETECTED 03/06/2019 0332   LABBARB NONE DETECTED 03/06/2019 0332    Alcohol Level     Component Value Date/Time   ETH <10 03/03/2019 1352    IMAGING  Ct Abdomen Pelvis Wo Contrast  Result Date: 03/06/2019 CLINICAL DATA:  Acute onset generalized pain today. EXAM: CT CHEST, ABDOMEN AND PELVIS WITHOUT CONTRAST TECHNIQUE: Multidetector CT imaging of the chest, abdomen and pelvis was performed following the standard protocol without IV contrast. COMPARISON:  CT chest 05/06/2018 and  05/09/2017. FINDINGS: CT CHEST FINDINGS Cardiovascular: Heart size is normal. No pericardial effusion. Calcific aortic atherosclerosis noted. Mediastinum/Nodes: No enlarged mediastinal, hilar, or axillary lymph nodes. Thyroid gland, trachea, and esophagus demonstrate no significant findings. Lungs/Pleura: Extensive centrilobular emphysematous disease is identified. A 0.4 cm nodule in the superior segment of the right lower lobe on image 65 is unchanged. No new or enlarging pulmonary nodule is identified. There is some dependent airspace disease which is greater on the right. Suture material in the left lower lobe noted. Musculoskeletal: Severe T6 compression fracture and superior endplate compression fracture T8 are unchanged. Pedicle screws and stabilization bars from T10-L2 noted. Severe T12 and L1 compression fractures are unchanged. No new fracture identified. No lytic or sclerotic lesion. CT ABDOMEN PELVIS FINDINGS Hepatobiliary: No focal liver abnormality is seen. Status post cholecystectomy. No biliary dilatation. Pancreas: Unremarkable. No pancreatic ductal dilatation or surrounding inflammatory changes. Spleen: Normal in size without focal abnormality. Adrenals/Urinary Tract: Punctate parenchymal calcification the right kidney is unchanged. The kidneys otherwise appear. Ureters and urinary are unremarkable. Adrenal glands are. Stomach/Bowel: Stomach is within normal limits. Appendix appears normal. No evidence of bowel wall thickening, distention, or inflammatory changes. Scattered diverticulosis noted. Vascular/Lymphatic:  Extensive atherosclerotic vascular disease is present. The descending abdominal aorta measures up to 3.4 cm in diameter and the right common iliac artery measures 1.4 cm. Reproductive: Status post hysterectomy. No adnexal masses. Other: None. Musculoskeletal: There is a mild compression fracture deformity of L3 which appears remote. There is convex right lumbar scoliosis and multilevel  degenerative change. Advanced right hip osteoarthritis is noted. IMPRESSION: Dependent bibasilar airspace disease is greater on the right and likely represents atelectasis but could be secondary to pneumonia. No other finding to suggest acute abnormality is seen in the chest, abdomen or pelvis. Emphysema. Atherosclerosis. 3.4 cm abdominal aortic aneurysm is identified. Recommend followup by ultrasound in 3 years. This recommendation follows ACR consensus guidelines: White Paper of the ACR Incidental Findings Committee II on Vascular Findings. J Am Coll Radiol 2013; 10:789-794. Diverticulosis diverticulitis. Remote thoracic and lumbar spine compression fractures. Electronically Signed   By: Inge Rise M.D.   On: 03/06/2019 17:40   Ct Head Wo Contrast  Result Date: 03/04/2019 CLINICAL DATA:  Intracranial hemorrhage follow-up EXAM: CT HEAD WITHOUT CONTRAST TECHNIQUE: Contiguous axial images were obtained from the base of the skull through the vertex without intravenous contrast. COMPARISON:  Brain MRI from yesterday FINDINGS: Brain: Up to 4.2 Cm (craniocaudal) parenchymal hematoma in the left parietal lobe that is unchanged in extent when compared to brain MRI yesterday. There is a rim of edema that is also stable. Local swelling without midline shift. No hydrocephalus or extra-axial collection. No visible infarct Vascular: Atherosclerotic calcification Skull: No acute or aggressive finding Sinuses/Orbits: Bilateral cataract resection. IMPRESSION: Size stable left parietal hematoma with edema. No new abnormality or midline shift. Electronically Signed   By: Monte Fantasia M.D.   On: 03/04/2019 05:25   Ct Chest Wo Contrast  Result Date: 03/06/2019 CLINICAL DATA:  Acute onset generalized pain today. EXAM: CT CHEST, ABDOMEN AND PELVIS WITHOUT CONTRAST TECHNIQUE: Multidetector CT imaging of the chest, abdomen and pelvis was performed following the standard protocol without IV contrast. COMPARISON:  CT chest  05/06/2018 and 05/09/2017. FINDINGS: CT CHEST FINDINGS Cardiovascular: Heart size is normal. No pericardial effusion. Calcific aortic atherosclerosis noted. Mediastinum/Nodes: No enlarged mediastinal, hilar, or axillary lymph nodes. Thyroid gland, trachea, and esophagus demonstrate no significant findings. Lungs/Pleura: Extensive centrilobular emphysematous disease is identified. A 0.4 cm nodule in the superior segment of the right lower lobe on image 65 is unchanged. No new or enlarging pulmonary nodule is identified. There is some dependent airspace disease which is greater on the right. Suture material in the left lower lobe noted. Musculoskeletal: Severe T6 compression fracture and superior endplate compression fracture T8 are unchanged. Pedicle screws and stabilization bars from T10-L2 noted. Severe T12 and L1 compression fractures are unchanged. No new fracture identified. No lytic or sclerotic lesion. CT ABDOMEN PELVIS FINDINGS Hepatobiliary: No focal liver abnormality is seen. Status post cholecystectomy. No biliary dilatation. Pancreas: Unremarkable. No pancreatic ductal dilatation or surrounding inflammatory changes. Spleen: Normal in size without focal abnormality. Adrenals/Urinary Tract: Punctate parenchymal calcification the right kidney is unchanged. The kidneys otherwise appear. Ureters and urinary are unremarkable. Adrenal glands are. Stomach/Bowel: Stomach is within normal limits. Appendix appears normal. No evidence of bowel wall thickening, distention, or inflammatory changes. Scattered diverticulosis noted. Vascular/Lymphatic: Extensive atherosclerotic vascular disease is present. The descending abdominal aorta measures up to 3.4 cm in diameter and the right common iliac artery measures 1.4 cm. Reproductive: Status post hysterectomy. No adnexal masses. Other: None. Musculoskeletal: There is a mild compression fracture deformity of L3 which  appears remote. There is convex right lumbar scoliosis  and multilevel degenerative change. Advanced right hip osteoarthritis is noted. IMPRESSION: Dependent bibasilar airspace disease is greater on the right and likely represents atelectasis but could be secondary to pneumonia. No other finding to suggest acute abnormality is seen in the chest, abdomen or pelvis. Emphysema. Atherosclerosis. 3.4 cm abdominal aortic aneurysm is identified. Recommend followup by ultrasound in 3 years. This recommendation follows ACR consensus guidelines: White Paper of the ACR Incidental Findings Committee II on Vascular Findings. J Am Coll Radiol 2013; 10:789-794. Diverticulosis diverticulitis. Remote thoracic and lumbar spine compression fractures. Electronically Signed   By: Inge Rise M.D.   On: 03/06/2019 17:40   Mr Virgel Paling PX Contrast  Result Date: 03/04/2019 CLINICAL DATA:  Initial evaluation for acute aphasia, right-sided weakness, confusion. EXAM: MRI HEAD WITHOUT CONTRAST MRA HEAD WITHOUT CONTRAST MRA NECK WITHOUT AND WITH CONTRAST TECHNIQUE: Multiplanar, multiecho pulse sequences of the brain and surrounding structures were obtained without intravenous contrast. Angiographic images of the Circle of Willis were obtained using MRA technique without intravenous contrast. Angiographic images of the neck were obtained using MRA technique without and with intravenous contrast. Carotid stenosis measurements (when applicable) are obtained utilizing NASCET criteria, using the distal internal carotid diameter as the denominator. CONTRAST:  5.7 cc Gadavist. COMPARISON:  Prior CT from earlier the same day. FINDINGS: MRI HEAD FINDINGS Cerebral volume within normal limits for age. Minimal T2/FLAIR hyperintensity noted within the periventricular white matter, nonspecific, felt to be within normal limits for age. Previously identified intraparenchymal hemorrhage centered at the left parietal lobe again seen, measuring 2.9 x 4.0 x 4.3 cm (estimated volume 25 cc). Surrounding vasogenic  edema with mild regional mass effect. No midline shift. No intraventricular or extra-axial extension. No discernible underlying mass lesion or other abnormality. No significant dot burden to suggest cerebral amyloid angiopathy. No findings to suggest overlying trauma. Etiology unclear. No other abnormal foci of restricted diffusion to suggest acute or subacute ischemia. Gray-white matter differentiation otherwise maintained. No encephalomalacia to suggest chronic cortical infarction. No other acute or chronic intracranial hemorrhage. No mass lesion or midline shift. No hydrocephalus. No extra-axial fluid collection. Pituitary gland suprasellar region normal. Midline structures intact. Major intracranial vascular flow voids maintained. Craniocervical junction normal. No focal marrow replacing lesion. Scalp soft tissues unremarkable. Patient status post bilateral ocular lens replacement. Paranasal sinuses are clear. Small left mastoid effusion noted, of doubtful significance. MRA HEAD FINDINGS ANTERIOR CIRCULATION: Examination moderately degraded by motion artifact. Distal cervical segments of the internal carotid arteries are patent with symmetric antegrade flow. Distal cervical left ICA tortuous. Petrous, cavernous, and supraclinoid segments patent without appreciable stenosis. A1 segments patent bilaterally. Anterior cerebral arteries patent to their distal aspects without definite stenosis. Anterior communicating artery not well assessed on this motion degraded exam. No M1 occlusion. Possible short-segment mild distal left M1 stenosis noted (series 1052, image 11). Normal MCA bifurcations. Distal MCA branches well perfused and grossly symmetric. No vascular abnormality seen underlying the partially visualized left parietal hematoma. POSTERIOR CIRCULATION: Vertebral arteries patent to the vertebrobasilar junction without stenosis. Vertebral arteries largely code dominant. Basilar widely patent to its distal aspect.  Superior cerebral arteries patent bilaterally. Both of the posterior cerebral arteries primarily supplied via the basilar and are well perfused to their distal aspects. No definite flow-limiting stenosis. Prominent left posterior communicating artery noted. No visible aneurysm or other vascular abnormality on this motion degraded exam. MRA NECK FINDINGS Examination technically limited by motion artifact. Visualized aortic arch  of normal caliber with normal 3 vessel morphology. No flow-limiting stenosis about the origin of the great vessels. Visualized subclavian arteries widely patent. Right common carotid artery widely patent from its origin to the bifurcation. Mild atheromatous irregularity about the right bifurcation without hemodynamically significant stenosis. Right ICA patent distally to the circle-of-Willis without stenosis or occlusion. Left common carotid artery patent from its origin to the bifurcation without stenosis. No significant atheromatous narrowing about the left bifurcation. Left ICA tortuous but patent to the circle-of-Willis without stenosis or occlusion. Both of the vertebral arteries arise from the subclavian arteries. Vertebral arteries largely code dominant. Probable short-segment moderate stenosis at the origin of the left vertebral artery (series 1084, image 14). Vertebral arteries otherwise widely patent within the neck without stenosis or occlusion. IMPRESSION: MRI HEAD IMPRESSION: 1. Stable size of acute intraparenchymal hemorrhage centered at the left parietal convexity, estimated volume 25 cc. Surrounding vasogenic edema with mild regional mass effect without midline shift. No identifiable etiology identified on this exam. 2. Otherwise normal brain MRI for age. MRA HEAD IMPRESSION: 1. Technically limited exam due to moderate motion degradation. 2. Grossly negative intracranial MRA. No large vessel occlusion, hemodynamically significant stenosis, or other acute vascular process. No  vascular abnormality seen underlying the left parietal hemorrhage. MRA NECK IMPRESSION: 1. Wide patency of the carotid artery systems bilaterally within the neck. 2. Short-segment moderate stenosis at the origin of the left vertebral artery. Vertebral arteries otherwise widely patent within the neck. Vertebral arteries are co dominant. Electronically Signed   By: Jeannine Boga M.D.   On: 03/04/2019 01:21   Mr Jodene Nam Neck W Wo Contrast  Result Date: 03/04/2019 CLINICAL DATA:  Initial evaluation for acute aphasia, right-sided weakness, confusion. EXAM: MRI HEAD WITHOUT CONTRAST MRA HEAD WITHOUT CONTRAST MRA NECK WITHOUT AND WITH CONTRAST TECHNIQUE: Multiplanar, multiecho pulse sequences of the brain and surrounding structures were obtained without intravenous contrast. Angiographic images of the Circle of Willis were obtained using MRA technique without intravenous contrast. Angiographic images of the neck were obtained using MRA technique without and with intravenous contrast. Carotid stenosis measurements (when applicable) are obtained utilizing NASCET criteria, using the distal internal carotid diameter as the denominator. CONTRAST:  5.7 cc Gadavist. COMPARISON:  Prior CT from earlier the same day. FINDINGS: MRI HEAD FINDINGS Cerebral volume within normal limits for age. Minimal T2/FLAIR hyperintensity noted within the periventricular white matter, nonspecific, felt to be within normal limits for age. Previously identified intraparenchymal hemorrhage centered at the left parietal lobe again seen, measuring 2.9 x 4.0 x 4.3 cm (estimated volume 25 cc). Surrounding vasogenic edema with mild regional mass effect. No midline shift. No intraventricular or extra-axial extension. No discernible underlying mass lesion or other abnormality. No significant dot burden to suggest cerebral amyloid angiopathy. No findings to suggest overlying trauma. Etiology unclear. No other abnormal foci of restricted diffusion to  suggest acute or subacute ischemia. Gray-white matter differentiation otherwise maintained. No encephalomalacia to suggest chronic cortical infarction. No other acute or chronic intracranial hemorrhage. No mass lesion or midline shift. No hydrocephalus. No extra-axial fluid collection. Pituitary gland suprasellar region normal. Midline structures intact. Major intracranial vascular flow voids maintained. Craniocervical junction normal. No focal marrow replacing lesion. Scalp soft tissues unremarkable. Patient status post bilateral ocular lens replacement. Paranasal sinuses are clear. Small left mastoid effusion noted, of doubtful significance. MRA HEAD FINDINGS ANTERIOR CIRCULATION: Examination moderately degraded by motion artifact. Distal cervical segments of the internal carotid arteries are patent with symmetric antegrade flow. Distal cervical left  ICA tortuous. Petrous, cavernous, and supraclinoid segments patent without appreciable stenosis. A1 segments patent bilaterally. Anterior cerebral arteries patent to their distal aspects without definite stenosis. Anterior communicating artery not well assessed on this motion degraded exam. No M1 occlusion. Possible short-segment mild distal left M1 stenosis noted (series 1052, image 11). Normal MCA bifurcations. Distal MCA branches well perfused and grossly symmetric. No vascular abnormality seen underlying the partially visualized left parietal hematoma. POSTERIOR CIRCULATION: Vertebral arteries patent to the vertebrobasilar junction without stenosis. Vertebral arteries largely code dominant. Basilar widely patent to its distal aspect. Superior cerebral arteries patent bilaterally. Both of the posterior cerebral arteries primarily supplied via the basilar and are well perfused to their distal aspects. No definite flow-limiting stenosis. Prominent left posterior communicating artery noted. No visible aneurysm or other vascular abnormality on this motion degraded  exam. MRA NECK FINDINGS Examination technically limited by motion artifact. Visualized aortic arch of normal caliber with normal 3 vessel morphology. No flow-limiting stenosis about the origin of the great vessels. Visualized subclavian arteries widely patent. Right common carotid artery widely patent from its origin to the bifurcation. Mild atheromatous irregularity about the right bifurcation without hemodynamically significant stenosis. Right ICA patent distally to the circle-of-Willis without stenosis or occlusion. Left common carotid artery patent from its origin to the bifurcation without stenosis. No significant atheromatous narrowing about the left bifurcation. Left ICA tortuous but patent to the circle-of-Willis without stenosis or occlusion. Both of the vertebral arteries arise from the subclavian arteries. Vertebral arteries largely code dominant. Probable short-segment moderate stenosis at the origin of the left vertebral artery (series 1084, image 14). Vertebral arteries otherwise widely patent within the neck without stenosis or occlusion. IMPRESSION: MRI HEAD IMPRESSION: 1. Stable size of acute intraparenchymal hemorrhage centered at the left parietal convexity, estimated volume 25 cc. Surrounding vasogenic edema with mild regional mass effect without midline shift. No identifiable etiology identified on this exam. 2. Otherwise normal brain MRI for age. MRA HEAD IMPRESSION: 1. Technically limited exam due to moderate motion degradation. 2. Grossly negative intracranial MRA. No large vessel occlusion, hemodynamically significant stenosis, or other acute vascular process. No vascular abnormality seen underlying the left parietal hemorrhage. MRA NECK IMPRESSION: 1. Wide patency of the carotid artery systems bilaterally within the neck. 2. Short-segment moderate stenosis at the origin of the left vertebral artery. Vertebral arteries otherwise widely patent within the neck. Vertebral arteries are co  dominant. Electronically Signed   By: Jeannine Boga M.D.   On: 03/04/2019 01:21   Mr Brain Wo Contrast  Result Date: 03/04/2019 CLINICAL DATA:  Initial evaluation for acute aphasia, right-sided weakness, confusion. EXAM: MRI HEAD WITHOUT CONTRAST MRA HEAD WITHOUT CONTRAST MRA NECK WITHOUT AND WITH CONTRAST TECHNIQUE: Multiplanar, multiecho pulse sequences of the brain and surrounding structures were obtained without intravenous contrast. Angiographic images of the Circle of Willis were obtained using MRA technique without intravenous contrast. Angiographic images of the neck were obtained using MRA technique without and with intravenous contrast. Carotid stenosis measurements (when applicable) are obtained utilizing NASCET criteria, using the distal internal carotid diameter as the denominator. CONTRAST:  5.7 cc Gadavist. COMPARISON:  Prior CT from earlier the same day. FINDINGS: MRI HEAD FINDINGS Cerebral volume within normal limits for age. Minimal T2/FLAIR hyperintensity noted within the periventricular white matter, nonspecific, felt to be within normal limits for age. Previously identified intraparenchymal hemorrhage centered at the left parietal lobe again seen, measuring 2.9 x 4.0 x 4.3 cm (estimated volume 25 cc). Surrounding vasogenic edema with mild  regional mass effect. No midline shift. No intraventricular or extra-axial extension. No discernible underlying mass lesion or other abnormality. No significant dot burden to suggest cerebral amyloid angiopathy. No findings to suggest overlying trauma. Etiology unclear. No other abnormal foci of restricted diffusion to suggest acute or subacute ischemia. Gray-white matter differentiation otherwise maintained. No encephalomalacia to suggest chronic cortical infarction. No other acute or chronic intracranial hemorrhage. No mass lesion or midline shift. No hydrocephalus. No extra-axial fluid collection. Pituitary gland suprasellar region normal. Midline  structures intact. Major intracranial vascular flow voids maintained. Craniocervical junction normal. No focal marrow replacing lesion. Scalp soft tissues unremarkable. Patient status post bilateral ocular lens replacement. Paranasal sinuses are clear. Small left mastoid effusion noted, of doubtful significance. MRA HEAD FINDINGS ANTERIOR CIRCULATION: Examination moderately degraded by motion artifact. Distal cervical segments of the internal carotid arteries are patent with symmetric antegrade flow. Distal cervical left ICA tortuous. Petrous, cavernous, and supraclinoid segments patent without appreciable stenosis. A1 segments patent bilaterally. Anterior cerebral arteries patent to their distal aspects without definite stenosis. Anterior communicating artery not well assessed on this motion degraded exam. No M1 occlusion. Possible short-segment mild distal left M1 stenosis noted (series 1052, image 11). Normal MCA bifurcations. Distal MCA branches well perfused and grossly symmetric. No vascular abnormality seen underlying the partially visualized left parietal hematoma. POSTERIOR CIRCULATION: Vertebral arteries patent to the vertebrobasilar junction without stenosis. Vertebral arteries largely code dominant. Basilar widely patent to its distal aspect. Superior cerebral arteries patent bilaterally. Both of the posterior cerebral arteries primarily supplied via the basilar and are well perfused to their distal aspects. No definite flow-limiting stenosis. Prominent left posterior communicating artery noted. No visible aneurysm or other vascular abnormality on this motion degraded exam. MRA NECK FINDINGS Examination technically limited by motion artifact. Visualized aortic arch of normal caliber with normal 3 vessel morphology. No flow-limiting stenosis about the origin of the great vessels. Visualized subclavian arteries widely patent. Right common carotid artery widely patent from its origin to the bifurcation.  Mild atheromatous irregularity about the right bifurcation without hemodynamically significant stenosis. Right ICA patent distally to the circle-of-Willis without stenosis or occlusion. Left common carotid artery patent from its origin to the bifurcation without stenosis. No significant atheromatous narrowing about the left bifurcation. Left ICA tortuous but patent to the circle-of-Willis without stenosis or occlusion. Both of the vertebral arteries arise from the subclavian arteries. Vertebral arteries largely code dominant. Probable short-segment moderate stenosis at the origin of the left vertebral artery (series 1084, image 14). Vertebral arteries otherwise widely patent within the neck without stenosis or occlusion. IMPRESSION: MRI HEAD IMPRESSION: 1. Stable size of acute intraparenchymal hemorrhage centered at the left parietal convexity, estimated volume 25 cc. Surrounding vasogenic edema with mild regional mass effect without midline shift. No identifiable etiology identified on this exam. 2. Otherwise normal brain MRI for age. MRA HEAD IMPRESSION: 1. Technically limited exam due to moderate motion degradation. 2. Grossly negative intracranial MRA. No large vessel occlusion, hemodynamically significant stenosis, or other acute vascular process. No vascular abnormality seen underlying the left parietal hemorrhage. MRA NECK IMPRESSION: 1. Wide patency of the carotid artery systems bilaterally within the neck. 2. Short-segment moderate stenosis at the origin of the left vertebral artery. Vertebral arteries otherwise widely patent within the neck. Vertebral arteries are co dominant. Electronically Signed   By: Jeannine Boga M.D.   On: 03/04/2019 01:21   Mr Brain W Contrast  Result Date: 03/04/2019 CLINICAL DATA:  Intracranial hemorrhage.  History of lung  cancer. EXAM: MRI HEAD WITH CONTRAST TECHNIQUE: Multiplanar, multiecho pulse sequences of the brain and surrounding structures were obtained with  intravenous contrast. CONTRAST:  6 mL Gadavist COMPARISON:  Noncontrast MRI of the brain 03/03/2019 FINDINGS: Postcontrast T1-weighted imaging of the brain shows multiple intraparenchymal contrast-enhancing lesions. 1. Left cerebellum 5 x 5 mm, 2:40 2. Right temporal lobe 2 mm, 2:51 3. Peripheral contrast enhancement at the site of the known intraparenchymal hematoma in the posterior left parietal lobe, altogether measuring 3.7 x 2.9 cm, 2:87 4. Posterior left insula 5 x 5 mm, 2:91 5. Left frontal precentral gyrus 6 x 6 mm, 2:124 No contrast-enhancing lesions of the calvarium or extracranial soft tissues. IMPRESSION: 1. Five intraparenchymal metastatic lesions, the largest of which is located in the posterior left parietal lobe where the associated hemorrhage makes accurate measurement difficult. 2. Unchanged size of hematoma. Electronically Signed   By: Ulyses Jarred M.D.   On: 03/04/2019 17:57   Ct Head Code Stroke Wo Contrast  Result Date: 03/03/2019 CLINICAL DATA:  Code stroke. Acute onset of aphasia and right-sided weakness. EXAM: CT HEAD WITHOUT CONTRAST TECHNIQUE: Contiguous axial images were obtained from the base of the skull through the vertex without intravenous contrast. COMPARISON:  CT head without contrast 09/11/2016 FINDINGS: Brain: An acute left parietal and occipital hemorrhage measures 3.5 x 3.7 x 3.0 cm. There is surrounding edema with effacement of the adjacent sulci and minimal mass effect on the posterior horn of the left lateral ventricle. 3 mm midline shift is present. No other focal hemorrhage is present. No other mass lesion or acute infarct is evident. Basal ganglia are intact. Insular ribbon is normal. No significant extra-axial fluid collection is present. Vascular: Minimal calcifications are present. There is no hyperdense vessel. Skull: Calvarium is intact. No focal lytic or blastic lesions are present. Sinuses/Orbits: The paranasal sinuses and mastoid air cells are clear.  Bilateral lens replacements are noted. Globes and orbits are otherwise unremarkable. IMPRESSION: 1. Left parietal and occipital parenchymal hematoma measuring up to 3.7 cm. This is nonspecific. It is most likely related to hypertension or underlying vasculitis in a patient of this age. The differential diagnosis includes metastatic disease or other vascular malformation. No other lesions are evident. 2. Surrounding edema and mass effect is mostly local with minimal midline shift of 3-4 mm. 3. No other focal lesions or hemorrhage. Critical Value/emergent results were called by telephone at the time of interpretation on 03/03/2019 at 1:39 pm to Dr. Gerlene Fee , who verbally acknowledged these results. Electronically Signed   By: San Morelle M.D.   On: 03/03/2019 13:46   Transthoracic Echocardiogram  02/22/2019 normal ejection fraction 60 to 65%.  No wall motion abnormalities.    PHYSICAL EXAM   Temp:  [97.7 F (36.5 C)-98.1 F (36.7 C)] 98.1 F (36.7 C) (06/01 1134) Pulse Rate:  [68-88] 80 (06/01 1134) Resp:  [12-18] 15 (06/01 1134) BP: (94-120)/(63-78) 120/78 (06/01 1134) SpO2:  [93 %-95 %] 95 % (06/01 1134)  General - Well nourished, well developed, in no apparent distress.  Ophthalmologic - fundi not visualized due to noncooperation.  Cardiovascular - Regular rate and rhythm.  Mental Status -  Level of arousal and orientation to time, place, and person were intact. Language including expression, naming, repetition, comprehension was assessed and found intact.  Cranial Nerves II - XII - II - Visual field intact OU. III, IV, VI - Extraocular movements intact. V - Facial sensation intact bilaterally. VII - Facial movement intact bilaterally. VIII -  Hearing & vestibular intact bilaterally. X - Palate elevates symmetrically. XI - Chin turning & shoulder shrug intact bilaterally. XII - Tongue protrusion intact.  Motor Strength - The patient's strength was normal in all  extremities and pronator drift was absent.  Bulk was normal and fasciculations were absent.   Motor Tone - Muscle tone was assessed at the neck and appendages and was normal.  Reflexes - The patient's reflexes were symmetrical in all extremities and she had no pathological reflexes.  Sensory - Light touch, temperature/pinprick were assessed and were symmetrical.    Coordination - The patient had normal movements in the hands with no ataxia or dysmetria.  Tremor was absent  Gait and Station - deferred.    ASSESSMENT/PLAN Ms. ALVAH GILDER is a 78 y.o. female with history of lung cancer, trigeminal neuralgia, on home O2, IBS, dysphagia, diastolic dysfunction, COPD, aortic aneurysm and anxiety presenting with left parietal/occipital ICH. Possible etiology is HTN, yet given her cancer history MRI with contrast shows multiple enhancing metastatic lesions with hemorrhage into one such lesion as a cause of her intracerebral hemorrhage. Neuro Oncology consult.  ICH - left parietal lobar hematoma - Etiology indeterminate, CAA vs underlying metastatic hemorrhage given prior history of lung cancer  CT head left parietal and occipital ICH  MRI head -stable IPH ~25cc volume; some surrounding vasogenic edema, no evidence of typical CAA  MRA head - neg for underlying vascular process  MRI brain with contrast : At least 5 enhancing metastatic lesions with the largest one in the left posterior parietal at the location of the hemorrhage  Repeat MRI brain with and without contrast per [facility] protocol pending    2D Echo - EF 60 to 65%  [employer] Virus 2 neg  LDL 155  HgbA1c 5.1  VTE prophylaxis - SCDs  No thinners or ASA prior to admit; currently not indicated  Therapy recommendations:  SNF  Disposition:  Pending  Hope for d/c to SNF tomorrow  Brain metastasis  History of lung cancer  MRI w/gad - 5 small/punctate enhancement underlying metastatic lesions.   Repeat MRI brain with and  without contrast per [facility] protocol pending  Neuro Onc consulted with plan for biopsy on Monday, 03/16/2019.   Neurosurgery Kathyrn Sheriff) consulted and plans for stereotactic radiosurgery Tues 6/9 followed by surgical resection of the large L parietal-occipital lesion on 6/10   CT chest/abdomen/pelvis showed right more than left airspace disease, no metastasis  Neurosurgeon ok for d/c and return next week  Hypertension  Stable  BP goal < 160 . 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 Recommend followup by ultrasound in 3 years.   COPD on home oxygen  Trigeminal neuralgia  Hospital day # 6    Rosalin Hawking, MD PhD Stroke Neurology 03/09/2019 2:15 PM     To contact Stroke Continuity provider, please refer to [email or web address] After hours, contact General Neurology

## 2019-03-09 NOTE — Progress Notes (Signed)
Spoke with Phil @ [facility] and arranged transportation to bring patient from [facility] to [facility] radiation oncology for simulation by 0830. Then, phoned Lauren, RN on 3W at [facility] for patient. Requested Lauren call report to [facility] and advised of transport for tomorrow morning. Lauren, RN confirms the patient has a patent IV in her left forearm.

## 2019-03-09 NOTE — Progress Notes (Signed)
  NEUROSURGERY PROGRESS NOTE   No issues overnight. Pt reports occasional stuttering and word-finding difficulty but improved since last week. Otherwise no complaints.  EXAM:  BP 120/78 (BP Location: Left Arm)   Pulse 80   Temp 98.1 F (36.7 C) (Oral)   Resp 15   Ht 5\' 5"  (1.651 m)   Wt 57 kg   SpO2 95%   BMI 20.91 kg/m   Awake, alert, oriented  Speech fluent, appropriate  CN grossly intact  5/5 BUE/BLE   IMAGING: MRI of the brain with contrast was reviewed.  This demonstrates multiple intracranial metastases including the left cerebellum, right temporal lobe, left insular lobe, and left frontal lobe.  All of these are subcentimeter lesions.  The largest lesion is seen in the left parieto-occipital region measuring nearly 4 cm in maximal diameter.  There is associated peripheral contrast-enhancement, and hemorrhage within this lesion.  There is mild amount of surrounding edema.  IMPRESSION:  78 y.o. female with remote hx of non-small cell lung CA, presenting with headache and aphasia related to hemorrhagic transformation of a large left parieto-occipital metastasis.  She does also have multiple other smaller subcentimeter lesions.  I have reviewed this patient's case with our neuro-oncologist.  We both agree that the smaller lesions can be treated with stereotactic radiosurgery.  Because of the size of the larger lesion, preoperative stereotactic radiosurgery followed by surgical resection would be the most viable option.  PLAN: -We will plan on stereotactic radiosurgery preoperatively next Tuesday, June 9 followed by surgical resection of the larger left parietal-occipital lesion the following day, June 10. -From my standpoint, the patient could be discharged from the hospital, and brought in next week for [facility] and surgery.  I did review the MRI findings with the patient.  I reviewed with her our recommendation for preoperative stereotactic radiosurgery followed by surgical  resection.  Risks of the surgery were discussed including the risk of worsening aphasia, numbness, weakness/paralysis, stroke, hemorrhage, and seizures.  General risks of craniotomy including bleeding, infection and CSF leak were also discussed.  Patient appeared to understand our discussion.  All her questions today were answered.  She indicated willingness to proceed as above.

## 2019-03-10 ENCOUNTER — Ambulatory Visit
Admit: 2019-03-10 | Discharge: 2019-03-10 | Disposition: A | Discharge status: Home / Self Care | Payer: PPO | Attending: Radiation Oncology | Admitting: Radiation Oncology

## 2019-03-10 VITALS — BP 113/77 | HR 101 | Temp 98.5°F | Resp 20

## 2019-03-10 DIAGNOSIS — C61 Malignant neoplasm of prostate: Secondary | ICD-10-CM

## 2019-03-10 DIAGNOSIS — C7931 Secondary malignant neoplasm of brain: Secondary | ICD-10-CM

## 2019-03-10 LAB — CBC
HCT: 39.2 % (ref 36.0–46.0)
Hemoglobin: 13.2 g/dL (ref 12.0–15.0)
MCH: 30.2 pg (ref 26.0–34.0)
MCHC: 33.7 g/dL (ref 30.0–36.0)
MCV: 89.7 fL (ref 80.0–100.0)
Platelets: 334 10*3/uL (ref 150–400)
RBC: 4.37 MIL/uL (ref 3.87–5.11)
RDW: 12.3 % (ref 11.5–15.5)
WBC: 8.4 10*3/uL (ref 4.0–10.5)
nRBC: 0 % (ref 0.0–0.2)

## 2019-03-10 LAB — BASIC METABOLIC PANEL
Anion gap: 10 (ref 5–15)
BUN: 10 mg/dL (ref 8–23)
CO2: 28 mmol/L (ref 22–32)
Calcium: 10.1 mg/dL (ref 8.9–10.3)
Chloride: 99 mmol/L (ref 98–111)
Creatinine, Ser: 0.58 mg/dL (ref 0.44–1.00)
GFR calc Af Amer: >60 mL/min (ref >60)
GFR calc non Af Amer: >60 mL/min (ref >60)
Glucose, Bld: 121 mg/dL — ABNORMAL HIGH (ref 70–99)
Potassium: 4.9 mmol/L (ref 3.5–5.1)
Sodium: 137 mmol/L (ref 135–145)

## 2019-03-10 NOTE — Progress Notes (Signed)
OT Cancellation Note  Patient Details Name: [name] MRN: [id number] DOB: Apr 01, 1941   Cancelled Treatment:    Reason Eval/Treat Not Completed: Patient at procedure or test/ unavailable. Will follow and see as able.   Delight Stare, OT Acute Rehabilitation Services Pager [phone] Office [phone]   Delight Stare 03/10/2019, 10:27 AM

## 2019-03-10 NOTE — Care Management Important Message (Signed)
Important Message  Patient Details  Name: [name] MRN: [id number] Date of Birth: 04-28-1941   Medicare Important Message Given:  Yes    Orbie Pyo 03/10/2019, 12:32 PM

## 2019-03-10 NOTE — Progress Notes (Signed)
STROKE TEAM PROGRESS NOTE     SUBJECTIVE (INTERVAL HISTORY) Patient back from [facility] after stimulation.  Sitting in bed, no neuro changes, no complaints.  PT/OT skilled recommend SNF.  However SNF request was denied by insurance.  OBJECTIVE Vitals:   03/09/19 2303 03/10/19 0331 03/10/19 1232 03/10/19 1619  BP: 124/77 110/69 125/72 107/72  Pulse: 85 70 78 90  Resp: 18 18 16 15   Temp: 98.2 F (36.8 C) 98.1 F (36.7 C) 97.9 F (36.6 C) 98 F (36.7 C)  TempSrc: Oral Oral Oral Axillary  SpO2: 93% 97% 96% 95%  Weight:      Height:        CBC:  Recent Labs  Lab 03/09/19 0739 03/10/19 0324  WBC 6.5 8.4  HGB 13.4 13.2  HCT 40.5 39.2  MCV 89.8 89.7  PLT 334 024    Basic Metabolic Panel:  Recent Labs  Lab 03/09/19 0739 03/10/19 0324  NA 134* 137  K 4.2 4.9  CL 96* 99  CO2 29 28  GLUCOSE 95 121*  BUN 11 10  CREATININE 0.57 0.58  CALCIUM 9.8 10.1    Lipid Panel:     Component Value Date/Time   CHOL 220 (H) 03/08/2019 0426   TRIG 57 03/08/2019 0426   HDL 54 03/08/2019 0426   CHOLHDL 4.1 03/08/2019 0426   VLDL 11 03/08/2019 0426   LDLCALC 155 (H) 03/08/2019 0426   HgbA1c:  Lab Results  Component Value Date   HGBA1C 5.1 03/08/2019   Urine Drug Screen:     Component Value Date/Time   LABOPIA NONE DETECTED 03/06/2019 0332   COCAINSCRNUR NONE DETECTED 03/06/2019 0332   LABBENZ POSITIVE (A) 03/06/2019 0332   AMPHETMU NONE DETECTED 03/06/2019 0332   THCU NONE DETECTED 03/06/2019 0332   LABBARB NONE DETECTED 03/06/2019 0332    Alcohol Level     Component Value Date/Time   ETH <10 03/03/2019 1352    IMAGING  Ct Abdomen Pelvis Wo Contrast  Result Date: 03/06/2019 CLINICAL DATA:  Acute onset generalized pain today. EXAM: CT CHEST, ABDOMEN AND PELVIS WITHOUT CONTRAST TECHNIQUE: Multidetector CT imaging of the chest, abdomen and pelvis was performed following the standard protocol without IV contrast. COMPARISON:  CT chest 05/06/2018 and  05/09/2017. FINDINGS: CT CHEST FINDINGS Cardiovascular: Heart size is normal. No pericardial effusion. Calcific aortic atherosclerosis noted. Mediastinum/Nodes: No enlarged mediastinal, hilar, or axillary lymph nodes. Thyroid gland, trachea, and esophagus demonstrate no significant findings. Lungs/Pleura: Extensive centrilobular emphysematous disease is identified. A 0.4 cm nodule in the superior segment of the right lower lobe on image 65 is unchanged. No new or enlarging pulmonary nodule is identified. There is some dependent airspace disease which is greater on the right. Suture material in the left lower lobe noted. Musculoskeletal: Severe T6 compression fracture and superior endplate compression fracture T8 are unchanged. Pedicle screws and stabilization bars from T10-L2 noted. Severe T12 and L1 compression fractures are unchanged. No new fracture identified. No lytic or sclerotic lesion. CT ABDOMEN PELVIS FINDINGS Hepatobiliary: No focal liver abnormality is seen. Status post cholecystectomy. No biliary dilatation. Pancreas: Unremarkable. No pancreatic ductal dilatation or surrounding inflammatory changes. Spleen: Normal in size without focal abnormality. Adrenals/Urinary Tract: Punctate parenchymal calcification the right kidney is unchanged. The kidneys otherwise appear. Ureters and urinary are unremarkable. Adrenal glands are. Stomach/Bowel: Stomach is within normal limits. Appendix appears normal. No evidence of bowel wall thickening, distention, or inflammatory changes. Scattered diverticulosis noted. Vascular/Lymphatic: Extensive atherosclerotic vascular disease is present. The descending  abdominal aorta measures up to 3.4 cm in diameter and the right common iliac artery measures 1.4 cm. Reproductive: Status post hysterectomy. No adnexal masses. Other: None. Musculoskeletal: There is a mild compression fracture deformity of L3 which appears remote. There is convex right lumbar scoliosis and multilevel  degenerative change. Advanced right hip osteoarthritis is noted. IMPRESSION: Dependent bibasilar airspace disease is greater on the right and likely represents atelectasis but could be secondary to pneumonia. No other finding to suggest acute abnormality is seen in the chest, abdomen or pelvis. Emphysema. Atherosclerosis. 3.4 cm abdominal aortic aneurysm is identified. Recommend followup by ultrasound in 3 years. This recommendation follows ACR consensus guidelines: White Paper of the ACR Incidental Findings Committee II on Vascular Findings. J Am Coll Radiol 2013; 10:789-794. Diverticulosis diverticulitis. Remote thoracic and lumbar spine compression fractures. Electronically Signed   By: Inge Rise M.D.   On: 03/06/2019 17:40   Ct Head Wo Contrast  Result Date: 03/04/2019 CLINICAL DATA:  Intracranial hemorrhage follow-up EXAM: CT HEAD WITHOUT CONTRAST TECHNIQUE: Contiguous axial images were obtained from the base of the skull through the vertex without intravenous contrast. COMPARISON:  Brain MRI from yesterday FINDINGS: Brain: Up to 4.2 Cm (craniocaudal) parenchymal hematoma in the left parietal lobe that is unchanged in extent when compared to brain MRI yesterday. There is a rim of edema that is also stable. Local swelling without midline shift. No hydrocephalus or extra-axial collection. No visible infarct Vascular: Atherosclerotic calcification Skull: No acute or aggressive finding Sinuses/Orbits: Bilateral cataract resection. IMPRESSION: Size stable left parietal hematoma with edema. No new abnormality or midline shift. Electronically Signed   By: Monte Fantasia M.D.   On: 03/04/2019 05:25   Ct Chest Wo Contrast  Result Date: 03/06/2019 CLINICAL DATA:  Acute onset generalized pain today. EXAM: CT CHEST, ABDOMEN AND PELVIS WITHOUT CONTRAST TECHNIQUE: Multidetector CT imaging of the chest, abdomen and pelvis was performed following the standard protocol without IV contrast. COMPARISON:  CT chest  05/06/2018 and 05/09/2017. FINDINGS: CT CHEST FINDINGS Cardiovascular: Heart size is normal. No pericardial effusion. Calcific aortic atherosclerosis noted. Mediastinum/Nodes: No enlarged mediastinal, hilar, or axillary lymph nodes. Thyroid gland, trachea, and esophagus demonstrate no significant findings. Lungs/Pleura: Extensive centrilobular emphysematous disease is identified. A 0.4 cm nodule in the superior segment of the right lower lobe on image 65 is unchanged. No new or enlarging pulmonary nodule is identified. There is some dependent airspace disease which is greater on the right. Suture material in the left lower lobe noted. Musculoskeletal: Severe T6 compression fracture and superior endplate compression fracture T8 are unchanged. Pedicle screws and stabilization bars from T10-L2 noted. Severe T12 and L1 compression fractures are unchanged. No new fracture identified. No lytic or sclerotic lesion. CT ABDOMEN PELVIS FINDINGS Hepatobiliary: No focal liver abnormality is seen. Status post cholecystectomy. No biliary dilatation. Pancreas: Unremarkable. No pancreatic ductal dilatation or surrounding inflammatory changes. Spleen: Normal in size without focal abnormality. Adrenals/Urinary Tract: Punctate parenchymal calcification the right kidney is unchanged. The kidneys otherwise appear. Ureters and urinary are unremarkable. Adrenal glands are. Stomach/Bowel: Stomach is within normal limits. Appendix appears normal. No evidence of bowel wall thickening, distention, or inflammatory changes. Scattered diverticulosis noted. Vascular/Lymphatic: Extensive atherosclerotic vascular disease is present. The descending abdominal aorta measures up to 3.4 cm in diameter and the right common iliac artery measures 1.4 cm. Reproductive: Status post hysterectomy. No adnexal masses. Other: None. Musculoskeletal: There is a mild compression fracture deformity of L3 which appears remote. There is convex right lumbar scoliosis  and multilevel degenerative change. Advanced right hip osteoarthritis is noted. IMPRESSION: Dependent bibasilar airspace disease is greater on the right and likely represents atelectasis but could be secondary to pneumonia. No other finding to suggest acute abnormality is seen in the chest, abdomen or pelvis. Emphysema. Atherosclerosis. 3.4 cm abdominal aortic aneurysm is identified. Recommend followup by ultrasound in 3 years. This recommendation follows ACR consensus guidelines: White Paper of the ACR Incidental Findings Committee II on Vascular Findings. J Am Coll Radiol 2013; 10:789-794. Diverticulosis diverticulitis. Remote thoracic and lumbar spine compression fractures. Electronically Signed   By: Inge Rise M.D.   On: 03/06/2019 17:40   Mr Virgel Paling HA Contrast  Result Date: 03/04/2019 CLINICAL DATA:  Initial evaluation for acute aphasia, right-sided weakness, confusion. EXAM: MRI HEAD WITHOUT CONTRAST MRA HEAD WITHOUT CONTRAST MRA NECK WITHOUT AND WITH CONTRAST TECHNIQUE: Multiplanar, multiecho pulse sequences of the brain and surrounding structures were obtained without intravenous contrast. Angiographic images of the Circle of Willis were obtained using MRA technique without intravenous contrast. Angiographic images of the neck were obtained using MRA technique without and with intravenous contrast. Carotid stenosis measurements (when applicable) are obtained utilizing NASCET criteria, using the distal internal carotid diameter as the denominator. CONTRAST:  5.7 cc Gadavist. COMPARISON:  Prior CT from earlier the same day. FINDINGS: MRI HEAD FINDINGS Cerebral volume within normal limits for age. Minimal T2/FLAIR hyperintensity noted within the periventricular white matter, nonspecific, felt to be within normal limits for age. Previously identified intraparenchymal hemorrhage centered at the left parietal lobe again seen, measuring 2.9 x 4.0 x 4.3 cm (estimated volume 25 cc). Surrounding vasogenic  edema with mild regional mass effect. No midline shift. No intraventricular or extra-axial extension. No discernible underlying mass lesion or other abnormality. No significant dot burden to suggest cerebral amyloid angiopathy. No findings to suggest overlying trauma. Etiology unclear. No other abnormal foci of restricted diffusion to suggest acute or subacute ischemia. Gray-white matter differentiation otherwise maintained. No encephalomalacia to suggest chronic cortical infarction. No other acute or chronic intracranial hemorrhage. No mass lesion or midline shift. No hydrocephalus. No extra-axial fluid collection. Pituitary gland suprasellar region normal. Midline structures intact. Major intracranial vascular flow voids maintained. Craniocervical junction normal. No focal marrow replacing lesion. Scalp soft tissues unremarkable. Patient status post bilateral ocular lens replacement. Paranasal sinuses are clear. Small left mastoid effusion noted, of doubtful significance. MRA HEAD FINDINGS ANTERIOR CIRCULATION: Examination moderately degraded by motion artifact. Distal cervical segments of the internal carotid arteries are patent with symmetric antegrade flow. Distal cervical left ICA tortuous. Petrous, cavernous, and supraclinoid segments patent without appreciable stenosis. A1 segments patent bilaterally. Anterior cerebral arteries patent to their distal aspects without definite stenosis. Anterior communicating artery not well assessed on this motion degraded exam. No M1 occlusion. Possible short-segment mild distal left M1 stenosis noted (series 1052, image 11). Normal MCA bifurcations. Distal MCA branches well perfused and grossly symmetric. No vascular abnormality seen underlying the partially visualized left parietal hematoma. POSTERIOR CIRCULATION: Vertebral arteries patent to the vertebrobasilar junction without stenosis. Vertebral arteries largely code dominant. Basilar widely patent to its distal aspect.  Superior cerebral arteries patent bilaterally. Both of the posterior cerebral arteries primarily supplied via the basilar and are well perfused to their distal aspects. No definite flow-limiting stenosis. Prominent left posterior communicating artery noted. No visible aneurysm or other vascular abnormality on this motion degraded exam. MRA NECK FINDINGS Examination technically limited by motion artifact. Visualized aortic arch of normal caliber with normal 3 vessel morphology. No  flow-limiting stenosis about the origin of the great vessels. Visualized subclavian arteries widely patent. Right common carotid artery widely patent from its origin to the bifurcation. Mild atheromatous irregularity about the right bifurcation without hemodynamically significant stenosis. Right ICA patent distally to the circle-of-Willis without stenosis or occlusion. Left common carotid artery patent from its origin to the bifurcation without stenosis. No significant atheromatous narrowing about the left bifurcation. Left ICA tortuous but patent to the circle-of-Willis without stenosis or occlusion. Both of the vertebral arteries arise from the subclavian arteries. Vertebral arteries largely code dominant. Probable short-segment moderate stenosis at the origin of the left vertebral artery (series 1084, image 14). Vertebral arteries otherwise widely patent within the neck without stenosis or occlusion. IMPRESSION: MRI HEAD IMPRESSION: 1. Stable size of acute intraparenchymal hemorrhage centered at the left parietal convexity, estimated volume 25 cc. Surrounding vasogenic edema with mild regional mass effect without midline shift. No identifiable etiology identified on this exam. 2. Otherwise normal brain MRI for age. MRA HEAD IMPRESSION: 1. Technically limited exam due to moderate motion degradation. 2. Grossly negative intracranial MRA. No large vessel occlusion, hemodynamically significant stenosis, or other acute vascular process. No  vascular abnormality seen underlying the left parietal hemorrhage. MRA NECK IMPRESSION: 1. Wide patency of the carotid artery systems bilaterally within the neck. 2. Short-segment moderate stenosis at the origin of the left vertebral artery. Vertebral arteries otherwise widely patent within the neck. Vertebral arteries are co dominant. Electronically Signed   By: Jeannine Boga M.D.   On: 03/04/2019 01:21   Mr Jodene Nam Neck W Wo Contrast  Result Date: 03/04/2019 CLINICAL DATA:  Initial evaluation for acute aphasia, right-sided weakness, confusion. EXAM: MRI HEAD WITHOUT CONTRAST MRA HEAD WITHOUT CONTRAST MRA NECK WITHOUT AND WITH CONTRAST TECHNIQUE: Multiplanar, multiecho pulse sequences of the brain and surrounding structures were obtained without intravenous contrast. Angiographic images of the Circle of Willis were obtained using MRA technique without intravenous contrast. Angiographic images of the neck were obtained using MRA technique without and with intravenous contrast. Carotid stenosis measurements (when applicable) are obtained utilizing NASCET criteria, using the distal internal carotid diameter as the denominator. CONTRAST:  5.7 cc Gadavist. COMPARISON:  Prior CT from earlier the same day. FINDINGS: MRI HEAD FINDINGS Cerebral volume within normal limits for age. Minimal T2/FLAIR hyperintensity noted within the periventricular white matter, nonspecific, felt to be within normal limits for age. Previously identified intraparenchymal hemorrhage centered at the left parietal lobe again seen, measuring 2.9 x 4.0 x 4.3 cm (estimated volume 25 cc). Surrounding vasogenic edema with mild regional mass effect. No midline shift. No intraventricular or extra-axial extension. No discernible underlying mass lesion or other abnormality. No significant dot burden to suggest cerebral amyloid angiopathy. No findings to suggest overlying trauma. Etiology unclear. No other abnormal foci of restricted diffusion to  suggest acute or subacute ischemia. Gray-white matter differentiation otherwise maintained. No encephalomalacia to suggest chronic cortical infarction. No other acute or chronic intracranial hemorrhage. No mass lesion or midline shift. No hydrocephalus. No extra-axial fluid collection. Pituitary gland suprasellar region normal. Midline structures intact. Major intracranial vascular flow voids maintained. Craniocervical junction normal. No focal marrow replacing lesion. Scalp soft tissues unremarkable. Patient status post bilateral ocular lens replacement. Paranasal sinuses are clear. Small left mastoid effusion noted, of doubtful significance. MRA HEAD FINDINGS ANTERIOR CIRCULATION: Examination moderately degraded by motion artifact. Distal cervical segments of the internal carotid arteries are patent with symmetric antegrade flow. Distal cervical left ICA tortuous. Petrous, cavernous, and supraclinoid segments patent without  appreciable stenosis. A1 segments patent bilaterally. Anterior cerebral arteries patent to their distal aspects without definite stenosis. Anterior communicating artery not well assessed on this motion degraded exam. No M1 occlusion. Possible short-segment mild distal left M1 stenosis noted (series 1052, image 11). Normal MCA bifurcations. Distal MCA branches well perfused and grossly symmetric. No vascular abnormality seen underlying the partially visualized left parietal hematoma. POSTERIOR CIRCULATION: Vertebral arteries patent to the vertebrobasilar junction without stenosis. Vertebral arteries largely code dominant. Basilar widely patent to its distal aspect. Superior cerebral arteries patent bilaterally. Both of the posterior cerebral arteries primarily supplied via the basilar and are well perfused to their distal aspects. No definite flow-limiting stenosis. Prominent left posterior communicating artery noted. No visible aneurysm or other vascular abnormality on this motion degraded  exam. MRA NECK FINDINGS Examination technically limited by motion artifact. Visualized aortic arch of normal caliber with normal 3 vessel morphology. No flow-limiting stenosis about the origin of the great vessels. Visualized subclavian arteries widely patent. Right common carotid artery widely patent from its origin to the bifurcation. Mild atheromatous irregularity about the right bifurcation without hemodynamically significant stenosis. Right ICA patent distally to the circle-of-Willis without stenosis or occlusion. Left common carotid artery patent from its origin to the bifurcation without stenosis. No significant atheromatous narrowing about the left bifurcation. Left ICA tortuous but patent to the circle-of-Willis without stenosis or occlusion. Both of the vertebral arteries arise from the subclavian arteries. Vertebral arteries largely code dominant. Probable short-segment moderate stenosis at the origin of the left vertebral artery (series 1084, image 14). Vertebral arteries otherwise widely patent within the neck without stenosis or occlusion. IMPRESSION: MRI HEAD IMPRESSION: 1. Stable size of acute intraparenchymal hemorrhage centered at the left parietal convexity, estimated volume 25 cc. Surrounding vasogenic edema with mild regional mass effect without midline shift. No identifiable etiology identified on this exam. 2. Otherwise normal brain MRI for age. MRA HEAD IMPRESSION: 1. Technically limited exam due to moderate motion degradation. 2. Grossly negative intracranial MRA. No large vessel occlusion, hemodynamically significant stenosis, or other acute vascular process. No vascular abnormality seen underlying the left parietal hemorrhage. MRA NECK IMPRESSION: 1. Wide patency of the carotid artery systems bilaterally within the neck. 2. Short-segment moderate stenosis at the origin of the left vertebral artery. Vertebral arteries otherwise widely patent within the neck. Vertebral arteries are co  dominant. Electronically Signed   By: Jeannine Boga M.D.   On: 03/04/2019 01:21   Mr Brain Wo Contrast  Result Date: 03/04/2019 CLINICAL DATA:  Initial evaluation for acute aphasia, right-sided weakness, confusion. EXAM: MRI HEAD WITHOUT CONTRAST MRA HEAD WITHOUT CONTRAST MRA NECK WITHOUT AND WITH CONTRAST TECHNIQUE: Multiplanar, multiecho pulse sequences of the brain and surrounding structures were obtained without intravenous contrast. Angiographic images of the Circle of Willis were obtained using MRA technique without intravenous contrast. Angiographic images of the neck were obtained using MRA technique without and with intravenous contrast. Carotid stenosis measurements (when applicable) are obtained utilizing NASCET criteria, using the distal internal carotid diameter as the denominator. CONTRAST:  5.7 cc Gadavist. COMPARISON:  Prior CT from earlier the same day. FINDINGS: MRI HEAD FINDINGS Cerebral volume within normal limits for age. Minimal T2/FLAIR hyperintensity noted within the periventricular white matter, nonspecific, felt to be within normal limits for age. Previously identified intraparenchymal hemorrhage centered at the left parietal lobe again seen, measuring 2.9 x 4.0 x 4.3 cm (estimated volume 25 cc). Surrounding vasogenic edema with mild regional mass effect. No midline shift. No intraventricular or  extra-axial extension. No discernible underlying mass lesion or other abnormality. No significant dot burden to suggest cerebral amyloid angiopathy. No findings to suggest overlying trauma. Etiology unclear. No other abnormal foci of restricted diffusion to suggest acute or subacute ischemia. Gray-white matter differentiation otherwise maintained. No encephalomalacia to suggest chronic cortical infarction. No other acute or chronic intracranial hemorrhage. No mass lesion or midline shift. No hydrocephalus. No extra-axial fluid collection. Pituitary gland suprasellar region normal. Midline  structures intact. Major intracranial vascular flow voids maintained. Craniocervical junction normal. No focal marrow replacing lesion. Scalp soft tissues unremarkable. Patient status post bilateral ocular lens replacement. Paranasal sinuses are clear. Small left mastoid effusion noted, of doubtful significance. MRA HEAD FINDINGS ANTERIOR CIRCULATION: Examination moderately degraded by motion artifact. Distal cervical segments of the internal carotid arteries are patent with symmetric antegrade flow. Distal cervical left ICA tortuous. Petrous, cavernous, and supraclinoid segments patent without appreciable stenosis. A1 segments patent bilaterally. Anterior cerebral arteries patent to their distal aspects without definite stenosis. Anterior communicating artery not well assessed on this motion degraded exam. No M1 occlusion. Possible short-segment mild distal left M1 stenosis noted (series 1052, image 11). Normal MCA bifurcations. Distal MCA branches well perfused and grossly symmetric. No vascular abnormality seen underlying the partially visualized left parietal hematoma. POSTERIOR CIRCULATION: Vertebral arteries patent to the vertebrobasilar junction without stenosis. Vertebral arteries largely code dominant. Basilar widely patent to its distal aspect. Superior cerebral arteries patent bilaterally. Both of the posterior cerebral arteries primarily supplied via the basilar and are well perfused to their distal aspects. No definite flow-limiting stenosis. Prominent left posterior communicating artery noted. No visible aneurysm or other vascular abnormality on this motion degraded exam. MRA NECK FINDINGS Examination technically limited by motion artifact. Visualized aortic arch of normal caliber with normal 3 vessel morphology. No flow-limiting stenosis about the origin of the great vessels. Visualized subclavian arteries widely patent. Right common carotid artery widely patent from its origin to the bifurcation.  Mild atheromatous irregularity about the right bifurcation without hemodynamically significant stenosis. Right ICA patent distally to the circle-of-Willis without stenosis or occlusion. Left common carotid artery patent from its origin to the bifurcation without stenosis. No significant atheromatous narrowing about the left bifurcation. Left ICA tortuous but patent to the circle-of-Willis without stenosis or occlusion. Both of the vertebral arteries arise from the subclavian arteries. Vertebral arteries largely code dominant. Probable short-segment moderate stenosis at the origin of the left vertebral artery (series 1084, image 14). Vertebral arteries otherwise widely patent within the neck without stenosis or occlusion. IMPRESSION: MRI HEAD IMPRESSION: 1. Stable size of acute intraparenchymal hemorrhage centered at the left parietal convexity, estimated volume 25 cc. Surrounding vasogenic edema with mild regional mass effect without midline shift. No identifiable etiology identified on this exam. 2. Otherwise normal brain MRI for age. MRA HEAD IMPRESSION: 1. Technically limited exam due to moderate motion degradation. 2. Grossly negative intracranial MRA. No large vessel occlusion, hemodynamically significant stenosis, or other acute vascular process. No vascular abnormality seen underlying the left parietal hemorrhage. MRA NECK IMPRESSION: 1. Wide patency of the carotid artery systems bilaterally within the neck. 2. Short-segment moderate stenosis at the origin of the left vertebral artery. Vertebral arteries otherwise widely patent within the neck. Vertebral arteries are co dominant. Electronically Signed   By: Jeannine Boga M.D.   On: 03/04/2019 01:21   Mr Brain W Contrast  Result Date: 03/04/2019 CLINICAL DATA:  Intracranial hemorrhage.  History of lung cancer. EXAM: MRI HEAD WITH CONTRAST TECHNIQUE: Multiplanar, multiecho  pulse sequences of the brain and surrounding structures were obtained with  intravenous contrast. CONTRAST:  6 mL Gadavist COMPARISON:  Noncontrast MRI of the brain 03/03/2019 FINDINGS: Postcontrast T1-weighted imaging of the brain shows multiple intraparenchymal contrast-enhancing lesions. 1. Left cerebellum 5 x 5 mm, 2:40 2. Right temporal lobe 2 mm, 2:51 3. Peripheral contrast enhancement at the site of the known intraparenchymal hematoma in the posterior left parietal lobe, altogether measuring 3.7 x 2.9 cm, 2:87 4. Posterior left insula 5 x 5 mm, 2:91 5. Left frontal precentral gyrus 6 x 6 mm, 2:124 No contrast-enhancing lesions of the calvarium or extracranial soft tissues. IMPRESSION: 1. Five intraparenchymal metastatic lesions, the largest of which is located in the posterior left parietal lobe where the associated hemorrhage makes accurate measurement difficult. 2. Unchanged size of hematoma. Electronically Signed   By: Ulyses Jarred M.D.   On: 03/04/2019 17:57   Mr Jeri Cos CZ Contrast  Result Date: 03/09/2019 CLINICAL DATA:  Brain metastases with history of lung cancer. Brain lab and SRS protocol for treatment planning. EXAM: MRI HEAD WITHOUT AND WITH CONTRAST TECHNIQUE: Multiplanar, multiecho pulse sequences of the brain and surrounding structures were obtained without and with intravenous contrast. CONTRAST:  6 mL Gadavist COMPARISON:  Postcontrast brain MRI 03/04/2019 and noncontrast brain MRI 03/03/2019 FINDINGS: Brain: Postcontrast sequences are mildly to moderately motion degraded which reduces sensitivity for detection of small lesions. The hemorrhagic left parieto-occipital mass has not significantly changed in size upon remeasurement, currently measuring 4.1 x 3.2 x 4.6 cm. Mild-to-moderate surrounding edema is stable to minimally increased with mild mass effect on the left lateral ventricle but no significant midline shift. Smaller unchanged lesions measure 3 mm in the left cerebellum (series 11, image 37), 5 mm in the posterior left insula (series 11, image 79), and  6 mm in the posterior left frontal lobe (series 11, image 113). The 2 mm right temporal lobe lesion is not well seen due to motion artifact but is faintly visible on the coronal sequence (series 12, image 27). No new lesions are identified. No acute infarct or extra-axial fluid collection is evident. There is a background of mild chronic small vessel ischemic disease in the cerebral white matter and pons. Mild cerebral atrophy is not greater than expected for age. Vascular: Major intracranial vascular flow voids are preserved. 3 mm focus of enhancement in a left parietal sulcus (series 11, image 84) is associated with a vessel and is most compatible with an aneurysm rather than metastasis. Skull and upper cervical spine: No suspicious marrow lesion. Sinuses/Orbits: Bilateral cataract extraction. Clear paranasal sinuses. Small left mastoid effusion. Other: None. IMPRESSION: 1. Motion degraded postcontrast imaging. 2. 5 intracranial metastases including a hemorrhagic left parieto-occipital lesion with mild-to-moderate edema. 3. 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 Electronically Signed   By: Logan Bores M.D.   On: 03/09/2019 17:02   Ct Head Code Stroke Wo Contrast  Result Date: 03/03/2019 CLINICAL DATA:  Code stroke. Acute onset of aphasia and right-sided weakness. EXAM: CT HEAD WITHOUT CONTRAST TECHNIQUE: Contiguous axial images were obtained from the base of the skull through the vertex without intravenous contrast. COMPARISON:  CT head without contrast 09/11/2016 FINDINGS: Brain: An acute left parietal and occipital hemorrhage measures 3.5 x 3.7 x 3.0 cm. There is surrounding edema with effacement of the adjacent sulci and minimal mass effect on the posterior horn of the left lateral ventricle. 3  mm midline shift is present. No other focal hemorrhage is present. No other mass lesion or acute infarct is evident. Basal  ganglia are intact. Insular ribbon is normal. No significant extra-axial fluid collection is present. Vascular: Minimal calcifications are present. There is no hyperdense vessel. Skull: Calvarium is intact. No focal lytic or blastic lesions are present. Sinuses/Orbits: The paranasal sinuses and mastoid air cells are clear. Bilateral lens replacements are noted. Globes and orbits are otherwise unremarkable. IMPRESSION: 1. Left parietal and occipital parenchymal hematoma measuring up to 3.7 cm. This is nonspecific. It is most likely related to hypertension or underlying vasculitis in a patient of this age. The differential diagnosis includes metastatic disease or other vascular malformation. No other lesions are evident. 2. Surrounding edema and mass effect is mostly local with minimal midline shift of 3-4 mm. 3. No other focal lesions or hemorrhage. Critical Value/emergent results were called by telephone at the time of interpretation on 03/03/2019 at 1:39 pm to Dr. Gerlene Fee , who verbally acknowledged these results. Electronically Signed   By: San Morelle M.D.   On: 03/03/2019 13:46   Transthoracic Echocardiogram  02/22/2019 normal ejection fraction 60 to 65%.  No wall motion abnormalities.    PHYSICAL EXAM   Temp:  [97.9 F (36.6 C)-98.5 F (36.9 C)] 98 F (36.7 C) (06/02 1619) Pulse Rate:  [70-101] 90 (06/02 1619) Resp:  [15-20] 15 (06/02 1619) BP: (99-125)/(64-91) 107/72 (06/02 1619) SpO2:  [93 %-97 %] 95 % (06/02 1619)  General - Well nourished, well developed, in no apparent distress.  Ophthalmologic - fundi not visualized due to noncooperation.  Cardiovascular - Regular rate and rhythm.  Mental Status -  Level of arousal and orientation to time, place, and person were intact. Language including expression, naming, repetition, comprehension was assessed and found intact.  Cranial Nerves II - XII - II - Visual field intact OU. III, IV, VI - Extraocular movements intact. V  - Facial sensation intact bilaterally. VII - Facial movement intact bilaterally. VIII - Hearing & vestibular intact bilaterally. X - Palate elevates symmetrically. XI - Chin turning & shoulder shrug intact bilaterally. XII - Tongue protrusion intact.  Motor Strength - The patient's strength was normal in all extremities and pronator drift was absent.  Bulk was normal and fasciculations were absent.   Motor Tone - Muscle tone was assessed at the neck and appendages and was normal.  Reflexes - The patient's reflexes were symmetrical in all extremities and she had no pathological reflexes.  Sensory - Light touch, temperature/pinprick were assessed and were symmetrical.    Coordination - The patient had normal movements in the hands with no ataxia or dysmetria.  Tremor was absent  Gait and Station - deferred.   ASSESSMENT/PLAN Ms. DELVINA MIZZELL is a 78 y.o. female with history of lung cancer, trigeminal neuralgia, on home O2, IBS, dysphagia, diastolic dysfunction, COPD, aortic aneurysm and anxiety presenting with left parietal/occipital ICH. Possible etiology is HTN, yet given her cancer history MRI with contrast shows multiple enhancing metastatic lesions with hemorrhage into one such lesion as a cause of her intracerebral hemorrhage. Neuro Oncology consult.  ICH - left parietal lobar hematoma - Etiology could be due to underlying metastatic hemorrhage given history of lung cancer and MRI confirmed brain metastases lesions  CT head left parietal and occipital ICH  MRI head -stable IPH ~25cc volume; some surrounding vasogenic edema, no evidence of typical CAA  MRA head - neg for underlying vascular process  MRI brain  with contrast : At least 5 enhancing metastatic lesions with the largest one in the left posterior parietal at the location of the hemorrhage  Repeat MRI brain with and without contrast per [facility] protocol 5 intracranial mets w/ hemorrhagic L parieto-occipital lesion w/ mid to  mod edema. 31mm L parietal sulcus c/w aneurysm.  2D Echo - EF 60 to 65%  [employer] Virus 2 neg  LDL 155  HgbA1c 5.1  VTE prophylaxis - SCDs  No thinners or ASA prior to admit; currently not indicated  Therapy recommendations:  SNF  Disposition:  SNF - denied by insurance  Brain metastasis  History of lung cancer  MRI w/gad - 5 small/punctate enhancement underlying metastatic lesions.   Repeat MRI brain with and without contrast per [facility] protocol confirm 5 intracranial mets  Neuro Onc consulted with plan for biopsy on Monday, 03/16/2019.   Neurosurgery Kathyrn Sheriff) consulted and plans for stereotactic radiosurgery Tues 6/9 followed by surgical resection of the large L parietal-occipital lesion on 6/10   CT chest/abdomen/pelvis showed right more than left airspace disease, no metastasis  Neurosurgeon ok for d/c and return next week  Hypertension  Stable  BP goal < 160 . 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 early in 2014   Other Problems  3.4 cm abdominal aortic aneurysm is identified. Recommend followup by ultrasound in 3 years.   COPD on home oxygen  Trigeminal neuralgia  Hospital day # 7   Rosalin Hawking, MD PhD Stroke Neurology 03/10/2019 4:41 PM  To contact Stroke Continuity provider, please refer to [email or web address] After hours, contact General Neurology

## 2019-03-10 NOTE — Progress Notes (Signed)
  Speech Language Pathology Treatment: Cognitive-Linquistic(Aphasia )  Patient Details Name: [name] MRN: [id number] DOB: 19-Dec-1940 Today's Date: 03/10/2019 Time: 5789-7847 SLP Time Calculation (min) (ACUTE ONLY): 26 min  Assessment / Plan / Recommendation Clinical Impression  Pt was seen for aphasia treatment and was cooperative throughout the session without complaint of pain. She was able to recall being seen by this SLP last week and expressed that her language skills have improved but that she has noticed some difficulty making phone calls due to her trouble identifying the incorrect numbers and being able to sequence them correctly. Some errors were noted during structured tasks with her identifying visually-similar numbers such as 6 instead of 9 or selecting the adjacent number but no other significant difficulty was observed. She provided 10-12 items per abstract category during divergent naming tasks with an average of 11 items per category. She achieved 80% accuracy with responsive naming of lower frequency words increasing to 100% accuracy with min. cues. She demonstrated 80% accuracy with auditory comprehension of simple and moderately complex paragraphs increasing to 100% accuracy with min. cues. A 3-activity sequencing task was completed and the pt required moderate support to facilitate accuracyy. Pt and RN reported that the current plan is for the pt to be discharged today. She will benefit from continued SLP services following discharge to further improve word retrieval difficulty and assess cognition.    HPI HPI: Pt is a 78 y.o. female with history of trigeminal neuralgia, on home O2, IBS, dysphagia, diastolic dysfunction, COPD, aortic aneurysm and anxiety.  Patient [facility] ED as a code stroke secondary to aphasia and right-sided weakness. She was transferred to [facility] for further care. MRI of the brain revealed acute intraparenchymal hemorrhage centered at the  left parietal convexity, estimated volume 25 cc. Surrounding vasogenic edema with mild regional mass effect without midline shift.      SLP Plan  Continue with current plan of care       Recommendations                   Follow up Recommendations: Skilled Nursing facility SLP Visit Diagnosis: Aphasia (R47.01) Plan: Continue with current plan of care       Arek Spadafore I. [name], [place], [place] Office number [phone] Pager [place] 03/10/2019, 3:51 PM

## 2019-03-10 NOTE — Progress Notes (Signed)
PT Cancellation Note  Patient Details Name: [name] MRN: [id number] DOB: 05-07-1941   Cancelled Treatment:    Reason Eval/Treat Not Completed: Fatigue/lethargy limiting ability to participate.  Pt is noting a great deal of fatigue after being over to [facility] to get pre-treatment visit in for CA treatment.  Will be leaving to go to rehab today per her report, and will anticipate checking on her tomorrow if she is not discharged today.   Ramond Dial 03/10/2019, 2:44 PM   Mee Hives, PT MS Acute Rehab Dept. Number: [place]

## 2019-03-10 NOTE — Progress Notes (Signed)
Occupational Therapy Treatment Patient Details Name: [name] MRN: [id number] DOB: October 02, 1941 Today's Date: 03/10/2019    History of present illness Pt is a 78 y/o female presents with aphasia and R sided weakness. 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 Will likely have surgery to address mets; neurosurgery following. PMH: anxiety, COPD on home oxgyen, depression, dsyphagia, trigeminal neuralgia, aortic aneurysm.     OT comments  Patient progressing well with mobility and self care.  Min guard for transfers, mobility using RW and toileting.  Next session pt will benefit from further cognitive assessment and visual assessment, as reports difficulty sequencing dialing phone numbers.  Presenting with increased awareness and ability to follow commands today. Very pleasant.  Will follow. DC plan updated to SNF.   Follow Up Recommendations  Supervision/Assistance - 24 hour;SNF    Equipment Recommendations  3 in 1 bedside commode    Recommendations for Other Services      Precautions / Restrictions Precautions Precautions: Fall Restrictions Weight Bearing Restrictions: No       Mobility Bed Mobility Overal bed mobility: Needs Assistance Bed Mobility: Supine to Sit;Sit to Supine     Supine to sit: Supervision Sit to supine: Supervision   General bed mobility comments: supervision for safety   Transfers Overall transfer level: Needs assistance Equipment used: Rolling walker (2 wheeled) Transfers: Sit to/from Stand Sit to Stand: Min guard         General transfer comment: min guard for safety and balance    Balance Overall balance assessment: Needs assistance Sitting-balance support: No upper extremity supported;Feet supported Sitting balance-Leahy Scale: Good     Standing balance support: Bilateral upper extremity  supported;During functional activity Standing balance-Leahy Scale: Poor Standing balance comment: Reliant on BUE support                            ADL either performed or assessed with clinical judgement   ADL Overall ADL's : Needs assistance/impaired     Grooming: Min guard;Sitting Grooming Details (indicate cue type and reason): min guard for safety, min cueing to locate paper towels rather than wipe hands on clothes to dry                 Toilet Transfer: Min guard;Ambulation;RW   Toileting- Water quality scientist and Hygiene: Min guard;Sit to/from stand       Functional mobility during ADLs: Min guard;Rolling walker       Vision   Additional Comments: pt reports difficulty with saccadic eye movement to dial phone numbers, cueing to locate paper towels on L side at sink; continue assesment    Perception     Praxis      Cognition Arousal/Alertness: Awake/alert Behavior During Therapy: WFL for tasks assessed/performed Overall Cognitive Status: Impaired/Different from baseline Area of Impairment: Problem solving;Following commands                       Following Commands: Follows multi-step commands inconsistently     Problem Solving: Requires verbal cues;Difficulty sequencing General Comments: improved safety, awareness and ability to follow commands during ADLs, reports increased diffuclty with seqeuncing numbers (when attempting to call her son)         Exercises     Shoulder Instructions       General Comments      Pertinent Vitals/ Pain  Pain Assessment: No/denies pain  Home Living                                          Prior Functioning/Environment              Frequency  Min 2X/week        Progress Toward Goals  OT Goals(current goals can now be found in the care plan section)  Progress towards OT goals: Progressing toward goals  Acute Rehab OT Goals Patient Stated Goal: to get to  rehab Time For Goal Achievement: 03/18/19 Potential to Achieve Goals: Good  Plan Discharge plan remains appropriate;Frequency remains appropriate    Co-evaluation                 AM-PAC OT "6 Clicks" Daily Activity     Outcome Measure   Help from another person eating meals?: None Help from another person taking care of personal grooming?: A Little Help from another person toileting, which includes using toliet, bedpan, or urinal?: A Little Help from another person bathing (including washing, rinsing, drying)?: A Little Help from another person to put on and taking off regular upper body clothing?: A Little Help from another person to put on and taking off regular lower body clothing?: A Little 6 Click Score: 19    End of Session Equipment Utilized During Treatment: Gait belt;Oxygen;Rolling walker  OT Visit Diagnosis: Other abnormalities of gait and mobility (R26.89);Muscle weakness (generalized) (M62.81);Cognitive communication deficit (R41.841);Other symptoms and signs involving the nervous system (R29.898);Other symptoms and signs involving cognitive function;History of falling (Z91.81)   Activity Tolerance Patient tolerated treatment well   Patient Left with call bell/phone within reach;in bed;with bed alarm set   Nurse Communication Mobility status        Time: 1282-0813 OT Time Calculation (min): 25 min  Charges: OT General Charges $OT Visit: 1 Visit OT Treatments $Self Care/Home Management : 8-22 mins $Therapeutic Activity: 23-37 mins  Delight Stare, [place] Pager [phone] Office [phone]    Delight Stare 03/10/2019, 5:00 PM

## 2019-03-10 NOTE — Progress Notes (Signed)
SLP Cancellation Note  Patient Details Name: [name] MRN: [id number] DOB: 1941/04/12   Cancelled treatment:       Reason Eval/Treat Not Completed: Patient at procedure or test/unavailable. Per Gabriel Earing, RN pt is currently at [facility] for radiation simulation. SLP will follow up and re-attempt tx today as able.   Alexia Dinger I. [name], [place], [place] Office number [phone] Pager [phone]  Horton Marshall 03/10/2019, 11:52 AM

## 2019-03-10 NOTE — Progress Notes (Signed)
SLP Cancellation Note  Patient Details Name: TEQUILA ROTTMANN MRN: [id number] DOB: 27-Mar-1941   Cancelled treatment:       Reason Eval/Treat Not Completed: Patient at procedure or test/unavailable(Pt currently with other therapy discipline. SLP will follow up)  Brinlynn Gorton I. [name], [place], [place] Office number [phone] Pager [place] 03/10/2019, 3:03 PM

## 2019-03-10 NOTE — Progress Notes (Signed)
See progress note under physician encounter. 

## 2019-03-10 NOTE — TOC Progression Note (Addendum)
Transition of Care [facility]) - Progression Note    Patient Details  Name: [name] MRN: [id number] Date of Birth: 02-Oct-1941  [facility]) CM/SW [place], LCSW Phone Number: 03/10/2019, 8:38 AM  Clinical Narrative:    Insurance has denied SNF stay and are offering a peer to peer. Will alert MD.   Pasrr: [phone] [id number]    Expected Discharge Plan: [place] Barriers to Discharge: Continued Medical Work up, Ship broker  Expected Discharge Plan and Services Expected Discharge Plan: [place] Choice: [place] arrangements for the past 2 months: Single Family Home                                       Social Determinants of Health (SDOH) Interventions    Readmission Risk Interventions No flowsheet data found.

## 2019-03-11 NOTE — Progress Notes (Signed)
Physical Therapy Treatment Patient Details Name: [name] MRN: [id number] DOB: 11-13-1940 Today's Date: 03/11/2019    History of Present Illness Pt is a 78 y/o female presents with aphasia and R sided weakness. 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 Will likely have surgery to address mets; neurosurgery following. PMH: anxiety, COPD on home oxgyen, depression, dsyphagia, trigeminal neuralgia, aortic aneurysm.      PT Comments    Patient seen for mobility progression. Pt requires supervision/min guard for safety with mobility using RW. Given pt's mobility level, history of falls, and cognitive deficits (pt could use more formal cognitive testing) continue to recommend SNF. However if pt is not eligible for post acute rehab she will need 24 hour supervision/assistance and HHPT. Continue to progress as tolerated.     Follow Up Recommendations  SNF     Equipment Recommendations  None recommended by PT    Recommendations for Other Services       Precautions / Restrictions Precautions Precautions: Fall Restrictions Weight Bearing Restrictions: No    Mobility  Bed Mobility Overal bed mobility: Needs Assistance Bed Mobility: Supine to Sit;Sit to Supine     Supine to sit: Supervision Sit to supine: Supervision   General bed mobility comments: supervision for safety and line management; pt requesting to leave side rail up to sit up EOB and scooted down to end of bed to sit up in between bed rail and end of bed  Transfers Overall transfer level: Needs assistance Equipment used: Rolling walker (2 wheeled) Transfers: Sit to/from Stand Sit to Stand: Min guard         General transfer comment: min guard for safety and cues for safe hand placement  Ambulation/Gait Ambulation/Gait assistance: Min guard Gait Distance (Feet): 50  Feet Assistive device: Rolling walker (2 wheeled) Gait Pattern/deviations: Step-through pattern;Decreased stride length;Trunk flexed Gait velocity: decreased   General Gait Details: cues for safe use of AD especially with turning; assist for line management   Stairs             Wheelchair Mobility    Modified Rankin (Stroke Patients Only)       Balance Overall balance assessment: Needs assistance Sitting-balance support: No upper extremity supported;Feet supported Sitting balance-Leahy Scale: Good     Standing balance support: During functional activity;Single extremity supported;Bilateral upper extremity supported Standing balance-Leahy Scale: Poor                              Cognition Arousal/Alertness: Awake/alert Behavior During Therapy: WFL for tasks assessed/performed Overall Cognitive Status: Impaired/Different from baseline Area of Impairment: Problem solving;Following commands;Safety/judgement                       Following Commands: Follows multi-step commands inconsistently;Follows one step commands with increased time Safety/Judgement: Decreased awareness of safety;Decreased awareness of deficits   Problem Solving: Requires verbal cues;Difficulty sequencing General Comments: pt is not very receptive to education/cues and is easily agitated during session; pt had SCDs on upon arrival and believed she did not have to unhook them to get up and walk and became angry when this therapist disconnected them; pt has difficulty problem solving and line management; could use more formal cognitive testing       Exercises      General Comments  Pertinent Vitals/Pain Pain Assessment: No/denies pain    Home Living                      Prior Function            PT Goals (current goals can now be found in the care plan section) Progress towards PT goals: Progressing toward goals    Frequency    Min 4X/week       PT Plan Current plan remains appropriate    Co-evaluation              AM-PAC PT "6 Clicks" Mobility   Outcome Measure  Help needed turning from your back to your side while in a flat bed without using bedrails?: None Help needed moving from lying on your back to sitting on the side of a flat bed without using bedrails?: A Little Help needed moving to and from a bed to a chair (including a wheelchair)?: A Little Help needed standing up from a chair using your arms (e.g., wheelchair or bedside chair)?: A Little Help needed to walk in hospital room?: A Little Help needed climbing 3-5 steps with a railing? : A Little 6 Click Score: 19    End of Session Equipment Utilized During Treatment: Gait belt;Oxygen Activity Tolerance: Patient tolerated treatment well Patient left: in bed;with call bell/phone within reach;with bed alarm set Nurse Communication: Mobility status PT Visit Diagnosis: Unsteadiness on feet (R26.81);Difficulty in walking, not elsewhere classified (R26.2)     Time: 2376-2831 PT Time Calculation (min) (ACUTE ONLY): 29 min  Charges:  $Gait Training: 8-22 mins $Therapeutic Activity: 8-22 mins                     Earney Navy, PTA Acute Rehabilitation Services Pager: [phone] Office: [phone]     Darliss Cheney 03/11/2019, 9:53 AM

## 2019-03-11 NOTE — Progress Notes (Signed)
STROKE TEAM PROGRESS NOTE     SUBJECTIVE (INTERVAL HISTORY) Pt lying in bed, worked with PT/OT, still recommend SNF.  I had a peer to peer review with Dr. Amalia Hailey regarding patient placement, Dr. Amalia Hailey kindly rescinded denial for SNF.  Will aim for DC to SNF tomorrow.  OBJECTIVE Vitals:   03/11/19 0317 03/11/19 0742 03/11/19 0849 03/11/19 1136  BP: 111/61  (!) 103/54 121/87  Pulse: 70 83 89 82  Resp: 18 18 18 18   Temp: 98 F (36.7 C)  97.9 F (36.6 C) 98.6 F (37 C)  TempSrc: Oral  Oral Oral  SpO2: 95% 93% 97% 94%  Weight:      Height:        CBC:  Recent Labs  Lab 03/09/19 0739 03/10/19 0324  WBC 6.5 8.4  HGB 13.4 13.2  HCT 40.5 39.2  MCV 89.8 89.7  PLT 334 081    Basic Metabolic Panel:  Recent Labs  Lab 03/09/19 0739 03/10/19 0324  NA 134* 137  K 4.2 4.9  CL 96* 99  CO2 29 28  GLUCOSE 95 121*  BUN 11 10  CREATININE 0.57 0.58  CALCIUM 9.8 10.1    Lipid Panel:     Component Value Date/Time   CHOL 220 (H) 03/08/2019 0426   TRIG 57 03/08/2019 0426   HDL 54 03/08/2019 0426   CHOLHDL 4.1 03/08/2019 0426   VLDL 11 03/08/2019 0426   LDLCALC 155 (H) 03/08/2019 0426   HgbA1c:  Lab Results  Component Value Date   HGBA1C 5.1 03/08/2019   Urine Drug Screen:     Component Value Date/Time   LABOPIA NONE DETECTED 03/06/2019 0332   COCAINSCRNUR NONE DETECTED 03/06/2019 0332   LABBENZ POSITIVE (A) 03/06/2019 0332   AMPHETMU NONE DETECTED 03/06/2019 0332   THCU NONE DETECTED 03/06/2019 0332   LABBARB NONE DETECTED 03/06/2019 0332    Alcohol Level     Component Value Date/Time   ETH <10 03/03/2019 1352    IMAGING  Ct Abdomen Pelvis Wo Contrast  Result Date: 03/06/2019 CLINICAL DATA:  Acute onset generalized pain today. EXAM: CT CHEST, ABDOMEN AND PELVIS WITHOUT CONTRAST TECHNIQUE: Multidetector CT imaging of the chest, abdomen and pelvis was performed following the standard protocol without IV contrast. COMPARISON:  CT chest 05/06/2018 and 05/09/2017.  FINDINGS: CT CHEST FINDINGS Cardiovascular: Heart size is normal. No pericardial effusion. Calcific aortic atherosclerosis noted. Mediastinum/Nodes: No enlarged mediastinal, hilar, or axillary lymph nodes. Thyroid gland, trachea, and esophagus demonstrate no significant findings. Lungs/Pleura: Extensive centrilobular emphysematous disease is identified. A 0.4 cm nodule in the superior segment of the right lower lobe on image 65 is unchanged. No new or enlarging pulmonary nodule is identified. There is some dependent airspace disease which is greater on the right. Suture material in the left lower lobe noted. Musculoskeletal: Severe T6 compression fracture and superior endplate compression fracture T8 are unchanged. Pedicle screws and stabilization bars from T10-L2 noted. Severe T12 and L1 compression fractures are unchanged. No new fracture identified. No lytic or sclerotic lesion. CT ABDOMEN PELVIS FINDINGS Hepatobiliary: No focal liver abnormality is seen. Status post cholecystectomy. No biliary dilatation. Pancreas: Unremarkable. No pancreatic ductal dilatation or surrounding inflammatory changes. Spleen: Normal in size without focal abnormality. Adrenals/Urinary Tract: Punctate parenchymal calcification the right kidney is unchanged. The kidneys otherwise appear. Ureters and urinary are unremarkable. Adrenal glands are. Stomach/Bowel: Stomach is within normal limits. Appendix appears normal. No evidence of bowel wall thickening, distention, or inflammatory changes. Scattered diverticulosis noted. Vascular/Lymphatic: Extensive  atherosclerotic vascular disease is present. The descending abdominal aorta measures up to 3.4 cm in diameter and the right common iliac artery measures 1.4 cm. Reproductive: Status post hysterectomy. No adnexal masses. Other: None. Musculoskeletal: There is a mild compression fracture deformity of L3 which appears remote. There is convex right lumbar scoliosis and multilevel degenerative  change. Advanced right hip osteoarthritis is noted. IMPRESSION: Dependent bibasilar airspace disease is greater on the right and likely represents atelectasis but could be secondary to pneumonia. No other finding to suggest acute abnormality is seen in the chest, abdomen or pelvis. Emphysema. Atherosclerosis. 3.4 cm abdominal aortic aneurysm is identified. Recommend followup by ultrasound in 3 years. This recommendation follows ACR consensus guidelines: White Paper of the ACR Incidental Findings Committee II on Vascular Findings. J Am Coll Radiol 2013; 10:789-794. Diverticulosis diverticulitis. Remote thoracic and lumbar spine compression fractures. Electronically Signed   By: Inge Rise M.D.   On: 03/06/2019 17:40   Ct Head Wo Contrast  Result Date: 03/04/2019 CLINICAL DATA:  Intracranial hemorrhage follow-up EXAM: CT HEAD WITHOUT CONTRAST TECHNIQUE: Contiguous axial images were obtained from the base of the skull through the vertex without intravenous contrast. COMPARISON:  Brain MRI from yesterday FINDINGS: Brain: Up to 4.2 Cm (craniocaudal) parenchymal hematoma in the left parietal lobe that is unchanged in extent when compared to brain MRI yesterday. There is a rim of edema that is also stable. Local swelling without midline shift. No hydrocephalus or extra-axial collection. No visible infarct Vascular: Atherosclerotic calcification Skull: No acute or aggressive finding Sinuses/Orbits: Bilateral cataract resection. IMPRESSION: Size stable left parietal hematoma with edema. No new abnormality or midline shift. Electronically Signed   By: Monte Fantasia M.D.   On: 03/04/2019 05:25   Ct Chest Wo Contrast  Result Date: 03/06/2019 CLINICAL DATA:  Acute onset generalized pain today. EXAM: CT CHEST, ABDOMEN AND PELVIS WITHOUT CONTRAST TECHNIQUE: Multidetector CT imaging of the chest, abdomen and pelvis was performed following the standard protocol without IV contrast. COMPARISON:  CT chest 05/06/2018  and 05/09/2017. FINDINGS: CT CHEST FINDINGS Cardiovascular: Heart size is normal. No pericardial effusion. Calcific aortic atherosclerosis noted. Mediastinum/Nodes: No enlarged mediastinal, hilar, or axillary lymph nodes. Thyroid gland, trachea, and esophagus demonstrate no significant findings. Lungs/Pleura: Extensive centrilobular emphysematous disease is identified. A 0.4 cm nodule in the superior segment of the right lower lobe on image 65 is unchanged. No new or enlarging pulmonary nodule is identified. There is some dependent airspace disease which is greater on the right. Suture material in the left lower lobe noted. Musculoskeletal: Severe T6 compression fracture and superior endplate compression fracture T8 are unchanged. Pedicle screws and stabilization bars from T10-L2 noted. Severe T12 and L1 compression fractures are unchanged. No new fracture identified. No lytic or sclerotic lesion. CT ABDOMEN PELVIS FINDINGS Hepatobiliary: No focal liver abnormality is seen. Status post cholecystectomy. No biliary dilatation. Pancreas: Unremarkable. No pancreatic ductal dilatation or surrounding inflammatory changes. Spleen: Normal in size without focal abnormality. Adrenals/Urinary Tract: Punctate parenchymal calcification the right kidney is unchanged. The kidneys otherwise appear. Ureters and urinary are unremarkable. Adrenal glands are. Stomach/Bowel: Stomach is within normal limits. Appendix appears normal. No evidence of bowel wall thickening, distention, or inflammatory changes. Scattered diverticulosis noted. Vascular/Lymphatic: Extensive atherosclerotic vascular disease is present. The descending abdominal aorta measures up to 3.4 cm in diameter and the right common iliac artery measures 1.4 cm. Reproductive: Status post hysterectomy. No adnexal masses. Other: None. Musculoskeletal: There is a mild compression fracture deformity of L3 which appears  remote. There is convex right lumbar scoliosis and  multilevel degenerative change. Advanced right hip osteoarthritis is noted. IMPRESSION: Dependent bibasilar airspace disease is greater on the right and likely represents atelectasis but could be secondary to pneumonia. No other finding to suggest acute abnormality is seen in the chest, abdomen or pelvis. Emphysema. Atherosclerosis. 3.4 cm abdominal aortic aneurysm is identified. Recommend followup by ultrasound in 3 years. This recommendation follows ACR consensus guidelines: White Paper of the ACR Incidental Findings Committee II on Vascular Findings. J Am Coll Radiol 2013; 10:789-794. Diverticulosis diverticulitis. Remote thoracic and lumbar spine compression fractures. Electronically Signed   By: Inge Rise M.D.   On: 03/06/2019 17:40   Mr Virgel Paling SW Contrast  Result Date: 03/04/2019 CLINICAL DATA:  Initial evaluation for acute aphasia, right-sided weakness, confusion. EXAM: MRI HEAD WITHOUT CONTRAST MRA HEAD WITHOUT CONTRAST MRA NECK WITHOUT AND WITH CONTRAST TECHNIQUE: Multiplanar, multiecho pulse sequences of the brain and surrounding structures were obtained without intravenous contrast. Angiographic images of the Circle of Willis were obtained using MRA technique without intravenous contrast. Angiographic images of the neck were obtained using MRA technique without and with intravenous contrast. Carotid stenosis measurements (when applicable) are obtained utilizing NASCET criteria, using the distal internal carotid diameter as the denominator. CONTRAST:  5.7 cc Gadavist. COMPARISON:  Prior CT from earlier the same day. FINDINGS: MRI HEAD FINDINGS Cerebral volume within normal limits for age. Minimal T2/FLAIR hyperintensity noted within the periventricular white matter, nonspecific, felt to be within normal limits for age. Previously identified intraparenchymal hemorrhage centered at the left parietal lobe again seen, measuring 2.9 x 4.0 x 4.3 cm (estimated volume 25 cc). Surrounding vasogenic  edema with mild regional mass effect. No midline shift. No intraventricular or extra-axial extension. No discernible underlying mass lesion or other abnormality. No significant dot burden to suggest cerebral amyloid angiopathy. No findings to suggest overlying trauma. Etiology unclear. No other abnormal foci of restricted diffusion to suggest acute or subacute ischemia. Gray-white matter differentiation otherwise maintained. No encephalomalacia to suggest chronic cortical infarction. No other acute or chronic intracranial hemorrhage. No mass lesion or midline shift. No hydrocephalus. No extra-axial fluid collection. Pituitary gland suprasellar region normal. Midline structures intact. Major intracranial vascular flow voids maintained. Craniocervical junction normal. No focal marrow replacing lesion. Scalp soft tissues unremarkable. Patient status post bilateral ocular lens replacement. Paranasal sinuses are clear. Small left mastoid effusion noted, of doubtful significance. MRA HEAD FINDINGS ANTERIOR CIRCULATION: Examination moderately degraded by motion artifact. Distal cervical segments of the internal carotid arteries are patent with symmetric antegrade flow. Distal cervical left ICA tortuous. Petrous, cavernous, and supraclinoid segments patent without appreciable stenosis. A1 segments patent bilaterally. Anterior cerebral arteries patent to their distal aspects without definite stenosis. Anterior communicating artery not well assessed on this motion degraded exam. No M1 occlusion. Possible short-segment mild distal left M1 stenosis noted (series 1052, image 11). Normal MCA bifurcations. Distal MCA branches well perfused and grossly symmetric. No vascular abnormality seen underlying the partially visualized left parietal hematoma. POSTERIOR CIRCULATION: Vertebral arteries patent to the vertebrobasilar junction without stenosis. Vertebral arteries largely code dominant. Basilar widely patent to its distal aspect.  Superior cerebral arteries patent bilaterally. Both of the posterior cerebral arteries primarily supplied via the basilar and are well perfused to their distal aspects. No definite flow-limiting stenosis. Prominent left posterior communicating artery noted. No visible aneurysm or other vascular abnormality on this motion degraded exam. MRA NECK FINDINGS Examination technically limited by motion artifact. Visualized aortic arch of  normal caliber with normal 3 vessel morphology. No flow-limiting stenosis about the origin of the great vessels. Visualized subclavian arteries widely patent. Right common carotid artery widely patent from its origin to the bifurcation. Mild atheromatous irregularity about the right bifurcation without hemodynamically significant stenosis. Right ICA patent distally to the circle-of-Willis without stenosis or occlusion. Left common carotid artery patent from its origin to the bifurcation without stenosis. No significant atheromatous narrowing about the left bifurcation. Left ICA tortuous but patent to the circle-of-Willis without stenosis or occlusion. Both of the vertebral arteries arise from the subclavian arteries. Vertebral arteries largely code dominant. Probable short-segment moderate stenosis at the origin of the left vertebral artery (series 1084, image 14). Vertebral arteries otherwise widely patent within the neck without stenosis or occlusion. IMPRESSION: MRI HEAD IMPRESSION: 1. Stable size of acute intraparenchymal hemorrhage centered at the left parietal convexity, estimated volume 25 cc. Surrounding vasogenic edema with mild regional mass effect without midline shift. No identifiable etiology identified on this exam. 2. Otherwise normal brain MRI for age. MRA HEAD IMPRESSION: 1. Technically limited exam due to moderate motion degradation. 2. Grossly negative intracranial MRA. No large vessel occlusion, hemodynamically significant stenosis, or other acute vascular process. No  vascular abnormality seen underlying the left parietal hemorrhage. MRA NECK IMPRESSION: 1. Wide patency of the carotid artery systems bilaterally within the neck. 2. Short-segment moderate stenosis at the origin of the left vertebral artery. Vertebral arteries otherwise widely patent within the neck. Vertebral arteries are co dominant. Electronically Signed   By: Jeannine Boga M.D.   On: 03/04/2019 01:21   Mr Jodene Nam Neck W Wo Contrast  Result Date: 03/04/2019 CLINICAL DATA:  Initial evaluation for acute aphasia, right-sided weakness, confusion. EXAM: MRI HEAD WITHOUT CONTRAST MRA HEAD WITHOUT CONTRAST MRA NECK WITHOUT AND WITH CONTRAST TECHNIQUE: Multiplanar, multiecho pulse sequences of the brain and surrounding structures were obtained without intravenous contrast. Angiographic images of the Circle of Willis were obtained using MRA technique without intravenous contrast. Angiographic images of the neck were obtained using MRA technique without and with intravenous contrast. Carotid stenosis measurements (when applicable) are obtained utilizing NASCET criteria, using the distal internal carotid diameter as the denominator. CONTRAST:  5.7 cc Gadavist. COMPARISON:  Prior CT from earlier the same day. FINDINGS: MRI HEAD FINDINGS Cerebral volume within normal limits for age. Minimal T2/FLAIR hyperintensity noted within the periventricular white matter, nonspecific, felt to be within normal limits for age. Previously identified intraparenchymal hemorrhage centered at the left parietal lobe again seen, measuring 2.9 x 4.0 x 4.3 cm (estimated volume 25 cc). Surrounding vasogenic edema with mild regional mass effect. No midline shift. No intraventricular or extra-axial extension. No discernible underlying mass lesion or other abnormality. No significant dot burden to suggest cerebral amyloid angiopathy. No findings to suggest overlying trauma. Etiology unclear. No other abnormal foci of restricted diffusion to  suggest acute or subacute ischemia. Gray-white matter differentiation otherwise maintained. No encephalomalacia to suggest chronic cortical infarction. No other acute or chronic intracranial hemorrhage. No mass lesion or midline shift. No hydrocephalus. No extra-axial fluid collection. Pituitary gland suprasellar region normal. Midline structures intact. Major intracranial vascular flow voids maintained. Craniocervical junction normal. No focal marrow replacing lesion. Scalp soft tissues unremarkable. Patient status post bilateral ocular lens replacement. Paranasal sinuses are clear. Small left mastoid effusion noted, of doubtful significance. MRA HEAD FINDINGS ANTERIOR CIRCULATION: Examination moderately degraded by motion artifact. Distal cervical segments of the internal carotid arteries are patent with symmetric antegrade flow. Distal cervical left ICA  tortuous. Petrous, cavernous, and supraclinoid segments patent without appreciable stenosis. A1 segments patent bilaterally. Anterior cerebral arteries patent to their distal aspects without definite stenosis. Anterior communicating artery not well assessed on this motion degraded exam. No M1 occlusion. Possible short-segment mild distal left M1 stenosis noted (series 1052, image 11). Normal MCA bifurcations. Distal MCA branches well perfused and grossly symmetric. No vascular abnormality seen underlying the partially visualized left parietal hematoma. POSTERIOR CIRCULATION: Vertebral arteries patent to the vertebrobasilar junction without stenosis. Vertebral arteries largely code dominant. Basilar widely patent to its distal aspect. Superior cerebral arteries patent bilaterally. Both of the posterior cerebral arteries primarily supplied via the basilar and are well perfused to their distal aspects. No definite flow-limiting stenosis. Prominent left posterior communicating artery noted. No visible aneurysm or other vascular abnormality on this motion degraded  exam. MRA NECK FINDINGS Examination technically limited by motion artifact. Visualized aortic arch of normal caliber with normal 3 vessel morphology. No flow-limiting stenosis about the origin of the great vessels. Visualized subclavian arteries widely patent. Right common carotid artery widely patent from its origin to the bifurcation. Mild atheromatous irregularity about the right bifurcation without hemodynamically significant stenosis. Right ICA patent distally to the circle-of-Willis without stenosis or occlusion. Left common carotid artery patent from its origin to the bifurcation without stenosis. No significant atheromatous narrowing about the left bifurcation. Left ICA tortuous but patent to the circle-of-Willis without stenosis or occlusion. Both of the vertebral arteries arise from the subclavian arteries. Vertebral arteries largely code dominant. Probable short-segment moderate stenosis at the origin of the left vertebral artery (series 1084, image 14). Vertebral arteries otherwise widely patent within the neck without stenosis or occlusion. IMPRESSION: MRI HEAD IMPRESSION: 1. Stable size of acute intraparenchymal hemorrhage centered at the left parietal convexity, estimated volume 25 cc. Surrounding vasogenic edema with mild regional mass effect without midline shift. No identifiable etiology identified on this exam. 2. Otherwise normal brain MRI for age. MRA HEAD IMPRESSION: 1. Technically limited exam due to moderate motion degradation. 2. Grossly negative intracranial MRA. No large vessel occlusion, hemodynamically significant stenosis, or other acute vascular process. No vascular abnormality seen underlying the left parietal hemorrhage. MRA NECK IMPRESSION: 1. Wide patency of the carotid artery systems bilaterally within the neck. 2. Short-segment moderate stenosis at the origin of the left vertebral artery. Vertebral arteries otherwise widely patent within the neck. Vertebral arteries are co  dominant. Electronically Signed   By: Jeannine Boga M.D.   On: 03/04/2019 01:21   Mr Brain Wo Contrast  Result Date: 03/04/2019 CLINICAL DATA:  Initial evaluation for acute aphasia, right-sided weakness, confusion. EXAM: MRI HEAD WITHOUT CONTRAST MRA HEAD WITHOUT CONTRAST MRA NECK WITHOUT AND WITH CONTRAST TECHNIQUE: Multiplanar, multiecho pulse sequences of the brain and surrounding structures were obtained without intravenous contrast. Angiographic images of the Circle of Willis were obtained using MRA technique without intravenous contrast. Angiographic images of the neck were obtained using MRA technique without and with intravenous contrast. Carotid stenosis measurements (when applicable) are obtained utilizing NASCET criteria, using the distal internal carotid diameter as the denominator. CONTRAST:  5.7 cc Gadavist. COMPARISON:  Prior CT from earlier the same day. FINDINGS: MRI HEAD FINDINGS Cerebral volume within normal limits for age. Minimal T2/FLAIR hyperintensity noted within the periventricular white matter, nonspecific, felt to be within normal limits for age. Previously identified intraparenchymal hemorrhage centered at the left parietal lobe again seen, measuring 2.9 x 4.0 x 4.3 cm (estimated volume 25 cc). Surrounding vasogenic edema with mild regional  mass effect. No midline shift. No intraventricular or extra-axial extension. No discernible underlying mass lesion or other abnormality. No significant dot burden to suggest cerebral amyloid angiopathy. No findings to suggest overlying trauma. Etiology unclear. No other abnormal foci of restricted diffusion to suggest acute or subacute ischemia. Gray-white matter differentiation otherwise maintained. No encephalomalacia to suggest chronic cortical infarction. No other acute or chronic intracranial hemorrhage. No mass lesion or midline shift. No hydrocephalus. No extra-axial fluid collection. Pituitary gland suprasellar region normal. Midline  structures intact. Major intracranial vascular flow voids maintained. Craniocervical junction normal. No focal marrow replacing lesion. Scalp soft tissues unremarkable. Patient status post bilateral ocular lens replacement. Paranasal sinuses are clear. Small left mastoid effusion noted, of doubtful significance. MRA HEAD FINDINGS ANTERIOR CIRCULATION: Examination moderately degraded by motion artifact. Distal cervical segments of the internal carotid arteries are patent with symmetric antegrade flow. Distal cervical left ICA tortuous. Petrous, cavernous, and supraclinoid segments patent without appreciable stenosis. A1 segments patent bilaterally. Anterior cerebral arteries patent to their distal aspects without definite stenosis. Anterior communicating artery not well assessed on this motion degraded exam. No M1 occlusion. Possible short-segment mild distal left M1 stenosis noted (series 1052, image 11). Normal MCA bifurcations. Distal MCA branches well perfused and grossly symmetric. No vascular abnormality seen underlying the partially visualized left parietal hematoma. POSTERIOR CIRCULATION: Vertebral arteries patent to the vertebrobasilar junction without stenosis. Vertebral arteries largely code dominant. Basilar widely patent to its distal aspect. Superior cerebral arteries patent bilaterally. Both of the posterior cerebral arteries primarily supplied via the basilar and are well perfused to their distal aspects. No definite flow-limiting stenosis. Prominent left posterior communicating artery noted. No visible aneurysm or other vascular abnormality on this motion degraded exam. MRA NECK FINDINGS Examination technically limited by motion artifact. Visualized aortic arch of normal caliber with normal 3 vessel morphology. No flow-limiting stenosis about the origin of the great vessels. Visualized subclavian arteries widely patent. Right common carotid artery widely patent from its origin to the bifurcation.  Mild atheromatous irregularity about the right bifurcation without hemodynamically significant stenosis. Right ICA patent distally to the circle-of-Willis without stenosis or occlusion. Left common carotid artery patent from its origin to the bifurcation without stenosis. No significant atheromatous narrowing about the left bifurcation. Left ICA tortuous but patent to the circle-of-Willis without stenosis or occlusion. Both of the vertebral arteries arise from the subclavian arteries. Vertebral arteries largely code dominant. Probable short-segment moderate stenosis at the origin of the left vertebral artery (series 1084, image 14). Vertebral arteries otherwise widely patent within the neck without stenosis or occlusion. IMPRESSION: MRI HEAD IMPRESSION: 1. Stable size of acute intraparenchymal hemorrhage centered at the left parietal convexity, estimated volume 25 cc. Surrounding vasogenic edema with mild regional mass effect without midline shift. No identifiable etiology identified on this exam. 2. Otherwise normal brain MRI for age. MRA HEAD IMPRESSION: 1. Technically limited exam due to moderate motion degradation. 2. Grossly negative intracranial MRA. No large vessel occlusion, hemodynamically significant stenosis, or other acute vascular process. No vascular abnormality seen underlying the left parietal hemorrhage. MRA NECK IMPRESSION: 1. Wide patency of the carotid artery systems bilaterally within the neck. 2. Short-segment moderate stenosis at the origin of the left vertebral artery. Vertebral arteries otherwise widely patent within the neck. Vertebral arteries are co dominant. Electronically Signed   By: Jeannine Boga M.D.   On: 03/04/2019 01:21   Mr Brain W Contrast  Result Date: 03/04/2019 CLINICAL DATA:  Intracranial hemorrhage.  History of lung cancer.  EXAM: MRI HEAD WITH CONTRAST TECHNIQUE: Multiplanar, multiecho pulse sequences of the brain and surrounding structures were obtained with  intravenous contrast. CONTRAST:  6 mL Gadavist COMPARISON:  Noncontrast MRI of the brain 03/03/2019 FINDINGS: Postcontrast T1-weighted imaging of the brain shows multiple intraparenchymal contrast-enhancing lesions. 1. Left cerebellum 5 x 5 mm, 2:40 2. Right temporal lobe 2 mm, 2:51 3. Peripheral contrast enhancement at the site of the known intraparenchymal hematoma in the posterior left parietal lobe, altogether measuring 3.7 x 2.9 cm, 2:87 4. Posterior left insula 5 x 5 mm, 2:91 5. Left frontal precentral gyrus 6 x 6 mm, 2:124 No contrast-enhancing lesions of the calvarium or extracranial soft tissues. IMPRESSION: 1. Five intraparenchymal metastatic lesions, the largest of which is located in the posterior left parietal lobe where the associated hemorrhage makes accurate measurement difficult. 2. Unchanged size of hematoma. Electronically Signed   By: Ulyses Jarred M.D.   On: 03/04/2019 17:57   Mr Jeri Cos IH Contrast  Result Date: 03/09/2019 CLINICAL DATA:  Brain metastases with history of lung cancer. Brain lab and SRS protocol for treatment planning. EXAM: MRI HEAD WITHOUT AND WITH CONTRAST TECHNIQUE: Multiplanar, multiecho pulse sequences of the brain and surrounding structures were obtained without and with intravenous contrast. CONTRAST:  6 mL Gadavist COMPARISON:  Postcontrast brain MRI 03/04/2019 and noncontrast brain MRI 03/03/2019 FINDINGS: Brain: Postcontrast sequences are mildly to moderately motion degraded which reduces sensitivity for detection of small lesions. The hemorrhagic left parieto-occipital mass has not significantly changed in size upon remeasurement, currently measuring 4.1 x 3.2 x 4.6 cm. Mild-to-moderate surrounding edema is stable to minimally increased with mild mass effect on the left lateral ventricle but no significant midline shift. Smaller unchanged lesions measure 3 mm in the left cerebellum (series 11, image 37), 5 mm in the posterior left insula (series 11, image 79), and  6 mm in the posterior left frontal lobe (series 11, image 113). The 2 mm right temporal lobe lesion is not well seen due to motion artifact but is faintly visible on the coronal sequence (series 12, image 27). No new lesions are identified. No acute infarct or extra-axial fluid collection is evident. There is a background of mild chronic small vessel ischemic disease in the cerebral white matter and pons. Mild cerebral atrophy is not greater than expected for age. Vascular: Major intracranial vascular flow voids are preserved. 3 mm focus of enhancement in a left parietal sulcus (series 11, image 84) is associated with a vessel and is most compatible with an aneurysm rather than metastasis. Skull and upper cervical spine: No suspicious marrow lesion. Sinuses/Orbits: Bilateral cataract extraction. Clear paranasal sinuses. Small left mastoid effusion. Other: None. IMPRESSION: 1. Motion degraded postcontrast imaging. 2. 5 intracranial metastases including a hemorrhagic left parieto-occipital lesion with mild-to-moderate edema. 3. 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 Electronically Signed   By: Logan Bores M.D.   On: 03/09/2019 17:02   Ct Head Code Stroke Wo Contrast  Result Date: 03/03/2019 CLINICAL DATA:  Code stroke. Acute onset of aphasia and right-sided weakness. EXAM: CT HEAD WITHOUT CONTRAST TECHNIQUE: Contiguous axial images were obtained from the base of the skull through the vertex without intravenous contrast. COMPARISON:  CT head without contrast 09/11/2016 FINDINGS: Brain: An acute left parietal and occipital hemorrhage measures 3.5 x 3.7 x 3.0 cm. There is surrounding edema with effacement of the adjacent sulci and minimal mass effect on the  posterior horn of the left lateral ventricle. 3 mm midline shift is present. No other focal hemorrhage is present. No other mass lesion or acute infarct is evident. Basal  ganglia are intact. Insular ribbon is normal. No significant extra-axial fluid collection is present. Vascular: Minimal calcifications are present. There is no hyperdense vessel. Skull: Calvarium is intact. No focal lytic or blastic lesions are present. Sinuses/Orbits: The paranasal sinuses and mastoid air cells are clear. Bilateral lens replacements are noted. Globes and orbits are otherwise unremarkable. IMPRESSION: 1. Left parietal and occipital parenchymal hematoma measuring up to 3.7 cm. This is nonspecific. It is most likely related to hypertension or underlying vasculitis in a patient of this age. The differential diagnosis includes metastatic disease or other vascular malformation. No other lesions are evident. 2. Surrounding edema and mass effect is mostly local with minimal midline shift of 3-4 mm. 3. No other focal lesions or hemorrhage. Critical Value/emergent results were called by telephone at the time of interpretation on 03/03/2019 at 1:39 pm to Dr. Gerlene Fee , who verbally acknowledged these results. Electronically Signed   By: San Morelle M.D.   On: 03/03/2019 13:46   Transthoracic Echocardiogram  02/22/2019 normal ejection fraction 60 to 65%.  No wall motion abnormalities.    PHYSICAL EXAM   Temp:  [97.9 F (36.6 C)-98.6 F (37 C)] 98.6 F (37 C) (06/03 1136) Pulse Rate:  [69-90] 82 (06/03 1136) Resp:  [15-18] 18 (06/03 1136) BP: (103-122)/(54-87) 121/87 (06/03 1136) SpO2:  [93 %-97 %] 94 % (06/03 1136)  General - Well nourished, well developed, in no apparent distress.  Ophthalmologic - fundi not visualized due to noncooperation.  Cardiovascular - Regular rate and rhythm.  Mental Status -  Level of arousal and orientation to time, place, and person were intact. Language including expression, naming, repetition, comprehension was assessed and found intact.  Cranial Nerves II - XII - II - Visual field intact OU. III, IV, VI - Extraocular movements intact. V -  Facial sensation intact bilaterally. VII - Facial movement intact bilaterally. VIII - Hearing & vestibular intact bilaterally. X - Palate elevates symmetrically. XI - Chin turning & shoulder shrug intact bilaterally. XII - Tongue protrusion intact.  Motor Strength - The patient's strength was normal in all extremities and pronator drift was absent.  Bulk was normal and fasciculations were absent.   Motor Tone - Muscle tone was assessed at the neck and appendages and was normal.  Reflexes - The patient's reflexes were symmetrical in all extremities and she had no pathological reflexes.  Sensory - Light touch, temperature/pinprick were assessed and were symmetrical.    Coordination - The patient had normal movements in the hands with no ataxia or dysmetria.  Tremor was absent  Gait and Station - deferred.   ASSESSMENT/PLAN Ms. [name] is a 77 y.o. female with history of lung cancer, trigeminal neuralgia, on home O2, IBS, dysphagia, diastolic dysfunction, COPD, aortic aneurysm and anxiety presenting with left parietal/occipital ICH. Possible etiology is HTN, yet given her cancer history MRI with contrast shows multiple enhancing metastatic lesions with hemorrhage into one such lesion as a cause of her intracerebral hemorrhage. Neuro Oncology consult.  ICH - left parietal lobar hematoma - Etiology could be due to underlying metastatic hemorrhage given history of lung cancer and MRI confirmed brain metastases lesions  CT head left parietal and occipital ICH  MRI head -stable IPH ~25cc volume; some surrounding vasogenic edema, no evidence of typical CAA  MRA head -  neg for underlying vascular process  MRI brain with contrast : At least 5 enhancing metastatic lesions with the largest one in the left posterior parietal at the location of the hemorrhage  Repeat MRI brain with and without contrast per [facility] protocol 5 intracranial mets w/ hemorrhagic L parieto-occipital lesion w/ mid to mod  edema. 52mm L parietal sulcus c/w aneurysm.  2D Echo - EF 60 to 65%  [employer] Virus 2 neg  LDL 155  HgbA1c 5.1  VTE prophylaxis - SCDs  No thinners or ASA prior to admit; currently not indicated  Therapy recommendations:  SNF  Disposition:  SNF tomorrow  Brain metastasis  History of lung cancer  MRI w/gad - 5 small/punctate enhancement underlying metastatic lesions.   Repeat MRI brain with and without contrast per [facility] protocol confirm 5 intracranial mets  Neuro Onc consulted with plan for biopsy on Monday, 03/16/2019.   Neurosurgery Kathyrn Sheriff) consulted and plans for stereotactic radiosurgery Tues 6/9 followed by surgical resection of the large L parietal-occipital lesion on 6/10   CT chest/abdomen/pelvis showed right more than left airspace disease, no metastasis  Neurosurgeon ok for d/c and return next week  Hypertension  Stable  BP goal < 160 . 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 Recommend followup by ultrasound in 3 years.   COPD on home oxygen  Trigeminal neuralgia  Hospital day # 8   Rosalin Hawking, MD PhD Stroke Neurology 03/11/2019 3:24 PM  To contact Stroke Continuity provider, please refer to [email or web address] After hours, contact General Neurology

## 2019-03-11 NOTE — Progress Notes (Signed)
Pt to d/c to [facility]. CM called and updated the son: Quillian Quince.

## 2019-03-12 ENCOUNTER — Other Ambulatory Visit: Payer: Self-pay | Admitting: Neurosurgery

## 2019-03-12 DIAGNOSIS — R1310 Dysphagia, unspecified: Secondary | ICD-10-CM | POA: Diagnosis not present

## 2019-03-12 DIAGNOSIS — Z1159 Encounter for screening for other viral diseases: Secondary | ICD-10-CM | POA: Diagnosis not present

## 2019-03-12 DIAGNOSIS — D649 Anemia, unspecified: Secondary | ICD-10-CM | POA: Diagnosis not present

## 2019-03-12 DIAGNOSIS — K277 Chronic peptic ulcer, site unspecified, without hemorrhage or perforation: Secondary | ICD-10-CM | POA: Diagnosis not present

## 2019-03-12 DIAGNOSIS — J449 Chronic obstructive pulmonary disease, unspecified: Secondary | ICD-10-CM | POA: Diagnosis not present

## 2019-03-12 DIAGNOSIS — R52 Pain, unspecified: Secondary | ICD-10-CM | POA: Diagnosis not present

## 2019-03-12 DIAGNOSIS — Z79899 Other long term (current) drug therapy: Secondary | ICD-10-CM | POA: Diagnosis not present

## 2019-03-12 DIAGNOSIS — M810 Age-related osteoporosis without current pathological fracture: Secondary | ICD-10-CM | POA: Diagnosis not present

## 2019-03-12 DIAGNOSIS — E785 Hyperlipidemia, unspecified: Secondary | ICD-10-CM | POA: Diagnosis not present

## 2019-03-12 DIAGNOSIS — D509 Iron deficiency anemia, unspecified: Secondary | ICD-10-CM | POA: Diagnosis not present

## 2019-03-12 DIAGNOSIS — I619 Nontraumatic intracerebral hemorrhage, unspecified: Secondary | ICD-10-CM | POA: Diagnosis not present

## 2019-03-12 DIAGNOSIS — Z90710 Acquired absence of both cervix and uterus: Secondary | ICD-10-CM | POA: Diagnosis not present

## 2019-03-12 DIAGNOSIS — I469 Cardiac arrest, cause unspecified: Secondary | ICD-10-CM | POA: Diagnosis not present

## 2019-03-12 DIAGNOSIS — I10 Essential (primary) hypertension: Secondary | ICD-10-CM | POA: Diagnosis not present

## 2019-03-12 DIAGNOSIS — R58 Hemorrhage, not elsewhere classified: Secondary | ICD-10-CM | POA: Diagnosis not present

## 2019-03-12 DIAGNOSIS — R112 Nausea with vomiting, unspecified: Secondary | ICD-10-CM | POA: Diagnosis not present

## 2019-03-12 DIAGNOSIS — Z79890 Hormone replacement therapy (postmenopausal): Secondary | ICD-10-CM | POA: Diagnosis not present

## 2019-03-12 DIAGNOSIS — J302 Other seasonal allergic rhinitis: Secondary | ICD-10-CM | POA: Diagnosis not present

## 2019-03-12 DIAGNOSIS — R2689 Other abnormalities of gait and mobility: Secondary | ICD-10-CM | POA: Diagnosis not present

## 2019-03-12 DIAGNOSIS — Z9981 Dependence on supplemental oxygen: Secondary | ICD-10-CM | POA: Diagnosis not present

## 2019-03-12 DIAGNOSIS — Z7401 Bed confinement status: Secondary | ICD-10-CM | POA: Diagnosis not present

## 2019-03-12 DIAGNOSIS — C7931 Secondary malignant neoplasm of brain: Secondary | ICD-10-CM | POA: Diagnosis not present

## 2019-03-12 DIAGNOSIS — Z87891 Personal history of nicotine dependence: Secondary | ICD-10-CM | POA: Diagnosis not present

## 2019-03-12 DIAGNOSIS — Z7951 Long term (current) use of inhaled steroids: Secondary | ICD-10-CM | POA: Diagnosis not present

## 2019-03-12 DIAGNOSIS — C3490 Malignant neoplasm of unspecified part of unspecified bronchus or lung: Secondary | ICD-10-CM | POA: Diagnosis not present

## 2019-03-12 DIAGNOSIS — K5900 Constipation, unspecified: Secondary | ICD-10-CM | POA: Diagnosis not present

## 2019-03-12 DIAGNOSIS — G936 Cerebral edema: Secondary | ICD-10-CM | POA: Diagnosis not present

## 2019-03-12 DIAGNOSIS — R5381 Other malaise: Secondary | ICD-10-CM | POA: Diagnosis not present

## 2019-03-12 DIAGNOSIS — R41841 Cognitive communication deficit: Secondary | ICD-10-CM | POA: Diagnosis not present

## 2019-03-12 DIAGNOSIS — M2550 Pain in unspecified joint: Secondary | ICD-10-CM | POA: Diagnosis not present

## 2019-03-12 DIAGNOSIS — I69120 Aphasia following nontraumatic intracerebral hemorrhage: Secondary | ICD-10-CM | POA: Diagnosis not present

## 2019-03-12 DIAGNOSIS — Z411 Encounter for cosmetic surgery: Secondary | ICD-10-CM | POA: Diagnosis not present

## 2019-03-12 DIAGNOSIS — I611 Nontraumatic intracerebral hemorrhage in hemisphere, cortical: Secondary | ICD-10-CM | POA: Diagnosis not present

## 2019-03-12 DIAGNOSIS — Z85118 Personal history of other malignant neoplasm of bronchus and lung: Secondary | ICD-10-CM | POA: Diagnosis not present

## 2019-03-12 DIAGNOSIS — F411 Generalized anxiety disorder: Secondary | ICD-10-CM | POA: Diagnosis not present

## 2019-03-12 DIAGNOSIS — Z111 Encounter for screening for respiratory tuberculosis: Secondary | ICD-10-CM | POA: Diagnosis not present

## 2019-03-12 DIAGNOSIS — E039 Hypothyroidism, unspecified: Secondary | ICD-10-CM | POA: Diagnosis not present

## 2019-03-12 DIAGNOSIS — M6281 Muscle weakness (generalized): Secondary | ICD-10-CM | POA: Diagnosis not present

## 2019-03-12 IMAGING — CT CT CHEST W/O CM
2 of 3 series · 14 of 36 positions shown, 17 images · non-contrast
Comparison: Chest CT 05/27/2016.

CLINICAL DATA: 75-year-old female with new right-sided posterior
chest pain for the past 2 weeks. History of stage IB adenocarcinoma
of the lung status post wedge resection in December 2010. Followup
study.

EXAM:
CT CHEST WITHOUT CONTRAST
TECHNIQUE: Multidetector CT imaging of the chest was performed following the
standard protocol without IV contrast.

[Series 2: thorax · axial · 0.76mm/px · z∈[-308,-58]mm · 11 of 147 slices shown, 14 images]
[im 11/147  mediastinal]
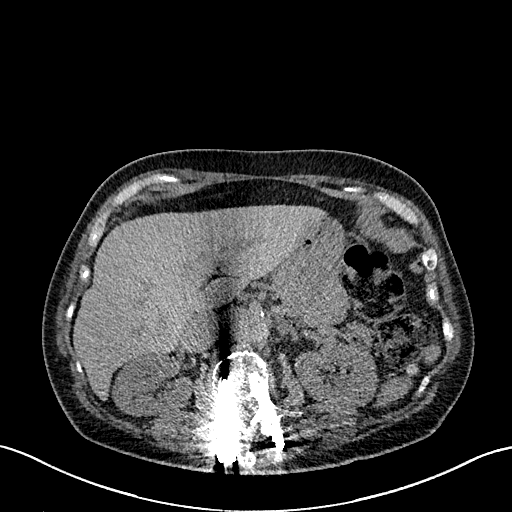
[im 11/147  lung]
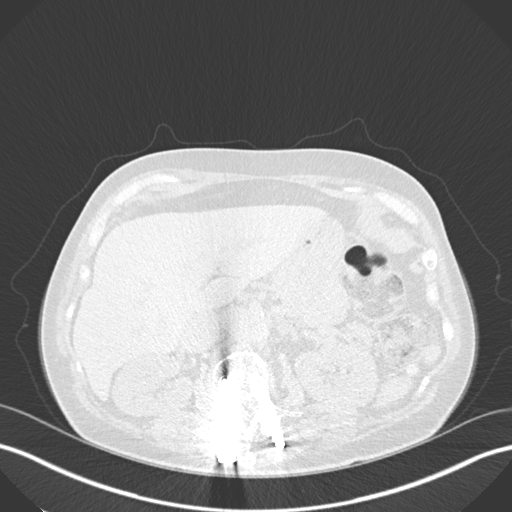
[im 22/147  lung]
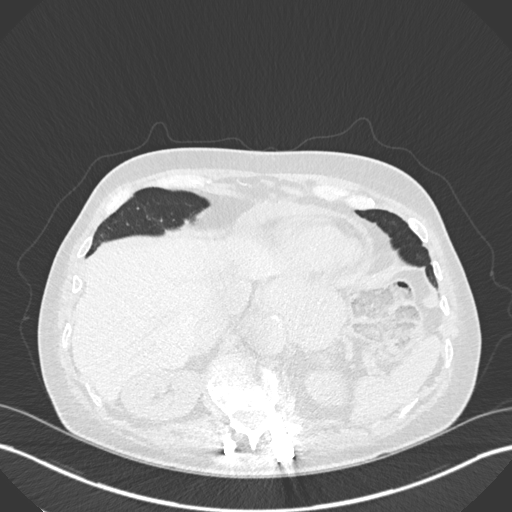
[im 33/147  lung]
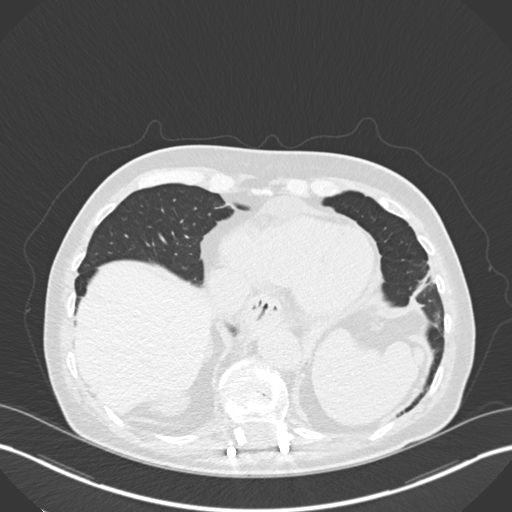
[im 49/147  lung]
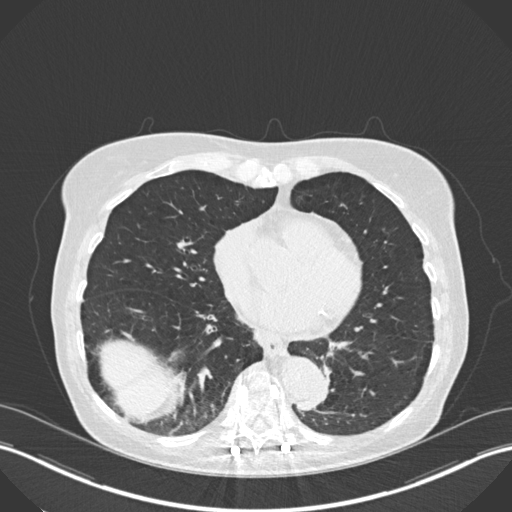
[im 60/147  mediastinal]
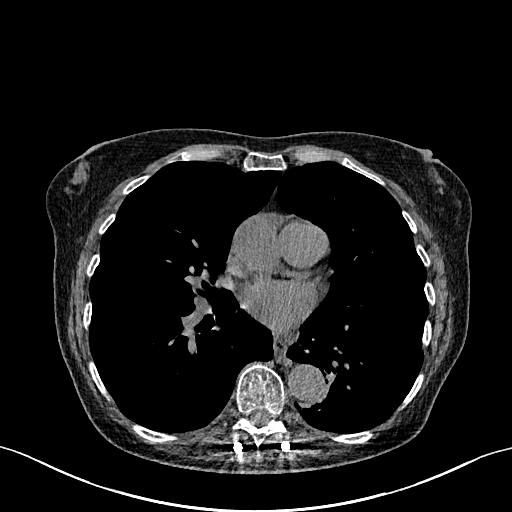
[im 60/147  lung]
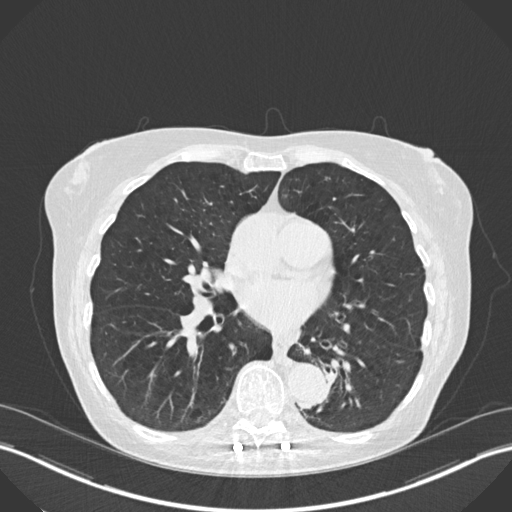
[im 76/147  lung]
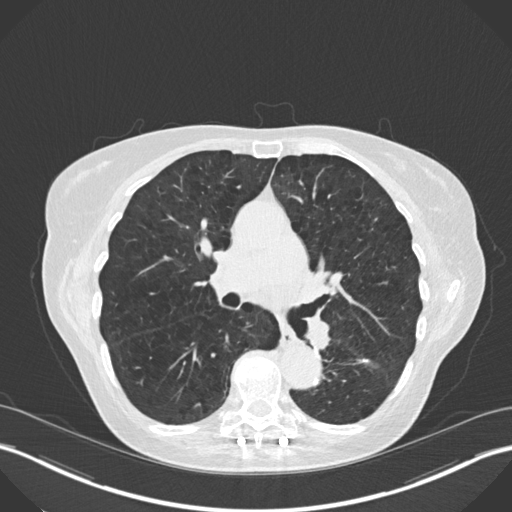
[im 87/147  lung]
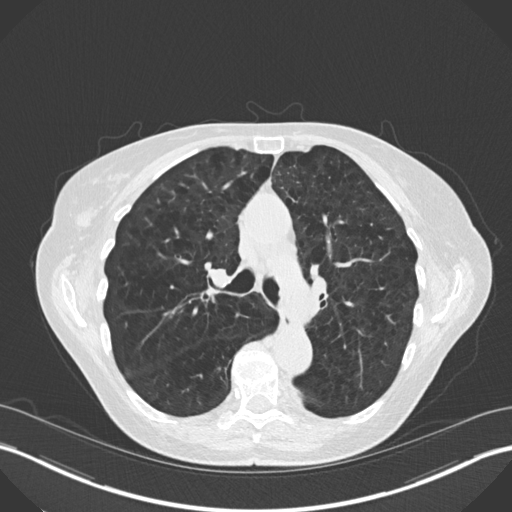
[im 98/147  lung]
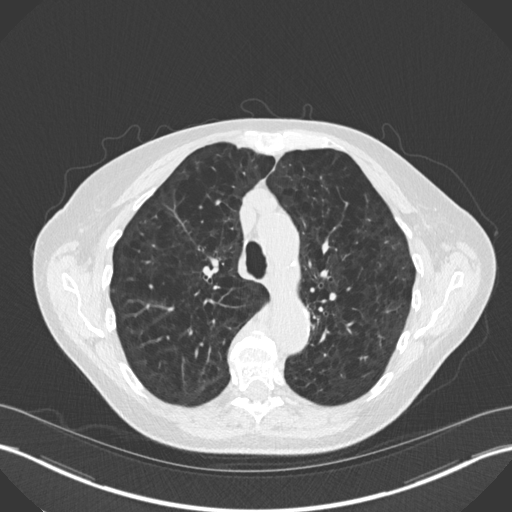
[im 114/147  mediastinal]
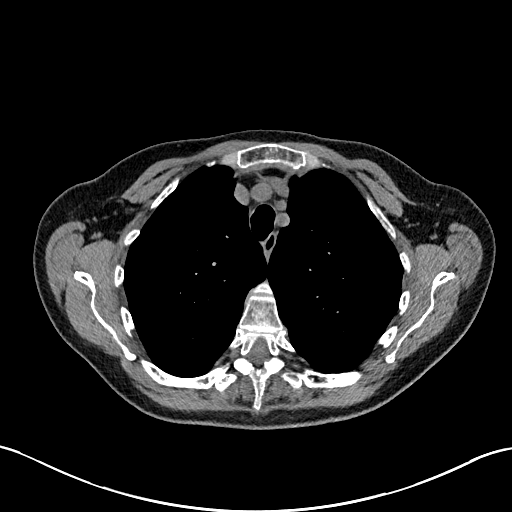
[im 114/147  lung]
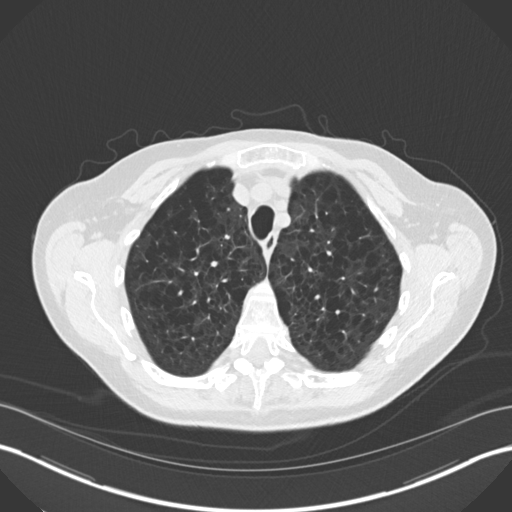
[im 125/147  lung]
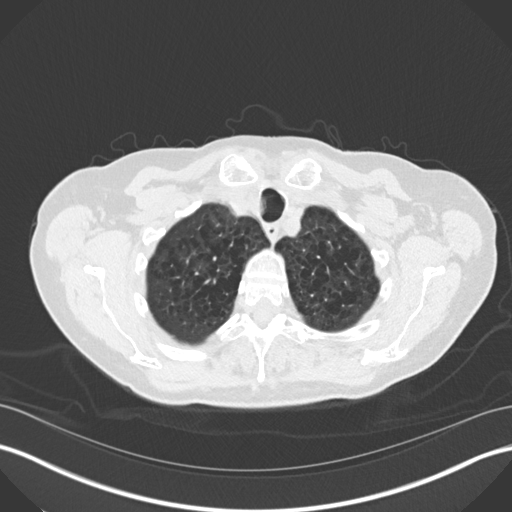
[im 136/147  lung]
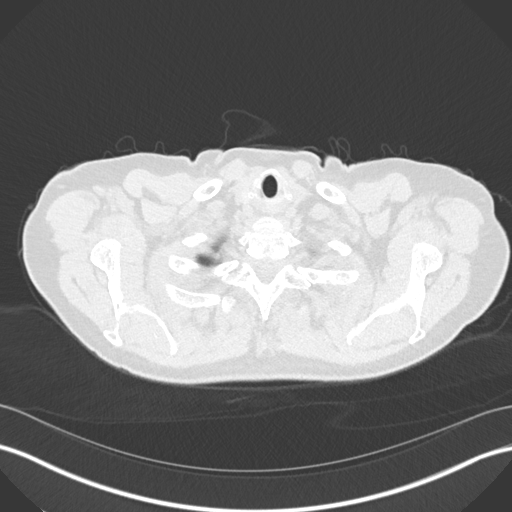

[Series 6: coronal · coronal · 0.59mm/px · 3 of 135 slices shown]
[im 27/135  lung]
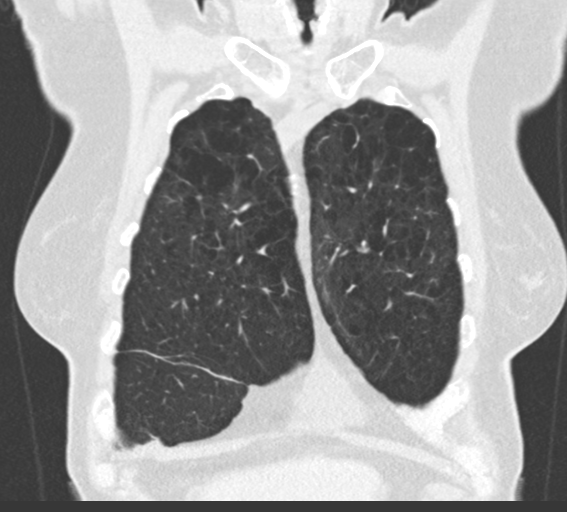
[im 54/135  lung]
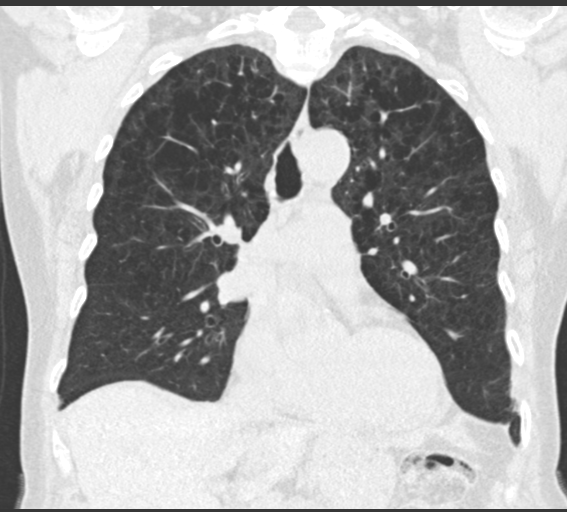
[im 81/135  lung]
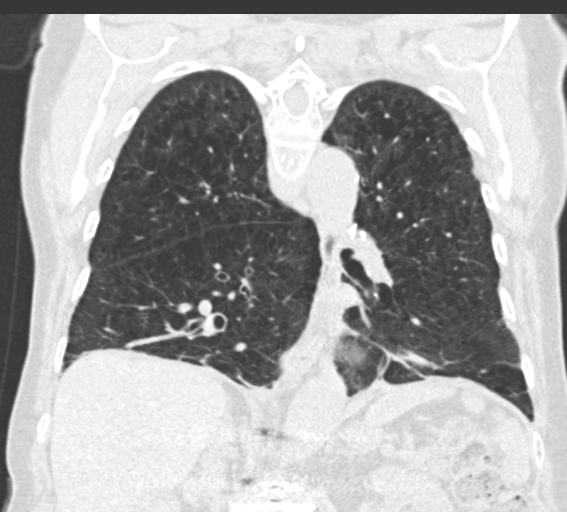

[14 of 36 positions shown; findings below may reference images not displayed]

FINDINGS: Cardiovascular: Heart size is normal. Focal area of pericardial
fluid and/or thickening anterior to the right ventricle, increased
compared to the prior study, now measuring up to 13 mm in thickness.
No associated pericardial calcification. There is aortic
atherosclerosis, as well as atherosclerosis of the great vessels of
the mediastinum and the coronary arteries, including calcified
atherosclerotic plaque in the left main and left circumflex coronary
arteries. Calcifications of the mitral annulus.

Mediastinum/Nodes: No pathologically enlarged mediastinal or hilar
lymph nodes. Please note that accurate exclusion of hilar adenopathy
is limited on noncontrast CT scans. Esophagus is unremarkable in
appearance. No axillary lymphadenopathy.

Lungs/Pleura: Postoperative changes of wedge resection are again
noted in the left lower lobe. Previously noted ground-glass
attenuation nodule in the superior segment of the right lower lobe
has decreased in size compared to the prior examination, currently
measuring 6 x 3 mm (axial image 56 of series 4) previously 8 x 6 mm
on 05/27/2016. No other new suspicious appearing pulmonary nodules
or masses. Diffuse bronchial wall thickening with moderate to severe
centrilobular and paraseptal emphysema. No acute consolidative
airspace disease. No pleural effusions. Linear scarring in the right
middle lobe, inferior segment of the lingula and medial aspect of
the left lower lobe.

Upper Abdomen: 5 mm nonobstructive calculus in the interpolar
collecting system of the right kidney. Aortic atherosclerosis.

Musculoskeletal: Orthopedic fixation hardware in the thoracolumbar
spine again noted an incompletely imaged. Multiple compression
fractures at T6, T8, T12 and L1, which appear unchanged compared to
the prior study, with exception of T6 which is new and associated
with 90% loss of anterior vertebral body height. Although this is
new compared to the prior CT scan, this was evident in retrospect on
prior chest radiographs, and is not an acute finding. There are no
aggressive appearing lytic or blastic lesions noted in the
visualized portions of the skeleton.
IMPRESSION: 1. Status post left lower lobe wedge resection. No findings to
suggest local recurrence of disease or definite metastatic disease
in the thorax. Previously noted ground-glass attenuation nodule in
the superior segment of the right lower lobe appears slightly
smaller than the prior study, suggesting a benign etiology.
2. Diffuse bronchial wall thickening with moderate to severe
centrilobular and paraseptal emphysema; imaging findings suggestive
of underlying COPD.
3. Aortic atherosclerosis, in addition to left main and left
circumflex coronary artery disease. Assessment for potential risk
factor modification, dietary therapy or pharmacologic therapy may be
warranted, if clinically indicated.
4. Increasing anterior pericardial fluid and/or thickening. This is
unlikely to be of hemodynamic significance at this time. No
associated pericardial calcification.
5. New (but non acute) compression fracture of T6 with approximately
90% loss of anterior vertebral body height, in addition to multiple
other chronic compression fractures of the visualize the
thoracolumbar spine.

Aortic Atherosclerosis (SOF5C-GQF.F) and Emphysema (SOF5C-65O.5).

## 2019-03-12 MED ORDER — CITALOPRAM HYDROBROMIDE 40 MG PO TABS: 20.0000 mg | ORAL_TABLET | Freq: Every day | ORAL | Status: DC

## 2019-03-12 MED ORDER — ONDANSETRON 4 MG PO TBDP: 4.0000 mg | ORAL_TABLET | ORAL | Status: DC | PRN

## 2019-03-12 MED ORDER — SENNOSIDES-DOCUSATE SODIUM 8.6-50 MG PO TABS: 1.0000 | ORAL_TABLET | Freq: Every evening | ORAL | Status: DC | PRN

## 2019-03-12 MED ORDER — GUAIFENESIN ER 600 MG PO TB12
600.0000 mg | ORAL_TABLET | Freq: Two times a day (BID) | ORAL | Status: DC
  Administered 2019-03-12: 600 mg via ORAL
  Filled 2019-03-12: qty 1

## 2019-03-12 MED ORDER — ATORVASTATIN CALCIUM 40 MG PO TABS: 40.0000 mg | ORAL_TABLET | Freq: Every day | ORAL | Status: DC

## 2019-03-12 MED ORDER — ALPRAZOLAM 0.25 MG PO TABS: 0.2500 mg | ORAL_TABLET | Freq: Two times a day (BID) | ORAL | 0 refills | Status: DC | PRN

## 2019-03-12 MED ORDER — LEVOTHYROXINE SODIUM 50 MCG PO TABS: 50.0000 ug | ORAL_TABLET | Freq: Every day | ORAL | Status: DC

## 2019-03-12 NOTE — Discharge Summary (Addendum)
Stroke Discharge Summary  Patient ID: [name]   MRN: [id number]      DOB: 07/14/1941  Date of Admission: 03/03/2019 Date of Discharge: 03/12/2019  Attending Physician:  Rosalin Hawking, MD, Stroke MD Consultant(s):     [name] PA for Consuella Lose MD (neurosurgeon), Freeman Caldron PA (radiation onclology) Patient's PCP:  [name], MD  DISCHARGE DIAGNOSIS:  Principal Problem:   ICH (intracerebral hemorrhage) ([place]) - L parietal lobe Active Problems:   Trigeminal neuralgia   Adenocarcinoma of lung ([place])   AAA (abdominal aortic aneurysm) without rupture ([facility])   COPD (chronic obstructive pulmonary disease) ([place])   HLD (hyperlipidemia)   Cytotoxic brain edema ([place])   Brain metastases ([place])   Essential hypertension   Past Medical History:  Diagnosis Date  . Adenocarcinoma of lung ([place]) 12/2010   left lung/surg only  . Allergic rhinitis   . 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 Anxiety disorder   . Aortic aneurysm ([place])   . Complication of anesthesia 2011   bloodpressure dropped during colonoscopy, not problems since  . Compression fracture   . COPD (chronic obstructive pulmonary disease) ([place])   . Depression   . Diastolic dysfunction   . Diverticulosis   . Dysphagia   . Emphysema lung ([place]) 11/08/2014  . Hiatal hernia   . Hypothyroidism   . IBS (irritable bowel syndrome)   . Iron deficiency anemia due to chronic blood loss 12/05/2016  . On home O2 12/15/2013   chronic hypoxia  . PUD (peptic ulcer disease)    90yrs  . Shortness of breath    with exertion  . SIADH (syndrome of inappropriate ADH production) ([place])   . Trigeminal neuralgia    Past Surgical History:  Procedure Laterality Date  . ABDOMINAL HYSTERECTOMY    . BACK SURGERY  January 13, 2014  . CATARACT EXTRACTION    . CATARACT EXTRACTION W/PHACO  09/02/2012   Procedure: CATARACT EXTRACTION PHACO AND INTRAOCULAR LENS PLACEMENT (IOC);  Surgeon: Elta Guadeloupe T.  Gershon Crane, MD;  Location: [facility];  Service: Ophthalmology;  Laterality: Left;  CDE:10.35  . CHOLECYSTECTOMY    . 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 COLONOSCOPY N/A 11/23/2014   Dr. Oneida Alar: moderate diverticulosis, hemorrhoids, redundant colon. next TCS in 10-15 years.   Marland Kitchen ENDARTERECTOMY Right 08/21/2013   Procedure: RIGHT CAROTID ANEURYSM RESECTION;  Surgeon: Rosetta Posner, MD;  Location: [facility];  Service: Vascular;  Laterality: Right;  . ESOPHAGEAL DILATION  02/24/2018   Procedure: ESOPHAGEAL DILATION;  Surgeon: Danie Binder, MD;  [facility];  Service: Endoscopy;;  . ESOPHAGOGASTRODUODENOSCOPY  11/13/2009   w/dilation to 46mm, gastric ulceration (H.Pylori) s/p treatment  . ESOPHAGOGASTRODUODENOSCOPY  11/21/2009   distal esophageal web, gastritis  . ESOPHAGOGASTRODUODENOSCOPY  03/14/12   KDX:IPJASNKNL in the distal esophagus/Mild gastritis/small HH. + H.pylori, prescribed Pylera. Finished treatment.   . ESOPHAGOGASTRODUODENOSCOPY N/A 12/13/2017   Procedure: ESOPHAGOGASTRODUODENOSCOPY (EGD);  Surgeon: Danie Binder, MD;  [facility];  Service: Endoscopy;  Laterality: N/A;  8:30am  . ESOPHAGOGASTRODUODENOSCOPY N/A 02/24/2018   Procedure: ESOPHAGOGASTRODUODENOSCOPY (EGD);  Surgeon: Danie Binder, MD;  [facility];  Service: Endoscopy;  Laterality: N/A;  11:00am  . fatty tumor removal from lt groin    . FRACTURE SURGERY Left    hand and left ring finger  . GIVENS CAPSULE STUDY N/A 12/26/2017  Procedure: GIVENS CAPSULE STUDY;  Surgeon: Danie Binder, MD;  [facility];  Service: Endoscopy;  Laterality: N/A;  7:30am  . GIVENS CAPSULE STUDY N/A 02/24/2018   Procedure: GIVENS CAPSULE STUDY;  Surgeon: Danie Binder, MD;  [facility];  Service: Endoscopy;  Laterality: N/A;  . KNEE SURGERY     patella tendon repair june 2019 [facility]  . LUNG CANCER SURGERY  12/2010   Left VATS, minithoracotomy, LLL  superior segmentectomy  . ORIF PATELLA Right 03/18/2018   Procedure: OPEN REDUCTION INTERNAL (ORIF) FIXATION RIGHT PATELLA;  Surgeon: Carole Civil, MD;  Location: [facility];  Service: Orthopedics;  Laterality: Right;  . ORIF WRIST FRACTURE Left 04/09/2014   Procedure: OPEN REDUCTION INTERNAL FIXATION (ORIF) WRIST FRACTURE;  Surgeon: Renette Butters, MD;  Location: [facility];  Service: Orthopedics;  Laterality: Left;  . PERCUTANEOUS PINNING Left 04/09/2014   Procedure: PERCUTANEOUS PINNING EXTREMITY;  Surgeon: Renette Butters, MD;  Location: [facility];  Service: Orthopedics;  Laterality: Left;  . SAVORY DILATION N/A 12/13/2017   Procedure: SAVORY DILATION;  Surgeon: Danie Binder, MD;  [facility];  Service: Endoscopy;  Laterality: N/A;  . TUBAL LIGATION    . vocal cord surgery  02/06/2011   laryngoscopy with bilateral vocal cord Radiesse injection for vocal cord paralysis  . YAG LASER APPLICATION Left 70/26/3785   Procedure: YAG LASER APPLICATION;  Surgeon: Rutherford Guys, MD;  Location: [facility];  Service: Ophthalmology;  Laterality: Left;    Allergies as of 03/12/2019      Reactions   Ciprofloxacin Hcl Hives   Iohexol Other (See Comments)    Desc: Per [facility], pt is allergic to IV contrast, no type of reaction was available. Pt cannot remember but first reacted around 15 yrs ago from an IVP. Notes were date from 2009 at urology center., [place] Date: [id number]   Prozac [fluoxetine Hcl] Rash   Sulfonamide Derivatives Hives, Rash      Medication List    STOP taking these medications   ibuprofen 200 MG tablet Commonly known as:  ADVIL   magnesium oxide 400 MG tablet Commonly known as:  MAG-OX   omeprazole 20 MG capsule Commonly known as:  PRILOSEC     TAKE these medications   acetaminophen 325 MG tablet Commonly known as:  TYLENOL Take 650 mg by mouth every 6 (six) hours as needed for moderate pain or headache.   ALPRAZolam 0.25 MG tablet Commonly known as:  XANAX Take  1 tablet (0.25 mg total) by mouth 2 (two) times daily as needed for anxiety. What changed:    medication strength  See the new instructions.   atorvastatin 40 MG tablet Commonly known as:  LIPITOR Take 1 tablet (40 mg total) by mouth daily at 6 PM.   citalopram 40 MG tablet Commonly known as:  CELEXA Take 0.5 tablets (20 mg total) by mouth at bedtime. What changed:  See the new instructions.   CITRACAL CALCIUM+D 600-40-500 MG-MG-UNIT Tb24 Generic drug:  Calcium-Magnesium-Vitamin D Take 1 tablet by mouth daily.   denosumab 60 MG/ML Sosy injection Commonly known as:  Prolia Inject 60 mg into the skin every 6 (six) months.   ferrous sulfate 325 (65 FE) MG tablet Take 325 mg by mouth daily with breakfast.   fluticasone 50 MCG/ACT nasal spray Commonly known as:  FLONASE Place 2 sprays into both nostrils daily.   levothyroxine 50 MCG tablet Commonly known as:  SYNTHROID Take 1 tablet (  50 mcg total) by mouth daily before breakfast. What changed:  See the new instructions.   multivitamin with minerals Tabs tablet Take 1 tablet by mouth daily.   ondansetron 4 MG disintegrating tablet Commonly known as:  Zofran ODT Take 1 tablet (4 mg total) by mouth every 4 (four) hours as needed for nausea or vomiting.   pantoprazole 40 MG tablet Commonly known as:  PROTONIX Take 1 tablet (40 mg total) by mouth daily before breakfast.   senna-docusate 8.6-50 MG tablet Commonly known as:  Senokot-S Take 1 tablet by mouth at bedtime as needed for mild constipation.   umeclidinium-vilanterol 62.5-25 MCG/INH Aepb Commonly known as:  Anoro Ellipta Inhale 1 puff into the lungs daily.       LABORATORY STUDIES CBC    Component Value Date/Time   WBC 8.4 03/10/2019 0324   RBC 4.37 03/10/2019 0324   HGB 13.2 03/10/2019 0324   HCT 39.2 03/10/2019 0324   PLT 334 03/10/2019 0324   MCV 89.7 03/10/2019 0324   MCH 30.2 03/10/2019 0324   MCHC 33.7 03/10/2019 0324   RDW 12.3 03/10/2019 0324    LYMPHSABS 0.6 (L) 03/03/2019 1352   MONOABS 0.3 03/03/2019 1352   EOSABS 0.0 03/03/2019 1352   BASOSABS 0.1 03/03/2019 1352   CMP    Component Value Date/Time   NA 137 03/10/2019 0324   K 4.9 03/10/2019 0324   CL 99 03/10/2019 0324   CO2 28 03/10/2019 0324   GLUCOSE 121 (H) 03/10/2019 0324   BUN 10 03/10/2019 0324   CREATININE 0.58 03/10/2019 0324   CREATININE 0.40 (L) 11/01/2016 1056   CALCIUM 10.1 03/10/2019 0324   PROT 7.2 03/03/2019 1352   ALBUMIN 4.6 03/03/2019 1352   AST 19 03/03/2019 1352   ALT 14 03/03/2019 1352   ALKPHOS 48 03/03/2019 1352   BILITOT 0.6 03/03/2019 1352   GFRNONAA >60 03/10/2019 0324   GFRNONAA >89 11/01/2016 1056   GFRAA >60 03/10/2019 0324   GFRAA >89 11/01/2016 1056   COAGS Lab Results  Component Value Date   INR 0.9 03/03/2019   INR 0.93 08/19/2013   INR 0.95 12/19/2010   Lipid Panel    Component Value Date/Time   CHOL 220 (H) 03/08/2019 0426   TRIG 57 03/08/2019 0426   HDL 54 03/08/2019 0426   CHOLHDL 4.1 03/08/2019 0426   VLDL 11 03/08/2019 0426   LDLCALC 155 (H) 03/08/2019 0426   HgbA1C  Lab Results  Component Value Date   HGBA1C 5.1 03/08/2019   Urinalysis    Component Value Date/Time   COLORURINE YELLOW 03/06/2019 0332   APPEARANCEUR CLEAR 03/06/2019 0332   LABSPEC 1.012 03/06/2019 0332   PHURINE 6.0 03/06/2019 0332   GLUCOSEU NEGATIVE 03/06/2019 0332   HGBUR NEGATIVE 03/06/2019 0332   BILIRUBINUR NEGATIVE 03/06/2019 0332   KETONESUR NEGATIVE 03/06/2019 0332   PROTEINUR NEGATIVE 03/06/2019 0332   UROBILINOGEN 0.2 06/01/2015 1145   NITRITE NEGATIVE 03/06/2019 0332   LEUKOCYTESUR NEGATIVE 03/06/2019 0332   Urine Drug Screen     Component Value Date/Time   LABOPIA NONE DETECTED 03/06/2019 0332   COCAINSCRNUR NONE DETECTED 03/06/2019 0332   LABBENZ POSITIVE (A) 03/06/2019 0332   AMPHETMU NONE DETECTED 03/06/2019 0332   THCU NONE DETECTED 03/06/2019 0332   LABBARB NONE DETECTED 03/06/2019 0332    Alcohol  Level    Component Value Date/Time   ETH <10 03/03/2019 1352     SIGNIFICANT DIAGNOSTIC STUDIES Ct Abdomen Pelvis Wo Contrast  Result Date: 03/06/2019 CLINICAL  DATA:  Acute onset generalized pain today. EXAM: CT CHEST, ABDOMEN AND PELVIS WITHOUT CONTRAST TECHNIQUE: Multidetector CT imaging of the chest, abdomen and pelvis was performed following the standard protocol without IV contrast. COMPARISON:  CT chest 05/06/2018 and 05/09/2017. FINDINGS: CT CHEST FINDINGS Cardiovascular: Heart size is normal. No pericardial effusion. Calcific aortic atherosclerosis noted. Mediastinum/Nodes: No enlarged mediastinal, hilar, or axillary lymph nodes. Thyroid gland, trachea, and esophagus demonstrate no significant findings. Lungs/Pleura: Extensive centrilobular emphysematous disease is identified. A 0.4 cm nodule in the superior segment of the right lower lobe on image 65 is unchanged. No new or enlarging pulmonary nodule is identified. There is some dependent airspace disease which is greater on the right. Suture material in the left lower lobe noted. Musculoskeletal: Severe T6 compression fracture and superior endplate compression fracture T8 are unchanged. Pedicle screws and stabilization bars from T10-L2 noted. Severe T12 and L1 compression fractures are unchanged. No new fracture identified. No lytic or sclerotic lesion. CT ABDOMEN PELVIS FINDINGS Hepatobiliary: No focal liver abnormality is seen. Status post cholecystectomy. No biliary dilatation. Pancreas: Unremarkable. No pancreatic ductal dilatation or surrounding inflammatory changes. Spleen: Normal in size without focal abnormality. Adrenals/Urinary Tract: Punctate parenchymal calcification the right kidney is unchanged. The kidneys otherwise appear. Ureters and urinary are unremarkable. Adrenal glands are. Stomach/Bowel: Stomach is within normal limits. Appendix appears normal. No evidence of bowel wall thickening, distention, or inflammatory changes.  Scattered diverticulosis noted. Vascular/Lymphatic: Extensive atherosclerotic vascular disease is present. The descending abdominal aorta measures up to 3.4 cm in diameter and the right common iliac artery measures 1.4 cm. Reproductive: Status post hysterectomy. No adnexal masses. Other: None. Musculoskeletal: There is a mild compression fracture deformity of L3 which appears remote. There is convex right lumbar scoliosis and multilevel degenerative change. Advanced right hip osteoarthritis is noted. IMPRESSION: Dependent bibasilar airspace disease is greater on the right and likely represents atelectasis but could be secondary to pneumonia. No other finding to suggest acute abnormality is seen in the chest, abdomen or pelvis. Emphysema. Atherosclerosis. 3.4 cm abdominal aortic aneurysm is identified. Recommend followup by ultrasound in 3 years. This recommendation follows ACR consensus guidelines: White Paper of the ACR Incidental Findings Committee II on Vascular Findings. J Am Coll Radiol 2013; 10:789-794. Diverticulosis diverticulitis. Remote thoracic and lumbar spine compression fractures. Electronically Signed   By: Inge Rise M.D.   On: 03/06/2019 17:40   Ct Head Wo Contrast  Result Date: 03/04/2019 CLINICAL DATA:  Intracranial hemorrhage follow-up EXAM: CT HEAD WITHOUT CONTRAST TECHNIQUE: Contiguous axial images were obtained from the base of the skull through the vertex without intravenous contrast. COMPARISON:  Brain MRI from yesterday FINDINGS: Brain: Up to 4.2 Cm (craniocaudal) parenchymal hematoma in the left parietal lobe that is unchanged in extent when compared to brain MRI yesterday. There is a rim of edema that is also stable. Local swelling without midline shift. No hydrocephalus or extra-axial collection. No visible infarct Vascular: Atherosclerotic calcification Skull: No acute or aggressive finding Sinuses/Orbits: Bilateral cataract resection. IMPRESSION: Size stable left parietal  hematoma with edema. No new abnormality or midline shift. Electronically Signed   By: Monte Fantasia M.D.   On: 03/04/2019 05:25   Ct Chest Wo Contrast  Result Date: 03/06/2019 CLINICAL DATA:  Acute onset generalized pain today. EXAM: CT CHEST, ABDOMEN AND PELVIS WITHOUT CONTRAST TECHNIQUE: Multidetector CT imaging of the chest, abdomen and pelvis was performed following the standard protocol without IV contrast. COMPARISON:  CT chest 05/06/2018 and 05/09/2017. FINDINGS: CT CHEST FINDINGS Cardiovascular:  Heart size is normal. No pericardial effusion. Calcific aortic atherosclerosis noted. Mediastinum/Nodes: No enlarged mediastinal, hilar, or axillary lymph nodes. Thyroid gland, trachea, and esophagus demonstrate no significant findings. Lungs/Pleura: Extensive centrilobular emphysematous disease is identified. A 0.4 cm nodule in the superior segment of the right lower lobe on image 65 is unchanged. No new or enlarging pulmonary nodule is identified. There is some dependent airspace disease which is greater on the right. Suture material in the left lower lobe noted. Musculoskeletal: Severe T6 compression fracture and superior endplate compression fracture T8 are unchanged. Pedicle screws and stabilization bars from T10-L2 noted. Severe T12 and L1 compression fractures are unchanged. No new fracture identified. No lytic or sclerotic lesion. CT ABDOMEN PELVIS FINDINGS Hepatobiliary: No focal liver abnormality is seen. Status post cholecystectomy. No biliary dilatation. Pancreas: Unremarkable. No pancreatic ductal dilatation or surrounding inflammatory changes. Spleen: Normal in size without focal abnormality. Adrenals/Urinary Tract: Punctate parenchymal calcification the right kidney is unchanged. The kidneys otherwise appear. Ureters and urinary are unremarkable. Adrenal glands are. Stomach/Bowel: Stomach is within normal limits. Appendix appears normal. No evidence of bowel wall thickening, distention, or  inflammatory changes. Scattered diverticulosis noted. Vascular/Lymphatic: Extensive atherosclerotic vascular disease is present. The descending abdominal aorta measures up to 3.4 cm in diameter and the right common iliac artery measures 1.4 cm. Reproductive: Status post hysterectomy. No adnexal masses. Other: None. Musculoskeletal: There is a mild compression fracture deformity of L3 which appears remote. There is convex right lumbar scoliosis and multilevel degenerative change. Advanced right hip osteoarthritis is noted. IMPRESSION: Dependent bibasilar airspace disease is greater on the right and likely represents atelectasis but could be secondary to pneumonia. No other finding to suggest acute abnormality is seen in the chest, abdomen or pelvis. Emphysema. Atherosclerosis. 3.4 cm abdominal aortic aneurysm is identified. Recommend followup by ultrasound in 3 years. This recommendation follows ACR consensus guidelines: White Paper of the ACR Incidental Findings Committee II on Vascular Findings. J Am Coll Radiol 2013; 10:789-794. Diverticulosis diverticulitis. Remote thoracic and lumbar spine compression fractures. Electronically Signed   By: Inge Rise M.D.   On: 03/06/2019 17:40   Mr Virgel Paling JX Contrast  Result Date: 03/04/2019 CLINICAL DATA:  Initial evaluation for acute aphasia, right-sided weakness, confusion. EXAM: MRI HEAD WITHOUT CONTRAST MRA HEAD WITHOUT CONTRAST MRA NECK WITHOUT AND WITH CONTRAST TECHNIQUE: Multiplanar, multiecho pulse sequences of the brain and surrounding structures were obtained without intravenous contrast. Angiographic images of the Circle of Willis were obtained using MRA technique without intravenous contrast. Angiographic images of the neck were obtained using MRA technique without and with intravenous contrast. Carotid stenosis measurements (when applicable) are obtained utilizing NASCET criteria, using the distal internal carotid diameter as the denominator. CONTRAST:   5.7 cc Gadavist. COMPARISON:  Prior CT from earlier the same day. FINDINGS: MRI HEAD FINDINGS Cerebral volume within normal limits for age. Minimal T2/FLAIR hyperintensity noted within the periventricular white matter, nonspecific, felt to be within normal limits for age. Previously identified intraparenchymal hemorrhage centered at the left parietal lobe again seen, measuring 2.9 x 4.0 x 4.3 cm (estimated volume 25 cc). Surrounding vasogenic edema with mild regional mass effect. No midline shift. No intraventricular or extra-axial extension. No discernible underlying mass lesion or other abnormality. No significant dot burden to suggest cerebral amyloid angiopathy. No findings to suggest overlying trauma. Etiology unclear. No other abnormal foci of restricted diffusion to suggest acute or subacute ischemia. Gray-white matter differentiation otherwise maintained. No encephalomalacia to suggest chronic cortical infarction. No other acute  or chronic intracranial hemorrhage. No mass lesion or midline shift. No hydrocephalus. No extra-axial fluid collection. Pituitary gland suprasellar region normal. Midline structures intact. Major intracranial vascular flow voids maintained. Craniocervical junction normal. No focal marrow replacing lesion. Scalp soft tissues unremarkable. Patient status post bilateral ocular lens replacement. Paranasal sinuses are clear. Small left mastoid effusion noted, of doubtful significance. MRA HEAD FINDINGS ANTERIOR CIRCULATION: Examination moderately degraded by motion artifact. Distal cervical segments of the internal carotid arteries are patent with symmetric antegrade flow. Distal cervical left ICA tortuous. Petrous, cavernous, and supraclinoid segments patent without appreciable stenosis. A1 segments patent bilaterally. Anterior cerebral arteries patent to their distal aspects without definite stenosis. Anterior communicating artery not well assessed on this motion degraded exam. No M1  occlusion. Possible short-segment mild distal left M1 stenosis noted (series 1052, image 11). Normal MCA bifurcations. Distal MCA branches well perfused and grossly symmetric. No vascular abnormality seen underlying the partially visualized left parietal hematoma. POSTERIOR CIRCULATION: Vertebral arteries patent to the vertebrobasilar junction without stenosis. Vertebral arteries largely code dominant. Basilar widely patent to its distal aspect. Superior cerebral arteries patent bilaterally. Both of the posterior cerebral arteries primarily supplied via the basilar and are well perfused to their distal aspects. No definite flow-limiting stenosis. Prominent left posterior communicating artery noted. No visible aneurysm or other vascular abnormality on this motion degraded exam. MRA NECK FINDINGS Examination technically limited by motion artifact. Visualized aortic arch of normal caliber with normal 3 vessel morphology. No flow-limiting stenosis about the origin of the great vessels. Visualized subclavian arteries widely patent. Right common carotid artery widely patent from its origin to the bifurcation. Mild atheromatous irregularity about the right bifurcation without hemodynamically significant stenosis. Right ICA patent distally to the circle-of-Willis without stenosis or occlusion. Left common carotid artery patent from its origin to the bifurcation without stenosis. No significant atheromatous narrowing about the left bifurcation. Left ICA tortuous but patent to the circle-of-Willis without stenosis or occlusion. Both of the vertebral arteries arise from the subclavian arteries. Vertebral arteries largely code dominant. Probable short-segment moderate stenosis at the origin of the left vertebral artery (series 1084, image 14). Vertebral arteries otherwise widely patent within the neck without stenosis or occlusion. IMPRESSION: MRI HEAD IMPRESSION: 1. Stable size of acute intraparenchymal hemorrhage centered at  the left parietal convexity, estimated volume 25 cc. Surrounding vasogenic edema with mild regional mass effect without midline shift. No identifiable etiology identified on this exam. 2. Otherwise normal brain MRI for age. MRA HEAD IMPRESSION: 1. Technically limited exam due to moderate motion degradation. 2. Grossly negative intracranial MRA. No large vessel occlusion, hemodynamically significant stenosis, or other acute vascular process. No vascular abnormality seen underlying the left parietal hemorrhage. MRA NECK IMPRESSION: 1. Wide patency of the carotid artery systems bilaterally within the neck. 2. Short-segment moderate stenosis at the origin of the left vertebral artery. Vertebral arteries otherwise widely patent within the neck. Vertebral arteries are co dominant. Electronically Signed   By: Jeannine Boga M.D.   On: 03/04/2019 01:21   Mr Jodene Nam Neck W Wo Contrast  Result Date: 03/04/2019 CLINICAL DATA:  Initial evaluation for acute aphasia, right-sided weakness, confusion. EXAM: MRI HEAD WITHOUT CONTRAST MRA HEAD WITHOUT CONTRAST MRA NECK WITHOUT AND WITH CONTRAST TECHNIQUE: Multiplanar, multiecho pulse sequences of the brain and surrounding structures were obtained without intravenous contrast. Angiographic images of the Circle of Willis were obtained using MRA technique without intravenous contrast. Angiographic images of the neck were obtained using MRA technique without and with intravenous  contrast. Carotid stenosis measurements (when applicable) are obtained utilizing NASCET criteria, using the distal internal carotid diameter as the denominator. CONTRAST:  5.7 cc Gadavist. COMPARISON:  Prior CT from earlier the same day. FINDINGS: MRI HEAD FINDINGS Cerebral volume within normal limits for age. Minimal T2/FLAIR hyperintensity noted within the periventricular white matter, nonspecific, felt to be within normal limits for age. Previously identified intraparenchymal hemorrhage centered at the  left parietal lobe again seen, measuring 2.9 x 4.0 x 4.3 cm (estimated volume 25 cc). Surrounding vasogenic edema with mild regional mass effect. No midline shift. No intraventricular or extra-axial extension. No discernible underlying mass lesion or other abnormality. No significant dot burden to suggest cerebral amyloid angiopathy. No findings to suggest overlying trauma. Etiology unclear. No other abnormal foci of restricted diffusion to suggest acute or subacute ischemia. Gray-white matter differentiation otherwise maintained. No encephalomalacia to suggest chronic cortical infarction. No other acute or chronic intracranial hemorrhage. No mass lesion or midline shift. No hydrocephalus. No extra-axial fluid collection. Pituitary gland suprasellar region normal. Midline structures intact. Major intracranial vascular flow voids maintained. Craniocervical junction normal. No focal marrow replacing lesion. Scalp soft tissues unremarkable. Patient status post bilateral ocular lens replacement. Paranasal sinuses are clear. Small left mastoid effusion noted, of doubtful significance. MRA HEAD FINDINGS ANTERIOR CIRCULATION: Examination moderately degraded by motion artifact. Distal cervical segments of the internal carotid arteries are patent with symmetric antegrade flow. Distal cervical left ICA tortuous. Petrous, cavernous, and supraclinoid segments patent without appreciable stenosis. A1 segments patent bilaterally. Anterior cerebral arteries patent to their distal aspects without definite stenosis. Anterior communicating artery not well assessed on this motion degraded exam. No M1 occlusion. Possible short-segment mild distal left M1 stenosis noted (series 1052, image 11). Normal MCA bifurcations. Distal MCA branches well perfused and grossly symmetric. No vascular abnormality seen underlying the partially visualized left parietal hematoma. POSTERIOR CIRCULATION: Vertebral arteries patent to the vertebrobasilar  junction without stenosis. Vertebral arteries largely code dominant. Basilar widely patent to its distal aspect. Superior cerebral arteries patent bilaterally. Both of the posterior cerebral arteries primarily supplied via the basilar and are well perfused to their distal aspects. No definite flow-limiting stenosis. Prominent left posterior communicating artery noted. No visible aneurysm or other vascular abnormality on this motion degraded exam. MRA NECK FINDINGS Examination technically limited by motion artifact. Visualized aortic arch of normal caliber with normal 3 vessel morphology. No flow-limiting stenosis about the origin of the great vessels. Visualized subclavian arteries widely patent. Right common carotid artery widely patent from its origin to the bifurcation. Mild atheromatous irregularity about the right bifurcation without hemodynamically significant stenosis. Right ICA patent distally to the circle-of-Willis without stenosis or occlusion. Left common carotid artery patent from its origin to the bifurcation without stenosis. No significant atheromatous narrowing about the left bifurcation. Left ICA tortuous but patent to the circle-of-Willis without stenosis or occlusion. Both of the vertebral arteries arise from the subclavian arteries. Vertebral arteries largely code dominant. Probable short-segment moderate stenosis at the origin of the left vertebral artery (series 1084, image 14). Vertebral arteries otherwise widely patent within the neck without stenosis or occlusion. IMPRESSION: MRI HEAD IMPRESSION: 1. Stable size of acute intraparenchymal hemorrhage centered at the left parietal convexity, estimated volume 25 cc. Surrounding vasogenic edema with mild regional mass effect without midline shift. No identifiable etiology identified on this exam. 2. Otherwise normal brain MRI for age. MRA HEAD IMPRESSION: 1. Technically limited exam due to moderate motion degradation. 2. Grossly negative  intracranial MRA. No large  vessel occlusion, hemodynamically significant stenosis, or other acute vascular process. No vascular abnormality seen underlying the left parietal hemorrhage. MRA NECK IMPRESSION: 1. Wide patency of the carotid artery systems bilaterally within the neck. 2. Short-segment moderate stenosis at the origin of the left vertebral artery. Vertebral arteries otherwise widely patent within the neck. Vertebral arteries are co dominant. Electronically Signed   By: Jeannine Boga M.D.   On: 03/04/2019 01:21   Mr Brain Wo Contrast  Result Date: 03/04/2019 CLINICAL DATA:  Initial evaluation for acute aphasia, right-sided weakness, confusion. EXAM: MRI HEAD WITHOUT CONTRAST MRA HEAD WITHOUT CONTRAST MRA NECK WITHOUT AND WITH CONTRAST TECHNIQUE: Multiplanar, multiecho pulse sequences of the brain and surrounding structures were obtained without intravenous contrast. Angiographic images of the Circle of Willis were obtained using MRA technique without intravenous contrast. Angiographic images of the neck were obtained using MRA technique without and with intravenous contrast. Carotid stenosis measurements (when applicable) are obtained utilizing NASCET criteria, using the distal internal carotid diameter as the denominator. CONTRAST:  5.7 cc Gadavist. COMPARISON:  Prior CT from earlier the same day. FINDINGS: MRI HEAD FINDINGS Cerebral volume within normal limits for age. Minimal T2/FLAIR hyperintensity noted within the periventricular white matter, nonspecific, felt to be within normal limits for age. Previously identified intraparenchymal hemorrhage centered at the left parietal lobe again seen, measuring 2.9 x 4.0 x 4.3 cm (estimated volume 25 cc). Surrounding vasogenic edema with mild regional mass effect. No midline shift. No intraventricular or extra-axial extension. No discernible underlying mass lesion or other abnormality. No significant dot burden to suggest cerebral amyloid angiopathy.  No findings to suggest overlying trauma. Etiology unclear. No other abnormal foci of restricted diffusion to suggest acute or subacute ischemia. Gray-white matter differentiation otherwise maintained. No encephalomalacia to suggest chronic cortical infarction. No other acute or chronic intracranial hemorrhage. No mass lesion or midline shift. No hydrocephalus. No extra-axial fluid collection. Pituitary gland suprasellar region normal. Midline structures intact. Major intracranial vascular flow voids maintained. Craniocervical junction normal. No focal marrow replacing lesion. Scalp soft tissues unremarkable. Patient status post bilateral ocular lens replacement. Paranasal sinuses are clear. Small left mastoid effusion noted, of doubtful significance. MRA HEAD FINDINGS ANTERIOR CIRCULATION: Examination moderately degraded by motion artifact. Distal cervical segments of the internal carotid arteries are patent with symmetric antegrade flow. Distal cervical left ICA tortuous. Petrous, cavernous, and supraclinoid segments patent without appreciable stenosis. A1 segments patent bilaterally. Anterior cerebral arteries patent to their distal aspects without definite stenosis. Anterior communicating artery not well assessed on this motion degraded exam. No M1 occlusion. Possible short-segment mild distal left M1 stenosis noted (series 1052, image 11). Normal MCA bifurcations. Distal MCA branches well perfused and grossly symmetric. No vascular abnormality seen underlying the partially visualized left parietal hematoma. POSTERIOR CIRCULATION: Vertebral arteries patent to the vertebrobasilar junction without stenosis. Vertebral arteries largely code dominant. Basilar widely patent to its distal aspect. Superior cerebral arteries patent bilaterally. Both of the posterior cerebral arteries primarily supplied via the basilar and are well perfused to their distal aspects. No definite flow-limiting stenosis. Prominent left  posterior communicating artery noted. No visible aneurysm or other vascular abnormality on this motion degraded exam. MRA NECK FINDINGS Examination technically limited by motion artifact. Visualized aortic arch of normal caliber with normal 3 vessel morphology. No flow-limiting stenosis about the origin of the great vessels. Visualized subclavian arteries widely patent. Right common carotid artery widely patent from its origin to the bifurcation. Mild atheromatous irregularity about the right bifurcation without hemodynamically significant  stenosis. Right ICA patent distally to the circle-of-Willis without stenosis or occlusion. Left common carotid artery patent from its origin to the bifurcation without stenosis. No significant atheromatous narrowing about the left bifurcation. Left ICA tortuous but patent to the circle-of-Willis without stenosis or occlusion. Both of the vertebral arteries arise from the subclavian arteries. Vertebral arteries largely code dominant. Probable short-segment moderate stenosis at the origin of the left vertebral artery (series 1084, image 14). Vertebral arteries otherwise widely patent within the neck without stenosis or occlusion. IMPRESSION: MRI HEAD IMPRESSION: 1. Stable size of acute intraparenchymal hemorrhage centered at the left parietal convexity, estimated volume 25 cc. Surrounding vasogenic edema with mild regional mass effect without midline shift. No identifiable etiology identified on this exam. 2. Otherwise normal brain MRI for age. MRA HEAD IMPRESSION: 1. Technically limited exam due to moderate motion degradation. 2. Grossly negative intracranial MRA. No large vessel occlusion, hemodynamically significant stenosis, or other acute vascular process. No vascular abnormality seen underlying the left parietal hemorrhage. MRA NECK IMPRESSION: 1. Wide patency of the carotid artery systems bilaterally within the neck. 2. Short-segment moderate stenosis at the origin of the  left vertebral artery. Vertebral arteries otherwise widely patent within the neck. Vertebral arteries are co dominant. Electronically Signed   By: Jeannine Boga M.D.   On: 03/04/2019 01:21   Mr Brain W Contrast  Result Date: 03/04/2019 CLINICAL DATA:  Intracranial hemorrhage.  History of lung cancer. EXAM: MRI HEAD WITH CONTRAST TECHNIQUE: Multiplanar, multiecho pulse sequences of the brain and surrounding structures were obtained with intravenous contrast. CONTRAST:  6 mL Gadavist COMPARISON:  Noncontrast MRI of the brain 03/03/2019 FINDINGS: Postcontrast T1-weighted imaging of the brain shows multiple intraparenchymal contrast-enhancing lesions. 1. Left cerebellum 5 x 5 mm, 2:40 2. Right temporal lobe 2 mm, 2:51 3. Peripheral contrast enhancement at the site of the known intraparenchymal hematoma in the posterior left parietal lobe, altogether measuring 3.7 x 2.9 cm, 2:87 4. Posterior left insula 5 x 5 mm, 2:91 5. Left frontal precentral gyrus 6 x 6 mm, 2:124 No contrast-enhancing lesions of the calvarium or extracranial soft tissues. IMPRESSION: 1. Five intraparenchymal metastatic lesions, the largest of which is located in the posterior left parietal lobe where the associated hemorrhage makes accurate measurement difficult. 2. Unchanged size of hematoma. Electronically Signed   By: Ulyses Jarred M.D.   On: 03/04/2019 17:57   Mr Jeri Cos QJ Contrast  Result Date: 03/09/2019 CLINICAL DATA:  Brain metastases with history of lung cancer. Brain lab and SRS protocol for treatment planning. EXAM: MRI HEAD WITHOUT AND WITH CONTRAST TECHNIQUE: Multiplanar, multiecho pulse sequences of the brain and surrounding structures were obtained without and with intravenous contrast. CONTRAST:  6 mL Gadavist COMPARISON:  Postcontrast brain MRI 03/04/2019 and noncontrast brain MRI 03/03/2019 FINDINGS: Brain: Postcontrast sequences are mildly to moderately motion degraded which reduces sensitivity for detection of small  lesions. The hemorrhagic left parieto-occipital mass has not significantly changed in size upon remeasurement, currently measuring 4.1 x 3.2 x 4.6 cm. Mild-to-moderate surrounding edema is stable to minimally increased with mild mass effect on the left lateral ventricle but no significant midline shift. Smaller unchanged lesions measure 3 mm in the left cerebellum (series 11, image 37), 5 mm in the posterior left insula (series 11, image 79), and 6 mm in the posterior left frontal lobe (series 11, image 113). The 2 mm right temporal lobe lesion is not well seen due to motion artifact but is faintly visible on the coronal sequence (  series 12, image 27). No new lesions are identified. No acute infarct or extra-axial fluid collection is evident. There is a background of mild chronic small vessel ischemic disease in the cerebral white matter and pons. Mild cerebral atrophy is not greater than expected for age. Vascular: Major intracranial vascular flow voids are preserved. 3 mm focus of enhancement in a left parietal sulcus (series 11, image 84) is associated with a vessel and is most compatible with an aneurysm rather than metastasis. Skull and upper cervical spine: No suspicious marrow lesion. Sinuses/Orbits: Bilateral cataract extraction. Clear paranasal sinuses. Small left mastoid effusion. Other: None. IMPRESSION: 1. Motion degraded postcontrast imaging. 2. 5 intracranial metastases including a hemorrhagic left parieto-occipital lesion with mild-to-moderate edema. 3. 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 Electronically Signed   By: Logan Bores M.D.   On: 03/09/2019 17:02   Ct Head Code Stroke Wo Contrast  Result Date: 03/03/2019 CLINICAL DATA:  Code stroke. Acute onset of aphasia and right-sided weakness. EXAM: CT HEAD WITHOUT CONTRAST TECHNIQUE: Contiguous axial images were obtained from the base of the skull through the  vertex without intravenous contrast. COMPARISON:  CT head without contrast 09/11/2016 FINDINGS: Brain: An acute left parietal and occipital hemorrhage measures 3.5 x 3.7 x 3.0 cm. There is surrounding edema with effacement of the adjacent sulci and minimal mass effect on the posterior horn of the left lateral ventricle. 3 mm midline shift is present. No other focal hemorrhage is present. No other mass lesion or acute infarct is evident. Basal ganglia are intact. Insular ribbon is normal. No significant extra-axial fluid collection is present. Vascular: Minimal calcifications are present. There is no hyperdense vessel. Skull: Calvarium is intact. No focal lytic or blastic lesions are present. Sinuses/Orbits: The paranasal sinuses and mastoid air cells are clear. Bilateral lens replacements are noted. Globes and orbits are otherwise unremarkable. IMPRESSION: 1. Left parietal and occipital parenchymal hematoma measuring up to 3.7 cm. This is nonspecific. It is most likely related to hypertension or underlying vasculitis in a patient of this age. The differential diagnosis includes metastatic disease or other vascular malformation. No other lesions are evident. 2. Surrounding edema and mass effect is mostly local with minimal midline shift of 3-4 mm. 3. No other focal lesions or hemorrhage. Critical Value/emergent results were called by telephone at the time of interpretation on 03/03/2019 at 1:39 pm to Dr. Gerlene Fee , who verbally acknowledged these results. Electronically Signed   By: San Morelle M.D.   On: 03/03/2019 13:46      Transthoracic Echocardiogram  02/22/2019 normal ejection fraction 60 to 65%.  No wall motion abnormalities.  HISTORY OF PRESENT ILLNESS [name] is a 78 y.o. female with history of trigeminal neuralgia, on home O2, IBS, dysphagia, diastolic dysfunction, COPD, aortic aneurysm and anxiety.  Per notes patient presented to the [facility] ED as a code stroke.  Patient was  aphasic and unable to provide history at that time.  Son was at the bedside and assisted with a history.  By report, neighbor who checks on the patient found her slightly confused yesterday 03/02/2019 around 1700 hrs (LKW).  Today 03/03/2019 when the neighbor went back over, the patient was unable to communicate and was noted to have right-sided weakness and subsequently EMS was called.  Son also noted that the patient had been complaining of headaches since Thursday. Patient underwent a CT while in the ED  and was found to have a left parietal-occipital hemorrhage.  Neurosurgery was consulted and determined that there was no indication for neurosurgical intervention.  Patient was then transferred to [facility] for further care.  During the Neurology assessment, the patient has continued difficulty following commands. She has nonsensical, fluent speech and is unable to give any history.  Premorbid modified Rankin scale (mRS): 0 NIH stroke score of 8 ICH Score: 0  HOSPITAL COURSE Ms. Shelise NEHEMIAH MONTEE is a 78 y.o. female with history of lung cancer, trigeminal neuralgia, on home O2, IBS, dysphagia, diastolic dysfunction, COPD, aortic aneurysm and anxiety presenting with left parietal/occipital ICH. Possible etiology is HTN, yet given her cancer history MRI with contrast shows multiple enhancing metastatic lesions with hemorrhage into one such lesion as a cause of her intracerebral hemorrhage. Neuro Oncology consult.  ICH - left parietal lobar hematoma - Etiology could be due to underlying metastatic hemorrhage given history of lung cancer and MRI confirmed brain metastases lesions  CT head left parietal and occipital ICH  MRI head -stable IPH ~25cc volume; some surrounding vasogenic edema, no evidence of typical CAA  MRA head - neg for underlying vascular process  MRI brain with contrast : At least 5 enhancing metastatic lesions with the largest one in the left posterior parietal at the  location of the hemorrhage  Repeat MRI brain with and without contrast per [facility] protocol 5 intracranial mets w/ hemorrhagic L parieto-occipital lesion w/ mid to mod edema. 74mm L parietal sulcus c/w aneurysm.  2D Echo - EF 60 to 65%  [employer] Virus 2 neg  LDL 155  HgbA1c 5.1  No thinners or ASA prior to admit; currently not indicated  Therapy recommendations:  SNF  Disposition:  SNF - initially denied by insurance, approved after peer to peer  Brain metastasis  History of lung cancer  MRI w/gad - 5 small/punctate enhancement underlying metastatic lesions.   Repeat MRI brain with and without contrast per [facility] protocol confirmed 5 intracranial mets  Neuro Onc consulted with plan for biopsy on Monday, 03/16/2019.   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 Recommend followup by ultrasound in 3 years.   COPD on home oxygen  Trigeminal neuralgia   DISCHARGE EXAM Blood pressure 107/74, pulse 78, temperature 97.6 F (36.4 C), temperature source Oral, resp. rate 18, height 5\' 5"  (1.651 m), weight 57 kg, SpO2 96 %. General - Well nourished, well developed, in no apparent distress.  Ophthalmologic - fundi not visualized due to noncooperation.  Cardiovascular - Regular rate and rhythm.  Mental Status -  Level of arousal and  orientation to time, place, and person were intact. Language including expression, naming, repetition, comprehension was assessed and found intact.  Cranial Nerves II - XII - II - Visual field intact OU. III, IV, VI - Extraocular movements intact. V -  Facial sensation intact bilaterally. VII - Facial movement intact bilaterally. VIII - Hearing & vestibular intact bilaterally. X - Palate elevates symmetrically. XI - Chin turning & shoulder shrug intact bilaterally. XII - Tongue protrusion intact.  Motor Strength - The patient's strength was normal in all extremities and pronator drift was absent.  Bulk was normal and fasciculations were absent.   Motor Tone - Muscle tone was assessed at the neck and appendages and was normal.  Reflexes - The patient's reflexes were symmetrical in all extremities and she had no pathological reflexes.  Sensory - Light touch, temperature/pinprick were assessed and were symmetrical.    Coordination - The patient had normal movements in the hands with no ataxia or dysmetria.  Tremor was absent  Gait and Station - deferred.  Discharge Diet   Regular thin liquids  DISCHARGE PLAN  Disposition:  Skilled nursing facility for ongoing PT, OT and ST.   Due to hemorrhage and risk of bleeding, do not take aspirin, aspirin-containing medications, or ibuprofen products   stereotactic radiosurgery Tues 6/9 followed by surgical resection of the large L parietal-occipital lesion on 6/10 by Dr. Kathyrn Sheriff - his office to provide instruction  Continue home O2 for COPD  Ongoing stroke risk factor control by Primary Care Physician at time of discharge  Follow-up [name], MD or MD at [facility] in 4 weeks.  Follow-up in [facility] in 6 weeks, office to schedule an appointment.   35 minutes were spent preparing discharge.  Rosalin Hawking, MD PhD Stroke Neurology 03/12/2019 3:55 PM

## 2019-03-12 NOTE — Progress Notes (Signed)
  Speech Language Pathology Treatment: Cognitive-Linquistic(Aphasia )  Patient Details Name: [name] MRN: [id number] DOB: 15-Sep-1941 Today's Date: 03/12/2019 Time: 9476-5465 SLP Time Calculation (min) (ACUTE ONLY): 10 min  Assessment / Plan / Recommendation Clinical Impression  Pt was seen for aphasia treatment and was cooperative throughout the session. She stated that she is still having some difficulty with sequencing and word retrieval when there are more distractions and she is not comfortable with her conversational partner. She was educated regarding compensatory strategies for word retrieval and verbalized understanding regarding them. She participated in conversation with the SLP and required occasional cues to use strategies but was frequently able to retrieve words with additional processing time. The pt currently has discharge orders and her session was terminated prematurely due to the arrival of transport but she will benefit from continued SLP services following discharge.    HPI HPI: Pt is a 77 y.o. female with history of trigeminal neuralgia, on home O2, IBS, dysphagia, diastolic dysfunction, COPD, aortic aneurysm and anxiety.  Patient presented to [facility] ED as a code stroke secondary to aphasia and right-sided weakness. She was transferred to [facility] for further care. MRI of the brain revealed acute intraparenchymal hemorrhage centered at the left parietal convexity, estimated volume 25 cc. Surrounding vasogenic edema with mild regional mass effect without midline shift.      SLP Plan  Continue with current plan of care       Recommendations                   Follow up Recommendations: Skilled Nursing facility SLP Visit Diagnosis: Aphasia (R47.01) Plan: Continue with current plan of care       Tonica Brasington I. [name], [place], [place] Office number [phone] Pager [place] 03/12/2019, 2:25 PM

## 2019-03-12 NOTE — Progress Notes (Signed)
Patient being discharged to [facility].  Report given to [facility].  IV removed by the previous shift.  Discharge instructions and prescription information given to the patient who verbalized understanding.

## 2019-03-12 NOTE — Progress Notes (Signed)
Occupational Therapy Treatment Patient Details Name: [name] MRN: [id number] DOB: June 11, 1941 Today's Date: 03/12/2019    History of present illness Pt is a 78 y/o female presents with aphasia and R sided weakness. 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 Will likely have surgery to address mets; neurosurgery following. PMH: anxiety, COPD on home oxgyen, depression, dsyphagia, trigeminal neuralgia, aortic aneurysm.     OT comments  Pt performing toilet hygiene with minguardA for stability; pt ambulating in room a few laps "to get more energy" with minguardA and RW. Pt performing grooming in standing at sink with minguardA. Pt performing LB dressing with increased time in standing, but minguardA. Pt would benefit from continued OT skilled services for ADL, mobility and safety in SNF setting. OT to follow acutely.  O2 >88% on 2L [place] requiring pursed lip breathing>90%.    Follow Up Recommendations  Supervision/Assistance - 24 hour;SNF    Equipment Recommendations  Other (comment)(to be determined at next venue)    Recommendations for Other Services      Precautions / Restrictions Precautions Precautions: Fall Restrictions Weight Bearing Restrictions: No       Mobility Bed Mobility Overal bed mobility: Needs Assistance Bed Mobility: Supine to Sit;Sit to Supine     Supine to sit: Supervision Sit to supine: Supervision   General bed mobility comments: for line management  Transfers Overall transfer level: Needs assistance Equipment used: Rolling walker (2 wheeled) Transfers: Sit to/from Stand Sit to Stand: Min guard              Balance Overall balance assessment: Needs assistance Sitting-balance support: No upper extremity supported;Feet supported Sitting balance-Leahy Scale: Good     Standing balance support: During  functional activity;Single extremity supported;Bilateral upper extremity supported Standing balance-Leahy Scale: Poor Standing balance comment: Reliant on BUE support                            ADL either performed or assessed with clinical judgement   ADL Overall ADL's : Needs assistance/impaired                         Toilet Transfer: Min guard;Ambulation;RW   Toileting- Water quality scientist and Hygiene: Min guard;Sit to/from stand Toileting - Clothing Manipulation Details (indicate cue type and reason): assist for hygiene support      Functional mobility during ADLs: Min guard;Rolling walker General ADL Comments: Pt limited by fair safety awareness and decreased activity tolerance     Vision       Perception     Praxis      Cognition Arousal/Alertness: Awake/alert Behavior During Therapy: WFL for tasks assessed/performed Overall Cognitive Status: Impaired/Different from baseline                                          Exercises     Shoulder Instructions       General Comments >88% on 2L [place] O2 requiring pursed lip breathing techniques    Pertinent Vitals/ Pain       Pain Assessment: No/denies pain  Home Living  Prior Functioning/Environment              Frequency  Min 2X/week        Progress Toward Goals  OT Goals(current goals can now be found in the care plan section)  Progress towards OT goals: Progressing toward goals  Acute Rehab OT Goals Patient Stated Goal: to get to rehab Time For Goal Achievement: 03/18/19 Potential to Achieve Goals: Good ADL Goals Pt Will Perform Grooming: with set-up;standing Pt Will Perform Upper Body Bathing: with set-up;sitting Pt Will Perform Lower Body Dressing: with set-up;sit to/from stand Pt Will Transfer to Toilet: with supervision;ambulating;regular height toilet Pt Will Perform Toileting - Clothing  Manipulation and hygiene: with supervision;sit to/from stand Additional ADL Goal #1: Pt will follow 1 step commands with 90% accuracy in order to maximize participation in ADLs.  Plan Discharge plan remains appropriate    Co-evaluation                 AM-PAC OT "6 Clicks" Daily Activity     Outcome Measure   Help from another person eating meals?: None Help from another person taking care of personal grooming?: A Little Help from another person toileting, which includes using toliet, bedpan, or urinal?: A Little Help from another person bathing (including washing, rinsing, drying)?: A Little Help from another person to put on and taking off regular upper body clothing?: A Little Help from another person to put on and taking off regular lower body clothing?: A Little 6 Click Score: 19    End of Session Equipment Utilized During Treatment: Gait belt;Oxygen;Rolling walker  OT Visit Diagnosis: Other abnormalities of gait and mobility (R26.89);Muscle weakness (generalized) (M62.81);Cognitive communication deficit (R41.841);Other symptoms and signs involving the nervous system (R29.898);Other symptoms and signs involving cognitive function;History of falling (Z91.81)   Activity Tolerance Patient tolerated treatment well   Patient Left with call bell/phone within reach;in bed;with bed alarm set   Nurse Communication Mobility status        Time: 7482-7078 OT Time Calculation (min): 26 min  Charges: OT General Charges $OT Visit: 1 Visit OT Treatments $Self Care/Home Management : 23-37 mins  Ebony Hail Harold Hedge) Marsa Aris OTR/L Acute Rehabilitation Services Pager: [phone] Office: [place] 03/12/2019, 2:31 PM

## 2019-03-12 NOTE — TOC Transition Note (Signed)
Transition of Care [facility]) - CM/SW Discharge Note   Patient Details  Name: [name] MRN: [id number] Date of Birth: August 19, 1941  Transition of [facility] CM/SW Contact:  Geralynn Ochs, LCSW Phone Number: 03/12/2019, 1:01 PM   Clinical Narrative:  Nurse to call report to [phone], Room 604A     Final next level of care: Skilled Nursing Facility Barriers to Discharge: Barriers Resolved   Patient Goals and CMS Choice Patient states their goals for this hospitalization and ongoing recovery are:: patient unable to state goal at this time; son wants the patient to get well and get back home CMS Medicare.gov Compare Post Acute Care list provided to:: Patient Represenative (must comment) Choice offered to / list presented to : Adult Children  Discharge Placement              Patient chooses bed at: [facility] Patient to be transferred to facility by: [facility] Name of family member notified: Self, Quillian Quince Patient and family notified of of transfer: 03/12/19  Discharge Plan and Services     Post Acute Care Choice: [place]                               Social Determinants of Health (SDOH) Interventions     Readmission Risk Interventions No flowsheet data found.

## 2019-03-12 NOTE — Progress Notes (Signed)
  Radiation Oncology         [phone] ________________________________  Name: [name] MRN: [id number]  Date: 03/10/2019  DOB: 22-Mar-1941  SIMULATION AND TREATMENT PLANNING NOTE    ICD-10-CM   1. 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  Identity was confirmed.  All relevant records and images related to the planned course of therapy were reviewed.  The patient freely provided informed written consent to proceed with treatment after reviewing the details related to the planned course of therapy. The consent form was witnessed and verified by the simulation staff. Intravenous access was established for contrast administration. Then, the patient was set-up in a stable reproducible supine position for radiation therapy.  A relocatable thermoplastic stereotactic head frame was fabricated for precise immobilization.  CT images were obtained.  Surface markings were placed.  The CT images were loaded into the planning software and fused with the patient's targeting MRI scan.  Then the target and avoidance structures were contoured.  Treatment planning then occurred.  The radiation prescription was entered and confirmed.  I have requested 3D planning  I have requested a DVH of the following structures: Brain stem, brain, left eye, right eye, lenses, optic chiasm, target volumes, uninvolved brain, and normal tissue.    SPECIAL TREATMENT PROCEDURE:  The planned course of therapy using radiation constitutes a special treatment procedure. Special care is required in the management of this patient for the following reasons. This treatment constitutes a Special Treatment Procedure for the following reason: High dose per fraction requiring special monitoring for increased toxicities of treatment including daily imaging.  The special nature of the planned course  of radiotherapy will require increased physician supervision and oversight to ensure patient's safety with optimal treatment outcomes.  PLAN:  The patient will receive 20 Gy in 1 fraction to the four smaller metastases, and 18 Gy pre-op to the largest lesion to be followed by resection.  ________________________________  Sheral Apley Tammi Klippel, M.D.

## 2019-03-12 NOTE — Plan of Care (Signed)
Progressing towards goals

## 2019-03-13 ENCOUNTER — Telehealth: Payer: Self-pay | Admitting: Orthopedic Surgery

## 2019-03-13 NOTE — Telephone Encounter (Signed)
Pt canceled 03/25/19 appointment stating that she has had a stroke and the cancer in her lung has spreaded to her brain.She wanted to let you know.

## 2019-03-16 ENCOUNTER — Telehealth: Payer: Self-pay | Admitting: Radiation Oncology

## 2019-03-16 NOTE — Telephone Encounter (Signed)
Phoned [facility] at [phone]. Spoke with Purvis Kilts in the transportation department. She confirms this patient have travel arrangement for tomorrow from [facility] to [facility] radiation for 2:30 pm treatment.

## 2019-03-17 ENCOUNTER — Encounter (HOSPITAL_COMMUNITY): Payer: Self-pay | Admitting: *Deleted

## 2019-03-17 ENCOUNTER — Other Ambulatory Visit: Payer: Self-pay

## 2019-03-17 ENCOUNTER — Encounter: Payer: Self-pay | Admitting: Radiation Oncology

## 2019-03-17 ENCOUNTER — Ambulatory Visit
Admission: RE | Admit: 2019-03-17 | Discharge: 2019-03-17 | Disposition: A | Discharge status: Home / Self Care | Payer: PPO | Source: Ambulatory Visit | Attending: Radiation Oncology | Admitting: Radiation Oncology

## 2019-03-17 VITALS — BP 123/77 | HR 78 | Temp 98.9°F | Resp 20

## 2019-03-17 DIAGNOSIS — Z7951 Long term (current) use of inhaled steroids: Secondary | ICD-10-CM | POA: Diagnosis not present

## 2019-03-17 DIAGNOSIS — J449 Chronic obstructive pulmonary disease, unspecified: Secondary | ICD-10-CM | POA: Diagnosis present

## 2019-03-17 DIAGNOSIS — C7931 Secondary malignant neoplasm of brain: Secondary | ICD-10-CM

## 2019-03-17 DIAGNOSIS — Z85118 Personal history of other malignant neoplasm of bronchus and lung: Secondary | ICD-10-CM | POA: Diagnosis not present

## 2019-03-17 DIAGNOSIS — C713 Malignant neoplasm of parietal lobe: Secondary | ICD-10-CM | POA: Diagnosis not present

## 2019-03-17 DIAGNOSIS — Z79890 Hormone replacement therapy (postmenopausal): Secondary | ICD-10-CM | POA: Diagnosis not present

## 2019-03-17 DIAGNOSIS — Z90710 Acquired absence of both cervix and uterus: Secondary | ICD-10-CM | POA: Diagnosis not present

## 2019-03-17 DIAGNOSIS — C3490 Malignant neoplasm of unspecified part of unspecified bronchus or lung: Secondary | ICD-10-CM | POA: Diagnosis not present

## 2019-03-17 DIAGNOSIS — E039 Hypothyroidism, unspecified: Secondary | ICD-10-CM | POA: Diagnosis present

## 2019-03-17 DIAGNOSIS — Z1159 Encounter for screening for other viral diseases: Secondary | ICD-10-CM | POA: Diagnosis not present

## 2019-03-17 DIAGNOSIS — Z79899 Other long term (current) drug therapy: Secondary | ICD-10-CM | POA: Diagnosis not present

## 2019-03-17 DIAGNOSIS — D496 Neoplasm of unspecified behavior of brain: Secondary | ICD-10-CM | POA: Diagnosis not present

## 2019-03-17 DIAGNOSIS — I10 Essential (primary) hypertension: Secondary | ICD-10-CM | POA: Diagnosis not present

## 2019-03-17 DIAGNOSIS — Z87891 Personal history of nicotine dependence: Secondary | ICD-10-CM | POA: Diagnosis not present

## 2019-03-17 DIAGNOSIS — Z9981 Dependence on supplemental oxygen: Secondary | ICD-10-CM | POA: Diagnosis not present

## 2019-03-17 NOTE — Progress Notes (Signed)
Nurse monitoring complete following pre op SRS treatment. Patient alert and oriented x 3. No distress noted. Denies development of any new neurologic symptoms. Patient discharged into her son's care. Patient riding in wheelchair.   BP 123/77 (BP Location: Left Arm, Patient Position: Sitting)   Pulse 78   Temp 98.9 F (37.2 C) (Oral)   Resp 20   SpO2 96%

## 2019-03-17 NOTE — H&P (Deleted)
Chief Complaint   Brain tumor  HPI   HPI: [name] is a 78 y.o. female   With history of lung cancer on the home oxygen, IBS, CHF, COPD, aortic aneurysm and anxietywho presented take [name] emergency room as a code stroke on 03/03/2019 after a neighbor found her aphasic with right-sided weakness, LKN day prior.  She underwent a CT scan while in the emergency room and was found to have a left parietal occipital hemorrhage without evidence of hydrocephalus.  While there was no indication for neurosurgical intervention, after undergoing an MRI/ MRA of the brain for further workup she was found to have multiple enhancing metastatic lesions with hemorrhage into 1 of the lesions, likely the cause or intracerebral hemorrhage.   She underwent SRS treatment yesterday, and presents today for tumor resection of the large left parietal-occipital lesion. She is without any concerns.  Patient Active Problem List   Diagnosis Date Noted  . Essential hypertension 03/12/2019  . Brain metastases ([place]) 03/06/2019  . Cytotoxic brain edema ([place]) 03/04/2019  . ICH (intracerebral hemorrhage) (H[facility]) - L parietal lobe 03/03/2019  . Osteoporosis 05/16/2018  . S/P ORIF (open reduction internal fixation) fracture right patella 03/18/18 03/26/2018  . Cellulitis, wound, post-operative 03/22/2018  . Fracture of right patella   . Normocytic anemia   . Esophageal dysphagia 11/27/2017  . Heme positive stool 11/27/2017  . Iron deficiency anemia 12/05/2016  . SIADH (syndrome of inappropriate ADH production) ([place])   . Polypharmacy 09/14/2016  . Muscle weakness (generalized)   . Syncope 09/10/2016  . Rib pain on left side   . Hypersomnia 02/16/2016  . Acute bronchitis 02/16/2016  . Exertional dyspnea 12/20/2015  . HLD (hyperlipidemia) 11/23/2014  . Change in bowel habits 11/10/2014  . Abdominal pain, left lower quadrant 11/10/2014  . Emphysema lung ([place]) 11/08/2014  . Helicobacter pylori gastritis 07/30/2014   . Acute on chronic respiratory failure with hypoxia ([place]) 04/10/2014  . Wrist fracture 04/09/2014  . 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 T12 burst fracture ([place]) 01/13/2014  . Constipation 12/14/2013  . Acute respiratory failure with hypoxia ([place]) 12/14/2013  . COPD (chronic obstructive pulmonary disease) ([place]) 12/14/2013  . Compression fracture of thoracic vertebra ([place]) 12/14/2013  . Hypoxia 12/13/2013  . Compression fracture 12/13/2013  . Diastolic dysfunction   . 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[place]) 08/11/2013  . AAA (abdominal aortic aneurysm) without rupture ([place]) 02/26/2012  . Dyspepsia 02/11/2012  . Aneurysm ([place]) 02/11/2012  . Trigeminal neuralgia   . Epigastric pain 02/21/2011  . Dysphagia 02/21/2011  . Adenocarcinoma of lung ([place]) 12/07/2010  . HELICOBACTER PYLORI GASTRITIS, HX OF 12/30/2009  . CONTRAST DYE ALLERGY 12/30/2009    PMH: Past Medical History:  Diagnosis Date  . Adenocarcinoma of lung ([place]) 12/2010   left lung/surg only  . Allergic rhinitis   . 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)    corrected by surgery 08/21/13  . Anxiety disorder   . Aortic aneurysm ([place])   . Complication of anesthesia 2011   bloodpressure dropped during colonoscopy, not problems since  . Compression fracture   . COPD (chronic obstructive pulmonary disease) ([place])   . Depression   . Diastolic dysfunction   . Diverticulosis   . Dysphagia   . Emphysema lung ([place]) 11/08/2014  . Hiatal hernia   . Hypothyroidism   . IBS (irritable bowel syndrome)   . Iron deficiency anemia due to chronic blood loss 12/05/2016  . On  home O2 12/15/2013   chronic hypoxia  . PUD (peptic ulcer disease)    50yrs  . Shortness of breath    with exertion  . SIADH (syndrome of inappropriate ADH production) ([place])   . Trigeminal neuralgia     PSH: Past Surgical History:  Procedure Laterality Date  . ABDOMINAL HYSTERECTOMY    . BACK SURGERY  January 13, 2014  .  CATARACT EXTRACTION    . CATARACT EXTRACTION W/PHACO  09/02/2012   Procedure: CATARACT EXTRACTION PHACO AND INTRAOCULAR LENS PLACEMENT (IOC);  Surgeon: Elta Guadeloupe T. Gershon Crane, MD;  Location: [facility];  Service: Ophthalmology;  Laterality: Left;  CDE:10.35  . CHOLECYSTECTOMY    . 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 COLONOSCOPY N/A 11/23/2014   Dr. Oneida Alar: moderate diverticulosis, hemorrhoids, redundant colon. next TCS in 10-15 years.   Marland Kitchen ENDARTERECTOMY Right 08/21/2013   Procedure: RIGHT CAROTID ANEURYSM RESECTION;  Surgeon: Rosetta Posner, MD;  Location: [place];  Service: Vascular;  Laterality: Right;  . ESOPHAGEAL DILATION  02/24/2018   Procedure: ESOPHAGEAL DILATION;  Surgeon: Danie Binder, MD;  [facility];  Service: Endoscopy;;  . ESOPHAGOGASTRODUODENOSCOPY  11/13/2009   w/dilation to 86mm, gastric ulceration (H.Pylori) s/p treatment  . ESOPHAGOGASTRODUODENOSCOPY  11/21/2009   distal esophageal web, gastritis  . ESOPHAGOGASTRODUODENOSCOPY  03/14/12   [id number] in the distal esophagus/Mild gastritis/small HH. + H.pylori, prescribed Pylera. Finished treatment.   . ESOPHAGOGASTRODUODENOSCOPY N/A 12/13/2017   Procedure: ESOPHAGOGASTRODUODENOSCOPY (EGD);  Surgeon: Danie Binder, MD;  [facility];  Service: Endoscopy;  Laterality: N/A;  8:30am  . ESOPHAGOGASTRODUODENOSCOPY N/A 02/24/2018   Procedure: ESOPHAGOGASTRODUODENOSCOPY (EGD);  Surgeon: Danie Binder, MD;  [facility];  Service: Endoscopy;  Laterality: N/A;  11:00am  . fatty tumor removal from lt groin    . FRACTURE SURGERY Left    hand and left ring finger  . GIVENS CAPSULE STUDY N/A 12/26/2017   Procedure: GIVENS CAPSULE STUDY;  Surgeon: Danie Binder, MD;  [facility];  Service: Endoscopy;  Laterality: N/A;  7:30am  . GIVENS CAPSULE STUDY N/A 02/24/2018   Procedure: GIVENS CAPSULE STUDY;  Surgeon: Danie Binder, MD;  [facility];  Service:  Endoscopy;  Laterality: N/A;  . KNEE SURGERY     patella tendon repair june 2019 [place]  . LUNG CANCER SURGERY  12/2010   Left VATS, minithoracotomy, LLL superior segmentectomy  . ORIF PATELLA Right 03/18/2018   Procedure: OPEN REDUCTION INTERNAL (ORIF) FIXATION RIGHT PATELLA;  Surgeon: Carole Civil, MD;  Location: [facility];  Service: Orthopedics;  Laterality: Right;  . ORIF WRIST FRACTURE Left 04/09/2014   Procedure: OPEN REDUCTION INTERNAL FIXATION (ORIF) WRIST FRACTURE;  Surgeon: Renette Butters, MD;  Location: [place];  Service: Orthopedics;  Laterality: Left;  . PERCUTANEOUS PINNING Left 04/09/2014   Procedure: PERCUTANEOUS PINNING EXTREMITY;  Surgeon: Renette Butters, MD;  Location: [place];  Service: Orthopedics;  Laterality: Left;  . SAVORY DILATION N/A 12/13/2017   Procedure: SAVORY DILATION;  Surgeon: Danie Binder, MD;  [facility];  Service: Endoscopy;  Laterality: N/A;  . TUBAL LIGATION    . vocal cord surgery  02/06/2011   laryngoscopy with bilateral vocal cord Radiesse injection for vocal cord paralysis  . YAG LASER APPLICATION Left 93/71/6967   Procedure: YAG LASER APPLICATION;  Surgeon: Rutherford Guys, MD;  Location: [facility];  Service: Ophthalmology;  Laterality: Left;  No medications prior to admission.    SH: Social History   Tobacco Use  . Smoking status: Former Smoker    Packs/day: 0.50    Years: 20.00    Pack years: 10.00    Types: Cigarettes    Last attempt to quit: 12/21/2010    Years since quitting: 8.2  . Smokeless tobacco: Never Used  . Tobacco comment: smoking cessation info given and reviewed   Substance Use Topics  . Alcohol use: Yes    Alcohol/week: 0.0 standard drinks    Comment: seldom  . Drug use: No    MEDS: Prior to Admission medications   Medication Sig Start Date End Date Taking? Authorizing Provider  acetaminophen (TYLENOL) 325 MG tablet Take 650 mg by mouth every 6 (six) hours as needed for moderate pain or headache.    Yes  [provider]  ALPRAZolam (XANAX) 0.25 MG tablet Take 1 tablet (0.25 mg total) by mouth 2 (two) times daily as needed for anxiety. 03/12/19  Yes Donzetta Starch, NP  atorvastatin (LIPITOR) 40 MG tablet Take 1 tablet (40 mg total) by mouth daily at 6 PM. 03/12/19  Yes Biby, Massie Kluver, NP  citalopram (CELEXA) 40 MG tablet Take 0.5 tablets (20 mg total) by mouth at bedtime. 03/12/19  Yes Donzetta Starch, NP  ferrous sulfate 325 (65 FE) MG tablet Take 325 mg by mouth daily with breakfast.   Yes [provider]  fluticasone (FLONASE) 50 MCG/ACT nasal spray Place 2 sprays into both nostrils daily. 12/30/18  Yes [name], MD  levothyroxine (SYNTHROID) 50 MCG tablet Take 1 tablet (50 mcg total) by mouth daily before breakfast. 03/12/19  Yes Donzetta Starch, NP  Multiple Vitamin (MULTIVITAMIN WITH MINERALS) TABS tablet Take 1 tablet by mouth daily.   Yes [provider]  ondansetron (ZOFRAN ODT) 4 MG disintegrating tablet Take 1 tablet (4 mg total) by mouth every 4 (four) hours as needed for nausea or vomiting. 03/12/19  Yes Donzetta Starch, NP  OXYGEN Inhale 2 L into the lungs continuous.   Yes [provider]  pantoprazole (PROTONIX) 40 MG tablet Take 1 tablet (40 mg total) by mouth daily before breakfast. 02/05/19  Yes Annitta Needs, NP  senna-docusate (SENOKOT-S) 8.6-50 MG tablet Take 1 tablet by mouth at bedtime as needed for mild constipation. 03/12/19  Yes Donzetta Starch, NP  umeclidinium-vilanterol (ANORO ELLIPTA) 62.5-25 MCG/INH AEPB Inhale 1 puff into the lungs daily. 06/20/18  Yes [name], MD  denosumab (PROLIA) 60 MG/ML SOSY injection Inject 60 mg into the skin every 6 (six) months. 12/30/18   [name], MD    ALLERGY: Allergies  Allergen Reactions  . Ciprofloxacin Hcl Hives  . Iohexol Other (See Comments)     Desc: Per [facility], pt is allergic to IV contrast, no type of reaction was available. Pt cannot remember but first reacted around 15  yrs ago from an IVP. Notes were date from 2009 at urology center., Onset Date: [id number]   . Prozac [Fluoxetine Hcl] Rash  . Sulfonamide Derivatives Hives and Rash    Social History   Tobacco Use  . Smoking status: Former Smoker    Packs/day: 0.50    Years: 20.00    Pack years: 10.00    Types: Cigarettes    Last attempt to quit: 12/21/2010    Years since quitting: 8.2  . Smokeless tobacco: Never Used  . Tobacco comment: smoking cessation info given and reviewed  Substance Use Topics  . Alcohol use: Yes    Alcohol/week: 0.0 standard drinks    Comment: seldom     Family History  Problem Relation Age of Onset  . Pulmonary embolism Mother   . Heart attack Father   . Hypertension Father   . Liver cancer Sister   . Cancer Sister   . Cancer Brother   . Cancer Daughter   . Colon cancer Neg Hx      ROS   ROS  Exam   There were no vitals filed for this visit. General appearance: WDWN, NAD Eyes: No scleral injection Cardiovascular: Regular rate and rhythm without murmurs, rubs, gallops. No edema or variciosities. Distal pulses normal. Pulmonary: Effort normal, non-labored breathing Musculoskeletal:     Muscle tone upper extremities: Normal    Muscle tone lower extremities: Normal    Motor exam: Upper Extremities Deltoid Bicep Tricep Grip  Right 5/5 5/5 5/5 5/5  Left 5/5 5/5 5/5 5/5   Lower Extremity IP Quad PF DF EHL  Right 5/5 5/5 5/5 5/5 5/5  Left 5/5 5/5 5/5 5/5 5/5   Neurological Mental Status:    - Patient is awake, alert, oriented to person, place, month, year, and situation    - Patient is able to give a clear and coherent history.    - No signs of aphasia or neglect Cranial Nerves    - II: Visual Fields are full. PERRL    - III/IV/VI: EOMI without ptosis or diploplia.     - V: Facial sensation is grossly normal    - VII: Facial movement is symmetric.     - VIII: hearing is intact to voice    - X: Uvula elevates symmetrically    - XI: Shoulder shrug  is symmetric.    - XII: tongue is midline without atrophy or fasciculations.  Sensory: Sensation grossly intact to LT Deep Tendon Reflexes    - 2+ and symmetric in the biceps and patellae. Plantars   - Toes are downgoing bilaterally. Cerebellar    - FNF and HKS are intact bilaterally   Results - Imaging/Labs   No results found for this or any previous visit (from the past 48 hour(s)).  No results found.  IMAGING: MRI/MRA brain was reviewed. There are five intracranial metastases including a hemorrhagic left parieto-occipital lesions, largest being  The left parietal-occipital mass which measures approximately 4 x 3 x 5 cm with mild-moderate surrounding edema.    Impression/Plan   78 y.o. female with multiple enhancing  Intracranial metastatic lesions.  We will proceed with left stereotactic craniotomy for surgical resection of the large left parietal-occipital lesion.  The risks benefits and alternatives to the procedure were discussed.  Patient stated in own language understanding and wished to proceed.

## 2019-03-17 NOTE — Progress Notes (Signed)
Spoke with pt and her son, Quillian Quince for pre-op call. Pt denies cardiac history, HTN or Diabetes. Pt is living in an independent senior living facility. She did not have the Covid test done because she was posted as being in a SNF. Will need testing in the AM.   Coronavirus Screening  Have you experienced the following symptoms:  Cough NO Fever (>100.49F) NO  Runny nose NO Sore throat NO Difficulty breathing/shortness of breath  NO  Have you or a family member traveled in the last 14 days and where? NO  Patient and so reminded that hospital visitation restrictions are in effect and the importance of the restrictions.

## 2019-03-17 NOTE — Op Note (Addendum)
  Name: [name]  MRN: [id number]  Date: 03/17/2019   DOB: 1941/08/09  Stereotactic Radiosurgery Operative Note  PRE-OPERATIVE DIAGNOSIS:  Multiple Brain Metastases  POST-OPERATIVE DIAGNOSIS:  Multiple Brain Metastases  PROCEDURE:  Stereotactic Radiosurgery  SURGEON:  Jairo Ben, MD  RADIATION ONCOLOGIST: Dr. Tyler Pita, MD  NARRATIVE: The patient underwent a radiation treatment planning session in the radiation oncology simulation suite under the care of the radiation oncology physician and physicist.  I participated closely in the radiation treatment planning afterwards. The patient underwent planning CT which was fused to 3T high resolution MRI with 1 mm axial slices.  These images were fused on the planning system.  We contoured the gross target volumes and subsequently expanded this to yield the Planning Target Volume. I actively participated in the planning process.  I helped to define and review the target contours and also the contours of the optic pathway, eyes, brainstem and selected nearby organs at risk.  All the dose constraints for critical structures were reviewed and compared to AAPM Task Group 101.  The prescription dose conformity was reviewed.  I approved the plan electronically.    Accordingly, [name] was brought to the TrueBeam stereotactic radiation treatment linac and placed in the custom immobilization mask.  The patient was aligned according to the IR fiducial markers with Brain[facility] Exactrac, then orthogonal x-rays were used in ExacTrac with the 6DOF robotic table and the shifts were made to align the patient  [name] received stereotactic radiosurgery uneventfully.    Due to the ongoing coronavirus pandemic, I reviewed and approved the plan electronically and was present over video conference during the treatment.  Lesions treated:  5   Complex lesions treated:  1 (>3.5 cm left parietal lesion)  The detailed description of the procedure  is recorded in the radiation oncology procedure note.  I was present for the duration of the procedure.  DISPOSITION:  Following delivery, the patient was transported to nursing in stable condition and monitored for possible acute effects to be discharged to home in stable condition with follow-up in one month.  Jairo Ben, MD 03/17/2019 3:22 PM

## 2019-03-17 NOTE — Anesthesia Preprocedure Evaluation (Addendum)
Anesthesia Evaluation  Patient identified by MRN, date of birth, ID band Patient awake    Reviewed: Allergy & Precautions, H&P , NPO status , Patient's Chart, lab work & pertinent test results  History of Anesthesia Complications Negative for: history of anesthetic complications  Airway Mallampati: II  TM Distance: >3 FB Neck ROM: Full    Dental  (+) Dental Advisory Given, Poor Dentition   Pulmonary neg pulmonary ROS, COPD,  COPD inhaler and oxygen dependent, former smoker,    Pulmonary exam normal        Cardiovascular Exercise Tolerance: Good hypertension, negative cardio ROS Normal cardiovascular exam     Neuro/Psych  Headaches, Anxiety Depression negative neurological ROS  negative psych ROS   GI/Hepatic negative GI ROS, Neg liver ROS, hiatal hernia, PUD,   Endo/Other  negative endocrine ROSHypothyroidism   Renal/GU negative Renal ROS  negative genitourinary   Musculoskeletal   Abdominal   Peds  Hematology negative hematology ROS (+) Blood dyscrasia, anemia ,   Anesthesia Other Findings   Reproductive/Obstetrics negative OB ROS                           Anesthesia Physical Anesthesia Plan  ASA: III  Anesthesia Plan: General   Post-op Pain Management:    Induction: Intravenous  PONV Risk Score and Plan: 4 or greater and Ondansetron, Dexamethasone and Treatment may vary due to age or medical condition  Airway Management Planned: Oral ETT  Additional Equipment: Arterial line  Intra-op Plan:   Post-operative Plan: Extubation in OR and Possible Post-op intubation/ventilation  Informed Consent: I have reviewed the patients History and Physical, chart, labs and discussed the procedure including the risks, benefits and alternatives for the proposed anesthesia with the patient or authorized representative who has indicated his/her understanding and acceptance.     Dental  advisory given  Plan Discussed with: CRNA and Anesthesiologist  Anesthesia Plan Comments:        Anesthesia Quick Evaluation

## 2019-03-18 ENCOUNTER — Inpatient Hospital Stay (HOSPITAL_COMMUNITY): Payer: PPO | Admitting: Anesthesiology

## 2019-03-18 ENCOUNTER — Other Ambulatory Visit: Payer: Self-pay

## 2019-03-18 ENCOUNTER — Other Ambulatory Visit: Payer: Self-pay | Admitting: Radiation Therapy

## 2019-03-18 ENCOUNTER — Encounter (HOSPITAL_COMMUNITY): Payer: Self-pay | Admitting: Critical Care Medicine

## 2019-03-18 ENCOUNTER — Inpatient Hospital Stay (HOSPITAL_COMMUNITY)
Admission: RE | Admit: 2019-03-18 | Discharge: 2019-03-21 | DRG: 027 | Disposition: A | Discharge status: Home Health | Payer: PPO | Attending: Neurosurgery | Admitting: Neurosurgery

## 2019-03-18 ENCOUNTER — Encounter (HOSPITAL_COMMUNITY)
Admission: RE | Disposition: A | Discharge status: Home Health | Payer: Self-pay | Source: Home / Self Care | Attending: Neurosurgery

## 2019-03-18 DIAGNOSIS — Z9981 Dependence on supplemental oxygen: Secondary | ICD-10-CM

## 2019-03-18 DIAGNOSIS — Z1159 Encounter for screening for other viral diseases: Secondary | ICD-10-CM

## 2019-03-18 DIAGNOSIS — C7931 Secondary malignant neoplasm of brain: Secondary | ICD-10-CM | POA: Diagnosis present

## 2019-03-18 DIAGNOSIS — Z79899 Other long term (current) drug therapy: Secondary | ICD-10-CM | POA: Diagnosis not present

## 2019-03-18 DIAGNOSIS — J449 Chronic obstructive pulmonary disease, unspecified: Secondary | ICD-10-CM | POA: Diagnosis present

## 2019-03-18 DIAGNOSIS — Z87891 Personal history of nicotine dependence: Secondary | ICD-10-CM | POA: Diagnosis not present

## 2019-03-18 DIAGNOSIS — Z7951 Long term (current) use of inhaled steroids: Secondary | ICD-10-CM

## 2019-03-18 DIAGNOSIS — Z79890 Hormone replacement therapy (postmenopausal): Secondary | ICD-10-CM | POA: Diagnosis not present

## 2019-03-18 DIAGNOSIS — Z85118 Personal history of other malignant neoplasm of bronchus and lung: Secondary | ICD-10-CM | POA: Diagnosis not present

## 2019-03-18 DIAGNOSIS — Z90710 Acquired absence of both cervix and uterus: Secondary | ICD-10-CM | POA: Diagnosis not present

## 2019-03-18 DIAGNOSIS — E039 Hypothyroidism, unspecified: Secondary | ICD-10-CM | POA: Diagnosis present

## 2019-03-18 DIAGNOSIS — D496 Neoplasm of unspecified behavior of brain: Secondary | ICD-10-CM | POA: Diagnosis present

## 2019-03-18 DIAGNOSIS — C713 Malignant neoplasm of parietal lobe: Secondary | ICD-10-CM | POA: Diagnosis not present

## 2019-03-18 HISTORY — PX: APPLICATION OF CRANIAL NAVIGATION: SHX6578

## 2019-03-18 HISTORY — DX: Headache, unspecified: R51.9

## 2019-03-18 HISTORY — DX: Personal history of urinary calculi: Z87.442

## 2019-03-18 HISTORY — PX: CRANIOTOMY: SHX93

## 2019-03-18 HISTORY — DX: Pneumonia, unspecified organism: J18.9

## 2019-03-18 LAB — TYPE AND SCREEN
ABO/RH(D): O NEG
Antibody Screen: NEGATIVE

## 2019-03-18 LAB — CREATININE, SERUM
Creatinine, Ser: 0.52 mg/dL (ref 0.44–1.00)
GFR calc Af Amer: >60 mL/min (ref >60)
GFR calc non Af Amer: >60 mL/min (ref >60)

## 2019-03-18 LAB — CBC
HCT: 33 % — ABNORMAL LOW (ref 36.0–46.0)
Hemoglobin: 10.9 g/dL — ABNORMAL LOW (ref 12.0–15.0)
MCH: 29.9 pg (ref 26.0–34.0)
MCHC: 33 g/dL (ref 30.0–36.0)
MCV: 90.7 fL (ref 80.0–100.0)
Platelets: 323 10*3/uL (ref 150–400)
RBC: 3.64 MIL/uL — ABNORMAL LOW (ref 3.87–5.11)
RDW: 12.4 % (ref 11.5–15.5)
WBC: 14.7 10*3/uL — ABNORMAL HIGH (ref 4.0–10.5)
nRBC: 0 % (ref 0.0–0.2)

## 2019-03-18 LAB — SARS CORONAVIRUS 2 BY RT PCR (HOSPITAL ORDER, PERFORMED IN ~~LOC~~ HOSPITAL LAB): SARS Coronavirus 2: NEGATIVE

## 2019-03-18 SURGERY — CRANIOTOMY TUMOR EXCISION
Anesthesia: General

## 2019-03-18 MED ORDER — FENTANYL CITRATE (PF) 100 MCG/2ML IJ SOLN
INTRAMUSCULAR | Status: AC
  Administered 2019-03-18: 50 ug via INTRAVENOUS
  Filled 2019-03-18: qty 2

## 2019-03-18 MED ORDER — PROPOFOL 10 MG/ML IV BOLUS
INTRAVENOUS | Status: DC | PRN
  Administered 2019-03-18: 100 mg via INTRAVENOUS
  Administered 2019-03-18: 50 mg via INTRAVENOUS

## 2019-03-18 MED ORDER — DEXAMETHASONE SODIUM PHOSPHATE 4 MG/ML IJ SOLN
4.0000 mg | Freq: Four times a day (QID) | INTRAMUSCULAR | Status: DC
  Filled 2019-03-18: qty 1

## 2019-03-18 MED ORDER — GLYCOPYRROLATE PF 0.2 MG/ML IJ SOSY
PREFILLED_SYRINGE | INTRAMUSCULAR | Status: DC | PRN
  Administered 2019-03-18 (×2): .1 mg via INTRAVENOUS

## 2019-03-18 MED ORDER — LABETALOL HCL 5 MG/ML IV SOLN: 10.0000 mg | INTRAVENOUS | Status: DC | PRN

## 2019-03-18 MED ORDER — DEXAMETHASONE SODIUM PHOSPHATE 10 MG/ML IJ SOLN
INTRAMUSCULAR | Status: AC
  Filled 2019-03-18: qty 1

## 2019-03-18 MED ORDER — CEFAZOLIN SODIUM-DEXTROSE 2-4 GM/100ML-% IV SOLN
2.0000 g | Freq: Three times a day (TID) | INTRAVENOUS | Status: AC
  Administered 2019-03-18 – 2019-03-19 (×2): 2 g via INTRAVENOUS
  Filled 2019-03-18 (×2): qty 100

## 2019-03-18 MED ORDER — ROCURONIUM BROMIDE 10 MG/ML (PF) SYRINGE
PREFILLED_SYRINGE | INTRAVENOUS | Status: AC
  Filled 2019-03-18: qty 10

## 2019-03-18 MED ORDER — EPHEDRINE 5 MG/ML INJ
INTRAVENOUS | Status: AC
  Filled 2019-03-18: qty 10

## 2019-03-18 MED ORDER — DEXAMETHASONE SODIUM PHOSPHATE 4 MG/ML IJ SOLN
4.0000 mg | Freq: Four times a day (QID) | INTRAMUSCULAR | Status: AC
  Administered 2019-03-19 – 2019-03-20 (×3): 4 mg via INTRAVENOUS
  Filled 2019-03-18 (×5): qty 1

## 2019-03-18 MED ORDER — SODIUM CHLORIDE 0.9 % IV SOLN
INTRAVENOUS | Status: DC | PRN
  Administered 2019-03-18: 08:00:00

## 2019-03-18 MED ORDER — FENTANYL CITRATE (PF) 100 MCG/2ML IJ SOLN
25.0000 ug | INTRAMUSCULAR | Status: DC | PRN
  Administered 2019-03-18 (×2): 50 ug via INTRAVENOUS

## 2019-03-18 MED ORDER — PANTOPRAZOLE SODIUM 40 MG IV SOLR
40.0000 mg | Freq: Every day | INTRAVENOUS | Status: DC
  Administered 2019-03-18 – 2019-03-19 (×2): 40 mg via INTRAVENOUS
  Filled 2019-03-18 (×2): qty 40

## 2019-03-18 MED ORDER — LIDOCAINE 2% (20 MG/ML) 5 ML SYRINGE
INTRAMUSCULAR | Status: AC
  Filled 2019-03-18: qty 5

## 2019-03-18 MED ORDER — SODIUM CHLORIDE 0.9 % IV SOLN
INTRAVENOUS | Status: DC | PRN
  Administered 2019-03-18: 08:00:00 via INTRAVENOUS

## 2019-03-18 MED ORDER — SODIUM CHLORIDE 0.9 % IV SOLN: INTRAVENOUS | Status: DC

## 2019-03-18 MED ORDER — THROMBIN 20000 UNITS EX SOLR
CUTANEOUS | Status: DC | PRN
  Administered 2019-03-18: 10:00:00 via TOPICAL

## 2019-03-18 MED ORDER — PROMETHAZINE HCL 25 MG/ML IJ SOLN
12.5000 mg | Freq: Four times a day (QID) | INTRAMUSCULAR | Status: DC | PRN
  Administered 2019-03-18: 12.5 mg via INTRAVENOUS
  Filled 2019-03-18 (×2): qty 1

## 2019-03-18 MED ORDER — CITALOPRAM HYDROBROMIDE 20 MG PO TABS
20.0000 mg | ORAL_TABLET | Freq: Every day | ORAL | Status: DC
  Administered 2019-03-18 – 2019-03-20 (×3): 20 mg via ORAL
  Filled 2019-03-18: qty 1
  Filled 2019-03-18 (×2): qty 2

## 2019-03-18 MED ORDER — FENTANYL CITRATE (PF) 250 MCG/5ML IJ SOLN
INTRAMUSCULAR | Status: DC | PRN
  Administered 2019-03-18: 50 ug via INTRAVENOUS

## 2019-03-18 MED ORDER — CHLORHEXIDINE GLUCONATE CLOTH 2 % EX PADS: 6.0000 | MEDICATED_PAD | Freq: Once | CUTANEOUS | Status: DC

## 2019-03-18 MED ORDER — ACETAMINOPHEN 650 MG RE SUPP: 650.0000 mg | RECTAL | Status: DC | PRN

## 2019-03-18 MED ORDER — ONDANSETRON HCL 4 MG PO TABS: 4.0000 mg | ORAL_TABLET | ORAL | Status: DC | PRN

## 2019-03-18 MED ORDER — PROMETHAZINE HCL 25 MG PO TABS: 12.5000 mg | ORAL_TABLET | ORAL | Status: DC | PRN

## 2019-03-18 MED ORDER — ROCURONIUM BROMIDE 10 MG/ML (PF) SYRINGE
PREFILLED_SYRINGE | INTRAVENOUS | Status: DC | PRN
  Administered 2019-03-18: 10 mg via INTRAVENOUS
  Administered 2019-03-18 (×2): 20 mg via INTRAVENOUS
  Administered 2019-03-18: 50 mg via INTRAVENOUS

## 2019-03-18 MED ORDER — ARTIFICIAL TEARS OPHTHALMIC OINT
TOPICAL_OINTMENT | OPHTHALMIC | Status: AC
  Filled 2019-03-18: qty 3.5

## 2019-03-18 MED ORDER — DOCUSATE SODIUM 100 MG PO CAPS
100.0000 mg | ORAL_CAPSULE | Freq: Two times a day (BID) | ORAL | Status: DC
  Administered 2019-03-18 – 2019-03-21 (×6): 100 mg via ORAL
  Filled 2019-03-18 (×6): qty 1

## 2019-03-18 MED ORDER — SENNA 8.6 MG PO TABS
1.0000 | ORAL_TABLET | Freq: Two times a day (BID) | ORAL | Status: DC
  Administered 2019-03-18 – 2019-03-21 (×6): 8.6 mg via ORAL
  Filled 2019-03-18 (×6): qty 1

## 2019-03-18 MED ORDER — LIDOCAINE 2% (20 MG/ML) 5 ML SYRINGE
INTRAMUSCULAR | Status: DC | PRN
  Administered 2019-03-18: 60 mg via INTRAVENOUS

## 2019-03-18 MED ORDER — SENNOSIDES-DOCUSATE SODIUM 8.6-50 MG PO TABS: 1.0000 | ORAL_TABLET | Freq: Every evening | ORAL | Status: DC | PRN

## 2019-03-18 MED ORDER — HYDROCODONE-ACETAMINOPHEN 5-325 MG PO TABS
1.0000 | ORAL_TABLET | ORAL | Status: DC | PRN
  Administered 2019-03-19 – 2019-03-20 (×2): 1 via ORAL
  Filled 2019-03-18 (×2): qty 1

## 2019-03-18 MED ORDER — PHENYLEPHRINE 40 MCG/ML (10ML) SYRINGE FOR IV PUSH (FOR BLOOD PRESSURE SUPPORT)
PREFILLED_SYRINGE | INTRAVENOUS | Status: DC | PRN
  Administered 2019-03-18: 40 ug via INTRAVENOUS

## 2019-03-18 MED ORDER — ATORVASTATIN CALCIUM 40 MG PO TABS
40.0000 mg | ORAL_TABLET | Freq: Every day | ORAL | Status: DC
  Administered 2019-03-19 – 2019-03-20 (×2): 40 mg via ORAL
  Filled 2019-03-18 (×2): qty 1

## 2019-03-18 MED ORDER — BACITRACIN ZINC 500 UNIT/GM EX OINT
TOPICAL_OINTMENT | CUTANEOUS | Status: AC
  Filled 2019-03-18: qty 28.35

## 2019-03-18 MED ORDER — FENTANYL CITRATE (PF) 250 MCG/5ML IJ SOLN
INTRAMUSCULAR | Status: AC
  Filled 2019-03-18: qty 5

## 2019-03-18 MED ORDER — ONDANSETRON HCL 4 MG/2ML IJ SOLN
INTRAMUSCULAR | Status: AC
  Filled 2019-03-18: qty 2

## 2019-03-18 MED ORDER — CEFAZOLIN SODIUM-DEXTROSE 2-4 GM/100ML-% IV SOLN
2.0000 g | INTRAVENOUS | Status: AC
  Administered 2019-03-18: 2 g via INTRAVENOUS
  Filled 2019-03-18: qty 100

## 2019-03-18 MED ORDER — FLUTICASONE PROPIONATE 50 MCG/ACT NA SUSP
2.0000 | Freq: Every day | NASAL | Status: DC
  Administered 2019-03-18 – 2019-03-21 (×3): 2 via NASAL
  Filled 2019-03-18: qty 16

## 2019-03-18 MED ORDER — LIDOCAINE-EPINEPHRINE 1 %-1:100000 IJ SOLN
INTRAMUSCULAR | Status: AC
  Filled 2019-03-18: qty 1

## 2019-03-18 MED ORDER — PROMETHAZINE HCL 25 MG/ML IJ SOLN
INTRAMUSCULAR | Status: AC
  Administered 2019-03-18: 6.25 mg
  Filled 2019-03-18: qty 1

## 2019-03-18 MED ORDER — ONDANSETRON HCL 4 MG/2ML IJ SOLN: 4.0000 mg | INTRAMUSCULAR | Status: DC | PRN

## 2019-03-18 MED ORDER — ESMOLOL HCL 100 MG/10ML IV SOLN
INTRAVENOUS | Status: AC
  Filled 2019-03-18: qty 10

## 2019-03-18 MED ORDER — ADULT MULTIVITAMIN W/MINERALS CH
1.0000 | ORAL_TABLET | Freq: Every day | ORAL | Status: DC
  Administered 2019-03-18 – 2019-03-21 (×4): 1 via ORAL
  Filled 2019-03-18 (×4): qty 1

## 2019-03-18 MED ORDER — PANTOPRAZOLE SODIUM 40 MG IV SOLR: 40.0000 mg | Freq: Every day | INTRAVENOUS | Status: DC

## 2019-03-18 MED ORDER — LIDOCAINE-EPINEPHRINE 1 %-1:100000 IJ SOLN
INTRAMUSCULAR | Status: DC | PRN
  Administered 2019-03-18: 13 mL

## 2019-03-18 MED ORDER — HEMOSTATIC AGENTS (NO CHARGE) OPTIME
TOPICAL | Status: DC | PRN
  Administered 2019-03-18: 1 via TOPICAL

## 2019-03-18 MED ORDER — DEXAMETHASONE SODIUM PHOSPHATE 4 MG/ML IJ SOLN
4.0000 mg | Freq: Three times a day (TID) | INTRAMUSCULAR | Status: DC
  Administered 2019-03-20 – 2019-03-21 (×2): 4 mg via INTRAVENOUS
  Filled 2019-03-18 (×2): qty 1

## 2019-03-18 MED ORDER — DEXAMETHASONE SODIUM PHOSPHATE 10 MG/ML IJ SOLN
6.0000 mg | Freq: Four times a day (QID) | INTRAMUSCULAR | Status: AC
  Administered 2019-03-18 – 2019-03-19 (×4): 6 mg via INTRAVENOUS
  Filled 2019-03-18 (×3): qty 1

## 2019-03-18 MED ORDER — OXYCODONE HCL 5 MG PO TABS: 5.0000 mg | ORAL_TABLET | ORAL | Status: DC | PRN

## 2019-03-18 MED ORDER — FLEET ENEMA 7-19 GM/118ML RE ENEM: 1.0000 | ENEMA | Freq: Once | RECTAL | Status: DC | PRN

## 2019-03-18 MED ORDER — SODIUM CHLORIDE 0.9 % IV SOLN
INTRAVENOUS | Status: DC
  Administered 2019-03-18 – 2019-03-20 (×4): via INTRAVENOUS

## 2019-03-18 MED ORDER — THROMBIN 5000 UNITS EX SOLR
OROMUCOSAL | Status: DC | PRN
  Administered 2019-03-18: 09:00:00 via TOPICAL

## 2019-03-18 MED ORDER — THROMBIN 5000 UNITS EX SOLR
CUTANEOUS | Status: AC
  Filled 2019-03-18: qty 5000

## 2019-03-18 MED ORDER — FERROUS SULFATE 325 (65 FE) MG PO TABS
325.0000 mg | ORAL_TABLET | Freq: Every day | ORAL | Status: DC
  Administered 2019-03-19 – 2019-03-21 (×3): 325 mg via ORAL
  Filled 2019-03-18 (×3): qty 1

## 2019-03-18 MED ORDER — PANTOPRAZOLE SODIUM 40 MG PO TBEC: 40.0000 mg | DELAYED_RELEASE_TABLET | Freq: Every day | ORAL | Status: DC

## 2019-03-18 MED ORDER — SODIUM CHLORIDE 0.9 % IV SOLN
INTRAVENOUS | Status: DC | PRN
  Administered 2019-03-18: 25 ug/min via INTRAVENOUS

## 2019-03-18 MED ORDER — LEVETIRACETAM IN NACL 1000 MG/100ML IV SOLN
1000.0000 mg | Freq: Once | INTRAVENOUS | Status: AC
  Administered 2019-03-18: 1000 mg via INTRAVENOUS
  Filled 2019-03-18: qty 100

## 2019-03-18 MED ORDER — CEFAZOLIN SODIUM-DEXTROSE 1-4 GM/50ML-% IV SOLN: 1.0000 g | Freq: Three times a day (TID) | INTRAVENOUS | Status: DC

## 2019-03-18 MED ORDER — ACETAMINOPHEN 500 MG PO TABS
1000.0000 mg | ORAL_TABLET | Freq: Once | ORAL | Status: AC
  Administered 2019-03-18: 1000 mg via ORAL
  Filled 2019-03-18: qty 2

## 2019-03-18 MED ORDER — HYDROCODONE-ACETAMINOPHEN 5-325 MG PO TABS: 1.0000 | ORAL_TABLET | ORAL | Status: DC | PRN

## 2019-03-18 MED ORDER — ACETAMINOPHEN 325 MG PO TABS
ORAL_TABLET | ORAL | Status: AC
  Filled 2019-03-18: qty 2

## 2019-03-18 MED ORDER — ONDANSETRON HCL 4 MG/2ML IJ SOLN: 4.0000 mg | Freq: Once | INTRAMUSCULAR | Status: DC | PRN

## 2019-03-18 MED ORDER — PROPOFOL 10 MG/ML IV BOLUS
INTRAVENOUS | Status: AC
  Filled 2019-03-18: qty 40

## 2019-03-18 MED ORDER — DOCUSATE SODIUM 100 MG PO CAPS: 100.0000 mg | ORAL_CAPSULE | Freq: Two times a day (BID) | ORAL | Status: DC

## 2019-03-18 MED ORDER — ONDANSETRON HCL 4 MG PO TABS
4.0000 mg | ORAL_TABLET | ORAL | Status: DC | PRN
  Administered 2019-03-19: 4 mg via ORAL
  Filled 2019-03-18: qty 1

## 2019-03-18 MED ORDER — KETOROLAC TROMETHAMINE 30 MG/ML IJ SOLN
INTRAMUSCULAR | Status: AC
  Filled 2019-03-18: qty 1

## 2019-03-18 MED ORDER — BACITRACIN ZINC 500 UNIT/GM EX OINT
TOPICAL_OINTMENT | CUTANEOUS | Status: DC | PRN
  Administered 2019-03-18: 1 via TOPICAL

## 2019-03-18 MED ORDER — GLYCOPYRROLATE PF 0.2 MG/ML IJ SOSY
PREFILLED_SYRINGE | INTRAMUSCULAR | Status: AC
  Filled 2019-03-18: qty 1

## 2019-03-18 MED ORDER — PHENYLEPHRINE 40 MCG/ML (10ML) SYRINGE FOR IV PUSH (FOR BLOOD PRESSURE SUPPORT)
PREFILLED_SYRINGE | INTRAVENOUS | Status: AC
  Filled 2019-03-18: qty 10

## 2019-03-18 MED ORDER — ACETAMINOPHEN 325 MG PO TABS
650.0000 mg | ORAL_TABLET | ORAL | Status: DC | PRN
  Administered 2019-03-18: 650 mg via ORAL

## 2019-03-18 MED ORDER — ALBUMIN HUMAN 5 % IV SOLN
INTRAVENOUS | Status: AC
  Administered 2019-03-18: 12.5 g via INTRAVENOUS
  Filled 2019-03-18: qty 250

## 2019-03-18 MED ORDER — LEVOTHYROXINE SODIUM 50 MCG PO TABS
50.0000 ug | ORAL_TABLET | Freq: Every day | ORAL | Status: DC
  Administered 2019-03-19 – 2019-03-21 (×3): 50 ug via ORAL
  Filled 2019-03-18 (×3): qty 1

## 2019-03-18 MED ORDER — ONDANSETRON HCL 4 MG/2ML IJ SOLN
4.0000 mg | INTRAMUSCULAR | Status: DC | PRN
  Administered 2019-03-19 – 2019-03-20 (×2): 4 mg via INTRAVENOUS
  Filled 2019-03-18 (×2): qty 2

## 2019-03-18 MED ORDER — DEXAMETHASONE SODIUM PHOSPHATE 10 MG/ML IJ SOLN
INTRAMUSCULAR | Status: DC | PRN
  Administered 2019-03-18: 10 mg via INTRAVENOUS

## 2019-03-18 MED ORDER — HYDROMORPHONE HCL 1 MG/ML IJ SOLN: 0.5000 mg | INTRAMUSCULAR | Status: DC | PRN

## 2019-03-18 MED ORDER — SODIUM CHLORIDE 0.9 % IV SOLN
0.0125 ug/kg/min | INTRAVENOUS | Status: DC
  Administered 2019-03-18: .4 ug/kg/min via INTRAVENOUS
  Filled 2019-03-18 (×2): qty 2000

## 2019-03-18 MED ORDER — LEVETIRACETAM IN NACL 500 MG/100ML IV SOLN: 500.0000 mg | Freq: Two times a day (BID) | INTRAVENOUS | Status: DC

## 2019-03-18 MED ORDER — BUPIVACAINE HCL (PF) 0.5 % IJ SOLN
INTRAMUSCULAR | Status: AC
  Filled 2019-03-18: qty 30

## 2019-03-18 MED ORDER — LEVETIRACETAM IN NACL 500 MG/100ML IV SOLN
500.0000 mg | Freq: Two times a day (BID) | INTRAVENOUS | Status: DC
  Administered 2019-03-18 – 2019-03-21 (×6): 500 mg via INTRAVENOUS
  Filled 2019-03-18 (×6): qty 100

## 2019-03-18 MED ORDER — BISACODYL 10 MG RE SUPP
10.0000 mg | Freq: Every day | RECTAL | Status: DC | PRN
  Administered 2019-03-20: 10 mg via RECTAL
  Filled 2019-03-18: qty 1

## 2019-03-18 MED ORDER — BISACODYL 5 MG PO TBEC: 5.0000 mg | DELAYED_RELEASE_TABLET | Freq: Every day | ORAL | Status: DC | PRN

## 2019-03-18 MED ORDER — ALPRAZOLAM 0.25 MG PO TABS
0.2500 mg | ORAL_TABLET | Freq: Two times a day (BID) | ORAL | Status: DC | PRN
  Administered 2019-03-18 – 2019-03-20 (×3): 0.25 mg via ORAL
  Filled 2019-03-18 (×3): qty 1

## 2019-03-18 MED ORDER — 0.9 % SODIUM CHLORIDE (POUR BTL) OPTIME
TOPICAL | Status: DC | PRN
  Administered 2019-03-18 (×3): 1000 mL

## 2019-03-18 MED ORDER — PHENYLEPHRINE 40 MCG/ML (10ML) SYRINGE FOR IV PUSH (FOR BLOOD PRESSURE SUPPORT)
PREFILLED_SYRINGE | INTRAVENOUS | Status: AC
  Filled 2019-03-18: qty 40

## 2019-03-18 MED ORDER — HYDRALAZINE HCL 20 MG/ML IJ SOLN: 5.0000 mg | INTRAMUSCULAR | Status: DC | PRN

## 2019-03-18 MED ORDER — SUGAMMADEX SODIUM 200 MG/2ML IV SOLN
INTRAVENOUS | Status: DC | PRN
  Administered 2019-03-18: 120 mg via INTRAVENOUS

## 2019-03-18 MED ORDER — MORPHINE SULFATE (PF) 2 MG/ML IV SOLN: 1.0000 mg | INTRAVENOUS | Status: DC | PRN

## 2019-03-18 MED ORDER — SUCCINYLCHOLINE CHLORIDE 200 MG/10ML IV SOSY
PREFILLED_SYRINGE | INTRAVENOUS | Status: AC
  Filled 2019-03-18: qty 10

## 2019-03-18 MED ORDER — ALBUMIN HUMAN 5 % IV SOLN
12.5000 g | Freq: Once | INTRAVENOUS | Status: AC
  Administered 2019-03-18: 15:00:00 12.5 g via INTRAVENOUS

## 2019-03-18 MED ORDER — HEPARIN SODIUM (PORCINE) 5000 UNIT/ML IJ SOLN
5000.0000 [IU] | Freq: Three times a day (TID) | INTRAMUSCULAR | Status: DC
  Administered 2019-03-19 – 2019-03-21 (×6): 5000 [IU] via SUBCUTANEOUS
  Filled 2019-03-18 (×6): qty 1

## 2019-03-18 MED ORDER — ROCURONIUM BROMIDE 10 MG/ML (PF) SYRINGE
PREFILLED_SYRINGE | INTRAVENOUS | Status: AC
  Filled 2019-03-18: qty 20

## 2019-03-18 MED ORDER — ONDANSETRON HCL 4 MG/2ML IJ SOLN
INTRAMUSCULAR | Status: DC | PRN
  Administered 2019-03-18: 4 mg via INTRAVENOUS

## 2019-03-18 MED ORDER — SUCCINYLCHOLINE CHLORIDE 200 MG/10ML IV SOSY
PREFILLED_SYRINGE | INTRAVENOUS | Status: DC | PRN
  Administered 2019-03-18: 80 mg via INTRAVENOUS

## 2019-03-18 MED ORDER — UMECLIDINIUM-VILANTEROL 62.5-25 MCG/INH IN AEPB
1.0000 | INHALATION_SPRAY | Freq: Every day | RESPIRATORY_TRACT | Status: DC
  Administered 2019-03-18 – 2019-03-21 (×4): 1 via RESPIRATORY_TRACT
  Filled 2019-03-18: qty 14

## 2019-03-18 MED ORDER — ACETAMINOPHEN 325 MG PO TABS: 650.0000 mg | ORAL_TABLET | ORAL | Status: DC | PRN

## 2019-03-18 MED ORDER — ONDANSETRON 4 MG PO TBDP
4.0000 mg | ORAL_TABLET | ORAL | Status: DC | PRN
  Filled 2019-03-18: qty 1

## 2019-03-18 MED ORDER — NALOXONE HCL 0.4 MG/ML IJ SOLN: 0.0800 mg | INTRAMUSCULAR | Status: DC | PRN

## 2019-03-18 MED ORDER — THROMBIN 20000 UNITS EX SOLR
CUTANEOUS | Status: AC
  Filled 2019-03-18: qty 20000

## 2019-03-18 MED ORDER — SODIUM CHLORIDE 0.9 % IV SOLN
INTRAVENOUS | Status: DC
  Administered 2019-03-18: 08:00:00 via INTRAVENOUS

## 2019-03-18 MED ORDER — BUPIVACAINE HCL (PF) 0.5 % IJ SOLN
INTRAMUSCULAR | Status: DC | PRN
  Administered 2019-03-18: 13 mL

## 2019-03-18 SURGICAL SUPPLY — 107 items
APL SKNCLS STERI-STRIP NONHPOA (GAUZE/BANDAGES/DRESSINGS)
BENZOIN TINCTURE PRP APPL 2/3 (GAUZE/BANDAGES/DRESSINGS) IMPLANT
BLADE CLIPPER SURG (BLADE) ×3 IMPLANT
BLADE SAW GIGLI 16 STRL (MISCELLANEOUS) IMPLANT
BLADE SURG 15 STRL LF DISP TIS (BLADE) IMPLANT
BLADE SURG 15 STRL SS (BLADE)
BLADE ULTRA TIP 2M (BLADE) ×3 IMPLANT
BNDG CMPR 75X41 PLY HI ABS (GAUZE/BANDAGES/DRESSINGS)
BNDG GAUZE ELAST 4 BULKY (GAUZE/BANDAGES/DRESSINGS) IMPLANT
BNDG STRETCH 4X75 STRL LF (GAUZE/BANDAGES/DRESSINGS) IMPLANT
BUR ACORN 6.0 PRECISION (BURR) ×3 IMPLANT
BUR ROUND FLUTED 4 SOFT TCH (BURR) IMPLANT
BUR SPIRAL ROUTER 2.3 (BUR) ×3 IMPLANT
CANISTER SUCT 3000ML PPV (MISCELLANEOUS) ×6 IMPLANT
CARTRIDGE OIL MAESTRO DRILL (MISCELLANEOUS) ×2 IMPLANT
CATH VENTRIC 35X38 W/TROCAR LG (CATHETERS) IMPLANT
CLIP VESOCCLUDE MED 6/CT (CLIP) IMPLANT
CONT SPEC 4OZ CLIKSEAL STRL BL (MISCELLANEOUS) ×3 IMPLANT
COVER MAYO STAND STRL (DRAPES) IMPLANT
COVER WAND RF STERILE (DRAPES) ×3 IMPLANT
DECANTER SPIKE VIAL GLASS SM (MISCELLANEOUS) ×3 IMPLANT
DIFFUSER DRILL AIR PNEUMATIC (MISCELLANEOUS) ×3 IMPLANT
DRAIN SUBARACHNOID (WOUND CARE) IMPLANT
DRAPE HALF SHEET 40X57 (DRAPES) ×3 IMPLANT
DRAPE MICROSCOPE LEICA (MISCELLANEOUS) IMPLANT
DRAPE NEUROLOGICAL W/INCISE (DRAPES) ×3 IMPLANT
DRAPE STERI IOBAN 125X83 (DRAPES) IMPLANT
DRAPE SURG 17X23 STRL (DRAPES) IMPLANT
DRAPE WARM FLUID 44X44 (DRAPES) ×3 IMPLANT
DRSG ADAPTIC 3X8 NADH LF (GAUZE/BANDAGES/DRESSINGS) IMPLANT
DRSG TELFA 3X8 NADH (GAUZE/BANDAGES/DRESSINGS) ×3 IMPLANT
DURAMATRIX ONLAY 3X3 (Plate) ×1 IMPLANT
DURAPREP 6ML APPLICATOR 50/CS (WOUND CARE) ×3 IMPLANT
ELECT REM PT RETURN 9FT ADLT (ELECTROSURGICAL) ×3
ELECTRODE REM PT RTRN 9FT ADLT (ELECTROSURGICAL) ×2 IMPLANT
EVACUATOR 1/8 PVC DRAIN (DRAIN) IMPLANT
EVACUATOR SILICONE 100CC (DRAIN) IMPLANT
FORCEPS BIPOLAR SPETZLER 8 1.0 (NEUROSURGERY SUPPLIES) ×4 IMPLANT
GAUZE 4X4 16PLY RFD (DISPOSABLE) IMPLANT
GAUZE SPONGE 4X4 12PLY STRL (GAUZE/BANDAGES/DRESSINGS) ×3 IMPLANT
GLOVE BIO SURGEON STRL SZ7 (GLOVE) IMPLANT
GLOVE BIOGEL PI IND STRL 7.0 (GLOVE) IMPLANT
GLOVE BIOGEL PI IND STRL 7.5 (GLOVE) ×2 IMPLANT
GLOVE BIOGEL PI INDICATOR 7.0 (GLOVE) ×4
GLOVE BIOGEL PI INDICATOR 7.5 (GLOVE) ×1
GLOVE ECLIPSE 7.0 STRL STRAW (GLOVE) ×6 IMPLANT
GLOVE ECLIPSE 7.5 STRL STRAW (GLOVE) ×1 IMPLANT
GLOVE EXAM NITRILE XL STR (GLOVE) IMPLANT
GLOVE SURG SS PI 7.5 STRL IVOR (GLOVE) ×3 IMPLANT
GOWN STRL REUS W/ TWL LRG LVL3 (GOWN DISPOSABLE) ×4 IMPLANT
GOWN STRL REUS W/ TWL XL LVL3 (GOWN DISPOSABLE) IMPLANT
GOWN STRL REUS W/TWL 2XL LVL3 (GOWN DISPOSABLE) IMPLANT
GOWN STRL REUS W/TWL LRG LVL3 (GOWN DISPOSABLE) ×3
GOWN STRL REUS W/TWL XL LVL3 (GOWN DISPOSABLE) ×6
HEMOSTAT POWDER KIT SURGIFOAM (HEMOSTASIS) ×3 IMPLANT
HEMOSTAT SURGICEL 2X14 (HEMOSTASIS) ×3 IMPLANT
HOOK DURA 1/2IN (MISCELLANEOUS) ×3 IMPLANT
IV NS 1000ML (IV SOLUTION) ×3
IV NS 1000ML BAXH (IV SOLUTION) ×2 IMPLANT
KIT BASIN OR (CUSTOM PROCEDURE TRAY) ×3 IMPLANT
KIT DRAIN CSF ACCUDRAIN (MISCELLANEOUS) IMPLANT
KIT TURNOVER KIT B (KITS) ×3 IMPLANT
KNIFE ARACHNOID DISP AM-24-S (MISCELLANEOUS) ×3 IMPLANT
MARKER SPHERE PSV REFLC 13MM (MARKER) ×7 IMPLANT
NDL SPNL 18GX3.5 QUINCKE PK (NEEDLE) IMPLANT
NEEDLE HYPO 22GX1.5 SAFETY (NEEDLE) ×3 IMPLANT
NEEDLE SPNL 18GX3.5 QUINCKE PK (NEEDLE) IMPLANT
NS IRRIG 1000ML POUR BTL (IV SOLUTION) ×9 IMPLANT
OIL CARTRIDGE MAESTRO DRILL (MISCELLANEOUS) ×3
PACK CRANIOTOMY CUSTOM (CUSTOM PROCEDURE TRAY) ×3 IMPLANT
PAD DRESSING TELFA 3X8 NADH (GAUZE/BANDAGES/DRESSINGS) IMPLANT
PATTIES SURGICAL .25X.25 (GAUZE/BANDAGES/DRESSINGS) IMPLANT
PATTIES SURGICAL .5 X.5 (GAUZE/BANDAGES/DRESSINGS) IMPLANT
PATTIES SURGICAL .5 X3 (DISPOSABLE) IMPLANT
PATTIES SURGICAL 1/4 X 3 (GAUZE/BANDAGES/DRESSINGS) IMPLANT
PATTIES SURGICAL 1X1 (DISPOSABLE) IMPLANT
PIN MAYFIELD SKULL DISP (PIN) ×3 IMPLANT
PLATE 1.5/0.5 18.5MM BURR HOLE (Plate) ×3 IMPLANT
RUBBERBAND STERILE (MISCELLANEOUS) ×2 IMPLANT
SCREW SELF DRILL HT 1.5/4MM (Screw) ×12 IMPLANT
SET CARTRIDGE AND TUBING (SET/KITS/TRAYS/PACK) ×1 IMPLANT
SET TUBING W/EXT DISP (INSTRUMENTS) ×3 IMPLANT
SPECIMEN JAR SMALL (MISCELLANEOUS) IMPLANT
SPONGE NEURO XRAY DETECT 1X3 (DISPOSABLE) IMPLANT
SPONGE SURGIFOAM ABS GEL 100 (HEMOSTASIS) ×4 IMPLANT
STAPLER VISISTAT 35W (STAPLE) ×3 IMPLANT
STOCKINETTE 6  STRL (DRAPES)
STOCKINETTE 6 STRL (DRAPES) IMPLANT
SUT ETHILON 3 0 FSL (SUTURE) IMPLANT
SUT ETHILON 3 0 PS 1 (SUTURE) IMPLANT
SUT NURALON 4 0 TR CR/8 (SUTURE) ×8 IMPLANT
SUT SILK 0 TIES 10X30 (SUTURE) IMPLANT
SUT VIC AB 0 CT1 18XCR BRD8 (SUTURE) ×4 IMPLANT
SUT VIC AB 0 CT1 8-18 (SUTURE) ×6
SUT VIC AB 3-0 SH 8-18 (SUTURE) ×6 IMPLANT
TAPE CLOTH 1X10 TAN NS (GAUZE/BANDAGES/DRESSINGS) ×3 IMPLANT
TIP STANDARD 36KHZ (INSTRUMENTS) ×3
TIP STD 36KHZ (INSTRUMENTS) IMPLANT
TIP STRAIGHT 25KHZ (INSTRUMENTS) ×3 IMPLANT
TOWEL GREEN STERILE (TOWEL DISPOSABLE) ×3 IMPLANT
TOWEL GREEN STERILE FF (TOWEL DISPOSABLE) ×3 IMPLANT
TRAY FOLEY MTR SLVR 14FR STAT (SET/KITS/TRAYS/PACK) ×1 IMPLANT
TRAY FOLEY MTR SLVR 16FR STAT (SET/KITS/TRAYS/PACK) ×2 IMPLANT
TUBE CONNECTING 12X1/4 (SUCTIONS) ×3 IMPLANT
UNDERPAD 30X30 (UNDERPADS AND DIAPERS) ×3 IMPLANT
WATER STERILE IRR 1000ML POUR (IV SOLUTION) ×3 IMPLANT
WRENCH TORQUE 36KHZ (INSTRUMENTS) ×1 IMPLANT

## 2019-03-18 NOTE — Transfer of Care (Signed)
Immediate Anesthesia Transfer of Care Note  Patient: [name]  Procedure(s) Performed: Left stereotactic craniotomy for tumor resection (Left ) APPLICATION OF CRANIAL NAVIGATION (N/A )  Patient Location: PACU  Anesthesia Type:General  Level of Consciousness: awake, alert  and oriented  Airway & Oxygen Therapy: Patient Spontanous Breathing and Patient connected to nasal cannula oxygen  Post-op Assessment: Report given to RN and Post -op Vital signs reviewed and stable  Post vital signs: Reviewed and stable  Last Vitals:  Vitals Value Taken Time  BP 117/63 03/18/2019 12:01 PM  Temp    Pulse 90 03/18/2019 12:02 PM  Resp 19 03/18/2019 12:02 PM  SpO2 97 % 03/18/2019 12:02 PM  Vitals shown include unvalidated device data.  Last Pain:  Vitals:   03/18/19 0704  TempSrc: Oral         Complications: No apparent anesthesia complications

## 2019-03-18 NOTE — Anesthesia Procedure Notes (Signed)
Arterial Line Insertion Start/End6/07/2019 8:00 AM, 03/18/2019 8:04 AM Performed by: Renato Shin, CRNA, CRNA  Preanesthetic checklist: patient identified, IV checked, site marked, risks and benefits discussed, surgical consent, monitors and equipment checked, pre-op evaluation, timeout performed and anesthesia consent Lidocaine 1% used for infiltration and patient sedated Right, radial was placed Catheter size: 20 G Hand hygiene performed  and maximum sterile barriers used  Allen's test indicative of satisfactory collateral circulation Attempts: 1 Procedure performed without using ultrasound guided technique. Following insertion, Biopatch and dressing applied. Post procedure assessment: normal  Patient tolerated the procedure well with no immediate complications.

## 2019-03-18 NOTE — H&P (Signed)
Chief Complaint  Brain Tumor  History of Present Illness  [name] is a 78 y.o. female with a history of lung CA who recently presented with headache and speech difficulty.  Work-up included CT scan which demonstrated a left parietal occipital intraparenchymal hemorrhage.  With her history, further work-up included MRI scan which demonstrated for subcentimeter enhancing lesions consistent with metastatic disease, as well as peripherally enhancing lesion underlying the left parietal hematoma, much larger measuring nearly 4 cm.  After discussion at the multidisciplinary neuro-oncology conference, primary stereotactic radiosurgery was recommended for the for small lesions, with preoperative SRS followed by surgical resection for the larger left parietal lesion.  The patient underwent the stereotactic radiosurgery yesterday uneventfully.  She has no new complaints today.  She does report significant improvement in her speech, with only mild occasional word finding difficulty primarily when she is fatigued.  Past Medical History   Past Medical History:  Diagnosis Date  . 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 Allergic rhinitis   . 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 Anxiety disorder   . Aortic aneurysm ([place])   . Complication of anesthesia 2011   bloodpressure dropped during colonoscopy, not problems since  . Compression fracture   . COPD (chronic obstructive pulmonary disease) ([place])   . Depression   . Diastolic dysfunction   . Diverticulosis   . Dysphagia   . Emphysema lung ([place]) 11/08/2014  . Headache   . Hiatal hernia   . History of kidney stones   . Hypothyroidism   . IBS (irritable bowel syndrome)   . Iron deficiency anemia due to chronic blood loss 12/05/2016  . On home O2 12/15/2013   chronic hypoxia  . Pneumonia   . PUD (peptic ulcer disease)    63yrs  . Shortness of breath    with exertion  . SIADH (syndrome of  inappropriate ADH production) ([place])   . Trigeminal neuralgia     Past Surgical History   Past Surgical History:  Procedure Laterality Date  . ABDOMINAL HYSTERECTOMY    . BACK SURGERY  January 13, 2014  . CATARACT EXTRACTION    . CATARACT EXTRACTION W/PHACO  09/02/2012   Procedure: CATARACT EXTRACTION PHACO AND INTRAOCULAR LENS PLACEMENT (IOC);  Surgeon: Elta Guadeloupe T. Gershon Crane, MD;  Location: [facility];  Service: Ophthalmology;  Laterality: Left;  CDE:10.35  . CHOLECYSTECTOMY    . 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 COLONOSCOPY N/A 11/23/2014   Dr. Oneida Alar: moderate diverticulosis, hemorrhoids, redundant colon. next TCS in 10-15 years.   Marland Kitchen ENDARTERECTOMY Right 08/21/2013   Procedure: RIGHT CAROTID ANEURYSM RESECTION;  Surgeon: Rosetta Posner, MD;  Location: [place];  Service: Vascular;  Laterality: Right;  . ESOPHAGEAL DILATION  02/24/2018   Procedure: ESOPHAGEAL DILATION;  Surgeon: Danie Binder, MD;  [facility];  Service: Endoscopy;;  . ESOPHAGOGASTRODUODENOSCOPY  11/13/2009   w/dilation to 4mm, gastric ulceration (H.Pylori) s/p treatment  . ESOPHAGOGASTRODUODENOSCOPY  11/21/2009   distal esophageal web, gastritis  . ESOPHAGOGASTRODUODENOSCOPY  03/14/12   BZJ:IRCVELFYB in the distal esophagus/Mild gastritis/small HH. + H.pylori, prescribed Pylera. Finished treatment.   . ESOPHAGOGASTRODUODENOSCOPY N/A 12/13/2017   Procedure: ESOPHAGOGASTRODUODENOSCOPY (EGD);  Surgeon: Danie Binder, MD;  [facility];  Service: Endoscopy;  Laterality: N/A;  8:30am  . ESOPHAGOGASTRODUODENOSCOPY N/A 02/24/2018   Procedure: ESOPHAGOGASTRODUODENOSCOPY (EGD);  Surgeon: Danie Binder, MD;  [facility];  Service: Endoscopy;  Laterality: N/A;  11:00am  . fatty tumor removal from lt groin    . FRACTURE SURGERY Left    hand and left ring finger  . GIVENS CAPSULE STUDY N/A 12/26/2017   Procedure: GIVENS CAPSULE STUDY;  Surgeon: Danie Binder, MD;   [facility];  Service: Endoscopy;  Laterality: N/A;  7:30am  . GIVENS CAPSULE STUDY N/A 02/24/2018   Procedure: GIVENS CAPSULE STUDY;  Surgeon: Danie Binder, MD;  [facility];  Service: Endoscopy;  Laterality: N/A;  . KNEE SURGERY     patella tendon repair june 2019 [facility]  . LUNG CANCER SURGERY  12/2010   Left VATS, minithoracotomy, LLL superior segmentectomy  . ORIF PATELLA Right 03/18/2018   Procedure: OPEN REDUCTION INTERNAL (ORIF) FIXATION RIGHT PATELLA;  Surgeon: Carole Civil, MD;  Location: [facility];  Service: Orthopedics;  Laterality: Right;  . ORIF WRIST FRACTURE Left 04/09/2014   Procedure: OPEN REDUCTION INTERNAL FIXATION (ORIF) WRIST FRACTURE;  Surgeon: Renette Butters, MD;  Location: [place];  Service: Orthopedics;  Laterality: Left;  . PERCUTANEOUS PINNING Left 04/09/2014   Procedure: PERCUTANEOUS PINNING EXTREMITY;  Surgeon: Renette Butters, MD;  Location: [place];  Service: Orthopedics;  Laterality: Left;  . SAVORY DILATION N/A 12/13/2017   Procedure: SAVORY DILATION;  Surgeon: Danie Binder, MD;  [facility];  Service: Endoscopy;  Laterality: N/A;  . TUBAL LIGATION    . vocal cord surgery  02/06/2011   laryngoscopy with bilateral vocal cord Radiesse injection for vocal cord paralysis  . YAG LASER APPLICATION Left 63/78/5885   Procedure: YAG LASER APPLICATION;  Surgeon: Rutherford Guys, MD;  Location: [facility];  Service: Ophthalmology;  Laterality: Left;    Social History   Social History   Tobacco Use  . Smoking status: Former Smoker    Packs/day: 0.50    Years: 20.00    Pack years: 10.00    Types: Cigarettes    Last attempt to quit: 12/21/2010    Years since quitting: 8.2  . Smokeless tobacco: Never Used  . Tobacco comment: smoking cessation info given and reviewed   Substance Use Topics  . Alcohol use: Yes    Alcohol/week: 0.0 standard drinks    Comment: seldom  . Drug use: No    Medications   Prior to Admission medications    Medication Sig Start Date End Date Taking? Authorizing Provider  acetaminophen (TYLENOL) 325 MG tablet Take 650 mg by mouth every 6 (six) hours as needed for moderate pain or headache.    Yes [provider]  ALPRAZolam (XANAX) 0.25 MG tablet Take 1 tablet (0.25 mg total) by mouth 2 (two) times daily as needed for anxiety. 03/12/19  Yes Donzetta Starch, NP  atorvastatin (LIPITOR) 40 MG tablet Take 1 tablet (40 mg total) by mouth daily at 6 PM. 03/12/19  Yes Biby, Massie Kluver, NP  citalopram (CELEXA) 40 MG tablet Take 0.5 tablets (20 mg total) by mouth at bedtime. 03/12/19  Yes Donzetta Starch, NP  ferrous sulfate 325 (65 FE) MG tablet Take 325 mg by mouth daily with breakfast.   Yes [provider]  fluticasone (FLONASE) 50 MCG/ACT nasal spray Place 2 sprays into both nostrils daily. 12/30/18  Yes [name], MD  levothyroxine (SYNTHROID) 50 MCG tablet Take 1 tablet (50 mcg total) by mouth daily before breakfast. 03/12/19  Yes Donzetta Starch, NP  Multiple Vitamin (MULTIVITAMIN WITH  MINERALS) TABS tablet Take 1 tablet by mouth daily.   Yes [provider]  ondansetron (ZOFRAN ODT) 4 MG disintegrating tablet Take 1 tablet (4 mg total) by mouth every 4 (four) hours as needed for nausea or vomiting. 03/12/19  Yes Donzetta Starch, NP  OXYGEN Inhale 2 L into the lungs continuous.   Yes [provider]  pantoprazole (PROTONIX) 40 MG tablet Take 1 tablet (40 mg total) by mouth daily before breakfast. 02/05/19  Yes Annitta Needs, NP  senna-docusate (SENOKOT-S) 8.6-50 MG tablet Take 1 tablet by mouth at bedtime as needed for mild constipation. 03/12/19  Yes Donzetta Starch, NP  umeclidinium-vilanterol (ANORO ELLIPTA) 62.5-25 MCG/INH AEPB Inhale 1 puff into the lungs daily. 06/20/18  Yes [name], MD  denosumab (PROLIA) 60 MG/ML SOSY injection Inject 60 mg into the skin every 6 (six) months. 12/30/18   [name], MD    Allergies   Allergies  Allergen Reactions  .  Ciprofloxacin Hcl Hives  . Sulfonamide Derivatives Hives and Rash  . Iohexol Other (See Comments)    UNSPECIFIED REACTION  Desc: Per [facility], pt is allergic to IV contrast, no type of reaction was available. Pt cannot remember but first reacted around 15 yrs ago from an IVP. Notes were date from 2009 at urology center., Onset Date: [id number]   . Prozac [Fluoxetine Hcl] Rash    Review of Systems  ROS  Neurologic Exam  Awake, alert, oriented Memory and concentration grossly intact Speech fluent, appropriate CN grossly intact Motor exam: Upper Extremities Deltoid Bicep Tricep Grip  Right 5/5 5/5 5/5 5/5  Left 5/5 5/5 5/5 5/5   Lower Extremities IP Quad PF DF EHL  Right 5/5 5/5 5/5 5/5 5/5  Left 5/5 5/5 5/5 5/5 5/5   Sensation grossly intact to LT  Imaging  MRI of the brain with and without contrast was again reviewed.  In addition to the 4 subcentimeter lesions, the primary left parietal lesion appears largely unchanged, measuring approximately 4 x 3 x 4-1/2 cm.  There is surrounding vasogenic edema.  The lesion is peripherally enhancing.  Impression  - 78 y.o. female metastatic lung cancer and large left parietal lesion.  She has undergone preoperative stereotactic radiosurgery.  Plan  -We will plan on proceeding as scheduled with stereotactic left parietal craniotomy for resection of tumor.  I have reviewed the indications for treatment, as well as the specific risks of the procedure with the patient.  All her questions today were answered.  Informed consent was obtained and she is ready to proceed.

## 2019-03-18 NOTE — Anesthesia Postprocedure Evaluation (Signed)
Anesthesia Post Note  Patient: [name]  Procedure(s) Performed: Left stereotactic craniotomy for tumor resection (Left ) APPLICATION OF CRANIAL NAVIGATION (N/A )     Patient location during evaluation: PACU Anesthesia Type: General Level of consciousness: sedated Pain management: pain level controlled Vital Signs Assessment: post-procedure vital signs reviewed and stable Respiratory status: spontaneous breathing and respiratory function stable Cardiovascular status: stable Postop Assessment: no apparent nausea or vomiting Anesthetic complications: no    Last Vitals:  Vitals:   03/18/19 1445 03/18/19 1500  BP: (!) 93/49 (!) 102/56  Pulse: 85 86  Resp: 13 17  Temp:    SpO2: 98% 95%    Last Pain:  Vitals:   03/18/19 1445  TempSrc:   PainSc: Asleep                 [name] DANIEL

## 2019-03-18 NOTE — Op Note (Signed)
NEUROSURGERY OPERATIVE NOTE   PREOP DIAGNOSIS:  1. Left parietal tumor 2. Metastatic Lung CA   POSTOP DIAGNOSIS: Same  PROCEDURE: 1. Stereotactic left parietal craniotomy for resection of tumor 2. Use of intraoperative microscope for microdissection  SURGEON: Dr. Lisbeth Renshaw, MD  ASSISTANT: Dr. Shirlean Kelly, MD  ANESTHESIA: General Endotracheal  EBL: 100 cc  SPECIMENS: Left parietal tumor for permanent pathology  DRAINS: None  COMPLICATIONS: None immediate  CONDITION: Hemodynamically stable to postanesthesia care unit  HISTORY: [name] is a 78 y.o. female with a history of lung cancer treated several years ago.  She more recently presented to the hospital with headache and speech difficulty.  Work-up included CT scan and MRI which demonstrated 4 subcentimeter brain lesions consistent with metastases, as well as a much larger left parietal lesion.  After her case was discussed at the multidisciplinary neuro-oncology conference, stereotactic radiosurgery was recommended as primary treatment for the small subcentimeter lesions, with preoperative SRS followed by surgical resection for the larger left parietal lesion.  The risks and benefits of the treatment plan were reviewed in detail with the patient.  After all questions were answered informed consent was obtained and witnessed.  PROCEDURE IN DETAIL: The patient was brought to the operating room. After induction of general anesthesia, the patient was positioned on the operative table in the right lateral decubitus position in the [place] head holder. All pressure points were meticulously padded.  Utilizing the preoperative stereotactic MRI scan, surface markers were co-registered until a satisfactory accuracy was achieved.  Utilizing the stereotactic system, the surface projection of the superior sagittal sinus and the left transverse sinus were marked out.  The surface projection of the tumor was also identified.   This was then used to plan a sigmoid shaped left parietal skin incision in order to gain complete access to the tumor.  Skin incision was then marked out and prepped and draped in the usual sterile fashion.  After timeout was conducted, the incision was infiltrated with local anesthetic with epinephrine.  Incision was then made sharply and carried down through the galea.  Raney clips were used to obtain hemostasis on the skin edges.  Periosteum was then elevated and self-retaining retractors were placed.  The stereotactic system was again used to plan out a craniotomy to allow access to the tumor.  2 bur holes were created medially, and a third bur hole laterally.  These were connected with the craniotome and the bone flap was elevated in a single piece.  Hemostasis was secured on the epidural plane.  Dura was then opened in stellate fashion.  The cortex was noted to be discolored consistent with the patient's history of hematoma.  Utilizing the bipolar electrocautery, the pia was coagulated around the border of the clearly visible tumor.  At this point, the microscope was draped sterilely and brought into the field and the remainder of the tumor resection was done under the microscope using microdissection technique.  The white matter surrounding the tumor and hematoma was dissected utilizing a combination of bipolar electrocautery and the ultrasonic aspirator.  Liquid hematoma was identified at the center of the lesion which was evacuated which did significantly debulk the size of the tumor and further allowed circumferential dissection.  Once this was done, the tumor with hematoma was sent for permanent pathology.  At this point, the tumor resection cavity was inspected.  It was also probed with the stereotactic system.  The postero-inferior portion of the resection cavity appeared to have  a small amount of residual tumor which was again removed using the ultrasonic aspirator and the bipolar.  At this  point, the cavity was further inspected and no gross visible tumor was identified.  Hemostasis in the resection bed was then achieved using a combination of bipolar electrocautery and morselized Gelfoam and thrombin.  The dural leaflets were then reapproximated with interrupted and continuous 4-0 Nurolon stitches.  Dural collagen graft was then placed as an onlay.  The bone flap was then replaced with standard titanium plates and screws.  The wound was again irrigated with copious amount of normal saline irrigation.  The galea was reapproximated using interrupted 0 and 3-0 Vicryl stitches and the skin was closed with standard surgical skin staples.  Bacitracin ointment and sterile dressing was applied.  At the end of the case all sponge, needle, instrument, and cottonoid counts are correct.  The patient was then extubated, transferred to the stretcher, and taken to the postanesthesia care unit in stable hemodynamic condition.

## 2019-03-18 NOTE — Anesthesia Procedure Notes (Signed)
Procedure Name: Intubation Date/Time: 03/18/2019 8:55 AM Performed by: Wilburn Cornelia, CRNA Pre-anesthesia Checklist: Patient identified, Emergency Drugs available, Suction available, Patient being monitored and Timeout performed Patient Re-evaluated:Patient Re-evaluated prior to induction Oxygen Delivery Method: Circle system utilized Preoxygenation: Pre-oxygenation with 100% oxygen Induction Type: IV induction and Rapid sequence Laryngoscope Size: Mac and 3 Grade View: Grade I Tube type: Oral Tube size: 7.0 mm Number of attempts: 1 Airway Equipment and Method: Stylet Placement Confirmation: ETT inserted through vocal cords under direct vision,  positive ETCO2,  CO2 detector and breath sounds checked- equal and bilateral Secured at: 21 cm Tube secured with: Tape Dental Injury: Teeth and Oropharynx as per pre-operative assessment

## 2019-03-19 ENCOUNTER — Inpatient Hospital Stay (HOSPITAL_COMMUNITY): Payer: PPO

## 2019-03-19 ENCOUNTER — Encounter (HOSPITAL_COMMUNITY): Payer: Self-pay | Admitting: Neurosurgery

## 2019-03-19 LAB — CBC
HCT: 33.1 % — ABNORMAL LOW (ref 36.0–46.0)
Hemoglobin: 10.8 g/dL — ABNORMAL LOW (ref 12.0–15.0)
MCH: 29.2 pg (ref 26.0–34.0)
MCHC: 32.6 g/dL (ref 30.0–36.0)
MCV: 89.5 fL (ref 80.0–100.0)
Platelets: 315 10*3/uL (ref 150–400)
RBC: 3.7 MIL/uL — ABNORMAL LOW (ref 3.87–5.11)
RDW: 12.4 % (ref 11.5–15.5)
WBC: 14.9 10*3/uL — ABNORMAL HIGH (ref 4.0–10.5)
nRBC: 0 % (ref 0.0–0.2)

## 2019-03-19 LAB — BASIC METABOLIC PANEL
Anion gap: 10 (ref 5–15)
BUN: 7 mg/dL — ABNORMAL LOW (ref 8–23)
CO2: 26 mmol/L (ref 22–32)
Calcium: 8.8 mg/dL — ABNORMAL LOW (ref 8.9–10.3)
Chloride: 99 mmol/L (ref 98–111)
Creatinine, Ser: 0.65 mg/dL (ref 0.44–1.00)
GFR calc Af Amer: >60 mL/min (ref >60)
GFR calc non Af Amer: >60 mL/min (ref >60)
Glucose, Bld: 145 mg/dL — ABNORMAL HIGH (ref 70–99)
Potassium: 3.8 mmol/L (ref 3.5–5.1)
Sodium: 135 mmol/L (ref 135–145)

## 2019-03-19 MED ORDER — ORAL CARE MOUTH RINSE
15.0000 mL | Freq: Two times a day (BID) | OROMUCOSAL | Status: DC
  Administered 2019-03-19 (×2): 15 mL via OROMUCOSAL

## 2019-03-19 MED ORDER — CHLORHEXIDINE GLUCONATE CLOTH 2 % EX PADS
6.0000 | MEDICATED_PAD | Freq: Every day | CUTANEOUS | Status: DC
  Administered 2019-03-19: 6 via TOPICAL

## 2019-03-19 MED ORDER — GADOBUTROL 1 MMOL/ML IV SOLN
6.0000 mL | Freq: Once | INTRAVENOUS | Status: AC | PRN
  Administered 2019-03-19: 6 mL via INTRAVENOUS

## 2019-03-19 NOTE — Progress Notes (Signed)
  Speech Language Pathology Treatment: Cognitive-Linquistic(Aphasia)  Patient Details Name: [name] MRN: [id number] DOB: Aug 24, 1941 Today's Date: 03/19/2019 Time: 6720-9470 SLP Time Calculation (min) (ACUTE ONLY): 17 min  Assessment / Plan / Recommendation Clinical Impression  Pt was seen for aphasia treatment and was cooperative throughout the session. She provided 6-13 items per category during divergent naming tasks with an average of 8 items per category. She demonstrated 40% accuracy during structured sentence formulation tasks increasing to 100% accuracy with mod cues. Min-moderate support was needed for word retrieval during less structured conversational tasks. SLP will continue to follow pt.    HPI HPI: Pt is a 78 y.o. female with a history of lung CA who recently presented with headache and speech difficulty.  Work-up included CT scan which demonstrated a left parietal occipital intraparenchymal hemorrhage.  With her history, further work-up included MRI scan which demonstrated for subcentimeter enhancing lesions consistent with metastatic disease, as well as peripherally enhancing lesion underlying the left parietal hematoma, much larger measuring nearly 4 cm. Pt underwent the stereotactic radiosurgery on 03/17/19/ MRI of the brain from 03/19/19 revealed interval resection of dominant cystic mass in the left parietal lobe. Residual enhancement along the anterior and inferior margin the surgical cavity measuring 2.5 cm maximally. This may represent residual tumor.      SLP Plan  Continue with current plan of care  Patient needs continued Speech Lanaguage Pathology Services    Recommendations                   Follow up Recommendations: Skilled Nursing facility SLP Visit Diagnosis: Aphasia (R47.01) Plan: Continue with current plan of care       [name], [place], [place] Office number [phone] Pager [place] 03/19/2019, 2:48 PM

## 2019-03-19 NOTE — Evaluation (Signed)
Speech Language Pathology Evaluation Patient Details Name: [name] MRN: [id number] DOB: Oct 13, 1940 Today's Date: 03/19/2019 Time: 4827-0786 SLP Time Calculation (min) (ACUTE ONLY): 18 min  Problem List:  Patient Active Problem List   Diagnosis Date Noted  . Brain tumor ([place]) 03/18/2019  . Essential hypertension 03/12/2019  . Brain metastases ([place]) 03/06/2019  . Cytotoxic brain edema ([place]) 03/04/2019  . ICH (intracerebral hemorrhage) (H[facility]) - L parietal lobe 03/03/2019  . Osteoporosis 05/16/2018  . S/P ORIF (open reduction internal fixation) fracture right patella 03/18/18 03/26/2018  . Cellulitis, wound, post-operative 03/22/2018  . Fracture of right patella   . Normocytic anemia   . Esophageal dysphagia 11/27/2017  . Heme positive stool 11/27/2017  . Iron deficiency anemia 12/05/2016  . SIADH (syndrome of inappropriate ADH production) ([place])   . Polypharmacy 09/14/2016  . Muscle weakness (generalized)   . Syncope 09/10/2016  . Rib pain on left side   . Hypersomnia 02/16/2016  . Acute bronchitis 02/16/2016  . Exertional dyspnea 12/20/2015  . HLD (hyperlipidemia) 11/23/2014  . Change in bowel habits 11/10/2014  . Abdominal pain, left lower quadrant 11/10/2014  . Emphysema lung ([place]) 11/08/2014  . Helicobacter pylori gastritis 07/30/2014  . Acute on chronic respiratory failure with hypoxia ([place]) 04/10/2014  . Wrist fracture 04/09/2014  . 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 T12 burst fracture ([place]) 01/13/2014  . Constipation 12/14/2013  . Acute respiratory failure with hypoxia ([place]) 12/14/2013  . COPD (chronic obstructive pulmonary disease) ([place]) 12/14/2013  . Compression fracture of thoracic vertebra ([place]) 12/14/2013  . Hypoxia 12/13/2013  . Compression fracture 12/13/2013  . Diastolic dysfunction   . 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 AAA (abdominal aortic aneurysm) without rupture ([place]) 02/26/2012  .  Dyspepsia 02/11/2012  . Aneurysm ([place]) 02/11/2012  . Trigeminal neuralgia   . Epigastric pain 02/21/2011  . Dysphagia 02/21/2011  . 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 HELICOBACTER PYLORI GASTRITIS, HX OF 12/30/2009  . CONTRAST DYE ALLERGY 12/30/2009   Past Medical History:  Past Medical History:  Diagnosis Date  . 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 Allergic rhinitis   . 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 Anxiety disorder   . Aortic aneurysm ([place])   . Complication of anesthesia 2011   bloodpressure dropped during colonoscopy, not problems since  . Compression fracture   . COPD (chronic obstructive pulmonary disease) ([place])   . Depression   . Diastolic dysfunction   . Diverticulosis   . Dysphagia   . Emphysema lung ([place]) 11/08/2014  . Headache   . Hiatal hernia   . History of kidney stones   . Hypothyroidism   . IBS (irritable bowel syndrome)   . Iron deficiency anemia due to chronic blood loss 12/05/2016  . On home O2 12/15/2013   chronic hypoxia  . Pneumonia   . PUD (peptic ulcer disease)    35yrs  . Shortness of breath    with exertion  . SIADH (syndrome of inappropriate ADH production) ([place])   . Trigeminal neuralgia    Past Surgical History:  Past Surgical History:  Procedure Laterality Date  . ABDOMINAL HYSTERECTOMY    . APPLICATION OF CRANIAL NAVIGATION N/A 03/18/2019   Procedure: APPLICATION OF CRANIAL NAVIGATION;  Surgeon: Consuella Lose, MD;  Location: [facility];  Service: Neurosurgery;  Laterality: N/A;  . BACK SURGERY  January 13, 2014  .  CATARACT EXTRACTION    . CATARACT EXTRACTION W/PHACO  09/02/2012   Procedure: CATARACT EXTRACTION PHACO AND INTRAOCULAR LENS PLACEMENT (IOC);  Surgeon: Elta Guadeloupe T. Gershon Crane, MD;  Location: [facility];  Service: Ophthalmology;  Laterality: Left;  CDE:10.35  . CHOLECYSTECTOMY    . 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 COLONOSCOPY  N/A 11/23/2014   Dr. Oneida Alar: moderate diverticulosis, hemorrhoids, redundant colon. next TCS in 10-15 years.   . CRANIOTOMY Left 03/18/2019   Procedure: Left stereotactic craniotomy for tumor resection;  Surgeon: Consuella Lose, MD;  Location: [facility];  Service: Neurosurgery;  Laterality: Left;  Left stereotactic craniotomy for tumor resection  . ENDARTERECTOMY Right 08/21/2013   Procedure: RIGHT CAROTID ANEURYSM RESECTION;  Surgeon: Rosetta Posner, MD;  Location: [facility];  Service: Vascular;  Laterality: Right;  . ESOPHAGEAL DILATION  02/24/2018   Procedure: ESOPHAGEAL DILATION;  Surgeon: Danie Binder, MD;  [facility];  Service: Endoscopy;;  . ESOPHAGOGASTRODUODENOSCOPY  11/13/2009   w/dilation to 45mm, gastric ulceration (H.Pylori) s/p treatment  . ESOPHAGOGASTRODUODENOSCOPY  11/21/2009   distal esophageal web, gastritis  . ESOPHAGOGASTRODUODENOSCOPY  03/14/12   ACZ:[id number] in the distal esophagus/Mild gastritis/small HH. + H.pylori, prescribed Pylera. Finished treatment.   . ESOPHAGOGASTRODUODENOSCOPY N/A 12/13/2017   Procedure: ESOPHAGOGASTRODUODENOSCOPY (EGD);  Surgeon: Danie Binder, MD;  [facility];  Service: Endoscopy;  Laterality: N/A;  8:30am  . ESOPHAGOGASTRODUODENOSCOPY N/A 02/24/2018   Procedure: ESOPHAGOGASTRODUODENOSCOPY (EGD);  Surgeon: Danie Binder, MD;  [facility];  Service: Endoscopy;  Laterality: N/A;  11:00am  . fatty tumor removal from lt groin    . FRACTURE SURGERY Left    hand and left ring finger  . GIVENS CAPSULE STUDY N/A 12/26/2017   Procedure: GIVENS CAPSULE STUDY;  Surgeon: Danie Binder, MD;  [facility];  Service: Endoscopy;  Laterality: N/A;  7:30am  . GIVENS CAPSULE STUDY N/A 02/24/2018   Procedure: GIVENS CAPSULE STUDY;  Surgeon: Danie Binder, MD;  [facility];  Service: Endoscopy;  Laterality: N/A;  . KNEE SURGERY     patella tendon repair june 2019 [facility]  . LUNG CANCER SURGERY  12/2010    Left VATS, minithoracotomy, LLL superior segmentectomy  . ORIF PATELLA Right 03/18/2018   Procedure: OPEN REDUCTION INTERNAL (ORIF) FIXATION RIGHT PATELLA;  Surgeon: Carole Civil, MD;  [facility];  Service: Orthopedics;  Laterality: Right;  . ORIF WRIST FRACTURE Left 04/09/2014   Procedure: OPEN REDUCTION INTERNAL FIXATION (ORIF) WRIST FRACTURE;  Surgeon: Renette Butters, MD;  Location: [facility];  Service: Orthopedics;  Laterality: Left;  . PERCUTANEOUS PINNING Left 04/09/2014   Procedure: PERCUTANEOUS PINNING EXTREMITY;  Surgeon: Renette Butters, MD;  Location: [facility];  Service: Orthopedics;  Laterality: Left;  . SAVORY DILATION N/A 12/13/2017   Procedure: SAVORY DILATION;  Surgeon: Danie Binder, MD;  [facility];  Service: Endoscopy;  Laterality: N/A;  . TUBAL LIGATION    . vocal cord surgery  02/06/2011   laryngoscopy with bilateral vocal cord Radiesse injection for vocal cord paralysis  . YAG LASER APPLICATION Left 10/93/2355   Procedure: YAG LASER APPLICATION;  Surgeon: Rutherford Guys, MD;  [facility];  Service: Ophthalmology;  Laterality: Left;   HPI:  [name] is a 78 y.o. female with a history of lung CA who recently presented with headache and speech difficulty.  Work-up included CT scan which demonstrated a left parietal occipital intraparenchymal  hemorrhage.  With her history, further work-up included MRI scan which demonstrated for subcentimeter enhancing lesions consistent with metastatic disease, as well as peripherally enhancing lesion underlying the left parietal hematoma, much larger measuring nearly 4 cm. [name] underwent the stereotactic radiosurgery on 03/17/19/ MRI of the brain from 03/19/19 revealed interval resection of dominant cystic mass in the left parietal lobe. Residual enhancement along the anterior and inferior margin the surgical cavity measuring 2.5 cm maximally. This may represent residual tumor.   Assessment / Plan / Recommendation Clinical Impression   [name] is known to speech pathology and she participated in aphasia treatment during her previous admission. She reported today that her language skills are currently approximately 50% back to baseline and that she "can tell it's not like it was before surgery". Today she presented with mild expressive aphasia characterized by word retrieval difficulty during conversation and the need for additional processing time during structured naming tasks. She inconsistently demonstrated perseveration and semantic paraphasias. Receptively, she demonstrated difficulty with auditory comprehension at the paragraph level but her auditory comprehension skills were otherwise functional. Overall, her language skills today were worse than on her date of discharge but still notably improved compared to the initial evaluation which was conducted on 03/04/19 during her last admission. Skilled SLP services are clinically indicated at this time.     SLP Assessment  SLP Recommendation/Assessment: Patient needs continued Speech Lanaguage Pathology Services SLP Visit Diagnosis: Aphasia (R47.01)    Follow Up Recommendations  Skilled Nursing facility    Frequency and Duration min 2x/week  2 weeks      SLP Evaluation Cognition  Overall Cognitive Status: Within Functional Limits for tasks assessed Arousal/Alertness: Awake/alert Orientation Level: Oriented X4 Attention: Focused;Sustained Focused Attention: Appears intact Sustained Attention: Appears intact Memory: Impaired Memory Impairment: Retrieval deficit;Decreased recall of new information(Immediate: 3/3; Delayed: 2/3; with cue: 1/1) Awareness: Appears intact Problem Solving: Appears intact(5/5) Executive Function: Reasoning;Sequencing Reasoning: Appears intact Sequencing: Appears intact       Comprehension  Auditory Comprehension Overall Auditory Comprehension: Impaired Yes/No Questions: Impaired Basic Biographical Questions: (5/5) Complex Questions:  (5/5) Paragraph Comprehension (via yes/no questions): (3/4) Commands: Within Functional Limits Two Step Basic Commands: (4/4) Multistep Basic Commands: (4/4) Conversation: Complex Visual Recognition/Discrimination Discrimination: Within Function Limits Reading Comprehension Reading Status: Not tested([name] reported reduced ability to see text)    Expression Verbal Expression Overall Verbal Expression: Impaired Initiation: No impairment Automatic Speech: Counting;Day of week;Month of year(WNL) Level of Generative/Spontaneous Verbalization: Conversation Repetition: No impairment(5/5) Naming: Impairment Responsive: (5/5 with additional processing time) Confrontation: Within functional limits(10/10) Convergent: (Sentence completion: 5/5) Verbal Errors: Perseveration;Semantic paraphasias;Aware of errors Pragmatics: No impairment   Oral / Motor  Oral Motor/Sensory Function Overall Oral Motor/Sensory Function: Within functional limits Motor Speech Overall Motor Speech: Appears within functional limits for tasks assessed Respiration: Within functional limits Phonation: Normal Resonance: Within functional limits Articulation: Within functional limitis Intelligibility: Intelligible Motor Planning: Witnin functional limits Motor Speech Errors: Not applicable   Shahram Alexopoulos I. [name], [place], [place] Office number [phone] Pager [place] 03/19/2019, 2:38 PM

## 2019-03-19 NOTE — Progress Notes (Signed)
  NEUROSURGERY PROGRESS NOTE   Pt seen and examined. No issues overnight. Has mild HA, does report difficulty reading her phone and some word-finding difficulty when tired.  EXAM: Temp:  [97.3 F (36.3 C)-99.3 F (37.4 C)] 98.7 F (37.1 C) (06/11 1200) Pulse Rate:  [74-99] 89 (06/11 1200) Resp:  [12-23] 17 (06/11 1200) BP: (80-134)/(41-94) 101/66 (06/11 1200) SpO2:  [92 %-98 %] 98 % (06/11 1200) Arterial Line BP: (92-135)/(48-96) 115/48 (06/11 1000) Intake/Output      06/10 0701 - 06/11 0700 06/11 0701 - 06/12 0700   P.O.  200   I.V. (mL/kg) 1811.2 (31.8) 355.2 (6.2)   IV Piggyback 300.1 100   Total Intake(mL/kg) 2111.2 (37) 655.2 (11.5)   Urine (mL/kg/hr) 2300 (1.7)    Blood 100    Total Output 2400    Net -288.8 +655.2         Awake, alert, oriented Speech fluent CN grossly intact Good strength throughout Wound dressing with minimal old blood.  LABS: Lab Results  Component Value Date   CREATININE 0.65 03/19/2019   BUN 7 (L) 03/19/2019   NA 135 03/19/2019   K 3.8 03/19/2019   CL 99 03/19/2019   CO2 26 03/19/2019   Lab Results  Component Value Date   WBC 14.9 (H) 03/19/2019   HGB 10.8 (L) 03/19/2019   HCT 33.1 (L) 03/19/2019   MCV 89.5 03/19/2019   PLT 315 03/19/2019    IMAGING:  MRI reviewed, demonstrates near-total resection. Thin linear enhancement seen in the posterolateral aspect of the resection cavity. Unchanged surrounding edema and mass effect on the trigone. No HCP.  IMPRESSION: - 78 y.o. female POD#1 s/p resection of left parietal lung met, doing well.  PLAN: - OOB with PT/OT today - SLP eval - cont dex and Keppra

## 2019-03-20 MED ORDER — PANTOPRAZOLE SODIUM 40 MG PO TBEC
40.0000 mg | DELAYED_RELEASE_TABLET | Freq: Every day | ORAL | Status: DC
  Administered 2019-03-20: 40 mg via ORAL
  Filled 2019-03-20: qty 1

## 2019-03-20 MED ORDER — CALCIUM CARBONATE ANTACID 500 MG PO CHEW
1.0000 | CHEWABLE_TABLET | ORAL | Status: DC | PRN
  Administered 2019-03-20: 200 mg via ORAL
  Filled 2019-03-20: qty 1

## 2019-03-20 NOTE — Progress Notes (Signed)
SLP Cancellation Note  Patient Details Name: [name] MRN: [id number] DOB: [birth year]   Cancelled treatment:       Reason Eval/Treat Not Completed: Patient at procedure or test/unavailable(Pt needed to go to use the bathroom when SLP arrived. Will follow up)  Shameek Nyquist I. [name], [place], [place] Office number [phone] Pager [place] 03/20/2019, 11:11 AM

## 2019-03-20 NOTE — Evaluation (Signed)
Occupational Therapy Evaluation Patient Details Name: [name] MRN: [id number] DOB: 1940/11/18 Today's Date: 03/20/2019    History of Present Illness Pt is a 78 y.o. F with significant PMH of lung CA, COPD, knee surgery and recent left parietal occipital intraparenchymal hemorrhage. MRI significant for subcentimeter enhancing lesions consistent with metastatic disease, as well as peripherally enhancing lesion underlying left parietal hematoma. Now s/p stereotactic left parietal craniotomy for resection of tumor.    Clinical Impression   Pt admitted with above. She demonstrates the below listed deficits and will benefit from continued OT to maximize safety and independence with BADLs.  Pt presents to OT with generalized weakness, decreased activity tolerance, impaired balance, as well as Rt visual field deficit, impaired cognition and communication deficits.  Pt requires min guard assist for ADLs with 02 sats >92% on 2L supplemental 02.  Pt was living alone prior to recent admission.  She discharged to SNF for rehab at that time, and now plans to return home with her son, who can provide 24 hour assist, per her report.  Recommend HHOT.       Follow Up Recommendations  Home health OT;Supervision/Assistance - 24 hour    Equipment Recommendations  None recommended by OT    Recommendations for Other Services       Precautions / Restrictions Precautions Precautions: Fall Restrictions Weight Bearing Restrictions: No      Mobility Bed Mobility Overal bed mobility: Modified Independent                Transfers Overall transfer level: Needs assistance Equipment used: Rolling walker (2 wheeled) Transfers: Sit to/from Stand Sit to Stand: Supervision Stand pivot transfers: Min guard       General transfer comment: Pt requires min guard assist for safety due to visual field deficit     Balance Overall balance assessment: Needs assistance Sitting-balance support: No upper  extremity supported;Feet supported Sitting balance-Leahy Scale: Good     Standing balance support: During functional activity;Single extremity supported;Bilateral upper extremity supported Standing balance-Leahy Scale: Poor Standing balance comment: Reliant on BUE support                            ADL either performed or assessed with clinical judgement   ADL Overall ADL's : Needs assistance/impaired Eating/Feeding: Modified independent   Grooming: Wash/dry hands;Wash/dry face;Oral care;Min guard;Standing   Upper Body Bathing: Set up;Supervision/ safety;Sitting   Lower Body Bathing: Min guard;Sit to/from stand   Upper Body Dressing : Set up;Supervision/safety;Sitting   Lower Body Dressing: Min guard;Sit to/from stand   Toilet Transfer: Min guard;Ambulation;Comfort height toilet;RW Armed forces technical officer Details (indicate cue type and reason): requires min cues to avoid objects on Rt  Toileting- Water quality scientist and Hygiene: Min guard;Sit to/from stand       Functional mobility during ADLs: Min guard;Rolling walker       Vision Patient Visual Report: Peripheral vision impairment Vision Assessment?: Yes Eye Alignment: Within Functional Limits Ocular Range of Motion: Within Functional Limits Alignment/Gaze Preference: Within Defined Limits Tracking/Visual Pursuits: Able to track stimulus in all quads without difficulty Visual Fields: Right homonymous hemianopsia Additional Comments: Pt is aware of visual deficits, but requires min verbal cues to avoid obstacles on her Rt      Perception Perception Perception Tested?: Yes   Praxis Praxis Praxis tested?: Within functional limits    Pertinent Vitals/Pain Pain Assessment: Faces Faces Pain Scale: Hurts little more Pain Location: headache Pain Descriptors /  Indicators: Aching Pain Intervention(s): Monitored during session     Hand Dominance Right   Extremity/Trunk Assessment Upper Extremity  Assessment Upper Extremity Assessment: Overall WFL for tasks assessed   Lower Extremity Assessment Lower Extremity Assessment: Overall WFL for tasks assessed       Communication Communication Communication: Expressive difficulties;Receptive difficulties(mild word finding difficulty)   Cognition Arousal/Alertness: Awake/alert Behavior During Therapy: WFL for tasks assessed/performed Overall Cognitive Status: Impaired/Different from baseline Area of Impairment: Problem solving;Awareness;Attention                       Following Commands: Follows multi-step commands inconsistently;Follows one step commands with increased time Safety/Judgement: Decreased awareness of deficits Awareness: Emergent Problem Solving: Difficulty sequencing;Requires verbal cues;Requires tactile cues General Comments: Pt requires min cues for problem solving and sequencing of novel activities    General Comments  02 sats 92% on 2L supplemtal 02    Exercises     Shoulder Instructions      Home Living Family/patient expects to be discharged to:: Private residence Living Arrangements: Children(will be living with son ) Available Help at Discharge: Family;Available 24 hours/day(son is working from home) Type of Home: House Home Access: Stairs to enter [employer] of Steps: 4   Home Layout: One level     Bathroom Shower/Tub: Tub/shower unit;Curtain         Home Equipment: Environmental consultant - 4 wheels;Shower seat;Other (comment);Bedside commode   Additional Comments: states ex husband will also assist and  provide transportation to appointments      Prior Functioning/Environment Level of Independence: Independent with assistive device(s)        Comments: uses walker        OT Problem List: Decreased strength;Decreased activity tolerance;Impaired vision/perception;Impaired balance (sitting and/or standing);Decreased cognition;Decreased safety awareness      OT  Treatment/Interventions: Self-care/ADL training;DME and/or AE instruction;Therapeutic activities;Cognitive remediation/compensation;Visual/perceptual remediation/compensation;Patient/family education;Balance training    OT Goals(Current goals can be found in the care plan section) Acute Rehab OT Goals Patient Stated Goal: To be able to go home  OT Goal Formulation: With patient Time For Goal Achievement: 04/03/19 Potential to Achieve Goals: Good ADL Goals Pt Will Perform Grooming: (P) with supervision;standing Pt Will Perform Upper Body Bathing: (P) with set-up;sitting Pt Will Perform Lower Body Bathing: (P) with supervision;sit to/from stand Pt Will Perform Upper Body Dressing: (P) with set-up;sitting Pt Will Perform Lower Body Dressing: (P) with supervision;sit to/from stand Pt Will Transfer to Toilet: (P) with supervision;ambulating;regular height toilet;grab bars Pt Will Perform Toileting - Clothing Manipulation and hygiene: (P) with supervision;sit to/from stand  OT Frequency: Min 2X/week   Barriers to D/C:            Co-evaluation              AM-PAC OT "6 Clicks" Daily Activity     Outcome Measure Help from another person eating meals?: None Help from another person taking care of personal grooming?: A Little Help from another person toileting, which includes using toliet, bedpan, or urinal?: A Little Help from another person bathing (including washing, rinsing, drying)?: A Little Help from another person to put on and taking off regular upper body clothing?: A Little Help from another person to put on and taking off regular lower body clothing?: A Little 6 Click Score: 19   End of Session Equipment Utilized During Treatment: Rolling walker;Oxygen Nurse Communication: Mobility status  Activity Tolerance: Patient tolerated treatment well Patient left: in chair;with call bell/phone within reach;with bed  alarm set  OT Visit Diagnosis: Unsteadiness on feet (R26.81);Low  vision, both eyes (H54.2);Cognitive communication deficit (R41.841) Symptoms and signs involving cognitive functions: Cerebral infarction                Time: 1153-1229 OT Time Calculation (min): 36 min Charges:  OT General Charges $OT Visit: 1 Visit OT Evaluation $OT Eval Moderate Complexity: 1 Mod OT Treatments $Self Care/Home Management : 8-22 mins  Lucille Passy, OTR/L [place] Pager [phone] Office [phone]   Lucille Passy M 03/20/2019, 12:44 PM

## 2019-03-20 NOTE — Evaluation (Signed)
Physical Therapy Evaluation Patient Details Name: [name] MRN: [id number] DOB: 11-Mar-1941 Today's Date: 03/20/2019   History of Present Illness  Pt is a 78 y.o. F with significant PMH of lung CA, COPD, knee surgery and recent left parietal occipital intraparenchymal hemorrhage. MRI significant for subcentimeter enhancing lesions consistent with metastatic disease, as well as peripherally enhancing lesion underlying left parietal hematoma. Now s/p stereotactic left parietal craniotomy for resection of tumor.   Clinical Impression  Pt admitted with above diagnosis. Pt currently with functional limitations due to the deficits listed below (see PT Problem List). Pt re-admitted from short term SNF (3-4 day stay), now states she would like to d/c home with her son who is able to provide 24/7 assistance. On PT evaluation, pt ambulating block around unit with walker and min guard assist. Presents with decreased functional mobility secondary to right visual field deficit, decreased endurance, balance impairments, and difficulty with problem solving tasks. Recommending HHPT with 24/7 supervision/assist to maximize functional independence. Education re: home set up with adequate lighting, compensating for visual field deficit during mobility.     Follow Up Recommendations Home health PT;Supervision/Assistance - 24 hour    Equipment Recommendations  None recommended by PT    Recommendations for Other Services       Precautions / Restrictions Precautions Precautions: Fall Restrictions Weight Bearing Restrictions: No      Mobility  Bed Mobility Overal bed mobility: Modified Independent             General bed mobility comments: No physical assist required, preferring to scoot down to end of bed with all 4 bed rails up  Transfers Overall transfer level: Needs assistance Equipment used: Rolling walker (2 wheeled) Transfers: Sit to/from Stand Sit to Stand: Supervision          General transfer comment: Good power up   Ambulation/Gait Ambulation/Gait assistance: Min guard Gait Distance (Feet): 300 Feet Assistive device: Rolling walker (2 wheeled) Gait Pattern/deviations: Step-through pattern;Decreased stride length;Trunk flexed Gait velocity: decreased   General Gait Details: Cues for looking up, scanning environment for obstacles. 1 instance of running into object on right side  Stairs            Wheelchair Mobility    Modified Rankin (Stroke Patients Only) Modified Rankin (Stroke Patients Only) Pre-Morbid Rankin Score: Slight disability Modified Rankin: Moderately severe disability     Balance Overall balance assessment: Needs assistance Sitting-balance support: No upper extremity supported;Feet supported Sitting balance-Leahy Scale: Good     Standing balance support: During functional activity;Single extremity supported;Bilateral upper extremity supported Standing balance-Leahy Scale: Poor Standing balance comment: Reliant on BUE support                              Pertinent Vitals/Pain Pain Assessment: Faces Faces Pain Scale: Hurts little more Pain Location: headache Pain Intervention(s): Monitored during session    Home Living Family/patient expects to be discharged to:: Private residence Living Arrangements: Children(will be living with son ) Available Help at Discharge: Family;Available 24 hours/day(son is working from home) Type of Home: House Home Access: Stairs to enter   [employer] of Steps: 4 Home Layout: One level Home Equipment: Environmental consultant - 4 wheels;Shower seat;Other (comment) Additional Comments: states ex husband will provide transportation to appointments    Prior Function Level of Independence: Independent with assistive device(s)         Comments: uses walker     Hand Dominance  Dominant Hand: Right    Extremity/Trunk Assessment   Upper Extremity Assessment Upper Extremity  Assessment: Defer to OT evaluation    Lower Extremity Assessment Lower Extremity Assessment: Overall WFL for tasks assessed       Communication   Communication: Expressive difficulties(mild word finding difficulty)  Cognition Arousal/Alertness: Awake/alert Behavior During Therapy: WFL for tasks assessed/performed Overall Cognitive Status: Impaired/Different from baseline Area of Impairment: Problem solving;Awareness                           Awareness: Emergent Problem Solving: Difficulty sequencing;Requires verbal cues;Requires tactile cues General Comments: Pt with decreased problem solving skills and not very receptive to education i.e. preferring to get out of bed with all 4 bed rails up, even though she has to scoot all the way to the edge of the bed      General Comments  Session on 2L O2    Exercises     Assessment/Plan    PT Assessment Patient needs continued PT services  PT Problem List Decreased activity tolerance;Decreased balance;Decreased mobility;Decreased coordination;Decreased cognition;Decreased safety awareness       PT Treatment Interventions DME instruction;Gait training;Functional mobility training;Therapeutic activities;Therapeutic exercise;Balance training;Patient/family education;Cognitive remediation    PT Goals (Current goals can be found in the Care Plan section)  Acute Rehab PT Goals Patient Stated Goal: "go home with my son." PT Goal Formulation: With patient Time For Goal Achievement: 04/03/19 Potential to Achieve Goals: Good    Frequency Min 3X/week   Barriers to discharge        Co-evaluation               AM-PAC PT "6 Clicks" Mobility  Outcome Measure Help needed turning from your back to your side while in a flat bed without using bedrails?: None Help needed moving from lying on your back to sitting on the side of a flat bed without using bedrails?: None Help needed moving to and from a bed to a chair (including  a wheelchair)?: A Little Help needed standing up from a chair using your arms (e.g., wheelchair or bedside chair)?: None Help needed to walk in hospital room?: A Little Help needed climbing 3-5 steps with a railing? : A Little 6 Click Score: 21    End of Session Equipment Utilized During Treatment: Gait belt;Oxygen Activity Tolerance: Patient tolerated treatment well Patient left: in bed;with call bell/phone within reach;with bed alarm set Nurse Communication: Mobility status PT Visit Diagnosis: Unsteadiness on feet (R26.81);Difficulty in walking, not elsewhere classified (R26.2)    Time: 8938-1017 PT Time Calculation (min) (ACUTE ONLY): 36 min   Charges:   PT Evaluation $PT Eval Moderate Complexity: 1 Mod PT Treatments $Gait Training: 8-22 mins        Ellamae Sia, PT, DPT Acute Rehabilitation Services Pager [phone] Office [phone]   Willy Eddy 03/20/2019, 9:19 AM

## 2019-03-20 NOTE — Progress Notes (Signed)
  Speech Language Pathology Treatment: Cognitive-Linquistic(Aphasia)  Patient Details Name: [name] MRN: [id number] DOB: 04-07-1941 Today's Date: 03/20/2019 Time: 1715-1730 SLP Time Calculation (min) (ACUTE ONLY): 15 min  Assessment / Plan / Recommendation Clinical Impression  Pt was seen for aphasia treatment session and was cooperative throughout the session. She reported that she was not feeling as well today as yesterday and stated that participating in the session was making her headache worse. For these reasons it was agreed that the session be abbreviated. Her overall processing speed was reduced compared to that which was demonstrated on 03/19/19. She provided 5-9 items per abstract category during divergent naming tasks with an average of 6 items per category. She completed sentence formulation tasks with 60% accuracy increasing to 100% accuracy with mod cues. SLP will continue to follow pt.    HPI HPI: Pt is a 78 y.o. female with a history of lung CA who recently presented with headache and speech difficulty.  Work-up included CT scan which demonstrated a left parietal occipital intraparenchymal hemorrhage.  With her history, further work-up included MRI scan which demonstrated for subcentimeter enhancing lesions consistent with metastatic disease, as well as peripherally enhancing lesion underlying the left parietal hematoma, much larger measuring nearly 4 cm. Pt underwent the stereotactic radiosurgery on 03/17/19/ MRI of the brain from 03/19/19 revealed interval resection of dominant cystic mass in the left parietal lobe. Residual enhancement along the anterior and inferior margin the surgical cavity measuring 2.5 cm maximally. This may represent residual tumor.      SLP Plan  Continue with current plan of care       Recommendations                   Follow up Recommendations: Home health SLP SLP Visit Diagnosis: Aphasia (R47.01) Plan: Continue with current plan of  care       Darnette Lampron I. [name], [place], [place] Office number [phone] Pager [place] 03/20/2019, 5:42 PM

## 2019-03-20 NOTE — Progress Notes (Signed)
  NEUROSURGERY PROGRESS NOTE   No issues overnight.  Endorses mild word finding difficulty, but improved from yesterday Denies HA, dizziness Pt talked to son and would prefer to go home with him vs. SNF at discharge  EXAM:  BP 109/71   Pulse 74   Temp 98.6 F (37 C) (Oral)   Resp 18   Ht '5\' 5"'$  (1.651 m)   Wt 57 kg   SpO2 96%   BMI 20.91 kg/m   Awake, alert, oriented  Speech fluent, appropriate  CN grossly intact  MAEW with symmetric strength Wound dressing: old dried blood. No active drainage  IMPRESSION/PLAN 78 y.o. female pod #2 s/p resection left parietal lung met. Doing well. - PT/OT/SLP today. Determine d/c plan after therapy  - Continue keppra and decadron - Transfer to progressive   [name], PA-C [facility]

## 2019-03-21 MED ORDER — OXYCODONE-ACETAMINOPHEN 5-325 MG PO TABS: 1.0000 | ORAL_TABLET | ORAL | 0 refills | Status: DC | PRN

## 2019-03-21 MED ORDER — LEVETIRACETAM 500 MG PO TABS: 500.0000 mg | ORAL_TABLET | Freq: Two times a day (BID) | ORAL | 2 refills | Status: DC

## 2019-03-21 MED ORDER — METHYLPREDNISOLONE 4 MG PO TBPK: ORAL_TABLET | ORAL | 0 refills | Status: DC

## 2019-03-21 NOTE — Progress Notes (Addendum)
  NEUROSURGERY PROGRESS NOTE   No issues overnight.  Mild word finding difficulties Denies HA, changes in vision  EXAM:  BP 104/70 (BP Location: Left Arm)   Pulse 65   Temp 98.2 F (36.8 C) (Oral)   Resp 18   Ht '5\' 5"'$  (1.651 m)   Wt 57 kg   SpO2 96%   BMI 20.91 kg/m   Awake, alert, oriented  Speech mild word finding difficulty  CN grossly intact  5/5 BUE/BLE  Incision mild dried blood on bandage  IMPRESSION/PLAN 78 y.o. female pod #3 s/p resection left parietal lung met. Doing well. Ready for d/c - PT/OT/SLP rec HH. Discussed with son who is ready for pt's d/c

## 2019-03-21 NOTE — Plan of Care (Signed)
  Problem: Education: Goal: Knowledge of the prescribed therapeutic regimen will improve Outcome: Completed/Met   Problem: Clinical Measurements: Goal: Usual level of consciousness will be regained or maintained. Outcome: Completed/Met Goal: Neurologic status will improve Outcome: Completed/Met Goal: Ability to maintain intracranial pressure will improve Outcome: Completed/Met   Problem: Skin Integrity: Goal: Demonstration of wound healing without infection will improve Outcome: Completed/Met

## 2019-03-21 NOTE — TOC Initial Note (Signed)
Transition of Care [facility] - Initial/Assessment Note    Patient Details  Name: [name] MRN: [id number] Date of Birth: Feb 18, 1941  Transition of [facility]) CM/SW Contact:    Oretha Milch, LCSW Phone Number: 03/21/2019, 11:51 AM  Clinical:  CSW met with patient to discuss aftercare plan. CSW noted patient was open to home health and preferred [facility]. CSW contacted [facility] on 03/21/2019 and noted they will be out to the patient's home Monday or Tuesday. CSW arranged home health PT and noted per patient's report she had an Therapist, sports and MD that will begin home visits with her starting in a week or two through a healthcare plan with [employer]. CSW is signing off as patient's TOC needs are met. Please re-consult for future social work needs.                    Expected Discharge Plan: [place] Barriers to Discharge: No Barriers Identified   Patient Goals and CMS Choice Patient states their goals for this hospitalization and ongoing recovery are:: "Just go back home really." CMS Medicare.gov Compare Post Acute Care list provided to:: Patient Choice offered to / list presented to : Patient  Expected Discharge Plan and Services Expected Discharge Plan: [place] Choice: [place] arrangements for the past 2 months: Single Family Home Expected Discharge Date: 03/21/19                         HH Arranged: PT HH Agency: [facility] Date HH Agency Contacted: 03/21/19 Time HH Agency Contacted: 68 Representative spoke with at [place]: Sharmon Revere  Prior Living Arrangements/Services Living arrangements for the past 2 months: [place] with:: Adult Children Patient language and need for interpreter reviewed:: No Do you feel safe going back to the place where you live?: Yes      Need for Family Participation in Patient Care: No (Comment) Care giver support system in place?:  Yes (comment) Current home services: Home PT Criminal Activity/Legal Involvement Pertinent to Current Situation/Hospitalization: No - Comment as needed  Activities of Daily Living Home Assistive Devices/Equipment: Oxygen ADL Screening (condition at time of admission) Patient's cognitive ability adequate to safely complete daily activities?: Yes Is the patient deaf or have difficulty hearing?: No Does the patient have difficulty seeing, even when wearing glasses/contacts?: No Does the patient have difficulty concentrating, remembering, or making decisions?: No Patient able to express need for assistance with ADLs?: Yes Does the patient have difficulty dressing or bathing?: No Independently performs ADLs?: Yes (appropriate for developmental age) Does the patient have difficulty walking or climbing stairs?: Yes Weakness of Legs: None Weakness of Arms/Hands: None  Permission Sought/Granted Permission sought to share information with : Facility Art therapist granted to share information with : Yes, Verbal Permission Granted     Permission granted to share info w AGENCY: [facility]  Permission granted to share info w Relationship: Home health provider     Emotional Assessment Appearance:: Appears stated age Attitude/Demeanor/Rapport: Engaged Affect (typically observed): Accepting Orientation: : Oriented to Self, Oriented to Place, Oriented to  Time, Oriented to Situation Alcohol / Substance Use: Not Applicable Psych Involvement: No (comment)  Admission diagnosis:  Brain tumor Patient Active Problem List   Diagnosis Date Noted  . Brain tumor ([place]) 03/18/2019  . Essential hypertension 03/12/2019  . Brain metastases ([place]) 03/06/2019  .  Cytotoxic brain edema ([place]) 03/04/2019  . ICH (intracerebral hemorrhage) (H[facility]) - L parietal lobe 03/03/2019  . Osteoporosis 05/16/2018  . S/P ORIF (open reduction internal fixation) fracture right patella 03/18/18 03/26/2018  .  Cellulitis, wound, post-operative 03/22/2018  . Fracture of right patella   . Normocytic anemia   . Esophageal dysphagia 11/27/2017  . Heme positive stool 11/27/2017  . Iron deficiency anemia 12/05/2016  . SIADH (syndrome of inappropriate ADH production) ([place])   . Polypharmacy 09/14/2016  . Muscle weakness (generalized)   . Syncope 09/10/2016  . Rib pain on left side   . Hypersomnia 02/16/2016  . Acute bronchitis 02/16/2016  . Exertional dyspnea 12/20/2015  . HLD (hyperlipidemia) 11/23/2014  . Change in bowel habits 11/10/2014  . Abdominal pain, left lower quadrant 11/10/2014  . Emphysema lung ([place]) 11/08/2014  . Helicobacter pylori gastritis 07/30/2014  . Acute on chronic respiratory failure with hypoxia ([place]) 04/10/2014  . Wrist fracture 04/09/2014  . 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 T12 burst fracture ([place]) 01/13/2014  . Constipation 12/14/2013  . Acute respiratory failure with hypoxia ([place]) 12/14/2013  . COPD (chronic obstructive pulmonary disease) ([place]) 12/14/2013  . Compression fracture of thoracic vertebra ([place]) 12/14/2013  . Hypoxia 12/13/2013  . Compression fracture 12/13/2013  . Diastolic dysfunction   . 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 AAA (abdominal aortic aneurysm) without rupture ([place]) 02/26/2012  . Dyspepsia 02/11/2012  . Aneurysm ([place]) 02/11/2012  . Trigeminal neuralgia   . Epigastric pain 02/21/2011  . Dysphagia 02/21/2011  . 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 HELICOBACTER PYLORI GASTRITIS, HX OF 12/30/2009  . CONTRAST DYE ALLERGY 12/30/2009   PCP:  [name], MD Pharmacy:   [place], [place] [address] [place] [address] Phone: [phone] Fax: [phone]     Social Determinants of Health (SDOH) Interventions    Readmission Risk Interventions No flowsheet data found.

## 2019-03-21 NOTE — Progress Notes (Signed)
CSW arranged [facility] home health for patient for [facility] PT. CSW notes services will likely begin on Monday or Tuesday at patient's apartment. Please re-consult for future social work needs.  Lamonte Richer, LCSW, [place] Worker II [phone]

## 2019-03-21 NOTE — Progress Notes (Signed)
Discharge instructions reviewed with patient.  These included the following:  Prescriptions, activity level, incision care, pain management, follow up appointments, when to call the MD, S&S of stroke / hemorrhage,  Etc.  Patient discharged via private vehicle driven by son.  Will be going to her residence with [facility] as set up by SW.  Escorted by nurse tech via wheelchair to exit where she was assisted to vehicle with minimum assist transfer.  Patient transported with own portable oxygen at 2 LPM.

## 2019-03-21 NOTE — Discharge Summary (Signed)
Physician Discharge Summary  Patient ID: [name] MRN: [id number] DOB/AGE: 78-Mar-1942 78 y.o.  Admit date: 03/18/2019 Discharge date: 03/21/2019  Admission Diagnoses:  Brain met  Discharge Diagnoses:  Same Active Problems:   Brain metastases ([place])   Brain tumor [facility])   Discharged Condition: Stable  Hospital Course:  [name] is a 78 y.o. female who was admitted for the below procedure. There were no post operative complications. At time of discharge, pain was well controlled, ambulating with Pt/OT, tolerating po, voiding normal. Ready for discharge.  Treatments: Surgery -  1. Stereotactic left parietal craniotomy for resection of tumor 2. Use of intraoperative microscope for microdissection  Discharge Exam: Blood pressure 104/70, pulse 65, temperature 98.2 F (36.8 C), temperature source Oral, resp. rate 18, height _0  (1.651 m), weight 57 kg, SpO2 96 %. Awake, alert, oriented Speech with mild word finding difficulty CN grossly intact 5/5 BUE/BLE Wound dressing with smal amount of dried blood. No active drainage.  Disposition: Discharge disposition: 01-Home or Self Care       Discharge Instructions    Call MD for:  difficulty breathing, headache or visual disturbances   Complete by: As directed    Call MD for:  persistant dizziness or light-headedness   Complete by: As directed    Call MD for:  redness, tenderness, or signs of infection (pain, swelling, redness, odor or green/yellow discharge around incision site)   Complete by: As directed    Call MD for:  severe uncontrolled pain   Complete by: As directed    Call MD for:  temperature >100.4   Complete by: As directed    Diet general   Complete by: As directed    Driving Restrictions   Complete by: As directed    Do not drive until given clearance.   Increase activity slowly   Complete by: As directed    Lifting restrictions   Complete by: As directed    Do not lift anything >10lbs. Avoid  bending and twisting in awkward positions. Avoid bending at the back.   May shower / Bathe   Complete by: As directed    In 24 hours. Okay to wash wound with warm soapy water. Avoid scrubbing the wound. Pat dry.   Remove dressing in 24 hours   Complete by: As directed      Allergies as of 03/21/2019      Reactions   Ciprofloxacin Hcl Hives   Sulfonamide Derivatives Hives, Rash   Iohexol Other (See Comments)   UNSPECIFIED REACTION  Desc: Per [facility], pt is allergic to IV contrast, no type of reaction was available. Pt cannot remember but first reacted around 15 yrs ago from an IVP. Notes were date from 2009 at urology center., [place] Date: [id number]   Prozac [fluoxetine Hcl] Rash      Medication List    TAKE these medications   acetaminophen 325 MG tablet Commonly known as: TYLENOL Take 650 mg by mouth every 6 (six) hours as needed for moderate pain or headache.   ALPRAZolam 0.25 MG tablet Commonly known as: XANAX Take 1 tablet (0.25 mg total) by mouth 2 (two) times daily as needed for anxiety.   atorvastatin 40 MG tablet Commonly known as: LIPITOR Take 1 tablet (40 mg total) by mouth daily at 6 PM.   citalopram 40 MG tablet Commonly known as: CELEXA Take 0.5 tablets (20 mg total) by mouth at bedtime.   denosumab 60 MG/ML Sosy injection Commonly known as:  Prolia Inject 60 mg into the skin every 6 (six) months.   ferrous sulfate 325 (65 FE) MG tablet Take 325 mg by mouth daily with breakfast.   fluticasone 50 MCG/ACT nasal spray Commonly known as: FLONASE Place 2 sprays into both nostrils daily.   levETIRAcetam 500 MG tablet Commonly known as: Keppra Take 1 tablet (500 mg total) by mouth 2 (two) times daily.   levothyroxine 50 MCG tablet Commonly known as: SYNTHROID Take 1 tablet (50 mcg total) by mouth daily before breakfast.   methylPREDNISolone 4 MG Tbpk tablet Commonly known as: Medrol Take according to package insert   multivitamin with minerals  Tabs tablet Take 1 tablet by mouth daily.   ondansetron 4 MG disintegrating tablet Commonly known as: Zofran ODT Take 1 tablet (4 mg total) by mouth every 4 (four) hours as needed for nausea or vomiting.   oxyCODONE-acetaminophen 5-325 MG tablet Commonly known as: Percocet Take 1 tablet by mouth every 4 (four) hours as needed for severe pain.   OXYGEN Inhale 2 L into the lungs continuous.   pantoprazole 40 MG tablet Commonly known as: PROTONIX Take 1 tablet (40 mg total) by mouth daily before breakfast.   senna-docusate 8.6-50 MG tablet Commonly known as: Senokot-S Take 1 tablet by mouth at bedtime as needed for mild constipation.   umeclidinium-vilanterol 62.5-25 MCG/INH Aepb Commonly known as: Anoro Ellipta Inhale 1 puff into the lungs daily.      Follow-up Information    Consuella Lose, MD. Schedule an appointment as soon as possible for a visit in 2 week(s).   Specialty: Neurosurgery Contact information: [address] [place] [address] [place] [address] [phone]           Signed: Traci Sermon 03/21/2019, 9:04 AM

## 2019-03-22 NOTE — Progress Notes (Signed)
  Radiation Oncology         [phone] ________________________________  Stereotactic Treatment Procedure Note  Name: BETTYLEE FEIG MRN: [id number]  Date: 03/17/2019  DOB: [birth year]  SPECIAL TREATMENT PROCEDURE    ICD-10-CM   1. Brain metastases ([place])  C79.31     3D TREATMENT PLANNING AND DOSIMETRY:  The patient's radiation plan was reviewed and approved by neurosurgery and radiation oncology prior to treatment.  It showed 3-dimensional radiation distributions overlaid onto the planning CT/MRI image set.  The [facility] for the target structures as well as the organs at risk were reviewed. The documentation of the 3D plan and dosimetry are filed in the radiation oncology EMR.  NARRATIVE:  ANNI HOCEVAR was brought to the TrueBeam stereotactic radiation treatment machine and placed supine on the CT couch. The head frame was applied, and the patient was set up for stereotactic radiosurgery.  Neurosurgery was present for the set-up and delivery  SIMULATION VERIFICATION:  In the couch zero-angle position, the patient underwent Exactrac imaging using the Brainlab system with orthogonal KV images.  These were carefully aligned and repeated to confirm treatment position for each of the isocenters.  The Exactrac snap film verification was repeated at each couch angle.  PROCEDURE: Laqueisha S Woodhams received stereotactic radiosurgery to the following targets: PTV1 Post Lt Parietal 44mm 18Gy PTV2 Lt Cerebellar 106mm  20Gy PTV3 Rt Temp 21mm 20Gy PTV4 Post Lt Insular 52mm  20 Gy PTV5 Lt Frontal Gyrus 15mm  20Gy targets were treated using 6 Rapid Arc VMAT Beams to a prescription dose of 18-20 Gy.  ExacTrac registration was performed for each couch angle.  The 131.4% isodose line was prescribed.  6 MV X-rays were delivered in the flattening filter free beam mode.  STEREOTACTIC TREATMENT MANAGEMENT:  Following delivery, the patient was transported to nursing in stable condition and monitored for possible acute  effects.  Vital signs were recorded BP 123/77 (BP Location: Left Arm, Patient Position: Sitting)   Pulse 78   Temp 98.9 F (37.2 C) (Oral)   Resp 20   SpO2 96% . The patient tolerated treatment without significant acute effects, and was discharged to home in stable condition.    PLAN: Proceed to resection of the largest lesion tomorrow, and then follow-up in one month.  ________________________________  Sheral Apley. Tammi Klippel, M.D.

## 2019-03-25 ENCOUNTER — Ambulatory Visit: Payer: Self-pay | Admitting: Orthopedic Surgery

## 2019-03-25 DIAGNOSIS — Z90710 Acquired absence of both cervix and uterus: Secondary | ICD-10-CM | POA: Diagnosis not present

## 2019-03-25 DIAGNOSIS — D6489 Other specified anemias: Secondary | ICD-10-CM | POA: Diagnosis not present

## 2019-03-25 DIAGNOSIS — J449 Chronic obstructive pulmonary disease, unspecified: Secondary | ICD-10-CM | POA: Diagnosis not present

## 2019-03-25 DIAGNOSIS — Z9851 Tubal ligation status: Secondary | ICD-10-CM | POA: Diagnosis not present

## 2019-03-25 DIAGNOSIS — Z9981 Dependence on supplemental oxygen: Secondary | ICD-10-CM | POA: Diagnosis not present

## 2019-03-25 DIAGNOSIS — R1310 Dysphagia, unspecified: Secondary | ICD-10-CM | POA: Diagnosis not present

## 2019-03-25 DIAGNOSIS — Z483 Aftercare following surgery for neoplasm: Secondary | ICD-10-CM | POA: Diagnosis not present

## 2019-03-25 DIAGNOSIS — Z9049 Acquired absence of other specified parts of digestive tract: Secondary | ICD-10-CM | POA: Diagnosis not present

## 2019-03-25 DIAGNOSIS — Z87891 Personal history of nicotine dependence: Secondary | ICD-10-CM | POA: Diagnosis not present

## 2019-03-25 DIAGNOSIS — E222 Syndrome of inappropriate secretion of antidiuretic hormone: Secondary | ICD-10-CM | POA: Diagnosis not present

## 2019-03-25 DIAGNOSIS — K5710 Diverticulosis of small intestine without perforation or abscess without bleeding: Secondary | ICD-10-CM | POA: Diagnosis not present

## 2019-03-25 DIAGNOSIS — F419 Anxiety disorder, unspecified: Secondary | ICD-10-CM | POA: Diagnosis not present

## 2019-03-25 DIAGNOSIS — K273 Acute peptic ulcer, site unspecified, without hemorrhage or perforation: Secondary | ICD-10-CM | POA: Diagnosis not present

## 2019-03-25 DIAGNOSIS — K588 Other irritable bowel syndrome: Secondary | ICD-10-CM | POA: Diagnosis not present

## 2019-03-25 DIAGNOSIS — F329 Major depressive disorder, single episode, unspecified: Secondary | ICD-10-CM | POA: Diagnosis not present

## 2019-03-25 DIAGNOSIS — Z85118 Personal history of other malignant neoplasm of bronchus and lung: Secondary | ICD-10-CM | POA: Diagnosis not present

## 2019-03-25 DIAGNOSIS — E039 Hypothyroidism, unspecified: Secondary | ICD-10-CM | POA: Diagnosis not present

## 2019-03-25 DIAGNOSIS — Z8781 Personal history of (healed) traumatic fracture: Secondary | ICD-10-CM | POA: Diagnosis not present

## 2019-03-25 DIAGNOSIS — Z9849 Cataract extraction status, unspecified eye: Secondary | ICD-10-CM | POA: Diagnosis not present

## 2019-03-25 DIAGNOSIS — C7931 Secondary malignant neoplasm of brain: Secondary | ICD-10-CM | POA: Diagnosis not present

## 2019-04-09 DIAGNOSIS — H5213 Myopia, bilateral: Secondary | ICD-10-CM | POA: Diagnosis not present

## 2019-04-09 DIAGNOSIS — H524 Presbyopia: Secondary | ICD-10-CM | POA: Diagnosis not present

## 2019-04-09 DIAGNOSIS — Z961 Presence of intraocular lens: Secondary | ICD-10-CM | POA: Diagnosis not present

## 2019-04-16 ENCOUNTER — Encounter: Payer: Self-pay | Admitting: Family Medicine

## 2019-04-17 ENCOUNTER — Encounter: Payer: Self-pay | Admitting: Family Medicine

## 2019-04-17 DIAGNOSIS — C7931 Secondary malignant neoplasm of brain: Secondary | ICD-10-CM | POA: Insufficient documentation

## 2019-04-19 ENCOUNTER — Emergency Department (HOSPITAL_COMMUNITY): Payer: PPO

## 2019-04-19 ENCOUNTER — Emergency Department (HOSPITAL_COMMUNITY)
Admission: EM | Admit: 2019-04-19 | Discharge: 2019-04-19 | Disposition: A | Discharge status: Home / Self Care | Payer: PPO | Attending: Emergency Medicine | Admitting: Emergency Medicine

## 2019-04-19 ENCOUNTER — Other Ambulatory Visit: Payer: Self-pay

## 2019-04-19 ENCOUNTER — Encounter (HOSPITAL_COMMUNITY): Payer: Self-pay | Admitting: *Deleted

## 2019-04-19 DIAGNOSIS — R0602 Shortness of breath: Secondary | ICD-10-CM | POA: Diagnosis not present

## 2019-04-19 DIAGNOSIS — R069 Unspecified abnormalities of breathing: Secondary | ICD-10-CM | POA: Diagnosis not present

## 2019-04-19 DIAGNOSIS — Z87891 Personal history of nicotine dependence: Secondary | ICD-10-CM | POA: Diagnosis not present

## 2019-04-19 DIAGNOSIS — J984 Other disorders of lung: Secondary | ICD-10-CM | POA: Diagnosis not present

## 2019-04-19 DIAGNOSIS — R0902 Hypoxemia: Secondary | ICD-10-CM | POA: Insufficient documentation

## 2019-04-19 DIAGNOSIS — R079 Chest pain, unspecified: Secondary | ICD-10-CM | POA: Diagnosis not present

## 2019-04-19 DIAGNOSIS — Z79899 Other long term (current) drug therapy: Secondary | ICD-10-CM | POA: Diagnosis not present

## 2019-04-19 DIAGNOSIS — Z85118 Personal history of other malignant neoplasm of bronchus and lung: Secondary | ICD-10-CM | POA: Diagnosis not present

## 2019-04-19 DIAGNOSIS — J449 Chronic obstructive pulmonary disease, unspecified: Secondary | ICD-10-CM | POA: Diagnosis not present

## 2019-04-19 DIAGNOSIS — Z209 Contact with and (suspected) exposure to unspecified communicable disease: Secondary | ICD-10-CM | POA: Diagnosis not present

## 2019-04-19 LAB — COMPREHENSIVE METABOLIC PANEL
ALT: 15 U/L (ref 0–44)
AST: 18 U/L (ref 15–41)
Albumin: 4.6 g/dL (ref 3.5–5.0)
Alkaline Phosphatase: 59 U/L (ref 38–126)
Anion gap: 10 (ref 5–15)
BUN: 13 mg/dL (ref 8–23)
CO2: 28 mmol/L (ref 22–32)
Calcium: 9.7 mg/dL (ref 8.9–10.3)
Chloride: 98 mmol/L (ref 98–111)
Creatinine, Ser: 0.44 mg/dL (ref 0.44–1.00)
GFR calc Af Amer: >60 mL/min (ref >60)
GFR calc non Af Amer: >60 mL/min (ref >60)
Glucose, Bld: 96 mg/dL (ref 70–99)
Potassium: 3.8 mmol/L (ref 3.5–5.1)
Sodium: 136 mmol/L (ref 135–145)
Total Bilirubin: 0.3 mg/dL (ref 0.3–1.2)
Total Protein: 7.2 g/dL (ref 6.5–8.1)

## 2019-04-19 LAB — CBC WITH DIFFERENTIAL/PLATELET
Abs Immature Granulocytes: 0.01 10*3/uL (ref 0.00–0.07)
Basophils Absolute: 0 10*3/uL (ref 0.0–0.1)
Basophils Relative: 0 %
Eosinophils Absolute: 0.3 10*3/uL (ref 0.0–0.5)
Eosinophils Relative: 4 %
HCT: 40 % (ref 36.0–46.0)
Hemoglobin: 13.2 g/dL (ref 12.0–15.0)
Immature Granulocytes: 0 %
Lymphocytes Relative: 17 %
Lymphs Abs: 1.3 10*3/uL (ref 0.7–4.0)
MCH: 29.8 pg (ref 26.0–34.0)
MCHC: 33 g/dL (ref 30.0–36.0)
MCV: 90.3 fL (ref 80.0–100.0)
Monocytes Absolute: 0.5 10*3/uL (ref 0.1–1.0)
Monocytes Relative: 7 %
Neutro Abs: 5.2 10*3/uL (ref 1.7–7.7)
Neutrophils Relative %: 72 %
Platelets: 321 10*3/uL (ref 150–400)
RBC: 4.43 MIL/uL (ref 3.87–5.11)
RDW: 12.5 % (ref 11.5–15.5)
WBC: 7.2 10*3/uL (ref 4.0–10.5)
nRBC: 0 % (ref 0.0–0.2)

## 2019-04-19 LAB — APTT: aPTT: 38 seconds — ABNORMAL HIGH (ref 24–36)

## 2019-04-19 LAB — PROTIME-INR
INR: 0.9 (ref 0.8–1.2)
Prothrombin Time: 12.3 seconds (ref 11.4–15.2)

## 2019-04-19 MED ORDER — DIPHENHYDRAMINE HCL 50 MG/ML IJ SOLN: 50.0000 mg | Freq: Once | INTRAMUSCULAR | Status: DC

## 2019-04-19 MED ORDER — DOXYCYCLINE HYCLATE 100 MG PO CAPS: 100.0000 mg | ORAL_CAPSULE | Freq: Two times a day (BID) | ORAL | 0 refills | Status: AC

## 2019-04-19 MED ORDER — ONDANSETRON HCL 4 MG/2ML IJ SOLN
4.0000 mg | Freq: Once | INTRAMUSCULAR | Status: AC
  Administered 2019-04-19: 4 mg via INTRAVENOUS
  Filled 2019-04-19: qty 2

## 2019-04-19 MED ORDER — IOHEXOL 350 MG/ML SOLN
75.0000 mL | Freq: Once | INTRAVENOUS | Status: AC | PRN
  Administered 2019-04-19: 75 mL via INTRAVENOUS

## 2019-04-19 MED ORDER — DIPHENHYDRAMINE HCL 50 MG/ML IJ SOLN
50.0000 mg | Freq: Once | INTRAMUSCULAR | Status: AC
  Administered 2019-04-19: 50 mg via INTRAVENOUS
  Filled 2019-04-19: qty 1

## 2019-04-19 MED ORDER — DOXYCYCLINE HYCLATE 100 MG PO TABS
100.0000 mg | ORAL_TABLET | Freq: Once | ORAL | Status: AC
  Administered 2019-04-19: 100 mg via ORAL
  Filled 2019-04-19: qty 1

## 2019-04-19 MED ORDER — HYDROCORTISONE NA SUCCINATE PF 250 MG IJ SOLR
200.0000 mg | Freq: Once | INTRAMUSCULAR | Status: AC
  Administered 2019-04-19: 06:00:00 200 mg via INTRAVENOUS
  Filled 2019-04-19: qty 200

## 2019-04-19 NOTE — ED Provider Notes (Signed)
Blood pressure 134/79, pulse 92, temperature 98.2 F (36.8 C), temperature source Oral, resp. rate (!) 23, height 5\' 5"  (1.651 m), weight 54.4 kg, SpO2 (!) 85 %.  Assuming care from Dr. Tomi Bamberger.  In short, [name] is a 78 y.o. female with a chief complaint of Shortness of Breath .  Refer to the original H&P for additional details.  The current plan of care is to f/u on CTA and reassess.   EKG Interpretation  Date/Time:  Sunday April 19 2019 05:18:55 EDT Ventricular Rate:  92 PR Interval:    QRS Duration: 115 QT Interval:  352 QTC Calculation: 436 R Axis:   -40 Text Interpretation:  Sinus rhythm Nonspecific IVCD with LAD Anterior infarct, old Nonspecific T abnormalities, lateral leads Electrode noise No significant change since last tracing 03 Mar 2019 Confirmed by Knapp, Iva ([id number]) on 04/19/2019 7:02:31 AM       12 :25 PM  CT scan of the chest resulted after pretreatment.  No PE.  Some concern for developing pneumonitis at the bases.  No other acute findings.  Patient is feeling well overall and would like to try and go back to her home oxygen.  Will cover with doxycycline for possible developing pneumonitis.   01:00 PM  Patient turned down to her home oxygen requirement.  She is satting well with no distress.  Plan for discharge home and have close PCP follow-up.  Discussed ED return precautions in detail.  Patient in agreement with plan and feeling comfortable with discharge.    Margette Fast, MD 04/19/19 1313

## 2019-04-19 NOTE — Discharge Instructions (Signed)
You were seen in the ED today with low oxygen.  We found some developing bronchitis/pneumonitis on your CT.  Your lab work is normal.  I am starting antibiotics.  You may need to increase your home oxygen to 3 L temporarily and then return back down to 2.  Please call your primary care doctor tomorrow to schedule the next available appointment.  Return to the emergency department if you feel any increased shortness of breath, chest pain, fatigue, or other sudden worsening symptoms.

## 2019-04-19 NOTE — ED Triage Notes (Signed)
Pt arrived to er by [facility] with sudden onset of sob that started 30 minutes prior to arrival in er, pulse ox upon ems arrival to pt was 78% on 3 liters of oxygen at home, pulse ox upon arrival to er 90% on 4lpm, pt denies any pain, n/v, has hx of copd and lung cancer.

## 2019-04-19 NOTE — ED Notes (Signed)
Patient being discharged. Family called and discharge instructions given to family and patient. Per patient's request. Patient waiting for ride.

## 2019-04-19 NOTE — ED Notes (Signed)
Pt states that she does not want to be resuscitated nor does she want to be put on life support. This was verified by Rip Harbour, RN.

## 2019-04-19 NOTE — ED Provider Notes (Signed)
[facility] EMERGENCY DEPARTMENT Provider Note   CSN: [id number] Arrival date & time: 04/19/19  0509  Time seen 5:20 AM  History   Chief Complaint Chief Complaint  Patient presents with  . Shortness of Breath    HPI Ruth ZYONA PETTAWAY is a 78 y.o. female.     HPI patient presents via EMS from home.  Patient is normally on 2 L/min nasal cannula oxygen and EMS reports her pulse ox was 78%.  They put her on 4 L and her pulse ox improved to 90%.  Patient states she felt like something was going on yesterday, July 11 but she cannot tell me what that means.  She states she woke up at 4 AM acutely short of breath.  She denies wheezing, fever, or cough but states she does cough up some clear mucus sometimes.  She states this is different from her COPD.  Please note patient tells me that when I asked her questions is in her chart.  Patient states she is quit smoking.  Patient was discharged from the hospital March 21, 2019 where she had a left parietal craniotomy done for resection of a brain tumor which was found to be brain metastasis consistent with pulmonary adenocarcinoma.  PCP [name], MD   Past Medical History:  Diagnosis Date  . 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 Allergic rhinitis   . 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 Anxiety disorder   . Aortic aneurysm ([place])   . Brain metastasis ([place])    lung cancer s/p resection 7/20  . Complication of anesthesia 2011   bloodpressure dropped during colonoscopy, not problems since  . Compression fracture   . COPD (chronic obstructive pulmonary disease) ([place])   . Depression   . Diastolic dysfunction   . Diverticulosis   . Dysphagia   . Emphysema lung ([place]) 11/08/2014  . Headache   . Hiatal hernia   . History of kidney stones   . Hypothyroidism   . IBS (irritable bowel syndrome)   . Iron deficiency anemia due to chronic blood loss 12/05/2016  . On home O2 12/15/2013   chronic hypoxia  . Pneumonia   . PUD (peptic ulcer disease)    5yrs  . Shortness of breath    with exertion  . SIADH (syndrome of inappropriate ADH production) ([place])   . Trigeminal neuralgia     Patient Active Problem List   Diagnosis Date Noted  . Brain metastasis ([place])   . Brain tumor ([place]) 03/18/2019  . Essential hypertension 03/12/2019  . Brain metastases ([place]) 03/06/2019  . Cytotoxic brain edema ([place]) 03/04/2019  . ICH (intracerebral hemorrhage) ([facility]) - L parietal lobe 03/03/2019  . Osteoporosis 05/16/2018  . S/P ORIF (open reduction internal fixation) fracture right patella 03/18/18 03/26/2018  . Cellulitis, wound, post-operative 03/22/2018  . Fracture of right patella   . Normocytic anemia   . Esophageal dysphagia 11/27/2017  . Heme positive stool 11/27/2017  . Iron deficiency anemia 12/05/2016  . SIADH (syndrome of inappropriate ADH production) ([place])   . Polypharmacy 09/14/2016  . Muscle weakness (generalized)   . Syncope 09/10/2016  . Rib pain on left side   . Hypersomnia 02/16/2016  . Acute bronchitis 02/16/2016  . Exertional dyspnea 12/20/2015  . HLD (hyperlipidemia) 11/23/2014  . Change in bowel habits 11/10/2014  . Abdominal pain, left lower quadrant 11/10/2014  . Emphysema lung ([place]) 11/08/2014  . Helicobacter  pylori gastritis 07/30/2014  . Acute on chronic respiratory failure with hypoxia ([place]) 04/10/2014  . Wrist fracture 04/09/2014  . 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 T12 burst fracture ([place]) 01/13/2014  . Constipation 12/14/2013  . Acute respiratory failure with hypoxia ([place]) 12/14/2013  . COPD (chronic obstructive pulmonary disease) ([place]) 12/14/2013  . Compression fracture of thoracic vertebra ([place]) 12/14/2013  . Hypoxia 12/13/2013  . Compression fracture 12/13/2013  . Diastolic dysfunction   . 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 AAA (abdominal aortic aneurysm) without rupture ([place])  02/26/2012  . Dyspepsia 02/11/2012  . Aneurysm ([place]) 02/11/2012  . Trigeminal neuralgia   . Epigastric pain 02/21/2011  . Dysphagia 02/21/2011  . Adenocarcinoma of lung ([place]) 12/07/2010  . HELICOBACTER PYLORI GASTRITIS, HX OF 12/30/2009  . CONTRAST DYE ALLERGY 12/30/2009    Past Surgical History:  Procedure Laterality Date  . ABDOMINAL HYSTERECTOMY    . APPLICATION OF CRANIAL NAVIGATION N/A 03/18/2019   Procedure: APPLICATION OF CRANIAL NAVIGATION;  Surgeon: Consuella Lose, MD;  Location: [place];  Service: Neurosurgery;  Laterality: N/A;  . BACK SURGERY  January 13, 2014  . CATARACT EXTRACTION    . CATARACT EXTRACTION W/PHACO  09/02/2012   Procedure: CATARACT EXTRACTION PHACO AND INTRAOCULAR LENS PLACEMENT (IOC);  Surgeon: Elta Guadeloupe T. Gershon Crane, MD;  Location: [facility];  Service: Ophthalmology;  Laterality: Left;  CDE:10.35  . CHOLECYSTECTOMY    . 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 COLONOSCOPY N/A 11/23/2014   Dr. Oneida Alar: moderate diverticulosis, hemorrhoids, redundant colon. next TCS in 10-15 years.   . CRANIOTOMY Left 03/18/2019   Procedure: Left stereotactic craniotomy for tumor resection;  Surgeon: Consuella Lose, MD;  Location: [place];  Service: Neurosurgery;  Laterality: Left;  Left stereotactic craniotomy for tumor resection  . ENDARTERECTOMY Right 08/21/2013   Procedure: RIGHT CAROTID ANEURYSM RESECTION;  Surgeon: Rosetta Posner, MD;  Location: [place];  Service: Vascular;  Laterality: Right;  . ESOPHAGEAL DILATION  02/24/2018   Procedure: ESOPHAGEAL DILATION;  Surgeon: Danie Binder, MD;  [facility];  Service: Endoscopy;;  . ESOPHAGOGASTRODUODENOSCOPY  11/13/2009   w/dilation to 31mm, gastric ulceration (H.Pylori) s/p treatment  . ESOPHAGOGASTRODUODENOSCOPY  11/21/2009   distal esophageal web, gastritis  . ESOPHAGOGASTRODUODENOSCOPY  03/14/12   JKK:[id number] in the distal esophagus/Mild gastritis/small HH. + H.pylori, prescribed  Pylera. Finished treatment.   . ESOPHAGOGASTRODUODENOSCOPY N/A 12/13/2017   Procedure: ESOPHAGOGASTRODUODENOSCOPY (EGD);  Surgeon: Danie Binder, MD;  [facility];  Service: Endoscopy;  Laterality: N/A;  8:30am  . ESOPHAGOGASTRODUODENOSCOPY N/A 02/24/2018   Procedure: ESOPHAGOGASTRODUODENOSCOPY (EGD);  Surgeon: Danie Binder, MD;  [facility];  Service: Endoscopy;  Laterality: N/A;  11:00am  . fatty tumor removal from lt groin    . FRACTURE SURGERY Left    hand and left ring finger  . GIVENS CAPSULE STUDY N/A 12/26/2017   Procedure: GIVENS CAPSULE STUDY;  Surgeon: Danie Binder, MD;  [facility];  Service: Endoscopy;  Laterality: N/A;  7:30am  . GIVENS CAPSULE STUDY N/A 02/24/2018   Procedure: GIVENS CAPSULE STUDY;  Surgeon: Danie Binder, MD;  [facility];  Service: Endoscopy;  Laterality: N/A;  . KNEE SURGERY     patella tendon repair june 2019 [place]  . LUNG CANCER SURGERY  12/2010   Left VATS, minithoracotomy, LLL superior segmentectomy  . ORIF PATELLA Right 03/18/2018   Procedure:  OPEN REDUCTION INTERNAL (ORIF) FIXATION RIGHT PATELLA;  Surgeon: Carole Civil, MD;  Location: [facility];  Service: Orthopedics;  Laterality: Right;  . ORIF WRIST FRACTURE Left 04/09/2014   Procedure: OPEN REDUCTION INTERNAL FIXATION (ORIF) WRIST FRACTURE;  Surgeon: Renette Butters, MD;  Location: [place];  Service: Orthopedics;  Laterality: Left;  . PERCUTANEOUS PINNING Left 04/09/2014   Procedure: PERCUTANEOUS PINNING EXTREMITY;  Surgeon: Renette Butters, MD;  Location: [place] [facility];  Service: Orthopedics;  Laterality: Left;  . SAVORY DILATION N/A 12/13/2017   Procedure: SAVORY DILATION;  Surgeon: Danie Binder, MD;  [facility];  Service: Endoscopy;  Laterality: N/A;  . TUBAL LIGATION    . vocal cord surgery  02/06/2011   laryngoscopy with bilateral vocal cord Radiesse injection for vocal cord paralysis  . YAG LASER APPLICATION Left 38/75/6433    Procedure: YAG LASER APPLICATION;  Surgeon: Rutherford Guys, MD;  Location: [facility];  Service: Ophthalmology;  Laterality: Left;     OB History    Gravida  2   Para  2   Term  2   Preterm      AB      Living  2     SAB      TAB      Ectopic      Multiple      Live Births               Home Medications    Prior to Admission medications   Medication Sig Start Date End Date Taking? Authorizing Provider  acetaminophen (TYLENOL) 325 MG tablet Take 650 mg by mouth every 6 (six) hours as needed for moderate pain or headache.     [provider]  ALPRAZolam Duanne Moron) 0.25 MG tablet Take 1 tablet (0.25 mg total) by mouth 2 (two) times daily as needed for anxiety. 03/12/19   Donzetta Starch, NP  atorvastatin (LIPITOR) 40 MG tablet Take 1 tablet (40 mg total) by mouth daily at 6 PM. 03/12/19   Donzetta Starch, NP  citalopram (CELEXA) 40 MG tablet Take 0.5 tablets (20 mg total) by mouth at bedtime. 03/12/19   Donzetta Starch, NP  denosumab (PROLIA) 60 MG/ML SOSY injection Inject 60 mg into the skin every 6 (six) months. 12/30/18   [name], MD  ferrous sulfate 325 (65 FE) MG tablet Take 325 mg by mouth daily with breakfast.    [provider]  fluticasone (FLONASE) 50 MCG/ACT nasal spray Place 2 sprays into both nostrils daily. 12/30/18   [name], MD  levETIRAcetam (KEPPRA) 500 MG tablet Take 1 tablet (500 mg total) by mouth 2 (two) times daily. 03/21/19   Costella, Vista Mink, PA-C  levothyroxine (SYNTHROID) 50 MCG tablet Take 1 tablet (50 mcg total) by mouth daily before breakfast. 03/12/19   Donzetta Starch, NP  methylPREDNISolone (MEDROL) 4 MG TBPK tablet Take according to package insert 03/21/19   Costella, Vista Mink, PA-C  Multiple Vitamin (MULTIVITAMIN WITH MINERALS) TABS tablet Take 1 tablet by mouth daily.    [provider]  ondansetron (ZOFRAN ODT) 4 MG disintegrating tablet Take 1 tablet (4 mg total) by mouth every 4 (four) hours as needed for  nausea or vomiting. 03/12/19   Donzetta Starch, NP  oxyCODONE-acetaminophen (PERCOCET) 5-325 MG tablet Take 1 tablet by mouth every 4 (four) hours as needed for severe pain. 03/21/19 03/20/20  Costella, Vista Mink, PA-C  OXYGEN Inhale 2 L into the lungs  continuous.    [provider]  pantoprazole (PROTONIX) 40 MG tablet Take 1 tablet (40 mg total) by mouth daily before breakfast. 02/05/19   Annitta Needs, NP  senna-docusate (SENOKOT-S) 8.6-50 MG tablet Take 1 tablet by mouth at bedtime as needed for mild constipation. 03/12/19   Donzetta Starch, NP  umeclidinium-vilanterol (ANORO ELLIPTA) 62.5-25 MCG/INH AEPB Inhale 1 puff into the lungs daily. 06/20/18   [name], MD    Family History Family History  Problem Relation Age of Onset  . Pulmonary embolism Mother   . Heart attack Father   . Hypertension Father   . Liver cancer Sister   . Cancer Sister   . Cancer Brother   . Cancer Daughter   . Colon cancer Neg Hx     Social History Social History   Tobacco Use  . Smoking status: Former Smoker    Packs/day: 0.50    Years: 20.00    Pack years: 10.00    Types: Cigarettes    Quit date: 12/21/2010    Years since quitting: 8.3  . Smokeless tobacco: Never Used  . Tobacco comment: smoking cessation info given and reviewed   Substance Use Topics  . Alcohol use: Yes    Alcohol/week: 0.0 standard drinks    Comment: seldom  . Drug use: No  home oxygen   Allergies   Ciprofloxacin hcl, Sulfonamide derivatives, Iohexol, and Prozac [fluoxetine hcl]   Review of Systems Review of Systems  Unable to perform ROS: Other     Physical Exam Updated Vital Signs BP 134/79 (BP Location: Right Arm)   Pulse 92   Temp 98.2 F (36.8 C) (Oral)   Resp (!) 23   Ht 5\' 5"  (1.651 m)   Wt 54.4 kg   SpO2 (!) 85%   BMI 19.97 kg/m   Vital signs normal    Physical Exam Vitals signs and nursing note reviewed.  Constitutional:      Appearance: Normal appearance.     Comments: Thin  frail elderly female  HENT:     Head: Normocephalic and atraumatic.     Comments: Patient has a well-healing surgical scar in her left scalp.    Right Ear: External ear normal.     Left Ear: External ear normal.     Nose: Nose normal.     Mouth/Throat:     Mouth: Mucous membranes are dry.  Eyes:     Extraocular Movements: Extraocular movements intact.     Conjunctiva/sclera: Conjunctivae normal.     Pupils: Pupils are equal, round, and reactive to light.  Neck:     Musculoskeletal: Normal range of motion.  Cardiovascular:     Rate and Rhythm: Normal rate and regular rhythm.     Pulses: Normal pulses.  Pulmonary:     Effort: Pulmonary effort is normal. No respiratory distress.     Breath sounds: Normal breath sounds. No wheezing.  Musculoskeletal: Normal range of motion.  Skin:    General: Skin is warm and dry.     Findings: No erythema.  Neurological:     General: No focal deficit present.     Mental Status: She is alert and oriented to person, place, and time.     Cranial Nerves: No cranial nerve deficit.  Psychiatric:        Mood and Affect: Mood normal.        Behavior: Behavior normal.        Thought Content: Thought content normal.  ED Treatments / Results  Labs (all labs ordered are listed, but only abnormal results are displayed)  Results for orders placed or performed during the hospital encounter of 04/19/19  Comprehensive metabolic panel  Result Value Ref Range   Sodium 136 135 - 145 mmol/L   Potassium 3.8 3.5 - 5.1 mmol/L   Chloride 98 98 - 111 mmol/L   CO2 28 22 - 32 mmol/L   Glucose, Bld 96 70 - 99 mg/dL   BUN 13 8 - 23 mg/dL   Creatinine, Ser 0.44 0.44 - 1.00 mg/dL   Calcium 9.7 8.9 - 10.3 mg/dL   Total Protein 7.2 6.5 - 8.1 g/dL   Albumin 4.6 3.5 - 5.0 g/dL   AST 18 15 - 41 U/L   ALT 15 0 - 44 U/L   Alkaline Phosphatase 59 38 - 126 U/L   Total Bilirubin 0.3 0.3 - 1.2 mg/dL   GFR calc non Af Amer >60 >60 mL/min   GFR calc Af Amer >60 >60  mL/min   Anion gap 10 5 - 15  CBC with Differential  Result Value Ref Range   WBC 7.2 4.0 - 10.5 K/uL   RBC 4.43 3.87 - 5.11 MIL/uL   Hemoglobin 13.2 12.0 - 15.0 g/dL   HCT 40.0 36.0 - 46.0 %   MCV 90.3 80.0 - 100.0 fL   MCH 29.8 26.0 - 34.0 pg   MCHC 33.0 30.0 - 36.0 g/dL   RDW 12.5 11.5 - 15.5 %   Platelets 321 150 - 400 K/uL   nRBC 0.0 0.0 - 0.2 %   Neutrophils Relative % 72 %   Neutro Abs 5.2 1.7 - 7.7 K/uL   Lymphocytes Relative 17 %   Lymphs Abs 1.3 0.7 - 4.0 K/uL   Monocytes Relative 7 %   Monocytes Absolute 0.5 0.1 - 1.0 K/uL   Eosinophils Relative 4 %   Eosinophils Absolute 0.3 0.0 - 0.5 K/uL   Basophils Relative 0 %   Basophils Absolute 0.0 0.0 - 0.1 K/uL   Immature Granulocytes 0 %   Abs Immature Granulocytes 0.01 0.00 - 0.07 K/uL  APTT  Result Value Ref Range   aPTT 38 (H) 24 - 36 seconds  Protime-INR  Result Value Ref Range   Prothrombin Time 12.3 11.4 - 15.2 seconds   INR 0.9 0.8 - 1.2   Laboratory interpretation all normal except mildly elevated PTT   EKG EKG Interpretation  Date/Time:  Sunday April 19 2019 05:18:55 EDT Ventricular Rate:  92 PR Interval:    QRS Duration: 115 QT Interval:  352 QTC Calculation: 436 R Axis:   -40 Text Interpretation:  Sinus rhythm Nonspecific IVCD with LAD Anterior infarct, old Nonspecific T abnormalities, lateral leads Electrode noise No significant change since last tracing 03 Mar 2019 Confirmed by Rolland Porter [phone]) on 04/19/2019 7:02:31 AM   Radiology Dg Chest Portable 1 View  Result Date: 04/19/2019 CLINICAL DATA:  Pt states she has been sob onset throughout the night. Pt states she is on oxygen at home. Pt denies cp, fever, N/V or injury to chest. Pt has hx of COPD. EXAM: PORTABLE CHEST 1 VIEW COMPARISON:  Chest CT, 03/06/2019.  Chest radiograph, 10/19/2016. FINDINGS: Cardiac silhouette is normal in size. No mediastinal or hilar masses. Lungs are hyperexpanded. There are thickened bronchovascular markings.  Interstitial thickening is noted most evident in the lower lungs. There is linear opacities in both lung bases consistent with scarring or chronic atelectasis. Milder scarring is noted in  the apices. No evidence of pneumonia or pulmonary edema. No pleural effusion or pneumothorax. Skeletal structures are grossly intact. IMPRESSION: 1. No acute cardiopulmonary disease. 2. COPD with stable areas of lung scarring and stable basilar atelectasis and or scarring. Electronically Signed   By: Lajean Manes M.D.   On: 04/19/2019 05:52    Procedures Procedures (including critical care time)  Medications Ordered in ED Medications  diphenhydrAMINE (BENADRYL) injection 50 mg (has no administration in time range)  hydrocortisone sodium succinate (SOLU-CORTEF) injection 200 mg (200 mg Intravenous Given 04/19/19 1448)     Initial Impression / Assessment and Plan / ED Course  I have reviewed the triage vital signs and the nursing notes.  Pertinent labs & imaging results that were available during my care of the patient were reviewed by me and considered in my medical decision making (see chart for details).    Patient did not appear to be having a flareup of her COPD, she is not wheezing, she has no rales, concern is she did have recent surgery and may be not as active as usual.  CTA chest to look for PE or subclinical pneumonia was ordered however patient has a dye allergy.  The preop meds were ordered.  Please note patient has very strange affect.  When I ask her questions about why she is here tonight she keeps telling me "it is in the computer".  However when I start doing orders on her she was telling me all about her daughter and her other family and was talking very easily without getting short of breath.  Patient was turned over to Dr. Laverta Baltimore at change of shift to get her CTA results.    Final Clinical Impressions(s) / ED Diagnoses   Final diagnoses:  Shortness of breath  Hypoxia    Disposition  pending  Rolland Porter, MD, Barbette Or, MD 04/19/19 [phone]

## 2019-04-20 ENCOUNTER — Encounter: Payer: Self-pay | Admitting: Family Medicine

## 2019-04-21 ENCOUNTER — Encounter: Payer: Self-pay | Admitting: Family Medicine

## 2019-04-21 ENCOUNTER — Other Ambulatory Visit: Payer: Self-pay | Admitting: *Deleted

## 2019-04-21 MED ORDER — PROLIA 60 MG/ML ~~LOC~~ SOSY: 60.0000 mg | PREFILLED_SYRINGE | SUBCUTANEOUS | 0 refills | Status: DC

## 2019-04-23 ENCOUNTER — Telehealth: Payer: Self-pay | Admitting: Adult Health

## 2019-04-23 ENCOUNTER — Ambulatory Visit
Admission: RE | Admit: 2019-04-23 | Discharge: 2019-04-23 | Disposition: A | Discharge status: Home / Self Care | Payer: PPO | Source: Ambulatory Visit | Attending: Urology | Admitting: Urology

## 2019-04-23 DIAGNOSIS — C7931 Secondary malignant neoplasm of brain: Secondary | ICD-10-CM

## 2019-04-23 NOTE — Progress Notes (Signed)
Radiation Oncology         [phone] ________________________________  Name: TEEGHAN HAMMER MRN: [id number]  Date: 04/23/2019  DOB: 02-27-1941  Post Treatment Note  CC: [name], MD  [name], MD  Diagnosis:   78 y.o. 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  Interval Since Last Radiation:  5 weeks  03/17/19:  Pre-Op SRS brain in a single fraction to the following targets: PTV1 Post Lt Parietal 76mm 18Gy PTV2 Lt Cerebellar 35mm  20Gy PTV3 Rt Temp 80mm 20Gy PTV4 Post Lt Insular 50mm  20 Gy PTV5 Lt Frontal Gyrus 71mm  20Gy  Followed by stereotactic left parietal craniotomy for resection of tumor with Dr. Kathyrn Sheriff on 03/18/19.  Narrative:  I spoke with the patient's son, Quillian Quince to conduct the routine scheduled 1 month follow up visit via telephone to spare the patient unnecessary potential exposure in the healthcare setting during the current COVID-19 pandemic.  The patient was notified in advance and gave permission to proceed with this visit format.   Unfortunately, she does not take calls from numbers that she does not recognize and her son reports that she prefers to have messages left for her. She tolerated her radiation treatment very well without any ill side effects and went on to do very well with surgery as well.                     On review of systems, Quillian Quince states that she has been getting along very well with improved balance and mobility since completing treatment. She continues working with PT in her home and is back living at home independently.  She was recently evaluated/treated in the ED for shortness of breath and cough- diagnosed with bronchitis/possible pneumonia and started on antibiotics and has reported feeling better since starting antibiotics.   ALLERGIES:  is allergic to ciprofloxacin hcl; sulfonamide derivatives; iohexol; and prozac [fluoxetine hcl].  Meds: Current Outpatient Medications  Medication Sig  Dispense Refill   acetaminophen (TYLENOL) 325 MG tablet Take 650 mg by mouth every 6 (six) hours as needed for moderate pain or headache.      ALPRAZolam (XANAX) 0.25 MG tablet Take 1 tablet (0.25 mg total) by mouth 2 (two) times daily as needed for anxiety. 30 tablet 0   atorvastatin (LIPITOR) 40 MG tablet Take 1 tablet (40 mg total) by mouth daily at 6 PM.     citalopram (CELEXA) 40 MG tablet Take 0.5 tablets (20 mg total) by mouth at bedtime.     denosumab (PROLIA) 60 MG/ML SOSY injection Inject 60 mg into the skin every 6 (six) months. 1 mL 0   doxycycline (VIBRAMYCIN) 100 MG capsule Take 1 capsule (100 mg total) by mouth 2 (two) times daily for 7 days. 14 capsule 0   ferrous sulfate 325 (65 FE) MG tablet Take 325 mg by mouth daily with breakfast.     fluticasone (FLONASE) 50 MCG/ACT nasal spray Place 2 sprays into both nostrils daily. 16 g 6   levETIRAcetam (KEPPRA) 500 MG tablet Take 1 tablet (500 mg total) by mouth 2 (two) times daily. (Patient taking differently: Take 500 mg by mouth at bedtime. ) 60 tablet 2   levothyroxine (SYNTHROID) 50 MCG tablet Take 1 tablet (50 mcg total) by mouth daily before breakfast.     methylPREDNISolone (MEDROL) 4 MG TBPK tablet Take according to package insert (Patient not taking: Reported on 04/19/2019) 21 tablet 0   Multiple Vitamin (MULTIVITAMIN  WITH MINERALS) TABS tablet Take 1 tablet by mouth daily.     ondansetron (ZOFRAN ODT) 4 MG disintegrating tablet Take 1 tablet (4 mg total) by mouth every 4 (four) hours as needed for nausea or vomiting.     oxyCODONE-acetaminophen (PERCOCET) 5-325 MG tablet Take 1 tablet by mouth every 4 (four) hours as needed for severe pain. (Patient not taking: Reported on 04/19/2019) 30 tablet 0   OXYGEN Inhale 2 L into the lungs continuous.     pantoprazole (PROTONIX) 40 MG tablet Take 1 tablet (40 mg total) by mouth daily before breakfast. 90 tablet 3   senna-docusate (SENOKOT-S) 8.6-50 MG tablet Take 1 tablet  by mouth at bedtime as needed for mild constipation.     umeclidinium-vilanterol (ANORO ELLIPTA) 62.5-25 MCG/INH AEPB Inhale 1 puff into the lungs daily. 90 each 3   No current facility-administered medications for this encounter.     Physical Findings:  vitals were not taken for this visit.   /Unable to assess due to telephone visit format.   Lab Findings: Lab Results  Component Value Date   WBC 7.2 04/19/2019   HGB 13.2 04/19/2019   HCT 40.0 04/19/2019   MCV 90.3 04/19/2019   PLT 321 04/19/2019     Radiographic Findings: Ct Angio Chest Pe W/cm &/or Wo Cm  Result Date: 04/19/2019 CLINICAL DATA:  Chest pain and hypoxia EXAM: CT ANGIOGRAPHY CHEST WITH CONTRAST TECHNIQUE: Multidetector CT imaging of the chest was performed using the standard protocol during bolus administration of intravenous contrast. Multiplanar CT image reconstructions and MIPs were obtained to evaluate the vascular anatomy. CONTRAST:  99mL OMNIPAQUE IOHEXOL 350 MG/ML SOLN COMPARISON:  November 06, 2018 FINDINGS: Cardiovascular: There is no demonstrable pulmonary embolus. There is no thoracic aortic aneurysm or dissection. The visualized great vessels appear unremarkable. There are foci of aortic atherosclerosis. There is a fairly small pericardial effusion. Mediastinum/Nodes: Visualized thyroid appears unremarkable. There is an aortopulmonary window lymph node measuring 1.6 x 1.7 cm. There are subcarinal lymph nodes, largest measuring 1.1 x 1.1 cm. There are several subcentimeter mediastinal lymph nodes as well. There is a left hilar lymph node measuring 9 x 9 mm, upper normal in size. No esophageal lesions are evident. Lungs/Pleura: There is centrilobular emphysematous change. There is scarring in both lower lobes. There is patchy consolidation with atelectasis in each posterior lung base. There is no evident pleural effusion. There is a 4 mm nodular opacity in the superior segment of the right lower lobe seen on axial  slice 68 series 6, stable. There is a degree of lower lobe bronchiectatic change, likely on traction basis. Note postoperative change in the left lower lobe region. Upper Abdomen: Visualized upper abdominal structures appear unremarkable. Musculoskeletal: Postoperative changes are noted in the lower thoracic and upper lumbar regions. There is a stable compression fracture of at T12 with retropulsion of bone into the canal, stable. Moderately severe collapse at L1 is stable. Marked collapse of the T6 vertebral body stable. Moderate collapse at T8 noted. No new bone lesions are evident. No chest wall lesions are appreciable. Review of the MIP images confirms the above findings. IMPRESSION: 1. No demonstrable pulmonary embolus. No thoracic aortic aneurysm or dissection. There is aortic atherosclerosis. 2.  Fairly small pericardial effusion evident. 3. Underlying emphysematous change. Areas of scarring in the lower lobes bilaterally with postoperative change in the left lower lobe. There is a combination of atelectasis and consolidation in each posterior basilar region. There is a degree of traction type  bronchiectatic change in the lung bases. 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 3. Several prominent lymph nodes may well be of reactive etiology given the parenchymal lung findings. 4. Multiple compression fractures in the lower thoracic and upper lumbar region with postoperative change in the lower thoracic and upper lumbar regions. No new bone lesions demonstrable. Aortic Atherosclerosis (ICD10-I70.0) and Emphysema (ICD10-J43.9). Electronically Signed   By: Lowella Grip III M.D.   On: 04/19/2019 12:03   Dg Chest Portable 1 View  Result Date: 04/19/2019 CLINICAL DATA:  Pt states she has been sob onset throughout the night. Pt states she is on oxygen at home. Pt denies cp, fever, N/V or injury to chest. Pt has hx of COPD. EXAM: PORTABLE CHEST 1 VIEW COMPARISON:  Chest CT,  03/06/2019.  Chest radiograph, 10/19/2016. FINDINGS: Cardiac silhouette is normal in size. No mediastinal or hilar masses. Lungs are hyperexpanded. There are thickened bronchovascular markings. Interstitial thickening is noted most evident in the lower lungs. There is linear opacities in both lung bases consistent with scarring or chronic atelectasis. Milder scarring is noted in the apices. No evidence of pneumonia or pulmonary edema. No pleural effusion or pneumothorax. Skeletal structures are grossly intact. IMPRESSION: 1. No acute cardiopulmonary disease. 2. COPD with stable areas of lung scarring and stable basilar atelectasis and or scarring. Electronically Signed   By: Lajean Manes M.D.   On: 04/19/2019 05:52    Impression/Plan: 1. 78 y.o. 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 I called and left a detailed message on the patient's home answering machine and requested that she return my call just to touch base.  I also informed her and her son, Quillian Quince of the plan to obtain a repeat MRI brain scan in 2 months to assess treatment response.  Pending those findings are stable, we will plan to move forward with serial MRI brain scans every 3 months for the first 2 years, to closely monitor for any disease recurrence or progression.  After 2 years, we will move to 6 month brain MRI scans pending her disease remains stable. She will follow up in the office or by telephone following each scan to review results and recommendations from our multidisciplinary brain conference.  She is advised to call with any questions or concerns in the interim.    Nicholos Johns, PA-C

## 2019-04-23 NOTE — Telephone Encounter (Signed)
°  Due to current COVID 19 pandemic, our office is severely reducing in office visits until further notice, in order to minimize the risk to our patients and healthcare providers.    Patient returned call and said she'd be happy to do a Video Visit for her follow up.   Pt understands that although there may be some limitations with this type of visit, we will take all precautions to reduce any security or privacy concerns.  Pt understands that this will be treated like an in office visit and we will file with pt's insurance, and there may be a patient responsible charge related to this service.

## 2019-04-23 NOTE — Telephone Encounter (Signed)
I called patient and LVM regarding her 7/20 appt. Patient will either need to agree to a virtual visit for this 10:15 am appt or reschedule for an in office appt. Advised in VM that [facility] only has virtual visits Monday morning. Requested patient call back. Office contact info provided.

## 2019-04-24 ENCOUNTER — Encounter: Payer: Self-pay | Admitting: Family Medicine

## 2019-04-24 ENCOUNTER — Other Ambulatory Visit: Payer: Self-pay

## 2019-04-24 ENCOUNTER — Ambulatory Visit (INDEPENDENT_AMBULATORY_CARE_PROVIDER_SITE_OTHER): Payer: PPO | Admitting: Family Medicine

## 2019-04-24 DIAGNOSIS — R1310 Dysphagia, unspecified: Secondary | ICD-10-CM | POA: Diagnosis not present

## 2019-04-24 DIAGNOSIS — K5710 Diverticulosis of small intestine without perforation or abscess without bleeding: Secondary | ICD-10-CM | POA: Diagnosis not present

## 2019-04-24 DIAGNOSIS — Z85118 Personal history of other malignant neoplasm of bronchus and lung: Secondary | ICD-10-CM | POA: Diagnosis not present

## 2019-04-24 DIAGNOSIS — J310 Chronic rhinitis: Secondary | ICD-10-CM

## 2019-04-24 DIAGNOSIS — K588 Other irritable bowel syndrome: Secondary | ICD-10-CM | POA: Diagnosis not present

## 2019-04-24 DIAGNOSIS — C7931 Secondary malignant neoplasm of brain: Secondary | ICD-10-CM

## 2019-04-24 DIAGNOSIS — D6489 Other specified anemias: Secondary | ICD-10-CM | POA: Diagnosis not present

## 2019-04-24 DIAGNOSIS — J329 Chronic sinusitis, unspecified: Secondary | ICD-10-CM | POA: Diagnosis not present

## 2019-04-24 DIAGNOSIS — Z9049 Acquired absence of other specified parts of digestive tract: Secondary | ICD-10-CM | POA: Diagnosis not present

## 2019-04-24 DIAGNOSIS — Z87891 Personal history of nicotine dependence: Secondary | ICD-10-CM | POA: Diagnosis not present

## 2019-04-24 DIAGNOSIS — E222 Syndrome of inappropriate secretion of antidiuretic hormone: Secondary | ICD-10-CM | POA: Diagnosis not present

## 2019-04-24 DIAGNOSIS — Z90710 Acquired absence of both cervix and uterus: Secondary | ICD-10-CM | POA: Diagnosis not present

## 2019-04-24 DIAGNOSIS — Z09 Encounter for follow-up examination after completed treatment for conditions other than malignant neoplasm: Secondary | ICD-10-CM | POA: Diagnosis not present

## 2019-04-24 DIAGNOSIS — Z483 Aftercare following surgery for neoplasm: Secondary | ICD-10-CM | POA: Diagnosis not present

## 2019-04-24 DIAGNOSIS — E039 Hypothyroidism, unspecified: Secondary | ICD-10-CM | POA: Diagnosis not present

## 2019-04-24 DIAGNOSIS — J449 Chronic obstructive pulmonary disease, unspecified: Secondary | ICD-10-CM

## 2019-04-24 DIAGNOSIS — Z9851 Tubal ligation status: Secondary | ICD-10-CM | POA: Diagnosis not present

## 2019-04-24 DIAGNOSIS — K273 Acute peptic ulcer, site unspecified, without hemorrhage or perforation: Secondary | ICD-10-CM | POA: Diagnosis not present

## 2019-04-24 DIAGNOSIS — Z8781 Personal history of (healed) traumatic fracture: Secondary | ICD-10-CM | POA: Diagnosis not present

## 2019-04-24 DIAGNOSIS — F329 Major depressive disorder, single episode, unspecified: Secondary | ICD-10-CM | POA: Diagnosis not present

## 2019-04-24 DIAGNOSIS — Z9981 Dependence on supplemental oxygen: Secondary | ICD-10-CM | POA: Diagnosis not present

## 2019-04-24 DIAGNOSIS — Z9849 Cataract extraction status, unspecified eye: Secondary | ICD-10-CM | POA: Diagnosis not present

## 2019-04-24 DIAGNOSIS — F419 Anxiety disorder, unspecified: Secondary | ICD-10-CM | POA: Diagnosis not present

## 2019-04-24 MED ORDER — PREDNISONE 20 MG PO TABS: ORAL_TABLET | ORAL | 0 refills | Status: DC

## 2019-04-24 MED ORDER — CEFDINIR 300 MG PO CAPS: 300.0000 mg | ORAL_CAPSULE | Freq: Two times a day (BID) | ORAL | 0 refills | Status: DC

## 2019-04-24 NOTE — Progress Notes (Signed)
Subjective:    Patient ID: [name], female    DOB: 05-10-1941, 78 y.o.   MRN: [id number]  HPI  Patient has been in the hospital numerous times since the end of May.  Patient was admitted with right-sided weakness and aphasia and was found to have a intracranial hemorrhage ultimately due to metastasis from her previous lung cancer.  I have copied relevant portions of the discharge summary and included below for my reference:  [name] a 78 y.o.femalewith history of trigeminal neuralgia, on home O2, IBS, dysphagia, diastolic dysfunction, COPD, aortic aneurysm and anxiety. Per notes patient presented to the [facility] as a code stroke. Patient was aphasic and unable to provide history at that time. Son was at the bedside and assisted with a history. By report, neighbor who checks on the patient found her slightly confused yesterday 03/02/2019 around 1700 hrs (LKW). Today 03/03/2019 whenthe neighbor went back over,the patientwas unable to communicate and was noted to have right-sided weakness and subsequently EMS was called. Son also noted that the patient had been complaining of headaches since Thursday. Patient underwent a CT while in the ED and was found to have a left parietal-occipital hemorrhage. Neurosurgery was consulted and determined thatthere was no indication for neurosurgical intervention. [facility]  During the Neurology assessment, thepatient has continueddifficulty following commands. She has nonsensical, fluent speechand is unable to give any history.  Premorbid modified Rankin scale (mRS):0 NIH stroke score of 8 ICH Score:0  HOSPITAL COURSE Ms.[name] a 78 y.o.femalewith history of lung cancer, trigeminal neuralgia, on home O2, IBS, dysphagia, diastolic dysfunction, COPD, aortic aneurysm and anxietypresenting with left parietal/occipital  ICH.Possible etiology is HTN, yet given her cancer history MRI with contrast shows multiple enhancing metastatic lesions with hemorrhage into one such lesion as a cause of her intracerebral hemorrhage. Neuro Oncology consult.  ICH - left parietal lobar hematoma - Etiologycould be due tounderlying metastatic hemorrhage given history of lung cancer and MRI confirmedbrain metastases lesions  CT head left parietal and occipital ICH  MRI head -stable IPH ~25cc volume; some surrounding vasogenic edema, no evidence of typical CAA  MRA head- neg for underlying vascular process  MRI brain with contrast : At least 5 enhancing metastatic lesions with the largest one in the left posterior parietal at the location of the hemorrhage  Repeat MRI brain with and without contrast per [facility] protocol5 intracranial mets w/ hemorrhagic L parieto-occipital lesion w/ mid to mod edema. 24mm L parietal sulcus c/w aneurysm.  2D Echo - EF 60 to 65%  [employer] Virus 2 neg  [id number]  HgbA1c5.1  No thinners or ASA prior to admit; currently not indicated  Therapy recommendations: SNF  Disposition: SNF - initially denied by insurance, approved after peer to peer  Brain metastasis  History of lung cancer  MRI w/gad - 5 small/punctate enhancement underlying metastatic lesions.   Repeat MRI brain with and without contrast per [facility] protocolconfirmed 5 intracranial mets  Neuro Onc consulted with plan for biopsy on Monday, 03/16/2019.   Neurosurgery Kathyrn Sheriff) consulted and plans for stereotactic radiosurgery Tues 6/9 followed by surgical resection of the large L parietal-occipital lesion on 6/10   CT chest/abdomen/pelvis showed right more than left airspace disease, no metastasis  Neurosurgeon ok for d/c and return next week  Hypertension  Stable  BP goal < 160 in hopsital  BP goal at discharge normotensive  Hyperlipidemia  No statin PTA  LDL 155,goal <70  on lipitor 40   Continue  lipitor as outpt  Other Stroke Risk Factors  Advanced age  53 Cigarette smoker  Previous right carotid aneurysm resection with Dr. early in 2014   Other Problems  3.4 cm abdominal aortic aneurysm is identified. Recommend followup by ultrasound in 3 years.   COPD on home oxygen  Trigeminal neuralgia  Since discharge from the hospital at that time she has underwent treatment for the small lesions with primary stereotactic radiosurgery with preoperative SRS followed by surgical resection for the larger left parietal lesion.   Patient has since undergone the resection of the large left parietal lesion under the excellent care of her neurosurgeon.  She has done remarkably well since discharge from the hospital all things considered.  She is about to complete her antiseizure medication.  She believes that they will discontinue this at her next follow-up.  The current plan as best I can tell from her notes include MRIs of the brain every 3 months for 2 years.  After 2 years they will switch to an every 31-month schedule to follow for any recurrence.  There has been no evidence of any other metastasis throughout the body in the work-up prior to surgery.  However Saturday, the patient awoke feeling tightness in her chest.  Pulse oximetry was 79% on oxygen.  She was rushed to the hospital where a CT angiogram of the chest showed no PE but it did show interstitial pneumonitis.  She was started on doxycycline for possible bronchitis in early pneumonia.  She has done well since discharge from the hospital.  She states that she is now up to 94% on 2 L.  She states that her breathing has improved.  However she continues to have constant sinus drainage and rhinorrhea despite taking antihistamines and Flonase and being on the doxycycline.  She states that her sinus drainage has been plaguing her off and on ever since the spring this year.  She denies any pain in her cheekbones or in her forehead.  She does  have some left-sided facial pain secondary to trigeminal neuralgia that she is suffered with for 15 years. Past Medical History:  Diagnosis Date  . Adenocarcinoma of lung ([place]) 12/2010   left lung/surg only  . Allergic rhinitis   . 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 Anxiety disorder   . Aortic aneurysm ([place])   . Brain metastasis ([place])    lung cancer s/p resection 7/20  . Complication of anesthesia 2011   bloodpressure dropped during colonoscopy, not problems since  . Compression fracture   . COPD (chronic obstructive pulmonary disease) ([place])   . Depression   . Diastolic dysfunction   . Diverticulosis   . Dysphagia   . Emphysema lung ([place]) 11/08/2014  . Headache   . Hiatal hernia   . History of kidney stones   . Hypothyroidism   . IBS (irritable bowel syndrome)   . Iron deficiency anemia due to chronic blood loss 12/05/2016  . On home O2 12/15/2013   chronic hypoxia  . Pneumonia   . PUD (peptic ulcer disease)    35yrs  . Shortness of breath    with exertion  . SIADH (syndrome of inappropriate ADH production) ([place])   . Trigeminal neuralgia    Past Surgical History:  Procedure Laterality Date  . ABDOMINAL HYSTERECTOMY    . APPLICATION OF CRANIAL NAVIGATION N/A 03/18/2019   Procedure: APPLICATION OF  CRANIAL NAVIGATION;  Surgeon: Consuella Lose, MD;  Location: [place];  Service: Neurosurgery;  Laterality: N/A;  . BACK SURGERY  January 13, 2014  . CATARACT EXTRACTION    . CATARACT EXTRACTION W/PHACO  09/02/2012   Procedure: CATARACT EXTRACTION PHACO AND INTRAOCULAR LENS PLACEMENT (IOC);  Surgeon: Elta Guadeloupe T. Gershon Crane, MD;  [facility];  Service: Ophthalmology;  Laterality: Left;  CDE:10.35  . CHOLECYSTECTOMY    . 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 COLONOSCOPY N/A 11/23/2014   Dr. Oneida Alar: moderate diverticulosis, hemorrhoids, redundant colon. next TCS in 10-15 years.   . CRANIOTOMY Left 03/18/2019    Procedure: Left stereotactic craniotomy for tumor resection;  Surgeon: Consuella Lose, MD;  Location: [place];  Service: Neurosurgery;  Laterality: Left;  Left stereotactic craniotomy for tumor resection  . ENDARTERECTOMY Right 08/21/2013   Procedure: RIGHT CAROTID ANEURYSM RESECTION;  Surgeon: Rosetta Posner, MD;  Location: [place];  Service: Vascular;  Laterality: Right;  . ESOPHAGEAL DILATION  02/24/2018   Procedure: ESOPHAGEAL DILATION;  Surgeon: Danie Binder, MD;  [facility];  Service: Endoscopy;;  . ESOPHAGOGASTRODUODENOSCOPY  11/13/2009   w/dilation to 37mm, gastric ulceration (H.Pylori) s/p treatment  . ESOPHAGOGASTRODUODENOSCOPY  11/21/2009   distal esophageal web, gastritis  . ESOPHAGOGASTRODUODENOSCOPY  03/14/12   JSH:FWYOVZCHY in the distal esophagus/Mild gastritis/small HH. + H.pylori, prescribed Pylera. Finished treatment.   . ESOPHAGOGASTRODUODENOSCOPY N/A 12/13/2017   Procedure: ESOPHAGOGASTRODUODENOSCOPY (EGD);  Surgeon: Danie Binder, MD;  [facility];  Service: Endoscopy;  Laterality: N/A;  8:30am  . ESOPHAGOGASTRODUODENOSCOPY N/A 02/24/2018   Procedure: ESOPHAGOGASTRODUODENOSCOPY (EGD);  Surgeon: Danie Binder, MD;  [facility];  Service: Endoscopy;  Laterality: N/A;  11:00am  . fatty tumor removal from lt groin    . FRACTURE SURGERY Left    hand and left ring finger  . GIVENS CAPSULE STUDY N/A 12/26/2017   Procedure: GIVENS CAPSULE STUDY;  Surgeon: Danie Binder, MD;  [facility];  Service: Endoscopy;  Laterality: N/A;  7:30am  . GIVENS CAPSULE STUDY N/A 02/24/2018   Procedure: GIVENS CAPSULE STUDY;  Surgeon: Danie Binder, MD;  [facility];  Service: Endoscopy;  Laterality: N/A;  . KNEE SURGERY     patella tendon repair june 2019 [place]  . LUNG CANCER SURGERY  12/2010   Left VATS, minithoracotomy, LLL superior segmentectomy  . ORIF PATELLA Right 03/18/2018   Procedure: OPEN REDUCTION INTERNAL (ORIF) FIXATION  RIGHT PATELLA;  Surgeon: Carole Civil, MD;  [facility];  Service: Orthopedics;  Laterality: Right;  . ORIF WRIST FRACTURE Left 04/09/2014   Procedure: OPEN REDUCTION INTERNAL FIXATION (ORIF) WRIST FRACTURE;  Surgeon: Renette Butters, MD;  Location: [place];  Service: Orthopedics;  Laterality: Left;  . PERCUTANEOUS PINNING Left 04/09/2014   Procedure: PERCUTANEOUS PINNING EXTREMITY;  Surgeon: Renette Butters, MD;  Location: [place];  Service: Orthopedics;  Laterality: Left;  . SAVORY DILATION N/A 12/13/2017   Procedure: SAVORY DILATION;  Surgeon: Danie Binder, MD;  [facility];  Service: Endoscopy;  Laterality: N/A;  . TUBAL LIGATION    . vocal cord surgery  02/06/2011   laryngoscopy with bilateral vocal cord Radiesse injection for vocal cord paralysis  . YAG LASER APPLICATION Left 85/11/7739   Procedure: YAG LASER APPLICATION;  Surgeon: Rutherford Guys, MD;  [facility];  Service: Ophthalmology;  Laterality: Left;   Current Outpatient Medications on File Prior to  Visit  Medication Sig Dispense Refill  . acetaminophen (TYLENOL) 325 MG tablet Take 650 mg by mouth every 6 (six) hours as needed for moderate pain or headache.     . ALPRAZolam (XANAX) 0.25 MG tablet Take 1 tablet (0.25 mg total) by mouth 2 (two) times daily as needed for anxiety. 30 tablet 0  . atorvastatin (LIPITOR) 40 MG tablet Take 1 tablet (40 mg total) by mouth daily at 6 PM.    . citalopram (CELEXA) 40 MG tablet Take 0.5 tablets (20 mg total) by mouth at bedtime.    Marland Kitchen denosumab (PROLIA) 60 MG/ML SOSY injection Inject 60 mg into the skin every 6 (six) months. 1 mL 0  . doxycycline (VIBRAMYCIN) 100 MG capsule Take 1 capsule (100 mg total) by mouth 2 (two) times daily for 7 days. 14 capsule 0  . ferrous sulfate 325 (65 FE) MG tablet Take 325 mg by mouth daily with breakfast.    . fluticasone (FLONASE) 50 MCG/ACT nasal spray Place 2 sprays into both nostrils daily. 16 g 6  . levETIRAcetam (KEPPRA) 500 MG tablet  Take 1 tablet (500 mg total) by mouth 2 (two) times daily. (Patient taking differently: Take 500 mg by mouth at bedtime. ) 60 tablet 2  . levothyroxine (SYNTHROID) 50 MCG tablet Take 1 tablet (50 mcg total) by mouth daily before breakfast.    . methylPREDNISolone (MEDROL) 4 MG TBPK tablet Take according to package insert (Patient not taking: Reported on 04/19/2019) 21 tablet 0  . Multiple Vitamin (MULTIVITAMIN WITH MINERALS) TABS tablet Take 1 tablet by mouth daily.    . ondansetron (ZOFRAN ODT) 4 MG disintegrating tablet Take 1 tablet (4 mg total) by mouth every 4 (four) hours as needed for nausea or vomiting.    Marland Kitchen oxyCODONE-acetaminophen (PERCOCET) 5-325 MG tablet Take 1 tablet by mouth every 4 (four) hours as needed for severe pain. (Patient not taking: Reported on 04/19/2019) 30 tablet 0  . OXYGEN Inhale 2 L into the lungs continuous.    . pantoprazole (PROTONIX) 40 MG tablet Take 1 tablet (40 mg total) by mouth daily before breakfast. 90 tablet 3  . senna-docusate (SENOKOT-S) 8.6-50 MG tablet Take 1 tablet by mouth at bedtime as needed for mild constipation.    Marland Kitchen umeclidinium-vilanterol (ANORO ELLIPTA) 62.5-25 MCG/INH AEPB Inhale 1 puff into the lungs daily. 90 each 3   No current facility-administered medications on file prior to visit.    Allergies  Allergen Reactions  . Ciprofloxacin Hcl Hives  . Sulfonamide Derivatives Hives and Rash  . Iohexol Other (See Comments)    UNSPECIFIED REACTION  Desc: Per [facility], pt is allergic to IV contrast, no type of reaction was available. Pt cannot remember but first reacted around 15 yrs ago from an IVP. Notes were date from 2009 at urology center., [place] Date: [id number]   . Prozac [Fluoxetine Hcl] Rash   Social History   Socioeconomic History  . Marital status: Divorced    Spouse name: Not on file  . Number of children: Not on file  . Years of education: Not on file  . Highest education level: Not on file  Occupational History  . Not  on file  Social Needs  . Financial resource strain: Somewhat hard  . Food insecurity    Worry: Never true    Inability: Never true  . Transportation needs    Medical: No    Non-medical: No  Tobacco Use  . Smoking status: Former Smoker  Packs/day: 0.50    Years: 20.00    Pack years: 10.00    Types: Cigarettes    Quit date: 12/21/2010    Years since quitting: 8.3  . Smokeless tobacco: Never Used  . Tobacco comment: smoking cessation info given and reviewed   Substance and Sexual Activity  . Alcohol use: Yes    Alcohol/week: 0.0 standard drinks    Comment: seldom  . Drug use: No  . Sexual activity: Yes    Birth control/protection: Surgical  Lifestyle  . Physical activity    Days per week: 0 days    Minutes per session: 0 min  . Stress: Not at all  Relationships  . Social Herbalist on phone: Once a week    Gets together: Never    Attends religious service: Never    Active member of club or organization: No    Attends meetings of clubs or organizations: Never    Relationship status: Divorced  . Intimate partner violence    Fear of current or ex partner: No    Emotionally abused: No    Physically abused: No    Forced sexual activity: No  Other Topics Concern  . Not on file  Social History Narrative  . Not on file    Review of Systems  All other systems reviewed and are negative.      Objective:   Physical Exam   Physical exam could not be performed today as patient was seen over the telephone     Assessment & Plan:  The primary encounter diagnosis was Hospital discharge follow-up. Diagnoses of Brain metastasis ([place]), Rhinosinusitis, and COPD (chronic obstructive pulmonary disease) with chronic bronchitis ([place]) were also pertinent to this visit. Spent more than 30 minutes today with the patient.  She agreed to be seen over the telephone in fact she preferred to be seen over the telephone given her current medical situation.  Phone call began at  2:00.  Phone call ended at 230.  Patient seems to be doing remarkably well.  Despite being diagnosed with metastatic lung cancer, they have been successfully able to remove the small lesions with stereotactic radiosurgery.  They have successfully been able to remove the left parietal lesion surgically.  Current treatment plan does not involve any further chemotherapy per the patient's report.  Instead she will have MRIs of the brain every 3 months for 2 years and then every 6 months thereafter.  We will monitor this along with her specialist.  She does seem to be suffering from COPD and also rhinosinusitis.  I have asked the patient to discontinue doxycycline and switch to Omnicef 300 mg p.o. twice daily for 10 days and also use a prednisone taper pack and then will reassess via telephone in 1 week to see if her sinus inflammation and drainage has improved.

## 2019-04-25 ENCOUNTER — Other Ambulatory Visit: Payer: Self-pay | Admitting: Family Medicine

## 2019-04-27 ENCOUNTER — Telehealth (INDEPENDENT_AMBULATORY_CARE_PROVIDER_SITE_OTHER): Payer: PPO | Admitting: Adult Health

## 2019-04-27 ENCOUNTER — Telehealth: Payer: Self-pay | Admitting: Adult Health

## 2019-04-27 ENCOUNTER — Encounter: Payer: Self-pay | Admitting: Adult Health

## 2019-04-27 ENCOUNTER — Inpatient Hospital Stay: Payer: PPO | Admitting: Adult Health

## 2019-04-27 ENCOUNTER — Other Ambulatory Visit: Payer: Self-pay

## 2019-04-27 ENCOUNTER — Encounter: Payer: Self-pay | Admitting: Family Medicine

## 2019-04-27 DIAGNOSIS — E785 Hyperlipidemia, unspecified: Secondary | ICD-10-CM

## 2019-04-27 DIAGNOSIS — I10 Essential (primary) hypertension: Secondary | ICD-10-CM | POA: Diagnosis not present

## 2019-04-27 DIAGNOSIS — I611 Nontraumatic intracerebral hemorrhage in hemisphere, cortical: Secondary | ICD-10-CM

## 2019-04-27 DIAGNOSIS — C7931 Secondary malignant neoplasm of brain: Secondary | ICD-10-CM | POA: Diagnosis not present

## 2019-04-27 NOTE — Telephone Encounter (Signed)
Call patient back in regards to concerns with virtual visit.  She had difficulty connecting for telemedicine visit via MyChart despite attempts and trying to instruct.  Offered rescheduling for an office visit as she is a new patient to this office but due to current health concerns and current pandemic, patient preferred telephone visit.  Discussion regarding billing insurance for telephone visit and limitations.  Patient consented and proceeded with telephone visit for hospital follow-up.

## 2019-04-27 NOTE — Telephone Encounter (Addendum)
error 

## 2019-04-27 NOTE — Progress Notes (Signed)
[facility] [address] [place].  [address] [phone]       HOSPITAL FOLLOW UP NOTE  [name] Date of Birth:  1941/08/23 Medical Record Number:  [id number]   Reason for Referral:  hospital stroke follow up  I connected with  Malachi Paradise on 04/27/19 by telephone located at [facility]. She was initially scheduled for telemedicine visit via MyChart but due to connection difficulties, proceeded visit with telephone call with patient's consent.  Offered rescheduling for in office visit but patient declined due to COVID-19 and her current health condition.  Verified that I am speaking with the correct person using two identifiers who was located in her own home.   I discussed the limitations of evaluation and management by telemedicine. The patient expressed understanding and agreed to proceed.    HPI: ELWILLIE FELLA was initially scheduled today to telemedicine visit via my chart for hospital follow-up regarding left parietal lobar hematoma likely secondary to metastatic hemorrhage with confirmation of brain metastases lesions on MRI on 03/03/2019 but due to connection difficulties, visit transition to telephone visit.  History obtained from patient and chart review. Reviewed all radiology images and labs personally.  RuthJailene S Brannis a 78 y.o.femalewith history of lung cancer, trigeminal neuralgia, on home O2, IBS, dysphagia, diastolic dysfunction, COPD, aortic aneurysm and anxietywho [facility] with confusion, difficulty communicating and right-sided weakness.  CT head showed left parietal occipital hemorrhage.  Neurosurgery consulted and no indication for intervention at that time.  Transfer to [facility] for further evaluation and management.  MRI head wo contrast showed stable IPH with mild surrounding vasogenic edema but no evidence of typical CAA.  MRA head negative for underlying vascular  process.  MRI brain with contrast showed multiple enhancing metastatic lesions with hemorrhage into left parietal occipital lesion with mild to moderate edema and 3 mm left parietal sulcus c/w aneurysm.  Etiology of ICH likely due to underlying metastatic hemorrhage with history of lung cancer and MRI confirming brain metastases lesions.  2D echo unremarkable.  LDL 155 and A1c 5.1.  No indication for aspirin with recent ICH and no prior underlying history of cardiovascular condition.  Neuro-oncology consulted for brain metastases and plan to undergo biopsy on 03/16/2019 along with stereotactic radiosurgery 03/17/2019 followed by surgical resection of large left parieto-occipital lesion on 03/18/2019 with follow-up outpatient.  HTN stable.  Initiated atorvastatin 40 mg daily for HLD management secondary stroke prevention.  Other stroke risk factors include advanced age, previous tobacco use and previous right carotid aneurysm resection with Dr. early in 2014.  Other active problems include 3.4 cm abdominal aortic aneurysm and recommended follow-up by ultrasound in 3 years, COPD on home oxygen and trigeminal neuralgia.  Discharge to SNF for ongoing PT/OT/ST in stable condition.  Residual deficit of decreased peripheral vision -patient unsure if she had a decrease in vision prior to stroke.  Continues to follow with ophthalmology regularly.  Denies any other residual deficits. Completed speech and physical therapy -continue to use rolling walker for prior back condition Resection performed without complication Underwent left stereotactic craniotomy for tumor resection with Dr. Conchita Paris on 03/18/2019 without complication.  Continues to follow with oncology with plans of repeating MRI every 3 months for 2 years. She lives independently - maintains ADLs and majority of IADLs - she does not drive She has stopped atorvastatin as she did not have refills -patient request following up with Dr. Tanya Nones PCP  for need of  restarting Blood pressure typically checked daily which has been stable ranging SBP low 100s Recent ED evaluation for complaints of shortness of breath and cough with diagnosis of bronchitis/possible pneumonia and antibiotics initiated.  Endorses improvement since initiating antibiotics Denies new or worsening stroke/TIA symptoms.    ROS:   14 system review of systems performed and negative with exception of decreased vision  PMH:  Past Medical History:  Diagnosis Date  . 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 Allergic rhinitis   . 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HCC)    corrected by surgery 08/21/13  . Anxiety disorder   . Aortic aneurysm (HCC)   . Brain metastasis (HCC)    lung cancer s/p resection 7/20  . Complication of anesthesia 2011   bloodpressure dropped during colonoscopy, not problems since  . Compression fracture   . COPD (chronic obstructive pulmonary disease) (HCC)   . Depression   . Diastolic dysfunction   . Diverticulosis   . Dysphagia   . Emphysema lung (HCC) 11/08/2014  . Headache   . Hiatal hernia   . History of kidney stones   . Hypothyroidism   . IBS (irritable bowel syndrome)   . Iron deficiency anemia due to chronic blood loss 12/05/2016  . On home O2 12/15/2013   chronic hypoxia  . Pneumonia   . PUD (peptic ulcer disease)    9yrs  . Shortness of breath    with exertion  . SIADH (syndrome of inappropriate ADH production) (HCC)   . Trigeminal neuralgia     PSH:  Past Surgical History:  Procedure Laterality Date  . ABDOMINAL HYSTERECTOMY    . APPLICATION OF CRANIAL NAVIGATION N/A 03/18/2019   Procedure: APPLICATION OF CRANIAL NAVIGATION;  Surgeon: Lisbeth Renshaw, MD;  Location: [facility] OR;  Service: Neurosurgery;  Laterality: N/A;  . BACK SURGERY  January 13, 2014  . CATARACT EXTRACTION    . CATARACT EXTRACTION W/PHACO  09/02/2012   Procedure: CATARACT EXTRACTION PHACO AND INTRAOCULAR LENS PLACEMENT (IOC);  Surgeon: Loraine Leriche T.  Nile Riggs, MD;  Location: [facility] ORS;  Service: Ophthalmology;  Laterality: Left;  CDE:10.35  . CHOLECYSTECTOMY    . 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 COLONOSCOPY N/A 11/23/2014   Dr. Darrick Penna: moderate diverticulosis, hemorrhoids, redundant colon. next TCS in 10-15 years.   . CRANIOTOMY Left 03/18/2019   Procedure: Left stereotactic craniotomy for tumor resection;  Surgeon: Lisbeth Renshaw, MD;  Location: [facility] OR;  Service: Neurosurgery;  Laterality: Left;  Left stereotactic craniotomy for tumor resection  . ENDARTERECTOMY Right 08/21/2013   Procedure: RIGHT CAROTID ANEURYSM RESECTION;  Surgeon: Larina Earthly, MD;  Location: [facility] OR;  Service: Vascular;  Laterality: Right;  . ESOPHAGEAL DILATION  02/24/2018   Procedure: ESOPHAGEAL DILATION;  Surgeon: West Bali, MD;  Location: [facility];  Service: Endoscopy;;  . ESOPHAGOGASTRODUODENOSCOPY  11/13/2009   w/dilation to 14mm, gastric ulceration (H.Pylori) s/p treatment  . ESOPHAGOGASTRODUODENOSCOPY  11/21/2009   distal esophageal web, gastritis  . ESOPHAGOGASTRODUODENOSCOPY  03/14/12   WUJ:WJXBJYNWG in the distal esophagus/Mild gastritis/small HH. + H.pylori, prescribed Pylera. Finished treatment.   . ESOPHAGOGASTRODUODENOSCOPY N/A 12/13/2017   Procedure: ESOPHAGOGASTRODUODENOSCOPY (EGD);  Surgeon: West Bali, MD;  [facility];  Service: Endoscopy;  Laterality: N/A;  8:30am  . ESOPHAGOGASTRODUODENOSCOPY N/A 02/24/2018   Procedure: ESOPHAGOGASTRODUODENOSCOPY (EGD);  Surgeon: West Bali, MD;  [facility];  Service: Endoscopy;  Laterality:  N/A;  11:00am  . fatty tumor removal from lt groin    . FRACTURE SURGERY Left    hand and left ring finger  . GIVENS CAPSULE STUDY N/A 12/26/2017   Procedure: GIVENS CAPSULE STUDY;  Surgeon: West Bali, MD;  [facility];  Service: Endoscopy;  Laterality: N/A;  7:30am  . GIVENS CAPSULE STUDY N/A 02/24/2018   Procedure: GIVENS  CAPSULE STUDY;  Surgeon: West Bali, MD;  [facility];  Service: Endoscopy;  Laterality: N/A;  . KNEE SURGERY     patella tendon repair june 2019 [facility]  . LUNG CANCER SURGERY  12/2010   Left VATS, minithoracotomy, LLL superior segmentectomy  . ORIF PATELLA Right 03/18/2018   Procedure: OPEN REDUCTION INTERNAL (ORIF) FIXATION RIGHT PATELLA;  Surgeon: Vickki Hearing, MD;  Location: [facility];  Service: Orthopedics;  Laterality: Right;  . ORIF WRIST FRACTURE Left 04/09/2014   Procedure: OPEN REDUCTION INTERNAL FIXATION (ORIF) WRIST FRACTURE;  Surgeon: Sheral Apley, MD;  Location: [facility] OR;  Service: Orthopedics;  Laterality: Left;  . PERCUTANEOUS PINNING Left 04/09/2014   Procedure: PERCUTANEOUS PINNING EXTREMITY;  Surgeon: Sheral Apley, MD;  Location: [facility] OR;  Service: Orthopedics;  Laterality: Left;  . SAVORY DILATION N/A 12/13/2017   Procedure: SAVORY DILATION;  Surgeon: West Bali, MD;  [facility];  Service: Endoscopy;  Laterality: N/A;  . TUBAL LIGATION    . vocal cord surgery  02/06/2011   laryngoscopy with bilateral vocal cord Radiesse injection for vocal cord paralysis  . YAG LASER APPLICATION Left 09/20/2015   Procedure: YAG LASER APPLICATION;  Surgeon: Jethro Bolus, MD;  Location: [facility];  Service: Ophthalmology;  Laterality: Left;    Social History:  Social History   Socioeconomic History  . Marital status: Divorced    Spouse name: Not on file  . Number of children: Not on file  . Years of education: Not on file  . Highest education level: Not on file  Occupational History  . Not on file  Social Needs  . Financial resource strain: Somewhat hard  . Food insecurity    Worry: Never true    Inability: Never true  . Transportation needs    Medical: No    Non-medical: No  Tobacco Use  . Smoking status: Former Smoker    Packs/day: 0.50    Years: 20.00    Pack years: 10.00    Types: Cigarettes    Quit date: 12/21/2010    Years since  quitting: 8.3  . Smokeless tobacco: Never Used  . Tobacco comment: smoking cessation info given and reviewed   Substance and Sexual Activity  . Alcohol use: Yes    Alcohol/week: 0.0 standard drinks    Comment: seldom  . Drug use: No  . Sexual activity: Yes    Birth control/protection: Surgical  Lifestyle  . Physical activity    Days per week: 0 days    Minutes per session: 0 min  . Stress: Not at all  Relationships  . Social Musician on phone: Once a week    Gets together: Never    Attends religious service: Never    Active member of club or organization: No    Attends meetings of clubs or organizations: Never    Relationship status: Divorced  . Intimate partner violence    Fear of current or ex partner: No    Emotionally abused: No    Physically abused: No  Forced sexual activity: No  Other Topics Concern  . Not on file  Social History Narrative  . Not on file    Family History:  Family History  Problem Relation Age of Onset  . Pulmonary embolism Mother   . Heart attack Father   . Hypertension Father   . Liver cancer Sister   . Cancer Sister   . Cancer Brother   . Cancer Daughter   . Colon cancer Neg Hx     Medications:   Current Outpatient Medications on File Prior to Visit  Medication Sig Dispense Refill  . acetaminophen (TYLENOL) 325 MG tablet Take 650 mg by mouth every 6 (six) hours as needed for moderate pain or headache.     . ALPRAZolam (XANAX) 0.25 MG tablet Take 1 tablet (0.25 mg total) by mouth 2 (two) times daily as needed for anxiety. 30 tablet 0  . atorvastatin (LIPITOR) 40 MG tablet Take 1 tablet (40 mg total) by mouth daily at 6 PM.    . cefdinir (OMNICEF) 300 MG capsule Take 1 capsule (300 mg total) by mouth 2 (two) times daily. 20 capsule 0  . citalopram (CELEXA) 40 MG tablet Take 0.5 tablets (20 mg total) by mouth at bedtime.    Marland Kitchen denosumab (PROLIA) 60 MG/ML SOSY injection Inject 60 mg into the skin every 6 (six) months. 1 mL 0    . ferrous sulfate 325 (65 FE) MG tablet Take 325 mg by mouth daily with breakfast.    . fluticasone (FLONASE) 50 MCG/ACT nasal spray Place 2 sprays into both nostrils daily. 16 g 6  . levETIRAcetam (KEPPRA) 500 MG tablet Take 1 tablet (500 mg total) by mouth 2 (two) times daily. (Patient taking differently: Take 500 mg by mouth at bedtime. ) 60 tablet 2  . levothyroxine (SYNTHROID) 50 MCG tablet Take 1 tablet (50 mcg total) by mouth daily before breakfast.    . methylPREDNISolone (MEDROL) 4 MG TBPK tablet Take according to package insert (Patient not taking: Reported on 04/19/2019) 21 tablet 0  . Multiple Vitamin (MULTIVITAMIN WITH MINERALS) TABS tablet Take 1 tablet by mouth daily.    . ondansetron (ZOFRAN ODT) 4 MG disintegrating tablet Take 1 tablet (4 mg total) by mouth every 4 (four) hours as needed for nausea or vomiting.    Marland Kitchen oxyCODONE-acetaminophen (PERCOCET) 5-325 MG tablet Take 1 tablet by mouth every 4 (four) hours as needed for severe pain. (Patient not taking: Reported on 04/19/2019) 30 tablet 0  . OXYGEN Inhale 2 L into the lungs continuous.    . pantoprazole (PROTONIX) 40 MG tablet Take 1 tablet (40 mg total) by mouth daily before breakfast. 90 tablet 3  . predniSONE (DELTASONE) 20 MG tablet 3 tabs poqday 1-2, 2 tabs poqday 3-4, 1 tab poqday 5-6 12 tablet 0  . senna-docusate (SENOKOT-S) 8.6-50 MG tablet Take 1 tablet by mouth at bedtime as needed for mild constipation.    Marland Kitchen umeclidinium-vilanterol (ANORO ELLIPTA) 62.5-25 MCG/INH AEPB Inhale 1 puff into the lungs daily. 90 each 3  . [DISCONTINUED] VENTOLIN HFA 108 (90 Base) MCG/ACT inhaler INHALE 2 PUFFS EVERY 6 HOURS AS NEEDED. 18 g 2   No current facility-administered medications on file prior to visit.     Allergies:   Allergies  Allergen Reactions  . Ciprofloxacin Hcl Hives  . Sulfonamide Derivatives Hives and Rash  . Iohexol Other (See Comments)    UNSPECIFIED REACTION  Desc: Per [facility], pt is allergic to IV  contrast, no type of  reaction was available. Pt cannot remember but first reacted around 15 yrs ago from an IVP. Notes were date from 2009 at urology center., Onset Date: [id number]   . Prozac [Fluoxetine Hcl] Rash     Physical Exam  Limited exam due to visit type  Depression screen [facility] 2/9 04/27/2019  Decreased Interest 0  Down, Depressed, Hopeless 0  PHQ - 2 Score 0  Altered sleeping -  Tired, decreased energy -  Change in appetite -  Feeling bad or failure about yourself  -  Trouble concentrating -  Moving slowly or fidgety/restless -  Suicidal thoughts -  PHQ-9 Score -  Difficult doing work/chores -  Some recent data might be hidden    General: Pleasant elderly Caucasian female asking and answering questions appropriately throughout conversation  Neurologic Exam Mental Status:  Oriented to place and time. Recent and remote memory intact. Attention span, concentration and fund of knowledge appropriate.      Diagnostic Data (Labs, Imaging, Testing)  CT HEAD WO CONTRAST IMPRESSION: 1. Left parietal and occipital parenchymal hematoma measuring up to 3.7 cm. This is nonspecific. It is most likely related to hypertension or underlying vasculitis in a patient of this age. The differential diagnosis includes metastatic disease or other vascular malformation. No other lesions are evident. 2. Surrounding edema and mass effect is mostly local with minimal midline shift of 3-4 mm. 3. No other focal lesions or hemorrhage.  MR BRAIN W WO CONTRAST IMPRESSION: 1. Five intraparenchymal metastatic lesions, the largest of which is located in the posterior left parietal lobe where the associated hemorrhage makes accurate measurement difficult. 2. Unchanged size of hematoma  MR BRAIN WO CONTRAST MR MRA HEAD  MR MRA NECK IMPRESSION: MRI HEAD IMPRESSION:   1. Stable size of acute intraparenchymal hemorrhage centered at the left parietal convexity, estimated volume 25 cc.  Surrounding vasogenic edema with mild regional mass effect without midline shift. No identifiable etiology identified on this exam. 2. Otherwise normal brain MRI for age.   MRA HEAD IMPRESSION:   1. Technically limited exam due to moderate motion degradation. 2. Grossly negative intracranial MRA. No large vessel occlusion, hemodynamically significant stenosis, or other acute vascular process. No vascular abnormality seen underlying the left parietal hemorrhage.   ECHOCARDIOGRAM 03/07/2019 IMPRESSIONS  1. The left ventricle has normal systolic function with an ejection fraction of 60-65%. The cavity size was normal. There is mild concentric left ventricular hypertrophy. Left ventricular diastolic Doppler parameters are consistent with impaired  relaxation.  2. The right ventricle has normal systolic function. The cavity was normal. There is no increase in right ventricular wall thickness.  3. The mitral valve is grossly normal. There is moderate mitral annular calcification present.  4. The tricuspid valve is grossly normal.  5. The aortic valve is tricuspid.  6. The aortic root is normal in size and structure.  7. The interatrial septum was not well visualized.  .     ASSESSMENT: TOSHIBA HEIT is a 78 y.o. year old female with left parietal lobar hematoma on 03/03/2019 likely secondary to underlying metastatic hemorrhage with history of lung cancer and MRI confirming brain metastasis lesions after presenting with aphasia and right-sided weakness. Vascular risk factors include brain metastasis, HTN, HLD, prior tobacco use and prior right carotid aneurysm s/p resection 2014.  She underwent left stereotactic craniotomy for tumor resection on 03/18/2019 without complication.  Residual deficits of blurred vision but otherwise recovering well.    PLAN:  1. ICH:   Avoid  aspirin, aspirin-containing products or ibuprofen products.  No indication to initiate aspirin for secondary stroke  prevention as no prior history of stroke or cardiovascular disease.  She was discharged on statin therapy but is currently not taking as she did not have additional refills.  Maintain strict control of hypertension with blood pressure goal below 130/90, diabetes with hemoglobin A1c goal below 6.5% and cholesterol with LDL cholesterol (bad cholesterol) goal below 70 mg/dL.  I also advised the patient to eat a healthy diet with plenty of whole grains, cereals, fruits and vegetables, exercise regularly with at least 30 minutes of continuous activity daily and maintain ideal body weight. 2. HTN: Advised to continue current treatment regimen.    Advised to continue to monitor at home along with continued follow-up with PCP for management 3. HLD: Discussion regarding restarting statin and importance of statin therapy for secondary stroke prevention.  She prefers to follow-up with her PCP Dr. Tanya Nones for need of restarting. 4. Brain metastasis: Ongoing follow-up with neuro-oncology with repeat MRI around 07/2019 as reported by patient    Follow up in 4 months or call earlier if needed - she will call office back to schedule   Telephone visit total duration 18 minutes which was spent on counseling, explanation of diagnosis of ICH, reviewing risk factor management of HTN, HLD and brain mets, planning of further management along with potential future management, and answering all questions to patient satisfaction   George Hugh, AGNP-BC  [facility] [address] [place], [place] [address]  Phone [phone] Fax [phone] Note: This document was prepared with digital dictation and possible smart phrase technology. Any transcriptional errors that result from this process are unintentional.

## 2019-04-27 NOTE — Telephone Encounter (Signed)
Pt called and not able to connect to the VV apt and would like the provider to call her on the phone, pt said that she is very stressed and needs to speak to the provider

## 2019-04-28 NOTE — Progress Notes (Signed)
I agree with the above plan 

## 2019-04-29 ENCOUNTER — Encounter: Payer: Self-pay | Admitting: Family Medicine

## 2019-04-29 NOTE — Progress Notes (Signed)
  Radiation Oncology         [phone] ________________________________  Name: [name] MRN: [id number]  Date: 03/17/2019  DOB: May 16, 1941  End of Treatment Note  Diagnosis:   78 y.o.with newly diagnosed brain metastases secondary to NSCLC, adenocarcinoma.     Indication for treatment:  Palliative       Radiation treatment dates:   03/17/2019  Site/dose:    PTV1 Post Lt Parietal 80mm / 18Gy PTV2 Lt Cerebellar 75mm / 20Gy PTV3 Rt Temp 71mm / 20Gy PTV4 Post Lt Insular 34mm / 20 Gy PTV5 Lt Frontal Gyrus 70mm / 20Gy  Beams/energy:   Targets were treated using 6 Rapid Arc VMAT Beams to a prescription dose of 18-20 Gy.  ExacTrac registration was performed for each couch angle.  The 131.4% isodose line was prescribed.  6 MV X-rays were delivered in the flattening filter free beam mode.  Narrative: The patient tolerated radiation treatment relatively well without any acute side effects.  Plan: The patient has completed radiation treatment. The patient will return to radiation oncology clinic for routine followup in one month. I advised her to call or return sooner if she has any questions or concerns related to her recovery or treatment. ________________________________  Sheral Apley. Tammi Klippel, M.D.   This document serves as a record of services personally performed by Tyler Pita, MD. It was created on his behalf by Wilburn Mylar, a trained medical scribe. The creation of this record is based on the scribe's personal observations and the provider's statements to them. This document has been checked and approved by the attending provider.

## 2019-04-30 ENCOUNTER — Ambulatory Visit: Payer: PPO

## 2019-05-05 ENCOUNTER — Other Ambulatory Visit: Payer: Self-pay | Admitting: Radiation Therapy

## 2019-05-05 ENCOUNTER — Encounter: Payer: Self-pay | Admitting: Family Medicine

## 2019-05-05 DIAGNOSIS — R0902 Hypoxemia: Secondary | ICD-10-CM | POA: Diagnosis not present

## 2019-05-05 DIAGNOSIS — J9621 Acute and chronic respiratory failure with hypoxia: Secondary | ICD-10-CM | POA: Diagnosis not present

## 2019-05-05 DIAGNOSIS — C7949 Secondary malignant neoplasm of other parts of nervous system: Secondary | ICD-10-CM

## 2019-05-05 DIAGNOSIS — R062 Wheezing: Secondary | ICD-10-CM | POA: Diagnosis not present

## 2019-05-05 DIAGNOSIS — C3490 Malignant neoplasm of unspecified part of unspecified bronchus or lung: Secondary | ICD-10-CM | POA: Diagnosis not present

## 2019-05-05 DIAGNOSIS — C7931 Secondary malignant neoplasm of brain: Secondary | ICD-10-CM

## 2019-05-06 DIAGNOSIS — R1084 Generalized abdominal pain: Secondary | ICD-10-CM | POA: Diagnosis not present

## 2019-05-07 ENCOUNTER — Other Ambulatory Visit: Payer: Self-pay | Admitting: Family Medicine

## 2019-05-07 ENCOUNTER — Telehealth: Payer: Self-pay | Admitting: Family Medicine

## 2019-05-07 DIAGNOSIS — R1313 Dysphagia, pharyngeal phase: Secondary | ICD-10-CM | POA: Diagnosis not present

## 2019-05-07 MED ORDER — AMOXICILLIN-POT CLAVULANATE 875-125 MG PO TABS: 1.0000 | ORAL_TABLET | Freq: Two times a day (BID) | ORAL | 0 refills | Status: DC

## 2019-05-07 NOTE — Telephone Encounter (Signed)
I will send in augmentin

## 2019-05-07 NOTE — Telephone Encounter (Signed)
Pt called and stated that she is finished with antibx and she is still having congestion with dark yellow sputum and is wondering if she needs additional days on antibx???   If we send in medication please put on rx to deliver.

## 2019-05-08 NOTE — Telephone Encounter (Signed)
Pt aware.

## 2019-05-14 ENCOUNTER — Encounter: Payer: Self-pay | Admitting: Family Medicine

## 2019-05-14 ENCOUNTER — Other Ambulatory Visit: Payer: Self-pay | Admitting: Family Medicine

## 2019-05-14 MED ORDER — ALBUTEROL SULFATE HFA 108 (90 BASE) MCG/ACT IN AERS: 2.0000 | INHALATION_SPRAY | Freq: Four times a day (QID) | RESPIRATORY_TRACT | 2 refills | Status: DC | PRN

## 2019-05-25 ENCOUNTER — Encounter: Payer: Self-pay | Admitting: Family Medicine

## 2019-05-25 ENCOUNTER — Emergency Department (HOSPITAL_COMMUNITY): Payer: PPO

## 2019-05-25 ENCOUNTER — Inpatient Hospital Stay (HOSPITAL_COMMUNITY)
Admission: EM | Admit: 2019-05-25 | Discharge: 2019-05-28 | DRG: 177 | Disposition: A | Discharge status: Home Health | Payer: PPO | Attending: Family Medicine | Admitting: Family Medicine

## 2019-05-25 ENCOUNTER — Telehealth: Payer: Self-pay | Admitting: *Deleted

## 2019-05-25 ENCOUNTER — Other Ambulatory Visit: Payer: Self-pay

## 2019-05-25 ENCOUNTER — Ambulatory Visit (INDEPENDENT_AMBULATORY_CARE_PROVIDER_SITE_OTHER): Payer: PPO | Admitting: Family Medicine

## 2019-05-25 ENCOUNTER — Encounter (HOSPITAL_COMMUNITY): Payer: Self-pay

## 2019-05-25 VITALS — BP 108/60 | HR 110 | Temp 98.6°F | Resp 20 | Ht 66.0 in

## 2019-05-25 DIAGNOSIS — Z8711 Personal history of peptic ulcer disease: Secondary | ICD-10-CM | POA: Diagnosis not present

## 2019-05-25 DIAGNOSIS — Z9842 Cataract extraction status, left eye: Secondary | ICD-10-CM

## 2019-05-25 DIAGNOSIS — R0902 Hypoxemia: Secondary | ICD-10-CM | POA: Diagnosis not present

## 2019-05-25 DIAGNOSIS — R0609 Other forms of dyspnea: Secondary | ICD-10-CM | POA: Diagnosis present

## 2019-05-25 DIAGNOSIS — Z85841 Personal history of malignant neoplasm of brain: Secondary | ICD-10-CM

## 2019-05-25 DIAGNOSIS — J189 Pneumonia, unspecified organism: Secondary | ICD-10-CM | POA: Diagnosis not present

## 2019-05-25 DIAGNOSIS — Z20828 Contact with and (suspected) exposure to other viral communicable diseases: Secondary | ICD-10-CM | POA: Diagnosis present

## 2019-05-25 DIAGNOSIS — Z961 Presence of intraocular lens: Secondary | ICD-10-CM | POA: Diagnosis not present

## 2019-05-25 DIAGNOSIS — J9621 Acute and chronic respiratory failure with hypoxia: Secondary | ICD-10-CM | POA: Diagnosis present

## 2019-05-25 DIAGNOSIS — Z8249 Family history of ischemic heart disease and other diseases of the circulatory system: Secondary | ICD-10-CM | POA: Diagnosis not present

## 2019-05-25 DIAGNOSIS — R0600 Dyspnea, unspecified: Secondary | ICD-10-CM | POA: Diagnosis present

## 2019-05-25 DIAGNOSIS — R079 Chest pain, unspecified: Secondary | ICD-10-CM | POA: Diagnosis not present

## 2019-05-25 DIAGNOSIS — Z85118 Personal history of other malignant neoplasm of bronchus and lung: Secondary | ICD-10-CM

## 2019-05-25 DIAGNOSIS — Z79891 Long term (current) use of opiate analgesic: Secondary | ICD-10-CM | POA: Diagnosis not present

## 2019-05-25 DIAGNOSIS — I69391 Dysphagia following cerebral infarction: Secondary | ICD-10-CM | POA: Diagnosis not present

## 2019-05-25 DIAGNOSIS — J690 Pneumonitis due to inhalation of food and vomit: Principal | ICD-10-CM | POA: Diagnosis present

## 2019-05-25 DIAGNOSIS — J9601 Acute respiratory failure with hypoxia: Secondary | ICD-10-CM | POA: Diagnosis not present

## 2019-05-25 DIAGNOSIS — M40209 Unspecified kyphosis, site unspecified: Secondary | ICD-10-CM | POA: Diagnosis present

## 2019-05-25 DIAGNOSIS — R0602 Shortness of breath: Secondary | ICD-10-CM | POA: Diagnosis not present

## 2019-05-25 DIAGNOSIS — E039 Hypothyroidism, unspecified: Secondary | ICD-10-CM | POA: Diagnosis not present

## 2019-05-25 DIAGNOSIS — J449 Chronic obstructive pulmonary disease, unspecified: Secondary | ICD-10-CM | POA: Diagnosis present

## 2019-05-25 DIAGNOSIS — C7931 Secondary malignant neoplasm of brain: Secondary | ICD-10-CM | POA: Diagnosis present

## 2019-05-25 DIAGNOSIS — R1310 Dysphagia, unspecified: Secondary | ICD-10-CM | POA: Diagnosis not present

## 2019-05-25 DIAGNOSIS — Z90710 Acquired absence of both cervix and uterus: Secondary | ICD-10-CM

## 2019-05-25 DIAGNOSIS — J439 Emphysema, unspecified: Secondary | ICD-10-CM | POA: Diagnosis present

## 2019-05-25 DIAGNOSIS — Z79890 Hormone replacement therapy (postmenopausal): Secondary | ICD-10-CM

## 2019-05-25 DIAGNOSIS — C3490 Malignant neoplasm of unspecified part of unspecified bronchus or lung: Secondary | ICD-10-CM | POA: Diagnosis present

## 2019-05-25 DIAGNOSIS — E860 Dehydration: Secondary | ICD-10-CM | POA: Diagnosis not present

## 2019-05-25 DIAGNOSIS — Z800 Family history of malignant neoplasm of digestive organs: Secondary | ICD-10-CM

## 2019-05-25 DIAGNOSIS — R1013 Epigastric pain: Secondary | ICD-10-CM | POA: Diagnosis present

## 2019-05-25 DIAGNOSIS — Z9049 Acquired absence of other specified parts of digestive tract: Secondary | ICD-10-CM

## 2019-05-25 DIAGNOSIS — Z87891 Personal history of nicotine dependence: Secondary | ICD-10-CM | POA: Diagnosis not present

## 2019-05-25 DIAGNOSIS — Z9981 Dependence on supplemental oxygen: Secondary | ICD-10-CM

## 2019-05-25 DIAGNOSIS — Z87442 Personal history of urinary calculi: Secondary | ICD-10-CM

## 2019-05-25 DIAGNOSIS — K5900 Constipation, unspecified: Secondary | ICD-10-CM | POA: Diagnosis not present

## 2019-05-25 DIAGNOSIS — R1313 Dysphagia, pharyngeal phase: Secondary | ICD-10-CM | POA: Diagnosis not present

## 2019-05-25 DIAGNOSIS — J181 Lobar pneumonia, unspecified organism: Secondary | ICD-10-CM

## 2019-05-25 DIAGNOSIS — Z79899 Other long term (current) drug therapy: Secondary | ICD-10-CM

## 2019-05-25 DIAGNOSIS — R918 Other nonspecific abnormal finding of lung field: Secondary | ICD-10-CM | POA: Diagnosis not present

## 2019-05-25 DIAGNOSIS — M8000XA Age-related osteoporosis with current pathological fracture, unspecified site, initial encounter for fracture: Secondary | ICD-10-CM | POA: Diagnosis present

## 2019-05-25 LAB — COMPREHENSIVE METABOLIC PANEL
ALT: 13 U/L (ref 0–44)
AST: 16 U/L (ref 15–41)
Albumin: 4.2 g/dL (ref 3.5–5.0)
Alkaline Phosphatase: 108 U/L (ref 38–126)
Anion gap: 13 (ref 5–15)
BUN: 16 mg/dL (ref 8–23)
CO2: 27 mmol/L (ref 22–32)
Calcium: 9.6 mg/dL (ref 8.9–10.3)
Chloride: 94 mmol/L — ABNORMAL LOW (ref 98–111)
Creatinine, Ser: 0.44 mg/dL (ref 0.44–1.00)
GFR calc Af Amer: >60 mL/min (ref >60)
GFR calc non Af Amer: >60 mL/min (ref >60)
Glucose, Bld: 71 mg/dL (ref 70–99)
Potassium: 3.9 mmol/L (ref 3.5–5.1)
Sodium: 134 mmol/L — ABNORMAL LOW (ref 135–145)
Total Bilirubin: 0.8 mg/dL (ref 0.3–1.2)
Total Protein: 7.2 g/dL (ref 6.5–8.1)

## 2019-05-25 LAB — CBC WITH DIFFERENTIAL/PLATELET
Abs Immature Granulocytes: 0.02 10*3/uL (ref 0.00–0.07)
Basophils Absolute: 0 10*3/uL (ref 0.0–0.1)
Basophils Relative: 1 %
Eosinophils Absolute: 0.1 10*3/uL (ref 0.0–0.5)
Eosinophils Relative: 2 %
HCT: 40.1 % (ref 36.0–46.0)
Hemoglobin: 12.7 g/dL (ref 12.0–15.0)
Immature Granulocytes: 0 %
Lymphocytes Relative: 11 %
Lymphs Abs: 0.7 10*3/uL (ref 0.7–4.0)
MCH: 28.7 pg (ref 26.0–34.0)
MCHC: 31.7 g/dL (ref 30.0–36.0)
MCV: 90.7 fL (ref 80.0–100.0)
Monocytes Absolute: 0.4 10*3/uL (ref 0.1–1.0)
Monocytes Relative: 7 %
Neutro Abs: 4.9 10*3/uL (ref 1.7–7.7)
Neutrophils Relative %: 79 %
Platelets: 380 10*3/uL (ref 150–400)
RBC: 4.42 MIL/uL (ref 3.87–5.11)
RDW: 12.6 % (ref 11.5–15.5)
WBC: 6.1 10*3/uL (ref 4.0–10.5)
nRBC: 0 % (ref 0.0–0.2)

## 2019-05-25 LAB — PROCALCITONIN: Procalcitonin: <0.1 ng/mL

## 2019-05-25 LAB — TROPONIN I (HIGH SENSITIVITY)
Troponin I (High Sensitivity): 5 ng/L (ref <18)
Troponin I (High Sensitivity): 6 ng/L (ref <18)

## 2019-05-25 LAB — D-DIMER, QUANTITATIVE: D-Dimer, Quant: 1.9 ug/mL-FEU — ABNORMAL HIGH (ref 0.00–0.50)

## 2019-05-25 LAB — LACTIC ACID, PLASMA: Lactic Acid, Venous: 0.8 mmol/L (ref 0.5–1.9)

## 2019-05-25 LAB — SARS CORONAVIRUS 2 BY RT PCR (HOSPITAL ORDER, PERFORMED IN ~~LOC~~ HOSPITAL LAB): SARS Coronavirus 2: NEGATIVE

## 2019-05-25 LAB — BRAIN NATRIURETIC PEPTIDE: B Natriuretic Peptide: 46 pg/mL (ref 0.0–100.0)

## 2019-05-25 MED ORDER — CITALOPRAM HYDROBROMIDE 20 MG PO TABS
20.0000 mg | ORAL_TABLET | Freq: Every day | ORAL | Status: DC
  Administered 2019-05-25 – 2019-05-27 (×3): 20 mg via ORAL
  Filled 2019-05-25 (×3): qty 1

## 2019-05-25 MED ORDER — SODIUM CHLORIDE 0.9 % IV SOLN
1.0000 g | Freq: Once | INTRAVENOUS | Status: AC
  Administered 2019-05-25: 1 g via INTRAVENOUS
  Filled 2019-05-25: qty 10

## 2019-05-25 MED ORDER — SENNOSIDES-DOCUSATE SODIUM 8.6-50 MG PO TABS
1.0000 | ORAL_TABLET | Freq: Every evening | ORAL | Status: DC | PRN
  Filled 2019-05-25: qty 1

## 2019-05-25 MED ORDER — DOXYCYCLINE HYCLATE 100 MG PO TABS
100.0000 mg | ORAL_TABLET | Freq: Once | ORAL | Status: AC
  Administered 2019-05-25: 100 mg via ORAL
  Filled 2019-05-25: qty 1

## 2019-05-25 MED ORDER — IPRATROPIUM-ALBUTEROL 0.5-2.5 (3) MG/3ML IN SOLN
3.0000 mL | Freq: Four times a day (QID) | RESPIRATORY_TRACT | Status: DC
  Administered 2019-05-25 – 2019-05-26 (×4): 3 mL via RESPIRATORY_TRACT
  Filled 2019-05-25 (×4): qty 3

## 2019-05-25 MED ORDER — FLUTICASONE PROPIONATE 50 MCG/ACT NA SUSP
2.0000 | Freq: Every day | NASAL | Status: DC
  Administered 2019-05-26 – 2019-05-28 (×3): 2 via NASAL
  Filled 2019-05-25: qty 16

## 2019-05-25 MED ORDER — SODIUM CHLORIDE 0.9% FLUSH
3.0000 mL | Freq: Two times a day (BID) | INTRAVENOUS | Status: DC
  Administered 2019-05-25 – 2019-05-28 (×6): 3 mL via INTRAVENOUS

## 2019-05-25 MED ORDER — ALBUTEROL (5 MG/ML) CONTINUOUS INHALATION SOLN: 10.0000 mg/h | INHALATION_SOLUTION | RESPIRATORY_TRACT | Status: AC

## 2019-05-25 MED ORDER — ENOXAPARIN SODIUM 40 MG/0.4ML ~~LOC~~ SOLN
40.0000 mg | SUBCUTANEOUS | Status: DC
  Administered 2019-05-25 – 2019-05-27 (×3): 40 mg via SUBCUTANEOUS
  Filled 2019-05-25 (×3): qty 0.4

## 2019-05-25 MED ORDER — ADULT MULTIVITAMIN W/MINERALS CH
1.0000 | ORAL_TABLET | Freq: Every day | ORAL | Status: DC
  Administered 2019-05-26 – 2019-05-28 (×3): 1 via ORAL
  Filled 2019-05-25 (×3): qty 1

## 2019-05-25 MED ORDER — FERROUS SULFATE 325 (65 FE) MG PO TABS
325.0000 mg | ORAL_TABLET | Freq: Every day | ORAL | Status: DC
  Administered 2019-05-26 – 2019-05-28 (×3): 325 mg via ORAL
  Filled 2019-05-25 (×3): qty 1

## 2019-05-25 MED ORDER — SODIUM CHLORIDE 0.9% FLUSH: 3.0000 mL | INTRAVENOUS | Status: DC | PRN

## 2019-05-25 MED ORDER — SODIUM CHLORIDE 0.9 % IV SOLN
500.0000 mg | INTRAVENOUS | Status: DC
  Administered 2019-05-25: 500 mg via INTRAVENOUS
  Filled 2019-05-25: qty 500

## 2019-05-25 MED ORDER — UMECLIDINIUM-VILANTEROL 62.5-25 MCG/INH IN AEPB
1.0000 | INHALATION_SPRAY | Freq: Every day | RESPIRATORY_TRACT | Status: DC
  Administered 2019-05-27 – 2019-05-28 (×2): 1 via RESPIRATORY_TRACT
  Filled 2019-05-25: qty 14

## 2019-05-25 MED ORDER — ALPRAZOLAM 0.25 MG PO TABS
0.2500 mg | ORAL_TABLET | Freq: Two times a day (BID) | ORAL | Status: DC | PRN
  Administered 2019-05-25 – 2019-05-28 (×5): 0.25 mg via ORAL
  Filled 2019-05-25 (×5): qty 1

## 2019-05-25 MED ORDER — SODIUM CHLORIDE 0.9 % IV SOLN: 250.0000 mL | INTRAVENOUS | Status: DC | PRN

## 2019-05-25 MED ORDER — PANTOPRAZOLE SODIUM 40 MG PO TBEC
40.0000 mg | DELAYED_RELEASE_TABLET | Freq: Every day | ORAL | Status: DC
  Administered 2019-05-26 – 2019-05-27 (×2): 40 mg via ORAL
  Filled 2019-05-25 (×2): qty 1

## 2019-05-25 MED ORDER — METHYLPREDNISOLONE SODIUM SUCC 40 MG IJ SOLR
40.0000 mg | Freq: Two times a day (BID) | INTRAMUSCULAR | Status: DC
  Administered 2019-05-25 – 2019-05-27 (×4): 40 mg via INTRAVENOUS
  Filled 2019-05-25 (×5): qty 1

## 2019-05-25 MED ORDER — ACETAMINOPHEN 325 MG PO TABS
650.0000 mg | ORAL_TABLET | Freq: Four times a day (QID) | ORAL | Status: DC | PRN
  Administered 2019-05-25 – 2019-05-28 (×5): 650 mg via ORAL

## 2019-05-25 MED ORDER — LEVETIRACETAM 500 MG PO TABS
500.0000 mg | ORAL_TABLET | Freq: Every day | ORAL | Status: DC
  Filled 2019-05-25 (×2): qty 1

## 2019-05-25 MED ORDER — ACETAMINOPHEN 325 MG PO TABS
650.0000 mg | ORAL_TABLET | Freq: Four times a day (QID) | ORAL | Status: DC | PRN
  Administered 2019-05-27: 650 mg via ORAL
  Filled 2019-05-25 (×6): qty 2

## 2019-05-25 MED ORDER — ONDANSETRON HCL 4 MG/2ML IJ SOLN
4.0000 mg | Freq: Four times a day (QID) | INTRAMUSCULAR | Status: DC | PRN
  Administered 2019-05-25 – 2019-05-27 (×3): 4 mg via INTRAVENOUS
  Filled 2019-05-25 (×4): qty 2

## 2019-05-25 MED ORDER — ACETAMINOPHEN 650 MG RE SUPP
650.0000 mg | Freq: Four times a day (QID) | RECTAL | Status: DC | PRN
  Administered 2019-05-26: 650 mg via RECTAL
  Filled 2019-05-25 (×2): qty 1

## 2019-05-25 MED ORDER — LEVOTHYROXINE SODIUM 50 MCG PO TABS
50.0000 ug | ORAL_TABLET | Freq: Every day | ORAL | Status: DC
  Administered 2019-05-26 – 2019-05-28 (×3): 50 ug via ORAL
  Filled 2019-05-25 (×3): qty 1

## 2019-05-25 NOTE — Telephone Encounter (Signed)
Received call from patient.   Reports that she has been experiencing weakness since being treated for PNA. Reports that she has been checking her SpO2/HR and noted that it drops when she is moving around.   Denies cough, fever, SOB.   Appointment scheduled.

## 2019-05-25 NOTE — ED Notes (Addendum)
Ambulated around nurse station w/ walker on 2L o2.  sats dropped to 95% while ambulating. Pt c/o some chest tightness and sob.   Pt sat dropped to 88% while leaving and climbing into bed.

## 2019-05-25 NOTE — H&P (Signed)
History and Physical    BROOKLINN LONGBOTTOM FUX:[id number] DOB: 12-04-1940 DOA: 05/25/2019  PCP: [name], MD   Patient coming from: Home  Chief Complaint: Dyspnea/hypoxemia with exertion; inspirational CP  HPI: [name] is a 78 y.o. 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  She was also trialed on a prednisone taper in the outpatient setting with no relief of her symptoms.  She denies any fevers or chills at home and does not have a significant cough or sputum production.  It appears that she was recently seen by an ENT physician Dr. Redmond Baseman at [facility] for pharyngeal dysphagia and is noted to have recurrent swallowing issues from her recent stroke and brain surgery.  She is recommended to have a modified barium swallow for further evaluation.  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   ED Course: Vital signs are stable and patient is afebrile.  Laboratory data is otherwise unremarkable with no leukocytosis and lactic acid level of 0.8 I have ordered a d-dimer as well as a procalcitonin level, which are currently pending.  Patient was started empirically on Rocephin IV as well as oral doxycycline.  Two-view chest  x-ray with some right sided infiltrate noted that may reflect pneumonia versus atelectasis.  She is in no respiratory distress and is currently on 3 L nasal cannula.  She appeared to maintain her oxygen saturation with ambulation, but complained of chest tightness and shortness of breath and towards the end of her walk desaturated to approximately 88%.  Review of Systems: All others reviewed and otherwise negative.  Past Medical History:  Diagnosis Date  . Adenocarcinoma of lung ([place]) 12/2010   left lung/surg only  . Allergic rhinitis   . 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 Anxiety disorder   . Aortic aneurysm ([place])   . Brain metastasis ([place])    lung cancer s/p resection 7/20  . Complication of anesthesia 2011   bloodpressure dropped during colonoscopy, not problems since  . Compression fracture   . COPD (chronic obstructive pulmonary disease) ([place])   . Depression   . Diastolic dysfunction   . Diverticulosis   . Dysphagia   . Emphysema lung ([place]) 11/08/2014  . Headache   . Hiatal hernia   . History of kidney stones   . Hypothyroidism   . IBS (irritable bowel syndrome)   . Iron deficiency anemia due to chronic blood loss 12/05/2016  . On home O2 12/15/2013   chronic hypoxia  . Pneumonia   . PUD (peptic ulcer disease)    32yrs  . Shortness of breath    with exertion  . SIADH (syndrome of inappropriate ADH production) ([place])   . Trigeminal neuralgia     Past Surgical History:  Procedure Laterality Date  . ABDOMINAL HYSTERECTOMY    .  APPLICATION OF CRANIAL NAVIGATION N/A 03/18/2019   Procedure: APPLICATION OF CRANIAL NAVIGATION;  Surgeon: Consuella Lose, MD;  Location: [place];  Service: Neurosurgery;  Laterality: N/A;  . BACK SURGERY  January 13, 2014  . CATARACT EXTRACTION    . CATARACT EXTRACTION W/PHACO  09/02/2012   Procedure: CATARACT EXTRACTION PHACO AND INTRAOCULAR LENS PLACEMENT (IOC);  Surgeon: Elta Guadeloupe T. Gershon Crane, MD;  [facility]: [facility];   Service: Ophthalmology;  Laterality: Left;  CDE:10.35  . CHOLECYSTECTOMY    . 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 COLONOSCOPY N/A 11/23/2014   Dr. Oneida Alar: moderate diverticulosis, hemorrhoids, redundant colon. next TCS in 10-15 years.   . CRANIOTOMY Left 03/18/2019   Procedure: Left stereotactic craniotomy for tumor resection;  Surgeon: Consuella Lose, MD;  Location: [place];  Service: Neurosurgery;  Laterality: Left;  Left stereotactic craniotomy for tumor resection  . ENDARTERECTOMY Right 08/21/2013   Procedure: RIGHT CAROTID ANEURYSM RESECTION;  Surgeon: Rosetta Posner, MD;  Location: [place];  Service: Vascular;  Laterality: Right;  . ESOPHAGEAL DILATION  02/24/2018   Procedure: ESOPHAGEAL DILATION;  Surgeon: Danie Binder, MD;  [facility];  Service: Endoscopy;;  . ESOPHAGOGASTRODUODENOSCOPY  11/13/2009   w/dilation to 79mm, gastric ulceration (H.Pylori) s/p treatment  . ESOPHAGOGASTRODUODENOSCOPY  11/21/2009   distal esophageal web, gastritis  . ESOPHAGOGASTRODUODENOSCOPY  03/14/12   YSA:[id number] in the distal esophagus/Mild gastritis/small HH. + H.pylori, prescribed Pylera. Finished treatment.   . ESOPHAGOGASTRODUODENOSCOPY N/A 12/13/2017   Procedure: ESOPHAGOGASTRODUODENOSCOPY (EGD);  Surgeon: Danie Binder, MD;  [facility];  Service: Endoscopy;  Laterality: N/A;  8:30am  . ESOPHAGOGASTRODUODENOSCOPY N/A 02/24/2018   Procedure: ESOPHAGOGASTRODUODENOSCOPY (EGD);  Surgeon: Danie Binder, MD;  [facility];  Service: Endoscopy;  Laterality: N/A;  11:00am  . fatty tumor removal from lt groin    . FRACTURE SURGERY Left    hand and left ring finger  . GIVENS CAPSULE STUDY N/A 12/26/2017   Procedure: GIVENS CAPSULE STUDY;  Surgeon: Danie Binder, MD;  [facility];  Service: Endoscopy;  Laterality: N/A;  7:30am  . GIVENS CAPSULE STUDY N/A 02/24/2018   Procedure: GIVENS CAPSULE STUDY;  Surgeon: Danie Binder, MD;  [facility];  Service: Endoscopy;  Laterality: N/A;  . KNEE SURGERY     patella tendon repair june 2019 [place]  . LUNG CANCER SURGERY  12/2010   Left VATS, minithoracotomy, LLL superior segmentectomy  . ORIF PATELLA Right 03/18/2018   Procedure: OPEN REDUCTION INTERNAL (ORIF) FIXATION RIGHT PATELLA;  Surgeon: Carole Civil, MD;  Location: [facility];  Service: Orthopedics;  Laterality: Right;  . ORIF WRIST FRACTURE Left 04/09/2014   Procedure: OPEN REDUCTION INTERNAL FIXATION (ORIF) WRIST FRACTURE;  Surgeon: Renette Butters, MD;  Location: [place];  Service: Orthopedics;  Laterality: Left;  . PERCUTANEOUS PINNING Left 04/09/2014   Procedure: PERCUTANEOUS PINNING EXTREMITY;  Surgeon: Renette Butters, MD;  Location: [place];  Service: Orthopedics;  Laterality: Left;  . SAVORY DILATION N/A 12/13/2017   Procedure: SAVORY DILATION;  Surgeon: Danie Binder, MD;  [facility];  Service: Endoscopy;  Laterality: N/A;  . TUBAL LIGATION    . vocal cord surgery  02/06/2011   laryngoscopy with bilateral vocal cord Radiesse injection for vocal cord paralysis  . YAG LASER APPLICATION Left 32/35/5732   Procedure: YAG LASER APPLICATION;  Surgeon: Rutherford Guys, MD;  [facility];  Service: Ophthalmology;  Laterality: Left;     reports that she quit smoking about 8 years ago. Her smoking use included cigarettes. She has a 10.00 pack-year smoking history. She has never used smokeless tobacco. She reports current alcohol use. She reports that she does not use drugs.  Allergies  Allergen Reactions  . Ciprofloxacin Hcl Hives  . Sulfonamide Derivatives Hives and Rash  . Iohexol Other (See Comments)    UNSPECIFIED REACTION  Desc: Per [facility], pt is allergic to IV contrast, no type of reaction was available. Pt cannot remember but first reacted around 15 yrs ago from an IVP. Notes were date from 2009 at urology center., Onset Date: [id number]   . Prozac [Fluoxetine Hcl]  Rash    Family History  Problem Relation Age of Onset  . Pulmonary embolism Mother   . Heart attack Father   . Hypertension Father   . Liver cancer Sister   . Cancer Sister   . Cancer Brother   . Cancer Daughter   . Colon cancer Neg Hx     Prior to Admission medications   Medication Sig Start Date End Date Taking? Authorizing Provider  acetaminophen (TYLENOL) 325 MG tablet Take 650 mg by mouth every 6 (six) hours as needed for moderate pain or headache.     [provider]  albuterol (VENTOLIN HFA) 108 (90 Base) MCG/ACT inhaler Inhale 2 puffs into the lungs every 6 (six) hours as needed for wheezing or shortness of breath. 05/14/19   [name], MD  ALPRAZolam Duanne Moron) 0.25 MG tablet Take 1 tablet (0.25 mg total) by mouth 2 (two) times daily as needed for anxiety. 03/12/19   Donzetta Starch, NP  ALPRAZolam Duanne Moron) 0.5 MG tablet Take 1 tablet by mouth 2 (two) times daily as needed. 05/20/19   [provider]  amoxicillin-clavulanate (AUGMENTIN) 875-125 MG tablet Take 1 tablet by mouth 2 (two) times daily. Please deliver 05/07/19   [name], MD  citalopram (CELEXA) 40 MG tablet Take 0.5 tablets (20 mg total) by mouth at bedtime. 03/12/19   Donzetta Starch, NP  denosumab (PROLIA) 60 MG/ML SOSY injection Inject 60 mg into the skin every 6 (six) months. 04/21/19   [name], MD  ferrous sulfate 325 (65 FE) MG tablet Take 325 mg by mouth daily with breakfast.    [provider]  fluticasone (FLONASE) 50 MCG/ACT nasal spray Place 2 sprays into both nostrils daily. 12/30/18   [name], MD  levETIRAcetam (KEPPRA) 500 MG tablet Take 1 tablet (500 mg total) by mouth 2 (two) times daily. Patient taking differently: Take 500 mg by mouth at bedtime.  03/21/19   Costella, Vista Mink, PA-C  levothyroxine (SYNTHROID) 50 MCG tablet Take 1 tablet (50 mcg total) by mouth daily before breakfast. 03/12/19   Donzetta Starch, NP  Multiple Vitamin (MULTIVITAMIN WITH  MINERALS) TABS tablet Take 1 tablet by mouth daily.    [provider]  ondansetron (ZOFRAN ODT) 4 MG disintegrating tablet Take 1 tablet (4 mg total) by mouth every 4 (four) hours as needed for nausea or vomiting. 03/12/19   Donzetta Starch, NP  OXYGEN Inhale 2 L into the lungs continuous.    [provider]  pantoprazole (PROTONIX) 40 MG tablet Take 1 tablet (40 mg total) by mouth daily before breakfast. 02/05/19   Annitta Needs, NP  senna-docusate (SENOKOT-S) 8.6-50 MG tablet Take 1 tablet by mouth at bedtime as needed for mild constipation. 03/12/19  Donzetta Starch, NP  umeclidinium-vilanterol (ANORO ELLIPTA) 62.5-25 MCG/INH AEPB Inhale 1 puff into the lungs daily. 06/20/18   [name], MD    Physical Exam: Vitals:   05/25/19 1400 05/25/19 1430 05/25/19 1445 05/25/19 1500  BP: (!) 110/96 125/87  133/79  Pulse: (!) 102  98   Resp: 16 18 (!) 24 16  Temp:      SpO2: 92%  (!) 82%   Weight:      Height:        Constitutional: NAD, calm, comfortable Vitals:   05/25/19 1400 05/25/19 1430 05/25/19 1445 05/25/19 1500  BP: (!) 110/96 125/87  133/79  Pulse: (!) 102  98   Resp: 16 18 (!) 24 16  Temp:      SpO2: 92%  (!) 82%   Weight:      Height:       Eyes: lids and conjunctivae normal ENMT: Mucous membranes are moist.  Neck: normal, supple Respiratory: clear to auscultation bilaterally. Normal respiratory effort. No accessory muscle use.  Currently on 3 L nasal cannula. Cardiovascular: Regular rate and rhythm, no murmurs. No extremity edema. Abdomen: no tenderness, no distention. Bowel sounds positive.  Musculoskeletal:  No joint deformity upper and lower extremities.   Skin: no rashes, lesions, ulcers.  Psychiatric: Normal judgment and insight. Alert and oriented x 3. Normal mood.   Labs on Admission: I have personally reviewed following labs and imaging studies  CBC: Recent Labs  Lab 05/25/19 1135  WBC 6.1  NEUTROABS 4.9  HGB 12.7  HCT 40.1  MCV 90.7   PLT 790   Basic Metabolic Panel: Recent Labs  Lab 05/25/19 1135  NA 134*  K 3.9  CL 94*  CO2 27  GLUCOSE 71  BUN 16  CREATININE 0.44  CALCIUM 9.6   GFR: Estimated Creatinine Clearance: 49.4 mL/min (by C-G formula based on SCr of 0.44 mg/dL). Liver Function Tests: Recent Labs  Lab 05/25/19 1135  AST 16  ALT 13  ALKPHOS 108  BILITOT 0.8  PROT 7.2  ALBUMIN 4.2   No results for input(s): LIPASE, AMYLASE in the last 168 hours. No results for input(s): AMMONIA in the last 168 hours. Coagulation Profile: No results for input(s): INR, PROTIME in the last 168 hours. Cardiac Enzymes: No results for input(s): CKTOTAL, CKMB, CKMBINDEX, TROPONINI in the last 168 hours. BNP (last 3 results) No results for input(s): PROBNP in the last 8760 hours. HbA1C: No results for input(s): HGBA1C in the last 72 hours. CBG: No results for input(s): GLUCAP in the last 168 hours. Lipid Profile: No results for input(s): CHOL, HDL, LDLCALC, TRIG, CHOLHDL, LDLDIRECT in the last 72 hours. Thyroid Function Tests: No results for input(s): TSH, T4TOTAL, FREET4, T3FREE, THYROIDAB in the last 72 hours. Anemia Panel: No results for input(s): VITAMINB12, FOLATE, FERRITIN, TIBC, IRON, RETICCTPCT in the last 72 hours. Urine analysis:    Component Value Date/Time   COLORURINE YELLOW 03/06/2019 0332   APPEARANCEUR CLEAR 03/06/2019 0332   LABSPEC 1.012 03/06/2019 0332   PHURINE 6.0 03/06/2019 0332   GLUCOSEU NEGATIVE 03/06/2019 0332   HGBUR NEGATIVE 03/06/2019 0332   BILIRUBINUR NEGATIVE 03/06/2019 0332   KETONESUR NEGATIVE 03/06/2019 0332   PROTEINUR NEGATIVE 03/06/2019 0332   UROBILINOGEN 0.2 06/01/2015 1145   NITRITE NEGATIVE 03/06/2019 0332   LEUKOCYTESUR NEGATIVE 03/06/2019 0332    Radiological Exams on Admission: Dg Chest 2 View  Result Date: 05/25/2019 CLINICAL DATA:  Right chest pain with inhalation. The patient has been treated  for pneumonia over the past month. EXAM: CHEST - 2 VIEW  COMPARISON:  Single-view of the chest 04/19/2019. PA and lateral chest 04/18/2017. CT chest 04/19/2019. FINDINGS: The lungs are emphysematous. Right basilar airspace opacity is identified and slightly increased compared to the most recent plain films. Left lung is clear. Heart size is upper normal. Aortic atherosclerosis is noted. No pneumothorax or pleural fluid. No bony abnormality. IMPRESSION: Hazy right basilar airspace opacity appears slightly worse on the prior examination and could be due to atelectasis or pneumonia. Emphysema. Electronically Signed   By: Inge Rise M.D.   On: 05/25/2019 11:29    Assessment/Plan Active Problems:   Acute hypoxemic respiratory failure (HCC)    Mild acute on chronic hypoxemic respiratory failure in the setting of emphysema -I suspect that this may be related to some ongoing interstitial pneumonitis that is superimposed on emphysema possibly related to her ongoing pharyngeal dysphagia -We will place on dysphagia 3 diet for now and have SLP evaluation with potential modified barium swallow evaluation -Continue on antibiotics to include Rocephin and azithromycin for now -Check urine Legionella and strep pneumonia -Check procalcitonin -Check d-dimer and consider CT angiogram if elevated -Consulted pulmonology appreciated for further evaluation -Start IV methylprednisolone 40 mg twice daily -Place on breathing treatments every 6 hours  History of lung adenocarcinoma with metastasis to the brain status post recent resection -Not currently on any further treatments -Follow-up with neurosurgery as recommended -Continue on Keppra  Peptic ulcer disease -Continue on PPI  Hypothyroidism -Continue Synthroid  Osteoporosis with compression fractures -Continue Prolia outpatient  DVT prophylaxis: Lovenox Code Status: DNI Family Communication: Ex-husband at bedside Disposition Plan:Admit for further evaluation per pulmonology Consults called:Pulmonology  Admission status: Obs, Tele   Dom Haverland Darleen Crocker DO [facility] Pager [phone]  If 7PM-7AM, please contact night-coverage [email or web address] Password [facility]  05/25/2019, 3:19 PM

## 2019-05-25 NOTE — Progress Notes (Addendum)
Subjective:    Patient ID: [name], female    DOB: Sep 28, 1941, 78 y.o.   MRN: [id number]  HPI Patient recently underwent surgical excision of a mass in her brain due to metastatic lung cancer.  She was discharged from the hospital June 13.  Went to the emergency room with shortness of breath July 12 and was diagnosed with possible pneumonitis.  She presents today with right-sided pleurisy, increasing shortness of breath, hypoxia with ambulation despite being on oxygen 3 L via nasal cannula.  She states that her oxygen will drop into the 80s with minimal activity.  She feels extremely weak.  She is fallen several times.  Today she is tachycardic at rest simply sitting in the office.  Her heart rate is 120 bpm although regular.  Oxygen is 91% sitting on 3 L via nasal cannula but rapidly drops into the 80s with ambulation.  She has fine crackles in the right posterior lung in the lower lobe, middle lobe, upper lobe is clear.  There are also some faint crackles in the left lung.  There is no pitting edema in her legs.  Concern is for right-sided pneumonia.  However the patient has been on antibiotics for a presumed sinus infection over the last 3 weeks.  She was initially placed on Omnicef along with a prednisone taper for sinusitis in July and then was given an additional 10 days of Augmentin due to persistent sinus inflammation.  Both of these visits were telephone visits.  After completing the Augmentin however the patient developed the right-sided pleurisy and chest pain and shortness of breath last week.  Today she did not feels strong enough even to come to the office however she somehow made it. Past Medical History:  Diagnosis Date  . 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 Allergic rhinitis   . 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 Anxiety disorder   . Aortic aneurysm ([place])   . Brain metastasis ([place])    lung cancer s/p resection 7/20   . Complication of anesthesia 2011   bloodpressure dropped during colonoscopy, not problems since  . Compression fracture   . COPD (chronic obstructive pulmonary disease) ([place])   . Depression   . Diastolic dysfunction   . Diverticulosis   . Dysphagia   . Emphysema lung ([place]) 11/08/2014  . Headache   . Hiatal hernia   . History of kidney stones   . Hypothyroidism   . IBS (irritable bowel syndrome)   . Iron deficiency anemia due to chronic blood loss 12/05/2016  . On home O2 12/15/2013   chronic hypoxia  . Pneumonia   . PUD (peptic ulcer disease)    74yrs  . Shortness of breath    with exertion  . SIADH (syndrome of inappropriate ADH production) ([place])   . Trigeminal neuralgia    Past Surgical History:  Procedure Laterality Date  . ABDOMINAL HYSTERECTOMY    . APPLICATION OF CRANIAL NAVIGATION N/A 03/18/2019   Procedure: APPLICATION OF CRANIAL NAVIGATION;  Surgeon: Consuella Lose, MD;  Location: [facility];  Service: Neurosurgery;  Laterality: N/A;  . BACK SURGERY  January 13, 2014  . CATARACT EXTRACTION    . CATARACT EXTRACTION W/PHACO  09/02/2012   Procedure: CATARACT EXTRACTION PHACO AND INTRAOCULAR LENS PLACEMENT (IOC);  Surgeon: Elta Guadeloupe T. Gershon Crane, MD;  Location: [facility];  Service: Ophthalmology;  Laterality: Left;  CDE:10.35  . CHOLECYSTECTOMY    .  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 COLONOSCOPY N/A 11/23/2014   Dr. Oneida Alar: moderate diverticulosis, hemorrhoids, redundant colon. next TCS in 10-15 years.   . CRANIOTOMY Left 03/18/2019   Procedure: Left stereotactic craniotomy for tumor resection;  Surgeon: Consuella Lose, MD;  Location: [facility];  Service: Neurosurgery;  Laterality: Left;  Left stereotactic craniotomy for tumor resection  . ENDARTERECTOMY Right 08/21/2013   Procedure: RIGHT CAROTID ANEURYSM RESECTION;  Surgeon: Rosetta Posner, MD;  Location: [facility];  Service: Vascular;  Laterality: Right;  . ESOPHAGEAL DILATION  02/24/2018    Procedure: ESOPHAGEAL DILATION;  Surgeon: Danie Binder, MD;  [facility];  Service: Endoscopy;;  . ESOPHAGOGASTRODUODENOSCOPY  11/13/2009   w/dilation to 30mm, gastric ulceration (H.Pylori) s/p treatment  . ESOPHAGOGASTRODUODENOSCOPY  11/21/2009   distal esophageal web, gastritis  . ESOPHAGOGASTRODUODENOSCOPY  03/14/12   XHB:ZJIRCVELF in the distal esophagus/Mild gastritis/small HH. + H.pylori, prescribed Pylera. Finished treatment.   . ESOPHAGOGASTRODUODENOSCOPY N/A 12/13/2017   Procedure: ESOPHAGOGASTRODUODENOSCOPY (EGD);  Surgeon: Danie Binder, MD;  [facility];  Service: Endoscopy;  Laterality: N/A;  8:30am  . ESOPHAGOGASTRODUODENOSCOPY N/A 02/24/2018   Procedure: ESOPHAGOGASTRODUODENOSCOPY (EGD);  Surgeon: Danie Binder, MD;  [facility];  Service: Endoscopy;  Laterality: N/A;  11:00am  . fatty tumor removal from lt groin    . FRACTURE SURGERY Left    hand and left ring finger  . GIVENS CAPSULE STUDY N/A 12/26/2017   Procedure: GIVENS CAPSULE STUDY;  Surgeon: Danie Binder, MD;  [facility];  Service: Endoscopy;  Laterality: N/A;  7:30am  . GIVENS CAPSULE STUDY N/A 02/24/2018   Procedure: GIVENS CAPSULE STUDY;  Surgeon: Danie Binder, MD;  [facility];  Service: Endoscopy;  Laterality: N/A;  . KNEE SURGERY     patella tendon repair june 2019 [facility]  . LUNG CANCER SURGERY  12/2010   Left VATS, minithoracotomy, LLL superior segmentectomy  . ORIF PATELLA Right 03/18/2018   Procedure: OPEN REDUCTION INTERNAL (ORIF) FIXATION RIGHT PATELLA;  Surgeon: Carole Civil, MD;  Location: [facility];  Service: Orthopedics;  Laterality: Right;  . ORIF WRIST FRACTURE Left 04/09/2014   Procedure: OPEN REDUCTION INTERNAL FIXATION (ORIF) WRIST FRACTURE;  Surgeon: Renette Butters, MD;  Location: [facility];  Service: Orthopedics;  Laterality: Left;  . PERCUTANEOUS PINNING Left 04/09/2014   Procedure: PERCUTANEOUS PINNING EXTREMITY;  Surgeon: Renette Butters, MD;  Location: [facility];  Service: Orthopedics;  Laterality: Left;  . SAVORY DILATION N/A 12/13/2017   Procedure: SAVORY DILATION;  Surgeon: Danie Binder, MD;  [facility];  Service: Endoscopy;  Laterality: N/A;  . TUBAL LIGATION    . vocal cord surgery  02/06/2011   laryngoscopy with bilateral vocal cord Radiesse injection for vocal cord paralysis  . YAG LASER APPLICATION Left 81/10/7508   Procedure: YAG LASER APPLICATION;  Surgeon: Rutherford Guys, MD;  Location: [facility];  Service: Ophthalmology;  Laterality: Left;   Current Outpatient Medications on File Prior to Visit  Medication Sig Dispense Refill  . acetaminophen (TYLENOL) 325 MG tablet Take 650 mg by mouth every 6 (six) hours as needed for moderate pain or headache.     . albuterol (VENTOLIN HFA) 108 (90 Base) MCG/ACT inhaler Inhale 2 puffs into the lungs every 6 (six) hours as needed for wheezing or shortness of breath. 18 g 2  . ALPRAZolam (XANAX) 0.25 MG tablet Take 1 tablet (0.25  mg total) by mouth 2 (two) times daily as needed for anxiety. 30 tablet 0  . amoxicillin-clavulanate (AUGMENTIN) 875-125 MG tablet Take 1 tablet by mouth 2 (two) times daily. Please deliver 20 tablet 0  . citalopram (CELEXA) 40 MG tablet Take 0.5 tablets (20 mg total) by mouth at bedtime.    Marland Kitchen denosumab (PROLIA) 60 MG/ML SOSY injection Inject 60 mg into the skin every 6 (six) months. 1 mL 0  . ferrous sulfate 325 (65 FE) MG tablet Take 325 mg by mouth daily with breakfast.    . fluticasone (FLONASE) 50 MCG/ACT nasal spray Place 2 sprays into both nostrils daily. 16 g 6  . levETIRAcetam (KEPPRA) 500 MG tablet Take 1 tablet (500 mg total) by mouth 2 (two) times daily. (Patient taking differently: Take 500 mg by mouth at bedtime. ) 60 tablet 2  . levothyroxine (SYNTHROID) 50 MCG tablet Take 1 tablet (50 mcg total) by mouth daily before breakfast.    . Multiple Vitamin (MULTIVITAMIN WITH MINERALS) TABS tablet Take 1 tablet by mouth daily.    .  ondansetron (ZOFRAN ODT) 4 MG disintegrating tablet Take 1 tablet (4 mg total) by mouth every 4 (four) hours as needed for nausea or vomiting.    . OXYGEN Inhale 2 L into the lungs continuous.    . pantoprazole (PROTONIX) 40 MG tablet Take 1 tablet (40 mg total) by mouth daily before breakfast. 90 tablet 3  . senna-docusate (SENOKOT-S) 8.6-50 MG tablet Take 1 tablet by mouth at bedtime as needed for mild constipation.    Marland Kitchen umeclidinium-vilanterol (ANORO ELLIPTA) 62.5-25 MCG/INH AEPB Inhale 1 puff into the lungs daily. 90 each 3   No current facility-administered medications on file prior to visit.    Allergies  Allergen Reactions  . Ciprofloxacin Hcl Hives  . Sulfonamide Derivatives Hives and Rash  . Iohexol Other (See Comments)    UNSPECIFIED REACTION  Desc: Per [facility], pt is allergic to IV contrast, no type of reaction was available. Pt cannot remember but first reacted around 15 yrs ago from an IVP. Notes were date from 2009 at urology center., Onset Date: [id number]   . Prozac [Fluoxetine Hcl] Rash   Social History   Socioeconomic History  . Marital status: Divorced    Spouse name: Not on file  . Number of children: Not on file  . Years of education: Not on file  . Highest education level: Not on file  Occupational History  . Not on file  Social Needs  . Financial resource strain: Somewhat hard  . Food insecurity    Worry: Never true    Inability: Never true  . Transportation needs    Medical: No    Non-medical: No  Tobacco Use  . Smoking status: Former Smoker    Packs/day: 0.50    Years: 20.00    Pack years: 10.00    Types: Cigarettes    Quit date: 12/21/2010    Years since quitting: 8.4  . Smokeless tobacco: Never Used  . Tobacco comment: smoking cessation info given and reviewed   Substance and Sexual Activity  . Alcohol use: Yes    Alcohol/week: 0.0 standard drinks    Comment: seldom  . Drug use: No  . Sexual activity: Yes    Birth  control/protection: Surgical  Lifestyle  . Physical activity    Days per week: 0 days    Minutes per session: 0 min  . Stress: Not at all  Relationships  . Social connections  Talks on phone: Once a week    Gets together: Never    Attends religious service: Never    Active member of club or organization: No    Attends meetings of clubs or organizations: Never    Relationship status: Divorced  . Intimate partner violence    Fear of current or ex partner: No    Emotionally abused: No    Physically abused: No    Forced sexual activity: No  Other Topics Concern  . Not on file  Social History Narrative  . Not on file      Review of Systems  All other systems reviewed and are negative.      Objective:   Physical Exam Constitutional:      Appearance: She is ill-appearing.  Cardiovascular:     Rate and Rhythm: Tachycardia present.     Heart sounds: Normal heart sounds.  Pulmonary:     Effort: No respiratory distress.     Breath sounds: Examination of the right-middle field reveals rhonchi and rales. Examination of the right-lower field reveals rhonchi and rales. Rhonchi and rales present. No wheezing.  Chest:     Chest wall: Tenderness present.  Abdominal:     General: Abdomen is flat. Bowel sounds are normal.     Palpations: Abdomen is soft.  Musculoskeletal:     Right lower leg: No edema.     Left lower leg: No edema.  Neurological:     Mental Status: She is alert.           Assessment & Plan:  The primary encounter diagnosis was Community acquired pneumonia of right lower lobe of lung ([place]). Diagnoses of Dehydration and Hypoxia were also pertinent to this visit. Patient is ill-appearing.  She appears dehydrated.  She is sitting in her chair slumped over barely able to hold her head up.  She is tachycardic even at rest and hypoxic with minimal activity despite being on oxygen 3 L via nasal cannula.  Physical exam is significant for tachycardia, dehydration,  and prominent rhonchi and rails in the right lung.  I am concerned about community-acquired pneumonia and what is more concerning is the patient has been on 3 weeks of Augmentin and Omnicef for a presumed sinus infection prior to this.  Therefore I think it safe to say that she has community-acquired pneumonia patient has failed outpatient antibiotics and needs IV antibiotics in the hospital.  Patient would also be at high risk for pulmonary embolism given her recent surgery.  Therefore I believe she needs emergency room evaluation if she does not appear stable to be treated as an outpatient based on her clinical appearance today.  Patient is directed to the emergency room and they will be notified of her pending arrival.  I also feel the patient would benefit from a home health referral after she is discharged from the hospital for physical therapy due to deconditioning.  I will schedule home health consultation after discharge from the hospital.

## 2019-05-25 NOTE — ED Triage Notes (Signed)
Pt has been battling pneumonia for the last month. Has gone through 3 rounds of antibiotics with no progress. Pt 95% on 4L. Pt is having right sided pain in her lungs when she inhales as well.

## 2019-05-25 NOTE — ED Provider Notes (Signed)
[facility] EMERGENCY DEPARTMENT Provider Note   CSN: [id number] Arrival date & time: 05/25/19  1011     History   Chief Complaint Chief Complaint  Patient presents with  . Shortness of Breath    HPI [name] is a 78 y.o. female.     HPI  This patient is a very pleasant 78 year old female who has a history of adenocarcinoma of the lung, cancer of the brain which was recently picked up after having a small stroke, thought to be lung cancer metastasis.  She has a history of COPD and wears 2 L by nasal cannula chronically according to her report.  She has been treated several times over the last month according to my evaluation and investigation into the medical record and especially from her family doctor's notes, initially with a cephalosporin followed up by a course of Augmentin for possible sinusitis.  She underwent resection of her brain tumor 2 months ago, she was seen in the emergency department a month later for pneumonitis and has had an ongoing and persistent shortness of breath since that time.  When she arrived at the office to be evaluated today she was hypoxic tachycardic and appeared to be weak and in some respiratory distress.  Reporting in the medical record her oxygen was around 80% ambulating, 91% at rest on her baseline 2 to 3 L, she was tachycardic to 120 and appeared weak.  The patient reports that she has had some increased coughing and phlegm, denies any swelling of her legs, she denies chest pain but rather has a right-sided flank pain.  She has never had blood clots.  Review of the medical record shows that the patient had a CT angiogram performed on April 19, 2019, no pulmonary embolus or aneurysm or dissection, small pericardial effusion, emphysema was seen with scarring in the lower lobes bilaterally and postoperative changes in the left lower lobe.  No pneumonia seen, possible pneumonitis.  The patient denies fevers or chills or exposure to COVID positive  patient's  Past Medical History:  Diagnosis Date  . Adenocarcinoma of lung ([place]) 12/2010   left lung/surg only  . Allergic rhinitis   . 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 Anxiety disorder   . Aortic aneurysm ([place])   . Brain metastasis ([place])    lung cancer s/p resection 7/20  . Complication of anesthesia 2011   bloodpressure dropped during colonoscopy, not problems since  . Compression fracture   . COPD (chronic obstructive pulmonary disease) ([place])   . Depression   . Diastolic dysfunction   . Diverticulosis   . Dysphagia   . Emphysema lung ([place]) 11/08/2014  . Headache   . Hiatal hernia   . History of kidney stones   . Hypothyroidism   . IBS (irritable bowel syndrome)   . Iron deficiency anemia due to chronic blood loss 12/05/2016  . On home O2 12/15/2013   chronic hypoxia  . Pneumonia   . PUD (peptic ulcer disease)    85yrs  . Shortness of breath    with exertion  . SIADH (syndrome of inappropriate ADH production) ([place])   . Trigeminal neuralgia     Patient Active Problem List   Diagnosis Date Noted  . Brain metastasis ([place])   . Brain tumor ([place]) 03/18/2019  . Essential hypertension 03/12/2019  . Brain metastases ([place]) 03/06/2019  . Cytotoxic brain edema ([place]) 03/04/2019  . ICH (intracerebral hemorrhage) (H[facility]) - L parietal  lobe 03/03/2019  . Osteoporosis 05/16/2018  . S/P ORIF (open reduction internal fixation) fracture right patella 03/18/18 03/26/2018  . Cellulitis, wound, post-operative 03/22/2018  . Fracture of right patella   . Normocytic anemia   . Esophageal dysphagia 11/27/2017  . Heme positive stool 11/27/2017  . Iron deficiency anemia 12/05/2016  . SIADH (syndrome of inappropriate ADH production) ([place])   . Polypharmacy 09/14/2016  . Muscle weakness (generalized)   . Syncope 09/10/2016  . Rib pain on left side   . Hypersomnia 02/16/2016  . Acute bronchitis 02/16/2016  . Exertional dyspnea 12/20/2015  . HLD  (hyperlipidemia) 11/23/2014  . Change in bowel habits 11/10/2014  . Abdominal pain, left lower quadrant 11/10/2014  . Emphysema lung ([place]) 11/08/2014  . Helicobacter pylori gastritis 07/30/2014  . Acute on chronic respiratory failure with hypoxia ([place]) 04/10/2014  . Wrist fracture 04/09/2014  . 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 T12 burst fracture ([place]) 01/13/2014  . Constipation 12/14/2013  . Acute respiratory failure with hypoxia ([place]) 12/14/2013  . COPD (chronic obstructive pulmonary disease) ([place]) 12/14/2013  . Compression fracture of thoracic vertebra ([place]) 12/14/2013  . Hypoxia 12/13/2013  . Compression fracture 12/13/2013  . Diastolic dysfunction   . 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 AAA (abdominal aortic aneurysm) without rupture ([place]) 02/26/2012  . Dyspepsia 02/11/2012  . Aneurysm ([place]) 02/11/2012  . Trigeminal neuralgia   . Epigastric pain 02/21/2011  . Dysphagia 02/21/2011  . Adenocarcinoma of lung ([place]) 12/07/2010  . HELICOBACTER PYLORI GASTRITIS, HX OF 12/30/2009  . CONTRAST DYE ALLERGY 12/30/2009    Past Surgical History:  Procedure Laterality Date  . ABDOMINAL HYSTERECTOMY    . APPLICATION OF CRANIAL NAVIGATION N/A 03/18/2019   Procedure: APPLICATION OF CRANIAL NAVIGATION;  Surgeon: Consuella Lose, MD;  Location: [place];  Service: Neurosurgery;  Laterality: N/A;  . BACK SURGERY  January 13, 2014  . CATARACT EXTRACTION    . CATARACT EXTRACTION W/PHACO  09/02/2012   Procedure: CATARACT EXTRACTION PHACO AND INTRAOCULAR LENS PLACEMENT (IOC);  Surgeon: Elta Guadeloupe T. Gershon Crane, MD;  Location: [facility];  Service: Ophthalmology;  Laterality: Left;  CDE:10.35  . CHOLECYSTECTOMY    . 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 COLONOSCOPY N/A 11/23/2014   Dr. Oneida Alar: moderate diverticulosis, hemorrhoids, redundant colon. next TCS in 10-15 years.   . CRANIOTOMY Left 03/18/2019   Procedure:  Left stereotactic craniotomy for tumor resection;  Surgeon: Consuella Lose, MD;  Location: [place];  Service: Neurosurgery;  Laterality: Left;  Left stereotactic craniotomy for tumor resection  . ENDARTERECTOMY Right 08/21/2013   Procedure: RIGHT CAROTID ANEURYSM RESECTION;  Surgeon: Rosetta Posner, MD;  Location: [place];  Service: Vascular;  Laterality: Right;  . ESOPHAGEAL DILATION  02/24/2018   Procedure: ESOPHAGEAL DILATION;  Surgeon: Danie Binder, MD;  [facility];  Service: Endoscopy;;  . ESOPHAGOGASTRODUODENOSCOPY  11/13/2009   w/dilation to 39mm, gastric ulceration (H.Pylori) s/p treatment  . ESOPHAGOGASTRODUODENOSCOPY  11/21/2009   distal esophageal web, gastritis  . ESOPHAGOGASTRODUODENOSCOPY  03/14/12   BMW:[id number] in the distal esophagus/Mild gastritis/small HH. + H.pylori, prescribed Pylera. Finished treatment.   . ESOPHAGOGASTRODUODENOSCOPY N/A 12/13/2017   Procedure: ESOPHAGOGASTRODUODENOSCOPY (EGD);  Surgeon: Danie Binder, MD;  [facility];  Service: Endoscopy;  Laterality: N/A;  8:30am  . ESOPHAGOGASTRODUODENOSCOPY N/A 02/24/2018   Procedure: ESOPHAGOGASTRODUODENOSCOPY (EGD);  Surgeon: Danie Binder, MD;  [facility];  Service:  Endoscopy;  Laterality: N/A;  11:00am  . fatty tumor removal from lt groin    . FRACTURE SURGERY Left    hand and left ring finger  . GIVENS CAPSULE STUDY N/A 12/26/2017   Procedure: GIVENS CAPSULE STUDY;  Surgeon: Danie Binder, MD;  [facility];  Service: Endoscopy;  Laterality: N/A;  7:30am  . GIVENS CAPSULE STUDY N/A 02/24/2018   Procedure: GIVENS CAPSULE STUDY;  Surgeon: Danie Binder, MD;  [facility];  Service: Endoscopy;  Laterality: N/A;  . KNEE SURGERY     patella tendon repair june 2019 [place]  . LUNG CANCER SURGERY  12/2010   Left VATS, minithoracotomy, LLL superior segmentectomy  . ORIF PATELLA Right 03/18/2018   Procedure: OPEN REDUCTION INTERNAL (ORIF) FIXATION RIGHT  PATELLA;  Surgeon: Carole Civil, MD;  Location: [facility];  Service: Orthopedics;  Laterality: Right;  . ORIF WRIST FRACTURE Left 04/09/2014   Procedure: OPEN REDUCTION INTERNAL FIXATION (ORIF) WRIST FRACTURE;  Surgeon: Renette Butters, MD;  Location: [facility];  Service: Orthopedics;  Laterality: Left;  . PERCUTANEOUS PINNING Left 04/09/2014   Procedure: PERCUTANEOUS PINNING EXTREMITY;  Surgeon: Renette Butters, MD;  Location: [facility];  Service: Orthopedics;  Laterality: Left;  . SAVORY DILATION N/A 12/13/2017   Procedure: SAVORY DILATION;  Surgeon: Danie Binder, MD;  [facility];  Service: Endoscopy;  Laterality: N/A;  . TUBAL LIGATION    . vocal cord surgery  02/06/2011   laryngoscopy with bilateral vocal cord Radiesse injection for vocal cord paralysis  . YAG LASER APPLICATION Left 82/95/6213   Procedure: YAG LASER APPLICATION;  Surgeon: Rutherford Guys, MD;  Location: [facility];  Service: Ophthalmology;  Laterality: Left;     OB History    Gravida  2   Para  2   Term  2   Preterm      AB      Living  2     SAB      TAB      Ectopic      Multiple      Live Births               Home Medications    Prior to Admission medications   Medication Sig Start Date End Date Taking? Authorizing Provider  acetaminophen (TYLENOL) 325 MG tablet Take 650 mg by mouth every 6 (six) hours as needed for moderate pain or headache.     [provider]  albuterol (VENTOLIN HFA) 108 (90 Base) MCG/ACT inhaler Inhale 2 puffs into the lungs every 6 (six) hours as needed for wheezing or shortness of breath. 05/14/19   [name], MD  ALPRAZolam Duanne Moron) 0.25 MG tablet Take 1 tablet (0.25 mg total) by mouth 2 (two) times daily as needed for anxiety. 03/12/19   Donzetta Starch, NP  amoxicillin-clavulanate (AUGMENTIN) 875-125 MG tablet Take 1 tablet by mouth 2 (two) times daily. Please deliver 05/07/19   [name], MD  citalopram (CELEXA) 40 MG tablet Take 0.5 tablets (20  mg total) by mouth at bedtime. 03/12/19   Donzetta Starch, NP  denosumab (PROLIA) 60 MG/ML SOSY injection Inject 60 mg into the skin every 6 (six) months. 04/21/19   [name], MD  ferrous sulfate 325 (65 FE) MG tablet Take 325 mg by mouth daily with breakfast.    [provider]  fluticasone (FLONASE) 50 MCG/ACT nasal spray Place 2 sprays into both nostrils daily. 12/30/18  [name], MD  levETIRAcetam (KEPPRA) 500 MG tablet Take 1 tablet (500 mg total) by mouth 2 (two) times daily. Patient taking differently: Take 500 mg by mouth at bedtime.  03/21/19   Costella, Vista Mink, PA-C  levothyroxine (SYNTHROID) 50 MCG tablet Take 1 tablet (50 mcg total) by mouth daily before breakfast. 03/12/19   Donzetta Starch, NP  Multiple Vitamin (MULTIVITAMIN WITH MINERALS) TABS tablet Take 1 tablet by mouth daily.    [provider]  ondansetron (ZOFRAN ODT) 4 MG disintegrating tablet Take 1 tablet (4 mg total) by mouth every 4 (four) hours as needed for nausea or vomiting. 03/12/19   Donzetta Starch, NP  OXYGEN Inhale 2 L into the lungs continuous.    [provider]  pantoprazole (PROTONIX) 40 MG tablet Take 1 tablet (40 mg total) by mouth daily before breakfast. 02/05/19   Annitta Needs, NP  senna-docusate (SENOKOT-S) 8.6-50 MG tablet Take 1 tablet by mouth at bedtime as needed for mild constipation. 03/12/19   Donzetta Starch, NP  umeclidinium-vilanterol (ANORO ELLIPTA) 62.5-25 MCG/INH AEPB Inhale 1 puff into the lungs daily. 06/20/18   [name], MD    Family History Family History  Problem Relation Age of Onset  . Pulmonary embolism Mother   . Heart attack Father   . Hypertension Father   . Liver cancer Sister   . Cancer Sister   . Cancer Brother   . Cancer Daughter   . Colon cancer Neg Hx     Social History Social History   Tobacco Use  . Smoking status: Former Smoker    Packs/day: 0.50    Years: 20.00    Pack years: 10.00    Types: Cigarettes    Quit  date: 12/21/2010    Years since quitting: 8.4  . Smokeless tobacco: Never Used  . Tobacco comment: smoking cessation info given and reviewed   Substance Use Topics  . Alcohol use: Yes    Alcohol/week: 0.0 standard drinks    Comment: seldom  . Drug use: No     Allergies   Ciprofloxacin hcl, Sulfonamide derivatives, Iohexol, and Prozac [fluoxetine hcl]   Review of Systems Review of Systems  All other systems reviewed and are negative.    Physical Exam Updated Vital Signs BP 123/87 (BP Location: Right Arm)   Pulse 95   Temp 98.2 F (36.8 C)   Resp 14   Ht 1.651 m (5\' 5" )   Wt 53.1 kg   SpO2 99%   BMI 19.47 kg/m   Physical Exam Vitals signs and nursing note reviewed.  Constitutional:      General: She is not in acute distress.    Appearance: She is well-developed.  HENT:     Head: Normocephalic and atraumatic.     Mouth/Throat:     Pharynx: No oropharyngeal exudate.  Eyes:     General: No scleral icterus.       Right eye: No discharge.        Left eye: No discharge.     Conjunctiva/sclera: Conjunctivae normal.     Pupils: Pupils are equal, round, and reactive to light.  Neck:     Musculoskeletal: Normal range of motion and neck supple.     Thyroid: No thyromegaly.     Vascular: No JVD.  Cardiovascular:     Rate and Rhythm: Normal rate and regular rhythm.     Heart sounds: Normal heart sounds. No murmur. No friction rub. No gallop.  Pulmonary:     Effort: Pulmonary effort is normal. No respiratory distress.     Breath sounds: Rales present. No wheezing.     Comments: Subtle rales at the bases bilaterally, the patient is in no respiratory distress with a normal respiratory pattern, no wheezing, speaks in full sentences, no accessory muscle use.  Oxygen of 99% on 2 L at rest, heart rate of 100 Abdominal:     General: Bowel sounds are normal. There is no distension.     Palpations: Abdomen is soft. There is no mass.     Tenderness: There is no abdominal  tenderness.  Musculoskeletal: Normal range of motion.        General: No tenderness.  Lymphadenopathy:     Cervical: No cervical adenopathy.  Skin:    General: Skin is warm and dry.     Findings: No erythema or rash.  Neurological:     Mental Status: She is alert.     Coordination: Coordination normal.  Psychiatric:        Behavior: Behavior normal.      ED Treatments / Results  Labs (all labs ordered are listed, but only abnormal results are displayed) Labs Reviewed  SARS CORONAVIRUS 2 ([facility])  CBC WITH DIFFERENTIAL/PLATELET  COMPREHENSIVE METABOLIC PANEL  BRAIN NATRIURETIC PEPTIDE  LACTIC ACID, PLASMA  TROPONIN I (HIGH SENSITIVITY)    EKG None  Radiology No results found.  Procedures Procedures (including critical care time)  Medications Ordered in ED Medications - No data to display   Initial Impression / Assessment and Plan / ED Course  I have reviewed the triage vital signs and the nursing notes.  Pertinent labs & imaging results that were available during my care of the patient were reviewed by me and considered in my medical decision making (see chart for details).  Clinical Course as of May 24 1332  Mon May 25, 2019  1213 I have personally viewed and interpreted the x-ray of the chest.  2 view, PA and lateral.  On my interpretation the patient does appear to have some hazy opacities especially at the right base.  This seems to be slightly worse than previous.  Her labs otherwise are unremarkable with negative metabolic panel, CBC without leukocytosis, lactic acid that is normal, troponin that is normal.  COVID test pending.    [BM]  1310 The patient has ambulated though with some difficulty and dyspnea.  Oxygen dropping to 88% on her home oxygen.  We will give her nebulized treatment, antibiotics, discussed with hospitalist for admission at the request of family doctor   [BM]  1326 Discussed case with  hospitalist who will see in evaluation for possible admission   [BM]    Clinical Course User Index [BM] Noemi Chapel, MD       The patient is in no distress, she appears well however she has had some increasing shortness of breath with tachypnea and has been measuring tachycardia and hypoxia at home with minimal ambulation or exertion.  She has no history of congestive heart failure.  Echocardiogram that was performed on Mar 07, 2019 showed an ejection fraction of 60 to 65%, there was some impaired relaxation.  Mild concentric left ventricular hypertrophy.  At this time the patient will need evaluation for other causes of shortness of breath including ongoing COPD (despite recurrent antibiotics and steroids over the last couple of months), congestive heart failure, ischemia, infection, COVID, less likely to be pulmonary embolism  though she has had a history of cancer.  She did have a negative PE study performed a month ago.  Final Clinical Impressions(s) / ED Diagnoses   Final diagnoses:  Community acquired pneumonia of right lower lobe of lung ([place])  Hypoxia      Noemi Chapel, MD 05/25/19 [phone]

## 2019-05-26 ENCOUNTER — Observation Stay (HOSPITAL_COMMUNITY): Payer: PPO

## 2019-05-26 DIAGNOSIS — Z961 Presence of intraocular lens: Secondary | ICD-10-CM | POA: Diagnosis present

## 2019-05-26 DIAGNOSIS — R0902 Hypoxemia: Secondary | ICD-10-CM | POA: Diagnosis present

## 2019-05-26 DIAGNOSIS — R1313 Dysphagia, pharyngeal phase: Secondary | ICD-10-CM | POA: Diagnosis present

## 2019-05-26 DIAGNOSIS — Z20828 Contact with and (suspected) exposure to other viral communicable diseases: Secondary | ICD-10-CM | POA: Diagnosis present

## 2019-05-26 DIAGNOSIS — Z85118 Personal history of other malignant neoplasm of bronchus and lung: Secondary | ICD-10-CM | POA: Diagnosis not present

## 2019-05-26 DIAGNOSIS — M40209 Unspecified kyphosis, site unspecified: Secondary | ICD-10-CM | POA: Diagnosis present

## 2019-05-26 DIAGNOSIS — J439 Emphysema, unspecified: Secondary | ICD-10-CM | POA: Diagnosis present

## 2019-05-26 DIAGNOSIS — I69391 Dysphagia following cerebral infarction: Secondary | ICD-10-CM | POA: Diagnosis not present

## 2019-05-26 DIAGNOSIS — K5900 Constipation, unspecified: Secondary | ICD-10-CM | POA: Diagnosis present

## 2019-05-26 DIAGNOSIS — J9601 Acute respiratory failure with hypoxia: Secondary | ICD-10-CM | POA: Diagnosis not present

## 2019-05-26 DIAGNOSIS — Z9049 Acquired absence of other specified parts of digestive tract: Secondary | ICD-10-CM | POA: Diagnosis not present

## 2019-05-26 DIAGNOSIS — E039 Hypothyroidism, unspecified: Secondary | ICD-10-CM | POA: Diagnosis present

## 2019-05-26 DIAGNOSIS — Z87442 Personal history of urinary calculi: Secondary | ICD-10-CM | POA: Diagnosis not present

## 2019-05-26 DIAGNOSIS — Z8249 Family history of ischemic heart disease and other diseases of the circulatory system: Secondary | ICD-10-CM | POA: Diagnosis not present

## 2019-05-26 DIAGNOSIS — Z87891 Personal history of nicotine dependence: Secondary | ICD-10-CM | POA: Diagnosis not present

## 2019-05-26 DIAGNOSIS — Z8711 Personal history of peptic ulcer disease: Secondary | ICD-10-CM | POA: Diagnosis not present

## 2019-05-26 DIAGNOSIS — C3490 Malignant neoplasm of unspecified part of unspecified bronchus or lung: Secondary | ICD-10-CM | POA: Diagnosis not present

## 2019-05-26 DIAGNOSIS — Z79890 Hormone replacement therapy (postmenopausal): Secondary | ICD-10-CM | POA: Diagnosis not present

## 2019-05-26 DIAGNOSIS — R0602 Shortness of breath: Secondary | ICD-10-CM | POA: Diagnosis not present

## 2019-05-26 DIAGNOSIS — Z79891 Long term (current) use of opiate analgesic: Secondary | ICD-10-CM | POA: Diagnosis not present

## 2019-05-26 DIAGNOSIS — J449 Chronic obstructive pulmonary disease, unspecified: Secondary | ICD-10-CM | POA: Diagnosis not present

## 2019-05-26 DIAGNOSIS — Z9842 Cataract extraction status, left eye: Secondary | ICD-10-CM | POA: Diagnosis not present

## 2019-05-26 DIAGNOSIS — J690 Pneumonitis due to inhalation of food and vomit: Secondary | ICD-10-CM | POA: Diagnosis present

## 2019-05-26 DIAGNOSIS — J9621 Acute and chronic respiratory failure with hypoxia: Secondary | ICD-10-CM | POA: Diagnosis present

## 2019-05-26 DIAGNOSIS — Z85841 Personal history of malignant neoplasm of brain: Secondary | ICD-10-CM | POA: Diagnosis not present

## 2019-05-26 DIAGNOSIS — C7931 Secondary malignant neoplasm of brain: Secondary | ICD-10-CM | POA: Diagnosis present

## 2019-05-26 DIAGNOSIS — Z90710 Acquired absence of both cervix and uterus: Secondary | ICD-10-CM | POA: Diagnosis not present

## 2019-05-26 DIAGNOSIS — Z800 Family history of malignant neoplasm of digestive organs: Secondary | ICD-10-CM | POA: Diagnosis not present

## 2019-05-26 DIAGNOSIS — M8000XA Age-related osteoporosis with current pathological fracture, unspecified site, initial encounter for fracture: Secondary | ICD-10-CM | POA: Diagnosis present

## 2019-05-26 LAB — CBC
HCT: 38.2 % (ref 36.0–46.0)
Hemoglobin: 12.2 g/dL (ref 12.0–15.0)
MCH: 29.1 pg (ref 26.0–34.0)
MCHC: 31.9 g/dL (ref 30.0–36.0)
MCV: 91.2 fL (ref 80.0–100.0)
Platelets: 358 10*3/uL (ref 150–400)
RBC: 4.19 MIL/uL (ref 3.87–5.11)
RDW: 12.4 % (ref 11.5–15.5)
WBC: 5.8 10*3/uL (ref 4.0–10.5)
nRBC: 0 % (ref 0.0–0.2)

## 2019-05-26 LAB — BASIC METABOLIC PANEL
Anion gap: 12 (ref 5–15)
BUN: 18 mg/dL (ref 8–23)
CO2: 23 mmol/L (ref 22–32)
Calcium: 9.4 mg/dL (ref 8.9–10.3)
Chloride: 98 mmol/L (ref 98–111)
Creatinine, Ser: 0.48 mg/dL (ref 0.44–1.00)
GFR calc Af Amer: >60 mL/min (ref >60)
GFR calc non Af Amer: >60 mL/min (ref >60)
Glucose, Bld: 108 mg/dL — ABNORMAL HIGH (ref 70–99)
Potassium: 3.8 mmol/L (ref 3.5–5.1)
Sodium: 133 mmol/L — ABNORMAL LOW (ref 135–145)

## 2019-05-26 LAB — STREP PNEUMONIAE URINARY ANTIGEN: Strep Pneumo Urinary Antigen: NEGATIVE

## 2019-05-26 MED ORDER — DIPHENHYDRAMINE HCL 25 MG PO CAPS: 50.0000 mg | ORAL_CAPSULE | Freq: Once | ORAL | Status: DC

## 2019-05-26 MED ORDER — IOHEXOL 350 MG/ML SOLN
100.0000 mL | Freq: Once | INTRAVENOUS | Status: AC | PRN
  Administered 2019-05-26: 15:00:00 100 mL via INTRAVENOUS

## 2019-05-26 MED ORDER — DIPHENHYDRAMINE HCL 50 MG/ML IJ SOLN
50.0000 mg | Freq: Once | INTRAMUSCULAR | Status: AC
  Administered 2019-05-26: 14:00:00 50 mg via INTRAVENOUS
  Filled 2019-05-26: qty 1

## 2019-05-26 MED ORDER — DIPHENHYDRAMINE HCL 50 MG/ML IJ SOLN
50.0000 mg | Freq: Once | INTRAMUSCULAR | Status: AC
  Filled 2019-05-26: qty 1

## 2019-05-26 MED ORDER — METHYLPREDNISOLONE SODIUM SUCC 40 MG IJ SOLR
40.0000 mg | INTRAMUSCULAR | Status: DC
  Administered 2019-05-26: 40 mg via INTRAVENOUS
  Filled 2019-05-26: qty 1

## 2019-05-26 MED ORDER — POLYVINYL ALCOHOL 1.4 % OP SOLN
1.0000 [drp] | OPHTHALMIC | Status: DC | PRN
  Administered 2019-05-26: 21:00:00 1 [drp] via OPHTHALMIC
  Filled 2019-05-26: qty 15

## 2019-05-26 MED ORDER — PREDNISONE 20 MG PO TABS
50.0000 mg | ORAL_TABLET | Freq: Four times a day (QID) | ORAL | Status: DC
  Filled 2019-05-26: qty 1

## 2019-05-26 NOTE — Progress Notes (Signed)
PROGRESS NOTE    [name]  LOV:[id number] DOB: November 29, 1940 DOA: 05/25/2019 PCP: [name], MD   Brief Narrative:  Per HPI: [name] is a 78 y.o. 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  She was also trialed on a prednisone taper in the outpatient setting with no relief of her symptoms.  She denies any fevers or chills at home and does not have a significant cough or sputum production.  It appears that she was recently seen by an ENT physician Dr. Redmond Baseman at [facility] for pharyngeal dysphagia and is noted to have recurrent swallowing issues from her recent stroke and brain surgery.  She is recommended to have a modified barium swallow for further evaluation.  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  8/18: Patient was admitted with mild acute on chronic hypoxemic respiratory failure in the setting of emphysema.  There may be components of ongoing silent aspiration with pneumonitis.  CT angiogram ordered and currently pending to rule out PE and to have better imaging.  Pulmonology following.  Procalcitonin low, and therefore antibiotics currently discontinued.  She appears to be doing better overall today.   SLP evaluation pending.  Assessment & Plan:   Active Problems:   Acute hypoxemic respiratory failure (HCC)   Mild acute on chronic hypoxemic respiratory failure in the setting of emphysema -I suspect that this may be related to some ongoing interstitial pneumonitis that is superimposed on emphysema possibly related to her ongoing pharyngeal dysphagia -We will place on dysphagia 3 diet for now and have SLP evaluation with potential modified barium swallow evaluation-this is still pending -Procalcitonin low and no leukocytosis, therefore Rocephin and azithromycin discontinued on 8/18.  Urine strep pneumonia negative and Legionella pending. -D-dimer noted to be elevated at 1.9 and therefore CT angiogram ordered which is currently pending -Consulted pulmonology appreciated for further evaluation -Continue IV methylprednisolone 40 mg twice daily -Place on breathing treatments every 6 hours as needed for shortness of breath or wheezing  History of lung adenocarcinoma with metastasis to the brain status post recent resection -Not currently on any further treatments -Follow-up with neurosurgery as recommended -Continue on Keppra  Peptic ulcer disease -Continue on PPI  Hypothyroidism -Continue Synthroid  Osteoporosis with compression fractures -Continue Prolia outpatient   DVT prophylaxis: Lovenox Code Status: DNI Family Communication: Spoke with ex-husband at bedside on 8/17 Disposition Plan: CT imaging currently pending as well as SLP evaluation.  Pulmonology following and appreciate further recommendations.  Anticipate discharge in the next 24 to 48 hours depending on results.   Consultants:   Pulmonology  Procedures:   As above  Antimicrobials:  Anti-infectives (From admission, onward)   Start     Dose/Rate Route Frequency Ordered Stop   05/25/19 1600  azithromycin (ZITHROMAX) 500 mg in sodium chloride 0.9 % 250 mL IVPB  Status:  Discontinued     500 mg 250 mL/hr  over 60 Minutes Intravenous Every 24 hours 05/25/19 1541 05/26/19 0728   05/25/19 1315  cefTRIAXone (ROCEPHIN) 1 g in sodium chloride 0.9 % 100 mL IVPB     1 g 200 mL/hr over 30 Minutes Intravenous  Once 05/25/19 1310 05/25/19 1406   05/25/19 1315  doxycycline (VIBRA-TABS) tablet 100 mg     100 mg Oral  Once 05/25/19 1310 05/25/19 1328       Subjective: Patient seen and evaluated today with no new acute complaints or concerns. No acute concerns or events noted overnight.  She states that overall she is feeling much better today and does not have any inspirational chest pain this morning.  Objective: Vitals:   05/26/19 0504 05/26/19 0743 05/26/19 1344 05/26/19 1400  BP: 102/68   119/72  Pulse: 83   (!) 108  Resp: 18   18  Temp: 97.6 F (36.4 C)   98 F (36.7 C)  TempSrc: Oral   Axillary  SpO2: 97% 94% 95% 94%  Weight:      Height:        Intake/Output Summary (Last 24 hours) at 05/26/2019 1515 Last data filed at 05/26/2019 0459 Gross per 24 hour  Intake 353 ml  Output 550 ml  Net -197 ml   Filed Weights   05/25/19 1028 05/25/19 1614  Weight: 53.1 kg 51.6 kg    Examination:  General exam: Appears calm and comfortable  Respiratory system: Clear to auscultation. Respiratory effort normal.  On nasal cannula oxygen. Cardiovascular system: S1 & S2 heard, RRR. No JVD, murmurs, rubs, gallops or clicks. No pedal edema. Gastrointestinal system: Abdomen is nondistended, soft and nontender. No organomegaly or masses felt. Normal bowel sounds heard. Central nervous system: Alert and oriented. No focal neurological deficits. Extremities: Symmetric 5 x 5 power. Skin: No rashes, lesions or ulcers Psychiatry: Judgement and insight appear normal. Mood & affect appropriate.     Data Reviewed: I have personally reviewed following labs and imaging studies  CBC: Recent Labs  Lab 05/25/19 1135 05/26/19 0434  WBC 6.1 5.8  NEUTROABS 4.9  --   HGB 12.7 12.2  HCT 40.1 38.2  MCV 90.7  91.2  PLT 380 371   Basic Metabolic Panel: Recent Labs  Lab 05/25/19 1135 05/26/19 0434  NA 134* 133*  K 3.9 3.8  CL 94* 98  CO2 27 23  GLUCOSE 71 108*  BUN 16 18  CREATININE 0.44 0.48  CALCIUM 9.6 9.4   GFR: Estimated Creatinine Clearance: 48 mL/min (by C-G formula based on SCr of 0.48 mg/dL). Liver Function Tests: Recent Labs  Lab 05/25/19 1135  AST 16  ALT 13  ALKPHOS 108  BILITOT 0.8  PROT 7.2  ALBUMIN 4.2   No results for input(s): LIPASE, AMYLASE in the last 168 hours. No results for input(s): AMMONIA in the last 168 hours. Coagulation Profile: No results for input(s): INR, PROTIME in the last 168 hours. Cardiac Enzymes: No results for input(s): CKTOTAL, CKMB, CKMBINDEX, TROPONINI in the last 168 hours. BNP (last 3 results) No results for input(s): PROBNP in the last 8760 hours. HbA1C: No results for input(s): HGBA1C in the last 72 hours. CBG: No results for input(s): GLUCAP in the last 168 hours. Lipid Profile: No results for input(s): CHOL, HDL, LDLCALC, TRIG, CHOLHDL, LDLDIRECT in the last 72 hours. Thyroid Function Tests: No results for input(s): TSH, T4TOTAL, FREET4, T3FREE, THYROIDAB in the last 72 hours. Anemia Panel: No results  for input(s): VITAMINB12, FOLATE, FERRITIN, TIBC, IRON, RETICCTPCT in the last 72 hours. Sepsis Labs: Recent Labs  Lab 05/25/19 1135 05/25/19 1423  PROCALCITON  --  <0.10  LATICACIDVEN 0.8  --     Recent Results (from the past 240 hour(s))  SARS Coronavirus 2 [facility] order, Performed in [facility] hospital lab) Nasopharyngeal Nasopharyngeal Swab     Status: None   Collection Time: 05/25/19 10:43 AM   Specimen: Nasopharyngeal Swab  Result Value Ref Range Status   SARS Coronavirus 2 NEGATIVE NEGATIVE Final    Comment: (NOTE) If result is NEGATIVE SARS-CoV-2 target nucleic acids are NOT DETECTED. The SARS-CoV-2 RNA is generally detectable in upper and lower  respiratory specimens during the acute phase of  infection. The lowest  concentration of SARS-CoV-2 viral copies this assay can detect is 250  copies / mL. A negative result does not preclude SARS-CoV-2 infection  and should not be used as the sole basis for treatment or other  patient management decisions.  A negative result may occur with  improper specimen collection / handling, submission of specimen other  than nasopharyngeal swab, presence of viral mutation(s) within the  areas targeted by this assay, and inadequate number of viral copies  (<250 copies / mL). A negative result must be combined with clinical  observations, patient history, and epidemiological information. If result is POSITIVE SARS-CoV-2 target nucleic acids are DETECTED. The SARS-CoV-2 RNA is generally detectable in upper and lower  respiratory specimens dur ing the acute phase of infection.  Positive  results are indicative of active infection with SARS-CoV-2.  Clinical  correlation with patient history and other diagnostic information is  necessary to determine patient infection status.  Positive results do  not rule out bacterial infection or co-infection with other viruses. If result is PRESUMPTIVE POSTIVE SARS-CoV-2 nucleic acids MAY BE PRESENT.   A presumptive positive result was obtained on the submitted specimen  and confirmed on repeat testing.  While 2019 novel coronavirus  (SARS-CoV-2) nucleic acids may be present in the submitted sample  additional confirmatory testing may be necessary for epidemiological  and / or clinical management purposes  to differentiate between  SARS-CoV-2 and other Sarbecovirus currently known to infect humans.  If clinically indicated additional testing with an alternate test  methodology [id number]) is advised. The SARS-CoV-2 RNA is generally  detectable in upper and lower respiratory sp ecimens during the acute  phase of infection. The expected result is Negative. Fact Sheet for Patients:   StrictlyIdeas.no Fact Sheet for Healthcare Providers: BankingDealers.co.za This test is not yet approved or cleared by the [place] FDA and has been authorized for detection and/or diagnosis of SARS-CoV-2 by FDA under an Emergency Use Authorization (EUA).  This EUA will remain in effect (meaning this test can be used) for the duration of the COVID-19 declaration under Section 564(b)(1) of the Act, 21 U.S.C. section 360bbb-3(b)(1), unless the authorization is terminated or revoked sooner. Performed at [facility], [address], [place], [place] [address]          Radiology Studies: Dg Chest 2 View  Result Date: 05/25/2019 CLINICAL DATA:  Right chest pain with inhalation. The patient has been treated for pneumonia over the past month. EXAM: CHEST - 2 VIEW COMPARISON:  Single-view of the chest 04/19/2019. PA and lateral chest 04/18/2017. CT chest 04/19/2019. FINDINGS: The lungs are emphysematous. Right basilar airspace opacity is identified and slightly increased compared to the most recent plain films. Left lung is clear. Heart size  is upper normal. Aortic atherosclerosis is noted. No pneumothorax or pleural fluid. No bony abnormality. IMPRESSION: Hazy right basilar airspace opacity appears slightly worse on the prior examination and could be due to atelectasis or pneumonia. Emphysema. Electronically Signed   By: Inge Rise M.D.   On: 05/25/2019 11:29        Scheduled Meds: . citalopram  20 mg Oral QHS  . enoxaparin (LOVENOX) injection  40 mg Subcutaneous Q24H  . ferrous sulfate  325 mg Oral Q breakfast  . fluticasone  2 spray Each Nare Daily  . ipratropium-albuterol  3 mL Nebulization Q6H  . levETIRAcetam  500 mg Oral QHS  . levothyroxine  50 mcg Oral QAC breakfast  . methylPREDNISolone (SOLU-MEDROL) injection  40 mg Intravenous Q12H  . multivitamin with minerals  1 tablet Oral Daily  . pantoprazole  40 mg Oral QAC  breakfast  . sodium chloride flush  3 mL Intravenous Q12H  . umeclidinium-vilanterol  1 puff Inhalation Daily   Continuous Infusions: . sodium chloride    . methylPREDNISolone (SOLU-MEDROL) injection       LOS: 0 days    Time spent: 30 minutes    Treniece Holsclaw Darleen Crocker, DO [facility] Pager [phone]  If 7PM-7AM, please contact night-coverage [email or web address] Password [facility] 05/26/2019, 3:15 PM

## 2019-05-26 NOTE — Consult Note (Signed)
Consult requested by: [facility] hospitalist, Dr. Manuella Ghazi Consult requested for: Shortness of breath  HPI: This is a 78 year old who came to the hospital because of increasing shortness of breath.  She says she has been sick for several weeks has had multiple antibiotics.  She started having cough and congestion with coughing up a yellowish sputum.  She got substantially worse yesterday and came to the emergency department.  She has been on antibiotics and steroids.  Her d-dimer is elevated and plans are to do a CT angiogram of her chest.  She says she has had trouble with her breathing for several years.  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  She did not have radiation treatments.  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  She says she feels better but still short of breath.  At baseline she is able to do household work inside.  She is on 2 L nasal cannula at home.  She is not had fever or chills.  She had been seen at [facility] because of dysphagia and it does not look like she has had a modified barium swallow yet.  CT that was done 04/19/2019 has substantial emphysema and pulmonary fibrosis.  This has pattern of UIP.  He denies any chest pain nausea vomiting diarrhea she has had some headache from her brain metastatic disease no urinary symptoms and she does say that she gets choked sometimes when she swallows  Past Medical History:  Diagnosis Date  . Adenocarcinoma of lung ([place]) 12/2010   left lung/surg only  . Allergic rhinitis   . 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 Anxiety disorder   . Aortic aneurysm ([place])   . Brain metastasis ([place])    lung cancer s/p resection 7/20  . Complication of anesthesia 2011   bloodpressure dropped during colonoscopy, not problems since  . Compression fracture   . COPD (chronic obstructive pulmonary disease) ([place])   . Depression   . Diastolic dysfunction    . Diverticulosis   . Dysphagia   . Emphysema lung ([place]) 11/08/2014  . Headache   . Hiatal hernia   . History of kidney stones   . Hypothyroidism   . IBS (irritable bowel syndrome)   . Iron deficiency anemia due to chronic blood loss 12/05/2016  . On home O2 12/15/2013   chronic hypoxia  . Pneumonia   . PUD (peptic ulcer disease)    76yrs  . Shortness of breath    with exertion  . SIADH (syndrome of inappropriate ADH production) ([place])   . Trigeminal neuralgia      Family History  Problem Relation Age of Onset  . Pulmonary embolism Mother   . Heart attack Father   . Hypertension Father   . Liver cancer Sister   . Cancer Sister   . Cancer Brother   . Cancer Daughter   . Colon cancer Neg Hx      Social History   Socioeconomic History  . Marital status: Divorced    Spouse name: Not on file  . Number of children: Not on file  . Years of education: Not on file  . Highest education level: Not on file  Occupational History  . Not on file  Social Needs  . Financial resource strain: Somewhat hard  . Food insecurity    Worry: Never true    Inability: Never true  .  Transportation needs    Medical: No    Non-medical: No  Tobacco Use  . Smoking status: Former Smoker    Packs/day: 0.50    Years: 20.00    Pack years: 10.00    Types: Cigarettes    Quit date: 12/21/2010    Years since quitting: 8.4  . Smokeless tobacco: Never Used  . Tobacco comment: smoking cessation info given and reviewed   Substance and Sexual Activity  . Alcohol use: Yes    Alcohol/week: 0.0 standard drinks    Comment: seldom  . Drug use: No  . Sexual activity: Yes    Birth control/protection: Surgical  Lifestyle  . Physical activity    Days per week: 0 days    Minutes per session: 0 min  . Stress: Not at all  Relationships  . Social Herbalist on phone: Once a week    Gets together: Never    Attends religious service: Never    Active member of club or organization: No    Attends  meetings of clubs or organizations: Never    Relationship status: Divorced  Other Topics Concern  . Not on file  Social History Narrative  . Not on file     ROS: Except as mentioned 10 point review of systems is negative    Objective: Vital signs in last 24 hours: Temp:  [97.6 F (36.4 C)-98.6 F (37 C)] 97.6 F (36.4 C) (08/18 0504) Pulse Rate:  [83-110] 83 (08/18 0504) Resp:  [14-24] 18 (08/18 0504) BP: (102-133)/(60-96) 102/68 (08/18 0504) SpO2:  [82 %-99 %] 94 % (08/18 0743) Weight:  [51.6 kg-53.1 kg] 51.6 kg (08/17 1614) Weight change:  Last BM Date: 05/23/19  Intake/Output from previous day: 08/17 0701 - 08/18 0700 In: 403 [P.O.:100; I.V.:3; IV Piggyback:300] Out: 550 [Urine:550]  PHYSICAL EXAM Constitutional: She is awake and alert and in no acute distress.  Eyes: Pupils react EOMI.  Ears nose mouth and throat: Mucous membranes are moist.  Neck is supple without masses.  Cardiovascular: Her heart is regular with normal heart sounds.  Respiratory: Her respiratory effort is normal and she has some bilateral rhonchi in the bases.  Gastrointestinal: Her abdomen is soft with no masses.  Musculoskeletal: Fairly normal strength bilaterally.  She has significant kyphosis psychiatric: Normal mood and affect.  Skin: Warm and dry  Lab Results: Basic Metabolic Panel: Recent Labs    05/25/19 1135 05/26/19 0434  NA 134* 133*  K 3.9 3.8  CL 94* 98  CO2 27 23  GLUCOSE 71 108*  BUN 16 18  CREATININE 0.44 0.48  CALCIUM 9.6 9.4   Liver Function Tests: Recent Labs    05/25/19 1135  AST 16  ALT 13  ALKPHOS 108  BILITOT 0.8  PROT 7.2  ALBUMIN 4.2   No results for input(s): LIPASE, AMYLASE in the last 72 hours. No results for input(s): AMMONIA in the last 72 hours. CBC: Recent Labs    05/25/19 1135 05/26/19 0434  WBC 6.1 5.8  NEUTROABS 4.9  --   HGB 12.7 12.2  HCT 40.1 38.2  MCV 90.7 91.2  PLT 380 358   Cardiac Enzymes: No results for input(s): CKTOTAL,  CKMB, CKMBINDEX, TROPONINI in the last 72 hours. BNP: No results for input(s): PROBNP in the last 72 hours. D-Dimer: Recent Labs    05/25/19 1423  DDIMER 1.90*   CBG: No results for input(s): GLUCAP in the last 72 hours. Hemoglobin A1C: No results for input(s): HGBA1C  in the last 72 hours. Fasting Lipid Panel: No results for input(s): CHOL, HDL, LDLCALC, TRIG, CHOLHDL, LDLDIRECT in the last 72 hours. Thyroid Function Tests: No results for input(s): TSH, T4TOTAL, FREET4, T3FREE, THYROIDAB in the last 72 hours. Anemia Panel: No results for input(s): VITAMINB12, FOLATE, FERRITIN, TIBC, IRON, RETICCTPCT in the last 72 hours. Coagulation: No results for input(s): LABPROT, INR in the last 72 hours. Urine Drug Screen: Drugs of Abuse     Component Value Date/Time   LABOPIA NONE DETECTED 03/06/2019 0332   COCAINSCRNUR NONE DETECTED 03/06/2019 0332   LABBENZ POSITIVE (A) 03/06/2019 0332   AMPHETMU NONE DETECTED 03/06/2019 0332   THCU NONE DETECTED 03/06/2019 0332   LABBARB NONE DETECTED 03/06/2019 0332    Alcohol Level: No results for input(s): ETH in the last 72 hours. Urinalysis: No results for input(s): COLORURINE, LABSPEC, PHURINE, GLUCOSEU, HGBUR, BILIRUBINUR, KETONESUR, PROTEINUR, UROBILINOGEN, NITRITE, LEUKOCYTESUR in the last 72 hours.  Invalid input(s): APPERANCEUR Misc. Labs:   ABGS: No results for input(s): PHART, PO2ART, TCO2, HCO3 in the last 72 hours.  Invalid input(s): PCO2   MICROBIOLOGY: Recent Results (from the past 240 hour(s))  SARS Coronavirus 2 [facility] order, Performed in [facility] hospital lab) Nasopharyngeal Nasopharyngeal Swab     Status: None   Collection Time: 05/25/19 10:43 AM   Specimen: Nasopharyngeal Swab  Result Value Ref Range Status   SARS Coronavirus 2 NEGATIVE NEGATIVE Final    Comment: (NOTE) If result is NEGATIVE SARS-CoV-2 target nucleic acids are NOT DETECTED. The SARS-CoV-2 RNA is generally detectable in upper and lower   respiratory specimens during the acute phase of infection. The lowest  concentration of SARS-CoV-2 viral copies this assay can detect is 250  copies / mL. A negative result does not preclude SARS-CoV-2 infection  and should not be used as the sole basis for treatment or other  patient management decisions.  A negative result may occur with  improper specimen collection / handling, submission of specimen other  than nasopharyngeal swab, presence of viral mutation(s) within the  areas targeted by this assay, and inadequate number of viral copies  (<250 copies / mL). A negative result must be combined with clinical  observations, patient history, and epidemiological information. If result is POSITIVE SARS-CoV-2 target nucleic acids are DETECTED. The SARS-CoV-2 RNA is generally detectable in upper and lower  respiratory specimens dur ing the acute phase of infection.  Positive  results are indicative of active infection with SARS-CoV-2.  Clinical  correlation with patient history and other diagnostic information is  necessary to determine patient infection status.  Positive results do  not rule out bacterial infection or co-infection with other viruses. If result is PRESUMPTIVE POSTIVE SARS-CoV-2 nucleic acids MAY BE PRESENT.   A presumptive positive result was obtained on the submitted specimen  and confirmed on repeat testing.  While 2019 novel coronavirus  (SARS-CoV-2) nucleic acids may be present in the submitted sample  additional confirmatory testing may be necessary for epidemiological  and / or clinical management purposes  to differentiate between  SARS-CoV-2 and other Sarbecovirus currently known to infect humans.  If clinically indicated additional testing with an alternate test  methodology [phone]) is advised. The SARS-CoV-2 RNA is generally  detectable in upper and lower respiratory sp ecimens during the acute  phase of infection. The expected result is Negative. Fact  Sheet for Patients:  StrictlyIdeas.no Fact Sheet for Healthcare Providers: BankingDealers.co.za This test is not yet approved or cleared by the [place] FDA and  has been authorized for detection and/or diagnosis of SARS-CoV-2 by FDA under an Emergency Use Authorization (EUA).  This EUA will remain in effect (meaning this test can be used) for the duration of the COVID-19 declaration under Section 564(b)(1) of the Act, 21 U.S.C. section 360bbb-3(b)(1), unless the authorization is terminated or revoked sooner. Performed at [facility], [address], [place], [place] [address]     Studies/Results: Dg Chest 2 View  Result Date: 05/25/2019 CLINICAL DATA:  Right chest pain with inhalation. The patient has been treated for pneumonia over the past month. EXAM: CHEST - 2 VIEW COMPARISON:  Single-view of the chest 04/19/2019. PA and lateral chest 04/18/2017. CT chest 04/19/2019. FINDINGS: The lungs are emphysematous. Right basilar airspace opacity is identified and slightly increased compared to the most recent plain films. Left lung is clear. Heart size is upper normal. Aortic atherosclerosis is noted. No pneumothorax or pleural fluid. No bony abnormality. IMPRESSION: Hazy right basilar airspace opacity appears slightly worse on the prior examination and could be due to atelectasis or pneumonia. Emphysema. Electronically Signed   By: Inge Rise M.D.   On: 05/25/2019 11:29    Medications:  Prior to Admission:  Medications Prior to Admission  Medication Sig Dispense Refill Last Dose  . acetaminophen (TYLENOL) 325 MG tablet Take 650 mg by mouth every 6 (six) hours as needed for moderate pain or headache.      . albuterol (VENTOLIN HFA) 108 (90 Base) MCG/ACT inhaler Inhale 2 puffs into the lungs every 6 (six) hours as needed for wheezing or shortness of breath. 18 g 2   . ALPRAZolam (XANAX) 0.25 MG tablet Take 1 tablet (0.25 mg total) by mouth 2  (two) times daily as needed for anxiety. 30 tablet 0   . ALPRAZolam (XANAX) 0.5 MG tablet Take 1 tablet by mouth 2 (two) times daily as needed.     Marland Kitchen amoxicillin-clavulanate (AUGMENTIN) 875-125 MG tablet Take 1 tablet by mouth 2 (two) times daily. Please deliver 20 tablet 0   . citalopram (CELEXA) 40 MG tablet Take 0.5 tablets (20 mg total) by mouth at bedtime.     Marland Kitchen denosumab (PROLIA) 60 MG/ML SOSY injection Inject 60 mg into the skin every 6 (six) months. 1 mL 0   . ferrous sulfate 325 (65 FE) MG tablet Take 325 mg by mouth daily with breakfast.     . fluticasone (FLONASE) 50 MCG/ACT nasal spray Place 2 sprays into both nostrils daily. 16 g 6   . levETIRAcetam (KEPPRA) 500 MG tablet Take 1 tablet (500 mg total) by mouth 2 (two) times daily. (Patient taking differently: Take 500 mg by mouth at bedtime. ) 60 tablet 2   . levothyroxine (SYNTHROID) 50 MCG tablet Take 1 tablet (50 mcg total) by mouth daily before breakfast.     . Multiple Vitamin (MULTIVITAMIN WITH MINERALS) TABS tablet Take 1 tablet by mouth daily.     . ondansetron (ZOFRAN ODT) 4 MG disintegrating tablet Take 1 tablet (4 mg total) by mouth every 4 (four) hours as needed for nausea or vomiting.     . OXYGEN Inhale 2 L into the lungs continuous.     . pantoprazole (PROTONIX) 40 MG tablet Take 1 tablet (40 mg total) by mouth daily before breakfast. 90 tablet 3   . senna-docusate (SENOKOT-S) 8.6-50 MG tablet Take 1 tablet by mouth at bedtime as needed for mild constipation.     Marland Kitchen umeclidinium-vilanterol (ANORO ELLIPTA) 62.5-25 MCG/INH AEPB Inhale 1 puff into the  lungs daily. 90 each 3    Scheduled: . citalopram  20 mg Oral QHS  . diphenhydrAMINE  50 mg Intravenous Once  . enoxaparin (LOVENOX) injection  40 mg Subcutaneous Q24H  . ferrous sulfate  325 mg Oral Q breakfast  . fluticasone  2 spray Each Nare Daily  . ipratropium-albuterol  3 mL Nebulization Q6H  . levETIRAcetam  500 mg Oral QHS  . levothyroxine  50 mcg Oral QAC  breakfast  . methylPREDNISolone (SOLU-MEDROL) injection  40 mg Intravenous Q12H  . multivitamin with minerals  1 tablet Oral Daily  . pantoprazole  40 mg Oral QAC breakfast  . sodium chloride flush  3 mL Intravenous Q12H  . umeclidinium-vilanterol  1 puff Inhalation Daily   Continuous: . sodium chloride     KDP:TELMRA chloride, acetaminophen **OR** acetaminophen, acetaminophen, ALPRAZolam, ondansetron (ZOFRAN) IV, senna-docusate, sodium chloride flush  Assesment: She was admitted with acute on chronic hypoxic respiratory failure.  Chest x-ray is indeterminate but could be pneumonia.  It is in the right lower lobe so aspiration would certainly be a possibility.  Additionally she has significant emphysema by CT and has pulmonary fibrosis.  She has personal history of lung cancer and had metastatic disease to brain.  I do not see definite pulmonary cancer on her CT from last month.  She has dysphagia and says she gets choked on food so I think speech evaluation and modified barium swallow as planned are appropriate  Because what I do not think we can tell for sure from her chest x-ray I agree with plans to do another CT angiogram.  Her d-dimer is elevated. Active Problems:   Acute hypoxemic respiratory failure (HCC)    Plan: Continue treatments.  Check CT angiogram.  She says she is improved with what she is been on.  Speech evaluation and probable modified barium swallow.  Depending on results of CT angiogram if this does look like aspiration pneumonia of course that will need to be treated.  We may get a better look at whether she has something like a lung cancer now as well.    LOS: 0 days   Alonza Bogus 05/26/2019, 8:24 AM

## 2019-05-26 NOTE — Evaluation (Signed)
Clinical/Bedside Swallow Evaluation Patient Details  Name: [name] MRN: [id number] Date of Birth: 1941/07/07  Today's Date: 05/26/2019 Time: SLP Start Time (ACUTE ONLY): 3382 SLP Stop Time (ACUTE ONLY): 1556 SLP Time Calculation (min) (ACUTE ONLY): 23 min  Past Medical History:  Past Medical History:  Diagnosis Date  . Adenocarcinoma of lung ([place]) 12/2010   left lung/surg only  . Allergic rhinitis   . 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 Anxiety disorder   . Aortic aneurysm ([place])   . Brain metastasis ([place])    lung cancer s/p resection 7/20  . Complication of anesthesia 2011   bloodpressure dropped during colonoscopy, not problems since  . Compression fracture   . COPD (chronic obstructive pulmonary disease) ([place])   . Depression   . Diastolic dysfunction   . Diverticulosis   . Dysphagia   . Emphysema lung ([place]) 11/08/2014  . Headache   . Hiatal hernia   . History of kidney stones   . Hypothyroidism   . IBS (irritable bowel syndrome)   . Iron deficiency anemia due to chronic blood loss 12/05/2016  . On home O2 12/15/2013   chronic hypoxia  . Pneumonia   . PUD (peptic ulcer disease)    12yrs  . Shortness of breath    with exertion  . SIADH (syndrome of inappropriate ADH production) ([place])   . Trigeminal neuralgia    Past Surgical History:  Past Surgical History:  Procedure Laterality Date  . ABDOMINAL HYSTERECTOMY    . APPLICATION OF CRANIAL NAVIGATION N/A 03/18/2019   Procedure: APPLICATION OF CRANIAL NAVIGATION;  Surgeon: Consuella Lose, MD;  Location: [facility];  Service: Neurosurgery;  Laterality: N/A;  . BACK SURGERY  January 13, 2014  . CATARACT EXTRACTION    . CATARACT EXTRACTION W/PHACO  09/02/2012   Procedure: CATARACT EXTRACTION PHACO AND INTRAOCULAR LENS PLACEMENT (IOC);  Surgeon: Elta Guadeloupe T. Gershon Crane, MD;  Location: [facility];  Service: Ophthalmology;  Laterality: Left;  CDE:10.35  . CHOLECYSTECTOMY    . COLONOSCOPY  2011    hyperplastic polyp, 3-4 small cecal AVMs, nonbleeding  . COLONOSCOPY N/A 11/23/2014   Dr. Oneida Alar: moderate diverticulosis, hemorrhoids, redundant colon. next TCS in 10-15 years.   . CRANIOTOMY Left 03/18/2019   Procedure: Left stereotactic craniotomy for tumor resection;  Surgeon: Consuella Lose, MD;  Location: [facility];  Service: Neurosurgery;  Laterality: Left;  Left stereotactic craniotomy for tumor resection  . ENDARTERECTOMY Right 08/21/2013   Procedure: RIGHT CAROTID ANEURYSM RESECTION;  Surgeon: Rosetta Posner, MD;  Location: [facility];  Service: Vascular;  Laterality: Right;  . ESOPHAGEAL DILATION  02/24/2018   Procedure: ESOPHAGEAL DILATION;  Surgeon: Danie Binder, MD;  [facility];  Service: Endoscopy;;  . ESOPHAGOGASTRODUODENOSCOPY  11/13/2009   w/dilation to 22mm, gastric ulceration (H.Pylori) s/p treatment  . ESOPHAGOGASTRODUODENOSCOPY  11/21/2009   distal esophageal web, gastritis  . ESOPHAGOGASTRODUODENOSCOPY  03/14/12   NKN:LZJQBHALP in the distal esophagus/Mild gastritis/small HH. + H.pylori, prescribed Pylera. Finished treatment.   . ESOPHAGOGASTRODUODENOSCOPY N/A 12/13/2017   Procedure: ESOPHAGOGASTRODUODENOSCOPY (EGD);  Surgeon: Danie Binder, MD;  [facility];  Service: Endoscopy;  Laterality: N/A;  8:30am  . ESOPHAGOGASTRODUODENOSCOPY N/A 02/24/2018   Procedure: ESOPHAGOGASTRODUODENOSCOPY (EGD);  Surgeon: Danie Binder, MD;  [facility];  Service: Endoscopy;  Laterality: N/A;  11:00am  . fatty tumor removal from lt groin    . FRACTURE SURGERY Left    hand and left  ring finger  . GIVENS CAPSULE STUDY N/A 12/26/2017   Procedure: GIVENS CAPSULE STUDY;  Surgeon: Danie Binder, MD;  [facility];  Service: Endoscopy;  Laterality: N/A;  7:30am  . GIVENS CAPSULE STUDY N/A 02/24/2018   Procedure: GIVENS CAPSULE STUDY;  Surgeon: Danie Binder, MD;  [facility];  Service: Endoscopy;  Laterality: N/A;  . KNEE SURGERY      patella tendon repair june 2019 [facility]  . LUNG CANCER SURGERY  12/2010   Left VATS, minithoracotomy, LLL superior segmentectomy  . ORIF PATELLA Right 03/18/2018   Procedure: OPEN REDUCTION INTERNAL (ORIF) FIXATION RIGHT PATELLA;  Surgeon: Carole Civil, MD;  Location: [facility];  Service: Orthopedics;  Laterality: Right;  . ORIF WRIST FRACTURE Left 04/09/2014   Procedure: OPEN REDUCTION INTERNAL FIXATION (ORIF) WRIST FRACTURE;  Surgeon: Renette Butters, MD;  Location: [facility];  Service: Orthopedics;  Laterality: Left;  . PERCUTANEOUS PINNING Left 04/09/2014   Procedure: PERCUTANEOUS PINNING EXTREMITY;  Surgeon: Renette Butters, MD;  Location: [facility];  Service: Orthopedics;  Laterality: Left;  . SAVORY DILATION N/A 12/13/2017   Procedure: SAVORY DILATION;  Surgeon: Danie Binder, MD;  [facility];  Service: Endoscopy;  Laterality: N/A;  . TUBAL LIGATION    . vocal cord surgery  02/06/2011   laryngoscopy with bilateral vocal cord Radiesse injection for vocal cord paralysis  . YAG LASER APPLICATION Left 32/44/0102   Procedure: YAG LASER APPLICATION;  Surgeon: Rutherford Guys, MD;  Location: [facility];  Service: Ophthalmology;  Laterality: Left;   HPI:  [name] a 78 y.o.femalewith medical history significant foremphysema with chronic hypoxemia on 2 L nasal cannula at home,prior tobacco abuse, lung adenocarcinoma with brain metastasis status post resection on 7/20, hypothyroidism, peptic ulcer disease, depression,and osteoporosis with prior lumbar compression fractures who presented at the urging of her primary care physician after 3 to 4 weeks of outpatient antibiotic trials for community-acquired pneumonia, but with ongoing symptomatology of shortness of breath with tachycardia and hypoxemia particularly with ambulation. She was also trialed on a prednisone taper in the outpatient setting with no relief of her symptoms. She denies any fevers or chills at home and does not have a  significant cough or sputum production. It appears that she was recently seen by an ENT physician Dr. Redmond Baseman at [facility] for pharyngeal dysphagia and is noted to have recurrent swallowing issues from her recent stroke and brain surgery. She is recommended to have a modified barium swallow for further evaluation. 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 Patient was admitted with mild acute on chronic hypoxemic respiratory failure in the setting of emphysema.  There may be components of ongoing silent aspiration with pneumonitis.  CT angiogram ordered and currently pending to rule out PE and to have better imaging.  Pulmonology following.  Procalcitonin low, and therefore antibiotics currently discontinued.  She appears to be doing better overall today. BSE ordered.   Assessment / Plan / Recommendation Clinical Impression  Clinical swallow evaluation completed at bedside. Pt reports that she has noticed difficulty swallowing since her stroke. She recently saw Dr. Redmond Baseman (ENT), who recommended MBSS, however it does not appear that it was ever arranged. Pt without overt signs and symptoms  of aspiration at bedside currently, however given PNA, recent stroke, CA, and Pt with reports of dysphagia, will proceed with MBSS tomorrow AM. Continue diet as ordered for now. Pt in agreement with plan of care.   SLP Visit Diagnosis: Dysphagia, unspecified (R13.10)    Aspiration Risk  Mild aspiration risk    Diet Recommendation Dysphagia 3 (Mech soft);Thin liquid   Liquid Administration via: Cup;Straw Medication Administration: Whole meds with liquid Supervision: Patient able to self feed Compensations: Slow rate;Small sips/bites Postural Changes:  Seated upright at 90 degrees;Remain upright for at least 30 minutes after po intake    Other  Recommendations Oral Care Recommendations: Oral care BID Other Recommendations: Clarify dietary restrictions   Follow up Recommendations None      Frequency and Duration min 2x/week  1 week       Prognosis Prognosis for Safe Diet Advancement: Good      Swallow Study   General Date of Onset: 05/25/19 HPI: LUVERTA KORTE a 78 y.o.femalewith medical history significant foremphysema with chronic hypoxemia on 2 L nasal cannula at home,prior tobacco abuse, lung adenocarcinoma with brain metastasis status post resection on 7/20, hypothyroidism, peptic ulcer disease, depression,and osteoporosis with prior lumbar compression fractures who presented at the urging of her primary care physician after 3 to 4 weeks of outpatient antibiotic trials for community-acquired pneumonia, but with ongoing symptomatology of shortness of breath with tachycardia and hypoxemia particularly with ambulation. She was also trialed on a prednisone taper in the outpatient setting with no relief of her symptoms. She denies any fevers or chills at home and does not have a significant cough or sputum production. It appears that she was recently seen by an ENT physician Dr. Redmond Baseman at [facility] for pharyngeal dysphagia and is noted to have recurrent swallowing issues from her recent stroke and brain surgery. She is recommended to have a modified barium swallow for further evaluation. 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 Patient was admitted with mild acute on chronic hypoxemic respiratory failure in the setting of emphysema.  There may be components of ongoing  silent aspiration with pneumonitis.  CT angiogram ordered and currently pending to rule out PE and to have better imaging.  Pulmonology following.  Procalcitonin low, and therefore antibiotics currently discontinued.  She appears to be doing better overall today. BSE ordered. Type of Study: Bedside Swallow Evaluation Previous Swallow Assessment: none on record; SLP saw for SLE only Diet Prior to this Study: Dysphagia 3 (soft);Thin liquids Temperature Spikes Noted: No Respiratory Status: Nasal cannula History of Recent Intubation: No Behavior/Cognition: Alert;Cooperative;Pleasant mood Oral Cavity Assessment: Within Functional Limits Oral Care Completed by SLP: Yes Oral Cavity - Dentition: Adequate natural dentition Vision: Functional for self-feeding Self-Feeding Abilities: Able to feed self Patient Positioning: Upright in bed Baseline Vocal Quality: Normal Volitional Cough: Strong Volitional Swallow: Able to elicit    Oral/Motor/Sensory Function Overall Oral Motor/Sensory Function: Within functional limits   Ice Chips Ice chips: Within functional limits Presentation: Spoon   Thin Liquid Thin Liquid: Within functional limits Presentation: Cup;Self Fed;Straw    Nectar Thick Nectar Thick Liquid: Not tested   Honey Thick Honey Thick Liquid: Not tested   Puree Puree: Not tested   Solid     Solid: Within functional limits  Presentation: Self Fed     Thank you,  Genene Churn, [place]  Jhair Witherington 05/26/2019,4:02 PM

## 2019-05-26 NOTE — Progress Notes (Signed)
SLP Cancellation Note  Patient Details Name: MAKILA COLOMBE MRN: [id number] DOB: 02/06/1941   Cancelled treatment:       Reason Eval/Treat Not Completed: Patient at procedure or test/unavailable; Upon SLP arrival, Pt was being escorted to CT. SLP will check back later as schedule permits.   Thank you,  Genene Churn, [place]    [place] 05/26/2019, 2:52 PM

## 2019-05-26 NOTE — Care Management Obs Status (Signed)
[facility] NOTIFICATION   Patient Details  Name: [name] MRN: [id number] Date of Birth: 1941/03/31   Medicare Observation Status Notification Given:  Yes    Boneta Lucks, RN 05/26/2019, 3:06 PM

## 2019-05-27 ENCOUNTER — Inpatient Hospital Stay (HOSPITAL_COMMUNITY): Payer: PPO

## 2019-05-27 LAB — BASIC METABOLIC PANEL
Anion gap: 11 (ref 5–15)
BUN: 14 mg/dL (ref 8–23)
CO2: 26 mmol/L (ref 22–32)
Calcium: 9.7 mg/dL (ref 8.9–10.3)
Chloride: 97 mmol/L — ABNORMAL LOW (ref 98–111)
Creatinine, Ser: 0.4 mg/dL — ABNORMAL LOW (ref 0.44–1.00)
GFR calc Af Amer: >60 mL/min (ref >60)
GFR calc non Af Amer: >60 mL/min (ref >60)
Glucose, Bld: 111 mg/dL — ABNORMAL HIGH (ref 70–99)
Potassium: 4 mmol/L (ref 3.5–5.1)
Sodium: 134 mmol/L — ABNORMAL LOW (ref 135–145)

## 2019-05-27 MED ORDER — PANTOPRAZOLE SODIUM 40 MG PO TBEC
40.0000 mg | DELAYED_RELEASE_TABLET | Freq: Two times a day (BID) | ORAL | Status: DC
  Administered 2019-05-27 – 2019-05-28 (×2): 40 mg via ORAL
  Filled 2019-05-27 (×3): qty 1

## 2019-05-27 MED ORDER — IPRATROPIUM-ALBUTEROL 0.5-2.5 (3) MG/3ML IN SOLN
3.0000 mL | Freq: Three times a day (TID) | RESPIRATORY_TRACT | Status: DC
  Administered 2019-05-27 – 2019-05-28 (×4): 3 mL via RESPIRATORY_TRACT
  Filled 2019-05-27 (×4): qty 3

## 2019-05-27 MED ORDER — POLYETHYLENE GLYCOL 3350 17 G PO PACK
17.0000 g | PACK | Freq: Every day | ORAL | Status: DC
  Administered 2019-05-27 – 2019-05-28 (×2): 17 g via ORAL
  Filled 2019-05-27 (×2): qty 1

## 2019-05-27 MED ORDER — BISACODYL 5 MG PO TBEC
5.0000 mg | DELAYED_RELEASE_TABLET | Freq: Every day | ORAL | Status: DC | PRN
  Administered 2019-05-27: 09:00:00 5 mg via ORAL
  Filled 2019-05-27: qty 1

## 2019-05-27 MED ORDER — METHYLPREDNISOLONE SODIUM SUCC 40 MG IJ SOLR
40.0000 mg | Freq: Every day | INTRAMUSCULAR | Status: DC
  Administered 2019-05-28: 09:00:00 40 mg via INTRAVENOUS
  Filled 2019-05-27: qty 1

## 2019-05-27 NOTE — Progress Notes (Signed)
Modified Barium Swallow Progress Note  Patient Details  Name: [name] MRN: [id number] Date of Birth: 11-Sep-1941  Today's Date: 05/27/2019  Modified Barium Swallow completed.  Full report located under Chart Review in the Imaging Section.  Brief recommendations include the following:  Clinical Impression  Pt presents with normal oropharyngeal swallow with timely for age swallow initiation, trace flash penetration of thins (underepiglottic coating only), and no significant residuals post swallow during presentations of thin via tsp, cup, straw, puree, regular textures, and pill. Pt noted to have a prominent cricopharyngeus that did not appear to negatively impact swallow function, however if Pt reports dysphagia, consider esophageal assessment. Recommend regular textures and thin liquids with standard aspiration and reflux precautions. No further SLP services indicated at this time.   Swallow Evaluation Recommendations   Recommended Consults: Consider esophageal assessment(Pt noted to have prominent cricopharyngeus muscle)   SLP Diet Recommendations: Regular solids;Thin liquid   Liquid Administration via: Cup;Straw   Medication Administration: Whole meds with liquid   Supervision: Patient able to self feed   Compensations: Slow rate;Small sips/bites   Postural Changes: Remain semi-upright after after feeds/meals (Comment);Seated upright at 90 degrees   Oral Care Recommendations: Oral care BID   Other Recommendations: Clarify dietary restrictions   Thank you,  Genene Churn, [place]  [name] 05/27/2019,12:45 PM

## 2019-05-27 NOTE — Progress Notes (Signed)
Subjective: She says she feels okay but she is having a lot of GI symptoms.  She is complaining of some abdominal discomfort, nausea and constipation.  She describes what she uses for constipation and I think it is Dulcolax and I have taken the liberty of ordering that.  She had CT angiogram done yesterday and does not show any evidence of pulmonary emboli and no evidence of recurrence of her cancer and no pneumonia.  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  She says she is having trouble swallowing and she is scheduled for modified barium swallow today.  Objective: Vital signs in last 24 hours: Temp:  [98 F (36.7 C)-98.6 F (37 C)] 98.6 F (37 C) (08/19 0625) Pulse Rate:  [76-108] 76 (08/19 0625) Resp:  [18-20] 20 (08/19 0625) BP: (109-119)/(72-76) 117/76 (08/19 0625) SpO2:  [93 %-97 %] 97 % (08/19 0736) Weight change:  Last BM Date: 05/23/19  Intake/Output from previous day: 08/18 0701 - 08/19 0700 In: -  Out: 350 [Urine:350]  PHYSICAL EXAM General appearance: alert, cooperative and no distress Resp: clear to auscultation bilaterally Cardio: regular rate and rhythm, S1, S2 normal, no murmur, click, rub or gallop GI: soft, non-tender; bowel sounds normal; no masses,  no organomegaly Extremities: extremities normal, atraumatic, no cyanosis or edema  Lab Results:  Results for orders placed or performed during the hospital encounter of 05/25/19 (from the past 48 hour(s))  SARS Coronavirus 2 [facility] order, Performed in [facility] hospital lab) Nasopharyngeal Nasopharyngeal Swab     Status: None   Collection Time: 05/25/19 10:43 AM   Specimen: Nasopharyngeal Swab  Result Value Ref Range   SARS Coronavirus 2 NEGATIVE NEGATIVE    Comment: (NOTE) If result is NEGATIVE SARS-CoV-2 target nucleic acids are NOT DETECTED. The SARS-CoV-2 RNA is generally detectable in upper and lower  respiratory  specimens during the acute phase of infection. The lowest  concentration of SARS-CoV-2 viral copies this assay can detect is 250  copies / mL. A negative result does not preclude SARS-CoV-2 infection  and should not be used as the sole basis for treatment or other  patient management decisions.  A negative result may occur with  improper specimen collection / handling, submission of specimen other  than nasopharyngeal swab, presence of viral mutation(s) within the  areas targeted by this assay, and inadequate number of viral copies  (<250 copies / mL). A negative result must be combined with clinical  observations, patient history, and epidemiological information. If result is POSITIVE SARS-CoV-2 target nucleic acids are DETECTED. The SARS-CoV-2 RNA is generally detectable in upper and lower  respiratory specimens dur ing the acute phase of infection.  Positive  results are indicative of active infection with SARS-CoV-2.  Clinical  correlation with patient history and other diagnostic information is  necessary to determine patient infection status.  Positive results do  not rule out bacterial infection or co-infection with other viruses. If result is PRESUMPTIVE POSTIVE SARS-CoV-2 nucleic acids MAY BE PRESENT.   A presumptive positive result was obtained on the submitted specimen  and confirmed on repeat testing.  While 2019 novel coronavirus  (SARS-CoV-2) nucleic acids may be present in the submitted sample  additional confirmatory testing may be necessary for epidemiological  and / or clinical management purposes  to differentiate between  SARS-CoV-2 and other Sarbecovirus currently known to infect humans.  If clinically indicated additional testing with an alternate test  methodology [phone]) is advised. The SARS-CoV-2 RNA is generally  detectable in upper and lower respiratory sp ecimens during the acute  phase of infection. The expected result is Negative. Fact Sheet for  Patients:  StrictlyIdeas.no Fact Sheet for Healthcare Providers: BankingDealers.co.za This test is not yet approved or cleared by the [place] FDA and has been authorized for detection and/or diagnosis of SARS-CoV-2 by FDA under an Emergency Use Authorization (EUA).  This EUA will remain in effect (meaning this test can be used) for the duration of the COVID-19 declaration under Section 564(b)(1) of the Act, 21 U.S.C. section 360bbb-3(b)(1), unless the authorization is terminated or revoked sooner. Performed at [facility], [address] [place], [place] [address]   CBC with Differential/Platelet     Status: None   Collection Time: 05/25/19 11:35 AM  Result Value Ref Range   WBC 6.1 4.0 - 10.5 K/uL   RBC 4.42 3.87 - 5.11 MIL/uL   Hemoglobin 12.7 12.0 - 15.0 g/dL   HCT 40.1 36.0 - 46.0 %   MCV 90.7 80.0 - 100.0 fL   MCH 28.7 26.0 - 34.0 pg   MCHC 31.7 30.0 - 36.0 g/dL   RDW 12.6 11.5 - 15.5 %   Platelets 380 150 - 400 K/uL   nRBC 0.0 0.0 - 0.2 %   Neutrophils Relative % 79 %   Neutro Abs 4.9 1.7 - 7.7 K/uL   Lymphocytes Relative 11 %   Lymphs Abs 0.7 0.7 - 4.0 K/uL   Monocytes Relative 7 %   Monocytes Absolute 0.4 0.1 - 1.0 K/uL   Eosinophils Relative 2 %   Eosinophils Absolute 0.1 0.0 - 0.5 K/uL   Basophils Relative 1 %   Basophils Absolute 0.0 0.0 - 0.1 K/uL   Immature Granulocytes 0 %   Abs Immature Granulocytes 0.02 0.00 - 0.07 K/uL    Comment: Performed at [facility], [address], [place], [place] [address]  Comprehensive metabolic panel     Status: Abnormal   Collection Time: 05/25/19 11:35 AM  Result Value Ref Range   Sodium 134 (L) 135 - 145 mmol/L   Potassium 3.9 3.5 - 5.1 mmol/L   Chloride 94 (L) 98 - 111 mmol/L   CO2 27 22 - 32 mmol/L   Glucose, Bld 71 70 - 99 mg/dL   BUN 16 8 - 23 mg/dL   Creatinine, Ser 0.44 0.44 - 1.00 mg/dL   Calcium 9.6 8.9 - 10.3 mg/dL   Total Protein 7.2 6.5 - 8.1 g/dL    Albumin 4.2 3.5 - 5.0 g/dL   AST 16 15 - 41 U/L   ALT 13 0 - 44 U/L   Alkaline Phosphatase 108 38 - 126 U/L   Total Bilirubin 0.8 0.3 - 1.2 mg/dL   GFR calc non Af Amer >60 >60 mL/min   GFR calc Af Amer >60 >60 mL/min   Anion gap 13 5 - 15    Comment: Performed at [facility], [address], [place], [place] [address]  Brain natriuretic peptide     Status: None   Collection Time: 05/25/19 11:35 AM  Result Value Ref Range   B Natriuretic Peptide 46.0 0.0 - 100.0 pg/mL    Comment: Performed at [facility], [address] [place], [place] [address]  Troponin I (High Sensitivity)     Status: None   Collection Time: 05/25/19 11:35 AM  Result Value Ref Range   Troponin I (High Sensitivity) 5 <18 ng/L    Comment: (  NOTE) Elevated high sensitivity troponin I (hsTnI) values and significant  changes across serial measurements may suggest ACS but many other  chronic and acute conditions are known to elevate hsTnI results.  Refer to the "Links" section for chest pain algorithms and additional  guidance. Performed at [facility], [address], [place], [place] [address]   Lactic acid, plasma     Status: None   Collection Time: 05/25/19 11:35 AM  Result Value Ref Range   Lactic Acid, Venous 0.8 0.5 - 1.9 mmol/L    Comment: Performed at [facility], [address] [place], [place] [address]  Troponin I (High Sensitivity)     Status: None   Collection Time: 05/25/19  2:23 PM  Result Value Ref Range   Troponin I (High Sensitivity) 6 <18 ng/L    Comment: (NOTE) Elevated high sensitivity troponin I (hsTnI) values and significant  changes across serial measurements may suggest ACS but many other  chronic and acute conditions are known to elevate hsTnI results.  Refer to the "Links" section for chest pain algorithms and additional  guidance. Performed at [facility], [address] [place], [place] [address]   D-dimer, quantitative (not at [facility])     Status: Abnormal   Collection Time:  05/25/19  2:23 PM  Result Value Ref Range   D-Dimer, Quant 1.90 (H) 0.00 - 0.50 ug/mL-FEU    Comment: (NOTE) At the manufacturer cut-off of 0.50 ug/mL FEU, this assay has been documented to exclude PE with a sensitivity and negative predictive value of 97 to 99%.  At this time, this assay has not been approved by the FDA to exclude DVT/VTE. Results should be correlated with clinical presentation. Performed at [facility], [address], [place], [place] [address]   Procalcitonin - Baseline     Status: None   Collection Time: 05/25/19  2:23 PM  Result Value Ref Range   Procalcitonin <0.10 ng/mL    Comment:        Interpretation: PCT (Procalcitonin) <= 0.5 ng/mL: Systemic infection (sepsis) is not likely. Local bacterial infection is possible. (NOTE)       Sepsis PCT Algorithm           Lower Respiratory Tract                                      Infection PCT Algorithm    ----------------------------     ----------------------------         PCT < 0.25 ng/mL                PCT < 0.10 ng/mL         Strongly encourage             Strongly discourage   discontinuation of antibiotics    initiation of antibiotics    ----------------------------     -----------------------------       PCT 0.25 - 0.50 ng/mL            PCT 0.10 - 0.25 ng/mL               OR       >80% decrease in PCT            Discourage initiation of  antibiotics      Encourage discontinuation           of antibiotics    ----------------------------     -----------------------------         PCT >= 0.50 ng/mL              PCT 0.26 - 0.50 ng/mL               AND        <80% decrease in PCT             Encourage initiation of                                             antibiotics       Encourage continuation           of antibiotics    ----------------------------     -----------------------------        PCT >= 0.50 ng/mL                  PCT > 0.50 ng/mL                AND         increase in PCT                  Strongly encourage                                      initiation of antibiotics    Strongly encourage escalation           of antibiotics                                     -----------------------------                                           PCT <= 0.25 ng/mL                                                 OR                                        > 80% decrease in PCT                                     Discontinue / Do not initiate                                             antibiotics Performed at [facility], [address], [place], [place] [address]   Strep pneumoniae urinary antigen     Status: None   Collection Time: 05/25/19  9:36 PM  Result Value Ref Range   Strep Pneumo Urinary Antigen NEGATIVE NEGATIVE    Comment:        Infection due to S. pneumoniae cannot be absolutely ruled out since the antigen present may be below the detection limit of the test. Performed at [facility], [address] [place], [place] [address]   Basic metabolic panel     Status: Abnormal   Collection Time: 05/26/19  4:34 AM  Result Value Ref Range   Sodium 133 (L) 135 - 145 mmol/L   Potassium 3.8 3.5 - 5.1 mmol/L   Chloride 98 98 - 111 mmol/L   CO2 23 22 - 32 mmol/L   Glucose, Bld 108 (H) 70 - 99 mg/dL   BUN 18 8 - 23 mg/dL   Creatinine, Ser 0.48 0.44 - 1.00 mg/dL   Calcium 9.4 8.9 - 10.3 mg/dL   GFR calc non Af Amer >60 >60 mL/min   GFR calc Af Amer >60 >60 mL/min   Anion gap 12 5 - 15    Comment: Performed at [facility], [address] [place], [place] [address]  CBC     Status: None   Collection Time: 05/26/19  4:34 AM  Result Value Ref Range   WBC 5.8 4.0 - 10.5 K/uL   RBC 4.19 3.87 - 5.11 MIL/uL   Hemoglobin 12.2 12.0 - 15.0 g/dL   HCT 38.2 36.0 - 46.0 %   MCV 91.2 80.0 - 100.0 fL   MCH 29.1 26.0 - 34.0 pg   MCHC 31.9 30.0 - 36.0 g/dL   RDW 12.4 11.5 - 15.5 %   Platelets 358 150 - 400 K/uL   nRBC 0.0 0.0 - 0.2 %     Comment: Performed at [facility], [address], [place], [place] [address]  Basic metabolic panel     Status: Abnormal   Collection Time: 05/27/19  5:09 AM  Result Value Ref Range   Sodium 134 (L) 135 - 145 mmol/L   Potassium 4.0 3.5 - 5.1 mmol/L   Chloride 97 (L) 98 - 111 mmol/L   CO2 26 22 - 32 mmol/L   Glucose, Bld 111 (H) 70 - 99 mg/dL   BUN 14 8 - 23 mg/dL   Creatinine, Ser 0.40 (L) 0.44 - 1.00 mg/dL   Calcium 9.7 8.9 - 10.3 mg/dL   GFR calc non Af Amer >60 >60 mL/min   GFR calc Af Amer >60 >60 mL/min   Anion gap 11 5 - 15    Comment: Performed at [facility], [address] [place], [place] [address]   *Note: Due to a large number of results and/or encounters for the requested time period, some results have not been displayed. A complete set of results can be found in Results Review.    ABGS No results for input(s): PHART, PO2ART, TCO2, HCO3 in the last 72 hours.  Invalid input(s): PCO2 CULTURES Recent Results (from the past 240 hour(s))  SARS Coronavirus 2 [facility] order, Performed in [facility] hospital lab) Nasopharyngeal Nasopharyngeal Swab     Status: None   Collection Time: 05/25/19 10:43 AM   Specimen: Nasopharyngeal Swab  Result Value Ref Range Status   SARS Coronavirus 2 NEGATIVE NEGATIVE Final    Comment: (NOTE) If result is NEGATIVE SARS-CoV-2 target nucleic acids are NOT DETECTED. The SARS-CoV-2 RNA is generally detectable in upper and lower  respiratory specimens during the acute phase of infection. The lowest  concentration of SARS-CoV-2 viral  copies this assay can detect is 250  copies / mL. A negative result does not preclude SARS-CoV-2 infection  and should not be used as the sole basis for treatment or other  patient management decisions.  A negative result may occur with  improper specimen collection / handling, submission of specimen other  than nasopharyngeal swab, presence of viral mutation(s) within the  areas targeted by this assay,  and inadequate number of viral copies  (<250 copies / mL). A negative result must be combined with clinical  observations, patient history, and epidemiological information. If result is POSITIVE SARS-CoV-2 target nucleic acids are DETECTED. The SARS-CoV-2 RNA is generally detectable in upper and lower  respiratory specimens dur ing the acute phase of infection.  Positive  results are indicative of active infection with SARS-CoV-2.  Clinical  correlation with patient history and other diagnostic information is  necessary to determine patient infection status.  Positive results do  not rule out bacterial infection or co-infection with other viruses. If result is PRESUMPTIVE POSTIVE SARS-CoV-2 nucleic acids MAY BE PRESENT.   A presumptive positive result was obtained on the submitted specimen  and confirmed on repeat testing.  While 2019 novel coronavirus  (SARS-CoV-2) nucleic acids may be present in the submitted sample  additional confirmatory testing may be necessary for epidemiological  and / or clinical management purposes  to differentiate between  SARS-CoV-2 and other Sarbecovirus currently known to infect humans.  If clinically indicated additional testing with an alternate test  methodology [phone]) is advised. The SARS-CoV-2 RNA is generally  detectable in upper and lower respiratory sp ecimens during the acute  phase of infection. The expected result is Negative. Fact Sheet for Patients:  StrictlyIdeas.no Fact Sheet for Healthcare Providers: BankingDealers.co.za This test is not yet approved or cleared by the [place] FDA and has been authorized for detection and/or diagnosis of SARS-CoV-2 by FDA under an Emergency Use Authorization (EUA).  This EUA will remain in effect (meaning this test can be used) for the duration of the COVID-19 declaration under Section 564(b)(1) of the Act, 21 U.S.C. section 360bbb-3(b)(1), unless  the authorization is terminated or revoked sooner. Performed at [facility], [address] [place], [place] [address]    Studies/Results: Dg Chest 2 View  Result Date: 05/25/2019 CLINICAL DATA:  Right chest pain with inhalation. The patient has been treated for pneumonia over the past month. EXAM: CHEST - 2 VIEW COMPARISON:  Single-view of the chest 04/19/2019. PA and lateral chest 04/18/2017. CT chest 04/19/2019. FINDINGS: The lungs are emphysematous. Right basilar airspace opacity is identified and slightly increased compared to the most recent plain films. Left lung is clear. Heart size is upper normal. Aortic atherosclerosis is noted. No pneumothorax or pleural fluid. No bony abnormality. IMPRESSION: Hazy right basilar airspace opacity appears slightly worse on the prior examination and could be due to atelectasis or pneumonia. Emphysema. Electronically Signed   By: Inge Rise M.D.   On: 05/25/2019 11:29   Ct Angio Chest Pe W Or Wo Contrast  Result Date: 05/26/2019 CLINICAL DATA:  Shortness of breath.  Productive cough. EXAM: CT ANGIOGRAPHY CHEST WITH CONTRAST TECHNIQUE: Multidetector CT imaging of the chest was performed using the standard protocol during bolus administration of intravenous contrast. Multiplanar CT image reconstructions and MIPs were obtained to evaluate the vascular anatomy. CONTRAST:  136mL OMNIPAQUE IOHEXOL 350 MG/ML SOLN COMPARISON:  CT scan of April 19, 2019. FINDINGS: Cardiovascular: Satisfactory opacification of the pulmonary arteries to the segmental level. No  evidence of pulmonary embolism. Normal heart size. No pericardial effusion. Atherosclerosis of thoracic aorta is noted without aneurysm or dissection. Mediastinum/Nodes: Moderate size sliding-type hiatal hernia is noted. Thyroid gland is unremarkable. No adenopathy is noted. Lungs/Pleura: No pneumothorax or pleural effusion is noted. 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 Mild bilateral posterior basilar  subsegmental atelectasis is noted. Upper Abdomen: No acute abnormality. Musculoskeletal: Status post surgical posterior fusion of the thoracic spine. Stable multiple old compression fractures are noted. No acute osseous abnormality is noted. Review of the MIP images confirms the above findings. IMPRESSION: No definite evidence of pulmonary embolus. Moderate size sliding-type hiatal hernia is noted. Aortic Atherosclerosis (ICD10-I70.0) and Emphysema (ICD10-J43.9). Electronically Signed   By: Marijo Conception M.D.   On: 05/26/2019 15:23    Medications:  Prior to Admission:  Medications Prior to Admission  Medication Sig Dispense Refill Last Dose  . acetaminophen (TYLENOL) 325 MG tablet Take 650 mg by mouth every 6 (six) hours as needed for moderate pain or headache.    unknown  . albuterol (VENTOLIN HFA) 108 (90 Base) MCG/ACT inhaler Inhale 2 puffs into the lungs every 6 (six) hours as needed for wheezing or shortness of breath. 18 g 2 05/24/2019  . ALPRAZolam (XANAX) 0.5 MG tablet Take 1 tablet by mouth at bedtime.    05/24/2019  . CALCIUM PO Take 1 tablet by mouth daily.   05/24/2019  . citalopram (CELEXA) 40 MG tablet Take 0.5 tablets (20 mg total) by mouth at bedtime.   05/24/2019  . denosumab (PROLIA) 60 MG/ML SOSY injection Inject 60 mg into the skin every 6 (six) months. 1 mL 0 Oct 2019  . ferrous sulfate 325 (65 FE) MG tablet Take 325 mg by mouth daily with breakfast.   05/24/2019  . fluticasone (FLONASE) 50 MCG/ACT nasal spray Place 2 sprays into both nostrils daily. 16 g 6 unknown  . ibuprofen (ADVIL) 200 MG tablet Take 400 mg by mouth every 6 (six) hours as needed for mild pain or moderate pain.   05/24/2019  . levothyroxine (SYNTHROID) 50 MCG tablet Take 1 tablet (50 mcg total) by mouth daily before breakfast.   05/24/2019  . lubiprostone (AMITIZA) 24 MCG capsule Take 24 mcg by mouth 2 (two) times daily with a meal.   05/24/2019  . MELATONIN PO Take 1 capsule by mouth daily as needed (sleep).    05/24/2019  . Multiple Vitamin (MULTIVITAMIN WITH MINERALS) TABS tablet Take 1 tablet by mouth daily.   05/24/2019  . ondansetron (ZOFRAN ODT) 4 MG disintegrating tablet Take 1 tablet (4 mg total) by mouth every 4 (four) hours as needed for nausea or vomiting.   unknown  . pantoprazole (PROTONIX) 40 MG tablet Take 1 tablet (40 mg total) by mouth daily before breakfast. 90 tablet 3 05/24/2019  . senna-docusate (SENOKOT-S) 8.6-50 MG tablet Take 1 tablet by mouth at bedtime as needed for mild constipation.   unknown  . umeclidinium-vilanterol (ANORO ELLIPTA) 62.5-25 MCG/INH AEPB Inhale 1 puff into the lungs daily. 90 each 3 Past Week at Unknown time   Scheduled: . citalopram  20 mg Oral QHS  . enoxaparin (LOVENOX) injection  40 mg Subcutaneous Q24H  . ferrous sulfate  325 mg Oral Q breakfast  . fluticasone  2 spray Each Nare Daily  . ipratropium-albuterol  3 mL Nebulization TID  . levETIRAcetam  500 mg Oral QHS  . levothyroxine  50 mcg Oral QAC breakfast  . methylPREDNISolone (SOLU-MEDROL) injection  40 mg Intravenous Q12H  .  multivitamin with minerals  1 tablet Oral Daily  . pantoprazole  40 mg Oral BID  . sodium chloride flush  3 mL Intravenous Q12H  . umeclidinium-vilanterol  1 puff Inhalation Daily   Continuous: . sodium chloride    . methylPREDNISolone (SOLU-MEDROL) injection     FVO:HKGOVP chloride, acetaminophen **OR** acetaminophen, acetaminophen, ALPRAZolam, bisacodyl, ondansetron (ZOFRAN) IV, polyvinyl alcohol, senna-docusate, sodium chloride flush  Assesment: She was admitted with acute hypoxic respiratory failure.  She initially was treated for pneumonia but CT does not show that.  She does have some atelectasis.  She is using incentive spirometry and she should continue that.  I think a lot of her problem with the chronic cough and congestion is that she is likely chronically aspirating based on her history.  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  She is on steroids and nebulizer treatments as needed.  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  She has had a stroke which of course makes her more likely to have trouble with swallowing Active Problems:   Acute hypoxemic respiratory failure ([place])    Plan: Continue treatments.  Continue incentive spirometry.  Continue meds for her GI problems although she may need other treatment.  Will defer that to Dr. Wynetta Emery.  For modified barium swallow today    LOS: 1 day   [name] 05/27/2019, 8:27 AM

## 2019-05-27 NOTE — Progress Notes (Addendum)
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  MDM/Assessment & Plan:   1. Acute on chronic hypoxemic respiratory failure- appreciate pulmonary consult, patient is clinically slowly improving.  We are working her up for silent aspiration.  MBS study being performed today.  Follow-up on those results.  Continue IV steroids reduce to once daily.  Continue bronchodilators. 2. History of lung cancer with metastasis to brain status post resection- outpatient follow-up with neurosurgery, continue Keppra. 3. Constipation- laxatives ordered. 4. Osteoporosis with compression fractures-continue outpatient Prolia. 5. Peptic ulcer disease-continue PPI therapy.  DVT prophylaxis: Lovenox Code Status: full Family Communication: Patient updated at bedside Disposition Plan: MBS study with speech therapy today, home 8/20   Consultants:  Pulmonary  Procedures:    Antimicrobials:     Subjective: Pt looking forward to doing MBS Today.  She has been having constipation.    Objective: Vitals:   05/27/19 0625 05/27/19 0731 05/27/19 0736 05/27/19 0800  BP: 117/76     Pulse: 76     Resp: 20     Temp: 98.6 F (37 C)     TempSrc: Oral     SpO2: 97% 97% 97% 96%  Weight:      Height:        Intake/Output Summary (Last 24 hours) at 05/27/2019 1205 Last data filed at 05/26/2019 2054 Gross per 24 hour  Intake --  Output 350 ml  Net -350 ml   Filed Weights   05/25/19 1028 05/25/19 1614  Weight: 53.1 kg 51.6 kg     REVIEW OF SYSTEMS  As per  history otherwise all reviewed and reported negative  Exam:  General exam: emaciated, chronically ill appearing female, NAD.  Respiratory system: faint basilar wheezes. No increased work of breathing. Cardiovascular system: S1 & S2 heard. No JVD, murmurs, gallops, clicks or pedal edema. Gastrointestinal system: Abdomen is nondistended, soft and nontender. Normal bowel sounds heard. Central nervous system: Alert and oriented. No focal neurological deficits. Extremities: no CCE.  Data Reviewed: Basic Metabolic Panel: Recent Labs  Lab 05/25/19 1135 05/26/19 0434 05/27/19 0509  NA 134* 133* 134*  K 3.9 3.8 4.0  CL 94* 98 97*  CO2 27 23 26   GLUCOSE 71 108* 111*  BUN 16 18 14   CREATININE 0.44 0.48 0.40*  CALCIUM 9.6 9.4 9.7   Liver Function Tests: Recent Labs  Lab 05/25/19 1135  AST 16  ALT 13  ALKPHOS 108  BILITOT 0.8  PROT 7.2  ALBUMIN 4.2   No results for input(s): LIPASE, AMYLASE in the last 168 hours. No results for input(s): AMMONIA in the last 168 hours. CBC: Recent Labs  Lab 05/25/19 1135 05/26/19 0434  WBC 6.1 5.8  NEUTROABS 4.9  --   HGB 12.7 12.2  HCT 40.1 38.2  MCV 90.7 91.2  PLT 380 358   Cardiac Enzymes: No results for input(s): CKTOTAL, CKMB, CKMBINDEX, TROPONINI in the last 168 hours. CBG (last 3)  No results for input(s): GLUCAP in the last 72 hours. Recent Results (from the past 240 hour(s))  SARS Coronavirus 2 [facility] order, Performed in [facility]  hospital lab) Nasopharyngeal Nasopharyngeal Swab     Status: None   Collection Time: 05/25/19 10:43 AM   Specimen: Nasopharyngeal Swab  Result Value Ref Range Status   SARS Coronavirus 2 NEGATIVE NEGATIVE Final    Comment: (NOTE) If result is NEGATIVE SARS-CoV-2 target nucleic acids are NOT DETECTED. The SARS-CoV-2 RNA is generally detectable in upper and lower  respiratory specimens during the acute phase of infection. The lowest  concentration of SARS-CoV-2 viral copies this assay can  detect is 250  copies / mL. A negative result does not preclude SARS-CoV-2 infection  and should not be used as the sole basis for treatment or other  patient management decisions.  A negative result may occur with  improper specimen collection / handling, submission of specimen other  than nasopharyngeal swab, presence of viral mutation(s) within the  areas targeted by this assay, and inadequate number of viral copies  (<250 copies / mL). A negative result must be combined with clinical  observations, patient history, and epidemiological information. If result is POSITIVE SARS-CoV-2 target nucleic acids are DETECTED. The SARS-CoV-2 RNA is generally detectable in upper and lower  respiratory specimens dur ing the acute phase of infection.  Positive  results are indicative of active infection with SARS-CoV-2.  Clinical  correlation with patient history and other diagnostic information is  necessary to determine patient infection status.  Positive results do  not rule out bacterial infection or co-infection with other viruses. If result is PRESUMPTIVE POSTIVE SARS-CoV-2 nucleic acids MAY BE PRESENT.   A presumptive positive result was obtained on the submitted specimen  and confirmed on repeat testing.  While 2019 novel coronavirus  (SARS-CoV-2) nucleic acids may be present in the submitted sample  additional confirmatory testing may be necessary for epidemiological  and / or clinical management purposes  to differentiate between  SARS-CoV-2 and other Sarbecovirus currently known to infect humans.  If clinically indicated additional testing with an alternate test  methodology [phone]) is advised. The SARS-CoV-2 RNA is generally  detectable in upper and lower respiratory sp ecimens during the acute  phase of infection. The expected result is Negative. Fact Sheet for Patients:  StrictlyIdeas.no Fact Sheet for Healthcare  Providers: BankingDealers.co.za This test is not yet approved or cleared by the [place] FDA and has been authorized for detection and/or diagnosis of SARS-CoV-2 by FDA under an Emergency Use Authorization (EUA).  This EUA will remain in effect (meaning this test can be used) for the duration of the COVID-19 declaration under Section 564(b)(1) of the Act, 21 U.S.C. section 360bbb-3(b)(1), unless the authorization is terminated or revoked sooner. Performed at [facility], [address] [place], [place] [address]      Studies: Ct Angio Chest Pe W Or Wo Contrast  Result Date: 05/26/2019 CLINICAL DATA:  Shortness of breath.  Productive cough. EXAM: CT ANGIOGRAPHY CHEST WITH CONTRAST TECHNIQUE: Multidetector CT imaging of the chest was performed using the standard protocol during bolus administration of intravenous contrast. Multiplanar CT image reconstructions and MIPs were obtained to evaluate the vascular anatomy. CONTRAST:  138mL OMNIPAQUE IOHEXOL 350 MG/ML SOLN COMPARISON:  CT scan of April 19, 2019. FINDINGS: Cardiovascular: Satisfactory opacification of the pulmonary arteries to the segmental level. No evidence of pulmonary embolism. Normal heart size. No pericardial effusion. Atherosclerosis of thoracic aorta is noted without aneurysm or dissection. Mediastinum/Nodes: Moderate size sliding-type hiatal hernia is noted. Thyroid gland is unremarkable. No adenopathy is noted. Lungs/Pleura: No pneumothorax or pleural effusion is noted. Emphysematous disease  is noted in both lungs. Mild bilateral posterior basilar subsegmental atelectasis is noted. Upper Abdomen: No acute abnormality. Musculoskeletal: Status post surgical posterior fusion of the thoracic spine. Stable multiple old compression fractures are noted. No acute osseous abnormality is noted. Review of the MIP images confirms the above findings. IMPRESSION: No definite evidence of pulmonary embolus. Moderate size  sliding-type hiatal hernia is noted. Aortic Atherosclerosis (ICD10-I70.0) and Emphysema (ICD10-J43.9). Electronically Signed   By: Marijo Conception M.D.   On: 05/26/2019 15:23   Scheduled Meds:  citalopram  20 mg Oral QHS   enoxaparin (LOVENOX) injection  40 mg Subcutaneous Q24H   ferrous sulfate  325 mg Oral Q breakfast   fluticasone  2 spray Each Nare Daily   ipratropium-albuterol  3 mL Nebulization TID   levETIRAcetam  500 mg Oral QHS   levothyroxine  50 mcg Oral QAC breakfast   [START ON 05/28/2019] methylPREDNISolone (SOLU-MEDROL) injection  40 mg Intravenous Daily   multivitamin with minerals  1 tablet Oral Daily   pantoprazole  40 mg Oral BID   sodium chloride flush  3 mL Intravenous Q12H   umeclidinium-vilanterol  1 puff Inhalation Daily   Continuous Infusions:  sodium chloride      Active Problems:   Acute hypoxemic respiratory failure ([place])  Time spent:   Irwin Brakeman, MD [facility] 05/27/2019, 12:05 PM    LOS: 1 day  How to contact the [facility] Attending or Consulting provider [place] or covering provider during after hours [place], for this patient?  1. Check the care team in [facility] and look for a) attending/consulting [facility] provider listed and b) the [facility] team listed 2. Log into [email or web address] and use [place]'s universal password to access. If you do not have the password, please contact the hospital operator. 3. Locate the [facility] provider you are looking for under [facility] and page to a number that you can be directly reached. 4. If you still have difficulty reaching the provider, please page the [facility] (Director on Call) for the Hospitalists listed on amion for assistance.

## 2019-05-28 DIAGNOSIS — C3490 Malignant neoplasm of unspecified part of unspecified bronchus or lung: Secondary | ICD-10-CM

## 2019-05-28 DIAGNOSIS — J449 Chronic obstructive pulmonary disease, unspecified: Secondary | ICD-10-CM

## 2019-05-28 DIAGNOSIS — R1013 Epigastric pain: Secondary | ICD-10-CM

## 2019-05-28 DIAGNOSIS — R0609 Other forms of dyspnea: Secondary | ICD-10-CM

## 2019-05-28 DIAGNOSIS — K5900 Constipation, unspecified: Secondary | ICD-10-CM

## 2019-05-28 DIAGNOSIS — R1310 Dysphagia, unspecified: Secondary | ICD-10-CM

## 2019-05-28 LAB — LEGIONELLA PNEUMOPHILA SEROGP 1 UR AG: L. pneumophila Serogp 1 Ur Ag: NEGATIVE

## 2019-05-28 MED ORDER — POLYETHYLENE GLYCOL 3350 17 G PO PACK: 17.0000 g | PACK | Freq: Every day | ORAL | 0 refills | Status: DC | PRN

## 2019-05-28 MED ORDER — PREDNISONE 20 MG PO TABS: ORAL_TABLET | ORAL | 0 refills | Status: DC

## 2019-05-28 MED ORDER — LEVETIRACETAM 500 MG PO TABS: 500.0000 mg | ORAL_TABLET | Freq: Two times a day (BID) | ORAL | 2 refills | Status: DC

## 2019-05-28 MED ORDER — ACETAMINOPHEN 325 MG PO TABS: 650.0000 mg | ORAL_TABLET | Freq: Four times a day (QID) | ORAL | Status: DC

## 2019-05-28 NOTE — Progress Notes (Addendum)
She had modified barium swallow yesterday and did much better than I anticipated.  She says she has been practicing her swallowing.  Previously she had been getting choked and I think she probably does  aspirate.  Plans are for her to be discharged home today.  She wants to follow-up in my office and I will arrange that.  Thanks for allowing me to see her with you

## 2019-05-28 NOTE — Care Management (Addendum)
Patient discharging home today, home health RN ordered for patient. Call to discuss options via phone with patient. She reports services that recently started, she refers to the agency as "[facility]", stating that she access to RN, PA via phone but that they will make home visits if needed. Questioned patient that this may be [facility] service, she is unsure really of the name. [facility] is listed on chart as a benefit, but active status is not noted at this time.   Discussed what home health consists of and it is felt that home health services would be a duplicate service at this time. CM did make patient aware that PCP can order home health if she feels it is needed at a later time.    Patient has follow up appointments with Cardiology, oncology and pulmonology. Dr. Luan Pulling office will call patient to schedule appointment.

## 2019-05-28 NOTE — Discharge Instructions (Signed)
Community-Acquired Pneumonia, Adult Pneumonia is an infection of the lungs. It causes swelling in the airways of the lungs. Mucus and fluid may also build up inside the airways. One type of pneumonia can happen while a person is in a hospital. A different type can happen when a person is not in a hospital (community-acquired pneumonia).  What are the causes?  This condition is caused by germs (viruses, bacteria, or fungi). Some types of germs can be passed from one person to another. This can happen when you breathe in droplets from the cough or sneeze of an infected person. What increases the risk? You are more likely to develop this condition if you:  Have a long-term (chronic) disease, such as: ? Chronic obstructive pulmonary disease (COPD). ? Asthma. ? Cystic fibrosis. ? Congestive heart failure. ? Diabetes. ? Kidney disease.  Have HIV.  Have sickle cell disease.  Have had your spleen removed.  Do not take good care of your teeth and mouth (poor dental hygiene).  Have a medical condition that increases the risk of breathing in droplets from your own mouth and nose.  Have a weakened body defense system (immune system).  Are a smoker.  Travel to areas where the germs that cause this illness are common.  Are around certain animals or the places they live. What are the signs or symptoms?  A dry cough.  A wet (productive) cough.  Fever.  Sweating.  Chest pain. This often happens when breathing deeply or coughing.  Fast breathing or trouble breathing.  Shortness of breath.  Shaking chills.  Feeling tired (fatigue).  Muscle aches. How is this treated? Treatment for this condition depends on many things. Most adults can be treated at home. In some cases, treatment must happen in a hospital. Treatment may include:  Medicines given by mouth or through an IV tube.  Being given extra oxygen.  Respiratory therapy. In rare cases, treatment for very bad pneumonia  may include:  Using a machine to help you breathe.  Having a procedure to remove fluid from around your lungs. Follow these instructions at home: Medicines  Take over-the-counter and prescription medicines only as told by your doctor. ? Only take cough medicine if you are losing sleep.  If you were prescribed an antibiotic medicine, take it as told by your doctor. Do not stop taking the antibiotic even if you start to feel better. General instructions   Sleep with your head and neck raised (elevated). You can do this by sleeping in a recliner or by putting a few pillows under your head.  Rest as needed. Get at least 8 hours of sleep each night.  Drink enough water to keep your pee (urine) pale yellow.  Eat a healthy diet that includes plenty of vegetables, fruits, whole grains, low-fat dairy products, and lean protein.  Do not use any products that contain nicotine or tobacco. These include cigarettes, e-cigarettes, and chewing tobacco. If you need help quitting, ask your doctor.  Keep all follow-up visits as told by your doctor. This is important. How is this prevented? A shot (vaccine) can help prevent pneumonia. Shots are often suggested for:  People older than 78 years of age.  People older than 78 years of age who: ? Are having cancer treatment. ? Have long-term (chronic) lung disease. ? Have problems with their body's defense system. You may also prevent pneumonia if you take these actions:  Get the flu (influenza) shot every year.  Go to the dentist as  often as told.  Wash your hands often. If you cannot use soap and water, use hand sanitizer. Contact a doctor if:  You have a fever.  You lose sleep because your cough medicine does not help. Get help right away if:  You are short of breath and it gets worse.  You have more chest pain.  Your sickness gets worse. This is very serious if: ? You are an older adult. ? Your body's defense system is weak.  You  cough up blood. Summary  Pneumonia is an infection of the lungs.  Most adults can be treated at home. Some will need treatment in a hospital.  Drink enough water to keep your pee pale yellow.  Get at least 8 hours of sleep each night. This information is not intended to replace advice given to you by your health care provider. Make sure you discuss any questions you have with your health care provider. Document Released: 03/12/2008 Document Revised: 01/14/2019 Document Reviewed: 05/22/2018 Elsevier Patient Education  2020 [employer].   Aspiration Precautions, Adult Aspiration is the breathing in (inhalation) of a liquid or object into the lungs. Things that can be inhaled into the lungs include:  Food.  Any type of liquid, such as drinks or saliva.  Stomach contents, such as vomit or stomach acid. What are the signs of aspiration? Signs of aspiration include:  Coughing after swallowing food or liquids.  Clearing the throat often while eating.  Trouble breathing. This may include: ? Breathing quickly. ? Breathing very slowly. ? Loud breathing. ? Rumbling sounds from the lungs while breathing.  Coughing up phlegm (sputum) that: ? Is yellow, tan, or green. ? Has pieces of food in it. ? Is bad-smelling.  Having a hoarse, barky cough.  Not being able to speak.  A hoarse voice.  Drooling while eating.  A feeling of fullness in the throat or a feeling that something is stuck in the throat.  Choking often.  Having a runny noise while eating.  Coughing when lying down or having to sit up quickly after lying down.  A change in skin color. The skin may look red or blue.  Fever.  Watery eyes.  Pain in the chest or back.  A pained look on the face. What are the complications of aspiration? Complications of aspiration include:  Losing weight because the person is not absorbing needed nutrients.  Loss of enjoyment and the social benefits of  eating.  Choking.  Lung irritation, if someone aspirates acidic food or drinks.  Lung infection (pneumonia).  Collection of infected liquid (pus) in the lungs (lung abscess). In serious cases, death can occur. What can I do to prevent aspiration? Caring for someone who has a feeding tube If you are caring for someone who has a feeding tube who cannot eat or drink safely through his or her mouth:  Keep the person in an upright position as much as possible.  Do not lay the person flat if he or she is getting continuous feedings. If you need to lay the person flat for any reason, turn the feeding pump off.  Check feeding tube residuals as told by your health care provider. Ask your health care provider what residual amount is too high. Caring for someone who can eat and drink safely by mouth If you are caring for someone who can eat and drink safely through his or her mouth:  Have the person sit in an upright position when eating food or drinking  fluids. This can be done in two ways: ? Have the person sit up in a chair. ? If sitting in a chair is not possible, position the person in bed so he or she is upright.  Remind the person to eat slowly and chew well. Make sure the person is awake and alert while eating.  Do not distract the person. This is especially important for people with thinking or memory (cognitive) problems.  Allow foods to cool. Hot foods may be more difficult to swallow.  Provide small meals more frequently, instead of 3 large meals. This may reduce fatigue during eating.  Check the person's mouth thoroughly for leftover food after eating.  Keep the person sitting upright for 30-45 minutes after eating.  Do not serve food or drink during 2 hours or more before bedtime. General instructions Follow these general guidelines to prevent aspiration in someone who can eat and drink safely by mouth:  Never put food or liquids in the mouth of a person who is not fully  alert.  Feed small amounts of food. Do not force feed.  For a person who is on a diet for swallowing difficulty (dysphagia diet), follow the recommended food and drink consistency. For example, in dysphagia diet level 1, thicken liquids to pudding-like consistency.  Use as little water as possible when brushing the person's teeth or cleaning his or her mouth.  Provide oral care before and after meals.  Use adaptive devices such as cut-out cups, straws, or utensils as told by the health care provider.  Crush pills and put them in soft food such as pudding or ice cream. Some pills should not be crushed. Check with the health care provider before crushing any medicine. Contact a health care provider if:  The person has a feeding tube, and the feeding tube residual amount is too high.  The person has a fever.  The person tries to avoid food or water, such as refusing to eat, drink, or be fed, or is eating less than normal.  The person may have aspirated food or liquid.  You notice warning signs, such as choking or coughing, when the person eats or drinks. Get help right away if:  The person has trouble breathing or starts to breathe quickly.  The person is breathing very slowly or stops breathing.  The person coughs a lot after eating or drinking.  The person has a long-lasting (chronic) cough.  The person coughs up thick, yellow, or tan sputum.  If someone is choking on food or an object, perform the Heimlich maneuver (abdominal thrusts).  The person has symptoms of pneumonia, such as: ? Coughing a lot. ? Coughing up mucus with a bad smell or blood in it. ? Feeling short of breath. ? Complaining of chest pain. ? Sweating, fever, and chills. ? Feeling tired. ? Complaining of trouble breathing. ? Wheezing.  The person cannot stop choking.  The person is unable to breathe, turns blue, faints, or seems confused. These symptoms may represent a serious problem that is an  emergency. Do not wait to see if the symptoms will go away. Get medical help right away. Call your local emergency services (911 in the [place]  Summary  Aspiration is the breathing in (inhalation) of a liquid or object into the lungs. Things that can be inhaled into the lungs include food, liquids, saliva, or stomach contents.  Aspiration can cause pneumonia or choking.  One sign of aspiration is coughing after swallowing food or liquids.  Contact a health care provider if you notice signs of aspiration. This information is not intended to replace advice given to you by your health care provider. Make sure you discuss any questions you have with your health care provider. Document Released: 10/27/2010 Document Revised: 09/06/2017 Document Reviewed: 06/21/2016 Elsevier Patient Education  2020 [place].   IMPORTANT INFORMATION: PAY CLOSE ATTENTION   PHYSICIAN DISCHARGE INSTRUCTIONS  Follow with Primary care provider  [name], MD  and other consultants as instructed by your Hospitalist Physician  [facility] IF SYMPTOMS COME BACK, WORSEN OR NEW PROBLEM DEVELOPS   Please note: You were cared for by a hospitalist during your hospital stay. Every effort will be made to forward records to your primary care provider.  You can request that your primary care provider send for your hospital records if they have not received them.  Once you are discharged, your primary care physician will handle any further medical issues. Please note that NO REFILLS for any discharge medications will be authorized once you are discharged, as it is imperative that you return to your primary care physician (or establish a relationship with a primary care physician if you do not have one) for your post hospital discharge needs so that they can reassess your need for medications and monitor your lab values.  Please get a complete blood count and chemistry panel checked by  your Primary MD at your next visit, and again as instructed by your Primary MD.  Get Medicines reviewed and adjusted: Please take all your medications with you for your next visit with your Primary MD  Laboratory/radiological data: Please request your Primary MD to go over all hospital tests and procedure/radiological results at the follow up, please ask your primary care provider to get all Hospital records sent to his/her office.  In some cases, they will be blood work, cultures and biopsy results pending at the time of your discharge. Please request that your primary care provider follow up on these results.  If you are diabetic, please bring your blood sugar readings with you to your follow up appointment with primary care.    Please call and make your follow up appointments as soon as possible.    Also Note the following: If you experience worsening of your admission symptoms, develop shortness of breath, life threatening emergency, suicidal or homicidal thoughts you must seek medical attention immediately by calling 911 or calling your MD immediately  if symptoms less severe.  You must read complete instructions/literature along with all the possible adverse reactions/side effects for all the Medicines you take and that have been prescribed to you. Take any new Medicines after you have completely understood and accpet all the possible adverse reactions/side effects.   Do not drive when taking Pain medications or sleeping medications (Benzodiazepines)  Do not take more than prescribed Pain, Sleep and Anxiety Medications. It is not advisable to combine anxiety,sleep and pain medications without talking with your primary care practitioner  Special Instructions: If you have smoked or chewed Tobacco  in the last 2 yrs please stop smoking, stop any regular Alcohol  and or any Recreational drug use.  Wear Seat belts while driving.  Do not drive if taking any narcotic, mind altering or  controlled substances or recreational drugs or alcohol.      Pleurisy Pleurisy is irritation and swelling (inflammation) of the linings of your lungs (pleura). This can cause pain in your chest, back, or  shoulder. It can also cause trouble breathing. Follow these instructions at home: Medicines  Take over-the-counter and prescription medicines only as told by your doctor.  If you were prescribed antibiotic medicine, take it as told by your doctor. Do not stop taking the antibiotic even if you start to feel better. Activity  Rest and return to your normal activities as told by your doctor. Ask your doctor what activities are safe for you.  Do not drive or use heavy machinery while taking prescription pain medicine. General instructions   Watch for any changes in your condition.  Take deep breaths often, even if it is painful. This can help prevent lung problems.  When lying down, lie on your painful side. This may help you feel less pain.  Do not smoke. If you need help quitting, ask your doctor.  Keep all follow-up visits as told by your doctor. This is important. Contact a doctor if:  You have pain that: ? Gets worse. ? Does not get better with medicine. ? Lasts for more than 1 week.  You have a fever or chills.  You have a cough that does not get better at home.  You have trouble breathing that does not get better at home.  You cough up liquid that looks like pus (purulent secretions). Get help right away if:  Your lips, fingernails, or toenails turn dark or turn blue.  You cough up blood.  You have trouble breathing that gets worse.  You are making loud noises when you breathe (wheezing) and this gets worse.  You have pain that spreads to your neck, arms, or jaw.  You get a rash.  You throw up (vomit).  You pass out (faint). Summary  Pleurisy is irritation and swelling (inflammation) of the linings of your lungs (pleura).  Pleurisy can cause pain  and trouble breathing.  If you have a cough that does not get better at home, contact your doctor.  Get help right away if you are having trouble breathing and it is getting worse. This information is not intended to replace advice given to you by your health care provider. Make sure you discuss any questions you have with your health care provider. Document Released: 09/06/2008 Document Revised: 09/06/2017 Document Reviewed: 06/18/2016 Elsevier Patient Education  2020 [employer].

## 2019-05-28 NOTE — Discharge Summary (Addendum)
Physician Discharge Summary  [name] HAL:[id number] DOB: 1940/11/01 DOA: 05/25/2019  PCP: [name], MD  Admit date: 05/25/2019 Discharge date: 05/28/2019  Admitted From: Home  Disposition:  Home   Recommendations for Outpatient Follow-up:  Follow up with PCP in 1 weeks Please follow up with oncology and neurosurgery as scheduled Follow up with pulmonology Dr. Luan Pulling as scheduled   Home Health: RN  Discharge Condition: STABLE   CODE STATUS: FULL    Brief Hospitalization Summary: Please see all hospital notes, images, labs for full details of the hospitalization. Dr. Trena Platt HPI: [name] is a 78 y.o. 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  She was also trialed on a prednisone taper in the outpatient setting with no relief of her symptoms.  She denies any fevers or chills at home and does not have a significant cough or sputum production.  It appears that she was recently seen by an ENT physician Dr. Redmond Baseman at [facility] for pharyngeal dysphagia and is noted to have recurrent swallowing issues from her recent stroke and brain surgery.  She is recommended to have a modified barium swallow for further evaluation.  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   ED Course: Vital signs are stable and patient is afebrile.  Laboratory data is otherwise unremarkable with no leukocytosis and lactic acid level of 0.8 I have ordered a d-dimer as well as a procalcitonin level, which are currently pending.  Patient was started empirically on Rocephin IV as well as oral doxycycline.  Two-view chest x-ray with some right sided infiltrate noted that may reflect pneumonia versus atelectasis.  She is in no respiratory distress and is currently on 3 L nasal cannula.  She appeared to maintain her oxygen saturation with ambulation, but complained of chest tightness and shortness of breath and towards the end of her walk desaturated to approximately 88%.  Brief Admission Hx: 78 y/o female with COPD and chronic hypoxemia on 2 L nasal cannula, prior tobacco abuse, lung cancer with brain metastases status post resection 04/27/2019, hypothyroidism, osteoporosis with prior lumbar compression fractures has been treated for multiple bouts of CAP suspect recurrent aspiration secondary to previous cerebrovascular disease from previous stroke.   MDM/Assessment & Plan:   Aspiration pneumonia. Acute on chronic hypoxemic respiratory failure- appreciate pulmonary consult, patient is clinically slowly improving.  We are working her up for silent aspiration.  MBS study was done 8/19 and patient performed well and it was recommended for regular diet, thin liquids.    IV steroids reduced to once daily oral prednisone 40 mg daily x 5 days.  DC home with outpatient follow up.  Pt is following up with Dr. Luan Pulling as well.  History of lung cancer with metastasis to brain status post resection- outpatient follow-up with neurosurgery and oncology, continue Keppra. Constipation- laxatives ordered.  Osteoporosis with compression fractures-continue outpatient Prolia. Peptic ulcer disease-continue PPI therapy.   DVT prophylaxis: Lovenox Code  Status: full Family Communication: Patient updated at bedside Disposition Plan: Home    Consultants: Pulmonary   Procedures:     Antimicrobials:   Discharge Diagnoses:  Active Problems:   Epigastric pain   Dysphagia   Adenocarcinoma of lung (HCC)   Constipation   COPD (chronic obstructive pulmonary disease) (HCC)   Exertional dyspnea   Acute hypoxemic respiratory failure [facility])   Discharge Instructions: Discharge Instructions     Call MD for:  difficulty breathing, headache or visual disturbances   Complete by: As directed    Call MD for:  extreme fatigue   Complete by: As directed    Call MD for:  persistant dizziness or light-headedness   Complete by: As directed    Call MD for:  persistant nausea and vomiting   Complete by: As directed    Call MD for:  severe uncontrolled pain   Complete by: As directed    Increase activity slowly   Complete by: As directed       Allergies as of 05/28/2019       Reactions   Ciprofloxacin Hcl Hives   Sulfonamide Derivatives Hives, Rash   Iohexol Other (See Comments)   UNSPECIFIED REACTION  Desc: Per [facility], pt is allergic to IV contrast, no type of reaction was available. Pt cannot remember but first reacted around 15 yrs ago from an IVP. Notes were date from 2009 at urology center., [place] Date: [id number]   Prozac [fluoxetine Hcl] Rash        Medication List     STOP taking these medications    ibuprofen 200 MG tablet Commonly known as: ADVIL       TAKE these medications    acetaminophen 325 MG tablet Commonly known as: TYLENOL Take 2 tablets (650 mg total) by mouth every 6 (six) hours. What changed:  when to take this reasons to take this   albuterol 108 (90 Base) MCG/ACT inhaler Commonly known as: VENTOLIN HFA Inhale 2 puffs into the lungs every 6 (six) hours as needed for wheezing or shortness of breath.   ALPRAZolam 0.5 MG tablet Commonly known as: XANAX Take 1 tablet by mouth at bedtime.    Amitiza 24 MCG capsule Generic drug: lubiprostone Take 24 mcg by mouth 2 (two) times daily with a meal.   CALCIUM PO Take 1 tablet by mouth daily.   citalopram 40 MG tablet Commonly known as: CELEXA Take 0.5 tablets (20 mg total) by mouth at bedtime.   ferrous sulfate 325 (65 FE) MG tablet Take 325 mg by mouth daily with breakfast.   fluticasone 50 MCG/ACT nasal spray Commonly known as: FLONASE Place 2 sprays into both nostrils daily.   levETIRAcetam 500 MG tablet Commonly known as: Keppra Take 1 tablet (500 mg total) by mouth 2 (two) times daily.   levothyroxine 50 MCG tablet Commonly known as: SYNTHROID Take 1 tablet (50 mcg total) by mouth daily before breakfast.   MELATONIN PO Take 1 capsule by mouth daily as needed (sleep).   multivitamin with minerals Tabs tablet Take 1 tablet by mouth daily.   ondansetron 4 MG disintegrating tablet Commonly known as: Zofran ODT Take 1 tablet (4 mg total) by mouth every 4 (four) hours as needed for nausea or vomiting.   pantoprazole 40 MG tablet Commonly known as: PROTONIX Take 1 tablet (40 mg total) by mouth daily before breakfast.  polyethylene glycol 17 g packet Commonly known as: MIRALAX / GLYCOLAX Take 17 g by mouth daily as needed for mild constipation.   predniSONE 20 MG tablet Commonly known as: DELTASONE Take 2 PO QAM x5 days   Prolia 60 MG/ML Sosy injection Generic drug: denosumab Inject 60 mg into the skin every 6 (six) months.   senna-docusate 8.6-50 MG tablet Commonly known as: Senokot-S Take 1 tablet by mouth at bedtime as needed for mild constipation.   umeclidinium-vilanterol 62.5-25 MCG/INH Aepb Commonly known as: Anoro Ellipta Inhale 1 puff into the lungs daily.       Follow-up Information     [name], MD. Schedule an appointment as soon as possible for a visit in 1 week(s).   Specialty: Family Medicine Contact information: [address] [place]  [address] [phone]         Herminio Commons, MD. Schedule an appointment as soon as possible for a visit in 1 month(s).   Specialty: Cardiology Contact information: [place] [address] [phone]         Sinda Du, MD. Schedule an appointment as soon as possible for a visit in 2 week(s).   Specialty: Pulmonary Disease Why: Office Will Call You With Appointment  Contact information: [place] [address] [phone]           Allergies  Allergen Reactions   Ciprofloxacin Hcl Hives   Sulfonamide Derivatives Hives and Rash   Iohexol Other (See Comments)    UNSPECIFIED REACTION  Desc: Per [facility], pt is allergic to IV contrast, no type of reaction was available. Pt cannot remember but first reacted around 15 yrs ago from an IVP. Notes were date from 2009 at urology center., [place] Date: [id number]    Prozac [Fluoxetine Hcl] Rash   Allergies as of 05/28/2019       Reactions   Ciprofloxacin Hcl Hives   Sulfonamide Derivatives Hives, Rash   Iohexol Other (See Comments)   UNSPECIFIED REACTION  Desc: Per [facility], pt is allergic to IV contrast, no type of reaction was available. Pt cannot remember but first reacted around 15 yrs ago from an IVP. Notes were date from 2009 at urology center., [place] Date: [id number]   Prozac [fluoxetine Hcl] Rash        Medication List     STOP taking these medications    ibuprofen 200 MG tablet Commonly known as: ADVIL       TAKE these medications    acetaminophen 325 MG tablet Commonly known as: TYLENOL Take 2 tablets (650 mg total) by mouth every 6 (six) hours. What changed:  when to take this reasons to take this   albuterol 108 (90 Base) MCG/ACT inhaler Commonly known as: VENTOLIN HFA Inhale 2 puffs into the lungs every 6 (six) hours as needed for wheezing or shortness of breath.   ALPRAZolam 0.5 MG tablet Commonly known as: XANAX Take 1 tablet by mouth  at bedtime.   Amitiza 24 MCG capsule Generic drug: lubiprostone Take 24 mcg by mouth 2 (two) times daily with a meal.   CALCIUM PO Take 1 tablet by mouth daily.   citalopram 40 MG tablet Commonly known as: CELEXA Take 0.5 tablets (20 mg total) by mouth at bedtime.   ferrous sulfate 325 (65 FE) MG tablet Take 325 mg by mouth daily with breakfast.   fluticasone 50 MCG/ACT nasal spray Commonly known as: FLONASE Place 2 sprays into both  nostrils daily.   levETIRAcetam 500 MG tablet Commonly known as: Keppra Take 1 tablet (500 mg total) by mouth 2 (two) times daily.   levothyroxine 50 MCG tablet Commonly known as: SYNTHROID Take 1 tablet (50 mcg total) by mouth daily before breakfast.   MELATONIN PO Take 1 capsule by mouth daily as needed (sleep).   multivitamin with minerals Tabs tablet Take 1 tablet by mouth daily.   ondansetron 4 MG disintegrating tablet Commonly known as: Zofran ODT Take 1 tablet (4 mg total) by mouth every 4 (four) hours as needed for nausea or vomiting.   pantoprazole 40 MG tablet Commonly known as: PROTONIX Take 1 tablet (40 mg total) by mouth daily before breakfast.   polyethylene glycol 17 g packet Commonly known as: MIRALAX / GLYCOLAX Take 17 g by mouth daily as needed for mild constipation.   predniSONE 20 MG tablet Commonly known as: DELTASONE Take 2 PO QAM x5 days   Prolia 60 MG/ML Sosy injection Generic drug: denosumab Inject 60 mg into the skin every 6 (six) months.   senna-docusate 8.6-50 MG tablet Commonly known as: Senokot-S Take 1 tablet by mouth at bedtime as needed for mild constipation.   umeclidinium-vilanterol 62.5-25 MCG/INH Aepb Commonly known as: Anoro Ellipta Inhale 1 puff into the lungs daily.        Procedures/Studies: Dg Chest 2 View  Result Date: 05/25/2019 CLINICAL DATA:  Right chest pain with inhalation. The patient has been treated for pneumonia over the past month. EXAM: CHEST - 2 VIEW COMPARISON:   Single-view of the chest 04/19/2019. PA and lateral chest 04/18/2017. CT chest 04/19/2019. FINDINGS: The lungs are emphysematous. Right basilar airspace opacity is identified and slightly increased compared to the most recent plain films. Left lung is clear. Heart size is upper normal. Aortic atherosclerosis is noted. No pneumothorax or pleural fluid. No bony abnormality. IMPRESSION: Hazy right basilar airspace opacity appears slightly worse on the prior examination and could be due to atelectasis or pneumonia. Emphysema. Electronically Signed   By: Inge Rise M.D.   On: 05/25/2019 11:29   Ct Angio Chest Pe W Or Wo Contrast  Result Date: 05/26/2019 CLINICAL DATA:  Shortness of breath.  Productive cough. EXAM: CT ANGIOGRAPHY CHEST WITH CONTRAST TECHNIQUE: Multidetector CT imaging of the chest was performed using the standard protocol during bolus administration of intravenous contrast. Multiplanar CT image reconstructions and MIPs were obtained to evaluate the vascular anatomy. CONTRAST:  175mL OMNIPAQUE IOHEXOL 350 MG/ML SOLN COMPARISON:  CT scan of April 19, 2019. FINDINGS: Cardiovascular: Satisfactory opacification of the pulmonary arteries to the segmental level. No evidence of pulmonary embolism. Normal heart size. No pericardial effusion. Atherosclerosis of thoracic aorta is noted without aneurysm or dissection. Mediastinum/Nodes: Moderate size sliding-type hiatal hernia is noted. Thyroid gland is unremarkable. No adenopathy is noted. Lungs/Pleura: No pneumothorax or pleural effusion is noted. 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 Mild bilateral posterior basilar subsegmental atelectasis is noted. Upper Abdomen: No acute abnormality. Musculoskeletal: Status post surgical posterior fusion of the thoracic spine. Stable multiple old compression fractures are noted. No acute osseous abnormality is noted. Review of the MIP images confirms the above findings. IMPRESSION: No definite evidence of  pulmonary embolus. Moderate size sliding-type hiatal hernia is noted. Aortic Atherosclerosis (ICD10-I70.0) and Emphysema (ICD10-J43.9). Electronically Signed   By: Marijo Conception M.D.   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[place])   corrected by surgery 08/21/13  Anxiety disorder   Aortic aneurysm (HCC)   Brain metastasis (HCC)   lung cancer s/p resection 9/93  Complication of anesthesia 2011  bloodpressure dropped during colonoscopy, not problems since  Compression fracture   COPD (chronic obstructive pulmonary disease) (HCC)   Depression   Diastolic dysfunction   Diverticulosis   Dysphagia   Emphysema lung ([place]) 11/08/2014  Headache   Hiatal hernia   History of kidney stones   Hypothyroidism   IBS (irritable bowel syndrome)   Iron deficiency anemia due to chronic blood loss 12/05/2016  On home O2 12/15/2013  chronic hypoxia  Pneumonia   PUD (peptic ulcer disease)   41yrs  Shortness of breath   with exertion  SIADH (syndrome of inappropriate ADH production) ([place])   Trigeminal neuralgia  Past Surgical History: Past Surgical History: Procedure Laterality Date  ABDOMINAL HYSTERECTOMY    APPLICATION OF CRANIAL NAVIGATION N/A 04/22/9677  Procedure: APPLICATION OF CRANIAL NAVIGATION;  Surgeon: Consuella Lose, MD;  Location: [facility];  Service: Neurosurgery;  Laterality: N/A;  BACK SURGERY  January 13, 2014  CATARACT EXTRACTION    CATARACT EXTRACTION W/PHACO  09/02/2012  Procedure: CATARACT EXTRACTION PHACO AND  INTRAOCULAR LENS PLACEMENT (IOC);  Surgeon: Elta Guadeloupe T. 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 Oneida Alar: moderate diverticulosis, hemorrhoids, redundant colon. next TCS in 10-15 years.   CRANIOTOMY Left 03/18/2019  Procedure: Left stereotactic craniotomy for tumor resection;  Surgeon: Consuella Lose, MD;  Location: [place];  Service: Neurosurgery;  Laterality: Left;  Left stereotactic craniotomy for tumor resection  ENDARTERECTOMY Right 08/21/2013  Procedure: RIGHT CAROTID ANEURYSM RESECTION;  Surgeon: Rosetta Posner, MD;  Location: [place];  Service: Vascular;  Laterality: Right;  ESOPHAGEAL DILATION  02/24/2018  Procedure: ESOPHAGEAL DILATION;  Surgeon: Danie Binder, MD;  [facility];  Service: Endoscopy;;  ESOPHAGOGASTRODUODENOSCOPY  11/13/2009  w/dilation to 95mm, gastric ulceration (H.Pylori) s/p treatment  ESOPHAGOGASTRODUODENOSCOPY  11/21/2009  distal esophageal web, gastritis  ESOPHAGOGASTRODUODENOSCOPY  03/14/12  LFY:BOFBPZWCH in the distal esophagus/Mild gastritis/small HH. + H.pylori, prescribed Pylera. Finished treatment.   ESOPHAGOGASTRODUODENOSCOPY N/A 12/13/2017  Procedure: ESOPHAGOGASTRODUODENOSCOPY (EGD);  Surgeon: Danie Binder, MD;  [facility];  Service: Endoscopy;  Laterality: N/A;  8:30am  ESOPHAGOGASTRODUODENOSCOPY N/A 02/24/2018  Procedure: ESOPHAGOGASTRODUODENOSCOPY (EGD);  Surgeon: Danie Binder, MD;  [facility];  Service: Endoscopy;  Laterality: N/A;  11:00am  fatty tumor removal from lt groin    FRACTURE SURGERY Left   hand and left ring finger  GIVENS CAPSULE STUDY N/A 12/26/2017  Procedure: GIVENS CAPSULE STUDY;  Surgeon: Danie Binder, MD;  [facility];  Service: Endoscopy;  Laterality: N/A;  7:30am  GIVENS CAPSULE STUDY N/A 02/24/2018  Procedure: GIVENS CAPSULE STUDY;   Surgeon: Danie Binder, MD;  [facility];  Service: Endoscopy;  Laterality: N/A;  KNEE SURGERY    patella tendon repair june 2019 [facility]  LUNG CANCER SURGERY  12/2010  Left VATS, minithoracotomy, LLL superior segmentectomy  ORIF PATELLA Right 03/18/2018  Procedure: OPEN REDUCTION INTERNAL (ORIF) FIXATION RIGHT PATELLA;  Surgeon: Carole Civil, MD;  [facility];  Service: Orthopedics;  Laterality: Right;  ORIF WRIST FRACTURE Left 04/09/2014  Procedure: OPEN REDUCTION INTERNAL FIXATION (ORIF) WRIST FRACTURE;  Surgeon: Renette Butters, MD;  Location: [place];  Service: Orthopedics;  Laterality: Left;  PERCUTANEOUS PINNING Left 04/09/2014  Procedure: PERCUTANEOUS PINNING EXTREMITY;  Surgeon: Renette Butters, MD;  Location: [place];  Service: Orthopedics;  Laterality: Left;  SAVORY DILATION N/A 12/13/2017  Procedure: SAVORY DILATION;  Surgeon: Danie Binder, MD;  [facility];  Service: Endoscopy;  Laterality: N/A;  TUBAL LIGATION    vocal cord surgery  02/06/2011  laryngoscopy with bilateral vocal cord Radiesse injection for vocal cord paralysis  YAG LASER APPLICATION Left 40/05/6760  Procedure: YAG LASER APPLICATION;  Surgeon: Rutherford Guys, MD;  [facility];  Service: Ophthalmology;  Laterality: Left; HPI: [name] is a 78 y.o. 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  She was also trialed on a prednisone taper in the outpatient setting with no relief of her  symptoms.  She denies any fevers or chills at home and does not have a significant cough or sputum production.  It appears that she was recently seen by an ENT physician Dr. Redmond Baseman at [facility] for pharyngeal dysphagia and is noted to have recurrent swallowing issues from her recent stroke and brain surgery.  She is recommended to have a modified barium swallow for further evaluation.  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 Patient was admitted with mild acute on chronic hypoxemic respiratory failure in the setting of emphysema.  There may be components of ongoing silent aspiration with pneumonitis.  CT angiogram ordered and currently pending to rule out PE and to have better imaging.  Pulmonology following.  Procalcitonin low, and therefore antibiotics currently discontinued.  She appears to be doing better overall today. BSE ordered.  Subjective: "I get choked occasionally." Assessment / Plan / Recommendation CHL IP CLINICAL IMPRESSIONS 05/27/2019 Clinical Impression Pt presents with normal oropharyngeal swallow with timely for age swallow initiation, trace flash penetration of thins (underepiglottic coating only), and no significant residuals post swallow during presentations of thin via tsp, cup, straw, puree, regular textures, and pill. Pt noted to have a prominent cricopharyngeus that did not appear to negatively impact swallow function, however if Pt reports dysphagia, consider esophageal assessment. Recommend regular textures and thin liquids with standard aspiration and reflux precautions. No further SLP services indicated at this time. SLP Visit Diagnosis Dysphagia, oropharyngeal phase (R13.12) Attention and concentration deficit following  -- Frontal lobe and executive function deficit following -- Impact on safety and function No limitations   CHL IP TREATMENT RECOMMENDATION 05/27/2019 Treatment Recommendations No treatment recommended at  this time   Prognosis 05/27/2019 Prognosis for Safe Diet Advancement Good Barriers to Reach Goals -- Barriers/Prognosis Comment -- CHL IP DIET RECOMMENDATION 05/27/2019 SLP Diet Recommendations Regular solids;Thin liquid Liquid Administration via Cup;Straw Medication Administration Whole meds with liquid Compensations Slow rate;Small sips/bites Postural Changes Remain semi-upright after after feeds/meals (Comment);Seated upright at 90 degrees   CHL IP OTHER RECOMMENDATIONS 05/27/2019 Recommended Consults Consider esophageal assessment Oral Care Recommendations Oral care BID Other Recommendations Clarify dietary restrictions   CHL IP FOLLOW UP RECOMMENDATIONS 05/27/2019 Follow up Recommendations None   CHL IP FREQUENCY AND DURATION 05/26/2019 Speech Therapy Frequency (ACUTE ONLY) min 2x/week Treatment Duration 1 week      CHL IP ORAL PHASE 05/27/2019 Oral Phase WFL Oral - Pudding Teaspoon -- Oral - Pudding Cup -- Oral - Honey Teaspoon -- Oral - Honey Cup -- Oral - Nectar Teaspoon -- Oral - Nectar Cup -- Oral - Nectar Straw -- Oral - Thin Teaspoon -- Oral - Thin Cup -- Oral - Thin Straw -- Oral - Puree -- Oral - Mech Soft -- Oral - Regular -- Oral - Multi-Consistency -- Oral - Pill -- Oral Phase - Comment --  CHL IP PHARYNGEAL PHASE 05/27/2019 Pharyngeal Phase Impaired Pharyngeal- Pudding Teaspoon -- Pharyngeal -- Pharyngeal- Pudding Cup -- Pharyngeal -- Pharyngeal- Honey Teaspoon -- Pharyngeal -- Pharyngeal- Honey Cup -- Pharyngeal -- Pharyngeal- Nectar Teaspoon -- Pharyngeal -- Pharyngeal- Nectar Cup -- Pharyngeal -- Pharyngeal- Nectar Straw -- Pharyngeal -- Pharyngeal- Thin Teaspoon Delayed swallow initiation-vallecula;Penetration/Aspiration before swallow Pharyngeal Material does not enter airway;Material enters airway,  remains ABOVE vocal cords then ejected out Pharyngeal- Thin Cup Delayed swallow initiation-vallecula;Penetration/Aspiration before swallow Pharyngeal Material does not enter airway;Material enters airway, remains ABOVE vocal cords then ejected out Pharyngeal- Thin Straw WFL Pharyngeal -- Pharyngeal- Puree WFL Pharyngeal -- Pharyngeal- Mechanical Soft -- Pharyngeal -- Pharyngeal- Regular WFL Pharyngeal -- Pharyngeal- Multi-consistency -- Pharyngeal -- Pharyngeal- Pill WFL Pharyngeal -- Pharyngeal Comment --  CHL IP CERVICAL ESOPHAGEAL PHASE 05/27/2019 Cervical Esophageal Phase Impaired Pudding Teaspoon -- Pudding Cup -- Honey Teaspoon -- Honey Cup -- Nectar Teaspoon -- Nectar Cup -- Nectar Straw -- Thin Teaspoon -- Thin Cup -- Thin Straw -- Puree Prominent cricopharyngeal segment Mechanical Soft -- Regular -- Multi-consistency -- Pill -- Cervical Esophageal Comment -- Thank you, Genene Churn, [place] 05/27/2019, 12:54 PM                Subjective: Pt says that overall she is feeling much better.  She has some pain in right rib cage with deep inspiration or coughing.  She has been coughing but nonproductive.    Discharge Exam: Vitals:   05/28/19 0500 05/28/19 0805  BP: 107/77   Pulse: 74   Resp: (!) 22   Temp: 97.6 F (36.4 C)   SpO2: 95% 94%   Vitals:   05/27/19 1917 05/27/19 2103 05/28/19 0500 05/28/19 0805  BP:  102/67 107/77   Pulse:  84 74   Resp:  (!) 24 (!) 22   Temp:  98.1 F (36.7 C) 97.6 F (36.4 C)   TempSrc:  Oral Oral   SpO2: 95% 93% 95% 94%  Weight:      Height:       General exam: emaciated, chronically ill appearing female, NAD.  Respiratory system: faint basilar wheezes. No increased work of breathing. Cardiovascular system: S1 & S2 heard. No JVD, murmurs, gallops, clicks or pedal edema. Gastrointestinal system: Abdomen is nondistended, soft and nontender. Normal bowel sounds heard. Central  nervous system: Alert and oriented. No focal  neurological deficits. Extremities: no CCE.   The results of significant diagnostics from this hospitalization (including imaging, microbiology, ancillary and laboratory) are listed below for reference.     Microbiology: Recent Results (from the past 240 hour(s))  SARS Coronavirus 2 [facility] order, Performed in [facility] hospital lab) Nasopharyngeal Nasopharyngeal Swab     Status: None   Collection Time: 05/25/19 10:43 AM   Specimen: Nasopharyngeal Swab  Result Value Ref Range Status   SARS Coronavirus 2 NEGATIVE NEGATIVE Final    Comment: (NOTE) If result is NEGATIVE SARS-CoV-2 target nucleic acids are NOT DETECTED. The SARS-CoV-2 RNA is generally detectable in upper and lower  respiratory specimens during the acute phase of infection. The lowest  concentration of SARS-CoV-2 viral copies this assay can detect is 250  copies / mL. A negative result does not preclude SARS-CoV-2 infection  and should not be used as the sole basis for treatment or other  patient management decisions.  A negative result may occur with  improper specimen collection / handling, submission of specimen other  than nasopharyngeal swab, presence of viral mutation(s) within the  areas targeted by this assay, and inadequate number of viral copies  (<250 copies / mL). A negative result must be combined with clinical  observations, patient history, and epidemiological information. If result is POSITIVE SARS-CoV-2 target nucleic acids are DETECTED. The SARS-CoV-2 RNA is generally detectable in upper and lower  respiratory specimens dur ing the acute phase of infection.  Positive  results are indicative of active infection with SARS-CoV-2.  Clinical  correlation with patient history and other diagnostic information is  necessary to determine patient infection status.  Positive results do  not rule out bacterial infection or co-infection with other viruses. If result is PRESUMPTIVE POSTIVE SARS-CoV-2 nucleic  acids MAY BE PRESENT.   A presumptive positive result was obtained on the submitted specimen  and confirmed on repeat testing.  While 2019 novel coronavirus  (SARS-CoV-2) nucleic acids may be present in the submitted sample  additional confirmatory testing may be necessary for epidemiological  and / or clinical management purposes  to differentiate between  SARS-CoV-2 and other Sarbecovirus currently known to infect humans.  If clinically indicated additional testing with an alternate test  methodology [phone]) is advised. The SARS-CoV-2 RNA is generally  detectable in upper and lower respiratory sp ecimens during the acute  phase of infection. The expected result is Negative. Fact Sheet for Patients:  StrictlyIdeas.no Fact Sheet for Healthcare Providers: BankingDealers.co.za This test is not yet approved or cleared by the [place] FDA and has been authorized for detection and/or diagnosis of SARS-CoV-2 by FDA under an Emergency Use Authorization (EUA).  This EUA will remain in effect (meaning this test can be used) for the duration of the COVID-19 declaration under Section 564(b)(1) of the Act, 21 U.S.C. section 360bbb-3(b)(1), unless the authorization is terminated or revoked sooner. Performed at [facility], [address], [place], [place] [address]      Labs: BNP (last 3 results) Recent Labs    05/25/19 1135  BNP 00.8   Basic Metabolic Panel: Recent Labs  Lab 05/25/19 1135 05/26/19 0434 05/27/19 0509  NA 134* 133* 134*  K 3.9 3.8 4.0  CL 94* 98 97*  CO2 27 23 26   GLUCOSE 71 108* 111*  BUN 16 18 14   CREATININE 0.44 0.48 0.40*  CALCIUM 9.6 9.4 9.7   Liver Function Tests: Recent Labs  Lab 05/25/19  1135  AST 16  ALT 13  ALKPHOS 108  BILITOT 0.8  PROT 7.2  ALBUMIN 4.2   No results for input(s): LIPASE, AMYLASE in the last 168 hours. No results for input(s): AMMONIA in the last 168 hours. CBC: Recent  Labs  Lab 05/25/19 1135 05/26/19 0434  WBC 6.1 5.8  NEUTROABS 4.9  --   HGB 12.7 12.2  HCT 40.1 38.2  MCV 90.7 91.2  PLT 380 358   Cardiac Enzymes: No results for input(s): CKTOTAL, CKMB, CKMBINDEX, TROPONINI in the last 168 hours. BNP: Invalid input(s): POCBNP CBG: No results for input(s): GLUCAP in the last 168 hours. D-Dimer Recent Labs    05/25/19 1423  DDIMER 1.90*   Hgb A1c No results for input(s): HGBA1C in the last 72 hours. Lipid Profile No results for input(s): CHOL, HDL, LDLCALC, TRIG, CHOLHDL, LDLDIRECT in the last 72 hours. Thyroid function studies No results for input(s): TSH, T4TOTAL, T3FREE, THYROIDAB in the last 72 hours.  Invalid input(s): FREET3 Anemia work up No results for input(s): VITAMINB12, FOLATE, FERRITIN, TIBC, IRON, RETICCTPCT in the last 72 hours. Urinalysis    Component Value Date/Time   COLORURINE YELLOW 03/06/2019 0332   APPEARANCEUR CLEAR 03/06/2019 0332   LABSPEC 1.012 03/06/2019 0332   PHURINE 6.0 03/06/2019 0332   GLUCOSEU NEGATIVE 03/06/2019 0332   HGBUR NEGATIVE 03/06/2019 0332   BILIRUBINUR NEGATIVE 03/06/2019 0332   KETONESUR NEGATIVE 03/06/2019 0332   PROTEINUR NEGATIVE 03/06/2019 0332   UROBILINOGEN 0.2 06/01/2015 1145   NITRITE NEGATIVE 03/06/2019 0332   LEUKOCYTESUR NEGATIVE 03/06/2019 0332   Sepsis Labs Invalid input(s): PROCALCITONIN,  WBC,  LACTICIDVEN Microbiology Recent Results (from the past 240 hour(s))  SARS Coronavirus 2 [facility] order, Performed in [facility] hospital lab) Nasopharyngeal Nasopharyngeal Swab     Status: None   Collection Time: 05/25/19 10:43 AM   Specimen: Nasopharyngeal Swab  Result Value Ref Range Status   SARS Coronavirus 2 NEGATIVE NEGATIVE Final    Comment: (NOTE) If result is NEGATIVE SARS-CoV-2 target nucleic acids are NOT DETECTED. The SARS-CoV-2 RNA is generally detectable in upper and lower  respiratory specimens during the acute phase of infection. The lowest   concentration of SARS-CoV-2 viral copies this assay can detect is 250  copies / mL. A negative result does not preclude SARS-CoV-2 infection  and should not be used as the sole basis for treatment or other  patient management decisions.  A negative result may occur with  improper specimen collection / handling, submission of specimen other  than nasopharyngeal swab, presence of viral mutation(s) within the  areas targeted by this assay, and inadequate number of viral copies  (<250 copies / mL). A negative result must be combined with clinical  observations, patient history, and epidemiological information. If result is POSITIVE SARS-CoV-2 target nucleic acids are DETECTED. The SARS-CoV-2 RNA is generally detectable in upper and lower  respiratory specimens dur ing the acute phase of infection.  Positive  results are indicative of active infection with SARS-CoV-2.  Clinical  correlation with patient history and other diagnostic information is  necessary to determine patient infection status.  Positive results do  not rule out bacterial infection or co-infection with other viruses. If result is PRESUMPTIVE POSTIVE SARS-CoV-2 nucleic acids MAY BE PRESENT.   A presumptive positive result was obtained on the submitted specimen  and confirmed on repeat testing.  While 2019 novel coronavirus  (SARS-CoV-2) nucleic acids may be present in the submitted sample  additional confirmatory testing may be  necessary for epidemiological  and / or clinical management purposes  to differentiate between  SARS-CoV-2 and other Sarbecovirus currently known to infect humans.  If clinically indicated additional testing with an alternate test  methodology [phone]) is advised. The SARS-CoV-2 RNA is generally  detectable in upper and lower respiratory sp ecimens during the acute  phase of infection. The expected result is Negative. Fact Sheet for Patients:  StrictlyIdeas.no Fact Sheet  for Healthcare Providers: BankingDealers.co.za This test is not yet approved or cleared by the [place] FDA and has been authorized for detection and/or diagnosis of SARS-CoV-2 by FDA under an Emergency Use Authorization (EUA).  This EUA will remain in effect (meaning this test can be used) for the duration of the COVID-19 declaration under Section 564(b)(1) of the Act, 21 U.S.C. section 360bbb-3(b)(1), unless the authorization is terminated or revoked sooner. Performed at [facility], [address] [place], [place] [address]    Time coordinating discharge: 34 mins   SIGNED:  Irwin Brakeman, MD  [facility] 05/28/2019, 10:57 AM How to contact the [facility] Attending or Consulting provider [place] or covering provider during after hours [place], for this patient?  Check the care team in [facility] and look for a) attending/consulting [facility] provider listed and b) the [facility] team listed Log into [email or web address] and use [place]'s universal password to access. If you do not have the password, please contact the hospital operator. Locate the [facility] provider you are looking for under [facility] and page to a number that you can be directly reached. If you still have difficulty reaching the provider, please page the [facility] (Director on Call) for the Hospitalists listed on amion for assistance.

## 2019-06-03 ENCOUNTER — Telehealth: Payer: Self-pay

## 2019-06-03 DIAGNOSIS — J449 Chronic obstructive pulmonary disease, unspecified: Secondary | ICD-10-CM | POA: Diagnosis not present

## 2019-06-03 DIAGNOSIS — I5189 Other ill-defined heart diseases: Secondary | ICD-10-CM | POA: Diagnosis not present

## 2019-06-03 DIAGNOSIS — C3490 Malignant neoplasm of unspecified part of unspecified bronchus or lung: Secondary | ICD-10-CM | POA: Diagnosis not present

## 2019-06-03 DIAGNOSIS — J9611 Chronic respiratory failure with hypoxia: Secondary | ICD-10-CM | POA: Diagnosis not present

## 2019-06-03 NOTE — Telephone Encounter (Signed)
Linda from [facility] called and wanted [employer] for pt along with OT. PT. And RN. Pt thought she had [facility] before and I called and they had never seen pt before. I called Vaughan Basta back and left a message to call me back regarding this matter and the reason why pt needed these services.

## 2019-06-04 ENCOUNTER — Emergency Department (HOSPITAL_COMMUNITY): Payer: PPO

## 2019-06-04 ENCOUNTER — Emergency Department (HOSPITAL_COMMUNITY)
Admission: EM | Admit: 2019-06-04 | Discharge: 2019-06-04 | Disposition: A | Discharge status: Home / Self Care | Payer: PPO | Attending: Emergency Medicine | Admitting: Emergency Medicine

## 2019-06-04 ENCOUNTER — Other Ambulatory Visit: Payer: Self-pay

## 2019-06-04 ENCOUNTER — Telehealth: Payer: Self-pay | Admitting: *Deleted

## 2019-06-04 ENCOUNTER — Encounter (HOSPITAL_COMMUNITY): Payer: Self-pay | Admitting: Emergency Medicine

## 2019-06-04 DIAGNOSIS — E039 Hypothyroidism, unspecified: Secondary | ICD-10-CM | POA: Diagnosis not present

## 2019-06-04 DIAGNOSIS — C7931 Secondary malignant neoplasm of brain: Secondary | ICD-10-CM | POA: Insufficient documentation

## 2019-06-04 DIAGNOSIS — J449 Chronic obstructive pulmonary disease, unspecified: Secondary | ICD-10-CM | POA: Insufficient documentation

## 2019-06-04 DIAGNOSIS — K5730 Diverticulosis of large intestine without perforation or abscess without bleeding: Secondary | ICD-10-CM | POA: Diagnosis not present

## 2019-06-04 DIAGNOSIS — Z87891 Personal history of nicotine dependence: Secondary | ICD-10-CM | POA: Insufficient documentation

## 2019-06-04 DIAGNOSIS — I517 Cardiomegaly: Secondary | ICD-10-CM | POA: Diagnosis not present

## 2019-06-04 DIAGNOSIS — Z79899 Other long term (current) drug therapy: Secondary | ICD-10-CM | POA: Diagnosis not present

## 2019-06-04 DIAGNOSIS — Z85118 Personal history of other malignant neoplasm of bronchus and lung: Secondary | ICD-10-CM | POA: Diagnosis not present

## 2019-06-04 DIAGNOSIS — K5900 Constipation, unspecified: Secondary | ICD-10-CM | POA: Insufficient documentation

## 2019-06-04 DIAGNOSIS — I10 Essential (primary) hypertension: Secondary | ICD-10-CM | POA: Insufficient documentation

## 2019-06-04 DIAGNOSIS — I714 Abdominal aortic aneurysm, without rupture: Secondary | ICD-10-CM | POA: Insufficient documentation

## 2019-06-04 DIAGNOSIS — R109 Unspecified abdominal pain: Secondary | ICD-10-CM | POA: Diagnosis not present

## 2019-06-04 LAB — CBC WITH DIFFERENTIAL/PLATELET
Abs Immature Granulocytes: 0.07 10*3/uL (ref 0.00–0.07)
Basophils Absolute: 0 10*3/uL (ref 0.0–0.1)
Basophils Relative: 1 %
Eosinophils Absolute: 0.1 10*3/uL (ref 0.0–0.5)
Eosinophils Relative: 1 %
HCT: 40.4 % (ref 36.0–46.0)
Hemoglobin: 13 g/dL (ref 12.0–15.0)
Immature Granulocytes: 1 %
Lymphocytes Relative: 15 %
Lymphs Abs: 1.2 10*3/uL (ref 0.7–4.0)
MCH: 29.3 pg (ref 26.0–34.0)
MCHC: 32.2 g/dL (ref 30.0–36.0)
MCV: 91 fL (ref 80.0–100.0)
Monocytes Absolute: 0.6 10*3/uL (ref 0.1–1.0)
Monocytes Relative: 8 %
Neutro Abs: 6.2 10*3/uL (ref 1.7–7.7)
Neutrophils Relative %: 74 %
Platelets: 443 10*3/uL — ABNORMAL HIGH (ref 150–400)
RBC: 4.44 MIL/uL (ref 3.87–5.11)
RDW: 13.2 % (ref 11.5–15.5)
WBC: 8.2 10*3/uL (ref 4.0–10.5)
nRBC: 0 % (ref 0.0–0.2)

## 2019-06-04 LAB — COMPREHENSIVE METABOLIC PANEL
ALT: 14 U/L (ref 0–44)
AST: 13 U/L — ABNORMAL LOW (ref 15–41)
Albumin: 4.4 g/dL (ref 3.5–5.0)
Alkaline Phosphatase: 127 U/L — ABNORMAL HIGH (ref 38–126)
Anion gap: 11 (ref 5–15)
BUN: 12 mg/dL (ref 8–23)
CO2: 24 mmol/L (ref 22–32)
Calcium: 9.5 mg/dL (ref 8.9–10.3)
Chloride: 98 mmol/L (ref 98–111)
Creatinine, Ser: 0.46 mg/dL (ref 0.44–1.00)
GFR calc Af Amer: >60 mL/min (ref >60)
GFR calc non Af Amer: >60 mL/min (ref >60)
Glucose, Bld: 102 mg/dL — ABNORMAL HIGH (ref 70–99)
Potassium: 3.9 mmol/L (ref 3.5–5.1)
Sodium: 133 mmol/L — ABNORMAL LOW (ref 135–145)
Total Bilirubin: 0.6 mg/dL (ref 0.3–1.2)
Total Protein: 7.2 g/dL (ref 6.5–8.1)

## 2019-06-04 LAB — URINALYSIS, ROUTINE W REFLEX MICROSCOPIC
Bilirubin Urine: NEGATIVE
Glucose, UA: NEGATIVE mg/dL
Hgb urine dipstick: NEGATIVE
Ketones, ur: 20 mg/dL — AB
Leukocytes,Ua: NEGATIVE
Nitrite: NEGATIVE
Protein, ur: NEGATIVE mg/dL
Specific Gravity, Urine: 1.006 (ref 1.005–1.030)
pH: 6 (ref 5.0–8.0)

## 2019-06-04 LAB — LIPASE, BLOOD: Lipase: 24 U/L (ref 11–51)

## 2019-06-04 MED ORDER — IOHEXOL 300 MG/ML  SOLN
75.0000 mL | Freq: Once | INTRAMUSCULAR | Status: AC | PRN
  Administered 2019-06-04: 75 mL via INTRAVENOUS

## 2019-06-04 MED ORDER — HEPARIN BOLUS VIA INFUSION: 3100.0000 [IU] | Freq: Once | INTRAVENOUS | Status: DC

## 2019-06-04 MED ORDER — HEPARIN (PORCINE) 25000 UT/250ML-% IV SOLN: 600.0000 [IU]/h | INTRAVENOUS | Status: DC

## 2019-06-04 MED ORDER — HYDROCORTISONE NA SUCCINATE PF 100 MG IJ SOLR
200.0000 mg | Freq: Once | INTRAMUSCULAR | Status: AC
  Administered 2019-06-04: 200 mg via INTRAVENOUS
  Filled 2019-06-04: qty 4

## 2019-06-04 MED ORDER — DIPHENHYDRAMINE HCL 50 MG/ML IJ SOLN
50.0000 mg | Freq: Once | INTRAMUSCULAR | Status: AC
  Administered 2019-06-04: 50 mg via INTRAVENOUS
  Filled 2019-06-04: qty 1

## 2019-06-04 MED ORDER — HYDROCORTISONE NA SUCCINATE PF 250 MG IJ SOLR
200.0000 mg | Freq: Once | INTRAMUSCULAR | Status: DC
  Filled 2019-06-04: qty 200

## 2019-06-04 NOTE — Discharge Instructions (Addendum)
Your CT today was within normal limits, a report has been provided for your records.  You may continue taking MiraLAX, you may do so twice a day until complete bowel movement.  Please follow-up with your primary care physician as needed.

## 2019-06-04 NOTE — ED Notes (Signed)
Pt. Assisted in the use of a bedpan.

## 2019-06-04 NOTE — Progress Notes (Signed)
ANTICOAGULATION CONSULT NOTE - Initial Consult  Pharmacy Consult for heparin Indication: chest pain/ACS  Allergies  Allergen Reactions  . Ciprofloxacin Hcl Hives  . Sulfonamide Derivatives Hives and Rash  . Iohexol Other (See Comments)    UNSPECIFIED REACTION  Desc: Per [facility], pt is allergic to IV contrast, no type of reaction was available. Pt cannot remember but first reacted around 15 yrs ago from an IVP. Notes were date from 2009 at urology center., [place] Date: [id number]   . Prozac [Fluoxetine Hcl] Rash    Patient Measurements:    Vital Signs: Temp: 97.9 F (36.6 C) (08/27 1147) Temp Source: Oral (08/27 1147) BP: 106/78 (08/27 2100) Pulse Rate: 88 (08/27 2100)  Labs: Recent Labs    06/04/19 1228  HGB 13.0  HCT 40.4  PLT 443*  CREATININE 0.46    Estimated Creatinine Clearance: 48 mL/min (by C-G formula based on SCr of 0.46 mg/dL).   Medical History: Past Medical History:  Diagnosis Date  . 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 Allergic rhinitis   . 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 Anxiety disorder   . Aortic aneurysm ([place])   . Brain metastasis ([place])    lung cancer s/p resection 7/20  . Complication of anesthesia 2011   bloodpressure dropped during colonoscopy, not problems since  . Compression fracture   . COPD (chronic obstructive pulmonary disease) ([place])   . Depression   . Diastolic dysfunction   . Diverticulosis   . Dysphagia   . Emphysema lung ([place]) 11/08/2014  . Headache   . Hiatal hernia   . History of kidney stones   . Hypothyroidism   . IBS (irritable bowel syndrome)   . Iron deficiency anemia due to chronic blood loss 12/05/2016  . On home O2 12/15/2013   chronic hypoxia  . Pneumonia   . PUD (peptic ulcer disease)    2yrs  . Shortness of breath    with exertion  . SIADH (syndrome of inappropriate ADH production) ([place])   . Trigeminal neuralgia     Medications:   (Not in a hospital admission)   Assessment: Pharmacy consulted to dose heparin in patient with ACS/STEMI.  Patient not on anticoagulation prior to admission.  Goal of Therapy:  Heparin level 0.3-0.7 units/ml Monitor platelets by anticoagulation protocol: Yes   Plan:  Give 3100 units bolus x 1 Start heparin infusion at 600 units units/hr Check anti-Xa level in 8 hours and daily while on heparin Continue to monitor H&H and platelets  Revonda Standard Isaiah Torok 06/04/2019,9:15 PM

## 2019-06-04 NOTE — ED Notes (Signed)
Patient transported back from CT 

## 2019-06-04 NOTE — ED Provider Notes (Signed)
[facility] EMERGENCY DEPARTMENT Provider Note   CSN: [id number] Arrival date & time: 06/04/19  1134     History   Chief Complaint Chief Complaint  Patient presents with   Constipation    HPI [name] is a 78 y.o. female.     78 y.o female with a PMH of Adenocarcinoma of the lung, COPD on 2-3L of oxygen at baseline presents to the ED with a chief complaint of constipation x 2 weeks. Patient reports she was recently hospitalized for hypoxia, she spent 1 week in the hospital and did not have any bowel movements during her stay. She was given IV steroids while in the hospital along with 5 days outpatient course of steroids which she recetntly completed. She reports being discharged home and having a decrease in intake.She has tried Mg Citrate, enema x 2 and gas x with some mild hard stool small in size. She is currently passing gas. She denies any blood on her small stool. She now endorses BL abdominal pain mostly on her oblique area, states feeling a pressure which makes her double over, left > right side. She denies any nausea, vomiting, or fevers. She does have an extensive PSH of the abdomen including cholecystectomy and C-section.   The history is provided by the patient and medical records.  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[place])    corrected by surgery 08/21/13   Anxiety disorder    Aortic aneurysm ([place])    Brain metastasis ([place])    lung cancer s/p resection 2/20   Complication of anesthesia 2011   bloodpressure dropped during colonoscopy, not problems since   Compression fracture    COPD (chronic obstructive pulmonary disease) (HCC)    Depression    Diastolic dysfunction    Diverticulosis    Dysphagia    Emphysema lung ([place]) 11/08/2014     Headache    Hiatal hernia    History of kidney stones    Hypothyroidism    IBS (irritable bowel syndrome)    Iron deficiency anemia due to chronic blood loss 12/05/2016   On home O2 12/15/2013   chronic hypoxia   Pneumonia    PUD (peptic ulcer disease)    46yrs   Shortness of breath    with exertion   SIADH (syndrome of inappropriate ADH production) ([place])    Trigeminal neuralgia     Patient Active Problem List   Diagnosis Date Noted   Acute hypoxemic respiratory failure ([place]) 05/25/2019   Brain metastasis ([place])    Brain tumor ([place]) 03/18/2019   Essential hypertension 03/12/2019   Brain metastases ([place]) 03/06/2019   Cytotoxic brain edema ([place]) 03/04/2019   ICH (intracerebral hemorrhage) ([place]) - L parietal lobe 03/03/2019   Osteoporosis 05/16/2018   S/P ORIF (open reduction internal fixation) fracture right patella 03/18/18 03/26/2018   Cellulitis, wound, post-operative 03/22/2018   Fracture of right patella    Normocytic anemia    Esophageal dysphagia 11/27/2017   Heme positive stool 11/27/2017   Iron deficiency anemia 12/05/2016   SIADH (syndrome of inappropriate ADH production) ([place])    Polypharmacy 09/14/2016   Muscle weakness (generalized)    Syncope 09/10/2016   Rib pain on left side    Hypersomnia 02/16/2016   Acute  bronchitis 02/16/2016   Exertional dyspnea 12/20/2015   HLD (hyperlipidemia) 11/23/2014   Change in bowel habits 11/10/2014   Abdominal pain, left lower quadrant 11/10/2014   Emphysema lung ([place]) 21/19/4174   Helicobacter pylori gastritis 07/30/2014   Acute on chronic respiratory failure with hypoxia (HCC) 04/10/2014   Wrist fracture 04/09/2014   Occlusion and stenosis of carotid artery without mention of cerebral infarction 03/16/2014   T12 burst fracture ([place]) 01/13/2014   Constipation 12/14/2013   Acute respiratory failure with hypoxia ([place]) 12/14/2013   COPD (chronic obstructive pulmonary disease)  ([place]) 12/14/2013   Compression fracture of thoracic vertebra (HCC) 12/14/2013   Hypoxia 12/13/2013   Compression fracture 05/21/4817   Diastolic dysfunction    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[place]) 08/11/2013   AAA (abdominal aortic aneurysm) without rupture ([place]) 02/26/2012   Dyspepsia 02/11/2012   Aneurysm ([place]) 02/11/2012   Trigeminal neuralgia    Epigastric pain 02/21/2011   Dysphagia 02/21/2011   Adenocarcinoma of lung ([place]) 56/31/4970   HELICOBACTER PYLORI GASTRITIS, HX OF 12/30/2009   CONTRAST DYE ALLERGY 12/30/2009    Past Surgical History:  Procedure Laterality Date   ABDOMINAL HYSTERECTOMY     APPLICATION OF CRANIAL NAVIGATION N/A 03/18/2019   Procedure: APPLICATION OF CRANIAL NAVIGATION;  Surgeon: Consuella Lose, MD;  Location: [place];  Service: Neurosurgery;  Laterality: N/A;   BACK SURGERY  January 13, 2014   CATARACT EXTRACTION     CATARACT EXTRACTION W/PHACO  09/02/2012   Procedure: CATARACT EXTRACTION PHACO AND INTRAOCULAR LENS PLACEMENT (IOC);  Surgeon: Elta Guadeloupe T. 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 Oneida Alar: moderate diverticulosis, hemorrhoids, redundant colon. next TCS in 10-15 years.    CRANIOTOMY Left 03/18/2019   Procedure: Left stereotactic craniotomy for tumor resection;  Surgeon: Consuella Lose, MD;  Location: [place];  Service: Neurosurgery;  Laterality: Left;  Left stereotactic craniotomy for tumor resection   ENDARTERECTOMY Right 08/21/2013   Procedure: RIGHT CAROTID ANEURYSM RESECTION;  Surgeon: Rosetta Posner, MD;  Location: [place];  Service: Vascular;  Laterality: Right;   ESOPHAGEAL DILATION  02/24/2018   Procedure: ESOPHAGEAL DILATION;  Surgeon: Danie Binder, MD;  [facility];  Service: Endoscopy;;   ESOPHAGOGASTRODUODENOSCOPY  11/13/2009   w/dilation  to 25mm, gastric ulceration (H.Pylori) s/p treatment   ESOPHAGOGASTRODUODENOSCOPY  11/21/2009   distal esophageal web, gastritis   ESOPHAGOGASTRODUODENOSCOPY  03/14/12   YOV:ZCHYIFOYD in the distal esophagus/Mild gastritis/small HH. + H.pylori, prescribed Pylera. Finished treatment.    ESOPHAGOGASTRODUODENOSCOPY N/A 12/13/2017   Procedure: ESOPHAGOGASTRODUODENOSCOPY (EGD);  Surgeon: Danie Binder, MD;  [facility];  Service: Endoscopy;  Laterality: N/A;  8:30am   ESOPHAGOGASTRODUODENOSCOPY N/A 02/24/2018   Procedure: ESOPHAGOGASTRODUODENOSCOPY (EGD);  Surgeon: Danie Binder, MD;  [facility];  Service: Endoscopy;  Laterality: N/A;  11:00am   fatty tumor removal from lt groin     FRACTURE SURGERY Left    hand and left ring finger   GIVENS CAPSULE STUDY N/A 12/26/2017   Procedure: GIVENS CAPSULE STUDY;  Surgeon: Danie Binder, MD;  [facility];  Service: Endoscopy;  Laterality: N/A;  7:30am   GIVENS CAPSULE STUDY N/A 02/24/2018   Procedure: GIVENS CAPSULE STUDY;  Surgeon: Danie Binder, MD;  [facility];  Service: Endoscopy;  Laterality: N/A;  KNEE SURGERY     patella tendon repair june 2019 [place]   LUNG CANCER SURGERY  12/2010   Left VATS, minithoracotomy, LLL superior segmentectomy   ORIF PATELLA Right 03/18/2018   Procedure: OPEN REDUCTION INTERNAL (ORIF) FIXATION RIGHT PATELLA;  Surgeon: Carole Civil, MD;  Location[facility];  Service: Orthopedics;  Laterality: Right;   ORIF WRIST FRACTURE Left 04/09/2014   Procedure: OPEN REDUCTION INTERNAL FIXATION (ORIF) WRIST FRACTURE;  Surgeon: Renette Butters, MD;  Location: [place];  Service: Orthopedics;  Laterality: Left;   PERCUTANEOUS PINNING Left 04/09/2014   Procedure: PERCUTANEOUS PINNING EXTREMITY;  Surgeon: Renette Butters, MD;  Location: [place];  Service: Orthopedics;  Laterality: Left;   SAVORY DILATION N/A 12/13/2017   Procedure: SAVORY DILATION;  Surgeon: Danie Binder,  MD;  [facility];  Service: Endoscopy;  Laterality: N/A;   TUBAL LIGATION     vocal cord surgery  02/06/2011   laryngoscopy with bilateral vocal cord Radiesse injection for vocal cord paralysis   YAG LASER APPLICATION Left 02/40/9735   Procedure: YAG LASER APPLICATION;  Surgeon: Rutherford Guys, MD;  Location: [facility];  Service: Ophthalmology;  Laterality: Left;     OB History    Gravida  2   Para  2   Term  2   Preterm      AB      Living  2     SAB      TAB      Ectopic      Multiple      Live Births               Home Medications    Prior to Admission medications   Medication Sig Start Date End Date Taking? Authorizing Provider  acetaminophen (TYLENOL) 325 MG tablet Take 2 tablets (650 mg total) by mouth every 6 (six) hours. 05/28/19  Yes Johnson, Clanford L, MD  albuterol (VENTOLIN HFA) 108 (90 Base) MCG/ACT inhaler Inhale 2 puffs into the lungs every 6 (six) hours as needed for wheezing or shortness of breath. 05/14/19  Yes [name], MD  ALPRAZolam Duanne Moron) 0.5 MG tablet Take 1 tablet by mouth at bedtime.  05/20/19  Yes [provider]  CALCIUM PO Take 1 tablet by mouth daily.   Yes [provider]  citalopram (CELEXA) 40 MG tablet Take 0.5 tablets (20 mg total) by mouth at bedtime. 03/12/19  Yes Donzetta Starch, NP  denosumab (PROLIA) 60 MG/ML SOSY injection Inject 60 mg into the skin every 6 (six) months. 04/21/19  Yes [name], MD  ferrous sulfate 325 (65 FE) MG tablet Take 325 mg by mouth daily with breakfast.   Yes [provider]  fluticasone (FLONASE) 50 MCG/ACT nasal spray Place 2 sprays into both nostrils daily. 12/30/18  Yes [name], MD  levothyroxine (SYNTHROID) 50 MCG tablet Take 1 tablet (50 mcg total) by mouth daily before breakfast. 03/12/19  Yes Donzetta Starch, NP  lubiprostone (AMITIZA) 24 MCG capsule Take 24 mcg by mouth 2 (two) times daily with a meal.   Yes [provider]    MELATONIN PO Take 1 capsule by mouth daily as needed (sleep).   Yes [provider]  Multiple Vitamin (MULTIVITAMIN WITH MINERALS) TABS tablet Take 1 tablet by mouth daily.   Yes [provider]  ondansetron (ZOFRAN ODT) 4 MG disintegrating tablet Take 1 tablet (4 mg total) by mouth every 4 (four) hours as needed  for nausea or vomiting. 03/12/19  Yes Donzetta Starch, NP  pantoprazole (PROTONIX) 40 MG tablet Take 1 tablet (40 mg total) by mouth daily before breakfast. 02/05/19  Yes Annitta Needs, NP  polyethylene glycol (MIRALAX / GLYCOLAX) 17 g packet Take 17 g by mouth daily as needed for mild constipation. 05/28/19  Yes Johnson, Clanford L, MD  senna-docusate (SENOKOT-S) 8.6-50 MG tablet Take 1 tablet by mouth at bedtime as needed for mild constipation. 03/12/19  Yes Donzetta Starch, NP  simethicone (GAS-X) 80 MG chewable tablet Chew 80 mg by mouth every 6 (six) hours as needed for flatulence.   Yes [provider]  umeclidinium-vilanterol (ANORO ELLIPTA) 62.5-25 MCG/INH AEPB Inhale 1 puff into the lungs daily. 06/20/18  Yes [name], MD  levETIRAcetam (KEPPRA) 500 MG tablet Take 1 tablet (500 mg total) by mouth 2 (two) times daily. Patient not taking: Reported on 06/04/2019 05/28/19   Murlean Iba, MD  predniSONE (DELTASONE) 20 MG tablet Take 2 PO QAM x5 days Patient not taking: Reported on 06/04/2019 05/28/19   Murlean Iba, MD    Family History Family History  Problem Relation Age of Onset   Pulmonary embolism Mother    Heart attack Father    Hypertension Father    Liver cancer Sister    Cancer Sister    Cancer Brother    Cancer Daughter    Colon cancer Neg Hx     Social History Social History   Tobacco Use   Smoking status: Former Smoker    Packs/day: 0.50    Years: 20.00    Pack years: 10.00    Types: Cigarettes    Quit date: 12/21/2010    Years since quitting: 8.4   Smokeless tobacco: Never Used   Tobacco comment: smoking  cessation info given and reviewed   Substance Use Topics   Alcohol use: Yes    Alcohol/week: 0.0 standard drinks    Comment: seldom   Drug use: No     Allergies   Ciprofloxacin hcl, Sulfonamide derivatives, Iohexol, and Prozac [fluoxetine hcl]   Review of Systems Review of Systems  Constitutional: Negative for fever.  HENT: Negative for sore throat.   Eyes: Negative for redness.  Respiratory: Negative for shortness of breath.   Cardiovascular: Negative for chest pain.  Gastrointestinal: Positive for constipation. Negative for anal bleeding, diarrhea, nausea and vomiting.  Genitourinary: Positive for flank pain. Negative for hematuria, pelvic pain and urgency.  Musculoskeletal: Negative for back pain.  Skin: Negative for pallor and wound.  Neurological: Negative for headaches.     Physical Exam Updated Vital Signs BP 135/81    Pulse 91    Temp 97.9 F (36.6 C) (Oral)    Resp (!) 21    SpO2 94%   Physical Exam Vitals signs and nursing note reviewed.  Constitutional:      Appearance: She is ill-appearing.     Comments: Chronically ill appearing.   HENT:     Head: Normocephalic and atraumatic.     Nose: Nose normal.     Mouth/Throat:     Mouth: Mucous membranes are moist.  Eyes:     Extraocular Movements: Extraocular movements intact.     Pupils: Pupils are equal, round, and reactive to light.  Neck:     Musculoskeletal: Normal range of motion and neck supple.  Cardiovascular:     Rate and Rhythm: Normal rate.  Pulmonary:     Breath sounds: No stridor. Rales present.  No wheezing or rhonchi.     Comments: Lungs are diminished to auscultation.  Abdominal:     General: There is no distension.     Palpations: Abdomen is soft.     Tenderness: There is no abdominal tenderness. There is no right CVA tenderness or left CVA tenderness.     Hernia: No hernia is present.     Comments: Abdomen appears soft, no tenderness to palpation.  Bowel sounds are significantly  diminished right lower quadrant > left lower quadrant.  Skin:    General: Skin is warm.  Neurological:     Mental Status: She is alert and oriented to person, place, and time.      ED Treatments / Results  Labs (all labs ordered are listed, but only abnormal results are displayed) Labs Reviewed  CBC WITH DIFFERENTIAL/PLATELET - Abnormal; Notable for the following components:      Result Value   Platelets 443 (*)    All other components within normal limits  COMPREHENSIVE METABOLIC PANEL - Abnormal; Notable for the following components:   Sodium 133 (*)    Glucose, Bld 102 (*)    AST 13 (*)    Alkaline Phosphatase 127 (*)    All other components within normal limits  URINALYSIS, ROUTINE W REFLEX MICROSCOPIC - Abnormal; Notable for the following components:   Ketones, ur 20 (*)    All other components within normal limits  LIPASE, BLOOD    EKG None  Radiology Ct Abdomen Pelvis W Contrast  Result Date: 06/04/2019 CLINICAL DATA:  78 year old female with abdominal pain. No bowel movement over the. EXAM: CT ABDOMEN AND PELVIS WITH CONTRAST TECHNIQUE: Multidetector CT imaging of the abdomen and pelvis was performed using the standard protocol following bolus administration of intravenous contrast. CONTRAST:  47mL OMNIPAQUE IOHEXOL 300 MG/ML  SOLN COMPARISON:  CT dated 03/06/2019 FINDINGS: Lower chest: There is emphysematous changes of the lung bases. Bibasilar patchy and streaky densities most likely atelectasis/scarring. Pneumonia is not excluded. Clinical correlation is recommended. There is multi vessel coronary vascular calcification. There is a small pericardial effusion measuring approximately 1 cm in thickness anterior to the heart. This is relatively similar to prior CT. There is no intra-abdominal free air or free fluid. Hepatobiliary: The liver is unremarkable. There is mild intrahepatic biliary ductal dilatation. Cholecystectomy. No retained calcified stone noted in the central  CBD. Pancreas: Unremarkable. No pancreatic ductal dilatation or surrounding inflammatory changes. Spleen: Normal in size without focal abnormality. Adrenals/Urinary Tract: The adrenal glands are unremarkable. There is a 3 mm nonobstructing right renal interpolar calculus. There is no hydronephrosis on either side. There is symmetric enhancement and excretion of contrast by both kidneys. Visualized ureters and urinary bladder appear unremarkable. Stomach/Bowel: There is sigmoid diverticulosis without active inflammatory changes. Loose stool noted throughout the colon. There is no bowel obstruction or active inflammation. The appendix is not visualized with certainty. No inflammatory changes identified in the right lower quadrant. Vascular/Lymphatic: Advanced aortoiliac atherosclerotic disease. The abdominal aorta is tortuous. There is a 3.3 cm infrarenal abdominal aortic aneurysm similar to prior CT. There is aneurysmal dilatation of the right common iliac artery measuring up to 2.4 cm in diameter. A low attenuating band like structure in the right common iliac artery, likely represents an old dissection flap. The IVC is unremarkable. No portal venous gas. There is no adenopathy. Reproductive: Hysterectomy. No pelvic mass. Other: None Musculoskeletal: Osteopenia with scoliosis and extensive degenerative changes of the spine. Multilevel old-appearing compression fractures with lower thoracic/upper  lumbar fixation hardware. No acute osseous pathology. IMPRESSION: 1. No acute intra-abdominal or pelvic pathology. 2. Sigmoid diverticulosis. No bowel obstruction or active inflammation. 3. A 3.3 cm infrarenal abdominal aortic aneurysm and aneurysmal dilatation of the right common iliac artery. Follow-up as per recommendation of the prior CT. 4. Emphysema with bibasilar atelectasis/scarring. 5. Aortic Atherosclerosis (ICD10-I70.0) and Emphysema (ICD10-J43.9). Electronically Signed   By: Anner Crete M.D.   On: 06/04/2019  20:03   Dg Abdomen Acute W/chest  Result Date: 06/04/2019 CLINICAL DATA:  Constipation for 1 week. EXAM: DG ABDOMEN ACUTE W/ 1V CHEST COMPARISON:  06/25/2018 acute abdominal series and 04/19/2019 radiographs FINDINGS: Cardiomegaly and mild bibasilar atelectasis/scarring again noted. No pleural effusion, mass or pneumothorax. Mildly distended gas-filled RIGHT and transverse colon noted. A small amount of gas in the rectum is noted. Little stool is noted within the colon. No dilated small bowel loops are present. No suspicious calcifications noted. Thoracolumbar spinal fusion hardware again noted. IMPRESSION: Nonspecific nonobstructive bowel gas pattern with mildly distended RIGHT and transverse colon. No evidence of small bowel obstruction. Cardiomegaly with unchanged mild bibasilar atelectasis/scarring. Electronically Signed   By: Margarette Canada M.D.   On: 06/04/2019 13:50    Procedures Procedures (including critical care time)  Medications Ordered in ED Medications  diphenhydrAMINE (BENADRYL) injection 50 mg (50 mg Intravenous Given 06/04/19 1838)  hydrocortisone sodium succinate (SOLU-CORTEF) 100 MG injection 200 mg (200 mg Intravenous Given 06/04/19 1534)  iohexol (OMNIPAQUE) 300 MG/ML solution 75 mL (75 mLs Intravenous Contrast Given 06/04/19 1928)     Initial Impression / Assessment and Plan / ED Course  I have reviewed the triage vital signs and the nursing notes.  Pertinent labs & imaging results that were available during my care of the patient were reviewed by me and considered in my medical decision making (see chart for details).      Patient with a past medical history of adenocarcinoma of the lung, COPD, CVA presents to the ED with complaints of constipation x1 week.  Reports she was recently hospitalized for hypoxia, according to her medical record she received IV steroids x1 week along with discharged with a 5-day course of steroids which she has now completed.  Reports she has not  had significant bowel movement in over 1 week, has tried over-the-counter therapy such as bottle of magnesium citrate, enemas x2, Gas-X without improvement.  She is currently passing gas.  Today she reports bilateral flank pain right greater than left.  Reports she tried to self disimpact without improvement.  During primary evaluation patient is chronically ill-appearing, bowel sounds are diminished right > left.  She arrived in the ED satting at 90%, she is currently on 3 L of oxygen which is patient's baseline.  She does not report any shortness of breath today, no chest pain.  Normotensive and afebrile.  Will check labs, obtain x-ray along with further evaluate patient for her condition.   CMP showed slight hypo-natremia, rest of her electrolytes are within normal limits.  Creatinine level is unremarkable.  LFTs are within normal limits.  Lipase level is normal.  CBC showed no leukocytosis, hemoglobin is within normal limits.  Slight elevation in platelets.  An x-ray of her abdomen was obtained to further evaluate her constipation. X-ray of her abdomen showed: Nonspecific nonobstructive bowel gas pattern with mildly distended  RIGHT and transverse colon. No evidence of small bowel obstruction.    Cardiomegaly with unchanged mild bibasilar atelectasis/scarring.     I have discussed case with  Dr. Laverta Baltimore, he has also seen and evaluated patient while in the ED. CT of the abdomen was ordered as patient is chronically ill, no stool burden on rectal vault during digital exam.  CT abdomen showed: 1. No acute intra-abdominal or pelvic pathology.  2. Sigmoid diverticulosis. No bowel obstruction or active  inflammation.  3. A 3.3 cm infrarenal abdominal aortic aneurysm and aneurysmal  dilatation of the right common iliac artery. Follow-up as per  recommendation of the prior CT.  4. Emphysema with bibasilar atelectasis/scarring.  5. Aortic Atherosclerosis (ICD10-I70.0) and Emphysema (ICD10-J43.9).          These results were discussed with patient, she was provided a copy of her CT scan.  She is encouraged to follow-up with PCP along with continue taking MiraLAX but do so twice a day.  Patient does not appointment scheduled with her PCP on Monday, August 31, advised to further discuss this with her PCP.  Patient with otherwise stable vital signs stable for discharge.   Portions of this note were generated with [employer]. Dictation errors may occur despite best attempts at proofreading.  Final Clinical Impressions(s) / ED Diagnoses   Final diagnoses:  Constipation, unspecified constipation type    ED Discharge Orders    None       Janeece Fitting, Hershal Coria 06/04/19 2039    Margette Fast, MD 06/05/19 [phone]

## 2019-06-04 NOTE — ED Triage Notes (Signed)
Patient report she has been unable to have a bowel mvmt for over a week. C/O bilateral side pain. Patient has tried enemas and mag citrate without success. Patient's O2 sat in Triage 90% on 3L via .

## 2019-06-04 NOTE — ED Notes (Signed)
Pre CT med, Solu Cortef,  given at 1534.  Pre CT med, Benadyrl, due at 1834.  CT due at 1934.

## 2019-06-04 NOTE — Telephone Encounter (Signed)
Received call from patient.   Reports that she has not had BM since discharge from hospital on 05/28/2019. Reports that she has been using enemas, OTC stool softeners and laxatives with no relief. States that enema today did not return with any discoloration to water. States that she is having increased lower abd pain.   Advised to go to ER as she may have blockage. Verbalized understanding.   Report called to [facility] ER.

## 2019-06-04 NOTE — ED Notes (Signed)
Patient transported to CT 

## 2019-06-05 DIAGNOSIS — C3490 Malignant neoplasm of unspecified part of unspecified bronchus or lung: Secondary | ICD-10-CM | POA: Diagnosis not present

## 2019-06-05 DIAGNOSIS — R062 Wheezing: Secondary | ICD-10-CM | POA: Diagnosis not present

## 2019-06-05 DIAGNOSIS — R0902 Hypoxemia: Secondary | ICD-10-CM | POA: Diagnosis not present

## 2019-06-05 DIAGNOSIS — J9621 Acute and chronic respiratory failure with hypoxia: Secondary | ICD-10-CM | POA: Diagnosis not present

## 2019-06-08 ENCOUNTER — Ambulatory Visit (INDEPENDENT_AMBULATORY_CARE_PROVIDER_SITE_OTHER): Payer: PPO | Admitting: Family Medicine

## 2019-06-08 ENCOUNTER — Other Ambulatory Visit: Payer: Self-pay

## 2019-06-08 DIAGNOSIS — K5904 Chronic idiopathic constipation: Secondary | ICD-10-CM | POA: Diagnosis not present

## 2019-06-08 DIAGNOSIS — Z09 Encounter for follow-up examination after completed treatment for conditions other than malignant neoplasm: Secondary | ICD-10-CM | POA: Diagnosis not present

## 2019-06-08 MED ORDER — TIZANIDINE HCL 4 MG PO TABS: 4.0000 mg | ORAL_TABLET | Freq: Four times a day (QID) | ORAL | 0 refills | Status: DC | PRN

## 2019-06-08 MED ORDER — BISACODYL EC 5 MG PO TBEC: 5.0000 mg | DELAYED_RELEASE_TABLET | Freq: Every day | ORAL | 0 refills | Status: DC | PRN

## 2019-06-08 MED ORDER — POLYETHYLENE GLYCOL 3350 17 GM/SCOOP PO POWD: 17.0000 g | Freq: Two times a day (BID) | ORAL | 1 refills | Status: DC | PRN
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
## 2019-06-09 ENCOUNTER — Other Ambulatory Visit: Payer: Self-pay

## 2019-06-09 ENCOUNTER — Encounter (HOSPITAL_COMMUNITY): Payer: Self-pay | Admitting: Hematology

## 2019-06-09 ENCOUNTER — Inpatient Hospital Stay (HOSPITAL_COMMUNITY): Payer: PPO | Attending: Hematology | Admitting: Hematology

## 2019-06-09 ENCOUNTER — Encounter (HOSPITAL_COMMUNITY): Payer: Self-pay

## 2019-06-09 VITALS — BP 86/55 | HR 70 | Temp 97.3°F | Resp 16 | Wt 116.0 lb

## 2019-06-09 DIAGNOSIS — C3432 Malignant neoplasm of lower lobe, left bronchus or lung: Secondary | ICD-10-CM | POA: Insufficient documentation

## 2019-06-09 DIAGNOSIS — E611 Iron deficiency: Secondary | ICD-10-CM | POA: Diagnosis not present

## 2019-06-09 DIAGNOSIS — C3492 Malignant neoplasm of unspecified part of left bronchus or lung: Secondary | ICD-10-CM

## 2019-06-09 DIAGNOSIS — C7931 Secondary malignant neoplasm of brain: Secondary | ICD-10-CM | POA: Diagnosis not present

## 2019-06-09 NOTE — Assessment & Plan Note (Addendum)
1.  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 - She was being monitored with intermittent scans. - She presented with expressive aphasia to the ER on 03/03/2019 along with right-sided weakness.  She was found to have multiple brain metastasis and underwent SRS on 03/17/2019. -He also underwent stereotactic left parietal craniotomy for resection of tumor on 03/18/2019, biopsy consistent with adenocarcinoma lung primary. - I have reviewed results of the CT chest on 05/26/2019 which did not show any evidence of lung lesions. -I have also reviewed results of CT abdomen and pelvis from 06/04/2019 which did not show any evidence of malignancy in the abdomen and pelvis.  3.3 cm infrarenal abdominal aortic aneurysm and aneurysmal dilatation of the right common iliac artery. -I have recommended a PET CT scan in 3 months followed by follow-up visit. -She has follow-up brain MRI in September 2 week.  Will consider NGS panel and PDL 1 testing if there is any evidence of systemic metastasis.  2.  Iron deficiency state: -Last Feraheme infusion was on 11/21/2017. - We reviewed her CBC from 06/04/2019.  Hemoglobin is 13.0.

## 2019-06-09 NOTE — Patient Instructions (Signed)
[facility] at [facility] Discharge Instructions  You were seen today by Dr. Katragadda. He went over your recent lab results. He will see you back in 3 months for labs and follow up.   Thank you for choosing [facility] at [facility] to provide your oncology and hematology care.  To afford each patient quality time with our provider, please arrive at least 15 minutes before your scheduled appointment time.   If you have a lab appointment with the Cancer Center please come in thru the  Main Entrance and check in at the main information desk  You need to re-schedule your appointment should you arrive 10 or more minutes late.  We strive to give you quality time with our providers, and arriving late affects you and other patients whose appointments are after yours.  Also, if you no show three or more times for appointments you may be dismissed from the clinic at the providers discretion.     Again, thank you for choosing [facility].  Our hope is that these requests will decrease the amount of time that you wait before being seen by our physicians.       _____________________________________________________________  Should you have questions after your visit to  Cancer Center, please contact our office at [phone] between the hours of 8:00 a.m. and 4:30 p.m.  Voicemails left after 4:00 p.m. will not be returned until the following business day.  For prescription refill requests, have your pharmacy contact our office and allow 72 hours.    Cancer Center Support Programs:   > Cancer Support Group  2nd Tuesday of the month 1pm-2pm, Journey Room    

## 2019-06-09 NOTE — Progress Notes (Signed)
[place], [place] [address]   CLINIC:  Medical Oncology/Hematology  PCP:  [name], MD [address] [place] [address] [phone]   REASON FOR VISIT: Follow-up for metastatic lung cancer and iron deficiency state.  CURRENT THERAPY: Observation AND intermittent iron infusions   BRIEF ONCOLOGIC HISTORY:  Oncology History  Adenocarcinoma of lung ([place])  12/21/2010 Initial Diagnosis   S/P left lower lobe resection for a well-differentiated Stage IB bronchoalveolar cancer. 0/14 lymph nodes.  No perineural invasion or LVI.  negative margins.   06/05/2016 Imaging   Although the previously described ground-glass attenuation nodule in the superior segment of the right lower lobe measures slightly larger on today's examination than prior studies, this is favored to be technique related. Today's study should serve as a baseline for future followup examinations. At this time, there is no central solid component to this lesion. No new nodules are noted. 2. Stable postoperative scarring in the left lower lobe and post infectious scarring throughout the visualize lung bases, as above. 3. Areas of cylindrical bronchiectasis throughout the lung bases bilaterally    05/09/2017 Imaging   IMPRESSION: 1. Status post left lower lobe wedge resection. No findings to suggest local recurrence of disease or definite metastatic disease in the thorax. Previously noted ground-glass attenuation nodule in the superior segment of the right lower lobe appears slightly smaller than the prior study, suggesting a benign etiology. 2. Diffuse bronchial wall thickening with moderate to severe centrilobular and paraseptal emphysema; imaging findings suggestive of underlying COPD. 3. Aortic atherosclerosis, in addition to left main and left circumflex coronary artery disease. Assessment for potential risk factor modification, dietary therapy or  pharmacologic therapy may be warranted, if clinically indicated. 4. Increasing anterior pericardial fluid and/or thickening. This is unlikely to be of hemodynamic significance at this time. No associated pericardial calcification. 5. New (but non acute) compression fracture of T6 with approximately 90% loss of anterior vertebral body height, in addition to multiple other chronic compression fractures of the visualize the thoracolumbar spine.      CANCER STAGING: Cancer Staging Adenocarcinoma of lung [facility]) Staging form: Lung, AJCC 7th Edition - Clinical: Stage IB - Signed by Baird Cancer, PA on 08/01/2012    INTERVAL HISTORY:  Ms. Juncaj 78 y.o. female seen for follow-up of lung cancer.  She missed her prior visits.  She apparently presented to the ER end of May with expressive aphasia and right-sided weakness.  She was found to have multiple brain mets.  She underwent SRS to the brain mets on 03/17/2019.  She also had a biopsy of the brain mets consistent with adenocarcinoma, lung primary.  She is improving but had pneumonia recently.  She also went to the ER on 06/04/2019 with abdominal pain.  A CT scan of abdomen and pelvis was done.  Appetite is reported as 50%.  Energy levels are low.  Shortness of breath on exertion has been stable.  She is oxygen dependent.  She does report intermittent diarrhea which is stable.  She is drinking protein shakes.  She lost 10 pounds since last visit during the first part of the year.  She is slowly gaining it back.    REVIEW OF SYSTEMS:  Review of Systems  Respiratory: Positive for shortness of breath.   Gastrointestinal: Positive for diarrhea.  Psychiatric/Behavioral: The patient is nervous/anxious.   All other systems reviewed and are negative.    PAST MEDICAL/SURGICAL HISTORY:  Past  Medical History:  Diagnosis Date  . Adenocarcinoma of lung ([place]) 12/2010   left lung/surg only  . Allergic rhinitis   . 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 Anxiety disorder   . Aortic aneurysm ([place])   . Brain metastasis ([place])    lung cancer s/p resection 7/20  . Complication of anesthesia 2011   bloodpressure dropped during colonoscopy, not problems since  . Compression fracture   . COPD (chronic obstructive pulmonary disease) ([place])   . Depression   . Diastolic dysfunction   . Diverticulosis   . Dysphagia   . Emphysema lung ([place]) 11/08/2014  . Headache   . Hiatal hernia   . History of kidney stones   . Hypothyroidism   . IBS (irritable bowel syndrome)   . Iron deficiency anemia due to chronic blood loss 12/05/2016  . On home O2 12/15/2013   chronic hypoxia  . Pneumonia   . PUD (peptic ulcer disease)    75yrs  . Shortness of breath    with exertion  . SIADH (syndrome of inappropriate ADH production) ([place])   . Trigeminal neuralgia    Past Surgical History:  Procedure Laterality Date  . ABDOMINAL HYSTERECTOMY    . APPLICATION OF CRANIAL NAVIGATION N/A 03/18/2019   Procedure: APPLICATION OF CRANIAL NAVIGATION;  Surgeon: Consuella Lose, MD;  Location: [facility];  Service: Neurosurgery;  Laterality: N/A;  . BACK SURGERY  January 13, 2014  . CATARACT EXTRACTION    . CATARACT EXTRACTION W/PHACO  09/02/2012   Procedure: CATARACT EXTRACTION PHACO AND INTRAOCULAR LENS PLACEMENT (IOC);  Surgeon: Elta Guadeloupe T. Gershon Crane, MD;  [facility];  Service: Ophthalmology;  Laterality: Left;  CDE:10.35  . CHOLECYSTECTOMY    . 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 COLONOSCOPY N/A 11/23/2014   Dr. Oneida Alar: moderate diverticulosis, hemorrhoids, redundant colon. next TCS in 10-15 years.   . CRANIOTOMY Left 03/18/2019   Procedure: Left stereotactic craniotomy for tumor resection;  Surgeon: Consuella Lose, MD;  Location: [facility];  Service: Neurosurgery;  Laterality: Left;  Left stereotactic craniotomy for tumor resection  . ENDARTERECTOMY Right 08/21/2013   Procedure: RIGHT CAROTID ANEURYSM  RESECTION;  Surgeon: Rosetta Posner, MD;  Location: [facility];  Service: Vascular;  Laterality: Right;  . ESOPHAGEAL DILATION  02/24/2018   Procedure: ESOPHAGEAL DILATION;  Surgeon: Danie Binder, MD;  [facility];  Service: Endoscopy;;  . ESOPHAGOGASTRODUODENOSCOPY  11/13/2009   w/dilation to 28mm, gastric ulceration (H.Pylori) s/p treatment  . ESOPHAGOGASTRODUODENOSCOPY  11/21/2009   distal esophageal web, gastritis  . ESOPHAGOGASTRODUODENOSCOPY  03/14/12   RXV:QMGQQPYPP in the distal esophagus/Mild gastritis/small HH. + H.pylori, prescribed Pylera. Finished treatment.   . ESOPHAGOGASTRODUODENOSCOPY N/A 12/13/2017   Procedure: ESOPHAGOGASTRODUODENOSCOPY (EGD);  Surgeon: Danie Binder, MD;  [facility];  Service: Endoscopy;  Laterality: N/A;  8:30am  . ESOPHAGOGASTRODUODENOSCOPY N/A 02/24/2018   Procedure: ESOPHAGOGASTRODUODENOSCOPY (EGD);  Surgeon: Danie Binder, MD;  [facility];  Service: Endoscopy;  Laterality: N/A;  11:00am  . fatty tumor removal from lt groin    . FRACTURE SURGERY Left    hand and left ring finger  . GIVENS CAPSULE STUDY N/A 12/26/2017   Procedure: GIVENS CAPSULE STUDY;  Surgeon: Danie Binder, MD;  [facility];  Service: Endoscopy;  Laterality: N/A;  7:30am  . GIVENS CAPSULE STUDY N/A 02/24/2018   Procedure: GIVENS CAPSULE STUDY;  Surgeon: Oneida Alar,  Marga Melnick, MD;  [facility];  Service: Endoscopy;  Laterality: N/A;  . KNEE SURGERY     patella tendon repair june 2019 [place]  . LUNG CANCER SURGERY  12/2010   Left VATS, minithoracotomy, LLL superior segmentectomy  . ORIF PATELLA Right 03/18/2018   Procedure: OPEN REDUCTION INTERNAL (ORIF) FIXATION RIGHT PATELLA;  Surgeon: Carole Civil, MD;  [facility];  Service: Orthopedics;  Laterality: Right;  . ORIF WRIST FRACTURE Left 04/09/2014   Procedure: OPEN REDUCTION INTERNAL FIXATION (ORIF) WRIST FRACTURE;  Surgeon: Renette Butters, MD;  Location: [facility];  Service:  Orthopedics;  Laterality: Left;  . PERCUTANEOUS PINNING Left 04/09/2014   Procedure: PERCUTANEOUS PINNING EXTREMITY;  Surgeon: Renette Butters, MD;  Location: [facility];  Service: Orthopedics;  Laterality: Left;  . SAVORY DILATION N/A 12/13/2017   Procedure: SAVORY DILATION;  Surgeon: Danie Binder, MD;  [facility];  Service: Endoscopy;  Laterality: N/A;  . TUBAL LIGATION    . vocal cord surgery  02/06/2011   laryngoscopy with bilateral vocal cord Radiesse injection for vocal cord paralysis  . YAG LASER APPLICATION Left 78/24/2353   Procedure: YAG LASER APPLICATION;  Surgeon: Rutherford Guys, MD;  [facility];  Service: Ophthalmology;  Laterality: Left;     SOCIAL HISTORY:  Social History   Socioeconomic History  . Marital status: Divorced    Spouse name: Not on file  . Number of children: Not on file  . Years of education: Not on file  . Highest education level: Not on file  Occupational History  . Not on file  Social Needs  . Financial resource strain: Somewhat hard  . Food insecurity    Worry: Never true    Inability: Never true  . Transportation needs    Medical: No    Non-medical: No  Tobacco Use  . Smoking status: Former Smoker    Packs/day: 0.50    Years: 20.00    Pack years: 10.00    Types: Cigarettes    Quit date: 12/21/2010    Years since quitting: 8.4  . Smokeless tobacco: Never Used  . Tobacco comment: smoking cessation info given and reviewed   Substance and Sexual Activity  . Alcohol use: Yes    Alcohol/week: 0.0 standard drinks    Comment: seldom  . Drug use: No  . Sexual activity: Yes    Birth control/protection: Surgical  Lifestyle  . Physical activity    Days per week: 0 days    Minutes per session: 0 min  . Stress: Not at all  Relationships  . Social Herbalist on phone: Once a week    Gets together: Never    Attends religious service: Never    Active member of club or organization: No    Attends meetings of clubs or  organizations: Never    Relationship status: Divorced  . Intimate partner violence    Fear of current or ex partner: No    Emotionally abused: No    Physically abused: No    Forced sexual activity: No  Other Topics Concern  . Not on file  Social History Narrative  . Not on file    FAMILY HISTORY:  Family History  Problem Relation Age of Onset  . Pulmonary embolism Mother   . Heart attack Father   . Hypertension Father   . Liver cancer Sister   . Cancer Sister   . Cancer Brother   . Cancer  Daughter   . Colon cancer Neg Hx     CURRENT MEDICATIONS:  Outpatient Encounter Medications as of 06/09/2019  Medication Sig  . ALPRAZolam (XANAX) 0.5 MG tablet Take 1 tablet by mouth at bedtime.   . bisacodyl (BISACODYL) 5 MG EC tablet Take 1 tablet (5 mg total) by mouth daily as needed for moderate constipation.  Marland Kitchen CALCIUM PO Take 1 tablet by mouth daily.  . citalopram (CELEXA) 40 MG tablet Take 0.5 tablets (20 mg total) by mouth at bedtime.  Marland Kitchen denosumab (PROLIA) 60 MG/ML SOSY injection Inject 60 mg into the skin every 6 (six) months.  . ferrous sulfate 325 (65 FE) MG tablet Take 325 mg by mouth daily with breakfast.  . levothyroxine (SYNTHROID) 50 MCG tablet Take 1 tablet (50 mcg total) by mouth daily before breakfast.  . lubiprostone (AMITIZA) 24 MCG capsule Take 24 mcg by mouth 2 (two) times daily with a meal.  . MELATONIN PO Take 1 capsule by mouth daily as needed (sleep).  . Multiple Vitamin (MULTIVITAMIN WITH MINERALS) TABS tablet Take 1 tablet by mouth daily.  . pantoprazole (PROTONIX) 40 MG tablet Take 1 tablet (40 mg total) by mouth daily before breakfast.  . umeclidinium-vilanterol (ANORO ELLIPTA) 62.5-25 MCG/INH AEPB Inhale 1 puff into the lungs daily.  Marland Kitchen acetaminophen (TYLENOL) 325 MG tablet Take 2 tablets (650 mg total) by mouth every 6 (six) hours. (Patient not taking: Reported on 06/09/2019)  . albuterol (VENTOLIN HFA) 108 (90 Base) MCG/ACT inhaler Inhale 2 puffs into the lungs  every 6 (six) hours as needed for wheezing or shortness of breath. (Patient not taking: Reported on 06/09/2019)  . fluticasone (FLONASE) 50 MCG/ACT nasal spray Place 2 sprays into both nostrils daily. (Patient not taking: Reported on 06/08/2019)  . ondansetron (ZOFRAN ODT) 4 MG disintegrating tablet Take 1 tablet (4 mg total) by mouth every 4 (four) hours as needed for nausea or vomiting. (Patient not taking: Reported on 06/08/2019)  . polyethylene glycol (MIRALAX / GLYCOLAX) 17 g packet Take 17 g by mouth daily as needed for mild constipation. (Patient not taking: Reported on 06/08/2019)  . senna-docusate (SENOKOT-S) 8.6-50 MG tablet Take 1 tablet by mouth at bedtime as needed for mild constipation. (Patient not taking: Reported on 06/09/2019)  . tiZANidine (ZANAFLEX) 4 MG tablet Take 1 tablet (4 mg total) by mouth every 6 (six) hours as needed for muscle spasms. (Patient not taking: Reported on 06/09/2019)  . [DISCONTINUED] polyethylene glycol powder (GLYCOLAX/MIRALAX) 17 GM/SCOOP powder Take 17 g by mouth 2 (two) times daily as needed.   No facility-administered encounter medications on file as of 06/09/2019.     ALLERGIES:  Allergies  Allergen Reactions  . Ciprofloxacin Hcl Hives  . Sulfonamide Derivatives Hives and Rash  . Iohexol Other (See Comments)    UNSPECIFIED REACTION  Desc: Per [facility], pt is allergic to IV contrast, no type of reaction was available. Pt cannot remember but first reacted around 15 yrs ago from an IVP. Notes were date from 2009 at urology center., Onset Date: [id number]   . Prozac [Fluoxetine Hcl] Rash     PHYSICAL EXAM:  ECOG Performance status: 1  Vitals:   06/09/19 1120  BP: (!) 86/55  Pulse: 70  Resp: 16  Temp: (!) 97.3 F (36.3 C)  SpO2: 91%   Filed Weights   06/09/19 1120  Weight: 116 lb (52.6 kg)    Physical Exam Constitutional:      Appearance: Normal appearance. She is normal weight.  Cardiovascular:     Rate and Rhythm: Normal rate and  regular rhythm.     Heart sounds: Normal heart sounds.  Pulmonary:     Effort: Pulmonary effort is normal.     Breath sounds: Normal breath sounds.  Abdominal:     General: There is no distension.     Palpations: Abdomen is soft. There is no mass.  Musculoskeletal: Normal range of motion.  Skin:    General: Skin is warm and dry.  Neurological:     Mental Status: She is alert and oriented to person, place, and time. Mental status is at baseline.  Psychiatric:        Mood and Affect: Mood normal.        Behavior: Behavior normal.      LABORATORY DATA:  I have reviewed the labs as listed.  CBC    Component Value Date/Time   WBC 8.2 06/04/2019 1228   RBC 4.44 06/04/2019 1228   HGB 13.0 06/04/2019 1228   HCT 40.4 06/04/2019 1228   PLT 443 (H) 06/04/2019 1228   MCV 91.0 06/04/2019 1228   MCH 29.3 06/04/2019 1228   MCHC 32.2 06/04/2019 1228   RDW 13.2 06/04/2019 1228   LYMPHSABS 1.2 06/04/2019 1228   MONOABS 0.6 06/04/2019 1228   EOSABS 0.1 06/04/2019 1228   BASOSABS 0.0 06/04/2019 1228   CMP Latest Ref Rng & Units 06/04/2019 05/27/2019 05/26/2019  Glucose 70 - 99 mg/dL 102(H) 111(H) 108(H)  BUN 8 - 23 mg/dL 12 14 18   Creatinine 0.44 - 1.00 mg/dL 0.46 0.40(L) 0.48  Sodium 135 - 145 mmol/L 133(L) 134(L) 133(L)  Potassium 3.5 - 5.1 mmol/L 3.9 4.0 3.8  Chloride 98 - 111 mmol/L 98 97(L) 98  CO2 22 - 32 mmol/L 24 26 23   Calcium 8.9 - 10.3 mg/dL 9.5 9.7 9.4  Total Protein 6.5 - 8.1 g/dL 7.2 - -  Total Bilirubin 0.3 - 1.2 mg/dL 0.6 - -  Alkaline Phos 38 - 126 U/L 127(H) - -  AST 15 - 41 U/L 13(L) - -  ALT 0 - 44 U/L 14 - -       DIAGNOSTIC IMAGING:  I have independently reviewed the scans and discussed with the patient.    ASSESSMENT & PLAN:   Adenocarcinoma of lung ([facility]) 1.  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 - She was being monitored with  intermittent scans. - She presented with expressive aphasia to the ER on 03/03/2019 along with right-sided weakness.  She was found to have multiple brain metastasis and underwent SRS on 03/17/2019. -He also underwent stereotactic left parietal craniotomy for resection of tumor on 03/18/2019, biopsy consistent with adenocarcinoma lung primary. - I have reviewed results of the CT chest on 05/26/2019 which did not show any evidence of lung lesions. -I have also reviewed results of CT abdomen and pelvis from 06/04/2019 which did not show any evidence of malignancy in the abdomen and pelvis.  3.3 cm infrarenal abdominal aortic aneurysm and aneurysmal dilatation of the right common iliac artery. -I have recommended a PET CT scan in 3 months followed by follow-up visit. -She has follow-up brain MRI in September 2 week.  Will consider NGS panel and PDL 1 testing if there is any evidence of systemic metastasis.  2.  Iron deficiency state: -Last Feraheme infusion was on 11/21/2017. - We reviewed her CBC from 06/04/2019.  Hemoglobin  is 13.0.  Total time spent is 25 minutes with more than 50% of the time spent face-to-face discussing scan results, surveillance plan, counseling and coordination of care.    Orders placed this encounter:  Orders Placed This Encounter  Procedures  . NM PET Image Restag (PS) Skull Base To Thigh      Derek Jack, MD [place] [phone]

## 2019-06-10 ENCOUNTER — Encounter: Payer: Self-pay | Admitting: Family Medicine

## 2019-06-11 ENCOUNTER — Encounter: Payer: Self-pay | Admitting: Family Medicine

## 2019-06-11 NOTE — Telephone Encounter (Signed)
Hello Dr.Pickard,  This patient is requesting a Home Health referral however I know per medicare guidelines that her Hospital follow up completed on 06/08/2019 does not qualify because it was a telephone visit. For medicare it must be a video visit or face to face office visit in order for it to qualify. I was wondering if you did an addendum to her office visit on 05/25/2019 if they will take that note since it is face to face. Patient mentions that she is extremely weak and I know it will be a lot for her to come in. Do you have any other solutions or possible ways we can work this out so that we can get her [facility]?

## 2019-06-11 NOTE — Telephone Encounter (Signed)
I called patient and spoke with her verbally to notify her that I am going to see if we add additional notes to her office visit on 08/17 if that will qualify her to get home health. After speaking with patient verbally patient verbalized understanding.

## 2019-06-16 NOTE — Addendum Note (Signed)
Addended by: Jenna Luo T on: 06/16/2019 06:31 AM   Modules accepted: Orders

## 2019-06-19 ENCOUNTER — Other Ambulatory Visit: Payer: Self-pay

## 2019-06-19 ENCOUNTER — Encounter: Payer: Self-pay | Admitting: Family Medicine

## 2019-06-19 ENCOUNTER — Ambulatory Visit (INDEPENDENT_AMBULATORY_CARE_PROVIDER_SITE_OTHER): Payer: PPO | Admitting: Family Medicine

## 2019-06-19 VITALS — BP 136/74 | HR 96 | Temp 98.4°F | Resp 19 | Ht 66.0 in | Wt 108.0 lb

## 2019-06-19 DIAGNOSIS — J449 Chronic obstructive pulmonary disease, unspecified: Secondary | ICD-10-CM

## 2019-06-19 DIAGNOSIS — R29898 Other symptoms and signs involving the musculoskeletal system: Secondary | ICD-10-CM

## 2019-06-19 DIAGNOSIS — Z23 Encounter for immunization: Secondary | ICD-10-CM | POA: Diagnosis not present

## 2019-06-19 DIAGNOSIS — R0902 Hypoxemia: Secondary | ICD-10-CM

## 2019-06-19 DIAGNOSIS — M810 Age-related osteoporosis without current pathological fracture: Secondary | ICD-10-CM

## 2019-06-19 MED ORDER — TRELEGY ELLIPTA 100-62.5-25 MCG/INH IN AEPB: 1.0000 | INHALATION_SPRAY | Freq: Every day | RESPIRATORY_TRACT | 5 refills | Status: DC

## 2019-06-19 MED ORDER — DENOSUMAB 60 MG/ML ~~LOC~~ SOSY
60.0000 mg | PREFILLED_SYRINGE | Freq: Once | SUBCUTANEOUS | Status: AC
  Administered 2019-06-19: 60 mg via SUBCUTANEOUS

## 2019-06-19 NOTE — Progress Notes (Signed)
Subjective:    Patient ID: [name], female    DOB: [birth year], 78 y.o.   MRN: [id number]  HPI  05/25/19 Patient recently underwent surgical excision of a mass in her brain due to metastatic lung cancer.  She was discharged from the hospital June 13.  Went to the emergency room with shortness of breath July 12 and was diagnosed with possible pneumonitis.  She presents today with right-sided pleurisy, increasing shortness of breath, hypoxia with ambulation despite being on oxygen 3 L via nasal cannula.  She states that her oxygen will drop into the 80s with minimal activity.  She feels extremely weak.  She is fallen several times.  Today she is tachycardic at rest simply sitting in the office.  Her heart rate is 120 bpm although regular.  Oxygen is 91% sitting on 3 L via nasal cannula but rapidly drops into the 80s with ambulation.  She has fine crackles in the right posterior lung in the lower lobe, middle lobe, upper lobe is clear.  There are also some faint crackles in the left lung.  There is no pitting edema in her legs.  Concern is for right-sided pneumonia.  However the patient has been on antibiotics for a presumed sinus infection over the last 3 weeks.  She was initially placed on Omnicef along with a prednisone taper for sinusitis in July and then was given an additional 10 days of Augmentin due to persistent sinus inflammation.  Both of these visits were telephone visits.  After completing the Augmentin however the patient developed the right-sided pleurisy and chest pain and shortness of breath last week.  Today she did not feels strong enough even to come to the office however she somehow made it.  At that time, my plan was: Patient is ill-appearing.  She appears dehydrated.  She is sitting in her chair slumped over barely able to hold her head up.  She is tachycardic even at rest and hypoxic with minimal activity despite being on oxygen 3 L via nasal cannula.  Physical exam is significant  for tachycardia, dehydration, and prominent rhonchi and rails in the right lung.  I am concerned about community-acquired pneumonia and what is more concerning is the patient has been on 3 weeks of Augmentin and Omnicef for a presumed sinus infection prior to this.  Therefore I think it safe to say that she has community-acquired pneumonia patient has failed outpatient antibiotics and needs IV antibiotics in the hospital.  Patient would also be at high risk for pulmonary embolism given her recent surgery.  Therefore I believe she needs emergency room evaluation if she does not appear stable to be treated as an outpatient based on her clinical appearance today.  Patient is directed to the emergency room and they will be notified of her pending arrival  06/08/19 Patient is being seen today for follow-up.  She was admitted to the hospital from August 17 through August 20.  CT scan obtained in the emergency room on presentation August 16 revealed bibasilar interstitial pneumonitis.  She was treated with steroids while in the hospital and was discharged home on prednisone 40 mg a day for 5 days straight.  She states that her breathing issues have improved dramatically she thinks that the pneumonitis may have begun when she became strangled and choked prior to her hospital admission.  Since discharge from the hospital, she states that her breathing is back to her baseline.  However she still feels extremely weak and  spends majority the day just lying in bed.  She went back to the hospital on August 27 stating that she had not had a bowel movement in more than a week.  CT scan of the abdomen at that time revealed mild distention from the right a sending colon to the transverse colon.  Was recommended at the emergency room that she take MiraLAX twice daily.  She was discharged home.  Patient states that she is still not had a bowel movement.  I reviewed her medication list with her.  She is taking Amitiza twice a day.   She never started taking MiraLAX.  She is not taking Senokot-S.  She states that she is taking enough fiber and water.  She has tried magnesium citrate at home with no benefit.  She is not tried any other stimulant laxatives.  She is not moving around very much due to physical deconditioning.  She is also not eating very much.  She states that prior to being admitted to the hospital on August 17, she became choked while eating, she coughed and retched very hard.  Ever since that time she has had pain in her right ribs.  The pain feels similar to a muscle spasm.  CT scan revealed no pulmonary embolism or right-sided pneumonia or rib fracture.  She states that the pain feels like a cramp.  It comes and goes and it does seem to be positional.  She denies any hemoptysis.  She denies any fever or chills or purulent sputum.  At that time, my plan was: From a respiratory standpoint, her interstitial pneumonitis is clinically improved.  She states that her breathing is back to her baseline.  Her only issues at the present time are her chronic constipation which Amitiza twice daily is not helping.  She never started the Senokot or the MiraLAX recommended at the hospital or the emergency room.  She has not tried any stimulant laxative.  She also has an intercostal muscle strain causing muscle spasms in her right flank.  I will treat her chronic constipation with MiraLAX twice daily as a stool softener coupled with Dulcolax 5 mg daily as needed as a stimulant laxative.  I recommended that she try this for 3 to 4 days first and be consistent with taking it.  I recommended Zanaflex 4 mg every 6 hours as needed for muscle spasm for the intercostal muscle strain.  She would need to be physically seen if symptoms are not improving to rule out other potential causes.  Patient was seen today as a telephone visit.  Phone call began at 1230.  Phone call ended at 1245.  Patient consented to be seen over the telephone and preferred  a telephone visit to avoid coming to the office.  06/19/19 Patient is here today for a face-to-face encounter to try to have home health physical therapy certified.  She reports being extremely weak at home.  She lives by herself.  She has to get friends or neighbors to drive her to the doctor's appointments.  She has to walk using a walker.  She is becoming progressively more deconditioned and is having a difficult time standing and walking around her apartment due to the weakness in her legs.  She states that her shortness of breath is gradually worsening as well.  Today she is 85 to 90% on 2 L via nasal cannula.  On exam she has left basilar crackles that are chronic but right basilar crackles that are pronounced.  Abdominal CT scan obtained at the end of August did show bibasilar atelectasis and I believe that is what I am auscultating on exam.  Patient temporarily benefited from oral prednisone however it seems to have worsened since being home from the hospital off prednisone.  She is currently using a Norco 1 inhalation daily for her COPD in addition to her oxygen.  She also has pronounced kyphosis due to vertebral compression fractures.  This causes a restriction in her thoracic cavity putting compression on her lungs preventing full expansion.  Patient wonders if a back brace may help hold her with a more correct posture and help increase her thoracic capacity. 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[facility])    lung cancer s/p resection 0/08   Complication of anesthesia 2011   bloodpressure dropped during colonoscopy, not problems since   Compression fracture    COPD (chronic obstructive pulmonary disease) ([facility])    Depression    Diastolic dysfunction    Diverticulosis      Dysphagia    Emphysema lung ([facility]) 11/08/2014   Headache    Hiatal hernia    History of kidney stones    Hypothyroidism    IBS (irritable bowel syndrome)    Iron deficiency anemia due to chronic blood loss 12/05/2016   On home O2 12/15/2013   chronic hypoxia   Pneumonia    PUD (peptic ulcer disease)    11yrs   Shortness of breath    with exertion   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 Oneida Alar: moderate diverticulosis, hemorrhoids, redundant colon. next TCS in 10-15 years.    CRANIOTOMY Left 03/18/2019   Procedure: Left stereotactic craniotomy for tumor resection;  Surgeon: Consuella Lose, MD;  Location: [facility];  Service: Neurosurgery;  Laterality: Left;  Left stereotactic craniotomy for tumor resection   ENDARTERECTOMY Right 08/21/2013   Procedure: RIGHT CAROTID ANEURYSM RESECTION;  Surgeon: Rosetta Posner, MD;  Location: [facility];  Service: Vascular;  Laterality: Right;   ESOPHAGEAL DILATION  02/24/2018   Procedure: ESOPHAGEAL DILATION;  Surgeon: Danie Binder, MD;  [facility];  Service: Endoscopy;;   ESOPHAGOGASTRODUODENOSCOPY  11/13/2009   w/dilation to 39mm, gastric ulceration (H.Pylori) s/p treatment   ESOPHAGOGASTRODUODENOSCOPY   11/21/2009   distal esophageal web, gastritis   ESOPHAGOGASTRODUODENOSCOPY  03/14/12   QPY:PPJKDTOIZ in the distal esophagus/Mild gastritis/small HH. + H.pylori, prescribed Pylera. Finished treatment.    ESOPHAGOGASTRODUODENOSCOPY N/A 12/13/2017   Procedure: ESOPHAGOGASTRODUODENOSCOPY (EGD);  Surgeon: Barney Drain  L, MD;  [facility];  Service: Endoscopy;  Laterality: N/A;  8:30am   ESOPHAGOGASTRODUODENOSCOPY N/A 02/24/2018   Procedure: ESOPHAGOGASTRODUODENOSCOPY (EGD);  Surgeon: Danie Binder, MD;  [facility];  Service: Endoscopy;  Laterality: N/A;  11:00am   fatty tumor removal from lt groin     FRACTURE SURGERY Left    hand and left ring finger   GIVENS CAPSULE STUDY N/A 12/26/2017   Procedure: GIVENS CAPSULE STUDY;  Surgeon: Danie Binder, MD;  [facility];  Service: Endoscopy;  Laterality: N/A;  7:30am   GIVENS CAPSULE STUDY N/A 02/24/2018   Procedure: GIVENS CAPSULE STUDY;  Surgeon: Danie Binder, MD;  [facility];  Service: Endoscopy;  Laterality: N/A;   KNEE SURGERY     patella tendon repair june 2019 [facility]   LUNG CANCER SURGERY  12/2010   Left VATS, minithoracotomy, LLL superior segmentectomy   ORIF PATELLA Right 03/18/2018   Procedure: OPEN REDUCTION INTERNAL (ORIF) FIXATION RIGHT PATELLA;  Surgeon: Carole Civil, MD;  Location[facility];  Service: Orthopedics;  Laterality: Right;   ORIF WRIST FRACTURE Left 04/09/2014   Procedure: OPEN REDUCTION INTERNAL FIXATION (ORIF) WRIST FRACTURE;  Surgeon: Renette Butters, MD;  Location: [facility];  Service: Orthopedics;  Laterality: Left;   PERCUTANEOUS PINNING Left 04/09/2014   Procedure: PERCUTANEOUS PINNING EXTREMITY;  Surgeon: Renette Butters, MD;  Location: [name];  Service: Orthopedics;  Laterality: Left;   SAVORY DILATION N/A 12/13/2017   Procedure: SAVORY DILATION;  Surgeon: Danie Binder, MD;  [facility];  Service: Endoscopy;  Laterality: N/A;   TUBAL  LIGATION     vocal cord surgery  02/06/2011   laryngoscopy with bilateral vocal cord Radiesse injection for vocal cord paralysis   YAG LASER APPLICATION Left 01/60/1093   Procedure: YAG LASER APPLICATION;  Surgeon: Rutherford Guys, MD;  Location: [facility];  Service: Ophthalmology;  Laterality: Left;   Current Outpatient Medications on File Prior to Visit  Medication Sig Dispense Refill   albuterol (VENTOLIN HFA) 108 (90 Base) MCG/ACT inhaler Inhale 2 puffs into the lungs every 6 (six) hours as needed for wheezing or shortness of breath. 18 g 2   ALPRAZolam (XANAX) 0.5 MG tablet Take 1 tablet by mouth at bedtime.      bisacodyl (BISACODYL) 5 MG EC tablet Take 1 tablet (5 mg total) by mouth daily as needed for moderate constipation. 30 tablet 0   citalopram (CELEXA) 40 MG tablet Take 0.5 tablets (20 mg total) by mouth at bedtime.     denosumab (PROLIA) 60 MG/ML SOSY injection Inject 60 mg into the skin every 6 (six) months. 1 mL 0   ferrous sulfate 325 (65 FE) MG tablet Take 325 mg by mouth daily with breakfast.     fluticasone (FLONASE) 50 MCG/ACT nasal spray Place 2 sprays into both nostrils daily. 16 g 6   levothyroxine (SYNTHROID) 50 MCG tablet Take 1 tablet (50 mcg total) by mouth daily before breakfast.     lubiprostone (AMITIZA) 24 MCG capsule Take 24 mcg by mouth 2 (two) times daily with a meal.     MELATONIN PO Take 1 capsule by mouth daily as needed (sleep).     Multiple Vitamin (MULTIVITAMIN WITH MINERALS) TABS tablet Take 1 tablet by mouth daily.     ondansetron (ZOFRAN ODT) 4 MG disintegrating tablet Take 1 tablet (4 mg total) by mouth every 4 (four) hours as needed for nausea or vomiting.  pantoprazole (PROTONIX) 40 MG tablet Take 1 tablet (40 mg total) by mouth daily before breakfast. 90 tablet 3   tiZANidine (ZANAFLEX) 4 MG tablet Take 1 tablet (4 mg total) by mouth every 6 (six) hours as needed for muscle spasms. 30 tablet 0   umeclidinium-vilanterol (ANORO ELLIPTA)  62.5-25 MCG/INH AEPB Inhale 1 puff into the lungs daily. 90 each 3   No current facility-administered medications on file prior to visit.    Allergies  Allergen Reactions   Ciprofloxacin Hcl Hives   Sulfonamide Derivatives Hives and Rash   Iohexol Other (See Comments)    UNSPECIFIED REACTION  Desc: Per [facility], pt is allergic to IV contrast, no type of reaction was available. Pt cannot remember but first reacted around 15 yrs ago from an IVP. Notes were date from 2009 at urology center., [place] Date: [id number]    Prozac [Fluoxetine Hcl] Rash   Social History   Socioeconomic History   Marital status: Divorced    Spouse name: Not on file   Number of children: Not on file   Years of education: Not on file   Highest education level: Not on file  Occupational History   Not on file  Social Needs   Financial resource strain: Somewhat hard   Food insecurity    Worry: Never true    Inability: Never true   Transportation needs    Medical: No    Non-medical: No  Tobacco Use   Smoking status: Former Smoker    Packs/day: 0.50    Years: 20.00    Pack years: 10.00    Types: Cigarettes    Quit date: 12/21/2010    Years since quitting: 8.4   Smokeless tobacco: Never Used   Tobacco comment: smoking cessation info given and reviewed   Substance and Sexual Activity   Alcohol use: Yes    Alcohol/week: 0.0 standard drinks    Comment: seldom   Drug use: No   Sexual activity: Yes    Birth control/protection: Surgical  Lifestyle   Physical activity    Days per week: 0 days    Minutes per session: 0 min   Stress: Not at all  Relationships   Social connections    Talks on phone: Once a week    Gets together: Never    Attends religious service: Never    Active member of club or organization: No    Attends meetings of clubs or organizations: Never    Relationship status: Divorced   Intimate partner violence    Fear of current or ex partner: No     Emotionally abused: No    Physically abused: No    Forced sexual activity: No  Other Topics Concern   Not on file  Social History Narrative   Not on file      Review of Systems  All other systems reviewed and are negative.      Objective:  Frail elderly female ambulating with a walker sitting in a chair.  Pronounced kyphosis of the thoracic and cervical spine causing her to hump over in the chair compressing her thoracic cavity.  Patient habitus bibasilar crackles right greater than left in her lower lobes secondary to atelectasis.  She also has hypoxia with a pulse oximetry of 85 to 90% on 2 L via nasal cannula which is below her baseline.  There is no evidence of pulmonary edema in her exam.  Patient has a difficult time standing up from a chair without assistance.  Her legs are weak and deconditioned.  She has a very unsteady gait without the walker.       Assessment & Plan:  Age-related osteoporosis without current pathological fracture - Plan: denosumab (PROLIA) injection 60 mg  Needs flu shot - Plan: Flu Vaccine QUAD High Dose(Fluad)  Hypoxia  COPD (chronic obstructive pulmonary disease) with chronic bronchitis (HCC)  Muscular deconditioning  I will schedule the patient from home health consult to try to arrange physical therapy at home to improve her muscular deconditioning.  I believe this would help with her atelectasis to improve by forcing the patient to take full deep breaths as she becomes more physically active.  I also recommended a back brace to try to hold the patient in a more erect posture to try to help improve the capacity of the chest cavity.  I have recommended discontinuing Norco and maximizing therapy with Trelegy 1 inhalation daily

## 2019-06-22 ENCOUNTER — Encounter: Payer: Self-pay | Admitting: Family Medicine

## 2019-06-24 ENCOUNTER — Telehealth: Payer: Self-pay | Admitting: Family Medicine

## 2019-06-24 DIAGNOSIS — K5904 Chronic idiopathic constipation: Secondary | ICD-10-CM | POA: Diagnosis not present

## 2019-06-24 DIAGNOSIS — R0902 Hypoxemia: Secondary | ICD-10-CM | POA: Diagnosis not present

## 2019-06-24 DIAGNOSIS — K449 Diaphragmatic hernia without obstruction or gangrene: Secondary | ICD-10-CM | POA: Diagnosis not present

## 2019-06-24 DIAGNOSIS — M810 Age-related osteoporosis without current pathological fracture: Secondary | ICD-10-CM | POA: Diagnosis not present

## 2019-06-24 DIAGNOSIS — M40209 Unspecified kyphosis, site unspecified: Secondary | ICD-10-CM | POA: Diagnosis not present

## 2019-06-24 DIAGNOSIS — M62838 Other muscle spasm: Secondary | ICD-10-CM | POA: Diagnosis not present

## 2019-06-24 DIAGNOSIS — R000 Tachycardia, unspecified: Secondary | ICD-10-CM | POA: Diagnosis not present

## 2019-06-24 DIAGNOSIS — D500 Iron deficiency anemia secondary to blood loss (chronic): Secondary | ICD-10-CM | POA: Diagnosis not present

## 2019-06-24 DIAGNOSIS — G500 Trigeminal neuralgia: Secondary | ICD-10-CM | POA: Diagnosis not present

## 2019-06-24 DIAGNOSIS — C3492 Malignant neoplasm of unspecified part of left bronchus or lung: Secondary | ICD-10-CM | POA: Diagnosis not present

## 2019-06-24 DIAGNOSIS — C7931 Secondary malignant neoplasm of brain: Secondary | ICD-10-CM | POA: Diagnosis not present

## 2019-06-24 DIAGNOSIS — E222 Syndrome of inappropriate secretion of antidiuretic hormone: Secondary | ICD-10-CM | POA: Diagnosis not present

## 2019-06-24 DIAGNOSIS — J439 Emphysema, unspecified: Secondary | ICD-10-CM | POA: Diagnosis not present

## 2019-06-24 NOTE — Telephone Encounter (Signed)
[facility] Board called from [facility]  Wanting to get orders for this patient [phone]

## 2019-06-24 NOTE — Telephone Encounter (Signed)
Called [facility] and VO given

## 2019-06-25 ENCOUNTER — Other Ambulatory Visit: Payer: Self-pay | Admitting: Radiation Therapy

## 2019-06-25 ENCOUNTER — Encounter: Payer: Self-pay | Admitting: Family Medicine

## 2019-06-26 ENCOUNTER — Other Ambulatory Visit: Payer: Self-pay

## 2019-06-26 ENCOUNTER — Ambulatory Visit (HOSPITAL_COMMUNITY)
Admission: RE | Admit: 2019-06-26 | Discharge: 2019-06-26 | Disposition: A | Discharge status: Home / Self Care | Payer: PPO | Source: Ambulatory Visit | Attending: Radiation Oncology | Admitting: Radiation Oncology

## 2019-06-26 DIAGNOSIS — C7949 Secondary malignant neoplasm of other parts of nervous system: Secondary | ICD-10-CM | POA: Diagnosis not present

## 2019-06-26 DIAGNOSIS — C3490 Malignant neoplasm of unspecified part of unspecified bronchus or lung: Secondary | ICD-10-CM | POA: Diagnosis not present

## 2019-06-26 DIAGNOSIS — C7931 Secondary malignant neoplasm of brain: Secondary | ICD-10-CM | POA: Diagnosis not present

## 2019-06-26 MED ORDER — GADOBUTROL 1 MMOL/ML IV SOLN
5.0000 mL | Freq: Once | INTRAVENOUS | Status: AC | PRN
  Administered 2019-06-26: 5 mL via INTRAVENOUS

## 2019-06-27 ENCOUNTER — Other Ambulatory Visit: Payer: Self-pay | Admitting: Family Medicine

## 2019-06-28 DIAGNOSIS — M810 Age-related osteoporosis without current pathological fracture: Secondary | ICD-10-CM | POA: Diagnosis not present

## 2019-06-28 DIAGNOSIS — G500 Trigeminal neuralgia: Secondary | ICD-10-CM | POA: Diagnosis not present

## 2019-06-28 DIAGNOSIS — E222 Syndrome of inappropriate secretion of antidiuretic hormone: Secondary | ICD-10-CM | POA: Diagnosis not present

## 2019-06-28 DIAGNOSIS — K5904 Chronic idiopathic constipation: Secondary | ICD-10-CM | POA: Diagnosis not present

## 2019-06-28 DIAGNOSIS — D500 Iron deficiency anemia secondary to blood loss (chronic): Secondary | ICD-10-CM | POA: Diagnosis not present

## 2019-06-28 DIAGNOSIS — C3492 Malignant neoplasm of unspecified part of left bronchus or lung: Secondary | ICD-10-CM | POA: Diagnosis not present

## 2019-06-28 DIAGNOSIS — M40209 Unspecified kyphosis, site unspecified: Secondary | ICD-10-CM | POA: Diagnosis not present

## 2019-06-28 DIAGNOSIS — K449 Diaphragmatic hernia without obstruction or gangrene: Secondary | ICD-10-CM | POA: Diagnosis not present

## 2019-06-28 DIAGNOSIS — C7931 Secondary malignant neoplasm of brain: Secondary | ICD-10-CM | POA: Diagnosis not present

## 2019-06-28 DIAGNOSIS — R0902 Hypoxemia: Secondary | ICD-10-CM | POA: Diagnosis not present

## 2019-06-28 DIAGNOSIS — M62838 Other muscle spasm: Secondary | ICD-10-CM | POA: Diagnosis not present

## 2019-06-28 DIAGNOSIS — J439 Emphysema, unspecified: Secondary | ICD-10-CM | POA: Diagnosis not present

## 2019-06-28 DIAGNOSIS — R000 Tachycardia, unspecified: Secondary | ICD-10-CM | POA: Diagnosis not present

## 2019-06-29 NOTE — Telephone Encounter (Signed)
Requesting refill    Tizanidine  LOV: 06/19/2019  LRF:  06/08/19

## 2019-07-01 ENCOUNTER — Other Ambulatory Visit: Payer: Self-pay

## 2019-07-01 ENCOUNTER — Encounter: Payer: Self-pay | Admitting: Urology

## 2019-07-01 ENCOUNTER — Ambulatory Visit
Admission: RE | Admit: 2019-07-01 | Discharge: 2019-07-01 | Disposition: A | Discharge status: Home / Self Care | Payer: PPO | Source: Ambulatory Visit | Attending: Urology | Admitting: Urology

## 2019-07-01 DIAGNOSIS — Z08 Encounter for follow-up examination after completed treatment for malignant neoplasm: Secondary | ICD-10-CM | POA: Diagnosis not present

## 2019-07-01 DIAGNOSIS — C7931 Secondary malignant neoplasm of brain: Secondary | ICD-10-CM | POA: Diagnosis not present

## 2019-07-01 DIAGNOSIS — Z85118 Personal history of other malignant neoplasm of bronchus and lung: Secondary | ICD-10-CM | POA: Diagnosis not present

## 2019-07-01 NOTE — Progress Notes (Signed)
Radiation Oncology         [phone]) [phone] ________________________________  Name: [name] MRN: [id number]  Date: 07/01/2019  DOB: 1941-01-19  Post Treatment Note: Conducted via telephone due to current COVID-19 concerns for limiting patient exposure  CC: [name], MD  [name], MD   Diagnosis:   78 y.o. 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  Interval Since Last Radiation:  5 weeks  03/17/19:  Pre-Op SRS brain in a single fraction to the following targets: PTV1 Post Lt Parietal 40mm 18Gy PTV2 Lt Cerebellar 5mm  20Gy PTV3 Rt Temp 2mm 20Gy PTV4 Post Lt Insular 5mm  20 Gy PTV5 Lt Frontal Gyrus 6mm  20Gy  Followed by stereotactic left parietal craniotomy for resection of tumor with Dr. Conchita Paris on 03/18/19.  Narrative:  I spoke with the patientl to conduct her routine scheduled 3 month follow up visit via telephone to spare the patient unnecessary potential exposure in the healthcare setting during the current COVID-19 pandemic.  The patient was notified in advance and gave permission to proceed with this visit format.  Her visit today is to review her follow up MRI which was performed on 06/26/19 and shows overall significant improvement with the previously treated lesions in the left cerebellum, inferior right temporal lobe, deep insular region on the left, and left central sulcus region no longer visible.  There is only marginal enhancement along the edges of the post resection cavity in the left posterior parietal lobe are present which appears fairly thin rather than nodular enhancement and therefore is inconclusive.  These images were reviewed at the recent multidisciplinary brain tumor boards on 06/29/19 and consensus recommendation is to just monitor closely on follow up imaging.  She has had recent systemic imaging with CT C/A/P and a follow up with Dr. Ellin Saba to review those results on 06/09/19. Fortunately, her systemic imaging  continues to show disease stability without evidence of any recurrent, progressive or metastatic disease in the C/A/P. She remains on observation only, with plans to repeat imaging with a PET scan in 09/2019 prior to her next follow up with medical oncology.                     On review of systems, she states that she has continued getting along very well with improved balance and mobility since completing treatment. She specifically denies headaches, N/V, dizziness, recent changes in auditory or visual acuity, tremors, difficulty with speech or seizure activity. She has completed formal PT in her home but continues with a home exercise routine that they provided and is able to continue living at home independently which she is quite pleased with.  She was recently evaluated/treated in the ED on 06/04/19 for abdominal pain/constipation and prior to that, had a hospital admission on 05/25/19 for persistent shortness of breath and cough- diagnosed with likely aspiration pneumonia secondary to previous CVA. She has been treated with multiple rounds of antibiotics with the addition of IV steroids during admission and discharged home on oral prednisone which she has since completed. She feels that she is gradually improving from the pneumonia and has resolved the constipation with Miralax BID.  ALLERGIES:  is allergic to ciprofloxacin hcl; sulfonamide derivatives; iohexol; and prozac [fluoxetine hcl].  Meds: Current Outpatient Medications  Medication Sig Dispense Refill  . albuterol (VENTOLIN HFA) 108 (90 Base) MCG/ACT inhaler Inhale 2 puffs into the lungs every 6 (six) hours as needed for wheezing or shortness  of breath. 18 g 2  . ALPRAZolam (XANAX) 0.5 MG tablet Take 1 tablet by mouth at bedtime.     . citalopram (CELEXA) 40 MG tablet Take 0.5 tablets (20 mg total) by mouth at bedtime.    Marland Kitchen denosumab (PROLIA) 60 MG/ML SOSY injection Inject 60 mg into the skin every 6 (six) months. 1 mL 0  . ferrous sulfate  325 (65 FE) MG tablet Take 325 mg by mouth daily with breakfast.    . fluticasone (FLONASE) 50 MCG/ACT nasal spray Place 2 sprays into both nostrils daily. 16 g 6  . Fluticasone-Umeclidin-Vilant (TRELEGY ELLIPTA) 100-62.5-25 MCG/INH AEPB Inhale 1 Inhaler into the lungs daily. 1 each 5  . levothyroxine (SYNTHROID) 50 MCG tablet Take 1 tablet (50 mcg total) by mouth daily before breakfast.    . MELATONIN PO Take 1 capsule by mouth daily as needed (sleep).    . Multiple Vitamin (MULTIVITAMIN WITH MINERALS) TABS tablet Take 1 tablet by mouth daily.    . ondansetron (ZOFRAN ODT) 4 MG disintegrating tablet Take 1 tablet (4 mg total) by mouth every 4 (four) hours as needed for nausea or vomiting.    . pantoprazole (PROTONIX) 40 MG tablet Take 1 tablet (40 mg total) by mouth daily before breakfast. 90 tablet 3  . sennosides-docusate sodium (SENOKOT-S) 8.6-50 MG tablet Take 1 tablet by mouth daily.    Marland Kitchen tiZANidine (ZANAFLEX) 4 MG tablet TAKE (1) TABLET BY MOUTH EVERY SIX HOURS AS NEEDED FOR MUSCLE SPASMS. 30 tablet 2  . bisacodyl (BISACODYL) 5 MG EC tablet Take 1 tablet (5 mg total) by mouth daily as needed for moderate constipation. (Patient not taking: Reported on 07/01/2019) 30 tablet 0  . lubiprostone (AMITIZA) 24 MCG capsule Take 24 mcg by mouth 2 (two) times daily with a meal.    . umeclidinium-vilanterol (ANORO ELLIPTA) 62.5-25 MCG/INH AEPB Inhale 1 puff into the lungs daily. (Patient not taking: Reported on 07/01/2019) 90 each 3   No current facility-administered medications for this encounter.     Physical Findings:  vitals were not taken for this visit.   /Unable to assess due to telephone visit format.   Lab Findings: Lab Results  Component Value Date   WBC 8.2 06/04/2019   HGB 13.0 06/04/2019   HCT 40.4 06/04/2019   MCV 91.0 06/04/2019   PLT 443 (H) 06/04/2019     Radiographic Findings: Mr Laqueta Jean QM Contrast  Result Date: 06/26/2019 CLINICAL DATA:  Follow-up metastatic lung  cancer. Resection June of this year. Radiation subsequent. EXAM: MRI HEAD WITHOUT AND WITH CONTRAST TECHNIQUE: Multiplanar, multiecho pulse sequences of the brain and surrounding structures were obtained without and with intravenous contrast. CONTRAST:  5mL GADAVIST GADOBUTROL 1 MMOL/ML IV SOLN COMPARISON:  03/19/2019.  03/09/2019. FINDINGS: Brain: Marginal enhancement along the ages of the post resection cavity in the left posterior parietal lobe are present. This is fairly thin enhancement rather than nodular enhancement. I think this is inconclusive for the potential residual tumor, an additional follow-up will be necessary. Regional edema and mass effect is diminished but not completely resolved. Other previously treated lesions in the left cerebellum, inferior right temporal lobe, deep insular region on the left, and left central sulcus region have been successfully treated and are no longer visible. No new lesion. No hydrocephalus.  No sign of recent ischemic infarction. Vascular: Major vessels at the base of the brain show flow. Skull and upper cervical spine: Craniotomy changes. Otherwise negative. Sinuses/Orbits: Clear/normal Other: None IMPRESSION: Marginal enhancement  of the resection cavity in the left posterior parietal region. Finding is indeterminate at this point, and further follow-up would obviously be useful. There is less edema and mass effect in the surgical region. No longer any enhancement or mass at the other treated lesions including the left cerebellum, inferior right temporal lobe, deep insula on the left and left central sulcus region. Electronically Signed   By: Paulina Fusi M.D.   On: 06/26/2019 13:16   Ct Abdomen Pelvis W Contrast  Result Date: 06/04/2019 CLINICAL DATA:  78 year old female with abdominal pain. No bowel movement over the. EXAM: CT ABDOMEN AND PELVIS WITH CONTRAST TECHNIQUE: Multidetector CT imaging of the abdomen and pelvis was performed using the standard protocol  following bolus administration of intravenous contrast. CONTRAST:  75mL OMNIPAQUE IOHEXOL 300 MG/ML  SOLN COMPARISON:  CT dated 03/06/2019 FINDINGS: Lower chest: There is emphysematous changes of the lung bases. Bibasilar patchy and streaky densities most likely atelectasis/scarring. Pneumonia is not excluded. Clinical correlation is recommended. There is multi vessel coronary vascular calcification. There is a small pericardial effusion measuring approximately 1 cm in thickness anterior to the heart. This is relatively similar to prior CT. There is no intra-abdominal free air or free fluid. Hepatobiliary: The liver is unremarkable. There is mild intrahepatic biliary ductal dilatation. Cholecystectomy. No retained calcified stone noted in the central CBD. Pancreas: Unremarkable. No pancreatic ductal dilatation or surrounding inflammatory changes. Spleen: Normal in size without focal abnormality. Adrenals/Urinary Tract: The adrenal glands are unremarkable. There is a 3 mm nonobstructing right renal interpolar calculus. There is no hydronephrosis on either side. There is symmetric enhancement and excretion of contrast by both kidneys. Visualized ureters and urinary bladder appear unremarkable. Stomach/Bowel: There is sigmoid diverticulosis without active inflammatory changes. Loose stool noted throughout the colon. There is no bowel obstruction or active inflammation. The appendix is not visualized with certainty. No inflammatory changes identified in the right lower quadrant. Vascular/Lymphatic: Advanced aortoiliac atherosclerotic disease. The abdominal aorta is tortuous. There is a 3.3 cm infrarenal abdominal aortic aneurysm similar to prior CT. There is aneurysmal dilatation of the right common iliac artery measuring up to 2.4 cm in diameter. A low attenuating band like structure in the right common iliac artery, likely represents an old dissection flap. The IVC is unremarkable. No portal venous gas. There is no  adenopathy. Reproductive: Hysterectomy. No pelvic mass. Other: None Musculoskeletal: Osteopenia with scoliosis and extensive degenerative changes of the spine. Multilevel old-appearing compression fractures with lower thoracic/upper lumbar fixation hardware. No acute osseous pathology. IMPRESSION: 1. No acute intra-abdominal or pelvic pathology. 2. Sigmoid diverticulosis. No bowel obstruction or active inflammation. 3. A 3.3 cm infrarenal abdominal aortic aneurysm and aneurysmal dilatation of the right common iliac artery. Follow-up as per recommendation of the prior CT. 4. Emphysema with bibasilar atelectasis/scarring. 5. Aortic Atherosclerosis (ICD10-I70.0) and Emphysema (ICD10-J43.9). Electronically Signed   By: Elgie Collard M.D.   On: 06/04/2019 20:03   Dg Abdomen Acute W/chest  Result Date: 06/04/2019 CLINICAL DATA:  Constipation for 1 week. EXAM: DG ABDOMEN ACUTE W/ 1V CHEST COMPARISON:  06/25/2018 acute abdominal series and 04/19/2019 radiographs FINDINGS: Cardiomegaly and mild bibasilar atelectasis/scarring again noted. No pleural effusion, mass or pneumothorax. Mildly distended gas-filled RIGHT and transverse colon noted. A small amount of gas in the rectum is noted. Little stool is noted within the colon. No dilated small bowel loops are present. No suspicious calcifications noted. Thoracolumbar spinal fusion hardware again noted. IMPRESSION: Nonspecific nonobstructive bowel gas pattern with mildly distended RIGHT  and transverse colon. No evidence of small bowel obstruction. Cardiomegaly with unchanged mild bibasilar atelectasis/scarring. Electronically Signed   By: Harmon Pier M.D.   On: 06/04/2019 13:50    Impression/Plan:  This visit was conducted via telephone to spare the patient unnecessary potential exposure in the healthcare setting during the current COVID-19 pandemic.  1. 78 y.o. 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 She appears to have recovered well from  the effects of her recent pre-op SRS and is currently without complaints.  We reviewed her recent follow up MRI brain from 06/26/19 which shows overall significant improvement with the previously treated lesions in the left cerebellum, inferior right temporal lobe, deep insular region on the left, and left central sulcus region no longer visible.  There is only marginal enhancement along the edges of the post resection cavity in the left posterior parietal lobe are present which appears fairly thin rather than nodular enhancement and therefore is inconclusive.  These images were reviewed at the recent multidisciplinary brain tumor boards and recommendation is to monitor closely on follow up imaging. We will plan to move forward with serial MRI brain scans every 3 months for the first 2 years, to closely monitor for any disease recurrence or progression.  After 2 years, we will move to 6 month brain MRI scans pending her disease remains stable. She will follow up in the office or by telephone following each scan to review results and recommendations from our multidisciplinary brain conference.  She will also remain in routine follow up with Dr. Ellin Saba for continued observation and management of her systemic disease.  She is currently on observation only.  She is advised to call with any questions or concerns in the interim.  Given current concerns for patient exposure during the COVID-19 pandemic, this encounter was conducted via telephone. The patient was notified in advance and was offered a WebEX or MyChart video enabled meeting to allow for face to face communication but unfortunately reported that she did not have the appropriate resources/technology to support such a visit and instead preferred to proceed with a telephone follow up visit. The patient has given verbal consent for this type of encounter. The time spent during this encounter was 25 minutes. The attendants for this meeting include Anntionette Madkins PA-C, and patient, Lichelle Herschberger. During the encounter, Dondrell Loudermilk PA-C, was located at [facility] Radiation Oncology Department.  Patient, Diamone Karraker was located at home.    Marguarite Arbour, PA-C

## 2019-07-05 DIAGNOSIS — M810 Age-related osteoporosis without current pathological fracture: Secondary | ICD-10-CM | POA: Diagnosis not present

## 2019-07-05 DIAGNOSIS — R000 Tachycardia, unspecified: Secondary | ICD-10-CM | POA: Diagnosis not present

## 2019-07-05 DIAGNOSIS — C3492 Malignant neoplasm of unspecified part of left bronchus or lung: Secondary | ICD-10-CM | POA: Diagnosis not present

## 2019-07-05 DIAGNOSIS — E222 Syndrome of inappropriate secretion of antidiuretic hormone: Secondary | ICD-10-CM | POA: Diagnosis not present

## 2019-07-05 DIAGNOSIS — M62838 Other muscle spasm: Secondary | ICD-10-CM | POA: Diagnosis not present

## 2019-07-05 DIAGNOSIS — K5904 Chronic idiopathic constipation: Secondary | ICD-10-CM | POA: Diagnosis not present

## 2019-07-05 DIAGNOSIS — C7931 Secondary malignant neoplasm of brain: Secondary | ICD-10-CM | POA: Diagnosis not present

## 2019-07-05 DIAGNOSIS — R0902 Hypoxemia: Secondary | ICD-10-CM | POA: Diagnosis not present

## 2019-07-05 DIAGNOSIS — D500 Iron deficiency anemia secondary to blood loss (chronic): Secondary | ICD-10-CM | POA: Diagnosis not present

## 2019-07-05 DIAGNOSIS — M40209 Unspecified kyphosis, site unspecified: Secondary | ICD-10-CM | POA: Diagnosis not present

## 2019-07-05 DIAGNOSIS — G500 Trigeminal neuralgia: Secondary | ICD-10-CM | POA: Diagnosis not present

## 2019-07-05 DIAGNOSIS — J439 Emphysema, unspecified: Secondary | ICD-10-CM | POA: Diagnosis not present

## 2019-07-05 DIAGNOSIS — K449 Diaphragmatic hernia without obstruction or gangrene: Secondary | ICD-10-CM | POA: Diagnosis not present

## 2019-07-06 DIAGNOSIS — C3490 Malignant neoplasm of unspecified part of unspecified bronchus or lung: Secondary | ICD-10-CM | POA: Diagnosis not present

## 2019-07-06 DIAGNOSIS — J9621 Acute and chronic respiratory failure with hypoxia: Secondary | ICD-10-CM | POA: Diagnosis not present

## 2019-07-06 DIAGNOSIS — R0902 Hypoxemia: Secondary | ICD-10-CM | POA: Diagnosis not present

## 2019-07-06 DIAGNOSIS — R062 Wheezing: Secondary | ICD-10-CM | POA: Diagnosis not present

## 2019-07-09 DIAGNOSIS — K5904 Chronic idiopathic constipation: Secondary | ICD-10-CM | POA: Diagnosis not present

## 2019-07-09 DIAGNOSIS — M810 Age-related osteoporosis without current pathological fracture: Secondary | ICD-10-CM | POA: Diagnosis not present

## 2019-07-09 DIAGNOSIS — G500 Trigeminal neuralgia: Secondary | ICD-10-CM | POA: Diagnosis not present

## 2019-07-09 DIAGNOSIS — C7931 Secondary malignant neoplasm of brain: Secondary | ICD-10-CM | POA: Diagnosis not present

## 2019-07-09 DIAGNOSIS — J449 Chronic obstructive pulmonary disease, unspecified: Secondary | ICD-10-CM | POA: Diagnosis not present

## 2019-07-09 DIAGNOSIS — R000 Tachycardia, unspecified: Secondary | ICD-10-CM | POA: Diagnosis not present

## 2019-07-09 DIAGNOSIS — E222 Syndrome of inappropriate secretion of antidiuretic hormone: Secondary | ICD-10-CM | POA: Diagnosis not present

## 2019-07-09 DIAGNOSIS — C3490 Malignant neoplasm of unspecified part of unspecified bronchus or lung: Secondary | ICD-10-CM | POA: Diagnosis not present

## 2019-07-09 DIAGNOSIS — M40209 Unspecified kyphosis, site unspecified: Secondary | ICD-10-CM | POA: Diagnosis not present

## 2019-07-09 DIAGNOSIS — R0902 Hypoxemia: Secondary | ICD-10-CM | POA: Diagnosis not present

## 2019-07-09 DIAGNOSIS — M62838 Other muscle spasm: Secondary | ICD-10-CM | POA: Diagnosis not present

## 2019-07-09 DIAGNOSIS — J439 Emphysema, unspecified: Secondary | ICD-10-CM | POA: Diagnosis not present

## 2019-07-09 DIAGNOSIS — K449 Diaphragmatic hernia without obstruction or gangrene: Secondary | ICD-10-CM | POA: Diagnosis not present

## 2019-07-09 DIAGNOSIS — D500 Iron deficiency anemia secondary to blood loss (chronic): Secondary | ICD-10-CM | POA: Diagnosis not present

## 2019-07-09 DIAGNOSIS — C3492 Malignant neoplasm of unspecified part of left bronchus or lung: Secondary | ICD-10-CM | POA: Diagnosis not present

## 2019-07-09 DIAGNOSIS — J9611 Chronic respiratory failure with hypoxia: Secondary | ICD-10-CM | POA: Diagnosis not present

## 2019-07-12 ENCOUNTER — Encounter: Payer: Self-pay | Admitting: Family Medicine

## 2019-07-13 ENCOUNTER — Encounter: Payer: Self-pay | Admitting: Family Medicine

## 2019-07-13 ENCOUNTER — Other Ambulatory Visit: Payer: Self-pay | Admitting: Family Medicine

## 2019-07-13 MED ORDER — DICYCLOMINE HCL 20 MG PO TABS: 20.0000 mg | ORAL_TABLET | Freq: Four times a day (QID) | ORAL | 3 refills | Status: DC

## 2019-07-13 NOTE — Telephone Encounter (Signed)
Medication called/sent to requested pharmacy  

## 2019-07-13 NOTE — Telephone Encounter (Signed)
Pt is requesting refill on Xanax   LOV: 06/19/19  LRF:   05/25/19

## 2019-07-16 ENCOUNTER — Ambulatory Visit (INDEPENDENT_AMBULATORY_CARE_PROVIDER_SITE_OTHER): Payer: PPO | Admitting: Family Medicine

## 2019-07-16 ENCOUNTER — Other Ambulatory Visit: Payer: Self-pay

## 2019-07-16 DIAGNOSIS — R0982 Postnasal drip: Secondary | ICD-10-CM

## 2019-07-16 MED ORDER — LEVOCETIRIZINE DIHYDROCHLORIDE 5 MG PO TABS: 5.0000 mg | ORAL_TABLET | Freq: Every evening | ORAL | 0 refills | Status: DC

## 2019-07-16 NOTE — Progress Notes (Signed)
Subjective:    Patient ID: [name], female    DOB: May 05, 1941, 78 y.o.   MRN: [id number]  HPI Patient is being seen today via telephone visit.  Patient is at home.  She consents to be seen via telephone.  Phone call began at 903.  Phone call concluded at 912.  Patient reports postnasal drip.  She has been taking Flonase 2 sprays each nostril daily without any relief.  She has tried Zyrtec occasionally but not consistently.  She does report some pressure in her left maxillary sinus but denies any pain.  She denies any fevers.  She denies any headaches.  She denies any jaw pain.  She denies any purulent nasal drainage.  She denies any epistaxis  Past Medical History:  Diagnosis Date  . 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 Allergic rhinitis   . 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 Anxiety disorder   . Aortic aneurysm ([place])   . Brain metastasis ([place])    lung cancer s/p resection 7/20  . Complication of anesthesia 2011   bloodpressure dropped during colonoscopy, not problems since  . Compression fracture   . COPD (chronic obstructive pulmonary disease) ([place])   . Depression   . Diastolic dysfunction   . Diverticulosis   . Dysphagia   . Emphysema lung ([place]) 11/08/2014  . Headache   . Hiatal hernia   . History of kidney stones   . Hypothyroidism   . IBS (irritable bowel syndrome)   . Iron deficiency anemia due to chronic blood loss 12/05/2016  . On home O2 12/15/2013   chronic hypoxia  . Pneumonia   . PUD (peptic ulcer disease)    56yrs  . Shortness of breath    with exertion  . SIADH (syndrome of inappropriate ADH production) ([place])   . Trigeminal neuralgia    Past Surgical History:  Procedure Laterality Date  . ABDOMINAL HYSTERECTOMY    . APPLICATION OF CRANIAL NAVIGATION N/A 03/18/2019   Procedure: APPLICATION OF CRANIAL NAVIGATION;  Surgeon: Consuella Lose, MD;  Location: [place];  Service: Neurosurgery;   Laterality: N/A;  . BACK SURGERY  January 13, 2014  . CATARACT EXTRACTION    . CATARACT EXTRACTION W/PHACO  09/02/2012   Procedure: CATARACT EXTRACTION PHACO AND INTRAOCULAR LENS PLACEMENT (IOC);  Surgeon: Elta Guadeloupe T. Gershon Crane, MD;  [facility];  Service: Ophthalmology;  Laterality: Left;  CDE:10.35  . CHOLECYSTECTOMY    . 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 COLONOSCOPY N/A 11/23/2014   Dr. Oneida Alar: moderate diverticulosis, hemorrhoids, redundant colon. next TCS in 10-15 years.   . CRANIOTOMY Left 03/18/2019   Procedure: Left stereotactic craniotomy for tumor resection;  Surgeon: Consuella Lose, MD;  Location: [place];  Service: Neurosurgery;  Laterality: Left;  Left stereotactic craniotomy for tumor resection  . ENDARTERECTOMY Right 08/21/2013   Procedure: RIGHT CAROTID ANEURYSM RESECTION;  Surgeon: Rosetta Posner, MD;  Location: [place];  Service: Vascular;  Laterality: Right;  . ESOPHAGEAL DILATION  02/24/2018   Procedure: ESOPHAGEAL DILATION;  Surgeon: Danie Binder, MD;  [facility];  Service: Endoscopy;;  . ESOPHAGOGASTRODUODENOSCOPY  11/13/2009   w/dilation to 20mm, gastric ulceration (H.Pylori) s/p treatment  . ESOPHAGOGASTRODUODENOSCOPY  11/21/2009   distal esophageal web, gastritis  . ESOPHAGOGASTRODUODENOSCOPY  03/14/12   IPJ:[id number] in the distal esophagus/Mild gastritis/small HH. + H.pylori, prescribed Pylera. Finished treatment.   Marland Kitchen  ESOPHAGOGASTRODUODENOSCOPY N/A 12/13/2017   Procedure: ESOPHAGOGASTRODUODENOSCOPY (EGD);  Surgeon: Danie Binder, MD;  [facility];  Service: Endoscopy;  Laterality: N/A;  8:30am  . ESOPHAGOGASTRODUODENOSCOPY N/A 02/24/2018   Procedure: ESOPHAGOGASTRODUODENOSCOPY (EGD);  Surgeon: Danie Binder, MD;  [facility];  Service: Endoscopy;  Laterality: N/A;  11:00am  . fatty tumor removal from lt groin    . FRACTURE SURGERY Left    hand and left ring finger  . GIVENS CAPSULE STUDY N/A  12/26/2017   Procedure: GIVENS CAPSULE STUDY;  Surgeon: Danie Binder, MD;  [facility];  Service: Endoscopy;  Laterality: N/A;  7:30am  . GIVENS CAPSULE STUDY N/A 02/24/2018   Procedure: GIVENS CAPSULE STUDY;  Surgeon: Danie Binder, MD;  [facility];  Service: Endoscopy;  Laterality: N/A;  . KNEE SURGERY     patella tendon repair june 2019 [place]  . LUNG CANCER SURGERY  12/2010   Left VATS, minithoracotomy, LLL superior segmentectomy  . ORIF PATELLA Right 03/18/2018   Procedure: OPEN REDUCTION INTERNAL (ORIF) FIXATION RIGHT PATELLA;  Surgeon: Carole Civil, MD;  Location[facility];  Service: Orthopedics;  Laterality: Right;  . ORIF WRIST FRACTURE Left 04/09/2014   Procedure: OPEN REDUCTION INTERNAL FIXATION (ORIF) WRIST FRACTURE;  Surgeon: Renette Butters, MD;  Location: [place];  Service: Orthopedics;  Laterality: Left;  . PERCUTANEOUS PINNING Left 04/09/2014   Procedure: PERCUTANEOUS PINNING EXTREMITY;  Surgeon: Renette Butters, MD;  Location: [place];  Service: Orthopedics;  Laterality: Left;  . SAVORY DILATION N/A 12/13/2017   Procedure: SAVORY DILATION;  Surgeon: Danie Binder, MD;  [facility];  Service: Endoscopy;  Laterality: N/A;  . TUBAL LIGATION    . vocal cord surgery  02/06/2011   laryngoscopy with bilateral vocal cord Radiesse injection for vocal cord paralysis  . YAG LASER APPLICATION Left 42/59/5638   Procedure: YAG LASER APPLICATION;  Surgeon: Rutherford Guys, MD;  [facility];  Service: Ophthalmology;  Laterality: Left;   Current Outpatient Medications on File Prior to Visit  Medication Sig Dispense Refill  . albuterol (VENTOLIN HFA) 108 (90 Base) MCG/ACT inhaler Inhale 2 puffs into the lungs every 6 (six) hours as needed for wheezing or shortness of breath. 18 g 2  . ALPRAZolam (XANAX) 0.5 MG tablet TAKE (1) TABLET BY MOUTH TWICE A DAY AS NEEDED. 60 tablet 0  . bisacodyl (BISACODYL) 5 MG EC tablet Take 1 tablet (5 mg total) by mouth  daily as needed for moderate constipation. (Patient not taking: Reported on 07/01/2019) 30 tablet 0  . citalopram (CELEXA) 40 MG tablet Take 0.5 tablets (20 mg total) by mouth at bedtime.    Marland Kitchen denosumab (PROLIA) 60 MG/ML SOSY injection Inject 60 mg into the skin every 6 (six) months. 1 mL 0  . dicyclomine (BENTYL) 20 MG tablet Take 1 tablet (20 mg total) by mouth every 6 (six) hours. 90 tablet 3  . ferrous sulfate 325 (65 FE) MG tablet Take 325 mg by mouth daily with breakfast.    . fluticasone (FLONASE) 50 MCG/ACT nasal spray Place 2 sprays into both nostrils daily. 16 g 6  . Fluticasone-Umeclidin-Vilant (TRELEGY ELLIPTA) 100-62.5-25 MCG/INH AEPB Inhale 1 Inhaler into the lungs daily. 1 each 5  . levothyroxine (SYNTHROID) 50 MCG tablet Take 1 tablet (50 mcg total) by mouth daily before breakfast.    . lubiprostone (AMITIZA) 24 MCG capsule Take 24 mcg by mouth 2 (two) times daily with a meal.    .  MELATONIN PO Take 1 capsule by mouth daily as needed (sleep).    . Multiple Vitamin (MULTIVITAMIN WITH MINERALS) TABS tablet Take 1 tablet by mouth daily.    . ondansetron (ZOFRAN ODT) 4 MG disintegrating tablet Take 1 tablet (4 mg total) by mouth every 4 (four) hours as needed for nausea or vomiting.    . pantoprazole (PROTONIX) 40 MG tablet Take 1 tablet (40 mg total) by mouth daily before breakfast. 90 tablet 3  . sennosides-docusate sodium (SENOKOT-S) 8.6-50 MG tablet Take 1 tablet by mouth daily.    Marland Kitchen tiZANidine (ZANAFLEX) 4 MG tablet TAKE (1) TABLET BY MOUTH EVERY SIX HOURS AS NEEDED FOR MUSCLE SPASMS. 30 tablet 2  . umeclidinium-vilanterol (ANORO ELLIPTA) 62.5-25 MCG/INH AEPB Inhale 1 puff into the lungs daily. (Patient not taking: Reported on 07/01/2019) 90 each 3   No current facility-administered medications on file prior to visit.    Allergies  Allergen Reactions  . Ciprofloxacin Hcl Hives  . Sulfonamide Derivatives Hives and Rash  . Iohexol Other (See Comments)    UNSPECIFIED REACTION   Desc: Per [facility], pt is allergic to IV contrast, no type of reaction was available. Pt cannot remember but first reacted around 15 yrs ago from an IVP. Notes were date from 2009 at urology center., Onset Date: [id number]   . Prozac [Fluoxetine Hcl] Rash   Social History   Socioeconomic History  . Marital status: Divorced    Spouse name: Not on file  . Number of children: Not on file  . Years of education: Not on file  . Highest education level: Not on file  Occupational History  . Not on file  Social Needs  . Financial resource strain: Somewhat hard  . Food insecurity    Worry: Never true    Inability: Never true  . Transportation needs    Medical: No    Non-medical: No  Tobacco Use  . Smoking status: Former Smoker    Packs/day: 0.50    Years: 20.00    Pack years: 10.00    Types: Cigarettes    Quit date: 12/21/2010    Years since quitting: 8.5  . Smokeless tobacco: Never Used  . Tobacco comment: smoking cessation info given and reviewed   Substance and Sexual Activity  . Alcohol use: Yes    Alcohol/week: 0.0 standard drinks    Comment: seldom  . Drug use: No  . Sexual activity: Yes    Birth control/protection: Surgical  Lifestyle  . Physical activity    Days per week: 0 days    Minutes per session: 0 min  . Stress: Not at all  Relationships  . Social Herbalist on phone: Once a week    Gets together: Never    Attends religious service: Never    Active member of club or organization: No    Attends meetings of clubs or organizations: Never    Relationship status: Divorced  . Intimate partner violence    Fear of current or ex partner: No    Emotionally abused: No    Physically abused: No    Forced sexual activity: No  Other Topics Concern  . Not on file  Social History Narrative  . Not on file      Review of Systems  All other systems reviewed and are negative.      Objective:   Physical Exam  No physical exam was performed as  this was a telephone visit  Assessment & Plan:  Postnasal drip  Add Xyzal 5 mg a day to Flonase 2 sprays each nostril daily and see if this improves.  If no better, consider possibly adding Singulair versus treatment for sinusitis

## 2019-07-28 ENCOUNTER — Telehealth: Payer: Self-pay | Admitting: Family Medicine

## 2019-07-28 NOTE — Telephone Encounter (Signed)
Patient calling to say she needs a letter written about her visit on 05/25/19 when she was admitted to hospital for pneumonia, stating what the diagnosis code was, to discuss with patient call  [phone]

## 2019-07-30 ENCOUNTER — Encounter: Payer: Self-pay | Admitting: Family Medicine

## 2019-07-30 NOTE — Telephone Encounter (Signed)
Pt sent mychart message about this as well and the message was sent to Eliezer Mccoy to help pt with the information she needs for her ins.

## 2019-08-05 DIAGNOSIS — R0902 Hypoxemia: Secondary | ICD-10-CM | POA: Diagnosis not present

## 2019-08-05 DIAGNOSIS — R062 Wheezing: Secondary | ICD-10-CM | POA: Diagnosis not present

## 2019-08-05 DIAGNOSIS — J9621 Acute and chronic respiratory failure with hypoxia: Secondary | ICD-10-CM | POA: Diagnosis not present

## 2019-08-05 DIAGNOSIS — C3490 Malignant neoplasm of unspecified part of unspecified bronchus or lung: Secondary | ICD-10-CM | POA: Diagnosis not present

## 2019-08-07 ENCOUNTER — Other Ambulatory Visit: Payer: Self-pay

## 2019-08-07 ENCOUNTER — Ambulatory Visit (INDEPENDENT_AMBULATORY_CARE_PROVIDER_SITE_OTHER): Payer: PPO | Admitting: Family Medicine

## 2019-08-07 DIAGNOSIS — R0982 Postnasal drip: Secondary | ICD-10-CM | POA: Diagnosis not present

## 2019-08-07 NOTE — Progress Notes (Signed)
Subjective:    Patient ID: [name], female    DOB: [birth year], 78 y.o.   MRN: [id number]  HPI  Patient is being seen today as a telephone visit.  Phone call began at 316.  Phone call concluded at 322.  Patient has a few separate questions.  First she is still recovering from her pneumonia.  She is taking Mucinex 1200 mg a day.  She questions if there is any time limit how long she can take that.  Second, the patient reports that she has been denied an oxygen concentrator.  Her insurance company requires her to carry around the oxygen cylinder which is extremely heavy and difficult for the patient to transport.  Third she questions when her last pneumonia vaccine was.  She has had Pneumovax 23 since she has turned 36 and therefore she does not require this again however her chart states that she had Prevnar 13 in 2009.  I question the validity of this is that immunization was not recommended for adults at that time.  Therefore I do not believe that that was Prevnar 13.  I believe that was incorrectly entered and represented a previous vaccination with pneumovax 23.  Therefore, I believe the patient is due for Prevnar 13 Past Medical History:  Diagnosis Date  . 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 Allergic rhinitis   . 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 Anxiety disorder   . Aortic aneurysm ([place])   . Brain metastasis ([place])    lung cancer s/p resection 7/20  . Complication of anesthesia 2011   bloodpressure dropped during colonoscopy, not problems since  . Compression fracture   . COPD (chronic obstructive pulmonary disease) ([place])   . Depression   . Diastolic dysfunction   . Diverticulosis   . Dysphagia   . Emphysema lung ([place]) 11/08/2014  . Headache   . Hiatal hernia   . History of kidney stones   . Hypothyroidism   . IBS (irritable bowel syndrome)   . Iron deficiency anemia due to chronic blood loss 12/05/2016  . On home  O2 12/15/2013   chronic hypoxia  . Pneumonia   . PUD (peptic ulcer disease)    48yrs  . Shortness of breath    with exertion  . SIADH (syndrome of inappropriate ADH production) ([place])   . Trigeminal neuralgia    Past Surgical History:  Procedure Laterality Date  . ABDOMINAL HYSTERECTOMY    . APPLICATION OF CRANIAL NAVIGATION N/A 03/18/2019   Procedure: APPLICATION OF CRANIAL NAVIGATION;  Surgeon: Consuella Lose, MD;  Location: [facility];  Service: Neurosurgery;  Laterality: N/A;  . BACK SURGERY  January 13, 2014  . CATARACT EXTRACTION    . CATARACT EXTRACTION W/PHACO  09/02/2012   Procedure: CATARACT EXTRACTION PHACO AND INTRAOCULAR LENS PLACEMENT (IOC);  Surgeon: Elta Guadeloupe T. Gershon Crane, MD;  [facility]: [facility];  Service: Ophthalmology;  Laterality: Left;  CDE:10.35  . CHOLECYSTECTOMY    . 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 COLONOSCOPY N/A 11/23/2014   Dr. Oneida Alar: moderate diverticulosis, hemorrhoids, redundant colon. next TCS in 10-15 years.   . CRANIOTOMY Left 03/18/2019   Procedure: Left stereotactic craniotomy for tumor resection;  Surgeon: Consuella Lose, MD;  Location: [facility];  Service: Neurosurgery;  Laterality: Left;  Left stereotactic craniotomy for tumor resection  . ENDARTERECTOMY Right 08/21/2013   Procedure: RIGHT CAROTID ANEURYSM RESECTION;  Surgeon: Rosetta Posner, MD;  Location: [facility];  Service: Vascular;  Laterality: Right;  . ESOPHAGEAL DILATION  02/24/2018   Procedure: ESOPHAGEAL DILATION;  Surgeon: Danie Binder, MD;  [facility];  Service: Endoscopy;;  . ESOPHAGOGASTRODUODENOSCOPY  11/13/2009   w/dilation to 66mm, gastric ulceration (H.Pylori) s/p treatment  . ESOPHAGOGASTRODUODENOSCOPY  11/21/2009   distal esophageal web, gastritis  . ESOPHAGOGASTRODUODENOSCOPY  03/14/12   WER:[id number] in the distal esophagus/Mild gastritis/small HH. + H.pylori, prescribed Pylera. Finished treatment.   . ESOPHAGOGASTRODUODENOSCOPY N/A  12/13/2017   Procedure: ESOPHAGOGASTRODUODENOSCOPY (EGD);  Surgeon: Danie Binder, MD;  [facility];  Service: Endoscopy;  Laterality: N/A;  8:30am  . ESOPHAGOGASTRODUODENOSCOPY N/A 02/24/2018   Procedure: ESOPHAGOGASTRODUODENOSCOPY (EGD);  Surgeon: Danie Binder, MD;  [facility];  Service: Endoscopy;  Laterality: N/A;  11:00am  . fatty tumor removal from lt groin    . FRACTURE SURGERY Left    hand and left ring finger  . GIVENS CAPSULE STUDY N/A 12/26/2017   Procedure: GIVENS CAPSULE STUDY;  Surgeon: Danie Binder, MD;  [facility];  Service: Endoscopy;  Laterality: N/A;  7:30am  . GIVENS CAPSULE STUDY N/A 02/24/2018   Procedure: GIVENS CAPSULE STUDY;  Surgeon: Danie Binder, MD;  [facility];  Service: Endoscopy;  Laterality: N/A;  . KNEE SURGERY     patella tendon repair june 2019 [place]  . LUNG CANCER SURGERY  12/2010   Left VATS, minithoracotomy, LLL superior segmentectomy  . ORIF PATELLA Right 03/18/2018   Procedure: OPEN REDUCTION INTERNAL (ORIF) FIXATION RIGHT PATELLA;  Surgeon: Carole Civil, MD;  [facility];  Service: Orthopedics;  Laterality: Right;  . ORIF WRIST FRACTURE Left 04/09/2014   Procedure: OPEN REDUCTION INTERNAL FIXATION (ORIF) WRIST FRACTURE;  Surgeon: Renette Butters, MD;  Location: [place];  Service: Orthopedics;  Laterality: Left;  . PERCUTANEOUS PINNING Left 04/09/2014   Procedure: PERCUTANEOUS PINNING EXTREMITY;  Surgeon: Renette Butters, MD;  Location: [facility];  Service: Orthopedics;  Laterality: Left;  . SAVORY DILATION N/A 12/13/2017   Procedure: SAVORY DILATION;  Surgeon: Danie Binder, MD;  [facility];  Service: Endoscopy;  Laterality: N/A;  . TUBAL LIGATION    . vocal cord surgery  02/06/2011   laryngoscopy with bilateral vocal cord Radiesse injection for vocal cord paralysis  . YAG LASER APPLICATION Left 19/50/9326   Procedure: YAG LASER APPLICATION;  Surgeon: Rutherford Guys, MD;   [facility];  Service: Ophthalmology;  Laterality: Left;   Current Outpatient Medications on File Prior to Visit  Medication Sig Dispense Refill  . albuterol (VENTOLIN HFA) 108 (90 Base) MCG/ACT inhaler Inhale 2 puffs into the lungs every 6 (six) hours as needed for wheezing or shortness of breath. 18 g 2  . ALPRAZolam (XANAX) 0.5 MG tablet TAKE (1) TABLET BY MOUTH TWICE A DAY AS NEEDED. 60 tablet 0  . bisacodyl (BISACODYL) 5 MG EC tablet Take 1 tablet (5 mg total) by mouth daily as needed for moderate constipation. (Patient not taking: Reported on 07/01/2019) 30 tablet 0  . citalopram (CELEXA) 40 MG tablet Take 0.5 tablets (20 mg total) by mouth at bedtime.    Marland Kitchen denosumab (PROLIA) 60 MG/ML SOSY injection Inject 60 mg into the skin every 6 (six) months. 1 mL 0  . dicyclomine (BENTYL) 20 MG tablet Take 1 tablet (20 mg total) by mouth every 6 (six) hours. 90 tablet 3  . ferrous sulfate 325 (  65 FE) MG tablet Take 325 mg by mouth daily with breakfast.    . fluticasone (FLONASE) 50 MCG/ACT nasal spray Place 2 sprays into both nostrils daily. 16 g 6  . Fluticasone-Umeclidin-Vilant (TRELEGY ELLIPTA) 100-62.5-25 MCG/INH AEPB Inhale 1 Inhaler into the lungs daily. 1 each 5  . levocetirizine (XYZAL) 5 MG tablet Take 1 tablet (5 mg total) by mouth every evening. 30 tablet 0  . levothyroxine (SYNTHROID) 50 MCG tablet Take 1 tablet (50 mcg total) by mouth daily before breakfast.    . lubiprostone (AMITIZA) 24 MCG capsule Take 24 mcg by mouth 2 (two) times daily with a meal.    . MELATONIN PO Take 1 capsule by mouth daily as needed (sleep).    . Multiple Vitamin (MULTIVITAMIN WITH MINERALS) TABS tablet Take 1 tablet by mouth daily.    . ondansetron (ZOFRAN ODT) 4 MG disintegrating tablet Take 1 tablet (4 mg total) by mouth every 4 (four) hours as needed for nausea or vomiting.    . pantoprazole (PROTONIX) 40 MG tablet Take 1 tablet (40 mg total) by mouth daily before breakfast. 90 tablet 3  .  sennosides-docusate sodium (SENOKOT-S) 8.6-50 MG tablet Take 1 tablet by mouth daily.    Marland Kitchen tiZANidine (ZANAFLEX) 4 MG tablet TAKE (1) TABLET BY MOUTH EVERY SIX HOURS AS NEEDED FOR MUSCLE SPASMS. 30 tablet 2  . umeclidinium-vilanterol (ANORO ELLIPTA) 62.5-25 MCG/INH AEPB Inhale 1 puff into the lungs daily. (Patient not taking: Reported on 07/01/2019) 90 each 3   No current facility-administered medications on file prior to visit.    Allergies  Allergen Reactions  . Ciprofloxacin Hcl Hives  . Sulfonamide Derivatives Hives and Rash  . Iohexol Other (See Comments)    UNSPECIFIED REACTION  Desc: Per [facility], pt is allergic to IV contrast, no type of reaction was available. Pt cannot remember but first reacted around 15 yrs ago from an IVP. Notes were date from 2009 at urology center., Onset Date: [id number]   . Prozac [Fluoxetine Hcl] Rash   Social History   Socioeconomic History  . Marital status: Divorced    Spouse name: Not on file  . Number of children: Not on file  . Years of education: Not on file  . Highest education level: Not on file  Occupational History  . Not on file  Social Needs  . Financial resource strain: Somewhat hard  . Food insecurity    Worry: Never true    Inability: Never true  . Transportation needs    Medical: No    Non-medical: No  Tobacco Use  . Smoking status: Former Smoker    Packs/day: 0.50    Years: 20.00    Pack years: 10.00    Types: Cigarettes    Quit date: 12/21/2010    Years since quitting: 8.6  . Smokeless tobacco: Never Used  . Tobacco comment: smoking cessation info given and reviewed   Substance and Sexual Activity  . Alcohol use: Yes    Alcohol/week: 0.0 standard drinks    Comment: seldom  . Drug use: No  . Sexual activity: Yes    Birth control/protection: Surgical  Lifestyle  . Physical activity    Days per week: 0 days    Minutes per session: 0 min  . Stress: Not at all  Relationships  . Social Herbalist  on phone: Once a week    Gets together: Never    Attends religious service: Never    Active member of  club or organization: No    Attends meetings of clubs or organizations: Never    Relationship status: Divorced  . Intimate partner violence    Fear of current or ex partner: No    Emotionally abused: No    Physically abused: No    Forced sexual activity: No  Other Topics Concern  . Not on file  Social History Narrative  . Not on file     Review of Systems  HENT: Positive for postnasal drip.   All other systems reviewed and are negative.      Objective:   Physical Exam  No physical exam could be performed today      Assessment & Plan:  Patient really has no major complaints today outside of some postnasal drip which is chronic.  I explained to the patient that she could use Mucinex indefinitely if necessary.  I have recommended that she receive Prevnar 13.  My nurse will check into the reason that she was denied a portable oxygen concentrator because I do believe the patient would benefit from this.  Patient will be charged a level 1 for this office visit given the fact that there was no real emergent issue

## 2019-08-08 ENCOUNTER — Other Ambulatory Visit: Payer: Self-pay | Admitting: Family Medicine

## 2019-08-13 ENCOUNTER — Encounter: Payer: Self-pay | Admitting: Family Medicine

## 2019-08-13 ENCOUNTER — Telehealth: Payer: Self-pay | Admitting: Radiation Oncology

## 2019-08-13 ENCOUNTER — Encounter: Payer: Self-pay | Admitting: Adult Health

## 2019-08-13 NOTE — Telephone Encounter (Signed)
Received call from patient inquiring about MRI in December. Patient reports she had an MRI in May or June then another in September and understood her next one would be in December. She questions if she misunderstood or what the status of this is. Patient understands that Mont Dutton will phone her back at [phone] to clarify. Patient verbalized understanding.

## 2019-08-18 ENCOUNTER — Encounter: Payer: Self-pay | Admitting: Family Medicine

## 2019-08-19 ENCOUNTER — Encounter: Payer: Self-pay | Admitting: Family Medicine

## 2019-08-20 ENCOUNTER — Telehealth: Payer: Self-pay | Admitting: Radiation Therapy

## 2019-08-20 ENCOUNTER — Other Ambulatory Visit: Payer: Self-pay | Admitting: Radiation Therapy

## 2019-08-20 ENCOUNTER — Encounter: Payer: Self-pay | Admitting: Family Medicine

## 2019-08-20 ENCOUNTER — Other Ambulatory Visit: Payer: Self-pay | Admitting: Family Medicine

## 2019-08-20 DIAGNOSIS — C7931 Secondary malignant neoplasm of brain: Secondary | ICD-10-CM

## 2019-08-20 DIAGNOSIS — C7949 Secondary malignant neoplasm of other parts of nervous system: Secondary | ICD-10-CM

## 2019-08-20 MED ORDER — CEFDINIR 300 MG PO CAPS: 300.0000 mg | ORAL_CAPSULE | Freq: Two times a day (BID) | ORAL | 0 refills | Status: DC

## 2019-08-20 NOTE — Addendum Note (Signed)
Addended by: Pincus Large on: 08/20/2019 01:35 PM   Modules accepted: Orders

## 2019-08-20 NOTE — Telephone Encounter (Signed)
Spoke with Ms. Raben about her upcoming brain MRI on 12/17. I let her know that someone from our clerical department will be reaching out to set up a virtual visit with Shona Simpson to review this scan the week of 12/21.   Ms. Losee was very happy for the call.   Mont Dutton R.T.(R)(T) Radiation Special Procedures Navigator

## 2019-08-21 ENCOUNTER — Other Ambulatory Visit: Payer: Self-pay | Admitting: Family Medicine

## 2019-09-08 ENCOUNTER — Other Ambulatory Visit: Payer: Self-pay | Admitting: Family Medicine

## 2019-09-08 ENCOUNTER — Encounter: Payer: Self-pay | Admitting: Radiation Oncology

## 2019-09-08 NOTE — Telephone Encounter (Signed)
Ok to refill??  Last office visit 08/07/2019.  Last refill 07/13/2019.

## 2019-09-14 ENCOUNTER — Other Ambulatory Visit: Payer: Self-pay

## 2019-09-14 ENCOUNTER — Encounter (HOSPITAL_COMMUNITY)
Admission: RE | Admit: 2019-09-14 | Discharge: 2019-09-14 | Disposition: A | Discharge status: Home / Self Care | Payer: PPO | Source: Ambulatory Visit | Attending: Hematology | Admitting: Hematology

## 2019-09-14 DIAGNOSIS — C3492 Malignant neoplasm of unspecified part of left bronchus or lung: Secondary | ICD-10-CM | POA: Insufficient documentation

## 2019-09-14 DIAGNOSIS — C3490 Malignant neoplasm of unspecified part of unspecified bronchus or lung: Secondary | ICD-10-CM | POA: Diagnosis not present

## 2019-09-14 MED ORDER — FLUDEOXYGLUCOSE F - 18 (FDG) INJECTION
5.7800 | Freq: Once | INTRAVENOUS | Status: AC | PRN
  Administered 2019-09-14: 5.78 via INTRAVENOUS

## 2019-09-16 ENCOUNTER — Inpatient Hospital Stay (HOSPITAL_COMMUNITY): Payer: PPO | Attending: Hematology | Admitting: Hematology

## 2019-09-16 VITALS — BP 91/70 | HR 88 | Temp 97.1°F | Resp 20 | Wt 107.0 lb

## 2019-09-16 DIAGNOSIS — Z85118 Personal history of other malignant neoplasm of bronchus and lung: Secondary | ICD-10-CM | POA: Insufficient documentation

## 2019-09-16 DIAGNOSIS — C3492 Malignant neoplasm of unspecified part of left bronchus or lung: Secondary | ICD-10-CM

## 2019-09-16 DIAGNOSIS — E611 Iron deficiency: Secondary | ICD-10-CM | POA: Diagnosis not present

## 2019-09-16 NOTE — Patient Instructions (Signed)
[place] at [facility] Discharge Instructions  You were seen today by Dr. Delton Coombes. He went over your recent test results. He will repeat a CT of your chest in 6 months. He will see you back in 6 months for labs and follow up.   Thank you for choosing [place] at [facility] to provide your oncology and hematology care.  To afford each patient quality time with our provider, please arrive at least 15 minutes before your scheduled appointment time.   If you have a lab appointment with the [facility] please come in thru the  Main Entrance and check in at the main information desk  You need to re-schedule your appointment should you arrive 10 or more minutes late.  We strive to give you quality time with our providers, and arriving late affects you and other patients whose appointments are after yours.  Also, if you no show three or more times for appointments you may be dismissed from the clinic at the providers discretion.     Again, thank you for choosing [facility].  Our hope is that these requests will decrease the amount of time that you wait before being seen by our physicians.       _____________________________________________________________  Should you have questions after your visit to [facility], please contact our office at [phone] between the hours of 8:00 a.m. and 4:30 p.m.  Voicemails left after 4:00 p.m. will not be returned until the following business day.  For prescription refill requests, have your pharmacy contact our office and allow 72 hours.    Cancer Center Support Programs:   > Cancer Support Group  2nd Tuesday of the month 1pm-2pm, Journey Room

## 2019-09-24 ENCOUNTER — Ambulatory Visit
Admission: RE | Admit: 2019-09-24 | Discharge: 2019-09-24 | Disposition: A | Discharge status: Home / Self Care | Payer: PPO | Source: Ambulatory Visit | Attending: Radiation Oncology | Admitting: Radiation Oncology

## 2019-09-24 DIAGNOSIS — C7931 Secondary malignant neoplasm of brain: Secondary | ICD-10-CM

## 2019-09-24 DIAGNOSIS — C3490 Malignant neoplasm of unspecified part of unspecified bronchus or lung: Secondary | ICD-10-CM | POA: Diagnosis not present

## 2019-09-24 DIAGNOSIS — C7949 Secondary malignant neoplasm of other parts of nervous system: Secondary | ICD-10-CM

## 2019-09-24 MED ORDER — GADOBUTROL 1 MMOL/ML IV SOLN
5.0000 mL | Freq: Once | INTRAVENOUS | Status: AC | PRN
  Administered 2019-09-24: 5 mL via INTRAVENOUS

## 2019-09-25 ENCOUNTER — Encounter: Payer: Self-pay | Admitting: Urology

## 2019-09-25 ENCOUNTER — Ambulatory Visit
Admission: RE | Admit: 2019-09-25 | Discharge: 2019-09-25 | Disposition: A | Discharge status: Home / Self Care | Payer: PPO | Source: Ambulatory Visit | Attending: Urology | Admitting: Urology

## 2019-09-25 ENCOUNTER — Other Ambulatory Visit: Payer: Self-pay

## 2019-09-25 DIAGNOSIS — C7931 Secondary malignant neoplasm of brain: Secondary | ICD-10-CM | POA: Diagnosis not present

## 2019-09-25 DIAGNOSIS — Z85118 Personal history of other malignant neoplasm of bronchus and lung: Secondary | ICD-10-CM | POA: Diagnosis not present

## 2019-09-25 DIAGNOSIS — Z08 Encounter for follow-up examination after completed treatment for malignant neoplasm: Secondary | ICD-10-CM | POA: Diagnosis not present

## 2019-09-25 NOTE — Progress Notes (Signed)
Radiation Oncology         [phone] ________________________________  Name: RIANE RUNG MRN: [id number]  Date: 09/25/2019  DOB: 05-27-1941  Post Treatment Note: Conducted via telephone due to current COVID-19 concerns for limiting patient exposure  CC: [name], MD  [name], MD   Diagnosis:   78 y.o. 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  Interval Since Last Radiation:  6 months 03/17/19:  Pre-Op SRS brain in a single fraction to the following targets: PTV1 Post Lt Parietal 27mm 18Gy PTV2 Lt Cerebellar 37mm  20Gy PTV3 Rt Temp 33mm 20Gy PTV4 Post Lt Insular 61mm  20 Gy PTV5 Lt Frontal Gyrus 50mm  20Gy  Followed by stereotactic left parietal craniotomy for resection of tumor with Dr. Kathyrn Sheriff on 03/18/19.  Narrative:  I spoke with the patientl to conduct her routine scheduled 3 month follow up visit via telephone to spare the patient unnecessary potential exposure in the healthcare setting during the current COVID-19 pandemic.  The patient was notified in advance and gave permission to proceed with this visit format.  Her visit today is to review her follow up MRI which was performed on 09/24/19 and shows decreased size of resection cavity and no evidence of recurrent tumor, no new mass or abnormal enhancement. These images will be reviewed at the upcoming multidisciplinary brain tumor boards on 09/28/19 to confirm consensus recommendation to continue monitoring closely with follow up brain imaging in 3 months.  She has had recent systemic imaging for disease restaging with PET on 09/14/19 which appeared stable as well, with no evidence of recurrent or progressive disease.  She remains on observation only and has a scheduled follow up with Dr. Delton Coombes in 03/2020 with plans to repeat systemic imaging with CT Chest prior to that visit. Fortunately, her systemic imaging continues to show disease stability without evidence of any recurrent,  progressive or metastatic disease.                     On review of systems, she states that she has continued getting along very well with improved balance and mobility since completing treatment. She specifically denies headaches, N/V, dizziness, recent changes in auditory or visual acuity, tremors, difficulty with speech or seizure activity. She has completed formal PT in her home but continues with a home exercise routine that they provided and is able to continue living at home independently which she is quite pleased with.  She had a bout with aspiration pneumonia in 05/2018 which has finally resolved after multiple courses of antibiotics and steroids.  She has not had recent productive cough, increased shortness of breath, hemoptysis, fever or chills.  She also reports that the constipation that she was struggling with previously has since resolved with use of Miralax prn.  ALLERGIES:  is allergic to ciprofloxacin hcl; sulfonamide derivatives; iohexol; and prozac [fluoxetine hcl].  Meds: Current Outpatient Medications  Medication Sig Dispense Refill  . albuterol (VENTOLIN HFA) 108 (90 Base) MCG/ACT inhaler Inhale 2 puffs into the lungs every 6 (six) hours as needed for wheezing or shortness of breath. 18 g 2  . ALPRAZolam (XANAX) 0.5 MG tablet TAKE (1) TABLET BY MOUTH TWICE A DAY AS NEEDED. (Patient taking differently: Take 0.5 mg by mouth at bedtime. ) 60 tablet 0  . ANORO ELLIPTA 62.5-25 MCG/INH AEPB INHALE 1 PUFF INTO THE LUNGS DAILY. 60 each 5  . bisacodyl (BISACODYL) 5 MG EC tablet Take 1 tablet (5  mg total) by mouth daily as needed for moderate constipation. 30 tablet 0  . citalopram (CELEXA) 40 MG tablet Take 0.5 tablets (20 mg total) by mouth at bedtime.    Marland Kitchen denosumab (PROLIA) 60 MG/ML SOSY injection Inject 60 mg into the skin every 6 (six) months. 1 mL 0  . dicyclomine (BENTYL) 20 MG tablet Take 1 tablet (20 mg total) by mouth every 6 (six) hours. 90 tablet 3  . ferrous sulfate 325  (65 FE) MG tablet Take 325 mg by mouth daily with breakfast.    . fluticasone (FLONASE) 50 MCG/ACT nasal spray Place 2 sprays into both nostrils daily. 16 g 6  . Fluticasone-Umeclidin-Vilant (TRELEGY ELLIPTA) 100-62.5-25 MCG/INH AEPB Inhale 1 Inhaler into the lungs daily. 1 each 5  . levocetirizine (XYZAL) 5 MG tablet TAKE ONE TABLET BY MOUTH ONCE DAILY. 30 tablet 5  . levothyroxine (SYNTHROID) 50 MCG tablet Take 1 tablet (50 mcg total) by mouth daily before breakfast.    . lubiprostone (AMITIZA) 24 MCG capsule Take 24 mcg by mouth 2 (two) times daily with a meal.    . MELATONIN PO Take 1 capsule by mouth daily as needed (sleep).    . Multiple Vitamin (MULTIVITAMIN WITH MINERALS) TABS tablet Take 1 tablet by mouth daily.    . ondansetron (ZOFRAN ODT) 4 MG disintegrating tablet Take 1 tablet (4 mg total) by mouth every 4 (four) hours as needed for nausea or vomiting.    . pantoprazole (PROTONIX) 40 MG tablet Take 1 tablet (40 mg total) by mouth daily before breakfast. 90 tablet 3  . sennosides-docusate sodium (SENOKOT-S) 8.6-50 MG tablet Take 1 tablet by mouth daily.    Marland Kitchen tiZANidine (ZANAFLEX) 4 MG tablet TAKE (1) TABLET BY MOUTH EVERY SIX HOURS AS NEEDED FOR MUSCLE SPASMS. (Patient not taking: Reported on 09/25/2019) 30 tablet 2   No current facility-administered medications for this encounter.    Physical Findings:  vitals were not taken for this visit.   /Unable to assess due to telephone visit format.   Lab Findings: Lab Results  Component Value Date   WBC 8.2 06/04/2019   HGB 13.0 06/04/2019   HCT 40.4 06/04/2019   MCV 91.0 06/04/2019   PLT 443 (H) 06/04/2019     Radiographic Findings: MR Brain W Wo Contrast  Result Date: 09/24/2019 CLINICAL DATA:  Metastatic lung cancer post resection and SRS EXAM: MRI HEAD WITHOUT AND WITH CONTRAST TECHNIQUE: Multiplanar, multiecho pulse sequences of the brain and surrounding structures were obtained without and with intravenous contrast.  CONTRAST:  18mL GADAVIST GADOBUTROL 1 MMOL/ML IV SOLN COMPARISON:  06/26/2019 FINDINGS: Brain: Postoperative changes of left parietooccipital craniotomy again identified with interval contraction of underlying resection cavity. A thin extra-axial collection remains present. There is no new nodular enhancement to suggest tumor recurrence. Extent of surrounding T2 FLAIR hyperintensity remains similar. There is no new mass or abnormal enhancement. There is no hydrocephalus. Prominence of the ventricles and sulci reflects stable parenchymal volume loss. Additional patchy T2 hyperintensity in the supratentorial and pontine white matter is nonspecific but may reflect chronic microvascular ischemic changes. No acute infarction. There is no acute infarction or intracranial hemorrhage. There is no intracranial mass, mass effect, or edema. There is no hydrocephalus or extra-axial fluid collection. No abnormal enhancement. Vascular: Major vessel flow voids at the skull base are preserved. Skull and upper cervical spine: Normal marrow signal is preserved. Sinuses/Orbits: Minor mucosal thickening. Bilateral lens replacements. Other: Sella is unremarkable.  Mastoid air cells are clear.  IMPRESSION: Decreased size of resection cavity.  No evidence of recurrent tumor. No new mass or abnormal enhancement. Electronically Signed   By: Macy Mis M.D.   On: 09/24/2019 12:20   NM PET Image Restag (PS) Skull Base To Thigh  Result Date: 09/14/2019 CLINICAL DATA:  Subsequent treatment strategy for lung cancer. Status post radiation. EXAM: NUCLEAR MEDICINE PET SKULL BASE TO THIGH TECHNIQUE: 5.8 mCi F-18 FDG was injected intravenously. Full-ring PET imaging was performed from the skull base to thigh after the radiotracer. CT data was obtained and used for attenuation correction and anatomic localization. Fasting blood glucose: 89 mg/dl COMPARISON:  CT abdomen/pelvis dated 06/04/2019. CTA chest dated 05/26/2019. FINDINGS: Mediastinal  blood pool activity: SUV max 2.0 Liver activity: SUV max NA NECK: No hypermetabolic thoracic lymphadenopathy. Incidental CT findings: none CHEST: Status post left lower lobe wedge resection. Associated linear scarring/atelectasis in the left lower lobe. No hypermetabolic pulmonary nodules. Stable 3 mm nodule in the superior segment right lower lobe (series 3/image 80), benign. Moderate centrilobular emphysematous changes, upper lung predominant. No hypermetabolic thoracic lymphadenopathy. Incidental CT findings: Atherosclerotic calcifications of the aortic root/arch. Coronary atherosclerosis of the left circumflex. ABDOMEN/PELVIS: No abnormal hypermetabolism in the liver, spleen, pancreas, or adrenal glands. No hypermetabolic abdominopelvic lymphadenopathy. Excretory hypermetabolism along the course of the right ureter. Incidental CT findings: Motion degraded images. 3.2 cm infrarenal abdominal aortic aneurysm. Atherosclerotic calcifications the abdominal aorta and branch vessels. Prior cholecystectomy. SKELETON: No focal hypermetabolic activity to suggest skeletal metastasis. Incidental CT findings: Degenerative changes of the visualized thoracolumbar spine. IMPRESSION: Status post left lower lobe wedge resection. No findings suspicious for recurrent or metastatic disease. Electronically Signed   By: Julian Hy M.D.   On: 09/14/2019 20:17    Impression/Plan:  This visit was conducted via telephone to spare the patient unnecessary potential exposure in the healthcare setting during the current COVID-19 pandemic.  68. 78 y.o. 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 She continues to do well clinically and has recovered well from the effects of her pre-op SRS and is currently without complaints.  We reviewed her recent follow up MRI brain from 09/24/19 which shows decreased size of resection cavity and no evidence of recurrent tumor, no new mass or abnormal enhancement. These images  will be reviewed at the upcoming multidisciplinary brain tumor boards on 09/28/19 to confirm consensus recommendation to continue monitoring closely with follow up brain imaging in 3 months.   We will plan to continue with serial MRI brain scans every 3 months for the first 2 years, to closely monitor for any disease recurrence or progression.  After 2 years, we will move to 6 month brain MRI scans pending her disease remains stable. She will follow up in the office or by telephone following each scan to review results and recommendations from our multidisciplinary brain conference.  She will also remain in routine follow up with Dr. Delton Coombes for continued observation and management of her systemic disease.  She is currently on observation only.  She is advised to call with any questions or concerns in the interim.  Given current concerns for patient exposure during the COVID-19 pandemic, this encounter was conducted via telephone. The patient was notified in advance and was offered a WebEX or [place] video enabled meeting to allow for face to face communication but unfortunately reported that she did not have the appropriate resources/technology to support such a visit and instead preferred to proceed with a telephone follow up visit. The patient has  given verbal consent for this type of encounter. The time spent during this encounter was 25 minutes. The attendants for this meeting include Sheretha Shadd PA-C, and patient, Karalee Hauter. During the encounter, Travoris Bushey PA-C, was located at [facility] Radiation Oncology Department.  Patient, Vaishnavi Dalby was located at home.    Nicholos Johns, PA-C

## 2019-09-28 ENCOUNTER — Ambulatory Visit: Payer: Self-pay | Admitting: Radiation Oncology

## 2019-09-28 ENCOUNTER — Inpatient Hospital Stay: Admission: RE | Admit: 2019-09-28 | Payer: Self-pay | Source: Ambulatory Visit | Admitting: Radiation Oncology

## 2019-09-28 ENCOUNTER — Ambulatory Visit (INDEPENDENT_AMBULATORY_CARE_PROVIDER_SITE_OTHER): Payer: PPO | Admitting: Family Medicine

## 2019-09-28 DIAGNOSIS — J392 Other diseases of pharynx: Secondary | ICD-10-CM

## 2019-09-28 NOTE — Progress Notes (Signed)
Subjective:    Patient ID: [name], female    DOB: 1940/11/03, 78 y.o.   MRN: [id number]  HPI Patient is being seen today as a telephone visit.  Patient consents to be seen via telephone.  Phone call began at 337.  Phone call concluded at 347.  Patient states that ever since I switched her from Anoro to Trelegy she has developed an irritation in her throat.  She denies a sore throat is much as a constant irritation.  She also reports occasional hoarseness.  She feels like she has mucus around her vocal cords.  She feels a tickle and burning sensation in the roof of her mouth and in the back of her mouth.  This is gone on for several weeks.  She denies any fevers or chills.  She denies any lymphadenopathy.  She denies any indigestion or heartburn.  She denies any hemoptysis, odynophagia, or dysphagia Past Medical History:  Diagnosis Date  . Adenocarcinoma of lung ([place]) 12/2010   left lung/surg only  . Allergic rhinitis   . 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 Anxiety disorder   . Aortic aneurysm ([place])   . Brain metastasis ([place])    lung cancer s/p resection 7/20  . Complication of anesthesia 2011   bloodpressure dropped during colonoscopy, not problems since  . Compression fracture   . COPD (chronic obstructive pulmonary disease) ([place])   . Depression   . Diastolic dysfunction   . Diverticulosis   . Dysphagia   . Emphysema lung ([place]) 11/08/2014  . Headache   . Hiatal hernia   . History of kidney stones   . Hypothyroidism   . IBS (irritable bowel syndrome)   . Iron deficiency anemia due to chronic blood loss 12/05/2016  . On home O2 12/15/2013   chronic hypoxia  . Pneumonia   . PUD (peptic ulcer disease)    22yrs  . Shortness of breath    with exertion  . SIADH (syndrome of inappropriate ADH production) ([place])   . Trigeminal neuralgia    Past Surgical History:  Procedure Laterality Date  . ABDOMINAL HYSTERECTOMY    . APPLICATION OF CRANIAL  NAVIGATION N/A 03/18/2019   Procedure: APPLICATION OF CRANIAL NAVIGATION;  Surgeon: Consuella Lose, MD;  Location: [facility];  Service: Neurosurgery;  Laterality: N/A;  . BACK SURGERY  January 13, 2014  . CATARACT EXTRACTION    . CATARACT EXTRACTION W/PHACO  09/02/2012   Procedure: CATARACT EXTRACTION PHACO AND INTRAOCULAR LENS PLACEMENT (IOC);  Surgeon: Elta Guadeloupe T. Gershon Crane, MD;  [facility];  Service: Ophthalmology;  Laterality: Left;  CDE:10.35  . CHOLECYSTECTOMY    . 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 COLONOSCOPY N/A 11/23/2014   Dr. Oneida Alar: moderate diverticulosis, hemorrhoids, redundant colon. next TCS in 10-15 years.   . CRANIOTOMY Left 03/18/2019   Procedure: Left stereotactic craniotomy for tumor resection;  Surgeon: Consuella Lose, MD;  Location: [facility];  Service: Neurosurgery;  Laterality: Left;  Left stereotactic craniotomy for tumor resection  . ENDARTERECTOMY Right 08/21/2013   Procedure: RIGHT CAROTID ANEURYSM RESECTION;  Surgeon: Rosetta Posner, MD;  Location: [employer];  Service: Vascular;  Laterality: Right;  . ESOPHAGEAL DILATION  02/24/2018   Procedure: ESOPHAGEAL DILATION;  Surgeon: Danie Binder, MD;  [facility];  Service: Endoscopy;;  . ESOPHAGOGASTRODUODENOSCOPY  11/13/2009   w/dilation to 43mm, gastric ulceration (H.Pylori) s/p treatment  .  ESOPHAGOGASTRODUODENOSCOPY  11/21/2009   distal esophageal web, gastritis  . ESOPHAGOGASTRODUODENOSCOPY  03/14/12   [id number]:[id number] in the distal esophagus/Mild gastritis/small HH. + H.pylori, prescribed Pylera. Finished treatment.   . ESOPHAGOGASTRODUODENOSCOPY N/A 12/13/2017   Procedure: ESOPHAGOGASTRODUODENOSCOPY (EGD);  Surgeon: Danie Binder, MD;  [facility];  Service: Endoscopy;  Laterality: N/A;  8:30am  . ESOPHAGOGASTRODUODENOSCOPY N/A 02/24/2018   Procedure: ESOPHAGOGASTRODUODENOSCOPY (EGD);  Surgeon: Danie Binder, MD;  [facility];  Service: Endoscopy;   Laterality: N/A;  11:00am  . fatty tumor removal from lt groin    . FRACTURE SURGERY Left    hand and left ring finger  . GIVENS CAPSULE STUDY N/A 12/26/2017   Procedure: GIVENS CAPSULE STUDY;  Surgeon: Danie Binder, MD;  [facility];  Service: Endoscopy;  Laterality: N/A;  7:30am  . GIVENS CAPSULE STUDY N/A 02/24/2018   Procedure: GIVENS CAPSULE STUDY;  Surgeon: Danie Binder, MD;  [facility];  Service: Endoscopy;  Laterality: N/A;  . KNEE SURGERY     patella tendon repair june 2019 [facility]  . LUNG CANCER SURGERY  12/2010   Left VATS, minithoracotomy, LLL superior segmentectomy  . ORIF PATELLA Right 03/18/2018   Procedure: OPEN REDUCTION INTERNAL (ORIF) FIXATION RIGHT PATELLA;  Surgeon: Carole Civil, MD;  Location[facility];  Service: Orthopedics;  Laterality: Right;  . ORIF WRIST FRACTURE Left 04/09/2014   Procedure: OPEN REDUCTION INTERNAL FIXATION (ORIF) WRIST FRACTURE;  Surgeon: Renette Butters, MD;  Location: [facility];  Service: Orthopedics;  Laterality: Left;  . PERCUTANEOUS PINNING Left 04/09/2014   Procedure: PERCUTANEOUS PINNING EXTREMITY;  Surgeon: Renette Butters, MD;  Location: [facility];  Service: Orthopedics;  Laterality: Left;  . SAVORY DILATION N/A 12/13/2017   Procedure: SAVORY DILATION;  Surgeon: Danie Binder, MD;  [facility];  Service: Endoscopy;  Laterality: N/A;  . TUBAL LIGATION    . vocal cord surgery  02/06/2011   laryngoscopy with bilateral vocal cord Radiesse injection for vocal cord paralysis  . YAG LASER APPLICATION Left 38/18/2993   Procedure: YAG LASER APPLICATION;  Surgeon: Rutherford Guys, MD;  [facility];  Service: Ophthalmology;  Laterality: Left;   Current Outpatient Medications on File Prior to Visit  Medication Sig Dispense Refill  . albuterol (VENTOLIN HFA) 108 (90 Base) MCG/ACT inhaler Inhale 2 puffs into the lungs every 6 (six) hours as needed for wheezing or shortness of breath. 18 g 2  . ALPRAZolam (XANAX)  0.5 MG tablet TAKE (1) TABLET BY MOUTH TWICE A DAY AS NEEDED. (Patient taking differently: Take 0.5 mg by mouth at bedtime. ) 60 tablet 0  . ANORO ELLIPTA 62.5-25 MCG/INH AEPB INHALE 1 PUFF INTO THE LUNGS DAILY. 60 each 5  . bisacodyl (BISACODYL) 5 MG EC tablet Take 1 tablet (5 mg total) by mouth daily as needed for moderate constipation. 30 tablet 0  . citalopram (CELEXA) 40 MG tablet Take 0.5 tablets (20 mg total) by mouth at bedtime.    Marland Kitchen denosumab (PROLIA) 60 MG/ML SOSY injection Inject 60 mg into the skin every 6 (six) months. 1 mL 0  . dicyclomine (BENTYL) 20 MG tablet Take 1 tablet (20 mg total) by mouth every 6 (six) hours. 90 tablet 3  . ferrous sulfate 325 (65 FE) MG tablet Take 325 mg by mouth daily with breakfast.    . fluticasone (FLONASE) 50 MCG/ACT nasal spray Place 2 sprays into both nostrils daily. 16 g 6  . Fluticasone-Umeclidin-Vilant (  TRELEGY ELLIPTA) 100-62.5-25 MCG/INH AEPB Inhale 1 Inhaler into the lungs daily. 1 each 5  . levocetirizine (XYZAL) 5 MG tablet TAKE ONE TABLET BY MOUTH ONCE DAILY. 30 tablet 5  . levothyroxine (SYNTHROID) 50 MCG tablet Take 1 tablet (50 mcg total) by mouth daily before breakfast.    . lubiprostone (AMITIZA) 24 MCG capsule Take 24 mcg by mouth 2 (two) times daily with a meal.    . MELATONIN PO Take 1 capsule by mouth daily as needed (sleep).    . Multiple Vitamin (MULTIVITAMIN WITH MINERALS) TABS tablet Take 1 tablet by mouth daily.    . ondansetron (ZOFRAN ODT) 4 MG disintegrating tablet Take 1 tablet (4 mg total) by mouth every 4 (four) hours as needed for nausea or vomiting.    . pantoprazole (PROTONIX) 40 MG tablet Take 1 tablet (40 mg total) by mouth daily before breakfast. 90 tablet 3  . sennosides-docusate sodium (SENOKOT-S) 8.6-50 MG tablet Take 1 tablet by mouth daily.    Marland Kitchen tiZANidine (ZANAFLEX) 4 MG tablet TAKE (1) TABLET BY MOUTH EVERY SIX HOURS AS NEEDED FOR MUSCLE SPASMS. (Patient not taking: Reported on 09/25/2019) 30 tablet 2   No  current facility-administered medications on file prior to visit.   Allergies  Allergen Reactions  . Ciprofloxacin Hcl Hives  . Sulfonamide Derivatives Hives and Rash  . Iohexol Other (See Comments)    UNSPECIFIED REACTION  Desc: Per [facility], pt is allergic to IV contrast, no type of reaction was available. Pt cannot remember but first reacted around 15 yrs ago from an IVP. Notes were date from 2009 at urology center., Onset Date: [id number]   . Prozac [Fluoxetine Hcl] Rash   Social History   Socioeconomic History  . Marital status: Divorced    Spouse name: Not on file  . Number of children: Not on file  . Years of education: Not on file  . Highest education level: Not on file  Occupational History  . Not on file  Tobacco Use  . Smoking status: Former Smoker    Packs/day: 0.50    Years: 20.00    Pack years: 10.00    Types: Cigarettes    Quit date: 12/21/2010    Years since quitting: 8.7  . Smokeless tobacco: Never Used  . Tobacco comment: smoking cessation info given and reviewed   Substance and Sexual Activity  . Alcohol use: Yes    Alcohol/week: 0.0 standard drinks    Comment: seldom  . Drug use: No  . Sexual activity: Yes    Birth control/protection: Surgical  Other Topics Concern  . Not on file  Social History Narrative  . Not on file   Social Determinants of Health   Financial Resource Strain:   . Difficulty of Paying Living Expenses: Not on file  Food Insecurity:   . Worried About Charity fundraiser in the Last Year: Not on file  . Ran Out of Food in the Last Year: Not on file  Transportation Needs:   . Lack of Transportation (Medical): Not on file  . Lack of Transportation (Non-Medical): Not on file  Physical Activity:   . Days of Exercise per Week: Not on file  . Minutes of Exercise per Session: Not on file  Stress:   . Feeling of Stress : Not on file  Social Connections:   . Frequency of Communication with Friends and Family: Not on file  .  Frequency of Social Gatherings with Friends and Family: Not on file  .  Attends Religious Services: Not on file  . Active Member of Clubs or Organizations: Not on file  . Attends Archivist Meetings: Not on file  . Marital Status: Not on file  Intimate Partner Violence:   . Fear of Current or Ex-Partner: Not on file  . Emotionally Abused: Not on file  . Physically Abused: Not on file  . Sexually Abused: Not on file      Review of Systems  All other systems reviewed and are negative.      Objective:   Physical Exam        Assessment & Plan:  Throat irritation  Physical exam cannot be performed today as the patient was seen via telephone.  The throat irritation could certainly be due to the steroid component of Trelegy.  Other possibilities would include laryngoesophageal reflux, postnasal drip, viral infection.  Patient denies any thrush in her mouth.  She will try switching from Trelegy back to [facility] and then notify me in 7 to 10 days if symptoms persist.  She is taking a proton pump inhibitor for acid reflux already.

## 2019-10-11 NOTE — Assessment & Plan Note (Signed)
1.  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 - She was being monitored with intermittent scans. - She presented with expressive aphasia to the ER on 03/03/2019 along with right-sided weakness.  She was found to have multiple brain metastasis and underwent SRS on 03/17/2019. -He also underwent stereotactic left parietal craniotomy for resection of tumor on 03/18/2019, biopsy consistent with adenocarcinoma lung primary. -We reviewed results of PET scan from 09/14/2019 which did not show any evidence of recurrence or metastatic disease. -She has MRI of the brain scheduled on 09/24/2019. -I plan to see her back in 6 months for follow-up with repeat CT scan.  2.  Iron deficiency state: -Last Feraheme infusion was on 11/21/2017. - We reviewed her CBC from 06/04/2019.  Hemoglobin is 13.0.

## 2019-10-11 NOTE — Progress Notes (Signed)
[name], [place] [address]   CLINIC:  Medical Oncology/Hematology  PCP:  [name], MD [address] [place] [address] [phone]   REASON FOR VISIT: Follow-up for metastatic lung cancer and iron deficiency state.  CURRENT THERAPY: Observation AND intermittent iron infusions   BRIEF ONCOLOGIC HISTORY:  Oncology History  Adenocarcinoma of lung ([place])  12/21/2010 Initial Diagnosis   S/P left lower lobe resection for a well-differentiated Stage IB bronchoalveolar cancer. 0/14 lymph nodes.  No perineural invasion or LVI.  negative margins.   06/05/2016 Imaging   Although the previously described ground-glass attenuation nodule in the superior segment of the right lower lobe measures slightly larger on today's examination than prior studies, this is favored to be technique related. Today's study should serve as a baseline for future followup examinations. At this time, there is no central solid component to this lesion. No new nodules are noted. 2. Stable postoperative scarring in the left lower lobe and post infectious scarring throughout the visualize lung bases, as above. 3. Areas of cylindrical bronchiectasis throughout the lung bases bilaterally    05/09/2017 Imaging   IMPRESSION: 1. Status post left lower lobe wedge resection. No findings to suggest local recurrence of disease or definite metastatic disease in the thorax. Previously noted ground-glass attenuation nodule in the superior segment of the right lower lobe appears slightly smaller than the prior study, suggesting a benign etiology. 2. Diffuse bronchial wall thickening with moderate to severe centrilobular and paraseptal emphysema; imaging findings suggestive of underlying COPD. 3. Aortic atherosclerosis, in addition to left main and left circumflex coronary artery disease. Assessment for potential risk factor modification, dietary therapy or  pharmacologic therapy may be warranted, if clinically indicated. 4. Increasing anterior pericardial fluid and/or thickening. This is unlikely to be of hemodynamic significance at this time. No associated pericardial calcification. 5. New (but non acute) compression fracture of T6 with approximately 90% loss of anterior vertebral body height, in addition to multiple other chronic compression fractures of the visualize the thoracolumbar spine.      CANCER STAGING: Cancer Staging Adenocarcinoma of lung [facility]) Staging form: Lung, AJCC 7th Edition - Clinical: Stage IB - Signed by Baird Cancer, PA on 08/01/2012    INTERVAL HISTORY:  [name] 79 y.o. female seen for follow-up of lung cancer.  Appetite and energy levels are 75%.  Chronic cough is stable.  Denies any chest pains.  Numbness in hands and feet is also stable.  Denies any night sweats or weight loss.  She had PET scan done last week.  She has brain MRI scheduled next week.    REVIEW OF SYSTEMS:  Review of Systems  Respiratory: Positive for cough.   Neurological: Positive for numbness.  All other systems reviewed and are negative.    PAST MEDICAL/SURGICAL HISTORY:  Past Medical History:  Diagnosis Date  . Adenocarcinoma of lung ([place]) 12/2010   left lung/surg only  . Allergic rhinitis   . 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 Anxiety disorder   . Aortic aneurysm ([place])   . Brain metastasis ([place])    lung cancer s/p resection 7/20  . Complication of anesthesia 2011   bloodpressure dropped during colonoscopy, not problems since  . Compression fracture   . COPD (chronic obstructive pulmonary disease) ([place])   . Depression   . Diastolic dysfunction   . Diverticulosis   . Dysphagia   .  Emphysema lung ([place]) 11/08/2014  . Headache   . Hiatal hernia   . History of kidney stones   . Hypothyroidism   . IBS (irritable bowel syndrome)   . Iron deficiency anemia due to chronic blood loss  12/05/2016  . On home O2 12/15/2013   chronic hypoxia  . Pneumonia   . PUD (peptic ulcer disease)    55yrs  . Shortness of breath    with exertion  . SIADH (syndrome of inappropriate ADH production) ([place])   . Trigeminal neuralgia    Past Surgical History:  Procedure Laterality Date  . ABDOMINAL HYSTERECTOMY    . APPLICATION OF CRANIAL NAVIGATION N/A 03/18/2019   Procedure: APPLICATION OF CRANIAL NAVIGATION;  Surgeon: Consuella Lose, MD;  Location: [facility];  Service: Neurosurgery;  Laterality: N/A;  . BACK SURGERY  January 13, 2014  . CATARACT EXTRACTION    . CATARACT EXTRACTION W/PHACO  09/02/2012   Procedure: CATARACT EXTRACTION PHACO AND INTRAOCULAR LENS PLACEMENT (IOC);  Surgeon: Elta Guadeloupe T. Gershon Crane, MD;  [facility];  Service: Ophthalmology;  Laterality: Left;  CDE:10.35  . CHOLECYSTECTOMY    . 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 COLONOSCOPY N/A 11/23/2014   Dr. Oneida Alar: moderate diverticulosis, hemorrhoids, redundant colon. next TCS in 10-15 years.   . CRANIOTOMY Left 03/18/2019   Procedure: Left stereotactic craniotomy for tumor resection;  Surgeon: Consuella Lose, MD;  Location: [facility];  Service: Neurosurgery;  Laterality: Left;  Left stereotactic craniotomy for tumor resection  . ENDARTERECTOMY Right 08/21/2013   Procedure: RIGHT CAROTID ANEURYSM RESECTION;  Surgeon: Rosetta Posner, MD;  Location: [facility];  Service: Vascular;  Laterality: Right;  . ESOPHAGEAL DILATION  02/24/2018   Procedure: ESOPHAGEAL DILATION;  Surgeon: Danie Binder, MD;  [facility];  Service: Endoscopy;;  . ESOPHAGOGASTRODUODENOSCOPY  11/13/2009   w/dilation to 6mm, gastric ulceration (H.Pylori) s/p treatment  . ESOPHAGOGASTRODUODENOSCOPY  11/21/2009   distal esophageal web, gastritis  . ESOPHAGOGASTRODUODENOSCOPY  03/14/12   DDU:[id number] in the distal esophagus/Mild gastritis/small HH. + H.pylori, prescribed Pylera. Finished treatment.   .  ESOPHAGOGASTRODUODENOSCOPY N/A 12/13/2017   Procedure: ESOPHAGOGASTRODUODENOSCOPY (EGD);  Surgeon: Danie Binder, MD;  [facility];  Service: Endoscopy;  Laterality: N/A;  8:30am  . ESOPHAGOGASTRODUODENOSCOPY N/A 02/24/2018   Procedure: ESOPHAGOGASTRODUODENOSCOPY (EGD);  Surgeon: Danie Binder, MD;  [facility];  Service: Endoscopy;  Laterality: N/A;  11:00am  . fatty tumor removal from lt groin    . FRACTURE SURGERY Left    hand and left ring finger  . GIVENS CAPSULE STUDY N/A 12/26/2017   Procedure: GIVENS CAPSULE STUDY;  Surgeon: Danie Binder, MD;  [facility];  Service: Endoscopy;  Laterality: N/A;  7:30am  . GIVENS CAPSULE STUDY N/A 02/24/2018   Procedure: GIVENS CAPSULE STUDY;  Surgeon: Danie Binder, MD;  [facility];  Service: Endoscopy;  Laterality: N/A;  . KNEE SURGERY     patella tendon repair june 2019 [facility]  . LUNG CANCER SURGERY  12/2010   Left VATS, minithoracotomy, LLL superior segmentectomy  . ORIF PATELLA Right 03/18/2018   Procedure: OPEN REDUCTION INTERNAL (ORIF) FIXATION RIGHT PATELLA;  Surgeon: Carole Civil, MD;  [facility];  Service: Orthopedics;  Laterality: Right;  . ORIF WRIST FRACTURE Left 04/09/2014   Procedure: OPEN REDUCTION INTERNAL FIXATION (ORIF) WRIST FRACTURE;  Surgeon: Renette Butters, MD;  Location: [facility];  Service: Orthopedics;  Laterality: Left;  .  PERCUTANEOUS PINNING Left 04/09/2014   Procedure: PERCUTANEOUS PINNING EXTREMITY;  Surgeon: Renette Butters, MD;  Location: [facility];  Service: Orthopedics;  Laterality: Left;  . SAVORY DILATION N/A 12/13/2017   Procedure: SAVORY DILATION;  Surgeon: Danie Binder, MD;  [facility];  Service: Endoscopy;  Laterality: N/A;  . TUBAL LIGATION    . vocal cord surgery  02/06/2011   laryngoscopy with bilateral vocal cord Radiesse injection for vocal cord paralysis  . YAG LASER APPLICATION Left 02/72/5366   Procedure: YAG LASER APPLICATION;   Surgeon: Rutherford Guys, MD;  [facility];  Service: Ophthalmology;  Laterality: Left;     SOCIAL HISTORY:  Social History   Socioeconomic History  . Marital status: Divorced    Spouse name: Not on file  . Number of children: Not on file  . Years of education: Not on file  . Highest education level: Not on file  Occupational History  . Not on file  Tobacco Use  . Smoking status: Former Smoker    Packs/day: 0.50    Years: 20.00    Pack years: 10.00    Types: Cigarettes    Quit date: 12/21/2010    Years since quitting: 8.8  . Smokeless tobacco: Never Used  . Tobacco comment: smoking cessation info given and reviewed   Substance and Sexual Activity  . Alcohol use: Yes    Alcohol/week: 0.0 standard drinks    Comment: seldom  . Drug use: No  . Sexual activity: Yes    Birth control/protection: Surgical  Other Topics Concern  . Not on file  Social History Narrative  . Not on file   Social Determinants of Health   Financial Resource Strain:   . Difficulty of Paying Living Expenses: Not on file  Food Insecurity:   . Worried About Charity fundraiser in the Last Year: Not on file  . Ran Out of Food in the Last Year: Not on file  Transportation Needs:   . Lack of Transportation (Medical): Not on file  . Lack of Transportation (Non-Medical): Not on file  Physical Activity:   . Days of Exercise per Week: Not on file  . Minutes of Exercise per Session: Not on file  Stress:   . Feeling of Stress : Not on file  Social Connections:   . Frequency of Communication with Friends and Family: Not on file  . Frequency of Social Gatherings with Friends and Family: Not on file  . Attends Religious Services: Not on file  . Active Member of Clubs or Organizations: Not on file  . Attends Archivist Meetings: Not on file  . Marital Status: Not on file  Intimate Partner Violence:   . Fear of Current or Ex-Partner: Not on file  . Emotionally Abused: Not on file  . Physically  Abused: Not on file  . Sexually Abused: Not on file    FAMILY HISTORY:  Family History  Problem Relation Age of Onset  . Pulmonary embolism Mother   . Heart attack Father   . Hypertension Father   . Liver cancer Sister   . Cancer Sister   . Cancer Brother   . Cancer Daughter   . Colon cancer Neg Hx     CURRENT MEDICATIONS:  Outpatient Encounter Medications as of 09/16/2019  Medication Sig  . ALPRAZolam (XANAX) 0.5 MG tablet TAKE (1) TABLET BY MOUTH TWICE A DAY AS NEEDED. (Patient taking differently: Take 0.5 mg by mouth at bedtime. )  .  citalopram (CELEXA) 40 MG tablet Take 0.5 tablets (20 mg total) by mouth at bedtime.  Marland Kitchen denosumab (PROLIA) 60 MG/ML SOSY injection Inject 60 mg into the skin every 6 (six) months.  . ferrous sulfate 325 (65 FE) MG tablet Take 325 mg by mouth daily with breakfast.  . fluticasone (FLONASE) 50 MCG/ACT nasal spray Place 2 sprays into both nostrils daily.  . Fluticasone-Umeclidin-Vilant (TRELEGY ELLIPTA) 100-62.5-25 MCG/INH AEPB Inhale 1 Inhaler into the lungs daily.  Marland Kitchen levocetirizine (XYZAL) 5 MG tablet TAKE ONE TABLET BY MOUTH ONCE DAILY.  Marland Kitchen levothyroxine (SYNTHROID) 50 MCG tablet Take 1 tablet (50 mcg total) by mouth daily before breakfast.  . lubiprostone (AMITIZA) 24 MCG capsule Take 24 mcg by mouth 2 (two) times daily with a meal.  . Multiple Vitamin (MULTIVITAMIN WITH MINERALS) TABS tablet Take 1 tablet by mouth daily.  . pantoprazole (PROTONIX) 40 MG tablet Take 1 tablet (40 mg total) by mouth daily before breakfast.  . albuterol (VENTOLIN HFA) 108 (90 Base) MCG/ACT inhaler Inhale 2 puffs into the lungs every 6 (six) hours as needed for wheezing or shortness of breath.  Jearl Klinefelter ELLIPTA 62.5-25 MCG/INH AEPB INHALE 1 PUFF INTO THE LUNGS DAILY.  . bisacodyl (BISACODYL) 5 MG EC tablet Take 1 tablet (5 mg total) by mouth daily as needed for moderate constipation.  . dicyclomine (BENTYL) 20 MG tablet Take 1 tablet (20 mg total) by mouth every 6 (six)  hours.  Marland Kitchen MELATONIN PO Take 1 capsule by mouth daily as needed (sleep).  . ondansetron (ZOFRAN ODT) 4 MG disintegrating tablet Take 1 tablet (4 mg total) by mouth every 4 (four) hours as needed for nausea or vomiting.  . sennosides-docusate sodium (SENOKOT-S) 8.6-50 MG tablet Take 1 tablet by mouth daily.  Marland Kitchen tiZANidine (ZANAFLEX) 4 MG tablet TAKE (1) TABLET BY MOUTH EVERY SIX HOURS AS NEEDED FOR MUSCLE SPASMS. (Patient not taking: Reported on 09/25/2019)  . [DISCONTINUED] cefdinir (OMNICEF) 300 MG capsule Take 1 capsule (300 mg total) by mouth 2 (two) times daily.   No facility-administered encounter medications on file as of 09/16/2019.    ALLERGIES:  Allergies  Allergen Reactions  . Ciprofloxacin Hcl Hives  . Sulfonamide Derivatives Hives and Rash  . Iohexol Other (See Comments)    UNSPECIFIED REACTION  Desc: Per [facility], pt is allergic to IV contrast, no type of reaction was available. Pt cannot remember but first reacted around 15 yrs ago from an IVP. Notes were date from 2009 at urology center., Onset Date: [id number]   . Prozac [Fluoxetine Hcl] Rash     PHYSICAL EXAM:  ECOG Performance status: 1  Vitals:   09/16/19 1153  BP: 91/70  Pulse: 88  Resp: 20  Temp: (!) 97.1 F (36.2 C)  SpO2: 94%   Filed Weights   09/16/19 1153  Weight: 107 lb (48.5 kg)    Physical Exam Constitutional:      Appearance: Normal appearance. She is normal weight.  Cardiovascular:     Rate and Rhythm: Normal rate and regular rhythm.     Heart sounds: Normal heart sounds.  Pulmonary:     Effort: Pulmonary effort is normal.     Breath sounds: Normal breath sounds.  Abdominal:     General: There is no distension.     Palpations: Abdomen is soft. There is no mass.  Musculoskeletal:        General: Normal range of motion.  Skin:    General: Skin is warm and dry.  Neurological:  Mental Status: She is alert and oriented to person, place, and time. Mental status is at baseline.    Psychiatric:        Mood and Affect: Mood normal.        Behavior: Behavior normal.      LABORATORY DATA:  I have reviewed the labs as listed.  CBC    Component Value Date/Time   WBC 8.2 06/04/2019 1228   RBC 4.44 06/04/2019 1228   HGB 13.0 06/04/2019 1228   HCT 40.4 06/04/2019 1228   PLT 443 (H) 06/04/2019 1228   MCV 91.0 06/04/2019 1228   MCH 29.3 06/04/2019 1228   MCHC 32.2 06/04/2019 1228   RDW 13.2 06/04/2019 1228   LYMPHSABS 1.2 06/04/2019 1228   MONOABS 0.6 06/04/2019 1228   EOSABS 0.1 06/04/2019 1228   BASOSABS 0.0 06/04/2019 1228   CMP Latest Ref Rng & Units 06/04/2019 05/27/2019 05/26/2019  Glucose 70 - 99 mg/dL 102(H) 111(H) 108(H)  BUN 8 - 23 mg/dL 12 14 18   Creatinine 0.44 - 1.00 mg/dL 0.46 0.40(L) 0.48  Sodium 135 - 145 mmol/L 133(L) 134(L) 133(L)  Potassium 3.5 - 5.1 mmol/L 3.9 4.0 3.8  Chloride 98 - 111 mmol/L 98 97(L) 98  CO2 22 - 32 mmol/L 24 26 23   Calcium 8.9 - 10.3 mg/dL 9.5 9.7 9.4  Total Protein 6.5 - 8.1 g/dL 7.2 - -  Total Bilirubin 0.3 - 1.2 mg/dL 0.6 - -  Alkaline Phos 38 - 126 U/L 127(H) - -  AST 15 - 41 U/L 13(L) - -  ALT 0 - 44 U/L 14 - -       DIAGNOSTIC IMAGING:  I have independently reviewed the scans and discussed with the patient.    ASSESSMENT & PLAN:   Adenocarcinoma of lung ([facility]) 1.  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 - She was being monitored with intermittent scans. - She presented with expressive aphasia to the ER on 03/03/2019 along with right-sided weakness.  She was found to have multiple brain metastasis and underwent SRS on 03/17/2019. -He also underwent stereotactic left parietal craniotomy for resection of tumor on 03/18/2019, biopsy consistent with adenocarcinoma lung primary. -We reviewed results of PET scan from 09/14/2019 which did not show any evidence of recurrence or metastatic disease. -She  has MRI of the brain scheduled on 09/24/2019. -I plan to see her back in 6 months for follow-up with repeat CT scan.  2.  Iron deficiency state: -Last Feraheme infusion was on 11/21/2017. - We reviewed her CBC from 06/04/2019.  Hemoglobin is 13.0.     Orders placed this encounter:  Orders Placed This Encounter  Procedures  . CT Chest W Contrast      Derek Jack, MD [place] [phone]

## 2019-10-30 ENCOUNTER — Other Ambulatory Visit: Payer: Self-pay | Admitting: Family Medicine

## 2019-10-30 ENCOUNTER — Other Ambulatory Visit: Payer: Self-pay

## 2019-10-30 ENCOUNTER — Ambulatory Visit (INDEPENDENT_AMBULATORY_CARE_PROVIDER_SITE_OTHER): Payer: PPO | Admitting: Family Medicine

## 2019-10-30 DIAGNOSIS — Z9189 Other specified personal risk factors, not elsewhere classified: Secondary | ICD-10-CM

## 2019-10-30 MED ORDER — NITROFURANTOIN MONOHYD MACRO 100 MG PO CAPS: 100.0000 mg | ORAL_CAPSULE | Freq: Two times a day (BID) | ORAL | 0 refills | Status: DC

## 2019-10-30 NOTE — Progress Notes (Signed)
Subjective:    Patient ID: [name], female    DOB: [birth year], 79 y.o.   MRN: [id number]  HPI Patient is being seen today as a telephone visit.  Phone call began at 847.  Phone call concluded 857.  Patient consents to be seen via telephone.  Patient potentially has been exposed to Covid secondarily.  Her neighbor went to church on Sunday and 8 lunch with her niece who has tested positive for Covid after the fact.  Her neighbor has been coming by a visiting the patient every day this week.  Her neighbor is not demonstrating any symptoms at present.  The patient denies any cough or rhinorrhea or shortness of breath or chest pain or fever or chills.  She denies any loss in her sense of smell or taste.  She also has increased urge to urinate over the last 2 days.  She also has cloudy urine however she denies any dysuria.  She denies any hesitancy.  She denies any frequency. Past Medical History:  Diagnosis Date  . Adenocarcinoma of lung ([place]) 12/2010   left lung/surg only  . Allergic rhinitis   . 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 Anxiety disorder   . Aortic aneurysm ([place])   . Brain metastasis ([place])    lung cancer s/p resection 7/20  . Complication of anesthesia 2011   bloodpressure dropped during colonoscopy, not problems since  . Compression fracture   . COPD (chronic obstructive pulmonary disease) ([place])   . Depression   . Diastolic dysfunction   . Diverticulosis   . Dysphagia   . Emphysema lung ([place]) 11/08/2014  . Headache   . Hiatal hernia   . History of kidney stones   . Hypothyroidism   . IBS (irritable bowel syndrome)   . Iron deficiency anemia due to chronic blood loss 12/05/2016  . On home O2 12/15/2013   chronic hypoxia  . Pneumonia   . PUD (peptic ulcer disease)    63yrs  . Shortness of breath    with exertion  . SIADH (syndrome of inappropriate ADH production) ([place])   . Trigeminal neuralgia    Past Surgical History:  Procedure  Laterality Date  . ABDOMINAL HYSTERECTOMY    . APPLICATION OF CRANIAL NAVIGATION N/A 03/18/2019   Procedure: APPLICATION OF CRANIAL NAVIGATION;  Surgeon: Consuella Lose, MD;  Location: [facility];  Service: Neurosurgery;  Laterality: N/A;  . BACK SURGERY  January 13, 2014  . CATARACT EXTRACTION    . CATARACT EXTRACTION W/PHACO  09/02/2012   Procedure: CATARACT EXTRACTION PHACO AND INTRAOCULAR LENS PLACEMENT (IOC);  Surgeon: Elta Guadeloupe T. Gershon Crane, MD;  [facility];  Service: Ophthalmology;  Laterality: Left;  CDE:10.35  . CHOLECYSTECTOMY    . 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 COLONOSCOPY N/A 11/23/2014   Dr. Oneida Alar: moderate diverticulosis, hemorrhoids, redundant colon. next TCS in 10-15 years.   . CRANIOTOMY Left 03/18/2019   Procedure: Left stereotactic craniotomy for tumor resection;  Surgeon: Consuella Lose, MD;  Location: [facility];  Service: Neurosurgery;  Laterality: Left;  Left stereotactic craniotomy for tumor resection  . ENDARTERECTOMY Right 08/21/2013   Procedure: RIGHT CAROTID ANEURYSM RESECTION;  Surgeon: Rosetta Posner, MD;  Location: [facility];  Service: Vascular;  Laterality: Right;  . ESOPHAGEAL DILATION  02/24/2018   Procedure: ESOPHAGEAL DILATION;  Surgeon: Danie Binder, MD;  [facility];  Service: Endoscopy;;  .  ESOPHAGOGASTRODUODENOSCOPY  11/13/2009   w/dilation to 54mm, gastric ulceration (H.Pylori) s/p treatment  . ESOPHAGOGASTRODUODENOSCOPY  11/21/2009   distal esophageal web, gastritis  . ESOPHAGOGASTRODUODENOSCOPY  03/14/12   WUJ:[id number] in the distal esophagus/Mild gastritis/small HH. + H.pylori, prescribed Pylera. Finished treatment.   . ESOPHAGOGASTRODUODENOSCOPY N/A 12/13/2017   Procedure: ESOPHAGOGASTRODUODENOSCOPY (EGD);  Surgeon: Danie Binder, MD;  [facility];  Service: Endoscopy;  Laterality: N/A;  8:30am  . ESOPHAGOGASTRODUODENOSCOPY N/A 02/24/2018   Procedure: ESOPHAGOGASTRODUODENOSCOPY (EGD);   Surgeon: Danie Binder, MD;  [facility];  Service: Endoscopy;  Laterality: N/A;  11:00am  . fatty tumor removal from lt groin    . FRACTURE SURGERY Left    hand and left ring finger  . GIVENS CAPSULE STUDY N/A 12/26/2017   Procedure: GIVENS CAPSULE STUDY;  Surgeon: Danie Binder, MD;  [facility];  Service: Endoscopy;  Laterality: N/A;  7:30am  . GIVENS CAPSULE STUDY N/A 02/24/2018   Procedure: GIVENS CAPSULE STUDY;  Surgeon: Danie Binder, MD;  [facility];  Service: Endoscopy;  Laterality: N/A;  . KNEE SURGERY     patella tendon repair june 2019 [facility]  . LUNG CANCER SURGERY  12/2010   Left VATS, minithoracotomy, LLL superior segmentectomy  . ORIF PATELLA Right 03/18/2018   Procedure: OPEN REDUCTION INTERNAL (ORIF) FIXATION RIGHT PATELLA;  Surgeon: Carole Civil, MD;  [facility];  Service: Orthopedics;  Laterality: Right;  . ORIF WRIST FRACTURE Left 04/09/2014   Procedure: OPEN REDUCTION INTERNAL FIXATION (ORIF) WRIST FRACTURE;  Surgeon: Renette Butters, MD;  Location: [facility];  Service: Orthopedics;  Laterality: Left;  . PERCUTANEOUS PINNING Left 04/09/2014   Procedure: PERCUTANEOUS PINNING EXTREMITY;  Surgeon: Renette Butters, MD;  Location: [place] [facility];  Service: Orthopedics;  Laterality: Left;  . SAVORY DILATION N/A 12/13/2017   Procedure: SAVORY DILATION;  Surgeon: Danie Binder, MD;  [facility];  Service: Endoscopy;  Laterality: N/A;  . TUBAL LIGATION    . vocal cord surgery  02/06/2011   laryngoscopy with bilateral vocal cord Radiesse injection for vocal cord paralysis  . YAG LASER APPLICATION Left 95/62/1308   Procedure: YAG LASER APPLICATION;  Surgeon: Rutherford Guys, MD;  [facility];  Service: Ophthalmology;  Laterality: Left;   Current Outpatient Medications on File Prior to Visit  Medication Sig Dispense Refill  . albuterol (VENTOLIN HFA) 108 (90 Base) MCG/ACT inhaler Inhale 2 puffs into the lungs every 6 (six) hours  as needed for wheezing or shortness of breath. 18 g 2  . ALPRAZolam (XANAX) 0.5 MG tablet TAKE (1) TABLET BY MOUTH TWICE A DAY AS NEEDED. (Patient taking differently: Take 0.5 mg by mouth at bedtime. ) 60 tablet 0  . ANORO ELLIPTA 62.5-25 MCG/INH AEPB INHALE 1 PUFF INTO THE LUNGS DAILY. 60 each 5  . bisacodyl (BISACODYL) 5 MG EC tablet Take 1 tablet (5 mg total) by mouth daily as needed for moderate constipation. 30 tablet 0  . citalopram (CELEXA) 40 MG tablet Take 0.5 tablets (20 mg total) by mouth at bedtime.    Marland Kitchen denosumab (PROLIA) 60 MG/ML SOSY injection Inject 60 mg into the skin every 6 (six) months. 1 mL 0  . dicyclomine (BENTYL) 20 MG tablet Take 1 tablet (20 mg total) by mouth every 6 (six) hours. 90 tablet 3  . ferrous sulfate 325 (65 FE) MG tablet Take 325 mg by mouth daily with breakfast.    . fluticasone (FLONASE) 50 MCG/ACT  nasal spray Place 2 sprays into both nostrils daily. 16 g 6  . Fluticasone-Umeclidin-Vilant (TRELEGY ELLIPTA) 100-62.5-25 MCG/INH AEPB Inhale 1 Inhaler into the lungs daily. 1 each 5  . levocetirizine (XYZAL) 5 MG tablet TAKE ONE TABLET BY MOUTH ONCE DAILY. 30 tablet 5  . levothyroxine (SYNTHROID) 50 MCG tablet Take 1 tablet (50 mcg total) by mouth daily before breakfast.    . lubiprostone (AMITIZA) 24 MCG capsule Take 24 mcg by mouth 2 (two) times daily with a meal.    . MELATONIN PO Take 1 capsule by mouth daily as needed (sleep).    . Multiple Vitamin (MULTIVITAMIN WITH MINERALS) TABS tablet Take 1 tablet by mouth daily.    . ondansetron (ZOFRAN ODT) 4 MG disintegrating tablet Take 1 tablet (4 mg total) by mouth every 4 (four) hours as needed for nausea or vomiting.    . pantoprazole (PROTONIX) 40 MG tablet Take 1 tablet (40 mg total) by mouth daily before breakfast. 90 tablet 3  . sennosides-docusate sodium (SENOKOT-S) 8.6-50 MG tablet Take 1 tablet by mouth daily.    Marland Kitchen tiZANidine (ZANAFLEX) 4 MG tablet TAKE (1) TABLET BY MOUTH EVERY SIX HOURS AS NEEDED FOR  MUSCLE SPASMS. (Patient not taking: Reported on 09/25/2019) 30 tablet 2   No current facility-administered medications on file prior to visit.   Allergies  Allergen Reactions  . Ciprofloxacin Hcl Hives  . Sulfonamide Derivatives Hives and Rash  . Iohexol Other (See Comments)    UNSPECIFIED REACTION  Desc: Per [facility], pt is allergic to IV contrast, no type of reaction was available. Pt cannot remember but first reacted around 15 yrs ago from an IVP. Notes were date from 2009 at urology center., [place] Date: [id number]   . Prozac [Fluoxetine Hcl] Rash   Social History   Socioeconomic History  . Marital status: Divorced    Spouse name: Not on file  . Number of children: Not on file  . Years of education: Not on file  . Highest education level: Not on file  Occupational History  . Not on file  Tobacco Use  . Smoking status: Former Smoker    Packs/day: 0.50    Years: 20.00    Pack years: 10.00    Types: Cigarettes    Quit date: 12/21/2010    Years since quitting: 8.8  . Smokeless tobacco: Never Used  . Tobacco comment: smoking cessation info given and reviewed   Substance and Sexual Activity  . Alcohol use: Yes    Alcohol/week: 0.0 standard drinks    Comment: seldom  . Drug use: No  . Sexual activity: Yes    Birth control/protection: Surgical  Other Topics Concern  . Not on file  Social History Narrative  . Not on file   Social Determinants of Health   Financial Resource Strain:   . Difficulty of Paying Living Expenses: Not on file  Food Insecurity:   . Worried About Charity fundraiser in the Last Year: Not on file  . Ran Out of Food in the Last Year: Not on file  Transportation Needs:   . Lack of Transportation (Medical): Not on file  . Lack of Transportation (Non-Medical): Not on file  Physical Activity:   . Days of Exercise per Week: Not on file  . Minutes of Exercise per Session: Not on file  Stress:   . Feeling of Stress : Not on file  Social  Connections:   . Frequency of Communication with Friends and Family: Not  on file  . Frequency of Social Gatherings with Friends and Family: Not on file  . Attends Religious Services: Not on file  . Active Member of Clubs or Organizations: Not on file  . Attends Archivist Meetings: Not on file  . Marital Status: Not on file  Intimate Partner Violence:   . Fear of Current or Ex-Partner: Not on file  . Emotionally Abused: Not on file  . Physically Abused: Not on file  . Sexually Abused: Not on file      Review of Systems  All other systems reviewed and are negative.      Objective:   Physical Exam        Assessment & Plan:  At increased risk of exposure to COVID-19 virus  I explained to the patient how she could be tested for Covid.  At the present time she is quarantining on her own for 14 days.  I explained that if she develops any symptoms at all she could call [phone].  She could also drive to my office and I will be glad to come out to the parking lot and swab her and test her for Covid.  At the present time however she has no symptoms and therefore I do not feel that she requires any other medical treatment other than quarantine.  Her urine symptoms could just be due to dehydration however she also has a complicating history of interstitial cystitis and urinary tract infections would also be on the differential diagnosis.  Her symptoms are mild and so we will not treat them at the present time.  I encouraged her to drink more water to help flush her bladder.  However if she develops dysuria or frequency or hematuria or fever I would like her to start Macrobid 100 mg p.o. twice daily for 7 days for possible UTI.

## 2019-11-06 IMAGING — DX DG ABDOMEN 1V
2 series · 2 of 2 positions shown · non-contrast
Comparison: No recent studies in PACs

CLINICAL DATA: Evaluate patient for retained electronic Givens
capsule.

EXAM:
ABDOMEN - 1 VIEW

[abdomen kub (1 of 2)]
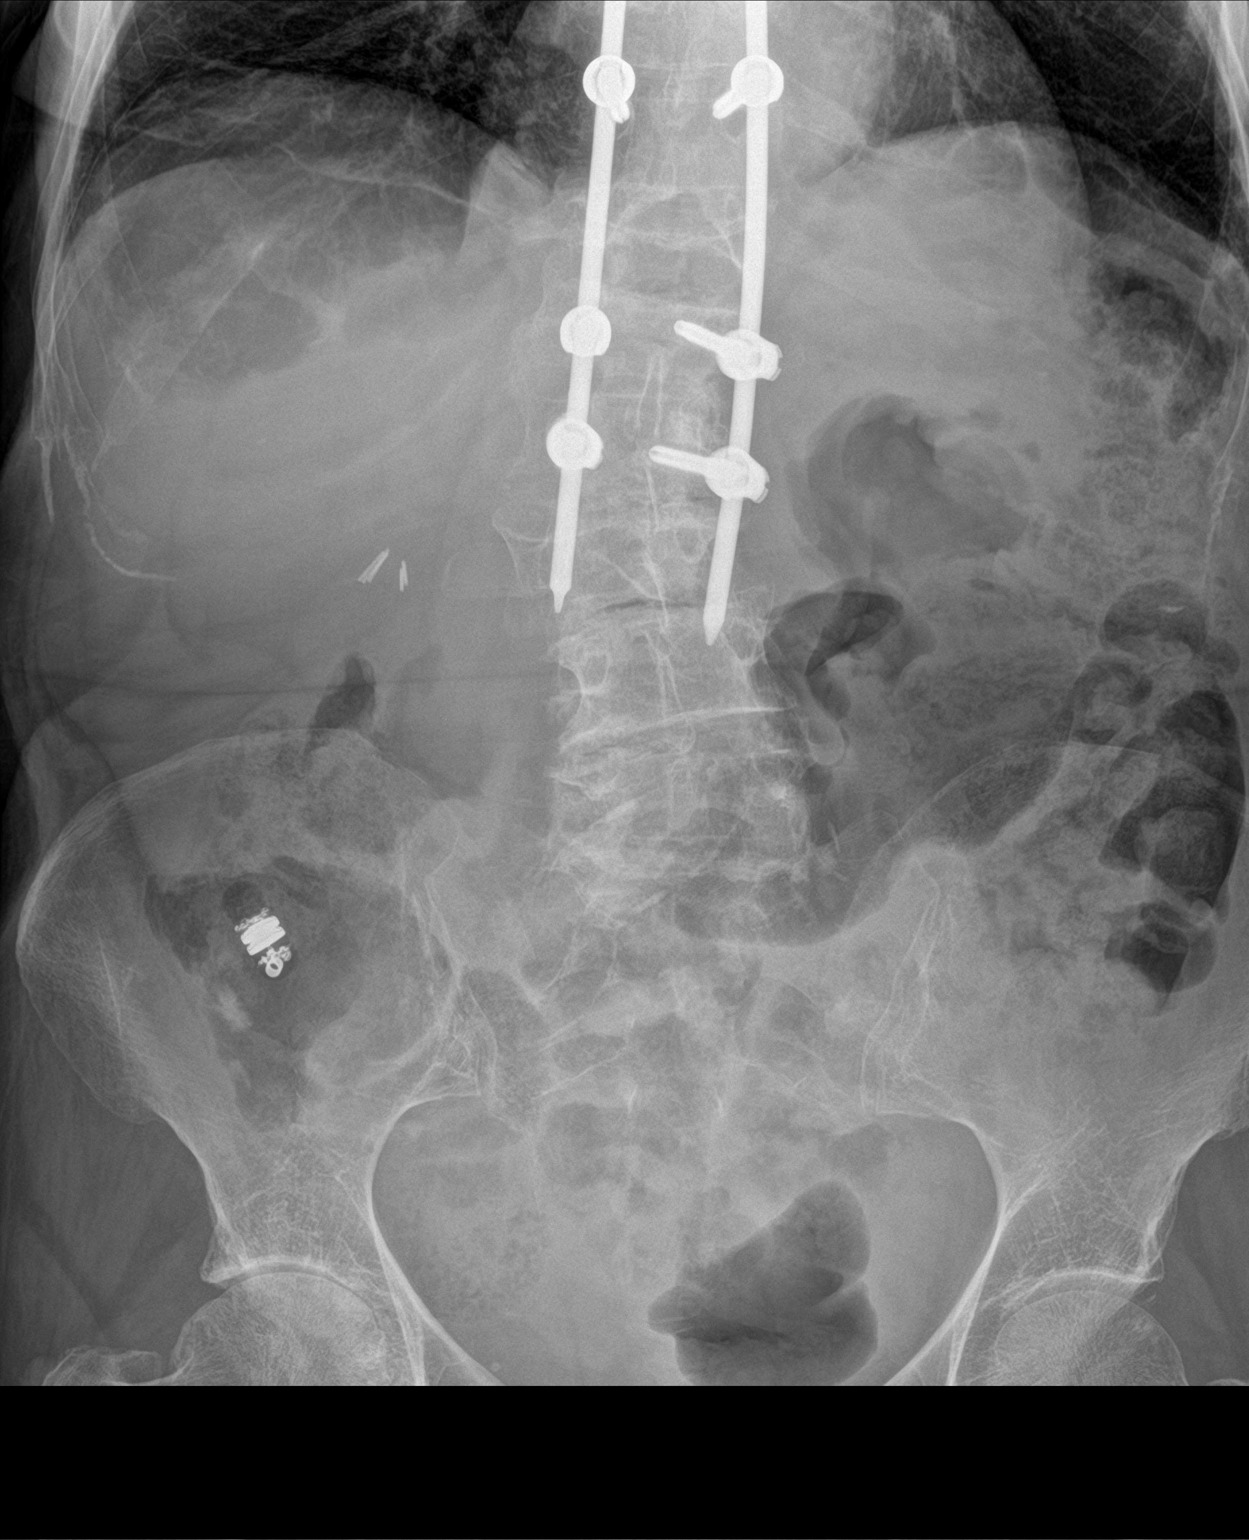

[abdomen kub (2 of 2)]
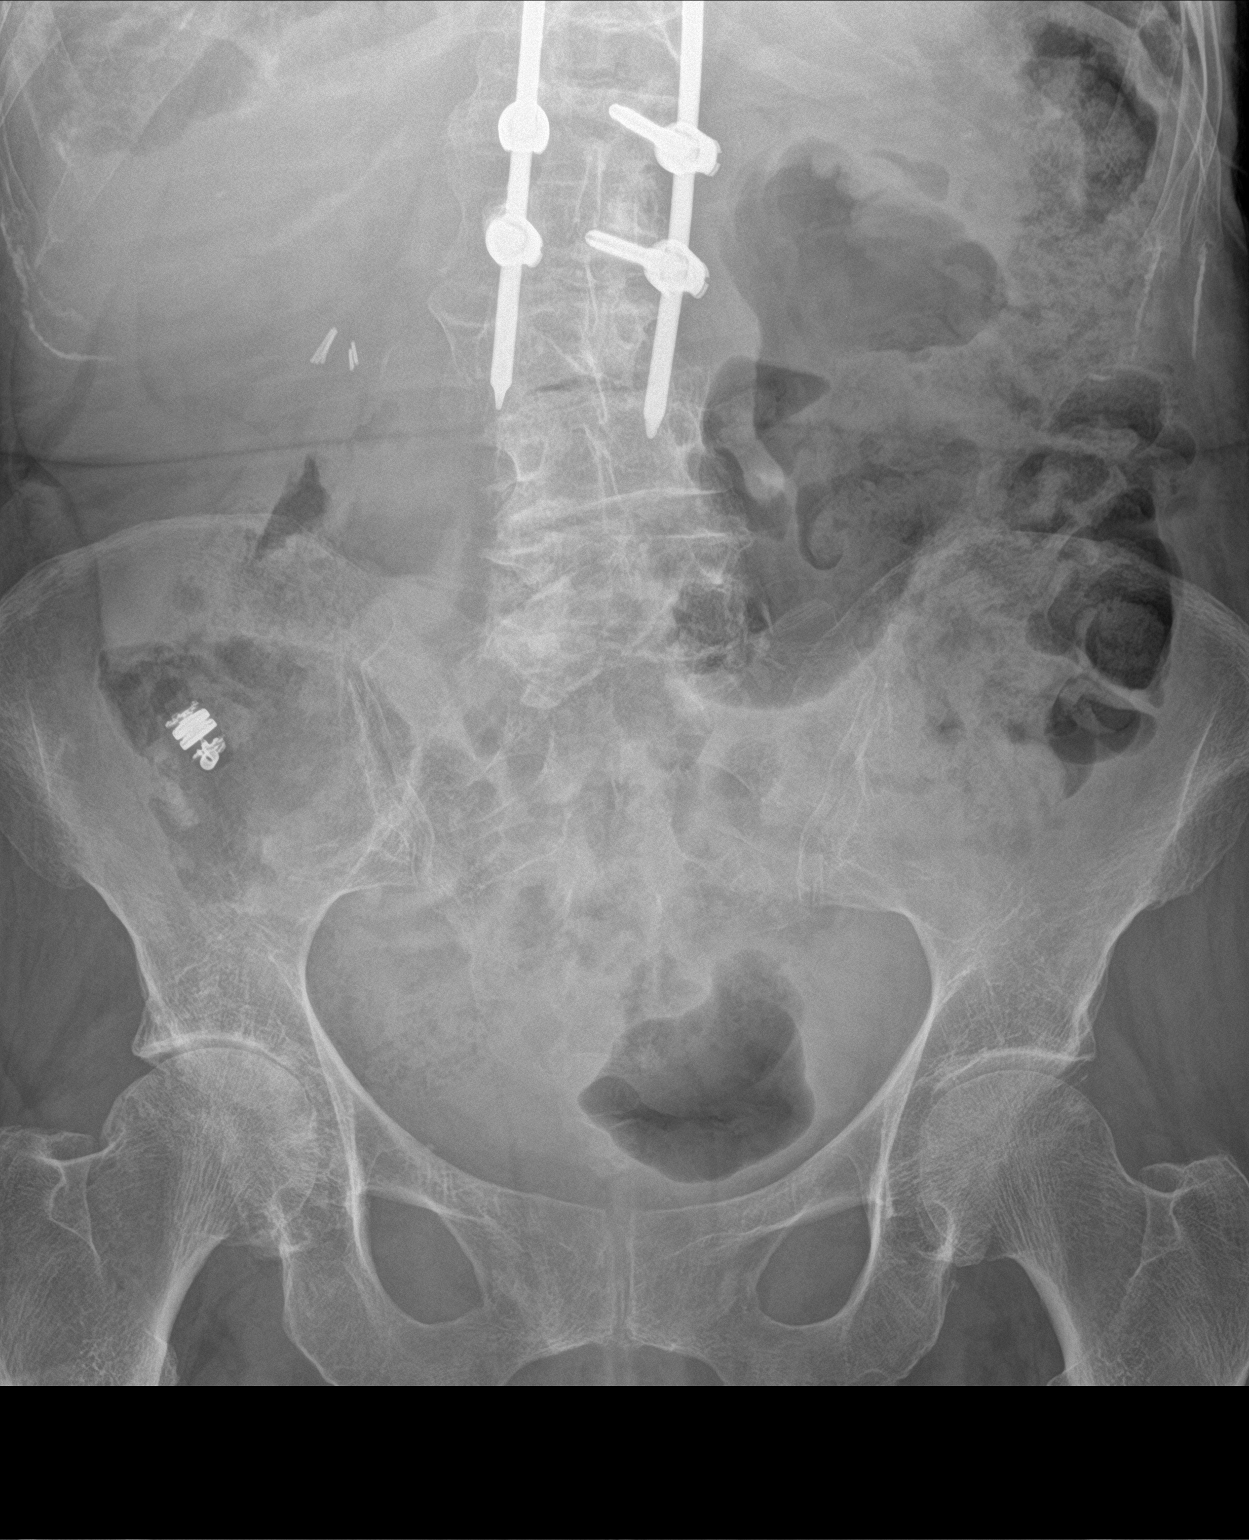

[2 of 2 positions shown; findings below may reference images not displayed]

FINDINGS: The given scaphoid to Gargi visible projecting over the right iliac
crest likely in the cecum. The bowel gas pattern is within the
limits of normal with moderately increased stool burden in the
colon. There are surgical clips in the gallbladder fossa. There
Harrington rods and pedicle screws extending from the lower thoracic
spine to L2. There is moderate degenerative narrowing of the right
hip joint space.
IMPRESSION: The given its capsule projects in the right lower quadrant of the
abdomen likely in the cecum. Moderately increased colonic stool
burden may reflect constipation in the appropriate clinical setting.

## 2019-11-09 ENCOUNTER — Ambulatory Visit (INDEPENDENT_AMBULATORY_CARE_PROVIDER_SITE_OTHER): Payer: PPO | Admitting: Family Medicine

## 2019-11-09 ENCOUNTER — Encounter: Payer: Self-pay | Admitting: Family Medicine

## 2019-11-09 ENCOUNTER — Other Ambulatory Visit: Payer: Self-pay | Admitting: Family Medicine

## 2019-11-09 ENCOUNTER — Other Ambulatory Visit: Payer: Self-pay

## 2019-11-09 DIAGNOSIS — R3915 Urgency of urination: Secondary | ICD-10-CM

## 2019-11-09 DIAGNOSIS — R300 Dysuria: Secondary | ICD-10-CM

## 2019-11-09 MED ORDER — CEPHALEXIN 500 MG PO CAPS: 500.0000 mg | ORAL_CAPSULE | Freq: Three times a day (TID) | ORAL | 0 refills | Status: DC

## 2019-11-09 NOTE — Progress Notes (Signed)
Subjective:    Patient ID: [name], female    DOB: 09-Aug-1941, 79 y.o.   MRN: [id number]  HPI Patient is being seen today as a telephone visit.  She consents to be seen via telephone.  Phone call began at 915.  Phone call concluded at 926.  Patient has recently finished Macrobid.  She stopped the antibiotics approximately 3 days ago.  However she continues to have urgency and dysuria.  She denies any fever or chills or nausea or vomiting.  She did a at home Azo test strips.  She took a photograph of this and sent the picture to me.  The leukocyte Estrace tested positive turning completely purple where its baseline is yellow.  The nitrite however remained yellow and is apparently negative.  Patient also had questions about what she could take for constipation as she is getting cramps from taking Dulcolax. Past Medical History:  Diagnosis Date  . 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 Allergic rhinitis   . 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 Anxiety disorder   . Aortic aneurysm ([place])   . Brain metastasis ([place])    lung cancer s/p resection 7/20  . Complication of anesthesia 2011   bloodpressure dropped during colonoscopy, not problems since  . Compression fracture   . COPD (chronic obstructive pulmonary disease) ([place])   . Depression   . Diastolic dysfunction   . Diverticulosis   . Dysphagia   . Emphysema lung ([place]) 11/08/2014  . Headache   . Hiatal hernia   . History of kidney stones   . Hypothyroidism   . IBS (irritable bowel syndrome)   . Iron deficiency anemia due to chronic blood loss 12/05/2016  . On home O2 12/15/2013   chronic hypoxia  . Pneumonia   . PUD (peptic ulcer disease)    80yrs  . Shortness of breath    with exertion  . SIADH (syndrome of inappropriate ADH production) ([place])   . Trigeminal neuralgia    Past Surgical History:  Procedure Laterality Date  . ABDOMINAL HYSTERECTOMY    . APPLICATION OF  CRANIAL NAVIGATION N/A 03/18/2019   Procedure: APPLICATION OF CRANIAL NAVIGATION;  Surgeon: Consuella Lose, MD;  Location: [facility];  Service: Neurosurgery;  Laterality: N/A;  . BACK SURGERY  January 13, 2014  . CATARACT EXTRACTION    . CATARACT EXTRACTION W/PHACO  09/02/2012   Procedure: CATARACT EXTRACTION PHACO AND INTRAOCULAR LENS PLACEMENT (IOC);  Surgeon: Elta Guadeloupe T. Gershon Crane, MD;  [facility];  Service: Ophthalmology;  Laterality: Left;  CDE:10.35  . CHOLECYSTECTOMY    . 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 COLONOSCOPY N/A 11/23/2014   Dr. Oneida Alar: moderate diverticulosis, hemorrhoids, redundant colon. next TCS in 10-15 years.   . CRANIOTOMY Left 03/18/2019   Procedure: Left stereotactic craniotomy for tumor resection;  Surgeon: Consuella Lose, MD;  Location: [facility];  Service: Neurosurgery;  Laterality: Left;  Left stereotactic craniotomy for tumor resection  . ENDARTERECTOMY Right 08/21/2013   Procedure: RIGHT CAROTID ANEURYSM RESECTION;  Surgeon: Rosetta Posner, MD;  Location: [facility];  Service: Vascular;  Laterality: Right;  . ESOPHAGEAL DILATION  02/24/2018   Procedure: ESOPHAGEAL DILATION;  Surgeon: Danie Binder, MD;  [facility];  Service: Endoscopy;;  . ESOPHAGOGASTRODUODENOSCOPY  11/13/2009   w/dilation to 58mm, gastric ulceration (H.Pylori) s/p treatment  . ESOPHAGOGASTRODUODENOSCOPY  11/21/2009  distal esophageal web, gastritis  . ESOPHAGOGASTRODUODENOSCOPY  03/14/12   [id number] in the distal esophagus/Mild gastritis/small HH. + H.pylori, prescribed Pylera. Finished treatment.   . ESOPHAGOGASTRODUODENOSCOPY N/A 12/13/2017   Procedure: ESOPHAGOGASTRODUODENOSCOPY (EGD);  Surgeon: Danie Binder, MD;  [facility];  Service: Endoscopy;  Laterality: N/A;  8:30am  . ESOPHAGOGASTRODUODENOSCOPY N/A 02/24/2018   Procedure: ESOPHAGOGASTRODUODENOSCOPY (EGD);  Surgeon: Danie Binder, MD;  [facility];  Service:  Endoscopy;  Laterality: N/A;  11:00am  . fatty tumor removal from lt groin    . FRACTURE SURGERY Left    hand and left ring finger  . GIVENS CAPSULE STUDY N/A 12/26/2017   Procedure: GIVENS CAPSULE STUDY;  Surgeon: Danie Binder, MD;  [facility];  Service: Endoscopy;  Laterality: N/A;  7:30am  . GIVENS CAPSULE STUDY N/A 02/24/2018   Procedure: GIVENS CAPSULE STUDY;  Surgeon: Danie Binder, MD;  [facility];  Service: Endoscopy;  Laterality: N/A;  . KNEE SURGERY     patella tendon repair june 2019 [facility]  . LUNG CANCER SURGERY  12/2010   Left VATS, minithoracotomy, LLL superior segmentectomy  . ORIF PATELLA Right 03/18/2018   Procedure: OPEN REDUCTION INTERNAL (ORIF) FIXATION RIGHT PATELLA;  Surgeon: Carole Civil, MD;  [facility];  Service: Orthopedics;  Laterality: Right;  . ORIF WRIST FRACTURE Left 04/09/2014   Procedure: OPEN REDUCTION INTERNAL FIXATION (ORIF) WRIST FRACTURE;  Surgeon: Renette Butters, MD;  Location: [facility];  Service: Orthopedics;  Laterality: Left;  . PERCUTANEOUS PINNING Left 04/09/2014   Procedure: PERCUTANEOUS PINNING EXTREMITY;  Surgeon: Renette Butters, MD;  Location: [place];  Service: Orthopedics;  Laterality: Left;  . SAVORY DILATION N/A 12/13/2017   Procedure: SAVORY DILATION;  Surgeon: Danie Binder, MD;  [facility];  Service: Endoscopy;  Laterality: N/A;  . TUBAL LIGATION    . vocal cord surgery  02/06/2011   laryngoscopy with bilateral vocal cord Radiesse injection for vocal cord paralysis  . YAG LASER APPLICATION Left 71/24/5809   Procedure: YAG LASER APPLICATION;  Surgeon: Rutherford Guys, MD;  [facility];  Service: Ophthalmology;  Laterality: Left;   Current Outpatient Medications on File Prior to Visit  Medication Sig Dispense Refill  . albuterol (VENTOLIN HFA) 108 (90 Base) MCG/ACT inhaler Inhale 2 puffs into the lungs every 6 (six) hours as needed for wheezing or shortness of breath. 18 g 2  .  ALPRAZolam (XANAX) 0.5 MG tablet TAKE (1) TABLET BY MOUTH TWICE A DAY AS NEEDED. (Patient taking differently: Take 0.5 mg by mouth at bedtime. ) 60 tablet 0  . ANORO ELLIPTA 62.5-25 MCG/INH AEPB INHALE 1 PUFF INTO THE LUNGS DAILY. 60 each 5  . bisacodyl (BISACODYL) 5 MG EC tablet Take 1 tablet (5 mg total) by mouth daily as needed for moderate constipation. 30 tablet 0  . citalopram (CELEXA) 40 MG tablet Take 0.5 tablets (20 mg total) by mouth at bedtime.    Marland Kitchen denosumab (PROLIA) 60 MG/ML SOSY injection Inject 60 mg into the skin every 6 (six) months. 1 mL 0  . dicyclomine (BENTYL) 20 MG tablet TAKE (1) TABLET BY MOUTH EVERY (6) HOURS. 90 tablet 0  . ferrous sulfate 325 (65 FE) MG tablet Take 325 mg by mouth daily with breakfast.    . fluticasone (FLONASE) 50 MCG/ACT nasal spray Place 2 sprays into both nostrils daily. 16 g 6  . Fluticasone-Umeclidin-Vilant (TRELEGY ELLIPTA) 100-62.5-25 MCG/INH AEPB Inhale 1 Inhaler into  the lungs daily. 1 each 5  . levocetirizine (XYZAL) 5 MG tablet TAKE ONE TABLET BY MOUTH ONCE DAILY. 30 tablet 5  . levothyroxine (SYNTHROID) 50 MCG tablet Take 1 tablet (50 mcg total) by mouth daily before breakfast.    . MELATONIN PO Take 1 capsule by mouth daily as needed (sleep).    . Multiple Vitamin (MULTIVITAMIN WITH MINERALS) TABS tablet Take 1 tablet by mouth daily.    . ondansetron (ZOFRAN ODT) 4 MG disintegrating tablet Take 1 tablet (4 mg total) by mouth every 4 (four) hours as needed for nausea or vomiting.    . pantoprazole (PROTONIX) 40 MG tablet Take 1 tablet (40 mg total) by mouth daily before breakfast. 90 tablet 3  . tiZANidine (ZANAFLEX) 4 MG tablet TAKE (1) TABLET BY MOUTH EVERY SIX HOURS AS NEEDED FOR MUSCLE SPASMS. (Patient not taking: Reported on 09/25/2019) 30 tablet 2   No current facility-administered medications on file prior to visit.   Allergies  Allergen Reactions  . Ciprofloxacin Hcl Hives  . Sulfonamide Derivatives Hives and Rash  . Iohexol  Other (See Comments)    UNSPECIFIED REACTION  Desc: Per [facility], pt is allergic to IV contrast, no type of reaction was available. Pt cannot remember but first reacted around 15 yrs ago from an IVP. Notes were date from 2009 at urology center., [place] Date: [id number]   . Prozac [Fluoxetine Hcl] Rash   Social History   Socioeconomic History  . Marital status: Divorced    Spouse name: Not on file  . Number of children: Not on file  . Years of education: Not on file  . Highest education level: Not on file  Occupational History  . Not on file  Tobacco Use  . Smoking status: Former Smoker    Packs/day: 0.50    Years: 20.00    Pack years: 10.00    Types: Cigarettes    Quit date: 12/21/2010    Years since quitting: 8.8  . Smokeless tobacco: Never Used  . Tobacco comment: smoking cessation info given and reviewed   Substance and Sexual Activity  . Alcohol use: Yes    Alcohol/week: 0.0 standard drinks    Comment: seldom  . Drug use: No  . Sexual activity: Yes    Birth control/protection: Surgical  Other Topics Concern  . Not on file  Social History Narrative  . Not on file   Social Determinants of Health   Financial Resource Strain:   . Difficulty of Paying Living Expenses: Not on file  Food Insecurity:   . Worried About Charity fundraiser in the Last Year: Not on file  . Ran Out of Food in the Last Year: Not on file  Transportation Needs:   . Lack of Transportation (Medical): Not on file  . Lack of Transportation (Non-Medical): Not on file  Physical Activity:   . Days of Exercise per Week: Not on file  . Minutes of Exercise per Session: Not on file  Stress:   . Feeling of Stress : Not on file  Social Connections:   . Frequency of Communication with Friends and Family: Not on file  . Frequency of Social Gatherings with Friends and Family: Not on file  . Attends Religious Services: Not on file  . Active Member of Clubs or Organizations: Not on file  . Attends  Archivist Meetings: Not on file  . Marital Status: Not on file  Intimate Partner Violence:   . Fear of Current  or Ex-Partner: Not on file  . Emotionally Abused: Not on file  . Physically Abused: Not on file  . Sexually Abused: Not on file      Review of Systems  All other systems reviewed and are negative.      Objective:   Physical Exam        Assessment & Plan:  Urgency of urination  Dysuria  Patient appears to still have a urinary tract infection.  We will switch to Keflex 500 mg p.o. 3 times daily for 7 days.  Patient will call me back in 1 week with a update.  If symptoms are persistent at that point I would recommend checking a urine culture as she does have a history of interstitial cystitis that may complicate matters.  I recommended that she try senna or MiraLAX and discontinue Dulcolax if she is having increased intestinal cramps on Dulcolax.

## 2019-11-10 NOTE — Telephone Encounter (Signed)
Ok to refill??  Last office visit 11/09/2019.  Last refill 09/10/2019.  Ok to add refills to prescription?

## 2019-11-12 ENCOUNTER — Ambulatory Visit (HOSPITAL_COMMUNITY): Payer: PPO

## 2019-11-12 ENCOUNTER — Other Ambulatory Visit (HOSPITAL_COMMUNITY): Payer: PPO

## 2019-11-16 ENCOUNTER — Encounter: Payer: Self-pay | Admitting: Gastroenterology

## 2019-11-16 ENCOUNTER — Ambulatory Visit (HOSPITAL_COMMUNITY): Payer: PPO | Admitting: Hematology

## 2019-11-16 NOTE — Progress Notes (Addendum)
REVIEWED-NO ADDITIONAL RECOMMENDATIONS.       Primary Care Physician:  Ruth Frizzle, MD Primary GI:  Ruth Drain, MD   Patient Location: Home  Provider Location: [facility] office  Reason for Visit:  Chief Complaint  Patient presents with  . Abdominal Pain    gurggling in stomach,alot of gas    Persons present on the virtual encounter, with roles: Patient, myself (provider),Angela Stallings CMA (updated meds and allergies)  Total time (minutes) spent on medical discussion: 23 minutes  Due to COVID-19, visit was conducted using [email or web address] method.  Visit was requested by patient.  Virtual Visit via [email or web address]  I connected with [name] on 11/17/19 at  8:00 AM EST by [email or web address] and verified that I am speaking with the correct person using two identifiers.   I discussed the limitations, risks, security and privacy concerns of performing an evaluation and management service by telephone/video and the availability of in person appointments. I also discussed with the patient that there may be a patient responsible charge related to this service. The patient expressed understanding and agreed to proceed.   HPI:   [name] is a 79 y.o. female who presents for virtual visit regarding stomach gurgling and a lot of gas. 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  Hospitalized in August with aspiration pneumonia.  H/O IDA with prior need for iron infusions, last time in 2019. EGD/TCS/[place] as outlined under [place]. 03/2019: left parietal adenocarcinoma.  Patient last seen in February 2019.  Patient states she was doing well from a GI standpoint until after she developed pneumonia and was hospitalized in August.  During that hospitalization she was having issues with poor appetite, could not eat.  Noted change in GI symptoms.  Started having a lot of gurgling noises in stomach, a lot of  gas. Dr. Dennard Schaumann started on bentyl TID which doesn't always work.  She has also tried Phazyme when she has a lot of gas, helps some.  She takes 1 Dulcolax every night at bedtime, has a bowel movement the following morning.  Generally midday she starts having abdominal cramping, gurgling, gas.  She drinks protein drink every day but has been on this for years.  She consumes almond milk.  Avoids dairy.  Appetite is good.  Weight is around 106 pounds and has been stable for several months.  Prior to her hospitalizations last year she was around 120 to 125 pounds.  Her typical baseline weight is around 115.  She states her appetite is good.  No heartburn or dysphagia.  No blood in the stools or melena.    Current Outpatient Medications  Medication Sig Dispense Refill  . albuterol (VENTOLIN HFA) 108 (90 Base) MCG/ACT inhaler Inhale 2 puffs into the lungs every 6 (six) hours as needed for wheezing or shortness of breath. 18 g 2  . ALPRAZolam (XANAX) 0.5 MG tablet TAKE (1) TABLET BY MOUTH TWICE A DAY AS NEEDED. (Patient taking differently: at bedtime. ) 60 tablet 3  . bisacodyl (BISACODYL) 5 MG EC tablet Take 1 tablet (5 mg total) by mouth daily as needed for moderate constipation. (Patient taking differently: Take 5 mg by mouth at bedtime. ) 30 tablet 0  . cephALEXin (KEFLEX) 500 MG capsule Take 1 capsule (500 mg total) by mouth 3 (three) times daily. Please deliver to patient 21 capsule 0  . citalopram (CELEXA) 40 MG  tablet Take 0.5 tablets (20 mg total) by mouth at bedtime.    Marland Kitchen denosumab (PROLIA) 60 MG/ML SOSY injection Inject 60 mg into the skin every 6 (six) months. 1 mL 0  . dicyclomine (BENTYL) 20 MG tablet TAKE (1) TABLET BY MOUTH EVERY (6) HOURS. (Patient taking differently: daily. ) 90 tablet 0  . ferrous sulfate 325 (65 FE) MG tablet Take 325 mg by mouth daily with breakfast.    . Fluticasone-Umeclidin-Vilant (TRELEGY ELLIPTA) 100-62.5-25 MCG/INH AEPB Inhale 1 Inhaler into the lungs daily. 1  each 5  . levocetirizine (XYZAL) 5 MG tablet TAKE ONE TABLET BY MOUTH ONCE DAILY. 30 tablet 5  . levothyroxine (SYNTHROID) 50 MCG tablet Take 1 tablet (50 mcg total) by mouth daily before breakfast.    . Multiple Vitamin (MULTIVITAMIN WITH MINERALS) TABS tablet Take 1 tablet by mouth daily.    . pantoprazole (PROTONIX) 40 MG tablet Take 1 tablet (40 mg total) by mouth daily before breakfast. 90 tablet 3  . tiZANidine (ZANAFLEX) 4 MG tablet TAKE (1) TABLET BY MOUTH EVERY SIX HOURS AS NEEDED FOR MUSCLE SPASMS. (Patient taking differently: as needed. ) 30 tablet 2   No current facility-administered medications for this visit.    Past Medical History:  Diagnosis Date  . Adenocarcinoma of lung ([place]) 12/2010   left lung/surg only  . Allergic rhinitis   . 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 Anxiety disorder   . Aortic aneurysm ([place])   . Brain metastasis ([place])    lung cancer s/p resection 7/20  . Complication of anesthesia 2011   bloodpressure dropped during colonoscopy, not problems since  . Compression fracture   . COPD (chronic obstructive pulmonary disease) ([place])   . Depression   . Diastolic dysfunction   . Diverticulosis   . Dysphagia   . Emphysema lung ([place]) 11/08/2014  . Headache   . Hiatal hernia   . History of kidney stones   . Hypothyroidism   . IBS (irritable bowel syndrome)   . Iron deficiency anemia due to chronic blood loss 12/05/2016  . On home O2 12/15/2013   chronic hypoxia  . Pneumonia   . PUD (peptic ulcer disease)    62yrs  . Shortness of breath    with exertion  . SIADH (syndrome of inappropriate ADH production) ([place])   . Trigeminal neuralgia     Past Surgical History:  Procedure Laterality Date  . ABDOMINAL HYSTERECTOMY    . APPLICATION OF CRANIAL NAVIGATION N/A 03/18/2019   Procedure: APPLICATION OF CRANIAL NAVIGATION;  Surgeon: Consuella Lose, MD;  Location: [place];  Service: Neurosurgery;  Laterality: N/A;  . BACK  SURGERY  January 13, 2014  . CATARACT EXTRACTION    . CATARACT EXTRACTION W/PHACO  09/02/2012   Procedure: CATARACT EXTRACTION PHACO AND INTRAOCULAR LENS PLACEMENT (IOC);  Surgeon: Elta Guadeloupe T. Gershon Crane, MD;  Location: [facility];  Service: Ophthalmology;  Laterality: Left;  CDE:10.35  . CHOLECYSTECTOMY    . 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 COLONOSCOPY N/A 11/23/2014   Dr. Oneida Alar: moderate diverticulosis, hemorrhoids, redundant colon. next TCS in 10-15 years.   . CRANIOTOMY Left 03/18/2019   Procedure: Left stereotactic craniotomy for tumor resection;  Surgeon: Consuella Lose, MD;  Location: [place];  Service: Neurosurgery;  Laterality: Left;  Left stereotactic craniotomy for tumor resection  . ENDARTERECTOMY Right 08/21/2013   Procedure: RIGHT CAROTID ANEURYSM RESECTION;  Surgeon: Rosetta Posner,  MD;  Location: [employer];  Service: Vascular;  Laterality: Right;  . ESOPHAGEAL DILATION  02/24/2018   Procedure: ESOPHAGEAL DILATION;  Surgeon: Danie Binder, MD;  [facility];  Service: Endoscopy;;  . ESOPHAGOGASTRODUODENOSCOPY  11/13/2009   w/dilation to 53mm, gastric ulceration (H.Pylori) s/p treatment  . ESOPHAGOGASTRODUODENOSCOPY  11/21/2009   distal esophageal web, gastritis  . ESOPHAGOGASTRODUODENOSCOPY  03/14/12   QIH:[id number] in the distal esophagus/Mild gastritis/small HH. + H.pylori, prescribed Pylera. Finished treatment.   . ESOPHAGOGASTRODUODENOSCOPY N/A 12/13/2017   Dr. Oneida Alar: esophageal web s/p dilation, gastritis, no h.pylori  . ESOPHAGOGASTRODUODENOSCOPY N/A 02/24/2018   Dr. Oneida Alar: web in prox esophagus and in distal esophagus s/p dilation, mild gastritis, mild pyloric stenosis, mild post ulcer duodenal deformity at D1/D2  . fatty tumor removal from lt groin    . FRACTURE SURGERY Left    hand and left ring finger  . GIVENS CAPSULE STUDY N/A 12/26/2017   incomplete  . GIVENS CAPSULE STUDY N/A 02/24/2018   normal small bowel  . KNEE SURGERY       patella tendon repair june 2019 [place]  . LUNG CANCER SURGERY  12/2010   Left VATS, minithoracotomy, LLL superior segmentectomy  . ORIF PATELLA Right 03/18/2018   Procedure: OPEN REDUCTION INTERNAL (ORIF) FIXATION RIGHT PATELLA;  Surgeon: Carole Civil, MD;  Location: [facility];  Service: Orthopedics;  Laterality: Right;  . ORIF WRIST FRACTURE Left 04/09/2014   Procedure: OPEN REDUCTION INTERNAL FIXATION (ORIF) WRIST FRACTURE;  Surgeon: Renette Butters, MD;  Location: [place];  Service: Orthopedics;  Laterality: Left;  . PERCUTANEOUS PINNING Left 04/09/2014   Procedure: PERCUTANEOUS PINNING EXTREMITY;  Surgeon: Renette Butters, MD;  Location: [place];  Service: Orthopedics;  Laterality: Left;  . SAVORY DILATION N/A 12/13/2017   Procedure: SAVORY DILATION;  Surgeon: Danie Binder, MD;  [facility];  Service: Endoscopy;  Laterality: N/A;  . TUBAL LIGATION    . vocal cord surgery  02/06/2011   laryngoscopy with bilateral vocal cord Radiesse injection for vocal cord paralysis  . YAG LASER APPLICATION Left 87/56/4332   Procedure: YAG LASER APPLICATION;  Surgeon: Rutherford Guys, MD;  Location[facility];  Service: Ophthalmology;  Laterality: Left;    Family History  Problem Relation Age of Onset  . Pulmonary embolism Mother   . Heart attack Father   . Hypertension Father   . Liver cancer Sister   . Cancer Sister   . Cancer Brother   . Cancer Daughter   . Colon cancer Neg Hx     Social History   Socioeconomic History  . Marital status: Divorced    Spouse name: Not on file  . Number of children: Not on file  . Years of education: Not on file  . Highest education level: Not on file  Occupational History  . Not on file  Tobacco Use  . Smoking status: Former Smoker    Packs/day: 0.50    Years: 20.00    Pack years: 10.00    Types: Cigarettes    Quit date: 12/21/2010    Years since quitting: 8.9  . Smokeless tobacco: Never Used  . Tobacco comment: smoking cessation info given  and reviewed   Substance and Sexual Activity  . Alcohol use: Not Currently    Alcohol/week: 0.0 standard drinks    Comment: seldom  . Drug use: No  . Sexual activity: Yes    Birth control/protection: Surgical  Other Topics Concern  . Not on file  Social History Narrative  . Not on file   Social Determinants of Health   Financial Resource Strain:   . Difficulty of Paying Living Expenses: Not on file  Food Insecurity:   . Worried About Charity fundraiser in the Last Year: Not on file  . Ran Out of Food in the Last Year: Not on file  Transportation Needs:   . Lack of Transportation (Medical): Not on file  . Lack of Transportation (Non-Medical): Not on file  Physical Activity:   . Days of Exercise per Week: Not on file  . Minutes of Exercise per Session: Not on file  Stress:   . Feeling of Stress : Not on file  Social Connections:   . Frequency of Communication with Friends and Family: Not on file  . Frequency of Social Gatherings with Friends and Family: Not on file  . Attends Religious Services: Not on file  . Active Member of Clubs or Organizations: Not on file  . Attends Archivist Meetings: Not on file  . Marital Status: Not on file  Intimate Partner Violence:   . Fear of Current or Ex-Partner: Not on file  . Emotionally Abused: Not on file  . Physically Abused: Not on file  . Sexually Abused: Not on file      ROS:  General: Negative for anorexia,  fever, chills, fatigue, weakness.  See HPI Eyes: Negative for vision changes.  ENT: Negative for hoarseness, difficulty swallowing , nasal congestion. CV: Negative for chest pain, angina, palpitations, dyspnea on exertion, peripheral edema.  Respiratory: Negative for dyspnea at rest,  cough, sputum, wheezing.  On home oxygen GI: See history of present illness. GU:  Negative for dysuria, hematuria, urinary incontinence, urinary frequency, nocturnal urination.  MS: Negative for joint pain, low back pain.    Derm: Negative for rash or itching.  Neuro: Negative for weakness, abnormal sensation, seizure, frequent headaches, memory loss, confusion.  Some word finding issues since stroke Psych: Negative for anxiety, depression, suicidal ideation, hallucinations.  Endo: Negative for unusual weight change.  Heme: Negative for bruising or bleeding. Allergy: Negative for rash or hives.   Observations/Objective: Pleasant elderly female, alert and oriented, no acute distress.  Nasal cannula oxygen in place.  Answers questions appropriately.  Good color.  Otherwise exam unavailable.  Lab Results  Component Value Date   CREATININE 0.46 06/04/2019   BUN 12 06/04/2019   NA 133 (L) 06/04/2019   K 3.9 06/04/2019   CL 98 06/04/2019   CO2 24 06/04/2019   Lab Results  Component Value Date   ALT 14 06/04/2019   AST 13 (L) 06/04/2019   ALKPHOS 127 (H) 06/04/2019   BILITOT 0.6 06/04/2019   Lab Results  Component Value Date   WBC 8.2 06/04/2019   HGB 13.0 06/04/2019   HCT 40.4 06/04/2019   MCV 91.0 06/04/2019   PLT 443 (H) 06/04/2019   Lab Results  Component Value Date   IRON 96 02/24/2018   TIBC 367 02/24/2018   FERRITIN 55 11/06/2018     Assessment and Plan: Pleasant 79 year old female with multiple comorbidities as outlined above, 2 significant hospitalizations last year, recent UTI completing antibiotic therapy, presenting for further evaluation of abdominal gas/rumbling/cramping but no diarrhea.  Appetite is good.  Her weight stabilized over the last couple months.  She did lose some significant weight around the time of her illness last year, surgery for golf ball size brain metastasis and hospitalization for pneumonia.  Discussed at length with  patient, abdominal gas/noise/crampiness may be due to side effect from Dulcolax versus need for probiotic, doubt infectious etiology.  Given no diarrhea, unlikely C. Difficile.  Historically with chronic constipation, previously failed Amitiza 24  mcg twice daily, MiraLAX, low-dose Linzess.  At this time we will add a good probiotic for the next several weeks.  If no significant improvement, would suggest switching Dulcolax to either Trulance or Linzess 145 mcg daily.  She may continue to use Phazyme as needed.  Avoid dairy.  Since she does not appreciate any benefit with dicyclomine, I have asked her to hold it.  Of course with holding it if her symptoms worsen she can go back on it.  Follow Up Instructions:    I discussed the assessment and treatment plan with the patient. The patient was provided an opportunity to ask questions and all were answered. The patient agreed with the plan and demonstrated an understanding of the instructions. AVS mailed to patient's home address.   The patient was advised to call back or seek an in-person evaluation if the symptoms worsen or if the condition fails to improve as anticipated.  I provided 23 minutes of virtual face-to-face time during this encounter.   Neil Crouch, PA-C

## 2019-11-17 ENCOUNTER — Other Ambulatory Visit: Payer: Self-pay

## 2019-11-17 ENCOUNTER — Encounter: Payer: Self-pay | Admitting: Gastroenterology

## 2019-11-17 ENCOUNTER — Ambulatory Visit (INDEPENDENT_AMBULATORY_CARE_PROVIDER_SITE_OTHER): Payer: PPO | Admitting: Gastroenterology

## 2019-11-17 ENCOUNTER — Encounter: Payer: Self-pay | Admitting: Family Medicine

## 2019-11-17 ENCOUNTER — Other Ambulatory Visit: Payer: PPO

## 2019-11-17 DIAGNOSIS — K581 Irritable bowel syndrome with constipation: Secondary | ICD-10-CM | POA: Diagnosis not present

## 2019-11-17 DIAGNOSIS — K589 Irritable bowel syndrome without diarrhea: Secondary | ICD-10-CM | POA: Insufficient documentation

## 2019-11-17 DIAGNOSIS — R143 Flatulence: Secondary | ICD-10-CM

## 2019-11-17 DIAGNOSIS — K5900 Constipation, unspecified: Secondary | ICD-10-CM

## 2019-11-17 DIAGNOSIS — R300 Dysuria: Secondary | ICD-10-CM | POA: Diagnosis not present

## 2019-11-17 LAB — URINALYSIS, ROUTINE W REFLEX MICROSCOPIC
Bilirubin Urine: NEGATIVE
Glucose, UA: NEGATIVE
Hgb urine dipstick: NEGATIVE
Ketones, ur: NEGATIVE
Leukocytes,Ua: NEGATIVE
Nitrite: NEGATIVE
Protein, ur: NEGATIVE
Specific Gravity, Urine: 1.027 (ref 1.001–1.03)
pH: 6 (ref 5.0–8.0)

## 2019-11-17 NOTE — Patient Instructions (Signed)
1. Stop Bentyl (dicyclomine) for now. If you feel like your stomach issues worsen when you stop the medication, you can resume as needed. 2. Add a good probiotic daily for next 4 weeks. We like Align, [employer]Span, or Restora. You can ask your local pharmacy for assistance if needed.  3. Call in 3-4 weeks and let me know how your symptoms are on the probiotic. If no significant change, then we will switch her Dulcolax to Linzess or Trulance.  4. You can continue Phazyme as needed if you find helpful.

## 2019-11-17 NOTE — Progress Notes (Signed)
Cc'ed to pcp °

## 2019-11-18 ENCOUNTER — Encounter: Payer: Self-pay | Admitting: Family Medicine

## 2019-11-18 ENCOUNTER — Other Ambulatory Visit: Payer: Self-pay | Admitting: Gastroenterology

## 2019-11-18 ENCOUNTER — Telehealth: Payer: Self-pay | Admitting: Gastroenterology

## 2019-11-18 ENCOUNTER — Other Ambulatory Visit: Payer: PPO

## 2019-11-18 LAB — URINE CULTURE
MICRO NUMBER:: 10133067
Result:: NO GROWTH
SPECIMEN QUALITY:: ADEQUATE

## 2019-11-18 NOTE — Telephone Encounter (Signed)
PT just needed a statement saying why the Align was recommended and she will present that and her receipt.  It is for expenses due to her housing for retirees.   I have completed the letter and putting in mail to her per her request.

## 2019-11-18 NOTE — Telephone Encounter (Signed)
Forwarding to Neil Crouch, PA to advise!

## 2019-11-18 NOTE — Telephone Encounter (Signed)
Pt has questions about getting a statement from [employer] stating why she needs Align (OTC) she could get it cheaper from her pharmacy. [phone]

## 2019-11-18 NOTE — Telephone Encounter (Signed)
Doris find out what she needs. I'm not sure that she will be able to get insurance to cover [place]. She can choose [place] or another affordable probiotic.   I'm ok with you providing a letter stating Align recommended due to bloating/gas/constipation.

## 2019-11-19 ENCOUNTER — Encounter: Payer: Self-pay | Admitting: Family Medicine

## 2019-11-23 ENCOUNTER — Telehealth: Payer: Self-pay | Admitting: *Deleted

## 2019-11-23 DIAGNOSIS — R109 Unspecified abdominal pain: Secondary | ICD-10-CM

## 2019-11-23 NOTE — Telephone Encounter (Signed)
Pt called in and said that pain in her belly is no better.  Hurts when she moves. Pt says that the pain is 3 inches from her belly button.   Pt said that she has a history or lung cancer and brain cancer.  [phone]

## 2019-11-23 NOTE — Telephone Encounter (Signed)
REVIEWED. PLEASE CALL PT. Consider CT scan.

## 2019-11-24 ENCOUNTER — Encounter: Payer: Self-pay | Admitting: Family Medicine

## 2019-11-24 ENCOUNTER — Telehealth: Payer: Self-pay | Admitting: Gastroenterology

## 2019-11-24 MED ORDER — DIPHENHYDRAMINE HCL 50 MG PO TABS: ORAL_TABLET | ORAL | 0 refills | Status: DC

## 2019-11-24 MED ORDER — PREDNISONE 50 MG PO TABS: ORAL_TABLET | ORAL | 0 refills | Status: DC

## 2019-11-24 NOTE — Telephone Encounter (Signed)
See previous note. Pt is scheduling the CT scan.

## 2019-11-24 NOTE — Telephone Encounter (Signed)
Patient has had CT contrast on two studies last year and prior allergy was documented in 2012.   Please find out if patient has been premedicated with benadryl and prednisone with her CTs last year.

## 2019-11-24 NOTE — Addendum Note (Signed)
Addended by: Mahala Menghini on: 11/24/2019 09:58 PM   Modules accepted: Orders

## 2019-11-24 NOTE — Telephone Encounter (Signed)
Prednisone 50mg  po 13, 7, 1 hour prior to CT Benadryl 50mg  1 hour prior to CT  I have sent RXs to pharmacy.

## 2019-11-24 NOTE — Telephone Encounter (Signed)
Called pt, she has to be pre-medicated if receiving contrast.

## 2019-11-24 NOTE — Telephone Encounter (Signed)
Pt said Ok to schedule the CT scan and would like a call from [place] first, since she will have to check on a ride.

## 2019-11-24 NOTE — Telephone Encounter (Signed)
Pt called to let us know her wi fi was out and she couldn't get to read any Mychart messages. She said her side was better and not any worse. [phone]

## 2019-11-24 NOTE — Telephone Encounter (Signed)
Offer patient CT A/P with contrast for mid-abdominal pain, h/o metastatic lung cancer.

## 2019-11-24 NOTE — Telephone Encounter (Signed)
[name], pt has a contrast allergy. Please advise.

## 2019-11-25 DIAGNOSIS — R109 Unspecified abdominal pain: Secondary | ICD-10-CM

## 2019-11-25 NOTE — Addendum Note (Signed)
Addended by: Hassan Rowan on: 11/25/2019 08:03 AM   Modules accepted: Orders

## 2019-11-25 NOTE — Telephone Encounter (Signed)
CT abd/pelvis scheduled for 12/07/19 at 8:00am, arrive at 7:45am. Pickup contrast before day of test. NPO 4 hours prior to test. Needs creatinine.   Tried to call pt, no answer, LMOVM to inform her CT has been scheduled. Letter mailed. She has MyChart.

## 2019-11-27 ENCOUNTER — Encounter: Payer: Self-pay | Admitting: Family Medicine

## 2019-11-27 ENCOUNTER — Ambulatory Visit (INDEPENDENT_AMBULATORY_CARE_PROVIDER_SITE_OTHER): Payer: PPO | Admitting: Family Medicine

## 2019-11-27 ENCOUNTER — Other Ambulatory Visit: Payer: Self-pay

## 2019-11-27 VITALS — BP 110/80 | HR 93 | Temp 97.3°F | Resp 16 | Ht 66.0 in | Wt 106.0 lb

## 2019-11-27 DIAGNOSIS — E871 Hypo-osmolality and hyponatremia: Secondary | ICD-10-CM

## 2019-11-27 DIAGNOSIS — J449 Chronic obstructive pulmonary disease, unspecified: Secondary | ICD-10-CM

## 2019-11-27 DIAGNOSIS — J9611 Chronic respiratory failure with hypoxia: Secondary | ICD-10-CM | POA: Diagnosis not present

## 2019-11-27 DIAGNOSIS — Z23 Encounter for immunization: Secondary | ICD-10-CM

## 2019-11-27 DIAGNOSIS — E038 Other specified hypothyroidism: Secondary | ICD-10-CM | POA: Diagnosis not present

## 2019-11-27 NOTE — Progress Notes (Addendum)
Subjective:    Patient ID: [name], female    DOB: [birth year], 79 y.o.   MRN: [id number]  HPI  Patient is here today to get recertified for oxygen.  The patient is currently on 2 L of oxygen via nasal cannula.  Past medical history is significant for COPD.  She also had a history of left lower lobe resection secondary to adenocarcinoma of the lung in 2012.  She has done remarkably well since but is required oxygen.  She is currently on Trelegy 1 inhalation daily.  She also still occasionally uses albuterol for wheezing and breakthrough bronchospasms.  Patient is 88% on room air at rest.  Today on 2 L with activity she is 93%.  Off of oxygen, the patient dropped to 87% after 100 feet.  Back on 2 L of oxygen via nasal cannula, her oxygen rebounds quickly to 93%.  She denies any chest pain shortness of breath or dyspnea on exertion beyond her baseline.  She does have fine right basilar crackles and rhonchorous breath sounds.  The patient had right lower lobe pneumonia in August 2020.  I question if this may be some residual scar tissue versus atelectasis as the patient is asymptomatic.  She is scheduled for imaging on March 1 at another 28 office I will await the results of this.  She denies any symptoms of pneumonia today.  She also has a history of hypothyroidism and is due to recheck her TSH she has not had lab work in quite some time.  She also has a history of hyponatremia with a baseline sodium between 132 and 133.  This is due as well.  She denies any dizziness.  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  The patient has right eye deviation as she tries to watch me in her right field of vision.  However she has neglect as she denies any diplopia.  This apparently has been a problem ever since her surgery.  The pain had brain metastasis surgically resected July 2020.  This is apparently a residual neurologic deficit after her brain surgery.  She is  using a walker to ambulate however she is doing quite well and is able to stand up from a seated position without any assistance.  Her legs are much stronger and she is not falling.  She denies any depression.  She denies any recent falls. Past Medical History:  Diagnosis Date  . Adenocarcinoma of lung ([place]) 12/2010   left lung/surg only  . Allergic rhinitis   . 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 Anxiety disorder   . Aortic aneurysm ([place])   . Brain metastasis ([place])    lung cancer s/p resection 7/20  . Complication of anesthesia 2011   bloodpressure dropped during colonoscopy, not problems since  . Compression fracture   . COPD (chronic obstructive pulmonary disease) ([place])   . Depression   . Diastolic dysfunction   . Diverticulosis   . Dysphagia   . Emphysema lung ([place]) 11/08/2014  . Headache   . Hiatal hernia   . History of kidney stones   . Hypothyroidism   . IBS (irritable bowel syndrome)   . Iron deficiency anemia due to chronic blood loss 12/05/2016  . On home O2 12/15/2013   chronic hypoxia  . Pneumonia   . PUD (peptic ulcer disease)    10yrs  . Shortness of breath    with  exertion  . SIADH (syndrome of inappropriate ADH production) ([place])   . Trigeminal neuralgia    Past Surgical History:  Procedure Laterality Date  . ABDOMINAL HYSTERECTOMY    . APPLICATION OF CRANIAL NAVIGATION N/A 03/18/2019   Procedure: APPLICATION OF CRANIAL NAVIGATION;  Surgeon: Consuella Lose, MD;  Location: [facility];  Service: Neurosurgery;  Laterality: N/A;  . BACK SURGERY  January 13, 2014  . CATARACT EXTRACTION    . CATARACT EXTRACTION W/PHACO  09/02/2012   Procedure: CATARACT EXTRACTION PHACO AND INTRAOCULAR LENS PLACEMENT (IOC);  Surgeon: Elta Guadeloupe T. Gershon Crane, MD;  [facility];  Service: Ophthalmology;  Laterality: Left;  CDE:10.35  . CHOLECYSTECTOMY    . 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 COLONOSCOPY N/A 11/23/2014    Dr. Oneida Alar: moderate diverticulosis, hemorrhoids, redundant colon. next TCS in 10-15 years.   . CRANIOTOMY Left 03/18/2019   Procedure: Left stereotactic craniotomy for tumor resection;  Surgeon: Consuella Lose, MD;  Location: [facility];  Service: Neurosurgery;  Laterality: Left;  Left stereotactic craniotomy for tumor resection  . ENDARTERECTOMY Right 08/21/2013   Procedure: RIGHT CAROTID ANEURYSM RESECTION;  Surgeon: Rosetta Posner, MD;  Location: [facility];  Service: Vascular;  Laterality: Right;  . ESOPHAGEAL DILATION  02/24/2018   Procedure: ESOPHAGEAL DILATION;  Surgeon: Danie Binder, MD;  [facility];  Service: Endoscopy;;  . ESOPHAGOGASTRODUODENOSCOPY  11/13/2009   w/dilation to 48mm, gastric ulceration (H.Pylori) s/p treatment  . ESOPHAGOGASTRODUODENOSCOPY  11/21/2009   distal esophageal web, gastritis  . ESOPHAGOGASTRODUODENOSCOPY  03/14/12   [id number] in the distal esophagus/Mild gastritis/small HH. + H.pylori, prescribed Pylera. Finished treatment.   . ESOPHAGOGASTRODUODENOSCOPY N/A 12/13/2017   Dr. Oneida Alar: esophageal web s/p dilation, gastritis, no h.pylori  . ESOPHAGOGASTRODUODENOSCOPY N/A 02/24/2018   Dr. Oneida Alar: web in prox esophagus and in distal esophagus s/p dilation, mild gastritis, mild pyloric stenosis, mild post ulcer duodenal deformity at D1/D2  . fatty tumor removal from lt groin    . FRACTURE SURGERY Left    hand and left ring finger  . GIVENS CAPSULE STUDY N/A 12/26/2017   incomplete  . GIVENS CAPSULE STUDY N/A 02/24/2018   normal small bowel  . KNEE SURGERY     patella tendon repair june 2019 [facility]  . LUNG CANCER SURGERY  12/2010   Left VATS, minithoracotomy, LLL superior segmentectomy  . ORIF PATELLA Right 03/18/2018   Procedure: OPEN REDUCTION INTERNAL (ORIF) FIXATION RIGHT PATELLA;  Surgeon: Carole Civil, MD;  [facility];  Service: Orthopedics;  Laterality: Right;  . ORIF WRIST FRACTURE Left 04/09/2014   Procedure: OPEN REDUCTION INTERNAL  FIXATION (ORIF) WRIST FRACTURE;  Surgeon: Renette Butters, MD;  Location: [facility];  Service: Orthopedics;  Laterality: Left;  . PERCUTANEOUS PINNING Left 04/09/2014   Procedure: PERCUTANEOUS PINNING EXTREMITY;  Surgeon: Renette Butters, MD;  Location: [facility];  Service: Orthopedics;  Laterality: Left;  . SAVORY DILATION N/A 12/13/2017   Procedure: SAVORY DILATION;  Surgeon: Danie Binder, MD;  [facility];  Service: Endoscopy;  Laterality: N/A;  . TUBAL LIGATION    . vocal cord surgery  02/06/2011   laryngoscopy with bilateral vocal cord Radiesse injection for vocal cord paralysis  . YAG LASER APPLICATION Left 31/51/7616   Procedure: YAG LASER APPLICATION;  Surgeon: Rutherford Guys, MD;  [facility];  Service: Ophthalmology;  Laterality: Left;   Current Outpatient Medications on File Prior to Visit  Medication Sig Dispense Refill  .  albuterol (VENTOLIN HFA) 108 (90 Base) MCG/ACT inhaler Inhale 2 puffs into the lungs every 6 (six) hours as needed for wheezing or shortness of breath. 18 g 2  . ALPRAZolam (XANAX) 0.5 MG tablet TAKE (1) TABLET BY MOUTH TWICE A DAY AS NEEDED. (Patient taking differently: at bedtime. ) 60 tablet 3  . bisacodyl (BISACODYL) 5 MG EC tablet Take 1 tablet (5 mg total) by mouth daily as needed for moderate constipation. (Patient taking differently: Take 5 mg by mouth at bedtime. ) 30 tablet 0  . cephALEXin (KEFLEX) 500 MG capsule Take 1 capsule (500 mg total) by mouth 3 (three) times daily. Please deliver to patient 21 capsule 0  . denosumab (PROLIA) 60 MG/ML SOSY injection Inject 60 mg into the skin every 6 (six) months. 1 mL 0  . dicyclomine (BENTYL) 20 MG tablet TAKE (1) TABLET BY MOUTH EVERY (6) HOURS. (Patient taking differently: daily. ) 90 tablet 0  . diphenhydrAMINE (BENADRYL) 50 MG tablet Take 50mg  po 1 hour before CT 1 tablet 0  . ferrous sulfate 325 (65 FE) MG tablet Take 325 mg by mouth daily with breakfast.    . Fluticasone-Umeclidin-Vilant (TRELEGY  ELLIPTA) 100-62.5-25 MCG/INH AEPB Inhale 1 Inhaler into the lungs daily. 1 each 5  . levocetirizine (XYZAL) 5 MG tablet TAKE ONE TABLET BY MOUTH ONCE DAILY. 30 tablet 5  . levothyroxine (SYNTHROID) 50 MCG tablet Take 1 tablet (50 mcg total) by mouth daily before breakfast.    . Multiple Vitamin (MULTIVITAMIN WITH MINERALS) TABS tablet Take 1 tablet by mouth daily.    . pantoprazole (PROTONIX) 40 MG tablet TAKE (1) TABLET BY MOUTH ONCE DAILY BEFORE BREAKFAST. 90 tablet 3  . predniSONE (DELTASONE) 50 MG tablet Take 50mg  po 13, 7, 1 hour before CT 3 tablet 0  . tiZANidine (ZANAFLEX) 4 MG tablet TAKE (1) TABLET BY MOUTH EVERY SIX HOURS AS NEEDED FOR MUSCLE SPASMS. (Patient taking differently: as needed. ) 30 tablet 2   No current facility-administered medications on file prior to visit.   Allergies  Allergen Reactions  . Ciprofloxacin Hcl Hives  . Sulfonamide Derivatives Hives and Rash  . Iohexol Other (See Comments)    UNSPECIFIED REACTION  Desc: Per [facility], pt is allergic to IV contrast, no type of reaction was available. Pt cannot remember but first reacted around 15 yrs ago from an IVP. Notes were date from 2009 at urology center., Onset Date: [id number]   . Prozac [Fluoxetine Hcl] Rash   Social History   Socioeconomic History  . Marital status: Divorced    Spouse name: Not on file  . Number of children: Not on file  . Years of education: Not on file  . Highest education level: Not on file  Occupational History  . Not on file  Tobacco Use  . Smoking status: Former Smoker    Packs/day: 0.50    Years: 20.00    Pack years: 10.00    Types: Cigarettes    Quit date: 12/21/2010    Years since quitting: 8.9  . Smokeless tobacco: Never Used  . Tobacco comment: smoking cessation info given and reviewed   Substance and Sexual Activity  . Alcohol use: Not Currently    Alcohol/week: 0.0 standard drinks    Comment: seldom  . Drug use: No  . Sexual activity: Yes    Birth  control/protection: Surgical  Other Topics Concern  . Not on file  Social History Narrative  . Not on file   Social Determinants  of Health   Financial Resource Strain:   . Difficulty of Paying Living Expenses: Not on file  Food Insecurity:   . Worried About Charity fundraiser in the Last Year: Not on file  . Ran Out of Food in the Last Year: Not on file  Transportation Needs:   . Lack of Transportation (Medical): Not on file  . Lack of Transportation (Non-Medical): Not on file  Physical Activity:   . Days of Exercise per Week: Not on file  . Minutes of Exercise per Session: Not on file  Stress:   . Feeling of Stress : Not on file  Social Connections:   . Frequency of Communication with Friends and Family: Not on file  . Frequency of Social Gatherings with Friends and Family: Not on file  . Attends Religious Services: Not on file  . Active Member of Clubs or Organizations: Not on file  . Attends Archivist Meetings: Not on file  . Marital Status: Not on file  Intimate Partner Violence:   . Fear of Current or Ex-Partner: Not on file  . Emotionally Abused: Not on file  . Physically Abused: Not on file  . Sexually Abused: Not on file     Past Medical History:  Diagnosis Date  . Adenocarcinoma of lung ([place]) 12/2010   left lung/surg only  . Allergic rhinitis   . 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 Anxiety disorder   . Aortic aneurysm ([place])   . Brain metastasis ([place])    lung cancer s/p resection 7/20  . Complication of anesthesia 2011   bloodpressure dropped during colonoscopy, not problems since  . Compression fracture   . COPD (chronic obstructive pulmonary disease) ([place])   . Depression   . Diastolic dysfunction   . Diverticulosis   . Dysphagia   . Emphysema lung ([place]) 11/08/2014  . Headache   . Hiatal hernia   . History of kidney stones   . Hypothyroidism   . IBS (irritable bowel syndrome)   . Iron deficiency anemia due  to chronic blood loss 12/05/2016  . On home O2 12/15/2013   chronic hypoxia  . Pneumonia   . PUD (peptic ulcer disease)    8yrs  . Shortness of breath    with exertion  . SIADH (syndrome of inappropriate ADH production) ([place])   . Trigeminal neuralgia    Past Surgical History:  Procedure Laterality Date  . ABDOMINAL HYSTERECTOMY    . APPLICATION OF CRANIAL NAVIGATION N/A 03/18/2019   Procedure: APPLICATION OF CRANIAL NAVIGATION;  Surgeon: Consuella Lose, MD;  Location: [facility];  Service: Neurosurgery;  Laterality: N/A;  . BACK SURGERY  January 13, 2014  . CATARACT EXTRACTION    . CATARACT EXTRACTION W/PHACO  09/02/2012   Procedure: CATARACT EXTRACTION PHACO AND INTRAOCULAR LENS PLACEMENT (IOC);  Surgeon: Elta Guadeloupe T. Gershon Crane, MD;  [facility];  Service: Ophthalmology;  Laterality: Left;  CDE:10.35  . CHOLECYSTECTOMY    . 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 COLONOSCOPY N/A 11/23/2014   Dr. Oneida Alar: moderate diverticulosis, hemorrhoids, redundant colon. next TCS in 10-15 years.   . CRANIOTOMY Left 03/18/2019   Procedure: Left stereotactic craniotomy for tumor resection;  Surgeon: Consuella Lose, MD;  Location: [facility];  Service: Neurosurgery;  Laterality: Left;  Left stereotactic craniotomy for tumor resection  . ENDARTERECTOMY Right 08/21/2013   Procedure: RIGHT CAROTID ANEURYSM RESECTION;  Surgeon: Arvilla Meres  Early, MD;  [facility];  Service: Vascular;  Laterality: Right;  . ESOPHAGEAL DILATION  02/24/2018   Procedure: ESOPHAGEAL DILATION;  Surgeon: Danie Binder, MD;  [facility];  Service: Endoscopy;;  . ESOPHAGOGASTRODUODENOSCOPY  11/13/2009   w/dilation to 21mm, gastric ulceration (H.Pylori) s/p treatment  . ESOPHAGOGASTRODUODENOSCOPY  11/21/2009   distal esophageal web, gastritis  . ESOPHAGOGASTRODUODENOSCOPY  03/14/12   [id number] in the distal esophagus/Mild gastritis/small HH. + H.pylori, prescribed Pylera. Finished  treatment.   . ESOPHAGOGASTRODUODENOSCOPY N/A 12/13/2017   Dr. Oneida Alar: esophageal web s/p dilation, gastritis, no h.pylori  . ESOPHAGOGASTRODUODENOSCOPY N/A 02/24/2018   Dr. Oneida Alar: web in prox esophagus and in distal esophagus s/p dilation, mild gastritis, mild pyloric stenosis, mild post ulcer duodenal deformity at D1/D2  . fatty tumor removal from lt groin    . FRACTURE SURGERY Left    hand and left ring finger  . GIVENS CAPSULE STUDY N/A 12/26/2017   incomplete  . GIVENS CAPSULE STUDY N/A 02/24/2018   normal small bowel  . KNEE SURGERY     patella tendon repair june 2019 [facility]  . LUNG CANCER SURGERY  12/2010   Left VATS, minithoracotomy, LLL superior segmentectomy  . ORIF PATELLA Right 03/18/2018   Procedure: OPEN REDUCTION INTERNAL (ORIF) FIXATION RIGHT PATELLA;  Surgeon: Carole Civil, MD;  [facility];  Service: Orthopedics;  Laterality: Right;  . ORIF WRIST FRACTURE Left 04/09/2014   Procedure: OPEN REDUCTION INTERNAL FIXATION (ORIF) WRIST FRACTURE;  Surgeon: Renette Butters, MD;  Location: [facility];  Service: Orthopedics;  Laterality: Left;  . PERCUTANEOUS PINNING Left 04/09/2014   Procedure: PERCUTANEOUS PINNING EXTREMITY;  Surgeon: Renette Butters, MD;  Location: [facility];  Service: Orthopedics;  Laterality: Left;  . SAVORY DILATION N/A 12/13/2017   Procedure: SAVORY DILATION;  Surgeon: Danie Binder, MD;  [facility];  Service: Endoscopy;  Laterality: N/A;  . TUBAL LIGATION    . vocal cord surgery  02/06/2011   laryngoscopy with bilateral vocal cord Radiesse injection for vocal cord paralysis  . YAG LASER APPLICATION Left 64/40/3474   Procedure: YAG LASER APPLICATION;  Surgeon: Rutherford Guys, MD;  [facility];  Service: Ophthalmology;  Laterality: Left;   Current Outpatient Medications on File Prior to Visit  Medication Sig Dispense Refill  . albuterol (VENTOLIN HFA) 108 (90 Base) MCG/ACT inhaler Inhale 2 puffs into the lungs every 6 (six) hours as needed for  wheezing or shortness of breath. 18 g 2  . ALPRAZolam (XANAX) 0.5 MG tablet TAKE (1) TABLET BY MOUTH TWICE A DAY AS NEEDED. (Patient taking differently: at bedtime. ) 60 tablet 3  . bisacodyl (BISACODYL) 5 MG EC tablet Take 1 tablet (5 mg total) by mouth daily as needed for moderate constipation. (Patient taking differently: Take 5 mg by mouth at bedtime. ) 30 tablet 0  . cephALEXin (KEFLEX) 500 MG capsule Take 1 capsule (500 mg total) by mouth 3 (three) times daily. Please deliver to patient 21 capsule 0  . denosumab (PROLIA) 60 MG/ML SOSY injection Inject 60 mg into the skin every 6 (six) months. 1 mL 0  . dicyclomine (BENTYL) 20 MG tablet TAKE (1) TABLET BY MOUTH EVERY (6) HOURS. (Patient taking differently: daily. ) 90 tablet 0  . diphenhydrAMINE (BENADRYL) 50 MG tablet Take 50mg  po 1 hour before CT 1 tablet 0  . ferrous sulfate 325 (65 FE) MG tablet Take 325 mg by mouth daily with breakfast.    . Fluticasone-Umeclidin-Vilant (TRELEGY  ELLIPTA) 100-62.5-25 MCG/INH AEPB Inhale 1 Inhaler into the lungs daily. 1 each 5  . levocetirizine (XYZAL) 5 MG tablet TAKE ONE TABLET BY MOUTH ONCE DAILY. 30 tablet 5  . levothyroxine (SYNTHROID) 50 MCG tablet Take 1 tablet (50 mcg total) by mouth daily before breakfast.    . Multiple Vitamin (MULTIVITAMIN WITH MINERALS) TABS tablet Take 1 tablet by mouth daily.    . pantoprazole (PROTONIX) 40 MG tablet TAKE (1) TABLET BY MOUTH ONCE DAILY BEFORE BREAKFAST. 90 tablet 3  . predniSONE (DELTASONE) 50 MG tablet Take 50mg  po 13, 7, 1 hour before CT 3 tablet 0  . tiZANidine (ZANAFLEX) 4 MG tablet TAKE (1) TABLET BY MOUTH EVERY SIX HOURS AS NEEDED FOR MUSCLE SPASMS. (Patient taking differently: as needed. ) 30 tablet 2   No current facility-administered medications on file prior to visit.   Allergies  Allergen Reactions  . Ciprofloxacin Hcl Hives  . Sulfonamide Derivatives Hives and Rash  . Iohexol Other (See Comments)    UNSPECIFIED REACTION  Desc: [facility], pt is allergic to IV contrast, no type of reaction was available. Pt cannot remember but first reacted around 15 yrs ago from an IVP. Notes were date from 2009 at urology center., [place]set Date: [id number]   . Prozac [Fluoxetine Hcl] Rash   Social History   Socioeconomic History  . Marital status: Divorced    Spouse name: Not on file  . Number of children: Not on file  . Years of education: Not on file  . Highest education level: Not on file  Occupational History  . Not on file  Tobacco Use  . Smoking status: Former Smoker    Packs/day: 0.50    Years: 20.00    Pack years: 10.00    Types: Cigarettes    Quit date: 12/21/2010    Years since quitting: 8.9  . Smokeless tobacco: Never Used  . Tobacco comment: smoking cessation info given and reviewed   Substance and Sexual Activity  . Alcohol use: Not Currently    Alcohol/week: 0.0 standard drinks    Comment: seldom  . Drug use: No  . Sexual activity: Yes    Birth control/protection: Surgical  Other Topics Concern  . Not on file  Social History Narrative  . Not on file   Social Determinants of Health   Financial Resource Strain:   . Difficulty of Paying Living Expenses: Not on file  Food Insecurity:   . Worried About Charity fundraiser in the Last Year: Not on file  . Ran Out of Food in the Last Year: Not on file  Transportation Needs:   . Lack of Transportation (Medical): Not on file  . Lack of Transportation (Non-Medical): Not on file  Physical Activity:   . Days of Exercise per Week: Not on file  . Minutes of Exercise per Session: Not on file  Stress:   . Feeling of Stress : Not on file  Social Connections:   . Frequency of Communication with Friends and Family: Not on file  . Frequency of Social Gatherings with Friends and Family: Not on file  . Attends Religious Services: Not on file  . Active Member of Clubs or Organizations: Not on file  . Attends Archivist Meetings: Not on file  . Marital  Status: Not on file  Intimate Partner Violence:   . Fear of Current or Ex-Partner: Not on file  . Emotionally Abused: Not on file  . Physically Abused: Not on  file  . Sexually Abused: Not on file    Review of Systems  All other systems reviewed and are negative.      Objective:   Physical Exam  Constitutional: She appears well-developed and well-nourished. No distress.  Neck: No JVD present. No thyromegaly present.  Cardiovascular: Normal rate, regular rhythm and normal heart sounds.  No murmur heard. Pulmonary/Chest: Effort normal. She has wheezes. She has rhonchi in the right lower field.  Abdominal: Soft. Bowel sounds are normal. She exhibits no distension. There is no abdominal tenderness. There is no rebound and no guarding.  Musculoskeletal:        General: No edema.  Lymphadenopathy:    She has no cervical adenopathy.  Skin: She is not diaphoretic.          Assessment & Plan:  COPD (chronic obstructive pulmonary disease) with chronic bronchitis (HCC)  Chronic respiratory failure with hypoxia (HCC)  Hyponatremia - Plan: CBC with Differential/Platelet, COMPLETE METABOLIC PANEL WITH GFR  Other specified hypothyroidism - Plan: TSH  Patient requires oxygen she drops to 87% with activity on room air but quickly rebounded to 93% on 2 L via nasal cannula.  Patient benefits from oxygen use for her COPD and should continue use at 2 lpm for continuous use.  We will try to have the patient approved for chronic oxygen use.  Continue Trelegy 1 inhalation a day as this seems to be well managing her COPD.  She does have some abnormal breath sounds in the right lower lobe but has a CT scan scheduled for March 1.  Therefore I will await the results of the CT scan.  She has a chronic history of hyponatremia and therefore I will repeat a CMP today to monitor this.  She also has a history of hypothyroidism and I will check a TSH to ensure that the 50 mcg a day levothyroxine that she is  taking is an adequate dose.  She is also due for Prevnar 13 which she received a booster on today.

## 2019-11-27 NOTE — Addendum Note (Signed)
Addended by: Shary Decamp B on: 11/27/2019 04:49 PM   Modules accepted: Orders

## 2019-11-28 LAB — COMPLETE METABOLIC PANEL WITH GFR
AG Ratio: 2.1 (calc) (ref 1.0–2.5)
ALT: 34 U/L — ABNORMAL HIGH (ref 6–29)
AST: 24 U/L (ref 10–35)
Albumin: 4.2 g/dL (ref 3.6–5.1)
Alkaline phosphatase (APISO): 52 U/L (ref 37–153)
BUN: 24 mg/dL (ref 7–25)
CO2: 31 mmol/L (ref 20–32)
Calcium: 10.2 mg/dL (ref 8.6–10.4)
Chloride: 98 mmol/L (ref 98–110)
Creat: 0.92 mg/dL (ref 0.60–0.93)
GFR, Est African American: 69 mL/min/{1.73_m2} (ref >=60)
GFR, Est Non African American: 60 mL/min/{1.73_m2} (ref >=60)
Globulin: 2 g/dL (calc) (ref 1.9–3.7)
Glucose, Bld: 87 mg/dL (ref 65–99)
Potassium: 4.8 mmol/L (ref 3.5–5.3)
Sodium: 138 mmol/L (ref 135–146)
Total Bilirubin: 0.4 mg/dL (ref 0.2–1.2)
Total Protein: 6.2 g/dL (ref 6.1–8.1)

## 2019-11-28 LAB — TSH: TSH: 1.2 mIU/L (ref 0.40–4.50)

## 2019-11-28 LAB — CBC WITH DIFFERENTIAL/PLATELET
Absolute Monocytes: 516 cells/uL (ref 200–950)
Basophils Absolute: 47 cells/uL (ref 0–200)
Basophils Relative: 0.7 %
Eosinophils Absolute: 121 cells/uL (ref 15–500)
Eosinophils Relative: 1.8 %
HCT: 40.8 % (ref 35.0–45.0)
Hemoglobin: 13.6 g/dL (ref 11.7–15.5)
Lymphs Abs: 1146 cells/uL (ref 850–3900)
MCH: 30.5 pg (ref 27.0–33.0)
MCHC: 33.3 g/dL (ref 32.0–36.0)
MCV: 91.5 fL (ref 80.0–100.0)
MPV: 9.9 fL (ref 7.5–12.5)
Monocytes Relative: 7.7 %
Neutro Abs: 4871 cells/uL (ref 1500–7800)
Neutrophils Relative %: 72.7 %
Platelets: 269 10*3/uL (ref 140–400)
RBC: 4.46 10*6/uL (ref 3.80–5.10)
RDW: 11.8 % (ref 11.0–15.0)
Total Lymphocyte: 17.1 %
WBC: 6.7 10*3/uL (ref 3.8–10.8)

## 2019-11-30 ENCOUNTER — Telehealth: Payer: Self-pay | Admitting: Family Medicine

## 2019-11-30 NOTE — Telephone Encounter (Signed)
Last  Ov sent to [facility] via route through [facility].   Fax - [phone]

## 2019-12-01 ENCOUNTER — Encounter: Payer: Self-pay | Admitting: Family Medicine

## 2019-12-01 ENCOUNTER — Other Ambulatory Visit: Payer: Self-pay | Admitting: Family Medicine

## 2019-12-02 ENCOUNTER — Telehealth: Payer: Self-pay | Admitting: Gastroenterology

## 2019-12-02 ENCOUNTER — Encounter: Payer: Self-pay | Admitting: Family Medicine

## 2019-12-02 NOTE — Telephone Encounter (Signed)
noted 

## 2019-12-02 NOTE — Telephone Encounter (Signed)
Pt called to let us know she had labs done at PCP and wanted to know if that was sufficient.  She was supposed to have creatinine before CT on Monday 12/07/2019.  She is aware she will not have to have it drawn and I have faxed it to Radiology and Rosine Door is aware.

## 2019-12-02 NOTE — Telephone Encounter (Signed)
LMOM for a return call.  

## 2019-12-02 NOTE — Telephone Encounter (Signed)
Pt has questions about her labs. [phone]

## 2019-12-03 ENCOUNTER — Encounter: Payer: Self-pay | Admitting: *Deleted

## 2019-12-03 ENCOUNTER — Other Ambulatory Visit: Payer: Self-pay | Admitting: *Deleted

## 2019-12-03 MED ORDER — PROLIA 60 MG/ML ~~LOC~~ SOSY: 60.0000 mg | PREFILLED_SYRINGE | SUBCUTANEOUS | 0 refills | Status: DC

## 2019-12-07 ENCOUNTER — Encounter: Payer: Self-pay | Admitting: Family Medicine

## 2019-12-07 ENCOUNTER — Other Ambulatory Visit: Payer: Self-pay

## 2019-12-07 ENCOUNTER — Ambulatory Visit (HOSPITAL_COMMUNITY)
Admission: RE | Admit: 2019-12-07 | Discharge: 2019-12-07 | Disposition: A | Discharge status: Home / Self Care | Payer: PPO | Source: Ambulatory Visit | Attending: Gastroenterology | Admitting: Gastroenterology

## 2019-12-07 DIAGNOSIS — R109 Unspecified abdominal pain: Secondary | ICD-10-CM | POA: Insufficient documentation

## 2019-12-07 MED ORDER — IOHEXOL 300 MG/ML  SOLN
75.0000 mL | Freq: Once | INTRAMUSCULAR | Status: AC | PRN
  Administered 2019-12-07: 09:00:00 75 mL via INTRAVENOUS

## 2019-12-08 ENCOUNTER — Telehealth: Payer: Self-pay | Admitting: *Deleted

## 2019-12-08 ENCOUNTER — Encounter: Payer: Self-pay | Admitting: Gastroenterology

## 2019-12-08 NOTE — Telephone Encounter (Signed)
Called pt and made her aware of results and recommendations.  Pt said that she had already viewed them on my chart.  Pt said that she ran out of Dulcolax so she took some Amitiza but doesn't feel like it worked too well.  She said she hasn't had any relief using Linzess, Miralax, Dulcolax, or Amitiza.  She wants to know if we can prescribe something different.  She uses [facility].

## 2019-12-09 ENCOUNTER — Telehealth: Payer: Self-pay | Admitting: Gastroenterology

## 2019-12-09 MED ORDER — LINACLOTIDE 145 MCG PO CAPS: 145.0000 ug | ORAL_CAPSULE | Freq: Every day | ORAL | 3 refills | Status: DC

## 2019-12-09 MED ORDER — TRULANCE 3 MG PO TABS: 3.0000 mg | ORAL_TABLET | Freq: Every day | ORAL | 3 refills | Status: DC

## 2019-12-09 NOTE — Telephone Encounter (Signed)
Called pt and informed her to stop bentyl for now so it doesn't add to her constipation.  Pt made aware that rx for trulance 3 mg was sent to her pharmacy.  Pt informed to call me if it isn't covered.  She was told if not covered, we may consider going back to Linzess higher dose.  Pt voiced understanding.

## 2019-12-09 NOTE — Telephone Encounter (Signed)
Pt called to let us know that her insurance doesn't cover trulance.

## 2019-12-09 NOTE — Addendum Note (Signed)
Addended by: Mahala Menghini on: 12/09/2019 07:44 AM   Modules accepted: Orders

## 2019-12-09 NOTE — Telephone Encounter (Signed)
Ok. I have sent in rx for linzess, higher dose then what she took before.

## 2019-12-09 NOTE — Telephone Encounter (Signed)
Called pt and she was made aware that [facility] sent in a RX for Linzess, higher dose then what she took before.  Pt voiced understanding.

## 2019-12-09 NOTE — Telephone Encounter (Signed)
Please make sure she stops bentyl for now as this will add to constipation.  rx sent for trulance 3mg  daily. If not covered, we may consider going back to Linzess higher dose (she tried 41mcg before).

## 2019-12-11 ENCOUNTER — Encounter: Payer: Self-pay | Admitting: Family Medicine

## 2019-12-11 ENCOUNTER — Other Ambulatory Visit: Payer: Self-pay | Admitting: *Deleted

## 2019-12-11 DIAGNOSIS — C7931 Secondary malignant neoplasm of brain: Secondary | ICD-10-CM

## 2019-12-13 ENCOUNTER — Encounter: Payer: Self-pay | Admitting: Family Medicine

## 2019-12-14 ENCOUNTER — Other Ambulatory Visit: Payer: Self-pay | Admitting: *Deleted

## 2019-12-14 ENCOUNTER — Telehealth: Payer: Self-pay

## 2019-12-14 ENCOUNTER — Other Ambulatory Visit: Payer: Self-pay | Admitting: Radiation Therapy

## 2019-12-14 ENCOUNTER — Other Ambulatory Visit: Payer: Self-pay | Admitting: Family Medicine

## 2019-12-14 ENCOUNTER — Encounter: Payer: Self-pay | Admitting: Family Medicine

## 2019-12-14 MED ORDER — NITROFURANTOIN MONOHYD MACRO 100 MG PO CAPS: 100.0000 mg | ORAL_CAPSULE | Freq: Two times a day (BID) | ORAL | 0 refills | Status: DC

## 2019-12-14 NOTE — Telephone Encounter (Signed)
Please try the antibiotic.

## 2019-12-14 NOTE — Telephone Encounter (Signed)
Pt called and stated that she did not drop off a urine specimen due to being immobile. Pt took test at home clean catch. Pt states that Dr. Dennard Schaumann has sent in antibiotics to pharmacy in the past.

## 2019-12-14 NOTE — Telephone Encounter (Signed)
MD please advise.   Patient sent in image of at home test.

## 2019-12-14 NOTE — Telephone Encounter (Signed)
Image just shows a chicken, don't see a urine test. I am fine calling out something for UTI but I am confused.

## 2019-12-14 NOTE — Telephone Encounter (Signed)
Correct image reviewed with PCP.   ABTx sent to pharmacy.

## 2019-12-14 NOTE — Telephone Encounter (Signed)
Patient aware per MyChart.   

## 2019-12-14 NOTE — Telephone Encounter (Signed)
Call placed to patient and patient made aware.   States that she had this type of infection around 11/27/2019. Reports that Macrobid was given, but it did not clear her infection. Reports that a stronger ABTx had to be prescribed to clear her infection.   Advised that if this broad spectrum ABTx does not treat her Sx, then we will require a urine sample for UAC&S.  Requested MD to review and provide recommendations.

## 2019-12-18 ENCOUNTER — Other Ambulatory Visit: Payer: Self-pay

## 2019-12-18 ENCOUNTER — Ambulatory Visit (INDEPENDENT_AMBULATORY_CARE_PROVIDER_SITE_OTHER): Payer: PPO | Admitting: *Deleted

## 2019-12-18 DIAGNOSIS — M810 Age-related osteoporosis without current pathological fracture: Secondary | ICD-10-CM | POA: Diagnosis not present

## 2019-12-18 MED ORDER — DENOSUMAB 60 MG/ML ~~LOC~~ SOSY
60.0000 mg | PREFILLED_SYRINGE | Freq: Once | SUBCUTANEOUS | Status: AC
  Administered 2019-12-18: 60 mg via SUBCUTANEOUS

## 2019-12-24 ENCOUNTER — Ambulatory Visit
Admission: RE | Admit: 2019-12-24 | Discharge: 2019-12-24 | Disposition: A | Discharge status: Home / Self Care | Payer: PPO | Source: Ambulatory Visit | Attending: Radiation Oncology | Admitting: Radiation Oncology

## 2019-12-24 DIAGNOSIS — C7931 Secondary malignant neoplasm of brain: Secondary | ICD-10-CM

## 2019-12-24 DIAGNOSIS — C3490 Malignant neoplasm of unspecified part of unspecified bronchus or lung: Secondary | ICD-10-CM | POA: Diagnosis not present

## 2019-12-24 MED ORDER — GADOBENATE DIMEGLUMINE 529 MG/ML IV SOLN
10.0000 mL | Freq: Once | INTRAVENOUS | Status: AC | PRN
  Administered 2019-12-24: 12:00:00 10 mL via INTRAVENOUS

## 2019-12-24 NOTE — Progress Notes (Signed)
[name], please set-up [facility]

## 2019-12-25 ENCOUNTER — Encounter: Payer: Self-pay | Admitting: Family Medicine

## 2019-12-25 ENCOUNTER — Encounter: Payer: Self-pay | Admitting: Urology

## 2019-12-25 ENCOUNTER — Inpatient Hospital Stay
Admission: RE | Admit: 2019-12-25 | Discharge: 2019-12-25 | Disposition: A | Discharge status: Home / Self Care | Payer: PPO | Source: Ambulatory Visit | Attending: Urology | Admitting: Urology

## 2019-12-25 ENCOUNTER — Telehealth: Payer: Self-pay | Admitting: Radiation Oncology

## 2019-12-25 ENCOUNTER — Encounter: Payer: Self-pay | Admitting: Radiation Oncology

## 2019-12-25 ENCOUNTER — Other Ambulatory Visit: Payer: Self-pay | Admitting: Family Medicine

## 2019-12-25 ENCOUNTER — Other Ambulatory Visit: Payer: Self-pay

## 2019-12-25 DIAGNOSIS — Z85841 Personal history of malignant neoplasm of brain: Secondary | ICD-10-CM | POA: Diagnosis not present

## 2019-12-25 DIAGNOSIS — C7931 Secondary malignant neoplasm of brain: Secondary | ICD-10-CM

## 2019-12-25 DIAGNOSIS — Z923 Personal history of irradiation: Secondary | ICD-10-CM | POA: Diagnosis not present

## 2019-12-25 DIAGNOSIS — Z85118 Personal history of other malignant neoplasm of bronchus and lung: Secondary | ICD-10-CM | POA: Diagnosis not present

## 2019-12-25 IMAGING — NM NM MYOCAR MULTI W/SPECT W/WALL MOTION & EF
2 series · 12 of 12 positions shown · non-contrast
Comparison: none

[Series 1: rest · 6.51mm/px · 6 of 64 frames shown]
[frame 6/64]
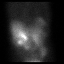
[frame 16/64]
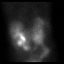
[frame 27/64]
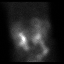
[frame 38/64]
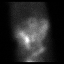
[frame 48/64]
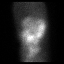
[frame 59/64]
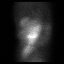

[Series 2: stress gated - gated · 6.51mm/px · 6 of 512 frames shown]
[frame 43/512]
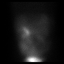
[frame 128/512]
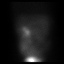
[frame 214/512]
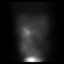
[frame 299/512]
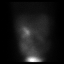
[frame 384/512]
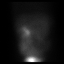
[frame 470/512]
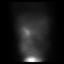

[12 of 12 positions shown; findings below may reference images not displayed]

Canned report from images found in remote index.

Refer to host system for actual result text.

## 2019-12-25 NOTE — Telephone Encounter (Signed)
Received voicemail message from patient questioning if she can get the COVID 19 vaccination. Phoned patient back at my first break. No answer. Left detailed message explaining that from a radiation standpoint she is free to get her vaccination without restriction. Also, explained if she is actively receiving chemotherapy she should check with Dr. Jamey Reas before getting vaccinated. Provided my directed number for future questions or needs.

## 2019-12-25 NOTE — Progress Notes (Signed)
Radiation Oncology         [phone] ________________________________  Name: [name] MRN: [id number]  Date: 12/25/2019  DOB: [birth year]  Post Treatment Note: Conducted via telephone due to current COVID-19 concerns for limiting patient exposure  CC: Ruth Frizzle, MD  Ruth Frizzle, MD   Diagnosis:   79 y.o. 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  Interval Since Last Radiation: 9 months 03/17/19:  Pre-Op SRS brain in a single fraction to the following targets: PTV1 Post Lt Parietal 74mm 18Gy PTV2 Lt Cerebellar 43mm  20Gy PTV3 Rt Temp 72mm 20Gy PTV4 Post Lt Insular 76mm  20 Gy PTV5 Lt Frontal Gyrus 48mm  20Gy  Followed by stereotactic left parietal craniotomy for resection of tumor with Dr. Kathyrn Sheriff on 03/18/19.  Narrative:  I spoke with the patientl to conduct her routine scheduled 3 month follow up visit via telephone to spare the patient unnecessary potential exposure in the healthcare setting during the current COVID-19 pandemic.  The patient was notified in advance and gave permission to proceed with this visit format.  Her visit today is to review her follow up MRI which was performed on 12/24/19.  This study shows a 3 mm enhancing lesion along the inferior right internal capsule. This was punctate on the prior study.  Additionally, there is a 5 mm enhancing left frontal lesion along the falx that was also previously punctate dating back to 06/26/2019.  She has also had a recent CT A/P on 12/07/19 for further assessment of abdominal pain and this did not show any pathologically enlarged lymph nodes or other findings to suggest metastatic disease in the abdomen or pelvis. The abdominal pain was felt most likely secondary to constipation considering the volume of stool in the colon.  Her prior follow up MRI scans had been without evidence of recurrent tumor, new masses or abnormal enhancement. She had recent systemic imaging for disease  restaging with PET on 09/14/19 which appeared stable as well, with no evidence of recurrent or progressive disease.  Given her disease stability, she has remained on observation only and has a scheduled follow up with Dr. Delton Coombes in 03/2020 with plans to repeat systemic imaging with CT Chest prior to that visit.                     On review of systems, she states that she has continued getting along very well with improved balance and mobility since completing treatment. She specifically denies headaches, N/V, dizziness, recent changes in auditory or visual acuity, tremors, difficulty with speech or seizure activity. She has completed formal PT in her home but continues with a home exercise routine that they provided and is able to continue living at home independently which she is quite pleased with.  She had a bout with aspiration pneumonia in 05/2018 which has completely resolved after multiple courses of antibiotics and steroids.  She has not had recent productive cough, increased shortness of breath, hemoptysis, fever or chills.  She also reports that the constipation that she was struggling with previously has since resolved with use of Miralax prn.  ALLERGIES:  is allergic to ciprofloxacin hcl; sulfonamide derivatives; iohexol; and prozac [fluoxetine hcl].  Meds: Current Outpatient Medications  Medication Sig Dispense Refill  . albuterol (VENTOLIN HFA) 108 (90 Base) MCG/ACT inhaler Inhale 2 puffs into the lungs every 6 (six) hours as needed for wheezing or shortness of breath. 18 g 2  . ALPRAZolam Duanne Moron)  0.5 MG tablet TAKE (1) TABLET BY MOUTH TWICE A DAY AS NEEDED. (Patient taking differently: at bedtime. ) 60 tablet 3  . citalopram (CELEXA) 40 MG tablet TAKE (1/2) TABLET BY MOUTH AT BEDTIME. 45 tablet 5  . denosumab (PROLIA) 60 MG/ML SOSY injection Inject 60 mg into the skin every 6 (six) months. 1 mL 0  . ferrous sulfate 325 (65 FE) MG tablet Take 325 mg by mouth daily with breakfast.    .  levocetirizine (XYZAL) 5 MG tablet TAKE ONE TABLET BY MOUTH ONCE DAILY. 30 tablet 5  . Multiple Vitamin (MULTIVITAMIN WITH MINERALS) TABS tablet Take 1 tablet by mouth daily.    . nitrofurantoin, macrocrystal-monohydrate, (MACROBID) 100 MG capsule Take 1 capsule (100 mg total) by mouth 2 (two) times daily. 14 capsule 0  . pantoprazole (PROTONIX) 40 MG tablet TAKE (1) TABLET BY MOUTH ONCE DAILY BEFORE BREAKFAST. 90 tablet 3  . tiZANidine (ZANAFLEX) 4 MG tablet TAKE (1) TABLET BY MOUTH EVERY SIX HOURS AS NEEDED FOR MUSCLE SPASMS. (Patient taking differently: as needed. ) 30 tablet 2  . TRELEGY ELLIPTA 100-62.5-25 MCG/INH AEPB INHALE 1 PUFF INTO LUNGS DAILY. 60 each 5  . levothyroxine (SYNTHROID) 50 MCG tablet TAKE 1 TABLET BEFORE BREAKFAST. 90 tablet 0  . linaclotide (LINZESS) 145 MCG CAPS capsule Take 1 capsule (145 mcg total) by mouth daily before breakfast. 30 capsule 3   No current facility-administered medications for this encounter.    Physical Findings:  vitals were not taken for this visit.   /Unable to assess due to telephone visit format.   Lab Findings: Lab Results  Component Value Date   WBC 6.7 11/27/2019   HGB 13.6 11/27/2019   HCT 40.8 11/27/2019   MCV 91.5 11/27/2019   PLT 269 11/27/2019     Radiographic Findings: MR Brain W Wo Contrast  Result Date: 12/24/2019 CLINICAL DATA:  Metastatic lung cancer post resection and SRS EXAM: MRI HEAD WITHOUT AND WITH CONTRAST TECHNIQUE: Multiplanar, multiecho pulse sequences of the brain and surrounding structures were obtained without and with intravenous contrast. CONTRAST:  24mL MULTIHANCE GADOBENATE DIMEGLUMINE 529 MG/ML IV SOLN COMPARISON:  09/24/2019 FINDINGS: Brain: Hemosiderin lined resection cavity underlying left parieto-occipital craniotomy again identified with further contraction. Stable thin extra-axial collection. Increased enhancement at the anterosuperior aspect of the cavity (series 12, image 11). The extent of  surrounding T2 FLAIR hyperintensity again remains similar. There is a 3 mm enhancing lesion along the internal capsule (series 11, image 80). This was punctate on the prior study. There is a 5 mm enhancing left frontal lesion along the falx (series 12, image 42). This was also previously punctate dating back to 06/26/2019. No hydrocephalus. Prominence of the ventricles and sulci reflects stable parenchymal volume loss. Additional patchy T2 hyperintensity in the supratentorial and pontine white matter is nonspecific but may reflect chronic microvascular ischemic changes. There is no acute infarction. Vascular: Major vessel flow voids at the skull base are preserved. Skull and upper cervical spine: Normal marrow signal is preserved. Sinuses/Orbits: Paranasal sinuses are aerated. Orbits are unremarkable. Other: Sella is unremarkable.  Mastoid air cells are clear. IMPRESSION: Increased enhancement at the anterosuperior aspect of the surgical cavity. This is probably treatment related but attention on follow-up is recommended. 3 mm suspected metastasis along the inferior right internal capsule and 5 mm suspected left frontal metastasis along the falx. Electronically Signed   By: Macy Mis M.D.   On: 12/24/2019 15:23   CT Abdomen Pelvis W Contrast  Result  Date: 12/07/2019 CLINICAL DATA:  Abdominal pain.  Lung cancer. EXAM: CT ABDOMEN AND PELVIS WITH CONTRAST TECHNIQUE: Multidetector CT imaging of the abdomen and pelvis was performed using the standard protocol following bolus administration of intravenous contrast. CONTRAST:  18mL OMNIPAQUE IOHEXOL 300 MG/ML  SOLN COMPARISON:  PET 09/14/2019 and CT abdomen pelvis 06/04/2019. FINDINGS: Lower chest: Centrilobular emphysema. Scattered pleuroparenchymal scarring. Atherosclerotic calcification of the aorta and aortic valve. Heart size normal. Small pericardial effusion, similar. No pleural fluid. Hepatobiliary: Liver is unremarkable. Cholecystectomy. No biliary ductal  dilatation. Pancreas: Negative. Spleen: Negative. Adrenals/Urinary Tract: Adrenal glands are unremarkable. Minimal scarring in the right kidney. Tiny stone in the right kidney. Kidneys are otherwise unremarkable. Ureters are decompressed. Bladder is grossly unremarkable. Stomach/Bowel: Stomach and small bowel are unremarkable. Appendix is not well-visualized. Fair amount of stool is seen in the colon. Vascular/Lymphatic: Atherosclerotic calcification of the aorta. Infrarenal aorta measures up to 3.2 cm as before. Ectatic proximal right external iliac artery, 1.7 cm. No pathologically enlarged lymph nodes. Reproductive: Hysterectomy.  No adnexal mass. Other: No free fluid. Mild omental haziness in the left lower quadrant (2/36), nonspecific. No discrete nodularity. Musculoskeletal: Degenerative changes in the spine and right hip. Postoperative changes in the spine. L3 compression fracture is unchanged. IMPRESSION: 1. No acute findings to explain the patient's abdominal pain. Fair amount of stool in the colon is indicative of constipation. 2. Small pericardial effusion, stable. 3. Tiny right renal stone. 4. 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 Recommend followup by ultrasound in 3 years. This recommendation follows ACR consensus guidelines: White Paper of the ACR Incidental Findings Committee II on Vascular Findings. J Am Coll Radiol 2013; 10:789-794. 5. Aortic atherosclerosis (ICD10-I70.0). 6.  Emphysema (ICD10-J43.9). Electronically Signed   By: Lorin Picket M.D.   On: 12/07/2019 09:17    Impression/Plan:  This visit was conducted via telephone to spare the patient unnecessary potential exposure in the healthcare setting during the current COVID-19 pandemic.  43. 79 y.o. 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 She continues to do well clinically and has recovered well from the effects of her previous pre-op SRS and remains without complaints.  Unfortunately, her most  recent follow up MRI brain from 12/24/19 shows 2 areas of concern for disease recurrence with a 3 mm enhancing lesion along the inferior right internal capsule that was punctate on the prior study and a 5 mm enhancing left frontal lesion along the falx that was also previously punctate dating back to 06/26/2019. We discussed that Dr. Tammi Klippel has personally reviewed the imaging and recommends treating these two lesions with a single fraction of SRS, similar to her prior treatment which she tolerated very well.  She appears to have a good understanding of her disease and our recommendations which are of curative intent and she is comfortable and in agreement with the stated plan.  I did advise that I will share this discussion with Dr. Delton Coombes as she may very well need a repeat chest CT sooner than 03/27/2020 given the new findings on her brain MRI.  However, I will leave that up to Dr. Delton Coombes and we will move forward with treatment planning accordingly.  At the conclusion of our conversation, the patient elects to proceed with a single fraction of SRS to the 2 new metastatic lesions noted on recent MRI.  She has provided verbal consent to proceed today over the phone and is comfortable signing the formal written consent when she comes in for her CT simulation/treatment planning appointment  on Monday, 12/28/2019.  Mont Dutton, RT RT was present and participated throughout our discussion today as well.  She will work to coordinate the [facility] treatment with the patient's neurosurgeon, Dr. Kathyrn Sheriff in the near future.  She knows to call with any questions or concerns in the interim.  Given current concerns for patient exposure during the COVID-19 pandemic, this encounter was conducted via telephone. The patient was notified in advance and was offered a WebEX or [place] video enabled meeting to allow for face to face communication but unfortunately reported that she did not have the appropriate resources/technology  to support such a visit and instead preferred to proceed with a telephone follow up visit. The patient has given verbal consent for this type of encounter. The time spent during this encounter was 35 minutes. The attendants for this meeting include Danniell Rotundo PA-C, and patient, Annalia Metzger. During the encounter, Shaylie Eklund PA-C, was located at [facility] Radiation Oncology Department.  Patient, Kaili Castille was located at home.    Nicholos Johns, PA-C

## 2019-12-28 ENCOUNTER — Ambulatory Visit
Admission: RE | Admit: 2019-12-28 | Discharge: 2019-12-28 | Disposition: A | Discharge status: Home / Self Care | Payer: PPO | Source: Ambulatory Visit | Attending: Radiation Oncology | Admitting: Radiation Oncology

## 2019-12-28 ENCOUNTER — Other Ambulatory Visit: Payer: Self-pay

## 2019-12-28 ENCOUNTER — Inpatient Hospital Stay: Payer: PPO | Attending: Radiation Oncology

## 2019-12-28 ENCOUNTER — Ambulatory Visit: Payer: PPO

## 2019-12-28 DIAGNOSIS — Z510 Encounter for antineoplastic radiation therapy: Secondary | ICD-10-CM | POA: Insufficient documentation

## 2019-12-28 DIAGNOSIS — C3432 Malignant neoplasm of lower lobe, left bronchus or lung: Secondary | ICD-10-CM | POA: Insufficient documentation

## 2019-12-28 DIAGNOSIS — C7931 Secondary malignant neoplasm of brain: Secondary | ICD-10-CM | POA: Diagnosis not present

## 2019-12-29 ENCOUNTER — Ambulatory Visit: Payer: PPO | Admitting: Radiation Oncology

## 2019-12-29 ENCOUNTER — Ambulatory Visit: Payer: PPO

## 2019-12-29 ENCOUNTER — Ambulatory Visit (INDEPENDENT_AMBULATORY_CARE_PROVIDER_SITE_OTHER): Payer: PPO | Admitting: Family Medicine

## 2019-12-29 DIAGNOSIS — C3432 Malignant neoplasm of lower lobe, left bronchus or lung: Secondary | ICD-10-CM | POA: Insufficient documentation

## 2019-12-29 DIAGNOSIS — R3915 Urgency of urination: Secondary | ICD-10-CM | POA: Diagnosis not present

## 2019-12-29 NOTE — Progress Notes (Signed)
Subjective:    Patient ID: [name], female    DOB: [birth year], 79 y.o.   MRN: [id number]  HPI  Patient is being seen today as a telephone visit.  She consents to be seen via telephone.  Phone call began at 957.  Phone call concluded at 1013.  I treated the patient with 7 days of Macrobid for urinary tract infection starting March 8.  This was based on home test strips that showed leukocyte esterase.  The patient contacted the office last week when I was out of the office and requested additional antibiotics.  She has been treated twice for a bladder infection the last 4 weeks.  Once in February however the culture at that time was negative on February the 9th.  The second time was between March 8 and March 15.  We have yet to have a urine sample to confirm an infection or positive urine culture.  However the patient has been on 2 or 3 separate rounds of antibiotics.  Is difficult to gather from her how many different antibiotic she has taken as the history is somewhat difficult to follow.  I tried to pinpoint the patient exactly what symptoms she is having.  She reports urgency.  She states that she feels like she has to urinate frequently.  She also reports occasional incontinence without any other symptoms.  She denies any dysuria.  She denies any fevers or chills.  She denies any hesitancy.  She denies any back pain or pelvic pain.  I explained to the patient that her symptoms could possibly be due to overactive bladder and not simply a urinary tract infection given the fact her culture originally was negative and she has been treated on separate occasions with antibiotics without any improvement.  Therefore I would request a urinalysis urine culture prior to starting additional antibiotics to avoid antibiotic resistance and also to appropriately treat the patient.  Patient unfortunately recently had an MRI which showed a 3 mm metastasis as well as a 5 mm metastasis from her recent brain cancer.   She is scheduled to undergo radiation treatment on Friday.  It is difficult for her to find transportation to the office.  Therefore we had a long discussion about whether we should treat this empirically again versus obtaining a urine culture. Past Medical History:  Diagnosis Date  . 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 Allergic rhinitis   . 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 Anxiety disorder   . Aortic aneurysm ([place])   . Brain metastasis ([place])    lung cancer s/p resection 7/20  . Complication of anesthesia 2011   bloodpressure dropped during colonoscopy, not problems since  . Compression fracture   . COPD (chronic obstructive pulmonary disease) ([place])   . Depression   . Diastolic dysfunction   . Diverticulosis   . Dysphagia   . Emphysema lung ([place]) 11/08/2014  . Headache   . Hiatal hernia   . History of kidney stones   . Hypothyroidism   . IBS (irritable bowel syndrome)   . Iron deficiency anemia due to chronic blood loss 12/05/2016  . On home O2 12/15/2013   chronic hypoxia  . Pneumonia   . PUD (peptic ulcer disease)    28yrs  . Shortness of breath    with exertion  . SIADH (syndrome of inappropriate ADH production) ([place])   . Trigeminal neuralgia  Past Surgical History:  Procedure Laterality Date  . ABDOMINAL HYSTERECTOMY    . APPLICATION OF CRANIAL NAVIGATION N/A 03/18/2019   Procedure: APPLICATION OF CRANIAL NAVIGATION;  Surgeon: Consuella Lose, MD;  Location: [facility];  Service: Neurosurgery;  Laterality: N/A;  . BACK SURGERY  January 13, 2014  . CATARACT EXTRACTION    . CATARACT EXTRACTION W/PHACO  09/02/2012   Procedure: CATARACT EXTRACTION PHACO AND INTRAOCULAR LENS PLACEMENT (IOC);  Surgeon: Elta Guadeloupe T. Gershon Crane, MD;  Location: [facility];  Service: Ophthalmology;  Laterality: Left;  CDE:10.35  . CHOLECYSTECTOMY    . 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 COLONOSCOPY N/A  11/23/2014   Dr. Oneida Alar: moderate diverticulosis, hemorrhoids, redundant colon. next TCS in 10-15 years.   . CRANIOTOMY Left 03/18/2019   Procedure: Left stereotactic craniotomy for tumor resection;  Surgeon: Consuella Lose, MD;  Location: [facility];  Service: Neurosurgery;  Laterality: Left;  Left stereotactic craniotomy for tumor resection  . ENDARTERECTOMY Right 08/21/2013   Procedure: RIGHT CAROTID ANEURYSM RESECTION;  Surgeon: Rosetta Posner, MD;  Location: [facility];  Service: Vascular;  Laterality: Right;  . ESOPHAGEAL DILATION  02/24/2018   Procedure: ESOPHAGEAL DILATION;  Surgeon: Danie Binder, MD;  [facility];  Service: Endoscopy;;  . ESOPHAGOGASTRODUODENOSCOPY  11/13/2009   w/dilation to 23mm, gastric ulceration (H.Pylori) s/p treatment  . ESOPHAGOGASTRODUODENOSCOPY  11/21/2009   distal esophageal web, gastritis  . ESOPHAGOGASTRODUODENOSCOPY  03/14/12   QIH:KVQQVZDGL in the distal esophagus/Mild gastritis/small HH. + H.pylori, prescribed Pylera. Finished treatment.   . ESOPHAGOGASTRODUODENOSCOPY N/A 12/13/2017   Dr. Oneida Alar: esophageal web s/p dilation, gastritis, no h.pylori  . ESOPHAGOGASTRODUODENOSCOPY N/A 02/24/2018   Dr. Oneida Alar: web in prox esophagus and in distal esophagus s/p dilation, mild gastritis, mild pyloric stenosis, mild post ulcer duodenal deformity at D1/D2  . fatty tumor removal from lt groin    . FRACTURE SURGERY Left    hand and left ring finger  . GIVENS CAPSULE STUDY N/A 12/26/2017   incomplete  . GIVENS CAPSULE STUDY N/A 02/24/2018   normal small bowel  . KNEE SURGERY     patella tendon repair june 2019 [facility]  . LUNG CANCER SURGERY  12/2010   Left VATS, minithoracotomy, LLL superior segmentectomy  . ORIF PATELLA Right 03/18/2018   Procedure: OPEN REDUCTION INTERNAL (ORIF) FIXATION RIGHT PATELLA;  Surgeon: Carole Civil, MD;  Location[facility];  Service: Orthopedics;  Laterality: Right;  . ORIF WRIST FRACTURE Left 04/09/2014   Procedure: OPEN  REDUCTION INTERNAL FIXATION (ORIF) WRIST FRACTURE;  Surgeon: Renette Butters, MD;  Location: [facility];  Service: Orthopedics;  Laterality: Left;  . PERCUTANEOUS PINNING Left 04/09/2014   Procedure: PERCUTANEOUS PINNING EXTREMITY;  Surgeon: Renette Butters, MD;  Location: [facility];  Service: Orthopedics;  Laterality: Left;  . SAVORY DILATION N/A 12/13/2017   Procedure: SAVORY DILATION;  Surgeon: Danie Binder, MD;  [facility];  Service: Endoscopy;  Laterality: N/A;  . TUBAL LIGATION    . vocal cord surgery  02/06/2011   laryngoscopy with bilateral vocal cord Radiesse injection for vocal cord paralysis  . YAG LASER APPLICATION Left 87/56/4332   Procedure: YAG LASER APPLICATION;  Surgeon: Rutherford Guys, MD;  [facility];  Service: Ophthalmology;  Laterality: Left;   Current Outpatient Medications on File Prior to Visit  Medication Sig Dispense Refill  . albuterol (VENTOLIN HFA) 108 (90 Base) MCG/ACT inhaler Inhale 2 puffs into the lungs every 6 (six)  hours as needed for wheezing or shortness of breath. 18 g 2  . ALPRAZolam (XANAX) 0.5 MG tablet TAKE (1) TABLET BY MOUTH TWICE A DAY AS NEEDED. (Patient taking differently: at bedtime. ) 60 tablet 3  . citalopram (CELEXA) 40 MG tablet TAKE (1/2) TABLET BY MOUTH AT BEDTIME. 45 tablet 5  . denosumab (PROLIA) 60 MG/ML SOSY injection Inject 60 mg into the skin every 6 (six) months. 1 mL 0  . ferrous sulfate 325 (65 FE) MG tablet Take 325 mg by mouth daily with breakfast.    . levocetirizine (XYZAL) 5 MG tablet TAKE ONE TABLET BY MOUTH ONCE DAILY. 30 tablet 5  . levothyroxine (SYNTHROID) 50 MCG tablet TAKE 1 TABLET BEFORE BREAKFAST. 90 tablet 0  . linaclotide (LINZESS) 145 MCG CAPS capsule Take 1 capsule (145 mcg total) by mouth daily before breakfast. 30 capsule 3  . Multiple Vitamin (MULTIVITAMIN WITH MINERALS) TABS tablet Take 1 tablet by mouth daily.    . nitrofurantoin, macrocrystal-monohydrate, (MACROBID) 100 MG capsule Take 1 capsule (100  mg total) by mouth 2 (two) times daily. 14 capsule 0  . pantoprazole (PROTONIX) 40 MG tablet TAKE (1) TABLET BY MOUTH ONCE DAILY BEFORE BREAKFAST. 90 tablet 3  . tiZANidine (ZANAFLEX) 4 MG tablet TAKE (1) TABLET BY MOUTH EVERY SIX HOURS AS NEEDED FOR MUSCLE SPASMS. (Patient taking differently: as needed. ) 30 tablet 2  . TRELEGY ELLIPTA 100-62.5-25 MCG/INH AEPB INHALE 1 PUFF INTO LUNGS DAILY. 60 each 5   No current facility-administered medications on file prior to visit.   Allergies  Allergen Reactions  . Ciprofloxacin Hcl Hives  . Sulfonamide Derivatives Hives and Rash  . Iohexol Other (See Comments)    UNSPECIFIED REACTION  Desc: Per [facility], pt is allergic to IV contrast, no type of reaction was available. Pt cannot remember but first reacted around 15 yrs ago from an IVP. Notes were date from 2009 at urology center., Onset Date: [id number]   . Prozac [Fluoxetine Hcl] Rash   Social History   Socioeconomic History  . Marital status: Divorced    Spouse name: Not on file  . Number of children: Not on file  . Years of education: Not on file  . Highest education level: Not on file  Occupational History  . Not on file  Tobacco Use  . Smoking status: Former Smoker    Packs/day: 0.50    Years: 20.00    Pack years: 10.00    Types: Cigarettes    Quit date: 12/21/2010    Years since quitting: 9.0  . Smokeless tobacco: Never Used  . Tobacco comment: smoking cessation info given and reviewed   Substance and Sexual Activity  . Alcohol use: Not Currently    Alcohol/week: 0.0 standard drinks    Comment: seldom  . Drug use: No  . Sexual activity: Yes    Birth control/protection: Surgical  Other Topics Concern  . Not on file  Social History Narrative  . Not on file   Social Determinants of Health   Financial Resource Strain:   . Difficulty of Paying Living Expenses:   Food Insecurity:   . Worried About Charity fundraiser in the Last Year:   . Arboriculturist in the  Last Year:   Transportation Needs:   . Film/video editor (Medical):   Marland Kitchen Lack of Transportation (Non-Medical):   Physical Activity:   . Days of Exercise per Week:   . Minutes of Exercise per Session:  Stress:   . Feeling of Stress :   Social Connections:   . Frequency of Communication with Friends and Family:   . Frequency of Social Gatherings with Friends and Family:   . Attends Religious Services:   . Active Member of Clubs or Organizations:   . Attends Archivist Meetings:   Marland Kitchen Marital Status:   Intimate Partner Violence:   . Fear of Current or Ex-Partner:   . Emotionally Abused:   Marland Kitchen Physically Abused:   . Sexually Abused:      Review of Systems  All other systems reviewed and are negative.      Objective:   Physical Exam        Assessment & Plan:  Urgency of urination - Plan: Urinalysis, Routine w reflex microscopic, Urine Culture  I am happy to call in an antibiotic for the patient however given the fact that she believes she has had 3 separate urinary tract infections the last 4 weeks I question whether this is a urinary tract infection versus urinary urgency from possibly overactive bladder.  Therefore I believe it would be better medicine to obtain a urinalysis and a urine culture to definitively diagnose whether this is truly a urinary tract infection plus determine what antibiotic it is sensitive to to avoid antibiotic resistance.  If the urine culture is negative, I will treat the patient for overactive bladder with Vesicare.  Patient would rather not take an antibiotic again she wants to get the urine culture and therefore she states that she will bring a urine sample by for urine culture at her earliest convenience.

## 2019-12-29 NOTE — Progress Notes (Signed)
  Radiation Oncology         [phone]) [phone] ________________________________  Name: [name] MRN: [id number]  Date: 12/28/2019  DOB: 1940-12-22  SIMULATION AND TREATMENT PLANNING NOTE    ICD-10-CM   1. Brain metastasis ([place])  C79.31   2. 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  Identity was confirmed.  All relevant records and images related to the planned course of therapy were reviewed.  The patient freely provided informed written consent to proceed with treatment after reviewing the details related to the planned course of therapy. The consent form was witnessed and verified by the simulation staff. Intravenous access was established for contrast administration. Then, the patient was set-up in a stable reproducible supine position for radiation therapy.  A relocatable thermoplastic stereotactic head frame was fabricated for precise immobilization.  CT images were obtained.  Surface markings were placed.  The CT images were loaded into the planning software and fused with the patient's targeting MRI scan.  Then the target and avoidance structures were contoured.  Treatment planning then occurred.  The radiation prescription was entered and confirmed.  I have requested 3D planning  I have requested a DVH of the following structures: Brain stem, brain, left eye, right eye, lenses, optic chiasm, target volumes, uninvolved brain, and normal tissue.    SPECIAL TREATMENT PROCEDURE:  The planned course of therapy using radiation constitutes a special treatment procedure. Special care is required in the management of this patient for the following reasons. This treatment constitutes a Special Treatment Procedure for the following reason: High dose per fraction requiring special monitoring for  increased toxicities of treatment including daily imaging.  The special nature of the planned course of radiotherapy will require increased physician supervision and oversight to ensure patient's safety with optimal treatment outcomes.  PLAN:  The patient will receive 20 Gy in 1 fraction.  ________________________________  Sheral Apley Tammi Klippel, M.D.

## 2019-12-30 ENCOUNTER — Telehealth: Payer: Self-pay | Admitting: Urology

## 2019-12-30 DIAGNOSIS — C7931 Secondary malignant neoplasm of brain: Secondary | ICD-10-CM | POA: Diagnosis not present

## 2019-12-30 DIAGNOSIS — Z510 Encounter for antineoplastic radiation therapy: Secondary | ICD-10-CM | POA: Diagnosis not present

## 2019-12-31 ENCOUNTER — Encounter: Payer: Self-pay | Admitting: Radiation Oncology

## 2019-12-31 ENCOUNTER — Telehealth: Payer: Self-pay | Admitting: Radiation Oncology

## 2019-12-31 ENCOUNTER — Telehealth: Payer: Self-pay | Admitting: *Deleted

## 2019-12-31 DIAGNOSIS — C3490 Malignant neoplasm of unspecified part of unspecified bronchus or lung: Secondary | ICD-10-CM | POA: Diagnosis not present

## 2019-12-31 DIAGNOSIS — R0902 Hypoxemia: Secondary | ICD-10-CM | POA: Diagnosis not present

## 2019-12-31 DIAGNOSIS — J9621 Acute and chronic respiratory failure with hypoxia: Secondary | ICD-10-CM | POA: Diagnosis not present

## 2019-12-31 DIAGNOSIS — J449 Chronic obstructive pulmonary disease, unspecified: Secondary | ICD-10-CM | POA: Diagnosis not present

## 2019-12-31 NOTE — Telephone Encounter (Signed)
Received voicemail message from patient inquiring if she could wear blue jeans for her srs treatment tomorrow. Phoned patient back to let her know this is ok. No answer. No option to leave a message.

## 2019-12-31 NOTE — Telephone Encounter (Signed)
Spoke to [employer] and they have been trying to get in touch with pt and have been unsuccessful. Emailed pt through my chart to call them back and provided number. [phone] Lelan Pons

## 2019-12-31 NOTE — Telephone Encounter (Signed)
Received VM from [facility] with [facility] [phone] telephone.   Reports that patient is due for oxygen re-certification. Advised to contact [employer] for further information.

## 2020-01-01 ENCOUNTER — Other Ambulatory Visit: Payer: Self-pay

## 2020-01-01 ENCOUNTER — Encounter: Payer: Self-pay | Admitting: Radiation Oncology

## 2020-01-01 ENCOUNTER — Ambulatory Visit
Admission: RE | Admit: 2020-01-01 | Discharge: 2020-01-01 | Disposition: A | Discharge status: Home / Self Care | Payer: PPO | Source: Ambulatory Visit | Attending: Radiation Oncology | Admitting: Radiation Oncology

## 2020-01-01 VITALS — BP 168/65 | HR 76 | Temp 98.7°F | Resp 20 | Ht 65.0 in

## 2020-01-01 DIAGNOSIS — C7931 Secondary malignant neoplasm of brain: Secondary | ICD-10-CM | POA: Diagnosis not present

## 2020-01-01 DIAGNOSIS — Z510 Encounter for antineoplastic radiation therapy: Secondary | ICD-10-CM | POA: Diagnosis not present

## 2020-01-01 NOTE — Progress Notes (Signed)
  Radiation Oncology         [phone] ________________________________  Stereotactic Treatment Procedure Note  Name: [name] MRN: [id number]  Date: 01/01/2020  DOB: July 27, 1941  SPECIAL TREATMENT PROCEDURE    ICD-10-CM   1. Brain metastasis ([place])  C79.31     3D TREATMENT PLANNING AND DOSIMETRY:  The patient's radiation plan was reviewed and approved by neurosurgery and radiation oncology prior to treatment.  It showed 3-dimensional radiation distributions overlaid onto the planning CT/MRI image set.  The [facility] for the target structures as well as the organs at risk were reviewed. The documentation of the 3D plan and dosimetry are filed in the radiation oncology EMR.  NARRATIVE:  [name] was brought to the TrueBeam stereotactic radiation treatment machine and placed supine on the CT couch. The head frame was applied, and the patient was set up for stereotactic radiosurgery.  Neurosurgery was present for the set-up and delivery  SIMULATION VERIFICATION:  In the couch zero-angle position, the patient underwent Exactrac imaging using the Brainlab system with orthogonal KV images.  These were carefully aligned and repeated to confirm treatment position for each of the isocenters.  The Exactrac snap film verification was repeated at each couch angle.  PROCEDURE: [name] received stereotactic radiosurgery to the following targets: PTV7 target was treated using 3 Dynamic Conformal Arcs to a prescription dose of 20 Gy.  ExacTrac registration was performed for each couch angle.  The 100% isodose line was prescribed with 127.4% hotspot.  6 MV X-rays were delivered in the flattening filter free beam mode. PTV6 target was treated using 3 Dynamic Conformal Arcs to a prescription dose of 20 Gy.  ExacTrac registration was performed for each couch angle.  The 100% isodose line was prescribed with 130.1% hotspot.  6 MV X-rays were delivered in the flattening filter free beam mode.   STEREOTACTIC TREATMENT MANAGEMENT:  Following delivery, the patient was transported to nursing in stable condition and monitored for possible acute effects.  Vital signs were recorded There were no vitals taken for this visit.. The patient tolerated treatment without significant acute effects, and was discharged to home in stable condition.    PLAN: Follow-up in one month.  ________________________________  Sheral Apley. Tammi Klippel, M.D.

## 2020-01-01 NOTE — Progress Notes (Signed)
Nurse monitoring complete following SRS treatment. Vitals stable. Patient denies new neurologic symptoms. Instructed patient to avoid strenuous activity for the next 24 hours. Explained if she has questions or concerns related to her srs treatment over the weekend to phone [phone] and request the radiation oncologist on call. Instructed the patient to present to the emergency room immediately with severe blinding headache, nausea and projectile vomiting. Patient verbalized understanding of all reviewed. Patient discharged home in wheelchair with husband. No distress noted.    BP (!) 168/65   Pulse 76   Temp 98.7 F (37.1 C)   Resp 20   Ht 5\' 5"  (1.651 m)   SpO2 94%   BMI 17.64 kg/m

## 2020-01-05 ENCOUNTER — Telehealth: Payer: Self-pay | Admitting: Radiation Therapy

## 2020-01-05 NOTE — Telephone Encounter (Signed)
I called to check in on [name] this morning. She has been "a little dizzy and queasy" but doesn't think that it is new. 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 He told her that this could be contributing to her feeling of being off balance or even nauseous. I have her set up to see Dr. Mickeal Skinner Monday 4/5 for evaluation. She feels safe because she has a walker to help her get around the house.    Mont Dutton R.T.(R)(T) Radiation Special Procedures Navigator

## 2020-01-07 ENCOUNTER — Telehealth: Payer: Self-pay | Admitting: Gastroenterology

## 2020-01-07 NOTE — Telephone Encounter (Signed)
Called pt and notified her the provider wanted her to come in to have a face to face visit so she examine her because the last visit was virtual.she stated  her bowels are moving better and that she has an appt to see [place] on 01/13/20 @ 11:30 and that she will see [place] then to discuss these issues, thanked me for the call.

## 2020-01-07 NOTE — Telephone Encounter (Signed)
Called pt and she wanted to know if the ct she had over a month ago showed any cancer and that the medication she was placed on (she did not know the name) is working much better

## 2020-01-07 NOTE — Telephone Encounter (Signed)
Pt has questions about what contrast will reveal in her recent scan. Please call patient at [phone]

## 2020-01-07 NOTE — Telephone Encounter (Signed)
Called lmom

## 2020-01-07 NOTE — Telephone Encounter (Signed)
We have discussed CT results with her previously but I wonder if she is referring about the mild omental haziness in LLQ that she was going to address with Dr. Delton Coombes.   I did have him look at it and he was not concerned.   Hopefully she is moving her bowels better on higher dose Linzess.   If she is still having abdominal pain, she needs OV (face to face) so we can adequately examine her. Her last visit was virtual.

## 2020-01-07 NOTE — Telephone Encounter (Signed)
Pt was returning call. [phone]

## 2020-01-09 NOTE — Telephone Encounter (Addendum)
Appt on 01/13/20 is listed as virtual. Please verify that patient is coming in and let's change to appropriate encounter. Thank you!

## 2020-01-11 ENCOUNTER — Inpatient Hospital Stay: Payer: PPO | Attending: Internal Medicine | Admitting: Internal Medicine

## 2020-01-11 ENCOUNTER — Other Ambulatory Visit: Payer: Self-pay

## 2020-01-11 VITALS — BP 115/84 | HR 75 | Temp 98.0°F | Resp 18 | Ht 65.0 in | Wt 99.8 lb

## 2020-01-11 DIAGNOSIS — C7931 Secondary malignant neoplasm of brain: Secondary | ICD-10-CM | POA: Insufficient documentation

## 2020-01-11 DIAGNOSIS — C3432 Malignant neoplasm of lower lobe, left bronchus or lung: Secondary | ICD-10-CM | POA: Insufficient documentation

## 2020-01-11 DIAGNOSIS — H547 Unspecified visual loss: Secondary | ICD-10-CM | POA: Diagnosis not present

## 2020-01-11 NOTE — Telephone Encounter (Signed)
Called patient and verified she is coming into the office and changed appointment

## 2020-01-11 NOTE — Progress Notes (Signed)
[facility] at [place], [place] [id number] [phone]  Interval Evaluation  Date of Service: 01/11/20 Patient Name: [name] Patient MRN: [id number] Patient DOB: May 07, 1941 Provider: Ventura Sellers, MD  Identifying Statement:  [name] is a 79 y.o. female with Brain metastases ([place]) [C79.31] who presents for initial consultation and evaluation regarding cancer associated neurologic deficits.    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 0/14 lymph nodes.  No perineural invasion or LVI.  negative margins.   06/05/2016 Imaging   Although the previously described ground-glass attenuation nodule in the superior segment of the right lower lobe measures slightly larger on today's examination than prior studies, this is favored to be technique related. Today's study should serve as a baseline for future followup examinations. At this time, there is no central solid component to this lesion. No new nodules are noted. 2. Stable postoperative scarring in the left lower lobe and post infectious scarring throughout the visualize lung bases, as above. 3. Areas of cylindrical bronchiectasis throughout the lung bases bilaterally    05/09/2017 Imaging   IMPRESSION: 1. Status post left lower lobe wedge resection. No findings to suggest local recurrence of disease or definite metastatic disease in the thorax. Previously noted ground-glass attenuation nodule in the superior segment of the right lower lobe appears slightly smaller than the prior study, suggesting a benign etiology. 2. Diffuse bronchial wall thickening with moderate to severe centrilobular and paraseptal emphysema;  imaging findings suggestive of underlying COPD. 3. Aortic atherosclerosis, in addition to left main and left circumflex coronary artery disease. Assessment for potential risk factor modification, dietary therapy or pharmacologic therapy may be warranted, if clinically indicated. 4. Increasing anterior pericardial fluid and/or thickening. This is unlikely to be of hemodynamic significance at this time. No associated pericardial calcification. 5. New (but non acute) compression fracture of T6 with approximately 90% loss of anterior vertebral body height, in addition to multiple other chronic compression fractures of the visualize the thoracolumbar spine.     CNS Oncologic History 03/17/19:  Pre-Op SRS brain in a single fraction to the following targets: PTV1 Post Lt Parietal 39mm 18Gy PTV2 Lt Cerebellar 61mm 20Gy PTV3 Rt Temp 39mm 20Gy PTV4 Post Lt Insular 64mm 20 Gy PTV5 Lt Frontal Gyrus 35mm 20Gy 03/18/19: Left parietal craniotomy for resection of tumor with Dr. Kathyrn Sheriff  01/01/20: SRS to 2 additional sub-cm metastases  Interval History: The patient's records from the referring physician were obtained and reviewed and the patient interviewed to confirm this HPI.  [name] presents for clinical evaluation today.  She describes no new or progressive neurologic deficits.  Right sided visual field impairment in static as prior since surgery.  She walks independently or with a walker.  No seizures or headaches.  Not dosing any decadron.  Medications: Current Outpatient Medications on File Prior to Visit  Medication Sig Dispense Refill  . albuterol (VENTOLIN HFA) 108 (90 Base) MCG/ACT inhaler Inhale 2 puffs into the lungs every 6 (six) hours as needed for wheezing or shortness of breath. 18 g 2  . ALPRAZolam (XANAX) 0.5 MG tablet TAKE (1) TABLET BY MOUTH TWICE A DAY AS NEEDED. (Patient taking differently: at bedtime. ) 60 tablet 3  .  citalopram (CELEXA) 40 MG tablet TAKE (1/2) TABLET  BY MOUTH AT BEDTIME. 45 tablet 5  . denosumab (PROLIA) 60 MG/ML SOSY injection Inject 60 mg into the skin every 6 (six) months. 1 mL 0  . ferrous sulfate 325 (65 FE) MG tablet Take 325 mg by mouth daily with breakfast.    . levocetirizine (XYZAL) 5 MG tablet TAKE ONE TABLET BY MOUTH ONCE DAILY. 30 tablet 5  . levothyroxine (SYNTHROID) 50 MCG tablet TAKE 1 TABLET BEFORE BREAKFAST. 90 tablet 0  . linaclotide (LINZESS) 145 MCG CAPS capsule Take 1 capsule (145 mcg total) by mouth daily before breakfast. 30 capsule 3  . Multiple Vitamin (MULTIVITAMIN WITH MINERALS) TABS tablet Take 1 tablet by mouth daily.    . nitrofurantoin, macrocrystal-monohydrate, (MACROBID) 100 MG capsule Take 1 capsule (100 mg total) by mouth 2 (two) times daily. 14 capsule 0  . pantoprazole (PROTONIX) 40 MG tablet TAKE (1) TABLET BY MOUTH ONCE DAILY BEFORE BREAKFAST. 90 tablet 3  . tiZANidine (ZANAFLEX) 4 MG tablet TAKE (1) TABLET BY MOUTH EVERY SIX HOURS AS NEEDED FOR MUSCLE SPASMS. (Patient taking differently: as needed. ) 30 tablet 2  . TRELEGY ELLIPTA 100-62.5-25 MCG/INH AEPB INHALE 1 PUFF INTO LUNGS DAILY. 60 each 5   No current facility-administered medications on file prior to visit.    Allergies:  Allergies  Allergen Reactions  . Ciprofloxacin Hcl Hives  . Sulfonamide Derivatives Hives and Rash  . Iohexol Other (See Comments)    UNSPECIFIED REACTION  Desc: Per [facility], pt is allergic to IV contrast, no type of reaction was available. Pt cannot remember but first reacted around 15 yrs ago from an IVP. Notes were date from 2009 at urology center., Onset Date: [id number]   . Prozac [Fluoxetine Hcl] Rash   Past Medical History:  Past Medical History:  Diagnosis Date  . 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 Allergic rhinitis   . 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 Anxiety disorder   . Aortic aneurysm ([place])   . Brain metastasis ([place])    lung  cancer s/p resection 7/20  . Complication of anesthesia 2011   bloodpressure dropped during colonoscopy, not problems since  . Compression fracture   . COPD (chronic obstructive pulmonary disease) ([place])   . Depression   . Diastolic dysfunction   . Diverticulosis   . Dysphagia   . Emphysema lung ([place]) 11/08/2014  . Headache   . Hiatal hernia   . History of kidney stones   . Hypothyroidism   . IBS (irritable bowel syndrome)   . Iron deficiency anemia due to chronic blood loss 12/05/2016  . On home O2 12/15/2013   chronic hypoxia  . Pneumonia   . PUD (peptic ulcer disease)    74yrs  . Shortness of breath    with exertion  . SIADH (syndrome of inappropriate ADH production) ([place])   . Trigeminal neuralgia    Past Surgical History:  Past Surgical History:  Procedure Laterality Date  . ABDOMINAL HYSTERECTOMY    . APPLICATION OF CRANIAL NAVIGATION N/A 03/18/2019   Procedure: APPLICATION OF CRANIAL NAVIGATION;  Surgeon: Consuella Lose, MD;  Location: [facility];  Service: Neurosurgery;  Laterality: N/A;  . BACK SURGERY  January 13, 2014  . CATARACT EXTRACTION    . CATARACT EXTRACTION W/PHACO  09/02/2012   Procedure: CATARACT EXTRACTION PHACO AND INTRAOCULAR LENS PLACEMENT (IOC);  Surgeon: Elta Guadeloupe T. Gershon Crane, MD;  [facility];  Service: Ophthalmology;  Laterality: Left;  CDE:10.35  . CHOLECYSTECTOMY    . 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 COLONOSCOPY N/A 11/23/2014   Dr. Oneida Alar: moderate diverticulosis, hemorrhoids, redundant colon. next TCS in 10-15 years.   . CRANIOTOMY Left 03/18/2019   Procedure: Left stereotactic craniotomy for tumor resection;  Surgeon: Consuella Lose, MD;  Location: [facility];  Service: Neurosurgery;  Laterality: Left;  Left stereotactic craniotomy for tumor resection  . ENDARTERECTOMY Right 08/21/2013   Procedure: RIGHT CAROTID ANEURYSM RESECTION;  Surgeon: Rosetta Posner, MD;  Location: [facility];  Service: Vascular;  Laterality:  Right;  . ESOPHAGEAL DILATION  02/24/2018   Procedure: ESOPHAGEAL DILATION;  Surgeon: Danie Binder, MD;  [facility];  Service: Endoscopy;;  . ESOPHAGOGASTRODUODENOSCOPY  11/13/2009   w/dilation to 62mm, gastric ulceration (H.Pylori) s/p treatment  . ESOPHAGOGASTRODUODENOSCOPY  11/21/2009   distal esophageal web, gastritis  . ESOPHAGOGASTRODUODENOSCOPY  03/14/12   FUX:NATFTDDUK in the distal esophagus/Mild gastritis/small HH. + H.pylori, prescribed Pylera. Finished treatment.   . ESOPHAGOGASTRODUODENOSCOPY N/A 12/13/2017   Dr. Oneida Alar: esophageal web s/p dilation, gastritis, no h.pylori  . ESOPHAGOGASTRODUODENOSCOPY N/A 02/24/2018   Dr. Oneida Alar: web in prox esophagus and in distal esophagus s/p dilation, mild gastritis, mild pyloric stenosis, mild post ulcer duodenal deformity at D1/D2  . fatty tumor removal from lt groin    . FRACTURE SURGERY Left    hand and left ring finger  . GIVENS CAPSULE STUDY N/A 12/26/2017   incomplete  . GIVENS CAPSULE STUDY N/A 02/24/2018   normal small bowel  . KNEE SURGERY     patella tendon repair june 2019 [facility]  . LUNG CANCER SURGERY  12/2010   Left VATS, minithoracotomy, LLL superior segmentectomy  . ORIF PATELLA Right 03/18/2018   Procedure: OPEN REDUCTION INTERNAL (ORIF) FIXATION RIGHT PATELLA;  Surgeon: Carole Civil, MD;  [facility];  Service: Orthopedics;  Laterality: Right;  . ORIF WRIST FRACTURE Left 04/09/2014   Procedure: OPEN REDUCTION INTERNAL FIXATION (ORIF) WRIST FRACTURE;  Surgeon: Renette Butters, MD;  Location: [facility];  Service: Orthopedics;  Laterality: Left;  . PERCUTANEOUS PINNING Left 04/09/2014   Procedure: PERCUTANEOUS PINNING EXTREMITY;  Surgeon: Renette Butters, MD;  Location: [facility];  Service: Orthopedics;  Laterality: Left;  . SAVORY DILATION N/A 12/13/2017   Procedure: SAVORY DILATION;  Surgeon: Danie Binder, MD;  [facility];  Service: Endoscopy;  Laterality: N/A;  . TUBAL LIGATION    . vocal cord  surgery  02/06/2011   laryngoscopy with bilateral vocal cord Radiesse injection for vocal cord paralysis  . YAG LASER APPLICATION Left 02/54/2706   Procedure: YAG LASER APPLICATION;  Surgeon: Rutherford Guys, MD;  [facility];  Service: Ophthalmology;  Laterality: Left;   Social History:  Social History   Socioeconomic History  . Marital status: Divorced    Spouse name: Not on file  . Number of children: Not on file  . Years of education: Not on file  . Highest education level: Not on file  Occupational History  . Not on file  Tobacco Use  . Smoking status: Former Smoker    Packs/day: 0.50    Years: 20.00    Pack years: 10.00    Types: Cigarettes    Quit date: 12/21/2010    Years since quitting: 9.0  . Smokeless tobacco: Never Used  . Tobacco comment: smoking cessation info given and reviewed  Substance and Sexual Activity  . Alcohol use: Not Currently    Alcohol/week: 0.0 standard drinks    Comment: seldom  . Drug use: No  . Sexual activity: Yes    Birth control/protection: Surgical  Other Topics Concern  . Not on file  Social History Narrative  . Not on file   Social Determinants of Health   Financial Resource Strain:   . Difficulty of Paying Living Expenses:   Food Insecurity:   . Worried About Charity fundraiser in the Last Year:   . Arboriculturist in the Last Year:   Transportation Needs:   . Film/video editor (Medical):   Marland Kitchen Lack of Transportation (Non-Medical):   Physical Activity:   . Days of Exercise per Week:   . Minutes of Exercise per Session:   Stress:   . Feeling of Stress :   Social Connections:   . Frequency of Communication with Friends and Family:   . Frequency of Social Gatherings with Friends and Family:   . Attends Religious Services:   . Active Member of Clubs or Organizations:   . Attends Archivist Meetings:   Marland Kitchen Marital Status:   Intimate Partner Violence:   . Fear of Current or Ex-Partner:   . Emotionally Abused:     Marland Kitchen Physically Abused:   . Sexually Abused:    Family History:  Family History  Problem Relation Age of Onset  . Pulmonary embolism Mother   . Heart attack Father   . Hypertension Father   . Liver cancer Sister   . Cancer Sister   . Cancer Brother   . Cancer Daughter   . Colon cancer Neg Hx     Review of Systems: Constitutional: Doesn't report fevers, chills or abnormal weight loss Eyes: Doesn't report blurriness of vision Ears, nose, mouth, throat, and face: Doesn't report sore throat Respiratory: Doesn't report cough, dyspnea or wheezes Cardiovascular: Doesn't report palpitation, chest discomfort  Gastrointestinal:  Doesn't report nausea, constipation, diarrhea GU: Doesn't report incontinence Skin: Doesn't report skin rashes Neurological: Per HPI Musculoskeletal: Doesn't report joint pain Behavioral/Psych: Doesn't report anxiety  Physical Exam: Vitals:   01/11/20 0930  BP: 115/84  Pulse: 75  Resp: 18  Temp: 98 F (36.7 C)  SpO2: 96%   KPS: 80. General: Alert, cooperative, pleasant, in no acute distress Head: Normal EENT: No conjunctival injection or scleral icterus.  Lungs: Resp effort normal Cardiac: Regular rate Abdomen: Non-distended abdomen Skin: No rashes cyanosis or petechiae. Extremities: No clubbing or edema  Neurologic Exam: Mental Status: Awake, alert, attentive to examiner. Oriented to self and environment. Language is fluent with intact comprehension.  Cranial Nerves: Visual acuity is grossly normal. Right homonymous hemianopia. Extra-ocular movements intact. No ptosis. Face is symmetric Motor: Tone and bulk are normal. Power is full in both arms and legs. Reflexes are symmetric, no pathologic reflexes present.  Sensory: Intact to light touch Gait: Normal.   Labs: I have reviewed the data as listed    Component Value Date/Time   NA 138 11/27/2019 1448   K 4.8 11/27/2019 1448   CL 98 11/27/2019 1448   CO2 31 11/27/2019 1448   GLUCOSE 87  11/27/2019 1448   BUN 24 11/27/2019 1448   CREATININE 0.92 11/27/2019 1448   CALCIUM 10.2 11/27/2019 1448   PROT 6.2 11/27/2019 1448   ALBUMIN 4.4 06/04/2019 1228   AST 24 11/27/2019 1448   ALT 34 (H) 11/27/2019 1448   ALKPHOS 127 (H) 06/04/2019  1228   BILITOT 0.4 11/27/2019 1448   GFRNONAA 60 11/27/2019 1448   GFRAA 69 11/27/2019 1448   Lab Results  Component Value Date   WBC 6.7 11/27/2019   NEUTROABS 4,871 11/27/2019   HGB 13.6 11/27/2019   HCT 40.8 11/27/2019   MCV 91.5 11/27/2019   PLT 269 11/27/2019     Assessment/Plan Brain metastases ([facility]) [C79.31]  Berlie S Disney is clinically stable today, having recently completed radiosurgery for progressive metastases.  We reviewed recent imaging and discussed the mechanics and anatomy of visual field impairment today.  She will defer OT assessment and she has follow up with her ophthalmologist scheduled.  We ask that FATISHA RABALAIS return to clinic in 3 months following next brain MRI, or sooner as needed.  We spent twenty additional minutes teaching regarding the natural history, biology, and historical experience in the treatment of neurologic complications of cancer.   We appreciate the opportunity to participate in the care of [place].   All questions were answered. The patient knows to call the clinic with any problems, questions or concerns. No barriers to learning were detected.  The total time spent in the encounter was 40 minutes and more than 50% was on counseling and review of test results   Ventura Sellers, MD Medical Director of Neuro-Oncology [facility] at [place] 01/11/20 4:07 PM

## 2020-01-13 ENCOUNTER — Ambulatory Visit (INDEPENDENT_AMBULATORY_CARE_PROVIDER_SITE_OTHER): Payer: PPO | Admitting: Gastroenterology

## 2020-01-13 ENCOUNTER — Other Ambulatory Visit: Payer: Self-pay

## 2020-01-13 ENCOUNTER — Encounter: Payer: Self-pay | Admitting: Gastroenterology

## 2020-01-13 VITALS — BP 101/67 | HR 79 | Temp 97.5°F | Ht 66.0 in | Wt 109.4 lb

## 2020-01-13 DIAGNOSIS — K5900 Constipation, unspecified: Secondary | ICD-10-CM | POA: Diagnosis not present

## 2020-01-13 DIAGNOSIS — R140 Abdominal distension (gaseous): Secondary | ICD-10-CM | POA: Diagnosis not present

## 2020-01-13 MED ORDER — LINACLOTIDE 145 MCG PO CAPS: 145.0000 ug | ORAL_CAPSULE | Freq: Every day | ORAL | 5 refills | Status: DC

## 2020-01-13 NOTE — Progress Notes (Signed)
Primary Care Physician: [name], MD  Primary Gastroenterologist:  Barney Drain, MD   Chief Complaint  Patient presents with  . Constipation    HPI: [name] is a 79 y.o. female here for follow up. 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  Hospitalized in August with aspiration pneumonia.  H/O IDA with prior need for iron infusions, last time in 2019. EGD/TCS/[place] as outlined under [employer]. 03/2019: left parietal adenocarcinoma.  Last seen in February 2021 through virtual visit.  Had been doing well from GI standpoint until in hospital in 05/2019 with PNA. Had poor appetite, could not eat, started having a lot of gas/gurgling noises in abdomen. Had tried dicyclomine, Phazyme with little improvement. After discharge had started Dulcolax at bedtime to regulate bowels. Typically would have midday abdominal cramping, gas, gurgling.  Historically with chronic constipation, previously failed Amitiza, MiraLAX, low-dose Linzess.  We added probiotic.  Had her stop dicyclomine since she was not seeing any significant benefit.  She called in several weeks later complaining of mid abdominal pain with no improvement since her office visit. CT abdomen and pelvis with contrast on March 1: Fair amount of stool seen in the colon.  No acute findings to explain patient's abdominal pain.  Mild omental haziness in the left lower quadrant, nonspecific.  Discussed with Dr. Delton Coombes who voiced no concern regarding this finding.  Abdominal aortic aneurysm, stable, recommended 3-year ultrasound.  Appetite is good. Feels like Align has helped a lot with abdominal noises, gas. Feels like stools are not adequate/complete. Thinks it is contributing to her bloating and abdominal discomfort. Abdominal pain better, was located 3 inches left of the umbilicus. No heartburn. No melena, brbpr. Weight stable  since 06/2019.   Current Outpatient Medications  Medication Sig Dispense Refill  . albuterol (VENTOLIN HFA) 108 (90 Base) MCG/ACT inhaler Inhale 2 puffs into the lungs every 6 (six) hours as needed for wheezing or shortness of breath. 18 g 2  . ALPRAZolam (XANAX) 0.5 MG tablet TAKE (1) TABLET BY MOUTH TWICE A DAY AS NEEDED. (Patient taking differently: at bedtime. ) 60 tablet 3  . citalopram (CELEXA) 40 MG tablet TAKE (1/2) TABLET BY MOUTH AT BEDTIME. 45 tablet 5  . denosumab (PROLIA) 60 MG/ML SOSY injection Inject 60 mg into the skin every 6 (six) months. 1 mL 0  . ferrous sulfate 325 (65 FE) MG tablet Take 325 mg by mouth daily with breakfast.    . levocetirizine (XYZAL) 5 MG tablet TAKE ONE TABLET BY MOUTH ONCE DAILY. 30 tablet 5  . levothyroxine (SYNTHROID) 50 MCG tablet TAKE 1 TABLET BEFORE BREAKFAST. 90 tablet 0  . Multiple Vitamin (MULTIVITAMIN WITH MINERALS) TABS tablet Take 1 tablet by mouth daily.    Marland Kitchen OVER THE COUNTER MEDICATION Bone Support 2-4 tabs daily    . pantoprazole (PROTONIX) 40 MG tablet TAKE (1) TABLET BY MOUTH ONCE DAILY BEFORE BREAKFAST. 90 tablet 3  . Probiotic Product (ALIGN PO) Take by mouth daily.    Marland Kitchen tiZANidine (ZANAFLEX) 4 MG tablet TAKE (1) TABLET BY MOUTH EVERY SIX HOURS AS NEEDED FOR MUSCLE SPASMS. (Patient taking differently: as needed. ) 30 tablet 2  . TRELEGY ELLIPTA 100-62.5-25 MCG/INH AEPB INHALE 1 PUFF INTO LUNGS DAILY. 60 each 5  . linaclotide (LINZESS) 145 MCG CAPS capsule Take 1 capsule (145 mcg total) by mouth daily before breakfast. 30  capsule 3   No current facility-administered medications for this visit.    Allergies as of 01/13/2020 - Review Complete 01/13/2020  Allergen Reaction Noted  . Ciprofloxacin hcl Hives 07/23/2011  . Sulfonamide derivatives Hives and Rash   . Iohexol Other (See Comments) 10/31/2010  . Prozac [fluoxetine hcl] Rash 05/11/2011    ROS:  General: Negative for anorexia, weight loss, fever, chills, fatigue,  weakness. ENT: Negative for hoarseness, difficulty swallowing , nasal congestion. CV: Negative for chest pain, angina, palpitations, dyspnea on exertion, peripheral edema.  Respiratory: Negative for dyspnea at rest, dyspnea on exertion, cough, sputum, wheezing.  GI: See history of present illness. GU:  Negative for dysuria, hematuria, urinary incontinence, urinary frequency, nocturnal urination.  Endo: Negative for unusual weight change.    Physical Examination:   BP 101/67   Pulse 79   Temp (!) 97.5 F (36.4 C) (Temporal)   Ht 5\' 6"  (1.676 m)   Wt 109 lb 6.4 oz (49.6 kg)   BMI 17.66 kg/m   General: thin well-developed in no acute distress. kyphotic Eyes: No icterus. Mouth: masked. Lungs: Clear to auscultation bilaterally.  Heart: Regular rate and rhythm, no murmurs rubs or gallops.  Abdomen: Bowel sounds are normal, nontender, weakened abdominal wall muscles without obvious hernia, no hepatosplenomegaly or masses, no abdominal bruits, no rebound or guarding.   Extremities: No lower extremity edema. No clubbing or deformities. Neuro: Alert and oriented x 4   Skin: Warm and dry, no jaundice.   Psych: Alert and cooperative, normal mood and affect.  Labs:  Lab Results  Component Value Date   CREATININE 0.92 11/27/2019   BUN 24 11/27/2019   NA 138 11/27/2019   K 4.8 11/27/2019   CL 98 11/27/2019   CO2 31 11/27/2019   Lab Results  Component Value Date   WBC 6.7 11/27/2019   HGB 13.6 11/27/2019   HCT 40.8 11/27/2019   MCV 91.5 11/27/2019   PLT 269 11/27/2019   Lab Results  Component Value Date   ALT 34 (H) 11/27/2019   AST 24 11/27/2019   ALKPHOS 127 (H) 06/04/2019   BILITOT 0.4 11/27/2019    Imaging Studies: MR Brain W Wo Contrast  Result Date: 12/24/2019 CLINICAL DATA:  Metastatic lung cancer post resection and SRS EXAM: MRI HEAD WITHOUT AND WITH CONTRAST TECHNIQUE: Multiplanar, multiecho pulse sequences of the brain and surrounding structures were obtained  without and with intravenous contrast. CONTRAST:  37mL MULTIHANCE GADOBENATE DIMEGLUMINE 529 MG/ML IV SOLN COMPARISON:  09/24/2019 FINDINGS: Brain: Hemosiderin lined resection cavity underlying left parieto-occipital craniotomy again identified with further contraction. Stable thin extra-axial collection. Increased enhancement at the anterosuperior aspect of the cavity (series 12, image 11). The extent of surrounding T2 FLAIR hyperintensity again remains similar. There is a 3 mm enhancing lesion along the internal capsule (series 11, image 80). This was punctate on the prior study. There is a 5 mm enhancing left frontal lesion along the falx (series 12, image 42). This was also previously punctate dating back to 06/26/2019. No hydrocephalus. Prominence of the ventricles and sulci reflects stable parenchymal volume loss. Additional patchy T2 hyperintensity in the supratentorial and pontine white matter is nonspecific but may reflect chronic microvascular ischemic changes. There is no acute infarction. Vascular: Major vessel flow voids at the skull base are preserved. Skull and upper cervical spine: Normal marrow signal is preserved. Sinuses/Orbits: Paranasal sinuses are aerated. Orbits are unremarkable. Other: Sella is unremarkable.  Mastoid air cells are clear. IMPRESSION: Increased enhancement at the  anterosuperior aspect of the surgical cavity. This is probably treatment related but attention on follow-up is recommended. 3 mm suspected metastasis along the inferior right internal capsule and 5 mm suspected left frontal metastasis along the falx. Electronically Signed   By: Macy Mis M.D.   On: 12/24/2019 15:23   Assessment and Plan:  Pleasant 79 year old female with multiple comorbidities as outlined above, 2 significant hospitalizations in the last year, recent UTI completing antibiotic therapy, presenting for follow-up of abdominal gas/cramping, bloating but no diarrhea.    Fortunately her weight loss  stabilized several months back.  She lost a lot of weight during her hospitalizations but has been stable around 110 pounds.  Her appetite is improved.  CT for abdominal pain recently unremarkable. See above for details.  She has noted improvement of abdominal bloating and gas with use of align.  Continues to have issues with constipation, incomplete bowel movements.  She never picked up the [place] previously prescribed. She is willing to try Linzess.  Sent new RX for 145 mcg daily.  Continue probiotics.  She will call with progress toward a couple of weeks.

## 2020-01-13 NOTE — Patient Instructions (Signed)
1. Trial of Linzess 174mcg daily on empty stomach. I have resent your prescription to the pharmacy.  2. Continue probiotics for now.  3. Call in a couple of weeks and let me know how you are doing.

## 2020-01-13 NOTE — Progress Notes (Signed)
Cc'ed to pcp °

## 2020-01-19 ENCOUNTER — Other Ambulatory Visit: Payer: Self-pay | Admitting: Family Medicine

## 2020-01-19 IMAGING — RF DG KNEE 1-2V*R*
1 series · 3 of 3 positions shown · non-contrast
Comparison: 03/06/2018

CLINICAL DATA: Patellar fracture

EXAM:
DG C-ARM 61-120 MIN; RIGHT KNEE - 1-2 VIEW

[Series 1: run · 3 of 3 slices shown]
[im 1/3]
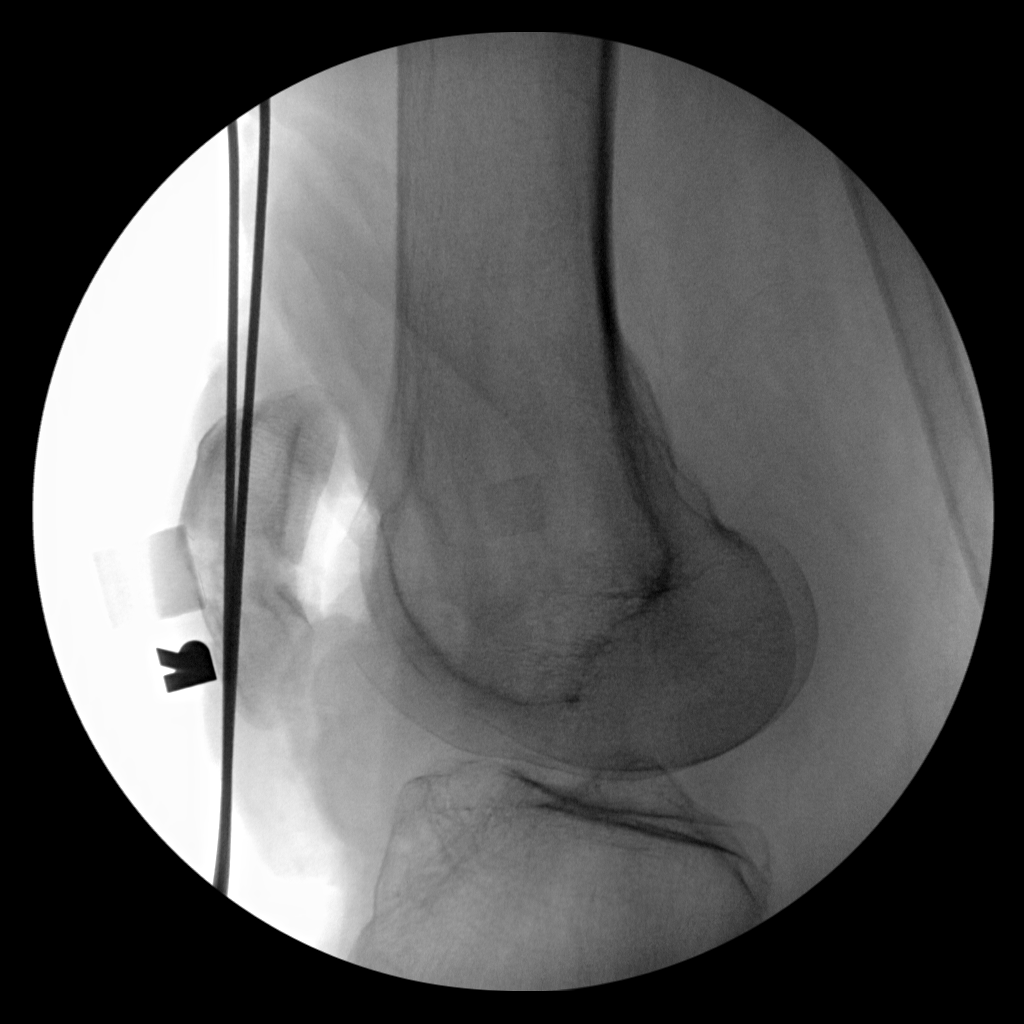
[im 2/3]
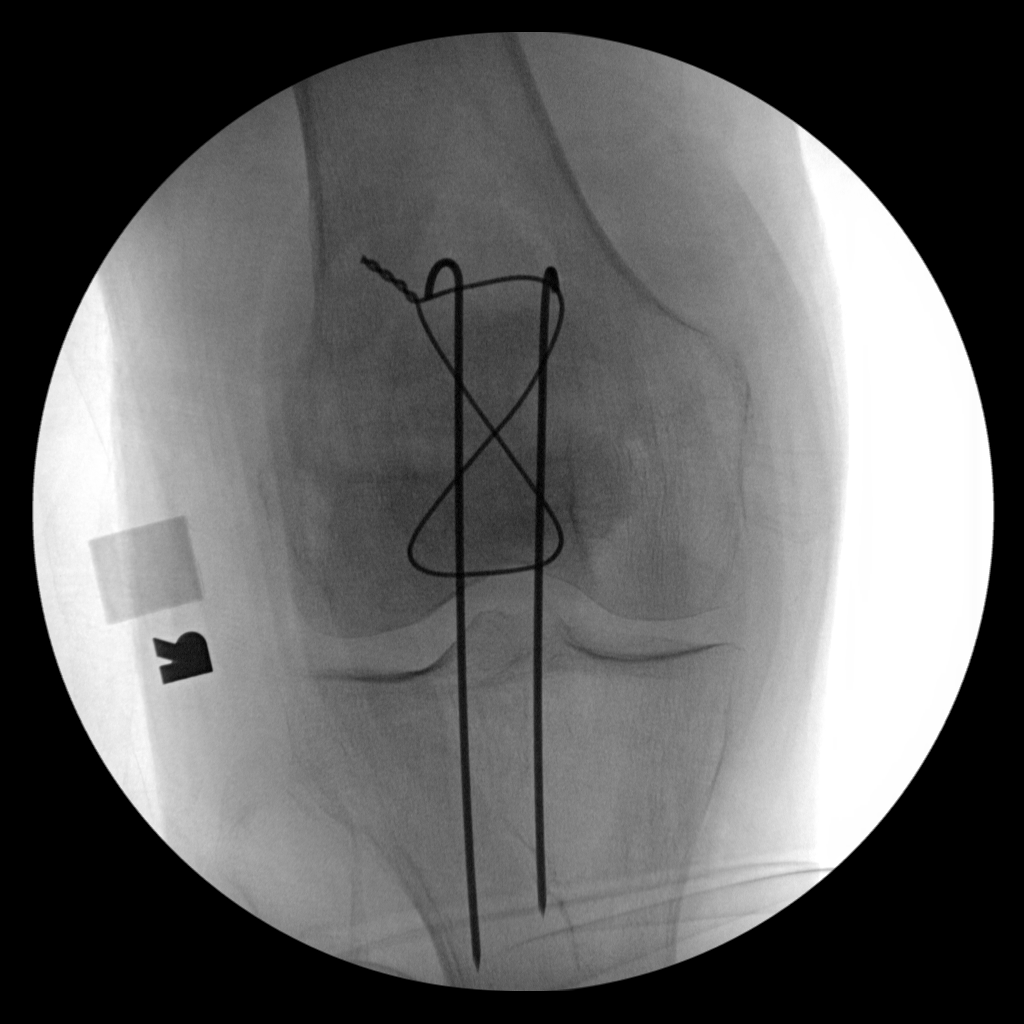
[im 3/3]
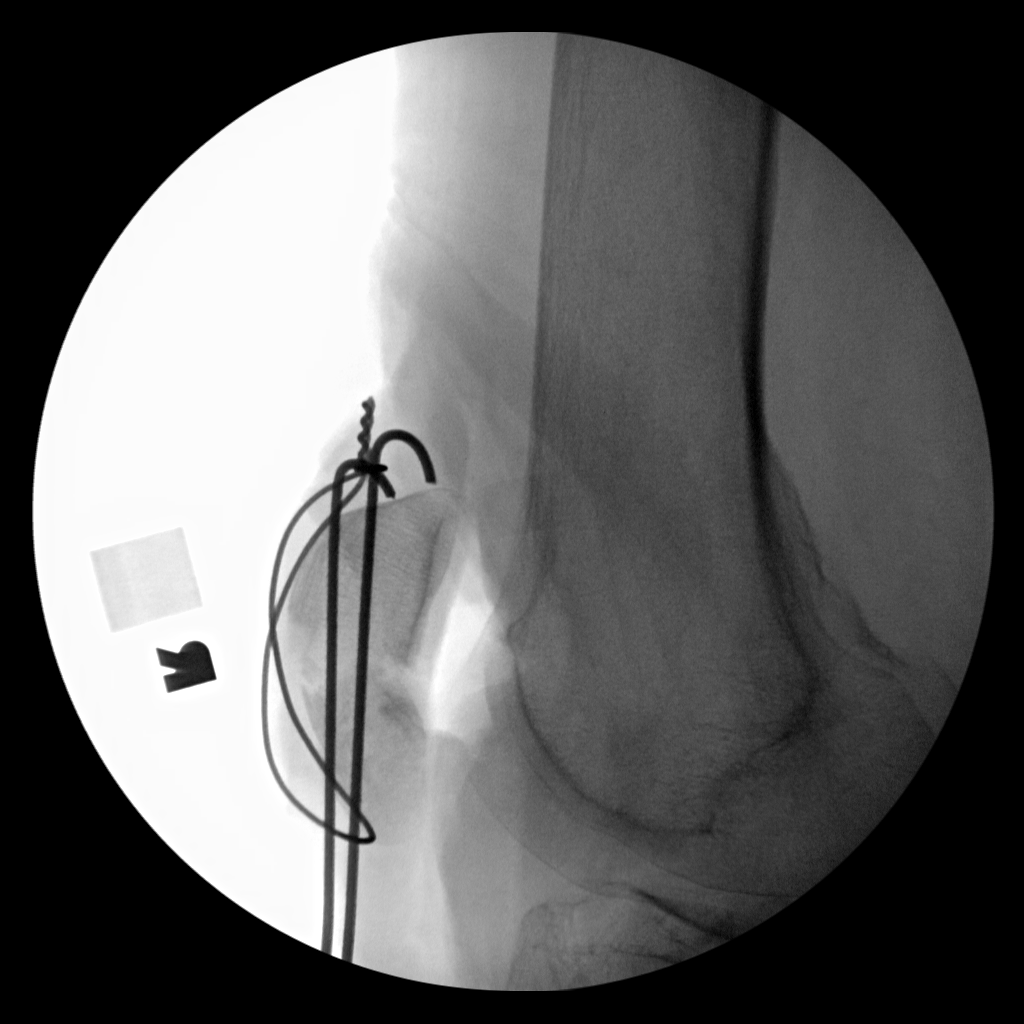

[3 of 3 positions shown; findings below may reference images not displayed]

FLUOROSCOPY TIME:  Fluoroscopy Time:  2.93 mGy

Radiation Exposure Index (if provided by the fluoroscopic device):
57 seconds

Number of Acquired Spot Images: 3
FINDINGS: Fixation pins are regionally noted through the patellar fracture. A
fixation wire is subsequently seen with some reduction in the
distraction of the fracture fragments.
IMPRESSION: ORIF of patellar fracture

## 2020-01-19 NOTE — Telephone Encounter (Signed)
Ok to refill Zanaflex? 

## 2020-01-20 NOTE — Progress Notes (Signed)
  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  ExacTrac registration was performed for each couch angle.  The 100% isodose line was prescribed with 127.4% hotspot.  6 MV X-rays were delivered in the flattening filter free beam mode. PTV6 target was treated using 3 Dynamic Conformal Arcs to a prescription dose of 20 Gy.  ExacTrac registration was performed for each couch angle.  The 100% isodose line was prescribed with 130.1% hotspot.  6 MV X-rays were delivered in the flattening filter free beam mode.  Narrative: The patient tolerated radiation treatment relatively well.   No complications were noted.  Plan: The patient has completed radiation treatment. The patient will return to radiation oncology clinic for routine followup in one month. I advised her to call or return sooner if she has any questions or concerns related to her recovery or treatment. ________________________________  Sheral Apley. Tammi Klippel, M.D.

## 2020-01-23 IMAGING — DX DG KNEE COMPLETE 4+V*R*
4 series · 4 of 4 positions shown · non-contrast
Comparison: 03/06/2018

CLINICAL DATA: Patella fracture, swelling and redness

EXAM:
RIGHT KNEE - COMPLETE 4+ VIEW

[knee ap]
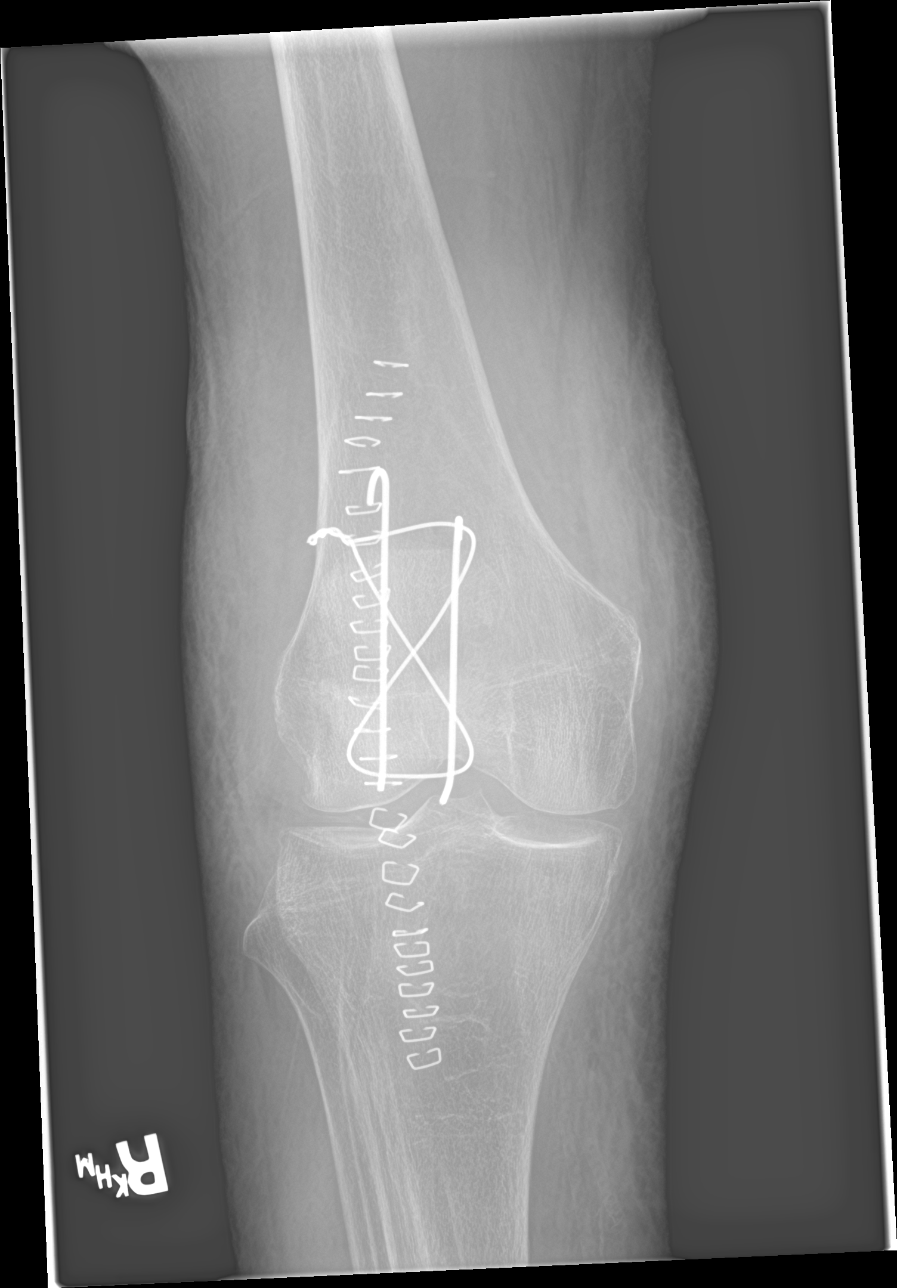

[tunnel]
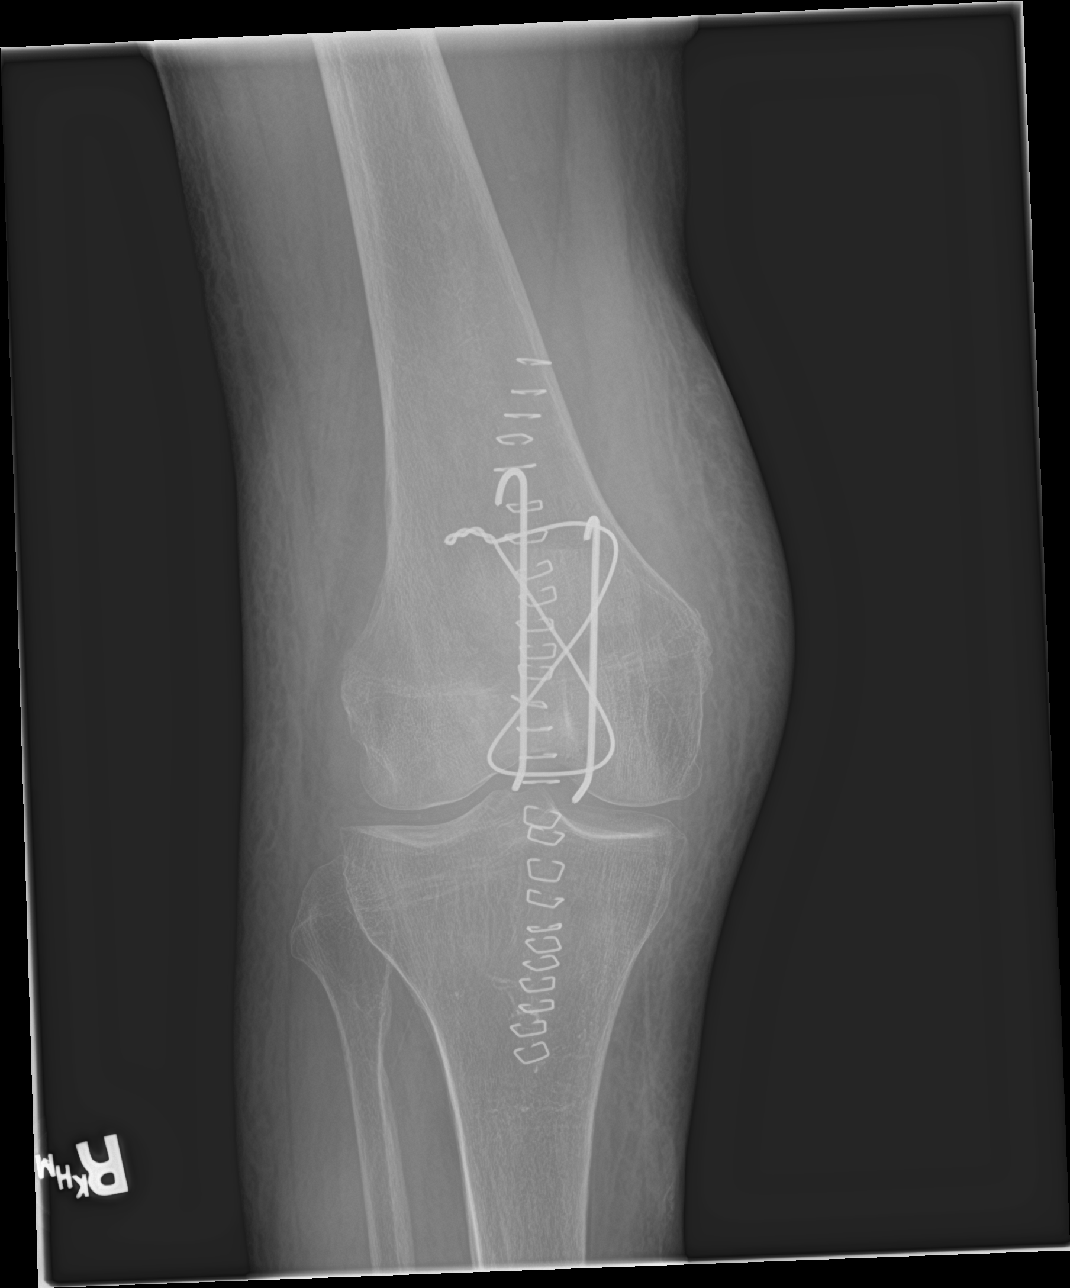

[knee lat]
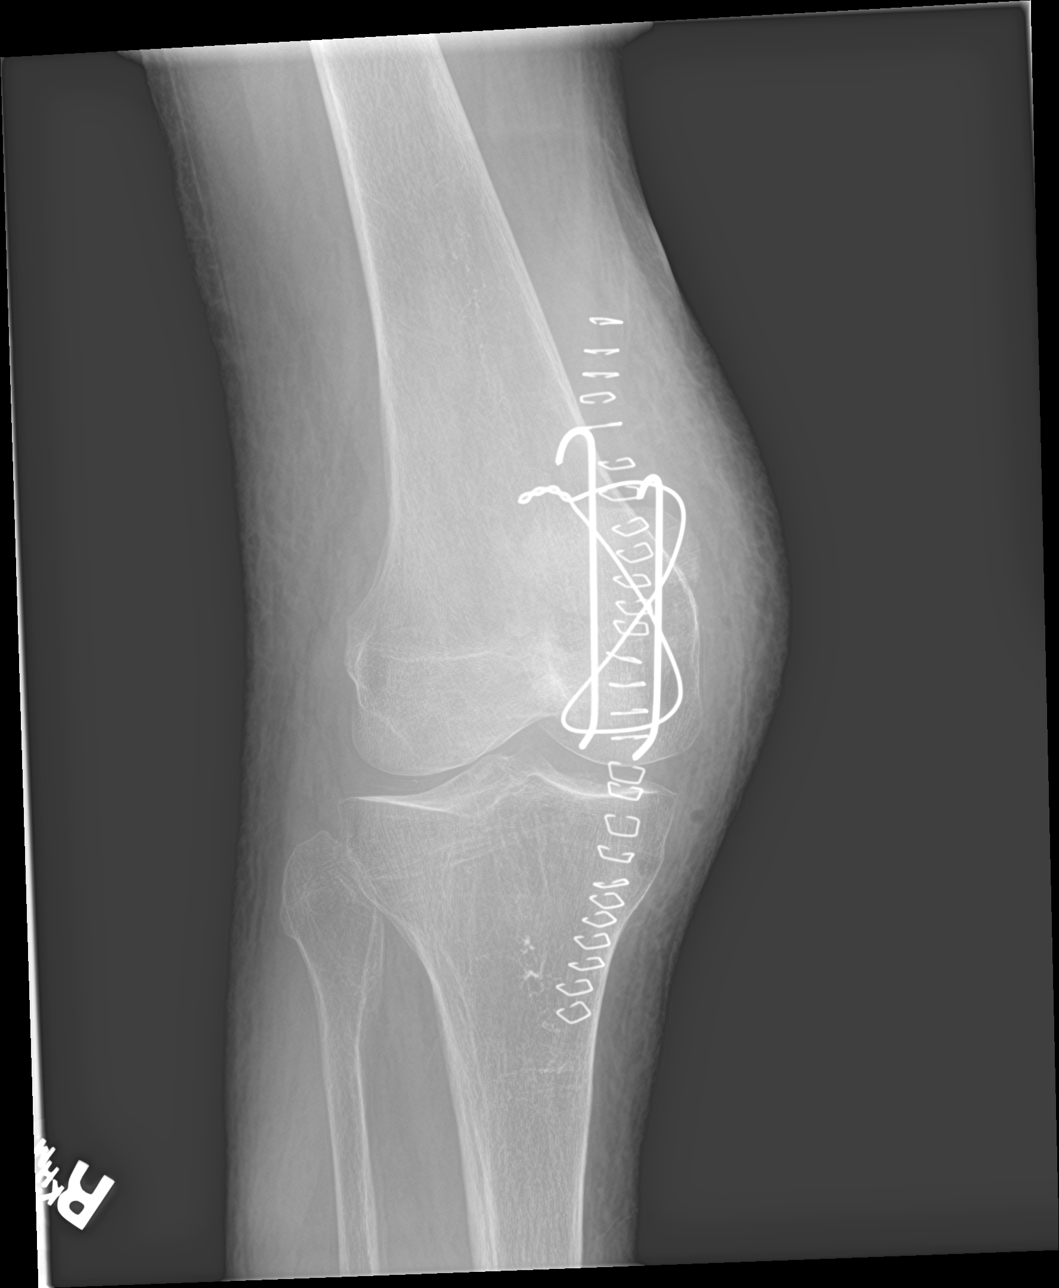

[knee sunrise]
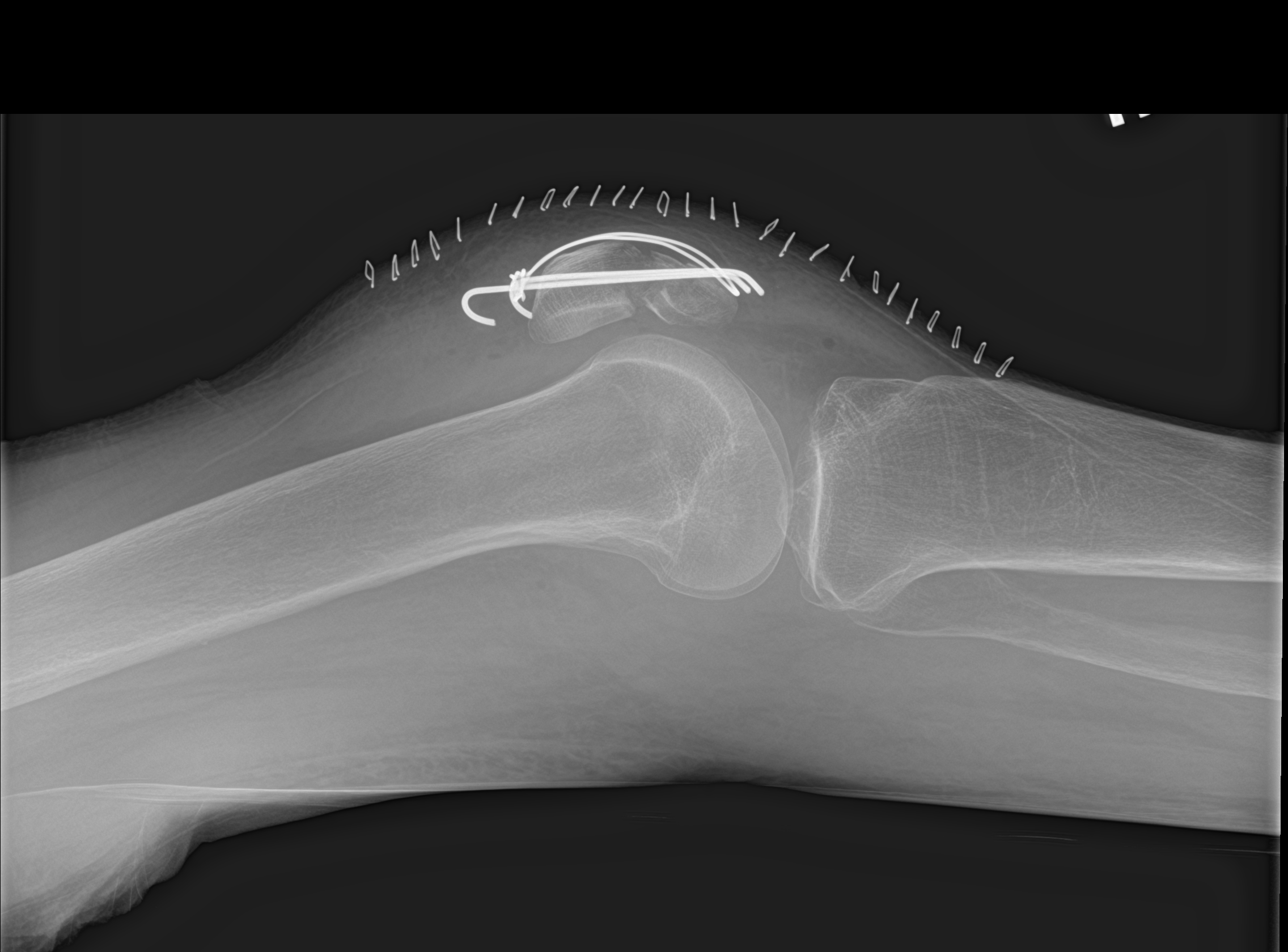

[4 of 4 positions shown; findings below may reference images not displayed]

FINDINGS: Interval cerclage wire and pin fixation of the patella for
comminuted slightly separated mid patella fracture. No significant
bridging callus. Decreased knee effusion. Increased pre and
infrapatellar soft tissue edema.
IMPRESSION: 1. Interval surgical fixation of mid patellar fracture. No
significant bridging callus at this time.
2. Large amount of soft tissue swelling about the knee, most
prominent over the pre and infrapatellar soft tissues. Minimal foci
of supra and infrapatellar soft tissue gas, possibly resolving
operative air.

## 2020-01-26 ENCOUNTER — Ambulatory Visit (INDEPENDENT_AMBULATORY_CARE_PROVIDER_SITE_OTHER): Payer: PPO | Admitting: Family Medicine

## 2020-01-26 ENCOUNTER — Ambulatory Visit: Payer: PPO | Admitting: Gastroenterology

## 2020-01-26 ENCOUNTER — Telehealth: Payer: Self-pay

## 2020-01-26 ENCOUNTER — Other Ambulatory Visit: Payer: Self-pay

## 2020-01-26 ENCOUNTER — Encounter: Payer: Self-pay | Admitting: Family Medicine

## 2020-01-26 VITALS — BP 130/78 | HR 90 | Temp 97.4°F | Resp 16 | Ht 66.0 in | Wt 110.0 lb

## 2020-01-26 DIAGNOSIS — R5383 Other fatigue: Secondary | ICD-10-CM | POA: Diagnosis not present

## 2020-01-26 DIAGNOSIS — G500 Trigeminal neuralgia: Secondary | ICD-10-CM | POA: Diagnosis not present

## 2020-01-26 MED ORDER — OXCARBAZEPINE 150 MG PO TABS: 150.0000 mg | ORAL_TABLET | Freq: Two times a day (BID) | ORAL | 1 refills | Status: DC

## 2020-01-26 NOTE — Telephone Encounter (Signed)
Spoke with the patient via phone complained of trigimenal nerve pain unrelenting. States she has had this before it just is worse now. We did SRS last treatment 01/01/20 advised she call her PCP for treatment. To call back if she needs anything else.

## 2020-01-26 NOTE — Progress Notes (Signed)
Subjective:    Patient ID: [name], female    DOB: May 25, 1941, 79 y.o.   MRN: [id number]  HPI  Unfortunately, patient was recently found to have recurrences of her brain tumor.  She recently started radiation therapy.  She states that since that time she feels "terrible.  She feels weak.  She feels tired.  She has no energy.  She denies any chest pain shortness of breath beyond her baseline however her oxygen saturations are typically around 90% despite taking 2 to 3 L via nasal cannula.  She is also developed severe pain inside her mouth.  She reports pain on the buccal mucosa, on the lower jaw, and on the roof of the mouth.  Although these include the distribution of the trigeminal nerve.  The tongue is spared.  However she denies any tenderness to palpation on the outside of the face.  She denies any tenderness over the jaw or over her cheek or above her eye.  She denies any pain with palpation inside the mouth.  Using a gloved finger, I palpated her palate, her buccal mucosa, hard time, and along her left lower mandible.  I am unable to elicit any pain.  I can see no visible abnormalities.  She just recently went to the dentist and had a normal evaluation. Past Medical History:  Diagnosis Date  . Adenocarcinoma of lung ([place]) 12/2010   left lung/surg only  . Allergic rhinitis   . 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 Anxiety disorder   . Aortic aneurysm ([place])   . Brain metastasis ([place])    lung cancer s/p resection 7/20  . Complication of anesthesia 2011   bloodpressure dropped during colonoscopy, not problems since  . Compression fracture   . COPD (chronic obstructive pulmonary disease) ([place])   . Depression   . Diastolic dysfunction   . Diverticulosis   . Dysphagia   . Emphysema lung ([place]) 11/08/2014  . Headache   . Hiatal hernia   . History of kidney stones   . Hypothyroidism   . IBS (irritable bowel syndrome)   . Iron deficiency anemia due to  chronic blood loss 12/05/2016  . On home O2 12/15/2013   chronic hypoxia  . Pneumonia   . PUD (peptic ulcer disease)    34yrs  . Shortness of breath    with exertion  . SIADH (syndrome of inappropriate ADH production) ([place])   . Trigeminal neuralgia    Past Surgical History:  Procedure Laterality Date  . ABDOMINAL HYSTERECTOMY    . APPLICATION OF CRANIAL NAVIGATION N/A 03/18/2019   Procedure: APPLICATION OF CRANIAL NAVIGATION;  Surgeon: Consuella Lose, MD;  Location: [facility];  Service: Neurosurgery;  Laterality: N/A;  . BACK SURGERY  January 13, 2014  . CATARACT EXTRACTION    . CATARACT EXTRACTION W/PHACO  09/02/2012   Procedure: CATARACT EXTRACTION PHACO AND INTRAOCULAR LENS PLACEMENT (IOC);  Surgeon: Elta Guadeloupe T. Gershon Crane, MD;  [facility];  Service: Ophthalmology;  Laterality: Left;  CDE:10.35  . CHOLECYSTECTOMY    . 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 COLONOSCOPY N/A 11/23/2014   Dr. Oneida Alar: moderate diverticulosis, hemorrhoids, redundant colon. next TCS in 10-15 years.   . CRANIOTOMY Left 03/18/2019   Procedure: Left stereotactic craniotomy for tumor resection;  Surgeon: Consuella Lose, MD;  Location: [facility];  Service: Neurosurgery;  Laterality: Left;  Left stereotactic craniotomy for tumor resection  .  ENDARTERECTOMY Right 08/21/2013   Procedure: RIGHT CAROTID ANEURYSM RESECTION;  Surgeon: Rosetta Posner, MD;  Location: [facility];  Service: Vascular;  Laterality: Right;  . ESOPHAGEAL DILATION  02/24/2018   Procedure: ESOPHAGEAL DILATION;  Surgeon: Danie Binder, MD;  [facility];  Service: Endoscopy;;  . ESOPHAGOGASTRODUODENOSCOPY  11/13/2009   w/dilation to 11mm, gastric ulceration (H.Pylori) s/p treatment  . ESOPHAGOGASTRODUODENOSCOPY  11/21/2009   distal esophageal web, gastritis  . ESOPHAGOGASTRODUODENOSCOPY  03/14/12   TKW:IOXBDZHGD in the distal esophagus/Mild gastritis/small HH. + H.pylori, prescribed Pylera. Finished treatment.    . ESOPHAGOGASTRODUODENOSCOPY N/A 12/13/2017   Dr. Oneida Alar: esophageal web s/p dilation, gastritis, no h.pylori  . ESOPHAGOGASTRODUODENOSCOPY N/A 02/24/2018   Dr. Oneida Alar: web in prox esophagus and in distal esophagus s/p dilation, mild gastritis, mild pyloric stenosis, mild post ulcer duodenal deformity at D1/D2  . fatty tumor removal from lt groin    . FRACTURE SURGERY Left    hand and left ring finger  . GIVENS CAPSULE STUDY N/A 12/26/2017   incomplete  . GIVENS CAPSULE STUDY N/A 02/24/2018   normal small bowel  . KNEE SURGERY     patella tendon repair june 2019 [facility]  . LUNG CANCER SURGERY  12/2010   Left VATS, minithoracotomy, LLL superior segmentectomy  . ORIF PATELLA Right 03/18/2018   Procedure: OPEN REDUCTION INTERNAL (ORIF) FIXATION RIGHT PATELLA;  Surgeon: Carole Civil, MD;  [facility];  Service: Orthopedics;  Laterality: Right;  . ORIF WRIST FRACTURE Left 04/09/2014   Procedure: OPEN REDUCTION INTERNAL FIXATION (ORIF) WRIST FRACTURE;  Surgeon: Renette Butters, MD;  Location: [facility];  Service: Orthopedics;  Laterality: Left;  . PERCUTANEOUS PINNING Left 04/09/2014   Procedure: PERCUTANEOUS PINNING EXTREMITY;  Surgeon: Renette Butters, MD;  Location: [facility];  Service: Orthopedics;  Laterality: Left;  . SAVORY DILATION N/A 12/13/2017   Procedure: SAVORY DILATION;  Surgeon: Danie Binder, MD;  [facility];  Service: Endoscopy;  Laterality: N/A;  . TUBAL LIGATION    . vocal cord surgery  02/06/2011   laryngoscopy with bilateral vocal cord Radiesse injection for vocal cord paralysis  . YAG LASER APPLICATION Left 92/42/6834   Procedure: YAG LASER APPLICATION;  Surgeon: Rutherford Guys, MD;  [facility];  Service: Ophthalmology;  Laterality: Left;   Current Outpatient Medications on File Prior to Visit  Medication Sig Dispense Refill  . albuterol (VENTOLIN HFA) 108 (90 Base) MCG/ACT inhaler Inhale 2 puffs into the lungs every 6 (six) hours as needed for wheezing or  shortness of breath. 18 g 2  . ALPRAZolam (XANAX) 0.5 MG tablet TAKE (1) TABLET BY MOUTH TWICE A DAY AS NEEDED. (Patient taking differently: at bedtime. ) 60 tablet 3  . citalopram (CELEXA) 40 MG tablet TAKE (1/2) TABLET BY MOUTH AT BEDTIME. 45 tablet 5  . denosumab (PROLIA) 60 MG/ML SOSY injection Inject 60 mg into the skin every 6 (six) months. 1 mL 0  . ferrous sulfate 325 (65 FE) MG tablet Take 325 mg by mouth daily with breakfast.    . fluticasone (FLONASE) 50 MCG/ACT nasal spray INSTILL 2 SPRAYS INTO BOTH NOSTRILS DAILY. 16 g 0  . levocetirizine (XYZAL) 5 MG tablet TAKE ONE TABLET BY MOUTH ONCE DAILY. 30 tablet 5  . levothyroxine (SYNTHROID) 50 MCG tablet TAKE 1 TABLET BEFORE BREAKFAST. 90 tablet 0  . linaclotide (LINZESS) 145 MCG CAPS capsule Take 1 capsule (145 mcg total) by mouth daily before breakfast. 30 capsule 5  . Multiple  Vitamin (MULTIVITAMIN WITH MINERALS) TABS tablet Take 1 tablet by mouth daily.    Marland Kitchen OVER THE COUNTER MEDICATION Bone Support 2-4 tabs daily    . pantoprazole (PROTONIX) 40 MG tablet TAKE (1) TABLET BY MOUTH ONCE DAILY BEFORE BREAKFAST. 90 tablet 3  . Probiotic Product (ALIGN PO) Take by mouth daily.    Marland Kitchen tiZANidine (ZANAFLEX) 4 MG tablet TAKE (1) TABLET BY MOUTH EVERY SIX HOURS AS NEEDED FOR MUSCLE SPASMS. 30 tablet 0  . TRELEGY ELLIPTA 100-62.5-25 MCG/INH AEPB INHALE 1 PUFF INTO LUNGS DAILY. 60 each 5   No current facility-administered medications on file prior to visit.   Allergies  Allergen Reactions  . Ciprofloxacin Hcl Hives  . Sulfonamide Derivatives Hives and Rash  . Iohexol Other (See Comments)    UNSPECIFIED REACTION  Desc: Per [facility], pt is allergic to IV contrast, no type of reaction was available. Pt cannot remember but first reacted around 15 yrs ago from an IVP. Notes were date from 2009 at urology center., Onset Date: [id number]   . Prozac [Fluoxetine Hcl] Rash   Social History   Socioeconomic History  . Marital status: Divorced      Spouse name: Not on file  . Number of children: Not on file  . Years of education: Not on file  . Highest education level: Not on file  Occupational History  . Not on file  Tobacco Use  . Smoking status: Former Smoker    Packs/day: 0.50    Years: 20.00    Pack years: 10.00    Types: Cigarettes    Quit date: 12/21/2010    Years since quitting: 9.1  . Smokeless tobacco: Never Used  . Tobacco comment: smoking cessation info given and reviewed   Substance and Sexual Activity  . Alcohol use: Not Currently    Alcohol/week: 0.0 standard drinks    Comment: seldom  . Drug use: No  . Sexual activity: Yes    Birth control/protection: Surgical  Other Topics Concern  . Not on file  Social History Narrative  . Not on file   Social Determinants of Health   Financial Resource Strain:   . Difficulty of Paying Living Expenses:   Food Insecurity:   . Worried About Charity fundraiser in the Last Year:   . Arboriculturist in the Last Year:   Transportation Needs:   . Film/video editor (Medical):   Marland Kitchen Lack of Transportation (Non-Medical):   Physical Activity:   . Days of Exercise per Week:   . Minutes of Exercise per Session:   Stress:   . Feeling of Stress :   Social Connections:   . Frequency of Communication with Friends and Family:   . Frequency of Social Gatherings with Friends and Family:   . Attends Religious Services:   . Active Member of Clubs or Organizations:   . Attends Archivist Meetings:   Marland Kitchen Marital Status:   Intimate Partner Violence:   . Fear of Current or Ex-Partner:   . Emotionally Abused:   Marland Kitchen Physically Abused:   . Sexually Abused:       Current Outpatient Medications on File Prior to Visit  Medication Sig Dispense Refill  . albuterol (VENTOLIN HFA) 108 (90 Base) MCG/ACT inhaler Inhale 2 puffs into the lungs every 6 (six) hours as needed for wheezing or shortness of breath. 18 g 2  . ALPRAZolam (XANAX) 0.5 MG tablet TAKE (1) TABLET BY  MOUTH TWICE A DAY AS  NEEDED. (Patient taking differently: at bedtime. ) 60 tablet 3  . citalopram (CELEXA) 40 MG tablet TAKE (1/2) TABLET BY MOUTH AT BEDTIME. 45 tablet 5  . denosumab (PROLIA) 60 MG/ML SOSY injection Inject 60 mg into the skin every 6 (six) months. 1 mL 0  . ferrous sulfate 325 (65 FE) MG tablet Take 325 mg by mouth daily with breakfast.    . fluticasone (FLONASE) 50 MCG/ACT nasal spray INSTILL 2 SPRAYS INTO BOTH NOSTRILS DAILY. 16 g 0  . levocetirizine (XYZAL) 5 MG tablet TAKE ONE TABLET BY MOUTH ONCE DAILY. 30 tablet 5  . levothyroxine (SYNTHROID) 50 MCG tablet TAKE 1 TABLET BEFORE BREAKFAST. 90 tablet 0  . linaclotide (LINZESS) 145 MCG CAPS capsule Take 1 capsule (145 mcg total) by mouth daily before breakfast. 30 capsule 5  . Multiple Vitamin (MULTIVITAMIN WITH MINERALS) TABS tablet Take 1 tablet by mouth daily.    Marland Kitchen OVER THE COUNTER MEDICATION Bone Support 2-4 tabs daily    . pantoprazole (PROTONIX) 40 MG tablet TAKE (1) TABLET BY MOUTH ONCE DAILY BEFORE BREAKFAST. 90 tablet 3  . Probiotic Product (ALIGN PO) Take by mouth daily.    Marland Kitchen tiZANidine (ZANAFLEX) 4 MG tablet TAKE (1) TABLET BY MOUTH EVERY SIX HOURS AS NEEDED FOR MUSCLE SPASMS. 30 tablet 0  . TRELEGY ELLIPTA 100-62.5-25 MCG/INH AEPB INHALE 1 PUFF INTO LUNGS DAILY. 60 each 5   No current facility-administered medications on file prior to visit.   Allergies  Allergen Reactions  . Ciprofloxacin Hcl Hives  . Sulfonamide Derivatives Hives and Rash  . Iohexol Other (See Comments)    UNSPECIFIED REACTION  Desc: Per [facility], pt is allergic to IV contrast, no type of reaction was available. Pt cannot remember but first reacted around 15 yrs ago from an IVP. Notes were date from 2009 at urology center., Onset Date: [id number]   . Prozac [Fluoxetine Hcl] Rash   Social History   Socioeconomic History  . Marital status: Divorced    Spouse name: Not on file  . Number of children: Not on file  . Years of  education: Not on file  . Highest education level: Not on file  Occupational History  . Not on file  Tobacco Use  . Smoking status: Former Smoker    Packs/day: 0.50    Years: 20.00    Pack years: 10.00    Types: Cigarettes    Quit date: 12/21/2010    Years since quitting: 9.1  . Smokeless tobacco: Never Used  . Tobacco comment: smoking cessation info given and reviewed   Substance and Sexual Activity  . Alcohol use: Not Currently    Alcohol/week: 0.0 standard drinks    Comment: seldom  . Drug use: No  . Sexual activity: Yes    Birth control/protection: Surgical  Other Topics Concern  . Not on file  Social History Narrative  . Not on file   Social Determinants of Health   Financial Resource Strain:   . Difficulty of Paying Living Expenses:   Food Insecurity:   . Worried About Charity fundraiser in the Last Year:   . Arboriculturist in the Last Year:   Transportation Needs:   . Film/video editor (Medical):   Marland Kitchen Lack of Transportation (Non-Medical):   Physical Activity:   . Days of Exercise per Week:   . Minutes of Exercise per Session:   Stress:   . Feeling of Stress :   Social Connections:   .  Frequency of Communication with Friends and Family:   . Frequency of Social Gatherings with Friends and Family:   . Attends Religious Services:   . Active Member of Clubs or Organizations:   . Attends Archivist Meetings:   Marland Kitchen Marital Status:   Intimate Partner Violence:   . Fear of Current or Ex-Partner:   . Emotionally Abused:   Marland Kitchen Physically Abused:   . Sexually Abused:     Review of Systems  All other systems reviewed and are negative.      Objective:   Physical Exam  Constitutional: She appears well-developed and well-nourished. No distress.  HENT:  Mouth/Throat: Uvula is midline, oropharynx is clear and moist and mucous membranes are normal. No oral lesions. No oropharyngeal exudate, posterior oropharyngeal edema or posterior oropharyngeal  erythema.  Neck: No JVD present. No thyromegaly present.  Cardiovascular: Normal rate, regular rhythm and normal heart sounds.  No murmur heard. Pulmonary/Chest: Effort normal. She has no wheezes. She has rhonchi in the right lower field.  Abdominal: Soft. Bowel sounds are normal. She exhibits no distension. There is no abdominal tenderness. There is no rebound and no guarding.  Musculoskeletal:        General: No edema.  Lymphadenopathy:    She has no cervical adenopathy.  Skin: She is not diaphoretic.          Assessment & Plan:  Other fatigue - Plan: CBC with Differential/Platelet, COMPLETE METABOLIC PANEL WITH GFR, TSH, Vitamin B12  Trigeminal neuralgia  I believe the patient's pain is due to trigeminal neuralgia.  She has tried and failed Tegretol and gabapentin in the past.  Therefore I will try Trileptal 150 mg p.o. twice daily and then reassess in 1 week to see if her pain is improving.  Meanwhile given her fatigue I will check a CBC, CMP, TSH, and vitamin B12.  I am concerned that her hyponatremia may be returning as she does have a history of hyponatremia in the past.

## 2020-01-27 ENCOUNTER — Telehealth: Payer: Self-pay | Admitting: Radiation Oncology

## 2020-01-27 LAB — COMPLETE METABOLIC PANEL WITH GFR
AG Ratio: 2 (calc) (ref 1.0–2.5)
ALT: 20 U/L (ref 6–29)
AST: 19 U/L (ref 10–35)
Albumin: 4.3 g/dL (ref 3.6–5.1)
Alkaline phosphatase (APISO): 62 U/L (ref 37–153)
BUN: 21 mg/dL (ref 7–25)
CO2: 31 mmol/L (ref 20–32)
Calcium: 9.7 mg/dL (ref 8.6–10.4)
Chloride: 100 mmol/L (ref 98–110)
Creat: 0.83 mg/dL (ref 0.60–0.93)
GFR, Est African American: 78 mL/min/{1.73_m2} (ref >=60)
GFR, Est Non African American: 68 mL/min/{1.73_m2} (ref >=60)
Globulin: 2.1 g/dL (calc) (ref 1.9–3.7)
Glucose, Bld: 80 mg/dL (ref 65–99)
Potassium: 4.7 mmol/L (ref 3.5–5.3)
Sodium: 139 mmol/L (ref 135–146)
Total Bilirubin: 0.3 mg/dL (ref 0.2–1.2)
Total Protein: 6.4 g/dL (ref 6.1–8.1)

## 2020-01-27 LAB — VITAMIN B12: Vitamin B-12: 689 pg/mL (ref 200–1100)

## 2020-01-27 LAB — CBC WITH DIFFERENTIAL/PLATELET
Absolute Monocytes: 608 cells/uL (ref 200–950)
Basophils Absolute: 64 cells/uL (ref 0–200)
Basophils Relative: 0.8 %
Eosinophils Absolute: 200 cells/uL (ref 15–500)
Eosinophils Relative: 2.5 %
HCT: 37.2 % (ref 35.0–45.0)
Hemoglobin: 12.2 g/dL (ref 11.7–15.5)
Lymphs Abs: 968 cells/uL (ref 850–3900)
MCH: 30.7 pg (ref 27.0–33.0)
MCHC: 32.8 g/dL (ref 32.0–36.0)
MCV: 93.5 fL (ref 80.0–100.0)
MPV: 9.4 fL (ref 7.5–12.5)
Monocytes Relative: 7.6 %
Neutro Abs: 6160 cells/uL (ref 1500–7800)
Neutrophils Relative %: 77 %
Platelets: 355 10*3/uL (ref 140–400)
RBC: 3.98 10*6/uL (ref 3.80–5.10)
RDW: 12.1 % (ref 11.0–15.0)
Total Lymphocyte: 12.1 %
WBC: 8 10*3/uL (ref 3.8–10.8)

## 2020-01-27 LAB — TSH: TSH: 4.09 mIU/L (ref 0.40–4.50)

## 2020-01-27 NOTE — Telephone Encounter (Signed)
Received message from patient requesting a return call. Phoned patient back promptly. Patient's speech garbled. Inquired why. Patient states, "I believe the radiation has irritated my trigeminal nerve." Patient doesn't seem bothered by her garbled speech explaining she has dealt with "this trigeminal nerve for 20 plus years." Patient inquires about next appointment. Explained her one month follow up appointment with Ashlyn is 02/04/2020 at 1330 via phone. Patient verbalized understanding.

## 2020-01-31 DIAGNOSIS — C3490 Malignant neoplasm of unspecified part of unspecified bronchus or lung: Secondary | ICD-10-CM | POA: Diagnosis not present

## 2020-01-31 DIAGNOSIS — J449 Chronic obstructive pulmonary disease, unspecified: Secondary | ICD-10-CM | POA: Diagnosis not present

## 2020-01-31 DIAGNOSIS — J9621 Acute and chronic respiratory failure with hypoxia: Secondary | ICD-10-CM | POA: Diagnosis not present

## 2020-01-31 DIAGNOSIS — R0902 Hypoxemia: Secondary | ICD-10-CM | POA: Diagnosis not present

## 2020-02-01 NOTE — Op Note (Signed)
  Name: [name]  MRN: [id number]  Date: 01/01/2020   DOB: 1941/05/20  Stereotactic Radiosurgery Operative Note  PRE-OPERATIVE DIAGNOSIS:  Multiple Brain Metastases  POST-OPERATIVE DIAGNOSIS:  Multiple Brain Metastases  PROCEDURE:  Stereotactic Radiosurgery  SURGEON:  Jairo Ben, MD  RADIATION ONCOLOGIST: Tyler Pita, MD  NARRATIVE: The patient underwent a radiation treatment planning session in the radiation oncology simulation suite under the care of the radiation oncology physician and physicist.  I participated closely in the radiation treatment planning afterwards. The patient underwent planning CT which was fused to 3T high resolution MRI with 1 mm axial slices.  These images were fused on the planning system.  We contoured the gross target volumes and subsequently expanded this to yield the Planning Target Volume. I actively participated in the planning process.  I helped to define and review the target contours and also the contours of the optic pathway, eyes, brainstem and selected nearby organs at risk.  All the dose constraints for critical structures were reviewed and compared to AAPM Task Group 101.  The prescription dose conformity was reviewed.  I approved the plan electronically.    Accordingly, [name] was brought to the TrueBeam stereotactic radiation treatment linac and placed in the custom immobilization mask.  The patient was aligned according to the IR fiducial markers with BrainLab Exactrac, then orthogonal x-rays were used in ExacTrac with the 6DOF robotic table and the shifts were made to align the patient  [name] received stereotactic radiosurgery uneventfully.    Lesions treated:  2  Right IC lesion to 20Gy Left parafalcine frontal lesion to 20Gy  Complex lesions treated:  0 (>3.5 cm, <22mm of optic path, or within the brainstem)   The detailed description of the procedure is recorded in the radiation oncology procedure note.  I was  present for the duration of the procedure.  DISPOSITION:  Following delivery, the patient was transported to nursing in stable condition and monitored for possible acute effects to be discharged to home in stable condition with follow-up in one month.  Jairo Ben, MD 02/01/2020 10:06 AM

## 2020-02-01 NOTE — Addendum Note (Signed)
Encounter addended by: Consuella Lose, MD on: 02/01/2020 10:08 AM  Actions taken: Clinical Note Signed

## 2020-02-02 ENCOUNTER — Telehealth: Payer: Self-pay | Admitting: Family Medicine

## 2020-02-02 NOTE — Chronic Care Management (AMB) (Signed)
  Chronic Care Management   Note  02/02/2020 Name: [name] MRN: [id number] DOB: May 16, 1941  [name] is a 79 y.o. year old female who is a primary care patient of Pickard, Cammie Mcgee, MD. I reached out to [name] by phone today in response to a referral sent by [name]'s PCP, [name], MD.   [name] was given information about Chronic Care Management services today including:  1. CCM service includes personalized support from designated clinical staff supervised by her physician, including individualized plan of care and coordination with other care providers 2. 24/7 contact phone numbers for assistance for urgent and routine care needs. 3. Service will only be billed when office clinical staff spend 20 minutes or more in a month to coordinate care. 4. Only one practitioner may furnish and bill the service in a calendar month. 5. The patient may stop CCM services at any time (effective at the end of the month) by phone call to the office staff.   Patient agreed to services and verbal consent obtained.   Follow up plan:   [place]

## 2020-02-02 NOTE — Chronic Care Management (AMB) (Deleted)
  Chronic Care Management   Outreach Note  02/02/2020 Name: [name] MRN: [id number] DOB: [birth year]  Referred by: [name], MD Reason for referral : Chronic Care Management (Initial CCM Outreach)   {US CCM Outreach Attempts:[id number]}  Follow Up Plan:   [place]

## 2020-02-02 NOTE — Progress Notes (Signed)
°  Chronic Care Management   Outreach Note  02/02/2020 Name: [name] MRN: [id number] DOB: 03-07-1941  Referred by: [name], MD Reason for referral : Chronic Care Management (Initial CCM Outreach)   An unsuccessful telephone outreach was attempted today. The patient was referred to the pharmacist for assistance with care management and care coordination.   Follow Up Plan:   [place]

## 2020-02-02 NOTE — Progress Notes (Signed)
  Chronic Care Management   Note  02/02/2020 Name: HATSUE SIME MRN: [id number] DOB: 07-19-1941  Deasia ZEANNA SUNDE is a 79 y.o. year old female who is a primary care patient of Pickard, Cammie Mcgee, MD. I reached out to [name] by phone today in response to a referral sent by Ms. Taura S Donaghey's PCP, [name], MD.   Ms. Sanjurjo was given information about Chronic Care Management services today including:  1. CCM service includes personalized support from designated clinical staff supervised by her physician, including individualized plan of care and coordination with other care providers 2. 24/7 contact phone numbers for assistance for urgent and routine care needs. 3. Service will only be billed when office clinical staff spend 20 minutes or more in a month to coordinate care. 4. Only one practitioner may furnish and bill the service in a calendar month. 5. The patient may stop CCM services at any time (effective at the end of the month) by phone call to the office staff.   Patient agreed to services and verbal consent obtained.   Follow up plan:   [place]

## 2020-02-02 NOTE — Telephone Encounter (Signed)
#  CB  [phone] [employer] [facility] hasn't received a referral for her oxygen also should  She get the  covid 19 shot

## 2020-02-03 ENCOUNTER — Telehealth: Payer: Self-pay

## 2020-02-03 ENCOUNTER — Ambulatory Visit: Payer: Self-pay | Admitting: Pharmacist

## 2020-02-03 ENCOUNTER — Telehealth: Payer: Self-pay | Admitting: Pharmacist

## 2020-02-03 NOTE — Progress Notes (Signed)
Radiation Oncology         [phone] ________________________________  Name: [name] MRN: [id number]  Date: 02/04/2020  DOB: 07/29/1941  Post Treatment Note: Conducted via telephone due to current COVID-19 concerns for limiting patient exposure  CC: [name], MD  [name], MD   Diagnosis:   79 y.o. 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  Interval Since Last Radiation: 1 month 01/01/20//SRS:  PTV6 (3 mm inferior right internal capsule lesion) and PTV7 (5 mm left frontal lesion along the falx) were treated to a prescription dose of 20 Gy in a single fraction   03/17/19:  Pre-Op SRS brain in a single fraction to the following targets: PTV1 Post Lt Parietal 53m 18Gy PTV2 Lt Cerebellar 560m 20Gy PTV3 Rt Temp 24m26m0Gy PTV4 Post Lt Insular 5mm70m0 Gy PTV5 Lt Frontal Gyrus 6mm 64mGy  Followed by stereotactic left parietal craniotomy for resection of tumor with Dr. NundkKathyrn Sheriff/10/20.  Narrative:  I spoke with the patient to conduct her routine scheduled 1 month follow up visit via telephone to spare the patient unnecessary potential exposure in the healthcare setting during the current COVID-19 pandemic.  The patient was notified in advance and gave permission to proceed with this visit format.  She tolerated her recent SRS treatment to the lesions in the left internal capsule and right frontal regions without any ill effects.  In summary, she initially presented [facility] the ED [facility]/26/2020 with acute onset aphasia and right-sided weakness. 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  This was treated with surgical resection of the left lower lobe on 12/21/2010, 14 lymph nodes negative, 2.2 cm tumor size, margins negative.  She had been followed by Dr. KatraDelton Coombesbservation since that time with serial chest CT scans  without evidence of disease recurrence or progression.  On admission, the initial head CT (without contrast due to contrast allergy) showed left parietal and occipital parenchymal hematoma measuring up to 3.7 cm, nonspecific, but felt most likely related to hypertension or underlying vasculitis in a patient of this age. Surrounding edema and mass effect mostly local with minimal midline shift of 3-4 mm; no other focal lesions or hemorrhage. A repeat head CT the next day showed the hematoma as stable. She met with Dr. NundkKathyrn Sheriff/27/2020. Following the consultation, she underwent brain MRI (with gadavist contrast) which revealed 5 intraparenchymal metastatic lesions, the largest of which measured 3.7 cm in the posterior left parietal lobe where the associated hemorrhage made accurate measurement difficult. The recommendation was to proceed with pre-op SRS followed by planned resection of the largest lesion in the left parietal lobe under the care and direction of Dr. NundkKathyrn Sheriffe was in agreement with the plan and completed preop SRS in a single fraction to the 5 brain mets on 03/17/2019 followed by surgical resention of the left parietal lesion performed on 03/18/2019.   Follow up MRI brain performed on 06/26/19 showed overall significant improvement with the previously treated lesions in the left cerebellum, inferior right temporal lobe, deep insular region on the left, and left central sulcus region no longer visible. There was only marginal enhancement along the edges of the post resection cavity in the left posterior parietal lobe which appeared fairly thin rather than nodular enhancement and therefore was inconclusive.  These images were reviewed at the multidisciplinary brain tumor boards  on 06/29/19 and consensus recommendation was to just monitor closely on follow up imaging.  She also had repeat systemic imaging with CT C/A/P and a follow up with Dr. Delton Coombes to review those results on 06/09/19.  Fortunately, her systemic imaging continued to show disease stability without evidence of any recurrent, progressive or metastatic disease in the C/A/P. She continued on observation only under the care and direction of Dr. Delton Coombes. A PET scan on 09/14/2019 did not show any evidence of recurrence or metastatic disease and follow up brain MRI on 09/24/19 showed a decreased size of the resection cavity and was without evidence of recurrent tumor, new mass or abnormal enhancement.  Unfortunately, the follow up MRI brain performed on 12/24/19 showed a 3 mm enhancing lesion along the inferior right internal capsule that was punctate on the prior study as well as a 5 mm enhancing left frontal lesion along the falx that was also previously punctate dating back to 06/26/2019.  She also had a recent CT A/P on 12/07/19 for further assessment of abdominal pain and this did not show any pathologically enlarged lymph nodes or other findings to suggest metastatic disease in the abdomen or pelvis. The abdominal pain was felt most likely secondary to constipation considering the volume of stool in the colon. We met back with the patient on 12/25/19 to review the most recent MRI brain scan and discuss the recommendation for treatment of the 2 progressive brain lesions with a single fraction of SRS and she was in agreement.  SRS treatment to the lesions in the inferior right internal capsule and left frontal region was completed on 01/01/20 and tolerated very well.  INTERVAL HISTORY:  She appears to have recovered well from the effects of her recent radiation aside from some persistent right sided visual field impairment, present since the time of her surgery in 03/18/19 and not progressive. Otherwise, she is without complaints.  She did meet with Dr. Mickeal Skinner recently on 01/11/2020 for further evaluation and management of the persistent right-sided visual field deficits and plans to follow up with him following her upcoming  pos-treatment brain MRI.                    On review of systems, she states that she has continued getting along fairly well with improved balance and mobility since completing treatment. She specifically denies headaches, N/V, dizziness, recent changes in auditory or visual acuity, tremors, difficulty with speech or seizure activity. She has completed formal PT in her home but continues with a home exercise routine that they provided and is able to continue living at home independently which she is quite pleased with. 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  She has had bouts with this in the past as well.  She has not had recent productive cough, increased shortness of breath, hemoptysis, fever or chills.  She also reports that the constipation that she was struggling with previously has been manageable with use of Miralax prn.  ALLERGIES:  is allergic to ciprofloxacin hcl; sulfonamide derivatives; iohexol; and prozac [fluoxetine hcl].  Meds: Current Outpatient Medications  Medication Sig Dispense Refill  . albuterol (VENTOLIN HFA) 108 (90 Base) MCG/ACT inhaler Inhale 2 puffs into the lungs every 6 (six) hours as needed for wheezing or shortness of breath. 18 g 2  . ALPRAZolam (XANAX) 0.5 MG tablet TAKE (1) TABLET BY MOUTH TWICE A DAY AS NEEDED. (Patient taking differently: at bedtime. ) 60  tablet 3  . citalopram (CELEXA) 40 MG tablet TAKE (1/2) TABLET BY MOUTH AT BEDTIME. 45 tablet 5  . denosumab (PROLIA) 60 MG/ML SOSY injection Inject 60 mg into the skin every 6 (six) months. 1 mL 0  . ferrous sulfate 325 (65 FE) MG tablet Take 325 mg by mouth daily with breakfast.    . fluticasone (FLONASE) 50 MCG/ACT nasal spray INSTILL 2 SPRAYS INTO BOTH NOSTRILS DAILY. 16 g 0  . levocetirizine (XYZAL) 5 MG tablet TAKE ONE TABLET BY MOUTH ONCE DAILY. 30 tablet 5  . levothyroxine (SYNTHROID) 50 MCG tablet TAKE 1 TABLET BEFORE BREAKFAST. 90 tablet 0  . linaclotide  (LINZESS) 145 MCG CAPS capsule Take 1 capsule (145 mcg total) by mouth daily before breakfast. 30 capsule 5  . Multiple Vitamin (MULTIVITAMIN WITH MINERALS) TABS tablet Take 1 tablet by mouth daily.    Marland Kitchen OVER THE COUNTER MEDICATION Bone Support 2-4 tabs daily    . OXcarbazepine (TRILEPTAL) 150 MG tablet Take 1 tablet (150 mg total) by mouth 2 (two) times daily. 60 tablet 1  . pantoprazole (PROTONIX) 40 MG tablet TAKE (1) TABLET BY MOUTH ONCE DAILY BEFORE BREAKFAST. 90 tablet 3  . Probiotic Product (ALIGN PO) Take by mouth daily.    Marland Kitchen tiZANidine (ZANAFLEX) 4 MG tablet TAKE (1) TABLET BY MOUTH EVERY SIX HOURS AS NEEDED FOR MUSCLE SPASMS. 30 tablet 0  . TRELEGY ELLIPTA 100-62.5-25 MCG/INH AEPB INHALE 1 PUFF INTO LUNGS DAILY. 60 each 5   No current facility-administered medications for this encounter.    Physical Findings:  vitals were not taken for this visit.   /Unable to assess due to telephone visit format.   Lab Findings: Lab Results  Component Value Date   WBC 8.0 01/26/2020   HGB 12.2 01/26/2020   HCT 37.2 01/26/2020   MCV 93.5 01/26/2020   PLT 355 01/26/2020     Radiographic Findings: No results found.  Impression/Plan:  This visit was conducted via telephone to spare the patient unnecessary potential exposure in the healthcare setting during the current COVID-19 pandemic.  26. 79 y.o. 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 She continues to do well clinically and has recovered well from the effects of her recent [facility] and is currently without complaints. We discussed the plan to obtain a follow up brain MRI to assess treatment response in approximately 2 months and pending this scan is stable, we will plan continue with serial MRI brain scans every 3 months for the first 2 years, to closely monitor for any disease recurrence or progression. After 2 years, we will move to 6 month brain MRI scans pending her disease remains stable. She prefers to continue her  follow up, in the office or by telephone, with Dr. Mickeal Skinner following each scan to review results and recommendations from our multidisciplinary brain conference, seeing [place] prn only if further treatment is indicated in the future. She will also remain in routine follow up with Dr. Delton Coombes for continued observation and management of her systemic disease. She is currently on observation only with a planned follow up in 03/2020 with repeat systemic imaging prior to that visit. She knows to call with any questions or concerns in the interim.  Given current concerns for patient exposure during the COVID-19 pandemic, this encounter was conducted via telephone. The patient was notified in advance and was offered a WebEX or [place] video enabled meeting to allow for face to face communication but unfortunately reported that she did not have  the appropriate resources/technology to support such a visit and instead preferred to proceed with a telephone follow up visit. The patient has given verbal consent for this type of encounter. The time spent during this encounter was 35 minutes. The attendants for this meeting include Somer Trotter PA-C, and patient, Jailin Manocchio. During the encounter, Priscilla Finklea PA-C, was located at [facility] Radiation Oncology Department.  Patient, Alexcis Bicking was located at home.    Nicholos Johns, PA-C

## 2020-02-03 NOTE — Chronic Care Management (AMB) (Signed)
   Chronic Care Management Pharmacy  Name: [name]  MRN: [id number] DOB: Oct 07, 1941  Patient called pharmacist to discuss switching her oxygen supplier from [employer] to [employer].  She just did her 5 year re-certification so we will just need to send the information over to the apothecary.  Relayed information to Dr. Samella Parr CMA.  Will f/u with patient to let her know this is taken care of.  Beverly Milch, PharmD Clinical Pharmacist [place] [phone]

## 2020-02-03 NOTE — Telephone Encounter (Signed)
A user error has taken place: charting done on wrong patient and has been corrected.

## 2020-02-03 NOTE — Telephone Encounter (Signed)
Appointment reminder for telephone encounter on 02/04/2020 Patient verbalized she understood this is a telephone encounter

## 2020-02-03 NOTE — Telephone Encounter (Signed)
Our clinical pharmacist spoke to pt and she was wanting to switch her oxygen supplier to [employer].  She said the nearest office for [employer] was in [place] and she felt it best to start getting it more local.  Orders and ov notes faxed to [employer].

## 2020-02-04 ENCOUNTER — Encounter: Payer: Self-pay | Admitting: Urology

## 2020-02-04 ENCOUNTER — Ambulatory Visit
Admission: RE | Admit: 2020-02-04 | Discharge: 2020-02-04 | Disposition: A | Discharge status: Home / Self Care | Payer: PPO | Source: Ambulatory Visit | Attending: Radiation Oncology | Admitting: Radiation Oncology

## 2020-02-04 ENCOUNTER — Other Ambulatory Visit: Payer: Self-pay

## 2020-02-04 DIAGNOSIS — Z510 Encounter for antineoplastic radiation therapy: Secondary | ICD-10-CM | POA: Insufficient documentation

## 2020-02-04 DIAGNOSIS — C3432 Malignant neoplasm of lower lobe, left bronchus or lung: Secondary | ICD-10-CM | POA: Insufficient documentation

## 2020-02-04 DIAGNOSIS — C7931 Secondary malignant neoplasm of brain: Secondary | ICD-10-CM | POA: Insufficient documentation

## 2020-02-05 ENCOUNTER — Other Ambulatory Visit: Payer: Self-pay | Admitting: Family Medicine

## 2020-02-05 DIAGNOSIS — R29898 Other symptoms and signs involving the musculoskeletal system: Secondary | ICD-10-CM

## 2020-02-05 DIAGNOSIS — G500 Trigeminal neuralgia: Secondary | ICD-10-CM

## 2020-02-05 DIAGNOSIS — J449 Chronic obstructive pulmonary disease, unspecified: Secondary | ICD-10-CM

## 2020-02-08 ENCOUNTER — Other Ambulatory Visit: Payer: Self-pay | Admitting: Radiation Therapy

## 2020-02-08 NOTE — Chronic Care Management (AMB) (Signed)
Chronic Care Management Pharmacy  Name: [name]  MRN: [id number] DOB: 1941-07-27  Chief Complaint/ HPI  [name],  79 y.o. , female presents for their Initial CCM visit with the clinical pharmacist via telephone.  PCP : [name], MD  Their chronic conditions include: Hypertension, hyperlipidemia, hypothyroidism COPD, Brain metastasis, osteoporosis  Office Visits:  01/28/2020 ([facility]) - started on trileptal 150mg  twice daily for her pain, concerned about hyponatremia  11/09/19 ([facility]) - UTI started Keflex  09/28/19 ([facility]) - seen for throat irritation, possible d/t steroid component of Trelegy.  Patient to try switching back to Anoro to see if this resolves issue.  Consult Visit:  01/13/2020 [facility]) - visit for constipation, believes align helping but stool still not adequate, started Linzess 145 mcg daily  01/11/2020 ([facility]) - initial visit for reoccurrence of brain metastases, no med changed patient was stable  Medications: Outpatient Encounter Medications as of 02/09/2020  Medication Sig  . albuterol (VENTOLIN HFA) 108 (90 Base) MCG/ACT inhaler Inhale 2 puffs into the lungs every 6 (six) hours as needed for wheezing or shortness of breath.  . ALPRAZolam (XANAX) 0.5 MG tablet TAKE (1) TABLET BY MOUTH TWICE A DAY AS NEEDED. (Patient taking differently: at bedtime. )  . citalopram (CELEXA) 40 MG tablet TAKE (1/2) TABLET BY MOUTH AT BEDTIME.  Marland Kitchen denosumab (PROLIA) 60 MG/ML SOSY injection Inject 60 mg into the skin every 6 (six) months.  . ferrous sulfate 325 (65 FE) MG tablet Take 325 mg by mouth daily with breakfast.  . fluticasone (FLONASE) 50 MCG/ACT nasal spray INSTILL 2 SPRAYS INTO BOTH NOSTRILS DAILY.  Marland Kitchen levocetirizine (XYZAL) 5 MG tablet TAKE ONE TABLET BY MOUTH ONCE DAILY.  Marland Kitchen levothyroxine (SYNTHROID) 50 MCG tablet TAKE 1 TABLET BEFORE BREAKFAST.  . Multiple Vitamin (MULTIVITAMIN WITH MINERALS) TABS tablet Take 1 tablet by mouth daily.  Marland Kitchen OVER THE  COUNTER MEDICATION Bone Support 2-4 tabs daily  . OXcarbazepine (TRILEPTAL) 150 MG tablet Take 1 tablet (150 mg total) by mouth 2 (two) times daily.  . pantoprazole (PROTONIX) 40 MG tablet TAKE (1) TABLET BY MOUTH ONCE DAILY BEFORE BREAKFAST.  Marland Kitchen Probiotic Product (ALIGN PO) Take by mouth daily.  Marland Kitchen tiZANidine (ZANAFLEX) 4 MG tablet TAKE (1) TABLET BY MOUTH EVERY SIX HOURS AS NEEDED FOR MUSCLE SPASMS.  . TRELEGY ELLIPTA 100-62.5-25 MCG/INH AEPB INHALE 1 PUFF INTO LUNGS DAILY.  Marland Kitchen linaclotide (LINZESS) 145 MCG CAPS capsule Take 1 capsule (145 mcg total) by mouth daily before breakfast. (Patient not taking: Reported on 02/09/2020)   No facility-administered encounter medications on file as of 02/09/2020.     Current Diagnosis/Assessment:    Emergency planning/management officer Strain: Low Risk   . Difficulty of Paying Living Expenses: Not hard at all     Goals Addressed            This Visit's Progress   . COPD       CARE PLAN ENTRY  Current Barriers:  . Chronic Disease Management support, education, and care coordination needs related to COPD  Pharmacist Clinical Goal(s):  Marland Kitchen Over the next 90 days patient will work with providers and PharmD towards optimal COPD medication regimen.  Interventions: . Comprehensive medication review performed. . Continue current therapy as it seems to be controlling COPD exacerbations and patient not experiencing any symptoms from the medications.  Patient Self Care Activities:  . Calls provider office for new concerns or questions . Over the next 90 days the patient will report any new  symptoms from medication or increase in shortness of breath episodes to provider or PharmD.  Initial goal documentation     . Hyperlipidemia - LDL < 100       CARE PLAN ENTRY (see longitudinal plan of care for additional care plan information)  Current Barriers:  . Uncontrolled hyperlipidemia, complicated by hypertension, COPD, and trigeminal neuralgia. . Current  antihyperlipidemic regimen: none . Previous antihyperlipidemic medications tried pravastatin, atorvastatin . Most recent lipid panel:     Component Value Date/Time   CHOL 220 (H) 03/08/2019 0426   TRIG 57 03/08/2019 0426   HDL 54 03/08/2019 0426   CHOLHDL 4.1 03/08/2019 0426   VLDL 11 03/08/2019 0426   LDLCALC 155 (H) 03/08/2019 0426   . ASCVD risk enhancing conditions: age >105, DM, HTN, CKD, CHF, current smoker . 10-year ASCVD risk score: 22.3%  Pharmacist Clinical Goal(s):  Marland Kitchen Over the next 90 days, patient will work with PharmD and providers towards optimized antihyperlipidemic therapy  Interventions: . Comprehensive medication review performed; medication list updated in electronic medical record.  Bertram Savin care team collaboration (see longitudinal plan of care) . Discussed statin medication will evaluate after lipid panel is current.  Patient Self Care Activities:  . Patient will focus on continuing to follow her healthy eating habits. . Over the next 90 days patient will get updated lipid panel so we can evaluate need of medication at that time.  Initial goal documentation     . Hypertension - BP < 140/90       CARE PLAN ENTRY (see longitudinal plan of care for additional care plan information)  Current Barriers:  . Uncontrolled hypertension, complicated by hyperlipidemia, COPD, and trigeminal neuralgia. . Current antihypertensive regimen: none . Previous antihypertensives tried: none . Last practice recorded BP readings:  BP Readings from Last 3 Encounters:  01/26/20 130/78  01/13/20 101/67  01/11/20 115/84   . Current home BP readings: not checking regularly   Pharmacist Clinical Goal(s):  Marland Kitchen Over the next 90 days, patient will work with PharmD and providers to maintain control of blood pressure. Interventions: . Inter-disciplinary care team collaboration (see longitudinal plan of care) . Comprehensive medication review performed; medication list  updated in the electronic medical record.  . Continue to monitor when she is feeling "off."  Patient Self Care Activities:  . Over the next 90 days, patient will continue to check BP as she feels symptomatic , document, and provide at future appointments  Initial goal documentation     . Trigeminal Neuralgia       CARE PLAN ENTRY  Current Barriers:  . Chronic Disease Management support, education, and care coordination needs related to  trigeminal neuralgia  Pharmacist Clinical Goal(s):  Marland Kitchen Over the next 90 days, patient will work with PharmD and providers to optimize medications related trigeminal neuralgia and manage symptoms to a tolerable level for the patient.  Interventions: . Comprehensive medication review performed. . Continue current medications.  Patient Self Care Activities:  . Over the next 90 days patient will report any change in symptoms to PharmD or provider.   . Patient will continue to focus on medication adherence by using pill box.  Initial goal documentation        COPD    Gold Grade: Gold 3 (FEV1 30-49%) Current COPD Classification:  A (low sx, <2 exacerbations/yr)  Eosinophil count:   Lab Results  Component Value Date/Time   EOSPCT 2.5 01/26/2020 04:23 PM  %  Eos (Absolute):  Lab Results  Component Value Date/Time   EOSABS 200 01/26/2020 04:23 PM    Tobacco Status:  Social History   Tobacco Use  Smoking Status Former Smoker  . Packs/day: 0.50  . Years: 20.00  . Pack years: 10.00  . Types: Cigarettes  . Quit date: 12/21/2010  . Years since quitting: 9.1  Smokeless Tobacco Never Used  Tobacco Comment   smoking cessation info given and reviewed     Patient has failed these meds in past: Anoro ellipta Patient is currently controlled on the following medications: albuterol 74mcg inhaler, Trelegy Ellipta one puff daily, Oxygen 2L nasal canula Using maintenance inhaler regularly? Yes Frequency of rescue  inhaler use:  usually once daily  We discussed: patient uses rescue inhaler usually once daily and feels her COPD is well controlled with the Trelegy.  She reports no more throat irritation once she started rinsing mouth after each use.  O2 saturation averages (94%) per patient when using oxygen.  Plan  Continue current medications.  Report any changes in symptoms to PharmD or provider. ,  Hypertension    Office blood pressures are  BP Readings from Last 3 Encounters:  01/26/20 130/78  01/13/20 101/67  01/11/20 115/84    Patient has failed these meds in the past: none noted  Patient checks BP at home when feeling symptomatic.  Has to check very rarely as BP has been controlled for a while.  Patient home BP readings are ranging: not checking  Patient is currently controlled on the following medications: none noted   We discussed : patient has cuff at home and only checks when needed.  She has no history of BP elevation.  Plan  Continue current treatment plan.    Hyperlipidemia   Lipid Panel     Component Value Date/Time   CHOL 220 (H) 03/08/2019 0426   TRIG 57 03/08/2019 0426   HDL 54 03/08/2019 0426   CHOLHDL 4.1 03/08/2019 0426   VLDL 11 03/08/2019 0426   LDLCALC 155 (H) 03/08/2019 0426     The 10-year ASCVD risk score Mikey Bussing DC Jr., et al., 2013) is: 22.3%   Values used to calculate the score:     Age: 30 years     Sex: Female     Is Non-Hispanic African American: No     Diabetic: No     Tobacco smoker: No     Systolic Blood Pressure: 425 mmHg     Is BP treated: No     HDL Cholesterol: 54 mg/dL     Total Cholesterol: 220 mg/dL   Patient has failed these meds in past: atorvastatin, pravastatin Patient is currently uncontrolled on the following medications: none  We discussed: she eats a healthy diet, cannot recall every being on statin medication.  Last lipid panel in May 2020 showed increased TC and LDL.  Discussed statin with patient and feels that with  everything else going on she will hold off for now.  Due for lipid recheck at next PCP visit.    Plan  Continue control with diet and exercise   Brain Metastasis - Trigeminal Neuralgia    Patient has failed these meds in past: gabapentin, Tegretol Patient is currently uncontrolled on the following medications: oxcarbazepine 150mg  bid  We discussed:  She reports her symptoms have improved since starting oxcarbazepine.  Still has some shooting pain every now and then, but much more tolerable than before.    Plan  Continue current medications.  Patient  to report increased symptoms to PharmD or PCP.  Hypothyroidism   TSH  Date Value Ref Range Status  01/26/2020 4.09 0.40 - 4.50 mIU/L Final     Patient has failed these meds in past: none Patient is currently controlled on the following medications: levothyroxine 20mcg daily   Plan  Continue current medications and control with diet and exercise  Osteoporosis    Patient has failed these meds in past: alendronate Patient is currently controlled on the following medications: Prolia every 6 months  We discussed:  patient chose to get Prolia injection over COVID-19 vaccine last time.  Due to her staying at home most of the time she felt that her bone density was more of a risk.    Plan  Continue current medications. Patient due for bone density scan. She is calling to set up appointment.  Vaccines   Reviewed and discussed patient's vaccination history.    Immunization History  Administered Date(s) Administered  . Fluad Quad(high Dose 65+) 06/19/2019  . Hepatitis B 02/17/2007, 04/17/2007, 09/12/2007  . Influenza, High Dose Seasonal PF 07/11/2018  . Influenza,inj,Quad PF,6+ Mos 07/12/2015, 07/12/2016, 06/11/2017  . Pneumococcal Conjugate-13 11/27/2019  . Pneumococcal Polysaccharide-23 07/19/2014  . Td 02/17/2007  . Tdap 04/09/2014  . Zoster 01/12/2009    Plan  Recommended patient receive COVID-19 vaccine.  She is  still unsure with everything going on with her but will consider.  Medication Management   . Miscellaneous medications: alprazolam bid prn, flonase, levocetirizine, Linzess, pantoprazole 40mg , citalopram 40mg  tablet, tizanidine 4mg  tablets every 6 hours . OTC's:  Align probiotic, MVI, ferrous sulfate 325mg , Mucinex daily for mucus in chest . Patient currently uses [facility] in [place].  Phone #  [phone] . Patient reports using pill box method to organize medications and promote adherence. . Patient denies missed doses of medication. . No cost issues for medications per patient.  Beverly Milch, PharmD Clinical Pharmacist [facility] [phone]

## 2020-02-09 ENCOUNTER — Ambulatory Visit: Payer: PPO | Admitting: Pharmacist

## 2020-02-09 ENCOUNTER — Other Ambulatory Visit: Payer: Self-pay

## 2020-02-09 DIAGNOSIS — I10 Essential (primary) hypertension: Secondary | ICD-10-CM

## 2020-02-09 DIAGNOSIS — J449 Chronic obstructive pulmonary disease, unspecified: Secondary | ICD-10-CM

## 2020-02-09 DIAGNOSIS — E785 Hyperlipidemia, unspecified: Secondary | ICD-10-CM

## 2020-02-09 DIAGNOSIS — G500 Trigeminal neuralgia: Secondary | ICD-10-CM

## 2020-02-09 NOTE — Patient Instructions (Addendum)
Thank you for meeting with me today!  I look forward to working with you to help you meet all of your healthcare goals and answer any questions you may have.  Feel free to contact me anytime!    Visit Information  Goals Addressed            This Visit's Progress   . COPD       CARE PLAN ENTRY  Current Barriers:  . Chronic Disease Management support, education, and care coordination needs related to COPD  Pharmacist Clinical Goal(s):  Marland Kitchen Over the next 90 days patient will work with providers and PharmD towards optimal COPD medication regimen.  Interventions: . Comprehensive medication review performed. . Continue current therapy as it seems to be controlling COPD exacerbations and patient not experiencing any symptoms from the medications.  Patient Self Care Activities:  . Calls provider office for new concerns or questions . Over the next 90 days the patient will report any new symptoms from medication or increase in shortness of breath episodes to provider or PharmD.  Initial goal documentation     . Hyperlipidemia - LDL < 100       CARE PLAN ENTRY (see longitudinal plan of care for additional care plan information)  Current Barriers:  . Uncontrolled hyperlipidemia, complicated by hypertension, COPD, and trigeminal neuralgia. . Current antihyperlipidemic regimen: none . Previous antihyperlipidemic medications tried pravastatin, atorvastatin . Most recent lipid panel:     Component Value Date/Time   CHOL 220 (H) 03/08/2019 0426   TRIG 57 03/08/2019 0426   HDL 54 03/08/2019 0426   CHOLHDL 4.1 03/08/2019 0426   VLDL 11 03/08/2019 0426   LDLCALC 155 (H) 03/08/2019 0426   . ASCVD risk enhancing conditions: age >67, DM, HTN, CKD, CHF, current smoker . 10-year ASCVD risk score: 22.3%  Pharmacist Clinical Goal(s):  Marland Kitchen Over the next 90 days, patient will work with PharmD and providers towards optimized antihyperlipidemic therapy  Interventions: . Comprehensive  medication review performed; medication list updated in electronic medical record.  Bertram Savin care team collaboration (see longitudinal plan of care) . Discussed statin medication will evaluate after lipid panel is current.  Patient Self Care Activities:  . Patient will focus on continuing to follow her healthy eating habits. . Over the next 90 days patient will get updated lipid panel so we can evaluate need of medication at that time.  Initial goal documentation     . Hypertension - BP < 140/90       CARE PLAN ENTRY (see longitudinal plan of care for additional care plan information)  Current Barriers:  . Uncontrolled hypertension, complicated by hyperlipidemia, COPD, and trigeminal neuralgia. . Current antihypertensive regimen: none . Previous antihypertensives tried: none . Last practice recorded BP readings:  BP Readings from Last 3 Encounters:  01/26/20 130/78  01/13/20 101/67  01/11/20 115/84   . Current home BP readings: not checking regularly   Pharmacist Clinical Goal(s):  Marland Kitchen Over the next 90 days, patient will work with PharmD and providers to maintain control of blood pressure. Interventions: . Inter-disciplinary care team collaboration (see longitudinal plan of care) . Comprehensive medication review performed; medication list updated in the electronic medical record.  . Continue to monitor when she is feeling "off."  Patient Self Care Activities:  . Over the next 90 days, patient will continue to check BP as she feels symptomatic , document, and provide at future appointments  Initial goal documentation     . Trigeminal Neuralgia  CARE PLAN ENTRY  Current Barriers:  . Chronic Disease Management support, education, and care coordination needs related to  trigeminal neuralgia  Pharmacist Clinical Goal(s):  Marland Kitchen Over the next 90 days, patient will work with PharmD and providers to optimize medications related trigeminal neuralgia and manage  symptoms to a tolerable level for the patient.  Interventions: . Comprehensive medication review performed. . Continue current medications.  Patient Self Care Activities:  . Over the next 90 days patient will report any change in symptoms to PharmD or provider.   . Patient will continue to focus on medication adherence by using pill box.  Initial goal documentation        Ms. Gatliff was given information about Chronic Care Management services today including:  1. CCM service includes personalized support from designated clinical staff supervised by her physician, including individualized plan of care and coordination with other care providers 2. 24/7 contact phone numbers for assistance for urgent and routine care needs. 3. Standard insurance, coinsurance, copays and deductibles apply for chronic care management only during months in which we provide at least 20 minutes of these services. Most insurances cover these services at 100%, however patients may be responsible for any copay, coinsurance and/or deductible if applicable. This service may help you avoid the need for more expensive face-to-face services. 4. Only one practitioner may furnish and bill the service in a calendar month. 5. The patient may stop CCM services at any time (effective at the end of the month) by phone call to the office staff.  Patient agreed to services and verbal consent obtained.   The patient verbalized understanding of instructions provided today and agreed to receive a mailed copy of patient instruction and/or educational materials. Telephone follow up appointment with pharmacy team member scheduled for: 05/11/2020 @ 8:30 AM  Beverly Milch, PharmD Clinical Pharmacist [facility] [phone]   Trigeminal Neuralgia  Trigeminal neuralgia is a nerve disorder that causes severe pain on one side of the face. The pain may last from a few seconds to several minutes. The pain is usually  only on one side of the face. Symptoms may occur for days, weeks, or months and then go away for months or years. The pain may return and be worse than before. What are the causes? This condition is caused by damage or pressure to a nerve in the head that is called the trigeminal nerve. An attack can be triggered by:  Talking.  Chewing.  Putting on makeup.  Washing your face.  Shaving your face.  Brushing your teeth.  Touching your face. What increases the risk? You are more likely to develop this condition if you:  Are 56 years of age or older.  Are female. What are the signs or symptoms? The main symptom of this condition is severe pain in the:  Jaw.  Lips.  Eyes.  Nose.  Scalp.  Forehead.  Face. The pain may be:  Intense.  Stabbing.  Electric.  Shock-like. How is this diagnosed? This condition is diagnosed with a physical exam. A CT scan or an MRI may be done to rule out other conditions that can cause facial pain. How is this treated? This condition may be treated with:  Avoiding the things that trigger your symptoms.  Taking prescription medicines (anticonvulsants).  Having surgery. This may be done in severe cases if other medical treatment does not provide relief.  Having procedures such as ablation, thermal, or radiation therapy. It may take up  to one month for treatment to start relieving the pain. Follow these instructions at home: Managing pain  Learn as much as you can about how to manage your pain. Ask your health care provider if a pain specialist would be helpful.  Consider talking with a mental health care provider (psychologist) about how to cope with the pain.  Consider joining a pain support group. General instructions  Take over-the-counter and prescription medicines only as told by your health care provider.  Avoid the things that trigger your symptoms. It may help to: ? Chew on the unaffected side of your mouth. ? Avoid  touching your face. ? Avoid blasts of hot or cold air.  Follow your treatment plan as told by your health care provider. This may include: ? Cognitive or behavioral therapy. ? Gentle, regular exercise. ? Meditation or yoga. ? Aromatherapy.  Keep all follow-up visits as told by your health care provider. You may need to be monitored closely to make sure treatment is working well for you. Where to find more information  Facial Pain Association: fpa-support.org Contact a health care provider if:  Your medicine is not helping your symptoms.  You have side effects from the medicine used for treatment.  You develop new, unexplained symptoms, such as: ? Double vision. ? Facial weakness. ? Facial numbness. ? Changes in hearing or balance.  You feel depressed. Get help right away if:  Your pain is severe and is not getting better.  You develop suicidal thoughts. If you ever feel like you may hurt yourself or others, or have thoughts about taking your own life, get help right away. You can go to your nearest emergency department or call:  Your local emergency services (911 in the [place]  A suicide crisis helpline, such as the [place] at 780-353-0407. This is open 24 hours a day. Summary  Trigeminal neuralgia is a nerve disorder that causes severe pain on one side of the face. The pain may last from a few seconds to several minutes.  This condition is caused by damage or pressure to a nerve in the head that is called the trigeminal nerve.  Treatment may include avoiding the things that trigger your symptoms, taking medicines, or having surgery or procedures. It may take up to one month for treatment to start relieving the pain.  Avoid the things that trigger your symptoms.  Keep all follow-up visits as told by your health care provider. You may need to be monitored closely to make sure treatment is working well for you. This information is not  intended to replace advice given to you by your health care provider. Make sure you discuss any questions you have with your health care provider. Document Revised: 08/11/2018 Document Reviewed: 08/11/2018 Elsevier Patient Education  [place].

## 2020-02-09 NOTE — Progress Notes (Signed)
I have collaborated with the care management provider regarding care management and care coordination activities outlined in this encounter and have reviewed this encounter including documentation in the note and care plan. I am certifying that I agree with the content of this note and encounter as supervising physician.  

## 2020-02-26 ENCOUNTER — Encounter: Payer: Self-pay | Admitting: Family Medicine

## 2020-02-26 ENCOUNTER — Other Ambulatory Visit: Payer: Self-pay | Admitting: Family Medicine

## 2020-02-29 ENCOUNTER — Other Ambulatory Visit: Payer: Self-pay | Admitting: Family Medicine

## 2020-03-01 DIAGNOSIS — J9621 Acute and chronic respiratory failure with hypoxia: Secondary | ICD-10-CM | POA: Diagnosis not present

## 2020-03-01 DIAGNOSIS — C3490 Malignant neoplasm of unspecified part of unspecified bronchus or lung: Secondary | ICD-10-CM | POA: Diagnosis not present

## 2020-03-01 DIAGNOSIS — J449 Chronic obstructive pulmonary disease, unspecified: Secondary | ICD-10-CM | POA: Diagnosis not present

## 2020-03-01 DIAGNOSIS — R0902 Hypoxemia: Secondary | ICD-10-CM | POA: Diagnosis not present

## 2020-03-03 ENCOUNTER — Other Ambulatory Visit (HOSPITAL_COMMUNITY): Payer: Self-pay | Admitting: Surgery

## 2020-03-03 ENCOUNTER — Telehealth (HOSPITAL_COMMUNITY): Payer: Self-pay | Admitting: Surgery

## 2020-03-03 ENCOUNTER — Encounter (HOSPITAL_COMMUNITY): Payer: Self-pay

## 2020-03-03 MED ORDER — PREDNISONE 50 MG PO TABS: ORAL_TABLET | ORAL | 0 refills | Status: AC

## 2020-03-03 MED ORDER — DIPHENHYDRAMINE HCL 50 MG PO TABS
50.0000 mg | ORAL_TABLET | ORAL | 0 refills | Status: DC
Start: 2020-03-10 — End: 2020-04-18

## 2020-03-03 NOTE — Telephone Encounter (Signed)
Pt had left a voicemail asking if she could get pre-meds before her CT on June 3.  Prednisone 50 mg and Benadryl 50 were called in to her pharmacy with the special instructions of how to take before the CT.  Pt verbalized understanding, and she also asked if her appointment with Dr. Delton Coombes on June 8 could be changed to a phone visit since her mobility has decreased.  Per Dr. Delton Coombes, the appointment does not need to be changed to a phone visit until he sees the CT scan results.  Pt verbalized understanding of this and was told to call back if she had any more questions or concerns.

## 2020-03-08 ENCOUNTER — Telehealth: Payer: Self-pay | Admitting: Family Medicine

## 2020-03-08 IMAGING — CT CT CHEST W/O CM
2 of 4 series · 15 of 36 positions shown, 18 images · non-contrast
Comparison: Chest CT 05/09/2017

CLINICAL DATA: Follow-up of lung cancer, post resection of the left
lower lobe.

EXAM:
CT CHEST WITHOUT CONTRAST
TECHNIQUE: Multidetector CT imaging of the chest was performed following the
standard protocol without IV contrast.

[Series 2: thorax · axial · 0.68mm/px · z∈[+1248,+1484]mm · 12 of 138 slices shown, 15 images]
[im 10/138  mediastinal]
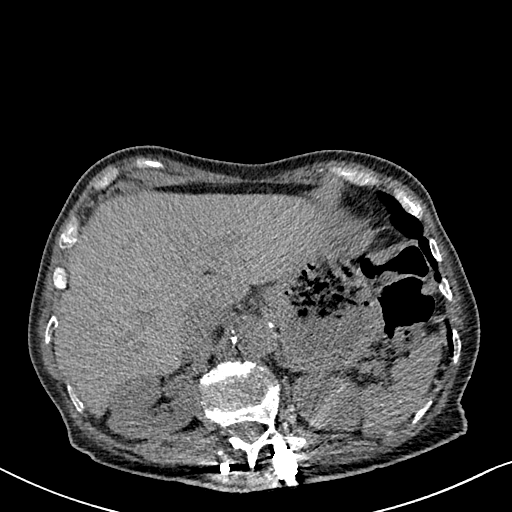
[im 10/138  lung]
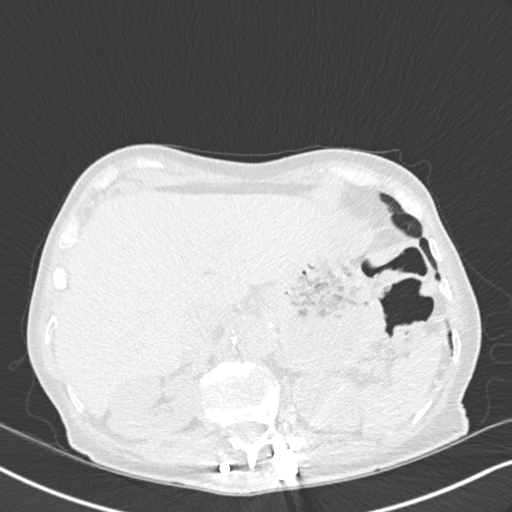
[im 20/138  lung]
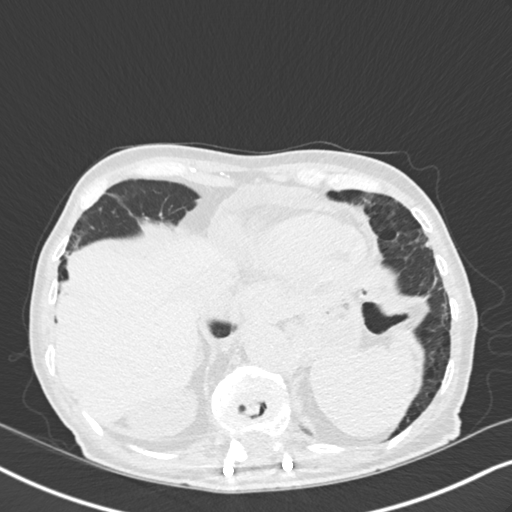
[im 30/138  lung]
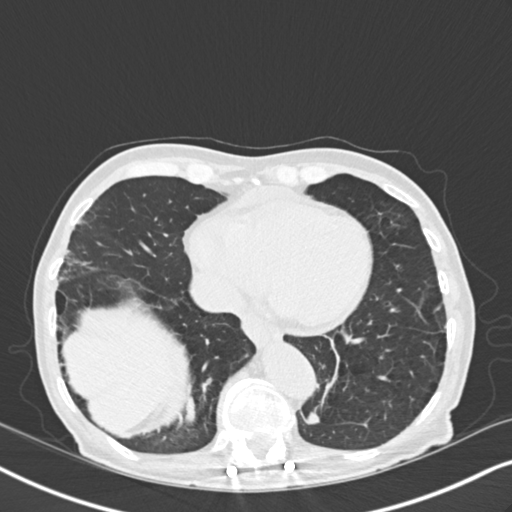
[im 40/138  lung]
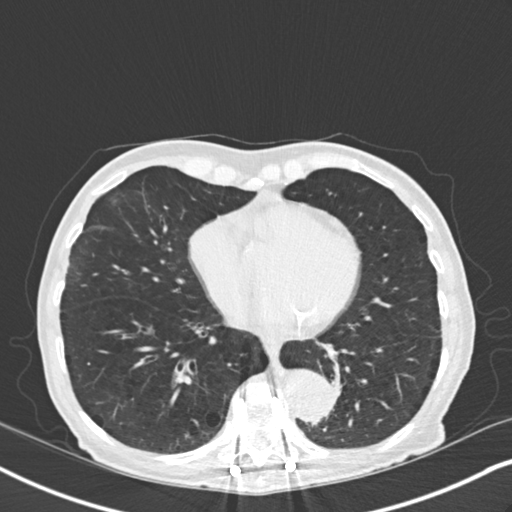
[im 49/138  mediastinal]
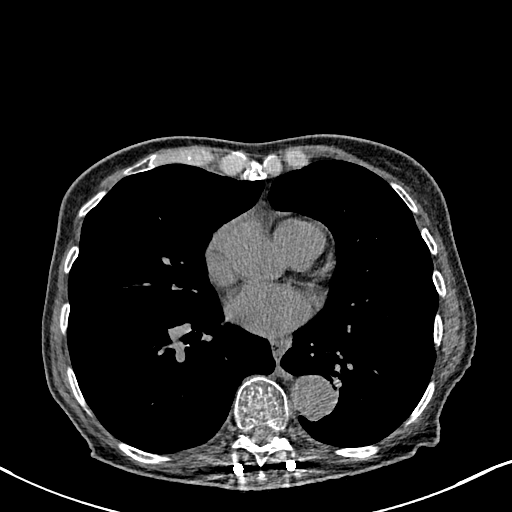
[im 49/138  lung]
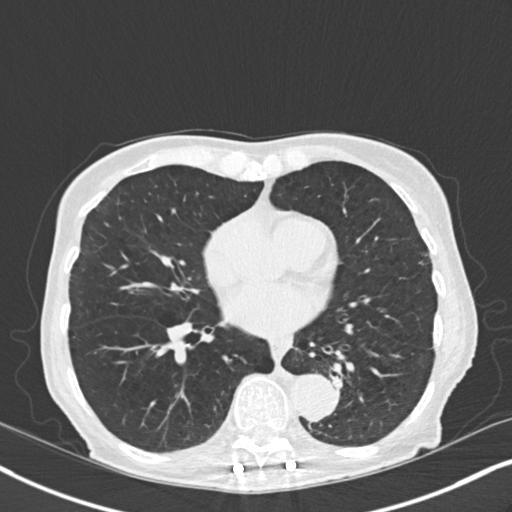
[im 59/138  lung]
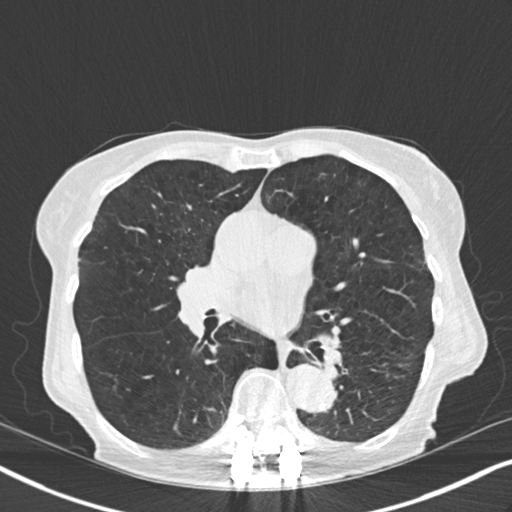
[im 79/138  lung]
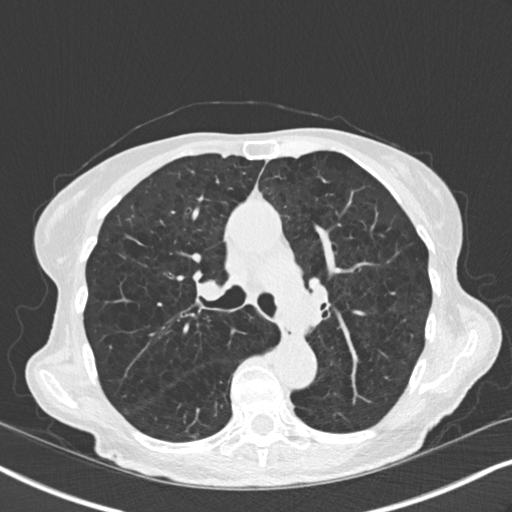
[im 89/138  lung]
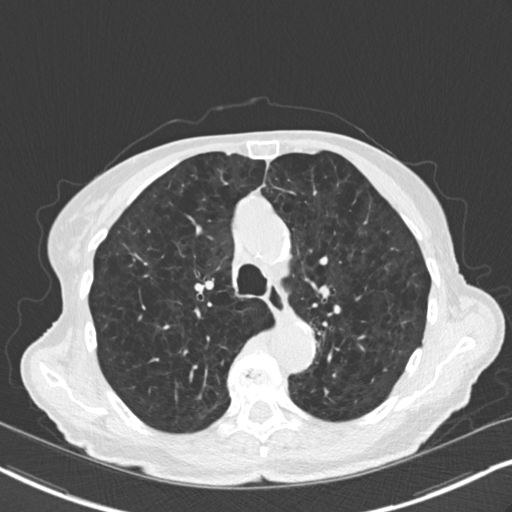
[im 98/138  mediastinal]
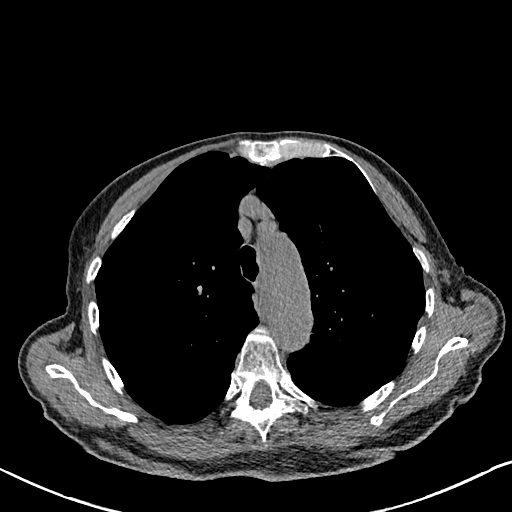
[im 98/138  lung]
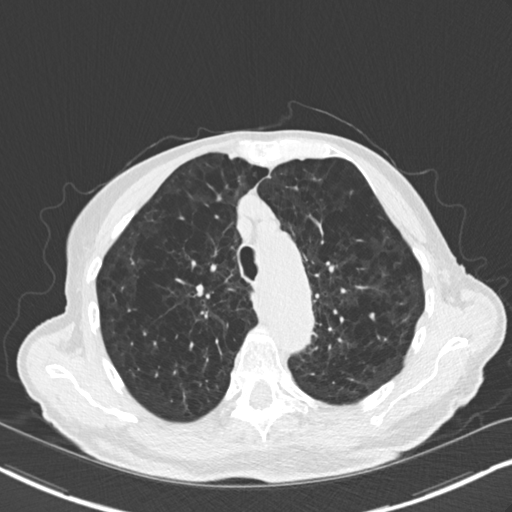
[im 108/138  lung]
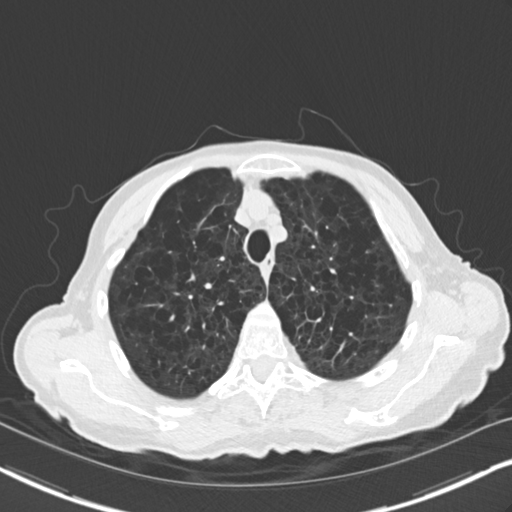
[im 118/138  lung]
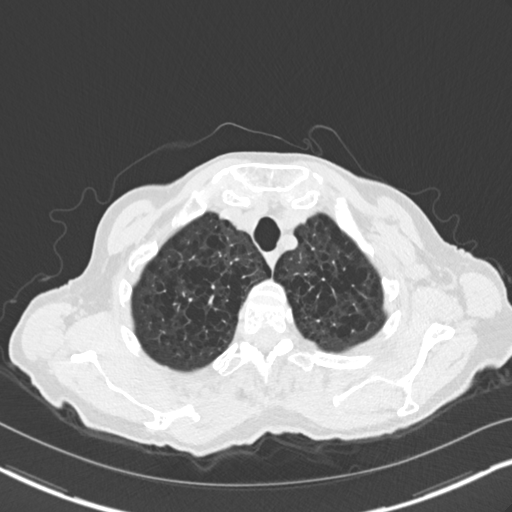
[im 128/138  lung]
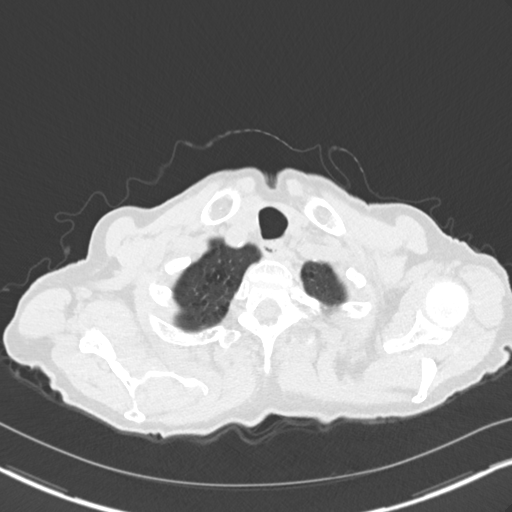

[Series 6: coronal · coronal · 0.56mm/px · 3 of 123 slices shown]
[im 25/123  lung]
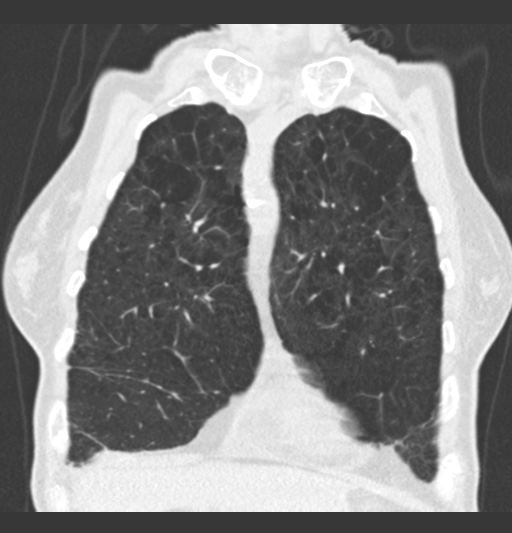
[im 49/123  lung]
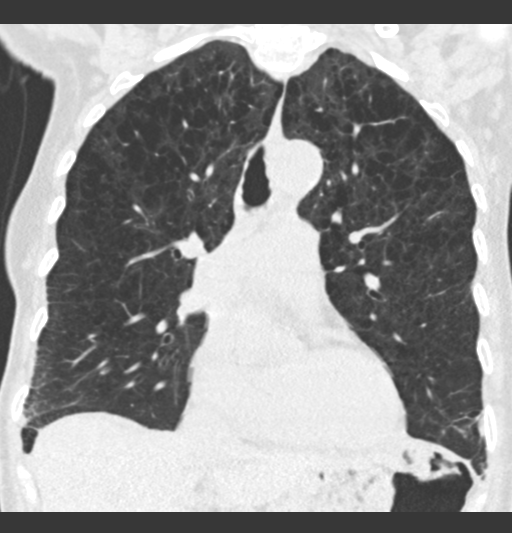
[im 74/123  lung]
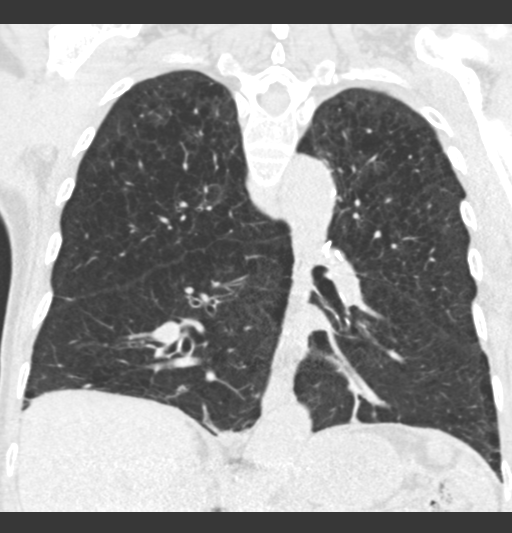

[15 of 36 positions shown; findings below may reference images not displayed]

FINDINGS: Cardiovascular: Persistent small pericardial effusion versus
thickening of the anterior pericardium. Small amount of free fluid
in the anterior mediastinum/superior pericardial recess. Normal
heart size. Calcific atherosclerotic disease of the coronary
arteries and aorta, and the mitral valve annulus.

Mediastinum/Nodes: No enlarged mediastinal or axillary lymph nodes.
Thyroid gland, trachea, and esophagus demonstrate no significant
findings. Small hiatal hernia.

Lungs/Pleura: Moderate to severe upper lobe predominant
emphysematous changes and chronic cylindrical bronchiectasis. Stable
postoperative changes of wedge resection in the left lower lobe.
Previously noted subpleural superior segment of right lower lobe
nodule is stable at 4 mm, image 59/128, series 4. Persistent linear
scarring in the right middle lobe, lingula and left lower lobe.

Upper Abdomen: 5 mm nonobstructive calculus in the interpolar region
of the right kidney. Atherosclerotic disease of the abdominal aorta.

Musculoskeletal: Partially visualized thoraco lumbar spine fusion,
with chronic appearing compression fractures of T6, T8, T12 and L1
vertebral bodies.
IMPRESSION: Stable postsurgical finding from left lower lobe wedge resection. No
finding to suggest local recurrence or metastatic disease.

Stable 4 mm ground-glass nodule in the posterior aspect of the
superior segment of the right lower lobe. Attention on future
follow-up.

Moderate to advanced COPD.

Slight increase in pericardial effusion versus pericardial
thickening. Small amount of free fluid in the anterior
mediastinum/superior pericardial recess.

Stable compression fractures of T6, T8, T12 and L1 vertebral bodies.

5 mm nonobstructive calculus in the right kidney.

Aortic Atherosclerosis (H6MKS-9WG.G) and Emphysema (H6MKS-OP5.E).

## 2020-03-08 NOTE — Telephone Encounter (Signed)
CB# [phone]

## 2020-03-08 NOTE — Telephone Encounter (Signed)
CB# [phone] Pt would like to drop off urnie sample Wed if its okay she has appt in [place] that Wed

## 2020-03-08 NOTE — Telephone Encounter (Signed)
[phone]

## 2020-03-08 NOTE — Telephone Encounter (Signed)
[name] is going to bring her urine sample on Thursday 03/10/20

## 2020-03-09 ENCOUNTER — Other Ambulatory Visit (HOSPITAL_COMMUNITY): Payer: Self-pay | Admitting: *Deleted

## 2020-03-09 DIAGNOSIS — C7931 Secondary malignant neoplasm of brain: Secondary | ICD-10-CM

## 2020-03-09 DIAGNOSIS — C3492 Malignant neoplasm of unspecified part of left bronchus or lung: Secondary | ICD-10-CM

## 2020-03-09 NOTE — Telephone Encounter (Signed)
error 

## 2020-03-10 ENCOUNTER — Ambulatory Visit (HOSPITAL_COMMUNITY)
Admission: RE | Admit: 2020-03-10 | Discharge: 2020-03-10 | Disposition: A | Discharge status: Home / Self Care | Payer: PPO | Source: Ambulatory Visit | Attending: Hematology | Admitting: Hematology

## 2020-03-10 ENCOUNTER — Encounter: Payer: Self-pay | Admitting: Family Medicine

## 2020-03-10 ENCOUNTER — Inpatient Hospital Stay (HOSPITAL_COMMUNITY): Payer: PPO | Attending: Hematology

## 2020-03-10 ENCOUNTER — Other Ambulatory Visit: Payer: Self-pay

## 2020-03-10 DIAGNOSIS — C3492 Malignant neoplasm of unspecified part of left bronchus or lung: Secondary | ICD-10-CM

## 2020-03-10 DIAGNOSIS — E611 Iron deficiency: Secondary | ICD-10-CM | POA: Diagnosis not present

## 2020-03-10 DIAGNOSIS — Z85038 Personal history of other malignant neoplasm of large intestine: Secondary | ICD-10-CM | POA: Insufficient documentation

## 2020-03-10 DIAGNOSIS — M810 Age-related osteoporosis without current pathological fracture: Secondary | ICD-10-CM | POA: Diagnosis not present

## 2020-03-10 DIAGNOSIS — C7931 Secondary malignant neoplasm of brain: Secondary | ICD-10-CM

## 2020-03-10 LAB — CBC WITH DIFFERENTIAL/PLATELET
Abs Immature Granulocytes: 0.03 10*3/uL (ref 0.00–0.07)
Basophils Absolute: 0 10*3/uL (ref 0.0–0.1)
Basophils Relative: 0 %
Eosinophils Absolute: 0 10*3/uL (ref 0.0–0.5)
Eosinophils Relative: 0 %
HCT: 37.4 % (ref 36.0–46.0)
Hemoglobin: 12.5 g/dL (ref 12.0–15.0)
Immature Granulocytes: 0 %
Lymphocytes Relative: 6 %
Lymphs Abs: 0.5 10*3/uL — ABNORMAL LOW (ref 0.7–4.0)
MCH: 30.5 pg (ref 26.0–34.0)
MCHC: 33.4 g/dL (ref 30.0–36.0)
MCV: 91.2 fL (ref 80.0–100.0)
Monocytes Absolute: 0.2 10*3/uL (ref 0.1–1.0)
Monocytes Relative: 2 %
Neutro Abs: 8.4 10*3/uL — ABNORMAL HIGH (ref 1.7–7.7)
Neutrophils Relative %: 92 %
Platelets: 332 10*3/uL (ref 150–400)
RBC: 4.1 MIL/uL (ref 3.87–5.11)
RDW: 11.9 % (ref 11.5–15.5)
WBC: 9.1 10*3/uL (ref 4.0–10.5)
nRBC: 0 % (ref 0.0–0.2)

## 2020-03-10 LAB — COMPREHENSIVE METABOLIC PANEL
ALT: 28 U/L (ref 0–44)
AST: 27 U/L (ref 15–41)
Albumin: 4.4 g/dL (ref 3.5–5.0)
Alkaline Phosphatase: 55 U/L (ref 38–126)
Anion gap: 11 (ref 5–15)
BUN: 17 mg/dL (ref 8–23)
CO2: 25 mmol/L (ref 22–32)
Calcium: 9.5 mg/dL (ref 8.9–10.3)
Chloride: 91 mmol/L — ABNORMAL LOW (ref 98–111)
Creatinine, Ser: 0.4 mg/dL — ABNORMAL LOW (ref 0.44–1.00)
GFR calc Af Amer: >60 mL/min (ref >60)
GFR calc non Af Amer: >60 mL/min (ref >60)
Glucose, Bld: 149 mg/dL — ABNORMAL HIGH (ref 70–99)
Potassium: 4.4 mmol/L (ref 3.5–5.1)
Sodium: 127 mmol/L — ABNORMAL LOW (ref 135–145)
Total Bilirubin: 0.5 mg/dL (ref 0.3–1.2)
Total Protein: 7 g/dL (ref 6.5–8.1)

## 2020-03-10 MED ORDER — IOHEXOL 300 MG/ML  SOLN
75.0000 mL | Freq: Once | INTRAMUSCULAR | Status: AC | PRN
  Administered 2020-03-10: 75 mL via INTRAVENOUS

## 2020-03-11 ENCOUNTER — Other Ambulatory Visit: Payer: Self-pay | Admitting: Family Medicine

## 2020-03-11 ENCOUNTER — Encounter (HOSPITAL_COMMUNITY): Payer: Self-pay

## 2020-03-11 DIAGNOSIS — E870 Hyperosmolality and hypernatremia: Secondary | ICD-10-CM

## 2020-03-14 ENCOUNTER — Other Ambulatory Visit (HOSPITAL_COMMUNITY): Payer: Self-pay | Admitting: *Deleted

## 2020-03-14 DIAGNOSIS — C3492 Malignant neoplasm of unspecified part of left bronchus or lung: Secondary | ICD-10-CM

## 2020-03-15 ENCOUNTER — Other Ambulatory Visit: Payer: Self-pay

## 2020-03-15 ENCOUNTER — Inpatient Hospital Stay (HOSPITAL_COMMUNITY): Payer: PPO | Admitting: Hematology

## 2020-03-15 ENCOUNTER — Inpatient Hospital Stay (HOSPITAL_COMMUNITY): Payer: PPO

## 2020-03-15 VITALS — BP 124/76 | HR 73 | Temp 96.8°F | Resp 20 | Wt 111.0 lb

## 2020-03-15 DIAGNOSIS — C3492 Malignant neoplasm of unspecified part of left bronchus or lung: Secondary | ICD-10-CM | POA: Diagnosis not present

## 2020-03-15 DIAGNOSIS — Z85038 Personal history of other malignant neoplasm of large intestine: Secondary | ICD-10-CM | POA: Diagnosis not present

## 2020-03-15 LAB — COMPREHENSIVE METABOLIC PANEL
ALT: 29 U/L (ref 0–44)
AST: 21 U/L (ref 15–41)
Albumin: 4.1 g/dL (ref 3.5–5.0)
Alkaline Phosphatase: 53 U/L (ref 38–126)
Anion gap: 11 (ref 5–15)
BUN: 21 mg/dL (ref 8–23)
CO2: 28 mmol/L (ref 22–32)
Calcium: 9.7 mg/dL (ref 8.9–10.3)
Chloride: 96 mmol/L — ABNORMAL LOW (ref 98–111)
Creatinine, Ser: 0.42 mg/dL — ABNORMAL LOW (ref 0.44–1.00)
GFR calc Af Amer: >60 mL/min (ref >60)
GFR calc non Af Amer: >60 mL/min (ref >60)
Glucose, Bld: 94 mg/dL (ref 70–99)
Potassium: 4.5 mmol/L (ref 3.5–5.1)
Sodium: 135 mmol/L (ref 135–145)
Total Bilirubin: 0.3 mg/dL (ref 0.3–1.2)
Total Protein: 6.6 g/dL (ref 6.5–8.1)

## 2020-03-15 LAB — CBC WITH DIFFERENTIAL/PLATELET
Abs Immature Granulocytes: 0.03 10*3/uL (ref 0.00–0.07)
Basophils Absolute: 0 10*3/uL (ref 0.0–0.1)
Basophils Relative: 1 %
Eosinophils Absolute: 0.2 10*3/uL (ref 0.0–0.5)
Eosinophils Relative: 3 %
HCT: 36.3 % (ref 36.0–46.0)
Hemoglobin: 11.7 g/dL — ABNORMAL LOW (ref 12.0–15.0)
Immature Granulocytes: 0 %
Lymphocytes Relative: 15 %
Lymphs Abs: 1.1 10*3/uL (ref 0.7–4.0)
MCH: 30.1 pg (ref 26.0–34.0)
MCHC: 32.2 g/dL (ref 30.0–36.0)
MCV: 93.3 fL (ref 80.0–100.0)
Monocytes Absolute: 0.6 10*3/uL (ref 0.1–1.0)
Monocytes Relative: 8 %
Neutro Abs: 5.3 10*3/uL (ref 1.7–7.7)
Neutrophils Relative %: 73 %
Platelets: 322 10*3/uL (ref 150–400)
RBC: 3.89 MIL/uL (ref 3.87–5.11)
RDW: 12.5 % (ref 11.5–15.5)
WBC: 7.2 10*3/uL (ref 4.0–10.5)
nRBC: 0 % (ref 0.0–0.2)

## 2020-03-15 LAB — TSH: TSH: 2.827 u[IU]/mL (ref 0.350–4.500)

## 2020-03-15 NOTE — Patient Instructions (Addendum)
[place] at [facility] Discharge Instructions  You were seen today by Dr. Delton Coombes. He went over your recent results. You will be scheduled for a CT scan. Dr. Delton Coombes will see you back 3 months after you CT scan for labs and follow up.   Thank you for choosing [place] at [facility] to provide your oncology and hematology care.  To afford each patient quality time with our provider, please arrive at least 15 minutes before your scheduled appointment time.   If you have a lab appointment with the [facility] please come in thru the Main Entrance and check in at the main information desk  You need to re-schedule your appointment should you arrive 10 or more minutes late.  We strive to give you quality time with our providers, and arriving late affects you and other patients whose appointments are after yours.  Also, if you no show three or more times for appointments you may be dismissed from the clinic at the providers discretion.     Again, thank you for choosing [facility].  Our hope is that these requests will decrease the amount of time that you wait before being seen by our physicians.       _____________________________________________________________  Should you have questions after your visit to [facility], please contact our office at [phone] between the hours of 8:00 a.m. and 4:30 p.m.  Voicemails left after 4:00 p.m. will not be returned until the following business day.  For prescription refill requests, have your pharmacy contact our office and allow 72 hours.    Cancer Center Support Programs:   > Cancer Support Group  2nd Tuesday of the month 1pm-2pm, Journey Room

## 2020-03-15 NOTE — Progress Notes (Signed)
[place] [place], [place] [address]   CLINIC:  Medical Oncology/Hematology  PCP:  [name], MD [address] [place] [address] [phone]   REASON FOR VISIT:  Follow-up for metastatic left lung cancer and iron deficiency state.  CURRENT THERAPY: Observation AND intermittent iron infusions   BRIEF ONCOLOGIC HISTORY:  Oncology History  Adenocarcinoma of lung ([place])  12/21/2010 Initial Diagnosis   S/P left lower lobe resection for a well-differentiated Stage IB bronchoalveolar cancer. 0/14 lymph nodes.  No perineural invasion or LVI.  negative margins.   06/05/2016 Imaging   Although the previously described ground-glass attenuation nodule in the superior segment of the right lower lobe measures slightly larger on today's examination than prior studies, this is favored to be technique related. Today's study should serve as a baseline for future followup examinations. At this time, there is no central solid component to this lesion. No new nodules are noted. 2. Stable postoperative scarring in the left lower lobe and post infectious scarring throughout the visualize lung bases, as above. 3. Areas of cylindrical bronchiectasis throughout the lung bases bilaterally    05/09/2017 Imaging   IMPRESSION: 1. Status post left lower lobe wedge resection. No findings to suggest local recurrence of disease or definite metastatic disease in the thorax. Previously noted ground-glass attenuation nodule in the superior segment of the right lower lobe appears slightly smaller than the prior study, suggesting a benign etiology. 2. Diffuse bronchial wall thickening with moderate to severe centrilobular and paraseptal emphysema; imaging findings suggestive of underlying COPD. 3. Aortic atherosclerosis, in addition to left main and left circumflex coronary artery disease. Assessment for potential risk factor modification, dietary therapy or  pharmacologic therapy may be warranted, if clinically indicated. 4. Increasing anterior pericardial fluid and/or thickening. This is unlikely to be of hemodynamic significance at this time. No associated pericardial calcification. 5. New (but non acute) compression fracture of T6 with approximately 90% loss of anterior vertebral body height, in addition to multiple other chronic compression fractures of the visualize the thoracolumbar spine.     CANCER STAGING: Cancer Staging Adenocarcinoma of lung [facility]) Staging form: Lung, AJCC 7th Edition - Clinical: Stage IB - Signed by Baird Cancer, PA on 08/01/2012   INTERVAL HISTORY:  Ms. [name], a 79 y.o. female, returns for routine follow-up of her metastatic left lung cancer and iron deficiency state. Reeva was last seen on 09/16/2019.  She reports that she hasn't felt good since her radiation in March 2021. She is not stable on her feet since her back surgery in 2015 and ambulates with a walker. She lives by herself. She tolerated the Prolia well after the previous administration. Her appetite has improved since drinking Boost.   REVIEW OF SYSTEMS:  Review of Systems  Constitutional: Positive for appetite change (moderately decreased) and fatigue (severe).  HENT:   Positive for trouble swallowing (w/ solids).   Respiratory: Positive for shortness of breath.   Cardiovascular: Negative for leg swelling.  Gastrointestinal: Positive for abdominal pain.  Musculoskeletal: Positive for back pain (5/10).  All other systems reviewed and are negative.   PAST MEDICAL/SURGICAL HISTORY:  Past Medical History:  Diagnosis Date  . Adenocarcinoma of lung ([place]) 12/2010   left lung/surg only  . Allergic rhinitis   . 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 Anxiety disorder   . Aortic aneurysm ([place])   . Brain metastasis (  [place])    lung cancer s/p resection 7/20  . Complication of anesthesia 2011    bloodpressure dropped during colonoscopy, not problems since  . Compression fracture   . COPD (chronic obstructive pulmonary disease) ([place])   . Depression   . Diastolic dysfunction   . Diverticulosis   . Dysphagia   . Emphysema lung ([place]) 11/08/2014  . Headache   . Hiatal hernia   . History of kidney stones   . Hypothyroidism   . IBS (irritable bowel syndrome)   . Iron deficiency anemia due to chronic blood loss 12/05/2016  . On home O2 12/15/2013   chronic hypoxia  . Pneumonia   . PUD (peptic ulcer disease)    29yr  . Shortness of breath    with exertion  . SIADH (syndrome of inappropriate ADH production) ([place]   . Trigeminal neuralgia    Past Surgical History:  Procedure Laterality Date  . ABDOMINAL HYSTERECTOMY    . APPLICATION OF CRANIAL NAVIGATION N/A 03/18/2019   Procedure: APPLICATION OF CRANIAL NAVIGATION;  Surgeon: NConsuella Lose MD;  [facility]  Service: Neurosurgery;  Laterality: N/A;  . BACK SURGERY  January 13, 2014  . CATARACT EXTRACTION    . CATARACT EXTRACTION W/PHACO  09/02/2012   Procedure: CATARACT EXTRACTION PHACO AND INTRAOCULAR LENS PLACEMENT (IOC);  Surgeon: MElta GuadeloupeT. SGershon Crane MD;  Location: [facility];  Service: Ophthalmology;  Laterality: Left;  CDE:10.35  . CHOLECYSTECTOMY    . 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 COLONOSCOPY N/A 11/23/2014   Dr. FOneida Alar moderate diverticulosis, hemorrhoids, redundant colon. next TCS in 10-15 years.   . CRANIOTOMY Left 03/18/2019   Procedure: Left stereotactic craniotomy for tumor resection;  Surgeon: NConsuella Lose MD;  [facility]: [facility]  Service: Neurosurgery;  Laterality: Left;  Left stereotactic craniotomy for tumor resection  . ENDARTERECTOMY Right 08/21/2013   Procedure: RIGHT CAROTID ANEURYSM RESECTION;  Surgeon: TRosetta Posner MD;  [facility]  Service: Vascular;  Laterality: Right;  . ESOPHAGEAL DILATION  02/24/2018   Procedure: ESOPHAGEAL DILATION;  Surgeon:  FDanie Binder MD;  [facility];  Service: Endoscopy;;  . ESOPHAGOGASTRODUODENOSCOPY  11/13/2009   w/dilation to 186m gastric ulceration (H.Pylori) s/p treatment  . ESOPHAGOGASTRODUODENOSCOPY  11/21/2009   distal esophageal web, gastritis  . ESOPHAGOGASTRODUODENOSCOPY  03/14/12   SLUEA:[id number] the distal esophagus/Mild gastritis/small HH. + H.pylori, prescribed Pylera. Finished treatment.   . ESOPHAGOGASTRODUODENOSCOPY N/A 12/13/2017   Dr. FiOneida Alaresophageal web s/p dilation, gastritis, no h.pylori  . ESOPHAGOGASTRODUODENOSCOPY N/A 02/24/2018   Dr. FiOneida Alarweb in prox esophagus and in distal esophagus s/p dilation, mild gastritis, mild pyloric stenosis, mild post ulcer duodenal deformity at D1/D2  . fatty tumor removal from lt groin    . FRACTURE SURGERY Left    hand and left ring finger  . GIVENS CAPSULE STUDY N/A 12/26/2017   incomplete  . GIVENS CAPSULE STUDY N/A 02/24/2018   normal small bowel  . KNEE SURGERY     patella tendon repair june 2019 [facility]  . LUNG CANCER SURGERY  12/2010   Left VATS, minithoracotomy, LLL superior segmentectomy  . ORIF PATELLA Right 03/18/2018   Procedure: OPEN REDUCTION INTERNAL (ORIF) FIXATION RIGHT PATELLA;  Surgeon: HaCarole CivilMD;  Location: [facility];  Service: Orthopedics;  Laterality: Right;  . ORIF WRIST FRACTURE Left 04/09/2014   Procedure: OPEN REDUCTION INTERNAL FIXATION (ORIF) WRIST FRACTURE;  Surgeon: TiRenette ButtersMD;  [facility]  Service: Orthopedics;  Laterality: Left;  . PERCUTANEOUS PINNING Left 04/09/2014   Procedure: PERCUTANEOUS PINNING EXTREMITY;  Surgeon: Renette Butters, MD;  Location: [facility];  Service: Orthopedics;  Laterality: Left;  . SAVORY DILATION N/A 12/13/2017   Procedure: SAVORY DILATION;  Surgeon: Danie Binder, MD;  [facility];  Service: Endoscopy;  Laterality: N/A;  . TUBAL LIGATION    . vocal cord surgery  02/06/2011   laryngoscopy with bilateral vocal cord Radiesse injection for  vocal cord paralysis  . YAG LASER APPLICATION Left 39/76/7341   Procedure: YAG LASER APPLICATION;  Surgeon: Rutherford Guys, MD;  Location: [facility];  Service: Ophthalmology;  Laterality: Left;    SOCIAL HISTORY:  Social History   Socioeconomic History  . Marital status: Divorced    Spouse name: Not on file  . Number of children: Not on file  . Years of education: Not on file  . Highest education level: Not on file  Occupational History  . Not on file  Tobacco Use  . Smoking status: Former Smoker    Packs/day: 0.50    Years: 20.00    Pack years: 10.00    Types: Cigarettes    Quit date: 12/21/2010    Years since quitting: 9.2  . Smokeless tobacco: Never Used  . Tobacco comment: smoking cessation info given and reviewed   Substance and Sexual Activity  . Alcohol use: Not Currently    Alcohol/week: 0.0 standard drinks    Comment: seldom  . Drug use: No  . Sexual activity: Yes    Birth control/protection: Surgical  Other Topics Concern  . Not on file  Social History Narrative  . Not on file   Social Determinants of Health   Financial Resource Strain: Low Risk   . Difficulty of Paying Living Expenses: Not hard at all  Food Insecurity:   . Worried About Charity fundraiser in the Last Year:   . Arboriculturist in the Last Year:   Transportation Needs:   . Film/video editor (Medical):   Marland Kitchen Lack of Transportation (Non-Medical):   Physical Activity:   . Days of Exercise per Week:   . Minutes of Exercise per Session:   Stress:   . Feeling of Stress :   Social Connections:   . Frequency of Communication with Friends and Family:   . Frequency of Social Gatherings with Friends and Family:   . Attends Religious Services:   . Active Member of Clubs or Organizations:   . Attends Archivist Meetings:   Marland Kitchen Marital Status:   Intimate Partner Violence:   . Fear of Current or Ex-Partner:   . Emotionally Abused:   Marland Kitchen Physically Abused:   . Sexually Abused:      FAMILY HISTORY:  Family History  Problem Relation Age of Onset  . Pulmonary embolism Mother   . Heart attack Father   . Hypertension Father   . Liver cancer Sister   . Cancer Sister   . Cancer Brother   . Cancer Daughter   . Colon cancer Neg Hx     CURRENT MEDICATIONS:  Current Outpatient Medications  Medication Sig Dispense Refill  . albuterol (VENTOLIN HFA) 108 (90 Base) MCG/ACT inhaler Inhale 2 puffs into the lungs every 6 (six) hours as needed for wheezing or shortness of breath. 18 g 2  . ALPRAZolam (XANAX) 0.5 MG tablet TAKE (1) TABLET BY MOUTH TWICE A DAY AS NEEDED. (Patient taking differently: at bedtime. )  60 tablet 3  . citalopram (CELEXA) 40 MG tablet TAKE (1/2) TABLET BY MOUTH AT BEDTIME. 45 tablet 5  . denosumab (PROLIA) 60 MG/ML SOSY injection Inject 60 mg into the skin every 6 (six) months. 1 mL 0  . diphenhydrAMINE (BENADRYL) 50 MG tablet Take 1 tablet (50 mg total) by mouth as directed for 1 dose. Take Benadryl 50 mg 1 hour before your CT scan. 1 tablet 0  . ferrous sulfate 325 (65 FE) MG tablet Take 325 mg by mouth daily with breakfast.    . fluticasone (FLONASE) 50 MCG/ACT nasal spray INSTILL 2 SPRAYS INTO BOTH NOSTRILS DAILY. 16 g 0  . levocetirizine (XYZAL) 5 MG tablet TAKE ONE TABLET BY MOUTH ONCE DAILY. 30 tablet 5  . levothyroxine (SYNTHROID) 50 MCG tablet TAKE 1 TABLET BEFORE BREAKFAST. 90 tablet 0  . linaclotide (LINZESS) 145 MCG CAPS capsule Take 1 capsule (145 mcg total) by mouth daily before breakfast. (Patient not taking: Reported on 02/09/2020) 30 capsule 5  . Multiple Vitamin (MULTIVITAMIN WITH MINERALS) TABS tablet Take 1 tablet by mouth daily.    Marland Kitchen OVER THE COUNTER MEDICATION Bone Support 2-4 tabs daily    . OXcarbazepine (TRILEPTAL) 150 MG tablet TAKE (1) TABLET BY MOUTH TWICE DAILY. 60 tablet 0  . pantoprazole (PROTONIX) 40 MG tablet TAKE (1) TABLET BY MOUTH ONCE DAILY BEFORE BREAKFAST. 90 tablet 3  . Probiotic Product (ALIGN PO) Take by mouth  daily.    Marland Kitchen tiZANidine (ZANAFLEX) 4 MG tablet TAKE (1) TABLET BY MOUTH EVERY SIX HOURS AS NEEDED FOR MUSCLE SPASMS. 30 tablet 0  . TRELEGY ELLIPTA 100-62.5-25 MCG/INH AEPB INHALE 1 PUFF INTO LUNGS DAILY. 60 each 5   No current facility-administered medications for this visit.    ALLERGIES:  Allergies  Allergen Reactions  . Ciprofloxacin Hcl Hives  . Sulfonamide Derivatives Hives and Rash  . Iohexol Other (See Comments)    UNSPECIFIED REACTION  Desc: Per [facility], pt is allergic to IV contrast, no type of reaction was available. Pt cannot remember but first reacted around 15 yrs ago from an IVP. Notes were date from 2009 at urology center., Onset Date: [id number]   . Prozac [Fluoxetine Hcl] Rash    PHYSICAL EXAM:  Performance status (ECOG): 1 - Symptomatic but completely ambulatory  There were no vitals filed for this visit. Wt Readings from Last 3 Encounters:  01/26/20 110 lb (49.9 kg)  01/13/20 109 lb 6.4 oz (49.6 kg)  01/11/20 99 lb 12.8 oz (45.3 kg)   Physical Exam Vitals reviewed.  Constitutional:      Appearance: Normal appearance.  Cardiovascular:     Rate and Rhythm: Normal rate and regular rhythm.     Pulses: Normal pulses.     Heart sounds: Normal heart sounds.  Pulmonary:     Effort: Pulmonary effort is normal.     Breath sounds: Normal breath sounds.  Abdominal:     Palpations: Abdomen is soft.     Tenderness: There is no abdominal tenderness.  Musculoskeletal:     Thoracic back: Bony tenderness present.     Right lower leg: No edema.     Left lower leg: No edema.  Neurological:     General: No focal deficit present.     Mental Status: She is alert and oriented to person, place, and time.  Psychiatric:        Mood and Affect: Mood normal.        Behavior: Behavior normal.  LABORATORY DATA:  I have reviewed the labs as listed.  CBC Latest Ref Rng & Units 03/15/2020 03/10/2020 01/26/2020  WBC 4.0 - 10.5 K/uL 7.2 9.1 8.0  Hemoglobin 12.0 - 15.0  g/dL 11.7(L) 12.5 12.2  Hematocrit 36.0 - 46.0 % 36.3 37.4 37.2  Platelets 150 - 400 K/uL 322 332 355   CMP Latest Ref Rng & Units 03/15/2020 03/10/2020 01/26/2020  Glucose 70 - 99 mg/dL 94 149(H) 80  BUN 8 - 23 mg/dL _0 Creatinine 0.44 - 1.00 mg/dL 0.42(L) 0.40(L) 0.83  Sodium 135 - 145 mmol/L 135 127(L) 139  Potassium 3.5 - 5.1 mmol/L 4.5 4.4 4.7  Chloride 98 - 111 mmol/L 96(L) 91(L) 100  CO2 22 - 32 mmol/L _1 Calcium 8.9 - 10.3 mg/dL 9.7 9.5 9.7  Total Protein 6.5 - 8.1 g/dL 6.6 7.0 6.4  Total Bilirubin 0.3 - 1.2 mg/dL 0.3 0.5 0.3  Alkaline Phos 38 - 126 U/L 53 55 -  AST 15 - 41 U/L _2 ALT 0 - 44 U/L _3 DIAGNOSTIC IMAGING:  I have independently reviewed the scans and discussed with the patient. CT Chest W Contrast  Result Date: 03/11/2020 CLINICAL DATA:  Left lung adenocarcinoma. Status post surgery and radiation therapy. EXAM: CT CHEST WITH CONTRAST TECHNIQUE: Multidetector CT imaging of the chest was performed during intravenous contrast administration. CONTRAST:  47m OMNIPAQUE IOHEXOL 300 MG/ML  SOLN COMPARISON:  05/26/2019 FINDINGS: Cardiovascular: The heart size is normal. No substantial pericardial effusion. Coronary artery calcification is evident. Atherosclerotic calcification is noted in the wall of the thoracic aorta. Mediastinum/Nodes: No mediastinal lymphadenopathy. There is no hilar lymphadenopathy. The esophagus has normal imaging features. There is no axillary lymphadenopathy. Lungs/Pleura: Centrilobular and paraseptal emphysema evident. New atelectasis or scarring noted in the lateral right upper lobe (28/4). Collapse/consolidative changes in the dependent lower lobes has decreased in the interval, but remains stable in the lingula 7 mm left infrahilar nodule on 88/4 was 4 mm previously and measured about 5 mm on the intervening abdomen/pelvis CT of 12/07/2019. 4 mm medial left lower lobe nodule on 93/4 was about 2 mm previously. Small airway  impaction noted in the lung bases bilaterally. No pleural effusion. Upper Abdomen: Unremarkable. Musculoskeletal: Thoracolumbar fusion hardware. Bones are diffusely demineralized multilevel thoracolumbar compression fractures again noted with new superior endplate compression deformity at T7 and progression of collapse at T9. Superior endplate compression deformity at T11 is progressive. IMPRESSION: 1. Interval progression of left lower lobe pulmonary nodules now measuring up to 7 mm. Metastatic disease a concern. Nodule size below accepted threshold for reliable resolution on PET imaging. Consider follow-up CT chest in 3 months to re-evaluate. 2. Collapse/consolidative changes in the dependent lower lobes bilaterally has decreased in the interval, but remains stable in the lingula. 3. New atelectasis or scarring in the lateral right upper lobe. Attention on follow-up recommended. 4. Interval progression of compression fractures at T7, T9, and T11. 5. Aortic Atherosclerosis (ICD10-I70.0) and Emphysema (ICD10-J43.9). Electronically Signed   By: EMisty StanleyM.D.   On: 03/11/2020 11:16     ASSESSMENT:  1.  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 -She was being monitored with intermittent scans. -CT chest with contrast on 03/10/2020 showed interval progression of left lower lobe lung nodules measuring up to 7 mm.  2.  Brain metastasis: -Presentation with expressive  aphasia to ER on 03/03/2019 along with right-sided weakness.  Found to have multiple brain metastasis and underwent SRS on 03/17/2019. 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 -Underwent SRS on 01/01/2020 for 2 subcentimeter brain lesions.   PLAN:  1.  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  There was slight progression of the left lower lobe lung nodules measuring up to 7 mm. -I have also recommended PD-L1 testing.  We will plan to repeat CT scan of the chest in 3 months.  2.  Iron deficiency state: -Hemoglobin today is 11.7 with MCV of 93.  3.  Osteoporosis: -Scan showed interval progression of compression fractures of T7, T9 and T11. -She is receiving Prolia injections through Dr. Dennard Schaumann.    Orders placed this encounter:  No orders of the defined types were placed in this encounter.  Total time spent is 30 minutes with more than 50% of the time spent face-to-face discussing and reviewing scans, lab results, counseling and coordination of care.  Derek Jack, MD [place] [phone]   I, Milinda Antis, am acting as a scribe for Dr. Sanda Linger.  I, Derek Jack MD, have reviewed the above documentation for accuracy and completeness, and I agree with the above.

## 2020-03-16 ENCOUNTER — Other Ambulatory Visit: Payer: Self-pay | Admitting: Family Medicine

## 2020-03-16 ENCOUNTER — Encounter (HOSPITAL_COMMUNITY): Payer: Self-pay | Admitting: *Deleted

## 2020-03-16 NOTE — Progress Notes (Signed)
I emailed pathology and requested foundation one and PDL1 on accession # [id number].

## 2020-03-17 DIAGNOSIS — C3490 Malignant neoplasm of unspecified part of unspecified bronchus or lung: Secondary | ICD-10-CM | POA: Diagnosis not present

## 2020-03-22 ENCOUNTER — Telehealth (INDEPENDENT_AMBULATORY_CARE_PROVIDER_SITE_OTHER): Payer: PPO | Admitting: Family Medicine

## 2020-03-22 ENCOUNTER — Other Ambulatory Visit: Payer: Self-pay

## 2020-03-22 ENCOUNTER — Telehealth (HOSPITAL_COMMUNITY): Payer: Self-pay | Admitting: *Deleted

## 2020-03-22 DIAGNOSIS — J310 Chronic rhinitis: Secondary | ICD-10-CM

## 2020-03-22 DIAGNOSIS — J329 Chronic sinusitis, unspecified: Secondary | ICD-10-CM | POA: Diagnosis not present

## 2020-03-22 MED ORDER — AMOXICILLIN 875 MG PO TABS
875.0000 mg | ORAL_TABLET | Freq: Two times a day (BID) | ORAL | 0 refills | Status: DC
Start: 2020-03-22 — End: 2020-04-18

## 2020-03-22 NOTE — Progress Notes (Signed)
Subjective:    Patient ID: [name], female    DOB: [birth year], 79 y.o.   MRN: [id number]  HPI Patient is being seen today as a telephone visit.  She consents to be seen via telephone.  Phone call began at 11:00.  Phone call concluded at 1115.  Total time including charting was 20 minutes.  Patient reports that she started feeling weak and developing a sore throat last week.  She has been dealing with sinus congestion and postnasal drip for more than a week.  She has now developed sinus pressure and a severe sore throat per tickly over the weekend.  The patient reports that she is coughing up out of her throat thick yellow purulent debris.  She describes it as the consistency of lemon custard.  This is coming from drainage from her sinuses and not from her chest.  She reports pressure in her sinuses.  She also reports a severe sore throat that hurts to swallow.  Patient is immunocompromise and has stage IV adenocarcinoma of the lung.  She also recently had stereotactic surgery for brain metastasis.  Recently she was at a rehearsal dinner for a wedding.  She was around numerous people.  She is uncertain if she was exposed to anyone who was sick. Past Medical History:  Diagnosis Date  . Adenocarcinoma of lung ([place]) 12/2010   left lung/surg only  . Allergic rhinitis   . 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 Anxiety disorder   . Aortic aneurysm ([place])   . Brain metastasis ([place])    lung cancer s/p resection 7/20  . Complication of anesthesia 2011   bloodpressure dropped during colonoscopy, not problems since  . Compression fracture   . COPD (chronic obstructive pulmonary disease) ([place])   . Depression   . Diastolic dysfunction   . Diverticulosis   . Dysphagia   . Emphysema lung ([place]) 11/08/2014  . Headache   . Hiatal hernia   . History of kidney stones   . Hypothyroidism   . IBS (irritable bowel syndrome)   . Iron deficiency anemia due to chronic blood loss  12/05/2016  . On home O2 12/15/2013   chronic hypoxia  . Pneumonia   . PUD (peptic ulcer disease)    53yrs  . Shortness of breath    with exertion  . SIADH (syndrome of inappropriate ADH production) ([place])   . Trigeminal neuralgia    Past Surgical History:  Procedure Laterality Date  . ABDOMINAL HYSTERECTOMY    . APPLICATION OF CRANIAL NAVIGATION N/A 03/18/2019   Procedure: APPLICATION OF CRANIAL NAVIGATION;  Surgeon: Consuella Lose, MD;  Location: [place];  Service: Neurosurgery;  Laterality: N/A;  . BACK SURGERY  January 13, 2014  . CATARACT EXTRACTION    . CATARACT EXTRACTION W/PHACO  09/02/2012   Procedure: CATARACT EXTRACTION PHACO AND INTRAOCULAR LENS PLACEMENT (IOC);  Surgeon: Elta Guadeloupe T. Gershon Crane, MD;  [facility];  Service: Ophthalmology;  Laterality: Left;  CDE:10.35  . CHOLECYSTECTOMY    . 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 COLONOSCOPY N/A 11/23/2014   Dr. Oneida Alar: moderate diverticulosis, hemorrhoids, redundant colon. next TCS in 10-15 years.   . CRANIOTOMY Left 03/18/2019   Procedure: Left stereotactic craniotomy for tumor resection;  Surgeon: Consuella Lose, MD;  Location: [place];  Service: Neurosurgery;  Laterality: Left;  Left stereotactic craniotomy for tumor resection  . ENDARTERECTOMY Right 08/21/2013   Procedure:  RIGHT CAROTID ANEURYSM RESECTION;  Surgeon: Rosetta Posner, MD;  Location: [place];  Service: Vascular;  Laterality: Right;  . ESOPHAGEAL DILATION  02/24/2018   Procedure: ESOPHAGEAL DILATION;  Surgeon: Danie Binder, MD;  [facility];  Service: Endoscopy;;  . ESOPHAGOGASTRODUODENOSCOPY  11/13/2009   w/dilation to 73mm, gastric ulceration (H.Pylori) s/p treatment  . ESOPHAGOGASTRODUODENOSCOPY  11/21/2009   distal esophageal web, gastritis  . ESOPHAGOGASTRODUODENOSCOPY  03/14/12   HCW:[id number] in the distal esophagus/Mild gastritis/small HH. + H.pylori, prescribed Pylera. Finished treatment.   .  ESOPHAGOGASTRODUODENOSCOPY N/A 12/13/2017   Dr. Oneida Alar: esophageal web s/p dilation, gastritis, no h.pylori  . ESOPHAGOGASTRODUODENOSCOPY N/A 02/24/2018   Dr. Oneida Alar: web in prox esophagus and in distal esophagus s/p dilation, mild gastritis, mild pyloric stenosis, mild post ulcer duodenal deformity at D1/D2  . fatty tumor removal from lt groin    . FRACTURE SURGERY Left    hand and left ring finger  . GIVENS CAPSULE STUDY N/A 12/26/2017   incomplete  . GIVENS CAPSULE STUDY N/A 02/24/2018   normal small bowel  . KNEE SURGERY     patella tendon repair june 2019 [facility]  . LUNG CANCER SURGERY  12/2010   Left VATS, minithoracotomy, LLL superior segmentectomy  . ORIF PATELLA Right 03/18/2018   Procedure: OPEN REDUCTION INTERNAL (ORIF) FIXATION RIGHT PATELLA;  Surgeon: Carole Civil, MD;  [facility];  Service: Orthopedics;  Laterality: Right;  . ORIF WRIST FRACTURE Left 04/09/2014   Procedure: OPEN REDUCTION INTERNAL FIXATION (ORIF) WRIST FRACTURE;  Surgeon: Renette Butters, MD;  Location: [place];  Service: Orthopedics;  Laterality: Left;  . PERCUTANEOUS PINNING Left 04/09/2014   Procedure: PERCUTANEOUS PINNING EXTREMITY;  Surgeon: Renette Butters, MD;  Location: [place];  Service: Orthopedics;  Laterality: Left;  . SAVORY DILATION N/A 12/13/2017   Procedure: SAVORY DILATION;  Surgeon: Danie Binder, MD;  [facility];  Service: Endoscopy;  Laterality: N/A;  . TUBAL LIGATION    . vocal cord surgery  02/06/2011   laryngoscopy with bilateral vocal cord Radiesse injection for vocal cord paralysis  . YAG LASER APPLICATION Left 17/61/6073   Procedure: YAG LASER APPLICATION;  Surgeon: Rutherford Guys, MD;  [facility];  Service: Ophthalmology;  Laterality: Left;   Current Outpatient Medications on File Prior to Visit  Medication Sig Dispense Refill  . albuterol (VENTOLIN HFA) 108 (90 Base) MCG/ACT inhaler Inhale 2 puffs into the lungs every 6 (six) hours as needed for wheezing or  shortness of breath. (Patient not taking: Reported on 03/15/2020) 18 g 2  . ALPRAZolam (XANAX) 0.5 MG tablet TAKE (1) TABLET BY MOUTH TWICE A DAY AS NEEDED. (Patient taking differently: at bedtime. ) 60 tablet 3  . citalopram (CELEXA) 40 MG tablet TAKE (1/2) TABLET BY MOUTH AT BEDTIME. 45 tablet 5  . denosumab (PROLIA) 60 MG/ML SOSY injection Inject 60 mg into the skin every 6 (six) months. 1 mL 0  . diphenhydrAMINE (BENADRYL) 50 MG tablet Take 1 tablet (50 mg total) by mouth as directed for 1 dose. Take Benadryl 50 mg 1 hour before your CT scan. (Patient not taking: Reported on 03/15/2020) 1 tablet 0  . ferrous sulfate 325 (65 FE) MG tablet Take 325 mg by mouth daily with breakfast.    . fluticasone (FLONASE) 50 MCG/ACT nasal spray INSTILL 2 SPRAYS INTO BOTH NOSTRILS DAILY. 16 g 0  . levocetirizine (XYZAL) 5 MG tablet TAKE ONE TABLET BY MOUTH ONCE DAILY. 30 tablet 5  .  levothyroxine (SYNTHROID) 50 MCG tablet TAKE 1 TABLET BEFORE BREAKFAST. 90 tablet 0  . linaclotide (LINZESS) 145 MCG CAPS capsule Take 1 capsule (145 mcg total) by mouth daily before breakfast. 30 capsule 5  . Multiple Vitamin (MULTIVITAMIN WITH MINERALS) TABS tablet Take 1 tablet by mouth daily.    Marland Kitchen OVER THE COUNTER MEDICATION Bone Support 2-4 tabs daily    . OXcarbazepine (TRILEPTAL) 150 MG tablet TAKE (1) TABLET BY MOUTH TWICE DAILY. 60 tablet 0  . pantoprazole (PROTONIX) 40 MG tablet TAKE (1) TABLET BY MOUTH ONCE DAILY BEFORE BREAKFAST. 90 tablet 3  . predniSONE (DELTASONE) 10 MG tablet TAKE 5 TABLETS (50MG ) ATT13 HOURS, 7 HOURS, AND 1FHOUR BEFORE CT SCAN.    . Probiotic Product (ALIGN PO) Take by mouth daily.    Marland Kitchen tiZANidine (ZANAFLEX) 4 MG tablet TAKE (1) TABLET BY MOUTH EVERY SIX HOURS AS NEEDED FOR MUSCLE SPASMS. (Patient not taking: Reported on 03/15/2020) 30 tablet 0  . TRELEGY ELLIPTA 100-62.5-25 MCG/INH AEPB INHALE 1 PUFF INTO LUNGS DAILY. 60 each 5   No current facility-administered medications on file prior to visit.    Allergies  Allergen Reactions  . Ciprofloxacin Hcl Hives  . Sulfonamide Derivatives Hives and Rash  . Iohexol Other (See Comments)    UNSPECIFIED REACTION  Desc: [facility], pt is allergic to IV contrast, no type of reaction was available. Pt cannot remember but first reacted around 15 yrs ago from an IVP. Notes were date from 2009 at urology center., Onset Date: [id number]   . Iodinated Diagnostic Agents Rash and Itching  . Prozac [Fluoxetine Hcl] Rash   Social History   Socioeconomic History  . Marital status: Divorced    Spouse name: Not on file  . Number of children: Not on file  . Years of education: Not on file  . Highest education level: Not on file  Occupational History  . Not on file  Tobacco Use  . Smoking status: Former Smoker    Packs/day: 0.50    Years: 20.00    Pack years: 10.00    Types: Cigarettes    Quit date: 12/21/2010    Years since quitting: 9.2  . Smokeless tobacco: Never Used  . Tobacco comment: smoking cessation info given and reviewed   Vaping Use  . Vaping Use: Never used  Substance and Sexual Activity  . Alcohol use: Not Currently    Alcohol/week: 0.0 standard drinks    Comment: seldom  . Drug use: No  . Sexual activity: Yes    Birth control/protection: Surgical  Other Topics Concern  . Not on file  Social History Narrative  . Not on file   Social Determinants of Health   Financial Resource Strain: Low Risk   . Difficulty of Paying Living Expenses: Not hard at all  Food Insecurity:   . Worried About Charity fundraiser in the Last Year:   . Arboriculturist in the Last Year:   Transportation Needs:   . Film/video editor (Medical):   Marland Kitchen Lack of Transportation (Non-Medical):   Physical Activity:   . Days of Exercise per Week:   . Minutes of Exercise per Session:   Stress:   . Feeling of Stress :   Social Connections:   . Frequency of Communication with Friends and Family:   . Frequency of Social Gatherings with  Friends and Family:   . Attends Religious Services:   . Active Member of Clubs or Organizations:   .  Attends Archivist Meetings:   Marland Kitchen Marital Status:   Intimate Partner Violence:   . Fear of Current or Ex-Partner:   . Emotionally Abused:   Marland Kitchen Physically Abused:   . Sexually Abused:       Review of Systems  All other systems reviewed and are negative.      Objective:   Physical Exam        Assessment & Plan:  Rhinosinusitis  I suspect the patient has a sore throat due to drainage from rhinosinusitis.  Other possibility would be strep pharyngitis.  Patient is already on allergy medication.  Therefore I will add amoxicillin 875 mg p.o. twice daily for possible sinus infection.  This will also cover strep throat.  I will treat the patient aggressively given her immunocompromise status.  Reassess in 48 hours if no better or sooner if worsening.

## 2020-03-22 NOTE — Telephone Encounter (Signed)
Pt called clinic wanting to find out what she needed to do to send off for the special testing Dr. Delton Coombes wanted. I advised the pt that there was nothing that she needed to do and that the special test had already been ordered. She verbalized understanding.

## 2020-03-24 ENCOUNTER — Encounter: Payer: Self-pay | Admitting: Family Medicine

## 2020-03-24 ENCOUNTER — Encounter (HOSPITAL_COMMUNITY): Payer: Self-pay | Admitting: Hematology

## 2020-03-25 ENCOUNTER — Other Ambulatory Visit: Payer: Self-pay | Admitting: Family Medicine

## 2020-03-25 ENCOUNTER — Other Ambulatory Visit: Payer: Self-pay

## 2020-03-25 ENCOUNTER — Encounter: Payer: Self-pay | Admitting: Family Medicine

## 2020-03-25 ENCOUNTER — Ambulatory Visit (HOSPITAL_COMMUNITY)
Admission: RE | Admit: 2020-03-25 | Discharge: 2020-03-25 | Disposition: A | Discharge status: Home / Self Care | Payer: PPO | Source: Ambulatory Visit | Attending: Family Medicine | Admitting: Family Medicine

## 2020-03-25 DIAGNOSIS — R05 Cough: Secondary | ICD-10-CM | POA: Diagnosis not present

## 2020-03-25 DIAGNOSIS — R059 Cough, unspecified: Secondary | ICD-10-CM

## 2020-03-25 DIAGNOSIS — J90 Pleural effusion, not elsewhere classified: Secondary | ICD-10-CM | POA: Diagnosis not present

## 2020-03-27 ENCOUNTER — Encounter: Payer: Self-pay | Admitting: Family Medicine

## 2020-03-28 ENCOUNTER — Encounter: Payer: Self-pay | Admitting: Family Medicine

## 2020-03-28 ENCOUNTER — Other Ambulatory Visit: Payer: Self-pay

## 2020-03-28 ENCOUNTER — Telehealth (INDEPENDENT_AMBULATORY_CARE_PROVIDER_SITE_OTHER): Payer: PPO | Admitting: Nurse Practitioner

## 2020-03-28 DIAGNOSIS — R059 Cough, unspecified: Secondary | ICD-10-CM

## 2020-03-28 DIAGNOSIS — J329 Chronic sinusitis, unspecified: Secondary | ICD-10-CM

## 2020-03-28 DIAGNOSIS — J310 Chronic rhinitis: Secondary | ICD-10-CM

## 2020-03-28 DIAGNOSIS — R05 Cough: Secondary | ICD-10-CM

## 2020-03-28 NOTE — Progress Notes (Signed)
Virtual Visit via Telephone Note  I connected with [name] on 03/28/20 at 10:15 AM EDT by telephone and verified that I am speaking with the correct person using two identifiers.   I discussed the limitations, risks, security and privacy concerns of performing an evaluation and management service by telephone and the availability of in person appointments. I also discussed with the patient that there may be a patient responsible charge related to this service. The patient expressed understanding and agreed to proceed.   History of Present Illness: Pt is a 79 year old female presenting with sxs of mucous, coughing, feeling yucky, h/a, achy, no fever, or chills last week and was treated with antibiotic for coverage for possible strep throat/immunocompromised state. Has h/o emphysema and seasonal allergies.   She reported update that she is feeling better. She took TheraFlu sever flu and cold at half dose last night last night and this morning with good improvement today of phlegm and coughing. Sore throat and other stated sxs have resolved.   She has MRI scheduled and wondering if she should cancel as her cough is better but continues. Advised that she should not reschedule this time r/t difficult and delay in scheduling timeline and possibility that her cough may be resolved at her apt time in approximately a week. She should wait until 24 hours prior to apt then re-schedule if cough continues.    Past Medical History:  Diagnosis Date  . Adenocarcinoma of lung ([place]) 12/2010   left lung/surg only  . Allergic rhinitis   . 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[place])    corrected by surgery 08/21/13  . Anxiety disorder   . Aortic aneurysm ([place])   . Brain metastasis ([place])    lung cancer s/p resection 7/20  . Complication of anesthesia 2011   bloodpressure dropped during colonoscopy, not problems since  . Compression fracture   . COPD (chronic obstructive pulmonary disease) ([place])   . Depression   .  Diastolic dysfunction   . Diverticulosis   . Dysphagia   . Emphysema lung ([place]) 11/08/2014  . Headache   . Hiatal hernia   . History of kidney stones   . Hypothyroidism   . IBS (irritable bowel syndrome)   . Iron deficiency anemia due to chronic blood loss 12/05/2016  . On home O2 12/15/2013   chronic hypoxia  . Pneumonia   . PUD (peptic ulcer disease)    32yrs  . Shortness of breath    with exertion  . SIADH (syndrome of inappropriate ADH production) ([place])   . Trigeminal neuralgia    Past Surgical History:  Procedure Laterality Date  . ABDOMINAL HYSTERECTOMY    . APPLICATION OF CRANIAL NAVIGATION N/A 03/18/2019   Procedure: APPLICATION OF CRANIAL NAVIGATION;  Surgeon: Consuella Lose, MD;  Location: [place];  Service: Neurosurgery;  Laterality: N/A;  . BACK SURGERY  January 13, 2014  . CATARACT EXTRACTION    . CATARACT EXTRACTION W/PHACO  09/02/2012   Procedure: CATARACT EXTRACTION PHACO AND INTRAOCULAR LENS PLACEMENT (IOC);  Surgeon: Elta Guadeloupe T. Gershon Crane, MD;  [facility];  Service: Ophthalmology;  Laterality: Left;  CDE:10.35  . CHOLECYSTECTOMY    . 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 COLONOSCOPY N/A 11/23/2014   Dr. Oneida Alar: moderate diverticulosis, hemorrhoids, redundant colon. next TCS in 10-15 years.   . CRANIOTOMY Left 03/18/2019   Procedure: Left stereotactic craniotomy for tumor resection;  Surgeon: Consuella Lose, MD;  [facility];  Service: Neurosurgery;  Laterality: Left;  Left stereotactic craniotomy for tumor resection  . ENDARTERECTOMY Right 08/21/2013   Procedure: RIGHT CAROTID ANEURYSM RESECTION;  Surgeon: Rosetta Posner, MD;  Location: [place];  Service: Vascular;  Laterality: Right;  . ESOPHAGEAL DILATION  02/24/2018   Procedure: ESOPHAGEAL DILATION;  Surgeon: Danie Binder, MD;  [facility];  Service: Endoscopy;;  . ESOPHAGOGASTRODUODENOSCOPY  11/13/2009   w/dilation to 29mm, gastric ulceration (H.Pylori) s/p  treatment  . ESOPHAGOGASTRODUODENOSCOPY  11/21/2009   distal esophageal web, gastritis  . ESOPHAGOGASTRODUODENOSCOPY  03/14/12   [id number]:[id number] in the distal esophagus/Mild gastritis/small HH. + H.pylori, prescribed Pylera. Finished treatment.   . ESOPHAGOGASTRODUODENOSCOPY N/A 12/13/2017   Dr. Oneida Alar: esophageal web s/p dilation, gastritis, no h.pylori  . ESOPHAGOGASTRODUODENOSCOPY N/A 02/24/2018   Dr. Oneida Alar: web in prox esophagus and in distal esophagus s/p dilation, mild gastritis, mild pyloric stenosis, mild post ulcer duodenal deformity at D1/D2  . fatty tumor removal from lt groin    . FRACTURE SURGERY Left    hand and left ring finger  . GIVENS CAPSULE STUDY N/A 12/26/2017   incomplete  . GIVENS CAPSULE STUDY N/A 02/24/2018   normal small bowel  . KNEE SURGERY     patella tendon repair june 2019 [facility]  . LUNG CANCER SURGERY  12/2010   Left VATS, minithoracotomy, LLL superior segmentectomy  . ORIF PATELLA Right 03/18/2018   Procedure: OPEN REDUCTION INTERNAL (ORIF) FIXATION RIGHT PATELLA;  Surgeon: Carole Civil, MD;  [facility];  Service: Orthopedics;  Laterality: Right;  . ORIF WRIST FRACTURE Left 04/09/2014   Procedure: OPEN REDUCTION INTERNAL FIXATION (ORIF) WRIST FRACTURE;  Surgeon: Renette Butters, MD;  Location: [place];  Service: Orthopedics;  Laterality: Left;  . PERCUTANEOUS PINNING Left 04/09/2014   Procedure: PERCUTANEOUS PINNING EXTREMITY;  Surgeon: Renette Butters, MD;  Location: [place];  Service: Orthopedics;  Laterality: Left;  . SAVORY DILATION N/A 12/13/2017   Procedure: SAVORY DILATION;  Surgeon: Danie Binder, MD;  [facility];  Service: Endoscopy;  Laterality: N/A;  . TUBAL LIGATION    . vocal cord surgery  02/06/2011   laryngoscopy with bilateral vocal cord Radiesse injection for vocal cord paralysis  . YAG LASER APPLICATION Left 59/16/3846   Procedure: YAG LASER APPLICATION;  Surgeon: Rutherford Guys, MD;  [facility];  Service: Ophthalmology;   Laterality: Left;   Current Outpatient Medications on File Prior to Visit  Medication Sig Dispense Refill  . albuterol (VENTOLIN HFA) 108 (90 Base) MCG/ACT inhaler Inhale 2 puffs into the lungs every 6 (six) hours as needed for wheezing or shortness of breath. (Patient not taking: Reported on 03/15/2020) 18 g 2  . ALPRAZolam (XANAX) 0.5 MG tablet TAKE (1) TABLET BY MOUTH TWICE A DAY AS NEEDED. (Patient taking differently: at bedtime. ) 60 tablet 3  . amoxicillin (AMOXIL) 875 MG tablet Take 1 tablet (875 mg total) by mouth 2 (two) times daily. 20 tablet 0  . citalopram (CELEXA) 40 MG tablet TAKE (1/2) TABLET BY MOUTH AT BEDTIME. 45 tablet 5  . denosumab (PROLIA) 60 MG/ML SOSY injection Inject 60 mg into the skin every 6 (six) months. 1 mL 0  . diphenhydrAMINE (BENADRYL) 50 MG tablet Take 1 tablet (50 mg total) by mouth as directed for 1 dose. Take Benadryl 50 mg 1 hour before your CT scan. (Patient not taking: Reported on 03/15/2020) 1 tablet 0  . ferrous sulfate 325 (65 FE) MG tablet Take 325  mg by mouth daily with breakfast.    . fluticasone (FLONASE) 50 MCG/ACT nasal spray INSTILL 2 SPRAYS INTO BOTH NOSTRILS DAILY. 16 g 0  . levocetirizine (XYZAL) 5 MG tablet TAKE ONE TABLET BY MOUTH ONCE DAILY. 30 tablet 5  . levothyroxine (SYNTHROID) 50 MCG tablet TAKE 1 TABLET BEFORE BREAKFAST. 90 tablet 0  . linaclotide (LINZESS) 145 MCG CAPS capsule Take 1 capsule (145 mcg total) by mouth daily before breakfast. 30 capsule 5  . Multiple Vitamin (MULTIVITAMIN WITH MINERALS) TABS tablet Take 1 tablet by mouth daily.    Marland Kitchen OVER THE COUNTER MEDICATION Bone Support 2-4 tabs daily    . OXcarbazepine (TRILEPTAL) 150 MG tablet TAKE (1) TABLET BY MOUTH TWICE DAILY. 60 tablet 0  . pantoprazole (PROTONIX) 40 MG tablet TAKE (1) TABLET BY MOUTH ONCE DAILY BEFORE BREAKFAST. 90 tablet 3  . predniSONE (DELTASONE) 10 MG tablet TAKE 5 TABLETS (50MG ) ATT13 HOURS, 7 HOURS, AND 1FHOUR BEFORE CT SCAN.    . Probiotic Product (ALIGN  PO) Take by mouth daily.    Marland Kitchen tiZANidine (ZANAFLEX) 4 MG tablet TAKE (1) TABLET BY MOUTH EVERY SIX HOURS AS NEEDED FOR MUSCLE SPASMS. (Patient not taking: Reported on 03/15/2020) 30 tablet 0  . TRELEGY ELLIPTA 100-62.5-25 MCG/INH AEPB INHALE 1 PUFF INTO LUNGS DAILY. 60 each 5   No current facility-administered medications on file prior to visit.   Allergies  Allergen Reactions  . Ciprofloxacin Hcl Hives  . Sulfonamide Derivatives Hives and Rash  . Iohexol Other (See Comments)    UNSPECIFIED REACTION  Desc: [facility], pt is allergic to IV contrast, no type of reaction was available. Pt cannot remember but first reacted around 15 yrs ago from an IVP. Notes were date from 2009 at urology center., Onset Date: [id number]   . Iodinated Diagnostic Agents Rash and Itching  . Prozac [Fluoxetine Hcl] Rash   Social History   Socioeconomic History  . Marital status: Divorced    Spouse name: Not on file  . Number of children: Not on file  . Years of education: Not on file  . Highest education level: Not on file  Occupational History  . Not on file  Tobacco Use  . Smoking status: Former Smoker    Packs/day: 0.50    Years: 20.00    Pack years: 10.00    Types: Cigarettes    Quit date: 12/21/2010    Years since quitting: 9.2  . Smokeless tobacco: Never Used  . Tobacco comment: smoking cessation info given and reviewed   Vaping Use  . Vaping Use: Never used  Substance and Sexual Activity  . Alcohol use: Not Currently    Alcohol/week: 0.0 standard drinks    Comment: seldom  . Drug use: No  . Sexual activity: Yes    Birth control/protection: Surgical  Other Topics Concern  . Not on file  Social History Narrative  . Not on file   Social Determinants of Health   Financial Resource Strain: Low Risk   . Difficulty of Paying Living Expenses: Not hard at all  Food Insecurity:   . Worried About Charity fundraiser in the Last Year:   . Arboriculturist in the Last Year:    Transportation Needs:   . Film/video editor (Medical):   Marland Kitchen Lack of Transportation (Non-Medical):   Physical Activity:   . Days of Exercise per Week:   . Minutes of Exercise per Session:   Stress:   . Feeling of  Stress :   Social Connections:   . Frequency of Communication with Friends and Family:   . Frequency of Social Gatherings with Friends and Family:   . Attends Religious Services:   . Active Member of Clubs or Organizations:   . Attends Archivist Meetings:   Marland Kitchen Marital Status:   Intimate Partner Violence:   . Fear of Current or Ex-Partner:   . Emotionally Abused:   Marland Kitchen Physically Abused:   . Sexually Abused:    Breast Cancer-relatedfamily history is not on file. Immunization History  Administered Date(s) Administered  . Fluad Quad(high Dose 65+) 06/19/2019  . Hepatitis B 02/17/2007, 04/17/2007, 09/12/2007  . Influenza, High Dose Seasonal PF 07/11/2018  . Influenza,inj,Quad PF,6+ Mos 07/12/2015, 07/12/2016, 06/11/2017  . Pneumococcal Conjugate-13 11/27/2019  . Pneumococcal Polysaccharide-23 07/19/2014  . Td 02/17/2007  . Tdap 04/09/2014  . Zoster 01/12/2009   Observations/Objective: A/ox3, no harsh cough, able to speak full sentences  Assessment and Plan: Rhinosinusitis  Cough   Continue prior treatment plan from 03/22/2020 Continue medication already taking Finish antibiotic] Drink plenty of fluids Get plenty of rest Cough, deep breath every hour while awake, keep moving to prevent pneumonia Walk frequently Seek medical treatment promptly for decline in medical condition   Follow Up Instructions:    I discussed the assessment and treatment plan with the patient. The patient was provided an opportunity to ask questions and all were answered. The patient agreed with the plan and demonstrated an understanding of the instructions.   The patient was advised to call back or seek an in-person evaluation if the symptoms worsen or if the condition  fails to improve as anticipated.  I provided 10 minutes of non-face-to-face time during this encounter. Time ended [place], FNP

## 2020-03-29 ENCOUNTER — Telehealth: Payer: Self-pay | Admitting: Family Medicine

## 2020-03-29 DIAGNOSIS — C3490 Malignant neoplasm of unspecified part of unspecified bronchus or lung: Secondary | ICD-10-CM | POA: Diagnosis not present

## 2020-03-29 NOTE — Telephone Encounter (Signed)
CB# [id number] Has question concerning up coming MRI appt

## 2020-03-30 ENCOUNTER — Other Ambulatory Visit: Payer: Self-pay

## 2020-03-30 ENCOUNTER — Encounter (HOSPITAL_COMMUNITY): Payer: Self-pay

## 2020-03-30 ENCOUNTER — Emergency Department (HOSPITAL_COMMUNITY): Payer: PPO

## 2020-03-30 ENCOUNTER — Other Ambulatory Visit: Payer: Self-pay | Admitting: Family Medicine

## 2020-03-30 ENCOUNTER — Emergency Department (HOSPITAL_COMMUNITY)
Admission: EM | Admit: 2020-03-30 | Discharge: 2020-03-30 | Disposition: A | Discharge status: Home / Self Care | Payer: PPO | Attending: Emergency Medicine | Admitting: Emergency Medicine

## 2020-03-30 DIAGNOSIS — R05 Cough: Secondary | ICD-10-CM | POA: Diagnosis not present

## 2020-03-30 DIAGNOSIS — J449 Chronic obstructive pulmonary disease, unspecified: Secondary | ICD-10-CM | POA: Diagnosis not present

## 2020-03-30 DIAGNOSIS — Z20822 Contact with and (suspected) exposure to covid-19: Secondary | ICD-10-CM | POA: Diagnosis not present

## 2020-03-30 DIAGNOSIS — R0902 Hypoxemia: Secondary | ICD-10-CM | POA: Insufficient documentation

## 2020-03-30 DIAGNOSIS — I10 Essential (primary) hypertension: Secondary | ICD-10-CM | POA: Insufficient documentation

## 2020-03-30 DIAGNOSIS — Z79899 Other long term (current) drug therapy: Secondary | ICD-10-CM | POA: Insufficient documentation

## 2020-03-30 DIAGNOSIS — R0602 Shortness of breath: Secondary | ICD-10-CM | POA: Diagnosis not present

## 2020-03-30 DIAGNOSIS — J209 Acute bronchitis, unspecified: Secondary | ICD-10-CM

## 2020-03-30 DIAGNOSIS — E039 Hypothyroidism, unspecified: Secondary | ICD-10-CM | POA: Insufficient documentation

## 2020-03-30 DIAGNOSIS — J439 Emphysema, unspecified: Secondary | ICD-10-CM | POA: Diagnosis not present

## 2020-03-30 DIAGNOSIS — Z87891 Personal history of nicotine dependence: Secondary | ICD-10-CM | POA: Diagnosis not present

## 2020-03-30 LAB — CBC WITH DIFFERENTIAL/PLATELET
Abs Immature Granulocytes: 0.04 10*3/uL (ref 0.00–0.07)
Basophils Absolute: 0 10*3/uL (ref 0.0–0.1)
Basophils Relative: 0 %
Eosinophils Absolute: 0.1 10*3/uL (ref 0.0–0.5)
Eosinophils Relative: 1 %
HCT: 39.2 % (ref 36.0–46.0)
Hemoglobin: 12.8 g/dL (ref 12.0–15.0)
Immature Granulocytes: 0 %
Lymphocytes Relative: 8 %
Lymphs Abs: 0.8 10*3/uL (ref 0.7–4.0)
MCH: 30.7 pg (ref 26.0–34.0)
MCHC: 32.7 g/dL (ref 30.0–36.0)
MCV: 94 fL (ref 80.0–100.0)
Monocytes Absolute: 0.9 10*3/uL (ref 0.1–1.0)
Monocytes Relative: 9 %
Neutro Abs: 7.7 10*3/uL (ref 1.7–7.7)
Neutrophils Relative %: 82 %
Platelets: 370 10*3/uL (ref 150–400)
RBC: 4.17 MIL/uL (ref 3.87–5.11)
RDW: 12.2 % (ref 11.5–15.5)
WBC: 9.5 10*3/uL (ref 4.0–10.5)
nRBC: 0 % (ref 0.0–0.2)

## 2020-03-30 LAB — BASIC METABOLIC PANEL
Anion gap: 11 (ref 5–15)
BUN: 16 mg/dL (ref 8–23)
CO2: 31 mmol/L (ref 22–32)
Calcium: 9.6 mg/dL (ref 8.9–10.3)
Chloride: 92 mmol/L — ABNORMAL LOW (ref 98–111)
Creatinine, Ser: 0.45 mg/dL (ref 0.44–1.00)
GFR calc Af Amer: >60 mL/min (ref >60)
GFR calc non Af Amer: >60 mL/min (ref >60)
Glucose, Bld: 112 mg/dL — ABNORMAL HIGH (ref 70–99)
Potassium: 4.2 mmol/L (ref 3.5–5.1)
Sodium: 134 mmol/L — ABNORMAL LOW (ref 135–145)

## 2020-03-30 LAB — SARS CORONAVIRUS 2 BY RT PCR (HOSPITAL ORDER, PERFORMED IN ~~LOC~~ HOSPITAL LAB): SARS Coronavirus 2: NEGATIVE

## 2020-03-30 MED ORDER — IPRATROPIUM-ALBUTEROL 0.5-2.5 (3) MG/3ML IN SOLN
3.0000 mL | Freq: Once | RESPIRATORY_TRACT | Status: AC
  Administered 2020-03-30: 3 mL via RESPIRATORY_TRACT
  Filled 2020-03-30: qty 3

## 2020-03-30 MED ORDER — ONDANSETRON HCL 4 MG/2ML IJ SOLN
4.0000 mg | Freq: Once | INTRAMUSCULAR | Status: AC
  Administered 2020-03-30: 4 mg via INTRAVENOUS
  Filled 2020-03-30: qty 2

## 2020-03-30 MED ORDER — AZITHROMYCIN 250 MG PO TABS
250.0000 mg | ORAL_TABLET | Freq: Every day | ORAL | 0 refills | Status: DC
Start: 2020-03-30 — End: 2020-04-18

## 2020-03-30 MED ORDER — PREDNISONE 50 MG PO TABS
60.0000 mg | ORAL_TABLET | Freq: Once | ORAL | Status: AC
  Administered 2020-03-30: 60 mg via ORAL
  Filled 2020-03-30: qty 1

## 2020-03-30 MED ORDER — PREDNISONE 10 MG (21) PO TBPK: ORAL_TABLET | ORAL | 0 refills | Status: DC

## 2020-03-30 NOTE — ED Notes (Signed)
Lab at bedside to obtain blood sample.

## 2020-03-30 NOTE — Discharge Instructions (Signed)
Please pick up medication and take as prescribed Follow up with your PCP this week for recheck of your symptoms Increase your O2 at home as needed Return to the ED for any worsening symptoms

## 2020-03-30 NOTE — ED Notes (Signed)
Patient up to bedside commode.

## 2020-03-30 NOTE — ED Notes (Signed)
Ambulated pt to fast track nurses station and back with walker. Pt reports ambulates at home with walker and on 2liters. Pt 02 saturation ranged from 90-92. Pt noted to be 87% when placed back in bed. 02 returned to 92% once pt settled in stretcher. Pt reports increased in headache, pain, nausea and reported to tell PA that pt lives alone.  PA aware of vital signs and in to re-assess pt.

## 2020-03-30 NOTE — Telephone Encounter (Signed)
Called [name] to follow up about a question she had about her upcoming MRI, she stated she had just been checked at the ER, and she would be intouch with [place].

## 2020-03-30 NOTE — Consult Note (Signed)
Patient Demographics  [name], is a 79 y.o. female   MRN: [id number]   DOB - 1941/08/27  Admit Date - 03/30/2020    Outpatient Primary MD for the patient is [name], MD  Consult requested in the Hospital by Davonna Belling, MD, On 03/30/2020    Reason for consult  - Shortness of breath   With History of -  Past Medical History:  Diagnosis Date  . 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 Allergic rhinitis   . 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 Anxiety disorder   . Aortic aneurysm ([place])   . Brain metastasis ([place])    lung cancer s/p resection 7/20  . Complication of anesthesia 2011   bloodpressure dropped during colonoscopy, not problems since  . Compression fracture   . COPD (chronic obstructive pulmonary disease) ([place])   . Depression   . Diastolic dysfunction   . Diverticulosis   . Dysphagia   . Emphysema lung ([place]) 11/08/2014  . Headache   . Hiatal hernia   . History of kidney stones   . Hypothyroidism   . IBS (irritable bowel syndrome)   . Iron deficiency anemia due to chronic blood loss 12/05/2016  . On home O2 12/15/2013   chronic hypoxia  . Pneumonia   . PUD (peptic ulcer disease)    68yrs  . Shortness of breath    with exertion  . SIADH (syndrome of inappropriate ADH production) ([place])   . Trigeminal neuralgia       Past Surgical History:  Procedure Laterality Date  . ABDOMINAL HYSTERECTOMY    . APPLICATION OF CRANIAL NAVIGATION N/A 03/18/2019   Procedure: APPLICATION OF CRANIAL NAVIGATION;  Surgeon: Consuella Lose, MD;  Location: [facility];  Service: Neurosurgery;  Laterality: N/A;  . BACK SURGERY  January 13, 2014  . BRAIN SURGERY N/A    Phreesia 03/28/2020  . CATARACT EXTRACTION    . CATARACT EXTRACTION W/PHACO  09/02/2012   Procedure: CATARACT EXTRACTION PHACO AND INTRAOCULAR LENS PLACEMENT (IOC);   Surgeon: Elta Guadeloupe T. Gershon Crane, MD;  Location: [facility];  Service: Ophthalmology;  Laterality: Left;  CDE:10.35  . CHOLECYSTECTOMY    . 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 COLONOSCOPY N/A 11/23/2014   Dr. Oneida Alar: moderate diverticulosis, hemorrhoids, redundant colon. next TCS in 10-15 years.   . CRANIOTOMY Left 03/18/2019   Procedure: Left stereotactic craniotomy for tumor resection;  Surgeon: Consuella Lose, MD;  Location: [facility];  Service: Neurosurgery;  Laterality: Left;  Left stereotactic craniotomy for tumor resection  . ENDARTERECTOMY Right 08/21/2013   Procedure: RIGHT CAROTID ANEURYSM RESECTION;  Surgeon: Rosetta Posner, MD;  Location: [facility];  Service: Vascular;  Laterality: Right;  . ESOPHAGEAL DILATION  02/24/2018   Procedure: ESOPHAGEAL DILATION;  Surgeon: Danie Binder, MD;  [facility];  Service: Endoscopy;;  . ESOPHAGOGASTRODUODENOSCOPY  11/13/2009   w/dilation to 52mm,  gastric ulceration (H.Pylori) s/p treatment  . ESOPHAGOGASTRODUODENOSCOPY  11/21/2009   distal esophageal web, gastritis  . ESOPHAGOGASTRODUODENOSCOPY  03/14/12   IWL:NLGXQJJHE in the distal esophagus/Mild gastritis/small HH. + H.pylori, prescribed Pylera. Finished treatment.   . ESOPHAGOGASTRODUODENOSCOPY N/A 12/13/2017   Dr. Oneida Alar: esophageal web s/p dilation, gastritis, no h.pylori  . ESOPHAGOGASTRODUODENOSCOPY N/A 02/24/2018   Dr. Oneida Alar: web in prox esophagus and in distal esophagus s/p dilation, mild gastritis, mild pyloric stenosis, mild post ulcer duodenal deformity at D1/D2  . fatty tumor removal from lt groin    . FRACTURE SURGERY Left    hand and left ring finger  . GIVENS CAPSULE STUDY N/A 12/26/2017   incomplete  . GIVENS CAPSULE STUDY N/A 02/24/2018   normal small bowel  . KNEE SURGERY     patella tendon repair june 2019 [facility]  . LUNG CANCER SURGERY  12/2010   Left VATS, minithoracotomy, LLL superior segmentectomy  . ORIF PATELLA Right 03/18/2018    Procedure: OPEN REDUCTION INTERNAL (ORIF) FIXATION RIGHT PATELLA;  Surgeon: Carole Civil, MD;  Location[facility];  Service: Orthopedics;  Laterality: Right;  . ORIF WRIST FRACTURE Left 04/09/2014   Procedure: OPEN REDUCTION INTERNAL FIXATION (ORIF) WRIST FRACTURE;  Surgeon: Renette Butters, MD;  Location: [facility];  Service: Orthopedics;  Laterality: Left;  . PERCUTANEOUS PINNING Left 04/09/2014   Procedure: PERCUTANEOUS PINNING EXTREMITY;  Surgeon: Renette Butters, MD;  Location: [facility];  Service: Orthopedics;  Laterality: Left;  . SAVORY DILATION N/A 12/13/2017   Procedure: SAVORY DILATION;  Surgeon: Danie Binder, MD;  [facility];  Service: Endoscopy;  Laterality: N/A;  . TUBAL LIGATION    . vocal cord surgery  02/06/2011   laryngoscopy with bilateral vocal cord Radiesse injection for vocal cord paralysis  . YAG LASER APPLICATION Left 17/40/8144   Procedure: YAG LASER APPLICATION;  Surgeon: Rutherford Guys, MD;  Location[facility];  Service: Ophthalmology;  Laterality: Left;    in for   Chief Complaint  Patient presents with  . Shortness of Breath     HPI  [name]  is a 79 y.o. female, with past medical history of adenocarcinoma of the lung, with brain metastasis, COPD on 2 L oxygen cannula at baseline, hypertension, hypothyroidism, patient presents to ED secondary to complaints of worsening shortness of breath over the last 2 weeks, been having sore throat about same period of time, reports has been having productive cough, yellow in color, he was seen via telemedicine with her PCP, where she was described amoxicillin twice daily, reported did not help much, reports she started to take some TheraFlu yesterday with some improvement of her symptoms, reports she did not take any of her Covid vaccines, but she did test negative for Covid in ED, patient on 2 L at baseline, chest x-ray in ED with no acute abnormalities, she was afebrile, with no leukocytosis, I was consulted to  evaluate if admission is needed.     Review of Systems    In addition to the HPI above,  No Fever-chills, She does report some occasional headache, No changes with Vision or hearing, No problems swallowing food or Liquids, No Chest pain, does report cough which is productive, and reports shortness of breath No Abdominal pain, reports some nausea, but no Vommitting, Bowel movements are regular, No Blood in stool or Urine, No dysuria, No new skin rashes or bruises, No new joints pains-aches,  No new weakness, tingling, numbness in any extremity, No recent weight  gain or loss, No polyuria, polydypsia or polyphagia, No significant Mental Stressors.  A full 10 point Review of Systems was done, except as stated above, all other Review of Systems were negative.   Social History Social History   Tobacco Use  . Smoking status: Former Smoker    Packs/day: 0.50    Years: 20.00    Pack years: 10.00    Types: Cigarettes    Quit date: 12/21/2010    Years since quitting: 9.2  . Smokeless tobacco: Never Used  . Tobacco comment: smoking cessation info given and reviewed   Substance Use Topics  . Alcohol use: Not Currently    Alcohol/week: 0.0 standard drinks    Comment: seldom     Family History Family History  Problem Relation Age of Onset  . Pulmonary embolism Mother   . Heart attack Father   . Hypertension Father   . Liver cancer Sister   . Cancer Sister   . Cancer Brother   . Cancer Daughter   . Colon cancer Neg Hx      Prior to Admission medications   Medication Sig Start Date End Date Taking? Authorizing Provider  albuterol (VENTOLIN HFA) 108 (90 Base) MCG/ACT inhaler Inhale 2 puffs into the lungs every 6 (six) hours as needed for wheezing or shortness of breath. Patient not taking: Reported on 03/15/2020 05/14/19   [name], MD  ALPRAZolam Duanne Moron) 0.5 MG tablet TAKE (1) TABLET BY MOUTH TWICE A DAY AS NEEDED. Patient taking differently: at bedtime.  11/10/19    [name], MD  amoxicillin (AMOXIL) 875 MG tablet Take 1 tablet (875 mg total) by mouth 2 (two) times daily. 03/22/20   [name], MD  azithromycin (ZITHROMAX) 250 MG tablet Take 1 tablet (250 mg total) by mouth daily. Take first 2 tablets together, then 1 every day until finished. 03/30/20   Eustaquio Maize, PA-C  citalopram (CELEXA) 40 MG tablet TAKE (1/2) TABLET BY MOUTH AT BEDTIME. 12/01/19   [name], MD  denosumab (PROLIA) 60 MG/ML SOSY injection Inject 60 mg into the skin every 6 (six) months. 12/03/19   [name], MD  diphenhydrAMINE (BENADRYL) 50 MG tablet Take 1 tablet (50 mg total) by mouth as directed for 1 dose. Take Benadryl 50 mg 1 hour before your CT scan. Patient not taking: Reported on 03/15/2020 03/10/20   Derek Jack, MD  ferrous sulfate 325 (65 FE) MG tablet Take 325 mg by mouth daily with breakfast.    [provider]  fluticasone (FLONASE) 50 MCG/ACT nasal spray INSTILL 2 SPRAYS INTO BOTH NOSTRILS DAILY. 02/29/20   [name], MD  levocetirizine (XYZAL) 5 MG tablet TAKE ONE TABLET BY MOUTH ONCE DAILY. 08/10/19   [name], MD  levothyroxine (SYNTHROID) 50 MCG tablet TAKE 1 TABLET BEFORE BREAKFAST. 03/16/20   [name], MD  linaclotide [facility]) 145 MCG CAPS capsule Take 1 capsule (145 mcg total) by mouth daily before breakfast. 01/13/20   Mahala Menghini, PA-C  Multiple Vitamin (MULTIVITAMIN WITH MINERALS) TABS tablet Take 1 tablet by mouth daily.    [provider]  OVER THE COUNTER MEDICATION Bone Support 2-4 tabs daily    [provider]  OXcarbazepine (TRILEPTAL) 150 MG tablet TAKE (1) TABLET BY MOUTH TWICE DAILY. 02/26/20   [name], MD  pantoprazole (PROTONIX) 40 MG tablet TAKE (1) TABLET BY MOUTH ONCE DAILY BEFORE BREAKFAST. 11/18/19   Annitta Needs, NP  predniSONE (STERAPRED Eleanora Neighbor  21 TAB) 10 MG (21) TBPK tablet Follow package insert 03/30/20   Alroy Bailiff, Margaux, PA-C  Probiotic Product  (ALIGN PO) Take by mouth daily.    [provider]  tiZANidine (ZANAFLEX) 4 MG tablet TAKE (1) TABLET BY MOUTH EVERY SIX HOURS AS NEEDED FOR MUSCLE SPASMS. Patient not taking: Reported on 03/15/2020 01/19/20   [name], MD  TRELEGY ELLIPTA 100-62.5-25 MCG/INH AEPB INHALE 1 PUFF INTO LUNGS DAILY. 12/01/19   [name], MD    Anti-infectives (From admission, onward)   Start     Dose/Rate Route Frequency Ordered Stop   03/30/20 0000  azithromycin (ZITHROMAX) 250 MG tablet     Discontinue     250 mg Oral Daily 03/30/20 1557        Scheduled Meds:  Continuous Infusions: PRN Meds:.  Allergies  Allergen Reactions  . Ciprofloxacin Hcl Hives  . Sulfonamide Derivatives Hives and Rash  . Iohexol Other (See Comments)    UNSPECIFIED REACTION  Desc: Per [facility], pt is allergic to IV contrast, no type of reaction was available. Pt cannot remember but first reacted around 15 yrs ago from an IVP. Notes were date from 2009 at urology center., Onset Date: [id number]   . Iodinated Diagnostic Agents Rash and Itching  . Prozac [Fluoxetine Hcl] Rash    Physical Exam  Vitals  Blood pressure 129/80, pulse (!) 102, temperature 98.7 F (37.1 C), temperature source Oral, resp. rate (!) 29, height 5\' 4"  (1.626 m), weight 52.6 kg, SpO2 92 %.   1. General elderly female, laying in bed, in no apparent distress  2. Normal affect and insight, Not Suicidal or Homicidal, Awake Alert, Oriented X 3.  3. No F.N deficits, ALL C.Nerves Intact, Strength 5/5 all 4 extremities, Sensation intact all 4 extremities, Plantars down going.  4. Ears and Eyes appear Normal, Conjunctivae clear, PERRLA. Moist Oral Mucosa.  Normal skin turgor  5. Supple Neck, No JVD, No cervical lymphadenopathy appriciated, No Carotid Bruits.  6. Symmetrical Chest wall movement, Good air movement bilaterally, CTAB.  7. RRR, No Gallops, Rubs or Murmurs, No Parasternal Heave.  8. Positive Bowel Sounds, Abdomen  Soft, No tenderness, No organomegaly appriciated,No rebound -guarding or rigidity.  9.  No Cyanosis, Normal Skin Turgor, No Skin Rash or Bruise.  10. Good muscle tone,  joints appear normal , no effusions, Normal ROM.   Data Review  CBC Recent Labs  Lab 03/30/20 1144  WBC 9.5  HGB 12.8  HCT 39.2  PLT 370  MCV 94.0  MCH 30.7  MCHC 32.7  RDW 12.2  LYMPHSABS 0.8  MONOABS 0.9  EOSABS 0.1  BASOSABS 0.0   ------------------------------------------------------------------------------------------------------------------  Chemistries  Recent Labs  Lab 03/30/20 1144  NA 134*  K 4.2  CL 92*  CO2 31  GLUCOSE 112*  BUN 16  CREATININE 0.45  CALCIUM 9.6   ------------------------------------------------------------------------------------------------------------------ estimated creatinine clearance is 48.1 mL/min (by C-G formula based on SCr of 0.45 mg/dL). ------------------------------------------------------------------------------------------------------------------ No results for input(s): TSH, T4TOTAL, T3FREE, THYROIDAB in the last 72 hours.  Invalid input(s): FREET3   Coagulation profile No results for input(s): INR, PROTIME in the last 168 hours. ------------------------------------------------------------------------------------------------------------------- No results for input(s): DDIMER in the last 72 hours. -------------------------------------------------------------------------------------------------------------------  Cardiac Enzymes No results for input(s): CKMB, TROPONINI, MYOGLOBIN in the last 168 hours.  Invalid input(s): CK ------------------------------------------------------------------------------------------------------------------ Invalid input(s): POCBNP   ---------------------------------------------------------------------------------------------------------------  Urinalysis    Component Value Date/Time   COLORURINE YELLOW  11/17/2019 [place] 11/17/2019  1451   LABSPEC 1.027 11/17/2019 1451   PHURINE 6.0 11/17/2019 1451   GLUCOSEU NEGATIVE 11/17/2019 1451   [place] 11/17/2019 [place] 06/04/2019 1441   KETONESUR NEGATIVE 11/17/2019 1451   PROTEINUR NEGATIVE 11/17/2019 1451   UROBILINOGEN 0.2 06/01/2015 1145   NITRITE NEGATIVE 11/17/2019 1451   LEUKOCYTESUR NEGATIVE 11/17/2019 1451     Imaging results:   DG Chest Portable 1 View  Result Date: 03/30/2020 CLINICAL DATA:  Cough EXAM: PORTABLE CHEST 1 VIEW COMPARISON:  03/25/2020 FINDINGS: Emphysema with chronic interstitial prominence. Stable atelectasis/scarring at the lung bases. No significant pleural effusion. Normal heart size. Partially imaged thoracolumbar fusion. IMPRESSION: No acute process in the chest. Electronically Signed   By: Macy Mis M.D.   On: 03/30/2020 12:26      Assessment & Plan  Active Problems:   Acute bronchitis   Acute bronchitis -Patient presents with worsening dyspnea, but no worsening hypoxia, she is on oxygen at baseline, chest x-ray with no acute findings, she is afebrile, no leukocytosis, she has no significant wheezing on my exam, her finding related to acute bronchitis, I have offered her to stay overnight for observation, but patient report to go home, which I think is very appropriate, I think she will benefit from 5 days course of azithromycin, and as well from 1 course of prednisone taper. Recommendations: -Patient can be discharged from ED, on prednisone taper and Z-Pak.    Thank you for the consult   Phillips Climes M.D on 03/30/2020 at 4:18 PM   After 7pm go to [email or web address]   Thank you for the consult, we will follow the patient with you in the [facility]   Office  [phone]

## 2020-03-30 NOTE — ED Provider Notes (Signed)
[facility] EMERGENCY DEPARTMENT Provider Note   CSN: [id number] Arrival date & time: 03/30/20  1039     History Chief Complaint  Patient presents with  . Shortness of Breath    [name] is a 79 y.o. 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  Patient reports she began having a sore throat about 1.5 weeks ago as well as a productive cough.  Had a telemedicine visit with [facility] on 6/15 with same complaint.  He was started on amoxicillin 875 mg p.o. twice daily to cover for possible sinus infection as well as possible strep throat.  Patient had another telemedicine visit on 6/21 for continuation of symptoms.  She was advised to continue taking the antibiotic as prescribed and to go to the ED immediately for any worsening symptoms.  Patient states that she has been taking TheraFlu which has helped with the cough however she continues to feel extremely fatigued causing her some concern.  She states that she has not had to increase her oxygen at home.  She does monitor her O2 sats and states that 94% is typically at her baseline.  She denies fevers.  She denies any recent sick contacts.  She has not been vaccinated for Covid.   The history is provided by the patient and medical records.       Past Medical History:  Diagnosis Date  . Adenocarcinoma of lung ([place]) 12/2010   left lung/surg only  . Allergic rhinitis   . 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 Anxiety disorder   . Aortic aneurysm ([place])   . Brain metastasis ([place])    lung cancer s/p resection 7/20  . Complication of anesthesia 2011   bloodpressure dropped during colonoscopy, not problems since  . Compression fracture   . COPD (chronic obstructive pulmonary disease) ([place])   . Depression   .  Diastolic dysfunction   . Diverticulosis   . Dysphagia   . Emphysema lung ([place]) 11/08/2014  . Headache   . Hiatal hernia   . History of kidney stones   . Hypothyroidism   . IBS (irritable bowel syndrome)   . Iron deficiency anemia due to chronic blood loss 12/05/2016  . On home O2 12/15/2013   chronic hypoxia  . Pneumonia   . PUD (peptic ulcer disease)    22yrs  . Shortness of breath    with exertion  . SIADH (syndrome of inappropriate ADH production) ([place])   . Trigeminal neuralgia     Patient Active Problem List   Diagnosis Date Noted  . Abdominal bloating 01/13/2020  . Primary cancer of left lower lobe of lung ([place]) 12/29/2019  . Flatulence 11/17/2019  . IBS (irritable bowel syndrome)   . Acute hypoxemic respiratory failure ([place]) 05/25/2019  . Pharyngeal dysphagia 05/07/2019  . Brain metastasis ([place]) 04/17/2019  . Brain tumor ([place]) 03/18/2019  . Essential hypertension 03/12/2019  . Brain metastases ([place]) 03/06/2019  . Cytotoxic brain edema ([place]) 03/04/2019  . ICH (intracerebral hemorrhage) ([facility]) - L parietal lobe 03/03/2019  . Nontraumatic intracerebral hemorrhage, unspecified ([place]) 03/03/2019  . Osteoporosis 05/16/2018  . S/P ORIF (open reduction internal fixation) fracture right patella 03/18/18 03/26/2018  . Other specified postprocedural states 03/26/2018  . Cellulitis, wound, post-operative 03/22/2018  . Fracture  of right patella 03/18/2018  . Normocytic anemia   . Esophageal dysphagia 11/27/2017  . Heme positive stool 11/27/2017  . Nasal crusting 02/06/2017  . Iron deficiency anemia 12/05/2016  . SIADH (syndrome of inappropriate ADH production) ([place])   . Polypharmacy 09/14/2016  . Muscle weakness (generalized)   . Diastolic dysfunction 44/10/270  . Syncope 09/10/2016  . Rib pain on left side   . Hypersomnia 02/16/2016  . Acute bronchitis 02/16/2016  . Exertional dyspnea 12/20/2015  . HLD (hyperlipidemia) 11/23/2014  . Change in bowel habits 11/10/2014  .  Abdominal pain, left lower quadrant 11/10/2014  . Emphysema lung ([place]) 11/08/2014  . Helicobacter pylori gastritis 07/30/2014  . Acute on chronic respiratory failure with hypoxia ([place]) 04/10/2014  . Wrist fracture 04/09/2014  . 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 T12 burst fracture ([place]) 01/13/2014  . Constipation 12/14/2013  . Acute respiratory failure with hypoxia ([place]) 12/14/2013  . COPD (chronic obstructive pulmonary disease) ([place]) 12/14/2013  . Compression fracture of thoracic vertebra ([place]) 12/14/2013  . Hypoxia 12/13/2013  . Compression fracture 12/13/2013  . 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 AAA (abdominal aortic aneurysm) without rupture ([place]) 02/26/2012  . Dyspepsia 02/11/2012  . Aneurysm ([place]) 02/11/2012  . Trigeminal neuralgia   . Epigastric pain 02/21/2011  . Dysphagia 02/21/2011  . Adenocarcinoma of lung ([place]) 12/07/2010  . HELICOBACTER PYLORI GASTRITIS, HX OF 12/30/2009  . Radiographic dye allergy status 12/30/2009  . Personal history of other diseases of the digestive system 12/30/2009    Past Surgical History:  Procedure Laterality Date  . ABDOMINAL HYSTERECTOMY    . APPLICATION OF CRANIAL NAVIGATION N/A 03/18/2019   Procedure: APPLICATION OF CRANIAL NAVIGATION;  Surgeon: Consuella Lose, MD;  Location: [place];  Service: Neurosurgery;  Laterality: N/A;  . BACK SURGERY  January 13, 2014  . BRAIN SURGERY N/A    Phreesia 03/28/2020  . CATARACT EXTRACTION    . CATARACT EXTRACTION W/PHACO  09/02/2012   Procedure: CATARACT EXTRACTION PHACO AND INTRAOCULAR LENS PLACEMENT (IOC);  Surgeon: Elta Guadeloupe T. Gershon Crane, MD;  Location: [facility];  Service: Ophthalmology;  Laterality: Left;  CDE:10.35  . CHOLECYSTECTOMY    . 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 COLONOSCOPY N/A 11/23/2014   Dr. Oneida Alar: moderate diverticulosis, hemorrhoids, redundant colon. next TCS in 10-15 years.   .  CRANIOTOMY Left 03/18/2019   Procedure: Left stereotactic craniotomy for tumor resection;  Surgeon: Consuella Lose, MD;  Location: [facility];  Service: Neurosurgery;  Laterality: Left;  Left stereotactic craniotomy for tumor resection  . ENDARTERECTOMY Right 08/21/2013   Procedure: RIGHT CAROTID ANEURYSM RESECTION;  Surgeon: Rosetta Posner, MD;  Location: [facility];  Service: Vascular;  Laterality: Right;  . ESOPHAGEAL DILATION  02/24/2018   Procedure: ESOPHAGEAL DILATION;  Surgeon: Danie Binder, MD;  [facility];  Service: Endoscopy;;  . ESOPHAGOGASTRODUODENOSCOPY  11/13/2009   w/dilation to 62mm, gastric ulceration (H.Pylori) s/p treatment  . ESOPHAGOGASTRODUODENOSCOPY  11/21/2009   distal esophageal web, gastritis  . ESOPHAGOGASTRODUODENOSCOPY  03/14/12   [id number] in the distal esophagus/Mild gastritis/small HH. + H.pylori, prescribed Pylera. Finished treatment.   . ESOPHAGOGASTRODUODENOSCOPY N/A 12/13/2017   Dr. Oneida Alar: esophageal web s/p dilation, gastritis, no h.pylori  . ESOPHAGOGASTRODUODENOSCOPY N/A 02/24/2018   Dr. Oneida Alar: web in prox esophagus and in distal esophagus s/p dilation, mild gastritis, mild pyloric stenosis, mild post ulcer duodenal deformity at D1/D2  . fatty tumor removal  from lt groin    . FRACTURE SURGERY Left    hand and left ring finger  . GIVENS CAPSULE STUDY N/A 12/26/2017   incomplete  . GIVENS CAPSULE STUDY N/A 02/24/2018   normal small bowel  . KNEE SURGERY     patella tendon repair june 2019 [facility]  . LUNG CANCER SURGERY  12/2010   Left VATS, minithoracotomy, LLL superior segmentectomy  . ORIF PATELLA Right 03/18/2018   Procedure: OPEN REDUCTION INTERNAL (ORIF) FIXATION RIGHT PATELLA;  Surgeon: Carole Civil, MD;  Location: [facility];  Service: Orthopedics;  Laterality: Right;  . ORIF WRIST FRACTURE Left 04/09/2014   Procedure: OPEN REDUCTION INTERNAL FIXATION (ORIF) WRIST FRACTURE;  Surgeon: Renette Butters, MD;  Location: [facility];  Service:  Orthopedics;  Laterality: Left;  . PERCUTANEOUS PINNING Left 04/09/2014   Procedure: PERCUTANEOUS PINNING EXTREMITY;  Surgeon: Renette Butters, MD;  Location: [facility];  Service: Orthopedics;  Laterality: Left;  . SAVORY DILATION N/A 12/13/2017   Procedure: SAVORY DILATION;  Surgeon: Danie Binder, MD;  [facility];  Service: Endoscopy;  Laterality: N/A;  . TUBAL LIGATION    . vocal cord surgery  02/06/2011   laryngoscopy with bilateral vocal cord Radiesse injection for vocal cord paralysis  . YAG LASER APPLICATION Left 37/07/6268   Procedure: YAG LASER APPLICATION;  Surgeon: Rutherford Guys, MD;  Location: [facility];  Service: Ophthalmology;  Laterality: Left;     OB History    Gravida  2   Para  2   Term  2   Preterm      AB      Living  2     SAB      TAB      Ectopic      Multiple      Live Births              Family History  Problem Relation Age of Onset  . Pulmonary embolism Mother   . Heart attack Father   . Hypertension Father   . Liver cancer Sister   . Cancer Sister   . Cancer Brother   . Cancer Daughter   . Colon cancer Neg Hx     Social History   Tobacco Use  . Smoking status: Former Smoker    Packs/day: 0.50    Years: 20.00    Pack years: 10.00    Types: Cigarettes    Quit date: 12/21/2010    Years since quitting: 9.2  . Smokeless tobacco: Never Used  . Tobacco comment: smoking cessation info given and reviewed   Vaping Use  . Vaping Use: Never used  Substance Use Topics  . Alcohol use: Not Currently    Alcohol/week: 0.0 standard drinks    Comment: seldom  . Drug use: No    Home Medications Prior to Admission medications   Medication Sig Start Date End Date Taking? Authorizing Provider  albuterol (VENTOLIN HFA) 108 (90 Base) MCG/ACT inhaler Inhale 2 puffs into the lungs every 6 (six) hours as needed for wheezing or shortness of breath. Patient not taking: Reported on 03/15/2020 05/14/19   [name], MD  ALPRAZolam [facility]anne Moron)  0.5 MG tablet TAKE (1) TABLET BY MOUTH TWICE A DAY AS NEEDED. Patient taking differently: at bedtime.  11/10/19   [name], MD  amoxicillin (AMOXIL) 875 MG tablet Take 1 tablet (875 mg total) by mouth 2 (two) times daily. 03/22/20   [name], MD  azithromycin [facility])  250 MG tablet Take 1 tablet (250 mg total) by mouth daily. Take first 2 tablets together, then 1 every day until finished. 03/30/20   Eustaquio Maize, PA-C  citalopram (CELEXA) 40 MG tablet TAKE (1/2) TABLET BY MOUTH AT BEDTIME. 12/01/19   [name], MD  denosumab (PROLIA) 60 MG/ML SOSY injection Inject 60 mg into the skin every 6 (six) months. 12/03/19   [name], MD  diphenhydrAMINE (BENADRYL) 50 MG tablet Take 1 tablet (50 mg total) by mouth as directed for 1 dose. Take Benadryl 50 mg 1 hour before your CT scan. Patient not taking: Reported on 03/15/2020 03/10/20   Derek Jack, MD  ferrous sulfate 325 (65 FE) MG tablet Take 325 mg by mouth daily with breakfast.    [provider]  fluticasone (FLONASE) 50 MCG/ACT nasal spray INSTILL 2 SPRAYS INTO BOTH NOSTRILS DAILY. 02/29/20   [name], MD  levocetirizine (XYZAL) 5 MG tablet TAKE ONE TABLET BY MOUTH ONCE DAILY. 08/10/19   [name], MD  levothyroxine (SYNTHROID) 50 MCG tablet TAKE 1 TABLET BEFORE BREAKFAST. 03/16/20   [name], MD  linaclotide [facility]) 145 MCG CAPS capsule Take 1 capsule (145 mcg total) by mouth daily before breakfast. 01/13/20   Mahala Menghini, PA-C  Multiple Vitamin (MULTIVITAMIN WITH MINERALS) TABS tablet Take 1 tablet by mouth daily.    [provider]  OVER THE COUNTER MEDICATION Bone Support 2-4 tabs daily    [provider]  OXcarbazepine (TRILEPTAL) 150 MG tablet TAKE (1) TABLET BY MOUTH TWICE DAILY. 02/26/20   [name], MD  pantoprazole (PROTONIX) 40 MG tablet TAKE (1) TABLET BY MOUTH ONCE DAILY BEFORE BREAKFAST. 11/18/19   Annitta Needs, NP  predniSONE (STERAPRED  UNI-PAK 21 TAB) 10 MG (21) TBPK tablet Follow package insert 03/30/20   Alroy Bailiff, Ezinne Yogi, PA-C  Probiotic Product (ALIGN PO) Take by mouth daily.    [provider]  tiZANidine (ZANAFLEX) 4 MG tablet TAKE (1) TABLET BY MOUTH EVERY SIX HOURS AS NEEDED FOR MUSCLE SPASMS. Patient not taking: Reported on 03/15/2020 01/19/20   [name], MD  TRELEGY ELLIPTA 100-62.5-25 MCG/INH AEPB INHALE 1 PUFF INTO LUNGS DAILY. 12/01/19   [name], MD    Allergies    Ciprofloxacin hcl, Sulfonamide derivatives, Iohexol, Iodinated diagnostic agents, and Prozac [fluoxetine hcl]  Review of Systems   Review of Systems  Constitutional: Positive for fatigue. Negative for chills and fever.  HENT: Positive for sore throat.   Respiratory: Positive for cough and shortness of breath.   Cardiovascular: Negative for chest pain.  Gastrointestinal: Negative for abdominal pain, nausea and vomiting.  All other systems reviewed and are negative.   Physical Exam Updated Vital Signs BP 119/80 (BP Location: Left Arm)   Pulse 89   Temp 98.7 F (37.1 C) (Oral)   Resp 20   Ht 5\' 4"  (1.626 m)   Wt 52.6 kg   SpO2 (!) 89%   BMI 19.91 kg/m   Physical Exam Vitals and nursing note reviewed.  Constitutional:      Appearance: She is not ill-appearing.  HENT:     Head: Normocephalic and atraumatic.  Eyes:     Conjunctiva/sclera: Conjunctivae normal.  Cardiovascular:     Rate and Rhythm: Normal rate and regular rhythm.  Pulmonary:     Effort: Pulmonary effort is normal.     Breath sounds: Normal breath sounds.  Abdominal:     Palpations: Abdomen is soft.  Tenderness: There is no abdominal tenderness.  Musculoskeletal:     Cervical back: Neck supple.  Skin:    General: Skin is warm and dry.  Neurological:     Mental Status: She is alert.     ED Results / Procedures / Treatments   Labs (all labs ordered are listed, but only abnormal results are displayed) Labs Reviewed  BASIC METABOLIC  PANEL - Abnormal; Notable for the following components:      Result Value   Sodium 134 (*)    Chloride 92 (*)    Glucose, Bld 112 (*)    All other components within normal limits  SARS CORONAVIRUS 2 BY RT PCR (HOSPITAL ORDER, [facility])  CBC WITH DIFFERENTIAL/PLATELET    EKG None  Radiology DG Chest Portable 1 View  Result Date: 03/30/2020 CLINICAL DATA:  Cough EXAM: PORTABLE CHEST 1 VIEW COMPARISON:  03/25/2020 FINDINGS: Emphysema with chronic interstitial prominence. Stable atelectasis/scarring at the lung bases. No significant pleural effusion. Normal heart size. Partially imaged thoracolumbar fusion. IMPRESSION: No acute process in the chest. Electronically Signed   By: Macy Mis M.D.   On: 03/30/2020 12:26    Procedures Procedures (including critical care time)  Medications Ordered in ED Medications  predniSONE (DELTASONE) tablet 60 mg (has no administration in time range)  ipratropium-albuterol (DUONEB) 0.5-2.5 (3) MG/3ML nebulizer solution 3 mL (3 mLs Nebulization Given 03/30/20 1523)  ondansetron (ZOFRAN) injection 4 mg (4 mg Intravenous Given 03/30/20 1556)    ED Course  I have reviewed the triage vital signs and the nursing notes.  Pertinent labs & imaging results that were available during my care of the patient were reviewed by me and considered in my medical decision making (see chart for details).    MDM Rules/Calculators/A&P                          79 year old female with a history of adenocarcinoma of the left lung with metastasis to the brain who presents to the ED today with productive cough for the last 1.5 weeks with associated shortness of breath.  Was started empirically on amoxicillin doing telemedicine visit on 6/15 without improvement.  He has not been vaccinated for Covid.  On arrival to the ED patient is afebrile, nontachycardic and nontachypneic.  Her initial O2 saturation was documented to 89% on her chronic 2 L home  O2.  However with recheck once patient was placed on the monitor it was 99% on 2 L.  I suspect that may have been an error in documentation.  Patient states she has not had to increase her oxygen saturation at home during this past 1.5 weeks.  She states that her cough is somewhat improved however she continues to feel increasingly fatigued.  Here for further evaluation.  Will work-up with chest x-ray at this time and screening labs.  We will plan to swab for Covid given patient has not been vaccinated.   Chest x-ray without any acute findings at this time. COVID negative.  Remainder of labs reassuring.  No leukocytosis to suggest infectious process at this time.   On reassessment pt continues to cough and feel generally fatigued. She has been ambulated with her O2 on 2L; 90-92% with ambulation however when she was brought back to the room she quickly went down to 87% and gradually returned to about 90%. Difficult to say what is causing pt's symptoms. Suspect URI at this time however no  improvement with abx at home. She does have a hx of COPD - no obvious wheezing on exam however will provide neb treatment given COVID test negative as well as prednisone. I have discussed with pt admission vs discharge home and she would feel more comfortable being admitted at this time given she lives alone and has no support system. I have discussed this with attending physician Dr. Alvino Chapel who thinks this is reasonable. Will call hospitalist for admission.   Discussed case with [facility] Dr. Waldron Labs who evaluated patient; no real medical necessity for patient to be admitted. She is in agreeance to go home. Will discharge with azithromycin and steroid taper. Pt to follow up with PCP. Advised to return to the ED for any worsening symptoms.   This note was prepared using Dragon voice recognition software and may include unintentional dictation errors due to the inherent limitations of voice recognition  software.  Final Clinical Impression(s) / ED Diagnoses Final diagnoses:  SOB (shortness of breath)  Hypoxia    Rx / DC Orders ED Discharge Orders         Ordered    azithromycin (ZITHROMAX) 250 MG tablet  Daily     Discontinue  Reprint     03/30/20 1557    predniSONE (STERAPRED UNI-PAK 21 TAB) 10 MG (21) TBPK tablet     Discontinue  Reprint     03/30/20 1557           Eustaquio Maize, PA-C 03/30/20 1559    Davonna Belling, MD 03/30/20 [phone]

## 2020-03-30 NOTE — ED Triage Notes (Signed)
Pt reports was diagnosed with respiratory infection last week and is on antibiotics and started taking something otc yesterday.  Reports today feels like congestion is better but c/o generalized weakness today.  Pt says her HR goes up and o2sat decreases when she stands.  Pt on home o2 at 2liters continuously.    Reports has not been tested for covid

## 2020-04-01 ENCOUNTER — Other Ambulatory Visit: Payer: PPO

## 2020-04-01 DIAGNOSIS — C3490 Malignant neoplasm of unspecified part of unspecified bronchus or lung: Secondary | ICD-10-CM | POA: Diagnosis not present

## 2020-04-01 DIAGNOSIS — R0902 Hypoxemia: Secondary | ICD-10-CM | POA: Diagnosis not present

## 2020-04-01 DIAGNOSIS — J449 Chronic obstructive pulmonary disease, unspecified: Secondary | ICD-10-CM | POA: Diagnosis not present

## 2020-04-01 DIAGNOSIS — J9621 Acute and chronic respiratory failure with hypoxia: Secondary | ICD-10-CM | POA: Diagnosis not present

## 2020-04-04 ENCOUNTER — Ambulatory Visit: Payer: PPO | Admitting: Internal Medicine

## 2020-04-04 NOTE — Telephone Encounter (Signed)
I will accept the patient into the practice.  Please make her a new patient appointment with Judson Roch.

## 2020-04-08 DIAGNOSIS — Z961 Presence of intraocular lens: Secondary | ICD-10-CM | POA: Diagnosis not present

## 2020-04-08 DIAGNOSIS — H524 Presbyopia: Secondary | ICD-10-CM | POA: Diagnosis not present

## 2020-04-08 DIAGNOSIS — H5213 Myopia, bilateral: Secondary | ICD-10-CM | POA: Diagnosis not present

## 2020-04-13 ENCOUNTER — Ambulatory Visit (INDEPENDENT_AMBULATORY_CARE_PROVIDER_SITE_OTHER): Payer: PPO | Admitting: Nurse Practitioner

## 2020-04-14 ENCOUNTER — Ambulatory Visit (INDEPENDENT_AMBULATORY_CARE_PROVIDER_SITE_OTHER): Payer: PPO | Admitting: Nurse Practitioner

## 2020-04-14 ENCOUNTER — Other Ambulatory Visit: Payer: Self-pay | Admitting: Family Medicine

## 2020-04-14 MED ORDER — PROLIA 60 MG/ML ~~LOC~~ SOSY: 60.0000 mg | PREFILLED_SYRINGE | SUBCUTANEOUS | 0 refills | Status: DC

## 2020-04-15 ENCOUNTER — Other Ambulatory Visit: Payer: Self-pay | Admitting: Family Medicine

## 2020-04-15 NOTE — Telephone Encounter (Signed)
Ok to refill??  Last office visit 03/22/2020.  Last refill 11/10/2019, #3 refills.

## 2020-04-18 ENCOUNTER — Encounter (INDEPENDENT_AMBULATORY_CARE_PROVIDER_SITE_OTHER): Payer: Self-pay | Admitting: Internal Medicine

## 2020-04-18 ENCOUNTER — Other Ambulatory Visit: Payer: Self-pay

## 2020-04-18 ENCOUNTER — Ambulatory Visit (INDEPENDENT_AMBULATORY_CARE_PROVIDER_SITE_OTHER): Payer: PPO | Admitting: Internal Medicine

## 2020-04-18 VITALS — BP 125/80 | HR 40 | Temp 97.1°F | Ht 60.0 in | Wt 110.2 lb

## 2020-04-18 DIAGNOSIS — E039 Hypothyroidism, unspecified: Secondary | ICD-10-CM | POA: Diagnosis not present

## 2020-04-18 DIAGNOSIS — C3432 Malignant neoplasm of lower lobe, left bronchus or lung: Secondary | ICD-10-CM | POA: Diagnosis not present

## 2020-04-18 DIAGNOSIS — M810 Age-related osteoporosis without current pathological fracture: Secondary | ICD-10-CM

## 2020-04-18 NOTE — Progress Notes (Signed)
Metrics: Intervention Frequency ACO  Documented Smoking Status Yearly  Screened one or more times in 24 months  Cessation Counseling or  Active cessation medication Past 24 months  Past 24 months   Guideline developer: UpToDate (See UpToDate for funding source) Date Released: 2014       Wellness Office Visit  Subjective:  Patient ID: [name], female    DOB: 03/28/1941  Age: 79 y.o. MRN: [id number]  CC: This 79 year old lady comes to our practice to establish care.  She does have a physician within [facility] health system but transportation is an issue and she is able to come to our practice. 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 She also has osteoporosis. She also has hypothyroidism and takes levothyroxine. She had blood work done about a month ago and these numbers are all reasonable. She says that she lives alone and is fully functional but she does get Meals on Wheels now. Past Medical History:  Diagnosis Date  . 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 Allergic rhinitis   . 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 Anxiety disorder   . Aortic aneurysm ([place])   . Brain metastasis ([place])    lung cancer s/p resection 7/20  . Complication of anesthesia 2011   bloodpressure dropped during colonoscopy, not problems since  . Compression fracture   . COPD (chronic obstructive pulmonary disease) ([place])   . Depression   . Diastolic dysfunction   . Diverticulosis   . Dysphagia   . Emphysema lung ([place]) 11/08/2014  . Headache   . Hiatal hernia   . History of kidney stones   . Hypothyroidism   . IBS (irritable bowel syndrome)   . Iron deficiency anemia due to chronic blood loss 12/05/2016  . On home O2 12/15/2013   chronic hypoxia  . Pneumonia   . PUD (peptic ulcer disease)    8yrs  .  Shortness of breath    with exertion  . SIADH (syndrome of inappropriate ADH production) ([place])   . Trigeminal neuralgia    Past Surgical History:  Procedure Laterality Date  . ABDOMINAL HYSTERECTOMY    . APPLICATION OF CRANIAL NAVIGATION N/A 03/18/2019   Procedure: APPLICATION OF CRANIAL NAVIGATION;  Surgeon: Consuella Lose, MD;  Location: [place];  Service: Neurosurgery;  Laterality: N/A;  . BACK SURGERY  January 13, 2014  . BRAIN SURGERY N/A    Phreesia 03/28/2020  . CATARACT EXTRACTION    . CATARACT EXTRACTION W/PHACO  09/02/2012   Procedure: CATARACT EXTRACTION PHACO AND INTRAOCULAR LENS PLACEMENT (IOC);  Surgeon: Elta Guadeloupe T. Gershon Crane, MD;  Location: [facility];  Service: Ophthalmology;  Laterality: Left;  CDE:10.35  . CHOLECYSTECTOMY    . 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 COLONOSCOPY N/A 11/23/2014   Dr. Oneida Alar: moderate diverticulosis, hemorrhoids, redundant colon. next TCS in 10-15 years.   . CRANIOTOMY Left 03/18/2019   Procedure: Left stereotactic craniotomy for tumor resection;  Surgeon: Consuella Lose, MD;  Location: [place];  Service: Neurosurgery;  Laterality: Left;  Left stereotactic craniotomy for tumor resection  . ENDARTERECTOMY Right 08/21/2013   Procedure: RIGHT CAROTID ANEURYSM RESECTION;  Surgeon: Rosetta Posner, MD;  Location: [place];  Service: Vascular;  Laterality: Right;  . ESOPHAGEAL DILATION  02/24/2018   Procedure: ESOPHAGEAL DILATION;  Surgeon: Danie Binder, MD;  [facility];  Service: Endoscopy;;  . ESOPHAGOGASTRODUODENOSCOPY  11/13/2009   w/dilation to 42mm, gastric ulceration (H.Pylori) s/p treatment  . ESOPHAGOGASTRODUODENOSCOPY  11/21/2009   distal esophageal web, gastritis  . ESOPHAGOGASTRODUODENOSCOPY  03/14/12   OZY:YQMGNOIBB in the distal esophagus/Mild gastritis/small HH. + H.pylori, prescribed Pylera. Finished treatment.   . ESOPHAGOGASTRODUODENOSCOPY N/A 12/13/2017   Dr. Oneida Alar: esophageal web s/p  dilation, gastritis, no h.pylori  . ESOPHAGOGASTRODUODENOSCOPY N/A 02/24/2018   Dr. Oneida Alar: web in prox esophagus and in distal esophagus s/p dilation, mild gastritis, mild pyloric stenosis, mild post ulcer duodenal deformity at D1/D2  . fatty tumor removal from lt groin    . FRACTURE SURGERY Left    hand and left ring finger  . GIVENS CAPSULE STUDY N/A 12/26/2017   incomplete  . GIVENS CAPSULE STUDY N/A 02/24/2018   normal small bowel  . KNEE SURGERY     patella tendon repair june 2019 [place]  . LUNG CANCER SURGERY  12/2010   Left VATS, minithoracotomy, LLL superior segmentectomy  . ORIF PATELLA Right 03/18/2018   Procedure: OPEN REDUCTION INTERNAL (ORIF) FIXATION RIGHT PATELLA;  Surgeon: Carole Civil, MD;  Location: [facility];  Service: Orthopedics;  Laterality: Right;  . ORIF WRIST FRACTURE Left 04/09/2014   Procedure: OPEN REDUCTION INTERNAL FIXATION (ORIF) WRIST FRACTURE;  Surgeon: Renette Butters, MD;  Location: [place];  Service: Orthopedics;  Laterality: Left;  . PERCUTANEOUS PINNING Left 04/09/2014   Procedure: PERCUTANEOUS PINNING EXTREMITY;  Surgeon: Renette Butters, MD;  Location: [place];  Service: Orthopedics;  Laterality: Left;  . SAVORY DILATION N/A 12/13/2017   Procedure: SAVORY DILATION;  Surgeon: Danie Binder, MD;  [facility];  Service: Endoscopy;  Laterality: N/A;  . TUBAL LIGATION    . vocal cord surgery  02/06/2011   laryngoscopy with bilateral vocal cord Radiesse injection for vocal cord paralysis  . YAG LASER APPLICATION Left 04/88/8916   Procedure: YAG LASER APPLICATION;  Surgeon: Rutherford Guys, MD;  Location: [facility];  Service: Ophthalmology;  Laterality: Left;     Family History  Problem Relation Age of Onset  . Pulmonary embolism Mother   . Heart attack Father   . Hypertension Father   . Liver cancer Sister   . Cancer Sister   . Cancer Brother   . Cancer Daughter   . Breast cancer Daughter   . Colon cancer Neg Hx     Social History    Social History Narrative   Divorced since 1991.Lives alone with a dog.Marland KitchenRetired.  She receives Meals on Wheels.   Social History   Tobacco Use  . Smoking status: Former Smoker    Packs/day: 0.50    Years: 20.00    Pack years: 10.00    Types: Cigarettes    Quit date: 12/21/2010    Years since quitting: 9.3  . Smokeless tobacco: Never Used  . Tobacco comment: smoking cessation info given and reviewed   Substance Use Topics  . Alcohol use: Not Currently    Alcohol/week: 0.0 standard drinks    Current Meds  Medication Sig  . albuterol (VENTOLIN HFA) 108 (90 Base) MCG/ACT inhaler Inhale 2 puffs into the lungs every 6 (six) hours as needed for wheezing or shortness of breath.  . ALPRAZolam (XANAX) 0.5 MG tablet TAKE (1) TABLET BY MOUTH TWICE A DAY AS NEEDED.  . citalopram (CELEXA) 40 MG tablet TAKE (1/2) TABLET BY MOUTH  AT BEDTIME.  Marland Kitchen denosumab (PROLIA) 60 MG/ML SOSY injection Inject 60 mg into the skin every 6 (six) months.  . ferrous sulfate 325 (65 FE) MG tablet Take 325 mg by mouth daily with breakfast.  . fluticasone (FLONASE) 50 MCG/ACT nasal spray INSTILL 2 SPRAYS INTO BOTH NOSTRILS DAILY.  Marland Kitchen levocetirizine (XYZAL) 5 MG tablet TAKE ONE TABLET BY MOUTH ONCE DAILY.  Marland Kitchen levothyroxine (SYNTHROID) 50 MCG tablet TAKE 1 TABLET BEFORE BREAKFAST.  . Multiple Vitamin (MULTIVITAMIN WITH MINERALS) TABS tablet Take 1 tablet by mouth daily.  . ondansetron (ZOFRAN) 4 MG tablet TAKE 1 TABLET BY MOUTH 3 TIMES DAILY AS NEEDED FOR NAUSEA AND VOMITING.  . OVER THE COUNTER MEDICATION Bone Support 2-4 tabs daily  . OXcarbazepine (TRILEPTAL) 150 MG tablet TAKE (1) TABLET BY MOUTH TWICE DAILY.  . pantoprazole (PROTONIX) 40 MG tablet TAKE (1) TABLET BY MOUTH ONCE DAILY BEFORE BREAKFAST.  Marland Kitchen Probiotic Product (ALIGN PO) Take by mouth daily.  . TRELEGY ELLIPTA 100-62.5-25 MCG/INH AEPB INHALE 1 PUFF INTO LUNGS DAILY.       Depression screen [facility] 2/9 04/27/2019 04/30/2018  Decreased Interest 0 1  Down,  Depressed, Hopeless 0 1  PHQ - 2 Score 0 2  Altered sleeping - 1  Tired, decreased energy - 1  Change in appetite - 1  Feeling bad or failure about yourself  - 0  Trouble concentrating - 1  Moving slowly or fidgety/restless - 0  Suicidal thoughts - 0  PHQ-9 Score - 6  Difficult doing work/chores - Somewhat difficult  Some recent data might be hidden     Objective:   Today's Vitals: BP 125/80 (BP Location: Left Arm, Patient Position: Sitting, Cuff Size: Normal)   Pulse (!) 40   Temp (!) 97.1 F (36.2 C) (Temporal)   Ht 5' (1.524 m)   Wt 110 lb 3.2 oz (50 kg)   SpO2 (!) 89%   BMI 21.52 kg/m  Vitals with BMI 04/18/2020 03/30/2020 03/30/2020  Height 5\' 0"  - -  Weight 110 lbs 3 oz - -  BMI 56.81 - -  Systolic 275 170 017  Diastolic 80 80 74  Pulse 40 102 96     Physical Exam   She looks systemically well.  Vital signs are stable.    Assessment   1. Age-related osteoporosis without current pathological fracture   2. Primary cancer of left lower lobe of lung ([place])   3. Hypothyroidism, adult       Tests ordered No orders of the defined types were placed in this encounter.    Plan: 1. As far as her osteoporosis concerned, I am not sure she is a candidate for any significant medications for this at the present time.  With her diagnosis of lung cancer which is metastatic, I do not think this would be of much use at this point in her life. 2. As far as her hypothyroidism is concerned, she will continue with the present dose of levothyroxine.  Her TSH has been checked but I do not see a T3 or T4 levels being checked.  We should check these next time. 3. Her metastatic lung cancer is being treated by oncology.  I will defer to them. 4. Follow-up in about 3 months with Judson Roch.   No orders of the defined types were placed in this encounter.   Doree Albee, MD

## 2020-04-19 ENCOUNTER — Encounter (INDEPENDENT_AMBULATORY_CARE_PROVIDER_SITE_OTHER): Payer: Self-pay | Admitting: Internal Medicine

## 2020-04-20 ENCOUNTER — Encounter: Payer: Self-pay | Admitting: Family Medicine

## 2020-04-21 NOTE — Telephone Encounter (Signed)
See my ov 11/27/19 See sandy's note 11/30/19 What do I/we need to do?

## 2020-04-22 ENCOUNTER — Other Ambulatory Visit: Payer: Self-pay | Admitting: Family Medicine

## 2020-04-27 IMAGING — DX DG ABDOMEN ACUTE W/ 1V CHEST
3 series · 3 of 3 positions shown · non-contrast
Comparison: CT 05/06/2018, radiograph 01/03/2018

CLINICAL DATA: Weakness diarrhea

EXAM:
DG ABDOMEN ACUTE W/ 1V CHEST

[chest pa]
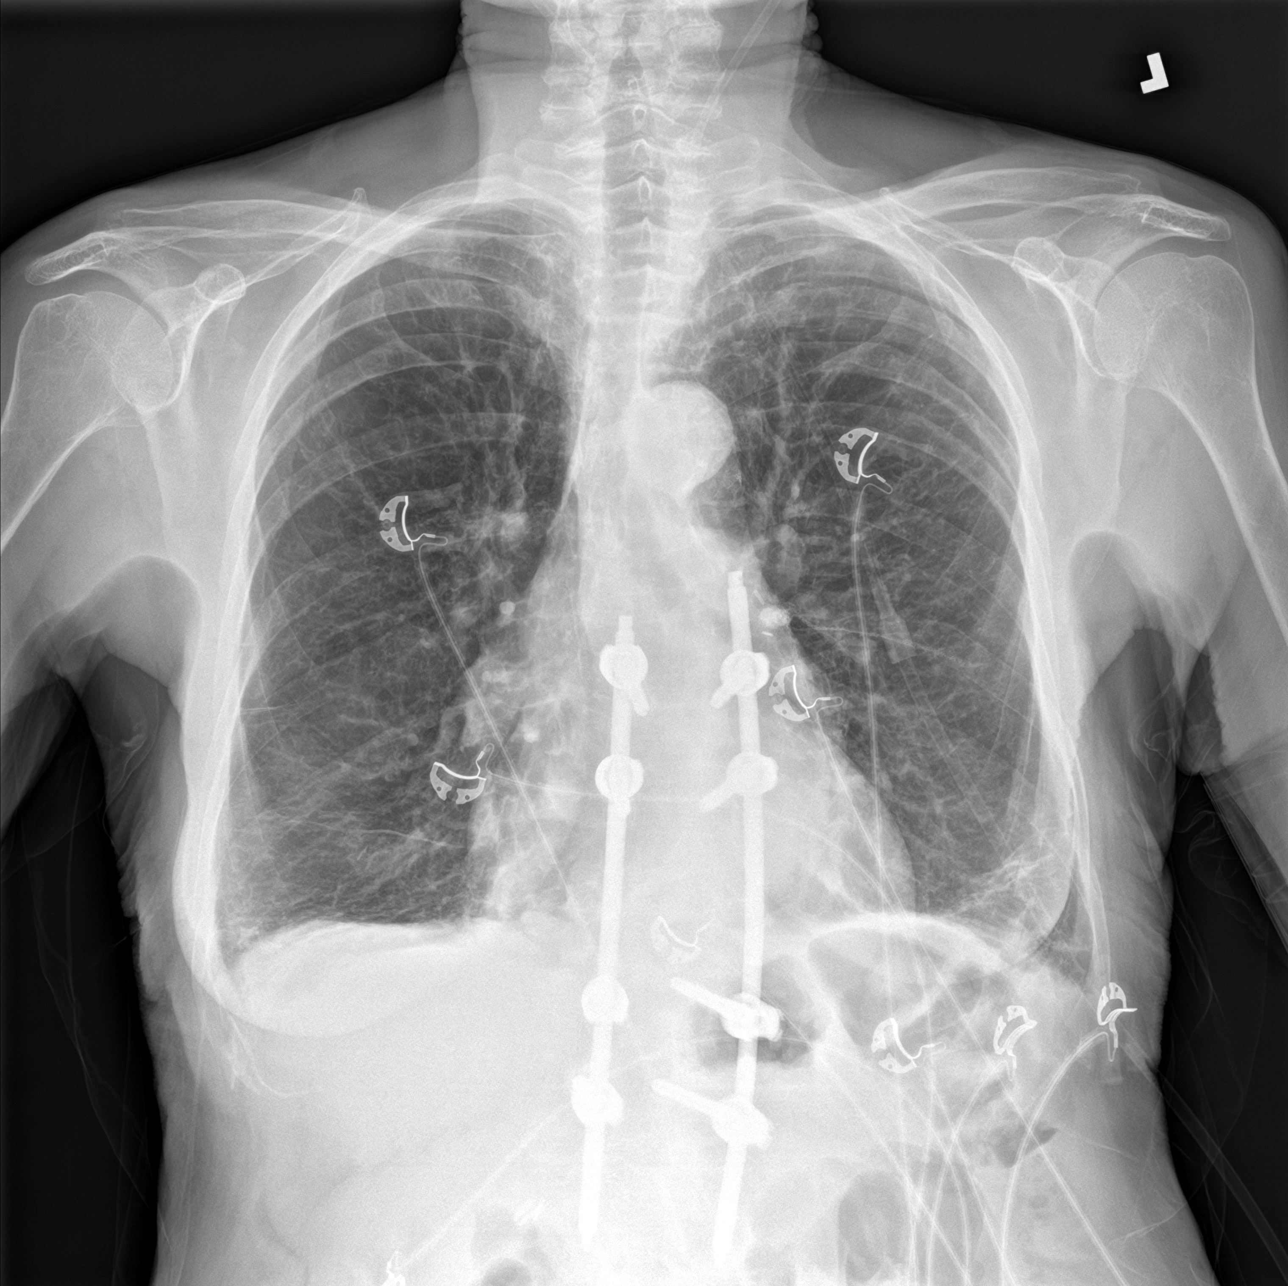

[abdomen erect]
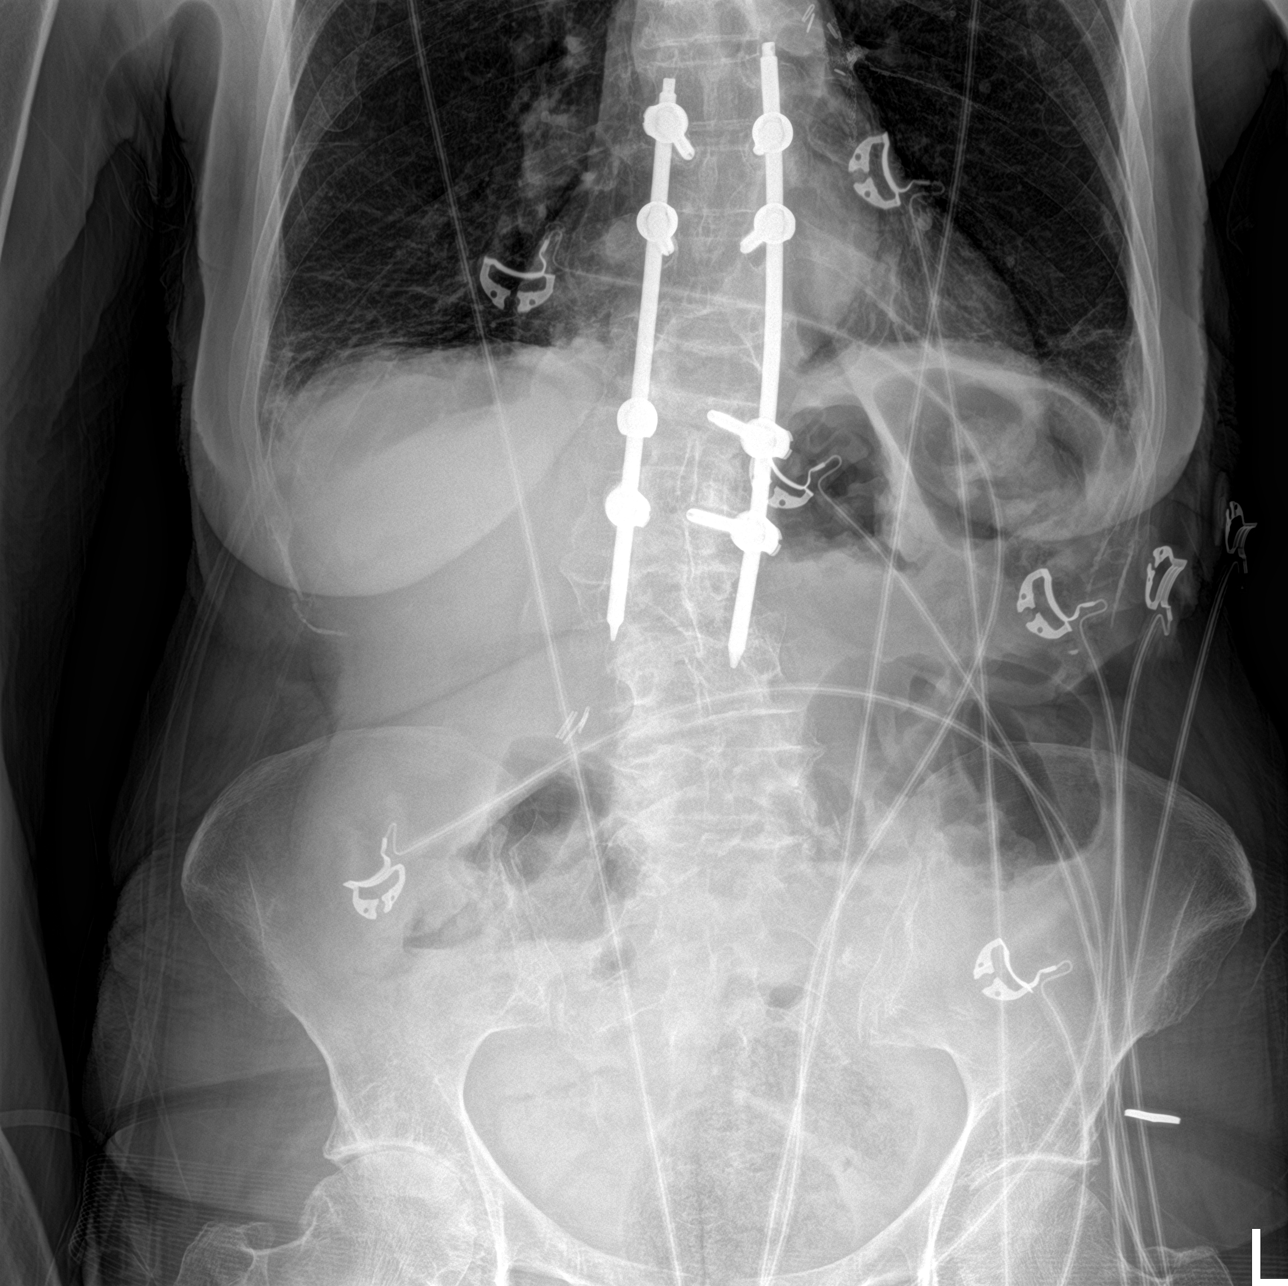

[abdomen supine]
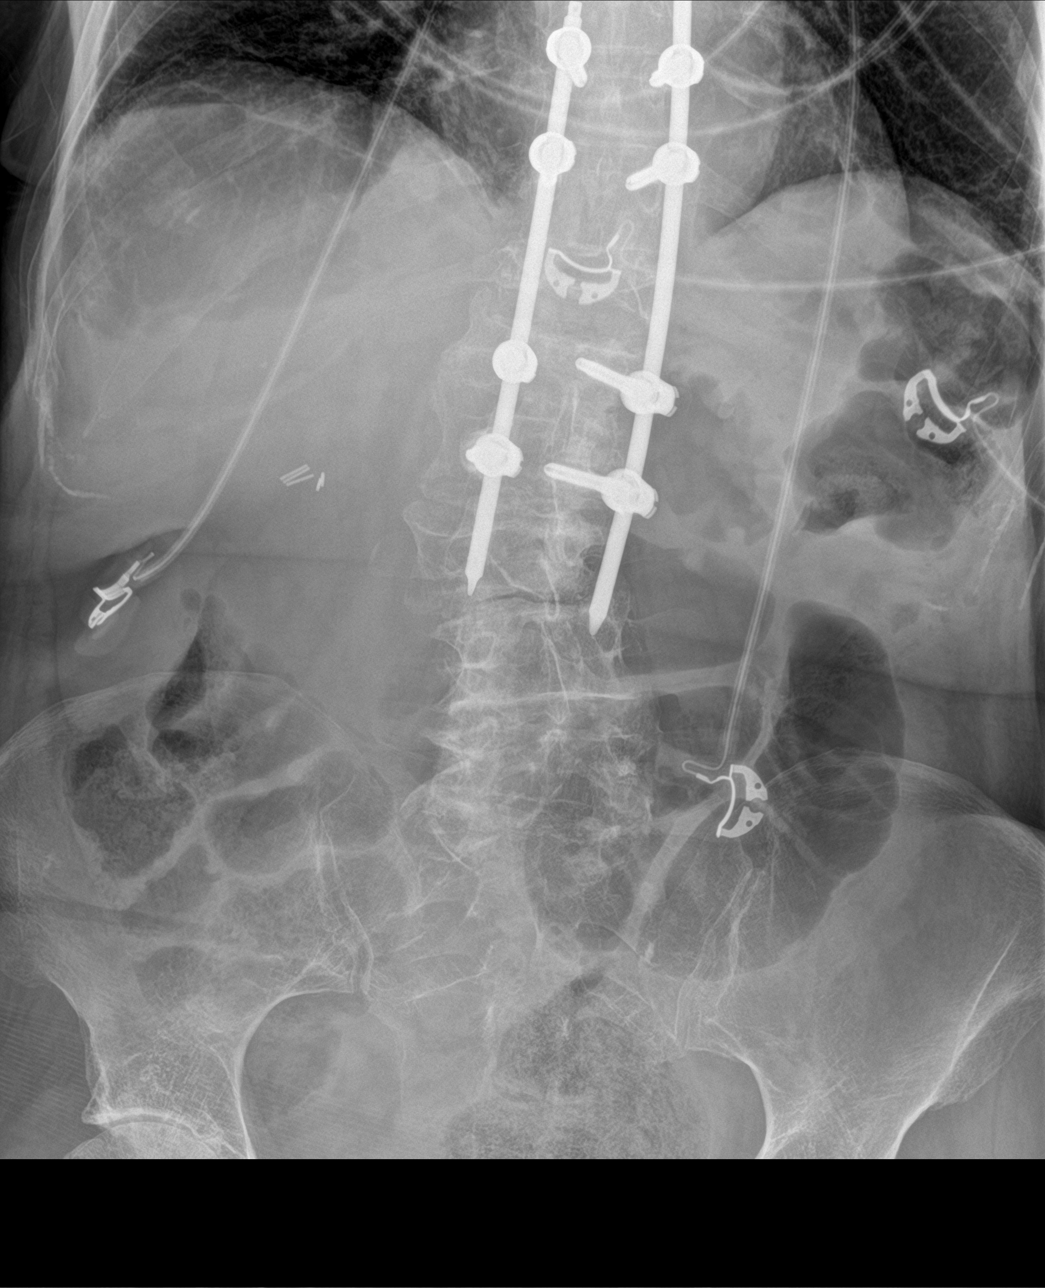

[3 of 3 positions shown; findings below may reference images not displayed]

FINDINGS: Single-view chest demonstrates bibasilar scarring. No acute opacity
or significant pleural effusion. Stable mild cardiomegaly. No
pneumothorax. Emphysematous disease.

Supine and upright views of the abdomen demonstrate posterior rods
and fixating screws in the thoracolumbar spine. No free air beneath
the diaphragm. Mild gaseous prominence of colon with moderate fecal
impaction at the rectum. No dilated small bowel. Surgical clips in
the right upper quadrant.
IMPRESSION: 1. Emphysematous disease with bibasilar scarring.  Mild cardiomegaly
2. Mild gaseous dilatation of colon without obstructive pattern.
Moderate formed feces at the rectum.

## 2020-04-29 ENCOUNTER — Encounter: Payer: Self-pay | Admitting: Family Medicine

## 2020-04-30 ENCOUNTER — Ambulatory Visit
Admission: RE | Admit: 2020-04-30 | Discharge: 2020-04-30 | Disposition: A | Discharge status: Home / Self Care | Payer: PPO | Source: Ambulatory Visit | Attending: Internal Medicine | Admitting: Internal Medicine

## 2020-04-30 DIAGNOSIS — C7931 Secondary malignant neoplasm of brain: Secondary | ICD-10-CM | POA: Diagnosis not present

## 2020-04-30 DIAGNOSIS — G9389 Other specified disorders of brain: Secondary | ICD-10-CM | POA: Diagnosis not present

## 2020-04-30 DIAGNOSIS — I671 Cerebral aneurysm, nonruptured: Secondary | ICD-10-CM | POA: Diagnosis not present

## 2020-04-30 DIAGNOSIS — G319 Degenerative disease of nervous system, unspecified: Secondary | ICD-10-CM | POA: Diagnosis not present

## 2020-04-30 MED ORDER — GADOBENATE DIMEGLUMINE 529 MG/ML IV SOLN
10.0000 mL | Freq: Once | INTRAVENOUS | Status: AC | PRN
  Administered 2020-04-30: 10 mL via INTRAVENOUS

## 2020-05-01 DIAGNOSIS — R0902 Hypoxemia: Secondary | ICD-10-CM | POA: Diagnosis not present

## 2020-05-01 DIAGNOSIS — C3490 Malignant neoplasm of unspecified part of unspecified bronchus or lung: Secondary | ICD-10-CM | POA: Diagnosis not present

## 2020-05-01 DIAGNOSIS — J9621 Acute and chronic respiratory failure with hypoxia: Secondary | ICD-10-CM | POA: Diagnosis not present

## 2020-05-01 DIAGNOSIS — J449 Chronic obstructive pulmonary disease, unspecified: Secondary | ICD-10-CM | POA: Diagnosis not present

## 2020-05-02 ENCOUNTER — Other Ambulatory Visit: Payer: PPO

## 2020-05-05 ENCOUNTER — Inpatient Hospital Stay: Payer: PPO | Attending: Internal Medicine | Admitting: Internal Medicine

## 2020-05-05 ENCOUNTER — Other Ambulatory Visit: Payer: Self-pay

## 2020-05-05 VITALS — BP 114/71 | HR 69 | Temp 97.6°F | Resp 17 | Ht 60.0 in | Wt 110.0 lb

## 2020-05-05 DIAGNOSIS — Z809 Family history of malignant neoplasm, unspecified: Secondary | ICD-10-CM | POA: Diagnosis not present

## 2020-05-05 DIAGNOSIS — Z87891 Personal history of nicotine dependence: Secondary | ICD-10-CM | POA: Diagnosis not present

## 2020-05-05 DIAGNOSIS — C3432 Malignant neoplasm of lower lobe, left bronchus or lung: Secondary | ICD-10-CM | POA: Diagnosis not present

## 2020-05-05 DIAGNOSIS — Z836 Family history of other diseases of the respiratory system: Secondary | ICD-10-CM | POA: Diagnosis not present

## 2020-05-05 DIAGNOSIS — Z79899 Other long term (current) drug therapy: Secondary | ICD-10-CM | POA: Diagnosis not present

## 2020-05-05 DIAGNOSIS — E785 Hyperlipidemia, unspecified: Secondary | ICD-10-CM | POA: Diagnosis not present

## 2020-05-05 DIAGNOSIS — Z800 Family history of malignant neoplasm of digestive organs: Secondary | ICD-10-CM | POA: Insufficient documentation

## 2020-05-05 DIAGNOSIS — Z803 Family history of malignant neoplasm of breast: Secondary | ICD-10-CM | POA: Insufficient documentation

## 2020-05-05 DIAGNOSIS — J449 Chronic obstructive pulmonary disease, unspecified: Secondary | ICD-10-CM | POA: Diagnosis not present

## 2020-05-05 DIAGNOSIS — C7931 Secondary malignant neoplasm of brain: Secondary | ICD-10-CM | POA: Diagnosis not present

## 2020-05-05 DIAGNOSIS — K589 Irritable bowel syndrome without diarrhea: Secondary | ICD-10-CM | POA: Diagnosis not present

## 2020-05-05 DIAGNOSIS — I10 Essential (primary) hypertension: Secondary | ICD-10-CM | POA: Diagnosis not present

## 2020-05-05 DIAGNOSIS — Z8249 Family history of ischemic heart disease and other diseases of the circulatory system: Secondary | ICD-10-CM | POA: Insufficient documentation

## 2020-05-05 NOTE — Progress Notes (Signed)
[facility] [place], [place] [address] [phone]  Interval Evaluation  Date of Service: 05/05/20 Patient Name: [name] Patient MRN: [id number] Patient DOB: [birth year] Provider: Ventura Sellers, MD  Identifying Statement:  [name] is a 79 y.o. 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 0/14 lymph nodes.  No perineural invasion or LVI.  negative margins.   06/05/2016 Imaging   Although the previously described ground-glass attenuation nodule in the superior segment of the right lower lobe measures slightly larger on today's examination than prior studies, this is favored to be technique related. Today's study should serve as a baseline for future followup examinations. At this time, there is no central solid component to this lesion. No new nodules are noted. 2. Stable postoperative scarring in the left lower lobe and post infectious scarring throughout the visualize lung bases, as above. 3. Areas of cylindrical bronchiectasis throughout the lung bases bilaterally    05/09/2017 Imaging   IMPRESSION: 1. Status post left lower lobe wedge resection. No findings to suggest local recurrence of disease or definite metastatic disease in the thorax. Previously noted ground-glass attenuation nodule in the superior segment of the right lower lobe appears slightly smaller than the prior study, suggesting a benign etiology. 2. Diffuse bronchial wall thickening with moderate to severe centrilobular and paraseptal emphysema; imaging findings suggestive of underlying COPD. 3. Aortic atherosclerosis, in addition to left main and left circumflex coronary artery disease. Assessment for potential risk factor  modification, dietary therapy or pharmacologic therapy may be warranted, if clinically indicated. 4. Increasing anterior pericardial fluid and/or thickening. This is unlikely to be of hemodynamic significance at this time. No associated pericardial calcification. 5. New (but non acute) compression fracture of T6 with approximately 90% loss of anterior vertebral body height, in addition to multiple other chronic compression fractures of the visualize the thoracolumbar spine.   03/22/2020 Genetic Testing   PD-L1     03/29/2020 Genetic Testing   [facility]       CNS Oncologic History 03/17/19: Left parietal pre-op SRS, plus SRS to 4 additional smaller targets 03/18/19: Left parietal craniotomy for resection of tumor with Dr. Kathyrn Sheriff  01/01/20: SRS to 2 additional sub-cm metastases  Interval History: The patient's records from the referring physician were obtained and reviewed and the patient interviewed to confirm this HPI.  [name] presents following recent MRI brain.  No recent changes, continues to live at the senior center without issue. She describes no new or progressive neurologic deficits.  Right sided visual field impairment in static as prior since surgery.  Still utilizes walker for ambulation.  No seizures or headaches.  Not dosing any decadron.  Medications: Current Outpatient Medications on File Prior to Visit  Medication Sig Dispense Refill  . albuterol (VENTOLIN HFA) 108 (90 Base) MCG/ACT inhaler Inhale 2 puffs into the lungs every 6 (six) hours as needed for wheezing or shortness of breath. 18 g 2  . ALPRAZolam (XANAX) 0.5 MG tablet TAKE (1) TABLET BY MOUTH TWICE A DAY AS NEEDED. 60 tablet 3  . citalopram (CELEXA) 40 MG tablet TAKE (1/2) TABLET BY MOUTH AT BEDTIME. 45 tablet 5  . denosumab (PROLIA) 60 MG/ML SOSY injection Inject 60 mg into the skin every 6 (  six) months. 1 mL 0  . ferrous sulfate 325 (65 FE) MG tablet Take 325 mg by mouth daily with  breakfast.    . fluticasone (FLONASE) 50 MCG/ACT nasal spray INSTILL 2 SPRAYS INTO BOTH NOSTRILS DAILY. 16 g 0  . levocetirizine (XYZAL) 5 MG tablet TAKE ONE TABLET BY MOUTH ONCE DAILY. 30 tablet 0  . levothyroxine (SYNTHROID) 50 MCG tablet TAKE 1 TABLET BEFORE BREAKFAST. 90 tablet 0  . Multiple Vitamin (MULTIVITAMIN WITH MINERALS) TABS tablet Take 1 tablet by mouth daily.    . ondansetron (ZOFRAN) 4 MG tablet TAKE 1 TABLET BY MOUTH 3 TIMES DAILY AS NEEDED FOR NAUSEA AND VOMITING. 90 tablet 0  . OVER THE COUNTER MEDICATION Bone Support 2-4 tabs daily    . OXcarbazepine (TRILEPTAL) 150 MG tablet TAKE (1) TABLET BY MOUTH TWICE DAILY. 60 tablet 3  . pantoprazole (PROTONIX) 40 MG tablet TAKE (1) TABLET BY MOUTH ONCE DAILY BEFORE BREAKFAST. 90 tablet 3  . Probiotic Product (ALIGN PO) Take by mouth daily.    . TRELEGY ELLIPTA 100-62.5-25 MCG/INH AEPB INHALE 1 PUFF INTO LUNGS DAILY. 60 each 5   No current facility-administered medications on file prior to visit.    Allergies:  Allergies  Allergen Reactions  . Ciprofloxacin Hcl Hives  . Sulfonamide Derivatives Hives and Rash  . Iohexol Other (See Comments)    UNSPECIFIED REACTION  Desc: Per [facility], pt is allergic to IV contrast, no type of reaction was available. Pt cannot remember but first reacted around 15 yrs ago from an IVP. Notes were date from 2009 at urology center., Onset Date: [id number]   . Iodinated Diagnostic Agents Rash and Itching  . Prozac [Fluoxetine Hcl] Rash   Past Medical History:  Past Medical History:  Diagnosis Date  . 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 Allergic rhinitis   . 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 Anxiety disorder   . Aortic aneurysm ([place])   . Brain metastasis ([place])    lung cancer s/p resection 7/20  . Complication of anesthesia 2011   bloodpressure dropped during colonoscopy, not problems since  . Compression fracture   . COPD  (chronic obstructive pulmonary disease) ([place])   . Depression   . Diastolic dysfunction   . Diverticulosis   . Dysphagia   . Emphysema lung ([facility] [place]) 11/08/2014  . Headache   . Hiatal hernia   . History of kidney stones   . Hypothyroidism   . IBS (irritable bowel syndrome)   . Iron deficiency anemia due to chronic blood loss 12/05/2016  . On home O2 12/15/2013   chronic hypoxia  . Pneumonia   . PUD (peptic ulcer disease)    82yr  . Shortness of breath    with exertion  . SIADH (syndrome of inappropriate ADH production) ([place]   . Trigeminal neuralgia    Past Surgical History:  Past Surgical History:  Procedure Laterality Date  . ABDOMINAL HYSTERECTOMY    . APPLICATION OF CRANIAL NAVIGATION N/A 03/18/2019   Procedure: APPLICATION OF CRANIAL NAVIGATION;  Surgeon: NConsuella Lose MD;  [facility]  Service: Neurosurgery;  Laterality: N/A;  . BACK SURGERY  January 13, 2014  . BRAIN SURGERY N/A    Phreesia 03/28/2020  . CATARACT EXTRACTION    . CATARACT EXTRACTION W/PHACO  09/02/2012   Procedure: CATARACT EXTRACTION PHACO AND INTRAOCULAR LENS PLACEMENT (IOC);  Surgeon: MElta GuadeloupeT. SGershon Crane MD;  [facility];  Service: Ophthalmology;  Laterality: Left;  CDE:10.35  . CHOLECYSTECTOMY    . 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 COLONOSCOPY N/A 11/23/2014   Dr. Oneida Alar: moderate diverticulosis, hemorrhoids, redundant colon. next TCS in 10-15 years.   . CRANIOTOMY Left 03/18/2019   Procedure: Left stereotactic craniotomy for tumor resection;  Surgeon: Consuella Lose, MD;  Location: [facility];  Service: Neurosurgery;  Laterality: Left;  Left stereotactic craniotomy for tumor resection  . ENDARTERECTOMY Right 08/21/2013   Procedure: RIGHT CAROTID ANEURYSM RESECTION;  Surgeon: Rosetta Posner, MD;  Location: [facility];  Service: Vascular;  Laterality: Right;  . ESOPHAGEAL DILATION  02/24/2018   Procedure: ESOPHAGEAL DILATION;  Surgeon: Danie Binder, MD;   [facility];  Service: Endoscopy;;  . ESOPHAGOGASTRODUODENOSCOPY  11/13/2009   w/dilation to 15m, gastric ulceration (H.Pylori) s/p treatment  . ESOPHAGOGASTRODUODENOSCOPY  11/21/2009   distal esophageal web, gastritis  . ESOPHAGOGASTRODUODENOSCOPY  03/14/12   SFUX:NATFTDDUKin the distal esophagus/Mild gastritis/small HH. + H.pylori, prescribed Pylera. Finished treatment.   . ESOPHAGOGASTRODUODENOSCOPY N/A 12/13/2017   Dr. FOneida Alar esophageal web s/p dilation, gastritis, no h.pylori  . ESOPHAGOGASTRODUODENOSCOPY N/A 02/24/2018   Dr. FOneida Alar web in prox esophagus and in distal esophagus s/p dilation, mild gastritis, mild pyloric stenosis, mild post ulcer duodenal deformity at D1/D2  . fatty tumor removal from lt groin    . FRACTURE SURGERY Left    hand and left ring finger  . GIVENS CAPSULE STUDY N/A 12/26/2017   incomplete  . GIVENS CAPSULE STUDY N/A 02/24/2018   normal small bowel  . KNEE SURGERY     patella tendon repair june 2019 [facility]  . LUNG CANCER SURGERY  12/2010   Left VATS, minithoracotomy, LLL superior segmentectomy  . ORIF PATELLA Right 03/18/2018   Procedure: OPEN REDUCTION INTERNAL (ORIF) FIXATION RIGHT PATELLA;  Surgeon: HCarole Civil MD;  [facility];  Service: Orthopedics;  Laterality: Right;  . ORIF WRIST FRACTURE Left 04/09/2014   Procedure: OPEN REDUCTION INTERNAL FIXATION (ORIF) WRIST FRACTURE;  Surgeon: TRenette Butters MD;  [facility]  Service: Orthopedics;  Laterality: Left;  . PERCUTANEOUS PINNING Left 04/09/2014   Procedure: PERCUTANEOUS PINNING EXTREMITY;  Surgeon: TRenette Butters MD;  [facility]  Service: Orthopedics;  Laterality: Left;  . SAVORY DILATION N/A 12/13/2017   Procedure: SAVORY DILATION;  Surgeon: FDanie Binder MD;  [facility];  Service: Endoscopy;  Laterality: N/A;  . TUBAL LIGATION    . vocal cord surgery  02/06/2011   laryngoscopy with bilateral vocal cord Radiesse injection for vocal cord paralysis  .  YAG LASER APPLICATION Left 102/54/2706  Procedure: YAG LASER APPLICATION;  Surgeon: MRutherford Guys MD;  [facility];  Service: Ophthalmology;  Laterality: Left;   Social History:  Social History   Socioeconomic History  . Marital status: Divorced    Spouse name: Not on file  . Number of children: Not on file  . Years of education: Not on file  . Highest education level: Not on file  Occupational History  . Not on file  Tobacco Use  . Smoking status: Former Smoker    Packs/day: 0.50    Years: 20.00    Pack years: 10.00    Types: Cigarettes    Quit date: 12/21/2010    Years since quitting: 9.3  . Smokeless tobacco: Never Used  . Tobacco comment: smoking cessation info given and reviewed   Vaping Use  .  Vaping Use: Never used  Substance and Sexual Activity  . Alcohol use: Not Currently    Alcohol/week: 0.0 standard drinks  . Drug use: No  . Sexual activity: Yes    Birth control/protection: Surgical  Other Topics Concern  . Not on file  Social History Narrative   Divorced since 89.Lives alone with a dog.Marland KitchenRetired.  She receives Meals on Wheels.   Social Determinants of Health   Financial Resource Strain: Low Risk   . Difficulty of Paying Living Expenses: Not hard at all  Food Insecurity:   . Worried About Charity fundraiser in the Last Year:   . Arboriculturist in the Last Year:   Transportation Needs:   . Film/video editor (Medical):   Marland Kitchen Lack of Transportation (Non-Medical):   Physical Activity:   . Days of Exercise per Week:   . Minutes of Exercise per Session:   Stress:   . Feeling of Stress :   Social Connections:   . Frequency of Communication with Friends and Family:   . Frequency of Social Gatherings with Friends and Family:   . Attends Religious Services:   . Active Member of Clubs or Organizations:   . Attends Archivist Meetings:   Marland Kitchen Marital Status:   Intimate Partner Violence:   . Fear of Current or Ex-Partner:   . Emotionally  Abused:   Marland Kitchen Physically Abused:   . Sexually Abused:    Family History:  Family History  Problem Relation Age of Onset  . Pulmonary embolism Mother   . Heart attack Father   . Hypertension Father   . Liver cancer Sister   . Cancer Sister   . Cancer Brother   . Cancer Daughter   . Breast cancer Daughter   . Colon cancer Neg Hx     Review of Systems: Constitutional: Doesn't report fevers, chills or abnormal weight loss Eyes: Doesn't report blurriness of vision Ears, nose, mouth, throat, and face: Doesn't report sore throat Respiratory: Doesn't report cough, dyspnea or wheezes Cardiovascular: Doesn't report palpitation, chest discomfort  Gastrointestinal:  Doesn't report nausea, constipation, diarrhea GU: Doesn't report incontinence Skin: Doesn't report skin rashes Neurological: Per HPI Musculoskeletal: Doesn't report joint pain Behavioral/Psych: Doesn't report anxiety  Physical Exam: Vitals:   05/05/20 1217  BP: 114/71  Pulse: 69  Resp: 17  Temp: 97.6 F (36.4 C)  SpO2: 96%   KPS: 80. General: Alert, cooperative, pleasant, in no acute distress Head: Normal EENT: No conjunctival injection or scleral icterus.  Lungs: Resp effort normal Cardiac: Regular rate Abdomen: Non-distended abdomen Skin: No rashes cyanosis or petechiae. Extremities: No clubbing or edema  Neurologic Exam: Mental Status: Awake, alert, attentive to examiner. Oriented to self and environment. Language is fluent with intact comprehension.  Cranial Nerves: Visual acuity is grossly normal. Right homonymous hemianopia. Extra-ocular movements intact. No ptosis. Face is symmetric Motor: Tone and bulk are normal. Power is full in both arms and legs. Reflexes are symmetric, no pathologic reflexes present.  Sensory: Intact to light touch Gait: Normal.   Labs: I have reviewed the data as listed    Component Value Date/Time   NA 134 (L) 03/30/2020 1144   K 4.2 03/30/2020 1144   CL 92 (L) 03/30/2020  1144   CO2 31 03/30/2020 1144   GLUCOSE 112 (H) 03/30/2020 1144   BUN 16 03/30/2020 1144   CREATININE 0.45 03/30/2020 1144   CREATININE 0.83 01/26/2020 1623   CALCIUM 9.6 03/30/2020 1144  PROT 6.6 03/15/2020 1004   ALBUMIN 4.1 03/15/2020 1004   AST 21 03/15/2020 1004   ALT 29 03/15/2020 1004   ALKPHOS 53 03/15/2020 1004   BILITOT 0.3 03/15/2020 1004   GFRNONAA >60 03/30/2020 1144   GFRNONAA 68 01/26/2020 1623   GFRAA >60 03/30/2020 1144   GFRAA 78 01/26/2020 1623   Lab Results  Component Value Date   WBC 9.5 03/30/2020   NEUTROABS 7.7 03/30/2020   HGB 12.8 03/30/2020   HCT 39.2 03/30/2020   MCV 94.0 03/30/2020   PLT 370 03/30/2020    Imaging:  [facility] Clinician Interpretation: I have personally reviewed the CNS images as listed.  My interpretation, in the context of the patient's clinical presentation, is progressive disease  MR BRAIN W WO CONTRAST  Result Date: 05/02/2020 CLINICAL DATA:  Metastatic lung cancer. SRS to 5 brain metastases on 03/17/2019 followed by left parietal craniotomy for tumor resection on 03/18/2019. SRS to 2 additional metastases on 01/01/2020. EXAM: MRI HEAD WITHOUT AND WITH CONTRAST TECHNIQUE: Multiplanar, multiecho pulse sequences of the brain and surrounding structures were obtained without and with intravenous contrast. CONTRAST:  65m MULTIHANCE GADOBENATE DIMEGLUMINE 529 MG/ML IV SOLN COMPARISON:  12/24/2019 FINDINGS: BRAIN New Lesions: 1. 4 mm enhancing lesion in the medial right cerebellum (series 11, image 42). 2. 5 mm enhancing lesion in the high posterior right frontal lobe (series 11, image 141) Larger lesions: A hemosiderin stained left parieto-occipital resection cavity is again noted with largely stable curvilinear enhancement which is likely treatment related. The small region of increased enhancement at the superolateral aspect of the cavity on the prior study has decreased, while a 3 mm focus of adjacent enhancement has increased (series 11,  image 86). Moderate nonenhancing T2 hyperintensity in the surrounding white matter is unchanged. Stable or Smaller lesions: 1. 2 mm enhancing lesion in the medial left frontal lobe, decreased in size (series 11 image 118, previously 5 mm). 2. The enhancing right internal capsule lesion on the prior study is no longer visible. Other Brain findings: No acute infarct or midline shift is evident. A small fluid collection subjacent to the left parieto-occipital craniotomy is unchanged. Small T2 hyperintensities in the cerebral white matter bilaterally and in the pons are also unchanged and nonspecific but compatible with mild chronic small vessel ischemic disease. There is mild cerebral atrophy Vascular: Major intracranial vascular flow voids are preserved. Unchanged slight aneurysmal dilatation of a left MCA branch vessel in a left parietal sulcus measuring 2 mm (series 11, image 89). Skull and upper cervical spine: No suspicious marrow lesion. Sinuses/Orbits: Bilateral cataract extraction. Clear paranasal sinuses. Trace left mastoid fluid. Other: None. IMPRESSION: 1. Two new subcentimeter metastases in the right cerebellum and right frontal lobe. 2. Decreased size of the 2 lesions that were treated in 12/2019. 3. Minimal mixed interval changes in enhancement at the left parieto-occipital resection cavity favoring evolving post treatment changes. Continued attention on follow-up. Electronically Signed   By: ALogan BoresM.D.   On: 05/02/2020 10:25   Assessment/Plan Brain metastases ([place] [C79.31]  Neeva S Perlmutter is clinically stable today.  MRI demonstrates two new CNS metastases clearly outside any prior treatment field.  Fortunately these remain sub-centimeter and asymptomatic.   Case will be reviewed in upcoming brain/spine tumor board, but these lesions will be amenable to salvage radiosurgery.  Of greater concern is frequency of seeding on metastases within CNS.  CT imaging did demonstrate modest changes  recently, suspicion is for active and under-treated systemic disease.  She would prefer to receive medical oncology care here in [place] going forward.  Referral will be placed for evaluation with Dr. [name] or Dr. Julien Nordmann, for consideration of initiating systemic therapy in setting of refractory CNS disease burden.     We ask that YAILIN BIEDERMAN return to clinic in 3 months following planned radiosurgery with brain MRI, or sooner as needed.  She understood risks/benefits and consented to salvage SRS today.  We appreciate the opportunity to participate in the care of [facility].   All questions were answered. The patient knows to call the clinic with any problems, questions or concerns. No barriers to learning were detected.  I have spent a total of 40 minutes of face-to-face and non-face-to-face time, excluding clinical staff time, preparing to see patient, ordering tests and/or medications, counseling the patient, and independently interpreting results and communicating results to the patient/family/caregiver   Ventura Sellers, MD Medical Director of Neuro-Oncology [facility] [place] 05/05/20 12:18 PM

## 2020-05-09 ENCOUNTER — Inpatient Hospital Stay: Payer: PPO | Attending: Internal Medicine

## 2020-05-10 ENCOUNTER — Telehealth: Payer: Self-pay | Admitting: *Deleted

## 2020-05-10 ENCOUNTER — Other Ambulatory Visit: Payer: Self-pay | Admitting: Urology

## 2020-05-10 DIAGNOSIS — C7931 Secondary malignant neoplasm of brain: Secondary | ICD-10-CM

## 2020-05-10 NOTE — Chronic Care Management (AMB) (Addendum)
Chronic Care Management   Follow Up Note   05/11/2020 Name: [name] MRN: [id number] DOB: Feb 18, 1941  Referred by: Doree Albee, MD Reason for referral : Chronic Care Management (Follow up)   [name] is a 79 y.o. year old female who is a primary care patient of Gosrani, Doristine Johns, MD. The CCM team was consulted for assistance with chronic disease management and care coordination needs.    Review of patient status, including review of consultants reports, relevant laboratory and other test results, and collaboration with appropriate care team members and the patient's provider was performed as part of comprehensive patient evaluation and provision of chronic care management services.    SDOH (Social Determinants of Health) assessments performed: No See Care Plan activities for detailed interventions related to [facility])      Outpatient Encounter Medications as of 05/11/2020  Medication Sig   albuterol (VENTOLIN HFA) 108 (90 Base) MCG/ACT inhaler Inhale 2 puffs into the lungs every 6 (six) hours as needed for wheezing or shortness of breath.   ALPRAZolam (XANAX) 0.5 MG tablet TAKE (1) TABLET BY MOUTH TWICE A DAY AS NEEDED.   citalopram (CELEXA) 40 MG tablet TAKE (1/2) TABLET BY MOUTH AT BEDTIME.   denosumab (PROLIA) 60 MG/ML SOSY injection Inject 60 mg into the skin every 6 (six) months.   ferrous sulfate 325 (65 FE) MG tablet Take 325 mg by mouth daily with breakfast.   fluticasone (FLONASE) 50 MCG/ACT nasal spray INSTILL 2 SPRAYS INTO BOTH NOSTRILS DAILY.   levocetirizine (XYZAL) 5 MG tablet TAKE ONE TABLET BY MOUTH ONCE DAILY.   levothyroxine (SYNTHROID) 50 MCG tablet TAKE 1 TABLET BEFORE BREAKFAST.   Multiple Vitamin (MULTIVITAMIN WITH MINERALS) TABS tablet Take 1 tablet by mouth daily.   ondansetron (ZOFRAN) 4 MG tablet TAKE 1 TABLET BY MOUTH 3 TIMES DAILY AS NEEDED FOR NAUSEA AND VOMITING.   OVER THE COUNTER MEDICATION Bone Support 2-4 tabs daily   OXcarbazepine  (TRILEPTAL) 150 MG tablet TAKE (1) TABLET BY MOUTH TWICE DAILY.   pantoprazole (PROTONIX) 40 MG tablet TAKE (1) TABLET BY MOUTH ONCE DAILY BEFORE BREAKFAST.   Probiotic Product (ALIGN PO) Take by mouth daily.   TRELEGY ELLIPTA 100-62.5-25 MCG/INH AEPB INHALE 1 PUFF INTO LUNGS DAILY.   No facility-administered encounter medications on file as of 05/11/2020.     Objective:  3 month follow up of chronic diseases and medication accessibility. Goals Addressed             This Visit's Progress    COPD       CARE PLAN ENTRY  Current Barriers:  Chronic Disease Management support, education, and care coordination needs related to COPD  Pharmacist Clinical Goal(s):  Over the next 180 days patient will work with providers and PharmD towards optimal COPD medication regimen.  Interventions: Comprehensive medication review performed. Continue current therapy as it seems to be controlling COPD exacerbations and patient not experiencing any symptoms from the medications. Discussed frequency of exacerbations.  Patient Self Care Activities:  Calls provider office for new concerns or questions Over the next 180 days the patient will report any new symptoms from medication or increase in shortness of breath episodes to provider or PharmD.  Initial goal documentation      Hypertension - BP < 140/90       CARE PLAN ENTRY (see longitudinal plan of care for additional care plan information)  Current Barriers:  Uncontrolled hypertension, complicated by hyperlipidemia, COPD, and trigeminal neuralgia. Current antihypertensive  regimen: none Previous antihypertensives tried: none  Current home BP readings: not checking regularly   Pharmacist Clinical Goal(s):  Over the next 180 days, patient will work with PharmD and providers to maintain control of blood pressure. Interventions: Inter-disciplinary care team collaboration (see longitudinal plan of care) Comprehensive medication review  performed; medication list updated in the electronic medical record.  Continue to monitor when she is feeling "off."  Patient Self Care Activities:  Over the next 180 days, patient will continue to check BP as she feels symptomatic , document, and provide at future appointments  Initial goal documentation        . COPD   Patient has failed these meds in past: Anoro Patient is currently controlled on the following medications:  Trelegy Ellipta 100-62.5-25 mcg Albuterol HFA 34mcg  Patient reports breathing has been controlled since her trip to the ED in June.  Using her inhalers correctly.  No issues with throat irritation.  No changes to her medications since last CCM visit and all are affordable at this current time.  Plan  Continue current medications   Hypertension   BP goal is:  <140/90  Office blood pressures are  BP Readings from Last 3 Encounters:  05/05/20 114/71  04/18/20 125/80  03/30/20 129/80    Patient has failed these meds in the past: none noted Patient is currently controlled on the following medications:  None  BP controlled on no medication currently.  No signs of hypotension or dizziness.    Plan  Continue to monitor as currently.   F/u Plan:  Overall the patient seems to be in great spirits about everything going on with her health.  Currently no issues with medication and no issues with cost on any of her brand name medications. The care management team will reach out to the patient again over the next 180 days.    Beverly Milch, PharmD Clinical Pharmacist [place] [phone] I have collaborated with the care management provider regarding care management and care coordination activities outlined in this encounter and have reviewed this encounter including documentation in the note and care plan. I am certifying that I agree with the content of this note and encounter as supervising physician.

## 2020-05-10 NOTE — Telephone Encounter (Signed)
Called patient to ask about going to [place], patient agreed to do this

## 2020-05-11 ENCOUNTER — Other Ambulatory Visit (HOSPITAL_COMMUNITY)
Admission: RE | Admit: 2020-05-11 | Discharge: 2020-05-11 | Disposition: A | Discharge status: Home / Self Care | Payer: PPO | Source: Ambulatory Visit | Attending: Urology | Admitting: Urology

## 2020-05-11 ENCOUNTER — Ambulatory Visit: Payer: Self-pay | Admitting: Pharmacist

## 2020-05-11 ENCOUNTER — Ambulatory Visit: Payer: PPO | Admitting: Radiation Oncology

## 2020-05-11 DIAGNOSIS — C7931 Secondary malignant neoplasm of brain: Secondary | ICD-10-CM | POA: Diagnosis not present

## 2020-05-11 DIAGNOSIS — J449 Chronic obstructive pulmonary disease, unspecified: Secondary | ICD-10-CM

## 2020-05-11 DIAGNOSIS — I10 Essential (primary) hypertension: Secondary | ICD-10-CM

## 2020-05-11 LAB — CREATININE, SERUM
Creatinine, Ser: 0.46 mg/dL (ref 0.44–1.00)
GFR calc Af Amer: >60 mL/min (ref >60)
GFR calc non Af Amer: >60 mL/min (ref >60)

## 2020-05-11 LAB — BUN: BUN: 19 mg/dL (ref 8–23)

## 2020-05-11 NOTE — Patient Instructions (Addendum)
Visit Information [name] talking to you again!  Keep up the good spirits!!! Goals Addressed            This Visit's Progress   . COPD       CARE PLAN ENTRY  Current Barriers:  . Chronic Disease Management support, education, and care coordination needs related to COPD  Pharmacist Clinical Goal(s):  Marland Kitchen Over the next 180 days patient will work with providers and PharmD towards optimal COPD medication regimen.  Interventions: . Comprehensive medication review performed. . Continue current therapy as it seems to be controlling COPD exacerbations and patient not experiencing any symptoms from the medications. . Discussed frequency of exacerbations.  Patient Self Care Activities:  . Calls provider office for new concerns or questions . Over the next 180 days the patient will report any new symptoms from medication or increase in shortness of breath episodes to provider or PharmD.  Initial goal documentation     . Hypertension - BP < 140/90       CARE PLAN ENTRY (see longitudinal plan of care for additional care plan information)  Current Barriers:  . Uncontrolled hypertension, complicated by hyperlipidemia, COPD, and trigeminal neuralgia. . Current antihypertensive regimen: none . Previous antihypertensives tried: none  . Current home BP readings: not checking regularly   Pharmacist Clinical Goal(s):  Marland Kitchen Over the next 180 days, patient will work with PharmD and providers to maintain control of blood pressure. Interventions: . Inter-disciplinary care team collaboration (see longitudinal plan of care) . Comprehensive medication review performed; medication list updated in the electronic medical record.  . Continue to monitor when she is feeling "off."  Patient Self Care Activities:  . Over the next 180 days, patient will continue to check BP as she feels symptomatic , document, and provide at future appointments  Initial goal documentation        The patient verbalized  understanding of instructions provided today and agreed to receive a mailed copy of patient instruction and/or educational materials.  Telephone follow up appointment with pharmacy team member scheduled for: 6 months  Beverly Milch, PharmD Clinical Pharmacist [facility] [phone]   How to Use a Metered Dose Inhaler A metered dose inhaler is a handheld device for taking medicine that must be breathed into the lungs (inhaled). The device can be used to deliver a variety of inhaled medicines, including:  Quick relief or rescue medicines, such as bronchodilators.  Controller medicines, such as corticosteroids. The medicine is delivered by pushing down on a metal canister to release a preset amount of spray and medicine. Each device contains the amount of medicine that is needed for a preset number of uses (inhalations). Your health care provider may recommend that you use a spacer with your inhaler to help you take the medicine more effectively. A spacer is a plastic tube with a mouthpiece on one end and an opening that connects to the inhaler on the other end. A spacer holds the medicine in a tube for a short time, which allows you to inhale more medicine. What are the risks? If you do not use your inhaler correctly, medicine might not reach your lungs to help you breathe. Inhaler medicine can cause side effects, such as:  Mouth or throat infection.  Cough.  Hoarseness.  Headache.  Nausea and vomiting.  Lung infection (pneumonia) in people who have a lung condition called COPD. How to use a metered dose inhaler without a spacer  1. Remove the cap from  the inhaler. 2. If you are using the inhaler for the first time, shake it for 5 seconds, turn it away from your face, then release 4 puffs into the air. This is called priming. 3. Shake the inhaler for 5 seconds. 4. Position the inhaler so the top of the canister faces up. 5. Put your index finger on the top  of the medicine canister. Support the bottom of the inhaler with your thumb. 6. Breathe out normally and as completely as possible, away from the inhaler. 7. Either place the inhaler between your teeth and close your lips tightly around the mouthpiece, or hold the inhaler 1-2 inches (2.5-5 cm) away from your open mouth. Keep your tongue down out of the way. If you are unsure which technique to use, ask your health care provider. 8. Press the canister down with your index finger to release the medicine, then inhale deeply and slowly through your mouth (not your nose) until your lungs are completely filled. Inhaling should take 4-6 seconds. 9. Hold the medicine in your lungs for 5-10 seconds (10 seconds is best). This helps the medicine get into the small airways of your lungs. 10. With your lips in a tight circle (pursed), breathe out slowly. 11. Repeat steps 3-10 until you have taken the number of puffs that your health care provider directed. Wait about 1 minute between puffs or as directed. 12. Put the cap on the inhaler. 13. If you are using a steroid inhaler, rinse your mouth with water, gargle, and spit out the water. Do not swallow the water. How to use a metered dose inhaler with a spacer  1. Remove the cap from the inhaler. 2. If you are using the inhaler for the first time, shake it for 5 seconds, turn it away from your face, then release 4 puffs into the air. This is called priming. 3. Shake the inhaler for 5 seconds. 4. Place the open end of the spacer onto the inhaler mouthpiece. 5. Position the inhaler so the top of the canister faces up and the spacer mouthpiece faces you. 6. Put your index finger on the top of the medicine canister. Support the bottom of the inhaler and the spacer with your thumb. 7. Breathe out normally and as completely as possible, away from the spacer. 8. Place the spacer between your teeth and close your lips tightly around it. Keep your tongue down out of the  way. 9. Press the canister down with your index finger to release the medicine, then inhale deeply and slowly through your mouth (not your nose) until your lungs are completely filled. Inhaling should take 4-6 seconds. 10. Hold the medicine in your lungs for 5-10 seconds (10 seconds is best). This helps the medicine get into the small airways of your lungs. 11. With your lips in a tight circle (pursed), breathe out slowly. 12. Repeat steps 3-11 until you have taken the number of puffs that your health care provider directed. Wait about 1 minute between puffs or as directed. 13. Remove the spacer from the inhaler and put the cap on the inhaler. 14. If you are using a steroid inhaler, rinse your mouth with water, gargle, and spit out the water. Do not swallow the water. Follow these instructions at home:  Take your inhaled medicine only as told by your health care provider. Do not use the inhaler more than directed by your health care provider.  Keep all follow-up visits as told by your health care provider. This  is important.  If your inhaler has a counter, you can check it to determine how full your inhaler is. If your inhaler does not have a counter, ask your health care provider when you will need to refill your inhaler and write the refill date on a calendar or on your inhaler canister. Note that you cannot know when an inhaler is empty by shaking it.  Follow directions on the package insert for care and cleaning of your inhaler and spacer. Contact a health care provider if:  Symptoms are only partially relieved with your inhaler.  You are having trouble using your inhaler.  You have an increase in phlegm.  You have headaches. Get help right away if:  You feel little or no relief after using your inhaler.  You have dizziness.  You have a fast heart rate.  You have chills or a fever.  You have night sweats.  There is blood in your phlegm. Summary  A metered dose inhaler is a  handheld device for taking medicine that must be breathed into the lungs (inhaled).  The medicine is delivered by pushing down on a metal canister to release a preset amount of spray and medicine.  Each device contains the amount of medicine that is needed for a preset number of uses (inhalations). This information is not intended to replace advice given to you by your health care provider. Make sure you discuss any questions you have with your health care provider. Document Revised: 09/06/2017 Document Reviewed: 08/14/2016 Elsevier Patient Education  2020 [employer].

## 2020-05-12 ENCOUNTER — Other Ambulatory Visit: Payer: Self-pay

## 2020-05-12 ENCOUNTER — Encounter: Payer: Self-pay | Admitting: Urology

## 2020-05-12 ENCOUNTER — Ambulatory Visit
Admission: RE | Admit: 2020-05-12 | Discharge: 2020-05-12 | Disposition: A | Discharge status: Home / Self Care | Payer: PPO | Source: Ambulatory Visit | Attending: Radiation Oncology | Admitting: Radiation Oncology

## 2020-05-12 ENCOUNTER — Ambulatory Visit
Admission: RE | Admit: 2020-05-12 | Discharge: 2020-05-12 | Disposition: A | Discharge status: Home / Self Care | Payer: PPO | Source: Ambulatory Visit | Attending: Urology | Admitting: Urology

## 2020-05-12 VITALS — BP 119/88 | HR 105 | Temp 98.1°F | Resp 20 | Ht 60.0 in

## 2020-05-12 DIAGNOSIS — C3432 Malignant neoplasm of lower lobe, left bronchus or lung: Secondary | ICD-10-CM | POA: Diagnosis not present

## 2020-05-12 DIAGNOSIS — C7931 Secondary malignant neoplasm of brain: Secondary | ICD-10-CM

## 2020-05-12 DIAGNOSIS — Z510 Encounter for antineoplastic radiation therapy: Secondary | ICD-10-CM | POA: Diagnosis not present

## 2020-05-12 MED ORDER — SODIUM CHLORIDE 0.9% FLUSH
10.0000 mL | INTRAVENOUS | Status: AC
  Administered 2020-05-12: 10 mL via INTRAVENOUS

## 2020-05-12 NOTE — Progress Notes (Signed)
Has armband been applied?  Yes.    Does patient have an allergy to IV contrast dye?: No.   Has patient ever received premedication for IV contrast dye?: No.   Does patient take metformin?: No.  If patient does take metformin when was the last dose: n/a  Date of lab work: 05/11/2020 BUN: 19 CR: 0.46  IV site: antecubital right, condition no redness  Has IV site been added to flowsheet?  Yes.    BP 119/88   Pulse (!) 105   Temp 98.1 F (36.7 C)   Resp 20   Ht 5' (1.524 m)   SpO2 92% Comment: on 2 liter oxygen therapy via nasal cannula  PF (!) 2 L/min   BMI 21.48 kg/m

## 2020-05-16 ENCOUNTER — Other Ambulatory Visit: Payer: Self-pay | Admitting: Family Medicine

## 2020-05-16 ENCOUNTER — Telehealth: Payer: Self-pay

## 2020-05-16 ENCOUNTER — Encounter: Payer: Self-pay | Admitting: Family Medicine

## 2020-05-16 ENCOUNTER — Telehealth: Payer: Self-pay | Admitting: Internal Medicine

## 2020-05-16 ENCOUNTER — Telehealth: Payer: Self-pay | Admitting: Family Medicine

## 2020-05-16 DIAGNOSIS — M25551 Pain in right hip: Secondary | ICD-10-CM

## 2020-05-16 NOTE — Telephone Encounter (Signed)
I ordered xray of right hip

## 2020-05-16 NOTE — Telephone Encounter (Signed)
Received TC from patient stating that she is scheduled for radiation on Friday and would like to know if it was possible for her to get a scan of her hip on Friday as well. She states that she has been told that her hip is close to needing replacement and wants Dr Mickeal Skinner opinion on this. Dr Mickeal Skinner made aware of pt concern.

## 2020-05-16 NOTE — Telephone Encounter (Signed)
Returned call to pt and let her know that per Dr Mickeal Skinner, he would have to refer to PCP for those questions. Pt verbalized understanding and state that she would call her PCP today. No further problems pr concerns at this time.

## 2020-05-16 NOTE — Telephone Encounter (Signed)
CB# [phone] Would like for Dr.Pickard order a xray, ct or mri of her right hip still heaving pain

## 2020-05-16 NOTE — Telephone Encounter (Signed)
Patient aware per MyChart.   

## 2020-05-16 NOTE — Telephone Encounter (Signed)
error 

## 2020-05-17 DIAGNOSIS — C7931 Secondary malignant neoplasm of brain: Secondary | ICD-10-CM | POA: Diagnosis not present

## 2020-05-17 DIAGNOSIS — Z510 Encounter for antineoplastic radiation therapy: Secondary | ICD-10-CM | POA: Diagnosis not present

## 2020-05-20 ENCOUNTER — Encounter: Payer: Self-pay | Admitting: Radiation Oncology

## 2020-05-20 ENCOUNTER — Other Ambulatory Visit: Payer: Self-pay

## 2020-05-20 ENCOUNTER — Ambulatory Visit (HOSPITAL_COMMUNITY)
Admission: RE | Admit: 2020-05-20 | Discharge: 2020-05-20 | Disposition: A | Discharge status: Home / Self Care | Payer: PPO | Source: Ambulatory Visit | Attending: Family Medicine | Admitting: Family Medicine

## 2020-05-20 ENCOUNTER — Ambulatory Visit
Admission: RE | Admit: 2020-05-20 | Discharge: 2020-05-20 | Disposition: A | Discharge status: Home / Self Care | Payer: PPO | Source: Ambulatory Visit | Attending: Radiation Oncology | Admitting: Radiation Oncology

## 2020-05-20 VITALS — BP 126/85 | HR 64 | Temp 97.7°F | Resp 17

## 2020-05-20 DIAGNOSIS — M25551 Pain in right hip: Secondary | ICD-10-CM | POA: Insufficient documentation

## 2020-05-20 DIAGNOSIS — C7931 Secondary malignant neoplasm of brain: Secondary | ICD-10-CM

## 2020-05-20 DIAGNOSIS — Z510 Encounter for antineoplastic radiation therapy: Secondary | ICD-10-CM | POA: Diagnosis not present

## 2020-05-20 DIAGNOSIS — M1611 Unilateral primary osteoarthritis, right hip: Secondary | ICD-10-CM | POA: Diagnosis not present

## 2020-05-20 NOTE — Progress Notes (Signed)
[name] rested with [place] for 30 minutes following SRS treatment.  She denies headache, dizziness, nausea, diplopia or ringing in the ears. Vitals stable. Instructed patient to avoid strenuous activity for the next 24 hours. Instructed patient to call [phone] with needs related to treatment after hours or over the weekend. Patient verbalized understanding and agreement. Patient assisted out of clinic via wheelchair by friend  Vitals:   05/20/20 0915 05/20/20 0945  BP: 131/79 126/85  Pulse: 61 64  Resp: 18 17  Temp: 97.7 F (36.5 C)   SpO2: 96% 94%

## 2020-05-22 NOTE — Progress Notes (Signed)
  Radiation Oncology         [phone] ________________________________  Stereotactic Treatment Procedure Note  Name: [name] MRN: [id number]  Date: 05/20/2020  DOB: 1940-10-24  SPECIAL TREATMENT PROCEDURE    ICD-10-CM   1. Brain metastasis ([place])  C79.31     3D TREATMENT PLANNING AND DOSIMETRY:  The patient's radiation plan was reviewed and approved by neurosurgery and radiation oncology prior to treatment.  It showed 3-dimensional radiation distributions overlaid onto the planning CT/MRI image set.  The [facility] for the target structures as well as the organs at risk were reviewed. The documentation of the 3D plan and dosimetry are filed in the radiation oncology EMR.  NARRATIVE:  [name] was brought to the TrueBeam stereotactic radiation treatment machine and placed supine on the CT couch. The head frame was applied, and the patient was set up for stereotactic radiosurgery.  Neurosurgery was present for the set-up and delivery  SIMULATION VERIFICATION:  In the couch zero-angle position, the patient underwent Exactrac imaging using the Brainlab system with orthogonal KV images.  These were carefully aligned and repeated to confirm treatment position for each of the isocenters.  The Exactrac snap film verification was repeated at each couch angle.  PROCEDURE: [name] received stereotactic radiosurgery to the following targets: Right cerebellar 5 mm target was treated using 4 Dynamic Conformal Arcs to a prescription dose of 20 Gy.  ExacTrac registration was performed for each couch angle.  The 100% isodose line was prescribed.  6 MV X-rays were delivered in the flattening filter free beam mode. Right frontal 5 mm target was treated using 3 Dynamic Conformal Arcs to a prescription dose of 20 Gy.  ExacTrac registration was performed for each couch angle.  The 100% isodose line was prescribed.  6 MV X-rays were delivered in the flattening filter free beam mode.  STEREOTACTIC  TREATMENT MANAGEMENT:  Following delivery, the patient was transported to nursing in stable condition and monitored for possible acute effects.  Vital signs were recorded BP 126/85 (BP Location: Left Arm, Patient Position: Sitting)   Pulse 64   Temp 97.7 F (36.5 C) (Oral)   Resp 17   SpO2 94% . The patient tolerated treatment without significant acute effects, and was discharged to home in stable condition.    PLAN: Follow-up in one month.  ________________________________  Sheral Apley. Tammi Klippel, M.D.

## 2020-05-25 ENCOUNTER — Telehealth: Payer: Self-pay | Admitting: Radiation Oncology

## 2020-05-25 NOTE — Addendum Note (Signed)
Encounter addended by: Judith Part, MD on: 05/25/2020 10:44 AM  Actions taken: Clinical Note Signed

## 2020-05-25 NOTE — Progress Notes (Signed)
  Name: [name]  MRN: [id number]  Date: 05/20/2020   DOB: May 12, 1941  Stereotactic Radiosurgery Operative Note  PRE-OPERATIVE DIAGNOSIS:  Multiple Brain Metastases  POST-OPERATIVE DIAGNOSIS:  Multiple Brain Metastases  PROCEDURE:  Stereotactic Radiosurgery  SURGEON:  Judith Part, MD  NARRATIVE: The patient underwent a radiation treatment planning session in the radiation oncology simulation suite under the care of the radiation oncology physician and physicist.  I participated closely in the radiation treatment planning afterwards. The patient underwent planning CT which was fused to 3T high resolution MRI with 1 mm axial slices.  These images were fused on the planning system.  We contoured the gross target volumes and subsequently expanded this to yield the Planning Target Volume. I actively participated in the planning process.  I helped to define and review the target contours and also the contours of the optic pathway, eyes, brainstem and selected nearby organs at risk.  All the dose constraints for critical structures were reviewed and compared to AAPM Task Group 101.  The prescription dose conformity was reviewed.  I approved the plan electronically.    Accordingly, BETHANY HIRT was brought to the TrueBeam stereotactic radiation treatment linac and placed in the custom immobilization mask.  The patient was aligned according to the IR fiducial markers with BrainLab Exactrac, then orthogonal x-rays were used in ExacTrac with the 6DOF robotic table and the shifts were made to align the patient  [name] received stereotactic radiosurgery uneventfully.    Lesions treated:  2   Complex lesions treated:  0 (>3.5 cm, <27mm of optic path, or within the brainstem)   The detailed description of the procedure is recorded in the radiation oncology procedure note.  I was present for the duration of the procedure.  DISPOSITION:  Following delivery, the patient was transported to  nursing in stable condition and monitored for possible acute effects to be discharged to home in stable condition with follow-up in one month.  Judith Part, MD 05/25/2020 10:43 AM

## 2020-05-25 NOTE — Telephone Encounter (Signed)
Patient message transferred from Dr. Renda Rolls nurse, Drue Dun, [place]. Phoned patient to inquire. Patient explains that when she placed the call she wasn't feeling well due to gas and constipation. Patient went onto explain she took magnesium citrate, had a bowel movement and feels much better now. Patient verbalized her understanding that she has a consult appointment with Dr. [name] Wednesday coming. Confirmed appointment. She states, "tell Manuela Schwartz if she wants to call me she can to tell me what the next avenue of treatment will be." She goes onto explain her understanding from Dr. Mickeal Skinner she won't be eligible for more radiation. Encouraged patient to phone this RN with future needs related to radiation therapy. She verbalized understanding of all reviewed and expressed appreciation for the call.

## 2020-05-26 ENCOUNTER — Telehealth (INDEPENDENT_AMBULATORY_CARE_PROVIDER_SITE_OTHER): Payer: PPO | Admitting: Family Medicine

## 2020-05-26 ENCOUNTER — Other Ambulatory Visit: Payer: Self-pay

## 2020-05-26 DIAGNOSIS — M25551 Pain in right hip: Secondary | ICD-10-CM

## 2020-05-26 MED ORDER — MELOXICAM 7.5 MG PO TABS
7.5000 mg | ORAL_TABLET | Freq: Every day | ORAL | 0 refills | Status: DC
Start: 2020-05-26 — End: 2020-06-23

## 2020-05-26 NOTE — Progress Notes (Signed)
Subjective:    Patient ID: [name], female    DOB: 1940/10/29, 79 y.o.   MRN: [id number]  HPI Patient is a very sweet 79 year old Caucasian female who presents today for telephone visit.  She is at home.  I am in my office.  She consents to be seen over the telephone.  Phone call began around 12:00.  Phone call ended at 1220.  Past medical history is significant for surgery to remove a tumor from her brain in June 2020.  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  Tumor was removed.  She is also received radiation therapy for 2 lesions seen in the brain in March of this year.  Repeat MRI of the brain in July showed 2 new subcentimeter lesions but interval improvement in the 2 lesions treated in March.  However CT scan of the lungs in June revealed interval progression into small nodules seen in the lung all concerning for metastasis.  She has an appointment scheduled to discuss immunotherapy per her report.  She has received maximum radiation therapy at this time.  She called me last week complaining of right hip pain.  She was concerned that she may have fractured her right hip.  She denies any falls or injuries.  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  She is here today to discuss treatment Past Medical History:  Diagnosis Date  . Adenocarcinoma of lung ([place]) 12/2010   left lung/surg only  . Allergic rhinitis   . 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 Anxiety disorder   . Aortic aneurysm ([place])   . Brain metastasis ([place])    lung cancer s/p resection 7/20  . Cancer ([place])    Phreesia 05/26/2020  . Complication of anesthesia 2011   bloodpressure dropped during colonoscopy, not problems since  . Compression fracture   . COPD (chronic obstructive pulmonary disease) ([place])   . Depression   . Diastolic dysfunction   . Diverticulosis   . Dysphagia    . Emphysema lung ([place]) 11/08/2014  . Headache   . Hiatal hernia   . History of kidney stones   . Hypothyroidism   . IBS (irritable bowel syndrome)   . Iron deficiency anemia due to chronic blood loss 12/05/2016  . On home O2 12/15/2013   chronic hypoxia  . Pneumonia   . PUD (peptic ulcer disease)    68yrs  . Shortness of breath    with exertion  . SIADH (syndrome of inappropriate ADH production) ([place])   . Trigeminal neuralgia    Past Surgical History:  Procedure Laterality Date  . ABDOMINAL HYSTERECTOMY    . APPLICATION OF CRANIAL NAVIGATION N/A 03/18/2019   Procedure: APPLICATION OF CRANIAL NAVIGATION;  Surgeon: Consuella Lose, MD;  Location: [facility];  Service: Neurosurgery;  Laterality: N/A;  . BACK SURGERY  January 13, 2014  . BRAIN SURGERY N/A    Phreesia 03/28/2020  . CATARACT EXTRACTION    . CATARACT EXTRACTION W/PHACO  09/02/2012   Procedure: CATARACT EXTRACTION PHACO AND INTRAOCULAR LENS PLACEMENT (IOC);  Surgeon: Elta Guadeloupe T. Gershon Crane, MD;  Location: [facility];  Service: Ophthalmology;  Laterality: Left;  CDE:10.35  . CHOLECYSTECTOMY    . 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 COLONOSCOPY N/A 11/23/2014   Dr. Oneida Alar: moderate diverticulosis, hemorrhoids, redundant colon. next TCS in  10-15 years.   . CRANIOTOMY Left 03/18/2019   Procedure: Left stereotactic craniotomy for tumor resection;  Surgeon: Consuella Lose, MD;  Location: [facility];  Service: Neurosurgery;  Laterality: Left;  Left stereotactic craniotomy for tumor resection  . ENDARTERECTOMY Right 08/21/2013   Procedure: RIGHT CAROTID ANEURYSM RESECTION;  Surgeon: Rosetta Posner, MD;  Location: [facility];  Service: Vascular;  Laterality: Right;  . ESOPHAGEAL DILATION  02/24/2018   Procedure: ESOPHAGEAL DILATION;  Surgeon: Danie Binder, MD;  [facility];  Service: Endoscopy;;  . ESOPHAGOGASTRODUODENOSCOPY  11/13/2009   w/dilation to 20mm, gastric ulceration (H.Pylori) s/p treatment   . ESOPHAGOGASTRODUODENOSCOPY  11/21/2009   distal esophageal web, gastritis  . ESOPHAGOGASTRODUODENOSCOPY  03/14/12   YSA:[id number] in the distal esophagus/Mild gastritis/small HH. + H.pylori, prescribed Pylera. Finished treatment.   . ESOPHAGOGASTRODUODENOSCOPY N/A 12/13/2017   Dr. Oneida Alar: esophageal web s/p dilation, gastritis, no h.pylori  . ESOPHAGOGASTRODUODENOSCOPY N/A 02/24/2018   Dr. Oneida Alar: web in prox esophagus and in distal esophagus s/p dilation, mild gastritis, mild pyloric stenosis, mild post ulcer duodenal deformity at D1/D2  . fatty tumor removal from lt groin    . FRACTURE SURGERY Left    hand and left ring finger  . GIVENS CAPSULE STUDY N/A 12/26/2017   incomplete  . GIVENS CAPSULE STUDY N/A 02/24/2018   normal small bowel  . KNEE SURGERY     patella tendon repair june 2019 [facility]  . LUNG CANCER SURGERY  12/2010   Left VATS, minithoracotomy, LLL superior segmentectomy  . ORIF PATELLA Right 03/18/2018   Procedure: OPEN REDUCTION INTERNAL (ORIF) FIXATION RIGHT PATELLA;  Surgeon: Carole Civil, MD;  Location[facility];  Service: Orthopedics;  Laterality: Right;  . ORIF WRIST FRACTURE Left 04/09/2014   Procedure: OPEN REDUCTION INTERNAL FIXATION (ORIF) WRIST FRACTURE;  Surgeon: Renette Butters, MD;  Location: [facility];  Service: Orthopedics;  Laterality: Left;  . PERCUTANEOUS PINNING Left 04/09/2014   Procedure: PERCUTANEOUS PINNING EXTREMITY;  Surgeon: Renette Butters, MD;  Location: [facility];  Service: Orthopedics;  Laterality: Left;  . SAVORY DILATION N/A 12/13/2017   Procedure: SAVORY DILATION;  Surgeon: Danie Binder, MD;  [facility];  Service: Endoscopy;  Laterality: N/A;  . TUBAL LIGATION    . vocal cord surgery  02/06/2011   laryngoscopy with bilateral vocal cord Radiesse injection for vocal cord paralysis  . YAG LASER APPLICATION Left 32/35/5732   Procedure: YAG LASER APPLICATION;  Surgeon: Rutherford Guys, MD;  [facility];  Service: Ophthalmology;   Laterality: Left;   Current Outpatient Medications on File Prior to Visit  Medication Sig Dispense Refill  . albuterol (VENTOLIN HFA) 108 (90 Base) MCG/ACT inhaler Inhale 2 puffs into the lungs every 6 (six) hours as needed for wheezing or shortness of breath. 18 g 2  . ALPRAZolam (XANAX) 0.5 MG tablet TAKE (1) TABLET BY MOUTH TWICE A DAY AS NEEDED. 60 tablet 3  . citalopram (CELEXA) 40 MG tablet TAKE (1/2) TABLET BY MOUTH AT BEDTIME. 45 tablet 5  . denosumab (PROLIA) 60 MG/ML SOSY injection Inject 60 mg into the skin every 6 (six) months. 1 mL 0  . ferrous sulfate 325 (65 FE) MG tablet Take 325 mg by mouth daily with breakfast.    . fluticasone (FLONASE) 50 MCG/ACT nasal spray INSTILL 2 SPRAYS INTO BOTH NOSTRILS DAILY. 16 g 0  . levocetirizine (XYZAL) 5 MG tablet TAKE ONE TABLET BY MOUTH ONCE DAILY. 30 tablet 0  . levothyroxine (SYNTHROID) 50  MCG tablet TAKE 1 TABLET BEFORE BREAKFAST. 90 tablet 0  . Multiple Vitamin (MULTIVITAMIN WITH MINERALS) TABS tablet Take 1 tablet by mouth daily.    . ondansetron (ZOFRAN) 4 MG tablet TAKE 1 TABLET BY MOUTH 3 TIMES DAILY AS NEEDED FOR NAUSEA AND VOMITING. 90 tablet 0  . OVER THE COUNTER MEDICATION Bone Support 2-4 tabs daily    . OXcarbazepine (TRILEPTAL) 150 MG tablet TAKE (1) TABLET BY MOUTH TWICE DAILY. 60 tablet 3  . pantoprazole (PROTONIX) 40 MG tablet TAKE (1) TABLET BY MOUTH ONCE DAILY BEFORE BREAKFAST. 90 tablet 3  . predniSONE (DELTASONE) 10 MG tablet TAKE AS DIRECTED PERHPACKAGE INSTRUCTIONS.    . Probiotic Product (ALIGN PO) Take by mouth daily.    . TRELEGY ELLIPTA 100-62.5-25 MCG/INH AEPB INHALE 1 PUFF INTO LUNGS DAILY. 60 each 5   No current facility-administered medications on file prior to visit.   Allergies  Allergen Reactions  . Ciprofloxacin Hcl Hives  . Sulfonamide Derivatives Hives and Rash  . Iohexol Other (See Comments)    UNSPECIFIED REACTION  Desc: Per [facility], pt is allergic to IV contrast, no type of reaction was  available. Pt cannot remember but first reacted around 15 yrs ago from an IVP. Notes were date from 2009 at urology center., Onset Date: [id number]   . Iodinated Diagnostic Agents Rash and Itching  . Prozac [Fluoxetine Hcl] Rash   Social History   Socioeconomic History  . Marital status: Divorced    Spouse name: Not on file  . Number of children: Not on file  . Years of education: Not on file  . Highest education level: Not on file  Occupational History  . Not on file  Tobacco Use  . Smoking status: Former Smoker    Packs/day: 0.50    Years: 20.00    Pack years: 10.00    Types: Cigarettes    Quit date: 12/21/2010    Years since quitting: 9.4  . Smokeless tobacco: Never Used  . Tobacco comment: smoking cessation info given and reviewed   Vaping Use  . Vaping Use: Never used  Substance and Sexual Activity  . Alcohol use: Not Currently    Alcohol/week: 0.0 standard drinks  . Drug use: No  . Sexual activity: Yes    Birth control/protection: Surgical  Other Topics Concern  . Not on file  Social History Narrative   Divorced since 41.Lives alone with a dog.Marland KitchenRetired.  She receives Meals on Wheels.   Social Determinants of Health   Financial Resource Strain: Low Risk   . Difficulty of Paying Living Expenses: Not hard at all  Food Insecurity:   . Worried About Charity fundraiser in the Last Year: Not on file  . Ran Out of Food in the Last Year: Not on file  Transportation Needs:   . Lack of Transportation (Medical): Not on file  . Lack of Transportation (Non-Medical): Not on file  Physical Activity:   . Days of Exercise per Week: Not on file  . Minutes of Exercise per Session: Not on file  Stress:   . Feeling of Stress : Not on file  Social Connections:   . Frequency of Communication with Friends and Family: Not on file  . Frequency of Social Gatherings with Friends and Family: Not on file  . Attends Religious Services: Not on file  . Active Member of Clubs or  Organizations: Not on file  . Attends Archivist Meetings: Not on file  . Marital  Status: Not on file  Intimate Partner Violence:   . Fear of Current or Ex-Partner: Not on file  . Emotionally Abused: Not on file  . Physically Abused: Not on file  . Sexually Abused: Not on file      Review of Systems  All other systems reviewed and are negative.      Objective:   Physical Exam        Assessment & Plan:  Right hip pain  Given meloxicam 7.5 mg daily.  Watch for any GI toxicity particularly if she starts any type of systemic chemotherapy as she has completed radiation therapy at this time.  I have cautioned the patient that if she develops any stomach upset she should stop the medication immediately and only use the medication sparingly as needed for the hip pain.

## 2020-05-30 ENCOUNTER — Other Ambulatory Visit: Payer: Self-pay | Admitting: Family Medicine

## 2020-05-30 ENCOUNTER — Telehealth: Payer: Self-pay | Admitting: Hematology and Oncology

## 2020-05-30 NOTE — Telephone Encounter (Signed)
R/s 8/25 appt. Called and spoke with patient. Confirmed new date and time

## 2020-06-01 ENCOUNTER — Inpatient Hospital Stay: Payer: PPO

## 2020-06-01 ENCOUNTER — Inpatient Hospital Stay: Payer: PPO | Admitting: Hematology and Oncology

## 2020-06-01 DIAGNOSIS — J449 Chronic obstructive pulmonary disease, unspecified: Secondary | ICD-10-CM | POA: Diagnosis not present

## 2020-06-01 DIAGNOSIS — C3490 Malignant neoplasm of unspecified part of unspecified bronchus or lung: Secondary | ICD-10-CM | POA: Diagnosis not present

## 2020-06-01 DIAGNOSIS — R0902 Hypoxemia: Secondary | ICD-10-CM | POA: Diagnosis not present

## 2020-06-01 DIAGNOSIS — J9621 Acute and chronic respiratory failure with hypoxia: Secondary | ICD-10-CM | POA: Diagnosis not present

## 2020-06-02 ENCOUNTER — Other Ambulatory Visit: Payer: Self-pay | Admitting: *Deleted

## 2020-06-02 MED ORDER — PROLIA 60 MG/ML ~~LOC~~ SOSY: 60.0000 mg | PREFILLED_SYRINGE | SUBCUTANEOUS | 0 refills | Status: DC

## 2020-06-07 ENCOUNTER — Encounter: Payer: Self-pay | Admitting: Family Medicine

## 2020-06-14 ENCOUNTER — Telehealth: Payer: Self-pay | Admitting: Pharmacist

## 2020-06-14 NOTE — Progress Notes (Unsigned)
Chronic Care Management Pharmacy Assistant   Name: [name]  MRN: [id number] DOB: May 09, 1941  Reason for Encounter: Disease State  Patient Questions: 1. Have you seen any other providers since your last visit? Yes, 05/12/20 - Freeman Caldron, PA-C/ Kyung Rudd, MD (Radiology Oncology, 05/20/20- Tyler Pita, MD (Radiology Oncology), 05/26/20- Cletus Gash T. Pickard (PCP- Telehealth visit) 2. Any changes in your medicines or health? {CHL THN UPSTREAM YES/NO:24149} 3. Does the patient have any questions or concerns for [name], CPP?  PCP : Doree Albee, MD  Allergies:   Allergies  Allergen Reactions  . Ciprofloxacin Hcl Hives  . Sulfonamide Derivatives Hives and Rash  . Iohexol Other (See Comments)    UNSPECIFIED REACTION  Desc: [facility], pt is allergic to IV contrast, no type of reaction was available. Pt cannot remember but first reacted around 15 yrs ago from an IVP. Notes were date from 2009 at urology center., Onset Date: [id number]   . Iodinated Diagnostic Agents Rash and Itching  . Prozac [Fluoxetine Hcl] Rash    Medications: Outpatient Encounter Medications as of 06/14/2020  Medication Sig  . albuterol (VENTOLIN HFA) 108 (90 Base) MCG/ACT inhaler Inhale 2 puffs into the lungs every 6 (six) hours as needed for wheezing or shortness of breath.  . ALPRAZolam (XANAX) 0.5 MG tablet TAKE (1) TABLET BY MOUTH TWICE A DAY AS NEEDED.  . citalopram (CELEXA) 40 MG tablet TAKE (1/2) TABLET BY MOUTH AT BEDTIME.  Marland Kitchen denosumab (PROLIA) 60 MG/ML SOSY injection Inject 60 mg into the skin every 6 (six) months.  . ferrous sulfate 325 (65 FE) MG tablet Take 325 mg by mouth daily with breakfast.  . fluticasone (FLONASE) 50 MCG/ACT nasal spray INSTILL 2 SPRAYS INTO BOTH NOSTRILS DAILY.  Marland Kitchen levocetirizine (XYZAL) 5 MG tablet TAKE ONE TABLET BY MOUTH ONCE DAILY.  Marland Kitchen levothyroxine (SYNTHROID) 50 MCG tablet TAKE 1 TABLET BEFORE BREAKFAST.  . meloxicam (MOBIC) 7.5 MG tablet Take 1  tablet (7.5 mg total) by mouth daily.  . Multiple Vitamin (MULTIVITAMIN WITH MINERALS) TABS tablet Take 1 tablet by mouth daily.  . ondansetron (ZOFRAN) 4 MG tablet TAKE 1 TABLET BY MOUTH 3 TIMES DAILY AS NEEDED FOR NAUSEA AND VOMITING.  . OVER THE COUNTER MEDICATION Bone Support 2-4 tabs daily  . OXcarbazepine (TRILEPTAL) 150 MG tablet TAKE (1) TABLET BY MOUTH TWICE DAILY.  . pantoprazole (PROTONIX) 40 MG tablet TAKE (1) TABLET BY MOUTH ONCE DAILY BEFORE BREAKFAST.  Marland Kitchen predniSONE (DELTASONE) 10 MG tablet TAKE AS DIRECTED PERHPACKAGE INSTRUCTIONS.  . Probiotic Product (ALIGN PO) Take by mouth daily.  . TRELEGY ELLIPTA 100-62.5-25 MCG/INH AEPB INHALE 1 PUFF INTO LUNGS DAILY.   No facility-administered encounter medications on file as of 06/14/2020.    Current Diagnosis: Patient Active Problem List   Diagnosis Date Noted  . Abdominal bloating 01/13/2020  . Primary cancer of left lower lobe of lung ([place]) 12/29/2019  . Flatulence 11/17/2019  . IBS (irritable bowel syndrome)   . Acute hypoxemic respiratory failure ([place]) 05/25/2019  . Pharyngeal dysphagia 05/07/2019  . Brain metastasis ([place]) 04/17/2019  . Brain tumor ([place]) 03/18/2019  . Essential hypertension 03/12/2019  . Brain metastases ([place]) 03/06/2019  . Cytotoxic brain edema ([place]) 03/04/2019  . ICH (intracerebral hemorrhage) ([facility]) - L parietal lobe 03/03/2019  . Nontraumatic intracerebral hemorrhage, unspecified ([place]) 03/03/2019  . Osteoporosis 05/16/2018  . S/P ORIF (open reduction internal fixation) fracture right patella 03/18/18 03/26/2018  . Other specified postprocedural states 03/26/2018  . Cellulitis,  wound, post-operative 03/22/2018  . Fracture of right patella 03/18/2018  . Normocytic anemia   . Esophageal dysphagia 11/27/2017  . Heme positive stool 11/27/2017  . Nasal crusting 02/06/2017  . Iron deficiency anemia 12/05/2016  . SIADH (syndrome of inappropriate ADH production) ([place])   . Polypharmacy 09/14/2016  . Muscle  weakness (generalized)   . Diastolic dysfunction 53/64/6803  . Syncope 09/10/2016  . Rib pain on left side   . Hypersomnia 02/16/2016  . Acute bronchitis 02/16/2016  . Exertional dyspnea 12/20/2015  . HLD (hyperlipidemia) 11/23/2014  . Change in bowel habits 11/10/2014  . Abdominal pain, left lower quadrant 11/10/2014  . Emphysema lung ([place]) 11/08/2014  . Helicobacter pylori gastritis 07/30/2014  . Acute on chronic respiratory failure with hypoxia ([place]) 04/10/2014  . Wrist fracture 04/09/2014  . 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 T12 burst fracture ([place]) 01/13/2014  . Constipation 12/14/2013  . Acute respiratory failure with hypoxia ([place]) 12/14/2013  . COPD (chronic obstructive pulmonary disease) ([place]) 12/14/2013  . Compression fracture of thoracic vertebra ([place]) 12/14/2013  . Hypoxia 12/13/2013  . Compression fracture 12/13/2013  . 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[place]) 08/11/2013  . AAA (abdominal aortic aneurysm) without rupture ([place]) 02/26/2012  . Dyspepsia 02/11/2012  . Aneurysm ([place]) 02/11/2012  . Trigeminal neuralgia   . Epigastric pain 02/21/2011  . Dysphagia 02/21/2011  . Adenocarcinoma of lung ([place]) 12/07/2010  . HELICOBACTER PYLORI GASTRITIS, HX OF 12/30/2009  . Radiographic dye allergy status 12/30/2009  . Personal history of other diseases of the digestive system 12/30/2009    Goals Addressed   None     Follow-Up:  Pharmacist Review   Reviewed chart prior to disease state call. Spoke with patient regarding BP  Recent Office Vitals: BP Readings from Last 3 Encounters:  05/20/20 126/85  05/12/20 119/88  05/05/20 114/71   Pulse Readings from Last 3 Encounters:  05/20/20 64  05/12/20 (!) 105  05/05/20 69    Wt Readings from Last 3 Encounters:  05/05/20 110 lb (49.9 kg)  04/18/20 110 lb 3.2 oz (50 kg)  03/30/20 116 lb (52.6 kg)     Kidney Function Lab Results  Component Value Date/Time   CREATININE 0.46  05/11/2020 12:37 PM   CREATININE 0.45 03/30/2020 11:44 AM   CREATININE 0.83 01/26/2020 04:23 PM   CREATININE 0.92 11/27/2019 02:48 PM   GFR 107.85 02/16/2016 11:50 AM   GFRNONAA >60 05/11/2020 12:37 PM   GFRNONAA 68 01/26/2020 04:23 PM   GFRAA >60 05/11/2020 12:37 PM   GFRAA 78 01/26/2020 04:23 PM    BMP Latest Ref Rng & Units 05/11/2020 03/30/2020 03/15/2020  Glucose 70 - 99 mg/dL - 112(H) 94  BUN 8 - 23 mg/dL 19 16 21   Creatinine 0.44 - 1.00 mg/dL 0.46 0.45 0.42(L)  BUN/Creat Ratio 6 - 22 (calc) - - -  Sodium 135 - 145 mmol/L - 134(L) 135  Potassium 3.5 - 5.1 mmol/L - 4.2 4.5  Chloride 98 - 111 mmol/L - 92(L) 96(L)  CO2 22 - 32 mmol/L - 31 28  Calcium 8.9 - 10.3 mg/dL - 9.6 9.7    . Current antihypertensive regimen:  o *** . How often are you checking your Blood Pressure? {CHL HP BP Monitoring Frequency:[id number]} . Current home BP readings: *** . What recent interventions/DTPs have been made by any provider to improve Blood Pressure control since last CPP Visit: *** . Any recent hospitalizations or ED visits since last visit with CPP? {  yes/no:20286} . What diet changes have been made to improve Blood Pressure Control?  o *** . What exercise is being done to improve your Blood Pressure Control?  o ***  Adherence Review: Is the patient currently on ACE/ARB medication? {yes/no:20286} Does the patient have >5 day gap between last estimated fill dates? {yes/no:20286}   Maren Reamer

## 2020-06-16 ENCOUNTER — Other Ambulatory Visit: Payer: PPO

## 2020-06-16 ENCOUNTER — Ambulatory Visit: Payer: PPO | Admitting: Hematology and Oncology

## 2020-06-17 ENCOUNTER — Encounter: Payer: Self-pay | Admitting: Family Medicine

## 2020-06-17 ENCOUNTER — Other Ambulatory Visit: Payer: Self-pay | Admitting: Family Medicine

## 2020-06-17 ENCOUNTER — Telehealth: Payer: Self-pay | Admitting: Family Medicine

## 2020-06-17 ENCOUNTER — Telehealth: Payer: Self-pay | Admitting: *Deleted

## 2020-06-17 MED ORDER — CEPHALEXIN 500 MG PO CAPS: 500.0000 mg | ORAL_CAPSULE | Freq: Three times a day (TID) | ORAL | 0 refills | Status: DC

## 2020-06-17 NOTE — Telephone Encounter (Signed)
ok 

## 2020-06-17 NOTE — Telephone Encounter (Signed)
Please advise 

## 2020-06-17 NOTE — Telephone Encounter (Signed)
Cb# [phone] Would like to drop off urine  Sample.She has a UTI

## 2020-06-17 NOTE — Telephone Encounter (Signed)
CALLED PATIENT TO ASK ABOUT ALTERING FU ON 06-23-20 DUE TO CHANGE IN ASHLYN'S SCHEDULE , PATIENT AGREED FOR ASHLYN TO CALL HER @ 10 AM

## 2020-06-19 ENCOUNTER — Encounter: Payer: Self-pay | Admitting: Family Medicine

## 2020-06-20 ENCOUNTER — Ambulatory Visit: Payer: Self-pay

## 2020-06-20 ENCOUNTER — Other Ambulatory Visit: Payer: Self-pay

## 2020-06-20 ENCOUNTER — Inpatient Hospital Stay (HOSPITAL_COMMUNITY): Payer: PPO | Attending: Hematology

## 2020-06-20 ENCOUNTER — Ambulatory Visit (HOSPITAL_COMMUNITY)
Admission: RE | Admit: 2020-06-20 | Discharge: 2020-06-20 | Disposition: A | Discharge status: Home / Self Care | Payer: PPO | Source: Ambulatory Visit | Attending: Hematology | Admitting: Hematology

## 2020-06-20 ENCOUNTER — Other Ambulatory Visit (HOSPITAL_COMMUNITY): Payer: Self-pay

## 2020-06-20 DIAGNOSIS — M549 Dorsalgia, unspecified: Secondary | ICD-10-CM | POA: Insufficient documentation

## 2020-06-20 DIAGNOSIS — Z79899 Other long term (current) drug therapy: Secondary | ICD-10-CM | POA: Insufficient documentation

## 2020-06-20 DIAGNOSIS — R0602 Shortness of breath: Secondary | ICD-10-CM | POA: Diagnosis not present

## 2020-06-20 DIAGNOSIS — D509 Iron deficiency anemia, unspecified: Secondary | ICD-10-CM | POA: Diagnosis not present

## 2020-06-20 DIAGNOSIS — M1990 Unspecified osteoarthritis, unspecified site: Secondary | ICD-10-CM | POA: Insufficient documentation

## 2020-06-20 DIAGNOSIS — C3432 Malignant neoplasm of lower lobe, left bronchus or lung: Secondary | ICD-10-CM | POA: Insufficient documentation

## 2020-06-20 DIAGNOSIS — R002 Palpitations: Secondary | ICD-10-CM | POA: Diagnosis not present

## 2020-06-20 DIAGNOSIS — R42 Dizziness and giddiness: Secondary | ICD-10-CM | POA: Diagnosis not present

## 2020-06-20 DIAGNOSIS — F419 Anxiety disorder, unspecified: Secondary | ICD-10-CM | POA: Diagnosis not present

## 2020-06-20 DIAGNOSIS — R5383 Other fatigue: Secondary | ICD-10-CM | POA: Insufficient documentation

## 2020-06-20 DIAGNOSIS — C7931 Secondary malignant neoplasm of brain: Secondary | ICD-10-CM | POA: Insufficient documentation

## 2020-06-20 DIAGNOSIS — E039 Hypothyroidism, unspecified: Secondary | ICD-10-CM | POA: Diagnosis not present

## 2020-06-20 DIAGNOSIS — Z87891 Personal history of nicotine dependence: Secondary | ICD-10-CM | POA: Diagnosis not present

## 2020-06-20 DIAGNOSIS — R05 Cough: Secondary | ICD-10-CM | POA: Insufficient documentation

## 2020-06-20 DIAGNOSIS — C3492 Malignant neoplasm of unspecified part of left bronchus or lung: Secondary | ICD-10-CM

## 2020-06-20 DIAGNOSIS — E222 Syndrome of inappropriate secretion of antidiuretic hormone: Secondary | ICD-10-CM | POA: Insufficient documentation

## 2020-06-20 DIAGNOSIS — J449 Chronic obstructive pulmonary disease, unspecified: Secondary | ICD-10-CM | POA: Diagnosis not present

## 2020-06-20 LAB — CBC WITH DIFFERENTIAL/PLATELET
Abs Immature Granulocytes: 0.02 10*3/uL (ref 0.00–0.07)
Basophils Absolute: 0 10*3/uL (ref 0.0–0.1)
Basophils Relative: 1 %
Eosinophils Absolute: 0.1 10*3/uL (ref 0.0–0.5)
Eosinophils Relative: 2 %
HCT: 35.6 % — ABNORMAL LOW (ref 36.0–46.0)
Hemoglobin: 11.6 g/dL — ABNORMAL LOW (ref 12.0–15.0)
Immature Granulocytes: 0 %
Lymphocytes Relative: 15 %
Lymphs Abs: 1 10*3/uL (ref 0.7–4.0)
MCH: 29.9 pg (ref 26.0–34.0)
MCHC: 32.6 g/dL (ref 30.0–36.0)
MCV: 91.8 fL (ref 80.0–100.0)
Monocytes Absolute: 0.5 10*3/uL (ref 0.1–1.0)
Monocytes Relative: 8 %
Neutro Abs: 4.9 10*3/uL (ref 1.7–7.7)
Neutrophils Relative %: 74 %
Platelets: 267 10*3/uL (ref 150–400)
RBC: 3.88 MIL/uL (ref 3.87–5.11)
RDW: 12.9 % (ref 11.5–15.5)
WBC: 6.6 10*3/uL (ref 4.0–10.5)
nRBC: 0 % (ref 0.0–0.2)

## 2020-06-20 LAB — COMPREHENSIVE METABOLIC PANEL
ALT: 17 U/L (ref 0–44)
AST: 16 U/L (ref 15–41)
Albumin: 4.2 g/dL (ref 3.5–5.0)
Alkaline Phosphatase: 40 U/L (ref 38–126)
Anion gap: 7 (ref 5–15)
BUN: 25 mg/dL — ABNORMAL HIGH (ref 8–23)
CO2: 30 mmol/L (ref 22–32)
Calcium: 10.2 mg/dL (ref 8.9–10.3)
Chloride: 95 mmol/L — ABNORMAL LOW (ref 98–111)
Creatinine, Ser: 0.45 mg/dL (ref 0.44–1.00)
GFR calc Af Amer: >60 mL/min (ref >60)
GFR calc non Af Amer: >60 mL/min (ref >60)
Glucose, Bld: 95 mg/dL (ref 70–99)
Potassium: 4.6 mmol/L (ref 3.5–5.1)
Sodium: 132 mmol/L — ABNORMAL LOW (ref 135–145)
Total Bilirubin: 0.7 mg/dL (ref 0.3–1.2)
Total Protein: 6.3 g/dL — ABNORMAL LOW (ref 6.5–8.1)

## 2020-06-20 MED ORDER — PREDNISONE 50 MG PO TABS
ORAL_TABLET | ORAL | 0 refills | Status: DC
Start: 2020-06-20 — End: 2020-08-29

## 2020-06-20 MED ORDER — DIPHENHYDRAMINE HCL 50 MG PO TABS
50.0000 mg | ORAL_TABLET | Freq: Once | ORAL | 0 refills | Status: DC
Start: 2020-06-20 — End: 2022-05-23

## 2020-06-22 ENCOUNTER — Other Ambulatory Visit: Payer: Self-pay

## 2020-06-22 ENCOUNTER — Inpatient Hospital Stay (HOSPITAL_COMMUNITY): Payer: PPO | Admitting: Hematology

## 2020-06-22 ENCOUNTER — Ambulatory Visit (HOSPITAL_COMMUNITY)
Admission: RE | Admit: 2020-06-22 | Discharge: 2020-06-22 | Disposition: A | Discharge status: Home / Self Care | Payer: PPO | Source: Ambulatory Visit | Attending: Hematology | Admitting: Hematology

## 2020-06-22 VITALS — BP 120/71 | HR 102 | Temp 97.3°F | Resp 20 | Wt 108.3 lb

## 2020-06-22 DIAGNOSIS — I77811 Abdominal aortic ectasia: Secondary | ICD-10-CM | POA: Diagnosis not present

## 2020-06-22 DIAGNOSIS — I700 Atherosclerosis of aorta: Secondary | ICD-10-CM | POA: Diagnosis not present

## 2020-06-22 DIAGNOSIS — C3492 Malignant neoplasm of unspecified part of left bronchus or lung: Secondary | ICD-10-CM | POA: Insufficient documentation

## 2020-06-22 DIAGNOSIS — J439 Emphysema, unspecified: Secondary | ICD-10-CM | POA: Diagnosis not present

## 2020-06-22 DIAGNOSIS — J984 Other disorders of lung: Secondary | ICD-10-CM | POA: Diagnosis not present

## 2020-06-22 DIAGNOSIS — C3432 Malignant neoplasm of lower lobe, left bronchus or lung: Secondary | ICD-10-CM | POA: Diagnosis not present

## 2020-06-22 MED ORDER — IOHEXOL 300 MG/ML  SOLN
75.0000 mL | Freq: Once | INTRAMUSCULAR | Status: AC | PRN
  Administered 2020-06-22: 75 mL via INTRAVENOUS

## 2020-06-22 NOTE — Progress Notes (Signed)
[name], [name] [id number]   CLINIC:  Medical Oncology/Hematology  PCP:  [name], MD [address] [place] [address] [phone]   REASON FOR VISIT:  Follow-up for metastatic left lung cancer and iron deficiency state  PRIOR THERAPY: Left lower lobe resection on 12/21/2010  NGS Results: PD-L1 TPS 1%, Foundation 1 EGFR positive, MS--stable, 10 Muts/Mb  CURRENT THERAPY: Observation and intermittent iron infusions  BRIEF ONCOLOGIC HISTORY:  Oncology History  Adenocarcinoma of lung ([place])  12/21/2010 Initial Diagnosis   S/P left lower lobe resection for a well-differentiated Stage IB bronchoalveolar cancer. 0/14 lymph nodes.  No perineural invasion or LVI.  negative margins.   06/05/2016 Imaging   Although the previously described ground-glass attenuation nodule in the superior segment of the right lower lobe measures slightly larger on today's examination than prior studies, this is favored to be technique related. Today's study should serve as a baseline for future followup examinations. At this time, there is no central solid component to this lesion. No new nodules are noted. 2. Stable postoperative scarring in the left lower lobe and post infectious scarring throughout the visualize lung bases, as above. 3. Areas of cylindrical bronchiectasis throughout the lung bases bilaterally    05/09/2017 Imaging   IMPRESSION: 1. Status post left lower lobe wedge resection. No findings to suggest local recurrence of disease or definite metastatic disease in the thorax. Previously noted ground-glass attenuation nodule in the superior segment of the right lower lobe appears slightly smaller than the prior study, suggesting a benign etiology. 2. Diffuse bronchial wall thickening with moderate to severe centrilobular and paraseptal emphysema; imaging findings suggestive of underlying COPD. 3. Aortic atherosclerosis, in  addition to left main and left circumflex coronary artery disease. Assessment for potential risk factor modification, dietary therapy or pharmacologic therapy may be warranted, if clinically indicated. 4. Increasing anterior pericardial fluid and/or thickening. This is unlikely to be of hemodynamic significance at this time. No associated pericardial calcification. 5. New (but non acute) compression fracture of T6 with approximately 90% loss of anterior vertebral body height, in addition to multiple other chronic compression fractures of the visualize the thoracolumbar spine.   03/22/2020 Genetic Testing   PD-L1     03/29/2020 Genetic Testing   Foundation One       CANCER STAGING: Cancer Staging Adenocarcinoma of lung [facility]) Staging form: Lung, AJCC 7th Edition - Clinical: Stage IB - Signed by Baird Cancer, PA on 08/01/2012   INTERVAL HISTORY:  [name], a 79 y.o. female, returns for routine follow-up of her metastatic left lung cancer and iron deficiency state. [name] was last seen on 03/15/2020.  Today she reports that she has been feeling rough and fatigued after the radiation. She complains of having pain in her left lower abdomen and reports being constipated, but denies diarrhea. She has occasional palpitations which is most likely due to her anxiety.  She has limited mobility at home and reports that it takes her 1 hour to go to the bathroom. She has not cooked since her brain surgery. She is on oxygen via [place].   REVIEW OF SYSTEMS:  Review of Systems  Constitutional: Positive for appetite change (mildly decreased) and fatigue (moderate).  HENT:   Positive for trouble swallowing.   Respiratory: Positive for cough (productive w/ yellow sputum) and shortness of breath.   Cardiovascular: Positive for chest pain and palpitations.  Gastrointestinal: Positive for abdominal  pain (LLQ pain) and constipation. Negative for diarrhea.  Genitourinary: Positive for  difficulty urinating (recent UTI).   Musculoskeletal: Positive for back pain (5/10 back and hip pain).  Neurological: Positive for dizziness and numbness (R leg).  Psychiatric/Behavioral: Positive for sleep disturbance. The patient is nervous/anxious.   All other systems reviewed and are negative.   PAST MEDICAL/SURGICAL HISTORY:  Past Medical History:  Diagnosis Date  . Adenocarcinoma of lung ([place]) 12/2010   left lung/surg only  . Allergic rhinitis   . 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 Anxiety disorder   . Aortic aneurysm ([place])   . Brain metastasis ([place])    lung cancer s/p resection 7/20  . Cancer ([place])    Phreesia 05/26/2020  . Complication of anesthesia 2011   bloodpressure dropped during colonoscopy, not problems since  . Compression fracture   . COPD (chronic obstructive pulmonary disease) ([place])   . Depression   . Diastolic dysfunction   . Diverticulosis   . Dysphagia   . Emphysema lung ([place]) 11/08/2014  . Headache   . Hiatal hernia   . History of kidney stones   . Hypothyroidism   . IBS (irritable bowel syndrome)   . Iron deficiency anemia due to chronic blood loss 12/05/2016  . On home O2 12/15/2013   chronic hypoxia  . Pneumonia   . PUD (peptic ulcer disease)    34yrs  . Shortness of breath    with exertion  . SIADH (syndrome of inappropriate ADH production) ([place])   . Trigeminal neuralgia    Past Surgical History:  Procedure Laterality Date  . ABDOMINAL HYSTERECTOMY    . APPLICATION OF CRANIAL NAVIGATION N/A 03/18/2019   Procedure: APPLICATION OF CRANIAL NAVIGATION;  Surgeon: Consuella Lose, MD;  Location: [facility];  Service: Neurosurgery;  Laterality: N/A;  . BACK SURGERY  January 13, 2014  . BRAIN SURGERY N/A    Phreesia 03/28/2020  . CATARACT EXTRACTION    . CATARACT EXTRACTION W/PHACO  09/02/2012   Procedure: CATARACT EXTRACTION PHACO AND INTRAOCULAR LENS PLACEMENT (IOC);  Surgeon: Elta Guadeloupe T. Gershon Crane, MD;  [facility];   Service: Ophthalmology;  Laterality: Left;  CDE:10.35  . CHOLECYSTECTOMY    . 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 COLONOSCOPY N/A 11/23/2014   Dr. Oneida Alar: moderate diverticulosis, hemorrhoids, redundant colon. next TCS in 10-15 years.   . CRANIOTOMY Left 03/18/2019   Procedure: Left stereotactic craniotomy for tumor resection;  Surgeon: Consuella Lose, MD;  Location: [facility];  Service: Neurosurgery;  Laterality: Left;  Left stereotactic craniotomy for tumor resection  . ENDARTERECTOMY Right 08/21/2013   Procedure: RIGHT CAROTID ANEURYSM RESECTION;  Surgeon: Rosetta Posner, MD;  Location: [facility];  Service: Vascular;  Laterality: Right;  . ESOPHAGEAL DILATION  02/24/2018   Procedure: ESOPHAGEAL DILATION;  Surgeon: Danie Binder, MD;  [facility];  Service: Endoscopy;;  . ESOPHAGOGASTRODUODENOSCOPY  11/13/2009   w/dilation to 28mm, gastric ulceration (H.Pylori) s/p treatment  . ESOPHAGOGASTRODUODENOSCOPY  11/21/2009   distal esophageal web, gastritis  . ESOPHAGOGASTRODUODENOSCOPY  03/14/12   XVQ:[id number] in the distal esophagus/Mild gastritis/small HH. + H.pylori, prescribed Pylera. Finished treatment.   . ESOPHAGOGASTRODUODENOSCOPY N/A 12/13/2017   Dr. Oneida Alar: esophageal web s/p dilation, gastritis, no h.pylori  . ESOPHAGOGASTRODUODENOSCOPY N/A 02/24/2018   Dr. Oneida Alar: web in prox esophagus and in distal esophagus s/p dilation, mild gastritis, mild pyloric stenosis, mild post ulcer duodenal deformity at D1/D2  .  fatty tumor removal from lt groin    . FRACTURE SURGERY Left    hand and left ring finger  . GIVENS CAPSULE STUDY N/A 12/26/2017   incomplete  . GIVENS CAPSULE STUDY N/A 02/24/2018   normal small bowel  . KNEE SURGERY     patella tendon repair june 2019 [facility]  . LUNG CANCER SURGERY  12/2010   Left VATS, minithoracotomy, LLL superior segmentectomy  . ORIF PATELLA Right 03/18/2018   Procedure: OPEN REDUCTION INTERNAL (ORIF) FIXATION  RIGHT PATELLA;  Surgeon: Carole Civil, MD;  [facility];  Service: Orthopedics;  Laterality: Right;  . ORIF WRIST FRACTURE Left 04/09/2014   Procedure: OPEN REDUCTION INTERNAL FIXATION (ORIF) WRIST FRACTURE;  Surgeon: Renette Butters, MD;  Location: [facility];  Service: Orthopedics;  Laterality: Left;  . PERCUTANEOUS PINNING Left 04/09/2014   Procedure: PERCUTANEOUS PINNING EXTREMITY;  Surgeon: Renette Butters, MD;  Location: [facility];  Service: Orthopedics;  Laterality: Left;  . SAVORY DILATION N/A 12/13/2017   Procedure: SAVORY DILATION;  Surgeon: Danie Binder, MD;  [facility];  Service: Endoscopy;  Laterality: N/A;  . TUBAL LIGATION    . vocal cord surgery  02/06/2011   laryngoscopy with bilateral vocal cord Radiesse injection for vocal cord paralysis  . YAG LASER APPLICATION Left 19/14/7829   Procedure: YAG LASER APPLICATION;  Surgeon: Rutherford Guys, MD;  [facility];  Service: Ophthalmology;  Laterality: Left;    SOCIAL HISTORY:  Social History   Socioeconomic History  . Marital status: Divorced    Spouse name: Not on file  . Number of children: Not on file  . Years of education: Not on file  . Highest education level: Not on file  Occupational History  . Not on file  Tobacco Use  . Smoking status: Former Smoker    Packs/day: 0.50    Years: 20.00    Pack years: 10.00    Types: Cigarettes    Quit date: 12/21/2010    Years since quitting: 9.5  . Smokeless tobacco: Never Used  . Tobacco comment: smoking cessation info given and reviewed   Vaping Use  . Vaping Use: Never used  Substance and Sexual Activity  . Alcohol use: Not Currently    Alcohol/week: 0.0 standard drinks  . Drug use: No  . Sexual activity: Yes    Birth control/protection: Surgical  Other Topics Concern  . Not on file  Social History Narrative   Divorced since 34.Lives alone with a dog.Marland KitchenRetired.  She receives Meals on Wheels.   Social Determinants of Health   Financial Resource  Strain: Low Risk   . Difficulty of Paying Living Expenses: Not hard at all  Food Insecurity:   . Worried About Charity fundraiser in the Last Year: Not on file  . Ran Out of Food in the Last Year: Not on file  Transportation Needs:   . Lack of Transportation (Medical): Not on file  . Lack of Transportation (Non-Medical): Not on file  Physical Activity:   . Days of Exercise per Week: Not on file  . Minutes of Exercise per Session: Not on file  Stress:   . Feeling of Stress : Not on file  Social Connections:   . Frequency of Communication with Friends and Family: Not on file  . Frequency of Social Gatherings with Friends and Family: Not on file  . Attends Religious Services: Not on file  . Active Member of Clubs or Organizations: Not on file  .  Attends Archivist Meetings: Not on file  . Marital Status: Not on file  Intimate Partner Violence:   . Fear of Current or Ex-Partner: Not on file  . Emotionally Abused: Not on file  . Physically Abused: Not on file  . Sexually Abused: Not on file    FAMILY HISTORY:  Family History  Problem Relation Age of Onset  . Pulmonary embolism Mother   . Heart attack Father   . Hypertension Father   . Liver cancer Sister   . Cancer Sister   . Cancer Brother   . Cancer Daughter   . Breast cancer Daughter   . Colon cancer Neg Hx     CURRENT MEDICATIONS:  Current Outpatient Medications  Medication Sig Dispense Refill  . albuterol (VENTOLIN HFA) 108 (90 Base) MCG/ACT inhaler Inhale 2 puffs into the lungs every 6 (six) hours as needed for wheezing or shortness of breath. 18 g 2  . ALPRAZolam (XANAX) 0.5 MG tablet TAKE (1) TABLET BY MOUTH TWICE A DAY AS NEEDED. 60 tablet 3  . cephALEXin (KEFLEX) 500 MG capsule Take 1 capsule (500 mg total) by mouth 3 (three) times daily. 21 capsule 0  . citalopram (CELEXA) 40 MG tablet TAKE (1/2) TABLET BY MOUTH AT BEDTIME. 45 tablet 5  . denosumab (PROLIA) 60 MG/ML SOSY injection Inject 60 mg into the  skin every 6 (six) months. 1 mL 0  . ferrous sulfate 325 (65 FE) MG tablet Take 325 mg by mouth daily with breakfast.    . fluticasone (FLONASE) 50 MCG/ACT nasal spray INSTILL 2 SPRAYS INTO BOTH NOSTRILS DAILY. 16 g 0  . levocetirizine (XYZAL) 5 MG tablet TAKE ONE TABLET BY MOUTH ONCE DAILY. 30 tablet 0  . levothyroxine (SYNTHROID) 50 MCG tablet TAKE 1 TABLET BEFORE BREAKFAST. 90 tablet 0  . meloxicam (MOBIC) 7.5 MG tablet Take 1 tablet (7.5 mg total) by mouth daily. 30 tablet 0  . Multiple Vitamin (MULTIVITAMIN WITH MINERALS) TABS tablet Take 1 tablet by mouth daily.    . ondansetron (ZOFRAN) 4 MG tablet TAKE 1 TABLET BY MOUTH 3 TIMES DAILY AS NEEDED FOR NAUSEA AND VOMITING. 90 tablet 0  . OVER THE COUNTER MEDICATION Bone Support 2-4 tabs daily    . OXcarbazepine (TRILEPTAL) 150 MG tablet TAKE (1) TABLET BY MOUTH TWICE DAILY. 60 tablet 3  . pantoprazole (PROTONIX) 40 MG tablet TAKE (1) TABLET BY MOUTH ONCE DAILY BEFORE BREAKFAST. 90 tablet 3  . predniSONE (DELTASONE) 10 MG tablet TAKE AS DIRECTED PERHPACKAGE INSTRUCTIONS.    Marland Kitchen predniSONE (DELTASONE) 50 MG tablet Please take one tablet at 13 hours, 7 hours and one hour prior to CT scan 3 tablet 0  . Probiotic Product (ALIGN PO) Take by mouth daily.    . TRELEGY ELLIPTA 100-62.5-25 MCG/INH AEPB INHALE 1 PUFF INTO LUNGS DAILY. 60 each 0  . diphenhydrAMINE (BENADRYL) 50 MG tablet Take 1 tablet (50 mg total) by mouth once for 1 dose. Take one hour prior to CT scan 1 tablet 0   No current facility-administered medications for this visit.    ALLERGIES:  Allergies  Allergen Reactions  . Ciprofloxacin Hcl Hives  . Sulfonamide Derivatives Hives and Rash  . Iohexol Other (See Comments)    UNSPECIFIED REACTION  Desc: Per [facility], pt is allergic to IV contrast, no type of reaction was available. Pt cannot remember but first reacted around 15 yrs ago from an IVP. Notes were date from 2009 at urology center., Onset Date: [id number]   .  Iodinated Diagnostic Agents Rash and Itching  . Prozac [Fluoxetine Hcl] Rash    PHYSICAL EXAM:  Performance status (ECOG): 1 - Symptomatic but completely ambulatory  Vitals:   06/22/20 1128  BP: 120/71  Pulse: (!) 102  Resp: 20  Temp: (!) 97.3 F (36.3 C)  SpO2: 94%   Wt Readings from Last 3 Encounters:  06/22/20 108 lb 4.8 oz (49.1 kg)  05/05/20 110 lb (49.9 kg)  04/18/20 110 lb 3.2 oz (50 kg)   Physical Exam Vitals reviewed.  Constitutional:      Appearance: Normal appearance.     Interventions: Nasal cannula in place.  Cardiovascular:     Rate and Rhythm: Normal rate and regular rhythm.     Pulses: Normal pulses.     Heart sounds: Normal heart sounds.  Pulmonary:     Effort: Pulmonary effort is normal.     Breath sounds: Normal breath sounds.  Neurological:     General: No focal deficit present.     Mental Status: She is alert and oriented to person, place, and time.  Psychiatric:        Mood and Affect: Mood normal.        Behavior: Behavior normal.      LABORATORY DATA:  I have reviewed the labs as listed.  CBC Latest Ref Rng & Units 06/20/2020 03/30/2020 03/15/2020  WBC 4.0 - 10.5 K/uL 6.6 9.5 7.2  Hemoglobin 12.0 - 15.0 g/dL 11.6(L) 12.8 11.7(L)  Hematocrit 36 - 46 % 35.6(L) 39.2 36.3  Platelets 150 - 400 K/uL 267 370 322   CMP Latest Ref Rng & Units 06/20/2020 05/11/2020 03/30/2020  Glucose 70 - 99 mg/dL 95 - 112(H)  BUN 8 - 23 mg/dL 25(H) 19 16  Creatinine 0.44 - 1.00 mg/dL 0.45 0.46 0.45  Sodium 135 - 145 mmol/L 132(L) - 134(L)  Potassium 3.5 - 5.1 mmol/L 4.6 - 4.2  Chloride 98 - 111 mmol/L 95(L) - 92(L)  CO2 22 - 32 mmol/L 30 - 31  Calcium 8.9 - 10.3 mg/dL 10.2 - 9.6  Total Protein 6.5 - 8.1 g/dL 6.3(L) - -  Total Bilirubin 0.3 - 1.2 mg/dL 0.7 - -  Alkaline Phos 38 - 126 U/L 40 - -  AST 15 - 41 U/L 16 - -  ALT 0 - 44 U/L 17 - -    DIAGNOSTIC IMAGING:  I have independently reviewed the scans and discussed with the patient. No results found.    ASSESSMENT:  1. 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 -She was being monitored with intermittent scans. -CT chest with contrast on 03/10/2020 showed interval progression of left lower lobe lung nodules measuring up to 7 mm. -CT scan on 06/22/2020 shows stable left lower lobe pulmonary nodules.  Tree-in-bud opacities in the right upper lobe along the minor fissure may represent infectious/inflammatory process.  Severe pulmonary emphysema. -PD-L1 testing was 1%.  2.  Brain metastasis: -Presentation with expressive aphasia to ER on 03/03/2019 along with right-sided weakness.  Found to have multiple brain metastasis and underwent SRS on 03/17/2019. 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 -Underwent SRS on 01/01/2020 for 2 subcentimeter brain lesions. -Brain MRI on 04/30/2020 showed 2 new subcentimeter meta stasis in the right cerebellum and right frontal lobe. -Stereotactic radiation therapy was completed on 05/20/2020.   PLAN:  1. Stage IV (PT1BPN0M1)  adenocarcinoma of the lung: -She had some fatigue from recent radiation.  She is recovering slowly. -Reviewed images of the CT of the chest from today which showed stable findings. -Recommend follow-up in 4 months with repeat CT of the chest.  2. Iron deficiency state: -Hemoglobin today is 11.6 with MCV of 91.8.  Last Shirlean Kelly was in February 2019.  3.  Osteoporosis: -Continue Prolia injections with Dr. Dennard Schaumann.   Orders placed this encounter:  No orders of the defined types were placed in this encounter.    Derek Jack, MD [facility] [phone]   I, Milinda Antis, am acting as a scribe for Dr. Sanda Linger.  I, Derek Jack MD, have reviewed the above documentation for accuracy and  completeness, and I agree with the above.

## 2020-06-23 ENCOUNTER — Inpatient Hospital Stay: Payer: PPO | Attending: Internal Medicine | Admitting: Hematology and Oncology

## 2020-06-23 ENCOUNTER — Ambulatory Visit
Admission: RE | Admit: 2020-06-23 | Discharge: 2020-06-23 | Disposition: A | Discharge status: Home / Self Care | Payer: PPO | Source: Ambulatory Visit | Attending: Urology | Admitting: Urology

## 2020-06-23 ENCOUNTER — Inpatient Hospital Stay: Payer: PPO

## 2020-06-23 ENCOUNTER — Encounter: Payer: Self-pay | Admitting: Hematology and Oncology

## 2020-06-23 ENCOUNTER — Ambulatory Visit (INDEPENDENT_AMBULATORY_CARE_PROVIDER_SITE_OTHER): Payer: PPO | Admitting: *Deleted

## 2020-06-23 ENCOUNTER — Other Ambulatory Visit: Payer: Self-pay

## 2020-06-23 VITALS — BP 100/71 | HR 79 | Temp 97.1°F | Resp 18 | Ht 60.0 in | Wt 108.3 lb

## 2020-06-23 DIAGNOSIS — J449 Chronic obstructive pulmonary disease, unspecified: Secondary | ICD-10-CM | POA: Insufficient documentation

## 2020-06-23 DIAGNOSIS — Z87442 Personal history of urinary calculi: Secondary | ICD-10-CM | POA: Insufficient documentation

## 2020-06-23 DIAGNOSIS — K589 Irritable bowel syndrome without diarrhea: Secondary | ICD-10-CM | POA: Diagnosis not present

## 2020-06-23 DIAGNOSIS — C7931 Secondary malignant neoplasm of brain: Secondary | ICD-10-CM

## 2020-06-23 DIAGNOSIS — Z8249 Family history of ischemic heart disease and other diseases of the circulatory system: Secondary | ICD-10-CM | POA: Insufficient documentation

## 2020-06-23 DIAGNOSIS — F329 Major depressive disorder, single episode, unspecified: Secondary | ICD-10-CM | POA: Diagnosis not present

## 2020-06-23 DIAGNOSIS — E222 Syndrome of inappropriate secretion of antidiuretic hormone: Secondary | ICD-10-CM | POA: Insufficient documentation

## 2020-06-23 DIAGNOSIS — Z791 Long term (current) use of non-steroidal anti-inflammatories (NSAID): Secondary | ICD-10-CM | POA: Insufficient documentation

## 2020-06-23 DIAGNOSIS — Z803 Family history of malignant neoplasm of breast: Secondary | ICD-10-CM | POA: Insufficient documentation

## 2020-06-23 DIAGNOSIS — Z87891 Personal history of nicotine dependence: Secondary | ICD-10-CM | POA: Diagnosis not present

## 2020-06-23 DIAGNOSIS — Z79899 Other long term (current) drug therapy: Secondary | ICD-10-CM | POA: Insufficient documentation

## 2020-06-23 DIAGNOSIS — E039 Hypothyroidism, unspecified: Secondary | ICD-10-CM | POA: Diagnosis not present

## 2020-06-23 DIAGNOSIS — M810 Age-related osteoporosis without current pathological fracture: Secondary | ICD-10-CM

## 2020-06-23 DIAGNOSIS — C3432 Malignant neoplasm of lower lobe, left bronchus or lung: Secondary | ICD-10-CM | POA: Diagnosis not present

## 2020-06-23 DIAGNOSIS — C3492 Malignant neoplasm of unspecified part of left bronchus or lung: Secondary | ICD-10-CM | POA: Diagnosis not present

## 2020-06-23 DIAGNOSIS — F419 Anxiety disorder, unspecified: Secondary | ICD-10-CM | POA: Diagnosis not present

## 2020-06-23 DIAGNOSIS — Z7952 Long term (current) use of systemic steroids: Secondary | ICD-10-CM | POA: Diagnosis not present

## 2020-06-23 MED ORDER — DENOSUMAB 60 MG/ML ~~LOC~~ SOSY
60.0000 mg | PREFILLED_SYRINGE | Freq: Once | SUBCUTANEOUS | Status: AC
  Administered 2020-06-23: 60 mg via SUBCUTANEOUS

## 2020-06-23 NOTE — Progress Notes (Signed)
Radiation Oncology         ([phone]) [phone] ________________________________  Name: [name] MRN: [id number]  Date: 06/23/2020  DOB: May 22, 1941  Post Treatment Note: Conducted via telephone due to current COVID-19 concerns for limiting patient exposure  CC: Ruth Frizzle, MD  Ruth Frizzle, MD   Diagnosis:   79 y.o. 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  Interval Since Last Radiation: 1 month 05/20/20//SRS:  The two targets were treated to a prescription dose of 20 Gy in a single fraction: PTV8 Rt Cerebellum 68m  PTV9 Rt Frontal 578m 01/01/20//SRS:  PTV6 (3 mm inferior right internal capsule lesion) and PTV7 (5 mm left frontal lesion along the falx) were treated to a prescription dose of 20 Gy in a single fraction   03/17/19:  Pre-Op SRS brain in a single fraction to the following targets: PTV1 Post Lt Parietal 405m8Gy PTV2 Lt Cerebellar 5mm55m0Gy PTV3 Rt Temp 2mm 21my PTV4 Post Lt Insular 5mm  23mGy PTV5 Lt Frontal Gyrus 6mm  210m  Followed by stereotactic left parietal craniotomy for resection of tumor with Dr. NundkumKathyrn Sheriff0/20.  Narrative:  I spoke with the patient to conduct her routine scheduled 1 month follow up visit via telephone to spare the patient unnecessary potential exposure in the healthcare setting during the current COVID-19 pandemic.  The patient was notified in advance and gave permission to proceed with this visit format.  She tolerated her recent SRS treatment to the lesions in the medial right cerebellum and high posterior right frontal regions without any ill effects.  In summary, she initially presented [facility] ED [facility] with acute onset aphasia and right-sided weakness. 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  This was treated with surgical resection of the left lower lobe on 12/21/2010, 14  lymph nodes negative, 2.2 cm tumor size, margins negative.  She had been followed by Dr. KatragaDelton Coombeservation since that time with serial chest CT scans without evidence of disease recurrence or progression.  On admission, the initial head CT (without contrast due to contrast allergy) showed left parietal and occipital parenchymal hematoma measuring up to 3.7 cm, nonspecific, but felt most likely related to hypertension or underlying vasculitis in a patient of this age. Surrounding edema and mass effect mostly local with minimal midline shift of 3-4 mm; no other focal lesions or hemorrhage. A repeat head CT the next day showed the hematoma as stable. She met with Dr. NundkumKathyrn Sheriff7/2020. Following the consultation, she underwent brain MRI (with gadavist contrast) which revealed 5 intraparenchymal metastatic lesions, the largest of which measured 3.7 cm in the posterior left parietal lobe where the associated hemorrhage made accurate measurement difficult. The recommendation was to proceed with pre-op SRS followed by planned resection of the largest lesion in the left parietal lobe under the care and direction of Dr. NundkumKathyrn Sheriffwas in agreement with the plan and completed preop SRS in a single fraction to the 5 brain mets on 03/17/2019 followed by surgical resention of the left parietal lesion performed on 03/18/2019.   Follow up MRI brain performed on 06/26/19 showed overall significant improvement with the previously treated lesions in the left cerebellum, inferior right temporal lobe, deep insular region on the left, and left central sulcus region no longer visible. There was only marginal enhancement along the edges of the post resection  cavity in the left posterior parietal lobe which appeared fairly thin rather than nodular enhancement and therefore was inconclusive.  These images were reviewed at the multidisciplinary brain tumor boards on 06/29/19 and consensus recommendation was to just monitor  closely on follow up imaging.  She also had repeat systemic imaging with CT C/A/P and a follow up with Dr. Delton Coombes to review those results on 06/09/19. Fortunately, her systemic imaging continued to show disease stability without evidence of any recurrent, progressive or metastatic disease in the C/A/P. She continued on observation only under the care and direction of Dr. Delton Coombes. A PET scan on 09/14/2019 did not show any evidence of recurrence or metastatic disease and follow up brain MRI on 09/24/19 showed a decreased size of the resection cavity and was without evidence of recurrent tumor, new mass or abnormal enhancement.  Unfortunately, the follow up MRI brain performed on 12/24/19 showed a 3 mm enhancing lesion along the inferior right internal capsule that was punctate on the prior study as well as a 5 mm enhancing left frontal lesion along the falx that was also previously punctate dating back to 06/26/2019.  She also had a CT A/P on 12/07/19 for further assessment of abdominal pain and this did not show any pathologically enlarged lymph nodes or other findings to suggest metastatic disease in the abdomen or pelvis. The abdominal pain was felt most likely secondary to constipation considering the volume of stool in the colon.  SRS treatment to the lesions in the inferior right internal capsule and left frontal region was completed on 01/01/20 and tolerated very well.  Repeat MRI brain on 04/30/20 showed improvement in the recently treated lesions and evolving post-treatment changes at the left parieto-occipital resection cavity but also showed 2 new lesions, with a 16m enhancing lesion in the medial right cerebellum and a 2. 5 mm enhancing lesion in the high posterior right frontal lobe. These lesions were treated with a single fraction of SRS on 05/20/20 and tolerated very well as usual.  INTERVAL HISTORY:  She appears to have recovered well from the effects of her recent radiation aside from some  persistent right sided visual field impairment, present since the time of her surgery in 03/18/19 and not progressive. Otherwise, she is without complaints.  She did meet with Dr. VMickeal Skinnerrecently on 05/05/2020 for further evaluation and management of the persistent right-sided visual field deficits and plans to follow up with him following her upcoming post-treatment brain MRI. She saw Dr. KDelton Coombeson 06/22/20 with a restaging CT Chest same day that showed stable disease and therefore, the current plan is to continue in observation with repeat imaging and follow up in 4 months. She is planning to [facility] for consideration of transitioning all of her care to occur locally at one single facility.                     On review of systems, she states that she has continued getting along fairly well with improved balance and mobility since completing treatment. She specifically denies headaches, N/V, dizziness, recent changes in auditory or visual acuity, tremors, difficulty with speech or seizure activity. She previously completed formal PT in her home but continues with a home exercise routine that they provided and is able to continue living at home independently which she is quite pleased with. She has had some decreased appetite recently as well as mild fatigue. She  also has a chronic productive cough, recently with some yellow sputum but no hemoptysis and denies increased shortness of breath, fever or chills.  She also reports that the constipation that she was struggling with previously has been manageable with use of Miralax prn.  ALLERGIES:  is allergic to ciprofloxacin hcl, sulfonamide derivatives, iohexol, iodinated diagnostic agents, and prozac [fluoxetine hcl].  Meds: Current Outpatient Medications  Medication Sig Dispense Refill  . albuterol (VENTOLIN HFA) 108 (90 Base) MCG/ACT inhaler Inhale 2 puffs into the lungs every 6 (six)  hours as needed for wheezing or shortness of breath. 18 g 2  . ALPRAZolam (XANAX) 0.5 MG tablet TAKE (1) TABLET BY MOUTH TWICE A DAY AS NEEDED. 60 tablet 3  . cephALEXin (KEFLEX) 500 MG capsule Take 1 capsule (500 mg total) by mouth 3 (three) times daily. 21 capsule 0  . citalopram (CELEXA) 40 MG tablet TAKE (1/2) TABLET BY MOUTH AT BEDTIME. 45 tablet 5  . denosumab (PROLIA) 60 MG/ML SOSY injection Inject 60 mg into the skin every 6 (six) months. 1 mL 0  . diphenhydrAMINE (BENADRYL) 50 MG tablet Take 1 tablet (50 mg total) by mouth once for 1 dose. Take one hour prior to CT scan 1 tablet 0  . ferrous sulfate 325 (65 FE) MG tablet Take 325 mg by mouth daily with breakfast.    . fluticasone (FLONASE) 50 MCG/ACT nasal spray INSTILL 2 SPRAYS INTO BOTH NOSTRILS DAILY. 16 g 0  . levocetirizine (XYZAL) 5 MG tablet TAKE ONE TABLET BY MOUTH ONCE DAILY. 30 tablet 0  . levothyroxine (SYNTHROID) 50 MCG tablet TAKE 1 TABLET BEFORE BREAKFAST. 90 tablet 0  . meloxicam (MOBIC) 7.5 MG tablet Take 1 tablet (7.5 mg total) by mouth daily. 30 tablet 0  . Multiple Vitamin (MULTIVITAMIN WITH MINERALS) TABS tablet Take 1 tablet by mouth daily.    . ondansetron (ZOFRAN) 4 MG tablet TAKE 1 TABLET BY MOUTH 3 TIMES DAILY AS NEEDED FOR NAUSEA AND VOMITING. 90 tablet 0  . OVER THE COUNTER MEDICATION Bone Support 2-4 tabs daily    . OXcarbazepine (TRILEPTAL) 150 MG tablet TAKE (1) TABLET BY MOUTH TWICE DAILY. 60 tablet 3  . pantoprazole (PROTONIX) 40 MG tablet TAKE (1) TABLET BY MOUTH ONCE DAILY BEFORE BREAKFAST. 90 tablet 3  . predniSONE (DELTASONE) 10 MG tablet TAKE AS DIRECTED PERHPACKAGE INSTRUCTIONS.    Marland Kitchen predniSONE (DELTASONE) 50 MG tablet Please take one tablet at 13 hours, 7 hours and one hour prior to CT scan 3 tablet 0  . Probiotic Product (ALIGN PO) Take by mouth daily.    . TRELEGY ELLIPTA 100-62.5-25 MCG/INH AEPB INHALE 1 PUFF INTO LUNGS DAILY. 60 each 0   No current facility-administered medications for this  encounter.    Physical Findings:  vitals were not taken for this visit.   /Unable to assess due to telephone visit format.   Lab Findings: Lab Results  Component Value Date   WBC 6.6 06/20/2020   HGB 11.6 (L) 06/20/2020   HCT 35.6 (L) 06/20/2020   MCV 91.8 06/20/2020   PLT 267 06/20/2020     Radiographic Findings: CT Chest W Contrast  Result Date: 06/22/2020 CLINICAL DATA:  History of pulmonary neoplasm, adenocarcinoma diagnosed in 2012 with metastatic disease to the brain. Follow-up examination recent CT displayed signs of worsening of lower lobe nodules. EXAM: CT CHEST WITH CONTRAST TECHNIQUE: Multidetector CT imaging of the chest was performed during intravenous contrast administration. CONTRAST:  5m OMNIPAQUE IOHEXOL 300 MG/ML  SOLN COMPARISON:  03/10/2020 FINDINGS: Cardiovascular: Calcified and noncalcified plaque of the thoracic aorta without significant dilation. Normal heart size. Tortuosity of the descending thoracic aorta with similar appearance. No pericardial effusion. Mediastinum/Nodes: Thoracic inlet structures are normal. No axillary lymphadenopathy. No mediastinal lymphadenopathy. Esophagus mildly patulous similar to the previous study. Lungs/Pleura: Severe pulmonary emphysema. LEFT lower lobe pulmonary nodule below the hilum (image 85, series 4) 7 x 6 mm unchanged since recent imaging. No consolidation. No pleural effusion. Scarring in the LEFT lower lobe with signs of previous wedge resection along the fissure in the LEFT chest. 3 mm nodule in the medial LEFT lower lobe shows no change on image 90 of series 4. Bandlike scarring in the RIGHT upper lobe. Tree-in-bud opacities in the RIGHT upper lobe along the minor fissure. No sign of consolidation or evidence of pleural effusion. Upper Abdomen: Incidental imaging of upper abdominal contents without acute process. The adrenal glands are normal. Dilation of the infrarenal abdominal aorta is only partially visualized dilated to  perhaps as much as 3.2 cm Musculoskeletal: Multilevel thoracic spinal compression deformities and signs of prior spinal fusion show no change. IMPRESSION: 1. Stable LEFT lower lobe pulmonary nodules. 2. Tree-in-bud opacities in the RIGHT upper lobe along the minor fissure, may represent an infectious or inflammatory process. Attention on follow-up studies, correlate with any respiratory symptoms 3. Severe pulmonary emphysema. 4. Dilation of the infrarenal abdominal aorta is only partially visualized to perhaps as much as 3.2 cm. See previous CT from March of 2021. 5. Emphysema and aortic atherosclerosis. Aortic Atherosclerosis (ICD10-I70.0) and Emphysema (ICD10-J43.9). Electronically Signed   By: Zetta Bills M.D.   On: 06/22/2020 12:32    Impression/Plan:  This visit was conducted via telephone to spare the patient unnecessary potential exposure in the healthcare setting during the current COVID-19 pandemic.  30. 79 y.o. 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 She continues to do well clinically and has recovered well from the effects of her recent [facility] and is currently without complaints. We discussed the plan to obtain a follow up brain MRI to assess treatment response in approximately 2 months and pending this scan is stable, we will plan continue with serial MRI brain scans every 3 months for the first 2 years, to closely monitor for any disease recurrence or progression. After 2 years, we will move to 6 month brain MRI scans pending her disease remains stable. She prefers to continue her follow up, in the office or by telephone, with Dr. Mickeal Skinner following each MRI brain scan to review results and recommendations from our multidisciplinary brain conference, seeing [place] prn only if further treatment is indicated in the future. She is also considering transitioning her medical oncology care from Dr. Delton Coombes to Dr. [name] here at the [facility] lung cancer center for continued  observation and management of her systemic disease. She is currently on observation only, with a planned follow up in 10/2020 with repeat systemic imaging prior to that visit. She knows to call with any questions or concerns in the interim.  Given current concerns for patient exposure during the COVID-19 pandemic, this encounter was conducted via telephone. The patient was notified in advance and was offered a WebEX or [place] video enabled meeting to allow for face to face communication but unfortunately reported that she did not have the appropriate resources/technology to support such a visit and instead preferred to proceed with a telephone follow up visit. The patient has given verbal consent for this type of encounter. The time spent  during this encounter was 35 minutes. The attendants for this meeting include Keanon Bevins PA-C, and patient, Sidney Silberman. During the encounter, Adell Koval PA-C, was located at [facility] Radiation Oncology Department.  Patient, Nadira Single was located at home.    Nicholos Johns, PA-C

## 2020-06-23 NOTE — Progress Notes (Signed)
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 0/14 lymph nodes.  No perineural invasion or LVI.  negative margins 2) 03/03/2019: presented to ED with expressive aphasia, found to have multiple brain metastasis and underwent SRS on 03/17/2019. 3) 03/18/2019: Stereotactic left parietal craniotomy for resection of tumor 4) 04/30/2020: Brain MRI on showed 2 new subcentimeter meta stasis in the right cerebellum and right frontal lobe. 5)  05/20/2020: Stereotactic radiation therapy was completed 6) 06/23/2020: establish care with Dr. [name]   Interval History:  [name] 79 y.o. female with medical history significant for metastatic adenocarcinoma of the lung with brain metastasis who presents for a follow up visit. The patient's last visit was on 06/23/2020 with Dr. Lamonte Richer. In the interim since the last visit the patient had a CT scan of the chest performed.   On exam today [name] is accompanied by her former husband.  He notes that she has a preference to be seen at the [facility] cancer center at [facility] long because her neuro oncologist and radiation oncologist are also here.  We are happy to assume her care so that she can have all of her physicians in one location.  Additionally we can continue to order her scans locally in [place] so that she does not have to commute to [place] in order to get the scans performed.   On further discussion [name] notes that she has had some issues with cough productive for a yellowish-green sputum over the last several months.  She notes that she takes Mucinex which help her cough up a good amount of this.  She also notes that in June she received prednisone azithromycin which significantly improved her symptoms.  She notes that she is not currently having any infectious symptoms at this time.  She reports that her appetite is good and that she is at her baseline level of function.  She notes that she use a walker to walk around the house, but does not typically leave the house or walk outside of the home.  She notes that she is not having any issues with fevers, chills, sweats, nausea, vomiting or diarrhea.  She notes that she is fairly fatigued from radiation therapy but otherwise well.  A full 10 point ROS is listed below.  MEDICAL HISTORY:  Past Medical History:  Diagnosis Date  . Adenocarcinoma of lung ([place]) 12/2010   left lung/surg only  . Allergic rhinitis   . 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[place])    corrected by surgery 08/21/13  . Anxiety disorder   . Aortic aneurysm ([place])   . Brain metastasis ([place])    lung cancer s/p resection 7/20  . Cancer ([place])    Phreesia 05/26/2020  . Complication of anesthesia 2011   bloodpressure dropped during colonoscopy, not problems since  . Compression fracture   . COPD (chronic obstructive pulmonary disease) ([place])   . Depression   . Diastolic dysfunction   . Diverticulosis   . Dysphagia   . Emphysema lung ([place]) 11/08/2014  . Headache   .  Hiatal hernia   . History of kidney stones   . Hypothyroidism   . IBS (irritable bowel syndrome)   . Iron deficiency anemia due to chronic blood loss 12/05/2016  . On home O2 12/15/2013   chronic hypoxia  . Pneumonia   . PUD (peptic ulcer disease)    87yr  . Shortness of breath    with exertion  . SIADH (syndrome of inappropriate ADH production) ([place]   . Trigeminal neuralgia      SURGICAL HISTORY: Past Surgical History:  Procedure Laterality Date  . ABDOMINAL HYSTERECTOMY    . APPLICATION OF CRANIAL NAVIGATION N/A 03/18/2019   Procedure: APPLICATION OF CRANIAL NAVIGATION;  Surgeon: NConsuella Lose MD;  Location: [facility]  Service: Neurosurgery;  Laterality: N/A;  . BACK SURGERY  January 13, 2014  . BRAIN SURGERY N/A    Phreesia 03/28/2020  . CATARACT EXTRACTION    . CATARACT EXTRACTION W/PHACO  09/02/2012   Procedure: CATARACT EXTRACTION PHACO AND INTRAOCULAR LENS PLACEMENT (IOC);  Surgeon: MElta GuadeloupeT. SGershon Crane MD;  Location: [facility];  Service: Ophthalmology;  Laterality: Left;  CDE:10.35  . CHOLECYSTECTOMY    . 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 COLONOSCOPY N/A 11/23/2014   Dr. FOneida Alar moderate diverticulosis, hemorrhoids, redundant colon. next TCS in 10-15 years.   . CRANIOTOMY Left 03/18/2019   Procedure: Left stereotactic craniotomy for tumor resection;  Surgeon: NConsuella Lose MD;  [facility]  Service: Neurosurgery;  Laterality: Left;  Left stereotactic craniotomy for tumor resection  . ENDARTERECTOMY Right 08/21/2013   Procedure: RIGHT CAROTID ANEURYSM RESECTION;  Surgeon: TRosetta Posner MD;  Location: [facility]  Service: Vascular;  Laterality: Right;  . ESOPHAGEAL DILATION  02/24/2018   Procedure: ESOPHAGEAL DILATION;  Surgeon: FDanie Binder MD;  Location[facility];  Service: Endoscopy;;  . ESOPHAGOGASTRODUODENOSCOPY  11/13/2009   w/dilation to 17m gastric ulceration (H.Pylori) s/p treatment  . ESOPHAGOGASTRODUODENOSCOPY  11/21/2009   distal esophageal web, gastritis  . ESOPHAGOGASTRODUODENOSCOPY  03/14/12   SLYNW:GNFAOZHYQn the distal esophagus/Mild gastritis/small HH. + H.pylori, prescribed Pylera. Finished treatment.   . ESOPHAGOGASTRODUODENOSCOPY N/A 12/13/2017   Dr. FiOneida Alaresophageal web s/p dilation, gastritis, no h.pylori  . ESOPHAGOGASTRODUODENOSCOPY N/A 02/24/2018   Dr. FiOneida Alarweb in prox esophagus  and in distal esophagus s/p dilation, mild gastritis, mild pyloric stenosis, mild post ulcer duodenal deformity at D1/D2  . fatty tumor removal from lt groin    . FRACTURE SURGERY Left    hand and left ring finger  . GIVENS CAPSULE STUDY N/A 12/26/2017   incomplete  . GIVENS CAPSULE STUDY N/A 02/24/2018   normal small bowel  . KNEE SURGERY     patella tendon repair june 2019 [place]  . LUNG CANCER SURGERY  12/2010   Left VATS, minithoracotomy, LLL superior segmentectomy  . ORIF PATELLA Right 03/18/2018   Procedure: OPEN REDUCTION INTERNAL (ORIF) FIXATION RIGHT PATELLA;  Surgeon: HaCarole CivilMD;  Location: [facility];  Service: Orthopedics;  Laterality: Right;  . ORIF WRIST FRACTURE Left 04/09/2014   Procedure: OPEN REDUCTION INTERNAL FIXATION (ORIF) WRIST FRACTURE;  Surgeon: TiRenette ButtersMD;  [facility] Service: Orthopedics;  Laterality: Left;  . PERCUTANEOUS PINNING Left 04/09/2014   Procedure: PERCUTANEOUS PINNING EXTREMITY;  Surgeon: TiRenette ButtersMD;  [facility]: [facility] Service: Orthopedics;  Laterality: Left;  . SAVORY DILATION N/A 12/13/2017   Procedure: SAVORY DILATION;  Surgeon: FiDanie BinderMD;  Location[facility];  Service: Endoscopy;  Laterality: N/A;  . TUBAL LIGATION    . vocal cord surgery  02/06/2011   laryngoscopy with bilateral vocal cord Radiesse injection for vocal cord paralysis  . YAG LASER APPLICATION Left 83/15/1761   Procedure: YAG LASER APPLICATION;  Surgeon: Rutherford Guys, MD;  Location: [facility];  Service: Ophthalmology;  Laterality: Left;    SOCIAL HISTORY: Social History   Socioeconomic History  . Marital status: Divorced    Spouse name: Not on file  . Number of children: Not on file  . Years of education: Not on file  . Highest education level: Not on file  Occupational History  . Not on file  Tobacco Use  . Smoking status: Former Smoker    Packs/day: 0.50    Years: 20.00    Pack years: 10.00    Types: Cigarettes    Quit date:  12/21/2010    Years since quitting: 9.5  . Smokeless tobacco: Never Used  . Tobacco comment: smoking cessation info given and reviewed   Vaping Use  . Vaping Use: Never used  Substance and Sexual Activity  . Alcohol use: Not Currently    Alcohol/week: 0.0 standard drinks  . Drug use: No  . Sexual activity: Yes    Birth control/protection: Surgical  Other Topics Concern  . Not on file  Social History Narrative   Divorced since 70.Lives alone with a dog.Marland KitchenRetired.  She receives Meals on Wheels.   Social Determinants of Health   Financial Resource Strain: Low Risk   . Difficulty of Paying Living Expenses: Not hard at all  Food Insecurity:   . Worried About Charity fundraiser in the Last Year: Not on file  . Ran Out of Food in the Last Year: Not on file  Transportation Needs:   . Lack of Transportation (Medical): Not on file  . Lack of Transportation (Non-Medical): Not on file  Physical Activity:   . Days of Exercise per Week: Not on file  . Minutes of Exercise per Session: Not on file  Stress:   . Feeling of Stress : Not on file  Social Connections:   . Frequency of Communication with Friends and Family: Not on file  . Frequency of Social Gatherings with Friends and Family: Not on file  . Attends Religious Services: Not on file  . Active Member of Clubs or Organizations: Not on file  . Attends Archivist Meetings: Not on file  . Marital Status: Not on file  Intimate Partner Violence:   . Fear of Current or Ex-Partner: Not on file  . Emotionally Abused: Not on file  . Physically Abused: Not on file  . Sexually Abused: Not on file    FAMILY HISTORY: Family History  Problem Relation Age of Onset  . Pulmonary embolism Mother   . Heart attack Father   . Hypertension Father   . Liver cancer Sister   . Cancer Sister   . Cancer Brother   . Cancer Daughter   . Breast cancer Daughter   . Colon cancer Neg Hx     ALLERGIES:  is allergic to ciprofloxacin hcl,  sulfonamide derivatives, iohexol, iodinated diagnostic agents, and prozac [fluoxetine hcl].  MEDICATIONS:  Current Outpatient Medications  Medication Sig Dispense Refill  . albuterol (VENTOLIN HFA) 108 (90 Base) MCG/ACT inhaler Inhale 2 puffs into the lungs every 6 (six) hours as needed for wheezing or shortness of breath. 18 g 2  . ALPRAZolam (XANAX) 0.5 MG tablet  TAKE (1) TABLET BY MOUTH TWICE A DAY AS NEEDED. 60 tablet 3  . cephALEXin (KEFLEX) 500 MG capsule Take 1 capsule (500 mg total) by mouth 3 (three) times daily. 21 capsule 0  . citalopram (CELEXA) 40 MG tablet TAKE (1/2) TABLET BY MOUTH AT BEDTIME. 45 tablet 5  . denosumab (PROLIA) 60 MG/ML SOSY injection Inject 60 mg into the skin every 6 (six) months. 1 mL 0  . diphenhydrAMINE (BENADRYL) 50 MG tablet Take 1 tablet (50 mg total) by mouth once for 1 dose. Take one hour prior to CT scan 1 tablet 0  . ferrous sulfate 325 (65 FE) MG tablet Take 325 mg by mouth daily with breakfast.    . fluticasone (FLONASE) 50 MCG/ACT nasal spray INSTILL 2 SPRAYS INTO BOTH NOSTRILS DAILY. 16 g 0  . levocetirizine (XYZAL) 5 MG tablet TAKE ONE TABLET BY MOUTH ONCE DAILY. 30 tablet 0  . levothyroxine (SYNTHROID) 50 MCG tablet TAKE 1 TABLET BEFORE BREAKFAST. 90 tablet 0  . meloxicam (MOBIC) 7.5 MG tablet Take 1 tablet (7.5 mg total) by mouth daily. 30 tablet 0  . Multiple Vitamin (MULTIVITAMIN WITH MINERALS) TABS tablet Take 1 tablet by mouth daily.    . ondansetron (ZOFRAN) 4 MG tablet TAKE 1 TABLET BY MOUTH 3 TIMES DAILY AS NEEDED FOR NAUSEA AND VOMITING. 90 tablet 0  . OVER THE COUNTER MEDICATION Bone Support 2-4 tabs daily    . OXcarbazepine (TRILEPTAL) 150 MG tablet TAKE (1) TABLET BY MOUTH TWICE DAILY. 60 tablet 3  . pantoprazole (PROTONIX) 40 MG tablet TAKE (1) TABLET BY MOUTH ONCE DAILY BEFORE BREAKFAST. 90 tablet 3  . predniSONE (DELTASONE) 10 MG tablet TAKE AS DIRECTED PERHPACKAGE INSTRUCTIONS.    Marland Kitchen predniSONE (DELTASONE) 50 MG tablet Please take  one tablet at 13 hours, 7 hours and one hour prior to CT scan 3 tablet 0  . Probiotic Product (ALIGN PO) Take by mouth daily.    . TRELEGY ELLIPTA 100-62.5-25 MCG/INH AEPB INHALE 1 PUFF INTO LUNGS DAILY. 60 each 0   No current facility-administered medications for this visit.    REVIEW OF SYSTEMS:   Constitutional: ( - ) fevers, ( - )  chills , ( - ) night sweats Eyes: ( - ) blurriness of vision, ( - ) double vision, ( - ) watery eyes Ears, nose, mouth, throat, and face: ( - ) mucositis, ( - ) sore throat Respiratory: ( - ) cough, ( - ) dyspnea, ( - ) wheezes Cardiovascular: ( - ) palpitation, ( - ) chest discomfort, ( - ) lower extremity swelling Gastrointestinal:  ( - ) nausea, ( - ) heartburn, ( - ) change in bowel habits Skin: ( - ) abnormal skin rashes Lymphatics: ( - ) new lymphadenopathy, ( - ) easy bruising Neurological: ( - ) numbness, ( - ) tingling, ( - ) new weaknesses Behavioral/Psych: ( - ) mood change, ( - ) new changes  All other systems were reviewed with the patient and are negative.  PHYSICAL EXAMINATION: ECOG PERFORMANCE STATUS: 3 - Symptomatic, >50% confined to bed  There were no vitals filed for this visit. There were no vitals filed for this visit.  GENERAL: chronically ill appearing elderly Caucasian female. Alert, no distress and comfortable SKIN: skin color, texture, turgor are normal, no rashes or significant lesions EYES: conjunctiva are pink and non-injected, sclera clear LUNGS: clear to auscultation and percussion with normal breathing effort HEART: regular rate & rhythm and no murmurs and no lower extremity  edema ABDOMEN: soft, non-tender, non-distended, normal bowel sounds Musculoskeletal: no cyanosis of digits and no clubbing  PSYCH: alert & oriented x 3, fluent speech NEURO: no focal motor/sensory deficits  LABORATORY DATA:  I have reviewed the data as listed CBC Latest Ref Rng & Units 06/20/2020 03/30/2020 03/15/2020  WBC 4.0 - 10.5 K/uL 6.6 9.5  7.2  Hemoglobin 12.0 - 15.0 g/dL 11.6(L) 12.8 11.7(L)  Hematocrit 36 - 46 % 35.6(L) 39.2 36.3  Platelets 150 - 400 K/uL 267 370 322    CMP Latest Ref Rng & Units 06/20/2020 05/11/2020 03/30/2020  Glucose 70 - 99 mg/dL 95 - 112(H)  BUN 8 - 23 mg/dL 25(H) 19 16  Creatinine 0.44 - 1.00 mg/dL 0.45 0.46 0.45  Sodium 135 - 145 mmol/L 132(L) - 134(L)  Potassium 3.5 - 5.1 mmol/L 4.6 - 4.2  Chloride 98 - 111 mmol/L 95(L) - 92(L)  CO2 22 - 32 mmol/L 30 - 31  Calcium 8.9 - 10.3 mg/dL 10.2 - 9.6  Total Protein 6.5 - 8.1 g/dL 6.3(L) - -  Total Bilirubin 0.3 - 1.2 mg/dL 0.7 - -  Alkaline Phos 38 - 126 U/L 40 - -  AST 15 - 41 U/L 16 - -  ALT 0 - 44 U/L 17 - -    RADIOGRAPHIC STUDIES: I have personally reviewed the radiological images as listed and agreed with the findings in the report: small pulmonary nodules, no over signs of recurrence.   CT Chest W Contrast  Result Date: 06/22/2020 CLINICAL DATA:  History of pulmonary neoplasm, adenocarcinoma diagnosed in 2012 with metastatic disease to the brain. Follow-up examination recent CT displayed signs of worsening of lower lobe nodules. EXAM: CT CHEST WITH CONTRAST TECHNIQUE: Multidetector CT imaging of the chest was performed during intravenous contrast administration. CONTRAST:  57m OMNIPAQUE IOHEXOL 300 MG/ML  SOLN COMPARISON:  03/10/2020 FINDINGS: Cardiovascular: Calcified and noncalcified plaque of the thoracic aorta without significant dilation. Normal heart size. Tortuosity of the descending thoracic aorta with similar appearance. No pericardial effusion. Mediastinum/Nodes: Thoracic inlet structures are normal. No axillary lymphadenopathy. No mediastinal lymphadenopathy. Esophagus mildly patulous similar to the previous study. Lungs/Pleura: Severe pulmonary emphysema. LEFT lower lobe pulmonary nodule below the hilum (image 85, series 4) 7 x 6 mm unchanged since recent imaging. No consolidation. No pleural effusion. Scarring in the LEFT lower lobe with  signs of previous wedge resection along the fissure in the LEFT chest. 3 mm nodule in the medial LEFT lower lobe shows no change on image 90 of series 4. Bandlike scarring in the RIGHT upper lobe. Tree-in-bud opacities in the RIGHT upper lobe along the minor fissure. No sign of consolidation or evidence of pleural effusion. Upper Abdomen: Incidental imaging of upper abdominal contents without acute process. The adrenal glands are normal. Dilation of the infrarenal abdominal aorta is only partially visualized dilated to perhaps as much as 3.2 cm Musculoskeletal: Multilevel thoracic spinal compression deformities and signs of prior spinal fusion show no change. IMPRESSION: 1. Stable LEFT lower lobe pulmonary nodules. 2. Tree-in-bud opacities in the RIGHT upper lobe along the minor fissure, may represent an infectious or inflammatory process. Attention on follow-up studies, correlate with any respiratory symptoms 3. Severe pulmonary emphysema. 4. Dilation of the infrarenal abdominal aorta is only partially visualized to perhaps as much as 3.2 cm. See previous CT from March of 2021. 5. Emphysema and aortic atherosclerosis. Aortic Atherosclerosis (ICD10-I70.0) and Emphysema (ICD10-J43.9). Electronically Signed   By: GZetta BillsM.D.   On: 06/22/2020 12:32  ASSESSMENT & PLAN [name] 79 y.o. female with medical history significant for metastatic adenocarcinoma of the lung with brain metastasis who presents for a follow up visit.  After review the records, review of the labs, review the imaging, discussion with the patient the findings are most consistent with a stage IV adenocarcinoma of the lung with a small lung lesion and metastatic spread to the brain.  Her metastatic brain mets have been treated with resection of tumor on 03/18/2019 as well as SRS on 12/12/2019.  Most recently she was found to have 2 new subcentimeter lesions and underwent stereotactic radiation therapy which was completed on 05/20/2020.     Currently there is no visible disease other than some pulmonary nodules which are currently stable and under observation.  As such I do not see an indication to start therapy at this time.  Additionally I do believe that there are limitations in what therapies can be used for this patient given her poor functional status.  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  The use of this medication in her current setting is not clear given that there is no visible disease.  At this time would favor continued serial imaging with consideration of this therapy in the event progression were to be noted.  #Stage IV ([id number]) adenocarcinoma of the lung: -Left lower lobe resection on 12/21/2010, 14 lymph nodes negative, 2.2 cm tumor size, margins negative. -She is being monitored with intermittent scans. -CT chest with contrast on 03/10/2020 showed interval progression of left lower lobe lung nodules measuring up to 7 mm. Repeat CT scan on 06/22/2020 shows stable left lower lobe pulmonary nodules.  Tree-in-bud opacities in the right upper lobe along the minor fissure may represent infectious/inflammatory process.  Severe pulmonary emphysema. -PD-L1 testing was 1%. --at this time favor continued serial imaging and monitoring for progression. Patient is a poor candidate for systemic therapy, though she does have an EGFR Exon 19 mutation and could be considered for osimertinib therapy on progression. Without focal active disease I would recommend on holding therapy at this time.  --plan for return visit after repeat scan, currently scheduled for 10/19/2020.   #Brain metastasis: -Presentation with expressive aphasia to ER on 03/03/2019 along with right-sided weakness. Found to have multiple brain metastasis and underwent SRS on 03/17/2019. -Stereotactic left parietal craniotomy for resection of tumor on 03/18/2019, biopsy  consistent with adenocarcinoma of lung primary. -Underwent SRS on 01/01/2020 for 2 subcentimeter brain lesions. -Brain MRI on 04/30/2020 showed 2 new subcentimeter meta stasis in the right cerebellum and right frontal lobe. -Stereotactic radiation therapy was completed on 05/20/2020  No orders of the defined types were placed in this encounter.   All questions were answered. The patient knows to call the clinic with any problems, questions or concerns.  A total of more than 40 minutes were spent on this encounter and over half of that time was spent on counseling and coordination of care as outlined above.   Ledell Peoples, MD Department of Hematology/Oncology [place] at [facility] Phone: [phone] Pager: [phone] Email: [email or web address]  06/23/2020 11:24 AM

## 2020-06-24 ENCOUNTER — Telehealth: Payer: Self-pay | Admitting: Hematology and Oncology

## 2020-06-24 NOTE — Telephone Encounter (Signed)
Scheduled per los. Called and spoke with patient. Confirmed appt 

## 2020-06-27 ENCOUNTER — Telehealth: Payer: Self-pay | Admitting: Family Medicine

## 2020-06-27 NOTE — Telephone Encounter (Signed)
Patient would like you to look at the CT that was done last Thursday and advise if she needs medication.  CB# [phone]

## 2020-07-02 DIAGNOSIS — R0902 Hypoxemia: Secondary | ICD-10-CM | POA: Diagnosis not present

## 2020-07-02 DIAGNOSIS — J449 Chronic obstructive pulmonary disease, unspecified: Secondary | ICD-10-CM | POA: Diagnosis not present

## 2020-07-02 DIAGNOSIS — J9621 Acute and chronic respiratory failure with hypoxia: Secondary | ICD-10-CM | POA: Diagnosis not present

## 2020-07-02 DIAGNOSIS — C3490 Malignant neoplasm of unspecified part of unspecified bronchus or lung: Secondary | ICD-10-CM | POA: Diagnosis not present

## 2020-07-04 ENCOUNTER — Telehealth (INDEPENDENT_AMBULATORY_CARE_PROVIDER_SITE_OTHER): Payer: PPO | Admitting: Family Medicine

## 2020-07-04 ENCOUNTER — Other Ambulatory Visit: Payer: Self-pay

## 2020-07-04 DIAGNOSIS — L301 Dyshidrosis [pompholyx]: Secondary | ICD-10-CM | POA: Diagnosis not present

## 2020-07-04 MED ORDER — GABAPENTIN 100 MG PO CAPS
100.0000 mg | ORAL_CAPSULE | Freq: Three times a day (TID) | ORAL | 3 refills | Status: DC
Start: 2020-07-04 — End: 2020-11-30

## 2020-07-04 MED ORDER — MOMETASONE FUROATE 0.1 % EX CREA: 1.0000 "application " | TOPICAL_CREAM | Freq: Every day | CUTANEOUS | 0 refills | Status: DC

## 2020-07-04 NOTE — Progress Notes (Signed)
Subjective:    Patient ID: [name], female    DOB: 1941/01/26, 79 y.o.   MRN: [id number]  HPI Patient is being seen today as a telephone visit.  Phone call began at 850.  Phone call concluded at 9:00.  Patient is at home.  I am in my office.  Patient consents to be seen via telephone.  She reports dry cracked fissured skin on both hands.  This been present for several weeks.  She associates this with washing dishes.  This gets worse whenever she gets her hands wet.  She states that the rash itches and burns.  It is confined distal to the wrist bilaterally.  She reports scaly erythematous skin on the dorsums of both hands and also on the knuckles of her fingers.  She would also like some medication for neuropathic pain radiating down her legs from her back.  She has tried gabapentin in the past and she would be willing to try it again. Past Medical History:  Diagnosis Date  . Adenocarcinoma of lung ([place]) 12/2010   left lung/surg only  . Allergic rhinitis   . 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 Anxiety disorder   . Aortic aneurysm ([place])   . Brain metastasis ([place])    lung cancer s/p resection 7/20  . Cancer ([place])    Phreesia 05/26/2020  . Complication of anesthesia 2011   bloodpressure dropped during colonoscopy, not problems since  . Compression fracture   . COPD (chronic obstructive pulmonary disease) ([place])   . Depression   . Diastolic dysfunction   . Diverticulosis   . Dysphagia   . Emphysema lung ([place]) 11/08/2014  . Headache   . Hiatal hernia   . History of kidney stones   . Hypothyroidism   . IBS (irritable bowel syndrome)   . Iron deficiency anemia due to chronic blood loss 12/05/2016  . On home O2 12/15/2013   chronic hypoxia  . Pneumonia   . PUD (peptic ulcer disease)    19yrs  . Shortness of breath    with exertion  . SIADH (syndrome of inappropriate ADH production) ([place])   . Trigeminal neuralgia    Past Surgical History:   Procedure Laterality Date  . ABDOMINAL HYSTERECTOMY    . APPLICATION OF CRANIAL NAVIGATION N/A 03/18/2019   Procedure: APPLICATION OF CRANIAL NAVIGATION;  Surgeon: Consuella Lose, MD;  Location: [facility];  Service: Neurosurgery;  Laterality: N/A;  . BACK SURGERY  January 13, 2014  . BRAIN SURGERY N/A    Phreesia 03/28/2020  . CATARACT EXTRACTION    . CATARACT EXTRACTION W/PHACO  09/02/2012   Procedure: CATARACT EXTRACTION PHACO AND INTRAOCULAR LENS PLACEMENT (IOC);  Surgeon: Elta Guadeloupe T. Gershon Crane, MD;  Location: [facility];  Service: Ophthalmology;  Laterality: Left;  CDE:10.35  . CHOLECYSTECTOMY    . 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 COLONOSCOPY N/A 11/23/2014   Dr. Oneida Alar: moderate diverticulosis, hemorrhoids, redundant colon. next TCS in 10-15 years.   . CRANIOTOMY Left 03/18/2019   Procedure: Left stereotactic craniotomy for tumor resection;  Surgeon: Consuella Lose, MD;  Location: [facility];  Service: Neurosurgery;  Laterality: Left;  Left stereotactic craniotomy for tumor resection  . ENDARTERECTOMY Right 08/21/2013   Procedure: RIGHT CAROTID ANEURYSM RESECTION;  Surgeon: Rosetta Posner, MD;  Location: [facility];  Service: Vascular;  Laterality: Right;  . ESOPHAGEAL DILATION  02/24/2018   Procedure: ESOPHAGEAL DILATION;  Surgeon: Danie Binder, MD;  [facility];  Service: Endoscopy;;  . ESOPHAGOGASTRODUODENOSCOPY  11/13/2009   w/dilation to 62mm, gastric ulceration (H.Pylori) s/p treatment  . ESOPHAGOGASTRODUODENOSCOPY  11/21/2009   distal esophageal web, gastritis  . ESOPHAGOGASTRODUODENOSCOPY  03/14/12   RJJ:[id number] in the distal esophagus/Mild gastritis/small HH. + H.pylori, prescribed Pylera. Finished treatment.   . ESOPHAGOGASTRODUODENOSCOPY N/A 12/13/2017   Dr. Oneida Alar: esophageal web s/p dilation, gastritis, no h.pylori  . ESOPHAGOGASTRODUODENOSCOPY N/A 02/24/2018   Dr. Oneida Alar: web in prox esophagus and in distal esophagus s/p dilation, mild  gastritis, mild pyloric stenosis, mild post ulcer duodenal deformity at D1/D2  . fatty tumor removal from lt groin    . FRACTURE SURGERY Left    hand and left ring finger  . GIVENS CAPSULE STUDY N/A 12/26/2017   incomplete  . GIVENS CAPSULE STUDY N/A 02/24/2018   normal small bowel  . KNEE SURGERY     patella tendon repair june 2019 [facility]  . LUNG CANCER SURGERY  12/2010   Left VATS, minithoracotomy, LLL superior segmentectomy  . ORIF PATELLA Right 03/18/2018   Procedure: OPEN REDUCTION INTERNAL (ORIF) FIXATION RIGHT PATELLA;  Surgeon: Carole Civil, MD;  Location: [facility];  Service: Orthopedics;  Laterality: Right;  . ORIF WRIST FRACTURE Left 04/09/2014   Procedure: OPEN REDUCTION INTERNAL FIXATION (ORIF) WRIST FRACTURE;  Surgeon: Renette Butters, MD;  Location: [facility];  Service: Orthopedics;  Laterality: Left;  . PERCUTANEOUS PINNING Left 04/09/2014   Procedure: PERCUTANEOUS PINNING EXTREMITY;  Surgeon: Renette Butters, MD;  Location: [facility];  Service: Orthopedics;  Laterality: Left;  . SAVORY DILATION N/A 12/13/2017   Procedure: SAVORY DILATION;  Surgeon: Danie Binder, MD;  [facility];  Service: Endoscopy;  Laterality: N/A;  . TUBAL LIGATION    . vocal cord surgery  02/06/2011   laryngoscopy with bilateral vocal cord Radiesse injection for vocal cord paralysis  . YAG LASER APPLICATION Left 01/60/1093   Procedure: YAG LASER APPLICATION;  Surgeon: Rutherford Guys, MD;  Location: [facility];  Service: Ophthalmology;  Laterality: Left;   Current Outpatient Medications on File Prior to Visit  Medication Sig Dispense Refill  . albuterol (VENTOLIN HFA) 108 (90 Base) MCG/ACT inhaler Inhale 2 puffs into the lungs every 6 (six) hours as needed for wheezing or shortness of breath. 18 g 2  . ALPRAZolam (XANAX) 0.5 MG tablet TAKE (1) TABLET BY MOUTH TWICE A DAY AS NEEDED. 60 tablet 3  . cephALEXin (KEFLEX) 500 MG capsule Take 1 capsule (500 mg total) by mouth 3 (three) times daily. 21 capsule  0  . citalopram (CELEXA) 40 MG tablet TAKE (1/2) TABLET BY MOUTH AT BEDTIME. 45 tablet 5  . denosumab (PROLIA) 60 MG/ML SOSY injection Inject 60 mg into the skin every 6 (six) months. 1 mL 0  . diphenhydrAMINE (BENADRYL) 50 MG tablet Take 1 tablet (50 mg total) by mouth once for 1 dose. Take one hour prior to CT scan 1 tablet 0  . ferrous sulfate 325 (65 FE) MG tablet Take 325 mg by mouth daily with breakfast.    . fluticasone (FLONASE) 50 MCG/ACT nasal spray INSTILL 2 SPRAYS INTO BOTH NOSTRILS DAILY. 16 g 0  . levocetirizine (XYZAL) 5 MG tablet TAKE ONE TABLET BY MOUTH ONCE DAILY. 30 tablet 0  . levothyroxine (SYNTHROID) 50 MCG tablet TAKE 1 TABLET BEFORE BREAKFAST. 90 tablet 0  . Multiple Vitamin (MULTIVITAMIN WITH MINERALS) TABS tablet Take 1 tablet by mouth daily.    Marland Kitchen  ondansetron (ZOFRAN) 4 MG tablet TAKE 1 TABLET BY MOUTH 3 TIMES DAILY AS NEEDED FOR NAUSEA AND VOMITING. 90 tablet 0  . OVER THE COUNTER MEDICATION Bone Support 2-4 tabs daily    . pantoprazole (PROTONIX) 40 MG tablet TAKE (1) TABLET BY MOUTH ONCE DAILY BEFORE BREAKFAST. 90 tablet 3  . predniSONE (DELTASONE) 10 MG tablet TAKE AS DIRECTED PERHPACKAGE INSTRUCTIONS.    Marland Kitchen predniSONE (DELTASONE) 50 MG tablet Please take one tablet at 13 hours, 7 hours and one hour prior to CT scan 3 tablet 0  . Probiotic Product (ALIGN PO) Take by mouth daily.    . TRELEGY ELLIPTA 100-62.5-25 MCG/INH AEPB INHALE 1 PUFF INTO LUNGS DAILY. 60 each 0   No current facility-administered medications on file prior to visit.   Allergies  Allergen Reactions  . Ciprofloxacin Hcl Hives  . Sulfonamide Derivatives Hives and Rash  . Iohexol Other (See Comments)    UNSPECIFIED REACTION  Desc: Per [facility], pt is allergic to IV contrast, no type of reaction was available. Pt cannot remember but first reacted around 15 yrs ago from an IVP. Notes were date from 2009 at urology center., Onset Date: [id number]   . Iodinated Diagnostic Agents Rash and  Itching  . Prozac [Fluoxetine Hcl] Rash   Social History   Socioeconomic History  . Marital status: Divorced    Spouse name: Not on file  . Number of children: Not on file  . Years of education: Not on file  . Highest education level: Not on file  Occupational History  . Not on file  Tobacco Use  . Smoking status: Former Smoker    Packs/day: 0.50    Years: 20.00    Pack years: 10.00    Types: Cigarettes    Quit date: 12/21/2010    Years since quitting: 9.5  . Smokeless tobacco: Never Used  . Tobacco comment: smoking cessation info given and reviewed   Vaping Use  . Vaping Use: Never used  Substance and Sexual Activity  . Alcohol use: Not Currently    Alcohol/week: 0.0 standard drinks  . Drug use: No  . Sexual activity: Yes    Birth control/protection: Surgical  Other Topics Concern  . Not on file  Social History Narrative   Divorced since 17.Lives alone with a dog.Marland KitchenRetired.  She receives Meals on Wheels.   Social Determinants of Health   Financial Resource Strain: Low Risk   . Difficulty of Paying Living Expenses: Not hard at all  Food Insecurity:   . Worried About Charity fundraiser in the Last Year: Not on file  . Ran Out of Food in the Last Year: Not on file  Transportation Needs:   . Lack of Transportation (Medical): Not on file  . Lack of Transportation (Non-Medical): Not on file  Physical Activity:   . Days of Exercise per Week: Not on file  . Minutes of Exercise per Session: Not on file  Stress:   . Feeling of Stress : Not on file  Social Connections:   . Frequency of Communication with Friends and Family: Not on file  . Frequency of Social Gatherings with Friends and Family: Not on file  . Attends Religious Services: Not on file  . Active Member of Clubs or Organizations: Not on file  . Attends Archivist Meetings: Not on file  . Marital Status: Not on file  Intimate Partner Violence:   . Fear of Current or Ex-Partner: Not on file  .  Emotionally Abused: Not on file  . Physically Abused: Not on file  . Sexually Abused: Not on file      Review of Systems  All other systems reviewed and are negative.      Objective:   Physical Exam        Assessment & Plan:  Dyshidrotic eczema  Difficult to tell on a phone call but I suspect dyshidrotic eczema.  Try Elocon cream applied once daily and use moisturizer such as Vaseline during the day.  Reassess in 1 week if no better or sooner if worse.  She can use gabapentin 100 mg p.o. 3 times daily as needed nerve pain for the pain in her back and legs

## 2020-07-04 NOTE — Telephone Encounter (Signed)
Pt had virtual appt with provider on 07-04-20

## 2020-07-13 IMAGING — MG DIGITAL SCREENING BILATERAL MAMMOGRAM WITH TOMO AND CAD
8 series · 9 of 24 positions shown · non-contrast
Comparison: Previous exam(s).

CLINICAL DATA: Screening.

EXAM:
DIGITAL SCREENING BILATERAL MAMMOGRAM WITH TOMO AND CAD

[R CC synth-2D]
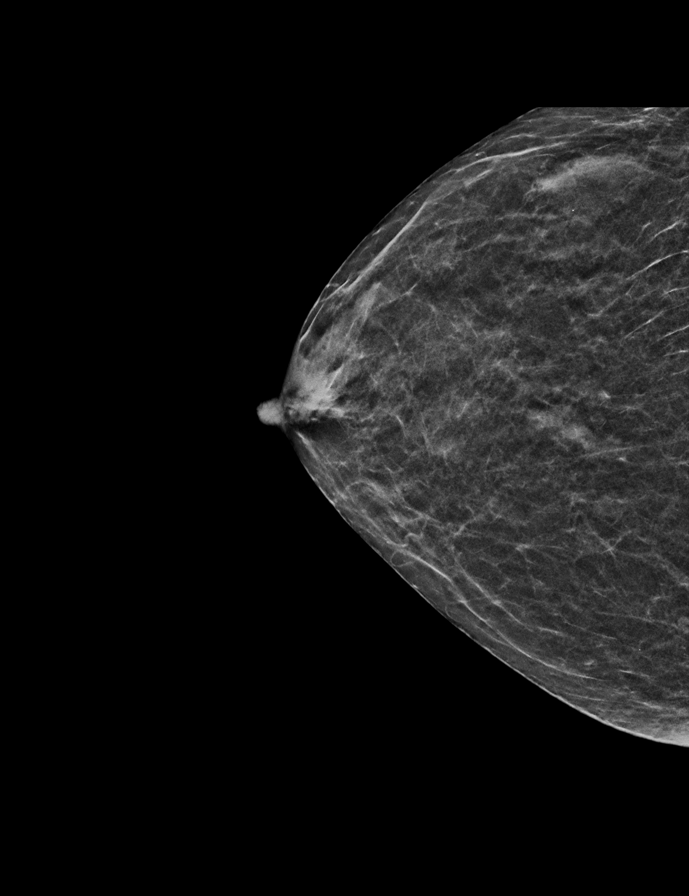

[L CC synth-2D]
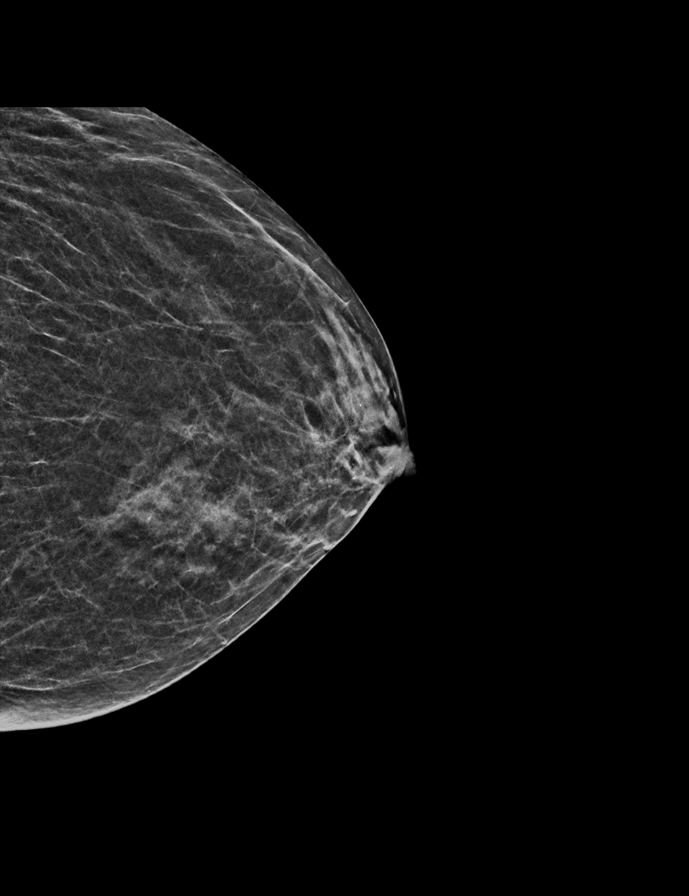

[L MLO synth-2D]
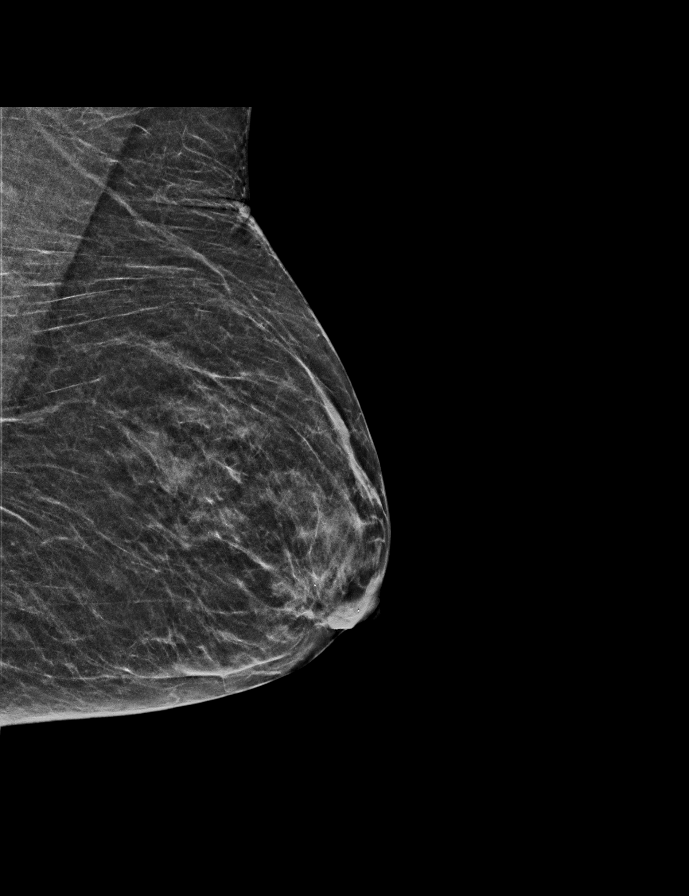

[R MLO synth-2D]
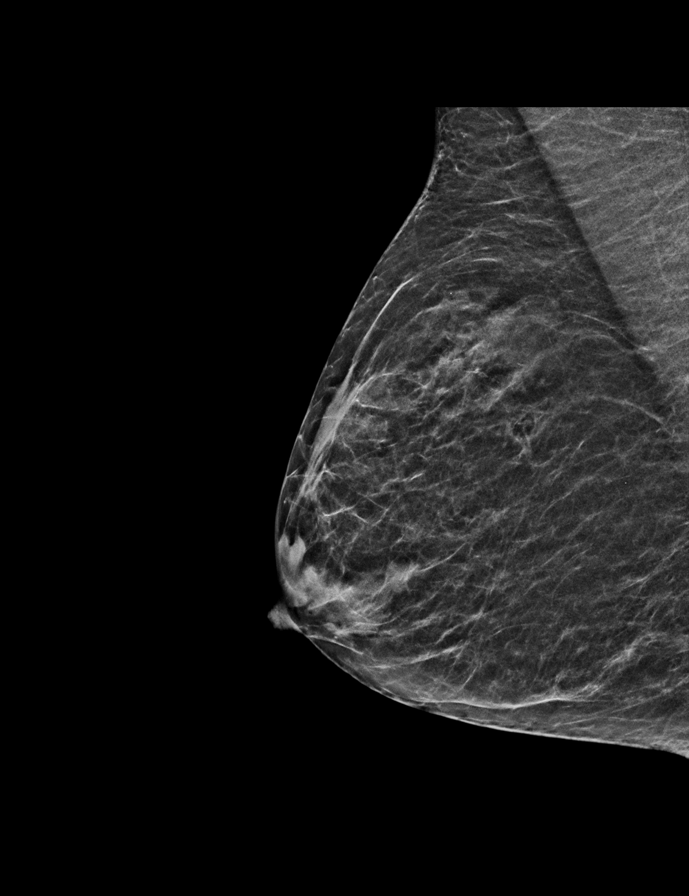

[R MLO tomo · 2 of 39 frames shown]
[frame 13/39]
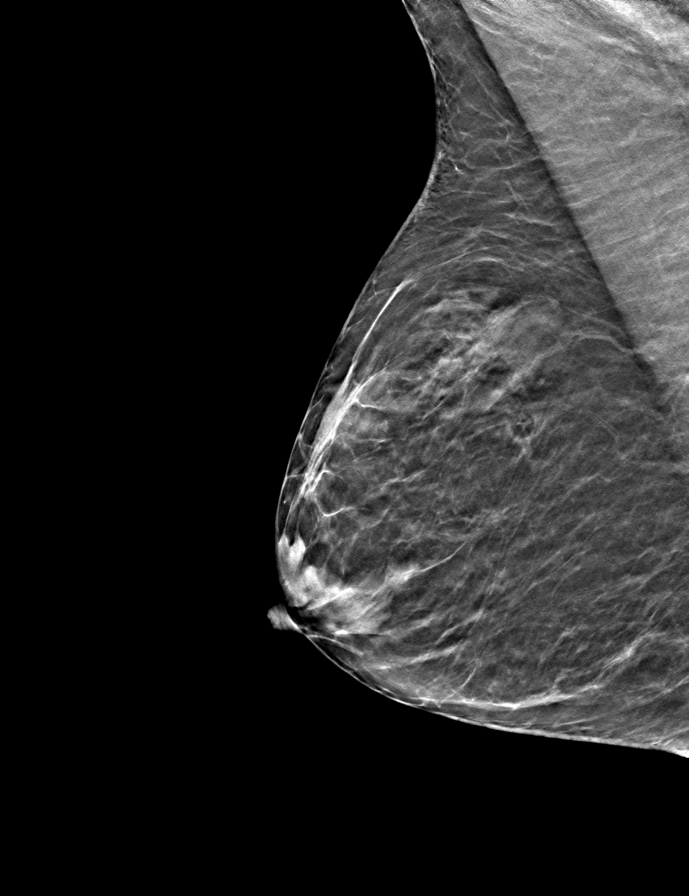
[frame 20/39]
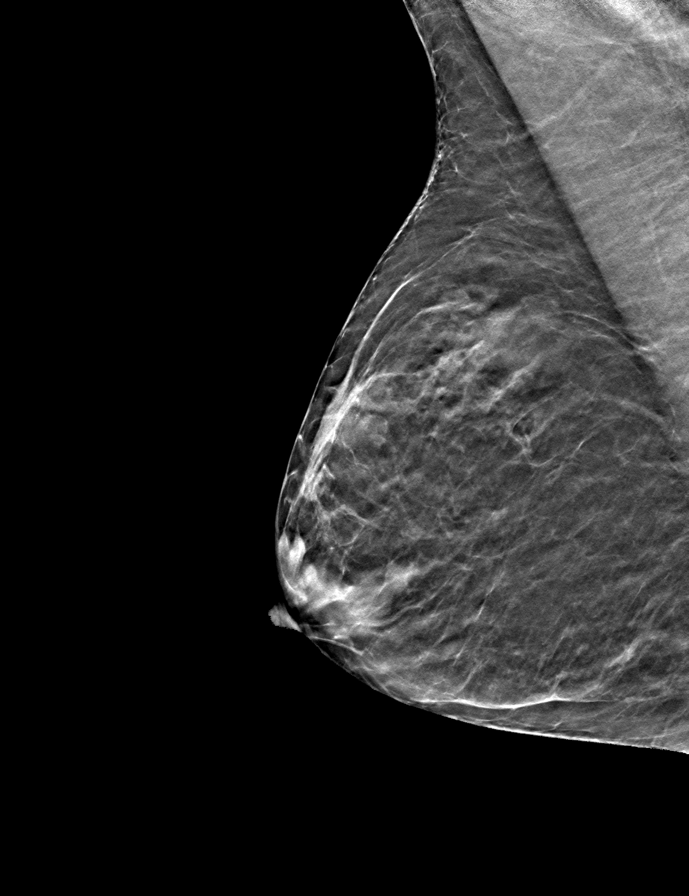

[L CC tomo · tomo slice 18/35.0]
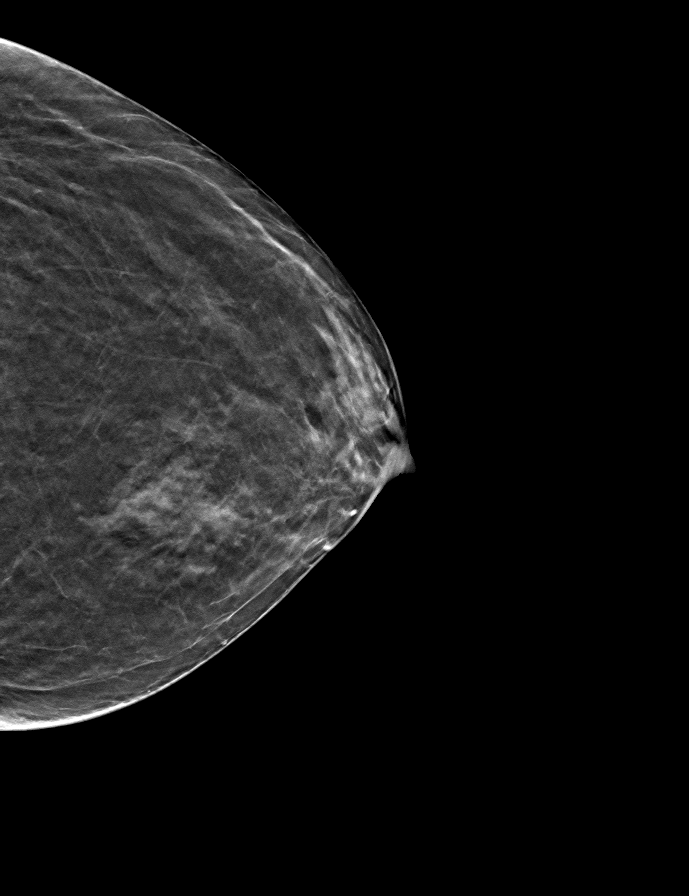

[L MLO tomo · tomo slice 19/38.0]
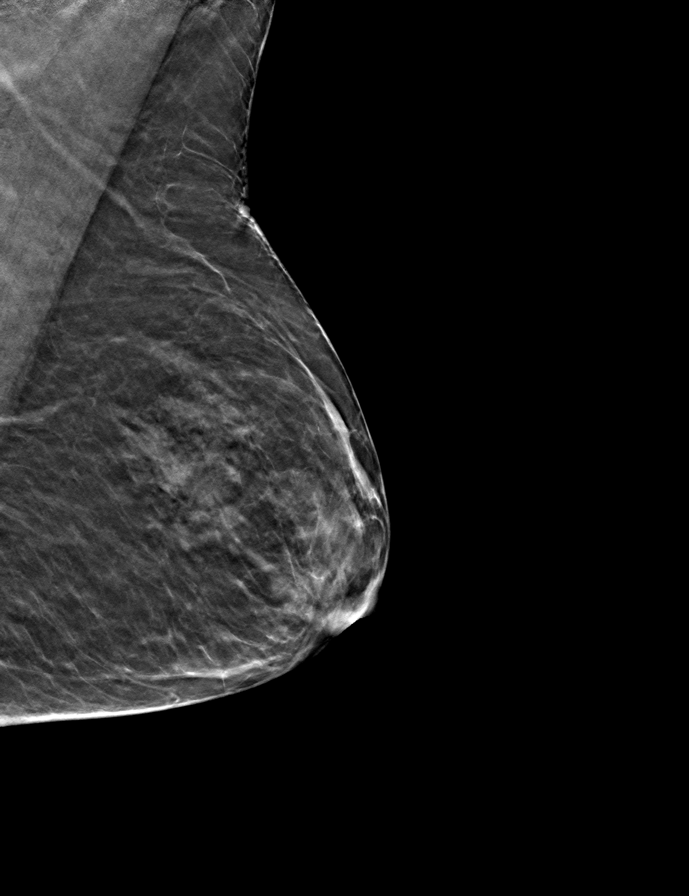

[R CC tomo · tomo slice 19/36.0]
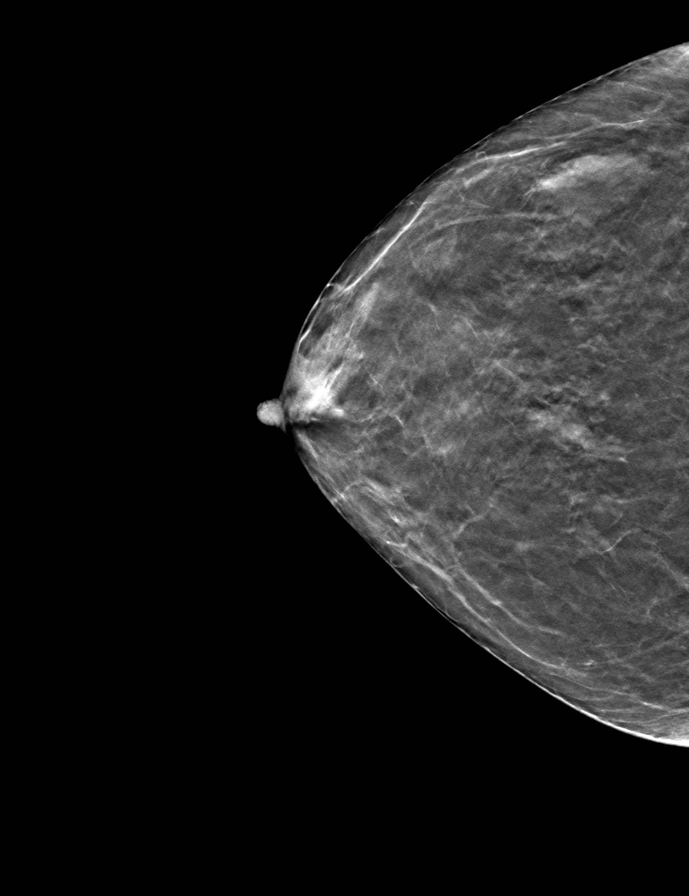

[9 of 24 positions shown; findings below may reference images not displayed]

ACR Breast Density Category b: There are scattered areas of
fibroglandular density.
FINDINGS: There are no findings suspicious for malignancy. Images were
processed with CAD.
IMPRESSION: No mammographic evidence of malignancy. A result letter of this
screening mammogram will be mailed directly to the patient.

RECOMMENDATION:
Screening mammogram in one year. (Code:[id number])

BI-RADS CATEGORY  1: Negative.

## 2020-07-14 ENCOUNTER — Other Ambulatory Visit: Payer: Self-pay | Admitting: Family Medicine

## 2020-07-19 ENCOUNTER — Ambulatory Visit (INDEPENDENT_AMBULATORY_CARE_PROVIDER_SITE_OTHER): Payer: PPO | Admitting: Nurse Practitioner

## 2020-07-20 ENCOUNTER — Ambulatory Visit (INDEPENDENT_AMBULATORY_CARE_PROVIDER_SITE_OTHER): Payer: PPO

## 2020-07-20 ENCOUNTER — Other Ambulatory Visit: Payer: Self-pay

## 2020-07-20 DIAGNOSIS — Z23 Encounter for immunization: Secondary | ICD-10-CM | POA: Diagnosis not present

## 2020-07-26 ENCOUNTER — Other Ambulatory Visit: Payer: Self-pay | Admitting: Internal Medicine

## 2020-07-26 ENCOUNTER — Telehealth: Payer: Self-pay | Admitting: Medical Oncology

## 2020-07-26 ENCOUNTER — Telehealth: Payer: Self-pay | Admitting: Internal Medicine

## 2020-07-26 DIAGNOSIS — C7931 Secondary malignant neoplasm of brain: Secondary | ICD-10-CM

## 2020-07-26 DIAGNOSIS — G500 Trigeminal neuralgia: Secondary | ICD-10-CM

## 2020-07-26 NOTE — Telephone Encounter (Addendum)
"  My Trigeminal neuralgia has been  active x1 month"   She is symptomatic/reporting:  - pain on " left side in jaw  when she chews or uses her tongue".  Leaning head to right has helped in past ,but does not help now.   Taking Trileptal 150 mg bid.,but not helping  ( Dr Dennard Schaumann)   -Speech affected because she has to hold her tongue to talk and swallow.When she tries to talk she slobers. She can swallow ok but has to soften food .  "it is affecting me mentally".   " Could something be  going on that is pressing on nerve" ?  Per Dr Mickeal Skinner , I instructed pt that he is ordering a scan and f/u visit.  Pt instructions given - -Lorisa will contact Dr Dennard Schaumann and let him know the trileptal is not working  -call for worsening symptoms. Pt voiced understanding.Marland Kitchen

## 2020-07-26 NOTE — Telephone Encounter (Signed)
Scheduled appt per 10/19 sch msg  - pt is aware of appt date and time

## 2020-07-28 ENCOUNTER — Other Ambulatory Visit: Payer: Self-pay | Admitting: Radiation Therapy

## 2020-07-28 ENCOUNTER — Other Ambulatory Visit: Payer: Self-pay | Admitting: Family Medicine

## 2020-08-01 ENCOUNTER — Other Ambulatory Visit: Payer: Self-pay | Admitting: *Deleted

## 2020-08-01 DIAGNOSIS — C3490 Malignant neoplasm of unspecified part of unspecified bronchus or lung: Secondary | ICD-10-CM | POA: Diagnosis not present

## 2020-08-01 DIAGNOSIS — R0902 Hypoxemia: Secondary | ICD-10-CM | POA: Diagnosis not present

## 2020-08-01 DIAGNOSIS — J9621 Acute and chronic respiratory failure with hypoxia: Secondary | ICD-10-CM | POA: Diagnosis not present

## 2020-08-01 DIAGNOSIS — J449 Chronic obstructive pulmonary disease, unspecified: Secondary | ICD-10-CM | POA: Diagnosis not present

## 2020-08-03 ENCOUNTER — Other Ambulatory Visit: Payer: Self-pay | Admitting: Family Medicine

## 2020-08-05 ENCOUNTER — Other Ambulatory Visit: Payer: Self-pay

## 2020-08-05 ENCOUNTER — Ambulatory Visit
Admission: RE | Admit: 2020-08-05 | Discharge: 2020-08-05 | Disposition: A | Discharge status: Home / Self Care | Payer: PPO | Source: Ambulatory Visit | Attending: Internal Medicine | Admitting: Internal Medicine

## 2020-08-05 DIAGNOSIS — G500 Trigeminal neuralgia: Secondary | ICD-10-CM

## 2020-08-05 DIAGNOSIS — C7931 Secondary malignant neoplasm of brain: Secondary | ICD-10-CM

## 2020-08-05 MED ORDER — GADOBENATE DIMEGLUMINE 529 MG/ML IV SOLN
10.0000 mL | Freq: Once | INTRAVENOUS | Status: AC | PRN
  Administered 2020-08-05: 10 mL via INTRAVENOUS

## 2020-08-08 ENCOUNTER — Inpatient Hospital Stay: Payer: PPO | Attending: Internal Medicine

## 2020-08-08 DIAGNOSIS — C7931 Secondary malignant neoplasm of brain: Secondary | ICD-10-CM | POA: Insufficient documentation

## 2020-08-08 DIAGNOSIS — E039 Hypothyroidism, unspecified: Secondary | ICD-10-CM | POA: Insufficient documentation

## 2020-08-08 DIAGNOSIS — J449 Chronic obstructive pulmonary disease, unspecified: Secondary | ICD-10-CM | POA: Insufficient documentation

## 2020-08-08 DIAGNOSIS — F419 Anxiety disorder, unspecified: Secondary | ICD-10-CM | POA: Insufficient documentation

## 2020-08-08 DIAGNOSIS — Z87891 Personal history of nicotine dependence: Secondary | ICD-10-CM | POA: Insufficient documentation

## 2020-08-08 DIAGNOSIS — E222 Syndrome of inappropriate secretion of antidiuretic hormone: Secondary | ICD-10-CM | POA: Insufficient documentation

## 2020-08-08 DIAGNOSIS — I720 Aneurysm of carotid artery: Secondary | ICD-10-CM | POA: Insufficient documentation

## 2020-08-08 DIAGNOSIS — F32A Depression, unspecified: Secondary | ICD-10-CM | POA: Insufficient documentation

## 2020-08-08 DIAGNOSIS — I719 Aortic aneurysm of unspecified site, without rupture: Secondary | ICD-10-CM | POA: Insufficient documentation

## 2020-08-08 DIAGNOSIS — C3432 Malignant neoplasm of lower lobe, left bronchus or lung: Secondary | ICD-10-CM | POA: Insufficient documentation

## 2020-08-09 ENCOUNTER — Inpatient Hospital Stay (HOSPITAL_BASED_OUTPATIENT_CLINIC_OR_DEPARTMENT_OTHER): Payer: PPO | Admitting: Internal Medicine

## 2020-08-09 ENCOUNTER — Other Ambulatory Visit: Payer: Self-pay

## 2020-08-09 VITALS — BP 117/70 | HR 93 | Temp 98.7°F | Resp 18 | Ht 60.0 in | Wt 113.1 lb

## 2020-08-09 DIAGNOSIS — F419 Anxiety disorder, unspecified: Secondary | ICD-10-CM | POA: Diagnosis not present

## 2020-08-09 DIAGNOSIS — C7931 Secondary malignant neoplasm of brain: Secondary | ICD-10-CM | POA: Diagnosis not present

## 2020-08-09 DIAGNOSIS — F32A Depression, unspecified: Secondary | ICD-10-CM | POA: Diagnosis not present

## 2020-08-09 DIAGNOSIS — C3432 Malignant neoplasm of lower lobe, left bronchus or lung: Secondary | ICD-10-CM | POA: Diagnosis not present

## 2020-08-09 DIAGNOSIS — E039 Hypothyroidism, unspecified: Secondary | ICD-10-CM | POA: Diagnosis not present

## 2020-08-09 DIAGNOSIS — I719 Aortic aneurysm of unspecified site, without rupture: Secondary | ICD-10-CM | POA: Diagnosis not present

## 2020-08-09 DIAGNOSIS — I720 Aneurysm of carotid artery: Secondary | ICD-10-CM | POA: Diagnosis not present

## 2020-08-09 DIAGNOSIS — Z87891 Personal history of nicotine dependence: Secondary | ICD-10-CM | POA: Diagnosis not present

## 2020-08-09 DIAGNOSIS — J449 Chronic obstructive pulmonary disease, unspecified: Secondary | ICD-10-CM | POA: Diagnosis not present

## 2020-08-09 DIAGNOSIS — E222 Syndrome of inappropriate secretion of antidiuretic hormone: Secondary | ICD-10-CM | POA: Diagnosis not present

## 2020-08-09 NOTE — Progress Notes (Signed)
[facility] [place], [place] [address] [phone]  Interval Evaluation  Date of Service: 08/09/20 Patient Name: [name] Patient MRN: [id number] Patient DOB: 14-Aug-1941 Provider: Ventura Sellers, MD  Identifying Statement:  [name] is a 79 y.o. 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 0/14 lymph nodes.  No perineural invasion or LVI.  negative margins.   06/05/2016 Imaging   Although the previously described ground-glass attenuation nodule in the superior segment of the right lower lobe measures slightly larger on today's examination than prior studies, this is favored to be technique related. Today's study should serve as a baseline for future followup examinations. At this time, there is no central solid component to this lesion. No new nodules are noted. 2. Stable postoperative scarring in the left lower lobe and post infectious scarring throughout the visualize lung bases, as above. 3. Areas of cylindrical bronchiectasis throughout the lung bases bilaterally    05/09/2017 Imaging   IMPRESSION: 1. Status post left lower lobe wedge resection. No findings to suggest local recurrence of disease or definite metastatic disease in the thorax. Previously noted ground-glass attenuation nodule in the superior segment of the right lower lobe appears slightly smaller than the prior study, suggesting a benign etiology. 2. Diffuse bronchial wall thickening with moderate to severe centrilobular and paraseptal emphysema; imaging findings suggestive of underlying COPD. 3. Aortic atherosclerosis, in addition to left main and left circumflex coronary artery disease. Assessment for potential risk factor  modification, dietary therapy or pharmacologic therapy may be warranted, if clinically indicated. 4. Increasing anterior pericardial fluid and/or thickening. This is unlikely to be of hemodynamic significance at this time. No associated pericardial calcification. 5. New (but non acute) compression fracture of T6 with approximately 90% loss of anterior vertebral body height, in addition to multiple other chronic compression fractures of the visualize the thoracolumbar spine.   03/22/2020 Genetic Testing   PD-L1     03/29/2020 Genetic Testing   Foundation One       CNS Oncologic History 03/17/19: Left parietal pre-op SRS, plus SRS to 4 additional smaller targets 03/18/19: Left parietal craniotomy for resection of tumor with Dr. Kathyrn Sheriff  01/01/20: SRS to 2 additional sub-cm metastases 05/20/20: SRS to 2 further metastases  Interval History: The patient's records from the referring physician were obtained and reviewed and the patient interviewed to confirm this HPI.  [name] presents following recent MRI brain.  Denies new or progressive neurologic deficits.  Right sided visual field impairment in static as prior since surgery.  Still utilizes walker for ambulation.  No seizures or headaches.  Not currently on dexamethasone.  Medications: Current Outpatient Medications on File Prior to Visit  Medication Sig Dispense Refill  . albuterol (VENTOLIN HFA) 108 (90 Base) MCG/ACT inhaler Inhale 2 puffs into the lungs every 6 (six) hours as needed for wheezing or shortness of breath. 18 g 2  . ALPRAZolam (XANAX) 0.5 MG tablet TAKE (1) TABLET BY MOUTH TWICE A DAY AS NEEDED. 60 tablet 3  . cephALEXin (KEFLEX) 500 MG capsule Take 1 capsule (500 mg total) by mouth 3 (three) times daily. 21 capsule 0  . citalopram (CELEXA) 40 MG tablet TAKE (1/2) TABLET BY MOUTH AT BEDTIME. 45 tablet 5  .  denosumab (PROLIA) 60 MG/ML SOSY injection Inject 60 mg into the skin every 6 (six) months. 1 mL 0  .  diphenhydrAMINE (BENADRYL) 50 MG tablet Take 1 tablet (50 mg total) by mouth once for 1 dose. Take one hour prior to CT scan 1 tablet 0  . ferrous sulfate 325 (65 FE) MG tablet Take 325 mg by mouth daily with breakfast.    . fluticasone (FLONASE) 50 MCG/ACT nasal spray INSTILL 2 SPRAYS INTO BOTH NOSTRILS DAILY. 16 g 0  . gabapentin (NEURONTIN) 100 MG capsule Take 1 capsule (100 mg total) by mouth 3 (three) times daily. 90 capsule 3  . levocetirizine (XYZAL) 5 MG tablet TAKE ONE TABLET BY MOUTH ONCE DAILY. 30 tablet 0  . levothyroxine (SYNTHROID) 50 MCG tablet TAKE 1 TABLET BEFORE BREAKFAST. 90 tablet 0  . mometasone (ELOCON) 0.1 % cream Apply 1 application topically daily. 45 g 0  . Multiple Vitamin (MULTIVITAMIN WITH MINERALS) TABS tablet Take 1 tablet by mouth daily.    . ondansetron (ZOFRAN) 4 MG tablet TAKE 1 TABLET BY MOUTH 3 TIMES DAILY AS NEEDED FOR NAUSEA AND VOMITING. 90 tablet 0  . OVER THE COUNTER MEDICATION Bone Support 2-4 tabs daily    . OXcarbazepine (TRILEPTAL) 150 MG tablet Take 150 mg by mouth 2 (two) times daily.    . pantoprazole (PROTONIX) 40 MG tablet TAKE (1) TABLET BY MOUTH ONCE DAILY BEFORE BREAKFAST. 90 tablet 3  . predniSONE (DELTASONE) 10 MG tablet TAKE AS DIRECTED PERHPACKAGE INSTRUCTIONS.    Marland Kitchen predniSONE (DELTASONE) 50 MG tablet Please take one tablet at 13 hours, 7 hours and one hour prior to CT scan 3 tablet 0  . Probiotic Product (ALIGN PO) Take by mouth daily.    . TRELEGY ELLIPTA 100-62.5-25 MCG/INH AEPB INHALE 1 PUFF INTO LUNGS DAILY. 60 each 2   No current facility-administered medications on file prior to visit.    Allergies:  Allergies  Allergen Reactions  . Ciprofloxacin Hcl Hives  . Sulfonamide Derivatives Hives and Rash  . Iohexol Other (See Comments)    UNSPECIFIED REACTION  Desc: Per [facility], pt is allergic to IV contrast, no type of reaction was available. Pt cannot remember but first reacted around 15 yrs ago from an IVP. Notes were  date from 2009 at urology center., Onset Date: [id number]   . Iodinated Diagnostic Agents Rash and Itching  . Prozac [Fluoxetine Hcl] Rash   Past Medical History:  Past Medical History:  Diagnosis Date  . 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 Allergic rhinitis   . 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 Anxiety disorder   . Aortic aneurysm ([place])   . Brain metastasis ([place])    lung cancer s/p resection 7/20  . Cancer ([place])    Phreesia 05/26/2020  . Complication of anesthesia 2011   bloodpressure dropped during colonoscopy, not problems since  . Compression fracture   . COPD (chronic obstructive pulmonary disease) ([place])   . Depression   . Diastolic dysfunction   . Diverticulosis   . Dysphagia   . Emphysema lung ([place]) 11/08/2014  . Headache   . Hiatal hernia   . History of kidney stones   . Hypothyroidism   . IBS (irritable bowel syndrome)   . Iron deficiency anemia due to chronic blood loss 12/05/2016  . On home O2 12/15/2013   chronic hypoxia  . Pneumonia   . PUD (peptic ulcer disease)  51yr  . Shortness of breath    with exertion  . SIADH (syndrome of inappropriate ADH production) ([place]   . Trigeminal neuralgia    Past Surgical History:  Past Surgical History:  Procedure Laterality Date  . ABDOMINAL HYSTERECTOMY    . APPLICATION OF CRANIAL NAVIGATION N/A 03/18/2019   Procedure: APPLICATION OF CRANIAL NAVIGATION;  Surgeon: NConsuella Lose MD;  [facility]  Service: Neurosurgery;  Laterality: N/A;  . BACK SURGERY  January 13, 2014  . BRAIN SURGERY N/A    Phreesia 03/28/2020  . CATARACT EXTRACTION    . CATARACT EXTRACTION W/PHACO  09/02/2012   Procedure: CATARACT EXTRACTION PHACO AND INTRAOCULAR LENS PLACEMENT (IOC);  Surgeon: MElta GuadeloupeT. SGershon Crane MD;  Location: [facility];  Service: Ophthalmology;  Laterality: Left;  CDE:10.35  . CHOLECYSTECTOMY    . COLONOSCOPY  2011   hyperplastic polyp, 3-4 small cecal AVMs,  nonbleeding  . COLONOSCOPY N/A 11/23/2014   Dr. FOneida Alar moderate diverticulosis, hemorrhoids, redundant colon. next TCS in 10-15 years.   . CRANIOTOMY Left 03/18/2019   Procedure: Left stereotactic craniotomy for tumor resection;  Surgeon: NConsuella Lose MD;  [facility]  Service: Neurosurgery;  Laterality: Left;  Left stereotactic craniotomy for tumor resection  . ENDARTERECTOMY Right 08/21/2013   Procedure: RIGHT CAROTID ANEURYSM RESECTION;  Surgeon: TRosetta Posner MD;  [facility]  Service: Vascular;  Laterality: Right;  . ESOPHAGEAL DILATION  02/24/2018   Procedure: ESOPHAGEAL DILATION;  Surgeon: FDanie Binder MD;  [facility];  Service: Endoscopy;;  . ESOPHAGOGASTRODUODENOSCOPY  11/13/2009   w/dilation to 170m gastric ulceration (H.Pylori) s/p treatment  . ESOPHAGOGASTRODUODENOSCOPY  11/21/2009   distal esophageal web, gastritis  . ESOPHAGOGASTRODUODENOSCOPY  03/14/12   SLUDJ:[id number] the distal esophagus/Mild gastritis/small HH. + H.pylori, prescribed Pylera. Finished treatment.   . ESOPHAGOGASTRODUODENOSCOPY N/A 12/13/2017   Dr. FiOneida Alaresophageal web s/p dilation, gastritis, no h.pylori  . ESOPHAGOGASTRODUODENOSCOPY N/A 02/24/2018   Dr. FiOneida Alarweb in prox esophagus and in distal esophagus s/p dilation, mild gastritis, mild pyloric stenosis, mild post ulcer duodenal deformity at D1/D2  . fatty tumor removal from lt groin    . FRACTURE SURGERY Left    hand and left ring finger  . GIVENS CAPSULE STUDY N/A 12/26/2017   incomplete  . GIVENS CAPSULE STUDY N/A 02/24/2018   normal small bowel  . KNEE SURGERY     patella tendon repair june 2019 [facility]  . LUNG CANCER SURGERY  12/2010   Left VATS, minithoracotomy, LLL superior segmentectomy  . ORIF PATELLA Right 03/18/2018   Procedure: OPEN REDUCTION INTERNAL (ORIF) FIXATION RIGHT PATELLA;  Surgeon: HaCarole CivilMD;  Location: [facility];  Service: Orthopedics;  Laterality: Right;  . ORIF WRIST FRACTURE Left  04/09/2014   Procedure: OPEN REDUCTION INTERNAL FIXATION (ORIF) WRIST FRACTURE;  Surgeon: TiRenette ButtersMD;  [facility] Service: Orthopedics;  Laterality: Left;  . PERCUTANEOUS PINNING Left 04/09/2014   Procedure: PERCUTANEOUS PINNING EXTREMITY;  Surgeon: TiRenette ButtersMD;  [facility] Service: Orthopedics;  Laterality: Left;  . SAVORY DILATION N/A 12/13/2017   Procedure: SAVORY DILATION;  Surgeon: FiDanie BinderMD;  [facility];  Service: Endoscopy;  Laterality: N/A;  . TUBAL LIGATION    . vocal cord surgery  02/06/2011   laryngoscopy with bilateral vocal cord Radiesse injection for vocal cord paralysis  . YAG LASER APPLICATION Left [id number] Procedure: YAG LASER APPLICATION;  Surgeon: MaRutherford GuysMD;  Location:  [facility];  Service: Ophthalmology;  Laterality: Left;   Social History:  Social History   Socioeconomic History  . Marital status: Divorced    Spouse name: Not on file  . Number of children: Not on file  . Years of education: Not on file  . Highest education level: Not on file  Occupational History  . Not on file  Tobacco Use  . Smoking status: Former Smoker    Packs/day: 0.50    Years: 20.00    Pack years: 10.00    Types: Cigarettes    Quit date: 12/21/2010    Years since quitting: 9.6  . Smokeless tobacco: Never Used  . Tobacco comment: smoking cessation info given and reviewed   Vaping Use  . Vaping Use: Never used  Substance and Sexual Activity  . Alcohol use: Not Currently    Alcohol/week: 0.0 standard drinks  . Drug use: No  . Sexual activity: Yes    Birth control/protection: Surgical  Other Topics Concern  . Not on file  Social History Narrative   Divorced since 23.Lives alone with a dog.Marland KitchenRetired.  She receives Meals on Wheels.   Social Determinants of Health   Financial Resource Strain: Low Risk   . Difficulty of Paying Living Expenses: Not hard at all  Food Insecurity:   . Worried About Charity fundraiser in the  Last Year: Not on file  . Ran Out of Food in the Last Year: Not on file  Transportation Needs:   . Lack of Transportation (Medical): Not on file  . Lack of Transportation (Non-Medical): Not on file  Physical Activity:   . Days of Exercise per Week: Not on file  . Minutes of Exercise per Session: Not on file  Stress:   . Feeling of Stress : Not on file  Social Connections:   . Frequency of Communication with Friends and Family: Not on file  . Frequency of Social Gatherings with Friends and Family: Not on file  . Attends Religious Services: Not on file  . Active Member of Clubs or Organizations: Not on file  . Attends Archivist Meetings: Not on file  . Marital Status: Not on file  Intimate Partner Violence:   . Fear of Current or Ex-Partner: Not on file  . Emotionally Abused: Not on file  . Physically Abused: Not on file  . Sexually Abused: Not on file   Family History:  Family History  Problem Relation Age of Onset  . Pulmonary embolism Mother   . Heart attack Father   . Hypertension Father   . Liver cancer Sister   . Cancer Sister   . Cancer Brother   . Cancer Daughter   . Breast cancer Daughter   . Colon cancer Neg Hx     Review of Systems: Constitutional: Doesn't report fevers, chills or abnormal weight loss Eyes: Doesn't report blurriness of vision Ears, nose, mouth, throat, and face: Doesn't report sore throat Respiratory: Doesn't report cough, dyspnea or wheezes Cardiovascular: Doesn't report palpitation, chest discomfort  Gastrointestinal:  Doesn't report nausea, constipation, diarrhea GU: Doesn't report incontinence Skin: Doesn't report skin rashes Neurological: Per HPI Musculoskeletal: Doesn't report joint pain Behavioral/Psych: Doesn't report anxiety  Physical Exam: Vitals:   08/09/20 1149  BP: 117/70  Pulse: 93  Resp: 18  Temp: 98.7 F (37.1 C)  SpO2: 90%   KPS: 80. General: Alert, cooperative, pleasant, in no acute distress Head:  Normal EENT: No conjunctival injection or scleral icterus.  Lungs: Resp  effort normal Cardiac: Regular rate Abdomen: Non-distended abdomen Skin: No rashes cyanosis or petechiae. Extremities: No clubbing or edema  Neurologic Exam: Mental Status: Awake, alert, attentive to examiner. Oriented to self and environment. Language is fluent with intact comprehension.  Cranial Nerves: Visual acuity is grossly normal. Right homonymous hemianopia. Extra-ocular movements intact. No ptosis. Face is symmetric Motor: Tone and bulk are normal. Power is full in both arms and legs. Reflexes are symmetric, no pathologic reflexes present.  Sensory: Intact to light touch Gait: Normal.   Labs: I have reviewed the data as listed    Component Value Date/Time   NA 132 (L) 06/20/2020 0949   K 4.6 06/20/2020 0949   CL 95 (L) 06/20/2020 0949   CO2 30 06/20/2020 0949   GLUCOSE 95 06/20/2020 0949   BUN 25 (H) 06/20/2020 0949   CREATININE 0.45 06/20/2020 0949   CREATININE 0.83 01/26/2020 1623   CALCIUM 10.2 06/20/2020 0949   PROT 6.3 (L) 06/20/2020 0949   ALBUMIN 4.2 06/20/2020 0949   AST 16 06/20/2020 0949   ALT 17 06/20/2020 0949   ALKPHOS 40 06/20/2020 0949   BILITOT 0.7 06/20/2020 0949   GFRNONAA >60 06/20/2020 0949   GFRNONAA 68 01/26/2020 1623   GFRAA >60 06/20/2020 0949   GFRAA 78 01/26/2020 1623   Lab Results  Component Value Date   WBC 6.6 06/20/2020   NEUTROABS 4.9 06/20/2020   HGB 11.6 (L) 06/20/2020   HCT 35.6 (L) 06/20/2020   MCV 91.8 06/20/2020   PLT 267 06/20/2020    Imaging:  [place] Clinician Interpretation: I have personally reviewed the CNS images as listed.  My interpretation, in the context of the patient's clinical presentation, is progressive disease  MR ANGIO HEAD WO CONTRAST  Result Date: 08/05/2020 CLINICAL DATA:  Trigeminal neuralgia. Headache, cluster/trigeminal. Brain/CNS neoplasm, assess treatment response. Metastatic lung cancer. Additional history provided: SRS  to 5 brain metastases 03/17/2019, left parietal craniotomy for tumor resection 03/18/2019, SRS to 2 additional metastases 01/01/2020. EXAM: MRI HEAD WITHOUT AND WITH CONTRAST MRA HEAD WITHOUT CONTRAST TECHNIQUE: Multiplanar, multiecho pulse sequences of the brain and surrounding structures were obtained without and with intravenous contrast. Angiographic images of the head were obtained using MRA technique without contrast. CONTRAST:  23m MULTIHANCE GADOBENATE DIMEGLUMINE 529 MG/ML IV SOLN COMPARISON:  Prior brain MRI examinations 04/30/2020 and earlier. MRA head 03/04/2019. FINDINGS: MRI HEAD FINDINGS Brain: Stable generalized cerebral atrophy. Again demonstrated is a hemosiderin stained left parietooccipital resection cavity. Irregular and gyriform enhancement has increased from the prior examination, now measuring up to 12 mm in thickness (for instance as seen on series 14, image 90) (series 15, image 9). This irregular enhancement extends to the site of a previously demonstrated 3 mm nodular focus of enhancement within the superolateral aspect of the cavity. Surrounding T2/FLAIR hyperintense signal abnormality has not significantly changed. A previously demonstrated 4 mm lesion within the medial right cerebellum is no longer appreciated. A 4 mm enhancing lesion within the left cerebellar hemisphere was present on the prior examination but seen to better advantage on today's study (series 14, image 48). New 2 mm enhancing lesion more posteriorly within the medial left cerebellar hemisphere (series 14, image 50). New 2 mm enhancing lesion within the lateral left cerebellum (series 14, image 42). A 3 mm lesion within the high posterior right frontal lobe has decreased in size (series 14, image 145) (previously 5 mm). A previously demonstrated 2 mm enhancing lesion within the left frontal lobe has increased in size, now  measuring 8 mm (series 14, image 125) (series 11, image 39) . The lesion now demonstrates  central T2/FLAIR hyperintensity and T1 hyperintensity with a peripheral low signal rim. Precontrast T1 hyperintensity limits evaluation for enhancement at this site. Multifocal T2/FLAIR hyperintensity elsewhere within the cerebral white matter and within the pons is nonspecific, but compatible chronic small vessel ischemic disease. No evidence of acute infarction. No extra-axial fluid collection. No midline shift. Vascular: Expected proximal arterial flow voids. Skull and upper cervical spine: No focal suspicious marrow lesion. Sinuses/Orbits: Visualized orbits show no acute finding. Trace ethmoid sinus mucosal thickening. No significant mastoid effusion. MRA HEAD FINDINGS The intracranial internal carotid arteries are patent. The M1 middle cerebral arteries are patent without significant stenosis. No M2 proximal branch occlusion or high-grade proximal stenosis is identified. The anterior cerebral arteries are patent. 1-2 mm inferiorly projecting vascular protrusion arising from the supraclinoid right ICA which may reflect an infundibulum or small aneurysm (series 103, image 12). The intracranial vertebral arteries are patent. The basilar artery is patent. The posterior cerebral arteries are patent. IMPRESSION: MRI brain: 1. Irregular and gyriform enhancement at site of the left parietooccipital resection cavity has increased, now measuring up to 12 mm in thickness. This enhancement extends to the site of a previously demonstrated 3 mm nodular enhancing focus within the superolateral aspect of the cavity. Findings may reflect post-treatment inflammation or recurrent tumor. Short interval MRI follow-up is recommended. Surrounding T2 hyperintense signal abnormality has not appreciably changed. 2. New 2 mm metastasis within the posteromedial left cerebellar hemisphere. 3. New 2 mm metastasis within the lateral left cerebellum. 4. An additional 4 mm metastasis within the left cerebellum was present on the prior MRI of  04/30/2020, but is seen to better advantage on today's study. 5. A previously demonstrated 4 mm metastasis within the medial right cerebellum is no longer appreciated 6. 8 mm metastasis within the paramedian left frontal lobe, increased in size and now demonstrating precontrast T1 hyperintensity which limits evaluation for enhancement. 7. A 3 mm lesion within the high posterior right frontal lobe has decreased in size. MRA head: 1. No intracranial large vessel occlusion or proximal high-grade arterial stenosis. 2. 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 Electronically Signed   By: Kellie Simmering DO   On: 08/05/2020 13:49   MR BRAIN W WO CONTRAST  Result Date: 08/05/2020 CLINICAL DATA:  Trigeminal neuralgia. Headache, cluster/trigeminal. Brain/CNS neoplasm, assess treatment response. Metastatic lung cancer. Additional history provided: SRS to 5 brain metastases 03/17/2019, left parietal craniotomy for tumor resection 03/18/2019, SRS to 2 additional metastases 01/01/2020. EXAM: MRI HEAD WITHOUT AND WITH CONTRAST MRA HEAD WITHOUT CONTRAST TECHNIQUE: Multiplanar, multiecho pulse sequences of the brain and surrounding structures were obtained without and with intravenous contrast. Angiographic images of the head were obtained using MRA technique without contrast. CONTRAST:  73m MULTIHANCE GADOBENATE DIMEGLUMINE 529 MG/ML IV SOLN COMPARISON:  Prior brain MRI examinations 04/30/2020 and earlier. MRA head 03/04/2019. FINDINGS: MRI HEAD FINDINGS Brain: Stable generalized cerebral atrophy. Again demonstrated is a hemosiderin stained left parietooccipital resection cavity. Irregular and gyriform enhancement has increased from the prior examination, now measuring up to 12 mm in thickness (for instance as seen on series 14, image 90) (series 15, image 9). This irregular enhancement extends to the site of a previously demonstrated 3 mm nodular focus of enhancement within the  superolateral aspect of the cavity. Surrounding T2/FLAIR hyperintense signal abnormality has not significantly changed. A previously demonstrated 4 mm lesion  within the medial right cerebellum is no longer appreciated. A 4 mm enhancing lesion within the left cerebellar hemisphere was present on the prior examination but seen to better advantage on today's study (series 14, image 48). New 2 mm enhancing lesion more posteriorly within the medial left cerebellar hemisphere (series 14, image 50). New 2 mm enhancing lesion within the lateral left cerebellum (series 14, image 42). A 3 mm lesion within the high posterior right frontal lobe has decreased in size (series 14, image 145) (previously 5 mm). A previously demonstrated 2 mm enhancing lesion within the left frontal lobe has increased in size, now measuring 8 mm (series 14, image 125) (series 11, image 39) . The lesion now demonstrates central T2/FLAIR hyperintensity and T1 hyperintensity with a peripheral low signal rim. Precontrast T1 hyperintensity limits evaluation for enhancement at this site. Multifocal T2/FLAIR hyperintensity elsewhere within the cerebral white matter and within the pons is nonspecific, but compatible chronic small vessel ischemic disease. No evidence of acute infarction. No extra-axial fluid collection. No midline shift. Vascular: Expected proximal arterial flow voids. Skull and upper cervical spine: No focal suspicious marrow lesion. Sinuses/Orbits: Visualized orbits show no acute finding. Trace ethmoid sinus mucosal thickening. No significant mastoid effusion. MRA HEAD FINDINGS The intracranial internal carotid arteries are patent. The M1 middle cerebral arteries are patent without significant stenosis. No M2 proximal branch occlusion or high-grade proximal stenosis is identified. The anterior cerebral arteries are patent. 1-2 mm inferiorly projecting vascular protrusion arising from the supraclinoid right ICA which may reflect an  infundibulum or small aneurysm (series 103, image 12). The intracranial vertebral arteries are patent. The basilar artery is patent. The posterior cerebral arteries are patent. IMPRESSION: MRI brain: 1. Irregular and gyriform enhancement at site of the left parietooccipital resection cavity has increased, now measuring up to 12 mm in thickness. This enhancement extends to the site of a previously demonstrated 3 mm nodular enhancing focus within the superolateral aspect of the cavity. Findings may reflect post-treatment inflammation or recurrent tumor. Short interval MRI follow-up is recommended. Surrounding T2 hyperintense signal abnormality has not appreciably changed. 2. New 2 mm metastasis within the posteromedial left cerebellar hemisphere. 3. New 2 mm metastasis within the lateral left cerebellum. 4. An additional 4 mm metastasis within the left cerebellum was present on the prior MRI of 04/30/2020, but is seen to better advantage on today's study. 5. A previously demonstrated 4 mm metastasis within the medial right cerebellum is no longer appreciated 6. 8 mm metastasis within the paramedian left frontal lobe, increased in size and now demonstrating precontrast T1 hyperintensity which limits evaluation for enhancement. 7. A 3 mm lesion within the high posterior right frontal lobe has decreased in size. MRA head: 1. No intracranial large vessel occlusion or proximal high-grade arterial stenosis. 2. 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  MRI demonstrates two additional sub-cm foci of enhancement representing new metastatic burden.    Case will be reviewed in brain/spine tumor board.  There is concern regarding continuing to treat these lesions with salvage SRS in the absence of any systemic therapy.     Per Dr. [name] most recent note and EGFR status, patient is candidate for Osimertinib therapy.  This agent can potentially provide local control of CNS metastatic burden.  Patient would prefer not  to utilize further radiosurgery unless necessary.    We will reach out to Dr. [name] and inquire regarding patient candidacy for Tagrisso.  If amenable, would recommend repeating MRI brain in 2 months for careful monitoring of the new lesions.   Otherwise we will refer back to radiation oncology and recommend follow up MRI brain in 3 months following SRS.  We appreciate the opportunity to participate in the care of [place].   All questions were answered. The patient knows to call the clinic with any problems, questions or concerns. No barriers to learning were detected.  I have spent a total of 40 minutes of face-to-face and non-face-to-face time, excluding clinical staff time, preparing to see patient, ordering tests and/or medications, counseling the patient, and independently interpreting results and communicating results to the patient/family/caregiver   Ventura Sellers, MD Medical Director of Neuro-Oncology [facility] [place] 08/09/20 11:49 AM

## 2020-08-16 ENCOUNTER — Telehealth: Payer: Self-pay | Admitting: *Deleted

## 2020-08-16 NOTE — Telephone Encounter (Signed)
Per Dr. Renda Rolls last office visit he would need to talk to Dr. [name] Medical Oncologist to review proposed treatment options.    Patient called today to see if that discussion had happened and what the decision was.  She wants to start whatever treatment it is.  She is also wanting to travel to visit her family for the upcoming Thanksgiving holiday and wasn't sure what was going to happen on our end that impact her traveling.  Routed to both MD's to discuss and decision be relayed to patient.

## 2020-08-22 ENCOUNTER — Telehealth: Payer: Self-pay | Admitting: Hematology and Oncology

## 2020-08-22 NOTE — Telephone Encounter (Signed)
Scheduled appt per 11/15 sch msg - pt is aware of appt date and time

## 2020-08-23 ENCOUNTER — Telehealth (INDEPENDENT_AMBULATORY_CARE_PROVIDER_SITE_OTHER): Payer: Self-pay

## 2020-08-23 ENCOUNTER — Other Ambulatory Visit: Payer: Self-pay | Admitting: Family Medicine

## 2020-08-23 ENCOUNTER — Telehealth: Payer: Self-pay

## 2020-08-23 ENCOUNTER — Ambulatory Visit: Payer: PPO | Admitting: Family Medicine

## 2020-08-23 ENCOUNTER — Telehealth: Payer: Self-pay | Admitting: Family Medicine

## 2020-08-23 MED ORDER — CEPHALEXIN 500 MG PO CAPS: 500.0000 mg | ORAL_CAPSULE | Freq: Three times a day (TID) | ORAL | 0 refills | Status: DC

## 2020-08-23 NOTE — Telephone Encounter (Signed)
Patient called and stated that she has a UTI and has reached out to her PCP Dr. Dennard Schaumann. Patient thanked [place] for calling and will await to hear from Dr. Dennard Schaumann.

## 2020-08-23 NOTE — Telephone Encounter (Signed)
I sent in keflex.

## 2020-08-23 NOTE — Telephone Encounter (Signed)
CB# [phone] Pt has a UTI burning when she's urinates would Dr.Pickard prescribe medication

## 2020-08-23 NOTE — Telephone Encounter (Signed)
Pt asked about being admitted in to the [facility], she stated she's just becoming weary with her cancer returning. She isn't able to clean and get around she's having to ask other's to do things for her.

## 2020-08-24 NOTE — Telephone Encounter (Signed)
Pt called and made aware abx sent in to pharmacy

## 2020-08-26 ENCOUNTER — Telehealth: Payer: Self-pay | Admitting: Family Medicine

## 2020-08-26 NOTE — Telephone Encounter (Signed)
Spoke with pt regarding some bumps on her lip and I told her that Dr. Dennard Schaumann will be out of the office all next week so she stated that she has an appt with the cancer  dr Monday 08/29/20 and she will cb if she needs to be seen.

## 2020-08-26 NOTE — Telephone Encounter (Signed)
?    Has spot on her lip ? Thrush  offer to schedule pt would like to speak with the nurse

## 2020-08-29 ENCOUNTER — Telehealth: Payer: Self-pay | Admitting: Pharmacist

## 2020-08-29 ENCOUNTER — Inpatient Hospital Stay (HOSPITAL_BASED_OUTPATIENT_CLINIC_OR_DEPARTMENT_OTHER): Payer: PPO | Admitting: Hematology and Oncology

## 2020-08-29 ENCOUNTER — Other Ambulatory Visit: Payer: Self-pay

## 2020-08-29 ENCOUNTER — Inpatient Hospital Stay: Payer: PPO

## 2020-08-29 ENCOUNTER — Other Ambulatory Visit: Payer: Self-pay | Admitting: *Deleted

## 2020-08-29 ENCOUNTER — Other Ambulatory Visit: Payer: Self-pay | Admitting: Hematology and Oncology

## 2020-08-29 VITALS — BP 108/78 | HR 80 | Temp 98.6°F | Resp 16 | Ht 60.0 in | Wt 114.9 lb

## 2020-08-29 DIAGNOSIS — C7931 Secondary malignant neoplasm of brain: Secondary | ICD-10-CM

## 2020-08-29 DIAGNOSIS — C3492 Malignant neoplasm of unspecified part of left bronchus or lung: Secondary | ICD-10-CM | POA: Diagnosis not present

## 2020-08-29 DIAGNOSIS — C3432 Malignant neoplasm of lower lobe, left bronchus or lung: Secondary | ICD-10-CM | POA: Diagnosis not present

## 2020-08-29 LAB — CBC WITH DIFFERENTIAL (CANCER CENTER ONLY)
Abs Immature Granulocytes: 0.02 10*3/uL (ref 0.00–0.07)
Basophils Absolute: 0.1 10*3/uL (ref 0.0–0.1)
Basophils Relative: 1 %
Eosinophils Absolute: 0.1 10*3/uL (ref 0.0–0.5)
Eosinophils Relative: 2 %
HCT: 37.9 % (ref 36.0–46.0)
Hemoglobin: 12.8 g/dL (ref 12.0–15.0)
Immature Granulocytes: 0 %
Lymphocytes Relative: 12 %
Lymphs Abs: 0.8 10*3/uL (ref 0.7–4.0)
MCH: 30.1 pg (ref 26.0–34.0)
MCHC: 33.8 g/dL (ref 30.0–36.0)
MCV: 89.2 fL (ref 80.0–100.0)
Monocytes Absolute: 0.5 10*3/uL (ref 0.1–1.0)
Monocytes Relative: 7 %
Neutro Abs: 4.8 10*3/uL (ref 1.7–7.7)
Neutrophils Relative %: 78 %
Platelet Count: 308 10*3/uL (ref 150–400)
RBC: 4.25 MIL/uL (ref 3.87–5.11)
RDW: 11.9 % (ref 11.5–15.5)
WBC Count: 6.3 10*3/uL (ref 4.0–10.5)
nRBC: 0 % (ref 0.0–0.2)

## 2020-08-29 LAB — CMP (CANCER CENTER ONLY)
ALT: 16 U/L (ref 0–44)
AST: 16 U/L (ref 15–41)
Albumin: 3.9 g/dL (ref 3.5–5.0)
Alkaline Phosphatase: 56 U/L (ref 38–126)
Anion gap: 6 (ref 5–15)
BUN: 18 mg/dL (ref 8–23)
CO2: 28 mmol/L (ref 22–32)
Calcium: 9.8 mg/dL (ref 8.9–10.3)
Chloride: 99 mmol/L (ref 98–111)
Creatinine: 0.6 mg/dL (ref 0.44–1.00)
GFR, Estimated: >60 mL/min (ref >60)
Glucose, Bld: 101 mg/dL — ABNORMAL HIGH (ref 70–99)
Potassium: 4.5 mmol/L (ref 3.5–5.1)
Sodium: 133 mmol/L — ABNORMAL LOW (ref 135–145)
Total Bilirubin: 0.3 mg/dL (ref 0.3–1.2)
Total Protein: 6.7 g/dL (ref 6.5–8.1)

## 2020-08-29 MED ORDER — OSIMERTINIB MESYLATE 80 MG PO TABS: 80.0000 mg | ORAL_TABLET | Freq: Every day | ORAL | 2 refills | Status: DC

## 2020-08-29 MED ORDER — PREDNISONE 50 MG PO TABS: ORAL_TABLET | ORAL | 0 refills | Status: DC

## 2020-08-29 NOTE — Telephone Encounter (Signed)
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  Prescription dose and frequency assessed for appropriateness. Appropriate for therapy initiation.   CBC w/ Diff and CMP from 08/29/20 assessed, labs stable for treatment initiation.  Current medication list in Epic reviewed, DDIs with Tagrisso identified:  Category C DDI between [place] and Citalopram as well as Tagrisso and Ondansetron for risk of QTc prolongation. Recommend continued monitoring and correction of any electrolyte abnormalities while on therapy and EKG as clinically indicated per MD discretion.   Evaluated chart and no patient barriers to medication adherence noted.   Prescription has been e-scribed to the [facility] for benefits analysis and approval.  Oral Oncology Clinic will continue to follow for insurance authorization, copayment issues, initial counseling and start date.  Leron Croak, PharmD, BCPS Hematology/Oncology Clinical Pharmacist [facility] [phone] 08/29/2020 1:39 PM

## 2020-08-31 MED FILL — TAGRISSO 80 MG TABLET: 80 | 30 days supply | Qty: 30 | Fill #0

## 2020-08-31 NOTE — Telephone Encounter (Signed)
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   Counseled patient on administration, dosing, side effects, monitoring, drug-food interactions, safe handling, storage, and disposal.  Patient will take Tagrisso 80 tablets, 1 tablet by mouth once daily, without regard to food.  Tagrisso start date: 09/05/20  Adverse effects include but are not limited to: diarrhea, mouth sores, decreased appetitie, fatigue, dry skin, rash, nail changes, altered cardiac conduction, and decreased blood counts or electrolytes.   Patient will obtain anti diarrheal and alert the office of 4 or more loose stools above baseline.   Reviewed with patient importance of keeping a medication schedule and plan for any missed doses. No barriers to medication adherence identified.  Medication reconciliation performed and medication/allergy list updated.  Insurance authorization for [place] has been obtained. Test claim at the pharmacy revealed copayment $9 for 1st fill of Tagrisso. This will ship from the [facility] on 08/31/20 to deliver to patient's home on 09/02/20.   Patient informed the pharmacy will reach out 5-7 days prior to needing next fill of Tagrisso to coordinate continued medication acquisition to prevent break in therapy.  All questions answered.  Ms. Mirza voiced understanding and appreciation.   Medication education handout placed in mail for patient. Patient knows to call the office with questions or concerns. Oral Chemotherapy Clinic phone number provided to patient.   Leron Croak, PharmD, BCPS Hematology/Oncology Clinical Pharmacist [facility] [phone] 08/31/2020 9:47 AM

## 2020-09-01 ENCOUNTER — Other Ambulatory Visit: Payer: Self-pay | Admitting: Family Medicine

## 2020-09-01 DIAGNOSIS — R0902 Hypoxemia: Secondary | ICD-10-CM | POA: Diagnosis not present

## 2020-09-01 DIAGNOSIS — C3490 Malignant neoplasm of unspecified part of unspecified bronchus or lung: Secondary | ICD-10-CM | POA: Diagnosis not present

## 2020-09-01 DIAGNOSIS — J9621 Acute and chronic respiratory failure with hypoxia: Secondary | ICD-10-CM | POA: Diagnosis not present

## 2020-09-01 DIAGNOSIS — J449 Chronic obstructive pulmonary disease, unspecified: Secondary | ICD-10-CM | POA: Diagnosis not present

## 2020-09-04 ENCOUNTER — Encounter: Payer: Self-pay | Admitting: Hematology and Oncology

## 2020-09-04 NOTE — Progress Notes (Signed)
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 0/14 lymph nodes.  No perineural invasion or LVI.  negative margins 2) 03/03/2019: presented to ED with expressive aphasia, found to have multiple brain metastasis and underwent SRS on 03/17/2019. 3) 03/18/2019: Stereotactic left parietal craniotomy for resection of tumor 4) 04/30/2020: Brain MRI on showed 2 new subcentimeter meta stasis in the right cerebellum and right frontal lobe. 5)  05/20/2020: Stereotactic radiation therapy was completed 6) 06/23/2020: establish care with Dr. [name]  7) 08/29/2020: prescribed osimirtinib therapy due to next metastatic disease in the brain.   Interval History:  Chairty S Sanford 79 y.o. female with medical history significant for metastatic adenocarcinoma of the lung with brain metastasis who presents for a follow up visit. The patient's last visit was on 06/23/2020 at which time she established care. In the interim since the last visit the patient had an MRI brain which shows new brain metastatic lesions.   On exam today [name] notes that she is at her baseline level of health at this time.  She denies any headache or vision changes.  She notes that she is currently getting over a urinary tract infection which was treated with Keflex therapy.  She also notes that she had an ulcer on her lip which is subsequently healing.  She reaffirmed today that she  would like to continue her care here at the [facility] cancer center with myself.  She has other visit scheduled with Dr. Delton Coombes and would like to have those discontinued.  She denies having issues with fevers, chills, sweats, nausea, vomiting or diarrhea.  A full 10 point ROS is listed below.  The bulk of our discussion focused on therapies for her metastatic adenocarcinoma of the lung.  The therapy I presented at this time is osimertinib.  The full details of this discussion are listed below.  MEDICAL HISTORY:  Past Medical History:  Diagnosis Date  . Adenocarcinoma of lung ([name]) 12/2010   left lung/surg only  . Allergic rhinitis   . 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 Anxiety disorder   . Aortic aneurysm ([place])   . Brain metastasis ([place])    lung cancer s/p resection 7/20  . Cancer ([place])    Phreesia 05/26/2020  . Complication of anesthesia 2011   bloodpressure dropped during colonoscopy, not problems since  . Compression fracture   . COPD (chronic obstructive pulmonary disease) ([place])   . Depression   . Diastolic dysfunction   . Diverticulosis   . Dysphagia   . Emphysema lung ([place]) 11/08/2014  . Headache   . Hiatal hernia   . History of kidney stones   . Hypothyroidism   . IBS (irritable bowel syndrome)   . Iron deficiency anemia due to chronic blood loss 12/05/2016  . On home O2 12/15/2013   chronic hypoxia  . Pneumonia   . PUD (peptic ulcer disease)    79yr  . Shortness of breath    with exertion  . SIADH (syndrome  of inappropriate ADH production) ([place])   . Trigeminal neuralgia     SURGICAL HISTORY: Past Surgical History:  Procedure Laterality Date  . ABDOMINAL HYSTERECTOMY    . APPLICATION OF CRANIAL NAVIGATION N/A 03/18/2019   Procedure: APPLICATION OF CRANIAL NAVIGATION;  Surgeon: Consuella Lose, MD;  Location: [place];  Service: Neurosurgery;  Laterality: N/A;  . BACK SURGERY  January 13, 2014  . BRAIN SURGERY N/A    Phreesia  03/28/2020  . CATARACT EXTRACTION    . CATARACT EXTRACTION W/PHACO  09/02/2012   Procedure: CATARACT EXTRACTION PHACO AND INTRAOCULAR LENS PLACEMENT (IOC);  Surgeon: Elta Guadeloupe T. Gershon Crane, MD;  Location: [facility];  Service: Ophthalmology;  Laterality: Left;  CDE:10.35  . CHOLECYSTECTOMY    . 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 COLONOSCOPY N/A 11/23/2014   Dr. Oneida Alar: moderate diverticulosis, hemorrhoids, redundant colon. next TCS in 10-15 years.   . CRANIOTOMY Left 03/18/2019   Procedure: Left stereotactic craniotomy for tumor resection;  Surgeon: Consuella Lose, MD;  Location: [place];  Service: Neurosurgery;  Laterality: Left;  Left stereotactic craniotomy for tumor resection  . ENDARTERECTOMY Right 08/21/2013   Procedure: RIGHT CAROTID ANEURYSM RESECTION;  Surgeon: Rosetta Posner, MD;  Location: [place];  Service: Vascular;  Laterality: Right;  . ESOPHAGEAL DILATION  02/24/2018   Procedure: ESOPHAGEAL DILATION;  Surgeon: Danie Binder, MD;  [facility];  Service: Endoscopy;;  . ESOPHAGOGASTRODUODENOSCOPY  11/13/2009   w/dilation to 48m, gastric ulceration (H.Pylori) s/p treatment  . ESOPHAGOGASTRODUODENOSCOPY  11/21/2009   distal esophageal web, gastritis  . ESOPHAGOGASTRODUODENOSCOPY  03/14/12   SDDU:KGURKYHCWin the distal esophagus/Mild gastritis/small HH. + H.pylori, prescribed Pylera. Finished treatment.   . ESOPHAGOGASTRODUODENOSCOPY N/A 12/13/2017   Dr. FOneida Alar esophageal web s/p dilation, gastritis, no h.pylori  . ESOPHAGOGASTRODUODENOSCOPY N/A 02/24/2018   Dr. FOneida Alar web in prox esophagus and in distal esophagus s/p dilation, mild gastritis, mild pyloric stenosis, mild post ulcer duodenal deformity at D1/D2  . fatty tumor removal from lt groin    . FRACTURE SURGERY Left    hand and left ring finger  . GIVENS CAPSULE STUDY N/A 12/26/2017   incomplete  . GIVENS CAPSULE STUDY N/A 02/24/2018   normal small bowel  . KNEE SURGERY     patella  tendon repair june 2019 [place]  . LUNG CANCER SURGERY  12/2010   Left VATS, minithoracotomy, LLL superior segmentectomy  . ORIF PATELLA Right 03/18/2018   Procedure: OPEN REDUCTION INTERNAL (ORIF) FIXATION RIGHT PATELLA;  Surgeon: HCarole Civil MD;  Location[facility];  Service: Orthopedics;  Laterality: Right;  . ORIF WRIST FRACTURE Left 04/09/2014   Procedure: OPEN REDUCTION INTERNAL FIXATION (ORIF) WRIST FRACTURE;  Surgeon: TRenette Butters MD;  [place]  Service: Orthopedics;  Laterality: Left;  . PERCUTANEOUS PINNING Left 04/09/2014   Procedure: PERCUTANEOUS PINNING EXTREMITY;  Surgeon: TRenette Butters MD;  [place]  Service: Orthopedics;  Laterality: Left;  . SAVORY DILATION N/A 12/13/2017   Procedure: SAVORY DILATION;  Surgeon: FDanie Binder MD;  [facility];  Service: Endoscopy;  Laterality: N/A;  . TUBAL LIGATION    . vocal cord surgery  02/06/2011   laryngoscopy with bilateral vocal cord Radiesse injection for vocal cord paralysis  . YAG LASER APPLICATION Left 123/76/2831  Procedure: YAG LASER APPLICATION;  Surgeon: MRutherford Guys MD;  [facility];  Service: Ophthalmology;  Laterality: Left;    SOCIAL HISTORY: Social History  Socioeconomic History  . Marital status: Divorced    Spouse name: Not on file  . Number of children: Not on file  . Years of education: Not on file  . Highest education level: Not on file  Occupational History  . Not on file  Tobacco Use  . Smoking status: Former Smoker    Packs/day: 0.50    Years: 20.00    Pack years: 10.00    Types: Cigarettes    Quit date: 12/21/2010    Years since quitting: 9.7  . Smokeless tobacco: Never Used  . Tobacco comment: smoking cessation info given and reviewed   Vaping Use  . Vaping Use: Never used  Substance and Sexual Activity  . Alcohol use: Not Currently    Alcohol/week: 0.0 standard drinks  . Drug use: No  . Sexual activity: Yes    Birth control/protection: Surgical   Other Topics Concern  . Not on file  Social History Narrative   Divorced since 14.Lives alone with a dog.Marland KitchenRetired.  She receives Meals on Wheels.   Social Determinants of Health   Financial Resource Strain: Low Risk   . Difficulty of Paying Living Expenses: Not hard at all  Food Insecurity:   . Worried About Charity fundraiser in the Last Year: Not on file  . Ran Out of Food in the Last Year: Not on file  Transportation Needs:   . Lack of Transportation (Medical): Not on file  . Lack of Transportation (Non-Medical): Not on file  Physical Activity:   . Days of Exercise per Week: Not on file  . Minutes of Exercise per Session: Not on file  Stress:   . Feeling of Stress : Not on file  Social Connections:   . Frequency of Communication with Friends and Family: Not on file  . Frequency of Social Gatherings with Friends and Family: Not on file  . Attends Religious Services: Not on file  . Active Member of Clubs or Organizations: Not on file  . Attends Archivist Meetings: Not on file  . Marital Status: Not on file  Intimate Partner Violence:   . Fear of Current or Ex-Partner: Not on file  . Emotionally Abused: Not on file  . Physically Abused: Not on file  . Sexually Abused: Not on file    FAMILY HISTORY: Family History  Problem Relation Age of Onset  . Pulmonary embolism Mother   . Heart attack Father   . Hypertension Father   . Liver cancer Sister   . Cancer Sister   . Cancer Brother   . Cancer Daughter   . Breast cancer Daughter   . Colon cancer Neg Hx     ALLERGIES:  is allergic to ciprofloxacin hcl, sulfonamide derivatives, iohexol, iodinated diagnostic agents, and prozac [fluoxetine hcl].  MEDICATIONS:  Current Outpatient Medications  Medication Sig Dispense Refill  . albuterol (VENTOLIN HFA) 108 (90 Base) MCG/ACT inhaler Inhale 2 puffs into the lungs every 6 (six) hours as needed for wheezing or shortness of breath. 18 g 2  . ALPRAZolam (XANAX)  0.5 MG tablet TAKE (1) TABLET BY MOUTH TWICE A DAY AS NEEDED. 60 tablet 3  . cephALEXin (KEFLEX) 500 MG capsule Take 1 capsule (500 mg total) by mouth 3 (three) times daily. 15 capsule 0  . citalopram (CELEXA) 40 MG tablet TAKE (1/2) TABLET BY MOUTH AT BEDTIME. 45 tablet 5  . denosumab (PROLIA) 60 MG/ML SOSY injection Inject 60 mg into the skin every 6 (six) months. 1  mL 0  . diphenhydrAMINE (BENADRYL) 50 MG tablet Take 1 tablet (50 mg total) by mouth once for 1 dose. Take one hour prior to CT scan 1 tablet 0  . ferrous sulfate 325 (65 FE) MG tablet Take 325 mg by mouth daily with breakfast.    . fluticasone (FLONASE) 50 MCG/ACT nasal spray INSTILL 2 SPRAYS INTO BOTH NOSTRILS DAILY. 16 g 0  . gabapentin (NEURONTIN) 100 MG capsule Take 1 capsule (100 mg total) by mouth 3 (three) times daily. 90 capsule 3  . levocetirizine (XYZAL) 5 MG tablet TAKE ONE TABLET BY MOUTH ONCE DAILY. 30 tablet 0  . levothyroxine (SYNTHROID) 50 MCG tablet TAKE 1 TABLET BEFORE BREAKFAST. 90 tablet 0  . mometasone (ELOCON) 0.1 % cream Apply 1 application topically daily. 45 g 0  . Multiple Vitamin (MULTIVITAMIN WITH MINERALS) TABS tablet Take 1 tablet by mouth daily.    . ondansetron (ZOFRAN) 4 MG tablet TAKE 1 TABLET BY MOUTH 3 TIMES DAILY AS NEEDED FOR NAUSEA AND VOMITING. 90 tablet 0  . osimertinib mesylate (TAGRISSO) 80 MG tablet Take 1 tablet (80 mg total) by mouth daily. 30 tablet 2  . OVER THE COUNTER MEDICATION Bone Support 2-4 tabs daily    . OXcarbazepine (TRILEPTAL) 150 MG tablet TAKE (1) TABLET BY MOUTH TWICE DAILY. 60 tablet 2  . pantoprazole (PROTONIX) 40 MG tablet TAKE (1) TABLET BY MOUTH ONCE DAILY BEFORE BREAKFAST. 90 tablet 3  . predniSONE (DELTASONE) 50 MG tablet Please take one tablet at 13 hours, 7 hours and one hour prior to CT scan 3 tablet 0  . Probiotic Product (ALIGN PO) Take by mouth daily.    . TRELEGY ELLIPTA 100-62.5-25 MCG/INH AEPB INHALE 1 PUFF INTO LUNGS DAILY. 60 each 2   No current  facility-administered medications for this visit.    REVIEW OF SYSTEMS:   Constitutional: ( - ) fevers, ( - )  chills , ( - ) night sweats Eyes: ( - ) blurriness of vision, ( - ) double vision, ( - ) watery eyes Ears, nose, mouth, throat, and face: ( - ) mucositis, ( - ) sore throat Respiratory: ( - ) cough, ( - ) dyspnea, ( - ) wheezes Cardiovascular: ( - ) palpitation, ( - ) chest discomfort, ( - ) lower extremity swelling Gastrointestinal:  ( - ) nausea, ( - ) heartburn, ( - ) change in bowel habits Skin: ( - ) abnormal skin rashes Lymphatics: ( - ) new lymphadenopathy, ( - ) easy bruising Neurological: ( - ) numbness, ( - ) tingling, ( - ) new weaknesses Behavioral/Psych: ( - ) mood change, ( - ) new changes  All other systems were reviewed with the patient and are negative.  PHYSICAL EXAMINATION: ECOG PERFORMANCE STATUS: 3 - Symptomatic, >50% confined to bed  Vitals:   08/29/20 1006  BP: 108/78  Pulse: 80  Resp: 16  Temp: 98.6 F (37 C)  SpO2: 93%   Filed Weights   08/29/20 1006  Weight: 114 lb 14.4 oz (52.1 kg)    GENERAL: chronically ill appearing elderly Caucasian female. Alert, no distress and comfortable SKIN: skin color, texture, turgor are normal, no rashes or significant lesions EYES: conjunctiva are pink and non-injected, sclera clear LUNGS: clear to auscultation and percussion with normal breathing effort HEART: regular rate & rhythm and no murmurs and no lower extremity edema ABDOMEN: soft, non-tender, non-distended, normal bowel sounds Musculoskeletal: no cyanosis of digits and no clubbing  PSYCH: alert & oriented x  3, fluent speech NEURO: no focal motor/sensory deficits  LABORATORY DATA:  I have reviewed the data as listed CBC Latest Ref Rng & Units 08/29/2020 06/20/2020 03/30/2020  WBC 4.0 - 10.5 K/uL 6.3 6.6 9.5  Hemoglobin 12.0 - 15.0 g/dL 12.8 11.6(L) 12.8  Hematocrit 36 - 46 % 37.9 35.6(L) 39.2  Platelets 150 - 400 K/uL 308 267 370    CMP  Latest Ref Rng & Units 08/29/2020 06/20/2020 05/11/2020  Glucose 70 - 99 mg/dL 101(H) 95 -  BUN 8 - 23 mg/dL 18 25(H) 19  Creatinine 0.44 - 1.00 mg/dL 0.60 0.45 0.46  Sodium 135 - 145 mmol/L 133(L) 132(L) -  Potassium 3.5 - 5.1 mmol/L 4.5 4.6 -  Chloride 98 - 111 mmol/L 99 95(L) -  CO2 22 - 32 mmol/L 28 30 -  Calcium 8.9 - 10.3 mg/dL 9.8 10.2 -  Total Protein 6.5 - 8.1 g/dL 6.7 6.3(L) -  Total Bilirubin 0.3 - 1.2 mg/dL 0.3 0.7 -  Alkaline Phos 38 - 126 U/L 56 40 -  AST 15 - 41 U/L 16 16 -  ALT 0 - 44 U/L 16 17 -    RADIOGRAPHIC STUDIES:  MRI Brain 08/05/2020:   CLINICAL DATA:  Trigeminal neuralgia. Headache, cluster/trigeminal. Brain/CNS neoplasm, assess treatment response. Metastatic lung cancer. Additional history provided: SRS to 5 brain metastases 03/17/2019, left parietal craniotomy for tumor resection 03/18/2019, SRS to 2 additional metastases 01/01/2020.  EXAM: MRI HEAD WITHOUT AND WITH CONTRAST  MRA HEAD WITHOUT CONTRAST  TECHNIQUE: Multiplanar, multiecho pulse sequences of the brain and surrounding structures were obtained without and with intravenous contrast. Angiographic images of the head were obtained using MRA technique without contrast.  CONTRAST:  15m MULTIHANCE GADOBENATE DIMEGLUMINE 529 MG/ML IV SOLN  COMPARISON:  Prior brain MRI examinations 04/30/2020 and earlier. MRA head 03/04/2019.  FINDINGS: MRI HEAD FINDINGS  Brain:  Stable generalized cerebral atrophy.  Again demonstrated is a hemosiderin stained left parietooccipital resection cavity. Irregular and gyriform enhancement has increased from the prior examination, now measuring up to 12 mm in thickness (for instance as seen on series 14, image 90) (series 15, image 9). This irregular enhancement extends to the site of a previously demonstrated 3 mm nodular focus of enhancement within the superolateral aspect of the cavity. Surrounding T2/FLAIR hyperintense signal abnormality has  not significantly changed.  A previously demonstrated 4 mm lesion within the medial right cerebellum is no longer appreciated.  A 4 mm enhancing lesion within the left cerebellar hemisphere was present on the prior examination but seen to better advantage on today's study (series 14, image 48).  New 2 mm enhancing lesion more posteriorly within the medial left cerebellar hemisphere (series 14, image 50).  New 2 mm enhancing lesion within the lateral left cerebellum (series 14, image 42).  A 3 mm lesion within the high posterior right frontal lobe has decreased in size (series 14, image 145) (previously 5 mm).  A previously demonstrated 2 mm enhancing lesion within the left frontal lobe has increased in size, now measuring 8 mm (series 14, image 125) (series 11, image 39) . The lesion now demonstrates central T2/FLAIR hyperintensity and T1 hyperintensity with a peripheral low signal rim. Precontrast T1 hyperintensity limits evaluation for enhancement at this site.  Multifocal T2/FLAIR hyperintensity elsewhere within the cerebral white matter and within the pons is nonspecific, but compatible chronic small vessel ischemic disease.  No evidence of acute infarction.  No extra-axial fluid collection.  No midline shift.  Vascular: Expected proximal  arterial flow voids.  Skull and upper cervical spine: No focal suspicious marrow lesion.  Sinuses/Orbits: Visualized orbits show no acute finding. Trace ethmoid sinus mucosal thickening. No significant mastoid effusion.  MRA HEAD FINDINGS  The intracranial internal carotid arteries are patent. The M1 middle cerebral arteries are patent without significant stenosis. No M2 proximal branch occlusion or high-grade proximal stenosis is identified. The anterior cerebral arteries are patent. 1-2 mm inferiorly projecting vascular protrusion arising from the supraclinoid right ICA which may reflect an infundibulum or  small aneurysm (series 103, image 12).  The intracranial vertebral arteries are patent. The basilar artery is patent. The posterior cerebral arteries are patent.  IMPRESSION: MRI brain:  1. Irregular and gyriform enhancement at site of the left parietooccipital resection cavity has increased, now measuring up to 12 mm in thickness. This enhancement extends to the site of a previously demonstrated 3 mm nodular enhancing focus within the superolateral aspect of the cavity. Findings may reflect post-treatment inflammation or recurrent tumor. Short interval MRI follow-up is recommended. Surrounding T2 hyperintense signal abnormality has not appreciably changed. 2. New 2 mm metastasis within the posteromedial left cerebellar hemisphere. 3. New 2 mm metastasis within the lateral left cerebellum. 4. An additional 4 mm metastasis within the left cerebellum was present on the prior MRI of 04/30/2020, but is seen to better advantage on today's study. 5. A previously demonstrated 4 mm metastasis within the medial right cerebellum is no longer appreciated 6. 8 mm metastasis within the paramedian left frontal lobe, increased in size and now demonstrating precontrast T1 hyperintensity which limits evaluation for enhancement. 7. A 3 mm lesion within the high posterior right frontal lobe has decreased in size.  MRA head:  1. No intracranial large vessel occlusion or proximal high-grade arterial stenosis. 2. 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   Electronically Signed   By: Kellie Simmering DO   On: 08/05/2020 13:49  No results found.  ASSESSMENT & PLAN [name] 79 y.o. female with medical history significant for metastatic adenocarcinoma of the lung with brain metastasis who presents for a follow up visit.  After review the records, review of the labs, review the imaging, discussion with the patient the findings are most consistent  with a stage IV adenocarcinoma of the lung with a small lung lesion and metastatic spread to the brain.  Her metastatic brain mets have been treated with resection of tumor on 03/18/2019 as well as SRS on 12/12/2019.  Most recently she was found to have 2 new subcentimeter lesions and underwent stereotactic radiation therapy which was completed on 05/20/2020. Unfortunately on 08/05/2020 she was noted to have new CNS metastatic disease.   There are limitations in what therapies can be used for this patient given her poor functional status.  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  After extensive discussions with the patient's about the risks and benefits of this treatment she was agreeable to starting osimertinib 80 mg p.o. daily for her metastatic adenocarcinoma of the lung.  We will have her back in approximately 4 weeks time in order to assure that she is tolerating therapy well.  #Stage IV ([id number]) adenocarcinoma of the lung: -Left lower lobe resection on 12/21/2010, 14 lymph nodes negative, 2.2 cm tumor size, margins negative. -most recent CNS scan shows continued new metastatic disease, with several sub centimeter lesions reported on last scan.  --will start therapy with  osimertinib 80 mg PO daily for her EGFR Exon 19 mutation. She is to start as soon as she acquires the medication.  -CT chest with contrast on 03/10/2020 showed interval progression of left lower lobe lung nodules measuring up to 7 mm. Repeat CT scan on 06/22/2020 shows stable left lower lobe pulmonary nodules.  Tree-in-bud opacities in the right upper lobe along the minor fissure may represent infectious/inflammatory process. Continue CT scans q 3 months.  -PD-L1 testing was 1%. -- Patient is a poor candidate for systemic therapy, though she does have an EGFR Exon 19 mutation.  --RTC in 4 weeks time to assure tolerance of osimirtenib.   #Brain metastasis, progressing. -Presentation  with expressive aphasia to ER on 03/03/2019 along with right-sided weakness. Found to have multiple brain metastasis and underwent SRS on 03/17/2019. 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 -Underwent SRS on 01/01/2020 for 2 subcentimeter brain lesions. -Brain MRI on 04/30/2020 showed 2 new subcentimeter meta stasis in the right cerebellum and right frontal lobe. -Stereotactic radiation therapy was completed on 05/20/2020 --most recent MRI on 08/05/2020 showed progression on disease. Starting systemic therapy as above.   No orders of the defined types were placed in this encounter.   All questions were answered. The patient knows to call the clinic with any problems, questions or concerns.  A total of more than 30 minutes were spent on this encounter and over half of that time was spent on counseling and coordination of care as outlined above.   Ledell Peoples, MD Department of Hematology/Oncology [place] [facility] Phone: [phone] Pager: [phone] Email: [email or web address]  09/04/2020 7:24 PM    Suszanne Conners HA, Su JM, Vader SH, Rowe Pavy, [place] MJ. 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 J Thorac Oncol. 2020 Nov;15(11):1758-1766. doi: 10.1016/j.jtho.2020.06.018. Epub 2020 Jul 9. PMID: [id number].  --Osimertinib is a promising treatment option for EGFR-mutated NSCLC with LM regardless of T790M mutational status.

## 2020-09-05 ENCOUNTER — Telehealth: Payer: Self-pay | Admitting: Family Medicine

## 2020-09-05 NOTE — Telephone Encounter (Signed)
[phone] Pt has concerns about her  Sodium level wasn't sure if Dr.Pickard would like for her take over the counter medication for her Sodium

## 2020-09-06 ENCOUNTER — Other Ambulatory Visit: Payer: Self-pay | Admitting: *Deleted

## 2020-09-06 DIAGNOSIS — C7931 Secondary malignant neoplasm of brain: Secondary | ICD-10-CM

## 2020-09-08 IMAGING — CT CT CHEST W/O CM
2 of 3 series · 15 of 36 positions shown, 18 images · non-contrast
Comparison: 05/06/2018

CLINICAL DATA: Adenocarcinoma of the left lower lobe, status post
wedge resection in 2402. Follow-up of right-sided pulmonary nodule.
On oxygen.

EXAM:
CT CHEST WITHOUT CONTRAST
TECHNIQUE: Multidetector CT imaging of the chest was performed following the
standard protocol without IV contrast.

[Series 2: thorax · axial · 0.63mm/px · z∈[+1204,+1452]mm · 12 of 146 slices shown, 15 images]
[im 11/146  mediastinal]
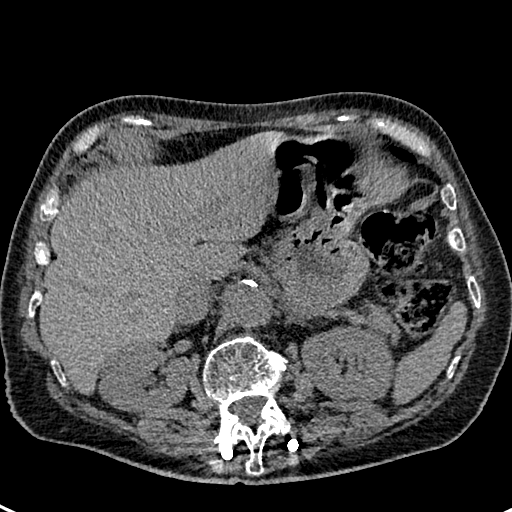
[im 11/146  lung]
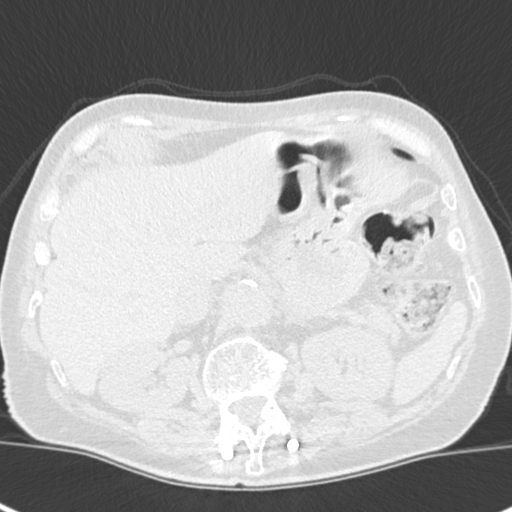
[im 22/146  lung]
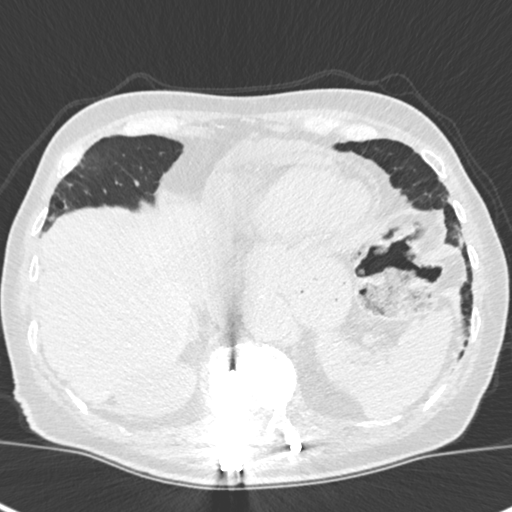
[im 33/146  lung]
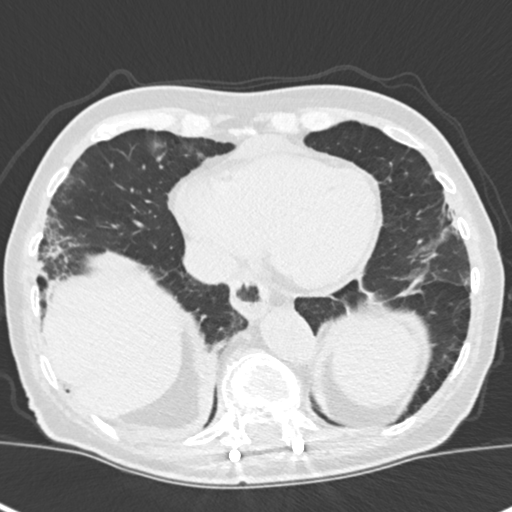
[im 43/146  lung]
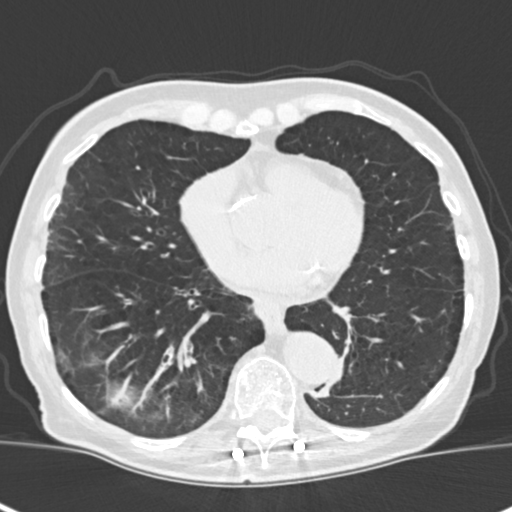
[im 54/146  mediastinal]
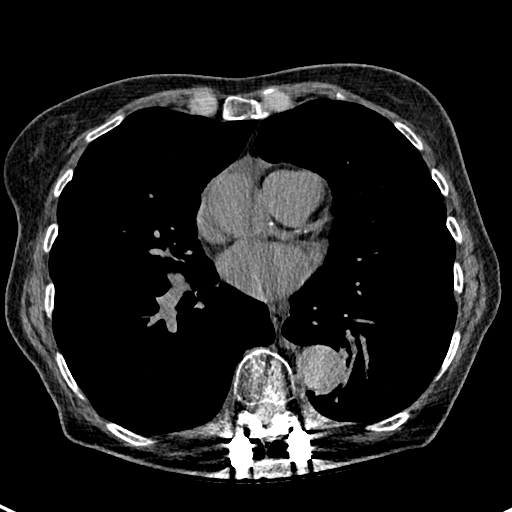
[im 54/146  lung]
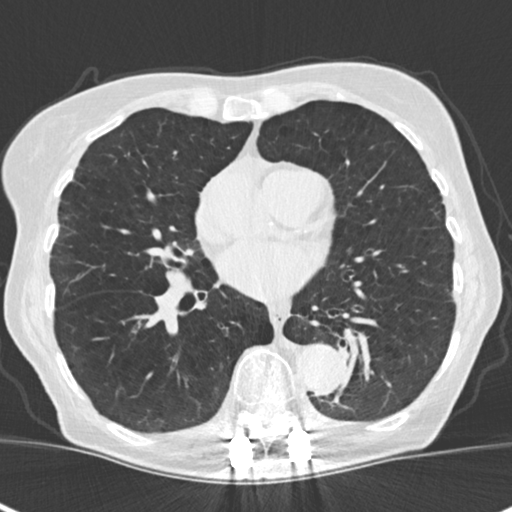
[im 65/146  lung]
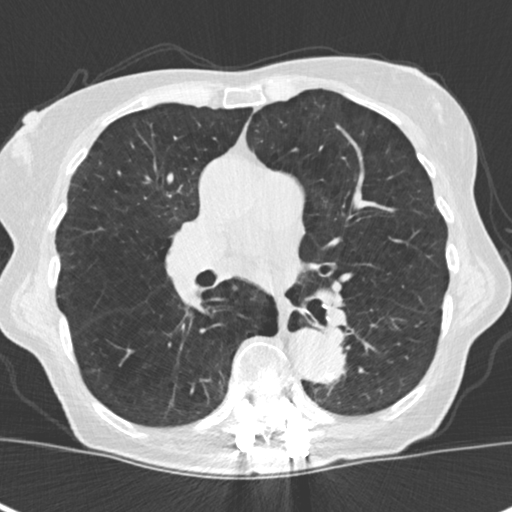
[im 81/146  lung]
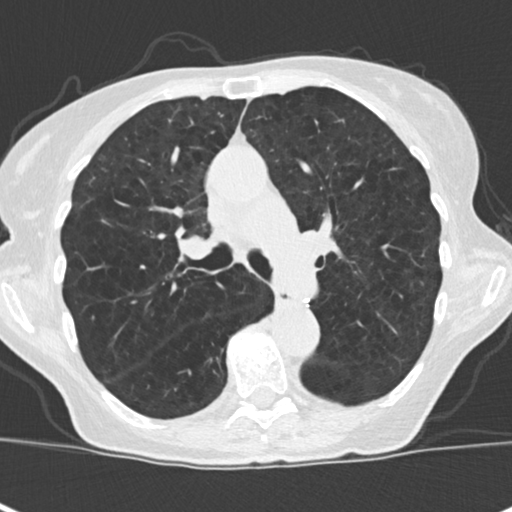
[im 92/146  lung]
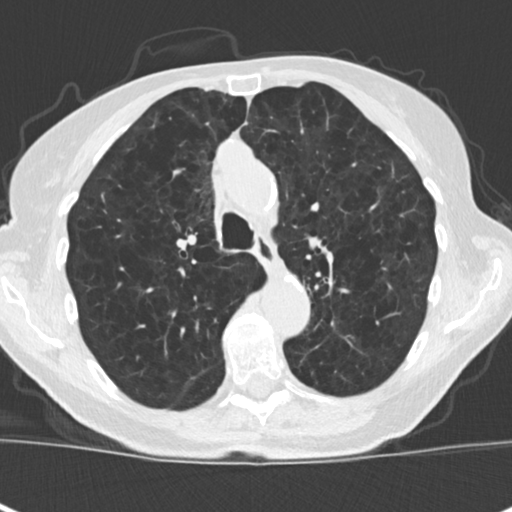
[im 103/146  mediastinal]
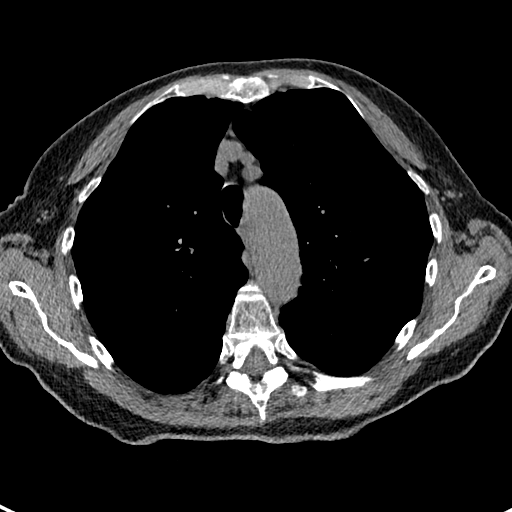
[im 103/146  lung]
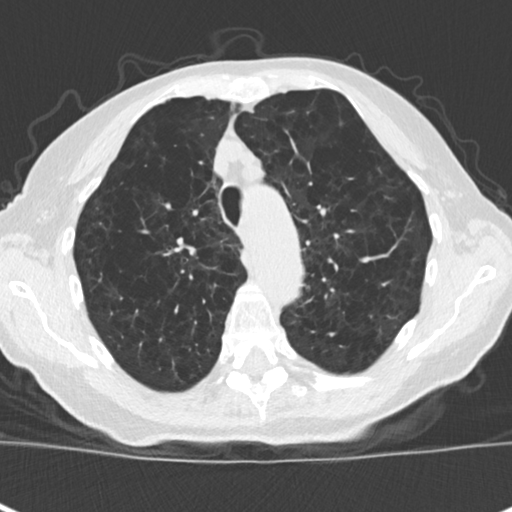
[im 113/146  lung]
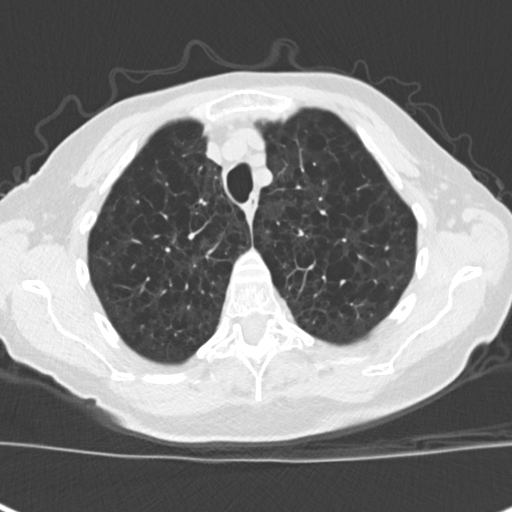
[im 124/146  lung]
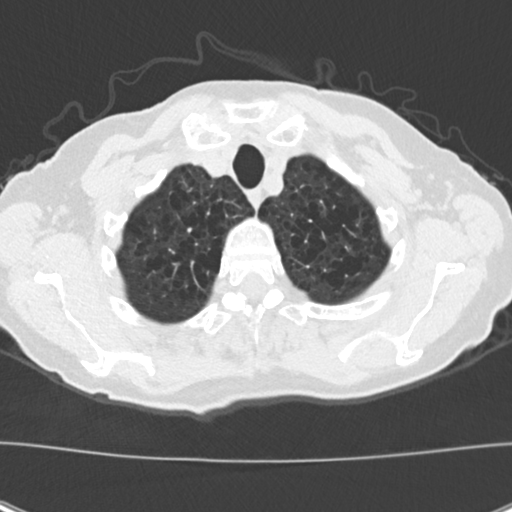
[im 135/146  lung]
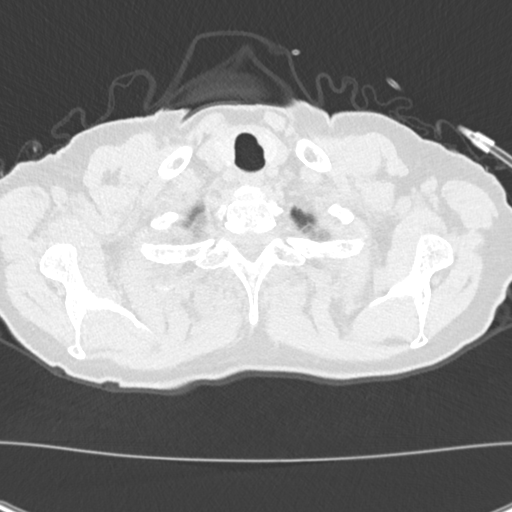

[Series 5: coronal · coronal · 0.59mm/px · 3 of 151 slices shown]
[im 31/151  lung]
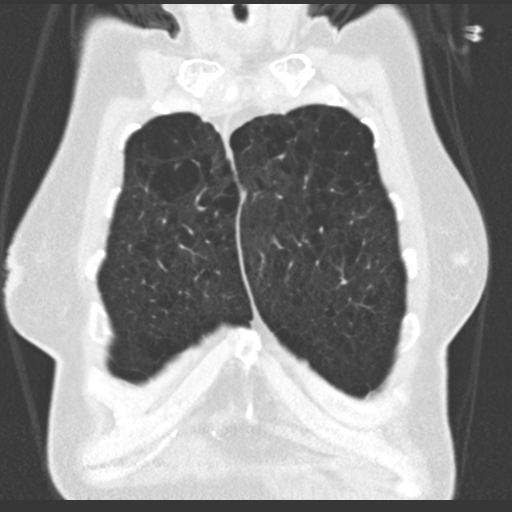
[im 61/151  lung]
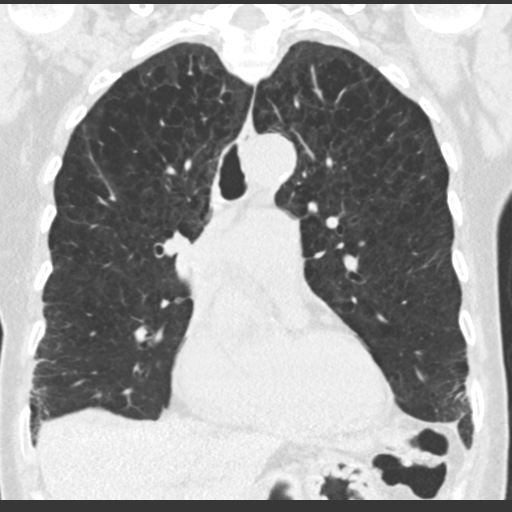
[im 91/151  lung]
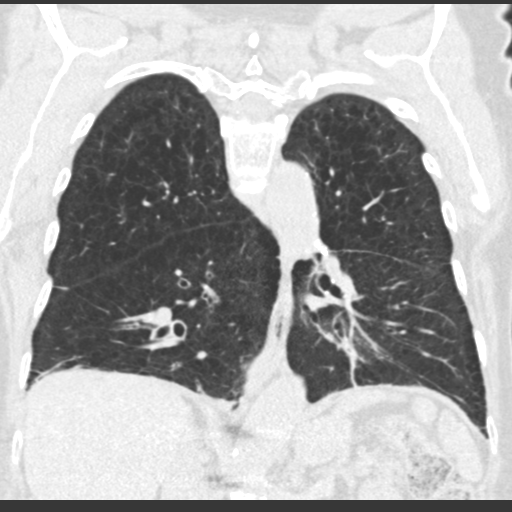

[15 of 36 positions shown; findings below may reference images not displayed]

FINDINGS: Cardiovascular: Aortic and branch vessel atherosclerosis. Tortuous
thoracic aorta. Mild cardiomegaly with similar anterior pericardial
fluid. Multivessel coronary artery atherosclerosis.

Mediastinum/Nodes: No supraclavicular adenopathy. No mediastinal or
definite hilar adenopathy, given limitations of unenhanced CT. A
small hiatal hernia.

Lungs/Pleura: No pleural fluid. Probable secretions in the
nondependent trachea. Volume loss in the left lower lobe with
endobronchial compression, similar on image 102/4.

Advanced bullous type emphysema.

Vague superior segment right lower lobe nodule is similar at 4 mm on
image 61/4.

Left lower lobe surgical sutures consistent with wedge resection. No
locally recurrent disease.

Upper Abdomen: Normal imaged portions of the liver, spleen,
pancreas, adrenal glands, left kidney. punctate right renal
collecting system calculus versus dystrophic calcification.

Musculoskeletal: Osteopenia. Lower thoracic spine fixation including
at T10 through L2. T6 severe compression deformity with minimal
ventral canal encroachment is similar. A T8 moderate compression
deformity is not significantly changed.

The T10 and T11 screws are positioned within the superior aspect of
the vertebral bodies, similar. Compression deformities involving T12
and L1 are moderate to severe and similar.
IMPRESSION: 1. Status post left lower lobe wedge resection, without recurrent or
metastatic disease.
2. Similar right lower lobe pulmonary nodule.
3. Coronary artery atherosclerosis.
4. Similar thoracic compression deformities and surgical changes.

Aortic Atherosclerosis ([id number].K) and Emphysema ([id number]).

## 2020-09-09 ENCOUNTER — Telehealth: Payer: Self-pay | Admitting: Hematology and Oncology

## 2020-09-09 NOTE — Telephone Encounter (Signed)
Scheduled appt per 12/2 sch msg - pt is aware of appt date and time   

## 2020-09-19 ENCOUNTER — Other Ambulatory Visit: Payer: Self-pay | Admitting: Radiation Therapy

## 2020-09-23 ENCOUNTER — Other Ambulatory Visit: Payer: Self-pay | Admitting: Radiation Therapy

## 2020-09-26 ENCOUNTER — Other Ambulatory Visit: Payer: Self-pay

## 2020-09-26 ENCOUNTER — Inpatient Hospital Stay: Payer: PPO | Attending: Internal Medicine | Admitting: Hematology and Oncology

## 2020-09-26 ENCOUNTER — Inpatient Hospital Stay: Payer: PPO

## 2020-09-26 ENCOUNTER — Other Ambulatory Visit: Payer: Self-pay | Admitting: Hematology and Oncology

## 2020-09-26 ENCOUNTER — Other Ambulatory Visit: Payer: Self-pay | Admitting: *Deleted

## 2020-09-26 VITALS — BP 106/80 | HR 72 | Temp 97.5°F | Resp 17 | Ht 60.0 in | Wt 113.5 lb

## 2020-09-26 DIAGNOSIS — C3432 Malignant neoplasm of lower lobe, left bronchus or lung: Secondary | ICD-10-CM | POA: Diagnosis not present

## 2020-09-26 DIAGNOSIS — F419 Anxiety disorder, unspecified: Secondary | ICD-10-CM | POA: Insufficient documentation

## 2020-09-26 DIAGNOSIS — C3492 Malignant neoplasm of unspecified part of left bronchus or lung: Secondary | ICD-10-CM

## 2020-09-26 DIAGNOSIS — J449 Chronic obstructive pulmonary disease, unspecified: Secondary | ICD-10-CM | POA: Insufficient documentation

## 2020-09-26 DIAGNOSIS — C7931 Secondary malignant neoplasm of brain: Secondary | ICD-10-CM

## 2020-09-26 DIAGNOSIS — F32A Depression, unspecified: Secondary | ICD-10-CM | POA: Insufficient documentation

## 2020-09-26 DIAGNOSIS — E222 Syndrome of inappropriate secretion of antidiuretic hormone: Secondary | ICD-10-CM | POA: Diagnosis not present

## 2020-09-26 DIAGNOSIS — E039 Hypothyroidism, unspecified: Secondary | ICD-10-CM | POA: Insufficient documentation

## 2020-09-26 DIAGNOSIS — I720 Aneurysm of carotid artery: Secondary | ICD-10-CM | POA: Insufficient documentation

## 2020-09-26 DIAGNOSIS — Z87891 Personal history of nicotine dependence: Secondary | ICD-10-CM | POA: Diagnosis not present

## 2020-09-26 DIAGNOSIS — I719 Aortic aneurysm of unspecified site, without rupture: Secondary | ICD-10-CM | POA: Diagnosis not present

## 2020-09-26 LAB — CBC WITH DIFFERENTIAL (CANCER CENTER ONLY)
Abs Immature Granulocytes: 0.01 10*3/uL (ref 0.00–0.07)
Basophils Absolute: 0 10*3/uL (ref 0.0–0.1)
Basophils Relative: 1 %
Eosinophils Absolute: 0.2 10*3/uL (ref 0.0–0.5)
Eosinophils Relative: 4 %
HCT: 35.7 % — ABNORMAL LOW (ref 36.0–46.0)
Hemoglobin: 11.9 g/dL — ABNORMAL LOW (ref 12.0–15.0)
Immature Granulocytes: 0 %
Lymphocytes Relative: 12 %
Lymphs Abs: 0.7 10*3/uL (ref 0.7–4.0)
MCH: 29.7 pg (ref 26.0–34.0)
MCHC: 33.3 g/dL (ref 30.0–36.0)
MCV: 89 fL (ref 80.0–100.0)
Monocytes Absolute: 0.4 10*3/uL (ref 0.1–1.0)
Monocytes Relative: 7 %
Neutro Abs: 4.2 10*3/uL (ref 1.7–7.7)
Neutrophils Relative %: 76 %
Platelet Count: 225 10*3/uL (ref 150–400)
RBC: 4.01 MIL/uL (ref 3.87–5.11)
RDW: 12.1 % (ref 11.5–15.5)
WBC Count: 5.6 10*3/uL (ref 4.0–10.5)
nRBC: 0 % (ref 0.0–0.2)

## 2020-09-26 LAB — CMP (CANCER CENTER ONLY)
ALT: 21 U/L (ref 0–44)
AST: 17 U/L (ref 15–41)
Albumin: 3.8 g/dL (ref 3.5–5.0)
Alkaline Phosphatase: 62 U/L (ref 38–126)
Anion gap: 6 (ref 5–15)
BUN: 20 mg/dL (ref 8–23)
CO2: 33 mmol/L — ABNORMAL HIGH (ref 22–32)
Calcium: 10 mg/dL (ref 8.9–10.3)
Chloride: 94 mmol/L — ABNORMAL LOW (ref 98–111)
Creatinine: 0.68 mg/dL (ref 0.44–1.00)
GFR, Estimated: >60 mL/min (ref >60)
Glucose, Bld: 87 mg/dL (ref 70–99)
Potassium: 4.8 mmol/L (ref 3.5–5.1)
Sodium: 133 mmol/L — ABNORMAL LOW (ref 135–145)
Total Bilirubin: 0.3 mg/dL (ref 0.3–1.2)
Total Protein: 6.9 g/dL (ref 6.5–8.1)

## 2020-09-26 MED FILL — TAGRISSO 80 MG TABLET: 80 | 30 days supply | Qty: 30 | Fill #1

## 2020-09-26 NOTE — Progress Notes (Signed)
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 0/14 lymph nodes.  No perineural invasion or LVI.  negative margins 2) 03/03/2019: presented to ED with expressive aphasia, found to have multiple brain metastasis and underwent SRS on 03/17/2019. 3) 03/18/2019: Stereotactic left parietal craniotomy for resection of tumor 4) 04/30/2020: Brain MRI on showed 2 new subcentimeter meta stasis in the right cerebellum and right frontal lobe. 5)  05/20/2020: Stereotactic radiation therapy was completed 6) 06/23/2020: establish care with Dr. [name]  7) 08/29/2020: prescribed osimirtinib therapy due to next metastatic disease in the brain.  8) 09/05/2020: started osimirtinib 31m PO daily  Interval History:  [name] 79y.o. female with medical history significant for metastatic adenocarcinoma of the lung with brain metastasis who presents for a follow up visit. The patient's last visit was on 08/29/2020 at which time she established care. In the interim since the last visit the patient started osimirtinib.   On exam today [name] notes she has tolerated the medication well so far.  She notes that she has had some occasional headaches but denies having any nausea, vomiting, or diarrhea.  She also denies having any increase in cough or shortness of breath.  She reports that her weight is stable and her appetite is good.  She she notes that "I  do not crave food but I make myself eat".  She notes that her energy is so so and potentially a smidge less good than it was prior to starting osimertinib.  Otherwise she is vitally stable and has no questions concerns or complaints.  She has been very compliant with the medication and reports that she was upset when she missed taking of the precise time about 4 minutes.  A full 10 point ROS is listed below.  MEDICAL HISTORY:  Past Medical History:  Diagnosis Date  . Adenocarcinoma of lung ([facility] 12/2010   left lung/surg only  . Allergic rhinitis   . 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[facility]    corrected by surgery 08/21/13  . Anxiety disorder   . Aortic aneurysm ([facility]   . Brain metastasis ([facility] [place]    lung cancer s/p resection 7/20  . Cancer ([facility]    Phreesia 05/26/2020  . Complication of anesthesia 2011   bloodpressure dropped during colonoscopy, not problems since  . Compression fracture   . COPD (chronic obstructive pulmonary disease) ([facility]   . Depression   . Diastolic dysfunction   . Diverticulosis   . Dysphagia   . Emphysema lung ([facility] 11/08/2014  . Headache   . Hiatal hernia   . History of kidney stones   . Hypothyroidism   . IBS (irritable bowel syndrome)   . Iron deficiency anemia due to chronic blood loss 12/05/2016  . On home O2 12/15/2013   chronic hypoxia  . Pneumonia   . PUD (peptic ulcer disease)    313yr . Shortness of breath    with exertion  . SIADH (syndrome of inappropriate ADH production) ([facility]  . Trigeminal neuralgia  SURGICAL HISTORY: Past Surgical History:  Procedure Laterality Date  . ABDOMINAL HYSTERECTOMY    . APPLICATION OF CRANIAL NAVIGATION N/A 03/18/2019   Procedure: APPLICATION OF CRANIAL NAVIGATION;  Surgeon: Lisbeth Renshaw, MD;  Location: [facility] OR;  Service: Neurosurgery;  Laterality: N/A;  . BACK SURGERY  January 13, 2014  . BRAIN SURGERY N/A    Phreesia 03/28/2020  . CATARACT EXTRACTION    . CATARACT EXTRACTION W/PHACO  09/02/2012   Procedure:  CATARACT EXTRACTION PHACO AND INTRAOCULAR LENS PLACEMENT (IOC);  Surgeon: Loraine Leriche T. Nile Riggs, MD;  Location: [facility] ORS;  Service: Ophthalmology;  Laterality: Left;  CDE:10.35  . CHOLECYSTECTOMY    . 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 COLONOSCOPY N/A 11/23/2014   Dr. Darrick Penna: moderate diverticulosis, hemorrhoids, redundant colon. next TCS in 10-15 years.   . CRANIOTOMY Left 03/18/2019   Procedure: Left stereotactic craniotomy for tumor resection;  Surgeon: Lisbeth Renshaw, MD;  Location: [facility] OR;  Service: Neurosurgery;  Laterality: Left;  Left stereotactic craniotomy for tumor resection  . ENDARTERECTOMY Right 08/21/2013   Procedure: RIGHT CAROTID ANEURYSM RESECTION;  Surgeon: Larina Earthly, MD;  Location[facility] OR;  Service: Vascular;  Laterality: Right;  . ESOPHAGEAL DILATION  02/24/2018   Procedure: ESOPHAGEAL DILATION;  Surgeon: West Bali, MD;  Location: [facility] END[facility] SUITE;  Service: Endoscopy;;  . ESOPHAGOGASTRODUODENOSCOPY  11/13/2009   w/dilation to 25mm, gastric ulceration (H.Pylori) s/p treatment  . ESOPHAGOGASTRODUODENOSCOPY  11/21/2009   distal esophageal web, gastritis  . ESOPHAGOGASTRODUODENOSCOPY  03/14/12   [id number] in the distal esophagus/Mild gastritis/small HH. + H.pylori, prescribed Pylera. Finished treatment.   . ESOPHAGOGASTRODUODENOSCOPY N/A 12/13/2017   Dr. Darrick Penna: esophageal web s/p dilation, gastritis, no h.pylori  . ESOPHAGOGASTRODUODENOSCOPY N/A 02/24/2018   Dr. Darrick Penna: web in prox esophagus and in distal esophagus s/p dilation, mild gastritis, mild pyloric stenosis, mild post ulcer duodenal deformity at D1/D2  . fatty tumor removal from lt groin    . FRACTURE SURGERY Left    hand and left ring finger  . GIVENS CAPSULE STUDY N/A 12/26/2017   incomplete  . GIVENS CAPSULE STUDY N/A 02/24/2018   normal small bowel  . KNEE SURGERY     patella tendon repair june 2019 [facility]  . LUNG CANCER SURGERY  12/2010   Left VATS, minithoracotomy,  LLL superior segmentectomy  . ORIF PATELLA Right 03/18/2018   Procedure: OPEN REDUCTION INTERNAL (ORIF) FIXATION RIGHT PATELLA;  Surgeon: Vickki Hearing, MD;  Location: [facility] ORS;  Service: Orthopedics;  Laterality: Right;  . ORIF WRIST FRACTURE Left 04/09/2014   Procedure: OPEN REDUCTION INTERNAL FIXATION (ORIF) WRIST FRACTURE;  Surgeon: Sheral Apley, MD;  Location: [facility] OR;  Service: Orthopedics;  Laterality: Left;  . PERCUTANEOUS PINNING Left 04/09/2014   Procedure: PERCUTANEOUS PINNING EXTREMITY;  Surgeon: Sheral Apley, MD;  Location: [facility] OR;  Service: Orthopedics;  Laterality: Left;  . SAVORY DILATION N/A 12/13/2017   Procedure: SAVORY DILATION;  Surgeon: West Bali, MD;  [facility];  Service: Endoscopy;  Laterality: N/A;  . TUBAL LIGATION    . vocal cord surgery  02/06/2011   laryngoscopy with bilateral vocal cord Radiesse injection for vocal cord paralysis  . YAG LASER APPLICATION Left 09/20/2015   Procedure: YAG LASER APPLICATION;  Surgeon: Jethro Bolus, MD;  Location: [facility] ORS;  Service: Ophthalmology;  Laterality: Left;    SOCIAL HISTORY: Social History   Socioeconomic History  . Marital status: Divorced    Spouse name: Not  on file  . Number of children: Not on file  . Years of education: Not on file  . Highest education level: Not on file  Occupational History  . Not on file  Tobacco Use  . Smoking status: Former Smoker    Packs/day: 0.50    Years: 20.00    Pack years: 10.00    Types: Cigarettes    Quit date: 12/21/2010    Years since quitting: 9.7  . Smokeless tobacco: Never Used  . Tobacco comment: smoking cessation info given and reviewed   Vaping Use  . Vaping Use: Never used  Substance and Sexual Activity  . Alcohol use: Not Currently    Alcohol/week: 0.0 standard drinks  . Drug use: No  . Sexual activity: Yes    Birth control/protection: Surgical  Other Topics Concern  . Not on file  Social History Narrative   Divorced since 61.Lives  alone with a dog.Marland KitchenRetired.  She receives Meals on Wheels.   Social Determinants of Health   Financial Resource Strain: Low Risk   . Difficulty of Paying Living Expenses: Not hard at all  Food Insecurity: Not on file  Transportation Needs: Not on file  Physical Activity: Not on file  Stress: Not on file  Social Connections: Not on file  Intimate Partner Violence: Not on file    FAMILY HISTORY: Family History  Problem Relation Age of Onset  . Pulmonary embolism Mother   . Heart attack Father   . Hypertension Father   . Liver cancer Sister   . Cancer Sister   . Cancer Brother   . Cancer Daughter   . Breast cancer Daughter   . Colon cancer Neg Hx     ALLERGIES:  is allergic to ciprofloxacin hcl, sulfonamide derivatives, iohexol, iodinated diagnostic agents, and prozac [fluoxetine hcl].  MEDICATIONS:  Current Outpatient Medications  Medication Sig Dispense Refill  . albuterol (VENTOLIN HFA) 108 (90 Base) MCG/ACT inhaler Inhale 2 puffs into the lungs every 6 (six) hours as needed for wheezing or shortness of breath. 18 g 2  . ALPRAZolam (XANAX) 0.5 MG tablet TAKE (1) TABLET BY MOUTH TWICE A DAY AS NEEDED. 60 tablet 3  . citalopram (CELEXA) 40 MG tablet TAKE (1/2) TABLET BY MOUTH AT BEDTIME. 45 tablet 5  . denosumab (PROLIA) 60 MG/ML SOSY injection Inject 60 mg into the skin every 6 (six) months. 1 mL 0  . diphenhydrAMINE (BENADRYL) 50 MG tablet Take 1 tablet (50 mg total) by mouth once for 1 dose. Take one hour prior to CT scan 1 tablet 0  . ferrous sulfate 325 (65 FE) MG tablet Take 325 mg by mouth daily with breakfast.    . fluticasone (FLONASE) 50 MCG/ACT nasal spray INSTILL 2 SPRAYS INTO BOTH NOSTRILS DAILY. 16 g 0  . gabapentin (NEURONTIN) 100 MG capsule Take 1 capsule (100 mg total) by mouth 3 (three) times daily. 90 capsule 3  . levocetirizine (XYZAL) 5 MG tablet TAKE ONE TABLET BY MOUTH ONCE DAILY. 30 tablet 0  . levothyroxine (SYNTHROID) 50 MCG tablet TAKE 1 TABLET BEFORE  BREAKFAST. 90 tablet 0  . mometasone (ELOCON) 0.1 % cream Apply 1 application topically daily. 45 g 0  . Multiple Vitamin (MULTIVITAMIN WITH MINERALS) TABS tablet Take 1 tablet by mouth daily.    . ondansetron (ZOFRAN) 4 MG tablet TAKE 1 TABLET BY MOUTH 3 TIMES DAILY AS NEEDED FOR NAUSEA AND VOMITING. 90 tablet 0  . osimertinib mesylate (TAGRISSO) 80 MG tablet Take 1 tablet (80  mg total) by mouth daily. 30 tablet 2  . OVER THE COUNTER MEDICATION Bone Support 2-4 tabs daily    . OXcarbazepine (TRILEPTAL) 150 MG tablet TAKE (1) TABLET BY MOUTH TWICE DAILY. 60 tablet 2  . pantoprazole (PROTONIX) 40 MG tablet TAKE (1) TABLET BY MOUTH ONCE DAILY BEFORE BREAKFAST. 90 tablet 3  . predniSONE (DELTASONE) 50 MG tablet Please take one tablet at 13 hours, 7 hours and one hour prior to CT scan 3 tablet 0  . Probiotic Product (ALIGN PO) Take by mouth daily.    . TRELEGY ELLIPTA 100-62.5-25 MCG/INH AEPB INHALE 1 PUFF INTO LUNGS DAILY. 60 each 2   No current facility-administered medications for this visit.    REVIEW OF SYSTEMS:   Constitutional: ( - ) fevers, ( - )  chills , ( - ) night sweats Eyes: ( - ) blurriness of vision, ( - ) double vision, ( - ) watery eyes Ears, nose, mouth, throat, and face: ( - ) mucositis, ( - ) sore throat Respiratory: ( - ) cough, ( - ) dyspnea, ( - ) wheezes Cardiovascular: ( - ) palpitation, ( - ) chest discomfort, ( - ) lower extremity swelling Gastrointestinal:  ( - ) nausea, ( - ) heartburn, ( - ) change in bowel habits Skin: ( - ) abnormal skin rashes Lymphatics: ( - ) new lymphadenopathy, ( - ) easy bruising Neurological: ( - ) numbness, ( - ) tingling, ( - ) new weaknesses Behavioral/Psych: ( - ) mood change, ( - ) new changes  All other systems were reviewed with the patient and are negative.  PHYSICAL EXAMINATION: ECOG PERFORMANCE STATUS: 3 - Symptomatic, >50% confined to bed  Vitals:   09/26/20 1257  BP: 106/80  Pulse: 72  Resp: 17  Temp: (!) 97.5 F  (36.4 C)  SpO2: 93%   Filed Weights   09/26/20 1257  Weight: 113 lb 8 oz (51.5 kg)    GENERAL: chronically ill appearing elderly Caucasian female. Alert, no distress and comfortable SKIN: skin color, texture, turgor are normal, no rashes or significant lesions EYES: conjunctiva are pink and non-injected, sclera clear LUNGS: clear to auscultation and percussion with normal breathing effort HEART: regular rate & rhythm and no murmurs and no lower extremity edema Musculoskeletal: no cyanosis of digits and no clubbing  PSYCH: alert & oriented x 3, fluent speech NEURO: no focal motor/sensory deficits  LABORATORY DATA:  I have reviewed the data as listed CBC Latest Ref Rng & Units 09/26/2020 08/29/2020 06/20/2020  WBC 4.0 - 10.5 K/uL 5.6 6.3 6.6  Hemoglobin 12.0 - 15.0 g/dL 11.9(L) 12.8 11.6(L)  Hematocrit 36.0 - 46.0 % 35.7(L) 37.9 35.6(L)  Platelets 150 - 400 K/uL 225 308 267    CMP Latest Ref Rng & Units 09/26/2020 08/29/2020 06/20/2020  Glucose 70 - 99 mg/dL 87 101(H) 95  BUN 8 - 23 mg/dL 20 18 25(H)  Creatinine 0.44 - 1.00 mg/dL 0.68 0.60 0.45  Sodium 135 - 145 mmol/L 133(L) 133(L) 132(L)  Potassium 3.5 - 5.1 mmol/L 4.8 4.5 4.6  Chloride 98 - 111 mmol/L 94(L) 99 95(L)  CO2 22 - 32 mmol/L 33(H) 28 30  Calcium 8.9 - 10.3 mg/dL 10.0 9.8 10.2  Total Protein 6.5 - 8.1 g/dL 6.9 6.7 6.3(L)  Total Bilirubin 0.3 - 1.2 mg/dL 0.3 0.3 0.7  Alkaline Phos 38 - 126 U/L 62 56 40  AST 15 - 41 U/L 17 16 16   ALT 0 - 44 U/L 21 16 17  RADIOGRAPHIC STUDIES:  MRI Brain 08/05/2020:   CLINICAL DATA:  Trigeminal neuralgia. Headache, cluster/trigeminal. Brain/CNS neoplasm, assess treatment response. Metastatic lung cancer. Additional history provided: SRS to 5 brain metastases 03/17/2019, left parietal craniotomy for tumor resection 03/18/2019, SRS to 2 additional metastases 01/01/2020.  EXAM: MRI HEAD WITHOUT AND WITH CONTRAST  MRA HEAD WITHOUT CONTRAST  TECHNIQUE: Multiplanar,  multiecho pulse sequences of the brain and surrounding structures were obtained without and with intravenous contrast. Angiographic images of the head were obtained using MRA technique without contrast.  CONTRAST:  49m MULTIHANCE GADOBENATE DIMEGLUMINE 529 MG/ML IV SOLN  COMPARISON:  Prior brain MRI examinations 04/30/2020 and earlier. MRA head 03/04/2019.  FINDINGS: MRI HEAD FINDINGS  Brain:  Stable generalized cerebral atrophy.  Again demonstrated is a hemosiderin stained left parietooccipital resection cavity. Irregular and gyriform enhancement has increased from the prior examination, now measuring up to 12 mm in thickness (for instance as seen on series 14, image 90) (series 15, image 9). This irregular enhancement extends to the site of a previously demonstrated 3 mm nodular focus of enhancement within the superolateral aspect of the cavity. Surrounding T2/FLAIR hyperintense signal abnormality has not significantly changed.  A previously demonstrated 4 mm lesion within the medial right cerebellum is no longer appreciated.  A 4 mm enhancing lesion within the left cerebellar hemisphere was present on the prior examination but seen to better advantage on today's study (series 14, image 48).  New 2 mm enhancing lesion more posteriorly within the medial left cerebellar hemisphere (series 14, image 50).  New 2 mm enhancing lesion within the lateral left cerebellum (series 14, image 42).  A 3 mm lesion within the high posterior right frontal lobe has decreased in size (series 14, image 145) (previously 5 mm).  A previously demonstrated 2 mm enhancing lesion within the left frontal lobe has increased in size, now measuring 8 mm (series 14, image 125) (series 11, image 39) . The lesion now demonstrates central T2/FLAIR hyperintensity and T1 hyperintensity with a peripheral low signal rim. Precontrast T1 hyperintensity limits evaluation for enhancement at this  site.  Multifocal T2/FLAIR hyperintensity elsewhere within the cerebral white matter and within the pons is nonspecific, but compatible chronic small vessel ischemic disease.  No evidence of acute infarction.  No extra-axial fluid collection.  No midline shift.  Vascular: Expected proximal arterial flow voids.  Skull and upper cervical spine: No focal suspicious marrow lesion.  Sinuses/Orbits: Visualized orbits show no acute finding. Trace ethmoid sinus mucosal thickening. No significant mastoid effusion.  MRA HEAD FINDINGS  The intracranial internal carotid arteries are patent. The M1 middle cerebral arteries are patent without significant stenosis. No M2 proximal branch occlusion or high-grade proximal stenosis is identified. The anterior cerebral arteries are patent. 1-2 mm inferiorly projecting vascular protrusion arising from the supraclinoid right ICA which may reflect an infundibulum or small aneurysm (series 103, image 12).  The intracranial vertebral arteries are patent. The basilar artery is patent. The posterior cerebral arteries are patent.  IMPRESSION: MRI brain:  1. Irregular and gyriform enhancement at site of the left parietooccipital resection cavity has increased, now measuring up to 12 mm in thickness. This enhancement extends to the site of a previously demonstrated 3 mm nodular enhancing focus within the superolateral aspect of the cavity. Findings may reflect post-treatment inflammation or recurrent tumor. Short interval MRI follow-up is recommended. Surrounding T2 hyperintense signal abnormality has not appreciably changed. 2. New 2 mm metastasis within the posteromedial left cerebellar hemisphere. 3. New 2  mm metastasis within the lateral left cerebellum. 4. An additional 4 mm metastasis within the left cerebellum was present on the prior MRI of 04/30/2020, but is seen to better advantage on today's study. 5. A previously  demonstrated 4 mm metastasis within the medial right cerebellum is no longer appreciated 6. 8 mm metastasis within the paramedian left frontal lobe, increased in size and now demonstrating precontrast T1 hyperintensity which limits evaluation for enhancement. 7. A 3 mm lesion within the high posterior right frontal lobe has decreased in size.  MRA head:  1. No intracranial large vessel occlusion or proximal high-grade arterial stenosis. 2. 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   Electronically Signed   By: Kellie Simmering DO   On: 08/05/2020 13:49  No results found.  ASSESSMENT & PLAN [name] 79 y.o. female with medical history significant for metastatic adenocarcinoma of the lung with brain metastasis who presents for a follow up visit.  After review the records, review of the labs, review the imaging, discussion with the patient the findings are most consistent with a stage IV adenocarcinoma of the lung with a small lung lesion and metastatic spread to the brain.  Her metastatic brain mets have been treated with resection of tumor on 03/18/2019 as well as SRS on 12/12/2019.  Most recently she was found to have 2 new subcentimeter lesions and underwent stereotactic radiation therapy which was completed on 05/20/2020. Unfortunately on 08/05/2020 she was noted to have new CNS metastatic disease.   There are limitations in what therapies can be used for this patient given her poor functional status.  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  After extensive discussions with the patient's about the risks and benefits of this treatment she was agreeable to starting osimertinib 80 mg p.o. daily for her metastatic adenocarcinoma of the lung. She started this treatment on 09/05/2020.  On exam today [name] notes that she is tolerating treatment well.  She is having some occasional headaches but  no nausea, vomiting, or diarrhea.  Overall she is tolerating Tagrisso well and is willing and able to proceed at this time.  Restaging scans are scheduled for January 2022.  #Stage IV ([id number]) adenocarcinoma of the lung: -Left lower lobe resection on 12/21/2010, 14 lymph nodes negative, 2.2 cm tumor size, margins negative. -most recent CNS scan shows continued new metastatic disease, with several sub centimeter lesions reported on last scan.  --started therapy with osimertinib 80 mg PO daily for her EGFR Exon 19 mutation. She started on 09/05/2020 -CT chest with contrast on 03/10/2020 showed interval progression of left lower lobe lung nodules measuring up to 7 mm. Repeat CT scan on 06/22/2020 shows stable left lower lobe pulmonary nodules.  Tree-in-bud opacities in the right upper lobe along the minor fissure may represent infectious/inflammatory process. Continue CT scans q 3 months.  -PD-L1 testing was 1%. -- Patient is a poor candidate for systemic therapy, though she does have an EGFR Exon 19 mutation.  --RTC in 4 weeks time to assure continued tolerance of osimirtenib.   #Brain metastasis, progressing. -Presentation with expressive aphasia to ER on 03/03/2019 along with right-sided weakness. Found to have multiple brain metastasis and underwent SRS on 03/17/2019. 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 -Underwent SRS on 01/01/2020 for 2 subcentimeter brain lesions. -Brain MRI on 04/30/2020 showed 2  new subcentimeter meta stasis in the right cerebellum and right frontal lobe. -Stereotactic radiation therapy was completed on 05/20/2020 --most recent MRI on 08/05/2020 showed progression on disease. Starting systemic therapy as above.   No orders of the defined types were placed in this encounter.   All questions were answered. The patient knows to call the clinic with any problems, questions or  concerns.  A total of more than 30 minutes were spent on this encounter and over half of that time was spent on counseling and coordination of care as outlined above.   Ledell Peoples, MD Department of Hematology/Oncology [place] [facility] Phone: [phone] Pager: [phone] Email: [email or web address]  09/26/2020 4:04 PM    Suszanne Conners HA, Su JM, Timnath SH, Rowe Pavy, [place] MJ. 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 J Thorac Oncol. 2020 Nov;15(11):1758-1766. doi: 10.1016/[email or web address]. Epub 2020 Jul 9. PMID: [id number].  --Osimertinib is a promising treatment option for EGFR-mutated NSCLC with LM regardless of T790M mutational status.

## 2020-09-29 ENCOUNTER — Other Ambulatory Visit: Payer: Self-pay | Admitting: Family Medicine

## 2020-10-01 DIAGNOSIS — J9621 Acute and chronic respiratory failure with hypoxia: Secondary | ICD-10-CM | POA: Diagnosis not present

## 2020-10-01 DIAGNOSIS — J449 Chronic obstructive pulmonary disease, unspecified: Secondary | ICD-10-CM | POA: Diagnosis not present

## 2020-10-01 DIAGNOSIS — C3490 Malignant neoplasm of unspecified part of unspecified bronchus or lung: Secondary | ICD-10-CM | POA: Diagnosis not present

## 2020-10-01 DIAGNOSIS — R0902 Hypoxemia: Secondary | ICD-10-CM | POA: Diagnosis not present

## 2020-10-05 ENCOUNTER — Telehealth: Payer: Self-pay | Admitting: Pharmacist

## 2020-10-05 NOTE — Progress Notes (Addendum)
Chronic Care Management Pharmacy Assistant   Name: [name]  MRN: [id number] DOB: 1941-01-28  Reason for Encounter: Disease State  For HTN  Patient Questions:  1.  Have you seen any other providers since your last visit? Yes.   2.  Any changes in your medicines or health? Yes.    PCP : [name], MD   Their chronic conditions include: Hypertension, hyperlipidemia, hypothyroidism COPD, Brain metastasis, osteoporosis  Office Visits: 08/29/20 Patient informed to start Tagrisso 80 mg 1 tablet daily on 09/05/20  07/04/20 with Dr.Pickard started Gabapentin 100 mg and Mometasone Furoate 0.1%   06/23/20 Prolia 60 mg injection performed.   Consults: 09/26/20 Oncology with Orson Slick, MD Completed Cephalexin 500 mg.   08/29/20 Oncology with Orson Slick, MD Started Osimertinib Mesylate 80 mg. Restarted another course of Cephalexin 500 mg 3 times daily. Started Prednisone 50 mg for a CT scan.  08/09/20 Oncology with Ventura Sellers, MD No medication changes.   06/23/20 Oncology with Orson Slick, MD Discontinued Meloxicam 7.5 mg and Oxcarbazepine 150 mg   06/23/20 Rad Onc with Bruning, Ashlyn, PA-C No medication changes.   06/22/20 Oncology with Tollie Pizza for follow-up for metastatic left lung cancer and iron deficiency state No medication changes.   Allergies:   Allergies  Allergen Reactions   Ciprofloxacin Hcl Hives   Sulfonamide Derivatives Hives and Rash   Iohexol Other (See Comments)    UNSPECIFIED REACTION  Desc: [facility], pt is allergic to IV contrast, no type of reaction was available. Pt cannot remember but first reacted around 15 yrs ago from an IVP. Notes were date from 2009 at urology center., Onset Date: [id number]    Iodinated Diagnostic Agents Rash and Itching   Prozac [Fluoxetine Hcl] Rash    Medications: Outpatient Encounter Medications as of 10/05/2020  Medication Sig   albuterol (VENTOLIN HFA) 108  (90 Base) MCG/ACT inhaler Inhale 2 puffs into the lungs every 6 (six) hours as needed for wheezing or shortness of breath.   ALPRAZolam (XANAX) 0.5 MG tablet TAKE (1) TABLET BY MOUTH TWICE A DAY AS NEEDED.   citalopram (CELEXA) 40 MG tablet TAKE (1/2) TABLET BY MOUTH AT BEDTIME.   denosumab (PROLIA) 60 MG/ML SOSY injection Inject 60 mg into the skin every 6 (six) months.   diphenhydrAMINE (BENADRYL) 50 MG tablet Take 1 tablet (50 mg total) by mouth once for 1 dose. Take one hour prior to CT scan   ferrous sulfate 325 (65 FE) MG tablet Take 325 mg by mouth daily with breakfast.   fluticasone (FLONASE) 50 MCG/ACT nasal spray INSTILL 2 SPRAYS INTO BOTH NOSTRILS DAILY.   gabapentin (NEURONTIN) 100 MG capsule Take 1 capsule (100 mg total) by mouth 3 (three) times daily.   levocetirizine (XYZAL) 5 MG tablet TAKE ONE TABLET BY MOUTH ONCE DAILY.   levothyroxine (SYNTHROID) 50 MCG tablet TAKE 1 TABLET BEFORE BREAKFAST.   mometasone (ELOCON) 0.1 % cream Apply 1 application topically daily.   Multiple Vitamin (MULTIVITAMIN WITH MINERALS) TABS tablet Take 1 tablet by mouth daily.   ondansetron (ZOFRAN) 4 MG tablet TAKE 1 TABLET BY MOUTH 3 TIMES DAILY AS NEEDED FOR NAUSEA AND VOMITING.   osimertinib mesylate (TAGRISSO) 80 MG tablet Take 1 tablet (80 mg total) by mouth daily.   OVER THE COUNTER MEDICATION Bone Support 2-4 tabs daily   OXcarbazepine (TRILEPTAL) 150 MG tablet TAKE (1) TABLET BY MOUTH TWICE DAILY.   pantoprazole (PROTONIX)  40 MG tablet TAKE (1) TABLET BY MOUTH ONCE DAILY BEFORE BREAKFAST.   predniSONE (DELTASONE) 50 MG tablet Please take one tablet at 13 hours, 7 hours and one hour prior to CT scan   Probiotic Product (ALIGN PO) Take by mouth daily.   TRELEGY ELLIPTA 100-62.5-25 MCG/INH AEPB INHALE 1 PUFF INTO LUNGS DAILY.   No facility-administered encounter medications on file as of 10/05/2020.    Current Diagnosis: Patient Active Problem List   Diagnosis Date Noted   Abdominal bloating  01/13/2020   Primary cancer of left lower lobe of lung ([place]) 12/29/2019   Flatulence 11/17/2019   IBS (irritable bowel syndrome)    Acute hypoxemic respiratory failure ([place]) 05/25/2019   Pharyngeal dysphagia 05/07/2019   Brain metastasis ([place]) 04/17/2019   Brain tumor ([place]) 03/18/2019   Essential hypertension 03/12/2019   Brain metastases ([place]) 03/06/2019   Cytotoxic brain edema ([place]) 03/04/2019   ICH (intracerebral hemorrhage) ([place]) - L parietal lobe 03/03/2019   Nontraumatic intracerebral hemorrhage, unspecified ([place]) 03/03/2019   Osteoporosis 05/16/2018   S/P ORIF (open reduction internal fixation) fracture right patella 03/18/18 03/26/2018   Other specified postprocedural states 03/26/2018   Cellulitis, wound, post-operative 03/22/2018   Fracture of right patella 03/18/2018   Normocytic anemia    Esophageal dysphagia 11/27/2017   Heme positive stool 11/27/2017   Nasal crusting 02/06/2017   Iron deficiency anemia 12/05/2016   SIADH (syndrome of inappropriate ADH production) ([place])    Polypharmacy 09/14/2016   Muscle weakness (generalized)    Diastolic dysfunction 10/10/7251   Syncope 09/10/2016   Rib pain on left side    Hypersomnia 02/16/2016   Acute bronchitis 02/16/2016   Exertional dyspnea 12/20/2015   HLD (hyperlipidemia) 11/23/2014   Change in bowel habits 11/10/2014   Abdominal pain, left lower quadrant 11/10/2014   Emphysema lung ([place]) 66/44/0347   Helicobacter pylori gastritis 07/30/2014   Acute on chronic respiratory failure with hypoxia ([place]) 04/10/2014   Wrist fracture 04/09/2014   Occlusion and stenosis of carotid artery without mention of cerebral infarction 03/16/2014   T12 burst fracture ([place]) 01/13/2014   Constipation 12/14/2013   Acute respiratory failure with hypoxia ([place]) 12/14/2013   COPD (chronic obstructive pulmonary disease) ([place]) 12/14/2013   Compression fracture of thoracic vertebra ([place]) 12/14/2013   Hypoxia 12/13/2013   Compression fracture  12/13/2013   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[place]) 08/11/2013   AAA (abdominal aortic aneurysm) without rupture ([place]) 02/26/2012   Dyspepsia 02/11/2012   Aneurysm ([place]) 02/11/2012   Trigeminal neuralgia    Epigastric pain 02/21/2011   Dysphagia 02/21/2011   Adenocarcinoma of lung ([place]) 42/59/5638   HELICOBACTER PYLORI GASTRITIS, HX OF 12/30/2009   Radiographic dye allergy status 12/30/2009   Personal history of other diseases of the digestive system 12/30/2009    Goals Addressed   None    Reviewed chart prior to disease state call. Spoke with patient regarding BP  Recent Office Vitals: BP Readings from Last 3 Encounters:  09/26/20 106/80  08/29/20 108/78  08/09/20 117/70   Pulse Readings from Last 3 Encounters:  09/26/20 72  08/29/20 80  08/09/20 93    Wt Readings from Last 3 Encounters:  09/26/20 113 lb 8 oz (51.5 kg)  08/29/20 114 lb 14.4 oz (52.1 kg)  08/09/20 113 lb 1.6 oz (51.3 kg)     Kidney Function Lab Results  Component Value Date/Time   CREATININE 0.68 09/26/2020 12:30 PM   CREATININE 0.60 08/29/2020 09:33 AM   CREATININE 0.83 01/26/2020 04:23 PM  CREATININE 0.92 11/27/2019 02:48 PM   GFR 107.85 02/16/2016 11:50 AM   GFRNONAA >60 09/26/2020 12:30 PM   GFRNONAA 68 01/26/2020 04:23 PM   GFRAA >60 06/20/2020 09:49 AM   GFRAA 78 01/26/2020 04:23 PM    BMP Latest Ref Rng & Units 09/26/2020 08/29/2020 06/20/2020  Glucose 70 - 99 mg/dL 87 101(H) 95  BUN 8 - 23 mg/dL 20 18 25(H)  Creatinine 0.44 - 1.00 mg/dL 0.68 0.60 0.45  BUN/Creat Ratio 6 - 22 (calc) - - -  Sodium 135 - 145 mmol/L 133(L) 133(L) 132(L)  Potassium 3.5 - 5.1 mmol/L 4.8 4.5 4.6  Chloride 98 - 111 mmol/L 94(L) 99 95(L)  CO2 22 - 32 mmol/L 33(H) 28 30  Calcium 8.9 - 10.3 mg/dL 10.0 9.8 10.2    Current antihypertensive regimen:  None  How often are you checking your Blood Pressure?  Patient stated she has a blood pressure cuff at home but does not use it because it is fairly old so her blood pressure  usually gets checked when she goes to the doctors office Patient stated her blood pressure is always good.   Current home BP readings: N/A  What recent interventions/DTPs have been made by any provider to improve Blood Pressure control since last CPP Visit: None.  Any recent hospitalizations or ED visits since last visit with CPP? No.   What diet changes have been made to improve Blood Pressure Control?  Patient stated she eats a balanced diet and eats sweets rarely.   What exercise is being done to improve your Blood Pressure Control?  Patient stated she does chair exercises because she is sim[employer] mobile.  Adherence Review: Is the patient currently on ACE/ARB medication? No.   Does the patient have >5 day gap between last estimated fill dates? No, CPP Please Check.  Patient is not having any problems getting her medication. She gets her medication through the mail, she uses [email or web address].  Follow-Up:  Pharmacist Review   Charlann Lange, RMA Clinical Pharmacist Assistant [phone]  4 minutes spent in review, coordination, and documentation.  Reviewed by: Beverly Milch, PharmD Clinical Pharmacist [facility] [phone]

## 2020-10-10 ENCOUNTER — Telehealth: Payer: Self-pay | Admitting: Pharmacist

## 2020-10-10 NOTE — Progress Notes (Signed)
Chronic Care Management Pharmacy Assistant   Name: [name]  MRN: [id number] DOB: Feb 11, 1941  Reason for Encounter: Disease State for COPD  Patient Questions:  1.  Have you seen any other providers since your last visit? No.   2.  Any changes in your medicines or health? No.   PCP : [name], MD   Their chronic conditions include:Hypertension, hyperlipidemia, hypothyroidism COPD, Brainmetastasis, osteoporosis  Office Visits: None since 10/05/20  Consults: None since 10/05/20   Allergies:   Allergies  Allergen Reactions  . Ciprofloxacin Hcl Hives  . Sulfonamide Derivatives Hives and Rash  . Iohexol Other (See Comments)    UNSPECIFIED REACTION  Desc: [facility], pt is allergic to IV contrast, no type of reaction was available. Pt cannot remember but first reacted around 15 yrs ago from an IVP. Notes were date from 2009 at urology center., Onset Date: [id number]   . Iodinated Diagnostic Agents Rash and Itching  . Prozac [Fluoxetine Hcl] Rash    Medications: Outpatient Encounter Medications as of 10/10/2020  Medication Sig  . albuterol (VENTOLIN HFA) 108 (90 Base) MCG/ACT inhaler Inhale 2 puffs into the lungs every 6 (six) hours as needed for wheezing or shortness of breath.  . ALPRAZolam (XANAX) 0.5 MG tablet TAKE (1) TABLET BY MOUTH TWICE A DAY AS NEEDED.  . citalopram (CELEXA) 40 MG tablet TAKE (1/2) TABLET BY MOUTH AT BEDTIME.  Marland Kitchen denosumab (PROLIA) 60 MG/ML SOSY injection Inject 60 mg into the skin every 6 (six) months.  . diphenhydrAMINE (BENADRYL) 50 MG tablet Take 1 tablet (50 mg total) by mouth once for 1 dose. Take one hour prior to CT scan  . ferrous sulfate 325 (65 FE) MG tablet Take 325 mg by mouth daily with breakfast.  . fluticasone (FLONASE) 50 MCG/ACT nasal spray INSTILL 2 SPRAYS INTO BOTH NOSTRILS DAILY.  Marland Kitchen gabapentin (NEURONTIN) 100 MG capsule Take 1 capsule (100 mg total) by mouth 3 (three) times daily.  Marland Kitchen levocetirizine (XYZAL) 5  MG tablet TAKE ONE TABLET BY MOUTH ONCE DAILY.  Marland Kitchen levothyroxine (SYNTHROID) 50 MCG tablet TAKE 1 TABLET BEFORE BREAKFAST.  . mometasone (ELOCON) 0.1 % cream Apply 1 application topically daily.  . Multiple Vitamin (MULTIVITAMIN WITH MINERALS) TABS tablet Take 1 tablet by mouth daily.  . ondansetron (ZOFRAN) 4 MG tablet TAKE 1 TABLET BY MOUTH 3 TIMES DAILY AS NEEDED FOR NAUSEA AND VOMITING.  Marland Kitchen osimertinib mesylate (TAGRISSO) 80 MG tablet Take 1 tablet (80 mg total) by mouth daily.  Marland Kitchen OVER THE COUNTER MEDICATION Bone Support 2-4 tabs daily  . OXcarbazepine (TRILEPTAL) 150 MG tablet TAKE (1) TABLET BY MOUTH TWICE DAILY.  . pantoprazole (PROTONIX) 40 MG tablet TAKE (1) TABLET BY MOUTH ONCE DAILY BEFORE BREAKFAST.  Marland Kitchen predniSONE (DELTASONE) 50 MG tablet Please take one tablet at 13 hours, 7 hours and one hour prior to CT scan  . Probiotic Product (ALIGN PO) Take by mouth daily.  . TRELEGY ELLIPTA 100-62.5-25 MCG/INH AEPB INHALE 1 PUFF INTO LUNGS DAILY.   No facility-administered encounter medications on file as of 10/10/2020.    Current Diagnosis: Patient Active Problem List   Diagnosis Date Noted  . Abdominal bloating 01/13/2020  . Primary cancer of left lower lobe of lung ([place]) 12/29/2019  . Flatulence 11/17/2019  . IBS (irritable bowel syndrome)   . Acute hypoxemic respiratory failure ([place]) 05/25/2019  . Pharyngeal dysphagia 05/07/2019  . Brain metastasis ([place]) 04/17/2019  . Brain tumor ([place]) 03/18/2019  . Essential  hypertension 03/12/2019  . Brain metastases ([place]) 03/06/2019  . Cytotoxic brain edema ([place]) 03/04/2019  . ICH (intracerebral hemorrhage) ([facility]) - L parietal lobe 03/03/2019  . Nontraumatic intracerebral hemorrhage, unspecified ([place]) 03/03/2019  . Osteoporosis 05/16/2018  . S/P ORIF (open reduction internal fixation) fracture right patella 03/18/18 03/26/2018  . Other specified postprocedural states 03/26/2018  . Cellulitis, wound, post-operative 03/22/2018  . Fracture of  right patella 03/18/2018  . Normocytic anemia   . Esophageal dysphagia 11/27/2017  . Heme positive stool 11/27/2017  . Nasal crusting 02/06/2017  . Iron deficiency anemia 12/05/2016  . SIADH (syndrome of inappropriate ADH production) ([place])   . Polypharmacy 09/14/2016  . Muscle weakness (generalized)   . Diastolic dysfunction 72/53/6644  . Syncope 09/10/2016  . Rib pain on left side   . Hypersomnia 02/16/2016  . Acute bronchitis 02/16/2016  . Exertional dyspnea 12/20/2015  . HLD (hyperlipidemia) 11/23/2014  . Change in bowel habits 11/10/2014  . Abdominal pain, left lower quadrant 11/10/2014  . Emphysema lung ([place]) 11/08/2014  . Helicobacter pylori gastritis 07/30/2014  . Acute on chronic respiratory failure with hypoxia ([place]) 04/10/2014  . Wrist fracture 04/09/2014  . 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 T12 burst fracture ([place]) 01/13/2014  . Constipation 12/14/2013  . Acute respiratory failure with hypoxia ([place]) 12/14/2013  . COPD (chronic obstructive pulmonary disease) ([place]) 12/14/2013  . Compression fracture of thoracic vertebra ([place]) 12/14/2013  . Hypoxia 12/13/2013  . Compression fracture 12/13/2013  . 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[place]) 08/11/2013  . AAA (abdominal aortic aneurysm) without rupture ([place]) 02/26/2012  . Dyspepsia 02/11/2012  . Aneurysm ([place]) 02/11/2012  . Trigeminal neuralgia   . Epigastric pain 02/21/2011  . Dysphagia 02/21/2011  . Adenocarcinoma of lung ([place]) 12/07/2010  . HELICOBACTER PYLORI GASTRITIS, HX OF 12/30/2009  . Radiographic dye allergy status 12/30/2009  . Personal history of other diseases of the digestive system 12/30/2009    Goals Addressed   None    Reviewed chart prior to disease state call. Spoke with patient regarding BP  Recent Office Vitals: BP Readings from Last 3 Encounters:  09/26/20 106/80  08/29/20 108/78  08/09/20 117/70   Pulse Readings from Last 3 Encounters:   09/26/20 72  08/29/20 80  08/09/20 93    Wt Readings from Last 3 Encounters:  09/26/20 113 lb 8 oz (51.5 kg)  08/29/20 114 lb 14.4 oz (52.1 kg)  08/09/20 113 lb 1.6 oz (51.3 kg)     Kidney Function Lab Results  Component Value Date/Time   CREATININE 0.68 09/26/2020 12:30 PM   CREATININE 0.60 08/29/2020 09:33 AM   CREATININE 0.83 01/26/2020 04:23 PM   CREATININE 0.92 11/27/2019 02:48 PM   GFR 107.85 02/16/2016 11:50 AM   GFRNONAA >60 09/26/2020 12:30 PM   GFRNONAA 68 01/26/2020 04:23 PM   GFRAA >60 06/20/2020 09:49 AM   GFRAA 78 01/26/2020 04:23 PM    BMP Latest Ref Rng & Units 09/26/2020 08/29/2020 06/20/2020  Glucose 70 - 99 mg/dL 87 101(H) 95  BUN 8 - 23 mg/dL 20 18 25(H)  Creatinine 0.44 - 1.00 mg/dL 0.68 0.60 0.45  BUN/Creat Ratio 6 - 22 (calc) - - -  Sodium 135 - 145 mmol/L 133(L) 133(L) 132(L)  Potassium 3.5 - 5.1 mmol/L 4.8 4.5 4.6  Chloride 98 - 111 mmol/L 94(L) 99 95(L)  CO2 22 - 32 mmol/L 33(H) 28 30  Calcium 8.9 - 10.3 mg/dL 10.0 9.8 10.2   . Current COPD  regimen:  o Albuterol 108 (90 Base) MCG/ACT o Trelegy Ellipta 100-62.5-25 MCG/INH  . No flowsheet data found.   . Any recent hospitalizations or ED visits since last visit with CPP? No.  . Reports wheezing and shortness of breath with exercise and denies COPD symptoms, including Increased shortness of breath , Rescue medicine is not helping, Shortness of breath at rest and Symptoms worse at night.  . What recent interventions/DTPs have been made by any provider to improve breathing since last visit: None.  . Have you had exacerbation/flare-up since last visit? Patient stated it happens pretty regularly from mucus and allergies. Patient stated sometimes she can get yellow mucus to come up.  . What do you do when you are short of breath? Patient stated she uses the Rescue medication.  Respiratory Devices/Equipment . Do you have a nebulizer? No.  . Do you use a Peak Flow Meter? Patient stated she does  not have a peak flow meter.   . Do you use a maintenance inhaler? Patient stated yes. Trelegy Ellipta 100-62.5-25 MCG/INH  . How often do you forget to use your daily inhaler? Patient stated she never forgets.  . Do you use a rescue inhaler? Patient stated yes Albuterol.   . How often do you use your rescue inhaler? Patient stated she uses it several times per month   . Do you use a spacer with your inhaler? Patient stated yes but patient doesn't use it.   Adherence Review: . Does the patient have >5 day gap between last estimated fill date for maintenance inhaler medications? No, CPP Please Check.    Follow-Up:  Pharmacist Review   Charlann Lange, [place] Pharmacist Assistant [phone]

## 2020-10-16 NOTE — Progress Notes (Signed)
  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  ExacTrac registration was performed for each couch angle.  The 100% isodose line was prescribed.  6 MV X-rays were delivered in the flattening filter free beam mode. Right frontal 5 mm target was treated using 3 Dynamic Conformal Arcs to a prescription dose of 20 Gy.  ExacTrac registration was performed for each couch angle.  The 100% isodose line was prescribed.  6 MV X-rays were delivered in the flattening filter free beam mode.  Narrative: The patient tolerated radiation treatment relatively well.     Plan: The patient has completed radiation treatment. The patient will return to radiation oncology clinic for routine followup in one month. I advised her to call or return sooner if she has any questions or concerns related to her recovery or treatment. ________________________________  Sheral Apley. Tammi Klippel, M.D.

## 2020-10-18 ENCOUNTER — Encounter: Payer: Self-pay | Admitting: Hematology and Oncology

## 2020-10-19 ENCOUNTER — Ambulatory Visit (HOSPITAL_COMMUNITY)
Admission: RE | Admit: 2020-10-19 | Discharge: 2020-10-19 | Disposition: A | Discharge status: Home / Self Care | Payer: PPO | Source: Ambulatory Visit | Attending: Hematology | Admitting: Hematology

## 2020-10-19 ENCOUNTER — Other Ambulatory Visit: Payer: Self-pay

## 2020-10-19 ENCOUNTER — Inpatient Hospital Stay (HOSPITAL_COMMUNITY): Payer: PPO | Attending: Hematology

## 2020-10-19 DIAGNOSIS — J432 Centrilobular emphysema: Secondary | ICD-10-CM | POA: Diagnosis not present

## 2020-10-19 DIAGNOSIS — C3490 Malignant neoplasm of unspecified part of unspecified bronchus or lung: Secondary | ICD-10-CM | POA: Diagnosis not present

## 2020-10-19 DIAGNOSIS — C3492 Malignant neoplasm of unspecified part of left bronchus or lung: Secondary | ICD-10-CM

## 2020-10-19 DIAGNOSIS — S2231XA Fracture of one rib, right side, initial encounter for closed fracture: Secondary | ICD-10-CM | POA: Diagnosis not present

## 2020-10-19 DIAGNOSIS — C3432 Malignant neoplasm of lower lobe, left bronchus or lung: Secondary | ICD-10-CM | POA: Diagnosis not present

## 2020-10-19 DIAGNOSIS — I2510 Atherosclerotic heart disease of native coronary artery without angina pectoris: Secondary | ICD-10-CM | POA: Diagnosis not present

## 2020-10-19 LAB — CBC WITH DIFFERENTIAL/PLATELET
Abs Immature Granulocytes: 0.03 10*3/uL (ref 0.00–0.07)
Basophils Absolute: 0 10*3/uL (ref 0.0–0.1)
Basophils Relative: 0 %
Eosinophils Absolute: 0 10*3/uL (ref 0.0–0.5)
Eosinophils Relative: 0 %
HCT: 38.6 % (ref 36.0–46.0)
Hemoglobin: 12.4 g/dL (ref 12.0–15.0)
Immature Granulocytes: 0 %
Lymphocytes Relative: 4 %
Lymphs Abs: 0.3 10*3/uL — ABNORMAL LOW (ref 0.7–4.0)
MCH: 28.9 pg (ref 26.0–34.0)
MCHC: 32.1 g/dL (ref 30.0–36.0)
MCV: 90 fL (ref 80.0–100.0)
Monocytes Absolute: 0.2 10*3/uL (ref 0.1–1.0)
Monocytes Relative: 2 %
Neutro Abs: 7.4 10*3/uL (ref 1.7–7.7)
Neutrophils Relative %: 94 %
Platelets: 265 10*3/uL (ref 150–400)
RBC: 4.29 MIL/uL (ref 3.87–5.11)
RDW: 12.6 % (ref 11.5–15.5)
WBC: 7.9 10*3/uL (ref 4.0–10.5)
nRBC: 0 % (ref 0.0–0.2)

## 2020-10-19 LAB — COMPREHENSIVE METABOLIC PANEL
ALT: 19 U/L (ref 0–44)
AST: 20 U/L (ref 15–41)
Albumin: 4 g/dL (ref 3.5–5.0)
Alkaline Phosphatase: 52 U/L (ref 38–126)
Anion gap: 10 (ref 5–15)
BUN: 27 mg/dL — ABNORMAL HIGH (ref 8–23)
CO2: 27 mmol/L (ref 22–32)
Calcium: 9.6 mg/dL (ref 8.9–10.3)
Chloride: 94 mmol/L — ABNORMAL LOW (ref 98–111)
Creatinine, Ser: 0.58 mg/dL (ref 0.44–1.00)
GFR, Estimated: >60 mL/min (ref >60)
Glucose, Bld: 153 mg/dL — ABNORMAL HIGH (ref 70–99)
Potassium: 4.7 mmol/L (ref 3.5–5.1)
Sodium: 131 mmol/L — ABNORMAL LOW (ref 135–145)
Total Bilirubin: 0.4 mg/dL (ref 0.3–1.2)
Total Protein: 7.3 g/dL (ref 6.5–8.1)

## 2020-10-19 MED ORDER — IOHEXOL 300 MG/ML  SOLN
75.0000 mL | Freq: Once | INTRAMUSCULAR | Status: AC | PRN
  Administered 2020-10-19: 75 mL via INTRAVENOUS

## 2020-10-20 MED FILL — TAGRISSO 80 MG TABLET: 80 | 30 days supply | Qty: 30 | Fill #2

## 2020-10-21 ENCOUNTER — Encounter: Payer: Self-pay | Admitting: Hematology and Oncology

## 2020-10-21 ENCOUNTER — Telehealth: Payer: Self-pay | Admitting: Hematology and Oncology

## 2020-10-21 NOTE — Telephone Encounter (Signed)
Appointment made a phone visit due to weather concerns. Patient is aware.

## 2020-10-24 ENCOUNTER — Other Ambulatory Visit: Payer: PPO

## 2020-10-24 ENCOUNTER — Encounter: Payer: Self-pay | Admitting: Hematology and Oncology

## 2020-10-24 ENCOUNTER — Inpatient Hospital Stay: Payer: PPO | Attending: Internal Medicine | Admitting: Hematology and Oncology

## 2020-10-24 ENCOUNTER — Ambulatory Visit (HOSPITAL_COMMUNITY): Payer: PPO | Admitting: Hematology

## 2020-10-24 DIAGNOSIS — C3492 Malignant neoplasm of unspecified part of left bronchus or lung: Secondary | ICD-10-CM | POA: Diagnosis not present

## 2020-10-24 DIAGNOSIS — C7931 Secondary malignant neoplasm of brain: Secondary | ICD-10-CM

## 2020-10-24 NOTE — Progress Notes (Signed)
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 0/14 lymph nodes.  No perineural invasion or LVI.  negative margins 2) 03/03/2019: presented to ED with expressive aphasia, found to have multiple brain metastasis and underwent SRS on 03/17/2019. 3) 03/18/2019: Stereotactic left parietal craniotomy for resection of tumor 4) 04/30/2020: Brain MRI on showed 2 new subcentimeter meta stasis in the right cerebellum and right frontal lobe. 5)  05/20/2020: Stereotactic radiation therapy was completed 6) 06/23/2020: establish care with Dr. Lorenso Sanford  7) 08/29/2020: prescribed osimirtinib therapy due to next metastatic disease in the brain.  8) 09/05/2020: started osimirtinib 57m PO daily 9) 10/19/2020: CT Chest showed no evidence of residual disease  Interval History:  [name] 80y.o. female with medical history significant for metastatic adenocarcinoma of the lung with brain metastasis who presents for a telephone follow up visit. The patient's last visit was on 09/26/2020. In the interim since the last visit the patient had a CT chest which showed no residual disease.   On exam today [name] reports she has been tolerating the Tagrisso medication well in the interim since her last visit.  She notes that she does occasionally have some dry skin on her fingers and some occasional GERD symptoms with food that "burns her stomach".  But  otherwise has been well.  She thinks that the food that provoked this most was some sausage that she cooked recently.  She has been having some issues with fatigue, but not that much more than baseline.  She currently denies any fevers, chills, sweats, nausea vomiting or diarrhea.  A full ten point ROS is listed below.  MEDICAL HISTORY:  Past Medical History:  Diagnosis Date  . Adenocarcinoma of lung ([facility] 12/2010   left lung/surg only  . Allergic rhinitis   . 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[facility]    corrected by surgery 08/21/13  . Anxiety disorder   . Aortic aneurysm ([place]   . Brain metastasis ([facility]    lung cancer s/p resection 7/20  . Cancer ([place]    Phreesia 05/26/2020  . Complication of anesthesia 2011   bloodpressure dropped during colonoscopy, not problems since  . Compression fracture   . COPD (chronic obstructive pulmonary disease) ([facility]   . Depression   . Diastolic dysfunction   . Diverticulosis   . Dysphagia   . Emphysema lung ([place] 11/08/2014  . Headache   . Hiatal hernia   . History of kidney stones   . Hypothyroidism   . IBS (irritable bowel syndrome)   . Iron deficiency anemia due to chronic blood loss 12/05/2016  . On home O2 12/15/2013   chronic hypoxia  . Pneumonia   . PUD (peptic ulcer disease)    89yr . Shortness of breath    with exertion  . SIADH (syndrome of inappropriate ADH production) ([facility]  . Trigeminal neuralgia     SURGICAL HISTORY: Past Surgical History:  Procedure Laterality Date  . ABDOMINAL HYSTERECTOMY    . APPLICATION OF CRANIAL NAVIGATION N/A 03/18/2019  Procedure: APPLICATION OF CRANIAL NAVIGATION;  Surgeon: Ruth Renshaw, MD;  Location: [facility] OR;  Service: Neurosurgery;  Laterality: N/A;  . BACK SURGERY  January 13, 2014  . BRAIN SURGERY N/A    Phreesia 03/28/2020  . CATARACT EXTRACTION    . CATARACT EXTRACTION W/PHACO  09/02/2012   Procedure: CATARACT EXTRACTION PHACO AND INTRAOCULAR LENS PLACEMENT (IOC);  Surgeon: Loraine Leriche T. Nile Riggs,  MD;  Location: [facility] ORS;  Service: Ophthalmology;  Laterality: Left;  CDE:10.35  . CHOLECYSTECTOMY    . 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 COLONOSCOPY N/A 11/23/2014   Dr. Darrick Penna: moderate diverticulosis, hemorrhoids, redundant colon. next TCS in 10-15 years.   . CRANIOTOMY Left 03/18/2019   Procedure: Left stereotactic craniotomy for tumor resection;  Surgeon: Ruth Renshaw, MD;  Location: [facility] OR;  Service: Neurosurgery;  Laterality: Left;  Left stereotactic craniotomy for tumor resection  . ENDARTERECTOMY Right 08/21/2013   Procedure: RIGHT CAROTID ANEURYSM RESECTION;  Surgeon: Larina Earthly, MD;  Location[facility] OR;  Service: Vascular;  Laterality: Right;  . ESOPHAGEAL DILATION  02/24/2018   Procedure: ESOPHAGEAL DILATION;  Surgeon: West Bali, MD;  Location: [facility] ENDO SUITE;  Service: Endoscopy;;  . ESOPHAGOGASTRODUODENOSCOPY  11/13/2009   w/dilation to 60mm, gastric ulceration (H.Pylori) s/p treatment  . ESOPHAGOGASTRODUODENOSCOPY  11/21/2009   distal esophageal web, gastritis  . ESOPHAGOGASTRODUODENOSCOPY  03/14/12   EEA:[id number] in the distal esophagus/Mild gastritis/small HH. + H.pylori, prescribed Pylera. Finished treatment.   . ESOPHAGOGASTRODUODENOSCOPY N/A 12/13/2017   Dr. Darrick Penna: esophageal web s/p dilation, gastritis, no h.pylori  . ESOPHAGOGASTRODUODENOSCOPY N/A 02/24/2018   Dr. Darrick Penna: web in prox esophagus and in distal esophagus s/p dilation, mild gastritis, mild pyloric stenosis, mild post ulcer duodenal deformity at D1/D2  . fatty tumor removal from lt groin    . FRACTURE SURGERY Left    hand and left ring finger  . GIVENS CAPSULE STUDY N/A 12/26/2017   incomplete  . GIVENS CAPSULE STUDY N/A 02/24/2018   normal small bowel  . KNEE SURGERY     patella tendon repair june 2019 [place]  . LUNG CANCER SURGERY  12/2010   Left VATS, minithoracotomy, LLL superior segmentectomy  . ORIF PATELLA Right 03/18/2018   Procedure: OPEN REDUCTION  INTERNAL (ORIF) FIXATION RIGHT PATELLA;  Surgeon: Vickki Hearing, MD;  Location: [facility] ORS;  Service: Orthopedics;  Laterality: Right;  . ORIF WRIST FRACTURE Left 04/09/2014   Procedure: OPEN REDUCTION INTERNAL FIXATION (ORIF) WRIST FRACTURE;  Surgeon: Sheral Apley, MD;  Location: [facility] OR;  Service: Orthopedics;  Laterality: Left;  . PERCUTANEOUS PINNING Left 04/09/2014   Procedure: PERCUTANEOUS PINNING EXTREMITY;  Surgeon: Sheral Apley, MD;  Location[facility] OR;  Service: Orthopedics;  Laterality: Left;  . SAVORY DILATION N/A 12/13/2017   Procedure: SAVORY DILATION;  Surgeon: West Bali, MD;  [facility];  Service: Endoscopy;  Laterality: N/A;  . TUBAL LIGATION    . vocal cord surgery  02/06/2011   laryngoscopy with bilateral vocal cord Radiesse injection for vocal cord paralysis  . YAG LASER APPLICATION Left 09/20/2015   Procedure: YAG LASER APPLICATION;  Surgeon: Jethro Bolus, MD;  Location: [facility] ORS;  Service: Ophthalmology;  Laterality: Left;    SOCIAL HISTORY: Social History   Socioeconomic History  . Marital status: Divorced    Spouse name: Not on file  . Number of children: Not on file  . Years of education: Not on file  . Highest education level: Not on  file  Occupational History  . Not on file  Tobacco Use  . Smoking status: Former Smoker    Packs/day: 0.50    Years: 20.00    Pack years: 10.00    Types: Cigarettes    Quit date: 12/21/2010    Years since quitting: 9.8  . Smokeless tobacco: Never Used  . Tobacco comment: smoking cessation info given and reviewed   Vaping Use  . Vaping Use: Never used  Substance and Sexual Activity  . Alcohol use: Not Currently    Alcohol/week: 0.0 standard drinks  . Drug use: No  . Sexual activity: Yes    Birth control/protection: Surgical  Other Topics Concern  . Not on file  Social History Narrative   Divorced since 19.Lives alone with a dog.Marland KitchenRetired.  She receives Meals on Wheels.   Social Determinants of Health    Financial Resource Strain: Low Risk   . Difficulty of Paying Living Expenses: Not hard at all  Food Insecurity: Not on file  Transportation Needs: Not on file  Physical Activity: Not on file  Stress: Not on file  Social Connections: Not on file  Intimate Partner Violence: Not on file    FAMILY HISTORY: Family History  Problem Relation Age of Onset  . Pulmonary embolism Mother   . Heart attack Father   . Hypertension Father   . Liver cancer Sister   . Cancer Sister   . Cancer Brother   . Cancer Daughter   . Breast cancer Daughter   . Colon cancer Neg Hx     ALLERGIES:  is allergic to ciprofloxacin hcl, sulfonamide derivatives, iohexol, iodinated diagnostic agents, and prozac [fluoxetine hcl].  MEDICATIONS:  Current Outpatient Medications  Medication Sig Dispense Refill  . albuterol (VENTOLIN HFA) 108 (90 Base) MCG/ACT inhaler Inhale 2 puffs into the lungs every 6 (six) hours as needed for wheezing or shortness of breath. 18 g 2  . ALPRAZolam (XANAX) 0.5 MG tablet TAKE (1) TABLET BY MOUTH TWICE A DAY AS NEEDED. 60 tablet 3  . citalopram (CELEXA) 40 MG tablet TAKE (1/2) TABLET BY MOUTH AT BEDTIME. 45 tablet 5  . denosumab (PROLIA) 60 MG/ML SOSY injection Inject 60 mg into the skin every 6 (six) months. 1 mL 0  . diphenhydrAMINE (BENADRYL) 50 MG tablet Take 1 tablet (50 mg total) by mouth once for 1 dose. Take one hour prior to CT scan 1 tablet 0  . ferrous sulfate 325 (65 FE) MG tablet Take 325 mg by mouth daily with breakfast.    . fluticasone (FLONASE) 50 MCG/ACT nasal spray INSTILL 2 SPRAYS INTO BOTH NOSTRILS DAILY. 16 g 0  . gabapentin (NEURONTIN) 100 MG capsule Take 1 capsule (100 mg total) by mouth 3 (three) times daily. 90 capsule 3  . levocetirizine (XYZAL) 5 MG tablet TAKE ONE TABLET BY MOUTH ONCE DAILY. 30 tablet 0  . levothyroxine (SYNTHROID) 50 MCG tablet TAKE 1 TABLET BEFORE BREAKFAST. 90 tablet 0  . mometasone (ELOCON) 0.1 % cream Apply 1 application topically  daily. 45 g 0  . Multiple Vitamin (MULTIVITAMIN WITH MINERALS) TABS tablet Take 1 tablet by mouth daily.    . ondansetron (ZOFRAN) 4 MG tablet TAKE 1 TABLET BY MOUTH 3 TIMES DAILY AS NEEDED FOR NAUSEA AND VOMITING. 90 tablet 0  . osimertinib mesylate (TAGRISSO) 80 MG tablet Take 1 tablet (80 mg total) by mouth daily. 30 tablet 2  . OVER THE COUNTER MEDICATION Bone Support 2-4 tabs daily    . OXcarbazepine (TRILEPTAL)  150 MG tablet TAKE (1) TABLET BY MOUTH TWICE DAILY. 60 tablet 2  . pantoprazole (PROTONIX) 40 MG tablet TAKE (1) TABLET BY MOUTH ONCE DAILY BEFORE BREAKFAST. 90 tablet 3  . predniSONE (DELTASONE) 50 MG tablet Please take one tablet at 13 hours, 7 hours and one hour prior to CT scan 3 tablet 0  . Probiotic Product (ALIGN PO) Take by mouth daily.    . TRELEGY ELLIPTA 100-62.5-25 MCG/INH AEPB INHALE 1 PUFF INTO LUNGS DAILY. 60 each 2   No current facility-administered medications for this visit.    REVIEW OF SYSTEMS:   Constitutional: ( - ) fevers, ( - )  chills , ( - ) night sweats Eyes: ( - ) blurriness of vision, ( - ) double vision, ( - ) watery eyes Ears, nose, mouth, throat, and face: ( - ) mucositis, ( - ) sore throat Respiratory: ( - ) cough, ( - ) dyspnea, ( - ) wheezes Cardiovascular: ( - ) palpitation, ( - ) chest discomfort, ( - ) lower extremity swelling Gastrointestinal:  ( - ) nausea, ( - ) heartburn, ( - ) change in bowel habits Skin: ( - ) abnormal skin rashes Lymphatics: ( - ) new lymphadenopathy, ( - ) easy bruising Neurological: ( - ) numbness, ( - ) tingling, ( - ) new weaknesses Behavioral/Psych: ( - ) mood change, ( - ) new changes  All other systems were reviewed with the patient and are negative.  PHYSICAL EXAMINATION: Telephone Visit  LABORATORY DATA:  I have reviewed the data as listed CBC Latest Ref Rng & Units 10/19/2020 09/26/2020 08/29/2020  WBC 4.0 - 10.5 K/uL 7.9 5.6 6.3  Hemoglobin 12.0 - 15.0 g/dL 12.4 11.9(L) 12.8  Hematocrit 36.0 - 46.0  % 38.6 35.7(L) 37.9  Platelets 150 - 400 K/uL 265 225 308    CMP Latest Ref Rng & Units 10/19/2020 09/26/2020 08/29/2020  Glucose 70 - 99 mg/dL 153(H) 87 101(H)  BUN 8 - 23 mg/dL 27(H) 20 18  Creatinine 0.44 - 1.00 mg/dL 0.58 0.68 0.60  Sodium 135 - 145 mmol/L 131(L) 133(L) 133(L)  Potassium 3.5 - 5.1 mmol/L 4.7 4.8 4.5  Chloride 98 - 111 mmol/L 94(L) 94(L) 99  CO2 22 - 32 mmol/L 27 33(H) 28  Calcium 8.9 - 10.3 mg/dL 9.6 10.0 9.8  Total Protein 6.5 - 8.1 g/dL 7.3 6.9 6.7  Total Bilirubin 0.3 - 1.2 mg/dL 0.4 0.3 0.3  Alkaline Phos 38 - 126 U/L 52 62 56  AST 15 - 41 U/L 20 17 16   ALT 0 - 44 U/L 19 21 16     RADIOGRAPHIC STUDIES:  MRI Brain 08/05/2020:   CLINICAL DATA:  Trigeminal neuralgia. Headache, cluster/trigeminal. Brain/CNS neoplasm, assess treatment response. Metastatic lung cancer. Additional history provided: SRS to 5 brain metastases 03/17/2019, left parietal craniotomy for tumor resection 03/18/2019, SRS to 2 additional metastases 01/01/2020.  EXAM: MRI HEAD WITHOUT AND WITH CONTRAST  MRA HEAD WITHOUT CONTRAST  TECHNIQUE: Multiplanar, multiecho pulse sequences of the brain and surrounding structures were obtained without and with intravenous contrast. Angiographic images of the head were obtained using MRA technique without contrast.  CONTRAST:  58m MULTIHANCE GADOBENATE DIMEGLUMINE 529 MG/ML IV SOLN  COMPARISON:  Prior brain MRI examinations 04/30/2020 and earlier. MRA head 03/04/2019.  FINDINGS: MRI HEAD FINDINGS  Brain:  Stable generalized cerebral atrophy.  Again demonstrated is a hemosiderin stained left parietooccipital resection cavity. Irregular and gyriform enhancement has increased from the prior examination, now measuring up to 12  mm in thickness (for instance as seen on series 14, image 90) (series 15, image 9). This irregular enhancement extends to the site of a previously demonstrated 3 mm nodular focus of enhancement within  the superolateral aspect of the cavity. Surrounding T2/FLAIR hyperintense signal abnormality has not significantly changed.  A previously demonstrated 4 mm lesion within the medial right cerebellum is no longer appreciated.  A 4 mm enhancing lesion within the left cerebellar hemisphere was present on the prior examination but seen to better advantage on today's study (series 14, image 48).  New 2 mm enhancing lesion more posteriorly within the medial left cerebellar hemisphere (series 14, image 50).  New 2 mm enhancing lesion within the lateral left cerebellum (series 14, image 42).  A 3 mm lesion within the high posterior right frontal lobe has decreased in size (series 14, image 145) (previously 5 mm).  A previously demonstrated 2 mm enhancing lesion within the left frontal lobe has increased in size, now measuring 8 mm (series 14, image 125) (series 11, image 39) . The lesion now demonstrates central T2/FLAIR hyperintensity and T1 hyperintensity with a peripheral low signal rim. Precontrast T1 hyperintensity limits evaluation for enhancement at this site.  Multifocal T2/FLAIR hyperintensity elsewhere within the cerebral white matter and within the pons is nonspecific, but compatible chronic small vessel ischemic disease.  No evidence of acute infarction.  No extra-axial fluid collection.  No midline shift.  Vascular: Expected proximal arterial flow voids.  Skull and upper cervical spine: No focal suspicious marrow lesion.  Sinuses/Orbits: Visualized orbits show no acute finding. Trace ethmoid sinus mucosal thickening. No significant mastoid effusion.  MRA HEAD FINDINGS  The intracranial internal carotid arteries are patent. The M1 middle cerebral arteries are patent without significant stenosis. No M2 proximal branch occlusion or high-grade proximal stenosis is identified. The anterior cerebral arteries are patent. 1-2 mm inferiorly projecting  vascular protrusion arising from the supraclinoid right ICA which may reflect an infundibulum or small aneurysm (series 103, image 12).  The intracranial vertebral arteries are patent. The basilar artery is patent. The posterior cerebral arteries are patent.  IMPRESSION: MRI brain:  1. Irregular and gyriform enhancement at site of the left parietooccipital resection cavity has increased, now measuring up to 12 mm in thickness. This enhancement extends to the site of a previously demonstrated 3 mm nodular enhancing focus within the superolateral aspect of the cavity. Findings may reflect post-treatment inflammation or recurrent tumor. Short interval MRI follow-up is recommended. Surrounding T2 hyperintense signal abnormality has not appreciably changed. 2. New 2 mm metastasis within the posteromedial left cerebellar hemisphere. 3. New 2 mm metastasis within the lateral left cerebellum. 4. An additional 4 mm metastasis within the left cerebellum was present on the prior MRI of 04/30/2020, but is seen to better advantage on today's study. 5. A previously demonstrated 4 mm metastasis within the medial right cerebellum is no longer appreciated 6. 8 mm metastasis within the paramedian left frontal lobe, increased in size and now demonstrating precontrast T1 hyperintensity which limits evaluation for enhancement. 7. A 3 mm lesion within the high posterior right frontal lobe has decreased in size.  MRA head:  1. No intracranial large vessel occlusion or proximal high-grade arterial stenosis. 2. 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   Electronically Signed   By: Kellie Simmering DO   On: 08/05/2020 13:49  CT Chest W Contrast  Result Date: 10/19/2020 CLINICAL DATA:  Lung cancer. EXAM: CT CHEST  WITH CONTRAST TECHNIQUE: Multidetector CT imaging of the chest was performed during intravenous contrast administration. CONTRAST:  83m  OMNIPAQUE IOHEXOL 300 MG/ML  SOLN COMPARISON:  06/22/2020. FINDINGS: Cardiovascular: Atherosclerotic calcification of the aorta, aortic valve and coronary arteries. Heart is at the upper limits of normal in size to mildly enlarged. Small amount of anterior pericardial fluid appears similar. Mediastinum/Nodes: No pathologically enlarged mediastinal, hilar or axillary lymph nodes. Esophagus is grossly unremarkable. Lungs/Pleura: Centrilobular emphysema. Postoperative scarring in the right upper and left lower lobes. Mild scarring in the lung bases. Lungs are otherwise clear. No pleural fluid. Airway is unremarkable. Upper Abdomen: Visualized portions of the liver and adrenal glands are unremarkable. A stone is seen in the right kidney. Visualized portions of the kidneys, spleen, pancreas, stomach and bowel are otherwise unremarkable. Musculoskeletal: Degenerative and postoperative changes in the spine. Multiple thoracolumbar compression deformities, unchanged. Old right rib fracture. No worrisome lytic or sclerotic lesions. IMPRESSION: 1. No evidence of recurrent or metastatic disease. 2. Right renal stone. 3. Aortic atherosclerosis (ICD10-I70.0). Coronary artery calcification. 4.  Emphysema (ICD10-J43.9). Electronically Signed   By: MLorin PicketM.D.   On: 10/19/2020 11:33    ASSESSMENT & PLAN [name] 80y.o. female with medical history significant for metastatic adenocarcinoma of the lung with brain metastasis who presents for a follow up visit.  After review the records, review of the labs, review the imaging, discussion with the patient the findings are most consistent with a stage IV adenocarcinoma of the lung with a small lung lesion and metastatic spread to the brain.  Her metastatic brain mets have been treated with resection of tumor on 03/18/2019 as well as SRS on 12/12/2019.  Most recently she was found to have 2 new subcentimeter lesions and underwent stereotactic radiation therapy which was  completed on 05/20/2020. Unfortunately on 08/05/2020 she was noted to have new CNS metastatic disease.   There are limitations in what therapies can be used for this patient given her poor functional status.  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  After extensive discussions with the patient's about the risks and benefits of this treatment she was agreeable to starting osimertinib 80 mg p.o. daily for her metastatic adenocarcinoma of the lung. She started this treatment on 09/05/2020.  On discussion today Mrs. Yaw notes that she is tolerating treatment well. She is having some occasional heartburn and dry skin but no nausea, vomiting, or diarrhea.  Overall she is tolerating Tagrisso well and is willing and able to proceed at this time.  Restaging MRI is scheduled for later January 2022.  #Stage IV (([id number] adenocarcinoma of the lung: -Left lower lobe resection on 12/21/2010, 14 lymph nodes negative, 2.2 cm tumor size, margins negative. -most recent CNS scan shows continued new metastatic disease, with several sub centimeter lesions reported on last scan.  --started therapy with osimertinib 80 mg PO daily for her EGFR Exon 19 mutation. She started on 09/05/2020 -CT chest with contrast on 10/20/2019 showed no active disease in the chest. Continue CT scans q 3 months.  -PD-L1 testing was 1%. -- Patient is a poor candidate for systemic therapy, though she does have an EGFR Exon 19 mutation.  --RTC in 4 weeks time to assure continued tolerance of osimirtenib.   #Brain metastasis, progressing. -Presentation with expressive aphasia to ER on 03/03/2019 along with right-sided weakness. Found to have multiple brain metastasis and underwent SRS on 03/17/2019. -Stereotactic left parietal  craniotomy for resection of tumor on 03/18/2019, biopsy consistent with adenocarcinoma of lung primary. -Underwent SRS on 01/01/2020 for 2 subcentimeter brain  lesions. -Brain MRI on 04/30/2020 showed 2 new subcentimeter meta stasis in the right cerebellum and right frontal lobe. -Stereotactic radiation therapy was completed on 05/20/2020 --most recent MRI on 08/05/2020 showed progression on disease. Starting systemic therapy as above.  --Next MRI to be performed on 11/03/2020.   No orders of the defined types were placed in this encounter.   All questions were answered. The patient knows to call the clinic with any problems, questions or concerns.  A total of more than 30 minutes were spent on this encounter and over half of that time was spent on counseling and coordination of care as outlined above.   Ledell Peoples, MD Department of Hematology/Oncology [place] [facility] Phone: [phone] Pager: [phone] Email: [email or web address]  10/24/2020 4:00 PM    Werner Lean, [place], Jung HA, Su JM, Osgood SH, [place], [place]. 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 J Thorac Oncol. 2020 Nov;15(11):1758-1766. doi: 10.1016/j.jtho.2020.06.018. Epub 2020 Jul 9. PMID: [id number].  --Osimertinib is a promising treatment option for EGFR-mutated NSCLC with LM regardless of T790M mutational status.

## 2020-10-25 ENCOUNTER — Other Ambulatory Visit: Payer: Self-pay | Admitting: Family Medicine

## 2020-10-31 ENCOUNTER — Telehealth: Payer: Self-pay | Admitting: Hematology and Oncology

## 2020-10-31 NOTE — Telephone Encounter (Signed)
Called to inform patient of her upcoming appointment. Patient is aware.

## 2020-11-01 ENCOUNTER — Encounter: Payer: Self-pay | Admitting: Family Medicine

## 2020-11-01 ENCOUNTER — Other Ambulatory Visit: Payer: Self-pay | Admitting: Family Medicine

## 2020-11-01 DIAGNOSIS — R0902 Hypoxemia: Secondary | ICD-10-CM | POA: Diagnosis not present

## 2020-11-01 DIAGNOSIS — C3490 Malignant neoplasm of unspecified part of unspecified bronchus or lung: Secondary | ICD-10-CM | POA: Diagnosis not present

## 2020-11-01 DIAGNOSIS — J449 Chronic obstructive pulmonary disease, unspecified: Secondary | ICD-10-CM | POA: Diagnosis not present

## 2020-11-01 DIAGNOSIS — J9621 Acute and chronic respiratory failure with hypoxia: Secondary | ICD-10-CM | POA: Diagnosis not present

## 2020-11-01 MED ORDER — CEPHALEXIN 500 MG PO CAPS: 500.0000 mg | ORAL_CAPSULE | Freq: Three times a day (TID) | ORAL | 0 refills | Status: DC

## 2020-11-02 ENCOUNTER — Encounter: Payer: Self-pay | Admitting: Internal Medicine

## 2020-11-02 ENCOUNTER — Telehealth: Payer: Self-pay | Admitting: Internal Medicine

## 2020-11-02 NOTE — Telephone Encounter (Signed)
Scheduled apt per 1/26 sch msg - pt is aware of appt date and time

## 2020-11-03 ENCOUNTER — Ambulatory Visit
Admission: RE | Admit: 2020-11-03 | Discharge: 2020-11-03 | Disposition: A | Discharge status: Home / Self Care | Payer: PPO | Source: Ambulatory Visit | Attending: Internal Medicine | Admitting: Internal Medicine

## 2020-11-03 ENCOUNTER — Other Ambulatory Visit: Payer: Self-pay

## 2020-11-03 DIAGNOSIS — C7931 Secondary malignant neoplasm of brain: Secondary | ICD-10-CM

## 2020-11-03 DIAGNOSIS — C3490 Malignant neoplasm of unspecified part of unspecified bronchus or lung: Secondary | ICD-10-CM | POA: Diagnosis not present

## 2020-11-03 DIAGNOSIS — G9389 Other specified disorders of brain: Secondary | ICD-10-CM | POA: Diagnosis not present

## 2020-11-03 DIAGNOSIS — Q283 Other malformations of cerebral vessels: Secondary | ICD-10-CM | POA: Diagnosis not present

## 2020-11-03 MED ORDER — GADOBENATE DIMEGLUMINE 529 MG/ML IV SOLN
10.0000 mL | Freq: Once | INTRAVENOUS | Status: AC | PRN
  Administered 2020-11-03: 10 mL via INTRAVENOUS

## 2020-11-07 ENCOUNTER — Inpatient Hospital Stay: Payer: PPO

## 2020-11-10 ENCOUNTER — Other Ambulatory Visit: Payer: Self-pay

## 2020-11-10 ENCOUNTER — Inpatient Hospital Stay: Payer: PPO | Attending: Internal Medicine | Admitting: Internal Medicine

## 2020-11-10 VITALS — BP 104/66 | HR 71 | Temp 97.7°F | Resp 18 | Ht 60.0 in | Wt 111.3 lb

## 2020-11-10 DIAGNOSIS — E039 Hypothyroidism, unspecified: Secondary | ICD-10-CM | POA: Diagnosis not present

## 2020-11-10 DIAGNOSIS — C7931 Secondary malignant neoplasm of brain: Secondary | ICD-10-CM | POA: Insufficient documentation

## 2020-11-10 DIAGNOSIS — C3432 Malignant neoplasm of lower lobe, left bronchus or lung: Secondary | ICD-10-CM | POA: Diagnosis not present

## 2020-11-10 NOTE — Progress Notes (Signed)
[facility] at [place], [place] [address] [phone]  Interval Evaluation  Date of Service: 11/10/20 Patient Name: [name] Patient MRN: [id number] Patient DOB: 1941-04-12 Provider: Ventura Sellers, MD  Identifying Statement:  [name] is a 80 y.o. 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 0/14 lymph nodes.  No perineural invasion or LVI.  negative margins.   06/05/2016 Imaging   Although the previously described ground-glass attenuation nodule in the superior segment of the right lower lobe measures slightly larger on today's examination than prior studies, this is favored to be technique related. Today's study should serve as a baseline for future followup examinations. At this time, there is no central solid component to this lesion. No new nodules are noted. 2. Stable postoperative scarring in the left lower lobe and post infectious scarring throughout the visualize lung bases, as above. 3. Areas of cylindrical bronchiectasis throughout the lung bases bilaterally    05/09/2017 Imaging   IMPRESSION: 1. Status post left lower lobe wedge resection. No findings to suggest local recurrence of disease or definite metastatic disease in the thorax. Previously noted ground-glass attenuation nodule in the superior segment of the right lower lobe appears slightly smaller than the prior study, suggesting a benign etiology. 2. Diffuse bronchial wall thickening with moderate to severe centrilobular and paraseptal emphysema; imaging findings suggestive of underlying COPD. 3. Aortic atherosclerosis, in addition to left main and left circumflex coronary artery disease. Assessment for potential risk factor  modification, dietary therapy or pharmacologic therapy may be warranted, if clinically indicated. 4. Increasing anterior pericardial fluid and/or thickening. This is unlikely to be of hemodynamic significance at this time. No associated pericardial calcification. 5. New (but non acute) compression fracture of T6 with approximately 90% loss of anterior vertebral body height, in addition to multiple other chronic compression fractures of the visualize the thoracolumbar spine.   03/22/2020 Genetic Testing   PD-L1     03/29/2020 Genetic Testing   Foundation One       CNS Oncologic History 03/17/19: Left parietal pre-op SRS, plus SRS to 4 additional smaller targets 03/18/19: Left parietal craniotomy for resection of tumor with Dr. Kathyrn Sheriff  01/01/20: SRS to 2 additional sub-cm metastases 05/20/20: SRS to 2 further metastases 08/05/20: Progressive disease, started Tagrisso therapy  Interval History: The patient's records from the referring physician were obtained and reviewed and the patient interviewed to confirm this HPI.  [name] presents following recent MRI brain.  She started on Tagrisso with Dr. [name] in November, she has been tolerating treatment well.  Otherwise she denies any new or progressive neurologic deficits.  Right sided visual field impairment in static as prior since surgery.  Still utilizes walker for ambulation.  No seizures or headaches.    Medications: Current Outpatient Medications on File Prior to Visit  Medication Sig Dispense Refill  . cephALEXin (KEFLEX) 500 MG capsule Take 1 capsule (500 mg total) by mouth 3 (three) times daily. 21 capsule 0  . albuterol (VENTOLIN HFA) 108 (90 Base) MCG/ACT inhaler Inhale 2 puffs into the lungs every 6 (six) hours as needed for wheezing or shortness of breath. 18 g 2  . ALPRAZolam (XANAX) 0.5 MG tablet TAKE (1) TABLET BY MOUTH TWICE A DAY AS NEEDED.  60 tablet 0  . citalopram (CELEXA) 40 MG tablet TAKE (1/2) TABLET BY  MOUTH AT BEDTIME. 45 tablet 5  . denosumab (PROLIA) 60 MG/ML SOSY injection Inject 60 mg into the skin every 6 (six) months. 1 mL 0  . diphenhydrAMINE (BENADRYL) 50 MG tablet Take 1 tablet (50 mg total) by mouth once for 1 dose. Take one hour prior to CT scan 1 tablet 0  . ferrous sulfate 325 (65 FE) MG tablet Take 325 mg by mouth daily with breakfast.    . fluticasone (FLONASE) 50 MCG/ACT nasal spray INSTILL 2 SPRAYS INTO BOTH NOSTRILS DAILY. 16 g 0  . gabapentin (NEURONTIN) 100 MG capsule Take 1 capsule (100 mg total) by mouth 3 (three) times daily. 90 capsule 3  . levocetirizine (XYZAL) 5 MG tablet TAKE ONE TABLET BY MOUTH ONCE DAILY. 30 tablet 0  . levothyroxine (SYNTHROID) 50 MCG tablet TAKE 1 TABLET BEFORE BREAKFAST. 90 tablet 0  . mometasone (ELOCON) 0.1 % cream Apply 1 application topically daily. 45 g 0  . Multiple Vitamin (MULTIVITAMIN WITH MINERALS) TABS tablet Take 1 tablet by mouth daily.    . ondansetron (ZOFRAN) 4 MG tablet TAKE 1 TABLET BY MOUTH 3 TIMES DAILY AS NEEDED FOR NAUSEA AND VOMITING. 90 tablet 0  . osimertinib mesylate (TAGRISSO) 80 MG tablet Take 1 tablet (80 mg total) by mouth daily. 30 tablet 2  . OVER THE COUNTER MEDICATION Bone Support 2-4 tabs daily    . OXcarbazepine (TRILEPTAL) 150 MG tablet TAKE (1) TABLET BY MOUTH TWICE DAILY. 60 tablet 2  . pantoprazole (PROTONIX) 40 MG tablet TAKE (1) TABLET BY MOUTH ONCE DAILY BEFORE BREAKFAST. 90 tablet 3  . predniSONE (DELTASONE) 50 MG tablet Please take one tablet at 13 hours, 7 hours and one hour prior to CT scan 3 tablet 0  . Probiotic Product (ALIGN PO) Take by mouth daily.    . TRELEGY ELLIPTA 100-62.5-25 MCG/INH AEPB INHALE 1 PUFF INTO LUNGS DAILY. 60 each 0   No current facility-administered medications on file prior to visit.    Allergies:  Allergies  Allergen Reactions  . Ciprofloxacin Hcl Hives  . Sulfonamide Derivatives Hives and Rash  . Iohexol Other (See Comments)    UNSPECIFIED REACTION  Desc: Per  [facility], pt is allergic to IV contrast, no type of reaction was available. Pt cannot remember but first reacted around 15 yrs ago from an IVP. Notes were date from 2009 at urology center., Onset Date: [id number]   . Iodinated Diagnostic Agents Rash and Itching  . Prozac [Fluoxetine Hcl] Rash   Past Medical History:  Past Medical History:  Diagnosis Date  . 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 Allergic rhinitis   . 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 Anxiety disorder   . Aortic aneurysm ([place])   . Brain metastasis ([place])    lung cancer s/p resection 7/20  . Cancer ([place])    Phreesia 05/26/2020  . Complication of anesthesia 2011   bloodpressure dropped during colonoscopy, not problems since  . Compression fracture   . COPD (chronic obstructive pulmonary disease) ([place])   . Depression   . Diastolic dysfunction   . Diverticulosis   . Dysphagia   . Emphysema lung ([place]) 11/08/2014  . Headache   . Hiatal hernia   . History of kidney stones   . Hypothyroidism   . IBS (irritable bowel syndrome)   . Iron deficiency  anemia due to chronic blood loss 12/05/2016  . On home O2 12/15/2013   chronic hypoxia  . Pneumonia   . PUD (peptic ulcer disease)    36yrs  . Shortness of breath    with exertion  . SIADH (syndrome of inappropriate ADH production) ([place])   . Trigeminal neuralgia    Past Surgical History:  Past Surgical History:  Procedure Laterality Date  . ABDOMINAL HYSTERECTOMY    . APPLICATION OF CRANIAL NAVIGATION N/A 03/18/2019   Procedure: APPLICATION OF CRANIAL NAVIGATION;  Surgeon: Consuella Lose, MD;  Location: [facility];  Service: Neurosurgery;  Laterality: N/A;  . BACK SURGERY  January 13, 2014  . BRAIN SURGERY N/A    Phreesia 03/28/2020  . CATARACT EXTRACTION    . CATARACT EXTRACTION W/PHACO  09/02/2012   Procedure: CATARACT EXTRACTION PHACO AND INTRAOCULAR LENS PLACEMENT (IOC);  Surgeon: Elta Guadeloupe T. Gershon Crane, MD;   [facility];  Service: Ophthalmology;  Laterality: Left;  CDE:10.35  . CHOLECYSTECTOMY    . 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 COLONOSCOPY N/A 11/23/2014   Dr. Oneida Alar: moderate diverticulosis, hemorrhoids, redundant colon. next TCS in 10-15 years.   . CRANIOTOMY Left 03/18/2019   Procedure: Left stereotactic craniotomy for tumor resection;  Surgeon: Consuella Lose, MD;  Location: [facility];  Service: Neurosurgery;  Laterality: Left;  Left stereotactic craniotomy for tumor resection  . ENDARTERECTOMY Right 08/21/2013   Procedure: RIGHT CAROTID ANEURYSM RESECTION;  Surgeon: Rosetta Posner, MD;  Location: [facility];  Service: Vascular;  Laterality: Right;  . ESOPHAGEAL DILATION  02/24/2018   Procedure: ESOPHAGEAL DILATION;  Surgeon: Danie Binder, MD;  [facility];  Service: Endoscopy;;  . ESOPHAGOGASTRODUODENOSCOPY  11/13/2009   w/dilation to 90mm, gastric ulceration (H.Pylori) s/p treatment  . ESOPHAGOGASTRODUODENOSCOPY  11/21/2009   distal esophageal web, gastritis  . ESOPHAGOGASTRODUODENOSCOPY  03/14/12   VOZ:[id number] in the distal esophagus/Mild gastritis/small HH. + H.pylori, prescribed Pylera. Finished treatment.   . ESOPHAGOGASTRODUODENOSCOPY N/A 12/13/2017   Dr. Oneida Alar: esophageal web s/p dilation, gastritis, no h.pylori  . ESOPHAGOGASTRODUODENOSCOPY N/A 02/24/2018   Dr. Oneida Alar: web in prox esophagus and in distal esophagus s/p dilation, mild gastritis, mild pyloric stenosis, mild post ulcer duodenal deformity at D1/D2  . fatty tumor removal from lt groin    . FRACTURE SURGERY Left    hand and left ring finger  . GIVENS CAPSULE STUDY N/A 12/26/2017   incomplete  . GIVENS CAPSULE STUDY N/A 02/24/2018   normal small bowel  . KNEE SURGERY     patella tendon repair june 2019 [facility]  . LUNG CANCER SURGERY  12/2010   Left VATS, minithoracotomy, LLL superior segmentectomy  . ORIF PATELLA Right 03/18/2018   Procedure: OPEN REDUCTION INTERNAL  (ORIF) FIXATION RIGHT PATELLA;  Surgeon: Carole Civil, MD;  [facility];  Service: Orthopedics;  Laterality: Right;  . ORIF WRIST FRACTURE Left 04/09/2014   Procedure: OPEN REDUCTION INTERNAL FIXATION (ORIF) WRIST FRACTURE;  Surgeon: Renette Butters, MD;  Location: [facility];  Service: Orthopedics;  Laterality: Left;  . PERCUTANEOUS PINNING Left 04/09/2014   Procedure: PERCUTANEOUS PINNING EXTREMITY;  Surgeon: Renette Butters, MD;  Location: [facility];  Service: Orthopedics;  Laterality: Left;  . SAVORY DILATION N/A 12/13/2017   Procedure: SAVORY DILATION;  Surgeon: Danie Binder, MD;  [facility];  Service: Endoscopy;  Laterality: N/A;  . TUBAL LIGATION    . vocal cord surgery  02/06/2011   laryngoscopy  with bilateral vocal cord Radiesse injection for vocal cord paralysis  . YAG LASER APPLICATION Left 29/56/2130   Procedure: YAG LASER APPLICATION;  Surgeon: Rutherford Guys, MD;  [facility];  Service: Ophthalmology;  Laterality: Left;   Social History:  Social History   Socioeconomic History  . Marital status: Divorced    Spouse name: Not on file  . Number of children: Not on file  . Years of education: Not on file  . Highest education level: Not on file  Occupational History  . Not on file  Tobacco Use  . Smoking status: Former Smoker    Packs/day: 0.50    Years: 20.00    Pack years: 10.00    Types: Cigarettes    Quit date: 12/21/2010    Years since quitting: 9.8  . Smokeless tobacco: Never Used  . Tobacco comment: smoking cessation info given and reviewed   Vaping Use  . Vaping Use: Never used  Substance and Sexual Activity  . Alcohol use: Not Currently    Alcohol/week: 0.0 standard drinks  . Drug use: No  . Sexual activity: Yes    Birth control/protection: Surgical  Other Topics Concern  . Not on file  Social History Narrative   Divorced since 37.Lives alone with a dog.Marland KitchenRetired.  She receives Meals on Wheels.   Social Determinants of Health    Financial Resource Strain: Low Risk   . Difficulty of Paying Living Expenses: Not hard at all  Food Insecurity: Not on file  Transportation Needs: Not on file  Physical Activity: Not on file  Stress: Not on file  Social Connections: Not on file  Intimate Partner Violence: Not on file   Family History:  Family History  Problem Relation Age of Onset  . Pulmonary embolism Mother   . Heart attack Father   . Hypertension Father   . Liver cancer Sister   . Cancer Sister   . Cancer Brother   . Cancer Daughter   . Breast cancer Daughter   . Colon cancer Neg Hx     Review of Systems: Constitutional: Doesn't report fevers, chills or abnormal weight loss Eyes: Doesn't report blurriness of vision Ears, nose, mouth, throat, and face: Doesn't report sore throat Respiratory: Doesn't report cough, dyspnea or wheezes Cardiovascular: Doesn't report palpitation, chest discomfort  Gastrointestinal:  Doesn't report nausea, constipation, diarrhea GU: Doesn't report incontinence Skin: Doesn't report skin rashes Neurological: Per HPI Musculoskeletal: Doesn't report joint pain Behavioral/Psych: Doesn't report anxiety  Physical Exam: Vitals:   11/10/20 1223  BP: 104/66  Pulse: 71  Resp: 18  Temp: 97.7 F (36.5 C)  SpO2: 91%   KPS: 80. General: Alert, cooperative, pleasant, in no acute distress Head: Normal EENT: No conjunctival injection or scleral icterus.  Lungs: Resp effort normal Cardiac: Regular rate Abdomen: Non-distended abdomen Skin: No rashes cyanosis or petechiae. Extremities: No clubbing or edema  Neurologic Exam: Mental Status: Awake, alert, attentive to examiner. Oriented to self and environment. Language is fluent with intact comprehension.  Cranial Nerves: Visual acuity is grossly normal. Right homonymous hemianopia. Extra-ocular movements intact. No ptosis. Face is symmetric Motor: Tone and bulk are normal. Power is full in both arms and legs. Reflexes are  symmetric, no pathologic reflexes present.  Sensory: Intact to light touch Gait: Normal.   Labs: I have reviewed the data as listed    Component Value Date/Time   NA 131 (L) 10/19/2020 1020   K 4.7 10/19/2020 1020   CL 94 (L) 10/19/2020 1020  CO2 27 10/19/2020 1020   GLUCOSE 153 (H) 10/19/2020 1020   BUN 27 (H) 10/19/2020 1020   CREATININE 0.58 10/19/2020 1020   CREATININE 0.68 09/26/2020 1230   CREATININE 0.83 01/26/2020 1623   CALCIUM 9.6 10/19/2020 1020   PROT 7.3 10/19/2020 1020   ALBUMIN 4.0 10/19/2020 1020   AST 20 10/19/2020 1020   AST 17 09/26/2020 1230   ALT 19 10/19/2020 1020   ALT 21 09/26/2020 1230   ALKPHOS 52 10/19/2020 1020   BILITOT 0.4 10/19/2020 1020   BILITOT 0.3 09/26/2020 1230   GFRNONAA >60 10/19/2020 1020   GFRNONAA >60 09/26/2020 1230   GFRNONAA 68 01/26/2020 1623   GFRAA >60 06/20/2020 0949   GFRAA 78 01/26/2020 1623   Lab Results  Component Value Date   WBC 7.9 10/19/2020   NEUTROABS 7.4 10/19/2020   HGB 12.4 10/19/2020   HCT 38.6 10/19/2020   MCV 90.0 10/19/2020   PLT 265 10/19/2020    Imaging:  [facility] Clinician Interpretation: I have personally reviewed the CNS images as listed.  My interpretation, in the context of the patient's clinical presentation, is stable disease  CT Chest W Contrast  Result Date: 10/19/2020 CLINICAL DATA:  Lung cancer. EXAM: CT CHEST WITH CONTRAST TECHNIQUE: Multidetector CT imaging of the chest was performed during intravenous contrast administration. CONTRAST:  54mL OMNIPAQUE IOHEXOL 300 MG/ML  SOLN COMPARISON:  06/22/2020. FINDINGS: Cardiovascular: Atherosclerotic calcification of the aorta, aortic valve and coronary arteries. Heart is at the upper limits of normal in size to mildly enlarged. Small amount of anterior pericardial fluid appears similar. Mediastinum/Nodes: No pathologically enlarged mediastinal, hilar or axillary lymph nodes. Esophagus is grossly unremarkable. Lungs/Pleura: Centrilobular emphysema.  Postoperative scarring in the right upper and left lower lobes. Mild scarring in the lung bases. Lungs are otherwise clear. No pleural fluid. Airway is unremarkable. Upper Abdomen: Visualized portions of the liver and adrenal glands are unremarkable. A stone is seen in the right kidney. Visualized portions of the kidneys, spleen, pancreas, stomach and bowel are otherwise unremarkable. Musculoskeletal: Degenerative and postoperative changes in the spine. Multiple thoracolumbar compression deformities, unchanged. Old right rib fracture. No worrisome lytic or sclerotic lesions. IMPRESSION: 1. No evidence of recurrent or metastatic disease. 2. Right renal stone. 3. Aortic atherosclerosis (ICD10-I70.0). Coronary artery calcification. 4.  Emphysema (ICD10-J43.9). Electronically Signed   By: Leanna Battles M.D.   On: 10/19/2020 11:33   MR Brain W Wo Contrast  Result Date: 11/03/2020 CLINICAL DATA:  Metastatic lung cancer, follow-up EXAM: MRI HEAD WITHOUT AND WITH CONTRAST TECHNIQUE: Multiplanar, multiecho pulse sequences of the brain and surrounding structures were obtained without and with intravenous contrast. CONTRAST:  59mL MULTIHANCE GADOBENATE DIMEGLUMINE 529 MG/ML IV SOLN COMPARISON:  Most recent prior 08/05/2020 FINDINGS: Motion artifact is present. Brain: Left parieto-occipital resection cavity again identified with similar surrounding enhancement. Extent of adjacent T2 FLAIR hyperintensity is also unchanged. Small cerebellar lesions on prior study are no longer definitely identified. Small right frontal lesion is no longer identified. Left parasagittal frontal lesion is no longer identified with persistent susceptibility likely reflecting chronic blood products. Small right cerebellar developmental venous anomaly. Ventricles and sulci are stable in size and configuration. No hydrocephalus. Stable findings of probable chronic microvascular ischemic changes in the cerebral and pontine white matter. Vascular:  Major vessel flow voids at the skull base are preserved. Skull and upper cervical spine: Normal marrow signal is within normal limits. Sinuses/Orbits: Paranasal sinuses are aerated. Orbits are unremarkable. Other: Sella is unremarkable.  Mastoid air  cells are clear. IMPRESSION: Stable enhancement adjacent to left parieto-occipital resection cavity. There is otherwise no enhancement identified at the site of previously seen lesions. No new lesion identified. Electronically Signed   By: Macy Mis M.D.   On: 11/03/2020 14:22    Assessment/Plan Brain metastases ([place]) [C79.31]  Shatara S Ocain is clinically stable today.  MRI demonstrates resolution of several new metastases within the left cerebellar hemisphere, appreciated on last scan in [place].  Parieto-occipital lesion is stable as well, after progression last scan.  Overall we can attribute this positive response to Tagrisso, and encouraged continued use of this agent for CNS control, if tolerated.  Will con't to defer further radiosurgery, pending additional progressive changes.   We appreciate the opportunity to participate in the care of [name].   We ask that [name] return to clinic in 3 months following next brain MRI, or sooner as needed.  All questions were answered. The patient knows to call the clinic with any problems, questions or concerns. No barriers to learning were detected.  I have spent a total of 30 minutes of face-to-face and non-face-to-face time, excluding clinical staff time, preparing to see patient, ordering tests and/or medications, counseling the patient, and independently interpreting results and communicating results to the patient/family/caregiver   Ventura Sellers, MD Medical Director of Neuro-Oncology [facility] at [place] 11/10/20 12:09 PM

## 2020-11-11 ENCOUNTER — Other Ambulatory Visit: Payer: PPO

## 2020-11-11 ENCOUNTER — Encounter: Payer: Self-pay | Admitting: Family Medicine

## 2020-11-11 DIAGNOSIS — R300 Dysuria: Secondary | ICD-10-CM | POA: Diagnosis not present

## 2020-11-12 ENCOUNTER — Other Ambulatory Visit: Payer: Self-pay | Admitting: Family Medicine

## 2020-11-12 LAB — URINE CULTURE
MICRO NUMBER:: 11497021
SPECIMEN QUALITY:: ADEQUATE

## 2020-11-14 ENCOUNTER — Encounter: Payer: Self-pay | Admitting: Family Medicine

## 2020-11-14 MED ORDER — FLUTICASONE PROPIONATE 50 MCG/ACT NA SUSP: 2.0000 | Freq: Every day | NASAL | 0 refills | Status: DC

## 2020-11-15 ENCOUNTER — Ambulatory Visit (INDEPENDENT_AMBULATORY_CARE_PROVIDER_SITE_OTHER): Payer: PPO | Admitting: Pharmacist

## 2020-11-15 ENCOUNTER — Other Ambulatory Visit: Payer: Self-pay | Admitting: Hematology and Oncology

## 2020-11-15 DIAGNOSIS — J449 Chronic obstructive pulmonary disease, unspecified: Secondary | ICD-10-CM

## 2020-11-15 DIAGNOSIS — C7931 Secondary malignant neoplasm of brain: Secondary | ICD-10-CM

## 2020-11-15 DIAGNOSIS — I10 Essential (primary) hypertension: Secondary | ICD-10-CM

## 2020-11-15 DIAGNOSIS — E785 Hyperlipidemia, unspecified: Secondary | ICD-10-CM

## 2020-11-15 NOTE — Patient Instructions (Addendum)
Visit Information  Goals Addressed            This Visit's Progress   . Manage Fatigue (Tiredness- Cancer Treatment)       Follow Up Date 01/05/21   - eat healthy - get outdoors every day (weather permitting) - use devices that will help like a cane, sock-puller or reacher    Why is this important?   Cancer treatment and its side effects can drain your energy. It can keep you from doing things you would like to do.  There are many things that you can do to manage fatigue.    Notes:       Patient Care Plan: General Pharmacy (Adult)    Problem Identified: HTN. Adenocarcinoma of lung w/ brain metastases, COPD, HLD   Priority: High  Onset Date: 11/15/2020    Long-Range Goal: Patient-Specific Goal   Start Date: 11/15/2020  Expected End Date: 05/15/2021  This Visit's Progress: On track  Priority: High  Note:   Current Barriers:  . No true barriers identified at this visit   Pharmacist Clinical Goal(s):  Marland Kitchen Over the next 120 days, patient will work toward alleviating adverse effects from treatment through collaboration with PharmD and provider.   Interventions: . 1:1 collaboration with [name], MD regarding development and update of comprehensive plan of care as evidenced by provider attestation and co-signature . Inter-disciplinary care team collaboration (see longitudinal plan of care) . Comprehensive medication review performed; medication list updated in electronic medical record  Hypertension (BP goal <140/90) -controlled -Current treatment: . No medications at this time -Medications previously tried: none -Current home readings: none available  -Denies hypotensive/hypertensive symptoms -Educated on Importance of home blood pressure monitoring; Symptoms of hypotension and importance of maintaining adequate hydration; -Counseled to monitor BP at home periodically, document, and provide log at future appointments -Counseled on diet and exercise extensively  -  Continue current management strategy  Hyperlipidemia: (LDL goal < 100) -uncontrolled -Current treatment: . No medications -Medications previously tried: pravastatin, atorvastatin (unkown why d/c)  -Educated on Cholesterol goals;  -Counseled on diet and exercise extensively recommend to continue current management strategy, would hesitate to recommend statin given her age and other comonbidities  COPD (Goal: control symptoms and prevent exacerbations) -controlled -Current treatment  . Trelegy 100-62.5-25 mcg one puff daily . Albuterol HFA 90 mcg -Medications previously tried: none noted   -Exacerbations requiring treatment in last 6 months:  none -Patient reports consistent use of maintenance inhaler -Frequency of rescue inhaler use: rarely -Counseled on Proper inhaler technique; Differences between maintenance and rescue inhalers -Recommended to continue current medication Assessed frequency of SOB episodes and inhaler usage  Lung Cancer w/ brain metastases (Goal: Comfort care, minimize symptoms and adverse effects from treatment) -controlled -Current treatment  . Tagrisso 80mg   -Medications previously tried: none noted  -Counseled on use of O'Keefes working hands cream for patient having extreme dry hands and fingertips as a result of new treatment with Tagrisso  - Lesions on brain have cleared - Recommend continue current medication   Patient Goals/Self-Care Activities . Over the next 120 days, patient will:  - take medications as prescribed Try O'Keefes working hand cream and report any increasing or worsening adverse effects to providers  Follow Up Plan: The care management team will reach out to the patient again over the next 120 days.       The patient verbalized understanding of instructions, educational materials, and care plan provided today and agreed to receive a  mailed copy of patient instructions, educational materials, and care plan.   Telephone follow  up appointment with pharmacy team member scheduled for: 4 months  Edythe Clarity, [facility]  Hypertension, Adult High blood pressure (hypertension) is when the force of blood pumping through the arteries is too strong. The arteries are the blood vessels that carry blood from the heart throughout the body. Hypertension forces the heart to work harder to pump blood and may cause arteries to become narrow or stiff. Untreated or uncontrolled hypertension can cause a heart attack, heart failure, a stroke, kidney disease, and other problems. A blood pressure reading consists of a higher number over a lower number. Ideally, your blood pressure should be below 120/80. The first ("top") number is called the systolic pressure. It is a measure of the pressure in your arteries as your heart beats. The second ("bottom") number is called the diastolic pressure. It is a measure of the pressure in your arteries as the heart relaxes. What are the causes? The exact cause of this condition is not known. There are some conditions that result in or are related to high blood pressure. What increases the risk? Some risk factors for high blood pressure are under your control. The following factors may make you more likely to develop this condition:  Smoking.  Having type 2 diabetes mellitus, high cholesterol, or both.  Not getting enough exercise or physical activity.  Being overweight.  Having too much fat, sugar, calories, or salt (sodium) in your diet.  Drinking too much alcohol. Some risk factors for high blood pressure may be difficult or impossible to change. Some of these factors include:  Having chronic kidney disease.  Having a family history of high blood pressure.  Age. Risk increases with age.  Race. You may be at higher risk if you are African American.  Gender. Men are at higher risk than women before age 80. After age 73, women are at higher risk than men.  Having obstructive sleep  apnea.  Stress. What are the signs or symptoms? High blood pressure may not cause symptoms. Very high blood pressure (hypertensive crisis) may cause:  Headache.  Anxiety.  Shortness of breath.  Nosebleed.  Nausea and vomiting.  Vision changes.  Severe chest pain.  Seizures. How is this diagnosed? This condition is diagnosed by measuring your blood pressure while you are seated, with your arm resting on a flat surface, your legs uncrossed, and your feet flat on the floor. The cuff of the blood pressure monitor will be placed directly against the skin of your upper arm at the level of your heart. It should be measured at least twice using the same arm. Certain conditions can cause a difference in blood pressure between your right and left arms. Certain factors can cause blood pressure readings to be lower or higher than normal for a short period of time:  When your blood pressure is higher when you are in a health care provider's office than when you are at home, this is called white coat hypertension. Most people with this condition do not need medicines.  When your blood pressure is higher at home than when you are in a health care provider's office, this is called masked hypertension. Most people with this condition may need medicines to control blood pressure. If you have a high blood pressure reading during one visit or you have normal blood pressure with other risk factors, you may be asked to:  Return on a different day  to have your blood pressure checked again.  Monitor your blood pressure at home for 1 week or longer. If you are diagnosed with hypertension, you may have other blood or imaging tests to help your health care provider understand your overall risk for other conditions. How is this treated? This condition is treated by making healthy lifestyle changes, such as eating healthy foods, exercising more, and reducing your alcohol intake. Your health care provider may  prescribe medicine if lifestyle changes are not enough to get your blood pressure under control, and if:  Your systolic blood pressure is above 130.  Your diastolic blood pressure is above 80. Your personal target blood pressure may vary depending on your medical conditions, your age, and other factors. Follow these instructions at home: Eating and drinking  Eat a diet that is high in fiber and potassium, and low in sodium, added sugar, and fat. An example eating plan is called the DASH (Dietary Approaches to Stop Hypertension) diet. To eat this way: ? Eat plenty of fresh fruits and vegetables. Try to fill one half of your plate at each meal with fruits and vegetables. ? Eat whole grains, such as whole-wheat pasta, brown rice, or whole-grain bread. Fill about one fourth of your plate with whole grains. ? Eat or drink low-fat dairy products, such as skim milk or low-fat yogurt. ? Avoid fatty cuts of meat, processed or cured meats, and poultry with skin. Fill about one fourth of your plate with lean proteins, such as fish, chicken without skin, beans, eggs, or tofu. ? Avoid pre-made and processed foods. These tend to be higher in sodium, added sugar, and fat.  Reduce your daily sodium intake. Most people with hypertension should eat less than 1,500 mg of sodium a day.  Do not drink alcohol if: ? Your health care provider tells you not to drink. ? You are pregnant, may be pregnant, or are planning to become pregnant.  If you drink alcohol: ? Limit how much you use to:  0-1 drink a day for women.  0-2 drinks a day for men. ? Be aware of how much alcohol is in your drink. In the [place] one drink equals one 12 oz bottle of beer (355 mL), one 5 oz glass of wine (148 mL), or one 1 oz glass of hard liquor (44 mL).   Lifestyle  Work with your health care provider to maintain a healthy body weight or to lose weight. Ask what an ideal weight is for you.  Get at least 30 minutes of exercise most  days of the week. Activities may include walking, swimming, or biking.  Include exercise to strengthen your muscles (resistance exercise), such as Pilates or lifting weights, as part of your weekly exercise routine. Try to do these types of exercises for 30 minutes at least 3 days a week.  Do not use any products that contain nicotine or tobacco, such as cigarettes, e-cigarettes, and chewing tobacco. If you need help quitting, ask your health care provider.  Monitor your blood pressure at home as told by your health care provider.  Keep all follow-up visits as told by your health care provider. This is important.   Medicines  Take over-the-counter and prescription medicines only as told by your health care provider. Follow directions carefully. Blood pressure medicines must be taken as prescribed.  Do not skip doses of blood pressure medicine. Doing this puts you at risk for problems and can make the medicine less effective.  Ask your  health care provider about side effects or reactions to medicines that you should watch for. Contact a health care provider if you:  Think you are having a reaction to a medicine you are taking.  Have headaches that keep coming back (recurring).  Feel dizzy.  Have swelling in your ankles.  Have trouble with your vision. Get help right away if you:  Develop a severe headache or confusion.  Have unusual weakness or numbness.  Feel faint.  Have severe pain in your chest or abdomen.  Vomit repeatedly.  Have trouble breathing. Summary  Hypertension is when the force of blood pumping through your arteries is too strong. If this condition is not controlled, it may put you at risk for serious complications.  Your personal target blood pressure may vary depending on your medical conditions, your age, and other factors. For most people, a normal blood pressure is less than 120/80.  Hypertension is treated with lifestyle changes, medicines, or a  combination of both. Lifestyle changes include losing weight, eating a healthy, low-sodium diet, exercising more, and limiting alcohol. This information is not intended to replace advice given to you by your health care provider. Make sure you discuss any questions you have with your health care provider. Document Revised: 06/04/2018 Document Reviewed: 06/04/2018 [employer]vier Patient Education  2021 [employer].

## 2020-11-15 NOTE — Progress Notes (Signed)
Chronic Care Management Pharmacy Note  11/15/2020 Name:  [name] MRN:  [id number] DOB:  18-Dec-1940  Subjective: [name] is an 80 y.o. year old female who is a primary patient of Pickard, Cammie Mcgee, MD.  The CCM team was consulted for assistance with disease management and care coordination needs.    Engaged with patient by telephone for follow up visit in response to provider referral for pharmacy case management and/or care coordination services.   Consent to Services:  The patient was given the following information about Chronic Care Management services today, agreed to services, and gave verbal consent: 1. CCM service includes personalized support from designated clinical staff supervised by the primary care provider, including individualized plan of care and coordination with other care providers 2. 24/7 contact phone numbers for assistance for urgent and routine care needs. 3. Service will only be billed when office clinical staff spend 20 minutes or more in a month to coordinate care. 4. Only one practitioner may furnish and bill the service in a calendar month. 5.The patient may stop CCM services at any time (effective at the end of the month) by phone call to the office staff. 6. The patient will be responsible for cost sharing (co-pay) of up to 20% of the service fee (after annual deductible is met). Patient agreed to services and consent obtained.  Patient Care Team: [name], MD as PCP - General (Family Medicine) Herminio Commons, MD (Inactive) as PCP - Cardiology (Cardiology) Danie Binder, MD (Inactive) (Gastroenterology) Nicanor Alcon, MD (Thoracic Surgery) Everardo All, MD (Hematology and Oncology) Edythe Clarity, [facility] as Pharmacist (Pharmacist)  Recent office visits: None recently  Recent consult visits: 11/10/20 Mickeal Skinner, oncology) - mri shows resolution of several metastases, no medication changes regular follow up  Objective:  Lab  Results  Component Value Date   CREATININE 0.58 10/19/2020   BUN 27 (H) 10/19/2020   GFR 107.85 02/16/2016   GFRNONAA >60 10/19/2020   GFRAA >60 06/20/2020   NA 131 (L) 10/19/2020   K 4.7 10/19/2020   CALCIUM 9.6 10/19/2020   CO2 27 10/19/2020    Lab Results  Component Value Date/Time   HGBA1C 5.1 03/08/2019 04:26 AM   GFR 107.85 02/16/2016 11:50 AM    Last diabetic Eye exam: No results found for: HMDIABEYEEXA  Last diabetic Foot exam: No results found for: HMDIABFOOTEX   Lab Results  Component Value Date   CHOL 220 (H) 03/08/2019   HDL 54 03/08/2019   LDLCALC 155 (H) 03/08/2019   TRIG 57 03/08/2019   CHOLHDL 4.1 03/08/2019    Hepatic Function Latest Ref Rng & Units 10/19/2020 09/26/2020 08/29/2020  Total Protein 6.5 - 8.1 g/dL 7.3 6.9 6.7  Albumin 3.5 - 5.0 g/dL 4.0 3.8 3.9  AST 15 - 41 U/L _0 ALT 0 - 44 U/L _1 Alk Phosphatase 38 - 126 U/L 52 62 56  Total Bilirubin 0.3 - 1.2 mg/dL 0.4 0.3 0.3    Lab Results  Component Value Date/Time   TSH 2.827 03/15/2020 10:04 AM   TSH 4.09 01/26/2020 04:23 PM   TSH 1.20 11/27/2019 02:48 PM   FREET4 0.97 06/01/2015 08:23 AM    CBC Latest Ref Rng & Units 10/19/2020 09/26/2020 08/29/2020  WBC 4.0 - 10.5 K/uL 7.9 5.6 6.3  Hemoglobin 12.0 - 15.0 g/dL 12.4 11.9(L) 12.8  Hematocrit 36.0 - 46.0 % 38.6 35.7(L) 37.9  Platelets 150 - 400 K/uL 265  225 308    No results found for: VD25OH  Clinical ASCVD: No  The 10-year ASCVD risk score Mikey Bussing DC Jr., et al., 2013) is: 16.6%   Values used to calculate the score:     Age: 37 years     Sex: Female     Is Non-Hispanic African American: No     Diabetic: No     Tobacco smoker: No     Systolic Blood Pressure: 161 mmHg     Is BP treated: No     HDL Cholesterol: 54 mg/dL     Total Cholesterol: 220 mg/dL    Depression screen PHQ 2/9 04/27/2019  Decreased Interest 0  Down, Depressed, Hopeless 0  PHQ - 2 Score 0  Some recent data might be hidden       Social History    Tobacco Use  Smoking Status Former Smoker  . Packs/day: 0.50  . Years: 20.00  . Pack years: 10.00  . Types: Cigarettes  . Quit date: 12/21/2010  . Years since quitting: 9.9  Smokeless Tobacco Never Used  Tobacco Comment   smoking cessation info given and reviewed    BP Readings from Last 3 Encounters:  11/10/20 104/66  09/26/20 106/80  08/29/20 108/78   Pulse Readings from Last 3 Encounters:  11/10/20 71  09/26/20 72  08/29/20 80   Wt Readings from Last 3 Encounters:  11/10/20 111 lb 4.8 oz (50.5 kg)  09/26/20 113 lb 8 oz (51.5 kg)  08/29/20 114 lb 14.4 oz (52.1 kg)    Assessment/Interventions: Review of patient past medical history, allergies, medications, health status, including review of consultants reports, laboratory and other test data, was performed as part of comprehensive evaluation and provision of chronic care management services.   SDOH:  (Social Determinants of Health) assessments and interventions performed: No   CCM Care Plan  Allergies  Allergen Reactions  . Ciprofloxacin Hcl Hives  . Sulfonamide Derivatives Hives and Rash  . Iohexol Other (See Comments)    UNSPECIFIED REACTION  Desc: Per [facility], pt is allergic to IV contrast, no type of reaction was available. Pt cannot remember but first reacted around 15 yrs ago from an IVP. Notes were date from 2009 at urology center., Onset Date: [id number]   . Iodinated Diagnostic Agents Rash and Itching  . Prozac [Fluoxetine Hcl] Rash    Medications Reviewed Today    Reviewed by Edythe Clarity, [facility] (Pharmacist) on 11/15/20 at [place] List Status: <None>  Medication Order Taking? Sig Documenting Provider Last Dose Status Informant  albuterol (VENTOLIN HFA) 108 (90 Base) MCG/ACT inhaler [id number] Yes Inhale 2 puffs into the lungs every 6 (six) hours as needed for wheezing or shortness of breath. [name], MD Taking Active Self  ALPRAZolam Duanne Moron) 0.5 MG tablet [id number] Yes TAKE (1)  TABLET BY MOUTH TWICE A DAY AS NEEDED. [name], MD Taking Active   cephALEXin [facility]) 500 MG capsule [id number] Yes Take 1 capsule (500 mg total) by mouth 3 (three) times daily. [name], MD Taking Active   citalopram (CELEXA) 40 MG tablet [id number] Yes TAKE (1/2) TABLET BY MOUTH AT BEDTIME. [name], MD Taking Active   denosumab (PROLIA) 60 MG/ML SOSY injection [id number] Yes Inject 60 mg into the skin every 6 (six) months. [name], MD Taking Active   diphenhydrAMINE (BENADRYL) 50 MG tablet [id number]  Take 1 tablet (50 mg total) by mouth once for 1 dose. Take one hour prior  to CT scan Derek Jack, MD  Expired 06/20/20 2359   ferrous sulfate 325 (65 FE) MG tablet [id number] Yes Take 325 mg by mouth daily with breakfast. [provider] Taking Active Self  fluticasone (FLONASE) 50 MCG/ACT nasal spray [id number] Yes Place 2 sprays into both nostrils daily. [name], MD Taking Active   gabapentin (NEURONTIN) 100 MG capsule [id number] Yes Take 1 capsule (100 mg total) by mouth 3 (three) times daily. [name], MD Taking Active   levocetirizine (XYZAL) 5 MG tablet [id number] Yes TAKE ONE TABLET BY MOUTH ONCE DAILY. [name], MD Taking Active   levothyroxine (SYNTHROID) 50 MCG tablet [id number] Yes TAKE 1 TABLET BEFORE BREAKFAST. [name], MD Taking Active   Multiple Vitamin (MULTIVITAMIN WITH MINERALS) TABS tablet [id number] Yes Take 1 tablet by mouth daily. [provider] Taking Active Self  ondansetron (ZOFRAN) 4 MG tablet [id number] Yes TAKE 1 TABLET BY MOUTH 3 TIMES DAILY AS NEEDED FOR NAUSEA AND VOMITING. [name], MD Taking Active   OVER THE COUNTER MEDICATION [id number] Yes Bone Support 2-4 tabs daily [provider] Taking Active Self  OXcarbazepine (TRILEPTAL) 150 MG tablet [id number] Yes TAKE (1) TABLET BY MOUTH TWICE DAILY. [name], MD Taking Active   pantoprazole (PROTONIX) 40  MG tablet [id number] Yes TAKE (1) TABLET BY MOUTH ONCE DAILY BEFORE BREAKFAST. Annitta Needs, NP Taking Active   predniSONE (DELTASONE) 50 MG tablet [id number] Yes Please take one tablet at 13 hours, 7 hours and one hour prior to CT scan Orson Slick, MD Taking Active   Probiotic Product (ALIGN PO) [id number] Yes Take by mouth daily. [provider] Taking Active   TAGRISSO 80 MG tablet [id number] Yes TAKE 1 TABLET (80 MG TOTAL) BY MOUTH DAILY. Orson Slick, MD Taking Active   TRELEGY ELLIPTA 100-62.5-25 MCG/INH AEPB [id number] Yes INHALE 1 PUFF INTO LUNGS DAILY. [name], MD Taking Active   Med List Note Britt Boozer, [facility] 08/31/20 1015): Tagrisso filled through [employer] [phone] As of 03/13/2019           Patient Active Problem List   Diagnosis Date Noted  . Abdominal bloating 01/13/2020  . Primary cancer of left lower lobe of lung ([place]) 12/29/2019  . Flatulence 11/17/2019  . IBS (irritable bowel syndrome)   . Acute hypoxemic respiratory failure ([place]) 05/25/2019  . Pharyngeal dysphagia 05/07/2019  . Brain metastasis ([place]) 04/17/2019  . Brain tumor ([place]) 03/18/2019  . Essential hypertension 03/12/2019  . Brain metastases ([place]) 03/06/2019  . Cytotoxic brain edema ([place]) 03/04/2019  . ICH (intracerebral hemorrhage) (HCC) - L parietal lobe 03/03/2019  . Nontraumatic intracerebral hemorrhage, unspecified ([place]) 03/03/2019  . Osteoporosis 05/16/2018  . S/P ORIF (open reduction internal fixation) fracture right patella 03/18/18 03/26/2018  . Other specified postprocedural states 03/26/2018  . Cellulitis, wound, post-operative 03/22/2018  . Fracture of right patella 03/18/2018  . Normocytic anemia   . Esophageal dysphagia 11/27/2017  . Heme positive stool 11/27/2017  . Nasal crusting 02/06/2017  . Iron deficiency anemia 12/05/2016  . SIADH (syndrome of inappropriate ADH production) ([place])   . Polypharmacy 09/14/2016  . Muscle weakness (generalized)    . Diastolic dysfunction 96/78/9381  . Syncope 09/10/2016  . Rib pain on left side   . Hypersomnia 02/16/2016  . Acute bronchitis 02/16/2016  . Exertional dyspnea 12/20/2015  . HLD (hyperlipidemia) 11/23/2014  . Change in bowel habits 11/10/2014  . Abdominal pain, left lower  quadrant 11/10/2014  . Emphysema lung ([place]) 11/08/2014  . Helicobacter pylori gastritis 07/30/2014  . Acute on chronic respiratory failure with hypoxia ([place]) 04/10/2014  . Wrist fracture 04/09/2014  . 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 T12 burst fracture ([place]) 01/13/2014  . Constipation 12/14/2013  . Acute respiratory failure with hypoxia ([place]) 12/14/2013  . COPD (chronic obstructive pulmonary disease) ([place]) 12/14/2013  . Compression fracture of thoracic vertebra ([place]) 12/14/2013  . Hypoxia 12/13/2013  . Compression fracture 12/13/2013  . 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[place]) 08/11/2013  . AAA (abdominal aortic aneurysm) without rupture ([place]) 02/26/2012  . Dyspepsia 02/11/2012  . Aneurysm ([place]) 02/11/2012  . Trigeminal neuralgia   . Epigastric pain 02/21/2011  . Dysphagia 02/21/2011  . Adenocarcinoma of lung ([place]) 12/07/2010  . HELICOBACTER PYLORI GASTRITIS, HX OF 12/30/2009  . Radiographic dye allergy status 12/30/2009  . Personal history of other diseases of the digestive system 12/30/2009    Immunization History  Administered Date(s) Administered  . Fluad Quad(high Dose 65+) 06/19/2019, 07/20/2020  . Hepatitis B 02/17/2007, 04/17/2007, 09/12/2007  . Influenza, High Dose Seasonal PF 07/11/2018  . Influenza,inj,Quad PF,6+ Mos 07/12/2015, 07/12/2016, 06/11/2017  . Pneumococcal Conjugate-13 11/27/2019  . Pneumococcal Polysaccharide-23 07/19/2014  . Td 02/17/2007  . Tdap 04/09/2014  . Zoster 01/12/2009    Conditions to be addressed/monitored:  HTN. Adenocarcinoma of lung w/ brain metastases, COPD, HLD.  Care Plan : General Pharmacy (Adult)  Updates  made by Edythe Clarity, RPH since 11/15/2020 12:00 AM    Problem: HTN. Adenocarcinoma of lung w/ brain metastases, COPD, HLD   Priority: High  Onset Date: 11/15/2020    Long-Range Goal: Patient-Specific Goal   Start Date: 11/15/2020  Expected End Date: 05/15/2021  This Visit's Progress: On track  Priority: High  Note:   Current Barriers:  . No true barriers identified at this visit   Pharmacist Clinical Goal(s):  Marland Kitchen Over the next 120 days, patient will work toward alleviating adverse effects from treatment through collaboration with PharmD and provider.   Interventions: . 1:1 collaboration with [name], MD regarding development and update of comprehensive plan of care as evidenced by provider attestation and co-signature . Inter-disciplinary care team collaboration (see longitudinal plan of care) . Comprehensive medication review performed; medication list updated in electronic medical record  Hypertension (BP goal <140/90) -controlled -Current treatment: . No medications at this time -Medications previously tried: none -Current home readings: none available  -Denies hypotensive/hypertensive symptoms -Educated on Importance of home blood pressure monitoring; Symptoms of hypotension and importance of maintaining adequate hydration; -Counseled to monitor BP at home periodically, document, and provide log at future appointments -Counseled on diet and exercise extensively  - Continue current management strategy  Hyperlipidemia: (LDL goal < 100) -uncontrolled -Current treatment: . No medications -Medications previously tried: pravastatin, atorvastatin (unkown why d/c)  -Educated on Cholesterol goals;  -Counseled on diet and exercise extensively recommend to continue current management strategy, would hesitate to recommend statin given her age and other comonbidities  COPD (Goal: control symptoms and prevent exacerbations) -controlled -Current treatment  . Trelegy  100-62.5-25 mcg one puff daily . Albuterol HFA 90 mcg -Medications previously tried: none noted   -Exacerbations requiring treatment in last 6 months:  none -Patient reports consistent use of maintenance inhaler -Frequency of rescue inhaler use: rarely -Counseled on Proper inhaler technique; Differences between maintenance and rescue inhalers -Recommended to continue current medication Assessed frequency of SOB episodes and inhaler usage  Lung Cancer w/ brain metastases (Goal: Comfort care, minimize symptoms and adverse effects from treatment) -controlled -Current treatment  . Tagrisso 39m  -Medications previously tried: none noted  -Counseled on use of O'Keefes working hands cream for patient having extreme dry hands and fingertips as a result of new treatment with Tagrisso  - Lesions on brain have cleared - Recommend continue current medication   Patient Goals/Self-Care Activities . Over the next 120 days, patient will:  - take medications as prescribed Try O'[place]efes working hand cream and report any increasing or worsening adverse effects to providers  Follow Up Plan: The care management team will reach out to the patient again over the next 120 days.        Medication Assistance: None required.  Patient affirms current coverage meets needs.  Patient's [facility] pharmacy is:  [facility] [place] [place]Phone: [phone]: [phone] [address] [address] [place]Phone: [phone]: [phone] Uses pill box? Yes Pt endorses 100% compliance  We discussed: Benefits of medication synchronization, packaging and delivery as well as enhanced pharmacist oversight with [employer]. Patient decided to: Continue current medication management strategy - Has been with [facility] for  years  Follow Up:  Patient agrees to Care Plan and Follow-up.  Plan: The patient has been provided with contact information for the care management team and has been advised to call with any health related questions or concerns.   CBeverly Milch PharmD Clinical Pharmacist [place]([phone]

## 2020-11-17 ENCOUNTER — Encounter: Payer: Self-pay | Admitting: Hematology and Oncology

## 2020-11-17 ENCOUNTER — Telehealth: Payer: Self-pay | Admitting: *Deleted

## 2020-11-17 MED FILL — TAGRISSO 80 MG TABLET: 80 | 30 days supply | Qty: 30 | Fill #0

## 2020-11-17 NOTE — Telephone Encounter (Signed)
Received vm message from patient asking for a return call.  TCT patient and spoke with her. She states that the skin on her hands and finger is getting much worse-blisters, peeling. They are very painful. She has been using Eucerin Cream with out much relief. Advised to continue this along with wearing cotton gloves so she doesn't snag the peeling skin on her clothing etc. Advised that I would discuss with Dr. [name] in the morning and call her back.tomorrow, 11/18/20  Pt voiced understanding. She is also c/o burning with urination. She had a urinalysis done and they was no evidence of a UTI.  Dr. Henrine Screws advise

## 2020-11-21 ENCOUNTER — Other Ambulatory Visit: Payer: Self-pay | Admitting: Hematology and Oncology

## 2020-11-21 ENCOUNTER — Other Ambulatory Visit: Payer: Self-pay

## 2020-11-21 ENCOUNTER — Inpatient Hospital Stay: Payer: PPO

## 2020-11-21 ENCOUNTER — Inpatient Hospital Stay (HOSPITAL_BASED_OUTPATIENT_CLINIC_OR_DEPARTMENT_OTHER): Payer: PPO | Admitting: Hematology and Oncology

## 2020-11-21 VITALS — BP 99/67 | HR 70 | Temp 97.9°F | Resp 19 | Ht 60.0 in | Wt 111.2 lb

## 2020-11-21 DIAGNOSIS — C7931 Secondary malignant neoplasm of brain: Secondary | ICD-10-CM

## 2020-11-21 DIAGNOSIS — C3492 Malignant neoplasm of unspecified part of left bronchus or lung: Secondary | ICD-10-CM | POA: Diagnosis not present

## 2020-11-21 DIAGNOSIS — C3432 Malignant neoplasm of lower lobe, left bronchus or lung: Secondary | ICD-10-CM | POA: Diagnosis not present

## 2020-11-21 LAB — CMP (CANCER CENTER ONLY)
ALT: 17 U/L (ref 0–44)
AST: 19 U/L (ref 15–41)
Albumin: 3.8 g/dL (ref 3.5–5.0)
Alkaline Phosphatase: 43 U/L (ref 38–126)
Anion gap: 3 — ABNORMAL LOW (ref 5–15)
BUN: 19 mg/dL (ref 8–23)
CO2: 30 mmol/L (ref 22–32)
Calcium: 9.5 mg/dL (ref 8.9–10.3)
Chloride: 101 mmol/L (ref 98–111)
Creatinine: 0.65 mg/dL (ref 0.44–1.00)
GFR, Estimated: >60 mL/min (ref >60)
Glucose, Bld: 82 mg/dL (ref 70–99)
Potassium: 4.7 mmol/L (ref 3.5–5.1)
Sodium: 134 mmol/L — ABNORMAL LOW (ref 135–145)
Total Bilirubin: 0.4 mg/dL (ref 0.3–1.2)
Total Protein: 6.4 g/dL — ABNORMAL LOW (ref 6.5–8.1)

## 2020-11-21 LAB — CBC WITH DIFFERENTIAL (CANCER CENTER ONLY)
Abs Immature Granulocytes: 0.01 10*3/uL (ref 0.00–0.07)
Basophils Absolute: 0 10*3/uL (ref 0.0–0.1)
Basophils Relative: 0 %
Eosinophils Absolute: 0.1 10*3/uL (ref 0.0–0.5)
Eosinophils Relative: 2 %
HCT: 33.2 % — ABNORMAL LOW (ref 36.0–46.0)
Hemoglobin: 10.8 g/dL — ABNORMAL LOW (ref 12.0–15.0)
Immature Granulocytes: 0 %
Lymphocytes Relative: 14 %
Lymphs Abs: 0.6 10*3/uL — ABNORMAL LOW (ref 0.7–4.0)
MCH: 29.8 pg (ref 26.0–34.0)
MCHC: 32.5 g/dL (ref 30.0–36.0)
MCV: 91.5 fL (ref 80.0–100.0)
Monocytes Absolute: 0.4 10*3/uL (ref 0.1–1.0)
Monocytes Relative: 9 %
Neutro Abs: 3.4 10*3/uL (ref 1.7–7.7)
Neutrophils Relative %: 75 %
Platelet Count: 209 10*3/uL (ref 150–400)
RBC: 3.63 MIL/uL — ABNORMAL LOW (ref 3.87–5.11)
RDW: 14.1 % (ref 11.5–15.5)
WBC Count: 4.6 10*3/uL (ref 4.0–10.5)
nRBC: 0 % (ref 0.0–0.2)

## 2020-11-21 LAB — TSH: TSH: 2.307 u[IU]/mL (ref 0.308–3.960)

## 2020-11-21 NOTE — Progress Notes (Signed)
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 0/14 lymph nodes.  No perineural invasion or LVI.  negative margins 2) 03/03/2019: presented to ED with expressive aphasia, found to have multiple brain metastasis and underwent SRS on 03/17/2019. 3) 03/18/2019: Stereotactic left parietal craniotomy for resection of tumor 4) 04/30/2020: Brain MRI on showed 2 new subcentimeter meta stasis in the right cerebellum and right frontal lobe. 5)  05/20/2020: Stereotactic radiation therapy was completed 6) 06/23/2020: establish care with Dr. [name]  7) 08/29/2020: prescribed osimirtinib therapy due to next metastatic disease in the brain.  8) 09/05/2020: started osimirtinib 18m PO daily 9) 10/19/2020: CT Chest showed no evidence of residual disease 10) 11/21/2020: desquamation and erythema of finger tips. Concern for HFS. Temporarily holding therapy   Interval History:  Ruth SPEAKMAN80y.o. female with medical history significant for metastatic adenocarcinoma of the lung with brain metastasis who presents for a follow up visit. The patient's last visit was on 10/24/2020 for a telephone visit. In the interim since the last visit the patient been compliant with her Tagrisso.   On exam today [name] reports has been having some issue with pain on her fingertips.  She notes this was going on for approximately 3 to 4 weeks.  There is some erythema at  the fingertips with some desquamation.  She is wearing some gloves and will try to protect her fingers but has not been using any cream as of yet.  She notes that she has increased water consumption which she believes has been helpful.  She notes that she does have some rare loose stools but otherwise has been quite well.  She is not having any other major symptoms as a result of her therapy.  She currently denies any fevers, chills, sweats, nausea vomiting or diarrhea.  A full ten point ROS is listed below.  MEDICAL HISTORY:  Past Medical History:  Diagnosis Date  . Adenocarcinoma of lung ([place] 12/2010   left lung/surg only  . Allergic rhinitis   . 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[facility]    corrected by surgery 08/21/13  . Anxiety disorder   . Aortic aneurysm ([facility]   . Brain metastasis ([facility] [place]    lung cancer s/p resection 7/20  . Cancer ([facility]    Phreesia 05/26/2020  . Complication of anesthesia 2011   bloodpressure dropped during colonoscopy, not problems since  . Compression fracture   . COPD (chronic obstructive pulmonary disease) ([facility]   . Depression   . Diastolic dysfunction   . Diverticulosis   . Dysphagia   . Emphysema lung ([facility] 11/08/2014  . Headache   . Hiatal hernia   . History of kidney stones   . Hypothyroidism   . IBS (irritable bowel syndrome)   . Iron deficiency anemia due to chronic blood loss 12/05/2016  . On home O2 12/15/2013   chronic hypoxia  . Pneumonia   . PUD (peptic ulcer disease)    89yr . Shortness of breath    with exertion  . SIADH (syndrome  of inappropriate ADH production) ([place])   . Trigeminal neuralgia     SURGICAL HISTORY: Past Surgical History:  Procedure Laterality Date  . ABDOMINAL HYSTERECTOMY    . APPLICATION OF CRANIAL NAVIGATION N/A 03/18/2019   Procedure: APPLICATION OF CRANIAL NAVIGATION;  Surgeon: Consuella Lose, MD;  Location: [place];  Service: Neurosurgery;  Laterality: N/A;  . BACK SURGERY  January 13, 2014  . BRAIN SURGERY N/A     Phreesia 03/28/2020  . CATARACT EXTRACTION    . CATARACT EXTRACTION W/PHACO  09/02/2012   Procedure: CATARACT EXTRACTION PHACO AND INTRAOCULAR LENS PLACEMENT (IOC);  Surgeon: Elta Guadeloupe T. Gershon Crane, MD;  Location: [facility];  Service: Ophthalmology;  Laterality: Left;  CDE:10.35  . CHOLECYSTECTOMY    . 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 COLONOSCOPY N/A 11/23/2014   Dr. Oneida Alar: moderate diverticulosis, hemorrhoids, redundant colon. next TCS in 10-15 years.   . CRANIOTOMY Left 03/18/2019   Procedure: Left stereotactic craniotomy for tumor resection;  Surgeon: Consuella Lose, MD;  Location: [place];  Service: Neurosurgery;  Laterality: Left;  Left stereotactic craniotomy for tumor resection  . ENDARTERECTOMY Right 08/21/2013   Procedure: RIGHT CAROTID ANEURYSM RESECTION;  Surgeon: Rosetta Posner, MD;  Location: [place];  Service: Vascular;  Laterality: Right;  . ESOPHAGEAL DILATION  02/24/2018   Procedure: ESOPHAGEAL DILATION;  Surgeon: Danie Binder, MD;  [facility];  Service: Endoscopy;;  . ESOPHAGOGASTRODUODENOSCOPY  11/13/2009   w/dilation to 85m, gastric ulceration (H.Pylori) s/p treatment  . ESOPHAGOGASTRODUODENOSCOPY  11/21/2009   distal esophageal web, gastritis  . ESOPHAGOGASTRODUODENOSCOPY  03/14/12   SJFH:LKTGYBWLSin the distal esophagus/Mild gastritis/small HH. + H.pylori, prescribed Pylera. Finished treatment.   . ESOPHAGOGASTRODUODENOSCOPY N/A 12/13/2017   Dr. FOneida Alar esophageal web s/p dilation, gastritis, no h.pylori  . ESOPHAGOGASTRODUODENOSCOPY N/A 02/24/2018   Dr. FOneida Alar web in prox esophagus and in distal esophagus s/p dilation, mild gastritis, mild pyloric stenosis, mild post ulcer duodenal deformity at D1/D2  . fatty tumor removal from lt groin    . FRACTURE SURGERY Left    hand and left ring finger  . GIVENS CAPSULE STUDY N/A 12/26/2017   incomplete  . GIVENS CAPSULE STUDY N/A 02/24/2018   normal small bowel  . KNEE SURGERY      patella tendon repair june 2019 [place]  . LUNG CANCER SURGERY  12/2010   Left VATS, minithoracotomy, LLL superior segmentectomy  . ORIF PATELLA Right 03/18/2018   Procedure: OPEN REDUCTION INTERNAL (ORIF) FIXATION RIGHT PATELLA;  Surgeon: HCarole Civil MD;  [facility];  Service: Orthopedics;  Laterality: Right;  . ORIF WRIST FRACTURE Left 04/09/2014   Procedure: OPEN REDUCTION INTERNAL FIXATION (ORIF) WRIST FRACTURE;  Surgeon: TRenette Butters MD;  [place]  Service: Orthopedics;  Laterality: Left;  . PERCUTANEOUS PINNING Left 04/09/2014   Procedure: PERCUTANEOUS PINNING EXTREMITY;  Surgeon: TRenette Butters MD;  [place]  Service: Orthopedics;  Laterality: Left;  . SAVORY DILATION N/A 12/13/2017   Procedure: SAVORY DILATION;  Surgeon: FDanie Binder MD;  [facility];  Service: Endoscopy;  Laterality: N/A;  . TUBAL LIGATION    . vocal cord surgery  02/06/2011   laryngoscopy with bilateral vocal cord Radiesse injection for vocal cord paralysis  . YAG LASER APPLICATION Left [id number]  Procedure: YAG LASER APPLICATION;  Surgeon: MRutherford Guys MD;  [facility];  Service: Ophthalmology;  Laterality: Left;    SOCIAL HISTORY: Social History  Socioeconomic History  . Marital status: Divorced    Spouse name: Not on file  . Number of children: Not on file  . Years of education: Not on file  . Highest education level: Not on file  Occupational History  . Not on file  Tobacco Use  . Smoking status: Former Smoker    Packs/day: 0.50    Years: 20.00    Pack years: 10.00    Types: Cigarettes    Quit date: 12/21/2010    Years since quitting: 9.9  . Smokeless tobacco: Never Used  . Tobacco comment: smoking cessation info given and reviewed   Vaping Use  . Vaping Use: Never used  Substance and Sexual Activity  . Alcohol use: Not Currently    Alcohol/week: 0.0 standard drinks  . Drug use: No  . Sexual activity: Yes    Birth control/protection:  Surgical  Other Topics Concern  . Not on file  Social History Narrative   Divorced since 33.Lives alone with a dog.Marland KitchenRetired.  She receives Meals on Wheels.   Social Determinants of Health   Financial Resource Strain: Low Risk   . Difficulty of Paying Living Expenses: Not hard at all  Food Insecurity: Not on file  Transportation Needs: Not on file  Physical Activity: Not on file  Stress: Not on file  Social Connections: Not on file  Intimate Partner Violence: Not on file    FAMILY HISTORY: Family History  Problem Relation Age of Onset  . Pulmonary embolism Mother   . Heart attack Father   . Hypertension Father   . Liver cancer Sister   . Cancer Sister   . Cancer Brother   . Cancer Daughter   . Breast cancer Daughter   . Colon cancer Neg Hx     ALLERGIES:  is allergic to ciprofloxacin hcl, sulfonamide derivatives, iohexol, iodinated diagnostic agents, and prozac [fluoxetine hcl].  MEDICATIONS:  Current Outpatient Medications  Medication Sig Dispense Refill  . albuterol (VENTOLIN HFA) 108 (90 Base) MCG/ACT inhaler Inhale 2 puffs into the lungs every 6 (six) hours as needed for wheezing or shortness of breath. 18 g 2  . ALPRAZolam (XANAX) 0.5 MG tablet TAKE (1) TABLET BY MOUTH TWICE A DAY AS NEEDED. 60 tablet 0  . cephALEXin (KEFLEX) 500 MG capsule Take 1 capsule (500 mg total) by mouth 3 (three) times daily. 21 capsule 0  . citalopram (CELEXA) 40 MG tablet TAKE (1/2) TABLET BY MOUTH AT BEDTIME. 45 tablet 5  . denosumab (PROLIA) 60 MG/ML SOSY injection Inject 60 mg into the skin every 6 (six) months. 1 mL 0  . diphenhydrAMINE (BENADRYL) 50 MG tablet Take 1 tablet (50 mg total) by mouth once for 1 dose. Take one hour prior to CT scan 1 tablet 0  . ferrous sulfate 325 (65 FE) MG tablet Take 325 mg by mouth daily with breakfast.    . fluticasone (FLONASE) 50 MCG/ACT nasal spray Place 2 sprays into both nostrils daily. 16 g 0  . gabapentin (NEURONTIN) 100 MG capsule Take 1  capsule (100 mg total) by mouth 3 (three) times daily. 90 capsule 3  . levocetirizine (XYZAL) 5 MG tablet TAKE ONE TABLET BY MOUTH ONCE DAILY. 30 tablet 0  . levothyroxine (SYNTHROID) 50 MCG tablet TAKE 1 TABLET BEFORE BREAKFAST. 90 tablet 0  . Multiple Vitamin (MULTIVITAMIN WITH MINERALS) TABS tablet Take 1 tablet by mouth daily.    . ondansetron (ZOFRAN) 4 MG tablet TAKE 1 TABLET BY MOUTH 3 TIMES DAILY AS  NEEDED FOR NAUSEA AND VOMITING. 90 tablet 0  . OVER THE COUNTER MEDICATION Bone Support 2-4 tabs daily    . OXcarbazepine (TRILEPTAL) 150 MG tablet TAKE (1) TABLET BY MOUTH TWICE DAILY. 60 tablet 2  . pantoprazole (PROTONIX) 40 MG tablet TAKE (1) TABLET BY MOUTH ONCE DAILY BEFORE BREAKFAST. 90 tablet 3  . predniSONE (DELTASONE) 50 MG tablet Please take one tablet at 13 hours, 7 hours and one hour prior to CT scan 3 tablet 0  . Probiotic Product (ALIGN PO) Take by mouth daily.    Marland Kitchen TAGRISSO 80 MG tablet TAKE 1 TABLET (80 MG TOTAL) BY MOUTH DAILY. 30 tablet 2  . TRELEGY ELLIPTA 100-62.5-25 MCG/INH AEPB INHALE 1 PUFF INTO LUNGS DAILY. 60 each 0   No current facility-administered medications for this visit.    REVIEW OF SYSTEMS:   Constitutional: ( - ) fevers, ( - )  chills , ( - ) night sweats Eyes: ( - ) blurriness of vision, ( - ) double vision, ( - ) watery eyes Ears, nose, mouth, throat, and face: ( - ) mucositis, ( - ) sore throat Respiratory: ( - ) cough, ( - ) dyspnea, ( - ) wheezes Cardiovascular: ( - ) palpitation, ( - ) chest discomfort, ( - ) lower extremity swelling Gastrointestinal:  ( - ) nausea, ( - ) heartburn, ( - ) change in bowel habits Skin: ( - ) abnormal skin rashes Lymphatics: ( - ) new lymphadenopathy, ( - ) easy bruising Neurological: ( - ) numbness, ( - ) tingling, ( - ) new weaknesses Behavioral/Psych: ( - ) mood change, ( - ) new changes  All other systems were reviewed with the patient and are negative.  PHYSICAL EXAMINATION: Telephone Visit  LABORATORY  DATA:  I have reviewed the data as listed CBC Latest Ref Rng & Units 11/21/2020 10/19/2020 09/26/2020  WBC 4.0 - 10.5 K/uL 4.6 7.9 5.6  Hemoglobin 12.0 - 15.0 g/dL 10.8(L) 12.4 11.9(L)  Hematocrit 36.0 - 46.0 % 33.2(L) 38.6 35.7(L)  Platelets 150 - 400 K/uL 209 265 225    CMP Latest Ref Rng & Units 11/21/2020 10/19/2020 09/26/2020  Glucose 70 - 99 mg/dL 82 153(H) 87  BUN 8 - 23 mg/dL 19 27(H) 20  Creatinine 0.44 - 1.00 mg/dL 0.65 0.58 0.68  Sodium 135 - 145 mmol/L 134(L) 131(L) 133(L)  Potassium 3.5 - 5.1 mmol/L 4.7 4.7 4.8  Chloride 98 - 111 mmol/L 101 94(L) 94(L)  CO2 22 - 32 mmol/L 30 27 33(H)  Calcium 8.9 - 10.3 mg/dL 9.5 9.6 10.0  Total Protein 6.5 - 8.1 g/dL 6.4(L) 7.3 6.9  Total Bilirubin 0.3 - 1.2 mg/dL 0.4 0.4 0.3  Alkaline Phos 38 - 126 U/L 43 52 62  AST 15 - 41 U/L _0 ALT 0 - 44 U/L _1 RADIOGRAPHIC STUDIES:  MRI Brain 08/05/2020:   CLINICAL DATA:  Trigeminal neuralgia. Headache, cluster/trigeminal. Brain/CNS neoplasm, assess treatment response. Metastatic lung cancer. Additional history provided: SRS to 5 brain metastases 03/17/2019, left parietal craniotomy for tumor resection 03/18/2019, SRS to 2 additional metastases 01/01/2020.  EXAM: MRI HEAD WITHOUT AND WITH CONTRAST  MRA HEAD WITHOUT CONTRAST  TECHNIQUE: Multiplanar, multiecho pulse sequences of the brain and surrounding structures were obtained without and with intravenous contrast. Angiographic images of the head were obtained using MRA technique without contrast.  CONTRAST:  60m MULTIHANCE GADOBENATE DIMEGLUMINE 529 MG/ML IV SOLN  COMPARISON:  Prior brain MRI examinations 04/30/2020  and earlier. MRA head 03/04/2019.  FINDINGS: MRI HEAD FINDINGS  Brain:  Stable generalized cerebral atrophy.  Again demonstrated is a hemosiderin stained left parietooccipital resection cavity. Irregular and gyriform enhancement has increased from the prior examination, now measuring up  to 12 mm in thickness (for instance as seen on series 14, image 90) (series 15, image 9). This irregular enhancement extends to the site of a previously demonstrated 3 mm nodular focus of enhancement within the superolateral aspect of the cavity. Surrounding T2/FLAIR hyperintense signal abnormality has not significantly changed.  A previously demonstrated 4 mm lesion within the medial right cerebellum is no longer appreciated.  A 4 mm enhancing lesion within the left cerebellar hemisphere was present on the prior examination but seen to better advantage on today's study (series 14, image 48).  New 2 mm enhancing lesion more posteriorly within the medial left cerebellar hemisphere (series 14, image 50).  New 2 mm enhancing lesion within the lateral left cerebellum (series 14, image 42).  A 3 mm lesion within the high posterior right frontal lobe has decreased in size (series 14, image 145) (previously 5 mm).  A previously demonstrated 2 mm enhancing lesion within the left frontal lobe has increased in size, now measuring 8 mm (series 14, image 125) (series 11, image 39) . The lesion now demonstrates central T2/FLAIR hyperintensity and T1 hyperintensity with a peripheral low signal rim. Precontrast T1 hyperintensity limits evaluation for enhancement at this site.  Multifocal T2/FLAIR hyperintensity elsewhere within the cerebral white matter and within the pons is nonspecific, but compatible chronic small vessel ischemic disease.  No evidence of acute infarction.  No extra-axial fluid collection.  No midline shift.  Vascular: Expected proximal arterial flow voids.  Skull and upper cervical spine: No focal suspicious marrow lesion.  Sinuses/Orbits: Visualized orbits show no acute finding. Trace ethmoid sinus mucosal thickening. No significant mastoid effusion.  MRA HEAD FINDINGS  The intracranial internal carotid arteries are patent. The M1 middle cerebral  arteries are patent without significant stenosis. No M2 proximal branch occlusion or high-grade proximal stenosis is identified. The anterior cerebral arteries are patent. 1-2 mm inferiorly projecting vascular protrusion arising from the supraclinoid right ICA which may reflect an infundibulum or small aneurysm (series 103, image 12).  The intracranial vertebral arteries are patent. The basilar artery is patent. The posterior cerebral arteries are patent.  IMPRESSION: MRI brain:  1. Irregular and gyriform enhancement at site of the left parietooccipital resection cavity has increased, now measuring up to 12 mm in thickness. This enhancement extends to the site of a previously demonstrated 3 mm nodular enhancing focus within the superolateral aspect of the cavity. Findings may reflect post-treatment inflammation or recurrent tumor. Short interval MRI follow-up is recommended. Surrounding T2 hyperintense signal abnormality has not appreciably changed. 2. New 2 mm metastasis within the posteromedial left cerebellar hemisphere. 3. New 2 mm metastasis within the lateral left cerebellum. 4. An additional 4 mm metastasis within the left cerebellum was present on the prior MRI of 04/30/2020, but is seen to better advantage on today's study. 5. A previously demonstrated 4 mm metastasis within the medial right cerebellum is no longer appreciated 6. 8 mm metastasis within the paramedian left frontal lobe, increased in size and now demonstrating precontrast T1 hyperintensity which limits evaluation for enhancement. 7. A 3 mm lesion within the high posterior right frontal lobe has decreased in size.  MRA head:  1. No intracranial large vessel occlusion or proximal high-grade arterial stenosis. 2. 1-2 mm  infundibulum versus small aneurysm arising from the supraclinoid right ICA.   Electronically Signed   By: Kellie Simmering DO   On: 08/05/2020 13:49  MR Brain W Wo  Contrast  Result Date: 11/03/2020 CLINICAL DATA:  Metastatic lung cancer, follow-up EXAM: MRI HEAD WITHOUT AND WITH CONTRAST TECHNIQUE: Multiplanar, multiecho pulse sequences of the brain and surrounding structures were obtained without and with intravenous contrast. CONTRAST:  93m MULTIHANCE GADOBENATE DIMEGLUMINE 529 MG/ML IV SOLN COMPARISON:  Most recent prior 08/05/2020 FINDINGS: Motion artifact is present. Brain: Left parieto-occipital resection cavity again identified with similar surrounding enhancement. Extent of adjacent T2 FLAIR hyperintensity is also unchanged. Small cerebellar lesions on prior study are no longer definitely identified. Small right frontal lesion is no longer identified. Left parasagittal frontal lesion is no longer identified with persistent susceptibility likely reflecting chronic blood products. Small right cerebellar developmental venous anomaly. Ventricles and sulci are stable in size and configuration. No hydrocephalus. Stable findings of probable chronic microvascular ischemic changes in the cerebral and pontine white matter. Vascular: Major vessel flow voids at the skull base are preserved. Skull and upper cervical spine: Normal marrow signal is within normal limits. Sinuses/Orbits: Paranasal sinuses are aerated. Orbits are unremarkable. Other: Sella is unremarkable.  Mastoid air cells are clear. IMPRESSION: Stable enhancement adjacent to left parieto-occipital resection cavity. There is otherwise no enhancement identified at the site of previously seen lesions. No new lesion identified. Electronically Signed   By: PMacy MisM.D.   On: 11/03/2020 14:22    ASSESSMENT & PLAN [name] 80y.o. female with medical history significant for metastatic adenocarcinoma of the lung with brain metastasis who presents for a follow up visit.  After review the records, review of the labs, review the imaging, discussion with the patient the findings are most consistent with a  stage IV adenocarcinoma of the lung with a small lung lesion and metastatic spread to the brain.  Her metastatic brain mets have been treated with resection of tumor on 03/18/2019 as well as SRS on 12/12/2019.  Most recently she was found to have 2 new subcentimeter lesions and underwent stereotactic radiation therapy which was completed on 05/20/2020. Unfortunately on 08/05/2020 she was noted to have new CNS metastatic disease.   There are limitations in what therapies can be used for this patient given her poor functional status.  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  After extensive discussions with the patient's about the risks and benefits of this treatment she was agreeable to starting osimertinib 80 mg p.o. daily for her metastatic adenocarcinoma of the lung. She started this treatment on 09/05/2020.  On discussion today [name] appears to be having low grade HFS. I would recommend a brief break in her Tagrisso to see if this improves. Recommend Eucerin cream in the interim. We can consider restarting once this resolves, I would assume this would be 1-2 weeks time.  Overall she is tolerating Tagrisso well and is willing and able to proceed at this time.  Restaging MRI is scheduled for later January 2022.  #Stage IV (([id number] adenocarcinoma of the lung: -Left lower lobe resection on 12/21/2010, 14 lymph nodes negative, 2.2 cm tumor size, margins negative. -most recent MRI brain scan shows stable disease. No evidence of Chest/abdomen involvement on scan from 10/19/2020.  --started therapy with osimertinib 80 mg PO daily for her EGFR Exon 19 mutation. She started on 09/05/2020 -CT chest with contrast on  10/20/2019 showed no active disease in the chest. Continue CT scans q 3 months.  -PD-L1 testing was 1%. -- Patient is a poor candidate for systemic therapy, though she does have an EGFR Exon 19 mutation.  --RTC in 4 weeks time to assure continued  tolerance of osimirtenib.   #Concern for Hand Foot Syndrome --noted to have desquamation and erythema of finger tips --hold therapy for 1-2 weeks to assure resolution --will plan for our clinic to call her in 1 week to assess her situation.  --plan to restart Tagrisso after resolution with aggressive moisturizing lotion.   #Brain metastasis, progressing. -Presentation with expressive aphasia to ER on 03/03/2019 along with right-sided weakness. Found to have multiple brain metastasis and underwent SRS on 03/17/2019. 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 -Underwent SRS on 01/01/2020 for 2 subcentimeter brain lesions. -Brain MRI on 04/30/2020 showed 2 new subcentimeter meta stasis in the right cerebellum and right frontal lobe. -Stereotactic radiation therapy was completed on 05/20/2020 --most recent MRI on 08/05/2020 showed progression on disease. Starting systemic therapy as above.  --Next MRI to be performed on 11/03/2020.   No orders of the defined types were placed in this encounter.   All questions were answered. The patient knows to call the clinic with any problems, questions or concerns.  A total of more than 30 minutes were spent on this encounter and over half of that time was spent on counseling and coordination of care as outlined above.   Ledell Peoples, MD Department of Hematology/Oncology [place] [facility] Phone: [phone] Pager: [phone] Email: [email or web address]  11/21/2020 11:32 AM    Suszanne Conners HA, Su JM, [place] SH, Rowe Pavy, [place], [place]. 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 J Thorac Oncol. 2020 Nov;15(11):1758-1766. doi: 10.1016/j.jtho.2020.06.018. Epub 2020 Jul 9. PMID: 07121975.  --Osimertinib is a  promising treatment option for EGFR-mutated NSCLC with LM regardless of T790M mutational status.

## 2020-11-22 ENCOUNTER — Other Ambulatory Visit: Payer: Self-pay | Admitting: Family Medicine

## 2020-11-22 ENCOUNTER — Encounter: Payer: Self-pay | Admitting: Hematology and Oncology

## 2020-11-24 ENCOUNTER — Telehealth: Payer: Self-pay | Admitting: *Deleted

## 2020-11-24 ENCOUNTER — Encounter: Payer: Self-pay | Admitting: *Deleted

## 2020-11-24 ENCOUNTER — Encounter: Payer: Self-pay | Admitting: Family Medicine

## 2020-11-24 NOTE — Telephone Encounter (Signed)
Received call from pt requesting to have her appts moved from 12/19/20 to 12/22/20 . She also states that her fingers are doing better-not peeling and blistering as much. She is keeping Eucerin cream and cotton gloves on as directed.  Scheduling message sent

## 2020-11-29 ENCOUNTER — Other Ambulatory Visit: Payer: Self-pay | Admitting: Family Medicine

## 2020-11-29 ENCOUNTER — Other Ambulatory Visit: Payer: Self-pay | Admitting: Gastroenterology

## 2020-11-29 MED ORDER — TRELEGY ELLIPTA 100-62.5-25 MCG/INH IN AEPB: INHALATION_SPRAY | RESPIRATORY_TRACT | 0 refills | Status: DC

## 2020-12-02 ENCOUNTER — Other Ambulatory Visit: Payer: Self-pay | Admitting: Family Medicine

## 2020-12-02 DIAGNOSIS — R0902 Hypoxemia: Secondary | ICD-10-CM | POA: Diagnosis not present

## 2020-12-02 DIAGNOSIS — J9621 Acute and chronic respiratory failure with hypoxia: Secondary | ICD-10-CM | POA: Diagnosis not present

## 2020-12-02 DIAGNOSIS — J449 Chronic obstructive pulmonary disease, unspecified: Secondary | ICD-10-CM | POA: Diagnosis not present

## 2020-12-02 DIAGNOSIS — C3490 Malignant neoplasm of unspecified part of unspecified bronchus or lung: Secondary | ICD-10-CM | POA: Diagnosis not present

## 2020-12-05 ENCOUNTER — Encounter: Payer: Self-pay | Admitting: Hematology and Oncology

## 2020-12-05 ENCOUNTER — Telehealth: Payer: Self-pay | Admitting: *Deleted

## 2020-12-05 NOTE — Telephone Encounter (Signed)
Received vm message from patient stating that she has been off the [place] for 2 weeks and the skin problems she had on her hands is now completely resolved.  She is asking whether or not she should resume the [place] or not.  Please advise

## 2020-12-07 ENCOUNTER — Telehealth: Payer: Self-pay | Admitting: *Deleted

## 2020-12-07 NOTE — Telephone Encounter (Signed)
TCT patient regarding her Tagrisso. Pt had called and said her hands were completely healed after being off the [place] for 2 weeks.  She is asing if she should restart the [place]. Spoke to Dr. [name] about this. He recommends restarting the Tagrisso and we will see how her hands do.  Pt is agreeable to this. She is aware of her upcoming appt on 12/22/20

## 2020-12-19 ENCOUNTER — Ambulatory Visit: Payer: PPO | Admitting: Hematology and Oncology

## 2020-12-19 ENCOUNTER — Other Ambulatory Visit: Payer: PPO

## 2020-12-20 ENCOUNTER — Other Ambulatory Visit: Payer: Self-pay

## 2020-12-20 ENCOUNTER — Ambulatory Visit (INDEPENDENT_AMBULATORY_CARE_PROVIDER_SITE_OTHER): Payer: PPO | Admitting: *Deleted

## 2020-12-20 DIAGNOSIS — M810 Age-related osteoporosis without current pathological fracture: Secondary | ICD-10-CM | POA: Diagnosis not present

## 2020-12-20 MED ORDER — DENOSUMAB 60 MG/ML ~~LOC~~ SOSY
60.0000 mg | PREFILLED_SYRINGE | Freq: Once | SUBCUTANEOUS | Status: AC
  Administered 2020-12-20: 60 mg via SUBCUTANEOUS

## 2020-12-20 NOTE — Patient Instructions (Signed)
Return in 6 months for next Prolia injection.

## 2020-12-22 ENCOUNTER — Ambulatory Visit: Payer: PPO

## 2020-12-22 ENCOUNTER — Other Ambulatory Visit: Payer: Self-pay

## 2020-12-22 ENCOUNTER — Inpatient Hospital Stay (HOSPITAL_BASED_OUTPATIENT_CLINIC_OR_DEPARTMENT_OTHER): Payer: PPO | Admitting: Hematology and Oncology

## 2020-12-22 ENCOUNTER — Other Ambulatory Visit: Payer: Self-pay | Admitting: Hematology and Oncology

## 2020-12-22 ENCOUNTER — Inpatient Hospital Stay: Payer: PPO | Attending: Internal Medicine

## 2020-12-22 VITALS — BP 104/71 | HR 71 | Temp 98.6°F | Resp 16 | Ht 60.0 in | Wt 114.2 lb

## 2020-12-22 DIAGNOSIS — C3492 Malignant neoplasm of unspecified part of left bronchus or lung: Secondary | ICD-10-CM | POA: Diagnosis not present

## 2020-12-22 DIAGNOSIS — Z79899 Other long term (current) drug therapy: Secondary | ICD-10-CM | POA: Insufficient documentation

## 2020-12-22 DIAGNOSIS — Z7952 Long term (current) use of systemic steroids: Secondary | ICD-10-CM | POA: Insufficient documentation

## 2020-12-22 DIAGNOSIS — C7931 Secondary malignant neoplasm of brain: Secondary | ICD-10-CM | POA: Diagnosis not present

## 2020-12-22 DIAGNOSIS — Z8249 Family history of ischemic heart disease and other diseases of the circulatory system: Secondary | ICD-10-CM | POA: Insufficient documentation

## 2020-12-22 DIAGNOSIS — Z87891 Personal history of nicotine dependence: Secondary | ICD-10-CM | POA: Insufficient documentation

## 2020-12-22 DIAGNOSIS — Z800 Family history of malignant neoplasm of digestive organs: Secondary | ICD-10-CM | POA: Diagnosis not present

## 2020-12-22 DIAGNOSIS — C3432 Malignant neoplasm of lower lobe, left bronchus or lung: Secondary | ICD-10-CM | POA: Insufficient documentation

## 2020-12-22 DIAGNOSIS — C3490 Malignant neoplasm of unspecified part of unspecified bronchus or lung: Secondary | ICD-10-CM | POA: Diagnosis not present

## 2020-12-22 DIAGNOSIS — Z90710 Acquired absence of both cervix and uterus: Secondary | ICD-10-CM | POA: Insufficient documentation

## 2020-12-22 DIAGNOSIS — Z803 Family history of malignant neoplasm of breast: Secondary | ICD-10-CM | POA: Diagnosis not present

## 2020-12-22 DIAGNOSIS — Z9981 Dependence on supplemental oxygen: Secondary | ICD-10-CM | POA: Diagnosis not present

## 2020-12-22 DIAGNOSIS — E039 Hypothyroidism, unspecified: Secondary | ICD-10-CM | POA: Diagnosis not present

## 2020-12-22 DIAGNOSIS — Z515 Encounter for palliative care: Secondary | ICD-10-CM | POA: Diagnosis not present

## 2020-12-22 DIAGNOSIS — J9611 Chronic respiratory failure with hypoxia: Secondary | ICD-10-CM | POA: Diagnosis not present

## 2020-12-22 LAB — CBC WITH DIFFERENTIAL (CANCER CENTER ONLY)
Abs Immature Granulocytes: 0.01 10*3/uL (ref 0.00–0.07)
Basophils Absolute: 0 10*3/uL (ref 0.0–0.1)
Basophils Relative: 1 %
Eosinophils Absolute: 0.1 10*3/uL (ref 0.0–0.5)
Eosinophils Relative: 2 %
HCT: 35.9 % — ABNORMAL LOW (ref 36.0–46.0)
Hemoglobin: 11.6 g/dL — ABNORMAL LOW (ref 12.0–15.0)
Immature Granulocytes: 0 %
Lymphocytes Relative: 14 %
Lymphs Abs: 0.7 10*3/uL (ref 0.7–4.0)
MCH: 29.7 pg (ref 26.0–34.0)
MCHC: 32.3 g/dL (ref 30.0–36.0)
MCV: 91.8 fL (ref 80.0–100.0)
Monocytes Absolute: 0.4 10*3/uL (ref 0.1–1.0)
Monocytes Relative: 8 %
Neutro Abs: 4 10*3/uL (ref 1.7–7.7)
Neutrophils Relative %: 75 %
Platelet Count: 212 10*3/uL (ref 150–400)
RBC: 3.91 MIL/uL (ref 3.87–5.11)
RDW: 13.5 % (ref 11.5–15.5)
WBC Count: 5.2 10*3/uL (ref 4.0–10.5)
nRBC: 0 % (ref 0.0–0.2)

## 2020-12-22 LAB — CMP (CANCER CENTER ONLY)
ALT: 21 U/L (ref 0–44)
AST: 20 U/L (ref 15–41)
Albumin: 4.1 g/dL (ref 3.5–5.0)
Alkaline Phosphatase: 55 U/L (ref 38–126)
Anion gap: 4 — ABNORMAL LOW (ref 5–15)
BUN: 18 mg/dL (ref 8–23)
CO2: 30 mmol/L (ref 22–32)
Calcium: 9.4 mg/dL (ref 8.9–10.3)
Chloride: 98 mmol/L (ref 98–111)
Creatinine: 0.64 mg/dL (ref 0.44–1.00)
GFR, Estimated: >60 mL/min (ref >60)
Glucose, Bld: 86 mg/dL (ref 70–99)
Potassium: 4.9 mmol/L (ref 3.5–5.1)
Sodium: 132 mmol/L — ABNORMAL LOW (ref 135–145)
Total Bilirubin: 0.5 mg/dL (ref 0.3–1.2)
Total Protein: 6.8 g/dL (ref 6.5–8.1)

## 2020-12-22 LAB — TSH: TSH: 2.339 u[IU]/mL (ref 0.308–3.960)

## 2020-12-22 LAB — LACTATE DEHYDROGENASE: LDH: 165 U/L (ref 98–192)

## 2020-12-23 NOTE — Progress Notes (Signed)
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 0/14 lymph nodes.  No perineural invasion or LVI.  negative margins 2) 03/03/2019: presented to ED with expressive aphasia, found to have multiple brain metastasis and underwent SRS on 03/17/2019. 3) 03/18/2019: Stereotactic left parietal craniotomy for resection of tumor 4) 04/30/2020: Brain MRI on showed 2 new subcentimeter meta stasis in the right cerebellum and right frontal lobe. 5)  05/20/2020: Stereotactic radiation therapy was completed 6) 06/23/2020: establish care with Dr. [name]  7) 08/29/2020: prescribed osimirtinib therapy due to next metastatic disease in the brain.  8) 09/05/2020: started osimirtinib 19m PO daily 9) 10/19/2020: CT Chest showed no evidence of residual disease 10) 11/21/2020: desquamation and erythema of finger tips. Concern for HFS. Temporarily holding therapy  11) 12/05/2020: restarted Tagrisso after resolution of the HFS symptoms  Interval History:  [name] 80y.o. female with medical history significant for metastatic adenocarcinoma of the lung with brain metastasis who presents for a follow up visit. The patient's last visit was on 11/21/2020.  In the interim since the last visit the patient has restarted her Tagrisso after complete resolution of her HFS symptoms.   On exam today [name] reports she has had a marked improvement in her hands with  the 2-week hiatus on her Tagrisso therapy.  She has restarted for the last 2 weeks and is only had some mild erythema which she has been aggressively using moisturizing cream.  She reports no pain or tenderness at this time.  She notes that she does still have her baseline level of lethargy and that she "cannot get off the couch" she does use a walker when walking.  Otherwise she has tolerated therapy quite well.  She is not having any other major symptoms as a result of her therapy.  She currently denies any fevers, chills, sweats, nausea vomiting or diarrhea.  A full ten point ROS is listed below.  MEDICAL HISTORY:  Past Medical History:  Diagnosis Date  . Adenocarcinoma of lung ([facility] 12/2010   left lung/surg only  . Allergic rhinitis   . 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[facility]    corrected by surgery 08/21/13  . Anxiety disorder   . Aortic aneurysm ([facility]   . Brain metastasis ([place]    lung cancer s/p resection 7/20  . Cancer ([facility]    Phreesia 05/26/2020  . Complication of anesthesia 2011   bloodpressure dropped during colonoscopy, not problems since  . Compression fracture   . COPD (chronic obstructive pulmonary disease) ([place] [place]   . Depression   . Diastolic dysfunction   . Diverticulosis   . Dysphagia   . Emphysema lung ([place] 11/08/2014  . Headache   . Hiatal hernia   . History of kidney stones   . Hypothyroidism   . IBS (irritable bowel syndrome)   . Iron deficiency anemia due to chronic blood loss 12/05/2016  . On home O2 12/15/2013   chronic hypoxia  . Pneumonia   . PUD (peptic ulcer disease)    89yr . Shortness  of breath    with exertion  . SIADH (syndrome of inappropriate ADH production) ([place])   . Trigeminal neuralgia     SURGICAL HISTORY: Past Surgical History:  Procedure Laterality Date  . ABDOMINAL HYSTERECTOMY    . APPLICATION OF CRANIAL NAVIGATION N/A 03/18/2019   Procedure: APPLICATION OF CRANIAL NAVIGATION;  Surgeon: Consuella Lose, MD;  Location: [facility];  Service:  Neurosurgery;  Laterality: N/A;  . BACK SURGERY  January 13, 2014  . BRAIN SURGERY N/A    Phreesia 03/28/2020  . CATARACT EXTRACTION    . CATARACT EXTRACTION W/PHACO  09/02/2012   Procedure: CATARACT EXTRACTION PHACO AND INTRAOCULAR LENS PLACEMENT (IOC);  Surgeon: Elta Guadeloupe T. Gershon Crane, MD;  Location: [facility];  Service: Ophthalmology;  Laterality: Left;  CDE:10.35  . CHOLECYSTECTOMY    . 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 COLONOSCOPY N/A 11/23/2014   Dr. Oneida Alar: moderate diverticulosis, hemorrhoids, redundant colon. next TCS in 10-15 years.   . CRANIOTOMY Left 03/18/2019   Procedure: Left stereotactic craniotomy for tumor resection;  Surgeon: Consuella Lose, MD;  Location: [facility];  Service: Neurosurgery;  Laterality: Left;  Left stereotactic craniotomy for tumor resection  . ENDARTERECTOMY Right 08/21/2013   Procedure: RIGHT CAROTID ANEURYSM RESECTION;  Surgeon: Rosetta Posner, MD;  Location: [facility];  Service: Vascular;  Laterality: Right;  . ESOPHAGEAL DILATION  02/24/2018   Procedure: ESOPHAGEAL DILATION;  Surgeon: Danie Binder, MD;  [facility];  Service: Endoscopy;;  . ESOPHAGOGASTRODUODENOSCOPY  11/13/2009   w/dilation to 37m, gastric ulceration (H.Pylori) s/p treatment  . ESOPHAGOGASTRODUODENOSCOPY  11/21/2009   distal esophageal web, gastritis  . ESOPHAGOGASTRODUODENOSCOPY  03/14/12   SZOX:[id number] the distal esophagus/Mild gastritis/small HH. + H.pylori, prescribed Pylera. Finished treatment.   . ESOPHAGOGASTRODUODENOSCOPY N/A 12/13/2017   Dr. FOneida Alar esophageal web s/p dilation, gastritis, no h.pylori  . ESOPHAGOGASTRODUODENOSCOPY N/A 02/24/2018   Dr. FOneida Alar web in prox esophagus and in distal esophagus s/p dilation, mild gastritis, mild pyloric stenosis, mild post ulcer duodenal deformity at D1/D2  . fatty tumor removal from lt groin    . FRACTURE SURGERY Left    hand and left ring finger  . GIVENS CAPSULE STUDY N/A 12/26/2017    incomplete  . GIVENS CAPSULE STUDY N/A 02/24/2018   normal small bowel  . KNEE SURGERY     patella tendon repair june 2019 [facility]  . LUNG CANCER SURGERY  12/2010   Left VATS, minithoracotomy, LLL superior segmentectomy  . ORIF PATELLA Right 03/18/2018   Procedure: OPEN REDUCTION INTERNAL (ORIF) FIXATION RIGHT PATELLA;  Surgeon: HCarole Civil MD;  [facility];  Service: Orthopedics;  Laterality: Right;  . ORIF WRIST FRACTURE Left 04/09/2014   Procedure: OPEN REDUCTION INTERNAL FIXATION (ORIF) WRIST FRACTURE;  Surgeon: TRenette Butters MD;  [place] [facility]  Service: Orthopedics;  Laterality: Left;  . PERCUTANEOUS PINNING Left 04/09/2014   Procedure: PERCUTANEOUS PINNING EXTREMITY;  Surgeon: TRenette Butters MD;  [place] [facility]  Service: Orthopedics;  Laterality: Left;  . SAVORY DILATION N/A 12/13/2017   Procedure: SAVORY DILATION;  Surgeon: FDanie Binder MD;  [facility];  Service: Endoscopy;  Laterality: N/A;  . TUBAL LIGATION    . vocal cord surgery  02/06/2011   laryngoscopy with bilateral vocal cord Radiesse injection for vocal cord paralysis  . YAG LASER APPLICATION Left 181/19/1478  Procedure: YAG LASER APPLICATION;  Surgeon: MRutherford Guys MD;  [facility];  Service: Ophthalmology;  Laterality: Left;    SOCIAL HISTORY: Social History   Socioeconomic History  . Marital status: Divorced    Spouse name: Not on file  . Number of children: Not on file  . Years of education: Not on file  . Highest education level: Not on file  Occupational History  . Not on file  Tobacco Use  . Smoking status: Former Smoker    Packs/day: 0.50    Years: 20.00    Pack years: 10.00    Types: Cigarettes    Quit date: 12/21/2010    Years since quitting: 10.0  . Smokeless tobacco: Never Used  . Tobacco comment: smoking cessation info given and reviewed   Vaping Use  . Vaping Use: Never used  Substance and Sexual Activity  . Alcohol use: Not Currently    Alcohol/week:  0.0 standard drinks  . Drug use: No  . Sexual activity: Yes    Birth control/protection: Surgical  Other Topics Concern  . Not on file  Social History Narrative   Divorced since 44.Lives alone with a dog.Marland KitchenRetired.  She receives Meals on Wheels.   Social Determinants of Health   Financial Resource Strain: Low Risk   . Difficulty of Paying Living Expenses: Not hard at all  Food Insecurity: Not on file  Transportation Needs: Not on file  Physical Activity: Not on file  Stress: Not on file  Social Connections: Not on file  Intimate Partner Violence: Not on file    FAMILY HISTORY: Family History  Problem Relation Age of Onset  . Pulmonary embolism Mother   . Heart attack Father   . Hypertension Father   . Liver cancer Sister   . Cancer Sister   . Cancer Brother   . Cancer Daughter   . Breast cancer Daughter   . Colon cancer Neg Hx     ALLERGIES:  is allergic to ciprofloxacin hcl, sulfonamide derivatives, ciprofloxacin, iohexol, iodinated diagnostic agents, and prozac [fluoxetine hcl].  MEDICATIONS:  Current Outpatient Medications  Medication Sig Dispense Refill  . albuterol (VENTOLIN HFA) 108 (90 Base) MCG/ACT inhaler Inhale 2 puffs into the lungs every 6 (six) hours as needed for wheezing or shortness of breath. 18 g 2  . ALPRAZolam (XANAX) 0.5 MG tablet TAKE (1) TABLET BY MOUTH TWICE A DAY AS NEEDED. 60 tablet 0  . cephALEXin (KEFLEX) 500 MG capsule Take 1 capsule (500 mg total) by mouth 3 (three) times daily. 21 capsule 0  . citalopram (CELEXA) 40 MG tablet TAKE (1/2) TABLET BY MOUTH AT BEDTIME. 45 tablet 0  . diphenhydrAMINE (BENADRYL) 50 MG tablet Take 1 tablet (50 mg total) by mouth once for 1 dose. Take one hour prior to CT scan 1 tablet 0  . ferrous sulfate 325 (65 FE) MG tablet Take 325 mg by mouth daily with breakfast.    . fluticasone (FLONASE) 50 MCG/ACT nasal spray Place 2 sprays into both nostrils daily. 16 g 0  . Fluticasone-Umeclidin-Vilant (TRELEGY  ELLIPTA) 100-62.5-25 MCG/INH AEPB INHALE 1 PUFF IN THE LUNGS DAILY 60 each 0  . gabapentin (NEURONTIN) 100 MG capsule TAKE 1 CAPSULE BY MOUTH THREE TIMES A DAY. 90 capsule 0  . levocetirizine (XYZAL) 5 MG tablet TAKE ONE TABLET BY MOUTH ONCE DAILY. 30 tablet 0  . levothyroxine (SYNTHROID) 50 MCG tablet TAKE 1 TABLET BEFORE BREAKFAST. 90 tablet 0  . Multiple Vitamin (MULTIVITAMIN WITH MINERALS) TABS tablet Take 1 tablet by mouth daily.    . ondansetron (ZOFRAN) 4 MG tablet TAKE 1 TABLET  BY MOUTH 3 TIMES DAILY AS NEEDED FOR NAUSEA AND VOMITING. 90 tablet 0  . OVER THE COUNTER MEDICATION Bone Support 2-4 tabs daily    . OXcarbazepine (TRILEPTAL) 150 MG tablet TAKE (1) TABLET BY MOUTH TWICE DAILY. 60 tablet 0  . pantoprazole (PROTONIX) 40 MG tablet TAKE (1) TABLET BY MOUTH ONCE DAILY BEFORE BREAKFAST. 90 tablet 1  . predniSONE (DELTASONE) 50 MG tablet Please take one tablet at 13 hours, 7 hours and one hour prior to CT scan 3 tablet 0  . Probiotic Product (ALIGN PO) Take by mouth daily.    Marland Kitchen PROLIA 60 MG/ML SOSY injection INJECT INTO UPPER ARM, THIGH, OR ABDOMEN ONCE. 1 mL 0  . TAGRISSO 80 MG tablet TAKE 1 TABLET (80 MG TOTAL) BY MOUTH DAILY. 30 tablet 2   No current facility-administered medications for this visit.    REVIEW OF SYSTEMS:   Constitutional: ( - ) fevers, ( - )  chills , ( - ) night sweats Eyes: ( - ) blurriness of vision, ( - ) double vision, ( - ) watery eyes Ears, nose, mouth, throat, and face: ( - ) mucositis, ( - ) sore throat Respiratory: ( - ) cough, ( - ) dyspnea, ( - ) wheezes Cardiovascular: ( - ) palpitation, ( - ) chest discomfort, ( - ) lower extremity swelling Gastrointestinal:  ( - ) nausea, ( - ) heartburn, ( - ) change in bowel habits Skin: ( - ) abnormal skin rashes Lymphatics: ( - ) new lymphadenopathy, ( - ) easy bruising Neurological: ( - ) numbness, ( - ) tingling, ( - ) new weaknesses Behavioral/Psych: ( - ) mood change, ( - ) new changes  All other  systems were reviewed with the patient and are negative.  PHYSICAL EXAMINATION: Telephone Visit  LABORATORY DATA:  I have reviewed the data as listed CBC Latest Ref Rng & Units 12/22/2020 11/21/2020 10/19/2020  WBC 4.0 - 10.5 K/uL 5.2 4.6 7.9  Hemoglobin 12.0 - 15.0 g/dL 11.6(L) 10.8(L) 12.4  Hematocrit 36.0 - 46.0 % 35.9(L) 33.2(L) 38.6  Platelets 150 - 400 K/uL 212 209 265    CMP Latest Ref Rng & Units 12/22/2020 11/21/2020 10/19/2020  Glucose 70 - 99 mg/dL 86 82 153(H)  BUN 8 - 23 mg/dL 18 19 27(H)  Creatinine 0.44 - 1.00 mg/dL 0.64 0.65 0.58  Sodium 135 - 145 mmol/L 132(L) 134(L) 131(L)  Potassium 3.5 - 5.1 mmol/L 4.9 4.7 4.7  Chloride 98 - 111 mmol/L 98 101 94(L)  CO2 22 - 32 mmol/L _0 Calcium 8.9 - 10.3 mg/dL 9.4 9.5 9.6  Total Protein 6.5 - 8.1 g/dL 6.8 6.4(L) 7.3  Total Bilirubin 0.3 - 1.2 mg/dL 0.5 0.4 0.4  Alkaline Phos 38 - 126 U/L 55 43 52  AST 15 - 41 U/L _1 ALT 0 - 44 U/L _2 RADIOGRAPHIC STUDIES:  MRI Brain 08/05/2020:   CLINICAL DATA:  Trigeminal neuralgia. Headache, cluster/trigeminal. Brain/CNS neoplasm, assess treatment response. Metastatic lung cancer. Additional history provided: SRS to 5 brain metastases 03/17/2019, left parietal craniotomy for tumor resection 03/18/2019, SRS to 2 additional metastases 01/01/2020.  EXAM: MRI HEAD WITHOUT AND WITH CONTRAST  MRA HEAD WITHOUT CONTRAST  TECHNIQUE: Multiplanar, multiecho pulse sequences of the brain and surrounding structures were obtained without and with intravenous contrast. Angiographic images of the head were obtained using MRA technique without contrast.  CONTRAST:  22m MULTIHANCE GADOBENATE DIMEGLUMINE 529 MG/ML IV SOLN  COMPARISON:  Prior brain MRI examinations 04/30/2020 and earlier. MRA head 03/04/2019.  FINDINGS: MRI HEAD FINDINGS  Brain:  Stable generalized cerebral atrophy.  Again demonstrated is a hemosiderin stained left  parietooccipital resection cavity. Irregular and gyriform enhancement has increased from the prior examination, now measuring up to 12 mm in thickness (for instance as seen on series 14, image 90) (series 15, image 9). This irregular enhancement extends to the site of a previously demonstrated 3 mm nodular focus of enhancement within the superolateral aspect of the cavity. Surrounding T2/FLAIR hyperintense signal abnormality has not significantly changed.  A previously demonstrated 4 mm lesion within the medial right cerebellum is no longer appreciated.  A 4 mm enhancing lesion within the left cerebellar hemisphere was present on the prior examination but seen to better advantage on today's study (series 14, image 48).  New 2 mm enhancing lesion more posteriorly within the medial left cerebellar hemisphere (series 14, image 50).  New 2 mm enhancing lesion within the lateral left cerebellum (series 14, image 42).  A 3 mm lesion within the high posterior right frontal lobe has decreased in size (series 14, image 145) (previously 5 mm).  A previously demonstrated 2 mm enhancing lesion within the left frontal lobe has increased in size, now measuring 8 mm (series 14, image 125) (series 11, image 39) . The lesion now demonstrates central T2/FLAIR hyperintensity and T1 hyperintensity with a peripheral low signal rim. Precontrast T1 hyperintensity limits evaluation for enhancement at this site.  Multifocal T2/FLAIR hyperintensity elsewhere within the cerebral white matter and within the pons is nonspecific, but compatible chronic small vessel ischemic disease.  No evidence of acute infarction.  No extra-axial fluid collection.  No midline shift.  Vascular: Expected proximal arterial flow voids.  Skull and upper cervical spine: No focal suspicious marrow lesion.  Sinuses/Orbits: Visualized orbits show no acute finding. Trace ethmoid sinus mucosal thickening. No  significant mastoid effusion.  MRA HEAD FINDINGS  The intracranial internal carotid arteries are patent. The M1 middle cerebral arteries are patent without significant stenosis. No M2 proximal branch occlusion or high-grade proximal stenosis is identified. The anterior cerebral arteries are patent. 1-2 mm inferiorly projecting vascular protrusion arising from the supraclinoid right ICA which may reflect an infundibulum or small aneurysm (series 103, image 12).  The intracranial vertebral arteries are patent. The basilar artery is patent. The posterior cerebral arteries are patent.  IMPRESSION: MRI brain:  1. Irregular and gyriform enhancement at site of the left parietooccipital resection cavity has increased, now measuring up to 12 mm in thickness. This enhancement extends to the site of a previously demonstrated 3 mm nodular enhancing focus within the superolateral aspect of the cavity. Findings may reflect post-treatment inflammation or recurrent tumor. Short interval MRI follow-up is recommended. Surrounding T2 hyperintense signal abnormality has not appreciably changed. 2. New 2 mm metastasis within the posteromedial left cerebellar hemisphere. 3. New 2 mm metastasis within the lateral left cerebellum. 4. An additional 4 mm metastasis within the left cerebellum was present on the prior MRI of 04/30/2020, but is seen to better advantage on today's study. 5. A previously demonstrated 4 mm metastasis within the medial right cerebellum is no longer appreciated 6. 8 mm metastasis within the paramedian left frontal lobe, increased in size and now demonstrating precontrast T1 hyperintensity which limits evaluation for enhancement. 7. A 3 mm lesion within the high posterior right frontal lobe has decreased in size.  MRA head:  1. No intracranial large vessel occlusion or  proximal high-grade arterial stenosis. 2. 1-2 mm infundibulum versus small aneurysm arising from  the supraclinoid right ICA.   Electronically Signed   By: Kellie Simmering DO   On: 08/05/2020 13:49  No results found.  ASSESSMENT & PLAN [name] 80 y.o. female with medical history significant for metastatic adenocarcinoma of the lung with brain metastasis who presents for a follow up visit.  After review the records, review of the labs, review the imaging, discussion with the patient the findings are most consistent with a stage IV adenocarcinoma of the lung with a small lung lesion and metastatic spread to the brain.  Her metastatic brain mets have been treated with resection of tumor on 03/18/2019 as well as SRS on 12/12/2019.  Most recently she was found to have 2 new subcentimeter lesions and underwent stereotactic radiation therapy which was completed on 05/20/2020. Unfortunately on 08/05/2020 she was noted to have new CNS metastatic disease.   There are limitations in what therapies can be used for this patient given her poor functional status.  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  After extensive discussions with the patient's about the risks and benefits of this treatment she was agreeable to starting osimertinib 80 mg p.o. daily for her metastatic adenocarcinoma of the lung. She started this treatment on 09/05/2020.  On discussion today [name] appears to have recovered well from the HFS.  She was off the [place] therapy for 2 weeks and has been back on for 2 weeks with only some mild dryness of her hands.. Recommend continued Eucerin cream. We can consider restarting once this resolves, I would assume this would be 1-2 weeks time.  Overall she is tolerating Tagrisso well and is willing and able to proceed at this time.  Restaging MRI is scheduled for later April 2022.  #Stage IV ([id number]) adenocarcinoma of the lung: -Left lower lobe resection on 12/21/2010, 14 lymph nodes negative, 2.2 cm tumor size, margins negative. -most  recent MRI brain scan shows stable disease. No evidence of Chest/abdomen involvement on scan from 10/19/2020.  --started therapy with osimertinib 80 mg PO daily for her EGFR Exon 19 mutation. She started on 09/05/2020 -CT chest with contrast on 10/20/2019 showed no active disease in the chest. Continue CT scans q 3 months.  -PD-L1 testing was 1%. -- Patient is a poor candidate for systemic therapy, though she does have an EGFR Exon 19 mutation.  --RTC in 4 weeks time to assure continued tolerance of osimirtenib.   #Concern for Hand Foot Syndrome, improved --noted to have desquamation and erythema of finger tips --held therapy for 1-2 weeks to assure resolution --patient had resolution of symptoms after 2 weeks off therapy. Encouraged continue aggressive moisturizing lotion.   #Brain metastasis, stable -Presentation with expressive aphasia to ER on 03/03/2019 along with right-sided weakness. Found to have multiple brain metastasis and underwent SRS on 03/17/2019. 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 -Underwent SRS on 01/01/2020 for 2 subcentimeter brain lesions. -Brain MRI on 04/30/2020 showed 2 new subcentimeter meta stasis in the right cerebellum and right frontal lobe. -Stereotactic radiation therapy was completed on 05/20/2020 --most recent MRI on 08/05/2020 showed progression on disease. Starting systemic therapy as above.  --Next MRI to be performed on 02/03/2021.   Orders Placed This Encounter  Procedures  . CT CHEST ABDOMEN PELVIS W CONTRAST    Standing Status:  Future    Standing Expiration Date:   12/23/2021    Order Specific Question:   If indicated for the ordered procedure, I authorize the administration of contrast media per Radiology protocol    Answer:   Yes    Order Specific Question:   Preferred imaging location?    Answer:   [facility]    Order Specific Question:   Is Oral  Contrast requested for this exam?    Answer:   Yes, Per Radiology protocol    Order Specific Question:   Reason for Exam (SYMPTOM  OR DIAGNOSIS REQUIRED)    Answer:   lung cancer, assess for recurrence    All questions were answered. The patient knows to call the clinic with any problems, questions or concerns.  A total of more than 30 minutes were spent on this encounter and over half of that time was spent on counseling and coordination of care as outlined above.   Ledell Peoples, MD Department of Hematology/Oncology [place] [facility] Phone: [phone] Pager: [phone] Email: [email or web address]  12/23/2020 11:53 AM    Suszanne Conners HA, Su JM, [place] SH, Rowe Pavy, [place], Wanda [place]. 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 J Thorac Oncol. 2020 Nov;15(11):1758-1766. doi: 10.1016/j.jtho.2020.06.018. Epub 2020 Jul 9. PMID: [id number].  --Osimertinib is a promising treatment option for EGFR-mutated NSCLC with LM regardless of T790M mutational status.

## 2020-12-26 ENCOUNTER — Telehealth: Payer: Self-pay | Admitting: Hematology and Oncology

## 2020-12-26 NOTE — Telephone Encounter (Signed)
Scheduled appts per 3/17 los. Called pt, no answer. Left vm with appts date and times.

## 2020-12-29 ENCOUNTER — Other Ambulatory Visit: Payer: Self-pay | Admitting: Family Medicine

## 2020-12-30 ENCOUNTER — Telehealth: Payer: Self-pay

## 2020-12-30 ENCOUNTER — Encounter: Payer: Self-pay | Admitting: Family Medicine

## 2020-12-30 ENCOUNTER — Other Ambulatory Visit: Payer: Self-pay | Admitting: Family Medicine

## 2020-12-30 ENCOUNTER — Other Ambulatory Visit (HOSPITAL_COMMUNITY): Payer: Self-pay

## 2020-12-30 DIAGNOSIS — C3490 Malignant neoplasm of unspecified part of unspecified bronchus or lung: Secondary | ICD-10-CM | POA: Diagnosis not present

## 2020-12-30 DIAGNOSIS — J449 Chronic obstructive pulmonary disease, unspecified: Secondary | ICD-10-CM | POA: Diagnosis not present

## 2020-12-30 DIAGNOSIS — R0902 Hypoxemia: Secondary | ICD-10-CM | POA: Diagnosis not present

## 2020-12-30 DIAGNOSIS — J9621 Acute and chronic respiratory failure with hypoxia: Secondary | ICD-10-CM | POA: Diagnosis not present

## 2020-12-30 MED ORDER — FLUTICASONE PROPIONATE 50 MCG/ACT NA SUSP: 2.0000 | Freq: Every day | NASAL | 6 refills | Status: DC

## 2021-01-02 ENCOUNTER — Telehealth: Payer: Self-pay | Admitting: Family Medicine

## 2021-01-02 NOTE — Telephone Encounter (Signed)
Call placed to patient. LMTRC.  

## 2021-01-02 NOTE — Telephone Encounter (Signed)
error 

## 2021-01-02 NOTE — Telephone Encounter (Signed)
Patient left a voice message this morning to request a call back; has questions about upper respiratory issues. Please advise at [phone].

## 2021-01-03 ENCOUNTER — Encounter: Payer: Self-pay | Admitting: Family Medicine

## 2021-01-03 IMAGING — MR MRI HEAD WITHOUT CONTRAST
15 of 17 series · 38 of 48 positions shown · IV contrast (gadavist)
Comparison: Prior CT from earlier the same day.

CLINICAL DATA: Initial evaluation for acute aphasia, right-sided
weakness, confusion.



[Series 5: DWI · axial · 3.0mm · 0.88mm/px · z∈[-62,+75]mm · 3 of 94 slices shown (1 of 4)]
[im 1/94]
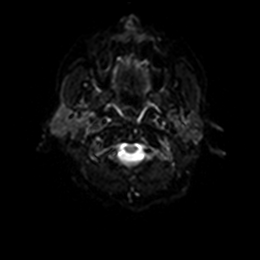
[im 47/94]
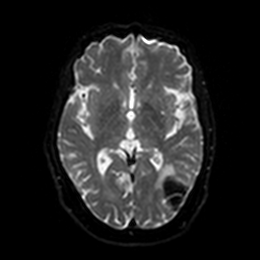
[im 94/94]
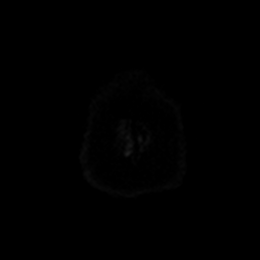

[Series 6: DWI · axial · 3.0mm · 0.88mm/px · z∈[-62,+75]mm · 2 of 47 slices shown (2 of 4)]
[im 1/47]
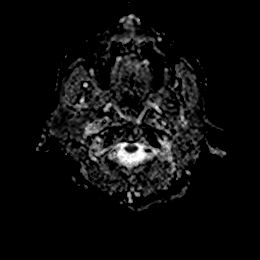
[im 47/47]
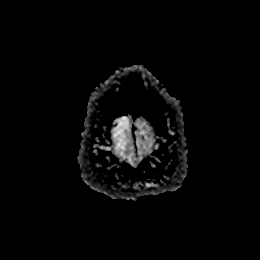

[Series 11: DWI · coronal · 4.0mm · 0.88mm/px · 3 of 70 slices shown (3 of 4)]
[im 1/70]
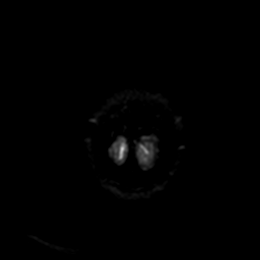
[im 35/70]
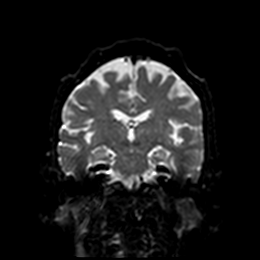
[im 70/70]
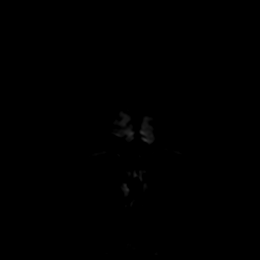

[Series 12: DWI · coronal · 4.0mm · 0.88mm/px · 2 of 35 slices shown (4 of 4)]
[im 1/35]
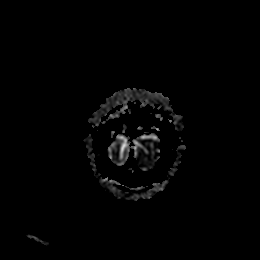
[im 35/35]
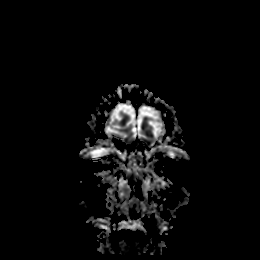

[Series 13: T1 · sagittal · 5.0mm · 0.75mm/px · 1 of 23 slices shown]
[im 1/23]
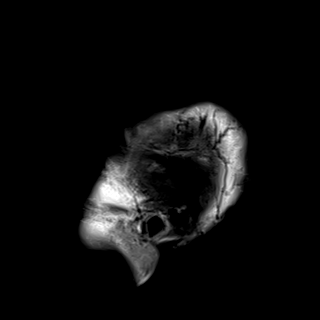

[Series 14: T2 · axial · 5.0mm · 0.72mm/px · 1 of 25 slices shown (1 of 2)]
[im 1/25]
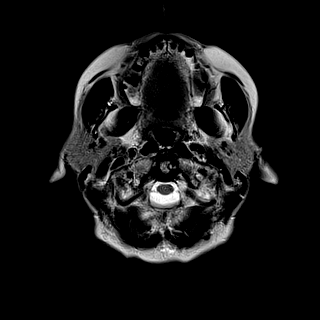

[Series 15: FLAIR · axial · 5.0mm · 0.45mm/px · 1 of 25 slices shown]
[im 1/25]
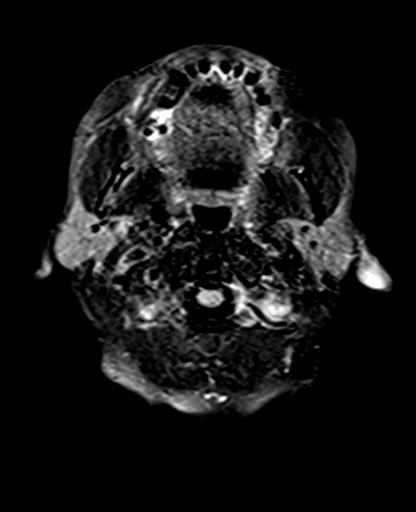

[Series 16: mag_images · axial · 3.0mm · 0.90mm/px · z∈[-74,+101]mm · 3 of 60 slices shown]
[im 1/60]
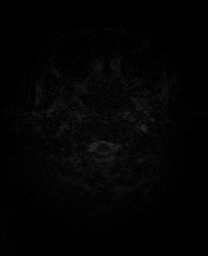
[im 30/60]
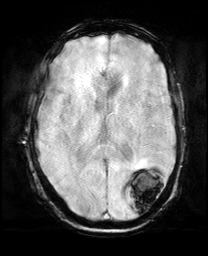
[im 60/60]
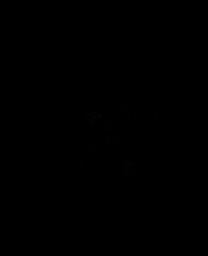

[Series 17: pha_images · axial · 3.0mm · 0.90mm/px · z∈[-74,+98]mm · 2 of 58 slices shown]
[im 1/58]
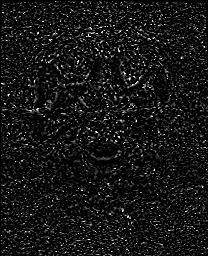
[im 58/58]
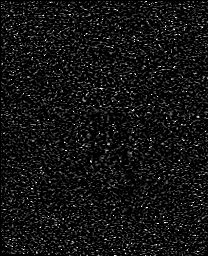

[Series 18: swi_images · axial · 3.0mm · 0.90mm/px · z∈[-74,+101]mm · 3 of 60 slices shown]
[im 1/60]
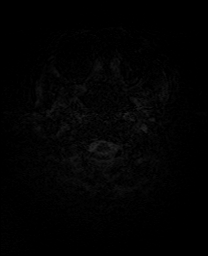
[im 30/60]
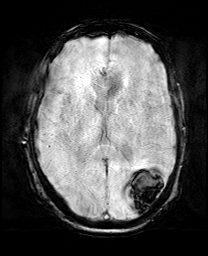
[im 60/60]
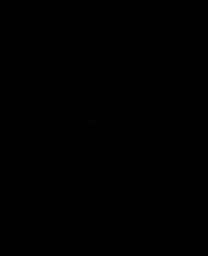

[Series 19: mip_images(sw) · axial · 24.0mm · 0.90mm/px · z∈[-63,+91]mm · 2 of 53 slices shown]
[im 1/53]
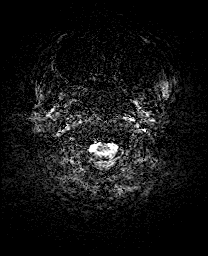
[im 53/53]
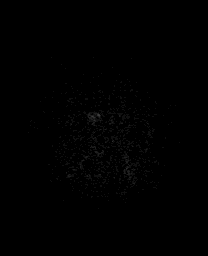

[Series 21: T2 · coronal · 5.0mm · 0.72mm/px · 1 of 28 slices shown (2 of 2)]
[im 1/28]
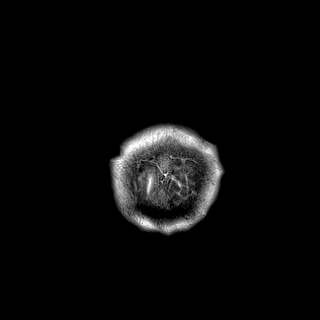

[Series 28: tof_fl3d_tra_iso · axial · 0.6mm · 0.52mm/px · z∈[-149,-70]mm · 6 of 133 slices shown]
[im 1/133]
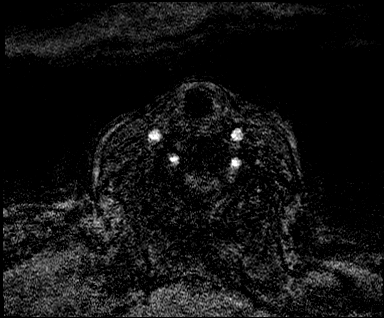
[im 27/133]
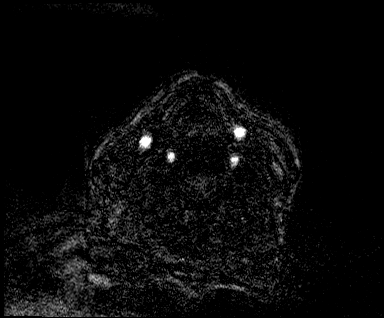
[im 53/133]
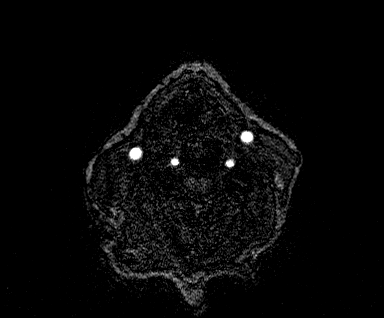
[im 80/133]
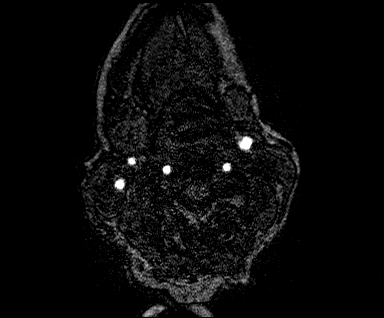
[im 106/133]
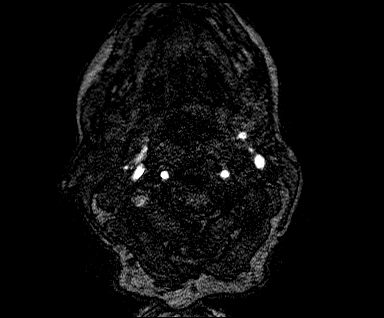
[im 133/133]
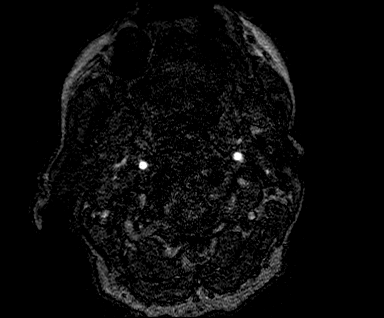

[Series 36: angio_fl3d_cor_post_ttc=3.0s_moco-adv_sub · coronal · 0.9mm · 0.85mm/px · 4 of 88 slices shown (1 of 2)]
[im 1/88]
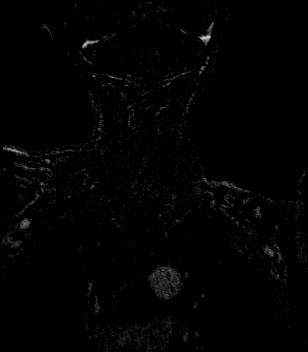
[im 30/88]
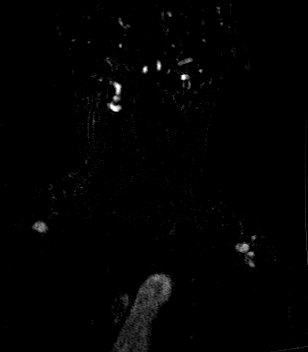
[im 59/88]
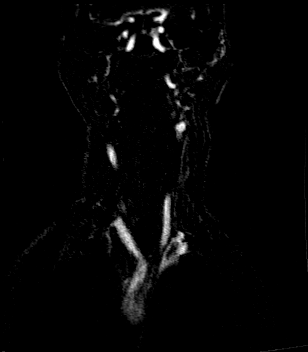
[im 88/88]
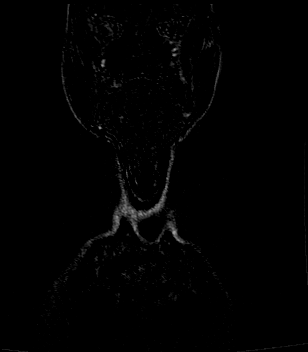

[Series 40: angio_fl3d_cor_post_ttc=3.0s_moco-adv_sub · coronal · 0.9mm · 0.85mm/px · 4 of 88 slices shown (2 of 2)]
[im 1/88]
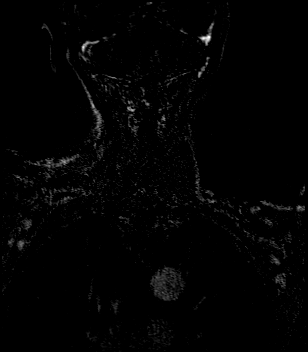
[im 30/88]
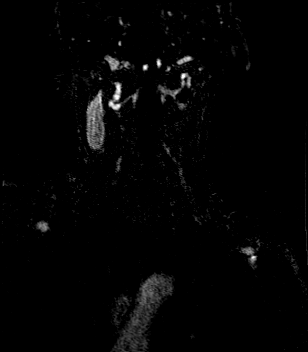
[im 59/88]
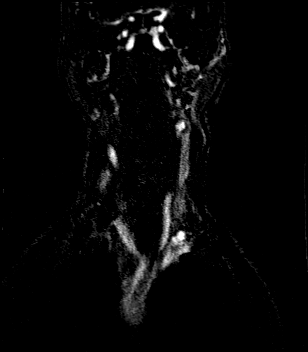
[im 88/88]
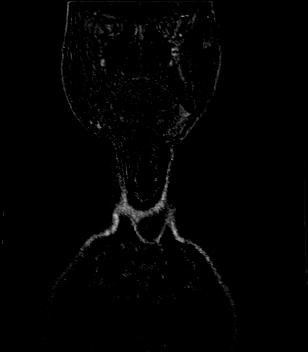

[38 of 48 positions shown; findings below may reference images not displayed]

FINDINGS: MRI HEAD FINDINGS

Cerebral volume within normal limits for age. Minimal T2/FLAIR
hyperintensity noted within the periventricular white matter,
nonspecific, felt to be within normal limits for age.

Previously identified intraparenchymal hemorrhage centered at the
left parietal lobe again seen, measuring 2.9 x 4.0 x 4.3 cm
(estimated volume 25 cc). Surrounding vasogenic edema with mild
regional mass effect. No midline shift. No intraventricular or
extra-axial extension. No discernible underlying mass lesion or
other abnormality. No significant [place] to suggest cerebral
amyloid angiopathy. No findings to suggest overlying trauma.
Etiology unclear.

No other abnormal foci of restricted diffusion to suggest acute or
subacute ischemia. Gray-white matter differentiation otherwise
maintained. No encephalomalacia to suggest chronic cortical
infarction. No other acute or chronic intracranial hemorrhage.

No mass lesion or midline shift. No hydrocephalus. No extra-axial
fluid collection. Pituitary gland suprasellar region normal. Midline
structures intact.

Major intracranial vascular flow voids maintained.

Craniocervical junction normal. No focal marrow replacing lesion.
Scalp soft tissues unremarkable.

Patient status post bilateral ocular lens replacement. Paranasal
sinuses are clear. Small left mastoid effusion noted, of doubtful
significance.

MRA HEAD FINDINGS

ANTERIOR CIRCULATION:

Examination moderately degraded by motion artifact.

Distal cervical segments of the internal carotid arteries are patent
with symmetric antegrade flow. Distal cervical left ICA tortuous.
Petrous, cavernous, and supraclinoid segments patent without
appreciable stenosis. A1 segments patent bilaterally. Anterior
cerebral arteries patent to their distal aspects without definite
stenosis. Anterior communicating artery not well assessed on this
motion degraded exam. No M1 occlusion. Possible short-segment mild
distal left M1 stenosis noted (series 8453, image 11). Normal MCA
bifurcations. Distal MCA branches well perfused and grossly
symmetric. No vascular abnormality seen underlying the partially
visualized left parietal hematoma.

POSTERIOR CIRCULATION:

Vertebral arteries patent to the vertebrobasilar junction without
stenosis. Vertebral arteries largely code dominant. Basilar widely
patent to its distal aspect. Superior cerebral arteries patent
bilaterally. Both of the posterior cerebral arteries primarily
supplied via the basilar and are well perfused to their distal
aspects. No definite flow-limiting stenosis. Prominent left
posterior communicating artery noted.

No visible aneurysm or other vascular abnormality on this motion
degraded exam.

MRA NECK FINDINGS

Examination technically limited by motion artifact.

Visualized aortic arch of normal caliber with normal 3 vessel
morphology. No flow-limiting stenosis about the origin of the great
vessels. Visualized subclavian arteries widely patent.

Right common carotid artery widely patent from its origin to the
bifurcation. Mild atheromatous irregularity about the right
bifurcation without hemodynamically significant stenosis. Right ICA
patent distally to the circle-of-Willis without stenosis or
occlusion.

Left common carotid artery patent from its origin to the bifurcation
without stenosis. No significant atheromatous narrowing about the
left bifurcation. Left ICA tortuous but patent to the
circle-of-Willis without stenosis or occlusion.

Both of the vertebral arteries arise from the subclavian arteries.
Vertebral arteries largely code dominant. Probable short-segment
moderate stenosis at the origin of the left vertebral artery (series
8695, image 14). Vertebral arteries otherwise widely patent within
the neck without stenosis or occlusion.
IMPRESSION: MRI HEAD IMPRESSION:

1. Stable size of acute intraparenchymal hemorrhage centered at the
left parietal convexity, estimated volume 25 cc. Surrounding
vasogenic edema with mild regional mass effect without midline
shift. No identifiable etiology identified on this exam.
2. Otherwise normal brain MRI for age.

MRA HEAD IMPRESSION:

1. Technically limited exam due to moderate motion degradation.
2. Grossly negative intracranial MRA. No large vessel occlusion,
hemodynamically significant stenosis, or other acute vascular
process. No vascular abnormality seen underlying the left parietal
hemorrhage.

MRA NECK IMPRESSION:

1. Wide patency of the carotid artery systems bilaterally within the
neck.
2. Short-segment moderate stenosis at the origin of the left
vertebral artery. Vertebral arteries otherwise widely patent within
the neck. Vertebral arteries are co dominant.

## 2021-01-03 NOTE — Telephone Encounter (Signed)
Patient sent MyChart to discuss with PCP.

## 2021-01-04 ENCOUNTER — Other Ambulatory Visit: Payer: Self-pay | Admitting: Radiation Therapy

## 2021-01-04 IMAGING — CT CT HEAD WITHOUT CONTRAST
4 series · 17 of 47 positions shown, 19 images · non-contrast
Comparison: Brain MRI from yesterday

CLINICAL DATA: Intracranial hemorrhage follow-up

EXAM:
CT HEAD WITHOUT CONTRAST
TECHNIQUE: Contiguous axial images were obtained from the base of the skull
through the vertex without intravenous contrast.

[Series 3: head without · axial · non-contrast · 0.43mm/px · z∈[-104,+21]mm · 7 of 35 slices shown, 9 images]
[im 5/35  brain]
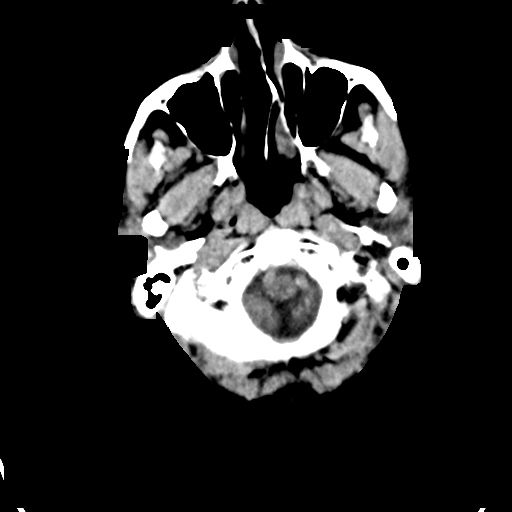
[im 5/35  bone]
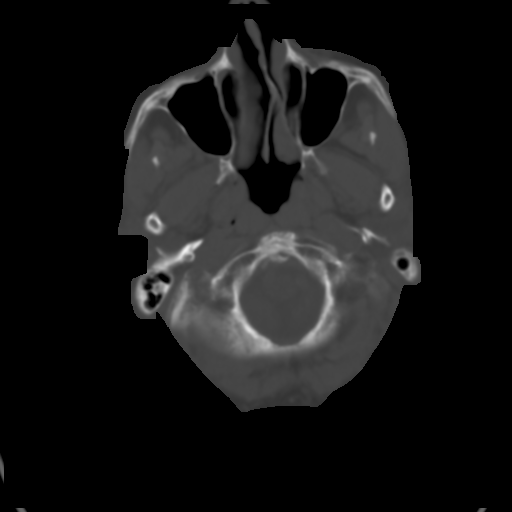
[im 9/35  brain]
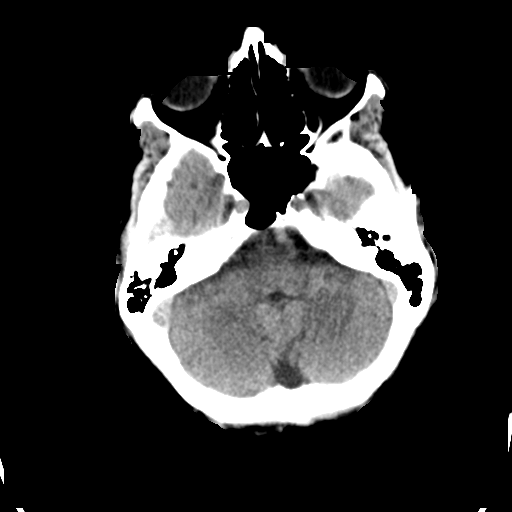
[im 13/35  brain]
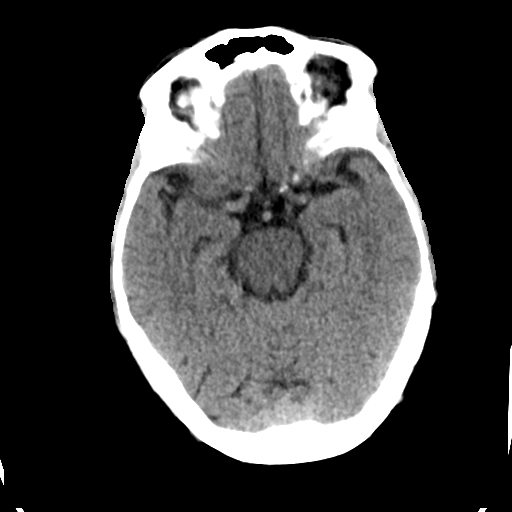
[im 18/35  brain]
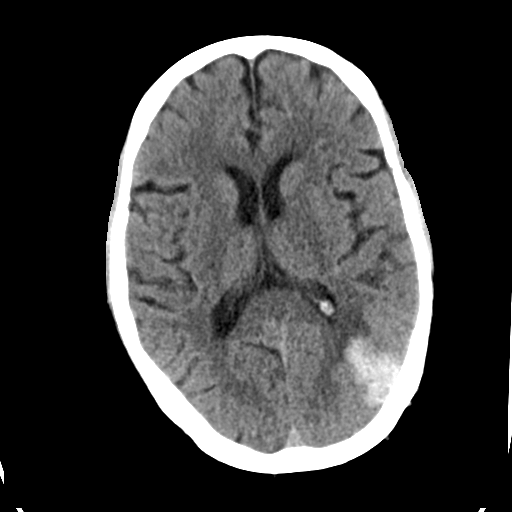
[im 22/35  brain]
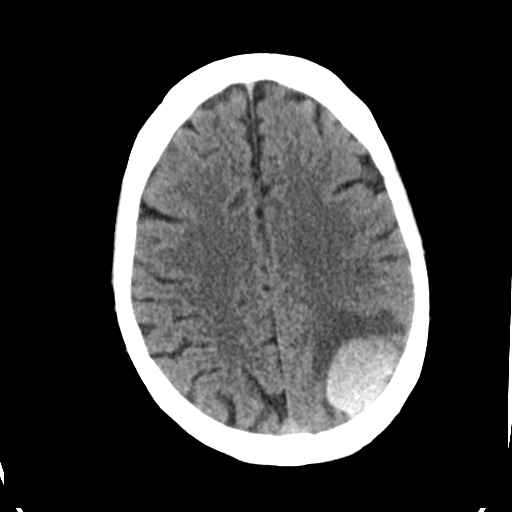
[im 22/35  bone]
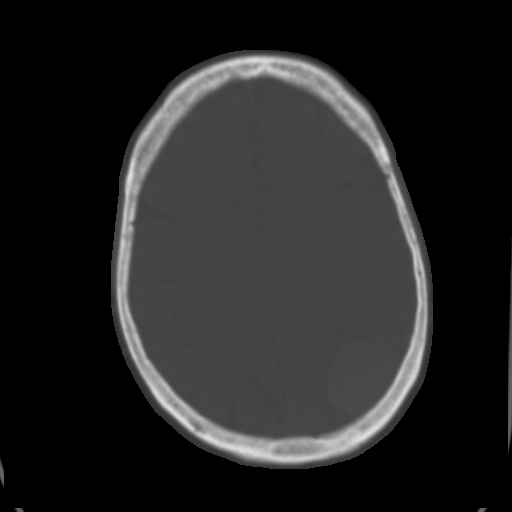
[im 26/35  brain]
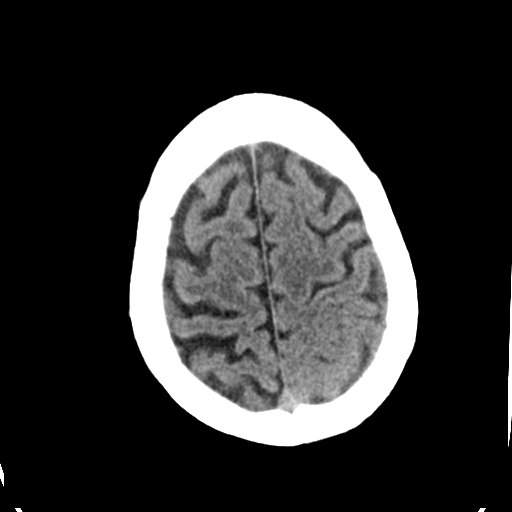
[im 30/35  brain]
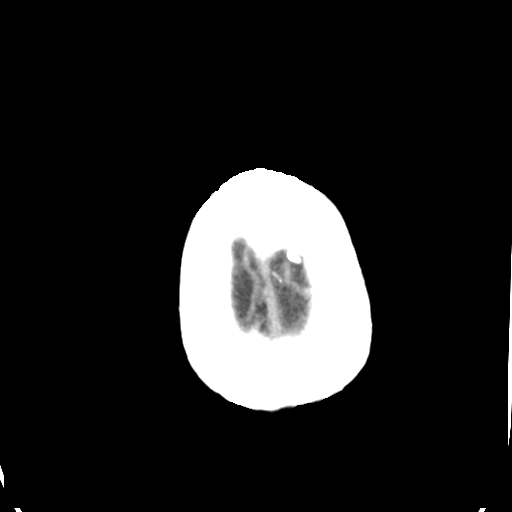

[Series 4: head bone · axial · 0.43mm/px · z∈[-108,-48]mm · 4 of 87 slices shown]
[im 9/87  bone]
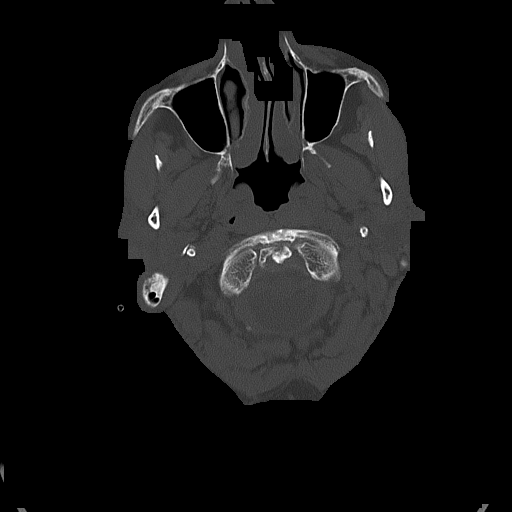
[im 18/87  bone]
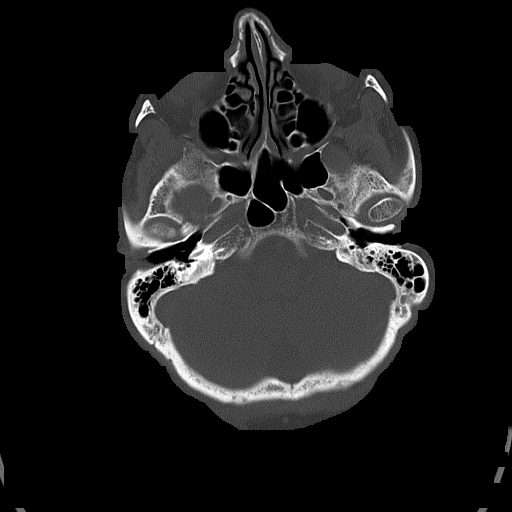
[im 26/87  bone]
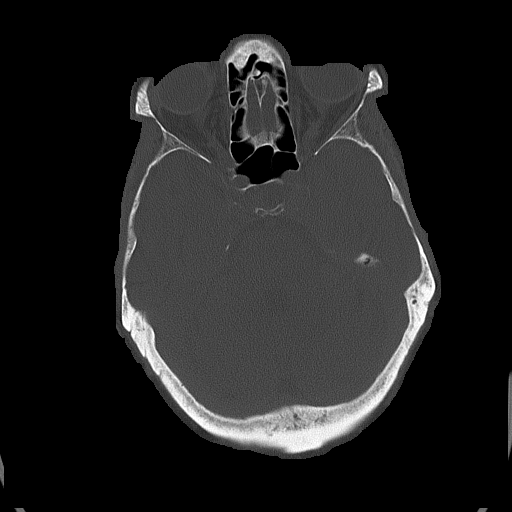
[im 39/87  bone]
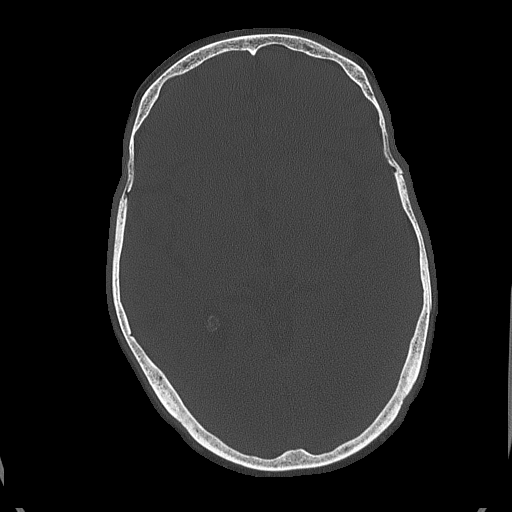

[Series 5: head without cor · coronal · non-contrast · 0.32mm/px · 3 of 73 slices shown]
[im 25/73  brain]
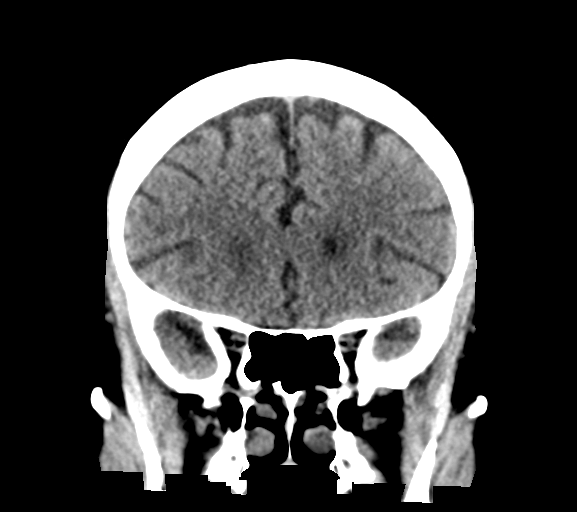
[im 33/73  brain]
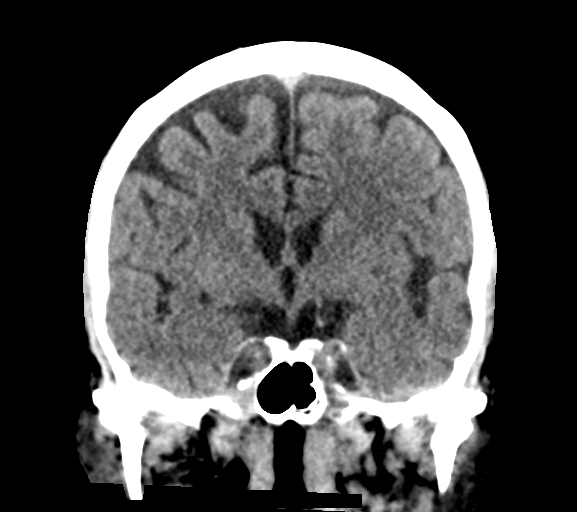
[im 41/73  brain]
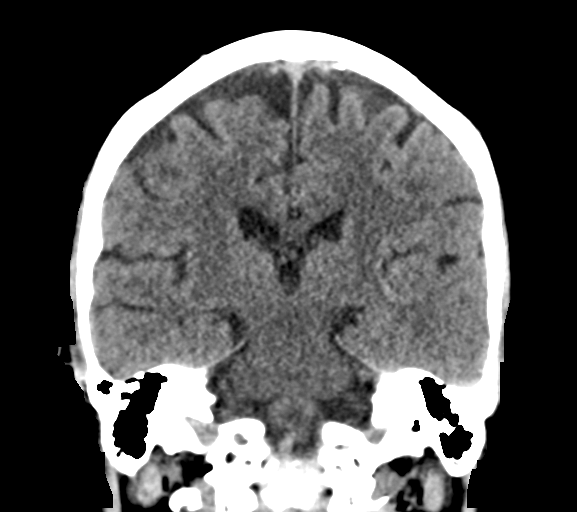

[Series 6: head without sag · sagittal · non-contrast · 0.34mm/px · 3 of 65 slices shown]
[im 22/65  brain]
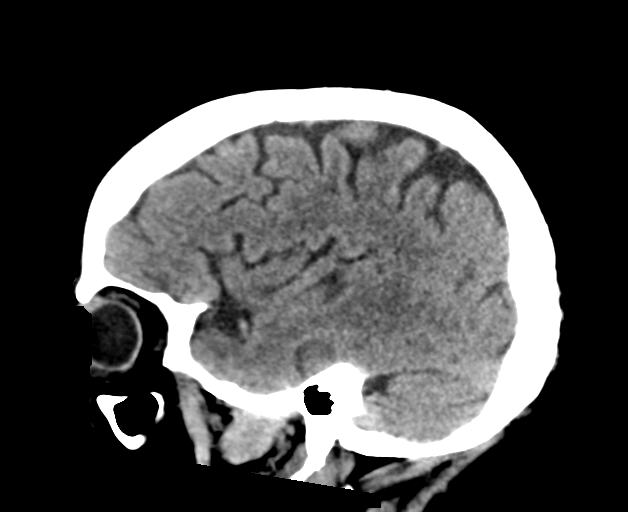
[im 33/65  brain]
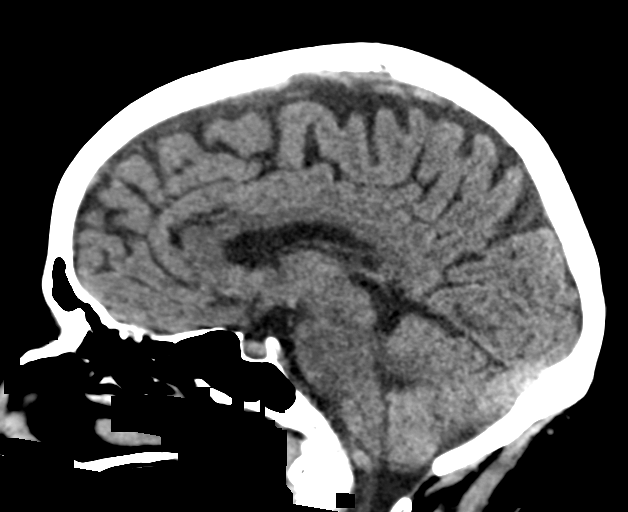
[im 43/65  brain]
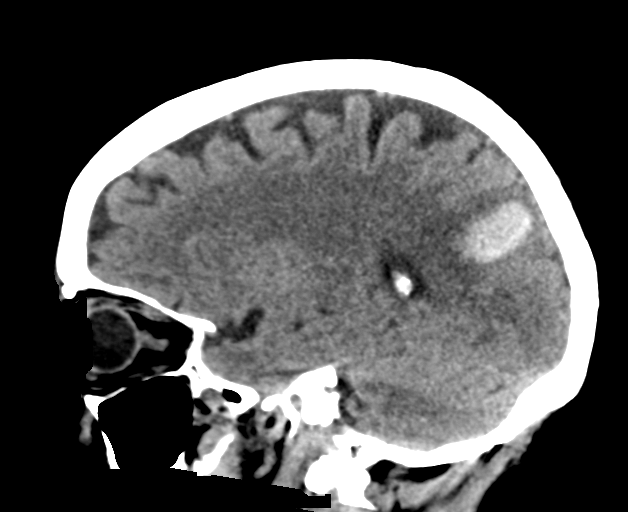

[17 of 47 positions shown; findings below may reference images not displayed]

FINDINGS: Brain: Up to 4.2 Cm (craniocaudal) parenchymal hematoma in the left
parietal lobe that is unchanged in extent when compared to brain MRI
yesterday. There is a rim of edema that is also stable. Local
swelling without midline shift. No hydrocephalus or extra-axial
collection. No visible infarct

Vascular: Atherosclerotic calcification

Skull: No acute or aggressive finding

Sinuses/Orbits: Bilateral cataract resection.
IMPRESSION: Size stable left parietal hematoma with edema. No new abnormality or
midline shift.

## 2021-01-06 ENCOUNTER — Telehealth: Payer: Self-pay | Admitting: Pharmacist

## 2021-01-06 IMAGING — CT CT ABDOMEN AND PELVIS WITHOUT CONTRAST
2 of 4 series · 14 of 46 positions shown, 16 images · non-contrast
Comparison: CT chest 05/06/2018 and 05/09/2017.

CLINICAL DATA: Acute onset generalized pain today.

EXAM:
CT CHEST, ABDOMEN AND PELVIS WITHOUT CONTRAST
TECHNIQUE: Multidetector CT imaging of the chest, abdomen and pelvis was
performed following the standard protocol without IV contrast.

[Series 3: cap wo 5.0 i31f 2 · axial · 0.85mm/px · z∈[+948,+1493]mm · 11 of 127 slices shown, 13 images]
[im 9/127  soft-tissue]
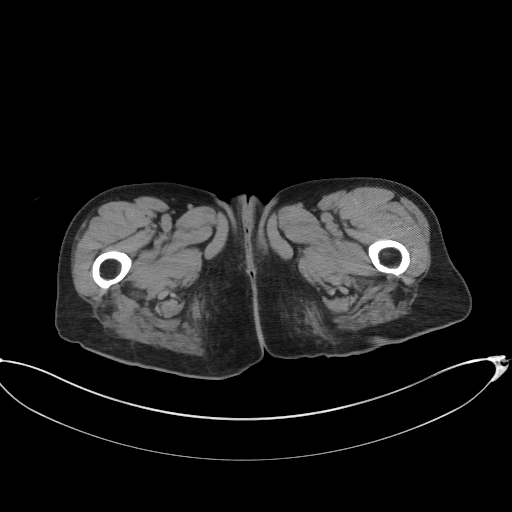
[im 9/127  bone]
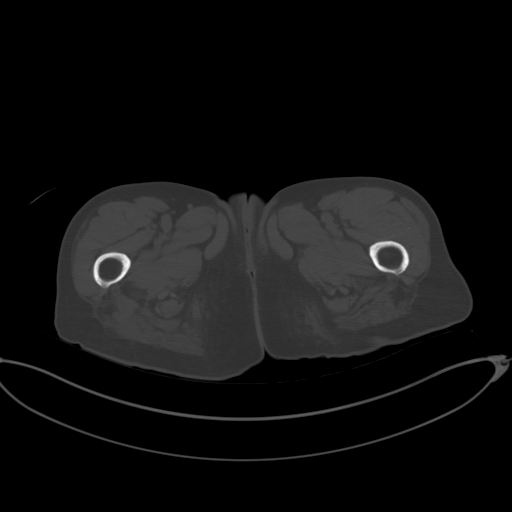
[im 17/127  soft-tissue]
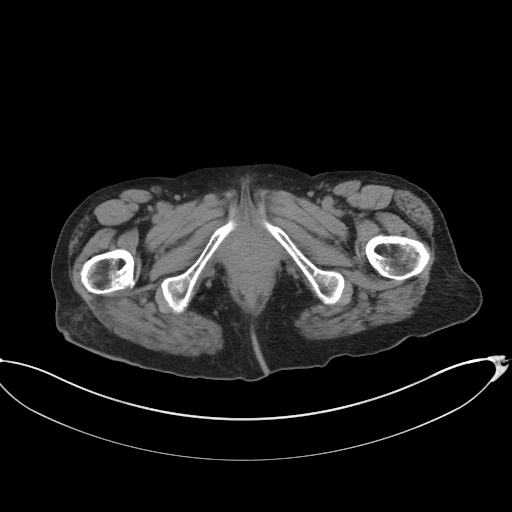
[im 34/127  soft-tissue]
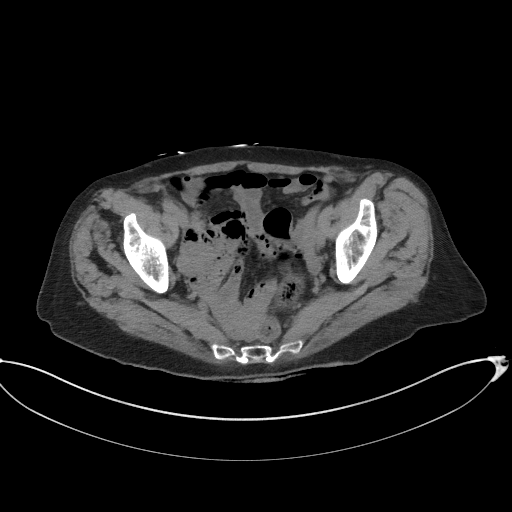
[im 43/127  soft-tissue]
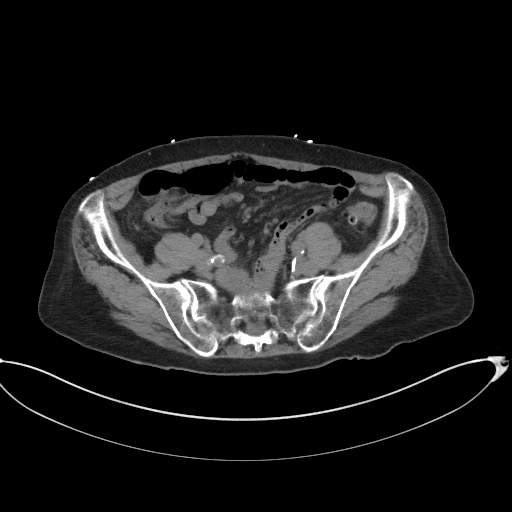
[im 51/127  soft-tissue]
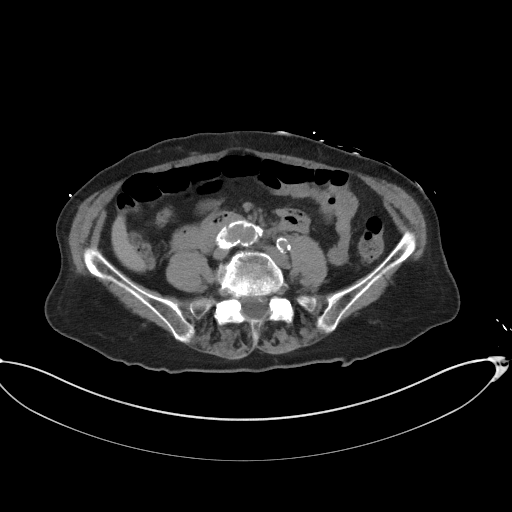
[im 68/127  soft-tissue]
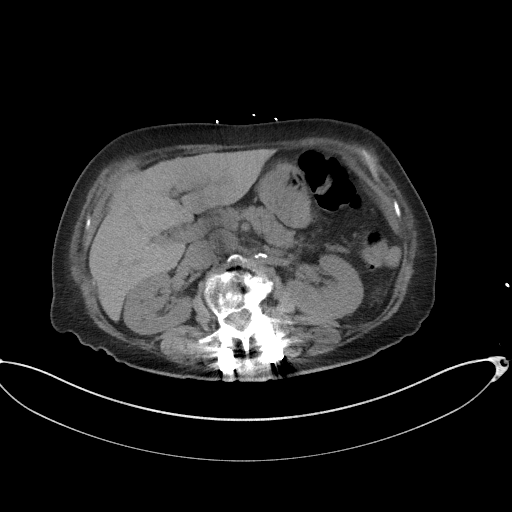
[im 76/127  soft-tissue]
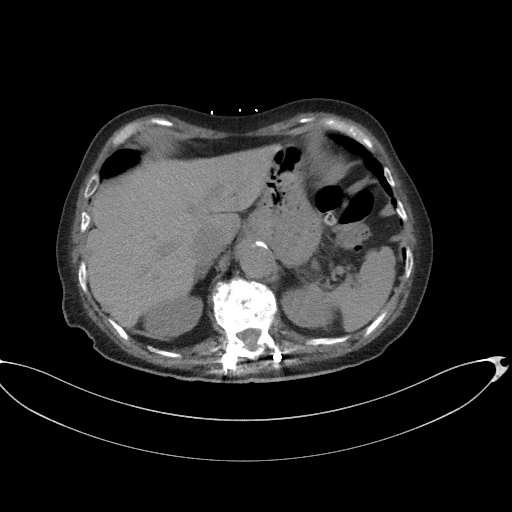
[im 85/127  soft-tissue]
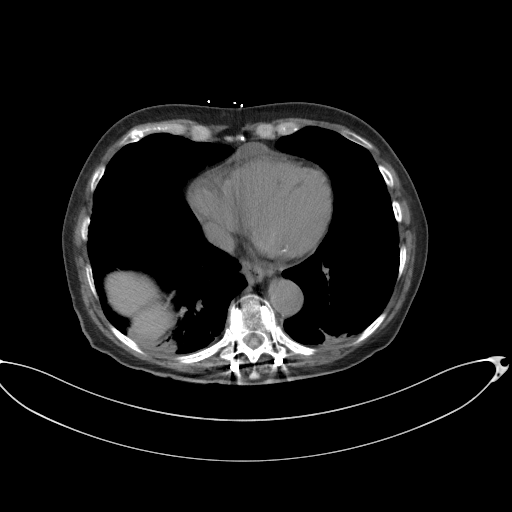
[im 93/127  soft-tissue]
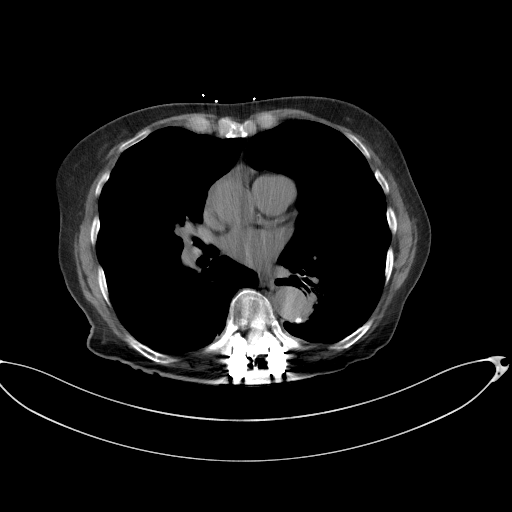
[im 93/127  bone]
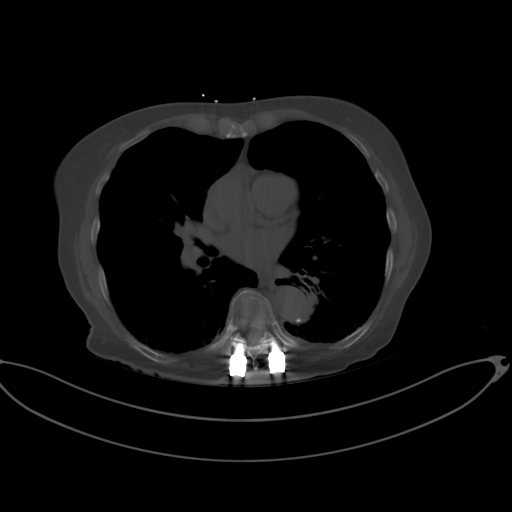
[im 110/127  soft-tissue]
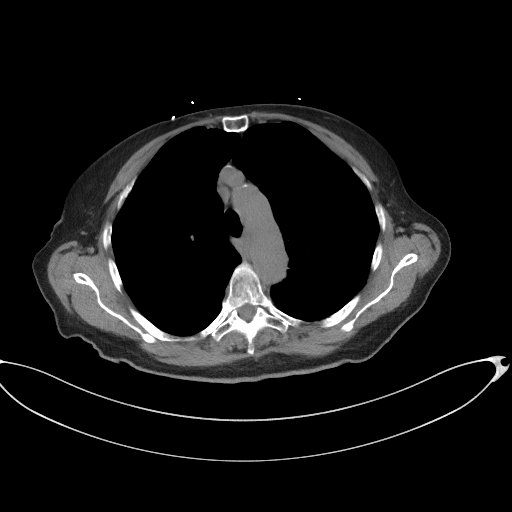
[im 118/127  soft-tissue]
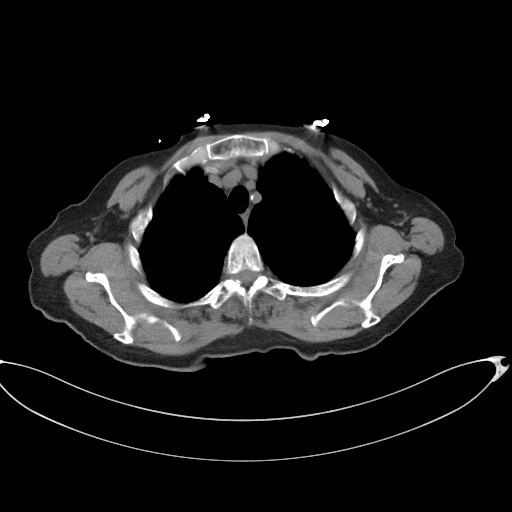

[Series 6: coronal · coronal · 0.84mm/px · 3 of 137 slices shown]
[im 46/137  soft-tissue]
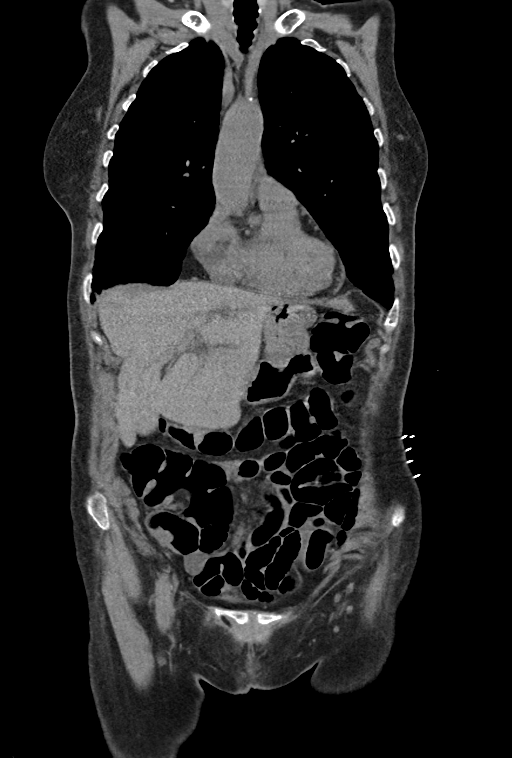
[im 61/137  soft-tissue]
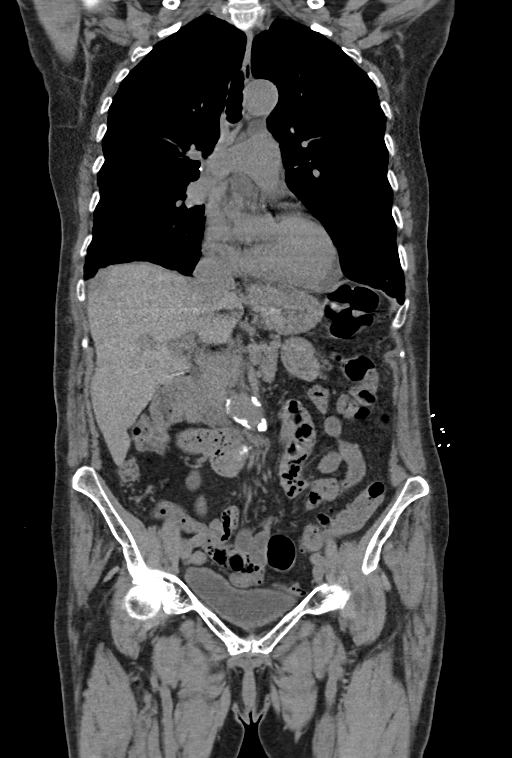
[im 76/137  soft-tissue]
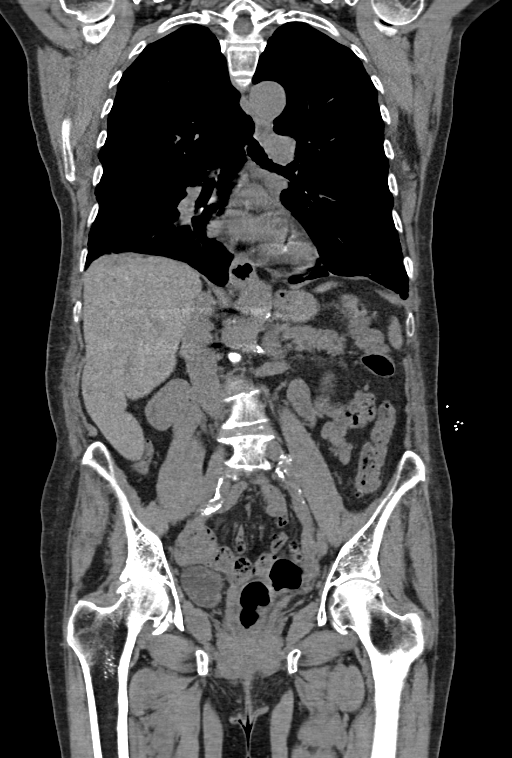

[14 of 46 positions shown; findings below may reference images not displayed]

FINDINGS: CT CHEST FINDINGS

Cardiovascular: Heart size is normal. No pericardial effusion.
Calcific aortic atherosclerosis noted.

Mediastinum/Nodes: No enlarged mediastinal, hilar, or axillary lymph
nodes. Thyroid gland, trachea, and esophagus demonstrate no
significant findings.

Lungs/Pleura: Extensive centrilobular emphysematous disease is
identified. A 0.4 cm nodule in the superior segment of the right
lower lobe on image 65 is unchanged. No new or enlarging pulmonary
nodule is identified. There is some dependent airspace disease which
is greater on the right. Suture material in the left lower lobe
noted.

Musculoskeletal: Severe T6 compression fracture and superior
endplate compression fracture T8 are unchanged. Pedicle screws and
stabilization bars from T10-L2 noted. Severe T12 and L1 compression
fractures are unchanged. No new fracture identified. No lytic or
sclerotic lesion.

CT ABDOMEN PELVIS FINDINGS

Hepatobiliary: No focal liver abnormality is seen. Status post
cholecystectomy. No biliary dilatation.

Pancreas: Unremarkable. No pancreatic ductal dilatation or
surrounding inflammatory changes.

Spleen: Normal in size without focal abnormality.

Adrenals/Urinary Tract: Punctate parenchymal calcification the right
kidney is unchanged. The kidneys otherwise appear. Ureters and
urinary are unremarkable. Adrenal glands are.

Stomach/Bowel: Stomach is within normal limits. Appendix appears
normal. No evidence of bowel wall thickening, distention, or
inflammatory changes. Scattered diverticulosis noted.

Vascular/Lymphatic: Extensive atherosclerotic vascular disease is
present. The descending abdominal aorta measures up to 3.4 cm in
diameter and the right common iliac artery measures 1.4 cm.

Reproductive: Status post hysterectomy. No adnexal masses.

Other: None.

Musculoskeletal: There is a mild compression fracture deformity of
L3 which appears remote. There is convex right lumbar scoliosis and
multilevel degenerative change. Advanced right hip osteoarthritis is
noted.
IMPRESSION: Dependent bibasilar airspace disease is greater on the right and
likely represents atelectasis but could be secondary to pneumonia.
No other finding to suggest acute abnormality is seen in the chest,
abdomen or pelvis.

Emphysema.

Atherosclerosis. 3.4 cm abdominal aortic aneurysm is identified.
Recommend followup by ultrasound in 3 years. This recommendation
follows ACR consensus guidelines: White Paper of the ACR Incidental
Findings Committee II on Vascular Findings. [HOSPITAL] 6666;
[DATE].

Diverticulosis diverticulitis.

Remote thoracic and lumbar spine compression fractures.

## 2021-01-06 NOTE — Progress Notes (Addendum)
Chronic Care Management Pharmacy Assistant   Name: [name]  MRN: [id number] DOB: July 03, 1941  Reason for Encounter: General Disease State Call   Conditions to be addressed/monitored: Hypertension, hyperlipidemia, hypothyroidism COPD, Brain metastasis, osteoporosis  Recent office visits:  None since 11/15/20  Recent consult visits:  12/22/20 Oncology Ledell Peoples IV, MD.  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  11/21/20 Oncology Orson Slick, MD. 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   Hospital visits:  None in previous 6 months  Medications: Outpatient Encounter Medications as of 01/06/2021  Medication Sig   albuterol (VENTOLIN HFA) 108 (90 Base) MCG/ACT inhaler INHALE 2 PUFFS EVERY 6 HOURS AS NEEDED FOR WHEEZING OR SHORTNESS OF BREATH.   ALPRAZolam (XANAX) 0.5 MG tablet TAKE (1) TABLET BY MOUTH TWICE A DAY AS NEEDED.   cephALEXin (KEFLEX) 500 MG capsule Take 1 capsule (500 mg total) by mouth 3 (three) times daily.   citalopram (CELEXA) 40 MG tablet TAKE (1/2) TABLET BY MOUTH AT BEDTIME.   diphenhydrAMINE (BENADRYL) 50 MG tablet Take 1 tablet (50 mg total) by mouth once for 1 dose. Take one hour prior to CT scan   ferrous sulfate 325 (65 FE) MG tablet Take 325 mg by mouth daily with breakfast.   fluticasone (FLONASE) 50 MCG/ACT nasal spray Place 2 sprays into both nostrils daily.   fluticasone (FLONASE) 50 MCG/ACT nasal spray Place 2 sprays into both nostrils daily.   Fluticasone-Umeclidin-Vilant (TRELEGY ELLIPTA) 100-62.5-25 MCG/INH AEPB INHALE 1 PUFF INTO LUNGS DAILY.   gabapentin (NEURONTIN) 100 MG capsule TAKE 1 CAPSULE BY MOUTH THREE TIMES A DAY.   levocetirizine (XYZAL) 5 MG tablet TAKE ONE TABLET BY MOUTH ONCE DAILY.   levothyroxine (SYNTHROID) 50 MCG tablet TAKE 1 TABLET BEFORE BREAKFAST.   Multiple Vitamin (MULTIVITAMIN WITH MINERALS) TABS tablet Take 1 tablet by  mouth daily.   ondansetron (ZOFRAN) 4 MG tablet TAKE 1 TABLET BY MOUTH 3 TIMES DAILY AS NEEDED FOR NAUSEA AND VOMITING.   OVER THE COUNTER MEDICATION Bone Support 2-4 tabs daily   OXcarbazepine (TRILEPTAL) 150 MG tablet TAKE (1) TABLET BY MOUTH TWICE DAILY.   pantoprazole (PROTONIX) 40 MG tablet TAKE (1) TABLET BY MOUTH ONCE DAILY BEFORE BREAKFAST.   predniSONE (DELTASONE) 50 MG tablet Please take one tablet at 13 hours, 7 hours and one hour prior to CT scan   Probiotic Product (ALIGN PO) Take by mouth daily.   PROLIA 60 MG/ML SOSY injection INJECT INTO UPPER ARM, THIGH, OR ABDOMEN ONCE.   TAGRISSO 80 MG tablet TAKE 1 TABLET (80 MG TOTAL) BY MOUTH DAILY.   No facility-administered encounter medications on file as of 01/06/2021.   GEN CALL: Patient stated she did get the [place] cream but she is now using this cream that is for horses but works on [employer], [employer] as well. She was unable to give me the actual name. She stated it seems to be working for her. She stated she's been doing better then she has in a long while. She stated she is experiencing some sinus concerns she stated Dr. Dennard Schaumann  prescribed her Flonase and an inhaler Albuterol but she is still experiencing some sinus issues. She wanted to know if chris had any ideas of any OTC sinus medications that might help her. She stated her appetite is not that great but she makes herself when she knows its time to each and she stated she eats  meals on wheels.   Star Rating Drugs: None   Follow-Up:Pharmacist Review  Charlann Lange, [place] Pharmacist Assistant [phone]  Patient already trying almost everything OTC she can.  Recommended saline nasal spray to try and clean out nasal passages.    20 minutes spent in review, coordination, and documentation.  Reviewed by: Beverly Milch, PharmD Clinical Pharmacist [facility] [phone]

## 2021-01-07 ENCOUNTER — Other Ambulatory Visit: Payer: Self-pay | Admitting: Family Medicine

## 2021-01-08 ENCOUNTER — Other Ambulatory Visit (HOSPITAL_COMMUNITY): Payer: Self-pay

## 2021-01-08 MED FILL — Osimertinib Mesylate Tab 80 MG (Base Equivalent): ORAL | 30 days supply | Qty: 30 | Fill #0 | Status: CN

## 2021-01-09 ENCOUNTER — Other Ambulatory Visit (HOSPITAL_COMMUNITY): Payer: Self-pay

## 2021-01-09 ENCOUNTER — Telehealth: Payer: Self-pay | Admitting: Family Medicine

## 2021-01-09 ENCOUNTER — Telehealth: Payer: Self-pay | Admitting: *Deleted

## 2021-01-09 ENCOUNTER — Encounter: Payer: Self-pay | Admitting: Family Medicine

## 2021-01-09 MED FILL — Osimertinib Mesylate Tab 80 MG (Base Equivalent): ORAL | 30 days supply | Qty: 30 | Fill #0 | Status: AC

## 2021-01-09 NOTE — Telephone Encounter (Signed)
Patient found a tick attached to the skin in her groin area yesterday. Patient asking for guidance. Please advise at [phone].

## 2021-01-09 NOTE — Telephone Encounter (Signed)
Call placed to patient.   Reports that she noted tick attached to inner groin. States that she removed the tick in entirety, but has noted that she has hard knot like area under where tick was attached.   Advise to use Ice pack intermittently to reduce swelling. Also advised to monitor for redness or irritation. If redness/ irritation noted, advised to schedule appointment for evaluation.

## 2021-01-09 NOTE — Telephone Encounter (Signed)
  Received call from patient. She states that one of her fingernails has started to come lose. She is asking if she should stop the Tagrisso.  Her hands overall are doing ok. Not as bad as they have been in the past.  Advised to continue the Tagrisso and that Dr. [name] can evaluate her situation when she comes in next week. Pt is agreeable to this plan.  Advised to call back with any other changes

## 2021-01-13 ENCOUNTER — Other Ambulatory Visit (HOSPITAL_COMMUNITY): Payer: Self-pay

## 2021-01-16 ENCOUNTER — Other Ambulatory Visit (HOSPITAL_COMMUNITY): Payer: Self-pay

## 2021-01-16 ENCOUNTER — Encounter: Payer: Self-pay | Admitting: Hematology and Oncology

## 2021-01-17 ENCOUNTER — Other Ambulatory Visit (HOSPITAL_COMMUNITY): Payer: Self-pay

## 2021-01-19 ENCOUNTER — Inpatient Hospital Stay: Payer: PPO | Attending: Internal Medicine

## 2021-01-19 ENCOUNTER — Other Ambulatory Visit: Payer: Self-pay

## 2021-01-19 ENCOUNTER — Other Ambulatory Visit: Payer: Self-pay | Admitting: Hematology and Oncology

## 2021-01-19 ENCOUNTER — Inpatient Hospital Stay (HOSPITAL_BASED_OUTPATIENT_CLINIC_OR_DEPARTMENT_OTHER): Payer: PPO | Admitting: Hematology and Oncology

## 2021-01-19 VITALS — BP 117/78 | HR 76 | Temp 97.6°F | Resp 18 | Ht 60.0 in | Wt 114.4 lb

## 2021-01-19 DIAGNOSIS — Z87891 Personal history of nicotine dependence: Secondary | ICD-10-CM | POA: Diagnosis not present

## 2021-01-19 DIAGNOSIS — C3492 Malignant neoplasm of unspecified part of left bronchus or lung: Secondary | ICD-10-CM

## 2021-01-19 DIAGNOSIS — C7931 Secondary malignant neoplasm of brain: Secondary | ICD-10-CM | POA: Diagnosis not present

## 2021-01-19 DIAGNOSIS — C3432 Malignant neoplasm of lower lobe, left bronchus or lung: Secondary | ICD-10-CM | POA: Insufficient documentation

## 2021-01-19 DIAGNOSIS — Z902 Acquired absence of lung [part of]: Secondary | ICD-10-CM | POA: Insufficient documentation

## 2021-01-19 LAB — CBC WITH DIFFERENTIAL (CANCER CENTER ONLY)
Abs Immature Granulocytes: 0.01 10*3/uL (ref 0.00–0.07)
Basophils Absolute: 0 10*3/uL (ref 0.0–0.1)
Basophils Relative: 0 %
Eosinophils Absolute: 0.1 10*3/uL (ref 0.0–0.5)
Eosinophils Relative: 2 %
HCT: 33.7 % — ABNORMAL LOW (ref 36.0–46.0)
Hemoglobin: 11 g/dL — ABNORMAL LOW (ref 12.0–15.0)
Immature Granulocytes: 0 %
Lymphocytes Relative: 15 %
Lymphs Abs: 0.7 10*3/uL (ref 0.7–4.0)
MCH: 29.5 pg (ref 26.0–34.0)
MCHC: 32.6 g/dL (ref 30.0–36.0)
MCV: 90.3 fL (ref 80.0–100.0)
Monocytes Absolute: 0.4 10*3/uL (ref 0.1–1.0)
Monocytes Relative: 9 %
Neutro Abs: 3.5 10*3/uL (ref 1.7–7.7)
Neutrophils Relative %: 74 %
Platelet Count: 207 10*3/uL (ref 150–400)
RBC: 3.73 MIL/uL — ABNORMAL LOW (ref 3.87–5.11)
RDW: 13 % (ref 11.5–15.5)
WBC Count: 4.8 10*3/uL (ref 4.0–10.5)
nRBC: 0 % (ref 0.0–0.2)

## 2021-01-19 LAB — CMP (CANCER CENTER ONLY)
ALT: 15 U/L (ref 0–44)
AST: 20 U/L (ref 15–41)
Albumin: 4 g/dL (ref 3.5–5.0)
Alkaline Phosphatase: 46 U/L (ref 38–126)
Anion gap: 9 (ref 5–15)
BUN: 16 mg/dL (ref 8–23)
CO2: 28 mmol/L (ref 22–32)
Calcium: 9.2 mg/dL (ref 8.9–10.3)
Chloride: 96 mmol/L — ABNORMAL LOW (ref 98–111)
Creatinine: 0.63 mg/dL (ref 0.44–1.00)
GFR, Estimated: >60 mL/min (ref >60)
Glucose, Bld: 87 mg/dL (ref 70–99)
Potassium: 4.8 mmol/L (ref 3.5–5.1)
Sodium: 133 mmol/L — ABNORMAL LOW (ref 135–145)
Total Bilirubin: 0.3 mg/dL (ref 0.3–1.2)
Total Protein: 6.6 g/dL (ref 6.5–8.1)

## 2021-01-19 IMAGING — MR MRI HEAD WITHOUT AND WITH CONTRAST
15 series · 48 of 48 positions shown · IV contrast (gadavist)
Comparison: Spine MRI brain 03/04/2019 and 03/09/2019

CLINICAL DATA: Metastatic disease to the brain. Lung cancer.
Surgical resection or primary lesion yesterday. Preoperative radius
surgery.

EXAM:
MRI HEAD WITHOUT AND WITH CONTRAST
TECHNIQUE: Multiplanar, multiecho pulse sequences of the brain and surrounding
structures were obtained without and with intravenous contrast.
CONTRAST:  6 mL Gadavist

[Series 5: FLAIR · sagittal · 3.0mm · 0.72mm/px · 2 of 34 slices shown (1 of 3)]
[im 1/34]
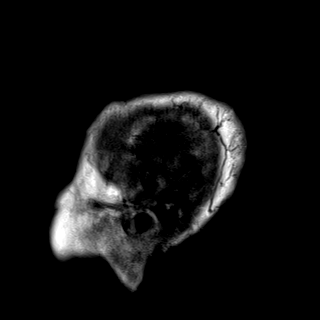
[im 34/34]
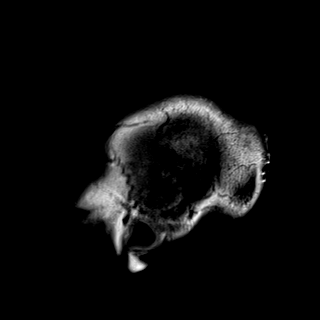

[Series 6: T2 · axial · 5.0mm · 0.75mm/px · 1 of 25 slices shown (1 of 2)]
[im 1/25]
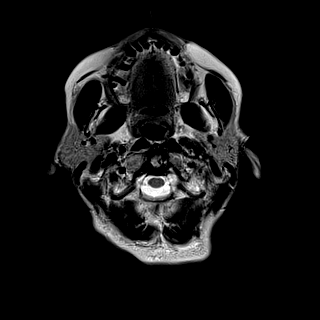

[Series 7: FLAIR · axial · 5.0mm · 0.47mm/px · z∈[-126,+44]mm · 2 of 30 slices shown (2 of 3)]
[im 1/30]
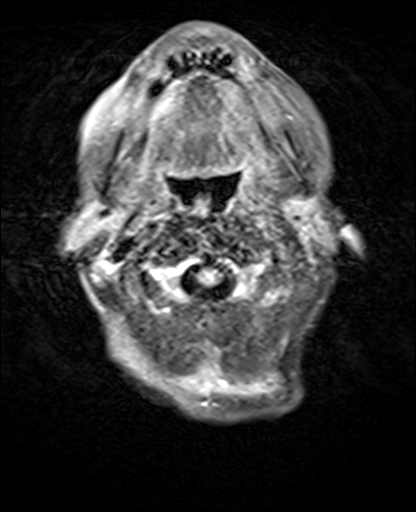
[im 30/30]
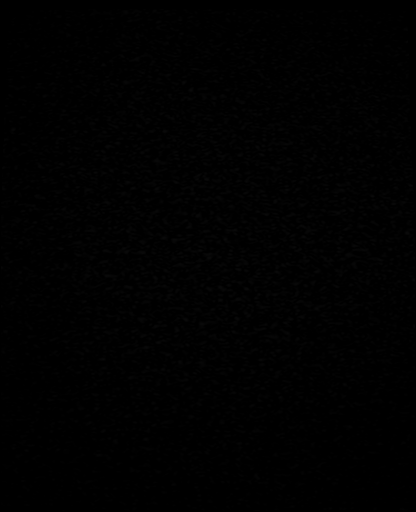

[Series 8: DWI · axial · 4.0mm · 0.88mm/px · z∈[-88,+52]mm · 4 of 72 slices shown (1 of 2)]
[im 1/72]
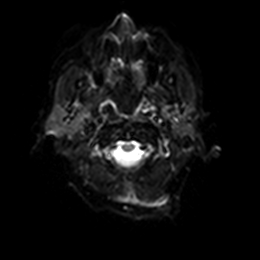
[im 24/72]
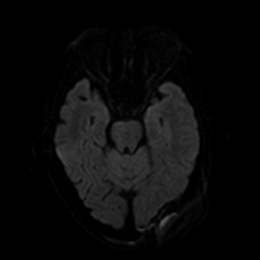
[im 48/72]
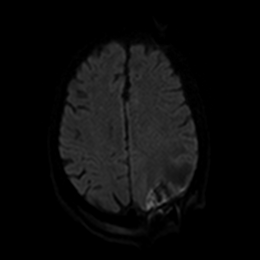
[im 72/72]
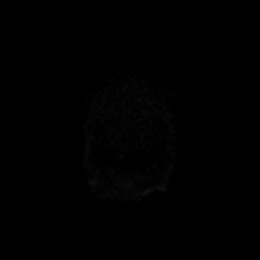

[Series 9: DWI · axial · 4.0mm · 0.88mm/px · z∈[-88,+52]mm · 2 of 34 slices shown (2 of 2)]
[im 1/34]
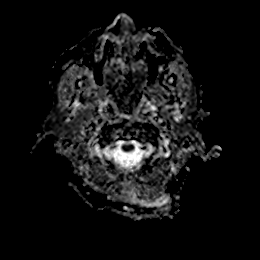
[im 34/34]
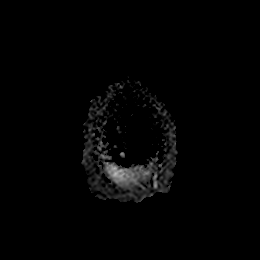

[Series 10: mag_images · axial · 3.0mm · 0.94mm/px · z∈[-101,+76]mm · 3 of 60 slices shown]
[im 1/60]
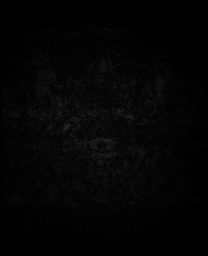
[im 30/60]
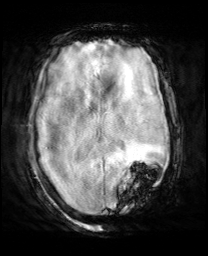
[im 60/60]
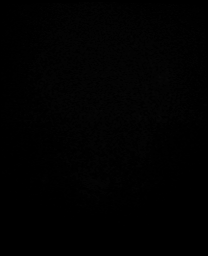

[Series 11: pha_images · axial · 3.0mm · 0.94mm/px · z∈[-95,+76]mm · 3 of 54 slices shown]
[im 1/54]
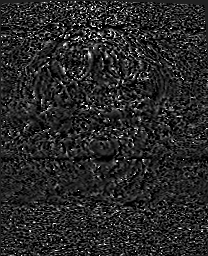
[im 27/54]
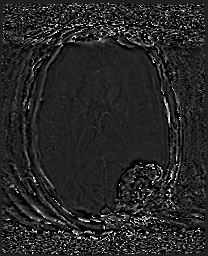
[im 54/54]
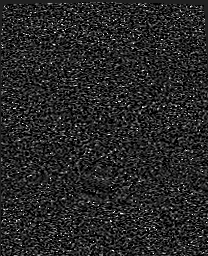

[Series 12: swi_images · axial · 3.0mm · 0.94mm/px · z∈[-101,+76]mm · 3 of 60 slices shown]
[im 1/60]
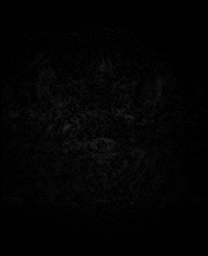
[im 30/60]
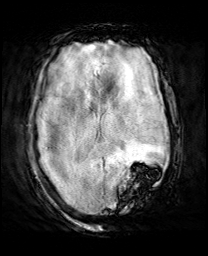
[im 60/60]
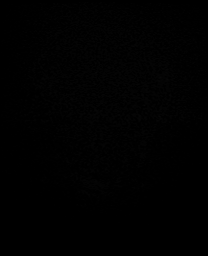

[Series 13: mip_images(sw) · axial · 24.0mm · 0.94mm/px · z∈[-91,+65]mm · 3 of 53 slices shown]
[im 1/53]
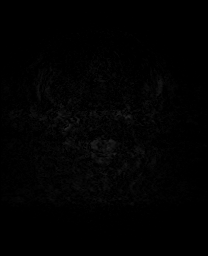
[im 27/53]
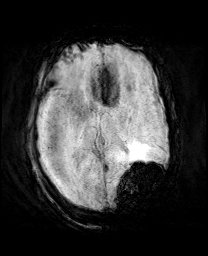
[im 53/53]
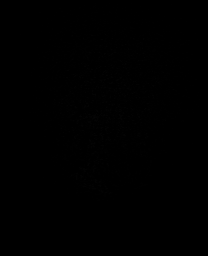

[Series 14: t1_mprage_tra_p2_iso no angle · axial · 1.0mm · 0.98mm/px · z∈[-121,+49]mm · 9 of 176 slices shown]
[im 1/176]
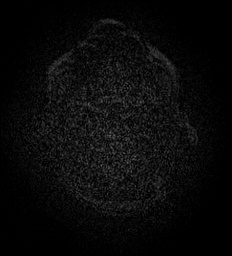
[im 22/176]
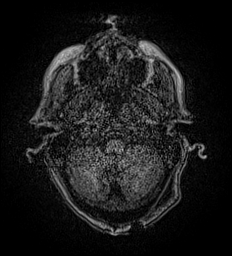
[im 44/176]
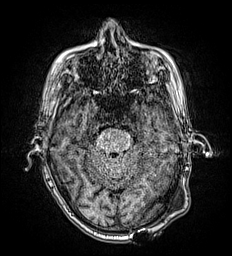
[im 66/176]
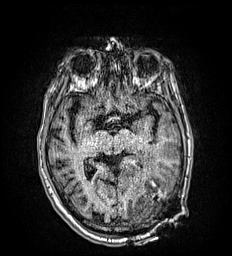
[im 88/176]
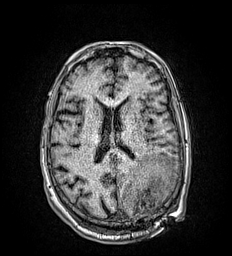
[im 110/176]
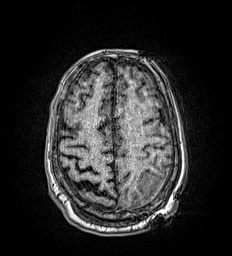
[im 132/176]
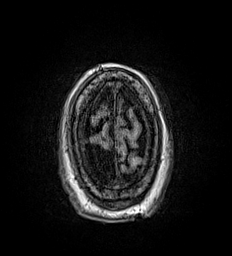
[im 154/176]
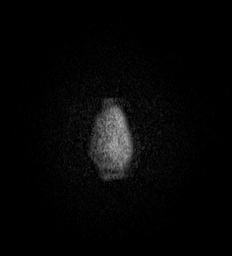
[im 176/176]
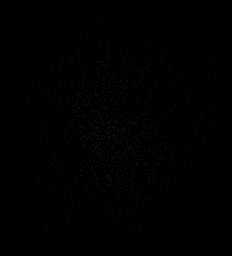

[Series 15: T2 · coronal · 5.0mm · 0.72mm/px · 2 of 28 slices shown (2 of 2)]
[im 1/28]
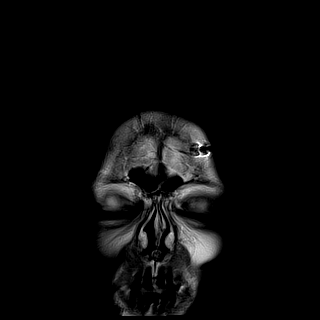
[im 28/28]
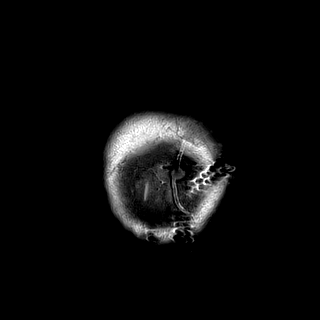

[Series 16: t1_mprage_tra_p2_iso · axial · 1.0mm · 0.98mm/px · z∈[-121,+49]mm · 9 of 176 slices shown]
[im 1/176]
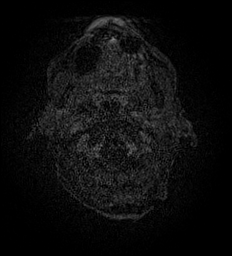
[im 22/176]
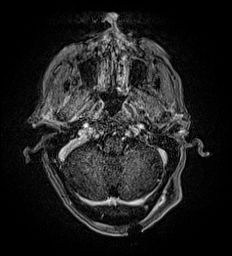
[im 44/176]
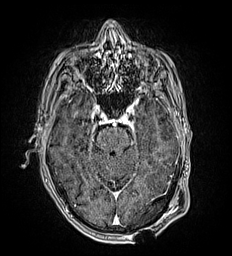
[im 66/176]
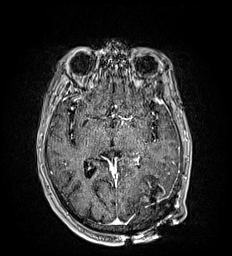
[im 88/176]
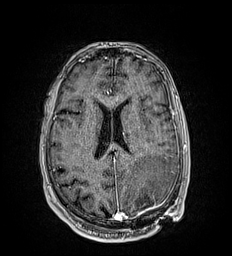
[im 110/176]
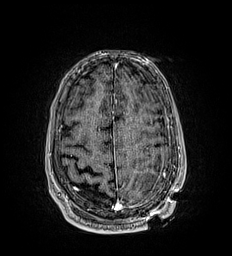
[im 132/176]
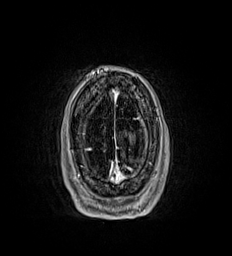
[im 154/176]
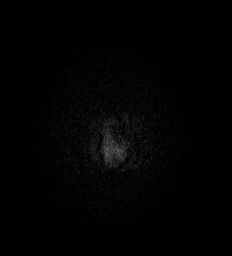
[im 176/176]
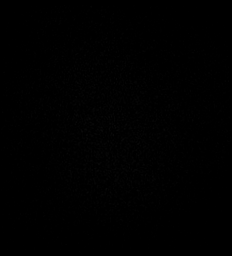

[Series 17: T1 post-contrast · coronal · 5.0mm · 0.34mm/px · 2 of 28 slices shown]
[im 1/28]
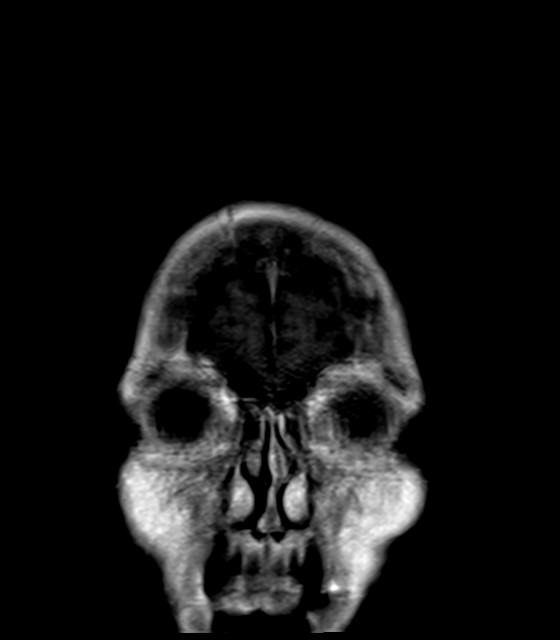
[im 28/28]
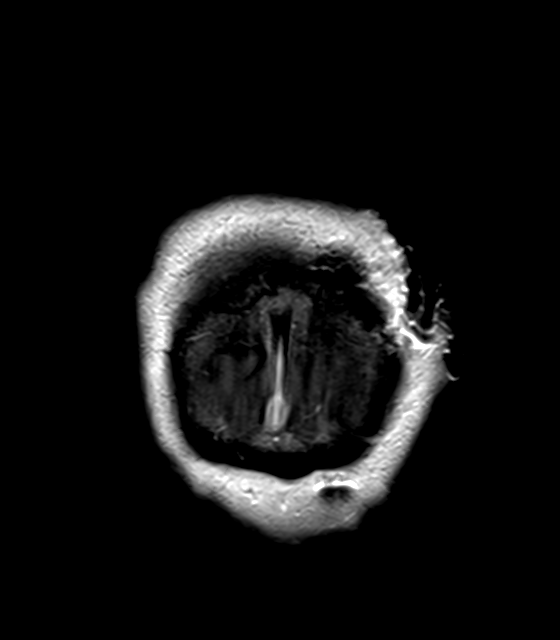

[Series 18: FLAIR · sagittal · 3.0mm · 0.72mm/px · 2 of 36 slices shown (3 of 3)]
[im 1/36]
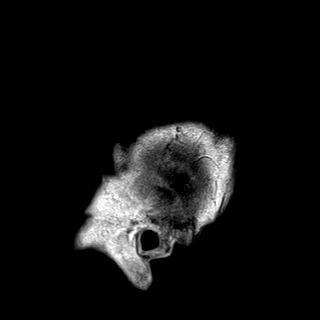
[im 36/36]
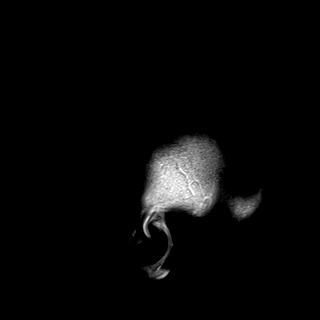

[Series 19: FLAIR post-contrast · axial · 5.0mm · 0.90mm/px · 1 of 25 slices shown]
[im 1/25]
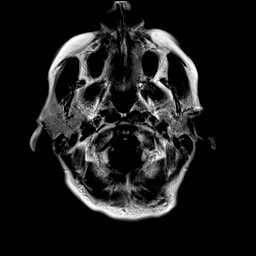

[48 of 48 positions shown; findings below may reference images not displayed]

FINDINGS: Brain: Study is moderately degraded by patient motion.

The lesion in the left parietal lobe along the central sulcus is
stable to slightly larger, now measuring 8.5 mm on coronal images.
The right temporal lesion, left insular region, and left cerebellar
lesion are all smaller.

The posterior right parietal lesion was resected. There is residual
enhancement anterior and inferior along the surgical cavity. The
area of residual enhancement anterior and inferiorly measures 2.5 cm
in longest dimension. This may represent residual tumor at the
margin of the cavity. There are associated blood products within the
cavity. T2 hyperintensity surrounding the cavity is more prominent
than on the preoperative imaging. This likely reflects some change
related to the radiation.

An extradural collection measures 4.8 x 1.3 cm maximally.

There is some mass effect on the left lateral ventricle. 5 mm
midline shift is present. Local sulcal effacement is present as
well.

The internal auditory canals are within normal limits. Mild white
matter changes are again noted in the brainstem. Brainstem and
cerebellum are otherwise normal.

Vascular: Flow is present in the major intracranial arteries.

Skull and upper cervical spine: The craniocervical junction is
normal. Upper cervical spine is within normal limits. Marrow signal
is unremarkable.

Sinuses/Orbits: Bilateral mastoid effusions are present, left
greater than right. No obstructing nasopharyngeal lesion present.
Bilateral lens replacements are present.
IMPRESSION: 1. Interval resection of dominant cystic mass in the left parietal
lobe.
2. Residual enhancement along the anterior and inferior margin the
surgical cavity measuring 2.5 cm maximally. This may represent
residual tumor. This study will serve as a baseline.
3. Blood products evident within the surgical cavity.
4. Surrounding at T2 changes have progressed. This may be related to
interval radiation therapy.
5. Mass effect related to the T2 signal changes with partial
effacement of the left lateral ventricle, 5 mm midline shift, and
local sulcal effacement.
6. Small extradural collection as described.
7. Bilateral mastoid effusions without nasopharyngeal obstruction.

## 2021-01-19 NOTE — Progress Notes (Signed)
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 0/14 lymph nodes.  No perineural invasion or LVI.  negative margins 2) 03/03/2019: presented to ED with expressive aphasia, found to have multiple brain metastasis and underwent SRS on 03/17/2019. 3) 03/18/2019: Stereotactic left parietal craniotomy for resection of tumor 4) 04/30/2020: Brain MRI on showed 2 new subcentimeter meta stasis in the right cerebellum and right frontal lobe. 5)  05/20/2020: Stereotactic radiation therapy was completed 6) 06/23/2020: establish care with Dr. [name]  7) 08/29/2020: prescribed osimirtinib therapy due to next metastatic disease in the brain.  8) 09/05/2020: started osimirtinib 80mg  PO daily 9) 10/19/2020: CT Chest showed no evidence of residual disease 10) 11/21/2020: desquamation and erythema of finger tips. Concern for HFS. Temporarily holding therapy  11) 12/05/2020: restarted Tagrisso after resolution of the HFS symptoms  Interval History:  [name] 80 y.o. female with medical history significant for metastatic adenocarcinoma of the lung with brain metastasis who presents for a follow up visit. The patient's last visit was on 12/22/2020.  In the interim since the last visit the patient has continued on her [place] with no further HFS symptoms.   On exam today [name] reports she been quite well in the interim since her last visit.  Her  hands look remarkably healthy and have very minimal redness.  She notes that she has been using a cream called "healthy host".  After the Eucerin did not help.  She reports that it coats her hands and is not particularly greasy.  She is quite happy with the results and is not having any issues with pain.  She denies having any trouble with appetite and her weight has been stable.  She does occasionally have some loose stools but she reports that this is her "usual".  Otherwise she has tolerated therapy quite well.  She is not having any other major symptoms as a result of her therapy.  She currently denies any fevers, chills, sweats, nausea vomiting or diarrhea.  A full ten point ROS is listed below.  MEDICAL HISTORY:  Past Medical History:  Diagnosis Date  . Adenocarcinoma of lung ([place]) 12/2010   left lung/surg only  . Allergic rhinitis   . 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 Anxiety disorder   . Aortic aneurysm ([place])   . Brain metastasis ([place])    lung cancer s/p resection 7/20  . Cancer ([place])    Phreesia 05/26/2020  . Complication of anesthesia 2011   bloodpressure dropped during colonoscopy, not problems since  . Compression fracture   . COPD (chronic obstructive pulmonary disease) ([place])   . Depression   . Diastolic dysfunction   . Diverticulosis   . Dysphagia   . Emphysema lung ([place]) 11/08/2014  . Headache   . Hiatal hernia   . History of kidney stones   . Hypothyroidism   . IBS (irritable bowel syndrome)   . Iron deficiency anemia due to chronic blood loss 12/05/2016  . On home O2 12/15/2013  chronic hypoxia  . Pneumonia   . PUD (peptic ulcer disease)    16yr  . Shortness of breath    with exertion  . SIADH (syndrome of inappropriate ADH production) ([place]   . Trigeminal neuralgia     SURGICAL HISTORY: Past Surgical History:  Procedure Laterality Date  . ABDOMINAL HYSTERECTOMY    . APPLICATION OF CRANIAL NAVIGATION N/A 03/18/2019    Procedure: APPLICATION OF CRANIAL NAVIGATION;  Surgeon: NConsuella Lose MD;  [facility]  Service: Neurosurgery;  Laterality: N/A;  . BACK SURGERY  January 13, 2014  . BRAIN SURGERY N/A    Phreesia 03/28/2020  . CATARACT EXTRACTION    . CATARACT EXTRACTION W/PHACO  09/02/2012   Procedure: CATARACT EXTRACTION PHACO AND INTRAOCULAR LENS PLACEMENT (IOC);  Surgeon: MElta GuadeloupeT. SGershon Crane MD;  Location: [facility];  Service: Ophthalmology;  Laterality: Left;  CDE:10.35  . CHOLECYSTECTOMY    . 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 COLONOSCOPY N/A 11/23/2014   Dr. FOneida Alar moderate diverticulosis, hemorrhoids, redundant colon. next TCS in 10-15 years.   . CRANIOTOMY Left 03/18/2019   Procedure: Left stereotactic craniotomy for tumor resection;  Surgeon: NConsuella Lose MD;  [facility]  Service: Neurosurgery;  Laterality: Left;  Left stereotactic craniotomy for tumor resection  . ENDARTERECTOMY Right 08/21/2013   Procedure: RIGHT CAROTID ANEURYSM RESECTION;  Surgeon: TRosetta Posner MD;  [facility]  Service: Vascular;  Laterality: Right;  . ESOPHAGEAL DILATION  02/24/2018   Procedure: ESOPHAGEAL DILATION;  Surgeon: FDanie Binder MD;  [facility];  Service: Endoscopy;;  . ESOPHAGOGASTRODUODENOSCOPY  11/13/2009   w/dilation to 163m gastric ulceration (H.Pylori) s/p treatment  . ESOPHAGOGASTRODUODENOSCOPY  11/21/2009   distal esophageal web, gastritis  . ESOPHAGOGASTRODUODENOSCOPY  03/14/12   SLDJT:[id number] the distal esophagus/Mild gastritis/small HH. + H.pylori, prescribed Pylera. Finished treatment.   . ESOPHAGOGASTRODUODENOSCOPY N/A 12/13/2017   Dr. FiOneida Alaresophageal web s/p dilation, gastritis, no h.pylori  . ESOPHAGOGASTRODUODENOSCOPY N/A 02/24/2018   Dr. FiOneida Alarweb in prox esophagus and in distal esophagus s/p dilation, mild gastritis, mild pyloric stenosis, mild post ulcer duodenal deformity at D1/D2  . fatty tumor removal from lt  groin    . FRACTURE SURGERY Left    hand and left ring finger  . GIVENS CAPSULE STUDY N/A 12/26/2017   incomplete  . GIVENS CAPSULE STUDY N/A 02/24/2018   normal small bowel  . KNEE SURGERY     patella tendon repair june 2019 [facility]  . LUNG CANCER SURGERY  12/2010   Left VATS, minithoracotomy, LLL superior segmentectomy  . ORIF PATELLA Right 03/18/2018   Procedure: OPEN REDUCTION INTERNAL (ORIF) FIXATION RIGHT PATELLA;  Surgeon: HaCarole CivilMD;  Location: [facility];  Service: Orthopedics;  Laterality: Right;  . ORIF WRIST FRACTURE Left 04/09/2014   Procedure: OPEN REDUCTION INTERNAL FIXATION (ORIF) WRIST FRACTURE;  Surgeon: TiRenette ButtersMD;  [facility] Service: Orthopedics;  Laterality: Left;  . PERCUTANEOUS PINNING Left 04/09/2014   Procedure: PERCUTANEOUS PINNING EXTREMITY;  Surgeon: TiRenette ButtersMD;  [facility] Service: Orthopedics;  Laterality: Left;  . SAVORY DILATION N/A 12/13/2017   Procedure: SAVORY DILATION;  Surgeon: FiDanie BinderMD;  [facility];  Service: Endoscopy;  Laterality: N/A;  . TUBAL LIGATION    . vocal cord surgery  02/06/2011   laryngoscopy with bilateral vocal cord Radiesse injection for vocal cord paralysis  . YAG LASER APPLICATION Left  09/20/2015   Procedure: YAG LASER APPLICATION;  Surgeon: Rutherford Guys, MD;  Location: [facility];  Service: Ophthalmology;  Laterality: Left;    SOCIAL HISTORY: Social History   Socioeconomic History  . Marital status: Divorced    Spouse name: Not on file  . Number of children: Not on file  . Years of education: Not on file  . Highest education level: Not on file  Occupational History  . Not on file  Tobacco Use  . Smoking status: Former Smoker    Packs/day: 0.50    Years: 20.00    Pack years: 10.00    Types: Cigarettes    Quit date: 12/21/2010    Years since quitting: 10.1  . Smokeless tobacco: Never Used  . Tobacco comment: smoking cessation info given and reviewed   Vaping Use   . Vaping Use: Never used  Substance and Sexual Activity  . Alcohol use: Not Currently    Alcohol/week: 0.0 standard drinks  . Drug use: No  . Sexual activity: Yes    Birth control/protection: Surgical  Other Topics Concern  . Not on file  Social History Narrative   Divorced since 15.Lives alone with a dog.Marland KitchenRetired.  She receives Meals on Wheels.   Social Determinants of Health   Financial Resource Strain: Low Risk   . Difficulty of Paying Living Expenses: Not hard at all  Food Insecurity: Not on file  Transportation Needs: Not on file  Physical Activity: Not on file  Stress: Not on file  Social Connections: Not on file  Intimate Partner Violence: Not on file    FAMILY HISTORY: Family History  Problem Relation Age of Onset  . Pulmonary embolism Mother   . Heart attack Father   . Hypertension Father   . Liver cancer Sister   . Cancer Sister   . Cancer Brother   . Cancer Daughter   . Breast cancer Daughter   . Colon cancer Neg Hx     ALLERGIES:  is allergic to ciprofloxacin hcl, sulfonamide derivatives, ciprofloxacin, iohexol, iodinated diagnostic agents, and prozac [fluoxetine hcl].  MEDICATIONS:  Current Outpatient Medications  Medication Sig Dispense Refill  . albuterol (VENTOLIN HFA) 108 (90 Base) MCG/ACT inhaler INHALE 2 PUFFS EVERY 6 HOURS AS NEEDED FOR WHEEZING OR SHORTNESS OF BREATH. 8.5 g 0  . ALPRAZolam (XANAX) 0.5 MG tablet TAKE (1) TABLET BY MOUTH TWICE A DAY AS NEEDED. 60 tablet 0  . cephALEXin (KEFLEX) 500 MG capsule Take 1 capsule (500 mg total) by mouth 3 (three) times daily. 21 capsule 0  . citalopram (CELEXA) 40 MG tablet TAKE (1/2) TABLET BY MOUTH AT BEDTIME. 45 tablet 0  . diphenhydrAMINE (BENADRYL) 50 MG tablet Take 1 tablet (50 mg total) by mouth once for 1 dose. Take one hour prior to CT scan 1 tablet 0  . ferrous sulfate 325 (65 FE) MG tablet Take 325 mg by mouth daily with breakfast.    . fluticasone (FLONASE) 50 MCG/ACT nasal spray Place 2  sprays into both nostrils daily. 16 g 0  . fluticasone (FLONASE) 50 MCG/ACT nasal spray Place 2 sprays into both nostrils daily. 16 g 6  . Fluticasone-Umeclidin-Vilant (TRELEGY ELLIPTA) 100-62.5-25 MCG/INH AEPB INHALE 1 PUFF INTO LUNGS DAILY. 60 each 6  . gabapentin (NEURONTIN) 100 MG capsule TAKE 1 CAPSULE BY MOUTH THREE TIMES A DAY. 90 capsule 6  . levocetirizine (XYZAL) 5 MG tablet TAKE ONE TABLET BY MOUTH ONCE DAILY. 30 tablet 6  . levothyroxine (SYNTHROID) 50 MCG tablet TAKE 1  TABLET BEFORE BREAKFAST. 90 tablet 0  . Multiple Vitamin (MULTIVITAMIN WITH MINERALS) TABS tablet Take 1 tablet by mouth daily.    . ondansetron (ZOFRAN) 4 MG tablet TAKE 1 TABLET BY MOUTH 3 TIMES DAILY AS NEEDED FOR NAUSEA AND VOMITING. 90 tablet 0  . osimertinib mesylate (TAGRISSO) 80 MG tablet TAKE 1 TABLET (80 MG TOTAL) BY MOUTH DAILY. 30 tablet 2  . OVER THE COUNTER MEDICATION Bone Support 2-4 tabs daily    . OXcarbazepine (TRILEPTAL) 150 MG tablet TAKE (1) TABLET BY MOUTH TWICE DAILY. 60 tablet 6  . pantoprazole (PROTONIX) 40 MG tablet TAKE (1) TABLET BY MOUTH ONCE DAILY BEFORE BREAKFAST. 90 tablet 1  . predniSONE (DELTASONE) 50 MG tablet Please take one tablet at 13 hours, 7 hours and one hour prior to CT scan 3 tablet 0  . Probiotic Product (ALIGN PO) Take by mouth daily.    Marland Kitchen PROLIA 60 MG/ML SOSY injection INJECT INTO UPPER ARM, THIGH, OR ABDOMEN ONCE. 1 mL 0   No current facility-administered medications for this visit.    REVIEW OF SYSTEMS:   Constitutional: ( - ) fevers, ( - )  chills , ( - ) night sweats Eyes: ( - ) blurriness of vision, ( - ) double vision, ( - ) watery eyes Ears, nose, mouth, throat, and face: ( - ) mucositis, ( - ) sore throat Respiratory: ( - ) cough, ( - ) dyspnea, ( - ) wheezes Cardiovascular: ( - ) palpitation, ( - ) chest discomfort, ( - ) lower extremity swelling Gastrointestinal:  ( - ) nausea, ( - ) heartburn, ( - ) change in bowel habits Skin: ( - ) abnormal skin  rashes Lymphatics: ( - ) new lymphadenopathy, ( - ) easy bruising Neurological: ( - ) numbness, ( - ) tingling, ( - ) new weaknesses Behavioral/Psych: ( - ) mood change, ( - ) new changes  All other systems were reviewed with the patient and are negative.  PHYSICAL EXAMINATION: Telephone Visit  LABORATORY DATA:  I have reviewed the data as listed CBC Latest Ref Rng & Units 01/19/2021 12/22/2020 11/21/2020  WBC 4.0 - 10.5 K/uL 4.8 5.2 4.6  Hemoglobin 12.0 - 15.0 g/dL 11.0(L) 11.6(L) 10.8(L)  Hematocrit 36.0 - 46.0 % 33.7(L) 35.9(L) 33.2(L)  Platelets 150 - 400 K/uL 207 212 209    CMP Latest Ref Rng & Units 01/19/2021 12/22/2020 11/21/2020  Glucose 70 - 99 mg/dL 87 86 82  BUN 8 - 23 mg/dL _0 Creatinine 0.44 - 1.00 mg/dL 0.63 0.64 0.65  Sodium 135 - 145 mmol/L 133(L) 132(L) 134(L)  Potassium 3.5 - 5.1 mmol/L 4.8 4.9 4.7  Chloride 98 - 111 mmol/L 96(L) 98 101  CO2 22 - 32 mmol/L _1 Calcium 8.9 - 10.3 mg/dL 9.2 9.4 9.5  Total Protein 6.5 - 8.1 g/dL 6.6 6.8 6.4(L)  Total Bilirubin 0.3 - 1.2 mg/dL 0.3 0.5 0.4  Alkaline Phos 38 - 126 U/L 46 55 43  AST 15 - 41 U/L _2 ALT 0 - 44 U/L _3 RADIOGRAPHIC STUDIES:  MRI Brain 08/05/2020:   CLINICAL DATA:  Trigeminal neuralgia. Headache, cluster/trigeminal. Brain/CNS neoplasm, assess treatment response. Metastatic lung cancer. Additional history provided: SRS to 5 brain metastases 03/17/2019, left parietal craniotomy for tumor resection 03/18/2019, SRS to 2 additional metastases 01/01/2020.  EXAM: MRI HEAD WITHOUT AND WITH CONTRAST  MRA HEAD WITHOUT CONTRAST  TECHNIQUE: Multiplanar, multiecho pulse sequences of  the brain and surrounding structures were obtained without and with intravenous contrast. Angiographic images of the head were obtained using MRA technique without contrast.  CONTRAST:  67mL MULTIHANCE GADOBENATE DIMEGLUMINE 529 MG/ML IV SOLN  COMPARISON:  Prior brain MRI examinations  04/30/2020 and earlier. MRA head 03/04/2019.  FINDINGS: MRI HEAD FINDINGS  Brain:  Stable generalized cerebral atrophy.  Again demonstrated is a hemosiderin stained left parietooccipital resection cavity. Irregular and gyriform enhancement has increased from the prior examination, now measuring up to 12 mm in thickness (for instance as seen on series 14, image 90) (series 15, image 9). This irregular enhancement extends to the site of a previously demonstrated 3 mm nodular focus of enhancement within the superolateral aspect of the cavity. Surrounding T2/FLAIR hyperintense signal abnormality has not significantly changed.  A previously demonstrated 4 mm lesion within the medial right cerebellum is no longer appreciated.  A 4 mm enhancing lesion within the left cerebellar hemisphere was present on the prior examination but seen to better advantage on today's study (series 14, image 48).  New 2 mm enhancing lesion more posteriorly within the medial left cerebellar hemisphere (series 14, image 50).  New 2 mm enhancing lesion within the lateral left cerebellum (series 14, image 42).  A 3 mm lesion within the high posterior right frontal lobe has decreased in size (series 14, image 145) (previously 5 mm).  A previously demonstrated 2 mm enhancing lesion within the left frontal lobe has increased in size, now measuring 8 mm (series 14, image 125) (series 11, image 39) . The lesion now demonstrates central T2/FLAIR hyperintensity and T1 hyperintensity with a peripheral low signal rim. Precontrast T1 hyperintensity limits evaluation for enhancement at this site.  Multifocal T2/FLAIR hyperintensity elsewhere within the cerebral white matter and within the pons is nonspecific, but compatible chronic small vessel ischemic disease.  No evidence of acute infarction.  No extra-axial fluid collection.  No midline shift.  Vascular: Expected proximal arterial flow  voids.  Skull and upper cervical spine: No focal suspicious marrow lesion.  Sinuses/Orbits: Visualized orbits show no acute finding. Trace ethmoid sinus mucosal thickening. No significant mastoid effusion.  MRA HEAD FINDINGS  The intracranial internal carotid arteries are patent. The M1 middle cerebral arteries are patent without significant stenosis. No M2 proximal branch occlusion or high-grade proximal stenosis is identified. The anterior cerebral arteries are patent. 1-2 mm inferiorly projecting vascular protrusion arising from the supraclinoid right ICA which may reflect an infundibulum or small aneurysm (series 103, image 12).  The intracranial vertebral arteries are patent. The basilar artery is patent. The posterior cerebral arteries are patent.  IMPRESSION: MRI brain:  1. Irregular and gyriform enhancement at site of the left parietooccipital resection cavity has increased, now measuring up to 12 mm in thickness. This enhancement extends to the site of a previously demonstrated 3 mm nodular enhancing focus within the superolateral aspect of the cavity. Findings may reflect post-treatment inflammation or recurrent tumor. Short interval MRI follow-up is recommended. Surrounding T2 hyperintense signal abnormality has not appreciably changed. 2. New 2 mm metastasis within the posteromedial left cerebellar hemisphere. 3. New 2 mm metastasis within the lateral left cerebellum. 4. An additional 4 mm metastasis within the left cerebellum was present on the prior MRI of 04/30/2020, but is seen to better advantage on today's study. 5. A previously demonstrated 4 mm metastasis within the medial right cerebellum is no longer appreciated 6. 8 mm metastasis within the paramedian left frontal lobe, increased in size and now  demonstrating precontrast T1 hyperintensity which limits evaluation for enhancement. 7. A 3 mm lesion within the high posterior right frontal lobe  has decreased in size.  MRA head:  1. No intracranial large vessel occlusion or proximal high-grade arterial stenosis. 2. 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   Electronically Signed   By: Kellie Simmering DO   On: 08/05/2020 13:49  No results found.  ASSESSMENT & PLAN [name] 80 y.o. female with medical history significant for metastatic adenocarcinoma of the lung with brain metastasis who presents for a follow up visit.  After review the records, review of the labs, review the imaging, discussion with the patient the findings are most consistent with a stage IV adenocarcinoma of the lung with a small lung lesion and metastatic spread to the brain.  Her metastatic brain mets have been treated with resection of tumor on 03/18/2019 as well as SRS on 12/12/2019.  Most recently she was found to have 2 new subcentimeter lesions and underwent stereotactic radiation therapy which was completed on 05/20/2020. Unfortunately on 08/05/2020 she was noted to have new CNS metastatic disease.   There are limitations in what therapies can be used for this patient given her poor functional status.  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  After extensive discussions with the patient's about the risks and benefits of this treatment she was agreeable to starting osimertinib 80 mg p.o. daily for her metastatic adenocarcinoma of the lung. She started this treatment on 09/05/2020.  On discussion today [name] appears to have recovered well from the HFS.  She was off the [place] therapy for 2 weeks and has been back on for 2 weeks with only some mild dryness of her hands.. Recommend continued Eucerin cream. We can consider restarting once this resolves, I would assume this would be 1-2 weeks time.  Overall she is tolerating Tagrisso well and is willing and able to proceed at this time.  Restaging MRI is scheduled  for later April 2022.  #Stage IV ([id number]) adenocarcinoma of the lung: -Left lower lobe resection on 12/21/2010, 14 lymph nodes negative, 2.2 cm tumor size, margins negative. -most recent MRI brain scan shows stable disease. No evidence of Chest/abdomen involvement on scan from 10/19/2020.  --started therapy with osimertinib 80 mg PO daily for her EGFR Exon 19 mutation. She started on 09/05/2020 -CT chest with contrast on 10/20/2019 showed no active disease in the chest. Continue CT scans q 3 months.  -PD-L1 testing was 1%. -- Patient is a poor candidate for systemic therapy, though she does have an EGFR Exon 19 mutation.  --RTC in 4 weeks time to assure continued tolerance of osimirtenib.   #Hand Foot Syndrome, improved --previously noted to have desquamation and erythema of finger tips. Resolved on exam today.  --held therapy for 1-2 weeks until resolution --patient had resolution of symptoms after 2 weeks off therapy. Encouraged continue aggressive moisturizing lotion.   #Brain metastasis, stable -Presentation with expressive aphasia to ER on 03/03/2019 along with right-sided weakness. Found to have multiple brain metastasis and underwent SRS on 03/17/2019. 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 -Underwent SRS on 01/01/2020 for 2 subcentimeter brain lesions. -Brain MRI on 04/30/2020 showed 2 new subcentimeter meta stasis in the right cerebellum and right frontal lobe. -Stereotactic radiation therapy was completed on 05/20/2020 --most recent MRI on  08/05/2020 showed progression on disease. Starting systemic therapy as above.  --Next MRI to be performed on 02/03/2021.   Orders Placed This Encounter  Procedures  . CT CHEST ABDOMEN PELVIS W CONTRAST    Standing Status:   Future    Standing Expiration Date:   01/19/2022    Order Specific Question:   If indicated for the ordered procedure, I authorize the  administration of contrast media per Radiology protocol    Answer:   Yes    Order Specific Question:   Preferred imaging location?    Answer:   [facility]    Order Specific Question:   Is Oral Contrast requested for this exam?    Answer:   Yes, Per Radiology protocol    Order Specific Question:   Reason for Exam (SYMPTOM  OR DIAGNOSIS REQUIRED)    Answer:   Lung cancer, assess for response    All questions were answered. The patient knows to call the clinic with any problems, questions or concerns.  A total of more than 30 minutes were spent on this encounter and over half of that time was spent on counseling and coordination of care as outlined above.   Ledell Peoples, MD Department of Hematology/Oncology [place] at [facility] Phone: [phone] Pager: [phone] Email: [email or web address]  01/24/2021 11:45 AM    Suszanne Conners HA, Su JM, Tamms SH, Rowe Pavy, [place], [place]. 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 J Thorac Oncol. 2020 Nov;15(11):1758-1766. doi: 10.1016/[email or web address].2020.06.018. Epub 2020 Jul 9. PMID: [id number].  --Osimertinib is a promising treatment option for EGFR-mutated NSCLC with LM regardless of T790M mutational status.

## 2021-01-20 ENCOUNTER — Telehealth: Payer: Self-pay | Admitting: Hematology and Oncology

## 2021-01-20 NOTE — Telephone Encounter (Signed)
Scheduled per 4/14 LOS, Appointment scheduled for patient, Patient contacted & says time doesn't work. Appointment cancellled .

## 2021-01-25 ENCOUNTER — Encounter: Payer: Self-pay | Admitting: Hematology and Oncology

## 2021-01-26 ENCOUNTER — Encounter: Payer: Self-pay | Admitting: Hematology and Oncology

## 2021-01-26 ENCOUNTER — Encounter: Payer: Self-pay | Admitting: Internal Medicine

## 2021-01-27 ENCOUNTER — Telehealth: Payer: Self-pay | Admitting: Hematology and Oncology

## 2021-01-27 ENCOUNTER — Other Ambulatory Visit: Payer: Self-pay | Admitting: *Deleted

## 2021-01-27 MED ORDER — PREDNISONE 50 MG PO TABS: ORAL_TABLET | ORAL | 0 refills | Status: DC

## 2021-01-27 NOTE — Telephone Encounter (Signed)
Scheduled appt per 4/22 sch msg. Pt aware.

## 2021-01-27 NOTE — Telephone Encounter (Signed)
TCT patient regarding up coming appts.  Reviewed her appts with her and she voiced understanding  With appts with Dr. Mickeal Skinner, Dr. [name] and for her scans.  Refilled her prednisone for CT scans as she has IV dye allergy. Advised to pick up oral contrast either here or at [facility] prior to CT scans. She voiced understanding

## 2021-01-28 ENCOUNTER — Other Ambulatory Visit: Payer: Self-pay | Admitting: Family Medicine

## 2021-01-30 DIAGNOSIS — J449 Chronic obstructive pulmonary disease, unspecified: Secondary | ICD-10-CM | POA: Diagnosis not present

## 2021-01-30 DIAGNOSIS — R0902 Hypoxemia: Secondary | ICD-10-CM | POA: Diagnosis not present

## 2021-01-30 DIAGNOSIS — J9621 Acute and chronic respiratory failure with hypoxia: Secondary | ICD-10-CM | POA: Diagnosis not present

## 2021-01-30 DIAGNOSIS — C3490 Malignant neoplasm of unspecified part of unspecified bronchus or lung: Secondary | ICD-10-CM | POA: Diagnosis not present

## 2021-01-31 ENCOUNTER — Other Ambulatory Visit (HOSPITAL_COMMUNITY): Payer: Self-pay

## 2021-01-31 NOTE — Telephone Encounter (Signed)
Ok to refill??  Last office visit 07/04/2020.  Last refill 10/25/2020.

## 2021-02-03 ENCOUNTER — Other Ambulatory Visit: Payer: Self-pay

## 2021-02-03 ENCOUNTER — Ambulatory Visit
Admission: RE | Admit: 2021-02-03 | Discharge: 2021-02-03 | Disposition: A | Discharge status: Home / Self Care | Payer: PPO | Source: Ambulatory Visit | Attending: Internal Medicine | Admitting: Internal Medicine

## 2021-02-03 DIAGNOSIS — C7931 Secondary malignant neoplasm of brain: Secondary | ICD-10-CM

## 2021-02-03 DIAGNOSIS — C7800 Secondary malignant neoplasm of unspecified lung: Secondary | ICD-10-CM | POA: Diagnosis not present

## 2021-02-03 MED ORDER — GADOBENATE DIMEGLUMINE 529 MG/ML IV SOLN
11.0000 mL | Freq: Once | INTRAVENOUS | Status: AC | PRN
  Administered 2021-02-03: 11 mL via INTRAVENOUS

## 2021-02-06 ENCOUNTER — Inpatient Hospital Stay: Payer: PPO | Attending: Internal Medicine

## 2021-02-06 DIAGNOSIS — C3432 Malignant neoplasm of lower lobe, left bronchus or lung: Secondary | ICD-10-CM | POA: Insufficient documentation

## 2021-02-06 DIAGNOSIS — C7931 Secondary malignant neoplasm of brain: Secondary | ICD-10-CM | POA: Insufficient documentation

## 2021-02-07 ENCOUNTER — Other Ambulatory Visit: Payer: Self-pay

## 2021-02-07 ENCOUNTER — Inpatient Hospital Stay (HOSPITAL_BASED_OUTPATIENT_CLINIC_OR_DEPARTMENT_OTHER): Payer: PPO | Admitting: Internal Medicine

## 2021-02-07 VITALS — BP 103/79 | HR 93 | Temp 98.7°F | Resp 18 | Ht 60.0 in | Wt 112.0 lb

## 2021-02-07 DIAGNOSIS — C7931 Secondary malignant neoplasm of brain: Secondary | ICD-10-CM | POA: Diagnosis not present

## 2021-02-07 DIAGNOSIS — C3432 Malignant neoplasm of lower lobe, left bronchus or lung: Secondary | ICD-10-CM | POA: Diagnosis not present

## 2021-02-07 NOTE — Progress Notes (Signed)
[facility] [place], [place] [address] [phone]  Interval Evaluation  Date of Service: 02/07/21 Patient Name: [name] Patient MRN: [id number] Patient DOB: Feb 27, 1941 Provider: Ventura Sellers, MD  Identifying Statement:  [name] is a 80 y.o. 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 0/14 lymph nodes.  No perineural invasion or LVI.  negative margins.   06/05/2016 Imaging   Although the previously described ground-glass attenuation nodule in the superior segment of the right lower lobe measures slightly larger on today's examination than prior studies, this is favored to be technique related. Today's study should serve as a baseline for future followup examinations. At this time, there is no central solid component to this lesion. No new nodules are noted. 2. Stable postoperative scarring in the left lower lobe and post infectious scarring throughout the visualize lung bases, as above. 3. Areas of cylindrical bronchiectasis throughout the lung bases bilaterally    05/09/2017 Imaging   IMPRESSION: 1. Status post left lower lobe wedge resection. No findings to suggest local recurrence of disease or definite metastatic disease in the thorax. Previously noted ground-glass attenuation nodule in the superior segment of the right lower lobe appears slightly smaller than the prior study, suggesting a benign etiology. 2. Diffuse bronchial wall thickening with moderate to severe centrilobular and paraseptal emphysema; imaging findings suggestive of underlying COPD. 3. Aortic atherosclerosis, in addition to left main and left circumflex coronary artery disease. Assessment for potential risk factor  modification, dietary therapy or pharmacologic therapy may be warranted, if clinically indicated. 4. Increasing anterior pericardial fluid and/or thickening. This is unlikely to be of hemodynamic significance at this time. No associated pericardial calcification. 5. New (but non acute) compression fracture of T6 with approximately 90% loss of anterior vertebral body height, in addition to multiple other chronic compression fractures of the visualize the thoracolumbar spine.   03/22/2020 Genetic Testing   PD-L1     03/29/2020 Genetic Testing   Foundation One       CNS Oncologic History 03/17/19: Left parietal pre-op SRS, plus SRS to 4 additional smaller targets 03/18/19: Left parietal craniotomy for resection of tumor with Dr. Kathyrn Sheriff  01/01/20: SRS to 2 additional sub-cm metastases 05/20/20: SRS to 2 further metastases 08/05/20: Progressive disease, started Tagrisso therapy  Interval History:  [name] presents following recent MRI brain.  She denies new or progressive neurologic deficits today. Tolerating Tagrisso well aside from some skin issues.  Right sided visual field impairment in static as prior since surgery.  Still utilizes walker for ambulation.  No seizures or headaches.    Medications: Current Outpatient Medications on File Prior to Visit  Medication Sig Dispense Refill  . albuterol (VENTOLIN HFA) 108 (90 Base) MCG/ACT inhaler INHALE 2 PUFFS EVERY 6 HOURS AS NEEDED FOR WHEEZING OR SHORTNESS OF BREATH. 8.5 g 0  . ALPRAZolam (XANAX) 0.5 MG tablet TAKE (1) TABLET BY MOUTH TWICE A DAY AS NEEDED. 60 tablet 0  . cephALEXin (KEFLEX) 500 MG capsule Take 1 capsule (500 mg total) by mouth 3 (three) times daily. 21 capsule 0  . citalopram (CELEXA) 40 MG tablet TAKE (1/2) TABLET BY MOUTH AT BEDTIME. 45 tablet 0  . ferrous sulfate 325 (65 FE) MG tablet Take 325 mg  by mouth daily with breakfast.    . fluticasone (FLONASE) 50 MCG/ACT nasal spray Place 2 sprays into both  nostrils daily. 16 g 6  . Fluticasone-Umeclidin-Vilant (TRELEGY ELLIPTA) 100-62.5-25 MCG/INH AEPB INHALE 1 PUFF INTO LUNGS DAILY. 60 each 6  . gabapentin (NEURONTIN) 100 MG capsule TAKE 1 CAPSULE BY MOUTH THREE TIMES A DAY. 90 capsule 6  . levocetirizine (XYZAL) 5 MG tablet TAKE ONE TABLET BY MOUTH ONCE DAILY. 30 tablet 6  . levothyroxine (SYNTHROID) 50 MCG tablet TAKE 1 TABLET BEFORE BREAKFAST. 90 tablet 0  . Multiple Vitamin (MULTIVITAMIN WITH MINERALS) TABS tablet Take 1 tablet by mouth daily.    . ondansetron (ZOFRAN) 4 MG tablet TAKE 1 TABLET BY MOUTH 3 TIMES DAILY AS NEEDED FOR NAUSEA AND VOMITING. 90 tablet 0  . osimertinib mesylate (TAGRISSO) 80 MG tablet TAKE 1 TABLET (80 MG TOTAL) BY MOUTH DAILY. 30 tablet 2  . OVER THE COUNTER MEDICATION Bone Support 2-4 tabs daily    . OXcarbazepine (TRILEPTAL) 150 MG tablet TAKE (1) TABLET BY MOUTH TWICE DAILY. 60 tablet 6  . pantoprazole (PROTONIX) 40 MG tablet TAKE (1) TABLET BY MOUTH ONCE DAILY BEFORE BREAKFAST. 90 tablet 1  . predniSONE (DELTASONE) 50 MG tablet Please take one tablet at 13 hours, 7 hours and one hour prior to CT scan 3 tablet 0  . Probiotic Product (ALIGN PO) Take by mouth daily.    Marland Kitchen PROLIA 60 MG/ML SOSY injection INJECT INTO UPPER ARM, THIGH, OR ABDOMEN ONCE. 1 mL 0  . diphenhydrAMINE (BENADRYL) 50 MG tablet Take 1 tablet (50 mg total) by mouth once for 1 dose. Take one hour prior to CT scan 1 tablet 0   No current facility-administered medications on file prior to visit.    Allergies:  Allergies  Allergen Reactions  . Ciprofloxacin Hcl Hives  . Sulfonamide Derivatives Hives and Rash  . Ciprofloxacin Hives  . Iohexol Other (See Comments)    UNSPECIFIED REACTION  Desc: Per [facility], pt is allergic to IV contrast, no type of reaction was available. Pt cannot remember but first reacted around 15 yrs ago from an IVP. Notes were date from 2009 at urology center., [place] Date: [id number]   . Iodinated Diagnostic Agents  Rash and Itching  . Prozac [Fluoxetine Hcl] Rash   Past Medical History:  Past Medical History:  Diagnosis Date  . 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 Allergic rhinitis   . 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 Anxiety disorder   . Aortic aneurysm ([facility])   . Brain metastasis ([facility])    lung cancer s/p resection 7/20  . Cancer ([facility])    Phreesia 05/26/2020  . Complication of anesthesia 2011   bloodpressure dropped during colonoscopy, not problems since  . Compression fracture   . COPD (chronic obstructive pulmonary disease) ([facility])   . Depression   . Diastolic dysfunction   . Diverticulosis   . Dysphagia   . Emphysema lung ([facility]) 11/08/2014  . Headache   . Hiatal hernia   . History of kidney stones   . Hypothyroidism   . IBS (irritable bowel syndrome)   . Iron deficiency anemia due to chronic blood loss 12/05/2016  . On home O2 12/15/2013   chronic hypoxia  . Pneumonia   . PUD (peptic ulcer disease)    34yr  . Shortness of breath    with exertion  . 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 Trigeminal neuralgia    Past Surgical History:  Past Surgical History:  Procedure Laterality Date  . ABDOMINAL HYSTERECTOMY    . APPLICATION OF CRANIAL NAVIGATION N/A 03/18/2019   Procedure: APPLICATION OF CRANIAL NAVIGATION;  Surgeon: Consuella Lose, MD;  Location: [facility];  Service: Neurosurgery;  Laterality: N/A;  . BACK SURGERY  January 13, 2014  . BRAIN SURGERY N/A    Phreesia 03/28/2020  . CATARACT EXTRACTION    . CATARACT EXTRACTION W/PHACO  09/02/2012   Procedure: CATARACT EXTRACTION PHACO AND INTRAOCULAR LENS PLACEMENT (IOC);  Surgeon: Elta Guadeloupe T. Gershon Crane, MD;  [facility]: [facility];  Service: Ophthalmology;  Laterality: Left;  CDE:10.35  . CHOLECYSTECTOMY    . 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 COLONOSCOPY N/A 11/23/2014   Dr. Oneida Alar: moderate diverticulosis, hemorrhoids,  redundant colon. next TCS in 10-15 years.   . CRANIOTOMY Left 03/18/2019   Procedure: Left stereotactic craniotomy for tumor resection;  Surgeon: Consuella Lose, MD;  Location: [facility];  Service: Neurosurgery;  Laterality: Left;  Left stereotactic craniotomy for tumor resection  . ENDARTERECTOMY Right 08/21/2013   Procedure: RIGHT CAROTID ANEURYSM RESECTION;  Surgeon: Rosetta Posner, MD;  Location: [facility];  Service: Vascular;  Laterality: Right;  . ESOPHAGEAL DILATION  02/24/2018   Procedure: ESOPHAGEAL DILATION;  Surgeon: Danie Binder, MD;  [facility];  Service: Endoscopy;;  . ESOPHAGOGASTRODUODENOSCOPY  11/13/2009   w/dilation to 46m, gastric ulceration (H.Pylori) s/p treatment  . ESOPHAGOGASTRODUODENOSCOPY  11/21/2009   distal esophageal web, gastritis  . ESOPHAGOGASTRODUODENOSCOPY  03/14/12   SIRC:VELFYBOFBin the distal esophagus/Mild gastritis/small HH. + H.pylori, prescribed Pylera. Finished treatment.   . ESOPHAGOGASTRODUODENOSCOPY N/A 12/13/2017   Dr. FOneida Alar esophageal web s/p dilation, gastritis, no h.pylori  . ESOPHAGOGASTRODUODENOSCOPY N/A 02/24/2018   Dr. FOneida Alar web in prox esophagus and in distal esophagus s/p dilation, mild gastritis, mild pyloric stenosis, mild post ulcer duodenal deformity at D1/D2  . fatty tumor removal from lt groin    . FRACTURE SURGERY Left    hand and left ring finger  . GIVENS CAPSULE STUDY N/A 12/26/2017   incomplete  . GIVENS CAPSULE STUDY N/A 02/24/2018   normal small bowel  . KNEE SURGERY     patella tendon repair june 2019 [facility]  . LUNG CANCER SURGERY  12/2010   Left VATS, minithoracotomy, LLL superior segmentectomy  . ORIF PATELLA Right 03/18/2018   Procedure: OPEN REDUCTION INTERNAL (ORIF) FIXATION RIGHT PATELLA;  Surgeon: HCarole Civil MD;  [facility];  Service: Orthopedics;  Laterality: Right;  . ORIF WRIST FRACTURE Left 04/09/2014   Procedure: OPEN REDUCTION INTERNAL FIXATION (ORIF) WRIST FRACTURE;  Surgeon: TRenette Butters MD;  [facility]  Service: Orthopedics;  Laterality: Left;  . PERCUTANEOUS PINNING Left 04/09/2014   Procedure: PERCUTANEOUS PINNING EXTREMITY;  Surgeon: TRenette Butters MD;  [facility] [facility]  Service: Orthopedics;  Laterality: Left;  . SAVORY DILATION N/A 12/13/2017   Procedure: SAVORY DILATION;  Surgeon: FDanie Binder MD;  [facility];  Service: Endoscopy;  Laterality: N/A;  . TUBAL LIGATION    . vocal cord surgery  02/06/2011   laryngoscopy with bilateral vocal cord Radiesse injection for vocal cord paralysis  . YAG LASER APPLICATION Left 151/11/5850  Procedure: YAG LASER APPLICATION;  Surgeon: MRutherford Guys MD;  [facility];  Service: Ophthalmology;  Laterality: Left;   Social History:  Social History   Socioeconomic  History  . Marital status: Divorced    Spouse name: Not on file  . Number of children: Not on file  . Years of education: Not on file  . Highest education level: Not on file  Occupational History  . Not on file  Tobacco Use  . Smoking status: Former Smoker    Packs/day: 0.50    Years: 20.00    Pack years: 10.00    Types: Cigarettes    Quit date: 12/21/2010    Years since quitting: 10.1  . Smokeless tobacco: Never Used  . Tobacco comment: smoking cessation info given and reviewed   Vaping Use  . Vaping Use: Never used  Substance and Sexual Activity  . Alcohol use: Not Currently    Alcohol/week: 0.0 standard drinks  . Drug use: No  . Sexual activity: Yes    Birth control/protection: Surgical  Other Topics Concern  . Not on file  Social History Narrative   Divorced since 29.Lives alone with a dog.Marland KitchenRetired.  She receives Meals on Wheels.   Social Determinants of Health   Financial Resource Strain: Low Risk   . Difficulty of Paying Living Expenses: Not hard at all  Food Insecurity: Not on file  Transportation Needs: Not on file  Physical Activity: Not on file  Stress: Not on file  Social Connections: Not on file  Intimate  Partner Violence: Not on file   Family History:  Family History  Problem Relation Age of Onset  . Pulmonary embolism Mother   . Heart attack Father   . Hypertension Father   . Liver cancer Sister   . Cancer Sister   . Cancer Brother   . Cancer Daughter   . Breast cancer Daughter   . Colon cancer Neg Hx     Review of Systems: Constitutional: Doesn't report fevers, chills or abnormal weight loss Eyes: Doesn't report blurriness of vision Ears, nose, mouth, throat, and face: Doesn't report sore throat Respiratory: Doesn't report cough, dyspnea or wheezes Cardiovascular: Doesn't report palpitation, chest discomfort  Gastrointestinal:  Doesn't report nausea, constipation, diarrhea GU: Doesn't report incontinence Skin: Doesn't report skin rashes Neurological: Per HPI Musculoskeletal: Doesn't report joint pain Behavioral/Psych: Doesn't report anxiety  Physical Exam: Vitals:   02/07/21 1111  BP: 103/79  Pulse: 93  Resp: 18  Temp: 98.7 F (37.1 C)  SpO2: (!) 87%   KPS: 80. General: Alert, cooperative, pleasant, in no acute distress Head: Normal EENT: No conjunctival injection or scleral icterus.  Lungs: Resp effort normal Cardiac: Regular rate Abdomen: Non-distended abdomen Skin: No rashes cyanosis or petechiae. Extremities: No clubbing or edema  Neurologic Exam: Mental Status: Awake, alert, attentive to examiner. Oriented to self and environment. Language is fluent with intact comprehension.  Cranial Nerves: Visual acuity is grossly normal. Right homonymous hemianopia. Extra-ocular movements intact. No ptosis. Face is symmetric Motor: Tone and bulk are normal. Power is full in both arms and legs. Reflexes are symmetric, no pathologic reflexes present.  Sensory: Intact to light touch Gait: Normal.   Labs: I have reviewed the data as listed    Component Value Date/Time   NA 133 (L) 01/19/2021 1433   K 4.8 01/19/2021 1433   CL 96 (L) 01/19/2021 1433   CO2 28  01/19/2021 1433   GLUCOSE 87 01/19/2021 1433   BUN 16 01/19/2021 1433   CREATININE 0.63 01/19/2021 1433   CREATININE 0.83 01/26/2020 1623   CALCIUM 9.2 01/19/2021 1433   PROT 6.6 01/19/2021 1433   ALBUMIN 4.0 01/19/2021 1433  AST 20 01/19/2021 1433   ALT 15 01/19/2021 1433   ALKPHOS 46 01/19/2021 1433   BILITOT 0.3 01/19/2021 1433   GFRNONAA >60 01/19/2021 1433   GFRNONAA 68 01/26/2020 1623   GFRAA >60 06/20/2020 0949   GFRAA 78 01/26/2020 1623   Lab Results  Component Value Date   WBC 4.8 01/19/2021   NEUTROABS 3.5 01/19/2021   HGB 11.0 (L) 01/19/2021   HCT 33.7 (L) 01/19/2021   MCV 90.3 01/19/2021   PLT 207 01/19/2021    Imaging:  [facility] Clinician Interpretation: I have personally reviewed the CNS images as listed.  My interpretation, in the context of the patient's clinical presentation, is stable disease  MR BRAIN W WO CONTRAST  Result Date: 02/03/2021 CLINICAL DATA:  Metastatic lung cancer.  Assess treatment response. EXAM: MRI HEAD WITHOUT AND WITH CONTRAST TECHNIQUE: Multiplanar, multiecho pulse sequences of the brain and surrounding structures were obtained without and with intravenous contrast. CONTRAST:  68m MULTIHANCE GADOBENATE DIMEGLUMINE 529 MG/ML IV SOLN COMPARISON:  MRI head 11/03/2020 FINDINGS: Brain: Left occipital parietal craniotomy. Enhancing lesion in the left occipital parietal lobe is unchanged. This is located peripherally and is at the site of prior surgery. There is irregular enhancement with chronic hemorrhage which is stable. Surrounding FLAIR hyperintensity in the white matter also stable. No new lesion identified.  Ventricle size normal.  No acute infarct. Vascular: Normal arterial flow voids. Small developmental venous anomaly in the right cerebellum. Skull and upper cervical spine: Left occipital parietal craniotomy. No worrisome skull lesion. Sinuses/Orbits: Paranasal sinuses clear. Bilateral cataract extraction Other: None IMPRESSION: Stable MRI.  Postop resection and radiation to left occipital parietal lesion. Peripheral irregular enhancement and surrounding white matter hyperintensity is stable from the prior study. Possible treatment related enhancement. No new lesions. Electronically Signed   By: CFranchot GalloM.D.   On: 02/03/2021 16:00    Assessment/Plan Brain metastases ([facility]) [C79.31]  Jennifermarie S Mcclenney is clinically and radiographically stable today.  Continues to demonstrate good control of CNS burden with Osimertinib.   We appreciate the opportunity to participate in the care of [place]   We ask that MSTACI DACKreturn to clinic in 3 months following next brain MRI, or sooner as needed.  All questions were answered. The patient knows to call the clinic with any problems, questions or concerns. No barriers to learning were detected.  I have spent a total of 30 minutes of face-to-face and non-face-to-face time, excluding clinical staff time, preparing to see patient, ordering tests and/or medications, counseling the patient, and independently interpreting results and communicating results to the patient/family/caregiver   ZVentura Sellers MD Medical Director of Neuro-Oncology [facility]05/03/22 3:27 PM

## 2021-02-10 ENCOUNTER — Other Ambulatory Visit (HOSPITAL_COMMUNITY): Payer: Self-pay

## 2021-02-10 MED FILL — Osimertinib Mesylate Tab 80 MG (Base Equivalent): ORAL | 30 days supply | Qty: 30 | Fill #1 | Status: AC

## 2021-02-13 ENCOUNTER — Other Ambulatory Visit (HOSPITAL_COMMUNITY): Payer: Self-pay

## 2021-02-13 DIAGNOSIS — J9611 Chronic respiratory failure with hypoxia: Secondary | ICD-10-CM | POA: Diagnosis not present

## 2021-02-13 DIAGNOSIS — Z515 Encounter for palliative care: Secondary | ICD-10-CM | POA: Diagnosis not present

## 2021-02-13 DIAGNOSIS — C3492 Malignant neoplasm of unspecified part of left bronchus or lung: Secondary | ICD-10-CM | POA: Diagnosis not present

## 2021-02-13 DIAGNOSIS — Z902 Acquired absence of lung [part of]: Secondary | ICD-10-CM | POA: Diagnosis not present

## 2021-02-13 DIAGNOSIS — Z9981 Dependence on supplemental oxygen: Secondary | ICD-10-CM | POA: Diagnosis not present

## 2021-02-13 DIAGNOSIS — F3342 Major depressive disorder, recurrent, in full remission: Secondary | ICD-10-CM | POA: Diagnosis not present

## 2021-02-13 DIAGNOSIS — Z923 Personal history of irradiation: Secondary | ICD-10-CM | POA: Diagnosis not present

## 2021-02-13 DIAGNOSIS — C7931 Secondary malignant neoplasm of brain: Secondary | ICD-10-CM | POA: Diagnosis not present

## 2021-02-14 ENCOUNTER — Encounter (HOSPITAL_COMMUNITY): Payer: Self-pay | Admitting: Radiology

## 2021-02-14 ENCOUNTER — Ambulatory Visit (HOSPITAL_COMMUNITY)
Admission: RE | Admit: 2021-02-14 | Discharge: 2021-02-14 | Disposition: A | Discharge status: Home / Self Care | Payer: PPO | Source: Ambulatory Visit | Attending: Hematology and Oncology | Admitting: Hematology and Oncology

## 2021-02-14 DIAGNOSIS — I723 Aneurysm of iliac artery: Secondary | ICD-10-CM | POA: Diagnosis not present

## 2021-02-14 DIAGNOSIS — I700 Atherosclerosis of aorta: Secondary | ICD-10-CM | POA: Diagnosis not present

## 2021-02-14 DIAGNOSIS — C3432 Malignant neoplasm of lower lobe, left bronchus or lung: Secondary | ICD-10-CM | POA: Diagnosis not present

## 2021-02-14 DIAGNOSIS — C3492 Malignant neoplasm of unspecified part of left bronchus or lung: Secondary | ICD-10-CM | POA: Insufficient documentation

## 2021-02-14 DIAGNOSIS — I7772 Dissection of iliac artery: Secondary | ICD-10-CM | POA: Diagnosis not present

## 2021-02-14 DIAGNOSIS — I2510 Atherosclerotic heart disease of native coronary artery without angina pectoris: Secondary | ICD-10-CM | POA: Diagnosis not present

## 2021-02-14 DIAGNOSIS — I714 Abdominal aortic aneurysm, without rupture: Secondary | ICD-10-CM | POA: Diagnosis not present

## 2021-02-14 MED ORDER — IOHEXOL 300 MG/ML  SOLN
100.0000 mL | Freq: Once | INTRAMUSCULAR | Status: AC | PRN
  Administered 2021-02-14: 100 mL via INTRAVENOUS

## 2021-02-17 ENCOUNTER — Telehealth: Payer: Self-pay | Admitting: *Deleted

## 2021-02-17 NOTE — Telephone Encounter (Signed)
Received call from patient. She just wanted to inform Dr. [name] that she did not tolerate the oral contrast she drank for her recent CT Scan.  She states she had an upset stomach, headache, rapid heart rate after drinking it.  Advised that I would let Dr. [name] know of this. Pt appreciated this.  She is aware of her upcoming appts on 02/20/21  Dr. [name] made aware. Of the above

## 2021-02-19 IMAGING — CR PORTABLE CHEST - 1 VIEW
1 series · 2 of 2 positions shown · non-contrast
Comparison: Chest CT, 03/06/2019.  Chest radiograph, 10/19/2016.

CLINICAL DATA: Pt states she has been sob onset throughout the
night. Pt states she is on oxygen at home. Pt denies cp, fever, N/V
or injury to chest. Pt has hx of COPD.

EXAM:
PORTABLE CHEST 1 VIEW

[Series 1: portable · 0.17mm/px · 2 of 2 slices shown]
[im 1/2]
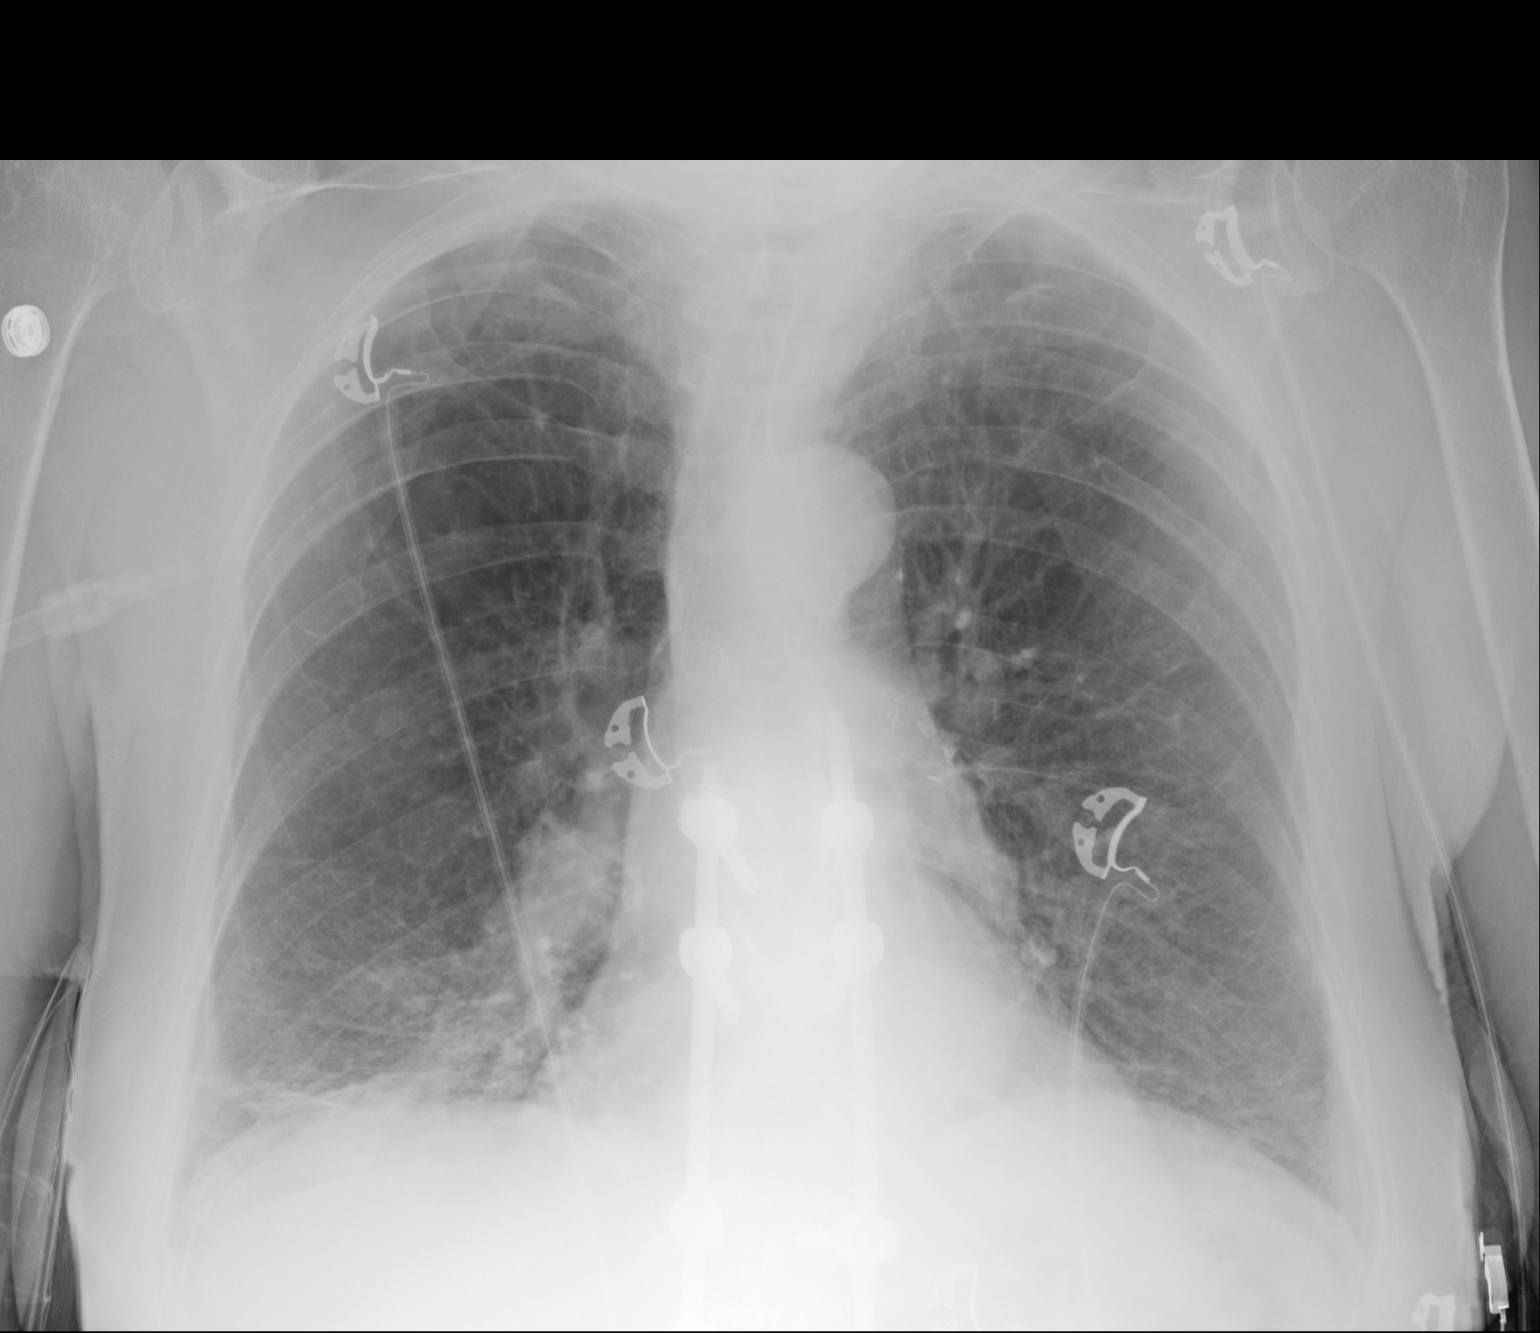
[im 2/2]
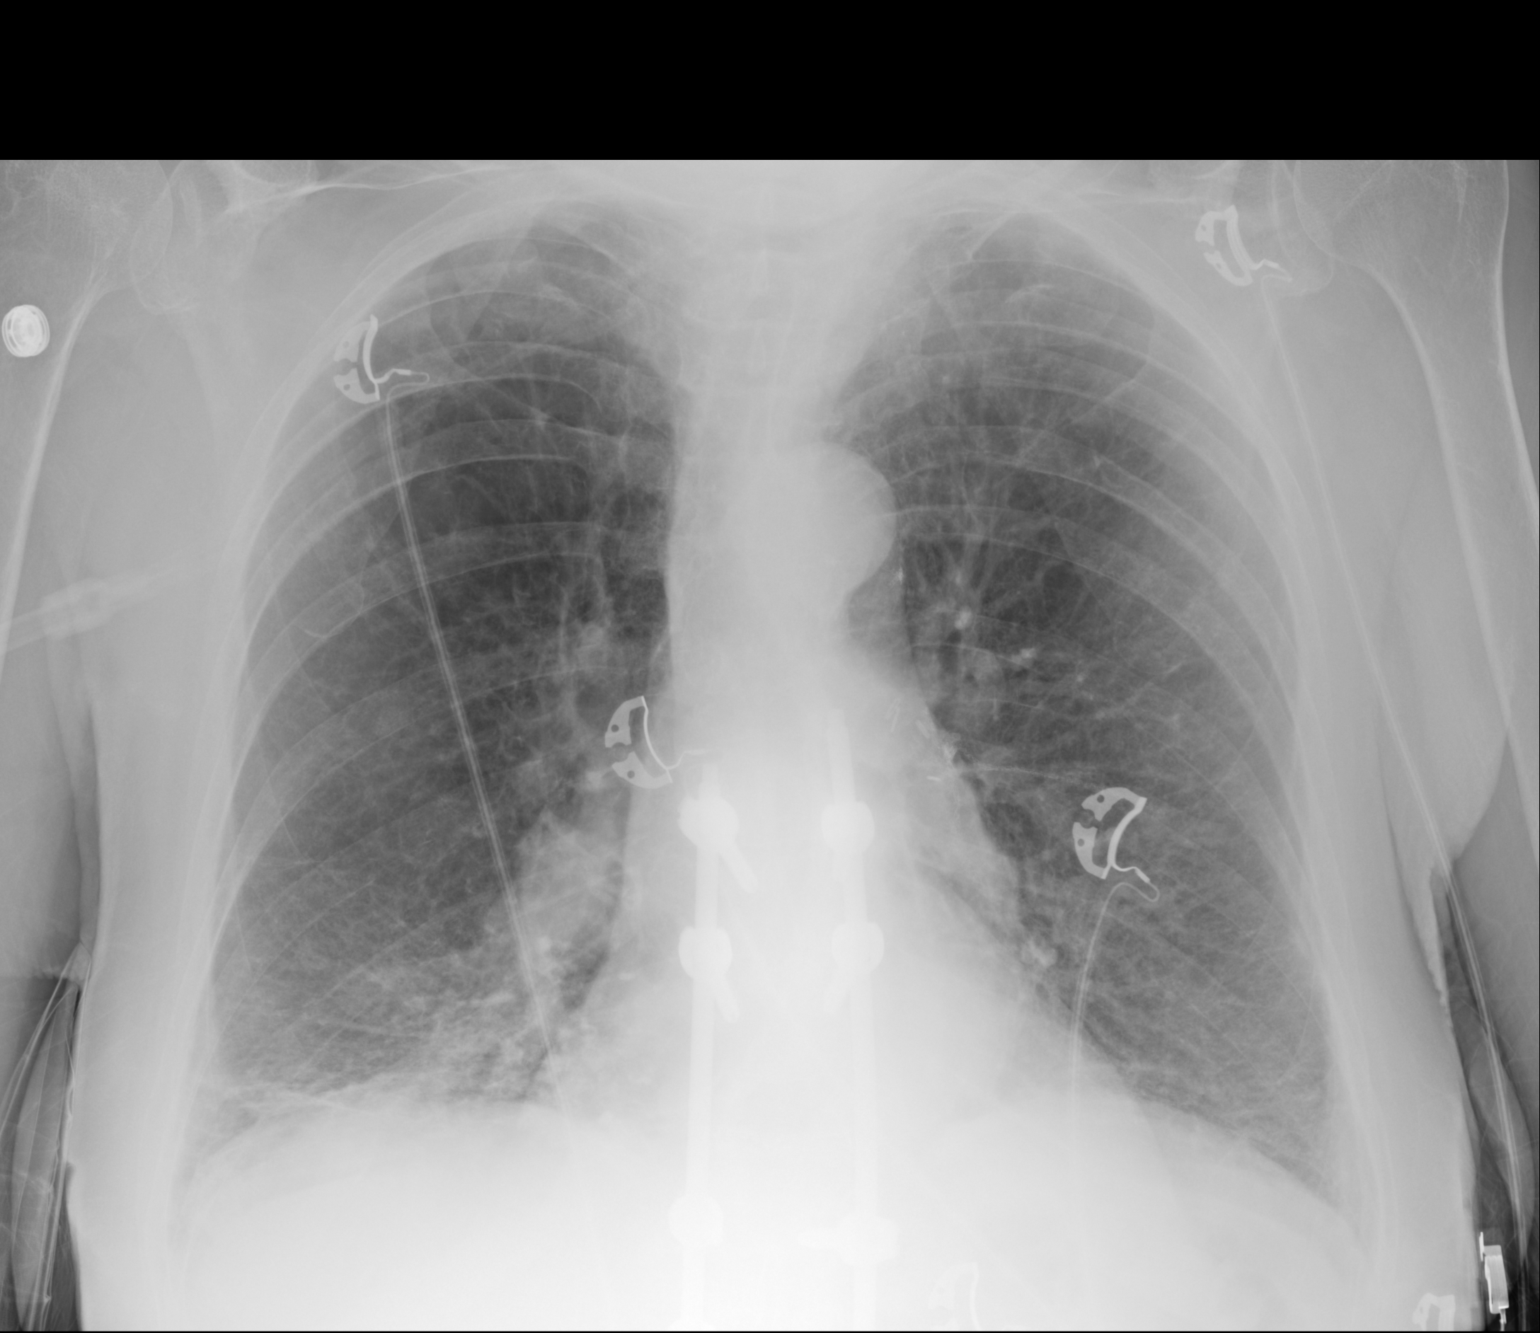

[2 of 2 positions shown; findings below may reference images not displayed]

FINDINGS: Cardiac silhouette is normal in size. No mediastinal or hilar
masses.

Lungs are hyperexpanded. There are thickened bronchovascular
markings. Interstitial thickening is noted most evident in the lower
lungs. There is linear opacities in both lung bases consistent with
scarring or chronic atelectasis. Milder scarring is noted in the
apices. No evidence of pneumonia or pulmonary edema.

No pleural effusion or pneumothorax.

Skeletal structures are grossly intact.
IMPRESSION: 1. No acute cardiopulmonary disease.
2. COPD with stable areas of lung scarring and stable basilar
atelectasis and or scarring.

## 2021-02-20 ENCOUNTER — Inpatient Hospital Stay: Payer: PPO

## 2021-02-20 ENCOUNTER — Other Ambulatory Visit: Payer: PPO

## 2021-02-20 ENCOUNTER — Inpatient Hospital Stay (HOSPITAL_BASED_OUTPATIENT_CLINIC_OR_DEPARTMENT_OTHER): Payer: PPO | Admitting: Hematology and Oncology

## 2021-02-20 ENCOUNTER — Ambulatory Visit: Payer: PPO | Admitting: Hematology and Oncology

## 2021-02-20 ENCOUNTER — Other Ambulatory Visit: Payer: Self-pay

## 2021-02-20 ENCOUNTER — Other Ambulatory Visit: Payer: Self-pay | Admitting: Hematology and Oncology

## 2021-02-20 VITALS — BP 106/77 | HR 64 | Temp 98.1°F | Resp 17 | Ht 60.0 in | Wt 114.5 lb

## 2021-02-20 DIAGNOSIS — C7931 Secondary malignant neoplasm of brain: Secondary | ICD-10-CM

## 2021-02-20 DIAGNOSIS — C3432 Malignant neoplasm of lower lobe, left bronchus or lung: Secondary | ICD-10-CM | POA: Diagnosis not present

## 2021-02-20 DIAGNOSIS — C3492 Malignant neoplasm of unspecified part of left bronchus or lung: Secondary | ICD-10-CM

## 2021-02-20 LAB — CBC WITH DIFFERENTIAL (CANCER CENTER ONLY)
Abs Immature Granulocytes: 0.02 10*3/uL (ref 0.00–0.07)
Basophils Absolute: 0 10*3/uL (ref 0.0–0.1)
Basophils Relative: 1 %
Eosinophils Absolute: 0.1 10*3/uL (ref 0.0–0.5)
Eosinophils Relative: 3 %
HCT: 35.8 % — ABNORMAL LOW (ref 36.0–46.0)
Hemoglobin: 11.8 g/dL — ABNORMAL LOW (ref 12.0–15.0)
Immature Granulocytes: 0 %
Lymphocytes Relative: 18 %
Lymphs Abs: 0.9 10*3/uL (ref 0.7–4.0)
MCH: 29.8 pg (ref 26.0–34.0)
MCHC: 33 g/dL (ref 30.0–36.0)
MCV: 90.4 fL (ref 80.0–100.0)
Monocytes Absolute: 0.5 10*3/uL (ref 0.1–1.0)
Monocytes Relative: 10 %
Neutro Abs: 3.4 10*3/uL (ref 1.7–7.7)
Neutrophils Relative %: 68 %
Platelet Count: 222 10*3/uL (ref 150–400)
RBC: 3.96 MIL/uL (ref 3.87–5.11)
RDW: 13 % (ref 11.5–15.5)
WBC Count: 4.9 10*3/uL (ref 4.0–10.5)
nRBC: 0 % (ref 0.0–0.2)

## 2021-02-20 LAB — CMP (CANCER CENTER ONLY)
ALT: 12 U/L (ref 0–44)
AST: 16 U/L (ref 15–41)
Albumin: 4.1 g/dL (ref 3.5–5.0)
Alkaline Phosphatase: 38 U/L (ref 38–126)
Anion gap: 6 (ref 5–15)
BUN: 23 mg/dL (ref 8–23)
CO2: 30 mmol/L (ref 22–32)
Calcium: 9.5 mg/dL (ref 8.9–10.3)
Chloride: 95 mmol/L — ABNORMAL LOW (ref 98–111)
Creatinine: 0.77 mg/dL (ref 0.44–1.00)
GFR, Estimated: >60 mL/min (ref >60)
Glucose, Bld: 88 mg/dL (ref 70–99)
Potassium: 4.7 mmol/L (ref 3.5–5.1)
Sodium: 131 mmol/L — ABNORMAL LOW (ref 135–145)
Total Bilirubin: 0.3 mg/dL (ref 0.3–1.2)
Total Protein: 6.6 g/dL (ref 6.5–8.1)

## 2021-02-20 NOTE — Progress Notes (Signed)
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 0/14 lymph nodes.  No perineural invasion or LVI.  negative margins 2) 03/03/2019: presented to ED with expressive aphasia, found to have multiple brain metastasis and underwent SRS on 03/17/2019. 3) 03/18/2019: Stereotactic left parietal craniotomy for resection of tumor 4) 04/30/2020: Brain MRI on showed 2 new subcentimeter meta stasis in the right cerebellum and right frontal lobe. 5)  05/20/2020: Stereotactic radiation therapy was completed 6) 06/23/2020: establish care with Dr. [name]  7) 08/29/2020: prescribed osimirtinib therapy due to next metastatic disease in the brain.  8) 09/05/2020: started osimirtinib 20m PO daily 9) 10/19/2020: CT Chest showed no evidence of residual disease 10) 11/21/2020: desquamation and erythema of finger tips. Concern for HFS. Temporarily holding therapy  11) 12/05/2020: restarted Tagrisso after resolution of the HFS symptoms  Interval History:  [name] 80y.o. female with medical history significant for metastatic adenocarcinoma of the lung with brain metastasis who presents for a follow up visit. The patient's last visit was on 01/19/2021.  In the interim since the last visit the patient has continued on her TSpringerwith no further HFS symptoms.   On exam today [name] reports she notes that her hands have been doing quite well but now they  are starting to crack again.  She reports that they are not quite painful but that she can "feel it".  She did run out of her hoof cream from [employer]and that may be the cause of the worsening of her fingers.  She has that she is also had issues with constipation following the p.o. contrast for her last scan.  She notes that she is taking stimulants and magnesium citrate in order to help move her bowels better.  She reports otherwise that her energy is quite good.  She did not do the activities she enjoys on a regular basis without limitation.  She is not having any other major symptoms as a result of her therapy.  She currently denies any fevers, chills, sweats, nausea vomiting or diarrhea.  A full ten point ROS is listed below.  MEDICAL HISTORY:  Past Medical History:  Diagnosis Date  . Adenocarcinoma of lung ([facility] 12/2010   left lung/surg only  . Allergic rhinitis   . 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[place]    corrected by surgery 08/21/13  . Anxiety disorder   . Aortic aneurysm ([facility]   . Brain metastasis ([place]    lung cancer s/p resection 7/20  . Cancer ([place]    Phreesia 05/26/2020  . Complication of anesthesia 2011   bloodpressure dropped during colonoscopy, not problems since  . Compression fracture   . COPD (chronic obstructive pulmonary disease) ([place]   . Depression   . Diastolic dysfunction   . Diverticulosis   . Dysphagia   . Emphysema lung ([place] 11/08/2014  . Headache   . Hiatal hernia   . History of kidney stones   . Hypothyroidism   . IBS (irritable bowel syndrome)   . Iron deficiency anemia due to  chronic blood loss 12/05/2016  . On home O2 12/15/2013   chronic hypoxia  . Pneumonia   . PUD (peptic ulcer disease)    50yr  . Shortness of breath    with exertion  . SIADH (syndrome of inappropriate ADH production) ([place]   . Trigeminal neuralgia     SURGICAL HISTORY: Past Surgical History:  Procedure Laterality Date  . ABDOMINAL HYSTERECTOMY    . APPLICATION OF CRANIAL  NAVIGATION N/A 03/18/2019   Procedure: APPLICATION OF CRANIAL NAVIGATION;  Surgeon: NConsuella Lose MD;  [facility]  Service: Neurosurgery;  Laterality: N/A;  . BACK SURGERY  January 13, 2014  . BRAIN SURGERY N/A    Phreesia 03/28/2020  . CATARACT EXTRACTION    . CATARACT EXTRACTION W/PHACO  09/02/2012   Procedure: CATARACT EXTRACTION PHACO AND INTRAOCULAR LENS PLACEMENT (IOC);  Surgeon: MElta GuadeloupeT. SGershon Crane MD;  Location: [facility];  Service: Ophthalmology;  Laterality: Left;  CDE:10.35  . CHOLECYSTECTOMY    . 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 COLONOSCOPY N/A 11/23/2014   Dr. FOneida Alar moderate diverticulosis, hemorrhoids, redundant colon. next TCS in 10-15 years.   . CRANIOTOMY Left 03/18/2019   Procedure: Left stereotactic craniotomy for tumor resection;  Surgeon: NConsuella Lose MD;  [facility]  Service: Neurosurgery;  Laterality: Left;  Left stereotactic craniotomy for tumor resection  . ENDARTERECTOMY Right 08/21/2013   Procedure: RIGHT CAROTID ANEURYSM RESECTION;  Surgeon: TRosetta Posner MD;  [place] [facility]  Service: Vascular;  Laterality: Right;  . ESOPHAGEAL DILATION  02/24/2018   Procedure: ESOPHAGEAL DILATION;  Surgeon: FDanie Binder MD;  [facility];  Service: Endoscopy;;  . ESOPHAGOGASTRODUODENOSCOPY  11/13/2009   w/dilation to 171m gastric ulceration (H.Pylori) s/p treatment  . ESOPHAGOGASTRODUODENOSCOPY  11/21/2009   distal esophageal web, gastritis  . ESOPHAGOGASTRODUODENOSCOPY  03/14/12   SLFXT:[id number] the distal esophagus/Mild gastritis/small HH. + H.pylori, prescribed Pylera. Finished treatment.   . ESOPHAGOGASTRODUODENOSCOPY N/A 12/13/2017   Dr. FiOneida Alaresophageal web s/p dilation, gastritis, no h.pylori  . ESOPHAGOGASTRODUODENOSCOPY N/A 02/24/2018   Dr. FiOneida Alarweb in prox esophagus and in distal esophagus s/p dilation, mild gastritis, mild pyloric stenosis, mild post ulcer duodenal deformity at D1/D2  .  fatty tumor removal from lt groin    . FRACTURE SURGERY Left    hand and left ring finger  . GIVENS CAPSULE STUDY N/A 12/26/2017   incomplete  . GIVENS CAPSULE STUDY N/A 02/24/2018   normal small bowel  . KNEE SURGERY     patella tendon repair june 2019 [facility]  . LUNG CANCER SURGERY  12/2010   Left VATS, minithoracotomy, LLL superior segmentectomy  . ORIF PATELLA Right 03/18/2018   Procedure: OPEN REDUCTION INTERNAL (ORIF) FIXATION RIGHT PATELLA;  Surgeon: HaCarole CivilMD;  Location: [facility];  Service: Orthopedics;  Laterality: Right;  . ORIF WRIST FRACTURE Left 04/09/2014   Procedure: OPEN REDUCTION INTERNAL FIXATION (ORIF) WRIST FRACTURE;  Surgeon: TiRenette ButtersMD;  [place] [facility] Service: Orthopedics;  Laterality: Left;  . PERCUTANEOUS PINNING Left 04/09/2014   Procedure: PERCUTANEOUS PINNING EXTREMITY;  Surgeon: TiRenette ButtersMD;  [facility]: [facility] Service: Orthopedics;  Laterality: Left;  . SAVORY DILATION N/A 12/13/2017   Procedure: SAVORY DILATION;  Surgeon: FiDanie BinderMD;  [facility];  Service: Endoscopy;  Laterality: N/A;  . TUBAL LIGATION    . vocal cord surgery  02/06/2011   laryngoscopy with bilateral vocal cord  Radiesse injection for vocal cord paralysis  . YAG LASER APPLICATION Left 16/07/9603   Procedure: YAG LASER APPLICATION;  Surgeon: Rutherford Guys, MD;  Location: [facility];  Service: Ophthalmology;  Laterality: Left;    SOCIAL HISTORY: Social History   Socioeconomic History  . Marital status: Divorced    Spouse name: Not on file  . Number of children: Not on file  . Years of education: Not on file  . Highest education level: Not on file  Occupational History  . Not on file  Tobacco Use  . Smoking status: Former Smoker    Packs/day: 0.50    Years: 20.00    Pack years: 10.00    Types: Cigarettes    Quit date: 12/21/2010    Years since quitting: 10.1  . Smokeless tobacco: Never Used  . Tobacco comment: smoking cessation info given  and reviewed   Vaping Use  . Vaping Use: Never used  Substance and Sexual Activity  . Alcohol use: Not Currently    Alcohol/week: 0.0 standard drinks  . Drug use: No  . Sexual activity: Yes    Birth control/protection: Surgical  Other Topics Concern  . Not on file  Social History Narrative   Divorced since 89.Lives alone with a dog.Marland KitchenRetired.  She receives Meals on Wheels.   Social Determinants of Health   Financial Resource Strain: Not on file  Food Insecurity: Not on file  Transportation Needs: Not on file  Physical Activity: Not on file  Stress: Not on file  Social Connections: Not on file  Intimate Partner Violence: Not on file    FAMILY HISTORY: Family History  Problem Relation Age of Onset  . Pulmonary embolism Mother   . Heart attack Father   . Hypertension Father   . Liver cancer Sister   . Cancer Sister   . Cancer Brother   . Cancer Daughter   . Breast cancer Daughter   . Colon cancer Neg Hx     ALLERGIES:  is allergic to ciprofloxacin hcl, sulfonamide derivatives, ciprofloxacin, iohexol, iodinated diagnostic agents, and prozac [fluoxetine hcl].  MEDICATIONS:  Current Outpatient Medications  Medication Sig Dispense Refill  . albuterol (VENTOLIN HFA) 108 (90 Base) MCG/ACT inhaler INHALE 2 PUFFS EVERY 6 HOURS AS NEEDED FOR WHEEZING OR SHORTNESS OF BREATH. 8.5 g 0  . ALPRAZolam (XANAX) 0.5 MG tablet TAKE (1) TABLET BY MOUTH TWICE A DAY AS NEEDED. 60 tablet 0  . citalopram (CELEXA) 40 MG tablet TAKE (1/2) TABLET BY MOUTH AT BEDTIME. 45 tablet 0  . diphenhydrAMINE (BENADRYL) 50 MG tablet Take 1 tablet (50 mg total) by mouth once for 1 dose. Take one hour prior to CT scan 1 tablet 0  . ferrous sulfate 325 (65 FE) MG tablet Take 325 mg by mouth daily with breakfast.    . fluticasone (FLONASE) 50 MCG/ACT nasal spray Place 2 sprays into both nostrils daily. 16 g 6  . Fluticasone-Umeclidin-Vilant (TRELEGY ELLIPTA) 100-62.5-25 MCG/INH AEPB INHALE 1 PUFF INTO LUNGS  DAILY. 60 each 6  . gabapentin (NEURONTIN) 100 MG capsule TAKE 1 CAPSULE BY MOUTH THREE TIMES A DAY. 90 capsule 6  . levocetirizine (XYZAL) 5 MG tablet TAKE ONE TABLET BY MOUTH ONCE DAILY. 30 tablet 6  . levothyroxine (SYNTHROID) 50 MCG tablet TAKE 1 TABLET BEFORE BREAKFAST. 90 tablet 0  . Multiple Vitamin (MULTIVITAMIN WITH MINERALS) TABS tablet Take 1 tablet by mouth daily.    . ondansetron (ZOFRAN) 4 MG tablet TAKE 1 TABLET BY MOUTH 3 TIMES DAILY AS  NEEDED FOR NAUSEA AND VOMITING. 90 tablet 0  . osimertinib mesylate (TAGRISSO) 80 MG tablet TAKE 1 TABLET (80 MG TOTAL) BY MOUTH DAILY. 30 tablet 2  . OVER THE COUNTER MEDICATION Bone Support 2-4 tabs daily    . OXcarbazepine (TRILEPTAL) 150 MG tablet TAKE (1) TABLET BY MOUTH TWICE DAILY. 60 tablet 6  . pantoprazole (PROTONIX) 40 MG tablet TAKE (1) TABLET BY MOUTH ONCE DAILY BEFORE BREAKFAST. 90 tablet 1  . predniSONE (DELTASONE) 50 MG tablet Please take one tablet at 13 hours, 7 hours and one hour prior to CT scan 3 tablet 0  . Probiotic Product (ALIGN PO) Take by mouth daily.    Marland Kitchen PROLIA 60 MG/ML SOSY injection INJECT INTO UPPER ARM, THIGH, OR ABDOMEN ONCE. 1 mL 0   No current facility-administered medications for this visit.    REVIEW OF SYSTEMS:   Constitutional: ( - ) fevers, ( - )  chills , ( - ) night sweats Eyes: ( - ) blurriness of vision, ( - ) double vision, ( - ) watery eyes Ears, nose, mouth, throat, and face: ( - ) mucositis, ( - ) sore throat Respiratory: ( - ) cough, ( - ) dyspnea, ( - ) wheezes Cardiovascular: ( - ) palpitation, ( - ) chest discomfort, ( - ) lower extremity swelling Gastrointestinal:  ( - ) nausea, ( - ) heartburn, ( - ) change in bowel habits Skin: ( - ) abnormal skin rashes Lymphatics: ( - ) new lymphadenopathy, ( - ) easy bruising Neurological: ( - ) numbness, ( - ) tingling, ( - ) new weaknesses Behavioral/Psych: ( - ) mood change, ( - ) new changes  All other systems were reviewed with the patient and  are negative.  PHYSICAL EXAMINATION: Telephone Visit  LABORATORY DATA:  I have reviewed the data as listed CBC Latest Ref Rng & Units 02/20/2021 01/19/2021 12/22/2020  WBC 4.0 - 10.5 K/uL 4.9 4.8 5.2  Hemoglobin 12.0 - 15.0 g/dL 11.8(L) 11.0(L) 11.6(L)  Hematocrit 36.0 - 46.0 % 35.8(L) 33.7(L) 35.9(L)  Platelets 150 - 400 K/uL 222 207 212    CMP Latest Ref Rng & Units 02/20/2021 01/19/2021 12/22/2020  Glucose 70 - 99 mg/dL 88 87 86  BUN 8 - 23 mg/dL 23 16 18   Creatinine 0.44 - 1.00 mg/dL 0.77 0.63 0.64  Sodium 135 - 145 mmol/L 131(L) 133(L) 132(L)  Potassium 3.5 - 5.1 mmol/L 4.7 4.8 4.9  Chloride 98 - 111 mmol/L 95(L) 96(L) 98  CO2 22 - 32 mmol/L 30 28 30   Calcium 8.9 - 10.3 mg/dL 9.5 9.2 9.4  Total Protein 6.5 - 8.1 g/dL 6.6 6.6 6.8  Total Bilirubin 0.3 - 1.2 mg/dL 0.3 0.3 0.5  Alkaline Phos 38 - 126 U/L 38 46 55  AST 15 - 41 U/L 16 20 20   ALT 0 - 44 U/L 12 15 21     RADIOGRAPHIC STUDIES:  MRI Brain 08/05/2020:   CLINICAL DATA:  Trigeminal neuralgia. Headache, cluster/trigeminal. Brain/CNS neoplasm, assess treatment response. Metastatic lung cancer. Additional history provided: SRS to 5 brain metastases 03/17/2019, left parietal craniotomy for tumor resection 03/18/2019, SRS to 2 additional metastases 01/01/2020.  EXAM: MRI HEAD WITHOUT AND WITH CONTRAST  MRA HEAD WITHOUT CONTRAST  TECHNIQUE: Multiplanar, multiecho pulse sequences of the brain and surrounding structures were obtained without and with intravenous contrast. Angiographic images of the head were obtained using MRA technique without contrast.  CONTRAST:  43m MULTIHANCE GADOBENATE DIMEGLUMINE 529 MG/ML IV SOLN  COMPARISON:  Prior  brain MRI examinations 04/30/2020 and earlier. MRA head 03/04/2019.  FINDINGS: MRI HEAD FINDINGS  Brain:  Stable generalized cerebral atrophy.  Again demonstrated is a hemosiderin stained left parietooccipital resection cavity. Irregular and gyriform enhancement  has increased from the prior examination, now measuring up to 12 mm in thickness (for instance as seen on series 14, image 90) (series 15, image 9). This irregular enhancement extends to the site of a previously demonstrated 3 mm nodular focus of enhancement within the superolateral aspect of the cavity. Surrounding T2/FLAIR hyperintense signal abnormality has not significantly changed.  A previously demonstrated 4 mm lesion within the medial right cerebellum is no longer appreciated.  A 4 mm enhancing lesion within the left cerebellar hemisphere was present on the prior examination but seen to better advantage on today's study (series 14, image 48).  New 2 mm enhancing lesion more posteriorly within the medial left cerebellar hemisphere (series 14, image 50).  New 2 mm enhancing lesion within the lateral left cerebellum (series 14, image 42).  A 3 mm lesion within the high posterior right frontal lobe has decreased in size (series 14, image 145) (previously 5 mm).  A previously demonstrated 2 mm enhancing lesion within the left frontal lobe has increased in size, now measuring 8 mm (series 14, image 125) (series 11, image 39) . The lesion now demonstrates central T2/FLAIR hyperintensity and T1 hyperintensity with a peripheral low signal rim. Precontrast T1 hyperintensity limits evaluation for enhancement at this site.  Multifocal T2/FLAIR hyperintensity elsewhere within the cerebral white matter and within the pons is nonspecific, but compatible chronic small vessel ischemic disease.  No evidence of acute infarction.  No extra-axial fluid collection.  No midline shift.  Vascular: Expected proximal arterial flow voids.  Skull and upper cervical spine: No focal suspicious marrow lesion.  Sinuses/Orbits: Visualized orbits show no acute finding. Trace ethmoid sinus mucosal thickening. No significant mastoid effusion.  MRA HEAD FINDINGS  The intracranial  internal carotid arteries are patent. The M1 middle cerebral arteries are patent without significant stenosis. No M2 proximal branch occlusion or high-grade proximal stenosis is identified. The anterior cerebral arteries are patent. 1-2 mm inferiorly projecting vascular protrusion arising from the supraclinoid right ICA which may reflect an infundibulum or small aneurysm (series 103, image 12).  The intracranial vertebral arteries are patent. The basilar artery is patent. The posterior cerebral arteries are patent.  IMPRESSION: MRI brain:  1. Irregular and gyriform enhancement at site of the left parietooccipital resection cavity has increased, now measuring up to 12 mm in thickness. This enhancement extends to the site of a previously demonstrated 3 mm nodular enhancing focus within the superolateral aspect of the cavity. Findings may reflect post-treatment inflammation or recurrent tumor. Short interval MRI follow-up is recommended. Surrounding T2 hyperintense signal abnormality has not appreciably changed. 2. New 2 mm metastasis within the posteromedial left cerebellar hemisphere. 3. New 2 mm metastasis within the lateral left cerebellum. 4. An additional 4 mm metastasis within the left cerebellum was present on the prior MRI of 04/30/2020, but is seen to better advantage on today's study. 5. A previously demonstrated 4 mm metastasis within the medial right cerebellum is no longer appreciated 6. 8 mm metastasis within the paramedian left frontal lobe, increased in size and now demonstrating precontrast T1 hyperintensity which limits evaluation for enhancement. 7. A 3 mm lesion within the high posterior right frontal lobe has decreased in size.  MRA head:  1. No intracranial large vessel occlusion or proximal high-grade arterial  stenosis. 2. 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   Electronically Signed   By: Kellie Simmering DO   On: 08/05/2020 13:49  MR BRAIN W WO CONTRAST  Result Date: 02/03/2021 CLINICAL DATA:  Metastatic lung cancer.  Assess treatment response. EXAM: MRI HEAD WITHOUT AND WITH CONTRAST TECHNIQUE: Multiplanar, multiecho pulse sequences of the brain and surrounding structures were obtained without and with intravenous contrast. CONTRAST:  84m MULTIHANCE GADOBENATE DIMEGLUMINE 529 MG/ML IV SOLN COMPARISON:  MRI head 11/03/2020 FINDINGS: Brain: Left occipital parietal craniotomy. Enhancing lesion in the left occipital parietal lobe is unchanged. This is located peripherally and is at the site of prior surgery. There is irregular enhancement with chronic hemorrhage which is stable. Surrounding FLAIR hyperintensity in the white matter also stable. No new lesion identified.  Ventricle size normal.  No acute infarct. Vascular: Normal arterial flow voids. Small developmental venous anomaly in the right cerebellum. Skull and upper cervical spine: Left occipital parietal craniotomy. No worrisome skull lesion. Sinuses/Orbits: Paranasal sinuses clear. Bilateral cataract extraction Other: None IMPRESSION: Stable MRI. Postop resection and radiation to left occipital parietal lesion. Peripheral irregular enhancement and surrounding white matter hyperintensity is stable from the prior study. Possible treatment related enhancement. No new lesions. Electronically Signed   By: CFranchot GalloM.D.   On: 02/03/2021 16:00   CT CHEST ABDOMEN PELVIS W CONTRAST  Result Date: 02/15/2021 CLINICAL DATA:  Metastatic left lower lobe lung cancer, assess treatment response EXAM: CT CHEST, ABDOMEN, AND PELVIS WITH CONTRAST TECHNIQUE: Multidetector CT imaging of the chest, abdomen and pelvis was performed following the standard protocol during bolus administration of intravenous contrast. CONTRAST:  103mOMNIPAQUE IOHEXOL 300 MG/ML SOLN, additional oral enteric contrast COMPARISON:  CT chest, 10/19/2020, 06/22/2020, CT abdomen  pelvis, 12/07/2019 FINDINGS: CT CHEST FINDINGS Cardiovascular: Aortic atherosclerosis. Normal heart size. Scattered coronary artery calcifications. No pericardial effusion. Mediastinum/Nodes: No enlarged mediastinal, hilar, or axillary lymph nodes. Thyroid gland, trachea, and esophagus demonstrate no significant findings. Lungs/Pleura: Severe centrilobular emphysema. Redemonstrated postoperative findings of left lower lobe superior segmentectomy. No pleural effusion or pneumothorax. Musculoskeletal: No chest wall mass or suspicious bone lesions identified. Osteopenia. Numerous wedge deformities of the thoracic spine, not significantly changed, notable for high-grade deformities of T6 T9 T12, and posterior thoracolumbar fusion from T10 through L2. CT ABDOMEN PELVIS FINDINGS Hepatobiliary: No focal liver abnormality is seen. Status post cholecystectomy. No biliary dilatation. Pancreas: Unremarkable. No pancreatic ductal dilatation or surrounding inflammatory changes. Spleen: Normal in size without significant abnormality. Adrenals/Urinary Tract: Adrenal glands are unremarkable. Kidneys are normal, without renal calculi, solid lesion, or hydronephrosis. Bladder is unremarkable. Stomach/Bowel: Stomach is within normal limits. Appendix is not clearly visualized. No evidence of bowel wall thickening, distention, or inflammatory changes. Vascular/Lymphatic: Aortic atherosclerosis. Tortuous abdominal aorta with infrarenal abdominal aortic aneurysm measuring up to 3.4 x 3.1 cm (series 4, image 80). 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 No enlarged abdominal or pelvic lymph nodes. Reproductive: Status post hysterectomy. Other: No abdominal wall hernia or abnormality. No abdominopelvic ascites. Musculoskeletal: No acute or significant osseous findings. IMPRESSION: 1. Redemonstrated postoperative findings of left lower lobe superior  segmentectomy. 2. There are no distinctly appreciated pulmonary nodules remaining in the left lower lobe. 3. No evidence of recurrent or metastatic disease in the chest, abdomen, or pelvis. 4. Tortuous abdominal aorta with infrarenal abdominal aortic aneurysm measuring up to 3.4 x 3.1  cm. 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 Recommend follow-up ultrasound every 3 years if not otherwise imaged. This recommendation follows ACR consensus guidelines: White Paper of the ACR Incidental Findings Committee II on Vascular Findings. J Am Coll Radiol 2013; 27:782-423. 5. Coronary artery disease. 6. Emphysema. Aortic Atherosclerosis (ICD10-I70.0) and Emphysema (ICD10-J43.9). Electronically Signed   By: Eddie Candle M.D.   On: 02/15/2021 09:38    ASSESSMENT & PLAN [name] 80 y.o. female with medical history significant for metastatic adenocarcinoma of the lung with brain metastasis who presents for a follow up visit.  After review the records, review of the labs, review the imaging, discussion with the patient the findings are most consistent with a stage IV adenocarcinoma of the lung with a small lung lesion and metastatic spread to the brain.  Her metastatic brain mets have been treated with resection of tumor on 03/18/2019 as well as SRS on 12/12/2019.  Most recently she was found to have 2 new subcentimeter lesions and underwent stereotactic radiation therapy which was completed on 05/20/2020. Unfortunately on 08/05/2020 she was noted to have new CNS metastatic disease.   There are limitations in what therapies can be used for this patient given her poor functional status.  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  After extensive discussions with the patient's about the risks and benefits of this treatment she was agreeable to starting osimertinib 80 mg p.o. daily for her metastatic adenocarcinoma  of the lung. She started this treatment on 09/05/2020.  On discussion today [name] is tolerating Tagrisso well and is willing and able to proceed at this time.  Restaging CT scans are planned for August 2022.   #Stage IV ([id number]) adenocarcinoma of the lung: -Left lower lobe resection on 12/21/2010, 14 lymph nodes negative, 2.2 cm tumor size, margins negative. -most recent MRI brain scan shows stable disease. No evidence of Chest/abdomen involvement on scan from 10/19/2020.  --started therapy with osimertinib 80 mg PO daily for her EGFR Exon 19 mutation. She started on 09/05/2020 -CT chest with contrast on 10/20/2019 showed no active disease in the chest. Continue CT scans q 3 months.  -PD-L1 testing was 1%. -- Patient is a poor candidate for systemic therapy, though she does have an EGFR Exon 19 mutation.  --RTC in 4 weeks time to assure continued tolerance of osimirtenib.   #Hand Foot Syndrome, improved --previously noted to have desquamation and erythema of finger tips. Resolved on exam today.  --held therapy for 1-2 weeks until resolution --patient had resolution of symptoms after 2 weeks off therapy. Encouraged continue aggressive moisturizing lotion.   #Brain metastasis, stable -Presentation with expressive aphasia to ER on 03/03/2019 along with right-sided weakness. Found to have multiple brain metastasis and underwent SRS on 03/17/2019. 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 -Underwent SRS on 01/01/2020 for 2 subcentimeter brain lesions. -Brain MRI on 04/30/2020 showed 2 new subcentimeter meta stasis in the right cerebellum and right frontal lobe. -Stereotactic radiation therapy was completed on 05/20/2020 --most recent MRI on 02/03/2021 showed stable disease. Continue systemic therapy as above.   No orders of the defined types were placed in this encounter.   All questions were answered.  The patient knows to call the clinic with any problems, questions or concerns.  A total of more than 30  minutes were spent on this encounter and over half of that time was spent on counseling and coordination of care as outlined above.   Ledell Peoples, MD Department of Hematology/Oncology [place] at [facility] Phone: [phone] Pager: [phone] Email: [email or web address]  02/21/2021 11:44 AM    Suszanne Conners HA, Su JM, [place] [facility], Rowe Pavy, [place], [place]. 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 J Thorac Oncol. 2020 Nov;15(11):1758-1766. doi: 10.1016/j.jtho.2020.06.018. Epub 2020 Jul 9. PMID: [id number].  --Osimertinib is a promising treatment option for EGFR-mutated NSCLC with LM regardless of T790M mutational status.

## 2021-02-21 ENCOUNTER — Encounter: Payer: Self-pay | Admitting: Hematology and Oncology

## 2021-02-22 ENCOUNTER — Telehealth: Payer: Self-pay | Admitting: Hematology and Oncology

## 2021-02-22 NOTE — Telephone Encounter (Signed)
Scheduled per los. Called and spoke with patient. Confirmed appt 

## 2021-02-27 ENCOUNTER — Other Ambulatory Visit: Payer: Self-pay | Admitting: Family Medicine

## 2021-02-28 ENCOUNTER — Encounter: Payer: Self-pay | Admitting: Hematology and Oncology

## 2021-03-01 DIAGNOSIS — C3490 Malignant neoplasm of unspecified part of unspecified bronchus or lung: Secondary | ICD-10-CM | POA: Diagnosis not present

## 2021-03-01 DIAGNOSIS — R0902 Hypoxemia: Secondary | ICD-10-CM | POA: Diagnosis not present

## 2021-03-01 DIAGNOSIS — J449 Chronic obstructive pulmonary disease, unspecified: Secondary | ICD-10-CM | POA: Diagnosis not present

## 2021-03-01 DIAGNOSIS — J9621 Acute and chronic respiratory failure with hypoxia: Secondary | ICD-10-CM | POA: Diagnosis not present

## 2021-03-09 ENCOUNTER — Other Ambulatory Visit: Payer: Self-pay | Admitting: Radiation Therapy

## 2021-03-10 ENCOUNTER — Other Ambulatory Visit: Payer: Self-pay | Admitting: Hematology and Oncology

## 2021-03-10 ENCOUNTER — Other Ambulatory Visit (HOSPITAL_COMMUNITY): Payer: Self-pay

## 2021-03-10 MED ORDER — OSIMERTINIB MESYLATE 80 MG PO TABS
ORAL_TABLET | Freq: Every day | ORAL | 2 refills | Status: DC
  Filled 2021-03-10: qty 30, 30d supply, fill #0
  Filled 2021-04-06: qty 30, 30d supply, fill #1
  Filled 2021-05-04: qty 30, 30d supply, fill #2

## 2021-03-14 ENCOUNTER — Other Ambulatory Visit (HOSPITAL_COMMUNITY): Payer: Self-pay

## 2021-03-17 NOTE — Telephone Encounter (Signed)
error 

## 2021-03-20 ENCOUNTER — Ambulatory Visit (INDEPENDENT_AMBULATORY_CARE_PROVIDER_SITE_OTHER): Payer: PPO | Admitting: Pharmacist

## 2021-03-20 DIAGNOSIS — I10 Essential (primary) hypertension: Secondary | ICD-10-CM

## 2021-03-20 DIAGNOSIS — J449 Chronic obstructive pulmonary disease, unspecified: Secondary | ICD-10-CM

## 2021-03-20 DIAGNOSIS — C7931 Secondary malignant neoplasm of brain: Secondary | ICD-10-CM

## 2021-03-20 NOTE — Patient Instructions (Signed)
Visit Information   Goals Addressed             This Visit's Progress    Manage Fatigue (Tiredness- Cancer Treatment)   On track    Follow Up Date 10/06/21   - eat healthy - get outdoors every day (weather permitting) - use devices that will help like a cane, sock-puller or reacher    Why is this important?   Cancer treatment and its side effects can drain your energy. It can keep you from doing things you would like to do.  There are many things that you can do to manage fatigue.    Notes        Patient Care Plan: General Pharmacy (Adult)     Problem Identified: HTN. Adenocarcinoma of lung w/ brain metastases, COPD, HLD   Priority: High  Onset Date: 11/15/2020     Long-Range Goal: Patient-Specific Goal   Start Date: 11/15/2020  Expected End Date: 05/15/2021  Recent Progress: On track  Priority: High  Note:   Current Barriers:  No true barriers identified at this visit   Pharmacist Clinical Goal(s):  Over the next 120 days, patient will work toward alleviating adverse effects from treatment through collaboration with PharmD and provider.   Interventions: 1:1 collaboration with [name], MD regarding development and update of comprehensive plan of care as evidenced by provider attestation and co-signature Inter-disciplinary care team collaboration (see longitudinal plan of care) Comprehensive medication review performed; medication list updated in electronic medical record  Hypertension (BP goal <140/90) -controlled -Current treatment: No medications at this time -Medications previously tried: none -Current home readings: none available  -Denies hypotensive/hypertensive symptoms -Educated on Importance of home blood pressure monitoring; Symptoms of hypotension and importance of maintaining adequate hydration; -Counseled to monitor BP at home periodically, document, and provide log at future appointments -Counseled on diet and exercise extensively  -  Continue current management strategy  Update 03/20/21 No changes to meds Denies any dizziness or HA'S  Hyperlipidemia: (LDL goal < 100) -uncontrolled -Current treatment: No medications -Medications previously tried: pravastatin, atorvastatin (unkown why d/c)  -Educated on Cholesterol goals;  -Counseled on diet and exercise extensively recommend to continue current management strategy, would hesitate to recommend statin given her age and other comonbidities   COPD (Goal: control symptoms and prevent exacerbations) -controlled -Current treatment  Trelegy 100-62.5-25 mcg one puff daily Albuterol HFA 90 mcg -Medications previously tried: none noted   -Exacerbations requiring treatment in last 6 months:  none -Patient reports consistent use of maintenance inhaler -Frequency of rescue inhaler use: rarely -Counseled on Proper inhaler technique; Differences between maintenance and rescue inhalers -Recommended to continue current medication Assessed frequency of SOB episodes and inhaler usage  Update 03/20/21 Continues to use Trelegy as maintenance Rarely has to use Albuterol Reports breathing is controlled  Lung Cancer w/ brain metastases (Goal: Comfort care, minimize symptoms and adverse effects from treatment) -controlled -Current treatment  Tagrisso 80mg   -Medications previously tried: none noted  -Counseled on use of O'Keefes working hands cream for patient having extreme dry hands and fingertips as a result of new treatment with Tagrisso  - Lesions on brain have cleared - Recommend continue current medication  Update 03/20/21 Continues to have dry hands and fingers, O'keefes has been somewhat effective States it is tolerable and she continues to be in good spirits  Patient Goals/Self-Care Activities Over the next 120 days, patient will:  - take medications as prescribed Try O'Keefes working hand cream and report any  increasing or worsening adverse effects to  providers  Follow Up Plan: The care management team will reach out to the patient again over the next 120 days.        The patient verbalized understanding of instructions, educational materials, and care plan provided today and agreed to receive a mailed copy of patient instructions, educational materials, and care plan.  Telephone follow up appointment with pharmacy team member scheduled for: 6 months  Edythe Clarity, [facility]

## 2021-03-20 NOTE — Progress Notes (Signed)
Chronic Care Management Pharmacy Note  03/20/2021 Name:  [name] MRN:  [id number] DOB:  04-May-1941  Summary: Follow up with PharmD.  No changes patient continues tolerate all treatments.  Recommendations/Changes made from today's visit: NONE  Plan: 6 month fu   Subjective: [name] is an 80 y.o. year old female who is a primary patient of Pickard, Cammie Mcgee, MD.  The CCM team was consulted for assistance with disease management and care coordination needs.    Engaged with patient by telephone for follow up visit in response to provider referral for pharmacy case management and/or care coordination services.   Consent to Services:  The patient was given the following information about Chronic Care Management services today, agreed to services, and gave verbal consent: 1. CCM service includes personalized support from designated clinical staff supervised by the primary care provider, including individualized plan of care and coordination with other care providers 2. 24/7 contact phone numbers for assistance for urgent and routine care needs. 3. Service will only be billed when office clinical staff spend 20 minutes or more in a month to coordinate care. 4. Only one practitioner may furnish and bill the service in a calendar month. 5.The patient may stop CCM services at any time (effective at the end of the month) by phone call to the office staff. 6. The patient will be responsible for cost sharing (co-pay) of up to 20% of the service fee (after annual deductible is met). Patient agreed to services and consent obtained.  Patient Care Team: [name], MD as PCP - General (Family Medicine) Herminio Commons, MD (Inactive) as PCP - Cardiology (Cardiology) Danie Binder, MD (Inactive) (Gastroenterology) Nicanor Alcon, MD (Thoracic Surgery) Everardo All, MD (Hematology and Oncology) Edythe Clarity, [facility] as Pharmacist (Pharmacist)  Recent office visits: None  since last Ccm visit  Recent consult visits: 02/20/21 [name], oncology) - no med changes, still stable metastatic disease  01/19/21 [name], oncology) - regular follow up, no medication changes Hospital visits: None in previous 6 months   Objective:  Lab Results  Component Value Date   CREATININE 0.77 02/20/2021   BUN 23 02/20/2021   GFR 107.85 02/16/2016   GFRNONAA >60 02/20/2021   GFRAA >60 06/20/2020   NA 131 (L) 02/20/2021   K 4.7 02/20/2021   CALCIUM 9.5 02/20/2021   CO2 30 02/20/2021   GLUCOSE 88 02/20/2021    Lab Results  Component Value Date/Time   HGBA1C 5.1 03/08/2019 04:26 AM   GFR 107.85 02/16/2016 11:50 AM    Last diabetic Eye exam: No results found for: HMDIABEYEEXA  Last diabetic Foot exam: No results found for: HMDIABFOOTEX   Lab Results  Component Value Date   CHOL 220 (H) 03/08/2019   HDL 54 03/08/2019   LDLCALC 155 (H) 03/08/2019   TRIG 57 03/08/2019   CHOLHDL 4.1 03/08/2019    Hepatic Function Latest Ref Rng & Units 02/20/2021 01/19/2021 12/22/2020  Total Protein 6.5 - 8.1 g/dL 6.6 6.6 6.8  Albumin 3.5 - 5.0 g/dL 4.1 4.0 4.1  AST 15 - 41 U/L 16 20 20   ALT 0 - 44 U/L 12 15 21   Alk Phosphatase 38 - 126 U/L 38 46 55  Total Bilirubin 0.3 - 1.2 mg/dL 0.3 0.3 0.5    Lab Results  Component Value Date/Time   TSH 2.339 12/22/2020 10:39 AM   TSH 2.307 11/21/2020 10:43 AM   TSH 4.09 01/26/2020 04:23 PM   TSH 1.20 11/27/2019  02:48 PM   FREET4 0.97 06/01/2015 08:23 AM    CBC Latest Ref Rng & Units 02/20/2021 01/19/2021 12/22/2020  WBC 4.0 - 10.5 K/uL 4.9 4.8 5.2  Hemoglobin 12.0 - 15.0 g/dL 11.8(L) 11.0(L) 11.6(L)  Hematocrit 36.0 - 46.0 % 35.8(L) 33.7(L) 35.9(L)  Platelets 150 - 400 K/uL 222 207 212    No results found for: VD25OH  Clinical ASCVD: No  The 10-year ASCVD risk score Mikey Bussing DC Jr., et al., 2013) is: 17.2%   Values used to calculate the score:     Age: 39 years     Sex: Female     Is Non-Hispanic African American: No      Diabetic: No     Tobacco smoker: No     Systolic Blood Pressure: 798 mmHg     Is BP treated: No     HDL Cholesterol: 54 mg/dL     Total Cholesterol: 220 mg/dL    No flowsheet data found.   Social History   Tobacco Use  Smoking Status Former   Packs/day: 0.50   Years: 20.00   Pack years: 10.00   Types: Cigarettes   Quit date: 12/21/2010   Years since quitting: 10.2  Smokeless Tobacco Never  Tobacco Comments   smoking cessation info given and reviewed    BP Readings from Last 3 Encounters:  02/20/21 106/77  02/07/21 103/79  01/19/21 117/78   Pulse Readings from Last 3 Encounters:  02/20/21 64  02/07/21 93  01/19/21 76   Wt Readings from Last 3 Encounters:  02/20/21 114 lb 8 oz (51.9 kg)  02/07/21 112 lb (50.8 kg)  01/19/21 114 lb 6.4 oz (51.9 kg)   BMI Readings from Last 3 Encounters:  02/20/21 22.36 kg/m  02/07/21 21.87 kg/m  01/19/21 22.34 kg/m    Assessment/Interventions: Review of patient past medical history, allergies, medications, health status, including review of consultants reports, laboratory and other test data, was performed as part of comprehensive evaluation and provision of chronic care management services.   SDOH:  (Social Determinants of Health) assessments and interventions performed: Yes  SDOH Screenings   Alcohol Screen: Not on file  Depression (PHQ2-9): Not on file  Financial Resource Strain: Not on file  Food Insecurity: Not on file  Housing: Not on file  Physical Activity: Not on file  Social Connections: Not on file  Stress: Not on file  Tobacco Use: Medium Risk   Smoking Tobacco Use: Former   Smokeless Tobacco Use: Never  Transportation Needs: Not on file    CCM Care Plan  Allergies  Allergen Reactions   Ciprofloxacin Hcl Hives   Sulfonamide Derivatives Hives and Rash   Ciprofloxacin Hives   Iohexol Other (See Comments)    UNSPECIFIED REACTION  Desc: Per [facility], pt is allergic to IV contrast, no type of  reaction was available. Pt cannot remember but first reacted around 15 yrs ago from an IVP. Notes were date from 2009 at urology center., Onset Date: [id number]    Iodinated Diagnostic Agents Rash and Itching   Prozac [Fluoxetine Hcl] Rash    Medications Reviewed Today     Reviewed by Orson Slick, MD (Physician) on 02/21/21 at 1144  Med List Status: <None>   Medication Order Taking? Sig Documenting Provider Last Dose Status Informant  albuterol (VENTOLIN HFA) 108 (90 Base) MCG/ACT inhaler [id number] No INHALE 2 PUFFS EVERY 6 HOURS AS NEEDED FOR WHEEZING OR SHORTNESS OF BREATH. [name], MD Taking Active   ALPRAZolam (  XANAX) 0.5 MG tablet [id number] No TAKE (1) TABLET BY MOUTH TWICE A DAY AS NEEDED. [name], MD Taking Active   citalopram (CELEXA) 40 MG tablet [id number] No TAKE (1/2) TABLET BY MOUTH AT BEDTIME. [name], MD Taking Active   diphenhydrAMINE (BENADRYL) 50 MG tablet [id number]  Take 1 tablet (50 mg total) by mouth once for 1 dose. Take one hour prior to CT scan Derek Jack, MD  Expired 06/20/20 2359   ferrous sulfate 325 (65 FE) MG tablet [id number] No Take 325 mg by mouth daily with breakfast. [provider] Taking Active Self  fluticasone (FLONASE) 50 MCG/ACT nasal spray [id number] No Place 2 sprays into both nostrils daily. [name], MD Taking Active   Fluticasone-Umeclidin-Vilant (TRELEGY ELLIPTA) 100-62.5-25 MCG/INH AEPB [id number] No INHALE 1 PUFF INTO LUNGS DAILY. [name], MD Taking Active   gabapentin (NEURONTIN) 100 MG capsule [id number] No TAKE 1 CAPSULE BY MOUTH THREE TIMES A DAY. [name], MD Taking Active   levocetirizine (XYZAL) 5 MG tablet [id number] No TAKE ONE TABLET BY MOUTH ONCE DAILY. [name], MD Taking Active   levothyroxine (SYNTHROID) 50 MCG tablet [id number] No TAKE 1 TABLET BEFORE BREAKFAST. [name], MD Taking Active   Multiple Vitamin (MULTIVITAMIN WITH MINERALS) TABS  tablet [id number] No Take 1 tablet by mouth daily. [provider] Taking Active Self  ondansetron (ZOFRAN) 4 MG tablet [id number] No TAKE 1 TABLET BY MOUTH 3 TIMES DAILY AS NEEDED FOR NAUSEA AND VOMITING. [name], MD Taking Active   osimertinib mesylate (TAGRISSO) 80 MG tablet [id number] No TAKE 1 TABLET (80 MG TOTAL) BY MOUTH DAILY. Orson Slick, MD Taking Active   OVER THE COUNTER MEDICATION [id number] No Bone Support 2-4 tabs daily [provider] Taking Active Self  OXcarbazepine (TRILEPTAL) 150 MG tablet [id number] No TAKE (1) TABLET BY MOUTH TWICE DAILY. [name], MD Taking Active   pantoprazole (PROTONIX) 40 MG tablet [id number] No TAKE (1) TABLET BY MOUTH ONCE DAILY BEFORE BREAKFAST. Carlis Stable, NP Taking Active   predniSONE (DELTASONE) 50 MG tablet [id number] No Please take one tablet at 13 hours, 7 hours and one hour prior to CT scan Orson Slick, MD Taking Active   Probiotic Product (ALIGN PO) [id number] No Take by mouth daily. [provider] Taking Active   PROLIA 60 MG/ML SOSY injection [id number] No INJECT INTO UPPER ARM, THIGH, OR ABDOMEN ONCE. [name], MD Taking Active   Med List Note Britt Boozer, [facility] 08/31/20 1015): Tagrisso filled through [employer] [phone] As of 03/13/2019             Patient Active Problem List   Diagnosis Date Noted   Abdominal bloating 01/13/2020   Primary cancer of left lower lobe of lung ([place]) 12/29/2019   Flatulence 11/17/2019   IBS (irritable bowel syndrome)    Acute hypoxemic respiratory failure ([place]) 05/25/2019   Pharyngeal dysphagia 05/07/2019   Brain metastasis ([place]) 04/17/2019   Brain tumor ([place]) 03/18/2019   Essential hypertension 03/12/2019   Brain metastases ([place]) 03/06/2019   Cytotoxic brain edema ([place]) 03/04/2019   ICH (intracerebral hemorrhage) ([place]) - L parietal lobe 03/03/2019   Nontraumatic intracerebral hemorrhage, unspecified ([place]) 03/03/2019    Osteoporosis 05/16/2018   S/P ORIF (open reduction internal fixation) fracture right patella 03/18/18 03/26/2018   Other specified postprocedural states 03/26/2018   Cellulitis, wound, post-operative 03/22/2018   Fracture of right patella 03/18/2018  Normocytic anemia    Esophageal dysphagia 11/27/2017   Heme positive stool 11/27/2017   Nasal crusting 02/06/2017   Iron deficiency anemia 12/05/2016   SIADH (syndrome of inappropriate ADH production) ([place])    Polypharmacy 09/14/2016   Muscle weakness (generalized)    Diastolic dysfunction 78/29/5621   Syncope 09/10/2016   Rib pain on left side    Hypersomnia 02/16/2016   Acute bronchitis 02/16/2016   Exertional dyspnea 12/20/2015   HLD (hyperlipidemia) 11/23/2014   Change in bowel habits 11/10/2014   Abdominal pain, left lower quadrant 11/10/2014   Emphysema lung ([place]) 30/86/5784   Helicobacter pylori gastritis 07/30/2014   Acute on chronic respiratory failure with hypoxia ([place]) 04/10/2014   Wrist fracture 04/09/2014   Occlusion and stenosis of carotid artery without mention of cerebral infarction 03/16/2014   T12 burst fracture ([place]) 01/13/2014   Constipation 12/14/2013   Acute respiratory failure with hypoxia ([place]) 12/14/2013   COPD (chronic obstructive pulmonary disease) ([place]) 12/14/2013   Compression fracture of thoracic vertebra ([place]) 12/14/2013   Hypoxia 12/13/2013   Compression fracture 12/13/2013   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[place]) 08/11/2013   AAA (abdominal aortic aneurysm) without rupture ([place]) 02/26/2012   Dyspepsia 02/11/2012   Aneurysm ([place]) 02/11/2012   Trigeminal neuralgia    Epigastric pain 02/21/2011   Dysphagia 02/21/2011   Adenocarcinoma of lung ([place]) 69/62/9528   HELICOBACTER PYLORI GASTRITIS, HX OF 12/30/2009   Radiographic dye allergy status 12/30/2009   Personal history of other diseases of the digestive system 12/30/2009    Immunization History  Administered Date(s) Administered   Fluad Quad(high Dose 65+)  06/19/2019, 07/20/2020   Hepatitis B 02/17/2007, 04/17/2007, 09/12/2007   Influenza, High Dose Seasonal PF 07/11/2018   Influenza,inj,Quad PF,6+ Mos 07/12/2015, 07/12/2016, 06/11/2017   Pneumococcal Conjugate-13 11/27/2019   Pneumococcal Polysaccharide-23 07/19/2014   Td 02/17/2007   Tdap 04/09/2014   Zoster, Live 01/12/2009    Conditions to be addressed/monitored:  HTN. Adenocarcinoma of lung w/ brain metastases, COPD, HLD.  Care Plan : General Pharmacy (Adult)  Updates made by Edythe Clarity, RPH since 03/20/2021 12:00 AM     Problem: HTN. Adenocarcinoma of lung w/ brain metastases, COPD, HLD   Priority: High  Onset Date: 11/15/2020     Long-Range Goal: Patient-Specific Goal   Start Date: 11/15/2020  Expected End Date: 05/15/2021  Recent Progress: On track  Priority: High  Note:   Current Barriers:  No true barriers identified at this visit   Pharmacist Clinical Goal(s):  Over the next 120 days, patient will work toward alleviating adverse effects from treatment through collaboration with PharmD and provider.   Interventions: 1:1 collaboration with [name], MD regarding development and update of comprehensive plan of care as evidenced by provider attestation and co-signature Inter-disciplinary care team collaboration (see longitudinal plan of care) Comprehensive medication review performed; medication list updated in electronic medical record  Hypertension (BP goal <140/90) -controlled -Current treatment: No medications at this time -Medications previously tried: none -Current home readings: none available  -Denies hypotensive/hypertensive symptoms -Educated on Importance of home blood pressure monitoring; Symptoms of hypotension and importance of maintaining adequate hydration; -Counseled to monitor BP at home periodically, document, and provide log at future appointments -Counseled on diet and exercise extensively  - Continue current management  strategy  Update 03/20/21 No changes to meds Denies any dizziness or HA'S  Hyperlipidemia: (LDL goal < 100) -uncontrolled -Current treatment: No medications -Medications previously tried: pravastatin, atorvastatin (unkown why d/c)  -Educated on Cholesterol goals;  -  Counseled on diet and exercise extensively recommend to continue current management strategy, would hesitate to recommend statin given her age and other comonbidities   COPD (Goal: control symptoms and prevent exacerbations) -controlled -Current treatment  Trelegy 100-62.5-25 mcg one puff daily Albuterol HFA 90 mcg -Medications previously tried: none noted   -Exacerbations requiring treatment in last 6 months:  none -Patient reports consistent use of maintenance inhaler -Frequency of rescue inhaler use: rarely -Counseled on Proper inhaler technique; Differences between maintenance and rescue inhalers -Recommended to continue current medication Assessed frequency of SOB episodes and inhaler usage  Update 03/20/21 Continues to use Trelegy as maintenance Rarely has to use Albuterol Reports breathing is controlled  Lung Cancer w/ brain metastases (Goal: Comfort care, minimize symptoms and adverse effects from treatment) -controlled -Current treatment  Tagrisso 51m  -Medications previously tried: none noted  -Counseled on use of O'Keefes working hands cream for patient having extreme dry hands and fingertips as a result of new treatment with Tagrisso  - Lesions on brain have cleared - Recommend continue current medication  Update 03/20/21 Continues to have dry hands and fingers, O'keefes has been somewhat effective States it is tolerable and she continues to be in good spirits  Patient Goals/Self-Care Activities Over the next 120 days, patient will:  - take medications as prescribed Try O'Keefes working hand cream and report any increasing or worsening adverse effects to providers  Follow Up Plan: The  care management team will reach out to the patient again over the next 120 days.          Compliance/Adherence/Medication fill history: Care Gaps: Covid-19 vaccine  Star-Rating Drugs: None  Medication Assistance: None required.  Patient affirms current coverage meets needs.   Patient's preferred pharmacy is:   [place] [address] [place]Phone: [phone]: [phone]  [place] [address]Phone: [phone]: [phone]  Uses pill box? Yes Pt endorses 100% compliance   We discussed: Benefits of medication synchronization, packaging and delivery as well as enhanced pharmacist oversight with [employer]. Patient decided to: Continue current medication management strategy - Has been with [facility] for years   Follow Up:  Patient agrees to Care Plan and Follow-up.   Plan: The patient has been provided with contact information for the care management team and has been advised to call with any health related questions or concerns.    CBeverly Milch PharmD Clinical Pharmacist [place]([phone]

## 2021-03-22 ENCOUNTER — Other Ambulatory Visit: Payer: Self-pay

## 2021-03-22 ENCOUNTER — Inpatient Hospital Stay: Payer: PPO

## 2021-03-22 ENCOUNTER — Inpatient Hospital Stay: Payer: PPO | Attending: Internal Medicine | Admitting: Hematology and Oncology

## 2021-03-22 ENCOUNTER — Other Ambulatory Visit: Payer: Self-pay | Admitting: Hematology and Oncology

## 2021-03-22 VITALS — BP 112/67 | HR 70 | Temp 98.4°F | Resp 17 | Wt 111.2 lb

## 2021-03-22 DIAGNOSIS — I720 Aneurysm of carotid artery: Secondary | ICD-10-CM | POA: Diagnosis not present

## 2021-03-22 DIAGNOSIS — C3492 Malignant neoplasm of unspecified part of left bronchus or lung: Secondary | ICD-10-CM

## 2021-03-22 DIAGNOSIS — Z87891 Personal history of nicotine dependence: Secondary | ICD-10-CM | POA: Insufficient documentation

## 2021-03-22 DIAGNOSIS — C3432 Malignant neoplasm of lower lobe, left bronchus or lung: Secondary | ICD-10-CM | POA: Diagnosis not present

## 2021-03-22 DIAGNOSIS — G44009 Cluster headache syndrome, unspecified, not intractable: Secondary | ICD-10-CM | POA: Diagnosis not present

## 2021-03-22 DIAGNOSIS — E222 Syndrome of inappropriate secretion of antidiuretic hormone: Secondary | ICD-10-CM | POA: Diagnosis not present

## 2021-03-22 DIAGNOSIS — C7931 Secondary malignant neoplasm of brain: Secondary | ICD-10-CM | POA: Insufficient documentation

## 2021-03-22 DIAGNOSIS — E039 Hypothyroidism, unspecified: Secondary | ICD-10-CM | POA: Diagnosis not present

## 2021-03-22 DIAGNOSIS — G500 Trigeminal neuralgia: Secondary | ICD-10-CM | POA: Diagnosis not present

## 2021-03-22 DIAGNOSIS — Z79899 Other long term (current) drug therapy: Secondary | ICD-10-CM | POA: Diagnosis not present

## 2021-03-22 DIAGNOSIS — J449 Chronic obstructive pulmonary disease, unspecified: Secondary | ICD-10-CM | POA: Insufficient documentation

## 2021-03-22 DIAGNOSIS — F419 Anxiety disorder, unspecified: Secondary | ICD-10-CM | POA: Insufficient documentation

## 2021-03-22 DIAGNOSIS — F32A Depression, unspecified: Secondary | ICD-10-CM | POA: Diagnosis not present

## 2021-03-22 LAB — CMP (CANCER CENTER ONLY)
ALT: 15 U/L (ref 0–44)
AST: 19 U/L (ref 15–41)
Albumin: 4.1 g/dL (ref 3.5–5.0)
Alkaline Phosphatase: 41 U/L (ref 38–126)
Anion gap: 7 (ref 5–15)
BUN: 20 mg/dL (ref 8–23)
CO2: 28 mmol/L (ref 22–32)
Calcium: 9.3 mg/dL (ref 8.9–10.3)
Chloride: 98 mmol/L (ref 98–111)
Creatinine: 0.64 mg/dL (ref 0.44–1.00)
GFR, Estimated: >60 mL/min (ref >60)
Glucose, Bld: 82 mg/dL (ref 70–99)
Potassium: 4.9 mmol/L (ref 3.5–5.1)
Sodium: 133 mmol/L — ABNORMAL LOW (ref 135–145)
Total Bilirubin: 0.4 mg/dL (ref 0.3–1.2)
Total Protein: 6.7 g/dL (ref 6.5–8.1)

## 2021-03-22 LAB — CBC WITH DIFFERENTIAL (CANCER CENTER ONLY)
Abs Immature Granulocytes: 0.01 10*3/uL (ref 0.00–0.07)
Basophils Absolute: 0 10*3/uL (ref 0.0–0.1)
Basophils Relative: 1 %
Eosinophils Absolute: 0.1 10*3/uL (ref 0.0–0.5)
Eosinophils Relative: 2 %
HCT: 33.3 % — ABNORMAL LOW (ref 36.0–46.0)
Hemoglobin: 10.9 g/dL — ABNORMAL LOW (ref 12.0–15.0)
Immature Granulocytes: 0 %
Lymphocytes Relative: 14 %
Lymphs Abs: 0.7 10*3/uL (ref 0.7–4.0)
MCH: 29.6 pg (ref 26.0–34.0)
MCHC: 32.7 g/dL (ref 30.0–36.0)
MCV: 90.5 fL (ref 80.0–100.0)
Monocytes Absolute: 0.5 10*3/uL (ref 0.1–1.0)
Monocytes Relative: 10 %
Neutro Abs: 3.6 10*3/uL (ref 1.7–7.7)
Neutrophils Relative %: 73 %
Platelet Count: 218 10*3/uL (ref 150–400)
RBC: 3.68 MIL/uL — ABNORMAL LOW (ref 3.87–5.11)
RDW: 13.1 % (ref 11.5–15.5)
WBC Count: 4.9 10*3/uL (ref 4.0–10.5)
nRBC: 0 % (ref 0.0–0.2)

## 2021-03-22 NOTE — Progress Notes (Signed)
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 0/14 lymph nodes.  No perineural invasion or LVI.  negative margins 2) 03/03/2019: presented to ED with expressive aphasia, found to have multiple brain metastasis and underwent SRS on 03/17/2019. 3) 03/18/2019: Stereotactic left parietal craniotomy for resection of tumor 4) 04/30/2020: Brain MRI on showed 2 new subcentimeter meta stasis in the right cerebellum and right frontal lobe. 5)  05/20/2020: Stereotactic radiation therapy was completed 6) 06/23/2020: establish care with Dr. [name]  7) 08/29/2020: prescribed osimirtinib therapy due to next metastatic disease in the brain.  8) 09/05/2020: started osimirtinib 37m PO daily 9) 10/19/2020: CT Chest showed no evidence of residual disease 10) 11/21/2020: desquamation and erythema of finger tips. Concern for HFS. Temporarily holding therapy  11) 12/05/2020: restarted Tagrisso after resolution of the HFS symptoms  Interval History:  [name] 80y.o. female with medical history significant for metastatic adenocarcinoma of the lung with brain metastasis who presents for a follow up visit. The patient's last visit was on 02/20/2021.  In the interim since the last visit the patient has continued on her Tagrisso with no major isuses.   On exam today [name] reports that she has been well in the interim since her last visit.  Her hands  look excellent and are currently at baseline with no redness, cracks, or pain.  She notes that her stools are occasionally dry and she does have some occasional constipation.  She reports recently that she has attempted to "cut out carbs".  She notes that she sometimes feels "ants crawling on my skin" when there are none.  She notes that her energy has been so-so and that she "needs to exercise more".  She otherwise has been quite well and is willing and able to proceed with osimertinib therapy.  She currently denies any fevers, chills, sweats, nausea vomiting or diarrhea.  A full ten point ROS is listed below.  MEDICAL HISTORY:  Past Medical History:  Diagnosis Date   Adenocarcinoma of lung ([facility] 12/2010   left lung/surg only   Allergic rhinitis    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[facility]    corrected by surgery 08/21/13   Anxiety disorder    Aortic aneurysm ([facility]    Brain metastasis ([facility]    lung cancer s/p resection 7/20   Cancer ([facility]    Phreesia 055/73/2202  Complication of anesthesia 2011   bloodpressure dropped during colonoscopy, not problems since   Compression fracture    COPD (chronic obstructive pulmonary disease) (HCC)    Depression    Diastolic dysfunction    Diverticulosis    Dysphagia    Emphysema lung ([facility] 11/08/2014   Headache    Hiatal hernia    History of kidney stones    Hypothyroidism    IBS (irritable bowel syndrome)    Iron deficiency anemia due to chronic blood loss 12/05/2016   On home O2 12/15/2013   chronic hypoxia   Pneumonia    PUD (peptic ulcer disease)    318yr  Shortness of breath    with exertion   SIADH (syndrome of inappropriate ADH production) (HCC)    Trigeminal neuralgia     SURGICAL HISTORY: Past Surgical History:  Procedure Laterality Date   ABDOMINAL HYSTERECTOMY     APPLICATION OF CRANIAL NAVIGATION N/A 03/18/2019   Procedure: APPLICATION OF CRANIAL NAVIGATION;  Surgeon: Consuella Lose, MD;  Location: [facility];  Service: Neurosurgery;   Laterality: N/A;   BACK SURGERY  January 13, 2014   BRAIN SURGERY N/A    Phreesia 03/28/2020   CATARACT EXTRACTION     CATARACT EXTRACTION W/PHACO  09/02/2012   Procedure: CATARACT EXTRACTION PHACO AND INTRAOCULAR LENS PLACEMENT (IOC);  Surgeon: Elta Guadeloupe T. 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 Oneida Alar: moderate diverticulosis, hemorrhoids, redundant colon. next TCS in 10-15 years.    CRANIOTOMY Left 03/18/2019   Procedure: Left stereotactic craniotomy for tumor resection;  Surgeon: Consuella Lose, MD;  Location: [place] [facility];  Service: Neurosurgery;  Laterality: Left;  Left stereotactic craniotomy for tumor resection   ENDARTERECTOMY Right 08/21/2013   Procedure: RIGHT CAROTID ANEURYSM RESECTION;  Surgeon: Rosetta Posner, MD;  Location: [facility];  Service: Vascular;  Laterality: Right;   ESOPHAGEAL DILATION  02/24/2018   Procedure: ESOPHAGEAL DILATION;  Surgeon: Danie Binder, MD;  [facility];  Service: Endoscopy;;   ESOPHAGOGASTRODUODENOSCOPY  11/13/2009   w/dilation to 79m, gastric ulceration (H.Pylori) s/p treatment   ESOPHAGOGASTRODUODENOSCOPY  11/21/2009   distal esophageal web, gastritis   ESOPHAGOGASTRODUODENOSCOPY  03/14/12   SSKA:JGOTLXBWIin the distal esophagus/Mild gastritis/small HH. + H.pylori, prescribed Pylera. Finished treatment.    ESOPHAGOGASTRODUODENOSCOPY N/A 12/13/2017   Dr. FOneida Alar esophageal web s/p dilation, gastritis, no h.pylori   ESOPHAGOGASTRODUODENOSCOPY N/A 02/24/2018   Dr. FOneida Alar web in prox esophagus and in distal esophagus s/p dilation, mild gastritis, mild pyloric stenosis, mild post ulcer duodenal deformity at D1/D2   fatty tumor removal from lt groin     FRACTURE SURGERY Left    hand and left ring finger   GIVENS CAPSULE STUDY N/A 12/26/2017   incomplete   GIVENS CAPSULE STUDY N/A  02/24/2018   normal small bowel   KNEE SURGERY     patella tendon repair june 2019 [facility]   LUNG CANCER SURGERY  12/2010   Left VATS, minithoracotomy, LLL superior segmentectomy   ORIF PATELLA Right 03/18/2018   Procedure: OPEN REDUCTION INTERNAL (ORIF) FIXATION RIGHT PATELLA;  Surgeon: HCarole Civil MD;  Location: [facility];  Service: Orthopedics;  Laterality: Right;   ORIF WRIST FRACTURE Left 04/09/2014   Procedure: OPEN REDUCTION INTERNAL FIXATION (ORIF) WRIST FRACTURE;  Surgeon: TRenette Butters MD;  [place] [facility]  Service: Orthopedics;  Laterality: Left;   PERCUTANEOUS PINNING Left 04/09/2014   Procedure: PERCUTANEOUS PINNING EXTREMITY;  Surgeon: TRenette Butters MD;  [place] [facility]  Service: Orthopedics;  Laterality: Left;   SAVORY DILATION N/A 12/13/2017   Procedure: SAVORY DILATION;  Surgeon: FDanie Binder MD;  [facility];  Service: Endoscopy;  Laterality: N/A;   TUBAL LIGATION     vocal cord surgery  02/06/2011   laryngoscopy with bilateral vocal cord Radiesse injection for vocal cord paralysis   YAG LASER APPLICATION Left 120/35/5974  Procedure: YAG LASER APPLICATION;  Surgeon: MRutherford Guys MD;  [facility];  Service:  Ophthalmology;  Laterality: Left;    SOCIAL HISTORY: Social History   Socioeconomic History   Marital status: Divorced    Spouse name: Not on file   Number of children: Not on file   Years of education: Not on file   Highest education level: Not on file  Occupational History   Not on file  Tobacco Use   Smoking status: Former    Packs/day: 0.50    Years: 20.00    Pack years: 10.00    Types: Cigarettes    Quit date: 12/21/2010    Years since quitting: 10.2   Smokeless tobacco: Never   Tobacco comments:    smoking cessation info given and reviewed   Vaping Use   Vaping Use: Never used  Substance and Sexual Activity   Alcohol use: Not Currently    Alcohol/week: 0.0 standard drinks   Drug use: No   Sexual activity: Yes     Birth control/protection: Surgical  Other Topics Concern   Not on file  Social History Narrative   Divorced since 7.Lives alone with a dog.Marland KitchenRetired.  She receives Meals on Wheels.   Social Determinants of Health   Financial Resource Strain: Not on file  Food Insecurity: Not on file  Transportation Needs: Not on file  Physical Activity: Not on file  Stress: Not on file  Social Connections: Not on file  Intimate Partner Violence: Not on file    FAMILY HISTORY: Family History  Problem Relation Age of Onset   Pulmonary embolism Mother    Heart attack Father    Hypertension Father    Liver cancer Sister    Cancer Sister    Cancer Brother    Cancer Daughter    Breast cancer Daughter    Colon cancer Neg Hx     ALLERGIES:  is allergic to ciprofloxacin hcl, sulfonamide derivatives, ciprofloxacin, iohexol, iodinated diagnostic agents, and prozac [fluoxetine hcl].  MEDICATIONS:  Current Outpatient Medications  Medication Sig Dispense Refill   albuterol (VENTOLIN HFA) 108 (90 Base) MCG/ACT inhaler INHALE 2 PUFFS EVERY 6 HOURS AS NEEDED FOR WHEEZING OR SHORTNESS OF BREATH. 8.5 g 0   ALPRAZolam (XANAX) 0.5 MG tablet TAKE (1) TABLET BY MOUTH TWICE A DAY AS NEEDED. 60 tablet 0   citalopram (CELEXA) 40 MG tablet TAKE (1/2) TABLET BY MOUTH AT BEDTIME. 45 tablet 0   diphenhydrAMINE (BENADRYL) 50 MG tablet Take 1 tablet (50 mg total) by mouth once for 1 dose. Take one hour prior to CT scan 1 tablet 0   ferrous sulfate 325 (65 FE) MG tablet Take 325 mg by mouth daily with breakfast.     fluticasone (FLONASE) 50 MCG/ACT nasal spray Place 2 sprays into both nostrils daily. 16 g 6   Fluticasone-Umeclidin-Vilant (TRELEGY ELLIPTA) 100-62.5-25 MCG/INH AEPB INHALE 1 PUFF INTO LUNGS DAILY. 60 each 6   gabapentin (NEURONTIN) 100 MG capsule TAKE 1 CAPSULE BY MOUTH THREE TIMES A DAY. 90 capsule 6   levocetirizine (XYZAL) 5 MG tablet TAKE ONE TABLET BY MOUTH ONCE DAILY. 30 tablet 6   levothyroxine  (SYNTHROID) 50 MCG tablet TAKE 1 TABLET BEFORE BREAKFAST. 90 tablet 0   Multiple Vitamin (MULTIVITAMIN WITH MINERALS) TABS tablet Take 1 tablet by mouth daily.     ondansetron (ZOFRAN) 4 MG tablet TAKE 1 TABLET BY MOUTH 3 TIMES DAILY AS NEEDED FOR NAUSEA AND VOMITING. 90 tablet 0   osimertinib mesylate (TAGRISSO) 80 MG tablet TAKE 1 TABLET (80 MG TOTAL) BY MOUTH DAILY. 30 tablet 2  OVER THE COUNTER MEDICATION Bone Support 2-4 tabs daily     OXcarbazepine (TRILEPTAL) 150 MG tablet TAKE (1) TABLET BY MOUTH TWICE DAILY. 60 tablet 6   pantoprazole (PROTONIX) 40 MG tablet TAKE (1) TABLET BY MOUTH ONCE DAILY BEFORE BREAKFAST. 90 tablet 1   predniSONE (DELTASONE) 50 MG tablet Please take one tablet at 13 hours, 7 hours and one hour prior to CT scan 3 tablet 0   Probiotic Product (ALIGN PO) Take by mouth daily.     PROLIA 60 MG/ML SOSY injection INJECT INTO UPPER ARM, THIGH, OR ABDOMEN ONCE. 1 mL 0   No current facility-administered medications for this visit.    REVIEW OF SYSTEMS:   Constitutional: ( - ) fevers, ( - )  chills , ( - ) night sweats Eyes: ( - ) blurriness of vision, ( - ) double vision, ( - ) watery eyes Ears, nose, mouth, throat, and face: ( - ) mucositis, ( - ) sore throat Respiratory: ( - ) cough, ( - ) dyspnea, ( - ) wheezes Cardiovascular: ( - ) palpitation, ( - ) chest discomfort, ( - ) lower extremity swelling Gastrointestinal:  ( - ) nausea, ( - ) heartburn, ( - ) change in bowel habits Skin: ( - ) abnormal skin rashes Lymphatics: ( - ) new lymphadenopathy, ( - ) easy bruising Neurological: ( - ) numbness, ( - ) tingling, ( - ) new weaknesses Behavioral/Psych: ( - ) mood change, ( - ) new changes  All other systems were reviewed with the patient and are negative.  PHYSICAL EXAMINATION: Telephone Visit  LABORATORY DATA:  I have reviewed the data as listed CBC Latest Ref Rng & Units 03/22/2021 02/20/2021 01/19/2021  WBC 4.0 - 10.5 K/uL 4.9 4.9 4.8  Hemoglobin 12.0 - 15.0  g/dL 10.9(L) 11.8(L) 11.0(L)  Hematocrit 36.0 - 46.0 % 33.3(L) 35.8(L) 33.7(L)  Platelets 150 - 400 K/uL 218 222 207    CMP Latest Ref Rng & Units 03/22/2021 02/20/2021 01/19/2021  Glucose 70 - 99 mg/dL 82 88 87  BUN 8 - 23 mg/dL _0 Creatinine 0.44 - 1.00 mg/dL 0.64 0.77 0.63  Sodium 135 - 145 mmol/L 133(L) 131(L) 133(L)  Potassium 3.5 - 5.1 mmol/L 4.9 4.7 4.8  Chloride 98 - 111 mmol/L 98 95(L) 96(L)  CO2 22 - 32 mmol/L _1 Calcium 8.9 - 10.3 mg/dL 9.3 9.5 9.2  Total Protein 6.5 - 8.1 g/dL 6.7 6.6 6.6  Total Bilirubin 0.3 - 1.2 mg/dL 0.4 0.3 0.3  Alkaline Phos 38 - 126 U/L 41 38 46  AST 15 - 41 U/L _2 ALT 0 - 44 U/L _3 RADIOGRAPHIC STUDIES:  MRI Brain 08/05/2020:   CLINICAL DATA:  Trigeminal neuralgia. Headache, cluster/trigeminal. Brain/CNS neoplasm, assess treatment response. Metastatic lung cancer. Additional history provided: SRS to 5 brain metastases 03/17/2019, left parietal craniotomy for tumor resection 03/18/2019, SRS to 2 additional metastases 01/01/2020.   EXAM: MRI HEAD WITHOUT AND WITH CONTRAST   MRA HEAD WITHOUT CONTRAST   TECHNIQUE: Multiplanar, multiecho pulse sequences of the brain and surrounding structures were obtained without and with intravenous contrast. Angiographic images of the head were obtained using MRA technique without contrast.   CONTRAST:  63m MULTIHANCE GADOBENATE DIMEGLUMINE 529 MG/ML IV SOLN   COMPARISON:  Prior brain MRI examinations 04/30/2020 and earlier. MRA head 03/04/2019.   FINDINGS: MRI HEAD FINDINGS   Brain:   Stable generalized cerebral atrophy.  Again demonstrated is a hemosiderin stained left parietooccipital resection cavity. Irregular and gyriform enhancement has increased from the prior examination, now measuring up to 12 mm in thickness (for instance as seen on series 14, image 90) (series 15, image 9). This irregular enhancement extends to the site of a previously demonstrated 3  mm nodular focus of enhancement within the superolateral aspect of the cavity. Surrounding T2/FLAIR hyperintense signal abnormality has not significantly changed.   A previously demonstrated 4 mm lesion within the medial right cerebellum is no longer appreciated.   A 4 mm enhancing lesion within the left cerebellar hemisphere was present on the prior examination but seen to better advantage on today's study (series 14, image 48).   New 2 mm enhancing lesion more posteriorly within the medial left cerebellar hemisphere (series 14, image 50).   New 2 mm enhancing lesion within the lateral left cerebellum (series 14, image 42).   A 3 mm lesion within the high posterior right frontal lobe has decreased in size (series 14, image 145) (previously 5 mm).   A previously demonstrated 2 mm enhancing lesion within the left frontal lobe has increased in size, now measuring 8 mm (series 14, image 125) (series 11, image 39) . The lesion now demonstrates central T2/FLAIR hyperintensity and T1 hyperintensity with a peripheral low signal rim. Precontrast T1 hyperintensity limits evaluation for enhancement at this site.   Multifocal T2/FLAIR hyperintensity elsewhere within the cerebral white matter and within the pons is nonspecific, but compatible chronic small vessel ischemic disease.   No evidence of acute infarction.   No extra-axial fluid collection.   No midline shift.   Vascular: Expected proximal arterial flow voids.   Skull and upper cervical spine: No focal suspicious marrow lesion.   Sinuses/Orbits: Visualized orbits show no acute finding. Trace ethmoid sinus mucosal thickening. No significant mastoid effusion.   MRA HEAD FINDINGS   The intracranial internal carotid arteries are patent. The M1 middle cerebral arteries are patent without significant stenosis. No M2 proximal branch occlusion or high-grade proximal stenosis is identified. The anterior cerebral arteries are  patent. 1-2 mm inferiorly projecting vascular protrusion arising from the supraclinoid right ICA which may reflect an infundibulum or small aneurysm (series 103, image 12).   The intracranial vertebral arteries are patent. The basilar artery is patent. The posterior cerebral arteries are patent.   IMPRESSION: MRI brain:   1. Irregular and gyriform enhancement at site of the left parietooccipital resection cavity has increased, now measuring up to 12 mm in thickness. This enhancement extends to the site of a previously demonstrated 3 mm nodular enhancing focus within the superolateral aspect of the cavity. Findings may reflect post-treatment inflammation or recurrent tumor. Short interval MRI follow-up is recommended. Surrounding T2 hyperintense signal abnormality has not appreciably changed. 2. New 2 mm metastasis within the posteromedial left cerebellar hemisphere. 3. New 2 mm metastasis within the lateral left cerebellum. 4. An additional 4 mm metastasis within the left cerebellum was present on the prior MRI of 04/30/2020, but is seen to better advantage on today's study. 5. A previously demonstrated 4 mm metastasis within the medial right cerebellum is no longer appreciated 6. 8 mm metastasis within the paramedian left frontal lobe, increased in size and now demonstrating precontrast T1 hyperintensity which limits evaluation for enhancement. 7. A 3 mm lesion within the high posterior right frontal lobe has decreased in size.   MRA head:   1. No intracranial large vessel occlusion or proximal high-grade arterial stenosis. 2.  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     Electronically Signed   By: Kellie Simmering DO   On: 08/05/2020 13:49  No results found.   ASSESSMENT & PLAN [name] 80 y.o. female with medical history significant for metastatic adenocarcinoma of the lung with brain metastasis who presents for a follow  up visit.  After review the records, review of the labs, review the imaging, discussion with the patient the findings are most consistent with a stage IV adenocarcinoma of the lung with a small lung lesion and metastatic spread to the brain.  Her metastatic brain mets have been treated with resection of tumor on 03/18/2019 as well as SRS on 12/12/2019.  Most recently she was found to have 2 new subcentimeter lesions and underwent stereotactic radiation therapy which was completed on 05/20/2020. Unfortunately on 08/05/2020 she was noted to have new CNS metastatic disease.   There are limitations in what therapies can be used for this patient given her poor functional status.  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  After extensive discussions with the patient's about the risks and benefits of this treatment she was agreeable to starting osimertinib 80 mg p.o. daily for her metastatic adenocarcinoma of the lung. She started this treatment on 09/05/2020.  On discussion today [name] is tolerating Tagrisso well and is willing and able to proceed at this time.  Restaging CT scans are planned for August 2022.   #Stage IV ([id number]) adenocarcinoma of the lung: -Left lower lobe resection on 12/21/2010, 14 lymph nodes negative, 2.2 cm tumor size, margins negative. -most recent MRI brain scan shows stable disease. No evidence of Chest/abdomen involvement on scan from 02/14/2021.  --started therapy with osimertinib 80 mg PO daily for her EGFR Exon 19 mutation. She started on 09/05/2020 -CT chest with contrast on 02/14/2021 showed no active disease in the chest. Continue CT scans q 3 months.  -PD-L1 testing was 1%. -- Patient is a poor candidate for systemic therapy, though she does have an EGFR Exon 19 mutation.  --RTC in 4 weeks time to assure continued tolerance of osimirtenib.   #Hand Foot Syndrome, improved --previously noted to have desquamation and  erythema of finger tips. Resolved on exam today.  --held therapy for 1-2 weeks until resolution --patient had resolution of symptoms after 2 weeks off therapy. Encouraged continue aggressive moisturizing lotion.    #Brain metastasis, stable -Presentation with expressive aphasia to ER on 03/03/2019 along with right-sided weakness.  Found to have multiple brain metastasis and underwent SRS on 03/17/2019. 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 -Underwent SRS on 01/01/2020 for 2 subcentimeter brain lesions. -Brain MRI on 04/30/2020 showed 2 new subcentimeter meta stasis in the right cerebellum and right frontal lobe. -Stereotactic radiation therapy was completed on 05/20/2020 --most recent MRI on 02/03/2021 showed stable disease. Continue systemic therapy as above.   No orders of the defined types were placed in this encounter.   All questions were answered. The patient knows to call the clinic with any problems, questions or concerns.  A total of more than 30 minutes were spent on this encounter and over half of that time was spent on counseling and coordination of care as outlined above.   Ledell Peoples, MD Department of Hematology/Oncology [place] [facility] Phone: [phone] Pager: [phone]  Email: [email or web address]  03/23/2021 9:34 AM    Suszanne Conners HA, Su JM, Olancha SH, Rowe Pavy, [place] MJ. 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 J Thorac Oncol. 2020 Nov;15(11):1758-1766. doi: 10.1016/j.jtho.2020.06.018. Epub 2020 Jul 9. PMID: [id number].  --Osimertinib is a promising treatment option for EGFR-mutated NSCLC with LM regardless of T790M mutational status.

## 2021-03-23 ENCOUNTER — Encounter (HOSPITAL_COMMUNITY): Payer: Self-pay | Admitting: Internal Medicine

## 2021-03-27 IMAGING — DX CHEST - 2 VIEW
2 series · 2 of 2 positions shown · non-contrast
Comparison: Single-view of the chest 04/19/2019. PA and lateral
chest 04/18/2017. CT chest 04/19/2019.

CLINICAL DATA: Right chest pain with inhalation. The patient has
been treated for pneumonia over the past month.

EXAM:
CHEST - 2 VIEW

[chest lat]
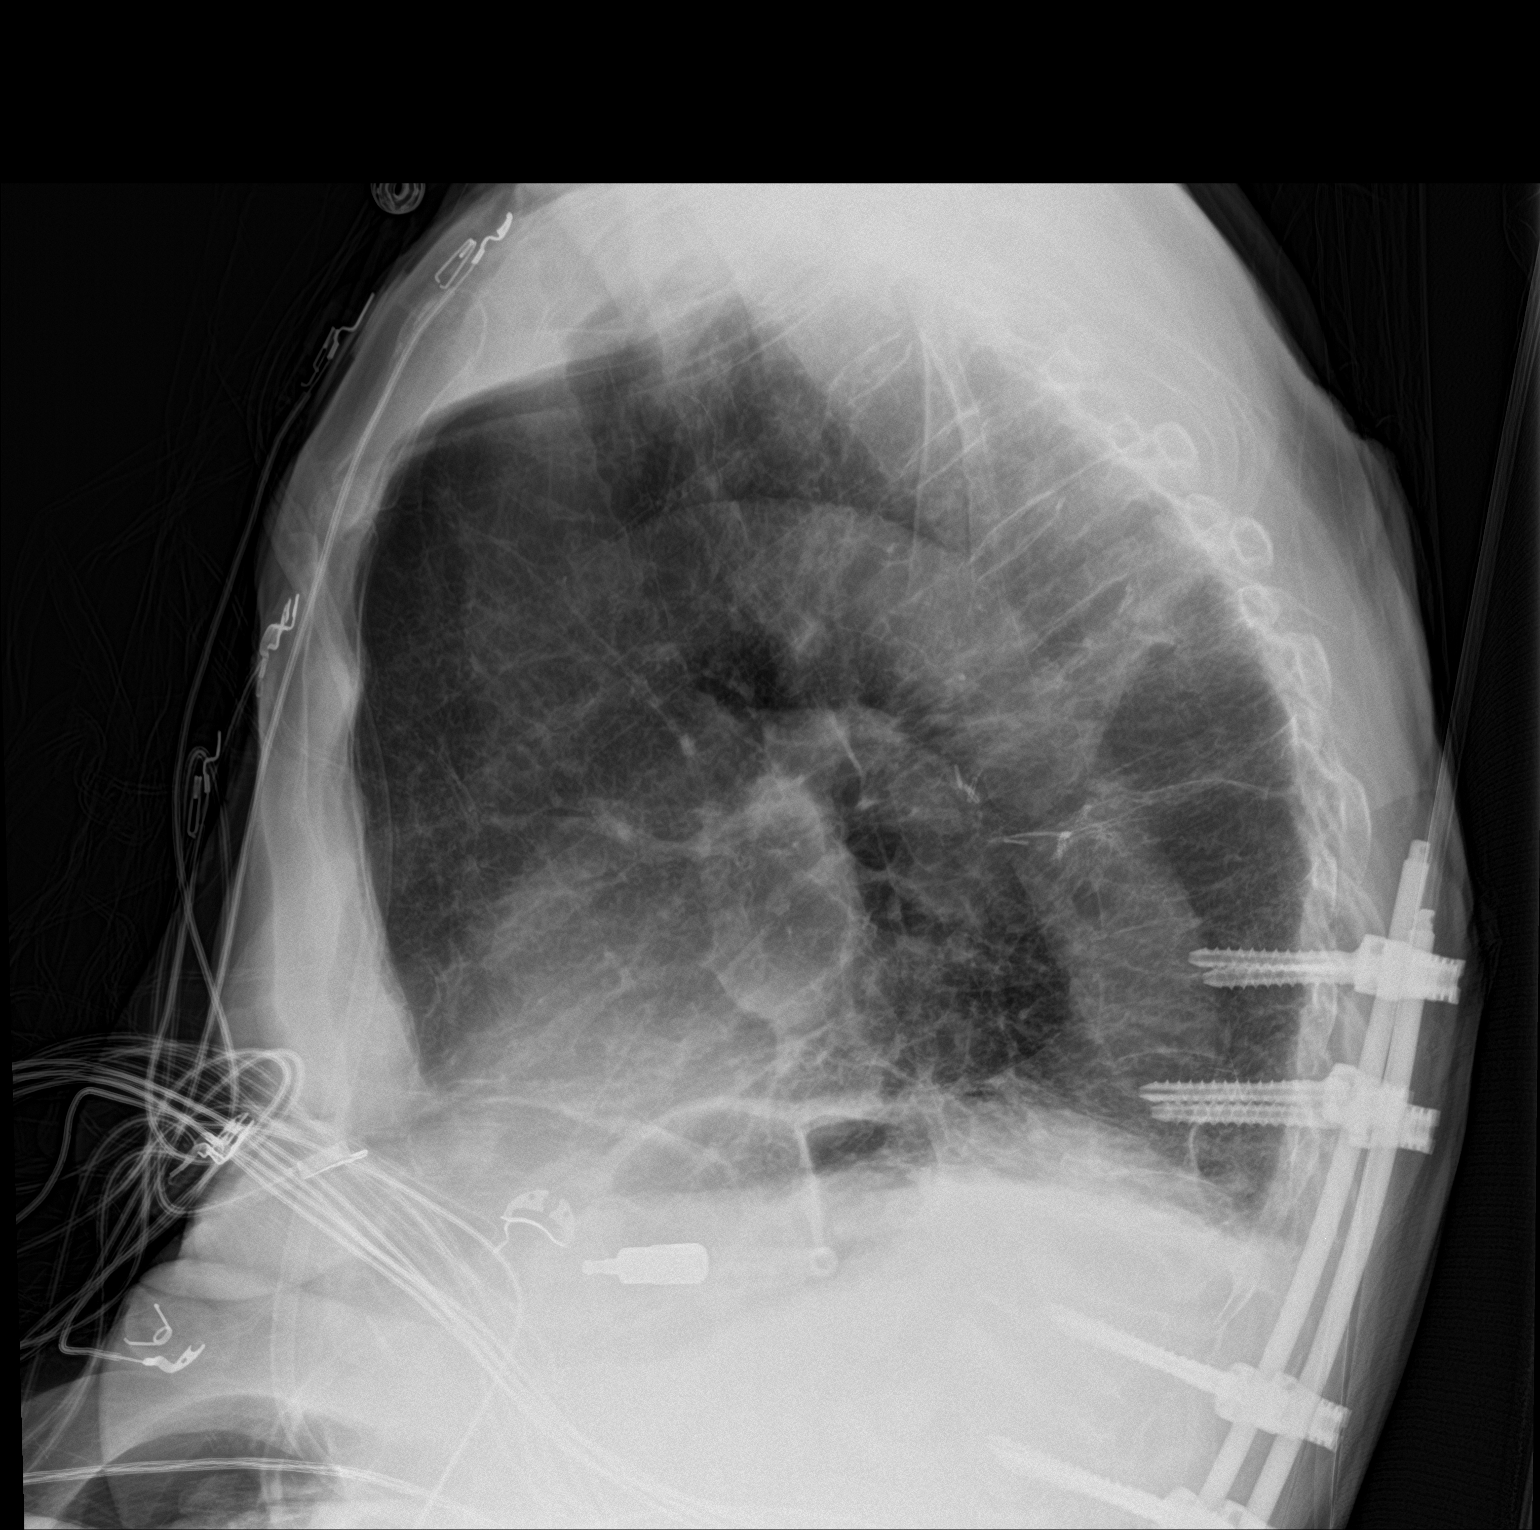

[chest ap]
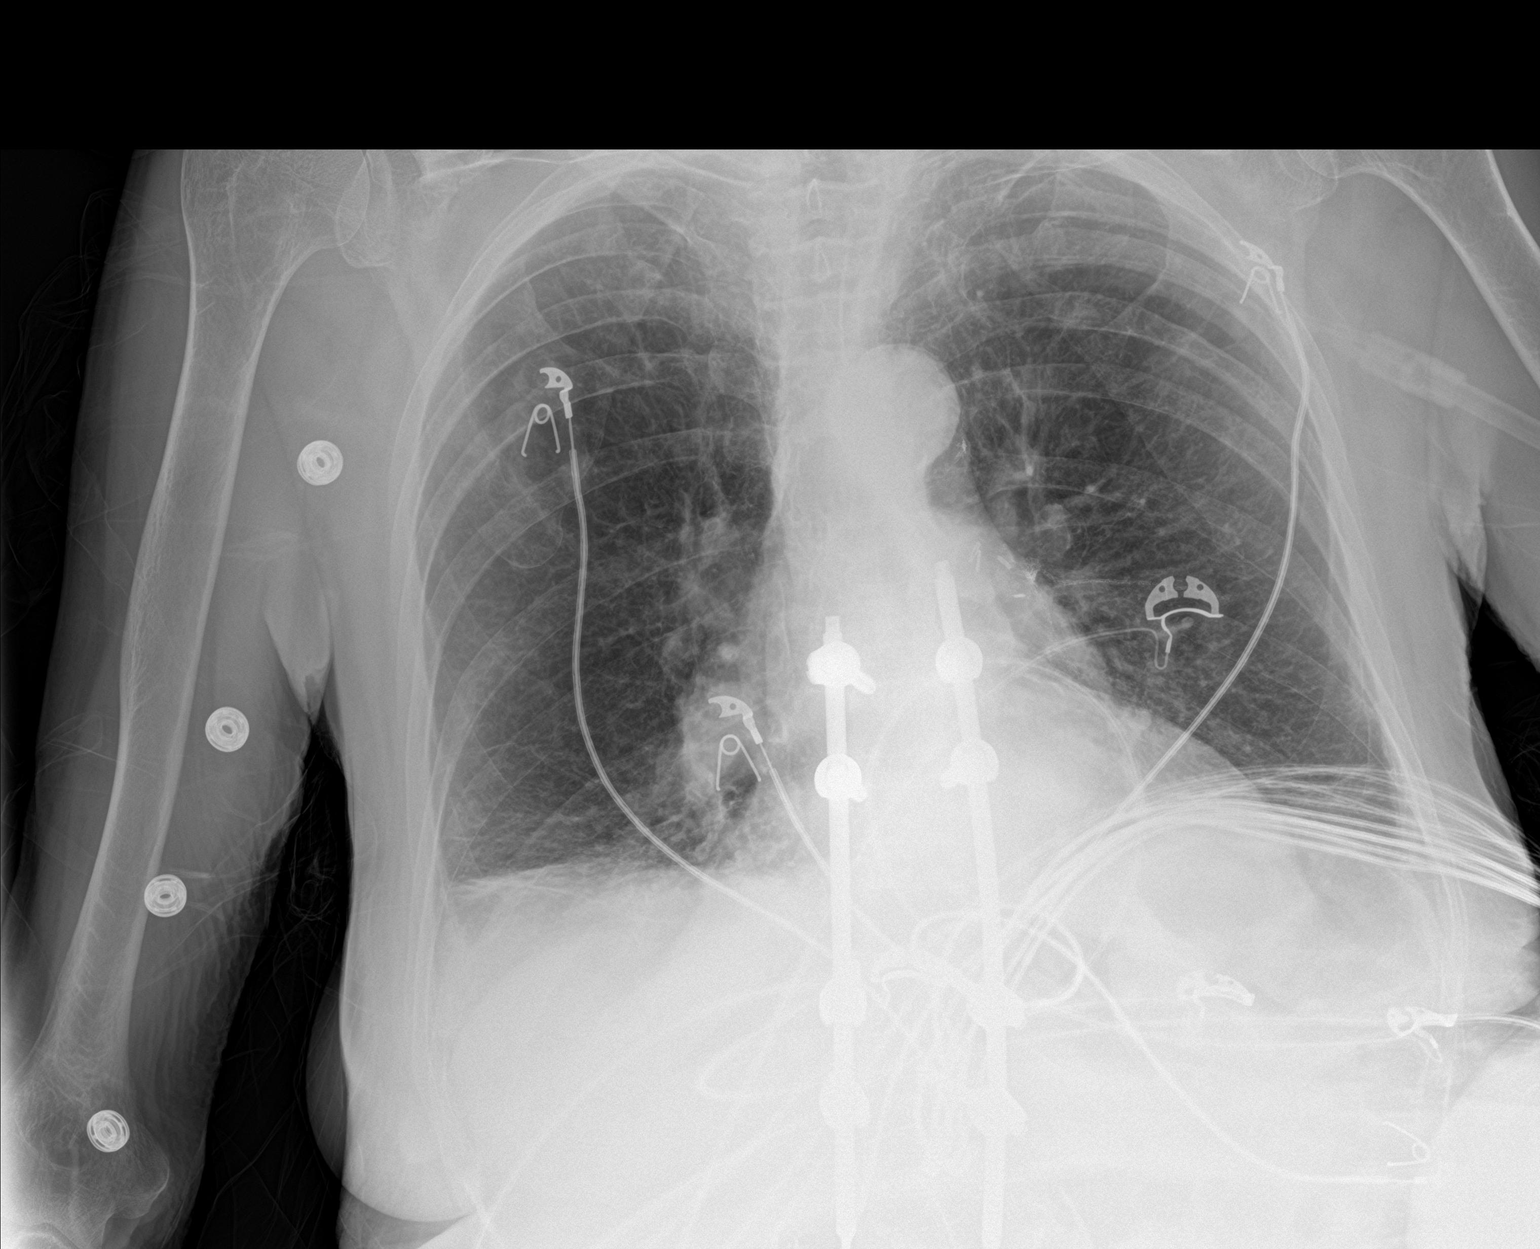

[2 of 2 positions shown; findings below may reference images not displayed]

FINDINGS: The lungs are emphysematous. Right basilar airspace opacity is
identified and slightly increased compared to the most recent plain
films. Left lung is clear. Heart size is upper normal. Aortic
atherosclerosis is noted. No pneumothorax or pleural fluid. No bony
abnormality.
IMPRESSION: Hazy right basilar airspace opacity appears slightly worse on the
prior examination and could be due to atelectasis or pneumonia.

Emphysema.

## 2021-03-28 ENCOUNTER — Encounter: Payer: Self-pay | Admitting: Hematology and Oncology

## 2021-03-28 ENCOUNTER — Telehealth: Payer: Self-pay | Admitting: *Deleted

## 2021-03-28 IMAGING — CT CT ANGIOGRAPHY CHEST
2 of 6 series · 19 of 46 positions shown · IV contrast (Isovue)
Comparison: CT scan of April 19, 2019.

CLINICAL DATA: Shortness of breath.  Productive cough.

EXAM:
CT ANGIOGRAPHY CHEST WITH CONTRAST
TECHNIQUE: Multidetector CT imaging of the chest was performed using the
standard protocol during bolus administration of intravenous
contrast. Multiplanar CT image reconstructions and MIPs were
obtained to evaluate the vascular anatomy.
CONTRAST:  100mL OMNIPAQUE IOHEXOL 350 MG/ML SOLN

[Series 8: pe axial thins · axial · 0.65mm/px · z∈[+1154,+1391]mm · 16 of 261 slices shown]
[im 12/261  lung]
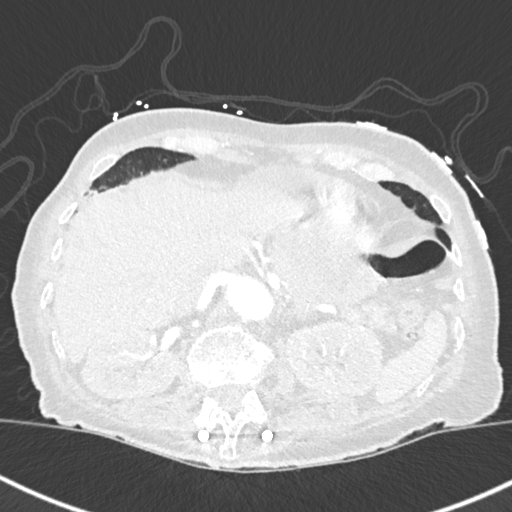
[im 34/261  soft-tissue]
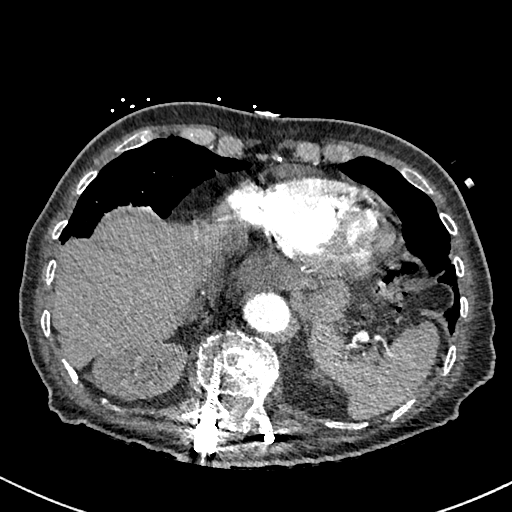
[im 46/261  lung]
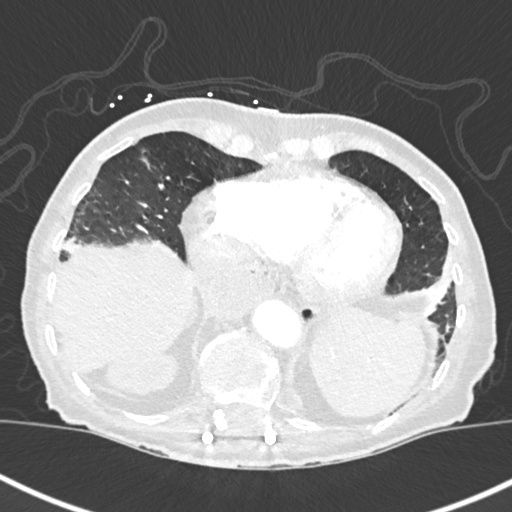
[im 57/261  soft-tissue]
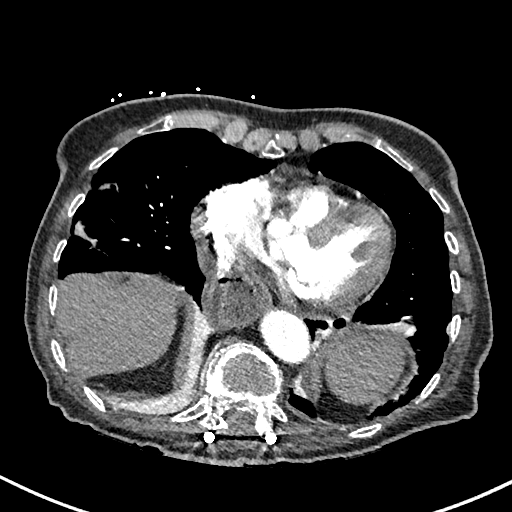
[im 80/261  lung]
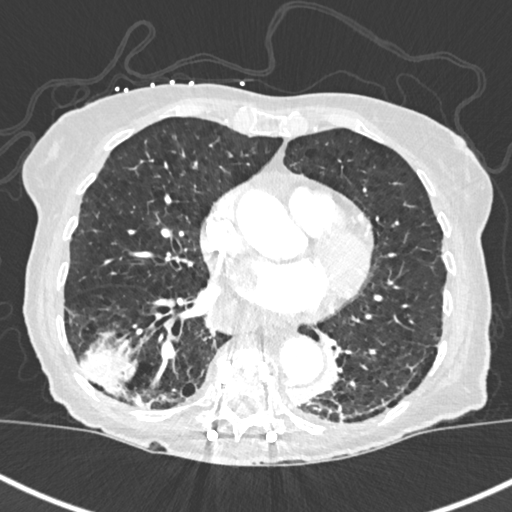
[im 91/261  soft-tissue]
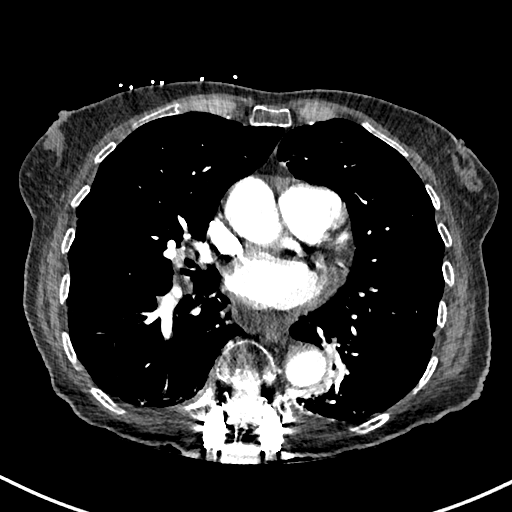
[im 102/261  lung]
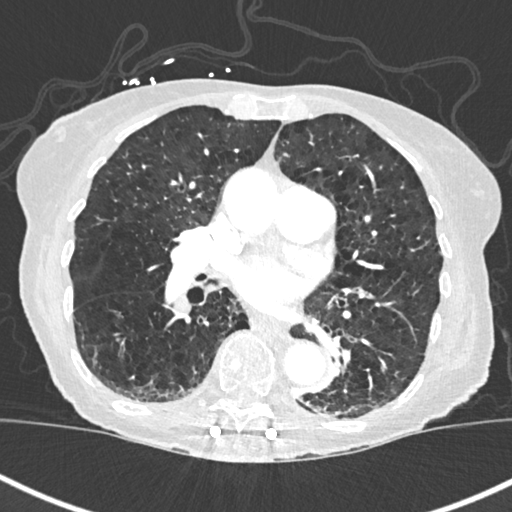
[im 125/261  soft-tissue]
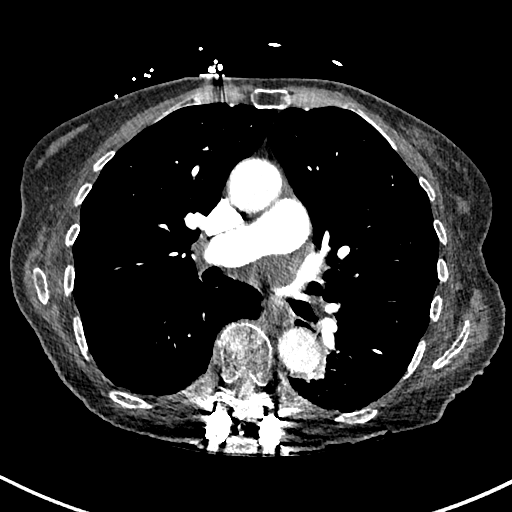
[im 136/261  lung]
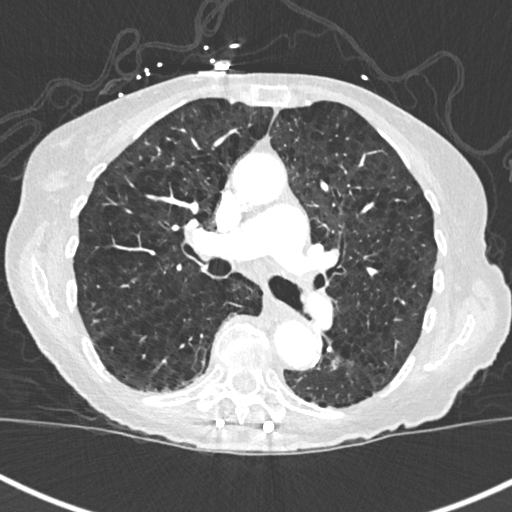
[im 159/261  soft-tissue]
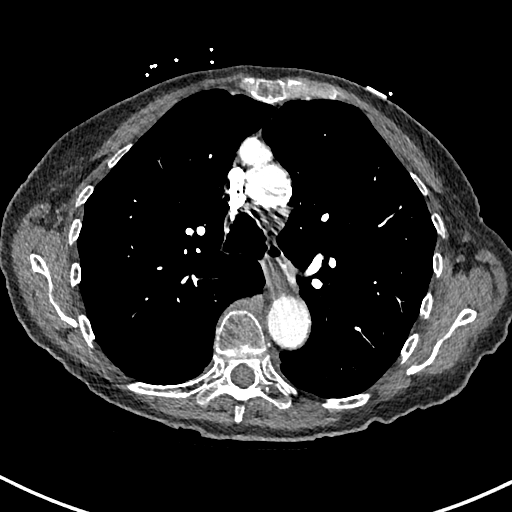
[im 170/261  lung]
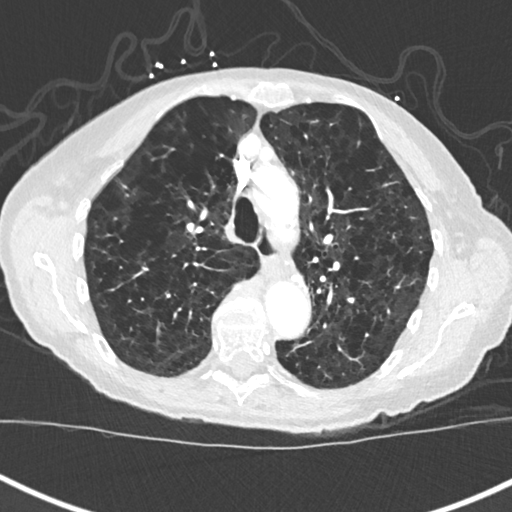
[im 181/261  soft-tissue]
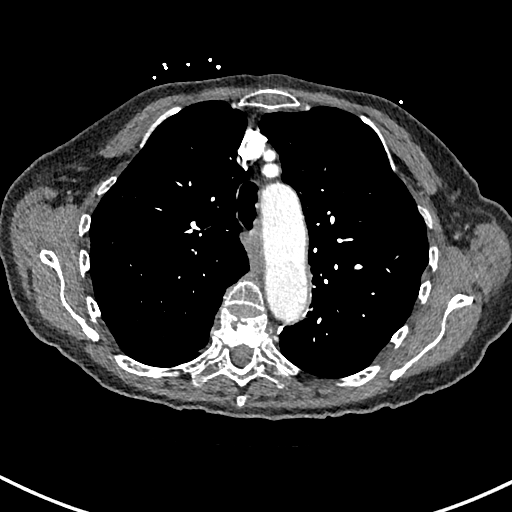
[im 204/261  lung]
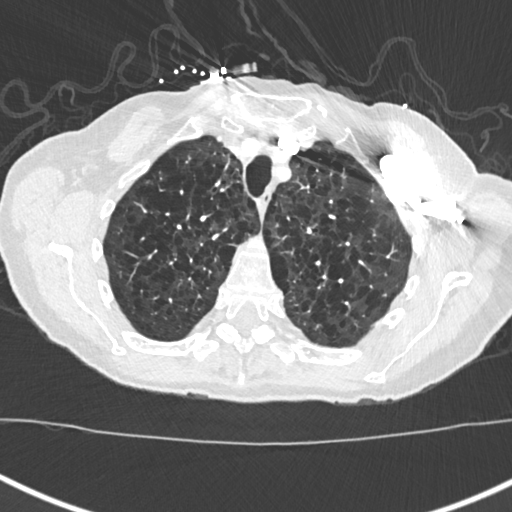
[im 215/261  soft-tissue]
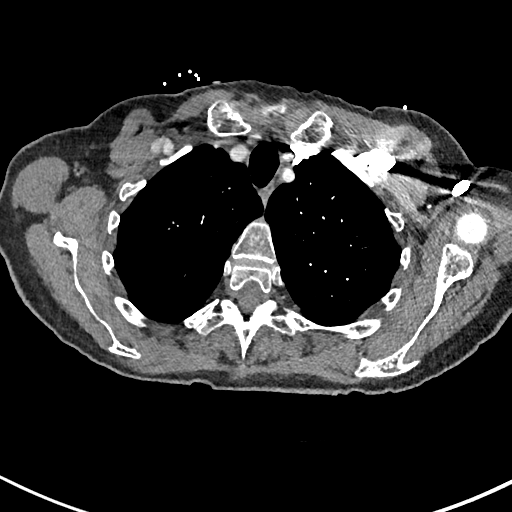
[im 227/261  lung]
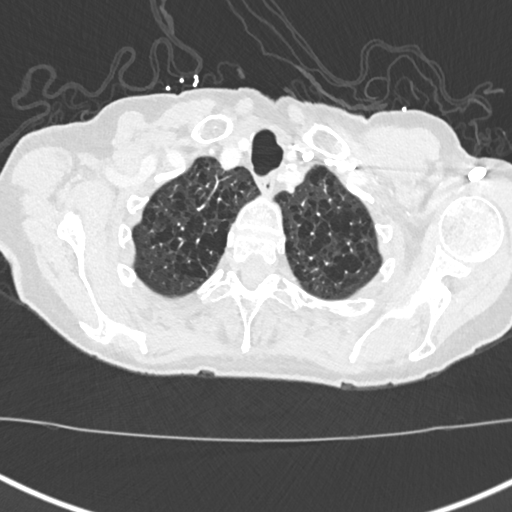
[im 249/261  soft-tissue]
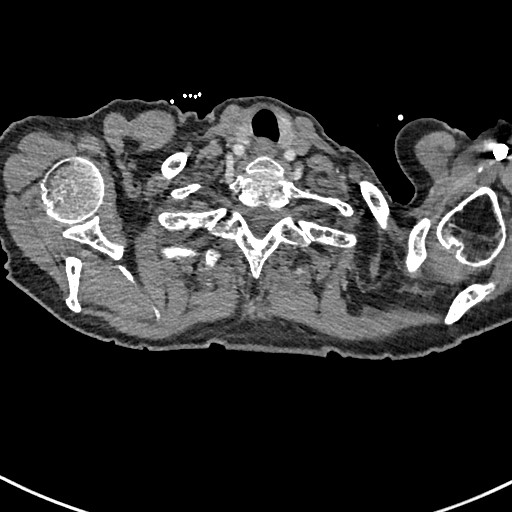

[Series 10: cor soft · coronal · 0.54mm/px · 3 of 143 slices shown]
[im 36/143  soft-tissue]
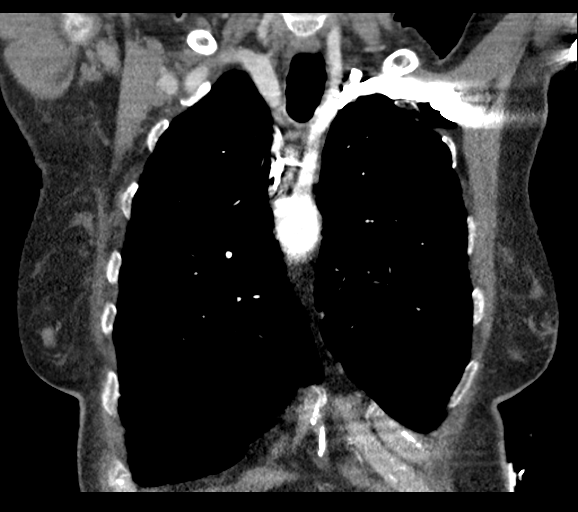
[im 72/143  soft-tissue]
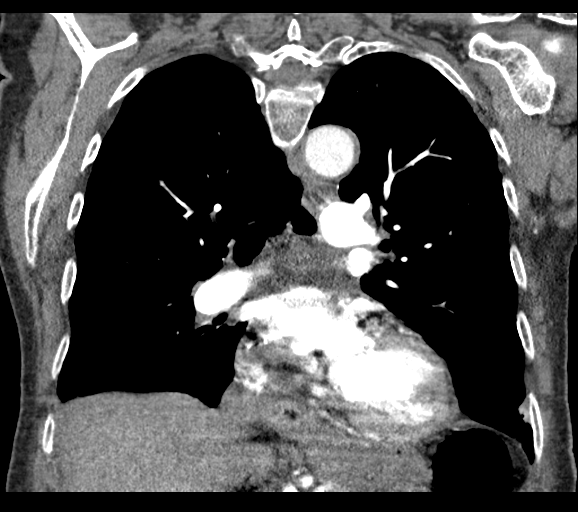
[im 107/143  soft-tissue]
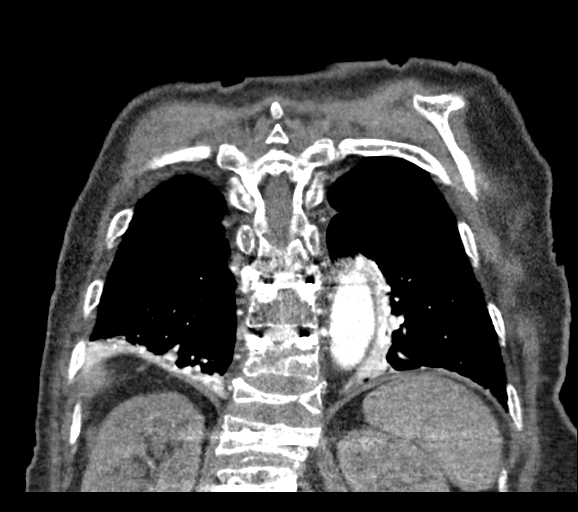

[19 of 46 positions shown; findings below may reference images not displayed]

FINDINGS: Cardiovascular: Satisfactory opacification of the pulmonary arteries
to the segmental level. No evidence of pulmonary embolism. Normal
heart size. No pericardial effusion. Atherosclerosis of thoracic
aorta is noted without aneurysm or dissection.

Mediastinum/Nodes: Moderate size sliding-type hiatal hernia is
noted. Thyroid gland is unremarkable. No adenopathy is noted.

Lungs/Pleura: No pneumothorax or pleural effusion is noted.
Emphysematous disease is noted in both lungs. Mild bilateral
posterior basilar subsegmental atelectasis is noted.

Upper Abdomen: No acute abnormality.

Musculoskeletal: Status post surgical posterior fusion of the
thoracic spine. Stable multiple old compression fractures are noted.
No acute osseous abnormality is noted.

Review of the MIP images confirms the above findings.
IMPRESSION: No definite evidence of pulmonary embolus.

Moderate size sliding-type hiatal hernia is noted.

Aortic Atherosclerosis (AB906-YW0.0) and Emphysema (AB906-D52.Z).

## 2021-03-28 NOTE — Telephone Encounter (Signed)
Patient called to report worsening fatigue/dizziness and feels almost faint.  She reports also having nausea without any vomiting.  Doesn't feel up to sitting up for prolonged periods.  She is on Tagrisso since November and states that this has just started to get worse here in the past couple of weeks. Reports hydrating approximately 50 oz of fluid a day at max.   Denies having any decreased oxygen levels while using her oxygen.  She has a history of GI issues and wasn't sure if this could be a side effect of the medication or if she should follow up with PCP to address them.   States she used an antiemetic but it hasn't helped.    Routed to Dr [name] to advise.

## 2021-03-31 ENCOUNTER — Other Ambulatory Visit: Payer: Self-pay | Admitting: Family Medicine

## 2021-04-01 DIAGNOSIS — C3490 Malignant neoplasm of unspecified part of unspecified bronchus or lung: Secondary | ICD-10-CM | POA: Diagnosis not present

## 2021-04-01 DIAGNOSIS — J9621 Acute and chronic respiratory failure with hypoxia: Secondary | ICD-10-CM | POA: Diagnosis not present

## 2021-04-01 DIAGNOSIS — J449 Chronic obstructive pulmonary disease, unspecified: Secondary | ICD-10-CM | POA: Diagnosis not present

## 2021-04-01 DIAGNOSIS — R0902 Hypoxemia: Secondary | ICD-10-CM | POA: Diagnosis not present

## 2021-04-03 ENCOUNTER — Other Ambulatory Visit: Payer: Self-pay | Admitting: Family Medicine

## 2021-04-03 DIAGNOSIS — Z515 Encounter for palliative care: Secondary | ICD-10-CM | POA: Diagnosis not present

## 2021-04-03 DIAGNOSIS — I5032 Chronic diastolic (congestive) heart failure: Secondary | ICD-10-CM | POA: Diagnosis not present

## 2021-04-03 DIAGNOSIS — Z681 Body mass index (BMI) 19 or less, adult: Secondary | ICD-10-CM | POA: Diagnosis not present

## 2021-04-03 DIAGNOSIS — J449 Chronic obstructive pulmonary disease, unspecified: Secondary | ICD-10-CM | POA: Diagnosis not present

## 2021-04-03 DIAGNOSIS — J9611 Chronic respiratory failure with hypoxia: Secondary | ICD-10-CM | POA: Diagnosis not present

## 2021-04-03 DIAGNOSIS — E039 Hypothyroidism, unspecified: Secondary | ICD-10-CM | POA: Diagnosis not present

## 2021-04-03 DIAGNOSIS — Z9981 Dependence on supplemental oxygen: Secondary | ICD-10-CM | POA: Diagnosis not present

## 2021-04-03 NOTE — Telephone Encounter (Signed)
Ok to refill??  Last office visit 07/04/2020.  Last refill 01/31/2021.

## 2021-04-04 NOTE — Telephone Encounter (Signed)
PDMP reviewed.  Appears patient takes this medication chronically.  Refill given in place of PCP who is out of the office. 

## 2021-04-06 ENCOUNTER — Other Ambulatory Visit (HOSPITAL_COMMUNITY): Payer: Self-pay

## 2021-04-06 IMAGING — CT CT ABDOMEN AND PELVIS WITH CONTRAST
2 of 5 series · 15 of 46 positions shown, 17 images · IV contrast (omnipaque)
Comparison: CT dated 03/06/2019

CLINICAL DATA: 77-year-old female with abdominal pain. No bowel
movement over the.

EXAM:
CT ABDOMEN AND PELVIS WITH CONTRAST
TECHNIQUE: Multidetector CT imaging of the abdomen and pelvis was performed
using the standard protocol following bolus administration of
intravenous contrast.
CONTRAST:  75mL OMNIPAQUE IOHEXOL 300 MG/ML  SOLN

[Series 2: axial st · axial · 0.65mm/px · z∈[+793,+1123]mm · 12 of 80 slices shown, 14 images]
[im 7/80  soft-tissue]
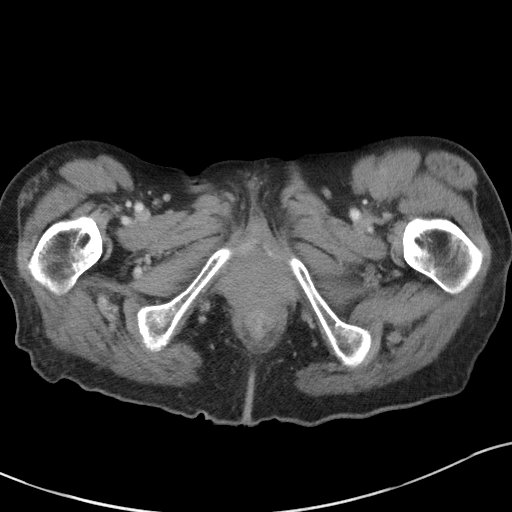
[im 7/80  bone]
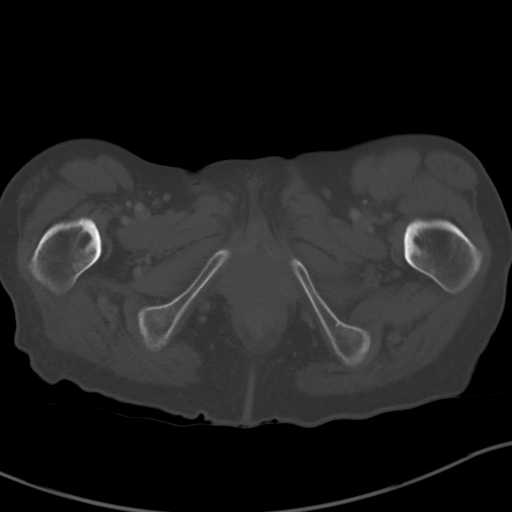
[im 13/80  soft-tissue]
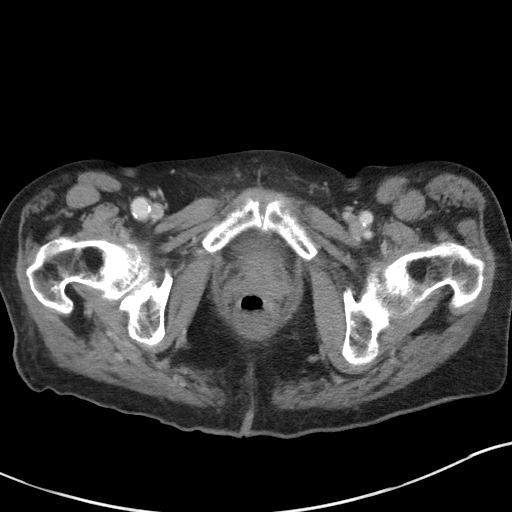
[im 19/80  soft-tissue]
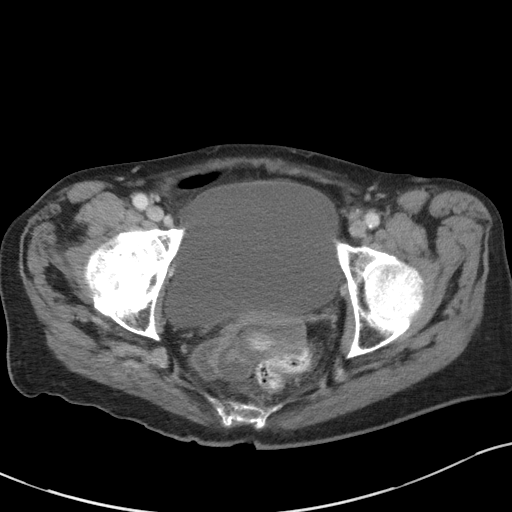
[im 25/80  soft-tissue]
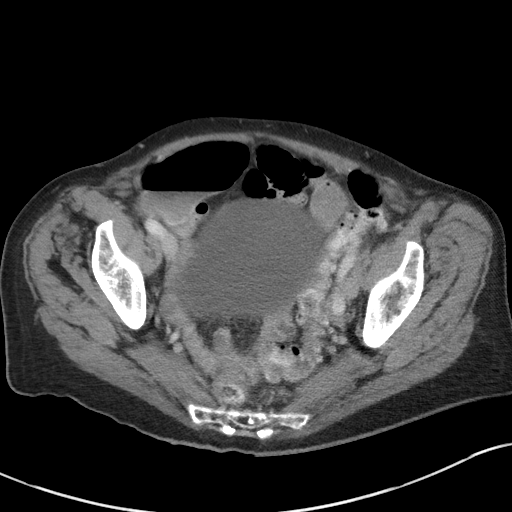
[im 31/80  soft-tissue]
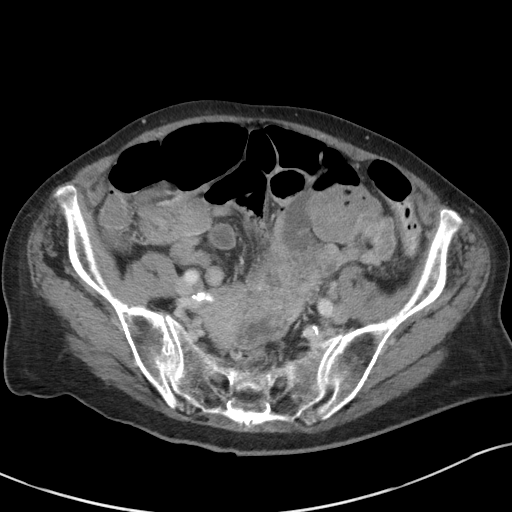
[im 37/80  soft-tissue]
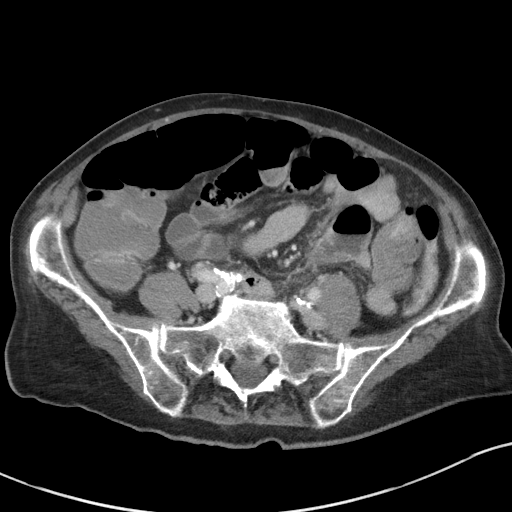
[im 43/80  soft-tissue]
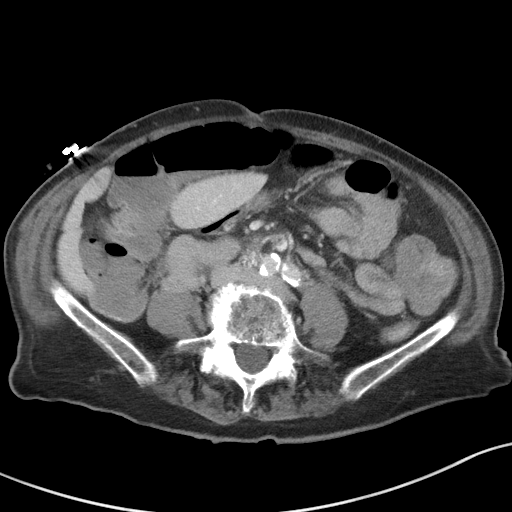
[im 49/80  soft-tissue]
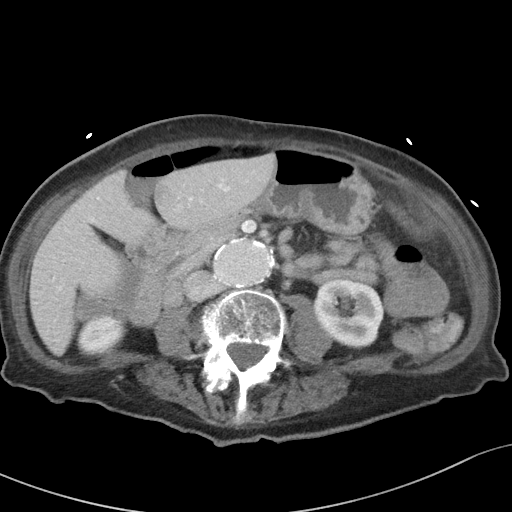
[im 55/80  soft-tissue]
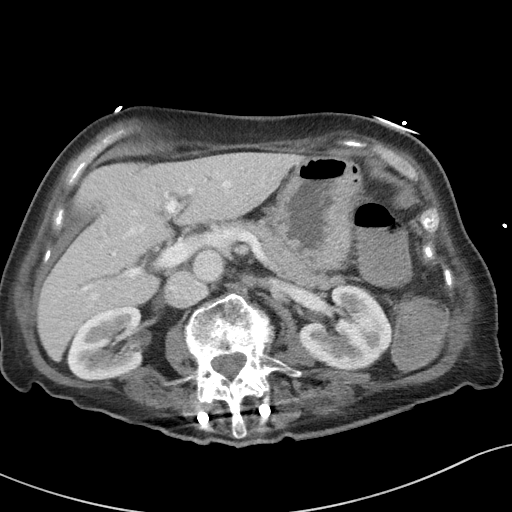
[im 55/80  bone]
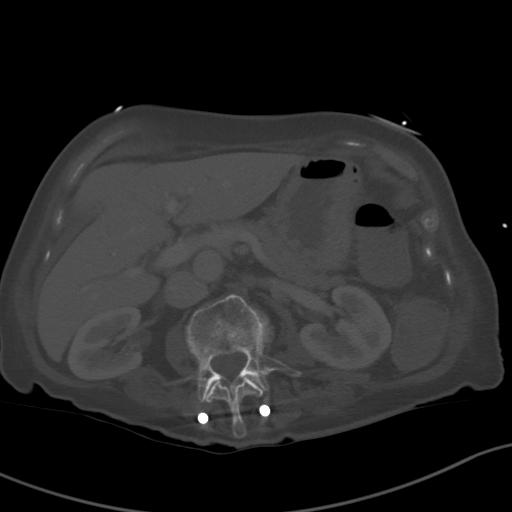
[im 61/80  soft-tissue]
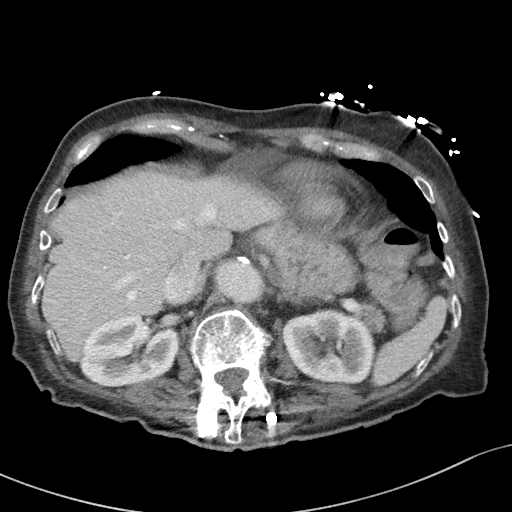
[im 67/80  soft-tissue]
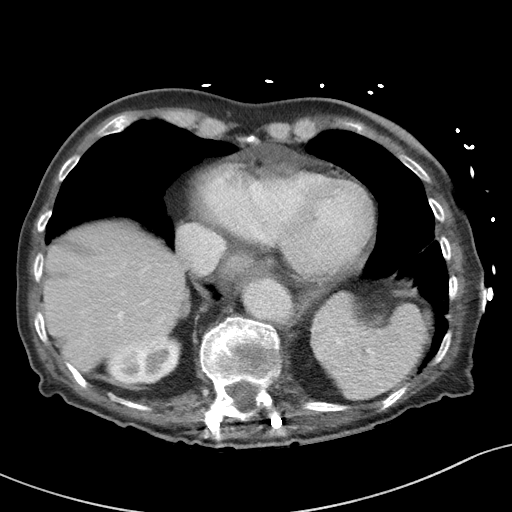
[im 73/80  soft-tissue]
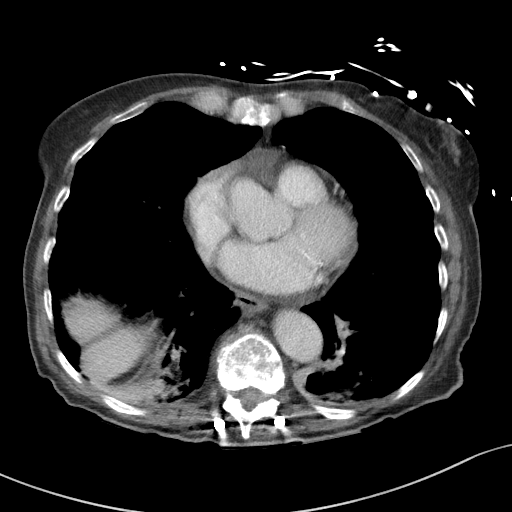

[Series 6: coronal st · coronal · 0.69mm/px · 3 of 108 slices shown]
[im 36/108  soft-tissue]
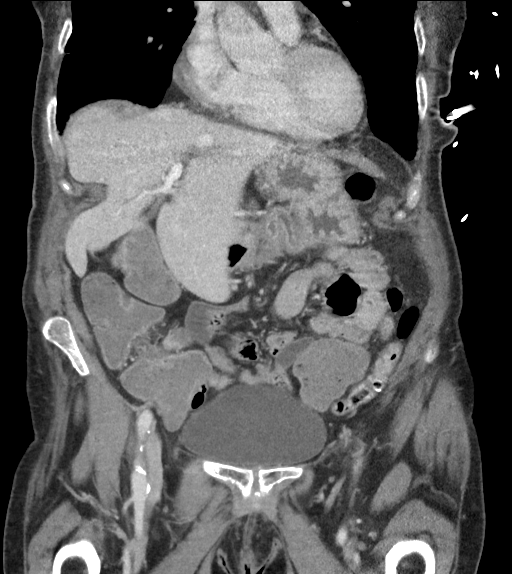
[im 48/108  soft-tissue]
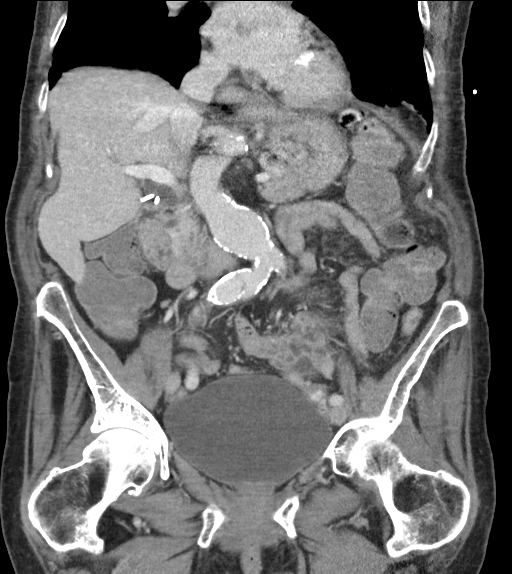
[im 60/108  soft-tissue]
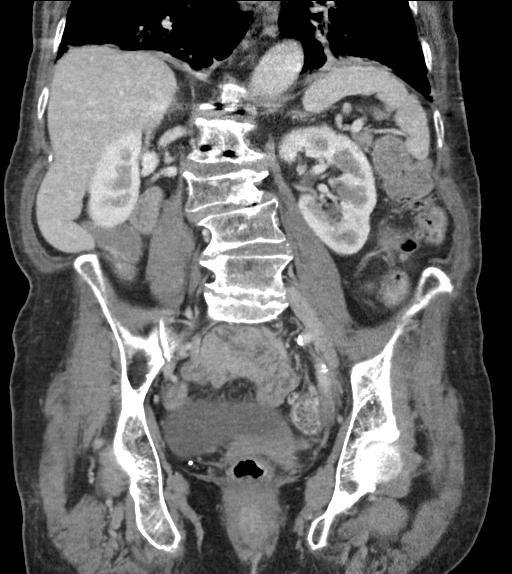

[15 of 46 positions shown; findings below may reference images not displayed]

FINDINGS: Lower chest: There is emphysematous changes of the lung bases.
Bibasilar patchy and streaky densities most likely
atelectasis/scarring. Pneumonia is not excluded. Clinical
correlation is recommended. There is multi vessel coronary vascular
calcification. There is a small pericardial effusion measuring
approximately 1 cm in thickness anterior to the heart. This is
relatively similar to prior CT.

There is no intra-abdominal free air or free fluid.

Hepatobiliary: The liver is unremarkable. There is mild intrahepatic
biliary ductal dilatation. Cholecystectomy. No retained calcified
stone noted in the central CBD.

Pancreas: Unremarkable. No pancreatic ductal dilatation or
surrounding inflammatory changes.

Spleen: Normal in size without focal abnormality.

Adrenals/Urinary Tract: The adrenal glands are unremarkable. There
is a 3 mm nonobstructing right renal interpolar calculus. There is
no hydronephrosis on either side. There is symmetric enhancement and
excretion of contrast by both kidneys. Visualized ureters and
urinary bladder appear unremarkable.

Stomach/Bowel: There is sigmoid diverticulosis without active
inflammatory changes. Loose stool noted throughout the colon. There
is no bowel obstruction or active inflammation. The appendix is not
visualized with certainty. No inflammatory changes identified in the
right lower quadrant.

Vascular/Lymphatic: Advanced aortoiliac atherosclerotic disease. The
abdominal aorta is tortuous. There is a 3.3 cm infrarenal abdominal
aortic aneurysm similar to prior CT. There is aneurysmal dilatation
of the right common iliac artery measuring up to 2.4 cm in diameter.
A low attenuating band like structure in the right common iliac
artery, likely represents an old dissection flap. The IVC is
unremarkable. No portal venous gas. There is no adenopathy.

Reproductive: Hysterectomy. No pelvic mass.

Other: None

Musculoskeletal: Osteopenia with scoliosis and extensive
degenerative changes of the spine. Multilevel old-appearing
compression fractures with lower thoracic/upper lumbar fixation
hardware. No acute osseous pathology.
IMPRESSION: 1. No acute intra-abdominal or pelvic pathology.
2. Sigmoid diverticulosis. No bowel obstruction or active
inflammation.
3. A 3.3 cm infrarenal abdominal aortic aneurysm and aneurysmal
dilatation of the right common iliac artery. Follow-up as per
recommendation of the prior CT.
4. Emphysema with bibasilar atelectasis/scarring.
5. Aortic Atherosclerosis (KC4BF-ICH.H) and Emphysema ([id number].E).

## 2021-04-06 IMAGING — DX DG ABDOMEN ACUTE W/ 1V CHEST
4 series · 4 of 4 positions shown · non-contrast
Comparison: 06/25/2018 acute abdominal series and 04/19/2019
radiographs

CLINICAL DATA: Constipation for 1 week.

EXAM:
DG ABDOMEN ACUTE W/ 1V CHEST

[chest pa]
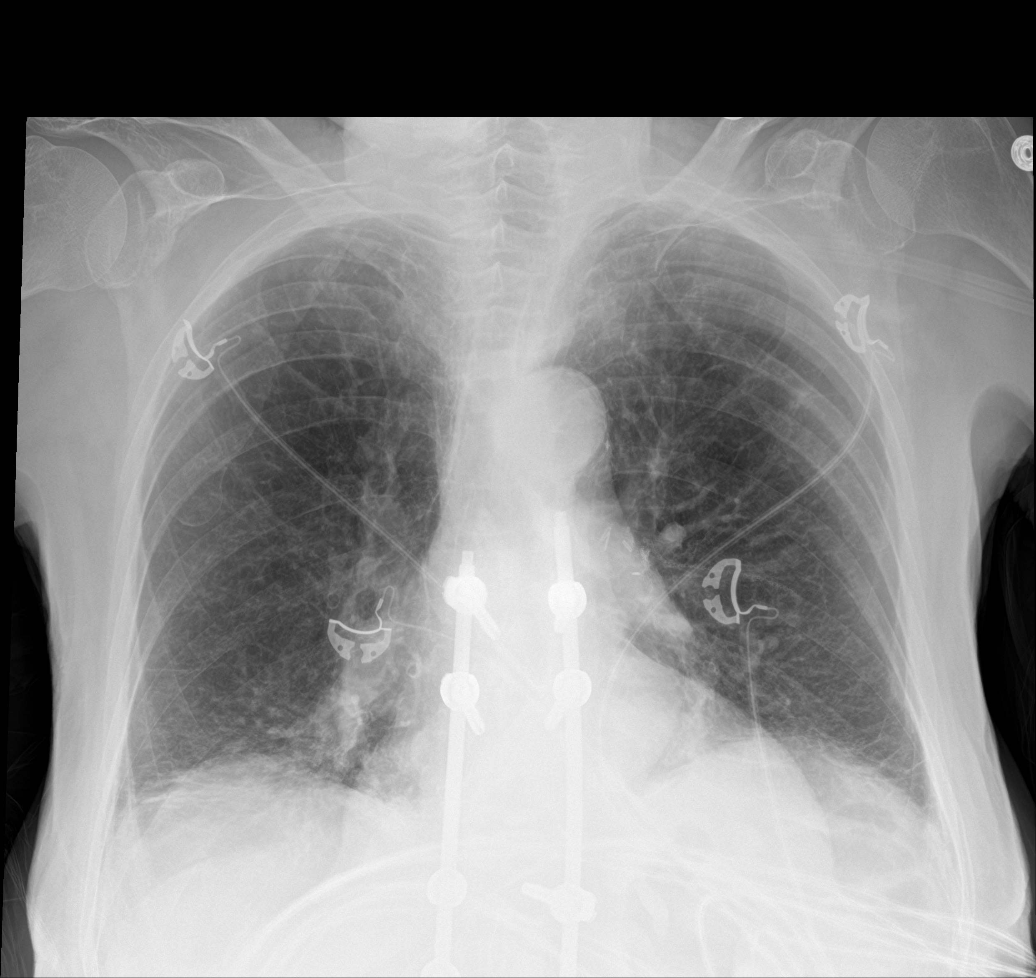

[abdomen erect]
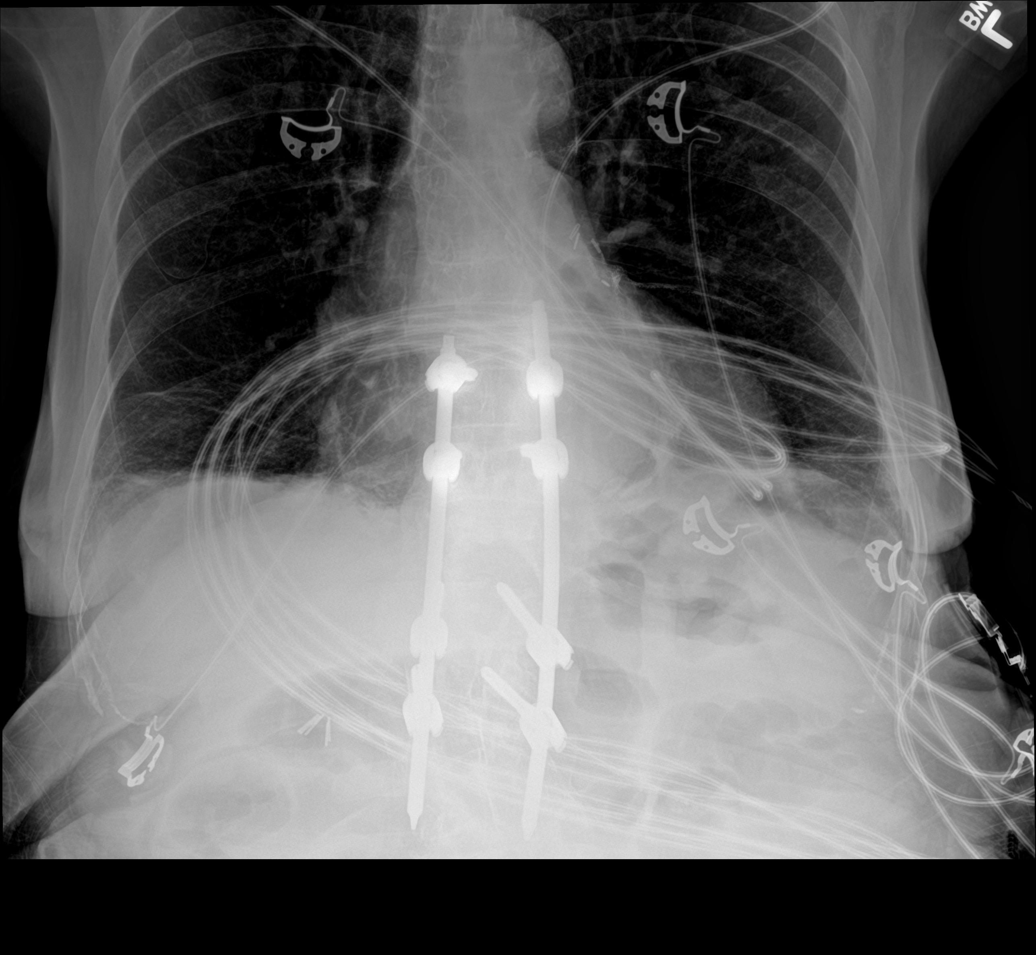

[abdomen supine (1 of 2)]
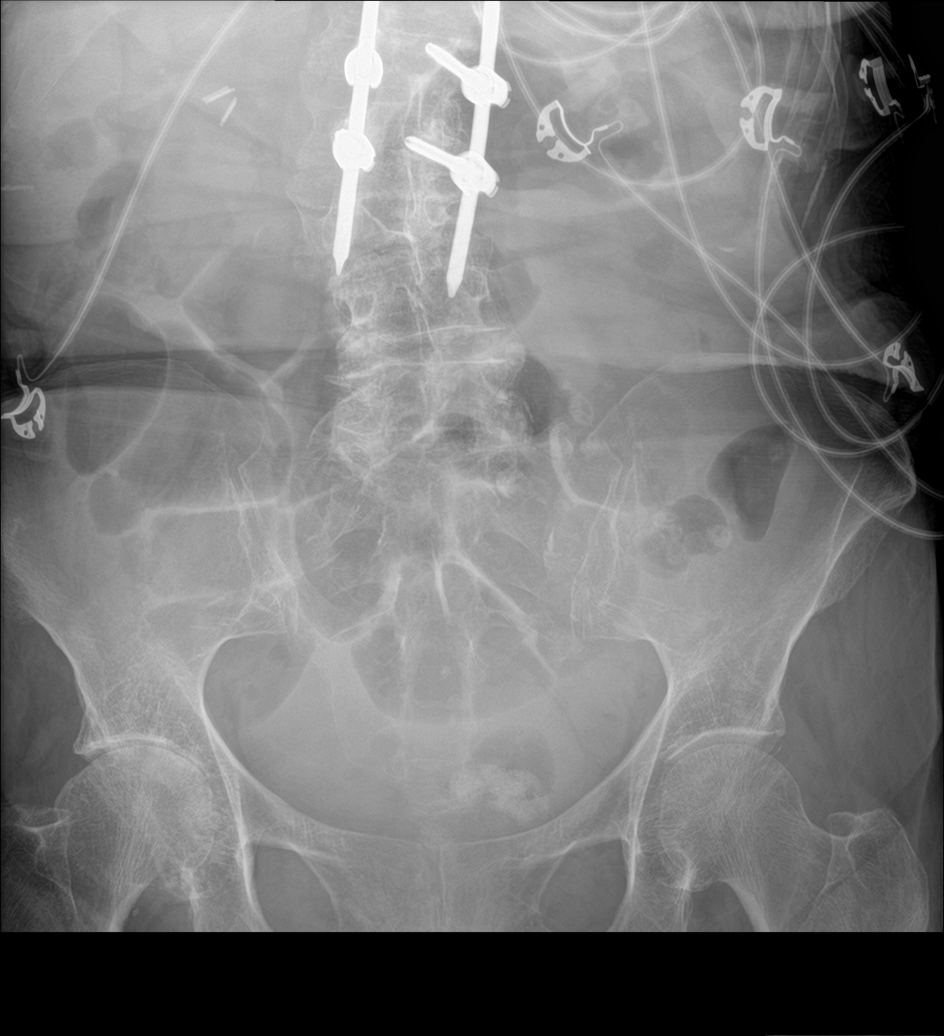

[abdomen supine (2 of 2)]
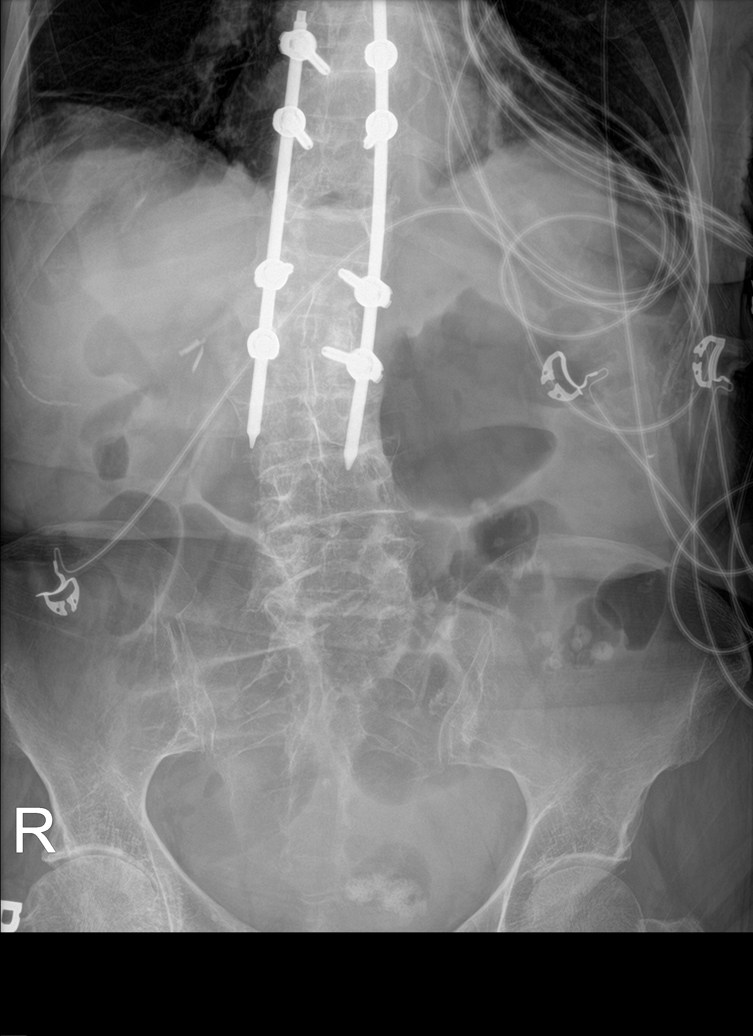

[4 of 4 positions shown; findings below may reference images not displayed]

FINDINGS: Cardiomegaly and mild bibasilar atelectasis/scarring again noted.

No pleural effusion, mass or pneumothorax.

Mildly distended gas-filled RIGHT and transverse colon noted.

A small amount of gas in the rectum is noted.

Little stool is noted within the colon.

No dilated small bowel loops are present.

No suspicious calcifications noted.

Thoracolumbar spinal fusion hardware again noted.
IMPRESSION: Nonspecific nonobstructive bowel gas pattern with mildly distended
RIGHT and transverse colon. No evidence of small bowel obstruction.

Cardiomegaly with unchanged mild bibasilar atelectasis/scarring.

## 2021-04-11 DIAGNOSIS — H524 Presbyopia: Secondary | ICD-10-CM | POA: Diagnosis not present

## 2021-04-11 DIAGNOSIS — H5213 Myopia, bilateral: Secondary | ICD-10-CM | POA: Diagnosis not present

## 2021-04-11 DIAGNOSIS — Z961 Presence of intraocular lens: Secondary | ICD-10-CM | POA: Diagnosis not present

## 2021-04-12 ENCOUNTER — Other Ambulatory Visit (HOSPITAL_COMMUNITY): Payer: Self-pay

## 2021-04-13 ENCOUNTER — Ambulatory Visit (INDEPENDENT_AMBULATORY_CARE_PROVIDER_SITE_OTHER): Payer: PPO

## 2021-04-13 DIAGNOSIS — Z0000 Encounter for general adult medical examination without abnormal findings: Secondary | ICD-10-CM | POA: Diagnosis not present

## 2021-04-13 DIAGNOSIS — Z780 Asymptomatic menopausal state: Secondary | ICD-10-CM | POA: Diagnosis not present

## 2021-04-13 NOTE — Patient Instructions (Signed)
[name] , Thank you for taking time to come for your Medicare Wellness Visit. I appreciate your ongoing commitment to your health goals. Please review the following plan we discussed and let me know if I can assist you in the future.   Screening recommendations/referrals: Colonoscopy: No longer required  Mammogram: No longer required  Bone Density: Currently due, orders placed this visit  Recommended yearly ophthalmology/optometry visit for glaucoma screening and checkup Recommended yearly dental visit for hygiene and checkup  Vaccinations: Influenza vaccine: Up to date, next due fall 2022  Pneumococcal vaccine: Completed series  Tdap vaccine: up to date, next due 04/09/2024 Shingles vaccine: Currently due for Shingrix, you may receive at the pharmacy    Advanced directives: Please bring a copy of your advanced medical directives so that we may scan into your chart.   Conditions/risks identified: None   Next appointment: None    Preventive Care 65 Years and Older, Female Preventive care refers to lifestyle choices and visits with your health care provider that can promote health and wellness. What does preventive care include? A yearly physical exam. This is also called an annual well check. Dental exams once or twice a year. Routine eye exams. Ask your health care provider how often you should have your eyes checked. Personal lifestyle choices, including: Daily care of your teeth and gums. Regular physical activity. Eating a healthy diet. Avoiding tobacco and drug use. Limiting alcohol use. Practicing safe sex. Taking low-dose aspirin every day. Taking vitamin and mineral supplements as recommended by your health care provider. What happens during an annual well check? The services and screenings done by your health care provider during your annual well check will depend on your age, overall health, lifestyle risk factors, and family history of disease. Counseling  Your  health care provider may ask you questions about your: Alcohol use. Tobacco use. Drug use. Emotional well-being. Home and relationship well-being. Sexual activity. Eating habits. History of falls. Memory and ability to understand (cognition). Work and work Statistician. Reproductive health. Screening  You may have the following tests or measurements: Height, weight, and BMI. Blood pressure. Lipid and cholesterol levels. These may be checked every 5 years, or more frequently if you are over 2 years old. Skin check. Lung cancer screening. You may have this screening every year starting at age 56 if you have a 30-pack-year history of smoking and currently smoke or have quit within the past 15 years. Fecal occult blood test (FOBT) of the stool. You may have this test every year starting at age 63. Flexible sigmoidoscopy or colonoscopy. You may have a sigmoidoscopy every 5 years or a colonoscopy every 10 years starting at age 30. Hepatitis C blood test. Hepatitis B blood test. Sexually transmitted disease (STD) testing. Diabetes screening. This is done by checking your blood sugar (glucose) after you have not eaten for a while (fasting). You may have this done every 1-3 years. Bone density scan. This is done to screen for osteoporosis. You may have this done starting at age 53. Mammogram. This may be done every 1-2 years. Talk to your health care provider about how often you should have regular mammograms. Talk with your health care provider about your test results, treatment options, and if necessary, the need for more tests. Vaccines  Your health care provider may recommend certain vaccines, such as: Influenza vaccine. This is recommended every year. Tetanus, diphtheria, and acellular pertussis (Tdap, Td) vaccine. You may need a Td booster every 10 years. Zoster  vaccine. You may need this after age 93. Pneumococcal 13-valent conjugate (PCV13) vaccine. One dose is recommended after age  20. Pneumococcal polysaccharide (PPSV23) vaccine. One dose is recommended after age 77. Talk to your health care provider about which screenings and vaccines you need and how often you need them. This information is not intended to replace advice given to you by your health care provider. Make sure you discuss any questions you have with your health care provider. Document Released: 10/21/2015 Document Revised: 06/13/2016 Document Reviewed: 07/26/2015 Elsevier Interactive Patient Education  2017 [employer] Prevention in the Home Falls can cause injuries. They can happen to people of all ages. There are many things you can do to make your home safe and to help prevent falls. What can I do on the outside of my home? Regularly fix the edges of walkways and driveways and fix any cracks. Remove anything that might make you trip as you walk through a door, such as a raised step or threshold. Trim any bushes or trees on the path to your home. Use bright outdoor lighting. Clear any walking paths of anything that might make someone trip, such as rocks or tools. Regularly check to see if handrails are loose or broken. Make sure that both sides of any steps have handrails. Any raised decks and porches should have guardrails on the edges. Have any leaves, snow, or ice cleared regularly. Use sand or salt on walking paths during winter. Clean up any spills in your garage right away. This includes oil or grease spills. What can I do in the bathroom? Use night lights. Install grab bars by the toilet and in the tub and shower. Do not use towel bars as grab bars. Use non-skid mats or decals in the tub or shower. If you need to sit down in the shower, use a plastic, non-slip stool. Keep the floor dry. Clean up any water that spills on the floor as soon as it happens. Remove soap buildup in the tub or shower regularly. Attach bath mats securely with double-sided non-slip rug tape. Do not have throw  rugs and other things on the floor that can make you trip. What can I do in the bedroom? Use night lights. Make sure that you have a light by your bed that is easy to reach. Do not use any sheets or blankets that are too big for your bed. They should not hang down onto the floor. Have a firm chair that has side arms. You can use this for support while you get dressed. Do not have throw rugs and other things on the floor that can make you trip. What can I do in the kitchen? Clean up any spills right away. Avoid walking on wet floors. Keep items that you use a lot in easy-to-reach places. If you need to reach something above you, use a strong step stool that has a grab bar. Keep electrical cords out of the way. Do not use floor polish or wax that makes floors slippery. If you must use wax, use non-skid floor wax. Do not have throw rugs and other things on the floor that can make you trip. What can I do with my stairs? Do not leave any items on the stairs. Make sure that there are handrails on both sides of the stairs and use them. Fix handrails that are broken or loose. Make sure that handrails are as long as the stairways. Check any carpeting to make sure that it  is firmly attached to the stairs. Fix any carpet that is loose or worn. Avoid having throw rugs at the top or bottom of the stairs. If you do have throw rugs, attach them to the floor with carpet tape. Make sure that you have a light switch at the top of the stairs and the bottom of the stairs. If you do not have them, ask someone to add them for you. What else can I do to help prevent falls? Wear shoes that: Do not have high heels. Have rubber bottoms. Are comfortable and fit you well. Are closed at the toe. Do not wear sandals. If you use a stepladder: Make sure that it is fully opened. Do not climb a closed stepladder. Make sure that both sides of the stepladder are locked into place. Ask someone to hold it for you, if  possible. Clearly mark and make sure that you can see: Any grab bars or handrails. First and last steps. Where the edge of each step is. Use tools that help you move around (mobility aids) if they are needed. These include: Canes. Walkers. Scooters. Crutches. Turn on the lights when you go into a dark area. Replace any light bulbs as soon as they burn out. Set up your furniture so you have a clear path. Avoid moving your furniture around. If any of your floors are uneven, fix them. If there are any pets around you, be aware of where they are. Review your medicines with your doctor. Some medicines can make you feel dizzy. This can increase your chance of falling. Ask your doctor what other things that you can do to help prevent falls. This information is not intended to replace advice given to you by your health care provider. Make sure you discuss any questions you have with your health care provider. Document Released: 07/21/2009 Document Revised: 03/01/2016 Document Reviewed: 10/29/2014 Elsevier Interactive Patient Education  2017 [employer].

## 2021-04-13 NOTE — Progress Notes (Signed)
Subjective:   [name] is a 80 y.o. female who presents for an Initial Medicare Annual Wellness Visit.  I connected with Judie Hollick  today by telephone and verified that I am speaking with the correct person using two identifiers. Location patient: home Location provider: work Persons participating in the virtual visit: patient, provider.   I discussed the limitations, risks, security and privacy concerns of performing an evaluation and management service by telephone and the availability of in person appointments. I also discussed with the patient that there may be a patient responsible charge related to this service. The patient expressed understanding and verbally consented to this telephonic visit.    Interactive audio and video telecommunications were attempted between this provider and patient, however failed, due to patient having technical difficulties OR patient did not have access to video capability.  We continued and completed visit with audio only.    Review of Systems    N/A  Cardiac Risk Factors include: advanced age (>18men, >42 women);hypertension;dyslipidemia     Objective:    Today's Vitals   04/13/21 1034  PainSc: 4    There is no height or weight on file to calculate BMI.  Advanced Directives 04/13/2021 02/07/2021 06/23/2020 06/22/2020 05/12/2020 05/05/2020 03/30/2020  Does Patient Have a Medical Advance Directive? Yes Yes No No Yes Yes No  Type of Advance Directive Out of facility DNR (pink MOST or yellow form) [place];Living will [place];Living will - [place];Living will [place] -  Does patient want to make changes to medical advance directive? - - - - No - Patient declined - -  Copy of [place] in Chart? - No - copy requested No - copy requested - No - copy requested No - copy requested -  Would patient like information on creating a medical advance  directive? - - - No - Patient declined - - -  Pre-existing out of facility DNR order (yellow form or pink MOST form) - - - - - - -    Current Medications (verified) Outpatient Encounter Medications as of 04/13/2021  Medication Sig   albuterol (VENTOLIN HFA) 108 (90 Base) MCG/ACT inhaler INHALE 2 PUFFS EVERY 6 HOURS AS NEEDED FOR WHEEZING OR SHORTNESS OF BREATH.   ALPRAZolam (XANAX) 0.5 MG tablet TAKE (1) TABLET BY MOUTH TWICE A DAY AS NEEDED.   citalopram (CELEXA) 40 MG tablet TAKE (1/2) TABLET BY MOUTH AT BEDTIME.   diphenhydrAMINE (BENADRYL) 50 MG tablet Take 1 tablet (50 mg total) by mouth once for 1 dose. Take one hour prior to CT scan   ferrous sulfate 325 (65 FE) MG tablet Take 325 mg by mouth daily with breakfast.   fluticasone (FLONASE) 50 MCG/ACT nasal spray Place 2 sprays into both nostrils daily.   Fluticasone-Umeclidin-Vilant (TRELEGY ELLIPTA) 100-62.5-25 MCG/INH AEPB INHALE 1 PUFF INTO LUNGS DAILY.   gabapentin (NEURONTIN) 100 MG capsule TAKE 1 CAPSULE BY MOUTH THREE TIMES A DAY.   levocetirizine (XYZAL) 5 MG tablet TAKE ONE TABLET BY MOUTH ONCE DAILY.   levothyroxine (SYNTHROID) 50 MCG tablet TAKE 1 TABLET BEFORE BREAKFAST.   Multiple Vitamin (MULTIVITAMIN WITH MINERALS) TABS tablet Take 1 tablet by mouth daily.   ondansetron (ZOFRAN) 4 MG tablet TAKE 1 TABLET BY MOUTH 3 TIMES DAILY AS NEEDED FOR NAUSEA AND VOMITING.   osimertinib mesylate (TAGRISSO) 80 MG tablet TAKE 1 TABLET (80 MG TOTAL) BY MOUTH DAILY.   OVER THE COUNTER MEDICATION  Bone Support 2-4 tabs daily   OXcarbazepine (TRILEPTAL) 150 MG tablet TAKE (1) TABLET BY MOUTH TWICE DAILY.   pantoprazole (PROTONIX) 40 MG tablet TAKE (1) TABLET BY MOUTH ONCE DAILY BEFORE BREAKFAST.   Probiotic Product (ALIGN PO) Take by mouth daily.   PROLIA 60 MG/ML SOSY injection INJECT INTO UPPER ARM, THIGH, OR ABDOMEN ONCE.   predniSONE (DELTASONE) 50 MG tablet Please take one tablet at 13 hours, 7 hours and one hour prior to CT scan  (Patient not taking: Reported on 04/13/2021)   No facility-administered encounter medications on file as of 04/13/2021.    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[place])    corrected by surgery 08/21/13   Anxiety disorder    Aortic aneurysm ([place])    Brain metastasis ([place])    lung cancer s/p resection 7/20   Cancer ([place])    Phreesia 63/10/6008   Complication of anesthesia 2011   bloodpressure dropped during colonoscopy, not problems since   Compression fracture    COPD (chronic obstructive pulmonary disease) (HCC)    Depression    Diastolic dysfunction    Diverticulosis    Dysphagia    Emphysema lung ([place]) 11/08/2014   Headache    Hiatal hernia    History of kidney stones    Hypothyroidism    IBS (irritable bowel syndrome)    Iron deficiency anemia due to chronic blood loss 12/05/2016   On home O2 12/15/2013   chronic hypoxia   Pneumonia    PUD (peptic ulcer disease)    44yrs   Shortness of breath    with exertion   SIADH (syndrome of inappropriate ADH production) ([place])    Trigeminal neuralgia    Past Surgical History:  Procedure Laterality Date   ABDOMINAL HYSTERECTOMY     APPLICATION OF CRANIAL NAVIGATION N/A 03/18/2019   Procedure: APPLICATION OF CRANIAL NAVIGATION;  Surgeon: Consuella Lose, MD;  Location: [facility];  Service: Neurosurgery;  Laterality: N/A;   BACK SURGERY  January 13, 2014   BRAIN SURGERY N/A    Phreesia 03/28/2020   CATARACT EXTRACTION     CATARACT EXTRACTION W/PHACO  09/02/2012   Procedure: CATARACT EXTRACTION PHACO AND INTRAOCULAR LENS PLACEMENT (IOC);  Surgeon: Elta Guadeloupe T. Gershon Crane, MD;  Location: [facility];  Service: Ophthalmology;  Laterality: Left;  CDE:10.35   CHOLECYSTECTOMY     COLONOSCOPY   2011   hyperplastic polyp, 3-4 small cecal AVMs, nonbleeding   COLONOSCOPY N/A 11/23/2014   Dr. Oneida Alar: moderate diverticulosis, hemorrhoids, redundant colon. next TCS in 10-15 years.    CRANIOTOMY Left 03/18/2019   Procedure: Left stereotactic craniotomy for tumor resection;  Surgeon: Consuella Lose, MD;  Location: [facility];  Service: Neurosurgery;  Laterality: Left;  Left stereotactic craniotomy for tumor resection   ENDARTERECTOMY Right 08/21/2013   Procedure: RIGHT CAROTID ANEURYSM RESECTION;  Surgeon: Rosetta Posner, MD;  Location: [facility];  Service: Vascular;  Laterality: Right;   ESOPHAGEAL DILATION  02/24/2018   Procedure: ESOPHAGEAL DILATION;  Surgeon: Danie Binder, MD;  [facility];  Service: Endoscopy;;   ESOPHAGOGASTRODUODENOSCOPY  11/13/2009   w/dilation to 38mm, gastric ulceration (H.Pylori) s/p treatment   ESOPHAGOGASTRODUODENOSCOPY  11/21/2009   distal esophageal web, gastritis   ESOPHAGOGASTRODUODENOSCOPY  03/14/12   XNA:[id number] in  the distal esophagus/Mild gastritis/small HH. + H.pylori, prescribed Pylera. Finished treatment.    ESOPHAGOGASTRODUODENOSCOPY N/A 12/13/2017   Dr. Oneida Alar: esophageal web s/p dilation, gastritis, no h.pylori   ESOPHAGOGASTRODUODENOSCOPY N/A 02/24/2018   Dr. Oneida Alar: web in prox esophagus and in distal esophagus s/p dilation, mild gastritis, mild pyloric stenosis, mild post ulcer duodenal deformity at D1/D2   fatty tumor removal from lt groin     FRACTURE SURGERY Left    hand and left ring finger   GIVENS CAPSULE STUDY N/A 12/26/2017   incomplete   GIVENS CAPSULE STUDY N/A 02/24/2018   normal small bowel   KNEE SURGERY     patella tendon repair june 2019 [facility]   LUNG CANCER SURGERY  12/2010   Left VATS, minithoracotomy, LLL superior segmentectomy   ORIF PATELLA Right 03/18/2018   Procedure: OPEN REDUCTION INTERNAL (ORIF) FIXATION RIGHT PATELLA;  Surgeon: Carole Civil, MD;  Location[facility];  Service: Orthopedics;  Laterality:  Right;   ORIF WRIST FRACTURE Left 04/09/2014   Procedure: OPEN REDUCTION INTERNAL FIXATION (ORIF) WRIST FRACTURE;  Surgeon: Renette Butters, MD;  Location: [facility];  Service: Orthopedics;  Laterality: Left;   PERCUTANEOUS PINNING Left 04/09/2014   Procedure: PERCUTANEOUS PINNING EXTREMITY;  Surgeon: Renette Butters, MD;  Location: [facility];  Service: Orthopedics;  Laterality: Left;   SAVORY DILATION N/A 12/13/2017   Procedure: SAVORY DILATION;  Surgeon: Danie Binder, MD;  [facility];  Service: Endoscopy;  Laterality: N/A;   TUBAL LIGATION     vocal cord surgery  02/06/2011   laryngoscopy with bilateral vocal cord Radiesse injection for vocal cord paralysis   YAG LASER APPLICATION Left 34/12/5246   Procedure: YAG LASER APPLICATION;  Surgeon: Rutherford Guys, MD;  [facility];  Service: Ophthalmology;  Laterality: Left;   Family History  Problem Relation Age of Onset   Pulmonary embolism Mother    Heart attack Father    Hypertension Father    Liver cancer Sister    Cancer Sister    Cancer Brother    Cancer Daughter    Breast cancer Daughter    Colon cancer Neg Hx    Social History   Socioeconomic History   Marital status: Divorced    Spouse name: Not on file   Number of children: Not on file   Years of education: Not on file   Highest education level: Not on file  Occupational History   Not on file  Tobacco Use   Smoking status: Former    Packs/day: 0.50    Years: 20.00    Pack years: 10.00    Types: Cigarettes    Quit date: 12/21/2010    Years since quitting: 10.3   Smokeless tobacco: Never   Tobacco comments:    smoking cessation info given and reviewed   Vaping Use   Vaping Use: Never used  Substance and Sexual Activity   Alcohol use: Not Currently    Alcohol/week: 0.0 standard drinks   Drug use: No   Sexual activity: Yes    Birth control/protection: Surgical  Other Topics Concern   Not on file  Social History Narrative   Divorced since 12.Lives alone  with a dog.Marland KitchenRetired.  She receives Meals on Wheels.   Social Determinants of Health   Financial Resource Strain: Low Risk    Difficulty of Paying Living Expenses: Not hard at all  Food Insecurity: No Food Insecurity   Worried About Charity fundraiser in the Last Year: Never  true   Ran Out of Food in the Last Year: Never true  Transportation Needs: No Transportation Needs   Lack of Transportation (Medical): No   Lack of Transportation (Non-Medical): No  Physical Activity: Inactive   Days of Exercise per Week: 0 days   Minutes of Exercise per Session: 0 min  Stress: No Stress Concern Present   Feeling of Stress : Not at all  Social Connections: Socially Isolated   Frequency of Communication with Friends and Family: More than three times a week   Frequency of Social Gatherings with Friends and Family: More than three times a week   Attends Religious Services: Never   Marine scientist or Organizations: No   Attends Music therapist: Never   Marital Status: Divorced    Tobacco Counseling Counseling given: Not Answered Tobacco comments: smoking cessation info given and reviewed    Clinical Intake:  Pre-visit preparation completed: Yes  Pain : 0-10 Pain Score: 4  Pain Type: Chronic pain Pain Location: Back Pain Onset: More than a month ago Pain Frequency: Constant     Nutritional Risks: Nausea/ vomitting/ diarrhea (Nausea) Diabetes: No  How often do you need to have someone help you when you read instructions, pamphlets, or other written materials from your doctor or pharmacy?: 1 - Never  Diabetic?No   Interpreter Needed?: No  Information entered by :: [place] of Daily Living In your present state of health, do you have any difficulty performing the following activities: 04/13/2021  Hearing? N  Vision? N  Difficulty concentrating or making decisions? N  Walking or climbing stairs? Y  Dressing or bathing? N  Doing errands,  shopping? Y  Preparing Food and eating ? N  Using the Toilet? N  In the past six months, have you accidently leaked urine? N  Do you have problems with loss of bowel control? N  Managing your Medications? N  Managing your Finances? N  Housekeeping or managing your Housekeeping? N  Some recent data might be hidden    Patient Care Team: [name], MD as PCP - General (Family Medicine) Herminio Commons, MD (Inactive) as PCP - Cardiology (Cardiology) Danie Binder, MD (Inactive) (Gastroenterology) Nicanor Alcon, MD (Thoracic Surgery) Everardo All, MD (Hematology and Oncology) Edythe Clarity, [facility] as Pharmacist (Pharmacist)  Indicate any recent Medical Services you may have received from other than [facility] providers in the past year (date may be approximate).     Assessment:   This is a routine wellness examination for [name].  Hearing/Vision screen Vision Screening - Comments:: Patient stated that she gets eye exams once per year.  Dietary issues and exercise activities discussed: Current Exercise Habits: The patient does not participate in regular exercise at present, Exercise limited by: orthopedic condition(s)   Goals Addressed   None    Depression Screen PHQ 2/9 Scores 04/13/2021 04/27/2019 04/30/2018 06/11/2017 05/01/2016  PHQ - 2 Score 0 0 2 0 0  PHQ- 9 Score - - 6 - -    Fall Risk Fall Risk  04/13/2021 04/30/2018 06/11/2017  Falls in the past year? 0 Yes Yes  Number falls in past yr: 0 1 1  Injury with Fall? 0 Yes -  Risk Factor Category  - High Fall Risk -  Risk for fall due to : Impaired balance/gait;History of fall(s) Impaired balance/gait;Impaired mobility -  Follow up Falls evaluation completed;Falls prevention discussed - -    FALL RISK PREVENTION PERTAINING TO THE  HOME:  Any stairs in or around the home? No  If so, are there any without handrails? No  Home free of loose throw rugs in walkways, pet beds, electrical cords, etc? Yes  Adequate  lighting in your home to reduce risk of falls? Yes   ASSISTIVE DEVICES UTILIZED TO PREVENT FALLS:  Life alert? No  Use of a cane, walker or w/c? Yes  Grab bars in the bathroom? Yes  Shower chair or bench in shower? Yes  Elevated toilet seat or a handicapped toilet? Yes     Cognitive Function:  Normal cognitive status assessed by direct observation by this Nurse Health Advisor. No abnormalities found.     6CIT Screen 04/13/2021  What Year? 0 points  What month? 0 points  What time? 0 points  Count back from 20 4 points  Months in reverse 2 points  Repeat phrase 4 points  Total Score 10    Immunizations Immunization History  Administered Date(s) Administered   Fluad Quad(high Dose 65+) 06/19/2019, 07/20/2020   Hepatitis B 02/17/2007, 04/17/2007, 09/12/2007   Influenza, High Dose Seasonal PF 07/11/2018   Influenza,inj,Quad PF,6+ Mos 07/12/2015, 07/12/2016, 06/11/2017   Pneumococcal Conjugate-13 11/27/2019   Pneumococcal Polysaccharide-23 07/19/2014   Td 02/17/2007   Tdap 04/09/2014   Zoster, Live 01/12/2009    TDAP status: Up to date  Flu Vaccine status: Up to date  Pneumococcal vaccine status: Up to date  Covid-19 vaccine status: Declined, Education has been provided regarding the importance of this vaccine but patient still declined. Advised may receive this vaccine at local pharmacy or Health Dept.or vaccine clinic. Aware to provide a copy of the vaccination record if obtained from local pharmacy or Health Dept. Verbalized acceptance and understanding.  Qualifies for Shingles Vaccine? Yes   Zostavax completed Yes   Shingrix Completed?: No.    Education has been provided regarding the importance of this vaccine. Patient has been advised to call insurance company to determine out of pocket expense if they have not yet received this vaccine. Advised may also receive vaccine at local pharmacy or Health Dept. Verbalized acceptance and understanding.  Screening  Tests Health Maintenance  Topic Date Due   COVID-19 Vaccine (1) Never done   Hepatitis C Screening  Never done   Zoster Vaccines- Shingrix (1 of 2) Never done   INFLUENZA VACCINE  05/08/2021   TETANUS/TDAP  04/09/2024   DEXA SCAN  Completed   PNA vac Low Risk Adult  Completed   HPV VACCINES  Aged Out    Health Maintenance  Health Maintenance Due  Topic Date Due   COVID-19 Vaccine (1) Never done   Hepatitis C Screening  Never done   Zoster Vaccines- Shingrix (1 of 2) Never done    Colorectal cancer screening: Type of screening: Colonoscopy. Completed 11/23/2014. Repeat every 10 years  Mammogram status: No longer required due to age.  Bone Density status: Ordered 04/13/2021. Pt provided with contact info and advised to call to schedule appt.  Lung Cancer Screening: (Low Dose CT Chest recommended if Age 39-80 years, 30 pack-year currently smoking OR have quit w/in 15years.) does not qualify.   Lung Cancer Screening Referral: N/A   Additional Screening:  Hepatitis C Screening: does qualify;   Vision Screening: Recommended annual ophthalmology exams for early detection of glaucoma and other disorders of the eye. Is the patient up to date with their annual eye exam?  Yes  Who is the provider or what is the name of the office in  which the patient attends annual eye exams? Dr. Gershon Crane  If pt is not established with a provider, would they like to be referred to a provider to establish care? No .   Dental Screening: Recommended annual dental exams for proper oral hygiene  Community Resource Referral / Chronic Care Management: CRR required this visit?  No   CCM required this visit?  No      Plan:     I have personally reviewed and noted the following in the patient's chart:   Medical and social history Use of alcohol, tobacco or illicit drugs  Current medications and supplements including opioid prescriptions. Patient is not currently taking opioid  prescriptions. Functional ability and status Nutritional status Physical activity Advanced directives List of other physicians Hospitalizations, surgeries, and ER visits in previous 12 months Vitals Screenings to include cognitive, depression, and falls Referrals and appointments  In addition, I have reviewed and discussed with patient certain preventive protocols, quality metrics, and best practice recommendations. A written personalized care plan for preventive services as well as general preventive health recommendations were provided to patient.     Ofilia Neas, LPN   05/11/4195   Nurse Notes: None

## 2021-04-17 ENCOUNTER — Other Ambulatory Visit: Payer: Self-pay

## 2021-04-17 ENCOUNTER — Inpatient Hospital Stay: Payer: PPO

## 2021-04-17 ENCOUNTER — Inpatient Hospital Stay: Payer: PPO | Attending: Internal Medicine | Admitting: Hematology and Oncology

## 2021-04-17 ENCOUNTER — Encounter: Payer: Self-pay | Admitting: Hematology and Oncology

## 2021-04-17 VITALS — BP 100/68 | HR 62 | Temp 97.5°F | Resp 18 | Wt 108.2 lb

## 2021-04-17 DIAGNOSIS — C3492 Malignant neoplasm of unspecified part of left bronchus or lung: Secondary | ICD-10-CM

## 2021-04-17 DIAGNOSIS — E039 Hypothyroidism, unspecified: Secondary | ICD-10-CM | POA: Insufficient documentation

## 2021-04-17 DIAGNOSIS — C7931 Secondary malignant neoplasm of brain: Secondary | ICD-10-CM | POA: Diagnosis not present

## 2021-04-17 DIAGNOSIS — C3432 Malignant neoplasm of lower lobe, left bronchus or lung: Secondary | ICD-10-CM | POA: Insufficient documentation

## 2021-04-17 LAB — CBC WITH DIFFERENTIAL (CANCER CENTER ONLY)
Abs Immature Granulocytes: 0.01 10*3/uL (ref 0.00–0.07)
Basophils Absolute: 0 10*3/uL (ref 0.0–0.1)
Basophils Relative: 1 %
Eosinophils Absolute: 0.1 10*3/uL (ref 0.0–0.5)
Eosinophils Relative: 2 %
HCT: 34.8 % — ABNORMAL LOW (ref 36.0–46.0)
Hemoglobin: 12 g/dL (ref 12.0–15.0)
Immature Granulocytes: 0 %
Lymphocytes Relative: 13 %
Lymphs Abs: 0.6 10*3/uL — ABNORMAL LOW (ref 0.7–4.0)
MCH: 30.8 pg (ref 26.0–34.0)
MCHC: 34.5 g/dL (ref 30.0–36.0)
MCV: 89.5 fL (ref 80.0–100.0)
Monocytes Absolute: 0.4 10*3/uL (ref 0.1–1.0)
Monocytes Relative: 9 %
Neutro Abs: 3.4 10*3/uL (ref 1.7–7.7)
Neutrophils Relative %: 75 %
Platelet Count: 213 10*3/uL (ref 150–400)
RBC: 3.89 MIL/uL (ref 3.87–5.11)
RDW: 12.7 % (ref 11.5–15.5)
WBC Count: 4.5 10*3/uL (ref 4.0–10.5)
nRBC: 0 % (ref 0.0–0.2)

## 2021-04-17 LAB — TSH: TSH: 2.057 u[IU]/mL (ref 0.308–3.960)

## 2021-04-17 LAB — CMP (CANCER CENTER ONLY)
ALT: 10 U/L (ref 0–44)
AST: 16 U/L (ref 15–41)
Albumin: 4 g/dL (ref 3.5–5.0)
Alkaline Phosphatase: 39 U/L (ref 38–126)
Anion gap: 6 (ref 5–15)
BUN: 17 mg/dL (ref 8–23)
CO2: 28 mmol/L (ref 22–32)
Calcium: 9.3 mg/dL (ref 8.9–10.3)
Chloride: 97 mmol/L — ABNORMAL LOW (ref 98–111)
Creatinine: 0.64 mg/dL (ref 0.44–1.00)
GFR, Estimated: >60 mL/min (ref >60)
Glucose, Bld: 88 mg/dL (ref 70–99)
Potassium: 4.2 mmol/L (ref 3.5–5.1)
Sodium: 131 mmol/L — ABNORMAL LOW (ref 135–145)
Total Bilirubin: 0.3 mg/dL (ref 0.3–1.2)
Total Protein: 6.6 g/dL (ref 6.5–8.1)

## 2021-04-17 NOTE — Progress Notes (Signed)
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 0/14 lymph nodes.  No perineural invasion or LVI.  negative margins 2) 03/03/2019: presented to ED with expressive aphasia, found to have multiple brain metastasis and underwent SRS on 03/17/2019. 3) 03/18/2019: Stereotactic left parietal craniotomy for resection of tumor 4) 04/30/2020: Brain MRI on showed 2 new subcentimeter meta stasis in the right cerebellum and right frontal lobe. 5)  05/20/2020: Stereotactic radiation therapy was completed 6) 06/23/2020: establish care with Dr. [name]  7) 08/29/2020: prescribed osimirtinib therapy due to next metastatic disease in the brain.  8) 09/05/2020: started osimirtinib 17m PO daily 9) 10/19/2020: CT Chest showed no evidence of residual disease 10) 11/21/2020: desquamation and erythema of finger tips. Concern for HFS. Temporarily holding therapy  11) 12/05/2020: restarted Tagrisso after resolution of the HFS symptoms  Interval History:  [name] 80y.o. female with medical history significant for metastatic adenocarcinoma of the lung with brain metastasis who presents for a follow up visit. The patient's last visit was on 03/22/2021.  In the interim since the last visit the patient has continued on her Tagrisso with no major isuses.   On exam today [name] reports that there is "nothing new" in her health.  She notes that she has "tummy  problems" but that she is always had issues like this due to H. pylori and irritable bowel syndrome.  She notes that her predominantly consist of cramping and some occasional diarrhea.  She notes that her hands are doing quite well with no pain or dry cracked fingers.  She also reports that her appetite has been good and she is been eating well.  She is at her baseline level of 2 L of oxygen.  She otherwise has been quite well and is willing and able to proceed with osimertinib therapy.  She currently denies any fevers, chills, sweats, nausea vomiting or diarrhea.  A full ten point ROS is listed below.  MEDICAL HISTORY:  Past Medical History:  Diagnosis Date   [place] of [place] ([facility] 12/[place]   left lung/surg only   Allergic rhinitis    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[facility]    corrected by surgery 08/21/13   Anxiety disorder    Aortic aneurysm ([facility]    [place]rain [place]    lung cancer s/p resection 7/20   Cancer ([facility]    Phreesia 049/44/9675  Complication of anesthesia 2011   bloodpressure dropped during colonoscopy, not problems since   Compression fracture    COPD (chronic obstructive pulmonary disease) (HCC)    Depression    Diastolic dysfunction    Diverticulosis    Dysphagia    Emphysema lung [place] [place] 11/08/2014   Headache    Hiatal hernia    History of kidney stones    Hypothyroidism    IBS (irritable bowel syndrome)    Iron deficiency anemia due to chronic blood loss 12/05/2016   On home O2 12/15/2013   chronic hypoxia   Pneumonia    PUD (peptic ulcer disease)  17yrs   Shortness of breath    with exertion   SIADH (syndrome of inappropriate ADH production) (HCC)    Trigeminal neuralgia     SURGICAL HISTORY: Past Surgical History:  Procedure Laterality Date   ABDOMINAL HYSTERECTOMY     APPLICATION OF CRANIAL NAVIGATION N/A 03/18/2019   Procedure: APPLICATION OF CRANIAL NAVIGATION;  Surgeon: Consuella Lose, MD;  Location: [place];  Service: Neurosurgery;  Laterality:  N/A;   BACK SURGERY  January 13, 2014   BRAIN SURGERY N/A    Phreesia 03/28/2020   CATARACT EXTRACTION     CATARACT EXTRACTION W/PHACO  09/02/2012   Procedure: CATARACT EXTRACTION PHACO AND INTRAOCULAR LENS PLACEMENT (IOC);  Surgeon: Elta Guadeloupe T. 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 Oneida Alar: moderate diverticulosis, hemorrhoids, redundant colon. next TCS in 10-15 years.    CRANIOTOMY Left 03/18/2019   Procedure: Left stereotactic craniotomy for tumor resection;  Surgeon: Consuella Lose, MD;  Location: [place];  Service: Neurosurgery;  Laterality: Left;  Left stereotactic craniotomy for tumor resection   ENDARTERECTOMY Right 08/21/2013   Procedure: RIGHT CAROTID ANEURYSM RESECTION;  Surgeon: Rosetta Posner, MD;  Location: [place];  Service: Vascular;  Laterality: Right;   ESOPHAGEAL DILATION  02/24/2018   Procedure: ESOPHAGEAL DILATION;  Surgeon: Danie Binder, MD;  [facility];  Service: Endoscopy;;   ESOPHAGOGASTRODUODENOSCOPY  11/13/2009   w/dilation to 69mm, gastric ulceration (H.Pylori) s/p treatment   ESOPHAGOGASTRODUODENOSCOPY  11/21/2009   distal esophageal web, gastritis   ESOPHAGOGASTRODUODENOSCOPY  03/14/12   HWT:[id number] in the distal esophagus/Mild gastritis/small HH. + H.pylori, prescribed Pylera. Finished treatment.    ESOPHAGOGASTRODUODENOSCOPY N/A 12/13/2017   Dr. Oneida Alar: esophageal web s/p dilation, gastritis, no h.pylori   ESOPHAGOGASTRODUODENOSCOPY N/A 02/24/2018   Dr. Oneida Alar: web in prox esophagus and in distal esophagus s/p dilation, mild gastritis, mild pyloric stenosis, mild post ulcer duodenal deformity at D1/D2   fatty tumor removal from lt groin     FRACTURE SURGERY Left    hand and left ring finger   GIVENS CAPSULE STUDY N/A 12/26/2017   incomplete   GIVENS CAPSULE STUDY N/A 02/24/2018    normal small bowel   KNEE SURGERY     patella tendon repair june 2019 [facility]   LUNG CANCER SURGERY  12/2010   Left VATS, minithoracotomy, LLL superior segmentectomy   ORIF PATELLA Right 03/18/2018   Procedure: OPEN REDUCTION INTERNAL (ORIF) FIXATION RIGHT PATELLA;  Surgeon: Carole Civil, MD;  Location[facility];  Service: Orthopedics;  Laterality: Right;   ORIF WRIST FRACTURE Left 04/09/2014   Procedure: OPEN REDUCTION INTERNAL FIXATION (ORIF) WRIST FRACTURE;  Surgeon: Renette Butters, MD;  Location: [place];  Service: Orthopedics;  Laterality: Left;   PERCUTANEOUS PINNING Left 04/09/2014   Procedure: PERCUTANEOUS PINNING EXTREMITY;  Surgeon: Renette Butters, MD;  Location: [place];  Service: Orthopedics;  Laterality: Left;   SAVORY DILATION N/A 12/13/2017   Procedure: SAVORY DILATION;  Surgeon: Danie Binder, MD;  [facility];  Service: Endoscopy;  Laterality: N/A;   TUBAL LIGATION     vocal cord surgery  02/06/2011   laryngoscopy with bilateral vocal cord Radiesse injection for vocal cord paralysis   YAG LASER APPLICATION Left 91/79/1505   Procedure: YAG LASER APPLICATION;  Surgeon: Rutherford Guys, MD;  [facility];  Service: Ophthalmology;  Laterality: Left;    SOCIAL HISTORY: Social History   Socioeconomic History   Marital status: Divorced    Spouse name: Not on file   Number of children: Not on file   Years of education: Not on file   Highest education level: Not on file  Occupational History   Not on file  Tobacco Use   Smoking status: Former    Packs/day: 0.50    Years: 20.00    Pack years: 10.00    Types: Cigarettes    Quit date: 12/21/2010    Years since quitting: 10.3   Smokeless tobacco: Never   Tobacco comments:    smoking cessation info given and reviewed   Vaping Use   Vaping Use: Never used  Substance and Sexual Activity   Alcohol use: Not Currently    Alcohol/week: 0.0 standard drinks   Drug use: No   Sexual activity: Yes    Birth  control/protection: Surgical  Other Topics Concern   Not on file  Social History Narrative   Divorced since 80.Lives alone with a dog.Marland KitchenRetired.  She receives Meals on Wheels.   Social Determinants of Health   Financial Resource Strain: Low Risk    Difficulty of Paying Living Expenses: Not hard at all  Food Insecurity: No Food Insecurity   Worried About Charity fundraiser in the Last Year: Never true   [place] in the Last Year: Never true  Transportation Needs: No Transportation Needs   Lack of Transportation (Medical): No   Lack of Transportation (Non-Medical): No  Physical Activity: Inactive   Days of Exercise per Week: 0 days   Minutes of Exercise per Session: 0 min  Stress: No Stress Concern Present   Feeling of Stress : Not at all  Social Connections: Socially Isolated   Frequency of Communication with Friends and Family: More than three times a week   Frequency of Social Gatherings with Friends and Family: More than three times a week   Attends Religious Services: Never   Marine scientist or Organizations: No   Attends Music therapist: Never   Marital Status: Divorced  Human resources officer Violence: Not At Risk   Fear of Current or Ex-Partner: No   Emotionally Abused: No   Physically Abused: No   Sexually Abused: No    FAMILY HISTORY: Family History  Problem Relation Age of Onset   Pulmonary embolism Mother    Heart attack Father    Hypertension Father    Liver cancer Sister    Cancer Sister    Cancer Brother    Cancer Daughter    Breast cancer Daughter    Colon cancer Neg Hx     ALLERGIES:  is allergic to ciprofloxacin hcl, sulfonamide derivatives, ciprofloxacin, iohexol, iodinated diagnostic agents, and prozac [fluoxetine hcl].  MEDICATIONS:  Current Outpatient Medications  Medication Sig Dispense Refill   albuterol (VENTOLIN HFA) 108 (90 Base) MCG/ACT inhaler INHALE 2 PUFFS EVERY 6 HOURS AS NEEDED FOR WHEEZING OR SHORTNESS OF  BREATH. 8.5 g 0   ALPRAZolam (XANAX) 0.5 MG tablet TAKE (1) TABLET BY MOUTH TWICE A DAY AS NEEDED. 60 tablet 0   citalopram (CELEXA) 40 MG tablet TAKE (1/2) TABLET BY MOUTH AT BEDTIME. 45 tablet 0   diphenhydrAMINE (BENADRYL) 50 MG tablet Take 1 tablet (50 mg total) by mouth once for 1 dose. Take one hour prior to CT scan 1 tablet 0   ferrous sulfate 325 (65  FE) MG tablet Take 325 mg by mouth daily with breakfast.     fluticasone (FLONASE) 50 MCG/ACT nasal spray Place 2 sprays into both nostrils daily. 16 g 6   Fluticasone-Umeclidin-Vilant (TRELEGY ELLIPTA) 100-62.5-25 MCG/INH AEPB INHALE 1 PUFF INTO LUNGS DAILY. 60 each 6   gabapentin (NEURONTIN) 100 MG capsule TAKE 1 CAPSULE BY MOUTH THREE TIMES A DAY. 90 capsule 6   levocetirizine (XYZAL) 5 MG tablet TAKE ONE TABLET BY MOUTH ONCE DAILY. 30 tablet 6   levothyroxine (SYNTHROID) 50 MCG tablet TAKE 1 TABLET BEFORE BREAKFAST. 90 tablet 0   Multiple Vitamin (MULTIVITAMIN WITH MINERALS) TABS tablet Take 1 tablet by mouth daily.     ondansetron (ZOFRAN) 4 MG tablet TAKE 1 TABLET BY MOUTH 3 TIMES DAILY AS NEEDED FOR NAUSEA AND VOMITING. 90 tablet 0   osimertinib mesylate (TAGRISSO) 80 MG tablet TAKE 1 TABLET (80 MG TOTAL) BY MOUTH DAILY. 30 tablet 2   OVER THE COUNTER MEDICATION Bone Support 2-4 tabs daily     OXcarbazepine (TRILEPTAL) 150 MG tablet TAKE (1) TABLET BY MOUTH TWICE DAILY. 60 tablet 6   pantoprazole (PROTONIX) 40 MG tablet TAKE (1) TABLET BY MOUTH ONCE DAILY BEFORE BREAKFAST. 90 tablet 1   predniSONE (DELTASONE) 50 MG tablet Please take one tablet at 13 hours, 7 hours and one hour prior to CT scan (Patient not taking: Reported on 04/13/2021) 3 tablet 0   Probiotic Product (ALIGN PO) Take by mouth daily.     PROLIA 60 MG/ML SOSY injection INJECT INTO UPPER ARM, THIGH, OR ABDOMEN ONCE. 1 mL 0   No current facility-administered medications for this visit.    REVIEW OF SYSTEMS:   Constitutional: ( - ) fevers, ( - )  chills , ( - ) night  sweats Eyes: ( - ) blurriness of vision, ( - ) double vision, ( - ) watery eyes Ears, nose, mouth, throat, and face: ( - ) mucositis, ( - ) sore throat Respiratory: ( - ) cough, ( - ) dyspnea, ( - ) wheezes Cardiovascular: ( - ) palpitation, ( - ) chest discomfort, ( - ) lower extremity swelling Gastrointestinal:  ( - ) nausea, ( - ) heartburn, ( - ) change in bowel habits Skin: ( - ) abnormal skin rashes Lymphatics: ( - ) new lymphadenopathy, ( - ) easy bruising Neurological: ( - ) numbness, ( - ) tingling, ( - ) new weaknesses Behavioral/Psych: ( - ) mood change, ( - ) new changes  All other systems were reviewed with the patient and are negative.  PHYSICAL EXAMINATION: Telephone Visit  LABORATORY DATA:  I have reviewed the data as listed CBC Latest Ref Rng & Units 03/22/2021 02/20/2021 01/19/2021  WBC 4.0 - 10.5 K/uL 4.9 4.9 4.8  Hemoglobin 12.0 - 15.0 g/dL 10.9(L) 11.8(L) 11.0(L)  Hematocrit 36.0 - 46.0 % 33.3(L) 35.8(L) 33.7(L)  Platelets 150 - 400 K/uL 218 222 207    CMP Latest Ref Rng & Units 03/22/2021 02/20/2021 01/19/2021  Glucose 70 - 99 mg/dL 82 88 87  BUN 8 - 23 mg/dL _0 Creatinine 0.44 - 1.00 mg/dL 0.64 0.77 0.63  Sodium 135 - 145 mmol/L 133(L) 131(L) 133(L)  Potassium 3.5 - 5.1 mmol/L 4.9 4.7 4.8  Chloride 98 - 111 mmol/L 98 95(L) 96(L)  CO2 22 - 32 mmol/L _1 Calcium 8.9 - 10.3 mg/dL 9.3 9.5 9.2  Total Protein 6.5 - 8.1 g/dL 6.7 6.6 6.6  Total Bilirubin 0.3 - 1.2 mg/dL  0.4 0.3 0.3  Alkaline Phos 38 - 126 U/L 41 38 46  AST 15 - 41 U/L _0 ALT 0 - 44 U/L _1 RADIOGRAPHIC STUDIES:  MRI Brain 08/05/2020:   CLINICAL DATA:  Trigeminal neuralgia. Headache, cluster/trigeminal. Brain/CNS neoplasm, assess treatment response. Metastatic lung cancer. Additional history provided: SRS to 5 brain metastases 03/17/2019, left parietal craniotomy for tumor resection 03/18/2019, SRS to 2 additional metastases 01/01/2020.   EXAM: MRI HEAD WITHOUT  AND WITH CONTRAST   MRA HEAD WITHOUT CONTRAST   TECHNIQUE: Multiplanar, multiecho pulse sequences of the brain and surrounding structures were obtained without and with intravenous contrast. Angiographic images of the head were obtained using MRA technique without contrast.   CONTRAST:  83m MULTIHANCE GADOBENATE DIMEGLUMINE 529 MG/ML IV SOLN   COMPARISON:  Prior brain MRI examinations 04/30/2020 and earlier. MRA head 03/04/2019.   FINDINGS: MRI HEAD FINDINGS   Brain:   Stable generalized cerebral atrophy.   Again demonstrated is a hemosiderin stained left parietooccipital resection cavity. Irregular and gyriform enhancement has increased from the prior examination, now measuring up to 12 mm in thickness (for instance as seen on series 14, image 90) (series 15, image 9). This irregular enhancement extends to the site of a previously demonstrated 3 mm nodular focus of enhancement within the superolateral aspect of the cavity. Surrounding T2/FLAIR hyperintense signal abnormality has not significantly changed.   A previously demonstrated 4 mm lesion within the medial right cerebellum is no longer appreciated.   A 4 mm enhancing lesion within the left cerebellar hemisphere was present on the prior examination but seen to better advantage on today's study (series 14, image 48).   New 2 mm enhancing lesion more posteriorly within the medial left cerebellar hemisphere (series 14, image 50).   New 2 mm enhancing lesion within the lateral left cerebellum (series 14, image 42).   A 3 mm lesion within the high posterior right frontal lobe has decreased in size (series 14, image 145) (previously 5 mm).   A previously demonstrated 2 mm enhancing lesion within the left frontal lobe has increased in size, now measuring 8 mm (series 14, image 125) (series 11, image 39) . The lesion now demonstrates central T2/FLAIR hyperintensity and T1 hyperintensity with a peripheral low signal  rim. Precontrast T1 hyperintensity limits evaluation for enhancement at this site.   Multifocal T2/FLAIR hyperintensity elsewhere within the cerebral white matter and within the pons is nonspecific, but compatible chronic small vessel ischemic disease.   No evidence of acute infarction.   No extra-axial fluid collection.   No midline shift.   Vascular: Expected proximal arterial flow voids.   Skull and upper cervical spine: No focal suspicious marrow lesion.   Sinuses/Orbits: Visualized orbits show no acute finding. Trace ethmoid sinus mucosal thickening. No significant mastoid effusion.   MRA HEAD FINDINGS   The intracranial internal carotid arteries are patent. The M1 middle cerebral arteries are patent without significant stenosis. No M2 proximal branch occlusion or high-grade proximal stenosis is identified. The anterior cerebral arteries are patent. 1-2 mm inferiorly projecting vascular protrusion arising from the supraclinoid right ICA which may reflect an infundibulum or small aneurysm (series 103, image 12).   The intracranial vertebral arteries are patent. The basilar artery is patent. The posterior cerebral arteries are patent.   IMPRESSION: MRI brain:   1. Irregular and gyriform enhancement at site of the left parietooccipital resection cavity has increased, now measuring up to 12 mm  in thickness. This enhancement extends to the site of a previously demonstrated 3 mm nodular enhancing focus within the superolateral aspect of the cavity. Findings may reflect post-treatment inflammation or recurrent tumor. Short interval MRI follow-up is recommended. Surrounding T2 hyperintense signal abnormality has not appreciably changed. 2. New 2 mm metastasis within the posteromedial left cerebellar hemisphere. 3. New 2 mm metastasis within the lateral left cerebellum. 4. An additional 4 mm metastasis within the left cerebellum was present on the prior MRI of 04/30/2020,  but is seen to better advantage on today's study. 5. A previously demonstrated 4 mm metastasis within the medial right cerebellum is no longer appreciated 6. 8 mm metastasis within the paramedian left frontal lobe, increased in size and now demonstrating precontrast T1 hyperintensity which limits evaluation for enhancement. 7. A 3 mm lesion within the high posterior right frontal lobe has decreased in size.   MRA head:   1. No intracranial large vessel occlusion or proximal high-grade arterial stenosis. 2. 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     Electronically Signed   By: Kellie Simmering DO   On: 08/05/2020 13:49  No results found.   ASSESSMENT & PLAN [name] 80 y.o. female with medical history significant for metastatic adenocarcinoma of the lung with brain metastasis who presents for a follow up visit.  After review the records, review of the labs, review the imaging, discussion with the patient the findings are most consistent with a stage IV adenocarcinoma of the lung with a small lung lesion and metastatic spread to the brain.  Her metastatic brain mets have been treated with resection of tumor on 03/18/2019 as well as SRS on 12/12/2019.  Most recently she was found to have 2 new subcentimeter lesions and underwent stereotactic radiation therapy which was completed on 05/20/2020. Unfortunately on 08/05/2020 she was noted to have new CNS metastatic disease.   There are limitations in what therapies can be used for this patient given her poor functional status.  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  After extensive discussions with the patient's about the risks and benefits of this treatment she was agreeable to starting osimertinib 80 mg p.o. daily for her metastatic adenocarcinoma of the lung. She started this treatment on 09/05/2020.  On discussion today [name] is tolerating  Tagrisso well and is willing and able to proceed at this time.  Restaging CT scans are planned for August 2022.   #Stage IV ([id number]) adenocarcinoma of the lung: -Left lower lobe resection on 12/21/2010, 14 lymph nodes negative, 2.2 cm tumor size, margins negative. -most recent MRI brain scan shows stable disease. No evidence of Chest/abdomen involvement on scan from 02/14/2021.  --started therapy with osimertinib 80 mg PO daily for her EGFR Exon 19 mutation. She started on 09/05/2020 -CT chest with contrast on 02/14/2021 showed no active disease in the chest. Continue CT scans q 3 months.  -PD-L1 testing was 1%. -- Patient is a poor candidate for systemic therapy, though she does have an EGFR Exon 19 mutation.  --RTC in 4 weeks time to assure continued tolerance of osimirtenib.   #Hand Foot Syndrome, resolved --previously noted to have desquamation and erythema of finger tips. Resolved on exam today.  --held therapy for 1-2 weeks until resolution --patient had resolution of symptoms after 2 weeks off therapy. Encouraged continue aggressive moisturizing lotion.    #Brain metastasis, stable -Presentation with  expressive aphasia to ER on 03/03/2019 along with right-sided weakness.  Found to have multiple brain metastasis and underwent SRS on 03/17/2019. 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 -Underwent SRS on 01/01/2020 for 2 subcentimeter brain lesions. -Brain MRI on 04/30/2020 showed 2 new subcentimeter meta stasis in the right cerebellum and right frontal lobe. -Stereotactic radiation therapy was completed on 05/20/2020 --most recent MRI on 02/03/2021 showed stable disease. Continue systemic therapy as above.   No orders of the defined types were placed in this encounter.   All questions were answered. The patient knows to call the clinic with any problems, questions or concerns.  A total of more than 30  minutes were spent on this encounter and over half of that time was spent on counseling and coordination of care as outlined above.   Ledell Peoples, MD Department of Hematology/Oncology [place] at [facility] Phone: [phone] Pager: [phone] Email: [email or web address]  04/17/2021 7:26 AM    Suszanne Conners HA, Su JM, [place] SH, Rowe Pavy, Dorrance MJ. 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 J Thorac Oncol. 2020 Nov;15(11):1758-1766. doi: 10.1016/j.jtho.2020.06.018. Epub 2020 Jul 9. PMID: [id number].  --Osimertinib is a promising treatment option for EGFR-mutated NSCLC with LM regardless of T790M mutational status.

## 2021-04-20 ENCOUNTER — Telehealth: Payer: Self-pay | Admitting: Family Medicine

## 2021-04-20 NOTE — Telephone Encounter (Signed)
Pt called stating the she needs a letter to be written to [place] from Dr. Dennard Schaumann about her use of oxygen for life support in case of a power outage. Please advise  Cb#: [phone]

## 2021-04-28 IMAGING — MR MR HEAD WO/W CM
9 of 12 series · 24 of 48 positions shown · IV contrast (cc gad)
Comparison: 03/19/2019.  03/09/2019.

CLINICAL DATA: Follow-up metastatic lung cancer. Resection [place] of
this year. Radiation subsequent.

EXAM:
MRI HEAD WITHOUT AND WITH CONTRAST
TECHNIQUE: Multiplanar, multiecho pulse sequences of the brain and surrounding
structures were obtained without and with intravenous contrast.
CONTRAST:  5mL GADAVIST GADOBUTROL 1 MMOL/ML IV SOLN

[Series 2: DWI · axial · 3.0mm · 0.94mm/px · z∈[-101,+69]mm · 5 of 116 slices shown]
[im 1/116]
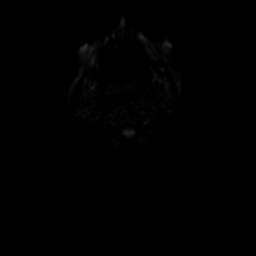
[im 29/116]
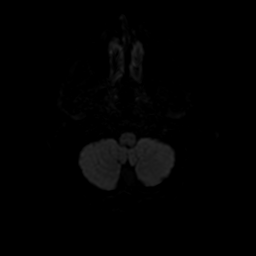
[im 58/116]
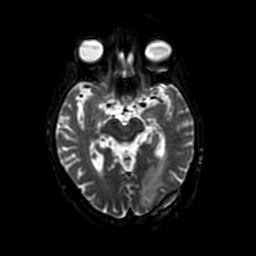
[im 87/116]
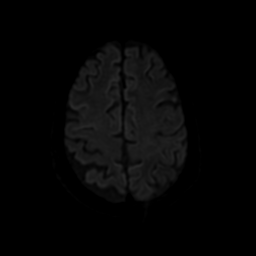
[im 116/116]
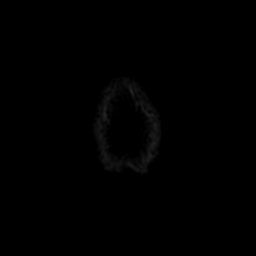

[Series 3: FLAIR · axial · 3.0mm · 0.47mm/px · z∈[-109,+68]mm · 3 of 60 slices shown (1 of 2)]
[im 1/60]
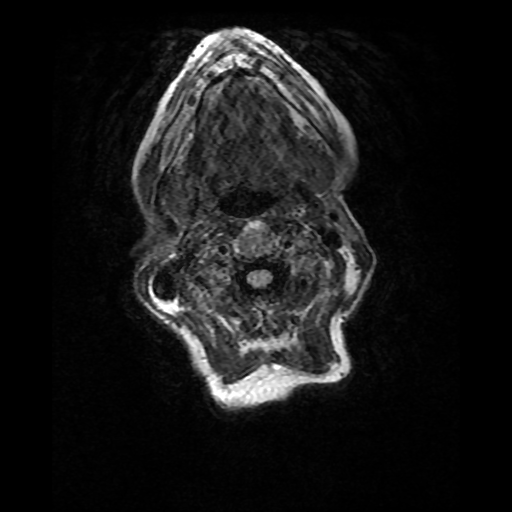
[im 30/60]
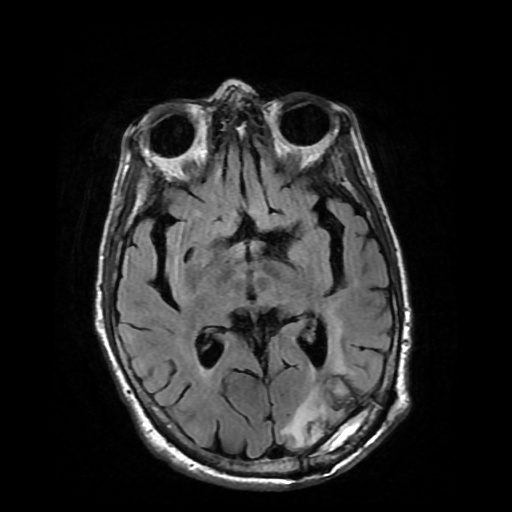
[im 60/60]
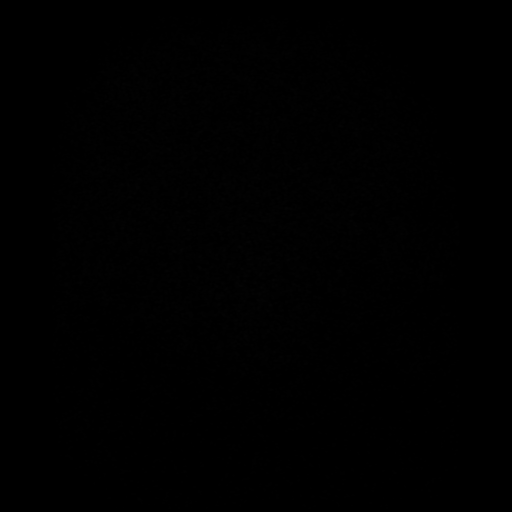

[Series 4: FLAIR · sagittal · 3.0mm · 0.47mm/px · 2 of 36 slices shown (2 of 2)]
[im 1/36]
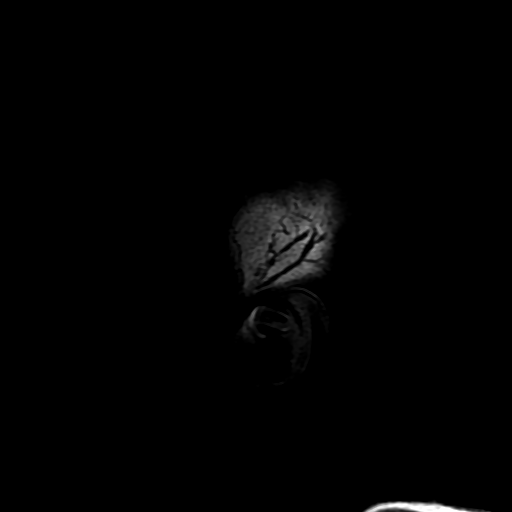
[im 36/36]
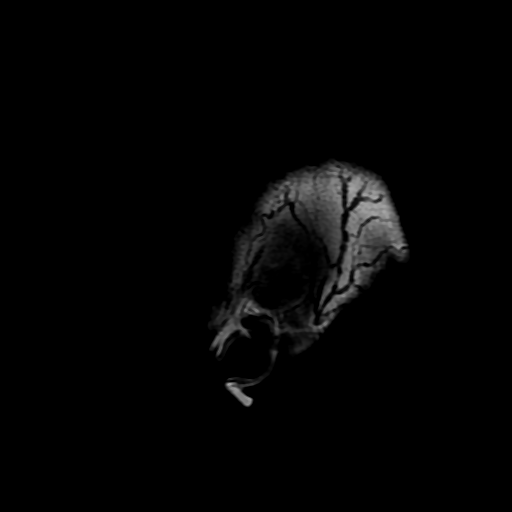

[Series 5: T2 · axial · 5.0mm · 0.47mm/px · 1 of 29 slices shown]
[im 1/29]
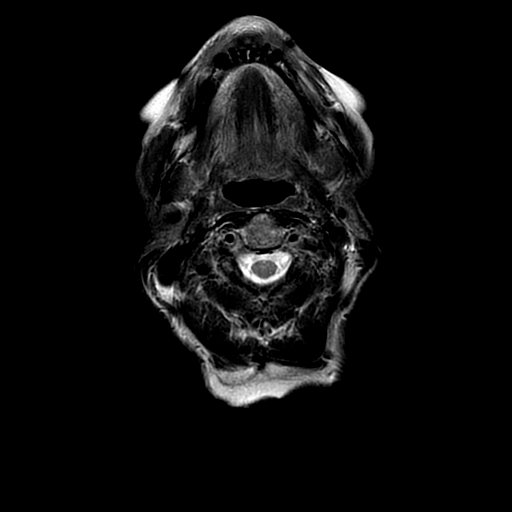

[Series 6: SWI · axial · 3.0mm · 0.47mm/px · z∈[-99,+4]mm · 4 of 116 slices shown]
[im 1/116]
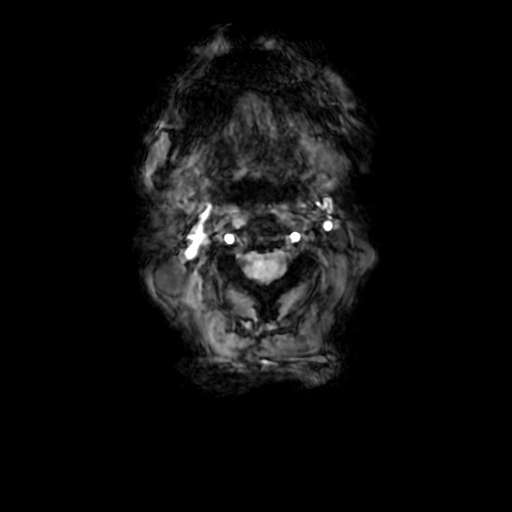
[im 24/116]
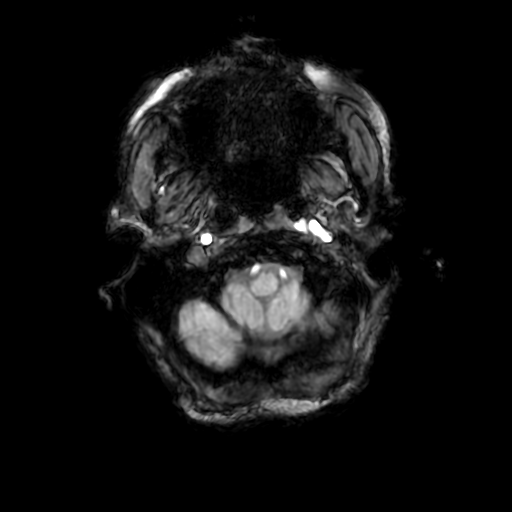
[im 47/116]
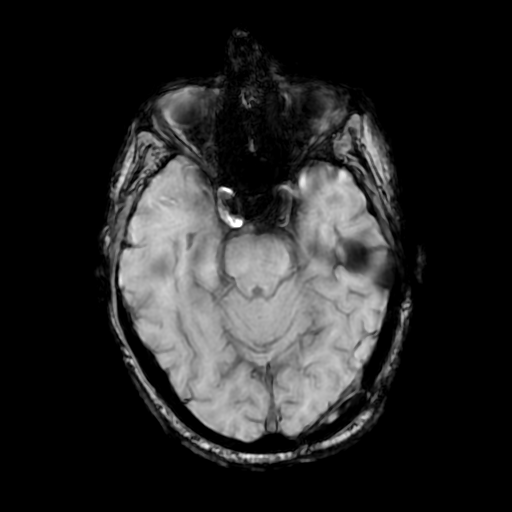
[im 70/116]
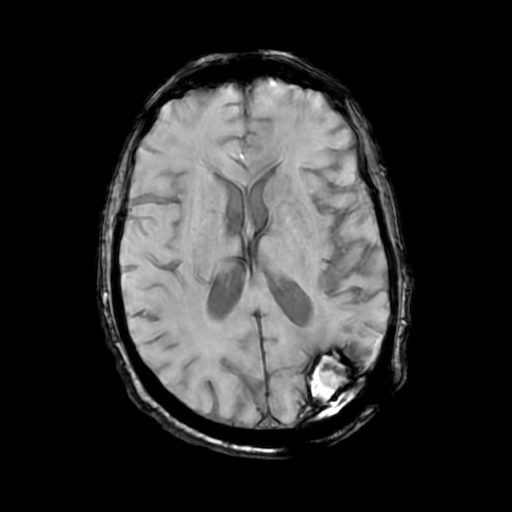

[Series 8: T2 post-contrast · coronal · 3.0mm · 0.39mm/px · 2 of 49 slices shown]
[im 1/49]
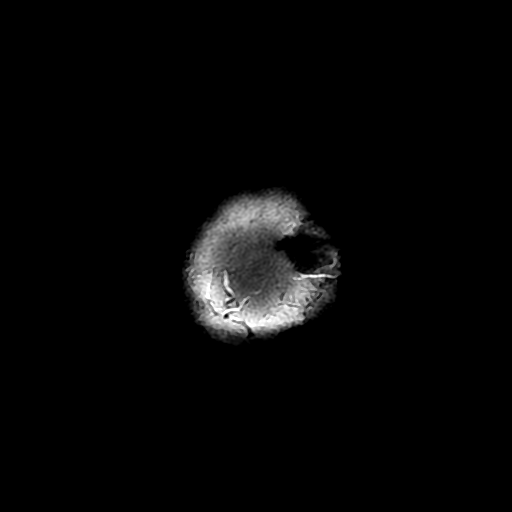
[im 49/49]
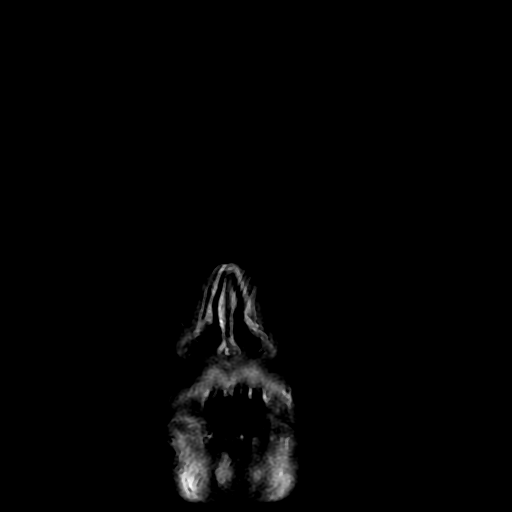

[Series 10: T1 post-contrast · coronal · 3.0mm · 0.43mm/px · 2 of 49 slices shown]
[im 1/49]
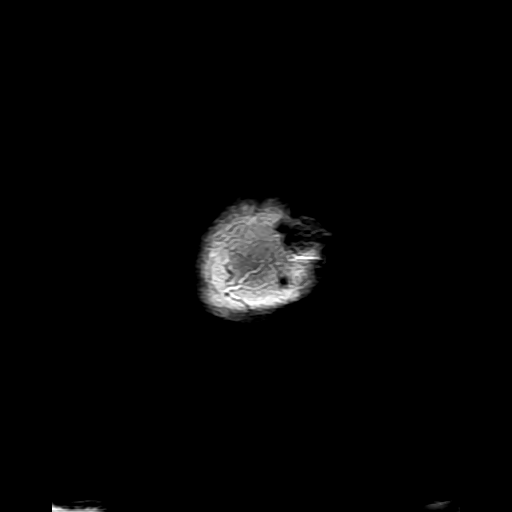
[im 49/49]
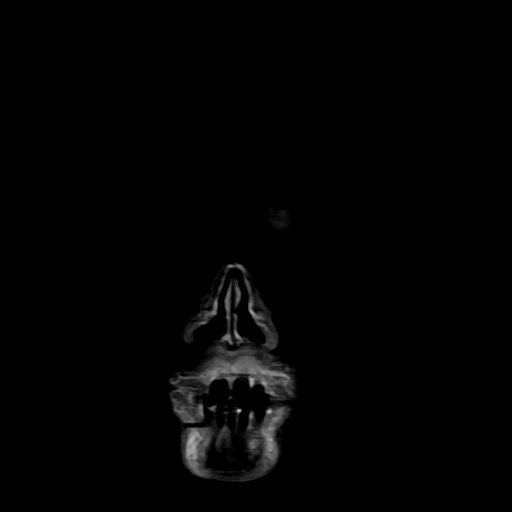

[Series 11: FLAIR post-contrast · sagittal · 3.0mm · 0.47mm/px · 2 of 36 slices shown]
[im 1/36]
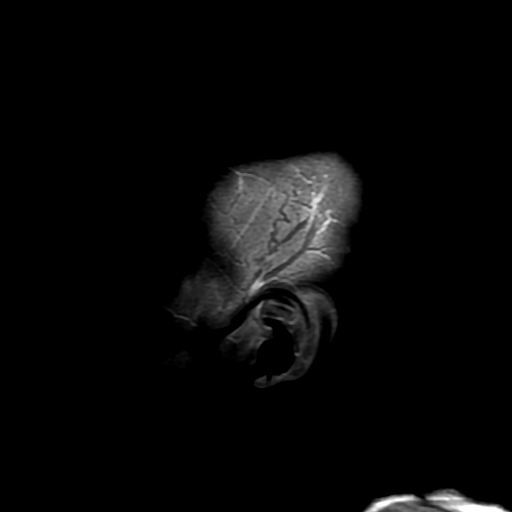
[im 36/36]
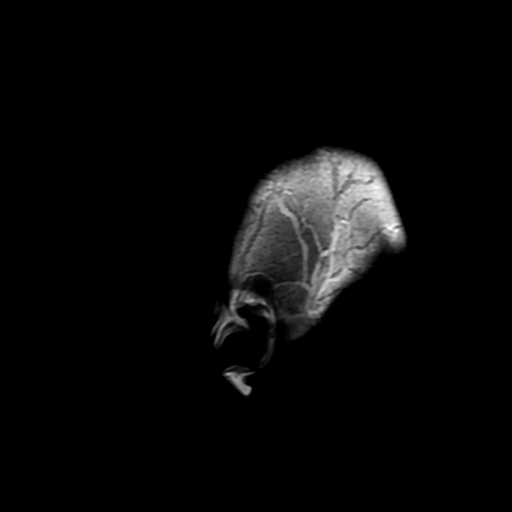

[Series 250: ADC · axial · 3.0mm · 0.94mm/px · z∈[-101,+69]mm · 3 of 57 slices shown]
[im 1/57]
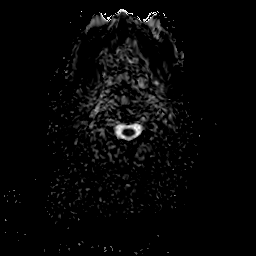
[im 29/57]
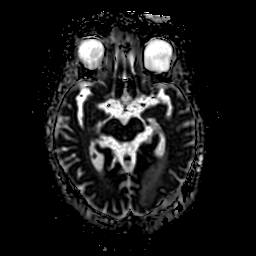
[im 57/57]
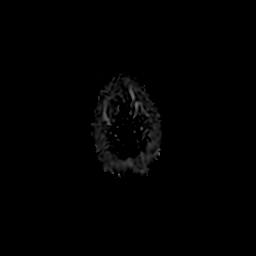

[24 of 48 positions shown; findings below may reference images not displayed]

FINDINGS: Brain: Marginal enhancement along the ages of the post resection
cavity in the left posterior parietal lobe are present. This is
fairly thin enhancement rather than nodular enhancement. I think
this is inconclusive for the potential residual tumor, an additional
follow-up will be necessary. Regional edema and mass effect is
diminished but not completely resolved.

Other previously treated lesions in the left cerebellum, inferior
right temporal lobe, deep insular region on the left, and left
central sulcus region have been successfully treated and are no
longer visible.

No new lesion.

No hydrocephalus.  No sign of recent ischemic infarction.

Vascular: Major vessels at the base of the brain show flow.

Skull and upper cervical spine: Craniotomy changes. Otherwise
negative.

Sinuses/Orbits: Clear/normal

Other: None
IMPRESSION: Marginal enhancement of the resection cavity in the left posterior
parietal region. Finding is indeterminate at this point, and further
follow-up would obviously be useful. There is less edema and mass
effect in the surgical region.

No longer any enhancement or mass at the other treated lesions
including the left cerebellum, inferior right temporal lobe, deep
insula on the left and left central sulcus region.

## 2021-04-29 ENCOUNTER — Other Ambulatory Visit: Payer: Self-pay | Admitting: Nurse Practitioner

## 2021-05-01 DIAGNOSIS — J9621 Acute and chronic respiratory failure with hypoxia: Secondary | ICD-10-CM | POA: Diagnosis not present

## 2021-05-01 DIAGNOSIS — J449 Chronic obstructive pulmonary disease, unspecified: Secondary | ICD-10-CM | POA: Diagnosis not present

## 2021-05-01 DIAGNOSIS — R0902 Hypoxemia: Secondary | ICD-10-CM | POA: Diagnosis not present

## 2021-05-01 DIAGNOSIS — C3490 Malignant neoplasm of unspecified part of unspecified bronchus or lung: Secondary | ICD-10-CM | POA: Diagnosis not present

## 2021-05-01 NOTE — Telephone Encounter (Signed)
Ok to refill??  Last office visit 07/02/2020.  Last refill 04/04/2021.

## 2021-05-04 ENCOUNTER — Telehealth: Payer: Self-pay

## 2021-05-04 ENCOUNTER — Other Ambulatory Visit (HOSPITAL_COMMUNITY): Payer: Self-pay

## 2021-05-04 MED ORDER — ONDANSETRON HCL 4 MG PO TABS: ORAL_TABLET | ORAL | 0 refills | Status: DC

## 2021-05-04 NOTE — Telephone Encounter (Signed)
Pt called in needing a refill of ondansetron (ZOFRAN) 4 MG tablet, sent to pharmacy. Please call  Cb#: [phone]

## 2021-05-05 ENCOUNTER — Other Ambulatory Visit: Payer: Self-pay | Admitting: Family Medicine

## 2021-05-05 ENCOUNTER — Other Ambulatory Visit (HOSPITAL_COMMUNITY): Payer: Self-pay

## 2021-05-05 MED ORDER — ONDANSETRON HCL 4 MG PO TABS
ORAL_TABLET | ORAL | 0 refills | Status: DC
  Filled 2021-05-05: qty 90, 30d supply, fill #0

## 2021-05-11 ENCOUNTER — Telehealth: Payer: Self-pay

## 2021-05-11 ENCOUNTER — Other Ambulatory Visit (HOSPITAL_COMMUNITY): Payer: Self-pay

## 2021-05-11 NOTE — Telephone Encounter (Signed)
Pt called and wanted to request a referral be faxed to [facility] for oxygen. 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 Please advise.  [place] Fax number [phone]  Cb#: [phone]

## 2021-05-11 NOTE — Telephone Encounter (Signed)
Call placed to patient to inquire.   Reports that she is on continuous O2 at 2L/min via [facility]. She currently has a stationary oxygen concentrator with a home fill system for portable tanks. Reports that tanks are bulky and hard to transport. Requested order for portable oxygen concentrator.   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 States that she is currently using Mucinex, Trelegy, albuterol and benadryl as needed. Inquired if there is another medication she can take at this time.

## 2021-05-12 ENCOUNTER — Ambulatory Visit
Admission: RE | Admit: 2021-05-12 | Discharge: 2021-05-12 | Disposition: A | Discharge status: Home / Self Care | Payer: PPO | Source: Ambulatory Visit | Attending: Internal Medicine | Admitting: Internal Medicine

## 2021-05-12 DIAGNOSIS — Z9889 Other specified postprocedural states: Secondary | ICD-10-CM | POA: Diagnosis not present

## 2021-05-12 DIAGNOSIS — Q283 Other malformations of cerebral vessels: Secondary | ICD-10-CM | POA: Diagnosis not present

## 2021-05-12 DIAGNOSIS — C7931 Secondary malignant neoplasm of brain: Secondary | ICD-10-CM

## 2021-05-12 DIAGNOSIS — C3490 Malignant neoplasm of unspecified part of unspecified bronchus or lung: Secondary | ICD-10-CM | POA: Diagnosis not present

## 2021-05-12 MED ORDER — GADOBENATE DIMEGLUMINE 529 MG/ML IV SOLN
10.0000 mL | Freq: Once | INTRAVENOUS | Status: AC | PRN
  Administered 2021-05-12: 10 mL via INTRAVENOUS

## 2021-05-12 NOTE — Telephone Encounter (Signed)
Patient aware per MyChart.   Order faxed to [facility]

## 2021-05-15 ENCOUNTER — Other Ambulatory Visit: Payer: Self-pay | Admitting: *Deleted

## 2021-05-15 ENCOUNTER — Inpatient Hospital Stay: Payer: PPO | Attending: Internal Medicine | Admitting: Hematology and Oncology

## 2021-05-15 ENCOUNTER — Inpatient Hospital Stay: Payer: PPO

## 2021-05-15 ENCOUNTER — Other Ambulatory Visit: Payer: Self-pay

## 2021-05-15 VITALS — BP 107/65 | HR 69 | Temp 97.8°F | Resp 18 | Ht 60.0 in | Wt 108.2 lb

## 2021-05-15 DIAGNOSIS — C3492 Malignant neoplasm of unspecified part of left bronchus or lung: Secondary | ICD-10-CM

## 2021-05-15 DIAGNOSIS — C7931 Secondary malignant neoplasm of brain: Secondary | ICD-10-CM | POA: Insufficient documentation

## 2021-05-15 DIAGNOSIS — C3432 Malignant neoplasm of lower lobe, left bronchus or lung: Secondary | ICD-10-CM | POA: Diagnosis not present

## 2021-05-15 LAB — CMP (CANCER CENTER ONLY)
ALT: 12 U/L (ref 0–44)
AST: 17 U/L (ref 15–41)
Albumin: 3.9 g/dL (ref 3.5–5.0)
Alkaline Phosphatase: 40 U/L (ref 38–126)
Anion gap: 8 (ref 5–15)
BUN: 13 mg/dL (ref 8–23)
CO2: 28 mmol/L (ref 22–32)
Calcium: 9.6 mg/dL (ref 8.9–10.3)
Chloride: 94 mmol/L — ABNORMAL LOW (ref 98–111)
Creatinine: 0.64 mg/dL (ref 0.44–1.00)
GFR, Estimated: >60 mL/min (ref >60)
Glucose, Bld: 79 mg/dL (ref 70–99)
Potassium: 4.6 mmol/L (ref 3.5–5.1)
Sodium: 130 mmol/L — ABNORMAL LOW (ref 135–145)
Total Bilirubin: 0.4 mg/dL (ref 0.3–1.2)
Total Protein: 6.2 g/dL — ABNORMAL LOW (ref 6.5–8.1)

## 2021-05-15 LAB — CBC WITH DIFFERENTIAL (CANCER CENTER ONLY)
Abs Immature Granulocytes: 0.01 10*3/uL (ref 0.00–0.07)
Basophils Absolute: 0 10*3/uL (ref 0.0–0.1)
Basophils Relative: 0 %
Eosinophils Absolute: 0.1 10*3/uL (ref 0.0–0.5)
Eosinophils Relative: 1 %
HCT: 35.2 % — ABNORMAL LOW (ref 36.0–46.0)
Hemoglobin: 11.8 g/dL — ABNORMAL LOW (ref 12.0–15.0)
Immature Granulocytes: 0 %
Lymphocytes Relative: 11 %
Lymphs Abs: 0.6 10*3/uL — ABNORMAL LOW (ref 0.7–4.0)
MCH: 29.4 pg (ref 26.0–34.0)
MCHC: 33.5 g/dL (ref 30.0–36.0)
MCV: 87.8 fL (ref 80.0–100.0)
Monocytes Absolute: 0.4 10*3/uL (ref 0.1–1.0)
Monocytes Relative: 7 %
Neutro Abs: 4.3 10*3/uL (ref 1.7–7.7)
Neutrophils Relative %: 81 %
Platelet Count: 246 10*3/uL (ref 150–400)
RBC: 4.01 MIL/uL (ref 3.87–5.11)
RDW: 12.1 % (ref 11.5–15.5)
WBC Count: 5.4 10*3/uL (ref 4.0–10.5)
nRBC: 0 % (ref 0.0–0.2)

## 2021-05-15 MED ORDER — PREDNISONE 50 MG PO TABS: ORAL_TABLET | ORAL | 0 refills | Status: DC

## 2021-05-15 NOTE — Progress Notes (Signed)
CT scan scheduled @ [name] on 05/17/21 @ 1pm.  Called and spoke with pt about this. Also advised that the prednisone she takes prior to scan has been sent to her pharmacy  She knows to take it 13 hours, 7 hours and 1 before her scan, along with Benadryl 50 mg 1 hour before scan. They will pick up moral contrast on their way home today @ [facility].

## 2021-05-15 NOTE — Progress Notes (Signed)
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 0/14 lymph nodes.  No perineural invasion or LVI.  negative margins 2) 03/03/2019: presented to ED with expressive aphasia, found to have multiple brain metastasis and underwent SRS on 03/17/2019. 3) 03/18/2019: Stereotactic left parietal craniotomy for resection of tumor 4) 04/30/2020: Brain MRI on showed 2 new subcentimeter meta stasis in the right cerebellum and right frontal lobe. 5)  05/20/2020: Stereotactic radiation therapy was completed 6) 06/23/2020: establish care with Dr. [name]  7) 08/29/2020: prescribed osimirtinib therapy due to next metastatic disease in the brain.  8) 09/05/2020: started osimirtinib 13m PO daily 9) 10/19/2020: CT Chest showed no evidence of residual disease 10) 11/21/2020: desquamation and erythema of finger tips. Concern for HFS. Temporarily holding therapy  11) 12/05/2020: restarted Tagrisso after resolution of the HFS symptoms  Interval History:  [name] 80y.o. female with medical history significant for metastatic adenocarcinoma of the lung with brain metastasis who presents for a follow up visit. The patient's last visit was on 04/17/2021.  In the interim since the last visit the patient has continued on her Tagrisso with no major isuses.   On exam today [name] reports she feels well.  She notes that her appetite has been good and energy has  been overall strong.  She notes that she is concerned about some new varicose veins which have developed on the back of her lower extremities.  She does have some "occasional queasiness" but otherwise does not notice any side effects from the TBrandermilltherapy.  She is at her baseline level of 2 L of oxygen.  She otherwise has been quite well and is willing and able to proceed with osimertinib therapy.  She currently denies any fevers, chills, sweats, nausea vomiting or diarrhea.  A full ten point ROS is listed below.  MEDICAL HISTORY:  Past Medical History:  Diagnosis Date   Adenocarcinoma of lung ([place] 12/2010   left lung/surg only   Allergic rhinitis    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[facility]    corrected by surgery 08/21/13   Anxiety disorder    Aortic aneurysm ([facility]    Brain metastasis ([place]    lung cancer s/p resection 7/20   Cancer ([place]    Phreesia 037/90/2409  Complication of anesthesia 2011   bloodpressure dropped during colonoscopy, not problems since   Compression fracture    COPD (chronic obstructive pulmonary disease) (HCC)    Depression    Diastolic dysfunction    Diverticulosis    Dysphagia    Emphysema lung ([place] 11/08/2014   Headache    Hiatal hernia    History of kidney stones    Hypothyroidism    IBS (irritable bowel syndrome)    Iron deficiency anemia due to chronic blood loss 12/05/2016   On home O2 12/15/2013   chronic hypoxia   Pneumonia    PUD (peptic ulcer disease)    89yr  Shortness of breath    with exertion   SIADH (syndrome of inappropriate ADH production) ([place]  Trigeminal neuralgia     SURGICAL HISTORY: Past Surgical History:  Procedure Laterality Date   ABDOMINAL HYSTERECTOMY     APPLICATION OF CRANIAL NAVIGATION N/A 03/18/2019   Procedure: APPLICATION OF CRANIAL NAVIGATION;  Surgeon: Consuella Lose, MD;  Location: [place];  Service: Neurosurgery;  Laterality: N/A;   BACK SURGERY  January 13, 2014   BRAIN SURGERY N/A    Phreesia 03/28/2020    CATARACT EXTRACTION     CATARACT EXTRACTION W/PHACO  09/02/2012   Procedure: CATARACT EXTRACTION PHACO AND INTRAOCULAR LENS PLACEMENT (IOC);  Surgeon: Elta Guadeloupe T. 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 Oneida Alar: moderate diverticulosis, hemorrhoids, redundant colon. next TCS in 10-15 years.    CRANIOTOMY Left 03/18/2019   Procedure: Left stereotactic craniotomy for tumor resection;  Surgeon: Consuella Lose, MD;  Location: [place];  Service: Neurosurgery;  Laterality: Left;  Left stereotactic craniotomy for tumor resection   ENDARTERECTOMY Right 08/21/2013   Procedure: RIGHT CAROTID ANEURYSM RESECTION;  Surgeon: Rosetta Posner, MD;  Location: [place];  Service: Vascular;  Laterality: Right;   ESOPHAGEAL DILATION  02/24/2018   Procedure: ESOPHAGEAL DILATION;  Surgeon: Danie Binder, MD;  [facility];  Service: Endoscopy;;   ESOPHAGOGASTRODUODENOSCOPY  11/13/2009   w/dilation to 53mm, gastric ulceration (H.Pylori) s/p treatment   ESOPHAGOGASTRODUODENOSCOPY  11/21/2009   distal esophageal web, gastritis   ESOPHAGOGASTRODUODENOSCOPY  03/14/12   IDP:OEUMPNTIR in the distal esophagus/Mild gastritis/small HH. + H.pylori, prescribed Pylera. Finished treatment.    ESOPHAGOGASTRODUODENOSCOPY N/A 12/13/2017   Dr. Oneida Alar: esophageal web s/p dilation, gastritis, no h.pylori   ESOPHAGOGASTRODUODENOSCOPY N/A 02/24/2018   Dr. Oneida Alar: web in prox esophagus and in distal esophagus s/p dilation, mild gastritis, mild pyloric stenosis, mild post ulcer duodenal deformity at D1/D2   fatty tumor removal from lt groin     FRACTURE SURGERY Left    hand and left ring finger   GIVENS CAPSULE STUDY N/A 12/26/2017   incomplete   GIVENS CAPSULE STUDY N/A 02/24/2018   normal small bowel   KNEE SURGERY     patella tendon repair june 2019 [facility]    LUNG CANCER SURGERY  12/2010   Left VATS, minithoracotomy, LLL superior segmentectomy   ORIF PATELLA Right 03/18/2018   Procedure: OPEN REDUCTION INTERNAL (ORIF) FIXATION RIGHT PATELLA;  Surgeon: Carole Civil, MD;  [facility];  Service: Orthopedics;  Laterality: Right;   ORIF WRIST FRACTURE Left 04/09/2014   Procedure: OPEN REDUCTION INTERNAL FIXATION (ORIF) WRIST FRACTURE;  Surgeon: Renette Butters, MD;  Location: [facility];  Service: Orthopedics;  Laterality: Left;   PERCUTANEOUS PINNING Left 04/09/2014   Procedure: PERCUTANEOUS PINNING EXTREMITY;  Surgeon: Renette Butters, MD;  Location: [place];  Service: Orthopedics;  Laterality: Left;   SAVORY DILATION N/A 12/13/2017   Procedure: SAVORY DILATION;  Surgeon: Danie Binder, MD;  [facility];  Service: Endoscopy;  Laterality: N/A;   TUBAL LIGATION     vocal cord surgery  02/06/2011   laryngoscopy with bilateral vocal cord Radiesse injection for vocal cord paralysis   YAG LASER APPLICATION Left 44/31/5400   Procedure: YAG LASER APPLICATION;  Surgeon: Rutherford Guys, MD;  [facility];  Service: Ophthalmology;  Laterality: Left;    SOCIAL HISTORY: Social History   Socioeconomic History   Marital status: Divorced  Spouse name: Not on file   Number of children: Not on file   Years of education: Not on file   Highest education level: Not on file  Occupational History   Not on file  Tobacco Use   Smoking status: Former    Packs/day: 0.50    Years: 20.00    Pack years: 10.00    Types: Cigarettes    Quit date: 12/21/2010    Years since quitting: 10.4   Smokeless tobacco: Never   Tobacco comments:    smoking cessation info given and reviewed   Vaping Use   Vaping Use: Never used  Substance and Sexual Activity   Alcohol use: Not Currently    Alcohol/week: 0.0 standard drinks   Drug use: No   Sexual activity: Yes    Birth control/protection: Surgical  Other Topics Concern   Not on file  Social History  Narrative   Divorced since 69.Lives alone with a dog.Marland KitchenRetired.  She receives Meals on Wheels.   Social Determinants of Health   Financial Resource Strain: Low Risk    Difficulty of Paying Living Expenses: Not hard at all  Food Insecurity: No Food Insecurity   Worried About Charity fundraiser in the Last Year: Never true   Real in the Last Year: Never true  Transportation Needs: No Transportation Needs   Lack of Transportation (Medical): No   Lack of Transportation (Non-Medical): No  Physical Activity: Inactive   Days of Exercise per Week: 0 days   Minutes of Exercise per Session: 0 min  Stress: No Stress Concern Present   Feeling of Stress : Not at all  Social Connections: Socially Isolated   Frequency of Communication with Friends and Family: More than three times a week   Frequency of Social Gatherings with Friends and Family: More than three times a week   Attends Religious Services: Never   Marine scientist or Organizations: No   Attends Music therapist: Never   Marital Status: Divorced  Human resources officer Violence: Not At Risk   Fear of Current or Ex-Partner: No   Emotionally Abused: No   Physically Abused: No   Sexually Abused: No    FAMILY HISTORY: Family History  Problem Relation Age of Onset   Pulmonary embolism Mother    Heart attack Father    Hypertension Father    Liver cancer Sister    Cancer Sister    Cancer Brother    Cancer Daughter    Breast cancer Daughter    Colon cancer Neg Hx     ALLERGIES:  is allergic to ciprofloxacin hcl, sulfonamide derivatives, ciprofloxacin, iohexol, iodinated diagnostic agents, and prozac [fluoxetine hcl].  MEDICATIONS:  Current Outpatient Medications  Medication Sig Dispense Refill   ALPRAZolam (XANAX) 0.5 MG tablet TAKE (1) TABLET BY MOUTH TWICE A DAY AS NEEDED. 60 tablet 0   albuterol (VENTOLIN HFA) 108 (90 Base) MCG/ACT inhaler INHALE 2 PUFFS EVERY 6 HOURS AS NEEDED FOR WHEEZING OR  SHORTNESS OF BREATH. 8.5 g 0   citalopram (CELEXA) 40 MG tablet TAKE (1/2) TABLET BY MOUTH AT BEDTIME. 45 tablet 0   diphenhydrAMINE (BENADRYL) 50 MG tablet Take 1 tablet (50 mg total) by mouth once for 1 dose. Take one hour prior to CT scan 1 tablet 0   ferrous sulfate 325 (65 FE) MG tablet Take 325 mg by mouth daily with breakfast.     fluticasone (FLONASE) 50 MCG/ACT nasal spray Place 2 sprays into both  nostrils daily. 16 g 6   Fluticasone-Umeclidin-Vilant (TRELEGY ELLIPTA) 100-62.5-25 MCG/INH AEPB INHALE 1 PUFF INTO LUNGS DAILY. 60 each 6   gabapentin (NEURONTIN) 100 MG capsule TAKE 1 CAPSULE BY MOUTH THREE TIMES A DAY. 90 capsule 6   levocetirizine (XYZAL) 5 MG tablet TAKE ONE TABLET BY MOUTH ONCE DAILY. 30 tablet 6   levothyroxine (SYNTHROID) 50 MCG tablet TAKE 1 TABLET BEFORE BREAKFAST. 90 tablet 0   Multiple Vitamin (MULTIVITAMIN WITH MINERALS) TABS tablet Take 1 tablet by mouth daily.     ondansetron (ZOFRAN) 4 MG tablet TAKE 1 TABLET BY MOUTH 3 TIMES DAILY AS NEEDED FOR NAUSEA AND VOMITING. 90 tablet 0   osimertinib mesylate (TAGRISSO) 80 MG tablet TAKE 1 TABLET (80 MG TOTAL) BY MOUTH DAILY. 30 tablet 2   OVER THE COUNTER MEDICATION Bone Support 2-4 tabs daily     OXcarbazepine (TRILEPTAL) 150 MG tablet TAKE (1) TABLET BY MOUTH TWICE DAILY. 60 tablet 6   pantoprazole (PROTONIX) 40 MG tablet TAKE (1) TABLET BY MOUTH ONCE DAILY BEFORE BREAKFAST. 90 tablet 1   predniSONE (DELTASONE) 50 MG tablet Please take one tablet at 13 hours, 7 hours and one hour prior to CT scan 3 tablet 0   Probiotic Product (ALIGN PO) Take by mouth daily.     PROLIA 60 MG/ML SOSY injection INJECT INTO UPPER ARM, THIGH, OR ABDOMEN ONCE. 1 mL 0   No current facility-administered medications for this visit.    REVIEW OF SYSTEMS:   Constitutional: ( - ) fevers, ( - )  chills , ( - ) night sweats Eyes: ( - ) blurriness of vision, ( - ) double vision, ( - ) watery eyes Ears, nose, mouth, throat, and face: ( - )  mucositis, ( - ) sore throat Respiratory: ( - ) cough, ( - ) dyspnea, ( - ) wheezes Cardiovascular: ( - ) palpitation, ( - ) chest discomfort, ( - ) lower extremity swelling Gastrointestinal:  ( - ) nausea, ( - ) heartburn, ( - ) change in bowel habits Skin: ( - ) abnormal skin rashes Lymphatics: ( - ) new lymphadenopathy, ( - ) easy bruising Neurological: ( - ) numbness, ( - ) tingling, ( - ) new weaknesses Behavioral/Psych: ( - ) mood change, ( - ) new changes  All other systems were reviewed with the patient and are negative.  PHYSICAL EXAMINATION: Telephone Visit  LABORATORY DATA:  I have reviewed the data as listed CBC Latest Ref Rng & Units 05/15/2021 04/17/2021 03/22/2021  WBC 4.0 - 10.5 K/uL 5.4 4.5 4.9  Hemoglobin 12.0 - 15.0 g/dL 11.8(L) 12.0 10.9(L)  Hematocrit 36.0 - 46.0 % 35.2(L) 34.8(L) 33.3(L)  Platelets 150 - 400 K/uL 246 213 218    CMP Latest Ref Rng & Units 05/15/2021 04/17/2021 03/22/2021  Glucose 70 - 99 mg/dL 79 88 82  BUN 8 - 23 mg/dL 13 17 20   Creatinine 0.44 - 1.00 mg/dL 0.64 0.64 0.64  Sodium 135 - 145 mmol/L 130(L) 131(L) 133(L)  Potassium 3.5 - 5.1 mmol/L 4.6 4.2 4.9  Chloride 98 - 111 mmol/L 94(L) 97(L) 98  CO2 22 - 32 mmol/L 28 28 28   Calcium 8.9 - 10.3 mg/dL 9.6 9.3 9.3  Total Protein 6.5 - 8.1 g/dL 6.2(L) 6.6 6.7  Total Bilirubin 0.3 - 1.2 mg/dL 0.4 0.3 0.4  Alkaline Phos 38 - 126 U/L 40 39 41  AST 15 - 41 U/L 17 16 19   ALT 0 - 44 U/L 12 10 15  RADIOGRAPHIC STUDIES:  MRI Brain 08/05/2020:   CLINICAL DATA:  Trigeminal neuralgia. Headache, cluster/trigeminal. Brain/CNS neoplasm, assess treatment response. Metastatic lung cancer. Additional history provided: SRS to 5 brain metastases 03/17/2019, left parietal craniotomy for tumor resection 03/18/2019, SRS to 2 additional metastases 01/01/2020.   EXAM: MRI HEAD WITHOUT AND WITH CONTRAST   MRA HEAD WITHOUT CONTRAST   TECHNIQUE: Multiplanar, multiecho pulse sequences of the brain and  surrounding structures were obtained without and with intravenous contrast. Angiographic images of the head were obtained using MRA technique without contrast.   CONTRAST:  14m MULTIHANCE GADOBENATE DIMEGLUMINE 529 MG/ML IV SOLN   COMPARISON:  Prior brain MRI examinations 04/30/2020 and earlier. MRA head 03/04/2019.   FINDINGS: MRI HEAD FINDINGS   Brain:   Stable generalized cerebral atrophy.   Again demonstrated is a hemosiderin stained left parietooccipital resection cavity. Irregular and gyriform enhancement has increased from the prior examination, now measuring up to 12 mm in thickness (for instance as seen on series 14, image 90) (series 15, image 9). This irregular enhancement extends to the site of a previously demonstrated 3 mm nodular focus of enhancement within the superolateral aspect of the cavity. Surrounding T2/FLAIR hyperintense signal abnormality has not significantly changed.   A previously demonstrated 4 mm lesion within the medial right cerebellum is no longer appreciated.   A 4 mm enhancing lesion within the left cerebellar hemisphere was present on the prior examination but seen to better advantage on today's study (series 14, image 48).   New 2 mm enhancing lesion more posteriorly within the medial left cerebellar hemisphere (series 14, image 50).   New 2 mm enhancing lesion within the lateral left cerebellum (series 14, image 42).   A 3 mm lesion within the high posterior right frontal lobe has decreased in size (series 14, image 145) (previously 5 mm).   A previously demonstrated 2 mm enhancing lesion within the left frontal lobe has increased in size, now measuring 8 mm (series 14, image 125) (series 11, image 39) . The lesion now demonstrates central T2/FLAIR hyperintensity and T1 hyperintensity with a peripheral low signal rim. Precontrast T1 hyperintensity limits evaluation for enhancement at this site.   Multifocal T2/FLAIR  hyperintensity elsewhere within the cerebral white matter and within the pons is nonspecific, but compatible chronic small vessel ischemic disease.   No evidence of acute infarction.   No extra-axial fluid collection.   No midline shift.   Vascular: Expected proximal arterial flow voids.   Skull and upper cervical spine: No focal suspicious marrow lesion.   Sinuses/Orbits: Visualized orbits show no acute finding. Trace ethmoid sinus mucosal thickening. No significant mastoid effusion.   MRA HEAD FINDINGS   The intracranial internal carotid arteries are patent. The M1 middle cerebral arteries are patent without significant stenosis. No M2 proximal branch occlusion or high-grade proximal stenosis is identified. The anterior cerebral arteries are patent. 1-2 mm inferiorly projecting vascular protrusion arising from the supraclinoid right ICA which may reflect an infundibulum or small aneurysm (series 103, image 12).   The intracranial vertebral arteries are patent. The basilar artery is patent. The posterior cerebral arteries are patent.   IMPRESSION: MRI brain:   1. Irregular and gyriform enhancement at site of the left parietooccipital resection cavity has increased, now measuring up to 12 mm in thickness. This enhancement extends to the site of a previously demonstrated 3 mm nodular enhancing focus within the superolateral aspect of the cavity. Findings may reflect post-treatment inflammation or recurrent tumor. Short interval  MRI follow-up is recommended. Surrounding T2 hyperintense signal abnormality has not appreciably changed. 2. New 2 mm metastasis within the posteromedial left cerebellar hemisphere. 3. New 2 mm metastasis within the lateral left cerebellum. 4. An additional 4 mm metastasis within the left cerebellum was present on the prior MRI of 04/30/2020, but is seen to better advantage on today's study. 5. A previously demonstrated 4 mm metastasis within the  medial right cerebellum is no longer appreciated 6. 8 mm metastasis within the paramedian left frontal lobe, increased in size and now demonstrating precontrast T1 hyperintensity which limits evaluation for enhancement. 7. A 3 mm lesion within the high posterior right frontal lobe has decreased in size.   MRA head:   1. No intracranial large vessel occlusion or proximal high-grade arterial stenosis. 2. 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     Electronically Signed   By: Kellie Simmering DO   On: 08/05/2020 13:49  MR BRAIN W WO CONTRAST  Result Date: 05/12/2021 CLINICAL DATA:  Metastatic lung cancer, follow-up EXAM: MRI HEAD WITHOUT AND WITH CONTRAST TECHNIQUE: Multiplanar, multiecho pulse sequences of the brain and surrounding structures were obtained without and with intravenous contrast. CONTRAST:  43m MULTIHANCE GADOBENATE DIMEGLUMINE 529 MG/ML IV SOLN COMPARISON:  February 03, 2021 FINDINGS: Brain: Left parieto-occipital postoperative changes are again identified with chronic blood products. Enhancement in this region is unchanged. Surrounding T2 FLAIR hyperintensity is stable in extent. No new mass or abnormal enhancement. Unchanged thin epidural collection underlying craniotomy. There is no acute infarction or hemorrhage. No hydrocephalus. Stable findings of probable chronic microvascular ischemic changes in the cerebral and pontine white matter. Small right cerebellar developmental venous anomaly. Vascular: Major vessel flow voids at the skull base are preserved. Skull and upper cervical spine: Left parieto-occipital craniotomy. Marrow signal is within normal limits. Sinuses/Orbits: Paranasal sinuses are aerated. Bilateral lens replacements. Other: Sella is unremarkable.  Mastoid air cells are clear. IMPRESSION: Continued stability of left parieto-occipital enhancement at the site of prior surgery. No new mass, edema, or abnormal enhancement.  Electronically Signed   By: PMacy MisM.D.   On: 05/12/2021 13:59     ASSESSMENT & PLAN [name] 80y.o. female with medical history significant for metastatic adenocarcinoma of the lung with brain metastasis who presents for a follow up visit.  After review the records, review of the labs, review the imaging, discussion with the patient the findings are most consistent with a stage IV adenocarcinoma of the lung with a small lung lesion and metastatic spread to the brain.  Her metastatic brain mets have been treated with resection of tumor on 03/18/2019 as well as SRS on 12/12/2019.  Most recently she was found to have 2 new subcentimeter lesions and underwent stereotactic radiation therapy which was completed on 05/20/2020. Unfortunately on 08/05/2020 she was noted to have new CNS metastatic disease.   There are limitations in what therapies can be used for this patient given her poor functional status.  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  After extensive discussions with the patient's about the risks and benefits of this treatment she was agreeable to starting osimertinib 80 mg p.o. daily for her metastatic adenocarcinoma of the lung. She started this treatment on 09/05/2020.  On discussion today [name] is tolerating Tagrisso well and is willing and able to proceed at this time.  Restaging CT scans are planned for August  2022.   #Stage IV (pT1bpN0M1) adenocarcinoma of the lung: -Left lower lobe resection on 12/21/2010, 14 lymph nodes negative, 2.2 cm tumor size, margins negative. -most recent MRI brain scan shows stable disease. No evidence of Chest/abdomen involvement on scan from 02/14/2021.  --started therapy with osimertinib 80 mg PO daily for her EGFR Exon 19 mutation. She started on 09/05/2020 -CT chest with contrast on 02/14/2021 showed no active disease in the chest. Continue CT scans q 3 months.  -PD-L1 testing was 1%. --  Patient is a poor candidate for systemic therapy, though she does have an EGFR Exon 19 mutation.  --RTC in 4 weeks time to assure continued tolerance of osimirtenib.   #Hand Foot Syndrome, resolved --previously noted to have desquamation and erythema of finger tips. Resolved on exam today.  --held therapy for 1-2 weeks until resolution --patient had resolution of symptoms after 2 weeks off therapy. Encouraged continue aggressive moisturizing lotion.    #Brain metastasis, stable -Presentation with expressive aphasia to ER on 03/03/2019 along with right-sided weakness.  Found to have multiple brain metastasis and underwent SRS on 03/17/2019. 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 -Underwent SRS on 01/01/2020 for 2 subcentimeter brain lesions. -Brain MRI on 04/30/2020 showed 2 new subcentimeter meta stasis in the right cerebellum and right frontal lobe. -Stereotactic radiation therapy was completed on 05/20/2020 --most recent MRI on 02/03/2021 showed stable disease. Continue systemic therapy as above.   No orders of the defined types were placed in this encounter.   All questions were answered. The patient knows to call the clinic with any problems, questions or concerns.  A total of more than 30 minutes were spent on this encounter and over half of that time was spent on counseling and coordination of care as outlined above.   Ledell Peoples, MD Department of Hematology/Oncology [place] [facility] Phone: [phone] Pager: [phone] Email: [email or web address]  05/15/2021 3:58 PM    Suszanne Conners HA, Su JM, [place] SH, Rowe Pavy, [place]. 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 J Thorac Oncol. 2020 Nov;15(11):1758-1766. doi:  10.1016/[email or web address].2020.06.018. Epub 2020 Jul 9. PMID: [id number].  --Osimertinib is a promising treatment option for EGFR-mutated NSCLC with LM regardless of T790M mutational status.

## 2021-05-16 ENCOUNTER — Other Ambulatory Visit: Payer: Self-pay | Admitting: Family Medicine

## 2021-05-17 ENCOUNTER — Encounter (HOSPITAL_COMMUNITY): Payer: Self-pay | Admitting: Radiology

## 2021-05-17 ENCOUNTER — Other Ambulatory Visit (HOSPITAL_COMMUNITY): Payer: Self-pay

## 2021-05-17 ENCOUNTER — Ambulatory Visit (HOSPITAL_COMMUNITY)
Admission: RE | Admit: 2021-05-17 | Discharge: 2021-05-17 | Disposition: A | Discharge status: Home / Self Care | Payer: PPO | Source: Ambulatory Visit | Attending: Hematology and Oncology | Admitting: Hematology and Oncology

## 2021-05-17 ENCOUNTER — Other Ambulatory Visit: Payer: Self-pay

## 2021-05-17 ENCOUNTER — Telehealth: Payer: Self-pay | Admitting: Hematology and Oncology

## 2021-05-17 DIAGNOSIS — I7772 Dissection of iliac artery: Secondary | ICD-10-CM | POA: Diagnosis not present

## 2021-05-17 DIAGNOSIS — C3432 Malignant neoplasm of lower lobe, left bronchus or lung: Secondary | ICD-10-CM | POA: Diagnosis not present

## 2021-05-17 DIAGNOSIS — I714 Abdominal aortic aneurysm, without rupture: Secondary | ICD-10-CM | POA: Diagnosis not present

## 2021-05-17 DIAGNOSIS — I700 Atherosclerosis of aorta: Secondary | ICD-10-CM | POA: Diagnosis not present

## 2021-05-17 DIAGNOSIS — I723 Aneurysm of iliac artery: Secondary | ICD-10-CM | POA: Diagnosis not present

## 2021-05-17 DIAGNOSIS — C3492 Malignant neoplasm of unspecified part of left bronchus or lung: Secondary | ICD-10-CM | POA: Diagnosis not present

## 2021-05-17 MED ORDER — IOHEXOL 350 MG/ML SOLN
100.0000 mL | Freq: Once | INTRAVENOUS | Status: AC | PRN
  Administered 2021-05-17: 80 mL via INTRAVENOUS

## 2021-05-17 NOTE — Telephone Encounter (Signed)
Scheduled per los. Called and spoke with patient. Confirmed appt 

## 2021-05-18 ENCOUNTER — Telehealth: Payer: Self-pay | Admitting: *Deleted

## 2021-05-18 ENCOUNTER — Inpatient Hospital Stay (HOSPITAL_BASED_OUTPATIENT_CLINIC_OR_DEPARTMENT_OTHER): Payer: PPO | Admitting: Internal Medicine

## 2021-05-18 VITALS — BP 114/75 | HR 72 | Temp 97.6°F | Resp 17 | Ht 60.0 in

## 2021-05-18 DIAGNOSIS — C3432 Malignant neoplasm of lower lobe, left bronchus or lung: Secondary | ICD-10-CM | POA: Diagnosis not present

## 2021-05-18 DIAGNOSIS — C7931 Secondary malignant neoplasm of brain: Secondary | ICD-10-CM

## 2021-05-18 NOTE — Telephone Encounter (Signed)
-----   Message from Orson Slick, MD sent at 05/18/2021  4:29 PM EDT ----- Please let Mrs. Eplin know that her CT scan showed no evidence of cancer in the Chest/abdomen/pelvis. Her scans continue to be excellent. We will see her back in 1 month to continue evaluating.  ----- Message ----- From: Interface, Rad Results In Sent: 05/18/2021   4:13 PM EDT To: Orson Slick, MD

## 2021-05-18 NOTE — Progress Notes (Signed)
[place] at [place],  [id number] [phone]  Interval Evaluation  Date of Service: 05/18/21 Patient Name: [name] Patient MRN: [id number] Patient DOB: September 07, 1941 Provider: Ventura Sellers, MD  Identifying Statement:  [name] is a 80 y.o. 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 0/14 lymph nodes.  No perineural invasion or LVI.  negative margins.   06/05/2016 Imaging   Although the previously described ground-glass attenuation nodule in the superior segment of the right lower lobe measures slightly larger on today's examination than prior studies, this is favored to be technique related. Today's study should serve as a baseline for future followup examinations. At this time, there is no central solid component to this lesion. No new nodules are noted. 2. Stable postoperative scarring in the left lower lobe and post infectious scarring throughout the visualize lung bases, as above. 3. Areas of cylindrical bronchiectasis throughout the lung bases bilaterally    05/09/2017 Imaging   IMPRESSION: 1. Status post left lower lobe wedge resection. No findings to suggest local recurrence of disease or definite metastatic disease in the thorax. Previously noted ground-glass attenuation nodule in the superior segment of the right lower lobe appears slightly smaller than the prior study, suggesting a benign etiology. 2. Diffuse bronchial wall thickening with moderate to severe centrilobular and paraseptal emphysema; imaging findings suggestive of underlying COPD. 3. Aortic atherosclerosis, in addition to left main and left circumflex coronary artery disease. Assessment for potential risk factor  modification, dietary therapy or pharmacologic therapy may be warranted, if clinically indicated. 4. Increasing anterior pericardial fluid and/or thickening. This is unlikely to be of hemodynamic significance at this time. No associated pericardial calcification. 5. New (but non acute) compression fracture of T6 with approximately 90% loss of anterior vertebral body height, in addition to multiple other chronic compression fractures of the visualize the thoracolumbar spine.   03/22/2020 Genetic Testing   PD-L1     03/29/2020 Genetic Testing   Foundation One       CNS Oncologic History 03/17/19: Left parietal pre-op SRS, plus SRS to 4 additional smaller targets 03/18/19: Left parietal craniotomy for resection of tumor with Dr. Kathyrn Sheriff  01/01/20: SRS to 2 additional sub-cm metastases 05/20/20: SRS to 2 further metastases 08/05/20: Progressive disease, started Tagrisso therapy  Interval History:  [name] presents following recent MRI brain.  No new or progressive deficits. Tolerating Tagrisso well aside from some skin issues.  Right sided visual field impairment in static as prior since surgery.  Still utilizes walker for ambulation, but doesn't get out of the apartment much. No seizures or headaches.    Medications: Current Outpatient Medications on File Prior to Visit  Medication Sig Dispense Refill   albuterol (VENTOLIN HFA) 108 (90 Base) MCG/ACT inhaler INHALE 2 PUFFS EVERY 6 HOURS AS NEEDED FOR WHEEZING OR SHORTNESS OF BREATH. 8.5 g 0   ALPRAZolam (XANAX) 0.5 MG tablet TAKE (1) TABLET BY MOUTH TWICE A DAY AS NEEDED. 60 tablet 0   citalopram (CELEXA) 40 MG tablet TAKE (1/2) TABLET BY MOUTH AT BEDTIME. 45 tablet 0   ferrous sulfate 325 (65 FE) MG tablet Take 325 mg by mouth daily with breakfast.     fluticasone (FLONASE) 50 MCG/ACT nasal spray Place 2 sprays  into both nostrils daily. 16 g 6   Fluticasone-Umeclidin-Vilant (TRELEGY ELLIPTA) 100-62.5-25 MCG/INH AEPB INHALE  1 PUFF INTO LUNGS DAILY. 60 each 6   gabapentin (NEURONTIN) 100 MG capsule TAKE 1 CAPSULE BY MOUTH THREE TIMES A DAY. 90 capsule 6   levocetirizine (XYZAL) 5 MG tablet TAKE ONE TABLET BY MOUTH ONCE DAILY. 30 tablet 6   levothyroxine (SYNTHROID) 50 MCG tablet TAKE 1 TABLET BEFORE BREAKFAST. 90 tablet 0   Multiple Vitamin (MULTIVITAMIN WITH MINERALS) TABS tablet Take 1 tablet by mouth daily.     ondansetron (ZOFRAN) 4 MG tablet TAKE 1 TABLET BY MOUTH 3 TIMES DAILY AS NEEDED FOR NAUSEA AND VOMITING. 90 tablet 0   osimertinib mesylate (TAGRISSO) 80 MG tablet TAKE 1 TABLET (80 MG TOTAL) BY MOUTH DAILY. 30 tablet 2   OVER THE COUNTER MEDICATION Bone Support 2-4 tabs daily     OXcarbazepine (TRILEPTAL) 150 MG tablet TAKE (1) TABLET BY MOUTH TWICE DAILY. 60 tablet 6   pantoprazole (PROTONIX) 40 MG tablet TAKE (1) TABLET BY MOUTH ONCE DAILY BEFORE BREAKFAST. 90 tablet 1   predniSONE (DELTASONE) 50 MG tablet Please take one tablet at 13 hours, 7 hours and one hour prior to CT scan 3 tablet 0   Probiotic Product (ALIGN PO) Take by mouth daily.     PROLIA 60 MG/ML SOSY injection INJECT INTO UPPER ARM, THIGH, OR ABDOMEN ONCE. 1 mL 0   diphenhydrAMINE (BENADRYL) 50 MG tablet Take 1 tablet (50 mg total) by mouth once for 1 dose. Take one hour prior to CT scan 1 tablet 0   No current facility-administered medications on file prior to visit.    Allergies:  Allergies  Allergen Reactions   Ciprofloxacin Hcl Hives   Sulfonamide Derivatives Hives and Rash   Ciprofloxacin Hives   Iohexol Other (See Comments)    UNSPECIFIED REACTION  Desc: Per [facility], pt is allergic to IV contrast, no type of reaction was available. Pt cannot remember but first reacted around 15 yrs ago from an IVP. 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[place])    corrected by surgery 08/21/13   Anxiety disorder    Aortic aneurysm ([place])    Brain metastasis ([place])    lung cancer s/p resection 7/20   Cancer ([place])    Phreesia 45/80/9983   Complication of anesthesia 2011   bloodpressure dropped during colonoscopy, not problems since   Compression fracture    COPD (chronic obstructive pulmonary disease) ([place])    Depression    Diastolic dysfunction    Diverticulosis    Dysphagia    Emphysema lung ([place]) 11/08/2014   Headache    Hiatal hernia    History of kidney stones    Hypothyroidism    IBS (irritable bowel syndrome)    Iron deficiency anemia due to chronic blood loss 12/05/2016   On home O2 12/15/2013   chronic hypoxia   Pneumonia    PUD (peptic ulcer disease)    24yr   Shortness of breath    with exertion   SIADH (syndrome of inappropriate ADH production) ([place]    Trigeminal neuralgia    Past Surgical History:  Past Surgical History:  Procedure Laterality Date   ABDOMINAL HYSTERECTOMY     APPLICATION OF CRANIAL NAVIGATION N/A 03/18/2019   Procedure: APPLICATION OF CRANIAL NAVIGATION;  Surgeon: Consuella Lose, MD;  Location: [facility];  Service: Neurosurgery;  Laterality: N/A;   BACK SURGERY  January 13, 2014   BRAIN SURGERY N/A    Phreesia 03/28/2020   CATARACT EXTRACTION     CATARACT EXTRACTION W/PHACO  09/02/2012   Procedure: CATARACT EXTRACTION PHACO AND INTRAOCULAR LENS PLACEMENT (IOC);  Surgeon: Elta Guadeloupe T. 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 Oneida Alar: moderate diverticulosis, hemorrhoids, redundant colon. next TCS in 10-15 years.    CRANIOTOMY Left 03/18/2019   Procedure: Left stereotactic craniotomy for tumor resection;  Surgeon: Consuella Lose, MD;  Location: [facility];  Service: Neurosurgery;  Laterality: Left;  Left stereotactic craniotomy for tumor resection   ENDARTERECTOMY Right 08/21/2013   Procedure: RIGHT CAROTID ANEURYSM RESECTION;  Surgeon: Rosetta Posner, MD;  Location: [facility];  Service: Vascular;  Laterality: Right;   ESOPHAGEAL DILATION  02/24/2018   Procedure: ESOPHAGEAL DILATION;  Surgeon: Danie Binder, MD;  [facility];  Service: Endoscopy;;   ESOPHAGOGASTRODUODENOSCOPY  11/13/2009   w/dilation to 38m, gastric ulceration (H.Pylori) s/p treatment   ESOPHAGOGASTRODUODENOSCOPY  11/21/2009   distal esophageal web, gastritis   ESOPHAGOGASTRODUODENOSCOPY  03/14/12   SHEN:IDPOEUMPNin the distal esophagus/Mild gastritis/small HH. + H.pylori, prescribed Pylera. Finished treatment.    ESOPHAGOGASTRODUODENOSCOPY N/A 12/13/2017   Dr. FOneida Alar esophageal web s/p dilation, gastritis, no h.pylori   ESOPHAGOGASTRODUODENOSCOPY N/A 02/24/2018   Dr. FOneida Alar web in prox esophagus and in distal esophagus s/p dilation, mild gastritis, mild pyloric stenosis, mild post ulcer duodenal deformity at D1/D2   fatty tumor removal from lt groin     FRACTURE SURGERY Left    hand and left ring finger   GIVENS CAPSULE STUDY N/A 12/26/2017   incomplete   GIVENS CAPSULE STUDY N/A 02/24/2018   normal small bowel   KNEE SURGERY     patella tendon repair june 2019 [facility]   LUNG CANCER SURGERY  12/2010   Left VATS, minithoracotomy, LLL superior segmentectomy   ORIF PATELLA Right 03/18/2018   Procedure: OPEN REDUCTION INTERNAL (ORIF) FIXATION RIGHT PATELLA;  Surgeon: HCarole Civil MD;  [facility];  Service: Orthopedics;  Laterality: Right;   ORIF WRIST FRACTURE Left 04/09/2014   Procedure: OPEN REDUCTION INTERNAL FIXATION (ORIF) WRIST FRACTURE;  Surgeon: TRenette Butters MD;  [place] [facility]  Service: Orthopedics;  Laterality: Left;   PERCUTANEOUS PINNING Left 04/09/2014   Procedure: PERCUTANEOUS PINNING EXTREMITY;  Surgeon: TRenette Butters MD;  [place] [facility]  Service: Orthopedics;  Laterality: Left;   SAVORY DILATION N/A 12/13/2017   Procedure: SAVORY DILATION;  Surgeon: FDanie Binder MD;  [facility];  Service: Endoscopy;  Laterality: N/A;   TUBAL LIGATION     vocal cord surgery  02/06/2011   laryngoscopy with bilateral vocal cord Radiesse injection for vocal cord paralysis   YAG LASER APPLICATION Left 136/14/4315  Procedure: YAG LASER APPLICATION;  Surgeon: MRutherford Guys MD;  [facility];  Service: Ophthalmology;  Laterality: Left;   Social History:  Social History   Socioeconomic History   Marital status: Divorced    Spouse name: Not on file   Number of  children: Not on file   Years of education: Not on file   Highest education level: Not on file  Occupational History   Not on file  Tobacco Use   Smoking status: Former    Packs/day: 0.50    Years: 20.00    Pack years: 10.00    Types: Cigarettes    Quit date: 12/21/2010    Years since quitting: 10.4   Smokeless tobacco: Never   Tobacco comments:    smoking cessation info given and reviewed   Vaping Use   Vaping Use: Never used  Substance and Sexual Activity   Alcohol use: Not Currently    Alcohol/week: 0.0 standard drinks   Drug use: No   Sexual activity: Yes    Birth control/protection: Surgical  Other Topics Concern   Not on file  Social History Narrative   Divorced since 59.Lives alone with a dog.Marland KitchenRetired.  She receives Meals on Wheels.   Social Determinants of Health   Financial Resource Strain: Low Risk    Difficulty of Paying Living Expenses: Not hard at all  Food Insecurity: No Food Insecurity   Worried About Charity fundraiser in the Last Year: Never true   [place] in the Last Year: Never true  Transportation Needs: No Transportation Needs   Lack of Transportation (Medical): No   Lack of Transportation (Non-Medical): No  Physical Activity: Inactive   Days of Exercise per Week: 0 days   Minutes of  Exercise per Session: 0 min  Stress: No Stress Concern Present   Feeling of Stress : Not at all  Social Connections: Socially Isolated   Frequency of Communication with Friends and Family: More than three times a week   Frequency of Social Gatherings with Friends and Family: More than three times a week   Attends Religious Services: Never   Marine scientist or Organizations: No   Attends Music therapist: Never   Marital Status: Divorced  Human resources officer Violence: Not At Risk   Fear of Current or Ex-Partner: No   Emotionally Abused: No   Physically Abused: No   Sexually Abused: No   Family History:  Family History  Problem Relation Age of Onset   Pulmonary embolism Mother    Heart attack Father    Hypertension Father    Liver cancer Sister    Cancer Sister    Cancer Brother    Cancer Daughter    Breast cancer Daughter    Colon cancer Neg Hx     Review of Systems: Constitutional: Doesn't report fevers, chills or abnormal weight loss Eyes: Doesn't report blurriness of vision Ears, nose, mouth, throat, and face: Doesn't report sore throat Respiratory: Doesn't report cough, dyspnea or wheezes Cardiovascular: Doesn't report palpitation, chest discomfort  Gastrointestinal:  Doesn't report nausea, constipation, diarrhea GU: Doesn't report incontinence Skin: Doesn't report skin rashes Neurological: Per HPI Musculoskeletal: Doesn't report joint pain Behavioral/Psych: Doesn't report anxiety  Physical Exam: Vitals:   05/18/21 1101  BP: 114/75  Pulse: 72  Resp: 17  Temp: 97.6 F (36.4 C)  SpO2: 96%   KPS: 80. General: Alert, cooperative, pleasant, in no acute distress Head: Normal EENT: No conjunctival injection or scleral icterus.  Lungs: Resp effort normal Cardiac: Regular rate Abdomen: Non-distended abdomen Skin: No rashes cyanosis or petechiae. Extremities: No clubbing or edema  Neurologic Exam: Mental Status: Awake, alert, attentive to  examiner. Oriented to self and environment. Language is fluent with intact comprehension.  Cranial Nerves:  Visual acuity is grossly normal. Right homonymous hemianopia. Extra-ocular movements intact. No ptosis. Face is symmetric Motor: Tone and bulk are normal. Power is full in both arms and legs. Reflexes are symmetric, no pathologic reflexes present.  Sensory: Intact to light touch Gait: Normal.   Labs: I have reviewed the data as listed    Component Value Date/Time   NA 130 (L) 05/15/2021 1045   K 4.6 05/15/2021 1045   CL 94 (L) 05/15/2021 1045   CO2 28 05/15/2021 1045   GLUCOSE 79 05/15/2021 1045   BUN 13 05/15/2021 1045   CREATININE 0.64 05/15/2021 1045   CREATININE 0.83 01/26/2020 1623   CALCIUM 9.6 05/15/2021 1045   PROT 6.2 (L) 05/15/2021 1045   ALBUMIN 3.9 05/15/2021 1045   AST 17 05/15/2021 1045   ALT 12 05/15/2021 1045   ALKPHOS 40 05/15/2021 1045   BILITOT 0.4 05/15/2021 1045   GFRNONAA >60 05/15/2021 1045   GFRNONAA 68 01/26/2020 1623   GFRAA >60 06/20/2020 0949   GFRAA 78 01/26/2020 1623   Lab Results  Component Value Date   WBC 5.4 05/15/2021   NEUTROABS 4.3 05/15/2021   HGB 11.8 (L) 05/15/2021   HCT 35.2 (L) 05/15/2021   MCV 87.8 05/15/2021   PLT 246 05/15/2021    Imaging:  [place] Clinician Interpretation: I have personally reviewed the CNS images as listed.  My interpretation, in the context of the patient's clinical presentation, is stable disease  MR BRAIN W WO CONTRAST  Result Date: 05/12/2021 CLINICAL DATA:  Metastatic lung cancer, follow-up EXAM: MRI HEAD WITHOUT AND WITH CONTRAST TECHNIQUE: Multiplanar, multiecho pulse sequences of the brain and surrounding structures were obtained without and with intravenous contrast. CONTRAST:  50m MULTIHANCE GADOBENATE DIMEGLUMINE 529 MG/ML IV SOLN COMPARISON:  February 03, 2021 FINDINGS: Brain: Left parieto-occipital postoperative changes are again identified with chronic blood products. Enhancement in this region  is unchanged. Surrounding T2 FLAIR hyperintensity is stable in extent. No new mass or abnormal enhancement. Unchanged thin epidural collection underlying craniotomy. There is no acute infarction or hemorrhage. No hydrocephalus. Stable findings of probable chronic microvascular ischemic changes in the cerebral and pontine white matter. Small right cerebellar developmental venous anomaly. Vascular: Major vessel flow voids at the skull base are preserved. Skull and upper cervical spine: Left parieto-occipital craniotomy. Marrow signal is within normal limits. Sinuses/Orbits: Paranasal sinuses are aerated. Bilateral lens replacements. Other: Sella is unremarkable.  Mastoid air cells are clear. IMPRESSION: Continued stability of left parieto-occipital enhancement at the site of prior surgery. No new mass, edema, or abnormal enhancement. Electronically Signed   By: PMacy MisM.D.   On: 05/12/2021 13:59     Assessment/Plan Brain metastases ([place] [C79.31]  [name] is clinically and radiographically stable today.  Continues to demonstrate good control of CNS burden with Osimertinib.  No new or progressive deficits.  We appreciate the opportunity to participate in the care of [name]   We ask that Ruth POTEETreturn to clinic in 4 months following next brain MRI, or sooner as needed.  All questions were answered. The patient knows to call the clinic with any problems, questions or concerns. No barriers to learning were detected.  I have spent a total of 30 minutes of face-to-face and non-face-to-face time, excluding clinical staff time, preparing to see patient, ordering tests and/or medications, counseling the patient, and independently interpreting results and communicating results to the patient/family/caregiver   ZVentura Sellers MD Medical Director of Neuro-Oncology [facility]  [place] at [facility] 05/18/21 11:24 AM

## 2021-05-18 NOTE — Telephone Encounter (Signed)
TCT patient regarding recent scan results. Spoke with [name] and informed her that her scans were excellent-no evidence of cancer. Pt very pleased with this. She saw Dr. Mickeal Skinner today as well with a good report from him as well. Pt aware of her f/u appts.

## 2021-05-19 ENCOUNTER — Telehealth: Payer: Self-pay | Admitting: Internal Medicine

## 2021-05-19 NOTE — Telephone Encounter (Signed)
Scheduled appt per 8/11 los. Pt aware.

## 2021-05-23 ENCOUNTER — Other Ambulatory Visit: Payer: Self-pay | Admitting: Radiation Therapy

## 2021-05-25 ENCOUNTER — Ambulatory Visit: Payer: PPO | Admitting: Internal Medicine

## 2021-05-27 ENCOUNTER — Other Ambulatory Visit: Payer: Self-pay | Admitting: Gastroenterology

## 2021-05-27 ENCOUNTER — Other Ambulatory Visit: Payer: Self-pay | Admitting: Family Medicine

## 2021-05-27 MED ORDER — PANTOPRAZOLE SODIUM 40 MG PO TBEC: DELAYED_RELEASE_TABLET | ORAL | 1 refills | Status: DC

## 2021-06-01 DIAGNOSIS — R0902 Hypoxemia: Secondary | ICD-10-CM | POA: Diagnosis not present

## 2021-06-01 DIAGNOSIS — J9621 Acute and chronic respiratory failure with hypoxia: Secondary | ICD-10-CM | POA: Diagnosis not present

## 2021-06-01 DIAGNOSIS — J449 Chronic obstructive pulmonary disease, unspecified: Secondary | ICD-10-CM | POA: Diagnosis not present

## 2021-06-01 DIAGNOSIS — C3490 Malignant neoplasm of unspecified part of unspecified bronchus or lung: Secondary | ICD-10-CM | POA: Diagnosis not present

## 2021-06-07 ENCOUNTER — Other Ambulatory Visit: Payer: Self-pay | Admitting: *Deleted

## 2021-06-07 MED ORDER — PROLIA 60 MG/ML ~~LOC~~ SOSY: PREFILLED_SYRINGE | SUBCUTANEOUS | 0 refills | Status: DC

## 2021-06-08 ENCOUNTER — Telehealth: Payer: Self-pay

## 2021-06-08 ENCOUNTER — Telehealth: Payer: Self-pay | Admitting: Family Medicine

## 2021-06-08 NOTE — Telephone Encounter (Signed)
We do not notarize forms in our office.

## 2021-06-08 NOTE — Telephone Encounter (Signed)
Called pt to inform her that we do not notarize here at the office. Pt needed some info notarized for [employer], that required signatures to be witnessed. Pt was informed that pcp had provided his signature, but info is still not notarized. Pt informed me to put letter in the envelope and mail to [employer]. Letter has been mailed out.

## 2021-06-08 NOTE — Telephone Encounter (Signed)
Patient's spouse Patrick Jupiter dropped off a medical release form; already signed by provider. Needs to be notarized and mailed to [employer] (addressed and stamped envelope attached). Paperwork placed in hanging folder at [employer].

## 2021-06-09 ENCOUNTER — Other Ambulatory Visit: Payer: Self-pay | Admitting: Hematology and Oncology

## 2021-06-09 ENCOUNTER — Other Ambulatory Visit (HOSPITAL_COMMUNITY): Payer: Self-pay

## 2021-06-09 MED ORDER — OSIMERTINIB MESYLATE 80 MG PO TABS
ORAL_TABLET | Freq: Every day | ORAL | 2 refills | Status: DC
  Filled 2021-06-09: qty 30, 30d supply, fill #0
  Filled 2021-07-04: qty 30, 30d supply, fill #1
  Filled 2021-08-03: qty 30, 30d supply, fill #2

## 2021-06-14 ENCOUNTER — Other Ambulatory Visit: Payer: Self-pay

## 2021-06-14 ENCOUNTER — Inpatient Hospital Stay: Payer: PPO | Attending: Internal Medicine | Admitting: Hematology and Oncology

## 2021-06-14 ENCOUNTER — Inpatient Hospital Stay: Payer: PPO

## 2021-06-14 VITALS — BP 88/64 | HR 76 | Temp 98.0°F | Resp 18 | Wt 106.5 lb

## 2021-06-14 DIAGNOSIS — Z79899 Other long term (current) drug therapy: Secondary | ICD-10-CM | POA: Insufficient documentation

## 2021-06-14 DIAGNOSIS — C7931 Secondary malignant neoplasm of brain: Secondary | ICD-10-CM | POA: Insufficient documentation

## 2021-06-14 DIAGNOSIS — E039 Hypothyroidism, unspecified: Secondary | ICD-10-CM | POA: Diagnosis not present

## 2021-06-14 DIAGNOSIS — E222 Syndrome of inappropriate secretion of antidiuretic hormone: Secondary | ICD-10-CM | POA: Insufficient documentation

## 2021-06-14 DIAGNOSIS — Z87891 Personal history of nicotine dependence: Secondary | ICD-10-CM | POA: Diagnosis not present

## 2021-06-14 DIAGNOSIS — C3432 Malignant neoplasm of lower lobe, left bronchus or lung: Secondary | ICD-10-CM | POA: Diagnosis not present

## 2021-06-14 DIAGNOSIS — C3492 Malignant neoplasm of unspecified part of left bronchus or lung: Secondary | ICD-10-CM | POA: Diagnosis not present

## 2021-06-14 DIAGNOSIS — Z7952 Long term (current) use of systemic steroids: Secondary | ICD-10-CM | POA: Insufficient documentation

## 2021-06-14 DIAGNOSIS — M8580 Other specified disorders of bone density and structure, unspecified site: Secondary | ICD-10-CM | POA: Diagnosis not present

## 2021-06-14 LAB — CBC WITH DIFFERENTIAL (CANCER CENTER ONLY)
Abs Immature Granulocytes: 0.01 10*3/uL (ref 0.00–0.07)
Basophils Absolute: 0 10*3/uL (ref 0.0–0.1)
Basophils Relative: 1 %
Eosinophils Absolute: 0.1 10*3/uL (ref 0.0–0.5)
Eosinophils Relative: 1 %
HCT: 34.1 % — ABNORMAL LOW (ref 36.0–46.0)
Hemoglobin: 11.6 g/dL — ABNORMAL LOW (ref 12.0–15.0)
Immature Granulocytes: 0 %
Lymphocytes Relative: 11 %
Lymphs Abs: 0.7 10*3/uL (ref 0.7–4.0)
MCH: 29.7 pg (ref 26.0–34.0)
MCHC: 34 g/dL (ref 30.0–36.0)
MCV: 87.2 fL (ref 80.0–100.0)
Monocytes Absolute: 0.5 10*3/uL (ref 0.1–1.0)
Monocytes Relative: 8 %
Neutro Abs: 4.6 10*3/uL (ref 1.7–7.7)
Neutrophils Relative %: 79 %
Platelet Count: 250 10*3/uL (ref 150–400)
RBC: 3.91 MIL/uL (ref 3.87–5.11)
RDW: 12.8 % (ref 11.5–15.5)
WBC Count: 5.8 10*3/uL (ref 4.0–10.5)
nRBC: 0 % (ref 0.0–0.2)

## 2021-06-14 LAB — CMP (CANCER CENTER ONLY)
ALT: 9 U/L (ref 0–44)
AST: 17 U/L (ref 15–41)
Albumin: 4 g/dL (ref 3.5–5.0)
Alkaline Phosphatase: 48 U/L (ref 38–126)
Anion gap: 8 (ref 5–15)
BUN: 17 mg/dL (ref 8–23)
CO2: 28 mmol/L (ref 22–32)
Calcium: 9.5 mg/dL (ref 8.9–10.3)
Chloride: 92 mmol/L — ABNORMAL LOW (ref 98–111)
Creatinine: 0.68 mg/dL (ref 0.44–1.00)
GFR, Estimated: >60 mL/min (ref >60)
Glucose, Bld: 84 mg/dL (ref 70–99)
Potassium: 4.6 mmol/L (ref 3.5–5.1)
Sodium: 128 mmol/L — ABNORMAL LOW (ref 135–145)
Total Bilirubin: 0.4 mg/dL (ref 0.3–1.2)
Total Protein: 6.5 g/dL (ref 6.5–8.1)

## 2021-06-14 NOTE — Progress Notes (Signed)
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 0/14 lymph nodes.  No perineural invasion or LVI.  negative margins 2) 03/03/2019: presented to ED with expressive aphasia, found to have multiple brain metastasis and underwent SRS on 03/17/2019. 3) 03/18/2019: Stereotactic left parietal craniotomy for resection of tumor 4) 04/30/2020: Brain MRI on showed 2 new subcentimeter meta stasis in the right cerebellum and right frontal lobe. 5)  05/20/2020: Stereotactic radiation therapy was completed 6) 06/23/2020: establish care with Dr. [name]  7) 08/29/2020: prescribed osimirtinib therapy due to next metastatic disease in the brain.  8) 09/05/2020: started osimirtinib 3m PO daily 9) 10/19/2020: CT Chest showed no evidence of residual disease 10) 11/21/2020: desquamation and erythema of finger tips. Concern for HFS. Temporarily holding therapy  11) 12/05/2020: restarted Tagrisso after resolution of the HFS symptoms  Interval History:  [name] 80y.o. female with medical history significant for metastatic adenocarcinoma of the lung with brain metastasis who presents for a follow up visit. The patient's last visit was on 05/15/2021.  In the interim since the last visit the patient has continued on her Tagrisso with no major isuses.   On exam today [name] reports that she has been feeling a little blue.  She thinks that this may be due  to the fact she has not been able to go out on her porch as much to get some pressure.  She recently had a ramp built in order to help her get down to the street to do some walking but her neighbors were critical of this.  She notes that her hands have been great with no dryness or cracking.  She has had no major side effects as a result of her Tagrisso therapy.  She is at her baseline level of 2 L of oxygen.  She otherwise has been quite well and is willing and able to proceed with osimertinib therapy.  She currently denies any fevers, chills, sweats, nausea vomiting or diarrhea.  A full ten point ROS is listed below.  MEDICAL HISTORY:  Past Medical History:  Diagnosis Date   Adenocarcinoma of lung ([facility] 12/2010   left lung/surg only   Allergic rhinitis    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[facility]    corrected by surgery 08/21/13   Anxiety disorder    Aortic aneurysm ([facility]    Brain metastasis ([facility]    lung cancer s/p resection 7/20   Cancer ([facility]    Phreesia [id number]  Complication of anesthesia 2011   bloodpressure dropped during colonoscopy, not problems since   Compression fracture    COPD (chronic obstructive pulmonary disease) (HCC)    Depression    Diastolic dysfunction    Diverticulosis    Dysphagia    Emphysema lung ([facility] 11/08/2014   Headache    Hiatal hernia    History of kidney stones    Hypothyroidism    IBS (irritable bowel syndrome)    Iron deficiency anemia due to chronic blood loss 12/05/2016   On home O2 12/15/2013   chronic  hypoxia   Pneumonia    PUD (peptic ulcer disease)    53yr   Shortness of breath    with exertion   SIADH (syndrome of inappropriate ADH production) (HCC)    Trigeminal neuralgia     SURGICAL HISTORY: Past Surgical History:  Procedure Laterality Date   ABDOMINAL HYSTERECTOMY     APPLICATION OF CRANIAL NAVIGATION N/A 03/18/2019   Procedure: APPLICATION OF CRANIAL NAVIGATION;  Surgeon: NConsuella Lose MD;  [place] [facility]  Service:  Neurosurgery;  Laterality: N/A;   BACK SURGERY  January 13, 2014   BRAIN SURGERY N/A    Phreesia 03/28/2020   CATARACT EXTRACTION     CATARACT EXTRACTION W/PHACO  09/02/2012   Procedure: CATARACT EXTRACTION PHACO AND INTRAOCULAR LENS PLACEMENT (IOC);  Surgeon: MElta GuadeloupeT. SGershon Crane MD;  Location: [facility];  Service: Ophthalmology;  Laterality: Left;  CDE:10.35   CHOLECYSTECTOMY     COLONOSCOPY  2011   hyperplastic polyp, 3-4 small cecal AVMs, nonbleeding   COLONOSCOPY N/A 11/23/2014   Dr. FOneida Alar moderate diverticulosis, hemorrhoids, redundant colon. next TCS in 10-15 years.    CRANIOTOMY Left 03/18/2019   Procedure: Left stereotactic craniotomy for tumor resection;  Surgeon: NConsuella Lose MD;  [facility]  Service: Neurosurgery;  Laterality: Left;  Left stereotactic craniotomy for tumor resection   ENDARTERECTOMY Right 08/21/2013   Procedure: RIGHT CAROTID ANEURYSM RESECTION;  Surgeon: TRosetta Posner MD;  [place]: [facility]  Service: Vascular;  Laterality: Right;   ESOPHAGEAL DILATION  02/24/2018   Procedure: ESOPHAGEAL DILATION;  Surgeon: FDanie Binder MD;  [facility];  Service: Endoscopy;;   ESOPHAGOGASTRODUODENOSCOPY  11/13/2009   w/dilation to 113m gastric ulceration (H.Pylori) s/p treatment   ESOPHAGOGASTRODUODENOSCOPY  11/21/2009   distal esophageal web, gastritis   ESOPHAGOGASTRODUODENOSCOPY  03/14/12   SLDJM:EQASTMHDQn the distal esophagus/Mild gastritis/small HH. + H.pylori, prescribed Pylera. Finished treatment.    ESOPHAGOGASTRODUODENOSCOPY N/A 12/13/2017   Dr. FiOneida Alaresophageal web s/p dilation, gastritis, no h.pylori   ESOPHAGOGASTRODUODENOSCOPY N/A 02/24/2018   Dr. FiOneida Alarweb in prox esophagus and in distal esophagus s/p dilation, mild gastritis, mild pyloric stenosis, mild post ulcer duodenal deformity at D1/D2   fatty tumor removal from lt groin     FRACTURE SURGERY Left    hand and left ring finger   GIVENS CAPSULE STUDY N/A 12/26/2017   incomplete   GIVENS  CAPSULE STUDY N/A 02/24/2018   normal small bowel   KNEE SURGERY     patella tendon repair june 2019 [facility]   LUNG CANCER SURGERY  12/2010   Left VATS, minithoracotomy, LLL superior segmentectomy   ORIF PATELLA Right 03/18/2018   Procedure: OPEN REDUCTION INTERNAL (ORIF) FIXATION RIGHT PATELLA;  Surgeon: HaCarole CivilMD;  Location: [facility];  Service: Orthopedics;  Laterality: Right;   ORIF WRIST FRACTURE Left 04/09/2014   Procedure: OPEN REDUCTION INTERNAL FIXATION (ORIF) WRIST FRACTURE;  Surgeon: TiRenette ButtersMD;  [place] [facility] Service: Orthopedics;  Laterality: Left;   PERCUTANEOUS PINNING Left 04/09/2014   Procedure: PERCUTANEOUS PINNING EXTREMITY;  Surgeon: TiRenette ButtersMD;  [place] [facility] Service: Orthopedics;  Laterality: Left;   SAVORY DILATION N/A 12/13/2017   Procedure: SAVORY DILATION;  Surgeon: FiDanie BinderMD;  [facility];  Service: Endoscopy;  Laterality: N/A;   TUBAL LIGATION     vocal cord surgery  02/06/2011   laryngoscopy with bilateral vocal cord Radiesse injection for vocal cord paralysis   YAG LASER APPLICATION Left 1222/29/7989  Procedure: YAG LASER APPLICATION;  Surgeon: Rutherford Guys, MD;  Location: [facility];  Service: Ophthalmology;  Laterality: Left;    SOCIAL HISTORY: Social History   Socioeconomic History   Marital status: Divorced    Spouse name: Not on file   Number of children: Not on file   Years of education: Not on file   Highest education level: Not on file  Occupational History   Not on file  Tobacco Use   Smoking status: Former    Packs/day: 0.50    Years: 20.00    Pack years: 10.00    Types: Cigarettes    Quit date: 12/21/2010    Years since quitting: 10.4   Smokeless tobacco: Never   Tobacco comments:    smoking cessation info given and reviewed   Vaping Use   Vaping Use: Never used  Substance and Sexual Activity   Alcohol use: Not Currently    Alcohol/week: 0.0 standard drinks   Drug use: No   Sexual  activity: Yes    Birth control/protection: Surgical  Other Topics Concern   Not on file  Social History Narrative   Divorced since 45.Lives alone with a dog.Marland KitchenRetired.  She receives Meals on Wheels.   Social Determinants of Health   Financial Resource Strain: Low Risk    Difficulty of Paying Living Expenses: Not hard at all  Food Insecurity: No Food Insecurity   Worried About Charity fundraiser in the Last Year: Never true   [place] in the Last Year: Never true  Transportation Needs: No Transportation Needs   Lack of Transportation (Medical): No   Lack of Transportation (Non-Medical): No  Physical Activity: Inactive   Days of Exercise per Week: 0 days   Minutes of Exercise per Session: 0 min  Stress: No Stress Concern Present   Feeling of Stress : Not at all  Social Connections: Socially Isolated   Frequency of Communication with Friends and Family: More than three times a week   Frequency of Social Gatherings with Friends and Family: More than three times a week   Attends Religious Services: Never   Marine scientist or Organizations: No   Attends Music therapist: Never   Marital Status: Divorced  Human resources officer Violence: Not At Risk   Fear of Current or Ex-Partner: No   Emotionally Abused: No   Physically Abused: No   Sexually Abused: No    FAMILY HISTORY: Family History  Problem Relation Age of Onset   Pulmonary embolism Mother    Heart attack Father    Hypertension Father    Liver cancer Sister    Cancer Sister    Cancer Brother    Cancer Daughter    Breast cancer Daughter    Colon cancer Neg Hx     ALLERGIES:  is allergic to ciprofloxacin hcl, sulfonamide derivatives, ciprofloxacin, iohexol, iodinated diagnostic agents, and prozac [fluoxetine hcl].  MEDICATIONS:  Current Outpatient Medications  Medication Sig Dispense Refill   albuterol (VENTOLIN HFA) 108 (90 Base) MCG/ACT inhaler INHALE 2 PUFFS EVERY 6 HOURS AS NEEDED FOR  WHEEZING OR SHORTNESS OF BREATH. 8.5 g 0   ALPRAZolam (XANAX) 0.5 MG tablet TAKE (1) TABLET BY MOUTH TWICE A DAY AS NEEDED. 60 tablet 0   citalopram (CELEXA) 40 MG tablet TAKE (1/2) TABLET BY MOUTH AT BEDTIME. 45 tablet 0   denosumab (PROLIA) 60 MG/ML SOSY injection INJECT INTO UPPER ARM, THIGH, OR ABDOMEN ONCE. 1 mL 0   diphenhydrAMINE (BENADRYL)  50 MG tablet Take 1 tablet (50 mg total) by mouth once for 1 dose. Take one hour prior to CT scan 1 tablet 0   ferrous sulfate 325 (65 FE) MG tablet Take 325 mg by mouth daily with breakfast.     fluticasone (FLONASE) 50 MCG/ACT nasal spray Place 2 sprays into both nostrils daily. 16 g 6   Fluticasone-Umeclidin-Vilant (TRELEGY ELLIPTA) 100-62.5-25 MCG/INH AEPB INHALE 1 PUFF INTO LUNGS DAILY. 60 each 6   gabapentin (NEURONTIN) 100 MG capsule TAKE 1 CAPSULE BY MOUTH THREE TIMES A DAY. 90 capsule 6   levocetirizine (XYZAL) 5 MG tablet TAKE ONE TABLET BY MOUTH ONCE DAILY. 30 tablet 6   levothyroxine (SYNTHROID) 50 MCG tablet TAKE 1 TABLET BEFORE BREAKFAST. 90 tablet 0   Multiple Vitamin (MULTIVITAMIN WITH MINERALS) TABS tablet Take 1 tablet by mouth daily.     ondansetron (ZOFRAN) 4 MG tablet TAKE 1 TABLET BY MOUTH 3 TIMES DAILY AS NEEDED FOR NAUSEA AND VOMITING. 90 tablet 0   osimertinib mesylate (TAGRISSO) 80 MG tablet TAKE 1 TABLET (80 MG TOTAL) BY MOUTH DAILY. 30 tablet 2   OVER THE COUNTER MEDICATION Bone Support 2-4 tabs daily     OXcarbazepine (TRILEPTAL) 150 MG tablet TAKE (1) TABLET BY MOUTH TWICE DAILY. 60 tablet 6   pantoprazole (PROTONIX) 40 MG tablet TAKE (1) TABLET BY MOUTH ONCE DAILY BEFORE BREAKFAST. 90 tablet 1   predniSONE (DELTASONE) 50 MG tablet Please take one tablet at 13 hours, 7 hours and one hour prior to CT scan 3 tablet 0   Probiotic Product (ALIGN PO) Take by mouth daily.     No current facility-administered medications for this visit.    REVIEW OF SYSTEMS:   Constitutional: ( - ) fevers, ( - )  chills , ( - ) night  sweats Eyes: ( - ) blurriness of vision, ( - ) double vision, ( - ) watery eyes Ears, nose, mouth, throat, and face: ( - ) mucositis, ( - ) sore throat Respiratory: ( - ) cough, ( - ) dyspnea, ( - ) wheezes Cardiovascular: ( - ) palpitation, ( - ) chest discomfort, ( - ) lower extremity swelling Gastrointestinal:  ( - ) nausea, ( - ) heartburn, ( - ) change in bowel habits Skin: ( - ) abnormal skin rashes Lymphatics: ( - ) new lymphadenopathy, ( - ) easy bruising Neurological: ( - ) numbness, ( - ) tingling, ( - ) new weaknesses Behavioral/Psych: ( - ) mood change, ( - ) new changes  All other systems were reviewed with the patient and are negative.  PHYSICAL EXAMINATION: Telephone Visit  LABORATORY DATA:  I have reviewed the data as listed CBC Latest Ref Rng & Units 06/14/2021 05/15/2021 04/17/2021  WBC 4.0 - 10.5 K/uL 5.8 5.4 4.5  Hemoglobin 12.0 - 15.0 g/dL 11.6(L) 11.8(L) 12.0  Hematocrit 36.0 - 46.0 % 34.1(L) 35.2(L) 34.8(L)  Platelets 150 - 400 K/uL 250 246 213    CMP Latest Ref Rng & Units 06/14/2021 05/15/2021 04/17/2021  Glucose 70 - 99 mg/dL 84 79 88  BUN 8 - 23 mg/dL _0 Creatinine 0.44 - 1.00 mg/dL 0.68 0.64 0.64  Sodium 135 - 145 mmol/L 128(L) 130(L) 131(L)  Potassium 3.5 - 5.1 mmol/L 4.6 4.6 4.2  Chloride 98 - 111 mmol/L 92(L) 94(L) 97(L)  CO2 22 - 32 mmol/L _1 Calcium 8.9 - 10.3 mg/dL 9.5 9.6 9.3  Total Protein 6.5 - 8.1 g/dL 6.5 6.2(L) 6.6  Total Bilirubin 0.3 - 1.2 mg/dL 0.4 0.4 0.3  Alkaline Phos 38 - 126 U/L 48 40 39  AST 15 - 41 U/L _0 ALT 0 - 44 U/L _1 RADIOGRAPHIC STUDIES:  MRI Brain 08/05/2020:   CLINICAL DATA:  Trigeminal neuralgia. Headache, cluster/trigeminal. Brain/CNS neoplasm, assess treatment response. Metastatic lung cancer. Additional history provided: SRS to 5 brain metastases 03/17/2019, left parietal craniotomy for tumor resection 03/18/2019, SRS to 2 additional metastases 01/01/2020.   EXAM: MRI HEAD WITHOUT AND  WITH CONTRAST   MRA HEAD WITHOUT CONTRAST   TECHNIQUE: Multiplanar, multiecho pulse sequences of the brain and surrounding structures were obtained without and with intravenous contrast. Angiographic images of the head were obtained using MRA technique without contrast.   CONTRAST:  1m MULTIHANCE GADOBENATE DIMEGLUMINE 529 MG/ML IV SOLN   COMPARISON:  Prior brain MRI examinations 04/30/2020 and earlier. MRA head 03/04/2019.   FINDINGS: MRI HEAD FINDINGS   Brain:   Stable generalized cerebral atrophy.   Again demonstrated is a hemosiderin stained left parietooccipital resection cavity. Irregular and gyriform enhancement has increased from the prior examination, now measuring up to 12 mm in thickness (for instance as seen on series 14, image 90) (series 15, image 9). This irregular enhancement extends to the site of a previously demonstrated 3 mm nodular focus of enhancement within the superolateral aspect of the cavity. Surrounding T2/FLAIR hyperintense signal abnormality has not significantly changed.   A previously demonstrated 4 mm lesion within the medial right cerebellum is no longer appreciated.   A 4 mm enhancing lesion within the left cerebellar hemisphere was present on the prior examination but seen to better advantage on today's study (series 14, image 48).   New 2 mm enhancing lesion more posteriorly within the medial left cerebellar hemisphere (series 14, image 50).   New 2 mm enhancing lesion within the lateral left cerebellum (series 14, image 42).   A 3 mm lesion within the high posterior right frontal lobe has decreased in size (series 14, image 145) (previously 5 mm).   A previously demonstrated 2 mm enhancing lesion within the left frontal lobe has increased in size, now measuring 8 mm (series 14, image 125) (series 11, image 39) . The lesion now demonstrates central T2/FLAIR hyperintensity and T1 hyperintensity with a peripheral low signal rim.  Precontrast T1 hyperintensity limits evaluation for enhancement at this site.   Multifocal T2/FLAIR hyperintensity elsewhere within the cerebral white matter and within the pons is nonspecific, but compatible chronic small vessel ischemic disease.   No evidence of acute infarction.   No extra-axial fluid collection.   No midline shift.   Vascular: Expected proximal arterial flow voids.   Skull and upper cervical spine: No focal suspicious marrow lesion.   Sinuses/Orbits: Visualized orbits show no acute finding. Trace ethmoid sinus mucosal thickening. No significant mastoid effusion.   MRA HEAD FINDINGS   The intracranial internal carotid arteries are patent. The M1 middle cerebral arteries are patent without significant stenosis. No M2 proximal branch occlusion or high-grade proximal stenosis is identified. The anterior cerebral arteries are patent. 1-2 mm inferiorly projecting vascular protrusion arising from the supraclinoid right ICA which may reflect an infundibulum or small aneurysm (series 103, image 12).   The intracranial vertebral arteries are patent. The basilar artery is patent. The posterior cerebral arteries are patent.   IMPRESSION: MRI brain:   1. Irregular and gyriform enhancement at site of the left parietooccipital resection cavity has increased,  now measuring up to 12 mm in thickness. This enhancement extends to the site of a previously demonstrated 3 mm nodular enhancing focus within the superolateral aspect of the cavity. Findings may reflect post-treatment inflammation or recurrent tumor. Short interval MRI follow-up is recommended. Surrounding T2 hyperintense signal abnormality has not appreciably changed. 2. New 2 mm metastasis within the posteromedial left cerebellar hemisphere. 3. New 2 mm metastasis within the lateral left cerebellum. 4. An additional 4 mm metastasis within the left cerebellum was present on the prior MRI of 04/30/2020, but  is seen to better advantage on today's study. 5. A previously demonstrated 4 mm metastasis within the medial right cerebellum is no longer appreciated 6. 8 mm metastasis within the paramedian left frontal lobe, increased in size and now demonstrating precontrast T1 hyperintensity which limits evaluation for enhancement. 7. A 3 mm lesion within the high posterior right frontal lobe has decreased in size.   MRA head:   1. No intracranial large vessel occlusion or proximal high-grade arterial stenosis. 2. 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     Electronically Signed   By: Kellie Simmering DO   On: 08/05/2020 13:49  CT CHEST ABDOMEN PELVIS W CONTRAST  Result Date: 05/18/2021 CLINICAL DATA:  Metastatic left lower lobe lung cancer, assess treatment response EXAM: CT CHEST, ABDOMEN, AND PELVIS WITH CONTRAST TECHNIQUE: Multidetector CT imaging of the chest, abdomen and pelvis was performed following the standard protocol during bolus administration of intravenous contrast. CONTRAST:  50m OMNIPAQUE IOHEXOL 350 MG/ML SOLN, additional oral enteric contrast COMPARISON:  02/14/2021 FINDINGS: CT CHEST FINDINGS Cardiovascular: Aortic atherosclerosis. Normal heart size. No pericardial effusion. Mediastinum/Nodes: No enlarged mediastinal, hilar, or axillary lymph nodes. Thyroid gland, trachea, and esophagus demonstrate no significant findings. Lungs/Pleura: Severe centrilobular emphysema. Redemonstrated postoperative findings of left lower lobe superior segmentectomy. No pleural effusion or pneumothorax. Musculoskeletal: No chest wall mass or suspicious bone lesions identified. Osteopenia. Numerous wedge deformities of the thoracic spine, not significantly changed, including of T6, T9, T12. Posterior thoracolumbar fusion from T10 through L2. CT ABDOMEN PELVIS FINDINGS Hepatobiliary: No focal liver abnormality is seen. Status post cholecystectomy. No biliary  dilatation. Pancreas: Unremarkable. No pancreatic ductal dilatation or surrounding inflammatory changes. Spleen: Normal in size without significant abnormality. Adrenals/Urinary Tract: Adrenal glands are unremarkable. Kidneys are normal, without renal calculi, solid lesion, or hydronephrosis. Bladder is unremarkable. Stomach/Bowel: Stomach is within normal limits. Appendix is not clearly visualized and may be surgically absent. No evidence of bowel wall thickening, distention, or inflammatory changes. Vascular/Lymphatic: Aortic atherosclerosis. Tortuous abdominal aorta with aneurysm of the infrarenal abdominal aorta, measuring up to 3.4 x 3.1 cm, unchanged (series 6, image 84, series 2, image 61). 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 No enlarged abdominal or pelvic lymph nodes. Reproductive: Status post hysterectomy. Other: No abdominal wall hernia or abnormality. No abdominopelvic ascites. Musculoskeletal: No acute or significant osseous findings. IMPRESSION: 1. Redemonstrated postoperative findings of left lower lobe superior segmentectomy. 2. No evidence of locally recurrent or metastatic disease in the chest, abdomen, or pelvis. 3. Severe emphysema. 4. Tortuous abdominal aorta with aneurysm of the infrarenal abdominal aorta, measuring up to 3.4 x 3.1 cm, unchanged. 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 5. Osteopenia with numerous wedge deformities of the thoracic spine, not significantly changed. Posterior thoracolumbar fusion from T10 through  L2. Aortic Atherosclerosis (ICD10-I70.0) and Emphysema (ICD10-J43.9). Electronically Signed   By: Eddie Candle M.D.   On: 05/18/2021 16:11     ASSESSMENT & PLAN Lamekia S Tindall 80 y.o. female with medical history significant for metastatic adenocarcinoma of the lung with brain metastasis who  presents for a follow up visit.  After review the records, review of the labs, review the imaging, discussion with the patient the findings are most consistent with a stage IV adenocarcinoma of the lung with a small lung lesion and metastatic spread to the brain.  Her metastatic brain mets have been treated with resection of tumor on 03/18/2019 as well as SRS on 12/12/2019.  Most recently she was found to have 2 new subcentimeter lesions and underwent stereotactic radiation therapy which was completed on 05/20/2020. Unfortunately on 08/05/2020 she was noted to have new CNS metastatic disease.   There are limitations in what therapies can be used for this patient given her poor functional status.  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  After extensive discussions with the patient's about the risks and benefits of this treatment she was agreeable to starting osimertinib 80 mg p.o. daily for her metastatic adenocarcinoma of the lung. She started this treatment on 09/05/2020.  On discussion today [name] is tolerating Tagrisso well and is willing and able to proceed at this time.  Restaging CT scans are planned for November 2022.   #Stage IV ([id number]) adenocarcinoma of the lung: -Left lower lobe resection on 12/21/2010, 14 lymph nodes negative, 2.2 cm tumor size, margins negative. -most recent MRI brain scan shows stable disease. No evidence of Chest/abdomen involvement on scan from 05/15/2021.  --started therapy with osimertinib 80 mg PO daily for her EGFR Exon 19 mutation. She started on 09/05/2020 -CT chest with contrast on 05/18/2021 showed no active disease in the chest. Continue CT scans q 3 months.  -PD-L1 testing was 1%. -- Patient is a poor candidate for systemic therapy, though she does have an EGFR Exon 19 mutation.  --RTC in 4 weeks time to assure continued tolerance of osimirtenib.   #Hand Foot Syndrome, resolved --previously noted to have  desquamation and erythema of finger tips. Resolved on exam today.  --held therapy for 1-2 weeks until resolution --patient had resolution of symptoms after 2 weeks off therapy. Encouraged continue aggressive moisturizing lotion.    #Brain metastasis, stable -Presentation with expressive aphasia to ER on 03/03/2019 along with right-sided weakness.  Found to have multiple brain metastasis and underwent SRS on 03/17/2019. 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 -Underwent SRS on 01/01/2020 for 2 subcentimeter brain lesions. -Brain MRI on 04/30/2020 showed 2 new subcentimeter meta stasis in the right cerebellum and right frontal lobe. -Stereotactic radiation therapy was completed on 05/20/2020 --most recent MRI on 05/12/2021 showed stable disease. Continue systemic therapy as above.   Orders Placed This Encounter  Procedures   CT CHEST ABDOMEN PELVIS W CONTRAST    Standing Status:   Future    Standing Expiration Date:   06/14/2022    Order Specific Question:   If indicated for the ordered procedure, I authorize the administration of contrast media per Radiology protocol    Answer:   Yes    Order Specific Question:   Preferred imaging location?    Answer:   [facility]    Order Specific Question:   Release to  patient    Answer:   Immediate    Order Specific Question:   Is Oral Contrast requested for this exam?    Answer:   Yes, Per Radiology protocol    Order Specific Question:   Reason for Exam (SYMPTOM  OR DIAGNOSIS REQUIRED)    Answer:   lung cancer, assess for recurrence/progression     All questions were answered. The patient knows to call the clinic with any problems, questions or concerns.  A total of more than 30 minutes were spent on this encounter and over half of that time was spent on counseling and coordination of care as outlined above.   Ledell Peoples, MD Department of  Hematology/Oncology [place] at [facility] Phone: [phone] Pager: [phone] Email: [email or web address]  06/14/2021 11:55 AM    Suszanne Conners HA, Su JM, Chinle SH, Rowe Pavy, [place], [place]. 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 J Thorac Oncol. 2020 Nov;15(11):1758-1766. doi: 10.1016/j.jtho.2020.06.018. Epub 2020 Jul 9. PMID: [id number].  --Osimertinib is a promising treatment option for EGFR-mutated NSCLC with LM regardless of T790M mutational status.

## 2021-06-20 ENCOUNTER — Telehealth: Payer: Self-pay | Admitting: Pharmacist

## 2021-06-20 ENCOUNTER — Encounter: Payer: Self-pay | Admitting: Family Medicine

## 2021-06-20 NOTE — Progress Notes (Addendum)
Chronic Care Management Pharmacy Assistant   Name: [name]  MRN: [id number] DOB: 05-09-1941  Reason for Encounter: General Disease State Call   Conditions to be addressed/monitored: HTN. Adenocarcinoma of lung w/ brain metastases, COPD, HLD.  Recent office visits:  None since 03/20/21  Recent consult visits:  06/14/21 Oncology Orson Slick, MD. For follow-up. No medication changes.  05/18/21 Oncology Vaslow, Acey Lav, MD. For brian metastases. No medication changes.  05/15/21 Oncology Orson Slick, MD. For follow-up. No medication changes.  04/17/21 Oncology Orson Slick, MD. For follow-up. No medication changes.  04/11/21 Ophthalmology Hyman Hopes, MD For eye exam.  03/22/21 Oncology Orson Slick, MD. For follow-up. No medication changes.   Hospital visits:  None since 03/20/21.  Medications: Outpatient Encounter Medications as of 06/20/2021  Medication Sig   albuterol (VENTOLIN HFA) 108 (90 Base) MCG/ACT inhaler INHALE 2 PUFFS EVERY 6 HOURS AS NEEDED FOR WHEEZING OR SHORTNESS OF BREATH.   ALPRAZolam (XANAX) 0.5 MG tablet TAKE (1) TABLET BY MOUTH TWICE A DAY AS NEEDED.   citalopram (CELEXA) 40 MG tablet TAKE (1/2) TABLET BY MOUTH AT BEDTIME.   denosumab (PROLIA) 60 MG/ML SOSY injection INJECT INTO UPPER ARM, THIGH, OR ABDOMEN ONCE.   diphenhydrAMINE (BENADRYL) 50 MG tablet Take 1 tablet (50 mg total) by mouth once for 1 dose. Take one hour prior to CT scan   ferrous sulfate 325 (65 FE) MG tablet Take 325 mg by mouth daily with breakfast.   fluticasone (FLONASE) 50 MCG/ACT nasal spray Place 2 sprays into both nostrils daily.   Fluticasone-Umeclidin-Vilant (TRELEGY ELLIPTA) 100-62.5-25 MCG/INH AEPB INHALE 1 PUFF INTO LUNGS DAILY.   gabapentin (NEURONTIN) 100 MG capsule TAKE 1 CAPSULE BY MOUTH THREE TIMES A DAY.   levocetirizine (XYZAL) 5 MG tablet TAKE ONE TABLET BY MOUTH ONCE DAILY.   levothyroxine (SYNTHROID) 50 MCG tablet TAKE 1 TABLET BEFORE  BREAKFAST.   Multiple Vitamin (MULTIVITAMIN WITH MINERALS) TABS tablet Take 1 tablet by mouth daily.   ondansetron (ZOFRAN) 4 MG tablet TAKE 1 TABLET BY MOUTH 3 TIMES DAILY AS NEEDED FOR NAUSEA AND VOMITING.   osimertinib mesylate (TAGRISSO) 80 MG tablet TAKE 1 TABLET (80 MG TOTAL) BY MOUTH DAILY.   OVER THE COUNTER MEDICATION Bone Support 2-4 tabs daily   OXcarbazepine (TRILEPTAL) 150 MG tablet TAKE (1) TABLET BY MOUTH TWICE DAILY.   pantoprazole (PROTONIX) 40 MG tablet TAKE (1) TABLET BY MOUTH ONCE DAILY BEFORE BREAKFAST.   predniSONE (DELTASONE) 50 MG tablet Please take one tablet at 13 hours, 7 hours and one hour prior to CT scan   Probiotic Product (ALIGN PO) Take by mouth daily.   No facility-administered encounter medications on file as of 06/20/2021.   GEN CALL: Patient stated its getting really painful to walk and she can hardly stand up. She stated her back pain is increasing. She stated she needs her Ondansetron medication refilled, patient thinks she threw her new bottle in the trash, Requested today. Patient stated she is controlling her allergies at this time. Patient stated she eats well. She stated she drinks protein drinks regularly. She stated she eats plenty of green vegetables and drinks plenty of water. Patient stated she would like for [name] to look at her most recent sodium labs and see if there is something that needs to be done.   Care Gaps: None.   Star Rating Drugs: N/A.   Follow-Up:Pharmacist Review  Charlann Lange, [place] Pharmacist Assistant  [phone]  10 minutes spent in review, coordination, and documentation.  Reviewed labs, stable until she sees Dr. Dennard Schaumann 09/22.  Reviewed by: Beverly Milch, PharmD Clinical Pharmacist [phone]

## 2021-06-20 NOTE — Telephone Encounter (Signed)
Call placed to patient to follow up.   Was advised that she does have some congestion and productive cough with thin clear mucus. States that mucinex is helping to bring congestion out of chest.   Denies SOB or increased work of breathing. Denies chest pain.    Advised if Sx worsen, contact office for appointment.   States that she is scheduled for nurse visit for prolia injection.

## 2021-06-26 ENCOUNTER — Other Ambulatory Visit: Payer: Self-pay | Admitting: Nurse Practitioner

## 2021-06-26 ENCOUNTER — Other Ambulatory Visit: Payer: Self-pay | Admitting: Family Medicine

## 2021-06-27 NOTE — Telephone Encounter (Signed)
Last date filled 05/01/21

## 2021-06-29 ENCOUNTER — Ambulatory Visit: Payer: PPO

## 2021-06-29 DIAGNOSIS — D496 Neoplasm of unspecified behavior of brain: Secondary | ICD-10-CM | POA: Diagnosis not present

## 2021-06-29 DIAGNOSIS — G8929 Other chronic pain: Secondary | ICD-10-CM | POA: Diagnosis not present

## 2021-06-29 DIAGNOSIS — M545 Low back pain, unspecified: Secondary | ICD-10-CM | POA: Diagnosis not present

## 2021-07-01 ENCOUNTER — Encounter: Payer: Self-pay | Admitting: Hematology and Oncology

## 2021-07-01 ENCOUNTER — Encounter: Payer: Self-pay | Admitting: Internal Medicine

## 2021-07-01 ENCOUNTER — Encounter: Payer: Self-pay | Admitting: Family Medicine

## 2021-07-02 DIAGNOSIS — C3490 Malignant neoplasm of unspecified part of unspecified bronchus or lung: Secondary | ICD-10-CM | POA: Diagnosis not present

## 2021-07-02 DIAGNOSIS — J9621 Acute and chronic respiratory failure with hypoxia: Secondary | ICD-10-CM | POA: Diagnosis not present

## 2021-07-02 DIAGNOSIS — J449 Chronic obstructive pulmonary disease, unspecified: Secondary | ICD-10-CM | POA: Diagnosis not present

## 2021-07-02 DIAGNOSIS — R0902 Hypoxemia: Secondary | ICD-10-CM | POA: Diagnosis not present

## 2021-07-03 ENCOUNTER — Encounter: Payer: Self-pay | Admitting: Hematology and Oncology

## 2021-07-03 ENCOUNTER — Telehealth: Payer: Self-pay | Admitting: Pharmacist

## 2021-07-03 NOTE — Progress Notes (Signed)
    Chronic Care Management Pharmacy Assistant   Name: [name]  MRN: [id number] DOB: 11/18/1940  Reason for Encounter: General Disease State Call   Conditions to be addressed/monitored: HTN. Adenocarcinoma of lung w/ brain metastases, COPD, HLD.  Recent office visits:  None since 06/20/21  Recent consult visits:  None since 06/20/21  Hospital visits:  None since 06/20/21  Medications: Outpatient Encounter Medications as of 07/03/2021  Medication Sig   albuterol (VENTOLIN HFA) 108 (90 Base) MCG/ACT inhaler INHALE 2 PUFFS EVERY 6 HOURS AS NEEDED FOR WHEEZING OR SHORTNESS OF BREATH.   ALPRAZolam (XANAX) 0.5 MG tablet TAKE (1) TABLET BY MOUTH TWICE A DAY AS NEEDED.   citalopram (CELEXA) 40 MG tablet TAKE (1/2) TABLET BY MOUTH AT BEDTIME.   denosumab (PROLIA) 60 MG/ML SOSY injection INJECT INTO UPPER ARM, THIGH, OR ABDOMEN ONCE.   diphenhydrAMINE (BENADRYL) 50 MG tablet Take 1 tablet (50 mg total) by mouth once for 1 dose. Take one hour prior to CT scan   ferrous sulfate 325 (65 FE) MG tablet Take 325 mg by mouth daily with breakfast.   fluticasone (FLONASE) 50 MCG/ACT nasal spray Place 2 sprays into both nostrils daily.   Fluticasone-Umeclidin-Vilant (TRELEGY ELLIPTA) 100-62.5-25 MCG/INH AEPB INHALE 1 PUFF INTO LUNGS DAILY.   gabapentin (NEURONTIN) 100 MG capsule TAKE 1 CAPSULE BY MOUTH THREE TIMES A DAY.   levocetirizine (XYZAL) 5 MG tablet TAKE ONE TABLET BY MOUTH ONCE DAILY.   levothyroxine (SYNTHROID) 50 MCG tablet TAKE 1 TABLET BEFORE BREAKFAST.   Multiple Vitamin (MULTIVITAMIN WITH MINERALS) TABS tablet Take 1 tablet by mouth daily.   ondansetron (ZOFRAN) 4 MG tablet TAKE 1 TABLET BY MOUTH 3 TIMES DAILY AS NEEDED FOR NAUSEA AND VOMITING.   osimertinib mesylate (TAGRISSO) 80 MG tablet TAKE 1 TABLET (80 MG TOTAL) BY MOUTH DAILY.   OVER THE COUNTER MEDICATION Bone Support 2-4 tabs daily   OXcarbazepine (TRILEPTAL) 150 MG tablet TAKE (1) TABLET BY MOUTH TWICE DAILY.    pantoprazole (PROTONIX) 40 MG tablet TAKE (1) TABLET BY MOUTH ONCE DAILY BEFORE BREAKFAST.   predniSONE (DELTASONE) 50 MG tablet Please take one tablet at 13 hours, 7 hours and one hour prior to CT scan   Probiotic Product (ALIGN PO) Take by mouth daily.   No facility-administered encounter medications on file as of 07/03/2021.   GEN CALL: Called the patient to ensure she did not need any further assistance with her medications and/or labs. She stated everything is normal as before and she doesn't have any questions about her medications at this time. She stated her 9/22 appointment was r/s but she will make her 10/5 appointment.   Care Gaps:None.  Star Rating Drugs:N/A.  Follow-Up:Pharmacist Review  Charlann Lange, [place] Pharmacist Assistant [phone]

## 2021-07-04 ENCOUNTER — Other Ambulatory Visit: Payer: Self-pay | Admitting: Family Medicine

## 2021-07-04 ENCOUNTER — Other Ambulatory Visit (HOSPITAL_COMMUNITY): Payer: Self-pay

## 2021-07-05 ENCOUNTER — Other Ambulatory Visit (HOSPITAL_COMMUNITY): Payer: Self-pay

## 2021-07-05 DIAGNOSIS — M546 Pain in thoracic spine: Secondary | ICD-10-CM | POA: Insufficient documentation

## 2021-07-05 DIAGNOSIS — M542 Cervicalgia: Secondary | ICD-10-CM | POA: Insufficient documentation

## 2021-07-05 DIAGNOSIS — M25551 Pain in right hip: Secondary | ICD-10-CM | POA: Insufficient documentation

## 2021-07-05 DIAGNOSIS — G8929 Other chronic pain: Secondary | ICD-10-CM | POA: Insufficient documentation

## 2021-07-05 DIAGNOSIS — M25561 Pain in right knee: Secondary | ICD-10-CM | POA: Insufficient documentation

## 2021-07-11 ENCOUNTER — Telehealth: Payer: Self-pay

## 2021-07-11 ENCOUNTER — Other Ambulatory Visit (HOSPITAL_COMMUNITY): Payer: Self-pay

## 2021-07-11 NOTE — Telephone Encounter (Signed)
[name], MD to [name]    I am sorry to hear you ar moving but I wish you all the best.  I do not personally know a doctor in [place] so I would suggest getting references from friends or family in the area.   Gershon Mussel

## 2021-07-11 NOTE — Telephone Encounter (Signed)
Pt called in stated that she will be moving to [place], [place] soon. Pt stated that she would like to get a referral for a pcp in that area, preferably the [place] area. Please advise.  Cb#: [phone]

## 2021-07-11 NOTE — Telephone Encounter (Signed)
Pt called and states she has to move with her grand daughter to [place], [place] where she will be taken care of, and asks if Dr [name] could refer her to an oncologist at [facility]. Pt is seeing MD 10/5 and would like to know his recommendation upon visit.

## 2021-07-12 ENCOUNTER — Other Ambulatory Visit: Payer: Self-pay

## 2021-07-12 ENCOUNTER — Inpatient Hospital Stay: Payer: PPO | Attending: Internal Medicine | Admitting: Hematology and Oncology

## 2021-07-12 ENCOUNTER — Other Ambulatory Visit (HOSPITAL_COMMUNITY): Payer: Self-pay

## 2021-07-12 ENCOUNTER — Ambulatory Visit (INDEPENDENT_AMBULATORY_CARE_PROVIDER_SITE_OTHER): Payer: PPO | Admitting: *Deleted

## 2021-07-12 ENCOUNTER — Inpatient Hospital Stay: Payer: PPO

## 2021-07-12 VITALS — BP 125/59 | HR 65 | Temp 97.9°F | Resp 18 | Wt 104.8 lb

## 2021-07-12 DIAGNOSIS — C3492 Malignant neoplasm of unspecified part of left bronchus or lung: Secondary | ICD-10-CM | POA: Diagnosis not present

## 2021-07-12 DIAGNOSIS — C7931 Secondary malignant neoplasm of brain: Secondary | ICD-10-CM | POA: Diagnosis not present

## 2021-07-12 DIAGNOSIS — M810 Age-related osteoporosis without current pathological fracture: Secondary | ICD-10-CM

## 2021-07-12 DIAGNOSIS — Z23 Encounter for immunization: Secondary | ICD-10-CM

## 2021-07-12 DIAGNOSIS — E039 Hypothyroidism, unspecified: Secondary | ICD-10-CM | POA: Diagnosis not present

## 2021-07-12 DIAGNOSIS — Z780 Asymptomatic menopausal state: Secondary | ICD-10-CM

## 2021-07-12 DIAGNOSIS — C3432 Malignant neoplasm of lower lobe, left bronchus or lung: Secondary | ICD-10-CM | POA: Diagnosis not present

## 2021-07-12 LAB — CMP (CANCER CENTER ONLY)
ALT: 12 U/L (ref 0–44)
AST: 21 U/L (ref 15–41)
Albumin: 3.9 g/dL (ref 3.5–5.0)
Alkaline Phosphatase: 58 U/L (ref 38–126)
Anion gap: 9 (ref 5–15)
BUN: 8 mg/dL (ref 8–23)
CO2: 27 mmol/L (ref 22–32)
Calcium: 9 mg/dL (ref 8.9–10.3)
Chloride: 90 mmol/L — ABNORMAL LOW (ref 98–111)
Creatinine: 0.61 mg/dL (ref 0.44–1.00)
GFR, Estimated: >60 mL/min
Glucose, Bld: 73 mg/dL (ref 70–99)
Potassium: 4.3 mmol/L (ref 3.5–5.1)
Sodium: 126 mmol/L — ABNORMAL LOW (ref 135–145)
Total Bilirubin: 0.3 mg/dL (ref 0.3–1.2)
Total Protein: 6.4 g/dL — ABNORMAL LOW (ref 6.5–8.1)

## 2021-07-12 LAB — CBC WITH DIFFERENTIAL (CANCER CENTER ONLY)
Abs Immature Granulocytes: 0.01 K/uL (ref 0.00–0.07)
Basophils Absolute: 0 K/uL (ref 0.0–0.1)
Basophils Relative: 1 %
Eosinophils Absolute: 0.1 K/uL (ref 0.0–0.5)
Eosinophils Relative: 2 %
HCT: 32.6 % — ABNORMAL LOW (ref 36.0–46.0)
Hemoglobin: 10.9 g/dL — ABNORMAL LOW (ref 12.0–15.0)
Immature Granulocytes: 0 %
Lymphocytes Relative: 10 %
Lymphs Abs: 0.4 K/uL — ABNORMAL LOW (ref 0.7–4.0)
MCH: 29.3 pg (ref 26.0–34.0)
MCHC: 33.4 g/dL (ref 30.0–36.0)
MCV: 87.6 fL (ref 80.0–100.0)
Monocytes Absolute: 0.5 K/uL (ref 0.1–1.0)
Monocytes Relative: 11 %
Neutro Abs: 3.3 K/uL (ref 1.7–7.7)
Neutrophils Relative %: 76 %
Platelet Count: 222 K/uL (ref 150–400)
RBC: 3.72 MIL/uL — ABNORMAL LOW (ref 3.87–5.11)
RDW: 12.8 % (ref 11.5–15.5)
WBC Count: 4.3 K/uL (ref 4.0–10.5)
nRBC: 0 % (ref 0.0–0.2)

## 2021-07-12 MED ORDER — DENOSUMAB 60 MG/ML ~~LOC~~ SOSY
60.0000 mg | PREFILLED_SYRINGE | Freq: Once | SUBCUTANEOUS | Status: AC
  Administered 2021-07-12: 60 mg via SUBCUTANEOUS

## 2021-07-12 NOTE — Progress Notes (Signed)
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 0/14 lymph nodes.  No perineural invasion or LVI.  negative margins 2) 03/03/2019: presented to ED with expressive aphasia, found to have multiple brain metastasis and underwent SRS on 03/17/2019. 3) 03/18/2019: Stereotactic left parietal craniotomy for resection of tumor 4) 04/30/2020: Brain MRI on showed 2 new subcentimeter meta stasis in the right cerebellum and right frontal lobe. 5)  05/20/2020: Stereotactic radiation therapy was completed 6) 06/23/2020: establish care with Dr. [name]  7) 08/29/2020: prescribed osimirtinib therapy due to next metastatic disease in the brain.  8) 09/05/2020: started osimirtinib 45m PO daily 9) 10/19/2020: CT Chest showed no evidence of residual disease 10) 11/21/2020: desquamation and erythema of finger tips. Concern for HFS. Temporarily holding therapy  11) 12/05/2020: restarted Tagrisso after resolution of the HFS symptoms  Interval History:  [name] 80y.o. female with medical history significant for metastatic adenocarcinoma of the lung with brain metastasis who presents for a follow up visit. The patient's last visit was on 06/14/2021.  In the interim since the last visit the patient has continued on her Tagrisso with no major isuses.   On exam today [name] [place]  We  will make arrangements to connect her to the cancer center at that location.  She reports her hands are doing quite well and she is not having any dryness or cracking.  She notes that she is tolerating the Tagrisso therapy quite well with no new symptoms in the interim since her last visit.  Her energy levels are baseline and her appetite has been good.  Her weight has been stable. She is at her baseline level of 2 L of oxygen.  She otherwise has been quite well and is willing and able to proceed with osimertinib therapy.  She currently denies any fevers, chills, sweats, nausea vomiting or diarrhea.  A full ten point ROS is listed below.  MEDICAL HISTORY:  Past Medical History:  Diagnosis Date   [place] ([facility] 12/2010   left lung/surg only   Allergic rhinitis    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[facility]    corrected by surgery 08/21/13   Anxiety disorder    Aortic aneurysm ([facility] [place]    Brain metastasis ([facility]    lung cancer s/p resection 7/20   Cancer ([facility]    Phreesia 091/79/1505  Complication of anesthesia 2011   bloodpressure dropped during colonoscopy, not problems since   Compression fracture    COPD (chronic obstructive pulmonary disease) (HCC)    Depression    Diastolic dysfunction    Diverticulosis    Dysphagia    Emphysema lung ([facility] 11/08/2014   Headache    Hiatal hernia    History of kidney stones    Hypothyroidism    IBS (irritable bowel syndrome)    Iron deficiency anemia due to chronic blood loss 12/05/2016   On home O2 12/15/2013   chronic hypoxia   Pneumonia    PUD (peptic  ulcer disease)    59yrs   Shortness of breath    with exertion   SIADH (syndrome of inappropriate ADH production) (HCC)    Trigeminal neuralgia     SURGICAL HISTORY: Past Surgical History:  Procedure Laterality Date   ABDOMINAL HYSTERECTOMY     APPLICATION OF CRANIAL NAVIGATION N/A 03/18/2019   Procedure: APPLICATION OF CRANIAL NAVIGATION;  Surgeon: Consuella Lose, MD;  Location: [place];   Service: Neurosurgery;  Laterality: N/A;   BACK SURGERY  January 13, 2014   BRAIN SURGERY N/A    Phreesia 03/28/2020   CATARACT EXTRACTION     CATARACT EXTRACTION W/PHACO  09/02/2012   Procedure: CATARACT EXTRACTION PHACO AND INTRAOCULAR LENS PLACEMENT (IOC);  Surgeon: Elta Guadeloupe T. 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 Oneida Alar: moderate diverticulosis, hemorrhoids, redundant colon. next TCS in 10-15 years.    CRANIOTOMY Left 03/18/2019   Procedure: Left stereotactic craniotomy for tumor resection;  Surgeon: Consuella Lose, MD;  Location: [place];  Service: Neurosurgery;  Laterality: Left;  Left stereotactic craniotomy for tumor resection   ENDARTERECTOMY Right 08/21/2013   Procedure: RIGHT CAROTID ANEURYSM RESECTION;  Surgeon: Rosetta Posner, MD;  Location: [place];  Service: Vascular;  Laterality: Right;   ESOPHAGEAL DILATION  02/24/2018   Procedure: ESOPHAGEAL DILATION;  Surgeon: Danie Binder, MD;  [facility];  Service: Endoscopy;;   ESOPHAGOGASTRODUODENOSCOPY  11/13/2009   w/dilation to 45mm, gastric ulceration (H.Pylori) s/p treatment   ESOPHAGOGASTRODUODENOSCOPY  11/21/2009   distal esophageal web, gastritis   ESOPHAGOGASTRODUODENOSCOPY  03/14/12   ULA:GTXMIWOEH in the distal esophagus/Mild gastritis/small HH. + H.pylori, prescribed Pylera. Finished treatment.    ESOPHAGOGASTRODUODENOSCOPY N/A 12/13/2017   Dr. Oneida Alar: esophageal web s/p dilation, gastritis, no h.pylori   ESOPHAGOGASTRODUODENOSCOPY N/A 02/24/2018   Dr. Oneida Alar: web in prox esophagus and in distal esophagus s/p dilation, mild gastritis, mild pyloric stenosis, mild post ulcer duodenal deformity at D1/D2   fatty tumor removal from lt groin     FRACTURE SURGERY Left    hand and left ring finger   GIVENS CAPSULE STUDY N/A 12/26/2017   incomplete    GIVENS CAPSULE STUDY N/A 02/24/2018   normal small bowel   KNEE SURGERY     patella tendon repair june 2019 [facility]   LUNG CANCER SURGERY  12/2010   Left VATS, minithoracotomy, LLL superior segmentectomy   ORIF PATELLA Right 03/18/2018   Procedure: OPEN REDUCTION INTERNAL (ORIF) FIXATION RIGHT PATELLA;  Surgeon: Carole Civil, MD;  Location[facility];  Service: Orthopedics;  Laterality: Right;   ORIF WRIST FRACTURE Left 04/09/2014   Procedure: OPEN REDUCTION INTERNAL FIXATION (ORIF) WRIST FRACTURE;  Surgeon: Renette Butters, MD;  Location: [place] [facility];  Service: Orthopedics;  Laterality: Left;   PERCUTANEOUS PINNING Left 04/09/2014   Procedure: PERCUTANEOUS PINNING EXTREMITY;  Surgeon: Renette Butters, MD;  Location: [place];  Service: Orthopedics;  Laterality: Left;   SAVORY DILATION N/A 12/13/2017   Procedure: SAVORY DILATION;  Surgeon: Danie Binder, MD;  [facility];  Service: Endoscopy;  Laterality: N/A;   TUBAL LIGATION     vocal cord surgery  02/06/2011   laryngoscopy with bilateral vocal cord Radiesse injection for vocal cord paralysis   YAG LASER APPLICATION Left 21/22/4825   Procedure: YAG LASER APPLICATION;  Surgeon: Elta Guadeloupe  Gershon Crane, MD;  Location[facility];  Service: Ophthalmology;  Laterality: Left;    SOCIAL HISTORY: Social History   Socioeconomic History   Marital status: Divorced    Spouse name: Not on file   Number of children: Not on file   Years of education: Not on file   Highest education level: Not on file  Occupational History   Not on file  Tobacco Use   Smoking status: Former    Packs/day: 0.50    Years: 20.00    Pack years: 10.00    Types: Cigarettes    Quit date: 12/21/2010    Years since quitting: 10.5   Smokeless tobacco: Never   Tobacco comments:    smoking cessation info given and reviewed   Vaping Use   Vaping Use: Never used  Substance and Sexual Activity   Alcohol use: Not Currently    Alcohol/week: 0.0 standard drinks   Drug use: No    Sexual activity: Yes    Birth control/protection: Surgical  Other Topics Concern   Not on file  Social History Narrative   Divorced since 38.Lives alone with a dog.Marland KitchenRetired.  She receives Meals on Wheels.   Social Determinants of Health   Financial Resource Strain: Low Risk    Difficulty of Paying Living Expenses: Not hard at all  Food Insecurity: No Food Insecurity   Worried About Charity fundraiser in the Last Year: Never true   [place] in the Last Year: Never true  Transportation Needs: No Transportation Needs   Lack of Transportation (Medical): No   Lack of Transportation (Non-Medical): No  Physical Activity: Inactive   Days of Exercise per Week: 0 days   Minutes of Exercise per Session: 0 min  Stress: No Stress Concern Present   Feeling of Stress : Not at all  Social Connections: Socially Isolated   Frequency of Communication with Friends and Family: More than three times a week   Frequency of Social Gatherings with Friends and Family: More than three times a week   Attends Religious Services: Never   Marine scientist or Organizations: No   Attends Music therapist: Never   Marital Status: Divorced  Human resources officer Violence: Not At Risk   Fear of Current or Ex-Partner: No   Emotionally Abused: No   Physically Abused: No   Sexually Abused: No    FAMILY HISTORY: Family History  Problem Relation Age of Onset   Pulmonary embolism Mother    Heart attack Father    Hypertension Father    Liver cancer Sister    Cancer Sister    Cancer Brother    Cancer Daughter    Breast cancer Daughter    Colon cancer Neg Hx     ALLERGIES:  is allergic to ciprofloxacin hcl, sulfonamide derivatives, ciprofloxacin, iohexol, iodinated diagnostic agents, and prozac [fluoxetine hcl].  MEDICATIONS:  Current Outpatient Medications  Medication Sig Dispense Refill   albuterol (VENTOLIN HFA) 108 (90 Base) MCG/ACT inhaler INHALE 2 PUFFS EVERY 6 HOURS AS NEEDED  FOR WHEEZING OR SHORTNESS OF BREATH. 8.5 g 0   ALPRAZolam (XANAX) 0.5 MG tablet TAKE (1) TABLET BY MOUTH TWICE A DAY AS NEEDED. 60 tablet 0   citalopram (CELEXA) 40 MG tablet TAKE (1/2) TABLET BY MOUTH AT BEDTIME. 45 tablet 0   denosumab (PROLIA) 60 MG/ML SOSY injection INJECT INTO UPPER ARM, THIGH, OR ABDOMEN ONCE. 1 mL 0   diphenhydrAMINE (BENADRYL) 50 MG tablet Take 1 tablet (50  mg total) by mouth once for 1 dose. Take one hour prior to CT scan 1 tablet 0   ferrous sulfate 325 (65 FE) MG tablet Take 325 mg by mouth daily with breakfast.     fluticasone (FLONASE) 50 MCG/ACT nasal spray Place 2 sprays into both nostrils daily. 16 g 6   Fluticasone-Umeclidin-Vilant (TRELEGY ELLIPTA) 100-62.5-25 MCG/INH AEPB INHALE 1 PUFF INTO LUNGS DAILY. 60 each 6   gabapentin (NEURONTIN) 100 MG capsule TAKE 1 CAPSULE BY MOUTH THREE TIMES A DAY. 90 capsule 0   levocetirizine (XYZAL) 5 MG tablet TAKE ONE TABLET BY MOUTH ONCE DAILY. 30 tablet 0   levothyroxine (SYNTHROID) 50 MCG tablet TAKE 1 TABLET BEFORE BREAKFAST. 90 tablet 0   Multiple Vitamin (MULTIVITAMIN WITH MINERALS) TABS tablet Take 1 tablet by mouth daily.     ondansetron (ZOFRAN) 4 MG tablet TAKE 1 TABLET BY MOUTH 3 TIMES DAILY AS NEEDED FOR NAUSEA AND VOMITING. 90 tablet 0   osimertinib mesylate (TAGRISSO) 80 MG tablet TAKE 1 TABLET (80 MG TOTAL) BY MOUTH DAILY. 30 tablet 2   OVER THE COUNTER MEDICATION Bone Support 2-4 tabs daily     OXcarbazepine (TRILEPTAL) 150 MG tablet TAKE (1) TABLET BY MOUTH TWICE DAILY. 60 tablet 0   pantoprazole (PROTONIX) 40 MG tablet TAKE (1) TABLET BY MOUTH ONCE DAILY BEFORE BREAKFAST. 90 tablet 1   predniSONE (DELTASONE) 50 MG tablet Please take one tablet at 13 hours, 7 hours and one hour prior to CT scan 3 tablet 0   Probiotic Product (ALIGN PO) Take by mouth daily.     No current facility-administered medications for this visit.    REVIEW OF SYSTEMS:   Constitutional: ( - ) fevers, ( - )  chills , ( - ) night  sweats Eyes: ( - ) blurriness of vision, ( - ) double vision, ( - ) watery eyes Ears, nose, mouth, throat, and face: ( - ) mucositis, ( - ) sore throat Respiratory: ( - ) cough, ( - ) dyspnea, ( - ) wheezes Cardiovascular: ( - ) palpitation, ( - ) chest discomfort, ( - ) lower extremity swelling Gastrointestinal:  ( - ) nausea, ( - ) heartburn, ( - ) change in bowel habits Skin: ( - ) abnormal skin rashes Lymphatics: ( - ) new lymphadenopathy, ( - ) easy bruising Neurological: ( - ) numbness, ( - ) tingling, ( - ) new weaknesses Behavioral/Psych: ( - ) mood change, ( - ) new changes  All other systems were reviewed with the patient and are negative.  PHYSICAL EXAMINATION: Telephone Visit  LABORATORY DATA:  I have reviewed the data as listed CBC Latest Ref Rng & Units 07/12/2021 06/14/2021 05/15/2021  WBC 4.0 - 10.5 K/uL 4.3 5.8 5.4  Hemoglobin 12.0 - 15.0 g/dL 10.9(L) 11.6(L) 11.8(L)  Hematocrit 36.0 - 46.0 % 32.6(L) 34.1(L) 35.2(L)  Platelets 150 - 400 K/uL 222 250 246    CMP Latest Ref Rng & Units 07/12/2021 06/14/2021 05/15/2021  Glucose 70 - 99 mg/dL 73 84 79  BUN 8 - 23 mg/dL 8 17 13   Creatinine 0.44 - 1.00 mg/dL 0.61 0.68 0.64  Sodium 135 - 145 mmol/L 126(L) 128(L) 130(L)  Potassium 3.5 - 5.1 mmol/L 4.3 4.6 4.6  Chloride 98 - 111 mmol/L 90(L) 92(L) 94(L)  CO2 22 - 32 mmol/L 27 28 28   Calcium 8.9 - 10.3 mg/dL 9.0 9.5 9.6  Total Protein 6.5 - 8.1 g/dL 6.4(L) 6.5 6.2(L)  Total Bilirubin 0.3 - 1.2 mg/dL  0.3 0.4 0.4  Alkaline Phos 38 - 126 U/L 58 48 40  AST 15 - 41 U/L 21 17 17   ALT 0 - 44 U/L 12 9 12     RADIOGRAPHIC STUDIES:  MRI Brain 08/05/2020:   CLINICAL DATA:  Trigeminal neuralgia. Headache, cluster/trigeminal. Brain/CNS neoplasm, assess treatment response. Metastatic lung cancer. Additional history provided: SRS to 5 brain metastases 03/17/2019, left parietal craniotomy for tumor resection 03/18/2019, SRS to 2 additional metastases 01/01/2020.   EXAM: MRI HEAD WITHOUT  AND WITH CONTRAST   MRA HEAD WITHOUT CONTRAST   TECHNIQUE: Multiplanar, multiecho pulse sequences of the brain and surrounding structures were obtained without and with intravenous contrast. Angiographic images of the head were obtained using MRA technique without contrast.   CONTRAST:  72m MULTIHANCE GADOBENATE DIMEGLUMINE 529 MG/ML IV SOLN   COMPARISON:  Prior brain MRI examinations 04/30/2020 and earlier. MRA head 03/04/2019.   FINDINGS: MRI HEAD FINDINGS   Brain:   Stable generalized cerebral atrophy.   Again demonstrated is a hemosiderin stained left parietooccipital resection cavity. Irregular and gyriform enhancement has increased from the prior examination, now measuring up to 12 mm in thickness (for instance as seen on series 14, image 90) (series 15, image 9). This irregular enhancement extends to the site of a previously demonstrated 3 mm nodular focus of enhancement within the superolateral aspect of the cavity. Surrounding T2/FLAIR hyperintense signal abnormality has not significantly changed.   A previously demonstrated 4 mm lesion within the medial right cerebellum is no longer appreciated.   A 4 mm enhancing lesion within the left cerebellar hemisphere was present on the prior examination but seen to better advantage on today's study (series 14, image 48).   New 2 mm enhancing lesion more posteriorly within the medial left cerebellar hemisphere (series 14, image 50).   New 2 mm enhancing lesion within the lateral left cerebellum (series 14, image 42).   A 3 mm lesion within the high posterior right frontal lobe has decreased in size (series 14, image 145) (previously 5 mm).   A previously demonstrated 2 mm enhancing lesion within the left frontal lobe has increased in size, now measuring 8 mm (series 14, image 125) (series 11, image 39) . The lesion now demonstrates central T2/FLAIR hyperintensity and T1 hyperintensity with a peripheral low signal  rim. Precontrast T1 hyperintensity limits evaluation for enhancement at this site.   Multifocal T2/FLAIR hyperintensity elsewhere within the cerebral white matter and within the pons is nonspecific, but compatible chronic small vessel ischemic disease.   No evidence of acute infarction.   No extra-axial fluid collection.   No midline shift.   Vascular: Expected proximal arterial flow voids.   Skull and upper cervical spine: No focal suspicious marrow lesion.   Sinuses/Orbits: Visualized orbits show no acute finding. Trace ethmoid sinus mucosal thickening. No significant mastoid effusion.   MRA HEAD FINDINGS   The intracranial internal carotid arteries are patent. The M1 middle cerebral arteries are patent without significant stenosis. No M2 proximal branch occlusion or high-grade proximal stenosis is identified. The anterior cerebral arteries are patent. 1-2 mm inferiorly projecting vascular protrusion arising from the supraclinoid right ICA which may reflect an infundibulum or small aneurysm (series 103, image 12).   The intracranial vertebral arteries are patent. The basilar artery is patent. The posterior cerebral arteries are patent.   IMPRESSION: MRI brain:   1. Irregular and gyriform enhancement at site of the left parietooccipital resection cavity has increased, now measuring up to 12 mm  in thickness. This enhancement extends to the site of a previously demonstrated 3 mm nodular enhancing focus within the superolateral aspect of the cavity. Findings may reflect post-treatment inflammation or recurrent tumor. Short interval MRI follow-up is recommended. Surrounding T2 hyperintense signal abnormality has not appreciably changed. 2. New 2 mm metastasis within the posteromedial left cerebellar hemisphere. 3. New 2 mm metastasis within the lateral left cerebellum. 4. An additional 4 mm metastasis within the left cerebellum was present on the prior MRI of 04/30/2020,  but is seen to better advantage on today's study. 5. A previously demonstrated 4 mm metastasis within the medial right cerebellum is no longer appreciated 6. 8 mm metastasis within the paramedian left frontal lobe, increased in size and now demonstrating precontrast T1 hyperintensity which limits evaluation for enhancement. 7. A 3 mm lesion within the high posterior right frontal lobe has decreased in size.   MRA head:   1. No intracranial large vessel occlusion or proximal high-grade arterial stenosis. 2. 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     Electronically Signed   By: Kellie Simmering DO   On: 08/05/2020 13:49  No results found.   ASSESSMENT & PLAN [name] 80 y.o. female with medical history significant for metastatic adenocarcinoma of the lung with brain metastasis who presents for a follow up visit.  After review the records, review of the labs, review the imaging, discussion with the patient the findings are most consistent with a stage IV adenocarcinoma of the lung with a small lung lesion and metastatic spread to the brain.  Her metastatic brain mets have been treated with resection of tumor on 03/18/2019 as well as SRS on 12/12/2019.  Most recently she was found to have 2 new subcentimeter lesions and underwent stereotactic radiation therapy which was completed on 05/20/2020. Unfortunately on 08/05/2020 she was noted to have new CNS metastatic disease.   There are limitations in what therapies can be used for this patient given her poor functional status.  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  After extensive discussions with the patient's about the risks and benefits of this treatment she was agreeable to starting osimertinib 80 mg p.o. daily for her metastatic adenocarcinoma of the lung. She started this treatment on 09/05/2020.  On discussion today Mrs. Gabbert is tolerating  Tagrisso well and is willing and able to proceed at this time.  Restaging CT scans are planned for November 2022.   #Stage IV ([id number]) adenocarcinoma of the lung: -Left lower lobe resection on 12/21/2010, 14 lymph nodes negative, 2.2 cm tumor size, margins negative. -most recent MRI brain scan shows stable disease. No evidence of Chest/abdomen involvement on scan from 05/15/2021.  --started therapy with osimertinib 80 mg PO daily for her EGFR Exon 19 mutation. She started on 09/05/2020 -CT chest with contrast on 05/18/2021 showed no active disease in the chest. Continue CT scans q 3 months.  -PD-L1 testing was 1%. -- Patient is a poor candidate for systemic therapy, though she does have an EGFR Exon 19 mutation.  --RTC in 4 weeks time to assure continued tolerance of osimirtenib.   #Hand Foot Syndrome, resolved --previously noted to have desquamation and erythema of finger tips. Resolved on exam today.  --held therapy for 1-2 weeks until resolution --patient had resolution of symptoms after 2 weeks off therapy. Encouraged continue aggressive moisturizing lotion.    #Brain metastasis, stable -Presentation with  expressive aphasia to ER on 03/03/2019 along with right-sided weakness.  Found to have multiple brain metastasis and underwent SRS on 03/17/2019. 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 -Underwent SRS on 01/01/2020 for 2 subcentimeter brain lesions. -Brain MRI on 04/30/2020 showed 2 new subcentimeter meta stasis in the right cerebellum and right frontal lobe. -Stereotactic radiation therapy was completed on 05/20/2020 --most recent MRI on 05/12/2021 showed stable disease. Continue systemic therapy as above.   No orders of the defined types were placed in this encounter.  All questions were answered. The patient knows to call the clinic with any problems, questions or concerns.  A total of more than 30  minutes were spent on this encounter and over half of that time was spent on counseling and coordination of care as outlined above.   Ledell Peoples, MD Department of Hematology/Oncology [facility] Phone: [phone] Pager: [phone] Email: [email or web address]  07/12/2021 2:55 PM

## 2021-07-14 ENCOUNTER — Telehealth: Payer: Self-pay

## 2021-07-14 MED ORDER — MECLIZINE HCL 12.5 MG PO TABS
12.5000 mg | ORAL_TABLET | Freq: Three times a day (TID) | ORAL | 0 refills | Status: DC | PRN
Start: 2021-07-14 — End: 2021-09-13

## 2021-07-14 NOTE — Addendum Note (Signed)
Addended by: Sheral Flow on: 07/14/2021 05:02 PM   Modules accepted: Orders

## 2021-07-14 NOTE — Telephone Encounter (Signed)
Pt called in stating that she feels that her sodium is low, and would like to have something sent to the pharmacy for this and if it is possible to have something prescribed to relax her. Please advise.  Cb#: [phone]

## 2021-07-14 NOTE — Telephone Encounter (Signed)
Patient called back to follow up on MyChart message; experiencing dizziness. Patient aware provider wants her to come in for evaluation before a prescription will be written. Patient stated she's not moving to [facility] until 10/18 but will be unable to come in for a visit beforehand; wont' have any transportation.  Patient stated she will look for a provider in [place] instead.

## 2021-07-14 NOTE — Telephone Encounter (Signed)
Please advise 

## 2021-07-17 DIAGNOSIS — M25551 Pain in right hip: Secondary | ICD-10-CM | POA: Diagnosis not present

## 2021-07-17 IMAGING — PT NM PET TUM IMG RESTAG (PS) SKULL BASE T - THIGH
3 series · 22 of 25 positions shown · non-contrast
Comparison: CT abdomen/pelvis dated 06/04/2019. CTA chest dated
05/26/2019.

CLINICAL DATA: Subsequent treatment strategy for lung cancer.
Status post radiation.

EXAM:
NUCLEAR MEDICINE PET SKULL BASE TO THIGH
TECHNIQUE: 5.8 mCi F-18 FDG was injected intravenously. Full-ring PET imaging
was performed from the skull base to thigh after the radiotracer. CT
data was obtained and used for attenuation correction and anatomic
localization.
Fasting blood glucose: 89 mg/dl

[axial ct wb fusion · 15 of 158 slices shown]
[im 1/158]
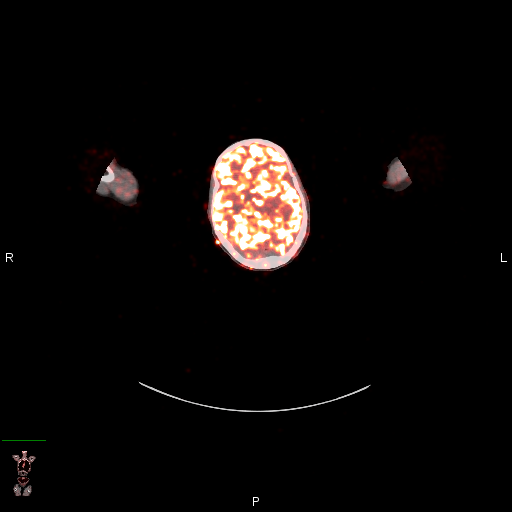
[im 10/158]
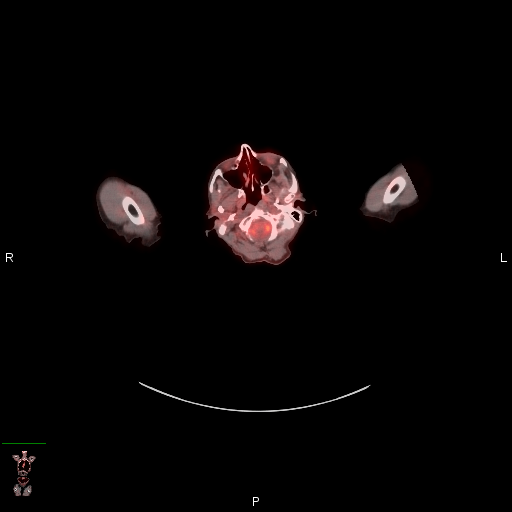
[im 20/158]
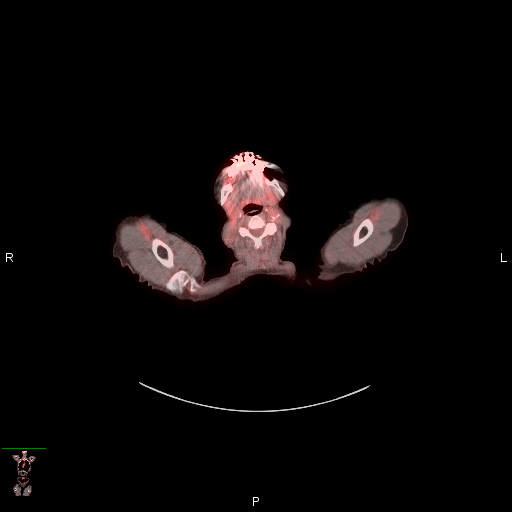
[im 30/158]
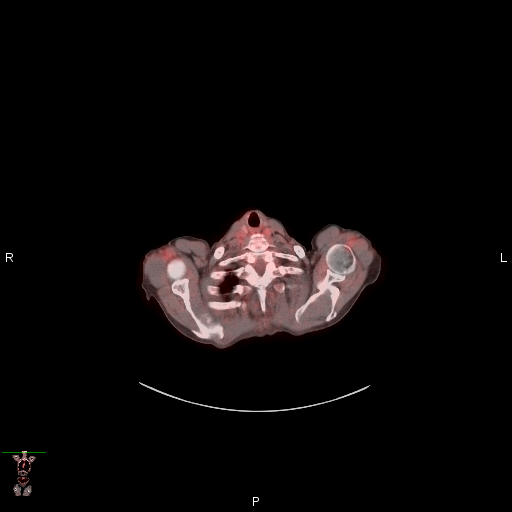
[im 50/158]
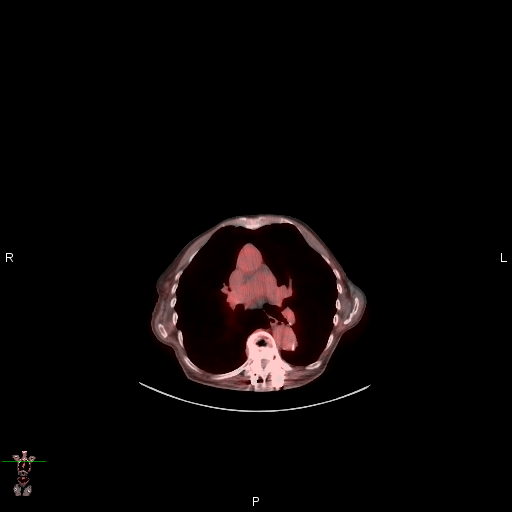
[im 59/158]
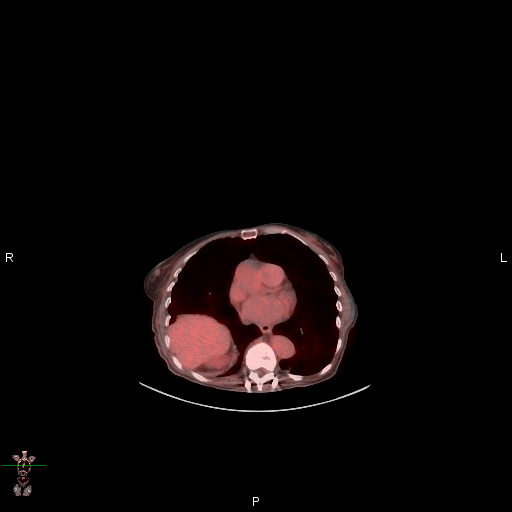
[im 69/158]
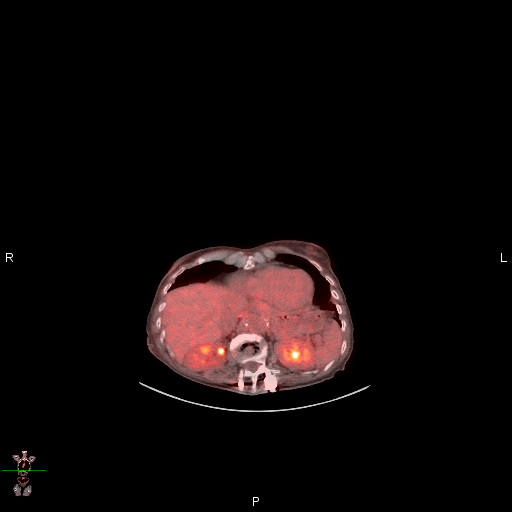
[im 79/158]
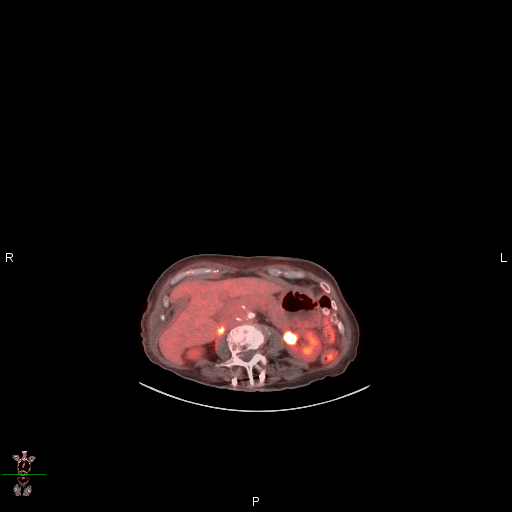
[im 89/158]
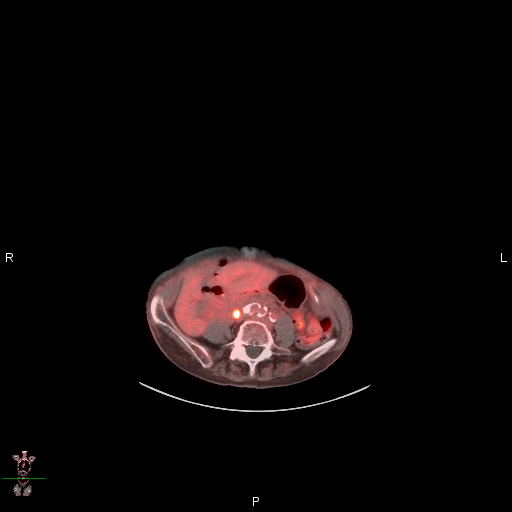
[im 99/158]
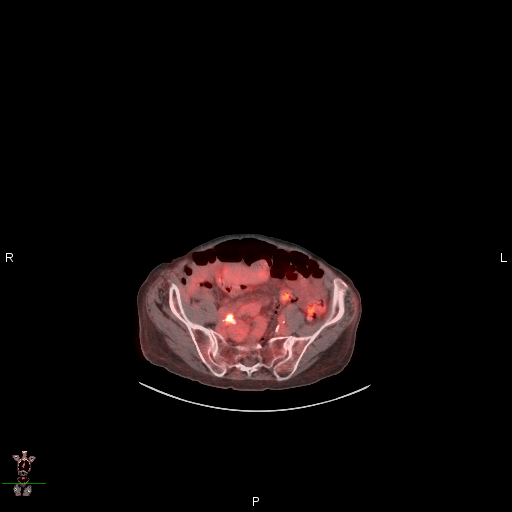
[im 108/158]
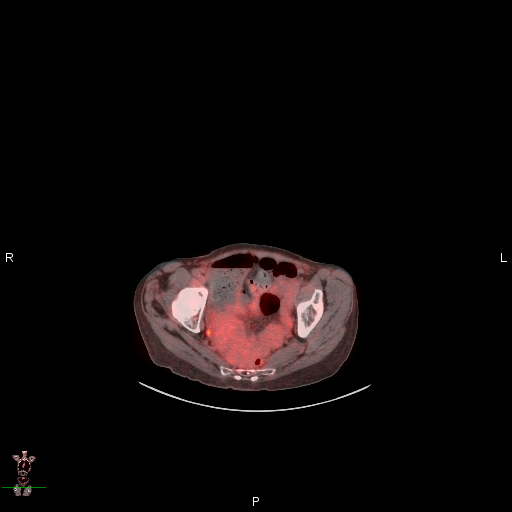
[im 128/158]
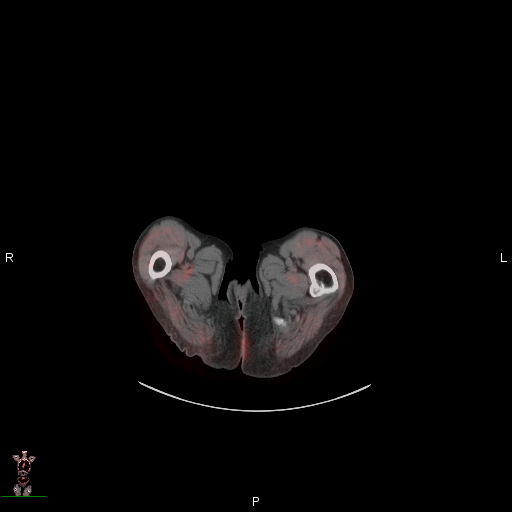
[im 138/158]
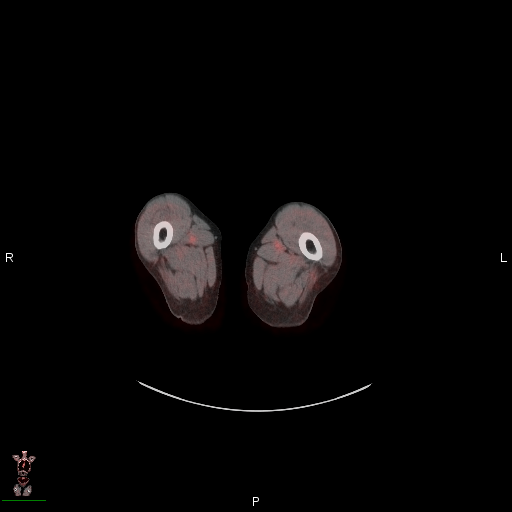
[im 148/158]
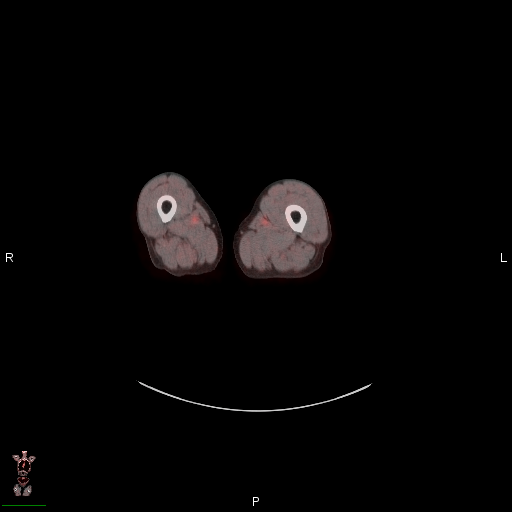
[im 158/158]
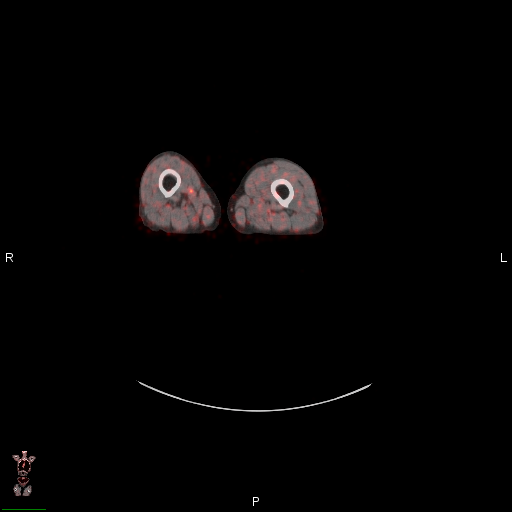

[mip · 4 of 48 slices shown]
[im 1/48]
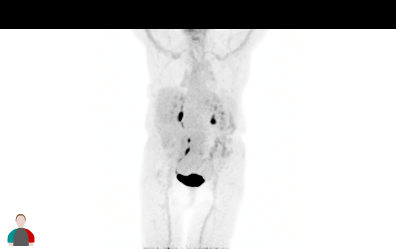
[im 12/48]
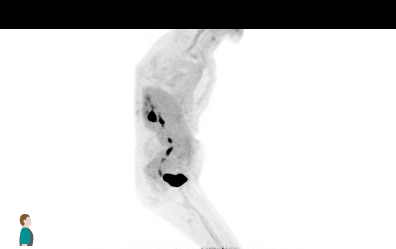
[im 24/48]
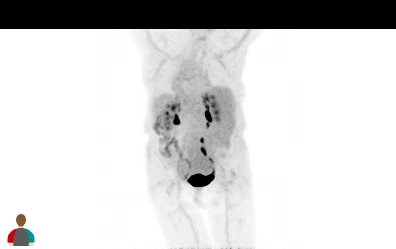
[im 48/48]
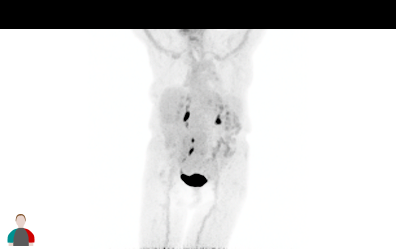

[coronal ct wb fusion · 3 of 32 slices shown]
[im 1/32]
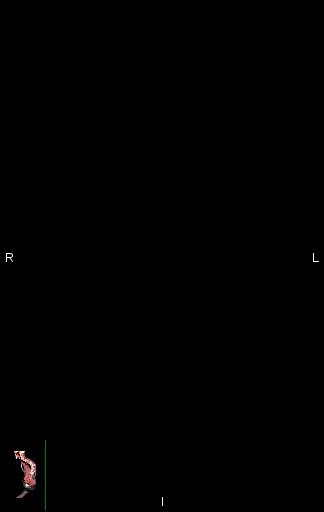
[im 16/32]
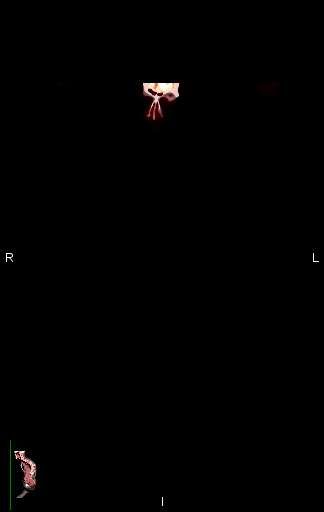
[im 32/32]
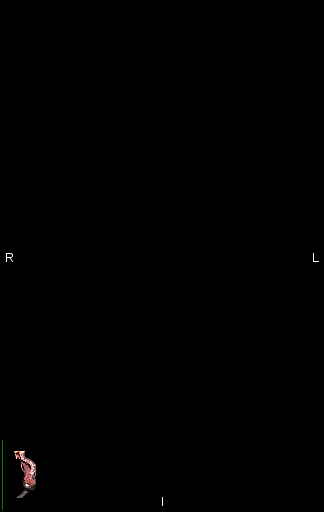

[22 of 25 positions shown; findings below may reference images not displayed]

FINDINGS: Mediastinal blood pool activity: SUV max

Liver activity: SUV max NA

NECK: No hypermetabolic thoracic lymphadenopathy.

Incidental CT findings: none

CHEST: Status post left lower lobe wedge resection. Associated
linear scarring/atelectasis in the left lower lobe. No
hypermetabolic pulmonary nodules. Stable 3 mm nodule in the superior
segment right lower lobe (series 3/image 80), benign. Moderate
centrilobular emphysematous changes, upper lung predominant.

No hypermetabolic thoracic lymphadenopathy.

Incidental CT findings: Atherosclerotic calcifications of the aortic
root/arch. Coronary atherosclerosis of the left circumflex.

ABDOMEN/PELVIS: No abnormal hypermetabolism in the liver, spleen,
pancreas, or adrenal glands.

No hypermetabolic abdominopelvic lymphadenopathy.

Excretory hypermetabolism along the course of the right ureter.

Incidental CT findings: Motion degraded images. 3.2 cm infrarenal
abdominal aortic aneurysm. Atherosclerotic calcifications the
abdominal aorta and branch vessels. Prior cholecystectomy.

SKELETON: No focal hypermetabolic activity to suggest skeletal
metastasis.

Incidental CT findings: Degenerative changes of the visualized
thoracolumbar spine.
IMPRESSION: Status post left lower lobe wedge resection.

No findings suspicious for recurrent or metastatic disease.

## 2021-07-19 ENCOUNTER — Telehealth: Payer: Self-pay | Admitting: Family Medicine

## 2021-07-19 ENCOUNTER — Other Ambulatory Visit: Payer: Self-pay | Admitting: Family Medicine

## 2021-07-19 NOTE — Telephone Encounter (Signed)
Ok to refill??  Last office visit 07/04/2020.  Last refill 06/27/2021

## 2021-07-19 NOTE — Telephone Encounter (Signed)
Patient moving to [facility] on 10/18; requesting referral to a provider in that area. Please advise at [phone].  Patient bids Dr Dennard Schaumann farewell and sends him blessings.

## 2021-07-26 ENCOUNTER — Other Ambulatory Visit: Payer: Self-pay | Admitting: Family Medicine

## 2021-07-27 ENCOUNTER — Encounter (HOSPITAL_COMMUNITY): Payer: Self-pay | Admitting: Internal Medicine

## 2021-07-27 ENCOUNTER — Other Ambulatory Visit (HOSPITAL_COMMUNITY): Payer: Self-pay

## 2021-08-01 DIAGNOSIS — C3490 Malignant neoplasm of unspecified part of unspecified bronchus or lung: Secondary | ICD-10-CM | POA: Diagnosis not present

## 2021-08-01 DIAGNOSIS — J449 Chronic obstructive pulmonary disease, unspecified: Secondary | ICD-10-CM | POA: Diagnosis not present

## 2021-08-01 DIAGNOSIS — R0902 Hypoxemia: Secondary | ICD-10-CM | POA: Diagnosis not present

## 2021-08-01 DIAGNOSIS — J9621 Acute and chronic respiratory failure with hypoxia: Secondary | ICD-10-CM | POA: Diagnosis not present

## 2021-08-03 ENCOUNTER — Other Ambulatory Visit (HOSPITAL_COMMUNITY): Payer: Self-pay

## 2021-08-04 ENCOUNTER — Encounter: Payer: Self-pay | Admitting: Family Medicine

## 2021-08-04 ENCOUNTER — Telehealth: Payer: Self-pay | Admitting: *Deleted

## 2021-08-04 NOTE — Telephone Encounter (Signed)
Received call from pt and his friend [place]. They are both telling me that Jenisa is planning on returning to the [place] area by the middle of November. She will call when she is back in town to schedule her next follow up appt.    Dr. [name] is aware.

## 2021-08-07 ENCOUNTER — Telehealth: Payer: Self-pay | Admitting: *Deleted

## 2021-08-07 NOTE — Telephone Encounter (Signed)
Received call from pt requesting that her medical records be fax'd to [facility] near [place], [place] Fax # [phone]  Pt has moved there to live with her granddaughter.  Records fax'd

## 2021-08-09 ENCOUNTER — Inpatient Hospital Stay: Payer: PPO

## 2021-08-09 ENCOUNTER — Inpatient Hospital Stay: Payer: PPO | Admitting: Hematology and Oncology

## 2021-08-10 ENCOUNTER — Other Ambulatory Visit (HOSPITAL_COMMUNITY): Payer: Self-pay

## 2021-08-14 ENCOUNTER — Other Ambulatory Visit (HOSPITAL_COMMUNITY): Payer: Self-pay

## 2021-08-15 ENCOUNTER — Telehealth: Payer: Self-pay | Admitting: *Deleted

## 2021-08-15 NOTE — Telephone Encounter (Signed)
Received call from patient.   Reports that she has productive cough with thick yellow mucus and chest congestion. States that she is currently under fluid restriction for hyponatremia. Advised to push fluid intake to max (1274mL) to help thin secretions.   Appointment scheduled for telephone visit as patient is currently in [place], [place].

## 2021-08-16 ENCOUNTER — Other Ambulatory Visit: Payer: Self-pay

## 2021-08-16 ENCOUNTER — Encounter: Payer: Self-pay | Admitting: Nurse Practitioner

## 2021-08-16 ENCOUNTER — Ambulatory Visit (INDEPENDENT_AMBULATORY_CARE_PROVIDER_SITE_OTHER): Payer: PPO | Admitting: Nurse Practitioner

## 2021-08-16 VITALS — HR 107

## 2021-08-16 DIAGNOSIS — J441 Chronic obstructive pulmonary disease with (acute) exacerbation: Secondary | ICD-10-CM | POA: Diagnosis not present

## 2021-08-16 MED ORDER — PREDNISONE 20 MG PO TABS: 40.0000 mg | ORAL_TABLET | Freq: Every day | ORAL | 0 refills | Status: AC

## 2021-08-16 MED ORDER — DOXYCYCLINE HYCLATE 100 MG PO TABS: 100.0000 mg | ORAL_TABLET | Freq: Two times a day (BID) | ORAL | 0 refills | Status: DC

## 2021-08-16 NOTE — Telephone Encounter (Signed)
Agree with plan 

## 2021-08-16 NOTE — Progress Notes (Signed)
Subjective:    Patient ID: [name], female    DOB: 1941-01-10, 80 y.o.   MRN: [id number]  HPI: [name] is a 79 y.o. female presenting for cough.  Chief Complaint  Patient presents with   Cough   UPPER RESPIRATORY TRACT INFECTION Onset: Monday/Tuesday Fever: no Body aches: no Chills: no Cough:  yes; congested Shortness of breath: yes Wheezing: no Chest pain: no Chest tightness: no Chest congestion: yes Nasal congestion: no Runny nose: yes Post nasal drip: no Sneezing: no Sore throat: no Swollen glands: no Sinus pressure: no Headache: no Face pain: no Toothache: no Ear pain: no  Ear pressure: no  Eyes red/itching:no Eye drainage/crusting: no  Nausea: no  Vomiting: no Diarrhea: no  Change in appetite: yes ; decreased Loss of taste/smell: no  Rash: no Fatigue: yes Sick contacts: no Strep contacts: no  Context: fluctuating Recurrent sinusitis: no Treatments attempted: theraflu, has not been pushing fluids because of hyponatremia, albuterol rescue inhaler Relief with OTC medications: yes  Allergies  Allergen Reactions   Ciprofloxacin Hcl Hives   Sulfonamide Derivatives Hives and Rash   Ciprofloxacin Hives   Iohexol Other (See Comments)    UNSPECIFIED REACTION  Desc: [facility], pt is allergic to IV contrast, no type of reaction was available. Pt cannot remember but first reacted around 15 yrs ago from an IVP. Notes were date from 2009 at urology center., Onset Date: [id number]    Iodinated Diagnostic Agents Rash and Itching   Prozac [Fluoxetine Hcl] Rash    Outpatient Encounter Medications as of 08/16/2021  Medication Sig   albuterol (VENTOLIN HFA) 108 (90 Base) MCG/ACT inhaler INHALE 2 PUFFS EVERY 6 HOURS AS NEEDED FOR WHEEZING OR SHORTNESS OF BREATH.   ALPRAZolam (XANAX) 0.5 MG tablet TAKE (1) TABLET BY MOUTH TWICE A DAY AS NEEDED.   citalopram (CELEXA) 40 MG tablet TAKE (1/2) TABLET BY MOUTH AT BEDTIME.   denosumab (PROLIA) 60  MG/ML SOSY injection INJECT INTO UPPER ARM, THIGH, OR ABDOMEN ONCE.   diphenhydrAMINE (BENADRYL) 50 MG tablet Take 1 tablet (50 mg total) by mouth once for 1 dose. Take one hour prior to CT scan   ferrous sulfate 325 (65 FE) MG tablet Take 325 mg by mouth daily with breakfast.   fluticasone (FLONASE) 50 MCG/ACT nasal spray Place 2 sprays into both nostrils daily.   gabapentin (NEURONTIN) 100 MG capsule TAKE 1 CAPSULE BY MOUTH THREE TIMES A DAY.   levocetirizine (XYZAL) 5 MG tablet TAKE ONE TABLET BY MOUTH ONCE DAILY.   levothyroxine (SYNTHROID) 50 MCG tablet TAKE 1 TABLET BEFORE BREAKFAST.   meclizine (ANTIVERT) 12.5 MG tablet Take 1 tablet (12.5 mg total) by mouth 3 (three) times daily as needed for dizziness.   Multiple Vitamin (MULTIVITAMIN WITH MINERALS) TABS tablet Take 1 tablet by mouth daily.   ondansetron (ZOFRAN) 4 MG tablet TAKE 1 TABLET BY MOUTH 3 TIMES DAILY AS NEEDED FOR NAUSEA AND VOMITING.   osimertinib mesylate (TAGRISSO) 80 MG tablet TAKE 1 TABLET (80 MG TOTAL) BY MOUTH DAILY.   OVER THE COUNTER MEDICATION Bone Support 2-4 tabs daily   OXcarbazepine (TRILEPTAL) 150 MG tablet TAKE (1) TABLET BY MOUTH TWICE DAILY.   pantoprazole (PROTONIX) 40 MG tablet TAKE (1) TABLET BY MOUTH ONCE DAILY BEFORE BREAKFAST.   predniSONE (DELTASONE) 50 MG tablet Please take one tablet at 13 hours, 7 hours and one hour prior to CT scan   Probiotic Product (ALIGN PO) Take by mouth daily.  TRELEGY ELLIPTA 100-62.5-25 MCG/INH AEPB INHALE 1 PUFF INTO LUNGS DAILY.   No facility-administered encounter medications on file as of 08/16/2021.    Patient Active Problem List   Diagnosis Date Noted   Abdominal bloating 01/13/2020   Primary cancer of left lower lobe of lung ([place]) 12/29/2019   Flatulence 11/17/2019   IBS (irritable bowel syndrome)    Acute hypoxemic respiratory failure ([place]) 05/25/2019   Pharyngeal dysphagia 05/07/2019   Brain metastasis ([place]) 04/17/2019   Brain tumor ([place]) 03/18/2019    Essential hypertension 03/12/2019   Brain metastases ([place]) 03/06/2019   Cytotoxic brain edema ([place]) 03/04/2019   ICH (intracerebral hemorrhage) ([place]) - L parietal lobe 03/03/2019   Nontraumatic intracerebral hemorrhage, unspecified ([place]) 03/03/2019   Osteoporosis 05/16/2018   S/P ORIF (open reduction internal fixation) fracture right patella 03/18/18 03/26/2018   Other specified postprocedural states 03/26/2018   Cellulitis, wound, post-operative 03/22/2018   Fracture of right patella 03/18/2018   Normocytic anemia    Esophageal dysphagia 11/27/2017   Heme positive stool 11/27/2017   Nasal crusting 02/06/2017   Iron deficiency anemia 12/05/2016   SIADH (syndrome of inappropriate ADH production) ([place])    Polypharmacy 09/14/2016   Muscle weakness (generalized)    Diastolic dysfunction 17/91/5056   Syncope 09/10/2016   Rib pain on left side    Hypersomnia 02/16/2016   Acute bronchitis 02/16/2016   Exertional dyspnea 12/20/2015   HLD (hyperlipidemia) 11/23/2014   Change in bowel habits 11/10/2014   Abdominal pain, left lower quadrant 11/10/2014   Emphysema lung ([place]) 97/94/8016   Helicobacter pylori gastritis 07/30/2014   Acute on chronic respiratory failure with hypoxia ([place]) 04/10/2014   Wrist fracture 04/09/2014   Occlusion and stenosis of carotid artery without mention of cerebral infarction 03/16/2014   T12 burst fracture ([place]) 01/13/2014   Constipation 12/14/2013   Acute respiratory failure with hypoxia ([place]) 12/14/2013   COPD (chronic obstructive pulmonary disease) ([place]) 12/14/2013   Compression fracture of thoracic vertebra ([place]) 12/14/2013   Hypoxia 12/13/2013   Compression fracture 12/13/2013   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[place]) 08/11/2013   AAA (abdominal aortic aneurysm) without rupture ([place]) 02/26/2012   Dyspepsia 02/11/2012   Aneurysm ([place]) 02/11/2012   Trigeminal neuralgia    Epigastric pain 02/21/2011   Dysphagia 02/21/2011   Adenocarcinoma of lung ([place]) 55/37/4827    HELICOBACTER PYLORI GASTRITIS, HX OF 12/30/2009   Radiographic dye allergy status 12/30/2009   Personal history of other diseases of the digestive system 12/30/2009    Past Medical History:  Diagnosis Date   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   left lung/surg only   Allergic rhinitis    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[place])    corrected by surgery 08/21/13   Anxiety disorder    Aortic aneurysm ([place])    Brain metastasis ([place])    lung cancer s/p resection 7/20   Cancer ([place])    Phreesia 07/86/7544   Complication of anesthesia 2011   bloodpressure dropped during colonoscopy, not problems since   Compression fracture    COPD (chronic obstructive pulmonary disease) (HCC)    Depression    Diastolic dysfunction    Diverticulosis    Dysphagia    Emphysema lung ([place]) 11/08/2014   Headache    Hiatal hernia    History of kidney stones    Hypothyroidism    IBS (irritable bowel syndrome)    Iron deficiency anemia due to chronic blood loss 12/05/2016   On home O2 12/15/2013   chronic hypoxia   Pneumonia  PUD (peptic ulcer disease)    35yrs   Shortness of breath    with exertion   SIADH (syndrome of inappropriate ADH production) (HCC)    Trigeminal neuralgia     Relevant past medical, surgical, family and social history reviewed and updated as indicated. Interim medical history since our last visit reviewed.  Review of Systems Per HPI unless specifically indicated above     Objective:    There were no vitals taken for this visit.  Wt Readings from Last 3 Encounters:  07/12/21 104 lb 12.8 oz (47.5 kg)  06/14/21 106 lb 8 oz (48.3 kg)  05/15/21 108 lb 3.2 oz (49.1 kg)    Physical Exam Physical examination unable to be performed due to lack of equipment.  Patient talking in complete sentences during telemedicine visit.      Assessment & Plan:  1. COPD exacerbation ([place]) Acute.  Concern with desaturation at home and increase in coughing with purulent sputum.  Today, her oxygen  saturation is at goal.  Start prednisone 40 mg x 5 days and doxycycline.  She sees Materials engineer in 2 days.  Will report back to [place] if her symptoms are not improved by then.  - doxycycline (VIBRA-TABS) 100 MG tablet; Take 1 tablet (100 mg total) by mouth 2 (two) times daily.  Dispense: 14 tablet; Refill: 0 - predniSONE (DELTASONE) 20 MG tablet; Take 2 tablets (40 mg total) by mouth daily with breakfast for 5 days.  Dispense: 10 tablet; Refill: 0    Follow up plan: No follow-ups on file.    This visit was completed via telephone due to the restrictions of the COVID-19 pandemic. All issues as above were discussed and addressed but no physical exam was performed. If it was felt that the patient should be evaluated in the office, they were directed there. The patient verbally consented to this visit. Patient was unable to complete an audio/visual visit due to Lack of equipment. Location of the patient: home - [place] Location of the provider: work Those involved with this call:  Provider: Noemi Chapel, DNP, FNP-C CMA: n/a Front Desk/Registration: Santina Evans  Time spent on call:  8 minutes on the phone discussing health concerns. 15 minutes total spent in review of patient's record and preparation of their chart. I verified patient identity using two factors (patient name and date of birth). Patient consents verbally to being seen via telemedicine visit today.

## 2021-08-18 ENCOUNTER — Ambulatory Visit (HOSPITAL_COMMUNITY): Payer: PPO

## 2021-08-18 ENCOUNTER — Other Ambulatory Visit: Payer: Self-pay | Admitting: Family Medicine

## 2021-08-18 NOTE — Telephone Encounter (Signed)
Ok to refill??  Last office visit 08/16/2021.  Last refill 07/20/2021.

## 2021-08-19 ENCOUNTER — Other Ambulatory Visit: Payer: Self-pay | Admitting: Family Medicine

## 2021-08-21 ENCOUNTER — Telehealth: Payer: Self-pay | Admitting: Family Medicine

## 2021-08-21 NOTE — Telephone Encounter (Signed)
pharmacy changed in patient chart.   Please clarify if patient is asking for refills.

## 2021-08-21 NOTE — Telephone Encounter (Signed)
Patient called to [facility] all of [facility], address is [address], [place], [place] [address]; stated this will be her new pharmacy moving forward as patient unable to return to this area.   Please advise at [phone].

## 2021-08-21 NOTE — Telephone Encounter (Signed)
Patient didn't request refills; only asked for me to update pharmacy information since unable to move back to the area.

## 2021-08-23 DIAGNOSIS — D509 Iron deficiency anemia, unspecified: Secondary | ICD-10-CM | POA: Diagnosis not present

## 2021-08-23 DIAGNOSIS — C7931 Secondary malignant neoplasm of brain: Secondary | ICD-10-CM | POA: Diagnosis not present

## 2021-08-23 DIAGNOSIS — E079 Disorder of thyroid, unspecified: Secondary | ICD-10-CM | POA: Diagnosis not present

## 2021-08-23 DIAGNOSIS — C3432 Malignant neoplasm of lower lobe, left bronchus or lung: Secondary | ICD-10-CM | POA: Diagnosis not present

## 2021-08-23 DIAGNOSIS — E559 Vitamin D deficiency, unspecified: Secondary | ICD-10-CM | POA: Diagnosis not present

## 2021-08-25 ENCOUNTER — Encounter: Payer: Self-pay | Admitting: Hematology and Oncology

## 2021-08-27 ENCOUNTER — Encounter: Payer: Self-pay | Admitting: Family Medicine

## 2021-09-01 ENCOUNTER — Other Ambulatory Visit: Payer: Self-pay | Admitting: Hematology and Oncology

## 2021-09-01 ENCOUNTER — Other Ambulatory Visit (HOSPITAL_COMMUNITY): Payer: Self-pay

## 2021-09-01 DIAGNOSIS — C3490 Malignant neoplasm of unspecified part of unspecified bronchus or lung: Secondary | ICD-10-CM | POA: Diagnosis not present

## 2021-09-01 DIAGNOSIS — J449 Chronic obstructive pulmonary disease, unspecified: Secondary | ICD-10-CM | POA: Diagnosis not present

## 2021-09-01 DIAGNOSIS — R0902 Hypoxemia: Secondary | ICD-10-CM | POA: Diagnosis not present

## 2021-09-01 DIAGNOSIS — J9621 Acute and chronic respiratory failure with hypoxia: Secondary | ICD-10-CM | POA: Diagnosis not present

## 2021-09-04 ENCOUNTER — Encounter: Payer: Self-pay | Admitting: Hematology and Oncology

## 2021-09-04 ENCOUNTER — Other Ambulatory Visit (HOSPITAL_COMMUNITY): Payer: Self-pay

## 2021-09-04 ENCOUNTER — Other Ambulatory Visit: Payer: Self-pay

## 2021-09-04 ENCOUNTER — Ambulatory Visit (INDEPENDENT_AMBULATORY_CARE_PROVIDER_SITE_OTHER): Payer: PPO | Admitting: Nurse Practitioner

## 2021-09-04 DIAGNOSIS — H9201 Otalgia, right ear: Secondary | ICD-10-CM

## 2021-09-04 MED ORDER — AZITHROMYCIN 250 MG PO TABS
ORAL_TABLET | ORAL | 0 refills | Status: AC
Start: 2021-09-04 — End: 2021-09-09

## 2021-09-04 NOTE — Progress Notes (Signed)
Subjective:    Patient ID: [name], female    DOB: 01-04-1941, 80 y.o.   MRN: [id number]  HPI: [name] is a 80 y.o. female presenting for ear pain.  Chief Complaint  Patient presents with   Cough   EAR PAIN Duration: weeks Involved ear(s): right Severity:  moderate  Quality:  stopped up Fever: no Otorrhea: no Upper respiratory infection symptoms: yes; recently treated for COPD exacerbation and not fully recovered Pruritus: no Hearing loss: yes Water immersion no Using Q-tips: no Recurrent otitis media: no Status: stable Treatments attempted: cleaned ears out last week  UPPER RESPIRATORY TRACT INFECTION Onset: ongoing Fever: no Body aches: no Cough: yes; congested Shortness of breath: no Wheezing: no Chest pain: no Chest tightness: no Chest congestion: yes Nasal congestion: yes Runny nose: yes Post nasal drip: yes Sneezing: no Sore throat: no Swollen glands: no Sinus pressure: no Headache: no Face pain: no Toothache: no Ear pain: no Ear pressure: yes; had ear cleaned last week and ear feels stopped up  on right side Eyes red/itching:no Eye drainage/crusting: no  Nausea: no  Vomiting: no Diarrhea: no  Change in appetite: no  Loss of taste/smell: no  Rash: no Fatigue: no Sick contacts: no Strep contacts: no   Allergies  Allergen Reactions   Ciprofloxacin Hcl Hives   Sulfonamide Derivatives Hives and Rash   Ciprofloxacin Hives   Iohexol Other (See Comments)    UNSPECIFIED REACTION  Desc: [facility], pt is allergic to IV contrast, no type of reaction was available. Pt cannot remember but first reacted around 15 yrs ago from an IVP. Notes were date from 2009 at urology center., Onset Date: [id number]    Iodinated Diagnostic Agents Rash and Itching   Prozac [Fluoxetine Hcl] Rash    Outpatient Encounter Medications as of 09/04/2021  Medication Sig   azithromycin (ZITHROMAX) 250 MG tablet Take 2 tablets on day 1, then 1  tablet daily on days 2 through 5   albuterol (VENTOLIN HFA) 108 (90 Base) MCG/ACT inhaler INHALE 2 PUFFS EVERY 6 HOURS AS NEEDED FOR WHEEZING OR SHORTNESS OF BREATH.   ALPRAZolam (XANAX) 0.5 MG tablet TAKE (1) TABLET BY MOUTH TWICE A DAY AS NEEDED.   citalopram (CELEXA) 40 MG tablet TAKE (1/2) TABLET BY MOUTH AT BEDTIME.   denosumab (PROLIA) 60 MG/ML SOSY injection INJECT INTO UPPER ARM, THIGH, OR ABDOMEN ONCE.   diphenhydrAMINE (BENADRYL) 50 MG tablet Take 1 tablet (50 mg total) by mouth once for 1 dose. Take one hour prior to CT scan   doxycycline (VIBRA-TABS) 100 MG tablet Take 1 tablet (100 mg total) by mouth 2 (two) times daily.   ferrous sulfate 325 (65 FE) MG tablet Take 325 mg by mouth daily with breakfast.   fluticasone (FLONASE) 50 MCG/ACT nasal spray Place 2 sprays into both nostrils daily.   gabapentin (NEURONTIN) 100 MG capsule TAKE 1 CAPSULE BY MOUTH THREE TIMES A DAY.   levocetirizine (XYZAL) 5 MG tablet TAKE ONE TABLET BY MOUTH ONCE DAILY.   levothyroxine (SYNTHROID) 50 MCG tablet TAKE 1 TABLET BEFORE BREAKFAST.   meclizine (ANTIVERT) 12.5 MG tablet Take 1 tablet (12.5 mg total) by mouth 3 (three) times daily as needed for dizziness.   Multiple Vitamin (MULTIVITAMIN WITH MINERALS) TABS tablet Take 1 tablet by mouth daily.   ondansetron (ZOFRAN) 4 MG tablet TAKE 1 TABLET BY MOUTH 3 TIMES DAILY AS NEEDED FOR NAUSEA AND VOMITING.   osimertinib mesylate (TAGRISSO) 80 MG tablet TAKE  1 TABLET (80 MG TOTAL) BY MOUTH DAILY.   OVER THE COUNTER MEDICATION Bone Support 2-4 tabs daily   OXcarbazepine (TRILEPTAL) 150 MG tablet TAKE (1) TABLET BY MOUTH TWICE DAILY.   pantoprazole (PROTONIX) 40 MG tablet TAKE (1) TABLET BY MOUTH ONCE DAILY BEFORE BREAKFAST.   Probiotic Product (ALIGN PO) Take by mouth daily.   TRELEGY ELLIPTA 100-62.5-25 MCG/ACT AEPB INHALE 1 PUFF INTO LUNGS DAILY.   No facility-administered encounter medications on file as of 09/04/2021.    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   Brain tumor ([place]) 03/18/2019   Essential hypertension 03/12/2019   Brain metastases ([place]) 03/06/2019   Cytotoxic brain edema ([place]) 03/04/2019   ICH (intracerebral hemorrhage) ([place]) - L parietal lobe 03/03/2019   Nontraumatic intracerebral hemorrhage, unspecified ([place]) 03/03/2019   Osteoporosis 05/16/2018   S/P ORIF (open reduction internal fixation) fracture right patella 03/18/18 03/26/2018   Other specified postprocedural states 03/26/2018   Cellulitis, wound, post-operative 03/22/2018   Fracture of right patella 03/18/2018   Normocytic anemia    Esophageal dysphagia 11/27/2017   Heme positive stool 11/27/2017   Nasal crusting 02/06/2017   Iron deficiency anemia 12/05/2016   SIADH (syndrome of inappropriate ADH production) ([place])    Polypharmacy 09/14/2016   Muscle weakness (generalized)    Diastolic dysfunction 20/25/4270   Syncope 09/10/2016   Rib pain on left side    Hypersomnia 02/16/2016   Acute bronchitis 02/16/2016   Exertional dyspnea 12/20/2015   HLD (hyperlipidemia) 11/23/2014   Change in bowel habits 11/10/2014   Abdominal pain, left lower quadrant 11/10/2014   Emphysema lung ([place]) 62/37/6283   Helicobacter pylori gastritis 07/30/2014   Acute on chronic respiratory failure with hypoxia ([place]) 04/10/2014   Wrist fracture 04/09/2014   Occlusion and stenosis of carotid artery without mention of cerebral infarction 03/16/2014   T12 burst fracture ([place]) 01/13/2014   Constipation 12/14/2013   Acute respiratory failure with hypoxia ([place]) 12/14/2013   COPD (chronic obstructive pulmonary disease) ([place]) 12/14/2013   Compression fracture of thoracic vertebra ([place]) 12/14/2013    Hypoxia 12/13/2013   Compression fracture 12/13/2013   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[place]) 08/11/2013   AAA (abdominal aortic aneurysm) without rupture ([place]) 02/26/2012   Dyspepsia 02/11/2012   Aneurysm ([place]) 02/11/2012   Trigeminal neuralgia    Epigastric pain 02/21/2011   Dysphagia 02/21/2011   Adenocarcinoma of lung ([place]) 15/17/6160   HELICOBACTER PYLORI GASTRITIS, HX OF 12/30/2009   Radiographic dye allergy status 12/30/2009   Personal history of other diseases of the digestive system 12/30/2009    Past Medical History:  Diagnosis Date   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   left lung/surg only   Allergic rhinitis    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[place])    corrected by surgery 08/21/13   Anxiety disorder    Aortic aneurysm ([place])    Brain metastasis ([place])    lung cancer s/p resection 7/20   Cancer ([place])    Phreesia 73/71/0626   Complication of anesthesia 2011   bloodpressure dropped during colonoscopy, not problems since   Compression fracture    COPD (chronic obstructive pulmonary disease) (HCC)    Depression    Diastolic dysfunction    Diverticulosis  Dysphagia    Emphysema lung ([place]) 11/08/2014   Headache    Hiatal hernia    History of kidney stones    Hypothyroidism    IBS (irritable bowel syndrome)    Iron deficiency anemia due to chronic blood loss 12/05/2016   On home O2 12/15/2013   chronic hypoxia   Pneumonia    PUD (peptic ulcer disease)    20yrs   Shortness of breath    with exertion   SIADH (syndrome of inappropriate ADH production) (HCC)    Trigeminal neuralgia     Relevant past medical, surgical, family and social history reviewed and updated as indicated. Interim medical history since our last visit reviewed.  Review of Systems Per HPI unless specifically indicated above     Objective:    SpO2 97%   Wt Readings from Last 3 Encounters:  07/12/21 104 lb 12.8 oz (47.5 kg)  06/14/21 106 lb 8 oz (48.3 kg)  05/15/21 108 lb 3.2 oz (49.1 kg)    Physical  Exam Physical examination unable to be performed due to lack of equipment.     Assessment & Plan:  1. Right ear pain Acute.  I discussed with the patient that this is a very difficult visit because I cannot physically assess her ongoing upper respiratory symptoms or her ear.  I would prefer she seek urgent care, however the patient is limited physically and without a ride to the urgent care.  Her symptoms today do not warrant emergent evaluation.  We will plan to treat the ear pain empirically for otitis media with azithromycin.  I discussed with the patient that if her symptoms persist or continue, she needs to be evaluated in person.  She verbalizes understanding.  - azithromycin (ZITHROMAX) 250 MG tablet; Take 2 tablets on day 1, then 1 tablet daily on days 2 through 5  Dispense: 6 tablet; Refill: 0   Follow up plan: Return if symptoms worsen or fail to improve.   This visit was completed via telephone due to the restrictions of the COVID-19 pandemic. All issues as above were discussed and addressed but no physical exam was performed. If it was felt that the patient should be evaluated in the office, they were directed there. The patient verbally consented to this visit. Patient was unable to complete an audio/visual visit due to Lack of equipment. Location of the patient: home - [place] Location of the provider: home Those involved with this call:  Provider: Noemi Chapel, DNP, FNP-C CMA: n/a Front Desk/Registration:  Eliezer Mccoy   Time spent on call:  11 minutes on the phone discussing health concerns. 15 minutes total spent in review of patient's record and preparation of their chart. I verified patient identity using two factors (patient name and date of birth). Patient consents verbally to being seen via telemedicine visit today.

## 2021-09-05 ENCOUNTER — Other Ambulatory Visit: Payer: Self-pay | Admitting: *Deleted

## 2021-09-05 ENCOUNTER — Encounter (HOSPITAL_COMMUNITY): Payer: Self-pay | Admitting: Internal Medicine

## 2021-09-05 DIAGNOSIS — C3492 Malignant neoplasm of unspecified part of left bronchus or lung: Secondary | ICD-10-CM

## 2021-09-05 MED ORDER — OSIMERTINIB MESYLATE 80 MG PO TABS: ORAL_TABLET | Freq: Every day | ORAL | 2 refills | Status: DC

## 2021-09-11 ENCOUNTER — Telehealth: Payer: Self-pay

## 2021-09-11 NOTE — Telephone Encounter (Signed)
Pharmacist called in requesting a refill of all pt's daily maintenance meds due to pt going to a multidose bubble pack. Pharmacist would like a call back about this. Please advise.  Cb#: [phone] fax [phone]

## 2021-09-12 ENCOUNTER — Other Ambulatory Visit (HOSPITAL_COMMUNITY): Payer: Self-pay

## 2021-09-13 MED ORDER — OXCARBAZEPINE 150 MG PO TABS: ORAL_TABLET | ORAL | 5 refills | Status: DC

## 2021-09-13 MED ORDER — CITALOPRAM HYDROBROMIDE 40 MG PO TABS: ORAL_TABLET | ORAL | 5 refills | Status: DC

## 2021-09-13 MED ORDER — LEVOTHYROXINE SODIUM 50 MCG PO TABS: ORAL_TABLET | ORAL | 5 refills | Status: DC

## 2021-09-13 MED ORDER — GABAPENTIN 100 MG PO CAPS: ORAL_CAPSULE | ORAL | 5 refills | Status: DC

## 2021-09-13 MED ORDER — LEVOCETIRIZINE DIHYDROCHLORIDE 5 MG PO TABS: 5.0000 mg | ORAL_TABLET | Freq: Every day | ORAL | 5 refills | Status: DC

## 2021-09-13 NOTE — Telephone Encounter (Signed)
Pt's maintenance [facility]'s [facility] per request.

## 2021-09-13 NOTE — Addendum Note (Signed)
Addended by: Wadie Lessen on: 09/13/2021 12:38 PM   Modules accepted: Orders

## 2021-09-15 NOTE — Progress Notes (Deleted)
Chronic Care Management Pharmacy Note  09/15/2021 Name:  [name] MRN:  [id number] DOB:  November 05, 1940  Summary: Follow up with PharmD.  No changes patient continues tolerate all treatments.  Recommendations/Changes made from today's visit: NONE  Plan: 6 month fu   Subjective: [name] is an 80 y.o. year old female who is a primary patient of Pickard, Cammie Mcgee, MD.  The CCM team was consulted for assistance with disease management and care coordination needs.    Engaged with patient by telephone for follow up visit in response to provider referral for pharmacy case management and/or care coordination services.   Consent to Services:  The patient was given the following information about Chronic Care Management services today, agreed to services, and gave verbal consent: 1. CCM service includes personalized support from designated clinical staff supervised by the primary care provider, including individualized plan of care and coordination with other care providers 2. 24/7 contact phone numbers for assistance for urgent and routine care needs. 3. Service will only be billed when office clinical staff spend 20 minutes or more in a month to coordinate care. 4. Only one practitioner may furnish and bill the service in a calendar month. 5.The patient may stop CCM services at any time (effective at the end of the month) by phone call to the office staff. 6. The patient will be responsible for cost sharing (co-pay) of up to 20% of the service fee (after annual deductible is met). Patient agreed to services and consent obtained.  Patient Care Team: [name], MD as PCP - General (Family Medicine) Herminio Commons, MD (Inactive) as PCP - Cardiology (Cardiology) Danie Binder, MD (Inactive) (Gastroenterology) Nicanor Alcon, MD (Thoracic Surgery) Everardo All, MD (Hematology and Oncology) Edythe Clarity, [facility] as Pharmacist (Pharmacist)  Recent office visits: None  since last Ccm visit  Recent consult visits: 02/20/21 [name], oncology) - no med changes, still stable metastatic disease  01/19/21 [name], oncology) - regular follow up, no medication changes Hospital visits: None in previous 6 months   Objective:  Lab Results  Component Value Date   CREATININE 0.61 07/12/2021   BUN 8 07/12/2021   GFR 107.85 02/16/2016   GFRNONAA >60 07/12/2021   GFRAA >60 06/20/2020   NA 126 (L) 07/12/2021   K 4.3 07/12/2021   CALCIUM 9.0 07/12/2021   CO2 27 07/12/2021   GLUCOSE 73 07/12/2021    Lab Results  Component Value Date/Time   HGBA1C 5.1 03/08/2019 04:26 AM   GFR 107.85 02/16/2016 11:50 AM    Last diabetic Eye exam: No results found for: HMDIABEYEEXA  Last diabetic Foot exam: No results found for: HMDIABFOOTEX   Lab Results  Component Value Date   CHOL 220 (H) 03/08/2019   HDL 54 03/08/2019   LDLCALC 155 (H) 03/08/2019   TRIG 57 03/08/2019   CHOLHDL 4.1 03/08/2019    Hepatic Function Latest Ref Rng & Units 07/12/2021 06/14/2021 05/15/2021  Total Protein 6.5 - 8.1 g/dL 6.4(L) 6.5 6.2(L)  Albumin 3.5 - 5.0 g/dL 3.9 4.0 3.9  AST 15 - 41 U/L 21 17 17   ALT 0 - 44 U/L 12 9 12   Alk Phosphatase 38 - 126 U/L 58 48 40  Total Bilirubin 0.3 - 1.2 mg/dL 0.3 0.4 0.4    Lab Results  Component Value Date/Time   TSH 2.057 04/17/2021 02:02 PM   TSH 2.339 12/22/2020 10:39 AM   TSH 4.09 01/26/2020 04:23 PM   TSH 1.20 11/27/2019  02:48 PM   FREET4 0.97 06/01/2015 08:23 AM    CBC Latest Ref Rng & Units 07/12/2021 06/14/2021 05/15/2021  WBC 4.0 - 10.5 K/uL 4.3 5.8 5.4  Hemoglobin 12.0 - 15.0 g/dL 10.9(L) 11.6(L) 11.8(L)  Hematocrit 36.0 - 46.0 % 32.6(L) 34.1(L) 35.2(L)  Platelets 150 - 400 K/uL 222 250 246    No results found for: VD25OH  Clinical ASCVD: No  The ASCVD Risk score (Arnett DK, et al., 2019) failed to calculate for the following reasons:   The 2019 ASCVD risk score is only valid for ages 77 to 77    Depression screen PHQ 2/9 04/13/2021   Decreased Interest 0  Down, Depressed, Hopeless 0  PHQ - 2 Score 0  Some recent data might be hidden     Social History   Tobacco Use  Smoking Status Former   Packs/day: 0.50   Years: 20.00   Pack years: 10.00   Types: Cigarettes   Quit date: 12/21/2010   Years since quitting: 10.7  Smokeless Tobacco Never  Tobacco Comments   smoking cessation info given and reviewed    BP Readings from Last 3 Encounters:  07/12/21 (!) 125/59  06/14/21 (!) 88/64  05/18/21 114/75   Pulse Readings from Last 3 Encounters:  08/16/21 (!) 107  07/12/21 65  06/14/21 76   Wt Readings from Last 3 Encounters:  07/12/21 104 lb 12.8 oz (47.5 kg)  06/14/21 106 lb 8 oz (48.3 kg)  05/15/21 108 lb 3.2 oz (49.1 kg)   BMI Readings from Last 3 Encounters:  07/12/21 20.47 kg/m  06/14/21 20.80 kg/m  05/18/21 21.13 kg/m    Assessment/Interventions: Review of patient past medical history, allergies, medications, health status, including review of consultants reports, laboratory and other test data, was performed as part of comprehensive evaluation and provision of chronic care management services.   SDOH:  (Social Determinants of Health) assessments and interventions performed: Yes  SDOH Screenings   Alcohol Screen: Low Risk    Last Alcohol Screening Score (AUDIT): 0  Depression (PHQ2-9): Low Risk    PHQ-2 Score: 0  Financial Resource Strain: Low Risk    Difficulty of Paying Living Expenses: Not hard at all  Food Insecurity: No Food Insecurity   Worried About Charity fundraiser in the Last Year: Never true   Ran Out of Food in the Last Year: Never true  Housing: Low Risk    Last Housing Risk Score: 0  Physical Activity: Inactive   Days of Exercise per Week: 0 days   Minutes of Exercise per Session: 0 min  Social Connections: Socially Isolated   Frequency of Communication with Friends and Family: More than three times a week   Frequency of Social Gatherings with Friends and Family: More  than three times a week   Attends Religious Services: Never   Marine scientist or Organizations: No   Attends Music therapist: Never   Marital Status: Divorced  Stress: No Stress Concern Present   Feeling of Stress : Not at all  Tobacco Use: Medium Risk   Smoking Tobacco Use: Former   Smokeless Tobacco Use: Never   Passive Exposure: Not on Pensions consultant Needs: No Transportation Needs   Lack of Transportation (Medical): No   Lack of Transportation (Non-Medical): No    CCM Care Plan  Allergies  Allergen Reactions   Ciprofloxacin Hcl Hives   Sulfonamide Derivatives Hives and Rash   Ciprofloxacin Hives   Iohexol Other (See Comments)  UNSPECIFIED REACTION  Desc: Per [facility], pt is allergic to IV contrast, no type of reaction was available. Pt cannot remember but first reacted around 15 yrs ago from an IVP. Notes were date from 2009 at urology center., Onset Date: [id number]    Iodinated Diagnostic Agents Rash and Itching   Prozac [Fluoxetine Hcl] Rash    Medications Reviewed Today     Reviewed by Eulogio Bear, NP (Nurse Practitioner) on 08/16/21 at 1413  Med List Status: <None>   Medication Order Taking? Sig Documenting Provider Last Dose Status Informant  albuterol (VENTOLIN HFA) 108 (90 Base) MCG/ACT inhaler [id number] No INHALE 2 PUFFS EVERY 6 HOURS AS NEEDED FOR WHEEZING OR SHORTNESS OF BREATH. [name], MD Taking Active   ALPRAZolam Duanne Moron) 0.5 MG tablet [id number]  TAKE (1) TABLET BY MOUTH TWICE A DAY AS NEEDED. [name], MD  Active   citalopram (CELEXA) 40 MG tablet [id number]  TAKE (1/2) TABLET BY MOUTH AT BEDTIME. [name], MD  Active   denosumab (PROLIA) 60 MG/ML SOSY injection [id number]  INJECT INTO UPPER ARM, THIGH, OR ABDOMEN ONCE. [name], MD  Active   diphenhydrAMINE (BENADRYL) 50 MG tablet [id number] No Take 1 tablet (50 mg total) by mouth once for 1 dose. Take one hour prior to CT scan  Derek Jack, MD Taking Expired 04/13/21 2359   doxycycline (VIBRA-TABS) 100 MG tablet [id number] Yes Take 1 tablet (100 mg total) by mouth 2 (two) times daily. Eulogio Bear, NP  Active   ferrous sulfate 325 (65 FE) MG tablet [id number] No Take 325 mg by mouth daily with breakfast. [provider] Taking Active Self  fluticasone (FLONASE) 50 MCG/ACT nasal spray [id number] No Place 2 sprays into both nostrils daily. [name], MD Taking Active   gabapentin (NEURONTIN) 100 MG capsule [id number]  TAKE 1 CAPSULE BY MOUTH THREE TIMES A DAY. [name], MD  Active   levocetirizine (XYZAL) 5 MG tablet [id number]  TAKE ONE TABLET BY MOUTH ONCE DAILY. [name], MD  Active   levothyroxine (SYNTHROID) 50 MCG tablet [id number]  TAKE 1 TABLET BEFORE BREAKFAST. [name], MD  Active   meclizine (ANTIVERT) 12.5 MG tablet [id number]  Take 1 tablet (12.5 mg total) by mouth 3 (three) times daily as needed for dizziness. [name], MD  Active   Multiple Vitamin (MULTIVITAMIN WITH MINERALS) TABS tablet [id number] No Take 1 tablet by mouth daily. [provider] Taking Active Self  ondansetron (ZOFRAN) 4 MG tablet [id number] No TAKE 1 TABLET BY MOUTH 3 TIMES DAILY AS NEEDED FOR NAUSEA AND VOMITING. [name], MD Taking Active   osimertinib mesylate (TAGRISSO) 80 MG tablet [id number]  TAKE 1 TABLET (80 MG TOTAL) BY MOUTH DAILY. Orson Slick, MD  Active   OVER THE COUNTER MEDICATION [id number] No Bone Support 2-4 tabs daily [provider] Taking Active Self  OXcarbazepine (TRILEPTAL) 150 MG tablet [id number]  TAKE (1) TABLET BY MOUTH TWICE DAILY. [name], MD  Active   pantoprazole (PROTONIX) 40 MG tablet [id number]  TAKE (1) TABLET BY MOUTH ONCE DAILY BEFORE BREAKFAST. Mahala Menghini, PA-C  Active   predniSONE (DELTASONE) 20 MG tablet [id number] Yes Take 2 tablets (40 mg total) by mouth daily with breakfast for 5 days. Eulogio Bear, NP  Active   Discontinued 08/16/21 1335 (Completed Course)   Probiotic Product (ALIGN PO) [id number] No Take by mouth daily. [provider] Taking  Active   TRELEGY ELLIPTA 100-62.5-25 MCG/INH AEPB [id number]  INHALE 1 PUFF INTO LUNGS DAILY. [name], MD  Active   Med List Note Britt Boozer, [facility] 08/31/20 1015): Tagrisso filled through [employer] [phone] As of 03/13/2019             Patient Active Problem List   Diagnosis Date Noted   Abdominal bloating 01/13/2020   Primary cancer of left lower lobe of lung ([place]) 12/29/2019   Flatulence 11/17/2019   IBS (irritable bowel syndrome)    Acute hypoxemic respiratory failure ([place]) 05/25/2019   Pharyngeal dysphagia 05/07/2019   Brain metastasis ([place]) 04/17/2019   Brain tumor ([place]) 03/18/2019   Essential hypertension 03/12/2019   Brain metastases ([place]) 03/06/2019   Cytotoxic brain edema ([place]) 03/04/2019   ICH (intracerebral hemorrhage) ([place]) - L parietal lobe 03/03/2019   Nontraumatic intracerebral hemorrhage, unspecified ([place]) 03/03/2019   Osteoporosis 05/16/2018   S/P ORIF (open reduction internal fixation) fracture right patella 03/18/18 03/26/2018   Other specified postprocedural states 03/26/2018   Cellulitis, wound, post-operative 03/22/2018   Fracture of right patella 03/18/2018   Normocytic anemia    Esophageal dysphagia 11/27/2017   Heme positive stool 11/27/2017   Nasal crusting 02/06/2017   Iron deficiency anemia 12/05/2016   SIADH (syndrome of inappropriate ADH production) ([place])    Polypharmacy 09/14/2016   Muscle weakness (generalized)    Diastolic dysfunction 93/81/8299   Syncope 09/10/2016   Rib pain on left side    Hypersomnia 02/16/2016   Acute bronchitis 02/16/2016   Exertional dyspnea 12/20/2015   HLD (hyperlipidemia) 11/23/2014   Change in bowel habits 11/10/2014   Abdominal pain, left lower quadrant 11/10/2014   Emphysema lung ([place]) 37/16/9678   Helicobacter  pylori gastritis 07/30/2014   Acute on chronic respiratory failure with hypoxia ([place]) 04/10/2014   Wrist fracture 04/09/2014   Occlusion and stenosis of carotid artery without mention of cerebral infarction 03/16/2014   T12 burst fracture ([place]) 01/13/2014   Constipation 12/14/2013   Acute respiratory failure with hypoxia ([place]) 12/14/2013   COPD (chronic obstructive pulmonary disease) ([place]) 12/14/2013   Compression fracture of thoracic vertebra ([place]) 12/14/2013   Hypoxia 12/13/2013   Compression fracture 12/13/2013   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[place]) 08/11/2013   AAA (abdominal aortic aneurysm) without rupture ([place]) 02/26/2012   Dyspepsia 02/11/2012   Aneurysm ([place]) 02/11/2012   Trigeminal neuralgia    Epigastric pain 02/21/2011   Dysphagia 02/21/2011   Adenocarcinoma of lung ([place]) 93/81/0175   HELICOBACTER PYLORI GASTRITIS, HX OF 12/30/2009   Radiographic dye allergy status 12/30/2009   Personal history of other diseases of the digestive system 12/30/2009    Immunization History  Administered Date(s) Administered   Fluad Quad(high Dose 65+) 06/19/2019, 07/20/2020, 07/12/2021   Hepatitis B 02/17/2007, 04/17/2007, 09/12/2007   Influenza, High Dose Seasonal PF 07/11/2018   Influenza,inj,Quad PF,6+ Mos 07/12/2015, 07/12/2016, 06/11/2017   Pneumococcal Conjugate-13 11/27/2019   Pneumococcal Polysaccharide-23 07/19/2014   Td 02/17/2007   Tdap 04/09/2014   Zoster, Live 01/12/2009    Conditions to be addressed/monitored:  HTN. Adenocarcinoma of lung w/ brain metastases, COPD, HLD.  There are no care plans that you recently modified to display for this patient.      Compliance/Adherence/Medication fill history: Care Gaps: Covid-19 vaccine  Star-Rating Drugs: None  Medication Assistance: None required.  Patient affirms current coverage meets needs.   Patient's preferred pharmacy is:   [facility], [place] [address] [place]  [place] [address] Phone: [phone] Fax: [phone]   [facility], [place] [address] Phone: [phone] Fax: [phone]   Uses pill box? Yes Pt endorses 100% compliance   We discussed: Benefits of medication synchronization, packaging and delivery as well as enhanced pharmacist oversight with [facility]. Patient decided to: Continue current medication management strategy - Has been with [facility] for years   Follow Up:  Patient agrees to Care Plan and Follow-up.   Plan: The patient has been provided with contact information for the care management team and has been advised to call with any health related questions or concerns.    Beverly Milch, PharmD Clinical Pharmacist [facility] [phone]    Current Barriers:  No true barriers identified at this visit   Pharmacist Clinical Goal(s):  Over the next 120 days, patient will work toward alleviating adverse effects from treatment through collaboration with PharmD and provider.   Interventions: 1:1 collaboration with [name], MD regarding development and update of comprehensive plan of care as evidenced by provider attestation and co-signature Inter-disciplinary care team collaboration (see longitudinal plan of care) Comprehensive medication review performed; medication list updated in electronic medical record  Hypertension (BP goal <140/90) -controlled -Current treatment: No medications at this time -Medications previously tried: none -Current home readings: none available  -Denies hypotensive/hypertensive symptoms -Educated on Importance of home blood pressure monitoring; Symptoms of hypotension and importance of maintaining adequate hydration; -Counseled to monitor BP at home periodically, document, and provide log at future appointments -Counseled on diet and exercise extensively  - Continue  current management strategy  Update 03/20/21 No changes to meds Denies any dizziness or HA'S  Hyperlipidemia: (LDL goal < 100) -uncontrolled -Current treatment: No medications -Medications previously tried: pravastatin, atorvastatin (unkown why d/c)  -Educated on Cholesterol goals;  -Counseled on diet and exercise extensively recommend to continue current management strategy, would hesitate to recommend statin given her age and other comonbidities   COPD (Goal: control symptoms and prevent exacerbations) -controlled -Current treatment  Trelegy 100-62.5-25 mcg one puff daily Albuterol HFA 90 mcg -Medications previously tried: none noted   -Exacerbations requiring treatment in last 6 months:  none -Patient reports consistent use of maintenance inhaler -Frequency of rescue inhaler use: rarely -Counseled on Proper inhaler technique; Differences between maintenance and rescue inhalers -Recommended to continue current medication Assessed frequency of SOB episodes and inhaler usage  Update 03/20/21 Continues to use Trelegy as maintenance Rarely has to use Albuterol Reports breathing is controlled  Lung Cancer w/ brain metastases (Goal: Comfort care, minimize symptoms and adverse effects from treatment) -controlled -Current treatment  Tagrisso 84m  -Medications previously tried: none noted  -Counseled on use of O'Keefes working hands cream for patient having extreme dry hands and fingertips as a result of new treatment with Tagrisso  - Lesions on brain have cleared - Recommend continue current medication  Update 03/20/21 Continues to have dry hands and fingers, O'keefes has been somewhat effective States it is tolerable and she continues to be in good spirits  Patient Goals/Self-Care Activities Over the next 120 days, patient will:  - take medications as prescribed Try O'Keefes working hand cream and report any increasing or worsening adverse effects to  providers  Follow Up Plan: The care management team will reach out to the patient again over the next 120 days.

## 2021-09-18 ENCOUNTER — Encounter: Payer: Self-pay | Admitting: Family Medicine

## 2021-09-18 ENCOUNTER — Other Ambulatory Visit (HOSPITAL_COMMUNITY): Payer: Self-pay

## 2021-09-18 ENCOUNTER — Inpatient Hospital Stay: Payer: PPO

## 2021-09-19 ENCOUNTER — Telehealth: Payer: Self-pay

## 2021-09-19 ENCOUNTER — Other Ambulatory Visit: Payer: Self-pay | Admitting: Family Medicine
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
## 2021-09-20 ENCOUNTER — Telehealth: Payer: Self-pay

## 2021-09-20 DIAGNOSIS — J449 Chronic obstructive pulmonary disease, unspecified: Secondary | ICD-10-CM

## 2021-09-20 DIAGNOSIS — C3490 Malignant neoplasm of unspecified part of unspecified bronchus or lung: Secondary | ICD-10-CM

## 2021-09-20 NOTE — Telephone Encounter (Signed)
Spoke with pt as there seems to be some confusion about what she has been requesting. Pt currently has O2 concentrators from [facility]. Pt states she is moving back to this area because her health insurance ([employer]) is not covering her out of the area. Pt would like to start O2 services with [facility]. Pt has been advised there are local [facility] offices here and that she could continue services with them, however pt is adamant about stopping their service and getting new equipment from [facility]. Pt states she would like service to start 10/03/21. Pt is aware not to turn in current equipment until she has new services established.   New order for O2 placed for [facility]. Pt has already been advised she will likely need a face-to-face visit for this to be approved, pt has not been seen in office since 01/26/2020.

## 2021-09-20 NOTE — Telephone Encounter (Signed)
Please see TE dated 09/20/21 for further information.

## 2021-09-21 ENCOUNTER — Other Ambulatory Visit: Payer: Self-pay | Admitting: Family Medicine

## 2021-09-21 ENCOUNTER — Ambulatory Visit: Payer: PPO | Admitting: Internal Medicine

## 2021-09-21 NOTE — Telephone Encounter (Signed)
Pt already has portable O2 with [facility]. Pt is wanting to switch companies to [facility].

## 2021-09-22 ENCOUNTER — Telehealth: Payer: Self-pay | Admitting: Family Medicine

## 2021-09-22 ENCOUNTER — Other Ambulatory Visit: Payer: Self-pay | Admitting: Family Medicine

## 2021-09-22 MED ORDER — ALPRAZOLAM 0.5 MG PO TABS: ORAL_TABLET | ORAL | 0 refills | Status: DC

## 2021-09-22 NOTE — Telephone Encounter (Signed)
Patient called to follow up on refill request from pharmacy for ALPRAZolam Duanne Moron) 0.5 MG tablet [[id number]]   Patient requesting rx be sent in asap (today); patient will run out of medication on 12/20.   Pharmacy confirmed as  [facility], [place]  [address], [place] [address]  Phone:  [phone]  Fax:  [phone]   Please advise at [phone].

## 2021-09-23 ENCOUNTER — Encounter: Payer: Self-pay | Admitting: Family Medicine

## 2021-09-25 ENCOUNTER — Telehealth: Payer: Self-pay

## 2021-10-01 DIAGNOSIS — C3490 Malignant neoplasm of unspecified part of unspecified bronchus or lung: Secondary | ICD-10-CM | POA: Diagnosis not present

## 2021-10-01 DIAGNOSIS — R0902 Hypoxemia: Secondary | ICD-10-CM | POA: Diagnosis not present

## 2021-10-01 DIAGNOSIS — J449 Chronic obstructive pulmonary disease, unspecified: Secondary | ICD-10-CM | POA: Diagnosis not present

## 2021-10-01 DIAGNOSIS — J9621 Acute and chronic respiratory failure with hypoxia: Secondary | ICD-10-CM | POA: Diagnosis not present

## 2021-10-06 ENCOUNTER — Other Ambulatory Visit (HOSPITAL_COMMUNITY): Payer: Self-pay

## 2021-10-06 ENCOUNTER — Ambulatory Visit (INDEPENDENT_AMBULATORY_CARE_PROVIDER_SITE_OTHER): Payer: PPO | Admitting: Pharmacist

## 2021-10-06 DIAGNOSIS — I10 Essential (primary) hypertension: Secondary | ICD-10-CM

## 2021-10-06 DIAGNOSIS — J449 Chronic obstructive pulmonary disease, unspecified: Secondary | ICD-10-CM

## 2021-10-06 NOTE — Progress Notes (Signed)
Chronic Care Management Pharmacy Note  10/06/2021 Name:  [name] MRN:  [id number] DOB:  08-Nov-1940  Summary: Follow up with PharmD.  No changes patient continues tolerate all treatments.  Recommendations/Changes made from today's visit: NONE  Plan: 6 month fu   Subjective: [name] is an 80 y.o. year old female who is a primary patient of Pickard, Cammie Mcgee, MD.  The CCM team was consulted for assistance with disease management and care coordination needs.    Engaged with patient by telephone for follow up visit in response to provider referral for pharmacy case management and/or care coordination services.   Consent to Services:  The patient was given the following information about Chronic Care Management services today, agreed to services, and gave verbal consent: 1. CCM service includes personalized support from designated clinical staff supervised by the primary care provider, including individualized plan of care and coordination with other care providers 2. 24/7 contact phone numbers for assistance for urgent and routine care needs. 3. Service will only be billed when office clinical staff spend 20 minutes or more in a month to coordinate care. 4. Only one practitioner may furnish and bill the service in a calendar month. 5.The patient may stop CCM services at any time (effective at the end of the month) by phone call to the office staff. 6. The patient will be responsible for cost sharing (co-pay) of up to 20% of the service fee (after annual deductible is met). Patient agreed to services and consent obtained.  Patient Care Team: [name], MD as PCP - General (Family Medicine) Herminio Commons, MD (Inactive) as PCP - Cardiology (Cardiology) Danie Binder, MD (Inactive) (Gastroenterology) Nicanor Alcon, MD (Thoracic Surgery) Everardo All, MD (Hematology and Oncology) Edythe Clarity, [facility] as Pharmacist (Pharmacist)  Recent office  visits: None since last Ccm visit  Recent consult visits: 02/20/21 [name], oncology) - no med changes, still stable metastatic disease  01/19/21 [name], oncology) - regular follow up, no medication changes Hospital visits: None in previous 6 months   Objective:  Lab Results  Component Value Date   CREATININE 0.61 07/12/2021   BUN 8 07/12/2021   GFR 107.85 02/16/2016   GFRNONAA >60 07/12/2021   GFRAA >60 06/20/2020   NA 126 (L) 07/12/2021   K 4.3 07/12/2021   CALCIUM 9.0 07/12/2021   CO2 27 07/12/2021   GLUCOSE 73 07/12/2021    Lab Results  Component Value Date/Time   HGBA1C 5.1 03/08/2019 04:26 AM   GFR 107.85 02/16/2016 11:50 AM    Last diabetic Eye exam: No results found for: HMDIABEYEEXA  Last diabetic Foot exam: No results found for: HMDIABFOOTEX   Lab Results  Component Value Date   CHOL 220 (H) 03/08/2019   HDL 54 03/08/2019   LDLCALC 155 (H) 03/08/2019   TRIG 57 03/08/2019   CHOLHDL 4.1 03/08/2019    Hepatic Function Latest Ref Rng & Units 07/12/2021 06/14/2021 05/15/2021  Total Protein 6.5 - 8.1 g/dL 6.4(L) 6.5 6.2(L)  Albumin 3.5 - 5.0 g/dL 3.9 4.0 3.9  AST 15 - 41 U/L 21 17 17   ALT 0 - 44 U/L 12 9 12   Alk Phosphatase 38 - 126 U/L 58 48 40  Total Bilirubin 0.3 - 1.2 mg/dL 0.3 0.4 0.4    Lab Results  Component Value Date/Time   TSH 2.057 04/17/2021 02:02 PM   TSH 2.339 12/22/2020 10:39 AM   TSH 4.09 01/26/2020 04:23 PM   TSH 1.20 11/27/2019  02:48 PM   FREET4 0.97 06/01/2015 08:23 AM    CBC Latest Ref Rng & Units 07/12/2021 06/14/2021 05/15/2021  WBC 4.0 - 10.5 K/uL 4.3 5.8 5.4  Hemoglobin 12.0 - 15.0 g/dL 10.9(L) 11.6(L) 11.8(L)  Hematocrit 36.0 - 46.0 % 32.6(L) 34.1(L) 35.2(L)  Platelets 150 - 400 K/uL 222 250 246    No results found for: VD25OH  Clinical ASCVD: No  The ASCVD Risk score (Arnett DK, et al., 2019) failed to calculate for the following reasons:   The 2019 ASCVD risk score is only valid for ages 1 to 49    Depression screen PHQ  2/9 04/13/2021  Decreased Interest 0  Down, Depressed, Hopeless 0  PHQ - 2 Score 0  Some recent data might be hidden     Social History   Tobacco Use  Smoking Status Former   Packs/day: 0.50   Years: 20.00   Pack years: 10.00   Types: Cigarettes   Quit date: 12/21/2010   Years since quitting: 10.8  Smokeless Tobacco Never  Tobacco Comments   smoking cessation info given and reviewed    BP Readings from Last 3 Encounters:  07/12/21 (!) 125/59  06/14/21 (!) 88/64  05/18/21 114/75   Pulse Readings from Last 3 Encounters:  08/16/21 (!) 107  07/12/21 65  06/14/21 76   Wt Readings from Last 3 Encounters:  07/12/21 104 lb 12.8 oz (47.5 kg)  06/14/21 106 lb 8 oz (48.3 kg)  05/15/21 108 lb 3.2 oz (49.1 kg)   BMI Readings from Last 3 Encounters:  07/12/21 20.47 kg/m  06/14/21 20.80 kg/m  05/18/21 21.13 kg/m    Assessment/Interventions: Review of patient past medical history, allergies, medications, health status, including review of consultants reports, laboratory and other test data, was performed as part of comprehensive evaluation and provision of chronic care management services.   SDOH:  (Social Determinants of Health) assessments and interventions performed: Yes  SDOH Screenings   Alcohol Screen: Low Risk    Last Alcohol Screening Score (AUDIT): 0  Depression (PHQ2-9): Low Risk    PHQ-2 Score: 0  Financial Resource Strain: Low Risk    Difficulty of Paying Living Expenses: Not hard at all  Food Insecurity: No Food Insecurity   Worried About Charity fundraiser in the Last Year: Never true   Ran Out of Food in the Last Year: Never true  Housing: Low Risk    Last Housing Risk Score: 0  Physical Activity: Inactive   Days of Exercise per Week: 0 days   Minutes of Exercise per Session: 0 min  Social Connections: Socially Isolated   Frequency of Communication with Friends and Family: More than three times a week   Frequency of Social Gatherings with Friends and  Family: More than three times a week   Attends Religious Services: Never   Marine scientist or Organizations: No   Attends Music therapist: Never   Marital Status: Divorced  Stress: No Stress Concern Present   Feeling of Stress : Not at all  Tobacco Use: Medium Risk   Smoking Tobacco Use: Former   Smokeless Tobacco Use: Never   Passive Exposure: Not on Pensions consultant Needs: No Transportation Needs   Lack of Transportation (Medical): No   Lack of Transportation (Non-Medical): No    CCM Care Plan  Allergies  Allergen Reactions   Ciprofloxacin Hcl Hives   Sulfonamide Derivatives Hives and Rash   Ciprofloxacin Hives   Iohexol Other (See Comments)  UNSPECIFIED REACTION  Desc: Per [facility], pt is allergic to IV contrast, no type of reaction was available. Pt cannot remember but first reacted around 15 yrs ago from an IVP. Notes were date from 2009 at urology center., Onset Date: [id number]    Iodinated Contrast Media Rash and Itching   Prozac [Fluoxetine Hcl] Rash    Medications Reviewed Today     Reviewed by Edythe Clarity, [facility] (Pharmacist) on 10/06/21 at [phone]  Med List Status: <None>   Medication Order Taking? Sig Documenting Provider Last Dose Status Informant  albuterol (VENTOLIN HFA) 108 (90 Base) MCG/ACT inhaler [id number] Yes INHALE 2 PUFFS EVERY 6 HOURS AS NEEDED FOR WHEEZING OR SHORTNESS OF BREATH. [name], MD Taking Active   ALPRAZolam Duanne Moron) 0.5 MG tablet [id number] Yes TAKE (1) TABLET BY MOUTH TWICE A DAY AS NEEDED. [name], MD Taking Active   citalopram (CELEXA) 40 MG tablet [id number] Yes TAKE (1/2) TABLET BY MOUTH AT BEDTIME. [name], MD Taking Active   denosumab (PROLIA) 60 MG/ML SOSY injection [id number] Yes INJECT INTO UPPER ARM, THIGH, OR ABDOMEN ONCE. [name], MD Taking Active   diphenhydrAMINE (BENADRYL) 50 MG tablet [id number]  Take 1 tablet (50 mg total) by mouth once for 1 dose. Take  one hour prior to CT scan Derek Jack, MD  Expired 04/13/21 2359   ferrous sulfate 325 (65 FE) MG tablet [id number] Yes Take 325 mg by mouth daily with breakfast. [provider] Taking Active Self  fluticasone (FLONASE) 50 MCG/ACT nasal spray [id number] Yes Place 2 sprays into both nostrils daily. [name], MD Taking Active   gabapentin (NEURONTIN) 100 MG capsule [id number] Yes TAKE 1 CAPSULE BY MOUTH THREE TIMES A DAY. [name], MD Taking Active   levocetirizine (XYZAL) 5 MG tablet [id number] Yes Take 1 tablet (5 mg total) by mouth daily. [name], MD Taking Active   levothyroxine (SYNTHROID) 50 MCG tablet [id number] Yes TAKE 1 TABLET BEFORE BREAKFAST. [name], MD Taking Active   meclizine (ANTIVERT) 25 MG tablet [id number] Yes Take 12.5 mg by mouth 3 (three) times daily as needed. [provider] Taking Active   Multiple Vitamin (MULTIVITAMIN WITH MINERALS) TABS tablet [id number] Yes Take 1 tablet by mouth daily. [provider] Taking Active Self  ondansetron (ZOFRAN) 4 MG tablet [id number] Yes TAKE 1 TABLET BY MOUTH 3 TIMES DAILY AS NEEDED FOR NAUSEA AND VOMITING. [name], MD Taking Active   osimertinib mesylate (TAGRISSO) 80 MG tablet [id number] Yes TAKE 1 TABLET (80 MG TOTAL) BY MOUTH DAILY. Orson Slick, MD Taking Active   OVER THE COUNTER MEDICATION [id number] Yes Bone Support 2-4 tabs daily [provider] Taking Active Self  OXcarbazepine (TRILEPTAL) 150 MG tablet [id number] No TAKE (1) TABLET BY MOUTH TWICE DAILY. [name], MD Unknown Active   pantoprazole (PROTONIX) 40 MG tablet [id number] Yes TAKE (1) TABLET BY MOUTH ONCE DAILY BEFORE BREAKFAST. Mahala Menghini, PA-C Taking Active   Probiotic Product (ALIGN PO) [id number] Yes Take by mouth daily. [provider] Taking Active   TRELEGY ELLIPTA 100-62.5-25 MCG/ACT AEPB [id number] Yes INHALE 1 PUFF INTO LUNGS DAILY. [name], MD  Taking Active   Med List Note Britt Boozer, [facility] 08/31/20 1015): Tagrisso filled through [employer] [phone] As of 03/13/2019             Patient Active Problem List   Diagnosis Date Noted   Abdominal bloating  01/13/2020   Primary cancer of left lower lobe of lung ([place]) 12/29/2019   Flatulence 11/17/2019   IBS (irritable bowel syndrome)    Acute hypoxemic respiratory failure ([place]) 05/25/2019   Pharyngeal dysphagia 05/07/2019   Brain metastasis ([place]) 04/17/2019   Brain tumor ([place]) 03/18/2019   Essential hypertension 03/12/2019   Brain metastases ([place]) 03/06/2019   Cytotoxic brain edema ([place]) 03/04/2019   ICH (intracerebral hemorrhage) ([place]) - L parietal lobe 03/03/2019   Nontraumatic intracerebral hemorrhage, unspecified ([place]) 03/03/2019   Osteoporosis 05/16/2018   S/P ORIF (open reduction internal fixation) fracture right patella 03/18/18 03/26/2018   Other specified postprocedural states 03/26/2018   Cellulitis, wound, post-operative 03/22/2018   Fracture of right patella 03/18/2018   Normocytic anemia    Esophageal dysphagia 11/27/2017   Heme positive stool 11/27/2017   Nasal crusting 02/06/2017   Iron deficiency anemia 12/05/2016   SIADH (syndrome of inappropriate ADH production) ([place])    Polypharmacy 09/14/2016   Muscle weakness (generalized)    Diastolic dysfunction 19/41/7408   Syncope 09/10/2016   Rib pain on left side    Hypersomnia 02/16/2016   Acute bronchitis 02/16/2016   Exertional dyspnea 12/20/2015   HLD (hyperlipidemia) 11/23/2014   Change in bowel habits 11/10/2014   Abdominal pain, left lower quadrant 11/10/2014   Emphysema lung ([place]) 14/48/1856   Helicobacter pylori gastritis 07/30/2014   Acute on chronic respiratory failure with hypoxia ([place]) 04/10/2014   Wrist fracture 04/09/2014   Occlusion and stenosis of carotid artery without mention of cerebral infarction 03/16/2014   T12 burst fracture ([place]) 01/13/2014   Constipation  12/14/2013   Acute respiratory failure with hypoxia ([place]) 12/14/2013   COPD (chronic obstructive pulmonary disease) ([place]) 12/14/2013   Compression fracture of thoracic vertebra ([place]) 12/14/2013   Hypoxia 12/13/2013   Compression fracture 12/13/2013   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[place]) 08/11/2013   AAA (abdominal aortic aneurysm) without rupture ([place]) 02/26/2012   Dyspepsia 02/11/2012   Aneurysm ([place]) 02/11/2012   Trigeminal neuralgia    Epigastric pain 02/21/2011   Dysphagia 02/21/2011   Adenocarcinoma of lung ([place]) 31/49/7026   HELICOBACTER PYLORI GASTRITIS, HX OF 12/30/2009   Radiographic dye allergy status 12/30/2009   Personal history of other diseases of the digestive system 12/30/2009    Immunization History  Administered Date(s) Administered   Fluad Quad(high Dose 65+) 06/19/2019, 07/20/2020, 07/12/2021   Hepatitis B 02/17/2007, 04/17/2007, 09/12/2007   Influenza, High Dose Seasonal PF 07/11/2018   Influenza,inj,Quad PF,6+ Mos 07/12/2015, 07/12/2016, 06/11/2017   Pneumococcal Conjugate-13 11/27/2019   Pneumococcal Polysaccharide-23 07/19/2014   Td 02/17/2007   Tdap 04/09/2014   Zoster, Live 01/12/2009    Conditions to be addressed/monitored:  HTN. Adenocarcinoma of lung w/ brain metastases, COPD, HLD.  Care Plan : General Pharmacy (Adult)  Updates made by Edythe Clarity, [facility] since 10/06/2021 12:00 AM     Problem: HTN. Adenocarcinoma of lung w/ brain metastases, COPD, HLD   Priority: High  Onset Date: 11/15/2020     Long-Range Goal: Patient-Specific Goal   Start Date: 11/15/2020  Expected End Date: 05/15/2021  Recent Progress: On track  Priority: High  Note:   Current Barriers:  No true barriers identified at this visit   Pharmacist Clinical Goal(s):  Over the next 120 days, patient will work toward alleviating adverse effects from treatment through collaboration with PharmD and provider.   Interventions: 1:1 collaboration with [name], MD regarding  development and update of comprehensive plan of care as evidenced by provider attestation and co-signature  Inter-disciplinary care team collaboration (see longitudinal plan of care) Comprehensive medication review performed; medication list updated in electronic medical record  Hypertension (BP goal <140/90) -controlled -Current treatment: No medications at this time -Medications previously tried: none -Current home readings: none available  -Denies hypotensive/hypertensive symptoms -Educated on Importance of home blood pressure monitoring; Symptoms of hypotension and importance of maintaining adequate hydration; -Counseled to monitor BP at home periodically, document, and provide log at future appointments -Counseled on diet and exercise extensively  - Continue current management strategy  Update 03/20/21 No changes to meds Denies any dizziness or HA'S  Update 10/06/21 No signs of elevated BP at home. No dizziness or HA's - limiting herself to one cup of coffee per day. No changes to medications needed at this time. Continue current management.  Hyperlipidemia: (LDL goal < 100) -uncontrolled -Current treatment: No medications -Medications previously tried: pravastatin, atorvastatin (unkown why d/c)  -Educated on Cholesterol goals;  -Counseled on diet and exercise extensively recommend to continue current management strategy, would hesitate to recommend statin given her age and other comonbidities   COPD (Goal: control symptoms and prevent exacerbations) -controlled -Current treatment  Trelegy 100-62.5-25 mcg one puff daily Albuterol HFA 90 mcg -Medications previously tried: none noted  -Exacerbations requiring treatment in last 6 months:  none -Patient reports consistent use of maintenance inhaler -Frequency of rescue inhaler use: rarely -Counseled on Proper inhaler technique; Differences between maintenance and rescue inhalers -Recommended to continue current  medication Assessed frequency of SOB episodes and inhaler usage  Update 03/20/21 Continues to use Trelegy as maintenance Rarely has to use Albuterol Reports breathing is controlled  Update 10/06/21 She reports her oxygen level is the best it has been in a while.  Continues to use inhalers but not having to use her rescue as much! Does get short of breath occasionally during over exertion. She is now living with her granddaughter in [place], [place] and is very thankful to be able to do so. No changes to meds needed, continue supportive care.  [facility] [place] - no refills needed at this time.  Lung Cancer w/ brain metastases (Goal: Comfort care, minimize symptoms and adverse effects from treatment) -controlled -Current treatment  Tagrisso 70m  -Medications previously tried: none noted  -Counseled on use of O'Keefes working hands cream for patient having extreme dry hands and fingertips as a result of new treatment with Tagrisso  - Lesions on brain have cleared - Recommend continue current medication  Update 03/20/21 Continues to have dry hands and fingers, O'keefes has been somewhat effective States it is tolerable and she continues to be in good spirits  Patient Goals/Self-Care Activities Over the next 120 days, patient will:  - take medications as prescribed Try O'Keefes working hand cream and report any increasing or worsening adverse effects to providers  Follow Up Plan: The care management team will reach out to the patient again over the next 120 days.              Compliance/Adherence/Medication fill history: Care Gaps: Covid-19 vaccine  Star-Rating Drugs: None  Medication Assistance: None required.  Patient affirms current coverage meets needs.   Patient's preferred pharmacy is:   [facility] [place] [facility] [address] [place]Phone: [phone]: [phone]   [place]Phone: [phone]: [phone]  Uses pill box? Yes  Pt endorses 100% compliance   We discussed: Benefits of medication synchronization, packaging and delivery as well as enhanced pharmacist oversight with [employer]. Patient decided to: Continue current medication management strategy - Has been with [facility] for years   Follow Up:  Patient agrees to Care Plan and Follow-up.   Plan: The patient has been provided with contact information for the care management team and has been advised to call with any health related questions or concerns.    Beverly Milch, PharmD Clinical Pharmacist [place] [phone]

## 2021-10-06 NOTE — Patient Instructions (Addendum)
Visit Information   Goals Addressed             This Visit's Progress    Manage Fatigue (Tiredness- Cancer Treatment)   On track    Follow Up Date 10/06/21   - eat healthy - get outdoors every day (weather permitting) - use devices that will help like a cane, sock-puller or reacher    Why is this important?   Cancer treatment and its side effects can drain your energy. It can keep you from doing things you would like to do.  There are many things that you can do to manage fatigue.    Notes       Patient Care Plan: General Pharmacy (Adult)     Problem Identified: HTN. Adenocarcinoma of lung w/ brain metastases, COPD, HLD   Priority: High  Onset Date: 11/15/2020     Long-Range Goal: Patient-Specific Goal   Start Date: 11/15/2020  Expected End Date: 05/15/2021  Recent Progress: On track  Priority: High  Note:   Current Barriers:  No true barriers identified at this visit   Pharmacist Clinical Goal(s):  Over the next 120 days, patient will work toward alleviating adverse effects from treatment through collaboration with PharmD and provider.   Interventions: 1:1 collaboration with [name], MD regarding development and update of comprehensive plan of care as evidenced by provider attestation and co-signature Inter-disciplinary care team collaboration (see longitudinal plan of care) Comprehensive medication review performed; medication list updated in electronic medical record  Hypertension (BP goal <140/90) -controlled -Current treatment: No medications at this time -Medications previously tried: none -Current home readings: none available  -Denies hypotensive/hypertensive symptoms -Educated on Importance of home blood pressure monitoring; Symptoms of hypotension and importance of maintaining adequate hydration; -Counseled to monitor BP at home periodically, document, and provide log at future appointments -Counseled on diet and exercise extensively  -  Continue current management strategy  Update 03/20/21 No changes to meds Denies any dizziness or HA'S  Update 10/06/21 No signs of elevated BP at home. No dizziness or HA's - limiting herself to one cup of coffee per day. No changes to medications needed at this time. Continue current management.  Hyperlipidemia: (LDL goal < 100) -uncontrolled -Current treatment: No medications -Medications previously tried: pravastatin, atorvastatin (unkown why d/c)  -Educated on Cholesterol goals;  -Counseled on diet and exercise extensively recommend to continue current management strategy, would hesitate to recommend statin given her age and other comonbidities   COPD (Goal: control symptoms and prevent exacerbations) -controlled -Current treatment  Trelegy 100-62.5-25 mcg one puff daily Albuterol HFA 90 mcg -Medications previously tried: none noted  -Exacerbations requiring treatment in last 6 months:  none -Patient reports consistent use of maintenance inhaler -Frequency of rescue inhaler use: rarely -Counseled on Proper inhaler technique; Differences between maintenance and rescue inhalers -Recommended to continue current medication Assessed frequency of SOB episodes and inhaler usage  Update 03/20/21 Continues to use Trelegy as maintenance Rarely has to use Albuterol Reports breathing is controlled  Update 10/06/21 She reports her oxygen level is the best it has been in a while.  Continues to use inhalers but not having to use her rescue as much! Does get short of breath occasionally during over exertion. She is now living with her granddaughter in [place], [place] and is very thankful to be able to do so. No changes to meds needed, continue supportive care.  [facility] [place] - no refills needed at this time.  Lung  Cancer w/ brain metastases (Goal: Comfort care, minimize symptoms and adverse effects from treatment) -controlled -Current treatment   Tagrisso 80mg   -Medications previously tried: none noted  -Counseled on use of O'Keefes working hands cream for patient having extreme dry hands and fingertips as a result of new treatment with Tagrisso  - Lesions on brain have cleared - Recommend continue current medication  Update 03/20/21 Continues to have dry hands and fingers, O'keefes has been somewhat effective States it is tolerable and she continues to be in good spirits  Patient Goals/Self-Care Activities Over the next 120 days, patient will:  - take medications as prescribed Try O'Keefes working hand cream and report any increasing or worsening adverse effects to providers  Follow Up Plan: The care management team will reach out to the patient again over the next 120 days.           Patient verbalizes understanding of instructions provided today and agrees to view in [employer].  Telephone follow up appointment with pharmacy team member scheduled for: 6 months  Edythe Clarity, [facility] \ Beverly Milch, PharmD, CPP Clinical Pharmacist Practitioner [place] [phone]

## 2021-10-07 DIAGNOSIS — I10 Essential (primary) hypertension: Secondary | ICD-10-CM | POA: Diagnosis not present

## 2021-10-07 DIAGNOSIS — J449 Chronic obstructive pulmonary disease, unspecified: Secondary | ICD-10-CM | POA: Diagnosis not present

## 2021-10-09 IMAGING — CT CT ABD-PELV W/ CM
2 of 5 series · 16 of 46 positions shown, 18 images · IV contrast (omnipaque)
Comparison: PET 09/14/2019 and CT abdomen pelvis 06/04/2019.

CLINICAL DATA: Abdominal pain.  Lung cancer.

EXAM:
CT ABDOMEN AND PELVIS WITH CONTRAST
TECHNIQUE: Multidetector CT imaging of the abdomen and pelvis was performed
using the standard protocol following bolus administration of
intravenous contrast.
CONTRAST:  75mL OMNIPAQUE IOHEXOL 300 MG/ML  SOLN

[Series 2: axial (person_name) (person_name) · axial · 0.64mm/px · z∈[+924,+1274]mm · 13 of 82 slices shown, 15 images]
[im 6/82  soft-tissue]
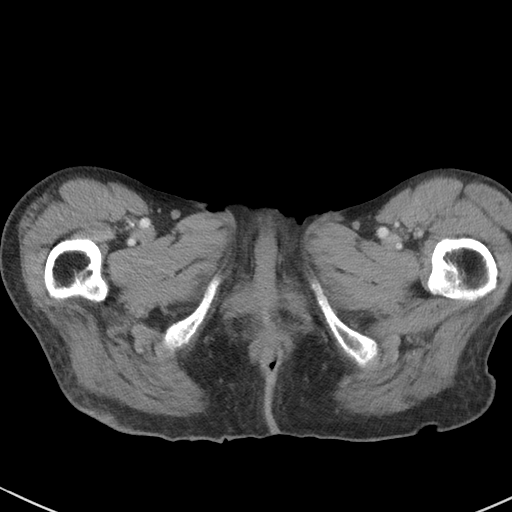
[im 6/82  bone]
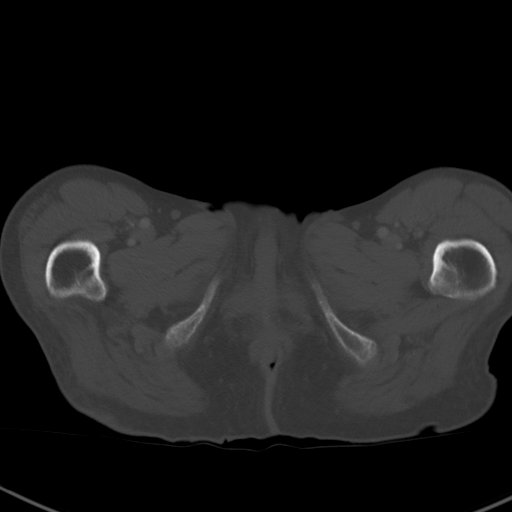
[im 12/82  soft-tissue]
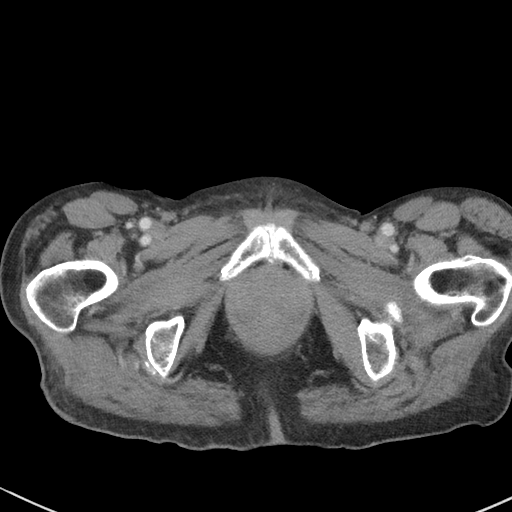
[im 18/82  soft-tissue]
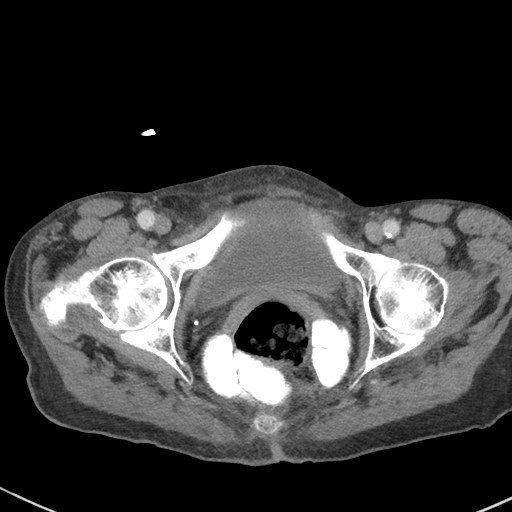
[im 24/82  soft-tissue]
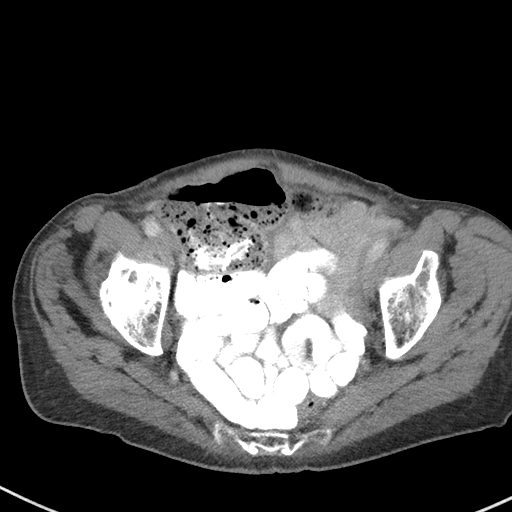
[im 29/82  soft-tissue]
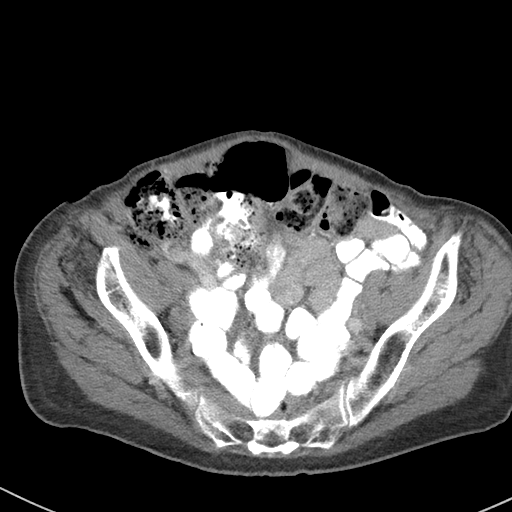
[im 35/82  soft-tissue]
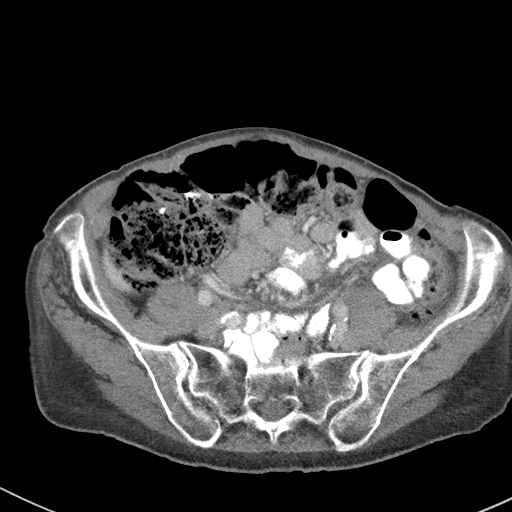
[im 41/82  soft-tissue]
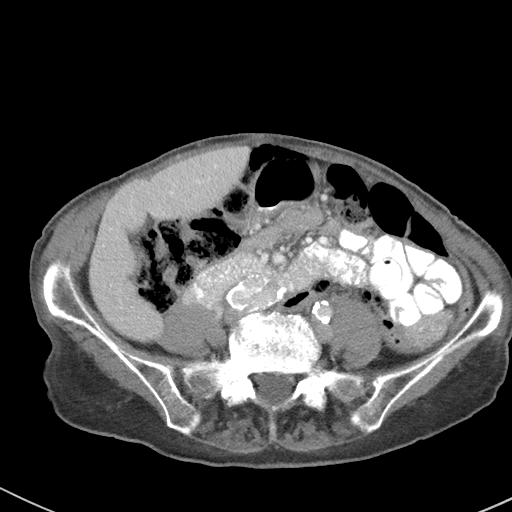
[im 47/82  soft-tissue]
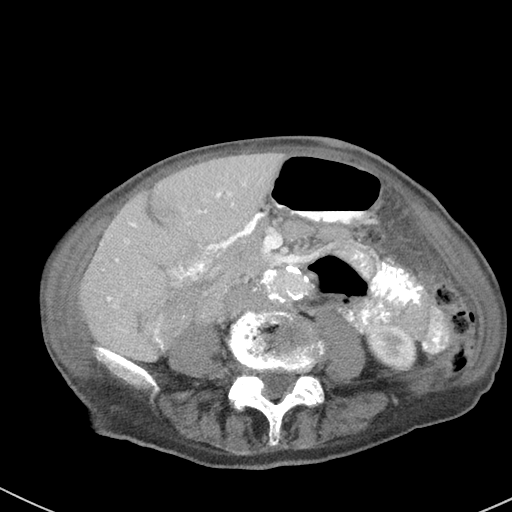
[im 53/82  soft-tissue]
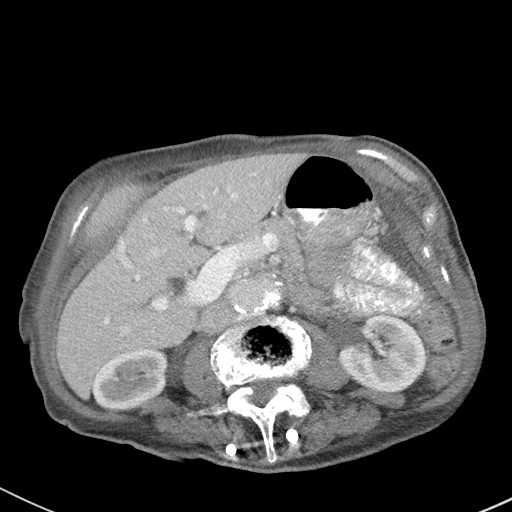
[im 53/82  bone]
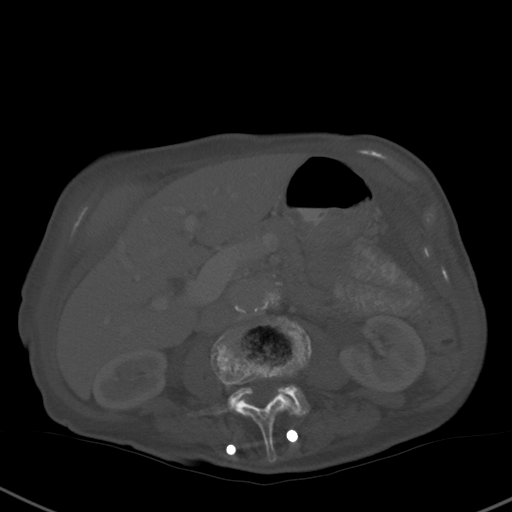
[im 58/82  soft-tissue]
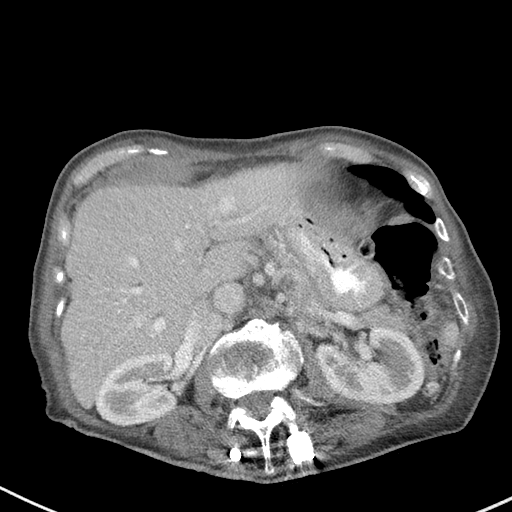
[im 64/82  soft-tissue]
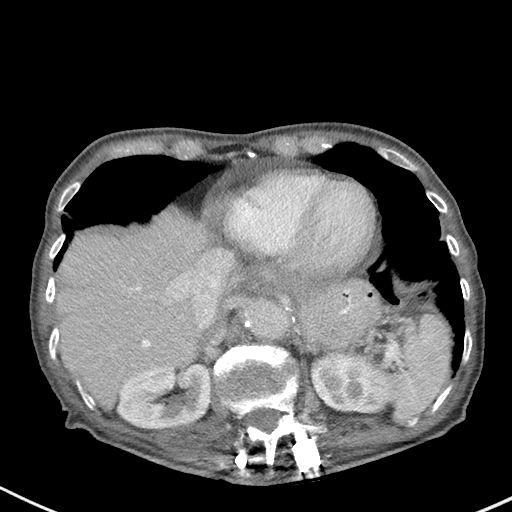
[im 70/82  soft-tissue]
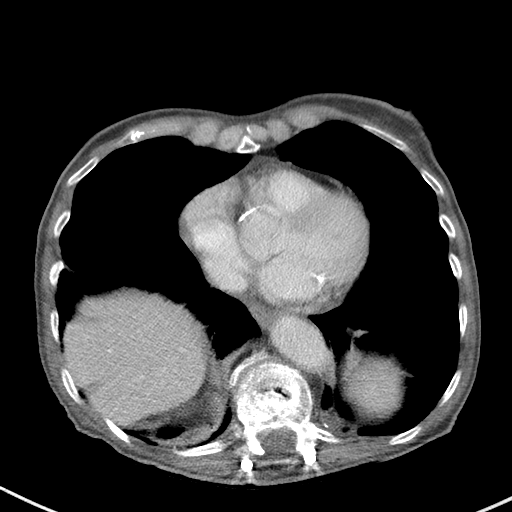
[im 76/82  soft-tissue]
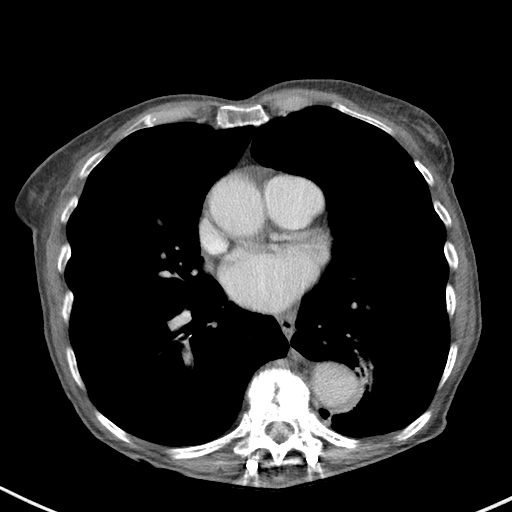

[Series 5: coronal st · coronal · 0.64mm/px · 3 of 84 slices shown]
[im 28/84  soft-tissue]
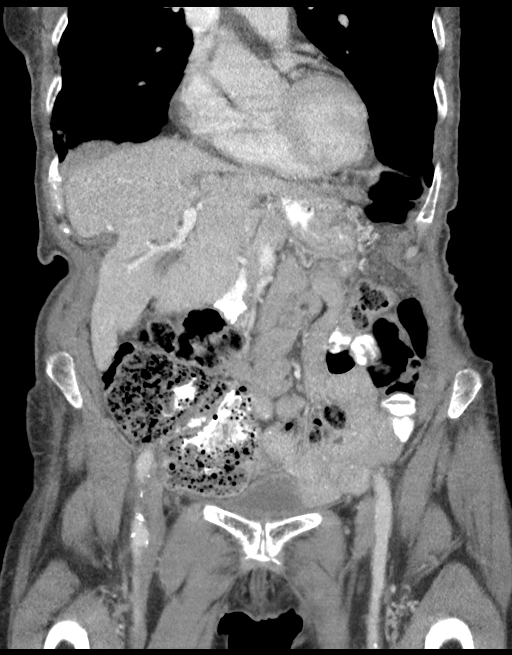
[im 37/84  soft-tissue]
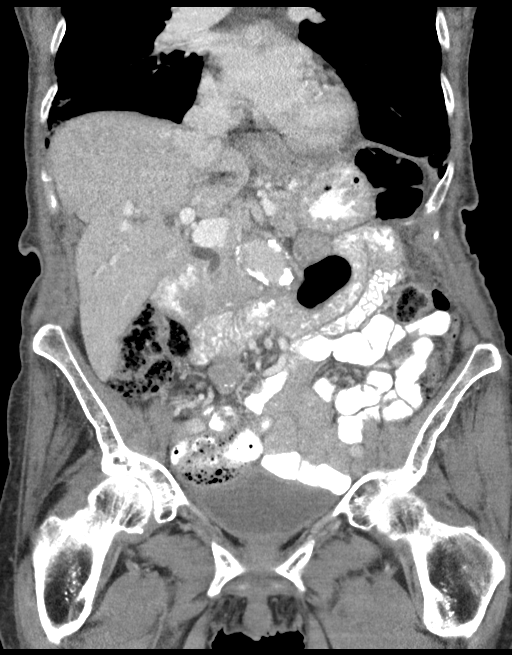
[im 47/84  soft-tissue]
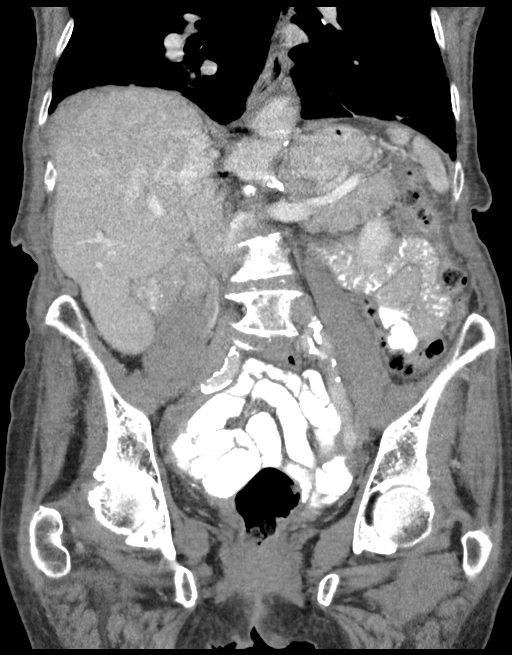

[16 of 46 positions shown; findings below may reference images not displayed]

FINDINGS: Lower chest: Centrilobular emphysema. Scattered pleuroparenchymal
scarring. Atherosclerotic calcification of the aorta and aortic
valve. Heart size normal. Small pericardial effusion, similar. No
pleural fluid.

Hepatobiliary: Liver is unremarkable. Cholecystectomy. No biliary
ductal dilatation.

Pancreas: Negative.

Spleen: Negative.

Adrenals/Urinary Tract: Adrenal glands are unremarkable. Minimal
scarring in the right kidney. Tiny stone in the right kidney.
Kidneys are otherwise unremarkable. Ureters are decompressed.
Bladder is grossly unremarkable.

Stomach/Bowel: Stomach and small bowel are unremarkable. Appendix is
not well-visualized. Fair amount of stool is seen in the colon.

Vascular/Lymphatic: Atherosclerotic calcification of the aorta.
Infrarenal aorta measures up to 3.2 cm as before. Ectatic proximal
right external iliac artery, 1.7 cm. No pathologically enlarged
lymph nodes.

Reproductive: Hysterectomy.  No adnexal mass.

Other: No free fluid. Mild omental haziness in the left lower
quadrant (2/36), nonspecific. No discrete nodularity.

Musculoskeletal: Degenerative changes in the spine and right hip.
Postoperative changes in the spine. L3 compression fracture is
unchanged.
IMPRESSION: 1. No acute findings to explain the patient's abdominal pain. Fair
amount of stool in the colon is indicative of constipation.
2. Small pericardial effusion, stable.
3. Tiny right renal stone.
4. Abdominal Aortic aneurysm NOS (J3E1C-LCB.P), stable. Recommend
followup by ultrasound in 3 years. This recommendation follows ACR
consensus guidelines: White Paper of the ACR Incidental Findings
Committee II on Vascular Findings. [HOSPITAL] 0305;
5. Aortic atherosclerosis (J3E1C-VL1.1).
6.  Emphysema (J3E1C-GMY.9).

## 2021-10-10 ENCOUNTER — Other Ambulatory Visit (HOSPITAL_COMMUNITY): Payer: Self-pay

## 2021-10-20 ENCOUNTER — Other Ambulatory Visit: Payer: Self-pay

## 2021-10-20 MED ORDER — TRELEGY ELLIPTA 100-62.5-25 MCG/ACT IN AEPB: INHALATION_SPRAY | RESPIRATORY_TRACT | 6 refills | Status: DC

## 2021-10-23 ENCOUNTER — Other Ambulatory Visit: Payer: Self-pay

## 2021-10-24 ENCOUNTER — Other Ambulatory Visit: Payer: Self-pay

## 2021-10-24 MED ORDER — TRELEGY ELLIPTA 100-62.5-25 MCG/ACT IN AEPB: 1.0000 | INHALATION_SPRAY | Freq: Every day | RESPIRATORY_TRACT | 3 refills | Status: AC

## 2021-10-26 IMAGING — MR MR HEAD WO/W CM
11 series · 48 of 48 positions shown · IV contrast (multihance)
Comparison: 09/24/2019

CLINICAL DATA: Metastatic lung cancer post resection and SRS

EXAM:
MRI HEAD WITHOUT AND WITH CONTRAST
TECHNIQUE: Multiplanar, multiecho pulse sequences of the brain and surrounding
structures were obtained without and with intravenous contrast.
CONTRAST:  10mL MULTIHANCE GADOBENATE DIMEGLUMINE 529 MG/ML IV SOLN

[Series 2: FLAIR · sagittal · 3.0mm · 0.75mm/px · 2 of 39 slices shown (1 of 2)]
[im 1/39]
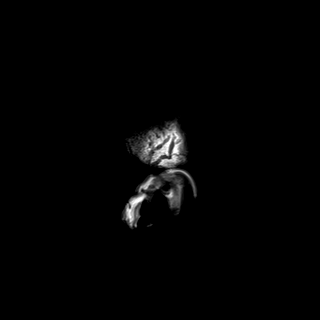
[im 39/39]
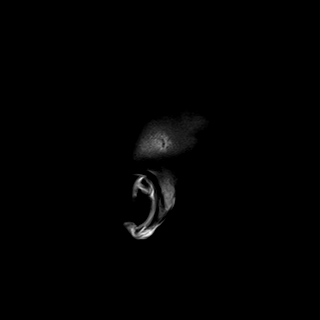

[Series 3: DWI · axial · 3.0mm · 1.50mm/px · z∈[-57,+87]mm · 5 of 78 slices shown (1 of 2)]
[im 1/78]
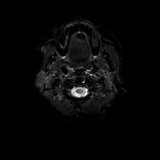
[im 20/78]
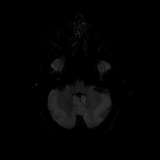
[im 39/78]
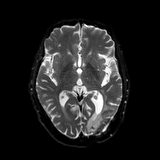
[im 58/78]
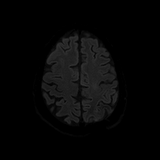
[im 78/78]
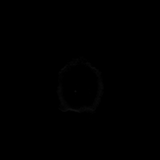

[Series 4: DWI · axial · 3.0mm · 1.50mm/px · z∈[-57,+87]mm · 2 of 38 slices shown (2 of 2)]
[im 1/38]
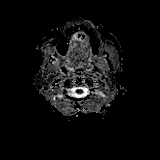
[im 38/38]
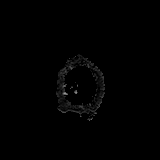

[Series 5: T2 · axial · 5.0mm · 0.57mm/px · z∈[-63,+90]mm · 2 of 27 slices shown]
[im 1/27]
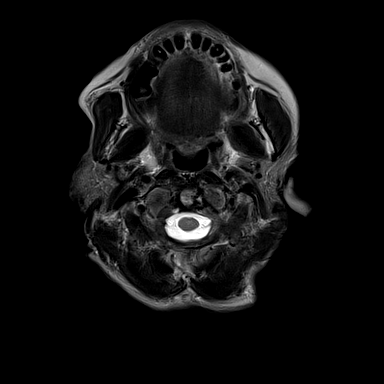
[im 27/27]
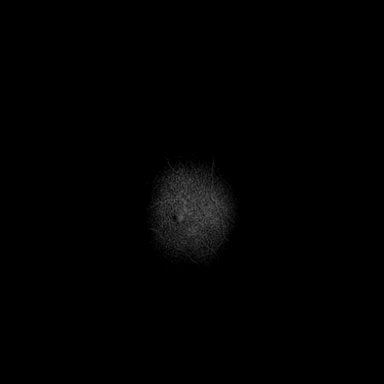

[Series 7: swi_images · axial · 1.5mm · 0.90mm/px · z∈[-54,+85]mm · 6 of 96 slices shown]
[im 1/96]
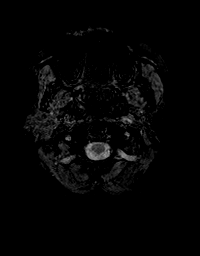
[im 20/96]
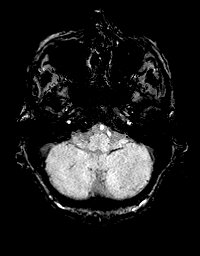
[im 39/96]
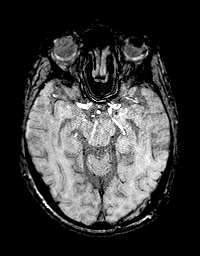
[im 58/96]
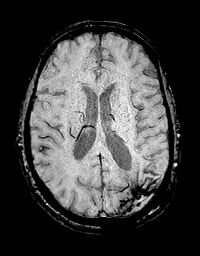
[im 77/96]
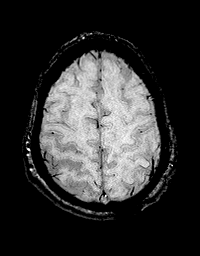
[im 96/96]
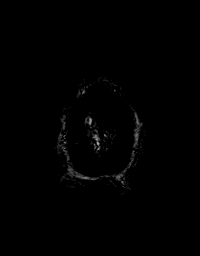

[Series 8: FLAIR · axial · 3.0mm · 0.57mm/px · z∈[-79,+74]mm · 3 of 52 slices shown (2 of 2)]
[im 1/52]
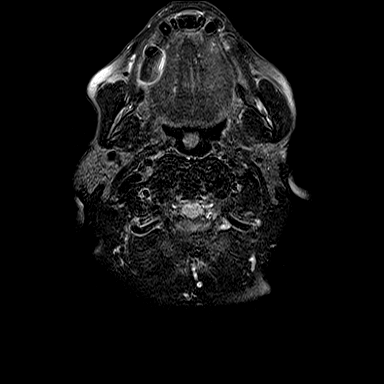
[im 26/52]
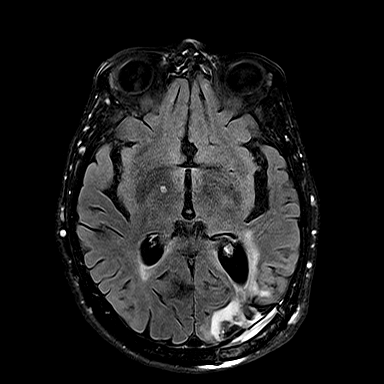
[im 52/52]
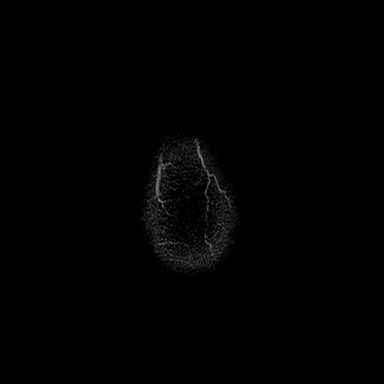

[Series 9: T1 · axial · 1.0mm · 0.75mm/px · z∈[-82,+77]mm · 10 of 160 slices shown]
[im 1/160]
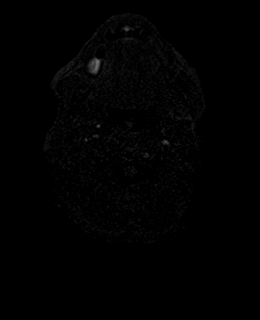
[im 18/160]
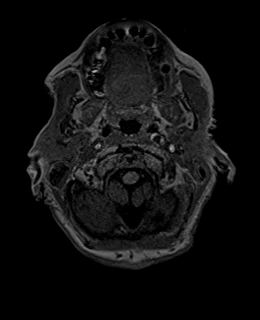
[im 36/160]
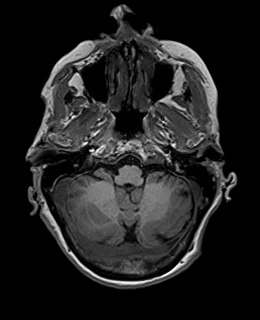
[im 54/160]
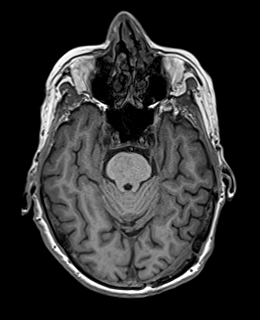
[im 71/160]
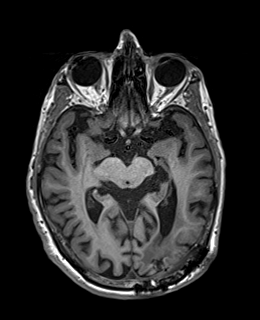
[im 89/160]
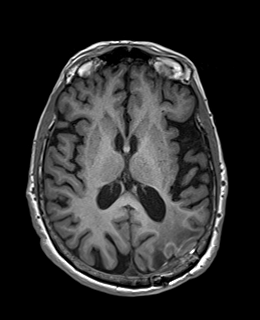
[im 107/160]
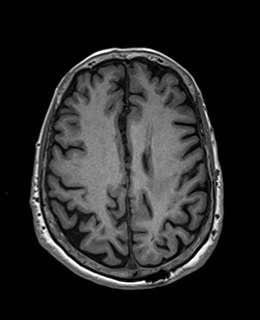
[im 124/160]
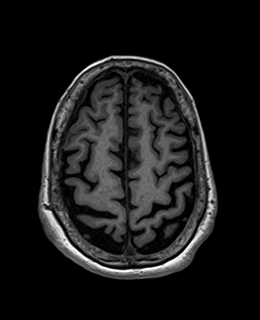
[im 142/160]
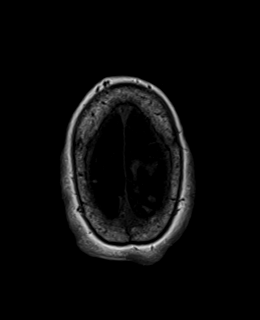
[im 160/160]
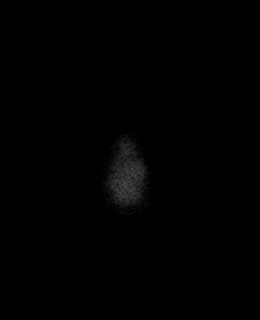

[Series 10: T2 post-contrast · coronal · 3.0mm · 0.57mm/px · 3 of 48 slices shown]
[im 1/48]
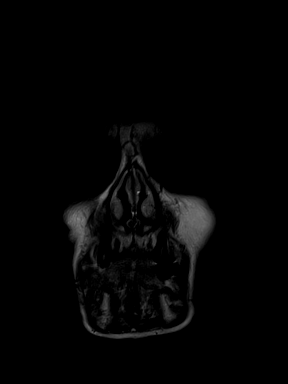
[im 24/48]
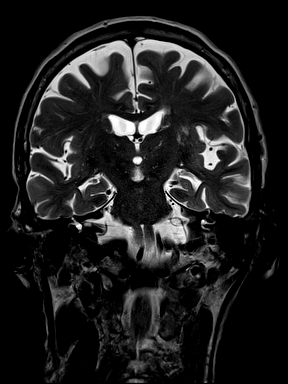
[im 48/48]
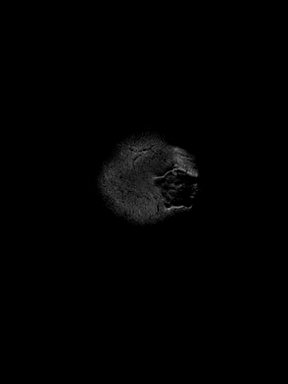

[Series 11: T1 post-contrast · axial · 1.0mm · 0.75mm/px · z∈[-82,+77]mm · 10 of 160 slices shown (1 of 2)]
[im 1/160]
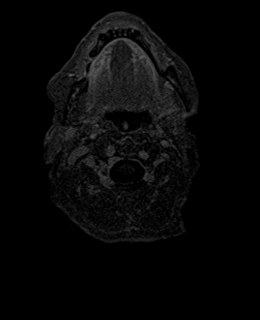
[im 18/160]
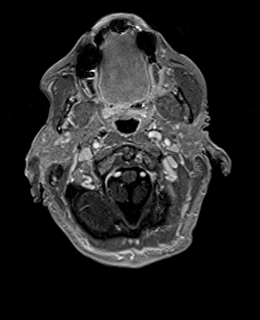
[im 36/160]
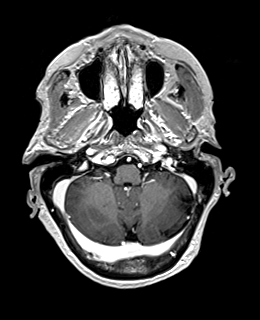
[im 54/160]
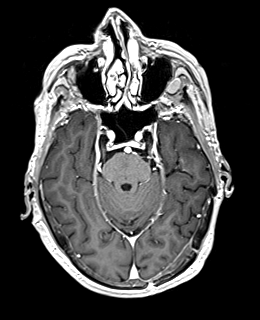
[im 71/160]
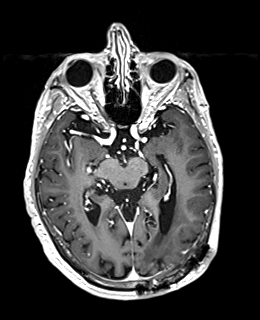
[im 89/160]
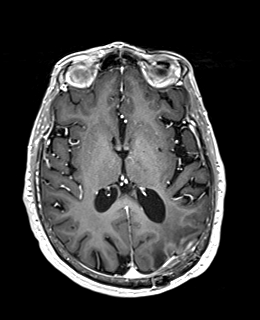
[im 107/160]
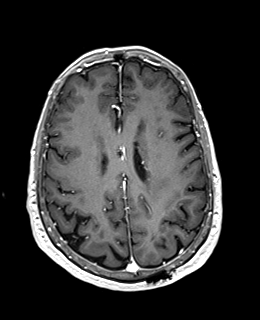
[im 124/160]
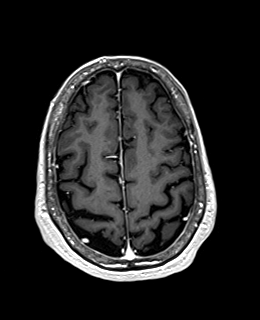
[im 142/160]
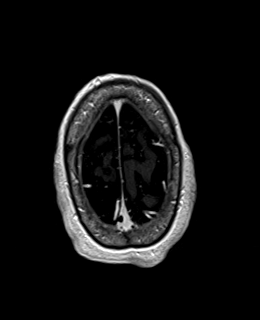
[im 160/160]
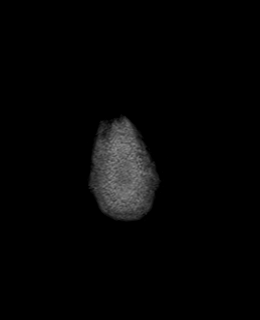

[Series 12: T1 post-contrast · coronal · 3.0mm · 0.57mm/px · 3 of 48 slices shown (2 of 2)]
[im 1/48]
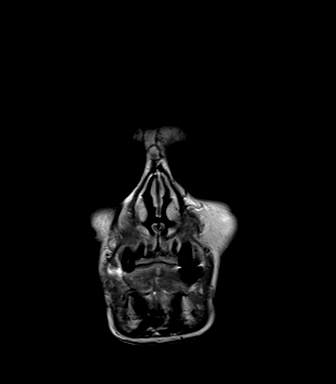
[im 24/48]
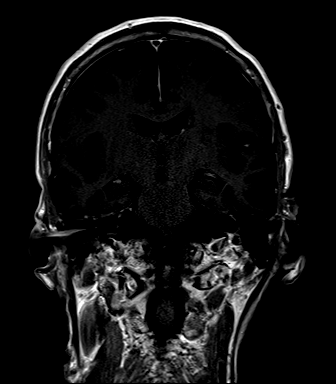
[im 48/48]
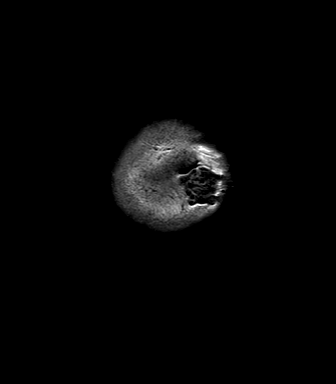

[Series 13: FLAIR post-contrast · sagittal · 3.0mm · 0.75mm/px · 2 of 39 slices shown]
[im 1/39]
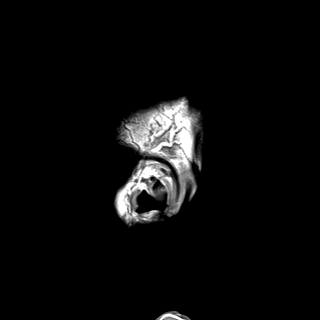
[im 39/39]
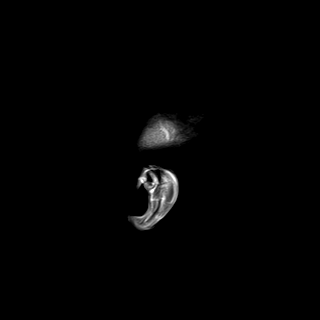

[48 of 48 positions shown; findings below may reference images not displayed]

FINDINGS: Brain: Hemosiderin lined resection cavity underlying left
parieto-occipital craniotomy again identified with further
contraction. Stable thin extra-axial collection. Increased
enhancement at the anterosuperior aspect of the cavity (series 12,
image 11). The extent of surrounding T2 FLAIR hyperintensity again
remains similar.

There is a 3 mm enhancing lesion along the internal capsule (series
11, image 80). This was punctate on the prior study.

There is a 5 mm enhancing left frontal lesion along the falx (series
12, image 42). This was also previously punctate dating back to
06/26/2019.

No hydrocephalus. Prominence of the ventricles and sulci reflects
stable parenchymal volume loss. Additional patchy T2 hyperintensity
in the supratentorial and pontine white matter is nonspecific but
may reflect chronic microvascular ischemic changes. There is no
acute infarction.

Vascular: Major vessel flow voids at the skull base are preserved.

Skull and upper cervical spine: Normal marrow signal is preserved.

Sinuses/Orbits: Paranasal sinuses are aerated. Orbits are
unremarkable.

Other: Sella is unremarkable.  Mastoid air cells are clear.
IMPRESSION: Increased enhancement at the anterosuperior aspect of the surgical
cavity. This is probably treatment related but attention on
follow-up is recommended.

3 mm suspected metastasis along the inferior right internal capsule
and 5 mm suspected left frontal metastasis along the falx.

## 2022-01-02 ENCOUNTER — Telehealth: Payer: Self-pay | Admitting: Pharmacist

## 2022-01-02 NOTE — Progress Notes (Signed)
? ? ?Chronic Care Management ?Pharmacy Assistant  ? ?Name: [name]  MRN: [id number] DOB: 10/04/1941 ? ? ?Reason for Encounter: Disease State - General Adherence Call  ?  ? ?Recent office visits:  ?None noted.  ? ?Recent consult visits:  ?None noted.  ? ?Hospital visits:  ?None in previous 6 months ? ?Medications: ?Outpatient Encounter Medications as of 01/02/2022  ?Medication Sig  ? albuterol (VENTOLIN HFA) 108 (90 Base) MCG/ACT inhaler INHALE 2 PUFFS EVERY 6 HOURS AS NEEDED FOR WHEEZING OR SHORTNESS OF BREATH.  ? ALPRAZolam (XANAX) 0.5 MG tablet TAKE (1) TABLET BY MOUTH TWICE A DAY AS NEEDED.  ? citalopram (CELEXA) 40 MG tablet TAKE (1/2) TABLET BY MOUTH AT BEDTIME.  ? denosumab (PROLIA) 60 MG/ML SOSY injection INJECT INTO UPPER ARM, THIGH, OR ABDOMEN ONCE.  ? dexamethasone (DECADRON) 4 MG tablet Take 4 mg by mouth daily.  ? diphenhydrAMINE (BENADRYL) 50 MG tablet Take 1 tablet (50 mg total) by mouth once for 1 dose. Take one hour prior to CT scan  ? famotidine (PEPCID) 20 MG tablet SMARTSIG:1 By Mouth Every 12 Hours  ? ferrous sulfate 325 (65 FE) MG tablet Take 325 mg by mouth daily with breakfast.  ? fluticasone (FLONASE) 50 MCG/ACT nasal spray Place 2 sprays into both nostrils daily.  ? Fluticasone-Umeclidin-Vilant (TRELEGY ELLIPTA) 100-62.5-25 MCG/ACT AEPB Inhale 1 puff into the lungs daily.  ? gabapentin (NEURONTIN) 100 MG capsule TAKE 1 CAPSULE BY MOUTH THREE TIMES A DAY.  ? levocetirizine (XYZAL) 5 MG tablet Take 1 tablet (5 mg total) by mouth daily.  ? levothyroxine (SYNTHROID) 50 MCG tablet TAKE 1 TABLET BEFORE BREAKFAST.  ? meclizine (ANTIVERT) 25 MG tablet Take 12.5 mg by mouth 3 (three) times daily as needed.  ? Multiple Vitamin (MULTIVITAMIN WITH MINERALS) TABS tablet Take 1 tablet by mouth daily.  ? ondansetron (ZOFRAN) 4 MG tablet TAKE 1 TABLET BY MOUTH 3 TIMES DAILY AS NEEDED FOR NAUSEA AND VOMITING.  ? osimertinib mesylate (TAGRISSO) 80 MG tablet TAKE 1 TABLET (80 MG TOTAL) BY MOUTH DAILY.  ?  OVER THE COUNTER MEDICATION Bone Support 2-4 tabs daily  ? OXcarbazepine (TRILEPTAL) 150 MG tablet TAKE (1) TABLET BY MOUTH TWICE DAILY.  ? pantoprazole (PROTONIX) 40 MG tablet TAKE (1) TABLET BY MOUTH ONCE DAILY BEFORE BREAKFAST.  ? Probiotic Product (ALIGN PO) Take by mouth daily.  ? ?No facility-administered encounter medications on file as of 01/02/2022.  ? ? ?Have you had any problems recently with your health? ?Patient reported she has been dizzy and plans to contact Dr Antony Contras office to see if she may have a virtual visit while she is looking for a PCP locally there. She feels it is lingered since getting over [place].  ? ?Have you had any problems with your pharmacy? ?Patient reported she is currently in [place], [place] and wil be living there permanently now. She has had some of her medications changed. She is not having any problems with her current pharmacy.  ? ?What issues or side effects are you having with your medications? ?Patient denied any issues or side effects with her current medications.  ? ?What would you like me to pass along to [name], CPP for them to help you with?  ?Patient did not have anything to pass along to CPP at this time other than thank you for all your help while she was at [facility]. Her breathing has improved and [place] 97[place]  ? ?What  can we do to take care of you better? ?Patient did not have any recommendations at this time and was very appreciative of the assistance she received.  ? ?Care Gaps ? ?AWV: overdue ?Colonoscopy: done 11/23/14 ?DM Eye Exam: N/A ?DM Foot Exam: N/A ?Microalbumin: N/A ?HbgAIC: done 03/08/19 (5.1) ?DEXA: ordered  ?Mammogram: ordered ? ? ?Star Rating Drugs: ?No Star Rating Drugs Noted.  ? ? ?Future Appointments  ?Date Time Provider [place]  ?04/12/2022  2:15 PM [facility] PHARMACIST [facility]  ? ? ? ?Liza Showfety, CCMA ?Clinical Pharmacist Assistant  ?[phone] ? ?

## 2022-01-11 IMAGING — CT CT CHEST W/ CM
2 of 3 series · 15 of 36 positions shown, 18 images · IV contrast (Omnipaque or Isovue)
Comparison: 05/26/2019

CLINICAL DATA: Left lung adenocarcinoma. Status post surgery and
radiation therapy.

EXAM:
CT CHEST WITH CONTRAST
TECHNIQUE: Multidetector CT imaging of the chest was performed during
intravenous contrast administration.
CONTRAST:  75mL OMNIPAQUE IOHEXOL 300 MG/ML  SOLN

[Series 2: routine chest with · axial · 0.72mm/px · z∈[+1134,+1386]mm · 12 of 150 slices shown, 15 images]
[im 12/150  mediastinal]
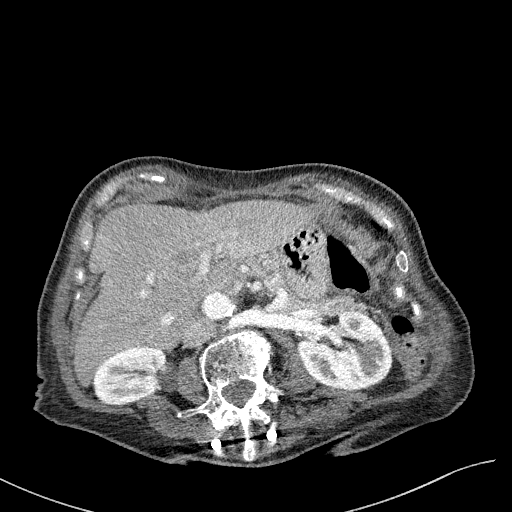
[im 12/150  lung]
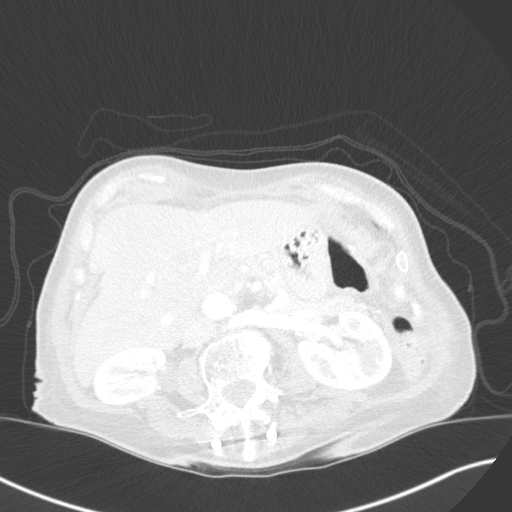
[im 23/150  lung]
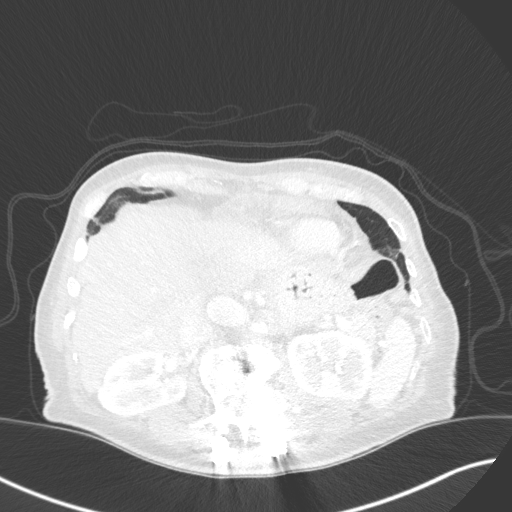
[im 34/150  lung]
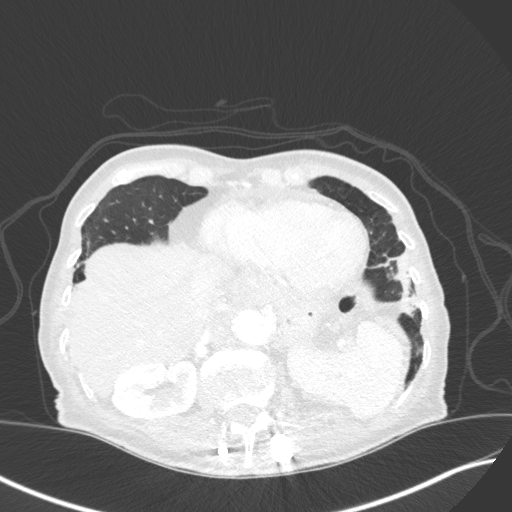
[im 45/150  lung]
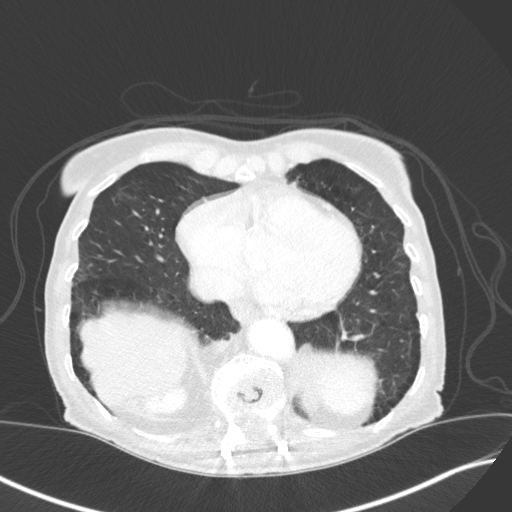
[im 56/150  mediastinal]
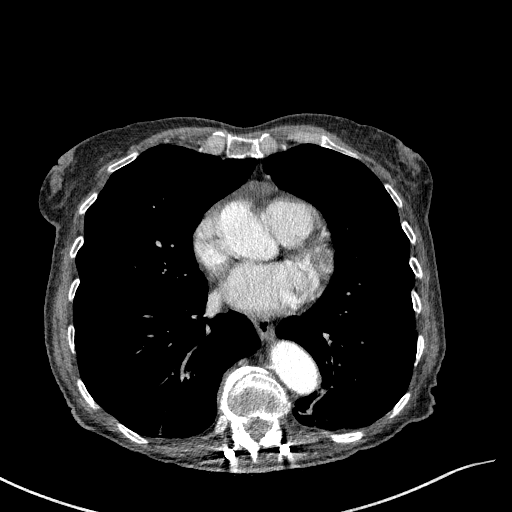
[im 56/150  lung]
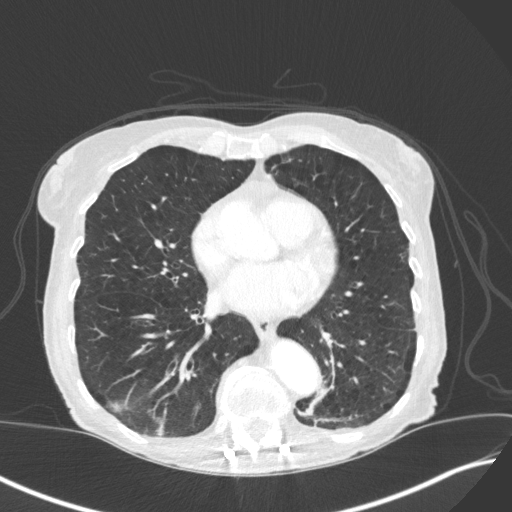
[im 67/150  lung]
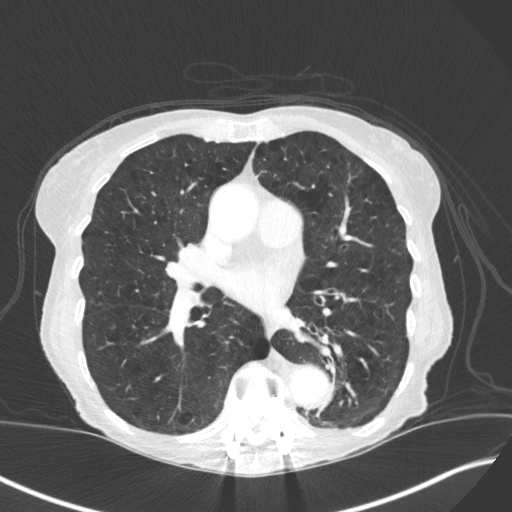
[im 83/150  lung]
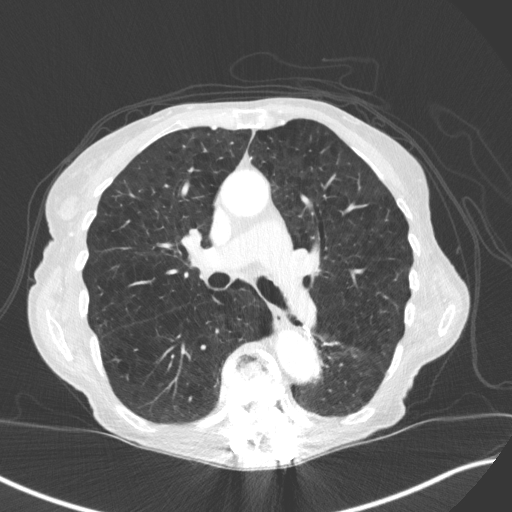
[im 94/150  lung]
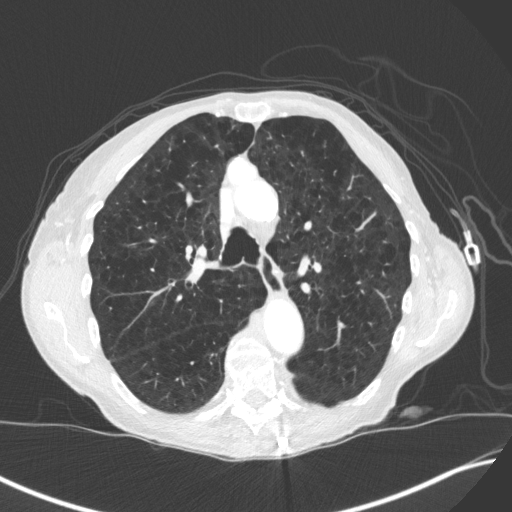
[im 105/150  mediastinal]
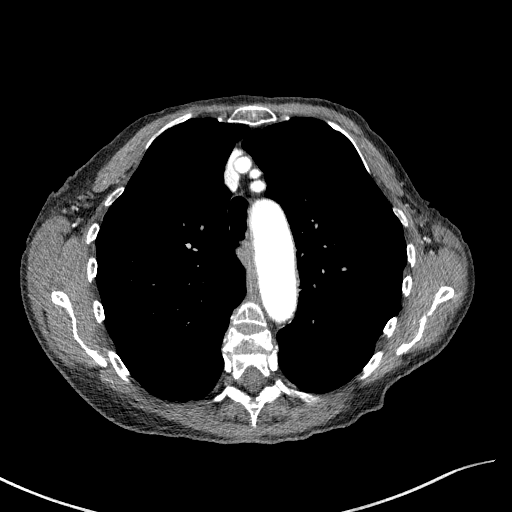
[im 105/150  lung]
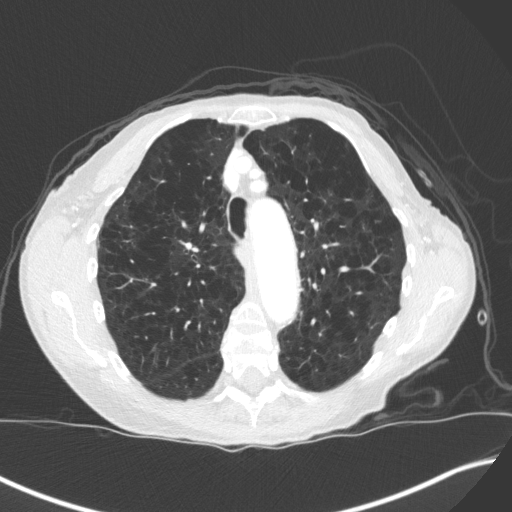
[im 116/150  lung]
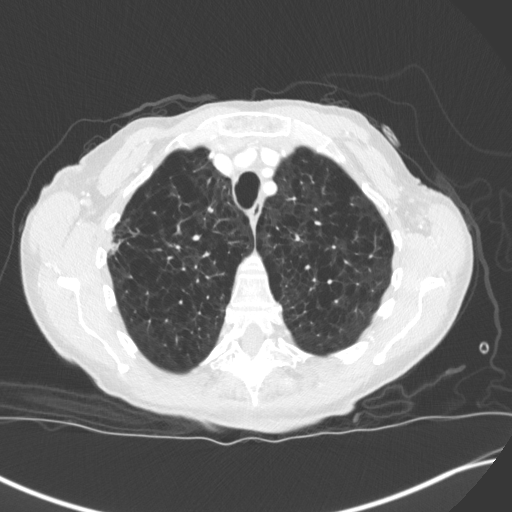
[im 127/150  lung]
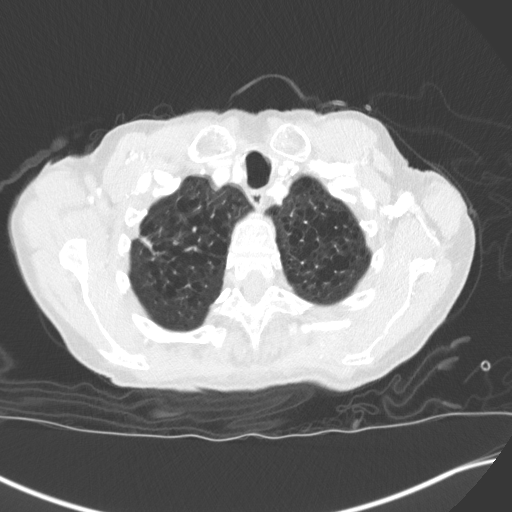
[im 138/150  lung]
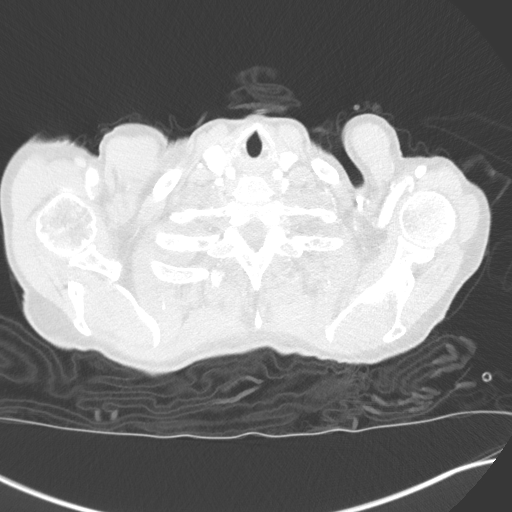

[Series 5: coronal · coronal · 0.60mm/px · 3 of 153 slices shown]
[im 31/153  lung]
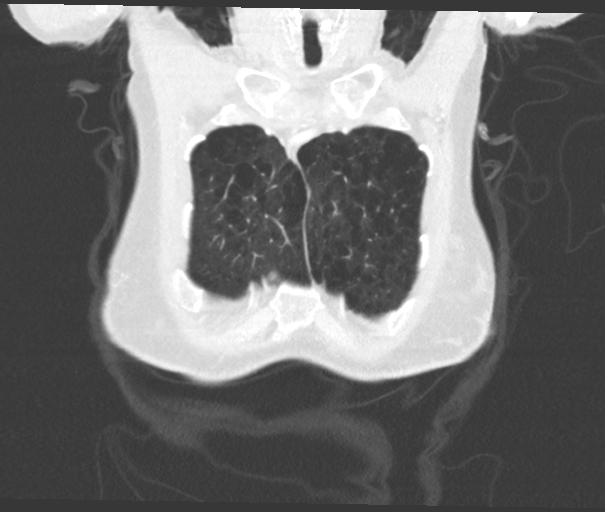
[im 61/153  lung]
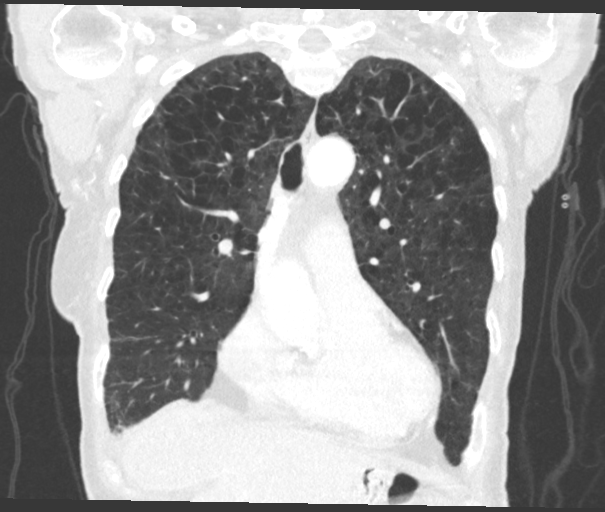
[im 92/153  lung]
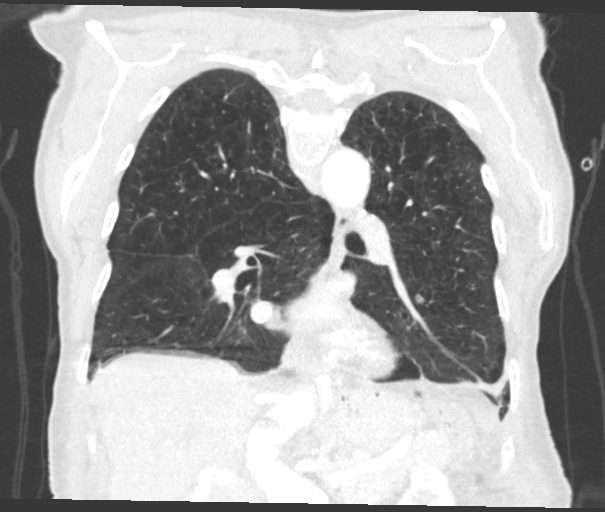

[15 of 36 positions shown; findings below may reference images not displayed]

FINDINGS: Cardiovascular: The heart size is normal. No substantial pericardial
effusion. Coronary artery calcification is evident. Atherosclerotic
calcification is noted in the wall of the thoracic aorta.

Mediastinum/Nodes: No mediastinal lymphadenopathy. There is no hilar
lymphadenopathy. The esophagus has normal imaging features. There is
no axillary lymphadenopathy.

Lungs/Pleura: Centrilobular and paraseptal emphysema evident. New
atelectasis or scarring noted in the lateral right upper lobe
([DATE]). Collapse/consolidative changes in the dependent lower lobes
has decreased in the interval, but remains stable in the lingula 7
mm left infrahilar nodule on 88/4 was 4 mm previously and measured
about 5 mm on the intervening abdomen/pelvis CT of 12/07/2019. [DATE] mm
medial left lower lobe nodule on 93/4 was about 2 mm previously.
Small airway impaction noted in the lung bases bilaterally. No
pleural effusion.

Upper Abdomen: Unremarkable.

Musculoskeletal: Thoracolumbar fusion hardware. Bones are diffusely
demineralized multilevel thoracolumbar compression fractures again
noted with new superior endplate compression deformity at T7 and
progression of collapse at T9. Superior endplate compression
deformity at T11 is progressive.
IMPRESSION: 1. Interval progression of left lower lobe pulmonary nodules now
measuring up to 7 mm. Metastatic disease a concern. Nodule size
below accepted threshold for reliable resolution on PET imaging.
Consider follow-up CT chest in 3 months to re-evaluate.
2. Collapse/consolidative changes in the dependent lower lobes
bilaterally has decreased in the interval, but remains stable in the
lingula.
3. New atelectasis or scarring in the lateral right upper lobe.
Attention on follow-up recommended.
4. Interval progression of compression fractures at T7, T9, and
T11.
5. Aortic Atherosclerosis (H050Z-Y9K.K) and Emphysema (H050Z-BOO.Y).

## 2022-01-19 ENCOUNTER — Encounter: Payer: Self-pay | Admitting: Family Medicine

## 2022-01-19 ENCOUNTER — Encounter: Payer: Self-pay | Admitting: Hematology and Oncology

## 2022-01-26 IMAGING — DX DG CHEST 2V
2 series · 2 of 2 positions shown · non-contrast
Comparison: May 25, 2019.

CLINICAL DATA: Cough.

EXAM:
CHEST - 2 VIEW

[chest lat]
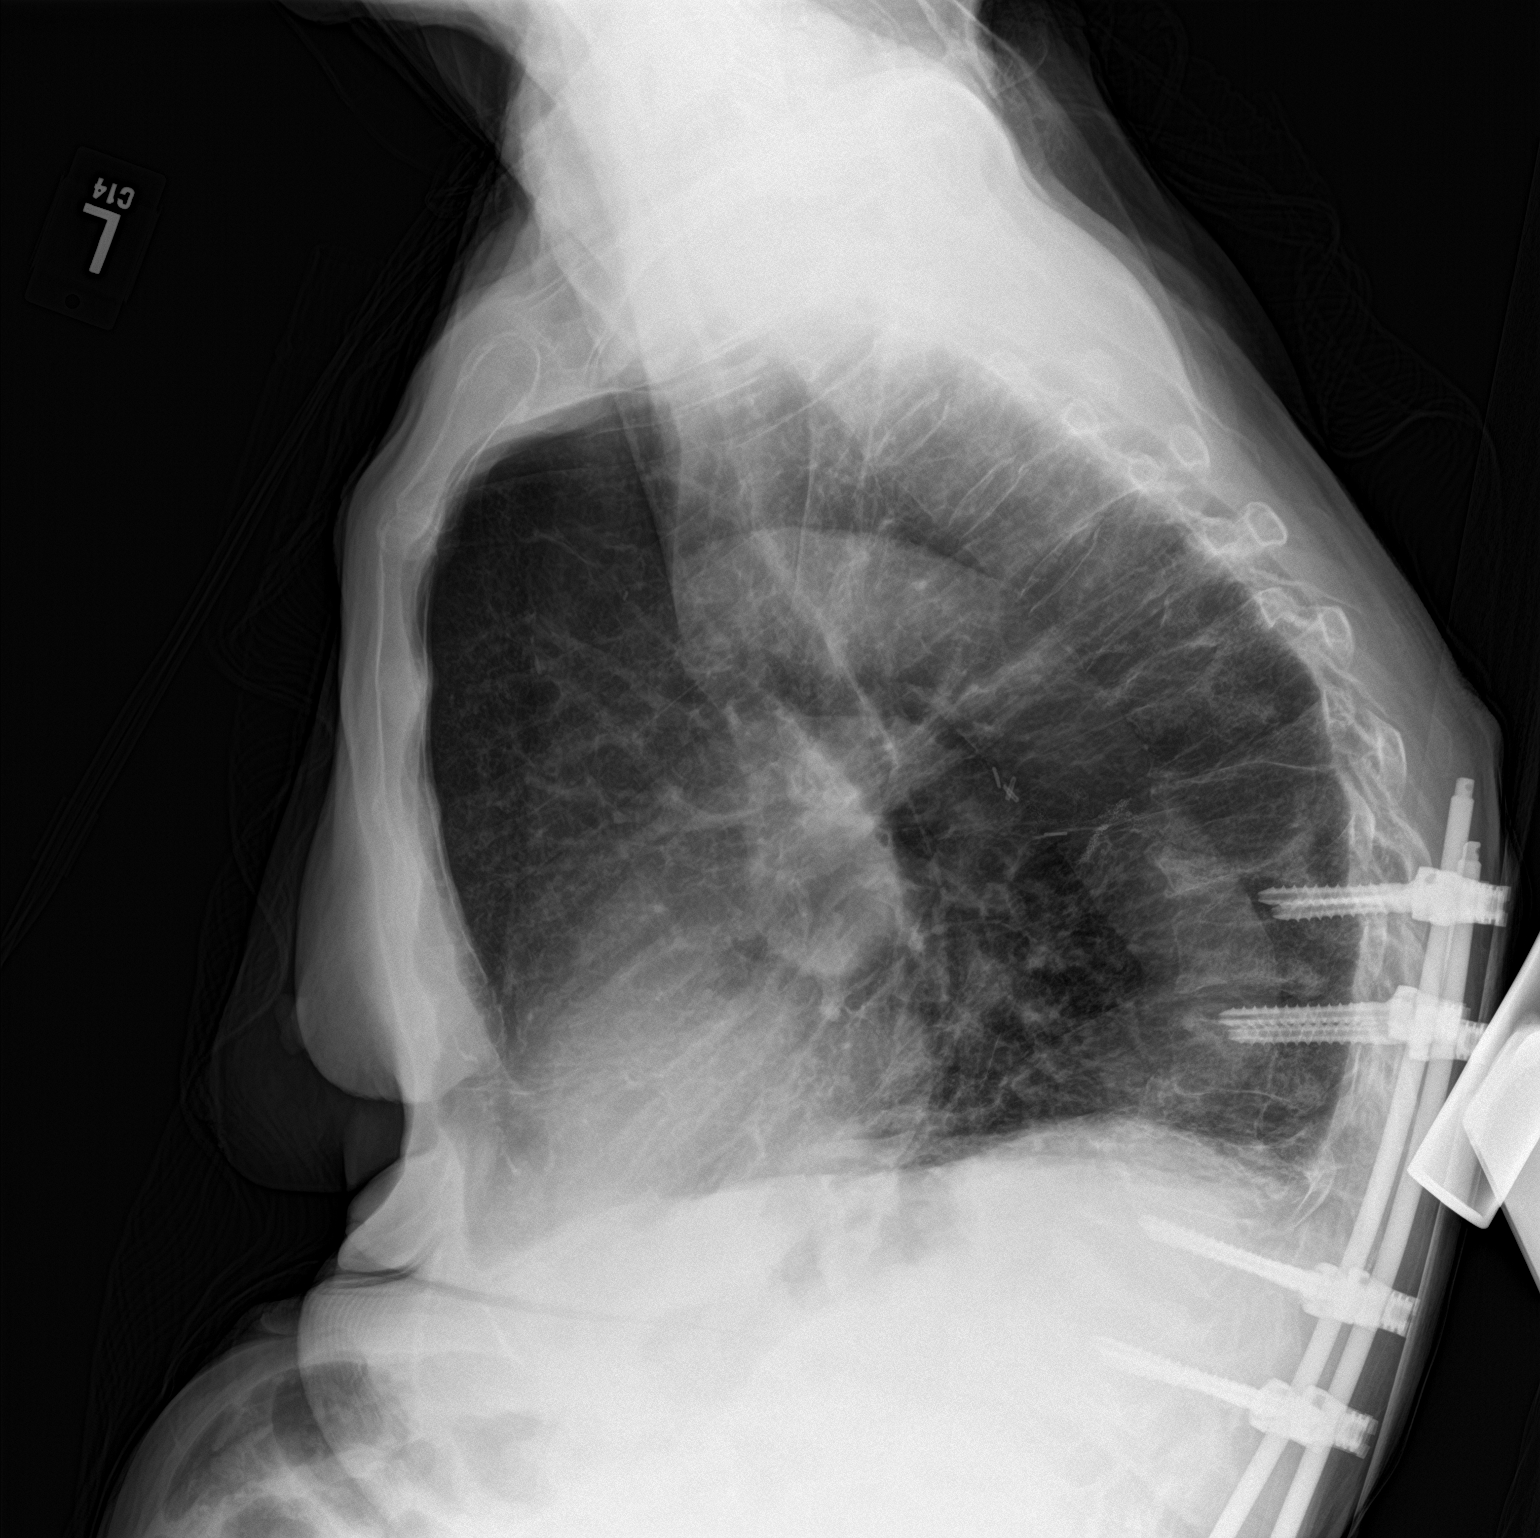

[chest ap]
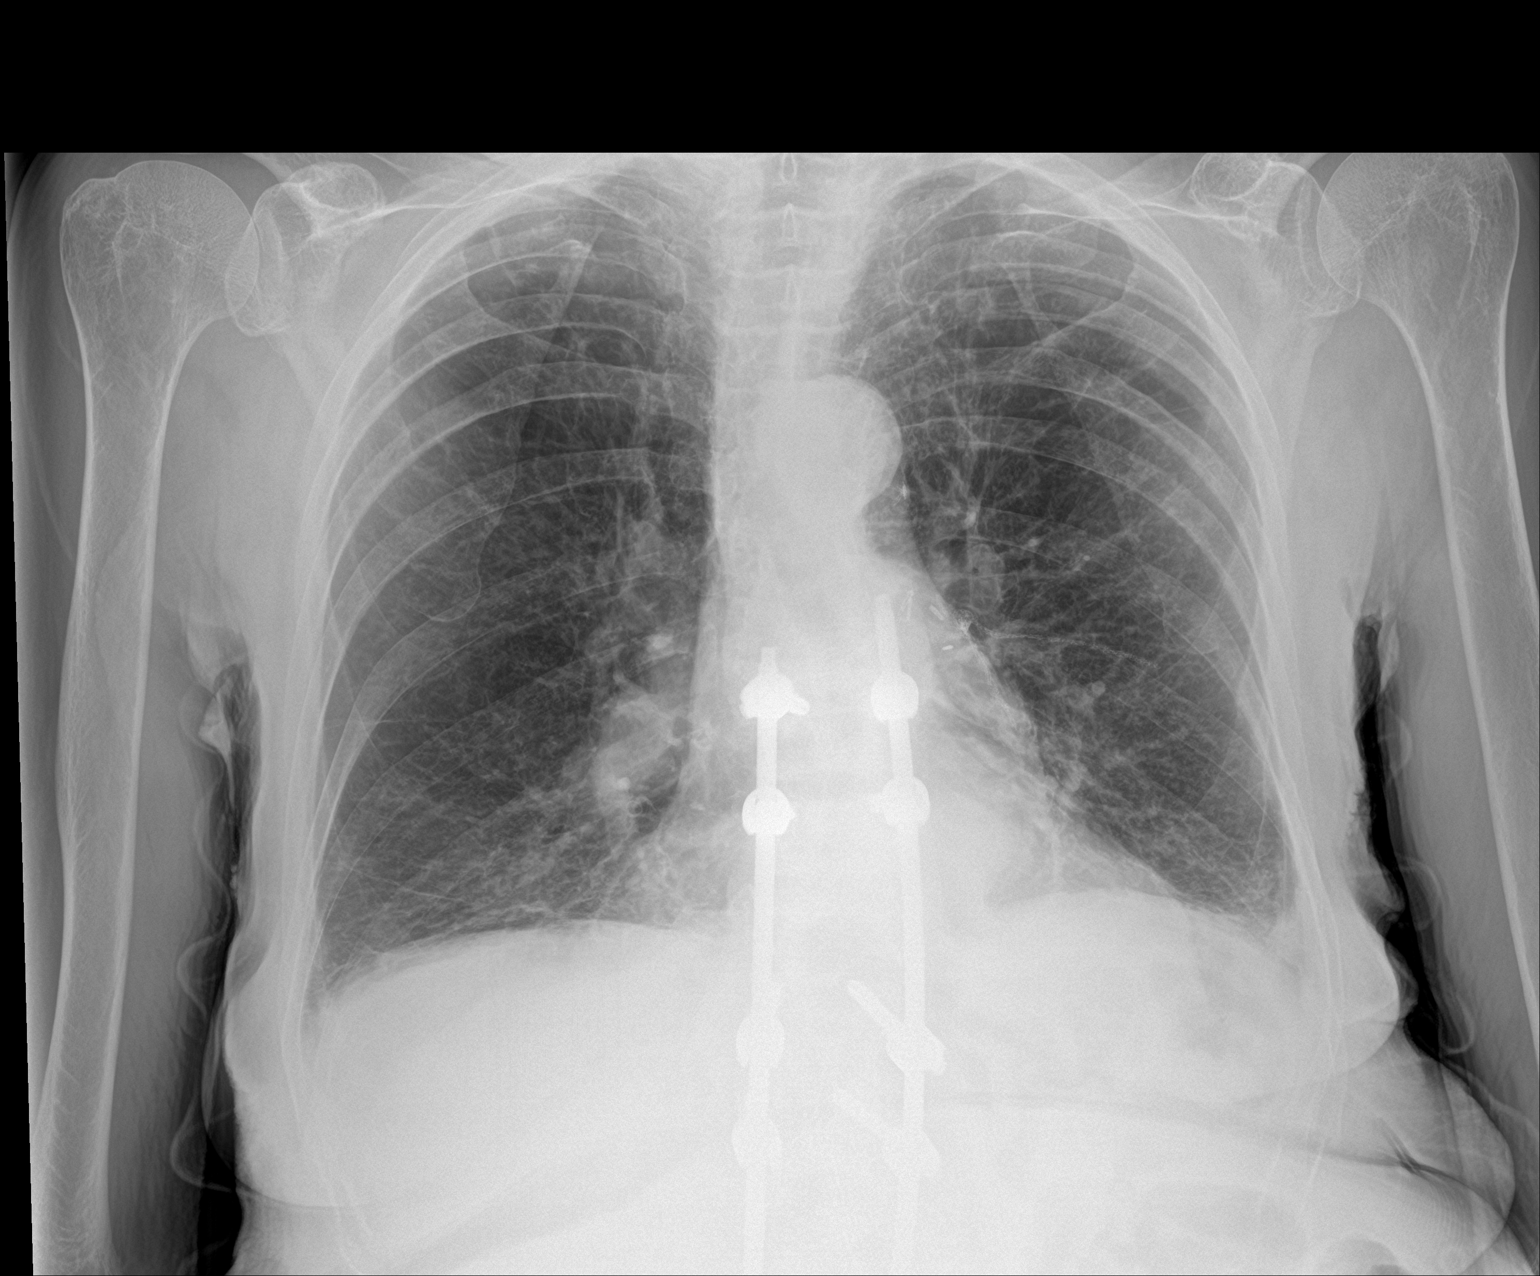

[2 of 2 positions shown; findings below may reference images not displayed]

FINDINGS: The heart size and mediastinal contours are within normal limits. No
pneumothorax or pleural effusion is noted. Mild bibasilar
subsegmental atelectasis or scarring is noted. Status post surgical
posterior fusion of lower thoracic and lumbar spine.
IMPRESSION: Mild bibasilar subsegmental atelectasis or scarring.

## 2022-01-31 IMAGING — DX DG CHEST 1V PORT
1 series · 1 of 1 positions shown · non-contrast
Comparison: 03/25/2020

CLINICAL DATA: Cough

EXAM:
PORTABLE CHEST 1 VIEW

[chest ap]
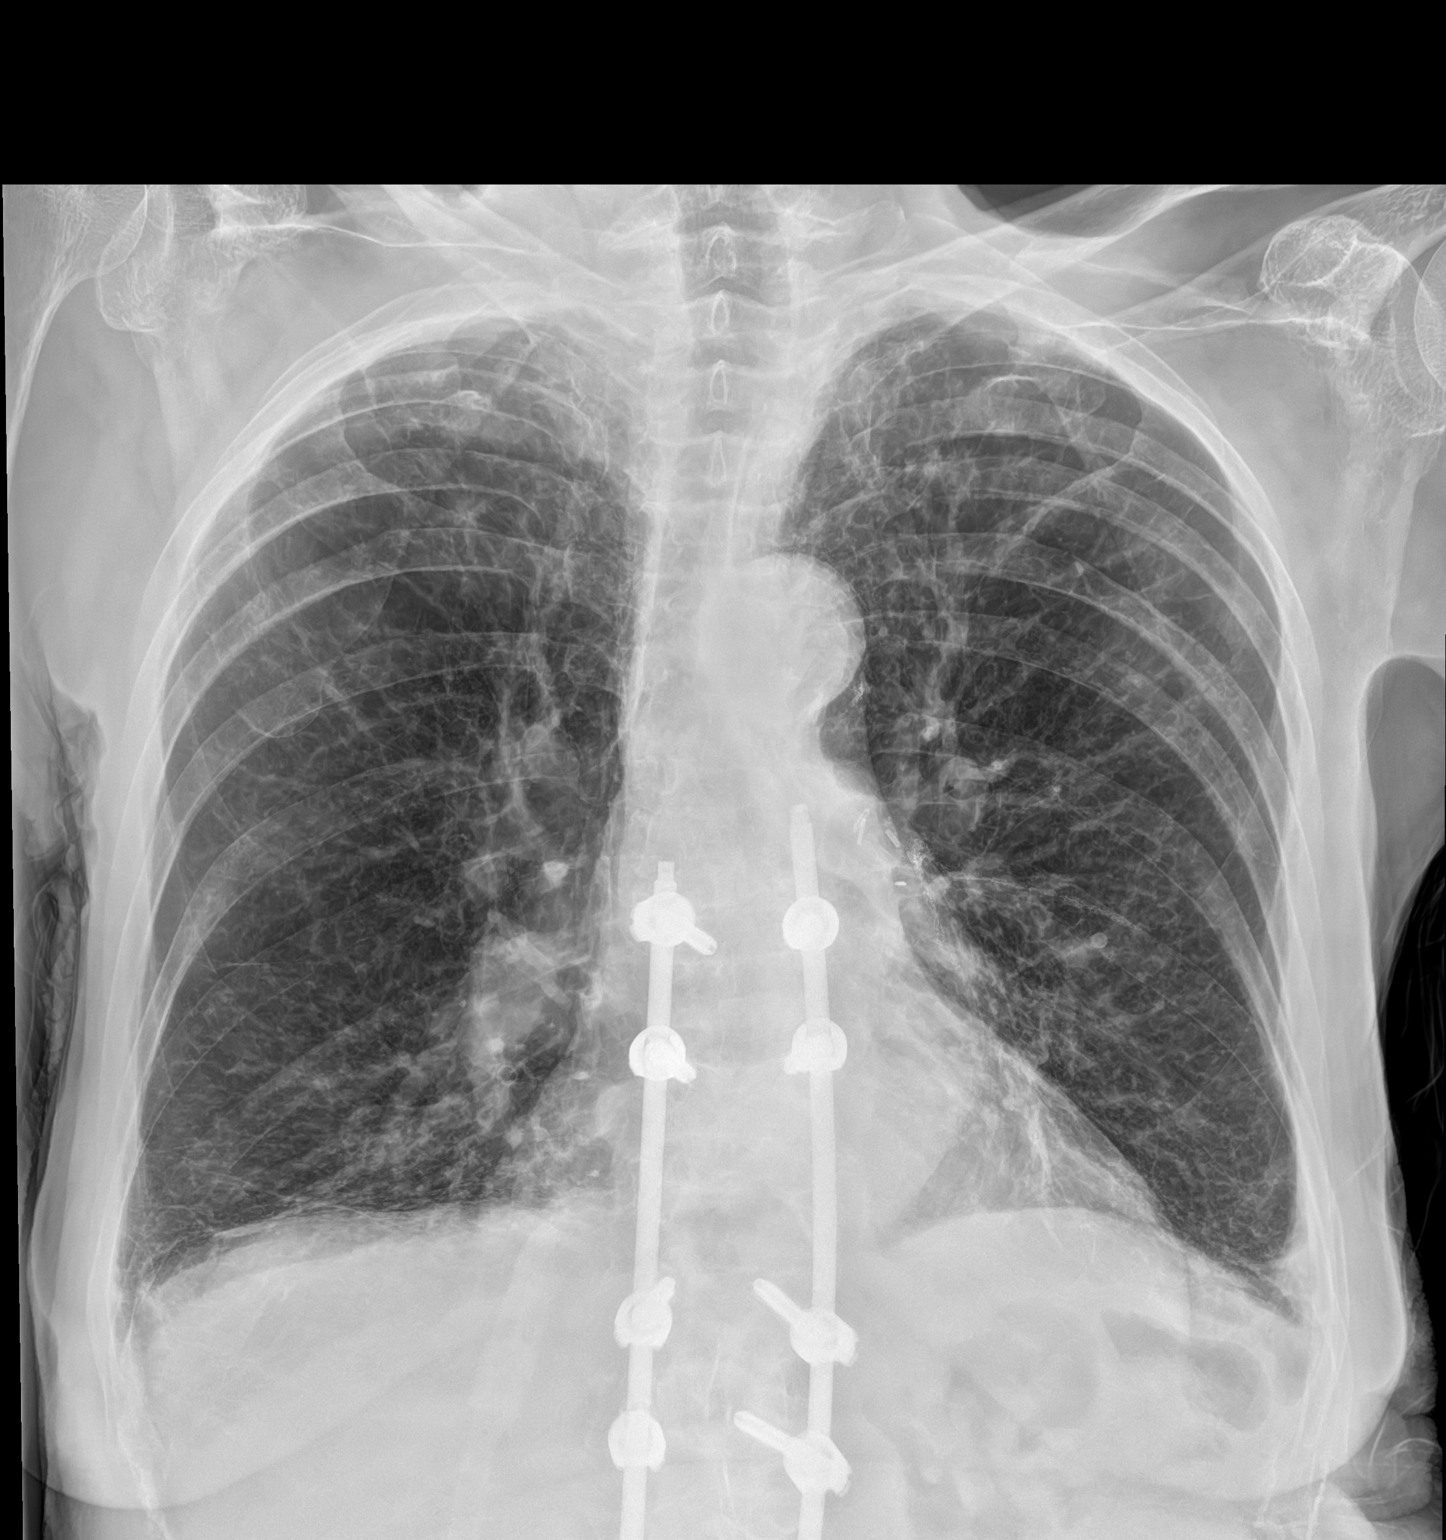

[1 of 1 positions shown; findings below may reference images not displayed]

FINDINGS: Emphysema with chronic interstitial prominence. Stable
atelectasis/scarring at the lung bases. No significant pleural
effusion. Normal heart size. Partially imaged thoracolumbar fusion.
IMPRESSION: No acute process in the chest.

## 2022-02-01 ENCOUNTER — Telehealth: Payer: Self-pay

## 2022-02-01 NOTE — Telephone Encounter (Signed)
Enter error,  ?

## 2022-02-19 ENCOUNTER — Other Ambulatory Visit: Payer: Self-pay | Admitting: Family Medicine

## 2022-02-20 NOTE — Telephone Encounter (Signed)
Requested medication (s) are due for refill today - yes ? ?Requested medication (s) are on the active medication list -yes ? ?Future visit scheduled -no ? ?Last refill: All: 09/13/21- 6 month supply ? ?Notes to clinic: Attempted to call patient to schedule appointment- left message to call office. Request sent for review- patient has been informed by office to make appointment and fails protocol. ? ?Requested Prescriptions  ?Pending Prescriptions Disp Refills  ? levocetirizine (XYZAL) 5 MG tablet [Pharmacy Med Name: Levocetirizine 86m Tablet] 30 tablet 11  ?  Sig: Take 1 tablet by mouth every day  ?  ? Ear, Nose, and Throat:  Antihistamines - levocetirizine dihydrochloride Failed - 02/19/2022 10:39 AM  ?  ?  Failed - Valid encounter within last 12 months  ?  Recent Outpatient Visits   ? ?      ? 5 months ago Right ear pain  ? [place] A, NP  ? 6 months ago COPD exacerbation ([facility]  ? BThomsonMEulogio Bear NP  ? 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 [facility], WCammie Mcgee MD  ? 1 year ago Right hip pain  ? [facility], WCammie Mcgee MD  ? 1 year ago Rhinosinusitis  ? [place] Crystal A, FNP  ? ?  ?  ? ? ?  ?  ?  Passed - Cr in normal range and within 360 days  ?  Creatinine  ?Date Value Ref Range Status  ?07/12/2021 0.61 0.44 - 1.00 mg/dL Final  ? ?Creat  ?Date Value Ref Range Status  ?01/26/2020 0.83 0.60 - 0.93 mg/dL Final  ?  Comment:  ?  For patients >46years of age, the reference limit ?for Creatinine is approximately 13% higher for people ?identified as African-American. ?. ?  ?   ?  ?  Passed - eGFR is 10 or above and within 360 days  ?  GFR, Est African American  ?Date Value Ref Range Status  ?01/26/2020 78 > OR = 60 mL/min/1.758mFinal  ? ?GFR calc Af Amer  ?Date Value Ref Range Status  ?06/20/2020 >60 >60 mL/min Final  ? ?GFR, Est Non African American  ?Date Value Ref Range  Status  ?01/26/2020 68 > OR = 60 mL/min/1.7327minal  ? ?GFR, Estimated  ?Date Value Ref Range Status  ?07/12/2021 >60 >60 mL/min Final  ?  Comment:  ?  (NOTE) ?Calculated using the CKD-EPI Creatinine Equation (2021) ?  ? ?GFR  ?Date Value Ref Range Status  ?02/16/2016 107.85 >60.00 mL/min Final  ?   ?  ?  ? levothyroxine (SYNTHROID) 50 MCG tablet [Pharmacy Med Name: Levothyroxine Sodium 71m73mablet] 30 tablet 11  ?  Sig: Take 1 tablet by mouth before breakfast  ?  ? Endocrinology:  Hypothyroid Agents Failed - 02/19/2022 10:39 AM  ?  ?  Failed - Valid encounter within last 12 months  ?  Recent Outpatient Visits   ? ?      ? 5 months ago Right ear pain  ? [place], NP  ? 6 months ago [facility] exacerbation ([facility] Marin[facility]ulogio Bear  ? 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 [facility] [place], WarrCammie Mcgee  ? 1 year ago Right hip pain  ? [facility] [place], WarrCammie Mcgee  ? 1 year  ago Rhinosinusitis  ? [place], Crystal A, FNP  ? ?  ?  ? ? ?  ?  ?  Passed - TSH in normal range and within 360 days  ?  TSH  ?Date Value Ref Range Status  ?04/17/2021 2.057 0.308 - 3.960 uIU/mL Final  ?  Comment:  ?  Performed at [facility], [address], [place], [place] [address]  ?01/26/2020 4.09 0.40 - 4.50 mIU/L Final  ?   ?  ?  ? gabapentin (NEURONTIN) 100 MG capsule [Pharmacy Med Name: Gabapentin 17m Capsule] 90 capsule 11  ?  Sig: Take 1 capsule by mouth 3 times a day  ?  ? Neurology: Anticonvulsants - gabapentin Failed - 02/19/2022 10:39 AM  ?  ?  Failed - Valid encounter within last 12 months  ?  Recent Outpatient Visits   ? ?      ? 5 months ago Right ear pain  ? [place] Jessica A, NP  ? 6 months ago COPD exacerbation ([facility]  ? BAlgonquinMEulogio Bear NP  ? 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 [facility]  [place], WCammie Mcgee MD  ? 1 year ago Right hip pain  ? [facility] WCammie Mcgee MD  ? 1 year ago Rhinosinusitis  ? BCary Crystal A, FNP  ? ?  ?  ? ? ?  ?  ?  Passed - Cr in normal range and within 360 days  ?  Creatinine  ?Date Value Ref Range Status  ?07/12/2021 0.61 0.44 - 1.00 mg/dL Final  ? ?Creat  ?Date Value Ref Range Status  ?01/26/2020 0.83 0.60 - 0.93 mg/dL Final  ?  Comment:  ?  For patients >428years of age, the reference limit ?for Creatinine is approximately 13% higher for people ?identified as African-American. ?. ?  ?   ?  ?  Passed - Completed PHQ-2 or PHQ-9 in the last 360 days  ?  ?  ? OXcarbazepine (TRILEPTAL) 150 MG tablet [Pharmacy Med Name: Oxcarbazepine 1512mTablet] 60 tablet 11  ?  Sig: Take 1 tablet by mouth twice daily  ?  ? Neurology: Anticonvulsants - oxcarbazepine Failed - 02/19/2022 10:39 AM  ?  ?  Failed - Na in normal range and within 360 days  ?  Sodium  ?Date Value Ref Range Status  ?07/12/2021 126 (L) 135 - 145 mmol/L Final  ?   ?  ?  Failed - HCT in normal range and within 360 days  ?  HCT  ?Date Value Ref Range Status  ?07/12/2021 32.6 (L) 36.0 - 46.0 % Final  ?   ?  ?  Failed - HGB in normal range and within 360 days  ?  Hemoglobin  ?Date Value Ref Range Status  ?07/12/2021 10.9 (L) 12.0 - 15.0 g/dL Final  ?   ?  ?  Failed - Valid encounter within last 12 months  ?  Recent Outpatient Visits   ? ?      ? 5 months ago Right ear pain  ? Br[place]Jessica A, NP  ? 6 months ago [facility] exacerb[facility] ([facility] ? BrAthertonaEulogio BearNP  ? 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 [facility] [place], WaCammie McgeeMD  ? 1 year ago Right hip pain  ? [facility] Pick[facility], Cammie Mcgee, MD  ? 1 year ago Rhinosinusitis  ? [place], Crystal A, FNP  ? ?  ?  ? ? ?  ?  ?  Passed - WBC in normal range and within 360 days  ?  WBC  ?Date Value Ref Range  Status  ?10/19/2020 7.9 4.0 - 10.5 K/uL Final  ? ?WBC Count  ?Date Value Ref Range Status  ?07/12/2021 4.3 4.0 - 10.5 K/uL Final  ?   ?  ?  Passed - PLT in normal range and within 360 days  ?  Platelet Count  ?Date Value Ref Range Status  ?07/12/2021 222 150 - 400 K/uL Final  ?   ?  ?  Passed - Cr in normal range and within 360 days  ?  Creatinine  ?Date Value Ref Range Status  ?07/12/2021 0.61 0.44 - 1.00 mg/dL Final  ? ?Creat  ?Date Value Ref Range Status  ?01/26/2020 0.83 0.60 - 0.93 mg/dL Final  ?  Comment:  ?  For patients >75 years of age, the reference limit ?for Creatinine is approximately 13% higher for people ?identified as African-American. ?. ?  ?   ?  ?  Passed - Completed PHQ-2 or PHQ-9 in the last 360 days  ?  ?  ? ? ? ?Requested Prescriptions  ?Pending Prescriptions Disp Refills  ? levocetirizine (XYZAL) 5 MG tablet [Pharmacy Med Name: Levocetirizine 34m Tablet] 30 tablet 11  ?  Sig: Take 1 tablet by mouth every day  ?  ? Ear, Nose, and Throat:  Antihistamines - levocetirizine dihydrochloride Failed - 02/19/2022 10:39 AM  ?  ?  Failed - Valid encounter within last 12 months  ?  Recent Outpatient Visits   ? ?      ? 5 months ago Right ear pain  ? [place] Jessica A, NP  ? 6 months ago COPD exacerbation ([facility]  ? BSalleyMEulogio Bear NP  ? 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 [facility], WCammie Mcgee MD  ? 1 year ago Right hip pain  ? [facility], WCammie Mcgee MD  ? 1 year ago Rhinosinusitis  ? B[place] Crystal A, FNP  ? ?  ?  ? ? ?  ?  ?  Passed - Cr in normal range and within 360 days  ?  Creatinine  ?Date Value Ref Range Status  ?07/12/2021 0.61 0.44 - 1.00 mg/dL Final  ? ?Creat  ?Date Value Ref Range Status  ?01/26/2020 0.83 0.60 - 0.93 mg/dL Final  ?  Comment:  ?  For patients >453years of age, the reference limit ?for Creatinine is approximately 13% higher for  people ?identified as African-American. ?. ?  ?   ?  ?  Passed - eGFR is 10 or above and within 360 days  ?  GFR, Est African American  ?Date Value Ref Range Status  ?01/26/2020 78 > OR = 60 mL/min/1.777mFinal  ? ?GF

## 2022-02-20 NOTE — Telephone Encounter (Signed)
Spoke to pt. Per pt she did reci' the message, however, she is out of town until June. Per pt she don't need any refills from our office as of right because she is get her meds from a local dr where she is at.  ? ?Per pt, will resume everything when she is back in town.  ? ? ?

## 2022-03-01 ENCOUNTER — Telehealth: Payer: Self-pay | Admitting: *Deleted

## 2022-03-01 NOTE — Telephone Encounter (Signed)
Received vm message from patient. She states she has returned to the [place] longer living with her granddaughter in [place], [place]  She is asking for an appt to see Dr. [name]  Attempted call back-no answer but was able to leave vm message for pt to call back at her convenience.  Scheduling message sent.

## 2022-03-02 ENCOUNTER — Telehealth: Payer: Self-pay | Admitting: Hematology and Oncology

## 2022-03-02 NOTE — Telephone Encounter (Signed)
.  Called patient to schedule appointment per 5/25 inbasket, left pt msg

## 2022-03-03 IMAGING — MR MR HEAD WO/W CM
11 series · 48 of 48 positions shown · IV contrast (multihance)
Comparison: 12/24/2019

CLINICAL DATA: Metastatic lung cancer. SRS to 5 brain metastases on
03/17/2019 followed by left parietal craniotomy for tumor resection
on 03/18/2019. SRS to 2 additional metastases on 01/01/2020.

EXAM:
MRI HEAD WITHOUT AND WITH CONTRAST
TECHNIQUE: Multiplanar, multiecho pulse sequences of the brain and surrounding
structures were obtained without and with intravenous contrast.
CONTRAST:  10mL MULTIHANCE GADOBENATE DIMEGLUMINE 529 MG/ML IV SOLN

[Series 2: FLAIR · sagittal · 3.0mm · 0.75mm/px · 2 of 39 slices shown (1 of 2)]
[im 1/39]
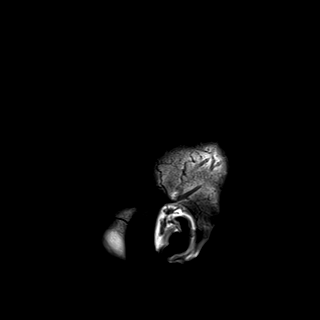
[im 39/39]
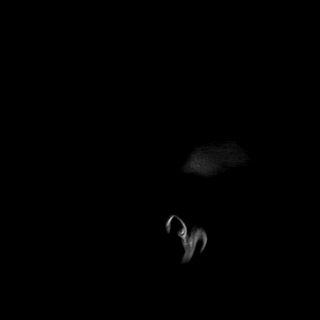

[Series 3: DWI · axial · 3.0mm · 1.50mm/px · z∈[-78,+75]mm · 5 of 82 slices shown (1 of 2)]
[im 1/82]
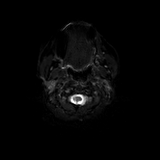
[im 21/82]
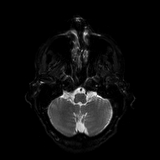
[im 41/82]
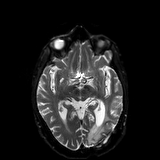
[im 61/82]
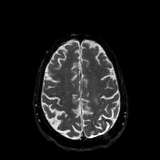
[im 82/82]
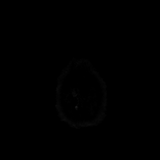

[Series 4: DWI · axial · 3.0mm · 1.50mm/px · z∈[-78,+75]mm · 2 of 41 slices shown (2 of 2)]
[im 1/41]
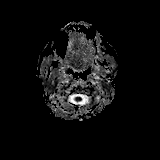
[im 41/41]
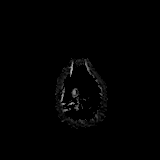

[Series 5: T2 · axial · 5.0mm · 0.57mm/px · z∈[-80,+73]mm · 2 of 27 slices shown]
[im 1/27]
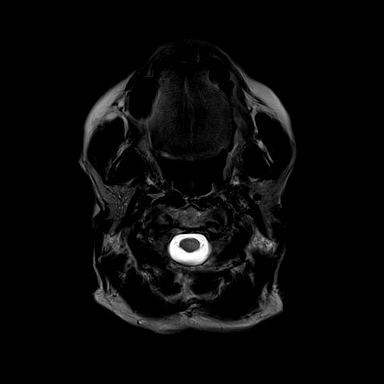
[im 27/27]
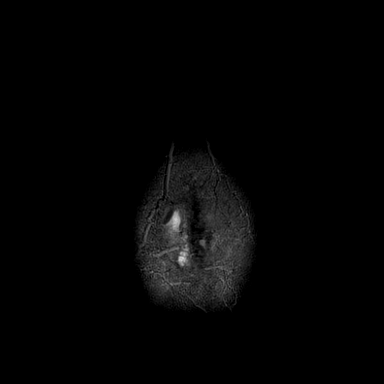

[Series 7: swi_images · axial · 1.5mm · 0.90mm/px · z∈[-74,+67]mm · 6 of 96 slices shown]
[im 1/96]
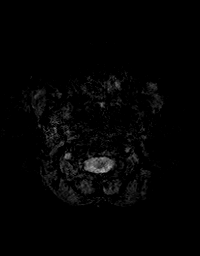
[im 20/96]
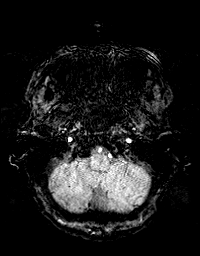
[im 39/96]
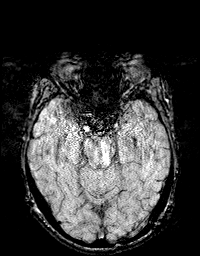
[im 58/96]
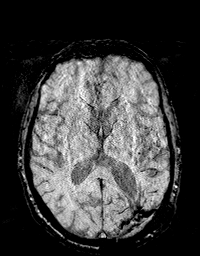
[im 77/96]
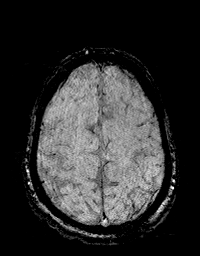
[im 96/96]
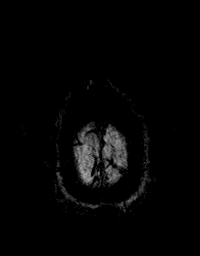

[Series 8: FLAIR · axial · 3.0mm · 0.57mm/px · z∈[-107,+52]mm · 3 of 54 slices shown (2 of 2)]
[im 1/54]
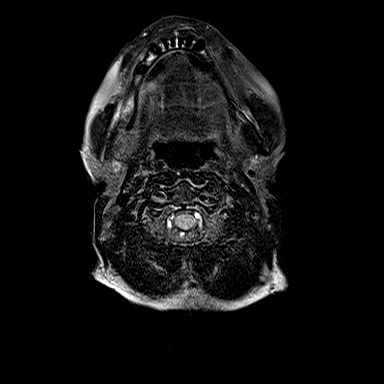
[im 27/54]
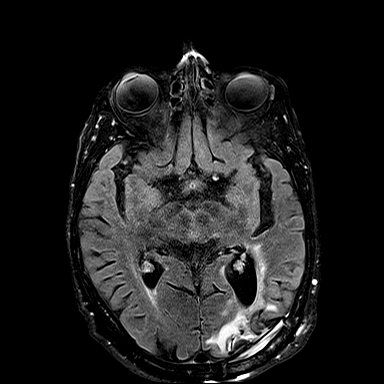
[im 54/54]
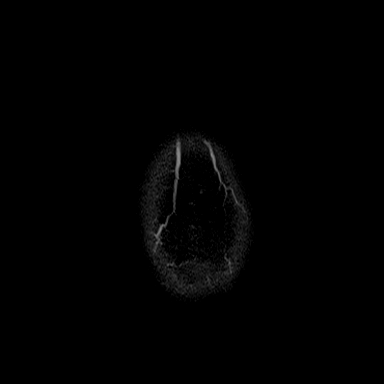

[Series 9: T1 · axial · 1.0mm · 0.75mm/px · z∈[-107,+52]mm · 10 of 160 slices shown]
[im 1/160]
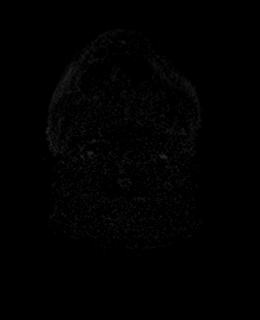
[im 18/160]
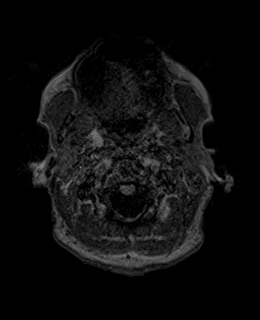
[im 36/160]
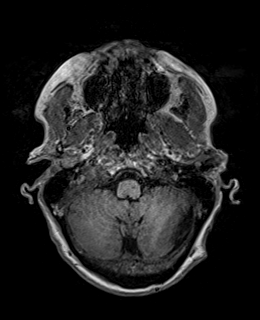
[im 54/160]
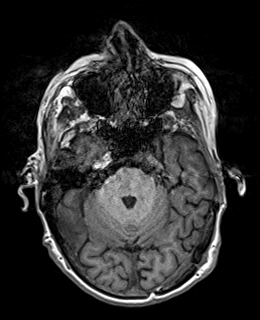
[im 71/160]
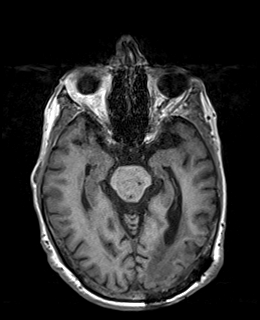
[im 89/160]
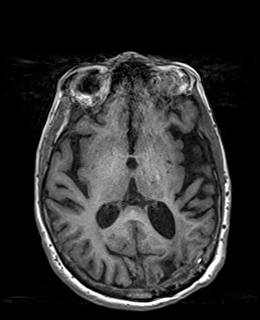
[im 107/160]
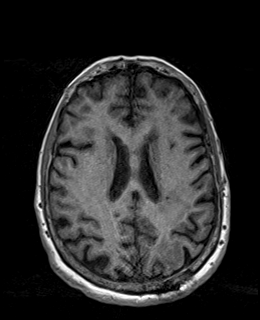
[im 124/160]
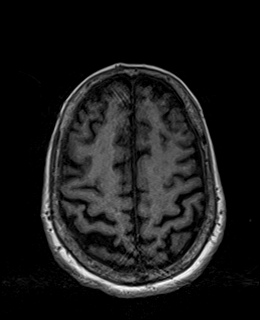
[im 142/160]
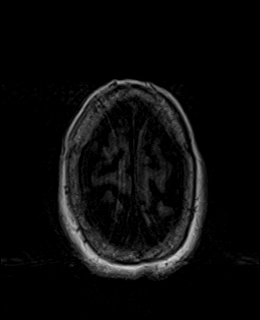
[im 160/160]
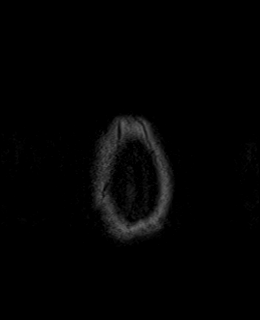

[Series 10: T2 post-contrast · coronal · 3.0mm · 0.57mm/px · 3 of 47 slices shown]
[im 1/47]
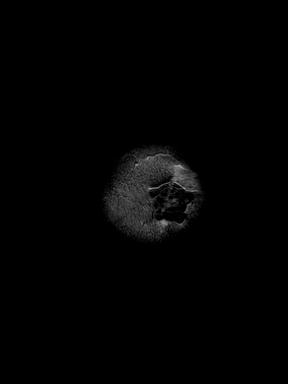
[im 24/47]
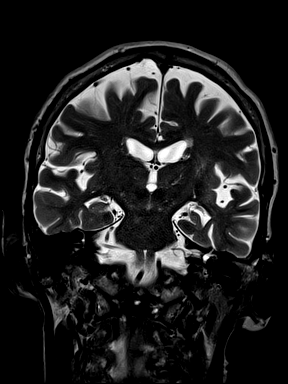
[im 47/47]
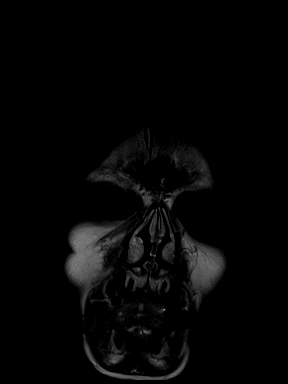

[Series 11: T1 post-contrast · axial · 1.0mm · 0.75mm/px · z∈[-107,+52]mm · 10 of 160 slices shown (1 of 2)]
[im 1/160]
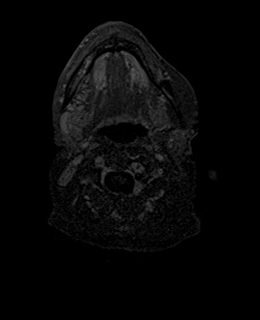
[im 18/160]
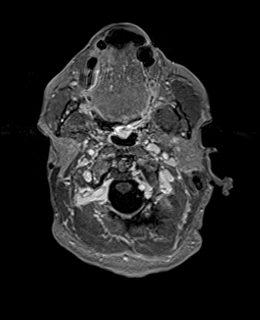
[im 36/160]
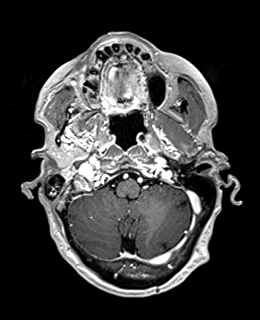
[im 54/160]
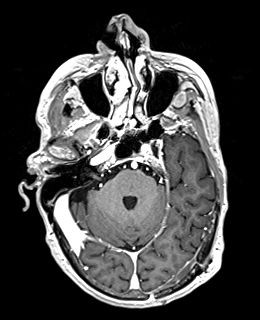
[im 71/160]
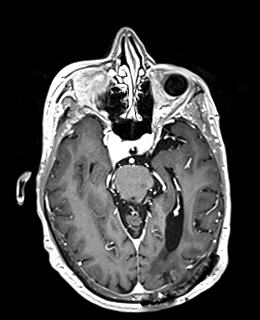
[im 89/160]
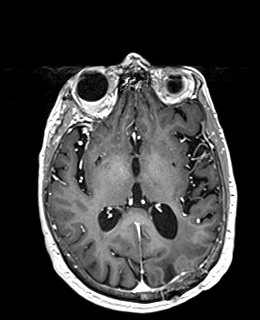
[im 107/160]
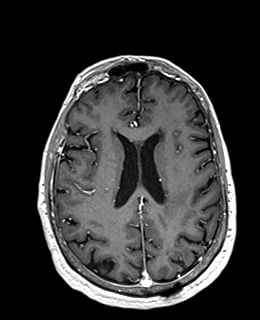
[im 124/160]
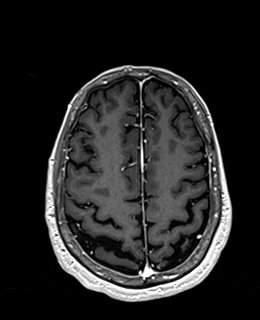
[im 142/160]
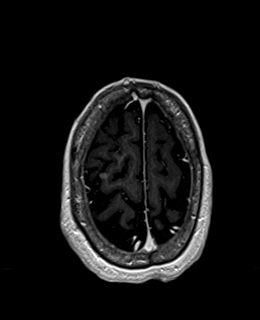
[im 160/160]
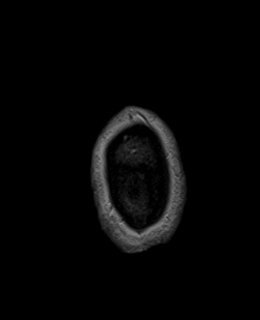

[Series 12: T1 post-contrast · coronal · 3.0mm · 0.57mm/px · 3 of 47 slices shown (2 of 2)]
[im 1/47]
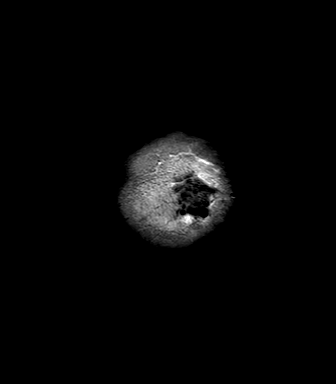
[im 24/47]
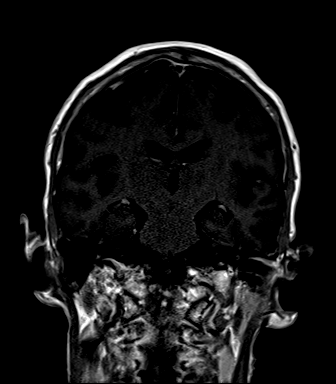
[im 47/47]
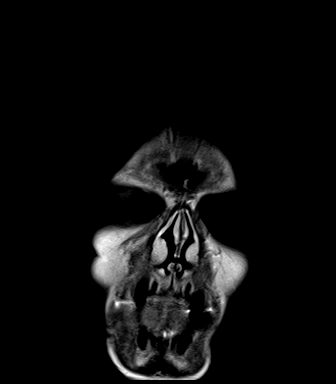

[Series 13: FLAIR post-contrast · sagittal · 3.0mm · 0.75mm/px · 2 of 39 slices shown]
[im 1/39]
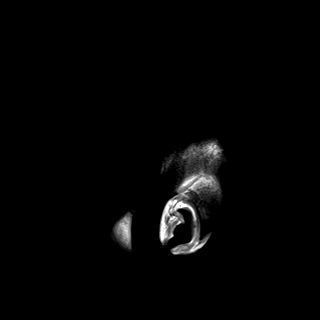
[im 39/39]
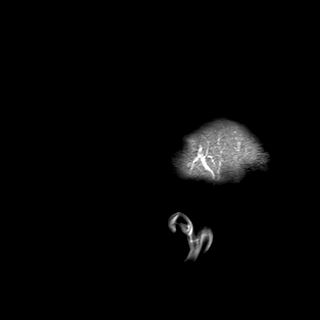

[48 of 48 positions shown; findings below may reference images not displayed]

FINDINGS: BRAIN

New Lesions:

1. 4 mm enhancing lesion in the medial right cerebellum (series 11,
image 42).
2. 5 mm enhancing lesion in the high posterior right frontal lobe
(series 11, image 141)

Larger lesions: A hemosiderin stained left parieto-occipital
resection cavity is again noted with largely stable curvilinear
enhancement which is likely treatment related. The small region of
increased enhancement at the superolateral aspect of the cavity on
the prior study has decreased, while a 3 mm focus of adjacent
enhancement has increased (series 11, image 86). Moderate
nonenhancing T2 hyperintensity in the surrounding white matter is
unchanged.

Stable or Smaller lesions:

1. 2 mm enhancing lesion in the medial left frontal lobe, decreased
in size (series 11 image 118, previously 5 mm).
2. The enhancing right internal capsule lesion on the prior study is
no longer visible.

Other Brain findings: No acute infarct or midline shift is evident.
A small fluid collection subjacent to the left parieto-occipital
craniotomy is unchanged. Small T2 hyperintensities in the cerebral
white matter bilaterally and in the pons are also unchanged and
nonspecific but compatible with mild chronic small vessel ischemic
disease. There is mild cerebral atrophy

Vascular: Major intracranial vascular flow voids are preserved.
Unchanged slight aneurysmal dilatation of a left MCA branch vessel
in a left parietal sulcus measuring 2 mm (series 11, image 89).

Skull and upper cervical spine: No suspicious marrow lesion.

Sinuses/Orbits: Bilateral cataract extraction. Clear paranasal
sinuses. Trace left mastoid fluid.

Other: None.
IMPRESSION: 1. Two new subcentimeter metastases in the right cerebellum and
right frontal lobe.
2. Decreased size of the 2 lesions that were treated in [DATE].
3. Minimal mixed interval changes in enhancement at the left
parieto-occipital resection cavity favoring evolving post treatment
changes. Continued attention on follow-up.

## 2022-03-08 ENCOUNTER — Inpatient Hospital Stay: Payer: PPO | Admitting: Hematology and Oncology

## 2022-03-08 ENCOUNTER — Telehealth: Payer: Self-pay | Admitting: *Deleted

## 2022-03-08 ENCOUNTER — Other Ambulatory Visit: Payer: Self-pay | Admitting: *Deleted

## 2022-03-08 ENCOUNTER — Other Ambulatory Visit: Payer: Self-pay

## 2022-03-08 ENCOUNTER — Inpatient Hospital Stay: Payer: PPO | Attending: Hematology and Oncology

## 2022-03-08 VITALS — BP 116/65 | HR 77 | Temp 97.8°F | Resp 18 | Ht 60.0 in | Wt 96.7 lb

## 2022-03-08 DIAGNOSIS — E039 Hypothyroidism, unspecified: Secondary | ICD-10-CM | POA: Diagnosis not present

## 2022-03-08 DIAGNOSIS — C7931 Secondary malignant neoplasm of brain: Secondary | ICD-10-CM | POA: Diagnosis not present

## 2022-03-08 DIAGNOSIS — C3432 Malignant neoplasm of lower lobe, left bronchus or lung: Secondary | ICD-10-CM | POA: Insufficient documentation

## 2022-03-08 DIAGNOSIS — C3492 Malignant neoplasm of unspecified part of left bronchus or lung: Secondary | ICD-10-CM

## 2022-03-08 DIAGNOSIS — G629 Polyneuropathy, unspecified: Secondary | ICD-10-CM | POA: Insufficient documentation

## 2022-03-08 LAB — CBC WITH DIFFERENTIAL (CANCER CENTER ONLY)
Abs Immature Granulocytes: 0.02 10*3/uL (ref 0.00–0.07)
Basophils Absolute: 0 10*3/uL (ref 0.0–0.1)
Basophils Relative: 0 %
Eosinophils Absolute: 0.1 10*3/uL (ref 0.0–0.5)
Eosinophils Relative: 1 %
HCT: 37.9 % (ref 36.0–46.0)
Hemoglobin: 11.7 g/dL — ABNORMAL LOW (ref 12.0–15.0)
Immature Granulocytes: 0 %
Lymphocytes Relative: 9 %
Lymphs Abs: 0.6 10*3/uL — ABNORMAL LOW (ref 0.7–4.0)
MCH: 26.9 pg (ref 26.0–34.0)
MCHC: 30.9 g/dL (ref 30.0–36.0)
MCV: 87.1 fL (ref 80.0–100.0)
Monocytes Absolute: 0.5 10*3/uL (ref 0.1–1.0)
Monocytes Relative: 7 %
Neutro Abs: 5.8 10*3/uL (ref 1.7–7.7)
Neutrophils Relative %: 83 %
Platelet Count: 258 10*3/uL (ref 150–400)
RBC: 4.35 MIL/uL (ref 3.87–5.11)
RDW: 15.4 % (ref 11.5–15.5)
WBC Count: 7.1 10*3/uL (ref 4.0–10.5)
nRBC: 0 % (ref 0.0–0.2)

## 2022-03-08 LAB — CMP (CANCER CENTER ONLY)
ALT: 17 U/L (ref 0–44)
AST: 21 U/L (ref 15–41)
Albumin: 4.3 g/dL (ref 3.5–5.0)
Alkaline Phosphatase: 47 U/L (ref 38–126)
Anion gap: 5 (ref 5–15)
BUN: 16 mg/dL (ref 8–23)
CO2: 33 mmol/L — ABNORMAL HIGH (ref 22–32)
Calcium: 9.5 mg/dL (ref 8.9–10.3)
Chloride: 101 mmol/L (ref 98–111)
Creatinine: 0.52 mg/dL (ref 0.44–1.00)
GFR, Estimated: >60 mL/min (ref >60)
Glucose, Bld: 76 mg/dL (ref 70–99)
Potassium: 4 mmol/L (ref 3.5–5.1)
Sodium: 139 mmol/L (ref 135–145)
Total Bilirubin: 0.2 mg/dL — ABNORMAL LOW (ref 0.3–1.2)
Total Protein: 7 g/dL (ref 6.5–8.1)

## 2022-03-08 NOTE — Progress Notes (Signed)
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 0/14 lymph nodes.  No perineural invasion or LVI.  negative margins 2) 03/03/2019: presented to ED with expressive aphasia, found to have multiple brain metastasis and underwent SRS on 03/17/2019. 3) 03/18/2019: Stereotactic left parietal craniotomy for resection of tumor 4) 04/30/2020: Brain MRI on showed 2 new subcentimeter meta stasis in the right cerebellum and right frontal lobe. 5)  05/20/2020: Stereotactic radiation therapy was completed 6) 06/23/2020: establish care with Dr. [name]  7) 08/29/2020: prescribed osimirtinib therapy due to next metastatic disease in the brain.  8) 09/05/2020: started osimirtinib 71m PO daily 9) 10/19/2020: CT Chest showed no evidence of residual disease 10) 11/21/2020: desquamation and erythema of finger tips. Concern for HFS. Temporarily holding therapy  11) 12/05/2020: restarted Tagrisso after resolution of the HFS symptoms 12) 07/12/2021: last visit with Dr. [facility] [place]  13) 03/08/2022: return to the care of Dr. DLorenso Courier Interval History:  Ruth SCathi Roan81y.o. female with medical history significant for metastatic adenocarcinoma of the lung with brain metastasis who presents for a follow up visit. The patient's last visit was on 07/12/2021.  In the interim since the last visit the [facility] [place] She presents today to transfer back.   On exam today [name] reports she has been well overall in the interim since her last visit.  Unfortunately she has continued to lose weight and is down approximately 12 pounds.  She notes that she continues to struggle with neuropathy which typically goes down her right side down to her knee.  She notes that it is quite uncomfortable.  She reports that she continues on her baseline level of 2 L of oxygen and does occasionally feel shortness of breath, particular on exertion.  [place] [facility]  She notes that the oncology care she received was excellent but she had some social issues regarding her accommodations out there.  He has been tolerating the Tagrisso quite well without much difficulty.  She did have a 2-week gap where she was not taking the medication because she had a COVID infection.  She otherwise has been quite well and is willing and able to proceed with osimertinib therapy.  She currently denies any fevers, chills, sweats, nausea vomiting or diarrhea.  A full ten point ROS is listed below.  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    corrected by surgery 08/21/13   Anxiety disorder    Aortic aneurysm ([place]    Brain metastasis ([place]    lung cancer s/p resection 7/20   Cancer [place]    Phreesia [id number]/[id number]  Complication of anesthesia 2011   bloodpressure dropped during colonoscopy, not problems since   Compression  fracture    COPD (chronic obstructive pulmonary disease) (HCC)    Depression    Diastolic dysfunction    Diverticulosis    Dysphagia    Emphysema lung (HCC) 11/08/2014   Headache    Hiatal hernia    History of kidney stones    Hypothyroidism    IBS (irritable bowel  syndrome)    Iron deficiency anemia due to chronic blood loss 12/05/2016   On home O2 12/15/2013   chronic hypoxia   Pneumonia    PUD (peptic ulcer disease)    17yr   Shortness of breath    with exertion   SIADH (syndrome of inappropriate ADH production) (HCC)    Trigeminal neuralgia     SURGICAL HISTORY: Past Surgical History:  Procedure Laterality Date   ABDOMINAL HYSTERECTOMY     APPLICATION OF CRANIAL NAVIGATION N/A 03/18/2019   Procedure: APPLICATION OF CRANIAL NAVIGATION;  Surgeon: NConsuella Lose MD;  [place]  Service: Neurosurgery;  Laterality: N/A;   BACK SURGERY  January 13, 2014   BRAIN SURGERY N/A    Phreesia 03/28/2020   CATARACT EXTRACTION     CATARACT EXTRACTION W/PHACO  09/02/2012   Procedure: CATARACT EXTRACTION PHACO AND INTRAOCULAR LENS PLACEMENT (IOC);  Surgeon: MElta GuadeloupeT. SGershon Crane MD;  Location: [facility];  Service: Ophthalmology;  Laterality: Left;  CDE:10.35   CHOLECYSTECTOMY     COLONOSCOPY  2011   hyperplastic polyp, 3-4 small cecal AVMs, nonbleeding   COLONOSCOPY N/A 11/23/2014   Dr. FOneida Alar moderate diverticulosis, hemorrhoids, redundant colon. next TCS in 10-15 years.    CRANIOTOMY Left 03/18/2019   Procedure: Left stereotactic craniotomy for tumor resection;  Surgeon: NConsuella Lose MD;  [place]  Service: Neurosurgery;  Laterality: Left;  Left stereotactic craniotomy for tumor resection   ENDARTERECTOMY Right 08/21/2013   Procedure: RIGHT CAROTID ANEURYSM RESECTION;  Surgeon: TRosetta Posner MD;  [place]  Service: Vascular;  Laterality: Right;   ESOPHAGEAL DILATION  02/24/2018   Procedure: ESOPHAGEAL DILATION;  Surgeon: FDanie Binder MD;  Location: [facility];  Service: Endoscopy;;   ESOPHAGOGASTRODUODENOSCOPY  11/13/2009   w/dilation to 138m gastric ulceration (H.Pylori) s/p treatment   ESOPHAGOGASTRODUODENOSCOPY  11/21/2009   distal esophageal web, gastritis   ESOPHAGOGASTRODUODENOSCOPY  03/14/12   SLIRS:[id number] the  distal esophagus/Mild gastritis/small HH. + H.pylori, prescribed Pylera. Finished treatment.    ESOPHAGOGASTRODUODENOSCOPY N/A 12/13/2017   Dr. FiOneida Alaresophageal web s/p dilation, gastritis, no h.pylori   ESOPHAGOGASTRODUODENOSCOPY N/A 02/24/2018   Dr. FiOneida Alarweb in prox esophagus and in distal esophagus s/p dilation, mild gastritis, mild pyloric stenosis, mild post ulcer duodenal deformity at D1/D2   fatty tumor removal from lt groin     FRACTURE SURGERY Left    hand and left ring finger   GIVENS CAPSULE STUDY N/A 12/26/2017   incomplete   GIVENS CAPSULE STUDY N/A 02/24/2018   normal small bowel   KNEE SURGERY     patella tendon repair june 2019 [facility]   LUNG CANCER SURGERY  12/2010   Left VATS, minithoracotomy, LLL superior segmentectomy   ORIF PATELLA Right 03/18/2018   Procedure: OPEN REDUCTION INTERNAL (ORIF) FIXATION RIGHT PATELLA;  Surgeon: HaCarole CivilMD;  Location: [facility];  Service: Orthopedics;  Laterality: Right;   ORIF WRIST FRACTURE Left 04/09/2014   Procedure: OPEN REDUCTION INTERNAL FIXATION (ORIF) WRIST FRACTURE;  Surgeon: TiRenette ButtersMD;  Location: [place] Service: Orthopedics;  Laterality: Left;   PERCUTANEOUS PINNING Left 04/09/2014  Procedure: PERCUTANEOUS PINNING EXTREMITY;  Surgeon: Renette Butters, MD;  Location: [place];  Service: Orthopedics;  Laterality: Left;   SAVORY DILATION N/A 12/13/2017   Procedure: SAVORY DILATION;  Surgeon: Danie Binder, MD;  Location[facility];  Service: Endoscopy;  Laterality: N/A;   TUBAL LIGATION     vocal cord surgery  02/06/2011   laryngoscopy with bilateral vocal cord Radiesse injection for vocal cord paralysis   YAG LASER APPLICATION Left 84/69/6295   Procedure: YAG LASER APPLICATION;  Surgeon: Rutherford Guys, MD;  Location: [facility];  Service: Ophthalmology;  Laterality: Left;    SOCIAL HISTORY: Social History   Socioeconomic History   Marital status: Divorced    Spouse name: Not on file   Number of children: Not  on file   Years of education: Not on file   Highest education level: Not on file  Occupational History   Not on file  Tobacco Use   Smoking status: Former    Packs/day: 0.50    Years: 20.00    Pack years: 10.00    Types: Cigarettes    Quit date: 12/21/2010    Years since quitting: 11.2   Smokeless tobacco: Never   Tobacco comments:    smoking cessation info given and reviewed   Vaping Use   Vaping Use: Never used  Substance and Sexual Activity   Alcohol use: Not Currently    Alcohol/week: 0.0 standard drinks   Drug use: No   Sexual activity: Yes    Birth control/protection: Surgical  Other Topics Concern   Not on file  Social History Narrative   Divorced since 40.Lives alone with a dog.Marland KitchenRetired.  She receives Meals on Wheels.   Social Determinants of Health   Financial Resource Strain: Low Risk    Difficulty of Paying Living Expenses: Not hard at all  Food Insecurity: No Food Insecurity   Worried About Charity fundraiser in the Last Year: Never true   [place] in the Last Year: Never true  Transportation Needs: No Transportation Needs   Lack of Transportation (Medical): No   Lack of Transportation (Non-Medical): No  Physical Activity: Inactive   Days of Exercise per Week: 0 days   Minutes of Exercise per Session: 0 min  Stress: No Stress Concern Present   Feeling of Stress : Not at all  Social Connections: Socially Isolated   Frequency of Communication with Friends and Family: More than three times a week   Frequency of Social Gatherings with Friends and Family: More than three times a week   Attends Religious Services: Never   Marine scientist or Organizations: No   Attends Music therapist: Never   Marital Status: Divorced  Human resources officer Violence: Not At Risk   Fear of Current or Ex-Partner: No   Emotionally Abused: No   Physically Abused: No   Sexually Abused: No    FAMILY HISTORY: Family History  Problem Relation Age of  Onset   Pulmonary embolism Mother    Heart attack Father    Hypertension Father    Liver cancer Sister    Cancer Sister    Cancer Brother    Cancer Daughter    Breast cancer Daughter    Colon cancer Neg Hx     ALLERGIES:  is allergic to ciprofloxacin hcl, sulfonamide derivatives, ciprofloxacin, iohexol, iodinated contrast media, and prozac [fluoxetine hcl].  MEDICATIONS:  Current Outpatient Medications  Medication Sig Dispense Refill   albuterol (VENTOLIN  HFA) 108 (90 Base) MCG/ACT inhaler INHALE 2 PUFFS EVERY 6 HOURS AS NEEDED FOR WHEEZING OR SHORTNESS OF BREATH. 8.5 g 0   ALPRAZolam (XANAX) 0.5 MG tablet TAKE (1) TABLET BY MOUTH TWICE A DAY AS NEEDED. 60 tablet 0   citalopram (CELEXA) 40 MG tablet TAKE (1/2) TABLET BY MOUTH AT BEDTIME. 15 tablet 5   [employer] ACETAMINOPHEN 325 MG tablet Take by mouth daily.     denosumab (PROLIA) 60 MG/ML SOSY injection INJECT INTO UPPER ARM, THIGH, OR ABDOMEN ONCE. 1 mL 0   diphenhydrAMINE (BENADRYL) 50 MG tablet Take 1 tablet (50 mg total) by mouth once for 1 dose. Take one hour prior to CT scan 1 tablet 0   DULoxetine (CYMBALTA) 30 MG capsule Take 30 mg by mouth daily.     Fluticasone-Umeclidin-Vilant (TRELEGY ELLIPTA) 100-62.5-25 MCG/ACT AEPB Inhale 1 puff into the lungs daily. 3 each 3   gabapentin (NEURONTIN) 100 MG capsule TAKE 1 CAPSULE BY MOUTH THREE TIMES A DAY. 90 capsule 5   guaifenesin (HUMIBID E) 400 MG TABS tablet Take 400 mg by mouth 4 (four) times daily.     levothyroxine (SYNTHROID) 50 MCG tablet TAKE 1 TABLET BEFORE BREAKFAST. 30 tablet 5   Multiple Vitamin (MULTIVITAMIN WITH MINERALS) TABS tablet Take 1 tablet by mouth daily.     ondansetron (ZOFRAN) 4 MG tablet TAKE 1 TABLET BY MOUTH 3 TIMES DAILY AS NEEDED FOR NAUSEA AND VOMITING. 90 tablet 0   osimertinib mesylate (TAGRISSO) 80 MG tablet TAKE 1 TABLET (80 MG TOTAL) BY MOUTH DAILY. 30 tablet 2   OVER THE COUNTER MEDICATION Bone Support 2-4 tabs daily     OXcarbazepine (TRILEPTAL) 150  MG tablet TAKE (1) TABLET BY MOUTH TWICE DAILY. 60 tablet 5   predniSONE (DELTASONE) 20 MG tablet Take by mouth.     Probiotic Product (ALIGN PO) Take by mouth daily. (Patient not taking: Reported on 03/08/2022)     No current facility-administered medications for this visit.    REVIEW OF SYSTEMS:   Constitutional: ( - ) fevers, ( - )  chills , ( - ) night sweats Eyes: ( - ) blurriness of vision, ( - ) double vision, ( - ) watery eyes Ears, nose, mouth, throat, and face: ( - ) mucositis, ( - ) sore throat Respiratory: ( - ) cough, ( - ) dyspnea, ( - ) wheezes Cardiovascular: ( - ) palpitation, ( - ) chest discomfort, ( - ) lower extremity swelling Gastrointestinal:  ( - ) nausea, ( - ) heartburn, ( - ) change in bowel habits Skin: ( - ) abnormal skin rashes Lymphatics: ( - ) new lymphadenopathy, ( - ) easy bruising Neurological: ( - ) numbness, ( - ) tingling, ( - ) new weaknesses Behavioral/Psych: ( - ) mood change, ( - ) new changes  All other systems were reviewed with the patient and are negative.  PHYSICAL EXAMINATION: Telephone Visit  LABORATORY DATA:  I have reviewed the data as listed    Latest Ref Rng & Units 03/08/2022    8:55 AM 07/12/2021   10:36 AM 06/14/2021   10:50 AM  CBC  WBC 4.0 - 10.5 K/uL 7.1   4.3   5.8    Hemoglobin 12.0 - 15.0 g/dL 11.7   10.9   11.6    Hematocrit 36.0 - 46.0 % 37.9   32.6   34.1    Platelets 150 - 400 K/uL 258   222   250  Latest Ref Rng & Units 03/08/2022    8:55 AM 07/12/2021   10:36 AM 06/14/2021   10:50 AM  CMP  Glucose 70 - 99 mg/dL 76   73   84    BUN 8 - 23 mg/dL _0 Creatinine 0.44 - 1.00 mg/dL 0.52   0.61   0.68    Sodium 135 - 145 mmol/L 139   126   128    Potassium 3.5 - 5.1 mmol/L 4.0   4.3   4.6    Chloride 98 - 111 mmol/L 101   90   92    CO2 22 - 32 mmol/L 33   27   28    Calcium 8.9 - 10.3 mg/dL 9.5   9.0   9.5    Total Protein 6.5 - 8.1 g/dL 7.0   6.4   6.5    Total Bilirubin 0.3 - 1.2 mg/dL 0.2   0.3    0.4    Alkaline Phos 38 - 126 U/L 47   58   48    AST 15 - 41 U/L _1 ALT 0 - 44 U/L _2 RADIOGRAPHIC STUDIES:  MRI Brain 08/05/2020:   CLINICAL DATA:  Trigeminal neuralgia. Headache, cluster/trigeminal. Brain/CNS neoplasm, assess treatment response. Metastatic lung cancer. Additional history provided: SRS to 5 brain metastases 03/17/2019, left parietal craniotomy for tumor resection 03/18/2019, SRS to 2 additional metastases 01/01/2020.   EXAM: MRI HEAD WITHOUT AND WITH CONTRAST   MRA HEAD WITHOUT CONTRAST   TECHNIQUE: Multiplanar, multiecho pulse sequences of the brain and surrounding structures were obtained without and with intravenous contrast. Angiographic images of the head were obtained using MRA technique without contrast.   CONTRAST:  4m MULTIHANCE GADOBENATE DIMEGLUMINE 529 MG/ML IV SOLN   COMPARISON:  Prior brain MRI examinations 04/30/2020 and earlier. MRA head 03/04/2019.   FINDINGS: MRI HEAD FINDINGS   Brain:   Stable generalized cerebral atrophy.   Again demonstrated is a hemosiderin stained left parietooccipital resection cavity. Irregular and gyriform enhancement has increased from the prior examination, now measuring up to 12 mm in thickness (for instance as seen on series 14, image 90) (series 15, image 9). This irregular enhancement extends to the site of a previously demonstrated 3 mm nodular focus of enhancement within the superolateral aspect of the cavity. Surrounding T2/FLAIR hyperintense signal abnormality has not significantly changed.   A previously demonstrated 4 mm lesion within the medial right cerebellum is no longer appreciated.   A 4 mm enhancing lesion within the left cerebellar hemisphere was present on the prior examination but seen to better advantage on today's study (series 14, image 48).   New 2 mm enhancing lesion more posteriorly within the medial left cerebellar hemisphere (series 14,  image 50).   New 2 mm enhancing lesion within the lateral left cerebellum (series 14, image 42).   A 3 mm lesion within the high posterior right frontal lobe has decreased in size (series 14, image 145) (previously 5 mm).   A previously demonstrated 2 mm enhancing lesion within the left frontal lobe has increased in size, now measuring 8 mm (series 14, image 125) (series 11, image 39) . The lesion now demonstrates central T2/FLAIR hyperintensity and T1 hyperintensity with a peripheral low signal rim. Precontrast T1 hyperintensity limits evaluation for enhancement at this site.  Multifocal T2/FLAIR hyperintensity elsewhere within the cerebral white matter and within the pons is nonspecific, but compatible chronic small vessel ischemic disease.   No evidence of acute infarction.   No extra-axial fluid collection.   No midline shift.   Vascular: Expected proximal arterial flow voids.   Skull and upper cervical spine: No focal suspicious marrow lesion.   Sinuses/Orbits: Visualized orbits show no acute finding. Trace ethmoid sinus mucosal thickening. No significant mastoid effusion.   MRA HEAD FINDINGS   The intracranial internal carotid arteries are patent. The M1 middle cerebral arteries are patent without significant stenosis. No M2 proximal branch occlusion or high-grade proximal stenosis is identified. The anterior cerebral arteries are patent. 1-2 mm inferiorly projecting vascular protrusion arising from the supraclinoid right ICA which may reflect an infundibulum or small aneurysm (series 103, image 12).   The intracranial vertebral arteries are patent. The basilar artery is patent. The posterior cerebral arteries are patent.   IMPRESSION: MRI brain:   1. Irregular and gyriform enhancement at site of the left parietooccipital resection cavity has increased, now measuring up to 12 mm in thickness. This enhancement extends to the site of a previously demonstrated 3  mm nodular enhancing focus within the superolateral aspect of the cavity. Findings may reflect post-treatment inflammation or recurrent tumor. Short interval MRI follow-up is recommended. Surrounding T2 hyperintense signal abnormality has not appreciably changed. 2. New 2 mm metastasis within the posteromedial left cerebellar hemisphere. 3. New 2 mm metastasis within the lateral left cerebellum. 4. An additional 4 mm metastasis within the left cerebellum was present on the prior MRI of 04/30/2020, but is seen to better advantage on today's study. 5. A previously demonstrated 4 mm metastasis within the medial right cerebellum is no longer appreciated 6. 8 mm metastasis within the paramedian left frontal lobe, increased in size and now demonstrating precontrast T1 hyperintensity which limits evaluation for enhancement. 7. A 3 mm lesion within the high posterior right frontal lobe has decreased in size.   MRA head:   1. No intracranial large vessel occlusion or proximal high-grade arterial stenosis. 2. 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     Electronically Signed   By: Kellie Simmering DO   On: 08/05/2020 13:49  No results found.   ASSESSMENT & PLAN [name] 81 y.o. female with medical history significant for metastatic adenocarcinoma of the lung with brain metastasis who presents for a follow up visit.  After review the records, review of the labs, review the imaging, discussion with the patient the findings are most consistent with a stage IV adenocarcinoma of the lung with a small lung lesion and metastatic spread to the brain.  Her metastatic brain mets have been treated with resection of tumor on 03/18/2019 as well as SRS on 12/12/2019.  Most recently she was found to have 2 new subcentimeter lesions and underwent stereotactic radiation therapy which was completed on 05/20/2020. Unfortunately on 08/05/2020 she was noted to have new CNS  metastatic disease.   There are limitations in what therapies can be used for this patient given her poor functional status.  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  After extensive discussions with the patient's about the risks and benefits of this treatment she was agreeable to starting osimertinib 80 mg p.o. daily for her metastatic adenocarcinoma of the lung. She started this treatment on 09/05/2020.  On discussion today Mrs.  Sanford is tolerating Tagrisso well and is willing and able to proceed at this time.  Restaging CT scans are planned for August 2023.   #Stage IV ([id number]) adenocarcinoma of the lung: -Left lower lobe resection on 12/21/2010, 14 lymph nodes negative, 2.2 cm tumor size, margins negative. -most recent MRI brain scan shows stable disease. No evidence of Chest/abdomen involvement on scan from 02/09/2022.  --started therapy with osimertinib 80 mg PO daily for her EGFR Exon 19 mutation. She started on 09/05/2020 -- Patient is a poor candidate for systemic therapy, though she does have an EGFR Exon 19 mutation.  Plan:  -CT chest with contrast on 02/09/2022 showed no active disease in the chest. Continue CT scans q 3 months. Next due August 2023 --labs today adequate for continued treatment: Creatinine 0.52, hemoglobin 11.7, white blood cell count 7.1, and platelets of 258 --RTC in 4 weeks time to assure continued tolerance of osimirtenib.   #Hand Foot Syndrome, resolved --previously noted to have desquamation and erythema of finger tips. Resolved on exam today.  --held therapy for 1-2 weeks until resolution --patient had resolution of symptoms after 2 weeks off therapy. Encouraged continue aggressive moisturizing lotion.    #Brain metastasis, stable -Presentation with expressive aphasia to ER on 03/03/2019 along with right-sided weakness.  Found to have multiple brain metastasis and underwent SRS on 03/17/2019. -Stereotactic left  parietal craniotomy for resection of tumor on 03/18/2019, biopsy consistent with adenocarcinoma of lung primary. -Underwent SRS on 01/01/2020 for 2 subcentimeter brain lesions. -Brain MRI on 04/30/2020 showed 2 new subcentimeter meta stasis in the right cerebellum and right frontal lobe. -Stereotactic radiation therapy was completed on 05/20/2020 --most recent MRI on 02/09/2022 showed stable disease. Continue systemic therapy as above.   No orders of the defined types were placed in this encounter.   All questions were answered. The patient knows to call the clinic with any problems, questions or concerns.  A total of more than 30 minutes were spent on this encounter and over half of that time was spent on counseling and coordination of care as outlined above.   Ledell Peoples, MD Department of Hematology/Oncology [place] [facility] Phone: [phone] Pager: [phone] Email: [email or web address]  03/13/2022 1:37 PM

## 2022-03-08 NOTE — Progress Notes (Signed)
Patient relocated back to [facility].  Per Dr Mickeal Skinner patient needs to have repeat MRI done around September.  Order added.

## 2022-03-08 NOTE — Telephone Encounter (Signed)
Patient had MRI Brain completed on 10/22/2021 and 02/08/2022 at [place] request to them to get imaging mailed to [place] to add to the system.  MD will make me aware of when we need to see her now that she has relocated back to [place].

## 2022-03-12 ENCOUNTER — Telehealth: Payer: Self-pay | Admitting: *Deleted

## 2022-03-12 NOTE — Telephone Encounter (Signed)
Received vm message from pt inquiring about the time frame for her Brain MRI. She is asking how soon does it need to be done. She states Patrick Jupiter has a lot going on this week so this week would not be a good week for it.  Please advise

## 2022-03-12 NOTE — Telephone Encounter (Signed)
Received vm from pt inquiring about her brain MRI. She is concerned that it is this week and she may have transportation concerns. TCT patient. No answer but was able to leave detailed message on her identified vm. Advised that the scan is not until September so she will have plenty of time to make sure she has transportation. Advised that she can call back with any questions or concerns to [phone]

## 2022-03-13 ENCOUNTER — Other Ambulatory Visit: Payer: Self-pay | Admitting: Family Medicine

## 2022-03-13 ENCOUNTER — Other Ambulatory Visit: Payer: Self-pay

## 2022-03-13 ENCOUNTER — Telehealth: Payer: Self-pay

## 2022-03-13 ENCOUNTER — Encounter (HOSPITAL_COMMUNITY): Payer: Self-pay | Admitting: Internal Medicine

## 2022-03-13 MED ORDER — ALBUTEROL SULFATE HFA 108 (90 BASE) MCG/ACT IN AERS: INHALATION_SPRAY | RESPIRATORY_TRACT | 0 refills | Status: AC

## 2022-03-13 NOTE — Telephone Encounter (Signed)
Refused Ventolin HFA inhaler.  This is a duplicate request.   It was signed and ordered by Dr. Narda Rutherford at [place].

## 2022-03-13 NOTE — Telephone Encounter (Signed)
Pt called requesting refill on Albuterol inhaler. Refill sent to [place].

## 2022-03-16 ENCOUNTER — Telehealth: Payer: Self-pay | Admitting: Hematology and Oncology

## 2022-03-16 ENCOUNTER — Other Ambulatory Visit: Payer: Self-pay | Admitting: Family Medicine

## 2022-03-16 NOTE — Telephone Encounter (Signed)
.  Called patient to schedule appointment per 6/9 inbasket, patient is aware of date and time.   

## 2022-03-19 ENCOUNTER — Ambulatory Visit: Payer: PPO | Admitting: Family Medicine

## 2022-03-19 ENCOUNTER — Other Ambulatory Visit: Payer: Self-pay | Admitting: Hematology and Oncology

## 2022-03-19 ENCOUNTER — Telehealth: Payer: Self-pay | Admitting: Pharmacy Technician

## 2022-03-19 ENCOUNTER — Other Ambulatory Visit (HOSPITAL_COMMUNITY): Payer: Self-pay

## 2022-03-19 ENCOUNTER — Encounter (HOSPITAL_COMMUNITY): Payer: Self-pay | Admitting: Internal Medicine

## 2022-03-19 ENCOUNTER — Telehealth: Payer: Self-pay | Admitting: Pharmacist

## 2022-03-19 MED ORDER — OSIMERTINIB MESYLATE 80 MG PO TABS
ORAL_TABLET | Freq: Every day | ORAL | 2 refills | Status: DC
  Filled 2022-03-19: qty 30, fill #0
  Filled 2022-03-19: qty 30, 30d supply, fill #0
  Filled 2022-04-05: qty 30, 30d supply, fill #1
  Filled 2022-05-09: qty 30, 30d supply, fill #2

## 2022-03-19 NOTE — Telephone Encounter (Signed)
Oral Oncology Patient Advocate Encounter  After completing a benefits investigation, prior authorization for Tagrisso is not required at this time through [employer] D.  Patient's copay is $0.     Lady Deutscher, CPhT-Adv Pharmacy Patient Advocate Specialist [place] Patient Advocate Team Direct Number: [phone]  Fax: [phone]

## 2022-03-19 NOTE — Telephone Encounter (Signed)
Oral Chemotherapy Pharmacist Encounter   Spoke with patient today to follow up regarding patient's oral chemotherapy medication: Tagrisso (osimertinib)  Original Start date of oral chemotherapy: 09/05/2020  Pt reports 0 tablets/doses of Tagrisso missed in the last month.   Pt reports tolerating the Tagrisso - she states that she still has some issues with dry skin, but has been diligent using skin moisturizer to help with this.   CBC w/ Diff and CMP from 03/08/22 reviewed - labs stable.  Medication will be shipped from the [facility] on 03/19/22 for delivery to patient's home on 03/20/22. Patient has a $0 copay.   Patient knows to call the office with questions or concerns.  Leron Croak, PharmD, BCPS Hematology/Oncology Clinical Pharmacist [facility] and [place] [phone] 03/19/2022 10:10 AM

## 2022-03-20 ENCOUNTER — Telehealth: Payer: Self-pay | Admitting: Hematology and Oncology

## 2022-03-20 NOTE — Telephone Encounter (Signed)
Patient is scheduled for 03/26/22

## 2022-03-20 NOTE — Telephone Encounter (Signed)
.  Called patient to schedule appointment per 6/13 inbasket, patient is aware of date and time.

## 2022-03-20 NOTE — Telephone Encounter (Signed)
Per 6/13 provider reschedule called and left message for pt about reschedule  left details and call back number.

## 2022-03-23 IMAGING — DX DG HIP (WITH OR WITHOUT PELVIS) 2-3V*R*
3 series · 3 of 3 positions shown · non-contrast
Comparison: 09/24/2018

CLINICAL DATA: Right hip pain.  No injury.

EXAM:
DG HIP (WITH OR WITHOUT PELVIS) 2-3V RIGHT

[pelvis ap]
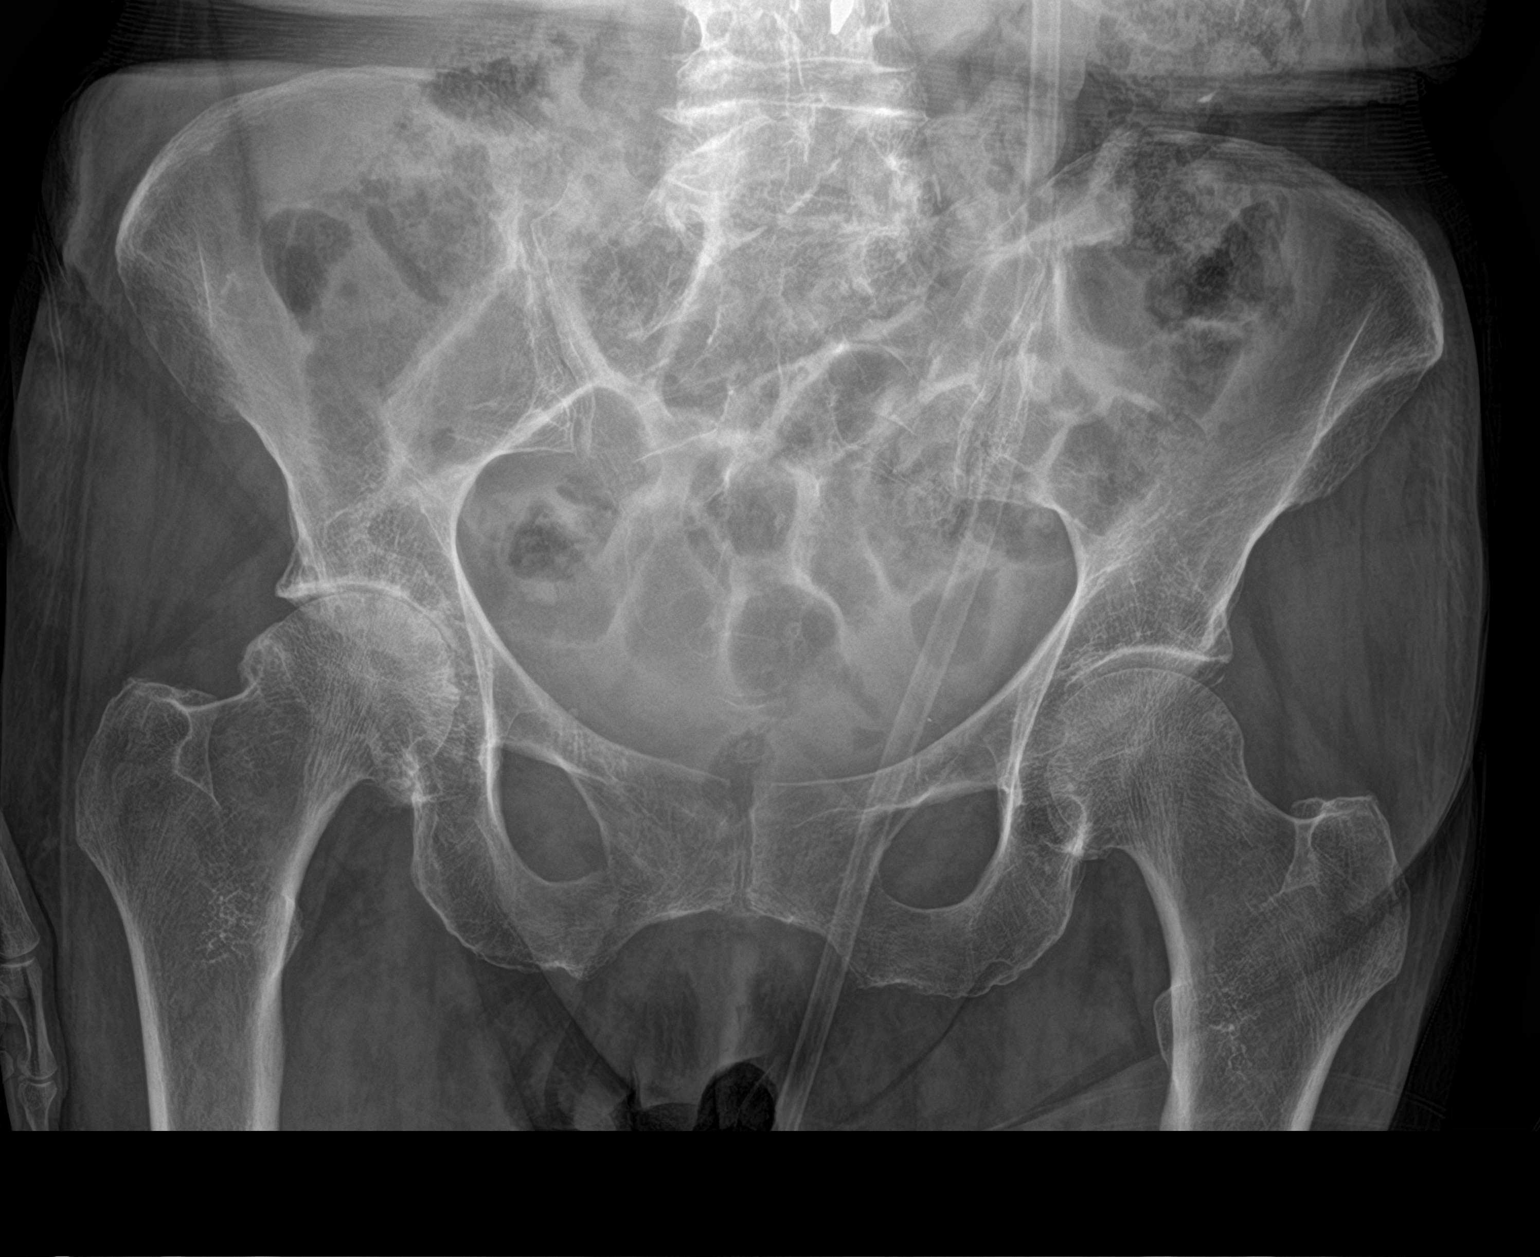

[hip ap]
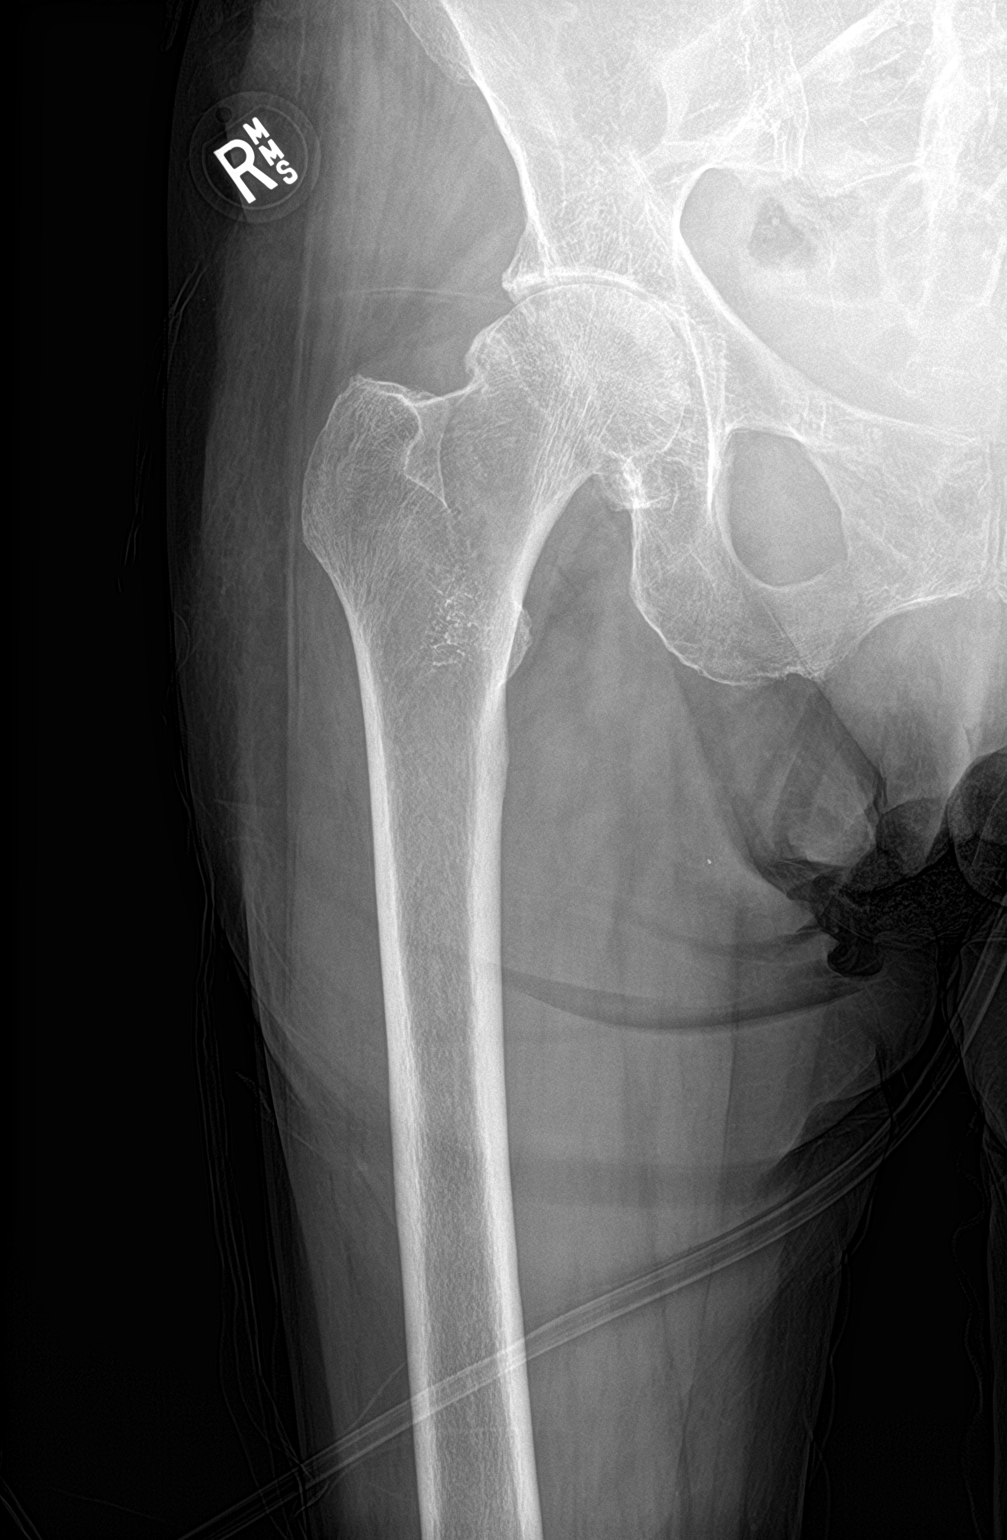

[hip lat]
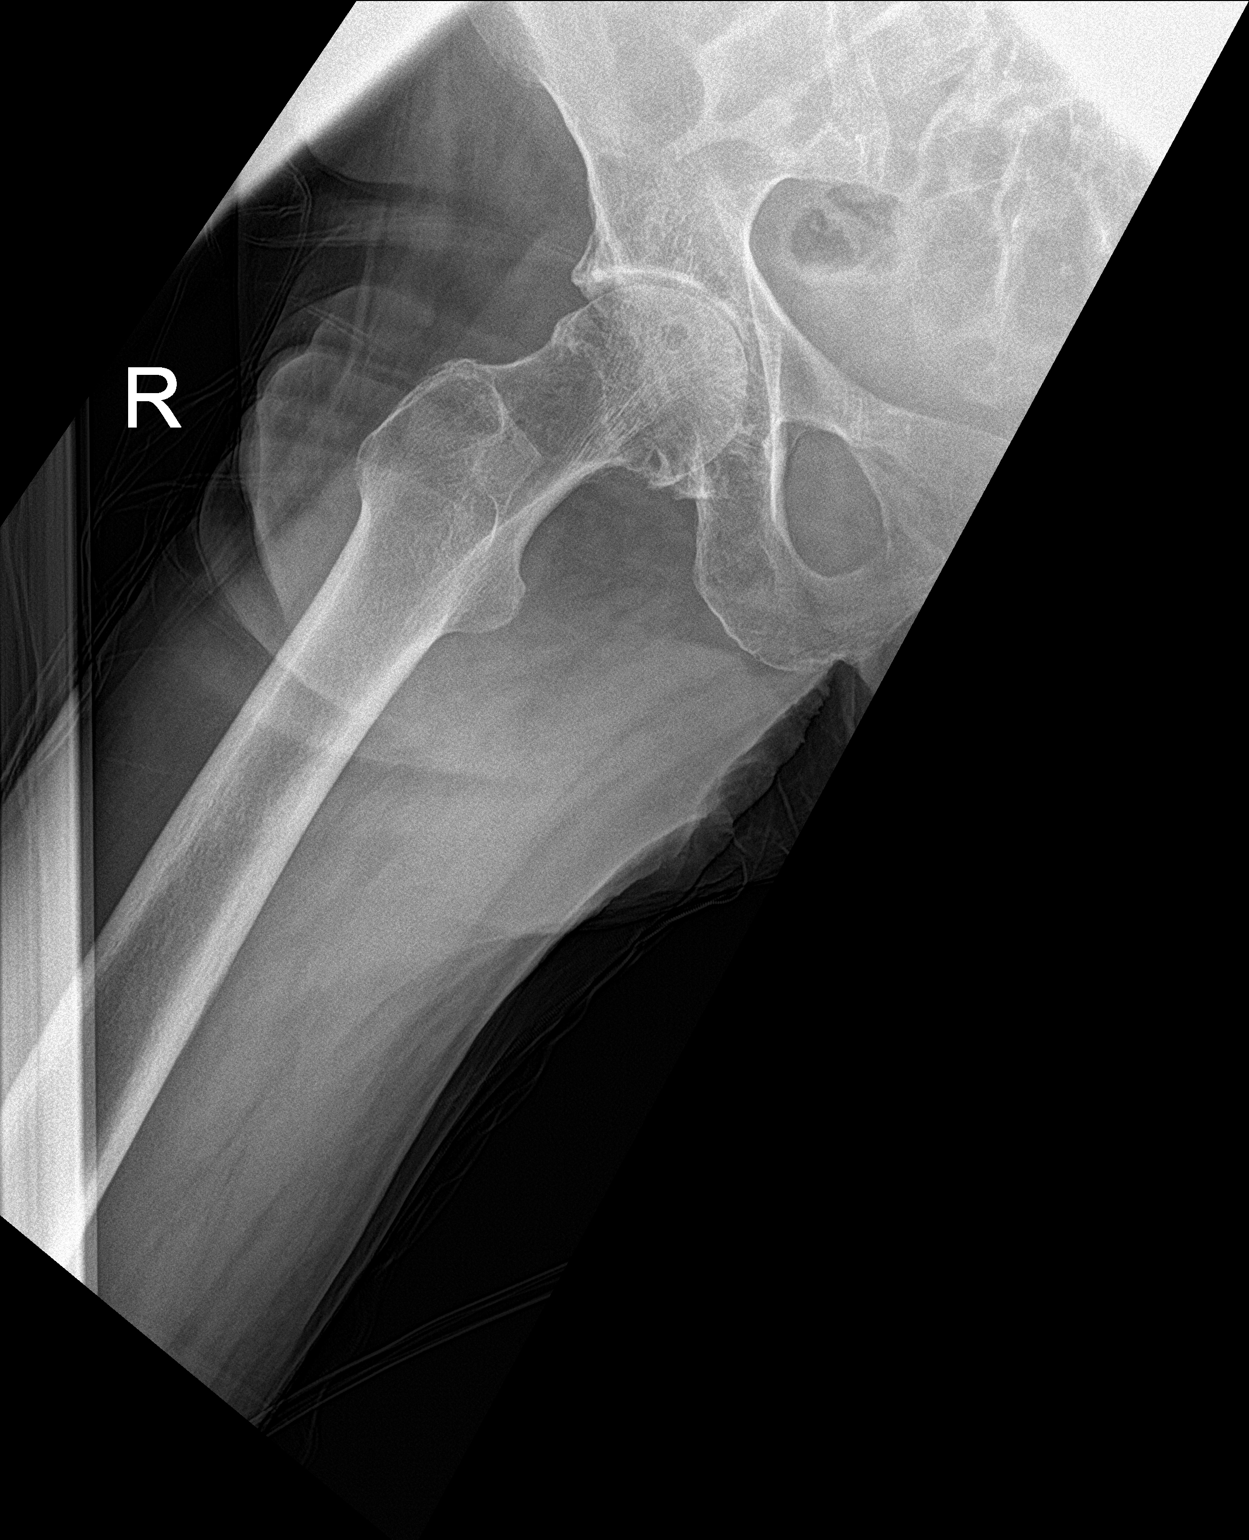

[3 of 3 positions shown; findings below may reference images not displayed]

FINDINGS: Exam demonstrates moderate osteoarthritis of the right hip with
slight interval progression. Mild degenerate change of the left hip.
No evidence of acute fracture or dislocation. Mild diffuse decreased
bone mineralization is present. Degenerative change of the spine.
IMPRESSION: 1. No acute findings.
2. Moderate osteoarthritis of the right hip with slight interval
progression.

## 2022-03-24 ENCOUNTER — Other Ambulatory Visit: Payer: Self-pay | Admitting: Family Medicine

## 2022-03-26 ENCOUNTER — Ambulatory Visit (INDEPENDENT_AMBULATORY_CARE_PROVIDER_SITE_OTHER): Payer: PPO | Admitting: Family Medicine

## 2022-03-26 ENCOUNTER — Telehealth: Payer: Self-pay

## 2022-03-26 VITALS — BP 94/68 | HR 99 | Temp 98.2°F | Ht 61.0 in | Wt 92.8 lb

## 2022-03-26 DIAGNOSIS — E441 Mild protein-calorie malnutrition: Secondary | ICD-10-CM

## 2022-03-26 DIAGNOSIS — C3432 Malignant neoplasm of lower lobe, left bronchus or lung: Secondary | ICD-10-CM | POA: Diagnosis not present

## 2022-03-26 DIAGNOSIS — J441 Chronic obstructive pulmonary disease with (acute) exacerbation: Secondary | ICD-10-CM | POA: Diagnosis not present

## 2022-03-26 DIAGNOSIS — E039 Hypothyroidism, unspecified: Secondary | ICD-10-CM

## 2022-03-26 DIAGNOSIS — J449 Chronic obstructive pulmonary disease, unspecified: Secondary | ICD-10-CM

## 2022-03-26 MED ORDER — DOXYCYCLINE HYCLATE 100 MG PO TABS: 100.0000 mg | ORAL_TABLET | Freq: Two times a day (BID) | ORAL | 0 refills | Status: DC

## 2022-03-26 MED ORDER — PREDNISONE 20 MG PO TABS: 40.0000 mg | ORAL_TABLET | Freq: Every day | ORAL | 0 refills | Status: DC

## 2022-03-26 NOTE — Telephone Encounter (Signed)
handicap placard form put in Dr's folder to be completed 6/19

## 2022-03-26 NOTE — Telephone Encounter (Signed)
Pt was in for an ov and needed to get these forms filled out by pcp. Pt has a handicap placard, and form from [place] that pt needs filled out. Please call pt when these forms are ready to be picked up by pt. Forms placed in nurse's folder. Please advise.  Cb#: [phone]

## 2022-03-26 NOTE — Progress Notes (Signed)
Subjective:    Patient ID: [name], female    DOB: 1941/06/12, 81 y.o.   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  She was originally diagnosed in 2012.  She underwent left lower lobe resection for well differentiated stage Ib bronchoalveolar cancer.  Margins were negative.  In 2020 she presented to the emergency room with aphasia.  MRI revealed multiple brain mets.  She underwent stereotactic left parietal craniotomy for resection of tumor in June 2020.  July 2021, MRI of the brain revealed two new metastases in the right cerebellum and right frontal lobe.  She was treated with stereotactic radiation therapy.  She has been managed with osimiritinib by oncology since 2021.  She recently returned to the area in June after having moved to [place] last year.  I have not physically seen the patient since 2021.  She is here today to reestablish care with me.  CMP and CBC were relatively normal 03/08/22 He has a history of hyponatremia however sodium level was recently 139.  She reports feeling weak and tired.  She is very frail and cachectic appearing.  On examination today her pulse ox is 88% on 2 L.  We will return up to 3 L, I am able to get her pulse ox above 90.  However she has left basilar crackles and diffuse expiratory wheezes despite taking Trelegy daily and albuterol every 6 hours per her report.  Reviewing her medication list she is on citalopram and duloxetine.  She is also on gabapentin and Trileptal.  She is not certain why she is taking all of these medication.  Duloxetine and gabapentin were added in [place] per her report.  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[place])     corrected by surgery 08/21/13   Anxiety disorder    Aortic aneurysm ([place])    Brain metastasis ([place])    lung cancer s/p resection 7/20   Cancer ([place])    Phreesia 44/10/270   Complication of anesthesia 2011   bloodpressure dropped during colonoscopy, not problems since   Compression fracture    COPD (chronic obstructive pulmonary disease) (HCC)    Depression    Diastolic dysfunction    Diverticulosis    Dysphagia    Emphysema lung ([place]) 11/08/2014   Headache    Hiatal hernia    History of kidney stones    Hypothyroidism    IBS (irritable bowel syndrome)    Iron deficiency anemia due to chronic blood loss 12/05/2016   On home O2 12/15/2013   chronic hypoxia   Pneumonia    PUD (peptic ulcer disease)    33yrs   Shortness of breath    with exertion   SIADH (syndrome of inappropriate ADH production) ([place])    Trigeminal neuralgia    Past Surgical History:  Procedure Laterality Date   ABDOMINAL HYSTERECTOMY     APPLICATION OF CRANIAL NAVIGATION N/A 03/18/2019   Procedure: APPLICATION OF CRANIAL NAVIGATION;  Surgeon: Consuella Lose, MD;  Location: [facility] [facility];  Service: Neurosurgery;  Laterality: N/A;   BACK SURGERY  January 13, 2014   BRAIN SURGERY N/A    Phreesia 03/28/2020   CATARACT EXTRACTION  CATARACT EXTRACTION W/PHACO  09/02/2012   Procedure: CATARACT EXTRACTION PHACO AND INTRAOCULAR LENS PLACEMENT (IOC);  Surgeon: Elta Guadeloupe T. 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 Oneida Alar: moderate diverticulosis, hemorrhoids, redundant colon. next TCS in 10-15 years.    CRANIOTOMY Left 03/18/2019   Procedure: Left stereotactic craniotomy for tumor resection;  Surgeon: Consuella Lose, MD;  Location: [facility];  Service: Neurosurgery;  Laterality: Left;  Left stereotactic craniotomy for tumor resection   ENDARTERECTOMY  Right 08/21/2013   Procedure: RIGHT CAROTID ANEURYSM RESECTION;  Surgeon: Rosetta Posner, MD;  Location: [facility];  Service: Vascular;  Laterality: Right;   ESOPHAGEAL DILATION  02/24/2018   Procedure: ESOPHAGEAL DILATION;  Surgeon: Danie Binder, MD;  [facility];  Service: Endoscopy;;   ESOPHAGOGASTRODUODENOSCOPY  11/13/2009   w/dilation to 73mm, gastric ulceration (H.Pylori) s/p treatment   ESOPHAGOGASTRODUODENOSCOPY  11/21/2009   distal esophageal web, gastritis   ESOPHAGOGASTRODUODENOSCOPY  03/14/12   ZSW:FUXNATFTD in the distal esophagus/Mild gastritis/small HH. + H.pylori, prescribed Pylera. Finished treatment.    ESOPHAGOGASTRODUODENOSCOPY N/A 12/13/2017   Dr. Oneida Alar: esophageal web s/p dilation, gastritis, no h.pylori   ESOPHAGOGASTRODUODENOSCOPY N/A 02/24/2018   Dr. Oneida Alar: web in prox esophagus and in distal esophagus s/p dilation, mild gastritis, mild pyloric stenosis, mild post ulcer duodenal deformity at D1/D2   fatty tumor removal from lt groin     FRACTURE SURGERY Left    hand and left ring finger   GIVENS CAPSULE STUDY N/A 12/26/2017   incomplete   GIVENS CAPSULE STUDY N/A 02/24/2018   normal small bowel   KNEE SURGERY     patella tendon repair june 2019 [facility]   LUNG CANCER SURGERY  12/2010   Left VATS, minithoracotomy, LLL superior segmentectomy   ORIF PATELLA Right 03/18/2018   Procedure: OPEN REDUCTION INTERNAL (ORIF) FIXATION RIGHT PATELLA;  Surgeon: Carole Civil, MD;  [facility];  Service: Orthopedics;  Laterality: Right;   ORIF WRIST FRACTURE Left 04/09/2014   Procedure: OPEN REDUCTION INTERNAL FIXATION (ORIF) WRIST FRACTURE;  Surgeon: Renette Butters, MD;  Location: [facility];  Service: Orthopedics;  Laterality: Left;   PERCUTANEOUS PINNING Left 04/09/2014   Procedure: PERCUTANEOUS PINNING EXTREMITY;  Surgeon: Renette Butters, MD;  Location: [facility];  Service: Orthopedics;  Laterality: Left;   SAVORY DILATION N/A 12/13/2017   Procedure: SAVORY DILATION;   Surgeon: Danie Binder, MD;  [facility];  Service: Endoscopy;  Laterality: N/A;   TUBAL LIGATION     vocal cord surgery  02/06/2011   laryngoscopy with bilateral vocal cord Radiesse injection for vocal cord paralysis   YAG LASER APPLICATION Left 32/20/2542   Procedure: YAG LASER APPLICATION;  Surgeon: Rutherford Guys, MD;  [facility];  Service: Ophthalmology;  Laterality: Left;   Current Outpatient Medications on File Prior to Visit  Medication Sig Dispense Refill   albuterol (VENTOLIN HFA) 108 (90 Base) MCG/ACT inhaler INHALE 2 PUFFS EVERY 6 HOURS AS NEEDED FOR WHEEZING OR SHORTNESS OF BREATH. 8.5 g 0   ALPRAZolam (XANAX) 0.5 MG tablet TAKE (1) TABLET BY MOUTH TWICE A DAY AS NEEDED. 60 tablet 0   citalopram (CELEXA) 40 MG tablet TAKE (1/2) TABLET BY MOUTH AT BEDTIME. 15 tablet 5   [employer]S ACETAMINOPHEN 325 MG tablet Take by mouth daily.  denosumab (PROLIA) 60 MG/ML SOSY injection INJECT INTO UPPER ARM, THIGH, OR ABDOMEN ONCE. 1 mL 0   diphenhydrAMINE (BENADRYL) 50 MG tablet Take 1 tablet (50 mg total) by mouth once for 1 dose. Take one hour prior to CT scan 1 tablet 0   DULoxetine (CYMBALTA) 30 MG capsule Take 30 mg by mouth daily.     Fluticasone-Umeclidin-Vilant (TRELEGY ELLIPTA) 100-62.5-25 MCG/ACT AEPB Inhale 1 puff into the lungs daily. 3 each 3   gabapentin (NEURONTIN) 100 MG capsule Take 1 capsule by mouth 3 times a day 90 capsule 11   guaifenesin (HUMIBID E) 400 MG TABS tablet Take 400 mg by mouth 4 (four) times daily.     levocetirizine (XYZAL) 5 MG tablet Take 1 tablet by mouth every day 30 tablet 11   levothyroxine (SYNTHROID) 50 MCG tablet Take 1 tablet by mouth before breakfast 30 tablet 11   Multiple Vitamin (MULTIVITAMIN WITH MINERALS) TABS tablet Take 1 tablet by mouth daily.     ondansetron (ZOFRAN) 4 MG tablet TAKE 1 TABLET BY MOUTH 3 TIMES DAILY AS NEEDED FOR NAUSEA AND VOMITING. 90 tablet 0   osimertinib mesylate (TAGRISSO) 80 MG tablet TAKE 1 TABLET (80 MG  TOTAL) BY MOUTH DAILY. 30 tablet 2   OVER THE COUNTER MEDICATION Bone Support 2-4 tabs daily     OXcarbazepine (TRILEPTAL) 150 MG tablet Take 1 tablet by mouth twice daily 60 tablet 11   predniSONE (DELTASONE) 20 MG tablet Take by mouth.     Probiotic Product (ALIGN PO) Take by mouth daily. (Patient not taking: Reported on 03/08/2022)     No current facility-administered medications on file prior to visit.   Allergies  Allergen Reactions   Ciprofloxacin Hcl Hives   Sulfonamide Derivatives Hives and Rash   Ciprofloxacin Hives   Iohexol Other (See Comments)    UNSPECIFIED REACTION  Desc: Per [facility], pt is allergic to IV contrast, no type of reaction was available. Pt cannot remember but first reacted around 15 yrs ago from an IVP. Notes were date from 2009 at urology center., Onset Date: [id number]    Iodinated Contrast Media Rash and Itching   Prozac [Fluoxetine Hcl] Rash   Social History   Socioeconomic History   Marital status: Divorced    Spouse name: Not on file   Number of children: Not on file   Years of education: Not on file   Highest education level: Not on file  Occupational History   Not on file  Tobacco Use   Smoking status: Former    Packs/day: 0.50    Years: 20.00    Total pack years: 10.00    Types: Cigarettes    Quit date: 12/21/2010    Years since quitting: 11.2   Smokeless tobacco: Never   Tobacco comments:    smoking cessation info given and reviewed   Vaping Use   Vaping Use: Never used  Substance and Sexual Activity   Alcohol use: Not Currently    Alcohol/week: 0.0 standard drinks of alcohol   Drug use: No   Sexual activity: Yes    Birth control/protection: Surgical  Other Topics Concern   Not on file  Social History Narrative   Divorced since 25.Lives alone with a dog.Marland KitchenRetired.  She receives Meals on Wheels.   Social Determinants of Health   Financial Resource Strain: Low Risk  (04/13/2021)   Overall Financial Resource Strain (CARDIA)     Difficulty of Paying Living Expenses: Not hard at all  Food Insecurity:  No Food Insecurity (04/13/2021)   Hunger Vital Sign    Worried About Running Out of Food in the Last Year: Never true    Ran Out of Food in the Last Year: Never true  Transportation Needs: No Transportation Needs (04/13/2021)   PRAPARE - Hydrologist (Medical): No    Lack of Transportation (Non-Medical): No  Physical Activity: Inactive (04/13/2021)   Exercise Vital Sign    Days of Exercise per Week: 0 days    Minutes of Exercise per Session: 0 min  Stress: No Stress Concern Present (04/13/2021)   [place]    Feeling of Stress : Not at all  Social Connections: Socially Isolated (04/13/2021)   Social Connection and Isolation Panel [NHANES]    Frequency of Communication with Friends and Family: More than three times a week    Frequency of Social Gatherings with Friends and Family: More than three times a week    Attends Religious Services: Never    Marine scientist or Organizations: No    Attends Archivist Meetings: Never    Marital Status: Divorced  Human resources officer Violence: Not At Risk (04/13/2021)   Humiliation, Afraid, Rape, and Kick questionnaire    Fear of Current or Ex-Partner: No    Emotionally Abused: No    Physically Abused: No    Sexually Abused: No      Current Outpatient Medications on File Prior to Visit  Medication Sig Dispense Refill   albuterol (VENTOLIN HFA) 108 (90 Base) MCG/ACT inhaler INHALE 2 PUFFS EVERY 6 HOURS AS NEEDED FOR WHEEZING OR SHORTNESS OF BREATH. 8.5 g 0   ALPRAZolam (XANAX) 0.5 MG tablet TAKE (1) TABLET BY MOUTH TWICE A DAY AS NEEDED. 60 tablet 0   citalopram (CELEXA) 40 MG tablet TAKE (1/2) TABLET BY MOUTH AT BEDTIME. 15 tablet 5   [employer]S ACETAMINOPHEN 325 MG tablet Take by mouth daily.     denosumab (PROLIA) 60 MG/ML SOSY injection INJECT INTO UPPER ARM, THIGH, OR ABDOMEN  ONCE. 1 mL 0   diphenhydrAMINE (BENADRYL) 50 MG tablet Take 1 tablet (50 mg total) by mouth once for 1 dose. Take one hour prior to CT scan 1 tablet 0   DULoxetine (CYMBALTA) 30 MG capsule Take 30 mg by mouth daily.     Fluticasone-Umeclidin-Vilant (TRELEGY ELLIPTA) 100-62.5-25 MCG/ACT AEPB Inhale 1 puff into the lungs daily. 3 each 3   gabapentin (NEURONTIN) 100 MG capsule Take 1 capsule by mouth 3 times a day 90 capsule 11   guaifenesin (HUMIBID E) 400 MG TABS tablet Take 400 mg by mouth 4 (four) times daily.     levocetirizine (XYZAL) 5 MG tablet Take 1 tablet by mouth every day 30 tablet 11   levothyroxine (SYNTHROID) 50 MCG tablet Take 1 tablet by mouth before breakfast 30 tablet 11   Multiple Vitamin (MULTIVITAMIN WITH MINERALS) TABS tablet Take 1 tablet by mouth daily.     ondansetron (ZOFRAN) 4 MG tablet TAKE 1 TABLET BY MOUTH 3 TIMES DAILY AS NEEDED FOR NAUSEA AND VOMITING. 90 tablet 0   osimertinib mesylate (TAGRISSO) 80 MG tablet TAKE 1 TABLET (80 MG TOTAL) BY MOUTH DAILY. 30 tablet 2   OVER THE COUNTER MEDICATION Bone Support 2-4 tabs daily     OXcarbazepine (TRILEPTAL) 150 MG tablet Take 1 tablet by mouth twice daily 60 tablet 11   predniSONE (DELTASONE) 20 MG tablet Take by mouth.  Probiotic Product (ALIGN PO) Take by mouth daily. (Patient not taking: Reported on 03/08/2022)     No current facility-administered medications on file prior to visit.   Allergies  Allergen Reactions   Ciprofloxacin Hcl Hives   Sulfonamide Derivatives Hives and Rash   Ciprofloxacin Hives   Iohexol Other (See Comments)    UNSPECIFIED REACTION  Desc: [facility], pt is allergic to IV contrast, no type of reaction was available. Pt cannot remember but first reacted around 15 yrs ago from an IVP. Notes were date from 2009 at urology center., Onset Date: [id number]    Iodinated Contrast Media Rash and Itching   Prozac [Fluoxetine Hcl] Rash   Social History   Socioeconomic History   Marital  status: Divorced    Spouse name: Not on file   Number of children: Not on file   Years of education: Not on file   Highest education level: Not on file  Occupational History   Not on file  Tobacco Use   Smoking status: Former    Packs/day: 0.50    Years: 20.00    Total pack years: 10.00    Types: Cigarettes    Quit date: 12/21/2010    Years since quitting: 11.2   Smokeless tobacco: Never   Tobacco comments:    smoking cessation info given and reviewed   Vaping Use   Vaping Use: Never used  Substance and Sexual Activity   Alcohol use: Not Currently    Alcohol/week: 0.0 standard drinks of alcohol   Drug use: No   Sexual activity: Yes    Birth control/protection: Surgical  Other Topics Concern   Not on file  Social History Narrative   Divorced since 39.Lives alone with a dog.Marland KitchenRetired.  She receives Meals on Wheels.   Social Determinants of Health   Financial Resource Strain: Low Risk  (04/13/2021)   Overall Financial Resource Strain (CARDIA)    Difficulty of Paying Living Expenses: Not hard at all  Food Insecurity: No Food Insecurity (04/13/2021)   Hunger Vital Sign    Worried About Running Out of Food in the Last Year: Never true    Ran Out of Food in the Last Year: Never true  Transportation Needs: No Transportation Needs (04/13/2021)   PRAPARE - Hydrologist (Medical): No    Lack of Transportation (Non-Medical): No  Physical Activity: Inactive (04/13/2021)   Exercise Vital Sign    Days of Exercise per Week: 0 days    Minutes of Exercise per Session: 0 min  Stress: No Stress Concern Present (04/13/2021)   [place]    Feeling of Stress : Not at all  Social Connections: Socially Isolated (04/13/2021)   Social Connection and Isolation Panel [NHANES]    Frequency of Communication with Friends and Family: More than three times a week    Frequency of Social Gatherings with Friends and  Family: More than three times a week    Attends Religious Services: Never    Marine scientist or Organizations: No    Attends Archivist Meetings: Never    Marital Status: Divorced  Human resources officer Violence: Not At Risk (04/13/2021)   Humiliation, Afraid, Rape, and Kick questionnaire    Fear of Current or Ex-Partner: No    Emotionally Abused: No    Physically Abused: No    Sexually Abused: No    Review of Systems  All other systems reviewed and  are negative.      Objective:   Physical Exam Constitutional:      General: She is not in acute distress.    Appearance: She is well-developed and underweight. She is ill-appearing. She is not diaphoretic.  HENT:     Mouth/Throat:     Mouth: No oral lesions.     Pharynx: Uvula midline. No oropharyngeal exudate or posterior oropharyngeal erythema.  Neck:     Thyroid: No thyromegaly.     Vascular: No JVD.  Cardiovascular:     Rate and Rhythm: Normal rate and regular rhythm.     Heart sounds: Normal heart sounds. No murmur heard. Pulmonary:     Effort: Pulmonary effort is normal. No respiratory distress.     Breath sounds: No stridor. Examination of the right-lower field reveals rhonchi. Wheezing, rhonchi and rales present.  Abdominal:     General: Bowel sounds are normal. There is no distension.     Palpations: Abdomen is soft.     Tenderness: There is no abdominal tenderness. There is no guarding or rebound.  Lymphadenopathy:     Cervical: No cervical adenopathy.  Neurological:     General: No focal deficit present.     Mental Status: She is alert and oriented to person, place, and time.  Psychiatric:        Mood and Affect: Mood normal.        Behavior: Behavior normal.        Thought Content: Thought content normal.            Assessment & Plan:  Hypothyroidism, unspecified type - Plan: TSH  COPD (chronic obstructive pulmonary disease) with chronic bronchitis (HCC)  COPD exacerbation ([place])  Primary  cancer of left lower lobe of lung ([place])  Mild protein-calorie malnutrition ([place]) Due to concerns about polypharmacy contributing to his weakness and lethargy I am on to have the patient hold duloxetine and hold gabapentin.  Continue citalopram and continue Trileptal.  I encouraged her to start drinking boost or Ensure to try to improve her protein intake to get stronger.  Check a TSH to ensure adequate dosage of levothyroxine.  Treat COPD exacerbation with prednisone 40 mg daily for 7 days and doxycycline 100 mg twice daily for 7 days

## 2022-03-26 NOTE — Telephone Encounter (Signed)
Requested medication (s) are due for refill today: yes  Requested medication (s) are on the active medication list: yes  Last refill:  09/22/21 #60 with 0 RF  Future visit scheduled: Seen today, 03/26/22  Notes to clinic:  This medication can not be delegated, please assess.        Requested Prescriptions  Pending Prescriptions Disp Refills   ALPRAZolam (XANAX) 0.5 MG tablet [Pharmacy Med Name: ALPRAZOLAM 0.5 MG TABLET] 60 tablet 0    Sig: TAKE (1) TABLET BY MOUTH TWICE A DAY AS NEEDED.     Not Delegated - Psychiatry: Anxiolytics/Hypnotics 2 Failed - 03/24/2022  1:42 PM      Failed - This refill cannot be delegated      Failed - Urine Drug Screen completed in last 360 days      Failed - Valid encounter within last 6 months    Recent Outpatient Visits           6 months ago Right ear pain   [place], Jessica A, NP   7 months ago COPD exacerbation ([place])   [facility] Eulogio Bear, NP   1 year ago [place] Dennard Schaumann, Cammie Mcgee, MD   1 year ago Right hip pain   [place] Dennard Schaumann, Cammie Mcgee, MD   1 year ago [place], Bailey, FNP              Passed - Patient is not pregnant

## 2022-03-27 LAB — TSH: TSH: 1.29 mIU/L (ref 0.40–4.50)

## 2022-03-27 NOTE — Telephone Encounter (Signed)
handicap placard form completed per Dr. Dennard Schaumann. Form sent to front desk to call pt for pick up 03/27/22

## 2022-03-27 NOTE — Telephone Encounter (Signed)
LOV 03/26/22 Last refill 09/22/21, #60, 0 refills  Please review, thanks!

## 2022-03-28 ENCOUNTER — Telehealth: Payer: Self-pay | Admitting: *Deleted

## 2022-03-28 NOTE — Telephone Encounter (Signed)
Received vm message from pt stating that Dr. Dennard Schaumann was not going to refill pt's Alprazolam. She was asking if Dr. [name] would fill it. Reviewed pt's chart and noted that Dr. Dennard Schaumann did refill it yesterday.  TCT patient and advised that Dr. Dennard Schaumann sent in the refill.  She voiced understanding.

## 2022-03-29 ENCOUNTER — Telehealth: Payer: Self-pay

## 2022-03-29 NOTE — Telephone Encounter (Signed)
Pt called in stating that the med she was prescribed to help her sleep has worked well for her. Pt states that although she sleeps better her oxygen levels seem to drop in the low 80's at night. Pt wanted to know is there anything she could do about that? Please advise.  Cb#: [phone]

## 2022-04-01 ENCOUNTER — Other Ambulatory Visit: Payer: Self-pay | Admitting: Hematology and Oncology

## 2022-04-01 DIAGNOSIS — C3492 Malignant neoplasm of unspecified part of left bronchus or lung: Secondary | ICD-10-CM

## 2022-04-02 ENCOUNTER — Inpatient Hospital Stay: Payer: PPO

## 2022-04-02 ENCOUNTER — Other Ambulatory Visit: Payer: Self-pay

## 2022-04-02 ENCOUNTER — Inpatient Hospital Stay (HOSPITAL_BASED_OUTPATIENT_CLINIC_OR_DEPARTMENT_OTHER): Payer: PPO | Admitting: Hematology and Oncology

## 2022-04-02 VITALS — BP 114/75 | HR 79 | Temp 98.2°F | Resp 16 | Wt 95.5 lb

## 2022-04-02 DIAGNOSIS — G500 Trigeminal neuralgia: Secondary | ICD-10-CM

## 2022-04-02 DIAGNOSIS — C3492 Malignant neoplasm of unspecified part of left bronchus or lung: Secondary | ICD-10-CM | POA: Diagnosis not present

## 2022-04-02 DIAGNOSIS — C3432 Malignant neoplasm of lower lobe, left bronchus or lung: Secondary | ICD-10-CM | POA: Diagnosis not present

## 2022-04-02 LAB — CMP (CANCER CENTER ONLY)
ALT: 37 U/L (ref 0–44)
AST: 26 U/L (ref 15–41)
Albumin: 4 g/dL (ref 3.5–5.0)
Alkaline Phosphatase: 40 U/L (ref 38–126)
Anion gap: 4 — ABNORMAL LOW (ref 5–15)
BUN: 21 mg/dL (ref 8–23)
CO2: 33 mmol/L — ABNORMAL HIGH (ref 22–32)
Calcium: 9.3 mg/dL (ref 8.9–10.3)
Chloride: 102 mmol/L (ref 98–111)
Creatinine: 0.53 mg/dL (ref 0.44–1.00)
GFR, Estimated: >60 mL/min (ref >60)
Glucose, Bld: 136 mg/dL — ABNORMAL HIGH (ref 70–99)
Potassium: 4.2 mmol/L (ref 3.5–5.1)
Sodium: 139 mmol/L (ref 135–145)
Total Bilirubin: 0.3 mg/dL (ref 0.3–1.2)
Total Protein: 6.5 g/dL (ref 6.5–8.1)

## 2022-04-02 LAB — CBC WITH DIFFERENTIAL (CANCER CENTER ONLY)
Abs Immature Granulocytes: 0.02 10*3/uL (ref 0.00–0.07)
Basophils Absolute: 0 10*3/uL (ref 0.0–0.1)
Basophils Relative: 0 %
Eosinophils Absolute: 0 10*3/uL (ref 0.0–0.5)
Eosinophils Relative: 0 %
HCT: 37.3 % (ref 36.0–46.0)
Hemoglobin: 11.6 g/dL — ABNORMAL LOW (ref 12.0–15.0)
Immature Granulocytes: 0 %
Lymphocytes Relative: 6 %
Lymphs Abs: 0.4 10*3/uL — ABNORMAL LOW (ref 0.7–4.0)
MCH: 27.4 pg (ref 26.0–34.0)
MCHC: 31.1 g/dL (ref 30.0–36.0)
MCV: 88 fL (ref 80.0–100.0)
Monocytes Absolute: 0.3 10*3/uL (ref 0.1–1.0)
Monocytes Relative: 4 %
Neutro Abs: 5.7 10*3/uL (ref 1.7–7.7)
Neutrophils Relative %: 90 %
Platelet Count: 282 10*3/uL (ref 150–400)
RBC: 4.24 MIL/uL (ref 3.87–5.11)
RDW: 16.5 % — ABNORMAL HIGH (ref 11.5–15.5)
WBC Count: 6.4 10*3/uL (ref 4.0–10.5)
nRBC: 0 % (ref 0.0–0.2)

## 2022-04-03 ENCOUNTER — Encounter: Payer: Self-pay | Admitting: Family Medicine

## 2022-04-03 ENCOUNTER — Other Ambulatory Visit: Payer: Self-pay | Admitting: Family Medicine

## 2022-04-03 DIAGNOSIS — J441 Chronic obstructive pulmonary disease with (acute) exacerbation: Secondary | ICD-10-CM

## 2022-04-03 LAB — TSH: TSH: 0.535 u[IU]/mL (ref 0.350–4.500)

## 2022-04-04 ENCOUNTER — Ambulatory Visit (HOSPITAL_COMMUNITY)
Admission: RE | Admit: 2022-04-04 | Discharge: 2022-04-04 | Disposition: A | Discharge status: Home / Self Care | Payer: PPO | Source: Ambulatory Visit | Attending: Family Medicine | Admitting: Family Medicine

## 2022-04-04 DIAGNOSIS — J441 Chronic obstructive pulmonary disease with (acute) exacerbation: Secondary | ICD-10-CM | POA: Diagnosis not present

## 2022-04-04 DIAGNOSIS — R059 Cough, unspecified: Secondary | ICD-10-CM | POA: Diagnosis not present

## 2022-04-04 DIAGNOSIS — J9811 Atelectasis: Secondary | ICD-10-CM | POA: Diagnosis not present

## 2022-04-04 DIAGNOSIS — J449 Chronic obstructive pulmonary disease, unspecified: Secondary | ICD-10-CM | POA: Diagnosis not present

## 2022-04-04 DIAGNOSIS — J90 Pleural effusion, not elsewhere classified: Secondary | ICD-10-CM | POA: Diagnosis not present

## 2022-04-05 ENCOUNTER — Other Ambulatory Visit: Payer: PPO

## 2022-04-05 ENCOUNTER — Telehealth: Payer: Self-pay

## 2022-04-05 ENCOUNTER — Ambulatory Visit: Payer: PPO | Admitting: Hematology and Oncology

## 2022-04-05 ENCOUNTER — Other Ambulatory Visit (HOSPITAL_COMMUNITY): Payer: Self-pay

## 2022-04-05 NOTE — Telephone Encounter (Signed)
Pt called in to see pcp has seen her chest x-ray results, and would like to discuss them please.  Cb#: [phone]

## 2022-04-06 ENCOUNTER — Other Ambulatory Visit: Payer: Self-pay | Admitting: Family Medicine

## 2022-04-06 ENCOUNTER — Other Ambulatory Visit: Payer: PPO

## 2022-04-06 ENCOUNTER — Ambulatory Visit: Payer: PPO | Admitting: Hematology and Oncology

## 2022-04-06 NOTE — Telephone Encounter (Signed)
  Notes to clinic:  Not on current med list but it is not delegated so I can not discontinue it. It was given by a neurologist in the hospital.      Requested Prescriptions  Pending Prescriptions Disp Refills   dexamethasone (DECADRON) 4 MG tablet [Pharmacy Med Name: Dexamethasone 4mg  Tablet] 30 tablet 11    Sig: Take 1 tablet by mouth every day     Not Delegated - Endocrinology:  Oral Corticosteroids Failed - 04/06/2022  5:20 PM      Failed - This refill cannot be delegated      Failed - Manual Review: Eye exam for IOP if prolonged treatment      Failed - Glucose (serum) in normal range and within 180 days    Glucose, Bld  Date Value Ref Range Status  04/02/2022 136 (H) 70 - 99 mg/dL Final    Comment:    Glucose reference range applies only to samples taken after fasting for at least 8 hours.   Glucose-Capillary  Date Value Ref Range Status  03/03/2019 110 (H) 70 - 99 mg/dL Final         Failed - Valid encounter within last 6 months    Recent Outpatient Visits           7 months ago Right ear pain   [place] Eulogio Bear, NP   7 months ago COPD exacerbation ([place])   [place] Eulogio Bear, NP   1 year ago [place] [name], MD   1 year ago Right hip pain   [place] [name], MD   2 years ago [place], Matthias Hughs, FNP              Failed - Bone Mineral Density or Dexa Scan completed in the last 2 years      21 - K in normal range and within 180 days    Potassium  Date Value Ref Range Status  04/02/2022 4.2 3.5 - 5.1 mmol/L Final         Passed - Na in normal range and within 180 days    Sodium  Date Value Ref Range Status  04/02/2022 139 135 - 145 mmol/L Final         Passed - Last BP in normal range    BP Readings from Last 1 Encounters:  04/02/22 114/75

## 2022-04-06 NOTE — Telephone Encounter (Signed)
Spoke with pt, pt is aware of results.

## 2022-04-09 NOTE — Progress Notes (Signed)
Chronic Care Management Pharmacy Note  04/13/2022 Name:  [name] MRN:  [id number] DOB:  12-31-1940  Summary: Follow up with PharmD.  No changes patient continues tolerate all treatments.  She has moved back to [place] from [place] and is much happier with this living situation.  Recommendations/Changes made from today's visit: NONE  Plan: 12 month fu HC FU 6 months   Subjective: [name] is an 81 y.o. year old female who is a primary patient of Pickard, Cammie Mcgee, MD.  The CCM team was consulted for assistance with disease management and care coordination needs.    Engaged with patient by telephone for follow up visit in response to provider referral for pharmacy case management and/or care coordination services.   Consent to Services:  The patient was given the following information about Chronic Care Management services today, agreed to services, and gave verbal consent: 1. CCM service includes personalized support from designated clinical staff supervised by the primary care provider, including individualized plan of care and coordination with other care providers 2. 24/7 contact phone numbers for assistance for urgent and routine care needs. 3. Service will only be billed when office clinical staff spend 20 minutes or more in a month to coordinate care. 4. Only one practitioner may furnish and bill the service in a calendar month. 5.The patient may stop CCM services at any time (effective at the end of the month) by phone call to the office staff. 6. The patient will be responsible for cost sharing (co-pay) of up to 20% of the service fee (after annual deductible is met). Patient agreed to services and consent obtained.  Patient Care Team: [name], MD as PCP - General (Family Medicine) Herminio Commons, MD (Inactive) as PCP - Cardiology (Cardiology) Danie Binder, MD (Inactive) (Gastroenterology) Nicanor Alcon, MD (Thoracic Surgery) Everardo All, MD (Hematology and Oncology) Edythe Clarity, [facility] as Pharmacist (Pharmacist)  Recent office visits:  None noted.    Recent consult visits:  None noted.    Hospital visits:  None in previous 6 months     Objective:  Lab Results  Component Value Date   CREATININE 0.53 04/02/2022   BUN 21 04/02/2022   GFR 107.85 02/16/2016   GFRNONAA >60 04/02/2022   GFRAA >60 06/20/2020   NA 139 04/02/2022   K 4.2 04/02/2022   CALCIUM 9.3 04/02/2022   CO2 33 (H) 04/02/2022   GLUCOSE 136 (H) 04/02/2022    Lab Results  Component Value Date/Time   HGBA1C 5.1 03/08/2019 04:26 AM   GFR 107.85 02/16/2016 11:50 AM    Last diabetic Eye exam: No results found for: "HMDIABEYEEXA"  Last diabetic Foot exam: No results found for: "HMDIABFOOTEX"   Lab Results  Component Value Date   CHOL 220 (H) 03/08/2019   HDL 54 03/08/2019   LDLCALC 155 (H) 03/08/2019   TRIG 57 03/08/2019   CHOLHDL 4.1 03/08/2019       Latest Ref Rng & Units 04/02/2022    2:33 PM 03/08/2022    8:55 AM 07/12/2021   10:36 AM  Hepatic Function  Total Protein 6.5 - 8.1 g/dL 6.5  7.0  6.4   Albumin 3.5 - 5.0 g/dL 4.0  4.3  3.9   AST 15 - 41 U/L _0 ALT 0 - 44 U/L 37  17  12   Alk Phosphatase 38 - 126 U/L 40  47  58   Total Bilirubin  0.3 - 1.2 mg/dL 0.3  0.2  0.3     Lab Results  Component Value Date/Time   TSH 0.535 04/02/2022 02:33 PM   TSH 1.29 03/26/2022 11:46 AM   TSH 2.057 04/17/2021 02:02 PM   TSH 4.09 01/26/2020 04:23 PM   FREET4 0.97 06/01/2015 08:23 AM       Latest Ref Rng & Units 04/02/2022    2:33 PM 03/08/2022    8:55 AM 07/12/2021   10:36 AM  CBC  WBC 4.0 - 10.5 K/uL 6.4  7.1  4.3   Hemoglobin 12.0 - 15.0 g/dL 11.6  11.7  10.9   Hematocrit 36.0 - 46.0 % 37.3  37.9  32.6   Platelets 150 - 400 K/uL 282  258  222     No results found for: "VD25OH"  Clinical ASCVD: No  The ASCVD Risk score (Arnett DK, et al., 2019) failed to calculate for the following reasons:   The 2019 ASCVD  risk score is only valid for ages 52 to 64       04/13/2021   10:52 AM 04/27/2019   10:52 AM 04/30/2018    9:47 AM  Depression screen PHQ 2/9  Decreased Interest 0 0 1  Down, Depressed, Hopeless 0 0 1  PHQ - 2 Score 0 0 2  Altered sleeping   1  Tired, decreased energy   1  Change in appetite   1  Feeling bad or failure about yourself    0  Trouble concentrating   1  Moving slowly or fidgety/restless   0  Suicidal thoughts   0  PHQ-9 Score   6  Difficult doing work/chores   Somewhat difficult     Social History   Tobacco Use  Smoking Status Former   Packs/day: 0.50   Years: 20.00   Total pack years: 10.00   Types: Cigarettes   Quit date: 12/21/2010   Years since quitting: 11.3  Smokeless Tobacco Never  Tobacco Comments   smoking cessation info given and reviewed    BP Readings from Last 3 Encounters:  04/02/22 114/75  03/26/22 94/68  03/08/22 116/65   Pulse Readings from Last 3 Encounters:  04/02/22 79  03/26/22 99  03/08/22 77   Wt Readings from Last 3 Encounters:  04/02/22 95 lb 8 oz (43.3 kg)  03/26/22 92 lb 12.8 oz (42.1 kg)  03/08/22 96 lb 11.2 oz (43.9 kg)   BMI Readings from Last 3 Encounters:  04/02/22 18.04 kg/m  03/26/22 17.53 kg/m  03/08/22 18.89 kg/m    Assessment/Interventions: Review of patient past medical history, allergies, medications, health status, including review of consultants reports, laboratory and other test data, was performed as part of comprehensive evaluation and provision of chronic care management services.   SDOH:  (Social Determinants of Health) assessments and interventions performed: Yes  SDOH Screenings   Alcohol Screen: Low Risk  (04/13/2021)   Alcohol Screen    Last Alcohol Screening Score (AUDIT): 0  Depression (PHQ2-9): Low Risk  (04/13/2021)   Depression (PHQ2-9)    PHQ-2 Score: 0  Financial Resource Strain: Low Risk  (04/13/2021)   Overall Financial Resource Strain (CARDIA)    Difficulty of Paying Living  Expenses: Not hard at all  Food Insecurity: No Food Insecurity (04/13/2021)   Hunger Vital Sign    Worried About Running Out of Food in the Last Year: Never true    Ran Out of Food in the Last Year: Never true  Housing: Low Risk  (  04/13/2021)   Housing    Last Housing Risk Score: 0  Physical Activity: Inactive (04/13/2021)   Exercise Vital Sign    Days of Exercise per Week: 0 days    Minutes of Exercise per Session: 0 min  Social Connections: Socially Isolated (04/13/2021)   Social Connection and Isolation Panel [NHANES]    Frequency of Communication with Friends and Family: More than three times a week    Frequency of Social Gatherings with Friends and Family: More than three times a week    Attends Religious Services: Never    Marine scientist or Organizations: No    Attends Archivist Meetings: Never    Marital Status: Divorced  Stress: No Stress Concern Present (04/13/2021)   [employer] [place] of Stress : Not at all  Tobacco Use: Medium Risk (08/16/2021)   Patient History    Smoking Tobacco Use: Former    Smokeless Tobacco Use: Never    Passive Exposure: Not on file  Transportation Needs: No Transportation Needs (04/13/2021)   PRAPARE - Hydrologist (Medical): No    Lack of Transportation (Non-Medical): No    CCM Care Plan  Allergies  Allergen Reactions   Ciprofloxacin Hcl Hives   Sulfonamide Derivatives Hives and Rash   Ciprofloxacin Hives   Iohexol Other (See Comments)    UNSPECIFIED REACTION  Desc: Per [facility], pt is allergic to IV contrast, no type of reaction was available. Pt cannot remember but first reacted around 15 yrs ago from an IVP. Notes were date from 2009 at urology center., Onset Date: [id number]    Iodinated Contrast Media Rash and Itching   Prozac [Fluoxetine Hcl] Rash    Medications Reviewed Today     Reviewed by Edythe Clarity, [facility]  (Pharmacist) on 04/13/22 at 1149  Med List Status: <None>   Medication Order Taking? Sig Documenting Provider Last Dose Status Informant  albuterol (VENTOLIN HFA) 108 (90 Base) MCG/ACT inhaler [id number] Yes INHALE 2 PUFFS EVERY 6 HOURS AS NEEDED FOR WHEEZING OR SHORTNESS OF BREATH. Orson Slick, MD Taking Active   ALPRAZolam Duanne Moron) 0.5 MG tablet [id number] Yes TAKE (1) TABLET BY MOUTH TWICE A DAY AS NEEDED. [name], MD Taking Active   citalopram (CELEXA) 40 MG tablet [id number] Yes TAKE (1/2) TABLET BY MOUTH AT BEDTIME. [name], MD Taking Active   [facility] ACETAMINOPHEN 325 MG tablet [id number] Yes Take by mouth daily. [provider] Taking Active   denosumab (PROLIA) 60 MG/ML SOSY injection [id number] Yes INJECT INTO UPPER ARM, THIGH, OR ABDOMEN ONCE. [name], MD Taking Active   dexamethasone (DECADRON) 4 MG tablet [id number] Yes Take 1 tablet by mouth every day [name], MD Taking Active   diphenhydrAMINE (BENADRYL) 50 MG tablet [id number]  Take 1 tablet (50 mg total) by mouth once for 1 dose. Take one hour prior to CT scan Derek Jack, MD  Expired 04/13/21 2359   Fluticasone-Umeclidin-Vilant (TRELEGY ELLIPTA) 100-62.5-25 MCG/ACT AEPB [id number] Yes Inhale 1 puff into the lungs daily. [name], MD Taking Active   guaifenesin (HUMIBID E) 400 MG TABS tablet [id number] Yes Take 400 mg by mouth 4 (four) times daily. [provider] Taking Active   levocetirizine (XYZAL) 5 MG tablet [id number] Yes Take 1 tablet by mouth every day [name], MD Taking Active   levothyroxine (SYNTHROID) 50 MCG tablet [id number] Yes Take  1 tablet by mouth before breakfast [name], MD Taking Active   Multiple Vitamin (MULTIVITAMIN WITH MINERALS) TABS tablet [id number] Yes Take 1 tablet by mouth daily. [provider] Taking Active Self  ondansetron (ZOFRAN) 4 MG tablet [id number] Yes TAKE 1 TABLET BY MOUTH 3 TIMES DAILY AS NEEDED FOR  NAUSEA AND VOMITING. [name], MD Taking Active   osimertinib mesylate (TAGRISSO) 80 MG tablet [id number] Yes TAKE 1 TABLET (80 MG TOTAL) BY MOUTH DAILY. Orson Slick, MD Taking Active   OVER THE COUNTER MEDICATION [id number] Yes Bone Support 2-4 tabs daily [provider] Taking Active Self  OXcarbazepine (TRILEPTAL) 150 MG tablet [id number] Yes Take 1 tablet by mouth twice daily [name], MD Taking Active   predniSONE (DELTASONE) 20 MG tablet [id number] Yes Take 2 tablets (40 mg total) by mouth daily with breakfast. [name], MD Taking Active   Med List Note Britt Boozer, [facility] 08/31/20 1015): Tagrisso filled through [employer] [id number] As of 03/13/2019             Patient Active Problem List   Diagnosis Date Noted   Chronic low back pain 07/05/2021   Neck pain 07/05/2021   Knee pain, right 07/05/2021   Right hip pain 07/05/2021   Thoracic spine pain 07/05/2021   Abdominal bloating 01/13/2020   Primary cancer of left lower lobe of lung ([place]) 12/29/2019   Flatulence 11/17/2019   IBS (irritable bowel syndrome)    Acute hypoxemic respiratory failure ([place]) 05/25/2019   Pharyngeal dysphagia 05/07/2019   Brain metastasis 04/17/2019   Brain tumor ([place]) 03/18/2019   Essential hypertension 03/12/2019   Brain metastases 03/06/2019   Neoplasm of brain ([place]) 03/06/2019   Cytotoxic brain edema ([place]) 03/04/2019   ICH (intracerebral hemorrhage) ([place]) - L parietal lobe 03/03/2019   Nontraumatic intracerebral hemorrhage, unspecified ([place]) 03/03/2019   Osteoporosis 05/16/2018   S/P ORIF (open reduction internal fixation) fracture right patella 03/18/18 03/26/2018   Other specified postprocedural states 03/26/2018   Cellulitis, wound, post-operative 03/22/2018   Fracture of right patella 03/18/2018   Normocytic anemia    Esophageal dysphagia 11/27/2017   Heme positive stool 11/27/2017   Nasal crusting 02/06/2017   Iron deficiency anemia  12/05/2016   SIADH (syndrome of inappropriate ADH production) ([place])    Polypharmacy 09/14/2016   Muscle weakness (generalized)    Diastolic dysfunction 78/67/6720   Syncope 09/10/2016   Rib pain on left side    Closed compression fracture of first lumbar vertebra, initial encounter 08/22/2016   Hypersomnia 02/16/2016   Acute bronchitis 02/16/2016   Exertional dyspnea 12/20/2015   HLD (hyperlipidemia) 11/23/2014   Change in bowel habits 11/10/2014   Abdominal pain, left lower quadrant 11/10/2014   Emphysema lung ([place]) 94/70/9628   Helicobacter pylori gastritis 07/30/2014   Spinal cord disease ([place]) 05/19/2014   Acute on chronic respiratory failure with hypoxia ([place]) 04/10/2014   Wrist fracture 04/09/2014   Occlusion and stenosis of carotid artery without mention of cerebral infarction 03/16/2014   T12 burst fracture ([place]) 01/13/2014   Burst fracture of thoracic vertebra ([place]) 12/31/2013   Constipation 12/14/2013   Acute respiratory failure with hypoxia ([place]) 12/14/2013   COPD (chronic obstructive pulmonary disease) ([place]) 12/14/2013   Compression fracture of thoracic vertebra ([place]) 12/14/2013   Hypoxia 12/13/2013   Compression fracture 12/13/2013   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[place]) 08/11/2013   AAA (abdominal aortic aneurysm) without rupture ([place]) 02/26/2012   Dyspepsia 02/11/2012   Aneurysm (  [place]) 02/11/2012   Trigeminal neuralgia    Epigastric pain 02/21/2011   Dysphagia 02/21/2011   Adenocarcinoma of lung ([place]) 25/00/3704   HELICOBACTER PYLORI GASTRITIS, HX OF 12/30/2009   Personal history of other diseases of the digestive system 12/30/2009    Immunization History  Administered Date(s) Administered   Fluad Quad(high Dose 65+) 06/19/2019, 07/20/2020, 07/12/2021   Hepatitis B 02/17/2007, 04/17/2007, 09/12/2007   Influenza, High Dose Seasonal PF 07/11/2018   Influenza,inj,Quad PF,6+ Mos 07/12/2015, 07/12/2016, 06/11/2017   Pneumococcal Conjugate-13 11/27/2019   Pneumococcal  Polysaccharide-23 07/19/2014   Td 02/17/2007   Tdap 04/09/2014   Zoster, Live 01/12/2009    Conditions to be addressed/monitored:  HTN. Adenocarcinoma of lung w/ brain metastases, COPD, HLD.  Care Plan : General Pharmacy (Adult)  Updates made by Edythe Clarity, RPH since 04/13/2022 12:00 AM     Problem: HTN. Adenocarcinoma of lung w/ brain metastases, COPD, HLD   Priority: High  Onset Date: 11/15/2020     Long-Range Goal: Patient-Specific Goal   Start Date: 11/15/2020  Expected End Date: 05/15/2021  Recent Progress: On track  Priority: High  Note:   Current Barriers:  None specific at this time   Pharmacist Clinical Goal(s):  Over the next 120 days, patient will work toward alleviating adverse effects from treatment through collaboration with PharmD and provider.   Interventions: 1:1 collaboration with [name], MD regarding development and update of comprehensive plan of care as evidenced by provider attestation and co-signature Inter-disciplinary care team collaboration (see longitudinal plan of care) Comprehensive medication review performed; medication list updated in electronic medical record  Hypertension (BP goal <140/90) 04/11/22 -controlled, based on office readings -Current treatment: No medications at this time -Medications previously tried: none -Current home readings: none available, denies any symptoms -Educated on Importance of home blood pressure monitoring; Symptoms of hypotension and importance of maintaining adequate hydration; -Continue current management, she reports she is doing well in her current living situation. She can get in touch with her son and he is there quickly if he needs to be. Encouraged her to continue positive mindset and if possible to implement physical activity into her routine.  Update 03/20/21 No changes to meds Denies any dizziness or HA'S  Update 10/06/21 No signs of elevated BP at home. No dizziness or HA's -  limiting herself to one cup of coffee per day. No changes to medications needed at this time. Continue current management.  Hyperlipidemia: (LDL goal < 100) -uncontrolled, not assessed -Current treatment: No medications -Medications previously tried: pravastatin, atorvastatin (unkown why d/c)  -Educated on Cholesterol goals;  -Counseled on diet and exercise extensively recommend to continue current management strategy, would hesitate to recommend statin given her age and other comonbidities   COPD (Goal: control symptoms and prevent exacerbations) -controlled -Current treatment  Trelegy 100-62.5-25 mcg one puff daily Appropriate, Effective, Safe, Accessible Albuterol HFA 90 mcg Appropriate, Effective, Safe, Accessible -Medications previously tried: none noted  -Exacerbations requiring treatment in last 6 months:  none -Patient reports consistent use of maintenance inhaler -Frequency of rescue inhaler use: rarely -Counseled on Proper inhaler technique; She moved back to [place] and is living in a mobile home parked in the back of her son's house.  She is very happy with this arrangement and reports her breathing has been well controlled. She continues to use both inhalers as directed.  Denies any issues with cost at this time. No changes needed, continue to use maintenance inhaler (Trelegy) daily.  Update 03/20/21 Continues  to use Trelegy as maintenance Rarely has to use Albuterol Reports breathing is controlled  Update 10/06/21 She reports her oxygen level is the best it has been in a while.  Continues to use inhalers but not having to use her rescue as much! Does get short of breath occasionally during over exertion. She is now living with her granddaughter in [place], [place] and is very thankful to be able to do so. No changes to meds needed, continue supportive care.  [facility] [place] - no refills needed at this time.  Lung Cancer w/ brain  metastases (Goal: Comfort care, minimize symptoms and adverse effects from treatment) -controlled, not assessed at this time -Current treatment  Tagrisso 35m  -Medications previously tried: none noted  -Counseled on use of O'Keefes working hands cream for patient having extreme dry hands and fingertips as a result of new treatment with Tagrisso  - Lesions on brain have cleared - Recommend continue current medication  Update 03/20/21 Continues to have dry hands and fingers, O'keefes has been somewhat effective States it is tolerable and she continues to be in good spirits  Patient Goals/Self-Care Activities Over the next 120 days, patient will:  - take medications as prescribed Contact providers with any questions or concerns  Follow Up Plan: The care management team will reach out to the patient again over the next 1 year.                  Compliance/Adherence/Medication fill history: Care Gaps: Covid-19 vaccine  Star-Rating Drugs: None  Medication Assistance: None required.  Patient affirms current coverage meets needs.   Patient's [place] pharmacy [place]:   [facility] [place]Phone: [phone]: [phone]  [place]Phone: [phone]: [phone]  Uses pill box? Yes Pt endorses 100% compliance   We discussed: Benefits of medication synchronization, packaging and delivery as well as enhanced pharmacist oversight with [facility]. Patient decided to: Continue current medication management strategy - Has been with [facility] for years   Follow Up:  Patient agrees to Care Plan and Follow-up.   Plan: The patient has been provided with contact information for the care management team and has been advised to call with any health related questions or  concerns.    CBeverly Milch PharmD Clinical Pharmacist [place]([phone]

## 2022-04-12 ENCOUNTER — Ambulatory Visit (INDEPENDENT_AMBULATORY_CARE_PROVIDER_SITE_OTHER): Payer: PPO | Admitting: Pharmacist

## 2022-04-12 DIAGNOSIS — H524 Presbyopia: Secondary | ICD-10-CM | POA: Diagnosis not present

## 2022-04-12 DIAGNOSIS — Z961 Presence of intraocular lens: Secondary | ICD-10-CM | POA: Diagnosis not present

## 2022-04-12 DIAGNOSIS — I10 Essential (primary) hypertension: Secondary | ICD-10-CM

## 2022-04-12 DIAGNOSIS — J449 Chronic obstructive pulmonary disease, unspecified: Secondary | ICD-10-CM

## 2022-04-12 DIAGNOSIS — H5213 Myopia, bilateral: Secondary | ICD-10-CM | POA: Diagnosis not present

## 2022-04-12 NOTE — Telephone Encounter (Signed)
Patient is calling to check  the status of this refill. She states divvydose [phone] is trying to set up her medication in pill pack for the month and needs this refill.  Last OV 03/26/22  CB# [phone]

## 2022-04-13 NOTE — Patient Instructions (Addendum)
Visit Information   Goals Addressed             This Visit's Progress    Manage Fatigue (Tiredness- Cancer Treatment)   On track    Follow Up Date 10/06/21   - eat healthy - get outdoors every day (weather permitting) - use devices that will help like a cane, sock-puller or reacher    Why is this important?   Cancer treatment and its side effects can drain your energy. It can keep you from doing things you would like to do.  There are many things that you can do to manage fatigue.    Notes       Patient Care Plan: General Pharmacy (Adult)     Problem Identified: HTN. Adenocarcinoma of lung w/ brain metastases, COPD, HLD   Priority: High  Onset Date: 11/15/2020     Long-Range Goal: Patient-Specific Goal   Start Date: 11/15/2020  Expected End Date: 05/15/2021  Recent Progress: On track  Priority: High  Note:   Current Barriers:  None specific at this time   Pharmacist Clinical Goal(s):  Over the next 120 days, patient will work toward alleviating adverse effects from treatment through collaboration with PharmD and provider.   Interventions: 1:1 collaboration with [name], MD regarding development and update of comprehensive plan of care as evidenced by provider attestation and co-signature Inter-disciplinary care team collaboration (see longitudinal plan of care) Comprehensive medication review performed; medication list updated in electronic medical record  Hypertension (BP goal <140/90) 04/11/22 -controlled, based on office readings -Current treatment: No medications at this time -Medications previously tried: none -Current home readings: none available, denies any symptoms -Educated on Importance of home blood pressure monitoring; Symptoms of hypotension and importance of maintaining adequate hydration; -Continue current management, she reports she is doing well in her current living situation. She can get in touch with her son and he is there quickly  if he needs to be. Encouraged her to continue positive mindset and if possible to implement physical activity into her routine.  Update 03/20/21 No changes to meds Denies any dizziness or HA'S  Update 10/06/21 No signs of elevated BP at home. No dizziness or HA's - limiting herself to one cup of coffee per day. No changes to medications needed at this time. Continue current management.  Hyperlipidemia: (LDL goal < 100) -uncontrolled, not assessed -Current treatment: No medications -Medications previously tried: pravastatin, atorvastatin (unkown why d/c)  -Educated on Cholesterol goals;  -Counseled on diet and exercise extensively recommend to continue current management strategy, would hesitate to recommend statin given her age and other comonbidities   COPD (Goal: control symptoms and prevent exacerbations) -controlled -Current treatment  Trelegy 100-62.5-25 mcg one puff daily Appropriate, Effective, Safe, Accessible Albuterol HFA 90 mcg Appropriate, Effective, Safe, Accessible -Medications previously tried: none noted  -Exacerbations requiring treatment in last 6 months:  none -Patient reports consistent use of maintenance inhaler -Frequency of rescue inhaler use: rarely -Counseled on Proper inhaler technique; She moved back to [place] and is living in a mobile home parked in the back of her son's house.  She is very happy with this arrangement and reports her breathing has been well controlled. She continues to use both inhalers as directed.  Denies any issues with cost at this time. No changes needed, continue to use maintenance inhaler (Trelegy) daily.  Update 03/20/21 Continues to use Trelegy as maintenance Rarely has to use Albuterol Reports breathing is controlled  Update 10/06/21 She reports her  oxygen level is the best it has been in a while.  Continues to use inhalers but not having to use her rescue as much! Does get short of breath occasionally during  over exertion. She is now living with her granddaughter in [place], [place] and is very thankful to be able to do so. No changes to meds needed, continue supportive care.  Switched pharmacies to [facility] in [place] - no refills needed at this time.  Lung Cancer w/ brain metastases (Goal: Comfort care, minimize symptoms and adverse effects from treatment) -controlled, not assessed at this time -Current treatment  Tagrisso 80mg   -Medications previously tried: none noted  -Counseled on use of O'Keefes working hands cream for patient having extreme dry hands and fingertips as a result of new treatment with Tagrisso  - Lesions on brain have cleared - Recommend continue current medication  Update 03/20/21 Continues to have dry hands and fingers, O'keefes has been somewhat effective States it is tolerable and she continues to be in good spirits  Patient Goals/Self-Care Activities Over the next 120 days, patient will:  - take medications as prescribed Contact providers with any questions or concerns  Follow Up Plan: The care management team will reach out to the patient again over the next 1 year.              The patient verbalized understanding of instructions, educational materials, and care plan provided today and DECLINED offer to receive copy of patient instructions, educational materials, and care plan.  Telephone follow up appointment with pharmacy team member scheduled for: 1 year  Edythe Clarity, [place], PharmD, [place] Clinical Pharmacist Practitioner [place] [phone]

## 2022-04-16 ENCOUNTER — Other Ambulatory Visit (HOSPITAL_COMMUNITY): Payer: Self-pay

## 2022-04-25 IMAGING — CT CT CHEST W/ CM
2 of 4 series · 15 of 36 positions shown, 18 images · IV contrast (Omnipaque or Isovue)
Comparison: 03/10/2020

CLINICAL DATA: History of pulmonary neoplasm, adenocarcinoma
diagnosed in 4004 with metastatic disease to the brain. Follow-up
examination recent CT displayed signs of worsening of lower lobe
nodules.

EXAM:
CT CHEST WITH CONTRAST
TECHNIQUE: Multidetector CT imaging of the chest was performed during
intravenous contrast administration.
CONTRAST:  75mL OMNIPAQUE IOHEXOL 300 MG/ML  SOLN

[Series 5: coronal · coronal · 0.62mm/px · 3 of 134 slices shown]
[im 27/134  lung]
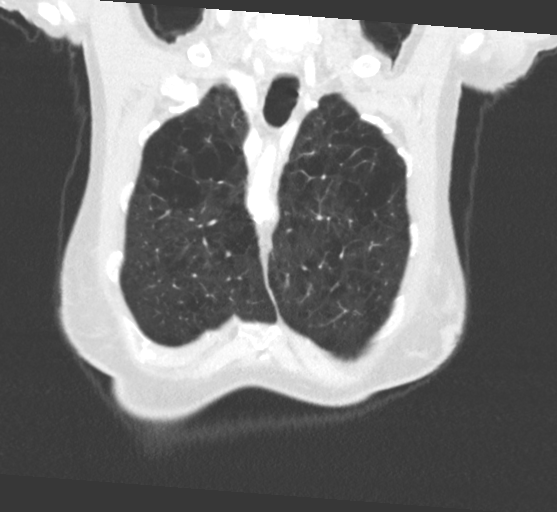
[im 54/134  lung]
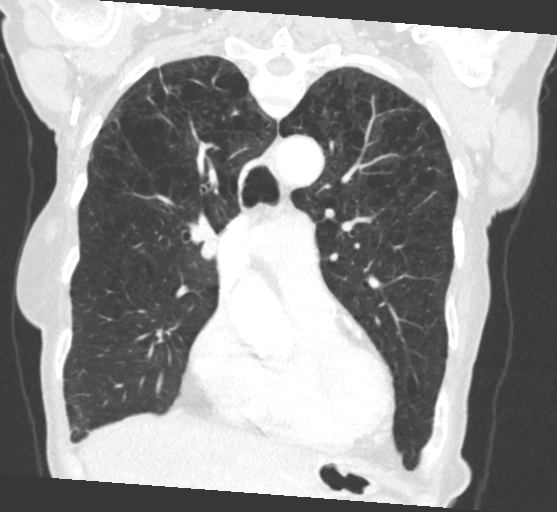
[im 80/134  lung]
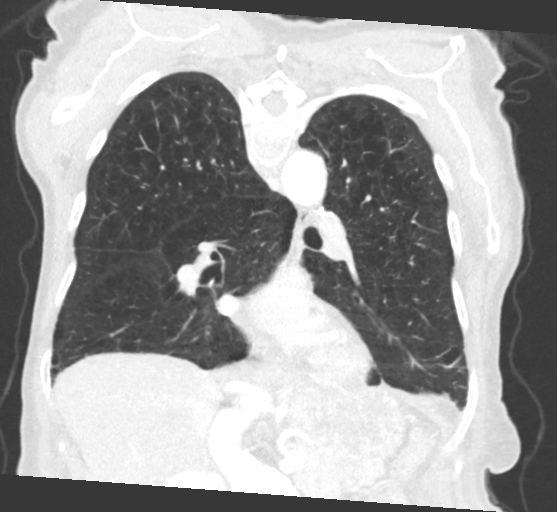

[Series 8: super (person_name) (person_name) · axial · 0.62mm/px · z∈[+1305,+1559]mm · 12 of 356 slices shown, 15 images]
[im 19/356  mediastinal]
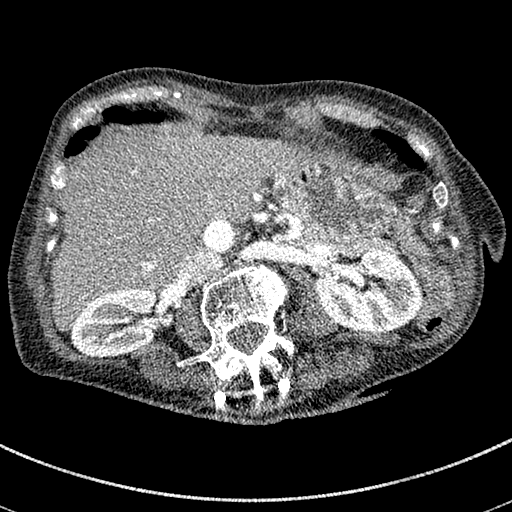
[im 19/356  lung]
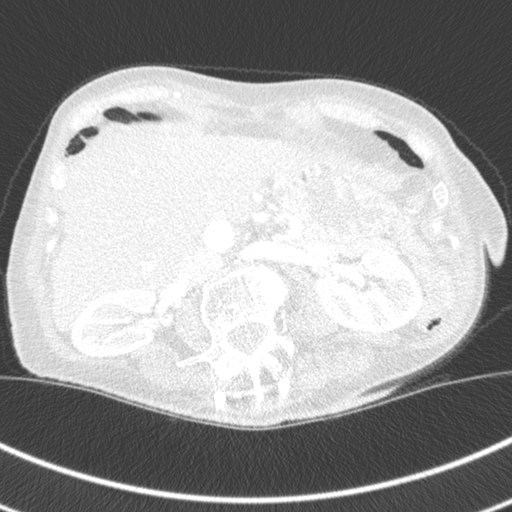
[im 57/356  lung]
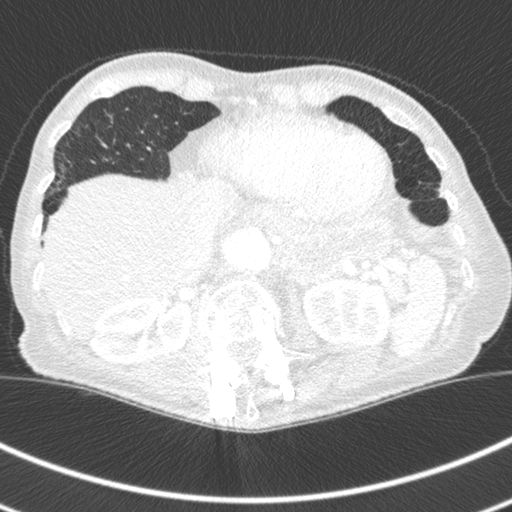
[im 75/356  lung]
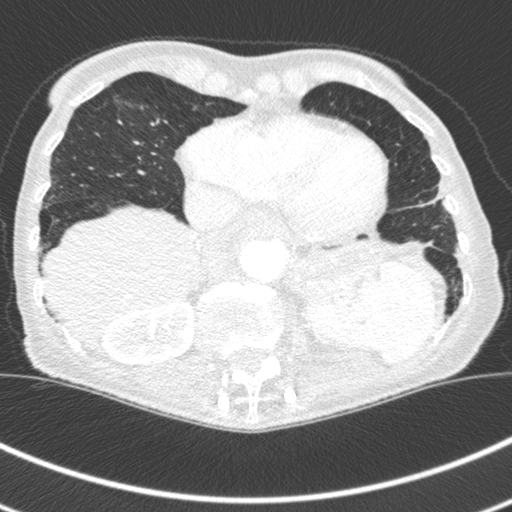
[im 113/356  lung]
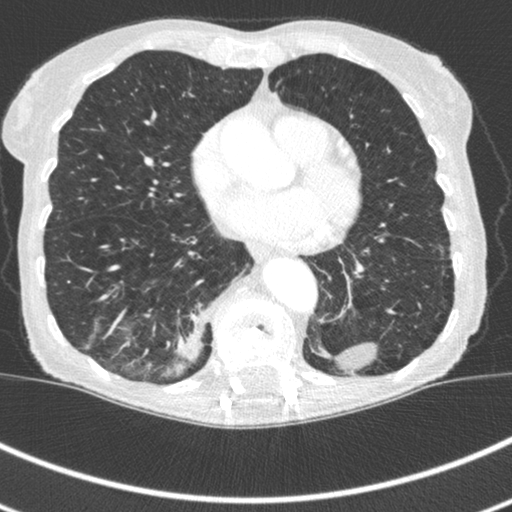
[im 131/356  mediastinal]
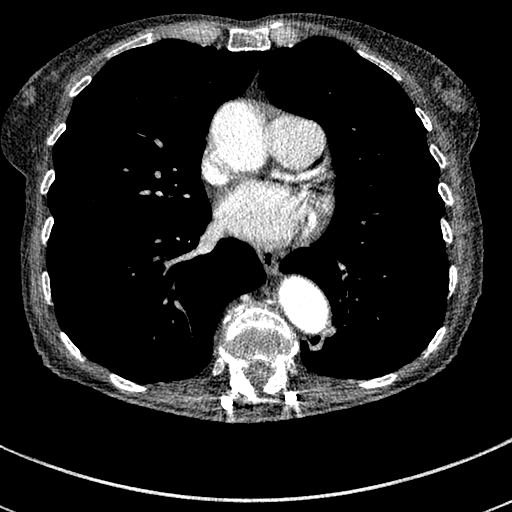
[im 131/356  lung]
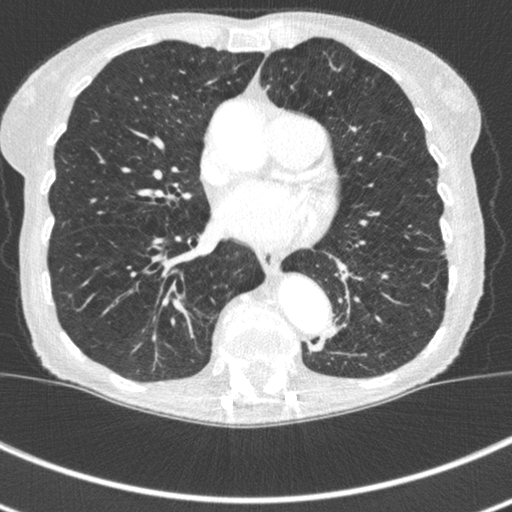
[im 169/356  lung]
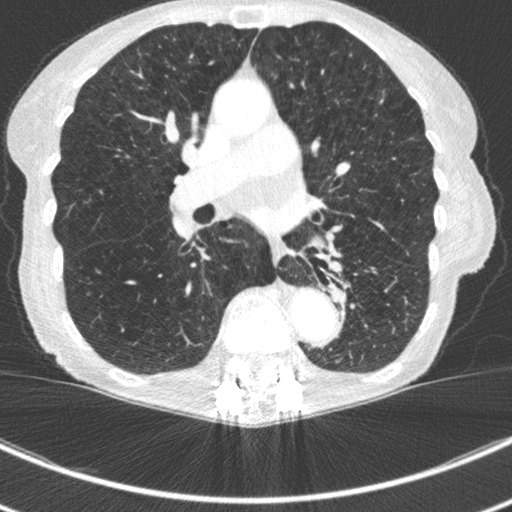
[im 187/356  lung]
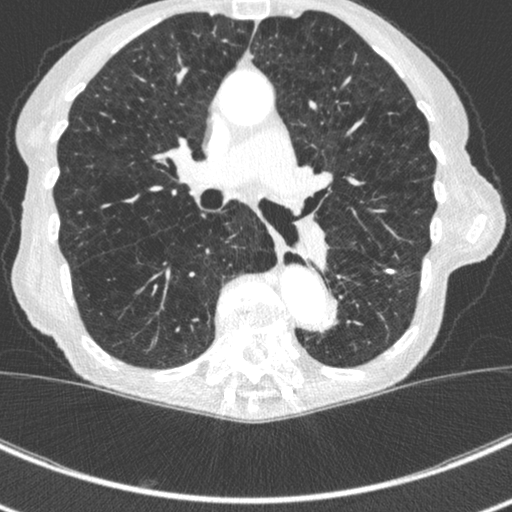
[im 225/356  lung]
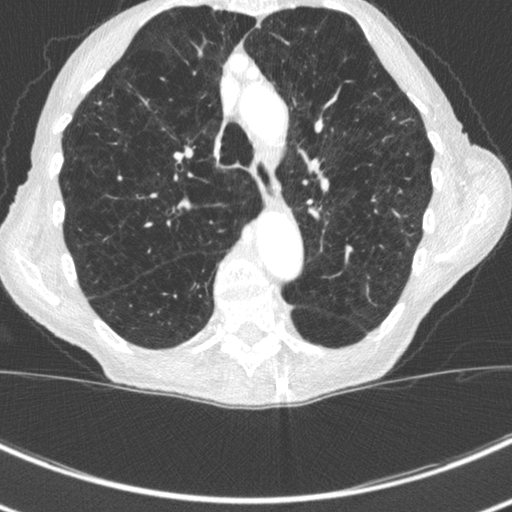
[im 243/356  mediastinal]
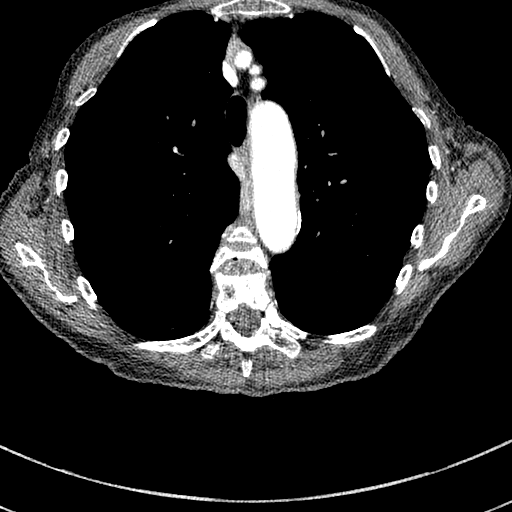
[im 243/356  lung]
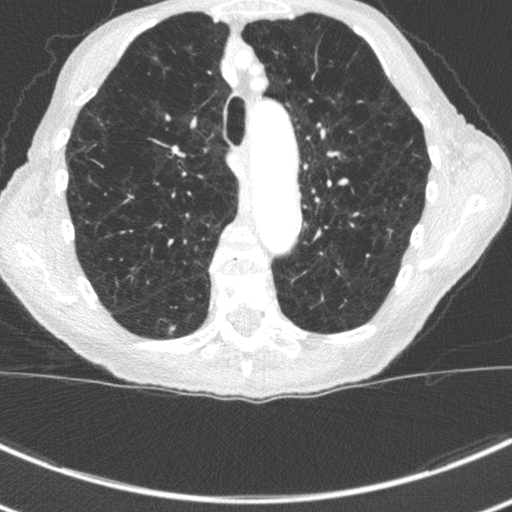
[im 281/356  lung]
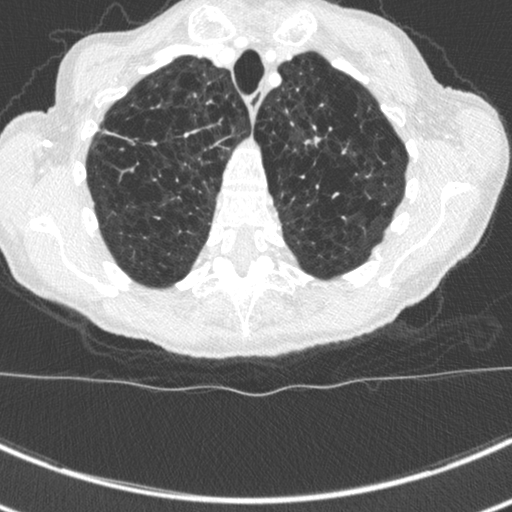
[im 299/356  lung]
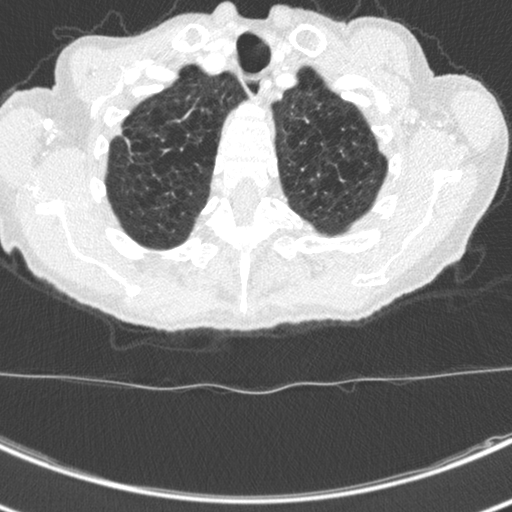
[im 337/356  lung]
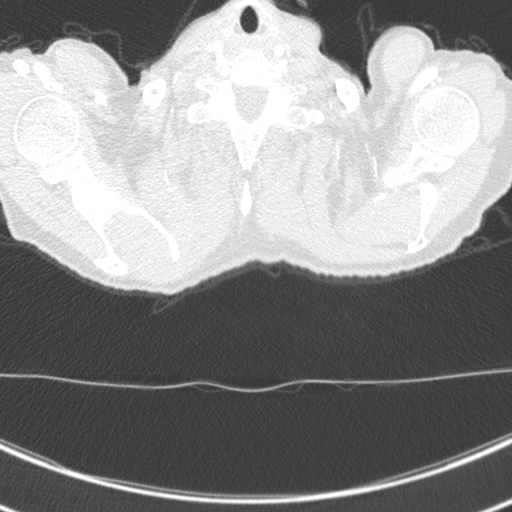

[15 of 36 positions shown; findings below may reference images not displayed]

FINDINGS: Cardiovascular: Calcified and noncalcified plaque of the thoracic
aorta without significant dilation. Normal heart size. Tortuosity of
the descending thoracic aorta with similar appearance. No
pericardial effusion.

Mediastinum/Nodes: Thoracic inlet structures are normal. No axillary
lymphadenopathy. No mediastinal lymphadenopathy. Esophagus mildly
patulous similar to the previous study.

Lungs/Pleura: Severe pulmonary emphysema. LEFT lower lobe pulmonary
nodule below the hilum (image 85, series [DATE] x 6 mm unchanged since
recent imaging. No consolidation. No pleural effusion. Scarring in
the LEFT lower lobe with signs of previous wedge resection along the
fissure in the LEFT chest.

3 mm nodule in the medial LEFT lower lobe shows no change on image
90 of series 4.

Bandlike scarring in the RIGHT upper lobe.

Tree-in-bud opacities in the RIGHT upper lobe along the minor
fissure. No sign of consolidation or evidence of pleural effusion.

Upper Abdomen: Incidental imaging of upper abdominal contents
without acute process. The adrenal glands are normal.

Dilation of the infrarenal abdominal aorta is only partially
visualized dilated to perhaps as much as 3.2 cm

Musculoskeletal: Multilevel thoracic spinal compression deformities
and signs of prior spinal fusion show no change.
IMPRESSION: 1. Stable LEFT lower lobe pulmonary nodules.
2. Tree-in-bud opacities in the RIGHT upper lobe along the minor
fissure, may represent an infectious or inflammatory process.
Attention on follow-up studies, correlate with any respiratory
symptoms
3. Severe pulmonary emphysema.
4. Dilation of the infrarenal abdominal aorta is only partially
visualized to perhaps as much as 3.2 cm. See previous CT from [DATE]. Emphysema and aortic atherosclerosis.

Aortic Atherosclerosis (NIR5E-VYU.U) and Emphysema ([id number].N).

## 2022-04-26 ENCOUNTER — Encounter: Payer: Self-pay | Admitting: Family Medicine

## 2022-04-26 ENCOUNTER — Other Ambulatory Visit: Payer: Self-pay | Admitting: Radiation Therapy

## 2022-04-27 NOTE — Telephone Encounter (Signed)
Please call pt to make an appt to discuss her med w/Dr. Dennard Schaumann per pt's Mychart msg/request

## 2022-04-30 ENCOUNTER — Encounter: Payer: Self-pay | Admitting: Family Medicine

## 2022-05-01 ENCOUNTER — Inpatient Hospital Stay: Payer: PPO | Admitting: Physician Assistant

## 2022-05-01 ENCOUNTER — Inpatient Hospital Stay: Payer: PPO

## 2022-05-04 ENCOUNTER — Other Ambulatory Visit (HOSPITAL_COMMUNITY): Payer: Self-pay

## 2022-05-07 DIAGNOSIS — I10 Essential (primary) hypertension: Secondary | ICD-10-CM | POA: Diagnosis not present

## 2022-05-07 DIAGNOSIS — J449 Chronic obstructive pulmonary disease, unspecified: Secondary | ICD-10-CM

## 2022-05-08 ENCOUNTER — Ambulatory Visit (INDEPENDENT_AMBULATORY_CARE_PROVIDER_SITE_OTHER): Payer: PPO | Admitting: Family Medicine

## 2022-05-08 VITALS — Ht 61.0 in | Wt 93.0 lb

## 2022-05-08 DIAGNOSIS — J441 Chronic obstructive pulmonary disease with (acute) exacerbation: Secondary | ICD-10-CM

## 2022-05-08 MED ORDER — PREDNISONE 20 MG PO TABS: 40.0000 mg | ORAL_TABLET | Freq: Every day | ORAL | 0 refills | Status: DC

## 2022-05-08 MED ORDER — IPRATROPIUM-ALBUTEROL 0.5-2.5 (3) MG/3ML IN SOLN: 3.0000 mL | Freq: Four times a day (QID) | RESPIRATORY_TRACT | 1 refills | Status: AC | PRN

## 2022-05-08 MED ORDER — AMOXICILLIN-POT CLAVULANATE 875-125 MG PO TABS: 1.0000 | ORAL_TABLET | Freq: Two times a day (BID) | ORAL | 0 refills | Status: DC

## 2022-05-08 MED ORDER — AZITHROMYCIN 250 MG PO TABS: ORAL_TABLET | ORAL | 0 refills | Status: DC

## 2022-05-08 MED ORDER — DIAZEPAM 2 MG PO TABS: 2.0000 mg | ORAL_TABLET | Freq: Four times a day (QID) | ORAL | 0 refills | Status: DC | PRN

## 2022-05-08 NOTE — Progress Notes (Signed)
Subjective:    Patient ID: [name], female    DOB: [birth year], 81 y.o.   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  She was originally diagnosed in 2012.  She underwent left lower lobe resection for well differentiated stage Ib bronchoalveolar cancer.  Margins were negative.  In 2020 she presented to the emergency room with aphasia.  MRI revealed multiple brain mets.  She underwent stereotactic left parietal craniotomy for resection of tumor in June 2020.  July 2021, MRI of the brain revealed two new metastases in the right cerebellum and right frontal lobe.  She was treated with stereotactic radiation therapy.  She has been managed with osimiritinib by oncology since 2021.  She recently returned to the area in June after having moved to [place] last year.  She presents today stating that she does not feel well.  She is normally on 2 L via nasal cannula.  On arrival pulse oximetry was 72% on 3 L.  The patient has markedly diminished breath sounds bilaterally.  He has rhonchorous breath sounds all throughout the right lung with decreased airflow and faint expiratory wheezing.  She has left basilar crackles in the left lung and also diminished breath sounds throughout.  Patient was given a DuoNeb immediately and put on 4 L via nasal cannula.  Pulse oximetry improved to 91% on 4 L.  Initial DuoNeb was started at 1140. After first breathing treatment, at 1151, pulse oximetry was up to 100% on 4 L.  Therefore the patient was weaned back down to 2 L.  She was given 80 mg of Depo-Medrol IM x1 at 11:51.  She is clearly having a COPD exacerbation.  After the first breathing treatment, her breath sounds improved.  She has rhonchi to all throughout the right lung with expiratory wheezes and diminished breath sounds.  She continues to have persistent crackles in the left lower lung but this is the site of her previous lung resection.   She also has expiratory wheezes and rhonchi in the left upper lobe. 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[place])    corrected by surgery 08/21/13   Anxiety disorder    Aortic aneurysm ([place])    Brain metastasis ([place])    lung cancer s/p resection 7/20   Cancer ([place])    Phreesia 36/62/9476   Complication of anesthesia 2011   bloodpressure dropped during colonoscopy, not problems since   Compression fracture    COPD (chronic obstructive pulmonary disease) (H[facility])    Depression    Diastolic dysfunction    Diverticulosis    Dysphagia    Emphysema lung ([place]) 11/08/2014   Headache    Hiatal hernia    History of kidney stones    Hypothyroidism    IBS (irritable bowel syndrome)    Iron deficiency anemia due to chronic blood loss 12/05/2016   On home O2 12/15/2013   chronic hypoxia   Pneumonia    PUD (peptic ulcer disease)    75yrs   Shortness of breath    with exertion   SIADH (syndrome of inappropriate ADH production) ([place])    Trigeminal neuralgia    Past Surgical History:  Procedure Laterality Date   ABDOMINAL HYSTERECTOMY     APPLICATION OF CRANIAL NAVIGATION N/A 03/18/2019   Procedure: APPLICATION  OF CRANIAL NAVIGATION;  Surgeon: Consuella Lose, MD;  Location: [facility];  Service: Neurosurgery;  Laterality: N/A;   BACK SURGERY  January 13, 2014   BRAIN SURGERY N/A    Phreesia 03/28/2020   CATARACT EXTRACTION     CATARACT EXTRACTION W/PHACO  09/02/2012   Procedure: CATARACT EXTRACTION PHACO AND INTRAOCULAR LENS PLACEMENT (IOC);  Surgeon: Elta Guadeloupe T. 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 Oneida Alar: moderate diverticulosis, hemorrhoids, redundant colon. next TCS in 10-15 years.    CRANIOTOMY Left  03/18/2019   Procedure: Left stereotactic craniotomy for tumor resection;  Surgeon: Consuella Lose, MD;  Location: [facility];  Service: Neurosurgery;  Laterality: Left;  Left stereotactic craniotomy for tumor resection   ENDARTERECTOMY Right 08/21/2013   Procedure: RIGHT CAROTID ANEURYSM RESECTION;  Surgeon: Rosetta Posner, MD;  [facility];  Service: Vascular;  Laterality: Right;   ESOPHAGEAL DILATION  02/24/2018   Procedure: ESOPHAGEAL DILATION;  Surgeon: Danie Binder, MD;  [facility];  Service: Endoscopy;;   ESOPHAGOGASTRODUODENOSCOPY  11/13/2009   w/dilation to 35mm, gastric ulceration (H.Pylori) s/p treatment   ESOPHAGOGASTRODUODENOSCOPY  11/21/2009   distal esophageal web, gastritis   ESOPHAGOGASTRODUODENOSCOPY  03/14/12   [facility]:ERDEYCXKG in the distal esophagus/Mild gastritis/small HH. + H.pylori, prescribed Pylera. Finished treatment.    ESOPHAGOGASTRODUODENOSCOPY N/A 12/13/2017   Dr. Oneida Alar: esophageal web s/p dilation, gastritis, no h.pylori   ESOPHAGOGASTRODUODENOSCOPY N/A 02/24/2018   Dr. Oneida Alar: web in prox esophagus and in distal esophagus s/p dilation, mild gastritis, mild pyloric stenosis, mild post ulcer duodenal deformity at D1/D2   fatty tumor removal from lt groin     FRACTURE SURGERY Left    hand and left ring finger   GIVENS CAPSULE STUDY N/A 12/26/2017   incomplete   GIVENS CAPSULE STUDY N/A 02/24/2018   normal small bowel   KNEE SURGERY     patella tendon repair june 2019 [facility]   LUNG CANCER SURGERY  12/2010   Left VATS, minithoracotomy, LLL superior segmentectomy   ORIF PATELLA Right 03/18/2018   Procedure: OPEN REDUCTION INTERNAL (ORIF) FIXATION RIGHT PATELLA;  Surgeon: Carole Civil, MD;  [facility];  Service: Orthopedics;  Laterality: Right;   ORIF WRIST FRACTURE Left 04/09/2014   Procedure: OPEN REDUCTION INTERNAL FIXATION (ORIF) WRIST FRACTURE;  Surgeon: Renette Butters, MD;  Location: [facility];  Service: Orthopedics;  Laterality: Left;    PERCUTANEOUS PINNING Left 04/09/2014   Procedure: PERCUTANEOUS PINNING EXTREMITY;  Surgeon: Renette Butters, MD;  Location: [facility];  Service: Orthopedics;  Laterality: Left;   SAVORY DILATION N/A 12/13/2017   Procedure: SAVORY DILATION;  Surgeon: Danie Binder, MD;  [facility];  Service: Endoscopy;  Laterality: N/A;   TUBAL LIGATION     vocal cord surgery  02/06/2011   laryngoscopy with bilateral vocal cord Radiesse injection for vocal cord paralysis   YAG LASER APPLICATION Left 81/85/6314   Procedure: YAG LASER APPLICATION;  Surgeon: Rutherford Guys, MD;  [facility];  Service: Ophthalmology;  Laterality: Left;   Current Outpatient Medications on File Prior to Visit  Medication Sig Dispense Refill   albuterol (VENTOLIN HFA) 108 (90 Base) MCG/ACT inhaler INHALE 2 PUFFS EVERY 6 HOURS AS NEEDED FOR WHEEZING OR SHORTNESS OF BREATH. 8.5 g 0   ALPRAZolam (XANAX) 0.5 MG tablet  TAKE (1) TABLET BY MOUTH TWICE A DAY AS NEEDED. 60 tablet 0   citalopram (CELEXA) 40 MG tablet TAKE (1/2) TABLET BY MOUTH AT BEDTIME. 15 tablet 5   [facility] ACETAMINOPHEN 325 MG tablet Take by mouth daily.     denosumab (PROLIA) 60 MG/ML SOSY injection INJECT INTO UPPER ARM, THIGH, OR ABDOMEN ONCE. 1 mL 0   dexamethasone (DECADRON) 4 MG tablet Take 1 tablet by mouth every day 30 tablet 11   Fluticasone-Umeclidin-Vilant (TRELEGY ELLIPTA) 100-62.5-25 MCG/ACT AEPB Inhale 1 puff into the lungs daily. 3 each 3   guaifenesin (HUMIBID E) 400 MG TABS tablet Take 400 mg by mouth 4 (four) times daily.     levocetirizine (XYZAL) 5 MG tablet Take 1 tablet by mouth every day 30 tablet 11   levothyroxine (SYNTHROID) 50 MCG tablet Take 1 tablet by mouth before breakfast 30 tablet 11   Multiple Vitamin (MULTIVITAMIN WITH MINERALS) TABS tablet Take 1 tablet by mouth daily.     ondansetron (ZOFRAN) 4 MG tablet TAKE 1 TABLET BY MOUTH 3 TIMES DAILY AS NEEDED FOR NAUSEA AND VOMITING. 90 tablet 0   osimertinib mesylate (TAGRISSO) 80 MG  tablet TAKE 1 TABLET (80 MG TOTAL) BY MOUTH DAILY. 30 tablet 2   OVER THE COUNTER MEDICATION Bone Support 2-4 tabs daily     OXcarbazepine (TRILEPTAL) 150 MG tablet Take 1 tablet by mouth twice daily 60 tablet 11   predniSONE (DELTASONE) 20 MG tablet Take 2 tablets (40 mg total) by mouth daily with breakfast. 14 tablet 0   diphenhydrAMINE (BENADRYL) 50 MG tablet Take 1 tablet (50 mg total) by mouth once for 1 dose. Take one hour prior to CT scan 1 tablet 0   No current facility-administered medications on file prior to visit.   Allergies  Allergen Reactions   Ciprofloxacin Hcl Hives   Sulfonamide Derivatives Hives and Rash   Ciprofloxacin Hives   Iohexol Other (See Comments)    UNSPECIFIED REACTION  Desc: Per [facility], pt is allergic to IV contrast, no type of reaction was available. Pt cannot remember but first reacted around 15 yrs ago from an IVP. Notes were date from 2009 at urology center., [place] Date: [id number]    Iodinated Contrast Media Rash and Itching   Prozac [Fluoxetine Hcl] Rash   Social History   Socioeconomic History   Marital status: Divorced    Spouse name: Not on file   Number of children: Not on file   Years of education: Not on file   Highest education level: Not on file  Occupational History   Not on file  Tobacco Use   Smoking status: Former    Packs/day: 0.50    Years: 20.00    Total pack years: 10.00    Types: Cigarettes    Quit date: 12/21/2010    Years since quitting: 11.3   Smokeless tobacco: Never   Tobacco comments:    smoking cessation info given and reviewed   Vaping Use   Vaping Use: Never used  Substance and Sexual Activity   Alcohol use: Not Currently    Alcohol/week: 0.0 standard drinks of alcohol   Drug use: No   Sexual activity: Yes    Birth control/protection: Surgical  Other Topics Concern   Not on file  Social History Narrative   Divorced since 38.Lives alone with a dog.Marland KitchenRetired.  She receives Meals on Wheels.    Social Determinants of Health   Financial Resource Strain: Low Risk  (04/13/2021)   Overall  Financial Resource Strain (CARDIA)    Difficulty of Paying Living Expenses: Not hard at all  Food Insecurity: No Food Insecurity (04/13/2021)   Hunger Vital Sign    Worried About Running Out of Food in the Last Year: Never true    Ran Out of Food in the Last Year: Never true  Transportation Needs: No Transportation Needs (04/13/2021)   PRAPARE - Hydrologist (Medical): No    Lack of Transportation (Non-Medical): No  Physical Activity: Inactive (04/13/2021)   Exercise Vital Sign    Days of Exercise per Week: 0 days    Minutes of Exercise per Session: 0 min  Stress: No Stress Concern Present (04/13/2021)   [place]    Feeling of Stress : Not at all  Social Connections: Socially Isolated (04/13/2021)   Social Connection and Isolation Panel [NHANES]    Frequency of Communication with Friends and Family: More than three times a week    Frequency of Social Gatherings with Friends and Family: More than three times a week    Attends Religious Services: Never    Marine scientist or Organizations: No    Attends Archivist Meetings: Never    Marital Status: Divorced  Human resources officer Violence: Not At Risk (04/13/2021)   Humiliation, Afraid, Rape, and Kick questionnaire    Fear of Current or Ex-Partner: No    Emotionally Abused: No    Physically Abused: No    Sexually Abused: No      Current Outpatient Medications on File Prior to Visit  Medication Sig Dispense Refill   albuterol (VENTOLIN HFA) 108 (90 Base) MCG/ACT inhaler INHALE 2 PUFFS EVERY 6 HOURS AS NEEDED FOR WHEEZING OR SHORTNESS OF BREATH. 8.5 g 0   ALPRAZolam (XANAX) 0.5 MG tablet TAKE (1) TABLET BY MOUTH TWICE A DAY AS NEEDED. 60 tablet 0   citalopram (CELEXA) 40 MG tablet TAKE (1/2) TABLET BY MOUTH AT BEDTIME. 15 tablet 5   [facility]  ACETAMINOPHEN 325 MG tablet Take by mouth daily.     denosumab (PROLIA) 60 MG/ML SOSY injection INJECT INTO UPPER ARM, THIGH, OR ABDOMEN ONCE. 1 mL 0   dexamethasone (DECADRON) 4 MG tablet Take 1 tablet by mouth every day 30 tablet 11   Fluticasone-Umeclidin-Vilant (TRELEGY ELLIPTA) 100-62.5-25 MCG/ACT AEPB Inhale 1 puff into the lungs daily. 3 each 3   guaifenesin (HUMIBID E) 400 MG TABS tablet Take 400 mg by mouth 4 (four) times daily.     levocetirizine (XYZAL) 5 MG tablet Take 1 tablet by mouth every day 30 tablet 11   levothyroxine (SYNTHROID) 50 MCG tablet Take 1 tablet by mouth before breakfast 30 tablet 11   Multiple Vitamin (MULTIVITAMIN WITH MINERALS) TABS tablet Take 1 tablet by mouth daily.     ondansetron (ZOFRAN) 4 MG tablet TAKE 1 TABLET BY MOUTH 3 TIMES DAILY AS NEEDED FOR NAUSEA AND VOMITING. 90 tablet 0   osimertinib mesylate (TAGRISSO) 80 MG tablet TAKE 1 TABLET (80 MG TOTAL) BY MOUTH DAILY. 30 tablet 2   OVER THE COUNTER MEDICATION Bone Support 2-4 tabs daily     OXcarbazepine (TRILEPTAL) 150 MG tablet Take 1 tablet by mouth twice daily 60 tablet 11   predniSONE (DELTASONE) 20 MG tablet Take 2 tablets (40 mg total) by mouth daily with breakfast. 14 tablet 0   diphenhydrAMINE (BENADRYL) 50 MG tablet Take 1 tablet (50 mg total) by mouth once for 1 dose. Take  one hour prior to CT scan 1 tablet 0   No current facility-administered medications on file prior to visit.   Allergies  Allergen Reactions   Ciprofloxacin Hcl Hives   Sulfonamide Derivatives Hives and Rash   Ciprofloxacin Hives   Iohexol Other (See Comments)    UNSPECIFIED REACTION  Desc: Per [facility], pt is allergic to IV contrast, no type of reaction was available. Pt cannot remember but first reacted around 15 yrs ago from an IVP. Notes were date from 2009 at urology center., [place] Date: [id number]    Iodinated Contrast Media Rash and Itching   Prozac [Fluoxetine Hcl] Rash   Social History    Socioeconomic History   Marital status: Divorced    Spouse name: Not on file   Number of children: Not on file   Years of education: Not on file   Highest education level: Not on file  Occupational History   Not on file  Tobacco Use   Smoking status: Former    Packs/day: 0.50    Years: 20.00    Total pack years: 10.00    Types: Cigarettes    Quit date: 12/21/2010    Years since quitting: 11.3   Smokeless tobacco: Never   Tobacco comments:    smoking cessation info given and reviewed   Vaping Use   Vaping Use: Never used  Substance and Sexual Activity   Alcohol use: Not Currently    Alcohol/week: 0.0 standard drinks of alcohol   Drug use: No   Sexual activity: Yes    Birth control/protection: Surgical  Other Topics Concern   Not on file  Social History Narrative   Divorced since 1.Lives alone with a dog.Marland KitchenRetired.  She receives Meals on Wheels.   Social Determinants of Health   Financial Resource Strain: Low Risk  (04/13/2021)   Overall Financial Resource Strain (CARDIA)    Difficulty of Paying Living Expenses: Not hard at all  Food Insecurity: No Food Insecurity (04/13/2021)   Hunger Vital Sign    Worried About Running Out of Food in the Last Year: Never true    Ran Out of Food in the Last Year: Never true  Transportation Needs: No Transportation Needs (04/13/2021)   PRAPARE - Hydrologist (Medical): No    Lack of Transportation (Non-Medical): No  Physical Activity: Inactive (04/13/2021)   Exercise Vital Sign    Days of Exercise per Week: 0 days    Minutes of Exercise per Session: 0 min  Stress: No Stress Concern Present (04/13/2021)   [place]    Feeling of Stress : Not at all  Social Connections: Socially Isolated (04/13/2021)   Social Connection and Isolation Panel [NHANES]    Frequency of Communication with Friends and Family: More than three times a week    Frequency of  Social Gatherings with Friends and Family: More than three times a week    Attends Religious Services: Never    Marine scientist or Organizations: No    Attends Archivist Meetings: Never    Marital Status: Divorced  Human resources officer Violence: Not At Risk (04/13/2021)   Humiliation, Afraid, Rape, and Kick questionnaire    Fear of Current or Ex-Partner: No    Emotionally Abused: No    Physically Abused: No    Sexually Abused: No    Review of Systems  All other systems reviewed and are negative.  Objective:   Physical Exam Constitutional:      General: She is not in acute distress.    Appearance: She is well-developed and underweight. She is ill-appearing. She is not diaphoretic.  HENT:     Mouth/Throat:     Mouth: No oral lesions.     Pharynx: Uvula midline. No oropharyngeal exudate or posterior oropharyngeal erythema.  Neck:     Thyroid: No thyromegaly.     Vascular: No JVD.  Cardiovascular:     Rate and Rhythm: Regular rhythm. Tachycardia present.     Heart sounds: Normal heart sounds. No murmur heard. Pulmonary:     Effort: Tachypnea, accessory muscle usage and prolonged expiration present. No respiratory distress.     Breath sounds: Decreased air movement present. No stridor. Examination of the right-upper field reveals decreased breath sounds and rhonchi. Examination of the left-upper field reveals decreased breath sounds and rhonchi. Examination of the right-middle field reveals decreased breath sounds and rhonchi. Examination of the left-middle field reveals decreased breath sounds, rhonchi and rales. Examination of the right-lower field reveals decreased breath sounds and rhonchi. Examination of the left-lower field reveals decreased breath sounds. Decreased breath sounds, wheezing, rhonchi and rales present.  Abdominal:     General: Bowel sounds are normal. There is no distension.     Palpations: Abdomen is soft.     Tenderness: There is no abdominal  tenderness. There is no guarding or rebound.  Lymphadenopathy:     Cervical: No cervical adenopathy.  Neurological:     General: No focal deficit present.     Mental Status: She is alert and oriented to person, place, and time.  Psychiatric:        Mood and Affect: Mood is anxious.        Behavior: Behavior normal.        Thought Content: Thought content normal.            Assessment & Plan:  COPD exacerbation ([place])  Due to being 72% on 2 L, the patient was immediately placed on 4 L of oxygen via nasal cannula, she was given a DuoNeb x1 at 1145, she was also given Depo-Medrol 80 mg x 1 IM at 1151.  Patient was then monitored in the office.  By 1151, pulse oximetry had improved to 100% on 4 L if he was weaned back down to 2 L which is her baseline.  There she is 98% At 1230, the patient was rechecked.  She is much calmer.  Her pulse oximetry remains between 96 and 98% on 2 L which is her baseline.  She is maintained this now for over an hour after having received a DuoNeb and steroid.  She is feeling much calmer.  I do believe she is also suffering from panic attacks related to her dyspnea.  We discussed going to the emergency room but the patient appears much better.  Therefore we have decided to start her on prednisone 40 mg a day for 7 days.  She will start this tomorrow.  We will start empiric treatment for right basilar pneumonia with Augmentin 875 mg twice daily for 10 days and a Z-Pak to cover atypicals.  I am doing this based on the purulent sputum the patient reports in the right basilar crackles and rhonchi that I am hearing on exam.  She will use a DuoNeb every 6 hours.  She will discontinue Xanax and she will replace with Valium 2 mg every 6 hours as needed to try to  better manage her anxiety.  I will recheck the patient on Thursday.  If worsening go to the emergency room.

## 2022-05-09 ENCOUNTER — Other Ambulatory Visit (HOSPITAL_COMMUNITY): Payer: Self-pay

## 2022-05-09 MED ORDER — METHYLPREDNISOLONE ACETATE 80 MG/ML IJ SUSP: 80.0000 mg | Freq: Once | INTRAMUSCULAR | Status: DC

## 2022-05-09 NOTE — Addendum Note (Signed)
Addended by: Colman Cater on: 05/09/2022 09:20 AM   Modules accepted: Orders

## 2022-05-09 NOTE — Addendum Note (Signed)
Addended by: Colman Cater on: 05/09/2022 10:26 AM   Modules accepted: Orders

## 2022-05-10 ENCOUNTER — Other Ambulatory Visit: Payer: Self-pay

## 2022-05-10 ENCOUNTER — Ambulatory Visit (INDEPENDENT_AMBULATORY_CARE_PROVIDER_SITE_OTHER): Payer: PPO | Admitting: Family Medicine

## 2022-05-10 VITALS — BP 98/82 | HR 76 | Temp 98.6°F | Ht 61.0 in | Wt 93.0 lb

## 2022-05-10 DIAGNOSIS — J441 Chronic obstructive pulmonary disease with (acute) exacerbation: Secondary | ICD-10-CM

## 2022-05-10 NOTE — Progress Notes (Signed)
Subjective:    Patient ID: [name], female    DOB: Oct 23, 1940, 81 y.o.   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  She was originally diagnosed in 2012.  She underwent left lower lobe resection for well differentiated stage Ib bronchoalveolar cancer.  Margins were negative.  In 2020 she presented to the emergency room with aphasia.  MRI revealed multiple brain mets.  She underwent stereotactic left parietal craniotomy for resection of tumor in June 2020.  July 2021, MRI of the brain revealed two new metastases in the right cerebellum and right frontal lobe.  She was treated with stereotactic radiation therapy.  She has been managed with osimiritinib by oncology since 2021.  She recently returned to the area in June after having moved to [place] last year.  She presents today stating that she does not feel well.  She is normally on 2 L via nasal cannula.  On arrival pulse oximetry was 72% on 3 L.  The patient has markedly diminished breath sounds bilaterally.  He has rhonchorous breath sounds all throughout the right lung with decreased airflow and faint expiratory wheezing.  She has left basilar crackles in the left lung and also diminished breath sounds throughout.  Patient was given a DuoNeb immediately and put on 4 L via nasal cannula.  Pulse oximetry improved to 91% on 4 L.  Initial DuoNeb was started at 1140. After first breathing treatment, at 1151, pulse oximetry was up to 100% on 4 L.  Therefore the patient was weaned back down to 2 L.  She was given 80 mg of Depo-Medrol IM x1 at 11:51.  She is clearly having a COPD exacerbation.  After the first breathing treatment, her breath sounds improved.  She has rhonchi to all throughout the right lung with expiratory wheezes and diminished breath sounds.  She continues to have persistent crackles in the left lower lung but this is the site of her previous lung  resection.  She also has expiratory wheezes and rhonchi in the left upper lobe.  At that time, my plan was:  Due to being 72% on 2 L, the patient was immediately placed on 4 L of oxygen via nasal cannula, she was given a DuoNeb x1 at 1145, she was also given Depo-Medrol 80 mg x 1 IM at 1151.  Patient was then monitored in the office.  By 1151, pulse oximetry had improved to 100% on 4 L if he was weaned back down to 2 L which is her baseline.  There she is 98% At 1230, the patient was rechecked.  She is much calmer.  Her pulse oximetry remains between 96 and 98% on 2 L which is her baseline.  She is maintained this now for over an hour after having received a DuoNeb and steroid.  She is feeling much calmer.  I do believe she is also suffering from panic attacks related to her dyspnea.  We discussed going to the emergency room but the patient appears much better.  Therefore we have decided to start her on prednisone 40 mg a day for 7 days.  She will start this tomorrow.  We will start empiric treatment for right basilar pneumonia with Augmentin 875 mg twice daily for 10 days and a Z-Pak to cover atypicals.  I am doing this based on the purulent sputum the patient reports in the right basilar crackles and rhonchi that I  am hearing on exam.  She will use a DuoNeb every 6 hours.  She will discontinue Xanax and she will replace with Valium 2 mg every 6 hours as needed to try to better manage her anxiety.  I will recheck the patient on Thursday.  If worsening go to the emergency room.  05/10/22 Patient is much calmer today.  She is breathing more comfortably.  She states that her breathing is better.  She is 97% on 2 L.  There is no increased work of breathing although she still has prominent right basilar rhonchi and crackles suggesting community-acquired pneumonia. Past Medical History:  Diagnosis Date   Adenocarcinoma of lung ([place]) 12/2010   left lung/surg only   Allergic rhinitis    Aneurysm of carotid artery  ([place])    corrected by surgery 08/21/13   Anxiety disorder    Aortic aneurysm ([place])    Brain metastasis ([place])    lung cancer s/p resection 7/20   Cancer ([place])    Phreesia 84/69/6295   Complication of anesthesia 2011   bloodpressure dropped during colonoscopy, not problems since   Compression fracture    COPD (chronic obstructive pulmonary disease) ([facility])    Depression    Diastolic dysfunction    Diverticulosis    Dysphagia    Emphysema lung ([place]) 11/08/2014   Headache    Hiatal hernia    History of kidney stones    Hypothyroidism    IBS (irritable bowel syndrome)    Iron deficiency anemia due to chronic blood loss 12/05/2016   On home O2 12/15/2013   chronic hypoxia   Pneumonia    PUD (peptic ulcer disease)    23yrs   Shortness of breath    with exertion   SIADH (syndrome of inappropriate ADH production) ([place])    Trigeminal neuralgia    Past Surgical History:  Procedure Laterality Date   ABDOMINAL HYSTERECTOMY     APPLICATION OF CRANIAL NAVIGATION N/A 03/18/2019   Procedure: APPLICATION OF CRANIAL NAVIGATION;  Surgeon: Consuella Lose, MD;  Location: [facility];  Service: Neurosurgery;  Laterality: N/A;   BACK SURGERY  January 13, 2014   BRAIN SURGERY N/A    Phreesia 03/28/2020   CATARACT EXTRACTION     CATARACT EXTRACTION W/PHACO  09/02/2012   Procedure: CATARACT EXTRACTION PHACO AND INTRAOCULAR LENS PLACEMENT (IOC);  Surgeon: Elta Guadeloupe T. 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 Oneida Alar: moderate diverticulosis, hemorrhoids, redundant colon. next TCS in 10-15 years.    CRANIOTOMY Left 03/18/2019   Procedure: Left stereotactic craniotomy for tumor resection;  Surgeon: Consuella Lose, MD;  Location: [facility];  Service: Neurosurgery;  Laterality: Left;  Left stereotactic craniotomy for tumor resection    ENDARTERECTOMY Right 08/21/2013   Procedure: RIGHT CAROTID ANEURYSM RESECTION;  Surgeon: Rosetta Posner, MD;  Location: [facility];  Service: Vascular;  Laterality: Right;   ESOPHAGEAL DILATION  02/24/2018   Procedure: ESOPHAGEAL DILATION;  Surgeon: Danie Binder, MD;  [facility];  Service: Endoscopy;;   ESOPHAGOGASTRODUODENOSCOPY  11/13/2009   w/dilation to 53mm, gastric ulceration (H.Pylori) s/p treatment   ESOPHAGOGASTRODUODENOSCOPY  11/21/2009   distal esophageal web, gastritis   ESOPHAGOGASTRODUODENOSCOPY  03/14/12   MWU:[id number] in the distal esophagus/Mild gastritis/small HH. + H.pylori, prescribed Pylera. Finished treatment.    ESOPHAGOGASTRODUODENOSCOPY N/A 12/13/2017  Dr. Oneida Alar: esophageal web s/p dilation, gastritis, no h.pylori   ESOPHAGOGASTRODUODENOSCOPY N/A 02/24/2018   Dr. Oneida Alar: web in prox esophagus and in distal esophagus s/p dilation, mild gastritis, mild pyloric stenosis, mild post ulcer duodenal deformity at D1/D2   fatty tumor removal from lt groin     FRACTURE SURGERY Left    hand and left ring finger   GIVENS CAPSULE STUDY N/A 12/26/2017   incomplete   GIVENS CAPSULE STUDY N/A 02/24/2018   normal small bowel   KNEE SURGERY     patella tendon repair june 2019 [facility]   LUNG CANCER SURGERY  12/2010   Left VATS, minithoracotomy, LLL superior segmentectomy   ORIF PATELLA Right 03/18/2018   Procedure: OPEN REDUCTION INTERNAL (ORIF) FIXATION RIGHT PATELLA;  Surgeon: Carole Civil, MD;  Location[facility];  Service: Orthopedics;  Laterality: Right;   ORIF WRIST FRACTURE Left 04/09/2014   Procedure: OPEN REDUCTION INTERNAL FIXATION (ORIF) WRIST FRACTURE;  Surgeon: Renette Butters, MD;  Location: [facility];  Service: Orthopedics;  Laterality: Left;   PERCUTANEOUS PINNING Left 04/09/2014   Procedure: PERCUTANEOUS PINNING EXTREMITY;  Surgeon: Renette Butters, MD;  Location: [facility];  Service: Orthopedics;  Laterality: Left;   SAVORY DILATION N/A 12/13/2017   Procedure: SAVORY  DILATION;  Surgeon: Danie Binder, MD;  [facility];  Service: Endoscopy;  Laterality: N/A;   TUBAL LIGATION     vocal cord surgery  02/06/2011   laryngoscopy with bilateral vocal cord Radiesse injection for vocal cord paralysis   YAG LASER APPLICATION Left 33/00/7622   Procedure: YAG LASER APPLICATION;  Surgeon: Rutherford Guys, MD;  [facility];  Service: Ophthalmology;  Laterality: Left;   Current Outpatient Medications on File Prior to Visit  Medication Sig Dispense Refill   albuterol (VENTOLIN HFA) 108 (90 Base) MCG/ACT inhaler INHALE 2 PUFFS EVERY 6 HOURS AS NEEDED FOR WHEEZING OR SHORTNESS OF BREATH. 8.5 g 0   ALPRAZolam (XANAX) 0.5 MG tablet TAKE (1) TABLET BY MOUTH TWICE A DAY AS NEEDED. 60 tablet 0   amoxicillin-clavulanate (AUGMENTIN) 875-125 MG tablet Take 1 tablet by mouth 2 (two) times daily. 20 tablet 0   azithromycin (ZITHROMAX) 250 MG tablet 2 tabs poqday1, 1 tab poqday 2-5 6 tablet 0   citalopram (CELEXA) 40 MG tablet TAKE (1/2) TABLET BY MOUTH AT BEDTIME. 15 tablet 5   [facility] ACETAMINOPHEN 325 MG tablet Take by mouth daily.     denosumab (PROLIA) 60 MG/ML SOSY injection INJECT INTO UPPER ARM, THIGH, OR ABDOMEN ONCE. 1 mL 0   dexamethasone (DECADRON) 4 MG tablet Take 1 tablet by mouth every day 30 tablet 11   diazepam (VALIUM) 2 MG tablet Take 1 tablet (2 mg total) by mouth every 6 (six) hours as needed for anxiety (stop xanax). 30 tablet 0   Fluticasone-Umeclidin-Vilant (TRELEGY ELLIPTA) 100-62.5-25 MCG/ACT AEPB Inhale 1 puff into the lungs daily. 3 each 3   guaifenesin (HUMIBID E) 400 MG TABS tablet Take 400 mg by mouth 4 (four) times daily.     ipratropium-albuterol (DUONEB) 0.5-2.5 (3) MG/3ML SOLN Take 3 mLs by nebulization every 6 (six) hours as needed. 360 mL 1   levocetirizine (XYZAL) 5 MG tablet Take 1 tablet by mouth every day 30 tablet 11   levothyroxine (SYNTHROID) 50 MCG tablet Take 1 tablet by mouth before breakfast 30 tablet 11   Multiple Vitamin  (MULTIVITAMIN WITH MINERALS) TABS tablet Take 1 tablet by mouth daily.     ondansetron (ZOFRAN) 4 MG  tablet TAKE 1 TABLET BY MOUTH 3 TIMES DAILY AS NEEDED FOR NAUSEA AND VOMITING. 90 tablet 0   osimertinib mesylate (TAGRISSO) 80 MG tablet TAKE 1 TABLET (80 MG TOTAL) BY MOUTH DAILY. 30 tablet 2   OVER THE COUNTER MEDICATION Bone Support 2-4 tabs daily     OXcarbazepine (TRILEPTAL) 150 MG tablet Take 1 tablet by mouth twice daily 60 tablet 11   predniSONE (DELTASONE) 20 MG tablet Take 2 tablets (40 mg total) by mouth daily with breakfast. 14 tablet 0   diphenhydrAMINE (BENADRYL) 50 MG tablet Take 1 tablet (50 mg total) by mouth once for 1 dose. Take one hour prior to CT scan 1 tablet 0   Current Facility-Administered Medications on File Prior to Visit  Medication Dose Route Frequency Provider Last Rate Last Admin   methylPREDNISolone acetate (DEPO-MEDROL) injection 80 mg  80 mg Intramuscular Once [name], MD       Allergies  Allergen Reactions   Ciprofloxacin Hcl Hives   Sulfonamide Derivatives Hives and Rash   Ciprofloxacin Hives   Iohexol Other (See Comments)    UNSPECIFIED REACTION  Desc: Per [facility], pt is allergic to IV contrast, no type of reaction was available. Pt cannot remember but first reacted around 15 yrs ago from an IVP. Notes were date from 2009 at urology center., Onset Date: [id number]    Iodinated Contrast Media Rash and Itching   Prozac [Fluoxetine Hcl] Rash   Social History   Socioeconomic History   Marital status: Divorced    Spouse name: Not on file   Number of children: Not on file   Years of education: Not on file   Highest education level: Not on file  Occupational History   Not on file  Tobacco Use   Smoking status: Former    Packs/day: 0.50    Years: 20.00    Total pack years: 10.00    Types: Cigarettes    Quit date: 12/21/2010    Years since quitting: 11.3   Smokeless tobacco: Never   Tobacco comments:    smoking cessation info  given and reviewed   Vaping Use   Vaping Use: Never used  Substance and Sexual Activity   Alcohol use: Not Currently    Alcohol/week: 0.0 standard drinks of alcohol   Drug use: No   Sexual activity: Yes    Birth control/protection: Surgical  Other Topics Concern   Not on file  Social History Narrative   Divorced since 69.Lives alone with a dog.Marland KitchenRetired.  She receives Meals on Wheels.   Social Determinants of Health   Financial Resource Strain: Low Risk  (04/13/2021)   Overall Financial Resource Strain (CARDIA)    Difficulty of Paying Living Expenses: Not hard at all  Food Insecurity: No Food Insecurity (04/13/2021)   Hunger Vital Sign    Worried About Running Out of Food in the Last Year: Never true    Ran Out of Food in the Last Year: Never true  Transportation Needs: No Transportation Needs (04/13/2021)   PRAPARE - Hydrologist (Medical): No    Lack of Transportation (Non-Medical): No  Physical Activity: Inactive (04/13/2021)   Exercise Vital Sign    Days of Exercise per Week: 0 days    Minutes of Exercise per Session: 0 min  Stress: No Stress Concern Present (04/13/2021)   [place]    Feeling of Stress : Not at all  Social Connections:  Socially Isolated (04/13/2021)   Social Connection and Isolation Panel [NHANES]    Frequency of Communication with Friends and Family: More than three times a week    Frequency of Social Gatherings with Friends and Family: More than three times a week    Attends Religious Services: Never    Marine scientist or Organizations: No    Attends Archivist Meetings: Never    Marital Status: Divorced  Human resources officer Violence: Not At Risk (04/13/2021)   Humiliation, Afraid, Rape, and Kick questionnaire    Fear of Current or Ex-Partner: No    Emotionally Abused: No    Physically Abused: No    Sexually Abused: No      Current Outpatient  Medications on File Prior to Visit  Medication Sig Dispense Refill   albuterol (VENTOLIN HFA) 108 (90 Base) MCG/ACT inhaler INHALE 2 PUFFS EVERY 6 HOURS AS NEEDED FOR WHEEZING OR SHORTNESS OF BREATH. 8.5 g 0   ALPRAZolam (XANAX) 0.5 MG tablet TAKE (1) TABLET BY MOUTH TWICE A DAY AS NEEDED. 60 tablet 0   amoxicillin-clavulanate (AUGMENTIN) 875-125 MG tablet Take 1 tablet by mouth 2 (two) times daily. 20 tablet 0   azithromycin (ZITHROMAX) 250 MG tablet 2 tabs poqday1, 1 tab poqday 2-5 6 tablet 0   citalopram (CELEXA) 40 MG tablet TAKE (1/2) TABLET BY MOUTH AT BEDTIME. 15 tablet 5   [facility] ACETAMINOPHEN 325 MG tablet Take by mouth daily.     denosumab (PROLIA) 60 MG/ML SOSY injection INJECT INTO UPPER ARM, THIGH, OR ABDOMEN ONCE. 1 mL 0   dexamethasone (DECADRON) 4 MG tablet Take 1 tablet by mouth every day 30 tablet 11   diazepam (VALIUM) 2 MG tablet Take 1 tablet (2 mg total) by mouth every 6 (six) hours as needed for anxiety (stop xanax). 30 tablet 0   Fluticasone-Umeclidin-Vilant (TRELEGY ELLIPTA) 100-62.5-25 MCG/ACT AEPB Inhale 1 puff into the lungs daily. 3 each 3   guaifenesin (HUMIBID E) 400 MG TABS tablet Take 400 mg by mouth 4 (four) times daily.     ipratropium-albuterol (DUONEB) 0.5-2.5 (3) MG/3ML SOLN Take 3 mLs by nebulization every 6 (six) hours as needed. 360 mL 1   levocetirizine (XYZAL) 5 MG tablet Take 1 tablet by mouth every day 30 tablet 11   levothyroxine (SYNTHROID) 50 MCG tablet Take 1 tablet by mouth before breakfast 30 tablet 11   Multiple Vitamin (MULTIVITAMIN WITH MINERALS) TABS tablet Take 1 tablet by mouth daily.     ondansetron (ZOFRAN) 4 MG tablet TAKE 1 TABLET BY MOUTH 3 TIMES DAILY AS NEEDED FOR NAUSEA AND VOMITING. 90 tablet 0   osimertinib mesylate (TAGRISSO) 80 MG tablet TAKE 1 TABLET (80 MG TOTAL) BY MOUTH DAILY. 30 tablet 2   OVER THE COUNTER MEDICATION Bone Support 2-4 tabs daily     OXcarbazepine (TRILEPTAL) 150 MG tablet Take 1 tablet by mouth twice daily 60  tablet 11   predniSONE (DELTASONE) 20 MG tablet Take 2 tablets (40 mg total) by mouth daily with breakfast. 14 tablet 0   diphenhydrAMINE (BENADRYL) 50 MG tablet Take 1 tablet (50 mg total) by mouth once for 1 dose. Take one hour prior to CT scan 1 tablet 0   Current Facility-Administered Medications on File Prior to Visit  Medication Dose Route Frequency Provider Last Rate Last Admin   methylPREDNISolone acetate (DEPO-MEDROL) injection 80 mg  80 mg Intramuscular Once [name], MD       Allergies  Allergen Reactions  Ciprofloxacin Hcl Hives   Sulfonamide Derivatives Hives and Rash   Ciprofloxacin Hives   Iohexol Other (See Comments)    UNSPECIFIED REACTION  Desc: [facility], pt is allergic to IV contrast, no type of reaction was available. Pt cannot remember but first reacted around 15 yrs ago from an IVP. Notes were date from 2009 at urology center., Onset Date: [id number]    Iodinated Contrast Media Rash and Itching   Prozac [Fluoxetine Hcl] Rash   Social History   Socioeconomic History   Marital status: Divorced    Spouse name: Not on file   Number of children: Not on file   Years of education: Not on file   Highest education level: Not on file  Occupational History   Not on file  Tobacco Use   Smoking status: Former    Packs/day: 0.50    Years: 20.00    Total pack years: 10.00    Types: Cigarettes    Quit date: 12/21/2010    Years since quitting: 11.3   Smokeless tobacco: Never   Tobacco comments:    smoking cessation info given and reviewed   Vaping Use   Vaping Use: Never used  Substance and Sexual Activity   Alcohol use: Not Currently    Alcohol/week: 0.0 standard drinks of alcohol   Drug use: No   Sexual activity: Yes    Birth control/protection: Surgical  Other Topics Concern   Not on file  Social History Narrative   Divorced since 36.Lives alone with a dog.Marland KitchenRetired.  She receives Meals on Wheels.   Social Determinants of Health    Financial Resource Strain: Low Risk  (04/13/2021)   Overall Financial Resource Strain (CARDIA)    Difficulty of Paying Living Expenses: Not hard at all  Food Insecurity: No Food Insecurity (04/13/2021)   Hunger Vital Sign    Worried About Running Out of Food in the Last Year: Never true    Ran Out of Food in the Last Year: Never true  Transportation Needs: No Transportation Needs (04/13/2021)   PRAPARE - Hydrologist (Medical): No    Lack of Transportation (Non-Medical): No  Physical Activity: Inactive (04/13/2021)   Exercise Vital Sign    Days of Exercise per Week: 0 days    Minutes of Exercise per Session: 0 min  Stress: No Stress Concern Present (04/13/2021)   [place]    Feeling of Stress : Not at all  Social Connections: Socially Isolated (04/13/2021)   Social Connection and Isolation Panel [NHANES]    Frequency of Communication with Friends and Family: More than three times a week    Frequency of Social Gatherings with Friends and Family: More than three times a week    Attends Religious Services: Never    Marine scientist or Organizations: No    Attends Archivist Meetings: Never    Marital Status: Divorced  Human resources officer Violence: Not At Risk (04/13/2021)   Humiliation, Afraid, Rape, and Kick questionnaire    Fear of Current or Ex-Partner: No    Emotionally Abused: No    Physically Abused: No    Sexually Abused: No    Review of Systems  All other systems reviewed and are negative.      Objective:   Physical Exam Constitutional:      General: She is not in acute distress.    Appearance: She is well-developed and underweight. She is  not ill-appearing or diaphoretic.  HENT:     Mouth/Throat:     Mouth: No oral lesions.     Pharynx: Uvula midline. No oropharyngeal exudate or posterior oropharyngeal erythema.  Neck:     Thyroid: No thyromegaly.     Vascular: No  JVD.  Cardiovascular:     Rate and Rhythm: Regular rhythm. Tachycardia present.     Heart sounds: Normal heart sounds. No murmur heard. Pulmonary:     Effort: Prolonged expiration present. No tachypnea, accessory muscle usage or respiratory distress.     Breath sounds: Decreased air movement present. No stridor. Examination of the right-upper field reveals decreased breath sounds. Examination of the left-upper field reveals decreased breath sounds. Examination of the right-middle field reveals decreased breath sounds and rhonchi. Examination of the left-middle field reveals decreased breath sounds and rales. Examination of the right-lower field reveals decreased breath sounds, rhonchi and rales. Examination of the left-lower field reveals decreased breath sounds. Decreased breath sounds, wheezing, rhonchi and rales present.  Abdominal:     General: Bowel sounds are normal. There is no distension.     Palpations: Abdomen is soft.     Tenderness: There is no abdominal tenderness. There is no guarding or rebound.  Lymphadenopathy:     Cervical: No cervical adenopathy.  Neurological:     General: No focal deficit present.     Mental Status: She is alert and oriented to person, place, and time.  Psychiatric:        Behavior: Behavior normal.        Thought Content: Thought content normal.            Assessment & Plan:  COPD exacerbation ([place]) - Plan: DG Chest 2 View Patient is much improved compared to 2 days ago.  Wean back on DuoNeb to every 6 hours as needed.  Complete prednisone.  Obtain chest x-ray to evaluate for community-acquired pneumonia given abnormal breath sounds.

## 2022-05-10 NOTE — Telephone Encounter (Signed)
LM for pt to return called re xanax refill.  05/10/22,  Dr. Dennard Schaumann, Denied, I put her on valium instead 2 days ago.

## 2022-05-10 NOTE — Telephone Encounter (Signed)
LOV 05/10/22 Last refill 6/202/23, #60,   refills  Please review, thanks!

## 2022-05-10 NOTE — Telephone Encounter (Signed)
Divy Dose called in for pt stating that pt is out of refill request for this med ALPRAZolam Duanne Moron) 0.5 MG tablet [[id number]]. Please advise.

## 2022-05-11 ENCOUNTER — Ambulatory Visit (HOSPITAL_COMMUNITY)
Admission: RE | Admit: 2022-05-11 | Discharge: 2022-05-11 | Disposition: A | Discharge status: Home / Self Care | Payer: PPO | Source: Ambulatory Visit | Attending: Family Medicine | Admitting: Family Medicine

## 2022-05-11 ENCOUNTER — Inpatient Hospital Stay: Payer: PPO | Attending: Hematology and Oncology

## 2022-05-11 ENCOUNTER — Inpatient Hospital Stay (HOSPITAL_BASED_OUTPATIENT_CLINIC_OR_DEPARTMENT_OTHER): Payer: PPO | Admitting: Physician Assistant

## 2022-05-11 ENCOUNTER — Other Ambulatory Visit: Payer: Self-pay

## 2022-05-11 VITALS — BP 115/77 | HR 84 | Temp 97.7°F | Ht 61.0 in | Wt 94.3 lb

## 2022-05-11 DIAGNOSIS — C3492 Malignant neoplasm of unspecified part of left bronchus or lung: Secondary | ICD-10-CM

## 2022-05-11 DIAGNOSIS — J441 Chronic obstructive pulmonary disease with (acute) exacerbation: Secondary | ICD-10-CM | POA: Insufficient documentation

## 2022-05-11 DIAGNOSIS — C7931 Secondary malignant neoplasm of brain: Secondary | ICD-10-CM | POA: Insufficient documentation

## 2022-05-11 DIAGNOSIS — Z79899 Other long term (current) drug therapy: Secondary | ICD-10-CM | POA: Insufficient documentation

## 2022-05-11 DIAGNOSIS — J189 Pneumonia, unspecified organism: Secondary | ICD-10-CM | POA: Diagnosis not present

## 2022-05-11 DIAGNOSIS — C3432 Malignant neoplasm of lower lobe, left bronchus or lung: Secondary | ICD-10-CM | POA: Diagnosis not present

## 2022-05-11 DIAGNOSIS — J90 Pleural effusion, not elsewhere classified: Secondary | ICD-10-CM | POA: Diagnosis not present

## 2022-05-11 DIAGNOSIS — Z7952 Long term (current) use of systemic steroids: Secondary | ICD-10-CM | POA: Insufficient documentation

## 2022-05-11 LAB — CBC WITH DIFFERENTIAL (CANCER CENTER ONLY)
Abs Immature Granulocytes: 0.03 10*3/uL (ref 0.00–0.07)
Basophils Absolute: 0 10*3/uL (ref 0.0–0.1)
Basophils Relative: 0 %
Eosinophils Absolute: 0 10*3/uL (ref 0.0–0.5)
Eosinophils Relative: 0 %
HCT: 38.2 % (ref 36.0–46.0)
Hemoglobin: 11.8 g/dL — ABNORMAL LOW (ref 12.0–15.0)
Immature Granulocytes: 0 %
Lymphocytes Relative: 3 %
Lymphs Abs: 0.2 10*3/uL — ABNORMAL LOW (ref 0.7–4.0)
MCH: 28.4 pg (ref 26.0–34.0)
MCHC: 30.9 g/dL (ref 30.0–36.0)
MCV: 91.8 fL (ref 80.0–100.0)
Monocytes Absolute: 0.2 10*3/uL (ref 0.1–1.0)
Monocytes Relative: 2 %
Neutro Abs: 9.3 10*3/uL — ABNORMAL HIGH (ref 1.7–7.7)
Neutrophils Relative %: 95 %
Platelet Count: 346 10*3/uL (ref 150–400)
RBC: 4.16 MIL/uL (ref 3.87–5.11)
RDW: 15.2 % (ref 11.5–15.5)
WBC Count: 9.7 10*3/uL (ref 4.0–10.5)
nRBC: 0 % (ref 0.0–0.2)

## 2022-05-11 LAB — CMP (CANCER CENTER ONLY)
ALT: 36 U/L (ref 0–44)
AST: 26 U/L (ref 15–41)
Albumin: 4.1 g/dL (ref 3.5–5.0)
Alkaline Phosphatase: 71 U/L (ref 38–126)
Anion gap: 4 — ABNORMAL LOW (ref 5–15)
BUN: 25 mg/dL — ABNORMAL HIGH (ref 8–23)
CO2: 38 mmol/L — ABNORMAL HIGH (ref 22–32)
Calcium: 9.7 mg/dL (ref 8.9–10.3)
Chloride: 102 mmol/L (ref 98–111)
Creatinine: 0.49 mg/dL (ref 0.44–1.00)
GFR, Estimated: >60 mL/min (ref >60)
Glucose, Bld: 127 mg/dL — ABNORMAL HIGH (ref 70–99)
Potassium: 4 mmol/L (ref 3.5–5.1)
Sodium: 144 mmol/L (ref 135–145)
Total Bilirubin: 0.2 mg/dL — ABNORMAL LOW (ref 0.3–1.2)
Total Protein: 7.4 g/dL (ref 6.5–8.1)

## 2022-05-11 LAB — TSH: TSH: 1.03 u[IU]/mL (ref 0.350–4.500)

## 2022-05-14 ENCOUNTER — Telehealth: Payer: Self-pay

## 2022-05-14 ENCOUNTER — Other Ambulatory Visit (HOSPITAL_COMMUNITY): Payer: Self-pay

## 2022-05-14 ENCOUNTER — Encounter: Payer: Self-pay | Admitting: Family Medicine

## 2022-05-14 ENCOUNTER — Encounter (HOSPITAL_COMMUNITY): Payer: Self-pay | Admitting: Internal Medicine

## 2022-05-14 NOTE — Telephone Encounter (Signed)
Pls call pt to make an appt for her to come in to discuss results

## 2022-05-14 NOTE — Telephone Encounter (Signed)
Results are under her xray- Please call with results.

## 2022-05-14 NOTE — Progress Notes (Signed)
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 0/14 lymph nodes.  No perineural invasion or LVI.  negative margins 2) 03/03/2019: presented to ED with expressive aphasia, found to have multiple brain metastasis and underwent SRS on 03/17/2019. 3) 03/18/2019: Stereotactic left parietal craniotomy for resection of tumor 4) 04/30/2020: Brain MRI on showed 2 new subcentimeter meta stasis in the right cerebellum and right frontal lobe. 5)  05/20/2020: Stereotactic radiation therapy was completed 6) 06/23/2020: establish care with Dr. [name]  7) 08/29/2020: prescribed osimirtinib therapy due to next metastatic disease in the brain.  8) 09/05/2020: started osimirtinib 63m PO daily 9) 10/19/2020: CT Chest showed no evidence of residual disease 10) 11/21/2020: desquamation and erythema of finger tips. Concern for HFS. Temporarily holding therapy  11) 12/05/2020: restarted Tagrisso after resolution of the HFS symptoms 12) 07/12/2021: last visit [facility]  13) 03/08/2022: return to the care of Dr. DLorenso Courier Interval History:  Ruth SCathi Roan81y.o.. Sanford with medical history significant for metastatic adenocarcinoma of the lung with brain metastasis who presents for a follow up visit. The patient's last visit was on 04/02/2022.  In the interim since the last visit the patient has continued on  osimirtenib therapy.   On exam today [name] reports that she is recovering after recent COPD exacerbation on 05/08/2022. She is currently receiving DuoNeb treatment q 6 hours, completed prednisone and is currently on Augmentin. She reports she is feeling better and able to breath more easily. She is currently on 3-4L of supplemental oxygen. She continues to have productive cough. She is scheduled for a chest xray later today to assess if there is evidence of pneumonia. She reports her appetite is fairly stable without any significant weight changes. She denies nausea, vomiting, diarrhea or constipation. She is tolerating osimertinib without any prohibitive toxicities. She denies fevers, chills, sweats, chest pain, oral mucositis or redness/peeling of her hands. She has no other complaints. Rest of the 10 point ROS is below.    MEDICAL HISTORY:  Past Medical History:  Diagnosis Date   Adenocarcinoma of lung ([facility] 12/2010   left lung/surg only   Allergic rhinitis    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[place]    corrected by surgery 08/21/13   Anxiety disorder    Aortic aneurysm ([facility]    [place]rain [place] ([place]    lung cancer s/p resection 7/20   Cancer ([place]    Phreesia 090/30/0923  Complication of anesthesia 2011   bloodpressure dropped during colonoscopy, not problems since   Compression fracture    COPD (chronic obstructive pulmonary disease) (HCC)    Depression    Diastolic dysfunction    Diverticulosis    Dysphagia    Emphysema lung ([place] 11/08/2014   Headache    Hiatal hernia    History of kidney stones    Hypothyroidism    IBS (irritable bowel syndrome)    Iron deficiency anemia due to chronic blood loss 12/05/2016   On home O2 12/15/2013  chronic hypoxia   Pneumonia    PUD (peptic ulcer disease)    6yr   Shortness of breath    with exertion   SIADH (syndrome of inappropriate ADH production) (HCC)    Trigeminal neuralgia     SURGICAL HISTORY: Past Surgical History:  Procedure  Laterality Date   ABDOMINAL HYSTERECTOMY     APPLICATION OF CRANIAL NAVIGATION N/A 03/18/2019   Procedure: APPLICATION OF CRANIAL NAVIGATION;  Surgeon: NConsuella Lose MD;  [place]  Service: Neurosurgery;  Laterality: N/A;   BACK SURGERY  January 13, 2014   BRAIN SURGERY N/A    Phreesia 03/28/2020   CATARACT EXTRACTION     CATARACT EXTRACTION W/PHACO  09/02/2012   Procedure: CATARACT EXTRACTION PHACO AND INTRAOCULAR LENS PLACEMENT (IOC);  Surgeon: MElta GuadeloupeT. SGershon Crane MD;  Location: [facility];  Service: Ophthalmology;  Laterality: Left;  CDE:10.35   CHOLECYSTECTOMY     COLONOSCOPY  2011   hyperplastic polyp, 3-4 small cecal AVMs, nonbleeding   COLONOSCOPY N/A 11/23/2014   Dr. FOneida Alar moderate diverticulosis, hemorrhoids, redundant colon. next TCS in 10-15 years.    CRANIOTOMY Left 03/18/2019   Procedure: Left stereotactic craniotomy for tumor resection;  Surgeon: NConsuella Lose MD;  [place]  Service: Neurosurgery;  Laterality: Left;  Left stereotactic craniotomy for tumor resection   ENDARTERECTOMY Right 08/21/2013   Procedure: RIGHT CAROTID ANEURYSM RESECTION;  Surgeon: TRosetta Posner MD;  [place]  Service: Vascular;  Laterality: Right;   ESOPHAGEAL DILATION  02/24/2018   Procedure: ESOPHAGEAL DILATION;  Surgeon: FDanie Binder MD;  [facility];  Service: Endoscopy;;   ESOPHAGOGASTRODUODENOSCOPY  11/13/2009   w/dilation to 161m gastric ulceration (H.Pylori) s/p treatment   ESOPHAGOGASTRODUODENOSCOPY  11/21/2009   distal esophageal web, gastritis   ESOPHAGOGASTRODUODENOSCOPY  03/14/12   SLHYW:VPXTGGYIRn the distal esophagus/Mild gastritis/small HH. + H.pylori, prescribed Pylera. Finished treatment.    ESOPHAGOGASTRODUODENOSCOPY N/A 12/13/2017   Dr. FiOneida Alaresophageal web s/p dilation, gastritis, no h.pylori   ESOPHAGOGASTRODUODENOSCOPY N/A 02/24/2018   Dr. FiOneida Alarweb in prox esophagus and in distal esophagus s/p dilation, mild gastritis, mild pyloric  stenosis, mild post ulcer duodenal deformity at D1/D2   fatty tumor removal from lt groin     FRACTURE SURGERY Left    hand and left ring finger   GIVENS CAPSULE STUDY N/A 12/26/2017   incomplete   GIVENS CAPSULE STUDY N/A 02/24/2018   normal small bowel   KNEE SURGERY     patella tendon repair june 2019 [facility]   LUNG CANCER SURGERY  12/2010   Left VATS, minithoracotomy, LLL superior segmentectomy   ORIF PATELLA Right 03/18/2018   Procedure: OPEN REDUCTION INTERNAL (ORIF) FIXATION RIGHT PATELLA;  Surgeon: HaCarole CivilMD;  Location: [facility];  Service: Orthopedics;  Laterality: Right;   ORIF WRIST FRACTURE Left 04/09/2014   Procedure: OPEN REDUCTION INTERNAL FIXATION (ORIF) WRIST FRACTURE;  Surgeon: TiRenette ButtersMD;  [place] Service: Orth[place]ics;  Laterality: Left;   PERCUTANEOUS PINNING Left 04/09/2014   Procedure: PERCUTANEOUS PINNING EXTREMITY;  Surgeon: TiRenette ButtersMD;  [place] Service: Orthopedics;  Laterality: Left;   SAVORY DILATION N/A 12/13/2017   Procedure: SAVORY DILATION;  Surgeon: FiDanie BinderMD;  [facility];  Service: Endoscopy;  Laterality: N/A;   TUBAL LIGATION     vocal cord surgery  02/06/2011   laryngoscopy with bilateral vocal cord Radiesse injection for vocal cord paralysis   YAG LASER APPLICATION Left  09/20/2015   Procedure: YAG LASER APPLICATION;  Surgeon: Rutherford Guys, MD;  Location: [facility];  Service: Ophthalmology;  Laterality: Left;    SOCIAL HISTORY: Social History   Socioeconomic History   Marital status: Divorced    Spouse name: Not on file   Number of children: Not on file   Years of education: Not on file   Highest education level: Not on file  Occupational History   Not on file  Tobacco Use   Smoking status: Former    Packs/day: 0.50    Years: 20.00    Total pack years: 10.00    Types: Cigarettes    Quit date: 12/21/2010    Years since quitting: 11.4   Smokeless tobacco: Never   Tobacco  comments:    smoking cessation info given and reviewed   Vaping Use   Vaping Use: Never used  Substance and Sexual Activity   Alcohol use: Not Currently    Alcohol/week: 0.0 standard drinks of alcohol   Drug use: No   Sexual activity: Yes    Birth control/protection: Surgical  Other Topics Concern   Not on file  Social History Narrative   Divorced since 13.Lives alone with a dog.Marland KitchenRetired.  She receives Meals on Wheels.   Social Determinants of Health   Financial Resource Strain: Low Risk  (04/13/2021)   Overall Financial Resource Strain (CARDIA)    Difficulty of Paying Living Expenses: Not hard at all  Food Insecurity: No Food Insecurity (04/13/2021)   Hunger Vital Sign    Worried About Running Out of Food in the Last Year: Never true    Ran Out of Food in the Last Year: Never true  Transportation Needs: No Transportation Needs (04/13/2021)   PRAPARE - Hydrologist (Medical): No    Lack of Transportation (Non-Medical): No  Physical Activity: Inactive (04/13/2021)   Exercise Vital Sign    Days of Exercise per Week: 0 days    Minutes of Exercise per Session: 0 min  Stress: No Stress Concern Present (04/13/2021)   [place]    Feeling of Stress : Not at all  Social Connections: Socially Isolated (04/13/2021)   Social Connection and Isolation Panel [NHANES]    Frequency of Communication with Friends and Family: More than three times a week    Frequency of Social Gatherings with Friends and Family: More than three times a week    Attends Religious Services: Never    Marine scientist or Organizations: No    Attends Archivist Meetings: Never    Marital Status: Divorced  Human resources officer Violence: Not At Risk (04/13/2021)   Humiliation, Afraid, Rape, and Kick questionnaire    Fear of Current or Ex-Partner: No    Emotionally Abused: No    Physically Abused: No    Sexually Abused:  No    FAMILY HISTORY: Family History  Problem Relation Age of Onset   Pulmonary embolism Mother    Heart attack Father    Hypertension Father    Liver cancer Sister    Cancer Sister    Cancer Brother    Cancer Daughter    Breast cancer Daughter    Colon cancer Neg Hx     ALLERGIES:  is allergic to ciprofloxacin hcl, sulfonamide derivatives, ciprofloxacin, iohexol, iodinated contrast media, and prozac [fluoxetine hcl].  MEDICATIONS:  Current Outpatient Medications  Medication Sig Dispense Refill   albuterol (VENTOLIN HFA) 108 (90  Base) MCG/ACT inhaler INHALE 2 PUFFS EVERY 6 HOURS AS NEEDED FOR WHEEZING OR SHORTNESS OF BREATH. 8.5 g 0   ALPRAZolam (XANAX) 0.5 MG tablet TAKE (1) TABLET BY MOUTH TWICE A DAY AS NEEDED. 60 tablet 0   amoxicillin-clavulanate (AUGMENTIN) 875-125 MG tablet Take 1 tablet by mouth 2 (two) times daily. 20 tablet 0   azithromycin (ZITHROMAX) 250 MG tablet 2 tabs poqday1, 1 tab poqday 2-5 6 tablet 0   citalopram (CELEXA) 40 MG tablet TAKE (1/2) TABLET BY MOUTH AT BEDTIME. 15 tablet 5   [employer] ACETAMINOPHEN 325 MG tablet Take by mouth daily.     denosumab (PROLIA) 60 MG/ML SOSY injection INJECT INTO UPPER ARM, THIGH, OR ABDOMEN ONCE. 1 mL 0   dexamethasone (DECADRON) 4 MG tablet Take 1 tablet by mouth every day 30 tablet 11   diazepam (VALIUM) 2 MG tablet Take 1 tablet (2 mg total) by mouth every 6 (six) hours as needed for anxiety (stop xanax). 30 tablet 0   Fluticasone-Umeclidin-Vilant (TRELEGY ELLIPTA) 100-62.5-25 MCG/ACT AEPB Inhale 1 puff into the lungs daily. 3 each 3   guaifenesin (HUMIBID E) 400 MG TABS tablet Take 400 mg by mouth 4 (four) times daily.     ipratropium-albuterol (DUONEB) 0.5-2.5 (3) MG/3ML SOLN Take 3 mLs by nebulization every 6 (six) hours as needed. 360 mL 1   levocetirizine (XYZAL) 5 MG tablet Take 1 tablet by mouth every day 30 tablet 11   levothyroxine (SYNTHROID) 50 MCG tablet Take 1 tablet by mouth before breakfast 30 tablet 11    Multiple Vitamin (MULTIVITAMIN WITH MINERALS) TABS tablet Take 1 tablet by mouth daily.     ondansetron (ZOFRAN) 4 MG tablet TAKE 1 TABLET BY MOUTH 3 TIMES DAILY AS NEEDED FOR NAUSEA AND VOMITING. 90 tablet 0   osimertinib mesylate (TAGRISSO) 80 MG tablet TAKE 1 TABLET (80 MG TOTAL) BY MOUTH DAILY. 30 tablet 2   OVER THE COUNTER MEDICATION Bone Support 2-4 tabs daily     OXcarbazepine (TRILEPTAL) 150 MG tablet Take 1 tablet by mouth twice daily 60 tablet 11   predniSONE (DELTASONE) 20 MG tablet Take 2 tablets (40 mg total) by mouth daily with breakfast. 14 tablet 0   diphenhydrAMINE (BENADRYL) 50 MG tablet Take 1 tablet (50 mg total) by mouth once for 1 dose. Take one hour prior to CT scan 1 tablet 0   Current Facility-Administered Medications  Medication Dose Route Frequency Provider Last Rate Last Admin   methylPREDNISolone acetate (DEPO-MEDROL) injection 80 mg  80 mg Intramuscular Once [name], MD        REVIEW OF SYSTEMS:   Constitutional: ( - ) fevers, ( - )  chills , ( - ) night sweats Eyes: ( - ) blurriness of vision, ( - ) double vision, ( - ) watery eyes Ears, nose, mouth, throat, and face: ( - ) mucositis, ( - ) sore throat Respiratory: ( +) cough, ( - ) dyspnea, ( + ) wheezes Cardiovascular: ( - ) palpitation, ( - ) chest discomfort, ( - ) lower extremity swelling Gastrointestinal:  ( - ) nausea, ( - ) heartburn, ( - ) change in bowel habits Skin: ( - ) abnormal skin rashes Lymphatics: ( - ) new lymphadenopathy, ( - ) easy bruising Neurological: ( - ) numbness, ( - ) tingling, ( - ) new weaknesses Behavioral/Psych: ( - ) mood change, ( - ) new changes  All other systems were reviewed with the patient and are negative.  PHYSICAL  EXAMINATION: Telephone Visit  LABORATORY DATA:  I have reviewed the data as listed    Latest Ref Rng & Units 05/11/2022    9:42 AM 04/02/2022    2:33 PM 03/08/2022    8:55 AM  CBC  WBC 4.0 - 10.5 K/uL 9.7  6.4  7.1   Hemoglobin 12.0 - 15.0  g/dL 11.8  11.6  11.7   Hematocrit 36.0 - 46.0 % 38.2  37.3  37.9   Platelets 150 - 400 K/uL 346  282  258        Latest Ref Rng & Units 05/11/2022    9:42 AM 04/02/2022    2:33 PM 03/08/2022    8:55 AM  CMP  Glucose 70 - 99 mg/dL 127  136  76   BUN 8 - 23 mg/dL 25  21  16    Creatinine 0.44 - 1.00 mg/dL 0.49  0.53  0.52   Sodium 135 - 145 mmol/L 144  139  139   Potassium 3.5 - 5.1 mmol/L 4.0  4.2  4.0   Chloride 98 - 111 mmol/L 102  102  101   CO2 22 - 32 mmol/L 38  33  33   Calcium 8.9 - 10.3 mg/dL 9.7  9.3  9.5   Total Protein 6.5 - 8.1 g/dL 7.4  6.5  7.0   Total Bilirubin 0.3 - 1.2 mg/dL 0.2  0.3  0.2   Alkaline Phos 38 - 126 U/L 71  40  47   AST 15 - 41 U/L 26  26  21    ALT 0 - 44 U/L 36  37  17     RADIOGRAPHIC STUDIES:  MRI Brain 08/05/2020:   CLINICAL DATA:  Trigeminal neuralgia. Headache, cluster/trigeminal. Brain/CNS neoplasm, assess treatment response. Metastatic lung cancer. Additional history provided: SRS to 5 brain metastases 03/17/2019, left parietal craniotomy for tumor resection 03/18/2019, SRS to 2 additional metastases 01/01/2020.   EXAM: MRI HEAD WITHOUT AND WITH CONTRAST   MRA HEAD WITHOUT CONTRAST   TECHNIQUE: Multiplanar, multiecho pulse sequences of the brain and surrounding structures were obtained without and with intravenous contrast. Angiographic images of the head were obtained using MRA technique without contrast.   CONTRAST:  50m MULTIHANCE GADOBENATE DIMEGLUMINE 529 MG/ML IV SOLN   COMPARISON:  Prior brain MRI examinations 04/30/2020 and earlier. MRA head 03/04/2019.   FINDINGS: MRI HEAD FINDINGS   Brain:   Stable generalized cerebral atrophy.   Again demonstrated is a hemosiderin stained left parietooccipital resection cavity. Irregular and gyriform enhancement has increased from the prior examination, now measuring up to 12 mm in thickness (for instance as seen on series 14, image 90) (series 15, image 9). This irregular  enhancement extends to the site of a previously demonstrated 3 mm nodular focus of enhancement within the superolateral aspect of the cavity. Surrounding T2/FLAIR hyperintense signal abnormality has not significantly changed.   A previously demonstrated 4 mm lesion within the medial right cerebellum is no longer appreciated.   A 4 mm enhancing lesion within the left cerebellar hemisphere was present on the prior examination but seen to better advantage on today's study (series 14, image 48).   New 2 mm enhancing lesion more posteriorly within the medial left cerebellar hemisphere (series 14, image 50).   New 2 mm enhancing lesion within the lateral left cerebellum (series 14, image 42).   A 3 mm lesion within the high posterior right frontal lobe has decreased in size (series 14, image 145) (previously 5 mm).  A previously demonstrated 2 mm enhancing lesion within the left frontal lobe has increased in size, now measuring 8 mm (series 14, image 125) (series 11, image 39) . The lesion now demonstrates central T2/FLAIR hyperintensity and T1 hyperintensity with a peripheral low signal rim. Precontrast T1 hyperintensity limits evaluation for enhancement at this site.   Multifocal T2/FLAIR hyperintensity elsewhere within the cerebral white matter and within the pons is nonspecific, but compatible chronic small vessel ischemic disease.   No evidence of acute infarction.   No extra-axial fluid collection.   No midline shift.   Vascular: Expected proximal arterial flow voids.   Skull and upper cervical spine: No focal suspicious marrow lesion.   Sinuses/Orbits: Visualized orbits show no acute finding. Trace ethmoid sinus mucosal thickening. No significant mastoid effusion.   MRA HEAD FINDINGS   The intracranial internal carotid arteries are patent. The M1 middle cerebral arteries are patent without significant stenosis. No M2 proximal branch occlusion or high-grade proximal  stenosis is identified. The anterior cerebral arteries are patent. 1-2 mm inferiorly projecting vascular protrusion arising from the supraclinoid right ICA which may reflect an infundibulum or small aneurysm (series 103, image 12).   The intracranial vertebral arteries are patent. The basilar artery is patent. The posterior cerebral arteries are patent.   IMPRESSION: MRI brain:   1. Irregular and gyriform enhancement at site of the left parietooccipital resection cavity has increased, now measuring up to 12 mm in thickness. This enhancement extends to the site of a previously demonstrated 3 mm nodular enhancing focus within the superolateral aspect of the cavity. Findings may reflect post-treatment inflammation or recurrent tumor. Short interval MRI follow-up is recommended. Surrounding T2 hyperintense signal abnormality has not appreciably changed. 2. New 2 mm metastasis within the posteromedial left cerebellar hemisphere. 3. New 2 mm metastasis within the lateral left cerebellum. 4. An additional 4 mm metastasis within the left cerebellum was present on the prior MRI of 04/30/2020, but is seen to better advantage on today's study. 5. A previously demonstrated 4 mm metastasis within the medial right cerebellum is no longer appreciated 6. 8 mm metastasis within the paramedian left frontal lobe, increased in size and now demonstrating precontrast T1 hyperintensity which limits evaluation for enhancement. 7. A 3 mm lesion within the high posterior right frontal lobe has decreased in size.   MRA head:   1. No intracranial large vessel occlusion or proximal high-grade arterial stenosis. 2. 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     Electronically Signed   By: Kellie Simmering DO   On: 08/05/2020 13:49  DG Chest 2 View  Result Date: 05/11/2022 CLINICAL DATA:  Follow-up pneumonia EXAM: CHEST - 2 VIEW COMPARISON:  Radiographs 04/04/2022  FINDINGS: Unchanged cardiomediastinal silhouette. Aortic calcification. Similar small bilateral pleural effusions. Increased patchy airspace opacities in the left mid and lower lung. No definite pneumothorax. Thoracolumbar fusion hardware. Numerous thoracic vertebral body compression fractures, unchanged. Remote right rib fracture. IMPRESSION: Increased patchy opacities in the left mid and lower lung could be due to pneumonia in the appropriate clinical setting. Small bilateral pleural effusions. Electronically Signed   By: Placido Sou M.D.   On: 05/11/2022 17:24     ASSESSMENT & PLAN [name] 81 y.o. Sanford with medical history significant for metastatic adenocarcinoma of the lung with brain metastasis who presents for a follow up visit.  After review the records, review of the labs, review the imaging, discussion with the patient the findings  are most consistent with a stage IV adenocarcinoma of the lung with a small lung lesion and metastatic spread to the brain.  Her metastatic brain mets have been treated with resection of tumor on 03/18/2019 as well as SRS on 12/12/2019.  Most recently she was found to have 2 new subcentimeter lesions and underwent stereotactic radiation therapy which was completed on 05/20/2020. Unfortunately on 08/05/2020 she was noted to have new CNS metastatic disease.   There are limitations in what therapies can be used for this patient given her poor functional status.  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  After extensive discussions with the patient's about the risks and benefits of this treatment she was agreeable to starting osimertinib 80 mg p.o. daily for her metastatic adenocarcinoma of the lung. She started this treatment on 09/05/2020.  On discussion today Mrs. Goltz is tolerating Tagrisso well and is willing and able to proceed at this time.  Restaging CT scans are planned for August 2023.   #Stage IV  ([id number]) adenocarcinoma of the lung: -Left lower lobe resection on 12/21/2010, 14 lymph nodes negative, 2.2 cm tumor size, margins negative. -most recent MRI brain scan shows stable disease. No evidence of Chest/abdomen involvement on scan from 02/09/2022.  --started therapy with osimertinib 80 mg PO daily for her EGFR Exon 19 mutation. She started on 09/05/2020 -- Patient is a poor candidate for systemic therapy, though she does have an EGFR Exon 19 mutation.  Plan:  -CT chest with contrast on 02/09/2022 showed no active disease in the chest. Continue CT scans q 3 months. Next due August 2023 --labs today adequate for continued treatment: Creatinine 0.49, hemoglobin 11.8, white blood cell count 9.7, and platelets of 346 --RTC in 4 weeks time to assure continued tolerance of osimirtenib.   #Hand Foot Syndrome, resolved --previously noted to have desquamation and erythema of finger tips. No evidence of symptoms at today's visit.  --held therapy for 1-2 weeks until resolution --patient had resolution of symptoms after 2 weeks off therapy. Encouraged continue aggressive moisturizing lotion.    #Brain metastasis, stable -Presentation with expressive aphasia to ER on 03/03/2019 along with right-sided weakness.  Found to have multiple brain metastasis and underwent SRS on 03/17/2019. 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 -Underwent SRS on 01/01/2020 for 2 subcentimeter brain lesions. -Brain MRI on 04/30/2020 showed 2 new subcentimeter meta stasis in the right cerebellum and right frontal lobe. -Stereotactic radiation therapy was completed on 05/20/2020 --most recent MRI on 02/09/2022 showed stable disease. Continue systemic therapy as above.   #COPD exacerbation with possible pneumonia: --Currently on 3-4 L of O2 --DuoNeb treatments 3-4 x day --Currently on Augmentin therapy started on 05/08/2022 --Chest xray today  to evaluate for improvement --Plan to follow up with PCP for additional recommendations.   No orders of the defined types were placed in this encounter.   All questions were answered. The patient knows to call the clinic with any problems, questions or concerns.  I have spent a total of 30 minutes minutes of face-to-face and non-face-to-face time, preparing to see the patient,  performing a medically appropriate examination, counseling and educating the patient, documenting clinical information in the electronic health record, and care coordination.   Dede Query PA-C Dept of Hematology and [place] at [facility] Phone: [phone]  05/14/2022 8:56 AM

## 2022-05-14 NOTE — Telephone Encounter (Signed)
Called re lab results. Pt also wanted to know if you can Rx something for anxiety for around the clock? Pt feels that the valium is not helping. Pt is not the valium instead pt continue to take xanax.  Please advice

## 2022-05-14 NOTE — Telephone Encounter (Signed)
Pt called in stating that she would like to discuss her x-ray results. Please advise  Cb#: [phone]

## 2022-05-16 ENCOUNTER — Other Ambulatory Visit: Payer: Self-pay

## 2022-05-16 NOTE — Telephone Encounter (Signed)
Xanax LOV 05/10/22 Last refill 03/27/22, #60, 0 refills  Citalopram LOV 05/10/22 Last refill 09/13/2021, #15, 5 refills  Please review, thanks!   Please review, thanks!

## 2022-05-17 ENCOUNTER — Other Ambulatory Visit: Payer: Self-pay | Admitting: Family Medicine

## 2022-05-17 MED ORDER — ALPRAZOLAM 0.5 MG PO TABS: 0.5000 mg | ORAL_TABLET | Freq: Two times a day (BID) | ORAL | 0 refills | Status: AC | PRN

## 2022-05-17 MED ORDER — CITALOPRAM HYDROBROMIDE 40 MG PO TABS
ORAL_TABLET | ORAL | 5 refills | Status: DC
Start: 2022-05-17 — End: 2022-05-17

## 2022-05-17 MED ORDER — CITALOPRAM HYDROBROMIDE 40 MG PO TABS: ORAL_TABLET | ORAL | 5 refills | Status: DC

## 2022-05-17 MED ORDER — ALPRAZOLAM 0.5 MG PO TABS: ORAL_TABLET | ORAL | 0 refills | Status: DC

## 2022-05-22 ENCOUNTER — Other Ambulatory Visit: Payer: Self-pay

## 2022-05-22 ENCOUNTER — Encounter (HOSPITAL_COMMUNITY): Payer: Self-pay

## 2022-05-22 ENCOUNTER — Inpatient Hospital Stay (HOSPITAL_COMMUNITY)
Admission: EM | Admit: 2022-05-22 | Discharge: 2022-05-26 | DRG: 190 | Disposition: A | Discharge status: Hospice | Payer: PPO | Attending: Internal Medicine | Admitting: Internal Medicine

## 2022-05-22 ENCOUNTER — Emergency Department (HOSPITAL_COMMUNITY): Payer: PPO

## 2022-05-22 ENCOUNTER — Telehealth: Payer: Self-pay

## 2022-05-22 DIAGNOSIS — R7989 Other specified abnormal findings of blood chemistry: Secondary | ICD-10-CM | POA: Diagnosis not present

## 2022-05-22 DIAGNOSIS — J439 Emphysema, unspecified: Secondary | ICD-10-CM | POA: Diagnosis not present

## 2022-05-22 DIAGNOSIS — C3490 Malignant neoplasm of unspecified part of unspecified bronchus or lung: Secondary | ICD-10-CM | POA: Diagnosis not present

## 2022-05-22 DIAGNOSIS — Z8711 Personal history of peptic ulcer disease: Secondary | ICD-10-CM

## 2022-05-22 DIAGNOSIS — Z7952 Long term (current) use of systemic steroids: Secondary | ICD-10-CM | POA: Diagnosis not present

## 2022-05-22 DIAGNOSIS — Z91041 Radiographic dye allergy status: Secondary | ICD-10-CM

## 2022-05-22 DIAGNOSIS — Z923 Personal history of irradiation: Secondary | ICD-10-CM

## 2022-05-22 DIAGNOSIS — L8990 Pressure ulcer of unspecified site, unspecified stage: Secondary | ICD-10-CM | POA: Insufficient documentation

## 2022-05-22 DIAGNOSIS — R0689 Other abnormalities of breathing: Secondary | ICD-10-CM | POA: Diagnosis not present

## 2022-05-22 DIAGNOSIS — G500 Trigeminal neuralgia: Secondary | ICD-10-CM | POA: Diagnosis not present

## 2022-05-22 DIAGNOSIS — Z681 Body mass index (BMI) 19 or less, adult: Secondary | ICD-10-CM

## 2022-05-22 DIAGNOSIS — Z66 Do not resuscitate: Secondary | ICD-10-CM | POA: Diagnosis not present

## 2022-05-22 DIAGNOSIS — C7931 Secondary malignant neoplasm of brain: Secondary | ICD-10-CM | POA: Diagnosis present

## 2022-05-22 DIAGNOSIS — Z79899 Other long term (current) drug therapy: Secondary | ICD-10-CM | POA: Diagnosis not present

## 2022-05-22 DIAGNOSIS — R636 Underweight: Secondary | ICD-10-CM | POA: Diagnosis not present

## 2022-05-22 DIAGNOSIS — J441 Chronic obstructive pulmonary disease with (acute) exacerbation: Secondary | ICD-10-CM | POA: Diagnosis not present

## 2022-05-22 DIAGNOSIS — Z87891 Personal history of nicotine dependence: Secondary | ICD-10-CM | POA: Diagnosis not present

## 2022-05-22 DIAGNOSIS — Z79890 Hormone replacement therapy (postmenopausal): Secondary | ICD-10-CM

## 2022-05-22 DIAGNOSIS — R0902 Hypoxemia: Secondary | ICD-10-CM | POA: Diagnosis not present

## 2022-05-22 DIAGNOSIS — K589 Irritable bowel syndrome without diarrhea: Secondary | ICD-10-CM | POA: Diagnosis present

## 2022-05-22 DIAGNOSIS — F419 Anxiety disorder, unspecified: Secondary | ICD-10-CM | POA: Diagnosis present

## 2022-05-22 DIAGNOSIS — F39 Unspecified mood [affective] disorder: Secondary | ICD-10-CM | POA: Diagnosis not present

## 2022-05-22 DIAGNOSIS — Z7951 Long term (current) use of inhaled steroids: Secondary | ICD-10-CM

## 2022-05-22 DIAGNOSIS — Z90710 Acquired absence of both cervix and uterus: Secondary | ICD-10-CM

## 2022-05-22 DIAGNOSIS — S2231XA Fracture of one rib, right side, initial encounter for closed fracture: Secondary | ICD-10-CM | POA: Diagnosis not present

## 2022-05-22 DIAGNOSIS — R54 Age-related physical debility: Secondary | ICD-10-CM | POA: Diagnosis not present

## 2022-05-22 DIAGNOSIS — J9621 Acute and chronic respiratory failure with hypoxia: Secondary | ICD-10-CM | POA: Diagnosis not present

## 2022-05-22 DIAGNOSIS — Z7982 Long term (current) use of aspirin: Secondary | ICD-10-CM

## 2022-05-22 DIAGNOSIS — Z881 Allergy status to other antibiotic agents status: Secondary | ICD-10-CM

## 2022-05-22 DIAGNOSIS — K5790 Diverticulosis of intestine, part unspecified, without perforation or abscess without bleeding: Secondary | ICD-10-CM | POA: Diagnosis not present

## 2022-05-22 DIAGNOSIS — Z85118 Personal history of other malignant neoplasm of bronchus and lung: Secondary | ICD-10-CM

## 2022-05-22 DIAGNOSIS — E039 Hypothyroidism, unspecified: Secondary | ICD-10-CM

## 2022-05-22 DIAGNOSIS — J90 Pleural effusion, not elsewhere classified: Secondary | ICD-10-CM | POA: Diagnosis not present

## 2022-05-22 DIAGNOSIS — J189 Pneumonia, unspecified organism: Secondary | ICD-10-CM | POA: Diagnosis not present

## 2022-05-22 DIAGNOSIS — Z87442 Personal history of urinary calculi: Secondary | ICD-10-CM

## 2022-05-22 DIAGNOSIS — Z888 Allergy status to other drugs, medicaments and biological substances status: Secondary | ICD-10-CM

## 2022-05-22 DIAGNOSIS — Z8701 Personal history of pneumonia (recurrent): Secondary | ICD-10-CM

## 2022-05-22 DIAGNOSIS — Z9851 Tubal ligation status: Secondary | ICD-10-CM

## 2022-05-22 DIAGNOSIS — R0600 Dyspnea, unspecified: Secondary | ICD-10-CM | POA: Diagnosis not present

## 2022-05-22 DIAGNOSIS — I272 Pulmonary hypertension, unspecified: Secondary | ICD-10-CM | POA: Diagnosis not present

## 2022-05-22 DIAGNOSIS — Z20822 Contact with and (suspected) exposure to covid-19: Secondary | ICD-10-CM | POA: Diagnosis not present

## 2022-05-22 DIAGNOSIS — Z902 Acquired absence of lung [part of]: Secondary | ICD-10-CM

## 2022-05-22 LAB — CBC
HCT: 35.7 % — ABNORMAL LOW (ref 36.0–46.0)
HCT: 37.4 % (ref 36.0–46.0)
Hemoglobin: 10.8 g/dL — ABNORMAL LOW (ref 12.0–15.0)
Hemoglobin: 11.4 g/dL — ABNORMAL LOW (ref 12.0–15.0)
MCH: 28.6 pg (ref 26.0–34.0)
MCH: 28.4 pg (ref 26.0–34.0)
MCHC: 30.3 g/dL (ref 30.0–36.0)
MCHC: 30.5 g/dL (ref 30.0–36.0)
MCV: 94.4 fL (ref 80.0–100.0)
MCV: 93.3 fL (ref 80.0–100.0)
Platelets: 316 10*3/uL (ref 150–400)
Platelets: 298 10*3/uL (ref 150–400)
RBC: 3.78 MIL/uL — ABNORMAL LOW (ref 3.87–5.11)
RBC: 4.01 MIL/uL (ref 3.87–5.11)
RDW: 14.5 % (ref 11.5–15.5)
RDW: 14.3 % (ref 11.5–15.5)
WBC: 8.9 10*3/uL (ref 4.0–10.5)
WBC: 9.5 10*3/uL (ref 4.0–10.5)
nRBC: 0 % (ref 0.0–0.2)
nRBC: 0 % (ref 0.0–0.2)

## 2022-05-22 LAB — CREATININE, SERUM
Creatinine, Ser: 0.45 mg/dL (ref 0.44–1.00)
GFR, Estimated: >60 mL/min (ref >60)

## 2022-05-22 LAB — RESP PANEL BY RT-PCR (FLU A&B, COVID) ARPGX2
Influenza A by PCR: NEGATIVE
Influenza B by PCR: NEGATIVE
SARS Coronavirus 2 by RT PCR: NEGATIVE

## 2022-05-22 LAB — COMPREHENSIVE METABOLIC PANEL
ALT: 29 U/L (ref 0–44)
AST: 16 U/L (ref 15–41)
Albumin: 3.4 g/dL — ABNORMAL LOW (ref 3.5–5.0)
Alkaline Phosphatase: 58 U/L (ref 38–126)
Anion gap: 5 (ref 5–15)
BUN: 20 mg/dL (ref 8–23)
CO2: 37 mmol/L — ABNORMAL HIGH (ref 22–32)
Calcium: 9 mg/dL (ref 8.9–10.3)
Chloride: 98 mmol/L (ref 98–111)
Creatinine, Ser: 0.42 mg/dL — ABNORMAL LOW (ref 0.44–1.00)
GFR, Estimated: >60 mL/min (ref >60)
Glucose, Bld: 109 mg/dL — ABNORMAL HIGH (ref 70–99)
Potassium: 3.9 mmol/L (ref 3.5–5.1)
Sodium: 140 mmol/L (ref 135–145)
Total Bilirubin: 0.3 mg/dL (ref 0.3–1.2)
Total Protein: 6.4 g/dL — ABNORMAL LOW (ref 6.5–8.1)

## 2022-05-22 LAB — D-DIMER, QUANTITATIVE: D-Dimer, Quant: 0.7 ug/mL-FEU — ABNORMAL HIGH (ref 0.00–0.50)

## 2022-05-22 LAB — BRAIN NATRIURETIC PEPTIDE: B Natriuretic Peptide: 62.2 pg/mL (ref 0.0–100.0)

## 2022-05-22 MED ORDER — ALBUTEROL SULFATE (2.5 MG/3ML) 0.083% IN NEBU
2.5000 mg | INHALATION_SOLUTION | RESPIRATORY_TRACT | Status: AC | PRN
  Administered 2022-05-22 – 2022-05-23 (×2): 2.5 mg via RESPIRATORY_TRACT
  Filled 2022-05-22 (×2): qty 3

## 2022-05-22 MED ORDER — DOXYCYCLINE HYCLATE 100 MG PO TABS
100.0000 mg | ORAL_TABLET | Freq: Two times a day (BID) | ORAL | Status: DC
  Administered 2022-05-22 – 2022-05-26 (×8): 100 mg via ORAL
  Filled 2022-05-22 (×8): qty 1

## 2022-05-22 MED ORDER — ONDANSETRON HCL 4 MG/2ML IJ SOLN: 4.0000 mg | Freq: Four times a day (QID) | INTRAMUSCULAR | Status: DC | PRN

## 2022-05-22 MED ORDER — SODIUM CHLORIDE 0.9% FLUSH: 3.0000 mL | INTRAVENOUS | Status: DC | PRN

## 2022-05-22 MED ORDER — LORATADINE 10 MG PO TABS
10.0000 mg | ORAL_TABLET | Freq: Every day | ORAL | Status: DC
  Administered 2022-05-23 – 2022-05-26 (×4): 10 mg via ORAL
  Filled 2022-05-22 (×4): qty 1

## 2022-05-22 MED ORDER — LEVOTHYROXINE SODIUM 50 MCG PO TABS
50.0000 ug | ORAL_TABLET | Freq: Every day | ORAL | Status: DC
  Administered 2022-05-23 – 2022-05-26 (×4): 50 ug via ORAL
  Filled 2022-05-22 (×4): qty 1

## 2022-05-22 MED ORDER — SODIUM CHLORIDE 0.9% FLUSH
3.0000 mL | Freq: Two times a day (BID) | INTRAVENOUS | Status: DC
  Administered 2022-05-22 – 2022-05-26 (×8): 3 mL via INTRAVENOUS

## 2022-05-22 MED ORDER — SODIUM CHLORIDE 0.9 % IV SOLN: 250.0000 mL | INTRAVENOUS | Status: DC | PRN

## 2022-05-22 MED ORDER — ONDANSETRON HCL 4 MG PO TABS: 4.0000 mg | ORAL_TABLET | Freq: Four times a day (QID) | ORAL | Status: DC | PRN

## 2022-05-22 MED ORDER — ENSURE ENLIVE PO LIQD
237.0000 mL | Freq: Two times a day (BID) | ORAL | Status: DC
  Administered 2022-05-23 – 2022-05-26 (×9): 237 mL via ORAL

## 2022-05-22 MED ORDER — LEVOCETIRIZINE DIHYDROCHLORIDE 5 MG PO TABS: 5.0000 mg | ORAL_TABLET | Freq: Every day | ORAL | Status: DC

## 2022-05-22 MED ORDER — IPRATROPIUM-ALBUTEROL 0.5-2.5 (3) MG/3ML IN SOLN
3.0000 mL | Freq: Once | RESPIRATORY_TRACT | Status: AC
  Administered 2022-05-22: 3 mL via RESPIRATORY_TRACT
  Filled 2022-05-22: qty 3

## 2022-05-22 MED ORDER — CITALOPRAM HYDROBROMIDE 10 MG PO TABS: 40.0000 mg | ORAL_TABLET | Freq: Every day | ORAL | Status: DC

## 2022-05-22 MED ORDER — FLUTICASONE FUROATE-VILANTEROL 100-25 MCG/ACT IN AEPB
1.0000 | INHALATION_SPRAY | Freq: Every day | RESPIRATORY_TRACT | Status: DC
  Administered 2022-05-23: 1 via RESPIRATORY_TRACT
  Filled 2022-05-22: qty 28

## 2022-05-22 MED ORDER — ALPRAZOLAM 0.5 MG PO TABS
0.5000 mg | ORAL_TABLET | Freq: Two times a day (BID) | ORAL | Status: DC | PRN
  Administered 2022-05-22 – 2022-05-26 (×8): 0.5 mg via ORAL
  Filled 2022-05-22 (×8): qty 1

## 2022-05-22 MED ORDER — OXCARBAZEPINE 150 MG PO TABS
150.0000 mg | ORAL_TABLET | Freq: Two times a day (BID) | ORAL | Status: DC
  Administered 2022-05-22 – 2022-05-26 (×8): 150 mg via ORAL
  Filled 2022-05-22 (×9): qty 1

## 2022-05-22 MED ORDER — METHYLPREDNISOLONE SODIUM SUCC 125 MG IJ SOLR
125.0000 mg | Freq: Once | INTRAMUSCULAR | Status: AC
  Administered 2022-05-22: 125 mg via INTRAVENOUS
  Filled 2022-05-22: qty 2

## 2022-05-22 MED ORDER — HYDROCODONE-ACETAMINOPHEN 5-325 MG PO TABS
1.0000 | ORAL_TABLET | ORAL | Status: DC | PRN
  Administered 2022-05-23 – 2022-05-25 (×2): 2 via ORAL
  Filled 2022-05-22 (×2): qty 2

## 2022-05-22 MED ORDER — ACETAMINOPHEN 325 MG PO TABS
650.0000 mg | ORAL_TABLET | Freq: Four times a day (QID) | ORAL | Status: DC | PRN
  Administered 2022-05-25: 650 mg via ORAL
  Filled 2022-05-22: qty 2

## 2022-05-22 MED ORDER — CITALOPRAM HYDROBROMIDE 20 MG PO TABS
20.0000 mg | ORAL_TABLET | Freq: Every day | ORAL | Status: DC
Start: 2022-05-23 — End: 2022-05-26
  Administered 2022-05-23 – 2022-05-25 (×3): 20 mg via ORAL
  Filled 2022-05-22 (×3): qty 1

## 2022-05-22 MED ORDER — ENOXAPARIN SODIUM 30 MG/0.3ML IJ SOSY
30.0000 mg | PREFILLED_SYRINGE | INTRAMUSCULAR | Status: DC
  Administered 2022-05-22 – 2022-05-25 (×4): 30 mg via SUBCUTANEOUS
  Filled 2022-05-22 (×4): qty 0.3

## 2022-05-22 MED ORDER — IPRATROPIUM-ALBUTEROL 0.5-2.5 (3) MG/3ML IN SOLN: 3.0000 mL | RESPIRATORY_TRACT | Status: DC | PRN

## 2022-05-22 MED ORDER — UMECLIDINIUM BROMIDE 62.5 MCG/ACT IN AEPB
1.0000 | INHALATION_SPRAY | Freq: Every day | RESPIRATORY_TRACT | Status: DC
  Administered 2022-05-23: 1 via RESPIRATORY_TRACT
  Filled 2022-05-22: qty 7

## 2022-05-22 MED ORDER — ACETAMINOPHEN 650 MG RE SUPP: 650.0000 mg | Freq: Four times a day (QID) | RECTAL | Status: DC | PRN

## 2022-05-22 NOTE — ED Triage Notes (Signed)
Pt BIB EMS from home for [facility]. Pt recently on abx for PNE, increased difficulty breathing  Hx COPD, CA 96% on 6lpm 130/72 24 RR 72 HR

## 2022-05-22 NOTE — Telephone Encounter (Signed)
Pt called with c/o oxygen level being 91% on 3 L of O2. In talking with patient, pt states she has to take several deep breaths to get her level up from 88% to 91% on the 3 Liters. Advised pt that because she is having to struggle to get oxygen up, that she should go to ER. Pt verbalized understanding and states she will call EMS to transport her. Thank you.

## 2022-05-22 NOTE — H&P (Signed)
History and Physical    [name] NGE:[id number] DOB: [birth year] DOA: 05/22/2022  PCP: [name], MD  Patient coming from: Home  Chief Complaint: SOB  HPI: [name] is a 81 y.o. female with medical history significant of chronic respiratory failure on 2L O2 at home, anxiety, COPD, hypothyroidism, history of lung cancer s/p resection who presents with SOB. She states that she has not been feeling well for "quiet a while" sounds like months. She was recently seen by primary care physician for pneumonia and COPD exacerbation 8/1 and completed prednisone and Augmentin/azithromycin. She states her breathing did improve with treatments, but her energy level has still remained low.  She came into the emergency department because she "could not deal with this alone at home anymore."  She did denies any fevers or chills, chest pains.  She admits to having hard time eating food.  No nausea or vomiting reported.  No abdominal pain reported.  ED Course: In the emergency department, she required oxygen bumped up to 6 L to maintain her saturations.  Labs overall unremarkable.  BNP 62.2.  D-dimer 0.7, which is low risk for age-adjusted.  COVID and influenza were negative.  Chest x-ray with improved aeration since previous.  Review of Systems: As per HPI. Otherwise, all other review of systems reviewed and are negative.   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[place])    corrected by surgery 08/21/13   Anxiety disorder    Aortic aneurysm (HCC)    Brain metastasis    lung cancer s/p resection 7/20   Cancer ([place])    Phreesia 40/07/2724   Complication of anesthesia 2011   bloodpressure dropped during colonoscopy, not problems since   Compression fracture    COPD (chronic obstructive pulmonary disease) (HCC)    Depression    Diastolic dysfunction    Diverticulosis    Dysphagia     Emphysema lung ([place]) 11/08/2014   Headache    Hiatal hernia    History of kidney stones    Hypothyroidism    IBS (irritable bowel syndrome)    Iron deficiency anemia due to chronic blood loss 12/05/2016   On home O2 12/15/2013   chronic hypoxia   Pneumonia    PUD (peptic ulcer disease)    60yrs   Shortness of breath    with exertion   SIADH (syndrome of inappropriate ADH production) ([place])    Trigeminal neuralgia     Past Surgical History:  Procedure Laterality Date   ABDOMINAL HYSTERECTOMY     APPLICATION OF CRANIAL NAVIGATION N/A 03/18/2019   Procedure: APPLICATION OF CRANIAL NAVIGATION;  Surgeon: Consuella Lose, MD;  Location: [place];  Service: Neurosurgery;  Laterality: N/A;   BACK SURGERY  January 13, 2014   BRAIN SURGERY N/A    Phreesia 03/28/2020   CATARACT EXTRACTION     CATARACT EXTRACTION W/PHACO  09/02/2012   Procedure: CATARACT EXTRACTION PHACO AND INTRAOCULAR LENS PLACEMENT (IOC);  Surgeon: Elta Guadeloupe T. 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 Oneida Alar: moderate diverticulosis, hemorrhoids, redundant colon. next TCS in 10-15 years.    CRANIOTOMY Left 03/18/2019   Procedure: Left stereotactic craniotomy for tumor resection;  Surgeon: Consuella Lose, MD;  Location: [place];  Service: Neurosurgery;  Laterality: Left;  Left stereotactic craniotomy for tumor resection   ENDARTERECTOMY Right 08/21/2013   Procedure: RIGHT CAROTID ANEURYSM RESECTION;  Surgeon: Rosetta Posner, MD;  Location: [place];  Service: Vascular;  Laterality: Right;   ESOPHAGEAL DILATION  02/24/2018   Procedure: ESOPHAGEAL DILATION;  Surgeon: Danie Binder, MD;  [facility];  Service: Endoscopy;;   ESOPHAGOGASTRODUODENOSCOPY  11/13/2009   w/dilation to 78mm, gastric ulceration (H.Pylori) s/p treatment    ESOPHAGOGASTRODUODENOSCOPY  11/21/2009   distal esophageal web, gastritis   ESOPHAGOGASTRODUODENOSCOPY  03/14/12   JQZ:ESPQZRAQT in the distal esophagus/Mild gastritis/small HH. + H.pylori, prescribed Pylera. Finished treatment.    ESOPHAGOGASTRODUODENOSCOPY N/A 12/13/2017   Dr. Oneida Alar: esophageal web s/p dilation, gastritis, no h.pylori   ESOPHAGOGASTRODUODENOSCOPY N/A 02/24/2018   Dr. Oneida Alar: web in prox esophagus and in distal esophagus s/p dilation, mild gastritis, mild pyloric stenosis, mild post ulcer duodenal deformity at D1/D2   fatty tumor removal from lt groin     FRACTURE SURGERY Left    hand and left ring finger   GIVENS CAPSULE STUDY N/A 12/26/2017   incomplete   GIVENS CAPSULE STUDY N/A 02/24/2018   normal small bowel   KNEE SURGERY     patella tendon repair june 2019 [facility]   LUNG CANCER SURGERY  12/2010   Left VATS, minithoracotomy, LLL superior segmentectomy   ORIF PATELLA Right 03/18/2018   Procedure: OPEN REDUCTION INTERNAL (ORIF) FIXATION RIGHT PATELLA;  Surgeon: Carole Civil, MD;  Location: [facility];  Service: Orthopedics;  Laterality: Right;   ORIF WRIST FRACTURE Left 04/09/2014   Procedure: OPEN REDUCTION INTERNAL FIXATION (ORIF) WRIST FRACTURE;  Surgeon: Renette Butters, MD;  Location: [place];  Service: Orthopedics;  Laterality: Left;   PERCUTANEOUS PINNING Left 04/09/2014   Procedure: PERCUTANEOUS PINNING EXTREMITY;  Surgeon: Renette Butters, MD;  Location: [place];  Service: Orthopedics;  Laterality: Left;   SAVORY DILATION N/A 12/13/2017   Procedure: SAVORY DILATION;  Surgeon: Danie Binder, MD;  [facility];  Service: Endoscopy;  Laterality: N/A;   TUBAL LIGATION     vocal cord surgery  02/06/2011   laryngoscopy with bilateral vocal cord Radiesse injection for vocal cord paralysis   YAG LASER APPLICATION Left 62/26/3335   Procedure: YAG LASER APPLICATION;  Surgeon: Rutherford Guys, MD;  [facility];  Service: Ophthalmology;  Laterality: Left;      reports that she quit smoking about 11 years ago. Her smoking use included cigarettes. She has a 10.00 pack-year smoking history. She has never used smokeless tobacco. She reports that she does not currently use alcohol. She reports that she does not use drugs.  Allergies  Allergen Reactions   Ciprofloxacin Hcl Hives   Sulfonamide Derivatives Hives and Rash   Ciprofloxacin Hives   Iohexol Other (See Comments)    UNSPECIFIED REACTION  Desc: Per [facility], pt is allergic to IV contrast, no type of reaction was available. Pt cannot remember but first reacted around 15 yrs ago from an IVP. Notes were date from 2009 at urology center., Onset Date: [id number]    Iodinated Contrast Media Rash and Itching   Prozac [Fluoxetine Hcl] Rash    Family History  Problem Relation Age of Onset   Pulmonary embolism Mother    Heart attack Father    Hypertension Father    Liver cancer Sister    Cancer Sister    Cancer Brother  Cancer Daughter    Breast cancer Daughter    Colon cancer Neg Hx     Prior to Admission medications   Medication Sig Start Date End Date Taking? Authorizing Provider  albuterol (VENTOLIN HFA) 108 (90 Base) MCG/ACT inhaler INHALE 2 PUFFS EVERY 6 HOURS AS NEEDED FOR WHEEZING OR SHORTNESS OF BREATH. 03/13/22   Orson Slick, MD  ALPRAZolam Duanne Moron) 0.5 MG tablet Take 1 tablet (0.5 mg total) by mouth 2 (two) times daily as needed for anxiety. TAKE (1) TABLET BY MOUTH TWICE A DAY AS NEEDED. 05/17/22   [name], MD  amoxicillin-clavulanate (AUGMENTIN) 875-125 MG tablet Take 1 tablet by mouth 2 (two) times daily. 05/08/22   [name], MD  azithromycin (ZITHROMAX) 250 MG tablet 2 tabs poqday1, 1 tab poqday 2-5 05/08/22   [name], MD  citalopram (CELEXA) 40 MG tablet TAKE (1/2) TABLET BY MOUTH AT BEDTIME. 05/17/22   [name], MD  [facility] ACETAMINOPHEN 325 MG tablet Take by mouth daily. 01/17/22   [provider]  denosumab (PROLIA) 60 MG/ML SOSY  injection INJECT INTO UPPER ARM, THIGH, OR ABDOMEN ONCE. 06/07/21   [name], MD  dexamethasone (DECADRON) 4 MG tablet Take 1 tablet by mouth every day 04/13/22   [name], MD  diphenhydrAMINE (BENADRYL) 50 MG tablet Take 1 tablet (50 mg total) by mouth once for 1 dose. Take one hour prior to CT scan 06/20/20 04/13/21  Derek Jack, MD  Fluticasone-Umeclidin-Vilant (TRELEGY ELLIPTA) 100-62.5-25 MCG/ACT AEPB Inhale 1 puff into the lungs daily. 10/24/21   [name], MD  guaifenesin (HUMIBID E) 400 MG TABS tablet Take 400 mg by mouth 4 (four) times daily. 02/07/22   [provider]  ipratropium-albuterol (DUONEB) 0.5-2.5 (3) MG/3ML SOLN Take 3 mLs by nebulization every 6 (six) hours as needed. 05/08/22   [name], MD  levocetirizine Harlow Ohms) 5 MG tablet Take 1 tablet by mouth every day 03/18/22   [name], MD  levothyroxine (SYNTHROID) 50 MCG tablet Take 1 tablet by mouth before breakfast 03/18/22   [name], MD  Multiple Vitamin (MULTIVITAMIN WITH MINERALS) TABS tablet Take 1 tablet by mouth daily.    [provider]  ondansetron (ZOFRAN) 4 MG tablet TAKE 1 TABLET BY MOUTH 3 TIMES DAILY AS NEEDED FOR NAUSEA AND VOMITING. 05/05/21   [name], MD  osimertinib mesylate (TAGRISSO) 80 MG tablet TAKE 1 TABLET (80 MG TOTAL) BY MOUTH DAILY. 03/19/22 03/19/23  Orson Slick, MD  OVER THE COUNTER MEDICATION Bone Support 2-4 tabs daily    [provider]  OXcarbazepine (TRILEPTAL) 150 MG tablet Take 1 tablet by mouth twice daily 03/18/22   [name], MD  predniSONE (DELTASONE) 20 MG tablet Take 2 tablets (40 mg total) by mouth daily with breakfast. 05/08/22   [name], MD    Physical Exam: Vitals:   05/22/22 1630 05/22/22 1655 05/22/22 1700 05/22/22 1730  BP: 119/71  129/80 134/75  Pulse: 72  77 78  Resp: (!) 24  (!) 21 18  Temp:  97.6 F (36.4 C)    TempSrc:      SpO2: 98%  96% 96%  Weight:      Height:          Constitutional: NAD, calm, comfortable, very thin Eyes: PERRL, lids and conjunctivae normal ENMT: Mucous membranes are moist. Normal dentition.  Respiratory: Diminished breath sounds bilaterally, without wheeze, no increase in  respiratory effort, no accessory muscle use.  On 6 L oxygen Cardiovascular: Regular rate and rhythm, no murmurs. No extremity edema.  Abdomen: Soft, nondistended, nontender to palpation. Bowel sounds positive.  Musculoskeletal: No joint deformity upper and lower extremities. No contractures. Normal muscle tone.  Skin: no rashes, lesions, ulcers on exposed skin  Neurologic: Alert and oriented, speech fluent Psychiatric: Normal judgment and insight. Normal mood and affect   Labs on Admission: I have personally reviewed following labs and imaging studies  CBC: Recent Labs  Lab 05/22/22 1426  WBC 8.9  HGB 10.8*  HCT 35.7*  MCV 94.4  PLT 283   Basic Metabolic Panel: Recent Labs  Lab 05/22/22 1426  NA 140  K 3.9  CL 98  CO2 37*  GLUCOSE 109*  BUN 20  CREATININE 0.42*  CALCIUM 9.0   GFR: Estimated Creatinine Clearance: 37.9 mL/min (A) (by C-G formula based on SCr of 0.42 mg/dL (L)). Liver Function Tests: Recent Labs  Lab 05/22/22 1426  AST 16  ALT 29  ALKPHOS 58  BILITOT 0.3  PROT 6.4*  ALBUMIN 3.4*   No results for input(s): "LIPASE", "AMYLASE" in the last 168 hours. No results for input(s): "AMMONIA" in the last 168 hours. Coagulation Profile: No results for input(s): "INR", "PROTIME" in the last 168 hours. Cardiac Enzymes: No results for input(s): "CKTOTAL", "CKMB", "CKMBINDEX", "TROPONINI" in the last 168 hours. BNP (last 3 results) No results for input(s): "PROBNP" in the last 8760 hours. HbA1C: No results for input(s): "HGBA1C" in the last 72 hours. CBG: No results for input(s): "GLUCAP" in the last 168 hours. Lipid Profile: No results for input(s): "CHOL", "HDL", "LDLCALC", "TRIG", "CHOLHDL", "LDLDIRECT" in the last 72  hours. Thyroid Function Tests: No results for input(s): "TSH", "T4TOTAL", "FREET4", "T3FREE", "THYROIDAB" in the last 72 hours. Anemia Panel: No results for input(s): "VITAMINB12", "FOLATE", "FERRITIN", "TIBC", "IRON", "RETICCTPCT" in the last 72 hours. Urine analysis:    Component Value Date/Time   COLORURINE YELLOW 11/17/2019 [place] 11/17/2019 1451   LABSPEC 1.027 11/17/2019 1451   PHURINE 6.0 11/17/2019 1451   GLUCOSEU NEGATIVE 11/17/2019 1451   [place] 11/17/2019 1451   BILIRUBINUR NEGATIVE 06/04/2019 1441   KETONESUR NEGATIVE 11/17/2019 1451   PROTEINUR NEGATIVE 11/17/2019 1451   UROBILINOGEN 0.2 06/01/2015 1145   NITRITE NEGATIVE 11/17/2019 1451   LEUKOCYTESUR NEGATIVE 11/17/2019 1451   Sepsis Labs: !!!!!!!!!!!!!!!!!!!!!!!!!!!!!!!!!!!!!!!!!!!! @LABRCNTIP (procalcitonin:4,lacticidven:4) ) Recent Results (from the past 240 hour(s))  Resp Panel by RT-PCR (Flu A&B, Covid) Anterior Nasal Swab     Status: None   Collection Time: 05/22/22  2:26 PM   Specimen: Anterior Nasal Swab  Result Value Ref Range Status   SARS Coronavirus 2 by RT PCR NEGATIVE NEGATIVE Final    Comment: (NOTE) SARS-CoV-2 target nucleic acids are NOT DETECTED.  The SARS-CoV-2 RNA is generally detectable in upper respiratory specimens during the acute phase of infection. The lowest concentration of SARS-CoV-2 viral copies this assay can detect is 138 copies/mL. A negative result does not preclude SARS-Cov-2 infection and should not be used as the sole basis for treatment or other patient management decisions. A negative result may occur with  improper specimen collection/handling, submission of specimen other than nasopharyngeal swab, presence of viral mutation(s) within the areas targeted by this assay, and inadequate number of viral copies(<138 copies/mL). A negative result must be combined with clinical observations, patient history, and epidemiological information. The  expected result is Negative.  Fact Sheet for Patients:  EntrepreneurPulse.com.au  Fact  Sheet for Healthcare Providers:  IncredibleEmployment.be  This test is no t yet approved or cleared by the [place] FDA and  has been authorized for detection and/or diagnosis of SARS-CoV-2 by FDA under an Emergency Use Authorization (EUA). This EUA will remain  in effect (meaning this test can be used) for the duration of the COVID-19 declaration under Section 564(b)(1) of the Act, 21 U.S.C.section 360bbb-3(b)(1), unless the authorization is terminated  or revoked sooner.       Influenza A by PCR NEGATIVE NEGATIVE Final   Influenza B by PCR NEGATIVE NEGATIVE Final    Comment: (NOTE) The Xpert Xpress SARS-CoV-2/FLU/RSV plus assay is intended as an aid in the diagnosis of influenza from Nasopharyngeal swab specimens and should not be used as a sole basis for treatment. Nasal washings and aspirates are unacceptable for Xpert Xpress SARS-CoV-2/FLU/RSV testing.  Fact Sheet for Patients: EntrepreneurPulse.com.au  Fact Sheet for Healthcare Providers: IncredibleEmployment.be  This test is not yet approved or cleared by the [place] FDA and has been authorized for detection and/or diagnosis of SARS-CoV-2 by FDA under an Emergency Use Authorization (EUA). This EUA will remain in effect (meaning this test can be used) for the duration of the COVID-19 declaration under Section 564(b)(1) of the Act, 21 U.S.C. section 360bbb-3(b)(1), unless the authorization is terminated or revoked.  Performed at [facility], [place] [address], [place], [place] [id number]      Radiological Exams on Admission: DG Chest Port 1 View  Result Date: 05/22/2022 CLINICAL DATA:  Dyspnea EXAM: PORTABLE CHEST 1 VIEW COMPARISON:  Chest a 01/2022 FINDINGS: Improved lung volumes. Improvement in bibasilar atelectasis/infiltrate.  Negative for heart failure. Small pleural effusions bilaterally. Chronic right rib fracture IMPRESSION: Improved lung volumes. Improvement in bibasilar atelectasis/infiltrate. Electronically Signed   By: Franchot Gallo M.D.   On: 05/22/2022 14:53    EKG: Independently reviewed. NSR with PVC and PAC  Assessment/Plan Principal Problem:   Acute on chronic respiratory failure with hypoxia (HCC) Active Problems:   Adenocarcinoma of lung (HCC)   Malignant neoplasm metastatic to brain (HCC)   COPD exacerbation (HCC)   Hypothyroidism   Acute on chronic hypoxemic respiratory failure -On 2-3L O2 at baseline and requiring up to 6L in ED  COPD exacerbation -IV steroids, doxycycline, breathing treatment  Elevated d-dimer -Age-adjusted d-dimer is 854microgram/L. 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 [name] -On osimirtenib therapy  Hypothyroidism -Synthroid  Mood disorder -Trileptal, Celexa, Xanax as needed   DVT prophylaxis: Subcutaneous heparin   Code Status: DNR, confirmed at time of admission with patient Family Communication: No family at bedside Disposition Plan: Pending clinical improvement, return back home Consults called: None Severity of Illness: The appropriate patient status for this patient is INPATIENT. Inpatient status is judged to be reasonable and necessary in order to provide the required intensity of service to ensure the patient's safety. The patient's presenting symptoms, physical exam findings, and initial radiographic and laboratory data in the context of their chronic comorbidities is felt to place them at high risk for further clinical deterioration. Furthermore, it is not anticipated that the patient will be medically stable for discharge from the hospital within 2 midnights of admission.   * I certify that at the point of admission it is my clinical judgment that the patient will require inpatient hospital care  spanning beyond 2 midnights from the point of admission due to high intensity of service, high risk for further deterioration  and high frequency of surveillance required.Dessa Phi, DO [facility] 05/22/2022, 6:01 PM   Available via Epic secure chat 7am-7pm After these hours, please refer to coverage provider listed on [email or web address]

## 2022-05-22 NOTE — ED Provider Notes (Signed)
[facility] DEPT Provider Note   CSN: [id number] Arrival date & time: 05/22/22  1255     History  Chief Complaint  Patient presents with   Shortness of Breath    [name] is a 81 y.o. female.   Shortness of Breath Patient has history of COPD, anxiety, hypothyroidism, irritable bowel, diverticulosis, trigeminal neuralgia Pt has hx of COPD.  She has been having trouble with her oxygen and shortness of breath over the lat month.  Pt has been having to increase her o2 level.  Today she had to increase to 6.  It has been dropping into the 80s despite increased oxygen.   Pt used to smoke.  Has hx of COPD and lung cancer.    Home Medications Prior to Admission medications   Medication Sig Start Date End Date Taking? Authorizing Provider  albuterol (VENTOLIN HFA) 108 (90 Base) MCG/ACT inhaler INHALE 2 PUFFS EVERY 6 HOURS AS NEEDED FOR WHEEZING OR SHORTNESS OF BREATH. 03/13/22   Orson Slick, MD  ALPRAZolam Duanne Moron) 0.5 MG tablet Take 1 tablet (0.5 mg total) by mouth 2 (two) times daily as needed for anxiety. TAKE (1) TABLET BY MOUTH TWICE A DAY AS NEEDED. 05/17/22   [name], MD  amoxicillin-clavulanate (AUGMENTIN) 875-125 MG tablet Take 1 tablet by mouth 2 (two) times daily. 05/08/22   [name], MD  azithromycin (ZITHROMAX) 250 MG tablet 2 tabs poqday1, 1 tab poqday 2-5 05/08/22   [name], MD  citalopram (CELEXA) 40 MG tablet TAKE (1/2) TABLET BY MOUTH AT BEDTIME. 05/17/22   [name], MD  [facility] ACETAMINOPHEN 325 MG tablet Take by mouth daily. 01/17/22   [provider]  denosumab (PROLIA) 60 MG/ML SOSY injection INJECT INTO UPPER ARM, THIGH, OR ABDOMEN ONCE. 06/07/21   [name], MD  dexamethasone (DECADRON) 4 MG tablet Take 1 tablet by mouth every day 04/13/22   [name], MD  diphenhydrAMINE (BENADRYL) 50 MG tablet Take 1 tablet (50 mg total) by mouth once for 1 dose. Take one hour prior to CT scan  06/20/20 04/13/21  Derek Jack, MD  Fluticasone-Umeclidin-Vilant (TRELEGY ELLIPTA) 100-62.5-25 MCG/ACT AEPB Inhale 1 puff into the lungs daily. 10/24/21   [name], MD  guaifenesin (HUMIBID E) 400 MG TABS tablet Take 400 mg by mouth 4 (four) times daily. 02/07/22   [provider]  ipratropium-albuterol (DUONEB) 0.5-2.5 (3) MG/3ML SOLN Take 3 mLs by nebulization every 6 (six) hours as needed. 05/08/22   [name], MD  levocetirizine Harlow Ohms) 5 MG tablet Take 1 tablet by mouth every day 03/18/22   [name], MD  levothyroxine (SYNTHROID) 50 MCG tablet Take 1 tablet by mouth before breakfast 03/18/22   [name], MD  Multiple Vitamin (MULTIVITAMIN WITH MINERALS) TABS tablet Take 1 tablet by mouth daily.    [provider]  ondansetron (ZOFRAN) 4 MG tablet TAKE 1 TABLET BY MOUTH 3 TIMES DAILY AS NEEDED FOR NAUSEA AND VOMITING. 05/05/21   [name], MD  osimertinib mesylate (TAGRISSO) 80 MG tablet TAKE 1 TABLET (80 MG TOTAL) BY MOUTH DAILY. 03/19/22 03/19/23  Orson Slick, MD  OVER THE COUNTER MEDICATION Bone Support 2-4 tabs daily    [provider]  OXcarbazepine (TRILEPTAL) 150 MG tablet Take 1 tablet by mouth twice daily 03/18/22   [name], MD  predniSONE (DELTASONE) 20 MG tablet Take 2 tablets (40 mg total) by mouth  daily with breakfast. 05/08/22   [name], MD      Allergies    Ciprofloxacin hcl, Sulfonamide derivatives, Ciprofloxacin, Iohexol, Iodinated contrast media, and Prozac [fluoxetine hcl]    Review of Systems   Review of Systems  Respiratory:  Positive for shortness of breath.     Physical Exam Updated Vital Signs BP 129/80   Pulse 77   Temp 97.6 F (36.4 C)   Resp (!) 21   Ht 1.549 m (5\' 1" )   Wt 42.8 kg   SpO2 96%   BMI 17.82 kg/m  Physical Exam Vitals and nursing note reviewed.  Constitutional:      Appearance: She is not diaphoretic.     Comments: Chronically ill-appearing,  underweight  HENT:     Head: Normocephalic and atraumatic.     Right Ear: External ear normal.     Left Ear: External ear normal.  Eyes:     General: No scleral icterus.       Right eye: No discharge.        Left eye: No discharge.     Conjunctiva/sclera: Conjunctivae normal.  Neck:     Trachea: No tracheal deviation.  Cardiovascular:     Rate and Rhythm: Normal rate.  Pulmonary:     Effort: Pulmonary effort is normal. No respiratory distress.     Breath sounds: No stridor. Wheezing and rales present.  Abdominal:     General: There is no distension.  Musculoskeletal:        General: No swelling or deformity.     Cervical back: Neck supple.  Skin:    General: Skin is warm and dry.     Findings: No rash.  Neurological:     Mental Status: She is alert.     Cranial Nerves: Cranial nerve deficit: no gross deficits.     ED Results / Procedures / Treatments   Labs (all labs ordered are listed, but only abnormal results are displayed) Labs Reviewed  COMPREHENSIVE METABOLIC PANEL - Abnormal; Notable for the following components:      Result Value   CO2 37 (*)    Glucose, Bld 109 (*)    Creatinine, Ser 0.42 (*)    Total Protein 6.4 (*)    Albumin 3.4 (*)    All other components within normal limits  CBC - Abnormal; Notable for the following components:   RBC 3.78 (*)    Hemoglobin 10.8 (*)    HCT 35.7 (*)    All other components within normal limits  D-DIMER, QUANTITATIVE - Abnormal; Notable for the following components:   D-Dimer, Quant 0.70 (*)    All other components within normal limits  RESP PANEL BY RT-PCR (FLU A&B, COVID) [id number]  BRAIN NATRIURETIC PEPTIDE    EKG EKG Interpretation  Date/Time:  Tuesday May 22 2022 15:21:11 EDT Ventricular Rate:  89 PR Interval:  141 QRS Duration: 95 QT Interval:  421 QTC Calculation: 483 R Axis:   -47 Text Interpretation: Sinus rhythm Multiple premature complexes, vent & supraven Left axis deviation Anterior infarct,  old Confirmed by Dorie Rank [phone]) on 05/22/2022 5:09:14 PM  Radiology DG Chest Port 1 View  Result Date: 05/22/2022 CLINICAL DATA:  Dyspnea EXAM: PORTABLE CHEST 1 VIEW COMPARISON:  Chest a 01/2022 FINDINGS: Improved lung volumes. Improvement in bibasilar atelectasis/infiltrate. Negative for heart failure. Small pleural effusions bilaterally. Chronic right rib fracture IMPRESSION: Improved lung volumes. Improvement in bibasilar atelectasis/infiltrate. Electronically Signed   By: Franchot Gallo M.D.  On: 05/22/2022 14:53    Procedures Procedures    Medications Ordered in ED Medications  albuterol (PROVENTIL) (2.5 MG/3ML) 0.083% nebulizer solution 2.5 mg (has no administration in time range)  methylPREDNISolone sodium succinate (SOLU-MEDROL) 125 mg/2 mL injection 125 mg (has no administration in time range)  ipratropium-albuterol (DUONEB) 0.5-2.5 (3) MG/3ML nebulizer solution 3 mL (3 mLs Nebulization Given 05/22/22 1455)    ED Course/ Medical Decision Making/ A&P Clinical Course as of 05/22/22 1724  Tue May 22, 2022  1653 Resp Panel by RT-PCR (Flu A&B, Covid) Anterior Nasal Swab COVID test negative [JK]  1653 Comprehensive metabolic panel(!) No significant abnormalities on metabolic panel [JK]  0881 D-dimer, quantitative(!) D-dimer is elevated but within age-adjusted normal range [JK]  1653 CBC(!) Anemia stable compared to previous values [JK]  1724 Case discussed with Dr. Maylene Roes [JK]    Clinical Course User Index [JK] Dorie Rank, MD                           Medical Decision Making Problems Addressed: COPD exacerbation [facility]): acute illness or injury that poses a threat to life or bodily functions  Amount and/or Complexity of Data Reviewed Labs: ordered. Decision-making details documented in ED Course. Radiology: ordered and independent interpretation performed.  Risk Prescription drug management. Decision regarding hospitalization.   Patient presented to the ED for  evaluation of shortness of breath.  Patient has known history of COPD.  She is on oxygen chronically but usually 2 to 3 L.  Patient has had increasing breathing difficulty.  She has required increasing oxygen supplementation.  Now using 6.  ED work-up does not show any signs of pneumonia.  D-dimer argues against pulmonary embolism.  No signs of anemia.  BMP is pending but she does not have evidence of pulmonary edema on chest x-ray.  I suspect this is a combination of her chronic lung disease with COPD exacerbation.  Patient has been treated with breathing treatments and steroids.  I will consult the medical service for admission        Final Clinical Impression(s) / ED Diagnoses Final diagnoses:  COPD exacerbation [facility])    Rx / DC Orders ED Discharge Orders     None         Dorie Rank, MD 05/22/22 1724

## 2022-05-23 DIAGNOSIS — J9621 Acute and chronic respiratory failure with hypoxia: Secondary | ICD-10-CM | POA: Diagnosis not present

## 2022-05-23 DIAGNOSIS — L8990 Pressure ulcer of unspecified site, unspecified stage: Secondary | ICD-10-CM | POA: Insufficient documentation

## 2022-05-23 MED ORDER — PREDNISONE 20 MG PO TABS
40.0000 mg | ORAL_TABLET | Freq: Every day | ORAL | Status: DC
  Administered 2022-05-23 – 2022-05-25 (×3): 40 mg via ORAL
  Filled 2022-05-23 (×3): qty 2

## 2022-05-23 NOTE — Plan of Care (Signed)
Pt has difficulty sleeping this shift. No other acute events.  Problem: Clinical Measurements: Goal: Ability to maintain clinical measurements within normal limits will improve Outcome: Progressing Goal: Will remain free from infection Outcome: Progressing Goal: Diagnostic test results will improve Outcome: Progressing Goal: Respiratory complications will improve Outcome: Progressing Goal: Cardiovascular complication will be avoided Outcome: Progressing   Problem: Activity: Goal: Risk for activity intolerance will decrease Outcome: Progressing   Problem: Nutrition: Goal: Adequate nutrition will be maintained Outcome: Progressing   Problem: Coping: Goal: Level of anxiety will decrease Outcome: Progressing   Problem: Elimination: Goal: Will not experience complications related to bowel motility Outcome: Progressing Goal: Will not experience complications related to urinary retention Outcome: Progressing   Problem: Pain Managment: Goal: General experience of comfort will improve Outcome: Progressing   Problem: Safety: Goal: Ability to remain free from injury will improve Outcome: Progressing   Problem: Skin Integrity: Goal: Risk for impaired skin integrity will decrease Outcome: Progressing

## 2022-05-23 NOTE — Progress Notes (Signed)
PROGRESS NOTE  DYNASTIE Sanford MCE:[id number] DOB: 11-13-1940 DOA: 05/22/2022 PCP: [name], MD   LOS: 1 day   Brief Narrative / Interim history: 81 year old female with history of lung cancer status postresection, with brain mets, chronic hypoxia with 2 L at home, COPD comes into the hospital for worsening shortness of breath.  She was recently seen at the beginning of August and completed prednisone and antibiotics for COPD exacerbation, but did not improve and eventually came to the hospital.  She was found to be more hypoxic than baseline requiring 6 L on admission  Subjective / 24h Interval events: Complains of persistent shortness of breath.  She is able only to take a few steps before having to stop.  Assesement and Plan: Principal Problem:   Acute on chronic respiratory failure with hypoxia (HCC) Active Problems:   Adenocarcinoma of lung (HCC)   Malignant neoplasm metastatic to brain (HCC)   COPD exacerbation (HCC)   Hypothyroidism   Pressure injury of skin  Principal problem Acute on chronic hypoxic respiratory failure in the setting of COPD exacerbation-she was initially placed on IV steroids, continue oral prednisone.  Still has some wheezing this morning.  Due to productive cough she has been placed on empiric doxycycline, continue.  Continue inhalers, nebulizers, supplemental oxygen.  Wean off to baseline 2 L as tolerated.  Active problems Metastatic lung cancer-continue home medications, follows with Dr. [name] as an outpatient.  Weakness-likely in the setting of #1.  PT to work with her  Elevated D-dimer-minimal, age-adjusted makes VTE unlikely  Hypothyroidism-continue Synthroid  Mood disorder-continue home medications   Scheduled Meds:  citalopram  20 mg Oral QHS   doxycycline  100 mg Oral Q12H   enoxaparin (LOVENOX) injection  30 mg Subcutaneous Q24H   feeding supplement  237 mL Oral BID BM   fluticasone furoate-vilanterol  1 puff Inhalation Daily    And   umeclidinium bromide  1 puff Inhalation Daily   levothyroxine  50 mcg Oral Q0600   loratadine  10 mg Oral Daily   OXcarbazepine  150 mg Oral BID   predniSONE  40 mg Oral Q breakfast   sodium chloride flush  3 mL Intravenous Q12H   Continuous Infusions:  sodium chloride     PRN Meds:.sodium chloride, acetaminophen **OR** acetaminophen, albuterol, ALPRAZolam, HYDROcodone-acetaminophen, ipratropium-albuterol, ondansetron **OR** ondansetron (ZOFRAN) IV, sodium chloride flush  Diet Orders (From admission, onward)     Start     Ordered   05/22/22 1827  DIET SOFT Room service appropriate? Yes; Fluid consistency: Thin  Diet effective now       Question Answer Comment  Room service appropriate? Yes   Fluid consistency: Thin      05/22/22 1826            DVT prophylaxis: enoxaparin (LOVENOX) injection 30 mg Start: 05/22/22 2000   Lab Results  Component Value Date   PLT 298 05/22/2022      Code Status: DNR  Family Communication: No family at bedside  Status is: Inpatient Remains inpatient appropriate because: Severity of illness   Level of care: Med-Surg   Objective: Vitals:   05/22/22 2232 05/23/22 0420 05/23/22 0748 05/23/22 0749  BP: 105/66 100/64    Pulse: 81 69    Resp: (!) 21 20    Temp: 98.3 F (36.8 C) 97.9 F (36.6 C)    TempSrc:      SpO2: 94% 98% 98% 98%  Weight:      Height:  Intake/Output Summary (Last 24 hours) at 05/23/2022 1121 Last data filed at 05/23/2022 0850 Gross per 24 hour  Intake 50 ml  Output --  Net 50 ml   Wt Readings from Last 3 Encounters:  05/22/22 42.8 kg  05/11/22 42.8 kg  05/10/22 42.2 kg    Examination:  Constitutional: NAD Eyes: no scleral icterus ENMT: Mucous membranes are moist.  Neck: normal, supple Respiratory: Diminished at the bases, overall distant sounds.  Faint end expiratory wheezing Cardiovascular: Regular rate and rhythm, no murmurs / rubs / gallops. No LE edema.  Abdomen: non distended,  no tenderness. Bowel sounds positive.  Musculoskeletal: no clubbing / cyanosis.  Skin: no rashes Neurologic: non focal   Data Reviewed: I have independently reviewed following labs and imaging studies   CBC Recent Labs  Lab 05/22/22 1426 05/22/22 1851  WBC 8.9 9.5  HGB 10.8* 11.4*  HCT 35.7* 37.4  PLT 316 298  MCV 94.4 93.3  MCH 28.6 28.4  MCHC 30.3 30.5  RDW 14.5 14.3    Recent Labs  Lab 05/22/22 1426 05/22/22 1500 05/22/22 1851  NA 140  --   --   K 3.9  --   --   CL 98  --   --   CO2 37*  --   --   GLUCOSE 109*  --   --   BUN 20  --   --   CREATININE 0.42*  --  0.45  CALCIUM 9.0  --   --   AST 16  --   --   ALT 29  --   --   ALKPHOS 58  --   --   BILITOT 0.3  --   --   ALBUMIN 3.4*  --   --   DDIMER 0.70*  --   --   BNP  --  62.2  --     ------------------------------------------------------------------------------------------------------------------ No results for input(s): "CHOL", "HDL", "LDLCALC", "TRIG", "CHOLHDL", "LDLDIRECT" in the last 72 hours.  Lab Results  Component Value Date   HGBA1C 5.1 03/08/2019   ------------------------------------------------------------------------------------------------------------------ No results for input(s): "TSH", "T4TOTAL", "T3FREE", "THYROIDAB" in the last 72 hours.  Invalid input(s): "FREET3"  Cardiac Enzymes No results for input(s): "CKMB", "TROPONINI", "MYOGLOBIN" in the last 168 hours.  Invalid input(s): "CK" ------------------------------------------------------------------------------------------------------------------    Component Value Date/Time   BNP 62.2 05/22/2022 1500    CBG: No results for input(s): "GLUCAP" in the last 168 hours.  Recent Results (from the past 240 hour(s))  Resp Panel by RT-PCR (Flu A&B, Covid) Anterior Nasal Swab     Status: None   Collection Time: 05/22/22  2:26 PM   Specimen: Anterior Nasal Swab  Result Value Ref Range Status   SARS Coronavirus 2 by RT PCR  NEGATIVE NEGATIVE Final    Comment: (NOTE) SARS-CoV-2 target nucleic acids are NOT DETECTED.  The SARS-CoV-2 RNA is generally detectable in upper respiratory specimens during the acute phase of infection. The lowest concentration of SARS-CoV-2 viral copies this assay can detect is 138 copies/mL. A negative result does not preclude SARS-Cov-2 infection and should not be used as the sole basis for treatment or other patient management decisions. A negative result may occur with  improper specimen collection/handling, submission of specimen other than nasopharyngeal swab, presence of viral mutation(s) within the areas targeted by this assay, and inadequate number of viral copies(<138 copies/mL). A negative result must be combined with clinical observations, patient history, and epidemiological information. The expected result is Negative.  Fact Sheet for  Patients:  EntrepreneurPulse.com.au  Fact Sheet for Healthcare Providers:  IncredibleEmployment.be  This test is no t yet approved or cleared by the [place] FDA and  has been authorized for detection and/or diagnosis of SARS-CoV-2 by FDA under an Emergency Use Authorization (EUA). This EUA will remain  in effect (meaning this test can be used) for the duration of the COVID-19 declaration under Section 564(b)(1) of the Act, 21 U.S.C.section 360bbb-3(b)(1), unless the authorization is terminated  or revoked sooner.       Influenza A by PCR NEGATIVE NEGATIVE Final   Influenza B by PCR NEGATIVE NEGATIVE Final    Comment: (NOTE) The Xpert Xpress SARS-CoV-2/FLU/RSV plus assay is intended as an aid in the diagnosis of influenza from Nasopharyngeal swab specimens and should not be used as a sole basis for treatment. Nasal washings and aspirates are unacceptable for Xpert Xpress SARS-CoV-2/FLU/RSV testing.  Fact Sheet for Patients: EntrepreneurPulse.com.au  Fact Sheet for  Healthcare Providers: IncredibleEmployment.be  This test is not yet approved or cleared by the [place] FDA and has been authorized for detection and/or diagnosis of SARS-CoV-2 by FDA under an Emergency Use Authorization (EUA). This EUA will remain in effect (meaning this test can be used) for the duration of the COVID-19 declaration under Section 564(b)(1) of the Act, 21 U.S.C. section 360bbb-3(b)(1), unless the authorization is terminated or revoked.  Performed at [facility], [place] [address], [place], [place] [address]      Radiology Studies: [facility] Chest Port 1 View  Result Date: 05/22/2022 CLINICAL DATA:  Dyspnea EXAM: PORTABLE CHEST 1 VIEW COMPARISON:  Chest a 01/2022 FINDINGS: Improved lung volumes. Improvement in bibasilar atelectasis/infiltrate. Negative for heart failure. Small pleural effusions bilaterally. Chronic right rib fracture IMPRESSION: Improved lung volumes. Improvement in bibasilar atelectasis/infiltrate. Electronically Signed   By: Franchot Gallo M.D.   On: 05/22/2022 14:53     Marzetta Board, MD, PhD [facility]  Between 7 am - 7 pm I am available, please contact me via Amion (for emergencies) or Securechat (non urgent messages)  Between 7 pm - 7 am I am not available, please contact night coverage MD/APP via Amion

## 2022-05-23 NOTE — Evaluation (Signed)
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[place])    corrected by surgery 08/21/13   Anxiety disorder    Aortic aneurysm ([place])    Brain metastasis    lung cancer s/p resection 7/20   Cancer ([place])    Phreesia 09/73/5329   Complication of anesthesia 2011   bloodpressure dropped during colonoscopy, not problems since   Compression fracture    COPD (chronic obstructive pulmonary disease) ([place])    Depression    Diastolic dysfunction    Diverticulosis    Dysphagia    Emphysema lung ([place]) 11/08/2014   Headache    Hiatal hernia    History of kidney stones    Hypothyroidism    IBS (irritable bowel syndrome)    Iron deficiency anemia due to chronic blood loss 12/05/2016   On home O2 12/15/2013   chronic hypoxia   Pneumonia    PUD (peptic ulcer disease)    56yrs   Shortness of breath    with exertion   SIADH (syndrome of inappropriate ADH production) ([place])    Trigeminal neuralgia    Past Surgical History:  Past Surgical History:  Procedure Laterality Date   ABDOMINAL HYSTERECTOMY     APPLICATION OF CRANIAL NAVIGATION N/A 03/18/2019   Procedure: APPLICATION OF CRANIAL NAVIGATION;  Surgeon: Consuella Lose, MD;  Location: [facility];  Service: Neurosurgery;  Laterality: N/A;   BACK SURGERY  January 13, 2014   BRAIN SURGERY N/A    Phreesia 03/28/2020   CATARACT EXTRACTION     CATARACT EXTRACTION W/PHACO  09/02/2012   Procedure: CATARACT EXTRACTION PHACO AND INTRAOCULAR LENS PLACEMENT (IOC);  Surgeon: Elta Guadeloupe T. 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 Oneida Alar: moderate diverticulosis, hemorrhoids, redundant colon. next TCS in 10-15 years.    CRANIOTOMY Left 03/18/2019   Procedure: Left stereotactic craniotomy for tumor resection;  Surgeon: Consuella Lose, MD;  Location: [facility];  Service: Neurosurgery;  Laterality: Left;  Left stereotactic craniotomy for tumor resection   ENDARTERECTOMY Right 08/21/2013   Procedure: RIGHT CAROTID ANEURYSM RESECTION;  Surgeon: Rosetta Posner, MD;  Location: [facility];  Service: Vascular;  Laterality: Right;   ESOPHAGEAL DILATION  02/24/2018   Procedure: ESOPHAGEAL DILATION;  Surgeon: Danie Binder, MD;  [facility];  Service: Endoscopy;;   ESOPHAGOGASTRODUODENOSCOPY  11/13/2009   w/dilation to 63mm, gastric ulceration (H.Pylori) s/p treatment   ESOPHAGOGASTRODUODENOSCOPY  11/21/2009   distal esophageal web, gastritis   ESOPHAGOGASTRODUODENOSCOPY  03/14/12   JME:[id number] in the distal esophagus/Mild gastritis/small HH. + H.pylori, prescribed Pylera. Finished treatment.    ESOPHAGOGASTRODUODENOSCOPY N/A 12/13/2017   Dr. Oneida Alar: esophageal web s/p dilation, gastritis, no h.pylori   ESOPHAGOGASTRODUODENOSCOPY N/A 02/24/2018   Dr. Oneida Alar: web in prox esophagus and in distal esophagus s/p dilation, mild gastritis, mild pyloric stenosis, mild post ulcer duodenal deformity at D1/D2   fatty tumor removal from lt groin     FRACTURE SURGERY  Left    hand and left ring finger   GIVENS CAPSULE STUDY N/A 12/26/2017   incomplete   GIVENS CAPSULE STUDY N/A 02/24/2018   normal small bowel   KNEE SURGERY     patella tendon repair june 2019 [facility]   LUNG CANCER SURGERY  12/2010   Left VATS, minithoracotomy, LLL superior segmentectomy   ORIF PATELLA Right 03/18/2018   Procedure: OPEN REDUCTION INTERNAL (ORIF) FIXATION RIGHT PATELLA;  Surgeon: Carole Civil, MD;  Location: [facility];   Service: Orthopedics;  Laterality: Right;   ORIF WRIST FRACTURE Left 04/09/2014   Procedure: OPEN REDUCTION INTERNAL FIXATION (ORIF) WRIST FRACTURE;  Surgeon: Renette Butters, MD;  Location: [facility];  Service: Orthopedics;  Laterality: Left;   PERCUTANEOUS PINNING Left 04/09/2014   Procedure: PERCUTANEOUS PINNING EXTREMITY;  Surgeon: Renette Butters, MD;  Location: [facility];  Service: Orthopedics;  Laterality: Left;   SAVORY DILATION N/A 12/13/2017   Procedure: SAVORY DILATION;  Surgeon: Danie Binder, MD;  [facility];  Service: Endoscopy;  Laterality: N/A;   TUBAL LIGATION     vocal cord surgery  02/06/2011   laryngoscopy with bilateral vocal cord Radiesse injection for vocal cord paralysis   YAG LASER APPLICATION Left 19/14/7829   Procedure: YAG LASER APPLICATION;  Surgeon: Rutherford Guys, MD;  Location: [facility];  Service: Ophthalmology;  Laterality: Left;   HPI:  [name] is a 81 y.o. female with medical history significant of chronic respiratory failure on 2L O2 at home, anxiety, CVA, COPD, hypothyroidism, history of lung cancer s/p resection with brain ments s/p left parietal craniotomy who presents with increased SOB.  Pt has h/o dysphagia with esophageal dysmotility and hiatal hernia diagnosed in 2013, mildly prominent CP diagnosed seen on MBS 819/2020.  Pt reported to nurse that she eats standing up due to her dysphagia, thus swallow eval ordered.   She also endorses recent stress from moving to [place] *staying with granddaughter* and then moving back and advises this has contributed to her current medical status.   Pt admits to weight loss and that she will occasionally reflux, expectorate food/drinks during intake if not "careful".    Assessment / Plan / Recommendation  Clinical Impression  Pt with known h/o minimal pharyngo-cervical esophageal dysphagia, prominent cp viewed on mbs 05/2019 without impacting barium clearance.  Observed her consuming intake including tea, Ensure,  and saltine crackers.  She is turning her head to the right with swallowing stating that it helps her to clear throat better.  Suspect pt's sensation to food lodging in her throat is referrant to distal esophageal issues/known dysmotility.  No s/s of aspiration noted and pt uses liquids to aid oral transiting of dry cracker.   Ms Arens is very aware of her swallowing deficits and compensates well by eating small amounts. She reports he dysphagia has been ongoing for years and is not better nor worse than normal.  She states if she is caeful, she can prevent herself from having food/drink "come back up" and expelling it.  Given her awareness and obvious caution with intake, recommend continue diet with precautions.  Reviewed prior MBS from 2020 and esophagram from 2013 with pt during session for educational purpose. SLP Visit Diagnosis: Dysphagia, pharyngoesophageal phase (R13.14);Dysphagia, unspecified (R13.10)    Aspiration Risk  Mild aspiration risk    Diet Recommendation Thin liquid;Regular   Liquid Administration via: Cup;Straw Medication Administration: Other (Comment) (as pt deems indicated) Supervision: Patient able to self feed Compensations: Slow  rate;Small sips/bites;Other (Comment) (small frequent meals) Postural Changes: Seated upright at 90 degrees;Remain upright for at least 30 minutes after po intake    Other  Recommendations      Recommendations for follow up therapy are one component of a multi-disciplinary discharge planning process, led by the attending physician.  Recommendations may be updated based on patient status, additional functional criteria and insurance authorization.  Follow up Recommendations No SLP follow up      Assistance Recommended at Discharge None  Functional Status Assessment Patient has not had a recent decline in their functional status  Frequency and Duration   N/a         Prognosis   N/a     Swallow Study   General HPI: [name] is a  81 y.o. female with medical history significant of chronic respiratory failure on 2L O2 at home, anxiety, CVA, COPD, hypothyroidism, history of lung cancer s/p resection with brain ments s/p left parietal craniotomy who presents with increased SOB.  Pt has h/o dysphagia with esophageal dysmotility and hiatal hernia diagnosed in 2013, mildly prominent CP diagnosed seen on MBS 819/2020.  Pt reported to nurse that she eats standing up due to her dysphagia, thus swallow eval ordered.   She also endorses recent stress from moving to [place] *staying with granddaughter* and then moving back and advises this has contributed to her current medical status.   Pt admits to weight loss and that she will occasionally reflux, expectorate food/drinks during intake if not "careful". Type of Study: Bedside Swallow Evaluation Diet Prior to this Study: Regular;Thin liquids Temperature Spikes Noted: No Respiratory Status: Nasal cannula History of Recent Intubation: No Behavior/Cognition: Alert;Cooperative;Pleasant mood Oral Cavity Assessment: Within Functional Limits Oral Care Completed by SLP: No Oral Cavity - Dentition: Adequate natural dentition Vision: Functional for self-feeding Self-Feeding Abilities: Able to feed self Patient Positioning: Upright in bed Baseline Vocal Quality: Low vocal intensity Volitional Cough: Wet    Oral/Motor/Sensory Function Overall Oral Motor/Sensory Function: Within functional limits (right eye ptosis noted which pt attributes to prior cva)   Ice Chips Ice chips: Not tested   Thin Liquid Thin Liquid: Within functional limits Presentation: Straw Other Comments: multiple swallows    Nectar Thick Nectar Thick Liquid: Within functional limits Presentation: Cup Other Comments: multiple swallows   Honey Thick Honey Thick Liquid: Not tested   Puree Puree: Not tested   Solid     Solid: Within functional limits Presentation: Self Fed Other Comments: patient uses liquids to aid  oral transiting      Macario Golds 05/23/2022,4:22 PM  Kathleen Lime, MS [facility] Office [phone] Pager [phone]

## 2022-05-23 NOTE — Progress Notes (Signed)
Pt drank an Ensure for dinner.

## 2022-05-23 NOTE — Evaluation (Signed)
Physical Therapy Evaluation Patient Details Name: [name] MRN: [id number] DOB: 1941-07-07 Today's Date: 05/23/2022  History of Present Illness  [name] is a 81 y.o. female with medical history significant of chronic respiratory failure on 2L O2 at home, anxiety, COPD, hypothyroidism, history of lung cancer s/p resection  with brain ments s/p left parietal craniotomy who presents with increased SOB.   Clinical Impression  [name] is 81 y.o. female admitted with above HPI and diagnosis. Patient is currently limited by functional impairments below (see PT problem list). Patient lives in an RV on her sons property and is independent at baseline for limited household mobility and activity. PTA pt was participating in [place] and was doing exercises and walking to improve endurance/strength. Currently she requires min assist for transfers and gait and 4L/min to maintain SpO2 of 90-95% during mobility. Patient will benefit from continued skilled PT interventions to address impairments and progress independence with mobility, recommending return home and continue HHPT. Acute PT will follow and progress as able.        Recommendations for follow up therapy are one component of a multi-disciplinary discharge planning process, led by the attending physician.  Recommendations may be updated based on patient status, additional functional criteria and insurance authorization.  Follow Up Recommendations Home health PT      Assistance Recommended at Discharge Intermittent Supervision/Assistance  Patient can return home with the following  A little help with walking and/or transfers;A little help with bathing/dressing/bathroom;Direct supervision/assist for medications management;Help with stairs or ramp for entrance;Assist for transportation    Equipment Recommendations None recommended by PT  Recommendations for Other Services       Functional Status Assessment Patient has had a recent  decline in their functional status and demonstrates the ability to make significant improvements in function in a reasonable and predictable amount of time.     Precautions / Restrictions Precautions Precautions: Fall Precaution Comments: watch O2 Restrictions Weight Bearing Restrictions: No      Mobility  Bed Mobility Overal bed mobility: Needs Assistance Bed Mobility: Supine to Sit     Supine to sit: Min guard, HOB elevated, Supervision     General bed mobility comments: guarding/supervision for safety, assist for O2 line management    Transfers Overall transfer level: Needs assistance Equipment used: Rolling walker (2 wheels) Transfers: Sit to/from Stand Sit to Stand: Min guard           General transfer comment: guarding to rise from EOB to RW.    Ambulation/Gait Ambulation/Gait assistance: Min guard, Min assist Gait Distance (Feet): 90 Feet Assistive device: Rolling walker (2 wheels) Gait Pattern/deviations: Step-through pattern, Decreased stride length, Decreased step length - left, Decreased step length - right, Trunk flexed Gait velocity: decr     General Gait Details: min assist at start for management of RW as pt typically uses rollator and is not used to a RW. pt imrpoved walker management as gait progressed and eventually min guard requried.  Stairs            Wheelchair Mobility    Modified Rankin (Stroke Patients Only)       Balance Overall balance assessment: Needs assistance Sitting-balance support: Feet supported Sitting balance-Leahy Scale: Good     Standing balance support: During functional activity, Reliant on assistive device for balance, Bilateral upper extremity supported Standing balance-Leahy Scale: Poor  Pertinent Vitals/Pain Pain Assessment Pain Assessment: No/denies pain    Home Living Family/patient expects to be discharged to:: Private residence Living Arrangements:  Alone Available Help at Discharge: Family (lives in [place] behind her sons home) Type of Home: Mobile home (RV) Home Access: Ramped entrance   Entrance Stairs-Number of Steps: church built a ramp   Home Layout: One level Home Equipment: Rollator (4 wheels)      Prior Function               Mobility Comments: uses rollator in home ADLs Comments: "dolly washes" down as far as possible and up as far as possible. cannot go back to granddaughter. Son does grocery shopping. Microwaves food only     Hand Dominance   Dominant Hand: Right    Extremity/Trunk Assessment   Upper Extremity Assessment Upper Extremity Assessment: Overall WFL for tasks assessed    Lower Extremity Assessment Lower Extremity Assessment: Defer to PT evaluation    Cervical / Trunk Assessment Cervical / Trunk Assessment: Kyphotic  Communication   Communication: HOH  Cognition Arousal/Alertness: Awake/alert Behavior During Therapy: WFL for tasks assessed/performed Overall Cognitive Status: Within Functional Limits for tasks assessed                                          General Comments General comments (skin integrity, edema, etc.): pt on 4L/min with gait, SpO2 90-95% during mobilty.    Exercises     Assessment/Plan    PT Assessment Patient needs continued PT services  PT Problem List Decreased strength;Decreased activity tolerance;Decreased balance;Decreased mobility;Decreased knowledge of use of DME;Decreased safety awareness;Decreased knowledge of precautions;Cardiopulmonary status limiting activity       PT Treatment Interventions DME instruction;Gait training;Stair training;Therapeutic activities;Functional mobility training;Therapeutic exercise;Balance training;Neuromuscular re-education;Patient/family education    PT Goals (Current goals can be found in the Care Plan section)  Acute Rehab PT Goals Patient Stated Goal: get home and stay independent PT Goal Formulation:  With patient Time For Goal Achievement: 06/06/22 Potential to Achieve Goals: Good    Frequency Min 3X/week     Co-evaluation               AM-PAC PT "6 Clicks" Mobility  Outcome Measure Help needed turning from your back to your side while in a flat bed without using bedrails?: A Little Help needed moving from lying on your back to sitting on the side of a flat bed without using bedrails?: A Little Help needed moving to and from a bed to a chair (including a wheelchair)?: A Little Help needed standing up from a chair using your arms (e.g., wheelchair or bedside chair)?: A Little Help needed to walk in hospital room?: A Little Help needed climbing 3-5 steps with a railing? : A Little 6 Click Score: 18    End of Session Equipment Utilized During Treatment: Gait belt;Oxygen Activity Tolerance: Patient tolerated treatment well Patient left: in chair;with call bell/phone within reach;with chair alarm set Nurse Communication: Mobility status PT Visit Diagnosis: Other abnormalities of gait and mobility (R26.89);Muscle weakness (generalized) (M62.81);Difficulty in walking, not elsewhere classified (R26.2)    Time: 4034-7425 PT Time Calculation (min) (ACUTE ONLY): 26 min   Charges:   PT Evaluation $PT Eval Low Complexity: 1 Low          Verner Mould, DPT Acute Rehabilitation Services Office [phone] Pager [phone]  05/23/22 1:09 PM

## 2022-05-23 NOTE — Evaluation (Signed)
Occupational Therapy Evaluation Patient Details Name: [name] MRN: [id number] DOB: August 17, 1941 Today's Date: 05/23/2022   History of Present Illness [name] is a 81 y.o. female with medical history significant of chronic respiratory failure on 2L O2 at home, anxiety, COPD, hypothyroidism, history of lung cancer s/p resection  with brain ments s/p left parietal craniotomy who presents with increased SOB.   Clinical Impression   [name] is an 81 year old woman who lives in an RV behind her son's house. She is grossly independent in the RV - using a rolling walker and able to perform her ADLs including a sponge bath. Reports increasing difficulty with activities including taking care of her dog.  She was receiving HH PT. Today patient requiring increased oxygenation at 4 L to maintain sats between 89-92% at rest and with ambulation. She is setup to min guard for activities and min guard for ambulation. Patient will benefit from skilled OT services while in hospital to improve deficits and learn compensatory strategies as needed in order to return to PLOF.       Recommendations for follow up therapy are one component of a multi-disciplinary discharge planning process, led by the attending physician.  Recommendations may be updated based on patient status, additional functional criteria and insurance authorization.   Follow Up Recommendations  Home health OT    Assistance Recommended at Discharge Frequent or constant Supervision/Assistance  Patient can return home with the following A little help with walking and/or transfers;A little help with bathing/dressing/bathroom;Assistance with cooking/housework;Help with stairs or ramp for entrance;Assist for transportation    Functional Status Assessment  Patient has had a recent decline in their functional status and/or demonstrates limited ability to make significant improvements in function in a reasonable and predictable amount of  time  Equipment Recommendations  None recommended by OT    Recommendations for Other Services       Precautions / Restrictions Precautions Precautions: Fall Precaution Comments: watch O2 Restrictions Weight Bearing Restrictions: No      Mobility Bed Mobility Overal bed mobility: Needs Assistance Bed Mobility: Supine to Sit     Supine to sit: Supervision     General bed mobility comments: monitored o2 sats with activity. no physical assistance to sit up.    Transfers Overall transfer level: Needs assistance Equipment used: Rolling walker (2 wheels)               General transfer comment: min guard to stand and ambulate with RW. Verbal cues on use of rolling walker as patient typically uses a rollator.      Balance Overall balance assessment: Mild deficits observed, not formally tested                                         ADL either performed or assessed with clinical judgement   ADL Overall ADL's : Needs assistance/impaired Eating/Feeding: Independent Eating/Feeding Details (indicate cue type and reason): reports she has to stand so that "food will go down and not get stuck" Grooming: Set up;Sitting   Upper Body Bathing: Set up;Sitting   Lower Body Bathing: Supervison/ safety;Sit to/from stand;Set up   Upper Body Dressing : Set up;Sitting   Lower Body Dressing: Set up;Sitting/lateral leans   Toilet Transfer: Min guard;BSC/3in1   Toileting- Clothing Manipulation and Hygiene: Sit to/from stand;Supervision/safety       Functional mobility during ADLs:  Min guard;Rolling walker (2 wheels)       Vision Patient Visual Report: No change from baseline Additional Comments: impaired right eye since stroke. Keeps right eye closed     Perception     Praxis      Pertinent Vitals/Pain Pain Assessment Pain Assessment: No/denies pain     Hand Dominance Right   Extremity/Trunk Assessment Upper Extremity Assessment Upper Extremity  Assessment: Overall WFL for tasks assessed   Lower Extremity Assessment Lower Extremity Assessment: Defer to PT evaluation   Cervical / Trunk Assessment Cervical / Trunk Assessment: Kyphotic   Communication Communication Communication: HOH   Cognition Arousal/Alertness: Awake/alert Behavior During Therapy: WFL for tasks assessed/performed Overall Cognitive Status: Within Functional Limits for tasks assessed                                       General Comments       Exercises     Shoulder Instructions      Home Living Family/patient expects to be discharged to:: Private residence Living Arrangements: Alone Available Help at Discharge: Family (lives in [place] behind her sons home) Type of Home: Mobile home (RV) Home Access: Ramped entrance Entrance Stairs-Number of Steps: church built a ramp   Home Layout: One level     Bathroom Shower/Tub: Teacher, early years/pre: Standard Bathroom Accessibility: Yes (can back in and sit down)   Home Equipment: Rollator (4 wheels)          Prior Functioning/Environment               Mobility Comments: uses rollator in home ADLs Comments: "dolly washes" down as far as possible and up as far as possible. cannot go back to granddaughter. Son does grocery shopping. Microwaves food only        OT Problem List: Decreased strength;Decreased activity tolerance;Impaired balance (sitting and/or standing);Cardiopulmonary status limiting activity;Decreased knowledge of use of DME or AE      OT Treatment/Interventions: Self-care/ADL training;Therapeutic exercise;DME and/or AE instruction;Therapeutic activities;Balance training;Patient/family education    OT Goals(Current goals can be found in the care plan section) Acute Rehab OT Goals Patient Stated Goal: improved endurance OT Goal Formulation: With patient Time For Goal Achievement: 06/06/22 Potential to Achieve Goals: Fair  OT Frequency: Min 2X/week     Co-evaluation PT/OT/SLP Co-Evaluation/Treatment: Yes (coeval)            AM-PAC OT "6 Clicks" Daily Activity     Outcome Measure Help from another person eating meals?: None Help from another person taking care of personal grooming?: A Little Help from another person toileting, which includes using toliet, bedpan, or urinal?: A Little Help from another person bathing (including washing, rinsing, drying)?: A Little Help from another person to put on and taking off regular upper body clothing?: A Little Help from another person to put on and taking off regular lower body clothing?: A Little 6 Click Score: 19   End of Session Equipment Utilized During Treatment: Gait belt;Rolling walker (2 wheels);Oxygen Nurse Communication: Mobility status  Activity Tolerance: Patient tolerated treatment well Patient left: in chair;with call bell/phone within reach  OT Visit Diagnosis: Muscle weakness (generalized) (M62.81)                Time: 9485-4627 OT Time Calculation (min): 29 min Charges:  OT General Charges $OT Visit: 1 Visit OT Evaluation $OT Eval Low Complexity: 1 Low  Derl Barrow, OTR/L [facility]  Office [phone] Pager: [phone] 05/23/2022, 11:26 AM

## 2022-05-23 NOTE — Progress Notes (Signed)
Initial Nutrition Assessment  DOCUMENTATION CODES:   Underweight  INTERVENTION:  - will order Ensure Plus High Protein BID, each supplement provides 350 kcal and 20 grams of protein.  - will order 1 tablet multivitamin with minerals/day.  - complete NFPE when feasible.    NUTRITION DIAGNOSIS:   Increased nutrient needs related to acute illness, chronic illness, cancer and cancer related treatments as evidenced by estimated needs.  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 Patient presented to the ED due to worsening shortness of breath. She was admitted due to acute on chronic respiratory failure with hypoxia.  Patient ate 100% of breakfast this AM (oatmeal and coffee--259 kcal and 5 grams protein).   Patient sleeping soundly with no visitors present at the time of RD visit. Did not attempt to wake patient so that she could rest. Patient is slender in appearance. No blanket or sheet on patient during visualization.  Weight yesterday was 94 lb and weight has been stable over the past 2.5 months. No information documented in the edema section of flow sheet.    Labs reviewed. Medications reviewed; 50 mcg oral synthroid/day, 40 mg oral deltasone/day.    NUTRITION - FOCUSED PHYSICAL EXAM:  Deferred to allow patient to rest.   Diet Order:   Diet Order             DIET SOFT Room service appropriate? Yes; Fluid consistency: Thin  Diet effective now                   EDUCATION NEEDS:   No education needs have been identified at this  time  Skin:  Skin Assessment: Skin Integrity Issues: Skin Integrity Issues:: Stage I, Stage II Stage I: sacrum Stage II: L elbow  Last BM:  PTA/unknown  Height:   Ht Readings from Last 1 Encounters:  05/22/22 5\' 1"  (1.549 m)    Weight:   Wt Readings from Last 1 Encounters:  05/22/22 42.8 kg     BMI:  Body mass index is 17.82 kg/m.  Estimated Nutritional Needs:  Kcal:  1675-1850 kcal Protein:  80-95 grams Fluid:  >/= 1.8 L/day      Jarome Matin, MS, RD, LDN, CNSC Registered Dietitian II Inpatient Clinical Nutrition RD pager # and on-call/weekend pager # available in [facility]

## 2022-05-24 DIAGNOSIS — J9621 Acute and chronic respiratory failure with hypoxia: Secondary | ICD-10-CM | POA: Diagnosis not present

## 2022-05-24 MED ORDER — OSIMERTINIB MESYLATE 80 MG PO TABS
80.0000 mg | ORAL_TABLET | Freq: Every day | ORAL | Status: DC
  Administered 2022-05-24 – 2022-05-25 (×2): 80 mg via ORAL

## 2022-05-24 MED ORDER — ARFORMOTEROL TARTRATE 15 MCG/2ML IN NEBU
15.0000 ug | INHALATION_SOLUTION | Freq: Two times a day (BID) | RESPIRATORY_TRACT | Status: DC
  Administered 2022-05-24 – 2022-05-26 (×5): 15 ug via RESPIRATORY_TRACT
  Filled 2022-05-24 (×6): qty 2

## 2022-05-24 MED ORDER — BUDESONIDE 0.25 MG/2ML IN SUSP
0.2500 mg | Freq: Two times a day (BID) | RESPIRATORY_TRACT | Status: DC
  Administered 2022-05-24 – 2022-05-26 (×5): 0.25 mg via RESPIRATORY_TRACT
  Filled 2022-05-24 (×5): qty 2

## 2022-05-24 NOTE — TOC Initial Note (Signed)
Transition of Care [facility]) - Initial/Assessment Note    Patient Details  Name: [name] MRN: [id number] Date of Birth: 29-Jul-1941  Transition of [facility]) CM/SW Contact:    Vassie Moselle, LCSW Phone Number: 05/24/2022, 10:59 AM  Clinical Narrative:                 Met with pt and confirmed plan to return home with HHPT/OT. Pt states she has had home health services in the past but, is unsure which agency she used. Pt reports having a rolling walker at home and denies having additional DME needs. Pt has been set up with HHPT/OT through [facility]. TOC will continue to follow for further needs.   Expected Discharge Plan: [place] Barriers to Discharge: No Barriers Identified   Patient Goals and CMS Choice Patient states their goals for this hospitalization and ongoing recovery are:: To return home   Choice offered to / list presented to : Patient  Expected Discharge Plan and Services Expected Discharge Plan: [place] In-house Referral: Clinical Social Work Discharge Planning Services: CM Consult Post Acute Care Choice: [place] arrangements for the past 2 months: Mobile Home                 DME Arranged: N/A DME Agency: NA       HH Arranged: OT, PT [place] Agency: [place] Date [place]: 05/24/22 Time [place]: 1 Representative spoke with at [place]: Amy  Prior Living Arrangements/Services Living arrangements for the past 2 months: Mobile Home Lives with:: Self Patient language and need for interpreter reviewed:: Yes Do you feel safe going back to the place where you live?: Yes      Need for Family Participation in Patient Care: No (Comment) Care giver support system in place?: No (comment) Current home services: DME Criminal Activity/Legal Involvement Pertinent to Current Situation/Hospitalization: No - Comment as needed  Activities of Daily Living Home Assistive  Devices/Equipment: Wheelchair ADL Screening (condition at time of admission) Patient's cognitive ability adequate to safely complete daily activities?: Yes Is the patient deaf or have difficulty hearing?: No Does the patient have difficulty seeing, even when wearing glasses/contacts?: No Does the patient have difficulty concentrating, remembering, or making decisions?: No Patient able to express need for assistance with ADLs?: Yes Does the patient have difficulty dressing or bathing?: Yes Independently performs ADLs?: No Bathing: Needs assistance Does the patient have difficulty walking or climbing stairs?: Yes Weakness of Legs: Both Weakness of Arms/Hands: Both  Permission Sought/Granted   Permission granted to share information with : No              Emotional Assessment Appearance:: Appears stated age Attitude/Demeanor/Rapport: Engaged, Gracious Affect (typically observed): Anxious Orientation: : Oriented to Self, Oriented to Place, Oriented to  Time, Oriented to Situation Alcohol / Substance Use: Not Applicable Psych Involvement: No (comment)  Admission diagnosis:  COPD exacerbation ([place]) [J44.1] Patient Active Problem List   Diagnosis Date Noted   Pressure injury of skin 05/23/2022   COPD exacerbation ([place]) 05/22/2022   Hypothyroidism 05/22/2022   Chronic low back pain 07/05/2021   Neck pain 07/05/2021   Knee pain, right 07/05/2021   Right hip pain 07/05/2021   Thoracic spine pain 07/05/2021   Abdominal bloating 01/13/2020   Primary cancer of left lower lobe of lung ([place]) 12/29/2019   Flatulence 11/17/2019   IBS (irritable bowel syndrome)    Acute hypoxemic respiratory  failure ([place]) 05/25/2019   Pharyngeal dysphagia 05/07/2019   Malignant neoplasm metastatic to brain [facility]) 04/17/2019   Brain tumor ([place]) 03/18/2019   Essential hypertension 03/12/2019   Brain metastases 03/06/2019   Neoplasm of brain ([place]) 03/06/2019   Cytotoxic brain edema ([place]) 03/04/2019    ICH (intracerebral hemorrhage) ([place]) - L parietal lobe 03/03/2019   Nontraumatic intracerebral hemorrhage, unspecified ([place]) 03/03/2019   Osteoporosis 05/16/2018   S/P ORIF (open reduction internal fixation) fracture right patella 03/18/18 03/26/2018   Other specified postprocedural states 03/26/2018   Cellulitis, wound, post-operative 03/22/2018   Fracture of right patella 03/18/2018   Normocytic anemia    Esophageal dysphagia 11/27/2017   Heme positive stool 11/27/2017   Nasal crusting 02/06/2017   Iron deficiency anemia 12/05/2016   SIADH (syndrome of inappropriate ADH production) ([place])    Polypharmacy 09/14/2016   Muscle weakness (generalized)    Diastolic dysfunction 43/88/8757   Syncope 09/10/2016   Rib pain on left side    Closed compression fracture of first lumbar vertebra, initial encounter 08/22/2016   Hypersomnia 02/16/2016   Acute bronchitis 02/16/2016   Exertional dyspnea 12/20/2015   HLD (hyperlipidemia) 11/23/2014   Change in bowel habits 11/10/2014   Abdominal pain, left lower quadrant 11/10/2014   Emphysema lung ([place]) 97/28/2060   Helicobacter pylori gastritis 07/30/2014   Spinal cord disease ([place]) 05/19/2014   Acute on chronic respiratory failure with hypoxia ([place]) 04/10/2014   Wrist fracture 04/09/2014   Occlusion and stenosis of carotid artery without mention of cerebral infarction 03/16/2014   T12 burst fracture ([place]) 01/13/2014   Burst fracture of thoracic vertebra ([place]) 12/31/2013   Constipation 12/14/2013   Acute respiratory failure with hypoxia ([place]) 12/14/2013   COPD (chronic obstructive pulmonary disease) ([place]) 12/14/2013   Compression fracture of thoracic vertebra ([place]) 12/14/2013   Hypoxia 12/13/2013   Compression fracture 12/13/2013   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[place]) 08/11/2013   AAA (abdominal aortic aneurysm) without rupture ([place]) 02/26/2012   Dyspepsia 02/11/2012   Aneurysm ([place]) 02/11/2012   Trigeminal neuralgia    Epigastric pain 02/21/2011    Dysphagia 02/21/2011   Adenocarcinoma of lung ([place]) 15/61/5379   HELICOBACTER PYLORI GASTRITIS, HX OF 12/30/2009   Personal history of other diseases of the digestive system 12/30/2009   PCP:  [name], MD Pharmacy:   [facility] [place], [place] - [address] [place] [address] Phone: [phone] Fax: [place], [place] [address] [place] [address] Phone: [phone] Fax: [phone]  [id number] - Closed - [place], [place] - [address] AT [facility] [address] [place] [address] Phone: [phone] Fax: [phone]     Social Determinants of Health (SDOH) Interventions    Readmission Risk Interventions    05/24/2022   10:56 AM  Readmission Risk Prevention Plan  Transportation Screening Complete  PCP or Specialist Appt within 5-7 Days Complete  Home Care Screening Complete  Medication Review (RN CM) Complete

## 2022-05-24 NOTE — Progress Notes (Signed)
Mobility Specialist - Progress Note   Pre-mobility: 94% SpO2 During mobility: 83% SpO2 Post-mobility: 87% SPO2   05/24/22 1436  Oxygen Therapy  O2 Device Nasal Cannula  O2 Flow Rate (L/min) 6 L/min  Mobility  Activity Ambulated with assistance in room  Level of Assistance Standby assist, set-up cues, supervision of patient - no hands on  Assistive Device Front wheel walker  Distance Ambulated (ft) 20 ft  Activity Response Tolerated well  $Mobility charge 1 Mobility   Pt was found in bed and agreeable to mobilize. Pt after ambulation returned to bed with all necessities in reach and NT in room.   Ferd Hibbs Mobility Specialist

## 2022-05-24 NOTE — Progress Notes (Signed)
PROGRESS NOTE  [name] WGY:[id number] DOB: 1940-10-30 DOA: 05/22/2022 PCP: [name], MD   LOS: 2 days   Brief Narrative / Interim history: 81 year old female with history of lung cancer status postresection, with brain mets, chronic hypoxia with 2 L at home, COPD comes into the hospital for worsening shortness of breath.  She was recently seen at the beginning of August and completed prednisone and antibiotics for COPD exacerbation, but did not improve and eventually came to the hospital.  She was found to be more hypoxic than baseline requiring 6 L on admission  Subjective / 24h Interval events: Complains of persistent shortness of breath.  She is able only to take a few steps before having to stop.  Assesement and Plan: Principal Problem:   Acute on chronic respiratory failure with hypoxia (HCC) Active Problems:   Adenocarcinoma of lung (HCC)   Malignant neoplasm metastatic to brain (HCC)   COPD exacerbation (HCC)   Hypothyroidism   Pressure injury of skin  Principal problem Acute on chronic hypoxic respiratory failure in the setting of COPD exacerbation-she was initially placed on IV steroids, continue oral prednisone.  Wheezing has improved.  Suspect that her COPD has progressed.  Probably benefit from outpatient pulmonology referral.  I do not see her PFTs to assess severity but her COPD does seem to be quite severe.  Continue nebs, supplemental oxygen.  Still on 5 L this morning.  Quite anxious and afraid to go home  Active problems Metastatic lung cancer-continue home medications, follows with Dr. [name] as an outpatient.  Weakness-likely in the setting of #1.  PT commencing home health  Elevated D-dimer-minimal, age-adjusted makes VTE unlikely  Hypothyroidism-continue Synthroid  Mood disorder-continue home medications   Scheduled Meds:  arformoterol  15 mcg Nebulization BID   budesonide (PULMICORT) nebulizer solution  0.25 mg Nebulization BID    citalopram  20 mg Oral QHS   doxycycline  100 mg Oral Q12H   enoxaparin (LOVENOX) injection  30 mg Subcutaneous Q24H   feeding supplement  237 mL Oral BID BM   levothyroxine  50 mcg Oral Q0600   loratadine  10 mg Oral Daily   osimertinib mesylate  80 mg Oral Daily   OXcarbazepine  150 mg Oral BID   predniSONE  40 mg Oral Q breakfast   sodium chloride flush  3 mL Intravenous Q12H   Continuous Infusions:  sodium chloride     PRN Meds:.sodium chloride, acetaminophen **OR** acetaminophen, ALPRAZolam, HYDROcodone-acetaminophen, ipratropium-albuterol, ondansetron **OR** ondansetron (ZOFRAN) IV, sodium chloride flush  Diet Orders (From admission, onward)     Start     Ordered   05/23/22 1516  Diet regular Room service appropriate? Yes; Fluid consistency: Thin  Diet effective now       Question Answer Comment  Room service appropriate? Yes   Fluid consistency: Thin      05/23/22 1515            DVT prophylaxis: enoxaparin (LOVENOX) injection 30 mg Start: 05/22/22 2000   Lab Results  Component Value Date   PLT 298 05/22/2022      Code Status: DNR  Family Communication: No family at bedside  Status is: Inpatient Remains inpatient appropriate because: Severity of illness   Level of care: Med-Surg   Objective: Vitals:   05/23/22 1238 05/23/22 2044 05/24/22 0503 05/24/22 0854  BP: 91/62 (!) 105/51 109/70   Pulse: 76 88 73   Resp: 17 (!) 21 19   Temp: 98.2 F (36.8 C)  98.1 F (36.7 C) 98.3 F (36.8 C)   TempSrc: Oral     SpO2: 96% 96% 97% 93%  Weight:      Height:        Intake/Output Summary (Last 24 hours) at 05/24/2022 1128 Last data filed at 05/24/2022 2458 Gross per 24 hour  Intake 118 ml  Output 850 ml  Net -732 ml    Wt Readings from Last 3 Encounters:  05/22/22 42.8 kg  05/11/22 42.8 kg  05/10/22 42.2 kg    Examination:  Constitutional: NAD Eyes: lids and conjunctivae normal, no scleral icterus ENMT: mmm Neck: normal, supple Respiratory:  clear to auscultation bilaterally, no wheezing, no crackles. Normal respiratory effort.  Cardiovascular: Regular rate and rhythm, no murmurs / rubs / gallops. No LE edema. Abdomen: soft, no distention, no tenderness. Bowel sounds positive.  Skin: no rashes Neurologic: no focal deficits, equal strength   Data Reviewed: I have independently reviewed following labs and imaging studies   CBC Recent Labs  Lab 05/22/22 1426 05/22/22 1851  WBC 8.9 9.5  HGB 10.8* 11.4*  HCT 35.7* 37.4  PLT 316 298  MCV 94.4 93.3  MCH 28.6 28.4  MCHC 30.3 30.5  RDW 14.5 14.3     Recent Labs  Lab 05/22/22 1426 05/22/22 1500 05/22/22 1851  NA 140  --   --   K 3.9  --   --   CL 98  --   --   CO2 37*  --   --   GLUCOSE 109*  --   --   BUN 20  --   --   CREATININE 0.42*  --  0.45  CALCIUM 9.0  --   --   AST 16  --   --   ALT 29  --   --   ALKPHOS 58  --   --   BILITOT 0.3  --   --   ALBUMIN 3.4*  --   --   DDIMER 0.70*  --   --   BNP  --  62.2  --      ------------------------------------------------------------------------------------------------------------------ No results for input(s): "CHOL", "HDL", "LDLCALC", "TRIG", "CHOLHDL", "LDLDIRECT" in the last 72 hours.  Lab Results  Component Value Date   HGBA1C 5.1 03/08/2019   ------------------------------------------------------------------------------------------------------------------ No results for input(s): "TSH", "T4TOTAL", "T3FREE", "THYROIDAB" in the last 72 hours.  Invalid input(s): "FREET3"  Cardiac Enzymes No results for input(s): "CKMB", "TROPONINI", "MYOGLOBIN" in the last 168 hours.  Invalid input(s): "CK" ------------------------------------------------------------------------------------------------------------------    Component Value Date/Time   BNP 62.2 05/22/2022 1500    CBG: No results for input(s): "GLUCAP" in the last 168 hours.  Recent Results (from the past 240 hour(s))  Resp Panel by RT-PCR (Flu  A&B, Covid) Anterior Nasal Swab     Status: None   Collection Time: 05/22/22  2:26 PM   Specimen: Anterior Nasal Swab  Result Value Ref Range Status   SARS Coronavirus 2 by RT PCR NEGATIVE NEGATIVE Final    Comment: (NOTE) SARS-CoV-2 target nucleic acids are NOT DETECTED.  The SARS-CoV-2 RNA is generally detectable in upper respiratory specimens during the acute phase of infection. The lowest concentration of SARS-CoV-2 viral copies this assay can detect is 138 copies/mL. A negative result does not preclude SARS-Cov-2 infection and should not be used as the sole basis for treatment or other patient management decisions. A negative result may occur with  improper specimen collection/handling, submission of specimen other than nasopharyngeal swab, presence of viral mutation(s)  within the areas targeted by this assay, and inadequate number of viral copies(<138 copies/mL). A negative result must be combined with clinical observations, patient history, and epidemiological information. The expected result is Negative.  Fact Sheet for Patients:  EntrepreneurPulse.com.au  Fact Sheet for Healthcare Providers:  IncredibleEmployment.be  This test is no t yet approved or cleared by the [place] FDA and  has been authorized for detection and/or diagnosis of SARS-CoV-2 by FDA under an Emergency Use Authorization (EUA). This EUA will remain  in effect (meaning this test can be used) for the duration of the COVID-19 declaration under Section 564(b)(1) of the Act, 21 U.S.C.section 360bbb-3(b)(1), unless the authorization is terminated  or revoked sooner.       Influenza A by PCR NEGATIVE NEGATIVE Final   Influenza B by PCR NEGATIVE NEGATIVE Final    Comment: (NOTE) The Xpert Xpress SARS-CoV-2/FLU/RSV plus assay is intended as an aid in the diagnosis of influenza from Nasopharyngeal swab specimens and should not be used as a sole basis for treatment.  Nasal washings and aspirates are unacceptable for Xpert Xpress SARS-CoV-2/FLU/RSV testing.  Fact Sheet for Patients: EntrepreneurPulse.com.au  Fact Sheet for Healthcare Providers: IncredibleEmployment.be  This test is not yet approved or cleared by the [place] FDA and has been authorized for detection and/or diagnosis of SARS-CoV-2 by FDA under an Emergency Use Authorization (EUA). This EUA will remain in effect (meaning this test can be used) for the duration of the COVID-19 declaration under Section 564(b)(1) of the Act, 21 U.S.C. section 360bbb-3(b)(1), unless the authorization is terminated or revoked.  Performed at [facility], [place] [address] [place], [place] [address]      Radiology Studies: No results found.   Marzetta Board, MD, PhD [facility]  Between 7 am - 7 pm I am available, please contact me via Amion (for emergencies) or Securechat (non urgent messages)  Between 7 pm - 7 am I am not available, please contact night coverage MD/APP via Amion

## 2022-05-25 ENCOUNTER — Other Ambulatory Visit (HOSPITAL_COMMUNITY): Payer: PPO

## 2022-05-25 ENCOUNTER — Inpatient Hospital Stay (HOSPITAL_COMMUNITY): Payer: PPO

## 2022-05-25 DIAGNOSIS — J9621 Acute and chronic respiratory failure with hypoxia: Secondary | ICD-10-CM | POA: Diagnosis not present

## 2022-05-25 LAB — CBC
HCT: 33.8 % — ABNORMAL LOW (ref 36.0–46.0)
Hemoglobin: 10.2 g/dL — ABNORMAL LOW (ref 12.0–15.0)
MCH: 28.6 pg (ref 26.0–34.0)
MCHC: 30.2 g/dL (ref 30.0–36.0)
MCV: 94.7 fL (ref 80.0–100.0)
Platelets: 285 10*3/uL (ref 150–400)
RBC: 3.57 MIL/uL — ABNORMAL LOW (ref 3.87–5.11)
RDW: 14.1 % (ref 11.5–15.5)
WBC: 7.6 10*3/uL (ref 4.0–10.5)
nRBC: 0 % (ref 0.0–0.2)

## 2022-05-25 LAB — BASIC METABOLIC PANEL
Anion gap: 6 (ref 5–15)
BUN: 23 mg/dL (ref 8–23)
CO2: 40 mmol/L — ABNORMAL HIGH (ref 22–32)
Calcium: 9.2 mg/dL (ref 8.9–10.3)
Chloride: 98 mmol/L (ref 98–111)
Creatinine, Ser: 0.46 mg/dL (ref 0.44–1.00)
GFR, Estimated: >60 mL/min (ref >60)
Glucose, Bld: 90 mg/dL (ref 70–99)
Potassium: 4 mmol/L (ref 3.5–5.1)
Sodium: 144 mmol/L (ref 135–145)

## 2022-05-25 MED ORDER — DIPHENHYDRAMINE HCL 25 MG PO CAPS: 50.0000 mg | ORAL_CAPSULE | Freq: Once | ORAL | Status: AC

## 2022-05-25 MED ORDER — DIPHENHYDRAMINE HCL 50 MG/ML IJ SOLN
50.0000 mg | Freq: Once | INTRAMUSCULAR | Status: AC
Start: 2022-05-25 — End: 2022-05-25
  Administered 2022-05-25: 50 mg via INTRAVENOUS
  Filled 2022-05-25: qty 1

## 2022-05-25 MED ORDER — IOHEXOL 350 MG/ML SOLN
80.0000 mL | Freq: Once | INTRAVENOUS | Status: AC | PRN
  Administered 2022-05-25: 80 mL via INTRAVENOUS

## 2022-05-25 MED ORDER — PREDNISONE 50 MG PO TABS: 50.0000 mg | ORAL_TABLET | Freq: Four times a day (QID) | ORAL | Status: DC

## 2022-05-25 MED ORDER — POLYVINYL ALCOHOL 1.4 % OP SOLN
1.0000 [drp] | OPHTHALMIC | Status: DC | PRN
  Administered 2022-05-25: 1 [drp] via OPHTHALMIC
  Filled 2022-05-25: qty 15

## 2022-05-25 MED ORDER — BISACODYL 10 MG RE SUPP
10.0000 mg | Freq: Once | RECTAL | Status: AC
  Administered 2022-05-25: 10 mg via RECTAL
  Filled 2022-05-25: qty 1

## 2022-05-25 MED ORDER — DOCUSATE SODIUM 50 MG PO CAPS
50.0000 mg | ORAL_CAPSULE | Freq: Once | ORAL | Status: AC
Start: 2022-05-25 — End: 2022-05-25
  Administered 2022-05-25: 50 mg via ORAL
  Filled 2022-05-25: qty 1

## 2022-05-25 MED ORDER — PREDNISONE 50 MG PO TABS
50.0000 mg | ORAL_TABLET | Freq: Four times a day (QID) | ORAL | Status: AC
  Administered 2022-05-25 (×2): 50 mg via ORAL
  Filled 2022-05-25 (×2): qty 1

## 2022-05-25 NOTE — Progress Notes (Signed)
PROGRESS NOTE  [name]:[id number] DOB: 1940/12/22 DOA: 05/22/2022 PCP: [name], MD   LOS: 3 days   Brief Narrative / Interim history: 81 year old female with history of lung cancer status postresection, with brain mets, chronic hypoxia with 2 L at home, COPD comes into the hospital for worsening shortness of breath.  She was recently seen at the beginning of August and completed prednisone and antibiotics for COPD exacerbation, but did not improve and eventually came to the hospital.  She was found to be more hypoxic than baseline requiring 6 L on admission  Subjective / 24h Interval events: Remains very dyspneic with minimal DVTs.  Complains of persistent anxiety.  Assesement and Plan: Principal Problem:   Acute on chronic respiratory failure with hypoxia (HCC) Active Problems:   Adenocarcinoma of lung (HCC)   Malignant neoplasm metastatic to brain (HCC)   COPD exacerbation (HCC)   Hypothyroidism   Pressure injury of skin  Principal problem Acute on chronic hypoxic respiratory failure in the setting of COPD exacerbation-she was initially placed on IV steroids, continue oral prednisone.  Wheezing has improved.  Had a set of old PFTs back in 2017, FEV1 over FVC was 40%.  This is likely has progressed.  Pulmonology consulted, evaluated patient while hospitalized.  Given lack of improvement will obtain a CT of the chest.  Remains hypoxic on 5 L.  Palliative care also consulted  Active problems Metastatic lung cancer-continue home medications, follows with Dr. [name] as an outpatient.  Weakness-likely in the setting of #1.  PT commencing home health  Elevated D-dimer-minimal, age-adjusted makes VTE unlikely, however will obtain a CT angiogram as above  Hypothyroidism-continue Synthroid  Mood disorder-continue home medications   Scheduled Meds:  arformoterol  15 mcg Nebulization BID   budesonide (PULMICORT) nebulizer solution  0.25 mg Nebulization BID    citalopram  20 mg Oral QHS   diphenhydrAMINE  50 mg Oral Once   Or   diphenhydrAMINE  50 mg Intravenous Once   doxycycline  100 mg Oral Q12H   enoxaparin (LOVENOX) injection  30 mg Subcutaneous Q24H   feeding supplement  237 mL Oral BID BM   levothyroxine  50 mcg Oral Q0600   loratadine  10 mg Oral Daily   osimertinib mesylate  80 mg Oral Daily   OXcarbazepine  150 mg Oral BID   predniSONE  50 mg Oral Q6H   sodium chloride flush  3 mL Intravenous Q12H   Continuous Infusions:  sodium chloride     PRN Meds:.sodium chloride, acetaminophen **OR** acetaminophen, ALPRAZolam, HYDROcodone-acetaminophen, ipratropium-albuterol, ondansetron **OR** ondansetron (ZOFRAN) IV, polyvinyl alcohol, sodium chloride flush  Diet Orders (From admission, onward)     Start     Ordered   05/23/22 1516  Diet regular Room service appropriate? Yes; Fluid consistency: Thin  Diet effective now       Question Answer Comment  Room service appropriate? Yes   Fluid consistency: Thin      05/23/22 1515            DVT prophylaxis: enoxaparin (LOVENOX) injection 30 mg Start: 05/22/22 2000   Lab Results  Component Value Date   PLT 285 05/25/2022      Code Status: DNR  Family Communication: No family at bedside  Status is: Inpatient Remains inpatient appropriate because: Severity of illness   Level of care: Med-Surg   Objective: Vitals:   05/24/22 1243 05/24/22 2135 05/25/22 0409 05/25/22 0845  BP: 97/84 107/66 122/79   Pulse: 86  81 71   Resp: 18 20 20    Temp: 98 F (36.7 C) 97.6 F (36.4 C) (!) 97.5 F (36.4 C)   TempSrc: Oral  Oral   SpO2: 96% 93% 95% 90%  Weight:      Height:        Intake/Output Summary (Last 24 hours) at 05/25/2022 1233 Last data filed at 05/25/2022 0900 Gross per 24 hour  Intake 240 ml  Output --  Net 240 ml    Wt Readings from Last 3 Encounters:  05/22/22 42.8 kg  05/11/22 42.8 kg  05/10/22 42.2 kg    Examination:  Constitutional: NAD Eyes: lids  and conjunctivae normal, no scleral icterus ENMT: mmm Neck: normal, supple Respiratory: Distant breath sounds, faint rhonchi bilaterally.  No wheezing.  Cardiovascular: Regular rate and rhythm, no murmurs / rubs / gallops. No LE edema. Abdomen: soft, no distention, no tenderness. Bowel sounds positive.  Skin: no rashes Neurologic: no focal deficits, equal strength   Data Reviewed: I have independently reviewed following labs and imaging studies   CBC Recent Labs  Lab 05/22/22 1426 05/22/22 1851 05/25/22 0521  WBC 8.9 9.5 7.6  HGB 10.8* 11.4* 10.2*  HCT 35.7* 37.4 33.8*  PLT 316 298 285  MCV 94.4 93.3 94.7  MCH 28.6 28.4 28.6  MCHC 30.3 30.5 30.2  RDW 14.5 14.3 14.1     Recent Labs  Lab 05/22/22 1426 05/22/22 1500 05/22/22 1851 05/25/22 0521  NA 140  --   --  144  K 3.9  --   --  4.0  CL 98  --   --  98  CO2 37*  --   --  40*  GLUCOSE 109*  --   --  90  BUN 20  --   --  23  CREATININE 0.42*  --  0.45 0.46  CALCIUM 9.0  --   --  9.2  AST 16  --   --   --   ALT 29  --   --   --   ALKPHOS 58  --   --   --   BILITOT 0.3  --   --   --   ALBUMIN 3.4*  --   --   --   DDIMER 0.70*  --   --   --   BNP  --  62.2  --   --      ------------------------------------------------------------------------------------------------------------------ No results for input(s): "CHOL", "HDL", "LDLCALC", "TRIG", "CHOLHDL", "LDLDIRECT" in the last 72 hours.  Lab Results  Component Value Date   HGBA1C 5.1 03/08/2019   ------------------------------------------------------------------------------------------------------------------ No results for input(s): "TSH", "T4TOTAL", "T3FREE", "THYROIDAB" in the last 72 hours.  Invalid input(s): "FREET3"  Cardiac Enzymes No results for input(s): "CKMB", "TROPONINI", "MYOGLOBIN" in the last 168 hours.  Invalid input(s):  "CK" ------------------------------------------------------------------------------------------------------------------    Component Value Date/Time   BNP 62.2 05/22/2022 1500    CBG: No results for input(s): "GLUCAP" in the last 168 hours.  Recent Results (from the past 240 hour(s))  Resp Panel by RT-PCR (Flu A&B, Covid) Anterior Nasal Swab     Status: None   Collection Time: 05/22/22  2:26 PM   Specimen: Anterior Nasal Swab  Result Value Ref Range Status   SARS Coronavirus 2 by RT PCR NEGATIVE NEGATIVE Final    Comment: (NOTE) SARS-CoV-2 target nucleic acids are NOT DETECTED.  The SARS-CoV-2 RNA is generally detectable in upper respiratory specimens during the acute phase of infection. The lowest concentration of SARS-CoV-2  viral copies this assay can detect is 138 copies/mL. A negative result does not preclude SARS-Cov-2 infection and should not be used as the sole basis for treatment or other patient management decisions. A negative result may occur with  improper specimen collection/handling, submission of specimen other than nasopharyngeal swab, presence of viral mutation(s) within the areas targeted by this assay, and inadequate number of viral copies(<138 copies/mL). A negative result must be combined with clinical observations, patient history, and epidemiological information. The expected result is Negative.  Fact Sheet for Patients:  EntrepreneurPulse.com.au  Fact Sheet for Healthcare Providers:  IncredibleEmployment.be  This test is no t yet approved or cleared by the [place] FDA and  has been authorized for detection and/or diagnosis of SARS-CoV-2 by FDA under an Emergency Use Authorization (EUA). This EUA will remain  in effect (meaning this test can be used) for the duration of the COVID-19 declaration under Section 564(b)(1) of the Act, 21 U.S.C.section 360bbb-3(b)(1), unless the authorization is terminated  or  revoked sooner.       Influenza A by PCR NEGATIVE NEGATIVE Final   Influenza B by PCR NEGATIVE NEGATIVE Final    Comment: (NOTE) The Xpert Xpress SARS-CoV-2/FLU/RSV plus assay is intended as an aid in the diagnosis of influenza from Nasopharyngeal swab specimens and should not be used as a sole basis for treatment. Nasal washings and aspirates are unacceptable for Xpert Xpress SARS-CoV-2/FLU/RSV testing.  Fact Sheet for Patients: EntrepreneurPulse.com.au  Fact Sheet for Healthcare Providers: IncredibleEmployment.be  This test is not yet approved or cleared by the [place] FDA and has been authorized for detection and/or diagnosis of SARS-CoV-2 by FDA under an Emergency Use Authorization (EUA). This EUA will remain in effect (meaning this test can be used) for the duration of the COVID-19 declaration under Section 564(b)(1) of the Act, 21 U.S.C. section 360bbb-3(b)(1), unless the authorization is terminated or revoked.  Performed at [facility], [place] [address], [place], [place] [address]      Radiology Studies: No results found.   Marzetta Board, MD, PhD [facility]  Between 7 am - 7 pm I am available, please contact me via Amion (for emergencies) or Securechat (non urgent messages)  Between 7 pm - 7 am I am not available, please contact night coverage MD/APP via Amion

## 2022-05-25 NOTE — Plan of Care (Signed)
Pt requests a laxative to be added to [facility]. Problem: Education: Goal: Knowledge of General Education information will improve Description: Including pain rating scale, medication(s)/side effects and non-pharmacologic comfort measures Outcome: Progressing   Problem: Health Behavior/Discharge Planning: Goal: Ability to manage health-related needs will improve Outcome: Progressing   Problem: Clinical Measurements: Goal: Ability to maintain clinical measurements within normal limits will improve Outcome: Progressing Goal: Will remain free from infection Outcome: Progressing Goal: Diagnostic test results will improve Outcome: Progressing Goal: Respiratory complications will improve Outcome: Progressing Goal: Cardiovascular complication will be avoided Outcome: Progressing   Problem: Activity: Goal: Risk for activity intolerance will decrease Outcome: Progressing   Problem: Nutrition: Goal: Adequate nutrition will be maintained Outcome: Progressing   Problem: Elimination: Goal: Will not experience complications related to bowel motility Outcome: Progressing Goal: Will not experience complications related to urinary retention Outcome: Progressing   Problem: Pain Managment: Goal: General experience of comfort will improve Outcome: Progressing   Problem: Safety: Goal: Ability to remain free from injury will improve Outcome: Progressing   Problem: Skin Integrity: Goal: Risk for impaired skin integrity will decrease Outcome: Progressing   Problem: Education: Goal: Knowledge of disease or condition will improve Outcome: Progressing Goal: Knowledge of the prescribed therapeutic regimen will improve Outcome: Progressing Goal: Individualized Educational Video(s) Outcome: Progressing   Problem: Activity: Goal: Ability to tolerate increased activity will improve Outcome: Progressing Goal: Will verbalize the importance of balancing activity with adequate rest periods Outcome:  Progressing   Problem: Respiratory: Goal: Ability to maintain a clear airway will improve Outcome: Progressing Goal: Levels of oxygenation will improve Outcome: Progressing Goal: Ability to maintain adequate ventilation will improve Outcome: Progressing   Problem: Education: Goal: Knowledge of disease or condition will improve Outcome: Progressing Goal: Knowledge of the prescribed therapeutic regimen will improve Outcome: Progressing Goal: Individualized Educational Video(s) Outcome: Progressing   Problem: Activity: Goal: Ability to tolerate increased activity will improve Outcome: Progressing Goal: Will verbalize the importance of balancing activity with adequate rest periods Outcome: Progressing   Problem: Respiratory: Goal: Ability to maintain a clear airway will improve Outcome: Progressing Goal: Levels of oxygenation will improve Outcome: Progressing Goal: Ability to maintain adequate ventilation will improve Outcome: Progressing

## 2022-05-25 NOTE — Progress Notes (Signed)
Physical Therapy Treatment Patient Details Name: [name] MRN: [id number] DOB: Feb 09, 1941 Today's Date: 05/25/2022   History of Present Illness [name] is a 81 y.o. female with medical history significant of chronic respiratory failure on 2L O2 at home, anxiety, COPD, hypothyroidism, history of lung cancer s/p resection  with brain ments s/p left parietal craniotomy who presents with increased SOB.    PT Comments    General Comments: AxO x 3 cooperative but also present with anxiety and admitted to fear "when I can't breath". Pt uses Home oxygen at 2 lts.  Currently in bed on 5 lts with sats 96%.  Assisted OOB to amb in hallway trial 2 lts.  General bed mobility comments: self able with increased time and did require a brief rest break once EOB. General transfer comment: self able with good use B UE's to steady self.  Does sit quickly due to fatigue/dyspnea. General Gait Details: tendancy to push walker too far front but pt typically uses a Rollator at home.  Posture is poor and forward flex.  Distance limited by fatigue/dyspnea 3/4.  Trial amb on 2 lts which is her base line.  Sats decreased to 85/86%.  Pt required 4 lts to achieve sats >88%. At rest 3 lts sats 90%.  Assisted back to bed pt required an extended rest break while performing purse lip breathing. Pt unable to wean down to her prior 2 lts oxygen.      Recommendations for follow up therapy are one component of a multi-disciplinary discharge planning process, led by the attending physician.  Recommendations may be updated based on patient status, additional functional criteria and insurance authorization.  Follow Up Recommendations  Home health PT     Assistance Recommended at Discharge Intermittent Supervision/Assistance  Patient can return home with the following A little help with walking and/or transfers;A little help with bathing/dressing/bathroom;Direct supervision/assist for medications management;Help with stairs or  ramp for entrance;Assist for transportation   Equipment Recommendations  None recommended by PT    Recommendations for Other Services       Precautions / Restrictions Precautions Precautions: Fall Precaution Comments: monitor O2 Restrictions Weight Bearing Restrictions: No     Mobility  Bed Mobility Overal bed mobility: Modified Independent             General bed mobility comments: self able with increased time and did require a brief rest break once EOB.    Transfers Overall transfer level: Needs assistance Equipment used: Rolling walker (2 wheels) Transfers: Sit to/from Stand Sit to Stand: Supervision, Min guard           General transfer comment: self able with good use B UE's to steady self.  Does sit quickly due to fatigue/dyspnea.    Ambulation/Gait Ambulation/Gait assistance: Supervision, Min guard Gait Distance (Feet): 65 Feet Assistive device: Rolling walker (2 wheels) Gait Pattern/deviations: Step-through pattern, Decreased stride length, Decreased step length - left, Decreased step length - right, Trunk flexed Gait velocity: decreased     General Gait Details: tendancy to push walker too far front but pt typically uses a Rollator at home.  Posture is poor and forward flex.  Distance limited by fatigue/dyspnea 3/4.  Trial amb on 2 lts which is her base line.  Sats decreased to 85/86%.  Pt required 4 lts to achieve sats >88%.   Stairs             Wheelchair Mobility    Modified Rankin (Stroke Patients Only)  Balance                                            Cognition Arousal/Alertness: Awake/alert Behavior During Therapy: WFL for tasks assessed/performed Overall Cognitive Status: Within Functional Limits for tasks assessed                                 General Comments: AxO x 3 cooperative but also present with anxiety and admitted to fear "when I can't breath".        Exercises       General Comments        Pertinent Vitals/Pain      Home Living                          Prior Function            PT Goals (current goals can now be found in the care plan section) Progress towards PT goals: Progressing toward goals    Frequency    Min 3X/week      PT Plan Current plan remains appropriate    Co-evaluation              AM-PAC PT "6 Clicks" Mobility   Outcome Measure  Help needed turning from your back to your side while in a flat bed without using bedrails?: A Little Help needed moving from lying on your back to sitting on the side of a flat bed without using bedrails?: A Little Help needed moving to and from a bed to a chair (including a wheelchair)?: A Little Help needed standing up from a chair using your arms (e.g., wheelchair or bedside chair)?: A Little Help needed to walk in hospital room?: A Little Help needed climbing 3-5 steps with a railing? : A Little 6 Click Score: 18    End of Session Equipment Utilized During Treatment: Gait belt;Oxygen Activity Tolerance: Patient tolerated treatment well Patient left: in bed;with call bell/phone within reach;with bed alarm set Nurse Communication: Mobility status PT Visit Diagnosis: Other abnormalities of gait and mobility (R26.89);Muscle weakness (generalized) (M62.81);Difficulty in walking, not elsewhere classified (R26.2)     Time: 1751-0258 PT Time Calculation (min) (ACUTE ONLY): 23 min  Charges:  $Gait Training: 8-22 mins $Therapeutic Activity: 8-22 mins                     {Cortney Beissel  PTA Acute  [employer] M-F          [phone] Weekend pager [phone]

## 2022-05-25 NOTE — Consult Note (Signed)
NAME:  ARDELIA WREDE, MRN:  [id number], DOB:  1940-10-22, LOS: 3 ADMISSION DATE:  05/22/2022, CONSULTATION DATE:  05/25/2022 REFERRING MD:  Dr Cruzita Lederer, CHIEF COMPLAINT:  SOB   History of Present Illness:  Asked to see patient for COPD exacerbation acute on chronic respiratory failure Presented with worsening shortness of breath, worsening frailty  Underlying history of chronic obstructive pulmonary disease Shortness of breath with decrease in exercise tolerance Worsening oxygen requirement Past history of lung cancer for which she had surgery-left lower lobe resection Brain metastasis in 2020 for which she had stereotactic radiation treatment  Pertinent  Medical History   Past Medical History:  Diagnosis Date   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   left lung/surg only   Allergic rhinitis    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[place])    corrected by surgery 08/21/13   Anxiety disorder    Aortic aneurysm ([place])    Brain metastasis    lung cancer s/p resection 7/20   Cancer ([place])    Phreesia 83/15/1761   Complication of anesthesia 2011   bloodpressure dropped during colonoscopy, not problems since   Compression fracture    COPD (chronic obstructive pulmonary disease) (HCC)    Depression    Diastolic dysfunction    Diverticulosis    Dysphagia    Emphysema lung ([place]) 11/08/2014   Headache    Hiatal hernia    History of kidney stones    Hypothyroidism    IBS (irritable bowel syndrome)    Iron deficiency anemia due to chronic blood loss 12/05/2016   On home O2 12/15/2013   chronic hypoxia   Pneumonia    PUD (peptic ulcer disease)    65yrs   Shortness of breath    with exertion   SIADH (syndrome of inappropriate ADH production) (HCC)    Trigeminal neuralgia      Significant Hospital Events: Including procedures, antibiotic start and stop dates in addition to other pertinent events   Admitted 814 with worsening shortness of breath  Interim History / Subjective:  Admits to  progressive worsening of exercise tolerance Occasional cough, clear phlegm No fevers, no chills  Objective   Blood pressure 122/79, pulse 71, temperature (!) 97.5 F (36.4 C), temperature source Oral, resp. rate 20, height 5\' 1"  (1.549 m), weight 42.8 kg, SpO2 90 %.        Intake/Output Summary (Last 24 hours) at 05/25/2022 6073 Last data filed at 05/25/2022 0900 Gross per 24 hour  Intake 240 ml  Output --  Net 240 ml   Filed Weights   05/22/22 1304  Weight: 42.8 kg    Examination: General: Elderly, appears very frail HENT: Moist oral mucosa Lungs: Decreased air movement bilaterally, no rales Cardiovascular: S1-S2 appreciated Abdomen: Soft, bowel sounds appreciated Extremities: No edema Neuro: Alert and oriented, moving all extremities GU:   Last PFT from 2017 reviewed by myself showing severe obstructive disease, severely reduced diffusing capacity  Last echocardiogram 03/07/2019-normal ejection fraction, previous echocardiogram from a year before had shown mild pulmonary hypertension  CT chest 05/17/2021 with advanced extensive emphysematous changes Resolved Hospital Problem list     Assessment & Plan:  Acute on chronic respiratory failure -Continue oxygen supplementation to maintain saturations greater than 89% -Bronchodilator treatment  Chronic obstructive pulmonary disease with exacerbation Obstructive lung disease is advanced with severely reduced FEV1 and diffusing capacity on last PFT from 2017, the expectation is progression of disease  Mild pulmonary hypertension on previous echocardiogram -Follow-up echocardiogram will be ordered to  assess pulmonary pressures -This may be contributing to worsening limitation and intolerance to activities  Concern for pulmonary embolism with elevated D-dimer -Advanced obstructive lung disease places her at increased risk of thromboembolic events -Agree with CT PE protocol  At present, continue bronchodilators, continue  steroids, continue short course of antibiotics  Agree with-palliative care see patient and may be most appropriate to have hospice care involved Patient was able to maintain on Trelegy prior to hospitalization and extubated goal at discharge Steroids should be weaned over 7 to 10 days  We will follow with you Thank you for the consultation I believe this is progressive obstructive lung disease approaching end-stage disease  Updated patient's son Astraea Gaughran  Labs   CBC: Recent Labs  Lab 05/22/22 1426 05/22/22 1851 05/25/22 0521  WBC 8.9 9.5 7.6  HGB 10.8* 11.4* 10.2*  HCT 35.7* 37.4 33.8*  MCV 94.4 93.3 94.7  PLT 316 298 644    Basic Metabolic Panel: Recent Labs  Lab 05/22/22 1426 05/22/22 1851 05/25/22 0521  NA 140  --  144  K 3.9  --  4.0  CL 98  --  98  CO2 37*  --  40*  GLUCOSE 109*  --  90  BUN 20  --  23  CREATININE 0.42* 0.45 0.46  CALCIUM 9.0  --  9.2   GFR: Estimated Creatinine Clearance: 37.9 mL/min (by C-G formula based on SCr of 0.46 mg/dL). Recent Labs  Lab 05/22/22 1426 05/22/22 1851 05/25/22 0521  WBC 8.9 9.5 7.6    Liver Function Tests: Recent Labs  Lab 05/22/22 1426  AST 16  ALT 29  ALKPHOS 58  BILITOT 0.3  PROT 6.4*  ALBUMIN 3.4*   No results for input(s): "LIPASE", "AMYLASE" in the last 168 hours. No results for input(s): "AMMONIA" in the last 168 hours.  ABG    Component Value Date/Time   PHART 7.344 (L) 12/22/2010 0427   PCO2ART 54.4 (H) 12/22/2010 0427   PO2ART 91.0 12/22/2010 0427   HCO3 29.7 (H) 12/22/2010 0427   TCO2 31 12/22/2010 0427   O2SAT 96.0 12/22/2010 0427     Coagulation Profile: No results for input(s): "INR", "PROTIME" in the last 168 hours.  Cardiac Enzymes: No results for input(s): "CKTOTAL", "CKMB", "CKMBINDEX", "TROPONINI" in the last 168 hours.  HbA1C: Hgb A1c MFr Bld  Date/Time Value Ref Range Status  03/08/2019 04:26 AM 5.1 4.8 - 5.6 % Final    Comment:    (NOTE) Pre diabetes:           5.7%-6.4% Diabetes:              >6.4% Glycemic control for   <7.0% adults with diabetes     CBG: No results for input(s): "GLUCAP" in the last 168 hours.  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[place]), Anxiety disorder, Aortic aneurysm ([place]), Brain metastasis, Cancer ([place]), Complication of anesthesia (2011), Compression fracture, COPD (chronic obstructive pulmonary disease) ([place]), Depression, Diastolic dysfunction, Diverticulosis, Dysphagia, Emphysema lung ([place]) (11/08/2014), Headache, Hiatal hernia, History of kidney stones, Hypothyroidism, IBS (irritable bowel syndrome), Iron deficiency anemia due to chronic blood loss (12/05/2016), On home O2 (12/15/2013), Pneumonia, PUD (peptic ulcer disease), Shortness of breath, SIADH (syndrome of inappropriate ADH production) ([place]), and Trigeminal neuralgia.   Surgical History:   Past Surgical History:  Procedure Laterality Date   ABDOMINAL  HYSTERECTOMY     APPLICATION OF CRANIAL NAVIGATION N/A 03/18/2019   Procedure: APPLICATION OF CRANIAL NAVIGATION;  Surgeon: Consuella Lose, MD;  Location: [facility];  Service: Neurosurgery;  Laterality: N/A;   BACK SURGERY  January 13, 2014   BRAIN SURGERY N/A    Phreesia 03/28/2020   CATARACT EXTRACTION     CATARACT EXTRACTION W/PHACO  09/02/2012   Procedure: CATARACT EXTRACTION PHACO AND INTRAOCULAR LENS PLACEMENT (IOC);  Surgeon: Elta Guadeloupe T. 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 [place] Alar: moderate diverticulosis, hemorrhoids, redundant colon. next TCS in 10-15 years.    CRANIOTOMY Left 03/18/2019   Procedure: Left stereotactic craniotomy for tumor resection;  Surgeon: Consuella Lose, MD;   Location: [facility];  Service: Neurosurgery;  Laterality: Left;  Left stereotactic craniotomy for tumor resection   ENDARTERECTOMY Right 08/21/2013   Procedure: RIGHT CAROTID ANEURYSM RESECTION;  Surgeon: Rosetta Posner, MD;  Location: [facility];  Service: Vascular;  Laterality: Right;   ESOPHAGEAL DILATION  02/24/2018   Procedure: ESOPHAGEAL DILATION;  Surgeon: Danie Binder, MD;  [facility];  Service: Endoscopy;;   ESOPHAGOGASTRODUODENOSCOPY  11/13/2009   w/dilation to 7mm, gastric ulceration (H.Pylori) s/p treatment   ESOPHAGOGASTRODUODENOSCOPY  11/21/2009   distal esophageal web, gastritis   ESOPHAGOGASTRODUODENOSCOPY  03/14/12   OHY:[id number] in the distal esophagus/Mild gastritis/small HH. + H.pylori, prescribed Pylera. Finished treatment.    ESOPHAGOGASTRODUODENOSCOPY N/A 12/13/2017   Dr. [place] Alar: esophageal web s/p dilation, gastritis, no h.pylori   ESOPHAGOGASTRODUODENOSCOPY N/A 02/24/2018   Dr. [place] Alar: web in prox esophagus and in distal esophagus s/p dilation, mild gastritis, mild pyloric stenosis, mild post ulcer duodenal deformity at D1/D2   fatty tumor removal from lt groin     FRACTURE SURGERY Left    hand and left ring finger   GIVENS CAPSULE STUDY N/A 12/26/2017   incomplete   GIVENS CAPSULE STUDY N/A 02/24/2018   normal small bowel   KNEE SURGERY     patella tendon repair june 2019 [facility]   LUNG CANCER SURGERY  12/2010   Left VATS, minithoracotomy, LLL superior segmentectomy   ORIF PATELLA Right 03/18/2018   Procedure: OPEN REDUCTION INTERNAL (ORIF) FIXATION RIGHT PATELLA;  Surgeon: Carole Civil, MD;  Location[facility];  Service: Orthopedics;  Laterality: Right;   ORIF WRIST FRACTURE Left 04/09/2014   Procedure: OPEN REDUCTION INTERNAL FIXATION (ORIF) WRIST FRACTURE;  Surgeon: Renette Butters, MD;  Location: [facility];  Service: Orthopedics;  Laterality: Left;   PERCUTANEOUS PINNING Left 04/09/2014   Procedure: PERCUTANEOUS PINNING EXTREMITY;  Surgeon: Renette Butters, MD;   Location: [facility];  Service: Orthopedics;  Laterality: Left;   SAVORY DILATION N/A 12/13/2017   Procedure: SAVORY DILATION;  Surgeon: Danie Binder, MD;  [facility];  Service: Endoscopy;  Laterality: N/A;   TUBAL LIGATION     vocal cord surgery  02/06/2011   laryngoscopy with bilateral vocal cord Radiesse injection for vocal cord paralysis   YAG LASER APPLICATION Left 94/85/4627   Procedure: YAG LASER APPLICATION;  Surgeon: Rutherford Guys, MD;  Location: [facility];  Service: Ophthalmology;  Laterality: Left;     Social History:   reports that she quit smoking about 11 years ago. Her smoking use included cigarettes. She has a 10.00 pack-year smoking history. She has never used smokeless  tobacco. She reports that she does not currently use alcohol. She reports that she does not use drugs.   Family History:  Her family history includes Breast cancer in her daughter; Cancer in her brother, daughter, and sister; Heart attack in her father; Hypertension in her father; Liver cancer in her sister; Pulmonary embolism in her mother. There is no history of Colon cancer.   Allergies Allergies  Allergen Reactions   Sulfonamide Derivatives Hives and Rash   Cipro [Ciprofloxacin Hcl] Hives   Iohexol Other (See Comments)    Per [facility], pt is allergic to IV contrast, no type of reaction was available. Pt cannot remember but first reacted around 15 yrs ago from an IVP. Notes were date from 2009 at urology center., [place] Date: [id number]    Iodinated Contrast Media Itching and Rash   Prozac [Fluoxetine Hcl] Rash     Home Medications  Prior to Admission medications   Medication Sig Start Date End Date Taking? Authorizing Provider  albuterol (VENTOLIN HFA) 108 (90 Base) MCG/ACT inhaler INHALE 2 PUFFS EVERY 6 HOURS AS NEEDED FOR WHEEZING OR SHORTNESS OF BREATH. Patient taking differently: Inhale 2 puffs into the lungs every 6 (six) hours as needed for wheezing or shortness of breath. 03/13/22   Yes Orson Slick, MD  ALPRAZolam Duanne Moron) 0.5 MG tablet Take 1 tablet (0.5 mg total) by mouth 2 (two) times daily as needed for anxiety. TAKE (1) TABLET BY MOUTH TWICE A DAY AS NEEDED. Patient taking differently: Take 0.5 mg by mouth 2 (two) times daily as needed for anxiety. 05/17/22  Yes [name], MD  citalopram (CELEXA) 40 MG tablet TAKE (1/2) TABLET BY MOUTH AT BEDTIME. Patient taking differently: Take 20 mg by mouth at bedtime. 05/17/22  Yes [name], MD  [employer] ACETAMINOPHEN 325 MG tablet Take 650 mg by mouth every 6 (six) hours as needed for mild pain or moderate pain. 01/17/22  Yes [provider]  denosumab (PROLIA) 60 MG/ML SOSY injection INJECT INTO UPPER ARM, THIGH, OR ABDOMEN ONCE. Patient taking differently: Inject 60 mg into the skin every 6 (six) months. INJECT INTO UPPER ARM, THIGH, OR ABDOMEN ONCE. 06/07/21  Yes [name], MD  dexamethasone (DECADRON) 4 MG tablet Take 1 tablet by mouth every day Patient taking differently: Take 4 mg by mouth daily with breakfast. 04/13/22  Yes Pickard, Cammie Mcgee, MD  Dextromethorphan-guaiFENesin (MUCINEX DM MAXIMUM STRENGTH) 60-1200 MG TB12 Take 1 tablet by mouth every 12 (twelve) hours.   Yes [provider]  docusate sodium (COLACE) 250 MG capsule Take 250 mg by mouth daily.   Yes [provider]  feeding supplement ([place]) LIQD Take 237 mLs by mouth daily.   Yes [provider]  Fluticasone-Umeclidin-Vilant (TRELEGY ELLIPTA) 100-62.5-25 MCG/ACT AEPB Inhale 1 puff into the lungs daily. Patient taking differently: Inhale 1 puff into the lungs daily before breakfast. 10/24/21  Yes Pickard, Cammie Mcgee, MD  ipratropium-albuterol (DUONEB) 0.5-2.5 (3) MG/3ML SOLN Take 3 mLs by nebulization every 6 (six) hours as needed. Patient taking differently: Take 3 mLs by nebulization See admin instructions. Nebulize 3 ml's and inhale into the lungs one to three times a day 05/08/22  Yes Pickard,  Cammie Mcgee, MD  levocetirizine (XYZAL) 5 MG tablet Take 1 tablet by mouth every day Patient taking differently: Take 5 mg by mouth at bedtime. 03/18/22  Yes [name], MD  levothyroxine (SYNTHROID) 50 MCG tablet Take 1 tablet by mouth before breakfast  Patient taking differently: Take 50 mcg by mouth daily before breakfast. 03/18/22  Yes Pickard, Cammie Mcgee, MD  loperamide (IMODIUM A-D) 2 MG tablet Take 2 mg by mouth 4 (four) times daily as needed for diarrhea or loose stools.   Yes [provider]  ondansetron (ZOFRAN) 4 MG tablet TAKE 1 TABLET BY MOUTH 3 TIMES DAILY AS NEEDED FOR NAUSEA AND VOMITING. Patient taking differently: Take 4 mg by mouth 3 (three) times daily as needed for nausea or vomiting. 05/05/21  Yes [name], MD  osimertinib mesylate (TAGRISSO) 80 MG tablet TAKE 1 TABLET (80 MG TOTAL) BY MOUTH DAILY. Patient taking differently: Take 80 mg by mouth in the morning. 03/19/22 03/19/23 Yes Orson Slick, MD  OXcarbazepine (TRILEPTAL) 150 MG tablet Take 1 tablet by mouth twice daily Patient taking differently: Take 150 mg by mouth 2 (two) times daily. 03/18/22  Yes [name], MD  OXYGEN Inhale 3 L/min into the lungs continuous.   Yes [provider]  PROTEIN PO Take 1 Container by mouth See admin instructions. Pure Protein- Drink 1 carton by mouth once a day   Yes [provider]  Multiple Vitamin (MULTIVITAMIN WITH MINERALS) TABS tablet Take 1 tablet by mouth daily.    [provider]    Sherrilyn Rist, MD [facility] Pager: See Shea Evans

## 2022-05-25 NOTE — Progress Notes (Signed)
OT Cancellation Note  Patient Details Name: LACRECIA DELVAL MRN: [id number] DOB: [birth year]   Cancelled Treatment:    Reason Eval/Treat Not Completed: Patient declined, no reason specified  Lenward Chancellor 05/25/2022, 12:35 PM

## 2022-05-25 NOTE — Progress Notes (Signed)
Chaplain met with [name] and provided emotional and spiritual support.  She stated that she is "[name]" and that she is "Ready to go home to heaven" but she was worried about whether her heart was ready for that.  Chaplain provided spiritual support around forgiveness and compassion.  Her daughter died from breast cancer and her daughter-in-law has some health issues as well.  Chaplain provided prayer and grief support around the loss of her daughter.  [address], [place] Pager, [phone]

## 2022-05-26 DIAGNOSIS — C3490 Malignant neoplasm of unspecified part of unspecified bronchus or lung: Secondary | ICD-10-CM

## 2022-05-26 DIAGNOSIS — J9621 Acute and chronic respiratory failure with hypoxia: Secondary | ICD-10-CM | POA: Diagnosis not present

## 2022-05-26 MED ORDER — ALPRAZOLAM 0.5 MG PO TABS
1.0000 mg | ORAL_TABLET | ORAL | Status: DC | PRN
  Administered 2022-05-26: 1 mg via ORAL
  Filled 2022-05-26 (×2): qty 2

## 2022-05-26 MED ORDER — HYDROCODONE-ACETAMINOPHEN 5-325 MG PO TABS
1.0000 | ORAL_TABLET | Freq: Three times a day (TID) | ORAL | Status: DC
  Administered 2022-05-26: 1 via ORAL
  Filled 2022-05-26: qty 1

## 2022-05-26 MED ORDER — MORPHINE SULFATE (CONCENTRATE) 10 MG/0.5ML PO SOLN: 10.0000 mg | ORAL | Status: DC | PRN

## 2022-05-26 NOTE — Progress Notes (Deleted)
PROGRESS NOTE  [name] VFI:[id number] DOB: [birth year] DOA: 05/22/2022 PCP: [name], MD   LOS: 4 days   Brief Narrative / Interim history: 81 year old female with history of lung cancer status postresection, with brain mets, chronic hypoxia with 2 L at home, COPD comes into the hospital for worsening shortness of breath.  She was recently seen at the beginning of August and completed prednisone and antibiotics for COPD exacerbation, but did not improve and eventually came to the hospital.  She was found to be more hypoxic than baseline requiring 6 L on admission  Subjective / 24h Interval events: Breathing overall stabilized.  She feels better while on antianxiety medications.  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  Wheezing has improved.  Had a set of old PFTs back in 2017, FEV1 over FVC was 40%.  This is likely has progressed and possibly that this is now end-stage.  Pulmonology consulted, evaluated patient while hospitalized.  Given lack of improvement she underwent a CT scan of the chest 8/18 which showed severe emphysema, but no PE.  Palliative care also consulted  Active problems Metastatic lung cancer-continue home medications, follows with Dr. [name] as an outpatient.  Weakness-likely in the setting of #1.  PT commencing home health  Elevated D-dimer-minimal, age-adjusted makes VTE unlikely.  CT angiogram without PE  Hypothyroidism-continue Synthroid  Mood disorder-continue home medications   Scheduled Meds:  arformoterol  15 mcg  Nebulization BID   budesonide (PULMICORT) nebulizer solution  0.25 mg Nebulization BID   citalopram  20 mg Oral QHS   feeding supplement  237 mL Oral BID BM   HYDROcodone-acetaminophen  1 tablet Oral TID   levothyroxine  50 mcg Oral Q0600   loratadine  10 mg Oral Daily   OXcarbazepine  150 mg Oral BID   sodium chloride flush  3 mL Intravenous Q12H   Continuous Infusions:  sodium chloride     PRN Meds:.sodium chloride, acetaminophen **OR** acetaminophen, ALPRAZolam, ipratropium-albuterol, morphine CONCENTRATE, ondansetron **OR** ondansetron (ZOFRAN) IV, polyvinyl alcohol, sodium chloride flush  Diet Orders (From admission, onward)     Start     Ordered   05/23/22 1516  Diet regular Room service appropriate? Yes; Fluid consistency: Thin  Diet effective now       Question Answer Comment  Room service appropriate? Yes   Fluid consistency: Thin      05/23/22 1515            DVT prophylaxis:    Lab Results  Component Value Date   PLT 285 05/25/2022      Code Status: DNR  Family Communication: No family at bedside  Status is: Inpatient Remains inpatient appropriate because: Severity of illness   Level of care: Med-Surg   Objective: Vitals:   05/25/22 2049 05/25/22 2050 05/26/22 0418 05/26/22 0827  BP:   114/69   Pulse:   71   Resp:   14   Temp:   (!) 97.4 F (36.3 C)   TempSrc:   Oral   SpO2: 94% 94% 97% 95%  Weight:  Height:       No intake or output data in the 24 hours ending 05/26/22 1124  Wt Readings from Last 3 Encounters:  05/22/22 42.8 kg  05/11/22 42.8 kg  05/10/22 42.2 kg    Examination:  Constitutional: NAD, cachectic appearing Eyes: lids and conjunctivae normal, no scleral icterus ENMT: mmm Neck: normal, supple Respiratory: clear to auscultation bilaterally, no wheezing, no crackles, tachypneic, pursed lip breathing  Cardiovascular: Regular rate and rhythm, no murmurs / rubs / gallops. No LE edema. Abdomen: soft, no distention,  no tenderness. Bowel sounds positive.  Skin: no rashes Neurologic: no focal deficits, equal strength   Data Reviewed: I have independently reviewed following labs and imaging studies   CBC Recent Labs  Lab 05/22/22 1426 05/22/22 1851 05/25/22 0521  WBC 8.9 9.5 7.6  HGB 10.8* 11.4* 10.2*  HCT 35.7* 37.4 33.8*  PLT 316 298 285  MCV 94.4 93.3 94.7  MCH 28.6 28.4 28.6  MCHC 30.3 30.5 30.2  RDW 14.5 14.3 14.1     Recent Labs  Lab 05/22/22 1426 05/22/22 1500 05/22/22 1851 05/25/22 0521  NA 140  --   --  144  K 3.9  --   --  4.0  CL 98  --   --  98  CO2 37*  --   --  40*  GLUCOSE 109*  --   --  90  BUN 20  --   --  23  CREATININE 0.42*  --  0.45 0.46  CALCIUM 9.0  --   --  9.2  AST 16  --   --   --   ALT 29  --   --   --   ALKPHOS 58  --   --   --   BILITOT 0.3  --   --   --   ALBUMIN 3.4*  --   --   --   DDIMER 0.70*  --   --   --   BNP  --  62.2  --   --      ------------------------------------------------------------------------------------------------------------------ No results for input(s): "CHOL", "HDL", "LDLCALC", "TRIG", "CHOLHDL", "LDLDIRECT" in the last 72 hours.  Lab Results  Component Value Date   HGBA1C 5.1 03/08/2019   ------------------------------------------------------------------------------------------------------------------ No results for input(s): "TSH", "T4TOTAL", "T3FREE", "THYROIDAB" in the last 72 hours.  Invalid input(s): "FREET3"  Cardiac Enzymes No results for input(s): "CKMB", "TROPONINI", "MYOGLOBIN" in the last 168 hours.  Invalid input(s): "CK" ------------------------------------------------------------------------------------------------------------------    Component Value Date/Time   BNP 62.2 05/22/2022 1500    CBG: No results for input(s): "GLUCAP" in the last 168 hours.  Recent Results (from the past 240 hour(s))  Resp Panel by RT-PCR (Flu A&B, Covid) Anterior Nasal Swab     Status: None   Collection Time:  05/22/22  2:26 PM   Specimen: Anterior Nasal Swab  Result Value Ref Range Status   SARS Coronavirus 2 by RT PCR NEGATIVE NEGATIVE Final    Comment: (NOTE) SARS-CoV-2 target nucleic acids are NOT DETECTED.  The SARS-CoV-2 RNA is generally detectable in upper respiratory specimens during the acute phase of infection. The lowest concentration of SARS-CoV-2 viral copies this assay can detect is 138 copies/mL. A negative result does not preclude SARS-Cov-2 infection and should not be used as the sole basis for treatment or other patient management decisions. A negative result may occur with  improper specimen collection/handling, submission of specimen other than nasopharyngeal swab, presence of viral mutation(s) within the areas targeted by  this assay, and inadequate number of viral copies(<138 copies/mL). A negative result must be combined with clinical observations, patient history, and epidemiological information. The expected result is Negative.  Fact Sheet for Patients:  EntrepreneurPulse.com.au  Fact Sheet for Healthcare Providers:  IncredibleEmployment.be  This test is no t yet approved or cleared by the [place] FDA and  has been authorized for detection and/or diagnosis of SARS-CoV-2 by FDA under an Emergency Use Authorization (EUA). This EUA will remain  in effect (meaning this test can be used) for the duration of the COVID-19 declaration under Section 564(b)(1) of the Act, 21 U.S.C.section 360bbb-3(b)(1), unless the authorization is terminated  or revoked sooner.       Influenza A by PCR NEGATIVE NEGATIVE Final   Influenza B by PCR NEGATIVE NEGATIVE Final    Comment: (NOTE) The Xpert Xpress SARS-CoV-2/FLU/RSV plus assay is intended as an aid in the diagnosis of influenza from Nasopharyngeal swab specimens and should not be used as a sole basis for treatment. Nasal washings and aspirates are unacceptable for Xpert Xpress  SARS-CoV-2/FLU/RSV testing.  Fact Sheet for Patients: EntrepreneurPulse.com.au  Fact Sheet for Healthcare Providers: IncredibleEmployment.be  This test is not yet approved or cleared by the [place] FDA and has been authorized for detection and/or diagnosis of SARS-CoV-2 by FDA under an Emergency Use Authorization (EUA). This EUA will remain in effect (meaning this test can be used) for the duration of the COVID-19 declaration under Section 564(b)(1) of the Act, 21 U.S.C. section 360bbb-3(b)(1), unless the authorization is terminated or revoked.  Performed at [facility], [place] [address] [place], [place] [address]      Radiology Studies: CT Angio Chest Pulmonary Embolism (PE) W or WO Contrast  Result Date: 05/25/2022 CLINICAL DATA:  Pulmonary embolism (PE) suspected, positive D-dimer EXAM: CT ANGIOGRAPHY CHEST WITH CONTRAST TECHNIQUE: Multidetector CT imaging of the chest was performed using the standard protocol during bolus administration of intravenous contrast. Multiplanar CT image reconstructions and MIPs were obtained to evaluate the vascular anatomy. RADIATION DOSE REDUCTION: This exam was performed according to the departmental dose-optimization program which includes automated exposure control, adjustment of the mA and/or kV according to patient size and/or use of iterative reconstruction technique. CONTRAST:  68mL OMNIPAQUE IOHEXOL 350 MG/ML SOLN COMPARISON:  None Available. FINDINGS: Cardiovascular: Satisfactory opacification of the pulmonary arteries to the segmental level. No evidence of pulmonary embolism. Normal heart size. No significant pericardial effusion. The thoracic aorta is normal in caliber. At least mild atherosclerotic plaque of the thoracic aorta. Mediastinum/Nodes: No enlarged mediastinal, hilar, or axillary lymph nodes. Thyroid gland, trachea, and esophagus demonstrate no significant findings.  Lungs/Pleura: Limited evaluation due to respiratory motion artifact. Moderate to severe Paraseptal and centrilobular emphysematous changes. No focal consolidation. Interval development of a 7 x 7 mm left upper lobe pulmonary nodule (5:178). Interval development of a left lower lobe 9 x 6 mm pulmonary nodule. Bibasilar atelectasis versus scarring. Bilateral lower lobe passive atelectasis. No pulmonary mass. Bilateral trace pleural effusions. No pneumothorax. Upper Abdomen: No acute abnormality. Musculoskeletal: No chest wall abnormality. No suspicious lytic or blastic osseous lesions. No acute displaced fracture. Similar-appearing severe multilevel degenerative change of the spine with similar-appearing severe multilevel thoracolumbar compression fractures in a patient status post thoracolumbar surgical hardware placement. Review of the MIP images confirms the above findings. IMPRESSION: 1. No pulmonary embolus. 2. Interval development of a 7 x 7 mm left upper lobe pulmonary nodule. Interval development of a left lower lobe 9 x 6  mm pulmonary nodule. Non-contrast chest CT at 3-6 months is recommended. If the nodules are stable at time of repeat CT, then future CT at 18-24 months (from today's scan) is considered optional for low-risk patients, but is recommended for high-risk patients. This recommendation follows the consensus statement: Guidelines for Management of Incidental Pulmonary Nodules Detected on CT Images: From the Fleischner Society 2017; Radiology 2017; 284:228-243. 3. Aortic Atherosclerosis (ICD10-I70.0) and Emphysema (ICD10-J43.9). Electronically Signed   By: Iven Finn M.D.   On: 05/25/2022 22:04     Marzetta Board, MD, PhD [facility]  Between 7 am - 7 pm I am available, please contact me via Amion (for emergencies) or Securechat (non urgent messages)  Between 7 pm - 7 am I am not available, please contact night coverage MD/APP via Amion

## 2022-05-26 NOTE — Consult Note (Signed)
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  CT imaging of the chest as advanced disease as well as tumor progression despite targeted treatment.  Last seen by oncology on 8/4.  For the past several months she has been on both antibiotic therapy and steroid therapy for persistent COPD exacerbation.   She recently moved back to [place] from [facility] where she was living with her daughter. She lives in a mobile home behind her son's house and her son is supportive and accessible.She expresses concerns about being alone and is worried about being a burden on her family.   She has had worsening functional status and dyspnea over the past few months. Ongoing weight loss and activity intolerance. Denies any recent falls. Limits her activity due to severe air hunger.  I assessed [name] understanding of her illness and she recognizes that she is approaching the end of life.  Tells me that she is "tired" and that she is "ready to go home", then clarifies that she means her "heavenly" home.  She is very clear that she does not want her life prolonged in her current state because she knows that her condition will only continue to get worse and there is little chance of recovery or improvement.  She is having a difficult time handling her disease progression which has left her at to a point where she cannot care for herself-she is afraid of experiencing distress at the end of life and afraid of not being able to breathe, not being able to get the help that she needs.  I was able to get her son on the phone at bedside and we had a conversation to discuss [name] wishes.  They are both in agreement and accepting that she is approaching end-of-life and they want comfort to be the main goal.   Recommendations: DNR, allow  for natural death Focus entirely on comfort measures. Discontinue Tagrisso due to disease progression, patient has been on steroids for at least 3 months while taking the immunotherapy which may be impacting the intent.  Maximize her COPD treatment for comfort Add Roxanol for dyspnea prn Alprazolam for air hunger anxiety Supplemental O2 as tolerated. Give her extremely frail state and shift to complete comfort I anticipate she will decline quickly within the next few days and will start having increased symptom management needs. She is appropriate for referral to [place], family request [employer].  Ruth Hacker, DO Palliative Medicine  Time:70 min

## 2022-05-26 NOTE — Progress Notes (Signed)
[facility] called to give report spoke with Helene Kelp, Therapist, sports.

## 2022-05-26 NOTE — TOC Progression Note (Signed)
Transition of Care [facility]) - Progression Note    Patient Details  Name: [name] MRN: [id number] Date of Birth: [birth year]  Transition of Care [facility]) CM/SW [place], LCSW Phone Number: 05/26/2022, 12:49 PM  Clinical Narrative:    Pt has been evaluated by palliative care and is being recommended for residential hospice at [facility]. CSW referred pt to [facility] who will evaluate pt for placement at their residential facility.   CSW informed [facility] agency of change in discharge plans as pt will no longer need home health services.    Expected Discharge Plan: [place] Barriers to Discharge: No Barriers Identified  Expected Discharge Plan and Services Expected Discharge Plan: [place] In-house Referral: Clinical Social Work Discharge Planning Services: CM Consult Post Acute Care Choice: [place] arrangements for the past 2 months: Mobile Home                 DME Arranged: N/A DME Agency: NA       HH Arranged: OT, PT [place] Agency: [place] Date [place]: 05/24/22 Time [place]: [id number] Representative spoke with at [place]: Amy   Social Determinants of Health ([place]) Interventions    Readmission Risk Interventions    05/24/2022   10:56 AM  Readmission Risk Prevention Plan  Transportation Screening Complete  PCP or Specialist Appt within 5-7 Days Complete  Home Care Screening Complete  Medication Review (RN CM) Complete

## 2022-05-26 NOTE — Progress Notes (Signed)
Manufacturing engineer [facility]) Hospital Liaison Note  Referral received for patient/family interest in [facility]. Chart under review by [facility] physician.   Hospice eligibility confirmed.   Unit RN please call report to [phone] prior to patient leaving the unit. Please send signed DNR and paperwork with patient.   Please leave all IV access and ports in place.   Please call with any questions or concerns. Thank you  Roselee Nova, [place] Hospital Liaison [phone]

## 2022-05-26 NOTE — Progress Notes (Signed)
PT Cancellation Note  Patient Details Name: [name] MRN: [id number] DOB: September 09, 1941   Cancelled Treatment:    Reason Eval/Treat Not Completed: Other (comment) Cancelled PT orders due to pt is now comfort care and going inpatient hospice at d/c.  Please reconsult if there is a change or therapy needed further.  Abran Richard, PT Acute Rehab [facility] [phone]   Karlton Lemon 05/26/2022, 12:09 PM

## 2022-05-26 NOTE — Progress Notes (Signed)
Patient left with [facility] to go to [facility].

## 2022-05-26 NOTE — Discharge Summary (Signed)
Physician Discharge Summary  [name] [id number]:[id number] DOB: October 17, 1940 DOA: 05/22/2022  PCP: [name], MD  Admit date: 05/22/2022 Discharge date: 05/26/2022  Admitted From: home Disposition:  residential hospice  Discharge Condition: stable CODE STATUS: DNR Diet Orders (From admission, onward)     Start     Ordered   05/23/22 1516  Diet regular Room service appropriate? Yes; Fluid consistency: Thin  Diet effective now       Question Answer Comment  Room service appropriate? Yes   Fluid consistency: Thin      05/23/22 1515            HPI: Per admitting MD, [name] is a 81 y.o. female with medical history significant of chronic respiratory failure on 2L O2 at home, anxiety, COPD, hypothyroidism, history of lung cancer s/p resection who presents with SOB. She states that she has not been feeling well for "quiet a while" sounds like months. She was recently seen by primary care physician for pneumonia and COPD exacerbation 8/1 and completed prednisone and Augmentin/azithromycin. She states her breathing did improve with treatments, but her energy level has still remained low.  She came into the emergency department because she "could not deal with this alone at home anymore."  She did denies any fevers or chills, chest pains.  She admits to having hard time eating food.  No nausea or vomiting reported.  No abdominal pain reported.  Hospital Course / Discharge diagnoses: Principal Problem:   Acute on chronic respiratory failure with hypoxia (HCC) Active Problems:   Adenocarcinoma of lung (HCC)   Malignant neoplasm metastatic to brain (HCC)   COPD exacerbation (HCC)   Hypothyroidism   Pressure injury of skin   Principal problem Acute on chronic hypoxic respiratory failure in the setting of COPD exacerbation-she was initially placed on IV steroids, scheduled nebulizers with improvement in her symptoms.  Wheezing has resolved.  She had a set of old PFTs back in  2017, FEV1 over FVC was 40%.  This is likely has progressed and it is very likely that her COPD is now end-stage.  Pulmonology consulted, evaluated patient while hospitalized, feeling like she has progressed significantly.  Given lack of improvement she underwent a CT scan of the chest 8/18 which showed severe emphysema, but no PE.  Palliative care consulted, after discussing goals of care, it is felt like she is approaching end-stage COPD and there is not much recovery possible at this point.  She will be transition to residential hospice for comfort approach   Active problems Metastatic lung cancer-now transition to residential hospice  [facility] in the setting of #1.  Elevated D-dimer-minimal, age-adjusted makes VTE unlikely.  CT angiogram without PE Hypothyroidism-continue Synthroid   Sepsis ruled out  Discharge Instructions   Allergies as of 05/26/2022       Reactions   Sulfonamide Derivatives Hives, Rash   Cipro [ciprofloxacin Hcl] Hives   Iohexol Other (See Comments)   Per [facility], pt is allergic to IV contrast, no type of reaction was available. Pt cannot remember but first reacted around 15 yrs ago from an IVP. Notes were date from 2009 at urology center., [place] Date: [id number]   Iodinated Contrast Media Itching, Rash   Prozac [fluoxetine Hcl] Rash        Medication List     STOP taking these medications    citalopram 40 MG tablet Commonly known as: CELEXA   dexamethasone 4 MG tablet Commonly known as: DECADRON  docusate sodium 250 MG capsule Commonly known as: COLACE   feeding supplement Liqd   levocetirizine 5 MG tablet Commonly known as: XYZAL   loperamide 2 MG tablet Commonly known as: IMODIUM A-D   multivitamin with minerals Tabs tablet   ondansetron 4 MG tablet Commonly known as: ZOFRAN   Prolia 60 MG/ML Sosy injection Generic drug: denosumab   PROTEIN PO   Tagrisso 80 MG tablet Generic drug: osimertinib mesylate       TAKE  these medications    albuterol 108 (90 Base) MCG/ACT inhaler Commonly known as: VENTOLIN HFA INHALE 2 PUFFS EVERY 6 HOURS AS NEEDED FOR WHEEZING OR SHORTNESS OF BREATH. What changed:  how much to take how to take this when to take this reasons to take this additional instructions   ALPRAZolam 0.5 MG tablet Commonly known as: XANAX Take 1 tablet (0.5 mg total) by mouth 2 (two) times daily as needed for anxiety. TAKE (1) TABLET BY MOUTH TWICE A DAY AS NEEDED. What changed: additional instructions   [employer] Acetaminophen 325 MG tablet Generic drug: acetaminophen Take 650 mg by mouth every 6 (six) hours as needed for mild pain or moderate pain.   ipratropium-albuterol 0.5-2.5 (3) MG/3ML Soln Commonly known as: DUONEB Take 3 mLs by nebulization every 6 (six) hours as needed. What changed:  when to take this additional instructions   levothyroxine 50 MCG tablet Commonly known as: SYNTHROID Take 1 tablet by mouth before breakfast What changed:  how much to take how to take this when to take this additional instructions   Mucinex DM Maximum Strength 60-1200 MG Tb12 Take 1 tablet by mouth every 12 (twelve) hours.   OXcarbazepine 150 MG tablet Commonly known as: TRILEPTAL Take 1 tablet by mouth twice daily What changed:  how much to take how to take this when to take this additional instructions   OXYGEN Inhale 3 L/min into the lungs continuous.   Trelegy Ellipta 100-62.5-25 MCG/ACT Aepb Generic drug: Fluticasone-Umeclidin-Vilant Inhale 1 puff into the lungs daily. What changed: when to take this        [place]. Follow up.   Why: Latricia Heft is to provide home health PT and OT Contact information: [place] [id number] [phone]                 Consultations: Pulmonology Palliative care  Procedures/Studies:  CT Angio Chest Pulmonary Embolism (PE) W or WO Contrast  Result Date:  05/25/2022 CLINICAL DATA:  Pulmonary embolism (PE) suspected, positive D-dimer EXAM: CT ANGIOGRAPHY CHEST WITH CONTRAST TECHNIQUE: Multidetector CT imaging of the chest was performed using the standard protocol during bolus administration of intravenous contrast. Multiplanar CT image reconstructions and MIPs were obtained to evaluate the vascular anatomy. RADIATION DOSE REDUCTION: This exam was performed according to the departmental dose-optimization program which includes automated exposure control, adjustment of the mA and/or kV according to patient size and/or use of iterative reconstruction technique. CONTRAST:  41mL OMNIPAQUE IOHEXOL 350 MG/ML SOLN COMPARISON:  None Available. FINDINGS: Cardiovascular: Satisfactory opacification of the pulmonary arteries to the segmental level. No evidence of pulmonary embolism. Normal heart size. No significant pericardial effusion. The thoracic aorta is normal in caliber. At least mild atherosclerotic plaque of the thoracic aorta. Mediastinum/Nodes: No enlarged mediastinal, hilar, or axillary lymph nodes. Thyroid gland, trachea, and esophagus demonstrate no significant findings. Lungs/Pleura: Limited evaluation due to respiratory motion artifact. Moderate to severe Paraseptal and centrilobular emphysematous  changes. No focal consolidation. Interval development of a 7 x 7 mm left upper lobe pulmonary nodule (5:178). Interval development of a left lower lobe 9 x 6 mm pulmonary nodule. Bibasilar atelectasis versus scarring. Bilateral lower lobe passive atelectasis. No pulmonary mass. Bilateral trace pleural effusions. No pneumothorax. Upper Abdomen: No acute abnormality. Musculoskeletal: No chest wall abnormality. No suspicious lytic or blastic osseous lesions. No acute displaced fracture. Similar-appearing severe multilevel degenerative change of the spine with similar-appearing severe multilevel thoracolumbar compression fractures in a patient status post thoracolumbar  surgical hardware placement. Review of the MIP images confirms the above findings. IMPRESSION: 1. No pulmonary embolus. 2. Interval development of a 7 x 7 mm left upper lobe pulmonary nodule. Interval development of a left lower lobe 9 x 6 mm pulmonary nodule. Non-contrast chest CT at 3-6 months is recommended. If the nodules are stable at time of repeat CT, then future CT at 18-24 months (from today's scan) is considered optional for low-risk patients, but is recommended for high-risk patients. This recommendation follows the consensus statement: Guidelines for Management of Incidental Pulmonary Nodules Detected on CT Images: From the Fleischner Society 2017; Radiology 2017; 284:228-243. 3. Aortic Atherosclerosis (ICD10-I70.0) and Emphysema (ICD10-J43.9). Electronically Signed   By: Iven Finn M.D.   On: 05/25/2022 22:04   DG Chest Port 1 View  Result Date: 05/22/2022 CLINICAL DATA:  Dyspnea EXAM: PORTABLE CHEST 1 VIEW COMPARISON:  Chest a 01/2022 FINDINGS: Improved lung volumes. Improvement in bibasilar atelectasis/infiltrate. Negative for heart failure. Small pleural effusions bilaterally. Chronic right rib fracture IMPRESSION: Improved lung volumes. Improvement in bibasilar atelectasis/infiltrate. Electronically Signed   By: Franchot Gallo M.D.   On: 05/22/2022 14:53   DG Chest 2 View  Result Date: 05/11/2022 CLINICAL DATA:  Follow-up pneumonia EXAM: CHEST - 2 VIEW COMPARISON:  Radiographs 04/04/2022 FINDINGS: Unchanged cardiomediastinal silhouette. Aortic calcification. Similar small bilateral pleural effusions. Increased patchy airspace opacities in the left mid and lower lung. No definite pneumothorax. Thoracolumbar fusion hardware. Numerous thoracic vertebral body compression fractures, unchanged. Remote right rib fracture. IMPRESSION: Increased patchy opacities in the left mid and lower lung could be due to pneumonia in the appropriate clinical setting. Small bilateral pleural effusions.  Electronically Signed   By: Placido Sou M.D.   On: 05/11/2022 17:24     Subjective: - no chest pain, shortness of breath, no abdominal pain, nausea or vomiting.   Discharge Exam: BP (!) 115/59 (BP Location: Left Arm)   Pulse 86   Temp (!) 97.4 F (36.3 C) (Oral)   Resp 20   Ht 5\' 1"  (1.549 m)   Wt 42.8 kg   SpO2 97%   BMI 17.82 kg/m   General: Pt is alert, awake, not in acute distress Cardiovascular: RRR, S1/S2 +, no rubs, no gallops Respiratory: CTA bilaterally, no wheezing, no rhonchi Abdominal: Soft, NT, ND, bowel sounds + Extremities: no edema, no cyanosis   The results of significant diagnostics from this hospitalization (including imaging, microbiology, ancillary and laboratory) are listed below for reference.     Microbiology: Recent Results (from the past 240 hour(s))  Resp Panel by RT-PCR (Flu A&B, Covid) Anterior Nasal Swab     Status: None   Collection Time: 05/22/22  2:26 PM   Specimen: Anterior Nasal Swab  Result Value Ref Range Status   SARS Coronavirus 2 by RT PCR NEGATIVE NEGATIVE Final    Comment: (NOTE) SARS-CoV-2 target nucleic acids are NOT DETECTED.  The SARS-CoV-2 RNA is generally detectable in upper respiratory specimens  during the acute phase of infection. The lowest concentration of SARS-CoV-2 viral copies this assay can detect is 138 copies/mL. A negative result does not preclude SARS-Cov-2 infection and should not be used as the sole basis for treatment or other patient management decisions. A negative result may occur with  improper specimen collection/handling, submission of specimen other than nasopharyngeal swab, presence of viral mutation(s) within the areas targeted by this assay, and inadequate number of viral copies(<138 copies/mL). A negative result must be combined with clinical observations, patient history, and epidemiological information. The expected result is Negative.  Fact Sheet for Patients:   EntrepreneurPulse.com.au  Fact Sheet for Healthcare Providers:  IncredibleEmployment.be  This test is no t yet approved or cleared by the [place] FDA and  has been authorized for detection and/or diagnosis of SARS-CoV-2 by FDA under an Emergency Use Authorization (EUA). This EUA will remain  in effect (meaning this test can be used) for the duration of the COVID-19 declaration under Section 564(b)(1) of the Act, 21 U.S.C.section 360bbb-3(b)(1), unless the authorization is terminated  or revoked sooner.       Influenza A by PCR NEGATIVE NEGATIVE Final   Influenza B by PCR NEGATIVE NEGATIVE Final    Comment: (NOTE) The Xpert Xpress SARS-CoV-2/FLU/RSV plus assay is intended as an aid in the diagnosis of influenza from Nasopharyngeal swab specimens and should not be used as a sole basis for treatment. Nasal washings and aspirates are unacceptable for Xpert Xpress SARS-CoV-2/FLU/RSV testing.  Fact Sheet for Patients: EntrepreneurPulse.com.au  Fact Sheet for Healthcare Providers: IncredibleEmployment.be  This test is not yet approved or cleared by the [place] FDA and has been authorized for detection and/or diagnosis of SARS-CoV-2 by FDA under an Emergency Use Authorization (EUA). This EUA will remain in effect (meaning this test can be used) for the duration of the COVID-19 declaration under Section 564(b)(1) of the Act, 21 U.S.C. section 360bbb-3(b)(1), unless the authorization is terminated or revoked.  Performed at [facility], [place] [address] [place], [place] [address]      Labs: Basic Metabolic Panel: Recent Labs  Lab 05/22/22 1426 05/22/22 1851 05/25/22 0521  NA 140  --  144  K 3.9  --  4.0  CL 98  --  98  CO2 37*  --  40*  GLUCOSE 109*  --  90  BUN 20  --  23  CREATININE 0.42* 0.45 0.46  CALCIUM 9.0  --  9.2   Liver Function Tests: Recent Labs  Lab  05/22/22 1426  AST 16  ALT 29  ALKPHOS 58  BILITOT 0.3  PROT 6.4*  ALBUMIN 3.4*   CBC: Recent Labs  Lab 05/22/22 1426 05/22/22 1851 05/25/22 0521  WBC 8.9 9.5 7.6  HGB 10.8* 11.4* 10.2*  HCT 35.7* 37.4 33.8*  MCV 94.4 93.3 94.7  PLT 316 298 285   CBG: No results for input(s): "GLUCAP" in the last 168 hours. Hgb A1c No results for input(s): "HGBA1C" in the last 72 hours. Lipid Profile No results for input(s): "CHOL", "HDL", "LDLCALC", "TRIG", "CHOLHDL", "LDLDIRECT" in the last 72 hours. Thyroid function studies No results for input(s): "TSH", "T4TOTAL", "T3FREE", "THYROIDAB" in the last 72 hours.  Invalid input(s): "FREET3" Urinalysis    Component Value Date/Time   COLORURINE YELLOW 11/17/2019 [place] 11/17/2019 1451   LABSPEC 1.027 11/17/2019 1451   PHURINE 6.0 11/17/2019 1451   GLUCOSEU NEGATIVE 11/17/2019 1451   HGBUR NEGATIVE 11/17/2019 1451   [place]  06/04/2019 1441   [place] 11/17/2019 1451   PROTEINUR NEGATIVE 11/17/2019 1451   UROBILINOGEN 0.2 06/01/2015 1145   NITRITE NEGATIVE 11/17/2019 1451   LEUKOCYTESUR NEGATIVE 11/17/2019 1451    FURTHER DISCHARGE INSTRUCTIONS:   Get Medicines reviewed and adjusted: Please take all your medications with you for your next visit with your Primary MD   Laboratory/radiological data: Please request your Primary MD to go over all hospital tests and procedure/radiological results at the follow up, please ask your Primary MD to get all Hospital records sent to his/her office.   In some cases, they will be blood work, cultures and biopsy results pending at the time of your discharge. Please request that your primary care M.D. goes through all the records of your hospital data and follows up on these results.   Also Note the following: If you experience worsening of your admission symptoms, develop shortness of breath, life threatening emergency, suicidal or homicidal thoughts you  must seek medical attention immediately by calling 911 or calling your MD immediately  if symptoms less severe.   You must read complete instructions/literature along with all the possible adverse reactions/side effects for all the Medicines you take and that have been prescribed to you. Take any new Medicines after you have completely understood and accpet all the possible adverse reactions/side effects.    Do not drive when taking Pain medications or sleeping medications (Benzodaizepines)   Do not take more than prescribed Pain, Sleep and Anxiety Medications. It is not advisable to combine anxiety,sleep and pain medications without talking with your primary care practitioner   Special Instructions: If you have smoked or chewed Tobacco  in the last 2 yrs please stop smoking, stop any regular Alcohol  and or any Recreational drug use.   Wear Seat belts while driving.   Please note: You were cared for by a hospitalist during your hospital stay. Once you are discharged, your primary care physician will handle any further medical issues. Please note that NO REFILLS for any discharge medications will be authorized once you are discharged, as it is imperative that you return to your primary care physician (or establish a relationship with a primary care physician if you do not have one) for your post hospital discharge needs so that they can reassess your need for medications and monitor your lab values.  Time coordinating discharge: 35 minutes  SIGNED:  Marzetta Board, MD, PhD 05/26/2022, 2:23 PM

## 2022-05-26 NOTE — Progress Notes (Signed)
OT Cancellation Note  Patient Details Name: [name] MRN: [id number] DOB: [birth year]   Cancelled Treatment:    Reason Eval/Treat Not Completed: Other (comment)  Patient transitioning to full comfort care at this time.  Please see latest therapy progress note for current level of functioning and progress toward goals.     Jackelyn Poling OTR/L, [facility] Office# [phone] Pager# [phone]   05/26/2022, 12:32 PM

## 2022-05-26 NOTE — TOC Transition Note (Addendum)
Transition of Care [facility]) - CM/SW Discharge Note   Patient Details  Name: [name] MRN: [id number] Date of Birth: 03/03/1941  [facility] CM/SW Contact:  Vassie Moselle, [place] Phone Number: 05/26/2022, 3:06 PM   Clinical Narrative:    Pt is to discharge to [facility] for residential hospice care. Pt and son agreeable to this plan. RN to call report to [phone]. [facility] to be called for transportation.    Final next level of care: [place] Barriers to Discharge: No Barriers Identified   Patient Goals and CMS Choice Patient states their goals for this hospitalization and ongoing recovery are:: To return home   Choice offered to / list presented to : Patient  Discharge Placement              Patient chooses bed at: Other - please specify in the comment section below: ([facility]) Patient to be transferred to facility by: [place] Name of family member notified: Son, Nealy Hickmon Patient and family notified of of transfer: 05/26/22  Discharge Plan and Services In-house Referral: Clinical Social Work Discharge Planning Services: CM Consult Post Acute Care Choice: Home Health          DME Arranged: N/A DME Agency: NA       HH Arranged: NA [place] Agency: NA Date HH Agency Contacted: 05/24/22 Time [place]: [phone] Representative spoke with at [place]: [place] ([place]) Interventions     Readmission Risk Interventions    05/26/2022    3:02 PM 05/24/2022   10:56 AM  Readmission Risk Prevention Plan  Transportation Screening Complete Complete  PCP or Specialist Appt within 5-7 Days Complete Complete  Home Care Screening Complete Complete  Medication Review (RN CM) Complete Complete

## 2022-06-01 ENCOUNTER — Telehealth: Payer: Self-pay

## 2022-06-01 NOTE — Telephone Encounter (Signed)
Pt's husband called to say that patient is now at [facility]. He requested that patient's appointment be cancelled.

## 2022-06-04 ENCOUNTER — Ambulatory Visit: Payer: PPO | Admitting: Hematology and Oncology

## 2022-06-04 ENCOUNTER — Other Ambulatory Visit: Payer: PPO

## 2022-06-05 ENCOUNTER — Other Ambulatory Visit (HOSPITAL_COMMUNITY): Payer: Self-pay

## 2022-06-07 ENCOUNTER — Other Ambulatory Visit (HOSPITAL_COMMUNITY): Payer: Self-pay

## 2022-06-08 ENCOUNTER — Inpatient Hospital Stay: Payer: PPO | Admitting: Hematology and Oncology

## 2022-06-08 ENCOUNTER — Inpatient Hospital Stay: Payer: PPO

## 2022-06-08 IMAGING — MR MR MRA HEAD W/O CM
1 series · 9 of 48 positions shown · IV contrast (multihance)
Comparison: Prior brain MRI examinations 04/30/2020 and earlier.
MRA head 03/04/2019.

CLINICAL DATA: Trigeminal neuralgia. Headache, cluster/trigeminal.
Brain/CNS neoplasm, assess treatment response. Metastatic lung
cancer. Additional history provided: SRS to 5 brain metastases
03/17/2019, left parietal craniotomy for tumor resection 03/18/2019,
SRS to 2 additional metastases 01/01/2020.

EXAM:
MRI HEAD WITHOUT AND WITH CONTRAST
MRA HEAD WITHOUT CONTRAST
TECHNIQUE: Multiplanar, multiecho pulse sequences of the brain and surrounding
structures were obtained without and with intravenous contrast.
Angiographic images of the head were obtained using MRA technique
without contrast.
CONTRAST:  10mL MULTIHANCE GADOBENATE DIMEGLUMINE 529 MG/ML IV SOLN

[Series 4: [id number]-[id number] · axial · 0.6mm · 0.26mm/px · z∈[-84,+10]mm · 9 of 204 slices shown]
[im 13/204]
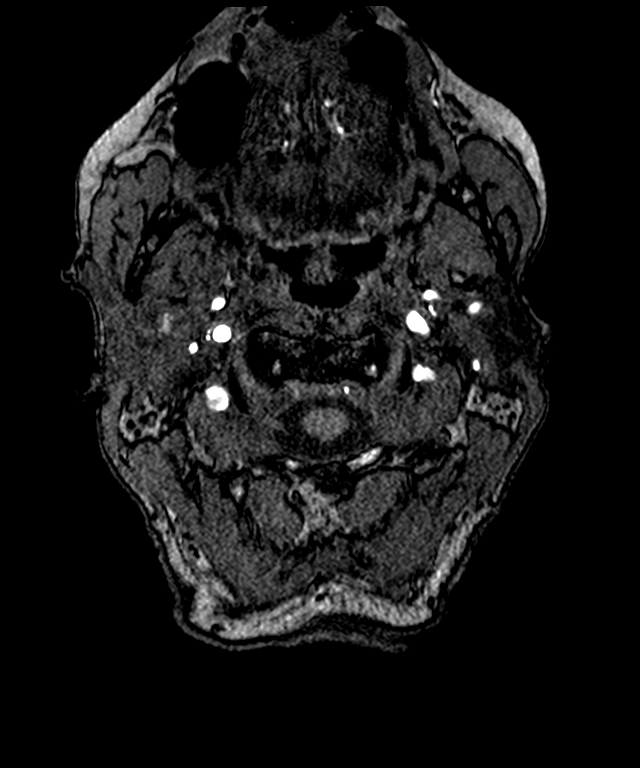
[im 35/204]
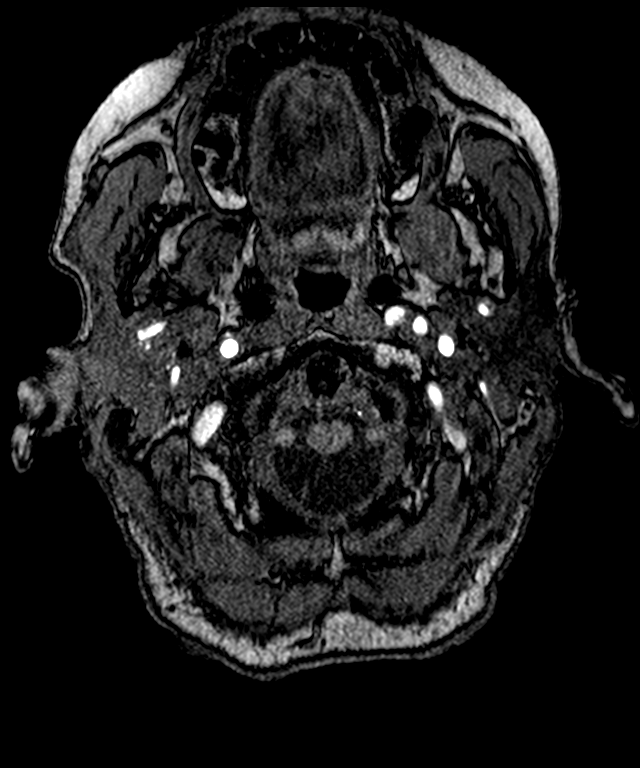
[im 39/204]
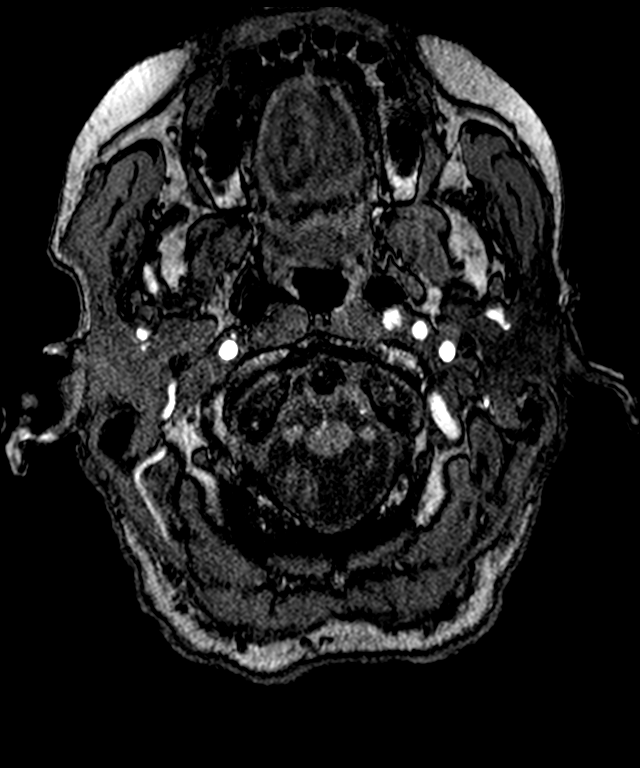
[im 65/204]
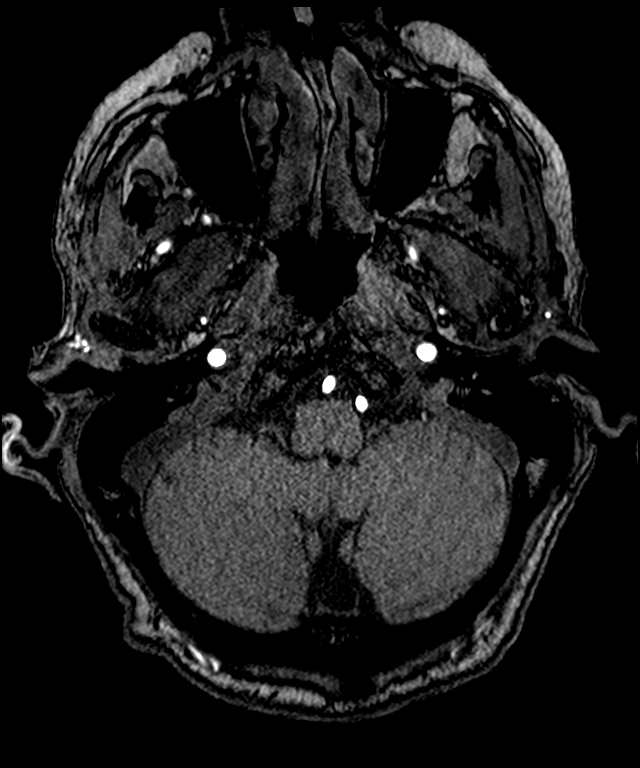
[im 91/204]
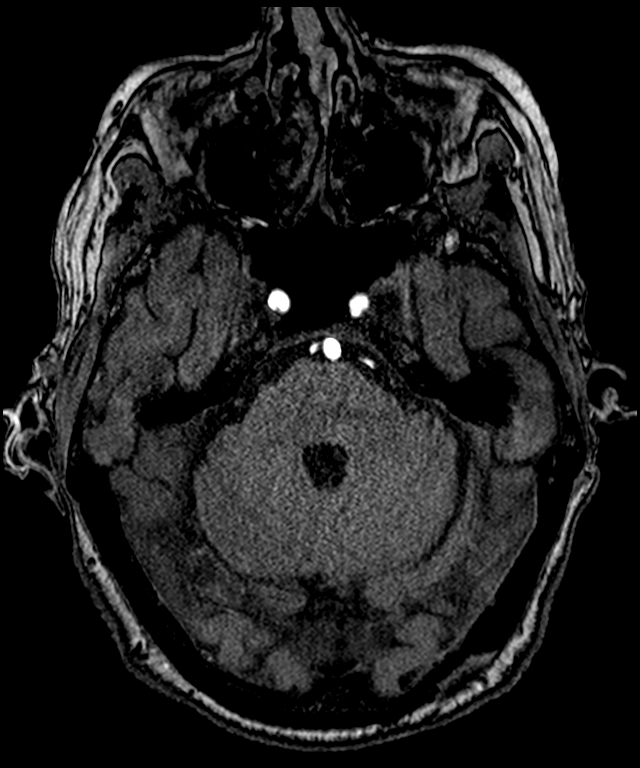
[im 104/204]
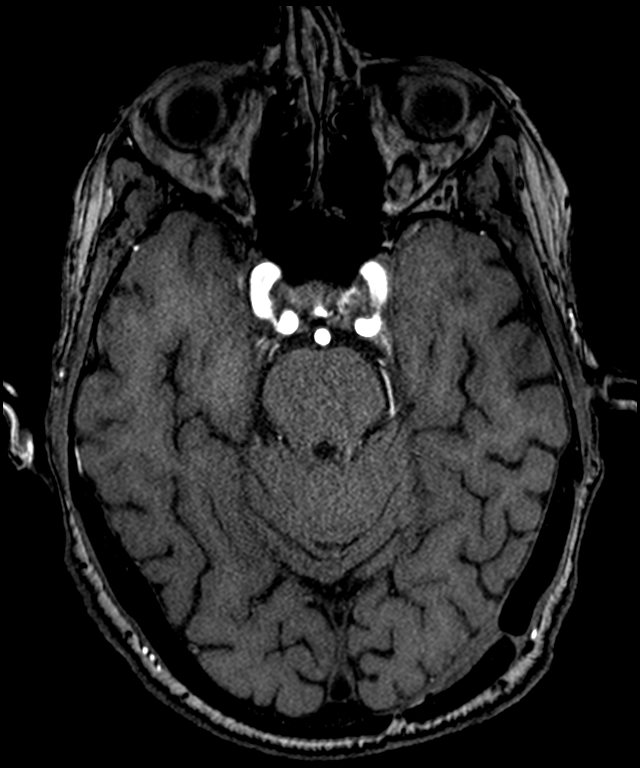
[im 117/204]
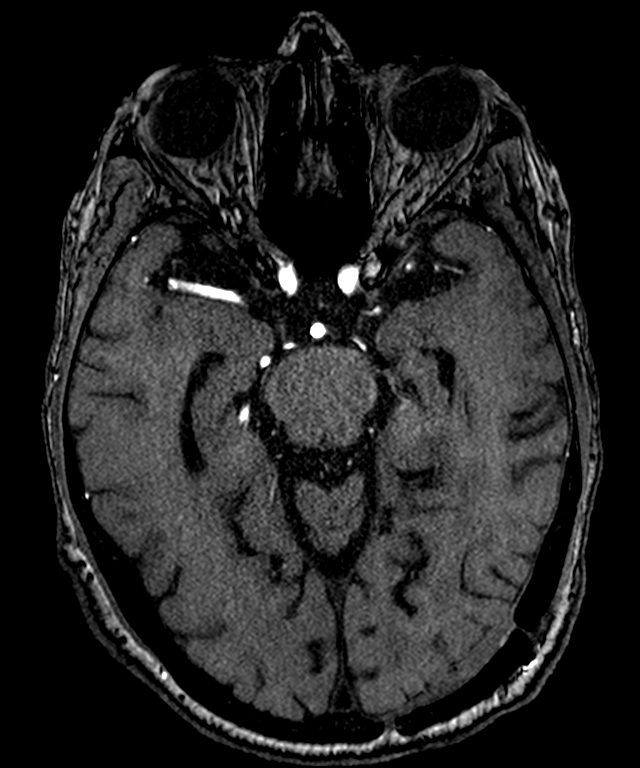
[im 143/204]
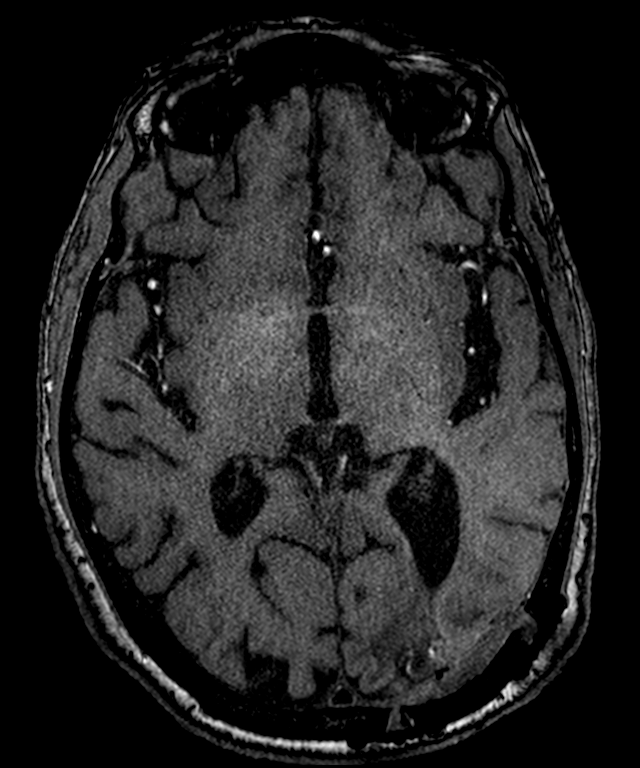
[im 173/204]
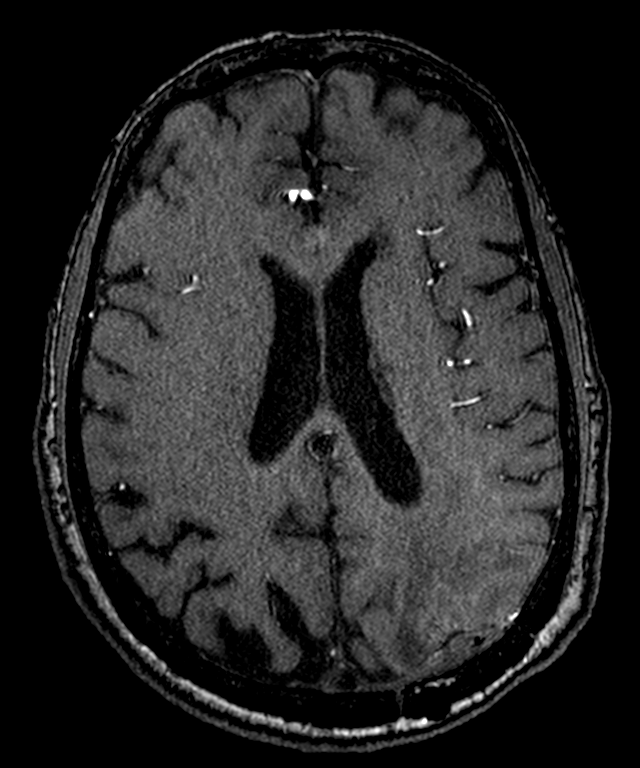

[9 of 48 positions shown; findings below may reference images not displayed]

FINDINGS: MRI HEAD FINDINGS

Brain:

Stable generalized cerebral atrophy.

Again demonstrated is a hemosiderin stained left parietooccipital
resection cavity. Irregular and gyriform enhancement has increased
from the prior examination, now measuring up to 12 mm in thickness
(for instance as seen on series 14, image 90) (series 15, image 9).
This irregular enhancement extends to the site of a previously
demonstrated 3 mm nodular focus of enhancement within the
superolateral aspect of the cavity. Surrounding T2/FLAIR
hyperintense signal abnormality has not significantly changed.

A previously demonstrated 4 mm lesion within the medial right
cerebellum is no longer appreciated.

A 4 mm enhancing lesion within the left cerebellar hemisphere was
present on the prior examination but seen to better advantage on
today's study (series 14, image 48).

New 2 mm enhancing lesion more posteriorly within the medial left
cerebellar hemisphere (series 14, image 50).

New 2 mm enhancing lesion within the lateral left cerebellum (series
14, image 42).

A 3 mm lesion within the high posterior right frontal lobe has
decreased in size (series 14, image 145) (previously 5 mm).

A previously demonstrated 2 mm enhancing lesion within the left
frontal lobe has increased in size, now measuring 8 mm (series 14,
image 125) (series 11, image 39) . The lesion now demonstrates
central T2/FLAIR hyperintensity and T1 hyperintensity with a
peripheral low signal rim. Precontrast T1 hyperintensity limits
evaluation for enhancement at this site.

Multifocal T2/FLAIR hyperintensity elsewhere within the cerebral
white matter and within the pons is nonspecific, but compatible
chronic small vessel ischemic disease.

No evidence of acute infarction.

No extra-axial fluid collection.

No midline shift.

Vascular: Expected proximal arterial flow voids.

Skull and upper cervical spine: No focal suspicious marrow lesion.

Sinuses/Orbits: Visualized orbits show no acute finding. Trace
ethmoid sinus mucosal thickening. No significant mastoid effusion.

MRA HEAD FINDINGS

The intracranial internal carotid arteries are patent. The M1 middle
cerebral arteries are patent without significant stenosis. No M2
proximal branch occlusion or high-grade proximal stenosis is
identified. The anterior cerebral arteries are patent. 1-2 mm
inferiorly projecting vascular protrusion arising from the
supraclinoid right ICA which may reflect an infundibulum or small
aneurysm (series 103, image 12).

The intracranial vertebral arteries are patent. The basilar artery
is patent. The posterior cerebral arteries are patent.
IMPRESSION: MRI brain:

1. Irregular and gyriform enhancement at site of the left
parietooccipital resection cavity has increased, now measuring up to
12 mm in thickness. This enhancement extends to the site of a
previously demonstrated 3 mm nodular enhancing focus within the
superolateral aspect of the cavity. Findings may reflect
post-treatment inflammation or recurrent tumor. Short interval MRI
follow-up is recommended. Surrounding T2 hyperintense signal
abnormality has not appreciably changed.
2. New 2 mm metastasis within the posteromedial left cerebellar
hemisphere.
3. New 2 mm metastasis within the lateral left cerebellum.
4. An additional 4 mm metastasis within the left cerebellum was
present on the prior MRI of 04/30/2020, but is seen to better
advantage on today's study.
5. A previously demonstrated 4 mm metastasis within the medial right
cerebellum is no longer appreciated
6. 8 mm metastasis within the paramedian left frontal lobe,
increased in size and now demonstrating precontrast T1
hyperintensity which limits evaluation for enhancement.
7. A 3 mm lesion within the high posterior right frontal lobe has
decreased in size.

MRA head:

1. No intracranial large vessel occlusion or proximal high-grade
arterial stenosis.
2. 1-2 mm infundibulum versus small aneurysm arising from the
supraclinoid right ICA.

## 2022-06-08 IMAGING — MR MR HEAD WO/W CM
11 series · 48 of 48 positions shown · IV contrast (10 ml multihance)
Comparison: Prior brain MRI examinations 04/30/2020 and earlier.
MRA head 03/04/2019.

CLINICAL DATA: Trigeminal neuralgia. Headache, cluster/trigeminal.
Brain/CNS neoplasm, assess treatment response. Metastatic lung
cancer. Additional history provided: SRS to 5 brain metastases
03/17/2019, left parietal craniotomy for tumor resection 03/18/2019,
SRS to 2 additional metastases 01/01/2020.

EXAM:
MRI HEAD WITHOUT AND WITH CONTRAST
MRA HEAD WITHOUT CONTRAST
TECHNIQUE: Multiplanar, multiecho pulse sequences of the brain and surrounding
structures were obtained without and with intravenous contrast.
Angiographic images of the head were obtained using MRA technique
without contrast.
CONTRAST:  10mL MULTIHANCE GADOBENATE DIMEGLUMINE 529 MG/ML IV SOLN

[Series 5: FLAIR · sagittal · 3.0mm · 0.75mm/px · 2 of 40 slices shown (1 of 2)]
[im 1/40]
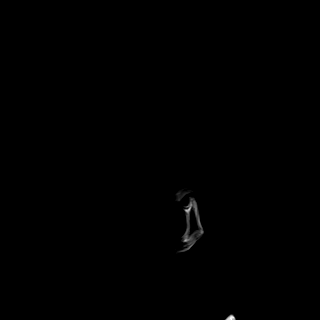
[im 40/40]
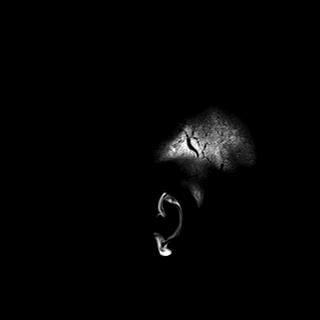

[Series 6: DWI · axial · 3.0mm · 1.50mm/px · z∈[-90,+64]mm · 5 of 82 slices shown (1 of 2)]
[im 1/82]
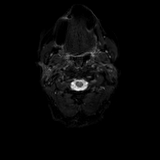
[im 21/82]
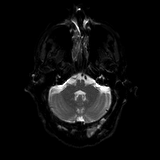
[im 41/82]
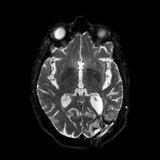
[im 61/82]
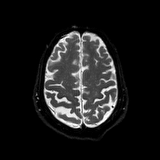
[im 82/82]
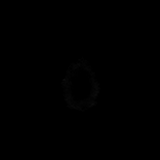

[Series 7: DWI · axial · 3.0mm · 1.50mm/px · z∈[-90,+64]mm · 2 of 40 slices shown (2 of 2)]
[im 1/40]
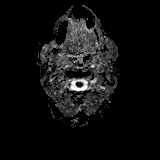
[im 40/40]
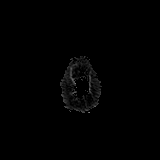

[Series 8: T2 · axial · 5.0mm · 0.57mm/px · z∈[-101,+65]mm · 2 of 29 slices shown (1 of 2)]
[im 1/29]
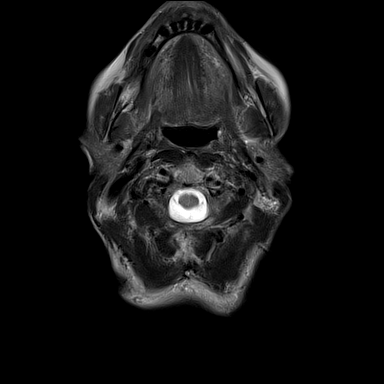
[im 29/29]
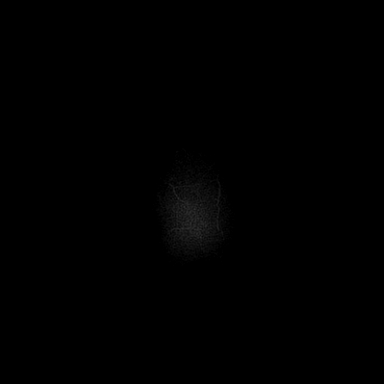

[Series 10: swi_images · axial · 1.5mm · 0.90mm/px · z∈[-98,+66]mm · 6 of 112 slices shown]
[im 1/112]
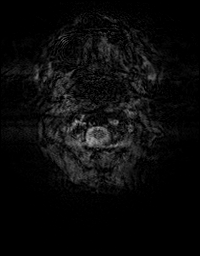
[im 23/112]
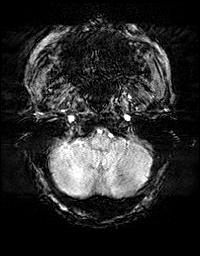
[im 45/112]
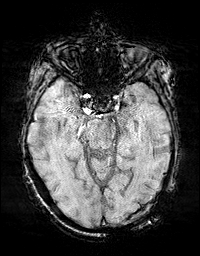
[im 67/112]
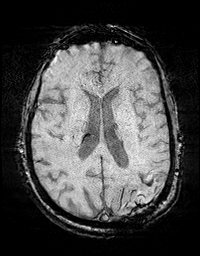
[im 89/112]
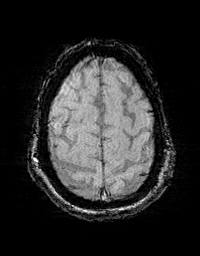
[im 112/112]
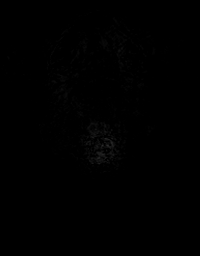

[Series 11: FLAIR · axial · 3.0mm · 0.86mm/px · z∈[-107,+60]mm · 3 of 57 slices shown (2 of 2)]
[im 1/57]
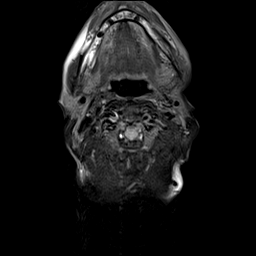
[im 29/57]
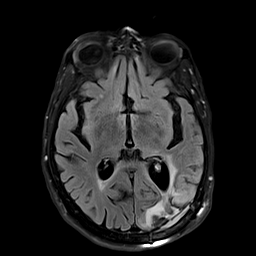
[im 57/57]
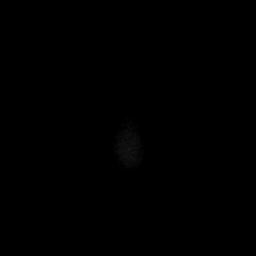

[Series 12: T2 · axial · non-contrast · 1.0mm · 0.86mm/px · z∈[-109,+62]mm · 10 of 176 slices shown (2 of 2)]
[im 1/176]
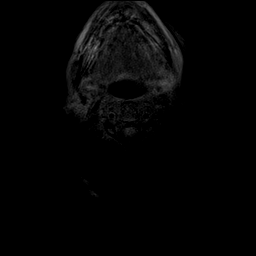
[im 20/176]
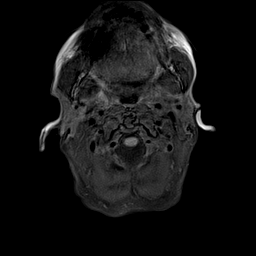
[im 39/176]
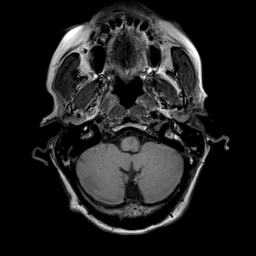
[im 59/176]
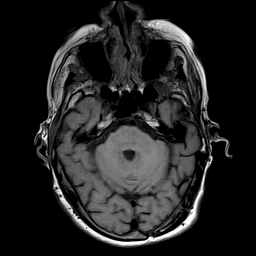
[im 78/176]
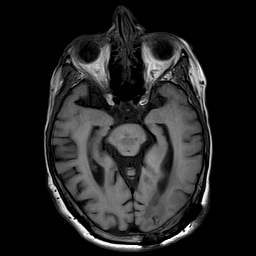
[im 98/176]
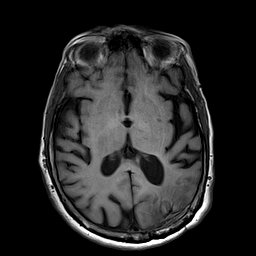
[im 117/176]
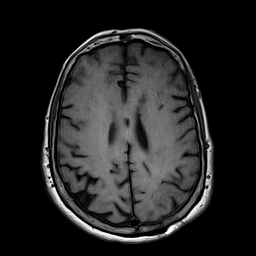
[im 137/176]
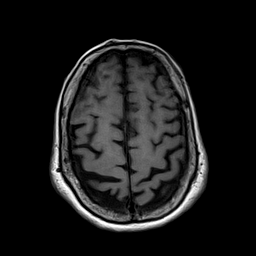
[im 156/176]
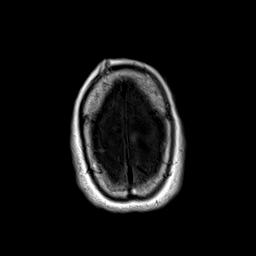
[im 176/176]
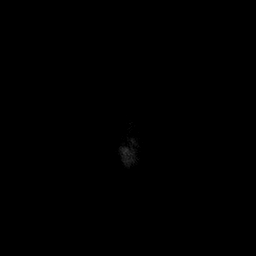

[Series 13: T2 post-contrast · coronal · 3.0mm · 0.57mm/px · 3 of 47 slices shown (1 of 2)]
[im 1/47]
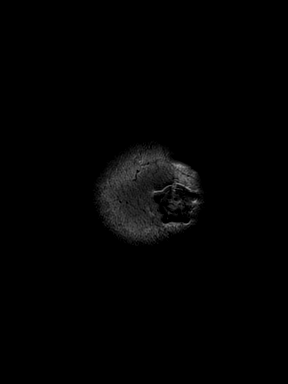
[im 24/47]
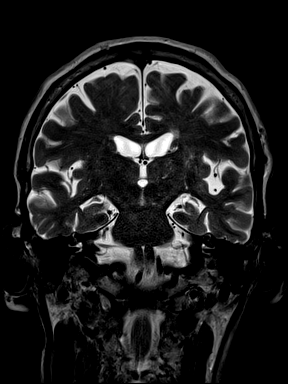
[im 47/47]
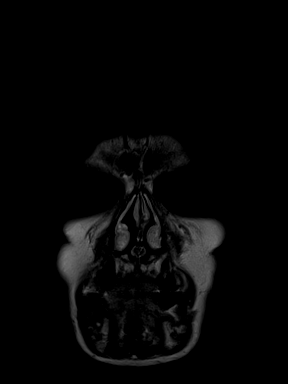

[Series 14: T2 post-contrast · axial · 1.0mm · 0.86mm/px · z∈[-109,+62]mm · 10 of 176 slices shown (2 of 2)]
[im 1/176]
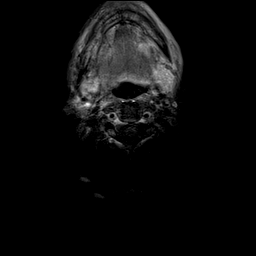
[im 20/176]
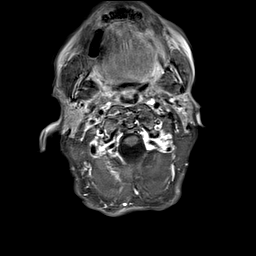
[im 39/176]
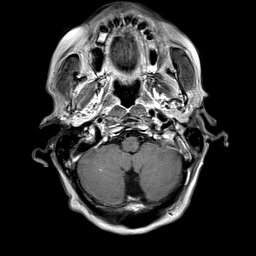
[im 59/176]
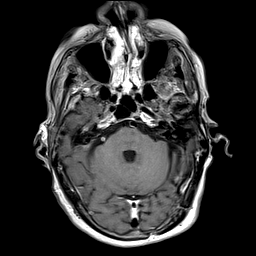
[im 78/176]
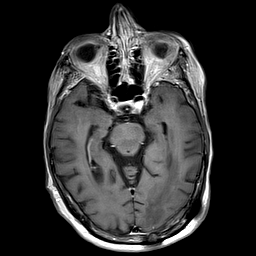
[im 98/176]
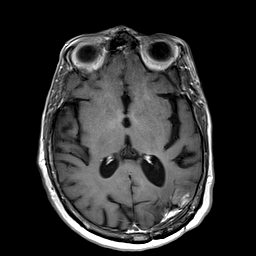
[im 117/176]
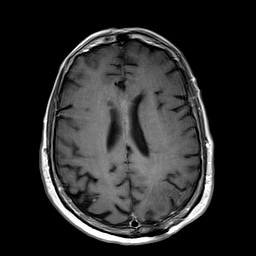
[im 137/176]
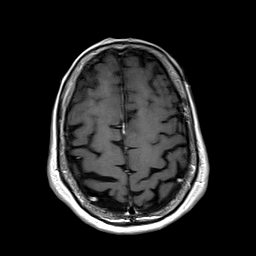
[im 156/176]
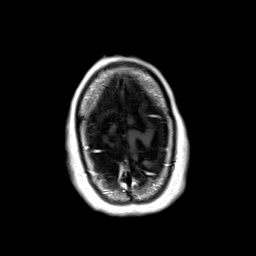
[im 176/176]
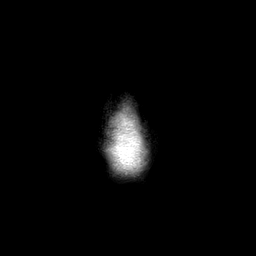

[Series 15: T1 post-contrast · coronal · 3.0mm · 0.57mm/px · 3 of 47 slices shown]
[im 1/47]
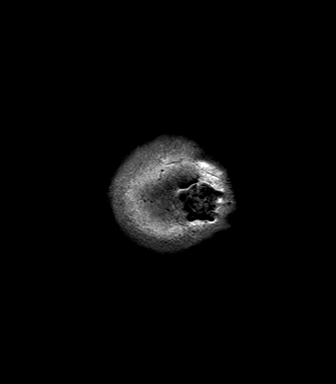
[im 24/47]
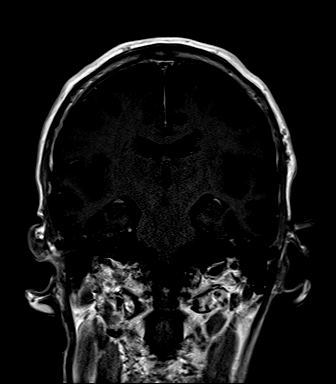
[im 47/47]
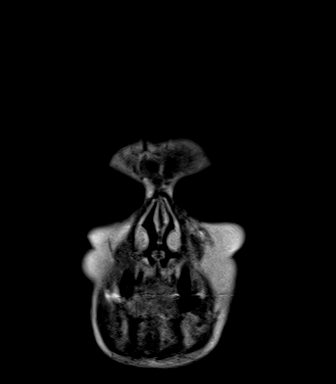

[Series 16: FLAIR post-contrast · sagittal · 3.0mm · 0.75mm/px · 2 of 39 slices shown]
[im 1/39]
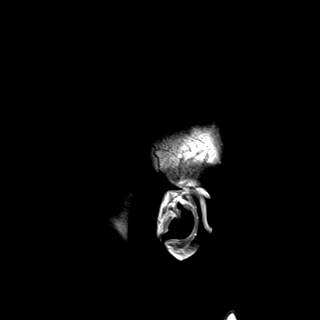
[im 39/39]
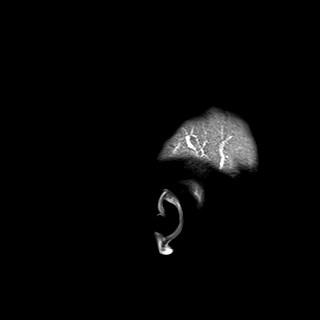

[48 of 48 positions shown; findings below may reference images not displayed]

FINDINGS: MRI HEAD FINDINGS

Brain:

Stable generalized cerebral atrophy.

Again demonstrated is a hemosiderin stained left parietooccipital
resection cavity. Irregular and gyriform enhancement has increased
from the prior examination, now measuring up to 12 mm in thickness
(for instance as seen on series 14, image 90) (series 15, image 9).
This irregular enhancement extends to the site of a previously
demonstrated 3 mm nodular focus of enhancement within the
superolateral aspect of the cavity. Surrounding T2/FLAIR
hyperintense signal abnormality has not significantly changed.

A previously demonstrated 4 mm lesion within the medial right
cerebellum is no longer appreciated.

A 4 mm enhancing lesion within the left cerebellar hemisphere was
present on the prior examination but seen to better advantage on
today's study (series 14, image 48).

New 2 mm enhancing lesion more posteriorly within the medial left
cerebellar hemisphere (series 14, image 50).

New 2 mm enhancing lesion within the lateral left cerebellum (series
14, image 42).

A 3 mm lesion within the high posterior right frontal lobe has
decreased in size (series 14, image 145) (previously 5 mm).

A previously demonstrated 2 mm enhancing lesion within the left
frontal lobe has increased in size, now measuring 8 mm (series 14,
image 125) (series 11, image 39) . The lesion now demonstrates
central T2/FLAIR hyperintensity and T1 hyperintensity with a
peripheral low signal rim. Precontrast T1 hyperintensity limits
evaluation for enhancement at this site.

Multifocal T2/FLAIR hyperintensity elsewhere within the cerebral
white matter and within the pons is nonspecific, but compatible
chronic small vessel ischemic disease.

No evidence of acute infarction.

No extra-axial fluid collection.

No midline shift.

Vascular: Expected proximal arterial flow voids.

Skull and upper cervical spine: No focal suspicious marrow lesion.

Sinuses/Orbits: Visualized orbits show no acute finding. Trace
ethmoid sinus mucosal thickening. No significant mastoid effusion.

MRA HEAD FINDINGS

The intracranial internal carotid arteries are patent. The M1 middle
cerebral arteries are patent without significant stenosis. No M2
proximal branch occlusion or high-grade proximal stenosis is
identified. The anterior cerebral arteries are patent. 1-2 mm
inferiorly projecting vascular protrusion arising from the
supraclinoid right ICA which may reflect an infundibulum or small
aneurysm (series 103, image 12).

The intracranial vertebral arteries are patent. The basilar artery
is patent. The posterior cerebral arteries are patent.
IMPRESSION: MRI brain:

1. Irregular and gyriform enhancement at site of the left
parietooccipital resection cavity has increased, now measuring up to
12 mm in thickness. This enhancement extends to the site of a
previously demonstrated 3 mm nodular enhancing focus within the
superolateral aspect of the cavity. Findings may reflect
post-treatment inflammation or recurrent tumor. Short interval MRI
follow-up is recommended. Surrounding T2 hyperintense signal
abnormality has not appreciably changed.
2. New 2 mm metastasis within the posteromedial left cerebellar
hemisphere.
3. New 2 mm metastasis within the lateral left cerebellum.
4. An additional 4 mm metastasis within the left cerebellum was
present on the prior MRI of 04/30/2020, but is seen to better
advantage on today's study.
5. A previously demonstrated 4 mm metastasis within the medial right
cerebellum is no longer appreciated
6. 8 mm metastasis within the paramedian left frontal lobe,
increased in size and now demonstrating precontrast T1
hyperintensity which limits evaluation for enhancement.
7. A 3 mm lesion within the high posterior right frontal lobe has
decreased in size.

MRA head:

1. No intracranial large vessel occlusion or proximal high-grade
arterial stenosis.
2. 1-2 mm infundibulum versus small aneurysm arising from the
supraclinoid right ICA.

## 2022-06-08 DEATH — deceased

## 2022-06-12 ENCOUNTER — Other Ambulatory Visit (HOSPITAL_COMMUNITY): Payer: Self-pay

## 2022-06-14 ENCOUNTER — Other Ambulatory Visit (HOSPITAL_COMMUNITY): Payer: Self-pay

## 2022-06-27 ENCOUNTER — Other Ambulatory Visit: Payer: PPO

## 2022-06-27 ENCOUNTER — Ambulatory Visit: Payer: PPO | Admitting: Physician Assistant

## 2022-06-28 ENCOUNTER — Other Ambulatory Visit: Payer: PPO

## 2022-07-02 ENCOUNTER — Ambulatory Visit: Payer: PPO | Admitting: Internal Medicine

## 2022-07-10 ENCOUNTER — Ambulatory Visit: Payer: PPO | Admitting: Physician Assistant

## 2022-07-10 ENCOUNTER — Other Ambulatory Visit: Payer: PPO

## 2022-08-22 IMAGING — CT CT CHEST W/ CM
2 of 6 series · 15 of 36 positions shown, 19 images · IV contrast (Omnipaque or Isovue)
Comparison: 06/22/2020.

CLINICAL DATA: Lung cancer.

EXAM:
CT CHEST WITH CONTRAST
TECHNIQUE: Multidetector CT imaging of the chest was performed during
intravenous contrast administration.
CONTRAST:  75mL OMNIPAQUE IOHEXOL 300 MG/ML  SOLN

[Series 2: routine chest with · axial · 0.60mm/px · z∈[+1099,+1337]mm · 14 of 131 slices shown, 18 images]
[im 6/131  mediastinal]
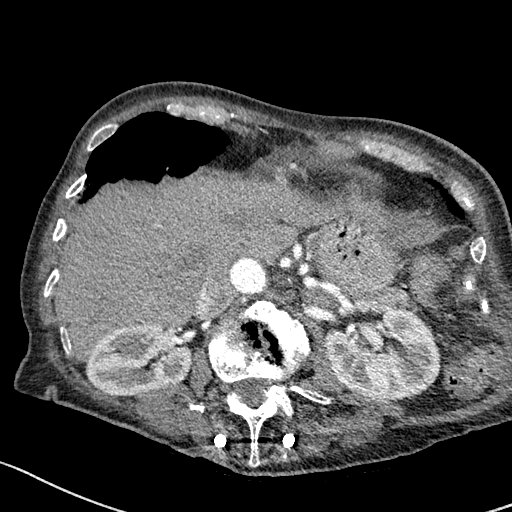
[im 6/131  lung]
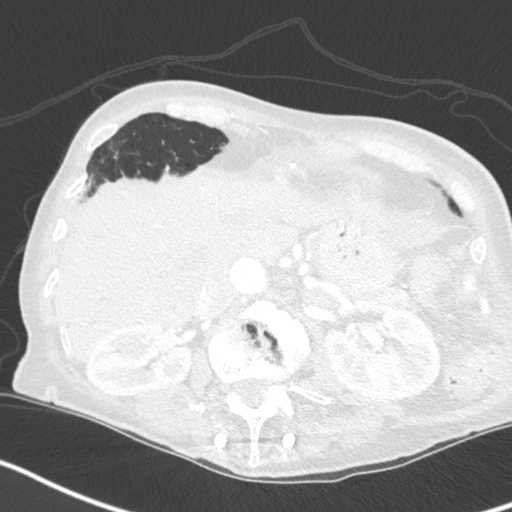
[im 18/131  lung]
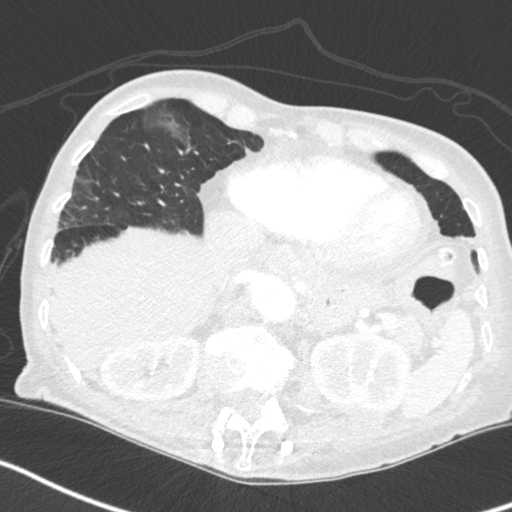
[im 24/131  lung]
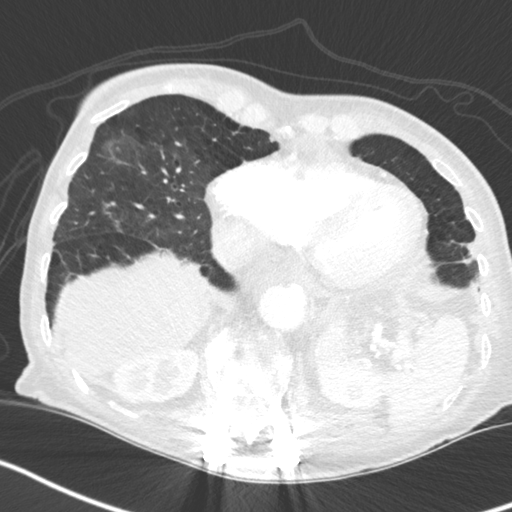
[im 36/131  lung]
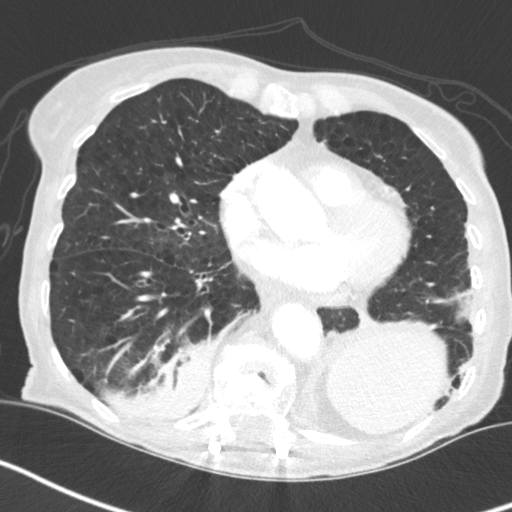
[im 42/131  mediastinal]
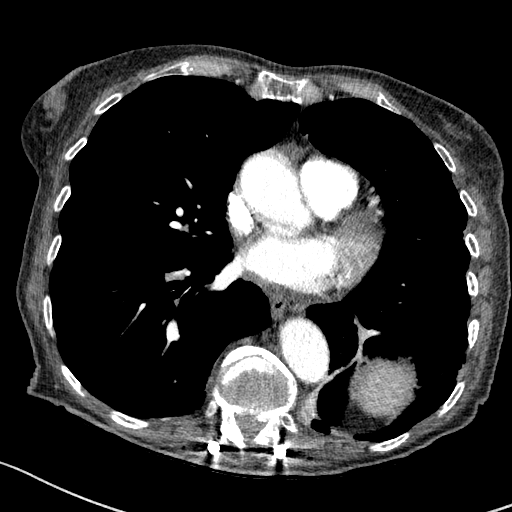
[im 42/131  lung]
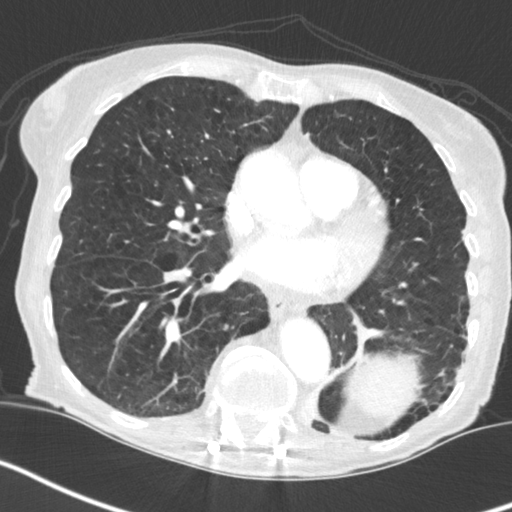
[im 54/131  lung]
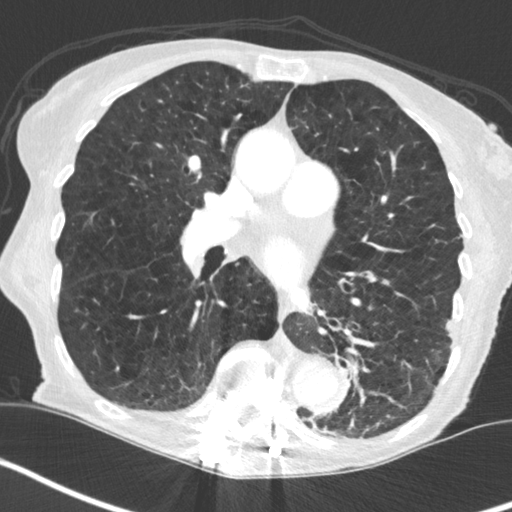
[im 60/131  lung]
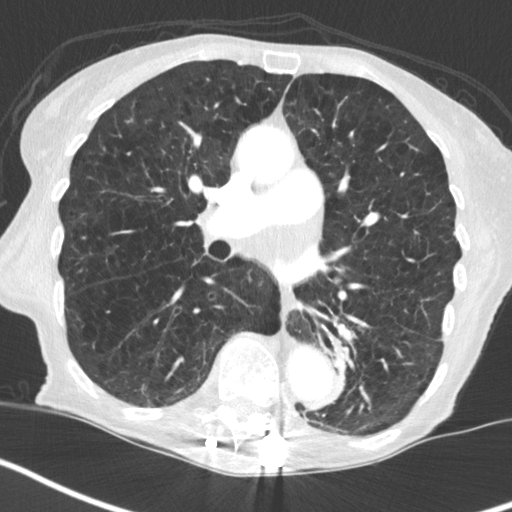
[im 71/131  lung]
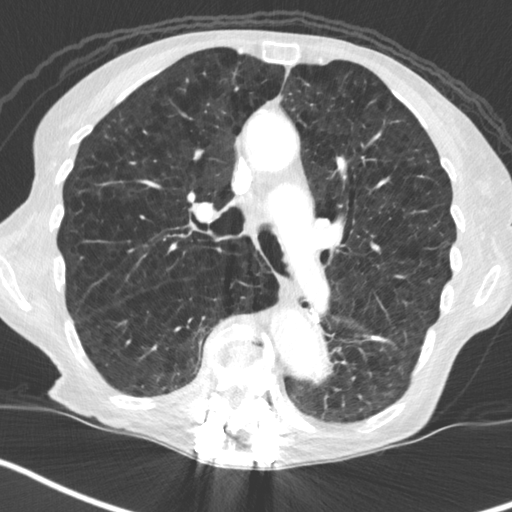
[im 77/131  mediastinal]
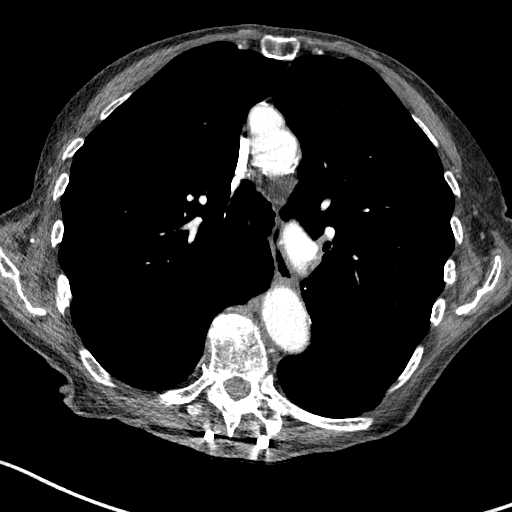
[im 77/131  lung]
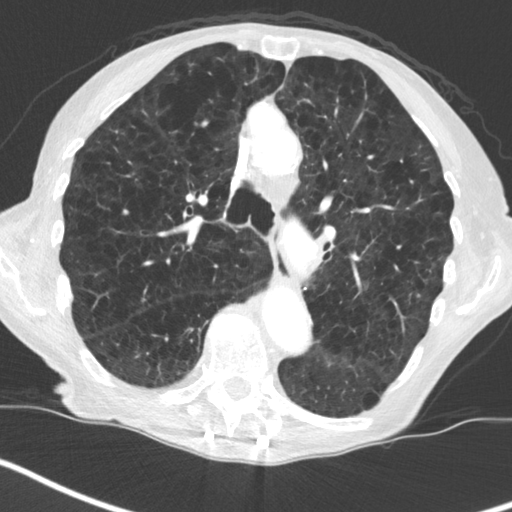
[im 89/131  lung]
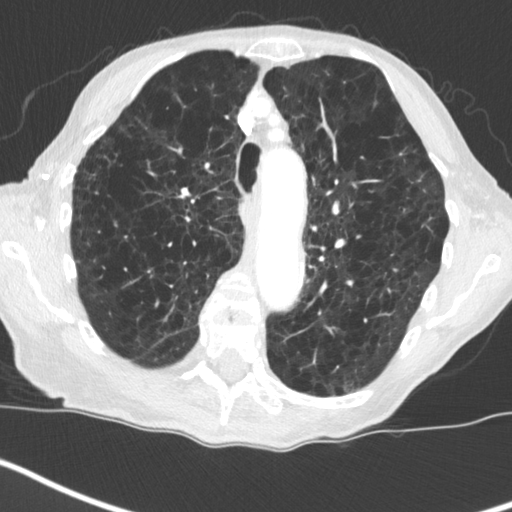
[im 95/131  lung]
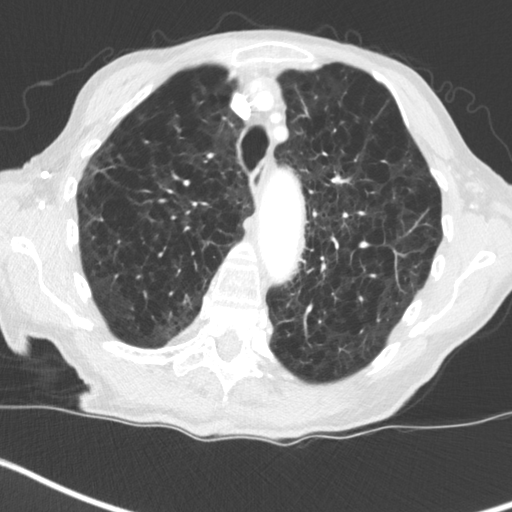
[im 107/131  lung]
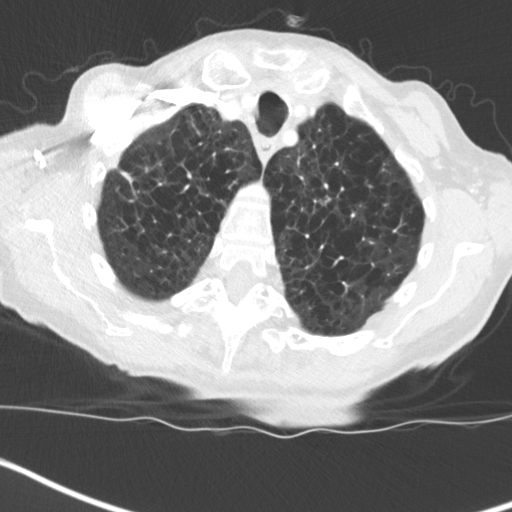
[im 113/131  mediastinal]
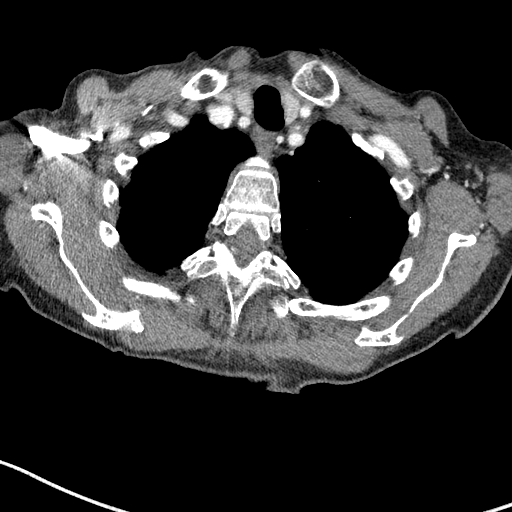
[im 113/131  lung]
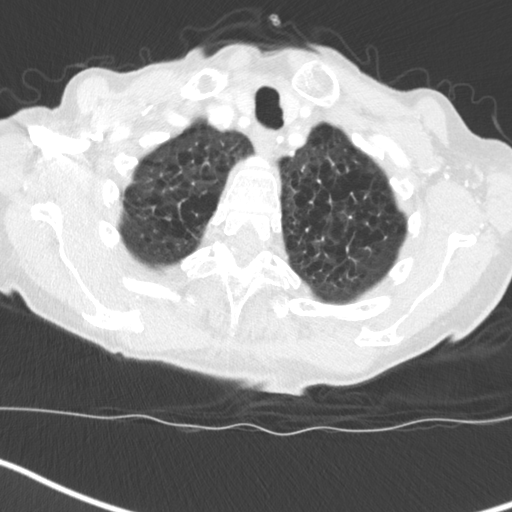
[im 125/131  lung]
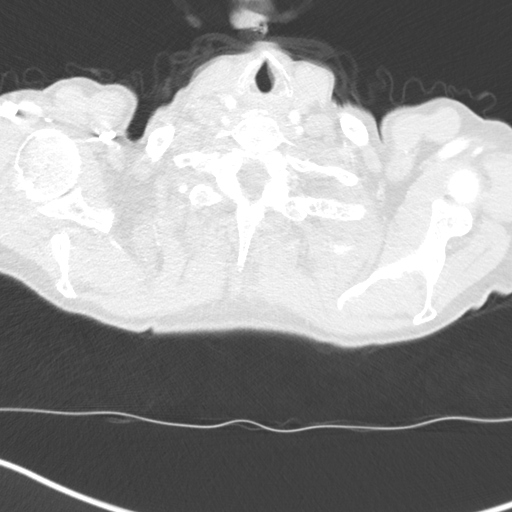

[Series 6: coronal · coronal · 0.58mm/px · 1 of 134 slices shown]
[im 67/134  lung]
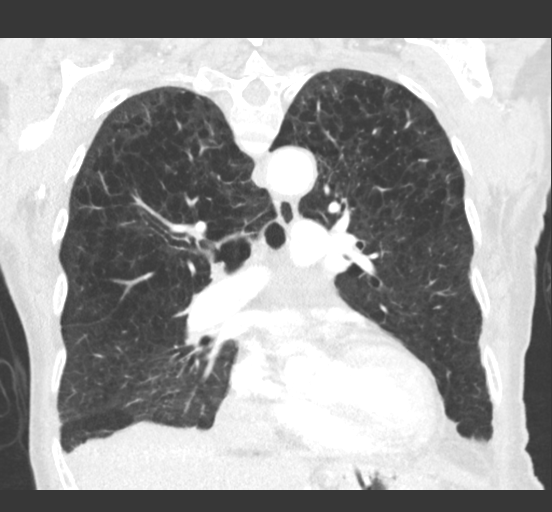

[15 of 36 positions shown; findings below may reference images not displayed]

FINDINGS: Cardiovascular: Atherosclerotic calcification of the aorta, aortic
valve and coronary arteries. Heart is at the upper limits of normal
in size to mildly enlarged. Small amount of anterior pericardial
fluid appears similar.

Mediastinum/Nodes: No pathologically enlarged mediastinal, hilar or
axillary lymph nodes. Esophagus is grossly unremarkable.

Lungs/Pleura: Centrilobular emphysema. Postoperative scarring in the
right upper and left lower lobes. Mild scarring in the lung bases.
Lungs are otherwise clear. No pleural fluid. Airway is unremarkable.

Upper Abdomen: Visualized portions of the liver and adrenal glands
are unremarkable. A stone is seen in the right kidney. Visualized
portions of the kidneys, spleen, pancreas, stomach and bowel are
otherwise unremarkable.

Musculoskeletal: Degenerative and postoperative changes in the
spine. Multiple thoracolumbar compression deformities, unchanged.
Old right rib fracture. No worrisome lytic or sclerotic lesions.
IMPRESSION: 1. No evidence of recurrent or metastatic disease.
2. Right renal stone.
3. Aortic atherosclerosis ([id number]). Coronary artery
calcification.
4.  Emphysema ([id number]).

## 2022-12-07 IMAGING — MR MR HEAD WO/W CM
12 series · 48 of 48 positions shown · IV contrast (multihance)
Comparison: MRI head 11/03/2020

CLINICAL DATA: Metastatic lung cancer.  Assess treatment response.

EXAM:
MRI HEAD WITHOUT AND WITH CONTRAST
TECHNIQUE: Multiplanar, multiecho pulse sequences of the brain and surrounding
structures were obtained without and with intravenous contrast.
CONTRAST:  11mL MULTIHANCE GADOBENATE DIMEGLUMINE 529 MG/ML IV SOLN

[Series 2: FLAIR · sagittal · 3.0mm · 0.75mm/px · 3 of 39 slices shown (1 of 2)]
[im 1/39]
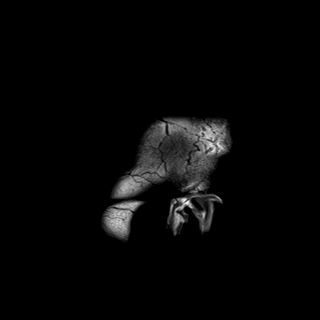
[im 20/39]
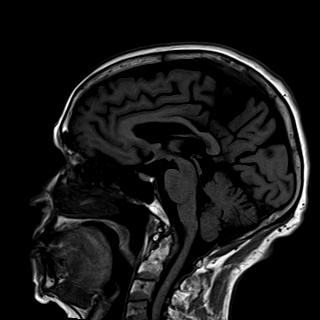
[im 39/39]
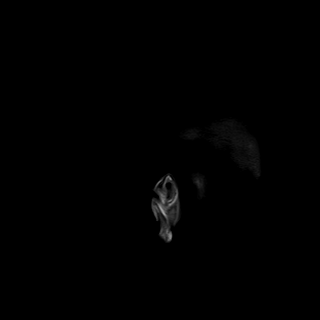

[Series 3: DWI · axial · 3.0mm · 1.50mm/px · z∈[-94,+51]mm · 4 of 78 slices shown (1 of 2)]
[im 1/78]
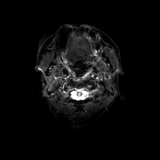
[im 26/78]
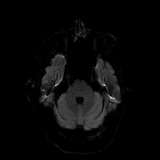
[im 52/78]
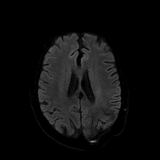
[im 78/78]
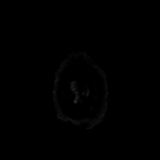

[Series 4: DWI · axial · 3.0mm · 1.50mm/px · z∈[-94,+51]mm · 2 of 39 slices shown (2 of 2)]
[im 1/39]
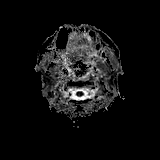
[im 39/39]
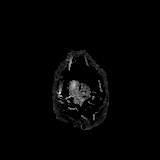

[Series 5: T2 · axial · 5.0mm · 0.57mm/px · 1 of 27 slices shown (1 of 2)]
[im 1/27]
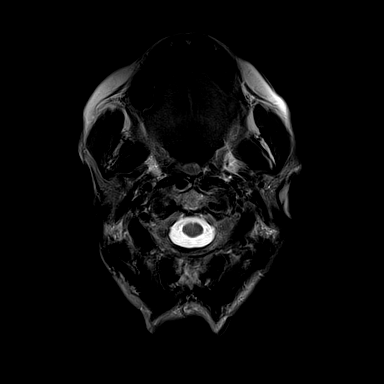

[Series 6: swi_images · axial · 1.5mm · 0.90mm/px · z∈[-89,+50]mm · 5 of 96 slices shown]
[im 1/96]
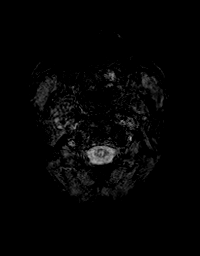
[im 24/96]
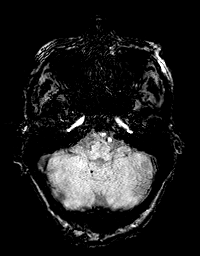
[im 48/96]
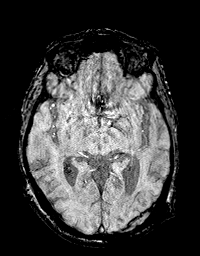
[im 72/96]
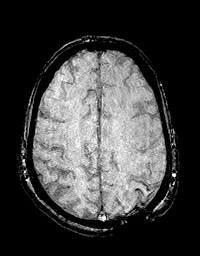
[im 96/96]
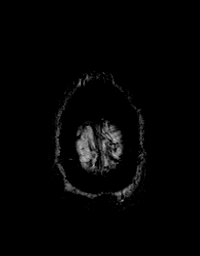

[Series 8: FLAIR · axial · 3.0mm · 0.86mm/px · z∈[-116,+49]mm · 3 of 56 slices shown (2 of 2)]
[im 1/56]
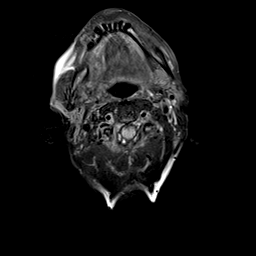
[im 28/56]
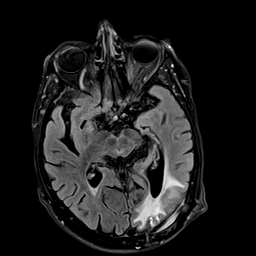
[im 56/56]
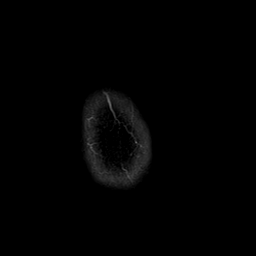

[Series 9: T2 · axial · non-contrast · 1.0mm · 0.86mm/px · z∈[-112,+44]mm · 8 of 160 slices shown (2 of 2)]
[im 1/160]
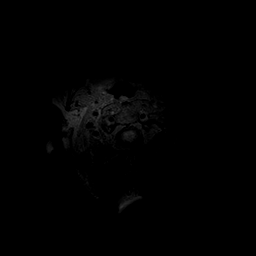
[im 23/160]
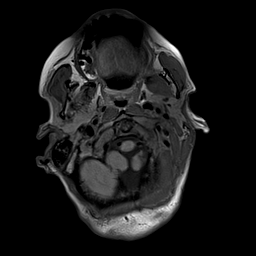
[im 46/160]
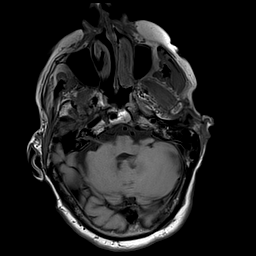
[im 69/160]
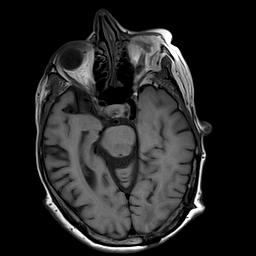
[im 91/160]
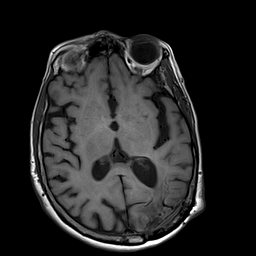
[im 114/160]
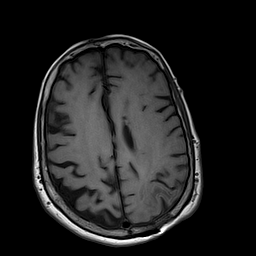
[im 137/160]
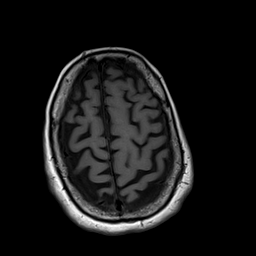
[im 160/160]
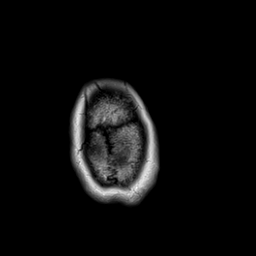

[Series 10: T2 post-contrast · coronal · 3.0mm · 0.57mm/px · 2 of 47 slices shown (1 of 2)]
[im 1/47]
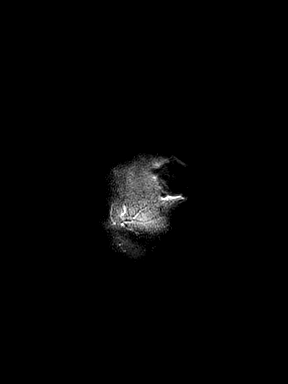
[im 47/47]
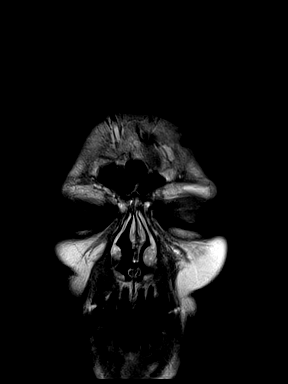

[Series 11: T2 post-contrast · axial · 1.0mm · 0.86mm/px · z∈[-112,+44]mm · 8 of 160 slices shown (2 of 2)]
[im 1/160]
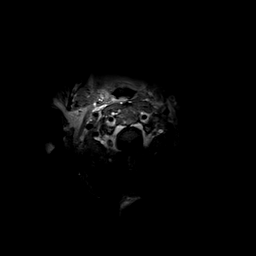
[im 23/160]
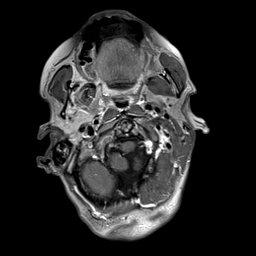
[im 46/160]
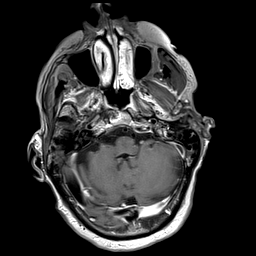
[im 69/160]
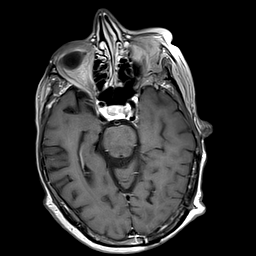
[im 91/160]
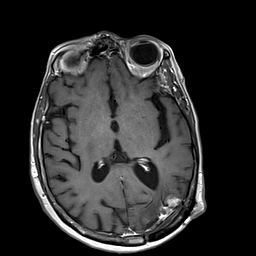
[im 114/160]
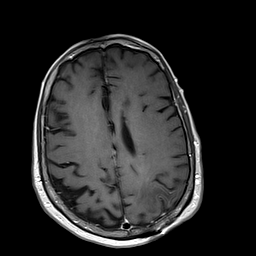
[im 137/160]
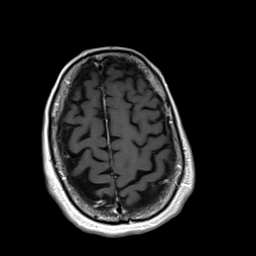
[im 160/160]
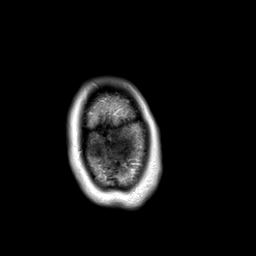

[Series 12: T1 post-contrast · axial · 1.0mm · 0.75mm/px · z∈[-113,+46]mm · 8 of 160 slices shown (1 of 2)]
[im 1/160]
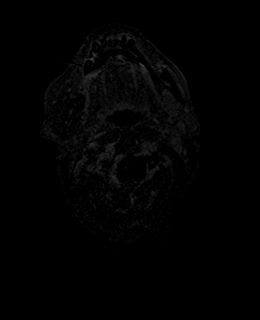
[im 23/160]
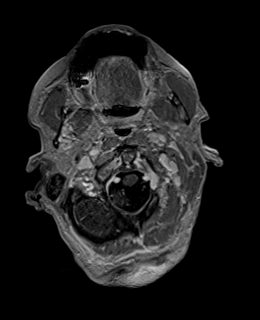
[im 46/160]
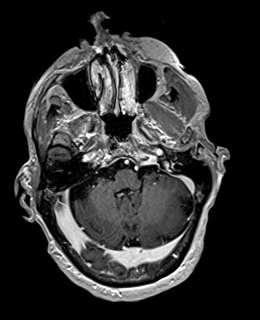
[im 69/160]
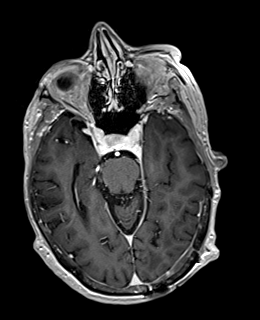
[im 91/160]
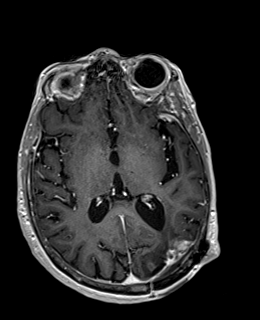
[im 114/160]
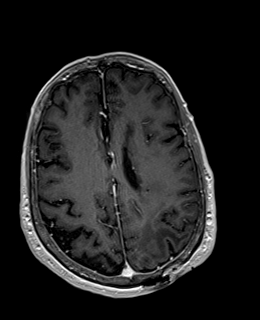
[im 137/160]
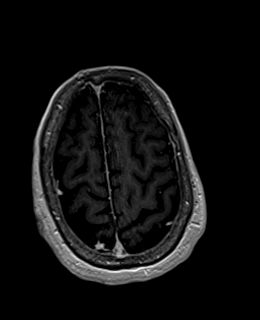
[im 160/160]
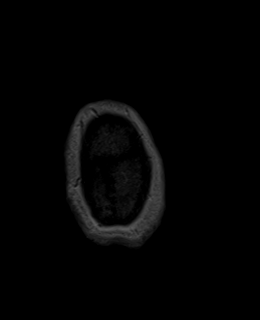

[Series 13: T1 post-contrast · coronal · 3.0mm · 0.57mm/px · 2 of 47 slices shown (2 of 2)]
[im 1/47]
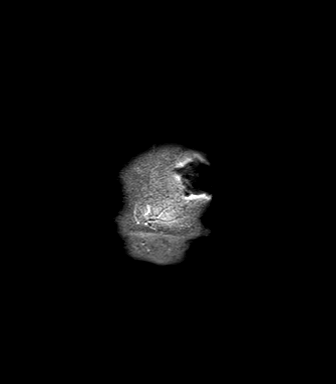
[im 47/47]
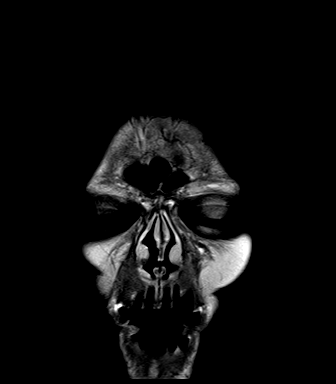

[Series 14: FLAIR post-contrast · sagittal · 3.0mm · 0.75mm/px · 2 of 39 slices shown]
[im 1/39]
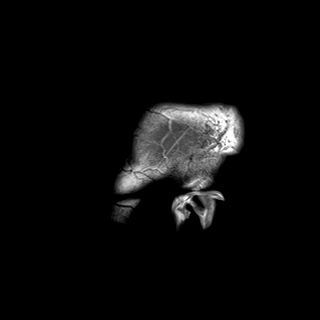
[im 39/39]
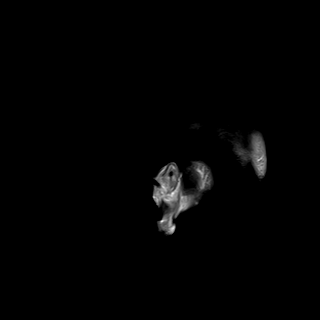

[48 of 48 positions shown; findings below may reference images not displayed]

FINDINGS: Brain: Left occipital parietal craniotomy. Enhancing lesion in the
left occipital parietal lobe is unchanged. This is located
peripherally and is at the site of prior surgery. There is irregular
enhancement with chronic hemorrhage which is stable. Surrounding
FLAIR hyperintensity in the white matter also stable.

No new lesion identified.  Ventricle size normal.  No acute infarct.

Vascular: Normal arterial flow voids. Small developmental venous
anomaly in the right cerebellum.

Skull and upper cervical spine: Left occipital parietal craniotomy.
No worrisome skull lesion.

Sinuses/Orbits: Paranasal sinuses clear. Bilateral cataract
extraction

Other: None
IMPRESSION: Stable MRI. Postop resection and radiation to left occipital
parietal lesion. Peripheral irregular enhancement and surrounding
white matter hyperintensity is stable from the prior study. Possible
treatment related enhancement. No new lesions.

## 2022-12-18 IMAGING — CT CT CHEST-ABD-PELV W/ CM
3 of 5 series · 14 of 36 positions shown, 16 images · IV contrast (Omnipaque or Isovue)
Comparison: CT chest, 10/19/2020, 06/22/2020, CT abdomen pelvis,
12/07/2019

CLINICAL DATA: Metastatic left lower lobe lung cancer, assess
treatment response

EXAM:
CT CHEST, ABDOMEN, AND PELVIS WITH CONTRAST
TECHNIQUE: Multidetector CT imaging of the chest, abdomen and pelvis was
performed following the standard protocol during bolus
administration of intravenous contrast.
CONTRAST:  100mL OMNIPAQUE IOHEXOL 300 MG/ML SOLN, additional oral
enteric contrast

[Series 2: cap with · axial · 0.84mm/px · z∈[+960,+1396]mm · 9 of 109 slices shown, 11 images]
[im 11/109  mediastinal]
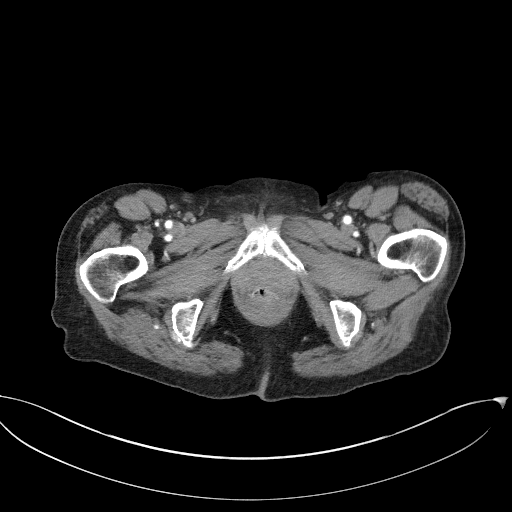
[im 11/109  bone]
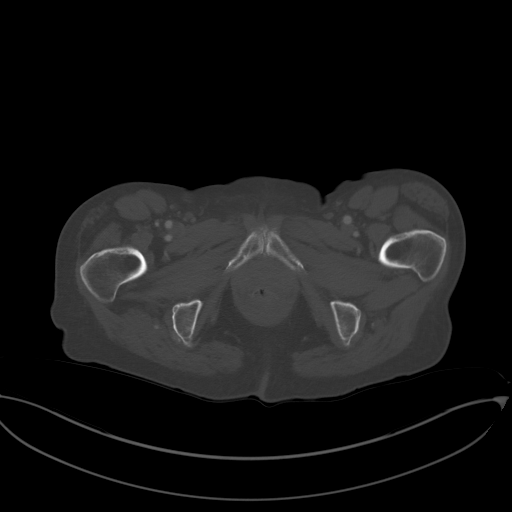
[im 22/109  mediastinal]
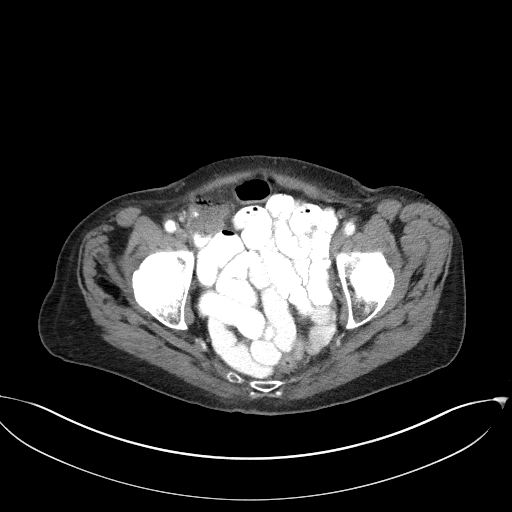
[im 33/109  mediastinal]
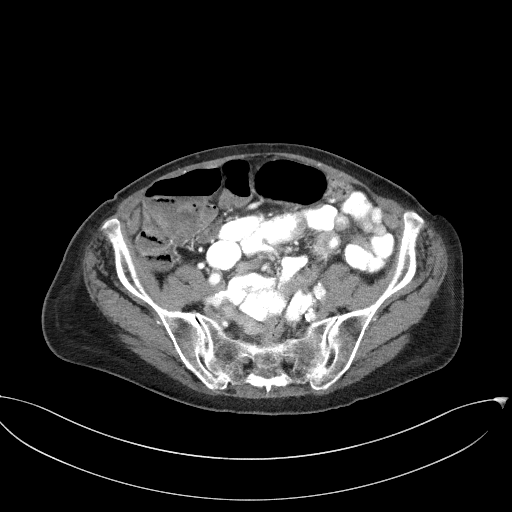
[im 44/109  mediastinal]
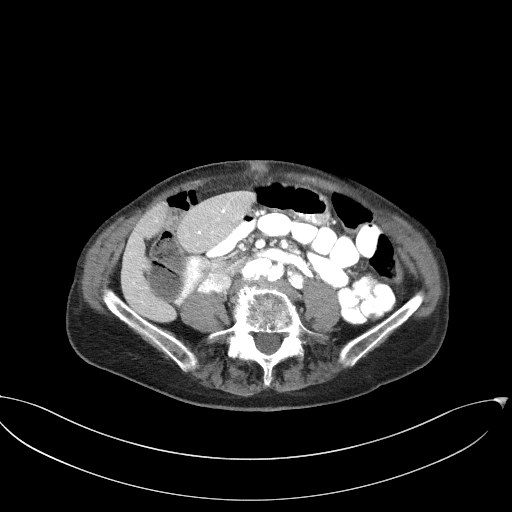
[im 55/109  mediastinal]
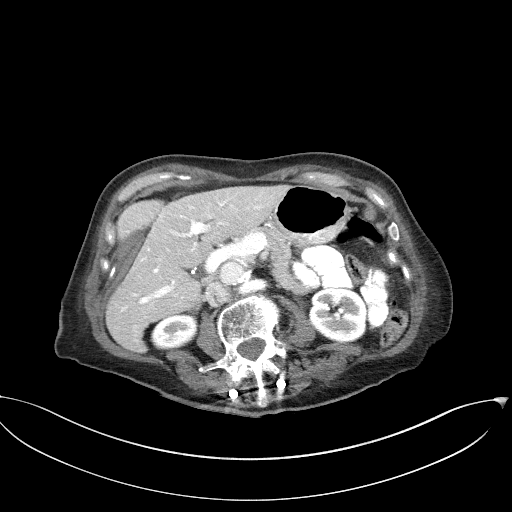
[im 65/109  mediastinal]
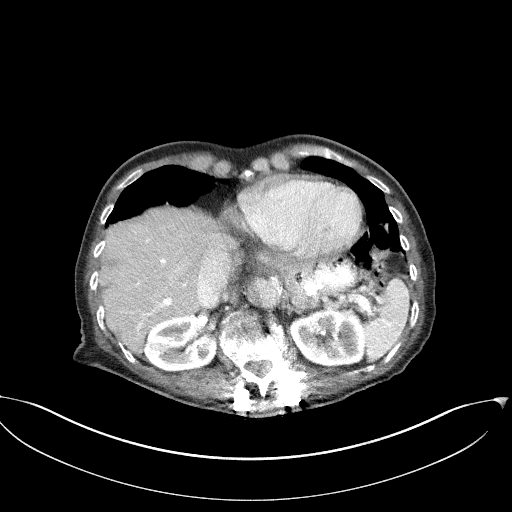
[im 76/109  mediastinal]
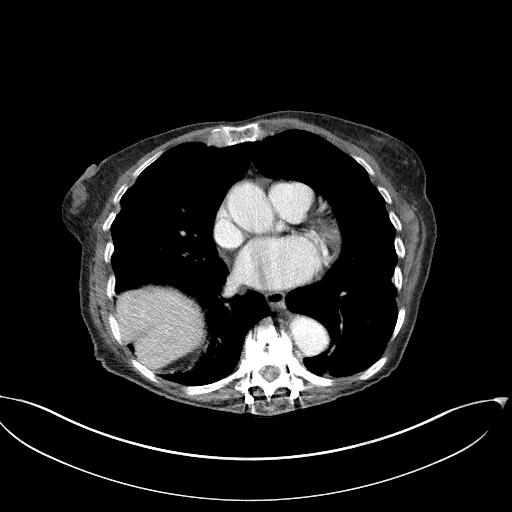
[im 87/109  mediastinal]
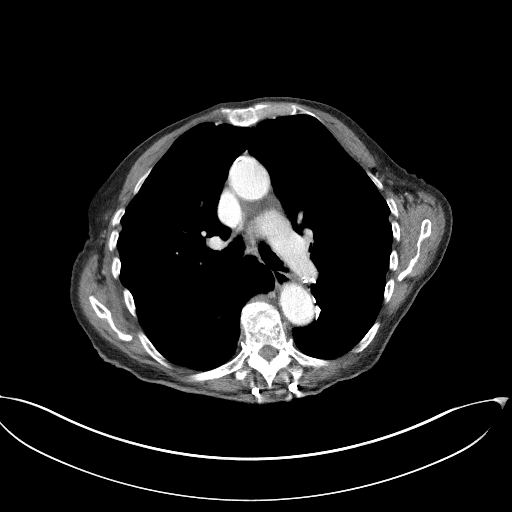
[im 98/109  mediastinal]
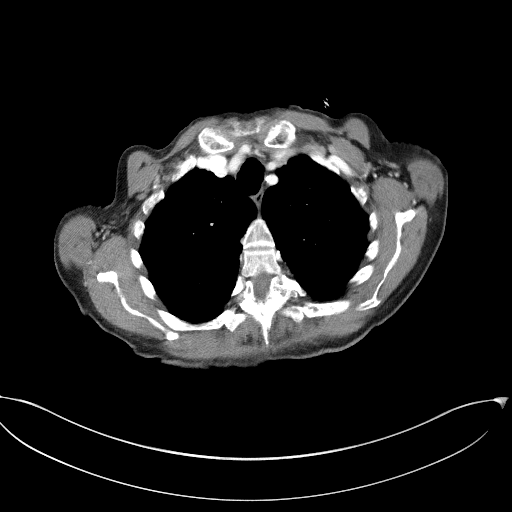
[im 98/109  bone]
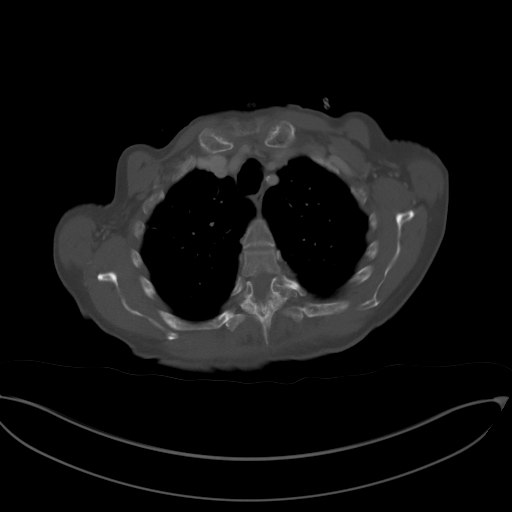

[Series 3: lung · axial · 0.84mm/px · z∈[+1200,+1242]mm · 2 of 139 slices shown]
[im 11/139  bone]
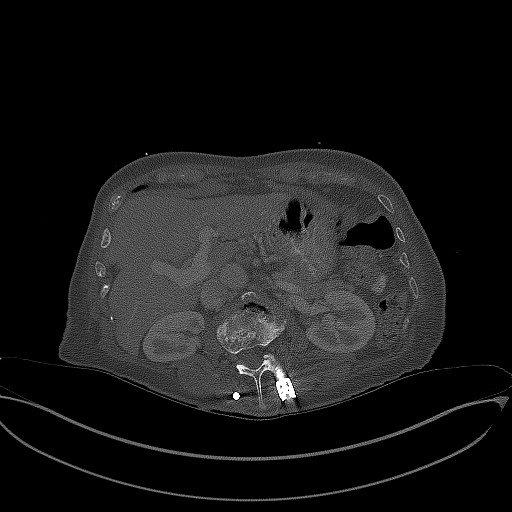
[im 32/139  bone]
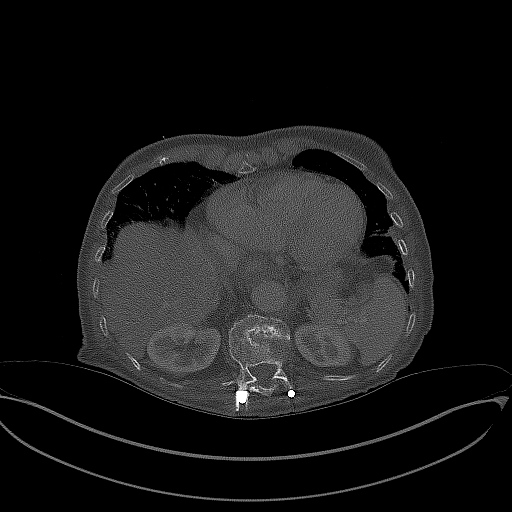

[Series 4: coronals · coronal · 0.75mm/px · 3 of 144 slices shown]
[im 29/144  mediastinal]
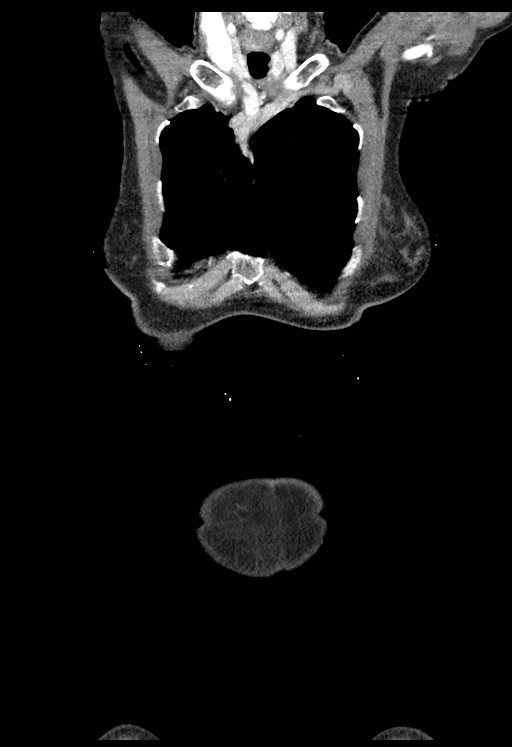
[im 58/144  mediastinal]
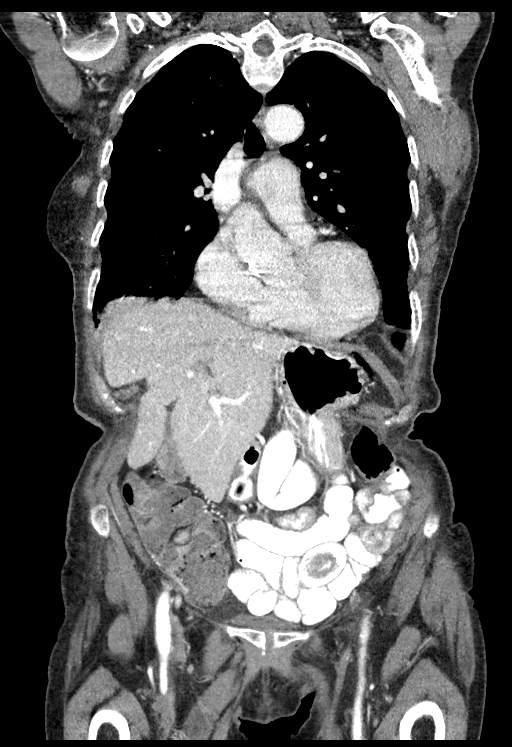
[im 86/144  mediastinal]
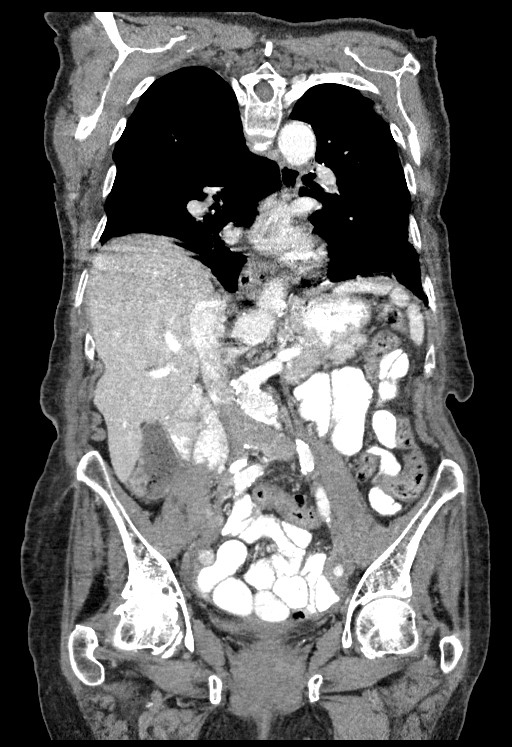

[14 of 36 positions shown; findings below may reference images not displayed]

FINDINGS: CT CHEST FINDINGS

Cardiovascular: Aortic atherosclerosis. Normal heart size. Scattered
coronary artery calcifications. No pericardial effusion.

Mediastinum/Nodes: No enlarged mediastinal, hilar, or axillary lymph
nodes. Thyroid gland, trachea, and esophagus demonstrate no
significant findings.

Lungs/Pleura: Severe centrilobular emphysema. Redemonstrated
postoperative findings of left lower lobe superior segmentectomy. No
pleural effusion or pneumothorax.

Musculoskeletal: No chest wall mass or suspicious bone lesions
identified. Osteopenia. Numerous wedge deformities of the thoracic
spine, not significantly changed, notable for high-grade deformities
of T6 T9 T12, and posterior thoracolumbar fusion from T10 through
L2.

CT ABDOMEN PELVIS FINDINGS

Hepatobiliary: No focal liver abnormality is seen. Status post
cholecystectomy. No biliary dilatation.

Pancreas: Unremarkable. No pancreatic ductal dilatation or
surrounding inflammatory changes.

Spleen: Normal in size without significant abnormality.

Adrenals/Urinary Tract: Adrenal glands are unremarkable. Kidneys are
normal, without renal calculi, solid lesion, or hydronephrosis.
Bladder is unremarkable.

Stomach/Bowel: Stomach is within normal limits. Appendix is not
clearly visualized. No evidence of bowel wall thickening,
distention, or inflammatory changes.

Vascular/Lymphatic: Aortic atherosclerosis. Tortuous abdominal aorta
with infrarenal abdominal aortic aneurysm measuring up to 3.4 x
cm (series 4, image 80). Chronic, calcified dissection and aneurysm
of the right common iliac artery, measuring up to 2.2 x 1.9 cm
(series 4, image 83). No enlarged abdominal or pelvic lymph nodes.

Reproductive: Status post hysterectomy.

Other: No abdominal wall hernia or abnormality. No abdominopelvic
ascites.

Musculoskeletal: No acute or significant osseous findings.
IMPRESSION: 1. Redemonstrated postoperative findings of left lower lobe superior
segmentectomy.
2. There are no distinctly appreciated pulmonary nodules remaining
in the left lower lobe.
3. No evidence of recurrent or metastatic disease in the chest,
abdomen, or pelvis.
4. Tortuous abdominal aorta with infrarenal abdominal aortic
aneurysm measuring up to 3.4 x 3.1 cm. Chronic, calcified dissection
and aneurysm of the right common iliac artery, measuring up to 2.2 x
1.9 cm. Recommend follow-up ultrasound every 3 years if not
otherwise imaged. This recommendation follows ACR consensus
guidelines: White Paper of the ACR Incidental Findings Committee II
on Vascular Findings. [HOSPITAL] 5819; [DATE].
5. Coronary artery disease.
6. Emphysema.

Aortic Atherosclerosis (DBJUE-1A1.1) and Emphysema ([id number].P).

## 2023-04-18 ENCOUNTER — Telehealth: Payer: PPO
